# Patient Record
Sex: Male | Born: 1965
Health system: Southern US, Community
[De-identification: ages and names within clinical notes are randomized; demographics above are authoritative.]

## PROBLEM LIST (undated history)

## (undated) DIAGNOSIS — F1921 Other psychoactive substance dependence, in remission: Secondary | ICD-10-CM

## (undated) DIAGNOSIS — N183 Chronic kidney disease, stage 3 unspecified: Secondary | ICD-10-CM

## (undated) DIAGNOSIS — E119 Type 2 diabetes mellitus without complications: Secondary | ICD-10-CM

## (undated) DIAGNOSIS — M1990 Unspecified osteoarthritis, unspecified site: Secondary | ICD-10-CM

## (undated) DIAGNOSIS — F259 Schizoaffective disorder, unspecified: Secondary | ICD-10-CM

## (undated) DIAGNOSIS — Z87442 Personal history of urinary calculi: Secondary | ICD-10-CM

## (undated) DIAGNOSIS — F988 Other specified behavioral and emotional disorders with onset usually occurring in childhood and adolescence: Secondary | ICD-10-CM

## (undated) DIAGNOSIS — G709 Myoneural disorder, unspecified: Secondary | ICD-10-CM

## (undated) DIAGNOSIS — J90 Pleural effusion, not elsewhere classified: Secondary | ICD-10-CM

## (undated) DIAGNOSIS — E291 Testicular hypofunction: Secondary | ICD-10-CM

## (undated) DIAGNOSIS — F4310 Post-traumatic stress disorder, unspecified: Secondary | ICD-10-CM

## (undated) DIAGNOSIS — IMO0002 Reserved for concepts with insufficient information to code with codable children: Secondary | ICD-10-CM

## (undated) DIAGNOSIS — R319 Hematuria, unspecified: Secondary | ICD-10-CM

## (undated) DIAGNOSIS — J189 Pneumonia, unspecified organism: Secondary | ICD-10-CM

## (undated) DIAGNOSIS — Z8489 Family history of other specified conditions: Secondary | ICD-10-CM

## (undated) DIAGNOSIS — R7989 Other specified abnormal findings of blood chemistry: Secondary | ICD-10-CM

## (undated) DIAGNOSIS — M5416 Radiculopathy, lumbar region: Secondary | ICD-10-CM

## (undated) DIAGNOSIS — F0390 Unspecified dementia without behavioral disturbance: Secondary | ICD-10-CM

## (undated) DIAGNOSIS — F319 Bipolar disorder, unspecified: Secondary | ICD-10-CM

## (undated) DIAGNOSIS — F329 Major depressive disorder, single episode, unspecified: Secondary | ICD-10-CM

## (undated) DIAGNOSIS — F32A Depression, unspecified: Secondary | ICD-10-CM

## (undated) DIAGNOSIS — T50912A Poisoning by multiple unspecified drugs, medicaments and biological substances, intentional self-harm, initial encounter: Secondary | ICD-10-CM

## (undated) DIAGNOSIS — R296 Repeated falls: Secondary | ICD-10-CM

## (undated) DIAGNOSIS — I10 Essential (primary) hypertension: Secondary | ICD-10-CM

## (undated) DIAGNOSIS — K760 Fatty (change of) liver, not elsewhere classified: Secondary | ICD-10-CM

## (undated) DIAGNOSIS — T7840XA Allergy, unspecified, initial encounter: Secondary | ICD-10-CM

## (undated) DIAGNOSIS — F1910 Other psychoactive substance abuse, uncomplicated: Secondary | ICD-10-CM

## (undated) DIAGNOSIS — R3129 Other microscopic hematuria: Secondary | ICD-10-CM

## (undated) DIAGNOSIS — M1611 Unilateral primary osteoarthritis, right hip: Secondary | ICD-10-CM

## (undated) DIAGNOSIS — R42 Dizziness and giddiness: Secondary | ICD-10-CM

## (undated) DIAGNOSIS — M1612 Unilateral primary osteoarthritis, left hip: Secondary | ICD-10-CM

## (undated) DIAGNOSIS — F419 Anxiety disorder, unspecified: Secondary | ICD-10-CM

## (undated) HISTORY — DX: Testicular hypofunction: E29.1

## (undated) HISTORY — DX: Other psychoactive substance abuse, uncomplicated: F19.10

## (undated) HISTORY — DX: Depression, unspecified: F32.A

## (undated) HISTORY — DX: Anxiety disorder, unspecified: F41.9

## (undated) HISTORY — DX: Other specified behavioral and emotional disorders with onset usually occurring in childhood and adolescence: F98.8

## (undated) HISTORY — PX: BACK SURGERY: SHX140

## (undated) HISTORY — DX: Allergy, unspecified, initial encounter: T78.40XA

## (undated) HISTORY — DX: Repeated falls: R29.6

## (undated) HISTORY — DX: Essential (primary) hypertension: I10

## (undated) HISTORY — DX: Myoneural disorder, unspecified: G70.9

## (undated) HISTORY — DX: Other psychoactive substance dependence, in remission: F19.21

## (undated) HISTORY — DX: Dizziness and giddiness: R42

## (undated) HISTORY — PX: LUMBAR DISC SURGERY: SHX700

## (undated) HISTORY — DX: Radiculopathy, lumbar region: M54.16

## (undated) HISTORY — DX: Schizoaffective disorder, unspecified: F25.9

## (undated) HISTORY — DX: Other microscopic hematuria: R31.29

## (undated) HISTORY — DX: Major depressive disorder, single episode, unspecified: F32.9

## (undated) HISTORY — PX: TONSILLECTOMY: SUR1361

## (undated) HISTORY — PX: CLOSED REDUCTION METACARPAL WITH PERCUTANEOUS PINNING: SHX5613

---

## 1997-10-21 ENCOUNTER — Emergency Department (HOSPITAL_COMMUNITY): Admission: EM | Admit: 1997-10-21 | Discharge: 1997-10-21 | Payer: Self-pay | Admitting: Emergency Medicine

## 1998-06-24 ENCOUNTER — Encounter: Payer: Self-pay | Admitting: Emergency Medicine

## 1998-06-24 ENCOUNTER — Emergency Department (HOSPITAL_COMMUNITY): Admission: EM | Admit: 1998-06-24 | Discharge: 1998-06-24 | Payer: Self-pay | Admitting: Emergency Medicine

## 1999-11-05 ENCOUNTER — Emergency Department (HOSPITAL_COMMUNITY): Admission: EM | Admit: 1999-11-05 | Discharge: 1999-11-05 | Payer: Self-pay | Admitting: Emergency Medicine

## 2000-02-21 ENCOUNTER — Emergency Department (HOSPITAL_COMMUNITY): Admission: EM | Admit: 2000-02-21 | Discharge: 2000-02-22 | Payer: Self-pay | Admitting: Emergency Medicine

## 2000-02-22 ENCOUNTER — Encounter: Payer: Self-pay | Admitting: Emergency Medicine

## 2000-02-23 ENCOUNTER — Inpatient Hospital Stay (HOSPITAL_COMMUNITY): Admission: EM | Admit: 2000-02-23 | Discharge: 2000-02-24 | Payer: Self-pay | Admitting: Emergency Medicine

## 2000-02-23 ENCOUNTER — Encounter: Payer: Self-pay | Admitting: Neurosurgery

## 2000-07-20 ENCOUNTER — Emergency Department (HOSPITAL_COMMUNITY): Admission: EM | Admit: 2000-07-20 | Discharge: 2000-07-20 | Payer: Self-pay | Admitting: Emergency Medicine

## 2000-07-20 ENCOUNTER — Encounter: Payer: Self-pay | Admitting: Emergency Medicine

## 2000-12-31 ENCOUNTER — Encounter: Admission: RE | Admit: 2000-12-31 | Discharge: 2001-03-31 | Payer: Self-pay | Admitting: Internal Medicine

## 2001-01-14 ENCOUNTER — Encounter: Admission: RE | Admit: 2001-01-14 | Discharge: 2001-01-14 | Payer: Self-pay | Admitting: Internal Medicine

## 2001-01-14 ENCOUNTER — Encounter: Payer: Self-pay | Admitting: Internal Medicine

## 2002-05-25 ENCOUNTER — Encounter: Payer: Self-pay | Admitting: Neurosurgery

## 2002-05-27 ENCOUNTER — Inpatient Hospital Stay (HOSPITAL_COMMUNITY): Admission: RE | Admit: 2002-05-27 | Discharge: 2002-05-30 | Payer: Self-pay | Admitting: Neurosurgery

## 2002-05-27 ENCOUNTER — Encounter: Payer: Self-pay | Admitting: Neurosurgery

## 2004-02-07 ENCOUNTER — Ambulatory Visit: Payer: Self-pay | Admitting: Internal Medicine

## 2004-02-15 ENCOUNTER — Ambulatory Visit (HOSPITAL_COMMUNITY): Admission: RE | Admit: 2004-02-15 | Discharge: 2004-02-15 | Payer: Self-pay | Admitting: Internal Medicine

## 2004-02-29 ENCOUNTER — Ambulatory Visit: Payer: Self-pay | Admitting: Psychology

## 2004-03-07 ENCOUNTER — Ambulatory Visit: Payer: Self-pay | Admitting: Internal Medicine

## 2004-06-05 ENCOUNTER — Ambulatory Visit: Payer: Self-pay | Admitting: Internal Medicine

## 2004-06-11 ENCOUNTER — Ambulatory Visit: Payer: Self-pay | Admitting: Internal Medicine

## 2004-08-14 ENCOUNTER — Ambulatory Visit: Payer: Self-pay | Admitting: Internal Medicine

## 2004-08-15 ENCOUNTER — Ambulatory Visit: Payer: Self-pay | Admitting: Internal Medicine

## 2004-08-21 ENCOUNTER — Ambulatory Visit: Payer: Self-pay | Admitting: Internal Medicine

## 2004-10-02 ENCOUNTER — Ambulatory Visit: Payer: Self-pay | Admitting: Internal Medicine

## 2004-12-04 ENCOUNTER — Ambulatory Visit: Payer: Self-pay | Admitting: Internal Medicine

## 2005-12-03 ENCOUNTER — Ambulatory Visit: Payer: Self-pay | Admitting: Internal Medicine

## 2005-12-09 ENCOUNTER — Ambulatory Visit (HOSPITAL_COMMUNITY): Admission: RE | Admit: 2005-12-09 | Discharge: 2005-12-09 | Payer: Self-pay | Admitting: Internal Medicine

## 2005-12-11 ENCOUNTER — Ambulatory Visit: Payer: Self-pay | Admitting: Internal Medicine

## 2005-12-18 ENCOUNTER — Ambulatory Visit: Payer: Self-pay | Admitting: Internal Medicine

## 2005-12-20 ENCOUNTER — Ambulatory Visit (HOSPITAL_COMMUNITY): Admission: RE | Admit: 2005-12-20 | Discharge: 2005-12-20 | Payer: Self-pay | Admitting: Internal Medicine

## 2005-12-26 ENCOUNTER — Encounter: Payer: Self-pay | Admitting: Internal Medicine

## 2006-09-22 ENCOUNTER — Ambulatory Visit: Payer: Self-pay | Admitting: Internal Medicine

## 2006-10-09 ENCOUNTER — Emergency Department (HOSPITAL_COMMUNITY): Admission: EM | Admit: 2006-10-09 | Discharge: 2006-10-09 | Payer: Self-pay | Admitting: Emergency Medicine

## 2006-12-05 ENCOUNTER — Encounter: Payer: Self-pay | Admitting: Internal Medicine

## 2006-12-05 DIAGNOSIS — I10 Essential (primary) hypertension: Secondary | ICD-10-CM

## 2006-12-05 DIAGNOSIS — F411 Generalized anxiety disorder: Secondary | ICD-10-CM | POA: Insufficient documentation

## 2006-12-11 ENCOUNTER — Ambulatory Visit: Payer: Self-pay | Admitting: *Deleted

## 2006-12-11 ENCOUNTER — Encounter (INDEPENDENT_AMBULATORY_CARE_PROVIDER_SITE_OTHER): Payer: Self-pay | Admitting: Internal Medicine

## 2006-12-11 ENCOUNTER — Ambulatory Visit: Payer: Self-pay | Admitting: Family Medicine

## 2006-12-11 LAB — CONVERTED CEMR LAB
ALT: 13 units/L (ref 0–53)
Albumin: 4.2 g/dL (ref 3.5–5.2)
Alkaline Phosphatase: 80 units/L (ref 39–117)
BUN: 14 mg/dL (ref 6–23)
Basophils Relative: 1 % (ref 0–1)
CO2: 27 meq/L (ref 19–32)
Chloride: 109 meq/L (ref 96–112)
HCT: 49.1 % (ref 39.0–52.0)
HDL: 29 mg/dL — ABNORMAL LOW (ref >39)
Hemoglobin: 17.4 g/dL — ABNORMAL HIGH (ref 13.0–17.0)
Lymphocytes Relative: 26 % (ref 12–46)
MCHC: 35.4 g/dL (ref 30.0–36.0)
MCV: 87.5 fL (ref 78.0–100.0)
Monocytes Relative: 8 % (ref 3–11)
Neutro Abs: 6.4 10*3/uL (ref 1.7–7.7)
Neutrophils Relative %: 65 % (ref 43–77)
Platelets: 185 10*3/uL (ref 150–400)
RBC: 5.61 M/uL (ref 4.22–5.81)
RDW: 13.3 % (ref 11.5–14.0)
Sodium: 143 meq/L (ref 135–145)
Total CHOL/HDL Ratio: 4.8
VLDL: 27 mg/dL (ref 0–40)

## 2007-01-01 ENCOUNTER — Ambulatory Visit: Payer: Self-pay | Admitting: Internal Medicine

## 2007-04-02 ENCOUNTER — Ambulatory Visit: Payer: Self-pay | Admitting: Internal Medicine

## 2007-04-19 ENCOUNTER — Ambulatory Visit: Payer: Self-pay | Admitting: Internal Medicine

## 2007-04-25 ENCOUNTER — Inpatient Hospital Stay (HOSPITAL_COMMUNITY): Admission: EM | Admit: 2007-04-25 | Discharge: 2007-05-04 | Payer: Self-pay | Admitting: Emergency Medicine

## 2007-04-25 ENCOUNTER — Ambulatory Visit: Payer: Self-pay | Admitting: Pulmonary Disease

## 2007-04-25 ENCOUNTER — Ambulatory Visit: Payer: Self-pay | Admitting: Cardiology

## 2007-04-27 ENCOUNTER — Encounter (INDEPENDENT_AMBULATORY_CARE_PROVIDER_SITE_OTHER): Payer: Self-pay | Admitting: Internal Medicine

## 2007-05-04 ENCOUNTER — Inpatient Hospital Stay (HOSPITAL_COMMUNITY): Admission: RE | Admit: 2007-05-04 | Discharge: 2007-05-07 | Payer: Self-pay | Admitting: Psychiatry

## 2007-05-04 ENCOUNTER — Ambulatory Visit: Payer: Self-pay | Admitting: Psychiatry

## 2009-06-06 ENCOUNTER — Emergency Department (HOSPITAL_COMMUNITY): Admission: EM | Admit: 2009-06-06 | Discharge: 2009-06-06 | Payer: Self-pay | Admitting: Emergency Medicine

## 2009-10-22 ENCOUNTER — Emergency Department (HOSPITAL_COMMUNITY): Admission: EM | Admit: 2009-10-22 | Discharge: 2009-10-22 | Payer: Self-pay | Admitting: Family Medicine

## 2010-01-18 ENCOUNTER — Ambulatory Visit: Payer: Self-pay | Admitting: Internal Medicine

## 2010-01-18 DIAGNOSIS — F329 Major depressive disorder, single episode, unspecified: Secondary | ICD-10-CM

## 2010-01-18 DIAGNOSIS — M25511 Pain in right shoulder: Secondary | ICD-10-CM

## 2010-01-18 DIAGNOSIS — R3129 Other microscopic hematuria: Secondary | ICD-10-CM | POA: Insufficient documentation

## 2010-01-18 DIAGNOSIS — F250 Schizoaffective disorder, bipolar type: Secondary | ICD-10-CM

## 2010-01-22 ENCOUNTER — Telehealth: Payer: Self-pay | Admitting: Internal Medicine

## 2010-01-22 LAB — CONVERTED CEMR LAB
AST: 36 units/L (ref 0–37)
Albumin: 4.7 g/dL (ref 3.5–5.2)
Basophils Relative: 0.6 % (ref 0.0–3.0)
Bilirubin Urine: NEGATIVE
Bilirubin, Direct: 0.1 mg/dL (ref 0.0–0.3)
Calcium: 9.5 mg/dL (ref 8.4–10.5)
Chloride: 104 meq/L (ref 96–112)
Eosinophils Relative: 1.8 % (ref 0.0–5.0)
GFR calc non Af Amer: 76.38 mL/min (ref >60)
Glucose, Bld: 130 mg/dL — ABNORMAL HIGH (ref 70–99)
HCT: 44.1 % (ref 39.0–52.0)
Hemoglobin: 15.3 g/dL (ref 13.0–17.0)
Ketones, ur: NEGATIVE mg/dL
Lymphs Abs: 2 10*3/uL (ref 0.7–4.0)
MCV: 92.1 fL (ref 78.0–100.0)
Monocytes Relative: 6.5 % (ref 3.0–12.0)
Nitrite: NEGATIVE
RBC: 4.79 M/uL (ref 4.22–5.81)
RDW: 13.1 % (ref 11.5–14.6)
Sodium: 140 meq/L (ref 135–145)
Specific Gravity, Urine: 1.015 (ref 1.000–1.030)
Total Bilirubin: 0.7 mg/dL (ref 0.3–1.2)
Total Protein, Urine: 30 mg/dL
Total Protein: 7.6 g/dL (ref 6.0–8.3)
pH: 6.5 (ref 5.0–8.0)

## 2010-05-20 ENCOUNTER — Telehealth: Payer: Self-pay | Admitting: Internal Medicine

## 2010-05-20 ENCOUNTER — Other Ambulatory Visit: Payer: Self-pay | Admitting: Internal Medicine

## 2010-05-20 ENCOUNTER — Ambulatory Visit
Admission: RE | Admit: 2010-05-20 | Discharge: 2010-05-20 | Payer: Self-pay | Source: Home / Self Care | Attending: Internal Medicine | Admitting: Internal Medicine

## 2010-05-20 DIAGNOSIS — F17200 Nicotine dependence, unspecified, uncomplicated: Secondary | ICD-10-CM | POA: Insufficient documentation

## 2010-05-20 DIAGNOSIS — E291 Testicular hypofunction: Secondary | ICD-10-CM | POA: Insufficient documentation

## 2010-05-20 DIAGNOSIS — R453 Demoralization and apathy: Secondary | ICD-10-CM | POA: Insufficient documentation

## 2010-05-20 LAB — TESTOSTERONE: Testosterone: 247.07 ng/dL — ABNORMAL LOW (ref 350.00–890.00)

## 2010-05-23 NOTE — Progress Notes (Signed)
Summary: FYI  Phone Note Outgoing Call   Call placed by: Jonathon Resides, [facility]),  January 22, 2010 11:57 AM Summary of Call: called pt to inform him of recent labs and I asked him if it was ok to sched urology appt.  He stated he has seen urologist in past and does not wish to see another urologist at this time.  He states microscopic hematuria is hereditary and normal for him  Follow-up for Phone Call        Noted Thx Follow-up by: [name] MD,  January 22, 2010 1:21 PM

## 2010-05-23 NOTE — Assessment & Plan Note (Signed)
Summary: LAST APPT W/DR AVP:  2008----OV---STC   Vital Signs:  Patient profile:   45 year old male Height:      77 inches Weight:      244 pounds BMI:     29.04 Temp:     98.4 degrees F oral Pulse rate:   92 / minute Pulse rhythm:   regular Resp:     16 per minute BP sitting:   150 / 98  (left arm) Cuff size:   regular  Vitals Entered By: Jonathon Resides, [facility]) (January 18, 2010 3:09 PM) CC: est PCP. c/o rt shoulder pain Is Patient Diabetic? No   CC:  est PCP. c/o rt shoulder pain.  History of Present Illness: New pt - re-est The patient presents for a preventive health examination   Preventive Screening-Counseling & Management  Alcohol-Tobacco     Alcohol drinks/day: 0     Smoking Status: current     Packs/Day: 1.0  Caffeine-Diet-Exercise     Does Patient Exercise: no  Comments: wants to quit  Current Medications (verified): 1)  Wellbutrin Sr 150 Mg Xr12h-Tab (Bupropion Hcl) .... 2 Once Daily 2)  Seroquel 300 Mg Tabs (Quetiapine Fumarate) .... At Bedtime 3)  Klonopin 2 Mg Tabs (Clonazepam) .Marland Kitchen.. 1 Three Times A Day  Allergies (verified): No Known Drug Allergies  Past History:  Past Medical History: Anxiety Hypertension Depression - bipolar [facility] ADD Microsc hematuria hereditory s/p Urol eval  Past Surgical History: Denies surgical history  Family History: M died of melanoma with mets F DM S cervical CA, DM  Social History: Occupation: Multimedia programmer Current Smoker Alcohol use-no Regular exercise-no Single Smoking Status:  current Packs/Day:  1.0 Does Patient Exercise:  no  Review of Systems       The patient complains of depression.  The patient denies anorexia, fever, weight loss, weight gain, vision loss, decreased hearing, hoarseness, chest pain, syncope, dyspnea on exertion, peripheral edema, prolonged cough, headaches, hemoptysis, abdominal pain, melena, hematochezia, severe indigestion/heartburn, hematuria,  incontinence, genital sores, muscle weakness, suspicious skin lesions, transient blindness, difficulty walking, unusual weight change, abnormal bleeding, enlarged lymph nodes, angioedema, and testicular masses.    Physical Exam  General:  Well-developed,well-nourished,in no acute distress; alert,appropriate and cooperative throughout examination Head:  Normocephalic and atraumatic without obvious abnormalities. No apparent alopecia or balding. Eyes:  No corneal or conjunctival inflammation noted. EOMI. Perrla. Ears:  External ear exam shows no significant lesions or deformities.  Otoscopic examination reveals clear canals, tympanic membranes are intact bilaterally without bulging, retraction, inflammation or discharge. Hearing is grossly normal bilaterally. Nose:  External nasal examination shows no deformity or inflammation. Nasal mucosa are pink and moist without lesions or exudates. Mouth:  Oral mucosa and oropharynx without lesions or exudates.  Teeth in good repair. Neck:  No deformities, masses, or tenderness noted. Lungs:  Normal respiratory effort, chest expands symmetrically. Lungs are clear to auscultation, no crackles or wheezes. Heart:  Normal rate and regular rhythm. S1 and S2 normal without gallop, murmur, click, rub or other extra sounds. Abdomen:  Bowel sounds positive,abdomen soft and non-tender without masses, organomegaly or hernias noted. Msk:  No deformity or scoliosis noted of thoracic or lumbar spine.   Pulses:  R and L carotid,radial,femoral,dorsalis pedis and posterior tibial pulses are full and equal bilaterally Extremities:  No clubbing, cyanosis, edema, or deformity noted with normal full range of motion of all joints.   Neurologic:  No cranial nerve deficits noted. Station and gait are normal. Plantar reflexes  are down-going bilaterally. DTRs are symmetrical throughout. Sensory, motor and coordinative functions appear intact. Skin:  Intact without suspicious lesions or  rashes Cervical Nodes:  No lymphadenopathy noted Inguinal Nodes:  No significant adenopathy Psych:  Cognition and judgment appear intact. Alert and cooperative with normal attention span and concentration. No apparent delusions, illusions, hallucinations   Impression & Recommendations:  Problem # 1:  HEALTH MAINTENANCE EXAM (ICD-V70.0) Assessment New Health and age related issues were discussed. Available screening tests and vaccinations were discussed as well. Healthy life style including good diet and exercise was discussed.  Orders: T-Vitamin D (25-Hydroxy) [phone]) TLB-B12, Serum-Total ONLY [id number]) TLB-BMP (Basic Metabolic Panel-BMET) (99991111) TLB-CBC Platelet - w/Differential (85025-CBCD) TLB-Hepatic/Liver Function Pnl (80076-HEPATIC) TLB-Lipid Panel (80061-LIPID) TLB-TSH (Thyroid Stimulating Hormone) (84443-TSH) TLB-Udip ONLY (81003-UDIP)  Problem # 2:  SHOULDER PAIN (ICD-719.41) R bicip tendonitis Assessment: New Pennsaid  Problem # 3:  HYPERTENSION (ICD-401.9) Assessment: Comment Only  Orders: T-Vitamin D (25-Hydroxy) [phone]) TLB-B12, Serum-Total ONLY [id number]) TLB-BMP (Basic Metabolic Panel-BMET) (99991111) TLB-CBC Platelet - w/Differential (85025-CBCD) TLB-Hepatic/Liver Function Pnl (80076-HEPATIC) TLB-Lipid Panel (80061-LIPID) TLB-TSH (Thyroid Stimulating Hormone) (84443-TSH) TLB-Udip ONLY (81003-UDIP)  Problem # 4:  ANXIETY (ICD-300.00) Assessment: Unchanged  His updated medication list for this problem includes:    Wellbutrin Sr 150 Mg Xr12h-tab (Bupropion hcl) .Marland Kitchen... 2 once daily    Klonopin 2 Mg Tabs (Clonazepam) .Marland Kitchen... 1 three times a day    Hydroxyzine Hcl 25 Mg Tabs (Hydroxyzine hcl) .Marland Kitchen... 1-2 at hs as needed insomnia  Problem # 5:  ELEVATED BLOOD PRESSURE (ICD-796.2) Assessment: Comment Only Call if >130/85  Problem # 6:  BIPOLAR DISORDER UNSPECIFIED (ICD-296.80) Assessment: Unchanged On the regimen of medicine(s)  reflected in the chart    Problem # 7:  MICROSCOPIC HEMATURIA (ICD-599.72) s/p Urol w/up Assessment: Comment Only see lab report addendum and tel note  Complete Medication List: 1)  Wellbutrin Sr 150 Mg Xr12h-tab (Bupropion hcl) .... 2 once daily 2)  Seroquel 300 Mg Tabs (Quetiapine fumarate) .... At bedtime 3)  Klonopin 2 Mg Tabs (Clonazepam) .Marland Kitchen.. 1 three times a day 4)  Vitamin D 1000 Unit Tabs (Cholecalciferol) .Marland Kitchen.. 1 by mouth qd 5)  Hydroxyzine Hcl 25 Mg Tabs (Hydroxyzine hcl) .Marland Kitchen.. 1-2 at hs as needed insomnia 6)  Pennsaid 1.5 % Soln (Diclofenac sodium) .... 3-5 gtt on skin three times a day for pain 7)  Vitamin B-12 500 Mcg Tabs (Cyanocobalamin) .Marland Kitchen.. 1 by mouth once daily  Other Orders: Admin 1st Vaccine [id number]) Flu Vaccine 37yrs + [id number])  Patient Instructions: 1)  Please schedule a follow-up appointment in 6 months. 2)  Call if BP is up Prescriptions: PENNSAID 1.5 % SOLN (DICLOFENAC SODIUM) 3-5 gtt on skin three times a day for pain  #1 x 3   Entered and Authorized by:   [name] MD   Signed by:   [name] MD on 01/18/2010   Method used:   Print then Give to Patient   RxID:   [id number] HYDROXYZINE HCL 25 MG TABS (HYDROXYZINE HCL) 1-2 at hs as needed insomnia  #60 x 3   Entered and Authorized by:   [name] MD   Signed by:   [name] MD on 01/18/2010   Method used:   Print then Give to Patient   RxID:   [id number] KLONOPIN 2 MG TABS (CLONAZEPAM) 1 three times a day  #90 x 3   Entered and Authorized by:   [name] MD   Signed by:  [name] MD on 01/18/2010   Method used:   Print then Give to Patient   RxID:   [id number] SEROQUEL 300 MG TABS (QUETIAPINE FUMARATE) at bedtime  #30 x 6   Entered and Authorized by:   [name] MD   Signed by:   [name] MD on 01/18/2010   Method used:   Print then Give to Patient   RxID:   [id number] WELLBUTRIN SR 150 MG XR12H-TAB  (BUPROPION HCL) 2 once daily  #60 x 6   Entered and Authorized by:   [name] MD   Signed by:   [name] MD on 01/18/2010   Method used:   Print then Give to Patient   RxIDIP:[id number]   .lbflu   Flu Vaccine Consent Questions     Do you have a history of severe allergic reactions to this vaccine? no    Any prior history of allergic reactions to egg and/or gelatin? no    Do you have a sensitivity to the preservative Thimersol? no    Do you have a past history of Guillan-Barre Syndrome? no    Do you currently have an acute febrile illness? no    Have you ever had a severe reaction to latex? no    Vaccine information given and explained to patient? yes    Are you currently pregnant? no    Lot Number:[id number]   Exp Date:10/19/2010   Site Given  Right Deltoid IM Jonathon Resides, [facility])  January 18, 2010 3:39 PM

## 2010-05-29 NOTE — Miscellaneous (Signed)
Summary: [facility]   Imported By: Phillis Knack 05/24/2010 08:55:42  _____________________________________________________________________  External Attachment:    Type:   Image     Comment:   External Document

## 2010-05-29 NOTE — Progress Notes (Signed)
Summary: Chantix not covered  Phone Note Call from Patient   Summary of Call: Pt was given coupon for chantix but no rx. Needs to go to [facility]. Initial call taken by: Charlsie Quest, [place],  May 20, 2010 3:26 PM  Follow-up for Phone Call        Is he going to take it? Read info.  Wait x 1 month after you switch to Olanzapine before you try Chantix?  Follow-up by: [name] MD,  May 20, 2010 6:19 PM  Additional Follow-up for Phone Call Additional follow up Details #1::        left mess for pt to call back to advise of above and append on labs Additional Follow-up by: Jonathon Resides, [facility]),  May 21, 2010 9:32 AM    Additional Follow-up for Phone Call Additional follow up Details #2::    left mess for pt to call back  Jonathon Resides, [facility])  May 22, 2010 8:27 AM   Additional Follow-up for Phone Call Additional follow up Details #3:: Details for Additional Follow-up Action Taken: pt informed of above and states he will use nicotine patch for now because Chantix is not covered by ins.  Noted. Do it no sooner than in 2 wks after you switch to Olanzepine. Thank you!   Pt informed.............Marland KitchenCharlsie Quest, CMA  May 22, 2010 5:29 PM  Additional Follow-up by: Jonathon Resides, [facility]),  May 22, 2010 4:59 PM

## 2010-05-29 NOTE — Assessment & Plan Note (Signed)
Summary: f/u appt/cortisone injection/cd   Vital Signs:  Patient profile:   45 year old male Height:      77 inches Weight:      256 pounds BMI:     30.47 Temp:     98.8 degrees F oral Pulse rate:   104 / minute Pulse rhythm:   regular Resp:     16 per minute BP sitting:   112 / 88  (left arm) Cuff size:   regular  Vitals Entered By: [name], CMA(AAMA) (May 20, 2010 2:15 PM)  Procedure Note  Injections: The patient complains of pain. Indication: chronic pain Consent signed: yes  Procedure # 1: trigger point injection    Region: anterior    Location: R deltoid    Technique: 25 g needle    Medication: 40 mg depomedrol    Anesthesia: 3.0 ml 1% lidocaine w/o epinephrine    Comment: Risks including but not limited by incomplete procedure, bleeding, infection, recurrence were discussed with the patient. Consent form was signed. 3 points wer injected with 1/3 of the mix. Tolerated well. Complicatons - none. Good pain relief following the procedure.   Cleaned and prepped with: alcohol and betadine Wound dressing: bandaid  CC: f/u c/o Rt shoulder pain, Rt foot pain Is Patient Diabetic? No Comments pt states Wellbutrin is no longer working and he needs cheaper alt for Seroquel   CC:  f/u c/o Rt shoulder pain and Rt foot pain.  History of Present Illness: The patient presents for a follow up of anxiety, depression. C/o apathy and fatigue. He wants to stop smoking. He is c/o R shoulder pain worse with movements, lifting. C/o Seroquel cost - needs to switch to smthg else...  Current Medications (verified): 1)  Wellbutrin Sr 150 Mg Xr12h-Tab (Bupropion Hcl) .... 2 Once Daily 2)  Seroquel 300 Mg Tabs (Quetiapine Fumarate) .... At Bedtime 3)  Klonopin 2 Mg Tabs (Clonazepam) .Marland Kitchen.. 1 Three Times A Day 4)  Vitamin D 1000 Unit Tabs (Cholecalciferol) .Marland Kitchen.. 1 By Mouth Qd 5)  Hydroxyzine Hcl 25 Mg Tabs (Hydroxyzine Hcl) .Marland Kitchen.. 1-2 At [facility] As Needed Insomnia 6)  Pennsaid 1.5 % Soln  (Diclofenac Sodium) .... 3-5 Gtt On Skin Three Times A Day For Pain 7)  Vitamin B-12 500 Mcg Tabs (Cyanocobalamin) .Marland Kitchen.. 1 By Mouth Once Daily  Allergies (verified): No Known Drug Allergies  Past History:  Past Medical History: Last updated: 01/18/2010 Anxiety Hypertension Depression - bipolar [name] s/p Urol eval  Social History: Last updated: 01/18/2010 Occupation: Multimedia programmer Current Smoker Alcohol use-no Regular exercise-no Single  Review of Systems       The patient complains of depression.  The patient denies fever, weight gain, chest pain, abdominal pain, melena, and muscle weakness.    Physical Exam  General:  Well-developed, in no acute distress; alert, appropriate and cooperative throughout examination overweight-appearing.   Head:  Normocephalic and atraumatic without obvious abnormalities. No apparent alopecia or balding. Nose:  External nasal examination shows no deformity or inflammation. Nasal mucosa are pink and moist without lesions or exudates. Mouth:  Oral mucosa and oropharynx without lesions or exudates.  Teeth in good repair. Lungs:  Normal respiratory effort, chest expands symmetrically. Lungs are clear to auscultation, no crackles or wheezes. Heart:  Normal rate and regular rhythm. S1 and S2 normal without gallop, murmur, click, rub or other extra sounds. Abdomen:  Bowel sounds positive,abdomen soft and non-tender without masses, organomegaly or hernias noted. Msk:  No  deformity or scoliosis noted of thoracic or lumbar spine.  R deltoid is tender in 3 points. Subacr space bicipital tendon are NT Extremities:  No clubbing, cyanosis, edema, or deformity noted with normal full range of motion of all joints.   Neurologic:  alert & oriented X3 and gait normal.   Skin:  Intact without suspicious lesions or rashes Psych:  Cognition and judgment appear intact. Alert and cooperative with normal attention span and  concentration. No apparent delusions, illusions, hallucinationsOriented X3, memory intact for recent and remote, good eye contact, not agitated, not suicidal, not homicidal, and flat affect.     Impression & Recommendations:  Problem # 1:  SHOULDER PAIN (ICD-719.41) R Assessment Deteriorated  Trigger point inj x 3  His updated medication list for this problem includes:    Naproxen 500 Mg Tabs (Naproxen) .Marland Kitchen... 1 by mouth two times a day pc for pain/arthritis    Cyclobenzaprine Hcl 5 Mg Tabs (Cyclobenzaprine hcl) .Marland Kitchen... 1 by mouth two times a day as needed spasm  Problem # 2:  BIPOLAR DISORDER UNSPECIFIED (ICD-296.80) Assessment: Unchanged Will switch to Zyprexa due to cost Risks vs benefits of Olanzepine use were discussed.   Problem # 3:  HYPERTENSION (ICD-401.9) Assessment: Improved  BP today: 112/88 Prior BP: 150/98 (01/18/2010)  Labs Reviewed: K+: 4.3 (01/18/2010) Creat: : 1.1 (01/18/2010)   Chol: 182 (01/18/2010)   HDL: 31.00 (01/18/2010)   LDL: 84 (12/11/2006)   TG: 258.0 (01/18/2010)  Problem # 4:  ANXIETY (ICD-300.00) Assessment: Unchanged  His updated medication list for this problem includes:    Wellbutrin Sr 150 Mg Xr12h-tab (Bupropion hcl) .Marland Kitchen... 2 once daily    Klonopin 2 Mg Tabs (Clonazepam) .Marland Kitchen... 1 three times a day    Hydroxyzine Hcl 25 Mg Tabs (Hydroxyzine hcl) .Marland Kitchen... 1-2 at hs as needed insomnia  Problem # 5:  DEMORALIZATION AND APATHY (ICD-799.25) Assessment: New Check testosterone level  Problem # 6:  HYPOGONADISM (ICD-257.2) Assessment: New Will see him back to discuss Rx - see tel note  Problem # 7:  TOBACCO USER (ICD-305.1) Assessment: Unchanged Risks vs benefits and controversies of Chantix use were discussed. See "Patient Instructions". See tel note. His updated medication list for this problem includes:    Chantix Starting Month Pak 0.5 Mg X 11 & 1 Mg X 42 Tabs (Varenicline tartrate) .Marland Kitchen... As dirrected    Chantix Continuing Month Pak 1 Mg Tabs  (Varenicline tartrate) .Marland Kitchen... As dirrected  Encouraged smoking cessation and discussed different methods for smoking cessation.   Complete Medication List: 1)  Wellbutrin Sr 150 Mg Xr12h-tab (Bupropion hcl) .... 2 once daily 2)  Klonopin 2 Mg Tabs (Clonazepam) .Marland Kitchen.. 1 three times a day 3)  Vitamin D 1000 Unit Tabs (Cholecalciferol) .Marland Kitchen.. 1 by mouth qd 4)  Hydroxyzine Hcl 25 Mg Tabs (Hydroxyzine hcl) .Marland Kitchen.. 1-2 at hs as needed insomnia 5)  Pennsaid 1.5 % Soln (Diclofenac sodium) .... 3-5 gtt on skin three times a day for pain 6)  Vitamin B-12 500 Mcg Tabs (Cyanocobalamin) .Marland Kitchen.. 1 by mouth once daily 7)  Olanzapine 15 Mg Tbdp (Olanzapine) .Marland Kitchen.. 1 by mouth qhs 8)  Naproxen 500 Mg Tabs (Naproxen) .Marland Kitchen.. 1 by mouth two times a day pc for pain/arthritis 9)  Cyclobenzaprine Hcl 5 Mg Tabs (Cyclobenzaprine hcl) .Marland Kitchen.. 1 by mouth two times a day as needed spasm 10)  Chantix Starting Month Pak 0.5 Mg X 11 & 1 Mg X 42 Tabs (Varenicline tartrate) .... As dirrected 11)  Chantix Continuing Month Pak  1 Mg Tabs (Varenicline tartrate) .... As dirrected  Other Orders: TLB-Testosterone, Total (84403-TESTO) Trigger Point Injection (1 or 2 muscles) [id number]) Depo- Medrol 40mg  (J1030)  Patient Instructions: 1)  Please schedule a follow-up appointment in 2 months. Prescriptions: CHANTIX CONTINUING MONTH PAK 1 MG TABS (VARENICLINE TARTRATE) as dirrected  #1 x 5   Entered and Authorized by:   [name] MD   Signed by:   [name] MD on 05/20/2010   Method used:   Print then Give to Patient   RxID:   [id number] [place] PAK 0.5 MG X 11 & 1 MG X 42 TABS (VARENICLINE TARTRATE) as dirrected  #1 x 0   Entered and Authorized by:   [name] MD   Signed by:   [name] MD on 05/20/2010   Method used:   Print then Give to Patient   RxID:   [id number] CYCLOBENZAPRINE HCL 5 MG TABS (CYCLOBENZAPRINE HCL) 1 by mouth two times a day as needed spasm  #60 x 3   Entered  and Authorized by:   [name] MD   Signed by:   [name] MD on 05/20/2010   Method used:   Print then Give to Patient   RxID:   [id number] NAPROXEN 500 MG TABS (NAPROXEN) 1 by mouth two times a day pc for pain/arthritis  #60 x 3   Entered and Authorized by:   [name] MD   Signed by:   [name] MD on 05/20/2010   Method used:   Print then Give to Patient   RxID:   [id number] KLONOPIN 2 MG TABS (CLONAZEPAM) 1 three times a day  #90 x 3   Entered and Authorized by:   [name] MD   Signed by:   [name] MD on 05/20/2010   Method used:   Print then Give to Patient   RxID:   [id number] OLANZAPINE 15 MG TBDP (OLANZAPINE) 1 by mouth qhs  #30 x 6   Entered and Authorized by:   [name] MD   Signed by:   [name] MD on 05/20/2010   Method used:   Print then Give to Patient   RxID:   [id number]    Orders Added: 1)  TLB-Testosterone, Total [84403-TESTO] 2)  Est. Patient Level V [id number] 3)  Trigger Point Injection (1 or 2 muscles) [id number] 4)  Depo- Medrol 40mg  [id number]

## 2010-06-10 ENCOUNTER — Encounter: Payer: Self-pay | Admitting: Internal Medicine

## 2010-06-10 ENCOUNTER — Ambulatory Visit (INDEPENDENT_AMBULATORY_CARE_PROVIDER_SITE_OTHER): Payer: BC Managed Care – PPO | Admitting: Internal Medicine

## 2010-06-10 DIAGNOSIS — F319 Bipolar disorder, unspecified: Secondary | ICD-10-CM

## 2010-06-10 DIAGNOSIS — Z733 Stress, not elsewhere classified: Secondary | ICD-10-CM

## 2010-06-10 DIAGNOSIS — R3129 Other microscopic hematuria: Secondary | ICD-10-CM

## 2010-06-10 DIAGNOSIS — E291 Testicular hypofunction: Secondary | ICD-10-CM

## 2010-06-12 ENCOUNTER — Telehealth (INDEPENDENT_AMBULATORY_CARE_PROVIDER_SITE_OTHER): Payer: Self-pay | Admitting: *Deleted

## 2010-06-13 ENCOUNTER — Encounter: Payer: Self-pay | Admitting: Internal Medicine

## 2010-06-18 NOTE — Medication Information (Signed)
Summary: Prior Authorization Approved for [employer] [place]  Prior Authorization Approved for [employer] [place] [employer] [place]   Imported By: Rise Patience 06/14/2010 15:28:01  _____________________________________________________________________  External Attachment:    Type:   Image     Comment:   External Document

## 2010-06-18 NOTE — Progress Notes (Signed)
Summary: PA-Androgel  Phone Note From Pharmacy   Summary of Call: PA-Androgel form to Dr [name] to complete. [name]  June 12, 2010 4:21 PM Faxed to [facility] @ [phone], waiting approval. [name]  June 13, 2010 8:20 AM PA approved 06/13/10 - 06/13/11, pt aware. Initial call taken by: [name],  June 13, 2010 8:45 AM

## 2010-06-18 NOTE — Letter (Signed)
Summary: Generic Letter  [facility]   [place], [place] [address]   Phone: [phone]  Fax: [phone]    06/10/2010  [id number] [address], [place]  [address]  [place]    Mr. Moulder has to have at least 7-8 hours of sleep between the shifts due to his medical condition and the medicines he has to take.           Sincerely,   Lew Dawes MD

## 2010-06-18 NOTE — Assessment & Plan Note (Signed)
Summary: f/u appt per stacy/#   Vital Signs:  Patient profile:   45 year old male Height:      77 inches Weight:      256 pounds BMI:     30.47 Temp:     98.9 degrees F oral Pulse rate:   88 / minute Pulse rhythm:   regular Resp:     16 per minute BP sitting:   146 / 90  (left arm) Cuff size:   regular  Vitals Entered By: Jonathon Resides, CMA(AAMA) (June 10, 2010 3:55 PM) CC: f/u to discuss low testosterone Is Patient Diabetic? No   CC:  f/u to discuss low testosterone.  History of Present Illness: The patient presents for a follow up of abn labs and with c/o  back pain, anxiety, depression. He has not been getting enough sleep.  Current Medications (verified): 1)  Wellbutrin Sr 150 Mg Xr12h-Tab (Bupropion Hcl) .... 2 Once Daily 2)  Klonopin 2 Mg Tabs (Clonazepam) .Marland Kitchen.. 1 Three Times A Day 3)  Vitamin D 1000 Unit Tabs (Cholecalciferol) .Marland Kitchen.. 1 By Mouth Qd 4)  Hydroxyzine Hcl 25 Mg Tabs (Hydroxyzine Hcl) .Marland Kitchen.. 1-2 At [facility] As Needed Insomnia 5)  Pennsaid 1.5 % Soln (Diclofenac Sodium) .... 3-5 Gtt On Skin Three Times A Day For Pain 6)  Vitamin B-12 500 Mcg Tabs (Cyanocobalamin) .Marland Kitchen.. 1 By Mouth Once Daily 7)  Olanzapine 15 Mg Tbdp (Olanzapine) .Marland Kitchen.. 1 By Mouth Qhs 8)  Naproxen 500 Mg Tabs (Naproxen) .Marland Kitchen.. 1 By Mouth Two Times A Day Pc For Pain/arthritis 9)  Cyclobenzaprine Hcl 5 Mg Tabs (Cyclobenzaprine Hcl) .Marland Kitchen.. 1 By Mouth Two Times A Day As Needed Spasm 10)  Chantix Starting Month Pak 0.5 Mg X 11 & 1 Mg X 42 Tabs (Varenicline Tartrate) .... As Dirrected 11)  Chantix Continuing Month Pak 1 Mg Tabs (Varenicline Tartrate) .... As Dirrected  Allergies (verified): No Known Drug Allergies  Past History:  Past Medical History: Anxiety Hypertension Depression - bipolar [place] ADD Microsc hematuria hereditory s/p Urol eval Hypogonadism  Family History: Reviewed history from 01/18/2010 and no changes required. M died of melanoma with mets F DM S cervical CA,  DM  Social History: Reviewed history from 01/18/2010 and no changes required. Occupation: Multimedia programmer Current Smoker Alcohol use-no Regular exercise-no Single  Review of Systems       The patient complains of depression.  The patient denies anorexia, weight loss, chest pain, dyspnea on exertion, abdominal pain, and severe indigestion/heartburn.    Physical Exam  General:  Well-developed, in no acute distress; alert, appropriate and cooperative throughout examination overweight-appearing.   Eyes:  No corneal or conjunctival inflammation noted. EOMI. Perrla. Mouth:  Oral mucosa and oropharynx without lesions or exudates.  Teeth in good repair. Neck:  No deformities, masses, or tenderness noted. Lungs:  Normal respiratory effort, chest expands symmetrically. Lungs are clear to auscultation, no crackles or wheezes. Heart:  Normal rate and regular rhythm. S1 and S2 normal without gallop, murmur, click, rub or other extra sounds. Abdomen:  Bowel sounds positive,abdomen soft and non-tender without masses, organomegaly or hernias noted. Msk:  No deformity or scoliosis noted of thoracic or lumbar spine.  R deltoid is tender in 3 points. Subacr space bicipital tendon are NT Extremities:  No clubbing, cyanosis, edema, or deformity noted with normal full range of motion of all joints.   Neurologic:  alert & oriented X3 and gait normal.   Skin:  Intact without suspicious lesions or rashes Psych:  Cognition and judgment appear intact. Alert and cooperative with normal attention span and concentration. No apparent delusions, illusions, hallucinationsOriented X3, memory intact for recent and remote, good eye contact, not agitated, not suicidal, not homicidal, and flat affect.     Impression & Recommendations:  Problem # 1:  HYPOGONADISM (ICD-257.2) Assessment New Options to treat were discussed.  The labs were reviewed with the patient.  Risks vs benefits and controversies of a long term  testosterone use were discussed.  See "Patient Instructions". On the regimen of medicine(s) reflected in the chart    Problem # 2:  STRESS DISORDER (ICD-V62.89) Assessment: Unchanged  Problem # 3:  BIPOLAR DISORDER UNSPECIFIED (ICD-296.80) Assessment: Unchanged  Problem # 4:  HYPERTENSION (ICD-401.9) Assessment: Deteriorated  BP today: 146/90 Prior BP: 112/88 (05/20/2010)  Labs Reviewed: K+: 4.3 (01/18/2010) Creat: : 1.1 (01/18/2010)   Chol: 182 (01/18/2010)   HDL: 31.00 (01/18/2010)   LDL: 84 (12/11/2006)   TG: 258.0 (01/18/2010)  Problem # 5:  MICROSCOPIC HEMATURIA (ICD-599.72) chronic Assessment: Comment Only  Complete Medication List: 1)  Wellbutrin Sr 150 Mg Xr12h-tab (Bupropion hcl) .... 2 once daily 2)  Klonopin 2 Mg Tabs (Clonazepam) .Marland Kitchen.. 1 three times a day 3)  Vitamin D 1000 Unit Tabs (Cholecalciferol) .Marland Kitchen.. 1 by mouth qd 4)  Hydroxyzine Hcl 25 Mg Tabs (Hydroxyzine hcl) .Marland Kitchen.. 1-2 at hs as needed insomnia 5)  Pennsaid 1.5 % Soln (Diclofenac sodium) .... 3-5 gtt on skin three times a day for pain 6)  Vitamin B-12 500 Mcg Tabs (Cyanocobalamin) .Marland Kitchen.. 1 by mouth once daily 7)  Olanzapine 15 Mg Tbdp (Olanzapine) .Marland Kitchen.. 1 by mouth qhs 8)  Naproxen 500 Mg Tabs (Naproxen) .Marland Kitchen.. 1 by mouth two times a day pc for pain/arthritis 9)  Cyclobenzaprine Hcl 5 Mg Tabs (Cyclobenzaprine hcl) .Marland Kitchen.. 1 by mouth two times a day as needed spasm 10)  Androgel Pump 1.6 % Gel (testosterone)  .... Use 2 pumps on skin q am  Patient Instructions: 1)  Look up Androgel, Testim and Testosterone injections 2)  Please schedule a follow-up appointment in 3 months. 3)  BMP prior to visit, ICD-9: 4)  Hepatic Panel prior to visit, ICD-9:995.20 5)  Testosterone 257.20  995.20 6)  CBC w/ Diff prior to visit, ICD-9: 7)  PSA prior to visit, ICD-9: 8)  HbgA1C prior to visit, ICD-9:790.29 Prescriptions: ANDROGEL PUMP 1.6 % GEL (TESTOSTERONE) use 2 pumps on skin q am  #1 x 12   Entered and Authorized by:   [name] MD   Signed by:   [name] MD on 06/10/2010   Method used:   Print then Give to Patient   RxID:   [id number]    Orders Added: 1)  Est. Patient Level IV [id number]

## 2010-07-10 LAB — POCT I-STAT, CHEM 8
BUN: 21 mg/dL (ref 6–23)
Calcium, Ion: 1.14 mmol/L (ref 1.12–1.32)
Chloride: 112 mEq/L (ref 96–112)
Creatinine, Ser: 1.2 mg/dL (ref 0.4–1.5)
Glucose, Bld: 132 mg/dL — ABNORMAL HIGH (ref 70–99)
Hemoglobin: 12.9 g/dL — ABNORMAL LOW (ref 13.0–17.0)

## 2010-07-10 LAB — URINALYSIS, ROUTINE W REFLEX MICROSCOPIC
Bilirubin Urine: NEGATIVE
Glucose, UA: 100 mg/dL — AB
Ketones, ur: NEGATIVE mg/dL
Nitrite: NEGATIVE
Protein, ur: NEGATIVE mg/dL

## 2010-07-10 LAB — URINE MICROSCOPIC-ADD ON

## 2010-07-17 ENCOUNTER — Telehealth: Payer: Self-pay | Admitting: *Deleted

## 2010-07-17 NOTE — Telephone Encounter (Signed)
Patient requesting to know if we received info regarding pt assistance. Our office mailed out info but pt states pharm company told him all was not complete and they were sending info for MD to fill out.

## 2010-07-19 ENCOUNTER — Ambulatory Visit: Payer: Self-pay | Admitting: Internal Medicine

## 2010-07-19 NOTE — Telephone Encounter (Signed)
Pt states that Pt Assistance Paperwork from [employer] was faxed over for corrections and he's inquiry as to the status of completion and does he need to do anything.?

## 2010-08-01 ENCOUNTER — Telehealth: Payer: Self-pay | Admitting: Internal Medicine

## 2010-08-01 MED ORDER — TESTOSTERONE 20.25 MG/ACT (1.62%) TD GEL: 2.0000 "application " | TRANSDERMAL | Status: DC

## 2010-08-01 NOTE — Telephone Encounter (Signed)
[facility] Pt Assistance form returned for missing information: 1) Physician signature/page 1 2) Incomplete prescription

## 2010-09-03 ENCOUNTER — Other Ambulatory Visit: Payer: Self-pay | Admitting: Internal Medicine

## 2010-09-03 ENCOUNTER — Other Ambulatory Visit: Payer: BC Managed Care – PPO

## 2010-09-03 DIAGNOSIS — R7309 Other abnormal glucose: Secondary | ICD-10-CM

## 2010-09-03 DIAGNOSIS — E291 Testicular hypofunction: Secondary | ICD-10-CM

## 2010-09-03 DIAGNOSIS — T887XXA Unspecified adverse effect of drug or medicament, initial encounter: Secondary | ICD-10-CM

## 2010-09-03 NOTE — Discharge Summary (Signed)
[name], [name]                 ACCOUNT NO.:  [id number]   MEDICAL RECORD NO.:  [id number]          PATIENT TYPE:  INP   LOCATION:  3038                         FACILITY:  [facility]   PHYSICIAN:  Raylene Miyamoto, MD DATE OF BIRTH:  September 20, 1965   DATE OF ADMISSION:  04/25/2007  DATE OF DISCHARGE:  05/03/2007                               DISCHARGE SUMMARY   DISCHARGE DIAGNOSES:  1. Overdose of unknown agents, suspected to be benzodiazepines and      narcotics.  2. Altered mental status.  3. Acute respiratory failure requiring ventilatory support.  4. Protein calorie malnutrition.  5. Fever.   HISTORY OF PRESENT ILLNESS:  [name] is a 45 year old white male with  a history of bipolar disorder.  He was in the emergency room and was  intubated and required mechanical ventilatory support. Apparently, he  has been fired recently from [employer] where he used to work.  He has a  history of ongoing depression.  His sister checked on him and found him  incoherent, somnolent and progressively less responsive and required  orotracheal intubation in the field.  Pulmonary critical care was asked  to participate in his care and monitor him while he was in the intensive  care unit.   PAST MEDICAL HISTORY:  1. Anxiety/depression.  2. Bipolar disorder.  3. Migraine headaches.  4. Insomnia.  5. He does have a history of ectasia of the left vertebral artery      based on MRI in September of 2007 and is followed by Dr. Estanislado Pandy      for this.  An angiogram was suggested but not performed.   He has used Fioricet in the past for a migraine headache.   PRIMARY CARE PHYSICIAN:  Evie Lacks. Plotnikov, M.D. of [facility].  He does have a psychiatrist.  He noted he has not seen a doctor  in the past few months.   ALLERGIES:  None known.   LABORATORY DATA:  Hemoglobin 13.1, hematocrit 37.4, platelets 155, WBCs  7.7.  Sodium 142, potassium 4.3, chloride 109, CO2 of 26, BUN 16,  creatinine 0.95.  Glucose is 147.  AST is 13, ALT is 11, alkaline  phosphatase 73, total bilirubin 0.7, albumin of 3.2.  CK is 282,  triglycerides 84, chloride 143, calcium 9.1.  Urine culture showed no  growth.  Respiratory culture demonstrated normal oropharyngeal floral.  Alcohol level was less than 5.  A 2-D echocardiogram demonstrated  overall left ventricular function was vigorous.  EF of 65-70%.  Chest x-  ray on April 29, 2007 demonstrates minimal increase in bibasilar  atelectasis with atelectasis versus atelectatic pneumonia.   HOSPITAL COURSE BY DISCHARGE DIAGNOSES:  1. Overdose of unknown agents.  He was admitted to [facility]      and required mechanical ventilatory support for continued      ventilation.  He was initially tried to be extubated on April 27, 2007 and required re-intubation, and then was successfully      liberated from mechanical ventilatory support by April 29, 2007.      He no longer has pulmonary issues.  He has completed a course of      antimicrobial therapy for suspected aspiration initially with      Unasyn, and then patient treated with Augmentin, that being      discontinued on March 02, 2008.  2. Altered mental status which has resolved once his overdose had been      absorbed.  He does have severe depression and is having suicidal      ideation.  He was evaluated by Dr. Rhona Raider, and his medications      have been changed.  He is for admission to the [facility].  3. Acute respiratory failure which has resolved after successful      liberation of mechanical ventilatory support.  Fever with no      positive cultures.  He has completed antimicrobial therapy for      suspected aspiration.   DISCHARGE MEDICATIONS:  1. Depakote 500 mg p.o. b.i.d.  2. Protonix 40 mg daily.  3. Folic acid 1 mg daily.  4. Thiamine 100 mg daily.   DIET:  As tolerated.   DISPOSITION/CONDITION ON DISCHARGE:  He does have suicidal  ideation.  He  is being admitted to the [facility] for further evaluation  and treatment of his psychiatric depression and suicidal ideation.  Medically, he is cleared.      Jonathan Lambert, MSN, ACNP      Raylene Miyamoto, MD  Electronically Signed    SM/MEDQ  D:  05/03/2007  T:  05/03/2007  Job:  [id number]   cc:   Felizardo Hoffmann, M.D.

## 2010-09-03 NOTE — H&P (Signed)
[name], [name]                 ACCOUNT NO.:  [id number]   MEDICAL RECORD NO.:  [id number]          PATIENT TYPE:  INP   LOCATION:  [id number]                         FACILITY:  [facility]   PHYSICIAN:  Jonathan Noel, MD      DATE OF BIRTH:  [birth year]   DATE OF ADMISSION:  04/25/2007  DATE OF DISCHARGE:                              HISTORY & PHYSICAL   HISTORY OF PRESENT ILLNESS:  We were asked to admit [name] for a  suicidal overdose.  Apparently he is currently intubated and sedated and  history is obtained after reviewing his medical record and speaking to  his sister, he is a 45 year old gentleman with bipolar disorder.  No  history of prescription drug use.  His sister received a call around  3:15 p.m. where he stated his suicidal intention.  Apparently he was  fired from [employer] where he used to work.  He does have a history of  ongoing depression.  By the time his sister checked her messages it was  5 p.m.  She called EMS and on their arrival they found him less  responsive.  Apparently on arrival of the sister he was somewhat  incoherent and somnolent, became progressively less responsive and was  intubated in the field.   Chest x-ray after arrival to the ED shows ETT in good position.  EKG  shows sinus tachycardia with a QT corrected of 0.499.  He was  hypothermic on arrival to 95.4 with a heart rate of 90 and a blood  pressure of 128/72.  It was difficult to quantitate how many pills of  Diazepam; Klonopin he may have taken.  Urine toxicology was positive for  opiates,.benzodiazepines, and tetrahydrocannabinol.  Admission labs  showed a potassium of 2.6 and a calcium of 7.4.   PAST MEDICAL HISTORY:  1. Anxiety and depression.  2. Bipolar disorder.  3. Migraine headaches.  4. Insomnia.  5. He does have a history of ectasia of the left vertebral artery,      based on MRI in September 2007 and has seen Dr. Estanislado Pandy for this.      An angiogram was considered but not  performed.   He has used Fioricet in the past for migraine headaches.   PRIMARY CARE PHYSICIAN:  Apparently his primary physician is Dr.  [name] from [facility].  He does have a psychiatrist.  He  has not seen a doctor for the past few months.   PAST SURGICAL HISTORY:  None.   CURRENT MEDICATIONS:  Diazepam, Seroquel, Klonopin, other kinds of  medications for migraine headaches.   ALLERGIES:  None known.   SOCIAL HISTORY:  Does not smoke or drink.  He has used marijuana in the  past.  As per his brother-in-law he did not have the money to use street  drugs. However, his urine toxicology is positive for  tetrahydrocannabinol and opiates.  He used to work at [employer]   FAMILY HISTORY:  Noncontributory.   REVIEW OF SYSTEMS:  Unobtainable at the current time.  He has been more  depressed  in the past and clearly stated his suicidal intention to his  sister on the phone message.   PAST SURGICAL HISTORY:  Degenerative disk disease with diskectomy, L4-5  for herniated disk with autograft.   PHYSICAL EXAMINATION:  He appears his stated age, already intubated,  unresponsive, with graphic tattoos over his right upper arm.  Heart rate 98 per minute, sinus on monitor.  Blood pressure 128/72.  Oxygen saturation 100% on PRVC of 600, and a respiratory rate of 15.  HEENT:  Pupils 2 mm, reactive to light.  NECK:  Supple.  No JVD.  No lymphadenopathy.  CARDIOVASCULAR:  S1/S2 normal.  CHEST:  Clear to auscultation.  ABDOMEN:  Soft, nontender.  EXTREMITIES:  No edema.   LABORATORY DATA:  Sodium 141, potassium 2.6, chloride 109, bicarbonate  26, anion gap of 6, glucose 121, BUN and creatinine 10 /0.91.  Calcium  7.4.  Alcohol level was 52, salicylate level less than 4, urine  toxicology as described.   IMPRESSION:  1. Suicidal overdose of unknown quantity of Seroquel, Klonopin, and      Valium with alcohol and likely marijuana and opiates.  2. Hypokalemia, hypocalcemia.  3.  Prolonged QT interval.  4. Hypothermia.   RECOMMENDATIONS:  1. He is currently intubated for airway protection and blood gas was      7.35/43/118 on current settings.  We will continue the current      settings and repeat the blood gas in the morning.  Chest x-ray does      not show any infiltrates.  2. He obviously does not require any sedation currently.  If he does      wake up we will proceed with extubation.  If he gets agitated we      may use Versed and fentanyl for sedation.  3. QT interval will be monitored.  We will correct potassium.  P.o.      KCl 40 mEq was given via tube.  He will receive three runs of IV      KCl and 40 mg of KCl will be added to maintenance fluids, T5. NS at      75 ml an hour and then calcium gluconate will be administered.      Magnesium and phosphorus will be checked.  IV Protonix will be used      for GI prophylaxis and Lovenox for DVT prophylaxis.  4. I do note the ectasia of the left vertebral artery that has been      noted on MRI in September 2007.  At this point in the absence of      focal signs I do not feel that a head CT is necessary, although a      review will continue to assess his neurological status.  If he      remains unresponsive in 12 hours we may consider imaging.   Prognosis was discussed with his sister.  He will obviously need  psychiatric assessment once extubated.      Jonathan Noel, MD  Electronically Signed    RVA/MEDQ  D:  04/25/2007  T:  04/26/2007  Job:  [id number]

## 2010-09-03 NOTE — Consult Note (Signed)
NAMECAYMON, [name]                 ACCOUNT NO.:  [id number]   MEDICAL RECORD NO.:  [id number]          PATIENT TYPE:  INP   LOCATION:  3038                         FACILITY:  [facility]   PHYSICIAN:  [name], M.D.  DATE OF BIRTH:  November 08, 1965   DATE OF CONSULTATION:  04/30/2007  DATE OF DISCHARGE:                                 CONSULTATION   REQUESTING PHYSICIAN:  Jonathan Deem. Melvyn Novas, MD, FCCP   REASON FOR CONSULTATION:  Overdose with suicide attempts.   HISTORY OF PRESENT ILLNESS:  [name] is a 45 year old male who  was evicted from his place of residence.  He became acutely suicidal and  took an overdose of Klonopin with Seroquel.  He did require intubation.  He also has been using alcohol and marijuana.   PAST PSYCHIATRIC HISTORY:  The patient does have a history of manic  phases, as well as two previous suicide attempts.  He has been treated  with multiple anti-depressents in the past and has achieved some  response with Seroquel and Klonopin combined for his insomnia.   FAMILY PSYCHIATRIC HISTORY:  None known.   SOCIAL HISTORY:  [name] is single.  Religion:  [employer].  He uses  alcohol and marijuana.   PAST MEDICAL HISTORY:  Status post VDRF from overdose, now extubated.   MEDICATIONS:  No current psychotropics.   REVIEW OF SYSTEMS:  Noncontributory.   MENTAL STATUS EXAM:  Alert, oriented to all spheres.  Affect  constricted, mood depressed.  Thought process within normal limits by  content.  He acknowledges suicidal intent.  There are no hallucinations  or delusions.  Judgment is impaired.  Insight is poor.   ASSESSMENT:  Axis I:  Rule out 296.80, bipolar disorder not otherwise specified, depressed.  Rule out polysubstance dependence.  Axis II:  Deferred.  Axis III:  See general medical section.  Axis IV:  Economic primary support group.  Axis V:  30.   [name] has mood instability and is still at suicidal risk.   Recommend that he be admitted to  an inpatient psychiatric ward for  further evaluation and treatment, once medically cleared.      [name], M.D.  Electronically Signed     JW/MEDQ  D:  05/03/2007  T:  05/03/2007  Job:  [id number]

## 2010-09-03 NOTE — Consult Note (Signed)
[name], [name]                 ACCOUNT NO.:  [id number]   MEDICAL RECORD NO.:  [id number]          PATIENT TYPE:  INP   LOCATION:  3038                         FACILITY:  [facility]   PHYSICIAN:  Felizardo Hoffmann, M.D.  DATE OF BIRTH:  10/06/1965   DATE OF CONSULTATION:  05/03/2007  DATE OF DISCHARGE:                                 CONSULTATION   HISTORY OF PRESENT ILLNESS:  [name] now has decreased need for  sleep.  He slept three hours last night.  He has racing thoughts and  increased energy.  He is socially redirectable and cooperative.  He has  no longer any suicidal thoughts.  He is not having any hallucinations or  delusions.   In further review of the past psychiatric history, he does confirm that  he has had several day periods of increased energy, decreased need for  sleep and racing thoughts.  He has not been tried on Depakote.   LABORATORY DATA:  CBC was unremarkable.  LFTs were unremarkable.   MENTAL STATUS EXAM:  As above.  The patient is alert.  His judgment is  intact for the need of treatment.  He is cooperative, oriented to all  spheres.  Memory function is intact.   ASSESSMENT:  (296.80) bipolar disorder, not otherwise specified.   The indications, alternatives and adverse effects of Depakote for mood  stabilization were discussed with the patient including the risks of  potential lethal blood and liver effects.  He understands and would like  to start Depakote.   RECOMMENDATIONS:  1. Start Depakote 500 mg p.o. b.i.d.  Check a follow up CBC and liver      function panel.  2. Admit to a psychiatric hospital when available.  The patient does      contract for safety in the hospital.  Also, he is motivated for      care with intact judgment for the need of care.  Therefore, we will      discontinue the sitter.      Felizardo Hoffmann, M.D.  Electronically Signed     JW/MEDQ  D:  05/03/2007  T:  05/03/2007  Job:  [id number]

## 2010-09-03 NOTE — H&P (Signed)
NAMEZEF, BRUSSO                 ACCOUNT NO.:  [id number]   MEDICAL RECORD NO.:  [id number]          PATIENT TYPE:  IPS   LOCATION:  0508                          FACILITY:  [facility]   PHYSICIAN:  Carlton Adam, M.D.      DATE OF BIRTH:  1966-02-25   DATE OF ADMISSION:  05/04/2007  DATE OF DISCHARGE:                       PSYCHIATRIC ADMISSION ASSESSMENT   PATIENT IDENTIFICATION:  A 45 year old, single white male.  This is a  voluntary admission.   HISTORY OF PRESENT ILLNESS:  This patient was transferred from our  inpatient medical unit after he was treated for a polypharmacy overdose,  probably a significant amount of Klonopin and possibly some opiates and  possibly some Seroquel. He says that he had drunk a six-pack of beer  prior to overdosing and was throwing back handfuls of pills alternating  with alcohol.  He was found unresponsive by his family and suffered  acute respiratory failure and required ventilator support. He reports a  recent stressors with lack of income since being let go from his job at  [employer] While on the medical unit, he was evaluated by Dr. Felizardo Hoffmann, the consulting psychiatrist, who assessed him as having a  bipolar disorder.  He was started on Depakote 500 mg p.o. b.i.d. Today  he is fully alert and coherent, endorsing quite a lot of stress with his  lack of income, legal issues regarding his termination from [employer]  and was feeling very stressed about that.  He endorses some cannabis use  and some use of alcohol.  He denies regular use of alcohol or dependency  on alcohol.  He denies any active suicidal thoughts today.   PAST PSYCHIATRIC HISTORY:  The patient is receiving no current care.  This is his first [facility] admission.  He was previously diagnosed with  social anxiety in the 1990s and at that time was followed by Dr. Modena Slater here in [place]. Most recently followed for anxiety by his  primary care practitioner at [facility]  and was prescribed Seroquel and  Klonopin. He has been treated for anxiety in the past with Paxil which  he says caused him some emotional numbing and Zoloft which had increased  his heart rate.   SOCIAL HISTORY:  A single white male.  No children.  He has been sharing  home with four other people.  Now currently unemployed, stressed from  unemployment, having some financial crises.  Some advanced education and  has worked in [employer].  Also endorses  stress from struggling with lifestyle issues.   FAMILY HISTORY:  One uncle with a history of unspecified mental illness.  Alcohol and drug history.  He endorses a distant history of heavy  cocaine abuse.  Denies that he has used within the past year. He  actively uses cannabis currently several times a week.  Occasional  alcohol use,  pattern unclear.   MEDICAL HISTORY:  Currently followed by [facility]. Medical problems  are status post polypharmacy overdose with ventilator dependent  respiratory failure. Past medical history remarkable for spinal fusion  in  2005.   CURRENT MEDICATIONS STARTED ON MEDICAL UNIT:  1. Depakote ER 500 mg p.o. b.i.d.  2. Protonix 40 mg daily started on medicine.  3. Thiamine 100 mg daily.  4. Folic acid 1 mg daily.   PREVIOUS MEDICATIONS IN THE HOME SETTING:  1. Seroquel 100 mg p.o. q.h.s.  2. Klonopin 2 mg t.i.d.   DRUG ALLERGIES:  None.   POSITIVE PHYSICAL FINDINGS:  Please see the history and physical  performed on the internal medical unit. This is a tall, slim healthy-  appearing white male today who is in no distress, oriented x4 in full  contact with reality. Tall, slim build, 6 feet 3 inches tall, 211  pounds. Temperature 97.1, pulse 65, respirations 18, blood pressure  119/71.   DIAGNOSTIC STUDIES:  Diagnostic studies were done previously on the  medical unit.  CBC,  WBC 8.0, hemoglobin 14.4, hematocrit 41.7 and  platelets 265,000.  Chemistry, sodium 142,  potassium 4.3, chloride 109,  carbon dioxide 26, BUN 16, creatinine 0.95 and random glucose 147. Liver  enzymes were within normal limits and urine drug screen was positive for  benzodiazepines and opiates at the time of admission.  Alcohol level was  52.   MENTAL STATUS EXAM:  Fully alert male in full contact with reality,  oriented x4, pleasant, polite.  Affect is constricted but he is fully  appropriate.  Speech normal in pace, tone and production, articulate.  Mood is neutral, thought process logical, coherent, thinking linear  thoughts.  No dangerous thoughts today and no signs of internal  stimulation or confusion.  No signs of psychosis.  No paranoia.  No  guarding.  No flight of ideas. Cognition is intact. He is expressing his  primary concerns of having a lot of stress related to his legal  complications regarding his termination from his employer. Interested in  pursuing whenever he needed to do to clear that up.  He is fully alert,  no evidence of suicidality or homicidality.   AXIS I:  Depressive disorder NOS.  Cannabis abuse.  AXIS II:  No diagnosis.  AXIS III:  Status post polypharmacy overdose, ventilator dependent  respiratory failure.  AXIS IV:  Severe legal issues and problems with finances and  unemployment.  AXIS V:  Current 38 past year 70.   PLAN:  Voluntarily admit the patient with q. 15-minute checks in place.  We are going to continue his Depakote and other medications that were  started on the medical unit.  Meanwhile will hope to hear his family's  concerns and see what options we have for helping him with counseling  after he leaves here.  Estimated length of stay is 5 days.      Margaret A. Scott, N.P.      Carlton Adam, M.D.  Electronically Signed    MAS/MEDQ  D:  05/05/2007  T:  05/05/2007  Job:  [id number]
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
## 2010-09-06 NOTE — Op Note (Signed)
[facility]  Patient:    [name], [name]                          MRN: [id number] Proc. Date: 02/23/00 Adm. Date:  [id number] Disc. Date: [id number] Attending:  Kary Kos P                           Operative Report  PREOPERATIVE DIAGNOSIS:  Cauda equina syndrome for a large free fragment ruptured disk at L4-5.  PROCEDURE:  Decompressive lumbar laminectomy of L4 and L5 and microscopic diskectomy of the L4-5 inner space and microscopic dissection of the L5 nerve roots bilaterally.  SURGEON:  Julien Girt. Guy Begin., M.D.  FIRST ASSISTANT:  Dr. Granville Lewis.  ANESTHESIA:  General endotracheal.  IV FLUIDS:  1 liter.  URINE OUTPUT:  800 cc.  ESTIMATED BLOOD LOSS:  Less than 100.  HISTORY OF PRESENT ILLNESS:  The patient is very pleasant 45 year old gentleman who has had a month of progressive worsening back pain, all localized to the lower back.  He went and saw his chiropractor on Friday and underwent some therapy, and the patient went to the emergency room Friday night complaining of severe back and leg pain.  He was discharged from the emergency room.  On Sunday he began developing numbness over his entire legs, as well as into his groin.  This progressed to Saturday night and Sunday morning he presented to the emergency room complaining of 1 episode of incontinence and not being able to feel himself void.  The patient was noted to have full strength but a subtle anesthesia but good rectal tone. Preoperative MRI showed a very large free fragment disk ruptured from the L4-5 interspace and migrated caudally behind the L5 vertebral body.  The patient was emergently taken to the OR for a laminectomy.  DESCRIPTION OF PROCEDURE:  The patient was brought to the OR and was induced under general anesthesia and positioned prone on the Wilson frame.  His back was prepped and draped in the usual sterile fashion.  An incision was made and centered over the L5  spinous process and carried up over the L4 spinous process.  Intraoperative x-ray confirmed the L4-5 inner space and the L4 spinous process.  A subperiosteal dissection was carried up bilaterally on the L4 and L5 laminas, then the [place] with the AM8 drill bit was used to begin the laminotomy in the medial inferior aspect of the L4 lamina.  Then using a 2 and 3 mm Kerrison punch, and a 4 mm Kerrison punch the remainder of the laminotomy was continued bilaterally at L4 up to get underneath the ligament and then using gentle dissection, the ligament was divided and attention was then taken to the caudal extension.  Removal of the large free fragment of ligamentum flavum and this was carried down through the L5 lamina bilaterally.  Then attention was taken to the right side lateral gutter where again the Midas Rex drill and AM8 drill bit was used to drill this down and the the 2 mm and 3 mm Kerrison punches continued with the medial fasciectomy and the gutter exposing a very large humped up free fragment disk.  This was displaced in the thecal sac towards the patients left side and the thecal sac was noted to be under severe compression during the laminectomy.  The L5 nerve root on the patients right side was  displaced ventrally and laterally.  A large free fragment disk was teased up from the axillae of this nerve root and then the nerve root was clearly visualized and was reflected medially with a DErrico nerve root retractor and another large free fragment disk was teased out from underneath that nerve root and it was noted to be herniating through a rent in the ligament and annulus on the patients right side.  The annulus was opened more widely with the 11 blade scalpel.  The Omni operating scope was draped, was brought into the field and then with microscopic dissection of the epidural veins continued to coagulate and the remainder of the disk space was removed under the microscope.   Then the left side disk space was explored.  The annulus was noted to be intact.  There was a mild bulge in the medial aspect of the annulus exploring from the patients left side.  The L5 nerve root was identified and the remainder of the L5 foraminotomy on the patients left side was continued.  Then the DErrico nerve root retractor was placed reflecting the patients left L5 nerve root medially and the remainder of the disk space was palpated and noted to be no longer compressive after the completion of the diskectomy from the right.  It was elected not to enter the annulus on the patients left side.  An angled hockey stick was used to explore the L5 nerve root bilaterally, both left and right, and both nerve roots were noted to be completely decompressed.  The thecal sac was no longer stenosed as well.  The operating microscope was removed.  The wound was copiously irrigated and meticulous hemostasis was maintained.  Gelfoam was overlaid over the laminotomy and the wound was closed with 0 running Vicryls in the fascia, 2-0 running Vicryls in the subcutaneous tissue, and running 4-0 subcuticular in the skin.  At the end of the case all needle counts and sponge counts were correct.  The patient went to recovery room in stable condition. DD:  02/23/00 TD:  02/24/00 Job: [id number]

## 2010-09-06 NOTE — Assessment & Plan Note (Signed)
[facility]                             PRIMARY CARE OFFICE NOTE   NAME:[name], [name]                        MRN:          [id number]  DATE:12/18/2005                            DOB:          Aug 14, 1965    SEPARATE EVALUATION AND MANAGEMENT:   The patient presents today to follow up on abnormal results of his MRI.  He  is concerned about base of the skull and anterior headaches that have been  going on for the last 1 or 2 weeks.  The medications he received here prior  (Fioricet) did not help.  He has been nauseated with some migraines, chronic  sinus congestion, no colored drainage.   FAMILY HISTORY:  Negative for aneurysms.   PAST MEDICAL HISTORY:  1. Anxiety.  2. Depression.  3. Likely bipolar disorder.  4. Migraine headaches.  5. Insomnia.   CURRENT MEDICATIONS:  Seroquel, diazepam, Klonopin wafers.  Used to take  Nexium and Diovan, lately discontinued.   ALLERGIES:  No known drug allergies.   SOCIAL HISTORY:  Does not smoke or drink.  Denies using drugs.   REVIEW OF SYSTEMS:  Headache as above.  Nausea with migraines.  No  neurologic complaints.  Depressed and anxious as before, worried about the  MRI test results.  No blood in his stools.  The rest is negative.   PHYSICAL EXAMINATION:  VITAL SIGNS:  Blood pressure 131/90, pulse 93,  temperature 97.8, weight 226 pounds.  HEENT:  Moist mucosa.  NECK:  Supple, no thyromegaly or bruits.  LUNGS:  Clear to auscultation and percussion, no wheeze or rales.  CARDIAC:  S1, S2, no murmur, no gallop.  ABDOMEN:  Soft, nontender, no organomegaly is felt.  EXTREMITIES:  Lower extremities without edema.  Calves nontender.  NEUROLOGIC:  Alert, oriented and appropriate but depressed, not suicidal.  Neurologic exam reveals pupils reactive to light.  Eyes with full range of  motion, no double vision.  No visual field loss.  Cranial nerves II-XII  nonfocal.  Heel-to-toe straight line, slightly  unsteady (chronic).  Romberg  test is negative.  Deep tendon reflexes and muscle strength within normal  limits.   Labs on December 11, 2005, reveal glucose 133.  LDL 168.  TSH normal.  Urine  normal.  CBC normal.  EKG today is normal.  MRI of the brain on December 09, 2005, revealed no evidence of acute ischemia.  Single __________  intense  signal in the left midtemporal region.  Prominent left vertebrobasilar  junction measuring approximately 7.5 mm, causing mass effect on the  cervicomedullary junction with displacement suspicious for aneurysm.  Moderate sinusitis in ethmoid and maxillary sinuses and some in frontal  sinuses.  No blood present.   ASSESSMENT AND PLAN:  1. Abnormal MRI with possible vertebrobasilar aneurysm.  Would like to      obtain interventional radiology consultation with Dr. Estanislado Pandy, [place]      pending on Saturday.  I advised to avoid heat, no lifting over 25      pounds, call with problems.  Go to the ER if  problems.  2. Headache with a history of migraines.  Vicodin 5/500 mg q.i.d. p.r.n.,      Phenergan 25 mg p.r.n.  3. Sinusitis, likely chronic.  Prescribed Ceftin 500 mg p.o. b.i.d. for 3      weeks due to the involvement in the deep sinuses.  4. Elevated glucose.  He lost a lot of weight.  Will monitor with A1c.  5. Anxiety/depression/insomnia.  Seroquel 100 mg p.o. t.i.d. refilled,      Klonopin 2 mg t.i.d. wafers refilled, diazepam 10 mg q.h.s. refilled      (do not take diazepam with Klonopin).                                   [name], MD   AP/MedQ  DD:  12/18/2005  DT:  12/18/2005  Job #:  [id number]   cc:   Sanjeev K. Estanislado Pandy, MD

## 2010-09-06 NOTE — Consult Note (Signed)
[name], [name]                 ACCOUNT NO.:  [id number]   MEDICAL RECORD NO.:  [id number]          PATIENT TYPE:  OUT   LOCATION:  XRAY                         FACILITY:  [facility]   PHYSICIAN:  Sanjeev K. Deveshwar, M.D.DATE OF BIRTH:  10/17/1965   DATE OF CONSULTATION:  DATE OF DISCHARGE:                                   CONSULTATION   DATE OF CONSULT:  December 26, 2005.   CHIEF COMPLAINT:  Abnormal MRI/MRA.   HISTORY OF PRESENT ILLNESS:  This is a 45 year old male with a history of  headaches that have been going on for approximately 1 month.  The patient  describes migraine-like headaches associated with blurred vision and  presyncope.  He had an MRI recently that showed a possible vertebral basilar  artery aneurysm and MRA was performed on December 20, 2005 that showed  marked ectasia of the left vertebral artery and the basilar artery.  There  was no definite aneurysm noted, however, there were some nonspecific  abnormalities.  The patient has been referred to Dr. Estanislado Pandy for further  discussions. He presents today accompanied by his sister.   PAST MEDICAL HISTORY:  Significant for borderline diabetes, dyslipidemia, a  history of anxiety and depression, question bipolar disorder, a history of  migraine headaches, insomnia, sinusitis recently treated with Ceftin, a  history of back problems.   SURGICAL HISTORY:  The patient has had back surgery apparently several  times.   ALLERGIES:  No known drug allergies.   CURRENT MEDICATIONS:  Seroquel, diazepam, Klonopin, Fioricet, Vicodin and  Phenergan.   SOCIAL HISTORY:  The patient is single.  He has one child.  The patient  lives in [place] with a roommate.  He smokes a pack cigarettes per day  and has done so for at least 25 years.  He denies significant alcohol use.  He works as a Banker.   FAMILY HISTORY:  His mother died in her 24s from cancer, his father died in  his [id number] from complications of  diabetes.   IMPRESSION AND PLAN:  As noted, the patient presents today accompanied by  his sister to meet with Dr. Estanislado Pandy to discuss the results of his recent  MRA/MRI. Dr. Estanislado Pandy reviewed the images with the patient and his sister.  He pointed out the areas of concern. Once again, an aneurysm could not be  definitely ruled in or ruled out based on these studies.  Dr. Estanislado Pandy  discussed limitations of the MRI and the MRA. He recommended a cerebral  angiogram if the patient wanted further evaluation.  He briefly discussed  aneurysms and the potential for rupture.  He answered all of the patient's and his sister's questions.  He asked the  patient to go home and think about whether or not he want to proceed with a  cerebral angiogram to further evaluate this area.  The patient will contact  us next week with his decision. Greater than 30 minutes was spent on this  consult.      Mikey Bussing, P.A.    ______________________________  Fritz Pickerel. Estanislado Pandy, M.D.  DR/MEDQ  D:  12/26/2005  T:  12/27/2005  Job:  [id number]   cc:   Evie Lacks. Plotnikov, MD

## 2010-09-06 NOTE — Assessment & Plan Note (Signed)
[facility]                                 ON-CALL NOTE   NAME:Jonathan  Gray, [name]                      MRN:          [id number]  DATE:07/10/2006                            DOB:          04-Feb-1966    TIME RECEIVED:  4:40 p.m.  The patient is [name].  The caller is  the same.  He used to see Dr. [name].  Date of birth 1965/11/20.  Telephone [phone].  The patient has a long history of migraine  headaches.  He says he has had a migraine headache consistently since  Wednesday of this week.  He has been using Phenergan at home to control  the nausea, but nothing over the counter is helping is headache.  Otherwise no fever, no other symptoms.  He says this has acted exactly  like his migraines usually present.  He has used tramadol in the past  successfully and asks that I call in a small supply in for him to use  until he can find a new local doctor.  Apparently he recently moved to  [place], [place].  Initially, my response was to call in tramadol 50  mg to use as needed, #30 with no refills to the [facility] in  [place], [place] at [phone].  However, at 4:58 p.m. that  afternoon I got a page back from [place] who is a Software engineer at this  pharmacy.  She wanted me to be aware that the patient had already picked  up a supply of Vicodin earlier today at that pharmacy that had been  prescribed by another doctor.  When I learned this, I told her to cancel  the tramadol prescription that I had called in and to inform the patient  not to contact us anymore, that he would need to use local doctors from  now on.     Ishmael Holter. Sarajane Jews, MD  Electronically Signed    SAF/MedQ  DD: 07/11/2006  DT: 07/11/2006  Job #: [id number]

## 2010-09-06 NOTE — Discharge Summary (Signed)
   NAME:  [name], [name]                           ACCOUNT NO.:  [id number]   MEDICAL RECORD NO.:  [id number]                   PATIENT TYPE:  INP   LOCATION:  3028                                 FACILITY:  [facility]   PHYSICIAN:  Marchia Meiers. Vertell Limber, M.D.               DATE OF BIRTH:  1965-04-27   DATE OF ADMISSION:  05/27/2002  DATE OF DISCHARGE:  05/30/2002                                 DISCHARGE SUMMARY   REASON FOR ADMISSION:  Lumbar disc herniation and lumbar spondylosis,  hypertension and cauda equina syndrome.   FINAL DIAGNOSES:  1. Lumbar disc herniation and lumbar spondylosis.  2. Hypertension.  3. Cauda equina syndrome.   HISTORY OF ILLNESS AND HOSPITAL COURSE:  The patient is a 45 year old man  who has previously undergone lumbar diskectomy at L4-5 for spondylosis,  degenerative disc disease and radiculopathy, lumbar disc herniation and  cauda equina syndrome.  He now presents with spondylosis, degenerative disc  disease and radiculopathy with recurrent disc herniation.  It was elected to  take him to surgery for redo laminectomy with transverse lumbar interbody  fusion.  This was done on May 27, 2002 with uneventful surgical  procedure.  He was doing well and gradually over that week-end and then on  May 30, 2002 was doing well with ambulation, wearing brace, taking  Percocet and Valium for pain.  He did have a postoperative fever which  resolved.  He was discharged home with follow up instructions to return to  see me in my office in three to four weeks with lumbar x-rays.                                               Marchia Meiers. Vertell Limber, M.D.    JDS/MEDQ  D:  06/24/2002  T:  06/25/2002  Job:  [id number]

## 2010-09-06 NOTE — Op Note (Signed)
NAME:  [name], [name]                           ACCOUNT NO.:  [id number]   MEDICAL RECORD NO.:  [id number]                   PATIENT TYPE:  INP   LOCATION:  2899                                 FACILITY:  [facility]   PHYSICIAN:  Marchia Meiers. Vertell Limber, M.D.               DATE OF BIRTH:  1965-08-06   DATE OF PROCEDURE:  DATE OF DISCHARGE:                                 OPERATIVE REPORT   PREOPERATIVE DIAGNOSES:  1. Recurrent herniated lumbar disk, L4-5 with spondylosis.  2. Degenerative disk disease.  3. Lumbar radiculopathy.   POSTOPERATIVE DIAGNOSES:  1. Recurrent herniated lumbar disk, L4-5 with spondylosis.  2. Degenerative disk disease.  3. Lumbar radiculopathy.   PROCEDURE:  Re-do diskectomy, L4-5 with transverse lumbar interbody fusion  (9 mm Synthes bone graft with morselized autograft) with pedicle screw  fixation of L4 through L5 bilaterally with posterolateral arthrodesis using  local bone autograft and VTAS.   SURGEON:  Dr. Vertell Limber.   ASSISTANT:  Dr. Arnoldo Morale.   ANESTHESIA:  General endotracheal anesthesia.   ESTIMATED BLOOD LOSS:  200 cc.   COMPLICATIONS:  None.   DISPOSITION:  Recovery Room.   INDICATIONS FOR PROCEDURE:  [name] is a 45 year old man who had  previously had a large disk herniation at L4-5 causing a cauda equina  syndrome.  He had a total laminectomy done by another surgeon and had relief  of his cauda equina syndrome; however, recently, he has developed recurrent  right lower extremity pain and was found preoperative imaging to have a  large, recurrent disk herniation at the L4-5 level compressing the right  greater than left L5 nerve roots.  It was elected to take him to surgery for  re-do diskectomy and decompression along the fusion.   DESCRIPTION OF PROCEDURE:  [name] is brought to the operating room.  Following the identification and uncomplicated induction of general  endotracheal anesthesia and placement of intravenous lines, the  patient was  placed in the prone position on the operating table.  His low back was then  shaved, prepped and draped in the usual sterile fashion.  Planned incision  was infiltrated with 0.25% Marcaine, 0.5% Lidocaine, 1:200,000 epinephrine.  Incision was made in the midline, his old scar was revised and incision was  carried through copious tissue to the lumbodorsal fascia which was incised  bilaterally.  Subperiosteal dissection was performed from the L3 to L5  spinous processes and laminae, exposing the L4 and L5 transverse processes.  The self-retraining Versatrax retractor was placed to facilitate exposure.  A total laminectomy of L4 was then performed, this was done using loop  magnification.  The L4 and L5 nerve roots were then decompressed  bilaterally.  Diskectomy was then performed on the right, initially at the  L4-5 level.  Large amount of recurrent disk material was removed.  The end  plates were strictly residual disk material.  Diskectomy  was then performed  on the left where similar large disk herniation was identified and removed,  resulting in significant decompression of the thecal sac and nerve roots.  The Synthes interspace spreader was then placed on the left and more work  was done on the right to clear the disk space of residual disk material  using a variety of curets and box cutting curets.  Subsequently, a 9 mm  trial spacer was placed, position was confirmed under fluoroscopic  visualization and a 9 mm interbody graft was placed.  Morselized bone graft  was placed overlying it and tamped into position.  Subsequently, 6.75 x 40  mm ST90 pedicle screws were placed, two at L4, two a L5, all screws had  excellent purchase.  Positioning was confirmed under AP and lateral  fluoroscopy.  The right superior screw appeared to have lateral cut out on  the AP film, but it appeared to be quite secure and the other screws  appeared to be well positioned so it was elected to  leave this in position.  Morselized bone autograft along with VTAS was placed along the transverse  processes of L4 and L5 bilaterally, and 40 mm rods were then placed over the  screws and locked into position in situ.  The wound was closed with 1 Vicryl  sutures and reapproximated the lumbodorsal fascia and 2-0 and 3-0 Vicryl  stitches reapproximated with skin edges.  The wound was dressed with  Benzoin, Steri-Strips, Telfa gauze and tape.  The patient was extubated in  the operating room, take to the recovery room in stable, satisfactory  condition, having tolerated his operation well.  Counts were correct at the  end of the case.                                               Marchia Meiers. Vertell Limber, M.D.    JDS/MEDQ  D:  05/27/2002  T:  05/28/2002  Job:  [id number]

## 2010-09-10 ENCOUNTER — Ambulatory Visit: Payer: BC Managed Care – PPO | Admitting: Internal Medicine

## 2010-09-19 ENCOUNTER — Other Ambulatory Visit: Payer: Self-pay | Admitting: Internal Medicine

## 2010-09-19 NOTE — Telephone Encounter (Signed)
Also, rec rf req for Clonazepam 2mg  1 po tid. # 90. Last filled 08-23-10. Ok to Rf?

## 2010-09-23 ENCOUNTER — Telehealth: Payer: Self-pay | Admitting: *Deleted

## 2010-09-23 NOTE — Telephone Encounter (Signed)
OK to fill this prescription with additional refills x5 Thank you!  

## 2010-09-23 NOTE — Telephone Encounter (Signed)
rec Rf req for Clonazepam 2mg  1 po tid. #90. Last filled 08-23-10. Ok to Rf?

## 2010-09-24 MED ORDER — CLONAZEPAM 2 MG PO TABS: 2.0000 mg | ORAL_TABLET | Freq: Three times a day (TID) | ORAL | Status: DC | PRN

## 2010-09-25 ENCOUNTER — Telehealth: Payer: Self-pay | Admitting: *Deleted

## 2010-09-25 NOTE — Telephone Encounter (Signed)
Pt requesting refill for: cyclobenzaprine Hcl 5mg  tabs [last refill 05/20/2010 #60x3 Sig: take 1 tab bid prn for spasms] Please advise.

## 2010-09-26 ENCOUNTER — Telehealth: Payer: Self-pay | Admitting: *Deleted

## 2010-09-26 NOTE — Telephone Encounter (Signed)
OK to fill this prescription with additional refills x1 Thank you!  

## 2010-09-26 NOTE — Telephone Encounter (Signed)
Pt requesting refill for: cyclobenzaprine Hcl 5mg  tabs [last refill 05/20/2010 #60x3 Sig: take 1 tab bid prn for spasms

## 2010-09-27 MED ORDER — CYCLOBENZAPRINE HCL 5 MG PO TABS: 5.0000 mg | ORAL_TABLET | Freq: Two times a day (BID) | ORAL | Status: DC | PRN

## 2010-09-27 NOTE — Telephone Encounter (Signed)
Pt advised of refill/pharmacy

## 2010-10-29 ENCOUNTER — Other Ambulatory Visit: Payer: BC Managed Care – PPO

## 2010-10-29 ENCOUNTER — Other Ambulatory Visit: Payer: Self-pay | Admitting: Internal Medicine

## 2010-10-30 ENCOUNTER — Encounter: Payer: Self-pay | Admitting: Internal Medicine

## 2010-10-30 ENCOUNTER — Other Ambulatory Visit (INDEPENDENT_AMBULATORY_CARE_PROVIDER_SITE_OTHER): Payer: BC Managed Care – PPO

## 2010-10-30 DIAGNOSIS — T887XXA Unspecified adverse effect of drug or medicament, initial encounter: Secondary | ICD-10-CM

## 2010-10-30 DIAGNOSIS — E291 Testicular hypofunction: Secondary | ICD-10-CM

## 2010-10-30 DIAGNOSIS — R7309 Other abnormal glucose: Secondary | ICD-10-CM

## 2010-10-30 LAB — CBC WITH DIFFERENTIAL/PLATELET
Basophils Absolute: 0 10*3/uL (ref 0.0–0.1)
Basophils Relative: 0.5 % (ref 0.0–3.0)
Eosinophils Relative: 2.2 % (ref 0.0–5.0)
HCT: 46.6 % (ref 39.0–52.0)
Hemoglobin: 16.3 g/dL (ref 13.0–17.0)
MCHC: 34.9 g/dL (ref 30.0–36.0)
Monocytes Absolute: 0.6 10*3/uL (ref 0.1–1.0)
Neutro Abs: 4.7 10*3/uL (ref 1.4–7.7)
RDW: 13.8 % (ref 11.5–14.6)

## 2010-10-30 LAB — HEPATIC FUNCTION PANEL
ALT: 35 U/L (ref 0–53)
AST: 28 U/L (ref 0–37)
Albumin: 4.8 g/dL (ref 3.5–5.2)
Total Bilirubin: 0.4 mg/dL (ref 0.3–1.2)

## 2010-10-30 LAB — BASIC METABOLIC PANEL
CO2: 29 mEq/L (ref 19–32)
Chloride: 109 mEq/L (ref 96–112)
Creatinine, Ser: 1.1 mg/dL (ref 0.4–1.5)

## 2010-10-30 LAB — HEMOGLOBIN A1C: Hgb A1c MFr Bld: 7.8 % — ABNORMAL HIGH (ref 4.6–6.5)

## 2010-11-06 ENCOUNTER — Ambulatory Visit (INDEPENDENT_AMBULATORY_CARE_PROVIDER_SITE_OTHER): Payer: Self-pay | Admitting: Internal Medicine

## 2010-11-06 ENCOUNTER — Encounter: Payer: Self-pay | Admitting: Internal Medicine

## 2010-11-06 DIAGNOSIS — I10 Essential (primary) hypertension: Secondary | ICD-10-CM

## 2010-11-06 DIAGNOSIS — F319 Bipolar disorder, unspecified: Secondary | ICD-10-CM

## 2010-11-06 DIAGNOSIS — E119 Type 2 diabetes mellitus without complications: Secondary | ICD-10-CM

## 2010-11-06 DIAGNOSIS — E1165 Type 2 diabetes mellitus with hyperglycemia: Secondary | ICD-10-CM | POA: Insufficient documentation

## 2010-11-06 DIAGNOSIS — R453 Demoralization and apathy: Secondary | ICD-10-CM

## 2010-11-06 MED ORDER — METFORMIN HCL 500 MG PO TABS: 500.0000 mg | ORAL_TABLET | Freq: Two times a day (BID) | ORAL | Status: DC

## 2010-11-06 MED ORDER — TESTOSTERONE 20.25 MG/ACT (1.62%) TD GEL: 4.0000 | TRANSDERMAL | Status: DC

## 2010-11-06 MED ORDER — CYCLOBENZAPRINE HCL 10 MG PO TABS: 10.0000 mg | ORAL_TABLET | Freq: Two times a day (BID) | ORAL | Status: DC | PRN

## 2010-11-06 MED ORDER — IBUPROFEN 600 MG PO TABS: ORAL_TABLET | ORAL | Status: DC

## 2010-11-06 NOTE — Progress Notes (Signed)
  Subjective:    Patient ID: [name], male    DOB: 1966-02-16, 45 y.o.   MRN: [id number]  HPI  The patient presents for a follow-up of  chronic hypertension, chronic dyslipidemia, type 2 diabetes controlled with medicines  Wt Readings from Last 3 Encounters:  11/06/10 234 lb (106.142 kg)  06/10/10 256 lb (116.121 kg)  05/20/10 256 lb (116.121 kg)     Review of Systems  Constitutional: Negative for appetite change, fatigue and unexpected weight change.  HENT: Negative for nosebleeds, congestion, sore throat, sneezing, trouble swallowing and neck pain.   Eyes: Negative for itching and visual disturbance.  Respiratory: Negative for cough.   Cardiovascular: Negative for chest pain, palpitations and leg swelling.  Gastrointestinal: Negative for nausea, diarrhea, blood in stool and abdominal distention.  Genitourinary: Negative for frequency and hematuria.  Musculoskeletal: Negative for back pain, joint swelling and gait problem.  Skin: Negative for rash.  Neurological: Negative for dizziness, tremors, speech difficulty and weakness.  Psychiatric/Behavioral: Negative for sleep disturbance, dysphoric mood and agitation. The patient is not nervous/anxious.        Objective:   Physical Exam  Constitutional: He is oriented to person, place, and time. He appears well-developed.  HENT:  Mouth/Throat: Oropharynx is clear and moist.  Eyes: Conjunctivae are normal. Pupils are equal, round, and reactive to light.  Neck: Normal range of motion. No JVD present. No thyromegaly present.  Cardiovascular: Normal rate, regular rhythm, normal heart sounds and intact distal pulses.  Exam reveals no gallop and no friction rub.   No murmur heard. Pulmonary/Chest: Effort normal and breath sounds normal. No respiratory distress. He has no wheezes. He has no rales. He exhibits no tenderness.  Abdominal: Soft. Bowel sounds are normal. He exhibits no distension and no mass. There is no tenderness. There  is no rebound and no guarding.  Musculoskeletal: Normal range of motion. He exhibits no edema and no tenderness.  Lymphadenopathy:    He has no cervical adenopathy.  Neurological: He is alert and oriented to person, place, and time. He has normal reflexes. No cranial nerve deficit. He exhibits normal muscle tone. Coordination normal.  Skin: Skin is warm and dry. No rash noted.  Psychiatric: He has a normal mood and affect. His behavior is normal. Judgment and thought content normal.         Lab Results  Component Value Date   WBC 8.4 10/30/2010   HGB 16.3 10/30/2010   HCT 46.6 10/30/2010   PLT 156.0 10/30/2010   CHOL 182 01/18/2010   TRIG 258.0* 01/18/2010   HDL 31.00* 01/18/2010   LDLDIRECT 117.3 01/18/2010   ALT 35 10/30/2010   AST 28 10/30/2010   NA 142 10/30/2010   K 4.1 10/30/2010   CL 109 10/30/2010   CREATININE 1.1 10/30/2010   BUN 18 10/30/2010   CO2 29 10/30/2010   TSH 1.16 01/18/2010   PSA 1.41 10/30/2010   HGBA1C 7.8* 10/30/2010    Assessment & Plan:

## 2010-11-06 NOTE — Patient Instructions (Signed)
Wt Readings from Last 3 Encounters:  11/06/10 234 lb (106.142 kg)  06/10/10 256 lb (116.121 kg)  05/20/10 256 lb (116.121 kg)

## 2010-11-06 NOTE — Assessment & Plan Note (Signed)
Start Metformin

## 2010-11-06 NOTE — Assessment & Plan Note (Signed)
Resolved with job change

## 2010-11-06 NOTE — Assessment & Plan Note (Signed)
Diet controlled.  

## 2010-11-06 NOTE — Assessment & Plan Note (Signed)
Doing better.   

## 2011-01-08 LAB — POCT I-STAT 3, ART BLOOD GAS (G3+)
Acid-base deficit: 1
Acid-base deficit: 2
Bicarbonate: 24
O2 Saturation: 98
O2 Saturation: 99
Operator id: 244851
Operator id: 244851
Patient temperature: 38.2
Patient temperature: 38.2
Patient temperature: 98.6
TCO2: 22
pCO2 arterial: 28.7 — ABNORMAL LOW
pCO2 arterial: 39.5
pCO2 arterial: 45
pH, Arterial: 7.333 — ABNORMAL LOW
pH, Arterial: 7.341 — ABNORMAL LOW
pO2, Arterial: 80

## 2011-01-08 LAB — RAPID URINE DRUG SCREEN, HOSP PERFORMED
Barbiturates: NOT DETECTED
Cocaine: NOT DETECTED
Opiates: POSITIVE — AB

## 2011-01-08 LAB — BASIC METABOLIC PANEL
BUN: 10
Calcium: 8.1 — ABNORMAL LOW
Calcium: 8.6
Chloride: 109
Creatinine, Ser: 1.1
GFR calc Af Amer: >60
GFR calc non Af Amer: >60
GFR calc non Af Amer: >60
GFR calc non Af Amer: >60
Glucose, Bld: 126 — ABNORMAL HIGH
Glucose, Bld: 138 — ABNORMAL HIGH
Glucose, Bld: 147 — ABNORMAL HIGH
Potassium: 4.3
Sodium: 140
Sodium: 142

## 2011-01-08 LAB — CULTURE, BLOOD (ROUTINE X 2): Culture: NO GROWTH

## 2011-01-08 LAB — COMPREHENSIVE METABOLIC PANEL
AST: 13
Alkaline Phosphatase: 54
BUN: 10
CO2: 28
Calcium: 9.2
Chloride: 109
Creatinine, Ser: 1.21
GFR calc Af Amer: >60
GFR calc non Af Amer: >60
GFR calc non Af Amer: >60
Glucose, Bld: 121 — ABNORMAL HIGH
Potassium: 2.6 — CL
Total Bilirubin: 0.6
Total Protein: 4.5 — ABNORMAL LOW
Total Protein: 6.6

## 2011-01-08 LAB — CBC
HCT: 36.6 — ABNORMAL LOW
HCT: 37.4 — ABNORMAL LOW
Hemoglobin: 12.7 — ABNORMAL LOW
Hemoglobin: 13.1
Hemoglobin: 14.4
Hemoglobin: 14.5
MCHC: 34.7
MCV: 88.1
Platelets: 146 — ABNORMAL LOW
Platelets: 152
RBC: 4.55
RBC: 4.73
RDW: 13.6
RDW: 13.9
RDW: 14.3
WBC: 11.5 — ABNORMAL HIGH
WBC: 7.9

## 2011-01-08 LAB — CULTURE, RESPIRATORY W GRAM STAIN

## 2011-01-08 LAB — ETHANOL: Alcohol, Ethyl (B): <5

## 2011-01-08 LAB — URINE CULTURE
Colony Count: NO GROWTH
Culture: NO GROWTH

## 2011-01-08 LAB — URINALYSIS, ROUTINE W REFLEX MICROSCOPIC
Bilirubin Urine: NEGATIVE
Ketones, ur: NEGATIVE
Leukocytes, UA: NEGATIVE
Nitrite: NEGATIVE
Protein, ur: 30 — AB
pH: 5.5

## 2011-01-08 LAB — CHOLESTEROL, TOTAL: Cholesterol: 143

## 2011-01-08 LAB — PROTIME-INR
INR: 1.1
Prothrombin Time: 14

## 2011-01-08 LAB — HEPATIC FUNCTION PANEL
Alkaline Phosphatase: 68
Bilirubin, Direct: <0.1
Total Bilirubin: 0.3

## 2011-01-08 LAB — URINE MICROSCOPIC-ADD ON

## 2011-01-08 LAB — APTT: aPTT: 32

## 2011-01-08 LAB — SALICYLATE LEVEL: Salicylate Lvl: <4

## 2011-01-08 LAB — CK: Total CK: 282 — ABNORMAL HIGH

## 2011-01-08 LAB — ACETAMINOPHEN LEVEL: Acetaminophen (Tylenol), Serum: <10 — ABNORMAL LOW

## 2011-01-08 LAB — MAGNESIUM: Magnesium: 1.7

## 2011-02-05 LAB — DIFFERENTIAL
Basophils Absolute: 0
Basophils Relative: 0
Eosinophils Absolute: 0.2
Eosinophils Relative: 2
Lymphocytes Relative: 24
Lymphs Abs: 2
Monocytes Absolute: 0.6
Monocytes Relative: 8
Neutro Abs: 5.5
Neutrophils Relative %: 66

## 2011-02-05 LAB — I-STAT 8, (EC8 V) (CONVERTED LAB)
Bicarbonate: 26.4 — ABNORMAL HIGH
Glucose, Bld: 119 — ABNORMAL HIGH
TCO2: 28
pH, Ven: 7.402 — ABNORMAL HIGH

## 2011-02-05 LAB — CBC
HCT: 45.9
Hemoglobin: 15.8
MCHC: 34.5
MCV: 89.1
Platelets: 215
RBC: 5.15
RDW: 12.5
WBC: 8.3

## 2011-02-05 LAB — URINE CULTURE: Colony Count: 2000

## 2011-02-05 LAB — POCT I-STAT CREATININE: Operator id: 288831

## 2011-02-11 ENCOUNTER — Other Ambulatory Visit (INDEPENDENT_AMBULATORY_CARE_PROVIDER_SITE_OTHER): Payer: Self-pay

## 2011-02-11 DIAGNOSIS — F319 Bipolar disorder, unspecified: Secondary | ICD-10-CM

## 2011-02-11 DIAGNOSIS — E119 Type 2 diabetes mellitus without complications: Secondary | ICD-10-CM

## 2011-02-11 DIAGNOSIS — I10 Essential (primary) hypertension: Secondary | ICD-10-CM

## 2011-02-11 DIAGNOSIS — R453 Demoralization and apathy: Secondary | ICD-10-CM

## 2011-02-11 LAB — HEMOGLOBIN A1C: Hgb A1c MFr Bld: 5.7 % (ref 4.6–6.5)

## 2011-02-11 LAB — TESTOSTERONE: Testosterone: 1040.39 ng/dL — ABNORMAL HIGH (ref 350.00–890.00)

## 2011-02-11 LAB — COMPREHENSIVE METABOLIC PANEL
ALT: 33 U/L (ref 0–53)
AST: 30 U/L (ref 0–37)
Alkaline Phosphatase: 88 U/L (ref 39–117)
CO2: 27 mEq/L (ref 19–32)
Sodium: 144 mEq/L (ref 135–145)
Total Bilirubin: 0.7 mg/dL (ref 0.3–1.2)
Total Protein: 6.7 g/dL (ref 6.0–8.3)

## 2011-02-17 ENCOUNTER — Ambulatory Visit (INDEPENDENT_AMBULATORY_CARE_PROVIDER_SITE_OTHER): Payer: Self-pay | Admitting: Internal Medicine

## 2011-02-17 ENCOUNTER — Encounter: Payer: Self-pay | Admitting: Internal Medicine

## 2011-02-17 VITALS — BP 130/92 | HR 85 | Temp 97.0°F | Wt 214.0 lb

## 2011-02-17 DIAGNOSIS — I10 Essential (primary) hypertension: Secondary | ICD-10-CM

## 2011-02-17 DIAGNOSIS — R453 Demoralization and apathy: Secondary | ICD-10-CM

## 2011-02-17 DIAGNOSIS — K5904 Chronic idiopathic constipation: Secondary | ICD-10-CM

## 2011-02-17 DIAGNOSIS — K5909 Other constipation: Secondary | ICD-10-CM

## 2011-02-17 DIAGNOSIS — E119 Type 2 diabetes mellitus without complications: Secondary | ICD-10-CM

## 2011-02-17 DIAGNOSIS — F319 Bipolar disorder, unspecified: Secondary | ICD-10-CM

## 2011-02-17 DIAGNOSIS — Z23 Encounter for immunization: Secondary | ICD-10-CM

## 2011-02-17 DIAGNOSIS — N529 Male erectile dysfunction, unspecified: Secondary | ICD-10-CM

## 2011-02-17 DIAGNOSIS — F17200 Nicotine dependence, unspecified, uncomplicated: Secondary | ICD-10-CM

## 2011-02-17 DIAGNOSIS — E291 Testicular hypofunction: Secondary | ICD-10-CM

## 2011-02-17 MED ORDER — TADALAFIL 5 MG PO TABS: 5.0000 mg | ORAL_TABLET | Freq: Every day | ORAL | Status: DC

## 2011-02-17 MED ORDER — LUBIPROSTONE 24 MCG PO CAPS: 24.0000 ug | ORAL_CAPSULE | Freq: Two times a day (BID) | ORAL | Status: DC

## 2011-02-17 NOTE — Assessment & Plan Note (Signed)
Continue with current prescription therapy as reflected on the Med list.  

## 2011-02-17 NOTE — Progress Notes (Signed)
  Subjective:    Patient ID: [name], male    DOB: Jun 27, 1965, 45 y.o.   MRN: [id number]  HPI  The patient presents for a follow-up of  Hypogonadism, chronic hypertension, chronic dyslipidemia, type 2 diabetes controlled with medicines. C/o ED C/o R groin and R thigh pain  Wt Readings from Last 3 Encounters:  02/17/11 214 lb (97.07 kg)  11/06/10 234 lb (106.142 kg)  06/10/10 256 lb (116.121 kg)     Review of Systems  Constitutional: Negative for appetite change, fatigue and unexpected weight change.  HENT: Negative for nosebleeds, congestion, sore throat, sneezing, trouble swallowing and neck pain.   Eyes: Negative for itching and visual disturbance.  Respiratory: Negative for cough.   Cardiovascular: Negative for chest pain, palpitations and leg swelling.  Gastrointestinal: Negative for nausea, diarrhea, blood in stool and abdominal distention.  Genitourinary: Negative for frequency and hematuria.  Musculoskeletal: Positive for back pain. Negative for joint swelling and gait problem.  Skin: Negative for rash.  Neurological: Negative for dizziness, tremors, speech difficulty and weakness.  Psychiatric/Behavioral: Negative for sleep disturbance, dysphoric mood and agitation. The patient is not nervous/anxious.        Objective:   Physical Exam  Constitutional: He is oriented to person, place, and time. He appears well-developed.  HENT:  Mouth/Throat: Oropharynx is clear and moist.  Eyes: Conjunctivae are normal. Pupils are equal, round, and reactive to light.  Neck: Normal range of motion. No JVD present. No thyromegaly present.  Cardiovascular: Normal rate, regular rhythm, normal heart sounds and intact distal pulses.  Exam reveals no gallop and no friction rub.   No murmur heard. Pulmonary/Chest: Effort normal and breath sounds normal. No respiratory distress. He has no wheezes. He has no rales. He exhibits no tenderness.  Abdominal: Soft. Bowel sounds are normal. He  exhibits no distension and no mass. There is no tenderness. There is no rebound and no guarding.  Musculoskeletal: Normal range of motion. He exhibits no edema and no tenderness.  Lymphadenopathy:    He has no cervical adenopathy.  Neurological: He is alert and oriented to person, place, and time. He has normal reflexes. No cranial nerve deficit. He exhibits normal muscle tone. Coordination normal.  Skin: Skin is warm and dry. No rash noted.  Psychiatric: He has a normal mood and affect. His behavior is normal. Judgment and thought content normal.   Lab Results  Component Value Date   WBC 8.4 10/30/2010   HGB 16.3 10/30/2010   HCT 46.6 10/30/2010   PLT 156.0 10/30/2010   GLUCOSE 95 02/11/2011   CHOL 182 01/18/2010   TRIG 258.0* 01/18/2010   HDL 31.00* 01/18/2010   LDLDIRECT 117.3 01/18/2010   LDLCALC 84 12/11/2006   ALT 33 02/11/2011   AST 30 02/11/2011   NA 144 02/11/2011   K 3.7 02/11/2011   CL 109 02/11/2011   CREATININE 1.2 02/11/2011   BUN 16 02/11/2011   CO2 27 02/11/2011   TSH 1.16 01/18/2010   PSA 1.41 10/30/2010   INR 1.1 04/27/2007   HGBA1C 5.7 02/11/2011          Assessment & Plan:

## 2011-02-17 NOTE — Assessment & Plan Note (Signed)
better 

## 2011-02-17 NOTE — Patient Instructions (Signed)
IT band and iliopsoas stretches -- [email or web address]

## 2011-02-17 NOTE — Assessment & Plan Note (Signed)
2012 See meds

## 2011-02-17 NOTE — Assessment & Plan Note (Signed)
Try Amitiza 

## 2011-02-17 NOTE — Assessment & Plan Note (Addendum)
Continue with current prescription therapy as reflected on the Med list. Can use 3 pumps/d

## 2011-02-17 NOTE — Assessment & Plan Note (Signed)
2012 quit

## 2011-02-17 NOTE — Assessment & Plan Note (Signed)
2012 Resolved on diet

## 2011-04-02 ENCOUNTER — Other Ambulatory Visit: Payer: Self-pay | Admitting: Internal Medicine

## 2011-04-03 ENCOUNTER — Telehealth: Payer: Self-pay | Admitting: *Deleted

## 2011-04-03 MED ORDER — CLONAZEPAM 2 MG PO TABS: 2.0000 mg | ORAL_TABLET | Freq: Three times a day (TID) | ORAL | Status: DC | PRN

## 2011-04-03 NOTE — Telephone Encounter (Signed)
Rf req for clonazepam 2 mg tid prn # 90. Ok to Rf?

## 2011-04-03 NOTE — Telephone Encounter (Signed)
OK to fill this prescription with additional refills x5 Thank you!  

## 2011-05-20 ENCOUNTER — Ambulatory Visit (INDEPENDENT_AMBULATORY_CARE_PROVIDER_SITE_OTHER): Payer: Self-pay | Admitting: Internal Medicine

## 2011-05-20 ENCOUNTER — Encounter: Payer: Self-pay | Admitting: Internal Medicine

## 2011-05-20 DIAGNOSIS — N50811 Right testicular pain: Secondary | ICD-10-CM

## 2011-05-20 DIAGNOSIS — N509 Disorder of male genital organs, unspecified: Secondary | ICD-10-CM

## 2011-05-20 DIAGNOSIS — E291 Testicular hypofunction: Secondary | ICD-10-CM

## 2011-05-20 DIAGNOSIS — I10 Essential (primary) hypertension: Secondary | ICD-10-CM

## 2011-05-20 DIAGNOSIS — F319 Bipolar disorder, unspecified: Secondary | ICD-10-CM

## 2011-05-20 DIAGNOSIS — M545 Low back pain: Secondary | ICD-10-CM | POA: Insufficient documentation

## 2011-05-20 MED ORDER — IBUPROFEN 600 MG PO TABS: 600.0000 mg | ORAL_TABLET | Freq: Three times a day (TID) | ORAL | Status: DC | PRN

## 2011-05-20 MED ORDER — CLONAZEPAM 2 MG PO TABS: 2.0000 mg | ORAL_TABLET | Freq: Three times a day (TID) | ORAL | Status: DC | PRN

## 2011-05-20 MED ORDER — TESTOSTERONE 20.25 MG/ACT (1.62%) TD GEL: 3.0000 | TRANSDERMAL | Status: DC

## 2011-05-20 MED ORDER — DOXYCYCLINE HYCLATE 100 MG PO TABS: 100.0000 mg | ORAL_TABLET | Freq: Two times a day (BID) | ORAL | Status: AC

## 2011-05-20 MED ORDER — TRAMADOL HCL 50 MG PO TABS: 50.0000 mg | ORAL_TABLET | Freq: Two times a day (BID) | ORAL | Status: AC | PRN

## 2011-05-20 NOTE — Assessment & Plan Note (Signed)
Continue with current prescription therapy as reflected on the Med list.  

## 2011-05-20 NOTE — Assessment & Plan Note (Signed)
Doxy x 2 wks

## 2011-05-20 NOTE — Progress Notes (Signed)
  Subjective:    Patient ID: [name], male    DOB: 1966/01/15, 46 y.o.   MRN: [id number]  HPI  C/o LBP and R thigh pain; R testicle hurts C/o pain with defecation F/u hypogonadism  Review of Systems  Constitutional: Positive for fatigue. Negative for fever.  HENT: Negative for congestion.   Respiratory: Negative for cough and shortness of breath.   Cardiovascular: Negative for leg swelling.  Gastrointestinal: Positive for constipation. Negative for abdominal pain, blood in stool and rectal pain.  Genitourinary: Negative for flank pain.  Musculoskeletal: Positive for back pain. Negative for gait problem.  Neurological: Negative for headaches.  Psychiatric/Behavioral: Positive for sleep disturbance. Negative for suicidal ideas. The patient is nervous/anxious.        Objective:   Physical Exam  Constitutional: He appears well-developed.  Pulmonary/Chest: He has no wheezes. He has no rales.  Genitourinary:       R testes is sensitive to palpation  Musculoskeletal: He exhibits no edema and no tenderness.  Lymphadenopathy:    He has no cervical adenopathy.  Skin: No erythema.          Assessment & Plan:

## 2011-05-20 NOTE — Assessment & Plan Note (Signed)
Xray, MRI in 1-2 mo Tramadol prn

## 2011-06-27 ENCOUNTER — Encounter (HOSPITAL_COMMUNITY): Payer: Self-pay | Admitting: Emergency Medicine

## 2011-06-27 ENCOUNTER — Emergency Department (HOSPITAL_COMMUNITY): Payer: Self-pay

## 2011-06-27 ENCOUNTER — Emergency Department (HOSPITAL_COMMUNITY)
Admission: EM | Admit: 2011-06-27 | Discharge: 2011-06-27 | Disposition: A | Discharge status: Home / Self Care | Payer: Self-pay | Attending: Emergency Medicine | Admitting: Emergency Medicine

## 2011-06-27 DIAGNOSIS — F988 Other specified behavioral and emotional disorders with onset usually occurring in childhood and adolescence: Secondary | ICD-10-CM | POA: Insufficient documentation

## 2011-06-27 DIAGNOSIS — I10 Essential (primary) hypertension: Secondary | ICD-10-CM | POA: Insufficient documentation

## 2011-06-27 DIAGNOSIS — Z87442 Personal history of urinary calculi: Secondary | ICD-10-CM | POA: Insufficient documentation

## 2011-06-27 DIAGNOSIS — M25559 Pain in unspecified hip: Secondary | ICD-10-CM | POA: Insufficient documentation

## 2011-06-27 DIAGNOSIS — M1610 Unilateral primary osteoarthritis, unspecified hip: Secondary | ICD-10-CM | POA: Insufficient documentation

## 2011-06-27 DIAGNOSIS — F411 Generalized anxiety disorder: Secondary | ICD-10-CM | POA: Insufficient documentation

## 2011-06-27 DIAGNOSIS — M545 Low back pain, unspecified: Secondary | ICD-10-CM | POA: Insufficient documentation

## 2011-06-27 DIAGNOSIS — M169 Osteoarthritis of hip, unspecified: Secondary | ICD-10-CM | POA: Insufficient documentation

## 2011-06-27 DIAGNOSIS — Z79899 Other long term (current) drug therapy: Secondary | ICD-10-CM | POA: Insufficient documentation

## 2011-06-27 DIAGNOSIS — N509 Disorder of male genital organs, unspecified: Secondary | ICD-10-CM | POA: Insufficient documentation

## 2011-06-27 DIAGNOSIS — F329 Major depressive disorder, single episode, unspecified: Secondary | ICD-10-CM | POA: Insufficient documentation

## 2011-06-27 DIAGNOSIS — N50811 Right testicular pain: Secondary | ICD-10-CM

## 2011-06-27 DIAGNOSIS — M1611 Unilateral primary osteoarthritis, right hip: Secondary | ICD-10-CM

## 2011-06-27 DIAGNOSIS — F17200 Nicotine dependence, unspecified, uncomplicated: Secondary | ICD-10-CM | POA: Insufficient documentation

## 2011-06-27 DIAGNOSIS — F3289 Other specified depressive episodes: Secondary | ICD-10-CM | POA: Insufficient documentation

## 2011-06-27 HISTORY — DX: Bipolar disorder, unspecified: F31.9

## 2011-06-27 MED ORDER — HYDROCODONE-ACETAMINOPHEN 5-325 MG PO TABS: 2.0000 | ORAL_TABLET | Freq: Four times a day (QID) | ORAL | Status: DC | PRN

## 2011-06-27 NOTE — ED Provider Notes (Signed)
Medical screening examination/treatment/procedure(s) were performed by non-physician practitioner and as supervising physician I was immediately available for consultation/collaboration.   Julianne Rice, MD 06/27/11 1539

## 2011-06-27 NOTE — ED Notes (Signed)
Patient transported to Ultrasound 

## 2011-06-27 NOTE — Discharge Instructions (Signed)
Please follow up with Dr. [name] for further evaluation of your abdominal pain.  Your scrotal ultrasound today shows no acute changes.  Your hip xray shows evidence of osteoarthritis.  Continue to take ibuprofen for pain.  You may take Norco for break through pain.  Return if you are developing fever, nausea, vomiting, or if you have any other concerns.   Degenerative Arthritis You have osteoarthritis. This is the wear and tear arthritis that comes with aging. It is also called degenerative arthritis. This is common in people past middle age. It is caused by stress on the joints. The large weight bearing joints of the lower extremities are most often affected. The knees, hips, back, neck, and hands can become painful, swollen, and stiff. This is the most common type of arthritis. It comes on with age, carrying too much weight, or from an injury. Treatment includes resting the sore joint until the pain and swelling improve. Crutches or a walker may be needed for severe flares. Only take over-the-counter or prescription medicines for pain, discomfort, or fever as directed by your caregiver. Local heat therapy may improve motion. Cortisone shots into the joint are sometimes used to reduce pain and swelling during flares. Osteoarthritis is usually not crippling and progresses slowly. There are things you can do to decrease pain:  Avoid high impact activities.   Exercise regularly.   Low impact exercises such as walking, biking and swimming help to keep the muscles strong and keep normal joint function.   Stretching helps to keep your range of motion.   Lose weight if you are overweight. This reduces joint stress.  In severe cases when you have pain at rest or increasing disability, joint surgery may be helpful. See your caregiver for follow-up treatment as recommended.  SEEK IMMEDIATE MEDICAL CARE IF:   You have severe joint pain.   Marked swelling and redness in your joint develops.   You  develop a high fever.  Document Released: 04/07/2005 Document Revised: 03/27/2011 Document Reviewed: 09/07/2006 [facility] Patient Information 2012 [place].

## 2011-06-27 NOTE — ED Provider Notes (Signed)
History     CSN: [id number]  Arrival date & time 06/27/11  1145   First MD Initiated Contact with Patient 06/27/11 1235      No chief complaint on file.   (Consider location/radiation/quality/duration/timing/severity/associated sxs/prior treatment) HPI  46 year old male with history of anxiety, history of microscopic hematuria, and history of kidney stones presents with multiple complaints. Patient states that for the past year he has been having pain to his right hip. States pain is constant worsened with movement.  Sts pain felt as if something is grinding in his joint.  Pain worsen with leg abduction.    Patient also complaining of chronic pain to his right testicle that radiates up to his abdomen and down to his right leg. Pain is ongoing for at least 8 months. He does have some right testicle tenderness. He denies of any dysuria, hematuria, or swelling. He denies fever, chest pain, shortness of breath. He does have history of kidney stones but states that his pain felt different. Patient states his primary care doctor is aware of his chronic pain and he has been receiving pain medication in the past. He is currently taking ibuprofen on a daily basis without adequate relief. He is here requesting for better pain control.  Past Medical History  Diagnosis Date  . Anxiety   . Hypertension   . Depression     bipolar [facility]  . Hypogonadism male   . ADD (attention deficit disorder)   . Microscopic hematuria     hereditary s/p Urology eval    No past surgical history on file.  Family History  Problem Relation Age of Onset  . Diabetes Father   . Cancer Mother     died of melanoma with mets  . Cervical cancer Sister   . Diabetes Sister     History  Substance Use Topics  . Smoking status: Current Everyday Smoker  . Smokeless tobacco: Not on file  . Alcohol Use: No      Review of Systems  All other systems reviewed and are negative.    Allergies  Metformin  and related  Home Medications   Current Outpatient Rx  Name Route Sig Dispense Refill  . BUPROPION HCL ER (SR) 150 MG PO TB12 Oral Take 150 mg by mouth 2 (two) times daily.     Marland Kitchen VITAMIN D 1000 UNITS PO TABS Oral Take 1,000 Units by mouth daily.      Marland Kitchen CLONAZEPAM 2 MG PO TABS Oral Take 1 tablet (2 mg total) by mouth 3 (three) times daily as needed for anxiety. 90 tablet 5  . CYANOCOBALAMIN 500 MCG PO TABS Oral Take 500 mcg by mouth daily.      . CYCLOBENZAPRINE HCL 10 MG PO TABS Oral Take 1 tablet (10 mg total) by mouth 2 (two) times daily as needed for muscle spasms. 180 tablet 1  . COLACE PO Oral Take 1 tablet by mouth 2 (two) times daily.     . OMEGA-3 FATTY ACIDS 1000 MG PO CAPS Oral Take 1 g by mouth daily.    . IBUPROFEN 600 MG PO TABS Oral Take 1 tablet (600 mg total) by mouth every 8 (eight) hours as needed for pain. 60 tablet 3  . ONE-DAILY MULTI VITAMINS PO TABS Oral Take 1 tablet by mouth daily.      . QUETIAPINE FUMARATE 300 MG PO TABS Oral Take 300 mg by mouth at bedtime.      . TESTOSTERONE 20.25 MG/1.25GM (1.62%) TD  GEL Transdermal Place 3 application onto the skin daily.    . TRAMADOL HCL 50 MG PO TABS Oral Take 100 mg by mouth every 8 (eight) hours as needed. For pain.      There were no vitals taken for this visit.  Physical Exam  Nursing note and vitals reviewed. Constitutional: He appears well-developed and well-nourished. No distress.       Awake, alert, nontoxic appearance  HENT:  Head: Atraumatic.  Eyes: Conjunctivae are normal. Right eye exhibits no discharge. Left eye exhibits no discharge.  Neck: Normal range of motion. Neck supple.  Cardiovascular: Normal rate and regular rhythm.   Pulmonary/Chest: Effort normal. No respiratory distress. He exhibits no tenderness.  Abdominal: Soft. There is no tenderness. There is no rebound. Hernia confirmed negative in the right inguinal area and confirmed negative in the left inguinal area.  Genitourinary: Penis normal.  Right testis shows tenderness. Right testis shows no mass and no swelling. Left testis shows no mass, no swelling and no tenderness. Circumcised.  Musculoskeletal: He exhibits no tenderness.       ROM appears intact, no obvious focal weakness.  Right hip. Increased pain with range of motion. Pain is diffusely tender in right inguinal region without overlying skin changes. No obvious crepitus noted. No rash noted. Patellar deep tendon reflex is intact.  No evidence of foot drop.  Lymphadenopathy:       Right: No inguinal adenopathy present.       Left: No inguinal adenopathy present.  Neurological: He is alert.  Skin: Skin is warm and dry. No rash noted.  Psychiatric: He has a normal mood and affect.    ED Course  Procedures (including critical care time)  Labs Reviewed - No data to display No results found.   No diagnosis found.  Dg Hip Complete Right  06/27/2011  *RADIOLOGY REPORT*  Clinical Data: Right hip pain  RIGHT HIP - COMPLETE 2+ VIEW  Comparison: None.  Findings: Frontal pelvis with AP and frog-leg lateral views of the right hip show no evidence for an acute fracture.  The patient is status post lower lumbar fusion.  There is near complete loss of joint space in the upper right hip with associated subchondral sclerosis and hypertrophic spurring.  Similar but less advanced changes are noted in the left hip.  No worrisome lytic or sclerotic osseous abnormality.  IMPRESSION: Right hip osteoarthritis.  Original Report Authenticated By: ERIC A. MANSELL, M.D.   US Scrotum  06/27/2011  *RADIOLOGY REPORT*  Clinical Data:  Right testicular pain.  Rule out torsion.  SCROTAL ULTRASOUND DOPPLER ULTRASOUND OF THE TESTICLES  Technique: Complete ultrasound examination of the testicles, epididymis, and other scrotal structures was performed.  Color and spectral Doppler ultrasound were also utilized to evaluate blood flow to the testicles.  Comparison:  None available appear  Findings:  Right testis:   The right testicle is of normal size and echotexture, measuring 5.0 x 2.6 x 4.0 cm.  Left testis:  The left testicle is of normal size and echotexture measuring 4.3 x 2.2 x 3.5 cm.  Right epididymis:  A simple cyst measures 3 mm.  The right epididymis is otherwise within normal limits.  Left epididymis:  A simple cyst measures 5 mm.  The left epididymis is otherwise within normal limits.  Hydocele:  Absent  Varicocele:  Absent  Pulsed Doppler interrogation of both testes demonstrates low resistance flow bilaterally.  IMPRESSION:  1.  Normal appearance of the testicles bilaterally. 2.  Normal  color Doppler flow, arterial, and venous waveforms bilaterally. 3.  Small bilateral spermatoceles within the epididymal heads.  Original Report Authenticated By: Resa Miner. MATTERN, M.D.   [place] Art/ven Flow Abd Pelv Doppler  06/27/2011  *RADIOLOGY REPORT*  Clinical Data:  Right testicular pain.  Rule out torsion.  SCROTAL ULTRASOUND DOPPLER ULTRASOUND OF THE TESTICLES  Technique: Complete ultrasound examination of the testicles, epididymis, and other scrotal structures was performed.  Color and spectral Doppler ultrasound were also utilized to evaluate blood flow to the testicles.  Comparison:  None available appear  Findings:  Right testis:  The right testicle is of normal size and echotexture, measuring 5.0 x 2.6 x 4.0 cm.  Left testis:  The left testicle is of normal size and echotexture measuring 4.3 x 2.2 x 3.5 cm.  Right epididymis:  A simple cyst measures 3 mm.  The right epididymis is otherwise within normal limits.  Left epididymis:  A simple cyst measures 5 mm.  The left epididymis is otherwise within normal limits.  Hydocele:  Absent  Varicocele:  Absent  Pulsed Doppler interrogation of both testes demonstrates low resistance flow bilaterally.  IMPRESSION:  1.  Normal appearance of the testicles bilaterally. 2.  Normal color Doppler flow, arterial, and venous waveforms bilaterally. 3.  Small bilateral  spermatoceles within the epididymal heads.  Original Report Authenticated By: Resa Miner. MATTERN, M.D.      MDM  Patient presents with chronic pain. X-ray of his right hip shows significant signs of osteoarthritis, which explains his chronic pain. I will prescribe a short course of pain medication for his symptoms as ibuprofen has not adequate. Patient also will receive a referral.  Ultrasounds of scrotum shows no acute finding. No evidence of torsion, epididymitis, or orchitis.  Due to the chronicity of his symptoms, I do not believe it is his kidney stones, or infection. Patient was recommended to follow with his regular doctor for further evaluation.        Domenic Moras, PA-C 06/27/11 1428

## 2011-07-22 ENCOUNTER — Encounter: Payer: Self-pay | Admitting: Internal Medicine

## 2011-07-22 ENCOUNTER — Ambulatory Visit (INDEPENDENT_AMBULATORY_CARE_PROVIDER_SITE_OTHER): Payer: Self-pay | Admitting: Internal Medicine

## 2011-07-22 VITALS — BP 160/100 | HR 88 | Temp 98.6°F | Resp 16 | Wt 225.0 lb

## 2011-07-22 DIAGNOSIS — I10 Essential (primary) hypertension: Secondary | ICD-10-CM

## 2011-07-22 DIAGNOSIS — N529 Male erectile dysfunction, unspecified: Secondary | ICD-10-CM

## 2011-07-22 DIAGNOSIS — E291 Testicular hypofunction: Secondary | ICD-10-CM

## 2011-07-22 DIAGNOSIS — M25559 Pain in unspecified hip: Secondary | ICD-10-CM

## 2011-07-22 DIAGNOSIS — M25551 Pain in right hip: Secondary | ICD-10-CM | POA: Insufficient documentation

## 2011-07-22 MED ORDER — HYDROCODONE-ACETAMINOPHEN 7.5-325 MG PO TABS: 1.0000 | ORAL_TABLET | Freq: Four times a day (QID) | ORAL | Status: DC | PRN

## 2011-07-22 NOTE — Assessment & Plan Note (Signed)
Continue with current prescription therapy as reflected on the Med list.  

## 2011-07-22 NOTE — Progress Notes (Signed)
Patient ID: [name], male   DOB: 03-Nov-1965, 46 y.o.   MRN: [id number]  Subjective:    Patient ID: [name], male    DOB: 07-Jan-1966, 46 y.o.   MRN: [id number]  Hip Pain     The patient presents for a follow-up of  Hypogonadism, chronic hypertension, chronic dyslipidemia, type 2 diabetes controlled with medicines. C/o ED  C/o R groin and R thigh pain - worse - he went to ER  Had xrays -- severe OA C/o being more depressed. Not working. Seeing a therapist... C/o severe pain in R hip worse with standing  Wt Readings from Last 3 Encounters:  07/22/11 225 lb (102.059 kg)  05/20/11 227 lb (102.967 kg)  02/17/11 214 lb (97.07 kg)    BP Readings from Last 3 Encounters:  07/22/11 160/100  06/27/11 128/83  05/20/11 138/80    Review of Systems  Constitutional: Negative for appetite change, fatigue and unexpected weight change.  HENT: Negative for nosebleeds, congestion, sore throat, sneezing, trouble swallowing and neck pain.   Eyes: Negative for itching and visual disturbance.  Respiratory: Negative for cough.   Cardiovascular: Negative for chest pain, palpitations and leg swelling.  Gastrointestinal: Negative for nausea, diarrhea, blood in stool and abdominal distention.  Genitourinary: Negative for frequency and hematuria.  Musculoskeletal: Positive for back pain. Negative for joint swelling and gait problem.  Skin: Negative for rash.  Neurological: Negative for dizziness, tremors, speech difficulty and weakness.  Psychiatric/Behavioral: Positive for behavioral problems, sleep disturbance and dysphoric mood. Negative for suicidal ideas and agitation. The patient is nervous/anxious.        Objective:   Physical Exam  Constitutional: He is oriented to person, place, and time. He appears well-developed. No distress.  HENT:  Mouth/Throat: Oropharynx is clear and moist.  Eyes: Conjunctivae are normal. Pupils are equal, round, and reactive to light.  Neck: Normal range of  motion. No JVD present. No thyromegaly present.  Cardiovascular: Normal rate, regular rhythm, normal heart sounds and intact distal pulses.  Exam reveals no gallop and no friction rub.   No murmur heard. Pulmonary/Chest: Effort normal and breath sounds normal. No respiratory distress. He has no wheezes. He has no rales. He exhibits no tenderness.  Abdominal: Soft. Bowel sounds are normal. He exhibits no distension and no mass. There is no tenderness. There is no rebound and no guarding.  Musculoskeletal: Normal range of motion. He exhibits tenderness (B hips). He exhibits no edema.       B hips are tender w/ROM and stiff w/restricted ROM  Lymphadenopathy:    He has no cervical adenopathy.  Neurological: He is alert and oriented to person, place, and time. He has normal reflexes. No cranial nerve deficit. He exhibits normal muscle tone. Coordination normal.  Skin: Skin is warm and dry. No rash noted.  Psychiatric: His behavior is normal. Judgment and thought content normal.       Upset, depressed   Lab Results  Component Value Date   WBC 8.4 10/30/2010   HGB 16.3 10/30/2010   HCT 46.6 10/30/2010   PLT 156.0 10/30/2010   GLUCOSE 95 02/11/2011   CHOL 182 01/18/2010   TRIG 258.0* 01/18/2010   HDL 31.00* 01/18/2010   LDLDIRECT 117.3 01/18/2010   LDLCALC 84 12/11/2006   ALT 33 02/11/2011   AST 30 02/11/2011   NA 144 02/11/2011   K 3.7 02/11/2011   CL 109 02/11/2011   CREATININE 1.2 02/11/2011   BUN 16 02/11/2011   CO2  27 02/11/2011   TSH 1.16 01/18/2010   PSA 1.41 10/30/2010   INR 1.1 04/27/2007   HGBA1C 5.7 02/11/2011    RADIOLOGY REPORT*  Clinical Data: Right hip pain  RIGHT HIP - COMPLETE 2+ VIEW  Comparison: None.  Findings: Frontal pelvis with AP and frog-leg lateral views of the  right hip show no evidence for an acute fracture. The patient is  status post lower lumbar fusion. There is near complete loss of  joint space in the upper right hip with associated subchondral  sclerosis  and hypertrophic spurring. Similar but less advanced  changes are noted in the left hip. No worrisome lytic or sclerotic  osseous abnormality.  IMPRESSION:  Right hip osteoarthritis.       Assessment & Plan:

## 2011-07-22 NOTE — Assessment & Plan Note (Addendum)
RADIOLOGY REPORT*  Clinical Data: Right hip pain  RIGHT HIP - COMPLETE 2+ VIEW  Comparison: None.  Findings: Frontal pelvis with AP and frog-leg lateral views of the  right hip show no evidence for an acute fracture. The patient is  status post lower lumbar fusion. There is near complete loss of  joint space in the upper right hip with associated subchondral  sclerosis and hypertrophic spurring. Similar but less advanced  changes are noted in the left hip. No worrisome lytic or sclerotic  osseous abnormality.  IMPRESSION:  Right hip osteoarthritis.  Start Vicodin prn

## 2011-07-31 ENCOUNTER — Other Ambulatory Visit: Payer: Self-pay | Admitting: Internal Medicine

## 2011-08-05 ENCOUNTER — Ambulatory Visit: Payer: Self-pay | Admitting: Internal Medicine

## 2011-08-18 ENCOUNTER — Other Ambulatory Visit: Payer: Self-pay | Admitting: *Deleted

## 2011-08-18 NOTE — Telephone Encounter (Signed)
Pt states the pain med he was prescribed last is not working. He states he has to take more to get rid of pain and therefore he runs out early. He has been taking the man that has liver cancer meds. He states he has better results with Morphine Sulfate 15 mg and Hydromorphone. He states he also used a Fentanyl Patch 50 mg but doesn't want a Rx for this.   Also, He is requesting to up the dosage of his clonazepam OR if there is a better anxiety med he can take. AND he is requesting a handicap placard form be completed.

## 2011-08-19 NOTE — Telephone Encounter (Signed)
It is unacceptable to take another person's pain killers. Let me know when out of Hydroc/APAP refills Thx

## 2011-08-19 NOTE — Telephone Encounter (Signed)
It is inappropriate to take someone else's opioids

## 2011-08-20 NOTE — Telephone Encounter (Signed)
Pt informed. He understands and agrees. Please advise on Clonazepam.

## 2011-08-25 ENCOUNTER — Telehealth: Payer: Self-pay | Admitting: *Deleted

## 2011-08-25 NOTE — Telephone Encounter (Signed)
OK to fill this prescription with additional refills x1 Thank you!  

## 2011-08-25 NOTE — Telephone Encounter (Signed)
Pt is requesting Rf clonazepam. Please advise.

## 2011-08-26 MED ORDER — TADALAFIL 5 MG PO TABS: 5.0000 mg | ORAL_TABLET | Freq: Every day | ORAL | Status: DC

## 2011-08-26 MED ORDER — CLONAZEPAM 2 MG PO TABS: 2.0000 mg | ORAL_TABLET | Freq: Three times a day (TID) | ORAL | Status: DC | PRN

## 2011-08-26 NOTE — Telephone Encounter (Signed)
Done- left detailed mess informing pt of this and that his Cialis is upfront from [place] pt assistance.

## 2011-09-19 ENCOUNTER — Other Ambulatory Visit: Payer: Self-pay

## 2011-09-22 ENCOUNTER — Telehealth: Payer: Self-pay | Admitting: *Deleted

## 2011-09-22 MED ORDER — HYDROCODONE-ACETAMINOPHEN 7.5-325 MG PO TABS: 1.0000 | ORAL_TABLET | Freq: Four times a day (QID) | ORAL | Status: DC | PRN

## 2011-09-22 NOTE — Telephone Encounter (Signed)
done

## 2011-09-22 NOTE — Telephone Encounter (Signed)
OK to fill this prescription with additional refills x1 Thank you!  

## 2011-09-22 NOTE — Telephone Encounter (Signed)
Rf req for Hydroco/APAP 7.5/325mg  1 po qid prn pain. #60. Ok to Rf?

## 2011-10-21 ENCOUNTER — Telehealth: Payer: Self-pay

## 2011-10-21 ENCOUNTER — Ambulatory Visit: Payer: Self-pay | Admitting: Internal Medicine

## 2011-10-21 NOTE — Telephone Encounter (Signed)
Please advise if ok to refill clonazepam 2 mg bid Thanks

## 2011-10-22 NOTE — Telephone Encounter (Signed)
OK to fill this prescription with additional refills x2 Needs OV Thank you!

## 2011-10-31 ENCOUNTER — Encounter (HOSPITAL_COMMUNITY): Payer: Self-pay | Admitting: *Deleted

## 2011-10-31 ENCOUNTER — Emergency Department (HOSPITAL_COMMUNITY)
Admission: EM | Admit: 2011-10-31 | Discharge: 2011-11-01 | Disposition: A | Discharge status: Home / Self Care | Payer: Self-pay | Attending: Emergency Medicine | Admitting: Emergency Medicine

## 2011-10-31 DIAGNOSIS — F17200 Nicotine dependence, unspecified, uncomplicated: Secondary | ICD-10-CM | POA: Insufficient documentation

## 2011-10-31 DIAGNOSIS — F319 Bipolar disorder, unspecified: Secondary | ICD-10-CM | POA: Insufficient documentation

## 2011-10-31 DIAGNOSIS — K047 Periapical abscess without sinus: Secondary | ICD-10-CM | POA: Insufficient documentation

## 2011-10-31 DIAGNOSIS — Z8049 Family history of malignant neoplasm of other genital organs: Secondary | ICD-10-CM | POA: Insufficient documentation

## 2011-10-31 DIAGNOSIS — M129 Arthropathy, unspecified: Secondary | ICD-10-CM | POA: Insufficient documentation

## 2011-10-31 DIAGNOSIS — F988 Other specified behavioral and emotional disorders with onset usually occurring in childhood and adolescence: Secondary | ICD-10-CM | POA: Insufficient documentation

## 2011-10-31 DIAGNOSIS — Z833 Family history of diabetes mellitus: Secondary | ICD-10-CM | POA: Insufficient documentation

## 2011-10-31 DIAGNOSIS — I10 Essential (primary) hypertension: Secondary | ICD-10-CM | POA: Insufficient documentation

## 2011-10-31 DIAGNOSIS — F411 Generalized anxiety disorder: Secondary | ICD-10-CM | POA: Insufficient documentation

## 2011-10-31 HISTORY — DX: Unspecified osteoarthritis, unspecified site: M19.90

## 2011-10-31 NOTE — ED Notes (Signed)
Pt states that he chipped his tooth about a few weeks ago right lower. Today started having pain, swelling while at work.

## 2011-10-31 NOTE — ED Notes (Signed)
Patient c/o pain and swelling to bottom right side of face due to a bad tooth (cracked).  Has not taken any meds at home.

## 2011-11-01 ENCOUNTER — Emergency Department (HOSPITAL_COMMUNITY)
Admission: EM | Admit: 2011-11-01 | Discharge: 2011-11-01 | Disposition: A | Discharge status: Home / Self Care | Payer: Self-pay | Attending: Emergency Medicine | Admitting: Emergency Medicine

## 2011-11-01 ENCOUNTER — Emergency Department (HOSPITAL_COMMUNITY): Payer: Self-pay

## 2011-11-01 ENCOUNTER — Encounter (HOSPITAL_COMMUNITY): Payer: Self-pay | Admitting: Emergency Medicine

## 2011-11-01 DIAGNOSIS — Z8739 Personal history of other diseases of the musculoskeletal system and connective tissue: Secondary | ICD-10-CM | POA: Insufficient documentation

## 2011-11-01 DIAGNOSIS — F17200 Nicotine dependence, unspecified, uncomplicated: Secondary | ICD-10-CM | POA: Insufficient documentation

## 2011-11-01 DIAGNOSIS — Z79899 Other long term (current) drug therapy: Secondary | ICD-10-CM | POA: Insufficient documentation

## 2011-11-01 DIAGNOSIS — F319 Bipolar disorder, unspecified: Secondary | ICD-10-CM | POA: Insufficient documentation

## 2011-11-01 DIAGNOSIS — F341 Dysthymic disorder: Secondary | ICD-10-CM | POA: Insufficient documentation

## 2011-11-01 DIAGNOSIS — K047 Periapical abscess without sinus: Secondary | ICD-10-CM | POA: Insufficient documentation

## 2011-11-01 DIAGNOSIS — I10 Essential (primary) hypertension: Secondary | ICD-10-CM | POA: Insufficient documentation

## 2011-11-01 DIAGNOSIS — F988 Other specified behavioral and emotional disorders with onset usually occurring in childhood and adolescence: Secondary | ICD-10-CM | POA: Insufficient documentation

## 2011-11-01 LAB — BASIC METABOLIC PANEL
BUN: 16 mg/dL (ref 6–23)
CO2: 28 mEq/L (ref 19–32)
Calcium: 9.3 mg/dL (ref 8.4–10.5)
Chloride: 105 mEq/L (ref 96–112)
Creatinine, Ser: 1 mg/dL (ref 0.50–1.35)
GFR calc Af Amer: >90 mL/min (ref >90)
GFR calc non Af Amer: >90 mL/min (ref >90)
Glucose, Bld: 153 mg/dL — ABNORMAL HIGH (ref 70–99)
Potassium: 4.9 mEq/L (ref 3.5–5.1)
Sodium: 141 mEq/L (ref 135–145)

## 2011-11-01 LAB — CBC
HCT: 39 % (ref 39.0–52.0)
Hemoglobin: 14.2 g/dL (ref 13.0–17.0)
MCH: 31.5 pg (ref 26.0–34.0)
MCHC: 36.4 g/dL — ABNORMAL HIGH (ref 30.0–36.0)
MCV: 86.5 fL (ref 78.0–100.0)
Platelets: 121 10*3/uL — ABNORMAL LOW (ref 150–400)
RBC: 4.51 MIL/uL (ref 4.22–5.81)
RDW: 12.5 % (ref 11.5–15.5)
WBC: 11.4 10*3/uL — ABNORMAL HIGH (ref 4.0–10.5)

## 2011-11-01 MED ORDER — HYDROMORPHONE HCL PF 1 MG/ML IJ SOLN
1.0000 mg | Freq: Once | INTRAMUSCULAR | Status: AC
  Administered 2011-11-01: 1 mg via INTRAVENOUS
  Filled 2011-11-01: qty 1

## 2011-11-01 MED ORDER — IOHEXOL 300 MG/ML  SOLN
100.0000 mL | Freq: Once | INTRAMUSCULAR | Status: AC | PRN
  Administered 2011-11-01: 100 mL via INTRAVENOUS

## 2011-11-01 MED ORDER — KETOROLAC TROMETHAMINE 15 MG/ML IJ SOLN
15.0000 mg | Freq: Once | INTRAMUSCULAR | Status: AC
  Filled 2011-11-01: qty 1

## 2011-11-01 MED ORDER — CLINDAMYCIN PHOSPHATE 900 MG/50ML IV SOLN
900.0000 mg | Freq: Once | INTRAVENOUS | Status: AC
  Administered 2011-11-01: 900 mg via INTRAVENOUS
  Filled 2011-11-01: qty 50

## 2011-11-01 MED ORDER — HYDROCODONE-ACETAMINOPHEN 5-325 MG PO TABS
2.0000 | ORAL_TABLET | Freq: Once | ORAL | Status: AC
  Administered 2011-11-01: 2 via ORAL
  Filled 2011-11-01: qty 2

## 2011-11-01 MED ORDER — HYDROMORPHONE HCL PF 1 MG/ML IJ SOLN
1.0000 mg | Freq: Once | INTRAMUSCULAR | Status: AC
  Administered 2011-11-01: 1 mg via INTRAMUSCULAR
  Filled 2011-11-01: qty 1

## 2011-11-01 MED ORDER — KETOROLAC TROMETHAMINE 30 MG/ML IJ SOLN
INTRAMUSCULAR | Status: AC
  Administered 2011-11-01: 15 mg
  Filled 2011-11-01: qty 1

## 2011-11-01 MED ORDER — SODIUM CHLORIDE 0.9 % IV BOLUS (SEPSIS)
1000.0000 mL | Freq: Once | INTRAVENOUS | Status: AC
  Administered 2011-11-01: 500 mL via INTRAVENOUS

## 2011-11-01 MED ORDER — HYDROCODONE-ACETAMINOPHEN 5-325 MG PO TABS: 1.0000 | ORAL_TABLET | ORAL | Status: DC | PRN

## 2011-11-01 MED ORDER — PENICILLIN V POTASSIUM 500 MG PO TABS: 500.0000 mg | ORAL_TABLET | Freq: Four times a day (QID) | ORAL | Status: AC

## 2011-11-01 MED ORDER — ONDANSETRON HCL 4 MG/2ML IJ SOLN
4.0000 mg | Freq: Once | INTRAMUSCULAR | Status: AC
  Administered 2011-11-01: 4 mg via INTRAVENOUS
  Filled 2011-11-01: qty 2

## 2011-11-01 NOTE — ED Provider Notes (Signed)
History    45yM with facial pain. R sided. Onset about 2w ago and worsening. Cracked R lower molar. Seen in last night. Pain worsening today and now febrile. Given script for PCN but says has been vomiting it. No difficulty breathing or swallowing but some difficulty in fully opening mouth. No change in voice. Denies hx of diabetes.  CSN: [id number]  Arrival date & time 11/01/11  1612   First MD Initiated Contact with Patient 11/01/11 1633      Chief Complaint  Patient presents with  . Dental Pain    right lower    (Consider location/radiation/quality/duration/timing/severity/associated sxs/prior treatment) HPI  Past Medical History  Diagnosis Date  . Anxiety   . Hypertension   . Depression     bipolar [facility]  . Hypogonadism male   . ADD (attention deficit disorder)   . Microscopic hematuria     hereditary s/p Urology eval  . Bipolar 1 disorder   . Arthritis     right hip    Past Surgical History  Procedure Date  . Lumbar disc surgery     Family History  Problem Relation Age of Onset  . Diabetes Father   . Cancer Mother     died of melanoma with mets  . Cervical cancer Sister   . Diabetes Sister     History  Substance Use Topics  . Smoking status: Current Everyday Smoker    Types: Cigarettes  . Smokeless tobacco: Not on file  . Alcohol Use: No      Review of Systems   Review of symptoms negative unless otherwise noted in HPI.   Allergies  Review of patient's allergies indicates no known allergies.  Home Medications   Current Outpatient Rx  Name Route Sig Dispense Refill  . BUPROPION HCL ER (SR) 150 MG PO TB12 Oral Take 150 mg by mouth 2 (two) times daily.     Marland Kitchen VITAMIN D 1000 UNITS PO TABS Oral Take 1,000 Units by mouth daily.      Marland Kitchen CLONAZEPAM 2 MG PO TABS Oral Take 2 mg by mouth 3 (three) times daily as needed. For anxiety    . CYANOCOBALAMIN 500 MCG PO TABS Oral Take 500 mcg by mouth daily.     Marland Kitchen DIVALPROEX SODIUM ER 250 MG PO TB24  Oral Take 250 mg by mouth at bedtime.    Marland Kitchen COLACE PO Oral Take 3 tablets by mouth at bedtime.     Marland Kitchen GLUCOSAMINE MSM COMPLEX PO Oral Take 1 capsule by mouth 2 (two) times daily.    Marland Kitchen HYDROCODONE-ACETAMINOPHEN 5-325 MG PO TABS Oral Take 1-2 tablets by mouth every 4 (four) hours as needed for pain. 20 tablet 0  . IBUPROFEN 200 MG PO TABS Oral Take 600 mg by mouth 2 (two) times daily.    Marland Kitchen LISDEXAMFETAMINE DIMESYLATE 20 MG PO CAPS Oral Take 20 mg by mouth every morning.    Marland Kitchen ONE-DAILY MULTI VITAMINS PO TABS Oral Take 1 tablet by mouth daily.      Marland Kitchen PENICILLIN V POTASSIUM 500 MG PO TABS Oral Take 1 tablet (500 mg total) by mouth 4 (four) times daily. 40 tablet 0  . QUETIAPINE FUMARATE ER 300 MG PO TB24 Oral Take 300 mg by mouth at bedtime.    Marland Kitchen TADALAFIL 5 MG PO TABS Oral Take 5 mg by mouth daily as needed.    . TESTOSTERONE 20.25 MG/1.25GM (1.62%) TD GEL Transdermal Place 3 application onto the skin daily.    Marland Kitchen  TRAMADOL HCL 50 MG PO TABS Oral Take 50 mg by mouth every 6 (six) hours as needed. For pain    . HYDROCODONE-ACETAMINOPHEN 7.5-325 MG PO TABS Oral Take 1 tablet by mouth 4 (four) times daily as needed. For pain      BP 143/82  Pulse 106  Temp 101.3 F (38.5 C) (Oral)  Resp 22  SpO2 96%  Physical Exam  Nursing note and vitals reviewed. Constitutional: He appears well-developed and well-nourished. No distress.  HENT:  Head: Normocephalic and atraumatic.       Posterior pharynx clear. Uvula midline. Soft tissue swelling and tenderness R cheek. Cracked R lower molar. No obvious drainable collection.  Mild trismus. No tongue elevation. Submental tissues soft. No stridor. No dysphonia. Handling secretions.  Eyes: Conjunctivae are normal. Right eye exhibits no discharge. Left eye exhibits no discharge.  Neck: Neck supple.  Cardiovascular: Regular rhythm and normal heart sounds.  Exam reveals no gallop and no friction rub.   No murmur heard.      Tachycardic with reg rhythm    Pulmonary/Chest: Effort normal and breath sounds normal. No respiratory distress.  Abdominal: Soft. He exhibits no distension. There is no tenderness.  Musculoskeletal: He exhibits no edema and no tenderness.  Lymphadenopathy:    He has no cervical adenopathy.  Neurological: He is alert.  Skin: Skin is warm and dry. He is not diaphoretic.  Psychiatric: He has a normal mood and affect. His behavior is normal. Thought content normal.    ED Course  Procedures (including critical care time)  Labs Reviewed  CBC - Abnormal; Notable for the following:    WBC 11.4 (*)     MCHC 36.4 (*)     Platelets 121 (*)     All other components within normal limits  BASIC METABOLIC PANEL - Abnormal; Notable for the following:    Glucose, Bld 153 (*)     All other components within normal limits   Ct Maxillofacial W/cm  11/01/2011  *RADIOLOGY REPORT*  Clinical Data: Right facial swelling  CT MAXILLOFACIAL WITH CONTRAST  Technique:  Multidetector CT imaging of the maxillofacial structures was performed with intravenous contrast. Multiplanar CT image reconstructions were also generated.  Contrast: 138mL OMNIPAQUE IOHEXOL 300 MG/ML  SOLN  Comparison: None.  Findings: There is extensive right facial cellulitis without focal soft tissue abscess.  There is a subcentimeter periapical abscess associated with a right lower mandibular tooth (tooth 29). This tooth also displays significant dental caries.  Similar less severe changes are seen on tooth 19 in the left mandible.  No significant sinus opacity.  No cortical breakthrough to suggest advanced osteomyelitis.  Airway midline.  Shotty cervical adenopathy.  Major vascular structures appear patent.  IMPRESSION: Periapical abscess and advanced dental caries of tooth 29, right mandible,  likely account for the patient's right facial cellulitis. No focal soft tissue facial abscess.  See comments above.  Original Report Authenticated By: Staci Righter, M.D.     1.  Periapical abscess       MDM  45yM with R facial pain and swelling. CT significant for periapical abscess. Mild trismus but no evidence of ludwig's or airway compromise. Discussed with dentistry prior to DC and can see pt in office on Monday. Pt provided scripts for pain meds and abx yesterday. Return precautions dicussed.      Virgel Manifold, MD 11/01/11 [phone]

## 2011-11-01 NOTE — ED Provider Notes (Signed)
History     CSN: [id number]  Arrival date & time 10/31/11  2218   First MD Initiated Contact with Patient 11/01/11 0013      Chief Complaint  Patient presents with  . Dental Pain    (Consider location/radiation/quality/duration/timing/severity/associated sxs/prior treatment) HPI Comments: [name] chipped his 1st molar on his lower right 2 weeks ago while eating crunchy foods such as peanuts. [name] has been diligent about keeping the chipped tooth clean by flossing frequently. [name] started noticing pain and swelling of the right submandibular region around 7pm this evening after eating pizza. [name] describes the pain as constant and 10 out of 10 on the pain scale. His last dental checkup was 1 year ago. Past dental history is significant for 3 root canals, 2 of which failed and ended up being extracted. His last remaining root canal is the 2nd molar on the the lower right. [name] denies difficulty breathing.    Patient is a 46 y.o. male presenting with tooth pain. The history is provided by the patient.  Dental PainPrimary symptoms do not include fever, shortness of breath or sore throat.  Additional symptoms include: facial swelling.    Past Medical History  Diagnosis Date  . Anxiety   . Hypertension   . Depression     bipolar [facility]  . Hypogonadism male   . ADD (attention deficit disorder)   . Microscopic hematuria     hereditary s/p Urology eval  . Bipolar 1 disorder   . Arthritis     right hip    Past Surgical History  Procedure Date  . Lumbar disc surgery     Family History  Problem Relation Age of Onset  . Diabetes Father   . Cancer Mother     died of melanoma with mets  . Cervical cancer Sister   . Diabetes Sister     History  Substance Use Topics  . Smoking status: Current Everyday Smoker    Types: Cigarettes  . Smokeless tobacco: Not on file  . Alcohol Use: No      Review of Systems  Constitutional: Negative for fever and chills.  HENT: Positive  for facial swelling and dental problem. Negative for sore throat, mouth sores and neck pain.   Respiratory: Negative for shortness of breath, wheezing and stridor.     Allergies  Review of patient's allergies indicates no known allergies.  Home Medications   Current Outpatient Rx  Name Route Sig Dispense Refill  . BUPROPION HCL ER (SR) 150 MG PO TB12 Oral Take 150 mg by mouth 2 (two) times daily.     Marland Kitchen VITAMIN D 1000 UNITS PO TABS Oral Take 1,000 Units by mouth daily.      Marland Kitchen CLONAZEPAM 2 MG PO TABS Oral Take 2 mg by mouth 3 (three) times daily as needed. For anxiety    . CYANOCOBALAMIN 500 MCG PO TABS Oral Take 500 mcg by mouth daily.     Marland Kitchen DIVALPROEX SODIUM ER 250 MG PO TB24 Oral Take 250 mg by mouth at bedtime.    Marland Kitchen COLACE PO Oral Take 3 tablets by mouth at bedtime.     Marland Kitchen GLUCOSAMINE MSM COMPLEX PO Oral Take 1 capsule by mouth 2 (two) times daily.    Marland Kitchen HYDROCODONE-ACETAMINOPHEN 7.5-325 MG PO TABS Oral Take 1 tablet by mouth 4 (four) times daily as needed. For pain    . IBUPROFEN 200 MG PO TABS Oral Take 600 mg by mouth 2 (two) times daily.    Marland Kitchen  LISDEXAMFETAMINE DIMESYLATE 20 MG PO CAPS Oral Take 20 mg by mouth every morning.    Marland Kitchen ONE-DAILY MULTI VITAMINS PO TABS Oral Take 1 tablet by mouth daily.      . QUETIAPINE FUMARATE ER 300 MG PO TB24 Oral Take 300 mg by mouth at bedtime.    Marland Kitchen TADALAFIL 5 MG PO TABS Oral Take 5 mg by mouth daily as needed.    . TESTOSTERONE 20.25 MG/1.25GM (1.62%) TD GEL Transdermal Place 3 application onto the skin daily.    . TRAMADOL HCL 50 MG PO TABS Oral Take 50 mg by mouth every 6 (six) hours as needed. For pain      BP 136/90  Pulse 83  Temp 98.9 F (37.2 C) (Oral)  Resp 16  SpO2 98%  Physical Exam  Nursing note and vitals reviewed. Constitutional: [name] is oriented to person, place, and time. [name] appears well-developed and well-nourished.  HENT:  Head: Normocephalic and atraumatic.  Mouth/Throat: Uvula is midline and oropharynx is clear and moist.  Mucous membranes are not dry. No uvula swelling. No oropharyngeal exudate, posterior oropharyngeal edema, posterior oropharyngeal erythema or tonsillar abscesses.    Neck: Neck supple.  Pulmonary/Chest: Effort normal.  Neurological: [name] is alert and oriented to person, place, and time.    ED Course  Procedures (including critical care time)  Labs Reviewed - No data to display No results found.   1. Dental abscess       MDM  Afebrile, nontoxic patient with dental abscess, no airway concerns no clinical concern for ludwig's angina.  Pain treated in ED.  Pt d/c home with penicillin, vicodin, dental follow up.  Return precautions discussed.  Patient verbalizes understanding and agrees with plan.           [place], [place] 11/01/11 [phone]

## 2011-11-01 NOTE — ED Notes (Addendum)
Pt presents with swelling to right side of face. Reports toothache/abscess onset yesterday. Pt seen here for same and told to return if got worse. Pt reports unable to tolerate PO medications, food or fluids.

## 2011-11-01 NOTE — ED Notes (Signed)
Pt vomited in triage and reports, "I feel like I'm going to pass out."

## 2011-11-01 NOTE — ED Notes (Signed)
Pt and family member given a coke and a cup of ice

## 2011-11-01 NOTE — ED Provider Notes (Signed)
Medical screening examination/treatment/procedure(s) were performed by non-physician practitioner and as supervising physician I was immediately available for consultation/collaboration.    Johnna Acosta, MD 11/01/11 [phone]

## 2011-11-17 ENCOUNTER — Other Ambulatory Visit: Payer: Self-pay | Admitting: Internal Medicine

## 2011-11-17 NOTE — Telephone Encounter (Signed)
Ok to Rf? 

## 2011-11-17 NOTE — Telephone Encounter (Signed)
Also, Rf req for Clonazepam 2 mg 1 po tid prn. # 90. Last filled 10/21/11. Ok to RF?

## 2011-11-19 ENCOUNTER — Telehealth: Payer: Self-pay | Admitting: *Deleted

## 2011-11-19 NOTE — Telephone Encounter (Signed)
Rf req for Clonazepam 2 mg 1 po tid prn anxiety. Ok to Rf?

## 2011-11-20 MED ORDER — CLONAZEPAM 2 MG PO TABS: 2.0000 mg | ORAL_TABLET | Freq: Three times a day (TID) | ORAL | Status: DC | PRN

## 2011-11-20 NOTE — Telephone Encounter (Signed)
Done. Has OV scheduled 11/28/11.

## 2011-11-20 NOTE — Telephone Encounter (Signed)
OK to fill this prescription with additional refills x1 Needs ov Thank you!

## 2011-11-28 ENCOUNTER — Telehealth: Payer: Self-pay | Admitting: *Deleted

## 2011-11-28 ENCOUNTER — Ambulatory Visit (INDEPENDENT_AMBULATORY_CARE_PROVIDER_SITE_OTHER): Payer: Self-pay | Admitting: Internal Medicine

## 2011-11-28 ENCOUNTER — Encounter: Payer: Self-pay | Admitting: Internal Medicine

## 2011-11-28 VITALS — BP 138/80 | HR 80 | Temp 98.1°F | Resp 16 | Wt 211.0 lb

## 2011-11-28 DIAGNOSIS — F319 Bipolar disorder, unspecified: Secondary | ICD-10-CM

## 2011-11-28 DIAGNOSIS — M1611 Unilateral primary osteoarthritis, right hip: Secondary | ICD-10-CM

## 2011-11-28 DIAGNOSIS — F411 Generalized anxiety disorder: Secondary | ICD-10-CM

## 2011-11-28 DIAGNOSIS — I10 Essential (primary) hypertension: Secondary | ICD-10-CM

## 2011-11-28 DIAGNOSIS — E119 Type 2 diabetes mellitus without complications: Secondary | ICD-10-CM

## 2011-11-28 DIAGNOSIS — K5904 Chronic idiopathic constipation: Secondary | ICD-10-CM

## 2011-11-28 DIAGNOSIS — M169 Osteoarthritis of hip, unspecified: Secondary | ICD-10-CM

## 2011-11-28 DIAGNOSIS — K5909 Other constipation: Secondary | ICD-10-CM

## 2011-11-28 HISTORY — DX: Unilateral primary osteoarthritis, right hip: M16.11

## 2011-11-28 MED ORDER — CELECOXIB 200 MG PO CAPS: 200.0000 mg | ORAL_CAPSULE | Freq: Two times a day (BID) | ORAL | Status: DC

## 2011-11-28 MED ORDER — CLONAZEPAM 2 MG PO TABS: 2.0000 mg | ORAL_TABLET | Freq: Four times a day (QID) | ORAL | Status: DC | PRN

## 2011-11-28 MED ORDER — HYDROCODONE-ACETAMINOPHEN 7.5-325 MG PO TABS: 1.0000 | ORAL_TABLET | Freq: Four times a day (QID) | ORAL | Status: DC | PRN

## 2011-11-28 NOTE — Assessment & Plan Note (Signed)
Continue with current prescription therapy as reflected on the Med list.  

## 2011-11-28 NOTE — Assessment & Plan Note (Signed)
He needs surgery - no $$$ now Pain meds

## 2011-11-28 NOTE — Progress Notes (Signed)
Subjective:    Patient ID: [name], male    DOB: [birth year], 46 y.o.   MRN: [id number]  Hip Pain     The patient presents for a follow-up of hypogonadism, chronic hypertension, chronic dyslipidemia, type 2 diabetes controlled with medicines. C/o ED  C/o R groin and R thigh pain - worse -  xrays -- severe OA C/o being more depressed. Not working. Seeing a therapist... C/o severe pain in R hip worse with standing  Wt Readings from Last 3 Encounters:  11/28/11 211 lb (95.709 kg)  07/22/11 225 lb (102.059 kg)  05/20/11 227 lb (102.967 kg)    BP Readings from Last 3 Encounters:  11/28/11 138/80  11/01/11 131/60  10/31/11 136/90    Review of Systems  Constitutional: Negative for appetite change, fatigue and unexpected weight change.  HENT: Negative for nosebleeds, congestion, sore throat, sneezing, trouble swallowing and neck pain.   Eyes: Negative for itching and visual disturbance.  Respiratory: Negative for cough.   Cardiovascular: Negative for chest pain, palpitations and leg swelling.  Gastrointestinal: Negative for nausea, diarrhea, blood in stool and abdominal distention.  Genitourinary: Negative for frequency and hematuria.  Musculoskeletal: Positive for back pain. Negative for joint swelling and gait problem.  Skin: Negative for rash.  Neurological: Negative for dizziness, tremors, speech difficulty and weakness.  Psychiatric/Behavioral: Positive for behavioral problems, disturbed wake/sleep cycle and dysphoric mood. Negative for suicidal ideas and agitation. The patient is nervous/anxious.        Objective:   Physical Exam  Constitutional: He is oriented to person, place, and time. He appears well-developed. No distress.  HENT:  Mouth/Throat: Oropharynx is clear and moist.  Eyes: Conjunctivae are normal. Pupils are equal, round, and reactive to light.  Neck: Normal range of motion. No JVD present. No thyromegaly present.  Cardiovascular: Normal rate, regular  rhythm, normal heart sounds and intact distal pulses.  Exam reveals no gallop and no friction rub.   No murmur heard. Pulmonary/Chest: Effort normal and breath sounds normal. No respiratory distress. He has no wheezes. He has no rales. He exhibits no tenderness.  Abdominal: Soft. Bowel sounds are normal. He exhibits no distension and no mass. There is no tenderness. There is no rebound and no guarding.  Musculoskeletal: Normal range of motion. He exhibits tenderness (B hips). He exhibits no edema.       B hips are tender w/ROM and stiff w/restricted ROM  Lymphadenopathy:    He has no cervical adenopathy.  Neurological: He is alert and oriented to person, place, and time. He has normal reflexes. No cranial nerve deficit. He exhibits normal muscle tone. Coordination normal.  Skin: Skin is warm and dry. No rash noted.  Psychiatric: His behavior is normal. Judgment and thought content normal.       Upset, depressed  Cane  Lab Results  Component Value Date   WBC 11.4* 11/01/2011   HGB 14.2 11/01/2011   HCT 39.0 11/01/2011   PLT 121* 11/01/2011   GLUCOSE 153* 11/01/2011   CHOL 182 01/18/2010   TRIG 258.0* 01/18/2010   HDL 31.00* 01/18/2010   LDLDIRECT 117.3 01/18/2010   LDLCALC 84 12/11/2006   ALT 33 02/11/2011   AST 30 02/11/2011   NA 141 11/01/2011   K 4.9 11/01/2011   CL 105 11/01/2011   CREATININE 1.00 11/01/2011   BUN 16 11/01/2011   CO2 28 11/01/2011   TSH 1.16 01/18/2010   PSA 1.41 10/30/2010   INR 1.1 04/27/2007   HGBA1C 5.7  02/11/2011    RADIOLOGY REPORT*  Clinical Data: Right hip pain  RIGHT HIP - COMPLETE 2+ VIEW  Comparison: None.  Findings: Frontal pelvis with AP and frog-leg lateral views of the  right hip show no evidence for an acute fracture. The patient is  status post lower lumbar fusion. There is near complete loss of  joint space in the upper right hip with associated subchondral  sclerosis and hypertrophic spurring. Similar but less advanced  changes are noted in the left  hip. No worrisome lytic or sclerotic  osseous abnormality.  IMPRESSION:  Right hip osteoarthritis.       Assessment & Plan:

## 2011-11-28 NOTE — Assessment & Plan Note (Signed)
Check CBGs prn

## 2011-11-28 NOTE — Telephone Encounter (Signed)
Left msg on triage stating on the patient assisting program with [employer] for his cilais. Company has fax over paper work to be completed, but they states haven't received back. Requesting the status of forms, or get a rx for 3 month supply and he will send to [employer]... 11/28/11@4 :21pm/LMB

## 2011-11-28 NOTE — Assessment & Plan Note (Signed)
We increased clonazepam to qid

## 2011-11-28 NOTE — Telephone Encounter (Signed)
I haven't seen it Thx

## 2011-12-01 ENCOUNTER — Telehealth: Payer: Self-pay | Admitting: Internal Medicine

## 2011-12-01 MED ORDER — TADALAFIL 20 MG PO TABS: 20.0000 mg | ORAL_TABLET | Freq: Every day | ORAL | Status: DC | PRN

## 2011-12-01 NOTE — Telephone Encounter (Signed)
Pt requesting cialis prescription---he will pick prescription up from this office per pt

## 2011-12-01 NOTE — Telephone Encounter (Signed)
Done. Thx.

## 2011-12-02 NOTE — Telephone Encounter (Signed)
Rx ready and please upfront for pt to pick up

## 2011-12-08 NOTE — Telephone Encounter (Signed)
I don't remember seeing forms either. [name] to check to see if she has forms.

## 2012-01-12 ENCOUNTER — Emergency Department (HOSPITAL_COMMUNITY): Payer: Self-pay

## 2012-01-12 ENCOUNTER — Emergency Department (HOSPITAL_COMMUNITY)
Admission: EM | Admit: 2012-01-12 | Discharge: 2012-01-12 | Disposition: A | Discharge status: Home / Self Care | Payer: Self-pay | Attending: Emergency Medicine | Admitting: Emergency Medicine

## 2012-01-12 ENCOUNTER — Encounter (HOSPITAL_COMMUNITY): Payer: Self-pay | Admitting: *Deleted

## 2012-01-12 DIAGNOSIS — M1610 Unilateral primary osteoarthritis, unspecified hip: Secondary | ICD-10-CM | POA: Insufficient documentation

## 2012-01-12 DIAGNOSIS — I10 Essential (primary) hypertension: Secondary | ICD-10-CM | POA: Insufficient documentation

## 2012-01-12 DIAGNOSIS — F17200 Nicotine dependence, unspecified, uncomplicated: Secondary | ICD-10-CM | POA: Insufficient documentation

## 2012-01-12 DIAGNOSIS — W19XXXA Unspecified fall, initial encounter: Secondary | ICD-10-CM

## 2012-01-12 DIAGNOSIS — F319 Bipolar disorder, unspecified: Secondary | ICD-10-CM | POA: Insufficient documentation

## 2012-01-12 DIAGNOSIS — F411 Generalized anxiety disorder: Secondary | ICD-10-CM | POA: Insufficient documentation

## 2012-01-12 DIAGNOSIS — M549 Dorsalgia, unspecified: Secondary | ICD-10-CM

## 2012-01-12 DIAGNOSIS — M545 Low back pain, unspecified: Secondary | ICD-10-CM | POA: Insufficient documentation

## 2012-01-12 DIAGNOSIS — F988 Other specified behavioral and emotional disorders with onset usually occurring in childhood and adolescence: Secondary | ICD-10-CM | POA: Insufficient documentation

## 2012-01-12 DIAGNOSIS — Z808 Family history of malignant neoplasm of other organs or systems: Secondary | ICD-10-CM | POA: Insufficient documentation

## 2012-01-12 DIAGNOSIS — Z833 Family history of diabetes mellitus: Secondary | ICD-10-CM | POA: Insufficient documentation

## 2012-01-12 NOTE — ED Notes (Signed)
Lab notified of need for workers comp drug screen

## 2012-01-12 NOTE — ED Notes (Signed)
To ED for eval of lower back pain since falling from ladder while at work. Pt states he fell 2 ft. Ambulatory.

## 2012-01-12 NOTE — ED Provider Notes (Signed)
History  This chart was scribed for [name] B. Karle Starch, MD by Roe Coombs. The patient was seen in room TR10C/TR10C. Patient's care was started at 1107.     CSN: [id number]  Arrival date & time 01/12/12  [id number]   First MD Initiated Contact with Patient 01/12/12 1107      Chief Complaint  Patient presents with  . Back Pain    The history is provided by the patient. No language interpreter was used.    Gray Jonathan is a 46 y.o. male who presents to the Emergency Department complaining of moderate, constant, non-radiating mid and lower back pain resulting from a fall that occurred 1-2 hours ago. Patient states that he fell two feet from a ladder at work. Patient is ambulatory. Patient denies any other symptoms or pain at this time. He has a surgical history of lumbar spinal fusion (2003).  Past Medical History  Diagnosis Date  . Anxiety   . Hypertension   . Depression     bipolar [facility]  . Hypogonadism male   . ADD (attention deficit disorder)   . Microscopic hematuria     hereditary s/p Urology eval  . Bipolar 1 disorder   . Arthritis     right hip    Past Surgical History  Procedure Date  . Lumbar disc surgery   . Back surgery     Family History  Problem Relation Age of Onset  . Diabetes Father   . Cancer Mother     died of melanoma with mets  . Cervical cancer Sister   . Diabetes Sister     History  Substance Use Topics  . Smoking status: Current Every Day Smoker    Types: Cigarettes  . Smokeless tobacco: Not on file  . Alcohol Use: No      Review of Systems A complete 10 system review of systems was obtained and all systems are negative except as noted in the HPI and PMH. .  Allergies  Review of patient's allergies indicates no known allergies.  Home Medications   Current Outpatient Rx  Name Route Sig Dispense Refill  . BUPROPION HCL ER (SR) 150 MG PO TB12 Oral Take 150 mg by mouth 2 (two) times daily.     Marland Kitchen VITAMIN D 1000 UNITS PO TABS  Oral Take 1,000 Units by mouth daily.      Marland Kitchen CLONAZEPAM 2 MG PO TABS Oral Take 1 tablet (2 mg total) by mouth 4 (four) times daily as needed. For anxiety 120 tablet 2  . CYANOCOBALAMIN 500 MCG PO TABS Oral Take 500 mcg by mouth daily.     Marland Kitchen DIVALPROEX SODIUM ER 250 MG PO TB24 Oral Take 250 mg by mouth at bedtime.    Marland Kitchen COLACE PO Oral Take 3 tablets by mouth at bedtime.     Marland Kitchen GLUCOSAMINE MSM COMPLEX PO Oral Take 1 capsule by mouth 2 (two) times daily.    . IBUPROFEN 200 MG PO TABS Oral Take 600 mg by mouth 2 (two) times daily.    Marland Kitchen LISDEXAMFETAMINE DIMESYLATE 20 MG PO CAPS Oral Take 20 mg by mouth every morning.    Marland Kitchen ONE-DAILY MULTI VITAMINS PO TABS Oral Take 1 tablet by mouth daily.      . QUETIAPINE FUMARATE ER 300 MG PO TB24 Oral Take 300 mg by mouth at bedtime.    . TESTOSTERONE 20.25 MG/1.25GM (1.62%) TD GEL Transdermal Place 3 application onto the skin daily.    . TRAMADOL HCL  50 MG PO TABS  TAKE 1 TO 2 TABLETS BY MOUTH TWICE A DAY AS NEEDED FOR PAIN 100 tablet 3    BP 151/94  Pulse 85  Temp 97.9 F (36.6 C) (Oral)  Resp 16  SpO2 96%  Physical Exam  Nursing note and vitals reviewed. Constitutional: He is oriented to person, place, and time. He appears well-developed and well-nourished.  HENT:  Head: Normocephalic and atraumatic.  Eyes: EOM are normal. Pupils are equal, round, and reactive to light.  Neck: Normal range of motion. Neck supple.  Cardiovascular: Normal rate, normal heart sounds and intact distal pulses.   Pulmonary/Chest: Effort normal and breath sounds normal.  Abdominal: Bowel sounds are normal. He exhibits no distension. There is no tenderness.  Musculoskeletal: Normal range of motion. He exhibits no edema and no tenderness.       Thoracic back: He exhibits tenderness.       Lumbar back: He exhibits tenderness.       Tenderness to midline thoracic and lumbar spine.  Neurological: He is alert and oriented to person, place, and time. He has normal strength. No  cranial nerve deficit or sensory deficit.  Skin: Skin is warm and dry. No rash noted.  Psychiatric: He has a normal mood and affect.    ED Course  Procedures (including critical care time) DIAGNOSTIC STUDIES: Oxygen Saturation is 96% on room air, adequate by my interpretation.    COORDINATION OF CARE: 11:12am- Patient informed of current plan for treatment and evaluation and agrees with plan at this time.   Labs Reviewed - No data to display  Dg Thoracic Spine 2 View  01/12/2012  *RADIOLOGY REPORT*  Clinical Data: Fall, mid to low back pain.  THORACIC SPINE - 2 VIEW  Comparison: Chest x-ray 04/29/2007.  Findings: Mild anterior degenerative spurring in the mid and lower thoracic spine.  Normal alignment.  No fracture.  IMPRESSION: No acute findings.   Original Report Authenticated By: Raelyn Number, M.D.    Dg Lumbar Spine Complete  01/12/2012  *RADIOLOGY REPORT*  Clinical Data: Fall, mid to lower back pain.  LUMBAR SPINE - COMPLETE 4+ VIEW  Comparison: None  Findings: Prior posterior fusion at L4-5.  Normal alignment.  No fracture.  Degenerative disc disease changes at L5-S1 with disc space narrowing.  Anterior spurring in the lower thoracic and upper lumbar spine.  SI joints are symmetric and unremarkable.  IMPRESSION: No acute bony abnormality.   Original Report Authenticated By: Raelyn Number, M.D.      1. Fall   2. Back pain       MDM  Xrays neg. Pt advised to continue pain medications as needed from PCP. Followup there as needed.      I personally performed the services described in the documentation, which were scribed in my presence. The recorded information has been reviewed and considered.     Deina Lipsey B. Karle Starch, MD 01/12/12 1234

## 2012-02-27 ENCOUNTER — Ambulatory Visit: Payer: Self-pay | Admitting: Internal Medicine

## 2012-03-08 ENCOUNTER — Telehealth: Payer: Self-pay | Admitting: *Deleted

## 2012-03-08 MED ORDER — CLONAZEPAM 2 MG PO TABS: 2.0000 mg | ORAL_TABLET | Freq: Four times a day (QID) | ORAL | Status: DC | PRN

## 2012-03-08 NOTE — Telephone Encounter (Signed)
Done

## 2012-03-08 NOTE — Telephone Encounter (Signed)
OK to fill this prescription with additional refills x3 Thank you!  

## 2012-03-08 NOTE — Telephone Encounter (Signed)
Rf req for clonazepam 2 mg 1 po qid prn. # 120. Last filled 02/05/12. Ok to Rf?

## 2012-03-09 ENCOUNTER — Telehealth: Payer: Self-pay | Admitting: Internal Medicine

## 2012-03-09 NOTE — Telephone Encounter (Signed)
The patient is in need of a written rx for his patient assistance for the androgel.  Thanks!

## 2012-03-10 MED ORDER — TESTOSTERONE 20.25 MG/1.25GM (1.62%) TD GEL: 3.0000 "application " | Freq: Every day | TRANSDERMAL | Status: DC

## 2012-03-10 NOTE — Telephone Encounter (Signed)
Done. Left detailed mess informing pt of below.

## 2012-03-15 ENCOUNTER — Telehealth: Payer: Self-pay | Admitting: *Deleted

## 2012-03-15 MED ORDER — TADALAFIL 5 MG PO TABS: 5.0000 mg | ORAL_TABLET | Freq: Every day | ORAL | Status: DC

## 2012-03-15 NOTE — Telephone Encounter (Signed)
Pt's Cialis 5 mg arrived today from [place] pt assistance. I left detailed mess informing pt he can pick them up at our front desk.

## 2012-04-02 ENCOUNTER — Other Ambulatory Visit: Payer: Self-pay | Admitting: *Deleted

## 2012-04-02 MED ORDER — TESTOSTERONE 20.25 MG/1.25GM (1.62%) TD GEL: 3.0000 "application " | Freq: Every day | TRANSDERMAL | Status: DC

## 2012-04-22 ENCOUNTER — Telehealth: Payer: Self-pay | Admitting: *Deleted

## 2012-04-22 NOTE — Telephone Encounter (Signed)
Rf req for Hydroco/APAP 7.5/325mg  1 po qid prn pain. # 120. Last filled 02/06/12. Ok to Rf?

## 2012-04-22 NOTE — Telephone Encounter (Signed)
OK to fill this prescription with additional refills x0 Sch OV Thank you!

## 2012-04-23 MED ORDER — HYDROCODONE-ACETAMINOPHEN 7.5-325 MG PO TABS: 1.0000 | ORAL_TABLET | Freq: Four times a day (QID) | ORAL | Status: DC | PRN

## 2012-04-23 NOTE — Telephone Encounter (Signed)
Done

## 2012-06-01 ENCOUNTER — Telehealth: Payer: Self-pay | Admitting: *Deleted

## 2012-06-01 NOTE — Telephone Encounter (Signed)
Rf req for Hydroco/APAP 7.5/325 1 po qid. # 120. Last filled 04/23/2012. Ok to Rf?

## 2012-06-01 NOTE — Telephone Encounter (Signed)
OK to fill this prescription with additional refills x0 Needs OV Thank you!

## 2012-06-02 MED ORDER — HYDROCODONE-ACETAMINOPHEN 7.5-325 MG PO TABS: 1.0000 | ORAL_TABLET | Freq: Four times a day (QID) | ORAL | Status: DC | PRN

## 2012-06-02 NOTE — Telephone Encounter (Signed)
Done. Will call pt to sched OV.

## 2012-06-10 ENCOUNTER — Encounter: Payer: Self-pay | Admitting: Internal Medicine

## 2012-06-10 ENCOUNTER — Ambulatory Visit (INDEPENDENT_AMBULATORY_CARE_PROVIDER_SITE_OTHER): Payer: Self-pay | Admitting: Internal Medicine

## 2012-06-10 VITALS — BP 130/90 | HR 84 | Temp 97.7°F | Resp 16 | Wt 214.0 lb

## 2012-06-10 DIAGNOSIS — Z23 Encounter for immunization: Secondary | ICD-10-CM

## 2012-06-10 DIAGNOSIS — E119 Type 2 diabetes mellitus without complications: Secondary | ICD-10-CM

## 2012-06-10 DIAGNOSIS — I10 Essential (primary) hypertension: Secondary | ICD-10-CM

## 2012-06-10 DIAGNOSIS — F411 Generalized anxiety disorder: Secondary | ICD-10-CM

## 2012-06-10 DIAGNOSIS — F329 Major depressive disorder, single episode, unspecified: Secondary | ICD-10-CM

## 2012-06-10 DIAGNOSIS — M25559 Pain in unspecified hip: Secondary | ICD-10-CM

## 2012-06-10 DIAGNOSIS — M25551 Pain in right hip: Secondary | ICD-10-CM

## 2012-06-10 MED ORDER — HYDROCODONE-ACETAMINOPHEN 10-325 MG PO TABS: 1.0000 | ORAL_TABLET | Freq: Three times a day (TID) | ORAL | Status: DC | PRN

## 2012-06-10 NOTE — Assessment & Plan Note (Signed)
Continue with current prescription therapy as reflected on the Med list.  

## 2012-06-10 NOTE — Assessment & Plan Note (Signed)
Bipolar depression, chronic 

## 2012-06-10 NOTE — Assessment & Plan Note (Signed)
Continue with current prescription therapy as reflected on the Med list. BP Readings from Last 3 Encounters:  06/10/12 130/90  01/12/12 151/94  11/28/11 138/80

## 2012-06-10 NOTE — Progress Notes (Signed)
Subjective:    Hip Pain  The pain is present in the right thigh and right leg. The pain is at a severity of 8/10. The pain is severe. The pain has been constant since onset. The treatment provided moderate relief.    The patient presents for a follow-up of hypogonadism, chronic hypertension, chronic dyslipidemia, type 2 diabetes controlled with medicines. C/o ED  C/o R groin and R thigh pain  -  xrays -- severe OA C/o being depressed. Seeing a therapist... C/o severe pain in R hip worse with standing It is very hard for him to  Work due to hip pain  Wt Readings from Last 3 Encounters:  06/10/12 214 lb (97.07 kg)  11/28/11 211 lb (95.709 kg)  07/22/11 225 lb (102.059 kg)    BP Readings from Last 3 Encounters:  06/10/12 130/90  01/12/12 151/94  11/28/11 138/80    Review of Systems  Constitutional: Negative for appetite change, fatigue and unexpected weight change.  HENT: Negative for nosebleeds, congestion, sore throat, sneezing, trouble swallowing and neck pain.   Eyes: Negative for itching and visual disturbance.  Respiratory: Negative for cough.   Cardiovascular: Negative for chest pain, palpitations and leg swelling.  Gastrointestinal: Negative for nausea, diarrhea, blood in stool and abdominal distention.  Genitourinary: Negative for frequency and hematuria.  Musculoskeletal: Positive for back pain. Negative for joint swelling and gait problem.  Skin: Negative for rash.  Neurological: Negative for dizziness, tremors, speech difficulty and weakness.  Psychiatric/Behavioral: Positive for behavioral problems, sleep disturbance and dysphoric mood. Negative for suicidal ideas and agitation. The patient is nervous/anxious.        Objective:   Physical Exam  Constitutional: He is oriented to person, place, and time. He appears well-developed. No distress.  HENT:  Mouth/Throat: Oropharynx is clear and moist.  Eyes: Conjunctivae are normal. Pupils are equal, round, and  reactive to light.  Neck: Normal range of motion. No JVD present. No thyromegaly present.  Cardiovascular: Normal rate, regular rhythm, normal heart sounds and intact distal pulses.  Exam reveals no gallop and no friction rub.   No murmur heard. Pulmonary/Chest: Effort normal and breath sounds normal. No respiratory distress. He has no wheezes. He has no rales. He exhibits no tenderness.  Abdominal: Soft. Bowel sounds are normal. He exhibits no distension and no mass. There is no tenderness. There is no rebound and no guarding.  Musculoskeletal: Normal range of motion. He exhibits tenderness (B hips). He exhibits no edema.  B hips are tender w/ROM and stiff w/restricted ROM  Lymphadenopathy:    He has no cervical adenopathy.  Neurological: He is alert and oriented to person, place, and time. He has normal reflexes. No cranial nerve deficit. He exhibits normal muscle tone. Coordination normal.  Skin: Skin is warm and dry. No rash noted.  Psychiatric: His behavior is normal. Judgment and thought content normal.  Upset, depressed  Cane  Lab Results  Component Value Date   WBC 11.4* 11/01/2011   HGB 14.2 11/01/2011   HCT 39.0 11/01/2011   PLT 121* 11/01/2011   GLUCOSE 153* 11/01/2011   CHOL 182 01/18/2010   TRIG 258.0* 01/18/2010   HDL 31.00* 01/18/2010   LDLDIRECT 117.3 01/18/2010   LDLCALC 84 12/11/2006   ALT 33 02/11/2011   AST 30 02/11/2011   NA 141 11/01/2011   K 4.9 11/01/2011   CL 105 11/01/2011   CREATININE 1.00 11/01/2011   BUN 16 11/01/2011   CO2 28 11/01/2011   TSH  1.16 01/18/2010   PSA 1.41 10/30/2010   INR 1.1 04/27/2007   HGBA1C 5.7 02/11/2011    RADIOLOGY REPORT*  Clinical Data: Right hip pain  RIGHT HIP - COMPLETE 2+ VIEW  Comparison: None.  Findings: Frontal pelvis with AP and frog-leg lateral views of the  right hip show no evidence for an acute fracture. The patient is  status post lower lumbar fusion. There is near complete loss of  joint space in the upper right hip with  associated subchondral  sclerosis and hypertrophic spurring. Similar but less advanced  changes are noted in the left hip. No worrisome lytic or sclerotic  osseous abnormality.  IMPRESSION:  Right hip osteoarthritis.       Assessment & Plan:

## 2012-06-10 NOTE — Assessment & Plan Note (Signed)
Wt Readings from Last 3 Encounters:  06/10/12 214 lb (97.07 kg)  11/28/11 211 lb (95.709 kg)  07/22/11 225 lb (102.059 kg)  CBG 94-110

## 2012-06-10 NOTE — Assessment & Plan Note (Signed)
2/14 worse: will increase Hydrocodone/APAP to 10/325

## 2012-07-05 ENCOUNTER — Telehealth: Payer: Self-pay | Admitting: *Deleted

## 2012-07-05 NOTE — Telephone Encounter (Signed)
Rf req for Clonazepam 2 mg 1 po qid prn. # 120. Last filled 06/04/12.ok to Rf?

## 2012-07-06 MED ORDER — CLONAZEPAM 2 MG PO TABS: 2.0000 mg | ORAL_TABLET | Freq: Four times a day (QID) | ORAL | Status: DC | PRN

## 2012-07-06 NOTE — Telephone Encounter (Signed)
Done

## 2012-07-06 NOTE — Telephone Encounter (Signed)
OK to fill this prescription with additional refills x0 Thank you!  

## 2012-07-30 ENCOUNTER — Encounter (HOSPITAL_COMMUNITY): Payer: Self-pay | Admitting: Nurse Practitioner

## 2012-07-30 ENCOUNTER — Emergency Department (HOSPITAL_COMMUNITY): Payer: Self-pay

## 2012-07-30 ENCOUNTER — Emergency Department (HOSPITAL_COMMUNITY)
Admission: EM | Admit: 2012-07-30 | Discharge: 2012-07-30 | Disposition: A | Discharge status: Home / Self Care | Payer: Self-pay | Attending: Emergency Medicine | Admitting: Emergency Medicine

## 2012-07-30 DIAGNOSIS — Z862 Personal history of diseases of the blood and blood-forming organs and certain disorders involving the immune mechanism: Secondary | ICD-10-CM | POA: Insufficient documentation

## 2012-07-30 DIAGNOSIS — Z79899 Other long term (current) drug therapy: Secondary | ICD-10-CM | POA: Insufficient documentation

## 2012-07-30 DIAGNOSIS — G8929 Other chronic pain: Secondary | ICD-10-CM | POA: Insufficient documentation

## 2012-07-30 DIAGNOSIS — F319 Bipolar disorder, unspecified: Secondary | ICD-10-CM | POA: Insufficient documentation

## 2012-07-30 DIAGNOSIS — I10 Essential (primary) hypertension: Secondary | ICD-10-CM | POA: Insufficient documentation

## 2012-07-30 DIAGNOSIS — Z87442 Personal history of urinary calculi: Secondary | ICD-10-CM | POA: Insufficient documentation

## 2012-07-30 DIAGNOSIS — M545 Low back pain, unspecified: Secondary | ICD-10-CM | POA: Insufficient documentation

## 2012-07-30 DIAGNOSIS — M1610 Unilateral primary osteoarthritis, unspecified hip: Secondary | ICD-10-CM | POA: Insufficient documentation

## 2012-07-30 DIAGNOSIS — R319 Hematuria, unspecified: Secondary | ICD-10-CM | POA: Insufficient documentation

## 2012-07-30 DIAGNOSIS — Z8639 Personal history of other endocrine, nutritional and metabolic disease: Secondary | ICD-10-CM | POA: Insufficient documentation

## 2012-07-30 DIAGNOSIS — F988 Other specified behavioral and emotional disorders with onset usually occurring in childhood and adolescence: Secondary | ICD-10-CM | POA: Insufficient documentation

## 2012-07-30 DIAGNOSIS — F411 Generalized anxiety disorder: Secondary | ICD-10-CM | POA: Insufficient documentation

## 2012-07-30 DIAGNOSIS — R209 Unspecified disturbances of skin sensation: Secondary | ICD-10-CM | POA: Insufficient documentation

## 2012-07-30 DIAGNOSIS — F17200 Nicotine dependence, unspecified, uncomplicated: Secondary | ICD-10-CM | POA: Insufficient documentation

## 2012-07-30 LAB — COMPREHENSIVE METABOLIC PANEL
ALT: 20 U/L (ref 0–53)
Alkaline Phosphatase: 66 U/L (ref 39–117)
BUN: 22 mg/dL (ref 6–23)
CO2: 21 mEq/L (ref 19–32)
Chloride: 106 mEq/L (ref 96–112)
GFR calc Af Amer: >90 mL/min (ref >90)
GFR calc non Af Amer: >90 mL/min (ref >90)
Glucose, Bld: 85 mg/dL (ref 70–99)
Potassium: 5 mEq/L (ref 3.5–5.1)
Sodium: 141 mEq/L (ref 135–145)
Total Bilirubin: 0.3 mg/dL (ref 0.3–1.2)
Total Protein: 6.4 g/dL (ref 6.0–8.3)

## 2012-07-30 LAB — POCT I-STAT, CHEM 8
BUN: 23 mg/dL (ref 6–23)
Calcium, Ion: 1.14 mmol/L (ref 1.12–1.23)
Hemoglobin: 12.2 g/dL — ABNORMAL LOW (ref 13.0–17.0)
TCO2: 25 mmol/L (ref 0–100)

## 2012-07-30 LAB — URINE MICROSCOPIC-ADD ON

## 2012-07-30 LAB — CBC WITH DIFFERENTIAL/PLATELET
Hemoglobin: 12.8 g/dL — ABNORMAL LOW (ref 13.0–17.0)
Lymphocytes Relative: 34 % (ref 12–46)
Lymphs Abs: 1.8 10*3/uL (ref 0.7–4.0)
Monocytes Relative: 10 % (ref 3–12)
Neutro Abs: 2.9 10*3/uL (ref 1.7–7.7)
Neutrophils Relative %: 54 % (ref 43–77)
Platelets: 125 10*3/uL — ABNORMAL LOW (ref 150–400)
RBC: 4.04 MIL/uL — ABNORMAL LOW (ref 4.22–5.81)
WBC: 5.4 10*3/uL (ref 4.0–10.5)

## 2012-07-30 LAB — URINALYSIS, ROUTINE W REFLEX MICROSCOPIC
Bilirubin Urine: NEGATIVE
Nitrite: NEGATIVE
Specific Gravity, Urine: 1.019 (ref 1.005–1.030)
Urobilinogen, UA: 0.2 mg/dL (ref 0.0–1.0)
pH: 7 (ref 5.0–8.0)

## 2012-07-30 MED ORDER — HYDROMORPHONE HCL PF 1 MG/ML IJ SOLN
1.0000 mg | Freq: Once | INTRAMUSCULAR | Status: AC
  Administered 2012-07-30: 1 mg via INTRAVENOUS
  Filled 2012-07-30: qty 1

## 2012-07-30 MED ORDER — OXYCODONE HCL 5 MG PO TABS: 5.0000 mg | ORAL_TABLET | ORAL | Status: DC | PRN

## 2012-07-30 MED ORDER — HYDROMORPHONE HCL PF 2 MG/ML IJ SOLN
2.0000 mg | Freq: Once | INTRAMUSCULAR | Status: AC
  Administered 2012-07-30: 2 mg via INTRAVENOUS
  Filled 2012-07-30: qty 1

## 2012-07-30 NOTE — ED Notes (Signed)
Pt unable to provided a urine sample at this time. Urinal at bedside.

## 2012-07-30 NOTE — ED Provider Notes (Signed)
History     CSN: [id number]  Arrival date & time 07/30/12  1044   First MD Initiated Contact with Patient 07/30/12 1048      No chief complaint on file.   (Consider location/radiation/quality/duration/timing/severity/associated sxs/prior treatment) HPI Comments: Pt presents to the ED for increasing LS pain over the past 2 weeks without recent injury or trauma.  Pain is described as constant, sharp, and radiating down the back of both legs.  Also notes intermittent numbness and paresthesias of pelvis and BLE causing an unsteady gait.  Denies any actual falls.  Notes some post-void dribbling after urination but no complete loss of bowel or bladder function.  Hx of cauda equina syndrome in 2001 secondary to large disc herniation at L4-L5.  Subsequent laminectomy 2001 and revision with discectomy 2004.   Also notes some increased R CVA pain.  Hx of kidney stones, recently passed stone 2 weeks ago.  Has mild hematuria at baseline.  Denies any chest pain, SOB, abdominal pain, nausea, vomiting, diarrhea, dysuria, fever, sweats, or chills.  The history is provided by the patient.    Past Medical History  Diagnosis Date  . Anxiety   . Hypertension   . Depression     bipolar [facility]  . Hypogonadism male   . ADD (attention deficit disorder)   . Microscopic hematuria     hereditary s/p Urology eval  . Bipolar 1 disorder   . Arthritis     right hip    Past Surgical History  Procedure Laterality Date  . Lumbar disc surgery    . Back surgery      Family History  Problem Relation Age of Onset  . Diabetes Father   . Cancer Mother     died of melanoma with mets  . Cervical cancer Sister   . Diabetes Sister     History  Substance Use Topics  . Smoking status: Current Every Day Smoker    Types: Cigarettes  . Smokeless tobacco: Not on file  . Alcohol Use: No      Review of Systems  Musculoskeletal: Positive for back pain.  All other systems reviewed and are  negative.    Allergies  Review of patient's allergies indicates no known allergies.  Home Medications   Current Outpatient Rx  Name  Route  Sig  Dispense  Refill  . buPROPion (WELLBUTRIN SR) 150 MG 12 hr tablet   Oral   Take 150 mg by mouth 2 (two) times daily.          . celecoxib (CELEBREX) 200 MG capsule   Oral   Take 200 mg by mouth 2 (two) times daily.         . cholecalciferol (VITAMIN D) 1000 UNITS tablet   Oral   Take 1,000 Units by mouth daily.           . clonazePAM (KLONOPIN) 2 MG tablet   Oral   Take 1 tablet (2 mg total) by mouth 4 (four) times daily as needed. For anxiety   120 tablet   0   . cyanocobalamin 500 MCG tablet   Oral   Take 500 mcg by mouth daily.          . divalproex (DEPAKOTE ER) 250 MG 24 hr tablet   Oral   Take 250 mg by mouth at bedtime.         Donnie Aho (GLUCOSAMINE MSM COMPLEX PO)   Oral   Take 1 capsule by mouth 2 (two)  times daily.         Marland Kitchen HYDROcodone-acetaminophen (NORCO) 10-325 MG per tablet   Oral   Take 1 tablet by mouth every 8 (eight) hours as needed for pain.   120 tablet   2   . ibuprofen (ADVIL,MOTRIN) 200 MG tablet   Oral   Take 600 mg by mouth 2 (two) times daily.         Marland Kitchen lisdexamfetamine (VYVANSE) 20 MG capsule   Oral   Take 20 mg by mouth every morning.         . Multiple Vitamin (MULTIVITAMIN) tablet   Oral   Take 1 tablet by mouth daily.           . QUEtiapine (SEROQUEL XR) 200 MG 24 hr tablet   Oral   Take 200 mg by mouth at bedtime.         . S-Adenosylmethionine (SAM-E PO)   Oral   Take 1 tablet by mouth daily.         . Testosterone (ANDROGEL) 20.25 MG/1.25GM (1.62%) GEL   Transdermal   Place 3 application onto the skin daily.   215 g   3     There were no vitals taken for this visit.  Physical Exam  Nursing note and vitals reviewed. Constitutional: He is oriented to person, place, and time. He appears well-developed and well-nourished.   HENT:  Head: Normocephalic and atraumatic.  Mouth/Throat: Oropharynx is clear and moist.  Eyes: Conjunctivae and EOM are normal. Pupils are equal, round, and reactive to light.  Neck: Normal range of motion.  Cardiovascular: Normal rate, regular rhythm and normal heart sounds.   Pulmonary/Chest: Effort normal and breath sounds normal.  Abdominal: Soft. Bowel sounds are normal. There is CVA tenderness (right).  Musculoskeletal:       Lumbar back: He exhibits decreased range of motion (due to pain), tenderness, bony tenderness and pain. He exhibits no swelling, no deformity, no spasm and normal pulse.  Full ROM of BLE, sensation intact, strong distal pulses and cap refill  Neurological: He is alert and oriented to person, place, and time. He has normal strength. No cranial nerve deficit or sensory deficit. Coordination and gait normal.  Skin: Skin is warm and dry.  Psychiatric: He has a normal mood and affect.    ED Course  Procedures (including critical care time)  Labs Reviewed  URINALYSIS, ROUTINE W REFLEX MICROSCOPIC - Abnormal; Notable for the following:    Hgb urine dipstick LARGE (*)    All other components within normal limits  CBC WITH DIFFERENTIAL - Abnormal; Notable for the following:    RBC 4.04 (*)    Hemoglobin 12.8 (*)    HCT 35.1 (*)    MCHC 36.5 (*)    Platelets 125 (*)    All other components within normal limits  COMPREHENSIVE METABOLIC PANEL - Abnormal; Notable for the following:    AST 44 (*)    All other components within normal limits  POCT I-STAT, CHEM 8 - Abnormal; Notable for the following:    Hemoglobin 12.2 (*)    HCT 36.0 (*)    All other components within normal limits  URINE MICROSCOPIC-ADD ON   Mr Lumbar Spine Wo Contrast  07/30/2012  *RADIOLOGY REPORT*  Clinical Data: Bilateral leg pain and weakness.  Numbness in the right foot.  Previous fusion  MRI LUMBAR SPINE WITHOUT CONTRAST  Technique:  Multiplanar and multiecho pulse sequences of the  lumbar spine were obtained without intravenous  contrast.  Comparison: Radiography 01/12/2012  Findings: T11-12 and T12-L1:  Normal.  Conus tip at lower T12.  L1-2:  Mild bulging of the disc.  No significant stenosis.  L2-3:  Mild bulging of the disc.  No significant stenosis.  L3-4:  Mild bulging of the disc.  Mild facet and ligamentous hypertrophy.  Mild canal narrowing without apparent neural compression.  L4-5:  Previous discectomy and fusion.  Fusion appears solid. Sufficient patency of the canal and foramina.  L5-S1:  Some transitional features.  Mild facet degeneration.  No canal or foraminal stenosis.  IMPRESSION: Satisfactory appearance of the fusion level of L4-5.  Disc bulges at L2-3 and L3-4.  Mild narrowing of the lateral recesses at those levels without definite neural compression.   Original Report Authenticated By: Nelson Chimes, M.D.      1. Chronic low back pain       MDM   Pt presenting to the ED for increased LS pain x 7 days without recent trauma or injury.  Intermittent numbness and paresthesias of pelvis and BLE causing unsteady gait without actual fall.  Hx of cauda equina syndrome 2001.  Prior laminectomy and discectomy.  MRI LS as above- mild narrowing L2-L3, L3-L4 without definitive neural compression.  VS stable, pain controlled, pt in NAD with full sensation of pelvic and LE.  Pt will be d/c and FU with neurology- Dr. Doy Mince.  Rx oxycodone.  Discussed with Dr. Lita Mains who agrees with plan.  Return precautions advised.     Larene Pickett, PA-C 07/31/12 [place], PA-C 07/31/12 [phone]
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
## 2012-07-31 NOTE — ED Provider Notes (Signed)
Medical screening examination/treatment/procedure(s) were conducted as a shared visit with non-physician practitioner(s) and myself.  I personally evaluated the patient during the encounter   Julianne Rice, MD 07/31/12 1329

## 2012-08-02 ENCOUNTER — Telehealth: Payer: Self-pay | Admitting: *Deleted

## 2012-08-02 NOTE — Telephone Encounter (Signed)
Rf req For Clonazepam 2 mg 1 po qid. #120. Last filled 07/06/12. Ok to Rf?

## 2012-08-03 MED ORDER — CLONAZEPAM 2 MG PO TABS: 2.0000 mg | ORAL_TABLET | Freq: Four times a day (QID) | ORAL | Status: DC

## 2012-08-03 NOTE — Telephone Encounter (Signed)
Done

## 2012-08-03 NOTE — Telephone Encounter (Signed)
OK to fill this prescription with additional refills x0 Thank you!  

## 2012-08-09 ENCOUNTER — Ambulatory Visit (INDEPENDENT_AMBULATORY_CARE_PROVIDER_SITE_OTHER): Payer: Self-pay | Admitting: Internal Medicine

## 2012-08-09 ENCOUNTER — Encounter: Payer: Self-pay | Admitting: Internal Medicine

## 2012-08-09 VITALS — BP 148/90 | HR 76 | Temp 97.5°F | Resp 16 | Wt 224.0 lb

## 2012-08-09 DIAGNOSIS — M25559 Pain in unspecified hip: Secondary | ICD-10-CM

## 2012-08-09 DIAGNOSIS — M545 Low back pain, unspecified: Secondary | ICD-10-CM

## 2012-08-09 DIAGNOSIS — M169 Osteoarthritis of hip, unspecified: Secondary | ICD-10-CM

## 2012-08-09 DIAGNOSIS — M1611 Unilateral primary osteoarthritis, right hip: Secondary | ICD-10-CM

## 2012-08-09 DIAGNOSIS — F411 Generalized anxiety disorder: Secondary | ICD-10-CM

## 2012-08-09 DIAGNOSIS — M25551 Pain in right hip: Secondary | ICD-10-CM

## 2012-08-09 MED ORDER — CLONAZEPAM 2 MG PO TABS: 2.0000 mg | ORAL_TABLET | Freq: Four times a day (QID) | ORAL | Status: DC

## 2012-08-09 MED ORDER — TADALAFIL 5 MG PO TABS: 5.0000 mg | ORAL_TABLET | Freq: Every day | ORAL | Status: DC | PRN

## 2012-08-09 MED ORDER — OXYCODONE HCL 10 MG PO TABS: 10.0000 mg | ORAL_TABLET | Freq: Four times a day (QID) | ORAL | Status: DC | PRN

## 2012-08-09 NOTE — Assessment & Plan Note (Signed)
Continue with current prescription therapy as reflected on the Med list.  

## 2012-08-09 NOTE — Assessment & Plan Note (Addendum)
Chronic - s/p L4-5 fusion Dr Vertell Limber  Potential benefits of a long term opioids use as well as potential risks (i.e. addiction risk, apnea etc) and complications (i.e. Somnolence, constipation and others) were explained to the patient and were aknowledged.  07/30/12 LS MRI: IMPRESSION:  Satisfactory appearance of the fusion level of L4-5.  Disc bulges at L2-3 and L3-4. Mild narrowing of the lateral  recesses at those levels without definite neural compression.  Original Report Authenticated By: Nelson Chimes, M.D.  NS f/u consult

## 2012-08-09 NOTE — Progress Notes (Signed)
Subjective:    Hip Pain  The pain is present in the right thigh and right leg. The pain is at a severity of 9/10. The pain is severe. The pain has been constant since onset. The treatment provided moderate relief.  Back Pain This is a chronic problem. The current episode started more than 1 year ago. The pain is present in the lumbar spine and gluteal. The quality of the pain is described as aching and burning. The pain is at a severity of 9/10. The pain is severe. The symptoms are aggravated by bending, position, standing and twisting. Stiffness is present all day. Pertinent negatives include no chest pain or weakness. He has tried analgesics and muscle relaxant for the symptoms. The treatment provided mild relief.   Norco is not helping He took pain meds, clonazepam last on 4/20  The patient presents for a follow-up of hypogonadism, chronic hypertension, chronic dyslipidemia, type 2 diabetes controlled with medicines. C/o ED  C/o R groin and R thigh pain  -  xrays -- severe OA C/o being depressed. Seeing a therapist... C/o severe pain in R hip worse with standing It is very hard for him to  Work due to hip pain  Wt Readings from Last 3 Encounters:  08/09/12 224 lb (101.606 kg)  07/30/12 214 lb (97.07 kg)  06/10/12 214 lb (97.07 kg)    BP Readings from Last 3 Encounters:  08/09/12 148/90  07/30/12 111/60  06/10/12 130/90    Review of Systems  Constitutional: Negative for appetite change, fatigue and unexpected weight change.  HENT: Negative for nosebleeds, congestion, sore throat, sneezing, trouble swallowing and neck pain.   Eyes: Negative for itching and visual disturbance.  Respiratory: Negative for cough.   Cardiovascular: Negative for chest pain, palpitations and leg swelling.  Gastrointestinal: Negative for nausea, diarrhea, blood in stool and abdominal distention.  Genitourinary: Negative for frequency and hematuria.  Musculoskeletal: Positive for back pain,  arthralgias and gait problem. Negative for joint swelling.  Skin: Negative for rash.  Neurological: Negative for dizziness, tremors, speech difficulty and weakness.  Psychiatric/Behavioral: Positive for behavioral problems, sleep disturbance and dysphoric mood. Negative for suicidal ideas and agitation. The patient is nervous/anxious.        Objective:   Physical Exam  Constitutional: He is oriented to person, place, and time. He appears well-developed. No distress.  HENT:  Mouth/Throat: Oropharynx is clear and moist.  Eyes: Conjunctivae are normal. Pupils are equal, round, and reactive to light.  Neck: Normal range of motion. No JVD present. No thyromegaly present.  Cardiovascular: Normal rate, regular rhythm, normal heart sounds and intact distal pulses.  Exam reveals no gallop and no friction rub.   No murmur heard. Pulmonary/Chest: Effort normal and breath sounds normal. No respiratory distress. He has no wheezes. He has no rales. He exhibits no tenderness.  Abdominal: Soft. Bowel sounds are normal. He exhibits no distension and no mass. There is no tenderness. There is no rebound and no guarding.  Musculoskeletal: Normal range of motion. He exhibits tenderness (B hips). He exhibits no edema.  B hips are tender w/ROM and stiff w/restricted ROM LS is tender  Lymphadenopathy:    He has no cervical adenopathy.  Neurological: He is alert and oriented to person, place, and time. He has normal reflexes. No cranial nerve deficit. He exhibits normal muscle tone. Coordination normal.  Skin: Skin is warm and dry. No rash noted.  Psychiatric: His behavior is normal. Judgment and thought content normal.  Upset, depressed  Cane  Lab Results  Component Value Date   WBC 5.4 07/30/2012   HGB 12.2* 07/30/2012   HCT 36.0* 07/30/2012   PLT 125* 07/30/2012   GLUCOSE 89 07/30/2012   CHOL 182 01/18/2010   TRIG 258.0* 01/18/2010   HDL 31.00* 01/18/2010   LDLDIRECT 117.3 01/18/2010   LDLCALC 84 12/11/2006    ALT 20 07/30/2012   AST 44* 07/30/2012   NA 144 07/30/2012   K 4.7 07/30/2012   CL 108 07/30/2012   CREATININE 1.20 07/30/2012   BUN 23 07/30/2012   CO2 21 07/30/2012   TSH 1.16 01/18/2010   PSA 1.41 10/30/2010   INR 1.1 04/27/2007   HGBA1C 5.7 02/11/2011    RADIOLOGY REPORT*  Clinical Data: Right hip pain  RIGHT HIP - COMPLETE 2+ VIEW  Comparison: None.  Findings: Frontal pelvis with AP and frog-leg lateral views of the  right hip show no evidence for an acute fracture. The patient is  status post lower lumbar fusion. There is near complete loss of  joint space in the upper right hip with associated subchondral  sclerosis and hypertrophic spurring. Similar but less advanced  changes are noted in the left hip. No worrisome lytic or sclerotic  osseous abnormality.  IMPRESSION:  Right hip osteoarthritis.  07/30/12 LS MRI: IMPRESSION:  Satisfactory appearance of the fusion level of L4-5.  Disc bulges at L2-3 and L3-4. Mild narrowing of the lateral  recesses at those levels without definite neural compression.  Original Report Authenticated By: Nelson Chimes, M.D.       Assessment & Plan:

## 2012-08-10 ENCOUNTER — Other Ambulatory Visit: Payer: Self-pay

## 2012-08-10 DIAGNOSIS — M545 Low back pain: Secondary | ICD-10-CM

## 2012-08-11 LAB — DRUG SCREEN, URINE
Amphetamine Screen, Ur: POSITIVE — AB
Benzodiazepines.: POSITIVE — AB
Marijuana Metabolite: POSITIVE — AB
Phencyclidine (PCP): NEGATIVE
Propoxyphene: NEGATIVE

## 2012-09-03 ENCOUNTER — Encounter: Payer: Self-pay | Admitting: Internal Medicine

## 2012-09-10 ENCOUNTER — Encounter: Payer: Self-pay | Admitting: Internal Medicine

## 2012-09-10 ENCOUNTER — Ambulatory Visit (INDEPENDENT_AMBULATORY_CARE_PROVIDER_SITE_OTHER): Payer: Self-pay | Admitting: Internal Medicine

## 2012-09-10 VITALS — BP 130/84 | HR 80 | Temp 98.4°F | Resp 16 | Wt 216.0 lb

## 2012-09-10 DIAGNOSIS — R300 Dysuria: Secondary | ICD-10-CM

## 2012-09-10 MED ORDER — OXYCODONE HCL 10 MG PO TABS: 10.0000 mg | ORAL_TABLET | Freq: Four times a day (QID) | ORAL | Status: DC | PRN

## 2012-09-10 NOTE — Progress Notes (Signed)
Subjective:    HPI  Hip Pain  The pain is present in the right thigh and right leg. The pain is at a severity of 9/10. The pain is severe. The pain has been constant since onset. The treatment provided moderate relief.  Back Pain  This is a chronic problem. The current episode started more than 1 year ago. The pain is present in the lumbar spine and gluteal. The quality of the pain is described as aching and burning. The pain is at a severity of 9/10. The pain is severe. The symptoms are aggravated by bending, position, standing and twisting. Stiffness is present all day. Pertinent negatives include no chest pain or weakness. He has tried analgesics and muscle relaxant for the symptoms. The treatment provided mild relief.  Norco is not helping  He took pain meds, clonazepam last on 4/20  The patient presents for a follow-up of hypogonadism, chronic hypertension, chronic dyslipidemia, type 2 diabetes controlled with medicines. C/o ED  C/o R groin and R thigh pain - xrays -- severe OA  C/o being depressed. Seeing a therapist...  C/o severe pain in R hip worse with standing  It is very hard for him to Work due to hip pain  C/o depression, tearful - his job is ending soon...  Wt Readings from Last 3 Encounters:  09/10/12 216 lb (97.977 kg)  08/09/12 224 lb (101.606 kg)  07/30/12 214 lb (97.07 kg)   BP Readings from Last 3 Encounters:  09/10/12 130/84  08/09/12 148/90  07/30/12 111/60       Review of Systems  Constitutional: Negative for appetite change, fatigue and unexpected weight change.  HENT: Negative for nosebleeds, congestion, sore throat, sneezing, trouble swallowing and neck pain.   Eyes: Negative for itching and visual disturbance.  Respiratory: Negative for cough.   Cardiovascular: Negative for chest pain, palpitations and leg swelling.  Gastrointestinal: Negative for nausea, diarrhea, blood in stool and abdominal distention.  Genitourinary: Negative for frequency and  hematuria.  Musculoskeletal: Positive for back pain, arthralgias and gait problem. Negative for joint swelling.  Skin: Negative for rash.  Neurological: Negative for dizziness, tremors, speech difficulty and weakness.  Psychiatric/Behavioral: Positive for sleep disturbance. Negative for suicidal ideas, dysphoric mood and agitation. The patient is nervous/anxious.        Objective:   Physical Exam  Constitutional: He is oriented to person, place, and time. He appears well-developed. No distress.  HENT:  Mouth/Throat: Oropharynx is clear and moist.  Eyes: Conjunctivae are normal. Pupils are equal, round, and reactive to light.  Neck: Normal range of motion. No JVD present. No thyromegaly present.  Cardiovascular: Normal rate, regular rhythm, normal heart sounds and intact distal pulses.  Exam reveals no gallop and no friction rub.   No murmur heard. Pulmonary/Chest: Effort normal and breath sounds normal. No respiratory distress. He has no wheezes. He has no rales. He exhibits no tenderness.  Abdominal: Soft. Bowel sounds are normal. He exhibits no distension and no mass. There is no tenderness. There is no rebound and no guarding.  Musculoskeletal: Normal range of motion. He exhibits tenderness (R hip, LS pine). He exhibits no edema.  Lymphadenopathy:    He has no cervical adenopathy.  Neurological: He is alert and oriented to person, place, and time. He has normal reflexes. No cranial nerve deficit. He exhibits normal muscle tone. Coordination normal.  Skin: Skin is warm and dry. No rash noted.  Psychiatric: His behavior is normal. Judgment and thought content normal.  tearful    Lab Results  Component Value Date   WBC 5.4 07/30/2012   HGB 12.2* 07/30/2012   HCT 36.0* 07/30/2012   PLT 125* 07/30/2012   GLUCOSE 89 07/30/2012   CHOL 182 01/18/2010   TRIG 258.0* 01/18/2010   HDL 31.00* 01/18/2010   LDLDIRECT 117.3 01/18/2010   LDLCALC 84 12/11/2006   ALT 20 07/30/2012   AST 44* 07/30/2012    NA 144 07/30/2012   K 4.7 07/30/2012   CL 108 07/30/2012   CREATININE 1.20 07/30/2012   BUN 23 07/30/2012   CO2 21 07/30/2012   TSH 1.16 01/18/2010   PSA 1.41 10/30/2010   INR 1.1 04/27/2007   HGBA1C 5.7 02/11/2011         Assessment & Plan:

## 2012-09-10 NOTE — Progress Notes (Deleted)
Patient ID: [name], male   DOB: February 06, 1966, 47 y.o.   MRN: [id number]

## 2012-10-12 ENCOUNTER — Other Ambulatory Visit: Payer: Self-pay | Admitting: Internal Medicine

## 2012-10-12 ENCOUNTER — Encounter: Payer: Self-pay | Admitting: Internal Medicine

## 2012-10-12 MED ORDER — HYDROCODONE-ACETAMINOPHEN 5-325 MG PO TABS: 1.0000 | ORAL_TABLET | ORAL | Status: DC | PRN

## 2012-10-18 ENCOUNTER — Encounter: Payer: Self-pay | Admitting: Internal Medicine

## 2012-10-19 DIAGNOSIS — J189 Pneumonia, unspecified organism: Secondary | ICD-10-CM

## 2012-10-19 HISTORY — DX: Pneumonia, unspecified organism: J18.9

## 2012-10-20 ENCOUNTER — Other Ambulatory Visit: Payer: Self-pay | Admitting: Internal Medicine

## 2012-10-20 MED ORDER — HYDROCODONE-ACETAMINOPHEN 10-325 MG PO TABS: 0.5000 | ORAL_TABLET | Freq: Four times a day (QID) | ORAL | Status: DC | PRN

## 2012-10-24 ENCOUNTER — Encounter: Payer: Self-pay | Admitting: Internal Medicine

## 2012-10-26 ENCOUNTER — Telehealth: Payer: Self-pay | Admitting: *Deleted

## 2012-10-26 NOTE — Telephone Encounter (Signed)
Pt called requesting status of Norco 10-325mg  Rx.  I verified with [facility] that they had not received Rx dated 7.2.14 from Dr [name].  Gave verbal refill.  Notified pt refill at pharmacy.

## 2012-10-27 ENCOUNTER — Encounter: Payer: Self-pay | Admitting: Internal Medicine

## 2012-10-28 ENCOUNTER — Emergency Department (HOSPITAL_COMMUNITY)
Admission: EM | Admit: 2012-10-28 | Discharge: 2012-10-28 | Disposition: A | Discharge status: Home / Self Care | Payer: Self-pay | Attending: Emergency Medicine | Admitting: Emergency Medicine

## 2012-10-28 ENCOUNTER — Telehealth: Payer: Self-pay | Admitting: Internal Medicine

## 2012-10-28 ENCOUNTER — Emergency Department (HOSPITAL_COMMUNITY): Payer: Self-pay

## 2012-10-28 ENCOUNTER — Encounter (HOSPITAL_COMMUNITY): Payer: Self-pay | Admitting: Emergency Medicine

## 2012-10-28 DIAGNOSIS — R42 Dizziness and giddiness: Secondary | ICD-10-CM | POA: Insufficient documentation

## 2012-10-28 DIAGNOSIS — J159 Unspecified bacterial pneumonia: Secondary | ICD-10-CM | POA: Insufficient documentation

## 2012-10-28 DIAGNOSIS — R0989 Other specified symptoms and signs involving the circulatory and respiratory systems: Secondary | ICD-10-CM | POA: Insufficient documentation

## 2012-10-28 DIAGNOSIS — Z8739 Personal history of other diseases of the musculoskeletal system and connective tissue: Secondary | ICD-10-CM | POA: Insufficient documentation

## 2012-10-28 DIAGNOSIS — I10 Essential (primary) hypertension: Secondary | ICD-10-CM | POA: Insufficient documentation

## 2012-10-28 DIAGNOSIS — E291 Testicular hypofunction: Secondary | ICD-10-CM | POA: Insufficient documentation

## 2012-10-28 DIAGNOSIS — F988 Other specified behavioral and emotional disorders with onset usually occurring in childhood and adolescence: Secondary | ICD-10-CM | POA: Insufficient documentation

## 2012-10-28 DIAGNOSIS — R0609 Other forms of dyspnea: Secondary | ICD-10-CM | POA: Insufficient documentation

## 2012-10-28 DIAGNOSIS — F319 Bipolar disorder, unspecified: Secondary | ICD-10-CM | POA: Insufficient documentation

## 2012-10-28 DIAGNOSIS — Z79899 Other long term (current) drug therapy: Secondary | ICD-10-CM | POA: Insufficient documentation

## 2012-10-28 DIAGNOSIS — F411 Generalized anxiety disorder: Secondary | ICD-10-CM | POA: Insufficient documentation

## 2012-10-28 DIAGNOSIS — F329 Major depressive disorder, single episode, unspecified: Secondary | ICD-10-CM | POA: Insufficient documentation

## 2012-10-28 DIAGNOSIS — R112 Nausea with vomiting, unspecified: Secondary | ICD-10-CM | POA: Insufficient documentation

## 2012-10-28 DIAGNOSIS — J189 Pneumonia, unspecified organism: Secondary | ICD-10-CM

## 2012-10-28 DIAGNOSIS — F17200 Nicotine dependence, unspecified, uncomplicated: Secondary | ICD-10-CM | POA: Insufficient documentation

## 2012-10-28 DIAGNOSIS — Z87448 Personal history of other diseases of urinary system: Secondary | ICD-10-CM | POA: Insufficient documentation

## 2012-10-28 DIAGNOSIS — F3289 Other specified depressive episodes: Secondary | ICD-10-CM | POA: Insufficient documentation

## 2012-10-28 LAB — COMPREHENSIVE METABOLIC PANEL
ALT: 15 U/L (ref 0–53)
AST: 13 U/L (ref 0–37)
Alkaline Phosphatase: 87 U/L (ref 39–117)
CO2: 25 mEq/L (ref 19–32)
Calcium: 9.4 mg/dL (ref 8.4–10.5)
Chloride: 104 mEq/L (ref 96–112)
GFR calc Af Amer: >90 mL/min (ref >90)
GFR calc non Af Amer: >90 mL/min (ref >90)
Glucose, Bld: 187 mg/dL — ABNORMAL HIGH (ref 70–99)
Potassium: 3.7 mEq/L (ref 3.5–5.1)
Sodium: 139 mEq/L (ref 135–145)
Total Bilirubin: 0.3 mg/dL (ref 0.3–1.2)

## 2012-10-28 LAB — CBC
Hemoglobin: 11.6 g/dL — ABNORMAL LOW (ref 13.0–17.0)
MCH: 29.8 pg (ref 26.0–34.0)
Platelets: 180 10*3/uL (ref 150–400)
RBC: 3.89 MIL/uL — ABNORMAL LOW (ref 4.22–5.81)
WBC: 9.1 10*3/uL (ref 4.0–10.5)

## 2012-10-28 LAB — POCT I-STAT TROPONIN I: Troponin i, poc: 0 ng/mL (ref 0.00–0.08)

## 2012-10-28 MED ORDER — HYDROCODONE-ACETAMINOPHEN 10-325 MG PO TABS: 0.5000 | ORAL_TABLET | Freq: Three times a day (TID) | ORAL | Status: DC | PRN

## 2012-10-28 MED ORDER — LEVOFLOXACIN 500 MG PO TABS
500.0000 mg | ORAL_TABLET | Freq: Once | ORAL | Status: AC
  Administered 2012-10-28: 500 mg via ORAL
  Filled 2012-10-28: qty 1

## 2012-10-28 MED ORDER — HYDROCODONE-ACETAMINOPHEN 5-325 MG PO TABS
1.0000 | ORAL_TABLET | Freq: Once | ORAL | Status: AC
  Administered 2012-10-28: 1 via ORAL
  Filled 2012-10-28: qty 1

## 2012-10-28 MED ORDER — LEVOFLOXACIN 500 MG PO TABS: 500.0000 mg | ORAL_TABLET | Freq: Every day | ORAL | Status: AC

## 2012-10-28 MED ORDER — ASPIRIN 81 MG PO CHEW
324.0000 mg | CHEWABLE_TABLET | Freq: Once | ORAL | Status: AC
  Administered 2012-10-28: 324 mg via ORAL
  Filled 2012-10-28: qty 3

## 2012-10-28 NOTE — ED Notes (Signed)
Pt states chest pain came on 2 days ago. Pt states pain is intermittent in intensity. Pain started at L anterior chest and radiated to L axilla area. Pt states pain is now between L breast and back. Pt denies nausea. C/o slight shortness of breath. Pt reports pain is worse with deep inspiration and mvmt. Pt reports that he does smoke.

## 2012-10-28 NOTE — Telephone Encounter (Signed)
Caller Name: Bennette  Phone: [phone]  Patient: Jonathan, Gray  Gender: Male  DOB: 01-Aug-1965  Age: 47 Years  PCP: Plotnikov, Alex (Adults only)   Does the office need to follow up with this patient?: No  RN Note:  not relieved with pain meds, history of family member with lung cancer   Reason For Call & Symptoms: emergent call, back pain upper back, unable to take deep breaths  Reviewed Health History In EMR: Yes  Reviewed Medications In EMR: Yes  Reviewed Allergies In EMR: Yes  Reviewed Surgeries / Procedures: Yes  Date of Onset of Symptoms: 10/22/2012  Guideline(s) Used: Chest Pain  Disposition Per Guideline:  Go to ED Now  Reason For Disposition Reached:  Intermittent chest pain and pain has been increasing in severity or frequency  Advice Given:  Call Back If:  Severe chest pain  Difficulty breathing  You become worse.  Patient Will Follow Care Advice:  YES

## 2012-10-28 NOTE — ED Notes (Signed)
Pt c/o of left sided chest pain that radiates into shoulder around towards back x2 days. Associated with lightheadness, dizziness, when pain comes. Pain 3/10 right now.

## 2012-10-28 NOTE — ED Provider Notes (Signed)
History    CSN: [id number] Arrival date & time 10/28/12  1515  First MD Initiated Contact with Patient 10/28/12 1536     Chief Complaint  Patient presents with  . Chest Pain   (Consider location/radiation/quality/duration/timing/severity/associated sxs/prior Treatment) HPI Patient presents with several days of left-sided chest pain.  Initially the pain was left anterior-inferior axilla.  Over the past days the pain has migrated towards the posterior axilla.  The pain is associated with mild lightheadedness, dizziness, but minimal dyspnea, and no exertional pain. There is pain in that area with deep inspiration. The patient does not describe fevers, chills, but does endorse nausea with vomiting. No clear exacerbating factors beyond deep inspiration. No clear alleviating factors. Patient was in his usual state of health prior to the onset of symptoms.   Patient does smoke, and was counseled on the need to stop this  Past Medical History  Diagnosis Date  . Anxiety   . Hypertension   . Depression     bipolar [facility]  . Hypogonadism male   . ADD (attention deficit disorder)   . Microscopic hematuria     hereditary s/p Urology eval  . Bipolar 1 disorder   . Arthritis     right hip   Past Surgical History  Procedure Laterality Date  . Lumbar disc surgery    . Back surgery     Family History  Problem Relation Age of Onset  . Diabetes Father   . Cancer Mother     died of melanoma with mets  . Cervical cancer Sister   . Diabetes Sister    History  Substance Use Topics  . Smoking status: Current Every Day Smoker -- 0.20 packs/day    Types: Cigarettes  . Smokeless tobacco: Never Used  . Alcohol Use: No    Review of Systems  Constitutional:       Per HPI, otherwise negative  HENT:       Per HPI, otherwise negative  Respiratory:       Per HPI, otherwise negative  Cardiovascular:       Per HPI, otherwise negative  Gastrointestinal: Negative for vomiting.   Endocrine:       Negative aside from HPI  Genitourinary:       Neg aside from HPI   Musculoskeletal:       Per HPI, otherwise negative  Skin: Negative.   Neurological: Negative for syncope.    Allergies  Review of patient's allergies indicates no known allergies.  Home Medications   Current Outpatient Rx  Name  Route  Sig  Dispense  Refill  . buPROPion (WELLBUTRIN SR) 150 MG 12 hr tablet   Oral   Take 150 mg by mouth 3 (three) times daily.          . celecoxib (CELEBREX) 200 MG capsule   Oral   Take 200 mg by mouth 2 (two) times daily.         . cholecalciferol (VITAMIN D) 1000 UNITS tablet   Oral   Take 2,000 Units by mouth every morning.          . clonazePAM (KLONOPIN) 2 MG tablet   Oral   Take 1 tablet (2 mg total) by mouth 4 (four) times daily.   120 tablet   2   . cyanocobalamin 500 MCG tablet   Oral   Take 500 mcg by mouth every morning.          . divalproex (DEPAKOTE ER) 250 MG  24 hr tablet   Oral   Take 500 mg by mouth at bedtime.          Donnie Aho (GLUCOSAMINE MSM COMPLEX PO)   Oral   Take 2 capsules by mouth 2 (two) times daily.          Marland Kitchen ibuprofen (ADVIL,MOTRIN) 200 MG tablet   Oral   Take 600 mg by mouth every 8 (eight) hours as needed for pain.          Marland Kitchen lisdexamfetamine (VYVANSE) 20 MG capsule   Oral   Take 20 mg by mouth every morning.         . Multiple Vitamin (MULTIVITAMIN WITH MINERALS) TABS   Oral   Take 1 tablet by mouth every morning.         Marland Kitchen QUEtiapine (SEROQUEL XR) 200 MG 24 hr tablet   Oral   Take 400 mg by mouth at bedtime.          . tadalafil (CIALIS) 5 MG tablet   Oral   Take 1 tablet (5 mg total) by mouth daily as needed.   30 tablet   3   . Testosterone (ANDROGEL) 20.25 MG/1.25GM (1.62%) GEL   Transdermal   Place 3 application onto the skin daily.   215 g   3   . HYDROcodone-acetaminophen (NORCO) 10-325 MG per tablet   Oral   Take 0.5-1 tablets by mouth every  8 (eight) hours as needed for pain.   15 tablet   0   . levofloxacin (LEVAQUIN) 500 MG tablet   Oral   Take 1 tablet (500 mg total) by mouth daily.   7 tablet   0    BP 133/69  Pulse 90  Temp(Src) 100.6 F (38.1 C) (Oral)  Resp 20  SpO2 99% Physical Exam  Nursing note and vitals reviewed. Constitutional: He is oriented to person, place, and time. He appears well-developed. No distress.  HENT:  Head: Normocephalic and atraumatic.  Eyes: Conjunctivae and EOM are normal.  Cardiovascular: Normal rate and regular rhythm.   Pulmonary/Chest: Effort normal. No stridor. No respiratory distress.  Abdominal: He exhibits no distension.  Musculoskeletal: He exhibits no edema.  Neurological: He is alert and oriented to person, place, and time.  Skin: Skin is warm and dry.  Psychiatric: He has a normal mood and affect.    ED Course  Procedures (including critical care time) Labs Reviewed  CBC - Abnormal; Notable for the following:    RBC 3.89 (*)    Hemoglobin 11.6 (*)    HCT 33.2 (*)    All other components within normal limits  COMPREHENSIVE METABOLIC PANEL - Abnormal; Notable for the following:    Glucose, Bld 187 (*)    Albumin 3.2 (*)    All other components within normal limits  PROTIME-INR  POCT I-STAT TROPONIN I   Dg Chest 2 View  10/28/2012   *RADIOLOGY REPORT*  Clinical Data: Chest pain with cough and fever  CHEST - 2 VIEW  Comparison: April 29, 2007  Findings:  There is a focal consolidation in the lateral left base. Elsewhere, lungs clear.  Heart size and pulmonary vascularity are normal.  No adenopathy.  No bone lesions. No pneumothorax.  IMPRESSION: Consolidation lateral left base.  Followup study in 7 to 10 days is advised to assess for clearing. If this area of consolidation persists on 7-day-10 day follow-up examination, chest CT at that time would be advised to exclude the possibility of underlying  mass.  Elsewhere, lungs clear.   Original Report Authenticated By:  Lowella Grip, M.D.   1. CAP (community acquired pneumonia)     Date: 10/28/2012  Rate: 86  Rhythm: normal sinus rhythm  QRS Axis: normal  Intervals: normal  ST/T Wave abnormalities: normal  Conduction Disutrbances: none  Narrative Interpretation: unremarkable  Cardiac: 81sr, normal  O2- 99%ra, normal   MDM  Patient presents with several days of the chest pain, and on exam is awake however, hypoxia, tachypnea, tachycardia.  Patient has risk factor of smoking for ACS, but given his description of pain for several days, and the negative troponin, this is a, a possibility.  Patient's x-ray demonstrates opacification in the left lower lobe, and given this finding, the patient's borderline fever, he was treated for community acquired pneumonia.  He has a primary care physician with whom he will followup next week for repeat evaluation.  Carmin Muskrat, MD 10/28/12 [phone]

## 2012-10-28 NOTE — ED Notes (Signed)
Pt has a ride home.  

## 2012-10-28 NOTE — ED Notes (Signed)
MD at bedside. Dr. Lockwood at bedside.  

## 2012-10-31 ENCOUNTER — Emergency Department (HOSPITAL_COMMUNITY): Payer: Self-pay

## 2012-10-31 ENCOUNTER — Encounter (HOSPITAL_COMMUNITY): Payer: Self-pay | Admitting: Emergency Medicine

## 2012-10-31 ENCOUNTER — Inpatient Hospital Stay (HOSPITAL_COMMUNITY)
Admission: EM | Admit: 2012-10-31 | Discharge: 2012-11-08 | DRG: 193 | Disposition: A | Discharge status: Home / Self Care | Payer: MEDICAID | Attending: Internal Medicine | Admitting: Internal Medicine

## 2012-10-31 DIAGNOSIS — R0902 Hypoxemia: Secondary | ICD-10-CM | POA: Diagnosis not present

## 2012-10-31 DIAGNOSIS — Z79899 Other long term (current) drug therapy: Secondary | ICD-10-CM

## 2012-10-31 DIAGNOSIS — D649 Anemia, unspecified: Secondary | ICD-10-CM

## 2012-10-31 DIAGNOSIS — R453 Demoralization and apathy: Secondary | ICD-10-CM

## 2012-10-31 DIAGNOSIS — J90 Pleural effusion, not elsewhere classified: Secondary | ICD-10-CM | POA: Diagnosis present

## 2012-10-31 DIAGNOSIS — D638 Anemia in other chronic diseases classified elsewhere: Secondary | ICD-10-CM | POA: Diagnosis present

## 2012-10-31 DIAGNOSIS — F411 Generalized anxiety disorder: Secondary | ICD-10-CM

## 2012-10-31 DIAGNOSIS — M1611 Unilateral primary osteoarthritis, right hip: Secondary | ICD-10-CM

## 2012-10-31 DIAGNOSIS — Z791 Long term (current) use of non-steroidal anti-inflammatories (NSAID): Secondary | ICD-10-CM

## 2012-10-31 DIAGNOSIS — K5904 Chronic idiopathic constipation: Secondary | ICD-10-CM

## 2012-10-31 DIAGNOSIS — N529 Male erectile dysfunction, unspecified: Secondary | ICD-10-CM

## 2012-10-31 DIAGNOSIS — D509 Iron deficiency anemia, unspecified: Secondary | ICD-10-CM | POA: Diagnosis present

## 2012-10-31 DIAGNOSIS — F319 Bipolar disorder, unspecified: Secondary | ICD-10-CM

## 2012-10-31 DIAGNOSIS — D696 Thrombocytopenia, unspecified: Secondary | ICD-10-CM | POA: Diagnosis present

## 2012-10-31 DIAGNOSIS — M545 Low back pain: Secondary | ICD-10-CM

## 2012-10-31 DIAGNOSIS — M25551 Pain in right hip: Secondary | ICD-10-CM

## 2012-10-31 DIAGNOSIS — F17200 Nicotine dependence, unspecified, uncomplicated: Secondary | ICD-10-CM

## 2012-10-31 DIAGNOSIS — F329 Major depressive disorder, single episode, unspecified: Secondary | ICD-10-CM

## 2012-10-31 DIAGNOSIS — I498 Other specified cardiac arrhythmias: Secondary | ICD-10-CM | POA: Diagnosis not present

## 2012-10-31 DIAGNOSIS — M25552 Pain in left hip: Secondary | ICD-10-CM

## 2012-10-31 DIAGNOSIS — R3129 Other microscopic hematuria: Secondary | ICD-10-CM

## 2012-10-31 DIAGNOSIS — J189 Pneumonia, unspecified organism: Secondary | ICD-10-CM

## 2012-10-31 DIAGNOSIS — F988 Other specified behavioral and emotional disorders with onset usually occurring in childhood and adolescence: Secondary | ICD-10-CM | POA: Diagnosis present

## 2012-10-31 DIAGNOSIS — M1610 Unilateral primary osteoarthritis, unspecified hip: Secondary | ICD-10-CM | POA: Diagnosis present

## 2012-10-31 DIAGNOSIS — N50811 Right testicular pain: Secondary | ICD-10-CM

## 2012-10-31 DIAGNOSIS — J9600 Acute respiratory failure, unspecified whether with hypoxia or hypercapnia: Secondary | ICD-10-CM | POA: Diagnosis not present

## 2012-10-31 DIAGNOSIS — J154 Pneumonia due to other streptococci: Principal | ICD-10-CM | POA: Diagnosis present

## 2012-10-31 DIAGNOSIS — E291 Testicular hypofunction: Secondary | ICD-10-CM | POA: Diagnosis present

## 2012-10-31 DIAGNOSIS — E1165 Type 2 diabetes mellitus with hyperglycemia: Secondary | ICD-10-CM | POA: Diagnosis present

## 2012-10-31 DIAGNOSIS — F3289 Other specified depressive episodes: Secondary | ICD-10-CM

## 2012-10-31 DIAGNOSIS — I10 Essential (primary) hypertension: Secondary | ICD-10-CM

## 2012-10-31 DIAGNOSIS — E119 Type 2 diabetes mellitus without complications: Secondary | ICD-10-CM

## 2012-10-31 DIAGNOSIS — F250 Schizoaffective disorder, bipolar type: Secondary | ICD-10-CM | POA: Diagnosis present

## 2012-10-31 HISTORY — DX: Pneumonia, unspecified organism: J18.9

## 2012-10-31 LAB — COMPREHENSIVE METABOLIC PANEL
AST: 23 U/L (ref 0–37)
Albumin: 3.4 g/dL — ABNORMAL LOW (ref 3.5–5.2)
Alkaline Phosphatase: 92 U/L (ref 39–117)
BUN: 20 mg/dL (ref 6–23)
Chloride: 103 mEq/L (ref 96–112)
Potassium: 4.3 mEq/L (ref 3.5–5.1)
Total Bilirubin: 0.4 mg/dL (ref 0.3–1.2)

## 2012-10-31 LAB — TROPONIN I: Troponin I: <0.3 ng/mL (ref <0.30)

## 2012-10-31 LAB — CBC WITH DIFFERENTIAL/PLATELET
Basophils Absolute: 0 10*3/uL (ref 0.0–0.1)
Eosinophils Absolute: 0.1 10*3/uL (ref 0.0–0.7)
Eosinophils Relative: 1 % (ref 0–5)
Lymphs Abs: 0.8 10*3/uL (ref 0.7–4.0)
MCH: 29.8 pg (ref 26.0–34.0)
MCV: 84.6 fL (ref 78.0–100.0)
Platelets: 125 10*3/uL — ABNORMAL LOW (ref 150–400)
RDW: 12.7 % (ref 11.5–15.5)

## 2012-10-31 LAB — BASIC METABOLIC PANEL
BUN: 16 mg/dL (ref 6–23)
GFR calc Af Amer: >90 mL/min (ref >90)
GFR calc non Af Amer: >90 mL/min (ref >90)
Potassium: 4.2 mEq/L (ref 3.5–5.1)

## 2012-10-31 LAB — EXPECTORATED SPUTUM ASSESSMENT W GRAM STAIN, RFLX TO RESP C: Special Requests: NORMAL

## 2012-10-31 LAB — POCT I-STAT, CHEM 8
Creatinine, Ser: 1.1 mg/dL (ref 0.50–1.35)
Glucose, Bld: 213 mg/dL — ABNORMAL HIGH (ref 70–99)
Hemoglobin: 9.9 g/dL — ABNORMAL LOW (ref 13.0–17.0)
TCO2: 27 mmol/L (ref 0–100)

## 2012-10-31 LAB — STREP PNEUMONIAE URINARY ANTIGEN: Strep Pneumo Urinary Antigen: NEGATIVE

## 2012-10-31 MED ORDER — LISDEXAMFETAMINE DIMESYLATE 20 MG PO CAPS: 20.0000 mg | ORAL_CAPSULE | ORAL | Status: DC

## 2012-10-31 MED ORDER — ALBUTEROL SULFATE (5 MG/ML) 0.5% IN NEBU: 2.5000 mg | INHALATION_SOLUTION | RESPIRATORY_TRACT | Status: DC | PRN

## 2012-10-31 MED ORDER — PIPERACILLIN-TAZOBACTAM 3.375 G IVPB
3.3750 g | Freq: Three times a day (TID) | INTRAVENOUS | Status: DC
  Administered 2012-10-31 – 2012-11-06 (×18): 3.375 g via INTRAVENOUS
  Filled 2012-10-31 (×19): qty 50

## 2012-10-31 MED ORDER — HYDROMORPHONE HCL PF 1 MG/ML IJ SOLN
0.5000 mg | INTRAMUSCULAR | Status: DC | PRN
  Administered 2012-10-31 – 2012-11-02 (×10): 1 mg via INTRAVENOUS
  Filled 2012-10-31 (×10): qty 1

## 2012-10-31 MED ORDER — CLONAZEPAM 1 MG PO TABS
2.0000 mg | ORAL_TABLET | Freq: Four times a day (QID) | ORAL | Status: DC
  Administered 2012-10-31 – 2012-11-01 (×8): 2 mg via ORAL
  Filled 2012-10-31 (×3): qty 2
  Filled 2012-10-31: qty 1
  Filled 2012-10-31: qty 2
  Filled 2012-10-31 (×2): qty 1
  Filled 2012-10-31 (×2): qty 2
  Filled 2012-10-31: qty 1
  Filled 2012-10-31: qty 2

## 2012-10-31 MED ORDER — IOHEXOL 350 MG/ML SOLN
100.0000 mL | Freq: Once | INTRAVENOUS | Status: AC | PRN
  Administered 2012-10-31: 100 mL via INTRAVENOUS

## 2012-10-31 MED ORDER — ENOXAPARIN SODIUM 40 MG/0.4ML ~~LOC~~ SOLN
40.0000 mg | SUBCUTANEOUS | Status: DC
  Administered 2012-10-31 – 2012-11-08 (×9): 40 mg via SUBCUTANEOUS
  Filled 2012-10-31 (×9): qty 0.4

## 2012-10-31 MED ORDER — OXYCODONE HCL 5 MG PO TABS
5.0000 mg | ORAL_TABLET | ORAL | Status: DC | PRN
  Administered 2012-10-31 – 2012-11-02 (×6): 5 mg via ORAL
  Filled 2012-10-31 (×6): qty 1

## 2012-10-31 MED ORDER — TESTOSTERONE 50 MG/5GM (1%) TD GEL
5.0000 g | Freq: Every day | TRANSDERMAL | Status: DC
  Administered 2012-10-31 – 2012-11-06 (×3): 5 g via TRANSDERMAL
  Filled 2012-10-31 (×5): qty 5

## 2012-10-31 MED ORDER — MORPHINE SULFATE 4 MG/ML IJ SOLN
4.0000 mg | Freq: Once | INTRAMUSCULAR | Status: AC
  Administered 2012-10-31: 4 mg via INTRAVENOUS
  Filled 2012-10-31: qty 1

## 2012-10-31 MED ORDER — ALBUTEROL SULFATE (5 MG/ML) 0.5% IN NEBU
2.5000 mg | INHALATION_SOLUTION | Freq: Four times a day (QID) | RESPIRATORY_TRACT | Status: DC
  Administered 2012-10-31 – 2012-11-01 (×7): 2.5 mg via RESPIRATORY_TRACT
  Filled 2012-10-31 (×7): qty 0.5

## 2012-10-31 MED ORDER — NICOTINE 14 MG/24HR TD PT24
14.0000 mg | MEDICATED_PATCH | Freq: Every day | TRANSDERMAL | Status: DC
  Administered 2012-10-31 – 2012-11-08 (×9): 14 mg via TRANSDERMAL
  Filled 2012-10-31 (×9): qty 1

## 2012-10-31 MED ORDER — ALUM & MAG HYDROXIDE-SIMETH 200-200-20 MG/5ML PO SUSP
30.0000 mL | Freq: Four times a day (QID) | ORAL | Status: DC | PRN
  Administered 2012-11-03 – 2012-11-05 (×2): 30 mL via ORAL
  Filled 2012-10-31 (×2): qty 30

## 2012-10-31 MED ORDER — CYANOCOBALAMIN 500 MCG PO TABS
500.0000 ug | ORAL_TABLET | Freq: Every morning | ORAL | Status: DC
  Administered 2012-10-31 – 2012-11-08 (×9): 500 ug via ORAL
  Filled 2012-10-31 (×9): qty 1

## 2012-10-31 MED ORDER — PNEUMOCOCCAL VAC POLYVALENT 25 MCG/0.5ML IJ INJ
0.5000 mL | INJECTION | Freq: Once | INTRAMUSCULAR | Status: AC
  Administered 2012-10-31: 0.5 mL via INTRAMUSCULAR
  Filled 2012-10-31: qty 0.5

## 2012-10-31 MED ORDER — AZITHROMYCIN 500 MG PO TABS
500.0000 mg | ORAL_TABLET | Freq: Every day | ORAL | Status: DC
  Administered 2012-10-31 – 2012-11-05 (×6): 500 mg via ORAL
  Filled 2012-10-31 (×6): qty 1

## 2012-10-31 MED ORDER — PIPERACILLIN-TAZOBACTAM 3.375 G IVPB 30 MIN
3.3750 g | Freq: Once | INTRAVENOUS | Status: AC
  Administered 2012-10-31: 3.375 g via INTRAVENOUS
  Filled 2012-10-31: qty 50

## 2012-10-31 MED ORDER — SODIUM CHLORIDE 0.9 % IJ SOLN
3.0000 mL | Freq: Two times a day (BID) | INTRAMUSCULAR | Status: DC
  Administered 2012-11-02 – 2012-11-08 (×7): 3 mL via INTRAVENOUS

## 2012-10-31 MED ORDER — QUETIAPINE FUMARATE ER 400 MG PO TB24
400.0000 mg | ORAL_TABLET | Freq: Every day | ORAL | Status: DC
  Administered 2012-10-31 – 2012-11-07 (×8): 400 mg via ORAL
  Filled 2012-10-31 (×9): qty 1

## 2012-10-31 MED ORDER — SODIUM CHLORIDE 0.9 % IV SOLN
INTRAVENOUS | Status: DC
  Administered 2012-10-31 – 2012-11-02 (×4): via INTRAVENOUS
  Administered 2012-11-02: 50 mL via INTRAVENOUS

## 2012-10-31 MED ORDER — LISDEXAMFETAMINE DIMESYLATE 20 MG PO CAPS
20.0000 mg | ORAL_CAPSULE | ORAL | Status: DC
  Administered 2012-10-31 – 2012-11-08 (×9): 20 mg via ORAL
  Filled 2012-10-31 (×9): qty 1

## 2012-10-31 MED ORDER — VANCOMYCIN HCL IN DEXTROSE 1-5 GM/200ML-% IV SOLN
1000.0000 mg | Freq: Three times a day (TID) | INTRAVENOUS | Status: DC
  Administered 2012-10-31 – 2012-11-01 (×4): 1000 mg via INTRAVENOUS
  Filled 2012-10-31 (×6): qty 200

## 2012-10-31 MED ORDER — CELECOXIB 200 MG PO CAPS
200.0000 mg | ORAL_CAPSULE | Freq: Two times a day (BID) | ORAL | Status: DC
  Administered 2012-10-31 – 2012-11-02 (×5): 200 mg via ORAL
  Filled 2012-10-31 (×6): qty 1

## 2012-10-31 MED ORDER — ONDANSETRON HCL 4 MG PO TABS: 4.0000 mg | ORAL_TABLET | Freq: Four times a day (QID) | ORAL | Status: DC | PRN

## 2012-10-31 MED ORDER — ACETAMINOPHEN 650 MG RE SUPP: 650.0000 mg | Freq: Four times a day (QID) | RECTAL | Status: DC | PRN

## 2012-10-31 MED ORDER — VANCOMYCIN HCL IN DEXTROSE 1-5 GM/200ML-% IV SOLN
1000.0000 mg | Freq: Once | INTRAVENOUS | Status: AC
  Administered 2012-10-31: 1000 mg via INTRAVENOUS
  Filled 2012-10-31: qty 200

## 2012-10-31 MED ORDER — ACETAMINOPHEN 325 MG PO TABS
650.0000 mg | ORAL_TABLET | Freq: Four times a day (QID) | ORAL | Status: DC | PRN
  Administered 2012-11-04: 650 mg via ORAL
  Filled 2012-10-31: qty 2

## 2012-10-31 MED ORDER — SODIUM CHLORIDE 0.9 % IV SOLN: INTRAVENOUS | Status: AC

## 2012-10-31 MED ORDER — ADULT MULTIVITAMIN W/MINERALS CH
1.0000 | ORAL_TABLET | Freq: Every morning | ORAL | Status: DC
  Administered 2012-10-31 – 2012-11-08 (×9): 1 via ORAL
  Filled 2012-10-31 (×9): qty 1

## 2012-10-31 MED ORDER — SODIUM CHLORIDE 0.9 % IV BOLUS (SEPSIS)
1000.0000 mL | Freq: Once | INTRAVENOUS | Status: AC
  Administered 2012-10-31: 1000 mL via INTRAVENOUS

## 2012-10-31 MED ORDER — ZOLPIDEM TARTRATE 5 MG PO TABS: 5.0000 mg | ORAL_TABLET | Freq: Every evening | ORAL | Status: DC | PRN

## 2012-10-31 MED ORDER — DIVALPROEX SODIUM ER 500 MG PO TB24
500.0000 mg | ORAL_TABLET | Freq: Every day | ORAL | Status: DC
  Administered 2012-10-31 – 2012-11-07 (×8): 500 mg via ORAL
  Filled 2012-10-31 (×9): qty 1

## 2012-10-31 MED ORDER — VITAMIN D3 25 MCG (1000 UNIT) PO TABS
2000.0000 [IU] | ORAL_TABLET | Freq: Every morning | ORAL | Status: DC
  Administered 2012-10-31 – 2012-11-08 (×9): 2000 [IU] via ORAL
  Filled 2012-10-31 (×9): qty 2

## 2012-10-31 MED ORDER — ONDANSETRON HCL 4 MG/2ML IJ SOLN: 4.0000 mg | Freq: Four times a day (QID) | INTRAMUSCULAR | Status: DC | PRN

## 2012-10-31 MED ORDER — BUPROPION HCL ER (SR) 150 MG PO TB12
150.0000 mg | ORAL_TABLET | Freq: Three times a day (TID) | ORAL | Status: DC
  Administered 2012-10-31 – 2012-11-02 (×7): 150 mg via ORAL
  Filled 2012-10-31 (×9): qty 1

## 2012-10-31 NOTE — ED Notes (Signed)
Pt arrived to the Ed with a chief complaint of pain in the extremities.  Pt was seen last week and diagnosed with pneumonia.  Pt also claims to have osteoarthritis.  Pt prescribed antibiotic and hydrocodone but has been unable to achieve relief.

## 2012-10-31 NOTE — ED Provider Notes (Signed)
History    CSN: [id number] Arrival date & time 10/31/12  0000  First MD Initiated Contact with Patient 10/31/12 0124     Chief Complaint  Patient presents with  . Extremity Pain   (Consider location/radiation/quality/duration/timing/severity/associated sxs/prior Treatment) HPI Poor historian due to pain. States he was seen 2 days ago and diagnosed with left sided pneumonia. Started on levavaquin. States he has been compliant with meds. He has had increased L chest pain, worse with inspiration and movement. + cough productive of green sputum. Chills. Pt also c/o of diffuse myalgias.  Past Medical History  Diagnosis Date  . Anxiety   . Hypertension   . Depression     bipolar [facility]  . Hypogonadism male   . ADD (attention deficit disorder)   . Microscopic hematuria     hereditary s/p Urology eval  . Bipolar 1 disorder   . Arthritis     right hip   Past Surgical History  Procedure Laterality Date  . Lumbar disc surgery    . Back surgery     Family History  Problem Relation Age of Onset  . Diabetes Father   . Cancer Mother     died of melanoma with mets  . Cervical cancer Sister   . Diabetes Sister    History  Substance Use Topics  . Smoking status: Current Every Day Smoker -- 0.20 packs/day    Types: Cigarettes  . Smokeless tobacco: Never Used  . Alcohol Use: No    Review of Systems  Constitutional: Positive for chills and fatigue. Negative for fever.  HENT: Negative for neck pain.   Respiratory: Positive for cough and shortness of breath.   Cardiovascular: Positive for chest pain. Negative for palpitations and leg swelling.  Gastrointestinal: Positive for abdominal pain. Negative for nausea and vomiting.  Genitourinary: Negative for dysuria.  Musculoskeletal: Positive for myalgias and back pain.  Skin: Negative for rash and wound.  Neurological: Positive for light-headedness. Negative for weakness, numbness and headaches.  All other systems reviewed  and are negative.    Allergies  Review of patient's allergies indicates no known allergies.  Home Medications   Current Outpatient Rx  Name  Route  Sig  Dispense  Refill  . buPROPion (WELLBUTRIN SR) 150 MG 12 hr tablet   Oral   Take 150 mg by mouth 3 (three) times daily.          . celecoxib (CELEBREX) 200 MG capsule   Oral   Take 200 mg by mouth 2 (two) times daily.         . cholecalciferol (VITAMIN D) 1000 UNITS tablet   Oral   Take 2,000 Units by mouth every morning.          . clonazePAM (KLONOPIN) 2 MG tablet   Oral   Take 1 tablet (2 mg total) by mouth 4 (four) times daily.   120 tablet   2   . cyanocobalamin 500 MCG tablet   Oral   Take 500 mcg by mouth every morning.          . divalproex (DEPAKOTE ER) 250 MG 24 hr tablet   Oral   Take 500 mg by mouth at bedtime.          Donnie Aho (GLUCOSAMINE MSM COMPLEX PO)   Oral   Take 2 capsules by mouth 2 (two) times daily.          Marland Kitchen HYDROcodone-acetaminophen (NORCO) 10-325 MG per tablet   Oral  Take 0.5-1 tablets by mouth every 8 (eight) hours as needed for pain.   15 tablet   0   . ibuprofen (ADVIL,MOTRIN) 200 MG tablet   Oral   Take 600 mg by mouth every 8 (eight) hours as needed for pain.          Marland Kitchen levofloxacin (LEVAQUIN) 500 MG tablet   Oral   Take 1 tablet (500 mg total) by mouth daily.   7 tablet   0   . lisdexamfetamine (VYVANSE) 20 MG capsule   Oral   Take 20 mg by mouth every morning.         . Multiple Vitamin (MULTIVITAMIN WITH MINERALS) TABS   Oral   Take 1 tablet by mouth every morning.         Marland Kitchen QUEtiapine (SEROQUEL XR) 200 MG 24 hr tablet   Oral   Take 400 mg by mouth at bedtime.          . Testosterone (ANDROGEL) 20.25 MG/1.25GM (1.62%) GEL   Transdermal   Place 3 application onto the skin daily.   215 g   3   . tadalafil (CIALIS) 5 MG tablet   Oral   Take 1 tablet (5 mg total) by mouth daily as needed.   30 tablet   3    BP  113/57  Pulse 99  Temp(Src) 100 F (37.8 C) (Oral)  Resp 20  SpO2 96% Physical Exam  Nursing note and vitals reviewed. Constitutional: He is oriented to person, place, and time. He appears well-developed and well-nourished. He appears distressed.  Not opening eyes during exam  HENT:  Head: Normocephalic and atraumatic.  Mouth/Throat: Oropharynx is clear and moist. No oropharyngeal exudate.  Eyes: EOM are normal. Pupils are equal, round, and reactive to light.  Neck: Normal range of motion. Neck supple.  No meningeal signs   Cardiovascular: Normal rate and regular rhythm.  Exam reveals no gallop and no friction rub.   No murmur heard. Pulmonary/Chest: No respiratory distress. He has no wheezes. He has rales.  Shallow rapid breathing  Abdominal: Soft. Bowel sounds are normal. He exhibits no distension and no mass. There is tenderness (diffuse abdomninal tenderness). There is no rebound and no guarding.  Musculoskeletal: Normal range of motion. He exhibits no edema and no tenderness.  No calf swelling or pain  Neurological: He is alert and oriented to person, place, and time.  Moves all ext, no gross sensory deficits  Skin: Skin is warm and dry. No rash noted. No erythema.  Psychiatric:  Pt displays histrionic behavior.     ED Course  Procedures (including critical care time) Labs Reviewed  COMPREHENSIVE METABOLIC PANEL - Abnormal; Notable for the following:    Glucose, Bld 209 (*)    Albumin 3.4 (*)    GFR calc non Af Amer 88 (*)    All other components within normal limits  CBC WITH DIFFERENTIAL - Abnormal; Notable for the following:    RBC 3.12 (*)    Hemoglobin 9.3 (*)    HCT 26.4 (*)    Platelets 125 (*)    Neutrophils Relative % 82 (*)    Lymphocytes Relative 9 (*)    All other components within normal limits  POCT I-STAT, CHEM 8 - Abnormal; Notable for the following:    Glucose, Bld 213 (*)    Calcium, Ion 1.25 (*)    Hemoglobin 9.9 (*)    HCT 29.0 (*)    All  other components within  normal limits  CULTURE, BLOOD (ROUTINE X 2)  CULTURE, BLOOD (ROUTINE X 2)  TROPONIN I  CBC WITH DIFFERENTIAL  CG4 I-STAT (LACTIC ACID)   Dg Chest 2 View  10/31/2012   *RADIOLOGY REPORT*  Clinical Data: Chest pain and cough.  CHEST - 2 VIEW  Comparison: 10/28/2012.  Findings: Normal sized heart.  Increased patchy and linear opacity at the left lung base.  Moderate-sized left pleural effusion, increased.  Stable mild diffuse peribronchial thickening.  The heart remains grossly normal in size.  Positional scoliosis.  IMPRESSION:  1.  Increased left basilar pneumonia and atelectasis. 2.  Moderate-sized left pleural effusion, increased. 3.  Stable mild bronchitic changes.   Original Report Authenticated By: Claudie Revering, M.D.   Ct Angio Chest Pe W/cm &/or Wo Cm  10/31/2012   *RADIOLOGY REPORT*  Clinical Data: Recent diagnosis of pneumonia.  Pain in the extremities.  History of osteoarthritis.  CT ANGIOGRAPHY CHEST  Technique:  Multidetector CT imaging of the chest using the standard protocol during bolus administration of intravenous contrast. Multiplanar reconstructed images including MIPs were obtained and reviewed to evaluate the vascular anatomy.  Contrast: 168mL OMNIPAQUE IOHEXOL 350 MG/ML SOLN  Comparison: None.  Findings: Technically adequate study with moderately good opacification of the central and proximal segmental pulmonary arteries.  Distal segmental and peripheral vessels are not well opacified and poorly visualized.  No focal filling defect to suggest significant central pulmonary embolus.  Normal heart size.  Normal caliber thoracic aorta.  Scattered mediastinal lymph nodes are not pathologically enlarged.  Esophagus is decompressed.  Visualized portions of the upper abdominal organs are unremarkable.  Thyroid gland is homogeneous.  There is a small to moderate-sized left pleural effusion with consolidation demonstrated in the left lower lung and lingula and focal patchy  areas of consolidation in the right lung base and throughout the remainder the right lung.  Changes likely represent multifocal pneumonia.  Airways appear patent.  No pneumothorax. Emphysematous changes in the lung apices.  Degenerative changes in the thoracic spine.  IMPRESSION: No evidence of central pulmonary embolus although the contrast bolus is not sufficient to exclude emboli in the peripheral vessels.  Left pleural effusion with consolidation in the left lung base.  Patchy areas of infiltration in the right lung. Changes are likely represent multifocal pneumonia.   Original Report Authenticated By: Lucienne Capers, M.D.   1. Community acquired pneumonia   2. Pleural effusion      Date: 10/31/2012  Rate: 103  Rhythm: sinus tachycardia  QRS Axis: normal  Intervals: normal  ST/T Wave abnormalities: normal  Conduction Disutrbances:none  Narrative Interpretation:   Old EKG Reviewed: unchanged   MDM  Pt appears more comfortable. Breathing has normalized. Discussed with Dr Arnoldo Morale who will see and admit pt for failed outpatient treatment of community acquired pneumonia  Julianne Rice, MD 10/31/12 [phone]

## 2012-10-31 NOTE — H&P (Signed)
[facility] History and Physical  [name] [id number] DOB: 1965-11-29 DOA: 10/31/2012  Referring physician:  EDP PCP: [name], MD  Specialists:   Chief Complaint:   Worsening Cough and SOB  HPI: [name] is a 47 y.o. male who had been seen in th ED 2 days ago and was diagnosed with a LLL Pneumonia and placed on Levaquin to continue as an outpatient who returns with complaints of worsening sob and Cough and pleuritic chest pain.    He reports coughing up yellowish sputum, and reports having fever and chills off and on.   His pain is in his left side and is worst with deep breaths or coughing.    He was evalauted in the ED, and was found to have worsening of his PNA on a CTA of the Chest which was negative for pulmonary Emboli.   He was placed on IV antibiotic therapy of  Vancomycin and Zosyn and referred for medical admission.     Review of Systems: The patient denies anorexia, fever, chills, headaches, weight loss, vision loss, diplopia, dizziness, decreased hearing, rhinitis, hoarseness, chest pain, syncope, dyspnea on exertion, peripheral edema, balance deficits, cough, hemoptysis, abdominal pain, nausea, vomiting, diarrhea, constipation, hematemesis, melena, hematochezia, severe indigestion/heartburn, dysuria, hematuria, incontinence, muscle weakness, suspicious skin lesions, transient blindness, difficulty walking, depression, unusual weight change, abnormal bleeding, enlarged lymph nodes, angioedema, and breast masses.    Past Medical History  Diagnosis Date  . Anxiety   . Hypertension   . Depression     bipolar [facility]  . Hypogonadism male   . ADD (attention deficit disorder)   . Microscopic hematuria     hereditary s/p Urology eval  . Bipolar 1 disorder   . Arthritis     right hip    Past Surgical History  Procedure Laterality Date  . Lumbar disc surgery    . Back surgery      Prior to Admission medications   Medication Sig Start Date End  Date Taking? Authorizing Provider  buPROPion (WELLBUTRIN SR) 150 MG 12 hr tablet Take 150 mg by mouth 3 (three) times daily.    Yes Historical Provider, MD  celecoxib (CELEBREX) 200 MG capsule Take 200 mg by mouth 2 (two) times daily.   Yes Historical Provider, MD  cholecalciferol (VITAMIN D) 1000 UNITS tablet Take 2,000 Units by mouth every morning.    Yes Historical Provider, MD  clonazePAM (KLONOPIN) 2 MG tablet Take 1 tablet (2 mg total) by mouth 4 (four) times daily. 08/09/12  Yes Evie Lacks Plotnikov, MD  cyanocobalamin 500 MCG tablet Take 500 mcg by mouth every morning.    Yes Historical Provider, MD  divalproex (DEPAKOTE ER) 250 MG 24 hr tablet Take 500 mg by mouth at bedtime.    Yes Historical Provider, MD  Glucos-MSM-C-Mn-Ginger-Willow (GLUCOSAMINE MSM COMPLEX PO) Take 2 capsules by mouth 2 (two) times daily.    Yes Historical Provider, MD  HYDROcodone-acetaminophen (NORCO) 10-325 MG per tablet Take 0.5-1 tablets by mouth every 8 (eight) hours as needed for pain. 10/28/12  Yes Carmin Muskrat, MD  ibuprofen (ADVIL,MOTRIN) 200 MG tablet Take 600 mg by mouth every 8 (eight) hours as needed for pain.    Yes Historical Provider, MD  levofloxacin (LEVAQUIN) 500 MG tablet Take 1 tablet (500 mg total) by mouth daily. 10/29/12 11/03/12 Yes Carmin Muskrat, MD  lisdexamfetamine (VYVANSE) 20 MG capsule Take 20 mg by mouth every morning.   Yes Historical Provider, MD  Multiple Vitamin (MULTIVITAMIN  WITH MINERALS) TABS Take 1 tablet by mouth every morning.   Yes Historical Provider, MD  QUEtiapine (SEROQUEL XR) 200 MG 24 hr tablet Take 400 mg by mouth at bedtime.    Yes Historical Provider, MD  Testosterone (ANDROGEL) 20.25 MG/1.25GM (1.62%) GEL Place 3 application onto the skin daily. 04/02/12  Yes Evie Lacks Plotnikov, MD  tadalafil (CIALIS) 5 MG tablet Take 1 tablet (5 mg total) by mouth daily as needed. 08/09/12   Evie Lacks Plotnikov, MD    No Known Allergies     Social History:  reports that he has  been smoking Cigarettes.  He has been smoking about 0.20 packs per day. He has never used smokeless tobacco. He reports that he does not drink alcohol or use illicit drugs.     Family History  Problem Relation Age of Onset  . Diabetes Father   . Cancer Mother     died of melanoma with mets  . Cervical cancer Sister   . Diabetes Sister      Physical Exam:  GEN:  Pleasant Well nourished well developed 47 y.o. Caucasian male  examined  and in no acute distress; cooperative with exam Filed Vitals:   10/31/12 0032 10/31/12 0123 10/31/12 0310  BP: 148/67  113/57  Pulse: 102  99  Temp: 99 F (37.2 C)  100 F (37.8 C)  TempSrc: Oral  Oral  Resp: 20  20  SpO2: 96% 100% 96%   Blood pressure 113/57, pulse 99, temperature 100 F (37.8 C), temperature source Oral, resp. rate 20, SpO2 96.00%. PSYCH: He is alert and oriented x4; does not appear anxious does not appear depressed; affect is normal HEENT: Normocephalic and Atraumatic, Mucous membranes pink; PERRLA; EOM intact; Fundi:  Benign;  No scleral icterus, Nares: Patent, Oropharynx: Clear, Fair Dentition, Neck:  FROM, no cervical lymphadenopathy nor thyromegaly or carotid bruit; no JVD; Breasts:: Not examined CHEST WALL: No tenderness CHEST: Decreased Breath sounds, +Rhonchi throughout,  No rales,  No wheezes CV: Regular rate and rhythm; no murmurs rubs or gallops BACK: No kyphosis or scoliosis; no CVA tenderness ABDOMEN: Positive Bowel Sounds, soft non-tender; no masses, no organomegaly.   Rectal Exam: Not done EXTREMITIES: No cyanosis, clubbing or edema; no ulcerations. Genitalia: not examined PULSES: 2+ and symmetric SKIN: Normal hydration no rash or ulceration CNS: Cranial nerves 2-12 grossly intact no focal neurologic deficit    Labs on Admission:  Basic Metabolic Panel:  Recent Labs Lab 10/28/12 1600 10/31/12 0200 10/31/12 0225  NA 139 139 143  K 3.7 4.3 4.4  CL 104 103 105  CO2 25 26  --   GLUCOSE 187* 209* 213*   BUN 19 20 22   CREATININE 0.97 1.02 1.10  CALCIUM 9.4 9.6  --    Liver Function Tests:  Recent Labs Lab 10/28/12 1600 10/31/12 0200  AST 13 23  ALT 15 23  ALKPHOS 87 92  BILITOT 0.3 0.4  PROT 7.2 8.1  ALBUMIN 3.2* 3.4*   No results found for this basename: LIPASE, AMYLASE,  in the last 168 hours No results found for this basename: AMMONIA,  in the last 168 hours CBC:  Recent Labs Lab 10/28/12 1600 10/31/12 0225 10/31/12 0340  WBC 9.1  --  9.2  NEUTROABS  --   --  7.5  HGB 11.6* 9.9* 9.3*  HCT 33.2* 29.0* 26.4*  MCV 85.3  --  84.6  PLT 180  --  125*   Cardiac Enzymes:  Recent Labs Lab 10/31/12 0200  TROPONINI <0.30    BNP (last 3 results) No results found for this basename: PROBNP,  in the last 8760 hours CBG: No results found for this basename: GLUCAP,  in the last 168 hours  Radiological Exams on Admission: Dg Chest 2 View  10/31/2012   *RADIOLOGY REPORT*  Clinical Data: Chest pain and cough.  CHEST - 2 VIEW  Comparison: 10/28/2012.  Findings: Normal sized heart.  Increased patchy and linear opacity at the left lung base.  Moderate-sized left pleural effusion, increased.  Stable mild diffuse peribronchial thickening.  The heart remains grossly normal in size.  Positional scoliosis.  IMPRESSION:  1.  Increased left basilar pneumonia and atelectasis. 2.  Moderate-sized left pleural effusion, increased. 3.  Stable mild bronchitic changes.   Original Report Authenticated By: Claudie Revering, M.D.   Ct Angio Chest Pe W/cm &/or Wo Cm  10/31/2012   *RADIOLOGY REPORT*  Clinical Data: Recent diagnosis of pneumonia.  Pain in the extremities.  History of osteoarthritis.  CT ANGIOGRAPHY CHEST  Technique:  Multidetector CT imaging of the chest using the standard protocol during bolus administration of intravenous contrast. Multiplanar reconstructed images including MIPs were obtained and reviewed to evaluate the vascular anatomy.  Contrast: 163mL OMNIPAQUE IOHEXOL 350 MG/ML SOLN   Comparison: None.  Findings: Technically adequate study with moderately good opacification of the central and proximal segmental pulmonary arteries.  Distal segmental and peripheral vessels are not well opacified and poorly visualized.  No focal filling defect to suggest significant central pulmonary embolus.  Normal heart size.  Normal caliber thoracic aorta.  Scattered mediastinal lymph nodes are not pathologically enlarged.  Esophagus is decompressed.  Visualized portions of the upper abdominal organs are unremarkable.  Thyroid gland is homogeneous.  There is a small to moderate-sized left pleural effusion with consolidation demonstrated in the left lower lung and lingula and focal patchy areas of consolidation in the right lung base and throughout the remainder the right lung.  Changes likely represent multifocal pneumonia.  Airways appear patent.  No pneumothorax. Emphysematous changes in the lung apices.  Degenerative changes in the thoracic spine.  IMPRESSION: No evidence of central pulmonary embolus although the contrast bolus is not sufficient to exclude emboli in the peripheral vessels.  Left pleural effusion with consolidation in the left lung base.  Patchy areas of infiltration in the right lung. Changes are likely represent multifocal pneumonia.   Original Report Authenticated By: Lucienne Capers, M.D.     EKG: Independently reviewed.  Sinus Tachycardia at rate of 103, No acute S-T changes, and Diffuse Artifact Present   Assessment/Plan Principal Problem:   HCAP (healthcare-associated pneumonia) Active Problems:   BIPOLAR DISORDER UNSPECIFIED   DEPRESSION   HYPERTENSION   HYPOGONADISM   TOBACCO USER   Diabetes type 2, controlled   Anemia   Thrombocytopenia  1.   HCAP- IV Vanc,  And Zosyn, Nebulizer Rx, and O2 PRN.     2.   Anemia-   Send Anemia panel.  Hb 9.3 and  previously 11.6 on 07/10.   Monitor.     3.   HTN- Monitor BPs,   4.   DM2- SSI coverage PRN, and check  HbA1C,  5.   Hypogonadism- continue Androgen Gel daily.    6.   Thrombocytopenia-  Differential includes Sepsis, or psychotropic Medications or idiopathic.   Monitor trend.     6.   Depression- continue   7.  Bipolar- Continue   8.  DVT prophylaxis with SCDs.  Code Status: FULL CODE    Family Communication:    Family at bedside Disposition Plan:       Return to Home on Discharge  Time spent: [facility] Pager [phone]  If 7PM-7AM, please contact night-coverage [email or web address] Password [facility] 10/31/2012, 5:06 AM

## 2012-10-31 NOTE — Progress Notes (Signed)
[facility] PROGRESS NOTE  [name] [id number] DOB: [birth year] DOA: 10/31/2012 PCP: Jonathan Kehr, MD  Assessment/Plan: 1. HCAP- Fail outpatient therapy with Levaquin. Continue with IV Vanc, And Zosyn, Nebulizer Rx. Check HIV. Will order sputum culture, strep, legionella antigen. CT angio negative for PE.   2. Anemia-  Anemia panel. Check guaiac stool.  3. HTN- Monitor BPs,  4. DM2- SSI coverage PRN, and check HbA1C,  5. Hypogonadism- continue Androgen Gel daily.  6. Thrombocytopenia- Differential includes Sepsis, or psychotropic Medications or idiopathic. Monitor trend.  6. Depression//Bipolar- continue with currents medications.  7. DVT prophylaxis: Lovenox.    Code Status: Full  Family Communication: Care discussed with patient.  Disposition Plan: Home when stable.    Consultants:  none  Procedures:  none  Antibiotics:  Vancomycin 7-13  Zosyn 7-13  HPI/Subjective: Still with chest pain when he cough and deep breath. Breathing ok,. No others complaints.   Objective: Filed Vitals:   10/31/12 0123 10/31/12 0310 10/31/12 0518 10/31/12 0812  BP:  113/57 135/76   Pulse:  99 99   Temp:  100 F (37.8 C) 98.7 F (37.1 C)   TempSrc:  Oral Oral   Resp:  20 24   Height:   6\' 5"  (1.956 m)   Weight:   103.42 kg (228 lb)   SpO2: 100% 96% 98% 88%    Intake/Output Summary (Last 24 hours) at 10/31/12 1215 Last data filed at 10/31/12 0800  Gross per 24 hour  Intake    610 ml  Output      0 ml  Net    610 ml   Filed Weights   10/31/12 0518  Weight: 103.42 kg (228 lb)    Exam:   General:  No distress.   Cardiovascular: S 1, S 2 RRR  Respiratory: Bilateral crackles ronchus.   Abdomen: BS present, soft, NT  Musculoskeletal: no edema.   Data Reviewed: Basic Metabolic Panel:  Recent Labs Lab 10/28/12 1600 10/31/12 0200 10/31/12 0225 10/31/12 0838  NA 139 139 143 139  K 3.7 4.3 4.4 4.2  CL 104 103 105 105  CO2 25 26  --  26  GLUCOSE  187* 209* 213* 182*  BUN 19 20 22 16   CREATININE 0.97 1.02 1.10 0.96  CALCIUM 9.4 9.6  --  8.7   Liver Function Tests:  Recent Labs Lab 10/28/12 1600 10/31/12 0200  AST 13 23  ALT 15 23  ALKPHOS 87 92  BILITOT 0.3 0.4  PROT 7.2 8.1  ALBUMIN 3.2* 3.4*   No results found for this basename: LIPASE, AMYLASE,  in the last 168 hours No results found for this basename: AMMONIA,  in the last 168 hours CBC:  Recent Labs Lab 10/28/12 1600 10/31/12 0225 10/31/12 0340  WBC 9.1  --  9.2  NEUTROABS  --   --  7.5  HGB 11.6* 9.9* 9.3*  HCT 33.2* 29.0* 26.4*  MCV 85.3  --  84.6  PLT 180  --  125*   Cardiac Enzymes:  Recent Labs Lab 10/31/12 0200  TROPONINI <0.30   BNP (last 3 results) No results found for this basename: PROBNP,  in the last 8760 hours CBG: No results found for this basename: GLUCAP,  in the last 168 hours  Recent Results (from the past 240 hour(s))  CULTURE, EXPECTORATED SPUTUM-ASSESSMENT     Status: None   Collection Time    10/31/12 10:02 AM      Result Value Range Status  Specimen Description SPU   Final   Special Requests Normal   Final   Sputum evaluation     Final   Value: THIS SPECIMEN IS ACCEPTABLE. RESPIRATORY CULTURE REPORT TO FOLLOW.   Report Status 10/31/2012 FINAL   Final     Studies: Dg Chest 2 View  10/31/2012   *RADIOLOGY REPORT*  Clinical Data: Chest pain and cough.  CHEST - 2 VIEW  Comparison: 10/28/2012.  Findings: Normal sized heart.  Increased patchy and linear opacity at the left lung base.  Moderate-sized left pleural effusion, increased.  Stable mild diffuse peribronchial thickening.  The heart remains grossly normal in size.  Positional scoliosis.  IMPRESSION:  1.  Increased left basilar pneumonia and atelectasis. 2.  Moderate-sized left pleural effusion, increased. 3.  Stable mild bronchitic changes.   Original Report Authenticated By: Claudie Revering, M.D.   Ct Angio Chest Pe W/cm &/or Wo Cm  10/31/2012   *RADIOLOGY REPORT*   Clinical Data: Recent diagnosis of pneumonia.  Pain in the extremities.  History of osteoarthritis.  CT ANGIOGRAPHY CHEST  Technique:  Multidetector CT imaging of the chest using the standard protocol during bolus administration of intravenous contrast. Multiplanar reconstructed images including MIPs were obtained and reviewed to evaluate the vascular anatomy.  Contrast: 15mL OMNIPAQUE IOHEXOL 350 MG/ML SOLN  Comparison: None.  Findings: Technically adequate study with moderately good opacification of the central and proximal segmental pulmonary arteries.  Distal segmental and peripheral vessels are not well opacified and poorly visualized.  No focal filling defect to suggest significant central pulmonary embolus.  Normal heart size.  Normal caliber thoracic aorta.  Scattered mediastinal lymph nodes are not pathologically enlarged.  Esophagus is decompressed.  Visualized portions of the upper abdominal organs are unremarkable.  Thyroid gland is homogeneous.  There is a small to moderate-sized left pleural effusion with consolidation demonstrated in the left lower lung and lingula and focal patchy areas of consolidation in the right lung base and throughout the remainder the right lung.  Changes likely represent multifocal pneumonia.  Airways appear patent.  No pneumothorax. Emphysematous changes in the lung apices.  Degenerative changes in the thoracic spine.  IMPRESSION: No evidence of central pulmonary embolus although the contrast bolus is not sufficient to exclude emboli in the peripheral vessels.  Left pleural effusion with consolidation in the left lung base.  Patchy areas of infiltration in the right lung. Changes are likely represent multifocal pneumonia.   Original Report Authenticated By: Lucienne Capers, M.D.    Scheduled Meds: . sodium chloride   Intravenous STAT  . albuterol  2.5 mg Nebulization Q6H  . buPROPion  150 mg Oral TID  . celecoxib  200 mg Oral BID  . cholecalciferol  2,000 Units Oral  q morning - 10a  . clonazePAM  2 mg Oral QID  . cyanocobalamin  500 mcg Oral q morning - 10a  . divalproex  500 mg Oral QHS  . enoxaparin (LOVENOX) injection  40 mg Subcutaneous Q24H  . lisdexamfetamine  20 mg Oral Q24H  . multivitamin with minerals  1 tablet Oral q morning - 10a  . nicotine  14 mg Transdermal Daily  . piperacillin-tazobactam (ZOSYN)  IV  3.375 g Intravenous Q8H  . QUEtiapine  400 mg Oral QHS  . sodium chloride  3 mL Intravenous Q12H  . testosterone  5 g Transdermal Daily  . vancomycin  1,000 mg Intravenous Q8H   Continuous Infusions: . sodium chloride 100 mL/hr at 10/31/12 [id number]    Principal  Problem:   HCAP (healthcare-associated pneumonia) Active Problems:   BIPOLAR DISORDER UNSPECIFIED   DEPRESSION   HYPERTENSION   HYPOGONADISM   TOBACCO USER   Diabetes type 2, controlled   Anemia    Time spent: 35 minutes.     Ferris Fielden  [facility] Pager [phone]. If 7PM-7AM, please contact night-coverage at [email or web address], password [facility] 10/31/2012, 12:15 PM  LOS: 0 days

## 2012-10-31 NOTE — Progress Notes (Signed)
ANTIBIOTIC CONSULT NOTE - INITIAL  Pharmacy Consult for vancomycin Indication: pneumonia  No Known Allergies  Patient Measurements: Height: 6\' 5"  (195.6 cm) Weight: 228 lb (103.42 kg) IBW/kg (Calculated) : 89.1 Adjusted Body Weight:   Vital Signs: Temp: 98.7 F (37.1 C) (07/13 0518) Temp src: Oral (07/13 0518) BP: 135/76 mmHg (07/13 0518) Pulse Rate: 99 (07/13 0518) Intake/Output from previous day:   Intake/Output from this shift:    Labs:  Recent Labs  10/28/12 1600 10/31/12 0200 10/31/12 0225 10/31/12 0340  WBC 9.1  --   --  9.2  HGB 11.6*  --  9.9* 9.3*  PLT 180  --   --  125*  CREATININE 0.97 1.02 1.10  --    Estimated Creatinine Clearance: 108 ml/min (by C-G formula based on Cr of 1.1). No results found for this basename: VANCOTROUGH, VANCOPEAK, VANCORANDOM, GENTTROUGH, GENTPEAK, GENTRANDOM, TOBRATROUGH, TOBRAPEAK, TOBRARND, AMIKACINPEAK, AMIKACINTROU, AMIKACIN,  in the last 72 hours   Microbiology: No results found for this or any previous visit (from the past 720 hour(s)).  Medical History: Past Medical History  Diagnosis Date  . Anxiety   . Hypertension   . Depression     bipolar [facility]  . Hypogonadism male   . ADD (attention deficit disorder)   . Microscopic hematuria     hereditary s/p Urology eval  . Bipolar 1 disorder   . Arthritis     right hip    Medications:  Anti-infectives   Start     Dose/Rate Route Frequency Ordered Stop   10/31/12 1400  piperacillin-tazobactam (ZOSYN) IVPB 3.375 g     3.375 g 12.5 mL/hr over 240 Minutes Intravenous 3 times per day 10/31/12 0446     10/31/12 1400  vancomycin (VANCOCIN) IVPB 1000 mg/200 mL premix     1,000 mg 200 mL/hr over 60 Minutes Intravenous Every 8 hours 10/31/12 0542     10/31/12 0200  piperacillin-tazobactam (ZOSYN) IVPB 3.375 g     3.375 g 100 mL/hr over 30 Minutes Intravenous  Once 10/31/12 0157 10/31/12 0259   10/31/12 0200  vancomycin (VANCOCIN) IVPB 1000 mg/200 mL premix      1,000 mg 200 mL/hr over 60 Minutes Intravenous  Once 10/31/12 0157 10/31/12 0429     Assessment: Patient with PNA. First dose of antibiotics already given in ED.    Goal of Therapy:  Vancomycin trough level 15-20 mcg/ml  Plan:  Measure antibiotic drug levels at steady state Follow up culture results Vancomycin 1gm iv q8hr  Nani Skillern [place] 10/31/2012,5:44 AM

## 2012-11-01 LAB — BASIC METABOLIC PANEL
Chloride: 100 mEq/L (ref 96–112)
Creatinine, Ser: 0.93 mg/dL (ref 0.50–1.35)
GFR calc Af Amer: >90 mL/min (ref >90)
Potassium: 4.2 mEq/L (ref 3.5–5.1)
Sodium: 133 mEq/L — ABNORMAL LOW (ref 135–145)

## 2012-11-01 LAB — CBC
MCV: 85.9 fL (ref 78.0–100.0)
Platelets: 203 10*3/uL (ref 150–400)
RDW: 12.6 % (ref 11.5–15.5)
WBC: 11.4 10*3/uL — ABNORMAL HIGH (ref 4.0–10.5)

## 2012-11-01 LAB — IRON AND TIBC
Iron: 20 ug/dL — ABNORMAL LOW (ref 42–135)
Saturation Ratios: 9 % — ABNORMAL LOW (ref 20–55)
UIBC: 199 ug/dL (ref 125–400)

## 2012-11-01 LAB — FOLATE: Folate: >20 ng/mL

## 2012-11-01 LAB — VANCOMYCIN, TROUGH: Vancomycin Tr: 10.6 ug/mL (ref 10.0–20.0)

## 2012-11-01 MED ORDER — ALBUTEROL SULFATE (5 MG/ML) 0.5% IN NEBU
2.5000 mg | INHALATION_SOLUTION | RESPIRATORY_TRACT | Status: DC | PRN
  Administered 2012-11-02: 2.5 mg via RESPIRATORY_TRACT
  Filled 2012-11-01 (×4): qty 0.5

## 2012-11-01 MED ORDER — ALBUTEROL SULFATE (5 MG/ML) 0.5% IN NEBU
2.5000 mg | INHALATION_SOLUTION | Freq: Three times a day (TID) | RESPIRATORY_TRACT | Status: DC
  Administered 2012-11-02 – 2012-11-08 (×19): 2.5 mg via RESPIRATORY_TRACT
  Filled 2012-11-01 (×17): qty 0.5

## 2012-11-01 MED ORDER — FERROUS SULFATE 325 (65 FE) MG PO TABS
325.0000 mg | ORAL_TABLET | Freq: Two times a day (BID) | ORAL | Status: DC
  Administered 2012-11-01 – 2012-11-08 (×13): 325 mg via ORAL
  Filled 2012-11-01 (×17): qty 1

## 2012-11-01 MED ORDER — VANCOMYCIN HCL 10 G IV SOLR
1250.0000 mg | Freq: Three times a day (TID) | INTRAVENOUS | Status: DC
  Administered 2012-11-01 – 2012-11-03 (×6): 1250 mg via INTRAVENOUS
  Filled 2012-11-01 (×7): qty 1250

## 2012-11-01 NOTE — Progress Notes (Signed)
[facility] PROGRESS NOTE  [name] [id number] DOB: Dec 18, 1965 DOA: 10/31/2012 PCP: [name], MD  Assessment/Plan: 1. HCAP- Fail outpatient therapy with Levaquin. Continue with IV Vanc, And Zosyn, Zithromax day 2. , Nebulizer Rx. HIV non reactive. sputum culture pending, strep negative, legionella antigen. CT angio negative for PE.   2. Anemia-  Iron at 20, ferritin 845. B 12 1283, folate more than 20. Iron deficiency anemia vs anemia of chronic diseases.  Check guaiac stool. He will need screening colonoscopy outpatient. Will give trial of iron supplement.  3. HTN- Monitor BPs,  4. DM2- SSI coverage PRN, and  Hb-A1C pending.   5. Hypogonadism- continue Androgen Gel daily.  6. Thrombocytopenia- Could be secondary to infection. Monitor.  6. Depression//Bipolar- continue with currents medications.  7. DVT prophylaxis: Lovenox.    Code Status: Full  Family Communication: Care discussed with patient.  Disposition Plan: Home when stable.    Consultants:  none  Procedures:  none  Antibiotics:  Vancomycin 7-13  Zosyn 7-13  HPI/Subjective: Feeling tired. No worsening SOB. Chest pain improved.   Objective: Filed Vitals:   10/31/12 1927 10/31/12 2213 11/01/12 0639 11/01/12 0752  BP:  120/67 137/60   Pulse: 87 86 100   Temp:  98.6 F (37 C) 99.2 F (37.3 C)   TempSrc:  Oral Oral   Resp: 18 16 18    Height:      Weight:      SpO2: 96% 96% 93% 93%    Intake/Output Summary (Last 24 hours) at 11/01/12 1112 Last data filed at 10/31/12 2200  Gross per 24 hour  Intake 1793.33 ml  Output      0 ml  Net 1793.33 ml   Filed Weights   10/31/12 0518  Weight: 103.42 kg (228 lb)    Exam:   General:  No distress.   Cardiovascular: S 1, S 2 RRR  Respiratory: Bilateral crackles ronchus.   Abdomen: BS present, soft, NT  Musculoskeletal: no edema.   Data Reviewed: Basic Metabolic Panel:  Recent Labs Lab 10/28/12 1600 10/31/12 0200  10/31/12 0225 10/31/12 0838  NA 139 139 143 139  K 3.7 4.3 4.4 4.2  CL 104 103 105 105  CO2 25 26  --  26  GLUCOSE 187* 209* 213* 182*  BUN 19 20 22 16   CREATININE 0.97 1.02 1.10 0.96  CALCIUM 9.4 9.6  --  8.7   Liver Function Tests:  Recent Labs Lab 10/28/12 1600 10/31/12 0200  AST 13 23  ALT 15 23  ALKPHOS 87 92  BILITOT 0.3 0.4  PROT 7.2 8.1  ALBUMIN 3.2* 3.4*   No results found for this basename: LIPASE, AMYLASE,  in the last 168 hours No results found for this basename: AMMONIA,  in the last 168 hours CBC:  Recent Labs Lab 10/28/12 1600 10/31/12 0225 10/31/12 0340  WBC 9.1  --  9.2  NEUTROABS  --   --  7.5  HGB 11.6* 9.9* 9.3*  HCT 33.2* 29.0* 26.4*  MCV 85.3  --  84.6  PLT 180  --  125*   Cardiac Enzymes:  Recent Labs Lab 10/31/12 0200  TROPONINI <0.30   BNP (last 3 results) No results found for this basename: PROBNP,  in the last 8760 hours CBG: No results found for this basename: GLUCAP,  in the last 168 hours  Recent Results (from the past 240 hour(s))  CULTURE, BLOOD (ROUTINE X 2)     Status: None   Collection Time  10/31/12  2:35 AM      Result Value Range Status   Specimen Description BLOOD LEFT ANTECUBITAL   Final   Special Requests BOTTLES DRAWN AEROBIC AND ANAEROBIC 5CC   Final   Culture  Setup Time 10/31/2012 15:14   Final   Culture     Final   Value:        BLOOD CULTURE RECEIVED NO GROWTH TO DATE CULTURE WILL BE HELD FOR 5 DAYS BEFORE ISSUING A FINAL NEGATIVE REPORT   Report Status PENDING   Incomplete  CULTURE, BLOOD (ROUTINE X 2)     Status: None   Collection Time    10/31/12  2:40 AM      Result Value Range Status   Specimen Description BLOOD RIGHT FOREARM   Final   Special Requests BOTTLES DRAWN AEROBIC AND ANAEROBIC 5CC   Final   Culture  Setup Time 10/31/2012 15:15   Final   Culture     Final   Value:        BLOOD CULTURE RECEIVED NO GROWTH TO DATE CULTURE WILL BE HELD FOR 5 DAYS BEFORE ISSUING A FINAL NEGATIVE REPORT    Report Status PENDING   Incomplete  CULTURE, RESPIRATORY (NON-EXPECTORATED)     Status: None   Collection Time    10/31/12 10:00 AM      Result Value Range Status   Specimen Description SPUTUM   Final   Special Requests NONE   Final   Gram Stain PENDING   Incomplete   Culture Culture reincubated for better growth   Final   Report Status PENDING   Incomplete  CULTURE, EXPECTORATED SPUTUM-ASSESSMENT     Status: None   Collection Time    10/31/12 10:02 AM      Result Value Range Status   Specimen Description SPU   Final   Special Requests Normal   Final   Sputum evaluation     Final   Value: THIS SPECIMEN IS ACCEPTABLE. RESPIRATORY CULTURE REPORT TO FOLLOW.   Report Status 10/31/2012 FINAL   Final     Studies: Dg Chest 2 View  10/31/2012   *RADIOLOGY REPORT*  Clinical Data: Chest pain and cough.  CHEST - 2 VIEW  Comparison: 10/28/2012.  Findings: Normal sized heart.  Increased patchy and linear opacity at the left lung base.  Moderate-sized left pleural effusion, increased.  Stable mild diffuse peribronchial thickening.  The heart remains grossly normal in size.  Positional scoliosis.  IMPRESSION:  1.  Increased left basilar pneumonia and atelectasis. 2.  Moderate-sized left pleural effusion, increased. 3.  Stable mild bronchitic changes.   Original Report Authenticated By: Claudie Revering, M.D.   Ct Angio Chest Pe W/cm &/or Wo Cm  10/31/2012   *RADIOLOGY REPORT*  Clinical Data: Recent diagnosis of pneumonia.  Pain in the extremities.  History of osteoarthritis.  CT ANGIOGRAPHY CHEST  Technique:  Multidetector CT imaging of the chest using the standard protocol during bolus administration of intravenous contrast. Multiplanar reconstructed images including MIPs were obtained and reviewed to evaluate the vascular anatomy.  Contrast: 141mL OMNIPAQUE IOHEXOL 350 MG/ML SOLN  Comparison: None.  Findings: Technically adequate study with moderately good opacification of the central and proximal  segmental pulmonary arteries.  Distal segmental and peripheral vessels are not well opacified and poorly visualized.  No focal filling defect to suggest significant central pulmonary embolus.  Normal heart size.  Normal caliber thoracic aorta.  Scattered mediastinal lymph nodes are not pathologically enlarged.  Esophagus is decompressed.  Visualized portions of the upper abdominal organs are unremarkable.  Thyroid gland is homogeneous.  There is a small to moderate-sized left pleural effusion with consolidation demonstrated in the left lower lung and lingula and focal patchy areas of consolidation in the right lung base and throughout the remainder the right lung.  Changes likely represent multifocal pneumonia.  Airways appear patent.  No pneumothorax. Emphysematous changes in the lung apices.  Degenerative changes in the thoracic spine.  IMPRESSION: No evidence of central pulmonary embolus although the contrast bolus is not sufficient to exclude emboli in the peripheral vessels.  Left pleural effusion with consolidation in the left lung base.  Patchy areas of infiltration in the right lung. Changes are likely represent multifocal pneumonia.   Original Report Authenticated By: Lucienne Capers, M.D.    Scheduled Meds: . albuterol  2.5 mg Nebulization Q6H  . azithromycin  500 mg Oral Daily  . buPROPion  150 mg Oral TID  . celecoxib  200 mg Oral BID  . cholecalciferol  2,000 Units Oral q morning - 10a  . clonazePAM  2 mg Oral QID  . cyanocobalamin  500 mcg Oral q morning - 10a  . divalproex  500 mg Oral QHS  . enoxaparin (LOVENOX) injection  40 mg Subcutaneous Q24H  . lisdexamfetamine  20 mg Oral Q24H  . multivitamin with minerals  1 tablet Oral q morning - 10a  . nicotine  14 mg Transdermal Daily  . piperacillin-tazobactam (ZOSYN)  IV  3.375 g Intravenous Q8H  . QUEtiapine  400 mg Oral QHS  . sodium chloride  3 mL Intravenous Q12H  . testosterone  5 g Transdermal Daily  . vancomycin  1,000 mg  Intravenous Q8H   Continuous Infusions: . sodium chloride 100 mL/hr at 10/31/12 1217    Principal Problem:   HCAP (healthcare-associated pneumonia) Active Problems:   BIPOLAR DISORDER UNSPECIFIED   DEPRESSION   HYPERTENSION   HYPOGONADISM   TOBACCO USER   Diabetes type 2, controlled   Anemia    Time spent: 25 minutes.     Angelo Prindle  [facility] Pager [phone]. If 7PM-7AM, please contact night-coverage at [email or web address], password [facility] 11/01/2012, 11:12 AM  LOS: 1 day

## 2012-11-01 NOTE — Progress Notes (Signed)
Brief Pharmacy Consult Note: Vancomycin  See progress note from Gretta Arab, PharmD from 11:37 today for full details.  Vancomycin trough = 10.6 on 1g IV q8h which is < goal 15-20 mcg/ml  Plan:  Increase Vancomycin to 1250mg  IV q8h  Recheck trough at steady state if continues  Peggyann Juba, PharmD, BCPS Pager: [phone] 11/01/2012 7:48 PM

## 2012-11-01 NOTE — Progress Notes (Signed)
ANTIBIOTIC CONSULT NOTE - FOLLOW UP  Pharmacy Consult for Vancomycin Indication: rule out pneumonia  No Known Allergies  Patient Measurements: Height: 6\' 5"  (195.6 cm) Weight: 228 lb (103.42 kg) IBW/kg (Calculated) : 89.1  Vital Signs: Temp: 99.2 F (37.3 C) (07/14 0639) Temp src: Oral (07/14 0639) BP: 137/60 mmHg (07/14 0639) Pulse Rate: 100 (07/14 0639) Intake/Output from previous day: 07/13 0701 - 07/14 0700 In: 2593.3 [P.O.:1080; I.V.:1263.3; IV Piggyback:250] Out: -   Labs:  Recent Labs  10/31/12 0200 10/31/12 0225 10/31/12 0340 10/31/12 0838  WBC  --   --  9.2  --   HGB  --  9.9* 9.3*  --   PLT  --   --  125*  --   CREATININE 1.02 1.10  --  0.96   Estimated Creatinine Clearance: 123.8 ml/min (by C-G formula based on Cr of 0.96). No results found for this basename: VANCOTROUGH, Corlis Leak, VANCORANDOM, GENTTROUGH, GENTPEAK, GENTRANDOM, TOBRATROUGH, TOBRAPEAK, TOBRARND, AMIKACINPEAK, AMIKACINTROU, AMIKACIN,  in the last 72 hours   Anti-infectives   Start     Dose/Rate Route Frequency Ordered Stop   10/31/12 1400  piperacillin-tazobactam (ZOSYN) IVPB 3.375 g     3.375 g 12.5 mL/hr over 240 Minutes Intravenous 3 times per day 10/31/12 0446     10/31/12 1400  azithromycin (ZITHROMAX) tablet 500 mg     500 mg Oral Daily 10/31/12 1224     10/31/12 1200  vancomycin (VANCOCIN) IVPB 1000 mg/200 mL premix     1,000 mg 200 mL/hr over 60 Minutes Intravenous Every 8 hours 10/31/12 0542     10/31/12 0200  piperacillin-tazobactam (ZOSYN) IVPB 3.375 g     3.375 g 100 mL/hr over 30 Minutes Intravenous  Once 10/31/12 0157 10/31/12 0259   10/31/12 0200  vancomycin (VANCOCIN) IVPB 1000 mg/200 mL premix     1,000 mg 200 mL/hr over 60 Minutes Intravenous  Once 10/31/12 0157 10/31/12 0429      Assessment: 47 yo M admit on 7/13 from home with suspected pneumonia.  He had been started on PO Levaquin as an outpatient, but returned to ED with worsening SOB and cough 2 days later.   Pharmacy was asked to dose IV vancomycin.    Day #2 Vanc, and Zosyn/Azith (per MD)  SCr stable with CrCl > 100 ml/min  Vancomycin trough level before tonight's dose.  Goal of Therapy:  Vancomycin trough level 15-20 mcg/ml  Plan:   Continue Vancomycin 1g IV q8h.  Measure Vanc trough at steady state.  Follow up renal fxn and culture results.   Gretta Arab PharmD, BCPS Pager [phone] 11/01/2012 11:38 AM

## 2012-11-01 NOTE — Care Management Note (Signed)
    Page 1 of 1   11/01/2012     1:39:30 PM   CARE MANAGEMENT NOTE 11/01/2012  Patient:  [name], [name]   Account Number:  [id number]  Date Initiated:  11/01/2012  Documentation initiated by:  Dessa Phi  Subjective/Objective Assessment:   ADMITTED W/PNA.     Action/Plan:   FROM HOME.NOT WORKING.NO PCP.   Anticipated DC Date:  11/05/2012   Anticipated DC Plan:  HOME/SELF CARE  In-house referral  [facility]      Choice offered to / List presented to:             Status of service:  In process, will continue to follow Medicare Important Message given?   (If response is "NO", the following Medicare IM given date fields will be blank) Date Medicare IM given:   Date Additional Medicare IM given:    Discharge Disposition:    Per UR Regulation:  Reviewed for med. necessity/level of care/duration of stay  If discussed at [place] of Stay Meetings, dates discussed:    Comments:  11/01/12 Nur Rabold RN,BSN NCM [place] W/PCP LISTING.APPT SET FOR7/25/14-[facility](SEE IN D/C/F/U SECTION),PROVIDEDW/COMMUNITY RESOURCES/HEALTH INSURANCE INFO/$4[employer] MED LIST(STATES HE CAN AFFORD IF ON [employer] LIST).

## 2012-11-02 ENCOUNTER — Inpatient Hospital Stay (HOSPITAL_COMMUNITY): Payer: MEDICAID

## 2012-11-02 DIAGNOSIS — R0902 Hypoxemia: Secondary | ICD-10-CM

## 2012-11-02 DIAGNOSIS — J90 Pleural effusion, not elsewhere classified: Secondary | ICD-10-CM

## 2012-11-02 DIAGNOSIS — J189 Pneumonia, unspecified organism: Secondary | ICD-10-CM

## 2012-11-02 DIAGNOSIS — J9600 Acute respiratory failure, unspecified whether with hypoxia or hypercapnia: Secondary | ICD-10-CM

## 2012-11-02 HISTORY — DX: Pneumonia, unspecified organism: J18.9

## 2012-11-02 HISTORY — DX: Pleural effusion, not elsewhere classified: J90

## 2012-11-02 LAB — CBC
Hemoglobin: 10.9 g/dL — ABNORMAL LOW (ref 13.0–17.0)
MCHC: 34.3 g/dL (ref 30.0–36.0)

## 2012-11-02 LAB — PROTEIN, TOTAL: Total Protein: 6.3 g/dL (ref 6.0–8.3)

## 2012-11-02 LAB — BLOOD GAS, ARTERIAL
Drawn by: 331471
Patient temperature: 37
pH, Arterial: 7.407 (ref 7.350–7.450)

## 2012-11-02 LAB — BODY FLUID CELL COUNT WITH DIFFERENTIAL
Lymphs, Fluid: 19 %
Neutrophil Count, Fluid: 73 % — ABNORMAL HIGH (ref 0–25)

## 2012-11-02 LAB — BASIC METABOLIC PANEL
GFR calc Af Amer: >90 mL/min (ref >90)
GFR calc non Af Amer: 84 mL/min — ABNORMAL LOW (ref >90)
Glucose, Bld: 170 mg/dL — ABNORMAL HIGH (ref 70–99)
Potassium: 4.2 mEq/L (ref 3.5–5.1)
Sodium: 141 mEq/L (ref 135–145)

## 2012-11-02 LAB — LEGIONELLA ANTIGEN, URINE: Legionella Antigen, Urine: NEGATIVE

## 2012-11-02 LAB — CULTURE, RESPIRATORY W GRAM STAIN

## 2012-11-02 MED ORDER — MORPHINE SULFATE 2 MG/ML IJ SOLN
INTRAMUSCULAR | Status: AC
  Filled 2012-11-02: qty 1

## 2012-11-02 MED ORDER — MORPHINE SULFATE 2 MG/ML IJ SOLN
2.0000 mg | INTRAMUSCULAR | Status: DC | PRN
  Administered 2012-11-02 – 2012-11-06 (×23): 2 mg via INTRAVENOUS
  Filled 2012-11-02 (×22): qty 1

## 2012-11-02 NOTE — Consult Note (Signed)
PULMONARY  / CRITICAL CARE MEDICINE  Name: [name] MRN: [id number] DOB: 02-Nov-1965    ADMISSION DATE:  10/31/2012 CONSULTATION DATE:  11/02/12  REFERRING MD :  Dr. Tyrell Antonio PRIMARY SERVICE: [facility]-->PCCM  CHIEF COMPLAINT:  Dyspnea  BRIEF PATIENT DESCRIPTION: 47 y/o WM admitted with LLL PNA.  Decompensated 7/15 with progressive dyspnea & hypoxemia.   SIGNIFICANT EVENTS / STUDIES:  7/13 - Admit with LLL PNA, dyspnea, cough, pleuritic chest pain, fevers 7/15 - Decompensated on floor, tx to ICU, hypoxemia with sats in 50's on [place], L Thora with 1.4 L removed  LINES / TUBES:   CULTURES: 7/13 Sputum >>> rare streptococcus>>> 7/13 BCx2 >>> 7/15 Pleural Fluid>>>   ANTIBIOTICS: Azithro 7/13 >>> Zosyn 7/13>>> Vanco 7/13>>>  HISTORY OF PRESENT ILLNESS:   47 y/o WM with PMH of Anxiety, Depression, ADD, Bipolar Disorder, Hereditary Microscopic Hematuria, Hypogonadism, Lumbar disc surgery who presented to [facility] ER on 7/13 with progressive cough with yellow sputum production, and pleuritic chest pain.  Had been seen in ER 2 days prior for similar symptoms and treated with Levaquin.  CT of chest performed to evaluate for PE (negative) which demonstrated LLL consolidation, L moderate effusion and patchy areas of infiltrate in R lung.  Initial work up demonstrated WBC of 9.2 with 82% neutrophils, thrombocytopenia, lactic acid of 1.55, negative strep pneumo antigen.  Admitted per [facility] and placed on vanco, zosyn, azithro.  Sputum culture with rare streptococcus (beta hemolytic, not group A), negative HIV.  After receiving dilaudid on floor for chest pain, he had worsening hypoxemia with sats in 50's and was transferred to ICU for observation.     PAST MEDICAL HISTORY :  Past Medical History  Diagnosis Date  . Anxiety   . Hypertension   . Depression     bipolar [facility]  . Hypogonadism male   . ADD (attention deficit disorder)   . Microscopic hematuria     hereditary s/p Urology eval  .  Bipolar 1 disorder   . Arthritis     right hip   Past Surgical History  Procedure Laterality Date  . Lumbar disc surgery    . Back surgery     Prior to Admission medications   Medication Sig Start Date End Date Taking? Authorizing Provider  buPROPion (WELLBUTRIN SR) 150 MG 12 hr tablet Take 150 mg by mouth 3 (three) times daily.    Yes Historical Provider, MD  celecoxib (CELEBREX) 200 MG capsule Take 200 mg by mouth 2 (two) times daily.   Yes Historical Provider, MD  cholecalciferol (VITAMIN D) 1000 UNITS tablet Take 2,000 Units by mouth every morning.    Yes Historical Provider, MD  clonazePAM (KLONOPIN) 2 MG tablet Take 1 tablet (2 mg total) by mouth 4 (four) times daily. 08/09/12  Yes Evie Lacks Plotnikov, MD  cyanocobalamin 500 MCG tablet Take 500 mcg by mouth every morning.    Yes Historical Provider, MD  divalproex (DEPAKOTE ER) 250 MG 24 hr tablet Take 500 mg by mouth at bedtime.    Yes Historical Provider, MD  Glucos-MSM-C-Mn-Ginger-Willow (GLUCOSAMINE MSM COMPLEX PO) Take 2 capsules by mouth 2 (two) times daily.    Yes Historical Provider, MD  HYDROcodone-acetaminophen (NORCO) 10-325 MG per tablet Take 0.5-1 tablets by mouth every 8 (eight) hours as needed for pain. 10/28/12  Yes Carmin Muskrat, MD  ibuprofen (ADVIL,MOTRIN) 200 MG tablet Take 600 mg by mouth every 8 (eight) hours as needed for pain.    Yes Historical Provider, MD  levofloxacin (LEVAQUIN) 500 MG tablet Take 1 tablet (500 mg total) by mouth daily. 10/29/12 11/03/12 Yes Carmin Muskrat, MD  lisdexamfetamine (VYVANSE) 20 MG capsule Take 20 mg by mouth every morning.   Yes Historical Provider, MD  Multiple Vitamin (MULTIVITAMIN WITH MINERALS) TABS Take 1 tablet by mouth every morning.   Yes Historical Provider, MD  QUEtiapine (SEROQUEL XR) 200 MG 24 hr tablet Take 400 mg by mouth at bedtime.    Yes Historical Provider, MD  Testosterone (ANDROGEL) 20.25 MG/1.25GM (1.62%) GEL Place 3 application onto the skin daily. 04/02/12   Yes Evie Lacks Plotnikov, MD  tadalafil (CIALIS) 5 MG tablet Take 1 tablet (5 mg total) by mouth daily as needed. 08/09/12   [name], MD   No Known Allergies  FAMILY HISTORY:  Family History  Problem Relation Age of Onset  . Diabetes Father   . Cancer Mother     died of melanoma with mets  . Cervical cancer Sister   . Diabetes Sister    SOCIAL HISTORY:  reports that he has been smoking Cigarettes.  He has a 12.5 pack-year smoking history. He has never used smokeless tobacco. He reports that he does not drink alcohol or use illicit drugs.  REVIEW OF SYSTEMS:  Unable to complete due to dyspnea.    SUBJECTIVE:  Nods yes to shortness of breath.  Denies pain, n/v/d.   VITAL SIGNS: Temp:  [98.3 F (36.8 C)-99.6 F (37.6 C)] 99.6 F (37.6 C) (07/15 1006) Pulse Rate:  [86-112] 112 (07/15 1100) Resp:  [17-28] 22 (07/15 1100) BP: (123-136)/(56-65) 123/65 mmHg (07/15 1100) SpO2:  [90 %-96 %] 90 % (07/15 1100) FiO2 (%):  [100 %] 100 % (07/15 1006)  HEMODYNAMICS:    VENTILATOR SETTINGS: Vent Mode:  [-]  FiO2 (%):  [100 %] 100 %  INTAKE / OUTPUT: Intake/Output     07/14 0701 - 07/15 0700 07/15 0701 - 07/16 0700   P.O.     I.V. (mL/kg) 2400 (23.2)    IV Piggyback 600    Total Intake(mL/kg) 3000 (29)    Urine (mL/kg/hr) 2025 (0.8)    Total Output 2025     Net +975          Urine Occurrence 1 x      PHYSICAL EXAMINATION: General:  Young adult male with increased WOB Neuro:  AAOx4, speech clear, MAE HEENT:  Mm pink/dry, no jvd Cardiovascular:  s1s2 rrr, no m/r/g Lungs:  resp's shallow, mildly labored, lungs bilaterally diminished, LL posterior crackles Abdomen:  Round/soft, bsx4 active Musculoskeletal:  No acute deformities Skin:  LE's with multiple scars, no edema  LABS:  Recent Labs Lab 10/28/12 1600 10/31/12 0200  10/31/12 0226 10/31/12 0340 10/31/12 0838 11/01/12 1138 11/02/12 0505 11/02/12 1035  HGB 11.6*  --   < >  --  9.3*  --  11.3* 10.9*  --    WBC 9.1  --   --   --  9.2  --  11.4* 10.6*  --   PLT 180  --   --   --  125*  --  203 216  --   NA 139 139  < >  --   --  139 133* 141  --   K 3.7 4.3  < >  --   --  4.2 4.2 4.2  --   CL 104 103  < >  --   --  105 100 104  --   CO2 25 26  --   --   --  26 26 29   --   GLUCOSE 187* 209*  < >  --   --  182* 315* 170*  --   BUN 19 20  < >  --   --  16 12 12   --   CREATININE 0.97 1.02  < >  --   --  0.96 0.93 1.06  --   CALCIUM 9.4 9.6  --   --   --  8.7 8.7 8.7  --   AST 13 23  --   --   --   --   --   --   --   ALT 15 23  --   --   --   --   --   --   --   ALKPHOS 87 92  --   --   --   --   --   --   --   BILITOT 0.3 0.4  --   --   --   --   --   --   --   PROT 7.2 8.1  --   --   --   --   --   --   --   ALBUMIN 3.2* 3.4*  --   --   --   --   --   --   --   INR 0.99  --   --   --   --   --   --   --   --   LATICACIDVEN  --   --   --  1.55  --   --   --   --   --   TROPONINI  --  <0.30  --   --   --   --   --   --   --   PHART  --   --   --   --   --   --   --   --  7.407  PCO2ART  --   --   --   --   --   --   --   --  39.1  PO2ART  --   --   --   --   --   --   --   --  64.5*  < > = values in this interval not displayed. No results found for this basename: GLUCAP,  in the last 168 hours  CXR: 7/15 - moderate L pleural effusion, patchy airspace disease in R  ASSESSMENT / PLAN:  PULMONARY A: L CAP - rare streptococcus in sputum culture L Pleural Effusion - s/p thoracentesis 7/15 with 1.5 L removed Pulmonary Edema   Hypoxemia - Pa/FiO2 ratio 64 upon transfer to ICU  P:   -follow cxr -oxygen to keep sats > 92% -monitor for clinical decompensation, may require intubation -see ID section  -L Thora now -KVO IVF -pulmonary hygiene -NPO x meds / sips -assess pleural fluid   CARDIOVASCULAR A:  Sinus Tachycardia - in setting of PNA P:  -supportive care -KVO IVF with pulmonary edema  RENAL A:   At risk ATN / PreRenal fx - in setting of CAP P:   -monitor renal  function   GASTROINTESTINAL A:   NPO P:   -hold diet with resp status -NPO x meds   HEMATOLOGIC A:   Anemia - normocytic, IDA vs ACD Thrombocytopenia - in setting of acute illness  P:  -follow H/H, no evidence of acute blood loss -will  need further work up as outpatient -Iron once ok for PO's  INFECTIOUS A:   CAP - rx'd for 2 days with levaquin as outpt prior to admit  P:   -would not consider levaquin failure -continue current abx until sputum culture finalized to r/o atypical's -assess lactic acid, PCT now  ENDOCRINE A:   Hyperglycemia Hypogonadism   P:   -continue androgen gel -continue SSI -A1C pending   NEUROLOGIC A:   Pain - secondary to CAP Depression / Bipolar Disorder   P:   -caution with PRN Narc's -continue current PSY regimen  I have personally obtained a history, examined the patient, evaluated laboratory and imaging results, formulated the assessment and plan and placed orders.  CRITICAL CARE: The patient is critically ill with multiple organ systems failure and requires high complexity decision making for assessment and support, frequent evaluation and titration of therapies, application of advanced monitoring technologies and extensive interpretation of multiple databases. Critical Care Time devoted to patient care services described in this note is 45 minutes.   Rush Farmer, M.D. [facility] Pulmonary/Critical Care Medicine. Pager: [phone]. After hours pager: [phone].  11/02/2012, 11:22 AM

## 2012-11-02 NOTE — Procedures (Signed)
Thoracentesis Procedure Note  Pre-operative Diagnosis: Left sided pleural effusion.  Post-operative Diagnosis: same  Indications: Left sided pleural effusion.  Procedure Details  Consent: Informed consent was obtained. Risks of the procedure were discussed including: infection, bleeding, pain, pneumothorax.  Under sterile conditions the patient was positioned. Betadine solution and sterile drapes were utilized.  1% buffered lidocaine was used to anesthetize the 6th rib space. Fluid was obtained without any difficulties and minimal blood loss.  A dressing was applied to the wound and wound care instructions were provided.   Findings 1350 ml of cloudy pleural fluid was obtained. A sample was sent to Pathology for cytogenetics, flow, and cell counts, as well as for infection analysis.  Complications:  None; patient tolerated the procedure well.          Condition: stable  Plan A follow up chest x-ray was ordered. Bed Rest for 1 hours. Tylenol 650 mg. for pain.  Attending Attestation: I performed the procedure.  U/S used in performance of procedures.  Rush Farmer, M.D. [facility] Pulmonary/Critical Care Medicine. Pager: [phone]. After hours pager: [phone].

## 2012-11-02 NOTE — Progress Notes (Signed)
Rapid Response Event Note  Overview:    Rapid response called. When arrived MD, pts RN and charge floor RN at bedside. Put had a NRB 15 L oxygenating 92%. HR in 100's. Per pts RN had episode with O2 sats 53% at the lowest. Pt placed on NRB at that time. PCXR, ABG and breathing Tx given per MD orders. Pt A&OX4. Will consult PCCM and transfer to SDU     Interventions: See above   Event Summary:   at      at          [place], [place]

## 2012-11-02 NOTE — Progress Notes (Signed)
Report given to step down nurse Amy Roselie Awkward RN- Sandie Ano RN

## 2012-11-02 NOTE — Progress Notes (Signed)
Patient is complaining of pain all over body.  Pain especially in his back and left side.  He states that it is hard to reposition in bed and even harder to get up out of bed due to the pain.  Medicating patient with prn pain medications.  Will continue to monitor.

## 2012-11-02 NOTE — Progress Notes (Signed)
[facility] PROGRESS NOTE  [name] [id number] DOB: 1965-08-22 DOA: 10/31/2012 PCP: [name], MD  Assessment/Plan:  Acute hypoxic Respiratory Failure: This could be secondary to worsening PNA, sedatives. Will order stat chest x ray, ABG, transfer to ICU, CCM consulted.   HCAP- Fail outpatient therapy with Levaquin. Continue with IV Vanc, And Zosyn, Zithromax day 3. , Nebulizer Rx. HIV non reactive. sputum culture pending, strep negative, legionella antigen. CT angio negative for PE. Patient became hypoxic in the 50 range. He received one dose of dilaudid. He was started on NB, sat now in the 90 range. Stat ABG and chest x ray ordered. Patient will be transfer to ICU. CCM consulted.   Anemia-  Iron at 20, ferritin 845. B 12 1283, folate more than 20. Iron deficiency anemia vs anemia of chronic diseases.  Check guaiac stool. He will need screening colonoscopy outpatient. Will give trial of iron supplement.  HTN- Monitor BPs,  DM2- SSI coverage PRN, and  Hb-A1C pending.   Hypogonadism- continue Androgen Gel daily.  Thrombocytopenia- Could be secondary to infection. Monitor.  Depression//Bipolar- continue with currents medications.  DVT prophylaxis: Lovenox.    Code Status: Full  Family Communication: Care discussed with patient.  Disposition Plan: Home when stable.    Consultants:  none  Procedures:  none  Antibiotics:  Vancomycin 7-13  Zosyn 7-13  HPI/Subjective: Patient sedated, open eyes to answer questions. Denies worsening SOB, complaining of chest pain and shoulder pain.   Objective: Filed Vitals:   11/01/12 2132 11/02/12 0635 11/02/12 0751 11/02/12 1006  BP: 126/57 136/60  127/56  Pulse: 99 102  111  Temp: 98.3 F (36.8 C) 99.1 F (37.3 C)  99.6 F (37.6 C)  TempSrc: Oral Oral  Oral  Resp: 28 24  21   Height:      Weight:      SpO2: 93% 94% 92% 96%    Intake/Output Summary (Last 24 hours) at 11/02/12 1040 Last data filed at 11/02/12  0700  Gross per 24 hour  Intake   3000 ml  Output   2025 ml  Net    975 ml   Filed Weights   10/31/12 0518  Weight: 103.42 kg (228 lb)    Exam:   General:  No distress.   Cardiovascular: S 1, S 2 RRR  Respiratory: Bilateral crackles ronchus.   Abdomen: BS present, soft, NT  Musculoskeletal: no edema.   Data Reviewed: Basic Metabolic Panel:  Recent Labs Lab 10/28/12 1600 10/31/12 0200 10/31/12 0225 10/31/12 0838 11/01/12 1138 11/02/12 0505  NA 139 139 143 139 133* 141  K 3.7 4.3 4.4 4.2 4.2 4.2  CL 104 103 105 105 100 104  CO2 25 26  --  26 26 29   GLUCOSE 187* 209* 213* 182* 315* 170*  BUN 19 20 22 16 12 12   CREATININE 0.97 1.02 1.10 0.96 0.93 1.06  CALCIUM 9.4 9.6  --  8.7 8.7 8.7   Liver Function Tests:  Recent Labs Lab 10/28/12 1600 10/31/12 0200  AST 13 23  ALT 15 23  ALKPHOS 87 92  BILITOT 0.3 0.4  PROT 7.2 8.1  ALBUMIN 3.2* 3.4*   No results found for this basename: LIPASE, AMYLASE,  in the last 168 hours No results found for this basename: AMMONIA,  in the last 168 hours CBC:  Recent Labs Lab 10/28/12 1600 10/31/12 0225 10/31/12 0340 11/01/12 1138 11/02/12 0505  WBC 9.1  --  9.2 11.4* 10.6*  NEUTROABS  --   --  7.5  --   --   HGB 11.6* 9.9* 9.3* 11.3* 10.9*  HCT 33.2* 29.0* 26.4* 32.8* 31.8*  MCV 85.3  --  84.6 85.9 86.4  PLT 180  --  125* 203 216   Cardiac Enzymes:  Recent Labs Lab 10/31/12 0200  TROPONINI <0.30   BNP (last 3 results) No results found for this basename: PROBNP,  in the last 8760 hours CBG: No results found for this basename: GLUCAP,  in the last 168 hours  Recent Results (from the past 240 hour(s))  CULTURE, BLOOD (ROUTINE X 2)     Status: None   Collection Time    10/31/12  2:35 AM      Result Value Range Status   Specimen Description BLOOD LEFT ANTECUBITAL   Final   Special Requests BOTTLES DRAWN AEROBIC AND ANAEROBIC 5CC   Final   Culture  Setup Time 10/31/2012 15:14   Final   Culture     Final    Value:        BLOOD CULTURE RECEIVED NO GROWTH TO DATE CULTURE WILL BE HELD FOR 5 DAYS BEFORE ISSUING A FINAL NEGATIVE REPORT   Report Status PENDING   Incomplete  CULTURE, BLOOD (ROUTINE X 2)     Status: None   Collection Time    10/31/12  2:40 AM      Result Value Range Status   Specimen Description BLOOD RIGHT FOREARM   Final   Special Requests BOTTLES DRAWN AEROBIC AND ANAEROBIC 5CC   Final   Culture  Setup Time 10/31/2012 15:15   Final   Culture     Final   Value:        BLOOD CULTURE RECEIVED NO GROWTH TO DATE CULTURE WILL BE HELD FOR 5 DAYS BEFORE ISSUING A FINAL NEGATIVE REPORT   Report Status PENDING   Incomplete  CULTURE, RESPIRATORY (NON-EXPECTORATED)     Status: None   Collection Time    10/31/12 10:00 AM      Result Value Range Status   Specimen Description SPUTUM   Final   Special Requests NONE   Final   Gram Stain     Final   Value: RARE WBC PRESENT, PREDOMINANTLY PMN     NO SQUAMOUS EPITHELIAL CELLS SEEN     RARE GRAM NEGATIVE RODS     RARE YEAST   Culture RARE STREPTOCOCCUS,BETA HEMOLYIC NOT GROUP A   Final   Report Status 11/02/2012 FINAL   Final  CULTURE, EXPECTORATED SPUTUM-ASSESSMENT     Status: None   Collection Time    10/31/12 10:02 AM      Result Value Range Status   Specimen Description SPU   Final   Special Requests Normal   Final   Sputum evaluation     Final   Value: THIS SPECIMEN IS ACCEPTABLE. RESPIRATORY CULTURE REPORT TO FOLLOW.   Report Status 10/31/2012 FINAL   Final     Studies: No results found.  Scheduled Meds: . albuterol  2.5 mg Nebulization TID  . azithromycin  500 mg Oral Daily  . buPROPion  150 mg Oral TID  . celecoxib  200 mg Oral BID  . cholecalciferol  2,000 Units Oral q morning - 10a  . clonazePAM  2 mg Oral QID  . cyanocobalamin  500 mcg Oral q morning - 10a  . divalproex  500 mg Oral QHS  . enoxaparin (LOVENOX) injection  40 mg Subcutaneous Q24H  . ferrous sulfate  325 mg Oral BID WC  .  lisdexamfetamine  20 mg Oral  Q24H  . multivitamin with minerals  1 tablet Oral q morning - 10a  . nicotine  14 mg Transdermal Daily  . piperacillin-tazobactam (ZOSYN)  IV  3.375 g Intravenous Q8H  . QUEtiapine  400 mg Oral QHS  . sodium chloride  3 mL Intravenous Q12H  . testosterone  5 g Transdermal Daily  . vancomycin  1,250 mg Intravenous Q8H   Continuous Infusions: . sodium chloride 100 mL/hr at 11/02/12 0445    Principal Problem:   HCAP (healthcare-associated pneumonia) Active Problems:   BIPOLAR DISORDER UNSPECIFIED   DEPRESSION   HYPERTENSION   HYPOGONADISM   TOBACCO USER   Diabetes type 2, controlled   Anemia    Time spent: 35 minutes.     Preslyn Warr  [facility] Pager [phone]. If 7PM-7AM, please contact night-coverage at [email or web address], password [place] 11/02/2012, 10:40 AM  LOS: 2 days

## 2012-11-03 ENCOUNTER — Inpatient Hospital Stay (HOSPITAL_COMMUNITY): Payer: MEDICAID

## 2012-11-03 LAB — CBC
MCHC: 34.4 g/dL (ref 30.0–36.0)
MCV: 86.2 fL (ref 78.0–100.0)
Platelets: 205 10*3/uL (ref 150–400)
RDW: 12.8 % (ref 11.5–15.5)
WBC: 9.6 10*3/uL (ref 4.0–10.5)

## 2012-11-03 LAB — BASIC METABOLIC PANEL
Calcium: 8.8 mg/dL (ref 8.4–10.5)
Creatinine, Ser: 1.02 mg/dL (ref 0.50–1.35)
GFR calc non Af Amer: 88 mL/min — ABNORMAL LOW (ref >90)
Sodium: 140 mEq/L (ref 135–145)

## 2012-11-03 LAB — CHOLESTEROL, BODY FLUID

## 2012-11-03 LAB — MAGNESIUM: Magnesium: 2.1 mg/dL (ref 1.5–2.5)

## 2012-11-03 MED ORDER — FUROSEMIDE 10 MG/ML IJ SOLN
40.0000 mg | Freq: Once | INTRAMUSCULAR | Status: AC
  Administered 2012-11-03: 40 mg via INTRAVENOUS
  Filled 2012-11-03: qty 4

## 2012-11-03 MED ORDER — CLONAZEPAM 0.5 MG PO TABS
0.5000 mg | ORAL_TABLET | Freq: Two times a day (BID) | ORAL | Status: DC
  Administered 2012-11-03 (×2): 0.5 mg via ORAL
  Filled 2012-11-03 (×3): qty 1

## 2012-11-03 MED ORDER — VANCOMYCIN HCL IN DEXTROSE 1-5 GM/200ML-% IV SOLN
1000.0000 mg | Freq: Three times a day (TID) | INTRAVENOUS | Status: DC
  Administered 2012-11-03 – 2012-11-06 (×8): 1000 mg via INTRAVENOUS
  Filled 2012-11-03 (×9): qty 200

## 2012-11-03 MED ORDER — POTASSIUM CHLORIDE CRYS ER 20 MEQ PO TBCR
40.0000 meq | EXTENDED_RELEASE_TABLET | Freq: Once | ORAL | Status: AC
  Administered 2012-11-03: 40 meq via ORAL
  Filled 2012-11-03: qty 2

## 2012-11-03 NOTE — Progress Notes (Signed)
ANTIBIOTIC CONSULT NOTE - FOLLOW UP  Pharmacy Consult for Vancomycin Indication: rule out pneumonia  No Known Allergies  Patient Measurements: Height: 6\' 5"  (195.6 cm) Weight: 221 lb 5.5 oz (100.4 kg) IBW/kg (Calculated) : 89.1  Vital Signs: Temp: 99.6 F (37.6 C) (07/16 1200) Temp src: Axillary (07/16 1200) BP: 134/65 mmHg (07/16 1200) Pulse Rate: 98 (07/16 1200) Intake/Output from previous day: 07/15 0701 - 07/16 0700 In: 2413 [P.O.:480; I.V.:1053; IV Piggyback:880] Out: 3150 [Urine:3150]  Labs:  Recent Labs  11/01/12 1138 11/02/12 0505 11/03/12 0340  WBC 11.4* 10.6* 9.6  HGB 11.3* 10.9* 10.1*  PLT 203 216 205  CREATININE 0.93 1.06 1.02   Estimated Creatinine Clearance: 116.5 ml/min (by C-G formula based on Cr of 1.02).  Recent Labs  11/01/12 1859 11/03/12 0950  VANCOTROUGH 10.6 19.9     Anti-infectives   Start     Dose/Rate Route Frequency Ordered Stop   11/03/12 2200  vancomycin (VANCOCIN) IVPB 1000 mg/200 mL premix     1,000 mg 200 mL/hr over 60 Minutes Intravenous Every 8 hours 11/03/12 1342     11/01/12 2000  vancomycin (VANCOCIN) 1,250 mg in sodium chloride 0.9 % 250 mL IVPB  Status:  Discontinued     1,250 mg 166.7 mL/hr over 90 Minutes Intravenous Every 8 hours 11/01/12 1949 11/03/12 1341   10/31/12 1400  piperacillin-tazobactam (ZOSYN) IVPB 3.375 g     3.375 g 12.5 mL/hr over 240 Minutes Intravenous 3 times per day 10/31/12 0446     10/31/12 1400  azithromycin (ZITHROMAX) tablet 500 mg     500 mg Oral Daily 10/31/12 1224     10/31/12 1200  vancomycin (VANCOCIN) IVPB 1000 mg/200 mL premix  Status:  Discontinued     1,000 mg 200 mL/hr over 60 Minutes Intravenous Every 8 hours 10/31/12 0542 11/01/12 1949   10/31/12 0200  piperacillin-tazobactam (ZOSYN) IVPB 3.375 g     3.375 g 100 mL/hr over 30 Minutes Intravenous  Once 10/31/12 0157 10/31/12 0259   10/31/12 0200  vancomycin (VANCOCIN) IVPB 1000 mg/200 mL premix     1,000 mg 200 mL/hr over 60  Minutes Intravenous  Once 10/31/12 0157 10/31/12 0429      Assessment: 47 yo M admit on 7/13 from home with suspected pneumonia.  He had been started on PO Levaquin as an outpatient, but returned to ED with worsening SOB and cough 2 days later.  Pharmacy was asked to dose IV vancomycin.    Day #4 Vancomycin, and Zosyn/Azith (per MD)  SCr stable with CrCl > 100 ml/min  Vancomycin trough 7/14 was 10.6, dose increased to 1250mg  q8h with trough before 4th dose = 19.9 mcg/ml  Goal of Therapy:  Vancomycin trough level 15-20 mcg/ml  Plan:   Reduce Vancomycin back to 1g IV q8h.  Repeat Vanc trough at steady state.  Follow up renal fxn and culture results.  Minda Ditto PharmD Pager [phone] 11/03/2012, 1:48 PM

## 2012-11-03 NOTE — Progress Notes (Signed)
[facility] PROGRESS NOTE  [name] [id number] DOB: 08-02-1965 DOA: 10/31/2012 PCP: [name], MD  Assessment/Plan:  Acute hypoxic Respiratory Failure: Likely related to HCAP+/- narcotic effect. S/p thoracentesis. Appreciate CCM input. Continue oxygen support.  HCAP- Fail outpatient therapy with Levaquin. Continue with IV Vanc, And Zosyn, Zithromax day 4. , Nebulizer Rx. HIV non reactive. sputum culture pending, strep negative, legionella antigen. CT angio negative for PE.   Anemia-  Looks like AOCD. May benefit from colonoscopy as an OP.  HTN- Well controlled.  DM2- A1C 6.0. Continue SSI.   Hypogonadism- continue Androgen Gel daily.   Thrombocytopenia- Could be secondary to infection. Resolved. 205 on 7/16.  Depression//Bipolar- continue with currents medications.   DVT prophylaxis: Lovenox.    Code Status: Full  Family Communication: Patient inly. Disposition Plan: Home when stable. Keep in ICU today.   Consultants:  CCM, Dr. Nelda Marseille  Procedures:  Left thoracentesis on 7/15.  Antibiotics:  Vancomycin 7-13-->  Zosyn 7-13-->  Azithromycin 7/13-->  HPI/Subjective: Still feels SOB. Requiring 15L O2 via partial NRB. Desats with minimal exertion.  Objective: Filed Vitals:   11/03/12 0600 11/03/12 0640 11/03/12 0800 11/03/12 0833  BP:  116/61 124/65   Pulse:  89 89   Temp:      TempSrc:      Resp:  31 29   Height:      Weight: 100.4 kg (221 lb 5.5 oz)     SpO2:  97% 96% 97%    Intake/Output Summary (Last 24 hours) at 11/03/12 0930 Last data filed at 11/03/12 0800  Gross per 24 hour  Intake 2475.5 ml  Output   3150 ml  Net -674.5 ml   Filed Weights   10/31/12 0518 11/03/12 0600  Weight: 103.42 kg (228 lb) 100.4 kg (221 lb 5.5 oz)    Exam:   General:  No distress.   Cardiovascular: S 1, S 2 RRR  Respiratory: Bilateral crackles ronchus.   Abdomen: BS present, soft, NT  Musculoskeletal: no edema.   Data Reviewed: Basic  Metabolic Panel:  Recent Labs Lab 10/31/12 0200 10/31/12 0225 10/31/12 0838 11/01/12 1138 11/02/12 0505 11/03/12 0340  NA 139 143 139 133* 141 140  K 4.3 4.4 4.2 4.2 4.2 3.9  CL 103 105 105 100 104 103  CO2 26  --  26 26 29 30   GLUCOSE 209* 213* 182* 315* 170* 163*  BUN 20 22 16 12 12 11   CREATININE 1.02 1.10 0.96 0.93 1.06 1.02  CALCIUM 9.6  --  8.7 8.7 8.7 8.8  MG  --   --   --   --   --  2.1   Liver Function Tests:  Recent Labs Lab 10/28/12 1600 10/31/12 0200 11/02/12 1255  AST 13 23  --   ALT 15 23  --   ALKPHOS 87 92  --   BILITOT 0.3 0.4  --   PROT 7.2 8.1 6.3  ALBUMIN 3.2* 3.4*  --    No results found for this basename: LIPASE, AMYLASE,  in the last 168 hours No results found for this basename: AMMONIA,  in the last 168 hours CBC:  Recent Labs Lab 10/28/12 1600 10/31/12 0225 10/31/12 0340 11/01/12 1138 11/02/12 0505 11/03/12 0340  WBC 9.1  --  9.2 11.4* 10.6* 9.6  NEUTROABS  --   --  7.5  --   --   --   HGB 11.6* 9.9* 9.3* 11.3* 10.9* 10.1*  HCT 33.2* 29.0* 26.4* 32.8* 31.8* 29.4*  MCV 85.3  --  84.6 85.9 86.4 86.2  PLT 180  --  125* 203 216 205   Cardiac Enzymes:  Recent Labs Lab 10/31/12 0200  TROPONINI <0.30   BNP (last 3 results) No results found for this basename: PROBNP,  in the last 8760 hours CBG: No results found for this basename: GLUCAP,  in the last 168 hours  Recent Results (from the past 240 hour(s))  CULTURE, BLOOD (ROUTINE X 2)     Status: None   Collection Time    10/31/12  2:35 AM      Result Value Range Status   Specimen Description BLOOD LEFT ANTECUBITAL   Final   Special Requests BOTTLES DRAWN AEROBIC AND ANAEROBIC 5CC   Final   Culture  Setup Time 10/31/2012 15:14   Final   Culture     Final   Value:        BLOOD CULTURE RECEIVED NO GROWTH TO DATE CULTURE WILL BE HELD FOR 5 DAYS BEFORE ISSUING A FINAL NEGATIVE REPORT   Report Status PENDING   Incomplete  CULTURE, BLOOD (ROUTINE X 2)     Status: None   Collection  Time    10/31/12  2:40 AM      Result Value Range Status   Specimen Description BLOOD RIGHT FOREARM   Final   Special Requests BOTTLES DRAWN AEROBIC AND ANAEROBIC 5CC   Final   Culture  Setup Time 10/31/2012 15:15   Final   Culture     Final   Value:        BLOOD CULTURE RECEIVED NO GROWTH TO DATE CULTURE WILL BE HELD FOR 5 DAYS BEFORE ISSUING A FINAL NEGATIVE REPORT   Report Status PENDING   Incomplete  CULTURE, RESPIRATORY (NON-EXPECTORATED)     Status: None   Collection Time    10/31/12 10:00 AM      Result Value Range Status   Specimen Description SPUTUM   Final   Special Requests NONE   Final   Gram Stain     Final   Value: RARE WBC PRESENT, PREDOMINANTLY PMN     NO SQUAMOUS EPITHELIAL CELLS SEEN     RARE GRAM NEGATIVE RODS     RARE YEAST   Culture RARE STREPTOCOCCUS,BETA HEMOLYIC NOT GROUP A   Final   Report Status 11/02/2012 FINAL   Final  CULTURE, EXPECTORATED SPUTUM-ASSESSMENT     Status: None   Collection Time    10/31/12 10:02 AM      Result Value Range Status   Specimen Description SPU   Final   Special Requests Normal   Final   Sputum evaluation     Final   Value: THIS SPECIMEN IS ACCEPTABLE. RESPIRATORY CULTURE REPORT TO FOLLOW.   Report Status 10/31/2012 FINAL   Final  MRSA PCR SCREENING     Status: None   Collection Time    11/02/12 11:02 AM      Result Value Range Status   MRSA by PCR NEGATIVE  NEGATIVE Final   Comment:            The GeneXpert MRSA Assay (FDA     approved for NASAL specimens     only), is one component of a     comprehensive MRSA colonization     surveillance program. It is not     intended to diagnose MRSA     infection nor to guide or     monitor treatment for     MRSA infections.  BODY FLUID CULTURE     Status: None   Collection Time    11/02/12 12:02 PM      Result Value Range Status   Specimen Description PLEURAL   Final   Special Requests NONE   Final   Gram Stain     Final   Value: RARE WBC PRESENT, PREDOMINANTLY PMN      NO ORGANISMS SEEN   Culture PENDING   Incomplete   Report Status PENDING   Incomplete     Studies: Dg Chest 1 View  11/02/2012   *RADIOLOGY REPORT*  Clinical Data: Shortness of breath, hypoxia  CHEST - 1 VIEW  Comparison: 10/31/2012  Findings: There is moderate left pleural effusion slight increase from prior exam with left lower lobe atelectasis or infiltrate. Mild interstitial prominence bilaterally.  Superimposed mild interstitial edema cannot be excluded.  IMPRESSION: There is moderate left pleural effusion slight increase from prior exam with left lower lobe atelectasis or infiltrate.  Mild interstitial prominence bilaterally.  Superimposed mild interstitial edema cannot be excluded.   Original Report Authenticated By: Lahoma Crocker, M.D.   Dg Chest Port 1 View  11/03/2012   *RADIOLOGY REPORT*  Clinical Data: Evaluate endotracheal tube and pleural effusion  PORTABLE CHEST - 1 VIEW  Comparison: Portable chest x-ray of 11/02/2012  Findings: There is little change in diffuse airspace disease and aeration.  Left basilar opacities consistent atelectasis and possible small left effusion.  Heart size is stable.  IMPRESSION: No significant change in diffuse airspace disease.  Left basilar opacity consistent with atelectasis and small effusion.   Original Report Authenticated By: Ivar Drape, M.D.   Dg Chest Port 1 View  11/02/2012   *RADIOLOGY REPORT*  Clinical Data: Thoracentesis.  PORTABLE CHEST - 1 VIEW  Comparison: 11/02/2012 at 12:16 p.m.  Findings: The pneumothorax component of the previously seen left hydropneumothorax is no longer apparent.  This may be due to resolution or may be due to the semi erect positioning.  Low lung volumes noted with cardiomegaly and bilateral interstitial edema.  Patchy airspace opacity at the left lung base adjacent to the moderate size left effusion.  IMPRESSION:  1.  Pneumothorax component of the left hydropneumothorax not readily apparent, possibly from resolution or  obscuration from semi erect positioning. 2.  Stable left pleural effusion with airspace opacity left lung base. 3.  Stable interstitial opacity bilaterally, right greater than left.   Original Report Authenticated By: Van Clines, M.D.   Dg Chest Port 1 View  11/02/2012   **ADDENDUM** CREATED: 11/02/2012 12:45:21  These results were called by telephone on 11/02/2012 at 12:45 p.m. to nurse Amy (for Dr. Nelda Marseille), who verbally acknowledged these results.  **END ADDENDUM** SIGNED BY: Etheleen Mayhew, M.D.  11/02/2012   *RADIOLOGY REPORT*  Clinical Data: Status post thoracentesis.  PORTABLE CHEST - 1 VIEW  Comparison: Chest x-ray 11/02/2012.  Findings: Compared to the prior examination there has been interval reduction in size of the left-sided pleural effusion, now only moderate in size.  There is a new small left-sided pneumothorax. Notably, the residual left pleural fluid does not form a horizontal line (typically seen in the setting of hydropneumothorax), suggesting some degree of complexity and loculation.  Extensive multifocal interstitial and airspace disease throughout the right lung and left base is again noted.  Heart size is within normal limits. The patient is rotated to the right on today's exam, resulting in distortion of the mediastinal contours and reduced diagnostic sensitivity and specificity for mediastinal pathology.  IMPRESSION: 1.  Decreased size of left sided pleural effusion following thoracentesis.  There is now a left hydropneumothorax (only a small pneumothorax component), with apparent loculations of the fluid component of hydropneumothorax. 2.  Multifocal interstitial and airspace disease in the lungs bilaterally (right greater than left), concerning for multilobar pneumonia.   Original Report Authenticated By: Vinnie Langton, M.D.    Scheduled Meds: . albuterol  2.5 mg Nebulization TID  . azithromycin  500 mg Oral Daily  . cholecalciferol  2,000 Units Oral q morning - 10a   . cyanocobalamin  500 mcg Oral q morning - 10a  . divalproex  500 mg Oral QHS  . enoxaparin (LOVENOX) injection  40 mg Subcutaneous Q24H  . ferrous sulfate  325 mg Oral BID WC  . lisdexamfetamine  20 mg Oral Q24H  . multivitamin with minerals  1 tablet Oral q morning - 10a  . nicotine  14 mg Transdermal Daily  . piperacillin-tazobactam (ZOSYN)  IV  3.375 g Intravenous Q8H  . QUEtiapine  400 mg Oral QHS  . sodium chloride  3 mL Intravenous Q12H  . testosterone  5 g Transdermal Daily  . vancomycin  1,250 mg Intravenous Q8H   Continuous Infusions: . sodium chloride 50 mL (11/02/12 2233)    Principal Problem:   HCAP (healthcare-associated pneumonia) Active Problems:   BIPOLAR DISORDER UNSPECIFIED   DEPRESSION   HYPERTENSION   HYPOGONADISM   TOBACCO USER   Diabetes type 2, controlled   Anemia   Acute respiratory failure   Pneumonia, organism unspecified   Pleural effusion   Hypoxemia    Time spent: 35 minutes.     [facility] Pager [phone]. If 7PM-7AM, please contact night-coverage at [email or web address], password [facility] 11/03/2012, 9:30 AM  LOS: 3 days

## 2012-11-03 NOTE — Consult Note (Signed)
PULMONARY  / CRITICAL CARE MEDICINE  Name: [name] MRN: [id number] DOB: 28-Apr-1965    ADMISSION DATE:  10/31/2012 CONSULTATION DATE:  11/02/12  REFERRING MD :  Dr. Tyrell Antonio PRIMARY SERVICE: [facility]-->PCCM  CHIEF COMPLAINT:  Dyspnea  BRIEF PATIENT DESCRIPTION: 47 y/o WM admitted with LLL PNA.  Decompensated 7/15 with progressive dyspnea & hypoxemia.   SIGNIFICANT EVENTS / STUDIES:  7/13 - Admit with LLL PNA, dyspnea, cough, pleuritic chest pain, fevers 7/15 - Decompensated on floor, tx to ICU, hypoxemia with sats in 50's on [place], L Thora with 1.4 L removed  LINES / TUBES:   CULTURES: 7/13 Sputum >>> rare streptococcus>>> 7/13 BCx2 >>> 7/15 Pleural Fluid>>>   ANTIBIOTICS: Azithro 7/13 >>> Zosyn 7/13>>> Vanco 7/13>>>   SUBJECTIVE:  Nods yes to shortness of breath.  Denies pain, n/v/d.   VITAL SIGNS: Temp:  [98.2 F (36.8 C)-99.6 F (37.6 C)] 98.6 F (37 C) (07/16 0400) Pulse Rate:  [84-112] 89 (07/16 0800) Resp:  [20-33] 29 (07/16 0800) BP: (110-128)/(45-75) 124/65 mmHg (07/16 0800) SpO2:  [90 %-99 %] 97 % (07/16 0833) FiO2 (%):  [100 %] 100 % (07/15 1200) Weight:  [221 lb 5.5 oz (100.4 kg)] 221 lb 5.5 oz (100.4 kg) (07/16 0600)  HEMODYNAMICS:    VENTILATOR SETTINGS: Vent Mode:  [-]  FiO2 (%):  [100 %] 100 %  INTAKE / OUTPUT: Intake/Output     07/15 0701 - 07/16 0700 07/16 0701 - 07/17 0700   P.O. 480    I.V. (mL/kg) 1053 (10.5) 50 (0.5)   IV Piggyback 880 12.5   Total Intake(mL/kg) 2413 (24) 62.5 (0.6)   Urine (mL/kg/hr) 3150 (1.3)    Total Output 3150     Net -737 +62.5          PHYSICAL EXAMINATION: General:  Young adult male with increased WOB Neuro:  AAOx4, speech clear, MAE HEENT:  Mm pink/dry, no jvd Cardiovascular:  s1s2 rrr, no m/r/g Lungs:  resp's shallow, mildly labored, lungs bilaterally diminished, LL posterior crackles Abdomen:  Round/soft, bsx4 active Musculoskeletal:  No acute deformities Skin:  LE's with multiple scars, no  edema  LABS:  Recent Labs Lab 10/28/12 1600 10/31/12 0200  10/31/12 0226  11/01/12 1138 11/02/12 0505 11/02/12 1035 11/02/12 1255 11/03/12 0340  HGB 11.6*  --   < >  --   < > 11.3* 10.9*  --   --  10.1*  WBC 9.1  --   --   --   < > 11.4* 10.6*  --   --  9.6  PLT 180  --   --   --   < > 203 216  --   --  205  NA 139 139  < >  --   < > 133* 141  --   --  140  K 3.7 4.3  < >  --   < > 4.2 4.2  --   --  3.9  CL 104 103  < >  --   < > 100 104  --   --  103  CO2 25 26  --   --   < > 26 29  --   --  30  GLUCOSE 187* 209*  < >  --   < > 315* 170*  --   --  163*  BUN 19 20  < >  --   < > 12 12  --   --  11  CREATININE 0.97 1.02  < >  --   < >  0.93 1.06  --   --  1.02  CALCIUM 9.4 9.6  --   --   < > 8.7 8.7  --   --  8.8  MG  --   --   --   --   --   --   --   --   --  2.1  AST 13 23  --   --   --   --   --   --   --   --   ALT 15 23  --   --   --   --   --   --   --   --   ALKPHOS 87 92  --   --   --   --   --   --   --   --   BILITOT 0.3 0.4  --   --   --   --   --   --   --   --   PROT 7.2 8.1  --   --   --   --   --   --  6.3  --   ALBUMIN 3.2* 3.4*  --   --   --   --   --   --   --   --   INR 0.99  --   --   --   --   --   --   --   --   --   LATICACIDVEN  --   --   --  1.55  --   --   --   --  1.6  --   TROPONINI  --  <0.30  --   --   --   --   --   --   --   --   PHART  --   --   --   --   --   --   --  7.407  --   --   PCO2ART  --   --   --   --   --   --   --  39.1  --   --   PO2ART  --   --   --   --   --   --   --  64.5*  --   --   < > = values in this interval not displayed. No results found for this basename: GLUCAP,  in the last 168 hours  CXR: 7/15 - moderate L pleural effusion, patchy airspace disease in R  ASSESSMENT / PLAN:  PULMONARY A: L CAP - rare streptococcus in sputum culture L Pleural Exudative Effusion - s/p thoracentesis 7/15 with 1.5 L removed Pulmonary Edema   Hypoxemia - Pa/FiO2 ratio 64 upon transfer to ICU  P:   -follow cxr -oxygen to keep  sats > 92% -monitor for clinical decompensation, may require intubation -see ID section  -KVO IVF -lasix x1 7/16  -pulmonary hygiene -follow pleural fluid   CARDIOVASCULAR A:  Sinus Tachycardia - in setting of PNA P:  -supportive care -KVO IVF with pulmonary edema  RENAL A:   At risk ATN / PreRenal fx - in setting of CAP P:   -monitor renal function  GASTROINTESTINAL A:   NPO P:   -liquid diet, advance to heart healthy as tolerated  HEMATOLOGIC A:   Anemia - normocytic, IDA vs ACD Thrombocytopenia - in setting of acute illness  P:  -follow H/H, no  evidence of acute blood loss -will need further work up as outpatient -Iron   INFECTIOUS A:   CAP - rx'd for 2 days with levaquin as outpt prior to admit Exudative Pleural Effusion - based on protein & LDH P:   -would not consider levaquin failure -continue current abx until sputum culture finalized to r/o atypical's -assess lactic acid, PCT now  ENDOCRINE A:   Hyperglycemia Hypogonadism   P:   -continue androgen gel -continue SSI -A1C pending   NEUROLOGIC A:   Pain - secondary to CAP Depression / Bipolar Disorder   P:   -caution with PRN Narc's -continue current PSY regimen  Developing ARDS with PNA, thora fluid not impressive for empyema, no need for CT, will consider BiPAP.  I have personally obtained a history, examined the patient, evaluated laboratory and imaging results, formulated the assessment and plan and placed orders.  CRITICAL CARE: The patient is critically ill with multiple organ systems failure and requires high complexity decision making for assessment and support, frequent evaluation and titration of therapies, application of advanced monitoring technologies and extensive interpretation of multiple databases. Critical Care Time devoted to patient care services described in this note is 35 minutes.   11/03/2012, 9:47 AM  Rush Farmer, M.D. [facility] Pulmonary/Critical Care  Medicine. Pager: [phone]. After hours pager: [phone].

## 2012-11-04 ENCOUNTER — Inpatient Hospital Stay (HOSPITAL_COMMUNITY): Payer: MEDICAID

## 2012-11-04 LAB — BASIC METABOLIC PANEL
BUN: 17 mg/dL (ref 6–23)
Calcium: 8.9 mg/dL (ref 8.4–10.5)
GFR calc Af Amer: >90 mL/min (ref >90)
GFR calc non Af Amer: >90 mL/min (ref >90)
Glucose, Bld: 166 mg/dL — ABNORMAL HIGH (ref 70–99)
Potassium: 3.7 mEq/L (ref 3.5–5.1)
Sodium: 139 mEq/L (ref 135–145)

## 2012-11-04 LAB — CBC
Hemoglobin: 10.6 g/dL — ABNORMAL LOW (ref 13.0–17.0)
MCH: 29.4 pg (ref 26.0–34.0)
MCHC: 34.1 g/dL (ref 30.0–36.0)
Platelets: 217 10*3/uL (ref 150–400)

## 2012-11-04 MED ORDER — FUROSEMIDE 10 MG/ML IJ SOLN
20.0000 mg | Freq: Once | INTRAMUSCULAR | Status: AC
  Administered 2012-11-04: 20 mg via INTRAVENOUS

## 2012-11-04 MED ORDER — POTASSIUM CHLORIDE CRYS ER 20 MEQ PO TBCR
40.0000 meq | EXTENDED_RELEASE_TABLET | Freq: Three times a day (TID) | ORAL | Status: AC
  Administered 2012-11-04 (×2): 40 meq via ORAL
  Filled 2012-11-04 (×2): qty 2

## 2012-11-04 MED ORDER — GUAIFENESIN ER 600 MG PO TB12
1200.0000 mg | ORAL_TABLET | Freq: Two times a day (BID) | ORAL | Status: DC
  Administered 2012-11-04 – 2012-11-08 (×9): 1200 mg via ORAL
  Filled 2012-11-04 (×10): qty 2

## 2012-11-04 MED ORDER — BIOTENE DRY MOUTH MT LIQD
15.0000 mL | Freq: Two times a day (BID) | OROMUCOSAL | Status: DC
  Administered 2012-11-04 – 2012-11-08 (×10): 15 mL via OROMUCOSAL

## 2012-11-04 MED ORDER — FUROSEMIDE 10 MG/ML IJ SOLN
20.0000 mg | Freq: Once | INTRAMUSCULAR | Status: DC
  Filled 2012-11-04: qty 2

## 2012-11-04 MED ORDER — CLONAZEPAM 1 MG PO TABS
1.0000 mg | ORAL_TABLET | Freq: Two times a day (BID) | ORAL | Status: DC
  Administered 2012-11-04 – 2012-11-05 (×3): 1 mg via ORAL
  Filled 2012-11-04 (×3): qty 1

## 2012-11-04 MED ORDER — FUROSEMIDE 10 MG/ML IJ SOLN
40.0000 mg | Freq: Once | INTRAMUSCULAR | Status: AC
  Administered 2012-11-04: 40 mg via INTRAVENOUS
  Filled 2012-11-04: qty 4

## 2012-11-04 NOTE — Progress Notes (Signed)
PULMONARY  / CRITICAL CARE MEDICINE  Name: [name] MRN: [id number] DOB: [birth year]    ADMISSION DATE:  10/31/2012 CONSULTATION DATE:  11/02/12  REFERRING MD :  Dr. Tyrell Antonio PRIMARY SERVICE: [facility]-->PCCM  CHIEF COMPLAINT:  Dyspnea  BRIEF PATIENT DESCRIPTION: 47 y/o WM admitted with LLL PNA.  Decompensated 7/15 with progressive dyspnea & hypoxemia.   SIGNIFICANT EVENTS / STUDIES:  7/13 - Admit with LLL PNA, dyspnea, cough, pleuritic chest pain, fevers 7/15 - Decompensated on floor, tx to ICU, hypoxemia with sats in 50's on [place], L Thora with 1.4 L removed  LINES / TUBES:   CULTURES: 7/13 Sputum >>> rare streptococcus>>> 7/13 BCx2 >>> 7/15 Pleural Fluid>>>   ANTIBIOTICS: Azithro 7/13 >>> Zosyn 7/13>>> Vanco 7/13>>>   SUBJECTIVE:  Nods yes to shortness of breath.  Denies pain, n/v/d.   VITAL SIGNS: Temp:  [98.2 F (36.8 C)-100.9 F (38.3 C)] 100.9 F (38.3 C) (07/17 0800) Pulse Rate:  [89-98] 93 (07/17 0600) Resp:  [25-32] 30 (07/17 0600) BP: (105-134)/(57-68) 121/59 mmHg (07/17 0744) SpO2:  [88 %-99 %] 93 % (07/17 0844) FiO2 (%):  [50 %] 50 % (07/16 2000)  HEMODYNAMICS:    VENTILATOR SETTINGS: Vent Mode:  [-]  FiO2 (%):  [50 %] 50 %  INTAKE / OUTPUT: Intake/Output     07/16 0701 - 07/17 0700 07/17 0701 - 07/18 0700   P.O. 360    I.V. (mL/kg) 1050 (10.5) 100 (1)   IV Piggyback 562.5 12.5   Total Intake(mL/kg) 1972.5 (19.6) 112.5 (1.1)   Urine (mL/kg/hr) 3275 (1.4) 1400 (3.8)   Total Output 3275 1400   Net -1302.5 -1287.5        Urine Occurrence 1 x      PHYSICAL EXAMINATION: General:  Young adult male with increased WOB Neuro:  AAOx4, speech clear, MAE HEENT:  Mm pink/dry, no jvd Cardiovascular:  s1s2 rrr, no m/r/g Lungs:  resp's shallow, mildly labored, lungs bilaterally diminished, LL posterior crackles Abdomen:  Round/soft, bsx4 active Musculoskeletal:  No acute deformities Skin:  LE's with multiple scars, no edema  LABS:  Recent Labs Lab  10/28/12 1600 10/31/12 0200  10/31/12 0226  11/02/12 0505 11/02/12 1035 11/02/12 1255 11/03/12 0340 11/04/12 0328  HGB 11.6*  --   < >  --   < > 10.9*  --   --  10.1* 10.6*  WBC 9.1  --   --   --   < > 10.6*  --   --  9.6 8.5  PLT 180  --   --   --   < > 216  --   --  205 217  NA 139 139  < >  --   < > 141  --   --  140 139  K 3.7 4.3  < >  --   < > 4.2  --   --  3.9 3.7  CL 104 103  < >  --   < > 104  --   --  103 100  CO2 25 26  --   --   < > 29  --   --  30 29  GLUCOSE 187* 209*  < >  --   < > 170*  --   --  163* 166*  BUN 19 20  < >  --   < > 12  --   --  11 17  CREATININE 0.97 1.02  < >  --   < > 1.06  --   --  1.02 0.94  CALCIUM 9.4 9.6  --   --   < > 8.7  --   --  8.8 8.9  MG  --   --   --   --   --   --   --   --  2.1  --   AST 13 23  --   --   --   --   --   --   --   --   ALT 15 23  --   --   --   --   --   --   --   --   ALKPHOS 87 92  --   --   --   --   --   --   --   --   BILITOT 0.3 0.4  --   --   --   --   --   --   --   --   PROT 7.2 8.1  --   --   --   --   --  6.3  --   --   ALBUMIN 3.2* 3.4*  --   --   --   --   --   --   --   --   INR 0.99  --   --   --   --   --   --   --   --   --   LATICACIDVEN  --   --   --  1.55  --   --   --  1.6  --   --   TROPONINI  --  <0.30  --   --   --   --   --   --   --   --   PHART  --   --   --   --   --   --  7.407  --   --   --   PCO2ART  --   --   --   --   --   --  39.1  --   --   --   PO2ART  --   --   --   --   --   --  64.5*  --   --   --   < > = values in this interval not displayed. No results found for this basename: GLUCAP,  in the last 168 hours  CXR: 7/15 - moderate L pleural effusion, patchy airspace disease in R  ASSESSMENT / PLAN:  PULMONARY A: L CAP - rare streptococcus in sputum culture L Pleural Exudative Effusion - s/p thoracentesis 7/15 with 1.5 L removed Pulmonary Edema   Hypoxemia - Pa/FiO2 ratio 64 upon transfer to ICU  P:   - Follow CXR. - Oxygen to keep sats > 92%. - Monitor for clinical  decompensation, may require intubation. - See ID section. - KVO IVF. - Lasix additional 20 mg IV x1. - Pulmonary hygiene. - Humibid LA.  CARDIOVASCULAR A:  Sinus Tachycardia - in setting of PNA P:  - Supportive care. - KVO IVF with pulmonary edema. - Lasix as above.  RENAL A:   At risk ATN / PreRenal fx - in setting of CAP P:   - Monitor renal function. - Lasix and K as ordered.  GASTROINTESTINAL A:   NPO P:   - Full diet.  HEMATOLOGIC A:   Anemia - normocytic, IDA vs ACD Thrombocytopenia - in setting of  acute illness  P:  - Follow H/H, no evidence of acute blood loss - Will need further work up as outpatient - Iron supplement.  INFECTIOUS A:   CAP - rx'd for 2 days with levaquin as outpt prior to admit Exudative Pleural Effusion - based on protein & LDH P:   - Would not consider levaquin failure. - Continue current abx until sputum culture finalized to r/o atypical's. - Assess lactic acid, PCT now.  ENDOCRINE A:   Hyperglycemia Hypogonadism   P:   - Continue androgen gel. - Continue SSI. - A1C pending.  NEUROLOGIC A:   Pain - secondary to CAP Depression / Bipolar Disorder   P:   - Caution with PRN Narc's - Continue current PSY regimen  Developing ARDS with PNA, thora fluid not impressive for empyema, no need for CT, will consider BiPAP or intubation when tires out..  I have personally obtained a history, examined the patient, evaluated laboratory and imaging results, formulated the assessment and plan and placed orders.  CRITICAL CARE: The patient is critically ill with multiple organ systems failure and requires high complexity decision making for assessment and support, frequent evaluation and titration of therapies, application of advanced monitoring technologies and extensive interpretation of multiple databases. Critical Care Time devoted to patient care services described in this note is 35 minutes.   11/04/2012, 10:43 AM  Rush Farmer, M.D. [facility] Pulmonary/Critical Care Medicine. Pager: [phone]. After hours pager: [phone].

## 2012-11-04 NOTE — Progress Notes (Signed)
[id number] Rosana Hoes, RN, BSN, CCM:  CHART REVIEWED AND UPDATED.  Patient transferred down to sdu due to increased dyspnea and o2 desat down into the 80's, on nrb mask with o2 at 40%.  Next chart review due on [id number]. NO DISCHARGE NEEDS PRESENT AT THIS TIME. CASE MANAGEMENT [phone]

## 2012-11-04 NOTE — Progress Notes (Signed)
[facility] PROGRESS NOTE  TUSTIN BOARD [id number] DOB: [birth year] DOA: 10/31/2012 PCP: [name], MD  Assessment/Plan:  Acute hypoxic Respiratory Failure: Likely related to HCAP+/- narcotic effect. S/p thoracentesis. Appreciate CCM input. Continue oxygen support. Lasix given yesterday with brisk response. CXR looks unchanged. Will DC IVF and give another dose of lasix 40 x 1. Remains on a PNRB at 15 L.  HCAP- . Continue with IV Vanc, And Zosyn, Zithromax day 5. , Nebulizer Rx. HIV non reactive. sputum culture pending, strep negative, legionella antigen. CT angio negative for PE.   Anemia-  Looks like AOCD. May benefit from colonoscopy as an OP.  HTN- Well controlled.  DM2- A1C 6.0. Continue SSI.   Hypogonadism- continue Androgen Gel daily.   Thrombocytopenia- Could be secondary to infection. Resolved. 205 on 7/16. 217 7/17.  Depression//Bipolar- continue with currents medications.   DVT prophylaxis: Lovenox.    Code Status: Full  Family Communication: Patient only. Disposition Plan: Home when stable. Keep in ICU today.   Consultants:  CCM, Dr. Nelda Marseille  Procedures:  Left thoracentesis on 7/15.  Antibiotics:  Vancomycin 7-13-->  Zosyn 7-13-->  Azithromycin 7/13-->  HPI/Subjective: Still feels SOB. Requiring 15L O2 via partial NRB. Desats with minimal exertion.  Objective: Filed Vitals:   11/04/12 0400 11/04/12 0600 11/04/12 0744 11/04/12 0800  BP: 117/61 105/68 121/59   Pulse: 92 93    Temp: 98.9 F (37.2 C)   100.9 F (38.3 C)  TempSrc: Axillary   Axillary  Resp: 30 30    Height:      Weight:      SpO2: 93% 92%      Intake/Output Summary (Last 24 hours) at 11/04/12 0841 Last data filed at 11/04/12 0700  Gross per 24 hour  Intake   1910 ml  Output   3275 ml  Net  -1365 ml   Filed Weights   10/31/12 0518 11/03/12 0600  Weight: 103.42 kg (228 lb) 100.4 kg (221 lb 5.5 oz)    Exam:   General:  No distress.   Cardiovascular: S  1, S 2 RRR  Respiratory: Bilateral crackles ronchus.   Abdomen: BS present, soft, NT  Musculoskeletal: no edema.   Data Reviewed: Basic Metabolic Panel:  Recent Labs Lab 10/31/12 0838 11/01/12 1138 11/02/12 0505 11/03/12 0340 11/04/12 0328  NA 139 133* 141 140 139  K 4.2 4.2 4.2 3.9 3.7  CL 105 100 104 103 100  CO2 26 26 29 30 29   GLUCOSE 182* 315* 170* 163* 166*  BUN 16 12 12 11 17   CREATININE 0.96 0.93 1.06 1.02 0.94  CALCIUM 8.7 8.7 8.7 8.8 8.9  MG  --   --   --  2.1  --    Liver Function Tests:  Recent Labs Lab 10/28/12 1600 10/31/12 0200 11/02/12 1255  AST 13 23  --   ALT 15 23  --   ALKPHOS 87 92  --   BILITOT 0.3 0.4  --   PROT 7.2 8.1 6.3  ALBUMIN 3.2* 3.4*  --    No results found for this basename: LIPASE, AMYLASE,  in the last 168 hours No results found for this basename: AMMONIA,  in the last 168 hours CBC:  Recent Labs Lab 10/31/12 0340 11/01/12 1138 11/02/12 0505 11/03/12 0340 11/04/12 0328  WBC 9.2 11.4* 10.6* 9.6 8.5  NEUTROABS 7.5  --   --   --   --   HGB 9.3* 11.3* 10.9* 10.1* 10.6*  HCT 26.4* 32.8*  31.8* 29.4* 31.1*  MCV 84.6 85.9 86.4 86.2 86.1  PLT 125* 203 216 205 217   Cardiac Enzymes:  Recent Labs Lab 10/31/12 0200  TROPONINI <0.30   BNP (last 3 results) No results found for this basename: PROBNP,  in the last 8760 hours CBG: No results found for this basename: GLUCAP,  in the last 168 hours  Recent Results (from the past 240 hour(s))  CULTURE, BLOOD (ROUTINE X 2)     Status: None   Collection Time    10/31/12  2:35 AM      Result Value Range Status   Specimen Description BLOOD LEFT ANTECUBITAL   Final   Special Requests BOTTLES DRAWN AEROBIC AND ANAEROBIC 5CC   Final   Culture  Setup Time 10/31/2012 15:14   Final   Culture     Final   Value:        BLOOD CULTURE RECEIVED NO GROWTH TO DATE CULTURE WILL BE HELD FOR 5 DAYS BEFORE ISSUING A FINAL NEGATIVE REPORT   Report Status PENDING   Incomplete  CULTURE, BLOOD  (ROUTINE X 2)     Status: None   Collection Time    10/31/12  2:40 AM      Result Value Range Status   Specimen Description BLOOD RIGHT FOREARM   Final   Special Requests BOTTLES DRAWN AEROBIC AND ANAEROBIC 5CC   Final   Culture  Setup Time 10/31/2012 15:15   Final   Culture     Final   Value:        BLOOD CULTURE RECEIVED NO GROWTH TO DATE CULTURE WILL BE HELD FOR 5 DAYS BEFORE ISSUING A FINAL NEGATIVE REPORT   Report Status PENDING   Incomplete  CULTURE, RESPIRATORY (NON-EXPECTORATED)     Status: None   Collection Time    10/31/12 10:00 AM      Result Value Range Status   Specimen Description SPUTUM   Final   Special Requests NONE   Final   Gram Stain     Final   Value: RARE WBC PRESENT, PREDOMINANTLY PMN     NO SQUAMOUS EPITHELIAL CELLS SEEN     RARE GRAM NEGATIVE RODS     RARE YEAST   Culture RARE STREPTOCOCCUS,BETA HEMOLYIC NOT GROUP A   Final   Report Status 11/02/2012 FINAL   Final  CULTURE, EXPECTORATED SPUTUM-ASSESSMENT     Status: None   Collection Time    10/31/12 10:02 AM      Result Value Range Status   Specimen Description SPU   Final   Special Requests Normal   Final   Sputum evaluation     Final   Value: THIS SPECIMEN IS ACCEPTABLE. RESPIRATORY CULTURE REPORT TO FOLLOW.   Report Status 10/31/2012 FINAL   Final  MRSA PCR SCREENING     Status: None   Collection Time    11/02/12 11:02 AM      Result Value Range Status   MRSA by PCR NEGATIVE  NEGATIVE Final   Comment:            The GeneXpert MRSA Assay (FDA     approved for NASAL specimens     only), is one component of a     comprehensive MRSA colonization     surveillance program. It is not     intended to diagnose MRSA     infection nor to guide or     monitor treatment for     MRSA infections.  AFB  CULTURE WITH SMEAR     Status: None   Collection Time    11/02/12 12:02 PM      Result Value Range Status   Specimen Description LUNG PLEURAL   Final   Special Requests NONE   Final   ACID FAST SMEAR NO  ACID FAST BACILLI SEEN   Final   Culture     Final   Value: CULTURE WILL BE EXAMINED FOR 6 WEEKS BEFORE ISSUING A FINAL REPORT   Report Status PENDING   Incomplete  BODY FLUID CULTURE     Status: None   Collection Time    11/02/12 12:02 PM      Result Value Range Status   Specimen Description PLEURAL   Final   Special Requests NONE   Final   Gram Stain     Final   Value: RARE WBC PRESENT, PREDOMINANTLY PMN     NO ORGANISMS SEEN   Culture NO GROWTH 1 DAY   Final   Report Status PENDING   Incomplete  FUNGUS CULTURE W SMEAR     Status: None   Collection Time    11/02/12 12:02 PM      Result Value Range Status   Specimen Description PLEURAL   Final   Special Requests Normal   Final   Fungal Smear NO YEAST OR FUNGAL ELEMENTS SEEN   Final   Culture CULTURE IN PROGRESS FOR FOUR WEEKS   Final   Report Status PENDING   Incomplete     Studies: Dg Chest 1 View  11/02/2012   *RADIOLOGY REPORT*  Clinical Data: Shortness of breath, hypoxia  CHEST - 1 VIEW  Comparison: 10/31/2012  Findings: There is moderate left pleural effusion slight increase from prior exam with left lower lobe atelectasis or infiltrate. Mild interstitial prominence bilaterally.  Superimposed mild interstitial edema cannot be excluded.  IMPRESSION: There is moderate left pleural effusion slight increase from prior exam with left lower lobe atelectasis or infiltrate.  Mild interstitial prominence bilaterally.  Superimposed mild interstitial edema cannot be excluded.   Original Report Authenticated By: Lahoma Crocker, M.D.   Dg Chest Port 1 View  11/04/2012   *RADIOLOGY REPORT*  Clinical Data: Airspace disease, follow-up  PORTABLE CHEST - 1 VIEW  Comparison: Portable chest x-ray of 11/03/2012  Findings: No endotracheal tube is seen.  There is little change in aeration, with no change in diffuse airspace disease.  There may be a small left effusion present.  Cardiomegaly is stable.  IMPRESSION: No significant change in diffuse airspace  disease and poor aeration.   Original Report Authenticated By: Ivar Drape, M.D.   Dg Chest Port 1 View  11/03/2012   *RADIOLOGY REPORT*  Clinical Data: Evaluate endotracheal tube and pleural effusion  PORTABLE CHEST - 1 VIEW  Comparison: Portable chest x-ray of 11/02/2012  Findings: There is little change in diffuse airspace disease and aeration.  Left basilar opacities consistent atelectasis and possible small left effusion.  Heart size is stable.  IMPRESSION: No significant change in diffuse airspace disease.  Left basilar opacity consistent with atelectasis and small effusion.   Original Report Authenticated By: Ivar Drape, M.D.   Dg Chest Port 1 View  11/02/2012   *RADIOLOGY REPORT*  Clinical Data: Thoracentesis.  PORTABLE CHEST - 1 VIEW  Comparison: 11/02/2012 at 12:16 p.m.  Findings: The pneumothorax component of the previously seen left hydropneumothorax is no longer apparent.  This may be due to resolution or may be due to the semi erect positioning.  Low lung volumes noted with cardiomegaly and bilateral interstitial edema.  Patchy airspace opacity at the left lung base adjacent to the moderate size left effusion.  IMPRESSION:  1.  Pneumothorax component of the left hydropneumothorax not readily apparent, possibly from resolution or obscuration from semi erect positioning. 2.  Stable left pleural effusion with airspace opacity left lung base. 3.  Stable interstitial opacity bilaterally, right greater than left.   Original Report Authenticated By: Van Clines, M.D.   Dg Chest Port 1 View  11/02/2012   **ADDENDUM** CREATED: 11/02/2012 12:45:21  These results were called by telephone on 11/02/2012 at 12:45 p.m. to nurse Amy (for Dr. Nelda Marseille), who verbally acknowledged these results.  **END ADDENDUM** SIGNED BY: Etheleen Mayhew, M.D.  11/02/2012   *RADIOLOGY REPORT*  Clinical Data: Status post thoracentesis.  PORTABLE CHEST - 1 VIEW  Comparison: Chest x-ray 11/02/2012.  Findings: Compared to  the prior examination there has been interval reduction in size of the left-sided pleural effusion, now only moderate in size.  There is a new small left-sided pneumothorax. Notably, the residual left pleural fluid does not form a horizontal line (typically seen in the setting of hydropneumothorax), suggesting some degree of complexity and loculation.  Extensive multifocal interstitial and airspace disease throughout the right lung and left base is again noted.  Heart size is within normal limits. The patient is rotated to the right on today's exam, resulting in distortion of the mediastinal contours and reduced diagnostic sensitivity and specificity for mediastinal pathology.  IMPRESSION: 1.  Decreased size of left sided pleural effusion following thoracentesis.  There is now a left hydropneumothorax (only a small pneumothorax component), with apparent loculations of the fluid component of hydropneumothorax. 2.  Multifocal interstitial and airspace disease in the lungs bilaterally (right greater than left), concerning for multilobar pneumonia.   Original Report Authenticated By: Vinnie Langton, M.D.    Scheduled Meds: . albuterol  2.5 mg Nebulization TID  . antiseptic oral rinse  15 mL Mouth Rinse BID  . azithromycin  500 mg Oral Daily  . cholecalciferol  2,000 Units Oral q morning - 10a  . clonazePAM  0.5 mg Oral BID  . cyanocobalamin  500 mcg Oral q morning - 10a  . divalproex  500 mg Oral QHS  . enoxaparin (LOVENOX) injection  40 mg Subcutaneous Q24H  . ferrous sulfate  325 mg Oral BID WC  . furosemide  40 mg Intravenous Once  . lisdexamfetamine  20 mg Oral Q24H  . multivitamin with minerals  1 tablet Oral q morning - 10a  . nicotine  14 mg Transdermal Daily  . piperacillin-tazobactam (ZOSYN)  IV  3.375 g Intravenous Q8H  . QUEtiapine  400 mg Oral QHS  . sodium chloride  3 mL Intravenous Q12H  . testosterone  5 g Transdermal Daily  . vancomycin  1,000 mg Intravenous Q8H   Continuous  Infusions:    Principal Problem:   HCAP (healthcare-associated pneumonia) Active Problems:   BIPOLAR DISORDER UNSPECIFIED   DEPRESSION   HYPERTENSION   HYPOGONADISM   TOBACCO USER   Diabetes type 2, controlled   Anemia   Acute respiratory failure   Pneumonia, organism unspecified   Pleural effusion   Hypoxemia    Time spent: 35 minutes.     [facility] Pager [phone]. If 7PM-7AM, please contact night-coverage at [email or web address], password [facility] 11/04/2012, 8:41 AM  LOS: 4 days

## 2012-11-05 ENCOUNTER — Inpatient Hospital Stay (HOSPITAL_COMMUNITY): Payer: MEDICAID

## 2012-11-05 LAB — BASIC METABOLIC PANEL
Calcium: 9.2 mg/dL (ref 8.4–10.5)
GFR calc Af Amer: >90 mL/min (ref >90)
GFR calc non Af Amer: >90 mL/min (ref >90)
Potassium: 4.3 mEq/L (ref 3.5–5.1)
Sodium: 135 mEq/L (ref 135–145)

## 2012-11-05 LAB — VANCOMYCIN, TROUGH: Vancomycin Tr: 13.8 ug/mL (ref 10.0–20.0)

## 2012-11-05 LAB — CBC
Hemoglobin: 11.1 g/dL — ABNORMAL LOW (ref 13.0–17.0)
Platelets: 204 10*3/uL (ref 150–400)
RBC: 3.84 MIL/uL — ABNORMAL LOW (ref 4.22–5.81)
WBC: 9.1 10*3/uL (ref 4.0–10.5)

## 2012-11-05 LAB — PHOSPHORUS: Phosphorus: 4.1 mg/dL (ref 2.3–4.6)

## 2012-11-05 LAB — MAGNESIUM: Magnesium: 2.2 mg/dL (ref 1.5–2.5)

## 2012-11-05 MED ORDER — FUROSEMIDE 10 MG/ML IJ SOLN
40.0000 mg | Freq: Once | INTRAMUSCULAR | Status: AC
  Administered 2012-11-05: 40 mg via INTRAVENOUS
  Filled 2012-11-05: qty 4

## 2012-11-05 MED ORDER — POTASSIUM CHLORIDE CRYS ER 20 MEQ PO TBCR
40.0000 meq | EXTENDED_RELEASE_TABLET | Freq: Three times a day (TID) | ORAL | Status: AC
  Administered 2012-11-05 (×2): 40 meq via ORAL
  Filled 2012-11-05 (×2): qty 2

## 2012-11-05 MED ORDER — FUROSEMIDE 10 MG/ML IJ SOLN
20.0000 mg | Freq: Three times a day (TID) | INTRAMUSCULAR | Status: AC
  Administered 2012-11-05 (×2): 20 mg via INTRAVENOUS
  Filled 2012-11-05 (×2): qty 2

## 2012-11-05 MED ORDER — CLONAZEPAM 1 MG PO TABS
1.0000 mg | ORAL_TABLET | Freq: Three times a day (TID) | ORAL | Status: DC
  Administered 2012-11-05 – 2012-11-08 (×9): 1 mg via ORAL
  Filled 2012-11-05 (×9): qty 1

## 2012-11-05 NOTE — Evaluation (Signed)
Physical Therapy Evaluation Patient Details Name: [name] MRN: [id number] DOB: 15-Jul-1965 Today's Date: 11/05/2012 Time: [id number] PT Time Calculation (min): 24 min  PT Assessment / Plan / Recommendation History of Present Illness  47 y.o. male who had been seen in th ED 2 days ago and was diagnosed with a LLL Pneumonia and placed on Levaquin to continue as an outpatient who returns with complaints of worsening sob and Cough and pleuritic chest pain.    He reports coughing up yellowish sputum, and reports having fever and chills off and on.   His pain is in his left side and is worst with deep breaths or coughing.    He was evalauted in the ED, and was found to have worsening of his PNA on a CTA of the Chest which was negative for pulmonary Emboli.    Clinical Impression  On eval, pt required Min assist for mobility-able to ambulate ~40 feet with RW on 6L O2. O2 sats dropped to 89% with activity. Demonstrates general weakness, decreased activity tolerance and impaired dynamic balance. Anticipate pt should progress well during stay. At this time, recommend HHPT at discharge.     PT Assessment  Patient needs continued PT services    Follow Up Recommendations  Home health PT    Does the patient have the potential to tolerate intense rehabilitation      Barriers to Discharge        Equipment Recommendations   (to be determined)    Recommendations for Other Services OT consult   Frequency Min 3X/week    Precautions / Restrictions Precautions Precautions: Fall Precaution Comments: hx of bil hip arthritis and back surgery Restrictions Weight Bearing Restrictions: No   Pertinent Vitals/Pain Pre-session: 93 bpm, 94% 4L O2, 20 RR During session: O2 89% 6L O2 with ambulation Post-sessio: 93 bpm, 95% 4L O2, 16 RR, 141/78 BP  Pain L flank/chest area, bil hips 7/10. RN medicated at start of session     Mobility  Bed Mobility Bed Mobility: Supine to Sit;Sit to Supine Supine to  Sit: 4: Min guard;HOB elevated;With rails Sit to Supine: 4: Min guard;HOB elevated;With rail Transfers Transfers: Sit to Stand Sit to Stand: 4: Min assist;From elevated surface;From bed Stand to Sit: 4: Min guard;To elevated surface;To bed Details for Transfer Assistance: Assist to rise, stabilize.  Ambulation/Gait Ambulation/Gait Assistance: 4: Min assist Ambulation Distance (Feet): 40 Feet Assistive device: Rolling walker Ambulation/Gait Assistance Details: slow gait speed. LOB posteriorly x1 requiring external assist to correct. VCS safety, distance from RW. Ambulated on 6L O2-sats 89% at lowest.  Gait Pattern: Step-through pattern;Step-to pattern;Decreased stride length;Decreased step length - right;Decreased step length - left    Exercises     PT Diagnosis: Difficulty walking;Abnormality of gait;Generalized weakness;Acute pain  PT Problem List: Decreased strength;Decreased activity tolerance;Decreased balance;Decreased mobility;Pain;Decreased knowledge of use of DME;Cardiopulmonary status limiting activity PT Treatment Interventions: DME instruction;Gait training;Stair training;Functional mobility training;Therapeutic exercise;Therapeutic activities;Balance training;Patient/family education     PT Goals(Current goals can be found in the care plan section) Acute Rehab PT Goals Patient Stated Goal: breathe better. regain independence PT Goal Formulation: With patient Time For Goal Achievement: 11/19/12 Potential to Achieve Goals: Good  Visit Information  Last PT Received On: 11/05/12 Assistance Needed: +1 History of Present Illness: 47 y.o. male who had been seen in th ED 2 days ago and was diagnosed with a LLL Pneumonia and placed on Levaquin to continue as an outpatient who returns with complaints of worsening sob and Cough and  pleuritic chest pain.    He reports coughing up yellowish sputum, and reports having fever and chills off and on.   His pain is in his left side and is  worst with deep breaths or coughing.    He was evalauted in the ED, and was found to have worsening of his PNA on a CTA of the Chest which was negative for pulmonary Emboli.         Prior Mark expects to be discharged to:: Private residence Living Arrangements: Spouse/significant other Type of Home: Apartment Home Access: Stairs to enter [employer] of Steps: 15 Entrance Stairs-Rails: Right Home Layout: One level Home Equipment: [place] - single point Prior Function Level of Independence: Independent;Independent with assistive device(s) Comments: cane for long distances    Cognition  Cognition Arousal/Alertness: Awake/alert Behavior During Therapy: WFL for tasks assessed/performed Overall Cognitive Status: Within Functional Limits for tasks assessed    Extremity/Trunk Assessment Upper Extremity Assessment Upper Extremity Assessment: Defer to OT evaluation Lower Extremity Assessment Lower Extremity Assessment: Generalized weakness Cervical / Trunk Assessment Cervical / Trunk Assessment: Normal   Balance    End of Session PT - End of Session Equipment Utilized During Treatment: Gait belt;Oxygen Activity Tolerance: Patient limited by fatigue Patient left: in bed;with call bell/phone within reach  GP     Weston Anna, MPT Pager: [phone]

## 2012-11-05 NOTE — Progress Notes (Signed)
ANTIBIOTIC CONSULT NOTE - FOLLOW UP  Pharmacy Consult for Vancomycin Indication: rule out pneumonia  No Known Allergies   Patient Measurements: Height: 6\' 5"  (195.6 cm) Weight: 221 lb 5.5 oz (100.4 kg) IBW/kg (Calculated) : 89.1  Vital Signs: Temp: 100.7 F (38.2 C) (07/18 0800) Temp src: Axillary (07/18 0800) BP: 115/69 mmHg (07/18 1040) Pulse Rate: 88 (07/18 1040) Intake/Output from previous day: 07/17 0701 - 07/18 0700 In: 1532.5 [P.O.:590; I.V.:100; IV Piggyback:762.5] Out: [id number] [Urine:3425]  Labs:  Recent Labs  11/03/12 0340 11/04/12 0328 11/05/12 0340  WBC 9.6 8.5 9.1  HGB 10.1* 10.6* 11.1*  PLT 205 217 204  CREATININE 1.02 0.94 0.98   Estimated Creatinine Clearance: 121.2 ml/min (by C-G formula based on Cr of 0.98).  Recent Labs  11/03/12 0950 11/05/12 1335  VANCOTROUGH 19.9 13.8     Anti-infectives   Start     Dose/Rate Route Frequency Ordered Stop   11/03/12 2200  vancomycin (VANCOCIN) IVPB 1000 mg/200 mL premix     1,000 mg 200 mL/hr over 60 Minutes Intravenous Every 8 hours 11/03/12 1342     11/01/12 2000  vancomycin (VANCOCIN) 1,250 mg in sodium chloride 0.9 % 250 mL IVPB  Status:  Discontinued     1,250 mg 166.7 mL/hr over 90 Minutes Intravenous Every 8 hours 11/01/12 1949 11/03/12 1341   10/31/12 1400  piperacillin-tazobactam (ZOSYN) IVPB 3.375 g     3.375 g 12.5 mL/hr over 240 Minutes Intravenous 3 times per day 10/31/12 0446     10/31/12 1400  azithromycin (ZITHROMAX) tablet 500 mg  Status:  Discontinued     500 mg Oral Daily 10/31/12 1224 11/05/12 1006   10/31/12 1200  vancomycin (VANCOCIN) IVPB 1000 mg/200 mL premix  Status:  Discontinued     1,000 mg 200 mL/hr over 60 Minutes Intravenous Every 8 hours 10/31/12 0542 11/01/12 1949   10/31/12 0200  piperacillin-tazobactam (ZOSYN) IVPB 3.375 g     3.375 g 100 mL/hr over 30 Minutes Intravenous  Once 10/31/12 0157 10/31/12 0259   10/31/12 0200  vancomycin (VANCOCIN) IVPB 1000 mg/200 mL  premix     1,000 mg 200 mL/hr over 60 Minutes Intravenous  Once 10/31/12 0157 10/31/12 0429      Assessment: 47 yo M admit on 7/13 from home with suspected pneumonia.  He had been started on PO Levaquin as an outpatient, but returned to ED with worsening SOB and cough 2 days later.  Pharmacy was asked to dose IV vancomycin.    Day #6 Vancomycin, and Zosyn  SCr stable with CrCl > 100 ml/min  Vancomycin trough today = 13.8 (goal 15-20).    Tm24h: 100.7  WBC WNL  Goal of Therapy:  Vancomycin trough level 15-20 mcg/ml  Plan:  1.  Continue vancomycin 1gram IV q 8 hours and Zosyn 3.375g IV q8h (infuse over 4 hours) 2.  F/u renal fxn, cultures, clinical course  Ralene Bathe, PharmD 11/05/2012 2:58 PM  Pager: [phone]

## 2012-11-05 NOTE — Progress Notes (Signed)
[facility] PROGRESS NOTE  [name] [id number] DOB: 06/03/1965 DOA: 10/31/2012 PCP: [name], MD  Assessment/Plan:  Acute hypoxic Respiratory Failure: Likely related to HCAP+/- narcotic effect. S/p thoracentesis. Appreciate CCM input. Continue oxygen support. Lasix given yesterday with brisk response. CXR looks unchanged. Will  give another dose of lasix 40 x 1. Remains on a PNRB at 15 L. Discussed with RN, will try to titrate oxygen today.  HCAP- . Continue with IV Vanc, And Zosyn, Zithromax day 6. , Nebulizer Rx. HIV non reactive. sputum culture with rare strep, strep urine negative. CT angio negative for PE.   Anemia-  Looks like AOCD. May benefit from colonoscopy as an OP.  HTN- Well controlled.  DM2- A1C 6.0. Continue SSI.   Hypogonadism- continue Androgen Gel daily.   Thrombocytopenia- Could be secondary to infection. Resolved. 205 on 7/16. 217 7/17.  Depression//Bipolar- continue with currents medications.   DVT prophylaxis: Lovenox.    Code Status: Full  Family Communication: Patient only. Disposition Plan: Home when stable. Keep in ICU today.   Consultants:  CCM, Dr. Nelda Marseille  Procedures:  Left thoracentesis on 7/15.  Antibiotics:  Vancomycin 7-13-->  Zosyn 7-13-->  Azithromycin 7/13-->  HPI/Subjective: Still feels SOB, altho improved from yesterday.  Objective: Filed Vitals:   11/05/12 0500 11/05/12 0600 11/05/12 0800 11/05/12 0806  BP:  111/69    Pulse: 95 89    Temp:   100.7 F (38.2 C)   TempSrc:   Axillary   Resp: 23 20    Height:      Weight:      SpO2: 96% 98%  96%    Intake/Output Summary (Last 24 hours) at 11/05/12 0845 Last data filed at 11/05/12 0522  Gross per 24 hour  Intake   1520 ml  Output   3025 ml  Net  -1505 ml   Filed Weights   10/31/12 0518 11/03/12 0600  Weight: 103.42 kg (228 lb) 100.4 kg (221 lb 5.5 oz)    Exam:   General:  No distress.   Cardiovascular: S 1, S 2 RRR  Respiratory:  Bilateral crackles ronchus.   Abdomen: BS present, soft, NT  Musculoskeletal: no edema.   Data Reviewed: Basic Metabolic Panel:  Recent Labs Lab 11/01/12 1138 11/02/12 0505 11/03/12 0340 11/04/12 0328 11/05/12 0340  NA 133* 141 140 139 135  K 4.2 4.2 3.9 3.7 4.3  CL 100 104 103 100 96  CO2 26 29 30 29 29   GLUCOSE 315* 170* 163* 166* 165*  BUN 12 12 11 17  25*  CREATININE 0.93 1.06 1.02 0.94 0.98  CALCIUM 8.7 8.7 8.8 8.9 9.2  MG  --   --  2.1  --  2.2  PHOS  --   --   --   --  4.1   Liver Function Tests:  Recent Labs Lab 10/31/12 0200 11/02/12 1255  AST 23  --   ALT 23  --   ALKPHOS 92  --   BILITOT 0.4  --   PROT 8.1 6.3  ALBUMIN 3.4*  --    No results found for this basename: LIPASE, AMYLASE,  in the last 168 hours No results found for this basename: AMMONIA,  in the last 168 hours CBC:  Recent Labs Lab 10/31/12 0340 11/01/12 1138 11/02/12 0505 11/03/12 0340 11/04/12 0328 11/05/12 0340  WBC 9.2 11.4* 10.6* 9.6 8.5 9.1  NEUTROABS 7.5  --   --   --   --   --  HGB 9.3* 11.3* 10.9* 10.1* 10.6* 11.1*  HCT 26.4* 32.8* 31.8* 29.4* 31.1* 33.1*  MCV 84.6 85.9 86.4 86.2 86.1 86.2  PLT 125* 203 216 205 217 204   Cardiac Enzymes:  Recent Labs Lab 10/31/12 0200  TROPONINI <0.30   BNP (last 3 results) No results found for this basename: PROBNP,  in the last 8760 hours CBG: No results found for this basename: GLUCAP,  in the last 168 hours  Recent Results (from the past 240 hour(s))  CULTURE, BLOOD (ROUTINE X 2)     Status: None   Collection Time    10/31/12  2:35 AM      Result Value Range Status   Specimen Description BLOOD LEFT ANTECUBITAL   Final   Special Requests BOTTLES DRAWN AEROBIC AND ANAEROBIC 5CC   Final   Culture  Setup Time 10/31/2012 15:14   Final   Culture     Final   Value:        BLOOD CULTURE RECEIVED NO GROWTH TO DATE CULTURE WILL BE HELD FOR 5 DAYS BEFORE ISSUING A FINAL NEGATIVE REPORT   Report Status PENDING   Incomplete   CULTURE, BLOOD (ROUTINE X 2)     Status: None   Collection Time    10/31/12  2:40 AM      Result Value Range Status   Specimen Description BLOOD RIGHT FOREARM   Final   Special Requests BOTTLES DRAWN AEROBIC AND ANAEROBIC 5CC   Final   Culture  Setup Time 10/31/2012 15:15   Final   Culture     Final   Value:        BLOOD CULTURE RECEIVED NO GROWTH TO DATE CULTURE WILL BE HELD FOR 5 DAYS BEFORE ISSUING A FINAL NEGATIVE REPORT   Report Status PENDING   Incomplete  CULTURE, RESPIRATORY (NON-EXPECTORATED)     Status: None   Collection Time    10/31/12 10:00 AM      Result Value Range Status   Specimen Description SPUTUM   Final   Special Requests NONE   Final   Gram Stain     Final   Value: RARE WBC PRESENT, PREDOMINANTLY PMN     NO SQUAMOUS EPITHELIAL CELLS SEEN     RARE GRAM NEGATIVE RODS     RARE YEAST   Culture RARE STREPTOCOCCUS,BETA HEMOLYIC NOT GROUP A   Final   Report Status 11/02/2012 FINAL   Final  CULTURE, EXPECTORATED SPUTUM-ASSESSMENT     Status: None   Collection Time    10/31/12 10:02 AM      Result Value Range Status   Specimen Description SPU   Final   Special Requests Normal   Final   Sputum evaluation     Final   Value: THIS SPECIMEN IS ACCEPTABLE. RESPIRATORY CULTURE REPORT TO FOLLOW.   Report Status 10/31/2012 FINAL   Final  MRSA PCR SCREENING     Status: None   Collection Time    11/02/12 11:02 AM      Result Value Range Status   MRSA by PCR NEGATIVE  NEGATIVE Final   Comment:            The GeneXpert MRSA Assay (FDA     approved for NASAL specimens     only), is one component of a     comprehensive MRSA colonization     surveillance program. It is not     intended to diagnose MRSA     infection nor to guide or  monitor treatment for     MRSA infections.  AFB CULTURE WITH SMEAR     Status: None   Collection Time    11/02/12 12:02 PM      Result Value Range Status   Specimen Description LUNG PLEURAL   Final   Special Requests NONE   Final    ACID FAST SMEAR NO ACID FAST BACILLI SEEN   Final   Culture     Final   Value: CULTURE WILL BE EXAMINED FOR 6 WEEKS BEFORE ISSUING A FINAL REPORT   Report Status PENDING   Incomplete  BODY FLUID CULTURE     Status: None   Collection Time    11/02/12 12:02 PM      Result Value Range Status   Specimen Description PLEURAL   Final   Special Requests NONE   Final   Gram Stain     Final   Value: RARE WBC PRESENT, PREDOMINANTLY PMN     NO ORGANISMS SEEN   Culture NO GROWTH 2 DAYS   Final   Report Status PENDING   Incomplete  FUNGUS CULTURE W SMEAR     Status: None   Collection Time    11/02/12 12:02 PM      Result Value Range Status   Specimen Description PLEURAL   Final   Special Requests Normal   Final   Fungal Smear NO YEAST OR FUNGAL ELEMENTS SEEN   Final   Culture CULTURE IN PROGRESS FOR FOUR WEEKS   Final   Report Status PENDING   Incomplete     Studies: Dg Chest Port 1 View  11/05/2012   *RADIOLOGY REPORT*  Clinical Data: Pneumonia  PORTABLE CHEST - 1 VIEW  Comparison: 11/04/2012; 10/31/2012; chest CT - 10/31/2012  Findings: Grossly unchanged cardiac silhouette and mediastinal contours.  The pulmonary vasculature remains indistinct with cephalization of flow.  Unchanged possibly partially loculated small left-sided pleural effusion.  No definite right-sided pleural effusion.  There is a small amount of fluid layering within the right minor fissure, unchanged.  Grossly unchanged bibasilar heterogeneous air space opacities, left greater than right.  No pneumothorax.  Unchanged bones.  IMPRESSION: Grossly unchanged findings of pulmonary edema with small left-sided effusion and associated left basilar air space disease.  A follow- up chest radiograph in 4 to 6 weeks after treatment is recommended to ensure resolution.   Original Report Authenticated By: Jake Seats, MD   Dg Chest Port 1 View  11/04/2012   *RADIOLOGY REPORT*  Clinical Data: Airspace disease, follow-up  PORTABLE CHEST - 1  VIEW  Comparison: Portable chest x-ray of 11/03/2012  Findings: No endotracheal tube is seen.  There is little change in aeration, with no change in diffuse airspace disease.  There may be a small left effusion present.  Cardiomegaly is stable.  IMPRESSION: No significant change in diffuse airspace disease and poor aeration.   Original Report Authenticated By: Ivar Drape, M.D.    Scheduled Meds: . albuterol  2.5 mg Nebulization TID  . antiseptic oral rinse  15 mL Mouth Rinse BID  . azithromycin  500 mg Oral Daily  . cholecalciferol  2,000 Units Oral q morning - 10a  . clonazePAM  1 mg Oral BID  . cyanocobalamin  500 mcg Oral q morning - 10a  . divalproex  500 mg Oral QHS  . enoxaparin (LOVENOX) injection  40 mg Subcutaneous Q24H  . ferrous sulfate  325 mg Oral BID WC  . guaiFENesin  1,200 mg Oral BID  .  lisdexamfetamine  20 mg Oral Q24H  . multivitamin with minerals  1 tablet Oral q morning - 10a  . nicotine  14 mg Transdermal Daily  . piperacillin-tazobactam (ZOSYN)  IV  3.375 g Intravenous Q8H  . QUEtiapine  400 mg Oral QHS  . sodium chloride  3 mL Intravenous Q12H  . testosterone  5 g Transdermal Daily  . vancomycin  1,000 mg Intravenous Q8H   Continuous Infusions:    Principal Problem:   HCAP (healthcare-associated pneumonia) Active Problems:   BIPOLAR DISORDER UNSPECIFIED   DEPRESSION   HYPERTENSION   HYPOGONADISM   TOBACCO USER   Diabetes type 2, controlled   Anemia   Acute respiratory failure   Pneumonia, organism unspecified   Pleural effusion   Hypoxemia    Time spent: 35 minutes.     [facility] Pager [phone]. If 7PM-7AM, please contact night-coverage at [email or web address], password [facility] 11/05/2012, 8:45 AM  LOS: 5 days

## 2012-11-05 NOTE — Progress Notes (Signed)
PULMONARY  / CRITICAL CARE MEDICINE  Name: [name] MRN: [id number] DOB: [birth year]    ADMISSION DATE:  10/31/2012 CONSULTATION DATE:  11/02/12  REFERRING MD :  Dr. Tyrell Antonio PRIMARY SERVICE: [facility]-->PCCM  CHIEF COMPLAINT:  Dyspnea  BRIEF PATIENT DESCRIPTION: 47 y/o WM admitted with LLL PNA.  Decompensated 7/15 with progressive dyspnea & hypoxemia.   SIGNIFICANT EVENTS / STUDIES:  7/13 - Admit with LLL PNA, dyspnea, cough, pleuritic chest pain, fevers 7/15 - Decompensated on floor, tx to ICU, hypoxemia with sats in 50's on , L Thora with 1.4 L removed  LINES / TUBES:   CULTURES: 7/13 Sputum >>> rare streptococcus>>> 7/13 BCx2 >>> 7/15 Pleural Fluid>>>   ANTIBIOTICS: Azithro 7/13 >>> Zosyn 7/13>>> Vanco 7/13>>>   SUBJECTIVE:  Nods yes to shortness of breath.  Denies pain, n/v/d.   VITAL SIGNS: Temp:  [97.3 F (36.3 C)-100.7 F (38.2 C)] 100.7 F (38.2 C) (07/18 0800) Pulse Rate:  [86-95] 88 (07/18 0800) Resp:  [20-28] 20 (07/18 0800) BP: (107-135)/(54-80) 107/54 mmHg (07/18 0800) SpO2:  [92 %-99 %] 96 % (07/18 0806) FiO2 (%):  [40 %-50 %] 40 % (07/18 0949)  HEMODYNAMICS:    VENTILATOR SETTINGS: Vent Mode:  [-]  FiO2 (%):  [40 %-50 %] 40 %  INTAKE / OUTPUT: Intake/Output     07/17 0701 - 07/18 0700 07/18 0701 - 07/19 0700   P.O. 590    I.V. (mL/kg) 100 (1)    Other 80 40   IV Piggyback 762.5    Total Intake(mL/kg) 1532.5 (15.3) 40 (0.4)   Urine (mL/kg/hr) 3425 (1.4)    Total Output 3425     Net -1892.5 +40        Urine Occurrence 1 x      PHYSICAL EXAMINATION: General:  Young adult male with increased WOB Neuro:  AAOx4, speech clear, MAE HEENT:  Mm pink/dry, no jvd Cardiovascular:  s1s2 rrr, no m/r/g Lungs:  resp's shallow, mildly labored, lungs bilaterally diminished, LL posterior crackles Abdomen:  Round/soft, bsx4 active Musculoskeletal:  No acute deformities Skin:  LE's with multiple scars, no edema  LABS:  Recent Labs Lab  10/31/12 0200  10/31/12 0226  11/02/12 1035 11/02/12 1255 11/03/12 0340 11/04/12 0328 11/05/12 0340  HGB  --   < >  --   < >  --   --  10.1* 10.6* 11.1*  WBC  --   --   --   < >  --   --  9.6 8.5 9.1  PLT  --   --   --   < >  --   --  205 217 204  NA 139  < >  --   < >  --   --  140 139 135  K 4.3  < >  --   < >  --   --  3.9 3.7 4.3  CL 103  < >  --   < >  --   --  103 100 96  CO2 26  --   --   < >  --   --  30 29 29   GLUCOSE 209*  < >  --   < >  --   --  163* 166* 165*  BUN 20  < >  --   < >  --   --  11 17 25*  CREATININE 1.02  < >  --   < >  --   --  1.02 0.94  0.98  CALCIUM 9.6  --   --   < >  --   --  8.8 8.9 9.2  MG  --   --   --   --   --   --  2.1  --  2.2  PHOS  --   --   --   --   --   --   --   --  4.1  AST 23  --   --   --   --   --   --   --   --   ALT 23  --   --   --   --   --   --   --   --   ALKPHOS 92  --   --   --   --   --   --   --   --   BILITOT 0.4  --   --   --   --   --   --   --   --   PROT 8.1  --   --   --   --  6.3  --   --   --   ALBUMIN 3.4*  --   --   --   --   --   --   --   --   LATICACIDVEN  --   --  1.55  --   --  1.6  --   --   --   TROPONINI <0.30  --   --   --   --   --   --   --   --   PHART  --   --   --   --  7.407  --   --   --   --   PCO2ART  --   --   --   --  39.1  --   --   --   --   PO2ART  --   --   --   --  64.5*  --   --   --   --   < > = values in this interval not displayed. No results found for this basename: GLUCAP,  in the last 168 hours  CXR: 7/15 - moderate L pleural effusion, patchy airspace disease in R  ASSESSMENT / PLAN:  PULMONARY A: L CAP - rare streptococcus in sputum culture L Pleural Exudative Effusion - s/p thoracentesis 7/15 with 1.5 L removed Pulmonary Edema   Hypoxemia - Pa/FiO2 ratio 64 upon transfer to ICU  P:   - Follow CXR. - Oxygen to keep sats > 92%, down to 50% FiO2 at this point. - See ID section. - KVO IVF. - Lasix additional 20 mg IV x2. - Pulmonary hygiene. - Continue Humibid LA. -  Aggressive mobilization.  CARDIOVASCULAR A:  Sinus Tachycardia - in setting of PNA P:  - Supportive care. - KVO IVF with pulmonary edema. - Lasix as above.  RENAL A:   At risk ATN / PreRenal fx - in setting of CAP P:   - Monitor renal function. - Lasix and K as ordered.  GASTROINTESTINAL A:   NPO P:   - Full diet.  HEMATOLOGIC A:   Anemia - normocytic, IDA vs ACD Thrombocytopenia - in setting of acute illness  P:  - Follow H/H, no evidence of acute blood loss - Will need further work up as outpatient - Iron supplement.  INFECTIOUS A:  CAP - rx'd for 2 days with levaquin as outpt prior to admit Exudative Pleural Effusion - based on protein & LDH P:   - D/C zithromax. - Continue vanc/zosyn x8 days total.  ENDOCRINE A:   Hyperglycemia Hypogonadism   P:   - Continue androgen gel. - Continue SSI.  NEUROLOGIC A:   Pain - secondary to CAP Depression / Bipolar Disorder   P:   - Caution with PRN Narc's - Continue current PSY regimen  Improvement with diureses and abx, will d/c zithromax and continue vanc/zosyn x8 days total since cultures are all negative andp atient remains marginal.  Will hold in the SDU overnight and if continues to improve will consider transfer to tele in AM.  I have personally obtained a history, examined the patient, evaluated laboratory and imaging results, formulated the assessment and plan and placed orders.  11/05/2012, 9:59 AM  Rush Farmer, M.D. [facility] Pulmonary/Critical Care Medicine. Pager: [phone]. After hours pager: [phone].

## 2012-11-06 LAB — CBC
MCH: 29.2 pg (ref 26.0–34.0)
Platelets: 235 10*3/uL (ref 150–400)
RBC: 4.01 MIL/uL — ABNORMAL LOW (ref 4.22–5.81)
WBC: 9 10*3/uL (ref 4.0–10.5)

## 2012-11-06 LAB — CULTURE, BLOOD (ROUTINE X 2): Culture: NO GROWTH

## 2012-11-06 LAB — BASIC METABOLIC PANEL
Calcium: 9.5 mg/dL (ref 8.4–10.5)
GFR calc Af Amer: 79 mL/min — ABNORMAL LOW (ref >90)
GFR calc non Af Amer: 68 mL/min — ABNORMAL LOW (ref >90)
Glucose, Bld: 164 mg/dL — ABNORMAL HIGH (ref 70–99)
Potassium: 4.4 mEq/L (ref 3.5–5.1)
Sodium: 137 mEq/L (ref 135–145)

## 2012-11-06 LAB — MAGNESIUM: Magnesium: 2.4 mg/dL (ref 1.5–2.5)

## 2012-11-06 LAB — BODY FLUID CULTURE

## 2012-11-06 LAB — PHOSPHORUS: Phosphorus: 4.7 mg/dL — ABNORMAL HIGH (ref 2.3–4.6)

## 2012-11-06 MED ORDER — DEXTROSE 5 % IV SOLN
1.0000 g | INTRAVENOUS | Status: DC
  Administered 2012-11-06 – 2012-11-07 (×2): 1 g via INTRAVENOUS
  Filled 2012-11-06 (×3): qty 10

## 2012-11-06 MED ORDER — HYDROCODONE-ACETAMINOPHEN 5-325 MG PO TABS
1.0000 | ORAL_TABLET | Freq: Four times a day (QID) | ORAL | Status: DC | PRN
  Administered 2012-11-06: 2 via ORAL
  Administered 2012-11-06: 1 via ORAL
  Administered 2012-11-06 – 2012-11-07 (×3): 2 via ORAL
  Filled 2012-11-06: qty 2
  Filled 2012-11-06: qty 1
  Filled 2012-11-06 (×3): qty 2

## 2012-11-06 NOTE — Progress Notes (Signed)
PULMONARY  / CRITICAL CARE MEDICINE  Name: [name] MRN: [id number] DOB: 10-28-1965    ADMISSION DATE:  10/31/2012 CONSULTATION DATE:  11/02/12  REFERRING MD :  Dr. Tyrell Antonio PRIMARY SERVICE: [facility]-->PCCM  CHIEF COMPLAINT:  Dyspnea  BRIEF PATIENT DESCRIPTION: 47 y/o WM admitted with LLL PNA.  Decompensated 7/15 with progressive dyspnea & hypoxemia.   SIGNIFICANT EVENTS / STUDIES:  7/13 - Admit with LLL PNA, dyspnea, cough, pleuritic chest pain, fevers 7/15 - Decompensated on floor, tx to ICU, hypoxemia with sats in 50's on [place], L [place] with 1.4 L removed  LINES / TUBES:   CULTURES: 7/13 Sputum >>> rare streptococcus>>>beta hemolytic non group A strep 7/13 BCx2 >>>NEG 7/15 Pleural Fluid>>>Neg   ANTIBIOTICS: Azithro 7/13 >>> Zosyn 7/13>>> Vanco 7/13>>>7/19   SUBJECTIVE:  Much more alert, coughing up mucus.   VITAL SIGNS: Temp:  [98 F (36.7 C)-99.7 F (37.6 C)] 98.8 F (37.1 C) (07/19 0400) Pulse Rate:  [78-109] 78 (07/19 0800) Resp:  [17-33] 18 (07/19 0800) BP: (91-141)/(48-83) 91/48 mmHg (07/19 0800) SpO2:  [89 %-97 %] 95 % (07/19 0832) FiO2 (%):  [35 %-40 %] 35 % (07/18 1040)  HEMODYNAMICS: CV stable    VENTILATOR SETTINGS: Vent Mode:  [-]  FiO2 (%):  [35 %-40 %] 35 % [place] oxygen 3L  INTAKE / OUTPUT: Intake/Output     07/18 0701 - 07/19 0700 07/19 0701 - 07/20 0700   P.O. 200    I.V. (mL/kg)     Other 300 30   IV Piggyback 712.5    Total Intake(mL/kg) 1212.5 (12.1) 30 (0.3)   Urine (mL/kg/hr) 2800 (1.2)    Total Output 2800     Net -1587.5 +30          PHYSICAL EXAMINATION: General:  Young adult male in no distress Neuro:  AAOx4, speech clear, MAE HEENT:  Mm pink/dry, no jvd Cardiovascular:  s1s2 rrr, no m/r/g Lungs:  Decreased rhonchi Abdomen:  Round/soft, bsx4 active Musculoskeletal:  No acute deformities Skin:  LE's with multiple scars, no edema  LABS:  Recent Labs Lab 10/31/12 0200  10/31/12 0226  11/02/12 1035 11/02/12 1255  11/03/12 0340 11/04/12 0328 11/05/12 0340 11/06/12 0415  HGB  --   < >  --   < >  --   --  10.1* 10.6* 11.1* 11.7*  WBC  --   --   --   < >  --   --  9.6 8.5 9.1 9.0  PLT  --   --   --   < >  --   --  205 217 204 235  NA 139  < >  --   < >  --   --  140 139 135  --   K 4.3  < >  --   < >  --   --  3.9 3.7 4.3  --   CL 103  < >  --   < >  --   --  103 100 96  --   CO2 26  --   --   < >  --   --  30 29 29   --   GLUCOSE 209*  < >  --   < >  --   --  163* 166* 165*  --   BUN 20  < >  --   < >  --   --  11 17 25*  --   CREATININE 1.02  < >  --   < >  --   --  1.02 0.94 0.98  --   CALCIUM 9.6  --   --   < >  --   --  8.8 8.9 9.2  --   MG  --   --   --   --   --   --  2.1  --  2.2  --   PHOS  --   --   --   --   --   --   --   --  4.1  --   AST 23  --   --   --   --   --   --   --   --   --   ALT 23  --   --   --   --   --   --   --   --   --   ALKPHOS 92  --   --   --   --   --   --   --   --   --   BILITOT 0.4  --   --   --   --   --   --   --   --   --   PROT 8.1  --   --   --   --  6.3  --   --   --   --   ALBUMIN 3.4*  --   --   --   --   --   --   --   --   --   LATICACIDVEN  --   --  1.55  --   --  1.6  --   --   --   --   TROPONINI <0.30  --   --   --   --   --   --   --   --   --   PHART  --   --   --   --  7.407  --   --   --   --   --   PCO2ART  --   --   --   --  39.1  --   --   --   --   --   PO2ART  --   --   --   --  64.5*  --   --   --   --   --   < > = values in this interval not displayed. No results found for this basename: GLUCAP,  in the last 168 hours  CXR: no film  ASSESSMENT / PLAN:  PULMONARY A: L CAP - rare streptococcus in sputum culture L Pleural Exudative Effusion - s/p thoracentesis 7/15 with 1.5 L removed Pulmonary Edema   Hypoxemia - Pa/FiO2 ratio 64 upon transfer to ICU  P:   - Follow CXR. - Oxygen to keep sats > 92%, down to 50% FiO2 at this point. - See ID section. - KVO IVF. - Pulmonary hygiene. - Continue Humibid LA. - Aggressive  mobilization.  CARDIOVASCULAR A:  Sinus Tachycardia - in setting of PNA P:  - Supportive care. - KVO IVF with pulmonary edema.  RENAL A:   At risk ATN / PreRenal fx - in setting of CAP P:   - Monitor renal function.   GASTROINTESTINAL A:   NPO P:   - Full diet.  HEMATOLOGIC A:   Anemia - normocytic, IDA vs ACD Thrombocytopenia - in setting of acute illness  P:  -  Follow H/H, no evidence of acute blood loss - Will need further work up as outpatient - Iron supplement.  INFECTIOUS A:   CAP - rx'd for 2 days with levaquin as outpt prior to admit Exudative Pleural Effusion - based on protein & LDH P:   -- ok to narrow to rocephin 1gm daily for beta hemolytic strep.  D/c vanco/zosyn Can change to PO ceftin 500mg  bid in 24-48hrs   ENDOCRINE A:   Hyperglycemia Hypogonadism   P:   - Continue androgen gel. - Continue SSI.  NEUROLOGIC A:   Pain - secondary to CAP Depression / Bipolar Disorder   P:   - Caution with PRN Narc's - Continue current PSY regimen  This pt can tfr to tele bed this AM 7/19.  Change to IV rocephin , consider Ceftin 500mg  BID upon d/c x 7days PCCM will SIGN OFF , call again PRN I have personally obtained a history, examined the patient, evaluated laboratory and imaging results, formulated the assessment and plan and placed orders.  Mariel Sleet Beeper  [phone]  Cell  [phone]  If no response or cell goes to voicemail, call beeper [phone]   11/06/2012, 9:04 AM

## 2012-11-06 NOTE — Progress Notes (Signed)
[facility] PROGRESS NOTE  [name] [id number] DOB: [birth year] DOA: 10/31/2012 PCP: [name], MD  Assessment/Plan:  Acute hypoxic Respiratory Failure: Likely related to HCAP+/- narcotic effect. S/p thoracentesis. Appreciate CCM input. Looks much improved today and was able to be titrated to [place].  HCAP- . Abx have been changed to Rocephin. Will likely transition to PO abx in am. Nebulizer Rx. HIV non reactive. sputum culture with rare strep, strep urine negative. CT angio negative for PE.   Anemia-  Looks like AOCD. May benefit from colonoscopy as an OP.  HTN- Well controlled.  DM2- A1C 6.0. Continue SSI.   Hypogonadism- continue Androgen Gel daily.   Thrombocytopenia- Could be secondary to infection. Resolved. 205 on 7/16. 217 7/17.  Depression//Bipolar- continue with currents medications.   DVT prophylaxis: Lovenox.    Code Status: Full  Family Communication: Patient only. Disposition Plan: Transfer to the floor today. Continue PT. Likely DC in aprox 48 hours.   Consultants:  CCM, Dr. Nelda Marseille  Procedures:  Left thoracentesis on 7/15.  Antibiotics:  Vancomycin 7-13-->719  Zosyn 7-13-->719  Azithromycin 7/13-->719  Rocephin 7/19-->  HPI/Subjective: Still feels SOB, altho improved from yesterday.  Objective: Filed Vitals:   11/06/12 0400 11/06/12 0800 11/06/12 0832 11/06/12 1147  BP: 106/65 91/48  102/67  Pulse: 85 78  79  Temp: 98.8 F (37.1 C) 98.9 F (37.2 C)  98.6 F (37 C)  TempSrc: Oral Oral  Oral  Resp:  18  19  Height:      Weight:      SpO2: 94% 93% 95% 95%    Intake/Output Summary (Last 24 hours) at 11/06/12 1202 Last data filed at 11/06/12 1120  Gross per 24 hour  Intake 1222.5 ml  Output   2525 ml  Net -1302.5 ml   Filed Weights   10/31/12 0518 11/03/12 0600  Weight: 103.42 kg (228 lb) 100.4 kg (221 lb 5.5 oz)    Exam:   General:  No distress.   Cardiovascular: S 1, S 2 RRR  Respiratory: Bilateral  crackles ronchus.   Abdomen: BS present, soft, NT  Musculoskeletal: no edema.   Data Reviewed: Basic Metabolic Panel:  Recent Labs Lab 11/01/12 1138 11/02/12 0505 11/03/12 0340 11/04/12 0328 11/05/12 0340  NA 133* 141 140 139 135  K 4.2 4.2 3.9 3.7 4.3  CL 100 104 103 100 96  CO2 26 29 30 29 29   GLUCOSE 315* 170* 163* 166* 165*  BUN 12 12 11 17  25*  CREATININE 0.93 1.06 1.02 0.94 0.98  CALCIUM 8.7 8.7 8.8 8.9 9.2  MG  --   --  2.1  --  2.2  PHOS  --   --   --   --  4.1   Liver Function Tests:  Recent Labs Lab 10/31/12 0200 11/02/12 1255  AST 23  --   ALT 23  --   ALKPHOS 92  --   BILITOT 0.4  --   PROT 8.1 6.3  ALBUMIN 3.4*  --    No results found for this basename: LIPASE, AMYLASE,  in the last 168 hours No results found for this basename: AMMONIA,  in the last 168 hours CBC:  Recent Labs Lab 10/31/12 0340  11/02/12 0505 11/03/12 0340 11/04/12 0328 11/05/12 0340 11/06/12 0415  WBC 9.2  < > 10.6* 9.6 8.5 9.1 9.0  NEUTROABS 7.5  --   --   --   --   --   --   HGB 9.3*  < >  10.9* 10.1* 10.6* 11.1* 11.7*  HCT 26.4*  < > 31.8* 29.4* 31.1* 33.1* 34.6*  MCV 84.6  < > 86.4 86.2 86.1 86.2 86.3  PLT 125*  < > 216 205 217 204 235  < > = values in this interval not displayed. Cardiac Enzymes:  Recent Labs Lab 10/31/12 0200  TROPONINI <0.30   BNP (last 3 results) No results found for this basename: PROBNP,  in the last 8760 hours CBG: No results found for this basename: GLUCAP,  in the last 168 hours  Recent Results (from the past 240 hour(s))  CULTURE, BLOOD (ROUTINE X 2)     Status: None   Collection Time    10/31/12  2:35 AM      Result Value Range Status   Specimen Description BLOOD LEFT ANTECUBITAL   Final   Special Requests BOTTLES DRAWN AEROBIC AND ANAEROBIC 5CC   Final   Culture  Setup Time 10/31/2012 15:14   Final   Culture NO GROWTH 5 DAYS   Final   Report Status 11/06/2012 FINAL   Final  CULTURE, BLOOD (ROUTINE X 2)     Status: None    Collection Time    10/31/12  2:40 AM      Result Value Range Status   Specimen Description BLOOD RIGHT FOREARM   Final   Special Requests BOTTLES DRAWN AEROBIC AND ANAEROBIC 5CC   Final   Culture  Setup Time 10/31/2012 15:15   Final   Culture NO GROWTH 5 DAYS   Final   Report Status 11/06/2012 FINAL   Final  CULTURE, RESPIRATORY (NON-EXPECTORATED)     Status: None   Collection Time    10/31/12 10:00 AM      Result Value Range Status   Specimen Description SPUTUM   Final   Special Requests NONE   Final   Gram Stain     Final   Value: RARE WBC PRESENT, PREDOMINANTLY PMN     NO SQUAMOUS EPITHELIAL CELLS SEEN     RARE GRAM NEGATIVE RODS     RARE YEAST   Culture RARE STREPTOCOCCUS,BETA HEMOLYIC NOT GROUP A   Final   Report Status 11/02/2012 FINAL   Final  CULTURE, EXPECTORATED SPUTUM-ASSESSMENT     Status: None   Collection Time    10/31/12 10:02 AM      Result Value Range Status   Specimen Description SPU   Final   Special Requests Normal   Final   Sputum evaluation     Final   Value: THIS SPECIMEN IS ACCEPTABLE. RESPIRATORY CULTURE REPORT TO FOLLOW.   Report Status 10/31/2012 FINAL   Final  MRSA PCR SCREENING     Status: None   Collection Time    11/02/12 11:02 AM      Result Value Range Status   MRSA by PCR NEGATIVE  NEGATIVE Final   Comment:            The GeneXpert MRSA Assay (FDA     approved for NASAL specimens     only), is one component of a     comprehensive MRSA colonization     surveillance program. It is not     intended to diagnose MRSA     infection nor to guide or     monitor treatment for     MRSA infections.  AFB CULTURE WITH SMEAR     Status: None   Collection Time    11/02/12 12:02 PM      Result  Value Range Status   Specimen Description LUNG PLEURAL   Final   Special Requests NONE   Final   ACID FAST SMEAR NO ACID FAST BACILLI SEEN   Final   Culture     Final   Value: CULTURE WILL BE EXAMINED FOR 6 WEEKS BEFORE ISSUING A FINAL REPORT   Report  Status PENDING   Incomplete  BODY FLUID CULTURE     Status: None   Collection Time    11/02/12 12:02 PM      Result Value Range Status   Specimen Description PLEURAL   Final   Special Requests NONE   Final   Gram Stain     Final   Value: RARE WBC PRESENT, PREDOMINANTLY PMN     NO ORGANISMS SEEN   Culture NO GROWTH 3 DAYS   Final   Report Status 11/06/2012 FINAL   Final  FUNGUS CULTURE W SMEAR     Status: None   Collection Time    11/02/12 12:02 PM      Result Value Range Status   Specimen Description PLEURAL   Final   Special Requests Normal   Final   Fungal Smear NO YEAST OR FUNGAL ELEMENTS SEEN   Final   Culture CULTURE IN PROGRESS FOR FOUR WEEKS   Final   Report Status PENDING   Incomplete     Studies: Dg Chest Port 1 View  11/05/2012   *RADIOLOGY REPORT*  Clinical Data: Pneumonia  PORTABLE CHEST - 1 VIEW  Comparison: 11/04/2012; 10/31/2012; chest CT - 10/31/2012  Findings: Grossly unchanged cardiac silhouette and mediastinal contours.  The pulmonary vasculature remains indistinct with cephalization of flow.  Unchanged possibly partially loculated small left-sided pleural effusion.  No definite right-sided pleural effusion.  There is a small amount of fluid layering within the right minor fissure, unchanged.  Grossly unchanged bibasilar heterogeneous air space opacities, left greater than right.  No pneumothorax.  Unchanged bones.  IMPRESSION: Grossly unchanged findings of pulmonary edema with small left-sided effusion and associated left basilar air space disease.  A follow- up chest radiograph in 4 to 6 weeks after treatment is recommended to ensure resolution.   Original Report Authenticated By: Jake Seats, MD    Scheduled Meds: . albuterol  2.5 mg Nebulization TID  . antiseptic oral rinse  15 mL Mouth Rinse BID  . cefTRIAXone (ROCEPHIN)  IV  1 g Intravenous Q24H  . cholecalciferol  2,000 Units Oral q morning - 10a  . clonazePAM  1 mg Oral TID  . cyanocobalamin  500 mcg Oral  q morning - 10a  . divalproex  500 mg Oral QHS  . enoxaparin (LOVENOX) injection  40 mg Subcutaneous Q24H  . ferrous sulfate  325 mg Oral BID WC  . guaiFENesin  1,200 mg Oral BID  . lisdexamfetamine  20 mg Oral Q24H  . multivitamin with minerals  1 tablet Oral q morning - 10a  . nicotine  14 mg Transdermal Daily  . QUEtiapine  400 mg Oral QHS  . sodium chloride  3 mL Intravenous Q12H  . testosterone  5 g Transdermal Daily   Continuous Infusions:    Principal Problem:   HCAP (healthcare-associated pneumonia) Active Problems:   BIPOLAR DISORDER UNSPECIFIED   DEPRESSION   HYPERTENSION   HYPOGONADISM   TOBACCO USER   Diabetes type 2, controlled   Anemia   Acute respiratory failure   Pneumonia, organism unspecified   Pleural effusion   Hypoxemia    Time spent:  35 minutes.     [facility] Pager [phone]. If 7PM-7AM, please contact night-coverage at [email or web address], password [email or web address] 11/06/2012, 12:02 PM  LOS: 6 days

## 2012-11-07 LAB — CBC
Hemoglobin: 11.9 g/dL — ABNORMAL LOW (ref 13.0–17.0)
MCHC: 35 g/dL (ref 30.0–36.0)
RDW: 12.2 % (ref 11.5–15.5)
WBC: 8.6 10*3/uL (ref 4.0–10.5)

## 2012-11-07 LAB — BASIC METABOLIC PANEL
BUN: 32 mg/dL — ABNORMAL HIGH (ref 6–23)
GFR calc Af Amer: >90 mL/min (ref >90)
GFR calc non Af Amer: 90 mL/min — ABNORMAL LOW (ref >90)
Potassium: 3.8 mEq/L (ref 3.5–5.1)

## 2012-11-07 MED ORDER — BUPROPION HCL ER (SR) 150 MG PO TB12
150.0000 mg | ORAL_TABLET | Freq: Three times a day (TID) | ORAL | Status: DC
  Administered 2012-11-07 – 2012-11-08 (×2): 150 mg via ORAL
  Filled 2012-11-07 (×4): qty 1

## 2012-11-07 NOTE — Progress Notes (Signed)
[facility] PROGRESS NOTE  [name] [id number] DOB: [birth year] DOA: 10/31/2012 PCP: [name], MD  Assessment/Plan:  Acute hypoxic Respiratory Failure: Likely related to HCAP+/- narcotic effect. S/p thoracentesis. . Looks much improved today. Still has minimal oxygen requirements.  HCAP- . Abx have been changed to Rocephin. Will likely transition to PO abx in am. Nebulizer Rx. HIV non reactive. sputum culture with rare strep, strep urine negative. CT angio negative for PE.   Anemia-  Looks like AOCD. May benefit from colonoscopy as an OP.  HTN- Well controlled.  DM2- A1C 6.0. Continue SSI.   Hypogonadism- continue Androgen Gel daily.   Thrombocytopenia- Could be secondary to infection. Resolved. 205 on 7/16. 217 7/17.  Depression//Bipolar- continue with currents medications.   DVT prophylaxis: Lovenox.    Code Status: Full  Family Communication: Patient only. Disposition Plan: . Continue PT. Likely DC home in aprox 24-48 hours.   Consultants:  CCM, Dr. Nelda Marseille  Procedures:  Left thoracentesis on 7/15.  Antibiotics:  Vancomycin 7-13-->719  Zosyn 7-13-->719  Azithromycin 7/13-->719  Rocephin 7/19-->  HPI/Subjective: Still feels SOB, altho improved from yesterday.  Objective: Filed Vitals:   11/06/12 2120 11/06/12 2148 11/07/12 0600 11/07/12 0903  BP: 125/61  111/69   Pulse: 84  78   Temp: 98.3 F (36.8 C)  97.9 F (36.6 C)   TempSrc: Oral  Oral   Resp: 16  18   Height:      Weight:      SpO2: 97% 91% 95% 92%    Intake/Output Summary (Last 24 hours) at 11/07/12 1348 Last data filed at 11/07/12 1300  Gross per 24 hour  Intake    530 ml  Output    500 ml  Net     30 ml   Filed Weights   10/31/12 0518 11/03/12 0600  Weight: 103.42 kg (228 lb) 100.4 kg (221 lb 5.5 oz)    Exam:   General:  No distress.   Cardiovascular: S 1, S 2 RRR  Respiratory: Bilateral crackles ronchus.   Abdomen: BS present, soft,  NT  Musculoskeletal: no edema.   Data Reviewed: Basic Metabolic Panel:  Recent Labs Lab 11/03/12 0340 11/04/12 0328 11/05/12 0340 11/06/12 0415 11/07/12 0448  NA 140 139 135 137 136  K 3.9 3.7 4.3 4.4 3.8  CL 103 100 96 93* 95*  CO2 30 29 29  33* 33*  GLUCOSE 163* 166* 165* 164* 133*  BUN 11 17 25* 30* 32*  CREATININE 1.02 0.94 0.98 1.26 1.00  CALCIUM 8.8 8.9 9.2 9.5 9.5  MG 2.1  --  2.2 2.4  --   PHOS  --   --  4.1 4.7*  --    Liver Function Tests:  Recent Labs Lab 11/02/12 1255  PROT 6.3   No results found for this basename: LIPASE, AMYLASE,  in the last 168 hours No results found for this basename: AMMONIA,  in the last 168 hours CBC:  Recent Labs Lab 11/03/12 0340 11/04/12 0328 11/05/12 0340 11/06/12 0415 11/07/12 0448  WBC 9.6 8.5 9.1 9.0 8.6  HGB 10.1* 10.6* 11.1* 11.7* 11.9*  HCT 29.4* 31.1* 33.1* 34.6* 34.0*  MCV 86.2 86.1 86.2 86.3 85.2  PLT 205 217 204 235 265   Cardiac Enzymes: No results found for this basename: CKTOTAL, CKMB, CKMBINDEX, TROPONINI,  in the last 168 hours BNP (last 3 results) No results found for this basename: PROBNP,  in the last 8760 hours CBG: No results found for this basename:  GLUCAP,  in the last 168 hours  Recent Results (from the past 240 hour(s))  CULTURE, BLOOD (ROUTINE X 2)     Status: None   Collection Time    10/31/12  2:35 AM      Result Value Range Status   Specimen Description BLOOD LEFT ANTECUBITAL   Final   Special Requests BOTTLES DRAWN AEROBIC AND ANAEROBIC 5CC   Final   Culture  Setup Time 10/31/2012 15:14   Final   Culture NO GROWTH 5 DAYS   Final   Report Status 11/06/2012 FINAL   Final  CULTURE, BLOOD (ROUTINE X 2)     Status: None   Collection Time    10/31/12  2:40 AM      Result Value Range Status   Specimen Description BLOOD RIGHT FOREARM   Final   Special Requests BOTTLES DRAWN AEROBIC AND ANAEROBIC 5CC   Final   Culture  Setup Time 10/31/2012 15:15   Final   Culture NO GROWTH 5 DAYS    Final   Report Status 11/06/2012 FINAL   Final  CULTURE, RESPIRATORY (NON-EXPECTORATED)     Status: None   Collection Time    10/31/12 10:00 AM      Result Value Range Status   Specimen Description SPUTUM   Final   Special Requests NONE   Final   Gram Stain     Final   Value: RARE WBC PRESENT, PREDOMINANTLY PMN     NO SQUAMOUS EPITHELIAL CELLS SEEN     RARE GRAM NEGATIVE RODS     RARE YEAST   Culture RARE STREPTOCOCCUS,BETA HEMOLYIC NOT GROUP A   Final   Report Status 11/02/2012 FINAL   Final  CULTURE, EXPECTORATED SPUTUM-ASSESSMENT     Status: None   Collection Time    10/31/12 10:02 AM      Result Value Range Status   Specimen Description SPU   Final   Special Requests Normal   Final   Sputum evaluation     Final   Value: THIS SPECIMEN IS ACCEPTABLE. RESPIRATORY CULTURE REPORT TO FOLLOW.   Report Status 10/31/2012 FINAL   Final  MRSA PCR SCREENING     Status: None   Collection Time    11/02/12 11:02 AM      Result Value Range Status   MRSA by PCR NEGATIVE  NEGATIVE Final   Comment:            The GeneXpert MRSA Assay (FDA     approved for NASAL specimens     only), is one component of a     comprehensive MRSA colonization     surveillance program. It is not     intended to diagnose MRSA     infection nor to guide or     monitor treatment for     MRSA infections.  AFB CULTURE WITH SMEAR     Status: None   Collection Time    11/02/12 12:02 PM      Result Value Range Status   Specimen Description LUNG PLEURAL   Final   Special Requests NONE   Final   ACID FAST SMEAR NO ACID FAST BACILLI SEEN   Final   Culture     Final   Value: CULTURE WILL BE EXAMINED FOR 6 WEEKS BEFORE ISSUING A FINAL REPORT   Report Status PENDING   Incomplete  BODY FLUID CULTURE     Status: None   Collection Time    11/02/12 12:02 PM  Result Value Range Status   Specimen Description PLEURAL   Final   Special Requests NONE   Final   Gram Stain     Final   Value: RARE WBC PRESENT,  PREDOMINANTLY PMN     NO ORGANISMS SEEN   Culture NO GROWTH 3 DAYS   Final   Report Status 11/06/2012 FINAL   Final  FUNGUS CULTURE W SMEAR     Status: None   Collection Time    11/02/12 12:02 PM      Result Value Range Status   Specimen Description PLEURAL   Final   Special Requests Normal   Final   Fungal Smear NO YEAST OR FUNGAL ELEMENTS SEEN   Final   Culture CULTURE IN PROGRESS FOR FOUR WEEKS   Final   Report Status PENDING   Incomplete     Studies: No results found.  Scheduled Meds: . albuterol  2.5 mg Nebulization TID  . antiseptic oral rinse  15 mL Mouth Rinse BID  . cefTRIAXone (ROCEPHIN)  IV  1 g Intravenous Q24H  . cholecalciferol  2,000 Units Oral q morning - 10a  . clonazePAM  1 mg Oral TID  . cyanocobalamin  500 mcg Oral q morning - 10a  . divalproex  500 mg Oral QHS  . enoxaparin (LOVENOX) injection  40 mg Subcutaneous Q24H  . ferrous sulfate  325 mg Oral BID WC  . guaiFENesin  1,200 mg Oral BID  . lisdexamfetamine  20 mg Oral Q24H  . multivitamin with minerals  1 tablet Oral q morning - 10a  . nicotine  14 mg Transdermal Daily  . QUEtiapine  400 mg Oral QHS  . sodium chloride  3 mL Intravenous Q12H  . testosterone  5 g Transdermal Daily   Continuous Infusions:    Principal Problem:   HCAP (healthcare-associated pneumonia) Active Problems:   BIPOLAR DISORDER UNSPECIFIED   DEPRESSION   HYPERTENSION   HYPOGONADISM   TOBACCO USER   Diabetes type 2, controlled   Anemia   Acute respiratory failure   Pneumonia, organism unspecified   Pleural effusion   Hypoxemia    Time spent: 25 minutes.     [facility] Pager [phone]. If 7PM-7AM, please contact night-coverage at [email or web address], password [facility] 11/07/2012, 1:48 PM  LOS: 7 days

## 2012-11-07 NOTE — Progress Notes (Signed)
Physical Therapy Treatment Patient Details Name: [name] MRN: [id number] DOB: Mar 17, 1966 Today's Date: 11/07/2012 Time: [id number] PT Time Calculation (min): 17 min  PT Assessment / Plan / Recommendation  PT Comments   Pt progressing with mobility, however continues to require use of RW and demonstrates decreased endurance.  Ambulation on RA during session with SaO2 dropping to 89% at lowest, however pt able to brings sats up to 92% with rest and pursed lip breathing.   Follow Up Recommendations  Home health PT     Does the patient have the potential to tolerate intense rehabilitation     Barriers to Discharge        Equipment Recommendations  Rolling walker with 5" wheels    Recommendations for Other Services    Frequency Min 3X/week   Progress towards PT Goals Progress towards PT goals: Progressing toward goals  Plan Current plan remains appropriate    Precautions / Restrictions Precautions Precautions: Fall Precaution Comments: hx of bil hip arthritis and back surgery, monitor O2 Restrictions Weight Bearing Restrictions: No   Pertinent Vitals/Pain Pt c/o pain in hips    Mobility  Bed Mobility Bed Mobility: Supine to Sit;Sit to Supine Supine to Sit: 5: Supervision Sit to Supine: 5: Supervision Details for Bed Mobility Assistance: Supervision for safety with min cues for technique.  Transfers Transfers: Sit to Stand;Stand to Sit Sit to Stand: 4: Min guard;From bed Stand to Sit: 5: Supervision;To bed Details for Transfer Assistance: min/guard for safety with min cues for hand placement Ambulation/Gait Ambulation/Gait Assistance: 4: Min assist Ambulation Distance (Feet): 120 Feet (then another [place] device: Rolling walker;None Ambulation/Gait Assistance Details: Ambulated initially without RW with pt demonstrating forward flexed posture, decreased stride length and intermittent LOB.  Transitioned to amb with RW and note improved gait pattern and improved  balance.  Continues to require cues for upright posture and pursed lip breathing.  Ambulated on RA throughout session with SaO2 at 89% at lowest and 94% at highest.  RN notified.  Gait Pattern: Step-through pattern;Step-to pattern;Decreased stride length;Decreased step length - right;Decreased step length - left Gait velocity: decreased    Exercises     PT Diagnosis:    PT Problem List:   PT Treatment Interventions:     PT Goals (current goals can now be found in the care plan section) Acute Rehab PT Goals Patient Stated Goal: breathe better. regain independence PT Goal Formulation: With patient Time For Goal Achievement: 11/19/12 Potential to Achieve Goals: Good  Visit Information  Last PT Received On: 11/07/12 Assistance Needed: +1    Subjective Data  Subjective: My hip still hurts Patient Stated Goal: breathe better. regain independence   Cognition  Cognition Arousal/Alertness: Awake/alert Behavior During Therapy: WFL for tasks assessed/performed Overall Cognitive Status: Within Functional Limits for tasks assessed    Balance     End of Session PT - End of Session Equipment Utilized During Treatment: Gait belt Activity Tolerance: Patient limited by fatigue Patient left: in bed;with call bell/phone within reach Nurse Communication: Need for lift equipment;Other (comment) (O2 sats)   GP     Denice Bors 11/07/2012, 10:21 AM

## 2012-11-08 MED ORDER — LEVOFLOXACIN 750 MG PO TABS: 750.0000 mg | ORAL_TABLET | Freq: Every day | ORAL | Status: DC

## 2012-11-08 MED ORDER — CLONAZEPAM 1 MG PO TABS: 1.0000 mg | ORAL_TABLET | Freq: Three times a day (TID) | ORAL | Status: DC

## 2012-11-08 MED ORDER — FERROUS SULFATE 325 (65 FE) MG PO TABS: 325.0000 mg | ORAL_TABLET | Freq: Two times a day (BID) | ORAL | Status: DC

## 2012-11-08 NOTE — Progress Notes (Signed)
SATURATION QUALIFICATIONS:  Patient Saturations on Room Air at Rest = 96  Patient Saturations on [employer] while Ambulating = 97  Patient Saturations on Room Air while Ambulating = 97  Please briefly explain why patient needs home oxygen:

## 2012-11-08 NOTE — Discharge Summary (Signed)
Physician Discharge Summary  [name] [id number] DOB: Jun 18, 1965 DOA: 10/31/2012  PCP: [name], MD  Admit date: 10/31/2012 Discharge date: 11/08/2012  Time spent: Greater than 30 minutes  Recommendations for Outpatient Follow-up:  -Has been set up for the [facility] on 7/25. -Would recommend a repeat CXR in 4-6 weeks to ensure complete resolution of his PNA.   Discharge Diagnoses:  Principal Problem:   HCAP (healthcare-associated pneumonia) Active Problems:   BIPOLAR DISORDER UNSPECIFIED   DEPRESSION   HYPERTENSION   HYPOGONADISM   TOBACCO USER   Diabetes type 2, controlled   Anemia   Acute respiratory failure   Pneumonia, organism unspecified   Pleural effusion   Hypoxemia   Discharge Condition: Stable and improved  Filed Weights   10/31/12 0518 11/03/12 0600  Weight: 103.42 kg (228 lb) 100.4 kg (221 lb 5.5 oz)    History of present illness:  Patient is a 47 y.o. man who had been seen in th ED 2 days ago and was diagnosed with a LLL Pneumonia and placed on Levaquin to continue as an outpatient who returns with complaints of worsening sob and Cough and pleuritic chest pain. He reports coughing up yellowish sputum, and reports having fever and chills off and on. His pain is in his left side and is worst with deep breaths or coughing. He was evalauted in the ED, and was found to have worsening of his PNA on a CTA of the Chest which was negative for pulmonary Emboli. He was placed on IV antibiotic therapy of Vancomycin and Zosyn and referred for medical admission.   Hospital Course:   Acute hypoxic Respiratory Failure: Likely related to HCAP+/- narcotic effect. S/p thoracentesis. . Looks much improved today. No longer has any oxygen requirements.  HCAP- .Will be discharged on 5 more days of PO levaquin. Respiratory cx only grew rare strep.  Anemia- Looks like AOCD. May benefit from colonoscopy as an OP.   HTN- Well controlled.   DM2- A1C 6.0.  Continue SSI.   Hypogonadism- continue Androgen Gel daily.   Thrombocytopenia- Could be secondary to infection. Resolved. 205 on 7/16. 217 7/17.   Depression//Bipolar- continue with currents medications.     Consultations:  CCM, Dr. Nelda Marseille  Discharge Instructions  Discharge Orders   Future Appointments Provider Department Dept Phone   11/10/2012 2:30 PM [name], MD [facility] [place] [phone]   11/12/2012 9:15 AM [facility] Covering Provider [place] [phone]   Future Orders Complete By Expires     Discontinue IV  As directed     Increase activity slowly  As directed         Medication List    STOP taking these medications       celecoxib 200 MG capsule  Commonly known as:  CELEBREX     ibuprofen 200 MG tablet  Commonly known as:  ADVIL,MOTRIN      TAKE these medications       buPROPion 150 MG 12 hr tablet  Commonly known as:  WELLBUTRIN SR  Take 150 mg by mouth 3 (three) times daily.     cholecalciferol 1000 UNITS tablet  Commonly known as:  VITAMIN D  Take 2,000 Units by mouth every morning.     clonazePAM 1 MG tablet  Commonly known as:  KLONOPIN  Take 1 tablet (1 mg total) by mouth 3 (three) times daily.     cyanocobalamin 500 MCG tablet  Take 500 mcg  by mouth every morning.     divalproex 250 MG 24 hr tablet  Commonly known as:  DEPAKOTE ER  Take 500 mg by mouth at bedtime.     ferrous sulfate 325 (65 FE) MG tablet  Take 1 tablet (325 mg total) by mouth 2 (two) times daily with a meal.     GLUCOSAMINE MSM COMPLEX PO  Take 2 capsules by mouth 2 (two) times daily.     HYDROcodone-acetaminophen 10-325 MG per tablet  Commonly known as:  NORCO  Take 0.5-1 tablets by mouth every 8 (eight) hours as needed for pain.     levofloxacin 750 MG tablet  Commonly known as:  LEVAQUIN  Take 1 tablet (750 mg total) by mouth daily. For 5 days     lisdexamfetamine 20 MG capsule  Commonly  known as:  VYVANSE  Take 20 mg by mouth every morning.     multivitamin with minerals Tabs  Take 1 tablet by mouth every morning.     QUEtiapine 200 MG 24 hr tablet  Commonly known as:  SEROQUEL XR  Take 400 mg by mouth at bedtime.     tadalafil 5 MG tablet  Commonly known as:  CIALIS  Take 1 tablet (5 mg total) by mouth daily as needed.     Testosterone 20.25 MG/1.25GM (1.62%) Gel  Commonly known as:  ANDROGEL  Place 3 application onto the skin daily.       No Known Allergies     Follow-up Information   Follow up On 11/12/2012. (9:15a/bring photo id,$20 co pay,medicines in a bottle.)    Contact information:   [place] [address] [place] [address] [phone]       The results of significant diagnostics from this hospitalization (including imaging, microbiology, ancillary and laboratory) are listed below for reference.    Significant Diagnostic Studies: Dg Chest 1 View  11/02/2012   *RADIOLOGY REPORT*  Clinical Data: Shortness of breath, hypoxia  CHEST - 1 VIEW  Comparison: 10/31/2012  Findings: There is moderate left pleural effusion slight increase from prior exam with left lower lobe atelectasis or infiltrate. Mild interstitial prominence bilaterally.  Superimposed mild interstitial edema cannot be excluded.  IMPRESSION: There is moderate left pleural effusion slight increase from prior exam with left lower lobe atelectasis or infiltrate.  Mild interstitial prominence bilaterally.  Superimposed mild interstitial edema cannot be excluded.   Original Report Authenticated By: Lahoma Crocker, M.D.   Dg Chest 2 View  10/31/2012   *RADIOLOGY REPORT*  Clinical Data: Chest pain and cough.  CHEST - 2 VIEW  Comparison: 10/28/2012.  Findings: Normal sized heart.  Increased patchy and linear opacity at the left lung base.  Moderate-sized left pleural effusion, increased.  Stable mild diffuse peribronchial thickening.  The heart remains grossly normal in size.   Positional scoliosis.  IMPRESSION:  1.  Increased left basilar pneumonia and atelectasis. 2.  Moderate-sized left pleural effusion, increased. 3.  Stable mild bronchitic changes.   Original Report Authenticated By: Claudie Revering, M.D.   Dg Chest 2 View  10/28/2012   *RADIOLOGY REPORT*  Clinical Data: Chest pain with cough and fever  CHEST - 2 VIEW  Comparison: April 29, 2007  Findings:  There is a focal consolidation in the lateral left base. Elsewhere, lungs clear.  Heart size and pulmonary vascularity are normal.  No adenopathy.  No bone lesions. No pneumothorax.  IMPRESSION: Consolidation lateral left base.  Followup study in 7 to 10 days  is advised to assess for clearing. If this area of consolidation persists on 7-day-10 day follow-up examination, chest CT at that time would be advised to exclude the possibility of underlying mass.  Elsewhere, lungs clear.   Original Report Authenticated By: Lowella Grip, M.D.   Ct Angio Chest Pe W/cm &/or Wo Cm  10/31/2012   *RADIOLOGY REPORT*  Clinical Data: Recent diagnosis of pneumonia.  Pain in the extremities.  History of osteoarthritis.  CT ANGIOGRAPHY CHEST  Technique:  Multidetector CT imaging of the chest using the standard protocol during bolus administration of intravenous contrast. Multiplanar reconstructed images including MIPs were obtained and reviewed to evaluate the vascular anatomy.  Contrast: 148mL OMNIPAQUE IOHEXOL 350 MG/ML SOLN  Comparison: None.  Findings: Technically adequate study with moderately good opacification of the central and proximal segmental pulmonary arteries.  Distal segmental and peripheral vessels are not well opacified and poorly visualized.  No focal filling defect to suggest significant central pulmonary embolus.  Normal heart size.  Normal caliber thoracic aorta.  Scattered mediastinal lymph nodes are not pathologically enlarged.  Esophagus is decompressed.  Visualized portions of the upper abdominal organs are unremarkable.   Thyroid gland is homogeneous.  There is a small to moderate-sized left pleural effusion with consolidation demonstrated in the left lower lung and lingula and focal patchy areas of consolidation in the right lung base and throughout the remainder the right lung.  Changes likely represent multifocal pneumonia.  Airways appear patent.  No pneumothorax. Emphysematous changes in the lung apices.  Degenerative changes in the thoracic spine.  IMPRESSION: No evidence of central pulmonary embolus although the contrast bolus is not sufficient to exclude emboli in the peripheral vessels.  Left pleural effusion with consolidation in the left lung base.  Patchy areas of infiltration in the right lung. Changes are likely represent multifocal pneumonia.   Original Report Authenticated By: Lucienne Capers, M.D.   Dg Chest Port 1 View  11/05/2012   *RADIOLOGY REPORT*  Clinical Data: Pneumonia  PORTABLE CHEST - 1 VIEW  Comparison: 11/04/2012; 10/31/2012; chest CT - 10/31/2012  Findings: Grossly unchanged cardiac silhouette and mediastinal contours.  The pulmonary vasculature remains indistinct with cephalization of flow.  Unchanged possibly partially loculated small left-sided pleural effusion.  No definite right-sided pleural effusion.  There is a small amount of fluid layering within the right minor fissure, unchanged.  Grossly unchanged bibasilar heterogeneous air space opacities, left greater than right.  No pneumothorax.  Unchanged bones.  IMPRESSION: Grossly unchanged findings of pulmonary edema with small left-sided effusion and associated left basilar air space disease.  A follow- up chest radiograph in 4 to 6 weeks after treatment is recommended to ensure resolution.   Original Report Authenticated By: Jake Seats, MD   Dg Chest Port 1 View  11/04/2012   *RADIOLOGY REPORT*  Clinical Data: Airspace disease, follow-up  PORTABLE CHEST - 1 VIEW  Comparison: Portable chest x-ray of 11/03/2012  Findings: No endotracheal  tube is seen.  There is little change in aeration, with no change in diffuse airspace disease.  There may be a small left effusion present.  Cardiomegaly is stable.  IMPRESSION: No significant change in diffuse airspace disease and poor aeration.   Original Report Authenticated By: Ivar Drape, M.D.   Dg Chest Port 1 View  11/03/2012   *RADIOLOGY REPORT*  Clinical Data: Evaluate endotracheal tube and pleural effusion  PORTABLE CHEST - 1 VIEW  Comparison: Portable chest x-ray of 11/02/2012  Findings: There is little change in  diffuse airspace disease and aeration.  Left basilar opacities consistent atelectasis and possible small left effusion.  Heart size is stable.  IMPRESSION: No significant change in diffuse airspace disease.  Left basilar opacity consistent with atelectasis and small effusion.   Original Report Authenticated By: Ivar Drape, M.D.   Dg Chest Port 1 View  11/02/2012   *RADIOLOGY REPORT*  Clinical Data: Thoracentesis.  PORTABLE CHEST - 1 VIEW  Comparison: 11/02/2012 at 12:16 p.m.  Findings: The pneumothorax component of the previously seen left hydropneumothorax is no longer apparent.  This may be due to resolution or may be due to the semi erect positioning.  Low lung volumes noted with cardiomegaly and bilateral interstitial edema.  Patchy airspace opacity at the left lung base adjacent to the moderate size left effusion.  IMPRESSION:  1.  Pneumothorax component of the left hydropneumothorax not readily apparent, possibly from resolution or obscuration from semi erect positioning. 2.  Stable left pleural effusion with airspace opacity left lung base. 3.  Stable interstitial opacity bilaterally, right greater than left.   Original Report Authenticated By: Van Clines, M.D.   Dg Chest Port 1 View  11/02/2012   **ADDENDUM** CREATED: 11/02/2012 12:45:21  These results were called by telephone on 11/02/2012 at 12:45 p.m. to nurse Amy (for Dr. Nelda Marseille), who verbally acknowledged these  results.  **END ADDENDUM** SIGNED BY: Etheleen Mayhew, M.D.  11/02/2012   *RADIOLOGY REPORT*  Clinical Data: Status post thoracentesis.  PORTABLE CHEST - 1 VIEW  Comparison: Chest x-ray 11/02/2012.  Findings: Compared to the prior examination there has been interval reduction in size of the left-sided pleural effusion, now only moderate in size.  There is a new small left-sided pneumothorax. Notably, the residual left pleural fluid does not form a horizontal line (typically seen in the setting of hydropneumothorax), suggesting some degree of complexity and loculation.  Extensive multifocal interstitial and airspace disease throughout the right lung and left base is again noted.  Heart size is within normal limits. The patient is rotated to the right on today's exam, resulting in distortion of the mediastinal contours and reduced diagnostic sensitivity and specificity for mediastinal pathology.  IMPRESSION: 1.  Decreased size of left sided pleural effusion following thoracentesis.  There is now a left hydropneumothorax (only a small pneumothorax component), with apparent loculations of the fluid component of hydropneumothorax. 2.  Multifocal interstitial and airspace disease in the lungs bilaterally (right greater than left), concerning for multilobar pneumonia.   Original Report Authenticated By: Vinnie Langton, M.D.    Microbiology: Recent Results (from the past 240 hour(s))  CULTURE, BLOOD (ROUTINE X 2)     Status: None   Collection Time    10/31/12  2:35 AM      Result Value Range Status   Specimen Description BLOOD LEFT ANTECUBITAL   Final   Special Requests BOTTLES DRAWN AEROBIC AND ANAEROBIC 5CC   Final   Culture  Setup Time 10/31/2012 15:14   Final   Culture NO GROWTH 5 DAYS   Final   Report Status 11/06/2012 FINAL   Final  CULTURE, BLOOD (ROUTINE X 2)     Status: None   Collection Time    10/31/12  2:40 AM      Result Value Range Status   Specimen Description BLOOD RIGHT FOREARM    Final   Special Requests BOTTLES DRAWN AEROBIC AND ANAEROBIC 5CC   Final   Culture  Setup Time 10/31/2012 15:15   Final   Culture NO GROWTH 5  DAYS   Final   Report Status 11/06/2012 FINAL   Final  CULTURE, RESPIRATORY (NON-EXPECTORATED)     Status: None   Collection Time    10/31/12 10:00 AM      Result Value Range Status   Specimen Description SPUTUM   Final   Special Requests NONE   Final   Gram Stain     Final   Value: RARE WBC PRESENT, PREDOMINANTLY PMN     NO SQUAMOUS EPITHELIAL CELLS SEEN     RARE GRAM NEGATIVE RODS     RARE YEAST   Culture RARE STREPTOCOCCUS,BETA HEMOLYIC NOT GROUP A   Final   Report Status 11/02/2012 FINAL   Final  CULTURE, EXPECTORATED SPUTUM-ASSESSMENT     Status: None   Collection Time    10/31/12 10:02 AM      Result Value Range Status   Specimen Description SPU   Final   Special Requests Normal   Final   Sputum evaluation     Final   Value: THIS SPECIMEN IS ACCEPTABLE. RESPIRATORY CULTURE REPORT TO FOLLOW.   Report Status 10/31/2012 FINAL   Final  MRSA PCR SCREENING     Status: None   Collection Time    11/02/12 11:02 AM      Result Value Range Status   MRSA by PCR NEGATIVE  NEGATIVE Final   Comment:            The GeneXpert MRSA Assay (FDA     approved for NASAL specimens     only), is one component of a     comprehensive MRSA colonization     surveillance program. It is not     intended to diagnose MRSA     infection nor to guide or     monitor treatment for     MRSA infections.  AFB CULTURE WITH SMEAR     Status: None   Collection Time    11/02/12 12:02 PM      Result Value Range Status   Specimen Description LUNG PLEURAL   Final   Special Requests NONE   Final   ACID FAST SMEAR NO ACID FAST BACILLI SEEN   Final   Culture     Final   Value: CULTURE WILL BE EXAMINED FOR 6 WEEKS BEFORE ISSUING A FINAL REPORT   Report Status PENDING   Incomplete  BODY FLUID CULTURE     Status: None   Collection Time    11/02/12 12:02 PM      Result  Value Range Status   Specimen Description PLEURAL   Final   Special Requests NONE   Final   Gram Stain     Final   Value: RARE WBC PRESENT, PREDOMINANTLY PMN     NO ORGANISMS SEEN   Culture NO GROWTH 3 DAYS   Final   Report Status 11/06/2012 FINAL   Final  FUNGUS CULTURE W SMEAR     Status: None   Collection Time    11/02/12 12:02 PM      Result Value Range Status   Specimen Description PLEURAL   Final   Special Requests Normal   Final   Fungal Smear NO YEAST OR FUNGAL ELEMENTS SEEN   Final   Culture CULTURE IN PROGRESS FOR FOUR WEEKS   Final   Report Status PENDING   Incomplete     Labs: Basic Metabolic Panel:  Recent Labs Lab 11/03/12 0340 11/04/12 0328 11/05/12 0340 11/06/12 0415 11/07/12 0448  NA 140 139  135 137 136  K 3.9 3.7 4.3 4.4 3.8  CL 103 100 96 93* 95*  CO2 30 29 29  33* 33*  GLUCOSE 163* 166* 165* 164* 133*  BUN 11 17 25* 30* 32*  CREATININE 1.02 0.94 0.98 1.26 1.00  CALCIUM 8.8 8.9 9.2 9.5 9.5  MG 2.1  --  2.2 2.4  --   PHOS  --   --  4.1 4.7*  --    Liver Function Tests:  Recent Labs Lab 11/02/12 1255  PROT 6.3   No results found for this basename: LIPASE, AMYLASE,  in the last 168 hours No results found for this basename: AMMONIA,  in the last 168 hours CBC:  Recent Labs Lab 11/03/12 0340 11/04/12 0328 11/05/12 0340 11/06/12 0415 11/07/12 0448  WBC 9.6 8.5 9.1 9.0 8.6  HGB 10.1* 10.6* 11.1* 11.7* 11.9*  HCT 29.4* 31.1* 33.1* 34.6* 34.0*  MCV 86.2 86.1 86.2 86.3 85.2  PLT 205 217 204 235 265   Cardiac Enzymes: No results found for this basename: CKTOTAL, CKMB, CKMBINDEX, TROPONINI,  in the last 168 hours BNP: BNP (last 3 results) No results found for this basename: PROBNP,  in the last 8760 hours CBG: No results found for this basename: GLUCAP,  in the last 168 hours     Signed:  Lelon Frohlich  [facility] Pager: [phone] 11/08/2012, 2:06 PM

## 2012-11-08 NOTE — Care Management Note (Signed)
Cm spoke with patient at bedside concerning discharge planning. Pt ambulated in hallway with RN. Pt ineligible for home oxygen therapy. Pt eval recommendation for HHPT. Per [facility] pt's admitting diagnosis ineligible for HHPT under Medicare/Medicaid guidelines but can offer [facility] for home safety eval. Pt declines HHRN.Cm spoke with patient concerning PCP follow up. Pt provided with information concerning [place]. Pt states able to afford cost of RX at [employer]. Pt provided with [facility] generic drug list as another resource. Pt states roommate has given him a 30 day notice. CSW to follow up.    Venita Lick Balbina Depace,RN,BSN [phone]

## 2012-11-08 NOTE — Progress Notes (Signed)
Clinical Social Work Department BRIEF PSYCHOSOCIAL ASSESSMENT 11/08/2012  Patient:  [name], [name]     Account Number:  [id number]     [place] date:  10/31/2012  Clinical Social Worker:  Renold Genta  Date/Time:  11/08/2012 12:05 PM  Referred by:  Physician  Date Referred:  11/08/2012 Referred for  Homelessness   Other Referral:   Interview type:  Patient Other interview type:    PSYCHOSOCIAL DATA Living Status:  FRIEND(S) Admitted from facility:   Level of care:   Primary support name:  [name] (sister) ph#: [phone] Primary support relationship to patient:  S[place] Degree of support available:   good    CURRENT CONCERNS Current Concerns  Other - See comment   Other Concerns:   homelessness    SOCIAL WORK ASSESSMENT / PLAN CSW received consult from Dr. Jerilee Hoh that patient reported to her that he has no place to live once being discharged from the hospital.   Assessment/plan status:  Information/Referral to [employer] Other assessment/ plan:   Information/referral to community resources:   CSW provided patient with list of homeless shelters and information on the [employer].    PATIENT'S/FAMILY'S RESPONSE TO PLAN OF CARE: Patient informed CSW that he is living at [facility] apartments [address]) with a roommate, but his roommate is giving him 30 days to move out. Patient was very appreciative of resources for homeless shelters & [facility]. Patient's sister will pickup patient at 1:00.       [name], [facility] Clinical Social Worker cell #: [phone]

## 2012-11-10 ENCOUNTER — Ambulatory Visit: Payer: Self-pay | Admitting: Internal Medicine

## 2012-11-12 ENCOUNTER — Encounter: Payer: Self-pay | Admitting: Family Medicine

## 2012-11-12 ENCOUNTER — Ambulatory Visit: Payer: Self-pay | Attending: Family Medicine | Admitting: Family Medicine

## 2012-11-12 VITALS — BP 123/79 | HR 84 | Temp 97.9°F | Resp 20 | Ht 77.0 in | Wt 206.0 lb

## 2012-11-12 DIAGNOSIS — F329 Major depressive disorder, single episode, unspecified: Secondary | ICD-10-CM

## 2012-11-12 DIAGNOSIS — J189 Pneumonia, unspecified organism: Secondary | ICD-10-CM

## 2012-11-12 DIAGNOSIS — F319 Bipolar disorder, unspecified: Secondary | ICD-10-CM

## 2012-11-12 DIAGNOSIS — I10 Essential (primary) hypertension: Secondary | ICD-10-CM

## 2012-11-12 DIAGNOSIS — F3289 Other specified depressive episodes: Secondary | ICD-10-CM

## 2012-11-12 NOTE — Progress Notes (Signed)
Patient ID: [name], male   DOB: 10/03/1965, 47 y.o.   MRN: [id number]  CC: hospital follow up   HPI: Pt reports that he is feeling much better.  He reports that he stopped smoking.  He was admitted with pneumonia and pleural effusion and was in the ICU for a while.  He reports that he completed the course of levofloxacin.  He is recommended to have a repeat CXR in 4-6 weeks.  Pt is bipolar and is in mental health.  He has some stressors at home.    No Known Allergies Past Medical History  Diagnosis Date  . Anxiety   . Hypertension   . Depression     bipolar [facility]  . Hypogonadism male   . ADD (attention deficit disorder)   . Microscopic hematuria     hereditary s/p Urology eval  . Bipolar 1 disorder   . Arthritis     right hip   Current Outpatient Prescriptions on File Prior to Visit  Medication Sig Dispense Refill  . buPROPion (WELLBUTRIN SR) 150 MG 12 hr tablet Take 150 mg by mouth 3 (three) times daily.       . cholecalciferol (VITAMIN D) 1000 UNITS tablet Take 2,000 Units by mouth every morning.       . clonazePAM (KLONOPIN) 1 MG tablet Take 1 tablet (1 mg total) by mouth 3 (three) times daily.  90 tablet  0  . cyanocobalamin 500 MCG tablet Take 500 mcg by mouth every morning.       . divalproex (DEPAKOTE ER) 250 MG 24 hr tablet Take 500 mg by mouth at bedtime.       . ferrous sulfate 325 (65 FE) MG tablet Take 1 tablet (325 mg total) by mouth 2 (two) times daily with a meal.  60 tablet  3  . Glucos-MSM-C-Mn-Ginger-Willow (GLUCOSAMINE MSM COMPLEX PO) Take 2 capsules by mouth 2 (two) times daily.       Marland Kitchen HYDROcodone-acetaminophen (NORCO) 10-325 MG per tablet Take 0.5-1 tablets by mouth every 8 (eight) hours as needed for pain.  15 tablet  0  . levofloxacin (LEVAQUIN) 750 MG tablet Take 1 tablet (750 mg total) by mouth daily. For 5 days  5 tablet  0  . lisdexamfetamine (VYVANSE) 20 MG capsule Take 20 mg by mouth every morning.      . Multiple Vitamin (MULTIVITAMIN  WITH MINERALS) TABS Take 1 tablet by mouth every morning.      Marland Kitchen QUEtiapine (SEROQUEL XR) 200 MG 24 hr tablet Take 400 mg by mouth at bedtime.       . tadalafil (CIALIS) 5 MG tablet Take 1 tablet (5 mg total) by mouth daily as needed.  30 tablet  3  . Testosterone (ANDROGEL) 20.25 MG/1.25GM (1.62%) GEL Place 3 application onto the skin daily.  215 g  3   No current facility-administered medications on file prior to visit.   Family History  Problem Relation Age of Onset  . Diabetes Father   . Cancer Mother     died of melanoma with mets  . Cervical cancer Sister   . Diabetes Sister    History   Social History  . Marital Status: Single    Spouse Name: N/A    Number of Children: N/A  . Years of Education: N/A   Occupational History  . [employer] manager    Social History Main Topics  . Smoking status: Current Every Day Smoker -- 0.50 packs/day for 25 years  Types: Cigarettes  . Smokeless tobacco: Never Used  . Alcohol Use: No  . Drug Use: No  . Sexually Active: Not Currently   Other Topics Concern  . Not on file   Social History Narrative   regualar exercise-no    Review of Systems  Constitutional: Negative for fever, chills, diaphoresis, activity change, appetite change and fatigue.  HENT: Negative for ear pain, nosebleeds, congestion, facial swelling, rhinorrhea, neck pain, neck stiffness and ear discharge.   Eyes: Negative for pain, discharge, redness, itching and visual disturbance.  Respiratory: Negative for cough, choking, chest tightness, shortness of breath, wheezing and stridor.   Cardiovascular: Negative for chest pain, palpitations and leg swelling.  Gastrointestinal: Negative for abdominal distention.  Genitourinary: Negative for dysuria, urgency, frequency, hematuria, flank pain, decreased urine volume, difficulty urinating and dyspareunia.  Musculoskeletal: Negative for back pain, joint swelling, arthralgias and gait problem.  Neurological: Negative for  dizziness, tremors, seizures, syncope, facial asymmetry, speech difficulty, weakness, light-headedness, numbness and headaches.  Hematological: Negative for adenopathy. Does not bruise/bleed easily.  Psychiatric/Behavioral: Negative for hallucinations, behavioral problems, confusion, dysphoric mood, decreased concentration and agitation.    Objective:   Filed Vitals:   11/12/12 0940  BP: 123/79  Pulse: 84  Temp: 97.9 F (36.6 C)  Resp: 20    Physical Exam  Constitutional: Appears well-developed and well-nourished. No distress.  HENT: Normocephalic. External right and left ear normal. Oropharynx is clear and moist.  Eyes: Conjunctivae and EOM are normal. PERRLA, no scleral icterus.  Neck: Normal ROM. Neck supple. No JVD. No tracheal deviation. No thyromegaly.  CVS: RRR, S1/S2 +, no murmurs, no gallops, no carotid bruit.  Pulmonary: Effort and breath sounds normal, no stridor, rhonchi, wheezes, rales. Rare basilar crackles heard.  Abdominal: Soft. BS +,  no distension, tenderness, rebound or guarding.  Musculoskeletal: Normal range of motion. No edema and no tenderness.  Lymphadenopathy: No lymphadenopathy noted, cervical, inguinal. Neuro: Alert. Normal reflexes, muscle tone coordination. No cranial nerve deficit. Skin: Skin is warm and dry. No rash noted. Not diaphoretic. No erythema. No pallor.  Psychiatric: Normal mood and affect. Behavior, judgment, thought content normal.   Lab Results  Component Value Date   WBC 8.6 11/07/2012   HGB 11.9* 11/07/2012   HCT 34.0* 11/07/2012   MCV 85.2 11/07/2012   PLT 265 11/07/2012   Lab Results  Component Value Date   CREATININE 1.00 11/07/2012   BUN 32* 11/07/2012   NA 136 11/07/2012   K 3.8 11/07/2012   CL 95* 11/07/2012   CO2 33* 11/07/2012    Lab Results  Component Value Date   HGBA1C 6.0* 11/01/2012   Lipid Panel     Component Value Date/Time   CHOL 104 11/02/2012 1255   TRIG 258.0* 01/18/2010 1527   HDL 31.00* 01/18/2010 1527    CHOLHDL 6 01/18/2010 1527   VLDL 51.6* 01/18/2010 1527   LDLCALC 84 12/11/2006 2032       Assessment and plan:   Patient Active Problem List   Diagnosis Date Noted  . Acute respiratory failure 11/02/2012  . Pneumonia, organism unspecified 11/02/2012  . Pleural effusion 11/02/2012  . Hypoxemia 11/02/2012  . HCAP (healthcare-associated pneumonia) 10/31/2012  . Anemia 10/31/2012  . Osteoarthritis of right hip 11/28/2011  . Right hip pain 07/22/2011  . Bilateral hip pain 07/22/2011  . LBP (low back pain) 05/20/2011  . Testicular pain, right 05/20/2011  . Erectile dysfunction 02/17/2011  . Constipation - functional 02/17/2011  . Diabetes type 2, controlled 11/06/2010  .  HYPOGONADISM 05/20/2010  . TOBACCO USER 05/20/2010  . Demoralization and apathy 05/20/2010  . BIPOLAR DISORDER UNSPECIFIED 01/18/2010  . DEPRESSION 01/18/2010  . MICROSCOPIC HEMATURIA 01/18/2010  . SHOULDER PAIN 01/18/2010  . ANXIETY 12/05/2006  . HYPERTENSION 12/05/2006   Pt is doing much better.  Completed his course of antibiotics.    Pt will be asked to return in 6 weeks to have a repeat CXR done to ensure resolution of pneumonia.  Pt is being referred to see the social worker to discuss his home stressors.   RTC in 6 weeks  The patient was given clear instructions to go to ER or return to medical center if symptoms don't improve, worsen or new problems develop.  The patient verbalized understanding.  The patient was told to call to get any lab results if not heard anything in the next week.    Gerlene Fee, MD, CDE, [place], [place]

## 2012-11-12 NOTE — Progress Notes (Signed)
PT HERE TO ESTABLISH CARE S/P L LUNG PNA.HOPSITALIZED X 1 WEEK @ [facility]. C/O INTERMIT PLEURITIC L CP.STATES HE USES INCENT SPIROMETER. SATS 99% R/A.

## 2012-11-12 NOTE — Patient Instructions (Signed)
Please return in 6 weeks to have a repeat chest xray done to ensure resolution of pneumonia  Pneumonia, Adult Pneumonia is an infection of the lungs.  CAUSES Pneumonia may be caused by bacteria or a virus. Usually, these infections are caused by breathing infectious particles into the lungs (respiratory tract). SYMPTOMS   Cough.  Fever.  Chest pain.  Increased rate of breathing.  Wheezing.  Mucus production. DIAGNOSIS  If you have the common symptoms of pneumonia, your caregiver will typically confirm the diagnosis with a chest X-ray. The X-ray will show an abnormality in the lung (pulmonary infiltrate) if you have pneumonia. Other tests of your blood, urine, or sputum may be done to find the specific cause of your pneumonia. Your caregiver may also do tests (blood gases or pulse oximetry) to see how well your lungs are working. TREATMENT  Some forms of pneumonia may be spread to other people when you cough or sneeze. You may be asked to wear a mask before and during your exam. Pneumonia that is caused by bacteria is treated with antibiotic medicine. Pneumonia that is caused by the influenza virus may be treated with an antiviral medicine. Most other viral infections must run their course. These infections will not respond to antibiotics.  PREVENTION A pneumococcal shot (vaccine) is available to prevent a common bacterial cause of pneumonia. This is usually suggested for:  People over 59 years old.  Patients on chemotherapy.  People with chronic lung problems, such as bronchitis or emphysema.  People with immune system problems. If you are over 65 or have a high risk condition, you may receive the pneumococcal vaccine if you have not received it before. In some countries, a routine influenza vaccine is also recommended. This vaccine can help prevent some cases of pneumonia.You may be offered the influenza vaccine as part of your care. If you smoke, it is time to quit. You may  receive instructions on how to stop smoking. Your caregiver can provide medicines and counseling to help you quit. HOME CARE INSTRUCTIONS   Cough suppressants may be used if you are losing too much rest. However, coughing protects you by clearing your lungs. You should avoid using cough suppressants if you can.  Your caregiver may have prescribed medicine if he or she thinks your pneumonia is caused by a bacteria or influenza. Finish your medicine even if you start to feel better.  Your caregiver may also prescribe an expectorant. This loosens the mucus to be coughed up.  Only take over-the-counter or prescription medicines for pain, discomfort, or fever as directed by your caregiver.  Do not smoke. Smoking is a common cause of bronchitis and can contribute to pneumonia. If you are a smoker and continue to smoke, your cough may last several weeks after your pneumonia has cleared.  A cold steam vaporizer or humidifier in your room or home may help loosen mucus.  Coughing is often worse at night. Sleeping in a semi-upright position in a recliner or using a couple pillows under your head will help with this.  Get rest as you feel it is needed. Your body will usually let you know when you need to rest. SEEK IMMEDIATE MEDICAL CARE IF:   Your illness becomes worse. This is especially true if you are elderly or weakened from any other disease.  You cannot control your cough with suppressants and are losing sleep.  You begin coughing up blood.  You develop pain which is getting worse or is uncontrolled with  medicines.  You have a fever.  Any of the symptoms which initially brought you in for treatment are getting worse rather than better.  You develop shortness of breath or chest pain. MAKE SURE YOU:   Understand these instructions.  Will watch your condition.  Will get help right away if you are not doing well or get worse. Document Released: 04/07/2005 Document Revised: 06/30/2011  Document Reviewed: 06/27/2010 [facility] Patient Information 2014 [place], [place].

## 2012-11-19 ENCOUNTER — Ambulatory Visit: Payer: Self-pay

## 2012-11-19 NOTE — Progress Notes (Unsigned)
CSW met with patient due to challenges with housing stability. Patient identified that his current living situation was difficult because the person that he is living with has repeatedly stated that he wants him to leave.  Patient is not working but desires to work.  CSW assessed current housing options as well as offered plans to support the reduction of anxiety as well as resources in the event that he no longer has a place to live.  Patient stated that he would follow up with this CSW in 1 week in order to identify if further support is needed.   Christene Lye MSW, [place]

## 2012-11-30 LAB — FUNGUS CULTURE W SMEAR: Fungal Smear: NONE SEEN

## 2012-12-02 ENCOUNTER — Encounter: Payer: Self-pay | Admitting: Internal Medicine

## 2012-12-03 ENCOUNTER — Telehealth: Payer: Self-pay | Admitting: *Deleted

## 2012-12-03 NOTE — Telephone Encounter (Signed)
OK to fill this prescription with additional refills x2 Thank you!  

## 2012-12-03 NOTE — Telephone Encounter (Signed)
Rf req for Clonazepam 2 mg 1 po qid # 120. Last filled 11/08/12. Ok to Rf?

## 2012-12-06 MED ORDER — CLONAZEPAM 1 MG PO TABS: 1.0000 mg | ORAL_TABLET | Freq: Three times a day (TID) | ORAL | Status: DC

## 2012-12-06 NOTE — Telephone Encounter (Signed)
Done

## 2012-12-15 LAB — AFB CULTURE WITH SMEAR (NOT AT ARMC)

## 2012-12-24 ENCOUNTER — Encounter: Payer: Self-pay | Admitting: Internal Medicine

## 2012-12-24 ENCOUNTER — Telehealth: Payer: Self-pay | Admitting: *Deleted

## 2012-12-24 NOTE — Telephone Encounter (Signed)
Refill request for Hydrocodone 10-325mg  Last OV 5.23.14 Last filled 7.10.14

## 2012-12-26 NOTE — Telephone Encounter (Signed)
OK to fill this prescription (1 po tid prn #90) with additional refills x0. Sch OV Thank you!

## 2012-12-27 MED ORDER — HYDROCODONE-ACETAMINOPHEN 10-325 MG PO TABS: 1.0000 | ORAL_TABLET | Freq: Three times a day (TID) | ORAL | Status: DC | PRN

## 2012-12-27 NOTE — Telephone Encounter (Signed)
Refill done.  

## 2012-12-28 ENCOUNTER — Ambulatory Visit: Payer: Self-pay

## 2013-01-05 ENCOUNTER — Other Ambulatory Visit: Payer: Self-pay | Admitting: Internal Medicine

## 2013-01-05 ENCOUNTER — Encounter: Payer: Self-pay | Admitting: Internal Medicine

## 2013-01-05 MED ORDER — HYDROCODONE-ACETAMINOPHEN 10-325 MG PO TABS: 1.0000 | ORAL_TABLET | Freq: Three times a day (TID) | ORAL | Status: DC | PRN

## 2013-01-07 ENCOUNTER — Other Ambulatory Visit: Payer: Self-pay | Admitting: *Deleted

## 2013-01-07 ENCOUNTER — Telehealth: Payer: Self-pay | Admitting: *Deleted

## 2013-01-07 MED ORDER — TESTOSTERONE 20.25 MG/1.25GM (1.62%) TD GEL: 3.0000 "application " | Freq: Every day | TRANSDERMAL | Status: DC

## 2013-01-07 MED ORDER — TADALAFIL 5 MG PO TABS: 5.0000 mg | ORAL_TABLET | Freq: Every day | ORAL | Status: DC | PRN

## 2013-01-07 NOTE — Telephone Encounter (Signed)
I faxed Cialis and Androgel Rxs to the appropriate patient assistance fax numbers per pt request. Pt informed.

## 2013-01-20 ENCOUNTER — Encounter: Payer: Self-pay | Admitting: Internal Medicine

## 2013-01-21 ENCOUNTER — Telehealth: Payer: Self-pay

## 2013-01-21 NOTE — Telephone Encounter (Addendum)
I called patient to let him know his Cialis samples have come in and he can pick them up at our front desk. He stated his Androgel won't be shipped until our office calls a representative at [phone]. This has already been done by [employer], CMA.

## 2013-01-25 ENCOUNTER — Telehealth: Payer: Self-pay

## 2013-01-25 NOTE — Telephone Encounter (Signed)
Ok Thx 

## 2013-01-25 NOTE — Telephone Encounter (Signed)
Received refill request from [facility] request refills for Celebrex 200mg  . Rx last written 11/28/2011 and pt last seen 09/10/2012 Please advise Thanks

## 2013-01-26 MED ORDER — CELECOXIB 200 MG PO CAPS: 200.0000 mg | ORAL_CAPSULE | Freq: Two times a day (BID) | ORAL | Status: AC

## 2013-02-01 ENCOUNTER — Other Ambulatory Visit: Payer: Self-pay | Admitting: Internal Medicine

## 2013-02-03 ENCOUNTER — Other Ambulatory Visit: Payer: Self-pay | Admitting: *Deleted

## 2013-02-03 MED ORDER — HYDROCODONE-ACETAMINOPHEN 10-325 MG PO TABS: 1.0000 | ORAL_TABLET | Freq: Three times a day (TID) | ORAL | Status: DC | PRN

## 2013-02-11 ENCOUNTER — Encounter: Payer: Self-pay | Admitting: Internal Medicine

## 2013-02-24 ENCOUNTER — Other Ambulatory Visit: Payer: Self-pay

## 2013-03-07 ENCOUNTER — Encounter: Payer: Self-pay | Admitting: Internal Medicine

## 2013-03-07 ENCOUNTER — Telehealth: Payer: Self-pay | Admitting: Internal Medicine

## 2013-03-08 ENCOUNTER — Other Ambulatory Visit: Payer: Self-pay | Admitting: Internal Medicine

## 2013-03-08 NOTE — Telephone Encounter (Signed)
[name], please renew his meds Thx   Dear [name], We will refill your meds. Thank you! AP ===View-only below this line===   ----- Message -----    From: [name]    Sent: 03/07/2013  9:02 AM EST      To: [name], MD Subject: Non-Urgent Medical Question  Dr. [name],    I need a prescription for my pain medication. Marzetta Board suggested that I call her when I needed it, however they would not give me her voicemail . I'm also out of klonopin, I have requested that [facility] contact you for a refill. On my record the information was changed from 2mg  4 times a day to 1 mg 4 times a day. If you could change that for me I would greatly appreciate it so that I may request a refill from this app instead of contacting the pharmacy.    I should be getting full benefits in the next 60 days and will be making an appointment to see you.     If you can let me know when the perception is ready I would greatly appreciate it.    Thank you for your time.

## 2013-03-09 ENCOUNTER — Other Ambulatory Visit: Payer: Self-pay | Admitting: *Deleted

## 2013-03-09 MED ORDER — HYDROCODONE-ACETAMINOPHEN 10-325 MG PO TABS: 1.0000 | ORAL_TABLET | Freq: Three times a day (TID) | ORAL | Status: DC | PRN

## 2013-03-09 MED ORDER — CLONAZEPAM 1 MG PO TABS: 1.0000 mg | ORAL_TABLET | Freq: Three times a day (TID) | ORAL | Status: DC

## 2013-03-16 ENCOUNTER — Other Ambulatory Visit: Payer: Self-pay | Admitting: *Deleted

## 2013-03-16 ENCOUNTER — Telehealth: Payer: Self-pay | Admitting: *Deleted

## 2013-03-16 NOTE — Telephone Encounter (Signed)
Left detailed mess informing pt his Clonazepam and Hydroco/APAP Rxs are here and ready for p/u.

## 2013-03-21 ENCOUNTER — Telehealth: Payer: Self-pay | Admitting: Internal Medicine

## 2013-03-21 NOTE — Telephone Encounter (Signed)
Returned pt call regarding scheduling appt.

## 2013-03-27 ENCOUNTER — Encounter: Payer: Self-pay | Admitting: Internal Medicine

## 2013-03-31 ENCOUNTER — Other Ambulatory Visit: Payer: Self-pay | Admitting: Internal Medicine

## 2013-03-31 MED ORDER — CLONAZEPAM 2 MG PO TABS: 1.0000 mg | ORAL_TABLET | Freq: Three times a day (TID) | ORAL | Status: DC | PRN

## 2013-04-12 ENCOUNTER — Telehealth: Payer: Self-pay | Admitting: Internal Medicine

## 2013-04-18 ENCOUNTER — Encounter: Payer: Self-pay | Admitting: Internal Medicine

## 2013-04-22 MED ORDER — HYDROCODONE-ACETAMINOPHEN 10-325 MG PO TABS: 1.0000 | ORAL_TABLET | Freq: Three times a day (TID) | ORAL | Status: DC | PRN

## 2013-04-22 NOTE — Telephone Encounter (Signed)
Patient states he has been trying for 2 weeks to get med refill.  He states he is completely out.  He is requesting to see if another physician will refill this until Dr. [name] returns.

## 2013-04-22 NOTE — Telephone Encounter (Signed)
Ok to fill in PCP absence per Dr. Linda Hedges. Rx printed/ready for p/u. Left detailed mess informing pt.

## 2013-04-22 NOTE — Telephone Encounter (Signed)
Ok to fill per Dr. Linda Hedges. Left detailed message and MyChart message informing pt rx is ready for p/u.

## 2013-05-17 ENCOUNTER — Other Ambulatory Visit: Payer: Self-pay | Admitting: Internal Medicine

## 2013-05-17 ENCOUNTER — Encounter: Payer: Self-pay | Admitting: Internal Medicine

## 2013-05-19 ENCOUNTER — Other Ambulatory Visit: Payer: Self-pay | Admitting: Internal Medicine

## 2013-05-19 MED ORDER — HYDROCODONE-ACETAMINOPHEN 10-325 MG PO TABS: 1.0000 | ORAL_TABLET | Freq: Three times a day (TID) | ORAL | Status: DC | PRN

## 2013-06-08 ENCOUNTER — Emergency Department (HOSPITAL_COMMUNITY): Payer: Worker's Compensation

## 2013-06-08 ENCOUNTER — Encounter (HOSPITAL_COMMUNITY): Payer: Self-pay | Admitting: Emergency Medicine

## 2013-06-08 ENCOUNTER — Emergency Department (HOSPITAL_COMMUNITY)
Admission: EM | Admit: 2013-06-08 | Discharge: 2013-06-08 | Disposition: A | Discharge status: Home / Self Care | Payer: Worker's Compensation | Attending: Emergency Medicine | Admitting: Emergency Medicine

## 2013-06-08 DIAGNOSIS — W009XXA Unspecified fall due to ice and snow, initial encounter: Secondary | ICD-10-CM

## 2013-06-08 DIAGNOSIS — S8990XA Unspecified injury of unspecified lower leg, initial encounter: Secondary | ICD-10-CM | POA: Insufficient documentation

## 2013-06-08 DIAGNOSIS — F411 Generalized anxiety disorder: Secondary | ICD-10-CM | POA: Insufficient documentation

## 2013-06-08 DIAGNOSIS — S99919A Unspecified injury of unspecified ankle, initial encounter: Secondary | ICD-10-CM

## 2013-06-08 DIAGNOSIS — G8929 Other chronic pain: Secondary | ICD-10-CM | POA: Insufficient documentation

## 2013-06-08 DIAGNOSIS — F988 Other specified behavioral and emotional disorders with onset usually occurring in childhood and adolescence: Secondary | ICD-10-CM | POA: Insufficient documentation

## 2013-06-08 DIAGNOSIS — F17200 Nicotine dependence, unspecified, uncomplicated: Secondary | ICD-10-CM | POA: Insufficient documentation

## 2013-06-08 DIAGNOSIS — Z862 Personal history of diseases of the blood and blood-forming organs and certain disorders involving the immune mechanism: Secondary | ICD-10-CM | POA: Insufficient documentation

## 2013-06-08 DIAGNOSIS — Y929 Unspecified place or not applicable: Secondary | ICD-10-CM | POA: Insufficient documentation

## 2013-06-08 DIAGNOSIS — M1610 Unilateral primary osteoarthritis, unspecified hip: Secondary | ICD-10-CM | POA: Insufficient documentation

## 2013-06-08 DIAGNOSIS — Z8639 Personal history of other endocrine, nutritional and metabolic disease: Secondary | ICD-10-CM | POA: Insufficient documentation

## 2013-06-08 DIAGNOSIS — IMO0002 Reserved for concepts with insufficient information to code with codable children: Secondary | ICD-10-CM | POA: Insufficient documentation

## 2013-06-08 DIAGNOSIS — I10 Essential (primary) hypertension: Secondary | ICD-10-CM | POA: Insufficient documentation

## 2013-06-08 DIAGNOSIS — S79919A Unspecified injury of unspecified hip, initial encounter: Secondary | ICD-10-CM | POA: Insufficient documentation

## 2013-06-08 DIAGNOSIS — S3981XA Other specified injuries of abdomen, initial encounter: Secondary | ICD-10-CM | POA: Insufficient documentation

## 2013-06-08 DIAGNOSIS — Z79899 Other long term (current) drug therapy: Secondary | ICD-10-CM | POA: Insufficient documentation

## 2013-06-08 DIAGNOSIS — S79929A Unspecified injury of unspecified thigh, initial encounter: Principal | ICD-10-CM

## 2013-06-08 DIAGNOSIS — M79606 Pain in leg, unspecified: Secondary | ICD-10-CM

## 2013-06-08 DIAGNOSIS — S99929A Unspecified injury of unspecified foot, initial encounter: Secondary | ICD-10-CM

## 2013-06-08 DIAGNOSIS — W010XXA Fall on same level from slipping, tripping and stumbling without subsequent striking against object, initial encounter: Secondary | ICD-10-CM | POA: Insufficient documentation

## 2013-06-08 DIAGNOSIS — M25551 Pain in right hip: Secondary | ICD-10-CM

## 2013-06-08 DIAGNOSIS — F319 Bipolar disorder, unspecified: Secondary | ICD-10-CM | POA: Insufficient documentation

## 2013-06-08 DIAGNOSIS — Y939 Activity, unspecified: Secondary | ICD-10-CM | POA: Insufficient documentation

## 2013-06-08 MED ORDER — KETOROLAC TROMETHAMINE 60 MG/2ML IM SOLN
60.0000 mg | Freq: Once | INTRAMUSCULAR | Status: AC
  Administered 2013-06-08: 60 mg via INTRAMUSCULAR
  Filled 2013-06-08: qty 2

## 2013-06-08 NOTE — Discharge Instructions (Signed)
You were evaluated today for exacerbation of right leg pain and right hip pain after fall onto ice.  X-rays show no new injuries to right hip. Records indicate you have pain medication at home. Please take as prescribed by your primary care provider. You may alternate ice and heat as needed for comfort. Follow up with primary care for continued right hip pain.

## 2013-06-08 NOTE — ED Provider Notes (Signed)
Medical screening examination/treatment/procedure(s) were performed by non-physician practitioner and as supervising physician I was immediately available for consultation/collaboration.  EKG Interpretation   None        Jasper Riling. Alvino Chapel, MD 06/08/13 2220

## 2013-06-08 NOTE — ED Notes (Signed)
Pt states he fell today, c/o back and left hip pain.

## 2013-06-08 NOTE — ED Notes (Signed)
Pt ambulatory to exam room with steady gait.  

## 2013-06-08 NOTE — ED Provider Notes (Signed)
CSN: [id number]     Arrival date & time 06/08/13  1543 History  This chart was scribed for non-physician practitioner Noland Fordyce, PA-C working with Jasper Riling. Alvino Chapel, MD by Anastasia Pall, ED scribe. This patient was seen in room WTR5/WTR5 and the patient's care was started at 5:29 PM.   Chief Complaint  Patient presents with  . Fall  . Back Pain   (Consider location/radiation/quality/duration/timing/severity/associated sxs/prior Treatment) The history is provided by the patient. No language interpreter was used.   HPI Comments: [name] is a 48 y.o. male who presents to the Emergency Department complaining of constant, right hip, right groin, right knee, and right, lower back pain, with a pain severity of 8/10, onset 3:00 PM today after slipping on ice and falling on his right side. He states his right leg went sideways and his left leg remained straight when he landed. He denies having any pain medication today, but reports taking Seroquel and states he works 15 hours shifts so he is tired. He denies h/o hip surgery, but reports h/o osteoarthritis. He reports having L-4, L-5 fusion, by Dr. Vertell Limber. He has intermittent numbness in both his LE from h/o back pain. Reports hx of limp. He reports last following up with Dr. Vertell Limber 2005-06. He reports h/o taking Vicodin, Celebrex, and Glucosamine which is prescribed by his PCP. He denies LOC, head trauma, wounds, and any other associated symptoms.   PCP - [name], MD  Past Medical History  Diagnosis Date  . Anxiety   . Hypertension   . Depression     bipolar [facility]  . Hypogonadism male   . ADD (attention deficit disorder)   . Microscopic hematuria     hereditary s/p Urology eval  . Bipolar 1 disorder   . Arthritis     right hip   Past Surgical History  Procedure Laterality Date  . Lumbar disc surgery    . Back surgery     Family History  Problem Relation Age of Onset  . Diabetes Father   . Cancer Mother     died  of melanoma with mets  . Cervical cancer Sister   . Diabetes Sister    History  Substance Use Topics  . Smoking status: Current Every Day Smoker -- 0.50 packs/day for 25 years    Types: Cigarettes  . Smokeless tobacco: Never Used  . Alcohol Use: No    Review of Systems  Musculoskeletal: Positive for arthralgias (right knee) and back pain.  Skin: Negative for wound.  Neurological: Positive for numbness. Negative for syncope, weakness and headaches.  All other systems reviewed and are negative.   Allergies  Review of patient's allergies indicates no known allergies.  Home Medications   Current Outpatient Rx  Name  Route  Sig  Dispense  Refill  . buPROPion (WELLBUTRIN SR) 150 MG 12 hr tablet   Oral   Take 150 mg by mouth 3 (three) times daily.          . cholecalciferol (VITAMIN D) 1000 UNITS tablet   Oral   Take 2,000 Units by mouth every morning.          . clonazePAM (KLONOPIN) 2 MG tablet   Oral   Take 0.5-1 tablets (1-2 mg total) by mouth 3 (three) times daily as needed for anxiety.   90 tablet   2   . cyanocobalamin 500 MCG tablet   Oral   Take 500 mcg by mouth every morning.          Marland Kitchen  divalproex (DEPAKOTE ER) 500 MG 24 hr tablet   Oral   Take 1,000 mg by mouth at bedtime.         Donnie Aho (GLUCOSAMINE MSM COMPLEX PO)   Oral   Take 2 capsules by mouth 2 (two) times daily.          Marland Kitchen HYDROcodone-acetaminophen (NORCO) 10-325 MG per tablet   Oral   Take 1 tablet by mouth 3 (three) times daily as needed.   90 tablet   0   . lisdexamfetamine (VYVANSE) 20 MG capsule   Oral   Take 20 mg by mouth every morning.         . Multiple Vitamin (MULTIVITAMIN WITH MINERALS) TABS   Oral   Take 1 tablet by mouth every morning.         Marland Kitchen QUEtiapine (SEROQUEL XR) 200 MG 24 hr tablet   Oral   Take 600 mg by mouth at bedtime.         . tadalafil (CIALIS) 5 MG tablet   Oral   Take 5 mg by mouth daily.         .  Testosterone (ANDROGEL) 20.25 MG/1.25GM (1.62%) GEL   Transdermal   Place 3 application onto the skin daily.   215 g   1     patient number [id number]    BP 143/76  Pulse 92  Temp(Src) 97.5 F (36.4 C) (Oral)  Resp 16  SpO2 95%  Physical Exam  Nursing note and vitals reviewed. Constitutional: He is oriented to person, place, and time. He appears well-developed and well-nourished.  Pt appears drowsy.  HENT:  Head: Normocephalic and atraumatic.  Eyes: EOM are normal.  Neck: Normal range of motion.  Cardiovascular: Normal rate.   Pulmonary/Chest: Effort normal.  Musculoskeletal: Normal range of motion.  Tenderness over right upper buttock. Antalgic gait. (Chronic limp per pt). No midline spinal tenderness, step offs, crepitus. No erythema or ecchymosis. Skin intact.   Neurological: He is alert and oriented to person, place, and time. He displays a negative Romberg sign. Coordination and gait normal.  Skin: Skin is warm and dry.  Psychiatric: He has a normal mood and affect. His behavior is normal.    ED Course  Procedures (including critical care time)  DIAGNOSTIC STUDIES: Oxygen Saturation is 95% on room air, normal by my interpretation.    COORDINATION OF CARE: 5:33 PM-Discussed treatment plan which includes DG right hip and anti-inflammatory with pt at bedside and pt agreed to plan.   Labs Review Labs Reviewed - No data to display Imaging Review Dg Hip Complete Right  06/08/2013   CLINICAL DATA:  Status post fall.  Right hip pain.  EXAM: RIGHT HIP - COMPLETE 2+ VIEW  COMPARISON:  Plain films right hip 06/27/2011.  FINDINGS: No acute bony or joint abnormality is identified. The patient has advanced bilateral hip osteoarthritis which is worse on the right and has progressed since the prior study. Postoperative change of lower lumbar fusion is noted.  IMPRESSION: No acute finding.  Advanced bilateral hip osteoarthritis, worse on the right.   Electronically Signed   By: Inge Rise M.D.   On: 06/08/2013 18:07    EKG Interpretation   None      Medications  ketorolac (TORADOL) injection 60 mg (60 mg Intramuscular Given 06/08/13 1743)    MDM   Final diagnoses:  Fall from slipping on ice  Right hip pain  Chronic leg pain    Pt c/o exacerbation  of right hip and leg pain after slipping and falling on ice earlier today. No red flag symptoms.  Pt does appear drowsy, pt reports due to taking his seroquel earlier today. Denies taking pain medication today.  Pt is tender along right hip,  Antalgic gait, however pt reports chronic limp. Plain films: no acute findings.  Will tx symptomatically. Due to pt being drowsy in ED due to reported Seroquel, advised pt he may not have percocet at this time as this is another medication that can cause fatigue.   According to the [place] Controlled Substance Database, pt had a 30 day supply prescription filled for percocet 10-325 on 05/23/13.  Discussed with pt he may use flexeril and antiinflammatories as needed for pain. Advised to f/u with his PCP for ongoing right hip and leg pain.   I personally performed the services described in this documentation, which was scribed in my presence. The recorded information has been reviewed and is accurate.   Noland Fordyce, PA-C 06/08/13 1939

## 2013-06-10 ENCOUNTER — Encounter: Payer: Self-pay | Admitting: Internal Medicine

## 2013-06-13 ENCOUNTER — Ambulatory Visit (INDEPENDENT_AMBULATORY_CARE_PROVIDER_SITE_OTHER): Payer: BC Managed Care – PPO | Admitting: Internal Medicine

## 2013-06-13 ENCOUNTER — Encounter: Payer: Self-pay | Admitting: Internal Medicine

## 2013-06-13 VITALS — Resp 16

## 2013-06-13 DIAGNOSIS — E291 Testicular hypofunction: Secondary | ICD-10-CM

## 2013-06-13 DIAGNOSIS — M25551 Pain in right hip: Secondary | ICD-10-CM

## 2013-06-13 DIAGNOSIS — I10 Essential (primary) hypertension: Secondary | ICD-10-CM

## 2013-06-13 DIAGNOSIS — F3289 Other specified depressive episodes: Secondary | ICD-10-CM

## 2013-06-13 DIAGNOSIS — M545 Low back pain, unspecified: Secondary | ICD-10-CM

## 2013-06-13 DIAGNOSIS — F329 Major depressive disorder, single episode, unspecified: Secondary | ICD-10-CM

## 2013-06-13 DIAGNOSIS — F411 Generalized anxiety disorder: Secondary | ICD-10-CM

## 2013-06-13 DIAGNOSIS — Z23 Encounter for immunization: Secondary | ICD-10-CM

## 2013-06-13 DIAGNOSIS — M25559 Pain in unspecified hip: Secondary | ICD-10-CM

## 2013-06-13 DIAGNOSIS — E119 Type 2 diabetes mellitus without complications: Secondary | ICD-10-CM

## 2013-06-13 MED ORDER — CLONAZEPAM 2 MG PO TABS: 1.0000 mg | ORAL_TABLET | Freq: Three times a day (TID) | ORAL | Status: DC | PRN

## 2013-06-13 MED ORDER — HYDROMORPHONE HCL 4 MG PO TABS: 4.0000 mg | ORAL_TABLET | Freq: Three times a day (TID) | ORAL | Status: DC | PRN

## 2013-06-13 NOTE — Progress Notes (Signed)
Subjective:    HPI He fell at work on 06/08/13 - LBP - workman's comp  Hydrocodone is not helping  Hip Pain  The pain is present in the right thigh and right leg. The pain is at a severity of 9/10. The pain is severe. The pain has been constant since onset. The treatment provided moderate relief.  Back Pain  This is a chronic problem. The current episode started more than 1 year ago. The pain is present in the lumbar spine and gluteal. The quality of the pain is described as aching and burning. The pain is at a severity of 9/10. The pain is severe. The symptoms are aggravated by bending, position, standing and twisting. Stiffness is present all day. Pertinent negatives include no chest pain or weakness. He has tried analgesics and muscle relaxant for the symptoms. The treatment provided mild relief.  Norco is not helping  He took pain meds, clonazepam last on 4/20  The patient presents for a follow-up of hypogonadism, chronic hypertension, chronic dyslipidemia, type 2 diabetes controlled with medicines. C/o ED  F/u R groin and R thigh pain - xrays -- severe OA  F/u being depressed. Seeing a therapist...  F/u severe pain in R hip worse with standing  It is very hard for him to Work due to hip pain    Wt Readings from Last 3 Encounters:  11/12/12 206 lb (93.441 kg)  11/03/12 221 lb 5.5 oz (100.4 kg)  09/10/12 216 lb (97.977 kg)   BP Readings from Last 3 Encounters:  06/08/13 143/76  11/12/12 123/79  11/07/12 113/69       Review of Systems  Constitutional: Negative for appetite change, fatigue and unexpected weight change.  HENT: Negative for congestion, nosebleeds, sneezing, sore throat and trouble swallowing.   Eyes: Negative for itching and visual disturbance.  Respiratory: Negative for cough.   Cardiovascular: Negative for chest pain, palpitations and leg swelling.  Gastrointestinal: Negative for nausea, diarrhea, blood in stool and abdominal distention.  Genitourinary:  Negative for frequency and hematuria.  Musculoskeletal: Positive for arthralgias, back pain and gait problem. Negative for joint swelling and neck pain.  Skin: Negative for rash.  Neurological: Negative for dizziness, tremors, speech difficulty and weakness.  Psychiatric/Behavioral: Positive for sleep disturbance. Negative for suicidal ideas, dysphoric mood and agitation. The patient is nervous/anxious.        Objective:   Physical Exam  Constitutional: He is oriented to person, place, and time. He appears well-developed. No distress.  HENT:  Mouth/Throat: Oropharynx is clear and moist.  Eyes: Conjunctivae are normal. Pupils are equal, round, and reactive to light.  Neck: Normal range of motion. No JVD present. No thyromegaly present.  Cardiovascular: Normal rate, regular rhythm, normal heart sounds and intact distal pulses.  Exam reveals no gallop and no friction rub.   No murmur heard. Pulmonary/Chest: Effort normal and breath sounds normal. No respiratory distress. He has no wheezes. He has no rales. He exhibits no tenderness.  Abdominal: Soft. Bowel sounds are normal. He exhibits no distension and no mass. There is no tenderness. There is no rebound and no guarding.  Musculoskeletal: Normal range of motion. He exhibits tenderness (R hip, LS pine). He exhibits no edema.  Lymphadenopathy:    He has no cervical adenopathy.  Neurological: He is alert and oriented to person, place, and time. He has normal reflexes. No cranial nerve deficit. He exhibits normal muscle tone. Coordination normal.  Skin: Skin is warm and dry. No rash noted.  Psychiatric: His behavior is normal. Judgment and thought content normal.  tearful    Lab Results  Component Value Date   WBC 8.6 11/07/2012   HGB 11.9* 11/07/2012   HCT 34.0* 11/07/2012   PLT 265 11/07/2012   GLUCOSE 133* 11/07/2012   CHOL 104 11/02/2012   TRIG 258.0* 01/18/2010   HDL 31.00* 01/18/2010   LDLDIRECT 117.3 01/18/2010   LDLCALC 84 12/11/2006    ALT 23 10/31/2012   AST 23 10/31/2012   NA 136 11/07/2012   K 3.8 11/07/2012   CL 95* 11/07/2012   CREATININE 1.00 11/07/2012   BUN 32* 11/07/2012   CO2 33* 11/07/2012   TSH 3.579 11/01/2012   PSA 1.41 10/30/2010   INR 0.99 10/28/2012   HGBA1C 6.0* 11/01/2012         Assessment & Plan:

## 2013-06-14 ENCOUNTER — Encounter (HOSPITAL_COMMUNITY): Payer: Self-pay | Admitting: Emergency Medicine

## 2013-06-14 ENCOUNTER — Emergency Department (HOSPITAL_COMMUNITY): Payer: BC Managed Care – PPO

## 2013-06-14 ENCOUNTER — Emergency Department (HOSPITAL_COMMUNITY)
Admission: EM | Admit: 2013-06-14 | Discharge: 2013-06-14 | Disposition: A | Discharge status: Home / Self Care | Payer: BC Managed Care – PPO | Attending: Emergency Medicine | Admitting: Emergency Medicine

## 2013-06-14 DIAGNOSIS — I10 Essential (primary) hypertension: Secondary | ICD-10-CM | POA: Insufficient documentation

## 2013-06-14 DIAGNOSIS — F411 Generalized anxiety disorder: Secondary | ICD-10-CM | POA: Insufficient documentation

## 2013-06-14 DIAGNOSIS — N509 Disorder of male genital organs, unspecified: Secondary | ICD-10-CM | POA: Insufficient documentation

## 2013-06-14 DIAGNOSIS — N390 Urinary tract infection, site not specified: Secondary | ICD-10-CM | POA: Insufficient documentation

## 2013-06-14 DIAGNOSIS — F3130 Bipolar disorder, current episode depressed, mild or moderate severity, unspecified: Secondary | ICD-10-CM | POA: Insufficient documentation

## 2013-06-14 DIAGNOSIS — Z79899 Other long term (current) drug therapy: Secondary | ICD-10-CM | POA: Insufficient documentation

## 2013-06-14 DIAGNOSIS — N200 Calculus of kidney: Secondary | ICD-10-CM | POA: Insufficient documentation

## 2013-06-14 DIAGNOSIS — R109 Unspecified abdominal pain: Secondary | ICD-10-CM

## 2013-06-14 DIAGNOSIS — E860 Dehydration: Secondary | ICD-10-CM | POA: Insufficient documentation

## 2013-06-14 DIAGNOSIS — N23 Unspecified renal colic: Secondary | ICD-10-CM

## 2013-06-14 DIAGNOSIS — F988 Other specified behavioral and emotional disorders with onset usually occurring in childhood and adolescence: Secondary | ICD-10-CM | POA: Insufficient documentation

## 2013-06-14 DIAGNOSIS — M1610 Unilateral primary osteoarthritis, unspecified hip: Secondary | ICD-10-CM | POA: Insufficient documentation

## 2013-06-14 DIAGNOSIS — F17200 Nicotine dependence, unspecified, uncomplicated: Secondary | ICD-10-CM | POA: Insufficient documentation

## 2013-06-14 DIAGNOSIS — E291 Testicular hypofunction: Secondary | ICD-10-CM | POA: Insufficient documentation

## 2013-06-14 LAB — CBC WITH DIFFERENTIAL/PLATELET
BASOS ABS: 0 10*3/uL (ref 0.0–0.1)
Basophils Relative: 0 % (ref 0–1)
EOS PCT: 3 % (ref 0–5)
Eosinophils Absolute: 0.1 10*3/uL (ref 0.0–0.7)
HCT: 33.3 % — ABNORMAL LOW (ref 39.0–52.0)
Hemoglobin: 11.7 g/dL — ABNORMAL LOW (ref 13.0–17.0)
LYMPHS ABS: 1.5 10*3/uL (ref 0.7–4.0)
LYMPHS PCT: 30 % (ref 12–46)
MCH: 30.5 pg (ref 26.0–34.0)
MCHC: 35.1 g/dL (ref 30.0–36.0)
MCV: 86.7 fL (ref 78.0–100.0)
Monocytes Absolute: 0.6 10*3/uL (ref 0.1–1.0)
Monocytes Relative: 12 % (ref 3–12)
NEUTROS PCT: 55 % (ref 43–77)
Neutro Abs: 2.8 10*3/uL (ref 1.7–7.7)
PLATELETS: 140 10*3/uL — AB (ref 150–400)
RBC: 3.84 MIL/uL — AB (ref 4.22–5.81)
RDW: 13.6 % (ref 11.5–15.5)
WBC: 5.1 10*3/uL (ref 4.0–10.5)

## 2013-06-14 LAB — URINE MICROSCOPIC-ADD ON

## 2013-06-14 LAB — URINALYSIS, ROUTINE W REFLEX MICROSCOPIC
Bilirubin Urine: NEGATIVE
GLUCOSE, UA: 100 mg/dL — AB
KETONES UR: NEGATIVE mg/dL
NITRITE: NEGATIVE
PH: 5.5 (ref 5.0–8.0)
PROTEIN: 30 mg/dL — AB
Specific Gravity, Urine: 1.022 (ref 1.005–1.030)
Urobilinogen, UA: 0.2 mg/dL (ref 0.0–1.0)

## 2013-06-14 LAB — BASIC METABOLIC PANEL
BUN: 31 mg/dL — ABNORMAL HIGH (ref 6–23)
CALCIUM: 9.5 mg/dL (ref 8.4–10.5)
CO2: 24 mEq/L (ref 19–32)
CREATININE: 1.26 mg/dL (ref 0.50–1.35)
Chloride: 101 mEq/L (ref 96–112)
GFR, EST AFRICAN AMERICAN: 78 mL/min — AB (ref >90)
GFR, EST NON AFRICAN AMERICAN: 67 mL/min — AB (ref >90)
Glucose, Bld: 162 mg/dL — ABNORMAL HIGH (ref 70–99)
Potassium: 4.6 mEq/L (ref 3.7–5.3)
SODIUM: 139 meq/L (ref 137–147)

## 2013-06-14 MED ORDER — SODIUM CHLORIDE 0.9 % IV BOLUS (SEPSIS)
1000.0000 mL | Freq: Once | INTRAVENOUS | Status: AC
  Administered 2013-06-14: 1000 mL via INTRAVENOUS

## 2013-06-14 MED ORDER — CIPROFLOXACIN HCL 500 MG PO TABS: 500.0000 mg | ORAL_TABLET | Freq: Two times a day (BID) | ORAL | Status: DC

## 2013-06-14 MED ORDER — KETOROLAC TROMETHAMINE 30 MG/ML IJ SOLN
30.0000 mg | Freq: Once | INTRAMUSCULAR | Status: AC
  Administered 2013-06-14: 30 mg via INTRAVENOUS
  Filled 2013-06-14: qty 1
  Filled 2013-06-14: qty 2

## 2013-06-14 MED ORDER — NAPROXEN 500 MG PO TABS: 500.0000 mg | ORAL_TABLET | Freq: Two times a day (BID) | ORAL | Status: DC

## 2013-06-14 MED ORDER — FENTANYL CITRATE 0.05 MG/ML IJ SOLN
50.0000 ug | Freq: Once | INTRAMUSCULAR | Status: AC
  Administered 2013-06-14: 50 ug via INTRAVENOUS
  Filled 2013-06-14: qty 2

## 2013-06-14 MED ORDER — TAMSULOSIN HCL 0.4 MG PO CAPS: 0.4000 mg | ORAL_CAPSULE | Freq: Two times a day (BID) | ORAL | Status: DC

## 2013-06-14 NOTE — ED Notes (Signed)
Pt states sudden onset of weakness, rt flank pain, rt scrotal pain.  Also had fall recently but does have renal history

## 2013-06-14 NOTE — ED Provider Notes (Signed)
CSN: [id number]     Arrival date & time 06/14/13  1734 History   First MD Initiated Contact with Patient 06/14/13 1747     Chief Complaint  Patient presents with  . Testicle Pain  . Flank Pain     (Consider location/radiation/quality/duration/timing/severity/associated sxs/prior Treatment) HPI 48 yo male presents with Right flank pain that radiates to RIGHT testicle. Pain started last night but has progressively worsened today. Pain described as "stabbing" pain that is constant rated at 10/10. Pain exacerbated by "standing straight up". Patient was recently seen here last week for fall at work that resulted in Right hip/groin/right knee/Lower back pain. Xrays negative at that time for any acute findings. Denies fever/chills, N/V, Chest pain/SOB, abdominal pain, bloody stools. Patient admits to hematuria and dysuria earlier today that has since resolved.  Patient states that he feels like he passed something "like dried blood" in his urine. Patient admits to hx of kidney stones.   PMH significant for Bipolar, Anxiety, Depression, and arthritis of RIght hip. Surgical hx of Lumbar fusion at L4-L5. Patient states he quit smoking but smoke 1 cigarette today.  Denies alcohol use.  Past Medical History  Diagnosis Date  . Anxiety   . Hypertension   . Depression     bipolar [facility]  . Hypogonadism male   . ADD (attention deficit disorder)   . Microscopic hematuria     hereditary s/p Urology eval  . Bipolar 1 disorder   . Arthritis     right hip   Past Surgical History  Procedure Laterality Date  . Lumbar disc surgery    . Back surgery     Family History  Problem Relation Age of Onset  . Diabetes Father   . Cancer Mother     died of melanoma with mets  . Cervical cancer Sister   . Diabetes Sister    History  Substance Use Topics  . Smoking status: Current Every Day Smoker -- 0.50 packs/day for 25 years    Types: Cigarettes  . Smokeless tobacco: Never Used  . Alcohol Use:  No    Review of Systems  Genitourinary: Positive for testicular pain.  All other systems reviewed and are negative.      Allergies  Review of patient's allergies indicates no known allergies.  Home Medications   Current Outpatient Rx  Name  Route  Sig  Dispense  Refill  . buPROPion (WELLBUTRIN SR) 150 MG 12 hr tablet   Oral   Take 150 mg by mouth 3 (three) times daily.          . cholecalciferol (VITAMIN D) 1000 UNITS tablet   Oral   Take 2,000 Units by mouth every morning.          . clonazePAM (KLONOPIN) 2 MG tablet   Oral   Take 0.5-1 tablets (1-2 mg total) by mouth 3 (three) times daily as needed for anxiety.   90 tablet   2   . cyanocobalamin 500 MCG tablet   Oral   Take 500 mcg by mouth every morning.          . divalproex (DEPAKOTE ER) 500 MG 24 hr tablet   Oral   Take 1,000 mg by mouth at bedtime.         Donnie Aho (GLUCOSAMINE MSM COMPLEX PO)   Oral   Take 2 capsules by mouth 2 (two) times daily.          Marland Kitchen HYDROmorphone (DILAUDID) 4 MG tablet  Oral   Take 1 tablet (4 mg total) by mouth 3 (three) times daily as needed for severe pain.   90 tablet   0   . lisdexamfetamine (VYVANSE) 20 MG capsule   Oral   Take 20 mg by mouth every morning.         . Multiple Vitamin (MULTIVITAMIN WITH MINERALS) TABS   Oral   Take 1 tablet by mouth every morning.         Marland Kitchen QUEtiapine (SEROQUEL XR) 200 MG 24 hr tablet   Oral   Take 600 mg by mouth at bedtime.         . tadalafil (CIALIS) 5 MG tablet   Oral   Take 5 mg by mouth daily.         . Testosterone (ANDROGEL) 20.25 MG/1.25GM (1.62%) GEL   Transdermal   Place 3 application onto the skin daily.   215 g   1     patient number [id number]   . ciprofloxacin (CIPRO) 500 MG tablet   Oral   Take 1 tablet (500 mg total) by mouth 2 (two) times daily. One po bid x 7 days   14 tablet   0   . naproxen (NAPROSYN) 500 MG tablet   Oral   Take 1 tablet (500 mg total) by  mouth 2 (two) times daily with a meal.   30 tablet   0   . tamsulosin (FLOMAX) 0.4 MG CAPS capsule   Oral   Take 1 capsule (0.4 mg total) by mouth 2 (two) times daily.   10 capsule   0    BP 110/60  Pulse 96  Temp(Src) 98.5 F (36.9 C) (Oral)  Resp 18  SpO2 97% Physical Exam  Nursing note and vitals reviewed. Constitutional: He is oriented to person, place, and time. He appears well-developed and well-nourished. No distress.  HENT:  Head: Normocephalic and atraumatic.  Eyes: Conjunctivae are normal. No scleral icterus.  Neck: Normal range of motion. Neck supple. No JVD present. No tracheal deviation present.  Cardiovascular: Normal rate and regular rhythm.  Exam reveals no gallop and no friction rub.   No murmur heard. Pulmonary/Chest: Effort normal and breath sounds normal. No respiratory distress. He has no wheezes. He has no rhonchi. He has no rales.  Abdominal: Soft. Normal appearance. He exhibits no distension. Bowel sounds are decreased. There is no hepatosplenomegaly. There is tenderness in the right lower quadrant. There is no rigidity, no rebound, no guarding, no CVA tenderness, no tenderness at McBurney's point and negative Murphy's sign. Hernia confirmed negative in the right inguinal area and confirmed negative in the left inguinal area.  Genitourinary: Testes normal and penis normal. Right testis shows no mass, no swelling and no tenderness. Left testis shows no mass, no swelling and no tenderness. No penile erythema or penile tenderness. No discharge found.  Musculoskeletal: Normal range of motion. He exhibits no edema.  Neurological: He is alert and oriented to person, place, and time.  Skin: Skin is warm and dry. He is not diaphoretic.  Psychiatric: He has a normal mood and affect. His behavior is normal.    ED Course  Procedures (including critical care time) Labs Review Labs Reviewed  BASIC METABOLIC PANEL - Abnormal; Notable for the following:    Glucose,  Bld 162 (*)    BUN 31 (*)    GFR calc non Af Amer 67 (*)    GFR calc Af Amer 78 (*)    All other  components within normal limits  CBC WITH DIFFERENTIAL - Abnormal; Notable for the following:    RBC 3.84 (*)    Hemoglobin 11.7 (*)    HCT 33.3 (*)    Platelets 140 (*)    All other components within normal limits  URINALYSIS, ROUTINE W REFLEX MICROSCOPIC - Abnormal; Notable for the following:    APPearance TURBID (*)    Glucose, UA 100 (*)    Hgb urine dipstick LARGE (*)    Protein, ur 30 (*)    Leukocytes, UA LARGE (*)    All other components within normal limits  URINE MICROSCOPIC-ADD ON - Abnormal; Notable for the following:    Squamous Epithelial / LPF FEW (*)    Bacteria, UA MANY (*)    All other components within normal limits   Imaging Review Ct Abdomen Pelvis Wo Contrast  06/14/2013   CLINICAL DATA:  Right-sided flank pain. History of kidney stones. Testicular pain. Microscopic hematuria.  EXAM: CT ABDOMEN AND PELVIS WITHOUT CONTRAST  TECHNIQUE: Multidetector CT imaging of the abdomen and pelvis was performed following the standard protocol without intravenous contrast.  COMPARISON:  CT ABDOMEN W/O CM dated 10/09/2006  FINDINGS: Lower Chest: Probable scarring at the anterior lateral left lung base. New since 06/20/ 2008  Normal heart size without pericardial or pleural effusion.  Abdomen/Pelvis: Normal uninfused appearance of the liver, spleen, stomach, pancreas, gallbladder, biliary tract, adrenal glands.  3 mm inter/lower pole right renal collecting system calculus which is new. No hydronephrosis. No hydroureter or ureteric calculi. No bladder calculi.  Mild but age advanced aortic atherosclerosis. No retroperitoneal or retrocrural adenopathy. Scattered colonic diverticula. Normal terminal ileum and appendix. Normal small bowel without abdominal ascites.  No pelvic adenopathy. Normal prostate, without significant free pelvic fluid.  Bones/Musculoskeletal: Bilateral hip osteoarthritis,  age advanced. Lumbar spine fixation. The L4 right-sided screw terminates lateral to the cortex. This is unchanged. Degenerative disc disease at the lumbosacral junction.  IMPRESSION: 1. Right nephrolithiasis. No urinary tract obstruction or ureteric stone. 2. Normal appendix, without other explanation for right-sided pain.   Electronically Signed   By: Abigail Miyamoto M.D.   On: 06/14/2013 19:56    EKG Interpretation   None       MDM   Final diagnoses:  Flank pain  Nephrolithiasis  Dehydration  Ureteral colic  UTI (urinary tract infection)   Patient afebrile with normal VS.  No leukocytosis. UA shows many bacteria with large leukocytes with large hgb. Suggestive of UTI.  CT shows  Right nephrolithiasis. No urinary tract obstruction or ureteric stone. Normal appendix.  Suspect ureteral colic with UTI. Plan to treat patient's UTI and ureteral colic. Plan to have patient follow up with Urology.  Doubt testicular torsion, doubt appendicitis, doubt pyelonephritis, doubt gonorrhea.  Discussed labs, and exam findings with patient. Advised follow up with Urology.Strict return precautions given for fever/chills, worsening pain, or difficulty urinating.Patient agrees with plan. Discharged in good condition.    Meds given in ED:  Medications  sodium chloride 0.9 % bolus 1,000 mL (0 mLs Intravenous Stopped 06/14/13 2055)  ketorolac (TORADOL) 30 MG/ML injection 30 mg (30 mg Intravenous Given 06/14/13 1855)  fentaNYL (SUBLIMAZE) injection 50 mcg (50 mcg Intravenous Given 06/14/13 2047)    Discharge Medication List as of 06/14/2013  9:07 PM    START taking these medications   Details  ciprofloxacin (CIPRO) 500 MG tablet Take 1 tablet (500 mg total) by mouth 2 (two) times daily. One po bid x 7 days, Starting  06/14/2013, Until Discontinued, Print    naproxen (NAPROSYN) 500 MG tablet Take 1 tablet (500 mg total) by mouth 2 (two) times daily with a meal., Starting 06/14/2013, Until Discontinued, Print     tamsulosin (FLOMAX) 0.4 MG CAPS capsule Take 1 capsule (0.4 mg total) by mouth 2 (two) times daily., Starting 06/14/2013, Until Discontinued, Print            Sherrie George, [place] 06/15/13 1153

## 2013-06-14 NOTE — Discharge Instructions (Signed)
Follow up with Urology as listed above in "follow up" section. Make appointment ASAP. Take medications as directed. Return to ED should you develop fever/chills, worsening abdominal pain, or become unable to urinate.

## 2013-06-14 NOTE — ED Notes (Signed)
Pt states that he is aware a urine sample is needed but is unable to urinate at this time.  Gave pt urinal, he was informed to ring call bell when finished.RN notified

## 2013-06-17 NOTE — ED Provider Notes (Signed)
Medical screening examination/treatment/procedure(s) were performed by non-physician practitioner and as supervising physician I was immediately available for consultation/collaboration.  EKG Interpretation  None  Rolland Porter, MD, Abram SanderJanice Norrie, MD 06/17/13 [phone]

## 2013-06-20 ENCOUNTER — Encounter: Payer: Self-pay | Admitting: Internal Medicine

## 2013-06-20 NOTE — Assessment & Plan Note (Signed)
Continue with current prescription therapy as reflected on the Med list.  

## 2013-06-30 ENCOUNTER — Encounter (HOSPITAL_COMMUNITY): Payer: Self-pay | Admitting: Emergency Medicine

## 2013-06-30 ENCOUNTER — Emergency Department (HOSPITAL_COMMUNITY)
Admission: EM | Admit: 2013-06-30 | Discharge: 2013-07-01 | Disposition: A | Discharge status: Home / Self Care | Payer: BC Managed Care – PPO | Attending: Emergency Medicine | Admitting: Emergency Medicine

## 2013-06-30 ENCOUNTER — Emergency Department (HOSPITAL_COMMUNITY): Payer: BC Managed Care – PPO

## 2013-06-30 DIAGNOSIS — Z87442 Personal history of urinary calculi: Secondary | ICD-10-CM | POA: Insufficient documentation

## 2013-06-30 DIAGNOSIS — Z862 Personal history of diseases of the blood and blood-forming organs and certain disorders involving the immune mechanism: Secondary | ICD-10-CM | POA: Insufficient documentation

## 2013-06-30 DIAGNOSIS — Z8739 Personal history of other diseases of the musculoskeletal system and connective tissue: Secondary | ICD-10-CM | POA: Insufficient documentation

## 2013-06-30 DIAGNOSIS — F988 Other specified behavioral and emotional disorders with onset usually occurring in childhood and adolescence: Secondary | ICD-10-CM | POA: Insufficient documentation

## 2013-06-30 DIAGNOSIS — N390 Urinary tract infection, site not specified: Secondary | ICD-10-CM | POA: Insufficient documentation

## 2013-06-30 DIAGNOSIS — F411 Generalized anxiety disorder: Secondary | ICD-10-CM | POA: Insufficient documentation

## 2013-06-30 DIAGNOSIS — Z8639 Personal history of other endocrine, nutritional and metabolic disease: Secondary | ICD-10-CM | POA: Insufficient documentation

## 2013-06-30 DIAGNOSIS — G8911 Acute pain due to trauma: Secondary | ICD-10-CM | POA: Insufficient documentation

## 2013-06-30 DIAGNOSIS — I10 Essential (primary) hypertension: Secondary | ICD-10-CM | POA: Insufficient documentation

## 2013-06-30 DIAGNOSIS — R209 Unspecified disturbances of skin sensation: Secondary | ICD-10-CM | POA: Insufficient documentation

## 2013-06-30 DIAGNOSIS — Z9889 Other specified postprocedural states: Secondary | ICD-10-CM | POA: Insufficient documentation

## 2013-06-30 DIAGNOSIS — M549 Dorsalgia, unspecified: Secondary | ICD-10-CM | POA: Insufficient documentation

## 2013-06-30 DIAGNOSIS — R110 Nausea: Secondary | ICD-10-CM | POA: Insufficient documentation

## 2013-06-30 DIAGNOSIS — Z79899 Other long term (current) drug therapy: Secondary | ICD-10-CM | POA: Insufficient documentation

## 2013-06-30 DIAGNOSIS — F17200 Nicotine dependence, unspecified, uncomplicated: Secondary | ICD-10-CM | POA: Insufficient documentation

## 2013-06-30 DIAGNOSIS — F319 Bipolar disorder, unspecified: Secondary | ICD-10-CM | POA: Insufficient documentation

## 2013-06-30 LAB — URINE MICROSCOPIC-ADD ON

## 2013-06-30 LAB — URINALYSIS, ROUTINE W REFLEX MICROSCOPIC
BILIRUBIN URINE: NEGATIVE
Glucose, UA: NEGATIVE mg/dL
KETONES UR: NEGATIVE mg/dL
Nitrite: NEGATIVE
PH: 5.5 (ref 5.0–8.0)
Protein, ur: NEGATIVE mg/dL
SPECIFIC GRAVITY, URINE: 1.021 (ref 1.005–1.030)
Urobilinogen, UA: 0.2 mg/dL (ref 0.0–1.0)

## 2013-06-30 MED ORDER — CYCLOBENZAPRINE HCL 10 MG PO TABS
10.0000 mg | ORAL_TABLET | Freq: Once | ORAL | Status: AC
  Administered 2013-06-30: 10 mg via ORAL
  Filled 2013-06-30: qty 1

## 2013-06-30 NOTE — ED Notes (Signed)
Pt reports hx of spinal fusion and states that he fell x2 weeks ago and has had increased back pain since that time. Pt states that the pain is over his R flank, extends down his R leg and makes his R leg feel numb. Pt states that he was dx with kidney stones, "but I know it isn't related to that." Pt a&o x4, ambulatory to triage

## 2013-07-01 ENCOUNTER — Ambulatory Visit: Payer: Self-pay | Admitting: Internal Medicine

## 2013-07-01 LAB — URINE CULTURE
COLONY COUNT: NO GROWTH
Culture: NO GROWTH

## 2013-07-01 MED ORDER — SULFAMETHOXAZOLE-TRIMETHOPRIM 800-160 MG PO TABS: 1.0000 | ORAL_TABLET | Freq: Two times a day (BID) | ORAL | Status: DC

## 2013-07-01 NOTE — Discharge Instructions (Signed)
Urinary Tract Infection  Urinary tract infections (UTIs) can develop anywhere along your urinary tract. Your urinary tract is your body's drainage system for removing wastes and extra water. Your urinary tract includes two kidneys, two ureters, a bladder, and a urethra. Your kidneys are a pair of bean-shaped organs. Each kidney is about the size of your fist. They are located below your ribs, one on each side of your spine.  CAUSES  Infections are caused by microbes, which are microscopic organisms, including fungi, viruses, and bacteria. These organisms are so small that they can only be seen through a microscope. Bacteria are the microbes that most commonly cause UTIs.  SYMPTOMS   Symptoms of UTIs may vary by age and gender of the patient and by the location of the infection. Symptoms in young women typically include a frequent and intense urge to urinate and a painful, burning feeling in the bladder or urethra during urination. Older women and men are more likely to be tired, shaky, and weak and have muscle aches and abdominal pain. A fever may mean the infection is in your kidneys. Other symptoms of a kidney infection include pain in your back or sides below the ribs, nausea, and vomiting.  DIAGNOSIS  To diagnose a UTI, your caregiver will ask you about your symptoms. Your caregiver also will ask to provide a urine sample. The urine sample will be tested for bacteria and white blood cells. White blood cells are made by your body to help fight infection.  TREATMENT   Typically, UTIs can be treated with medication. Because most UTIs are caused by a bacterial infection, they usually can be treated with the use of antibiotics. The choice of antibiotic and length of treatment depend on your symptoms and the type of bacteria causing your infection.  HOME CARE INSTRUCTIONS   If you were prescribed antibiotics, take them exactly as your caregiver instructs you. Finish the medication even if you feel better after you  have only taken some of the medication.   Drink enough water and fluids to keep your urine clear or pale yellow.   Avoid caffeine, tea, and carbonated beverages. They tend to irritate your bladder.   Empty your bladder often. Avoid holding urine for long periods of time.   Empty your bladder before and after sexual intercourse.   After a bowel movement, women should cleanse from front to back. Use each tissue only once.  SEEK MEDICAL CARE IF:    You have back pain.   You develop a fever.   Your symptoms do not begin to resolve within 3 days.  SEEK IMMEDIATE MEDICAL CARE IF:    You have severe back pain or lower abdominal pain.   You develop chills.   You have nausea or vomiting.   You have continued burning or discomfort with urination.  MAKE SURE YOU:    Understand these instructions.   Will watch your condition.   Will get help right away if you are not doing well or get worse.  Document Released: 01/15/2005 Document Revised: 10/07/2011 Document Reviewed: 05/16/2011  [employer] Patient Information 2014 [employer]

## 2013-07-01 NOTE — ED Provider Notes (Signed)
Medical screening examination/treatment/procedure(s) were performed by non-physician practitioner and as supervising physician I was immediately available for consultation/collaboration.   EKG Interpretation None      Rolland Porter, MD, Abram Sander   Janice Norrie, MD 07/01/13 Shelah Lewandowsky

## 2013-07-01 NOTE — ED Notes (Signed)
Patient is alert and oriented x3.  He was given DC instructions and follow up visit instructions.  Patient gave verbal understanding.  He was DC ambulatory under his own power to home.  V/S stable.  He was not showing any signs of distress on DC 

## 2013-07-01 NOTE — ED Provider Notes (Signed)
CSN: [id number]     Arrival date & time 06/30/13  1947 History   First MD Initiated Contact with Patient 06/30/13 2042     Chief Complaint  Patient presents with  . Back Pain  . Numbness     (Consider location/radiation/quality/duration/timing/severity/associated sxs/prior Treatment) Patient is a 48 y.o. male presenting with flank pain. The history is provided by the patient. No language interpreter was used.  Flank Pain The current episode started 1 to 4 weeks ago. Associated symptoms include nausea. Pertinent negatives include no chills, fever or vomiting. Associated symptoms comments: He returns to the ED with complaint of persistent right flank and back pain since a fall 2 weeks ago. He was seen in the emergency department at that time. He reports a history of kidney stones but does not think this feels like that. No fever. He reports limited nausea without vomiting. He denies urinary symptoms. The pain is worse when he moves and better when he is at rest. He states that hydrocodone makes the pain better. .    Past Medical History  Diagnosis Date  . Anxiety   . Hypertension   . Depression     bipolar [facility]  . Hypogonadism male   . ADD (attention deficit disorder)   . Microscopic hematuria     hereditary s/p Urology eval  . Bipolar 1 disorder   . Arthritis     right hip   Past Surgical History  Procedure Laterality Date  . Lumbar disc surgery    . Back surgery     Family History  Problem Relation Age of Onset  . Diabetes Father   . Cancer Mother     died of melanoma with mets  . Cervical cancer Sister   . Diabetes Sister    History  Substance Use Topics  . Smoking status: Current Every Day Smoker -- 0.50 packs/day for 25 years    Types: Cigarettes  . Smokeless tobacco: Never Used  . Alcohol Use: No    Review of Systems  Constitutional: Negative for fever and chills.  HENT: Negative.   Respiratory: Negative.   Cardiovascular: Negative.    Gastrointestinal: Positive for nausea. Negative for vomiting.  Genitourinary: Positive for flank pain. Negative for dysuria and hematuria.  Musculoskeletal: Positive for back pain.  Skin: Negative.   Neurological: Negative.       Allergies  Review of patient's allergies indicates no known allergies.  Home Medications   Current Outpatient Rx  Name  Route  Sig  Dispense  Refill  . buPROPion (WELLBUTRIN SR) 150 MG 12 hr tablet   Oral   Take 150 mg by mouth 3 (three) times daily.          . cholecalciferol (VITAMIN D) 1000 UNITS tablet   Oral   Take 2,000 Units by mouth every morning.          . clonazePAM (KLONOPIN) 2 MG tablet   Oral   Take 0.5-1 tablets (1-2 mg total) by mouth 3 (three) times daily as needed for anxiety.   90 tablet   2   . cyanocobalamin 500 MCG tablet   Oral   Take 500 mcg by mouth every morning.          . divalproex (DEPAKOTE ER) 500 MG 24 hr tablet   Oral   Take 1,000 mg by mouth at bedtime.         Donnie Aho (GLUCOSAMINE MSM COMPLEX PO)   Oral   Take 2 capsules  by mouth 2 (two) times daily.          Marland Kitchen HYDROmorphone (DILAUDID) 4 MG tablet   Oral   Take 1 tablet (4 mg total) by mouth 3 (three) times daily as needed for severe pain.   90 tablet   0   . lisdexamfetamine (VYVANSE) 20 MG capsule   Oral   Take 20 mg by mouth every morning.         . Multiple Vitamin (MULTIVITAMIN WITH MINERALS) TABS   Oral   Take 1 tablet by mouth every morning.         Marland Kitchen QUEtiapine (SEROQUEL XR) 200 MG 24 hr tablet   Oral   Take 600 mg by mouth at bedtime.         . tadalafil (CIALIS) 5 MG tablet   Oral   Take 5 mg by mouth daily.         . Testosterone (ANDROGEL) 20.25 MG/1.25GM (1.62%) GEL   Transdermal   Place 3 application onto the skin daily.   215 g   1     patient number [id number]   . tamsulosin (FLOMAX) 0.4 MG CAPS capsule   Oral   Take 0.4 mg by mouth 2 (two) times daily.          BP 112/62   Pulse 70  Temp(Src) 97.5 F (36.4 C) (Oral)  Resp 16  SpO2 97% Physical Exam  Constitutional: He is oriented to person, place, and time. He appears well-developed and well-nourished.  Neck: Normal range of motion.  Pulmonary/Chest: Effort normal.  Abdominal: Soft. He exhibits no mass. There is no tenderness.  Musculoskeletal: Normal range of motion.  Right paraspinal tenderness at lower thoracic/upper lumbar region. No swelling. Tenderness extends to flank.   Neurological: He is alert and oriented to person, place, and time. He has normal reflexes. No sensory deficit.  Skin: Skin is warm and dry.  Psychiatric: He has a normal mood and affect.    ED Course  Procedures (including critical care time) Labs Review Labs Reviewed  URINALYSIS, ROUTINE W REFLEX MICROSCOPIC - Abnormal; Notable for the following:    APPearance CLOUDY (*)    Hgb urine dipstick LARGE (*)    Leukocytes, UA LARGE (*)    All other components within normal limits  URINE MICROSCOPIC-ADD ON - Abnormal; Notable for the following:    Bacteria, UA FEW (*)    All other components within normal limits  URINE CULTURE   Imaging Review No results found.   EKG Interpretation None      MDM   Final diagnoses:  None    1. Back pain 2. UTI  [place] Controlled substance database consulted. The patient receives regular dosing of narcotic pain medication, clonazepam from Dr. [name], last receiving #90 4 mg Dilaudid on 06/17/13, #90 2 mg Clonazepam on 06/13/13 and #180 1 mg Clonazepam on 06/17/13. Discussed that we would not prescribe any narcotic pain medication through the emergency department tonight.   UA shows TNTC WBC's and 11-20 RBC's. Last CT scan w/o CM (stone study) done on 06/14/13 sowing nephrolithiasis. Because of this a renal ultrasound was obtained and shows no hydronephrosis. He is afebrile - doubt pyelonephrosis. Will treat UTI with abx and encourage follow up with Dr. [name].     Dewaine Oats,  PA-C 07/01/13 0023

## 2013-07-04 ENCOUNTER — Encounter: Payer: Self-pay | Admitting: Internal Medicine

## 2013-07-08 ENCOUNTER — Ambulatory Visit (INDEPENDENT_AMBULATORY_CARE_PROVIDER_SITE_OTHER): Payer: BC Managed Care – PPO | Admitting: Internal Medicine

## 2013-07-08 ENCOUNTER — Encounter: Payer: Self-pay | Admitting: Internal Medicine

## 2013-07-08 ENCOUNTER — Ambulatory Visit: Payer: Self-pay | Admitting: Internal Medicine

## 2013-07-08 VITALS — BP 138/92 | HR 72 | Temp 98.6°F | Resp 16 | Wt 213.0 lb

## 2013-07-08 DIAGNOSIS — I10 Essential (primary) hypertension: Secondary | ICD-10-CM

## 2013-07-08 DIAGNOSIS — M25552 Pain in left hip: Principal | ICD-10-CM

## 2013-07-08 DIAGNOSIS — M25559 Pain in unspecified hip: Secondary | ICD-10-CM

## 2013-07-08 DIAGNOSIS — F319 Bipolar disorder, unspecified: Secondary | ICD-10-CM

## 2013-07-08 DIAGNOSIS — M25551 Pain in right hip: Secondary | ICD-10-CM

## 2013-07-08 MED ORDER — HYDROCODONE BITARTRATE ER 30 MG PO T24A: 1.0000 | EXTENDED_RELEASE_TABLET | ORAL | Status: DC

## 2013-07-08 MED ORDER — CLONAZEPAM 2 MG PO TBDP: 2.0000 mg | ORAL_TABLET | Freq: Three times a day (TID) | ORAL | Status: DC | PRN

## 2013-07-08 NOTE — Assessment & Plan Note (Addendum)
Will try Hysingla

## 2013-07-08 NOTE — Progress Notes (Signed)
Pre visit review using our clinic review tool, if applicable. No additional management support is needed unless otherwise documented below in the visit note. 

## 2013-07-08 NOTE — Progress Notes (Signed)
Subjective:    HPI   He fell at work on 06/08/13 - LBP - workman's comp  Hydrocodone, hydromorphone - not helping  Hip Pain  The pain is present in the right thigh and right leg. The pain is at a severity of 9/10. The pain is severe. The pain has been constant since onset. The treatment provided moderate relief. He needs surgery, however he would loose job if he had surgery. Back Pain  This is a chronic problem. The current episode started more than 1 year ago. The pain is present in the lumbar spine and gluteal. The quality of the pain is described as aching and burning. The pain is at a severity of 9/10. The pain is severe. The symptoms are aggravated by bending, position, standing and twisting. Stiffness is present all day. Pertinent negatives include no chest pain or weakness. He has tried analgesics and muscle relaxant for the symptoms. The treatment provided mild relief.  Norco is not helping  He took pain meds, clonazepam last on 4/20  The patient presents for a follow-up of hypogonadism, chronic hypertension, chronic dyslipidemia, type 2 diabetes controlled with medicines. C/o ED  F/u R groin and R thigh pain - xrays -- severe OA  F/u being depressed. Seeing a therapist...  F/u severe pain in R hip worse with standing  It is very hard for him to Work due to hip pain    Wt Readings from Last 3 Encounters:  07/08/13 213 lb (96.616 kg)  11/12/12 206 lb (93.441 kg)  11/03/12 221 lb 5.5 oz (100.4 kg)   BP Readings from Last 3 Encounters:  07/08/13 138/92  07/01/13 112/62  06/14/13 110/60       Review of Systems  Constitutional: Negative for appetite change, fatigue and unexpected weight change.  HENT: Negative for congestion, nosebleeds, sneezing, sore throat and trouble swallowing.   Eyes: Negative for itching and visual disturbance.  Respiratory: Negative for cough.   Cardiovascular: Negative for chest pain, palpitations and leg swelling.  Gastrointestinal: Negative  for nausea, diarrhea, blood in stool and abdominal distention.  Genitourinary: Negative for frequency and hematuria.  Musculoskeletal: Positive for arthralgias, back pain and gait problem. Negative for joint swelling and neck pain.  Skin: Negative for rash.  Neurological: Negative for dizziness, tremors, speech difficulty and weakness.  Psychiatric/Behavioral: Positive for sleep disturbance. Negative for suicidal ideas, dysphoric mood and agitation. The patient is nervous/anxious.        Objective:   Physical Exam  Constitutional: He is oriented to person, place, and time. He appears well-developed. No distress.  HENT:  Mouth/Throat: Oropharynx is clear and moist.  Eyes: Conjunctivae are normal. Pupils are equal, round, and reactive to light.  Neck: Normal range of motion. No JVD present. No thyromegaly present.  Cardiovascular: Normal rate, regular rhythm, normal heart sounds and intact distal pulses.  Exam reveals no gallop and no friction rub.   No murmur heard. Pulmonary/Chest: Effort normal and breath sounds normal. No respiratory distress. He has no wheezes. He has no rales. He exhibits no tenderness.  Abdominal: Soft. Bowel sounds are normal. He exhibits no distension and no mass. There is no tenderness. There is no rebound and no guarding.  Musculoskeletal: Normal range of motion. He exhibits tenderness (R hip, LS pine). He exhibits no edema.  Lymphadenopathy:    He has no cervical adenopathy.  Neurological: He is alert and oriented to person, place, and time. He has normal reflexes. No cranial nerve deficit. He exhibits normal muscle  tone. Coordination normal.  Skin: Skin is warm and dry. No rash noted.  Psychiatric: His behavior is normal. Judgment and thought content normal.  tearful    Lab Results  Component Value Date   WBC 5.1 06/14/2013   HGB 11.7* 06/14/2013   HCT 33.3* 06/14/2013   PLT 140* 06/14/2013   GLUCOSE 162* 06/14/2013   CHOL 104 11/02/2012   TRIG 258.0*  01/18/2010   HDL 31.00* 01/18/2010   LDLDIRECT 117.3 01/18/2010   LDLCALC 84 12/11/2006   ALT 23 10/31/2012   AST 23 10/31/2012   NA 139 06/14/2013   K 4.6 06/14/2013   CL 101 06/14/2013   CREATININE 1.26 06/14/2013   BUN 31* 06/14/2013   CO2 24 06/14/2013   TSH 3.579 11/01/2012   PSA 1.41 10/30/2010   INR 0.99 10/28/2012   HGBA1C 6.0* 11/01/2012         Assessment & Plan:

## 2013-07-09 NOTE — Assessment & Plan Note (Signed)
Will try Hysingla

## 2013-07-09 NOTE — Assessment & Plan Note (Signed)
Continue with current prescription therapy as reflected on the Med list.  

## 2013-07-11 ENCOUNTER — Telehealth: Payer: Self-pay | Admitting: Internal Medicine

## 2013-07-11 NOTE — Telephone Encounter (Signed)
Relevant patient education assigned to patient using Emmi. ° °

## 2013-07-14 ENCOUNTER — Emergency Department (HOSPITAL_COMMUNITY)
Admission: EM | Admit: 2013-07-14 | Discharge: 2013-07-14 | Disposition: A | Discharge status: Home / Self Care | Payer: BC Managed Care – PPO | Attending: Emergency Medicine | Admitting: Emergency Medicine

## 2013-07-14 ENCOUNTER — Encounter (HOSPITAL_COMMUNITY): Payer: Self-pay | Admitting: Emergency Medicine

## 2013-07-14 DIAGNOSIS — M545 Low back pain, unspecified: Secondary | ICD-10-CM | POA: Insufficient documentation

## 2013-07-14 DIAGNOSIS — Z9889 Other specified postprocedural states: Secondary | ICD-10-CM | POA: Insufficient documentation

## 2013-07-14 DIAGNOSIS — F17200 Nicotine dependence, unspecified, uncomplicated: Secondary | ICD-10-CM | POA: Insufficient documentation

## 2013-07-14 DIAGNOSIS — M1610 Unilateral primary osteoarthritis, unspecified hip: Secondary | ICD-10-CM | POA: Insufficient documentation

## 2013-07-14 DIAGNOSIS — I10 Essential (primary) hypertension: Secondary | ICD-10-CM | POA: Insufficient documentation

## 2013-07-14 DIAGNOSIS — M549 Dorsalgia, unspecified: Secondary | ICD-10-CM

## 2013-07-14 DIAGNOSIS — F411 Generalized anxiety disorder: Secondary | ICD-10-CM | POA: Insufficient documentation

## 2013-07-14 DIAGNOSIS — F988 Other specified behavioral and emotional disorders with onset usually occurring in childhood and adolescence: Secondary | ICD-10-CM | POA: Insufficient documentation

## 2013-07-14 DIAGNOSIS — F319 Bipolar disorder, unspecified: Secondary | ICD-10-CM | POA: Insufficient documentation

## 2013-07-14 DIAGNOSIS — E291 Testicular hypofunction: Secondary | ICD-10-CM | POA: Insufficient documentation

## 2013-07-14 DIAGNOSIS — Z79899 Other long term (current) drug therapy: Secondary | ICD-10-CM | POA: Insufficient documentation

## 2013-07-14 DIAGNOSIS — G8929 Other chronic pain: Secondary | ICD-10-CM | POA: Insufficient documentation

## 2013-07-14 MED ORDER — KETOROLAC TROMETHAMINE 30 MG/ML IJ SOLN
30.0000 mg | Freq: Once | INTRAMUSCULAR | Status: AC
  Administered 2013-07-14: 30 mg via INTRAMUSCULAR
  Filled 2013-07-14: qty 1

## 2013-07-14 NOTE — Discharge Instructions (Signed)
Please followup with your primary care providers and specialists for continued evaluation and treatment of your back pain.    Chronic Back Pain  When back pain lasts longer than 3 months, it is called chronic back pain.People with chronic back pain often go through certain periods that are more intense (flare-ups).  CAUSES Chronic back pain can be caused by wear and tear (degeneration) on different structures in your back. These structures include:  The bones of your spine (vertebrae) and the joints surrounding your spinal cord and nerve roots (facets).  The strong, fibrous tissues that connect your vertebrae (ligaments). Degeneration of these structures may result in pressure on your nerves. This can lead to constant pain. HOME CARE INSTRUCTIONS  Avoid bending, heavy lifting, prolonged sitting, and activities which make the problem worse.  Take brief periods of rest throughout the day to reduce your pain. Lying down or standing usually is better than sitting while you are resting.  Take over-the-counter or prescription medicines only as directed by your caregiver. SEEK IMMEDIATE MEDICAL CARE IF:   You have weakness or numbness in one of your legs or feet.  You have trouble controlling your bladder or bowels.  You have nausea, vomiting, abdominal pain, shortness of breath, or fainting. Document Released: 05/15/2004 Document Revised: 06/30/2011 Document Reviewed: 03/22/2011 [facility] Patient Information 2014 [place], [place].    Back Exercises Back exercises help treat and prevent back injuries. The goal of back exercises is to increase the strength of your abdominal and back muscles and the flexibility of your back. These exercises should be started when you no longer have back pain. Back exercises include:  Pelvic Tilt. Lie on your back with your knees bent. Tilt your pelvis until the lower part of your back is against the floor. Hold this position 5 to 10 sec and repeat 5 to 10  times.  Knee to Chest. Pull first 1 knee up against your chest and hold for 20 to 30 seconds, repeat this with the other knee, and then both knees. This may be done with the other leg straight or bent, whichever feels better.  Sit-Ups or Curl-Ups. Bend your knees 90 degrees. Start with tilting your pelvis, and do a partial, slow sit-up, lifting your trunk only 30 to 45 degrees off the floor. Take at least 2 to 3 seconds for each sit-up. Do not do sit-ups with your knees out straight. If partial sit-ups are difficult, simply do the above but with only tightening your abdominal muscles and holding it as directed.  Hip-Lift. Lie on your back with your knees flexed 90 degrees. Push down with your feet and shoulders as you raise your hips a couple inches off the floor; hold for 10 seconds, repeat 5 to 10 times.  Back arches. Lie on your stomach, propping yourself up on bent elbows. Slowly press on your hands, causing an arch in your low back. Repeat 3 to 5 times. Any initial stiffness and discomfort should lessen with repetition over time.  Shoulder-Lifts. Lie face down with arms beside your body. Keep hips and torso pressed to floor as you slowly lift your head and shoulders off the floor. Do not overdo your exercises, especially in the beginning. Exercises may cause you some mild back discomfort which lasts for a few minutes; however, if the pain is more severe, or lasts for more than 15 minutes, do not continue exercises until you see your caregiver. Improvement with exercise therapy for back problems is slow.  See your caregivers  for assistance with developing a proper back exercise program. Document Released: 05/15/2004 Document Revised: 06/30/2011 Document Reviewed: 02/06/2011 [facility] Patient Information 2014 [place].

## 2013-07-14 NOTE — ED Notes (Signed)
Pt c/o generalized back pain d/t several orthopedic issues. Pt states he was started on new narcotic yesterday without relief.

## 2013-07-14 NOTE — ED Provider Notes (Signed)
CSN: [id number]     Arrival date & time 07/14/13  0139 History   First MD Initiated Contact with Patient 07/14/13 [phone]     Chief Complaint  Patient presents with  . Back Pain   HPI  History provided by the patient. Patient is a 48 year old male with history of chronic back pain, hypertension and depression who presents with complaints of unrelieved low back pain. Patient states that he has chronic pains in his lower back. He has received some recent cortisone shots but states she continues to use chronic pain medication to help with his symptoms. He was recently started on a new long-lasting hydrocodone medication which generally has been helping throughout the day. The patient took this around the normal time yesterday at 2 PM for states it wore off much sooner than normal in the evening. He complains of pain to the lower back area without any significant radiation. He also states that he feels some of his osteoarthritis in his right lower leg he is contributing to worsened pain in his back because it causes him to limp slightly. Patient is being followed with orthopedic specialist and states they are planning to do an MRI of his back and have talked about possible additional disc fusions. He denies any urinary or fecal incontinence, urinary retention or perineal numbness. No radiating pain into the extremities. Denies any dysuria, hematuria urinary frequency. No fever, chills or sweats. No other aggravating or alleviating factors. No other associated symptoms.    Past Medical History  Diagnosis Date  . Anxiety   . Hypertension   . Depression     bipolar [facility]  . Hypogonadism male   . ADD (attention deficit disorder)   . Microscopic hematuria     hereditary s/p Urology eval  . Bipolar 1 disorder   . Arthritis     right hip   Past Surgical History  Procedure Laterality Date  . Lumbar disc surgery    . Back surgery     Family History  Problem Relation Age of Onset  .  Diabetes Father   . Cancer Mother     died of melanoma with mets  . Cervical cancer Sister   . Diabetes Sister    History  Substance Use Topics  . Smoking status: Current Every Day Smoker -- 0.50 packs/day for 25 years    Types: Cigarettes  . Smokeless tobacco: Never Used  . Alcohol Use: No    Review of Systems  Constitutional: Negative for fever, chills and diaphoresis.  Respiratory: Negative for shortness of breath.   Cardiovascular: Negative for chest pain.  Genitourinary: Negative for dysuria, frequency, hematuria and flank pain.  Musculoskeletal: Positive for back pain.  Neurological: Negative for weakness and numbness.  All other systems reviewed and are negative.      Allergies  Review of patient's allergies indicates no known allergies.  Home Medications   Current Outpatient Rx  Name  Route  Sig  Dispense  Refill  . buPROPion (WELLBUTRIN SR) 150 MG 12 hr tablet   Oral   Take 150 mg by mouth 3 (three) times daily.          . cholecalciferol (VITAMIN D) 1000 UNITS tablet   Oral   Take 2,000 Units by mouth every morning.          . clonazepam (KLONOPIN) 2 MG disintegrating tablet   Oral   Take 1 tablet (2 mg total) by mouth 3 (three) times daily as needed.  90 tablet   2   . cyanocobalamin 500 MCG tablet   Oral   Take 500 mcg by mouth every morning.          . divalproex (DEPAKOTE ER) 500 MG 24 hr tablet   Oral   Take 1,000 mg by mouth at bedtime.         Donnie Aho (GLUCOSAMINE MSM COMPLEX PO)   Oral   Take 2 capsules by mouth 2 (two) times daily.          Marland Kitchen HYDROcodone Bitartrate ER (HYSINGLA ER) 30 MG T24A   Oral   Take 1 tablet by mouth 1 day or 1 dose. Please fill on or after 08/08/13   30 each   0   . lisdexamfetamine (VYVANSE) 20 MG capsule   Oral   Take 20 mg by mouth every morning.         . Multiple Vitamin (MULTIVITAMIN WITH MINERALS) TABS   Oral   Take 1 tablet by mouth every morning.           Marland Kitchen QUEtiapine (SEROQUEL XR) 200 MG 24 hr tablet   Oral   Take 600 mg by mouth at bedtime.         . tadalafil (CIALIS) 5 MG tablet   Oral   Take 5 mg by mouth daily.         . tamsulosin (FLOMAX) 0.4 MG CAPS capsule   Oral   Take 0.4 mg by mouth 2 (two) times daily.         . Testosterone (ANDROGEL) 20.25 MG/1.25GM (1.62%) GEL   Transdermal   Place 3 application onto the skin daily.   215 g   1     patient number [id number]    BP 133/75  Pulse 88  Temp(Src) 98.2 F (36.8 C) (Oral)  Resp 18  Ht 6\' 5"  (1.956 m)  Wt 230 lb (104.327 kg)  BMI 27.27 kg/m2  SpO2 95% Physical Exam  Nursing note and vitals reviewed. Constitutional: He is oriented to person, place, and time. He appears well-developed and well-nourished. No distress.  HENT:  Head: Normocephalic.  Cardiovascular: Normal rate and regular rhythm.   Pulmonary/Chest: Effort normal and breath sounds normal. No respiratory distress. He has no wheezes. He has no rales.  Abdominal: Soft. There is no tenderness.  No CVA tenderness  Musculoskeletal: Normal range of motion.       Lumbar back: He exhibits tenderness. He exhibits no bony tenderness and no deformity.       Back:  Neurological: He is alert and oriented to person, place, and time. He has normal strength. Gait normal.  Skin: Skin is warm.  Psychiatric: He has a normal mood and affect.    ED Course  Procedures   DIAGNOSTIC STUDIES: Oxygen Saturation is 95% on room air.    COORDINATION OF CARE:  Nursing notes reviewed. Vital signs reviewed. Initial pt interview and examination performed.   4:37 AM-patient seen and evaluated. He does not appear in severe pain or discomfort. No acute distress. No significant changes in his chronic pain. Patient is driving home I discussed treatment options in emergency room at this time we'll give IM shot of Toradol. Patient instructed to continue his home pain medication and followup with his doctors. He  agrees.   Treatment plan initiated: Medications  ketorolac (TORADOL) 30 MG/ML injection 30 mg (not administered)      MDM   Final diagnoses:  Chronic back pain  Martie Lee, PA-C 07/14/13 0500

## 2013-07-14 NOTE — ED Provider Notes (Signed)
Medical screening examination/treatment/procedure(s) were performed by non-physician practitioner and as supervising physician I was immediately available for consultation/collaboration.   EKG Interpretation None        Blanchie Dessert, MD 07/14/13 2032

## 2013-07-18 ENCOUNTER — Other Ambulatory Visit: Payer: Self-pay | Admitting: Orthopedic Surgery

## 2013-07-18 DIAGNOSIS — M545 Low back pain, unspecified: Secondary | ICD-10-CM

## 2013-07-25 ENCOUNTER — Ambulatory Visit
Admission: RE | Admit: 2013-07-25 | Discharge: 2013-07-25 | Disposition: A | Discharge status: Home / Self Care | Payer: BC Managed Care – PPO | Source: Ambulatory Visit | Attending: Orthopedic Surgery | Admitting: Orthopedic Surgery

## 2013-07-25 DIAGNOSIS — M545 Low back pain, unspecified: Secondary | ICD-10-CM

## 2013-07-25 MED ORDER — GADOBENATE DIMEGLUMINE 529 MG/ML IV SOLN
19.0000 mL | Freq: Once | INTRAVENOUS | Status: AC | PRN
  Administered 2013-07-25: 19 mL via INTRAVENOUS

## 2013-08-01 ENCOUNTER — Other Ambulatory Visit: Payer: Self-pay | Admitting: Orthopaedic Surgery

## 2013-08-07 ENCOUNTER — Encounter: Payer: Self-pay | Admitting: Internal Medicine

## 2013-08-09 ENCOUNTER — Encounter (HOSPITAL_COMMUNITY): Payer: Self-pay

## 2013-08-09 ENCOUNTER — Other Ambulatory Visit: Payer: Self-pay | Admitting: Internal Medicine

## 2013-08-09 ENCOUNTER — Ambulatory Visit (HOSPITAL_COMMUNITY)
Admission: RE | Admit: 2013-08-09 | Discharge: 2013-08-09 | Disposition: A | Discharge status: Home / Self Care | Payer: BC Managed Care – PPO | Source: Ambulatory Visit | Attending: Orthopaedic Surgery | Admitting: Orthopaedic Surgery

## 2013-08-09 ENCOUNTER — Encounter (HOSPITAL_COMMUNITY)
Admission: RE | Admit: 2013-08-09 | Discharge: 2013-08-09 | Disposition: A | Discharge status: Home / Self Care | Payer: BC Managed Care – PPO | Source: Ambulatory Visit | Attending: Orthopaedic Surgery | Admitting: Orthopaedic Surgery

## 2013-08-09 ENCOUNTER — Encounter (HOSPITAL_COMMUNITY): Payer: Self-pay | Admitting: Pharmacy Technician

## 2013-08-09 DIAGNOSIS — Z01812 Encounter for preprocedural laboratory examination: Secondary | ICD-10-CM | POA: Insufficient documentation

## 2013-08-09 DIAGNOSIS — Z01818 Encounter for other preprocedural examination: Secondary | ICD-10-CM | POA: Insufficient documentation

## 2013-08-09 HISTORY — DX: Pneumonia, unspecified organism: J18.9

## 2013-08-09 HISTORY — DX: Family history of other specified conditions: Z84.89

## 2013-08-09 HISTORY — DX: Hematuria, unspecified: R31.9

## 2013-08-09 LAB — TYPE AND SCREEN
ABO/RH(D): A POS
Antibody Screen: NEGATIVE

## 2013-08-09 LAB — CBC WITH DIFFERENTIAL/PLATELET
BASOS PCT: 0 % (ref 0–1)
Basophils Absolute: 0 10*3/uL (ref 0.0–0.1)
EOS PCT: 2 % (ref 0–5)
Eosinophils Absolute: 0.1 10*3/uL (ref 0.0–0.7)
HEMATOCRIT: 35.6 % — AB (ref 39.0–52.0)
HEMOGLOBIN: 12.7 g/dL — AB (ref 13.0–17.0)
Lymphocytes Relative: 41 % (ref 12–46)
Lymphs Abs: 2 10*3/uL (ref 0.7–4.0)
MCH: 31.4 pg (ref 26.0–34.0)
MCHC: 35.7 g/dL (ref 30.0–36.0)
MCV: 88.1 fL (ref 78.0–100.0)
MONO ABS: 0.4 10*3/uL (ref 0.1–1.0)
MONOS PCT: 7 % (ref 3–12)
Neutro Abs: 2.4 10*3/uL (ref 1.7–7.7)
Neutrophils Relative %: 50 % (ref 43–77)
Platelets: 129 10*3/uL — ABNORMAL LOW (ref 150–400)
RBC: 4.04 MIL/uL — ABNORMAL LOW (ref 4.22–5.81)
RDW: 13.4 % (ref 11.5–15.5)
WBC: 4.9 10*3/uL (ref 4.0–10.5)

## 2013-08-09 LAB — APTT: aPTT: 27 seconds (ref 24–37)

## 2013-08-09 LAB — BASIC METABOLIC PANEL
BUN: 22 mg/dL (ref 6–23)
CHLORIDE: 104 meq/L (ref 96–112)
CO2: 24 meq/L (ref 19–32)
Calcium: 10.3 mg/dL (ref 8.4–10.5)
Creatinine, Ser: 1.2 mg/dL (ref 0.50–1.35)
GFR calc Af Amer: 83 mL/min — ABNORMAL LOW (ref >90)
GFR calc non Af Amer: 72 mL/min — ABNORMAL LOW (ref >90)
Glucose, Bld: 153 mg/dL — ABNORMAL HIGH (ref 70–99)
Potassium: 4.9 mEq/L (ref 3.7–5.3)
Sodium: 139 mEq/L (ref 137–147)

## 2013-08-09 LAB — SURGICAL PCR SCREEN
MRSA, PCR: NEGATIVE
Staphylococcus aureus: NEGATIVE

## 2013-08-09 LAB — ABO/RH: ABO/RH(D): A POS

## 2013-08-09 LAB — PROTIME-INR
INR: 0.95 (ref 0.00–1.49)
Prothrombin Time: 12.5 seconds (ref 11.6–15.2)

## 2013-08-09 MED ORDER — HYDROCODONE BITARTRATE ER 40 MG PO T24A: 40.0000 mg | EXTENDED_RELEASE_TABLET | Freq: Every day | ORAL | Status: DC

## 2013-08-09 NOTE — Progress Notes (Signed)
PCP is Dr Lewis Moccasin Denies seeing a cardiologist. Denies having a stress test, echo, or card cath. Pt states that he just went to the restroom, and will not be able to get a urine speci at this time.

## 2013-08-09 NOTE — Pre-Procedure Instructions (Signed)
JAMARIYON TORTORIELLO  08/09/2013  Your procedure is scheduled on:  April 28th 1020  Report to [place]  2 * 3 at [place] AM.  Call this number if you have problems the morning of surgery: [phone]   Remember:   Do not eat food or drink liquids after midnight.   Take these medicines the morning of surgery with A SIP OF WATER: bupropion (Wellbutrin SR), Tamsulosin (flomax), Hydrocodone Bitartrate ER (Hysingla ER),  lisdexamfetamine (Vyvanse), Clonazepam (Klonopin) if needed   Stop taking Aspirin, Aleve,Ibuprofen, BC's Goody's, Herbal medications, Fish Oil   Do not wear jewelry, make-up or nail polish.  Do not wear lotions, powders, or perfumes. You may wear deodorant.  Do not shave 48 hours prior to surgery. Men may shave face and neck.  Do not bring valuables to the hospital.  [facility] is not responsible for any belongings or valuables.               Contacts, dentures or bridgework may not be worn into surgery.  Leave suitcase in the car. After surgery it may be brought to your room.  For patients admitted to the hospital, discharge time is determined by your treatment team.               Patients discharged the day of surgery will not be allowed to drive home.    Special Instructions:[place] - Preparing for Surgery  Before surgery, you can play an important role.  Because skin is not sterile, your skin needs to be as free of germs as possible.  You can reduce the number of germs on you skin by washing with CHG (chlorahexidine gluconate) soap before surgery.  CHG is an antiseptic cleaner which kills germs and bonds with the skin to continue killing germs even after washing.  Please DO NOT use if you have an allergy to CHG or antibacterial soaps.  If your skin becomes reddened/irritated stop using the CHG and inform your nurse when you arrive at Short Stay.  Do not shave (including legs and underarms) for at least 48 hours prior to the first CHG shower.  You  may shave your face.  Please follow these instructions carefully:   1.  Shower with CHG Soap the night before surgery and the  morning of Surgery.  2.  If you choose to wash your hair, wash your hair first as usual with your  normal shampoo.  3.  After you shampoo, rinse your hair and body thoroughly to remove the  Shampoo.  4.  Use CHG as you would any other liquid soap.  You can apply chg directly to the skin and wash gently with scrungie or a clean washcloth.  5.  Apply the CHG Soap to your body ONLY FROM THE NECK DOWN.   Do not use on open wounds or open sores.  Avoid contact with your eyes,       ears, mouth and genitals (private parts).  Wash genitals (private parts)   with your normal soap.  6.  Wash thoroughly, paying special attention to the area where your surgery will be performed.  7.  Thoroughly rinse your body with warm water from the neck down.  8.  DO NOT shower/wash with your normal soap after using and rinsing off  the CHG Soap.  9.  Pat yourself dry with a clean towel.            10.  Wear clean pajamas.  11.  Place clean sheets on your bed the night of your first shower and do not  sleep with pets.  Day of Surgery  Do not apply any lotions/deoderants the morning of surgery.  Please wear clean clothes to the hospital/surgery center.     Please read over the following fact sheets that you were given: Pain Booklet, Coughing and Deep Breathing, Blood Transfusion Information, MRSA Information and Surgical Site Infection Prevention

## 2013-08-09 NOTE — H&P (Signed)
TOTAL HIP ADMISSION H&P  Patient is admitted for right total hip arthroplasty.  Subjective:  Chief Complaint: right hip pain  HPI: [name], 48 y.o. male, has a history of pain and functional disability in the right hip(s) due to arthritis and patient has failed non-surgical conservative treatments for greater than 12 weeks to include NSAID's and/or analgesics, flexibility and strengthening excercises, supervised PT with diminished ADL's post treatment, use of assistive devices and activity modification.  Onset of symptoms was gradual starting 3 years ago with gradually worsening course since that time.The patient noted no past surgery on the right hip(s).  Patient currently rates pain in the right hip at 10 out of 10 with activity. Patient has night pain, worsening of pain with activity and weight bearing, trendelenberg gait, pain that interfers with activities of daily living and pain with passive range of motion. Patient has evidence of subchondral sclerosis, periarticular osteophytes and joint space narrowing by imaging studies. This condition presents safety issues increasing the risk of falls. This patient has had avascular necrosis of the hip, acetabular fracture, hip dysplasia.  There is no current active infection.  Patient Active Problem List   Diagnosis Date Noted  . Acute respiratory failure 11/02/2012  . Pneumonia, organism unspecified 11/02/2012  . Pleural effusion 11/02/2012  . Hypoxemia 11/02/2012  . HCAP (healthcare-associated pneumonia) 10/31/2012  . Anemia 10/31/2012  . Osteoarthritis of right hip 11/28/2011  . Right hip pain 07/22/2011  . Bilateral hip pain 07/22/2011  . LBP (low back pain) 05/20/2011  . Testicular pain, right 05/20/2011  . Erectile dysfunction 02/17/2011  . Constipation - functional 02/17/2011  . Diabetes type 2, controlled 11/06/2010  . HYPOGONADISM 05/20/2010  . TOBACCO USER 05/20/2010  . Demoralization and apathy 05/20/2010  . BIPOLAR  DISORDER UNSPECIFIED 01/18/2010  . DEPRESSION 01/18/2010  . MICROSCOPIC HEMATURIA 01/18/2010  . SHOULDER PAIN 01/18/2010  . ANXIETY 12/05/2006  . HYPERTENSION 12/05/2006   Past Medical History  Diagnosis Date  . Anxiety   . Depression     bipolar [facility]  . Hypogonadism male   . ADD (attention deficit disorder)   . Microscopic hematuria     hereditary s/p Urology eval  . Bipolar 1 disorder   . Arthritis     right hip  . Family history of anesthesia complication     pt is unsure , but pt father may have been difficult to arouse   . Pneumonia 10-2012  . Kidney stones   . Blood in urine     Past Surgical History  Procedure Laterality Date  . Lumbar disc surgery    . Back surgery    . Closed reduction metacarpal with percutaneous pinning Right   . Tonsillectomy      No prescriptions prior to admission   No Known Allergies  History  Substance Use Topics  . Smoking status: Former Smoker -- 25 years    Types: Cigarettes  . Smokeless tobacco: Never Used  . Alcohol Use: No    Family History  Problem Relation Age of Onset  . Diabetes Father   . Cancer Mother     died of melanoma with mets  . Cervical cancer Sister   . Diabetes Sister      Review of Systems  Constitutional: Negative.   HENT: Negative.   Eyes: Negative.   Respiratory: Negative.   Cardiovascular: Negative.   Gastrointestinal: Negative.   Genitourinary: Negative.   Musculoskeletal: Positive for joint pain.  Skin: Negative.   Neurological:  Negative.   Endo/Heme/Allergies: Negative.   Psychiatric/Behavioral: Negative.     Objective:  Physical Exam  Constitutional: He appears well-developed.  HENT:  Head: Normocephalic.  Eyes: Pupils are equal, round, and reactive to light.  Neck: Normal range of motion.  Cardiovascular: Normal rate.   Respiratory: Effort normal.  GI: Soft.  Musculoskeletal:  Right hip exam: Very painful rotation and rotation very limited.  Leg lengths equal.  Good  neurovascular status otherwise.  Negative Homans.  Neurological: He is alert.  Skin: Skin is warm.  Psychiatric: He has a normal mood and affect.    Vital signs in last 24 hours: Temp:  [97.9 F (36.6 C)] 97.9 F (36.6 C) (04/21 1207) Pulse Rate:  [93] 93 (04/21 1207) Resp:  [20] 20 (04/21 1207) BP: (144)/(84) 144/84 mmHg (04/21 1207) SpO2:  [95 %] 95 % (04/21 1207) Weight:  [96.934 kg (213 lb 11.2 oz)] 96.934 kg (213 lb 11.2 oz) (04/21 1207)  Labs:   Estimated body mass index is 27.27 kg/(m^2) as calculated from the following:   Height as of 07/14/13: 6\' 5"  (1.956 m).   Weight as of 07/14/13: 104.327 kg (230 lb).   Imaging Review Plain radiographs demonstrate severe degenerative joint disease of the right hip(s). The bone quality appears to be good for age and reported activity level.  Assessment/Plan:  End stage arthritis, right hip(s)  The patient history, physical examination, clinical judgement of the provider and imaging studies are consistent with end stage degenerative joint disease of the right hip(s) and total hip arthroplasty is deemed medically necessary. The treatment options including medical management, injection therapy, arthroscopy and arthroplasty were discussed at length. The risks and benefits of total hip arthroplasty were presented and reviewed. The risks due to aseptic loosening, infection, stiffness, dislocation/subluxation,  thromboembolic complications and other imponderables were discussed.  The patient acknowledged the explanation, agreed to proceed with the plan and consent was signed. Patient is being admitted for inpatient treatment for surgery, pain control, PT, OT, prophylactic antibiotics, VTE prophylaxis, progressive ambulation and ADL's and discharge planning.The patient is planning to be discharged home with home health services

## 2013-08-10 LAB — URINALYSIS, ROUTINE W REFLEX MICROSCOPIC

## 2013-08-15 MED ORDER — CEFAZOLIN SODIUM-DEXTROSE 2-3 GM-% IV SOLR
2.0000 g | INTRAVENOUS | Status: AC
  Administered 2013-08-16: 2 g via INTRAVENOUS
  Filled 2013-08-15: qty 50

## 2013-08-16 ENCOUNTER — Encounter (HOSPITAL_COMMUNITY): Payer: BC Managed Care – PPO | Admitting: Anesthesiology

## 2013-08-16 ENCOUNTER — Ambulatory Visit (HOSPITAL_COMMUNITY): Payer: BC Managed Care – PPO

## 2013-08-16 ENCOUNTER — Encounter (HOSPITAL_COMMUNITY)
Admission: RE | Disposition: A | Discharge status: Home Health | Payer: Self-pay | Source: Ambulatory Visit | Attending: Orthopaedic Surgery

## 2013-08-16 ENCOUNTER — Ambulatory Visit (HOSPITAL_COMMUNITY): Payer: BC Managed Care – PPO | Admitting: Anesthesiology

## 2013-08-16 ENCOUNTER — Encounter (HOSPITAL_COMMUNITY): Payer: Self-pay | Admitting: *Deleted

## 2013-08-16 ENCOUNTER — Inpatient Hospital Stay (HOSPITAL_COMMUNITY)
Admission: RE | Admit: 2013-08-16 | Discharge: 2013-08-18 | DRG: 470 | Disposition: A | Discharge status: Home Health | Payer: BC Managed Care – PPO | Source: Ambulatory Visit | Attending: Orthopaedic Surgery | Admitting: Orthopaedic Surgery

## 2013-08-16 DIAGNOSIS — Z833 Family history of diabetes mellitus: Secondary | ICD-10-CM

## 2013-08-16 DIAGNOSIS — E291 Testicular hypofunction: Secondary | ICD-10-CM | POA: Diagnosis present

## 2013-08-16 DIAGNOSIS — F319 Bipolar disorder, unspecified: Secondary | ICD-10-CM | POA: Diagnosis present

## 2013-08-16 DIAGNOSIS — M169 Osteoarthritis of hip, unspecified: Principal | ICD-10-CM | POA: Diagnosis present

## 2013-08-16 DIAGNOSIS — Z87891 Personal history of nicotine dependence: Secondary | ICD-10-CM

## 2013-08-16 DIAGNOSIS — Z8049 Family history of malignant neoplasm of other genital organs: Secondary | ICD-10-CM

## 2013-08-16 DIAGNOSIS — I10 Essential (primary) hypertension: Secondary | ICD-10-CM | POA: Diagnosis present

## 2013-08-16 DIAGNOSIS — F411 Generalized anxiety disorder: Secondary | ICD-10-CM | POA: Diagnosis present

## 2013-08-16 DIAGNOSIS — F988 Other specified behavioral and emotional disorders with onset usually occurring in childhood and adolescence: Secondary | ICD-10-CM | POA: Diagnosis present

## 2013-08-16 DIAGNOSIS — E119 Type 2 diabetes mellitus without complications: Secondary | ICD-10-CM | POA: Diagnosis present

## 2013-08-16 DIAGNOSIS — M1610 Unilateral primary osteoarthritis, unspecified hip: Principal | ICD-10-CM | POA: Diagnosis present

## 2013-08-16 HISTORY — PX: TOTAL HIP ARTHROPLASTY: SHX124

## 2013-08-16 LAB — URINALYSIS, ROUTINE W REFLEX MICROSCOPIC
Bilirubin Urine: NEGATIVE
GLUCOSE, UA: NEGATIVE mg/dL
Ketones, ur: NEGATIVE mg/dL
Leukocytes, UA: NEGATIVE
Nitrite: NEGATIVE
PH: 5 (ref 5.0–8.0)
Protein, ur: NEGATIVE mg/dL
Specific Gravity, Urine: 1.021 (ref 1.005–1.030)
Urobilinogen, UA: 0.2 mg/dL (ref 0.0–1.0)

## 2013-08-16 LAB — URINE MICROSCOPIC-ADD ON

## 2013-08-16 SURGERY — ARTHROPLASTY, HIP, TOTAL, ANTERIOR APPROACH
Anesthesia: General | Site: Hip | Laterality: Right

## 2013-08-16 MED ORDER — NEOSTIGMINE METHYLSULFATE 1 MG/ML IJ SOLN
INTRAMUSCULAR | Status: DC | PRN
  Administered 2013-08-16: 4 mg via INTRAVENOUS

## 2013-08-16 MED ORDER — FENTANYL CITRATE 0.05 MG/ML IJ SOLN
INTRAMUSCULAR | Status: DC | PRN
  Administered 2013-08-16: 50 ug via INTRAVENOUS
  Administered 2013-08-16: 100 ug via INTRAVENOUS

## 2013-08-16 MED ORDER — ROCURONIUM BROMIDE 50 MG/5ML IV SOLN
INTRAVENOUS | Status: AC
  Filled 2013-08-16: qty 1

## 2013-08-16 MED ORDER — CHLORHEXIDINE GLUCONATE 4 % EX LIQD
60.0000 mL | Freq: Once | CUTANEOUS | Status: DC
  Filled 2013-08-16: qty 60

## 2013-08-16 MED ORDER — MIDAZOLAM HCL 5 MG/5ML IJ SOLN
INTRAMUSCULAR | Status: DC | PRN
  Administered 2013-08-16: 2 mg via INTRAVENOUS

## 2013-08-16 MED ORDER — MENTHOL 3 MG MT LOZG: 1.0000 | LOZENGE | OROMUCOSAL | Status: DC | PRN

## 2013-08-16 MED ORDER — CLONAZEPAM 1 MG PO TBDP: 2.0000 mg | ORAL_TABLET | Freq: Three times a day (TID) | ORAL | Status: DC | PRN

## 2013-08-16 MED ORDER — ONDANSETRON HCL 4 MG/2ML IJ SOLN
INTRAMUSCULAR | Status: DC | PRN
  Administered 2013-08-16: 4 mg via INTRAVENOUS

## 2013-08-16 MED ORDER — LACTATED RINGERS IV SOLN: INTRAVENOUS | Status: DC

## 2013-08-16 MED ORDER — MIDAZOLAM HCL 2 MG/2ML IJ SOLN
INTRAMUSCULAR | Status: AC
  Filled 2013-08-16: qty 2

## 2013-08-16 MED ORDER — PHENYLEPHRINE 40 MCG/ML (10ML) SYRINGE FOR IV PUSH (FOR BLOOD PRESSURE SUPPORT)
PREFILLED_SYRINGE | INTRAVENOUS | Status: AC
  Filled 2013-08-16: qty 10

## 2013-08-16 MED ORDER — 0.9 % SODIUM CHLORIDE (POUR BTL) OPTIME
TOPICAL | Status: DC | PRN
  Administered 2013-08-16: 1000 mL

## 2013-08-16 MED ORDER — VITAMIN D3 25 MCG (1000 UNIT) PO TABS
1000.0000 [IU] | ORAL_TABLET | Freq: Every morning | ORAL | Status: DC
  Administered 2013-08-16 – 2013-08-18 (×3): 1000 [IU] via ORAL
  Filled 2013-08-16 (×3): qty 1

## 2013-08-16 MED ORDER — DEXAMETHASONE SODIUM PHOSPHATE 4 MG/ML IJ SOLN
INTRAMUSCULAR | Status: AC
  Filled 2013-08-16: qty 3

## 2013-08-16 MED ORDER — METHOCARBAMOL 500 MG PO TABS
ORAL_TABLET | ORAL | Status: AC
  Administered 2013-08-16: 14:00:00
  Filled 2013-08-16: qty 1

## 2013-08-16 MED ORDER — PHENYLEPHRINE HCL 10 MG/ML IJ SOLN
INTRAMUSCULAR | Status: DC | PRN
  Administered 2013-08-16 (×2): 80 ug via INTRAVENOUS
  Administered 2013-08-16: 120 ug via INTRAVENOUS
  Administered 2013-08-16: 80 ug via INTRAVENOUS

## 2013-08-16 MED ORDER — CYANOCOBALAMIN 500 MCG PO TABS
500.0000 ug | ORAL_TABLET | Freq: Every morning | ORAL | Status: DC
  Administered 2013-08-16 – 2013-08-18 (×3): 500 ug via ORAL
  Filled 2013-08-16 (×3): qty 1

## 2013-08-16 MED ORDER — METOCLOPRAMIDE HCL 5 MG/ML IJ SOLN: 5.0000 mg | Freq: Three times a day (TID) | INTRAMUSCULAR | Status: DC | PRN

## 2013-08-16 MED ORDER — PROPOFOL 10 MG/ML IV BOLUS
INTRAVENOUS | Status: DC | PRN
  Administered 2013-08-16: 200 mg via INTRAVENOUS

## 2013-08-16 MED ORDER — FERROUS SULFATE 325 (65 FE) MG PO TABS
325.0000 mg | ORAL_TABLET | Freq: Two times a day (BID) | ORAL | Status: DC
  Administered 2013-08-16 – 2013-08-18 (×4): 325 mg via ORAL
  Filled 2013-08-16 (×6): qty 1

## 2013-08-16 MED ORDER — METHOCARBAMOL 100 MG/ML IJ SOLN
500.0000 mg | Freq: Four times a day (QID) | INTRAVENOUS | Status: DC | PRN
  Filled 2013-08-16: qty 5

## 2013-08-16 MED ORDER — CEFAZOLIN SODIUM-DEXTROSE 2-3 GM-% IV SOLR
2.0000 g | Freq: Four times a day (QID) | INTRAVENOUS | Status: AC
  Administered 2013-08-16 (×2): 2 g via INTRAVENOUS
  Filled 2013-08-16 (×2): qty 50

## 2013-08-16 MED ORDER — ACETAMINOPHEN 325 MG PO TABS: 650.0000 mg | ORAL_TABLET | Freq: Four times a day (QID) | ORAL | Status: DC | PRN

## 2013-08-16 MED ORDER — HYDROCODONE BITARTRATE ER 40 MG PO T24A: 40.0000 mg | EXTENDED_RELEASE_TABLET | Freq: Every day | ORAL | Status: DC

## 2013-08-16 MED ORDER — ONDANSETRON HCL 4 MG/2ML IJ SOLN
INTRAMUSCULAR | Status: AC
  Filled 2013-08-16: qty 2

## 2013-08-16 MED ORDER — ACETAMINOPHEN 650 MG RE SUPP: 650.0000 mg | Freq: Four times a day (QID) | RECTAL | Status: DC | PRN

## 2013-08-16 MED ORDER — PHENOL 1.4 % MT LIQD
1.0000 | OROMUCOSAL | Status: DC | PRN
Start: 2013-08-16 — End: 2013-08-18

## 2013-08-16 MED ORDER — ASPIRIN EC 325 MG PO TBEC
325.0000 mg | DELAYED_RELEASE_TABLET | Freq: Two times a day (BID) | ORAL | Status: DC
  Administered 2013-08-17 – 2013-08-18 (×3): 325 mg via ORAL
  Filled 2013-08-16 (×5): qty 1

## 2013-08-16 MED ORDER — TRANEXAMIC ACID 100 MG/ML IV SOLN
1000.0000 mg | INTRAVENOUS | Status: AC
  Administered 2013-08-16: 1000 mg via INTRAVENOUS
  Filled 2013-08-16: qty 10

## 2013-08-16 MED ORDER — GLYCOPYRROLATE 0.2 MG/ML IJ SOLN
INTRAMUSCULAR | Status: DC | PRN
  Administered 2013-08-16: 0.6 mg via INTRAVENOUS

## 2013-08-16 MED ORDER — HYDROCODONE-ACETAMINOPHEN 5-325 MG PO TABS
1.0000 | ORAL_TABLET | ORAL | Status: DC | PRN
  Administered 2013-08-16 – 2013-08-17 (×3): 2 via ORAL
  Filled 2013-08-16 (×3): qty 2

## 2013-08-16 MED ORDER — ONDANSETRON HCL 4 MG/2ML IJ SOLN: 4.0000 mg | Freq: Four times a day (QID) | INTRAMUSCULAR | Status: DC | PRN

## 2013-08-16 MED ORDER — HYDROMORPHONE HCL PF 1 MG/ML IJ SOLN
INTRAMUSCULAR | Status: AC
  Administered 2013-08-16: 14:00:00
  Filled 2013-08-16: qty 1

## 2013-08-16 MED ORDER — DIVALPROEX SODIUM ER 500 MG PO TB24
1000.0000 mg | ORAL_TABLET | Freq: Every day | ORAL | Status: DC
  Administered 2013-08-16 – 2013-08-17 (×2): 1000 mg via ORAL
  Filled 2013-08-16 (×3): qty 2

## 2013-08-16 MED ORDER — ADULT MULTIVITAMIN W/MINERALS CH
1.0000 | ORAL_TABLET | Freq: Every morning | ORAL | Status: DC
  Administered 2013-08-16 – 2013-08-18 (×3): 1 via ORAL
  Filled 2013-08-16 (×3): qty 1

## 2013-08-16 MED ORDER — HYDROMORPHONE HCL PF 1 MG/ML IJ SOLN
0.2500 mg | INTRAMUSCULAR | Status: DC | PRN
  Administered 2013-08-16: 0.5 mg via INTRAVENOUS
  Administered 2013-08-16: 0.25 mg via INTRAVENOUS

## 2013-08-16 MED ORDER — FENTANYL CITRATE 0.05 MG/ML IJ SOLN
INTRAMUSCULAR | Status: AC
  Filled 2013-08-16: qty 5

## 2013-08-16 MED ORDER — DEXAMETHASONE SODIUM PHOSPHATE 10 MG/ML IJ SOLN
10.0000 mg | Freq: Once | INTRAMUSCULAR | Status: AC
  Administered 2013-08-16: 10 mg via INTRAVENOUS

## 2013-08-16 MED ORDER — ONDANSETRON HCL 4 MG PO TABS: 4.0000 mg | ORAL_TABLET | Freq: Four times a day (QID) | ORAL | Status: DC | PRN

## 2013-08-16 MED ORDER — DIPHENHYDRAMINE HCL 12.5 MG/5ML PO ELIX: 12.5000 mg | ORAL_SOLUTION | ORAL | Status: DC | PRN

## 2013-08-16 MED ORDER — BISACODYL 5 MG PO TBEC: 5.0000 mg | DELAYED_RELEASE_TABLET | Freq: Every day | ORAL | Status: DC | PRN

## 2013-08-16 MED ORDER — METHOCARBAMOL 500 MG PO TABS
500.0000 mg | ORAL_TABLET | Freq: Four times a day (QID) | ORAL | Status: DC | PRN
  Administered 2013-08-16 – 2013-08-17 (×3): 500 mg via ORAL
  Filled 2013-08-16 (×3): qty 1

## 2013-08-16 MED ORDER — METOCLOPRAMIDE HCL 10 MG PO TABS: 5.0000 mg | ORAL_TABLET | Freq: Three times a day (TID) | ORAL | Status: DC | PRN

## 2013-08-16 MED ORDER — PROPOFOL 10 MG/ML IV BOLUS
INTRAVENOUS | Status: AC
  Filled 2013-08-16: qty 20

## 2013-08-16 MED ORDER — ALUM & MAG HYDROXIDE-SIMETH 200-200-20 MG/5ML PO SUSP: 30.0000 mL | ORAL | Status: DC | PRN

## 2013-08-16 MED ORDER — QUETIAPINE FUMARATE ER 300 MG PO TB24
600.0000 mg | ORAL_TABLET | Freq: Every day | ORAL | Status: DC
  Administered 2013-08-16 – 2013-08-18 (×2): 600 mg via ORAL
  Filled 2013-08-16 (×3): qty 2

## 2013-08-16 MED ORDER — TESTOSTERONE 20.25 MG/1.25GM (1.62%) TD GEL: 3.0000 "application " | Freq: Every day | TRANSDERMAL | Status: DC

## 2013-08-16 MED ORDER — LIDOCAINE HCL (CARDIAC) 20 MG/ML IV SOLN
INTRAVENOUS | Status: AC
  Filled 2013-08-16: qty 5

## 2013-08-16 MED ORDER — BUPROPION HCL ER (SR) 150 MG PO TB12
150.0000 mg | ORAL_TABLET | Freq: Three times a day (TID) | ORAL | Status: DC
  Administered 2013-08-16 – 2013-08-18 (×5): 150 mg via ORAL
  Filled 2013-08-16 (×7): qty 1

## 2013-08-16 MED ORDER — TESTOSTERONE 50 MG/5GM (1%) TD GEL: 5.0000 g | Freq: Every day | TRANSDERMAL | Status: DC

## 2013-08-16 MED ORDER — LIDOCAINE HCL (CARDIAC) 20 MG/ML IV SOLN
INTRAVENOUS | Status: DC | PRN
  Administered 2013-08-16: 80 mg via INTRAVENOUS

## 2013-08-16 MED ORDER — HYDROMORPHONE HCL PF 1 MG/ML IJ SOLN
0.5000 mg | INTRAMUSCULAR | Status: DC | PRN
  Administered 2013-08-17 – 2013-08-18 (×5): 1 mg via INTRAVENOUS
  Filled 2013-08-16 (×6): qty 1

## 2013-08-16 MED ORDER — CLONAZEPAM 1 MG PO TABS
2.0000 mg | ORAL_TABLET | Freq: Three times a day (TID) | ORAL | Status: DC | PRN
  Administered 2013-08-18: 2 mg via ORAL
  Filled 2013-08-16: qty 2

## 2013-08-16 MED ORDER — DOCUSATE SODIUM 100 MG PO CAPS
100.0000 mg | ORAL_CAPSULE | Freq: Two times a day (BID) | ORAL | Status: DC
  Administered 2013-08-16 – 2013-08-18 (×5): 100 mg via ORAL
  Filled 2013-08-16 (×7): qty 1

## 2013-08-16 MED ORDER — ROCURONIUM BROMIDE 100 MG/10ML IV SOLN
INTRAVENOUS | Status: DC | PRN
  Administered 2013-08-16: 50 mg via INTRAVENOUS
  Administered 2013-08-16: 20 mg via INTRAVENOUS

## 2013-08-16 MED ORDER — LACTATED RINGERS IV SOLN
INTRAVENOUS | Status: DC
  Administered 2013-08-16: 11:00:00 via INTRAVENOUS
  Administered 2013-08-16: 50 mL/h via INTRAVENOUS

## 2013-08-16 SURGICAL SUPPLY — 54 items
BLADE 10 SAFETY STRL DISP (BLADE) ×3 IMPLANT
BLADE SAW SGTL 18X1.27X75 (BLADE) ×2 IMPLANT
BLADE SAW SGTL 18X1.27X75MM (BLADE) ×1
BLADE SURG ROTATE 9660 (MISCELLANEOUS) ×2 IMPLANT
CAPT HIP PF COP ×2 IMPLANT
CELLS DAT CNTRL 66122 CELL SVR (MISCELLANEOUS) ×1 IMPLANT
COVER SURGICAL LIGHT HANDLE (MISCELLANEOUS) ×3 IMPLANT
DRAPE C-ARM 42X72 X-RAY (DRAPES) ×3 IMPLANT
DRAPE STERI IOBAN 125X83 (DRAPES) ×3 IMPLANT
DRAPE U-SHAPE 47X51 STRL (DRAPES) ×9 IMPLANT
DRSG AQUACEL AG ADV 3.5X10 (GAUZE/BANDAGES/DRESSINGS) ×3 IMPLANT
DURAPREP 26ML APPLICATOR (WOUND CARE) ×3 IMPLANT
ELECT BLADE 4.0 EZ CLEAN MEGAD (MISCELLANEOUS) ×3
ELECT CAUTERY BLADE 6.4 (BLADE) ×3 IMPLANT
ELECT REM PT RETURN 9FT ADLT (ELECTROSURGICAL) ×3
ELECTRODE BLDE 4.0 EZ CLN MEGD (MISCELLANEOUS) IMPLANT
ELECTRODE REM PT RTRN 9FT ADLT (ELECTROSURGICAL) ×1 IMPLANT
FACESHIELD WRAPAROUND (MASK) ×6 IMPLANT
GLOVE BIO SURGEON STRL SZ7 (GLOVE) ×9 IMPLANT
GLOVE BIO SURGEON STRL SZ8 (GLOVE) ×7 IMPLANT
GLOVE BIO SURGEON STRL SZ8.5 (GLOVE) ×1 IMPLANT
GLOVE BIOGEL PI IND STRL 7.5 (GLOVE) ×3 IMPLANT
GLOVE BIOGEL PI IND STRL 8 (GLOVE) ×1 IMPLANT
GLOVE BIOGEL PI IND STRL 8.5 (GLOVE) IMPLANT
GLOVE BIOGEL PI INDICATOR 7.5 (GLOVE) ×6
GLOVE BIOGEL PI INDICATOR 8 (GLOVE) ×6
GLOVE BIOGEL PI INDICATOR 8.5 (GLOVE)
GLOVE SS BIOGEL STRL SZ 8 (GLOVE) ×1 IMPLANT
GLOVE SUPERSENSE BIOGEL SZ 8 (GLOVE) ×8
GLOVE SURG ORTHO 7.0 STRL STRW (GLOVE) ×3 IMPLANT
GLOVE SURG SS PI 8.0 STRL IVOR (GLOVE) ×4 IMPLANT
GOWN STRL REUS W/ TWL LRG LVL3 (GOWN DISPOSABLE) ×2 IMPLANT
GOWN STRL REUS W/ TWL XL LVL3 (GOWN DISPOSABLE) ×1 IMPLANT
GOWN STRL REUS W/TWL LRG LVL3 (GOWN DISPOSABLE)
GOWN STRL REUS W/TWL XL LVL3 (GOWN DISPOSABLE) ×21
KIT BASIN OR (CUSTOM PROCEDURE TRAY) ×3 IMPLANT
KIT ROOM TURNOVER OR (KITS) ×3 IMPLANT
MANIFOLD NEPTUNE II (INSTRUMENTS) ×3 IMPLANT
NS IRRIG 1000ML POUR BTL (IV SOLUTION) ×3 IMPLANT
PACK TOTAL JOINT (CUSTOM PROCEDURE TRAY) ×3 IMPLANT
PAD ARMBOARD 7.5X6 YLW CONV (MISCELLANEOUS) ×6 IMPLANT
RTRCTR WOUND ALEXIS 18CM MED (MISCELLANEOUS) ×3
STAPLER VISISTAT 35W (STAPLE) ×3 IMPLANT
SUT ETHIBOND NAB CT1 #1 30IN (SUTURE) ×6 IMPLANT
SUT VIC AB 0 CT1 27 (SUTURE) ×3
SUT VIC AB 0 CT1 27XBRD ANBCTR (SUTURE) IMPLANT
SUT VIC AB 1 CT1 27 (SUTURE) ×3
SUT VIC AB 1 CT1 27XBRD ANBCTR (SUTURE) ×1 IMPLANT
SUT VIC AB 2-0 CT1 27 (SUTURE) ×3
SUT VIC AB 2-0 CT1 TAPERPNT 27 (SUTURE) ×1 IMPLANT
SUT VLOC 180 0 24IN GS25 (SUTURE) ×3 IMPLANT
TOWEL OR 17X24 6PK STRL BLUE (TOWEL DISPOSABLE) ×3 IMPLANT
TOWEL OR 17X26 10 PK STRL BLUE (TOWEL DISPOSABLE) ×6 IMPLANT
YANKAUER SUCT BULB TIP NO VENT (SUCTIONS) ×2 IMPLANT

## 2013-08-16 NOTE — Evaluation (Signed)
Physical Therapy Evaluation Patient Details Name: [name] MRN: [id number] DOB: [birth year] Today's Date: 08/16/2013   History of Present Illness  s/p R THA anterior approach  Clinical Impression  Pt is s/p THA resulting in the deficits listed below (see PT Problem List). Pt will benefit from skilled PT to increase their independence and safety with mobility to allow discharge to the venue listed below.      Follow Up Recommendations Home health PT;Supervision - Intermittent    Equipment Recommendations  Rolling walker with 5" wheels;3in1 (PT) (Tall)    Recommendations for Other Services OT consult     Precautions / Restrictions Precautions Precautions: None Restrictions Weight Bearing Restrictions: Yes RLE Weight Bearing: Weight bearing as tolerated      Mobility  Bed Mobility Overal bed mobility: Needs Assistance Bed Mobility: Supine to Sit     Supine to sit: Min guard     General bed mobility comments: Cues for technique  Transfers Overall transfer level: Needs assistance Equipment used: Rolling walker (2 wheeled) Transfers: Sit to/from Stand Sit to Stand: Min guard         General transfer comment: Cues for safety, technique, and handplacemnt  Ambulation/Gait Ambulation/Gait assistance: Min guard Ambulation Distance (Feet): 10 Feet Assistive device: Rolling walker (2 wheeled) Gait Pattern/deviations: Step-through pattern;Decreased stride length     General Gait Details: Cues for gait sequence; Noted pt a bit impuslive with stepping, but still able to safely use RW for steadiness; a bit dizzy, so sat down  Stairs            Wheelchair Mobility    Modified Rankin (Stroke Patients Only)       Balance Overall balance assessment: Needs assistance         Standing balance support: Bilateral upper extremity supported Standing balance-Leahy Scale: Poor Standing balance comment: dependent on UE support                              Pertinent Vitals/Pain 6-7/10 R hip pain patient repositioned for comfort     Home Living Family/patient expects to be discharged to:: Private residence Living Arrangements: Other relatives (sister) Available Help at Discharge: Family;Available 24 hours/day Type of Home: House Home Access: Stairs to enter   [employer] of Steps: 1 (1+1) Home Layout: Two level Home Equipment: Cane - single point      Prior Function Level of Independence: Independent;Independent with assistive device(s)         Comments: cane for long distances     Hand Dominance        Extremity/Trunk Assessment   Upper Extremity Assessment: Overall WFL for tasks assessed           Lower Extremity Assessment: RLE deficits/detail RLE Deficits / Details: painful with moving       Communication   Communication: No difficulties  Cognition Arousal/Alertness: Awake/alert Behavior During Therapy: WFL for tasks assessed/performed Overall Cognitive Status: Within Functional Limits for tasks assessed                      General Comments      Exercises        Assessment/Plan    PT Assessment Patient needs continued PT services  PT Diagnosis Difficulty walking;Acute pain   PT Problem List Decreased strength;Decreased range of motion;Decreased activity tolerance;Decreased mobility;Decreased knowledge of use of DME;Pain  PT Treatment Interventions DME instruction;Gait training;Stair training;Functional mobility training;Therapeutic activities;Therapeutic  exercise;Patient/family education   PT Goals (Current goals can be found in the Care Plan section) Acute Rehab PT Goals Patient Stated Goal: to walk without pain PT Goal Formulation: With patient Time For Goal Achievement: 08/23/13 Potential to Achieve Goals: Good    Frequency 7X/week   Barriers to discharge Decreased caregiver support Not sure if pt's sister can give 24 hour assist    Co-evaluation                End of Session   Activity Tolerance: Patient tolerated treatment well Patient left: in chair;with call bell/phone within reach (eating late lunch) Nurse Communication: Mobility status         Time: 1520-1540 PT Time Calculation (min): 20 min   Charges:   PT Evaluation $Initial PT Evaluation Tier I: 1 Procedure PT Treatments $Gait Training: 8-22 mins   PT G Codes:          [facility] [place] 08/16/2013, 7:45 PM Roney Marion, [place] Pager [phone] Office [phone]

## 2013-08-16 NOTE — Op Note (Signed)
PRE-OP DIAGNOSIS:  RIGHT HIP DEGENERATIVE JOINT DISEASE POST-OP DIAGNOSIS:  same PROCEDURE: RIGHT TOTAL HIP ARTHROPLASTY ANTERIOR APPROACH ANESTHESIA:  General SURGEON:  Melrose Nakayama MD ASSISTANT:  Loni Dolly PA-C   INDICATIONS FOR PROCEDURE:  The patient is a 48 y.o. male with a long history of a painful hip.  This has persisted despite multiple conservative measures.  The patient has persisted with pain and dysfunction making rest and activity difficult.  A total hip replacement is offered as surgical treatment.  Informed operative consent was obtained after discussion of possible complications including reaction to anesthesia, infection, neurovascular injury, dislocation, DVT, PE, and death.  The importance of the postoperative rehab program to optimize result was stressed with the patient.  SUMMARY OF FINDINGS AND PROCEDURE:  Under general anesthesia through a anterior approach an the Hana table a right THR was performed.  The patient had severe degenerative change and excellent bone quality.  We used DePuy components to replace the hip and these were size KA12 Corail femur capped with a +1.5 50mm hip ball.  On the acetabular side we used a size 54 Gription shell with a  plus 4 neutral polyethylene liner.  We did use a hole eliminator.  Loni Dolly PA-C assisted throughout and was invaluable to the completion of the case in that he helped position and retract while I performed the procedure.  He also closed simultaneously to help minimize OR time.  I used fluoroscopy throughout the case to check position of implants and leg lengths and read all of these views myself.  DESCRIPTION OF PROCEDURE:  The patient was taken to the OR suite where general anesthetic was applied.  The patient was then positioned on the Hana table supine.  All bony prominences were appropriately padded.  Prep and drape was then performed in normal sterile fashion.  The patient was given Kefzol preoperative antibiotic and an  appropriate time out was performed.  We then took an anterior approach to the right hip.  Dissection was taken through adipose to the tensor fascia lata fascia.  This structure was incised longitudinally and we dissected in the intermuscular interval just medial to this muscle.  Cobra retractors were placed superior and inferior to the femoral neck superficial to the capsule.  A capsular incision was then made and the retractors were placed along the femoral neck.  Xray was brought in to get a good level for the femoral neck cut which was made with an oscillating saw and osteotome.  The femoral head was removed with a corkscrew.  The acetabulum was exposed and some labral tissues were excised. Reaming was taken to the inside wall of the pelvis and sequentially up to 1 mm smaller than the actual component.  A trial of components was done and then the aforementioned acetabular shell was placed in appropriate tilt and anteversion confirmed by fluoroscopy. The liner was placed along with the hole eliminator and attention was turned to the femur.  The leg was brought down and over into adduction and the elevator bar was used to raise the femur up gently in the wound.  The piriformis was released with care taken to preserve the obturator internus attachment and all of the posterior capsule. The femur was reamed and then broached to the appropriate size.  A trial reduction was done and the aforementioned head and neck assembly gave [place] the best stability in extension with external rotation.  Leg lengths were felt to be about equal by fluoroscopic exam.  The  trial components were removed and the wound irrigated.  We then placed the femoral component in appropriate anteversion.  The head was applied to a dry stem neck and the hip again reduced.  It was again stable in the aforementioned position.  The would was irrigated again followed by re-approximation of anterior capsule with ethibond suture. Tensor fascia was repaired  with V-loc suture  followed by subcutaneous closure with #O and #2 undyed vicryl.  Skin was closed with staples followed by a sterile dressing.  EBL and IOF can be obtained from anesthesia records.  DISPOSITION:  The patient was extubated in the OR and taken to PACU in stable condition to be admitted to the Orthopedic Surgery for appropriate post-op care to include perioperative antibiotics and DVT prophylaxis.

## 2013-08-16 NOTE — Anesthesia Procedure Notes (Signed)
Procedure Name: Intubation Date/Time: 08/16/2013 10:04 AM Performed by: Rush Farmer E Pre-anesthesia Checklist: Patient identified, Emergency Drugs available, Suction available, Patient being monitored and Timeout performed Patient Re-evaluated:Patient Re-evaluated prior to inductionOxygen Delivery Method: Circle system utilized Preoxygenation: Pre-oxygenation with 100% oxygen Intubation Type: IV induction Ventilation: Mask ventilation without difficulty and Oral airway inserted - appropriate to patient size Laryngoscope Size: Mac and 4 Grade View: Grade I Tube type: Oral Tube size: 7.5 mm Number of attempts: 1 Airway Equipment and Method: Stylet Placement Confirmation: ETT inserted through vocal cords under direct vision,  positive ETCO2 and breath sounds checked- equal and bilateral Secured at: 23 cm Tube secured with: Tape Dental Injury: Teeth and Oropharynx as per pre-operative assessment

## 2013-08-16 NOTE — Anesthesia Preprocedure Evaluation (Addendum)
Anesthesia Evaluation  Patient identified by MRN, date of birth, ID band  Reviewed: Allergy & Precautions, H&P , NPO status , Patient's Chart, lab work & pertinent test results  Airway Mallampati: II      Dental  (+) Missing   Pulmonary pneumonia -, former smoker,          Cardiovascular hypertension,     Neuro/Psych    GI/Hepatic negative GI ROS, Neg liver ROS,   Endo/Other  diabetes  Renal/GU Renal disease     Musculoskeletal   Abdominal   Peds  Hematology   Anesthesia Other Findings   Reproductive/Obstetrics                          Anesthesia Physical Anesthesia Plan  ASA: III  Anesthesia Plan: General   Post-op Pain Management:    Induction: Intravenous  Airway Management Planned: Oral ETT  Additional Equipment:   Intra-op Plan:   Post-operative Plan: Extubation in OR  Informed Consent: I have reviewed the patients History and Physical, chart, labs and discussed the procedure including the risks, benefits and alternatives for the proposed anesthesia with the patient or authorized representative who has indicated his/her understanding and acceptance.   Dental advisory given  Plan Discussed with: CRNA and Anesthesiologist  Anesthesia Plan Comments:         Anesthesia Quick Evaluation

## 2013-08-16 NOTE — Interval H&P Note (Signed)
History and Physical Interval Note:  08/16/2013 9:04 AM  [name]  has presented today for surgery, with the diagnosis of RIGHT HIP AVASCULAR NECROSIS  The various methods of treatment have been discussed with the patient and family. After consideration of risks, benefits and other options for treatment, the patient has consented to  Procedure(s): TOTAL HIP ARTHROPLASTY ANTERIOR APPROACH (Right) as a surgical intervention .  The patient's history has been reviewed, patient examined, no change in status, stable for surgery.  I have reviewed the patient's chart and labs.  Questions were answered to the patient's satisfaction.     Hessie Dibble

## 2013-08-16 NOTE — Transfer of Care (Signed)
Immediate Anesthesia Transfer of Care Note  Patient: [name]  Procedure(s) Performed: Procedure(s): TOTAL HIP ARTHROPLASTY ANTERIOR APPROACH (Right)  Patient Location: PACU  Anesthesia Type:General  Level of Consciousness: sedated  Airway & Oxygen Therapy: Patient Spontanous Breathing and Patient connected to face mask oxygen  Post-op Assessment: Report given to PACU RN, Post -op Vital signs reviewed and stable and Patient moving all extremities X 4  Post vital signs: Reviewed and stable  Complications: No apparent anesthesia complications

## 2013-08-16 NOTE — Anesthesia Postprocedure Evaluation (Signed)
  Anesthesia Post-op Note  Patient: [name]  Procedure(s) Performed: Procedure(s): TOTAL HIP ARTHROPLASTY ANTERIOR APPROACH (Right)  Patient Location: PACU  Anesthesia Type:General  Level of Consciousness: awake  Airway and Oxygen Therapy: Patient Spontanous Breathing  Post-op Pain: mild  Post-op Assessment: Post-op Vital signs reviewed  Post-op Vital Signs: Reviewed  Last Vitals:  Filed Vitals:   08/16/13 1245  BP: 123/66  Pulse: 87  Temp:   Resp: 11    Complications: No apparent anesthesia complications

## 2013-08-16 NOTE — Care Management Note (Signed)
CARE MANAGEMENT NOTE 08/16/2013  Patient:  MARKHI, KINDRED   Account Number:  [id number]  Date Initiated:  08/16/2013  Documentation initiated by:  Ricki Miller  Subjective/Objective Assessment:   48 yr old male s/p right total hip, anterior approach.     Action/Plan:   PT/OT Eval. CM will follow.  Patient preoperatively setup with [facility].   Anticipated DC Date:     Anticipated DC Plan:  [place]         Choice offered to / List presented to:             Status of service:  In process, will continue to follow

## 2013-08-16 NOTE — Progress Notes (Signed)
Utilization review completed.  

## 2013-08-17 LAB — BASIC METABOLIC PANEL
BUN: 28 mg/dL — ABNORMAL HIGH (ref 6–23)
CALCIUM: 9 mg/dL (ref 8.4–10.5)
CO2: 25 meq/L (ref 19–32)
CREATININE: 1.37 mg/dL — AB (ref 0.50–1.35)
Chloride: 102 mEq/L (ref 96–112)
GFR calc Af Amer: 71 mL/min — ABNORMAL LOW (ref >90)
GFR calc non Af Amer: 61 mL/min — ABNORMAL LOW (ref >90)
GLUCOSE: 198 mg/dL — AB (ref 70–99)
Potassium: 5.1 mEq/L (ref 3.7–5.3)
Sodium: 140 mEq/L (ref 137–147)

## 2013-08-17 LAB — CBC
HCT: 27.7 % — ABNORMAL LOW (ref 39.0–52.0)
HEMOGLOBIN: 10 g/dL — AB (ref 13.0–17.0)
MCH: 31.2 pg (ref 26.0–34.0)
MCHC: 36.1 g/dL — ABNORMAL HIGH (ref 30.0–36.0)
MCV: 86.3 fL (ref 78.0–100.0)
Platelets: 131 10*3/uL — ABNORMAL LOW (ref 150–400)
RBC: 3.21 MIL/uL — AB (ref 4.22–5.81)
RDW: 13.2 % (ref 11.5–15.5)
WBC: 9.3 10*3/uL (ref 4.0–10.5)

## 2013-08-17 MED ORDER — HYDROCODONE-ACETAMINOPHEN 7.5-325 MG PO TABS
1.0000 | ORAL_TABLET | ORAL | Status: DC | PRN
  Administered 2013-08-17 – 2013-08-18 (×5): 2 via ORAL
  Filled 2013-08-17 (×5): qty 2

## 2013-08-17 MED ORDER — CELECOXIB 200 MG PO CAPS
200.0000 mg | ORAL_CAPSULE | Freq: Two times a day (BID) | ORAL | Status: DC
  Administered 2013-08-17 – 2013-08-18 (×3): 200 mg via ORAL
  Filled 2013-08-17 (×4): qty 1

## 2013-08-17 NOTE — Progress Notes (Signed)
Occupational Therapy Evaluation and Discharge Patient Details Name: [name] MRN: [id number] DOB: 06-Apr-1966 Today's Date: 08/17/2013    History of Present Illness pt is 48 yo Male s/p THA direct anterior approach   Clinical Impression   PTA pt lived at home and was independent with ADLs. He reports that he has been wearing slip-on shoes as his pain has made it difficult to wear shoes with laces, however he would like to return to wearing shoes with laces. Education provided for compensatory techniques for LB ADLs and safe transfers at home. Educated pt on fall prevention techniques. Opportunity provided to pt to practice tub transfer and pt declined and verbalized correct technique for safe transfer. Pt reports he will have 24/7 assistance at home and is looking forward to returning to work. Acute OT to sign off at this time.     Follow Up Recommendations  No OT follow up;Supervision/Assistance - 24 hour    Equipment Recommendations  3 in 1 bedside comode       Precautions / Restrictions Precautions Precautions: None Restrictions Weight Bearing Restrictions: Yes RLE Weight Bearing: Weight bearing as tolerated      Mobility Bed Mobility               General bed mobility comments: not assessed- pt in recliner  Transfers Overall transfer level: Needs assistance Equipment used: Rolling walker (2 wheeled) Transfers: Sit to/from Stand Sit to Stand: Supervision         General transfer comment: supervision for safety. verbal cues for technique.    Balance Overall balance assessment: Needs assistance Sitting-balance support: No upper extremity supported;Feet supported Sitting balance-Leahy Scale: Good     Standing balance support: During functional activity;Bilateral upper extremity supported Standing balance-Leahy Scale: Poor                              ADL Overall ADL's : Needs assistance/impaired Eating/Feeding: Independent;Sitting    Grooming: Set up;Standing;Supervision/safety   Upper Body Bathing: Set up;Sitting   Lower Body Bathing: Minimal assistance;Sit to/from stand   Upper Body Dressing : Set up;Sitting   Lower Body Dressing: Moderate assistance;Sit to/from stand   Toilet Transfer: Supervision/safety (sit<>stand from recliner)   Toileting- Water quality scientist and Hygiene: Supervision/safety;Sit to/from stand       Functional mobility during ADLs: Supervision/safety;Rolling walker General ADL Comments: Pt participated in ADL activities and reports that he wears slip on shoes due to pain in hip. Pt would like to return to shoes with laces and educated pt on reaching shoes to facilitate flexion of hip while sitting in chair. Opportunity provided to practuce tub transfer and pt declined and verbalized correct sequencing of tub transfer. Educated pt on LB ADLs with compensatory techniques.      Vision  Per pt report, no change from baseline.                   Perception Perception Perception Tested?: No   Praxis Praxis Praxis tested?: Not tested    Pertinent Vitals/Pain Pt reports pain as moderate, no pain score provided. RN administered pain medication during OT session. Following session, repositioned pt in recliner and applied ice to R hip surgical site.      Hand Dominance Right   Extremity/Trunk Assessment Upper Extremity Assessment Upper Extremity Assessment: Overall WFL for tasks assessed   Lower Extremity Assessment Lower Extremity Assessment: Defer to PT evaluation   Cervical / Trunk Assessment Cervical /  Trunk Assessment: Normal   Communication Communication Communication: No difficulties   Cognition Arousal/Alertness: Awake/alert Behavior During Therapy: WFL for tasks assessed/performed Overall Cognitive Status: Within Functional Limits for tasks assessed                                Home Living Family/patient expects to be discharged to:: Private  residence Living Arrangements: Other relatives (sister) Available Help at Discharge: Family;Available 24 hours/day Type of Home: House Home Access: Stairs to enter [employer] of Steps: 1 (1+1) Entrance Stairs-Rails: Right Home Layout: Two level (able to live upstairs with bedroom/bathroom) Alternate Level Stairs-Number of Steps: flight Alternate Level Stairs-Rails: Left Bathroom Shower/Tub: Tub/shower unit Shower/tub characteristics: Architectural technologist: Standard     Home Equipment: Cane - single point          Prior Functioning/Environment Level of Independence: Independent with assistive device(s)        Comments: cane for long distances              End of Session Equipment Utilized During Treatment: Gait belt;Rolling walker Nurse Communication: Patient requests pain meds  Activity Tolerance: Patient tolerated treatment well Patient left: in chair;with call bell/phone within reach   Time: 1024-1052 OT Time Calculation (min): 28 min Charges:  OT General Charges $OT Visit: 1 Procedure OT Evaluation $Initial OT Evaluation Tier I: 1 Procedure OT Treatments $Self Care/Home Management : 8-22 mins  Juluis Rainier [id number] 08/17/2013, 3:23 PM

## 2013-08-17 NOTE — Progress Notes (Signed)
Physical Therapy Treatment Patient Details Name: [name] MRN: [id number] DOB: 17-Sep-1965 Today's Date: 08/17/2013    History of Present Illness s/p R THA anterior approach    PT Comments    Pt tolerated treatment well, progressing with gait distance and mechanics. Stair training completed this AM. Pt with loss of balance when attempting to step backwards requiring physical assistance to correct; states he has poor sensation in feet due to a back injury that he never sought treatment for. Pt educated on safe mobility in home by preventing backwards stepping. Pt would benefit greatly from skilled PT intervention at home in order to increase safety with mobility and functional independence.   Follow Up Recommendations  Home health PT;Supervision - Intermittent     Equipment Recommendations  Rolling walker with 5" wheels;3in1 (PT) (Tall)    Recommendations for Other Services OT consult     Precautions / Restrictions Precautions Precautions: Fall Precaution Comments: Pt fall risk with backing up. Restrictions Weight Bearing Restrictions: Yes RLE Weight Bearing: Weight bearing as tolerated    Mobility  Bed Mobility Overal bed mobility: Needs Assistance Bed Mobility: Sit to Supine       Sit to supine: Min assist   General bed mobility comments: Min assist for RLE into bed. Taught to use LLE for assist.  Transfers Overall transfer level: Needs assistance Equipment used: Rolling walker (2 wheeled) Transfers: Sit to/from Stand Sit to Stand: Supervision         General transfer comment: supervision for safety. verbal cues for technique.  Ambulation/Gait Ambulation/Gait assistance: Min guard;Min assist Ambulation Distance (Feet): 200 Feet Assistive device: Rolling walker (2 wheeled) Gait Pattern/deviations: Step-through pattern;Decreased stride length;Scissoring;Antalgic;Narrow base of support     General Gait Details: Pt improved with sequencing. Continues to  need cues for wider BOS. Trial of ambulating backwards and pt has great difficulty due to scissoring requiring min assist to regain balance. Pt does not control RW well while stepping backwards. Educated for turning within [place] when d/c to home and not to attempt backwards stepping without further physical therapy intervention.   Stairs            Wheelchair Mobility    Modified Rankin (Stroke Patients Only)       Balance Overall balance assessment: Needs assistance Sitting-balance support: No upper extremity supported;Feet supported Sitting balance-Leahy Scale: Good     Standing balance support: During functional activity;Bilateral upper extremity supported Standing balance-Leahy Scale: Poor                      Cognition Arousal/Alertness: Awake/alert Behavior During Therapy: WFL for tasks assessed/performed Overall Cognitive Status: Within Functional Limits for tasks assessed                      Exercises Total Joint Exercises Ankle Circles/Pumps: AROM;Both;10 reps;Seated Hip ABduction/ADduction: AAROM;Right;10 reps;Supine    General Comments General comments (skin integrity, edema, etc.): Pt with decreased foot sensation bilaterally, he states is from a back injury that he never sought treatment for. I believe this is contributing to poor balance with backwards stepping. Pt educated on safe mobility without having to step backwards.      Pertinent Vitals/Pain Pt states his pain is "tolerable" no numerical value given Patient repositioned in bed for comfort.     Home Living Family/patient expects to be discharged to:: Private residence Living Arrangements: Other relatives (sister) Available Help at Discharge: Family;Available 24 hours/day Type of Home: House Home Access:  Stairs to enter Entrance Stairs-Rails: Right Home Layout: Two level (able to live upstairs with bedroom/bathroom) Home Equipment: Cane - single point      Prior Function  Level of Independence: Independent with assistive device(s)      Comments: cane for long distances   PT Goals (current goals can now be found in the care plan section) Acute Rehab PT Goals Patient Stated Goal: to walk without pain PT Goal Formulation: With patient Time For Goal Achievement: 08/23/13 Potential to Achieve Goals: Good Progress towards PT goals: Progressing toward goals    Frequency  7X/week    PT Plan Current plan remains appropriate    Co-evaluation             End of Session Equipment Utilized During Treatment: Gait belt Activity Tolerance: Patient tolerated treatment well Patient left: with call bell/phone within reach;in bed     Time: 1530-1556 PT Time Calculation (min): 26 min  Charges:  $Gait Training: 8-22 mins $Self Care/Home Management: 8-22                    G Codes:      Elayne Snare, [place]  Ellouise Newer 08/17/2013, 4:59 PM

## 2013-08-17 NOTE — Progress Notes (Signed)
Physical Therapy Treatment Patient Details Name: [name] MRN: [id number] DOB: 1965/08/05 Today's Date: 08/17/2013    History of Present Illness s/p R THA anterior approach    PT Comments    Pt tolerated treatment well ambulating up to 150 feet with min guard for safety. Mild balance issues, he states this is his baseline due to arthritis of his non-operative hip. Pt completed stair training and demonstrates safe understanding of this task. Pt will benefit from continued skilled PT services in home setting when d/c, focusing on increasing independence with functional mobility.  Follow Up Recommendations  Home health PT;Supervision - Intermittent     Equipment Recommendations  Rolling walker with 5" wheels;3in1 (PT) (Tall)    Recommendations for Other Services OT consult     Precautions / Restrictions Precautions Precautions: None Restrictions Weight Bearing Restrictions: Yes RLE Weight Bearing: Weight bearing as tolerated    Mobility  Bed Mobility                  Transfers Overall transfer level: Needs assistance Equipment used: Rolling walker (2 wheeled) Transfers: Sit to/from Stand Sit to Stand: Supervision         General transfer comment: supervision for safety. verbal cues for technique.  Ambulation/Gait Ambulation/Gait assistance: Min guard Ambulation Distance (Feet): 150 Feet (additional bout of 100 feet) Assistive device: Rolling walker (2 wheeled) Gait Pattern/deviations: Step-through pattern;Decreased stride length;Narrow base of support     General Gait Details: Verbal cues for gait sequencing. Minimal loss of balance to posterior and to left on 3 occasions, however able to correct himself without assist. Pt states this is his baseline due to arthritis of the L hip as well. Pt with narrow BOS, able to correct with intermittent verbal cues. Progressed with gait during therapy to continuous movement of RW in forward direction using UEs  minimally for support   Stairs Stairs: Yes Stairs assistance: Min guard Stair Management: One rail Left;Step to pattern;Sideways Number of Stairs: 13 General stair comments: Pt ascend/descend full flight of steps with min guard. PT demonstrated and pt asked to teach back correct technique. Pt safely performs holding on to one rail safely. He has no further questions and states he feels comfortable completing this task.  Wheelchair Mobility    Modified Rankin (Stroke Patients Only)       Balance                                    Cognition Arousal/Alertness: Awake/alert Behavior During Therapy: WFL for tasks assessed/performed Overall Cognitive Status: Within Functional Limits for tasks assessed                      Exercises Total Joint Exercises Ankle Circles/Pumps: AROM;Both;10 reps;Seated Gluteal Sets: AROM;Both;10 reps;Seated    General Comments General comments (skin integrity, edema, etc.): Reviewed light exercises, and discussed safe mobility with gait training, transfers, and stair navigation. Pt with mild balance issues, stating this is his baseline due to arthritis of his non-operative hip. He was able to safely correct himself but tends to lean too far posteriorly or to left at times, needing to use the RW for support.      Pertinent Vitals/Pain Pt states pain 5/10 and reports nursing has given him pain medication prior to PT session Patient repositioned in chair for comfort.     Home Living  Prior Function            PT Goals (current goals can now be found in the care plan section) Acute Rehab PT Goals Patient Stated Goal: to walk without pain PT Goal Formulation: With patient Time For Goal Achievement: 08/23/13 Potential to Achieve Goals: Good Progress towards PT goals: Progressing toward goals    Frequency  7X/week    PT Plan Current plan remains appropriate    Co-evaluation              End of Session Equipment Utilized During Treatment: Gait belt Activity Tolerance: Patient tolerated treatment well Patient left: in chair;with call bell/phone within reach (eating late lunch)     Time: [id number] PT Time Calculation (min): 33 min  Charges:  $Gait Training: 23-37 mins                    G CodesCamille Bal [place], [place]  Ellouise Newer 08/17/2013, 12:35 PM

## 2013-08-17 NOTE — Progress Notes (Signed)
Subjective: 1 Day Post-Op Procedure(s) (LRB): TOTAL HIP ARTHROPLASTY ANTERIOR APPROACH (Right)  Activity level:  wbat Diet tolerance:  Eating well Voiding:  ok Patient reports pain as 3 on 0-10 scale.    Objective: Vital signs in last 24 hours: Temp:  [97.3 F (36.3 C)-98.4 F (36.9 C)] 97.9 F (36.6 C) (04/29 0534) Pulse Rate:  [69-88] 88 (04/29 0534) Resp:  [6-20] 20 (04/29 0534) BP: (97-133)/(56-94) 129/94 mmHg (04/29 0534) SpO2:  [97 %-100 %] 98 % (04/29 0534) Weight:  [100.8 kg (222 lb 3.6 oz)] 100.8 kg (222 lb 3.6 oz) (04/28 1431)  Labs:  Recent Labs  08/17/13 0355  HGB 10.0*    Recent Labs  08/17/13 0355  WBC 9.3  RBC 3.21*  HCT 27.7*  PLT 131*    Recent Labs  08/17/13 0355  NA 140  K 5.1  CL 102  CO2 25  BUN 28*  CREATININE 1.37*  GLUCOSE 198*  CALCIUM 9.0   No results found for this basename: LABPT, INR,  in the last 72 hours  Physical Exam:  Neurologically intact ABD soft Neurovascular intact Sensation intact distally Dorsiflexion/Plantar flexion intact Incision: dressing C/D/I No cellulitis present Compartment soft  Assessment/Plan:  1 Day Post-Op Procedure(s) (LRB): TOTAL HIP ARTHROPLASTY ANTERIOR APPROACH (Right) Advance diet Up with therapy D/C IV fluids Plan for discharge tomorrow Discharge home with home health Continue asa 325mg  BID x 4 weeks Start celebrex BID as it is a home medicine. Follow up in office in 2 weeks.     Larwance Sachs Sharina Petre 08/17/2013, 8:08 AM

## 2013-08-17 NOTE — Progress Notes (Signed)
CSW (Clinical Education officer, museum) received consult for SNF. At this time, PT recommending HH. Pt has no social work needs at this time.  [place], [place]

## 2013-08-18 ENCOUNTER — Encounter (HOSPITAL_COMMUNITY): Payer: Self-pay | Admitting: Orthopaedic Surgery

## 2013-08-18 LAB — CBC
HCT: 25.6 % — ABNORMAL LOW (ref 39.0–52.0)
Hemoglobin: 8.8 g/dL — ABNORMAL LOW (ref 13.0–17.0)
MCH: 30.7 pg (ref 26.0–34.0)
MCHC: 34.4 g/dL (ref 30.0–36.0)
MCV: 89.2 fL (ref 78.0–100.0)
PLATELETS: DECREASED 10*3/uL (ref 150–400)
RBC: 2.87 MIL/uL — ABNORMAL LOW (ref 4.22–5.81)
RDW: 13.6 % (ref 11.5–15.5)
WBC: 5.8 10*3/uL (ref 4.0–10.5)

## 2013-08-18 MED ORDER — HYDROCODONE-ACETAMINOPHEN 7.5-325 MG PO TABS: 1.0000 | ORAL_TABLET | Freq: Four times a day (QID) | ORAL | Status: DC | PRN

## 2013-08-18 MED ORDER — METHOCARBAMOL 500 MG PO TABS: 500.0000 mg | ORAL_TABLET | Freq: Four times a day (QID) | ORAL | Status: DC | PRN

## 2013-08-18 MED ORDER — ASPIRIN 325 MG PO TBEC: 325.0000 mg | DELAYED_RELEASE_TABLET | Freq: Two times a day (BID) | ORAL | Status: DC

## 2013-08-18 NOTE — Discharge Summary (Signed)
Patient ID: [name] MRN: [id number] DOB/AGE: [birth year] 48 y.o.  Admit date: 08/16/2013 Discharge date: 08/18/2013  Admission Diagnoses:  Principal Problem:   Degenerative joint disease (DJD) of hip   Discharge Diagnoses:  Same  Past Medical History  Diagnosis Date  . Anxiety   . Depression     bipolar [facility]  . Hypogonadism male   . ADD (attention deficit disorder)   . Microscopic hematuria     hereditary s/p Urology eval  . Bipolar 1 disorder   . Arthritis     right hip  . Family history of anesthesia complication     pt is unsure , but pt father may have been difficult to arouse   . Pneumonia 10-2012  . Kidney stones   . Blood in urine     Surgeries: Procedure(s): TOTAL HIP ARTHROPLASTY ANTERIOR APPROACH on 08/16/2013   Consultants:    Discharged Condition: Improved  Hospital Course: [name] is an 48 y.o. male who was admitted 08/16/2013 for operative treatment ofDegenerative joint disease (DJD) of hip. Patient has severe unremitting pain that affects sleep, daily activities, and work/hobbies. After pre-op clearance the patient was taken to the operating room on 08/16/2013 and underwent  Procedure(s): TOTAL HIP ARTHROPLASTY ANTERIOR APPROACH.    Patient was given perioperative antibiotics: Anti-infectives   Start     Dose/Rate Route Frequency Ordered Stop   08/16/13 1530  ceFAZolin (ANCEF) IVPB 2 g/50 mL premix     2 g 100 mL/hr over 30 Minutes Intravenous Every 6 hours 08/16/13 1441 08/16/13 2216   08/16/13 0600  ceFAZolin (ANCEF) IVPB 2 g/50 mL premix     2 g 100 mL/hr over 30 Minutes Intravenous On call to O.R. 08/15/13 1444 08/16/13 1010       Patient was given sequential compression devices, early ambulation, and chemoprophylaxis to prevent DVT.  Patient benefited maximally from hospital stay and there were no complications.    Recent vital signs: Patient Vitals for the past 24 hrs:  BP Temp Temp src Pulse Resp SpO2  08/17/13 2045  101/56 mmHg 98.4 F (36.9 C) - 83 18 97 %  08/17/13 1419 128/78 mmHg 98.1 F (36.7 C) Oral 86 18 98 %     Recent laboratory studies:  Recent Labs  08/17/13 0355 08/18/13 0656  WBC 9.3 5.8  HGB 10.0* 8.8*  HCT 27.7* 25.6*  PLT 131* PENDING  NA 140  --   K 5.1  --   CL 102  --   CO2 25  --   BUN 28*  --   CREATININE 1.37*  --   GLUCOSE 198*  --   CALCIUM 9.0  --      Discharge Medications:     Medication List         aspirin 325 MG EC tablet  Take 1 tablet (325 mg total) by mouth 2 (two) times daily after a meal.     buPROPion 150 MG 12 hr tablet  Commonly known as:  WELLBUTRIN SR  Take 150 mg by mouth 3 (three) times daily.     cholecalciferol 1000 UNITS tablet  Commonly known as:  VITAMIN D  Take 1,000 Units by mouth every morning.     clonazepam 2 MG disintegrating tablet  Commonly known as:  KLONOPIN  Take 1 tablet (2 mg total) by mouth 3 (three) times daily as needed.     cyanocobalamin 500 MCG tablet  Take 500 mcg by mouth every morning.  divalproex 500 MG 24 hr tablet  Commonly known as:  DEPAKOTE ER  Take 1,000 mg by mouth at bedtime.     GLUCOSAMINE MSM COMPLEX PO  Take 2 capsules by mouth 2 (two) times daily.     HYDROcodone Bitartrate ER 40 MG T24a  Commonly known as:  HYSINGLA ER  Take 40 mg by mouth daily.     HYDROcodone-acetaminophen 7.5-325 MG per tablet  Commonly known as:  NORCO  Take 1-2 tablets by mouth every 6 (six) hours as needed for moderate pain.     methocarbamol 500 MG tablet  Commonly known as:  ROBAXIN  Take 1 tablet (500 mg total) by mouth every 6 (six) hours as needed for muscle spasms.     multivitamin with minerals Tabs tablet  Take 1 tablet by mouth every morning.     QUEtiapine 300 MG 24 hr tablet  Commonly known as:  SEROQUEL XR  Take 600 mg by mouth at bedtime.     tadalafil 5 MG tablet  Commonly known as:  CIALIS  Take 5 mg by mouth daily.     Testosterone 20.25 MG/1.25GM (1.62%) Gel  Commonly known  as:  ANDROGEL  Place 3 application onto the skin daily.        Diagnostic Studies: Dg Chest 2 View  08/09/2013   CLINICAL DATA:  Prior history of kidney stones.  EXAM: CHEST  2 VIEW  COMPARISON:  CT ABD/PELV WO CM dated 06/14/2013; DG CHEST 1V PORT dated 11/05/2012; DG CHEST 1V PORT dated 11/02/2012  FINDINGS: Mediastinum and hilar structures are normal. Pleural parenchymal thickening of the left lung base consistent with scarring. No focal acute infiltrate. Cardiovascular structures are unremarkable. Pulmonary vascularity normal. No pneumothorax. No acute osseous abnormality. Degenerative changes thoracolumbar spine.  IMPRESSION: Pleural-parenchymal scarring left chest. No acute cardiopulmonary disease.   Electronically Signed   By: Marcello Moores  Register   On: 08/09/2013 14:47   Dg Hip Operative Right  08/16/2013   CLINICAL DATA:  Post right anterior approach total hip replacement  EXAM: DG OPERATIVE RIGHT HIP  FLUOROSCOPY TIME:  38 seconds  COMPARISON:  DG HIP COMPLETE*R* dated 06/08/2013  FINDINGS: Two spot intraoperative radiographic images of the right hip are provided for review.  Images demonstrate the sequela of right total hip replacement. Alignment appears anatomic on the provided AP projection radiographs. No definite fracture.  There is a minimal amount of subcutaneous emphysema about the operative site. No radiopaque foreign body.  IMPRESSION: Post right total hip replacement without evidence of complication.   Electronically Signed   By: Sandi Mariscal M.D.   On: 08/16/2013 13:44   Ct Lumbar Spine Wo Contrast  07/25/2013   CLINICAL DATA:  Low back pain than right leg pain. Previous lumbar fusion. Question pseudoarthrosis.  EXAM: CT LUMBAR SPINE WITHOUT CONTRAST  TECHNIQUE: Multidetector CT imaging of the lumbar spine was performed without intravenous contrast administration. Multiplanar CT image reconstructions were also generated.  COMPARISON:  CT ABD/PELV WO CM dated 06/14/2013; MR L SPINE W/O dated  07/30/2012  FINDINGS: Solid fusion is present at L4-5. There is no evidence for loosening about the pedicle screws.  The paraspinous soft tissues demonstrate mild atherosclerotic calcifications within the aorta without aneurysm.  T12-L1:  Negative.  L1-2: Mild disc bulging is present. There is no significant stenosis or change.  L2-3: A mild broad-based disc bulge is present. Mild facet hypertrophy is noted bilaterally. This results in mild foraminal stenosis on both sides.  L3-4: A broad-based disc  herniation is present. Moderate facet hypertrophy and ligamentum flavum thickening is evident. Mild lateral recess and foraminal stenosis is present bilaterally.  L4-5: Patient is status post laminectomy. There is some osseous overgrowth from the left facet fusion which partially encroaches on the left foramen. The central canal is patent.  L5-S1: Mild facet hypertrophy is present bilaterally. No significant stenosis. This is a transitional S1 segment with fusion to the right sacral ala.  IMPRESSION: 1. Mild disc bulge and facet hypertrophy at L1-2 and L2-3 without significant stenosis. 2. Mild lateral recess and foraminal narrowing bilaterally at L3-4 due to moderate facet hypertrophy and ligamentum flavum thickening. 3. Postoperative changes at L4-5 with mild overgrowth at the facet fusion on the left resulting in some encroachment on the neural foramen without significant stenosis. 4. Transitional L5 segment without significant stenosis at the L5-S1 level.   Electronically Signed   By: Lawrence Santiago M.D.   On: 07/25/2013 17:00   Mr Lumbar Spine W Wo Contrast  07/25/2013   CLINICAL DATA:  48 year old male with pain radiating to the right buttock and lower extremity, increasing. Prior fusion in 2004. Initial encounter.  EXAM: MRI LUMBAR SPINE WITHOUT AND WITH CONTRAST  TECHNIQUE: Multiplanar and multiecho pulse sequences of the lumbar spine were obtained without and with intravenous contrast.  CONTRAST:  77mL  MULTIHANCE GADOBENATE DIMEGLUMINE 529 MG/ML IV SOLN  COMPARISON:  Lumbar spine CT from today reported separately. Lumbar MRI 07/30/2012.  FINDINGS: Same numbering system as in 2014, also utilized on today CT. Mild susceptibility artifact related to posterior and interbody fusion hardware at L4-L5. Stable vertebral height and alignment. No marrow edema or evidence of acute osseous abnormality.  No abnormal intradural enhancement. Visualized lower thoracic spinal cord is normal with conus medularis at T12.  Stable postoperative changes to the paraspinal soft tissues in the lower lumbar spine. Negative visualized abdominal viscera.  T12-L1:  Negative.  L1-L2: Asymmetric signal in the right L1 neural foramina and lateral epidural space, rim enhancing (series 9, image 3). Associated mild mass effect on the right lateral thecal sac. Obscuration of the exiting right L1 nerve. Severe right lateral recess stenosis (descending right L2 nerve root level) This space-occupying lesion encompasses 8 x 7 x 20 mm (AP by transverse by CC). There is an associated annular fissure of the right paracentral disc best seen on series 5, image 7.  Otherwise chronic circumferential disc bulge and mild facet hypertrophy at this level are stable.  L2-L3:  Stable mild to moderate disc bulge and facet hypertrophy.  L3-L4: Chronic moderate circumferential disc bulge and moderate to severe facet hypertrophy (greater on the right. Stable moderate ligament flavum hypertrophy. Trace facet joint fluid has increased. Stable mild right lateral recess stenosis. Small superimposed central annular fissure and caudal broad-based disc protrusion (series 4, image 22).  L4-L5:  Stable status post decompression and fusion.  L5-S1:  Stable, negative disc.  No stenosis.  IMPRESSION: 1. Right foraminal/lateral recess disc extrusion at L1-L2 with sequestered disc fragment suspected (series 4, images 2 and 3) approximating 8 x 7 x 20 mm. Severe involvement of the  exiting right L1 nerve and descending right L2 nerve roots without significant spinal stenosis. 2. Stable postoperative appearance of L4-L5. 3. Mild progression of chronic adjacent segment disease at L3-L4.   Electronically Signed   By: Lars Pinks M.D.   On: 07/25/2013 18:26    Disposition: 01-Home or Self Care      Discharge Orders   Future Orders Complete By  Expires   Call MD / Call 911  As directed    Constipation Prevention  As directed    Diet - low sodium heart healthy  As directed    Increase activity slowly as tolerated  As directed       Follow-up Information   Follow up with Hessie Dibble, MD In 2 weeks.   Specialty:  Orthopedic Surgery   Contact information:   [place] [address] [phone]        Signed: Larwance Sachs Levy [place] 08/18/2013, 8:56 AM

## 2013-08-18 NOTE — Care Management Note (Signed)
CARE MANAGEMENT NOTE 08/18/2013  Patient:  [name], [name]   Account Number:  [id number]  Date Initiated:  08/16/2013  Documentation initiated by:  Ricki Miller  Subjective/Objective Assessment:   48 yr old male s/p right total hip, anterior approach.     Action/Plan:   PT/OT Eval. CM will follow.  Patient preoperatively setup with [facility].No changes.   Anticipated DC Date:  08/18/2013   Anticipated DC Plan:  [place]  CM consult      [facility] Choice  HOME HEALTH  DURABLE MEDICAL EQUIPMENT   Choice offered to / List presented to:  C-1 Patient   DME arranged  3-N-1  [place]      DME agency  [employer]     HH arranged  HH-2 PT      [place].   Status of service:  Completed, signed off Medicare Important Message given?   (If response is "NO", the following Medicare IM given date fields will be blank) Date Medicare IM given:   Date Additional Medicare IM given:    Discharge Disposition:  [place]

## 2013-08-18 NOTE — Progress Notes (Signed)
Physical Therapy Treatment Patient Details Name: [name] MRN: [id number] DOB: [birth year] Today's Date: 08/18/2013    History of Present Illness s/p R THA anterior approach    PT Comments    Pt tolerated treatment well this afternoon, ambulating up to 350 feet and completing additional stair training at his request. He performed these tasks well and states he feels more confident after additional therapy session this afternoon. All education reviewed and pt is compliant with home exercise program. He will benefit from continued skilled PT in home setting for further improving independence with functional mobility.   Follow Up Recommendations  Home health PT;Supervision - Intermittent     Equipment Recommendations  Rolling walker with 5" wheels;3in1 (PT) (Tall)    Recommendations for Other Services OT consult     Precautions / Restrictions Precautions Precautions: Fall Precaution Comments: Pt fall risk with backing up. Restrictions Weight Bearing Restrictions: Yes RLE Weight Bearing: Weight bearing as tolerated    Mobility  Bed Mobility Overal bed mobility: Modified Independent             General bed mobility comments: Mod I with bed mobility, no physical assist needed  Transfers Overall transfer level: Needs assistance Equipment used: Rolling walker (2 wheeled) Transfers: Sit to/from Stand Sit to Stand: Supervision         General transfer comment: supervision for safety. verbal cues for technique. Demonstrates correct hand placement  Ambulation/Gait Ambulation/Gait assistance: Supervision Ambulation Distance (Feet): 350 Feet Assistive device: Rolling walker (2 wheeled) Gait Pattern/deviations: Step-through pattern;Decreased step length - left;Decreased stance time - right;Antalgic;Narrow base of support     General Gait Details: Verbal cues for wider BOS during ambulation. Pt able to do this but periodically needs reminded as he continues to scissor  gait at times.   Stairs Stairs: Yes Stairs assistance: Min guard Stair Management: One rail Left;Step to pattern;Sideways Number of Stairs: 13 General stair comments: Pt performed additonal bout of stair training this afternoon as he stated this AM he was not entirely confident. He was able to perform this task safely with verbal cues for technique. He needed one verbal cue for sequencing, but was able to re-state correctly at end of therapy session  Wheelchair Mobility    Modified Rankin (Stroke Patients Only)       Balance                                    Cognition Arousal/Alertness: Awake/alert Behavior During Therapy: WFL for tasks assessed/performed Overall Cognitive Status: Within Functional Limits for tasks assessed                      Exercises      General Comments General comments (skin integrity, edema, etc.): Reviewed education for safe mobility, explaining that patient should not attempt backwards walking in home and should be cognizant of this at all times until he receives further therapy.      Pertinent Vitals/Pain No complaints of pain Patient repositioned in bed for comfort.     Home Living                      Prior Function            PT Goals (current goals can now be found in the care plan section) Acute Rehab PT Goals Patient Stated Goal: to walk without pain PT Goal Formulation:  With patient Time For Goal Achievement: 08/23/13 Potential to Achieve Goals: Good Progress towards PT goals: Progressing toward goals    Frequency  7X/week    PT Plan Current plan remains appropriate    Co-evaluation             End of Session Equipment Utilized During Treatment: Gait belt Activity Tolerance: Patient tolerated treatment well Patient left: with call bell/phone within reach;in bed     Time: [id number] PT Time Calculation (min): 20 min  Charges:  $Gait Training: 8-22 mins                    G  Codes:      Camille Bal [place], [place] 08/18/2013, 3:59 PM

## 2013-08-18 NOTE — Progress Notes (Signed)
Physical Therapy Treatment Patient Details Name: [name] MRN: [id number] DOB: Nov 11, 1965 Today's Date: 08/18/2013    History of Present Illness s/p R THA anterior approach    PT Comments    Pt progressing well towards physical therapy goals, ambulating up to 520 feet this AM with min guard for safety. Pt will continue to benefit from skilled PT in home setting focusing on increasing independence with functional mobility. Will continue to follow until d/c.  Follow Up Recommendations  Home health PT;Supervision - Intermittent     Equipment Recommendations  Rolling walker with 5" wheels;3in1 (PT) (Tall)    Recommendations for Other Services OT consult     Precautions / Restrictions Precautions Precautions: Fall Precaution Comments: Pt fall risk with backing up. Restrictions Weight Bearing Restrictions: Yes RLE Weight Bearing: Weight bearing as tolerated    Mobility  Bed Mobility Overal bed mobility: Needs Assistance       Supine to sit: Supervision     General bed mobility comments: Supervision with supine>sit. Requires extra time. Pt uses LLE to assist RLE out of bed. Cues for technique  Transfers Overall transfer level: Needs assistance Equipment used: Rolling walker (2 wheeled) Transfers: Sit to/from Stand Sit to Stand: Supervision         General transfer comment: supervision for safety. verbal cues for technique. Demonstrates correct hand placement  Ambulation/Gait Ambulation/Gait assistance: Min guard Ambulation Distance (Feet): 520 Feet Assistive device: Rolling walker (2 wheeled) Gait Pattern/deviations: Step-to pattern;Step-through pattern;Decreased stride length;Antalgic;Narrow base of support;Scissoring     General Gait Details: Gait training focused on increasing stride length. Progressed to step-through technique quickly with verbal cues this AM. Continues to need cues for increasing base of support as he tends to scissor his  steps.   Stairs            Wheelchair Mobility    Modified Rankin (Stroke Patients Only)       Balance                                    Cognition Arousal/Alertness: Awake/alert Behavior During Therapy: WFL for tasks assessed/performed Overall Cognitive Status: Within Functional Limits for tasks assessed                      Exercises Total Joint Exercises Ankle Circles/Pumps: AROM;Both;10 reps;Seated Heel Slides: AROM;5 reps;Right;Supine Straight Leg Raises: Right;10 reps;Supine;AAROM Long Arc Quad: AROM;Right;10 reps;Supine    General Comments General comments (skin integrity, edema, etc.): Reviewed exercise program. Pt states he is able to complete supine exercises without difficulty. States he is a little sore this AM.      Pertinent Vitals/Pain Pt reports he would like more pain medication. No numerical pain value given Nurse aware Patient repositioned in chair for comfort.     Home Living                      Prior Function            PT Goals (current goals can now be found in the care plan section) Acute Rehab PT Goals Patient Stated Goal: to walk without pain PT Goal Formulation: With patient Time For Goal Achievement: 08/23/13 Potential to Achieve Goals: Good Progress towards PT goals: Progressing toward goals    Frequency  7X/week    PT Plan Current plan remains appropriate    Co-evaluation  End of Session Equipment Utilized During Treatment: Gait belt Activity Tolerance: Patient tolerated treatment well Patient left: with call bell/phone within reach;in chair     Time: [id number] PT Time Calculation (min): 29 min  Charges:  $Gait Training: 8-22 mins $Therapeutic Exercise: 8-22 mins                    G Codes:      [employer], [place]  Ellouise Newer 08/18/2013, 9:16 AM

## 2013-08-19 ENCOUNTER — Ambulatory Visit: Payer: Self-pay | Admitting: Internal Medicine

## 2013-09-07 ENCOUNTER — Telehealth: Payer: Self-pay | Admitting: Internal Medicine

## 2013-09-07 NOTE — Telephone Encounter (Signed)
Patient came into the office to request refills on his Testosterone (ANDROGEL) and HYDROcodone Bitartrate ER (HYSINGLA ER). He is using a patient assistance program for each of these and will need printed rx's. Please advise.

## 2013-09-09 ENCOUNTER — Ambulatory Visit (INDEPENDENT_AMBULATORY_CARE_PROVIDER_SITE_OTHER): Payer: BC Managed Care – PPO | Admitting: Internal Medicine

## 2013-09-09 ENCOUNTER — Other Ambulatory Visit: Payer: Self-pay | Admitting: *Deleted

## 2013-09-09 ENCOUNTER — Encounter: Payer: Self-pay | Admitting: Internal Medicine

## 2013-09-09 VITALS — BP 142/80 | HR 80 | Temp 97.4°F | Resp 16 | Wt 229.0 lb

## 2013-09-09 DIAGNOSIS — M545 Low back pain, unspecified: Secondary | ICD-10-CM

## 2013-09-09 DIAGNOSIS — I10 Essential (primary) hypertension: Secondary | ICD-10-CM

## 2013-09-09 DIAGNOSIS — M169 Osteoarthritis of hip, unspecified: Secondary | ICD-10-CM

## 2013-09-09 DIAGNOSIS — M25552 Pain in left hip: Secondary | ICD-10-CM

## 2013-09-09 DIAGNOSIS — F411 Generalized anxiety disorder: Secondary | ICD-10-CM

## 2013-09-09 DIAGNOSIS — M1610 Unilateral primary osteoarthritis, unspecified hip: Secondary | ICD-10-CM

## 2013-09-09 DIAGNOSIS — M1611 Unilateral primary osteoarthritis, right hip: Secondary | ICD-10-CM

## 2013-09-09 DIAGNOSIS — M25559 Pain in unspecified hip: Secondary | ICD-10-CM

## 2013-09-09 DIAGNOSIS — F17200 Nicotine dependence, unspecified, uncomplicated: Secondary | ICD-10-CM

## 2013-09-09 DIAGNOSIS — M25551 Pain in right hip: Secondary | ICD-10-CM

## 2013-09-09 MED ORDER — TADALAFIL 5 MG PO TABS: 5.0000 mg | ORAL_TABLET | Freq: Every day | ORAL | Status: DC

## 2013-09-09 MED ORDER — HYDROCODONE BITARTRATE ER 60 MG PO T24A: 60.0000 mg | EXTENDED_RELEASE_TABLET | Freq: Every day | ORAL | Status: DC

## 2013-09-09 NOTE — Progress Notes (Signed)
Pre visit review using our clinic review tool, if applicable. No additional management support is needed unless otherwise documented below in the visit note. 

## 2013-09-09 NOTE — Telephone Encounter (Signed)
Ok Thx 

## 2013-09-09 NOTE — Assessment & Plan Note (Signed)
Continue with current prescription therapy as reflected on the Med list.  

## 2013-09-09 NOTE — Assessment & Plan Note (Signed)
S/p THR

## 2013-09-09 NOTE — Progress Notes (Signed)
Subjective:    HPI   He fell at work on 06/08/13 - LBP - workman's comp  Hydrocodone, hydromorphone - not helping  S/p R THR 4/15 - better C/o L hip pain - needs THR 8/10   Back Pain  This is a chronic problem. The current episode started more than 1 year ago. The pain is present in the lumbar spine and gluteal. The quality of the pain is described as aching and burning. The pain is at a severity of 7/10. Hysingla helped. The pain is severe. The symptoms are aggravated by bending, position, standing and twisting. Stiffness is present all day. Pertinent negatives include no chest pain or weakness. He has tried analgesics and muscle relaxant for the symptoms. The treatment provided mild relief.  Norco is not helping  He took pain meds, clonazepam last on 4/20  The patient presents for a follow-up of hypogonadism, chronic hypertension, chronic dyslipidemia, type 2 diabetes controlled with medicines. C/o ED  F/u being depressed. Seeing a therapist...    It is very hard for him to work due to hip pain    Wt Readings from Last 3 Encounters:  09/09/13 229 lb (103.874 kg)  08/16/13 222 lb 3.6 oz (100.8 kg)  08/16/13 222 lb 3.6 oz (100.8 kg)   BP Readings from Last 3 Encounters:  09/09/13 142/80  08/17/13 101/56  08/17/13 101/56       Review of Systems  Constitutional: Negative for appetite change, fatigue and unexpected weight change.  HENT: Negative for congestion, nosebleeds, sneezing, sore throat and trouble swallowing.   Eyes: Negative for itching and visual disturbance.  Respiratory: Negative for cough.   Cardiovascular: Negative for chest pain, palpitations and leg swelling.  Gastrointestinal: Negative for nausea, diarrhea, blood in stool and abdominal distention.  Genitourinary: Negative for frequency and hematuria.  Musculoskeletal: Positive for arthralgias, back pain and gait problem. Negative for joint swelling and neck pain.  Skin: Negative for rash.   Neurological: Negative for dizziness, tremors, speech difficulty and weakness.  Psychiatric/Behavioral: Positive for sleep disturbance. Negative for suicidal ideas, dysphoric mood and agitation. The patient is nervous/anxious.        Objective:   Physical Exam  Constitutional: He is oriented to person, place, and time. He appears well-developed. No distress.  HENT:  Mouth/Throat: Oropharynx is clear and moist.  Eyes: Conjunctivae are normal. Pupils are equal, round, and reactive to light.  Neck: Normal range of motion. No JVD present. No thyromegaly present.  Cardiovascular: Normal rate, regular rhythm, normal heart sounds and intact distal pulses.  Exam reveals no gallop and no friction rub.   No murmur heard. Pulmonary/Chest: Effort normal and breath sounds normal. No respiratory distress. He has no wheezes. He has no rales. He exhibits no tenderness.  Abdominal: Soft. Bowel sounds are normal. He exhibits no distension and no mass. There is no tenderness. There is no rebound and no guarding.  Musculoskeletal: Normal range of motion. He exhibits tenderness (R hip, LS pine). He exhibits no edema.  Lymphadenopathy:    He has no cervical adenopathy.  Neurological: He is alert and oriented to person, place, and time. He has normal reflexes. No cranial nerve deficit. He exhibits normal muscle tone. Coordination normal.  Skin: Skin is warm and dry. No rash noted.  Psychiatric: His behavior is normal. Judgment and thought content normal.  tearful    Lab Results  Component Value Date   WBC 5.8 08/18/2013   HGB 8.8* 08/18/2013   HCT 25.6* 08/18/2013  PLT PLATELET CLUMPS NOTED ON SMEAR, COUNT APPEARS DECREASED 08/18/2013   GLUCOSE 198* 08/17/2013   CHOL 104 11/02/2012   TRIG 258.0* 01/18/2010   HDL 31.00* 01/18/2010   LDLDIRECT 117.3 01/18/2010   LDLCALC 84 12/11/2006   ALT 23 10/31/2012   AST 23 10/31/2012   NA 140 08/17/2013   K 5.1 08/17/2013   CL 102 08/17/2013   CREATININE 1.37* 08/17/2013    BUN 28* 08/17/2013   CO2 25 08/17/2013   TSH 3.579 11/01/2012   PSA 1.41 10/30/2010   INR 0.95 08/09/2013   HGBA1C 6.0* 11/01/2012         Assessment & Plan:

## 2013-09-09 NOTE — Assessment & Plan Note (Signed)
4/15 R THR - better L hip OA LBP  Continue with current prescription therapy as reflected on the Med list.

## 2013-09-09 NOTE — Assessment & Plan Note (Signed)
F/u w/Dr Vertell Limber Continue with current prescription therapy as reflected on the Med list.

## 2013-09-09 NOTE — Assessment & Plan Note (Signed)
Needs to d/c

## 2013-09-09 NOTE — Assessment & Plan Note (Signed)
4/15 R THR - better L hip OA - not better. Needs THR   Continue with current prescription therapy as reflected on the Med list.

## 2013-09-13 NOTE — Telephone Encounter (Signed)
Left mess for patient to call back to see if pt was given all this at his 09/09/13 OV.

## 2013-10-14 ENCOUNTER — Telehealth: Payer: Self-pay | Admitting: Internal Medicine

## 2013-10-14 ENCOUNTER — Encounter: Payer: Self-pay | Admitting: Internal Medicine

## 2013-10-14 NOTE — Telephone Encounter (Signed)
Patient is requesting ambien CR or belsomra samples.  He states he will loose his insurance on 10/18/2013.  He does have an appt next month for 2 month FU.  Patient is requesting a fu call or email in response.  Patients also states he is not getting any sleep at night.  [facility] uses [facility] in [place].

## 2013-10-14 NOTE — Telephone Encounter (Signed)
We do not have samples of either. He needs to contact his psychiatrist re: sleep Thx

## 2013-10-16 ENCOUNTER — Other Ambulatory Visit: Payer: Self-pay | Admitting: Internal Medicine

## 2013-10-16 MED ORDER — ZOLPIDEM TARTRATE ER 12.5 MG PO TBCR: 12.5000 mg | EXTENDED_RELEASE_TABLET | Freq: Every evening | ORAL | Status: DC | PRN

## 2013-10-17 NOTE — Telephone Encounter (Signed)
Left detailed mess informing pt of below.  

## 2013-10-18 ENCOUNTER — Encounter: Payer: Self-pay | Admitting: Internal Medicine

## 2013-10-24 ENCOUNTER — Telehealth: Payer: Self-pay | Admitting: *Deleted

## 2013-10-24 DIAGNOSIS — E119 Type 2 diabetes mellitus without complications: Secondary | ICD-10-CM

## 2013-10-24 NOTE — Telephone Encounter (Signed)
Lipid panel added. Diabetic bundle.

## 2013-11-11 ENCOUNTER — Ambulatory Visit (INDEPENDENT_AMBULATORY_CARE_PROVIDER_SITE_OTHER): Payer: BC Managed Care – PPO | Admitting: Internal Medicine

## 2013-11-11 ENCOUNTER — Encounter: Payer: Self-pay | Admitting: Internal Medicine

## 2013-11-11 VITALS — BP 190/102 | HR 72 | Temp 98.8°F | Resp 16 | Wt 237.0 lb

## 2013-11-11 DIAGNOSIS — I10 Essential (primary) hypertension: Secondary | ICD-10-CM

## 2013-11-11 DIAGNOSIS — M545 Low back pain, unspecified: Secondary | ICD-10-CM

## 2013-11-11 DIAGNOSIS — E291 Testicular hypofunction: Secondary | ICD-10-CM

## 2013-11-11 DIAGNOSIS — G4700 Insomnia, unspecified: Secondary | ICD-10-CM

## 2013-11-11 DIAGNOSIS — M25551 Pain in right hip: Secondary | ICD-10-CM

## 2013-11-11 DIAGNOSIS — F411 Generalized anxiety disorder: Secondary | ICD-10-CM

## 2013-11-11 DIAGNOSIS — M25559 Pain in unspecified hip: Secondary | ICD-10-CM

## 2013-11-11 MED ORDER — CLONAZEPAM 2 MG PO TBDP: ORAL_TABLET | ORAL | Status: DC

## 2013-11-11 MED ORDER — HYDROCODONE BITARTRATE ER 60 MG PO T24A: 60.0000 mg | EXTENDED_RELEASE_TABLET | Freq: Every day | ORAL | Status: DC

## 2013-11-11 MED ORDER — SUVOREXANT 15 MG PO TABS
15.0000 mg | ORAL_TABLET | Freq: Every evening | ORAL | Status: DC | PRN
Start: 2013-11-11 — End: 2013-11-14

## 2013-11-11 NOTE — Assessment & Plan Note (Signed)
Continue with current prescription therapy as reflected on the Med list.  

## 2013-11-11 NOTE — Progress Notes (Signed)
Subjective:    HPI   He fell at work on 06/08/13 - LBP - workman's comp  Hydrocodone, hydromorphone - not helping  S/p R THR 4/15 - better C/o L hip pain - needs THR 8/10   Back Pain  This is a chronic problem. The current episode started more than 1 year ago. The pain is present in the lumbar spine and gluteal. The quality of the pain is described as aching and burning. The pain is at a severity of 7/10. Hysingla helped. The pain is severe. The symptoms are aggravated by bending, position, standing and twisting. Stiffness is present all day. Pertinent negatives include no chest pain or weakness. He has tried analgesics and muscle relaxant for the symptoms. The treatment provided mild relief.  Norco is not helping  He took pain meds, clonazepam last on 4/20  The patient presents for a follow-up of hypogonadism, chronic hypertension, chronic dyslipidemia, type 2 diabetes controlled with medicines. C/o ED  F/u being depressed. Seeing a therapist...    It is very hard for him to work due to hip pain    Wt Readings from Last 3 Encounters:  11/11/13 237 lb (107.502 kg)  09/09/13 229 lb (103.874 kg)  08/16/13 222 lb 3.6 oz (100.8 kg)   BP Readings from Last 3 Encounters:  11/11/13 190/102  09/09/13 142/80  08/17/13 101/56       Review of Systems  Constitutional: Negative for appetite change, fatigue and unexpected weight change.  HENT: Negative for congestion, nosebleeds, sneezing, sore throat and trouble swallowing.   Eyes: Negative for itching and visual disturbance.  Respiratory: Negative for cough.   Cardiovascular: Negative for chest pain, palpitations and leg swelling.  Gastrointestinal: Negative for nausea, diarrhea, blood in stool and abdominal distention.  Genitourinary: Negative for frequency and hematuria.  Musculoskeletal: Positive for arthralgias, back pain and gait problem. Negative for joint swelling and neck pain.  Skin: Negative for rash.  Neurological:  Negative for dizziness, tremors, speech difficulty and weakness.  Psychiatric/Behavioral: Positive for sleep disturbance. Negative for suicidal ideas, dysphoric mood and agitation. The patient is nervous/anxious.        Objective:   Physical Exam  Constitutional: He is oriented to person, place, and time. He appears well-developed. No distress.  HENT:  Mouth/Throat: Oropharynx is clear and moist.  Eyes: Conjunctivae are normal. Pupils are equal, round, and reactive to light.  Neck: Normal range of motion. No JVD present. No thyromegaly present.  Cardiovascular: Normal rate, regular rhythm, normal heart sounds and intact distal pulses.  Exam reveals no gallop and no friction rub.   No murmur heard. Pulmonary/Chest: Effort normal and breath sounds normal. No respiratory distress. He has no wheezes. He has no rales. He exhibits no tenderness.  Abdominal: Soft. Bowel sounds are normal. He exhibits no distension and no mass. There is no tenderness. There is no rebound and no guarding.  Musculoskeletal: Normal range of motion. He exhibits tenderness (R hip, LS pine). He exhibits no edema.  Lymphadenopathy:    He has no cervical adenopathy.  Neurological: He is alert and oriented to person, place, and time. He has normal reflexes. No cranial nerve deficit. He exhibits normal muscle tone. Coordination normal.  Skin: Skin is warm and dry. No rash noted.  Psychiatric: His behavior is normal. Judgment and thought content normal.  tearful    Lab Results  Component Value Date   WBC 5.8 08/18/2013   HGB 8.8* 08/18/2013   HCT 25.6* 08/18/2013  PLT PLATELET CLUMPS NOTED ON SMEAR, COUNT APPEARS DECREASED 08/18/2013   GLUCOSE 198* 08/17/2013   CHOL 104 11/02/2012   TRIG 258.0* 01/18/2010   HDL 31.00* 01/18/2010   LDLDIRECT 117.3 01/18/2010   LDLCALC 84 12/11/2006   ALT 23 10/31/2012   AST 23 10/31/2012   NA 140 08/17/2013   K 5.1 08/17/2013   CL 102 08/17/2013   CREATININE 1.37* 08/17/2013   BUN 28*  08/17/2013   CO2 25 08/17/2013   TSH 3.579 11/01/2012   PSA 1.41 10/30/2010   INR 0.95 08/09/2013   HGBA1C 6.0* 11/01/2012         Assessment & Plan:

## 2013-11-11 NOTE — Progress Notes (Signed)
Pre visit review using our clinic review tool, if applicable. No additional management support is needed unless otherwise documented below in the visit note. 

## 2013-11-11 NOTE — Assessment & Plan Note (Signed)
7/15 refractory We can try Belsomra w/caution

## 2013-11-14 ENCOUNTER — Other Ambulatory Visit: Payer: Self-pay

## 2013-11-14 MED ORDER — SUVOREXANT 15 MG PO TABS: 15.0000 mg | ORAL_TABLET | Freq: Every evening | ORAL | Status: DC | PRN

## 2013-11-17 ENCOUNTER — Ambulatory Visit: Payer: BC Managed Care – PPO | Admitting: *Deleted

## 2013-11-17 DIAGNOSIS — E119 Type 2 diabetes mellitus without complications: Secondary | ICD-10-CM

## 2013-11-17 DIAGNOSIS — M25551 Pain in right hip: Secondary | ICD-10-CM

## 2013-11-17 DIAGNOSIS — F411 Generalized anxiety disorder: Secondary | ICD-10-CM

## 2013-11-17 DIAGNOSIS — M545 Low back pain, unspecified: Secondary | ICD-10-CM

## 2013-11-17 DIAGNOSIS — G4700 Insomnia, unspecified: Secondary | ICD-10-CM

## 2013-11-17 DIAGNOSIS — I10 Essential (primary) hypertension: Secondary | ICD-10-CM

## 2013-11-17 DIAGNOSIS — E291 Testicular hypofunction: Secondary | ICD-10-CM

## 2013-11-17 LAB — BASIC METABOLIC PANEL
BUN: 19 mg/dL (ref 6–23)
CALCIUM: 8.9 mg/dL (ref 8.4–10.5)
CO2: 26 meq/L (ref 19–32)
CREATININE: 1.3 mg/dL (ref 0.4–1.5)
Chloride: 106 mEq/L (ref 96–112)
GFR: 61.54 mL/min (ref >60.00)
Glucose, Bld: 306 mg/dL — ABNORMAL HIGH (ref 70–99)
Potassium: 4 mEq/L (ref 3.5–5.1)
SODIUM: 140 meq/L (ref 135–145)

## 2013-11-17 LAB — URINALYSIS, ROUTINE W REFLEX MICROSCOPIC
Bilirubin Urine: NEGATIVE
KETONES UR: NEGATIVE
Leukocytes, UA: NEGATIVE
Nitrite: NEGATIVE
Specific Gravity, Urine: >=1.03 — AB (ref 1.000–1.030)
UROBILINOGEN UA: 0.2 (ref 0.0–1.0)
Urine Glucose: >=1000 — AB
pH: 6 (ref 5.0–8.0)

## 2013-11-17 LAB — CBC WITH DIFFERENTIAL/PLATELET
BASOS PCT: 0.3 % (ref 0.0–3.0)
Basophils Absolute: 0 10*3/uL (ref 0.0–0.1)
EOS PCT: 1.9 % (ref 0.0–5.0)
Eosinophils Absolute: 0.1 10*3/uL (ref 0.0–0.7)
HCT: 40.2 % (ref 39.0–52.0)
HEMOGLOBIN: 13.7 g/dL (ref 13.0–17.0)
LYMPHS PCT: 40.6 % (ref 12.0–46.0)
Lymphs Abs: 2.9 10*3/uL (ref 0.7–4.0)
MCHC: 34.2 g/dL (ref 30.0–36.0)
MCV: 92.3 fl (ref 78.0–100.0)
MONOS PCT: 6.5 % (ref 3.0–12.0)
Monocytes Absolute: 0.5 10*3/uL (ref 0.1–1.0)
NEUTROS ABS: 3.6 10*3/uL (ref 1.4–7.7)
NEUTROS PCT: 50.7 % (ref 43.0–77.0)
Platelets: 141 10*3/uL — ABNORMAL LOW (ref 150.0–400.0)
RBC: 4.35 Mil/uL (ref 4.22–5.81)
RDW: 14.4 % (ref 11.5–15.5)
WBC: 7.2 10*3/uL (ref 4.0–10.5)

## 2013-11-17 LAB — HEPATIC FUNCTION PANEL
ALBUMIN: 4.1 g/dL (ref 3.5–5.2)
ALT: 20 U/L (ref 0–53)
AST: 21 U/L (ref 0–37)
Alkaline Phosphatase: 69 U/L (ref 39–117)
Bilirubin, Direct: 0 mg/dL (ref 0.0–0.3)
TOTAL PROTEIN: 7 g/dL (ref 6.0–8.3)
Total Bilirubin: 0.2 mg/dL (ref 0.2–1.2)

## 2013-11-17 LAB — LIPID PANEL
CHOL/HDL RATIO: 8
Cholesterol: 212 mg/dL — ABNORMAL HIGH (ref 0–200)
HDL: 27.9 mg/dL — AB (ref >39.00)
NONHDL: 184.1
TRIGLYCERIDES: 841 mg/dL — AB (ref 0.0–149.0)
VLDL: 168.2 mg/dL — ABNORMAL HIGH (ref 0.0–40.0)

## 2013-11-17 LAB — IBC PANEL
Iron: 134 ug/dL (ref 42–165)
SATURATION RATIOS: 37.1 % (ref 20.0–50.0)
TRANSFERRIN: 258.1 mg/dL (ref 212.0–360.0)

## 2013-11-17 LAB — TSH: TSH: 3.24 u[IU]/mL (ref 0.35–4.50)

## 2013-11-17 LAB — LDL CHOLESTEROL, DIRECT: LDL DIRECT: 92.5 mg/dL

## 2013-11-17 LAB — SEDIMENTATION RATE: Sed Rate: 6 mm/hr (ref 0–22)

## 2013-11-21 ENCOUNTER — Telehealth: Payer: Self-pay | Admitting: Internal Medicine

## 2013-11-21 DIAGNOSIS — Z202 Contact with and (suspected) exposure to infections with a predominantly sexual mode of transmission: Secondary | ICD-10-CM

## 2013-11-21 NOTE — Telephone Encounter (Signed)
Ok HIV test Thx

## 2013-11-21 NOTE — Telephone Encounter (Signed)
Patient is coming in in the morning for lab work.  He is requesting an order for HIV testing to be added to that.

## 2013-11-22 ENCOUNTER — Other Ambulatory Visit (INDEPENDENT_AMBULATORY_CARE_PROVIDER_SITE_OTHER): Payer: BC Managed Care – PPO

## 2013-11-22 DIAGNOSIS — Z202 Contact with and (suspected) exposure to infections with a predominantly sexual mode of transmission: Secondary | ICD-10-CM

## 2013-11-22 DIAGNOSIS — E119 Type 2 diabetes mellitus without complications: Secondary | ICD-10-CM

## 2013-11-22 LAB — LIPID PANEL
Cholesterol: 212 mg/dL — ABNORMAL HIGH (ref 0–200)
HDL: 27.7 mg/dL — AB (ref >39.00)
NonHDL: 184.3
Total CHOL/HDL Ratio: 8
Triglycerides: 621 mg/dL — ABNORMAL HIGH (ref 0.0–149.0)
VLDL: 124.2 mg/dL — ABNORMAL HIGH (ref 0.0–40.0)

## 2013-11-22 LAB — BASIC METABOLIC PANEL
BUN: 26 mg/dL — ABNORMAL HIGH (ref 6–23)
CALCIUM: 9.2 mg/dL (ref 8.4–10.5)
CO2: 30 meq/L (ref 19–32)
CREATININE: 1.5 mg/dL (ref 0.4–1.5)
Chloride: 106 mEq/L (ref 96–112)
GFR: 52.75 mL/min — ABNORMAL LOW (ref >60.00)
Glucose, Bld: 98 mg/dL (ref 70–99)
Potassium: 4.1 mEq/L (ref 3.5–5.1)
Sodium: 142 mEq/L (ref 135–145)

## 2013-11-22 LAB — LDL CHOLESTEROL, DIRECT: Direct LDL: 112.6 mg/dL

## 2013-11-22 LAB — HEMOGLOBIN A1C: HEMOGLOBIN A1C: 6.9 % — AB (ref 4.6–6.5)

## 2013-11-23 ENCOUNTER — Other Ambulatory Visit: Payer: Self-pay | Admitting: Internal Medicine

## 2013-11-23 ENCOUNTER — Telehealth: Payer: Self-pay | Admitting: *Deleted

## 2013-11-23 ENCOUNTER — Telehealth: Payer: Self-pay | Admitting: Internal Medicine

## 2013-11-23 LAB — HIV ANTIBODY (ROUTINE TESTING W REFLEX): HIV 1&2 Ab, 4th Generation: NONREACTIVE

## 2013-11-23 MED ORDER — METFORMIN HCL 500 MG PO TABS: 500.0000 mg | ORAL_TABLET | Freq: Every day | ORAL | Status: DC

## 2013-11-23 MED ORDER — TESTOSTERONE 20.25 MG/1.25GM (1.62%) TD GEL: 3.0000 "application " | Freq: Every day | TRANSDERMAL | Status: DC

## 2013-11-23 NOTE — Telephone Encounter (Signed)
Faxed rx to fax # below...[name]

## 2013-11-23 NOTE — Telephone Encounter (Signed)
Left smg on triage stating they have received an application for pt to get aiisitance won his andro gel. Needing rx fax to [phone]. If needing to speak with rep when calling back pls use ref# [id number]...Johny Chess

## 2013-11-23 NOTE — Telephone Encounter (Signed)
Received call from pharmacist insurance will not cover the [place], but it will cover generic Ambien CR. Is it ok to change...[name]

## 2013-11-23 NOTE — Telephone Encounter (Signed)
Patient states that [facility] has been sending PA form for his Suvorexant (BELSOMRA) 15 MG medication but has not yet gotten a reply. He wants to know the status. Please advise.

## 2013-11-24 ENCOUNTER — Other Ambulatory Visit: Payer: Self-pay

## 2013-11-24 NOTE — Telephone Encounter (Signed)
Received fax from [place] stating that pt's insurance plan does not cover belsomra 15 mg tabs 1 tab po at bedtime prn. Plan does cover ambien CR. Please advise

## 2013-11-24 NOTE — Telephone Encounter (Signed)
Ok Thx 

## 2013-11-25 MED ORDER — ZOLPIDEM TARTRATE ER 12.5 MG PO TBCR: 12.5000 mg | EXTENDED_RELEASE_TABLET | Freq: Every evening | ORAL | Status: DC | PRN

## 2013-11-25 NOTE — Telephone Encounter (Signed)
Done

## 2013-11-25 NOTE — Telephone Encounter (Signed)
Called ambien into pharmacy spoke with margaret gave md authorization...Johny Chess

## 2013-11-28 ENCOUNTER — Other Ambulatory Visit: Payer: Self-pay | Admitting: *Deleted

## 2013-11-28 MED ORDER — TESTOSTERONE 20.25 MG/1.25GM (1.62%) TD GEL: 3.0000 "application " | Freq: Every day | TRANSDERMAL | Status: DC

## 2014-01-06 ENCOUNTER — Telehealth: Payer: Self-pay | Admitting: Internal Medicine

## 2014-01-06 NOTE — Telephone Encounter (Signed)
Pt called in requesting call back from Nurse about his pt assistance.  He had a few question about meds and pt assistance issued.    Best number [phone]  Thank you!!

## 2014-01-09 NOTE — Telephone Encounter (Signed)
Left mess for patient to call back.  

## 2014-01-13 ENCOUNTER — Other Ambulatory Visit: Payer: Self-pay | Admitting: *Deleted

## 2014-01-13 MED ORDER — TESTOSTERONE 20.25 MG/1.25GM (1.62%) TD GEL: 3.0000 "application " | Freq: Every day | TRANSDERMAL | Status: DC

## 2014-01-27 ENCOUNTER — Telehealth: Payer: Self-pay | Admitting: Internal Medicine

## 2014-01-27 NOTE — Telephone Encounter (Signed)
Patient states [employer] patient assistance program has not received script for cialis.   [employer] assistance program has not received script for androgel.   [place] assistance program has not received script for belsomra (must be mailed). Patient is requesting a call in regards.

## 2014-02-01 ENCOUNTER — Telehealth: Payer: Self-pay | Admitting: Internal Medicine

## 2014-02-01 NOTE — Telephone Encounter (Signed)
error 

## 2014-02-01 NOTE — Telephone Encounter (Signed)
Patient called back today in regards to this

## 2014-02-06 MED ORDER — TESTOSTERONE 20.25 MG/1.25GM (1.62%) TD GEL: 3.0000 "application " | Freq: Every day | TRANSDERMAL | Status: DC

## 2014-02-06 MED ORDER — TADALAFIL 5 MG PO TABS: 5.0000 mg | ORAL_TABLET | Freq: Every day | ORAL | Status: DC

## 2014-02-06 NOTE — Telephone Encounter (Signed)
Androgel and Cialis Rxs faxed to numbers below.

## 2014-02-06 NOTE — Telephone Encounter (Signed)
  [phone]  Fax for Cialis [phone] Fax for Androgel   Hold of on [place] per pt until 02/13/14 OV  [address] [place], [place] address for [employer]

## 2014-02-08 ENCOUNTER — Other Ambulatory Visit: Payer: Self-pay | Admitting: *Deleted

## 2014-02-08 MED ORDER — HYDROCODONE BITARTRATE ER 60 MG PO T24A: 60.0000 mg | EXTENDED_RELEASE_TABLET | Freq: Every day | ORAL | Status: DC

## 2014-02-13 ENCOUNTER — Ambulatory Visit (INDEPENDENT_AMBULATORY_CARE_PROVIDER_SITE_OTHER): Payer: BC Managed Care – PPO | Admitting: Internal Medicine

## 2014-02-13 ENCOUNTER — Encounter: Payer: Self-pay | Admitting: Internal Medicine

## 2014-02-13 VITALS — BP 140/100 | HR 100 | Temp 98.4°F | Resp 16 | Wt 248.0 lb

## 2014-02-13 DIAGNOSIS — M25552 Pain in left hip: Secondary | ICD-10-CM | POA: Insufficient documentation

## 2014-02-13 DIAGNOSIS — M5440 Lumbago with sciatica, unspecified side: Secondary | ICD-10-CM

## 2014-02-13 DIAGNOSIS — E119 Type 2 diabetes mellitus without complications: Secondary | ICD-10-CM

## 2014-02-13 DIAGNOSIS — I10 Essential (primary) hypertension: Secondary | ICD-10-CM

## 2014-02-13 DIAGNOSIS — M1611 Unilateral primary osteoarthritis, right hip: Secondary | ICD-10-CM

## 2014-02-13 DIAGNOSIS — F3130 Bipolar disorder, current episode depressed, mild or moderate severity, unspecified: Secondary | ICD-10-CM

## 2014-02-13 MED ORDER — CLONAZEPAM 2 MG PO TABS: 2.0000 mg | ORAL_TABLET | Freq: Three times a day (TID) | ORAL | Status: DC | PRN

## 2014-02-13 MED ORDER — LURASIDONE HCL 20 MG PO TABS: ORAL_TABLET | ORAL | Status: DC

## 2014-02-13 MED ORDER — CLONAZEPAM 2 MG PO TBDP: ORAL_TABLET | ORAL | Status: DC

## 2014-02-13 NOTE — Assessment & Plan Note (Signed)
Continue with current prescription therapy as reflected on the Med list.  

## 2014-02-13 NOTE — Progress Notes (Signed)
Subjective:    HPI   He fell at work on 06/08/13 - LBP - workman's comp  Hydrocodone, hydromorphone - not helping  S/p R THR 4/15 - better C/o L hip pain - needs THR in the future   Back Pain   This is a chronic problem. The current episode started more than 1 year ago. The pain is present in the lumbar spine and gluteal. The quality of the pain is described as aching and burning. The pain is at a severity of 7/10. Hysingla helped. The pain is severe. The symptoms are aggravated by bending, position, standing and twisting. Stiffness is present all day. Pertinent negatives include no chest pain or weakness. He has tried analgesics and muscle relaxant for the symptoms. The treatment provided mild relief.  Norco is not helping  He took pain meds, clonazepam last on 4/20  The patient presents for a follow-up of hypogonadism, chronic hypertension, chronic dyslipidemia, type 2 diabetes controlled with medicines. C/o ED  F/u being depressed. Seeing a therapist...    It is very hard for him to work due to hip pain    Wt Readings from Last 3 Encounters:  02/13/14 248 lb (112.492 kg)  11/11/13 237 lb (107.502 kg)  09/09/13 229 lb (103.874 kg)   BP Readings from Last 3 Encounters:  02/13/14 140/100  11/11/13 190/102  09/09/13 142/80       Review of Systems  Constitutional: Negative for appetite change, fatigue and unexpected weight change.  HENT: Negative for congestion, nosebleeds, sneezing, sore throat and trouble swallowing.   Eyes: Negative for itching and visual disturbance.  Respiratory: Negative for cough.   Cardiovascular: Negative for chest pain, palpitations and leg swelling.  Gastrointestinal: Negative for nausea, diarrhea, blood in stool and abdominal distention.  Genitourinary: Negative for frequency and hematuria.  Musculoskeletal: Positive for arthralgias, back pain and gait problem. Negative for joint swelling and neck pain.  Skin: Negative for rash.   Neurological: Negative for dizziness, tremors, speech difficulty and weakness.  Psychiatric/Behavioral: Negative for suicidal ideas, sleep disturbance, dysphoric mood and agitation. The patient is not nervous/anxious.        Objective:   Physical Exam  Constitutional: He is oriented to person, place, and time. He appears well-developed. No distress.  HENT:  Mouth/Throat: Oropharynx is clear and moist.  Eyes: Conjunctivae are normal. Pupils are equal, round, and reactive to light.  Neck: Normal range of motion. No JVD present. No thyromegaly present.  Cardiovascular: Normal rate, regular rhythm, normal heart sounds and intact distal pulses.  Exam reveals no gallop and no friction rub.   No murmur heard. Pulmonary/Chest: Effort normal and breath sounds normal. No respiratory distress. He has no wheezes. He has no rales. He exhibits no tenderness.  Abdominal: Soft. Bowel sounds are normal. He exhibits no distension and no mass. There is no tenderness. There is no rebound and no guarding.  Musculoskeletal: Normal range of motion. He exhibits tenderness (R hip, LS pine). He exhibits no edema.  Limping LS is tender w/ROM L hip is tender w/ROM  Lymphadenopathy:    He has no cervical adenopathy.  Neurological: He is alert and oriented to person, place, and time. He has normal reflexes. No cranial nerve deficit. He exhibits normal muscle tone. Coordination normal.  Skin: Skin is warm and dry. No rash noted.  Psychiatric: His behavior is normal. Judgment and thought content normal.  Not tearful    Lab Results  Component Value Date   WBC 7.2 11/17/2013  HGB 13.7 11/17/2013   HCT 40.2 11/17/2013   PLT 141.0* 11/17/2013   GLUCOSE 98 11/22/2013   CHOL 212* 11/22/2013   TRIG 621.0 Triglyceride is over 400; calculations on Lipids are invalid.* 11/22/2013   HDL 27.70* 11/22/2013   LDLDIRECT 112.6 11/22/2013   LDLCALC 84 12/11/2006   ALT 20 11/17/2013   AST 21 11/17/2013   NA 142 11/22/2013   K 4.1 11/22/2013    CL 106 11/22/2013   CREATININE 1.5 11/22/2013   BUN 26* 11/22/2013   CO2 30 11/22/2013   TSH 3.24 11/17/2013   PSA 1.41 10/30/2010   INR 0.95 08/09/2013   HGBA1C 6.9* 11/22/2013         Assessment & Plan:

## 2014-02-13 NOTE — Progress Notes (Signed)
Pre visit review using our clinic review tool, if applicable. No additional management support is needed unless otherwise documented below in the visit note. 

## 2014-02-13 NOTE — Assessment & Plan Note (Signed)
Chronic - s/p L4-5 fusion Dr Vertell Limber  Potential benefits of a long term opioids use as well as potential risks (i.e. addiction risk, apnea etc) and complications (i.e. Somnolence, constipation and others) were explained to the patient and were aknowledged. Continue with current prescription therapy as reflected on the Med list. Applied for a long term disability

## 2014-02-13 NOTE — Assessment & Plan Note (Signed)
Better after THR

## 2014-02-13 NOTE — Assessment & Plan Note (Signed)
Labs

## 2014-02-13 NOTE — Assessment & Plan Note (Signed)
Dr Novella Olive Chronic pain

## 2014-02-14 ENCOUNTER — Other Ambulatory Visit: Payer: Self-pay | Admitting: *Deleted

## 2014-02-14 MED ORDER — SUVOREXANT 15 MG PO TABS: 15.0000 mg | ORAL_TABLET | Freq: Every evening | ORAL | Status: DC | PRN

## 2014-02-15 ENCOUNTER — Ambulatory Visit: Payer: Self-pay | Admitting: Internal Medicine

## 2014-03-01 ENCOUNTER — Telehealth: Payer: Self-pay | Admitting: Internal Medicine

## 2014-03-01 NOTE — Telephone Encounter (Signed)
Pt called to check up on pt  Assistant for Cialis. Please check

## 2014-03-01 NOTE — Telephone Encounter (Signed)
[name] called from [facility] pt assistant pharmacy request phone call from the assistant about pain medication that [name] is taking or getting. Please give her a call back, very important.

## 2014-03-03 NOTE — Telephone Encounter (Signed)
Cialis 5 mg 4 bottles of #30 received. I left detailed mess informing pt med is upfront for p/u.

## 2014-03-06 NOTE — Telephone Encounter (Signed)
Tried to get through to the pharmacist but was placed on hold for over 10 minutes.

## 2014-03-08 ENCOUNTER — Telehealth: Payer: Self-pay | Admitting: Internal Medicine

## 2014-03-08 NOTE — Telephone Encounter (Signed)
Pls inform pt that I'll not be able to prescribe controlled substances due to the fact that [place] was obtaining Percocet from another provider (it was a breach of Pain contract). Thx

## 2014-03-08 NOTE — Telephone Encounter (Signed)
[facility], Event organiser (pharmacist, called to make MD aware that  Patient is getting meds from multiple providers) 10/15 Percocet from [facility] ([place]), 9/16 Percocet from [facility] ([place]), 8/18 Percocet from [facility], 7/20 Percocet from [facility] ([place]), and getting meds from Dr [name] at the same time.  [phone]

## 2014-03-10 ENCOUNTER — Telehealth: Payer: Self-pay | Admitting: Internal Medicine

## 2014-03-10 ENCOUNTER — Other Ambulatory Visit: Payer: Self-pay

## 2014-03-10 NOTE — Telephone Encounter (Signed)
Called the Pharmacy. Spoke to Medication Software engineer. Wanted to let Dr. [name] know that pt is getting multiple rx of controled substance from different providers. I read one of the last phone notes and [facility] had called earlier and stated the same thing. The Med Assistance Representative stated that the pt will be dismissed from the medication assistance program and needed advise on what to do with the pain rx from 02/08/14. I advised to please destroy it. Representative will send a letter to the pt and a copy of that letter to this office. Rep also wanted to know if the pt was going to be dismissed.

## 2014-03-10 NOTE — Telephone Encounter (Signed)
Patient states Armed forces training and education officer (pharmacist)  is requesting call at [phone].

## 2014-03-10 NOTE — Telephone Encounter (Signed)
See 03/01/14 phone note.

## 2014-03-10 NOTE — Telephone Encounter (Signed)
LVM for pt to call back   RE: PCP has earlier note indicating that pt needed to be contacted and informed that PCP would no longer rx pain medication due to breach of Controlled Substance Contract.

## 2014-03-10 NOTE — Telephone Encounter (Signed)
[employer] calling back following to message left 11/11 regarding pain medications   [phone]

## 2014-03-13 NOTE — Telephone Encounter (Signed)
Pt informed

## 2014-03-25 ENCOUNTER — Encounter (HOSPITAL_COMMUNITY): Payer: Self-pay | Admitting: *Deleted

## 2014-03-25 ENCOUNTER — Emergency Department (HOSPITAL_COMMUNITY)
Admission: EM | Admit: 2014-03-25 | Discharge: 2014-03-25 | Disposition: A | Discharge status: Home / Self Care | Payer: BC Managed Care – PPO | Attending: Emergency Medicine | Admitting: Emergency Medicine

## 2014-03-25 DIAGNOSIS — Z87442 Personal history of urinary calculi: Secondary | ICD-10-CM | POA: Insufficient documentation

## 2014-03-25 DIAGNOSIS — K088 Other specified disorders of teeth and supporting structures: Secondary | ICD-10-CM | POA: Insufficient documentation

## 2014-03-25 DIAGNOSIS — Z8639 Personal history of other endocrine, nutritional and metabolic disease: Secondary | ICD-10-CM | POA: Insufficient documentation

## 2014-03-25 DIAGNOSIS — Z8701 Personal history of pneumonia (recurrent): Secondary | ICD-10-CM | POA: Insufficient documentation

## 2014-03-25 DIAGNOSIS — R319 Hematuria, unspecified: Secondary | ICD-10-CM

## 2014-03-25 DIAGNOSIS — Z79899 Other long term (current) drug therapy: Secondary | ICD-10-CM | POA: Insufficient documentation

## 2014-03-25 DIAGNOSIS — M5431 Sciatica, right side: Secondary | ICD-10-CM

## 2014-03-25 DIAGNOSIS — Z7982 Long term (current) use of aspirin: Secondary | ICD-10-CM | POA: Insufficient documentation

## 2014-03-25 DIAGNOSIS — M5441 Lumbago with sciatica, right side: Secondary | ICD-10-CM | POA: Insufficient documentation

## 2014-03-25 DIAGNOSIS — M1611 Unilateral primary osteoarthritis, right hip: Secondary | ICD-10-CM | POA: Insufficient documentation

## 2014-03-25 DIAGNOSIS — F319 Bipolar disorder, unspecified: Secondary | ICD-10-CM | POA: Insufficient documentation

## 2014-03-25 DIAGNOSIS — K029 Dental caries, unspecified: Secondary | ICD-10-CM

## 2014-03-25 DIAGNOSIS — Z9889 Other specified postprocedural states: Secondary | ICD-10-CM | POA: Insufficient documentation

## 2014-03-25 DIAGNOSIS — F419 Anxiety disorder, unspecified: Secondary | ICD-10-CM | POA: Insufficient documentation

## 2014-03-25 DIAGNOSIS — Z87891 Personal history of nicotine dependence: Secondary | ICD-10-CM | POA: Insufficient documentation

## 2014-03-25 LAB — URINALYSIS, ROUTINE W REFLEX MICROSCOPIC
BILIRUBIN URINE: NEGATIVE
Ketones, ur: 15 mg/dL — AB
LEUKOCYTES UA: NEGATIVE
Nitrite: NEGATIVE
PH: 6 (ref 5.0–8.0)
Protein, ur: 30 mg/dL — AB
Specific Gravity, Urine: 1.04 — ABNORMAL HIGH (ref 1.005–1.030)
Urobilinogen, UA: 0.2 mg/dL (ref 0.0–1.0)

## 2014-03-25 LAB — URINE MICROSCOPIC-ADD ON

## 2014-03-25 MED ORDER — PENICILLIN V POTASSIUM 500 MG PO TABS: 500.0000 mg | ORAL_TABLET | Freq: Three times a day (TID) | ORAL | Status: DC

## 2014-03-25 MED ORDER — METHOCARBAMOL 500 MG PO TABS: 500.0000 mg | ORAL_TABLET | Freq: Two times a day (BID) | ORAL | Status: DC

## 2014-03-25 MED ORDER — PENICILLIN V POTASSIUM 250 MG PO TABS
500.0000 mg | ORAL_TABLET | Freq: Once | ORAL | Status: AC
  Administered 2014-03-25: 500 mg via ORAL

## 2014-03-25 MED ORDER — OXYCODONE-ACETAMINOPHEN 5-325 MG PO TABS
1.0000 | ORAL_TABLET | Freq: Once | ORAL | Status: AC
  Administered 2014-03-25: 1 via ORAL

## 2014-03-25 MED ORDER — OXYCODONE-ACETAMINOPHEN 5-325 MG PO TABS: 2.0000 | ORAL_TABLET | ORAL | Status: DC | PRN

## 2014-03-25 NOTE — ED Provider Notes (Signed)
CSN: [id number]     Arrival date & time 03/25/14  1111 History   First MD Initiated Contact with Patient 03/25/14 1137     Chief Complaint  Patient presents with  . Back Pain  . Dental Pain     (Consider location/radiation/quality/duration/timing/severity/associated sxs/prior Treatment) HPI   48 year old male with history of chronic back pain from herniated disc, history of kidney stones, and anxiety and depression who presents complaining of back pain and dental pain. Patient reports he had back surgery in the past and has been doing well. However for the past week he has had recurrent low back pain. He described pain as a sharp shooting sensation radiates to the back of his right leg down to his foot. Pain especially worse with initiation of movement and usually improves after he moves around. Pain has gotten progressively worse and not improved with taking over-the-counter medication. He also complained that his urine is darker in color and having some trouble initiating stream.  No complaints of fever, chills, headache, abdominal pain, hematuria, bowel problem, new weakness or numbness. Has history of microscopic hematuria with prior urology evaluation that was deemed not related to cancer. No recent injury. Patient walks with a cane.  Patient also complaining of pain to his right lower tooth ongoing for the same duration. Pain is a sharp sensation worsening with chewing and with cold air. He has had similar pain to the same tooth in the past but did not follow-up with a dentist. He is currently unemployed and does not have the funding for dental care.  Past Medical History  Diagnosis Date  . Anxiety   . Depression     bipolar [facility]  . Hypogonadism male   . ADD (attention deficit disorder)   . Microscopic hematuria     hereditary s/p Urology eval  . Bipolar 1 disorder   . Arthritis     right hip  . Family history of anesthesia complication     pt is unsure , but pt father  may have been difficult to arouse   . Pneumonia 10-2012  . Kidney stones   . Blood in urine    Past Surgical History  Procedure Laterality Date  . Lumbar disc surgery    . Back surgery    . Closed reduction metacarpal with percutaneous pinning Right   . Tonsillectomy    . Total hip arthroplasty Right 08/16/2013    Procedure: TOTAL HIP ARTHROPLASTY ANTERIOR APPROACH;  Surgeon: Hessie Dibble, MD;  Location: [facility];  Service: Orthopedics;  Laterality: Right;   Family History  Problem Relation Age of Onset  . Diabetes Father   . Cancer Mother     died of melanoma with mets  . Cervical cancer Sister   . Diabetes Sister    History  Substance Use Topics  . Smoking status: Former Smoker -- 25 years    Types: Cigarettes  . Smokeless tobacco: Never Used  . Alcohol Use: No    Review of Systems  All other systems reviewed and are negative.     Allergies  Review of patient's allergies indicates no known allergies.  Home Medications   Prior to Admission medications   Medication Sig Start Date End Date Taking? Authorizing Provider  aspirin EC 325 MG EC tablet Take 1 tablet (325 mg total) by mouth 2 (two) times daily after a meal. 08/18/13   Larwance Sachs Nida, PA-C  buPROPion [facility] SR) 150 MG 12 hr tablet Take 150 mg by  mouth 3 (three) times daily.     Historical Provider, MD  cholecalciferol (VITAMIN D) 1000 UNITS tablet Take 1,000 Units by mouth every morning.     Historical Provider, MD  clonazePAM (KLONOPIN) 2 MG tablet Take 1 tablet (2 mg total) by mouth 3 (three) times daily as needed for anxiety. 02/13/14   Aleksei Plotnikov V, MD  cyanocobalamin 500 MCG tablet Take 500 mcg by mouth every morning.     Historical Provider, MD  divalproex (DEPAKOTE ER) 500 MG 24 hr tablet Take 1,000 mg by mouth at bedtime.    Historical Provider, MD  Glucos-MSM-C-Mn-Ginger-Willow (GLUCOSAMINE MSM COMPLEX PO) Take 2 capsules by mouth 2 (two) times daily.     Historical Provider, MD   Lurasidone HCl 20 MG TABS 1 po qd 02/13/14   Lew Dawes V, MD  metFORMIN (GLUCOPHAGE) 500 MG tablet Take 1 tablet (500 mg total) by mouth daily with breakfast. 11/23/13   [name], MD  methocarbamol (ROBAXIN) 500 MG tablet Take 1 tablet (500 mg total) by mouth every 6 (six) hours as needed for muscle spasms. 08/18/13   Larwance Sachs Nida, PA-C  mirtazapine (REMERON) 45 MG tablet Take 45 mg by mouth at bedtime.    Historical Provider, MD  Multiple Vitamin (MULTIVITAMIN WITH MINERALS) TABS Take 1 tablet by mouth every morning.    Historical Provider, MD  Suvorexant (BELSOMRA) 15 MG TABS Take 15 mg by mouth at bedtime as needed. 02/14/14   Aleksei Plotnikov V, MD  tadalafil (CIALIS) 5 MG tablet Take 1 tablet (5 mg total) by mouth daily. 02/06/14   Aleksei Plotnikov V, MD  Testosterone (ANDROGEL) 20.25 MG/1.25GM (1.62%) GEL Place 3 application onto the skin daily. 02/06/14   Aleksei Plotnikov V, MD   BP 165/100 mmHg  Pulse 101  Temp(Src) 97.8 F (36.6 C) (Oral)  Resp 20  SpO2 95% Physical Exam  Constitutional: He appears well-developed and well-nourished. No distress.  HENT:  Head: Atraumatic.  Mouth: Dental decay noted to tooth #30, tenderness to palpation but no obvious abscess amenable for drainage, no gingivitis, and no trismus.  Eyes: Conjunctivae are normal.  Neck: Normal range of motion. Neck supple.  Cardiovascular: Normal rate and regular rhythm.   Pulmonary/Chest: Effort normal and breath sounds normal.  Abdominal: Soft. There is no tenderness.  Genitourinary:  Normal rectal tone on digital rectal exam.  Musculoskeletal: He exhibits tenderness (Tenderness to lumbar and right paralumbar spinal muscle on palpation. Positive straight leg raise to the right. Patellar deep tendon reflexes intact bilaterally, no foot drop. Patient admitted with a cane.).  Lymphadenopathy:    He has no cervical adenopathy.  Neurological: He is alert.  Skin: No rash noted.  Psychiatric:  He has a normal mood and affect.    ED Course  Procedures (including critical care time)  11:59 AM Patient here with radicular low back pain suggestive of sciatica. No red flags. Pain medication given. Patient is able to ambulate. Patient also complaining of change in urine color and having trouble initiating urine stream. I will check UA to rule out urinary tract infection.  1:41 PM Urine with moderate hemoglobin on urine dipstick but no evidence to suggest UTI. Patient does have prior history of kidney stones but this pain is different from his usual kidney stone pain. Given the fact that there is a radicular component to it we anticipate that this is likely to be a sciatica. Pt and I agree advance imaging not indicated at this time.  Plan  to treat patient with pain medication, muscle relaxant and outpatient follow-up. Patient will also need to follow with a dentist for further management of his dental pain. Penicillin prescribed for dental pain. Return precautions discussed. Urology follow-up given as well. Patient voiced understanding and agrees with plan.  Labs Review Labs Reviewed  URINALYSIS, ROUTINE W REFLEX MICROSCOPIC - Abnormal; Notable for the following:    Specific Gravity, Urine 1.040 (*)    Glucose, UA >1000 (*)    Hgb urine dipstick MODERATE (*)    Ketones, ur 15 (*)    Protein, ur 30 (*)    All other components within normal limits  URINE MICROSCOPIC-ADD ON    Imaging Review No results found.   EKG Interpretation None      MDM   Final diagnoses:  Sciatica, right  Pain due to dental caries  Hematuria    BP 122/68 mmHg  Pulse 96  Temp(Src) 97.8 F (36.6 C) (Oral)  Resp 16  SpO2 99%      Domenic Moras, PA-C 03/25/14 [place], MD 03/25/14 1818

## 2014-03-25 NOTE — Discharge Instructions (Signed)
Your low back pain is likely related to sciatica.  Follow instructions below. Take pain medication as needed. Follow-up with your doctor for further care. He also had dental pain. Take antibiotic as prescribed for the full duration.  Follow-up with a dentist on Monday for further management. You have blood in your urine , discussed this finding with your doctor as well.  Sciatica with Rehab The sciatic nerve runs from the back down the leg and is responsible for sensation and control of the muscles in the back (posterior) side of the thigh, lower leg, and foot. Sciatica is a condition that is characterized by inflammation of this nerve.  SYMPTOMS   Signs of nerve damage, including numbness and/or weakness along the posterior side of the lower extremity.  Pain in the back of the thigh that may also travel down the leg.  Pain that worsens when sitting for long periods of time.  Occasionally, pain in the back or buttock. CAUSES  Inflammation of the sciatic nerve is the cause of sciatica. The inflammation is due to something irritating the nerve. Common sources of irritation include:  Sitting for long periods of time.  Direct trauma to the nerve.  Arthritis of the spine.  Herniated or ruptured disk.  Slipping of the vertebrae (spondylolisthesis).  Pressure from soft tissues, such as muscles or ligament-like tissue (fascia). RISK INCREASES WITH:  Sports that place pressure or stress on the spine (football or weightlifting).  Poor strength and flexibility.  Failure to warm up properly before activity.  Family history of low back pain or disk disorders.  Previous back injury or surgery.  Poor body mechanics, especially when lifting, or poor posture. PREVENTION   Warm up and stretch properly before activity.  Maintain physical fitness:  Strength, flexibility, and endurance.  Cardiovascular fitness.  Learn and use proper technique, especially with posture and lifting. When  possible, have coach correct improper technique.  Avoid activities that place stress on the spine. PROGNOSIS If treated properly, then sciatica usually resolves within 6 weeks. However, occasionally surgery is necessary.  RELATED COMPLICATIONS   Permanent nerve damage, including pain, numbness, tingle, or weakness.  Chronic back pain.  Risks of surgery: infection, bleeding, nerve damage, or damage to surrounding tissues. TREATMENT Treatment initially involves resting from any activities that aggravate your symptoms. The use of ice and medication may help reduce pain and inflammation. The use of strengthening and stretching exercises may help reduce pain with activity. These exercises may be performed at home or with referral to a therapist. A therapist may recommend further treatments, such as transcutaneous electronic nerve stimulation (TENS) or ultrasound. Your caregiver may recommend corticosteroid injections to help reduce inflammation of the sciatic nerve. If symptoms persist despite non-surgical (conservative) treatment, then surgery may be recommended. MEDICATION  If pain medication is necessary, then nonsteroidal anti-inflammatory medications, such as aspirin and ibuprofen, or other minor pain relievers, such as acetaminophen, are often recommended.  Do not take pain medication for 7 days before surgery.  Prescription pain relievers may be given if deemed necessary by your caregiver. Use only as directed and only as much as you need.  Ointments applied to the skin may be helpful.  Corticosteroid injections may be given by your caregiver. These injections should be reserved for the most serious cases, because they may only be given a certain number of times. HEAT AND COLD  Cold treatment (icing) relieves pain and reduces inflammation. Cold treatment should be applied for 10 to 15 minutes every 2  to 3 hours for inflammation and pain and immediately after any activity that aggravates  your symptoms. Use ice packs or massage the area with a piece of ice (ice massage).  Heat treatment may be used prior to performing the stretching and strengthening activities prescribed by your caregiver, physical therapist, or athletic trainer. Use a heat pack or soak the injury in warm water. SEEK MEDICAL CARE IF:  Treatment seems to offer no benefit, or the condition worsens.  Any medications produce adverse side effects. EXERCISES  RANGE OF MOTION (ROM) AND STRETCHING EXERCISES - Sciatica Most people with sciatic will find that their symptoms worsen with either excessive bending forward (flexion) or arching at the low back (extension). The exercises which will help resolve your symptoms will focus on the opposite motion. Your physician, physical therapist or athletic trainer will help you determine which exercises will be most helpful to resolve your low back pain. Do not complete any exercises without first consulting with your clinician. Discontinue any exercises which worsen your symptoms until you speak to your clinician. If you have pain, numbness or tingling which travels down into your buttocks, leg or foot, the goal of the therapy is for these symptoms to move closer to your back and eventually resolve. Occasionally, these leg symptoms will get better, but your low back pain may worsen; this is typically an indication of progress in your rehabilitation. Be certain to be very alert to any changes in your symptoms and the activities in which you participated in the 24 hours prior to the change. Sharing this information with your clinician will allow him/her to most efficiently treat your condition. These exercises may help you when beginning to rehabilitate your injury. Your symptoms may resolve with or without further involvement from your physician, physical therapist or athletic trainer. While completing these exercises, remember:   Restoring tissue flexibility helps normal motion to  return to the joints. This allows healthier, less painful movement and activity.  An effective stretch should be held for at least 30 seconds.  A stretch should never be painful. You should only feel a gentle lengthening or release in the stretched tissue. FLEXION RANGE OF MOTION AND STRETCHING EXERCISES: STRETCH - Flexion, Single Knee to Chest   Lie on a firm bed or floor with both legs extended in front of you.  Keeping one leg in contact with the floor, bring your opposite knee to your chest. Hold your leg in place by either grabbing behind your thigh or at your knee.  Pull until you feel a gentle stretch in your low back. Hold __________ seconds.  Slowly release your grasp and repeat the exercise with the opposite side. Repeat __________ times. Complete this exercise __________ times per day.  STRETCH - Flexion, Double Knee to Chest  Lie on a firm bed or floor with both legs extended in front of you.  Keeping one leg in contact with the floor, bring your opposite knee to your chest.  Tense your stomach muscles to support your back and then lift your other knee to your chest. Hold your legs in place by either grabbing behind your thighs or at your knees.  Pull both knees toward your chest until you feel a gentle stretch in your low back. Hold __________ seconds.  Tense your stomach muscles and slowly return one leg at a time to the floor. Repeat __________ times. Complete this exercise __________ times per day.  STRETCH - Low Trunk Rotation   Lie on a firm  bed or floor. Keeping your legs in front of you, bend your knees so they are both pointed toward the ceiling and your feet are flat on the floor.  Extend your arms out to the side. This will stabilize your upper body by keeping your shoulders in contact with the floor.  Gently and slowly drop both knees together to one side until you feel a gentle stretch in your low back. Hold for __________ seconds.  Tense your stomach  muscles to support your low back as you bring your knees back to the starting position. Repeat the exercise to the other side. Repeat __________ times. Complete this exercise __________ times per day  EXTENSION RANGE OF MOTION AND FLEXIBILITY EXERCISES: STRETCH - Extension, Prone on Elbows  Lie on your stomach on the floor, a bed will be too soft. Place your palms about shoulder width apart and at the height of your head.  Place your elbows under your shoulders. If this is too painful, stack pillows under your chest.  Allow your body to relax so that your hips drop lower and make contact more completely with the floor.  Hold this position for __________ seconds.  Slowly return to lying flat on the floor. Repeat __________ times. Complete this exercise __________ times per day.  RANGE OF MOTION - Extension, Prone Press Ups  Lie on your stomach on the floor, a bed will be too soft. Place your palms about shoulder width apart and at the height of your head.  Keeping your back as relaxed as possible, slowly straighten your elbows while keeping your hips on the floor. You may adjust the placement of your hands to maximize your comfort. As you gain motion, your hands will come more underneath your shoulders.  Hold this position __________ seconds.  Slowly return to lying flat on the floor. Repeat __________ times. Complete this exercise __________ times per day.  STRENGTHENING EXERCISES - Sciatica  These exercises may help you when beginning to rehabilitate your injury. These exercises should be done near your "sweet spot." This is the neutral, low-back arch, somewhere between fully rounded and fully arched, that is your least painful position. When performed in this safe range of motion, these exercises can be used for people who have either a flexion or extension based injury. These exercises may resolve your symptoms with or without further involvement from your physician, physical therapist  or athletic trainer. While completing these exercises, remember:   Muscles can gain both the endurance and the strength needed for everyday activities through controlled exercises.  Complete these exercises as instructed by your physician, physical therapist or athletic trainer. Progress with the resistance and repetition exercises only as your caregiver advises.  You may experience muscle soreness or fatigue, but the pain or discomfort you are trying to eliminate should never worsen during these exercises. If this pain does worsen, stop and make certain you are following the directions exactly. If the pain is still present after adjustments, discontinue the exercise until you can discuss the trouble with your clinician. STRENGTHENING - Deep Abdominals, Pelvic Tilt   Lie on a firm bed or floor. Keeping your legs in front of you, bend your knees so they are both pointed toward the ceiling and your feet are flat on the floor.  Tense your lower abdominal muscles to press your low back into the floor. This motion will rotate your pelvis so that your tail bone is scooping upwards rather than pointing at your feet or into the  floor.  With a gentle tension and even breathing, hold this position for __________ seconds. Repeat __________ times. Complete this exercise __________ times per day.  STRENGTHENING - Abdominals, Crunches   Lie on a firm bed or floor. Keeping your legs in front of you, bend your knees so they are both pointed toward the ceiling and your feet are flat on the floor. Cross your arms over your chest.  Slightly tip your chin down without bending your neck.  Tense your abdominals and slowly lift your trunk high enough to just clear your shoulder blades. Lifting higher can put excessive stress on the low back and does not further strengthen your abdominal muscles.  Control your return to the starting position. Repeat __________ times. Complete this exercise __________ times per day.    STRENGTHENING - Quadruped, Opposite UE/LE Lift  Assume a hands and knees position on a firm surface. Keep your hands under your shoulders and your knees under your hips. You may place padding under your knees for comfort.  Find your neutral spine and gently tense your abdominal muscles so that you can maintain this position. Your shoulders and hips should form a rectangle that is parallel with the floor and is not twisted.  Keeping your trunk steady, lift your right hand no higher than your shoulder and then your left leg no higher than your hip. Make sure you are not holding your breath. Hold this position __________ seconds.  Continuing to keep your abdominal muscles tense and your back steady, slowly return to your starting position. Repeat with the opposite arm and leg. Repeat __________ times. Complete this exercise __________ times per day.  STRENGTHENING - Abdominals and Quadriceps, Straight Leg Raise   Lie on a firm bed or floor with both legs extended in front of you.  Keeping one leg in contact with the floor, bend the other knee so that your foot can rest flat on the floor.  Find your neutral spine, and tense your abdominal muscles to maintain your spinal position throughout the exercise.  Slowly lift your straight leg off the floor about 6 inches for a count of 15, making sure to not hold your breath.  Still keeping your neutral spine, slowly lower your leg all the way to the floor. Repeat this exercise with each leg __________ times. Complete this exercise __________ times per day. POSTURE AND BODY MECHANICS CONSIDERATIONS - Sciatica Keeping correct posture when sitting, standing or completing your activities will reduce the stress put on different body tissues, allowing injured tissues a chance to heal and limiting painful experiences. The following are general guidelines for improved posture. Your physician or physical therapist will provide you with any instructions specific  to your needs. While reading these guidelines, remember:  The exercises prescribed by your provider will help you have the flexibility and strength to maintain correct postures.  The correct posture provides the optimal environment for your joints to work. All of your joints have less wear and tear when properly supported by a spine with good posture. This means you will experience a healthier, less painful body.  Correct posture must be practiced with all of your activities, especially prolonged sitting and standing. Correct posture is as important when doing repetitive low-stress activities (typing) as it is when doing a single heavy-load activity (lifting). RESTING POSITIONS Consider which positions are most painful for you when choosing a resting position. If you have pain with flexion-based activities (sitting, bending, stooping, squatting), choose a position that allows you  to rest in a less flexed posture. You would want to avoid curling into a fetal position on your side. If your pain worsens with extension-based activities (prolonged standing, working overhead), avoid resting in an extended position such as sleeping on your stomach. Most people will find more comfort when they rest with their spine in a more neutral position, neither too rounded nor too arched. Lying on a non-sagging bed on your side with a pillow between your knees, or on your back with a pillow under your knees will often provide some relief. Keep in mind, being in any one position for a prolonged period of time, no matter how correct your posture, can still lead to stiffness. PROPER SITTING POSTURE In order to minimize stress and discomfort on your spine, you must sit with correct posture Sitting with good posture should be effortless for a healthy body. Returning to good posture is a gradual process. Many people can work toward this most comfortably by using various supports until they have the flexibility and strength to  maintain this posture on their own. When sitting with proper posture, your ears will fall over your shoulders and your shoulders will fall over your hips. You should use the back of the chair to support your upper back. Your low back will be in a neutral position, just slightly arched. You may place a small pillow or folded towel at the base of your low back for support.  When working at a desk, create an environment that supports good, upright posture. Without extra support, muscles fatigue and lead to excessive strain on joints and other tissues. Keep these recommendations in mind: CHAIR:   A chair should be able to slide under your desk when your back makes contact with the back of the chair. This allows you to work closely.  The chair's height should allow your eyes to be level with the upper part of your monitor and your hands to be slightly lower than your elbows. BODY POSITION  Your feet should make contact with the floor. If this is not possible, use a foot rest.  Keep your ears over your shoulders. This will reduce stress on your neck and low back. INCORRECT SITTING POSTURES   If you are feeling tired and unable to assume a healthy sitting posture, do not slouch or slump. This puts excessive strain on your back tissues, causing more damage and pain. Healthier options include:  Using more support, like a lumbar pillow.  Switching tasks to something that requires you to be upright or walking.  Talking a brief walk.  Lying down to rest in a neutral-spine position. PROLONGED STANDING WHILE SLIGHTLY LEANING FORWARD  When completing a task that requires you to lean forward while standing in one place for a long time, place either foot up on a stationary 2-4 inch high object to help maintain the best posture. When both feet are on the ground, the low back tends to lose its slight inward curve. If this curve flattens (or becomes too large), then the back and your other joints will  experience too much stress, fatigue more quickly and can cause pain.  CORRECT STANDING POSTURES Proper standing posture should be assumed with all daily activities, even if they only take a few moments, like when brushing your teeth. As in sitting, your ears should fall over your shoulders and your shoulders should fall over your hips. You should keep a slight tension in your abdominal muscles to brace your spine. Your tailbone  should point down to the ground, not behind your body, resulting in an over-extended swayback posture.  INCORRECT STANDING POSTURES  Common incorrect standing postures include a forward head, locked knees and/or an excessive swayback. WALKING Walk with an upright posture. Your ears, shoulders and hips should all line-up. PROLONGED ACTIVITY IN A FLEXED POSITION When completing a task that requires you to bend forward at your waist or lean over a low surface, try to find a way to stabilize 3 of 4 of your limbs. You can place a hand or elbow on your thigh or rest a knee on the surface you are reaching across. This will provide you more stability so that your muscles do not fatigue as quickly. By keeping your knees relaxed, or slightly bent, you will also reduce stress across your low back. CORRECT LIFTING TECHNIQUES DO :   Assume a wide stance. This will provide you more stability and the opportunity to get as close as possible to the object which you are lifting.  Tense your abdominals to brace your spine; then bend at the knees and hips. Keeping your back locked in a neutral-spine position, lift using your leg muscles. Lift with your legs, keeping your back straight.  Test the weight of unknown objects before attempting to lift them.  Try to keep your elbows locked down at your sides in order get the best strength from your shoulders when carrying an object.  Always ask for help when lifting heavy or awkward objects. INCORRECT LIFTING TECHNIQUES DO NOT:   Lock your  knees when lifting, even if it is a small object.  Bend and twist. Pivot at your feet or move your feet when needing to change directions.  Assume that you cannot safely pick up a paperclip without proper posture. Document Released: 04/07/2005 Document Revised: 08/22/2013 Document Reviewed: 07/20/2008 [facility] Patient Information 2015 [place], [place]. This information is not intended to replace advice given to you by your health care provider. Make sure you discuss any questions you have with your health care provider.

## 2014-03-25 NOTE — ED Notes (Signed)
Pt reports hx of back surgery. Pt thinks he has ruptured another disk in his back. Having lower back pain that radiates down right leg, tingling to bilateral toes. Ambulatory at triage with a cane. Pt also having dental pain and vomiting.

## 2014-03-27 ENCOUNTER — Telehealth: Payer: Self-pay | Admitting: Internal Medicine

## 2014-03-27 NOTE — Telephone Encounter (Signed)
I'll not be able to prescribe controlled substances (ie Belsomra) due to the fact that [name] was obtaining Percocet from another provider (it was a breach of Pain contract). Thx

## 2014-03-27 NOTE — Telephone Encounter (Signed)
[facility] called requesting refill for pt. [phone] fax#, [phone] phone# if any questions ext [phone]. The medication is Suvorexant (BELSOMRA) 15 MG TABS .

## 2014-03-28 NOTE — Telephone Encounter (Signed)
Called pharmacy spoke with rep Minette Brine gave her md response concerning the Belsomra...[name]

## 2014-03-29 ENCOUNTER — Telehealth: Payer: Self-pay | Admitting: Internal Medicine

## 2014-03-29 NOTE — Telephone Encounter (Signed)
See tel note 03/08/14:  "[facility], Event organiser (pharmacist, called to make MD aware that Patient is getting meds from multiple providers) 10/15 Percocet from [facility] ([place]), 9/16 Percocet from [facility] ([place]), 8/18 Percocet from [facility], 7/20 Percocet from [facility] ([place]), and getting meds from Dr [name] at the same time."  It was a "breach of Pain contract". I'm sorry! AP

## 2014-03-29 NOTE — Telephone Encounter (Signed)
Patient called in  regards to percocet.  Patient states he had told Dr. [name] about [facility] prescribing percocet.  He also states that [employer] should have copied Dr. [name] in on all OV with that office.  States Dr. Camila Li should have received a visit note from [facility] around 11/07/13  in regards to OV stating they are giving him percocet but Dr. Camila Li is to continue all med control.

## 2014-05-16 ENCOUNTER — Ambulatory Visit: Payer: Self-pay | Admitting: Internal Medicine

## 2014-05-31 ENCOUNTER — Ambulatory Visit (INDEPENDENT_AMBULATORY_CARE_PROVIDER_SITE_OTHER): Payer: No Typology Code available for payment source | Admitting: Internal Medicine

## 2014-05-31 ENCOUNTER — Encounter: Payer: Self-pay | Admitting: Internal Medicine

## 2014-05-31 VITALS — BP 176/102 | HR 94 | Temp 98.4°F | Wt 233.0 lb

## 2014-05-31 DIAGNOSIS — M5440 Lumbago with sciatica, unspecified side: Secondary | ICD-10-CM

## 2014-05-31 DIAGNOSIS — E785 Hyperlipidemia, unspecified: Secondary | ICD-10-CM

## 2014-05-31 DIAGNOSIS — I10 Essential (primary) hypertension: Secondary | ICD-10-CM

## 2014-05-31 DIAGNOSIS — E291 Testicular hypofunction: Secondary | ICD-10-CM

## 2014-05-31 DIAGNOSIS — E119 Type 2 diabetes mellitus without complications: Secondary | ICD-10-CM

## 2014-05-31 MED ORDER — LOSARTAN POTASSIUM-HCTZ 100-12.5 MG PO TABS: 1.0000 | ORAL_TABLET | Freq: Every day | ORAL | Status: DC

## 2014-05-31 MED ORDER — CLONAZEPAM 2 MG PO TBDP: 2.0000 mg | ORAL_TABLET | Freq: Three times a day (TID) | ORAL | Status: DC | PRN

## 2014-05-31 MED ORDER — SUVOREXANT 15 MG PO TABS: 15.0000 mg | ORAL_TABLET | Freq: Every evening | ORAL | Status: DC | PRN

## 2014-05-31 MED ORDER — TESTOSTERONE 20.25 MG/1.25GM (1.62%) TD GEL: 3.0000 "application " | Freq: Every day | TRANSDERMAL | Status: DC

## 2014-05-31 MED ORDER — CLONAZEPAM 2 MG PO TABS: 2.0000 mg | ORAL_TABLET | Freq: Three times a day (TID) | ORAL | Status: DC | PRN

## 2014-05-31 NOTE — Assessment & Plan Note (Signed)
Start Losartan HCT NAS diet Cont w/wt loss

## 2014-05-31 NOTE — Assessment & Plan Note (Signed)
He is planning to see NS

## 2014-05-31 NOTE — Assessment & Plan Note (Addendum)
Continue with current prescription therapy as reflected on the Med list.  

## 2014-05-31 NOTE — Progress Notes (Signed)
Subjective:    HPI    S/p R THR 4/15 - better C/o L hip pain - needs THR in the future C/o LBP - needs to see a NS - Dr Vertell Limber   Back Pain   This is a chronic problem. The current episode started more than 1 year ago. The pain is present in the lumbar spine and gluteal. The quality of the pain is described as aching and burning. The pain is at a severity of 7/10. Hysingla helped. The pain is severe. The symptoms are aggravated by bending, position, standing and twisting. Stiffness is present all day. Pertinent negatives include no chest pain or weakness. He has tried analgesics and muscle relaxant for the symptoms. The treatment provided mild relief.  Norco is not helping  He took pain meds, clonazepam last on 4/20  The patient presents for a follow-up of hypogonadism, chronic hypertension, chronic dyslipidemia, type 2 diabetes controlled with medicines. C/o ED  F/u being depressed. Seeing a therapist...    [name] is not working now (since 2015), lives w/his sister's family    Wt Readings from Last 3 Encounters:  05/31/14 233 lb (105.688 kg)  02/13/14 248 lb (112.492 kg)  11/11/13 237 lb (107.502 kg)   BP Readings from Last 3 Encounters:  05/31/14 176/102  03/25/14 122/68  02/13/14 140/100       Review of Systems  Constitutional: Negative for appetite change, fatigue and unexpected weight change.  HENT: Negative for congestion, nosebleeds, sneezing, sore throat and trouble swallowing.   Eyes: Negative for itching and visual disturbance.  Respiratory: Negative for cough.   Cardiovascular: Negative for chest pain, palpitations and leg swelling.  Gastrointestinal: Negative for nausea, diarrhea, blood in stool and abdominal distention.  Genitourinary: Negative for frequency and hematuria.  Musculoskeletal: Positive for back pain, arthralgias and gait problem. Negative for joint swelling and neck pain.  Skin: Negative for rash.  Neurological: Negative for dizziness,  tremors, speech difficulty and weakness.  Psychiatric/Behavioral: Negative for suicidal ideas, sleep disturbance, dysphoric mood and agitation. The patient is not nervous/anxious.        Objective:   Physical Exam  Constitutional: He is oriented to person, place, and time. He appears well-developed. No distress.  HENT:  Mouth/Throat: Oropharynx is clear and moist.  Eyes: Conjunctivae are normal. Pupils are equal, round, and reactive to light.  Neck: Normal range of motion. No JVD present. No thyromegaly present.  Cardiovascular: Normal rate, regular rhythm, normal heart sounds and intact distal pulses.  Exam reveals no gallop and no friction rub.   No murmur heard. Pulmonary/Chest: Effort normal and breath sounds normal. No respiratory distress. He has no wheezes. He has no rales. He exhibits no tenderness.  Abdominal: Soft. Bowel sounds are normal. He exhibits no distension and no mass. There is no tenderness. There is no rebound and no guarding.  Musculoskeletal: Normal range of motion. He exhibits tenderness (R hip, LS pine). He exhibits no edema.  Limping LS is tender w/ROM L hip is tender w/ROM  Lymphadenopathy:    He has no cervical adenopathy.  Neurological: He is alert and oriented to person, place, and time. He has normal reflexes. No cranial nerve deficit. He exhibits normal muscle tone. Coordination normal.  Skin: Skin is warm and dry. No rash noted.  Psychiatric: His behavior is normal. Judgment and thought content normal.  Not tearful    Lab Results  Component Value Date   WBC 7.2 11/17/2013   HGB 13.7 11/17/2013  HCT 40.2 11/17/2013   PLT 141.0* 11/17/2013   GLUCOSE 98 11/22/2013   CHOL 212* 11/22/2013   TRIG * 11/22/2013    621.0 Triglyceride is over 400; calculations on Lipids are invalid.   HDL 27.70* 11/22/2013   LDLDIRECT 112.6 11/22/2013   LDLCALC 84 12/11/2006   ALT 20 11/17/2013   AST 21 11/17/2013   NA 142 11/22/2013   K 4.1 11/22/2013   CL 106  11/22/2013   CREATININE 1.5 11/22/2013   BUN 26* 11/22/2013   CO2 30 11/22/2013   TSH 3.24 11/17/2013   PSA 1.41 10/30/2010   INR 0.95 08/09/2013   HGBA1C 6.9* 11/22/2013         Assessment & Plan:

## 2014-05-31 NOTE — Progress Notes (Signed)
Pre visit review using our clinic review tool, if applicable. No additional management support is needed unless otherwise documented below in the visit note. 

## 2014-05-31 NOTE — Assessment & Plan Note (Signed)
Low carb diet 

## 2014-05-31 NOTE — Assessment & Plan Note (Signed)
Continue with current prescription therapy as reflected on the Med list. Labs  

## 2014-06-01 ENCOUNTER — Encounter: Payer: Self-pay | Admitting: Internal Medicine

## 2014-06-01 ENCOUNTER — Other Ambulatory Visit (INDEPENDENT_AMBULATORY_CARE_PROVIDER_SITE_OTHER): Payer: No Typology Code available for payment source

## 2014-06-01 ENCOUNTER — Other Ambulatory Visit: Payer: Self-pay | Admitting: Internal Medicine

## 2014-06-01 DIAGNOSIS — E785 Hyperlipidemia, unspecified: Secondary | ICD-10-CM

## 2014-06-01 DIAGNOSIS — M5440 Lumbago with sciatica, unspecified side: Secondary | ICD-10-CM

## 2014-06-01 DIAGNOSIS — E291 Testicular hypofunction: Secondary | ICD-10-CM

## 2014-06-01 DIAGNOSIS — E119 Type 2 diabetes mellitus without complications: Secondary | ICD-10-CM

## 2014-06-01 LAB — URINALYSIS, ROUTINE W REFLEX MICROSCOPIC
Bilirubin Urine: NEGATIVE
KETONES UR: NEGATIVE
LEUKOCYTES UA: NEGATIVE
Nitrite: NEGATIVE
PH: 6 (ref 5.0–8.0)
Total Protein, Urine: 30 — AB
Urine Glucose: >=1000 — AB
Urobilinogen, UA: 0.2 (ref 0.0–1.0)

## 2014-06-01 LAB — MICROALBUMIN / CREATININE URINE RATIO
CREATININE, U: 163 mg/dL
MICROALB UR: 42.2 mg/dL — AB (ref 0.0–1.9)
Microalb Creat Ratio: 25.9 mg/g (ref 0.0–30.0)

## 2014-06-01 LAB — BASIC METABOLIC PANEL
BUN: 20 mg/dL (ref 6–23)
CHLORIDE: 102 meq/L (ref 96–112)
CO2: 30 meq/L (ref 19–32)
CREATININE: 1.2 mg/dL (ref 0.40–1.50)
Calcium: 9.8 mg/dL (ref 8.4–10.5)
GFR: 69.14 mL/min (ref >60.00)
Glucose, Bld: 250 mg/dL — ABNORMAL HIGH (ref 70–99)
Potassium: 4.5 mEq/L (ref 3.5–5.1)
SODIUM: 140 meq/L (ref 135–145)

## 2014-06-01 LAB — HEPATIC FUNCTION PANEL
ALT: 56 U/L — ABNORMAL HIGH (ref 0–53)
AST: 33 U/L (ref 0–37)
Albumin: 4.4 g/dL (ref 3.5–5.2)
Alkaline Phosphatase: 80 U/L (ref 39–117)
BILIRUBIN DIRECT: 0.1 mg/dL (ref 0.0–0.3)
Total Bilirubin: 0.6 mg/dL (ref 0.2–1.2)
Total Protein: 7.4 g/dL (ref 6.0–8.3)

## 2014-06-01 LAB — LIPID PANEL
Cholesterol: 233 mg/dL — ABNORMAL HIGH (ref 0–200)
HDL: 30.7 mg/dL — AB (ref >39.00)
Total CHOL/HDL Ratio: 8

## 2014-06-01 LAB — PSA: PSA: 1.54 ng/mL (ref 0.10–4.00)

## 2014-06-01 LAB — HEMOGLOBIN A1C: HEMOGLOBIN A1C: 11.1 % — AB (ref 4.6–6.5)

## 2014-06-01 LAB — TESTOSTERONE: TESTOSTERONE: 315.19 ng/dL (ref 300.00–890.00)

## 2014-06-01 LAB — LDL CHOLESTEROL, DIRECT: Direct LDL: 112 mg/dL

## 2014-06-01 MED ORDER — METFORMIN HCL 500 MG PO TABS: 500.0000 mg | ORAL_TABLET | Freq: Three times a day (TID) | ORAL | Status: DC

## 2014-06-02 ENCOUNTER — Other Ambulatory Visit: Payer: Self-pay | Admitting: Internal Medicine

## 2014-06-02 DIAGNOSIS — M545 Low back pain: Secondary | ICD-10-CM

## 2014-06-02 DIAGNOSIS — M79605 Pain in left leg: Secondary | ICD-10-CM

## 2014-06-19 ENCOUNTER — Telehealth: Payer: Self-pay | Admitting: *Deleted

## 2014-06-19 NOTE — Telephone Encounter (Signed)
Pt can not afford Clonazepam ODT 2 mg. Request to change. Please advise.

## 2014-06-20 NOTE — Telephone Encounter (Signed)
OK to change to regular clonazepam tabs Thx

## 2014-06-20 NOTE — Telephone Encounter (Signed)
[facility] informed.

## 2014-06-28 ENCOUNTER — Encounter: Payer: Self-pay | Admitting: Internal Medicine

## 2014-06-28 ENCOUNTER — Telehealth: Payer: Self-pay | Admitting: Internal Medicine

## 2014-06-28 NOTE — Telephone Encounter (Signed)
Pt request sample for glucose meter or write him a rx for this, pt stated Dr. Camila Li gave him one years ago.

## 2014-06-29 NOTE — Telephone Encounter (Signed)
[employer] Verio meter is upfront for p/u.  Pt informed

## 2014-07-04 ENCOUNTER — Encounter: Payer: Self-pay | Admitting: Internal Medicine

## 2014-07-05 ENCOUNTER — Encounter: Payer: Self-pay | Admitting: Internal Medicine

## 2014-07-06 ENCOUNTER — Telehealth: Payer: Self-pay | Admitting: Internal Medicine

## 2014-07-06 ENCOUNTER — Other Ambulatory Visit: Payer: Self-pay | Admitting: Internal Medicine

## 2014-07-06 DIAGNOSIS — M5440 Lumbago with sciatica, unspecified side: Secondary | ICD-10-CM

## 2014-07-06 NOTE — Telephone Encounter (Signed)
Dr. Luther Redo, please read 3/15 and 3/16 emails from pt and advise. Thanks!

## 2014-07-06 NOTE — Telephone Encounter (Signed)
Pt request phone call from the assistant concern about tow massages that he sent from my chart and he is out of this meds. Please call him

## 2014-07-07 MED ORDER — TESTOSTERONE CYPIONATE 200 MG/ML IM SOLN: 200.0000 mg | INTRAMUSCULAR | Status: DC

## 2014-07-07 MED ORDER — ZOLPIDEM TARTRATE ER 12.5 MG PO TBCR: 12.5000 mg | EXTENDED_RELEASE_TABLET | Freq: Every evening | ORAL | Status: DC | PRN

## 2014-07-07 NOTE — Telephone Encounter (Signed)
Scripts fax to [employer] [facility]...Johny Chess

## 2014-07-07 NOTE — Telephone Encounter (Signed)
Ok Ambien CR OK Androgel inj Thx

## 2014-07-10 ENCOUNTER — Encounter: Payer: Self-pay | Admitting: Internal Medicine

## 2014-07-10 ENCOUNTER — Ambulatory Visit (INDEPENDENT_AMBULATORY_CARE_PROVIDER_SITE_OTHER): Payer: No Typology Code available for payment source | Admitting: Internal Medicine

## 2014-07-10 ENCOUNTER — Telehealth: Payer: Self-pay | Admitting: Internal Medicine

## 2014-07-10 VITALS — BP 130/90 | HR 96 | Wt 233.0 lb

## 2014-07-10 DIAGNOSIS — E1165 Type 2 diabetes mellitus with hyperglycemia: Secondary | ICD-10-CM

## 2014-07-10 DIAGNOSIS — I10 Essential (primary) hypertension: Secondary | ICD-10-CM

## 2014-07-10 DIAGNOSIS — M5440 Lumbago with sciatica, unspecified side: Secondary | ICD-10-CM

## 2014-07-10 DIAGNOSIS — IMO0002 Reserved for concepts with insufficient information to code with codable children: Secondary | ICD-10-CM

## 2014-07-10 DIAGNOSIS — N529 Male erectile dysfunction, unspecified: Secondary | ICD-10-CM

## 2014-07-10 MED ORDER — TADALAFIL 20 MG PO TABS: 20.0000 mg | ORAL_TABLET | Freq: Every day | ORAL | Status: DC | PRN

## 2014-07-10 MED ORDER — METFORMIN HCL 1000 MG PO TABS: 1000.0000 mg | ORAL_TABLET | Freq: Two times a day (BID) | ORAL | Status: DC

## 2014-07-10 NOTE — Telephone Encounter (Signed)
Rx 's was fax to [facility] on Friday. Called pharmacy spoke with Marjory Lies he stated they didn't receive. Gave him verbal order for pt testosterone & zolpidem...[name]

## 2014-07-10 NOTE — Progress Notes (Signed)
Subjective:    HPI  C/o elevated sugars CBG 140-230 C/o ED  S/p R THR 4/15 - better C/o L hip pain - needs THR in the future C/o LBP - needs to see a NS - Dr Faye Ramsay is not working now (since 2015), lives w/his sister's family    Wt Readings from Last 3 Encounters:  07/10/14 233 lb (105.688 kg)  05/31/14 233 lb (105.688 kg)  02/13/14 248 lb (112.492 kg)   BP Readings from Last 3 Encounters:  07/10/14 130/90  05/31/14 176/102  03/25/14 122/68       Review of Systems  Constitutional: Negative for appetite change, fatigue and unexpected weight change.  HENT: Negative for congestion, nosebleeds, sneezing, sore throat and trouble swallowing.   Eyes: Negative for itching and visual disturbance.  Respiratory: Negative for cough.   Cardiovascular: Negative for chest pain, palpitations and leg swelling.  Gastrointestinal: Negative for nausea, diarrhea, blood in stool and abdominal distention.  Genitourinary: Negative for frequency and hematuria.  Musculoskeletal: Positive for back pain, arthralgias and gait problem. Negative for joint swelling and neck pain.  Skin: Negative for rash.  Neurological: Negative for dizziness, tremors, speech difficulty and weakness.  Psychiatric/Behavioral: Negative for suicidal ideas, sleep disturbance, dysphoric mood and agitation. The patient is not nervous/anxious.        Objective:   Physical Exam  Constitutional: He is oriented to person, place, and time. He appears well-developed. No distress.  HENT:  Mouth/Throat: Oropharynx is clear and moist.  Eyes: Conjunctivae are normal. Pupils are equal, round, and reactive to light.  Neck: Normal range of motion. No JVD present. No thyromegaly present.  Cardiovascular: Normal rate, regular rhythm, normal heart sounds and intact distal pulses.  Exam reveals no gallop and no friction rub.   No murmur heard. Pulmonary/Chest: Effort normal and breath sounds normal. No respiratory  distress. He has no wheezes. He has no rales. He exhibits no tenderness.  Abdominal: Soft. Bowel sounds are normal. He exhibits no distension and no mass. There is no tenderness. There is no rebound and no guarding.  Musculoskeletal: Normal range of motion. He exhibits tenderness (R hip, LS pine). He exhibits no edema.  Limping LS is tender w/ROM L hip is tender w/ROM  Lymphadenopathy:    He has no cervical adenopathy.  Neurological: He is alert and oriented to person, place, and time. He has normal reflexes. No cranial nerve deficit. He exhibits normal muscle tone. Coordination normal.  Skin: Skin is warm and dry. No rash noted.  Psychiatric: His behavior is normal. Judgment and thought content normal.  Not tearful    Lab Results  Component Value Date   WBC 7.2 11/17/2013   HGB 13.7 11/17/2013   HCT 40.2 11/17/2013   PLT 141.0* 11/17/2013   GLUCOSE 250* 06/01/2014   CHOL 233* 06/01/2014   TRIG * 06/01/2014    846.0 Triglyceride is over 400; calculations on Lipids are invalid.   HDL 30.70* 06/01/2014   LDLDIRECT 112.0 06/01/2014   LDLCALC 84 12/11/2006   ALT 56* 06/01/2014   AST 33 06/01/2014   NA 140 06/01/2014   K 4.5 06/01/2014   CL 102 06/01/2014   CREATININE 1.20 06/01/2014   BUN 20 06/01/2014   CO2 30 06/01/2014   TSH 3.24 11/17/2013   PSA 1.54 06/01/2014   INR 0.95 08/09/2013   HGBA1C 11.1* 06/01/2014   MICROALBUR 42.2* 06/01/2014         Assessment & Plan:

## 2014-07-10 NOTE — Progress Notes (Signed)
Pre visit review using our clinic review tool, if applicable. No additional management support is needed unless otherwise documented below in the visit note. 

## 2014-07-10 NOTE — Telephone Encounter (Signed)
Patient stated that his pharmacy haven't received medication Testosterone, Ambien,please advise

## 2014-07-10 NOTE — Assessment & Plan Note (Signed)
2016 [facility]

## 2014-07-10 NOTE — Assessment & Plan Note (Signed)
Worse 

## 2014-07-10 NOTE — Assessment & Plan Note (Signed)
Losartan HCT 

## 2014-07-10 NOTE — Assessment & Plan Note (Signed)
Cialis 20 mg prn

## 2014-08-29 ENCOUNTER — Ambulatory Visit: Payer: Self-pay | Admitting: Internal Medicine

## 2014-09-04 ENCOUNTER — Encounter: Payer: Self-pay | Admitting: Internal Medicine

## 2014-09-04 ENCOUNTER — Ambulatory Visit (INDEPENDENT_AMBULATORY_CARE_PROVIDER_SITE_OTHER): Payer: No Typology Code available for payment source | Admitting: Internal Medicine

## 2014-09-04 VITALS — BP 150/90 | HR 102 | Wt 221.0 lb

## 2014-09-04 DIAGNOSIS — I10 Essential (primary) hypertension: Secondary | ICD-10-CM

## 2014-09-04 DIAGNOSIS — G4700 Insomnia, unspecified: Secondary | ICD-10-CM

## 2014-09-04 DIAGNOSIS — F411 Generalized anxiety disorder: Secondary | ICD-10-CM

## 2014-09-04 DIAGNOSIS — IMO0002 Reserved for concepts with insufficient information to code with codable children: Secondary | ICD-10-CM

## 2014-09-04 DIAGNOSIS — F3132 Bipolar disorder, current episode depressed, moderate: Secondary | ICD-10-CM | POA: Diagnosis not present

## 2014-09-04 DIAGNOSIS — R634 Abnormal weight loss: Secondary | ICD-10-CM | POA: Insufficient documentation

## 2014-09-04 DIAGNOSIS — E1165 Type 2 diabetes mellitus with hyperglycemia: Secondary | ICD-10-CM | POA: Diagnosis not present

## 2014-09-04 MED ORDER — BUPROPION HCL ER (SR) 150 MG PO TB12: 150.0000 mg | ORAL_TABLET | Freq: Two times a day (BID) | ORAL | Status: DC

## 2014-09-04 MED ORDER — MIRTAZAPINE 45 MG PO TABS: 45.0000 mg | ORAL_TABLET | Freq: Every day | ORAL | Status: DC

## 2014-09-04 NOTE — Progress Notes (Signed)
Pre visit review using our clinic review tool, if applicable. No additional management support is needed unless otherwise documented below in the visit note. 

## 2014-09-04 NOTE — Assessment & Plan Note (Addendum)
Chronic  Off Depakote x 1 mo - can't tol well regular Depakote due to upset stomach - diarrhea 5/16 On Wellbutrin, Remeron Will ref to Psychiatry

## 2014-09-04 NOTE — Assessment & Plan Note (Signed)
Better Metformin

## 2014-09-04 NOTE — Assessment & Plan Note (Signed)
7/15 refractory Zolpidem

## 2014-09-04 NOTE — Assessment & Plan Note (Signed)
Chronic Suboptimal control Losartan-HCT

## 2014-09-04 NOTE — Progress Notes (Signed)
Subjective:    HPI  F/u elevated sugars CBG 140-230 F/u ED  S/p R THR 4/15 - better C/o L hip pain - needs THR in the future C/o LBP - needs to see a NS - Dr Vertell Limber - getting epidurals now    [name] is not working now (since 2015), lives w/his sister's family    Wt Readings from Last 3 Encounters:  09/04/14 221 lb (100.245 kg)  07/10/14 233 lb (105.688 kg)  05/31/14 233 lb (105.688 kg)   BP Readings from Last 3 Encounters:  09/04/14 150/90  07/10/14 130/90  05/31/14 176/102       Review of Systems  Constitutional: Negative for appetite change, fatigue and unexpected weight change.  HENT: Negative for congestion, nosebleeds, sneezing, sore throat and trouble swallowing.   Eyes: Negative for itching and visual disturbance.  Respiratory: Negative for cough.   Cardiovascular: Negative for chest pain, palpitations and leg swelling.  Gastrointestinal: Negative for nausea, diarrhea, blood in stool and abdominal distention.  Genitourinary: Negative for frequency and hematuria.  Musculoskeletal: Positive for back pain, arthralgias and gait problem. Negative for joint swelling and neck pain.  Skin: Negative for rash.  Neurological: Negative for dizziness, tremors, speech difficulty and weakness.  Psychiatric/Behavioral: Negative for suicidal ideas, sleep disturbance, dysphoric mood and agitation. The patient is not nervous/anxious.        Objective:   Physical Exam  Constitutional: He is oriented to person, place, and time. He appears well-developed. No distress.  HENT:  Mouth/Throat: Oropharynx is clear and moist.  Eyes: Conjunctivae are normal. Pupils are equal, round, and reactive to light.  Neck: Normal range of motion. No JVD present. No thyromegaly present.  Cardiovascular: Normal rate, regular rhythm, normal heart sounds and intact distal pulses.  Exam reveals no gallop and no friction rub.   No murmur heard. Pulmonary/Chest: Effort normal and breath sounds  normal. No respiratory distress. He has no wheezes. He has no rales. He exhibits no tenderness.  Abdominal: Soft. Bowel sounds are normal. He exhibits no distension and no mass. There is no tenderness. There is no rebound and no guarding.  Musculoskeletal: Normal range of motion. He exhibits tenderness (R hip, LS pine). He exhibits no edema.  Limping LS is tender w/ROM L hip is tender w/ROM  Lymphadenopathy:    He has no cervical adenopathy.  Neurological: He is alert and oriented to person, place, and time. He has normal reflexes. No cranial nerve deficit. He exhibits normal muscle tone. Coordination normal.  Skin: Skin is warm and dry. No rash noted.  Psychiatric: His behavior is normal. Judgment and thought content normal.  Not tearful    Lab Results  Component Value Date   WBC 7.2 11/17/2013   HGB 13.7 11/17/2013   HCT 40.2 11/17/2013   PLT 141.0* 11/17/2013   GLUCOSE 250* 06/01/2014   CHOL 233* 06/01/2014   TRIG * 06/01/2014    846.0 Triglyceride is over 400; calculations on Lipids are invalid.   HDL 30.70* 06/01/2014   LDLDIRECT 112.0 06/01/2014   LDLCALC 84 12/11/2006   ALT 56* 06/01/2014   AST 33 06/01/2014   NA 140 06/01/2014   K 4.5 06/01/2014   CL 102 06/01/2014   CREATININE 1.20 06/01/2014   BUN 20 06/01/2014   CO2 30 06/01/2014   TSH 3.24 11/17/2013   PSA 1.54 06/01/2014   INR 0.95 08/09/2013   HGBA1C 11.1* 06/01/2014   MICROALBUR 42.2* 06/01/2014         Assessment &  Plan:

## 2014-09-04 NOTE — Assessment & Plan Note (Signed)
Chronic  On Clonazepam, Remeron  Potential benefits of a long term benzodiazepines  use as well as potential risks  and complications were explained to the patient and were aknowledged.

## 2014-09-04 NOTE — Assessment & Plan Note (Addendum)
Off Depakote x 1 mo - can't tol well regular Depakote due to upset stomach - diarrhea Diarrhea has stopped Labs w/HIV test

## 2014-09-15 ENCOUNTER — Other Ambulatory Visit (INDEPENDENT_AMBULATORY_CARE_PROVIDER_SITE_OTHER): Payer: No Typology Code available for payment source

## 2014-09-15 DIAGNOSIS — IMO0002 Reserved for concepts with insufficient information to code with codable children: Secondary | ICD-10-CM

## 2014-09-15 DIAGNOSIS — I10 Essential (primary) hypertension: Secondary | ICD-10-CM

## 2014-09-15 DIAGNOSIS — F3132 Bipolar disorder, current episode depressed, moderate: Secondary | ICD-10-CM | POA: Diagnosis not present

## 2014-09-15 DIAGNOSIS — E1165 Type 2 diabetes mellitus with hyperglycemia: Secondary | ICD-10-CM | POA: Diagnosis not present

## 2014-09-15 DIAGNOSIS — F411 Generalized anxiety disorder: Secondary | ICD-10-CM | POA: Diagnosis not present

## 2014-09-15 DIAGNOSIS — G4700 Insomnia, unspecified: Secondary | ICD-10-CM

## 2014-09-15 LAB — BASIC METABOLIC PANEL
BUN: 20 mg/dL (ref 6–23)
CO2: 24 mEq/L (ref 19–32)
Calcium: 9.9 mg/dL (ref 8.4–10.5)
Chloride: 102 mEq/L (ref 96–112)
Creatinine, Ser: 1.72 mg/dL — ABNORMAL HIGH (ref 0.40–1.50)
GFR: 45.58 mL/min — AB (ref >60.00)
Glucose, Bld: 167 mg/dL — ABNORMAL HIGH (ref 70–99)
POTASSIUM: 4 meq/L (ref 3.5–5.1)
SODIUM: 138 meq/L (ref 135–145)

## 2014-09-15 LAB — LIPID PANEL
Cholesterol: 146 mg/dL (ref 0–200)
HDL: 36.2 mg/dL — ABNORMAL LOW (ref >39.00)
NONHDL: 109.8
Total CHOL/HDL Ratio: 4
Triglycerides: 366 mg/dL — ABNORMAL HIGH (ref 0.0–149.0)
VLDL: 73.2 mg/dL — ABNORMAL HIGH (ref 0.0–40.0)

## 2014-09-15 LAB — LDL CHOLESTEROL, DIRECT: Direct LDL: 70 mg/dL

## 2014-09-15 LAB — TESTOSTERONE: Testosterone: 313.52 ng/dL (ref 300.00–890.00)

## 2014-09-15 LAB — HEMOGLOBIN A1C: Hgb A1c MFr Bld: 6.8 % — ABNORMAL HIGH (ref 4.6–6.5)

## 2014-09-16 ENCOUNTER — Encounter: Payer: Self-pay | Admitting: Internal Medicine

## 2014-09-16 LAB — HIV ANTIBODY (ROUTINE TESTING W REFLEX): HIV 1&2 Ab, 4th Generation: NONREACTIVE

## 2014-09-20 ENCOUNTER — Other Ambulatory Visit: Payer: Self-pay | Admitting: Internal Medicine

## 2014-09-20 MED ORDER — TESTOSTERONE CYPIONATE 200 MG/ML IM SOLN: 300.0000 mg | INTRAMUSCULAR | Status: DC

## 2014-09-21 ENCOUNTER — Other Ambulatory Visit: Payer: Self-pay | Admitting: Internal Medicine

## 2014-09-21 MED ORDER — TESTOSTERONE CYPIONATE 200 MG/ML IM SOLN: 300.0000 mg | INTRAMUSCULAR | Status: DC

## 2014-10-16 ENCOUNTER — Other Ambulatory Visit: Payer: Self-pay

## 2014-11-30 ENCOUNTER — Ambulatory Visit (INDEPENDENT_AMBULATORY_CARE_PROVIDER_SITE_OTHER): Payer: No Typology Code available for payment source | Admitting: Internal Medicine

## 2014-11-30 ENCOUNTER — Other Ambulatory Visit (INDEPENDENT_AMBULATORY_CARE_PROVIDER_SITE_OTHER): Payer: No Typology Code available for payment source

## 2014-11-30 ENCOUNTER — Encounter: Payer: Self-pay | Admitting: Internal Medicine

## 2014-11-30 VITALS — BP 130/90 | HR 108 | Wt 230.0 lb

## 2014-11-30 DIAGNOSIS — I10 Essential (primary) hypertension: Secondary | ICD-10-CM

## 2014-11-30 DIAGNOSIS — E1165 Type 2 diabetes mellitus with hyperglycemia: Secondary | ICD-10-CM

## 2014-11-30 DIAGNOSIS — Z23 Encounter for immunization: Secondary | ICD-10-CM | POA: Diagnosis not present

## 2014-11-30 DIAGNOSIS — E291 Testicular hypofunction: Secondary | ICD-10-CM

## 2014-11-30 DIAGNOSIS — F3175 Bipolar disorder, in partial remission, most recent episode depressed: Secondary | ICD-10-CM

## 2014-11-30 DIAGNOSIS — IMO0002 Reserved for concepts with insufficient information to code with codable children: Secondary | ICD-10-CM

## 2014-11-30 LAB — BASIC METABOLIC PANEL
BUN: 36 mg/dL — AB (ref 6–23)
CALCIUM: 10.4 mg/dL (ref 8.4–10.5)
CHLORIDE: 101 meq/L (ref 96–112)
CO2: 25 mEq/L (ref 19–32)
Creatinine, Ser: 2.12 mg/dL — ABNORMAL HIGH (ref 0.40–1.50)
GFR: 35.77 mL/min — ABNORMAL LOW (ref >60.00)
Glucose, Bld: 212 mg/dL — ABNORMAL HIGH (ref 70–99)
Potassium: 5 mEq/L (ref 3.5–5.1)
Sodium: 138 mEq/L (ref 135–145)

## 2014-11-30 LAB — CBC WITH DIFFERENTIAL/PLATELET
BASOS ABS: 0 10*3/uL (ref 0.0–0.1)
BASOS PCT: 0.5 % (ref 0.0–3.0)
EOS ABS: 0.1 10*3/uL (ref 0.0–0.7)
Eosinophils Relative: 1.2 % (ref 0.0–5.0)
HCT: 48 % (ref 39.0–52.0)
Hemoglobin: 16.7 g/dL (ref 13.0–17.0)
LYMPHS PCT: 31.6 % (ref 12.0–46.0)
Lymphs Abs: 2.6 10*3/uL (ref 0.7–4.0)
MCHC: 34.7 g/dL (ref 30.0–36.0)
MCV: 91.3 fl (ref 78.0–100.0)
Monocytes Absolute: 0.6 10*3/uL (ref 0.1–1.0)
Monocytes Relative: 7.8 % (ref 3.0–12.0)
NEUTROS PCT: 58.9 % (ref 43.0–77.0)
Neutro Abs: 4.9 10*3/uL (ref 1.4–7.7)
Platelets: 182 10*3/uL (ref 150.0–400.0)
RBC: 5.26 Mil/uL (ref 4.22–5.81)
RDW: 13.9 % (ref 11.5–15.5)
WBC: 8.3 10*3/uL (ref 4.0–10.5)

## 2014-11-30 LAB — HEMOGLOBIN A1C: HEMOGLOBIN A1C: 7.8 % — AB (ref 4.6–6.5)

## 2014-11-30 LAB — TESTOSTERONE: Testosterone: 625.99 ng/dL (ref 300.00–890.00)

## 2014-11-30 LAB — HEPATIC FUNCTION PANEL
ALK PHOS: 60 U/L (ref 39–117)
ALT: 41 U/L (ref 0–53)
AST: 29 U/L (ref 0–37)
Albumin: 5 g/dL (ref 3.5–5.2)
BILIRUBIN DIRECT: 0.1 mg/dL (ref 0.0–0.3)
BILIRUBIN TOTAL: 0.8 mg/dL (ref 0.2–1.2)
TOTAL PROTEIN: 8.2 g/dL (ref 6.0–8.3)

## 2014-11-30 LAB — VITAMIN D 25 HYDROXY (VIT D DEFICIENCY, FRACTURES): VITD: 54.66 ng/mL (ref 30.00–100.00)

## 2014-11-30 NOTE — Progress Notes (Signed)
Subjective:    HPI  F/u DM F/u ED, hypogonadism, bipolar depression - Ms Adelene Idler, [place]  Hx: S/p R THR 4/15 - better C/o L hip pain - needs THR in the future C/o LBP - needs to see a NS - Dr Vertell Limber - getting epidurals now    Johm is not working now (since 2015), lives w/his sister's family    Wt Readings from Last 3 Encounters:  11/30/14 230 lb (104.327 kg)  09/04/14 221 lb (100.245 kg)  07/10/14 233 lb (105.688 kg)   BP Readings from Last 3 Encounters:  11/30/14 130/90  09/04/14 150/90  07/10/14 130/90       Review of Systems  Constitutional: Positive for fatigue. Negative for appetite change and unexpected weight change.  HENT: Negative for congestion, nosebleeds, sneezing, sore throat and trouble swallowing.   Eyes: Negative for itching and visual disturbance.  Respiratory: Negative for cough.   Cardiovascular: Negative for chest pain, palpitations and leg swelling.  Gastrointestinal: Negative for nausea, diarrhea, blood in stool and abdominal distention.  Genitourinary: Negative for frequency and hematuria.  Musculoskeletal: Positive for back pain, arthralgias and gait problem. Negative for joint swelling and neck pain.  Skin: Negative for rash.  Neurological: Negative for dizziness, tremors, speech difficulty and weakness.  Psychiatric/Behavioral: Positive for decreased concentration. Negative for suicidal ideas, sleep disturbance, dysphoric mood and agitation. The patient is nervous/anxious.        Objective:   Physical Exam  Constitutional: He is oriented to person, place, and time. He appears well-developed. No distress.  HENT:  Mouth/Throat: Oropharynx is clear and moist.  Eyes: Conjunctivae are normal. Pupils are equal, round, and reactive to light.  Neck: Normal range of motion. No JVD present. No thyromegaly present.  Cardiovascular: Normal rate, regular rhythm, normal heart sounds and intact distal pulses.  Exam reveals no gallop and no friction rub.    No murmur heard. Pulmonary/Chest: Effort normal and breath sounds normal. No respiratory distress. He has no wheezes. He has no rales. He exhibits no tenderness.  Abdominal: Soft. Bowel sounds are normal. He exhibits no distension and no mass. There is no tenderness. There is no rebound and no guarding.  Musculoskeletal: Normal range of motion. He exhibits tenderness (R hip, LS pine). He exhibits no edema.  Limping LS is tender w/ROM L hip is tender w/ROM  Lymphadenopathy:    He has no cervical adenopathy.  Neurological: He is alert and oriented to person, place, and time. He has normal reflexes. No cranial nerve deficit. He exhibits normal muscle tone. Coordination normal.  Skin: Skin is warm and dry. No rash noted. He is not diaphoretic.  Psychiatric: His behavior is normal. Judgment and thought content normal.  Not tearful    Lab Results  Component Value Date   WBC 7.2 11/17/2013   HGB 13.7 11/17/2013   HCT 40.2 11/17/2013   PLT 141.0* 11/17/2013   GLUCOSE 167* 09/15/2014   CHOL 146 09/15/2014   TRIG 366.0* 09/15/2014   HDL 36.20* 09/15/2014   LDLDIRECT 70.0 09/15/2014   LDLCALC 84 12/11/2006   ALT 56* 06/01/2014   AST 33 06/01/2014   NA 138 09/15/2014   K 4.0 09/15/2014   CL 102 09/15/2014   CREATININE 1.72* 09/15/2014   BUN 20 09/15/2014   CO2 24 09/15/2014   TSH 3.24 11/17/2013   PSA 1.54 06/01/2014   INR 0.95 08/09/2013   HGBA1C 6.8* 09/15/2014   MICROALBUR 42.2* 06/01/2014  Assessment & Plan:

## 2014-11-30 NOTE — Progress Notes (Signed)
Pre visit review using our clinic review tool, if applicable. No additional management support is needed unless otherwise documented below in the visit note. 

## 2014-11-30 NOTE — Assessment & Plan Note (Signed)
Labs On Testosterone

## 2014-11-30 NOTE — Addendum Note (Signed)
Addended by: Cresenciano Lick on: 11/30/2014 12:05 PM   Modules accepted: Orders

## 2014-11-30 NOTE — Assessment & Plan Note (Signed)
5/16 On Wellbutrin, Remeron

## 2014-11-30 NOTE — Assessment & Plan Note (Signed)
Chronic Suboptimal control Losartan-HCT

## 2014-11-30 NOTE — Assessment & Plan Note (Signed)
Labs On Metformin 

## 2014-12-04 ENCOUNTER — Telehealth: Payer: Self-pay | Admitting: *Deleted

## 2014-12-04 NOTE — Telephone Encounter (Signed)
Receive call pt is wanting lab results from 11/30/14...[name]

## 2014-12-05 NOTE — Telephone Encounter (Signed)
Pt has reviewed on mychart...[name]

## 2014-12-05 NOTE — Telephone Encounter (Signed)
Sent via [facility] Thx

## 2014-12-22 ENCOUNTER — Encounter: Payer: Self-pay | Admitting: Internal Medicine

## 2014-12-22 ENCOUNTER — Telehealth: Payer: Self-pay | Admitting: Internal Medicine

## 2014-12-22 ENCOUNTER — Ambulatory Visit (INDEPENDENT_AMBULATORY_CARE_PROVIDER_SITE_OTHER): Payer: No Typology Code available for payment source | Admitting: Internal Medicine

## 2014-12-22 VITALS — BP 128/82 | HR 105 | Temp 98.6°F | Resp 16 | Wt 236.0 lb

## 2014-12-22 DIAGNOSIS — M5412 Radiculopathy, cervical region: Secondary | ICD-10-CM | POA: Diagnosis not present

## 2014-12-22 NOTE — Progress Notes (Signed)
Pre visit review using our clinic review tool, if applicable. No additional management support is needed unless otherwise documented below in the visit note. 

## 2014-12-22 NOTE — Patient Instructions (Addendum)
Use an anti-inflammatory cream such as Aspercreme or Zostrix cream twice a day to the neck as needed. In lieu of this warm moist compresses or  hot water bottle can be used. Do not apply ice . Consider restarting glucosamine sulfate 1500 mg daily for joint symptoms. Take this daily  for 3 months and then leave it off for 2 months. This will rehydrate the cartilages. Use a cervical memory foam pillow to prevent hyperextension or hyperflexion of the cervical spine.

## 2014-12-22 NOTE — Progress Notes (Signed)
   Subjective:    Patient ID: [name], male    DOB: [birth year], 49 y.o.   MRN: [id number]  HPI   This morning upon awakening he began to notice intermittent sharp pain at the occiput related to head position change. He's been unable to identify any triggering position. He also noted some intermittent tingling in the fingertips  in either hand or both. When he touches his face or scalp he feels some tingling as well. He feels as if he's had some "fatigue" in the right upper extremity. He noted a sensation of bugs crawling up the vein of his right medial calf.  He has had a right hip replacement for degenerative joint disease. He will have this done on the left as well. He has a asymmetry of the length of the legs and is getting shoe prostheses to address this.  Glucosamine has been of benefit for his joint symptoms in the past.  He denies constitutional symptoms or other neuromuscular symptoms.    Review of Systems Fever, chills, sweats, or unexplained weight loss not present. Mental status change or memory loss denied. Blurred vision , diplopia or vision loss absent. Vertigo, near syncope or imbalance denied. No loss of control of bladder or bowels. Radicular type pain absent. No seizure stigmata.    Objective:   Physical Exam Pertinent or positive findings include: Pattern alopecia is present. He has a Engineering geologist. There is scarring of the tympanic membrane, greater on the left than the right. There is decreased range of motion of the left hip. Surprisingly there are no significant arthritic changes visible in the hands. He has varicose veins of the lower extremities. There is no cranial nerve deficit. Strength to opposition is excellent in the upper extremities. Romberg and finger-nose testing was normal.  General appearance :adequately nourished; in no distress.  Eyes: No conjunctival inflammation or scleral icterus is present.  Oral exam:  Lips and gums are healthy  appearing.There is no oropharyngeal erythema or exudate noted. Dental hygiene is good.  Heart:  Normal rate and regular rhythm. S1 and S2 normal without gallop, murmur, click, rub or other extra sounds    Lungs:Chest clear to auscultation; no wheezes, rhonchi,rales ,or rubs present.No increased work of breathing.   Abdomen: bowel sounds normal, soft and non-tender without masses, organomegaly or hernias noted.  No guarding or rebound.   Vascular : all pulses equal ; no bruits present.  Skin:Warm & dry.  Intact without suspicious lesions or rashes ; no tenting or jaundice   Lymphatic: No lymphadenopathy is noted about the head, neck, axilla.   Neuro: Strength, tone & DTRs normal.    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  When I went into the room to give him the after visit summary; he stated that he felt he knew what was causing his symptoms. Apparently he braces his head and neck against the head board as he works on his computer which sits on his stomach.

## 2014-12-22 NOTE — Telephone Encounter (Signed)
Patient Name: [name] DOB: 1966-02-02 Initial Comment Caller says has had weird Sx recently; numbness on rt hand and his rt arm feels "restless", and has tingly on rt side of his head and feels like blood running thru his scalp. Today he gets a sharp pain at base on his neck, and felt like bugs running up his vein on his leg. Fingertips on his rt hand are numb Nurse Assessment Nurse: Ronnald Ramp, RN, Miranda Date/Time (Eastern Time): 12/22/2014 2:44:58 PM Confirm and document reason for call. If symptomatic, describe symptoms. ---Caller states he has tingling feelings and numbness in her right hand and arm for about 1 week. Also with tingling in the right side of his face and across his scalp. Today has had a sharp pain at the base of his skull/top of neck when turning his head. Has the patient traveled out of the country within the last 30 days? ---Not Applicable Does the patient require triage? ---Yes Related visit to physician within the last 2 weeks? ---No Does the PT have any chronic conditions? (i.e. diabetes, asthma, etc.) ---Yes List chronic conditions. ---HTN, Diabetes, Bipolar, Depression, Anxiety Guidelines Guideline Title Affirmed Question Affirmed Notes Neurologic Deficit Neck pain (and neurologic deficit) Final Disposition User See Physician within 4 Hours (or PCP triage) Ronnald Ramp, RN, Miranda Comments Appt scheduled for 4 pm today with Dr. Linna Darner. Referrals REFERRED TO PCP OFFICE Disagree/Comply: Comply

## 2015-02-12 ENCOUNTER — Ambulatory Visit (INDEPENDENT_AMBULATORY_CARE_PROVIDER_SITE_OTHER): Payer: No Typology Code available for payment source | Admitting: Internal Medicine

## 2015-02-12 ENCOUNTER — Encounter: Payer: Self-pay | Admitting: Internal Medicine

## 2015-02-12 ENCOUNTER — Other Ambulatory Visit: Payer: No Typology Code available for payment source

## 2015-02-12 VITALS — BP 150/90 | HR 106 | Wt 241.0 lb

## 2015-02-12 DIAGNOSIS — M1611 Unilateral primary osteoarthritis, right hip: Secondary | ICD-10-CM

## 2015-02-12 DIAGNOSIS — Z7721 Contact with and (suspected) exposure to potentially hazardous body fluids: Secondary | ICD-10-CM

## 2015-02-12 DIAGNOSIS — E1165 Type 2 diabetes mellitus with hyperglycemia: Secondary | ICD-10-CM

## 2015-02-12 DIAGNOSIS — M25552 Pain in left hip: Secondary | ICD-10-CM

## 2015-02-12 DIAGNOSIS — F3175 Bipolar disorder, in partial remission, most recent episode depressed: Secondary | ICD-10-CM

## 2015-02-12 DIAGNOSIS — I10 Essential (primary) hypertension: Secondary | ICD-10-CM

## 2015-02-12 DIAGNOSIS — IMO0001 Reserved for inherently not codable concepts without codable children: Secondary | ICD-10-CM

## 2015-02-12 MED ORDER — EMTRICITABINE-TENOFOVIR DF 200-300 MG PO TABS: 1.0000 | ORAL_TABLET | Freq: Every day | ORAL | Status: DC

## 2015-02-12 NOTE — Assessment & Plan Note (Signed)
10/16  [name] has an HIV (+) partner (virus undetectable) - he is worried of exposure risk

## 2015-02-12 NOTE — Assessment & Plan Note (Signed)
Back on Depakote  On Wellbutrin, Remeron Latuda is too $$$

## 2015-02-12 NOTE — Assessment & Plan Note (Signed)
Norco prn  Potential benefits of a long term opioids use as well as potential risks (i.e. addiction risk, apnea etc) and complications (i.e. Somnolence, constipation and others) were explained to the patient and were aknowledged. 

## 2015-02-12 NOTE — Progress Notes (Signed)
Pre visit review using our clinic review tool, if applicable. No additional management support is needed unless otherwise documented below in the visit note. 

## 2015-02-12 NOTE — Assessment & Plan Note (Signed)
R THR s/p

## 2015-02-12 NOTE — Assessment & Plan Note (Signed)
On Metformin 

## 2015-02-12 NOTE — Progress Notes (Signed)
Subjective:  Patient ID: [name], male    DOB: [birth year]  Age: 49 y.o. MRN: [id number]  CC: No chief complaint on file.   HPI LENG QUISPE presents for LBP, HTN, DM f/u. Laray has an HIV (+) partner (virus undetectable) - he is worried of exposure risk  Outpatient Prescriptions Prior to Visit  Medication Sig Dispense Refill  . buPROPion (WELLBUTRIN SR) 150 MG 12 hr tablet Take 1 tablet (150 mg total) by mouth 2 (two) times daily. 60 tablet 5  . cholecalciferol (VITAMIN D) 1000 UNITS tablet Take 1,000 Units by mouth every morning.     . clonazePAM (KLONOPIN) 2 MG tablet Take 1 tablet by mouth 3 (three) times daily.    . cyanocobalamin 500 MCG tablet Take 500 mcg by mouth every morning.     Marland Kitchen losartan-hydrochlorothiazide (HYZAAR) 100-12.5 MG per tablet Take 1 tablet by mouth daily. 90 tablet 3  . metFORMIN (GLUCOPHAGE) 1000 MG tablet Take 1 tablet (1,000 mg total) by mouth 2 (two) times daily with a meal. 60 tablet 11  . mirtazapine (REMERON) 45 MG tablet Take 1 tablet (45 mg total) by mouth at bedtime. 30 tablet 5  . Multiple Vitamin (MULTIVITAMIN WITH MINERALS) TABS Take 1 tablet by mouth every morning.    Marland Kitchen QUEtiapine (SEROQUEL) 300 MG tablet Take 2 tablets by mouth at bedtime.    . tadalafil (CIALIS) 20 MG tablet Take 1 tablet (20 mg total) by mouth daily as needed for erectile dysfunction. 30 tablet 5  . testosterone cypionate (DEPOTESTOTERONE CYPIONATE) 200 MG/ML injection Inject 1.5 mLs (300 mg total) into the muscle every 14 (fourteen) days. 10 mL 5  . traMADol (ULTRAM) 50 MG tablet Take 1 tablet by mouth every 6 (six) hours as needed.    . zolpidem (AMBIEN CR) 12.5 MG CR tablet Take 1 tablet (12.5 mg total) by mouth at bedtime as needed for sleep. 30 tablet 3  . aspirin EC 325 MG EC tablet Take 1 tablet (325 mg total) by mouth 2 (two) times daily after a meal. (Patient not taking: Reported on 02/12/2015) 60 tablet 0  . cyclobenzaprine (FLEXERIL) 10 MG tablet Take 1 tablet by  mouth 3 (three) times daily as needed.    . Lurasidone HCl 20 MG TABS 1 po qd (Patient not taking: Reported on 02/12/2015) 30 tablet 5   No facility-administered medications prior to visit.    ROS Review of Systems  Constitutional: Negative for appetite change, fatigue and unexpected weight change.  HENT: Negative for congestion, nosebleeds, sneezing, sore throat and trouble swallowing.   Eyes: Negative for itching and visual disturbance.  Respiratory: Negative for cough.   Cardiovascular: Negative for chest pain, palpitations and leg swelling.  Gastrointestinal: Negative for nausea, diarrhea, blood in stool and abdominal distention.  Genitourinary: Negative for frequency and hematuria.  Musculoskeletal: Negative for back pain, joint swelling, gait problem and neck pain.  Skin: Negative for rash.  Neurological: Negative for dizziness, tremors, speech difficulty and weakness.  Psychiatric/Behavioral: Negative for suicidal ideas, sleep disturbance, dysphoric mood and agitation. The patient is not nervous/anxious.     Objective:  BP 150/90 mmHg  Pulse 106  Wt 241 lb (109.317 kg)  SpO2 97%  BP Readings from Last 3 Encounters:  02/12/15 150/90  12/22/14 128/82  11/30/14 130/90    Wt Readings from Last 3 Encounters:  02/12/15 241 lb (109.317 kg)  12/22/14 236 lb (107.049 kg)  11/30/14 230 lb (104.327 kg)    Physical Exam  Constitutional: He is oriented to person, place, and time. He appears well-developed. No distress.  NAD  HENT:  Mouth/Throat: Oropharynx is clear and moist.  Eyes: Conjunctivae are normal. Pupils are equal, round, and reactive to light.  Neck: Normal range of motion. No JVD present. No thyromegaly present.  Cardiovascular: Normal rate, regular rhythm, normal heart sounds and intact distal pulses.  Exam reveals no gallop and no friction rub.   No murmur heard. Pulmonary/Chest: Effort normal and breath sounds normal. No respiratory distress. He has no  wheezes. He has no rales. He exhibits no tenderness.  Abdominal: Soft. Bowel sounds are normal. He exhibits no distension and no mass. There is no tenderness. There is no rebound and no guarding.  Musculoskeletal: Normal range of motion. He exhibits no edema or tenderness.  Lymphadenopathy:    He has no cervical adenopathy.  Neurological: He is alert and oriented to person, place, and time. He has normal reflexes. No cranial nerve deficit. He exhibits normal muscle tone. He displays a negative Romberg sign. Coordination and gait normal.  Skin: Skin is warm and dry. No rash noted. No erythema.  Psychiatric: His behavior is normal. Judgment and thought content normal.  anxious RLE is shorter  Lab Results  Component Value Date   WBC 8.3 11/30/2014   HGB 16.7 11/30/2014   HCT 48.0 11/30/2014   PLT 182.0 11/30/2014   GLUCOSE 212* 11/30/2014   CHOL 146 09/15/2014   TRIG 366.0* 09/15/2014   HDL 36.20* 09/15/2014   LDLDIRECT 70.0 09/15/2014   LDLCALC 84 12/11/2006   ALT 41 11/30/2014   AST 29 11/30/2014   NA 138 11/30/2014   K 5.0 11/30/2014   CL 101 11/30/2014   CREATININE 2.12* 11/30/2014   BUN 36* 11/30/2014   CO2 25 11/30/2014   TSH 3.24 11/17/2013   PSA 1.54 06/01/2014   INR 0.95 08/09/2013   HGBA1C 7.8* 11/30/2014   MICROALBUR 42.2* 06/01/2014    No results found.  Assessment & Plan:   Diagnoses and all orders for this visit:  Uncontrolled type 2 diabetes mellitus without complication, without long-term current use of insulin (HCC)  Bipolar disorder, in partial remission, most recent episode depressed ([place])  Exposure to potentially hazardous body fluids -     HIV antibody; Future -     Hepatitis C Antibody; Future -     Hepatitis B Surface AntiBODY; Future -     Hepatitis B Surface AntiGEN; Future -     GC/chlamydia probe amp, genital -     RPR  Left hip pain  Primary osteoarthritis of right hip  Other orders -     Discontinue: emtricitabine-tenofovir  (TRUVADA) 200-300 MG tablet; Take 1 tablet by mouth daily. -     emtricitabine-tenofovir (TRUVADA) 200-300 MG tablet; Take 1 tablet by mouth daily.   I have discontinued Mr. Yust's lurasidone. I am also having him maintain his cyanocobalamin, cholecalciferol, multivitamin with minerals, aspirin, losartan-hydrochlorothiazide, zolpidem, metFORMIN, tadalafil, clonazePAM, cyclobenzaprine, buPROPion, mirtazapine, testosterone cypionate, QUEtiapine, traMADol, divalproex, HYDROcodone-acetaminophen, and emtricitabine-tenofovir.  Meds ordered this encounter  Medications  . divalproex (DEPAKOTE ER) 500 MG 24 hr tablet    Sig: Take 3 tablets by mouth at bedtime.  Marland Kitchen HYDROcodone-acetaminophen (NORCO/VICODIN) 5-325 MG tablet    Sig: Take 1 tablet by mouth daily as needed.  Marland Kitchen DISCONTD: emtricitabine-tenofovir (TRUVADA) 200-300 MG tablet    Sig: Take 1 tablet by mouth daily.    Dispense:  30 tablet    Refill:  11  .  emtricitabine-tenofovir (TRUVADA) 200-300 MG tablet    Sig: Take 1 tablet by mouth daily.    Dispense:  90 tablet    Refill:  3     Follow-up: Return in about 3 months (around 05/15/2015) for a follow-up visit.  [name], MD

## 2015-02-13 ENCOUNTER — Encounter: Payer: Self-pay | Admitting: Internal Medicine

## 2015-02-13 LAB — HEPATITIS B SURFACE ANTIGEN: HEP B S AG: NEGATIVE

## 2015-02-13 LAB — HIV ANTIBODY (ROUTINE TESTING W REFLEX): HIV 1&2 Ab, 4th Generation: NONREACTIVE

## 2015-02-13 LAB — HEPATITIS C ANTIBODY: HCV Ab: NEGATIVE

## 2015-02-13 LAB — HEPATITIS B SURFACE ANTIBODY,QUALITATIVE: Hep B S Ab: NEGATIVE

## 2015-02-13 NOTE — Assessment & Plan Note (Signed)
Losartan HCT 

## 2015-02-15 ENCOUNTER — Encounter: Payer: Self-pay | Admitting: Internal Medicine

## 2015-02-16 ENCOUNTER — Telehealth: Payer: Self-pay

## 2015-02-16 NOTE — Telephone Encounter (Signed)
PA started via cover my meds.   KEY: [id number]

## 2015-02-21 ENCOUNTER — Encounter: Payer: Self-pay | Admitting: *Deleted

## 2015-02-22 ENCOUNTER — Encounter: Payer: Self-pay | Admitting: *Deleted

## 2015-02-22 NOTE — Telephone Encounter (Signed)
Rec fax stating PA is approved from 02/22/15 to 03/24/15. It also states any additional medication may require a separate authorization. Pt informed via MyChart message.

## 2015-02-27 ENCOUNTER — Ambulatory Visit: Payer: Self-pay | Admitting: Internal Medicine

## 2015-03-09 ENCOUNTER — Telehealth: Payer: Self-pay | Admitting: Internal Medicine

## 2015-03-09 NOTE — Telephone Encounter (Signed)
Pt called in and needs to talk to nurse about getting 90 day supply of his meds because he is losing all his ins.  Wants to get enough refills on meds so he can figure out what to do about the ins.

## 2015-03-12 MED ORDER — EMTRICITABINE-TENOFOVIR DF 200-300 MG PO TABS: 1.0000 | ORAL_TABLET | Freq: Every day | ORAL | Status: DC

## 2015-03-12 NOTE — Telephone Encounter (Signed)
I called pt- Truvada refill sent to [facility]. See meds.

## 2015-04-02 ENCOUNTER — Ambulatory Visit: Payer: Self-pay | Admitting: Internal Medicine

## 2015-04-18 ENCOUNTER — Ambulatory Visit (INDEPENDENT_AMBULATORY_CARE_PROVIDER_SITE_OTHER): Payer: No Typology Code available for payment source | Admitting: Internal Medicine

## 2015-04-18 ENCOUNTER — Other Ambulatory Visit (INDEPENDENT_AMBULATORY_CARE_PROVIDER_SITE_OTHER): Payer: No Typology Code available for payment source

## 2015-04-18 ENCOUNTER — Encounter: Payer: Self-pay | Admitting: Internal Medicine

## 2015-04-18 VITALS — BP 120/90 | HR 114 | Wt 228.0 lb

## 2015-04-18 DIAGNOSIS — IMO0001 Reserved for inherently not codable concepts without codable children: Secondary | ICD-10-CM

## 2015-04-18 DIAGNOSIS — M160 Bilateral primary osteoarthritis of hip: Secondary | ICD-10-CM

## 2015-04-18 DIAGNOSIS — E1165 Type 2 diabetes mellitus with hyperglycemia: Secondary | ICD-10-CM

## 2015-04-18 DIAGNOSIS — F3175 Bipolar disorder, in partial remission, most recent episode depressed: Secondary | ICD-10-CM

## 2015-04-18 DIAGNOSIS — Z23 Encounter for immunization: Secondary | ICD-10-CM

## 2015-04-18 DIAGNOSIS — I10 Essential (primary) hypertension: Secondary | ICD-10-CM | POA: Diagnosis not present

## 2015-04-18 LAB — URINALYSIS, ROUTINE W REFLEX MICROSCOPIC
BILIRUBIN URINE: NEGATIVE
LEUKOCYTES UA: NEGATIVE
NITRITE: NEGATIVE
PH: 6 (ref 5.0–8.0)
RBC / HPF: NONE SEEN (ref 0–?)
Specific Gravity, Urine: >=1.03 — AB (ref 1.000–1.030)
TOTAL PROTEIN, URINE-UPE24: 30 — AB
UROBILINOGEN UA: 0.2 (ref 0.0–1.0)
Urine Glucose: >=1000 — AB

## 2015-04-18 LAB — BASIC METABOLIC PANEL
BUN: 27 mg/dL — ABNORMAL HIGH (ref 6–23)
CHLORIDE: 98 meq/L (ref 96–112)
CO2: 25 mEq/L (ref 19–32)
Calcium: 9.9 mg/dL (ref 8.4–10.5)
Creatinine, Ser: 2.44 mg/dL — ABNORMAL HIGH (ref 0.40–1.50)
GFR: 30.37 mL/min — AB (ref >60.00)
GLUCOSE: 172 mg/dL — AB (ref 70–99)
POTASSIUM: 4.4 meq/L (ref 3.5–5.1)
SODIUM: 136 meq/L (ref 135–145)

## 2015-04-18 LAB — HEPATIC FUNCTION PANEL
ALK PHOS: 47 U/L (ref 39–117)
ALT: 46 U/L (ref 0–53)
AST: 43 U/L — ABNORMAL HIGH (ref 0–37)
Albumin: 4.8 g/dL (ref 3.5–5.2)
BILIRUBIN DIRECT: 0.1 mg/dL (ref 0.0–0.3)
TOTAL PROTEIN: 8.2 g/dL (ref 6.0–8.3)
Total Bilirubin: 0.6 mg/dL (ref 0.2–1.2)

## 2015-04-18 LAB — TSH: TSH: 1.55 u[IU]/mL (ref 0.35–4.50)

## 2015-04-18 LAB — CBC WITH DIFFERENTIAL/PLATELET
BASOS ABS: 0 10*3/uL (ref 0.0–0.1)
Basophils Relative: 0.6 % (ref 0.0–3.0)
Eosinophils Absolute: 0 10*3/uL (ref 0.0–0.7)
Eosinophils Relative: 0.6 % (ref 0.0–5.0)
HEMATOCRIT: 51 % (ref 39.0–52.0)
Hemoglobin: 17.3 g/dL — ABNORMAL HIGH (ref 13.0–17.0)
LYMPHS PCT: 31.4 % (ref 12.0–46.0)
Lymphs Abs: 2.2 10*3/uL (ref 0.7–4.0)
MCHC: 33.8 g/dL (ref 30.0–36.0)
MCV: 93.8 fl (ref 78.0–100.0)
MONOS PCT: 9.9 % (ref 3.0–12.0)
Monocytes Absolute: 0.7 10*3/uL (ref 0.1–1.0)
Neutro Abs: 4 10*3/uL (ref 1.4–7.7)
Neutrophils Relative %: 57.5 % (ref 43.0–77.0)
Platelets: 161 10*3/uL (ref 150.0–400.0)
RBC: 5.44 Mil/uL (ref 4.22–5.81)
RDW: 15.9 % — ABNORMAL HIGH (ref 11.5–15.5)
WBC: 6.9 10*3/uL (ref 4.0–10.5)

## 2015-04-18 LAB — MICROALBUMIN / CREATININE URINE RATIO
CREATININE, U: 250.2 mg/dL
MICROALB/CREAT RATIO: 6.3 mg/g (ref 0.0–30.0)
Microalb, Ur: 15.8 mg/dL — ABNORMAL HIGH (ref 0.0–1.9)

## 2015-04-18 LAB — LIPID PANEL
CHOL/HDL RATIO: 7
Cholesterol: 224 mg/dL — ABNORMAL HIGH (ref 0–200)
HDL: 30.1 mg/dL — AB (ref >39.00)
Triglycerides: 729 mg/dL — ABNORMAL HIGH (ref 0.0–149.0)

## 2015-04-18 LAB — VITAMIN D 25 HYDROXY (VIT D DEFICIENCY, FRACTURES): VITD: 82.56 ng/mL (ref 30.00–100.00)

## 2015-04-18 LAB — PSA: PSA: 2.46 ng/mL (ref 0.10–4.00)

## 2015-04-18 LAB — HEMOGLOBIN A1C: Hgb A1c MFr Bld: 6.8 % — ABNORMAL HIGH (ref 4.6–6.5)

## 2015-04-18 LAB — LDL CHOLESTEROL, DIRECT: Direct LDL: 102 mg/dL

## 2015-04-18 LAB — TESTOSTERONE: TESTOSTERONE: 649.06 ng/dL (ref 300.00–890.00)

## 2015-04-18 MED ORDER — EMTRICITABINE-TENOFOVIR DF 200-300 MG PO TABS
1.0000 | ORAL_TABLET | Freq: Every day | ORAL | Status: DC
Start: 2015-04-18 — End: 2015-10-12

## 2015-04-18 NOTE — Assessment & Plan Note (Signed)
Depakote On Wellbutrin, Remeron

## 2015-04-18 NOTE — Progress Notes (Signed)
Subjective:  Patient ID: [name], male    DOB: November 18, 1965  Age: 49 y.o. MRN: [id number]  CC: No chief complaint on file.   HPI [name] presents for OA, LBP, DM f/u. Pt lost wt on diet  Outpatient Prescriptions Prior to Visit  Medication Sig Dispense Refill  . cholecalciferol (VITAMIN D) 1000 UNITS tablet Take 1,000 Units by mouth every morning.     . clonazePAM (KLONOPIN) 2 MG tablet Take 1 tablet by mouth 3 (three) times daily.    . cyanocobalamin 500 MCG tablet Take 500 mcg by mouth every morning.     . divalproex (DEPAKOTE ER) 500 MG 24 hr tablet Take 3 tablets by mouth at bedtime.    Marland Kitchen HYDROcodone-acetaminophen (NORCO/VICODIN) 5-325 MG tablet Take 1 tablet by mouth daily as needed.    Marland Kitchen losartan-hydrochlorothiazide (HYZAAR) 100-12.5 MG per tablet Take 1 tablet by mouth daily. 90 tablet 3  . metFORMIN (GLUCOPHAGE) 1000 MG tablet Take 1 tablet (1,000 mg total) by mouth 2 (two) times daily with a meal. 60 tablet 11  . mirtazapine (REMERON) 45 MG tablet Take 1 tablet (45 mg total) by mouth at bedtime. 30 tablet 5  . Multiple Vitamin (MULTIVITAMIN WITH MINERALS) TABS Take 1 tablet by mouth every morning.    . tadalafil (CIALIS) 20 MG tablet Take 1 tablet (20 mg total) by mouth daily as needed for erectile dysfunction. 30 tablet 5  . testosterone cypionate (DEPOTESTOTERONE CYPIONATE) 200 MG/ML injection Inject 1.5 mLs (300 mg total) into the muscle every 14 (fourteen) days. 10 mL 5  . buPROPion (WELLBUTRIN SR) 150 MG 12 hr tablet Take 1 tablet (150 mg total) by mouth 2 (two) times daily. 60 tablet 5  . emtricitabine-tenofovir (TRUVADA) 200-300 MG tablet Take 1 tablet by mouth daily. 90 tablet 0  . QUEtiapine (SEROQUEL) 300 MG tablet Take 2 tablets by mouth at bedtime.    Marland Kitchen aspirin EC 325 MG EC tablet Take 1 tablet (325 mg total) by mouth 2 (two) times daily after a meal. (Patient not taking: Reported on 04/18/2015) 60 tablet 0  . cyclobenzaprine (FLEXERIL) 10 MG tablet Take 1  tablet by mouth 3 (three) times daily as needed. Reported on 04/18/2015    . traMADol (ULTRAM) 50 MG tablet Take 1 tablet by mouth every 6 (six) hours as needed. Reported on 04/18/2015    . zolpidem (AMBIEN CR) 12.5 MG CR tablet Take 1 tablet (12.5 mg total) by mouth at bedtime as needed for sleep. (Patient not taking: Reported on 04/18/2015) 30 tablet 3   No facility-administered medications prior to visit.    ROS Review of Systems  Constitutional: Negative for appetite change, fatigue and unexpected weight change.  HENT: Negative for congestion, nosebleeds, sneezing, sore throat and trouble swallowing.   Eyes: Negative for itching and visual disturbance.  Respiratory: Negative for cough.   Cardiovascular: Negative for chest pain, palpitations and leg swelling.  Gastrointestinal: Negative for nausea, diarrhea, blood in stool and abdominal distention.  Genitourinary: Negative for frequency and hematuria.  Musculoskeletal: Positive for back pain, arthralgias and gait problem. Negative for joint swelling and neck pain.  Skin: Negative for rash.  Neurological: Negative for dizziness, tremors, speech difficulty and weakness.  Psychiatric/Behavioral: Negative for suicidal ideas, sleep disturbance, dysphoric mood and agitation. The patient is not nervous/anxious.     Objective:  BP 120/90 mmHg  Pulse 114  Wt 228 lb (103.42 kg)  SpO2 97%  BP Readings from Last 3 Encounters:  04/18/15  120/90  02/12/15 150/90  12/22/14 128/82    Wt Readings from Last 3 Encounters:  04/18/15 228 lb (103.42 kg)  02/12/15 241 lb (109.317 kg)  12/22/14 236 lb (107.049 kg)    Physical Exam  Constitutional: He is oriented to person, place, and time. He appears well-developed. No distress.  NAD  HENT:  Mouth/Throat: Oropharynx is clear and moist.  Eyes: Conjunctivae are normal. Pupils are equal, round, and reactive to light.  Neck: Normal range of motion. No JVD present. No thyromegaly present.    Cardiovascular: Normal rate, regular rhythm, normal heart sounds and intact distal pulses.  Exam reveals no gallop and no friction rub.   No murmur heard. Pulmonary/Chest: Effort normal and breath sounds normal. No respiratory distress. He has no wheezes. He has no rales. He exhibits no tenderness.  Abdominal: Soft. Bowel sounds are normal. He exhibits no distension and no mass. There is no tenderness. There is no rebound and no guarding.  Musculoskeletal: Normal range of motion. He exhibits tenderness. He exhibits no edema.  Lymphadenopathy:    He has no cervical adenopathy.  Neurological: He is alert and oriented to person, place, and time. He has normal reflexes. No cranial nerve deficit. He exhibits normal muscle tone. He displays a negative Romberg sign. Coordination and gait normal.  Skin: Skin is warm and dry. No rash noted.  Psychiatric: He has a normal mood and affect. His behavior is normal. Judgment and thought content normal.    Lab Results  Component Value Date   WBC 6.9 04/18/2015   HGB 17.3* 04/18/2015   HCT 51.0 04/18/2015   PLT 161.0 04/18/2015   GLUCOSE 172* 04/18/2015   CHOL 224* 04/18/2015   TRIG * 04/18/2015    729.0 Triglyceride is over 400; calculations on Lipids are invalid.   HDL 30.10* 04/18/2015   LDLDIRECT 102.0 04/18/2015   LDLCALC 84 12/11/2006   ALT 46 04/18/2015   AST 43* 04/18/2015   NA 136 04/18/2015   K 4.4 04/18/2015   CL 98 04/18/2015   CREATININE 2.44* 04/18/2015   BUN 27* 04/18/2015   CO2 25 04/18/2015   TSH 1.55 04/18/2015   PSA 2.46 04/18/2015   INR 0.95 08/09/2013   HGBA1C 6.8* 04/18/2015   MICROALBUR 15.8* 04/18/2015    No results found.  Assessment & Plan:   Diagnoses and all orders for this visit:  Essential hypertension -     CBC with Differential/Platelet; Future -     Basic metabolic panel; Future -     Hemoglobin A1c; Future -     Hepatic function panel; Future -     Lipid panel; Future -     PSA; Future -      Testosterone; Future -     TSH; Future -     Urinalysis; Future -     VITAMIN D 25 Hydroxy (Vit-D Deficiency, Fractures); Future  Uncontrolled type 2 diabetes mellitus without complication, without long-term current use of insulin (HCC) -     CBC with Differential/Platelet; Future -     Basic metabolic panel; Future -     Hemoglobin A1c; Future -     Hepatic function panel; Future -     Lipid panel; Future -     PSA; Future -     Testosterone; Future -     TSH; Future -     Urinalysis; Future -     VITAMIN D 25 Hydroxy (Vit-D Deficiency, Fractures); Future -  Microalbumin / creatinine urine ratio; Future  Primary osteoarthritis of both hips -     CBC with Differential/Platelet; Future -     Basic metabolic panel; Future -     Hemoglobin A1c; Future -     Hepatic function panel; Future -     Lipid panel; Future -     PSA; Future -     Testosterone; Future -     TSH; Future -     Urinalysis; Future -     VITAMIN D 25 Hydroxy (Vit-D Deficiency, Fractures); Future  Bipolar disorder, in partial remission, most recent episode depressed ([place]) -     CBC with Differential/Platelet; Future -     Basic metabolic panel; Future -     Hemoglobin A1c; Future -     Hepatic function panel; Future -     Lipid panel; Future -     PSA; Future -     Testosterone; Future -     TSH; Future -     Urinalysis; Future -     VITAMIN D 25 Hydroxy (Vit-D Deficiency, Fractures); Future  Need for prophylactic vaccination against Streptococcus pneumoniae (pneumococcus) -     Pneumococcal conjugate vaccine 13-valent IM  Other orders -     emtricitabine-tenofovir (TRUVADA) 200-300 MG tablet; Take 1 tablet by mouth daily.   I am having Mr. Mertz maintain his cyanocobalamin, cholecalciferol, multivitamin with minerals, aspirin, losartan-hydrochlorothiazide, zolpidem, metFORMIN, tadalafil, clonazePAM, cyclobenzaprine, mirtazapine, testosterone cypionate, traMADol, divalproex,  HYDROcodone-acetaminophen, buPROPion, QUEtiapine, and emtricitabine-tenofovir.  Meds ordered this encounter  Medications  . buPROPion (WELLBUTRIN XL) 300 MG 24 hr tablet    Sig: Take 1 tablet by mouth daily.  . QUEtiapine (SEROQUEL) 400 MG tablet    Sig: Take 1 tablet by mouth 2 (two) times daily.  Marland Kitchen emtricitabine-tenofovir (TRUVADA) 200-300 MG tablet    Sig: Take 1 tablet by mouth daily.    Dispense:  90 tablet    Refill:  3    This is # 90 for a 90 day supply.     Follow-up: Return in about 3 months (around 07/17/2015) for a follow-up visit.  [name], MD

## 2015-04-18 NOTE — Assessment & Plan Note (Signed)
L hip OA LBP Dr Rhona Raider

## 2015-04-18 NOTE — Progress Notes (Signed)
Pre visit review using our clinic review tool, if applicable. No additional management support is needed unless otherwise documented below in the visit note. 

## 2015-04-18 NOTE — Assessment & Plan Note (Signed)
Losartan-HCT Labs

## 2015-04-18 NOTE — Assessment & Plan Note (Signed)
Metformin 

## 2015-04-20 MED ORDER — ICOSAPENT ETHYL 1 G PO CAPS: 2.0000 | ORAL_CAPSULE | Freq: Two times a day (BID) | ORAL | Status: DC

## 2015-04-26 ENCOUNTER — Telehealth: Payer: Self-pay

## 2015-04-26 NOTE — Telephone Encounter (Signed)
PA received from [facility]. Insurance ID listed is different from what we have on file.  Faxed request back to pharmacy requesting verification of ID and also request for BIN, PCN, and RxGrp

## 2015-05-02 ENCOUNTER — Other Ambulatory Visit: Payer: Self-pay | Admitting: Orthopaedic Surgery

## 2015-05-02 ENCOUNTER — Telehealth: Payer: Self-pay

## 2015-05-02 NOTE — Telephone Encounter (Signed)
PA initiated vis covermymeds. Key for PA is [id number]

## 2015-05-07 NOTE — Telephone Encounter (Signed)
Patient does not have rx coverage through [facility]. Not able to process PA.  PA has been deleted.   LVM for pt to call back as soon as possible.  RE: I need to clarify the rx coverage that pt has. PA is not able to be processed until we have the correct rx coverage information.

## 2015-05-11 ENCOUNTER — Encounter (HOSPITAL_COMMUNITY)
Admission: RE | Admit: 2015-05-11 | Discharge: 2015-05-11 | Disposition: A | Discharge status: Home / Self Care | Payer: BLUE CROSS/BLUE SHIELD | Source: Ambulatory Visit | Attending: Orthopaedic Surgery | Admitting: Orthopaedic Surgery

## 2015-05-11 ENCOUNTER — Other Ambulatory Visit: Payer: Self-pay | Admitting: *Deleted

## 2015-05-11 ENCOUNTER — Ambulatory Visit (HOSPITAL_COMMUNITY)
Admission: RE | Admit: 2015-05-11 | Discharge: 2015-05-11 | Disposition: A | Discharge status: Home / Self Care | Payer: BLUE CROSS/BLUE SHIELD | Source: Ambulatory Visit | Attending: Orthopaedic Surgery | Admitting: Orthopaedic Surgery

## 2015-05-11 ENCOUNTER — Encounter (HOSPITAL_COMMUNITY): Payer: Self-pay

## 2015-05-11 DIAGNOSIS — Z01818 Encounter for other preprocedural examination: Secondary | ICD-10-CM | POA: Insufficient documentation

## 2015-05-11 DIAGNOSIS — R000 Tachycardia, unspecified: Secondary | ICD-10-CM | POA: Diagnosis not present

## 2015-05-11 DIAGNOSIS — J984 Other disorders of lung: Secondary | ICD-10-CM | POA: Insufficient documentation

## 2015-05-11 DIAGNOSIS — R9431 Abnormal electrocardiogram [ECG] [EKG]: Secondary | ICD-10-CM | POA: Insufficient documentation

## 2015-05-11 DIAGNOSIS — Z01812 Encounter for preprocedural laboratory examination: Secondary | ICD-10-CM | POA: Diagnosis not present

## 2015-05-11 DIAGNOSIS — Z01810 Encounter for preprocedural cardiovascular examination: Secondary | ICD-10-CM | POA: Insufficient documentation

## 2015-05-11 HISTORY — DX: Post-traumatic stress disorder, unspecified: F43.10

## 2015-05-11 HISTORY — DX: Other specified abnormal findings of blood chemistry: R79.89

## 2015-05-11 HISTORY — DX: Personal history of urinary calculi: Z87.442

## 2015-05-11 HISTORY — DX: Reserved for concepts with insufficient information to code with codable children: IMO0002

## 2015-05-11 HISTORY — DX: Fatty (change of) liver, not elsewhere classified: K76.0

## 2015-05-11 LAB — URINE MICROSCOPIC-ADD ON

## 2015-05-11 LAB — SURGICAL PCR SCREEN
MRSA, PCR: NEGATIVE
STAPHYLOCOCCUS AUREUS: POSITIVE — AB

## 2015-05-11 LAB — BASIC METABOLIC PANEL
ANION GAP: 12 (ref 5–15)
BUN: 24 mg/dL — ABNORMAL HIGH (ref 6–20)
CHLORIDE: 104 mmol/L (ref 101–111)
CO2: 25 mmol/L (ref 22–32)
CREATININE: 2.34 mg/dL — AB (ref 0.61–1.24)
Calcium: 10 mg/dL (ref 8.9–10.3)
GFR calc non Af Amer: 31 mL/min — ABNORMAL LOW (ref >60)
GFR, EST AFRICAN AMERICAN: 36 mL/min — AB (ref >60)
Glucose, Bld: 169 mg/dL — ABNORMAL HIGH (ref 65–99)
POTASSIUM: 4.6 mmol/L (ref 3.5–5.1)
SODIUM: 141 mmol/L (ref 135–145)

## 2015-05-11 LAB — TYPE AND SCREEN
ABO/RH(D): A POS
ANTIBODY SCREEN: NEGATIVE

## 2015-05-11 LAB — CBC WITH DIFFERENTIAL/PLATELET
Basophils Absolute: 0 10*3/uL (ref 0.0–0.1)
Basophils Relative: 0 %
EOS ABS: 0.1 10*3/uL (ref 0.0–0.7)
Eosinophils Relative: 1 %
HEMATOCRIT: 45.2 % (ref 39.0–52.0)
HEMOGLOBIN: 15.9 g/dL (ref 13.0–17.0)
LYMPHS ABS: 2.5 10*3/uL (ref 0.7–4.0)
LYMPHS PCT: 28 %
MCH: 32.8 pg (ref 26.0–34.0)
MCHC: 35.2 g/dL (ref 30.0–36.0)
MCV: 93.2 fL (ref 78.0–100.0)
MONOS PCT: 10 %
Monocytes Absolute: 0.9 10*3/uL (ref 0.1–1.0)
NEUTROS ABS: 5.4 10*3/uL (ref 1.7–7.7)
NEUTROS PCT: 61 %
Platelets: 133 10*3/uL — ABNORMAL LOW (ref 150–400)
RBC: 4.85 MIL/uL (ref 4.22–5.81)
RDW: 15 % (ref 11.5–15.5)
WBC: 8.9 10*3/uL (ref 4.0–10.5)

## 2015-05-11 LAB — URINALYSIS, ROUTINE W REFLEX MICROSCOPIC
BILIRUBIN URINE: NEGATIVE
KETONES UR: 15 mg/dL — AB
Leukocytes, UA: NEGATIVE
NITRITE: NEGATIVE
PH: 5.5 (ref 5.0–8.0)
PROTEIN: 30 mg/dL — AB
Specific Gravity, Urine: 1.034 — ABNORMAL HIGH (ref 1.005–1.030)

## 2015-05-11 LAB — PROTIME-INR
INR: 1.13 (ref 0.00–1.49)
PROTHROMBIN TIME: 14.7 s (ref 11.6–15.2)

## 2015-05-11 LAB — APTT: aPTT: 26 seconds (ref 24–37)

## 2015-05-11 LAB — GLUCOSE, CAPILLARY: GLUCOSE-CAPILLARY: 199 mg/dL — AB (ref 65–99)

## 2015-05-11 MED ORDER — LOSARTAN POTASSIUM-HCTZ 100-12.5 MG PO TABS: 1.0000 | ORAL_TABLET | Freq: Every day | ORAL | Status: DC

## 2015-05-11 NOTE — Pre-Procedure Instructions (Signed)
DARDEN VANNESS  05/11/2015      [facility] - [place], [place] - [address] [place] [address] Phone: [phone] Fax: [phone]  [place], [place] [address] Phone: [phone] Fax: [phone]    Your procedure is scheduled on   Tuesday  05/22/15  Report to [facility] Admitting at 800 A.M.  Call this number if you have problems the morning of surgery:  [phone]   Remember:  Do not eat food or drink liquids after midnight.  Take these medicines the morning of surgery with A SIP OF WATER   BUPROPION(WELLBUTRIN), CLONAZEPAM IF NEEDED, HYDROCODONE IF NEEDED, SEROQUEL (QUETIAPINE)     (STOP ASPIRIN, MULTIVITAMIN/ VITAMINS, HERBAL MEDICINES) How to Manage Your Diabetes Before Surgery   Why is it important to control my blood sugar before and after surgery?   Improving blood sugar levels before and after surgery helps healing and can limit problems.  A way of improving blood sugar control is eating a healthy diet by:  - Eating less sugar and carbohydrates  - Increasing activity/exercise  - Talk with your doctor about reaching your blood sugar goals  High blood sugars (greater than 180 mg/dL) can raise your risk of infections and slow down your recovery so you will need to focus on controlling your diabetes during the weeks before surgery.  Make sure that the doctor who takes care of your diabetes knows about your planned surgery including the date and location.  How do I manage my blood sugars before surgery?   Check your blood sugar at least 4 times a day, 2 days before surgery to make sure that they are not too high or low.   Check your blood sugar the morning of your surgery when you wake up and every 2               hours until you get to the Short-Stay unit.  If your blood sugar is less  than 70 mg/dL, you will need to treat for low blood sugar by:  Treat a low blood sugar (less than 70 mg/dL) with 1/2 cup of clear juice (cranberry or apple), 4 glucose tablets, OR glucose gel.  Recheck blood sugar in 15 minutes after treatment (to make sure it is greater than 70 mg/dL).  If blood sugar is not greater than 70 mg/dL on re-check, call [phone] for further instructions.   Report your blood sugar to the Short-Stay nurse when you get to Short-Stay.  References:  [facility], 2007 "How to Manage your Diabetes Before and After Surgery".  What do I do about my diabetes medications?   Do not take oral diabetes medicines (pills) the morning of surger   For patients with "Insulin Pumps":  Contact your diabetes doctor for specific instructions before surgery.   Decrease basal insulin rates by 20% at midnight the night before surgery.  Note that if your surgery is planned to be longer than 2 hours, your insulin pump will be removed and intravenous (IV) insulin will be started and managed by the nurses and anesthesiologist.  You will be able to restart your insulin pump once you are awake and able to manage it.  Make sure to bring insulin pump supplies to the hospital with you in case your  site needs to be changed.        Do not wear jewelry, make-up or nail polish.  Do not wear lotions, powders, or perfumes.  You may wear deodorant.  Do not shave 48 hours prior to surgery.  Men may shave face and neck.  Do not bring valuables to the hospital.  [facility] is not responsible for any belongings or valuables.  Contacts, dentures or bridgework may not be worn into surgery.  Leave your suitcase in the car.  After surgery it may be brought to your room.  For patients admitted to the hospital, discharge time will be determined by your treatment team.  Patients discharged the day of surgery will not be allowed to drive home.   Name and phone number  of your driver:    Special instructions:  SEE PREPARING FOR SURGERY  Please read over the following fact sheets that you were given. Pain Booklet, Coughing and Deep Breathing, Blood Transfusion Information, MRSA Information and Surgical Site Infection Prevention

## 2015-05-11 NOTE — Progress Notes (Signed)
Mupirocin Ointment Rx called into [facility] in [place] for positive PCR of Staph. Left message on pt's voicemail informing him of results and need to pick up Rx.

## 2015-05-21 MED ORDER — CHLORHEXIDINE GLUCONATE 4 % EX LIQD: 60.0000 mL | Freq: Once | CUTANEOUS | Status: DC

## 2015-05-21 MED ORDER — DEXTROSE 5 % IV SOLN
3.0000 g | INTRAVENOUS | Status: AC
  Administered 2015-05-22: 3 g via INTRAVENOUS
  Filled 2015-05-21: qty 3000

## 2015-05-21 MED ORDER — LACTATED RINGERS IV SOLN: INTRAVENOUS | Status: DC

## 2015-05-21 NOTE — H&P (Signed)
TOTAL HIP ADMISSION H&P  Patient is admitted for left total hip arthroplasty.  Subjective:  Chief Complaint: left hip pain  HPI: [name], 50 y.o. male, has a history of pain and functional disability in the left hip(s) due to arthritis and patient has failed non-surgical conservative treatments for greater than 12 weeks to include NSAID's and/or analgesics, flexibility and strengthening excercises, supervised PT with diminished ADL's post treatment, use of assistive devices, weight reduction as appropriate and activity modification.  Onset of symptoms was gradual starting 5 years ago with gradually worsening course since that time.The patient noted no past surgery on the left hip(s).  Patient currently rates pain in the left hip at 10 out of 10 with activity. Patient has night pain, worsening of pain with activity and weight bearing, trendelenberg gait, pain that interfers with activities of daily living and crepitus. Patient has evidence of subchondral cysts, subchondral sclerosis, periarticular osteophytes and joint space narrowing by imaging studies. This condition presents safety issues increasing the risk of falls. There is no current active infection.  Patient Active Problem List   Diagnosis Date Noted  . Exposure to potentially hazardous body fluids 02/12/2015  . Loss of weight 09/04/2014  . Dyslipidemia 05/31/2014  . Left hip pain 02/13/2014  . Insomnia 11/11/2013  . Degenerative joint disease (DJD) of hip 08/16/2013  . Acute respiratory failure ([place]) 11/02/2012  . Pneumonia, organism unspecified 11/02/2012  . Pleural effusion 11/02/2012  . Hypoxemia 11/02/2012  . HCAP (healthcare-associated pneumonia) 10/31/2012  . Anemia 10/31/2012  . Osteoarthritis of right hip 11/28/2011  . Right hip pain 07/22/2011  . Bilateral hip pain 07/22/2011  . LBP (low back pain) 05/20/2011  . Testicular pain, right 05/20/2011  . Erectile dysfunction 02/17/2011  . Constipation - functional  02/17/2011  . Diabetes type 2, uncontrolled ([place]) 11/06/2010  . Hypogonadism male 05/20/2010  . TOBACCO USER 05/20/2010  . Demoralization and apathy 05/20/2010  . Bipolar disorder ([place]) 01/18/2010  . DEPRESSION 01/18/2010  . MICROSCOPIC HEMATURIA 01/18/2010  . SHOULDER PAIN 01/18/2010  . Anxiety state 12/05/2006  . Essential hypertension 12/05/2006   Past Medical History  Diagnosis Date  . Anxiety   . Depression     bipolar [facility]  . Hypogonadism male   . ADD (attention deficit disorder)   . Microscopic hematuria     hereditary s/p Urology eval  . Bipolar 1 disorder ([place])   . Arthritis     right hip  . Family history of anesthesia complication     pt is unsure , but pt father may have been difficult to arouse   . Pneumonia 10-2012  . Blood in urine   . Hypertension   . PTSD (post-traumatic stress disorder)     SOCIAL ANXIETY DISORDER   . History of kidney stones   . Creatinine elevation   . Liver fatty degeneration     Past Surgical History  Procedure Laterality Date  . Lumbar disc surgery    . Back surgery    . Closed reduction metacarpal with percutaneous pinning Right   . Tonsillectomy    . Total hip arthroplasty Right 08/16/2013    Procedure: TOTAL HIP ARTHROPLASTY ANTERIOR APPROACH;  Surgeon: Hessie Dibble, MD;  Location: [facility];  Service: Orthopedics;  Laterality: Right;    No prescriptions prior to admission   No Known Allergies  Social History  Substance Use Topics  . Smoking status: Former Smoker -- 25 years    Types: Cigarettes  . Smokeless  tobacco: Never Used  . Alcohol Use: No    Family History  Problem Relation Age of Onset  . Diabetes Father   . Cancer Mother     died of melanoma with mets  . Cervical cancer Sister   . Diabetes Sister      Review of Systems  Musculoskeletal: Positive for joint pain.       Left hip  All other systems reviewed and are negative.   Objective:  Physical Exam  Constitutional: He is oriented to  person, place, and time. He appears well-developed and well-nourished.  HENT:  Head: Normocephalic and atraumatic.  Eyes: Pupils are equal, round, and reactive to light.  Neck: Normal range of motion.  Cardiovascular: Normal rate and regular rhythm.   Respiratory: Effort normal.  GI: Soft.  Musculoskeletal:  He walks with an unsteady gait using a cane. Motion of the left hip is extremely painful especially in internal rotation. His leg lengths grossly look equal. Sensation and motor function are intact in his feet with palpable pulses on both sides. There is no palpable lymphadenopathy about his groin.   Neurological: He is alert and oriented to person, place, and time.  Skin: Skin is warm and dry.  Psychiatric: He has a normal mood and affect. His behavior is normal. Judgment and thought content normal.    Vital signs in last 24 hours:    Labs:   Estimated body mass index is 27.76 kg/(m^2) as calculated from the following:   Height as of 08/16/13: 6\' 4"  (1.93 m).   Weight as of 04/18/15: 103.42 kg (228 lb).   Imaging Review Plain radiographs demonstrate severe degenerative joint disease of the left hip(s). The bone quality appears to be good for age and reported activity level.  Assessment/Plan:  End stage primary arthritis, left hip(s)  The patient history, physical examination, clinical judgement of the provider and imaging studies are consistent with end stage degenerative joint disease of the left hip(s) and total hip arthroplasty is deemed medically necessary. The treatment options including medical management, injection therapy, arthroscopy and arthroplasty were discussed at length. The risks and benefits of total hip arthroplasty were presented and reviewed. The risks due to aseptic loosening, infection, stiffness, dislocation/subluxation,  thromboembolic complications and other imponderables were discussed.  The patient acknowledged the explanation, agreed to proceed with the  plan and consent was signed. Patient is being admitted for inpatient treatment for surgery, pain control, PT, OT, prophylactic antibiotics, VTE prophylaxis, progressive ambulation and ADL's and discharge planning.The patient is planning to be discharged home with home health services

## 2015-05-22 ENCOUNTER — Inpatient Hospital Stay (HOSPITAL_COMMUNITY): Payer: BLUE CROSS/BLUE SHIELD | Admitting: Certified Registered Nurse Anesthetist

## 2015-05-22 ENCOUNTER — Encounter (HOSPITAL_COMMUNITY): Payer: Self-pay | Admitting: Certified Registered Nurse Anesthetist

## 2015-05-22 ENCOUNTER — Inpatient Hospital Stay (HOSPITAL_COMMUNITY)
Admission: RE | Admit: 2015-05-22 | Discharge: 2015-05-24 | DRG: 470 | Disposition: A | Discharge status: Home / Self Care | Payer: BLUE CROSS/BLUE SHIELD | Source: Ambulatory Visit | Attending: Orthopaedic Surgery | Admitting: Orthopaedic Surgery

## 2015-05-22 ENCOUNTER — Encounter (HOSPITAL_COMMUNITY)
Admission: RE | Disposition: A | Discharge status: Home / Self Care | Payer: Self-pay | Source: Ambulatory Visit | Attending: Orthopaedic Surgery

## 2015-05-22 ENCOUNTER — Inpatient Hospital Stay (HOSPITAL_COMMUNITY): Payer: BLUE CROSS/BLUE SHIELD

## 2015-05-22 DIAGNOSIS — M1612 Unilateral primary osteoarthritis, left hip: Secondary | ICD-10-CM | POA: Diagnosis present

## 2015-05-22 DIAGNOSIS — K5900 Constipation, unspecified: Secondary | ICD-10-CM | POA: Diagnosis present

## 2015-05-22 DIAGNOSIS — F319 Bipolar disorder, unspecified: Secondary | ICD-10-CM | POA: Diagnosis present

## 2015-05-22 DIAGNOSIS — Z7982 Long term (current) use of aspirin: Secondary | ICD-10-CM

## 2015-05-22 DIAGNOSIS — E785 Hyperlipidemia, unspecified: Secondary | ICD-10-CM | POA: Diagnosis present

## 2015-05-22 DIAGNOSIS — Z419 Encounter for procedure for purposes other than remedying health state, unspecified: Secondary | ICD-10-CM

## 2015-05-22 DIAGNOSIS — F4310 Post-traumatic stress disorder, unspecified: Secondary | ICD-10-CM | POA: Diagnosis present

## 2015-05-22 DIAGNOSIS — F411 Generalized anxiety disorder: Secondary | ICD-10-CM | POA: Diagnosis present

## 2015-05-22 DIAGNOSIS — E119 Type 2 diabetes mellitus without complications: Secondary | ICD-10-CM | POA: Diagnosis present

## 2015-05-22 DIAGNOSIS — Z833 Family history of diabetes mellitus: Secondary | ICD-10-CM | POA: Diagnosis not present

## 2015-05-22 DIAGNOSIS — Z8049 Family history of malignant neoplasm of other genital organs: Secondary | ICD-10-CM | POA: Diagnosis not present

## 2015-05-22 DIAGNOSIS — F4010 Social phobia, unspecified: Secondary | ICD-10-CM | POA: Diagnosis present

## 2015-05-22 DIAGNOSIS — Z7984 Long term (current) use of oral hypoglycemic drugs: Secondary | ICD-10-CM | POA: Diagnosis not present

## 2015-05-22 DIAGNOSIS — Z96641 Presence of right artificial hip joint: Secondary | ICD-10-CM | POA: Diagnosis present

## 2015-05-22 DIAGNOSIS — Z808 Family history of malignant neoplasm of other organs or systems: Secondary | ICD-10-CM | POA: Diagnosis not present

## 2015-05-22 DIAGNOSIS — Z87891 Personal history of nicotine dependence: Secondary | ICD-10-CM | POA: Diagnosis not present

## 2015-05-22 DIAGNOSIS — I10 Essential (primary) hypertension: Secondary | ICD-10-CM | POA: Diagnosis present

## 2015-05-22 HISTORY — PX: TOTAL HIP ARTHROPLASTY: SHX124

## 2015-05-22 HISTORY — DX: Type 2 diabetes mellitus without complications: E11.9

## 2015-05-22 HISTORY — DX: Unilateral primary osteoarthritis, left hip: M16.12

## 2015-05-22 LAB — GLUCOSE, CAPILLARY
GLUCOSE-CAPILLARY: 112 mg/dL — AB (ref 65–99)
Glucose-Capillary: 128 mg/dL — ABNORMAL HIGH (ref 65–99)

## 2015-05-22 SURGERY — ARTHROPLASTY, HIP, TOTAL, ANTERIOR APPROACH
Anesthesia: Spinal | Site: Hip | Laterality: Left

## 2015-05-22 MED ORDER — BUPIVACAINE LIPOSOME 1.3 % IJ SUSP
20.0000 mL | INTRAMUSCULAR | Status: DC
  Filled 2015-05-22: qty 20

## 2015-05-22 MED ORDER — ONDANSETRON HCL 4 MG/2ML IJ SOLN
INTRAMUSCULAR | Status: DC | PRN
Start: 2015-05-22 — End: 2015-05-22
  Administered 2015-05-22: 4 mg via INTRAVENOUS

## 2015-05-22 MED ORDER — ACETAMINOPHEN 650 MG RE SUPP: 650.0000 mg | Freq: Four times a day (QID) | RECTAL | Status: DC | PRN

## 2015-05-22 MED ORDER — MIDAZOLAM HCL 2 MG/2ML IJ SOLN
INTRAMUSCULAR | Status: AC
  Filled 2015-05-22: qty 2

## 2015-05-22 MED ORDER — LOSARTAN POTASSIUM-HCTZ 100-12.5 MG PO TABS: 1.0000 | ORAL_TABLET | Freq: Every day | ORAL | Status: DC

## 2015-05-22 MED ORDER — LACTATED RINGERS IV SOLN
INTRAVENOUS | Status: DC
  Administered 2015-05-22: 18:00:00 via INTRAVENOUS

## 2015-05-22 MED ORDER — ACETAMINOPHEN 325 MG PO TABS: 650.0000 mg | ORAL_TABLET | Freq: Four times a day (QID) | ORAL | Status: DC | PRN

## 2015-05-22 MED ORDER — BUPIVACAINE LIPOSOME 1.3 % IJ SUSP
INTRAMUSCULAR | Status: DC | PRN
  Administered 2015-05-22: 20 mL

## 2015-05-22 MED ORDER — SODIUM CHLORIDE 0.9 % IV SOLN
INTRAVENOUS | Status: DC
  Administered 2015-05-22 (×2): via INTRAVENOUS

## 2015-05-22 MED ORDER — METOPROLOL TARTRATE 1 MG/ML IV SOLN
INTRAVENOUS | Status: DC | PRN
  Administered 2015-05-22 (×2): 1 mg via INTRAVENOUS

## 2015-05-22 MED ORDER — METFORMIN HCL 500 MG PO TABS
1000.0000 mg | ORAL_TABLET | Freq: Two times a day (BID) | ORAL | Status: DC
  Administered 2015-05-22 – 2015-05-24 (×4): 1000 mg via ORAL
  Filled 2015-05-22 (×4): qty 2

## 2015-05-22 MED ORDER — MIDAZOLAM HCL 2 MG/2ML IJ SOLN
INTRAMUSCULAR | Status: DC | PRN
  Administered 2015-05-22 (×2): 1 mg via INTRAVENOUS

## 2015-05-22 MED ORDER — MENTHOL 3 MG MT LOZG: 1.0000 | LOZENGE | OROMUCOSAL | Status: DC | PRN

## 2015-05-22 MED ORDER — HYDROMORPHONE HCL 1 MG/ML IJ SOLN: 0.2500 mg | INTRAMUSCULAR | Status: DC | PRN

## 2015-05-22 MED ORDER — BUPIVACAINE-EPINEPHRINE (PF) 0.25% -1:200000 IJ SOLN
INTRAMUSCULAR | Status: DC | PRN
  Administered 2015-05-22: 30 mL

## 2015-05-22 MED ORDER — BISACODYL 5 MG PO TBEC: 5.0000 mg | DELAYED_RELEASE_TABLET | Freq: Every day | ORAL | Status: DC | PRN

## 2015-05-22 MED ORDER — DIVALPROEX SODIUM ER 500 MG PO TB24
1500.0000 mg | ORAL_TABLET | Freq: Every day | ORAL | Status: DC
  Administered 2015-05-22 – 2015-05-23 (×2): 1500 mg via ORAL
  Filled 2015-05-22 (×3): qty 3

## 2015-05-22 MED ORDER — OXYCODONE HCL 5 MG/5ML PO SOLN: 5.0000 mg | Freq: Once | ORAL | Status: DC | PRN

## 2015-05-22 MED ORDER — HYDROMORPHONE HCL 1 MG/ML IJ SOLN
0.5000 mg | INTRAMUSCULAR | Status: DC | PRN
  Administered 2015-05-22 – 2015-05-23 (×4): 1 mg via INTRAVENOUS
  Filled 2015-05-22 (×4): qty 1

## 2015-05-22 MED ORDER — ONDANSETRON HCL 4 MG/2ML IJ SOLN: 4.0000 mg | Freq: Four times a day (QID) | INTRAMUSCULAR | Status: DC | PRN

## 2015-05-22 MED ORDER — ACETAMINOPHEN 325 MG PO TABS: 325.0000 mg | ORAL_TABLET | ORAL | Status: DC | PRN

## 2015-05-22 MED ORDER — QUETIAPINE FUMARATE 400 MG PO TABS
400.0000 mg | ORAL_TABLET | Freq: Two times a day (BID) | ORAL | Status: DC
  Administered 2015-05-22 – 2015-05-24 (×3): 400 mg via ORAL
  Filled 2015-05-22 (×4): qty 1

## 2015-05-22 MED ORDER — PROPOFOL 10 MG/ML IV BOLUS
INTRAVENOUS | Status: DC | PRN
  Administered 2015-05-22: 30 mg via INTRAVENOUS
  Administered 2015-05-22: 40 mg via INTRAVENOUS
  Administered 2015-05-22 (×2): 30 mg via INTRAVENOUS
  Administered 2015-05-22: 40 mg via INTRAVENOUS
  Administered 2015-05-22 (×2): 30 mg via INTRAVENOUS

## 2015-05-22 MED ORDER — PROPOFOL 500 MG/50ML IV EMUL
INTRAVENOUS | Status: DC | PRN
  Administered 2015-05-22: 50 ug/kg/min via INTRAVENOUS

## 2015-05-22 MED ORDER — OXYCODONE HCL 5 MG PO TABS: 5.0000 mg | ORAL_TABLET | Freq: Once | ORAL | Status: DC | PRN

## 2015-05-22 MED ORDER — FENTANYL CITRATE (PF) 250 MCG/5ML IJ SOLN
INTRAMUSCULAR | Status: AC
  Filled 2015-05-22: qty 5

## 2015-05-22 MED ORDER — GLYCOPYRROLATE 0.2 MG/ML IJ SOLN
INTRAMUSCULAR | Status: DC | PRN
  Administered 2015-05-22: 0.1 mg via INTRAVENOUS

## 2015-05-22 MED ORDER — GLYCOPYRROLATE 0.2 MG/ML IJ SOLN
INTRAMUSCULAR | Status: AC
  Filled 2015-05-22: qty 1

## 2015-05-22 MED ORDER — ACETAMINOPHEN 160 MG/5ML PO SOLN
325.0000 mg | ORAL | Status: DC | PRN
  Filled 2015-05-22: qty 20.3

## 2015-05-22 MED ORDER — ONDANSETRON HCL 4 MG PO TABS: 4.0000 mg | ORAL_TABLET | Freq: Four times a day (QID) | ORAL | Status: DC | PRN

## 2015-05-22 MED ORDER — 0.9 % SODIUM CHLORIDE (POUR BTL) OPTIME
TOPICAL | Status: DC | PRN
  Administered 2015-05-22: 1000 mL

## 2015-05-22 MED ORDER — ALUM & MAG HYDROXIDE-SIMETH 200-200-20 MG/5ML PO SUSP: 30.0000 mL | ORAL | Status: DC | PRN

## 2015-05-22 MED ORDER — METOCLOPRAMIDE HCL 5 MG/ML IJ SOLN: 5.0000 mg | Freq: Three times a day (TID) | INTRAMUSCULAR | Status: DC | PRN

## 2015-05-22 MED ORDER — METOCLOPRAMIDE HCL 5 MG PO TABS: 5.0000 mg | ORAL_TABLET | Freq: Three times a day (TID) | ORAL | Status: DC | PRN

## 2015-05-22 MED ORDER — LIDOCAINE HCL (CARDIAC) 20 MG/ML IV SOLN
INTRAVENOUS | Status: DC | PRN
  Administered 2015-05-22: 60 mg via INTRATRACHEAL

## 2015-05-22 MED ORDER — BUPROPION HCL ER (XL) 300 MG PO TB24
300.0000 mg | ORAL_TABLET | Freq: Every day | ORAL | Status: DC
  Administered 2015-05-23 – 2015-05-24 (×2): 300 mg via ORAL
  Filled 2015-05-22 (×2): qty 1

## 2015-05-22 MED ORDER — ONDANSETRON HCL 4 MG/2ML IJ SOLN
INTRAMUSCULAR | Status: AC
  Filled 2015-05-22: qty 2

## 2015-05-22 MED ORDER — LOSARTAN POTASSIUM 50 MG PO TABS
100.0000 mg | ORAL_TABLET | Freq: Every day | ORAL | Status: DC
  Administered 2015-05-22 – 2015-05-24 (×3): 100 mg via ORAL
  Filled 2015-05-22 (×3): qty 2

## 2015-05-22 MED ORDER — ASPIRIN EC 325 MG PO TBEC
325.0000 mg | DELAYED_RELEASE_TABLET | Freq: Two times a day (BID) | ORAL | Status: DC
  Administered 2015-05-23 – 2015-05-24 (×3): 325 mg via ORAL
  Filled 2015-05-22 (×3): qty 1

## 2015-05-22 MED ORDER — DIPHENHYDRAMINE HCL 12.5 MG/5ML PO ELIX
12.5000 mg | ORAL_SOLUTION | ORAL | Status: DC | PRN
  Administered 2015-05-23 (×2): 25 mg via ORAL
  Filled 2015-05-22 (×2): qty 10

## 2015-05-22 MED ORDER — BUPIVACAINE-EPINEPHRINE (PF) 0.5% -1:200000 IJ SOLN
INTRAMUSCULAR | Status: AC
  Filled 2015-05-22: qty 30

## 2015-05-22 MED ORDER — METOPROLOL TARTRATE 1 MG/ML IV SOLN
INTRAVENOUS | Status: AC
  Filled 2015-05-22: qty 5

## 2015-05-22 MED ORDER — MIRTAZAPINE 45 MG PO TABS
45.0000 mg | ORAL_TABLET | Freq: Every day | ORAL | Status: DC
  Administered 2015-05-22 – 2015-05-23 (×2): 45 mg via ORAL
  Filled 2015-05-22: qty 3
  Filled 2015-05-22: qty 1
  Filled 2015-05-22: qty 3
  Filled 2015-05-22 (×2): qty 1

## 2015-05-22 MED ORDER — BUPIVACAINE-EPINEPHRINE (PF) 0.25% -1:200000 IJ SOLN
INTRAMUSCULAR | Status: AC
  Filled 2015-05-22: qty 30

## 2015-05-22 MED ORDER — HYDROCODONE-ACETAMINOPHEN 5-325 MG PO TABS
1.0000 | ORAL_TABLET | ORAL | Status: DC | PRN
  Administered 2015-05-22 – 2015-05-23 (×3): 2 via ORAL
  Filled 2015-05-22 (×3): qty 2

## 2015-05-22 MED ORDER — FENTANYL CITRATE (PF) 250 MCG/5ML IJ SOLN
INTRAMUSCULAR | Status: DC | PRN
  Administered 2015-05-22 (×5): 50 ug via INTRAVENOUS

## 2015-05-22 MED ORDER — TRANEXAMIC ACID 1000 MG/10ML IV SOLN
1000.0000 mg | INTRAVENOUS | Status: AC
  Administered 2015-05-22: 1000 mg via INTRAVENOUS
  Filled 2015-05-22 (×2): qty 10

## 2015-05-22 MED ORDER — DOCUSATE SODIUM 100 MG PO CAPS
100.0000 mg | ORAL_CAPSULE | Freq: Two times a day (BID) | ORAL | Status: DC
  Administered 2015-05-22 – 2015-05-24 (×4): 100 mg via ORAL
  Filled 2015-05-22 (×4): qty 1

## 2015-05-22 MED ORDER — CEFAZOLIN SODIUM-DEXTROSE 2-3 GM-% IV SOLR
2.0000 g | Freq: Three times a day (TID) | INTRAVENOUS | Status: AC
  Administered 2015-05-22 – 2015-05-23 (×2): 2 g via INTRAVENOUS
  Filled 2015-05-22 (×2): qty 50

## 2015-05-22 MED ORDER — PHENOL 1.4 % MT LIQD
1.0000 | OROMUCOSAL | Status: DC | PRN
Start: 2015-05-22 — End: 2015-05-24

## 2015-05-22 MED ORDER — CLONAZEPAM 2 MG PO TABS: 2.0000 mg | ORAL_TABLET | Freq: Two times a day (BID) | ORAL | Status: DC | PRN

## 2015-05-22 MED ORDER — BUPIVACAINE HCL (PF) 0.5 % IJ SOLN
INTRAMUSCULAR | Status: DC | PRN
  Administered 2015-05-22: 3 mL via INTRATHECAL

## 2015-05-22 MED ORDER — CLONAZEPAM 1 MG PO TABS
2.0000 mg | ORAL_TABLET | Freq: Two times a day (BID) | ORAL | Status: DC | PRN
  Administered 2015-05-22 – 2015-05-24 (×2): 2 mg via ORAL
  Filled 2015-05-22 (×2): qty 2

## 2015-05-22 MED ORDER — METHOCARBAMOL 500 MG PO TABS
500.0000 mg | ORAL_TABLET | Freq: Four times a day (QID) | ORAL | Status: DC | PRN
  Administered 2015-05-22 – 2015-05-24 (×7): 500 mg via ORAL
  Filled 2015-05-22 (×7): qty 1

## 2015-05-22 MED ORDER — METHOCARBAMOL 1000 MG/10ML IJ SOLN
500.0000 mg | Freq: Four times a day (QID) | INTRAVENOUS | Status: DC | PRN
  Filled 2015-05-22: qty 5

## 2015-05-22 MED ORDER — HYDROCHLOROTHIAZIDE 12.5 MG PO CAPS
12.5000 mg | ORAL_CAPSULE | Freq: Every day | ORAL | Status: DC
  Administered 2015-05-22 – 2015-05-24 (×3): 12.5 mg via ORAL
  Filled 2015-05-22 (×3): qty 1

## 2015-05-22 MED ORDER — LIDOCAINE HCL (CARDIAC) 20 MG/ML IV SOLN
INTRAVENOUS | Status: AC
  Filled 2015-05-22: qty 5

## 2015-05-22 SURGICAL SUPPLY — 55 items
APL SKNCLS STERI-STRIP NONHPOA (GAUZE/BANDAGES/DRESSINGS) ×1
BENZOIN TINCTURE PRP APPL 2/3 (GAUZE/BANDAGES/DRESSINGS) ×2 IMPLANT
BLADE SAW SGTL 18X1.27X75 (BLADE) ×2 IMPLANT
BLADE SAW SGTL 18X1.27X75MM (BLADE) ×1
BLADE SURG ROTATE 9660 (MISCELLANEOUS) IMPLANT
CAPT HIP TOTAL 2 ×3 IMPLANT
CELLS DAT CNTRL 66122 CELL SVR (MISCELLANEOUS) ×1 IMPLANT
CLOSURE STERI-STRIP 1/2X4 (GAUZE/BANDAGES/DRESSINGS) ×1
CLSR STERI-STRIP ANTIMIC 1/2X4 (GAUZE/BANDAGES/DRESSINGS) ×1 IMPLANT
COVER PERINEAL POST (MISCELLANEOUS) ×3 IMPLANT
COVER SURGICAL LIGHT HANDLE (MISCELLANEOUS) ×3 IMPLANT
DRAPE C-ARM 42X72 X-RAY (DRAPES) ×3 IMPLANT
DRAPE IMP U-DRAPE 54X76 (DRAPES) ×3 IMPLANT
DRAPE STERI IOBAN 125X83 (DRAPES) ×3 IMPLANT
DRAPE U-SHAPE 47X51 STRL (DRAPES) ×9 IMPLANT
DRSG AQUACEL AG ADV 3.5X10 (GAUZE/BANDAGES/DRESSINGS) ×3 IMPLANT
DURAPREP 26ML APPLICATOR (WOUND CARE) ×3 IMPLANT
ELECT BLADE 4.0 EZ CLEAN MEGAD (MISCELLANEOUS)
ELECT CAUTERY BLADE 6.4 (BLADE) ×3 IMPLANT
ELECT REM PT RETURN 9FT ADLT (ELECTROSURGICAL) ×3
ELECTRODE BLDE 4.0 EZ CLN MEGD (MISCELLANEOUS) IMPLANT
ELECTRODE REM PT RTRN 9FT ADLT (ELECTROSURGICAL) ×1 IMPLANT
FACESHIELD WRAPAROUND (MASK) ×6 IMPLANT
GLOVE BIO SURGEON STRL SZ8 (GLOVE) ×6 IMPLANT
GLOVE BIOGEL PI IND STRL 8 (GLOVE) ×2 IMPLANT
GLOVE BIOGEL PI INDICATOR 8 (GLOVE) ×4
GOWN STRL REUS W/ TWL LRG LVL3 (GOWN DISPOSABLE) ×1 IMPLANT
GOWN STRL REUS W/ TWL XL LVL3 (GOWN DISPOSABLE) ×2 IMPLANT
GOWN STRL REUS W/TWL LRG LVL3 (GOWN DISPOSABLE) ×3
GOWN STRL REUS W/TWL XL LVL3 (GOWN DISPOSABLE) ×6
KIT BASIN OR (CUSTOM PROCEDURE TRAY) ×3 IMPLANT
KIT ROOM TURNOVER OR (KITS) ×3 IMPLANT
LINER BOOT UNIVERSAL DISP (MISCELLANEOUS) ×3 IMPLANT
MANIFOLD NEPTUNE II (INSTRUMENTS) ×3 IMPLANT
NEEDLE 22X1 1/2 (OR ONLY) (NEEDLE) ×2 IMPLANT
NS IRRIG 1000ML POUR BTL (IV SOLUTION) ×3 IMPLANT
PACK TOTAL JOINT (CUSTOM PROCEDURE TRAY) ×3 IMPLANT
PACK UNIVERSAL I (CUSTOM PROCEDURE TRAY) ×3 IMPLANT
PAD ARMBOARD 7.5X6 YLW CONV (MISCELLANEOUS) ×6 IMPLANT
RETRACTOR WND ALEXIS 18 MED (MISCELLANEOUS) ×1 IMPLANT
RTRCTR WOUND ALEXIS 18CM MED (MISCELLANEOUS) ×3
STAPLER VISISTAT 35W (STAPLE) ×3 IMPLANT
SUT ETHIBOND NAB CT1 #1 30IN (SUTURE) ×9 IMPLANT
SUT VIC AB 0 CT1 27 (SUTURE)
SUT VIC AB 0 CT1 27XBRD ANBCTR (SUTURE) IMPLANT
SUT VIC AB 1 CT1 27 (SUTURE) ×3
SUT VIC AB 1 CT1 27XBRD ANBCTR (SUTURE) ×1 IMPLANT
SUT VIC AB 2-0 CT1 27 (SUTURE) ×3
SUT VIC AB 2-0 CT1 TAPERPNT 27 (SUTURE) ×1 IMPLANT
SUT VLOC 180 0 24IN GS25 (SUTURE) ×3 IMPLANT
SYR 50ML LL SCALE MARK (SYRINGE) ×3 IMPLANT
TOWEL OR 17X24 6PK STRL BLUE (TOWEL DISPOSABLE) ×3 IMPLANT
TOWEL OR 17X26 10 PK STRL BLUE (TOWEL DISPOSABLE) ×6 IMPLANT
TRAY FOLEY CATH 14FR (SET/KITS/TRAYS/PACK) IMPLANT
WATER STERILE IRR 1000ML POUR (IV SOLUTION) ×6 IMPLANT

## 2015-05-22 NOTE — Transfer of Care (Signed)
Immediate Anesthesia Transfer of Care Note  Patient: [name]  Procedure(s) Performed: Procedure(s): TOTAL HIP ARTHROPLASTY ANTERIOR APPROACH (Left)  Patient Location: PACU  Anesthesia Type:Spinal  Level of Consciousness: awake, alert  and oriented  Airway & Oxygen Therapy: Patient Spontanous Breathing and Patient connected to face mask oxygen  Post-op Assessment: Report given to RN, Post -op Vital signs reviewed and stable and Patient able to stick tongue midline  Post vital signs: Reviewed and stable  Last Vitals:  Filed Vitals:   05/22/15 0738 05/22/15 1209  BP: 170/94 103/71  Pulse: 87   Temp: 36.3 C 36.4 C  Resp: 20     Complications: No apparent anesthesia complications

## 2015-05-22 NOTE — Progress Notes (Signed)
Pt was DTV by 6 pm. He stated that after every surgery he has difficulties voiding and has to be cathed. Bladder scan around 6 pm showed 836 cc, I&O cath was attempted by NT and RN. When attempting cath, resistance was immediately met after inserting catheter so it could not be advanced at all. Gaspar Skeeters, PA on call was notified, ordered a coude I&O cath to be done and to let him know if there were any issues. Coude cath was ordered from materials and RN from [employer] will place when it is delivered to unit. Will pass information on to oncoming RN.  [facility] [place], Jerry Caras

## 2015-05-22 NOTE — Anesthesia Preprocedure Evaluation (Signed)
Anesthesia Evaluation  Patient identified by MRN, date of birth, ID band Patient awake    Reviewed: Allergy & Precautions, NPO status , Patient's Chart, lab work & pertinent test results  History of Anesthesia Complications Negative for: history of anesthetic complications  Airway Mallampati: III  TM Distance: >3 FB Neck ROM: Full    Dental  (+) Teeth Intact   Pulmonary neg shortness of breath, neg sleep apnea, neg COPD, neg recent URI, former smoker,    breath sounds clear to auscultation- rhonchi       Cardiovascular hypertension, Pt. on medications (-) angina(-) Past MI and (-) CHF  Rhythm:Regular     Neuro/Psych PSYCHIATRIC DISORDERS Anxiety Depression Bipolar Disorder negative neurological ROS     GI/Hepatic negative GI ROS, Neg liver ROS,   Endo/Other  diabetes, Type 2  Renal/GU CRFRenal disease     Musculoskeletal   Abdominal   Peds  Hematology   Anesthesia Other Findings   Reproductive/Obstetrics                             Anesthesia Physical Anesthesia Plan  ASA: III  Anesthesia Plan: Spinal   Post-op Pain Management:    Induction: Intravenous  Airway Management Planned: Nasal Cannula, Natural Airway and Simple Face Mask  Additional Equipment:   Intra-op Plan:   Post-operative Plan:   Informed Consent: I have reviewed the patients History and Physical, chart, labs and discussed the procedure including the risks, benefits and alternatives for the proposed anesthesia with the patient or authorized representative who has indicated his/her understanding and acceptance.   Dental advisory given  Plan Discussed with: CRNA and Surgeon  Anesthesia Plan Comments:         Anesthesia Quick Evaluation

## 2015-05-22 NOTE — Op Note (Signed)
PRE-OP DIAGNOSIS:  LEFT HIP DEGENERATIVE JOINT DISEASE POST-OP DIAGNOSIS: same PROCEDURE:  LEFT TOTAL HIP ARTHROPLASTY ANTERIOR APPROACH ANESTHESIA:  Spinal and MAC SURGEON:  Melrose Nakayama MD ASSISTANT:  Loni Dolly PA-C   INDICATIONS FOR PROCEDURE:  The patient is a 50 y.o. male with a long history of a painful hip.  This has persisted despite multiple conservative measures.  The patient has persisted with pain and dysfunction making rest and activity difficult.  A total hip replacement is offered as surgical treatment.  Informed operative consent was obtained after discussion of possible complications including reaction to anesthesia, infection, neurovascular injury, dislocation, DVT, PE, and death.  The importance of the postoperative rehab program to optimize result was stressed with the patient.  SUMMARY OF FINDINGS AND PROCEDURE:  Under general anesthesia through a anterior approach an the Hana table a left THR was performed.  The patient had severe degenerative change and excellent bone quality.  We used DePuy components to replace the hip and these were size KA 13 Corail femur capped with a +1.5 48mm ceramic hip ball.  On the acetabular side we used a size 54 Gription shell with a plus 4 neutral polyethylene liner.  We did use a hole eliminator.  Loni Dolly PA-C assisted throughout and was invaluable to the completion of the case in that he helped position and retract while I performed the procedure.  He also closed simultaneously to help minimize OR time.  I used fluoroscopy throughout the case to check position of components and leg lengths and read all these views myself.  DESCRIPTION OF PROCEDURE:  The patient was taken to the OR suite where general anesthetic was applied.  The patient was then positioned on the Hana table supine.  All bony prominences were appropriately padded.  Prep and drape was then performed in normal sterile fashion.  The patient was given kefzol preoperative  antibiotic and an appropriate time out was performed.  We then took an anterior approach to the left hip.  Dissection was taken through adipose to the tensor fascia lata fascia.  This structure was incised longitudinally and we dissected in the intermuscular interval just medial to this muscle.  Cobra retractors were placed superior and inferior to the femoral neck superficial to the capsule.  A capsular incision was then made and the retractors were placed along the femoral neck.  Xray was brought in to get a good level for the femoral neck cut which was made with an oscillating saw and osteotome.  The femoral head was removed with a corkscrew.  The acetabulum was exposed and some labral tissues were excised. Reaming was taken to the inside wall of the pelvis and sequentially up to 1 mm smaller than the actual component.  A trial of components was done and then the aforementioned acetabular shell was placed in appropriate tilt and anteversion confirmed by fluoroscopy. The liner was placed along with the hole eliminator and attention was turned to the femur.  The leg was brought down and over into adduction and the elevator bar was used to raise the femur up gently in the wound.  The piriformis was released with care taken to preserve the obturator internus attachment and all of the posterior capsule. The femur was reamed and then broached to the appropriate size.  A trial reduction was done and the aforementioned head and neck assembly gave [place] the best stability in extension with external rotation.  Leg lengths were felt to be about equal by fluoroscopic exam.  The trial components were removed and the wound irrigated.  We then placed the femoral component in appropriate anteversion.  The head was applied to a dry stem neck and the hip again reduced.  It was again stable in the aforementioned position.  The would was irrigated again followed by re-approximation of anterior capsule with ethibond suture. Tensor  fascia was repaired with V-loc suture  followed by subcutaneous closure with #O and #2 undyed vicryl.  Skin was closed with subQ stitch and steristrips followed by a sterile dressing.  EBL and IOF can be obtained from anesthesia records.  DISPOSITION:  The patient was extubated in the OR and taken to PACU in stable condition to be admitted to the Orthopedic Surgery for appropriate post-op care to include perioperative antibiotics and DVT prophylaxis.

## 2015-05-22 NOTE — Progress Notes (Signed)
Pt. Reported yesterday he was reaching for something and twisted his leg and fell. The pain is the same as prior to the fall.

## 2015-05-22 NOTE — Interval H&P Note (Signed)
OK for surgery PD 

## 2015-05-22 NOTE — Progress Notes (Signed)
The patient was unable to void since his hip surgery earlier today. Nursing made 2 attempts to catheterize him with an in and out cath using a coud catheter. They were running into resistance near the meatus. I came to the floor and under sterile conditions used viscous lidocaine gel and used a 12 French red rubber catheter in and out cath and was able to get past the urethral stricture and drained 1600 mL of normal-appearing urine from his bladder. Hopefully he'll build avoid from here on out without difficulty. The patient did have spinal anesthesia which may be part of the issue as well as a small urethra/stricture. I did contact Dr. Louis Meckel urologist who was on call for advice prior to doing this in and out cath.

## 2015-05-22 NOTE — Anesthesia Procedure Notes (Signed)
Spinal Patient location during procedure: OR Staffing Anesthesiologist: Rosali Augello Preanesthetic Checklist Completed: patient identified, surgical consent, pre-op evaluation, timeout performed, IV checked, risks and benefits discussed and monitors and equipment checked Spinal Block Patient position: sitting Prep: site prepped and draped and DuraPrep Patient monitoring: heart rate, cardiac monitor, continuous pulse ox and blood pressure Approach: midline Location: L3-4 Injection technique: single-shot Needle Needle type: Pencan  Needle gauge: 24 G Needle length: 10 cm Assessment Sensory level: T8

## 2015-05-22 NOTE — Anesthesia Postprocedure Evaluation (Signed)
Anesthesia Post Note  Patient: [name]  Procedure(s) Performed: Procedure(s) (LRB): TOTAL HIP ARTHROPLASTY ANTERIOR APPROACH (Left)  Patient location during evaluation: PACU Anesthesia Type: Spinal and MAC Level of consciousness: awake and alert Pain management: pain level controlled Vital Signs Assessment: post-procedure vital signs reviewed and stable Respiratory status: spontaneous breathing and respiratory function stable Cardiovascular status: blood pressure returned to baseline and stable Postop Assessment: spinal receding Anesthetic complications: no    Last Vitals:  Filed Vitals:   05/22/15 1215 05/22/15 1230  BP: 101/74 101/70  Pulse: 77 71  Temp:    Resp: 10 13    Last Pain:  Filed Vitals:   05/22/15 1241  PainSc: 4                  Neko Boyajian DANIEL

## 2015-05-23 ENCOUNTER — Encounter: Payer: Self-pay | Admitting: Internal Medicine

## 2015-05-23 ENCOUNTER — Other Ambulatory Visit: Payer: Self-pay | Admitting: Internal Medicine

## 2015-05-23 ENCOUNTER — Encounter (HOSPITAL_COMMUNITY): Payer: Self-pay | Admitting: Orthopaedic Surgery

## 2015-05-23 LAB — GLUCOSE, CAPILLARY: GLUCOSE-CAPILLARY: 156 mg/dL — AB (ref 65–99)

## 2015-05-23 MED ORDER — LIDOCAINE HCL 2 % EX GEL
1.0000 "application " | Freq: Once | CUTANEOUS | Status: DC
  Filled 2015-05-23: qty 5

## 2015-05-23 MED ORDER — OXYCODONE-ACETAMINOPHEN 5-325 MG PO TABS
1.0000 | ORAL_TABLET | ORAL | Status: DC | PRN
  Administered 2015-05-23 – 2015-05-24 (×8): 2 via ORAL
  Filled 2015-05-23 (×8): qty 2

## 2015-05-23 NOTE — Progress Notes (Signed)
Subjective: 1 Day Post-Op Procedure(s) (LRB): TOTAL HIP ARTHROPLASTY ANTERIOR APPROACH (Left)  Patient resting comfortably in bed. He is still having difficulty urinating. He had an in and out with a 12 french cath last night. I spoke with urology this mornign and they stated to contact [name] to get a nurce to come with the urology cart to try a cath and to leave it in. If they are unsuccessful then they will contact urology again.  Activity level:  wbat Diet tolerance:  ok Voiding:  Urinary retention Patient reports pain as mild and moderate.    Objective: Vital signs in last 24 hours: Temp:  [97.6 F (36.4 C)-98.9 F (37.2 C)] 98.9 F (37.2 C) (02/01 0437) Pulse Rate:  [66-98] 94 (02/01 0437) Resp:  [9-23] 16 (02/01 0437) BP: (94-121)/(55-92) 110/55 mmHg (02/01 0437) SpO2:  [93 %-100 %] 94 % (02/01 0437)  Labs: No results for input(s): HGB in the last 72 hours. No results for input(s): WBC, RBC, HCT, PLT in the last 72 hours. No results for input(s): NA, K, CL, CO2, BUN, CREATININE, GLUCOSE, CALCIUM in the last 72 hours. No results for input(s): LABPT, INR in the last 72 hours.  Physical Exam:  Neurologically intact ABD soft Neurovascular intact Sensation intact distally Intact pulses distally Dorsiflexion/Plantar flexion intact Incision: dressing C/D/I and no drainage No cellulitis present Compartment soft  Assessment/Plan:  1 Day Post-Op Procedure(s) (LRB): TOTAL HIP ARTHROPLASTY ANTERIOR APPROACH (Left) Advance diet Up with therapy Plan for discharge tomorrow Discharge home with home health if doing well and able to urinate. They will attempt to place a urinary cath. If unsuccessful then they will call Dr. Diona Fanti with urology. He will continue on ASA 325mg  BID x 4 weeks. Follow up in office 2 weeks post op.  [name], [name] 05/23/2015, 8:00 AM

## 2015-05-23 NOTE — Progress Notes (Signed)
Notified provider on call of bladder scan of > 999.  Patient attempted to void with assistance, unsuccessful.  Provider on call advised to wait for oncoming day shift provider.  Will continue to monitor and evaluate.

## 2015-05-23 NOTE — Progress Notes (Signed)
Utilization review completed. Almer Littleton, RN, BSN. 

## 2015-05-23 NOTE — Evaluation (Signed)
Occupational Therapy Evaluation Patient Details Name: [name] MRN: [id number] DOB: Sep 16, 1965 Today's Date: 05/23/2015    History of Present Illness 50 y.o. male now s/p Lt direct anterior THA. PMH: Rt THA, anxiety, depression, ADD, bipolar, PTSD, diabetes.    Clinical Impression   Patient presenting with decreased ADL and functional mobility independence secondary to above. Patient independent PTA. Patient currently functioning at an overall min to max assist level. Patient will benefit from acute OT to increase overall independence in the areas of ADLs, functional mobility, and overall safety in order to safely discharge home with assistance from sister intermittently.   Limited eval secondary to nausea.     Follow Up Recommendations  No OT follow up;Supervision/Assistance - 24 hour    Equipment Recommendations  Other (comment) (AE - reacher, sock aid, LH sponge, LH shoe horn)    Recommendations for Other Services  None at this time   Precautions / Restrictions Precautions Precautions: Fall Precaution Comments: anterior approach Restrictions Weight Bearing Restrictions: Yes LLE Weight Bearing: Weight bearing as tolerated    Mobility Bed Mobility - Per PT eval Overal bed mobility: Needs Assistance Bed Mobility: Supine to Sit     Supine to sit: Supervision     General bed mobility comments: using rail to assist  Transfers Overall transfer level: Needs assistance Equipment used: Rolling walker (2 wheeled) Transfers: Sit to/from Stand Sit to Stand: Min guard   General transfer comment: reminder for hand placement, cues for LLE management and placement    Balance Overall balance assessment: Needs assistance Sitting-balance support: No upper extremity supported;Feet supported Sitting balance-Leahy Scale: Fair     Standing balance support: Bilateral upper extremity supported;During functional activity Standing balance-Leahy Scale: Fair Standing balance  comment: using rw    ADL Overall ADL's : Needs assistance/impaired Eating/Feeding: Set up;Sitting   Grooming: Set up;Sitting   Upper Body Bathing: Set up;Sitting   Lower Body Bathing: Moderate assistance;Sit to/from stand   Upper Body Dressing : Set up;Sitting   Lower Body Dressing: Maximal assistance;Sit to/from stand   Toilet Transfer: Minimal assistance;RW;Grab bars;Comfort height toilet;Ambulation Functional mobility during ADLs: Cueing for sequencing;Rolling walker;Min guard General ADL Comments: Pt limited this session by nausea. Would like to go over use of AE for LB ADLs and tub/shower transfer using 3-n-1 next OT session    Pertinent Vitals/Pain Pain Assessment: Faces Pain Score: 8  Faces Pain Scale: Hurts even more Pain Location: left hip Pain Descriptors / Indicators: Aching Pain Intervention(s): Limited activity within patient's tolerance;Monitored during session     Hand Dominance Right   Extremity/Trunk Assessment Upper Extremity Assessment Upper Extremity Assessment: Overall WFL for tasks assessed   Lower Extremity Assessment Lower Extremity Assessment: Defer to PT evaluation LLE Deficits / Details: able to move without assistance with bed mobility. Difficulty lifting due to pain.    Cervical / Trunk Assessment Cervical / Trunk Assessment: Normal   Communication Communication Communication: No difficulties   Cognition Arousal/Alertness: Awake/alert Behavior During Therapy: WFL for tasks assessed/performed Overall Cognitive Status: Within Functional Limits for tasks assessed              Home Living Family/patient expects to be discharged to:: Private residence Living Arrangements: Other relatives Available Help at Discharge: Family Type of Home: House Home Access: Stairs to enter Technical brewer of Steps: 1-2 Entrance Stairs-Rails: None Home Layout: Two level Alternate Level Stairs-Number of Steps: 1 flight (split) Alternate Level  Stairs-Rails: Left Bathroom Shower/Tub: Tub/shower unit;Curtain   Bathroom Toilet: Standard Additional  Comments: Will stay with his sister and her family upon D/C.       Prior Functioning/Environment Level of Independence: Independent     OT Diagnosis: Generalized weakness;Acute pain   OT Problem List: Decreased strength;Decreased activity tolerance;Impaired balance (sitting and/or standing);Decreased safety awareness;Decreased knowledge of use of DME or AE;Pain   OT Treatment/Interventions: Self-care/ADL training;Therapeutic exercise;Energy conservation;DME and/or AE instruction;Therapeutic activities;Patient/family education;Balance training    OT Goals(Current goals can be found in the care plan section) Acute Rehab OT Goals Patient Stated Goal: none stated OT Goal Formulation: With patient Time For Goal Achievement: 06/06/15 Potential to Achieve Goals: Good ADL Goals Pt Will Perform Lower Body Bathing: with modified independence;sit to/from stand;with adaptive equipment Pt Will Perform Lower Body Dressing: with modified independence;sit to/from stand;with adaptive equipment Pt Will Transfer to Toilet: with modified independence;ambulating;bedside commode Pt Will Perform Tub/Shower Transfer: Tub transfer;ambulating;3 in 1;rolling walker;with modified independence  OT Frequency: Min 2X/week   Barriers to D/C: Decreased caregiver support   End of Session Equipment Utilized During Treatment: Rolling walker Nurse Communication: Other (comment) (recommendation for a 3-n-1 in patient's room)  Activity Tolerance: Other (comment) (limited by nausea) Patient left: in chair;with call bell/phone within reach   Time: 1232-1247 OT Time Calculation (min): 15 min Charges:  OT General Charges $OT Visit: 1 Procedure OT Evaluation $OT Eval Low Complexity: 1 Procedure  Chrys Racer , MS, OTR/L, CLT Pager: [phone]  05/23/2015, 1:32 PM

## 2015-05-23 NOTE — Progress Notes (Signed)
Physical Therapy Treatment Patient Details Name: [name] MRN: [id number] DOB: 1966-03-25 Today's Date: 05/23/2015    History of Present Illness 50 y.o. male now s/p Lt direct anterior THA. PMH: Rt THA, anxiety, depression, ADD, bipolar, PTSD, diabetes.     PT Comments    [name] made good progress, ambulating 200 ft and transitioning to step through gait pattern.  Began stair training today (see notes below) and pt will need to attempt stairs again tomorrow.    Follow Up Recommendations  Home health PT;Supervision for mobility/OOB     Equipment Recommendations  Rolling walker with 5" wheels    Recommendations for Other Services       Precautions / Restrictions Precautions Precautions: Fall Precaution Comments: anterior approach Restrictions Weight Bearing Restrictions: Yes LLE Weight Bearing: Weight bearing as tolerated    Mobility  Bed Mobility Overal bed mobility: Needs Assistance Bed Mobility: Supine to Sit;Sit to Supine     Supine to sit: Supervision Sit to supine: Supervision   General bed mobility comments: Increased time and cues for technique.    Transfers Overall transfer level: Needs assistance Equipment used: Rolling walker (2 wheeled) Transfers: Sit to/from Stand Sit to Stand: Supervision         General transfer comment: Cues for technique after pt w/ uncontrolled descent and trunk rotated to Rt when sitting.  Ambulation/Gait Ambulation/Gait assistance: Supervision Ambulation Distance (Feet): 200 Feet Assistive device: Rolling walker (2 wheeled) Gait Pattern/deviations: Step-through pattern;Decreased stride length;Step-to pattern;Decreased weight shift to left;Antalgic Gait velocity: decreased   General Gait Details: starting with step-to pattern and progressing to step through pattern w/ cues to keep RW moving.  Pt WB heavily through Bil UE on RW.    Stairs Stairs: Yes Stairs assistance: Mod assist Stair Management: One rail Left;No  rails;Step to pattern;Forwards;Sideways Number of Stairs: 8 General stair comments: Lt rail sideways x6 steps w/ supervision and cues for technique.  Attempted no rails (as at home to get inside home) w/ HHA and pt required mod assist and was unsteady.  Discussed w/ pt technique placing chair on second step, sitting on the chair and then receiving assist to turn it around into home, pt verbalized understanding.  Wheelchair Mobility    Modified Rankin (Stroke Patients Only)       Balance Overall balance assessment: Needs assistance Sitting-balance support: Bilateral upper extremity supported;Feet supported Sitting balance-Leahy Scale: Fair     Standing balance support: Bilateral upper extremity supported;During functional activity Standing balance-Leahy Scale: Poor Standing balance comment: Relies heavily on RW for support                    Cognition Arousal/Alertness: Awake/alert Behavior During Therapy: WFL for tasks assessed/performed Overall Cognitive Status: Within Functional Limits for tasks assessed                      Exercises General Exercises - Lower Extremity Ankle Circles/Pumps: AROM;Both;10 reps;Supine Gluteal Sets: Strengthening;Both;10 reps;Supine    General Comments General comments (skin integrity, edema, etc.): Patient having difficulty with initial sitting at EOB due to pain with weight shift to LT side. Able to improve with time.       Pertinent Vitals/Pain Pain Assessment: 0-10 Pain Score: 8  Faces Pain Scale: Hurts even more Pain Location: Lt hip Pain Descriptors / Indicators: Aching;Constant;Sore Pain Intervention(s): Limited activity within patient's tolerance;Monitored during session    [place] expects to be discharged to:: Private residence Living Arrangements: Other relatives Available  Help at Discharge: Family Type of Home: House Home Access: Stairs to enter Entrance Stairs-Rails: None Home Layout: Two  level   Additional Comments: Will stay with his sister and her family upon D/C.     Prior Function Level of Independence: Independent          PT Goals (current goals can now be found in the care plan section) Acute Rehab PT Goals Patient Stated Goal: to get stronger PT Goal Formulation: With patient Time For Goal Achievement: 06/06/15 Potential to Achieve Goals: Good Progress towards PT goals: Progressing toward goals    Frequency  7X/week    PT Plan Current plan remains appropriate    Co-evaluation             End of Session Equipment Utilized During Treatment: Gait belt Activity Tolerance: Patient tolerated treatment well;Patient limited by pain Patient left: with call bell/phone within reach;in bed;with bed alarm set     Time: [id number] PT Time Calculation (min) (ACUTE ONLY): 23 min  Charges:  $Gait Training: 23-37 mins                    G Codes:      [name] PT, [name] [id number] Pager: [phone] 05/23/2015, 3:44 PM

## 2015-05-23 NOTE — Progress Notes (Signed)
Before I&O cath was able to be done by 4W nurse, pt stated he had a strong urge to void. He was assisted in standing at the bedside and voided 250 ccs clear, yellow urine. I&O cath was postponed at this time. At 11 am, pt voided another 500 ccs and stated that his bladder felt empty now. Will continue to monitor output.   [place], [name]

## 2015-05-23 NOTE — Progress Notes (Signed)
CSW consult for SNF placement. Per RNCM, plan is for home with [facility] needs. No other CSW needs identified or reported. CSW signing off.   Wandra Feinstein, MSW, LCSW

## 2015-05-23 NOTE — Evaluation (Addendum)
Physical Therapy Evaluation Patient Details Name: [name] MRN: [id number] DOB: 11-29-1965 Today's Date: 05/23/2015   History of Present Illness  50 y.o. male now s/p Lt direct anterior THA. PMH: Rt THA, anxiety, depression, ADD, bipolar, PTSD, diabetes.   Clinical Impression  Pt is s/p Lt direct anterior THA resulting in the deficits listed below (see PT Problem List). Pt will benefit from skilled PT to increase their independence and safety with mobility to allow discharge to the venue listed below. Patient able to ambulate 100 feet with min guard assistance and rw during initial session. Anticipating patient will D/C to his sister's home and family assistance. PT to follow and progress mobility as tolerated.      Follow Up Recommendations Home health PT;Supervision for mobility/OOB    Equipment Recommendations  Rolling walker with 5" wheels    Recommendations for Other Services       Precautions / Restrictions Precautions Precautions: Fall Restrictions Weight Bearing Restrictions: Yes LLE Weight Bearing: Weight bearing as tolerated      Mobility  Bed Mobility Overal bed mobility: Needs Assistance Bed Mobility: Supine to Sit     Supine to sit: Supervision     General bed mobility comments: using rail to assist  Transfers Overall transfer level: Needs assistance Equipment used: Rolling walker (2 wheeled) Transfers: Sit to/from Stand Sit to Stand: Min guard         General transfer comment: reminder for hand placement  Ambulation/Gait Ambulation/Gait assistance: Min guard Ambulation Distance (Feet): 100 Feet   Gait Pattern/deviations: Step-to pattern;Step-through pattern;Decreased step length - left Gait velocity: decreased   General Gait Details: starting with step-to pattern and progressing to limited step through pattern.   Stairs            Wheelchair Mobility    Modified Rankin (Stroke Patients Only)       Balance Overall balance  assessment: Needs assistance Sitting-balance support: No upper extremity supported Sitting balance-Leahy Scale: Fair     Standing balance support: Bilateral upper extremity supported Standing balance-Leahy Scale: Poor Standing balance comment: using rw                             Pertinent Vitals/Pain Pain Assessment: 0-10 Pain Score: 8  Pain Location: Lt hip Pain Descriptors / Indicators: Aching Pain Intervention(s): Limited activity within patient's tolerance;Monitored during session    Home Living Family/patient expects to be discharged to:: Private residence Living Arrangements: Other relatives Available Help at Discharge: Family Type of Home: House Home Access: Stairs to enter Entrance Stairs-Rails: None Entrance Stairs-Number of Steps: 1-2 Home Layout: Two level   Additional Comments: Will stay with his sister and her family upon D/C.     Prior Function Level of Independence: Independent               Hand Dominance        Extremity/Trunk Assessment   Upper Extremity Assessment: Defer to OT evaluation           Lower Extremity Assessment: LLE deficits/detail   LLE Deficits / Details: able to move without assistance with bed mobility. Difficulty lifting due to pain.      Communication   Communication: No difficulties  Cognition Arousal/Alertness: Awake/alert Behavior During Therapy: WFL for tasks assessed/performed Overall Cognitive Status: Within Functional Limits for tasks assessed  General Comments General comments (skin integrity, edema, etc.): Patient having difficulty with initial sitting at EOB due to pain with weight shift to LT side. Able to improve with time.     Exercises        Assessment/Plan    PT Assessment Patient needs continued PT services  PT Diagnosis Difficulty walking   PT Problem List Decreased strength;Decreased range of motion;Decreased activity tolerance;Decreased  balance;Decreased mobility;Decreased knowledge of use of DME;Pain  PT Treatment Interventions DME instruction;Gait training;Functional mobility training;Stair training;Therapeutic activities;Therapeutic exercise;Patient/family education   PT Goals (Current goals can be found in the Care Plan section) Acute Rehab PT Goals Patient Stated Goal: be able to exercise again PT Goal Formulation: With patient Time For Goal Achievement: 06/06/15 Potential to Achieve Goals: Good    Frequency 7X/week   Barriers to discharge        Co-evaluation               End of Session Equipment Utilized During Treatment: Gait belt Activity Tolerance: Patient tolerated treatment well Patient left: in chair;with call bell/phone within reach;with nursing/sitter in room Nurse Communication: Mobility status;Weight bearing status         Time: [id number] PT Time Calculation (min) (ACUTE ONLY): 30 min   Charges:   PT Evaluation $PT Eval Moderate Complexity: 1 Procedure PT Treatments $Gait Training: 8-22 mins   PT G Codes:        Cassell Clement, PT, CSCS Pager [phone] Office [phone]  05/23/2015, 1:13 PM

## 2015-05-24 MED ORDER — OXYCODONE-ACETAMINOPHEN 5-325 MG PO TABS: 1.0000 | ORAL_TABLET | ORAL | Status: DC | PRN

## 2015-05-24 MED ORDER — METHOCARBAMOL 500 MG PO TABS: 500.0000 mg | ORAL_TABLET | Freq: Four times a day (QID) | ORAL | Status: DC | PRN

## 2015-05-24 MED ORDER — ASPIRIN 325 MG PO TBEC: 325.0000 mg | DELAYED_RELEASE_TABLET | Freq: Two times a day (BID) | ORAL | Status: DC

## 2015-05-24 NOTE — Progress Notes (Signed)
Pt ready for discharge. Education/instructions reviewed with pt and all questions/concerns addressed. IV removed and belongings gathered. Pt will be transported out via wheelchair to sister's car. Will continue to monitor.

## 2015-05-24 NOTE — Progress Notes (Signed)
Occupational Therapy Treatment Patient Details Name: BARTLETT MUHLENKAMP MRN: [id number] DOB: 01-16-1966 Today's Date: 05/24/2015    History of present illness 50 y.o. male now s/p Lt direct anterior THA. PMH: Rt THA, anxiety, depression, ADD, bipolar, PTSD, diabetes.    OT comments  Patient progressing towards OT goals, continue plan of care for now. Pt continues to be limited by increased pain and decreased overall activity tolerance/endurance.   Follow Up Recommendations  No OT follow up;Supervision/Assistance - 24 hour    Equipment Recommendations  Other (comment) (AE - reacher, sock aid, LH sponge. LH shoe horn)    Recommendations for Other Services  None at this time   Precautions / Restrictions Precautions Precautions: Fall Precaution Comments: anterior approach Restrictions Weight Bearing Restrictions: Yes LLE Weight Bearing: Weight bearing as tolerated    Mobility Bed Mobility Overal bed mobility: Needs Assistance Bed Mobility: Supine to Sit     Supine to sit: Supervision     General bed mobility comments: Increased time and cues for technique.    Transfers Overall transfer level: Needs assistance Equipment used: Rolling walker (2 wheeled) Transfers: Sit to/from Stand Sit to Stand: Min guard         General transfer comment: Min guard for safety, cues for technique and LLE management    Balance Overall balance assessment: Needs assistance Sitting-balance support: Bilateral upper extremity supported;Feet supported Sitting balance-Leahy Scale: Fair     Standing balance support: Bilateral upper extremity supported;During functional activity Standing balance-Leahy Scale: Poor Standing balance comment: relies heavily on RW for support   ADL Overall ADL's : Needs assistance/impaired Eating/Feeding: Set up;Sitting   Grooming: Set up;Sitting   Upper Body Bathing: Set up;Sitting Upper Body Bathing Details (indicate cue type and reason): decreased sitting  balance secondary to pain in left hip Lower Body Bathing: Supervison/ safety;Set up;With adaptive equipment   Upper Body Dressing : Set up;Sitting Upper Body Dressing Details (indicate cue type and reason): decreased sitting balance secondary to pain in left hip Lower Body Dressing: Set up;Supervision/safety;With adaptive equipment   Toilet Transfer: Minimal assistance;Ambulation;RW;BSC;Grab bars;Cueing for safety       Tub/ Shower Transfer: Minimal assistance;Ambulation;3 in 1;Rolling walker;Tub transfer   Functional mobility during ADLs: Cueing for sequencing;Rolling walker;Min guard General ADL Comments: Went over use of AE to increase independence with LB ADLs and also went over tub/shower transfer using 3-n-1. Pt limited by increased pain and overall decreased activity tolerance/endurance.      Cognition   Behavior During Therapy: WFL for tasks assessed/performed Overall Cognitive Status: Within Functional Limits for tasks assessed                 Pertinent Vitals/ Pain       Pain Assessment: 0-10 Pain Score: 10-Worst pain ever Pain Location: left hip Pain Descriptors / Indicators: Aching;Sore;Constant Pain Intervention(s): Limited activity within patient's tolerance;Monitored during session;Repositioned   Frequency Min 2X/week     Progress Toward Goals  OT Goals(current goals can now befound in the care plan section)  Progress towards OT goals: Progressing toward goals  Acute Rehab OT Goals Patient Stated Goal: to get stronger and feel better OT Goal Formulation: With patient Time For Goal Achievement: 06/06/15 Potential to Achieve Goals: Good  Plan Discharge plan remains appropriate    End of Session Equipment Utilized During Treatment: Rolling walker;Gait belt   Activity Tolerance Patient limited by pain   Patient Left in chair;with call bell/phone within reach    Time: [id number] OT Time Calculation (min): 27  min  Charges: OT General Charges $OT  Visit: 1 Procedure OT Treatments $Self Care/Home Management : 23-37 mins  Chrys Racer , MS, OTR/L, CLT Pager: [phone]  05/24/2015, 10:58 AM

## 2015-05-24 NOTE — Discharge Summary (Signed)
Patient ID: [name] MRN: [id number] DOB/AGE: 01-17-1966 50 y.o.  Admit date: 05/22/2015 Discharge date: 05/24/2015  Admission Diagnoses:  Principal Problem:   Primary osteoarthritis of left hip   Discharge Diagnoses:  Same  Past Medical History  Diagnosis Date  . Anxiety   . Depression     bipolar [facility]  . Hypogonadism male   . ADD (attention deficit disorder)   . Microscopic hematuria     hereditary s/p Urology eval  . Bipolar 1 disorder ([place])   . Arthritis     right hip  . Family history of anesthesia complication     pt is unsure , but pt father may have been difficult to arouse   . Pneumonia 10-2012  . Blood in urine   . Hypertension   . PTSD (post-traumatic stress disorder)     SOCIAL ANXIETY DISORDER   . History of kidney stones   . Creatinine elevation   . Liver fatty degeneration   . Diabetes mellitus without complication ([place])     Surgeries: Procedure(s): TOTAL HIP ARTHROPLASTY ANTERIOR APPROACH on 05/22/2015   Consultants:    Discharged Condition: Improved  Hospital Course: [name] is an 50 y.o. male who was admitted 05/22/2015 for operative treatment ofPrimary osteoarthritis of left hip. Patient has severe unremitting pain that affects sleep, daily activities, and work/hobbies. After pre-op clearance the patient was taken to the operating room on 05/22/2015 and underwent  Procedure(s): TOTAL HIP ARTHROPLASTY ANTERIOR APPROACH.    Patient was given perioperative antibiotics: Anti-infectives    Start     Dose/Rate Route Frequency Ordered Stop   05/22/15 1800  ceFAZolin (ANCEF) IVPB 2 g/50 mL premix     2 g 100 mL/hr over 30 Minutes Intravenous Every 8 hours 05/22/15 1651 05/23/15 0234   05/22/15 0845  ceFAZolin (ANCEF) 3 g in dextrose 5 % 50 mL IVPB     3 g 130 mL/hr over 30 Minutes Intravenous To [facility] 05/21/15 0930 05/22/15 0947       Patient was given sequential compression devices, early ambulation, and  chemoprophylaxis to prevent DVT.  Patient benefited maximally from hospital stay and there were no complications.    Recent vital signs: Patient Vitals for the past 24 hrs:  BP Temp Temp src Pulse Resp SpO2  05/24/15 0531 125/63 mmHg 99.3 F (37.4 C) Oral (!) 101 16 95 %  05/23/15 2151 124/67 mmHg 99.8 F (37.7 C) Oral (!) 106 16 96 %     Recent laboratory studies: No results for input(s): WBC, HGB, HCT, PLT, NA, K, CL, CO2, BUN, CREATININE, GLUCOSE, INR, CALCIUM in the last 72 hours.  Invalid input(s): PT, 2   Discharge Medications:     Medication List    STOP taking these medications        HYDROcodone-acetaminophen 5-325 MG tablet  Commonly known as:  NORCO/VICODIN      TAKE these medications        aspirin 325 MG EC tablet  Take 1 tablet (325 mg total) by mouth 2 (two) times daily after a meal.     b complex vitamins tablet  Take 1 tablet by mouth daily.     buPROPion 300 MG 24 hr tablet  Commonly known as:  WELLBUTRIN XL  Take 1 tablet by mouth daily.     cholecalciferol 1000 units tablet  Commonly known as:  VITAMIN D  Take 5,000 Units by mouth daily.     clonazePAM 2 MG tablet  Commonly known as:  KLONOPIN  Take 2 mg by mouth 2 (two) times daily as needed for anxiety.     divalproex 500 MG 24 hr tablet  Commonly known as:  DEPAKOTE ER  Take 1,500 mg by mouth at bedtime.     emtricitabine-tenofovir 200-300 MG tablet  Commonly known as:  TRUVADA  Take 1 tablet by mouth daily.     eszopiclone 3 MG Tabs  Generic drug:  Eszopiclone  Take 3 mg by mouth at bedtime. Take immediately before bedtime     Icosapent Ethyl 1 g Caps  Commonly known as:  VASCEPA  Take 2 capsules by mouth 2 (two) times daily.     losartan-hydrochlorothiazide 100-12.5 MG tablet  Commonly known as:  HYZAAR  Take 1 tablet by mouth daily.     metFORMIN 1000 MG tablet  Commonly known as:  GLUCOPHAGE  Take 1 tablet (1,000 mg total) by mouth 2 (two) times daily with a meal.      methocarbamol 500 MG tablet  Commonly known as:  ROBAXIN  Take 1 tablet (500 mg total) by mouth every 6 (six) hours as needed for muscle spasms.     mirtazapine 45 MG tablet  Commonly known as:  REMERON  Take 1 tablet (45 mg total) by mouth at bedtime.     multivitamin with minerals Tabs tablet  Take 1 tablet by mouth every morning.     oxyCODONE-acetaminophen 5-325 MG tablet  Commonly known as:  PERCOCET/ROXICET  Take 1-2 tablets by mouth every 4 (four) hours as needed for moderate pain or severe pain.     QUEtiapine 400 MG tablet  Commonly known as:  SEROQUEL  Take 1 tablet by mouth 2 (two) times daily.     tadalafil 20 MG tablet  Commonly known as:  CIALIS  Take 1 tablet (20 mg total) by mouth daily as needed for erectile dysfunction.     testosterone cypionate 200 MG/ML injection  Commonly known as:  DEPOTESTOSTERONE CYPIONATE  Inject 1.5 mLs (300 mg total) into the muscle every 14 (fourteen) days.        Diagnostic Studies: Dg Chest 2 View  05/11/2015  CLINICAL DATA:  50 year old male under preoperative evaluation for hip replacement on 05/22/2015. EXAM: CHEST  2 VIEW COMPARISON:  Chest x-ray 08/09/2013. FINDINGS: Chronic blunting of the left costophrenic sulcus, presumably related to a post infectious or inflammatory scarring. Linear opacity in the lingula increased compared to the prior examination, likely part of this scarring. Right lung is clear. No pleural effusions. No evidence of pulmonary edema. No pneumothorax. No suspicious appearing pulmonary nodules or masses. Heart size is normal. Upper mediastinal contours are within normal limits. IMPRESSION: 1. Increasing pleuroparenchymal scarring in the lingula and left costophrenic sulcus, as above. 2. No radiographic evidence of acute cardiopulmonary disease. Electronically Signed   By: Vinnie Langton M.D.   On: 05/11/2015 19:43   Dg Hip Operative Unilat With Pelvis Left  05/22/2015  CLINICAL DATA:  Left hip replacement  EXAM: OPERATIVE left HIP (WITH PELVIS IF PERFORMED) 2 VIEWS TECHNIQUE: Fluoroscopic spot image(s) were submitted for interpretation post-operatively. COMPARISON:  None. FINDINGS: Left hip replacement is noted.  No acute bony abnormality is seen. IMPRESSION: Left hip replacement without acute abnormality. Electronically Signed   By: Inez Catalina M.D.   On: 05/22/2015 11:56    Disposition: 01-Home or Self Care      Discharge Instructions    Call MD / Call 911    Complete by:  As directed   If you experience chest pain or shortness of breath, CALL 911 and be transported to the hospital emergency room.  If you develope a fever above 101 F, pus (white drainage) or increased drainage or redness at the wound, or calf pain, call your surgeon's office.     Constipation Prevention    Complete by:  As directed   Drink plenty of fluids.  Prune juice may be helpful.  You may use a stool softener, such as Colace (over the counter) 100 mg twice a day.  Use MiraLax (over the counter) for constipation as needed.     Diet - low sodium heart healthy    Complete by:  As directed      Discharge instructions    Complete by:  As directed   INSTRUCTIONS AFTER JOINT REPLACEMENT   Remove items at home which could result in a fall. This includes throw rugs or furniture in walking pathways ICE to the affected joint every three hours while awake for 30 minutes at a time, for at least the first 3-5 days, and then as needed for pain and swelling.  Continue to use ice for pain and swelling. You may notice swelling that will progress down to the foot and ankle.  This is normal after surgery.  Elevate your leg when you are not up walking on it.   Continue to use the breathing machine you got in the hospital (incentive spirometer) which will help keep your temperature down.  It is common for your temperature to cycle up and down following surgery, especially at night when you are not up moving around and exerting yourself.  The  breathing machine keeps your lungs expanded and your temperature down.   DIET:  As you were doing prior to hospitalization, we recommend a well-balanced diet.  DRESSING / WOUND CARE / SHOWERING  You may shower 3 days after surgery, but keep the wounds dry during showering.  You may use an occlusive plastic wrap (Press'n Seal for example), NO SOAKING/SUBMERGING IN THE BATHTUB.  If the bandage gets wet, change with a clean dry gauze.  If the incision gets wet, pat the wound dry with a clean towel.  ACTIVITY  Increase activity slowly as tolerated, but follow the weight bearing instructions below.   No driving for 6 weeks or until further direction given by your physician.  You cannot drive while taking narcotics.  No lifting or carrying greater than 10 lbs. until further directed by your surgeon. Avoid periods of inactivity such as sitting longer than an hour when not asleep. This helps prevent blood clots.  You may return to work once you are authorized by your doctor.     WEIGHT BEARING   Weight bearing as tolerated with assist device (walker, cane, etc) as directed, use it as long as suggested by your surgeon or therapist, typically at least 4-6 weeks.   EXERCISES  Results after joint replacement surgery are often greatly improved when you follow the exercise, range of motion and muscle strengthening exercises prescribed by your doctor. Safety measures are also important to protect the joint from further injury. Any time any of these exercises cause you to have increased pain or swelling, decrease what you are doing until you are comfortable again and then slowly increase them. If you have problems or questions, call your caregiver or physical therapist for advice.   Rehabilitation is important following a joint replacement. After just a few days of immobilization, the muscles  of the leg can become weakened and shrink (atrophy).  These exercises are designed to build up the tone and  strength of the thigh and leg muscles and to improve motion. Often times heat used for twenty to thirty minutes before working out will loosen up your tissues and help with improving the range of motion but do not use heat for the first two weeks following surgery (sometimes heat can increase post-operative swelling).   These exercises can be done on a training (exercise) mat, on the floor, on a table or on a bed. Use whatever works the best and is most comfortable for you.    Use music or television while you are exercising so that the exercises are a pleasant break in your day. This will make your life better with the exercises acting as a break in your routine that you can look forward to.   Perform all exercises about fifteen times, three times per day or as directed.  You should exercise both the operative leg and the other leg as well.   Exercises include:   Quad Sets - Tighten up the muscle on the front of the thigh (Quad) and hold for 5-10 seconds.   Straight Leg Raises - With your knee straight (if you were given a brace, keep it on), lift the leg to 60 degrees, hold for 3 seconds, and slowly lower the leg.  Perform this exercise against resistance later as your leg gets stronger.  Leg Slides: Lying on your back, slowly slide your foot toward your buttocks, bending your knee up off the floor (only go as far as is comfortable). Then slowly slide your foot back down until your leg is flat on the floor again.  Angel Wings: Lying on your back spread your legs to the side as far apart as you can without causing discomfort.  Hamstring Strength:  Lying on your back, push your heel against the floor with your leg straight by tightening up the muscles of your buttocks.  Repeat, but this time bend your knee to a comfortable angle, and push your heel against the floor.  You may put a pillow under the heel to make it more comfortable if necessary.   A rehabilitation program following joint replacement  surgery can speed recovery and prevent re-injury in the future due to weakened muscles. Contact your doctor or a physical therapist for more information on knee rehabilitation.    CONSTIPATION  Constipation is defined medically as fewer than three stools per week and severe constipation as less than one stool per week.  Even if you have a regular bowel pattern at home, your normal regimen is likely to be disrupted due to multiple reasons following surgery.  Combination of anesthesia, postoperative narcotics, change in appetite and fluid intake all can affect your bowels.   YOU MUST use at least one of the following options; they are listed in order of increasing strength to get the job done.  They are all available over the counter, and you may need to use some, POSSIBLY even all of these options:    Drink plenty of fluids (prune juice may be helpful) and high fiber foods Colace 100 mg by mouth twice a day  Senokot for constipation as directed and as needed Dulcolax (bisacodyl), take with full glass of water  Miralax (polyethylene glycol) once or twice a day as needed.  If you have tried all these things and are unable to have a bowel movement in the first  3-4 days after surgery call either your surgeon or your primary doctor.    If you experience loose stools or diarrhea, hold the medications until you stool forms back up.  If your symptoms do not get better within 1 week or if they get worse, check with your doctor.  If you experience "the worst abdominal pain ever" or develop nausea or vomiting, please contact the office immediately for further recommendations for treatment.   ITCHING:  If you experience itching with your medications, try taking only a single pain pill, or even half a pain pill at a time.  You can also use Benadryl over the counter for itching or also to help with sleep.   TED HOSE STOCKINGS:  Use stockings on both legs until for at least 2 weeks or as directed by physician  office. They may be removed at night for sleeping.  MEDICATIONS:  See your medication summary on the "After Visit Summary" that nursing will review with you.  You may have some home medications which will be placed on hold until you complete the course of blood thinner medication.  It is important for you to complete the blood thinner medication as prescribed.  PRECAUTIONS:  If you experience chest pain or shortness of breath - call 911 immediately for transfer to the hospital emergency department.   If you develop a fever greater that 101 F, purulent drainage from wound, increased redness or drainage from wound, foul odor from the wound/dressing, or calf pain - CONTACT YOUR SURGEON.                                                   FOLLOW-UP APPOINTMENTS:  If you do not already have a post-op appointment, please call the office for an appointment to be seen by your surgeon.  Guidelines for how soon to be seen are listed in your "After Visit Summary", but are typically between 1-4 weeks after surgery.  OTHER INSTRUCTIONS:   Knee Replacement:  Do not place pillow under knee, focus on keeping the knee straight while resting. CPM instructions: 0-90 degrees, 2 hours in the morning, 2 hours in the afternoon, and 2 hours in the evening. Place foam block, curve side up under heel at all times except when in CPM or when walking.  DO NOT modify, tear, cut, or change the foam block in any way.  MAKE SURE YOU:  Understand these instructions.  Get help right away if you are not doing well or get worse.    Thank you for letting us be a part of your medical care team.  It is a privilege we respect greatly.  We hope these instructions will help you stay on track for a fast and full recovery!     Increase activity slowly as tolerated    Complete by:  As directed            Follow-up Information    Follow up with Hessie Dibble, MD. Schedule an appointment as soon as possible for a visit in 2 weeks.    Specialty:  Orthopedic Surgery   Contact information:   [address] [address] [phone]        Signed: Rich Fuchs 05/24/2015, 1:47 PM

## 2015-05-24 NOTE — Progress Notes (Signed)
Physical Therapy Treatment Patient Details Name: [name] MRN: [id number] DOB: 02/23/1966 Today's Date: 06-12-15    History of Present Illness 50 y.o. male now s/p Lt direct anterior THA. PMH: Rt THA, anxiety, depression, ADD, bipolar, PTSD, diabetes.     PT Comments    Provided review of negotiating stairs, and education on car transfer.  Patient reports being discharged.  Patient ready from PT perspective.  Follow Up Recommendations  Home health PT;Supervision for mobility/OOB     Equipment Recommendations  Rolling walker with 5" wheels    Recommendations for Other Services       Precautions / Restrictions Precautions Precautions: Fall Precaution Comments: direct anterior approach - no precautions Restrictions Weight Bearing Restrictions: Yes LLE Weight Bearing: Weight bearing as tolerated    Mobility  Bed Mobility                  Transfers                    Ambulation/Gait                 Stairs            Wheelchair Mobility    Modified Rankin (Stroke Patients Only)       Balance                                    Cognition Arousal/Alertness: Awake/alert Behavior During Therapy: WFL for tasks assessed/performed Overall Cognitive Status: Within Functional Limits for tasks assessed                      Exercises      General Comments General comments (skin integrity, edema, etc.): Reviewed negotiating stairs with patient.  Educated and demonstrated car transfers.        Pertinent Vitals/Pain Pain Assessment: 0-10 Pain Score: 7  Pain Location: Lt hip Pain Descriptors / Indicators: Aching Pain Intervention(s): Repositioned    Home Living                      Prior Function            PT Goals (current goals can now be found in the care plan section) Progress towards PT goals: Progressing toward goals    Frequency  7X/week    PT Plan Current plan remains appropriate     Co-evaluation             End of Session   Activity Tolerance: Patient tolerated treatment well Patient left: in bed;with call bell/phone within reach     Time: [id number] PT Time Calculation (min) (ACUTE ONLY): 11 min  Charges:  $Self Care/Home Management: Dec 30, 2022                    G Codes:      Despina Pole 12-Jun-2015, 5:40 PM  Carita Pian. Sanjuana Kava, [place] Pager [phone]

## 2015-05-24 NOTE — Progress Notes (Signed)
Physical Therapy Treatment Patient Details Name: [name] MRN: [id number] DOB: [birth year] Today's Date: 05/24/2015    History of Present Illness 50 y.o. male now s/p Lt direct anterior THA. PMH: Rt THA, anxiety, depression, ADD, bipolar, PTSD, diabetes.     PT Comments    Patient limited by pain this am.  Will return this pm for second PT session.  Follow Up Recommendations  Home health PT;Supervision for mobility/OOB     Equipment Recommendations  Rolling walker with 5" wheels    Recommendations for Other Services       Precautions / Restrictions Precautions Precautions: Fall Precaution Comments: direct anterior approach - no precautions Restrictions Weight Bearing Restrictions: Yes LLE Weight Bearing: Weight bearing as tolerated    Mobility  Bed Mobility Overal bed mobility: Needs Assistance Bed Mobility: Supine to Sit;Sit to Supine     Supine to sit: Supervision Sit to supine: Supervision   General bed mobility comments: Increased time for all mobility.  Transfers Overall transfer level: Needs assistance Equipment used: Rolling walker (2 wheeled) Transfers: Sit to/from Stand Sit to Stand: Min guard         General transfer comment: Verbal cues for hand placement.  Assist for safety.  Ambulation/Gait Ambulation/Gait assistance: Supervision Ambulation Distance (Feet): 200 Feet Assistive device: Rolling walker (2 wheeled) Gait Pattern/deviations: Step-through pattern;Decreased stance time - left;Decreased stride length;Decreased weight shift to left;Antalgic Gait velocity: decreased Gait velocity interpretation: Below normal speed for age/gender General Gait Details: Patient with heavy use of UE's on RW today due to increased pain.   Stairs            Wheelchair Mobility    Modified Rankin (Stroke Patients Only)       Balance Overall balance assessment: Needs assistance Sitting-balance support: Bilateral upper extremity supported;Feet  supported Sitting balance-Leahy Scale: Fair     Standing balance support: Bilateral upper extremity supported;During functional activity Standing balance-Leahy Scale: Poor Standing balance comment: relies heavily on RW for support                    Cognition Arousal/Alertness: Awake/alert Behavior During Therapy: WFL for tasks assessed/performed Overall Cognitive Status: Within Functional Limits for tasks assessed                      Exercises Total Joint Exercises Ankle Circles/Pumps: AROM;Both;10 reps;Supine Quad Sets: AROM;Left;10 reps;Supine Short Arc Quad: AROM;Left;10 reps;Supine Heel Slides: AROM;Left;10 reps;Supine Hip ABduction/ADduction: AROM;Left;10 reps;Supine    General Comments        Pertinent Vitals/Pain Pain Assessment: 0-10 Pain Score: 10-Worst pain ever Pain Location: Lt hip Pain Descriptors / Indicators: Aching;Sore Pain Intervention(s): Limited activity within patient's tolerance;Monitored during session;Premedicated before session    Home Living                      Prior Function            PT Goals (current goals can now be found in the care plan section) Acute Rehab PT Goals Patient Stated Goal: to get stronger and feel better Progress towards PT goals: Progressing toward goals    Frequency  7X/week    PT Plan Current plan remains appropriate    Co-evaluation             End of Session Equipment Utilized During Treatment: Gait belt Activity Tolerance: Patient tolerated treatment well;Patient limited by pain Patient left: in bed;with call bell/phone within reach  Time: [id number] PT Time Calculation (min) (ACUTE ONLY): 24 min  Charges:  $Gait Training: 8-22 mins $Therapeutic Exercise: 8-22 mins                    G Codes:      Despina Pole 05-31-2015, 1:52 PM Carita Pian. Sanjuana Kava, [place] Pager [phone]

## 2015-05-29 ENCOUNTER — Other Ambulatory Visit: Payer: Self-pay | Admitting: Internal Medicine

## 2015-05-29 MED ORDER — ATORVASTATIN CALCIUM 10 MG PO TABS: 10.0000 mg | ORAL_TABLET | Freq: Every day | ORAL | Status: DC

## 2015-05-31 ENCOUNTER — Other Ambulatory Visit: Payer: Self-pay | Admitting: Internal Medicine

## 2015-05-31 NOTE — Telephone Encounter (Signed)
pls advise if rf rq is appropriate

## 2015-06-04 ENCOUNTER — Telehealth: Payer: Self-pay

## 2015-06-04 NOTE — Telephone Encounter (Signed)
Per [employer], [place] is NOT required. Pharmacy advised of same

## 2015-06-04 NOTE — Telephone Encounter (Signed)
PA initiated via [employer] [phone]

## 2015-06-06 ENCOUNTER — Other Ambulatory Visit: Payer: Self-pay | Admitting: Internal Medicine

## 2015-06-10 ENCOUNTER — Encounter: Payer: Self-pay | Admitting: Internal Medicine

## 2015-06-11 ENCOUNTER — Telehealth: Payer: Self-pay

## 2015-06-11 DIAGNOSIS — E291 Testicular hypofunction: Secondary | ICD-10-CM

## 2015-06-11 NOTE — Telephone Encounter (Signed)
Done

## 2015-06-11 NOTE — Telephone Encounter (Signed)
PA initiated via [facility]

## 2015-06-13 NOTE — Telephone Encounter (Signed)
Testosterone PA denied, please advise alternative treatment. Thanks!

## 2015-06-13 NOTE — Telephone Encounter (Signed)
There is no alternative rx. I can refer to Endo or Urology if needed Thx

## 2015-06-14 NOTE — Telephone Encounter (Signed)
Pt advised and states that whichever specialist AVP thought would be better is fine with him

## 2015-06-14 NOTE — Telephone Encounter (Signed)
OK. Thx

## 2015-06-16 ENCOUNTER — Other Ambulatory Visit: Payer: Self-pay | Admitting: Internal Medicine

## 2015-07-08 NOTE — Progress Notes (Signed)
Subjective:    Patient ID: [name], male    DOB: 09-01-1965, 50 y.o.   MRN: [id number]  HPI Pt reports he had puberty at the normal age.  He has no biological children.  He says he has never taken illicit androgens.  He took androgel from 2012-2014.  He then changed to depo-testosterone, due to lower cost.  He last took this approx 5 weeks ago (he stopped due to non-payment by insurance).  He wants to resume rx, and says ins will now pay.  He does not want to try clomid, as he prefers the injections.  take antiandrogens or opioids.  He denies any h/o infertility, XRT, or genital infection.  He has never had surgery, or a serious injury to the head or genital area.  He does not consume alcohol excessively.  Since he has been off depo-testosterone, he reports slightly decreased muscle strength throughout the body, and assoc emotional lability.  He is not sure about the dosage, but he thinks it is 150 mg every week (or 300 mg q 2 weeks).   Past Medical History  Diagnosis Date  . Anxiety   . Depression     bipolar [facility]  . Hypogonadism male   . ADD (attention deficit disorder)   . Microscopic hematuria     hereditary s/p Urology eval  . Bipolar 1 disorder ([place])   . Arthritis     right hip  . Family history of anesthesia complication     pt is unsure , but pt father may have been difficult to arouse   . Pneumonia 10-2012  . Blood in urine   . Hypertension   . PTSD (post-traumatic stress disorder)     SOCIAL ANXIETY DISORDER   . History of kidney stones   . Creatinine elevation   . Liver fatty degeneration   . Diabetes mellitus without complication [facility])     Past Surgical History  Procedure Laterality Date  . Lumbar disc surgery    . Back surgery    . Closed reduction metacarpal with percutaneous pinning Right   . Tonsillectomy    . Total hip arthroplasty Right 08/16/2013    Procedure: TOTAL HIP ARTHROPLASTY ANTERIOR APPROACH;  Surgeon: Hessie Dibble, MD;  Location:  [place];  Service: Orthopedics;  Laterality: Right;  . Total hip arthroplasty Left 05/22/2015    Procedure: TOTAL HIP ARTHROPLASTY ANTERIOR APPROACH;  Surgeon: Melrose Nakayama, MD;  Location: [place];  Service: Orthopedics;  Laterality: Left;    Social History   Social History  . Marital Status: Single    Spouse Name: N/A  . Number of Children: N/A  . Years of Education: N/A   Occupational History  . [employer] manager    Social History Main Topics  . Smoking status: Former Smoker -- 25 years    Types: Cigarettes    Quit date: 02/19/2014  . Smokeless tobacco: Never Used  . Alcohol Use: No  . Drug Use: No  . Sexual Activity: Not Currently   Other Topics Concern  . Not on file   Social History Narrative   regualar exercise-no    Current Outpatient Prescriptions on File Prior to Visit  Medication Sig Dispense Refill  . atorvastatin (LIPITOR) 10 MG tablet Take 1 tablet (10 mg total) by mouth daily. 90 tablet 3  . buPROPion (WELLBUTRIN XL) 300 MG 24 hr tablet Take 1 tablet by mouth daily. 300 mg in the morning and 150 mg in the evening.    Marland Kitchen  cholecalciferol (VITAMIN D) 1000 UNITS tablet Take 5,000 Units by mouth daily.     . clonazePAM (KLONOPIN) 2 MG tablet Take 2 mg by mouth 2 (two) times daily as needed for anxiety.     . divalproex (DEPAKOTE ER) 500 MG 24 hr tablet Take 1,500 mg by mouth at bedtime.     Marland Kitchen emtricitabine-tenofovir (TRUVADA) 200-300 MG tablet Take 1 tablet by mouth daily. 90 tablet 3  . Eszopiclone (ESZOPICLONE) 3 MG TABS Take 3 mg by mouth at bedtime. Take immediately before bedtime    . losartan-hydrochlorothiazide (HYZAAR) 100-12.5 MG tablet Take 1 tablet by mouth daily. 90 tablet 3  . metFORMIN (GLUCOPHAGE) 1000 MG tablet Take 1 tablet (1,000 mg total) by mouth 2 (two) times daily with a meal. 180 tablet 1  . mirtazapine (REMERON) 45 MG tablet Take 1 tablet (45 mg total) by mouth at bedtime. 30 tablet 5  . Multiple Vitamin (MULTIVITAMIN WITH MINERALS) TABS Take 1  tablet by mouth every morning.    Marland Kitchen QUEtiapine (SEROQUEL) 400 MG tablet Take 1 tablet by mouth 2 (two) times daily.    . TRUVADA 200-300 MG tablet TAKE 1 TABLET DAILY 90 tablet 3  . aspirin 325 MG EC tablet Take 1 tablet (325 mg total) by mouth 2 (two) times daily after a meal. (Patient not taking: Reported on 07/09/2015) 60 tablet 0  . b complex vitamins tablet Take 1 tablet by mouth daily. Reported on 07/09/2015    . Icosapent Ethyl (VASCEPA) 1 g CAPS Take 2 capsules by mouth 2 (two) times daily. (Patient not taking: Reported on 05/10/2015) 360 capsule 3  . methocarbamol (ROBAXIN) 500 MG tablet Take 1 tablet (500 mg total) by mouth every 6 (six) hours as needed for muscle spasms. (Patient not taking: Reported on 07/09/2015) 50 tablet 0  . oxyCODONE-acetaminophen (PERCOCET/ROXICET) 5-325 MG tablet Take 1-2 tablets by mouth every 4 (four) hours as needed for moderate pain or severe pain. (Patient not taking: Reported on 07/09/2015) 50 tablet 0   No current facility-administered medications on file prior to visit.    No Known Allergies  Family History  Problem Relation Age of Onset  . Diabetes Father   . Cancer Mother     died of melanoma with mets  . Cervical cancer Sister   . Diabetes Sister   . Other Neg Hx     hypogonadism    BP 122/80 mmHg  Pulse 88  Temp(Src) 97.5 F (36.4 C) (Oral)  Ht 6\' 4"  (1.93 m)  Wt 213 lb (96.616 kg)  BMI 25.94 kg/m2  SpO2 95%  Review of Systems denies depression, weight change, decreased urinary stream, gynecomastia, myalgias, fever, headache, easy bruising, sob, rash, blurry vision, rhinorrhea, chest pain.  He has intermittent ED sxs.  He has intermittent numbness of the feet.  He has lost weight.  Anxiety is well-controlled.      Objective:   Physical Exam VS: see vs page GEN: no distress HEAD: head: no deformity eyes: no periorbital swelling, no proptosis.  external nose and ears are normal mouth: no lesion seen NECK: supple, thyroid is not  enlarged.   CHEST WALL: no deformity LUNGS: clear to auscultation BREASTS:  No gynecomastia CV: reg rate and rhythm, no murmur ABD: abdomen is soft, nontender.  no hepatosplenomegaly.  not distended.  no hernia GENITALIA:  Normal male penis.  Testicles are very small and soft MUSCULOSKELETAL: muscle bulk and strength are grossly normal.  no obvious joint swelling.  gait is normal  and steady.  Old healed surgical scars at both anterior hips.  EXTEMITIES: no deformity.  no edema PULSES:  no carotid bruit NEURO:  cn 2-12 grossly intact.   readily moves all 4's.  sensation is intact to touch on all 4's SKIN:  Normal texture and temperature.  No rash or suspicious lesion is visible.   NODES:  None palpable at the neck.   PSYCH: alert, well-oriented.  Does not appear anxious nor depressed.   Testosterone (2012, prior to rx)= 247  Lab Results  Component Value Date   TESTOSTERONE 649.06 04/18/2015   Lab Results  Component Value Date   TSH 1.55 04/18/2015   I have reviewed outside records, and summarized: Pt was noted to have low testosterone, and referred here.  i personally reviewed electrocardiogram tracing (05/11/15): Indication: preop.  Impression: ST    Assessment & Plan:  Low testosterone, new to me, uncertain etiology.   Polycythemia, due to testosterone injections.    Patient is advised the following: Patient Instructions  Testosterone treatment has risks, including increased or decreased fertility (depending on the type of treatment), hair loss, prostate cancer, benign prostate enlargement, blood clots, liver problems, lower hdl ("good cholesterol"), polycythemia (opposite of anemia), sleep apnea, and behavior changes. Here is a prescription for 100 mg every week, which is the maximin safe dosage. Please repeat the blood tests in 2 months. i have sent a prescription to [facility], for the ED symptoms.  Please return in 1 year.

## 2015-07-09 ENCOUNTER — Encounter: Payer: Self-pay | Admitting: Endocrinology

## 2015-07-09 ENCOUNTER — Ambulatory Visit (INDEPENDENT_AMBULATORY_CARE_PROVIDER_SITE_OTHER): Payer: No Typology Code available for payment source | Admitting: Endocrinology

## 2015-07-09 VITALS — BP 122/80 | HR 88 | Temp 97.5°F | Ht 76.0 in | Wt 213.0 lb

## 2015-07-09 DIAGNOSIS — E291 Testicular hypofunction: Secondary | ICD-10-CM | POA: Diagnosis not present

## 2015-07-09 MED ORDER — TESTOSTERONE CYPIONATE 200 MG/ML IM SOLN: 100.0000 mg | INTRAMUSCULAR | Status: DC

## 2015-07-09 MED ORDER — SILDENAFIL CITRATE 20 MG PO TABS: ORAL_TABLET | ORAL | Status: DC

## 2015-07-09 NOTE — Patient Instructions (Addendum)
Testosterone treatment has risks, including increased or decreased fertility (depending on the type of treatment), hair loss, prostate cancer, benign prostate enlargement, blood clots, liver problems, lower hdl ("good cholesterol"), polycythemia (opposite of anemia), sleep apnea, and behavior changes. Here is a prescription for 100 mg every week, which is the maximin safe dosage. Please repeat the blood tests in 2 months. i have sent a prescription to [facility], for the ED symptoms.  Please return in 1 year.

## 2015-07-10 ENCOUNTER — Telehealth: Payer: Self-pay | Admitting: Endocrinology

## 2015-07-10 DIAGNOSIS — E291 Testicular hypofunction: Secondary | ICD-10-CM

## 2015-07-10 NOTE — Telephone Encounter (Signed)
Pt pharmacy called and said that they need more specification as to how often Pt will take these pills [phone]

## 2015-07-10 NOTE — Telephone Encounter (Signed)
See note below and please advise, Thanks! 

## 2015-07-10 NOTE — Telephone Encounter (Signed)
i am fine with the pills, but this would need to come in for labs first--ok with you?

## 2015-07-10 NOTE — Telephone Encounter (Signed)
I contacted the pt and advised of note below. Pt agreed to coming in for lab work.

## 2015-07-10 NOTE — Telephone Encounter (Signed)
Pt said that the testosterone injection was not covered under insurance and that Dr. Loanne Drilling mentioned some pills that he could also take.  Pt would like those to be called into [facility] at [facility]

## 2015-07-10 NOTE — Telephone Encounter (Signed)
Ok, i ordered 

## 2015-07-11 ENCOUNTER — Telehealth: Payer: Self-pay | Admitting: Endocrinology

## 2015-07-11 ENCOUNTER — Other Ambulatory Visit (INDEPENDENT_AMBULATORY_CARE_PROVIDER_SITE_OTHER): Payer: No Typology Code available for payment source

## 2015-07-11 DIAGNOSIS — E291 Testicular hypofunction: Secondary | ICD-10-CM

## 2015-07-11 LAB — LUTEINIZING HORMONE: LH: 1.29 m[IU]/mL — AB (ref 1.50–9.30)

## 2015-07-11 LAB — FOLLICLE STIMULATING HORMONE: FSH: 2.2 m[IU]/mL (ref 1.4–18.1)

## 2015-07-11 NOTE — Telephone Encounter (Signed)
I contacted the pt and left a vm advising we are waiting on the lab work to come back. Pt was advised once we get the results we will notify him and send the blood tests in.

## 2015-07-11 NOTE — Telephone Encounter (Signed)
Patient would like to have testosterone pills sent to his pharmacy    Pharmacy: [facility] in [place]  Can we contact patient when complete so he can go to pharmacy    Thank you

## 2015-07-12 LAB — PROLACTIN: Prolactin: 4.6 ng/mL (ref 2.0–18.0)

## 2015-07-13 ENCOUNTER — Other Ambulatory Visit: Payer: Self-pay

## 2015-07-13 ENCOUNTER — Telehealth: Payer: Self-pay | Admitting: Endocrinology

## 2015-07-13 ENCOUNTER — Other Ambulatory Visit: Payer: Self-pay | Admitting: Endocrinology

## 2015-07-13 LAB — TESTOSTERONE,FREE AND TOTAL
TESTOSTERONE: 92 ng/dL — AB (ref 348–1197)
Testosterone, Free: 4.4 pg/mL — ABNORMAL LOW (ref 6.8–21.5)

## 2015-07-13 MED ORDER — CLOMIPHENE CITRATE 50 MG PO TABS: ORAL_TABLET | ORAL | Status: DC

## 2015-07-13 NOTE — Telephone Encounter (Signed)
Please send rx to [facility] to get the clomid updated

## 2015-07-13 NOTE — Telephone Encounter (Signed)
Rx submitted

## 2015-07-16 ENCOUNTER — Ambulatory Visit (INDEPENDENT_AMBULATORY_CARE_PROVIDER_SITE_OTHER): Payer: BLUE CROSS/BLUE SHIELD | Admitting: Internal Medicine

## 2015-07-16 ENCOUNTER — Other Ambulatory Visit (INDEPENDENT_AMBULATORY_CARE_PROVIDER_SITE_OTHER): Payer: BLUE CROSS/BLUE SHIELD

## 2015-07-16 ENCOUNTER — Encounter: Payer: Self-pay | Admitting: Internal Medicine

## 2015-07-16 VITALS — BP 100/70 | HR 102 | Wt 214.0 lb

## 2015-07-16 DIAGNOSIS — E291 Testicular hypofunction: Secondary | ICD-10-CM

## 2015-07-16 DIAGNOSIS — IMO0001 Reserved for inherently not codable concepts without codable children: Secondary | ICD-10-CM

## 2015-07-16 DIAGNOSIS — I10 Essential (primary) hypertension: Secondary | ICD-10-CM | POA: Diagnosis not present

## 2015-07-16 DIAGNOSIS — E1165 Type 2 diabetes mellitus with hyperglycemia: Secondary | ICD-10-CM

## 2015-07-16 DIAGNOSIS — M160 Bilateral primary osteoarthritis of hip: Secondary | ICD-10-CM

## 2015-07-16 LAB — CBC WITH DIFFERENTIAL/PLATELET
BASOS ABS: 0 10*3/uL (ref 0.0–0.1)
Basophils Relative: 0.5 % (ref 0.0–3.0)
Eosinophils Absolute: 0.1 10*3/uL (ref 0.0–0.7)
Eosinophils Relative: 0.8 % (ref 0.0–5.0)
HCT: 44.9 % (ref 39.0–52.0)
Hemoglobin: 15.5 g/dL (ref 13.0–17.0)
LYMPHS ABS: 2.6 10*3/uL (ref 0.7–4.0)
Lymphocytes Relative: 37.6 % (ref 12.0–46.0)
MCHC: 34.5 g/dL (ref 30.0–36.0)
MCV: 93.6 fl (ref 78.0–100.0)
MONO ABS: 0.5 10*3/uL (ref 0.1–1.0)
MONOS PCT: 7.9 % (ref 3.0–12.0)
NEUTROS ABS: 3.6 10*3/uL (ref 1.4–7.7)
NEUTROS PCT: 53.2 % (ref 43.0–77.0)
PLATELETS: 166 10*3/uL (ref 150.0–400.0)
RBC: 4.8 Mil/uL (ref 4.22–5.81)
RDW: 13.6 % (ref 11.5–15.5)
WBC: 6.8 10*3/uL (ref 4.0–10.5)

## 2015-07-16 LAB — BASIC METABOLIC PANEL
BUN: 49 mg/dL — AB (ref 6–23)
CALCIUM: 9.8 mg/dL (ref 8.4–10.5)
CO2: 23 meq/L (ref 19–32)
CREATININE: 2.81 mg/dL — AB (ref 0.40–1.50)
Chloride: 101 mEq/L (ref 96–112)
GFR: 25.77 mL/min — AB (ref >60.00)
GLUCOSE: 121 mg/dL — AB (ref 70–99)
Potassium: 4.2 mEq/L (ref 3.5–5.1)
Sodium: 136 mEq/L (ref 135–145)

## 2015-07-16 LAB — HEMOGLOBIN A1C: HEMOGLOBIN A1C: 5.5 % (ref 4.6–6.5)

## 2015-07-16 LAB — TESTOSTERONE: Testosterone: 124.26 ng/dL — ABNORMAL LOW (ref 300.00–890.00)

## 2015-07-16 NOTE — Assessment & Plan Note (Signed)
Suboptimal control Losartan-HCT

## 2015-07-16 NOTE — Assessment & Plan Note (Signed)
On Testosterone - not covered. On Clomiphene now

## 2015-07-16 NOTE — Assessment & Plan Note (Signed)
Metformin 

## 2015-07-16 NOTE — Assessment & Plan Note (Signed)
Pt had a L THR 05/22/15 Dr Rhona Raider

## 2015-07-16 NOTE — Progress Notes (Signed)
Subjective:  Patient ID: [name], male    DOB: [birth year]  Age: 50 y.o. MRN: [id number]  CC: No chief complaint on file.   HPI MASUD WILKINS presents for OA, LBP, hip pain. Pt had a L THR 05/22/15 Dr Rhona Raider  Outpatient Prescriptions Prior to Visit  Medication Sig Dispense Refill  . aspirin 325 MG EC tablet Take 1 tablet (325 mg total) by mouth 2 (two) times daily after a meal. 60 tablet 0  . atorvastatin (LIPITOR) 10 MG tablet Take 1 tablet (10 mg total) by mouth daily. 90 tablet 3  . b complex vitamins tablet Take 1 tablet by mouth daily. Reported on 07/09/2015    . buPROPion (WELLBUTRIN XL) 300 MG 24 hr tablet Take 1 tablet by mouth daily. 300 mg in the morning and 150 mg in the evening.    . cholecalciferol (VITAMIN D) 1000 UNITS tablet Take 5,000 Units by mouth daily.     . clomiPHENE (CLOMID) 50 MG tablet 1/4 tab daily 15 tablet 2  . clonazePAM (KLONOPIN) 2 MG tablet Take 2 mg by mouth 2 (two) times daily as needed for anxiety.     . divalproex (DEPAKOTE ER) 500 MG 24 hr tablet Take 1,500 mg by mouth at bedtime.     Marland Kitchen emtricitabine-tenofovir (TRUVADA) 200-300 MG tablet Take 1 tablet by mouth daily. 90 tablet 3  . Eszopiclone (ESZOPICLONE) 3 MG TABS Take 3 mg by mouth at bedtime. Take immediately before bedtime    . Icosapent Ethyl (VASCEPA) 1 g CAPS Take 2 capsules by mouth 2 (two) times daily. 360 capsule 3  . losartan-hydrochlorothiazide (HYZAAR) 100-12.5 MG tablet Take 1 tablet by mouth daily. 90 tablet 3  . metFORMIN (GLUCOPHAGE) 1000 MG tablet Take 1 tablet (1,000 mg total) by mouth 2 (two) times daily with a meal. 180 tablet 1  . methocarbamol (ROBAXIN) 500 MG tablet Take 1 tablet (500 mg total) by mouth every 6 (six) hours as needed for muscle spasms. 50 tablet 0  . mirtazapine (REMERON) 45 MG tablet Take 1 tablet (45 mg total) by mouth at bedtime. 30 tablet 5  . Multiple Vitamin (MULTIVITAMIN WITH MINERALS) TABS Take 1 tablet by mouth every morning.    Marland Kitchen  oxyCODONE-acetaminophen (PERCOCET/ROXICET) 5-325 MG tablet Take 1-2 tablets by mouth every 4 (four) hours as needed for moderate pain or severe pain. 50 tablet 0  . QUEtiapine (SEROQUEL) 400 MG tablet Take 1 tablet by mouth 2 (two) times daily.    . sildenafil (REVATIO) 20 MG tablet 1-5 pills, as needed for ED symptoms 60 tablet 5  . testosterone cypionate (DEPOTESTOSTERONE CYPIONATE) 200 MG/ML injection Inject 0.5 mLs (100 mg total) into the muscle once a week. 10 mL 0  . TRUVADA 200-300 MG tablet TAKE 1 TABLET DAILY 90 tablet 3   No facility-administered medications prior to visit.    ROS Review of Systems  Constitutional: Positive for fatigue. Negative for appetite change and unexpected weight change.  HENT: Negative for congestion, nosebleeds, sneezing, sore throat and trouble swallowing.   Eyes: Negative for itching and visual disturbance.  Respiratory: Negative for cough.   Cardiovascular: Negative for chest pain, palpitations and leg swelling.  Gastrointestinal: Negative for nausea, diarrhea, blood in stool and abdominal distention.  Genitourinary: Negative for frequency and hematuria.  Musculoskeletal: Positive for back pain, arthralgias and gait problem. Negative for joint swelling and neck pain.  Skin: Negative for rash.  Neurological: Negative for dizziness, tremors, speech difficulty and weakness.  Psychiatric/Behavioral:  Negative for sleep disturbance, dysphoric mood and agitation. The patient is not nervous/anxious.     Objective:  BP 100/70 mmHg  Pulse 102  Wt 214 lb (97.07 kg)  SpO2 97%  BP Readings from Last 3 Encounters:  07/16/15 100/70  07/09/15 122/80  05/24/15 125/63    Wt Readings from Last 3 Encounters:  07/16/15 214 lb (97.07 kg)  07/09/15 213 lb (96.616 kg)  05/22/15 231 lb (104.781 kg)    Physical Exam  Constitutional: He is oriented to person, place, and time. He appears well-developed. No distress.  NAD  HENT:  Mouth/Throat: Oropharynx is  clear and moist.  Eyes: Conjunctivae are normal. Pupils are equal, round, and reactive to light.  Neck: Normal range of motion. No JVD present. No thyromegaly present.  Cardiovascular: Normal rate, regular rhythm, normal heart sounds and intact distal pulses.  Exam reveals no gallop and no friction rub.   No murmur heard. Pulmonary/Chest: Effort normal and breath sounds normal. No respiratory distress. He has no wheezes. He has no rales. He exhibits no tenderness.  Abdominal: Soft. Bowel sounds are normal. He exhibits no distension and no mass. There is no tenderness. There is no rebound and no guarding.  Musculoskeletal: Normal range of motion. He exhibits tenderness. He exhibits no edema.  Lymphadenopathy:    He has no cervical adenopathy.  Neurological: He is alert and oriented to person, place, and time. He has normal reflexes. No cranial nerve deficit. He exhibits normal muscle tone. He displays a negative Romberg sign. Coordination and gait normal.  Skin: Skin is warm and dry. No rash noted.  Psychiatric: He has a normal mood and affect. His behavior is normal. Judgment and thought content normal.    Lab Results  Component Value Date   WBC 8.9 05/11/2015   HGB 15.9 05/11/2015   HCT 45.2 05/11/2015   PLT 133* 05/11/2015   GLUCOSE 169* 05/11/2015   CHOL 224* 04/18/2015   TRIG * 04/18/2015    729.0 Triglyceride is over 400; calculations on Lipids are invalid.   HDL 30.10* 04/18/2015   LDLDIRECT 102.0 04/18/2015   LDLCALC 84 12/11/2006   ALT 46 04/18/2015   AST 43* 04/18/2015   NA 141 05/11/2015   K 4.6 05/11/2015   CL 104 05/11/2015   CREATININE 2.34* 05/11/2015   BUN 24* 05/11/2015   CO2 25 05/11/2015   TSH 1.55 04/18/2015   PSA 2.46 04/18/2015   INR 1.13 05/11/2015   HGBA1C 6.8* 04/18/2015   MICROALBUR 15.8* 04/18/2015    Dg Chest 2 View  05/11/2015  CLINICAL DATA:  50 year old male under preoperative evaluation for hip replacement on 05/22/2015. EXAM: CHEST  2 VIEW  COMPARISON:  Chest x-ray 08/09/2013. FINDINGS: Chronic blunting of the left costophrenic sulcus, presumably related to a post infectious or inflammatory scarring. Linear opacity in the lingula increased compared to the prior examination, likely part of this scarring. Right lung is clear. No pleural effusions. No evidence of pulmonary edema. No pneumothorax. No suspicious appearing pulmonary nodules or masses. Heart size is normal. Upper mediastinal contours are within normal limits. IMPRESSION: 1. Increasing pleuroparenchymal scarring in the lingula and left costophrenic sulcus, as above. 2. No radiographic evidence of acute cardiopulmonary disease. Electronically Signed   By: Vinnie Langton M.D.   On: 05/11/2015 19:43    Assessment & Plan:   There are no diagnoses linked to this encounter. I am having Mr. Legore maintain his cholecalciferol, multivitamin with minerals, clonazePAM, mirtazapine, divalproex, buPROPion, QUEtiapine, emtricitabine-tenofovir, Icosapent  Ethyl, b complex vitamins, Eszopiclone, losartan-hydrochlorothiazide, aspirin, methocarbamol, oxyCODONE-acetaminophen, atorvastatin, TRUVADA, metFORMIN, testosterone cypionate, sildenafil, clomiPHENE, buPROPion, and traMADol.  Meds ordered this encounter  Medications  . buPROPion (WELLBUTRIN XL) 150 MG 24 hr tablet    Sig: Take 1 tablet by mouth at bedtime.    Refill:  1  . traMADol (ULTRAM) 50 MG tablet    Sig: as needed.    Refill:  0     Follow-up: No Follow-up on file.  [name], MD

## 2015-07-16 NOTE — Progress Notes (Signed)
Pre visit review using our clinic review tool, if applicable. No additional management support is needed unless otherwise documented below in the visit note. 

## 2015-07-17 ENCOUNTER — Encounter: Payer: Self-pay | Admitting: Internal Medicine

## 2015-08-16 ENCOUNTER — Encounter: Payer: Self-pay | Admitting: Endocrinology

## 2015-08-16 ENCOUNTER — Ambulatory Visit (INDEPENDENT_AMBULATORY_CARE_PROVIDER_SITE_OTHER): Payer: BLUE CROSS/BLUE SHIELD | Admitting: Endocrinology

## 2015-08-16 VITALS — BP 142/86 | HR 97 | Temp 97.7°F | Ht 76.0 in | Wt 208.0 lb

## 2015-08-16 DIAGNOSIS — E291 Testicular hypofunction: Secondary | ICD-10-CM

## 2015-08-16 LAB — CBC WITH DIFFERENTIAL/PLATELET
Basophils Absolute: 0 10*3/uL (ref 0.0–0.1)
Basophils Relative: 0.1 % (ref 0.0–3.0)
EOS PCT: 1.2 % (ref 0.0–5.0)
Eosinophils Absolute: 0.1 10*3/uL (ref 0.0–0.7)
HCT: 40.2 % (ref 39.0–52.0)
Hemoglobin: 13.6 g/dL (ref 13.0–17.0)
LYMPHS ABS: 1.8 10*3/uL (ref 0.7–4.0)
Lymphocytes Relative: 41.1 % (ref 12.0–46.0)
MCHC: 33.9 g/dL (ref 30.0–36.0)
MCV: 94.4 fl (ref 78.0–100.0)
MONO ABS: 0.5 10*3/uL (ref 0.1–1.0)
MONOS PCT: 11.3 % (ref 3.0–12.0)
NEUTROS ABS: 2 10*3/uL (ref 1.4–7.7)
NEUTROS PCT: 46.3 % (ref 43.0–77.0)
PLATELETS: 112 10*3/uL — AB (ref 150.0–400.0)
RBC: 4.26 Mil/uL (ref 4.22–5.81)
RDW: 14.5 % (ref 11.5–15.5)
WBC: 4.3 10*3/uL (ref 4.0–10.5)

## 2015-08-16 NOTE — Patient Instructions (Addendum)
Testosterone treatment has risks, including increased or decreased fertility (depending on the type of treatment), hair loss, prostate cancer, benign prostate enlargement, blood clots, liver problems, lower hdl ("good cholesterol"), polycythemia (opposite of anemia), sleep apnea, and behavior changes. blood tests are requested for you today.  We'll let you know about the results.  Based on the results, you may need to increase to 1/2 pill per day.  Try taking 5 pills (100 mg) of generic viagra, to see if this helps ED symptoms.    Please return in 1 year.

## 2015-08-16 NOTE — Progress Notes (Signed)
Subjective:    Patient ID: [name], male    DOB: June 24, 1965, 50 y.o.   MRN: [id number]  HPI Pt returns for f/u of idiopathic central hypogonadism (he has no biological children; he has never taken illicit androgens; testosterone was not low enough to need MRI, but pituitary was normal on 2007 MRI, done for unrelated reason; he took androgel from 2012-2014; he then changed to depo-testosterone, due to lower cost; he stopped this in early 2017, due to ins probs; then he started clomid).  he has been on clomid since March of 2017. Since on it, he continues to have ED sxs (even with 60 mg of viagra), and decreased libido.    Past Medical History  Diagnosis Date  . Anxiety   . Depression     bipolar [facility]  . Hypogonadism male   . ADD (attention deficit disorder)   . Microscopic hematuria     hereditary s/p Urology eval  . Bipolar 1 disorder ([place])   . Arthritis     right hip  . Family history of anesthesia complication     pt is unsure , but pt father may have been difficult to arouse   . Pneumonia 10-2012  . Blood in urine   . Hypertension   . PTSD (post-traumatic stress disorder)     SOCIAL ANXIETY DISORDER   . History of kidney stones   . Creatinine elevation   . Liver fatty degeneration   . Diabetes mellitus without complication [facility])     Past Surgical History  Procedure Laterality Date  . Lumbar disc surgery    . Back surgery    . Closed reduction metacarpal with percutaneous pinning Right   . Tonsillectomy    . Total hip arthroplasty Right 08/16/2013    Procedure: TOTAL HIP ARTHROPLASTY ANTERIOR APPROACH;  Surgeon: Hessie Dibble, MD;  Location: [place];  Service: Orthopedics;  Laterality: Right;  . Total hip arthroplasty Left 05/22/2015    Procedure: TOTAL HIP ARTHROPLASTY ANTERIOR APPROACH;  Surgeon: Melrose Nakayama, MD;  Location: [place];  Service: Orthopedics;  Laterality: Left;    Social History   Social History  . Marital Status: Single    Spouse  Name: N/A  . Number of Children: N/A  . Years of Education: N/A   Occupational History  . [employer] manager    Social History Main Topics  . Smoking status: Former Smoker -- 25 years    Types: Cigarettes    Quit date: 02/19/2014  . Smokeless tobacco: Never Used  . Alcohol Use: No  . Drug Use: No  . Sexual Activity: Not Currently   Other Topics Concern  . Not on file   Social History Narrative   regualar exercise-no    Current Outpatient Prescriptions on File Prior to Visit  Medication Sig Dispense Refill  . aspirin 325 MG EC tablet Take 1 tablet (325 mg total) by mouth 2 (two) times daily after a meal. 60 tablet 0  . atorvastatin (LIPITOR) 10 MG tablet Take 1 tablet (10 mg total) by mouth daily. 90 tablet 3  . b complex vitamins tablet Take 1 tablet by mouth daily. Reported on 07/09/2015    . buPROPion (WELLBUTRIN XL) 150 MG 24 hr tablet Take 1 tablet by mouth at bedtime.  1  . buPROPion (WELLBUTRIN XL) 300 MG 24 hr tablet Take 1 tablet by mouth daily. 300 mg in the morning and 150 mg in the evening.    . cholecalciferol (VITAMIN D)  1000 UNITS tablet Take 5,000 Units by mouth daily.     . clomiPHENE (CLOMID) 50 MG tablet 1/4 tab daily 15 tablet 2  . clonazePAM (KLONOPIN) 2 MG tablet Take 2 mg by mouth 2 (two) times daily as needed for anxiety.     . divalproex (DEPAKOTE ER) 500 MG 24 hr tablet Take 1,500 mg by mouth at bedtime.     Marland Kitchen emtricitabine-tenofovir (TRUVADA) 200-300 MG tablet Take 1 tablet by mouth daily. 90 tablet 3  . Eszopiclone (ESZOPICLONE) 3 MG TABS Take 3 mg by mouth at bedtime. Take immediately before bedtime    . Icosapent Ethyl (VASCEPA) 1 g CAPS Take 2 capsules by mouth 2 (two) times daily. 360 capsule 3  . losartan-hydrochlorothiazide (HYZAAR) 100-12.5 MG tablet Take 1 tablet by mouth daily. 90 tablet 3  . metFORMIN (GLUCOPHAGE) 1000 MG tablet Take 1 tablet (1,000 mg total) by mouth 2 (two) times daily with a meal. 180 tablet 1  . methocarbamol (ROBAXIN) 500  MG tablet Take 1 tablet (500 mg total) by mouth every 6 (six) hours as needed for muscle spasms. 50 tablet 0  . mirtazapine (REMERON) 45 MG tablet Take 1 tablet (45 mg total) by mouth at bedtime. 30 tablet 5  . Multiple Vitamin (MULTIVITAMIN WITH MINERALS) TABS Take 1 tablet by mouth every morning.    Marland Kitchen oxyCODONE-acetaminophen (PERCOCET/ROXICET) 5-325 MG tablet Take 1-2 tablets by mouth every 4 (four) hours as needed for moderate pain or severe pain. 50 tablet 0  . QUEtiapine (SEROQUEL) 400 MG tablet Take 1 tablet by mouth 2 (two) times daily.    . sildenafil (REVATIO) 20 MG tablet 1-5 pills, as needed for ED symptoms 60 tablet 5  . traMADol (ULTRAM) 50 MG tablet as needed.  0  . TRUVADA 200-300 MG tablet TAKE 1 TABLET DAILY 90 tablet 3   No current facility-administered medications on file prior to visit.    No Known Allergies  Family History  Problem Relation Age of Onset  . Diabetes Father   . Cancer Mother     died of melanoma with mets  . Cervical cancer Sister   . Diabetes Sister   . Other Neg Hx     hypogonadism    BP 142/86 mmHg  Pulse 97  Temp(Src) 97.7 F (36.5 C) (Oral)  Ht 6\' 4"  (1.93 m)  Wt 208 lb (94.348 kg)  BMI 25.33 kg/m2  SpO2 96%   Review of Systems Denies gynecomastia.      Objective:   Physical Exam VITAL SIGNS:  See vs page GENERAL: no distress. Ext: no edema.     (I checked [place] contr subst database.  rx for testosterone has not been filled).    Assessment & Plan:  Hypogonadism, due for recheck  Patient is advised the following: Patient Instructions  Testosterone treatment has risks, including increased or decreased fertility (depending on the type of treatment), hair loss, prostate cancer, benign prostate enlargement, blood clots, liver problems, lower hdl ("good cholesterol"), polycythemia (opposite of anemia), sleep apnea, and behavior changes. blood tests are requested for you today.  We'll let you know about the results.  Based on the  results, you may need to increase to 1/2 pill per day.  Try taking 5 pills (100 mg) of generic viagra, to see if this helps ED symptoms.    Please return in 1 year.

## 2015-08-17 ENCOUNTER — Encounter: Payer: Self-pay | Admitting: Endocrinology

## 2015-08-17 LAB — PROLACTIN: PROLACTIN: 5.7 ng/mL (ref 2.0–18.0)

## 2015-08-18 LAB — TESTOSTERONE,FREE AND TOTAL
TESTOSTERONE FREE: 11.2 pg/mL (ref 6.8–21.5)
Testosterone: 293 ng/dL — ABNORMAL LOW (ref 348–1197)

## 2015-09-12 ENCOUNTER — Other Ambulatory Visit: Payer: Self-pay

## 2015-09-26 ENCOUNTER — Other Ambulatory Visit (INDEPENDENT_AMBULATORY_CARE_PROVIDER_SITE_OTHER): Payer: BLUE CROSS/BLUE SHIELD

## 2015-09-26 ENCOUNTER — Encounter: Payer: Self-pay | Admitting: Internal Medicine

## 2015-09-26 ENCOUNTER — Ambulatory Visit (INDEPENDENT_AMBULATORY_CARE_PROVIDER_SITE_OTHER): Payer: BLUE CROSS/BLUE SHIELD | Admitting: Internal Medicine

## 2015-09-26 VITALS — BP 160/98 | HR 96 | Wt 207.0 lb

## 2015-09-26 DIAGNOSIS — I10 Essential (primary) hypertension: Secondary | ICD-10-CM

## 2015-09-26 DIAGNOSIS — R453 Demoralization and apathy: Secondary | ICD-10-CM

## 2015-09-26 DIAGNOSIS — E1165 Type 2 diabetes mellitus with hyperglycemia: Secondary | ICD-10-CM

## 2015-09-26 DIAGNOSIS — F3175 Bipolar disorder, in partial remission, most recent episode depressed: Secondary | ICD-10-CM

## 2015-09-26 DIAGNOSIS — E119 Type 2 diabetes mellitus without complications: Secondary | ICD-10-CM

## 2015-09-26 DIAGNOSIS — N183 Chronic kidney disease, stage 3 unspecified: Secondary | ICD-10-CM

## 2015-09-26 DIAGNOSIS — F319 Bipolar disorder, unspecified: Secondary | ICD-10-CM

## 2015-09-26 DIAGNOSIS — Z7721 Contact with and (suspected) exposure to potentially hazardous body fluids: Secondary | ICD-10-CM

## 2015-09-26 DIAGNOSIS — E291 Testicular hypofunction: Secondary | ICD-10-CM

## 2015-09-26 DIAGNOSIS — E1122 Type 2 diabetes mellitus with diabetic chronic kidney disease: Secondary | ICD-10-CM

## 2015-09-26 DIAGNOSIS — M5440 Lumbago with sciatica, unspecified side: Secondary | ICD-10-CM

## 2015-09-26 DIAGNOSIS — IMO0002 Reserved for concepts with insufficient information to code with codable children: Secondary | ICD-10-CM

## 2015-09-26 DIAGNOSIS — R634 Abnormal weight loss: Secondary | ICD-10-CM

## 2015-09-26 LAB — BASIC METABOLIC PANEL
BUN: 27 mg/dL — AB (ref 6–23)
CHLORIDE: 108 meq/L (ref 96–112)
CO2: 28 meq/L (ref 19–32)
Calcium: 9.1 mg/dL (ref 8.4–10.5)
Creatinine, Ser: 2.87 mg/dL — ABNORMAL HIGH (ref 0.40–1.50)
GFR: 25.13 mL/min — ABNORMAL LOW (ref >60.00)
GLUCOSE: 128 mg/dL — AB (ref 70–99)
Potassium: 3.5 mEq/L (ref 3.5–5.1)
Sodium: 144 mEq/L (ref 135–145)

## 2015-09-26 MED ORDER — METFORMIN HCL 1000 MG PO TABS: 500.0000 mg | ORAL_TABLET | Freq: Two times a day (BID) | ORAL | Status: DC

## 2015-09-26 NOTE — Assessment & Plan Note (Signed)
Chronic - Dr Adelene Idler Back on Depakote Wellbutrin, Remeron

## 2015-09-26 NOTE — Progress Notes (Signed)
Subjective:  Patient ID: [name], male    DOB: 1965/12/05  Age: 50 y.o. MRN: [id number]  CC: No chief complaint on file.   HPI [name] presents for DM, Truvada Rx, CRI, OA/LBP f/u  Outpatient Prescriptions Prior to Visit  Medication Sig Dispense Refill  . aspirin 325 MG EC tablet Take 1 tablet (325 mg total) by mouth 2 (two) times daily after a meal. 60 tablet 0  . atorvastatin (LIPITOR) 10 MG tablet Take 1 tablet (10 mg total) by mouth daily. 90 tablet 3  . b complex vitamins tablet Take 1 tablet by mouth daily. Reported on 07/09/2015    . buPROPion (WELLBUTRIN XL) 150 MG 24 hr tablet Take 1 tablet by mouth at bedtime.  1  . buPROPion (WELLBUTRIN XL) 300 MG 24 hr tablet Take 1 tablet by mouth daily. 300 mg in the morning and 150 mg in the evening.    . cholecalciferol (VITAMIN D) 1000 UNITS tablet Take 5,000 Units by mouth daily.     . clonazePAM (KLONOPIN) 2 MG tablet Take 2 mg by mouth 2 (two) times daily as needed for anxiety.     . divalproex (DEPAKOTE ER) 500 MG 24 hr tablet Take 1,500 mg by mouth at bedtime.     Marland Kitchen emtricitabine-tenofovir (TRUVADA) 200-300 MG tablet Take 1 tablet by mouth daily. 90 tablet 3  . Eszopiclone (ESZOPICLONE) 3 MG TABS Take 3 mg by mouth at bedtime. Take immediately before bedtime    . Icosapent Ethyl (VASCEPA) 1 g CAPS Take 2 capsules by mouth 2 (two) times daily. 360 capsule 3  . losartan-hydrochlorothiazide (HYZAAR) 100-12.5 MG tablet Take 1 tablet by mouth daily. 90 tablet 3  . metFORMIN (GLUCOPHAGE) 1000 MG tablet Take 1 tablet (1,000 mg total) by mouth 2 (two) times daily with a meal. 180 tablet 1  . methocarbamol (ROBAXIN) 500 MG tablet Take 1 tablet (500 mg total) by mouth every 6 (six) hours as needed for muscle spasms. 50 tablet 0  . mirtazapine (REMERON) 45 MG tablet Take 1 tablet (45 mg total) by mouth at bedtime. 30 tablet 5  . Multiple Vitamin (MULTIVITAMIN WITH MINERALS) TABS Take 1 tablet by mouth every morning.    Marland Kitchen  oxyCODONE-acetaminophen (PERCOCET/ROXICET) 5-325 MG tablet Take 1-2 tablets by mouth every 4 (four) hours as needed for moderate pain or severe pain. 50 tablet 0  . QUEtiapine (SEROQUEL) 400 MG tablet Take 1 tablet by mouth 2 (two) times daily.    . sildenafil (REVATIO) 20 MG tablet 1-5 pills, as needed for ED symptoms 60 tablet 5  . traMADol (ULTRAM) 50 MG tablet as needed.  0  . TRUVADA 200-300 MG tablet TAKE 1 TABLET DAILY 90 tablet 3  . clomiPHENE (CLOMID) 50 MG tablet 1/4 tab daily (Patient not taking: Reported on 09/26/2015) 15 tablet 2   No facility-administered medications prior to visit.    ROS Review of Systems  Constitutional: Positive for fatigue. Negative for appetite change and unexpected weight change.  HENT: Negative for congestion, nosebleeds, sneezing, sore throat and trouble swallowing.   Eyes: Negative for itching and visual disturbance.  Respiratory: Negative for cough.   Cardiovascular: Negative for chest pain, palpitations and leg swelling.  Gastrointestinal: Negative for nausea, diarrhea, blood in stool and abdominal distention.  Genitourinary: Negative for frequency and hematuria.  Musculoskeletal: Positive for back pain, arthralgias and gait problem. Negative for joint swelling and neck pain.  Skin: Negative for rash.  Neurological: Negative for dizziness, tremors,  speech difficulty and weakness.  Psychiatric/Behavioral: Positive for sleep disturbance, dysphoric mood and decreased concentration. Negative for suicidal ideas and agitation. The patient is nervous/anxious.     Objective:  BP 160/98 mmHg  Pulse 96  Wt 207 lb (93.895 kg)  SpO2 97%  BP Readings from Last 3 Encounters:  09/26/15 160/98  08/16/15 142/86  07/16/15 100/70    Wt Readings from Last 3 Encounters:  09/26/15 207 lb (93.895 kg)  08/16/15 208 lb (94.348 kg)  07/16/15 214 lb (97.07 kg)    Physical Exam  Constitutional: He is oriented to person, place, and time. He appears  well-developed. No distress.  NAD  HENT:  Mouth/Throat: Oropharynx is clear and moist.  Eyes: Conjunctivae are normal. Pupils are equal, round, and reactive to light.  Neck: Normal range of motion. No JVD present. No thyromegaly present.  Cardiovascular: Normal rate, regular rhythm, normal heart sounds and intact distal pulses.  Exam reveals no gallop and no friction rub.   No murmur heard. Pulmonary/Chest: Effort normal and breath sounds normal. No respiratory distress. He has no wheezes. He has no rales. He exhibits no tenderness.  Abdominal: Soft. Bowel sounds are normal. He exhibits no distension and no mass. There is no tenderness. There is no rebound and no guarding.  Musculoskeletal: Normal range of motion. He exhibits tenderness. He exhibits no edema.  Lymphadenopathy:    He has no cervical adenopathy.  Neurological: He is alert and oriented to person, place, and time. He has normal reflexes. No cranial nerve deficit. He exhibits normal muscle tone. He displays a negative Romberg sign. Coordination abnormal. Gait normal.  Skin: Skin is warm and dry. No rash noted.  Psychiatric: He has a normal mood and affect. His behavior is normal. Judgment and thought content normal.   A complex case  Lab Results  Component Value Date   WBC 4.3 08/16/2015   HGB 13.6 08/16/2015   HCT 40.2 08/16/2015   PLT 112.0* 08/16/2015   GLUCOSE 121* 07/16/2015   CHOL 224* 04/18/2015   TRIG * 04/18/2015    729.0 Triglyceride is over 400; calculations on Lipids are invalid.   HDL 30.10* 04/18/2015   LDLDIRECT 102.0 04/18/2015   LDLCALC 84 12/11/2006   ALT 46 04/18/2015   AST 43* 04/18/2015   NA 136 07/16/2015   K 4.2 07/16/2015   CL 101 07/16/2015   CREATININE 2.81* 07/16/2015   BUN 49* 07/16/2015   CO2 23 07/16/2015   TSH 1.55 04/18/2015   PSA 2.46 04/18/2015   INR 1.13 05/11/2015   HGBA1C 5.5 07/16/2015   MICROALBUR 15.8* 04/18/2015    Dg Chest 2 View  05/11/2015  CLINICAL DATA:   50 year old male under preoperative evaluation for hip replacement on 05/22/2015. EXAM: CHEST  2 VIEW COMPARISON:  Chest x-ray 08/09/2013. FINDINGS: Chronic blunting of the left costophrenic sulcus, presumably related to a post infectious or inflammatory scarring. Linear opacity in the lingula increased compared to the prior examination, likely part of this scarring. Right lung is clear. No pleural effusions. No evidence of pulmonary edema. No pneumothorax. No suspicious appearing pulmonary nodules or masses. Heart size is normal. Upper mediastinal contours are within normal limits. IMPRESSION: 1. Increasing pleuroparenchymal scarring in the lingula and left costophrenic sulcus, as above. 2. No radiographic evidence of acute cardiopulmonary disease. Electronically Signed   By: Vinnie Langton M.D.   On: 05/11/2015 19:43    Assessment & Plan:   There are no diagnoses linked to this encounter. I am having  [name] maintain his cholecalciferol, multivitamin with minerals, clonazePAM, mirtazapine, divalproex, buPROPion, QUEtiapine, emtricitabine-tenofovir, Icosapent Ethyl, b complex vitamins, Eszopiclone, losartan-hydrochlorothiazide, aspirin, methocarbamol, oxyCODONE-acetaminophen, atorvastatin, TRUVADA, metFORMIN, sildenafil, clomiPHENE, buPROPion, and traMADol.  No orders of the defined types were placed in this encounter.     Follow-up: No Follow-up on file.  [name], MD

## 2015-09-26 NOTE — Assessment & Plan Note (Signed)
Suboptimal control Losartan-HCT

## 2015-09-26 NOTE — Assessment & Plan Note (Signed)
6/17 we will have to d/c Truvada due to GFR<30

## 2015-09-26 NOTE — Assessment & Plan Note (Signed)
Chronic. 

## 2015-09-26 NOTE — Progress Notes (Signed)
Pre visit review using our clinic review tool, if applicable. No additional management support is needed unless otherwise documented below in the visit note. 

## 2015-09-26 NOTE — Assessment & Plan Note (Signed)
The pt stopped Clomiphene 6/17

## 2015-09-26 NOTE — Assessment & Plan Note (Signed)
Wt Readings from Last 3 Encounters:  09/26/15 207 lb (93.895 kg)  08/16/15 208 lb (94.348 kg)  07/16/15 214 lb (97.07 kg)

## 2015-09-26 NOTE — Assessment & Plan Note (Addendum)
2016 - worse Metformin - will reduce the dose

## 2015-09-27 LAB — HIV ANTIBODY (ROUTINE TESTING W REFLEX): HIV: NONREACTIVE

## 2015-10-02 ENCOUNTER — Emergency Department (HOSPITAL_COMMUNITY): Payer: BLUE CROSS/BLUE SHIELD

## 2015-10-02 ENCOUNTER — Observation Stay (HOSPITAL_COMMUNITY)
Admission: EM | Admit: 2015-10-02 | Discharge: 2015-10-05 | Disposition: A | Discharge status: Home / Self Care | Payer: BLUE CROSS/BLUE SHIELD | Attending: Internal Medicine | Admitting: Internal Medicine

## 2015-10-02 ENCOUNTER — Encounter (HOSPITAL_COMMUNITY): Payer: Self-pay | Admitting: Emergency Medicine

## 2015-10-02 DIAGNOSIS — I129 Hypertensive chronic kidney disease with stage 1 through stage 4 chronic kidney disease, or unspecified chronic kidney disease: Secondary | ICD-10-CM | POA: Insufficient documentation

## 2015-10-02 DIAGNOSIS — E291 Testicular hypofunction: Secondary | ICD-10-CM | POA: Insufficient documentation

## 2015-10-02 DIAGNOSIS — N189 Chronic kidney disease, unspecified: Secondary | ICD-10-CM | POA: Diagnosis present

## 2015-10-02 DIAGNOSIS — N529 Male erectile dysfunction, unspecified: Secondary | ICD-10-CM | POA: Diagnosis not present

## 2015-10-02 DIAGNOSIS — E1122 Type 2 diabetes mellitus with diabetic chronic kidney disease: Secondary | ICD-10-CM | POA: Diagnosis not present

## 2015-10-02 DIAGNOSIS — F4310 Post-traumatic stress disorder, unspecified: Secondary | ICD-10-CM | POA: Insufficient documentation

## 2015-10-02 DIAGNOSIS — Z79899 Other long term (current) drug therapy: Secondary | ICD-10-CM | POA: Diagnosis not present

## 2015-10-02 DIAGNOSIS — R627 Adult failure to thrive: Secondary | ICD-10-CM | POA: Diagnosis not present

## 2015-10-02 DIAGNOSIS — F250 Schizoaffective disorder, bipolar type: Secondary | ICD-10-CM | POA: Diagnosis present

## 2015-10-02 DIAGNOSIS — E876 Hypokalemia: Secondary | ICD-10-CM | POA: Diagnosis not present

## 2015-10-02 DIAGNOSIS — F411 Generalized anxiety disorder: Secondary | ICD-10-CM | POA: Diagnosis not present

## 2015-10-02 DIAGNOSIS — N179 Acute kidney failure, unspecified: Secondary | ICD-10-CM | POA: Diagnosis present

## 2015-10-02 DIAGNOSIS — R531 Weakness: Secondary | ICD-10-CM

## 2015-10-02 DIAGNOSIS — E785 Hyperlipidemia, unspecified: Secondary | ICD-10-CM | POA: Insufficient documentation

## 2015-10-02 DIAGNOSIS — E1165 Type 2 diabetes mellitus with hyperglycemia: Secondary | ICD-10-CM | POA: Insufficient documentation

## 2015-10-02 DIAGNOSIS — R29898 Other symptoms and signs involving the musculoskeletal system: Secondary | ICD-10-CM

## 2015-10-02 DIAGNOSIS — M4806 Spinal stenosis, lumbar region: Secondary | ICD-10-CM | POA: Diagnosis not present

## 2015-10-02 DIAGNOSIS — Z87442 Personal history of urinary calculi: Secondary | ICD-10-CM | POA: Diagnosis not present

## 2015-10-02 DIAGNOSIS — N183 Chronic kidney disease, stage 3 unspecified: Secondary | ICD-10-CM

## 2015-10-02 DIAGNOSIS — Z96643 Presence of artificial hip joint, bilateral: Secondary | ICD-10-CM | POA: Insufficient documentation

## 2015-10-02 DIAGNOSIS — Z79890 Hormone replacement therapy (postmenopausal): Secondary | ICD-10-CM | POA: Diagnosis not present

## 2015-10-02 DIAGNOSIS — I10 Essential (primary) hypertension: Secondary | ICD-10-CM

## 2015-10-02 DIAGNOSIS — M6289 Other specified disorders of muscle: Secondary | ICD-10-CM | POA: Diagnosis present

## 2015-10-02 DIAGNOSIS — R4781 Slurred speech: Principal | ICD-10-CM | POA: Insufficient documentation

## 2015-10-02 DIAGNOSIS — F329 Major depressive disorder, single episode, unspecified: Secondary | ICD-10-CM | POA: Insufficient documentation

## 2015-10-02 DIAGNOSIS — Z9181 History of falling: Secondary | ICD-10-CM | POA: Diagnosis not present

## 2015-10-02 DIAGNOSIS — F319 Bipolar disorder, unspecified: Secondary | ICD-10-CM | POA: Insufficient documentation

## 2015-10-02 DIAGNOSIS — F32A Depression, unspecified: Secondary | ICD-10-CM

## 2015-10-02 HISTORY — DX: Chronic kidney disease, stage 3 (moderate): N18.3

## 2015-10-02 HISTORY — DX: Chronic kidney disease, stage 3 unspecified: N18.30

## 2015-10-02 LAB — COMPREHENSIVE METABOLIC PANEL
ALBUMIN: 3.6 g/dL (ref 3.5–5.0)
ALK PHOS: 52 U/L (ref 38–126)
ALT: 35 U/L (ref 17–63)
AST: 45 U/L — AB (ref 15–41)
Anion gap: 9 (ref 5–15)
BUN: 23 mg/dL — ABNORMAL HIGH (ref 6–20)
CHLORIDE: 104 mmol/L (ref 101–111)
CO2: 27 mmol/L (ref 22–32)
Calcium: 9.1 mg/dL (ref 8.9–10.3)
Creatinine, Ser: 3.21 mg/dL — ABNORMAL HIGH (ref 0.61–1.24)
GFR calc non Af Amer: 21 mL/min — ABNORMAL LOW (ref >60)
GFR, EST AFRICAN AMERICAN: 25 mL/min — AB (ref >60)
Glucose, Bld: 131 mg/dL — ABNORMAL HIGH (ref 65–99)
POTASSIUM: 3.3 mmol/L — AB (ref 3.5–5.1)
SODIUM: 140 mmol/L (ref 135–145)
TOTAL PROTEIN: 6.5 g/dL (ref 6.5–8.1)
Total Bilirubin: 1 mg/dL (ref 0.3–1.2)

## 2015-10-02 LAB — DIFFERENTIAL
BASOS ABS: 0 10*3/uL (ref 0.0–0.1)
BASOS PCT: 0 %
Eosinophils Absolute: 0 10*3/uL (ref 0.0–0.7)
Eosinophils Relative: 0 %
LYMPHS PCT: 18 %
Lymphs Abs: 1.2 10*3/uL (ref 0.7–4.0)
Monocytes Absolute: 0.8 10*3/uL (ref 0.1–1.0)
Monocytes Relative: 12 %
NEUTROS ABS: 4.4 10*3/uL (ref 1.7–7.7)
NEUTROS PCT: 69 %

## 2015-10-02 LAB — CBC
HCT: 35.6 % — ABNORMAL LOW (ref 39.0–52.0)
Hemoglobin: 11.9 g/dL — ABNORMAL LOW (ref 13.0–17.0)
MCH: 32.2 pg (ref 26.0–34.0)
MCHC: 33.4 g/dL (ref 30.0–36.0)
MCV: 96.5 fL (ref 78.0–100.0)
PLATELETS: 98 10*3/uL — AB (ref 150–400)
RBC: 3.69 MIL/uL — AB (ref 4.22–5.81)
RDW: 16.8 % — AB (ref 11.5–15.5)
WBC: 6.4 10*3/uL (ref 4.0–10.5)

## 2015-10-02 LAB — I-STAT TROPONIN, ED: TROPONIN I, POC: 0 ng/mL (ref 0.00–0.08)

## 2015-10-02 LAB — APTT: APTT: 27 s (ref 24–37)

## 2015-10-02 LAB — PROTIME-INR
INR: 1.27 (ref 0.00–1.49)
PROTHROMBIN TIME: 16 s — AB (ref 11.6–15.2)

## 2015-10-02 NOTE — ED Notes (Signed)
LKW yesterday per sister. S/S noted at 1600 today

## 2015-10-02 NOTE — ED Provider Notes (Signed)
CSN: [id number]     Arrival date & time 10/02/15  1901 History  By signing my name below, I, Dyke Brackett, attest that this documentation has been prepared under the direction and in the presence of Orpah Greek, MD . Electronically Signed: Dyke Brackett, Scribe. 10/02/2015. 11:29 PM.     Chief Complaint  Patient presents with  . Aphasia  . Facial Droop  . Extremity Weakness   The history is provided by the patient and a relative. No language interpreter was used.  HPI Comments:  [name] is a 50 y.o. male who has a PMHx of blood clot on the brain several years ago presents to the Emergency Department sudden onset, gradual improving, left sided weakness onset yesterday. He notes associated left sided facial droop and moderate slurred speech. Pt notes he has been having difficulty ambulating due to bilateral leg weakness. He notes falling this afternoon and states he began to "jerk". He believes that he may have had a seizure and notes he has a hx of one seizure as a child. Pt denies bladder incontinence and tongue injury.    Past Medical History  Diagnosis Date  . Anxiety   . Depression     bipolar [facility]  . Hypogonadism male   . ADD (attention deficit disorder)   . Microscopic hematuria     hereditary s/p Urology eval  . Bipolar 1 disorder ([place])   . Arthritis     right hip  . Family history of anesthesia complication     pt is unsure , but pt father may have been difficult to arouse   . Pneumonia 10-2012  . Blood in urine   . Hypertension   . PTSD (post-traumatic stress disorder)     SOCIAL ANXIETY DISORDER   . History of kidney stones   . Creatinine elevation   . Liver fatty degeneration   . Diabetes mellitus without complication [facility])    Past Surgical History  Procedure Laterality Date  . Lumbar disc surgery    . Back surgery    . Closed reduction metacarpal with percutaneous pinning Right   . Tonsillectomy    . Total hip arthroplasty Right  08/16/2013    Procedure: TOTAL HIP ARTHROPLASTY ANTERIOR APPROACH;  Surgeon: Hessie Dibble, MD;  Location: [facility];  Service: Orthopedics;  Laterality: Right;  . Total hip arthroplasty Left 05/22/2015    Procedure: TOTAL HIP ARTHROPLASTY ANTERIOR APPROACH;  Surgeon: Melrose Nakayama, MD;  Location: [facility];  Service: Orthopedics;  Laterality: Left;   Family History  Problem Relation Age of Onset  . Diabetes Father   . Cancer Mother     died of melanoma with mets  . Cervical cancer Sister   . Diabetes Sister   . Other Neg Hx     hypogonadism   Social History  Substance Use Topics  . Smoking status: Former Smoker -- 0 years    Types: Cigarettes    Quit date: 02/19/2014  . Smokeless tobacco: Never Used  . Alcohol Use: No    Review of Systems  Genitourinary:       -Urinary incontinence  Skin: Negative for wound.  Neurological: Positive for facial asymmetry and weakness.  All other systems reviewed and are negative.     Allergies  Review of patient's allergies indicates no known allergies.  Home Medications   Prior to Admission medications   Medication Sig Start Date End Date Taking? Authorizing Provider  atorvastatin (LIPITOR) 10 MG tablet Take  1 tablet (10 mg total) by mouth daily. 05/29/15   Evie Lacks Plotnikov, MD  b complex vitamins tablet Take 1 tablet by mouth daily. Reported on 07/09/2015    Historical Provider, MD  buPROPion (WELLBUTRIN XL) 150 MG 24 hr tablet Take 1 tablet by mouth at bedtime. 07/04/15   Historical Provider, MD  buPROPion (WELLBUTRIN XL) 300 MG 24 hr tablet Take 1 tablet by mouth daily. 300 mg in the morning and 150 mg in the evening. 04/03/15   Historical Provider, MD  cholecalciferol (VITAMIN D) 1000 UNITS tablet Take 5,000 Units by mouth daily.     Historical Provider, MD  clomiPHENE (CLOMID) 50 MG tablet 1/4 tab daily Patient not taking: Reported on 09/26/2015 07/13/15   Renato Shin, MD  clonazePAM (KLONOPIN) 2 MG tablet Take 2 mg by mouth 2 (two) times  daily as needed for anxiety.  08/16/14   Historical Provider, MD  divalproex (DEPAKOTE ER) 500 MG 24 hr tablet Take 1,500 mg by mouth at bedtime.  01/25/15   Historical Provider, MD  emtricitabine-tenofovir (TRUVADA) 200-300 MG tablet Take 1 tablet by mouth daily. 04/18/15   Evie Lacks Plotnikov, MD  Eszopiclone (ESZOPICLONE) 3 MG TABS Take 3 mg by mouth at bedtime. Take immediately before bedtime    Historical Provider, MD  Icosapent Ethyl (VASCEPA) 1 g CAPS Take 2 capsules by mouth 2 (two) times daily. 04/20/15   Evie Lacks Plotnikov, MD  losartan-hydrochlorothiazide (HYZAAR) 100-12.5 MG tablet Take 1 tablet by mouth daily. 05/11/15   [name], MD  metFORMIN (GLUCOPHAGE) 1000 MG tablet Take 0.5 tablets (500 mg total) by mouth 2 (two) times daily with a meal. 09/26/15   [name], MD  methocarbamol (ROBAXIN) 500 MG tablet Take 1 tablet (500 mg total) by mouth every 6 (six) hours as needed for muscle spasms. 05/24/15   Loni Dolly, PA-C  mirtazapine (REMERON) 45 MG tablet Take 1 tablet (45 mg total) by mouth at bedtime. 09/04/14   [name], MD  Multiple Vitamin (MULTIVITAMIN WITH MINERALS) TABS Take 1 tablet by mouth every morning.    Historical Provider, MD  QUEtiapine (SEROQUEL) 400 MG tablet Take 1 tablet by mouth 2 (two) times daily. 04/04/15   Historical Provider, MD  sildenafil (REVATIO) 20 MG tablet 1-5 pills, as needed for ED symptoms 07/09/15   Renato Shin, MD  traMADol (ULTRAM) 50 MG tablet as needed. 07/10/15   Historical Provider, MD   BP 152/100 mmHg  Pulse 83  Temp(Src) 97.7 F (36.5 C) (Oral)  Resp 18  SpO2 98% Physical Exam  Constitutional: He is oriented to person, place, and time. He appears well-developed and well-nourished. No distress.  HENT:  Head: Normocephalic and atraumatic.  Right Ear: Hearing normal.  Left Ear: Hearing normal.  Nose: Nose normal.  Mouth/Throat: Oropharynx is clear and moist and mucous membranes are normal.  Eyes:  Conjunctivae and EOM are normal. Pupils are equal, round, and reactive to light.  Neck: Normal range of motion. Neck supple.  Cardiovascular: Regular rhythm, S1 normal and S2 normal.  Exam reveals no gallop and no friction rub.   No murmur heard. Pulmonary/Chest: Effort normal and breath sounds normal. No respiratory distress. He exhibits no tenderness.  Abdominal: Soft. Normal appearance and bowel sounds are normal. There is no hepatosplenomegaly. There is no tenderness. There is no rebound, no guarding, no tenderness at McBurney's point and negative Murphy's sign. No hernia.  Musculoskeletal: Normal range of motion.  Neurological: He is alert and oriented  to person, place, and time. He has normal strength. No cranial nerve deficit or sensory deficit. Coordination normal. GCS eye subscore is 4. GCS verbal subscore is 5. GCS motor subscore is 6.  Skin: Skin is warm, dry and intact. No rash noted. No cyanosis.  Psychiatric: He has a normal mood and affect. His speech is normal and behavior is normal. Thought content normal.  Nursing note and vitals reviewed.   ED Course  Procedures  DIAGNOSTIC STUDIES:  Oxygen Saturation is 98% on RA, normal by my interpretation.    COORDINATION OF CARE:  11:20 PM Discussed treatment plan with pt at bedside and pt agreed to plan.  Labs Review Labs Reviewed  PROTIME-INR - Abnormal; Notable for the following:    Prothrombin Time 16.0 (*)    All other components within normal limits  CBC - Abnormal; Notable for the following:    RBC 3.69 (*)    Hemoglobin 11.9 (*)    HCT 35.6 (*)    RDW 16.8 (*)    Platelets 98 (*)    All other components within normal limits  COMPREHENSIVE METABOLIC PANEL - Abnormal; Notable for the following:    Potassium 3.3 (*)    Glucose, Bld 131 (*)    BUN 23 (*)    Creatinine, Ser 3.21 (*)    AST 45 (*)    GFR calc non Af Amer 21 (*)    GFR calc Af Amer 25 (*)    All other components within normal limits  APTT   DIFFERENTIAL  I-STAT TROPOININ, ED  I-STAT CHEM 8, ED    Imaging Review Ct Head Wo Contrast  10/02/2015  CLINICAL DATA:  Slurred speech, right facial droop, difficulty with balance. Hypertension and diabetes. EXAM: CT HEAD WITHOUT CONTRAST TECHNIQUE: Contiguous axial images were obtained from the base of the skull through the vertex without intravenous contrast. COMPARISON:  None. FINDINGS: Brain: Generalized brain atrophy, moderate in degree for age, with commensurate dilatation of the ventricles and sulci. All areas of the brain demonstrate normal gray-white matter differentiation. There is no mass, hemorrhage, edema or other evidence of acute parenchymal abnormality. No extra-axial hemorrhage. Vascular: No hyperdense vessel or unexpected calcification. Skull: Negative for fracture or focal lesion. Sinuses/Orbits: No acute findings. Visualized upper paranasal sinuses are clear. Other: None. IMPRESSION: 1. No acute intracranial abnormality. No intracranial mass, hemorrhage or edema. 2. Atrophy. Electronically Signed   By: Franki Cabot M.D.   On: 10/02/2015 20:49   I have personally reviewed and evaluated these images and lab results as part of my medical decision-making.   EKG Interpretation None      MDM   Final diagnoses:  None  TIA  Patient presented with complaints of slurred speech, right facial droop. Bilateral lower extremity weakness. Symptoms were more pronounced earlier today, have improved slowly over the course of the evening. Time of onset is unknown, patient was alone all day and then at 4 PM sister came home and noticed the change. Patient reports shaking spell, thinks he might have had a seizure. Has a distant history of seizures in the past, no current treatment. Initial stroke workup is negative. Seen by neurology, will admit for MRI, EEG, monitoring.  I personally performed the services described in this documentation, which was scribed in my presence. The recorded  information has been reviewed and is accurate.    Orpah Greek, MD 10/03/15 0110

## 2015-10-02 NOTE — ED Notes (Signed)
Pt.'s sister noticed slurred speech , mild right facial droop and bilateral lower leg weakness onset yesterday , alert and oriented at arrival , speech clear / no facial asymmetry , equal weak grips with no arm drift. Respirations unlabored.

## 2015-10-03 ENCOUNTER — Encounter (HOSPITAL_COMMUNITY): Payer: Self-pay | Admitting: Internal Medicine

## 2015-10-03 ENCOUNTER — Observation Stay (HOSPITAL_BASED_OUTPATIENT_CLINIC_OR_DEPARTMENT_OTHER)
Admit: 2015-10-03 | Discharge: 2015-10-03 | Disposition: A | Discharge status: Home / Self Care | Payer: BLUE CROSS/BLUE SHIELD | Attending: Neurology | Admitting: Neurology

## 2015-10-03 ENCOUNTER — Emergency Department (HOSPITAL_COMMUNITY): Payer: BLUE CROSS/BLUE SHIELD

## 2015-10-03 ENCOUNTER — Observation Stay (HOSPITAL_COMMUNITY): Payer: BLUE CROSS/BLUE SHIELD

## 2015-10-03 DIAGNOSIS — E876 Hypokalemia: Secondary | ICD-10-CM | POA: Diagnosis present

## 2015-10-03 DIAGNOSIS — R29898 Other symptoms and signs involving the musculoskeletal system: Secondary | ICD-10-CM | POA: Diagnosis present

## 2015-10-03 DIAGNOSIS — R569 Unspecified convulsions: Secondary | ICD-10-CM | POA: Diagnosis not present

## 2015-10-03 DIAGNOSIS — E785 Hyperlipidemia, unspecified: Secondary | ICD-10-CM

## 2015-10-03 DIAGNOSIS — N179 Acute kidney failure, unspecified: Secondary | ICD-10-CM

## 2015-10-03 DIAGNOSIS — R4781 Slurred speech: Secondary | ICD-10-CM

## 2015-10-03 DIAGNOSIS — I10 Essential (primary) hypertension: Secondary | ICD-10-CM

## 2015-10-03 DIAGNOSIS — N189 Chronic kidney disease, unspecified: Secondary | ICD-10-CM | POA: Diagnosis present

## 2015-10-03 DIAGNOSIS — G8190 Hemiplegia, unspecified affecting unspecified side: Secondary | ICD-10-CM

## 2015-10-03 DIAGNOSIS — R29810 Facial weakness: Secondary | ICD-10-CM

## 2015-10-03 DIAGNOSIS — F411 Generalized anxiety disorder: Secondary | ICD-10-CM

## 2015-10-03 DIAGNOSIS — N183 Chronic kidney disease, stage 3 (moderate): Secondary | ICD-10-CM

## 2015-10-03 LAB — LIPID PANEL
CHOLESTEROL: 90 mg/dL (ref 0–200)
HDL: 21 mg/dL — ABNORMAL LOW (ref >40)
LDL Cholesterol: 28 mg/dL (ref 0–99)
Total CHOL/HDL Ratio: 4.3 RATIO
Triglycerides: 205 mg/dL — ABNORMAL HIGH (ref <150)
VLDL: 41 mg/dL — ABNORMAL HIGH (ref 0–40)

## 2015-10-03 LAB — URINALYSIS, ROUTINE W REFLEX MICROSCOPIC
BILIRUBIN URINE: NEGATIVE
Glucose, UA: 500 mg/dL — AB
KETONES UR: NEGATIVE mg/dL
LEUKOCYTES UA: NEGATIVE
NITRITE: NEGATIVE
PH: 7.5 (ref 5.0–8.0)
PROTEIN: 30 mg/dL — AB
Specific Gravity, Urine: 1.012 (ref 1.005–1.030)

## 2015-10-03 LAB — GLUCOSE, CAPILLARY
GLUCOSE-CAPILLARY: 83 mg/dL (ref 65–99)
GLUCOSE-CAPILLARY: 216 mg/dL — AB (ref 65–99)
Glucose-Capillary: 123 mg/dL — ABNORMAL HIGH (ref 65–99)
Glucose-Capillary: 174 mg/dL — ABNORMAL HIGH (ref 65–99)

## 2015-10-03 LAB — MAGNESIUM: MAGNESIUM: 1.9 mg/dL (ref 1.7–2.4)

## 2015-10-03 LAB — URINE MICROSCOPIC-ADD ON
BACTERIA UA: NONE SEEN
WBC, UA: NONE SEEN WBC/hpf (ref 0–5)

## 2015-10-03 LAB — VALPROIC ACID LEVEL: VALPROIC ACID LVL: 49 ug/mL — AB (ref 50.0–100.0)

## 2015-10-03 LAB — CREATININE, URINE, RANDOM: CREATININE, URINE: 35.87 mg/dL

## 2015-10-03 MED ORDER — SODIUM CHLORIDE 0.9 % IV SOLN
INTRAVENOUS | Status: DC
  Administered 2015-10-03: 04:00:00 via INTRAVENOUS

## 2015-10-03 MED ORDER — TRAMADOL HCL 50 MG PO TABS
50.0000 mg | ORAL_TABLET | Freq: Four times a day (QID) | ORAL | Status: DC | PRN
  Administered 2015-10-03: 50 mg via ORAL
  Filled 2015-10-03: qty 1

## 2015-10-03 MED ORDER — MIRTAZAPINE 15 MG PO TABS
45.0000 mg | ORAL_TABLET | Freq: Every day | ORAL | Status: DC
  Administered 2015-10-03 – 2015-10-04 (×2): 45 mg via ORAL
  Filled 2015-10-03 (×2): qty 3

## 2015-10-03 MED ORDER — STROKE: EARLY STAGES OF RECOVERY BOOK
Freq: Once | Status: AC
  Administered 2015-10-03: 04:00:00
  Filled 2015-10-03: qty 1

## 2015-10-03 MED ORDER — B COMPLEX-C PO TABS
1.0000 | ORAL_TABLET | Freq: Every day | ORAL | Status: DC
  Administered 2015-10-03 – 2015-10-05 (×3): 1 via ORAL
  Filled 2015-10-03 (×3): qty 1

## 2015-10-03 MED ORDER — GLUCERNA SHAKE PO LIQD
237.0000 mL | ORAL | Status: DC
  Administered 2015-10-03: 237 mL via ORAL
  Filled 2015-10-03 (×3): qty 237

## 2015-10-03 MED ORDER — OMEGA-3-ACID ETHYL ESTERS 1 G PO CAPS
2.0000 g | ORAL_CAPSULE | Freq: Two times a day (BID) | ORAL | Status: DC
  Administered 2015-10-03 – 2015-10-05 (×5): 2 g via ORAL
  Filled 2015-10-03 (×5): qty 2

## 2015-10-03 MED ORDER — CLONAZEPAM 0.5 MG PO TABS
2.0000 mg | ORAL_TABLET | Freq: Two times a day (BID) | ORAL | Status: DC | PRN
  Administered 2015-10-05: 2 mg via ORAL
  Filled 2015-10-03: qty 4

## 2015-10-03 MED ORDER — BUPROPION HCL ER (XL) 150 MG PO TB24: 150.0000 mg | ORAL_TABLET | Freq: Every day | ORAL | Status: DC

## 2015-10-03 MED ORDER — SENNOSIDES-DOCUSATE SODIUM 8.6-50 MG PO TABS: 1.0000 | ORAL_TABLET | Freq: Every evening | ORAL | Status: DC | PRN

## 2015-10-03 MED ORDER — POTASSIUM CHLORIDE 20 MEQ/15ML (10%) PO SOLN
20.0000 meq | Freq: Once | ORAL | Status: AC
  Administered 2015-10-03: 20 meq via ORAL
  Filled 2015-10-03: qty 15

## 2015-10-03 MED ORDER — VITAMIN D 1000 UNITS PO TABS
5000.0000 [IU] | ORAL_TABLET | Freq: Every day | ORAL | Status: DC
  Administered 2015-10-03 – 2015-10-05 (×3): 5000 [IU] via ORAL
  Filled 2015-10-03 (×3): qty 5

## 2015-10-03 MED ORDER — INSULIN ASPART 100 UNIT/ML ~~LOC~~ SOLN
0.0000 [IU] | Freq: Three times a day (TID) | SUBCUTANEOUS | Status: DC
  Administered 2015-10-03: 2 [IU] via SUBCUTANEOUS
  Administered 2015-10-04: 1 [IU] via SUBCUTANEOUS
  Administered 2015-10-04: 2 [IU] via SUBCUTANEOUS
  Administered 2015-10-05: 1 [IU] via SUBCUTANEOUS

## 2015-10-03 MED ORDER — ASPIRIN 325 MG PO TABS: 325.0000 mg | ORAL_TABLET | Freq: Every day | ORAL | Status: DC

## 2015-10-03 MED ORDER — POLYETHYLENE GLYCOL 3350 17 G PO PACK
17.0000 g | PACK | Freq: Every day | ORAL | Status: DC
  Administered 2015-10-03 – 2015-10-05 (×3): 17 g via ORAL
  Filled 2015-10-03 (×3): qty 1

## 2015-10-03 MED ORDER — B COMPLEX PO TABS: 1.0000 | ORAL_TABLET | Freq: Every day | ORAL | Status: DC

## 2015-10-03 MED ORDER — AMLODIPINE BESYLATE 5 MG PO TABS
5.0000 mg | ORAL_TABLET | Freq: Every day | ORAL | Status: DC
  Administered 2015-10-03 – 2015-10-05 (×3): 5 mg via ORAL
  Filled 2015-10-03 (×3): qty 1

## 2015-10-03 MED ORDER — BUPROPION HCL ER (XL) 150 MG PO TB24
300.0000 mg | ORAL_TABLET | Freq: Every day | ORAL | Status: DC
  Administered 2015-10-03 – 2015-10-05 (×3): 300 mg via ORAL
  Filled 2015-10-03 (×3): qty 2

## 2015-10-03 MED ORDER — DIVALPROEX SODIUM ER 500 MG PO TB24
1500.0000 mg | ORAL_TABLET | Freq: Every day | ORAL | Status: DC
  Administered 2015-10-03 – 2015-10-04 (×2): 1500 mg via ORAL
  Filled 2015-10-03 (×2): qty 3

## 2015-10-03 MED ORDER — METHOCARBAMOL 500 MG PO TABS: 500.0000 mg | ORAL_TABLET | Freq: Four times a day (QID) | ORAL | Status: DC | PRN

## 2015-10-03 MED ORDER — QUETIAPINE FUMARATE 200 MG PO TABS
400.0000 mg | ORAL_TABLET | Freq: Two times a day (BID) | ORAL | Status: DC
  Administered 2015-10-03 – 2015-10-05 (×5): 400 mg via ORAL
  Filled 2015-10-03 (×5): qty 2

## 2015-10-03 MED ORDER — ATORVASTATIN CALCIUM 10 MG PO TABS
10.0000 mg | ORAL_TABLET | Freq: Every day | ORAL | Status: DC
  Administered 2015-10-03 – 2015-10-05 (×3): 10 mg via ORAL
  Filled 2015-10-03 (×3): qty 1

## 2015-10-03 MED ORDER — GLUCERNA SHAKE PO LIQD
237.0000 mL | Freq: Three times a day (TID) | ORAL | Status: DC | PRN
  Filled 2015-10-03: qty 237

## 2015-10-03 MED ORDER — ICOSAPENT ETHYL 1 G PO CAPS: 2.0000 | ORAL_CAPSULE | Freq: Two times a day (BID) | ORAL | Status: DC

## 2015-10-03 MED ORDER — EMTRICITABINE-TENOFOVIR AF 200-25 MG PO TABS
1.0000 | ORAL_TABLET | Freq: Every day | ORAL | Status: DC
  Administered 2015-10-03 – 2015-10-05 (×3): 1 via ORAL
  Filled 2015-10-03 (×3): qty 1

## 2015-10-03 MED ORDER — INSULIN ASPART 100 UNIT/ML ~~LOC~~ SOLN
0.0000 [IU] | Freq: Every day | SUBCUTANEOUS | Status: DC
  Administered 2015-10-03 – 2015-10-04 (×2): 2 [IU] via SUBCUTANEOUS

## 2015-10-03 MED ORDER — HYDRALAZINE HCL 20 MG/ML IJ SOLN: 5.0000 mg | INTRAMUSCULAR | Status: DC | PRN

## 2015-10-03 MED ORDER — SENNOSIDES-DOCUSATE SODIUM 8.6-50 MG PO TABS
2.0000 | ORAL_TABLET | Freq: Two times a day (BID) | ORAL | Status: DC
  Administered 2015-10-03 – 2015-10-05 (×5): 2 via ORAL
  Filled 2015-10-03 (×5): qty 2

## 2015-10-03 MED ORDER — ASPIRIN 300 MG RE SUPP: 300.0000 mg | Freq: Every day | RECTAL | Status: DC

## 2015-10-03 MED ORDER — ZOLPIDEM TARTRATE 5 MG PO TABS
5.0000 mg | ORAL_TABLET | Freq: Every evening | ORAL | Status: DC | PRN
  Administered 2015-10-03 – 2015-10-04 (×2): 5 mg via ORAL
  Filled 2015-10-03 (×2): qty 1

## 2015-10-03 MED ORDER — ADULT MULTIVITAMIN W/MINERALS CH
1.0000 | ORAL_TABLET | Freq: Every morning | ORAL | Status: DC
  Administered 2015-10-03 – 2015-10-05 (×3): 1 via ORAL
  Filled 2015-10-03 (×3): qty 1

## 2015-10-03 NOTE — Progress Notes (Signed)
Patient arrived to 5M06 AAOx4 and pleasant. Vitals taken and tele placed. Call bell at his side. Will continue to monitor. Daleisa Halperin, Rande Brunt, RN

## 2015-10-03 NOTE — Consult Note (Addendum)
Neurology Consultation Reason for Consult: Right-sided weakness  Referring Physician: Dr. Betsey Holiday   HPI: [name] is a 50 y.o. male with history of central hypogonadism, diabetes mellitus, bipolar disorder presented with left-sided weakness which started yesterday. Which is gradually improved History was provided by the patient and ER physician. Patient reports that the he's been having intermittent left-sided weakness and slurring of speech since last 3 months usually associated with extreme anxiety. He also reports that he has a history of seizures and he was having shaking spells during this event Family reported that he had an episode yesterday where he was slurring her speech and was walking like he was drunk on his baseline he uses a cane because of hip surgery and low back pain. CT scan of the head shows generalized cerebral atrophy without any acute findings MRI brain was also reviewed which was done in 2007 which showed an area of hyperintensity in the left temporal region of unknown etiology On my examination he has right-sided giveaway weakness not left Currently denies any chest pain palpitations and headache or change in vision, denies fever or chills no change in bowel or urinary habits.  Past Medical History  Diagnosis Date  . Anxiety   . Depression     bipolar [facility]  . Hypogonadism male   . ADD (attention deficit disorder)   . Microscopic hematuria     hereditary s/p Urology eval  . Bipolar 1 disorder ([place])   . Arthritis     right hip  . Family history of anesthesia complication     pt is unsure , but pt father may have been difficult to arouse   . Pneumonia 10-2012  . Blood in urine   . Hypertension   . PTSD (post-traumatic stress disorder)     SOCIAL ANXIETY DISORDER   . History of kidney stones   . Creatinine elevation   . Liver fatty degeneration   . Diabetes mellitus without complication [facility])    Past Surgical History  Procedure Laterality  Date  . Lumbar disc surgery    . Back surgery    . Closed reduction metacarpal with percutaneous pinning Right   . Tonsillectomy    . Total hip arthroplasty Right 08/16/2013    Procedure: TOTAL HIP ARTHROPLASTY ANTERIOR APPROACH; Surgeon: Hessie Dibble, MD; Location: [place]; Service: Orthopedics; Laterality: Right;  . Total hip arthroplasty Left 05/22/2015    Procedure: TOTAL HIP ARTHROPLASTY ANTERIOR APPROACH; Surgeon: Melrose Nakayama, MD; Location: [place]; Service: Orthopedics; Laterality: Left;   Outpatient Prescriptions Prior to Visit  Medication Sig Dispense Refill  .      Marland Kitchen atorvastatin (LIPITOR) 10 MG tablet Take 1 tablet (10 mg total) by mouth daily. 90 tablet 3  . b complex vitamins tablet Take 1 tablet by mouth daily. Reported on 07/09/2015    . buPROPion (WELLBUTRIN XL) 150 MG 24 hr tablet Take 1 tablet by mouth at bedtime.  1  . buPROPion (WELLBUTRIN XL) 300 MG 24 hr tablet Take 1 tablet by mouth daily. 300 mg in the morning and 150 mg in the evening.    . cholecalciferol (VITAMIN D) 1000 UNITS tablet Take 5,000 Units by mouth daily.     . clonazePAM (KLONOPIN) 2 MG tablet Take 2 mg by mouth 2 (two) times daily as needed for anxiety.     . divalproex (DEPAKOTE ER) 500 MG 24 hr tablet Take 1,500 mg by mouth at bedtime.     Marland Kitchen emtricitabine-tenofovir (TRUVADA) 200-300  MG tablet Take 1 tablet by mouth daily. 90 tablet 3  . Eszopiclone (ESZOPICLONE) 3 MG TABS Take 3 mg by mouth at bedtime. Take immediately before bedtime    . Icosapent Ethyl (VASCEPA) 1 g CAPS Take 2 capsules by mouth 2 (two) times daily. 360 capsule 3  . losartan-hydrochlorothiazide (HYZAAR) 100-12.5 MG tablet Take 1 tablet by mouth daily. 90 tablet 3  . metFORMIN (GLUCOPHAGE) 1000 MG tablet Take 1 tablet (1,000 mg total) by mouth 2 (two) times daily with a meal. 180 tablet 1  . methocarbamol (ROBAXIN) 500 MG  tablet Take 1 tablet (500 mg total) by mouth every 6 (six) hours as needed for muscle spasms. 50 tablet 0  . mirtazapine (REMERON) 45 MG tablet Take 1 tablet (45 mg total) by mouth at bedtime. 30 tablet 5  . Multiple Vitamin (MULTIVITAMIN WITH MINERALS) TABS Take 1 tablet by mouth every morning.    Marland Kitchen oxyCODONE-acetaminophen (PERCOCET/ROXICET) 5-325 MG tablet Take 1-2 tablets by mouth every 4 (four) hours as needed for moderate pain or severe pain. 50 tablet 0  . QUEtiapine (SEROQUEL) 400 MG tablet Take 1 tablet by mouth 2 (two) times daily.    . sildenafil (REVATIO) 20 MG tablet 1-5 pills, as needed for ED symptoms 60 tablet 5  . traMADol (ULTRAM) 50 MG tablet as needed.  0  . TRUVADA 200-300 MG tablet TAKE 1 TABLET DAILY 90 tablet 3  . clomiPHENE (CLOMID) 50 MG tablet 1/4 tab daily (Patient not taking: Reported on 09/26/2015)            Family History  Problem Relation Age of Onset  . Diabetes Father   . Cancer Mother     died of melanoma with mets  . Cervical cancer Sister   . Diabetes Sister   . Other Neg Hx     hypogonadism   Social History  Substance Use Topics  . Smoking status: Former Smoker -- 0 years    Types: Cigarettes    Quit date: 02/19/2014  . Smokeless tobacco: Never Used  . Alcohol Use: No         Exam: Current vital signs: BP 161/99 mmHg  Pulse 71  Temp(Src) 97.7 F (36.5 C) (Oral)  Resp 15  SpO2 100% Vital signs in last 24 hours: Temp:  [97.7 F (36.5 C)-98.8 F (37.1 C)] 97.7 F (36.5 C) (06/13 2234) Pulse Rate:  [71-90] 71 (06/13 2345) Resp:  [14-18] 15 (06/13 2345) BP: (137-161)/(99-101) 161/99 mmHg (06/13 2300) SpO2:  [98 %-100 %] 100 % (06/13 2345)  Physical Exam  Constitutional: Appears well-developed and well-nourished.  Psych: Affect appropriate to situation Eyes: No scleral injection HENT: No OP obstrucion Head: Normocephalic.  Cardiovascular:  Normal rate and regular rhythm.  Respiratory: Effort normal and breath sounds normal to anterior ascultation GI: Soft.  No distension. There is no tenderness.  Skin: WDI  Neuro: Mental Status: Patient is awake, alert, oriented to person, place, month, year, and situation. Patient is able to give a clear and coherent history. No signs of aphasia or neglect Cranial Nerves: II: Visual Fields are full. Pupils are equal, round, and reactive to light.   III,IV, VI: EOMI without ptosis or diploplia.  V: Facial sensation is symmetric to temperature VII: Facial movement is symmetric.  VIII: hearing is intact to voice X: Uvula elevates symmetrically XI: Shoulder shrug is symmetric. XII: tongue is midline without atrophy or fasciculations.  Motor: Tone is normal. Bulk is normal. Left upper extremity 5/5, right upper extremity  giveaway weakness Bilateral lower extremity 4/5, deep tendon reflexes are +2 in upper extremities and +1 in lower extremities Sensory: Mild decrease to temperature sensation in the feet  Plantars: Toes are downgoing bilaterally.  Cerebellar: FNF  intact bilaterally  I have reviewed the images obtained: CT scan of the head was reviewed and shows a mild generalized cerebral atrophy without any acute findings  Assessment and recommendation  [name] is a 50 y.o. male with history of central hypogonadism, diabetes mellitus, bipolar disorder presented with left-sided weakness which started yesterday. Which is gradually improved. Patient also reports history of seizures although he is not on seizure medication. He also reports that he had some shaking spells yesterday and today with intact consciousness. On my examination he has right-sided giveaway weakness not left Exact etiology of patient's symptoms is unknown diagnostic consideration psychogenic or associated with anxiety or bipolar disorder. Nevertheless patient does have underlying vascular risk factors. We will  obtain brain MRI and EEG for further management   Lurena Joiner, MD 12:34 AM

## 2015-10-03 NOTE — Progress Notes (Signed)
Patient off floor in EEG will pick up q2hrs vitals when patient returns to floor

## 2015-10-03 NOTE — H&P (Addendum)
History and Physical    [name] [id number] DOB: 1966-02-26 DOA: 10/02/2015  Referring MD/NP/PA:   PCP: [name], MD   Patient coming from:  The patient is coming from home.  At baseline, pt is independent for most of ADL.      Chief Complaint:   HPI: [name] is a 50 y.o. male with medical history significant of hypogonadism, diabetes mellitus, bipolar disorder, hypertension, hyperlipidemia, diabetes mellitus, depression, anxiety, ADD, remote seizure (44 or 50 yo), CKD-III, who presents with shaking, slurred speech, bilateral leg weakness.  Pt states that he has hx of bilateral hip replacement, causing bilateral leg weakness.  Per report, he has slurring of speech in the past 3 months, which is usually associated with extreme anxiety. He also reports that he has a remote history of seizures and he was having shaking spells during this event. Per family, pt had slurred speech and poor balance when he was walking yesterday. He does not have vision change or hearing loss. Patient denies chest pain, shortness breath, abdominal pain, diarrhea, symptoms of UTI. No nausea, vomiting or diarrhea.  ED Course: pt was found to have INR 1.27, PTT 27, negative troponin, WBC 6.4, temperature normal, no tachycardia, no tachypnea, potassium is 3.3, worsening renal function, negative CT head and negative MRI of the brain for acute intracranial abnormalities. Patient is placed on telemetry bed for observation. Neurology was consulted.  Review of Systems:   General: no fevers, chills, no changes in body weight, has fatigue HEENT: no blurry vision, hearing changes or sore throat Pulm: no dyspnea, coughing, wheezing CV: no chest pain, no palpitations Abd: no nausea, vomiting, abdominal pain, diarrhea, constipation GU: no dysuria, burning on urination, increased urinary frequency, hematuria  Ext: no leg edema Neuro: has slurred speech, bilateral leg weakness and shaking, no vision change or  hearing loss Skin: no rash MSK: No muscle spasm, no deformity, no limitation of range of movement in spin Heme: No easy bruising.  Travel history: No recent long distant travel.  Allergy:  Allergies  Allergen Reactions  . Vicodin [Hydrocodone-Acetaminophen] Itching    Past Medical History  Diagnosis Date  . Anxiety   . Depression     bipolar [facility]  . Hypogonadism male   . ADD (attention deficit disorder)   . Microscopic hematuria     hereditary s/p Urology eval  . Bipolar 1 disorder ([place])   . Arthritis     right hip  . Family history of anesthesia complication     pt is unsure , but pt father may have been difficult to arouse   . Pneumonia 10-2012  . Blood in urine   . Hypertension   . PTSD (post-traumatic stress disorder)     SOCIAL ANXIETY DISORDER   . History of kidney stones   . Creatinine elevation   . Liver fatty degeneration   . Diabetes mellitus without complication ([place])   . CKD (chronic kidney disease), stage III     Past Surgical History  Procedure Laterality Date  . Lumbar disc surgery    . Back surgery    . Closed reduction metacarpal with percutaneous pinning Right   . Tonsillectomy    . Total hip arthroplasty Right 08/16/2013    Procedure: TOTAL HIP ARTHROPLASTY ANTERIOR APPROACH;  Surgeon: Hessie Dibble, MD;  Location: [place];  Service: Orthopedics;  Laterality: Right;  . Total hip arthroplasty Left 05/22/2015    Procedure: TOTAL HIP ARTHROPLASTY ANTERIOR APPROACH;  Surgeon: Collier Salina  Rhona Raider, MD;  Location: [place];  Service: Orthopedics;  Laterality: Left;    Social History:  reports that he quit smoking about 19 months ago. His smoking use included Cigarettes. He quit after 0 years of use. He has never used smokeless tobacco. He reports that he does not drink alcohol or use illicit drugs.  Family History:  Family History  Problem Relation Age of Onset  . Diabetes Father   . Cancer Mother     died of melanoma with mets  . Cervical cancer  Sister   . Diabetes Sister   . Other Neg Hx     hypogonadism     Prior to Admission medications   Medication Sig Start Date End Date Taking? Authorizing Provider  atorvastatin (LIPITOR) 10 MG tablet Take 1 tablet (10 mg total) by mouth daily. 05/29/15   Evie Lacks Plotnikov, MD  b complex vitamins tablet Take 1 tablet by mouth daily. Reported on 07/09/2015    Historical Provider, MD  buPROPion (WELLBUTRIN XL) 150 MG 24 hr tablet Take 1 tablet by mouth at bedtime. 07/04/15   Historical Provider, MD  buPROPion (WELLBUTRIN XL) 300 MG 24 hr tablet Take 1 tablet by mouth daily. 300 mg in the morning and 150 mg in the evening. 04/03/15   Historical Provider, MD  cholecalciferol (VITAMIN D) 1000 UNITS tablet Take 5,000 Units by mouth daily.     Historical Provider, MD  clomiPHENE (CLOMID) 50 MG tablet 1/4 tab daily Patient not taking: Reported on 09/26/2015 07/13/15   Renato Shin, MD  clonazePAM (KLONOPIN) 2 MG tablet Take 2 mg by mouth 2 (two) times daily as needed for anxiety.  08/16/14   Historical Provider, MD  divalproex (DEPAKOTE ER) 500 MG 24 hr tablet Take 1,500 mg by mouth at bedtime.  01/25/15   Historical Provider, MD  emtricitabine-tenofovir (TRUVADA) 200-300 MG tablet Take 1 tablet by mouth daily. 04/18/15   Evie Lacks Plotnikov, MD  Eszopiclone (ESZOPICLONE) 3 MG TABS Take 3 mg by mouth at bedtime. Take immediately before bedtime    Historical Provider, MD  Icosapent Ethyl (VASCEPA) 1 g CAPS Take 2 capsules by mouth 2 (two) times daily. 04/20/15   Evie Lacks Plotnikov, MD  losartan-hydrochlorothiazide (HYZAAR) 100-12.5 MG tablet Take 1 tablet by mouth daily. 05/11/15   [name], MD  metFORMIN (GLUCOPHAGE) 1000 MG tablet Take 0.5 tablets (500 mg total) by mouth 2 (two) times daily with a meal. 09/26/15   [name], MD  methocarbamol (ROBAXIN) 500 MG tablet Take 1 tablet (500 mg total) by mouth every 6 (six) hours as needed for muscle spasms. 05/24/15   Loni Dolly, PA-C  mirtazapine  (REMERON) 45 MG tablet Take 1 tablet (45 mg total) by mouth at bedtime. 09/04/14   [name], MD  Multiple Vitamin (MULTIVITAMIN WITH MINERALS) TABS Take 1 tablet by mouth every morning.    Historical Provider, MD  QUEtiapine (SEROQUEL) 400 MG tablet Take 1 tablet by mouth 2 (two) times daily. 04/04/15   Historical Provider, MD  sildenafil (REVATIO) 20 MG tablet 1-5 pills, as needed for ED symptoms 07/09/15   Renato Shin, MD  traMADol (ULTRAM) 50 MG tablet as needed. 07/10/15   Historical Provider, MD    Physical Exam: Filed Vitals:   10/03/15 0000 10/03/15 0100 10/03/15 0303 10/03/15 0500  BP: 153/93  150/107 134/96  Pulse: 70 71 78 71  Temp:   98.2 F (36.8 C) 98.1 F (36.7 C)  TempSrc:   Oral Oral  Resp: 11 15 16 16   Height:   6\' 5"  (1.956 m)   Weight:   97.9 kg (215 lb 13.3 oz)   SpO2: 99% 100% 100% 98%   General: Not in acute distress HEENT:       Eyes: PERRL, EOMI, no scleral icterus.       ENT: No discharge from the ears and nose, no pharynx injection, no tonsillar enlargement.        Neck: No JVD, no bruit, no mass felt. Heme: No neck lymph node enlargement. Cardiac: S1/S2, RRR, No murmurs, No gallops or rubs. Pulm: No rales, wheezing, rhonchi or rubs. Abd: Soft, nondistended, nontender, no rebound pain, no organomegaly, BS present. GU: No hematuria Ext: No pitting leg edema bilaterally. 2+DP/PT pulse bilaterally. Musculoskeletal: No joint deformities, No joint redness or warmth, no limitation of ROM in spin. Skin: No rashes.  Neuro: Alert, oriented X3, cranial nerves II-XII grossly intact, moves all extremities normally. Muscle strength 5/5 in both legs and 5/5 in upper extremities, sensation to light touch intact. Brachial reflex 2+ bilaterally. Knee reflex 1+ bilaterally. Negative Babinski's sign.  Psych: Patient is not psychotic, no suicidal or hemocidal ideation.  Labs on Admission: I have personally reviewed following labs and imaging  studies  CBC:  Recent Labs Lab 10/02/15 2000  WBC 6.4  NEUTROABS 4.4  HGB 11.9*  HCT 35.6*  MCV 96.5  PLT 98*   Basic Metabolic Panel:  Recent Labs Lab 09/26/15 1356 10/02/15 2000  NA 144 140  K 3.5 3.3*  CL 108 104  CO2 28 27  GLUCOSE 128* 131*  BUN 27* 23*  CREATININE 2.87* 3.21*  CALCIUM 9.1 9.1   GFR: Estimated Creatinine Clearance: 35.9 mL/min (by C-G formula based on Cr of 3.21). Liver Function Tests:  Recent Labs Lab 10/02/15 2000  AST 45*  ALT 35  ALKPHOS 52  BILITOT 1.0  PROT 6.5  ALBUMIN 3.6   No results for input(s): LIPASE, AMYLASE in the last 168 hours. No results for input(s): AMMONIA in the last 168 hours. Coagulation Profile:  Recent Labs Lab 10/02/15 2000  INR 1.27   Cardiac Enzymes: No results for input(s): CKTOTAL, CKMB, CKMBINDEX, TROPONINI in the last 168 hours. BNP (last 3 results) No results for input(s): PROBNP in the last 8760 hours. HbA1C: No results for input(s): HGBA1C in the last 72 hours. CBG: No results for input(s): GLUCAP in the last 168 hours. Lipid Profile: No results for input(s): CHOL, HDL, LDLCALC, TRIG, CHOLHDL, LDLDIRECT in the last 72 hours. Thyroid Function Tests: No results for input(s): TSH, T4TOTAL, FREET4, T3FREE, THYROIDAB in the last 72 hours. Anemia Panel: No results for input(s): VITAMINB12, FOLATE, FERRITIN, TIBC, IRON, RETICCTPCT in the last 72 hours. Urine analysis:    Component Value Date/Time   COLORURINE YELLOW 05/11/2015 1612   APPEARANCEUR CLEAR 05/11/2015 1612   LABSPEC 1.034* 05/11/2015 1612   PHURINE 5.5 05/11/2015 1612   GLUCOSEU >1000* 05/11/2015 1612   GLUCOSEU >=1000* 04/18/2015 1516   HGBUR SMALL* 05/11/2015 1612   BILIRUBINUR NEGATIVE 05/11/2015 1612   KETONESUR 15* 05/11/2015 1612   PROTEINUR 30* 05/11/2015 1612   UROBILINOGEN 0.2 04/18/2015 1516   NITRITE NEGATIVE 05/11/2015 1612   LEUKOCYTESUR NEGATIVE 05/11/2015 1612   Sepsis  Labs: @LABRCNTIP (procalcitonin:4,lacticidven:4) )No results found for this or any previous visit (from the past 240 hour(s)).   Radiological Exams on Admission: Ct Head Wo Contrast  10/02/2015  CLINICAL DATA:  Slurred speech, right facial droop, difficulty with balance. Hypertension and diabetes.  EXAM: CT HEAD WITHOUT CONTRAST TECHNIQUE: Contiguous axial images were obtained from the base of the skull through the vertex without intravenous contrast. COMPARISON:  None. FINDINGS: Brain: Generalized brain atrophy, moderate in degree for age, with commensurate dilatation of the ventricles and sulci. All areas of the brain demonstrate normal gray-white matter differentiation. There is no mass, hemorrhage, edema or other evidence of acute parenchymal abnormality. No extra-axial hemorrhage. Vascular: No hyperdense vessel or unexpected calcification. Skull: Negative for fracture or focal lesion. Sinuses/Orbits: No acute findings. Visualized upper paranasal sinuses are clear. Other: None. IMPRESSION: 1. No acute intracranial abnormality. No intracranial mass, hemorrhage or edema. 2. Atrophy. Electronically Signed   By: Franki Cabot M.D.   On: 10/02/2015 20:49   Mr Brain Wo Contrast  10/03/2015  CLINICAL DATA:  LEFT-sided weakness beginning yesterday. Intermittent LEFT-sided weakness and slurred speech for 3 months, associated with anxiety. History of seizures. Evaluate RIGHT body weakness. History of diabetes. EXAM: MRI HEAD WITHOUT CONTRAST TECHNIQUE: Multiplanar, multiecho pulse sequences of the brain and surrounding structures were obtained without intravenous contrast. COMPARISON:  CT HEAD October 02, 2015 an MRI head December 20, 2005 FINDINGS: INTRACRANIAL CONTENTS: No reduced diffusion to suggest acute ischemia. No susceptibility artifact to suggest hemorrhage. Mild to moderate ventriculomegaly on the basis of global parenchymal brain volume loss. No suspicious parenchymal signal, masses or mass effect. A few  scattered subcentimeter supratentorial white matter FLAIR T2 hyperintensities are nonspecific. No abnormal extra-axial fluid collections. No extra-axial masses though, contrast enhanced sequences would be more sensitive. Normal major intracranial vascular flow voids present at skull base. Dolicoectatic tortuous LEFT vertebral artery as previously seen. ORBITS: The included ocular globes and orbital contents are non-suspicious. SINUSES: Minimal RIGHT ethmoid mucosal thickening without paranasal sinus air-fluid levels. Mastoid air cells are well aerated. SKULL/SOFT TISSUES: No abnormal sellar expansion. No suspicious calvarial bone marrow signal. Craniocervical junction maintained. IMPRESSION: No acute intracranial process. Mild to moderate global brain parenchymal volume loss for age. Similarly dolicoectatic LEFT vertebral artery. Electronically Signed   By: Elon Alas M.D.   On: 10/03/2015 02:18   US Renal  10/03/2015  CLINICAL DATA:  Acute kidney injury. History of renal stones and hypertension. EXAM: RENAL / URINARY TRACT ULTRASOUND COMPLETE COMPARISON:  CT abdomen and pelvis 06/14/2013 FINDINGS: Right Kidney: Length: 11.1 cm. Echogenic structure, likely a stone demonstrated in the midpole of the right kidney. No hydronephrosis. No solid mass lesions. Left Kidney: Length: 12 cm. Echogenicity within normal limits. No mass or hydronephrosis visualized. Bladder: No wall thickening or filling defects. Bilateral urine flow jets demonstrated on color flow Doppler imaging. IMPRESSION: Nonobstructing stone in the right kidney.  No hydronephrosis. Electronically Signed   By: Lucienne Capers M.D.   On: 10/03/2015 02:39     EKG: Not done in ED, will get one.   Assessment/Plan Principal Problem:   Slurred speech Active Problems:   Bipolar disorder (HCC)   Anxiety state   Depression   Essential hypertension   Hypogonadism male   Diabetes type 2, uncontrolled ([place])   Erectile dysfunction    Dyslipidemia   Leg weakness   Acute renal failure superimposed on stage 3 chronic kidney disease (HCC)  Slurred speech, shaking spell, bilateral leg weakness: not typical for TIA/stroke, possible seizure? Ct-head and MRI-brain are negative for acute intracranial abnormalities. Neurology, Dr. Gifford Shave was consulted, recommended MRI and EEG.  -will place on telemetry bed of observation -Appreciated Dr. Collins [place] recommendation -Get EEG -Swallowing screen -PT/OT -Seizure precaution  Bipolar, ADD, PTSD: Patient  is on Depakote -Check Depakote level -Continue Depakote  Depression and anxiety: Stable, no suicidal or homicidal ideations. -Continue home medications: Wellbutrin, Remeron, Seroquel, prn Klonopin,  HTN: -Hold Hyzarr due to worsening renal function -Start amlodipine 5 mg daily -IV hydralazine when necessary  Hypogonadism male: Used to be on clomiphen, currently on testosterone weekly, last dose was 2 days ago -Continue testosterone weekly  DM-II: Last A1c 5.5 on 07/16/15, well controled. Patient is taking metformin at home -SSI  HLD: Last LDL was 84 on 12/11/06 -Continue home medications: Lipitor  AoCKD-III: Baseline Cre is 1.2-2.4, pt's Cre 3.21 and BUN 23 is on admission. Likely due to ARB and diruetics - IVF: NS 75 cc/h - Check FeUrea - US-renal - Follow up renal function by BMP - Hold hyzarr  Hypokalemia: K= 3.3 on admission. - Repleted - Check Mg level   DVT ppx: SCD Code Status: Full code Family Communication: None at bed side.  Disposition Plan:  Anticipate discharge back to previous home environment Consults called:  Neuro, dr. Gifford Shave Admission status: Obs / tele  Date of Service 10/03/2015    Ivor Costa [facility] Pager [phone]  If 7PM-7AM, please contact night-coverage [email or web address] Password [facility] 10/03/2015, 5:46 AM

## 2015-10-03 NOTE — Progress Notes (Signed)
Initial Nutrition Assessment   INTERVENTION:  Provide Glucerna Shake po once daily and PRN, each supplement provides 220 kcal and 10 grams of protein Multivitamin with minerals daily   NUTRITION DIAGNOSIS:   Unintentional weight loss related to social / environmental circumstances as evidenced by per patient/family report.   GOAL:   Patient will meet greater than or equal to 90% of their needs   MONITOR:   PO intake, Supplement acceptance, Labs, Weight trends, Skin  REASON FOR ASSESSMENT:   Malnutrition Screening Tool    ASSESSMENT:   50 y.o. male with medical history significant of hypogonadism, diabetes mellitus, bipolar disorder, hypertension, hyperlipidemia, diabetes mellitus, depression, anxiety, ADD, remote seizure (89 or 50 yo), CKD-III, who presents with shaking, slurred speech, bilateral leg weakness.  Pt reports losing from 249 lbs to 215 lbs in the past 3 months. He relates the weight loss to stress and states that he often lacks the motivation to prepare a meal he likes. He reports sometimes not eating at all 1-2 days per week. He reports having a history of an eating disorder; if someone comments on him being fat he gets down on himself. He states that he ate 2 salads for lunch and received a second tray at time of visit (~1500hr) with sandwich, cola, and brownie.  Per weight history, pt has gained 8 lbs in the past week and weight is stable from 3 months ago. Looks like he lost from 231 lbs to 213 lbs between January 2017 and March 2017 (8% weight loss in 2 months). Pt has some moderate muscle wasting per nutrition-focused physical exam.   RD emphasized the importance of consistent, adequate, healthful PO intake. Encouraged pt to eat 3 meals daily and snacking if he misses a meal. Discussed nutritional supplement options as sometime pt can drink for nutrition if he does not feel like preparing a meal or eating. RD provided "General Healthful Nutrition Therapy", "Smart  Snacking for Adults and Teens", and "5-day Sample Menus". RD to order Glucerna Shakes.  Labs: high triglycerides (down from 5 months ago), low HDL, low hemoglobin, low potassium  Diet Order:  Diet regular Room service appropriate?: Yes; Fluid consistency:: Thin  Skin:  Reviewed, no issues  Last BM:  PTA  Height:   Ht Readings from Last 1 Encounters:  10/03/15 6\' 5"  (1.956 m)    Weight:   Wt Readings from Last 1 Encounters:  10/03/15 215 lb 13.3 oz (97.9 kg)    Ideal Body Weight:  94.5 kg  BMI:  Body mass index is 25.59 kg/(m^2).  Estimated Nutritional Needs:   Kcal:  [id number]  Protein:  80-95 grams  Fluid:  2.6-2.9 L/day  EDUCATION NEEDS:   No education needs identified at this time  [place], LDN Inpatient Clinical Dietitian Pager: [phone] After Hours Pager: [phone]

## 2015-10-03 NOTE — Progress Notes (Signed)
Patient reported to nurse and nurse tech that he is now too weak in lower extremities to move self to edge of bed in order to void. MD was notified.

## 2015-10-03 NOTE — Progress Notes (Signed)
EEG completed; results pending.    

## 2015-10-03 NOTE — Procedures (Signed)
ELECTROENCEPHALOGRAM REPORT  Date of Study: 10/03/2015  Patient's Name: [name] MRN: [id number] Date of Birth: 1966/04/05  Referring Provider: Wynelle Bourgeois, MD  Indication: 50 y.o. male with medical history significant of hypogonadism, diabetes mellitus, bipolar disorder, hypertension, hyperlipidemia, diabetes mellitus, depression, anxiety, ADD, remote seizure (53 or 50 yo), CKD-III, who presents with shaking, slurred speech, bilateral leg weakness.  Medications: amLODipine (NORVASC) tablet 5 mg atorvastatin (LIPITOR) tablet 10 mg B-complex with vitamin C tablet 1 tablet buPROPion (WELLBUTRIN XL) 24 hr tablet 300 mg cholecalciferol (VITAMIN D) tablet 5,000 Units clonazePAM (KLONOPIN) tablet 2 mg divalproex (DEPAKOTE ER) 24 hr tablet 1,500 mg emtricitabine-tenofovir AF (DESCOVY) 200-25 MG per tablet 1 tablet hydrALAZINE (APRESOLINE) injection 5 mg insulin aspart (novoLOG) injection 0-5 Units insulin aspart (novoLOG) injection 0-9 Units methocarbamol (ROBAXIN) tablet 500 mg mirtazapine (REMERON) tablet 45 mg multivitamin with minerals tablet 1 tablet omega-3 acid ethyl esters (LOVAZA) capsule 2 g QUEtiapine (SEROQUEL) tablet 400 mg senna-docusate (Senokot-S) tablet 1 tablet traMADol (ULTRAM) tablet 50 mg zolpidem (AMBIEN) tablet 5 mg  Technical Summary: This is a multichannel digital EEG recording, using the international 10-20 placement system with electrodes applied with paste and impedances below 5000 ohms.    Description: The EEG background is symmetric, with a well-developed posterior dominant rhythm of 8-9 Hz, which is reactive to eye opening and closing.  Diffuse beta activity is seen, with a bilateral frontal preponderance.  No focal or generalized abnormalities are seen.  No focal or generalized epileptiform discharges are seen.  Stage II sleep is not seen.  Hyperventilation and photic stimulation were performed, and produced no abnormalities.  ECG revealed  normal cardiac rate and rhythm.  Impression: This is a normal routine EEG of the awake and drowsy states, with activating procedures.  A normal study does not rule out the possibility of a seizure disorder in this patient.  Adam R. Tomi Likens, DO

## 2015-10-03 NOTE — ED Notes (Signed)
Pt taken to MRI  

## 2015-10-03 NOTE — Progress Notes (Addendum)
Patient seen and examined, reported recent frequent falls, and low back pain, report chronic constipation, denies urinary problems, he wants regular diet, diet order changed, mri lumbar spine ordered, stool softener ordered, mri brain negative, eeg result pending, Pt pending, likely d/c home with home health on 6/15 if all work up negative, and continue outpatient follow up with psych and spine surgeon dr Vertell Limber.   Neurology consulted, appreciate input.

## 2015-10-04 DIAGNOSIS — F329 Major depressive disorder, single episode, unspecified: Secondary | ICD-10-CM | POA: Diagnosis not present

## 2015-10-04 DIAGNOSIS — E876 Hypokalemia: Secondary | ICD-10-CM

## 2015-10-04 DIAGNOSIS — E291 Testicular hypofunction: Secondary | ICD-10-CM

## 2015-10-04 DIAGNOSIS — F411 Generalized anxiety disorder: Secondary | ICD-10-CM | POA: Diagnosis not present

## 2015-10-04 DIAGNOSIS — R4781 Slurred speech: Secondary | ICD-10-CM | POA: Diagnosis not present

## 2015-10-04 DIAGNOSIS — I10 Essential (primary) hypertension: Secondary | ICD-10-CM | POA: Diagnosis not present

## 2015-10-04 DIAGNOSIS — N179 Acute kidney failure, unspecified: Secondary | ICD-10-CM | POA: Diagnosis not present

## 2015-10-04 LAB — BASIC METABOLIC PANEL
Anion gap: 10 (ref 5–15)
BUN: 20 mg/dL (ref 6–20)
CHLORIDE: 107 mmol/L (ref 101–111)
CO2: 26 mmol/L (ref 22–32)
Calcium: 9.1 mg/dL (ref 8.9–10.3)
Creatinine, Ser: 2.53 mg/dL — ABNORMAL HIGH (ref 0.61–1.24)
GFR calc Af Amer: 33 mL/min — ABNORMAL LOW (ref >60)
GFR, EST NON AFRICAN AMERICAN: 29 mL/min — AB (ref >60)
GLUCOSE: 128 mg/dL — AB (ref 65–99)
POTASSIUM: 2.9 mmol/L — AB (ref 3.5–5.1)
Sodium: 143 mmol/L (ref 135–145)

## 2015-10-04 LAB — GLUCOSE, CAPILLARY
GLUCOSE-CAPILLARY: 142 mg/dL — AB (ref 65–99)
GLUCOSE-CAPILLARY: 198 mg/dL — AB (ref 65–99)
Glucose-Capillary: 117 mg/dL — ABNORMAL HIGH (ref 65–99)
Glucose-Capillary: 170 mg/dL — ABNORMAL HIGH (ref 65–99)

## 2015-10-04 LAB — CBC
HCT: 35.5 % — ABNORMAL LOW (ref 39.0–52.0)
Hemoglobin: 12 g/dL — ABNORMAL LOW (ref 13.0–17.0)
MCH: 32.7 pg (ref 26.0–34.0)
MCHC: 33.8 g/dL (ref 30.0–36.0)
MCV: 96.7 fL (ref 78.0–100.0)
PLATELETS: 82 10*3/uL — AB (ref 150–400)
RBC: 3.67 MIL/uL — AB (ref 4.22–5.81)
RDW: 16.6 % — ABNORMAL HIGH (ref 11.5–15.5)
WBC: 4.7 10*3/uL (ref 4.0–10.5)

## 2015-10-04 LAB — UREA NITROGEN, URINE: Urea Nitrogen, Ur: 181 mg/dL

## 2015-10-04 LAB — HEMOGLOBIN A1C
HEMOGLOBIN A1C: 4.9 % (ref 4.8–5.6)
MEAN PLASMA GLUCOSE: 94 mg/dL

## 2015-10-04 MED ORDER — POTASSIUM CHLORIDE CRYS ER 20 MEQ PO TBCR
40.0000 meq | EXTENDED_RELEASE_TABLET | ORAL | Status: AC
  Administered 2015-10-04 (×2): 40 meq via ORAL
  Filled 2015-10-04 (×2): qty 2

## 2015-10-04 NOTE — Progress Notes (Signed)
EEG revealed no epileptiform abnormalities.   No acute abnormality seen on MRI brain.   MRI L-spine revealed improvement of previously seen small extruded disc fragment on the right at L1-2 extending Cranially, with expected impingement of the right L1 nerve root. Also noted was mild spinal stenosis and mild foraminal stenosis bilaterally L2-3, mild spinal stenosis L3-4 with mild subarticular foraminal stenosis bilaterally, and solid fusion at L4-5 without stenosis.  Please call Neurology consults team if there are further questions.   Electronically signed: Dr. Kerney Elbe

## 2015-10-04 NOTE — NC FL2 (Signed)
[place] MEDICAID FL2 LEVEL OF CARE SCREENING TOOL     IDENTIFICATION  Patient Name: [name] Birthdate: 09/21/1965 Sex: male Admission Date (Current Location): 10/02/2015  [facility] and [place] Number:  Herbalist and Address:  [facility] [place], [place] [address] [place], [place] [address]      Provider Number: [id number]  Attending Physician Name and Address:  Florencia Reasons, MD  Relative Name and Phone Number:  Roselyn Meier [phone]    Current Level of Care: Hospital Recommended Level of Care: [place] Prior Approval Number:    Date Approved/Denied:   PASRR Number: Pending   Discharge Plan: SNF    Current Diagnoses: Patient Active Problem List   Diagnosis Date Noted  . Slurred speech 10/03/2015  . Leg weakness 10/03/2015  . Acute renal failure superimposed on stage 3 chronic kidney disease ([place]) 10/03/2015  . Hypokalemia 10/03/2015  . Hemiparesis ([place])   . Seizures ([place])   . Primary osteoarthritis of left hip 05/22/2015  . Exposure to potentially hazardous body fluids 02/12/2015  . Loss of weight 09/04/2014  . Dyslipidemia 05/31/2014  . Left hip pain 02/13/2014  . Insomnia 11/11/2013  . Degenerative joint disease (DJD) of hip 08/16/2013  . Acute respiratory failure ([place]) 11/02/2012  . Pneumonia, organism unspecified 11/02/2012  . Pleural effusion 11/02/2012  . Hypoxemia 11/02/2012  . HCAP (healthcare-associated pneumonia) 10/31/2012  . Anemia 10/31/2012  . Osteoarthritis of right hip 11/28/2011  . Right hip pain 07/22/2011  . Bilateral hip pain 07/22/2011  . LBP (low back pain) 05/20/2011  . Testicular pain, right 05/20/2011  . Erectile dysfunction 02/17/2011  . Constipation - functional 02/17/2011  . Diabetes type 2, uncontrolled ([place]) 11/06/2010  . Hypogonadism male 05/20/2010  . TOBACCO USER 05/20/2010  . Demoralization and apathy 05/20/2010  . Bipolar disorder ([place]) 01/18/2010  . Depression  01/18/2010  . MICROSCOPIC HEMATURIA 01/18/2010  . SHOULDER PAIN 01/18/2010  . Anxiety state 12/05/2006  . Essential hypertension 12/05/2006    Orientation RESPIRATION BLADDER Height & Weight     Self, Time, Situation, Place  Normal Continent Weight: 215 lb 13.3 oz (97.9 kg) Height:  6\' 5"  (195.6 cm)  BEHAVIORAL SYMPTOMS/MOOD NEUROLOGICAL BOWEL NUTRITION STATUS      Continent Diet ([place] Modified)  AMBULATORY STATUS COMMUNICATION OF NEEDS Skin   Limited Assist Verbally Normal                       Personal Care Assistance Level of Assistance  Bathing, Dressing Bathing Assistance: Limited assistance   Dressing Assistance: Limited assistance     Functional Limitations Info             SPECIAL CARE FACTORS FREQUENCY  PT (By licensed PT)     PT Frequency: 5x a week              Contractures      Additional Factors Info  Allergies, Psychotropic, Insulin Sliding Scale   Allergies Info: Vicodin Hydrocodone-acetaminophen Psychotropic Info: buPROPion (WELLBUTRIN XL) 24 hr tablet 300 mg, QUEtiapine (SEROQUEL) tablet 400 mg, divalproex (DEPAKOTE ER) 24 hr tablet 1,500 mg, mirtazapine (REMERON) tablet 45 mg Insulin Sliding Scale Info: 3x a day with meals       Current Medications (10/04/2015):  This is the current hospital active medication list Current Facility-Administered Medications  Medication Dose Route Frequency Provider Last Rate Last Dose  . 0.9 %  sodium chloride infusion   Intravenous Continuous Ivor Costa,  MD 75 mL/hr at 10/03/15 0342    . amLODipine (NORVASC) tablet 5 mg  5 mg Oral Daily Ivor Costa, MD   5 mg at 10/04/15 1011  . atorvastatin (LIPITOR) tablet 10 mg  10 mg Oral q1800 Ivor Costa, MD   10 mg at 10/03/15 1820  . B-complex with vitamin C tablet 1 tablet  1 tablet Oral Daily Ivor Costa, MD   1 tablet at 10/04/15 1006  . buPROPion (WELLBUTRIN XL) 24 hr tablet 300 mg  300 mg Oral Daily Ivor Costa, MD   300 mg at 10/04/15 1006  . cholecalciferol  (VITAMIN D) tablet 5,000 Units  5,000 Units Oral Daily Ivor Costa, MD   5,000 Units at 10/04/15 1005  . clonazePAM (KLONOPIN) tablet 2 mg  2 mg Oral BID PRN Ivor Costa, MD      . divalproex (DEPAKOTE ER) 24 hr tablet 1,500 mg  1,500 mg Oral QHS Ivor Costa, MD   1,500 mg at 10/03/15 2140  . emtricitabine-tenofovir AF (DESCOVY) 200-25 MG per tablet 1 tablet  1 tablet Oral Daily Ivor Costa, MD   1 tablet at 10/04/15 1005  . feeding supplement (GLUCERNA SHAKE) (GLUCERNA SHAKE) liquid 237 mL  237 mL Oral Q24H Florencia Reasons, MD   237 mL at 10/03/15 2000  . feeding supplement (GLUCERNA SHAKE) (GLUCERNA SHAKE) liquid 237 mL  237 mL Oral TID PRN Florencia Reasons, MD      . hydrALAZINE (APRESOLINE) injection 5 mg  5 mg Intravenous Q2H PRN Ivor Costa, MD      . insulin aspart (novoLOG) injection 0-5 Units  0-5 Units Subcutaneous QHS Ivor Costa, MD   2 Units at 10/03/15 2154  . insulin aspart (novoLOG) injection 0-9 Units  0-9 Units Subcutaneous TID WC Ivor Costa, MD   1 Units at 10/04/15 1215  . methocarbamol (ROBAXIN) tablet 500 mg  500 mg Oral Q6H PRN Ivor Costa, MD      . mirtazapine (REMERON) tablet 45 mg  45 mg Oral QHS Ivor Costa, MD   45 mg at 10/03/15 2139  . multivitamin with minerals tablet 1 tablet  1 tablet Oral q morning - 10a Ivor Costa, MD   1 tablet at 10/04/15 1006  . omega-3 acid ethyl esters (LOVAZA) capsule 2 g  2 g Oral BID Ivor Costa, MD   2 g at 10/04/15 1005  . polyethylene glycol (MIRALAX / GLYCOLAX) packet 17 g  17 g Oral Daily Florencia Reasons, MD   17 g at 10/04/15 1006  . QUEtiapine (SEROQUEL) tablet 400 mg  400 mg Oral BID Ivor Costa, MD   400 mg at 10/04/15 1005  . senna-docusate (Senokot-S) tablet 1 tablet  1 tablet Oral QHS PRN Ivor Costa, MD      . senna-docusate (Senokot-S) tablet 2 tablet  2 tablet Oral BID Florencia Reasons, MD   2 tablet at 10/04/15 1006  . traMADol (ULTRAM) tablet 50 mg  50 mg Oral Q6H PRN Ivor Costa, MD   50 mg at 10/03/15 1547  . zolpidem (AMBIEN) tablet 5 mg  5 mg Oral QHS PRN Ivor Costa, MD   5 mg  at 10/03/15 2155     Discharge Medications: Please see discharge summary for a list of discharge medications.  Relevant Imaging Results:  Relevant Lab Results:   Additional Information SSN [id number]  Ross Ludwig, [place]

## 2015-10-04 NOTE — Clinical Social Work Note (Addendum)
Patient has been faxed out to SNFs awaiting bed responses.  Patient is a Level 2 [place] manual screen 30 day SNF needed note on patient's chart awaiting signature by physician.  [name]. [place], MSW, [place] 10/04/2015 5:03 PM

## 2015-10-04 NOTE — Clinical Social Work Note (Signed)
Clinical Social Work Assessment  Patient Details  Name: [name] MRN: [id number] Date of Birth: April 29, 1965  Date of referral:  10/04/15               Reason for consult:  Facility Placement                Permission sought to share information with:  Facility Sport and exercise psychologist, Family Supports Permission granted to share information::  Yes, Verbal Permission Granted  Name::     Roselyn Meier [phone]  Agency::  SNF admissions  Relationship::     Contact Information:     Housing/Transportation Living arrangements for the past 2 months:  [place] of Information:  Patient Patient Interpreter Needed:  None Criminal Activity/Legal Involvement Pertinent to Current Situation/Hospitalization:  No - Comment as needed Significant Relationships:  Siblings Lives with:  Siblings Do you feel safe going back to the place where you live?  No (Patient feels he needs some short term rehab before he can return back to his sister's house.) Need for family participation in patient care:  No (Coment)  Care giving concerns: Patient feels he needs some short term rehab before he can return back home.  Social Worker assessment / plan: Patient is a talkative and pleasant 50 year old male who lives with his sister, but is not married.  Patient expressed that he was living alone, but now lives with his sister.  Patient was working but then had to go on short term disability and long term disability.  Patient states he plans to get back to work once he is well enough, if he can find a job.  Patient states he has never been to rehab before at SNF, CSW explained to patient what to expect and what the role of CSW is.  Patient was explained the insurance approval process and what to expect at SNF for rehab discharge.  Patient was also informed that if he is ready for discharge and his insurance has not approved yet, he may have to go to a different facility that may not be his first  choice.  Patient expressed understanding of this and is anxious to get discharged from the hospital so he can begin therapy some where.  CSW also informed patient that if insurance does not approve patient for SNF, he may have to return to his sister's house with home health.  Patient expressed understanding and did not have any more questions.  CSW was given permission by patient to fax him out to different facilities.  Employment status:  Disabled (Comment on whether or not currently receiving Disability) Insurance information:  Managed Care PT Recommendations:  [place] / Referral to community resources:  [place]  Patient/Family's Response to care:  Patient in agreement to going to SNF for short term rehab.  Patient/Family's Understanding of and Emotional Response to Diagnosis, Current Treatment, and Prognosis:  Patient aware of current treatment plan and prognosis.  Emotional Assessment Appearance:  Appears stated age Attitude/Demeanor/Rapport:    Affect (typically observed):  Appropriate, Calm, Pleasant Orientation:  Oriented to  Time, Oriented to Situation, Oriented to Place, Oriented to Self Alcohol / Substance use:  Not Applicable Psych involvement (Current and /or in the community):  No (Comment)  Discharge Needs  Concerns to be addressed:  Lack of Support Readmission within the last 30 days:  No Current discharge risk:  Lack of support system Barriers to Discharge:  Ship broker, Continued Medical Work  up   Anell Barr 10/04/2015, 4:54 PM

## 2015-10-04 NOTE — Care Management Note (Signed)
Case Management Note  Patient Details  Name: [name] MRN: [id number] Date of Birth: [birth year]  Subjective/Objective:      Pt admitted with slurred speech. He is from home with family.               Action/Plan: PT rec is for SNF. CM following for discharge needs.   Expected Discharge Date:                  Expected Discharge Plan:  [place]  In-House Referral:     Discharge planning Services     Post Acute Care Choice:    Choice offered to:     DME Arranged:    DME Agency:     HH Arranged:    [place] Agency:     Status of Service:  In process, will continue to follow  Medicare Important Message Given:    Date Medicare IM Given:    Medicare IM give by:    Date Additional Medicare IM Given:    Additional Medicare Important Message give by:     If discussed at [place] of Stay Meetings, dates discussed:    Additional Comments:  Pollie Friar, RN 10/04/2015, 3:32 PM

## 2015-10-04 NOTE — Progress Notes (Signed)
PT Cancellation Note  Patient Details Name: [name] MRN: [id number] DOB: July 17, 1965   Cancelled Treatment:    Reason Eval/Treat Not Completed: Patient not medically ready Pt on bedrest. Will await increase in activity orders prior to initiation of PT evaluation.   Marguarite Arbour A Railee Bonillas 10/04/2015, 7:40 AM Wray Kearns, PT, DPT [phone]

## 2015-10-04 NOTE — Evaluation (Signed)
Physical Therapy Evaluation Patient Details Name: [name] MRN: [id number] DOB: 1965/06/06 Today's Date: 10/04/2015   History of Present Illness  Patient is a 50 y/o male with hx of left THA, anxiety, depression, ADD< bipolar, PTSD, DM presents with shaking, slurred speech, bilateral leg weakness. MRI L spine-small extruded disc fragment on the right at L1-2, all other tests unremarkable.  Clinical Impression  Patient presents with generalized weakness, impaired balance, poor safety awareness, tangential speech and memory deficits impacting mobility. Pt high fall risk. Pt a difficult historian. Tolerated gait training with SPC with 2 LOB requiring Min A to correct. Pt declines using RW despite being a fall risk. Discussed safety at home- using shower chair for bathing and cleaning up clutter however pt dismissing all issues, "I don't need that, I am good," and then reporting frequent falls due to clutter and pulling up on shower curtain to get out of the tub. Pt requires 24/7 supervision/assist for mobility. Would benefit from ST SNF to maximize independence and mobility and to decrease fall risk prior to return home.    Follow Up Recommendations SNF    Equipment Recommendations  None recommended by PT    Recommendations for Other Services OT consult     Precautions / Restrictions Precautions Precautions: Fall Restrictions Weight Bearing Restrictions: No      Mobility  Bed Mobility Overal bed mobility: Needs Assistance Bed Mobility: Supine to Sit;Sit to Supine     Supine to sit: Modified independent (Device/Increase time) Sit to supine: Modified independent (Device/Increase time)   General bed mobility comments: Use of rail. Increased time.  Transfers Overall transfer level: Needs assistance Equipment used: Straight cane Transfers: Sit to/from Stand Sit to Stand: Min assist;Min guard         General transfer comment: Stood from EOB x1, from tub bench x1 with cues  for technique. Difficulty standing from low surfaces.  Ambulation/Gait Ambulation/Gait assistance: Min assist Ambulation Distance (Feet): 125 Feet Assistive device: Straight cane Gait Pattern/deviations: Step-to pattern;Step-through pattern;Scissoring;Staggering left;Staggering right;Drifts right/left;Decreased dorsiflexion - right;Decreased dorsiflexion - left Gait velocity: decreased   General Gait Details: Decreased foot clearance bilaterally with scissoring gait pattern and stumbling esp with head turns. 2 LOB. SOB.  Stairs Stairs: Yes Stairs assistance: Min assist Stair Management: Step to pattern;One rail Right;With cane Number of Stairs: 2 General stair comments: Cues for technique and safety. Min A for descending steps.  Wheelchair Mobility    Modified Rankin (Stroke Patients Only)       Balance Overall balance assessment: Needs assistance Sitting-balance support: Feet supported;No upper extremity supported Sitting balance-Leahy Scale: Good     Standing balance support: During functional activity;Single extremity supported Standing balance-Leahy Scale: Fair Standing balance comment: Requires UE support for balance and external support for dynamic standing.                             Pertinent Vitals/Pain Pain Assessment: Faces Faces Pain Scale: Hurts little more Pain Location: chronic back pain Pain Descriptors / Indicators: Sore Pain Intervention(s): Monitored during session    Home Living Family/patient expects to be discharged to:: Private residence Living Arrangements: Other relatives Available Help at Discharge: Family Type of Home: House Home Access: Stairs to enter Entrance Stairs-Rails: None Entrance Stairs-Number of Steps: 2 Home Layout: Two level Home Equipment: Cane - single point;Walker - 2 wheels;Bedside commode Additional Comments: Lives with sister and her family.    Prior Function Level of Independence: Independent  with  assistive device(s)         Comments: USes SPC for ambulation. Reports multiple falls.     Hand Dominance   Dominant Hand: Right    Extremity/Trunk Assessment   Upper Extremity Assessment: Defer to OT evaluation           Lower Extremity Assessment: Generalized weakness         Communication      Cognition Arousal/Alertness: Awake/alert Behavior During Therapy: WFL for tasks assessed/performed Overall Cognitive Status: Impaired/Different from baseline Area of Impairment: Safety/judgement;Problem solving;Memory;Attention;Following commands   Current Attention Level: Sustained (pre sustained) Memory: Decreased short-term memory Following Commands: Follows one step commands with increased time Safety/Judgement: Decreased awareness of safety;Decreased awareness of deficits   Problem Solving: Slow processing;Requires verbal cues General Comments: tangential speech and difficulty completing a thought or story. Easily distracted.     General Comments      Exercises        Assessment/Plan    PT Assessment Patient needs continued PT services  PT Diagnosis Generalized weakness;Difficulty walking   PT Problem List Decreased strength;Pain;Cardiopulmonary status limiting activity;Decreased activity tolerance;Decreased balance;Decreased mobility;Decreased cognition;Decreased safety awareness  PT Treatment Interventions Balance training;Gait training;Therapeutic activities;Therapeutic exercise;Patient/family education;Functional mobility training;Stair training;DME instruction   PT Goals (Current goals can be found in the Care Plan section) Acute Rehab PT Goals Patient Stated Goal: to go somewhere to be pushed PT Goal Formulation: With patient Time For Goal Achievement: 10/18/15 Potential to Achieve Goals: Good    Frequency Min 3X/week   Barriers to discharge Decreased caregiver support home alone most of the day    Co-evaluation               End of  Session Equipment Utilized During Treatment: Gait belt Activity Tolerance: Patient tolerated treatment well Patient left: in bed;with call bell/phone within reach;with bed alarm set Nurse Communication: Mobility status    Functional Assessment Tool Used: clinical judgment Functional Limitation: Mobility: Walking and moving around Mobility: Walking and Moving Around Current Status [id number]): At least 20 percent but less than 40 percent impaired, limited or restricted Mobility: Walking and Moving Around Goal Status [phone]): At least 1 percent but less than 20 percent impaired, limited or restricted    Time: 1330-1403 PT Time Calculation (min) (ACUTE ONLY): 33 min   Charges:   PT Evaluation $PT Eval Moderate Complexity: 1 Procedure PT Treatments $Gait Training: 8-22 mins   PT G Codes:   PT G-Codes **NOT FOR INPATIENT CLASS** Functional Assessment Tool Used: clinical judgment Functional Limitation: Mobility: Walking and moving around Mobility: Walking and Moving Around Current Status [id number]): At least 20 percent but less than 40 percent impaired, limited or restricted Mobility: Walking and Moving Around Goal Status [phone]): At least 1 percent but less than 20 percent impaired, limited or restricted    [place] 10/04/2015, 2:14 PM Wray Kearns, [place], DPT [phone]

## 2015-10-04 NOTE — Clinical Social Work Placement (Signed)
   CLINICAL SOCIAL WORK PLACEMENT  NOTE  Date:  10/04/2015  Patient Details  Name: [name] MRN: [id number] Date of Birth: 1965-10-28  Clinical Social Work is seeking post-discharge placement for this patient at the [place] level of care (*CSW will initial, date and re-position this form in  chart as items are completed):  Yes   Patient/family provided with [place] Work Department's list of facilities offering this level of care within the geographic area requested by the patient (or if unable, by the patient's family).  Yes   Patient/family informed of their freedom to choose among providers that offer the needed level of care, that participate in Medicare, Medicaid or managed care program needed by the patient, have an available bed and are willing to accept the patient.  Yes   Patient/family informed of [place]'s ownership interest in [facility] and [facility], as well as of the fact that they are under no obligation to receive care at these facilities.  PASRR submitted to EDS on 10/04/15     PASRR number received on       Existing PASRR number confirmed on       FL2 transmitted to all facilities in geographic area requested by pt/family on 10/04/15     FL2 transmitted to all facilities within larger geographic area on       Patient informed that his/her managed care company has contracts with or will negotiate with certain facilities, including the following:            Patient/family informed of bed offers received.  Patient chooses bed at       Physician recommends and patient chooses bed at      Patient to be transferred to   on  .  Patient to be transferred to facility by       Patient family notified on   of transfer.  Name of family member notified:        PHYSICIAN Please sign FL2     Additional Comment:    _______________________________________________ Ross Ludwig, LCSWA 10/04/2015, 5:01  PM

## 2015-10-04 NOTE — Evaluation (Signed)
Speech Language Pathology Evaluation Patient Details Name: [name] MRN: [id number] DOB: 06-13-1965 Today's Date: 10/04/2015 Time: [id number] SLP Time Calculation (min) (ACUTE ONLY): 20 min  Problem List:  Patient Active Problem List   Diagnosis Date Noted  . Slurred speech 10/03/2015  . Leg weakness 10/03/2015  . Acute renal failure superimposed on stage 3 chronic kidney disease ([place]) 10/03/2015  . Hypokalemia 10/03/2015  . Hemiparesis ([place])   . Seizures ([place])   . Primary osteoarthritis of left hip 05/22/2015  . Exposure to potentially hazardous body fluids 02/12/2015  . Loss of weight 09/04/2014  . Dyslipidemia 05/31/2014  . Left hip pain 02/13/2014  . Insomnia 11/11/2013  . Degenerative joint disease (DJD) of hip 08/16/2013  . Acute respiratory failure ([place]) 11/02/2012  . Pneumonia, organism unspecified 11/02/2012  . Pleural effusion 11/02/2012  . Hypoxemia 11/02/2012  . HCAP (healthcare-associated pneumonia) 10/31/2012  . Anemia 10/31/2012  . Osteoarthritis of right hip 11/28/2011  . Right hip pain 07/22/2011  . Bilateral hip pain 07/22/2011  . LBP (low back pain) 05/20/2011  . Testicular pain, right 05/20/2011  . Erectile dysfunction 02/17/2011  . Constipation - functional 02/17/2011  . Diabetes type 2, uncontrolled ([place]) 11/06/2010  . Hypogonadism male 05/20/2010  . TOBACCO USER 05/20/2010  . Demoralization and apathy 05/20/2010  . Bipolar disorder ([place]) 01/18/2010  . Depression 01/18/2010  . MICROSCOPIC HEMATURIA 01/18/2010  . SHOULDER PAIN 01/18/2010  . Anxiety state 12/05/2006  . Essential hypertension 12/05/2006   Past Medical History:  Past Medical History  Diagnosis Date  . Anxiety   . Depression     bipolar [facility]  . Hypogonadism male   . ADD (attention deficit disorder)   . Microscopic hematuria     hereditary s/p Urology eval  . Bipolar 1 disorder ([place])   . Arthritis     right hip  . Family history of anesthesia complication      pt is unsure , but pt father may have been difficult to arouse   . Pneumonia 10-2012  . Blood in urine   . Hypertension   . PTSD (post-traumatic stress disorder)     SOCIAL ANXIETY DISORDER   . History of kidney stones   . Creatinine elevation   . Liver fatty degeneration   . Diabetes mellitus without complication ([place])   . CKD (chronic kidney disease), stage III    Past Surgical History:  Past Surgical History  Procedure Laterality Date  . Lumbar disc surgery    . Back surgery    . Closed reduction metacarpal with percutaneous pinning Right   . Tonsillectomy    . Total hip arthroplasty Right 08/16/2013    Procedure: TOTAL HIP ARTHROPLASTY ANTERIOR APPROACH;  Surgeon: Hessie Dibble, MD;  Location: [facility];  Service: Orthopedics;  Laterality: Right;  . Total hip arthroplasty Left 05/22/2015    Procedure: TOTAL HIP ARTHROPLASTY ANTERIOR APPROACH;  Surgeon: Melrose Nakayama, MD;  Location: [facility];  Service: Orthopedics;  Laterality: Left;   HPI:  50 y.o. male with medical history significant of hypogonadism, diabetes mellitus, pna 10/2012, bipolar disorder, hypertension, hyperlipidemia, diabetes mellitus, depression, anxiety, ADD, remote seizure (50 or 50 yo), CKD-III, who presents with shaking, slurred speech, bilateral leg weakness. ion, negative CT and MRI were negative for acute process. EEG normal.   Assessment / Plan / Recommendation Clinical Impression  Pt reports memory difficulties in recent months and history of significant anxiety and OCD. He scored within normal limits on the Cognistat. Pt compensates  using his calendar on his phone, makes lists with phone and had a notebook where he was taking notes. SLP educated him to continue to use primarily in situations of higher stress which he comprehended. Speech 100% intelligible. No further follow up needed.      SLP Assessment  Patient does not need any further Speech Lanaguage Pathology Services    Follow Up Recommendations  None     Frequency and Duration           SLP Evaluation Prior Functioning  Cognitive/Linguistic Baseline: Baseline deficits Baseline deficit details:  (attention and memory impairments) Type of Home: House  Lives With:  (sister) Available Help at Discharge: Family Vocation: On disability   Cognition  Overall Cognitive Status: History of cognitive impairments - at baseline Arousal/Alertness: Awake/alert Orientation Level: Oriented X4 Attention: Sustained Sustained Attention: Impaired Sustained Attention Impairment: Verbal basic Memory: Appears intact (scored WFL on Cognistat) Memory Impairment: Retrieval deficit;Decreased recall of new information Awareness: Appears intact Problem Solving: Appears intact Safety/Judgment: Appears intact    Comprehension  Auditory Comprehension Overall Auditory Comprehension: Appears within functional limits for tasks assessed Visual Recognition/Discrimination Discrimination: Not tested Reading Comprehension Reading Status: Not tested    Expression Expression Primary Mode of Expression: Verbal Verbal Expression Overall Verbal Expression: Appears within functional limits for tasks assessed Written Expression Dominant Hand: Right Written Expression: Not tested   Oral / Motor  Oral Motor/Sensory Function Overall Oral Motor/Sensory Function: Within functional limits Motor Speech Overall Motor Speech: Appears within functional limits for tasks assessed Intelligibility: Intelligible Motor Planning: Witnin functional limits   GO                    [name] 10/04/2015, 1:52 PM  Orbie Pyo Jery Hollern M.Ed [employer] [phone]

## 2015-10-04 NOTE — Progress Notes (Signed)
PROGRESS NOTE  [name] [id number] DOB: November 05, 1965 DOA: 10/02/2015 PCP: [name], MD  HPI/Recap of past 24 hours:  Feeling better,   Assessment/Plan: Principal Problem:   Slurred speech Active Problems:   Bipolar disorder ([place])   Anxiety state   Depression   Essential hypertension   Hypogonadism male   Diabetes type 2, uncontrolled ([place])   Erectile dysfunction   Dyslipidemia   Leg weakness   Acute renal failure superimposed on stage 3 chronic kidney disease (HCC)   Hypokalemia   Slurred speech, shaking spell, bilateral leg weakness: not typical for TIA/stroke,Ct-head and MRI-brain are negative for acute intracranial abnormalities. EEG unremarkable, lumbar spine mri showed improvement compare to prior imaging in 2014 -PT/OT/speech/SNF -Seizure precaution -neurology input appreciated  Bipolar, ADD, PTSD: Patient is on Depakote -Check Depakote level -Continue Depakote  Depression and anxiety: Stable, no suicidal or homicidal ideations. -Continue home medications: Wellbutrin, Remeron, Seroquel, prn Klonopin,  HTN: -Hold Hyzarr due to worsening renal function -Start amlodipine 5 mg daily -IV hydralazine when necessary  Hypogonadism male: Used to be on clomiphen, currently on testosterone weekly, last dose was 2 days ago -Continue testosterone weekly  noninsulin dependent DM-II: Last A1c 5.5 on 07/16/15, well controled. Home med metformin held since admission -SSI  HLD: Last LDL was 84 on 12/11/06 -Continue home medications: Lipitor  AKI: AoCKD-III:  Baseline Cre is 1.2-2.4, pt's Cr 3.21 and BUN 23 is on admission. Likely due to ARB and diruetics,  -ua on infection -- US-renal, non obstructing stone on the right side -  Hold hyzarr,  Continue IVF: NS 75 cc/h cr improving  Hypokalemia:  - continue to Replete - Check Mg level  FTT: h/o hip and back surgery, followed by ortho and neurosurgery, continue pt/ot/snf placement    DVT ppx: SCD Code  Status: Full code Family Communication: None at bed side.  Disposition Plan: pending pt eval Consults called: Neurology   Procedures:  EEG  Antibiotics:  none   Objective: BP 142/77 mmHg  Pulse 76  Temp(Src) 97.6 F (36.4 C) (Oral)  Resp 16  Ht 6\' 5"  (1.956 m)  Wt 97.9 kg (215 lb 13.3 oz)  BMI 25.59 kg/m2  SpO2 96%  Intake/Output Summary (Last 24 hours) at 10/04/15 1119 Last data filed at 10/04/15 0914  Gross per 24 hour  Intake    480 ml  Output   3600 ml  Net  -3120 ml   Filed Weights   10/03/15 0303  Weight: 97.9 kg (215 lb 13.3 oz)    Exam:   General:  NAD  Cardiovascular: RRR  Respiratory: CTABL  Abdomen: Soft/ND/NT, positive BS  Musculoskeletal: No Edema  Neuro: aaox3  Data Reviewed: Basic Metabolic Panel:  Recent Labs Lab 10/02/15 2000 10/03/15 0434 10/04/15 0611  NA 140  --  143  K 3.3*  --  2.9*  CL 104  --  107  CO2 27  --  26  GLUCOSE 131*  --  128*  BUN 23*  --  20  CREATININE 3.21*  --  2.53*  CALCIUM 9.1  --  9.1  MG  --  1.9  --    Liver Function Tests:  Recent Labs Lab 10/02/15 2000  AST 45*  ALT 35  ALKPHOS 52  BILITOT 1.0  PROT 6.5  ALBUMIN 3.6   No results for input(s): LIPASE, AMYLASE in the last 168 hours. No results for input(s): AMMONIA in the last 168 hours. CBC:  Recent Labs Lab 10/02/15 2000  10/04/15 0611  WBC 6.4 4.7  NEUTROABS 4.4  --   HGB 11.9* 12.0*  HCT 35.6* 35.5*  MCV 96.5 96.7  PLT 98* 82*   Cardiac Enzymes:   No results for input(s): CKTOTAL, CKMB, CKMBINDEX, TROPONINI in the last 168 hours. BNP (last 3 results) No results for input(s): BNP in the last 8760 hours.  ProBNP (last 3 results) No results for input(s): PROBNP in the last 8760 hours.  CBG:  Recent Labs Lab 10/03/15 0554 10/03/15 1202 10/03/15 1714 10/03/15 2145 10/04/15 0635  GLUCAP 83 123* 174* 216* 117*    No results found for this or any previous visit (from the past 240 hour(s)).   Studies: Mr  Lumbar Spine Wo Contrast  10/03/2015  CLINICAL DATA:  Bilateral leg weakness. EXAM: MRI LUMBAR SPINE WITHOUT CONTRAST TECHNIQUE: Multiplanar, multisequence MR imaging of the lumbar spine was performed. No intravenous contrast was administered. COMPARISON:  MRI 07/04/2014, 07/25/2013 FINDINGS: Segmentation:  Normal segmentation.  Lowest disc space L5-S1 Alignment:  Normal Vertebrae: Normal appearing bone marrow. Negative for fracture or mass lesion. Conus medullaris: Extends to the T12-L1 level and appears normal. Paraspinal and other soft tissues: Paraspinous muscles are normal and symmetric. No retroperitoneal mass or adenopathy. Disc levels: T12-L1:  Negative L1-2: Small extruded disc fragment on the right extending cranially has improved from prior studies. There is possible impingement of the right L1 nerve root as noted previously. No significant spinal stenosis. L2-3: Disc bulging and endplate osteophyte formation. Mild facet degeneration and mild spinal stenosis. Mild foraminal narrowing bilaterally L3-4: Diffuse disc bulging and endplate spurring. Bilateral facet hypertrophy. Mild spinal stenosis. No change from the prior study. Mild subarticular and foraminal stenosis bilaterally. L4-5: Pedicle screw and interbody fusion. Fusion appears solid. Posterior decompression. Negative for spinal or foraminal stenosis L5-S1:  Negative IMPRESSION: Small extruded disc fragment on the right at L1-2 extending cranially has improved from the prior study. There is expected impingement of the right L1 nerve root. Mild spinal stenosis and mild foraminal stenosis bilaterally L2-3 Mild spinal stenosis L3-4 with mild subarticular foraminal stenosis bilaterally Solid fusion L4-5 without stenosis. Electronically Signed   By: Franchot Gallo M.D.   On: 10/03/2015 17:11    Scheduled Meds: . amLODipine  5 mg Oral Daily  . atorvastatin  10 mg Oral q1800  . B-complex with vitamin C  1 tablet Oral Daily  . buPROPion  300 mg  Oral Daily  . cholecalciferol  5,000 Units Oral Daily  . divalproex  1,500 mg Oral QHS  . emtricitabine-tenofovir AF  1 tablet Oral Daily  . feeding supplement (GLUCERNA SHAKE)  237 mL Oral Q24H  . insulin aspart  0-5 Units Subcutaneous QHS  . insulin aspart  0-9 Units Subcutaneous TID WC  . mirtazapine  45 mg Oral QHS  . multivitamin with minerals  1 tablet Oral q morning - 10a  . omega-3 acid ethyl esters  2 g Oral BID  . polyethylene glycol  17 g Oral Daily  . potassium chloride  40 mEq Oral Q4H  . QUEtiapine  400 mg Oral BID  . senna-docusate  2 tablet Oral BID    Continuous Infusions: . sodium chloride 75 mL/hr at 10/03/15 0342     Time spent: 90mins  Nilah Belcourt MD, PhD  [facility] Pager [phone]. If 7PM-7AM, please contact night-coverage at [email or web address], password [facility] 10/04/2015, 11:19 AM

## 2015-10-05 DIAGNOSIS — N179 Acute kidney failure, unspecified: Secondary | ICD-10-CM | POA: Diagnosis not present

## 2015-10-05 DIAGNOSIS — F329 Major depressive disorder, single episode, unspecified: Secondary | ICD-10-CM

## 2015-10-05 DIAGNOSIS — R4781 Slurred speech: Secondary | ICD-10-CM | POA: Diagnosis not present

## 2015-10-05 DIAGNOSIS — F411 Generalized anxiety disorder: Secondary | ICD-10-CM | POA: Diagnosis not present

## 2015-10-05 DIAGNOSIS — R531 Weakness: Secondary | ICD-10-CM

## 2015-10-05 DIAGNOSIS — W19XXXA Unspecified fall, initial encounter: Secondary | ICD-10-CM

## 2015-10-05 LAB — BASIC METABOLIC PANEL
Anion gap: 7 (ref 5–15)
BUN: 23 mg/dL — ABNORMAL HIGH (ref 6–20)
CALCIUM: 9 mg/dL (ref 8.9–10.3)
CHLORIDE: 113 mmol/L — AB (ref 101–111)
CO2: 25 mmol/L (ref 22–32)
CREATININE: 2.39 mg/dL — AB (ref 0.61–1.24)
GFR calc non Af Amer: 31 mL/min — ABNORMAL LOW (ref >60)
GFR, EST AFRICAN AMERICAN: 36 mL/min — AB (ref >60)
Glucose, Bld: 130 mg/dL — ABNORMAL HIGH (ref 65–99)
Potassium: 3.6 mmol/L (ref 3.5–5.1)
SODIUM: 145 mmol/L (ref 135–145)

## 2015-10-05 LAB — CBC
HCT: 39 % (ref 39.0–52.0)
HEMOGLOBIN: 12.8 g/dL — AB (ref 13.0–17.0)
MCH: 32.3 pg (ref 26.0–34.0)
MCHC: 32.8 g/dL (ref 30.0–36.0)
MCV: 98.5 fL (ref 78.0–100.0)
Platelets: 91 10*3/uL — ABNORMAL LOW (ref 150–400)
RBC: 3.96 MIL/uL — ABNORMAL LOW (ref 4.22–5.81)
RDW: 16.6 % — ABNORMAL HIGH (ref 11.5–15.5)
WBC: 5.8 10*3/uL (ref 4.0–10.5)

## 2015-10-05 LAB — GLUCOSE, CAPILLARY
GLUCOSE-CAPILLARY: 125 mg/dL — AB (ref 65–99)
GLUCOSE-CAPILLARY: 108 mg/dL — AB (ref 65–99)
Glucose-Capillary: 116 mg/dL — ABNORMAL HIGH (ref 65–99)

## 2015-10-05 LAB — MAGNESIUM: MAGNESIUM: 1.7 mg/dL (ref 1.7–2.4)

## 2015-10-05 MED ORDER — AMLODIPINE BESYLATE 5 MG PO TABS: 5.0000 mg | ORAL_TABLET | Freq: Every day | ORAL | Status: DC

## 2015-10-05 MED ORDER — TRAMADOL HCL 50 MG PO TABS: 50.0000 mg | ORAL_TABLET | Freq: Two times a day (BID) | ORAL | Status: DC | PRN

## 2015-10-05 MED ORDER — POLYETHYLENE GLYCOL 3350 17 G PO PACK: 17.0000 g | PACK | Freq: Every day | ORAL | Status: DC

## 2015-10-05 MED ORDER — INSULIN ASPART 100 UNIT/ML ~~LOC~~ SOLN: SUBCUTANEOUS | Status: DC

## 2015-10-05 MED ORDER — GLUCERNA SHAKE PO LIQD: 237.0000 mL | ORAL | Status: DC

## 2015-10-05 MED ORDER — MINERAL OIL RE ENEM
1.0000 | ENEMA | Freq: Once | RECTAL | Status: AC
  Administered 2015-10-05: 1 via RECTAL
  Filled 2015-10-05: qty 1

## 2015-10-05 MED ORDER — POTASSIUM CHLORIDE CRYS ER 20 MEQ PO TBCR
40.0000 meq | EXTENDED_RELEASE_TABLET | ORAL | Status: AC
  Administered 2015-10-05 (×2): 40 meq via ORAL
  Filled 2015-10-05 (×2): qty 2

## 2015-10-05 MED ORDER — SENNOSIDES-DOCUSATE SODIUM 8.6-50 MG PO TABS: 2.0000 | ORAL_TABLET | Freq: Two times a day (BID) | ORAL | Status: DC

## 2015-10-05 MED ORDER — POTASSIUM CHLORIDE ER 10 MEQ PO TBCR: 20.0000 meq | EXTENDED_RELEASE_TABLET | Freq: Every day | ORAL | Status: DC

## 2015-10-05 NOTE — Clinical Social Work Note (Signed)
30 day note signed and all requested information faxed to [facility] for PASARR review.   CSW remains available as needed.   Glendon Axe, MSW, LCSWA [phone] 10/05/2015 12:34 PM

## 2015-10-05 NOTE — Progress Notes (Signed)
Physical Therapy Treatment Patient Details Name: [name] MRN: [id number] DOB: Apr 19, 1966 Today's Date: 10/05/2015    History of Present Illness Patient is a 50 y/o male with hx of left THA, anxiety, depression, ADD< bipolar, PTSD, DM presents with shaking, slurred speech, bilateral leg weakness. MRI L spine-small extruded disc fragment on the right at L1-2, all other tests unremarkable.    PT Comments    Pt performed increased mobility, remains unsteady and would continue to benefit from ST SNF rehab to improve functional mobility and balance to decrease risk of falls.    Follow Up Recommendations  SNF     Equipment Recommendations  None recommended by PT    Recommendations for Other Services       Precautions / Restrictions Precautions Precautions: Fall Restrictions Weight Bearing Restrictions: No    Mobility  Bed Mobility   Bed Mobility: Supine to Sit;Sit to Supine     Supine to sit: Modified independent (Device/Increase time) Sit to supine: Modified independent (Device/Increase time)   General bed mobility comments: HOB flat, no use of bed rail but requires increased time.  Transfers Overall transfer level: Needs assistance Equipment used: Rolling walker (2 wheeled) Transfers: Sit to/from Stand Sit to Stand: Min guard         General transfer comment: Pt demonstrates LOB but corrects on bed with support from LEs.    Ambulation/Gait Ambulation/Gait assistance: Min assist Ambulation Distance (Feet): 140 Feet Assistive device: Rolling walker (2 wheeled) Gait Pattern/deviations: Step-through pattern;Decreased dorsiflexion - right;Decreased dorsiflexion - left;Narrow base of support;Scissoring;Shuffle Gait velocity: decreased   General Gait Details: Decreased foot clearance bilaterally with scissoring gait pattern and stumbling esp with head turns. 2 LOB. SOB.  Buckling noted in stance phase but no true LOB.   Stairs Stairs: Yes Stairs assistance: Min  assist Stair Management: Step to pattern;One rail Left;Forwards;Sideways Number of Stairs: 10 General stair comments: forward ascending and sideways descending.  Pt demonstrates LOB x1 required assist to correct.    Wheelchair Mobility    Modified Rankin (Stroke Patients Only)       Balance Overall balance assessment: Needs assistance   Sitting balance-Leahy Scale: Good       Standing balance-Leahy Scale: Poor                      Cognition Arousal/Alertness: Awake/alert Behavior During Therapy: Flat affect Overall Cognitive Status: Impaired/Different from baseline Area of Impairment: Safety/judgement;Problem solving;Memory;Attention;Following commands   Current Attention Level: Sustained Memory: Decreased short-term memory Following Commands: Follows one step commands with increased time Safety/Judgement: Decreased awareness of safety;Decreased awareness of deficits   Problem Solving: Slow processing;Requires verbal cues General Comments: tangential speech and difficulty completing a thought or story. Easily distracted.     Exercises      General Comments        Pertinent Vitals/Pain Pain Assessment: 0-10 Pain Score: 4  Pain Location: low back Pain Descriptors / Indicators: Aching;Discomfort Pain Intervention(s): Monitored during session;Repositioned    Home Living                      Prior Function            PT Goals (current goals can now be found in the care plan section) Acute Rehab PT Goals Patient Stated Goal: to find out what is going on and get better Potential to Achieve Goals: Good Progress towards PT goals: Progressing toward goals    Frequency  Min 3X/week  PT Plan Current plan remains appropriate    Co-evaluation             End of Session Equipment Utilized During Treatment: Gait belt Activity Tolerance: Patient tolerated treatment well Patient left: in bed;with call bell/phone within reach;with bed  alarm set     Time: [id number] PT Time Calculation (min) (ACUTE ONLY): 15 min  Charges:  $Gait Training: 8-22 mins                    G Codes:      Cristela Blue October 08, 2015, 4:38 PM  Governor Rooks, PTA pager [phone]

## 2015-10-05 NOTE — Discharge Summary (Addendum)
Discharge Summary  [name] [id number] DOB: 1965/11/17  PCP: [name], MD  Admit date: 10/02/2015 Discharge date: 10/05/2015  Time spent:> 45mins  Recommendations for Outpatient Follow-up:  1. F/u with PMD within a week  for hospital discharge follow up, repeat cbc/bmp at follow up, continue to monitor cr  2. F/u with psychiatry regularly, patient reported he goes to [facility] , Donnal Moat is her psychiatry provider.  3. F/u with neurosurgery Dr Vertell Limber prn, s/p back surgery. 4. F/u with orthopedics surgery prn, s/p hip surgery in 04/2015 per patient.   Discharge Diagnoses:  Active Hospital Problems   Diagnosis Date Noted  . Slurred speech 10/03/2015  . Leg weakness 10/03/2015  . Acute renal failure superimposed on stage 3 chronic kidney disease ([place]) 10/03/2015  . Hypokalemia 10/03/2015  . Dyslipidemia 05/31/2014  . Erectile dysfunction 02/17/2011  . Diabetes type 2, uncontrolled ([place]) 11/06/2010  . Hypogonadism male 05/20/2010  . Bipolar disorder ([place]) 01/18/2010  . Depression 01/18/2010  . Anxiety state 12/05/2006  . Essential hypertension 12/05/2006    Resolved Hospital Problems   Diagnosis Date Noted Date Resolved  No resolved problems to display.    Discharge Condition: stable  Diet recommendation: heart healthy/carb modified  Filed Weights   10/03/15 0303  Weight: 97.9 kg (215 lb 13.3 oz)    History of present illness:  PCP: [name], MD   Patient coming from: The patient is coming from home. At baseline, pt is independent for most of ADL.   Chief Complaint:   HPI: [name] is a 50 y.o. male with medical history significant of hypogonadism, diabetes mellitus, bipolar disorder, hypertension, hyperlipidemia, diabetes mellitus, depression, anxiety, ADD, remote seizure (7 or 50 yo), CKD-III, who presents with shaking, slurred speech, bilateral leg weakness.  Pt states that he has hx of bilateral hip replacement,  causing bilateral leg weakness. Per report, he has slurring of speech in the past 3 months, which is usually associated with extreme anxiety. He also reports that he has a remote history of seizures and he was having shaking spells during this event. Per family, pt had slurred speech and poor balance when he was walking yesterday. He does not have vision change or hearing loss. Patient denies chest pain, shortness breath, abdominal pain, diarrhea, symptoms of UTI. No nausea, vomiting or diarrhea.  ED Course: pt was found to have INR 1.27, PTT 27, negative troponin, WBC 6.4, temperature normal, no tachycardia, no tachypnea, potassium is 3.3, worsening renal function, negative CT head and negative MRI of the brain for acute intracranial abnormalities. Patient is placed on telemetry bed for observation. Neurology was consulted.  Hospital Course:  Principal Problem:   Slurred speech Active Problems:   Bipolar disorder ([place])   Anxiety state   Depression   Essential hypertension   Hypogonadism male   Diabetes type 2, uncontrolled ([place])   Erectile dysfunction   Dyslipidemia   Leg weakness   Acute renal failure superimposed on stage 3 chronic kidney disease (H[facility])   Hypokalemia   Slurred speech, shaking spell, bilateral leg weakness: not typical for TIA/stroke,Ct-head and MRI-brain are negative for acute intracranial abnormalities. EEG unremarkable, lumbar spine mri showed improvement compare to prior imaging in 2014 -PT/OT/speech/SNF -Seizure precaution -neurology input appreciated  Bipolar, ADD, PTSD: Patient is on Depakote - Depakote level 49 -Continue Depakote -regularly followed by psychiatry   Depression and anxiety: Stable, no suicidal or homicidal ideations. -Continue home medications: Wellbutrin, Remeron, Seroquel, prn Klonopin,  HTN: -discontinue Hyzarr  due to worsening renal function -Start amlodipine 5 mg daily   Hypogonadism male: Used to be on clomiphen, currently on  testosterone weekly, last dose was 2 days ago -Continue testosterone weekly  noninsulin dependent DM-II: Last A1c 5.5 on 07/16/15, well controled. Home med metformin held since admission, discontinue metfromin due to renal impairment. -SSI  HLD: Last LDL was 84 on 12/11/06 -Continue home medications: Lipitor  AKI: AoCKD-III:  Baseline Cre is 1.2-2.4, pt's Cr 3.21 and BUN 23 is on admission. Likely due to ARB / diruetics/metaformin  -ua on infection -- US-renal, non obstructing stone on the right side -  s/p IVF: NS 75 cc/h, discontinue hyzaar and metformin cr improving  Hypokalemia:  - continue to Replete - Mg level wnl  FTT: h/o hip and back surgery, followed by ortho and neurosurgery, continue pt/ot, insurance declined snf placement, family agrees to patient being discharged home with home health   DVT ppx: SCD Code Status: Full code Family Communication: None at bed side.  Disposition Plan: home with home health Consults called: Neurology   Procedures:  EEG  Antibiotics:  none   Discharge Exam: BP 136/89 mmHg  Pulse 89  Temp(Src) 98 F (36.7 C) (Oral)  Resp 15  Ht 6\' 5"  (1.956 m)  Wt 97.9 kg (215 lb 13.3 oz)  BMI 25.59 kg/m2  SpO2 98%    General: NAD  Cardiovascular: RRR  Respiratory: CTABL  Abdomen: Soft/ND/NT, positive BS  Musculoskeletal: No Edema  Neuro: aaox3  Discharge Instructions You were cared for by a hospitalist during your hospital stay. If you have any questions about your discharge medications or the care you received while you were in the hospital after you are discharged, you can call the unit and asked to speak with the hospitalist on call if the hospitalist that took care of you is not available. Once you are discharged, your primary care physician will handle any further medical issues. Please note that NO REFILLS for any discharge medications will be authorized once you are discharged, as it is imperative that you return to  your primary care physician (or establish a relationship with a primary care physician if you do not have one) for your aftercare needs so that they can reassess your need for medications and monitor your lab values.      Discharge Instructions    Diet - low sodium heart healthy    Complete by:  As directed   Carb modified     Face-to-face encounter (required for Medicare/Medicaid patients)    Complete by:  As directed   [name] certify that this patient is under my care and that Jonathan, or a nurse practitioner or physician's assistant working with me, had a face-to-face encounter that meets the physician face-to-face encounter requirements with this patient on 10/05/2015. The encounter with the patient was in whole, or in part for the following medical condition(s) which is the primary reason for home health care (List medical condition): FTT  The encounter with the patient was in whole, or in part, for the following medical condition, which is the primary reason for home health care:  FTT  Jonathan certify that, based on my findings, the following services are medically necessary home health services:   Nursing Physical therapy    Reason for Medically Necessary Home Health Services:  Skilled Nursing- Change/Decline in Patient Status  My clinical findings support the need for the above services:  Pain interferes with ambulation/mobility  Further, Jonathan certify that my clinical  findings support that this patient is homebound due to:  Unsafe ambulation due to balance issues     Home Health    Complete by:  As directed   To provide the following care/treatments:   PT OT [place] work SLP       Increase activity slowly    Complete by:  As directed             Medication List    STOP taking these medications        losartan-hydrochlorothiazide 100-12.5 MG tablet  Commonly known as:  HYZAAR     metFORMIN 1000 MG tablet  Commonly known as:  GLUCOPHAGE      TAKE these  medications        amLODipine 5 MG tablet  Commonly known as:  NORVASC  Take 1 tablet (5 mg total) by mouth daily.     atorvastatin 10 MG tablet  Commonly known as:  LIPITOR  Take 1 tablet (10 mg total) by mouth daily.     b complex vitamins tablet  Take 1 tablet by mouth daily. Reported on 07/09/2015     buPROPion 150 MG 24 hr tablet  Commonly known as:  WELLBUTRIN XL  Take 150 mg by mouth at bedtime.     buPROPion 300 MG 24 hr tablet  Commonly known as:  WELLBUTRIN XL  Take 1 tablet by mouth daily. 300 mg in the morning and 150 mg in the evening.     cholecalciferol 1000 units tablet  Commonly known as:  VITAMIN D  Take 5,000 Units by mouth daily.     clonazePAM 2 MG tablet  Commonly known as:  KLONOPIN  Take 2 mg by mouth 2 (two) times daily as needed for anxiety.     divalproex 500 MG 24 hr tablet  Commonly known as:  DEPAKOTE ER  Take 1,500 mg by mouth at bedtime.     emtricitabine-tenofovir 200-300 MG tablet  Commonly known as:  TRUVADA  Take 1 tablet by mouth daily.     eszopiclone 3 MG Tabs  Generic drug:  Eszopiclone  Take 3 mg by mouth at bedtime. Take immediately before bedtime     feeding supplement (GLUCERNA SHAKE) Liqd  Take 237 mLs by mouth daily.     insulin aspart 100 UNIT/ML injection  Commonly known as:  novoLOG  Before each meal 3 times a day, 140-199 - 2 units, 200-250 - 4 units, 251-299 - 6 units,  300-349 - 8 units,  350 or above 10 units. Insulin PEN if approved, provide syringes and needles if needed.     mirtazapine 45 MG tablet  Commonly known as:  REMERON  Take 1 tablet (45 mg total) by mouth at bedtime.     multivitamin with minerals Tabs tablet  Take 1 tablet by mouth every morning.     polyethylene glycol packet  Commonly known as:  MIRALAX / GLYCOLAX  Take 17 g by mouth daily.     potassium chloride 10 MEQ tablet  Commonly known as:  K-DUR  Take 2 tablets (20 mEq total) by mouth daily.     QUEtiapine 400 MG tablet    Commonly known as:  SEROQUEL  Take 1 tablet by mouth 2 (two) times daily.     senna-docusate 8.6-50 MG tablet  Commonly known as:  Senokot-S  Take 2 tablets by mouth 2 (two) times daily.     sildenafil 20 MG tablet  Commonly known as:  REVATIO  1-5 pills, as needed for ED symptoms     testosterone cypionate 200 MG/ML injection  Commonly known as:  DEPOTESTOSTERONE CYPIONATE  Inject 75 mg into the muscle once a week.     traMADol 50 MG tablet  Commonly known as:  ULTRAM  Take 1 tablet (50 mg total) by mouth every 12 (twelve) hours as needed for moderate pain.       Allergies  Allergen Reactions  . Vicodin [Hydrocodone-Acetaminophen] Itching   Follow-up Information    Follow up with HURST, TERESA, PA-C.   Specialty:  General Practice   Why:  [facility]      Follow up with [name], MD In 1 week.   Specialty:  Internal Medicine   Why:  hospital discharge follow up, repeat cbc/bmp   Contact information:   [place] [address] [phone]       Follow up with [facility].   Specialty:  [place] information:   [id number] [phone]       The results of significant diagnostics from this hospitalization (including imaging, microbiology, ancillary and laboratory) are listed below for reference.    Significant Diagnostic Studies: Ct Head Wo Contrast  10/02/2015  CLINICAL DATA:  Slurred speech, right facial droop, difficulty with balance. Hypertension and diabetes. EXAM: CT HEAD WITHOUT CONTRAST TECHNIQUE: Contiguous axial images were obtained from the base of the skull through the vertex without intravenous contrast. COMPARISON:  None. FINDINGS: Brain: Generalized brain atrophy, moderate in degree for age, with commensurate dilatation of the ventricles and sulci. All areas of the brain demonstrate normal gray-white matter differentiation.  There is no mass, hemorrhage, edema or other evidence of acute parenchymal abnormality. No extra-axial hemorrhage. Vascular: No hyperdense vessel or unexpected calcification. Skull: Negative for fracture or focal lesion. Sinuses/Orbits: No acute findings. Visualized upper paranasal sinuses are clear. Other: None. IMPRESSION: 1. No acute intracranial abnormality. No intracranial mass, hemorrhage or edema. 2. Atrophy. Electronically Signed   By: Franki Cabot M.D.   On: 10/02/2015 20:49   Mr Brain Wo Contrast  10/03/2015  CLINICAL DATA:  LEFT-sided weakness beginning yesterday. Intermittent LEFT-sided weakness and slurred speech for 3 months, associated with anxiety. History of seizures. Evaluate RIGHT body weakness. History of diabetes. EXAM: MRI HEAD WITHOUT CONTRAST TECHNIQUE: Multiplanar, multiecho pulse sequences of the brain and surrounding structures were obtained without intravenous contrast. COMPARISON:  CT HEAD October 02, 2015 an MRI head December 20, 2005 FINDINGS: INTRACRANIAL CONTENTS: No reduced diffusion to suggest acute ischemia. No susceptibility artifact to suggest hemorrhage. Mild to moderate ventriculomegaly on the basis of global parenchymal brain volume loss. No suspicious parenchymal signal, masses or mass effect. A few scattered subcentimeter supratentorial white matter FLAIR T2 hyperintensities are nonspecific. No abnormal extra-axial fluid collections. No extra-axial masses though, contrast enhanced sequences would be more sensitive. Normal major intracranial vascular flow voids present at skull base. Dolicoectatic tortuous LEFT vertebral artery as previously seen. ORBITS: The included ocular globes and orbital contents are non-suspicious. SINUSES: Minimal RIGHT ethmoid mucosal thickening without paranasal sinus air-fluid levels. Mastoid air cells are well aerated. SKULL/SOFT TISSUES: No abnormal sellar expansion. No suspicious calvarial bone marrow signal. Craniocervical junction  maintained. IMPRESSION: No acute intracranial process. Mild to moderate global brain parenchymal volume loss for age. Similarly dolicoectatic LEFT vertebral artery. Electronically Signed   By: Elon Alas M.D.   On: 10/03/2015 02:18  Mr Lumbar Spine Wo Contrast  10/03/2015  CLINICAL DATA:  Bilateral leg weakness. EXAM: MRI LUMBAR SPINE WITHOUT CONTRAST TECHNIQUE: Multiplanar, multisequence MR imaging of the lumbar spine was performed. No intravenous contrast was administered. COMPARISON:  MRI 07/04/2014, 07/25/2013 FINDINGS: Segmentation:  Normal segmentation.  Lowest disc space L5-S1 Alignment:  Normal Vertebrae: Normal appearing bone marrow. Negative for fracture or mass lesion. Conus medullaris: Extends to the T12-L1 level and appears normal. Paraspinal and other soft tissues: Paraspinous muscles are normal and symmetric. No retroperitoneal mass or adenopathy. Disc levels: T12-L1:  Negative L1-2: Small extruded disc fragment on the right extending cranially has improved from prior studies. There is possible impingement of the right L1 nerve root as noted previously. No significant spinal stenosis. L2-3: Disc bulging and endplate osteophyte formation. Mild facet degeneration and mild spinal stenosis. Mild foraminal narrowing bilaterally L3-4: Diffuse disc bulging and endplate spurring. Bilateral facet hypertrophy. Mild spinal stenosis. No change from the prior study. Mild subarticular and foraminal stenosis bilaterally. L4-5: Pedicle screw and interbody fusion. Fusion appears solid. Posterior decompression. Negative for spinal or foraminal stenosis L5-S1:  Negative IMPRESSION: Small extruded disc fragment on the right at L1-2 extending cranially has improved from the prior study. There is expected impingement of the right L1 nerve root. Mild spinal stenosis and mild foraminal stenosis bilaterally L2-3 Mild spinal stenosis L3-4 with mild subarticular foraminal stenosis bilaterally Solid fusion L4-5  without stenosis. Electronically Signed   By: Franchot Gallo M.D.   On: 10/03/2015 17:11   US Renal  10/03/2015  CLINICAL DATA:  Acute kidney injury. History of renal stones and hypertension. EXAM: RENAL / URINARY TRACT ULTRASOUND COMPLETE COMPARISON:  CT abdomen and pelvis 06/14/2013 FINDINGS: Right Kidney: Length: 11.1 cm. Echogenic structure, likely a stone demonstrated in the midpole of the right kidney. No hydronephrosis. No solid mass lesions. Left Kidney: Length: 12 cm. Echogenicity within normal limits. No mass or hydronephrosis visualized. Bladder: No wall thickening or filling defects. Bilateral urine flow jets demonstrated on color flow Doppler imaging. IMPRESSION: Nonobstructing stone in the right kidney.  No hydronephrosis. Electronically Signed   By: Lucienne Capers M.D.   On: 10/03/2015 02:39    Microbiology: No results found for this or any previous visit (from the past 240 hour(s)).   Labs: Basic Metabolic Panel:  Recent Labs Lab 10/02/15 2000 10/03/15 0434 10/04/15 0611 10/05/15 0751  NA 140  --  143 145  K 3.3*  --  2.9* 3.6  CL 104  --  107 113*  CO2 27  --  26 25  GLUCOSE 131*  --  128* 130*  BUN 23*  --  20 23*  CREATININE 3.21*  --  2.53* 2.39*  CALCIUM 9.1  --  9.1 9.0  MG  --  1.9  --  1.7   Liver Function Tests:  Recent Labs Lab 10/02/15 2000  AST 45*  ALT 35  ALKPHOS 52  BILITOT 1.0  PROT 6.5  ALBUMIN 3.6   No results for input(s): LIPASE, AMYLASE in the last 168 hours. No results for input(s): AMMONIA in the last 168 hours. CBC:  Recent Labs Lab 10/02/15 2000 10/04/15 0611 10/05/15 0751  WBC 6.4 4.7 5.8  NEUTROABS 4.4  --   --   HGB 11.9* 12.0* 12.8*  HCT 35.6* 35.5* 39.0  MCV 96.5 96.7 98.5  PLT 98* 82* 91*   Cardiac Enzymes: No results for input(s): CKTOTAL, CKMB, CKMBINDEX, TROPONINI in the last 168 hours. BNP: BNP (last 3 results)  No results for input(s): BNP in the last 8760 hours.  ProBNP (last 3 results) No results for  input(s): PROBNP in the last 8760 hours.  CBG:  Recent Labs Lab 10/04/15 1625 10/04/15 2119 10/05/15 0619 10/05/15 1144 10/05/15 1647  GLUCAP 198* 170* 125* 116* 108*       Signed:  Paeton Latouche MD, PhD  [facility] 10/05/2015, 5:34 PM

## 2015-10-05 NOTE — Progress Notes (Signed)
Pt discharge education and instructions completed with pt and sister at bedside; both voices understanding and denies any questions. Pt IV and telemetry removed; pt handed his prescriptions for tramadol and insulin. Pt to pick up other electronically sent prescriptions from preferred pharmacy on file. Pt his discharge home with sister to transport him home. Pt also handed list of private duty agencies provided by case management. Pt transported off unit via wheelchair with belongings and sister to the side. P[employer] RN

## 2015-10-05 NOTE — Care Management Note (Signed)
Case Management Note  Patient Details  Name: [name] MRN: [id number] Date of Birth: 1965/11/01  Subjective/Objective:                    Action/Plan: Pt discharging to home. Pt denied for SNF rehab stay. CM met with the patient and provided him a list of [facility] agencies in the [place] area. He selected [facility]. Information faxed to [facility]. CM also spoke with the patients sister and informed her of discharge to home. Sister interested in private duty sitter list. Will leave a copy with the patient. Will update the bedside RN.  Expected Discharge Date:                  Expected Discharge Plan:  Skilled Nursing Facility  In-House Referral:  Clinical Social Work  Discharge planning Services  CM Consult  Post Acute Care Choice:  Home Health Choice offered to:  Patient, Sibling  DME Arranged:    DME Agency:     HH Arranged:  RN, PT, OT, Nurse's Aide, Speech Therapy, Social Work [employer] Agency:  [facility]  Status of Service:  Completed, signed off  Medicare Important Message Given:    Date Medicare IM Given:    Medicare IM give by:    Date Additional Medicare IM Given:    Additional Medicare Important Message give by:     If discussed at [place] of Stay Meetings, dates discussed:    Additional Comments:  Pollie Friar, RN 10/05/2015, 5:55 PM

## 2015-10-05 NOTE — Evaluation (Signed)
Occupational Therapy Evaluation Patient Details Name: [name] MRN: [id number] DOB: [birth year] Today's Date: 10/05/2015    History of Present Illness Patient is a 50 y/o male with hx of left THA, anxiety, depression, ADD< bipolar, PTSD, DM presents with shaking, slurred speech, bilateral leg weakness. MRI L spine-small extruded disc fragment on the right at L1-2, all other tests unremarkable.   Clinical Impression   Pt reports he was managing ADLs independently PTA but had hx of falls at home. Pt currently requires min assist overall for functional mobility and ADLs in standing secondary to balance deficits. Discussed home safety with pt; he identifies that safety is not an issue at home. Pt with SOB and reporting dizziness following short distance functional mobility in room; SpO2=95% on RA; SOB and dizziness quickly resolved. Recommending SNF and 24/7 supervision for follow up in order to maximize independence and safety with ADLs and functional mobility prior to return home. Pt would benefit from continued skilled OT to address established goals.     Follow Up Recommendations  SNF;Supervision/Assistance - 24 hour    Equipment Recommendations  Tub/shower seat    Recommendations for Other Services       Precautions / Restrictions Precautions Precautions: Fall Restrictions Weight Bearing Restrictions: No      Mobility Bed Mobility Overal bed mobility: Modified Independent             General bed mobility comments: HOB flat, no use of bed rail but requires increased time.  Transfers Overall transfer level: Needs assistance Equipment used: Straight cane Transfers: Sit to/from Stand Sit to Stand: Min assist         General transfer comment: Min assist for balance deficits. Sit to stand from EOB x 1, couch x 1.    Balance Overall balance assessment: Needs assistance Sitting-balance support: Feet supported;No upper extremity supported Sitting balance-Leahy  Scale: Good     Standing balance support: No upper extremity supported;During functional activity Standing balance-Leahy Scale: Poor                              ADL Overall ADL's : Needs assistance/impaired Eating/Feeding: Set up;Sitting   Grooming: Minimal assistance;Standing;Oral care;Wash/dry hands   Upper Body Bathing: Min guard;Sitting   Lower Body Bathing: Minimal assistance;Sit to/from stand   Upper Body Dressing : Min guard;Sitting   Lower Body Dressing: Minimal assistance;Sit to/from stand   Toilet Transfer: Minimal assistance;Ambulation;Comfort height toilet [facility] [place]) Toilet Transfer Details (indicate cue type and reason): Simulated by sit to stand from [place] and Hygiene: Minimal assistance;Sit to/from stand       Functional mobility during ADLs: Minimal assistance;Cane General ADL Comments: Pt easily distracted and jumps from one story to another; reports due to ADHD. Reports his sister was present when he came to the ED and provided all of his symptoms. He reports she didnt tell anyone his "real" symptoms; reports he quickly gets SOB, feels dizzy intermittently, feels overall weaker. Pt noted to have increased SOB with short distance functional mobility in room; SpO2= 95 on RA. Pt very unsteady on feet; staggering from side to side and getting tripped up on his cane.     Vision Additional Comments: Appears WFL but requires further testing.   Perception     Praxis      Pertinent Vitals/Pain Pain Assessment: Faces Faces Pain Scale: Hurts little more Pain Location: back, L hip, +nausea Pain Descriptors / Indicators: Aching Pain  Intervention(s): Monitored during session     Hand Dominance Right   Extremity/Trunk Assessment Upper Extremity Assessment Upper Extremity Assessment: Generalized weakness   Lower Extremity Assessment Lower Extremity Assessment: Defer to PT evaluation   Cervical / Trunk Assessment Cervical  / Trunk Assessment: Normal   Communication Communication Communication: No difficulties   Cognition Arousal/Alertness: Awake/alert Behavior During Therapy: Flat affect Overall Cognitive Status: Impaired/Different from baseline Area of Impairment: Safety/judgement;Problem solving;Memory;Attention;Following commands   Current Attention Level: Sustained Memory: Decreased short-term memory Following Commands: Follows one step commands with increased time Safety/Judgement: Decreased awareness of safety;Decreased awareness of deficits   Problem Solving: Slow processing;Requires verbal cues General Comments: tangential speech and difficulty completing a thought or story. Easily distracted.    General Comments       Exercises       Shoulder Instructions      Home Living Family/patient expects to be discharged to:: Private residence Living Arrangements: Other relatives (sister and her husband) Available Help at Discharge: Family;Available PRN/intermittently Type of Home: House Home Access: Stairs to enter [employer] of Steps: 2 Entrance Stairs-Rails: None Home Layout: Two level Alternate Level Stairs-Number of Steps: 1 flight (split) Alternate Level Stairs-Rails: Left Bathroom Shower/Tub: Tub/shower unit;Curtain Shower/tub characteristics: Architectural technologist: Standard     Home Equipment: Cane - single point;Walker - 2 wheels;Bedside commode   Additional Comments: Lives with sister and her family.      Prior Functioning/Environment Level of Independence: Independent with assistive device(s)        Comments: Uses SPC for ambulation. Reports multiple falls.    OT Diagnosis: Generalized weakness;Acute pain;Cognitive deficits   OT Problem List: Decreased strength;Decreased activity tolerance;Impaired balance (sitting and/or standing);Decreased cognition;Decreased coordination;Decreased safety awareness;Decreased knowledge of use of DME or AE;Pain   OT  Treatment/Interventions: Self-care/ADL training;Therapeutic exercise;Energy conservation;DME and/or AE instruction;Therapeutic activities;Patient/family education;Balance training;Cognitive remediation/compensation    OT Goals(Current goals can be found in the care plan section) Acute Rehab OT Goals Patient Stated Goal: to find out what is going on and get better OT Goal Formulation: With patient Time For Goal Achievement: 10/19/15 Potential to Achieve Goals: Fair  OT Frequency: Min 2X/week   Barriers to D/C:            Co-evaluation              End of Session Equipment Utilized During Treatment: Gait belt;Other (comment) (SCP)  Activity Tolerance: Patient tolerated treatment well Patient left: in bed;with call bell/phone within reach;with bed alarm set;with nursing/sitter in room   Time: 1125-1146 OT Time Calculation (min): 21 min Charges:  OT General Charges $OT Visit: 1 Procedure OT Evaluation $OT Eval Moderate Complexity: 1 Procedure G-Codes: OT G-codes **NOT FOR INPATIENT CLASS** Functional Assessment Tool Used: Clinical judgement Functional Limitation: Self care Self Care Current Status [id number]): At least 1 percent but less than 20 percent impaired, limited or restricted Self Care Goal Status [id number]): At least 1 percent but less than 20 percent impaired, limited or restricted   Binnie Kand M.S., OTR/L Pager: [phone]  10/05/2015, 12:10 PM

## 2015-10-05 NOTE — Clinical Social Work Note (Signed)
CSW received phone from [facility] indicating that patient does not have a skilled need for SNF placement. [employer] RNCM sending request to [facility].   CSW will await decision.  Glendon Axe, MSW, LCSWA [phone] 10/05/2015 3:11 PM

## 2015-10-05 NOTE — Clinical Social Work Note (Signed)
MD informed that it is likely that facility will not obtained [employer] authorization today as well as the possibility of patient not being approved at all.   Case discussed with CSW AD. Letter of Guarantee will not be extended for SNF placement/ pending authorization. If insurance does not approve SNF stay- patient will need to d/c home with home health. RNCM aware.    Glendon Axe, MSW, LCSWA [phone] 10/05/2015 3:00 PM

## 2015-10-05 NOTE — Clinical Social Work Note (Signed)
[place] MUST PASARR obtained: [id number] Audry Pili, MSW, LCSWA [phone] 10/05/2015 2:37 PM

## 2015-10-05 NOTE — Clinical Social Work Note (Addendum)
Patient with level II PASARR. 30 day note placed on chart for MD signature.  Clinical Social Worker met with patient at bedside to present bed offers. Patient preferred [facility] however facility declined extending bed offer. Patient aware that he is not positive for any acute findings (stroke) and does not believe [employer]BS will approve a rehab stay however "would like to try and see what happens".   Patient chooses bed at [facility]. Facility notified and starting  [employer] authorization.  CSW remains available as needed.   Glendon Axe, MSW, LCSWA [phone] 10/05/2015 10:34 AM

## 2015-10-08 NOTE — Progress Notes (Signed)
10/08/2015 0900: Spoke with Tiffany with [facility]. They received referral on Mr Sciarretta and have accepted the referral.

## 2015-10-12 ENCOUNTER — Encounter: Payer: Self-pay | Admitting: Internal Medicine

## 2015-10-12 ENCOUNTER — Ambulatory Visit (INDEPENDENT_AMBULATORY_CARE_PROVIDER_SITE_OTHER): Payer: BLUE CROSS/BLUE SHIELD | Admitting: Internal Medicine

## 2015-10-12 ENCOUNTER — Other Ambulatory Visit (INDEPENDENT_AMBULATORY_CARE_PROVIDER_SITE_OTHER): Payer: BLUE CROSS/BLUE SHIELD

## 2015-10-12 VITALS — BP 120/80 | HR 106 | Wt 205.0 lb

## 2015-10-12 DIAGNOSIS — R4781 Slurred speech: Secondary | ICD-10-CM | POA: Diagnosis not present

## 2015-10-12 DIAGNOSIS — N183 Chronic kidney disease, stage 3 unspecified: Secondary | ICD-10-CM

## 2015-10-12 DIAGNOSIS — R29898 Other symptoms and signs involving the musculoskeletal system: Secondary | ICD-10-CM | POA: Diagnosis not present

## 2015-10-12 DIAGNOSIS — R270 Ataxia, unspecified: Secondary | ICD-10-CM

## 2015-10-12 LAB — HEPATIC FUNCTION PANEL
ALT: 33 U/L (ref 0–53)
AST: 40 U/L — ABNORMAL HIGH (ref 0–37)
Albumin: 4.4 g/dL (ref 3.5–5.2)
Alkaline Phosphatase: 60 U/L (ref 39–117)
BILIRUBIN TOTAL: 0.5 mg/dL (ref 0.2–1.2)
Bilirubin, Direct: 0.1 mg/dL (ref 0.0–0.3)
TOTAL PROTEIN: 7.2 g/dL (ref 6.0–8.3)

## 2015-10-12 LAB — CK: CK TOTAL: 107 U/L (ref 7–232)

## 2015-10-12 LAB — SEDIMENTATION RATE: Sed Rate: 1 mm/hr (ref 0–15)

## 2015-10-12 MED ORDER — QUETIAPINE FUMARATE 400 MG PO TABS: 400.0000 mg | ORAL_TABLET | Freq: Every day | ORAL | Status: DC

## 2015-10-12 MED ORDER — AMLODIPINE BESYLATE 5 MG PO TABS: 5.0000 mg | ORAL_TABLET | Freq: Every day | ORAL | Status: DC

## 2015-10-12 NOTE — Progress Notes (Signed)
Subjective:  Patient ID: [name], male    DOB: Jul 11, 1965  Age: 50 y.o. MRN: [id number]  CC: No chief complaint on file.   HPI RUSBEL IWEN presents for falls, unsteady gait, coccyx pain. He was hospitalized 6/13-6/16 for falls, shaking, slurred speech. Brain MRI was ok. He improved, however he is still weak on his legs and somewhat unsteady.  Outpatient Prescriptions Prior to Visit  Medication Sig Dispense Refill  . amLODipine (NORVASC) 5 MG tablet Take 1 tablet (5 mg total) by mouth daily. 30 tablet 0  . atorvastatin (LIPITOR) 10 MG tablet Take 1 tablet (10 mg total) by mouth daily. 90 tablet 3  . b complex vitamins tablet Take 1 tablet by mouth daily. Reported on 07/09/2015    . buPROPion (WELLBUTRIN XL) 150 MG 24 hr tablet Take 150 mg by mouth at bedtime.    Marland Kitchen buPROPion (WELLBUTRIN XL) 300 MG 24 hr tablet Take 1 tablet by mouth daily. 300 mg in the morning and 150 mg in the evening.    . cholecalciferol (VITAMIN D) 1000 UNITS tablet Take 5,000 Units by mouth daily.     . clonazePAM (KLONOPIN) 2 MG tablet Take 2 mg by mouth 2 (two) times daily as needed for anxiety.     . divalproex (DEPAKOTE ER) 500 MG 24 hr tablet Take 1,500 mg by mouth at bedtime.     . Eszopiclone (ESZOPICLONE) 3 MG TABS Take 3 mg by mouth at bedtime. Take immediately before bedtime    . feeding supplement, GLUCERNA SHAKE, (GLUCERNA SHAKE) LIQD Take 237 mLs by mouth daily. 30 Can 0  . insulin aspart (NOVOLOG) 100 UNIT/ML injection Before each meal 3 times a day, 140-199 - 2 units, 200-250 - 4 units, 251-299 - 6 units,  300-349 - 8 units,  350 or above 10 units. Insulin PEN if approved, provide syringes and needles if needed. 10 mL 0  . mirtazapine (REMERON) 45 MG tablet Take 1 tablet (45 mg total) by mouth at bedtime. 30 tablet 5  . Multiple Vitamin (MULTIVITAMIN WITH MINERALS) TABS Take 1 tablet by mouth every morning.    . polyethylene glycol (MIRALAX / GLYCOLAX) packet Take 17 g by mouth daily. 14 each 0    . potassium chloride (K-DUR) 10 MEQ tablet Take 2 tablets (20 mEq total) by mouth daily. 20 tablet 0  . QUEtiapine (SEROQUEL) 400 MG tablet Take 1 tablet by mouth 2 (two) times daily.    Marland Kitchen senna-docusate (SENOKOT-S) 8.6-50 MG tablet Take 2 tablets by mouth 2 (two) times daily. 30 tablet 0  . sildenafil (REVATIO) 20 MG tablet 1-5 pills, as needed for ED symptoms 60 tablet 5  . testosterone cypionate (DEPOTESTOSTERONE CYPIONATE) 200 MG/ML injection Inject 75 mg into the muscle once a week.    . traMADol (ULTRAM) 50 MG tablet Take 1 tablet (50 mg total) by mouth every 12 (twelve) hours as needed for moderate pain. 10 tablet 0  . emtricitabine-tenofovir (TRUVADA) 200-300 MG tablet Take 1 tablet by mouth daily. (Patient not taking: Reported on 10/12/2015) 90 tablet 3   No facility-administered medications prior to visit.    ROS Review of Systems  Constitutional: Positive for fatigue. Negative for appetite change and unexpected weight change.  HENT: Negative for congestion, nosebleeds, sneezing, sore throat and trouble swallowing.   Eyes: Negative for itching and visual disturbance.  Respiratory: Negative for cough.   Cardiovascular: Negative for chest pain, palpitations and leg swelling.  Gastrointestinal: Negative for nausea, diarrhea, blood  in stool and abdominal distention.  Genitourinary: Negative for frequency and hematuria.  Musculoskeletal: Positive for back pain, arthralgias and gait problem. Negative for joint swelling and neck pain.  Skin: Negative for rash.  Neurological: Positive for light-headedness. Negative for dizziness, tremors, speech difficulty and weakness.  Psychiatric/Behavioral: Positive for sleep disturbance and decreased concentration. Negative for suicidal ideas, behavioral problems, dysphoric mood and agitation. The patient is nervous/anxious.     Objective:  BP 120/80 mmHg  Pulse 106  Wt 205 lb (92.987 kg)  SpO2 96%  BP Readings from Last 3 Encounters:   10/12/15 120/80  10/05/15 147/93  09/26/15 160/98    Wt Readings from Last 3 Encounters:  10/12/15 205 lb (92.987 kg)  10/03/15 215 lb 13.3 oz (97.9 kg)  09/26/15 207 lb (93.895 kg)    Physical Exam  Constitutional: He is oriented to person, place, and time. He appears well-developed. No distress.  NAD  HENT:  Mouth/Throat: Oropharynx is clear and moist.  Eyes: Conjunctivae are normal. Pupils are equal, round, and reactive to light.  Neck: Normal range of motion. No JVD present. No thyromegaly present.  Cardiovascular: Normal rate, regular rhythm, normal heart sounds and intact distal pulses.  Exam reveals no gallop and no friction rub.   No murmur heard. Pulmonary/Chest: Effort normal and breath sounds normal. No respiratory distress. He has no wheezes. He has no rales. He exhibits no tenderness.  Abdominal: Soft. Bowel sounds are normal. He exhibits no distension and no mass. There is no tenderness. There is no rebound and no guarding.  Musculoskeletal: Normal range of motion. He exhibits tenderness. He exhibits no edema.  Lymphadenopathy:    He has no cervical adenopathy.  Neurological: He is alert and oriented to person, place, and time. He has normal reflexes. No cranial nerve deficit. He exhibits normal muscle tone. He displays a negative Romberg sign. Coordination abnormal. Gait normal.  Skin: Skin is warm and dry. No rash noted.  Psychiatric: He has a normal mood and affect. His behavior is normal. Judgment and thought content normal.  cane  Tremor in hands Ataxic gait MS is 4-/5 in LEs  Lab Results  Component Value Date   WBC 5.8 10/05/2015   HGB 12.8* 10/05/2015   HCT 39.0 10/05/2015   PLT 91* 10/05/2015   GLUCOSE 130* 10/05/2015   CHOL 90 10/03/2015   TRIG 205* 10/03/2015   HDL 21* 10/03/2015   LDLDIRECT 102.0 04/18/2015   LDLCALC 28 10/03/2015   ALT 35 10/02/2015   AST 45* 10/02/2015   NA 145 10/05/2015   K 3.6 10/05/2015   CL 113* 10/05/2015    CREATININE 2.39* 10/05/2015   BUN 23* 10/05/2015   CO2 25 10/05/2015   TSH 1.55 04/18/2015   PSA 2.46 04/18/2015   INR 1.27 10/02/2015   HGBA1C 4.9 10/03/2015   MICROALBUR 15.8* 04/18/2015    Mr Brain Wo Contrast  10/03/2015  CLINICAL DATA:  LEFT-sided weakness beginning yesterday. Intermittent LEFT-sided weakness and slurred speech for 3 months, associated with anxiety. History of seizures. Evaluate RIGHT body weakness. History of diabetes. EXAM: MRI HEAD WITHOUT CONTRAST TECHNIQUE: Multiplanar, multiecho pulse sequences of the brain and surrounding structures were obtained without intravenous contrast. COMPARISON:  CT HEAD October 02, 2015 an MRI head December 20, 2005 FINDINGS: INTRACRANIAL CONTENTS: No reduced diffusion to suggest acute ischemia. No susceptibility artifact to suggest hemorrhage. Mild to moderate ventriculomegaly on the basis of global parenchymal brain volume loss. No suspicious parenchymal signal, masses or mass effect. A  few scattered subcentimeter supratentorial white matter FLAIR T2 hyperintensities are nonspecific. No abnormal extra-axial fluid collections. No extra-axial masses though, contrast enhanced sequences would be more sensitive. Normal major intracranial vascular flow voids present at skull base. Dolicoectatic tortuous LEFT vertebral artery as previously seen. ORBITS: The included ocular globes and orbital contents are non-suspicious. SINUSES: Minimal RIGHT ethmoid mucosal thickening without paranasal sinus air-fluid levels. Mastoid air cells are well aerated. SKULL/SOFT TISSUES: No abnormal sellar expansion. No suspicious calvarial bone marrow signal. Craniocervical junction maintained. IMPRESSION: No acute intracranial process. Mild to moderate global brain parenchymal volume loss for age. Similarly dolicoectatic LEFT vertebral artery. Electronically Signed   By: Elon Alas M.D.   On: 10/03/2015 02:18   Mr Lumbar Spine Wo Contrast  10/03/2015  CLINICAL DATA:   Bilateral leg weakness. EXAM: MRI LUMBAR SPINE WITHOUT CONTRAST TECHNIQUE: Multiplanar, multisequence MR imaging of the lumbar spine was performed. No intravenous contrast was administered. COMPARISON:  MRI 07/04/2014, 07/25/2013 FINDINGS: Segmentation:  Normal segmentation.  Lowest disc space L5-S1 Alignment:  Normal Vertebrae: Normal appearing bone marrow. Negative for fracture or mass lesion. Conus medullaris: Extends to the T12-L1 level and appears normal. Paraspinal and other soft tissues: Paraspinous muscles are normal and symmetric. No retroperitoneal mass or adenopathy. Disc levels: T12-L1:  Negative L1-2: Small extruded disc fragment on the right extending cranially has improved from prior studies. There is possible impingement of the right L1 nerve root as noted previously. No significant spinal stenosis. L2-3: Disc bulging and endplate osteophyte formation. Mild facet degeneration and mild spinal stenosis. Mild foraminal narrowing bilaterally L3-4: Diffuse disc bulging and endplate spurring. Bilateral facet hypertrophy. Mild spinal stenosis. No change from the prior study. Mild subarticular and foraminal stenosis bilaterally. L4-5: Pedicle screw and interbody fusion. Fusion appears solid. Posterior decompression. Negative for spinal or foraminal stenosis L5-S1:  Negative IMPRESSION: Small extruded disc fragment on the right at L1-2 extending cranially has improved from the prior study. There is expected impingement of the right L1 nerve root. Mild spinal stenosis and mild foraminal stenosis bilaterally L2-3 Mild spinal stenosis L3-4 with mild subarticular foraminal stenosis bilaterally Solid fusion L4-5 without stenosis. Electronically Signed   By: Franchot Gallo M.D.   On: 10/03/2015 17:11   US Renal  10/03/2015  CLINICAL DATA:  Acute kidney injury. History of renal stones and hypertension. EXAM: RENAL / URINARY TRACT ULTRASOUND COMPLETE COMPARISON:  CT abdomen and pelvis 06/14/2013 FINDINGS: Right  Kidney: Length: 11.1 cm. Echogenic structure, likely a stone demonstrated in the midpole of the right kidney. No hydronephrosis. No solid mass lesions. Left Kidney: Length: 12 cm. Echogenicity within normal limits. No mass or hydronephrosis visualized. Bladder: No wall thickening or filling defects. Bilateral urine flow jets demonstrated on color flow Doppler imaging. IMPRESSION: Nonobstructing stone in the right kidney.  No hydronephrosis. Electronically Signed   By: Lucienne Capers M.D.   On: 10/03/2015 02:39    Assessment & Plan:   There are no diagnoses linked to this encounter. I am having Mr. Hum maintain his cholecalciferol, multivitamin with minerals, clonazePAM, mirtazapine, divalproex, buPROPion, QUEtiapine, emtricitabine-tenofovir, b complex vitamins, Eszopiclone, atorvastatin, sildenafil, testosterone cypionate, buPROPion, feeding supplement (GLUCERNA SHAKE), polyethylene glycol, senna-docusate, amLODipine, potassium chloride, insulin aspart, and traMADol.  No orders of the defined types were placed in this encounter.     Follow-up: No Follow-up on file.  [name], MD

## 2015-10-12 NOTE — Patient Instructions (Addendum)
Stop Lipitor (Atorvastatin) Take Seroquel XL 400 mg at night

## 2015-10-12 NOTE — Progress Notes (Signed)
Pre visit review using our clinic review tool, if applicable. No additional management support is needed unless otherwise documented below in the visit note. 

## 2015-10-13 LAB — VALPROIC ACID LEVEL: VALPROIC ACID LVL: 88.6 ug/mL (ref 50.0–100.0)

## 2015-10-14 DIAGNOSIS — R270 Ataxia, unspecified: Secondary | ICD-10-CM | POA: Insufficient documentation

## 2015-10-14 DIAGNOSIS — R4781 Slurred speech: Secondary | ICD-10-CM | POA: Insufficient documentation

## 2015-10-14 NOTE — Assessment & Plan Note (Signed)
2017 Stage 3 Labs Will adjust meds

## 2015-10-14 NOTE — Assessment & Plan Note (Signed)
6/17 ?etiology - hold Lipitor Labs

## 2015-10-14 NOTE — Assessment & Plan Note (Signed)
2017 multifactorial: LE weakness,LBP,  OA, meds side effects Hold Lipitor Reduce other meds

## 2015-10-14 NOTE — Assessment & Plan Note (Signed)
?  meds effect Depakote level Reduce meds

## 2015-11-14 ENCOUNTER — Ambulatory Visit: Payer: Self-pay | Admitting: Internal Medicine

## 2015-11-26 ENCOUNTER — Encounter: Payer: Self-pay | Admitting: Internal Medicine

## 2015-11-26 ENCOUNTER — Ambulatory Visit (INDEPENDENT_AMBULATORY_CARE_PROVIDER_SITE_OTHER): Payer: BLUE CROSS/BLUE SHIELD | Admitting: Internal Medicine

## 2015-11-26 DIAGNOSIS — E1165 Type 2 diabetes mellitus with hyperglycemia: Secondary | ICD-10-CM

## 2015-11-26 DIAGNOSIS — F329 Major depressive disorder, single episode, unspecified: Secondary | ICD-10-CM

## 2015-11-26 DIAGNOSIS — M5440 Lumbago with sciatica, unspecified side: Secondary | ICD-10-CM

## 2015-11-26 DIAGNOSIS — E1122 Type 2 diabetes mellitus with diabetic chronic kidney disease: Secondary | ICD-10-CM | POA: Diagnosis not present

## 2015-11-26 DIAGNOSIS — N183 Chronic kidney disease, stage 3 unspecified: Secondary | ICD-10-CM

## 2015-11-26 DIAGNOSIS — I10 Essential (primary) hypertension: Secondary | ICD-10-CM

## 2015-11-26 DIAGNOSIS — R634 Abnormal weight loss: Secondary | ICD-10-CM

## 2015-11-26 DIAGNOSIS — IMO0002 Reserved for concepts with insufficient information to code with codable children: Secondary | ICD-10-CM

## 2015-11-26 DIAGNOSIS — F3175 Bipolar disorder, in partial remission, most recent episode depressed: Secondary | ICD-10-CM | POA: Diagnosis not present

## 2015-11-26 DIAGNOSIS — F32A Depression, unspecified: Secondary | ICD-10-CM

## 2015-11-26 MED ORDER — BUPROPION HCL ER (XL) 150 MG PO TB24: 150.0000 mg | ORAL_TABLET | Freq: Every day | ORAL | 3 refills | Status: DC

## 2015-11-26 MED ORDER — DIVALPROEX SODIUM ER 500 MG PO TB24: 1500.0000 mg | ORAL_TABLET | Freq: Every day | ORAL | 1 refills | Status: DC

## 2015-11-26 MED ORDER — BUPROPION HCL ER (XL) 300 MG PO TB24: 300.0000 mg | ORAL_TABLET | Freq: Every day | ORAL | 3 refills | Status: DC

## 2015-11-26 MED ORDER — TESTOSTERONE CYPIONATE 200 MG/ML IM SOLN: 75.0000 mg | INTRAMUSCULAR | 5 refills | Status: DC

## 2015-11-26 MED ORDER — QUETIAPINE FUMARATE 400 MG PO TABS: 400.0000 mg | ORAL_TABLET | Freq: Every day | ORAL | 1 refills | Status: DC

## 2015-11-26 MED ORDER — CLONAZEPAM 2 MG PO TABS: 2.0000 mg | ORAL_TABLET | Freq: Two times a day (BID) | ORAL | 0 refills | Status: DC | PRN

## 2015-11-26 NOTE — Assessment & Plan Note (Signed)
On Wellbutrin, Remeron

## 2015-11-26 NOTE — Progress Notes (Signed)
Subjective:  Patient ID: MATRIX SCHWIND, male    DOB: [birth year]  Age: 50 y.o. MRN: [id number]  CC: No chief complaint on file.   HPI RAMESH STHILAIRE presents for HTN, CRF, depression, DM f/u  Outpatient Medications Prior to Visit  Medication Sig Dispense Refill  . amLODipine (NORVASC) 5 MG tablet Take 1 tablet (5 mg total) by mouth daily. 30 tablet 11  . atorvastatin (LIPITOR) 10 MG tablet Take 1 tablet (10 mg total) by mouth daily. 90 tablet 3  . b complex vitamins tablet Take 1 tablet by mouth daily. Reported on 07/09/2015    . buPROPion (WELLBUTRIN XL) 150 MG 24 hr tablet Take 150 mg by mouth at bedtime.    Marland Kitchen buPROPion (WELLBUTRIN XL) 300 MG 24 hr tablet Take 1 tablet by mouth daily. 300 mg in the morning and 150 mg in the evening.    . cholecalciferol (VITAMIN D) 1000 UNITS tablet Take 5,000 Units by mouth daily.     . clonazePAM (KLONOPIN) 2 MG tablet Take 2 mg by mouth 2 (two) times daily as needed for anxiety.     . divalproex (DEPAKOTE ER) 500 MG 24 hr tablet Take 1,500 mg by mouth at bedtime.     . Eszopiclone (ESZOPICLONE) 3 MG TABS Take 3 mg by mouth at bedtime. Take immediately before bedtime    . feeding supplement, GLUCERNA SHAKE, (GLUCERNA SHAKE) LIQD Take 237 mLs by mouth daily. 30 Can 0  . insulin aspart (NOVOLOG) 100 UNIT/ML injection Before each meal 3 times a day, 140-199 - 2 units, 200-250 - 4 units, 251-299 - 6 units,  300-349 - 8 units,  350 or above 10 units. Insulin PEN if approved, provide syringes and needles if needed. 10 mL 0  . mirtazapine (REMERON) 45 MG tablet Take 1 tablet (45 mg total) by mouth at bedtime. 30 tablet 5  . Multiple Vitamin (MULTIVITAMIN WITH MINERALS) TABS Take 1 tablet by mouth every morning.    . polyethylene glycol (MIRALAX / GLYCOLAX) packet Take 17 g by mouth daily. 14 each 0  . potassium chloride (K-DUR) 10 MEQ tablet Take 2 tablets (20 mEq total) by mouth daily. 20 tablet 0  . QUEtiapine (SEROQUEL) 400 MG tablet Take 1 tablet (400 mg  total) by mouth at bedtime. 30 tablet 5  . senna-docusate (SENOKOT-S) 8.6-50 MG tablet Take 2 tablets by mouth 2 (two) times daily. 30 tablet 0  . sildenafil (REVATIO) 20 MG tablet 1-5 pills, as needed for ED symptoms 60 tablet 5  . testosterone cypionate (DEPOTESTOSTERONE CYPIONATE) 200 MG/ML injection Inject 75 mg into the muscle once a week.     No facility-administered medications prior to visit.     ROS Review of Systems  Constitutional: Positive for fatigue. Negative for appetite change and unexpected weight change.  HENT: Negative for congestion, nosebleeds, sneezing, sore throat and trouble swallowing.   Eyes: Negative for itching and visual disturbance.  Respiratory: Negative for cough.   Cardiovascular: Negative for chest pain, palpitations and leg swelling.  Gastrointestinal: Negative for abdominal distention, blood in stool, diarrhea and nausea.  Genitourinary: Negative for frequency and hematuria.  Musculoskeletal: Positive for back pain and gait problem. Negative for joint swelling and neck pain.  Skin: Negative for rash.  Neurological: Negative for dizziness, tremors, speech difficulty and weakness.  Psychiatric/Behavioral: Positive for dysphoric mood. Negative for agitation, behavioral problems, sleep disturbance and suicidal ideas. The patient is nervous/anxious.     Objective:  BP (!) 160/100  Pulse 98   Wt 211 lb (95.7 kg)   SpO2 98%   BMI 25.02 kg/m   BP Readings from Last 3 Encounters:  11/26/15 (!) 160/100  10/12/15 120/80  10/05/15 (!) 147/93    Wt Readings from Last 3 Encounters:  11/26/15 211 lb (95.7 kg)  10/12/15 205 lb (93 kg)  10/03/15 215 lb 13.3 oz (97.9 kg)    Physical Exam  Constitutional: He is oriented to person, place, and time. He appears well-developed. No distress.  NAD  HENT:  Mouth/Throat: Oropharynx is clear and moist.  Eyes: Conjunctivae are normal. Pupils are equal, round, and reactive to light.  Neck: Normal range of  motion. No JVD present. No thyromegaly present.  Cardiovascular: Normal rate, regular rhythm, normal heart sounds and intact distal pulses.  Exam reveals no gallop and no friction rub.   No murmur heard. Pulmonary/Chest: Effort normal and breath sounds normal. No respiratory distress. He has no wheezes. He has no rales. He exhibits no tenderness.  Abdominal: Soft. Bowel sounds are normal. He exhibits no distension and no mass. There is no tenderness. There is no rebound and no guarding.  Musculoskeletal: Normal range of motion. He exhibits tenderness. He exhibits no edema.  Lymphadenopathy:    He has no cervical adenopathy.  Neurological: He is alert and oriented to person, place, and time. He has normal reflexes. No cranial nerve deficit. He exhibits normal muscle tone. He displays a negative Romberg sign. Coordination and gait normal.  Skin: Skin is warm and dry. No rash noted.  Psychiatric: His behavior is normal. Judgment and thought content normal.  anxious  Lab Results  Component Value Date   WBC 5.8 10/05/2015   HGB 12.8 (L) 10/05/2015   HCT 39.0 10/05/2015   PLT 91 (L) 10/05/2015   GLUCOSE 130 (H) 10/05/2015   CHOL 90 10/03/2015   TRIG 205 (H) 10/03/2015   HDL 21 (L) 10/03/2015   LDLDIRECT 102.0 04/18/2015   LDLCALC 28 10/03/2015   ALT 33 10/12/2015   AST 40 (H) 10/12/2015   NA 145 10/05/2015   K 3.6 10/05/2015   CL 113 (H) 10/05/2015   CREATININE 2.39 (H) 10/05/2015   BUN 23 (H) 10/05/2015   CO2 25 10/05/2015   TSH 1.55 04/18/2015   PSA 2.46 04/18/2015   INR 1.27 10/02/2015   HGBA1C 4.9 10/03/2015   MICROALBUR 15.8 (H) 04/18/2015    Mr Brain Wo Contrast  Result Date: 10/03/2015 CLINICAL DATA:  LEFT-sided weakness beginning yesterday. Intermittent LEFT-sided weakness and slurred speech for 3 months, associated with anxiety. History of seizures. Evaluate RIGHT body weakness. History of diabetes. EXAM: MRI HEAD WITHOUT CONTRAST TECHNIQUE: Multiplanar, multiecho pulse  sequences of the brain and surrounding structures were obtained without intravenous contrast. COMPARISON:  CT HEAD October 02, 2015 an MRI head December 20, 2005 FINDINGS: INTRACRANIAL CONTENTS: No reduced diffusion to suggest acute ischemia. No susceptibility artifact to suggest hemorrhage. Mild to moderate ventriculomegaly on the basis of global parenchymal brain volume loss. No suspicious parenchymal signal, masses or mass effect. A few scattered subcentimeter supratentorial white matter FLAIR T2 hyperintensities are nonspecific. No abnormal extra-axial fluid collections. No extra-axial masses though, contrast enhanced sequences would be more sensitive. Normal major intracranial vascular flow voids present at skull base. Dolicoectatic tortuous LEFT vertebral artery as previously seen. ORBITS: The included ocular globes and orbital contents are non-suspicious. SINUSES: Minimal RIGHT ethmoid mucosal thickening without paranasal sinus air-fluid levels. Mastoid air cells are well aerated. SKULL/SOFT TISSUES: No abnormal sellar  expansion. No suspicious calvarial bone marrow signal. Craniocervical junction maintained. IMPRESSION: No acute intracranial process. Mild to moderate global brain parenchymal volume loss for age. Similarly dolicoectatic LEFT vertebral artery. Electronically Signed   By: Elon Alas M.D.   On: 10/03/2015 02:18   Mr Lumbar Spine Wo Contrast  Result Date: 10/03/2015 CLINICAL DATA:  Bilateral leg weakness. EXAM: MRI LUMBAR SPINE WITHOUT CONTRAST TECHNIQUE: Multiplanar, multisequence MR imaging of the lumbar spine was performed. No intravenous contrast was administered. COMPARISON:  MRI 07/04/2014, 07/25/2013 FINDINGS: Segmentation:  Normal segmentation.  Lowest disc space L5-S1 Alignment:  Normal Vertebrae: Normal appearing bone marrow. Negative for fracture or mass lesion. Conus medullaris: Extends to the T12-L1 level and appears normal. Paraspinal and other soft tissues: Paraspinous  muscles are normal and symmetric. No retroperitoneal mass or adenopathy. Disc levels: T12-L1:  Negative L1-2: Small extruded disc fragment on the right extending cranially has improved from prior studies. There is possible impingement of the right L1 nerve root as noted previously. No significant spinal stenosis. L2-3: Disc bulging and endplate osteophyte formation. Mild facet degeneration and mild spinal stenosis. Mild foraminal narrowing bilaterally L3-4: Diffuse disc bulging and endplate spurring. Bilateral facet hypertrophy. Mild spinal stenosis. No change from the prior study. Mild subarticular and foraminal stenosis bilaterally. L4-5: Pedicle screw and interbody fusion. Fusion appears solid. Posterior decompression. Negative for spinal or foraminal stenosis L5-S1:  Negative IMPRESSION: Small extruded disc fragment on the right at L1-2 extending cranially has improved from the prior study. There is expected impingement of the right L1 nerve root. Mild spinal stenosis and mild foraminal stenosis bilaterally L2-3 Mild spinal stenosis L3-4 with mild subarticular foraminal stenosis bilaterally Solid fusion L4-5 without stenosis. Electronically Signed   By: Franchot Gallo M.D.   On: 10/03/2015 17:11   US Renal  Result Date: 10/03/2015 CLINICAL DATA:  Acute kidney injury. History of renal stones and hypertension. EXAM: RENAL / URINARY TRACT ULTRASOUND COMPLETE COMPARISON:  CT abdomen and pelvis 06/14/2013 FINDINGS: Right Kidney: Length: 11.1 cm. Echogenic structure, likely a stone demonstrated in the midpole of the right kidney. No hydronephrosis. No solid mass lesions. Left Kidney: Length: 12 cm. Echogenicity within normal limits. No mass or hydronephrosis visualized. Bladder: No wall thickening or filling defects. Bilateral urine flow jets demonstrated on color flow Doppler imaging. IMPRESSION: Nonobstructing stone in the right kidney.  No hydronephrosis. Electronically Signed   By: Lucienne Capers M.D.   On:  10/03/2015 02:39    Assessment & Plan:   There are no diagnoses linked to this encounter. I am having Mr. Iser maintain his cholecalciferol, multivitamin with minerals, clonazePAM, mirtazapine, divalproex, buPROPion, b complex vitamins, Eszopiclone, atorvastatin, sildenafil, testosterone cypionate, buPROPion, feeding supplement (GLUCERNA SHAKE), polyethylene glycol, senna-docusate, potassium chloride, insulin aspart, amLODipine, QUEtiapine, and TRUVADA.  Meds ordered this encounter  Medications  . TRUVADA 200-300 MG tablet    Sig: Take 1 tablet by mouth daily.    Refill:  9     Follow-up: No Follow-up on file.  [name], MD

## 2015-11-26 NOTE — Progress Notes (Signed)
Pre visit review using our clinic review tool, if applicable. No additional management support is needed unless otherwise documented below in the visit note. 

## 2015-11-26 NOTE — Assessment & Plan Note (Signed)
[name] needs to see a nephrologist

## 2015-11-26 NOTE — Assessment & Plan Note (Signed)
Losartan HCT 

## 2015-11-26 NOTE — Assessment & Plan Note (Signed)
Bipolar depression, chronic

## 2015-11-26 NOTE — Assessment & Plan Note (Signed)
Wt Readings from Last 3 Encounters:  11/26/15 211 lb (95.7 kg)  10/12/15 205 lb (93 kg)  10/03/15 215 lb 13.3 oz (97.9 kg)

## 2015-11-26 NOTE — Assessment & Plan Note (Addendum)
Checking CBG at home On Novolog

## 2015-11-28 ENCOUNTER — Telehealth: Payer: Self-pay

## 2015-11-28 NOTE — Telephone Encounter (Signed)
PA initiated via CoverMyMeds key [id number]

## 2015-11-28 NOTE — Telephone Encounter (Signed)
PA was originally DENIED 06/13/2015, pt was advised of same at that time

## 2015-12-06 ENCOUNTER — Encounter: Payer: Self-pay | Admitting: Internal Medicine

## 2015-12-06 ENCOUNTER — Other Ambulatory Visit: Payer: Self-pay | Admitting: Internal Medicine

## 2015-12-06 MED ORDER — MIRTAZAPINE 45 MG PO TABS: 45.0000 mg | ORAL_TABLET | Freq: Every day | ORAL | 11 refills | Status: DC

## 2015-12-11 ENCOUNTER — Encounter: Payer: Self-pay | Admitting: Internal Medicine

## 2015-12-11 NOTE — Telephone Encounter (Signed)
Rec fax stating PA is approved 06/11/2015 until 04/20/2038.

## 2015-12-13 ENCOUNTER — Encounter: Payer: Self-pay | Admitting: Internal Medicine

## 2015-12-13 ENCOUNTER — Other Ambulatory Visit: Payer: Self-pay | Admitting: Internal Medicine

## 2015-12-20 ENCOUNTER — Encounter: Payer: Self-pay | Admitting: *Deleted

## 2015-12-21 ENCOUNTER — Encounter: Payer: Self-pay | Admitting: Internal Medicine

## 2015-12-25 ENCOUNTER — Other Ambulatory Visit: Payer: Self-pay | Admitting: Internal Medicine

## 2015-12-25 ENCOUNTER — Encounter (HOSPITAL_COMMUNITY): Payer: Self-pay | Admitting: Emergency Medicine

## 2015-12-25 ENCOUNTER — Inpatient Hospital Stay (HOSPITAL_COMMUNITY)
Admission: EM | Admit: 2015-12-25 | Discharge: 2016-01-01 | DRG: 918 | Disposition: A | Discharge status: Other Acute Inpatient Hospital | Payer: BLUE CROSS/BLUE SHIELD | Attending: Family Medicine | Admitting: Family Medicine

## 2015-12-25 ENCOUNTER — Emergency Department (HOSPITAL_COMMUNITY): Payer: BLUE CROSS/BLUE SHIELD

## 2015-12-25 DIAGNOSIS — Z808 Family history of malignant neoplasm of other organs or systems: Secondary | ICD-10-CM

## 2015-12-25 DIAGNOSIS — T50911A Poisoning by multiple unspecified drugs, medicaments and biological substances, accidental (unintentional), initial encounter: Secondary | ICD-10-CM | POA: Diagnosis present

## 2015-12-25 DIAGNOSIS — T424X2A Poisoning by benzodiazepines, intentional self-harm, initial encounter: Principal | ICD-10-CM

## 2015-12-25 DIAGNOSIS — D649 Anemia, unspecified: Secondary | ICD-10-CM | POA: Diagnosis present

## 2015-12-25 DIAGNOSIS — I10 Essential (primary) hypertension: Secondary | ICD-10-CM | POA: Diagnosis present

## 2015-12-25 DIAGNOSIS — T50902A Poisoning by unspecified drugs, medicaments and biological substances, intentional self-harm, initial encounter: Secondary | ICD-10-CM | POA: Diagnosis present

## 2015-12-25 DIAGNOSIS — T391X2A Poisoning by 4-Aminophenol derivatives, intentional self-harm, initial encounter: Secondary | ICD-10-CM | POA: Diagnosis present

## 2015-12-25 DIAGNOSIS — F4310 Post-traumatic stress disorder, unspecified: Secondary | ICD-10-CM | POA: Diagnosis present

## 2015-12-25 DIAGNOSIS — Z794 Long term (current) use of insulin: Secondary | ICD-10-CM

## 2015-12-25 DIAGNOSIS — I129 Hypertensive chronic kidney disease with stage 1 through stage 4 chronic kidney disease, or unspecified chronic kidney disease: Secondary | ICD-10-CM | POA: Diagnosis present

## 2015-12-25 DIAGNOSIS — D696 Thrombocytopenia, unspecified: Secondary | ICD-10-CM | POA: Diagnosis present

## 2015-12-25 DIAGNOSIS — T50912A Poisoning by multiple unspecified drugs, medicaments and biological substances, intentional self-harm, initial encounter: Secondary | ICD-10-CM | POA: Diagnosis present

## 2015-12-25 DIAGNOSIS — N189 Chronic kidney disease, unspecified: Secondary | ICD-10-CM | POA: Diagnosis present

## 2015-12-25 DIAGNOSIS — T404X2A Poisoning by other synthetic narcotics, intentional self-harm, initial encounter: Secondary | ICD-10-CM | POA: Diagnosis present

## 2015-12-25 DIAGNOSIS — R4182 Altered mental status, unspecified: Secondary | ICD-10-CM | POA: Diagnosis not present

## 2015-12-25 DIAGNOSIS — N183 Chronic kidney disease, stage 3 (moderate): Secondary | ICD-10-CM | POA: Diagnosis present

## 2015-12-25 DIAGNOSIS — E1122 Type 2 diabetes mellitus with diabetic chronic kidney disease: Secondary | ICD-10-CM | POA: Diagnosis present

## 2015-12-25 DIAGNOSIS — Z8049 Family history of malignant neoplasm of other genital organs: Secondary | ICD-10-CM

## 2015-12-25 DIAGNOSIS — T1491XA Suicide attempt, initial encounter: Secondary | ICD-10-CM

## 2015-12-25 DIAGNOSIS — Z87442 Personal history of urinary calculi: Secondary | ICD-10-CM

## 2015-12-25 DIAGNOSIS — Z833 Family history of diabetes mellitus: Secondary | ICD-10-CM

## 2015-12-25 DIAGNOSIS — F319 Bipolar disorder, unspecified: Secondary | ICD-10-CM | POA: Diagnosis present

## 2015-12-25 DIAGNOSIS — Z87891 Personal history of nicotine dependence: Secondary | ICD-10-CM

## 2015-12-25 DIAGNOSIS — E1165 Type 2 diabetes mellitus with hyperglycemia: Secondary | ICD-10-CM | POA: Diagnosis present

## 2015-12-25 DIAGNOSIS — R339 Retention of urine, unspecified: Secondary | ICD-10-CM | POA: Diagnosis present

## 2015-12-25 DIAGNOSIS — Z96643 Presence of artificial hip joint, bilateral: Secondary | ICD-10-CM | POA: Diagnosis present

## 2015-12-25 LAB — COMPREHENSIVE METABOLIC PANEL
ALT: 47 U/L (ref 17–63)
AST: 50 U/L — ABNORMAL HIGH (ref 15–41)
Albumin: 3.6 g/dL (ref 3.5–5.0)
Alkaline Phosphatase: 75 U/L (ref 38–126)
Anion gap: 9 (ref 5–15)
BILIRUBIN TOTAL: 0.8 mg/dL (ref 0.3–1.2)
BUN: 26 mg/dL — AB (ref 6–20)
CALCIUM: 8.8 mg/dL — AB (ref 8.9–10.3)
CHLORIDE: 102 mmol/L (ref 101–111)
CO2: 25 mmol/L (ref 22–32)
CREATININE: 3.02 mg/dL — AB (ref 0.61–1.24)
GFR, EST AFRICAN AMERICAN: 27 mL/min — AB (ref >60)
GFR, EST NON AFRICAN AMERICAN: 23 mL/min — AB (ref >60)
Glucose, Bld: 131 mg/dL — ABNORMAL HIGH (ref 65–99)
Potassium: 3.2 mmol/L — ABNORMAL LOW (ref 3.5–5.1)
Sodium: 136 mmol/L (ref 135–145)
TOTAL PROTEIN: 6.9 g/dL (ref 6.5–8.1)

## 2015-12-25 LAB — CBC WITH DIFFERENTIAL/PLATELET
BASOS ABS: 0 10*3/uL (ref 0.0–0.1)
BASOS PCT: 0 %
EOS PCT: 0 %
Eosinophils Absolute: 0 10*3/uL (ref 0.0–0.7)
HEMATOCRIT: 36.8 % — AB (ref 39.0–52.0)
Hemoglobin: 12.7 g/dL — ABNORMAL LOW (ref 13.0–17.0)
LYMPHS PCT: 12 %
Lymphs Abs: 0.9 10*3/uL (ref 0.7–4.0)
MCH: 34 pg (ref 26.0–34.0)
MCHC: 34.5 g/dL (ref 30.0–36.0)
MCV: 98.7 fL (ref 78.0–100.0)
MONO ABS: 1 10*3/uL (ref 0.1–1.0)
MONOS PCT: 12 %
Neutro Abs: 6.2 10*3/uL (ref 1.7–7.7)
Neutrophils Relative %: 76 %
PLATELETS: 76 10*3/uL — AB (ref 150–400)
RBC: 3.73 MIL/uL — ABNORMAL LOW (ref 4.22–5.81)
RDW: 13.8 % (ref 11.5–15.5)
SMEAR REVIEW: DECREASED
WBC: 8.2 10*3/uL (ref 4.0–10.5)

## 2015-12-25 LAB — BLOOD GAS, ARTERIAL
ALLENS TEST (PASS/FAIL): POSITIVE — AB
Acid-base deficit: 1.6 mmol/L (ref 0.0–2.0)
BICARBONATE: 22.7 mmol/L (ref 20.0–28.0)
Drawn by: 382351
FIO2: 28
O2 Saturation: 96.8 %
PCO2 ART: 45.1 mmHg (ref 32.0–48.0)
PH ART: 7.335 — AB (ref 7.350–7.450)
PO2 ART: 96.3 mmHg (ref 83.0–108.0)
Patient temperature: 37

## 2015-12-25 LAB — ETHANOL

## 2015-12-25 LAB — ACETAMINOPHEN LEVEL

## 2015-12-25 LAB — SALICYLATE LEVEL: Salicylate Lvl: <4 mg/dL (ref 2.8–30.0)

## 2015-12-25 MED ORDER — NALOXONE HCL 0.4 MG/ML IJ SOLN
0.4000 mg | Freq: Once | INTRAMUSCULAR | Status: AC
  Administered 2015-12-25: 0.4 mg via INTRAVENOUS
  Filled 2015-12-25: qty 1

## 2015-12-25 MED ORDER — SODIUM CHLORIDE 0.9 % IV BOLUS (SEPSIS)
1000.0000 mL | Freq: Once | INTRAVENOUS | Status: AC
  Administered 2015-12-25: 1000 mL via INTRAVENOUS

## 2015-12-25 NOTE — ED Notes (Signed)
Per Malachy Mood with poison control repeat ekg in 4-6 hours, give pt iv fluids and tylenol level drawn 4 hours from ingestion. Pt is to be kept overnight for observation.

## 2015-12-25 NOTE — ED Provider Notes (Signed)
[facility] DEPT Provider Note   CSN: [id number] Arrival date & time: 12/25/15  2231  By signing my name below, I, Gwenlyn Fudge, attest that this documentation has been prepared under the direction and in the presence of Rolland Porter, MD. Electronically Signed: Gwenlyn Fudge, ED Scribe. 12/25/15. 12:33 AM.  Time Seen 23:10 PM   History   Chief Complaint Chief Complaint  Patient presents with  . Drug Overdose   LEVEL 5 CAVEAT DUE TO ALTERED MENTAL STATUS  The history is provided by the patient. No language interpreter was used.    HPI Comments: [name] is a 50 y.o. male with PMHx of CKD, DM, Depression, HTN, and PTSD who presents to the Emergency Department presenting with a drug overdose of Clonazepam at 7 PM tonight. Pt states he took extra pills in attempt to hurt himself. Pt states he has attempted to harm himself before. Per triage note, pt was found in his vehicle and took 180 Clonazepam as well as Seroquel. Patient is very sleepy, when he tries to talk his speech is unintelligible. I tried to just ask him yes or no questions.  PCP Dr [name]   Past Medical History:  Diagnosis Date  . ADD (attention deficit disorder)   . Anxiety   . Arthritis    right hip  . Bipolar 1 disorder ([place])   . Blood in urine   . CKD (chronic kidney disease), stage III   . Creatinine elevation   . Depression    bipolar [facility]  . Diabetes mellitus without complication ([place])   . Family history of anesthesia complication    pt is unsure , but pt father may have been difficult to arouse   . History of kidney stones   . Hypertension   . Hypogonadism male   . Liver fatty degeneration   . Microscopic hematuria    hereditary s/p Urology eval  . Pneumonia 10-2012  . PTSD (post-traumatic stress disorder)    SOCIAL ANXIETY DISORDER     Patient Active Problem List   Diagnosis Date Noted  . Suicidal overdose ([place]) 12/26/2015  . Ataxia 10/14/2015  . Slurring of speech 10/14/2015    . Leg weakness 10/03/2015  . CRF (chronic renal failure) 10/03/2015  . Hypokalemia 10/03/2015  . Hemiparesis ([place])   . Seizures ([place])   . Primary osteoarthritis of left hip 05/22/2015  . Exposure to potentially hazardous body fluids 02/12/2015  . Loss of weight 09/04/2014  . Dyslipidemia 05/31/2014  . Left hip pain 02/13/2014  . Insomnia 11/11/2013  . Degenerative joint disease (DJD) of hip 08/16/2013  . Acute respiratory failure ([place]) 11/02/2012  . Pneumonia, organism unspecified 11/02/2012  . Pleural effusion 11/02/2012  . Hypoxemia 11/02/2012  . HCAP (healthcare-associated pneumonia) 10/31/2012  . Anemia 10/31/2012  . Osteoarthritis of right hip 11/28/2011  . Right hip pain 07/22/2011  . Bilateral hip pain 07/22/2011  . LBP (low back pain) 05/20/2011  . Testicular pain, right 05/20/2011  . Erectile dysfunction 02/17/2011  . Constipation - functional 02/17/2011  . Diabetes type 2, uncontrolled ([place]) 11/06/2010  . Hypogonadism male 05/20/2010  . TOBACCO USER 05/20/2010  . Demoralization and apathy 05/20/2010  . Bipolar disorder ([place]) 01/18/2010  . Depression 01/18/2010  . MICROSCOPIC HEMATURIA 01/18/2010  . SHOULDER PAIN 01/18/2010  . Anxiety state 12/05/2006  . Essential hypertension 12/05/2006    Past Surgical History:  Procedure Laterality Date  . BACK SURGERY    . CLOSED REDUCTION METACARPAL WITH PERCUTANEOUS PINNING Right   .  LUMBAR DISC SURGERY    . TONSILLECTOMY    . TOTAL HIP ARTHROPLASTY Right 08/16/2013   Procedure: TOTAL HIP ARTHROPLASTY ANTERIOR APPROACH;  Surgeon: Hessie Dibble, MD;  Location: [facility];  Service: Orthopedics;  Laterality: Right;  . TOTAL HIP ARTHROPLASTY Left 05/22/2015   Procedure: TOTAL HIP ARTHROPLASTY ANTERIOR APPROACH;  Surgeon: Melrose Nakayama, MD;  Location: [facility];  Service: Orthopedics;  Laterality: Left;      Home Medications    Prior to Admission medications   Medication Sig Start Date End Date Taking? Authorizing Provider   amLODipine (NORVASC) 5 MG tablet Take 1 tablet (5 mg total) by mouth daily. 10/12/15   Evie Lacks Plotnikov, MD  atorvastatin (LIPITOR) 10 MG tablet Take 1 tablet (10 mg total) by mouth daily. 05/29/15   Evie Lacks Plotnikov, MD  b complex vitamins tablet Take 1 tablet by mouth daily. Reported on 07/09/2015    Historical Provider, MD  buPROPion (WELLBUTRIN XL) 150 MG 24 hr tablet Take 1 tablet (150 mg total) by mouth at bedtime. 11/26/15   Evie Lacks Plotnikov, MD  buPROPion (WELLBUTRIN XL) 300 MG 24 hr tablet Take 1 tablet (300 mg total) by mouth daily. 300 mg in the morning and 150 mg in the evening. 11/26/15   [name], MD  cholecalciferol (VITAMIN D) 1000 UNITS tablet Take 5,000 Units by mouth daily.     Historical Provider, MD  clonazePAM (KLONOPIN) 2 MG tablet Take 1 tablet (2 mg total) by mouth 2 (two) times daily as needed for anxiety. 11/26/15   Evie Lacks Plotnikov, MD  divalproex (DEPAKOTE ER) 500 MG 24 hr tablet Take 3 tablets (1,500 mg total) by mouth at bedtime. 11/26/15   Evie Lacks Plotnikov, MD  Eszopiclone (ESZOPICLONE) 3 MG TABS Take 3 mg by mouth at bedtime. Take immediately before bedtime    Historical Provider, MD  feeding supplement, GLUCERNA SHAKE, (GLUCERNA SHAKE) LIQD Take 237 mLs by mouth daily. 10/05/15   Florencia Reasons, MD  insulin aspart (NOVOLOG) 100 UNIT/ML injection Before each meal 3 times a day, 140-199 - 2 units, 200-250 - 4 units, 251-299 - 6 units,  300-349 - 8 units,  350 or above 10 units. Insulin PEN if approved, provide syringes and needles if needed. 10/05/15   Florencia Reasons, MD  mirtazapine (REMERON) 45 MG tablet Take 1 tablet (45 mg total) by mouth at bedtime. 12/06/15   [name], MD  Multiple Vitamin (MULTIVITAMIN WITH MINERALS) TABS Take 1 tablet by mouth every morning.    Historical Provider, MD  polyethylene glycol (MIRALAX / GLYCOLAX) packet Take 17 g by mouth daily. 10/05/15   Florencia Reasons, MD  QUEtiapine (SEROQUEL) 400 MG tablet Take 1 tablet (400 mg total) by  mouth at bedtime. 11/26/15   Evie Lacks Plotnikov, MD  senna-docusate (SENOKOT-S) 8.6-50 MG tablet Take 2 tablets by mouth 2 (two) times daily. 10/05/15   Florencia Reasons, MD  sildenafil (REVATIO) 20 MG tablet 1-5 pills, as needed for ED symptoms 07/09/15   Renato Shin, MD  testosterone cypionate (DEPOTESTOSTERONE CYPIONATE) 200 MG/ML injection Inject 0.38 mLs (76 mg total) into the muscle once a week. 11/26/15   [name], MD    Family History Family History  Problem Relation Age of Onset  . Diabetes Father   . Cancer Mother     died of melanoma with mets  . Cervical cancer Sister   . Diabetes Sister   . Other Neg Hx     hypogonadism  Social History Social History  Substance Use Topics  . Smoking status: Former Smoker    Years: 0.00    Types: Cigarettes    Quit date: 02/19/2014  . Smokeless tobacco: Never Used  . Alcohol use No     Allergies   Vicodin [hydrocodone-acetaminophen]   Review of Systems Review of Systems  Unable to perform ROS: Mental status change  Constitutional: Negative for fever.  Psychiatric/Behavioral: Positive for self-injury and suicidal ideas.   Physical Exam Updated Vital Signs BP 122/88   Pulse 80   Resp 10   Ht 6\' 3"  (1.905 m)   Wt 211 lb (95.7 kg)   SpO2 93%   BMI 26.37 kg/m   Vital signs normal    Physical Exam  Constitutional: He appears well-developed and well-nourished.  Non-toxic appearance. He does not appear ill. No distress.  pt is lethargic and sleeping Easily awakened, but falls back asleep  HENT:  Head: Normocephalic and atraumatic.  Right Ear: External ear normal.  Left Ear: External ear normal.  Nose: Nose normal. No mucosal edema or rhinorrhea.  Mouth/Throat: Oropharynx is clear and moist and mucous membranes are normal. No dental abscesses or uvula swelling.  Eyes: Conjunctivae and EOM are normal. Pupils are equal, round, and reactive to light.  Neck: Normal range of motion and full passive range of motion without  pain. Neck supple.  Cardiovascular: Normal rate, regular rhythm and normal heart sounds.  Exam reveals no gallop and no friction rub.   No murmur heard. Pulmonary/Chest: Effort normal and breath sounds normal. No respiratory distress. He has no wheezes. He has no rhonchi. He has no rales. He exhibits no tenderness and no crepitus.  Abdominal: Soft. Normal appearance and bowel sounds are normal. He exhibits no distension. There is no tenderness. There is no rebound and no guarding.  Musculoskeletal: Normal range of motion. He exhibits no edema or tenderness.  Moves all extremities well.   Neurological: He has normal strength. No cranial nerve deficit.  Skin: Skin is warm, dry and intact. No rash noted. No erythema. No pallor.  Psychiatric: His speech is delayed and slurred. He is slowed.  Nursing note and vitals reviewed.      ED Treatments / Results  DIAGNOSTIC STUDIES: Oxygen Saturation is 93% on RA, low by my interpretation.    Labs (all labs ordered are listed, but only abnormal results are displayed) Results for orders placed or performed during the hospital encounter of 12/25/15  CBC with Differential  Result Value Ref Range   WBC 8.2 4.0 - 10.5 K/uL   RBC 3.73 (L) 4.22 - 5.81 MIL/uL   Hemoglobin 12.7 (L) 13.0 - 17.0 g/dL   HCT 36.8 (L) 39.0 - 52.0 %   MCV 98.7 78.0 - 100.0 fL   MCH 34.0 26.0 - 34.0 pg   MCHC 34.5 30.0 - 36.0 g/dL   RDW 13.8 11.5 - 15.5 %   Platelets 76 (L) 150 - 400 K/uL   Neutrophils Relative % 76 %   Neutro Abs 6.2 1.7 - 7.7 K/uL   Lymphocytes Relative 12 %   Lymphs Abs 0.9 0.7 - 4.0 K/uL   Monocytes Relative 12 %   Monocytes Absolute 1.0 0.1 - 1.0 K/uL   Eosinophils Relative 0 %   Eosinophils Absolute 0.0 0.0 - 0.7 K/uL   Basophils Relative 0 %   Basophils Absolute 0.0 0.0 - 0.1 K/uL   WBC Morphology ATYPICAL LYMPHOCYTES    RBC Morphology POLYCHROMASIA PRESENT  Smear Review PLATELETS APPEAR DECREASED   Comprehensive metabolic panel  Result  Value Ref Range   Sodium 136 135 - 145 mmol/L   Potassium 3.2 (L) 3.5 - 5.1 mmol/L   Chloride 102 101 - 111 mmol/L   CO2 25 22 - 32 mmol/L   Glucose, Bld 131 (H) 65 - 99 mg/dL   BUN 26 (H) 6 - 20 mg/dL   Creatinine, Ser 3.02 (H) 0.61 - 1.24 mg/dL   Calcium 8.8 (L) 8.9 - 10.3 mg/dL   Total Protein 6.9 6.5 - 8.1 g/dL   Albumin 3.6 3.5 - 5.0 g/dL   AST 50 (H) 15 - 41 U/L   ALT 47 17 - 63 U/L   Alkaline Phosphatase 75 38 - 126 U/L   Total Bilirubin 0.8 0.3 - 1.2 mg/dL   GFR calc non Af Amer 23 (L) >60 mL/min   GFR calc Af Amer 27 (L) >60 mL/min   Anion gap 9 5 - 15  Ethanol  Result Value Ref Range   Alcohol, Ethyl (B) <5 <5 mg/dL  Urine rapid drug screen (hosp performed)  Result Value Ref Range   Opiates NONE DETECTED NONE DETECTED   Cocaine NONE DETECTED NONE DETECTED   Benzodiazepines POSITIVE (A) NONE DETECTED   Amphetamines NONE DETECTED NONE DETECTED   Tetrahydrocannabinol NONE DETECTED NONE DETECTED   Barbiturates NONE DETECTED NONE DETECTED  Acetaminophen level  Result Value Ref Range   Acetaminophen (Tylenol), Serum <10 (L) 10 - 30 ug/mL  Salicylate level  Result Value Ref Range   Salicylate Lvl 123456 2.8 - 30.0 mg/dL  Blood gas, arterial  Result Value Ref Range   FIO2 28.00    Delivery systems NASAL CANNULA    pH, Arterial 7.335 (L) 7.350 - 7.450   pCO2 arterial 45.1 32.0 - 48.0 mmHg   pO2, Arterial 96.3 83.0 - 108.0 mmHg   Bicarbonate 22.7 20.0 - 28.0 mmol/L   Acid-base deficit 1.6 0.0 - 2.0 mmol/L   O2 Saturation 96.8 %   Patient temperature 37.0    Collection site RIGHT RADIAL    Drawn by [id number]    Sample type ARTERIAL DRAW    Allens test (pass/fail) POSITIVE (A) PASS   Laboratory interpretation all normal except +UDS, hypokalemia, mild anemia, stable renal insufficiency    EKG  EKG Interpretation  Date/Time:  Tuesday December 25 2015 22:38:03 EDT Ventricular Rate:  82 PR Interval:    QRS Duration: 102 QT Interval:  335 QTC Calculation: 392 R  Axis:   56 Text Interpretation:  Sinus rhythm Probable LVH with secondary repol abnrm Since last tracing rate slower 11 May 2015 Confirmed by Kasee Hantz  MD-I, Brielle Moro ([id number]) on 12/25/2015 11:19:42 PM        EKG Interpretation  Date/Time:  Wednesday December 26 2015 04:44:02 EDT Ventricular Rate:  66 PR Interval:    QRS Duration: 103 QT Interval:  437 QTC Calculation: 458 R Axis:   60 Text Interpretation:  Sinus rhythm Minimal ST depression, diffuse leads No significant change since last tracing HOURS EARLIER Confirmed by Kamaiya Antilla  MD-I, Davonne Jarnigan ([id number]) on 12/26/2015 5:05:13 AM        Radiology Dg Chest Portable 1 View  Result Date: 12/25/2015 CLINICAL DATA:  Overdose, Klonopin and Seroquel. Altered mental status. EXAM: PORTABLE CHEST 1 VIEW COMPARISON:  05/11/2015 FINDINGS: Low lung volumes are present, causing crowding of the pulmonary vasculature. The patient is rotated to the left on today's radiograph, reducing diagnostic sensitivity and specificity. Accounting for the very  low lung volumes, the vague perihilar densities are ascribed to vasculature. There may be a small component of basilar and perihilar atelectasis. No definite signs of aspiration pneumonitis although surveillance may be warranted. Thoracic spondylosis. Heart size within normal limits for projection. IMPRESSION: 1. Very low lung volumes. No definite airspace opacity aside from some mild perihilar and basilar atelectasis. Electronically Signed   By: Van Clines M.D.   On: 12/25/2015 23:38    Procedures Procedures (including critical care time)  Medications Ordered in ED Medications  sodium chloride 0.9 % bolus 1,000 mL (0 mLs Intravenous Stopped 12/26/15 0324)  naloxone (NARCAN) injection 0.4 mg (0.4 mg Intravenous Given 12/25/15 2253)     Initial Impression / Assessment and Plan / ED Course  I have reviewed the triage vital signs and the nursing notes.  Pertinent labs & imaging results that were available during my care  of the patient were reviewed by me and considered in my medical decision making (see chart for details).  Clinical Course   11:19 PM Pt seen in room.   Patient has remained lethargic during his ED visit. Poison control was recontacted about 4:45 AM. They recommended a repeat EKG and medical admission since he has been observed for at least 6 hours and he is not getting more alert.  Review of his chart shows he was seen by Dr. [name]  in August.  05:11 AM Dr Shanon Brow, will see patient for admission  Final Clinical Impressions(s) / ED Diagnoses   Final diagnoses:  Overdose of benzodiazepine, intentional self-harm, initial encounter ([place])  Altered mental status, unspecified altered mental status type  Suicide attempt [facility])    Plan medical admission   Rolland Porter, MD, FACEP  I personally performed the services described in this documentation, which was scribed in my presence. The recorded information has been reviewed and considered.  Rolland Porter, MD, Barbette Or, MD 12/26/15 [phone]

## 2015-12-25 NOTE — ED Triage Notes (Signed)
Pt brought in by EMS, found in his vehicle with the windows down. When pt is aroused he states he took 180 clonazepam. Pt speech slurred. Pt took meds at 1900. Pt states he also took Seroquel.

## 2015-12-25 NOTE — ED Notes (Signed)
Nurse tech attempted in and out cath with no success. I attempted coude catheter with no success. I then used 8 french feeding tube that was successful for in and out cath.

## 2015-12-26 DIAGNOSIS — T50902S Poisoning by unspecified drugs, medicaments and biological substances, intentional self-harm, sequela: Secondary | ICD-10-CM

## 2015-12-26 DIAGNOSIS — I10 Essential (primary) hypertension: Secondary | ICD-10-CM

## 2015-12-26 DIAGNOSIS — T50912A Poisoning by multiple unspecified drugs, medicaments and biological substances, intentional self-harm, initial encounter: Secondary | ICD-10-CM

## 2015-12-26 DIAGNOSIS — R4182 Altered mental status, unspecified: Secondary | ICD-10-CM | POA: Diagnosis not present

## 2015-12-26 DIAGNOSIS — T50902A Poisoning by unspecified drugs, medicaments and biological substances, intentional self-harm, initial encounter: Secondary | ICD-10-CM | POA: Diagnosis present

## 2015-12-26 HISTORY — DX: Poisoning by multiple unspecified drugs, medicaments and biological substances, intentional self-harm, initial encounter: T50.912A

## 2015-12-26 LAB — CBC
HCT: 36.1 % — ABNORMAL LOW (ref 39.0–52.0)
Hemoglobin: 12.3 g/dL — ABNORMAL LOW (ref 13.0–17.0)
MCH: 34.1 pg — AB (ref 26.0–34.0)
MCHC: 34.1 g/dL (ref 30.0–36.0)
MCV: 100 fL (ref 78.0–100.0)
PLATELETS: 72 10*3/uL — AB (ref 150–400)
RBC: 3.61 MIL/uL — ABNORMAL LOW (ref 4.22–5.81)
RDW: 14 % (ref 11.5–15.5)
WBC: 6.7 10*3/uL (ref 4.0–10.5)

## 2015-12-26 LAB — RAPID URINE DRUG SCREEN, HOSP PERFORMED
Amphetamines: NOT DETECTED
BARBITURATES: NOT DETECTED
Benzodiazepines: POSITIVE — AB
Cocaine: NOT DETECTED
Opiates: NOT DETECTED
TETRAHYDROCANNABINOL: NOT DETECTED

## 2015-12-26 LAB — CREATININE, SERUM
Creatinine, Ser: 2.76 mg/dL — ABNORMAL HIGH (ref 0.61–1.24)
GFR calc non Af Amer: 26 mL/min — ABNORMAL LOW (ref >60)
GFR, EST AFRICAN AMERICAN: 30 mL/min — AB (ref >60)

## 2015-12-26 LAB — MRSA PCR SCREENING: MRSA by PCR: NEGATIVE

## 2015-12-26 MED ORDER — NALOXONE HCL 0.4 MG/ML IJ SOLN
INTRAMUSCULAR | Status: AC
  Administered 2015-12-26: 0.4 mg via INTRAVENOUS
  Filled 2015-12-26: qty 1

## 2015-12-26 MED ORDER — POTASSIUM CHLORIDE IN NACL 20-0.9 MEQ/L-% IV SOLN
INTRAVENOUS | Status: DC
  Administered 2015-12-26 – 2015-12-28 (×5): via INTRAVENOUS
  Administered 2015-12-28: 1000 mL via INTRAVENOUS
  Administered 2015-12-29: 16:00:00 via INTRAVENOUS

## 2015-12-26 MED ORDER — ENOXAPARIN SODIUM 40 MG/0.4ML ~~LOC~~ SOLN
40.0000 mg | SUBCUTANEOUS | Status: DC
  Administered 2015-12-27 – 2016-01-01 (×6): 40 mg via SUBCUTANEOUS
  Filled 2015-12-26 (×6): qty 0.4

## 2015-12-26 MED ORDER — NALOXONE HCL 0.4 MG/ML IJ SOLN
0.4000 mg | Freq: Once | INTRAMUSCULAR | Status: AC
  Administered 2015-12-26: 0.4 mg via INTRAVENOUS
  Filled 2015-12-26: qty 1

## 2015-12-26 MED ORDER — NALOXONE HCL 2 MG/2ML IJ SOSY
PREFILLED_SYRINGE | INTRAMUSCULAR | Status: AC
Start: 2015-12-26 — End: 2015-12-26
  Filled 2015-12-26: qty 2

## 2015-12-26 MED ORDER — NALOXONE HCL 0.4 MG/ML IJ SOLN
0.4000 mg | Freq: Once | INTRAMUSCULAR | Status: AC
  Administered 2015-12-26: 0.4 mg via INTRAVENOUS

## 2015-12-26 MED ORDER — ENOXAPARIN SODIUM 30 MG/0.3ML ~~LOC~~ SOLN
30.0000 mg | SUBCUTANEOUS | Status: DC
  Administered 2015-12-26: 30 mg via SUBCUTANEOUS
  Filled 2015-12-26: qty 0.3

## 2015-12-26 NOTE — Care Management Note (Signed)
Case Management Note  Patient Details  Name: [name] MRN: [id number] Date of Birth: Jun 26, 1965  Subjective/Objective:                  Pt admitted with intentional OD. Anticipate pt will need inpt psych. Will need to be cleared medically before assessment can be completed.   Action/Plan: Will cont to follow.   Expected Discharge Date:     12/30/2015             [facility]  In-House Referral:  Clinical Social Work  Discharge planning Services  CM Consult  Post Acute Care Choice:  NA Choice offered to:  NA  DME Arranged:    DME Agency:     HH Arranged:    HH Agency:     Status of Service:  In process, will continue to follow  If discussed at Long Length of Stay Meetings, dates discussed:    Additional Comments:  Sherald Barge, RN 12/26/2015, 3:09 PM

## 2015-12-26 NOTE — Progress Notes (Signed)
Family into see patient.  Just found out that he was admitted to hospital.  Explained that I am not able to give out information and that we have to follow SI precaution.  Proper SI precaution initiated family allowed to visit for short duration.

## 2015-12-26 NOTE — ED Notes (Signed)
Patient remains unresponsive. Patient does have facial grimace with painful stimulation. Patient repositioned and warm blanket given at this time.

## 2015-12-26 NOTE — ED Notes (Signed)
Patient responsive to verbal stimulation and can cough on command

## 2015-12-26 NOTE — H&P (Signed)
History and Physical    [name] [id number] DOB: 1965-11-12 DOA: 12/25/2015  PCP: [name], MD  Patient coming from:  His car  Chief Complaint:   Found after drug overdose  HPI: [name] is a 50 y.o. male with medical history significant of bipolar, DM, PTSD, CKD was found sitting in his car right after ingesting over a 100 clonazepam pills (2mg  each), tramadol about 10 pills, and a couple of vicodin.  His story has changed and been inconsistent so dont really know accurately what he took.  He was given narcan on arrival with minimal response.  Over the last 5 hours in the ED he has become more and more drowsy.  He responds to my loud voice, is able to produce a strong cough although with much coaching and is answering questions however unsure the accuracy of the answers.  Pt says he was trying to kill himself.  Pt referred for admission for multiple drug overdose.  Review of Systems: unobtainable due to ams  Past Medical History:  Diagnosis Date  . ADD (attention deficit disorder)   . Anxiety   . Arthritis    right hip  . Bipolar 1 disorder ([place])   . Blood in urine   . CKD (chronic kidney disease), stage III   . Creatinine elevation   . Depression    bipolar [facility]  . Diabetes mellitus without complication ([place])   . Family history of anesthesia complication    pt is unsure , but pt father may have been difficult to arouse   . History of kidney stones   . Hypertension   . Hypogonadism male   . Liver fatty degeneration   . Microscopic hematuria    hereditary s/p Urology eval  . Pneumonia 10-2012  . PTSD (post-traumatic stress disorder)    SOCIAL ANXIETY DISORDER     Past Surgical History:  Procedure Laterality Date  . BACK SURGERY    . CLOSED REDUCTION METACARPAL WITH PERCUTANEOUS PINNING Right   . LUMBAR DISC SURGERY    . TONSILLECTOMY    . TOTAL HIP ARTHROPLASTY Right 08/16/2013   Procedure: TOTAL HIP ARTHROPLASTY ANTERIOR APPROACH;  Surgeon:  Hessie Dibble, MD;  Location: [facility];  Service: Orthopedics;  Laterality: Right;  . TOTAL HIP ARTHROPLASTY Left 05/22/2015   Procedure: TOTAL HIP ARTHROPLASTY ANTERIOR APPROACH;  Surgeon: Melrose Nakayama, MD;  Location: [facility];  Service: Orthopedics;  Laterality: Left;     reports that he quit smoking about 22 months ago. His smoking use included Cigarettes. He quit after 0.00 years of use. He has never used smokeless tobacco. He reports that he does not drink alcohol or use drugs.  Allergies  Allergen Reactions  . Vicodin [Hydrocodone-Acetaminophen] Itching    Family History  Problem Relation Age of Onset  . Diabetes Father   . Cancer Mother     died of melanoma with mets  . Cervical cancer Sister   . Diabetes Sister   . Other Neg Hx     hypogonadism    Prior to Admission medications   Medication Sig Start Date End Date Taking? Authorizing Provider  amLODipine (NORVASC) 5 MG tablet Take 1 tablet (5 mg total) by mouth daily. 10/12/15   Evie Lacks Plotnikov, MD  atorvastatin (LIPITOR) 10 MG tablet Take 1 tablet (10 mg total) by mouth daily. 05/29/15   Evie Lacks Plotnikov, MD  b complex vitamins tablet Take 1 tablet by mouth daily. Reported on 07/09/2015  Historical Provider, MD  buPROPion (WELLBUTRIN XL) 150 MG 24 hr tablet Take 1 tablet (150 mg total) by mouth at bedtime. 11/26/15   Evie Lacks Plotnikov, MD  buPROPion (WELLBUTRIN XL) 300 MG 24 hr tablet Take 1 tablet (300 mg total) by mouth daily. 300 mg in the morning and 150 mg in the evening. 11/26/15   [name], MD  cholecalciferol (VITAMIN D) 1000 UNITS tablet Take 5,000 Units by mouth daily.     Historical Provider, MD  clonazePAM (KLONOPIN) 2 MG tablet Take 1 tablet (2 mg total) by mouth 2 (two) times daily as needed for anxiety. 11/26/15   Evie Lacks Plotnikov, MD  divalproex (DEPAKOTE ER) 500 MG 24 hr tablet Take 3 tablets (1,500 mg total) by mouth at bedtime. 11/26/15   Evie Lacks Plotnikov, MD  Eszopiclone (ESZOPICLONE) 3 MG  TABS Take 3 mg by mouth at bedtime. Take immediately before bedtime    Historical Provider, MD  feeding supplement, GLUCERNA SHAKE, (GLUCERNA SHAKE) LIQD Take 237 mLs by mouth daily. 10/05/15   Florencia Reasons, MD  insulin aspart (NOVOLOG) 100 UNIT/ML injection Before each meal 3 times a day, 140-199 - 2 units, 200-250 - 4 units, 251-299 - 6 units,  300-349 - 8 units,  350 or above 10 units. Insulin PEN if approved, provide syringes and needles if needed. 10/05/15   Florencia Reasons, MD  mirtazapine (REMERON) 45 MG tablet Take 1 tablet (45 mg total) by mouth at bedtime. 12/06/15   [name], MD  Multiple Vitamin (MULTIVITAMIN WITH MINERALS) TABS Take 1 tablet by mouth every morning.    Historical Provider, MD  polyethylene glycol (MIRALAX / GLYCOLAX) packet Take 17 g by mouth daily. 10/05/15   Florencia Reasons, MD  QUEtiapine (SEROQUEL) 400 MG tablet Take 1 tablet (400 mg total) by mouth at bedtime. 11/26/15   Evie Lacks Plotnikov, MD  senna-docusate (SENOKOT-S) 8.6-50 MG tablet Take 2 tablets by mouth 2 (two) times daily. 10/05/15   Florencia Reasons, MD  sildenafil (REVATIO) 20 MG tablet 1-5 pills, as needed for ED symptoms 07/09/15   Renato Shin, MD  testosterone cypionate (DEPOTESTOSTERONE CYPIONATE) 200 MG/ML injection Inject 0.38 mLs (76 mg total) into the muscle once a week. 11/26/15   [name], MD    Physical Exam: Vitals:   12/26/15 0400 12/26/15 0500 12/26/15 0500 12/26/15 0530  BP: 101/79 115/83 115/83 127/86  Pulse: 66 71 71 71  Resp: (!) 7 (!) 9 12 13   Temp:   (!) 95.8 F (35.4 C)   TempSrc:   Rectal   SpO2: 98% 96% 96% 96%  Weight:      Height:        Constitutional: NAD, calm, comfortable Vitals:   12/26/15 0400 12/26/15 0500 12/26/15 0500 12/26/15 0530  BP: 101/79 115/83 115/83 127/86  Pulse: 66 71 71 71  Resp: (!) 7 (!) 9 12 13   Temp:   (!) 95.8 F (35.4 C)   TempSrc:   Rectal   SpO2: 98% 96% 96% 96%  Weight:      Height:       Eyes: PERRL, lids and conjunctivae normal, arouses to  voice, moderately sedated but able to clear his own secretions at this time ENMT: Mucous membranes are moist. Posterior pharynx clear of any exudate or lesions.Normal dentition.  Neck: normal, supple, no masses, no thyromegaly Respiratory: clear to auscultation bilaterally, no wheezing, no crackles. Normal respiratory effort. No accessory muscle use.  Cardiovascular: Regular rate and rhythm, no  murmurs / rubs / gallops. No extremity edema. 2+ pedal pulses. No carotid bruits.  Abdomen: no tenderness, no masses palpated. No hepatosplenomegaly. Bowel sounds positive.  Musculoskeletal: no clubbing / cyanosis. No joint deformity upper and lower extremities. Good ROM, no contractures. Normal muscle tone.  Skin: no rashes, lesions, ulcers. No induration Neurologic: CN 2-12 grossly intact. Sensation intact, DTR normal. Strength 5/5 in all 4.  Psychiatric:. intoxicated   Labs on Admission: I have personally reviewed following labs and imaging studies  CBC:  Recent Labs Lab 12/25/15 2254  WBC 8.2  NEUTROABS 6.2  HGB 12.7*  HCT 36.8*  MCV 98.7  PLT 76*   Basic Metabolic Panel:  Recent Labs Lab 12/25/15 2254  NA 136  K 3.2*  CL 102  CO2 25  GLUCOSE 131*  BUN 26*  CREATININE 3.02*  CALCIUM 8.8*   GFR: Estimated Creatinine Clearance: 35.8 mL/min (by C-G formula based on SCr of 3.02 mg/dL). Liver Function Tests:  Recent Labs Lab 12/25/15 2254  AST 50*  ALT 47  ALKPHOS 75  BILITOT 0.8  PROT 6.9  ALBUMIN 3.6   Urine analysis:    Component Value Date/Time   COLORURINE YELLOW 10/03/2015 1427   APPEARANCEUR CLEAR 10/03/2015 1427   LABSPEC 1.012 10/03/2015 1427   PHURINE 7.5 10/03/2015 1427   GLUCOSEU 500 (A) 10/03/2015 1427   GLUCOSEU >=1000 (A) 04/18/2015 1516   HGBUR TRACE (A) 10/03/2015 1427   BILIRUBINUR NEGATIVE 10/03/2015 1427   KETONESUR NEGATIVE 10/03/2015 1427   PROTEINUR 30 (A) 10/03/2015 1427   UROBILINOGEN 0.2 04/18/2015 1516   NITRITE NEGATIVE 10/03/2015  1427   LEUKOCYTESUR NEGATIVE 10/03/2015 1427   Radiological Exams on Admission: Dg Chest Portable 1 View  Result Date: 12/25/2015 CLINICAL DATA:  Overdose, Klonopin and Seroquel. Altered mental status. EXAM: PORTABLE CHEST 1 VIEW COMPARISON:  05/11/2015 FINDINGS: Low lung volumes are present, causing crowding of the pulmonary vasculature. The patient is rotated to the left on today's radiograph, reducing diagnostic sensitivity and specificity. Accounting for the very low lung volumes, the vague perihilar densities are ascribed to vasculature. There may be a small component of basilar and perihilar atelectasis. No definite signs of aspiration pneumonitis although surveillance may be warranted. Thoracic spondylosis. Heart size within normal limits for projection. IMPRESSION: 1. Very low lung volumes. No definite airspace opacity aside from some mild perihilar and basilar atelectasis. Electronically Signed   By: Van Clines M.D.   On: 12/25/2015 23:38    Assessment/Plan 50 yo male with intentional polysubstance overdose  Principal Problem:   Suicidal overdose ([place])- gave another dose of narcan and had some response.  Think we can hold off on intubating at this time, but will need to be monitored closely for worsening respiratory status through the day.  Place in stepdown unit.  Prn narcan ordered, however I think most of the effects right now are from benzos.  Will need psych eval and inpatient psych care once medically cleared.  Active Problems:   Essential hypertension   Diabetes type 2, uncontrolled ([place])-  q 4 hour glucose checks ordered   CRF (chronic renal failure)   Suicide attempt by multiple drug overdose ([place])     DVT prophylaxis:  Scds, lovenox  Code Status:   full Family Communication:  none Disposition Plan:   Per day team Consults called:  none Admission status:  obs   Jase Himmelberger A MD [facility]  If 7PM-7AM, please contact  night-coverage [email or web address] Password [email or web address]  12/26/2015, 5:45 AM

## 2015-12-26 NOTE — ED Notes (Signed)
Spoke with Shanon Brow with poison control, states patient should be admitted for observation

## 2015-12-26 NOTE — Progress Notes (Signed)
Patient unable to void bladder scanned patient showed over 262ml.  Informed MD order for in and out cath.  Unable to cath with 9f used 48f with no issues emptied 1400 out of bladder.  Patient states he feels much better. Will continue to monitor patient.

## 2015-12-27 DIAGNOSIS — T50911A Poisoning by multiple unspecified drugs, medicaments and biological substances, accidental (unintentional), initial encounter: Secondary | ICD-10-CM | POA: Diagnosis present

## 2015-12-27 DIAGNOSIS — Z833 Family history of diabetes mellitus: Secondary | ICD-10-CM | POA: Diagnosis not present

## 2015-12-27 DIAGNOSIS — T50902S Poisoning by unspecified drugs, medicaments and biological substances, intentional self-harm, sequela: Secondary | ICD-10-CM | POA: Diagnosis not present

## 2015-12-27 DIAGNOSIS — R4182 Altered mental status, unspecified: Secondary | ICD-10-CM | POA: Diagnosis present

## 2015-12-27 DIAGNOSIS — N183 Chronic kidney disease, stage 3 (moderate): Secondary | ICD-10-CM

## 2015-12-27 DIAGNOSIS — T50902A Poisoning by unspecified drugs, medicaments and biological substances, intentional self-harm, initial encounter: Secondary | ICD-10-CM

## 2015-12-27 DIAGNOSIS — D696 Thrombocytopenia, unspecified: Secondary | ICD-10-CM | POA: Diagnosis present

## 2015-12-27 DIAGNOSIS — R404 Transient alteration of awareness: Secondary | ICD-10-CM | POA: Diagnosis not present

## 2015-12-27 DIAGNOSIS — E1122 Type 2 diabetes mellitus with diabetic chronic kidney disease: Secondary | ICD-10-CM | POA: Diagnosis present

## 2015-12-27 DIAGNOSIS — T404X2A Poisoning by other synthetic narcotics, intentional self-harm, initial encounter: Secondary | ICD-10-CM | POA: Diagnosis present

## 2015-12-27 DIAGNOSIS — Z87442 Personal history of urinary calculi: Secondary | ICD-10-CM | POA: Diagnosis not present

## 2015-12-27 DIAGNOSIS — I10 Essential (primary) hypertension: Secondary | ICD-10-CM | POA: Diagnosis not present

## 2015-12-27 DIAGNOSIS — I129 Hypertensive chronic kidney disease with stage 1 through stage 4 chronic kidney disease, or unspecified chronic kidney disease: Secondary | ICD-10-CM | POA: Diagnosis present

## 2015-12-27 DIAGNOSIS — T391X2A Poisoning by 4-Aminophenol derivatives, intentional self-harm, initial encounter: Secondary | ICD-10-CM | POA: Diagnosis present

## 2015-12-27 DIAGNOSIS — Z794 Long term (current) use of insulin: Secondary | ICD-10-CM | POA: Diagnosis not present

## 2015-12-27 DIAGNOSIS — R339 Retention of urine, unspecified: Secondary | ICD-10-CM | POA: Diagnosis present

## 2015-12-27 DIAGNOSIS — Z8049 Family history of malignant neoplasm of other genital organs: Secondary | ICD-10-CM | POA: Diagnosis not present

## 2015-12-27 DIAGNOSIS — Z87891 Personal history of nicotine dependence: Secondary | ICD-10-CM | POA: Diagnosis not present

## 2015-12-27 DIAGNOSIS — F4310 Post-traumatic stress disorder, unspecified: Secondary | ICD-10-CM | POA: Diagnosis present

## 2015-12-27 DIAGNOSIS — D649 Anemia, unspecified: Secondary | ICD-10-CM | POA: Diagnosis present

## 2015-12-27 DIAGNOSIS — E1165 Type 2 diabetes mellitus with hyperglycemia: Secondary | ICD-10-CM

## 2015-12-27 DIAGNOSIS — Z96643 Presence of artificial hip joint, bilateral: Secondary | ICD-10-CM | POA: Diagnosis present

## 2015-12-27 DIAGNOSIS — T424X2A Poisoning by benzodiazepines, intentional self-harm, initial encounter: Secondary | ICD-10-CM | POA: Diagnosis present

## 2015-12-27 DIAGNOSIS — Z808 Family history of malignant neoplasm of other organs or systems: Secondary | ICD-10-CM | POA: Diagnosis not present

## 2015-12-27 DIAGNOSIS — F319 Bipolar disorder, unspecified: Secondary | ICD-10-CM | POA: Diagnosis present

## 2015-12-27 LAB — CBC
HEMATOCRIT: 33.8 % — AB (ref 39.0–52.0)
HEMOGLOBIN: 11.2 g/dL — AB (ref 13.0–17.0)
MCH: 34.7 pg — AB (ref 26.0–34.0)
MCHC: 33.1 g/dL (ref 30.0–36.0)
MCV: 104.6 fL — AB (ref 78.0–100.0)
Platelets: 69 10*3/uL — ABNORMAL LOW (ref 150–400)
RBC: 3.23 MIL/uL — ABNORMAL LOW (ref 4.22–5.81)
RDW: 13.7 % (ref 11.5–15.5)
WBC: 6.6 10*3/uL (ref 4.0–10.5)

## 2015-12-27 LAB — COMPREHENSIVE METABOLIC PANEL
ALK PHOS: 59 U/L (ref 38–126)
ALT: 35 U/L (ref 17–63)
ANION GAP: 6 (ref 5–15)
AST: 36 U/L (ref 15–41)
Albumin: 3 g/dL — ABNORMAL LOW (ref 3.5–5.0)
BILIRUBIN TOTAL: 0.9 mg/dL (ref 0.3–1.2)
BUN: 32 mg/dL — ABNORMAL HIGH (ref 6–20)
CALCIUM: 8.6 mg/dL — AB (ref 8.9–10.3)
CO2: 23 mmol/L (ref 22–32)
Chloride: 111 mmol/L (ref 101–111)
Creatinine, Ser: 2.73 mg/dL — ABNORMAL HIGH (ref 0.61–1.24)
GFR, EST AFRICAN AMERICAN: 30 mL/min — AB (ref >60)
GFR, EST NON AFRICAN AMERICAN: 26 mL/min — AB (ref >60)
Glucose, Bld: 107 mg/dL — ABNORMAL HIGH (ref 65–99)
POTASSIUM: 4.2 mmol/L (ref 3.5–5.1)
Sodium: 140 mmol/L (ref 135–145)
TOTAL PROTEIN: 6.2 g/dL — AB (ref 6.5–8.1)

## 2015-12-27 MED ORDER — IPRATROPIUM-ALBUTEROL 0.5-2.5 (3) MG/3ML IN SOLN
3.0000 mL | Freq: Three times a day (TID) | RESPIRATORY_TRACT | Status: DC
  Administered 2015-12-28: 3 mL via RESPIRATORY_TRACT
  Filled 2015-12-27: qty 3

## 2015-12-27 MED ORDER — IPRATROPIUM-ALBUTEROL 0.5-2.5 (3) MG/3ML IN SOLN
3.0000 mL | Freq: Four times a day (QID) | RESPIRATORY_TRACT | Status: DC
  Administered 2015-12-27 (×4): 3 mL via RESPIRATORY_TRACT
  Filled 2015-12-27 (×3): qty 3

## 2015-12-27 NOTE — Progress Notes (Signed)
PROGRESS NOTE    [name]  XVQ:[id number] DOB: Jan 25, 1966 DOA: 12/25/2015 PCP: [name], MD    Brief Narrative:  [name] is a 50 y.o. male with medical history significant of bipolar, DM, PTSD, CKD was found sitting in his car right after ingesting over a 100 clonazepam pills (2mg  each), tramadol about 10 pills, and a couple of vicodin.  His story has changed and been inconsistent so dont really know accurately what he took.  He was given narcan on arrival with minimal response.  Over the last 5 hours in the ED he has become more and more drowsy.  He responds to my loud voice, is able to produce a strong cough although with much coaching and is answering questions however unsure the accuracy of the answers.  Pt says he was trying to kill himself.  Pt referred for admission for multiple drug overdose.   Assessment & Plan:   Principal Problem:   Suicidal overdose ([place]) Active Problems:   Essential hypertension   Diabetes type 2, uncontrolled ([facility])   CRF (chronic renal failure)   Suicide attempt by multiple drug overdose ([place])   Suicidal overdose ([place]) - Received 2 doses of narcan and had some response - Maintaining airway at this time (no need for intubation) - Stepdown unit - Psychiatry consultation placed - Mentation still not full cleared and still requiring supplemental oxygen - continue to monitor closely - will need to pass swallow eval prior to changing diet from NPO - keep NPO at this time  Chronic Kidney Disease (CKD) - Cr Improved with hydration - Baseline creatinine of ~2.7 - Continue IVF at this time  Diabetes Type 2, uncontrolled - CBG - no need for ISS at this time but may need to add slide when patient IVF or diet is changed    DVT prophylaxis: SCD's, lovenox Code Status: Full Family Communication: No family bedside, patient did not give permission to call family Disposition Plan: Patient mentation not cleared and he is still requiring  supplemental oxygen, will require at least 1 additional day to ensure patient is medically stable for discharge.  Will consult psychiatry today to help get patient to inpatient facility   Consultants:   Psychiatry consulted today  Procedures:   none  Antimicrobials:   none    Subjective: Patient awake upon examination today but seems sleepy.  Voices that he knows why is here ("Depression").  He is oriented to person, somewhat to place (states he is in the hospital but believes this is [facility]), but not oriented to place (believes this is 1997 and could not name September, or what day of the week it was).  He mentions that his life has been getting worse recently and that it did not seem to be getting any better.  No chest pain, shortness of breath, increased work of breathing, no nausea, no diarrhea.    Objective: Vitals:   12/27/15 0400 12/27/15 0500 12/27/15 0756 12/27/15 0823  BP:      Pulse:      Resp:      Temp: 97.7 F (36.5 C)  100.3 F (37.9 C)   TempSrc: Oral  Oral   SpO2:    96%  Weight:  95.6 kg (210 lb 12.2 oz)    Height:        Intake/Output Summary (Last 24 hours) at 12/27/15 0900 Last data filed at 12/27/15 0932  Gross per 24 hour  Intake  2438.33 ml  Output             3050 ml  Net          -611.67 ml   Filed Weights   12/25/15 2242 12/26/15 0643 12/27/15 0500  Weight: 95.7 kg (211 lb) 95.9 kg (211 lb 6.7 oz) 95.6 kg (210 lb 12.2 oz)    Examination:  General exam: Appears calm and comfortable  Respiratory system: Clear to auscultation. Respiratory effort normal. Cardiovascular system: S1 & S2 heard, RRR. No JVD, murmurs, rubs, gallops or clicks. No pedal edema. Gastrointestinal system: Abdomen is nondistended, soft and nontender. No organomegaly or masses felt. Normal bowel sounds heard. Central nervous system: Alert and oriented x 2 (person and somewhat to place- not specific place). No focal neurological deficits. Extremities:  Symmetric 5 x 5 power. Neuro: CN II-XII grossly intact, sensation intact, patient able to move upper and lower extremities bilaterally without concern, no asterixus, but tremor on finger to nose Skin: No rashes, lesions or ulcers Psychiatry: seems sad but also is sleepy during examination     Data Reviewed: I have personally reviewed following labs and imaging studies  CBC:  Recent Labs Lab 12/25/15 2254 12/26/15 0812 12/27/15 0356  WBC 8.2 6.7 6.6  NEUTROABS 6.2  --   --   HGB 12.7* 12.3* 11.2*  HCT 36.8* 36.1* 33.8*  MCV 98.7 100.0 104.6*  PLT 76* 72* 69*   Basic Metabolic Panel:  Recent Labs Lab 12/25/15 2254 12/26/15 0812 12/27/15 0356  NA 136  --  140  K 3.2*  --  4.2  CL 102  --  111  CO2 25  --  23  GLUCOSE 131*  --  107*  BUN 26*  --  32*  CREATININE 3.02* 2.76* 2.73*  CALCIUM 8.8*  --  8.6*   GFR: Estimated Creatinine Clearance: 39.6 mL/min (by C-G formula based on SCr of 2.73 mg/dL). Liver Function Tests:  Recent Labs Lab 12/25/15 2254 12/27/15 0356  AST 50* 36  ALT 47 35  ALKPHOS 75 59  BILITOT 0.8 0.9  PROT 6.9 6.2*  ALBUMIN 3.6 3.0*   No results for input(s): LIPASE, AMYLASE in the last 168 hours. No results for input(s): AMMONIA in the last 168 hours. Coagulation Profile: No results for input(s): INR, PROTIME in the last 168 hours. Cardiac Enzymes: No results for input(s): CKTOTAL, CKMB, CKMBINDEX, TROPONINI in the last 168 hours. BNP (last 3 results) No results for input(s): PROBNP in the last 8760 hours. HbA1C: No results for input(s): HGBA1C in the last 72 hours. CBG: No results for input(s): GLUCAP in the last 168 hours. Lipid Profile: No results for input(s): CHOL, HDL, LDLCALC, TRIG, CHOLHDL, LDLDIRECT in the last 72 hours. Thyroid Function Tests: No results for input(s): TSH, T4TOTAL, FREET4, T3FREE, THYROIDAB in the last 72 hours. Anemia Panel: No results for input(s): VITAMINB12, FOLATE, FERRITIN, TIBC, IRON, RETICCTPCT in  the last 72 hours. Sepsis Labs: No results for input(s): PROCALCITON, LATICACIDVEN in the last 168 hours.  Recent Results (from the past 240 hour(s))  MRSA PCR Screening     Status: None   Collection Time: 12/26/15  6:43 AM  Result Value Ref Range Status   MRSA by PCR NEGATIVE NEGATIVE Final    Comment:        The GeneXpert MRSA Assay (FDA approved for NASAL specimens only), is one component of a comprehensive MRSA colonization surveillance program. It is not intended to diagnose MRSA infection nor to guide or  monitor treatment for MRSA infections.          Radiology Studies: Dg Chest Portable 1 View  Result Date: 12/25/2015 CLINICAL DATA:  Overdose, Klonopin and Seroquel. Altered mental status. EXAM: PORTABLE CHEST 1 VIEW COMPARISON:  05/11/2015 FINDINGS: Low lung volumes are present, causing crowding of the pulmonary vasculature. The patient is rotated to the left on today's radiograph, reducing diagnostic sensitivity and specificity. Accounting for the very low lung volumes, the vague perihilar densities are ascribed to vasculature. There may be a small component of basilar and perihilar atelectasis. No definite signs of aspiration pneumonitis although surveillance may be warranted. Thoracic spondylosis. Heart size within normal limits for projection. IMPRESSION: 1. Very low lung volumes. No definite airspace opacity aside from some mild perihilar and basilar atelectasis. Electronically Signed   By: Van Clines M.D.   On: 12/25/2015 23:38        Scheduled Meds: . enoxaparin (LOVENOX) injection  40 mg Subcutaneous Q24H  . ipratropium-albuterol  3 mL Nebulization Q6H   Continuous Infusions: . 0.9 % NaCl with KCl 20 mEq / L 100 mL/hr at 12/27/15 0823     LOS: 0 days    Time spent: 35 minutes    Newman Pies, MD [facility] Pager [phone]  If 7PM-7AM, please contact night-coverage [email or web address] Password [email or web address] 12/27/2015, 9:00 AM

## 2015-12-28 ENCOUNTER — Ambulatory Visit: Payer: Self-pay | Admitting: Internal Medicine

## 2015-12-28 DIAGNOSIS — R4182 Altered mental status, unspecified: Secondary | ICD-10-CM

## 2015-12-28 LAB — CBC WITH DIFFERENTIAL/PLATELET
BASOS PCT: 0 %
Basophils Absolute: 0 10*3/uL (ref 0.0–0.1)
EOS ABS: 0 10*3/uL (ref 0.0–0.7)
EOS PCT: 0 %
HCT: 32.7 % — ABNORMAL LOW (ref 39.0–52.0)
Hemoglobin: 10.9 g/dL — ABNORMAL LOW (ref 13.0–17.0)
LYMPHS ABS: 0.8 10*3/uL (ref 0.7–4.0)
Lymphocytes Relative: 16 %
MCH: 34 pg (ref 26.0–34.0)
MCHC: 33.3 g/dL (ref 30.0–36.0)
MCV: 101.9 fL — ABNORMAL HIGH (ref 78.0–100.0)
MONOS PCT: 16 %
Monocytes Absolute: 0.8 10*3/uL (ref 0.1–1.0)
NEUTROS PCT: 68 %
Neutro Abs: 3.6 10*3/uL (ref 1.7–7.7)
PLATELETS: 79 10*3/uL — AB (ref 150–400)
RBC: 3.21 MIL/uL — ABNORMAL LOW (ref 4.22–5.81)
RDW: 13.8 % (ref 11.5–15.5)
WBC: 5.3 10*3/uL (ref 4.0–10.5)

## 2015-12-28 MED ORDER — IPRATROPIUM-ALBUTEROL 0.5-2.5 (3) MG/3ML IN SOLN: 3.0000 mL | Freq: Four times a day (QID) | RESPIRATORY_TRACT | Status: DC | PRN

## 2015-12-28 NOTE — Evaluation (Signed)
Clinical/Bedside Swallow Evaluation Patient Details  Name: [name] MRN: [id number] Date of Birth: Apr 07, 1966  Today's Date: 12/28/2015 Time: SLP Start Time (ACUTE ONLY): 0762 SLP Stop Time (ACUTE ONLY): 2633 SLP Time Calculation (min) (ACUTE ONLY): 30 min  Past Medical History:  Past Medical History:  Diagnosis Date  . ADD (attention deficit disorder)   . Anxiety   . Arthritis    right hip  . Bipolar 1 disorder ([place])   . Blood in urine   . CKD (chronic kidney disease), stage III   . Creatinine elevation   . Depression    bipolar [facility]  . Diabetes mellitus without complication ([place])   . Family history of anesthesia complication    pt is unsure , but pt father may have been difficult to arouse   . History of kidney stones   . Hypertension   . Hypogonadism male   . Liver fatty degeneration   . Microscopic hematuria    hereditary s/p Urology eval  . Pneumonia 10-2012  . PTSD (post-traumatic stress disorder)    SOCIAL ANXIETY DISORDER    Past Surgical History:  Past Surgical History:  Procedure Laterality Date  . BACK SURGERY    . CLOSED REDUCTION METACARPAL WITH PERCUTANEOUS PINNING Right   . LUMBAR DISC SURGERY    . TONSILLECTOMY    . TOTAL HIP ARTHROPLASTY Right 08/16/2013   Procedure: TOTAL HIP ARTHROPLASTY ANTERIOR APPROACH;  Surgeon: Hessie Dibble, MD;  Location: [facility];  Service: Orthopedics;  Laterality: Right;  . TOTAL HIP ARTHROPLASTY Left 05/22/2015   Procedure: TOTAL HIP ARTHROPLASTY ANTERIOR APPROACH;  Surgeon: Melrose Nakayama, MD;  Location: [facility];  Service: Orthopedics;  Laterality: Left;   HPI:  [name] a 50 y.o.malewith medical history significant of bipolar, DM, PTSD, CKD was found sitting in his car right after ingesting over a 100 clonazepam pills (2mg  each), tramadol about 10 pills, and a couple of vicodin. His story has changed and been inconsistent so dont really know accurately what he took. He was given narcan on arrival with  minimal response. Over the last 5 hours in the ED he has become more and more drowsy   Assessment / Plan / Recommendation Clinical Impression  Pt presents with moderate oropharyngeal dysphagia which is exacerbated by decreased mentation. Pt lethargic yet stimulabe for PO trials following oral care. Pt with overt signs and symptoms of reduced ariway protection this date following all PO trials (ice chips, thin, nectar thick, and puree) including immediate and delayed coughing, throat clearing, wet vocal quality, and changes in respiration. Reflexive cough noted to be weak and congested. Concern of signficant aspiration risk. Given symptomatic BSE and reduced cognitive status recommend continue NPO at this time with medicines via alternative means until mentation improves. ST to closely monitor for PO readiness.    Aspiration Risk  Severe aspiration risk    Diet Recommendation   NPO  Medication Administration: Via alternative means    Other  Recommendations Oral Care Recommendations: Oral care QID   Follow up Recommendations  24 hour supervision/assistance;Skilled Nursing facility    Frequency and Duration min 2x/week  1 week       Prognosis Prognosis for Safe Diet Advancement: Good Barriers to Reach Goals: Behavior;Time post onset;Motivation      Swallow Study   General Date of Onset: 12/25/15 HPI: DIMETRI Gray a 50 y.o.malewith medical history significant of bipolar, DM, PTSD, CKD was found sitting in his car right after ingesting over  a 100 clonazepam pills (2mg  each), tramadol about 10 pills, and a couple of vicodin. His story has changed and been inconsistent so dont really know accurately what he took. He was given narcan on arrival with minimal response. Over the last 5 hours in the ED he has become more and more drowsy Type of Study: Bedside Swallow Evaluation Previous Swallow Assessment: none on file Diet Prior to this Study: NPO Temperature Spikes Noted:  No Respiratory Status: Room air History of Recent Intubation: No Behavior/Cognition: Confused;Lethargic/Drowsy Oral Cavity Assessment: Dry Oral Care Completed by SLP: Yes Oral Cavity - Dentition: Adequate natural dentition Self-Feeding Abilities: Total assist Patient Positioning: Upright in bed Baseline Vocal Quality: Wet;Low vocal intensity Volitional Cough: Congested;Weak Volitional Swallow: Able to elicit    Oral/Motor/Sensory Function Overall Oral Motor/Sensory Function: Generalized oral weakness   Ice Chips Ice chips: Impaired Presentation: Spoon Oral Phase Impairments: Reduced labial seal;Reduced lingual movement/coordination Oral Phase Functional Implications: Prolonged oral transit Pharyngeal Phase Impairments: Suspected delayed Swallow;Multiple swallows;Throat Clearing - Delayed;Throat Clearing - Immediate;Cough - Immediate;Cough - Delayed;Wet Vocal Quality   Thin Liquid Thin Liquid: Impaired Presentation: Cup Oral Phase Impairments: Reduced labial seal;Reduced lingual movement/coordination Oral Phase Functional Implications: Prolonged oral transit;Right anterior spillage;Left anterior spillage Pharyngeal  Phase Impairments: Suspected delayed Swallow;Multiple swallows;Wet Vocal Quality;Cough - Delayed;Change in Vital Signs    Nectar Thick Nectar Thick Liquid: Impaired Presentation: Spoon;Cup Oral Phase Impairments: Reduced lingual movement/coordination Oral phase functional implications: Prolonged oral transit Pharyngeal Phase Impairments: Suspected delayed Swallow;Multiple swallows;Cough - Immediate;Cough - Delayed;Wet Vocal Quality;Throat Clearing - Immediate;Throat Clearing - Delayed   Honey Thick Honey Thick Liquid: Not tested   Puree Puree: Impaired Presentation: Spoon Oral Phase Impairments: Reduced lingual movement/coordination Oral Phase Functional Implications: Prolonged oral transit Pharyngeal Phase Impairments: Suspected delayed Swallow;Multiple swallows;Wet  Vocal Quality   Solid   GO   Solid: Not tested       Arvil Chaco MA, CCC-SLP Acute Care Speech Language Pathologist    Arvil Chaco E 12/28/2015,4:51 PM

## 2015-12-28 NOTE — Clinical Social Work Note (Signed)
Discussed pt with [name], disposition CSW who requests when pt is alert enough for assessment, that a TTS consult be placed. MD notified. Will continue to follow.   [name], [place]

## 2015-12-28 NOTE — BH Assessment (Signed)
[place] Assessment Progress Note   This clinician called to assess patient. Spoke with Wendee Copp in ICU who stated patient was drowsy and not answering many questions. This clinician will call back this afternoon or ICU will call TTS when patient is more alert and able to be assessed.

## 2015-12-28 NOTE — Progress Notes (Signed)
PROGRESS NOTE    [name]  TKP:[id number] DOB: November 30, 1965 DOA: 12/25/2015 PCP: [name], MD    Brief Narrative:  [name] is a 50 y.o. male with medical history significant of bipolar, DM, PTSD, CKD was found sitting in his car right after ingesting over a 100 clonazepam pills (2mg  each), tramadol about 10 pills, and a couple of vicodin.  His story has changed and been inconsistent so dont really know accurately what he took.  He was given narcan on arrival with minimal response.  Over the last 5 hours in the ED he has become more and more drowsy.  He responds to my loud voice, is able to produce a strong cough although with much coaching and is answering questions however unsure the accuracy of the answers.  Pt says he was trying to kill himself.  Pt referred for admission for multiple drug overdose.   Assessment & Plan:   Principal Problem:   Suicidal overdose ([place]) Active Problems:   Essential hypertension   Diabetes type 2, uncontrolled ([place])   CRF (chronic renal failure)   Suicide attempt by multiple drug overdose ([place])   Multiple drug overdose   Suicidal overdose ([place]) - Received 2 doses of narcan and had some response - Maintaining airway at this time (no need for intubation) - Stepdown unit - Psychiatry consultation placed- TTS consulted today - Mentation still not full cleared and still requiring supplemental oxygen - continue to monitor closely - SLP consulted and voices that patient is to stay NPO at this time   Chronic Kidney Disease (CKD) - Cr Improved with hydration - Baseline creatinine of ~2.7 - Continue IVF at this time - will redraw BMP in am  Diabetes Type 2, uncontrolled - CBG - no need for ISS at this time but may need to add slide when patient IVF or diet is changed    DVT prophylaxis: SCD's, lovenox Code Status: Full Family Communication: No family bedside, patient did not give permission to call family Disposition Plan: Patient  mentation not cleared and he is still requiring supplemental oxygen, will require at least 1-2 additional days to ensure patient is medically stable for discharge.  Will await psychiatry recommendatiosn  Consultants:   Psychiatry  SLP   Procedures:   none  Antimicrobials:   none    Subjective: Patient was awake at time of exam. Voices that he wants the mittens off his hands.  He was asleep most of the night per his nurse.  Patient is still confused but is able to answer a few questions (oriented to person, situation, place- can say he is in the hospital).  Patient still disoriented to time (again stating it was 74).  No family is bedside.   Objective: Vitals:   12/28/15 0400 12/28/15 0438 12/28/15 0500 12/28/15 0711  BP:   123/74   Pulse:   79   Resp:   15   Temp: 97 F (36.1 C)     TempSrc: Axillary     SpO2:   100% 100%  Weight:  94.4 kg (208 lb 1.8 oz)    Height:        Intake/Output Summary (Last 24 hours) at 12/28/15 0737 Last data filed at 12/28/15 0438  Gross per 24 hour  Intake             1900 ml  Output             2950 ml  Net            -  1050 ml   Filed Weights   12/26/15 0643 12/27/15 0500 12/28/15 0438  Weight: 95.9 kg (211 lb 6.7 oz) 95.6 kg (210 lb 12.2 oz) 94.4 kg (208 lb 1.8 oz)    Examination:  General exam: Appears calm and comfortable, drowsy  Respiratory system: Clear to auscultation. Respiratory effort normal. Cardiovascular system: S1 & S2 heard, RRR. No JVD, murmurs, rubs, gallops or clicks. No pedal edema. Gastrointestinal system: Abdomen is nondistended, soft and nontender. No organomegaly or masses felt. Normal bowel sounds heard. Central nervous system: Alert and oriented x 3 (person, situation and somewhat to place- not specific place) No focal neurological deficits. Extremities: Symmetric 5 x 5 power. Neuro: CN II-XII grossly intact, sensation intact, patient able to move upper and lower extremities bilaterally without concern,  no asterixus, but tremor on finger to nose, able to follow instructions with reinforcement. Skin: No rashes, lesions or ulcers Psychiatry: seems sleepy during examination     Data Reviewed: I have personally reviewed following labs and imaging studies  CBC:  Recent Labs Lab 12/25/15 2254 12/26/15 0812 12/27/15 0356 12/28/15 0432  WBC 8.2 6.7 6.6 5.3  NEUTROABS 6.2  --   --  3.6  HGB 12.7* 12.3* 11.2* 10.9*  HCT 36.8* 36.1* 33.8* 32.7*  MCV 98.7 100.0 104.6* 101.9*  PLT 76* 72* 69* 79*   Basic Metabolic Panel:  Recent Labs Lab 12/25/15 2254 12/26/15 0812 12/27/15 0356  NA 136  --  140  K 3.2*  --  4.2  CL 102  --  111  CO2 25  --  23  GLUCOSE 131*  --  107*  BUN 26*  --  32*  CREATININE 3.02* 2.76* 2.73*  CALCIUM 8.8*  --  8.6*   GFR: Estimated Creatinine Clearance: 39.6 mL/min (by C-G formula based on SCr of 2.73 mg/dL). Liver Function Tests:  Recent Labs Lab 12/25/15 2254 12/27/15 0356  AST 50* 36  ALT 47 35  ALKPHOS 75 59  BILITOT 0.8 0.9  PROT 6.9 6.2*  ALBUMIN 3.6 3.0*   No results for input(s): LIPASE, AMYLASE in the last 168 hours. No results for input(s): AMMONIA in the last 168 hours. Coagulation Profile: No results for input(s): INR, PROTIME in the last 168 hours. Cardiac Enzymes: No results for input(s): CKTOTAL, CKMB, CKMBINDEX, TROPONINI in the last 168 hours. BNP (last 3 results) No results for input(s): PROBNP in the last 8760 hours. HbA1C: No results for input(s): HGBA1C in the last 72 hours. CBG: No results for input(s): GLUCAP in the last 168 hours. Lipid Profile: No results for input(s): CHOL, HDL, LDLCALC, TRIG, CHOLHDL, LDLDIRECT in the last 72 hours. Thyroid Function Tests: No results for input(s): TSH, T4TOTAL, FREET4, T3FREE, THYROIDAB in the last 72 hours. Anemia Panel: No results for input(s): VITAMINB12, FOLATE, FERRITIN, TIBC, IRON, RETICCTPCT in the last 72 hours. Sepsis Labs: No results for input(s): PROCALCITON,  LATICACIDVEN in the last 168 hours.  Recent Results (from the past 240 hour(s))  MRSA PCR Screening     Status: None   Collection Time: 12/26/15  6:43 AM  Result Value Ref Range Status   MRSA by PCR NEGATIVE NEGATIVE Final    Comment:        The GeneXpert MRSA Assay (FDA approved for NASAL specimens only), is one component of a comprehensive MRSA colonization surveillance program. It is not intended to diagnose MRSA infection nor to guide or monitor treatment for MRSA infections.          Radiology  Studies: No results found.      Scheduled Meds: . enoxaparin (LOVENOX) injection  40 mg Subcutaneous Q24H  . ipratropium-albuterol  3 mL Nebulization TID   Continuous Infusions: . 0.9 % NaCl with KCl 20 mEq / L 100 mL/hr at 12/27/15 2354     LOS: 1 day    Time spent: 30 minutes    Newman Pies, MD [facility] Pager [phone]  If 7PM-7AM, please contact night-coverage [email or web address] Password [facility] 12/28/2015, 7:37 AM

## 2015-12-29 DIAGNOSIS — R404 Transient alteration of awareness: Secondary | ICD-10-CM

## 2015-12-29 LAB — BASIC METABOLIC PANEL
ANION GAP: 6 (ref 5–15)
BUN: 30 mg/dL — ABNORMAL HIGH (ref 6–20)
CALCIUM: 8.7 mg/dL — AB (ref 8.9–10.3)
CHLORIDE: 111 mmol/L (ref 101–111)
CO2: 22 mmol/L (ref 22–32)
Creatinine, Ser: 2.15 mg/dL — ABNORMAL HIGH (ref 0.61–1.24)
GFR calc non Af Amer: 35 mL/min — ABNORMAL LOW (ref >60)
GFR, EST AFRICAN AMERICAN: 40 mL/min — AB (ref >60)
Glucose, Bld: 87 mg/dL (ref 65–99)
Potassium: 3.9 mmol/L (ref 3.5–5.1)
Sodium: 139 mmol/L (ref 135–145)

## 2015-12-29 LAB — GLUCOSE, CAPILLARY: GLUCOSE-CAPILLARY: 80 mg/dL (ref 65–99)

## 2015-12-29 NOTE — Progress Notes (Signed)
Permission to due in and out cath, 1000 residual

## 2015-12-29 NOTE — Progress Notes (Signed)
Patient passed nurse swallow screen. Was able to swallow water, ice chips, and apple sauce

## 2015-12-29 NOTE — Progress Notes (Signed)
PROGRESS NOTE    [name]  TFT:[id number] DOB: 08-01-1965 DOA: 12/25/2015 PCP: [name], MD    Brief Narrative:  [name] is a 50 y.o. male with medical history significant of bipolar, DM, PTSD, CKD was found sitting in his car right after ingesting over a 100 clonazepam pills (2mg  each), tramadol about 10 pills, and a couple of vicodin.  His story has changed and been inconsistent so dont really know accurately what he took.  He was given narcan on arrival with minimal response.  Over the last 5 hours in the ED he has become more and more drowsy.  He responds to my loud voice, is able to produce a strong cough although with much coaching and is answering questions however unsure the accuracy of the answers.  Pt says he was trying to kill himself.  Pt referred for admission for multiple drug overdose.   Assessment & Plan:   Principal Problem:   Suicidal overdose ([place]) Active Problems:   Essential hypertension   Diabetes type 2, uncontrolled ([place])   CRF (chronic renal failure)   Suicide attempt by multiple drug overdose ([place])   Multiple drug overdose   Altered mental status   Suicidal overdose ([place]) - Received 2 doses of narcan and had some response - Maintaining airway at this time (no need for intubation) - Stepdown unit - Psychiatry consultation placed- patient too drowsy to be assessed yesterday - Mentation significantly improved this morning- A and O x4 - no supplemental oxygen at this time - continue to monitor closely - SLP consulted and voices that patient is to stay NPO at this time- will need reassessing as patient mentation much clearer today - can d/c foley if patient mentation remains clear today  Chronic Kidney Disease (CKD) - Cr Improved with hydration - Baseline creatinine of ~2.7 - Continue IVF at this time - pending SLP eval and safety of swallowing can d/c IVF when patient tolerating PO  Diabetes Type 2, uncontrolled - CBG - no need for ISS  at this time but may need to add slide when patient IVF or diet is changed  Thrombocytopenia - Improved - Continue to monitor  Anemia - likely dilutional - slowly trending down - will redraw H/H tomorrow am    DVT prophylaxis: SCD's, lovenox Code Status: Full Family Communication: Patient awake- states he called his sister- asked if I would also call his sister.  Called patient's sister [name] at [phone] Disposition Plan: Pending patient's status throughout the day can consider transferring out of step down to med- surg today.  Disposition at discharge pending TTS recommendations  Consultants:   Psychiatry  SLP   Procedures:   none  Antimicrobials:   none    Subjective: Patient awake, alert and oriented this morning.  He is watching television.  States his night had ups and downs and voices that he feels as though he is on the up and up. Oriented x 4 this morning.  Voices that he knows why he is here.  Wants help with battling his depression.  Is hungry- would like pizza.  Denies chest pain, chest pressure, shortness of breath, increased work of breathing, abdominal pain.  Does voice he feels like he needs to urinate but has a foley catheter.   Objective: Vitals:   12/29/15 0000 12/29/15 0350 12/29/15 0500 12/29/15 0600  BP: 135/81  110/64 119/74  Pulse:      Resp: 20  17 17   Temp: 98.7 F (37.1  C)     TempSrc: Oral     SpO2: 100%     Weight:  93.1 kg (205 lb 4 oz)    Height:        Intake/Output Summary (Last 24 hours) at 12/29/15 0728 Last data filed at 12/29/15 0349  Gross per 24 hour  Intake             3200 ml  Output             3700 ml  Net             -500 ml   Filed Weights   12/27/15 0500 12/28/15 0438 12/29/15 0350  Weight: 95.6 kg (210 lb 12.2 oz) 94.4 kg (208 lb 1.8 oz) 93.1 kg (205 lb 4 oz)    Examination:  General exam: Appears calm and comfortable, drowsy  Respiratory system: Clear to auscultation. Respiratory effort  normal. Cardiovascular system: S1 & S2 heard, RRR. No JVD, murmurs, rubs, gallops or clicks. No pedal edema. Gastrointestinal system: Abdomen is nondistended, soft and nontender. No organomegaly or masses felt. Normal bowel sounds heard. Central nervous system: Alert and oriented x 4 No focal neurological deficits. Extremities: Symmetric 5 x 5 power and FROM of upper and lower extremities bilaterally Neuro: CN II-XII grossly intact, sensation intact, no focal deficits Skin: No rashes, lesions or ulcers Psychiatry: alert, oriented, seems sad when discussing reason for admission    Data Reviewed: I have personally reviewed following labs and imaging studies  CBC:  Recent Labs Lab 12/25/15 2254 12/26/15 0812 12/27/15 0356 12/28/15 0432  WBC 8.2 6.7 6.6 5.3  NEUTROABS 6.2  --   --  3.6  HGB 12.7* 12.3* 11.2* 10.9*  HCT 36.8* 36.1* 33.8* 32.7*  MCV 98.7 100.0 104.6* 101.9*  PLT 76* 72* 69* 79*   Basic Metabolic Panel:  Recent Labs Lab 12/25/15 2254 12/26/15 0812 12/27/15 0356  NA 136  --  140  K 3.2*  --  4.2  CL 102  --  111  CO2 25  --  23  GLUCOSE 131*  --  107*  BUN 26*  --  32*  CREATININE 3.02* 2.76* 2.73*  CALCIUM 8.8*  --  8.6*   GFR: Estimated Creatinine Clearance: 39.6 mL/min (by C-G formula based on SCr of 2.73 mg/dL). Liver Function Tests:  Recent Labs Lab 12/25/15 2254 12/27/15 0356  AST 50* 36  ALT 47 35  ALKPHOS 75 59  BILITOT 0.8 0.9  PROT 6.9 6.2*  ALBUMIN 3.6 3.0*   No results for input(s): LIPASE, AMYLASE in the last 168 hours. No results for input(s): AMMONIA in the last 168 hours. Coagulation Profile: No results for input(s): INR, PROTIME in the last 168 hours. Cardiac Enzymes: No results for input(s): CKTOTAL, CKMB, CKMBINDEX, TROPONINI in the last 168 hours. BNP (last 3 results) No results for input(s): PROBNP in the last 8760 hours. HbA1C: No results for input(s): HGBA1C in the last 72 hours. CBG: No results for input(s): GLUCAP  in the last 168 hours. Lipid Profile: No results for input(s): CHOL, HDL, LDLCALC, TRIG, CHOLHDL, LDLDIRECT in the last 72 hours. Thyroid Function Tests: No results for input(s): TSH, T4TOTAL, FREET4, T3FREE, THYROIDAB in the last 72 hours. Anemia Panel: No results for input(s): VITAMINB12, FOLATE, FERRITIN, TIBC, IRON, RETICCTPCT in the last 72 hours. Sepsis Labs: No results for input(s): PROCALCITON, LATICACIDVEN in the last 168 hours.  Recent Results (from the past 240 hour(s))  MRSA PCR Screening  Status: None   Collection Time: 12/26/15  6:43 AM  Result Value Ref Range Status   MRSA by PCR NEGATIVE NEGATIVE Final    Comment:        The GeneXpert MRSA Assay (FDA approved for NASAL specimens only), is one component of a comprehensive MRSA colonization surveillance program. It is not intended to diagnose MRSA infection nor to guide or monitor treatment for MRSA infections.          Radiology Studies: No results found.      Scheduled Meds: . enoxaparin (LOVENOX) injection  40 mg Subcutaneous Q24H   Continuous Infusions: . 0.9 % NaCl with KCl 20 mEq / L 1,000 mL (12/28/15 2030)     LOS: 2 days    Time spent: 30 minutes    Newman Pies, MD [facility] Pager [phone]  If 7PM-7AM, please contact night-coverage [email or web address] Password [email or web address] 12/29/2015, 7:28 AM

## 2015-12-29 NOTE — BH Assessment (Signed)
Assessment Note  [name] is an 50 y.o. male, who attempted suicide by OD on 100 Klonopin 2 mg tablets, 10 Tramadol, and 2 or 3 Vicodin.  The PT is currently in ICU.  PT reports "I didn't want to live anymore."   PT presented orientated x3, mood "anxious and depressed", affect blunted and congruent with mood.  PT denied current SI, HI, AVH.  He reports experiecing AH of persecutory voices years ago when Father was alive.  PT reports experiencing current negative thoughts about being worthless and no good on a daily basis.    PT reports attempting suicide 3 previous times and being hospitalization after each.  He acknowledged 2009 hospitalization at [facility], but could not recall years or locations of other hospitalizations.  PT reports appetite is fine, sleep 8 to 10 hrs nightly, which is baseline, and experiencing daily social anxiety to the point of not be able to leave the home.  PT reports currently seeing Donnal Moat for med management and also receiving talk therapy, which he reports is not beneficial.  He also reports not being medication adherent on a daily basis to Depakote regimen..  PT denied any substance use. PT meets inpatient criteria.    Diagnosis:  Bipolar Disorder, PTSD  Past Medical History:  Past Medical History:  Diagnosis Date  . ADD (attention deficit disorder)   . Anxiety   . Arthritis    right hip  . Bipolar 1 disorder ([place])   . Blood in urine   . CKD (chronic kidney disease), stage III   . Creatinine elevation   . Depression    bipolar [facility]  . Diabetes mellitus without complication ([place])   . Family history of anesthesia complication    pt is unsure , but pt father may have been difficult to arouse   . History of kidney stones   . Hypertension   . Hypogonadism male   . Liver fatty degeneration   . Microscopic hematuria    hereditary s/p Urology eval  . Pneumonia 10-2012  . PTSD (post-traumatic stress disorder)    SOCIAL ANXIETY DISORDER     Past  Surgical History:  Procedure Laterality Date  . BACK SURGERY    . CLOSED REDUCTION METACARPAL WITH PERCUTANEOUS PINNING Right   . LUMBAR DISC SURGERY    . TONSILLECTOMY    . TOTAL HIP ARTHROPLASTY Right 08/16/2013   Procedure: TOTAL HIP ARTHROPLASTY ANTERIOR APPROACH;  Surgeon: Hessie Dibble, MD;  Location: [place];  Service: Orthopedics;  Laterality: Right;  . TOTAL HIP ARTHROPLASTY Left 05/22/2015   Procedure: TOTAL HIP ARTHROPLASTY ANTERIOR APPROACH;  Surgeon: Melrose Nakayama, MD;  Location: [place];  Service: Orthopedics;  Laterality: Left;    Family History:  Family History  Problem Relation Age of Onset  . Diabetes Father   . Cancer Mother     died of melanoma with mets  . Cervical cancer Sister   . Diabetes Sister   . Other Neg Hx     hypogonadism    Social History:  reports that he quit smoking about 22 months ago. His smoking use included Cigarettes. He quit after 0.00 years of use. He has never used smokeless tobacco. He reports that he does not drink alcohol or use drugs.  Additional Social History:     CIWA: CIWA-Ar BP: (!) 147/83 Pulse Rate: 74 COWS:    Allergies:  Allergies  Allergen Reactions  . Vicodin [Hydrocodone-Acetaminophen] Itching    Home Medications:  Medications Prior  to Admission  Medication Sig Dispense Refill  . amLODipine (NORVASC) 5 MG tablet Take 1 tablet (5 mg total) by mouth daily. 30 tablet 11  . atorvastatin (LIPITOR) 10 MG tablet Take 1 tablet (10 mg total) by mouth daily. 90 tablet 3  . b complex vitamins tablet Take 1 tablet by mouth daily. Reported on 07/09/2015    . buPROPion (WELLBUTRIN XL) 150 MG 24 hr tablet Take 1 tablet (150 mg total) by mouth at bedtime. 90 tablet 3  . buPROPion (WELLBUTRIN XL) 300 MG 24 hr tablet Take 1 tablet (300 mg total) by mouth daily. 300 mg in the morning and 150 mg in the evening. 90 tablet 3  . cholecalciferol (VITAMIN D) 1000 UNITS tablet Take 5,000 Units by mouth daily.     . clonazePAM (KLONOPIN) 2  MG tablet Take 1 tablet (2 mg total) by mouth 2 (two) times daily as needed for anxiety. 180 tablet 0  . divalproex (DEPAKOTE ER) 500 MG 24 hr tablet Take 3 tablets (1,500 mg total) by mouth at bedtime. 90 tablet 1  . Eszopiclone (ESZOPICLONE) 3 MG TABS Take 3 mg by mouth at bedtime. Take immediately before bedtime    . feeding supplement, GLUCERNA SHAKE, (GLUCERNA SHAKE) LIQD Take 237 mLs by mouth daily. 30 Can 0  . insulin aspart (NOVOLOG) 100 UNIT/ML injection Before each meal 3 times a day, 140-199 - 2 units, 200-250 - 4 units, 251-299 - 6 units,  300-349 - 8 units,  350 or above 10 units. Insulin PEN if approved, provide syringes and needles if needed. 10 mL 0  . mirtazapine (REMERON) 45 MG tablet Take 1 tablet (45 mg total) by mouth at bedtime. 30 tablet 11  . Multiple Vitamin (MULTIVITAMIN WITH MINERALS) TABS Take 1 tablet by mouth every morning.    . polyethylene glycol (MIRALAX / GLYCOLAX) packet Take 17 g by mouth daily. 14 each 0  . QUEtiapine (SEROQUEL) 400 MG tablet Take 1 tablet (400 mg total) by mouth at bedtime. 90 tablet 1  . senna-docusate (SENOKOT-S) 8.6-50 MG tablet Take 2 tablets by mouth 2 (two) times daily. 30 tablet 0  . sildenafil (REVATIO) 20 MG tablet 1-5 pills, as needed for ED symptoms 60 tablet 5  . testosterone cypionate (DEPOTESTOSTERONE CYPIONATE) 200 MG/ML injection Inject 0.38 mLs (76 mg total) into the muscle once a week. 10 mL 5    OB/GYN Status:  No LMP for male patient.  General Assessment Data Location of Assessment: [facility] (ICU) TTS Assessment: In system Is this a Tele or Face-to-Face Assessment?: Tele Assessment Is this an Initial Assessment or a Re-assessment for this encounter?: Initial Assessment Marital status: Single Maiden name: N/A Is patient pregnant?: No Pregnancy Status: No Living Arrangements: Other relatives (lives with Sister) Can pt return to current living arrangement?: Yes Admission Status: Voluntary Is patient capable of signing  voluntary admission?: Yes Referral Source: Self/Family/Friend Insurance type: Audiological scientist [employer] Screening Exam (Advance) Medical Exam completed: Yes  Crisis Care Plan Living Arrangements: Other relatives (lives with Sister) Legal Guardian: Other: (Self) Name of Psychiatrist: Donnal Moat Name of Therapist: Unsure of name  Education Status Is patient currently in school?: No Current Grade: N/A Highest grade of school patient has completed: GED Name of school: N/A Contact person: N/A  Risk to self with the past 6 months Suicidal Ideation: No-Not Currently/Within Last 6 Months Has patient been a risk to self within the past 6 months prior to admission? :  Yes Suicidal Intent: No-Not Currently/Within Last 6 Months Has patient had any suicidal intent within the past 6 months prior to admission? : Yes Is patient at risk for suicide?: Yes Suicidal Plan?: No-Not Currently/Within Last 6 Months Has patient had any suicidal plan within the past 6 months prior to admission? : Yes Access to Means: Yes Specify Access to Suicidal Means: PT OD on prescribed meds What has been your use of drugs/alcohol within the last 12 months?: No illicit substance use Previous Attempts/Gestures: Yes How many times?: 3 Other Self Harm Risks: None Triggers for Past Attempts: Other (Comment) (PT reports n ot wanting to live) Intentional Self Injurious Behavior: None Family Suicide History: Unknown Recent stressful life event(s): Other (Comment) (social anxiety) Persecutory voices/beliefs?: Yes Depression: Yes Depression Symptoms: Feeling worthless/self pity, Loss of interest in usual pleasures, Despondent Substance abuse history and/or treatment for substance abuse?: No Suicide prevention information given to non-admitted patients: Not applicable  Risk to Others within the past 6 months Homicidal Ideation: No Does patient have any lifetime risk of violence toward others beyond the  six months prior to admission? : No Thoughts of Harm to Others: No Current Homicidal Intent: No Current Homicidal Plan: No Access to Homicidal Means: No Identified Victim: N/A History of harm to others?: No Assessment of Violence: None Noted Violent Behavior Description: N/A Does patient have access to weapons?: No (Sister has gun in the home in locked box, PT unaware of key ) Criminal Charges Pending?: No Does patient have a court date: No Is patient on probation?: No  Psychosis Hallucinations: Auditory (Years ago when Father was alive) Delusions: Persecutory  Mental Status Report Appearance/Hygiene: In scrubs Eye Contact: Fair Motor Activity: Unremarkable Speech: Logical/coherent, Slow Level of Consciousness: Alert Mood: Depressed, Anxious Affect: Blunted, Anxious, Depressed Anxiety Level: Moderate Thought Processes: Coherent, Relevant Judgement: Impaired Orientation: Person, Place, Time Obsessive Compulsive Thoughts/Behaviors: None  Cognitive Functioning Concentration: Decreased Memory: Recent Intact, Remote Intact IQ: Average Insight: Poor Impulse Control: Poor Appetite: Fair Weight Loss: 0 Weight Gain: 0 Sleep: No Change Total Hours of Sleep: 10 Vegetative Symptoms: None  ADLScreening [facility] Assessment Services) Patient's cognitive ability adequate to safely complete daily activities?: Yes Patient able to express need for assistance with ADLs?: Yes Independently performs ADLs?: Yes (appropriate for developmental age)  Prior Inpatient Therapy Prior Inpatient Therapy: Yes Prior Therapy Dates: 2009 Prior Therapy Facilty/Provider(s): [facility] Reason for Treatment: Bipolar  Prior Outpatient Therapy Prior Outpatient Therapy: Yes Prior Therapy Dates: Current Prior Therapy Facilty/Provider(s): Donnal Moat ([facility]) Reason for Treatment: Bipolar and PTSD, Social Anxiety Does patient have an ACCT team?: No Does patient have Intensive In-House  Services?  : No Does patient have [facility] services? : No Does patient have P4CC services?: No  ADL Screening (condition at time of admission) Patient's cognitive ability adequate to safely complete daily activities?: Yes Is the patient deaf or have difficulty hearing?: Yes Does the patient have difficulty seeing, even when wearing glasses/contacts?: No Does the patient have difficulty concentrating, remembering, or making decisions?: No Patient able to express need for assistance with ADLs?: Yes Does the patient have difficulty dressing or bathing?: No Independently performs ADLs?: Yes (appropriate for developmental age) Does the patient have difficulty walking or climbing stairs?: No Weakness of Legs: None Weakness of Arms/Hands: None  Home Assistive Devices/Equipment Home Assistive Devices/Equipment: None    Abuse/Neglect Assessment (Assessment to be complete while patient is alone) Physical Abuse: Denies Verbal Abuse: Denies Sexual Abuse: Denies Exploitation of patient/patient's resources: Denies Self-Neglect:  Denies     Advance Directives (For Healthcare) Does patient have an advance directive?: No    Additional Information 1:1 In Past 12 Months?: Yes CIRT Risk: No Elopement Risk: No Does patient have medical clearance?: Yes     Disposition:  Disposition Initial Assessment Completed for this Encounter: Yes Disposition of Patient: Inpatient treatment program Type of inpatient treatment program: Adult  On Site Evaluation by:   Reviewed with Physician:    Dey-Johnson,Ulyssa Walthour 12/29/2015 12:51 PM

## 2015-12-30 LAB — CBC WITH DIFFERENTIAL/PLATELET
BASOS PCT: 0 %
Basophils Absolute: 0 10*3/uL (ref 0.0–0.1)
Eosinophils Absolute: 0 10*3/uL (ref 0.0–0.7)
Eosinophils Relative: 1 %
HEMATOCRIT: 35.8 % — AB (ref 39.0–52.0)
HEMOGLOBIN: 12 g/dL — AB (ref 13.0–17.0)
LYMPHS ABS: 1.1 10*3/uL (ref 0.7–4.0)
LYMPHS PCT: 20 %
MCH: 33.2 pg (ref 26.0–34.0)
MCHC: 33.5 g/dL (ref 30.0–36.0)
MCV: 99.2 fL (ref 78.0–100.0)
MONOS PCT: 14 %
Monocytes Absolute: 0.8 10*3/uL (ref 0.1–1.0)
NEUTROS ABS: 3.6 10*3/uL (ref 1.7–7.7)
NEUTROS PCT: 65 %
Platelets: 144 10*3/uL — ABNORMAL LOW (ref 150–400)
RBC: 3.61 MIL/uL — ABNORMAL LOW (ref 4.22–5.81)
RDW: 13.4 % (ref 11.5–15.5)
WBC: 5.5 10*3/uL (ref 4.0–10.5)

## 2015-12-30 LAB — GLUCOSE, CAPILLARY: Glucose-Capillary: 86 mg/dL (ref 65–99)

## 2015-12-30 NOTE — Progress Notes (Signed)
PROGRESS NOTE    [name]  VOH:[id number] DOB: Aug 14, 1965 DOA: 12/25/2015 PCP: [name], MD    Brief Narrative:  [name] is a 50 y.o. male with medical history significant of bipolar, DM, PTSD, CKD was found sitting in his car right after ingesting over a 100 clonazepam pills (2mg  each), tramadol about 10 pills, and a couple of vicodin.  His story has changed and been inconsistent so dont really know accurately what he took.  He was given narcan on arrival with minimal response.  Over the last 5 hours in the ED he has become more and more drowsy.  He responds to my loud voice, is able to produce a strong cough although with much coaching and is answering questions however unsure the accuracy of the answers.  Pt says he was trying to kill himself.  Pt referred for admission for multiple drug overdose.   Assessment & Plan:   Principal Problem:   Suicidal overdose ([place]) Active Problems:   Essential hypertension   Diabetes type 2, uncontrolled ([place])   CRF (chronic renal failure)   Suicide attempt by multiple drug overdose ([place])   Multiple drug overdose   Altered mental status   Suicidal overdose ([facility]) - Received 2 doses of narcan and had some response - Mentation significantly improved this morning- A and O x4 - no supplemental oxygen at this time - bedside swallow shows no signs of aspiration: carb modified diet order - can transfer to med- surg floor today - TSS states patient is appropriate for inpatient care   Chronic Kidney Disease (CKD) - Cr Improved with hydration - Baseline creatinine of ~2.7 - can d/c IVF today  Diabetes Type 2, uncontrolled - CBG - blood sugars have been well controlled - will continue to monitor and add ISS if needed  Thrombocytopenia - Improved - Continue to monitor - CBCD from this morning pending  Anemia - likely dilutional - slowly trending down - CBCD pending  Urinary Retention - will attempt to discontinue foley again  today - will follow up on bladder scan to ensure not retaining - patient reports issues with urinating before this admission mostly linked to psych component     DVT prophylaxis: SCD's, lovenox Code Status: Full Family Communication: Patient awake- mentions he has informed his sister what has been happening Disposition Plan: Transfer to med- surg today- if remains stable can likely discharge in 24-48 hours  Consultants:   Psychiatry  SLP   Procedures:   none  Antimicrobials:   none    Subjective: Patient doing well today.  Voices that he feels better than he has in a long time.  Discussed TTS recommendations of going to inpatient facility at time of discharge and patient is agreeable to that.  Did discuss with patient the urinary retention patient experienced yesterday and he explains he has a hard time urinating in public/ in front of other people.  Is willing to try to discontinue foley again today.  Eating well- no signs of aspiration.  Slept well.  Asking for pizza or [employer].  No signs of bleeding, no chest pain, chest pressure, shortness of breath, increased work of breathing, abdominal pain.     Objective: Vitals:   12/30/15 0200 12/30/15 0300 12/30/15 0400 12/30/15 0500  BP: 134/80 127/81  106/74  Pulse: 65 71 72 71  Resp: 18 15 20 20   Temp:      TempSrc:      SpO2: 100% 99% 98% 98%  Weight:      Height:        Intake/Output Summary (Last 24 hours) at 12/30/15 0746 Last data filed at 12/30/15 0500  Gross per 24 hour  Intake             2100 ml  Output             3800 ml  Net            -1700 ml   Filed Weights   12/27/15 0500 12/28/15 0438 12/29/15 0350  Weight: 95.6 kg (210 lb 12.2 oz) 94.4 kg (208 lb 1.8 oz) 93.1 kg (205 lb 4 oz)    Examination:  General exam: Appears calm and comfortable, awake and alert Respiratory system: Clear to auscultation. Respiratory effort normal. Cardiovascular system: S1 & S2 heard, RRR. No JVD, murmurs, rubs, gallops  or clicks. No pedal edema. Gastrointestinal system: Abdomen is nondistended, soft and nontender. No organomegaly or masses felt. Normal bowel sounds heard. Central nervous system: Alert and oriented x 4 No focal neurological deficits. Extremities: Symmetric 5 x 5 power and FROM of upper and lower extremities bilaterally Neuro: CN II-XII grossly intact, sensation intact, no focal deficits Skin: No rashes, lesions or ulcers Psychiatry: alert, oriented, more optimistic today    Data Reviewed: I have personally reviewed following labs and imaging studies  CBC:  Recent Labs Lab 12/25/15 2254 12/26/15 0812 12/27/15 0356 12/28/15 0432  WBC 8.2 6.7 6.6 5.3  NEUTROABS 6.2  --   --  3.6  HGB 12.7* 12.3* 11.2* 10.9*  HCT 36.8* 36.1* 33.8* 32.7*  MCV 98.7 100.0 104.6* 101.9*  PLT 76* 72* 69* 79*   Basic Metabolic Panel:  Recent Labs Lab 12/25/15 2254 12/26/15 0812 12/27/15 0356 12/29/15 0858  NA 136  --  140 139  K 3.2*  --  4.2 3.9  CL 102  --  111 111  CO2 25  --  23 22  GLUCOSE 131*  --  107* 87  BUN 26*  --  32* 30*  CREATININE 3.02* 2.76* 2.73* 2.15*  CALCIUM 8.8*  --  8.6* 8.7*   GFR: Estimated Creatinine Clearance: 50.2 mL/min (by C-G formula based on SCr of 2.15 mg/dL). Liver Function Tests:  Recent Labs Lab 12/25/15 2254 12/27/15 0356  AST 50* 36  ALT 47 35  ALKPHOS 75 59  BILITOT 0.8 0.9  PROT 6.9 6.2*  ALBUMIN 3.6 3.0*   No results for input(s): LIPASE, AMYLASE in the last 168 hours. No results for input(s): AMMONIA in the last 168 hours. Coagulation Profile: No results for input(s): INR, PROTIME in the last 168 hours. Cardiac Enzymes: No results for input(s): CKTOTAL, CKMB, CKMBINDEX, TROPONINI in the last 168 hours. BNP (last 3 results) No results for input(s): PROBNP in the last 8760 hours. HbA1C: No results for input(s): HGBA1C in the last 72 hours. CBG:  Recent Labs Lab 12/29/15 1615  GLUCAP 80   Lipid Profile: No results for input(s):  CHOL, HDL, LDLCALC, TRIG, CHOLHDL, LDLDIRECT in the last 72 hours. Thyroid Function Tests: No results for input(s): TSH, T4TOTAL, FREET4, T3FREE, THYROIDAB in the last 72 hours. Anemia Panel: No results for input(s): VITAMINB12, FOLATE, FERRITIN, TIBC, IRON, RETICCTPCT in the last 72 hours. Sepsis Labs: No results for input(s): PROCALCITON, LATICACIDVEN in the last 168 hours.  Recent Results (from the past 240 hour(s))  MRSA PCR Screening     Status: None   Collection Time: 12/26/15  6:43 AM  Result Value Ref Range  Status   MRSA by PCR NEGATIVE NEGATIVE Final    Comment:        The GeneXpert MRSA Assay (FDA approved for NASAL specimens only), is one component of a comprehensive MRSA colonization surveillance program. It is not intended to diagnose MRSA infection nor to guide or monitor treatment for MRSA infections.          Radiology Studies: No results found.      Scheduled Meds: . enoxaparin (LOVENOX) injection  40 mg Subcutaneous Q24H   Continuous Infusions: . 0.9 % NaCl with KCl 20 mEq / L 100 mL/hr at 12/29/15 1604     LOS: 3 days    Time spent: 30 minutes    Newman Pies, MD [facility] Pager [phone]  If 7PM-7AM, please contact night-coverage [email or web address] Password [facility] 12/30/2015, 7:46 AM

## 2015-12-30 NOTE — Progress Notes (Signed)
Speech Language Pathology Treatment: Dysphagia  Patient Details Name: [name] MRN: [id number] DOB: 06-05-1965 Today's Date: 12/30/2015 Time: 5465-6812 SLP Time Calculation (min) (ACUTE ONLY): 27 min  Assessment / Plan / Recommendation Clinical Impression  [name] was seen at bedside this AM to assess safety with po intake. He was extremely lethargic on Friday and was made NPO. RN swallow screen was administered yesterday and pt was started on a regular diet with thin liquids. Pt received semi reclined in bed finishing his breakfast tray. He was agreeable to SLP assessment. Pt reports occasional difficulties swallowing sticky/dry foods "like peanut butter" at baseline. He is agreeable to answer SLP questions, but seems uncertain about history provided (pneumonia a year ago, then stated it was in June... "epi pen" for diabetes recently prescribed, possible stroke in June).   Oral motor examination reveals mild lingual deviation to the left, otherwise WNL for strength, ROM, coordination across domains. No overt signs or symptoms of aspiration indicated with regular textures, puree, or thin liquids. Pt does present with delayed throat clearing after trials/meal. Aspiration precautions were reviewed, primarily being that he needs to elevate HOB upright for all eating/drinking and remain upright for at least 30 minutes after. Continue regular diet with thin liquids with standard aspiration and reflux precautions and monitor lungs/temps given chest x-ray findings (see below). Unsure if pt vomited after polypharmacy ingestion (could not find in EPIC notes). SLP will follow x1 for diet tolerance.  Chest x-ray after admission: FINDINGS: Low lung volumes are present, causing crowding of the pulmonary vasculature. The patient is rotated to the left on today's radiograph, reducing diagnostic sensitivity and specificity. Accounting for the very low lung volumes, the vague perihilar densities are  ascribed to vasculature. There may be a small component of basilar and perihilar atelectasis. No definite signs of aspiration pneumonitis although surveillance may be warranted.  Thoracic spondylosis. Heart size within normal limits for projection.  IMPRESSION: 1. Very low lung volumes. No definite airspace opacity aside from some mild perihilar and basilar atelectasis.   HPI HPI: [name] a 50 y.o.malewith medical history significant of bipolar, DM, PTSD, CKD was found sitting in his car right after ingesting over a 100 clonazepam pills (2mg  each), tramadol about 10 pills, and a couple of vicodin. His story has changed and been inconsistent so dont really know accurately what he took. He was given narcan on arrival with minimal response. Over the last 5 hours in the ED he has become more and more drowsy      SLP Plan  Continue with current plan of care     Recommendations  Diet recommendations: Regular;Thin liquid Liquids provided via: Cup;Straw Medication Administration: Whole meds with liquid Supervision: Patient able to self feed Postural Changes and/or Swallow Maneuvers: Seated upright 90 degrees;Upright 30-60 min after meal             Oral Care Recommendations: Oral care BID;Patient independent with oral care Follow up Recommendations: None Plan: Continue with current plan of care                  Thank you,  Genene Churn, [place]    [place] 12/30/2015, 8:41 AM

## 2015-12-30 NOTE — Progress Notes (Signed)
Patient unable to void since remove of foley at 1130.  Tried to stand patient up on bedside and use urine and BSC without success. Had running water going at the time as well.  MD aware stated to place foley.

## 2015-12-31 NOTE — Progress Notes (Signed)
Speech Language Pathology Treatment: Dysphagia  Patient Details Name: [name] MRN: [id number] DOB: Jun 07, 1965 Today's Date: 12/31/2015 Time: 9996-7227 SLP Time Calculation (min) (ACUTE ONLY): 8 min  Assessment / Plan / Recommendation Clinical Impression  Pt tolerating regular textures and thin liquids without incident. He mostly complains that he does not find the food appealing, but "swallows fine". Aspiration and reflux precautions reviewed and SLP will sign off. No further SLP needs at this time.    HPI HPI: [name] a 50 y.o.malewith medical history significant of bipolar, DM, PTSD, CKD was found sitting in his car right after ingesting over a 100 clonazepam pills (27m each), tramadol about 10 pills, and a couple of vicodin. His story has changed and been inconsistent so dont really know accurately what he took. He was given narcan on arrival with minimal response. Over the last 5 hours in the ED he has become more and more drowsy      SLP Plan  All goals met;Discharge SLP treatment due to (comment)     Recommendations  Diet recommendations: Regular;Thin liquid Liquids provided via: Cup;Straw Medication Administration: Whole meds with liquid Supervision: Patient able to self feed Postural Changes and/or Swallow Maneuvers: Seated upright 90 degrees;Upright 30-60 min after meal             Oral Care Recommendations: Oral care BID;Patient independent with oral care Follow up Recommendations: None Plan: All goals met;Discharge SLP treatment due to (comment)     GO               Thank you,  DGenene Churn [place]9/02/2016, 3:57 PM

## 2015-12-31 NOTE — Progress Notes (Signed)
PROGRESS NOTE    [name]  CHE:[id number] DOB: 1965-09-25 DOA: 12/25/2015 PCP: [name], MD    Brief Narrative:  [name] is a 49 y.o. male with medical history significant of bipolar, DM, PTSD, CKD was found sitting in his car right after ingesting over a 100 clonazepam pills (2mg  each), tramadol about 10 pills, and a couple of vicodin.  His story has changed and been inconsistent so dont really know accurately what he took.  He was given narcan on arrival with minimal response.  Over the last 5 hours in the ED he has become more and more drowsy.  He responds to my loud voice, is able to produce a strong cough although with much coaching and is answering questions however unsure the accuracy of the answers.  Pt says he was trying to kill himself.  Pt referred for admission for multiple drug overdose.  Mentation slowly cleared and patient was awake, alert and oriented when assessed by TTS.  Patient found to be appropriate for inpatient treatment and he will discharge there at time of discharge.  Patient did have urinary retention during admission for which he required foley catheter.   Assessment & Plan:   Principal Problem:   Suicidal overdose ([place]) Active Problems:   Essential hypertension   Diabetes type 2, uncontrolled ([place])   CRF (chronic renal failure)   Suicide attempt by multiple drug overdose ([place])   Multiple drug overdose   Altered mental status   Suicidal overdose ([place]) - Received 2 doses of narcan and had some response at time of admission - Mentation significantly improved this morning- A and O x4 - no supplemental oxygen at this time - bedside swallow shows no signs of aspiration: carb modified diet order - order in to transfer patient to med- surg - TSS states patient is appropriate for inpatient care - patient asking questions about disability and whether he would qualify or not - will need to follow up with SW to determine disability as well as when  patient can be transferred to inpatient psych facility - will order PT eval today   Chronic Kidney Disease (CKD) - Cr Improved with hydration  Diabetes Type 2, uncontrolled - CBG - blood sugars have been well controlled - will continue to monitor and add ISS if needed  Thrombocytopenia - Improving  Anemia - improved today  Urinary Retention - foley had to be replaced last evening secondary to retention all day - will likely need follow up with urology after discharge  - have tried twice to remove foley catheter but patient has been unable to initiate urination without catheter.    DVT prophylaxis: SCD's, lovenox Code Status: Full Family Communication: Patient awake, would like me to call his sister [name].  Called [name] at [phone] to discuss patient. Disposition Plan: Transfer to med- surg. Discuss with CM when inpt facilities will have a bed   Consultants:   Psychiatry  SLP   Procedures:   none  Antimicrobials:   none    Subjective: Spoke at length with patient's sister (per patient's request) about patient's mental health.  She voices concern about patient's mental stability when patient is home and when he goes to work in terms of falling apart mentally.  Patient has a history of at least 3 suicide attempts.  She voices concern about his current psych doctor as well.  Patient's sister voices that patient wants more independence as he is living at home with her and her  husband. Patient has reiterated this to me as well stating he feels like "a loser" because he is living with his sister. Patient today voices he is feeling better. Asking if there is better food available.  Disappointed that foley catheter had to be replaced last night. Wondering when he will be able to discharge.   Objective: Vitals:   12/31/15 0000 12/31/15 0200 12/31/15 0400 12/31/15 0500  BP: 112/76 113/72    Pulse: 70 68    Resp: 19 16    Temp: 97.4 F (36.3 C)  97.5 F (36.4  C)   TempSrc: Oral  Oral   SpO2: 93% 93%    Weight:    93.1 kg (205 lb 4 oz)  Height:        Intake/Output Summary (Last 24 hours) at 12/31/15 0809 Last data filed at 12/31/15 0500  Gross per 24 hour  Intake              960 ml  Output             3450 ml  Net            -2490 ml   Filed Weights   12/28/15 0438 12/29/15 0350 12/31/15 0500  Weight: 94.4 kg (208 lb 1.8 oz) 93.1 kg (205 lb 4 oz) 93.1 kg (205 lb 4 oz)    Examination:  General exam: Appears calm and comfortable, awake and alert Respiratory system: Clear to auscultation. Respiratory effort normal. Cardiovascular system: S1 & S2 heard, RRR. No JVD, murmurs, rubs, gallops or clicks. No pedal edema. Gastrointestinal system: Abdomen is nondistended, soft and nontender. No organomegaly or masses felt. Normal bowel sounds heard. Central nervous system: Alert and oriented x 4 No focal neurological deficits. Extremities: Symmetric 5 x 5 power and FROM of upper and lower extremities bilaterally Neuro: CN II-XII grossly intact, sensation intact, no focal deficits Skin: No rashes, lesions or ulcers Psychiatry: alert, oriented, more optimistic today    Data Reviewed: I have personally reviewed following labs and imaging studies  CBC:  Recent Labs Lab 12/25/15 2254 12/26/15 0812 12/27/15 0356 12/28/15 0432 12/30/15 0854  WBC 8.2 6.7 6.6 5.3 5.5  NEUTROABS 6.2  --   --  3.6 3.6  HGB 12.7* 12.3* 11.2* 10.9* 12.0*  HCT 36.8* 36.1* 33.8* 32.7* 35.8*  MCV 98.7 100.0 104.6* 101.9* 99.2  PLT 76* 72* 69* 79* 637*   Basic Metabolic Panel:  Recent Labs Lab 12/25/15 2254 12/26/15 0812 12/27/15 0356 12/29/15 0858  NA 136  --  140 139  K 3.2*  --  4.2 3.9  CL 102  --  111 111  CO2 25  --  23 22  GLUCOSE 131*  --  107* 87  BUN 26*  --  32* 30*  CREATININE 3.02* 2.76* 2.73* 2.15*  CALCIUM 8.8*  --  8.6* 8.7*   GFR: Estimated Creatinine Clearance: 50.2 mL/min (by C-G formula based on SCr of 2.15 mg/dL). Liver  Function Tests:  Recent Labs Lab 12/25/15 2254 12/27/15 0356  AST 50* 36  ALT 47 35  ALKPHOS 75 59  BILITOT 0.8 0.9  PROT 6.9 6.2*  ALBUMIN 3.6 3.0*   No results for input(s): LIPASE, AMYLASE in the last 168 hours. No results for input(s): AMMONIA in the last 168 hours. Coagulation Profile: No results for input(s): INR, PROTIME in the last 168 hours. Cardiac Enzymes: No results for input(s): CKTOTAL, CKMB, CKMBINDEX, TROPONINI in the last 168 hours. BNP (last 3 results) No results  for input(s): PROBNP in the last 8760 hours. HbA1C: No results for input(s): HGBA1C in the last 72 hours. CBG:  Recent Labs Lab 12/29/15 1615 12/30/15 0819  GLUCAP 80 86   Lipid Profile: No results for input(s): CHOL, HDL, LDLCALC, TRIG, CHOLHDL, LDLDIRECT in the last 72 hours. Thyroid Function Tests: No results for input(s): TSH, T4TOTAL, FREET4, T3FREE, THYROIDAB in the last 72 hours. Anemia Panel: No results for input(s): VITAMINB12, FOLATE, FERRITIN, TIBC, IRON, RETICCTPCT in the last 72 hours. Sepsis Labs: No results for input(s): PROCALCITON, LATICACIDVEN in the last 168 hours.  Recent Results (from the past 240 hour(s))  MRSA PCR Screening     Status: None   Collection Time: 12/26/15  6:43 AM  Result Value Ref Range Status   MRSA by PCR NEGATIVE NEGATIVE Final    Comment:        The GeneXpert MRSA Assay (FDA approved for NASAL specimens only), is one component of a comprehensive MRSA colonization surveillance program. It is not intended to diagnose MRSA infection nor to guide or monitor treatment for MRSA infections.          Radiology Studies: No results found.      Scheduled Meds: . enoxaparin (LOVENOX) injection  40 mg Subcutaneous Q24H   Continuous Infusions:     LOS: 4 days    Time spent: 30 minutes    Newman Pies, MD [facility] Pager [phone]  If 7PM-7AM, please contact night-coverage [email or web address] Password [email or web address] 12/31/2015, 8:09  AM

## 2015-12-31 NOTE — Progress Notes (Signed)
Referred pt to [facility] for inpatient psychiatric treatment.  Was informed that [place], [place], [place], [place]  unable to accommodate foley catheter on behavioral health units. [facility] and [facility] advised foley catheter is not exclusionary, however no bed availability today. Left voicemail for [facility]. Otherwise no bed availability at facilities contacted today [facility], [place], [facility]). Will continue following.   Sharren Bridge, MSW, LCSW Clinical Social Work, Disposition  12/31/2015 [phone]

## 2015-12-31 NOTE — Clinical Social Work Note (Signed)
CSW spoke with Meghan, disposition CSW regarding pt. Aware that pt has foley and is otherwise medically stable for transfer to inpatient psych. MD notified that most facilities are unable to accept pt with foley. MD order to discontinue catheter- if able to void, will likely be able to transfer today. Will continue to follow and update disposition CSW as needed.    Benay Pike, [place]

## 2016-01-01 ENCOUNTER — Telehealth: Payer: Self-pay | Admitting: Internal Medicine

## 2016-01-01 NOTE — Care Management Note (Signed)
Case Management Note  Patient Details  Name: BRAELYNN LUPTON MRN: [id number] Date of Birth: 10-07-1965  Expected Discharge Date:     01/01/2016             Expected Discharge Plan:  [facility]  In-House Referral:  Clinical Social Work  Discharge planning Services  CM Consult  Post Acute Care Choice:  NA Choice offered to:  NA  DME Arranged:    DME Agency:     HH Arranged:    [place] Agency:     Status of Service:  Completed, signed off  If discussed at [employer] of Stay Meetings, dates discussed:  01/01/2016  Additional Comments: Pt discharging to [facility]. No CM needs at this time.  Sherald Barge, RN 01/01/2016, 3:45 PM

## 2016-01-01 NOTE — Progress Notes (Signed)
Followed up on referral efforts: Amaryllis Dyke advised unsure of today's bed availability as of yet but willing to review referral in case of openings. Faxed to [phone]. [facility]- have not reviewed referrals as of yet, referral in queue [place] [facility]- left voicemail for intake [facility]- left voicemail for intake.  Sharren Bridge, MSW, LCSW Clinical Social Work, Disposition  01/01/2016 [phone]

## 2016-01-01 NOTE — Clinical Social Work Note (Signed)
Notified disposition CSW this morning that pt was able to void. Aware that he required rolling walker with PT. Awaiting psych placement.  Benay Pike, [place]

## 2016-01-01 NOTE — Progress Notes (Signed)
Patient voiding in urinal this morning without difficulty,will continue to monitor output.

## 2016-01-01 NOTE — Progress Notes (Addendum)
Pt accepted to [facility] unit bed 2560-1 by Dr. Kathleen Lime. Pt can arrive anytime (via [facility] as pt is currently voluntary) and RN report can be called at [phone]. (intake contact is Marcello Moores at [facility]- [phone])- states [facility] will need to bring pt to ED for admission to the behavioral health unit.   Sharren Bridge, MSW, LCSW Clinical Social Work, Disposition  01/01/2016 [phone]

## 2016-01-01 NOTE — Discharge Summary (Signed)
Physician Discharge Summary  [name] CHE:[id number] DOB: Sep 19, 1965 DOA: 12/25/2015  PCP: [name], MD  Admit date: 12/25/2015 Discharge date: 01/01/2016  Admitted From: Home Disposition: Inpatient Psychiatric facility    Recommendations for Outpatient Follow-up:  1. Follow up with PCP in 1-2 weeks   Home Health:No Equipment/Devices:None  Discharge Condition: Stable CODE STATUS:Full Code Diet recommendation: Regular  Brief/Interim Summary: [name] a 50 y.o.malewith medical history significant of bipolar, DM, PTSD, CKD was found sitting in his car right after ingesting over a 100 clonazepam pills (2mg  each), tramadol about 10 pills, and a couple of vicodin. His story has changed and been inconsistent so dont really know accurately what he took. He was given narcan on arrival with minimal response. Over the last 5 hours in the ED he has become more and more drowsy. He responds to my loud voice, is able to produce a strong cough although with much coaching and is answering questions however unsure the accuracy of the answers. Pt says he was trying to kill himself. Pt referred for admission for multiple drug overdose.  Mentation slowly cleared and patient was awake, alert and oriented when assessed by TTS.  Patient found to be appropriate for inpatient treatment and he will discharge there at time of discharge.  Patient did have urinary retention during admission for which he required foley catheter.  Discharge Diagnoses:  Principal Problem:   Suicidal overdose ([place]) Active Problems:   Essential hypertension   Diabetes type 2, uncontrolled ([facility])   CRF (chronic renal failure)   Suicide attempt by multiple drug overdose ([place])   Multiple drug overdose   Altered mental status  Suicidal overdose ([facility]) - Received 2 doses of narcan and had some response at time of admission - Mentation significantly improved this morning- A and O x4 - no supplemental oxygen at  this time - bedside swallow shows no signs of aspiration: carb modified diet order - order in to transfer patient to med- surg - TSS states patient is appropriate for inpatient care - discharge today   Chronic Kidney Disease (CKD) - Cr Improved with hydration  Diabetes Type 2, uncontrolled - CBG - blood sugars have been well controlled - will continue to monitor and add ISS if needed  Thrombocytopenia - Improving  Anemia - improved today  Urinary Retention - resolved  Discharge Instructions  Discharge Instructions    Call MD for:  difficulty breathing, headache or visual disturbances    Complete by:  As directed   Call MD for:  extreme fatigue    Complete by:  As directed   Call MD for:  hives    Complete by:  As directed   Call MD for:  persistant nausea and vomiting    Complete by:  As directed   Call MD for:  redness, tenderness, or signs of infection (pain, swelling, redness, odor or green/yellow discharge around incision site)    Complete by:  As directed   Call MD for:  severe uncontrolled pain    Complete by:  As directed   Call MD for:  temperature >100.4    Complete by:  As directed   Diet general    Complete by:  As directed   Increase activity slowly    Complete by:  As directed       Medication List    STOP taking these medications   amLODipine 5 MG tablet Commonly known as:  NORVASC   buPROPion 150 MG 24 hr  tablet Commonly known as:  WELLBUTRIN XL   buPROPion 300 MG 24 hr tablet Commonly known as:  WELLBUTRIN XL   clonazePAM 2 MG tablet Commonly known as:  KLONOPIN   divalproex 500 MG 24 hr tablet Commonly known as:  DEPAKOTE ER   eszopiclone 3 MG Tabs Generic drug:  Eszopiclone   mirtazapine 45 MG tablet Commonly known as:  REMERON   NOVOLOG FLEXPEN 100 UNIT/ML FlexPen Generic drug:  insulin aspart   polyethylene glycol packet Commonly known as:  MIRALAX / GLYCOLAX   QUEtiapine 400 MG tablet Commonly known as:  SEROQUEL    sildenafil 20 MG tablet Commonly known as:  REVATIO   testosterone cypionate 200 MG/ML injection Commonly known as:  DEPOTESTOSTERONE CYPIONATE   TRUVADA 200-300 MG tablet Generic drug:  emtricitabine-tenofovir     TAKE these medications   DULCOLAX 10 MG suppository Generic drug:  bisacodyl Place 10 mg rectally as needed for moderate constipation.   feeding supplement (GLUCERNA SHAKE) Liqd Take 237 mLs by mouth daily.   losartan-hydrochlorothiazide 100-12.5 MG tablet Commonly known as:  HYZAAR Take 1 tablet by mouth daily.   multivitamin with minerals Tabs tablet Take 1 tablet by mouth every morning.       Allergies  Allergen Reactions  . Vicodin [Hydrocodone-Acetaminophen] Itching    Consultations:  TTS   Procedures/Studies: Dg Chest Portable 1 View  Result Date: 12/25/2015 CLINICAL DATA:  Overdose, Klonopin and Seroquel. Altered mental status. EXAM: PORTABLE CHEST 1 VIEW COMPARISON:  05/11/2015 FINDINGS: Low lung volumes are present, causing crowding of the pulmonary vasculature. The patient is rotated to the left on today's radiograph, reducing diagnostic sensitivity and specificity. Accounting for the very low lung volumes, the vague perihilar densities are ascribed to vasculature. There may be a small component of basilar and perihilar atelectasis. No definite signs of aspiration pneumonitis although surveillance may be warranted. Thoracic spondylosis. Heart size within normal limits for projection. IMPRESSION: 1. Very low lung volumes. No definite airspace opacity aside from some mild perihilar and basilar atelectasis. Electronically Signed   By: Van Clines M.D.   On: 12/25/2015 23:38       Subjective: Patient doing well.  Voices many questions about testosterone treatment that he previously received from his primary doctor.  Wondering about whether or not he will restart that medication when he is discharged.  Also voices that he would like to stay with  his previous counselor even at time of admission to new facility.  Discharge Exam: Vitals:   12/31/15 1946 01/01/16 0556  BP:  111/60  Pulse:  71  Resp:  16  Temp: 98.1 F (36.7 C) 97.9 F (36.6 C)   Vitals:   12/31/15 0800 12/31/15 1600 12/31/15 1946 01/01/16 0556  BP:    111/60  Pulse: 73   71  Resp: 15   16  Temp:  (!) 96.9 F (36.1 C) 98.1 F (36.7 C) 97.9 F (36.6 C)  TempSrc:  Oral Oral Oral  SpO2: 94%   95%  Weight:    90.2 kg (198 lb 13.7 oz)  Height:        General: Pt is alert, awake, not in acute distress Cardiovascular: RRR, S1/S2 +, no rubs, no gallops Respiratory: CTA bilaterally, no wheezing, no rhonchi Abdominal: Soft, NT, ND, bowel sounds + Extremities: no edema, no cyanosis    The results of significant diagnostics from this hospitalization (including imaging, microbiology, ancillary and laboratory) are listed below for reference.     Microbiology: Recent  Results (from the past 240 hour(s))  MRSA PCR Screening     Status: None   Collection Time: 12/26/15  6:43 AM  Result Value Ref Range Status   MRSA by PCR NEGATIVE NEGATIVE Final    Comment:        The GeneXpert MRSA Assay (FDA approved for NASAL specimens only), is one component of a comprehensive MRSA colonization surveillance program. It is not intended to diagnose MRSA infection nor to guide or monitor treatment for MRSA infections.      Labs: BNP (last 3 results) No results for input(s): BNP in the last 8760 hours. Basic Metabolic Panel:  Recent Labs Lab 12/25/15 2254 12/26/15 0812 12/27/15 0356 12/29/15 0858  NA 136  --  140 139  K 3.2*  --  4.2 3.9  CL 102  --  111 111  CO2 25  --  23 22  GLUCOSE 131*  --  107* 87  BUN 26*  --  32* 30*  CREATININE 3.02* 2.76* 2.73* 2.15*  CALCIUM 8.8*  --  8.6* 8.7*   Liver Function Tests:  Recent Labs Lab 12/25/15 2254 12/27/15 0356  AST 50* 36  ALT 47 35  ALKPHOS 75 59  BILITOT 0.8 0.9  PROT 6.9 6.2*  ALBUMIN 3.6 3.0*    No results for input(s): LIPASE, AMYLASE in the last 168 hours. No results for input(s): AMMONIA in the last 168 hours. CBC:  Recent Labs Lab 12/25/15 2254 12/26/15 0812 12/27/15 0356 12/28/15 0432 12/30/15 0854  WBC 8.2 6.7 6.6 5.3 5.5  NEUTROABS 6.2  --   --  3.6 3.6  HGB 12.7* 12.3* 11.2* 10.9* 12.0*  HCT 36.8* 36.1* 33.8* 32.7* 35.8*  MCV 98.7 100.0 104.6* 101.9* 99.2  PLT 76* 72* 69* 79* 144*   Cardiac Enzymes: No results for input(s): CKTOTAL, CKMB, CKMBINDEX, TROPONINI in the last 168 hours. BNP: Invalid input(s): POCBNP CBG:  Recent Labs Lab 12/29/15 1615 12/30/15 0819  GLUCAP 80 86   D-Dimer No results for input(s): DDIMER in the last 72 hours. Hgb A1c No results for input(s): HGBA1C in the last 72 hours. Lipid Profile No results for input(s): CHOL, HDL, LDLCALC, TRIG, CHOLHDL, LDLDIRECT in the last 72 hours. Thyroid function studies No results for input(s): TSH, T4TOTAL, T3FREE, THYROIDAB in the last 72 hours.  Invalid input(s): FREET3 Anemia work up No results for input(s): VITAMINB12, FOLATE, FERRITIN, TIBC, IRON, RETICCTPCT in the last 72 hours. Urinalysis    Component Value Date/Time   COLORURINE YELLOW 10/03/2015 1427   APPEARANCEUR CLEAR 10/03/2015 1427   LABSPEC 1.012 10/03/2015 1427   PHURINE 7.5 10/03/2015 1427   GLUCOSEU 500 (A) 10/03/2015 1427   GLUCOSEU >=1000 (A) 04/18/2015 1516   HGBUR TRACE (A) 10/03/2015 1427   BILIRUBINUR NEGATIVE 10/03/2015 1427   KETONESUR NEGATIVE 10/03/2015 1427   PROTEINUR 30 (A) 10/03/2015 1427   UROBILINOGEN 0.2 04/18/2015 1516   NITRITE NEGATIVE 10/03/2015 1427   LEUKOCYTESUR NEGATIVE 10/03/2015 1427   Sepsis Labs Invalid input(s): PROCALCITONIN,  WBC,  LACTICIDVEN Microbiology Recent Results (from the past 240 hour(s))  MRSA PCR Screening     Status: None   Collection Time: 12/26/15  6:43 AM  Result Value Ref Range Status   MRSA by PCR NEGATIVE NEGATIVE Final    Comment:        The GeneXpert  MRSA Assay (FDA approved for NASAL specimens only), is one component of a comprehensive MRSA colonization surveillance program. It is not intended to diagnose MRSA infection nor  to guide or monitor treatment for MRSA infections.      Time coordinating discharge: Over 30 minutes  SIGNED:   Newman Pies, MD  [facility] 01/01/2016, 11:35 AM Pager [phone] If 7PM-7AM, please contact night-coverage [email or web address] Password [email or web address]

## 2016-01-01 NOTE — Evaluation (Signed)
Physical Therapy Evaluation Patient Details Name: [name] MRN: [id number] DOB: 05-18-1965 Today's Date: 01/01/2016   History of Present Illness  50 y.o. male with medical history significant of bipolar, DM, PTSD, CKD was found sitting in his car right after ingesting over a 100 clonazepam pills (2mg  each), tramadol about 10 pills, and a couple of vicodin.  His story has changed and been inconsistent so dont really know accurately what he took.  He was given narcan on arrival with minimal response.  Over the last 5 hours in the ED he has become more and more drowsy.  He responds to my loud voice, is able to produce a strong cough although with much coaching and is answering questions however unsure the accuracy of the answers.  Pt says he was trying to kill himself.  Pt referred for admission for multiple drug overdose.  PMH: ADD, Anxiety, arthritis - hip, bipolar disorder, CKD stage 3, elevated creatinine, depression, DM, HTN, hypogonadism, liver fatty degeneration, PTSD, social anxiety disorder, back surgery, R closed reduction Metacarpal with percutaneous pinning, lumbar surgery, R THA in 2015, and L THA in 2017.    Clinical Impression  Pt received in bed, sitter present, and pt was agreeable to PT evaluation.  Pt expressed that he normally will use a cane occasionally, and his sister assists him frequently with IADL's such as laundry, and groceries.  He is able to dress himself most of the time, and his sister assists him with bathing.  Pt expressed feeling like "a loser" because he was having difficulty catching on to his new job at a [employer].  Pt states that they told him that he had 2 weeks to catch on, and if he couldn't, then he would be let go.  Pt also expressed feeling like a burden to his sister and brother in law.    At this point, pt required RW for ambulation due to unsteadiness, and frequency of falls at home.  Pt ambulated 255ft with 2 minor LOB which were corrected with the RW.   Pt continues to be a high risk for falls due to decreased gait speed, as well as poor step through gait pattern.  He would benefit from continued skilled PT with HHPT vs OPPT pending availability of transportation.  Encouraged pt to use his RW until he is able to regain his strength.     Follow Up Recommendations Home health PT;Outpatient PT (depending on pt's ability to find transportation. )    Equipment Recommendations  Other (comment) (Pt educated on need to use a RW when ambulating at this point to reduce his risk of falling. )    Recommendations for Other Services       Precautions / Restrictions Precautions Precautions: Fall Precaution Comments: Pt expresses a vertigo issue since his L THA (Jan 2017).  Pt states that he falls at least 2x's per day - on the front steps due to no rails.  He states that the falls happen because he expresses a visual disturbance and states that the steps look 3D.  Restrictions Weight Bearing Restrictions: No      Mobility  Bed Mobility Overal bed mobility: Modified Independent             General bed mobility comments: HOB raised.    Transfers Overall transfer level: Needs assistance Equipment used: Rolling walker (2 wheeled) Transfers: Sit to/from Stand Sit to Stand: Min guard            Ambulation/Gait Ambulation/Gait  assistance: Min guard Ambulation Distance (Feet): 250 Feet Assistive device: Rolling walker (2 wheeled) Gait Pattern/deviations: Step-to pattern;Trunk flexed;Narrow base of support;Decreased stride length   Gait velocity interpretation: <1.8 ft/sec, indicative of risk for recurrent falls General Gait Details: Pt demonstrated 2 minor LOB and was able to use the RW to assist with correcting his balance.    Stairs            Wheelchair Mobility    Modified Rankin (Stroke Patients Only)       Balance Overall balance assessment: Needs assistance Sitting-balance support: Bilateral upper extremity  supported Sitting balance-Leahy Scale: Good     Standing balance support: Bilateral upper extremity supported Standing balance-Leahy Scale: Fair                               Pertinent Vitals/Pain Pain Assessment: 0-10 Pain Score: 6  Pain Location: hip - has had both replaced.   Pain Descriptors / Indicators: Sharp Pain Intervention(s): Limited activity within patient's tolerance;Monitored during session    Home Living   Living Arrangements: Other relatives (sister and her husband.  ) Available Help at Discharge: Family;Available PRN/intermittently Type of Home: House Home Access: Stairs to enter   [employer] of Steps: 2 front, and side 1, back 2 Home Layout: Two level Home Equipment: Cane - single point;Walker - 2 wheels;Bedside commode (1/2 in shoe lift)      Prior Function     Gait / Transfers Assistance Needed: When walking long walks he uses the walking stick if it will be over 1/4 mile.    ADL's / Homemaking Assistance Needed: Sister washes his clothes, and sometimes helps pick out outfits to match.  Occasionally she will assist with getting UE's through the arm holes.  Sister helps with bathing.          Hand Dominance   Dominant Hand: Right    Extremity/Trunk Assessment   Upper Extremity Assessment: Overall WFL for tasks assessed           Lower Extremity Assessment: Generalized weakness (Pt expressed that he has diminished sensation in B LE's since his hip replacements. )         Communication   Communication: No difficulties  Cognition Arousal/Alertness: Awake/alert Behavior During Therapy: WFL for tasks assessed/performed Overall Cognitive Status: Within Functional Limits for tasks assessed                      General Comments      Exercises        Assessment/Plan    PT Assessment Patient needs continued PT services  PT Diagnosis Abnormality of gait;Generalized weakness   PT Problem List Decreased  activity tolerance;Decreased balance;Decreased strength;Decreased safety awareness  PT Treatment Interventions DME instruction;Gait training;Functional mobility training;Stair training;Therapeutic activities;Therapeutic exercise;Balance training;Patient/family education   PT Goals (Current goals can be found in the Care Plan section) Acute Rehab PT Goals Patient Stated Goal: Pt wants to be independent and not rely on his sister and brother in law for assistance.   PT Goal Formulation: With patient Time For Goal Achievement: 01/08/16 Potential to Achieve Goals: Fair    Frequency Min 2X/week   Barriers to discharge Decreased caregiver support;Inaccessible home environment steps to get in/out of the house, and lives with sister and brother in law, who are not present all the time.     Co-evaluation  End of Session Equipment Utilized During Treatment: Gait belt;Other (comment) (RW) Activity Tolerance: Patient tolerated treatment well Patient left: in chair;with nursing/sitter in room           Time: 0952-1025 PT Time Calculation (min) (ACUTE ONLY): 33 min   Charges:   PT Evaluation $PT Eval Low Complexity: 1 Procedure PT Treatments $Gait Training: 8-22 mins   PT G Codes:        Beth Tajay Muzzy, PT, DPT X: [phone]

## 2016-01-01 NOTE — Telephone Encounter (Signed)
Pharmacy called to check up on this, she said he was taking 1.5 ml up until we send in this new one. Please advise.

## 2016-01-01 NOTE — Telephone Encounter (Signed)
Patient is confused on which testosterone script he needs to be taking. . . Dr. Reola Calkins script or Dr. Rosario Adie script.  Please call back at [facility]. Room 303.

## 2016-01-01 NOTE — Clinical Social Work Note (Signed)
Pt accepted at [facility]. CSW notified pt, RN, and MD. RN given number for report and aware to call [facility] when ready for transport. Pt plans to notify family. Pt is voluntary.   Benay Pike, [place]

## 2016-01-01 NOTE — Telephone Encounter (Signed)
I'm not sure. Pls hold off changes until ROV THx

## 2016-01-01 NOTE — Progress Notes (Signed)
Patient being discharged to [facility] unit,report called,and given to [facility] LPN. Vital signs stable. Safety sitter at bedside..Awaiting for transportation.Marland Kitchen

## 2016-01-02 NOTE — Telephone Encounter (Signed)
Faxed form back to [facility] w/MD response...Johny Chess

## 2016-01-02 NOTE — Progress Notes (Signed)
Transportation service here to take pt.  Unable to find pt's belongings.  Room checked thoroughly.  ICU called, but no answer. AC called.  AC said she would check ICU for pt's belongings.  AC reported that ICU said pt's sister retrieved his belongings.  Sister called to verify and said she was never given any belongings. Pt concerned over necklace with sentimental value.  Pt left at 2110.     At 2300 asked ER if they had pt's belongings.  Was told yes they did.  Asked ER to call Ac.  Attempted to contact sister to let her know to come pick up pt's belongings.  No answer.  Will call again in the AM

## 2016-01-05 DIAGNOSIS — F5001 Anorexia nervosa, restricting type: Secondary | ICD-10-CM | POA: Insufficient documentation

## 2016-01-11 ENCOUNTER — Ambulatory Visit: Payer: Self-pay | Admitting: Psychology

## 2016-01-16 ENCOUNTER — Encounter: Payer: Self-pay | Admitting: Internal Medicine

## 2016-01-18 ENCOUNTER — Other Ambulatory Visit: Payer: Self-pay | Admitting: *Deleted

## 2016-01-18 MED ORDER — TESTOSTERONE CYPIONATE 200 MG/ML IM SOLN: 75.0000 mg | INTRAMUSCULAR | 5 refills | Status: DC

## 2016-01-23 ENCOUNTER — Ambulatory Visit: Payer: Self-pay | Admitting: Psychology

## 2016-01-29 ENCOUNTER — Encounter: Payer: Self-pay | Admitting: Internal Medicine

## 2016-01-29 ENCOUNTER — Ambulatory Visit (INDEPENDENT_AMBULATORY_CARE_PROVIDER_SITE_OTHER): Payer: BLUE CROSS/BLUE SHIELD | Admitting: Internal Medicine

## 2016-01-29 DIAGNOSIS — R634 Abnormal weight loss: Secondary | ICD-10-CM

## 2016-01-29 DIAGNOSIS — E291 Testicular hypofunction: Secondary | ICD-10-CM | POA: Diagnosis not present

## 2016-01-29 DIAGNOSIS — R453 Demoralization and apathy: Secondary | ICD-10-CM | POA: Diagnosis not present

## 2016-01-29 DIAGNOSIS — F3175 Bipolar disorder, in partial remission, most recent episode depressed: Secondary | ICD-10-CM

## 2016-01-29 DIAGNOSIS — T50902S Poisoning by unspecified drugs, medicaments and biological substances, intentional self-harm, sequela: Secondary | ICD-10-CM

## 2016-01-29 DIAGNOSIS — F411 Generalized anxiety disorder: Secondary | ICD-10-CM

## 2016-01-29 MED ORDER — TESTOSTERONE CYPIONATE 200 MG/ML IM SOLN: 100.0000 mg | INTRAMUSCULAR | 5 refills | Status: DC

## 2016-01-29 MED ORDER — TESTOSTERONE CYPIONATE 200 MG/ML IM SOLN: 75.0000 mg | INTRAMUSCULAR | 5 refills | Status: DC

## 2016-01-29 NOTE — Progress Notes (Signed)
Subjective:  Patient ID: [name], male    DOB: [birth year]  Age: 50 y.o. MRN: [id number]  CC: No chief complaint on file.   HPI ZAKKARY THIBAULT presents for a post-hospital f/u - he had a suicidal attempt/overdose in Sept 2017 (9/5-9/12 adm) w/subsequet am to [facility] - d/c on 9/22. He is seeing a psychiatrist now Donnal Moat, NP. Sister has cancer  Outpatient Medications Prior to Visit  Medication Sig Dispense Refill  . bisacodyl (DULCOLAX) 10 MG suppository Place 10 mg rectally as needed for moderate constipation.    . feeding supplement, GLUCERNA SHAKE, (GLUCERNA SHAKE) LIQD Take 237 mLs by mouth daily. 30 Can 0  . losartan-hydrochlorothiazide (HYZAAR) 100-12.5 MG tablet Take 1 tablet by mouth daily.    . Multiple Vitamin (MULTIVITAMIN WITH MINERALS) TABS Take 1 tablet by mouth every morning.    . testosterone cypionate (DEPOTESTOSTERONE CYPIONATE) 200 MG/ML injection Inject 0.38 mLs (76 mg total) into the muscle once a week. 10 mL 5   No facility-administered medications prior to visit.     ROS Review of Systems  Constitutional: Negative for appetite change, fatigue and unexpected weight change.  HENT: Negative for congestion, nosebleeds, sneezing, sore throat and trouble swallowing.   Eyes: Negative for itching and visual disturbance.  Respiratory: Negative for cough.   Cardiovascular: Negative for chest pain, palpitations and leg swelling.  Gastrointestinal: Negative for abdominal distention, blood in stool, diarrhea and nausea.  Genitourinary: Negative for frequency and hematuria.  Musculoskeletal: Negative for back pain, gait problem, joint swelling and neck pain.  Skin: Negative for rash.  Neurological: Positive for tremors. Negative for dizziness, speech difficulty and weakness.  Psychiatric/Behavioral: Positive for decreased concentration, dysphoric mood and sleep disturbance. Negative for agitation and suicidal ideas. The patient is nervous/anxious.       Objective:  BP (!) 150/90   Pulse (!) 107   Wt 202 lb (91.6 kg)   SpO2 97%   BMI 25.25 kg/m   BP Readings from Last 3 Encounters:  01/29/16 (!) 150/90  01/01/16 111/60  11/26/15 139/85    Wt Readings from Last 3 Encounters:  01/29/16 202 lb (91.6 kg)  01/01/16 198 lb 13.7 oz (90.2 kg)  11/26/15 211 lb (95.7 kg)    Physical Exam  Constitutional: He is oriented to person, place, and time. He appears well-developed. No distress.  NAD  HENT:  Mouth/Throat: Oropharynx is clear and moist.  Eyes: Conjunctivae are normal. Pupils are equal, round, and reactive to light.  Neck: Normal range of motion. No JVD present. No thyromegaly present.  Cardiovascular: Normal rate, regular rhythm, normal heart sounds and intact distal pulses.  Exam reveals no gallop and no friction rub.   No murmur heard. Pulmonary/Chest: Effort normal and breath sounds normal. No respiratory distress. He has no wheezes. He has no rales. He exhibits no tenderness.  Abdominal: Soft. Bowel sounds are normal. He exhibits no distension and no mass. There is no tenderness. There is no rebound and no guarding.  Musculoskeletal: Normal range of motion. He exhibits no edema.  Lymphadenopathy:    He has no cervical adenopathy.  Neurological: He is alert and oriented to person, place, and time. He has normal reflexes. No cranial nerve deficit. He exhibits normal muscle tone. He displays a negative Romberg sign. Coordination abnormal. Gait normal.  Skin: Skin is warm and dry. No rash noted.  Psychiatric: He has a normal mood and affect. His behavior is normal. Judgment and thought content normal.  Lab Results  Component Value Date   WBC 5.5 12/30/2015   HGB 12.0 (L) 12/30/2015   HCT 35.8 (L) 12/30/2015   PLT 144 (L) 12/30/2015   GLUCOSE 87 12/29/2015   CHOL 90 10/03/2015   TRIG 205 (H) 10/03/2015   HDL 21 (L) 10/03/2015   LDLDIRECT 102.0 04/18/2015   LDLCALC 28 10/03/2015   ALT 35 12/27/2015   AST 36  12/27/2015   NA 139 12/29/2015   K 3.9 12/29/2015   CL 111 12/29/2015   CREATININE 2.15 (H) 12/29/2015   BUN 30 (H) 12/29/2015   CO2 22 12/29/2015   TSH 1.55 04/18/2015   PSA 2.46 04/18/2015   INR 1.27 10/02/2015   HGBA1C 4.9 10/03/2015   MICROALBUR 15.8 (H) 04/18/2015    Dg Chest Portable 1 View  Result Date: 12/25/2015 CLINICAL DATA:  Overdose, Klonopin and Seroquel. Altered mental status. EXAM: PORTABLE CHEST 1 VIEW COMPARISON:  05/11/2015 FINDINGS: Low lung volumes are present, causing crowding of the pulmonary vasculature. The patient is rotated to the left on today's radiograph, reducing diagnostic sensitivity and specificity. Accounting for the very low lung volumes, the vague perihilar densities are ascribed to vasculature. There may be a small component of basilar and perihilar atelectasis. No definite signs of aspiration pneumonitis although surveillance may be warranted. Thoracic spondylosis. Heart size within normal limits for projection. IMPRESSION: 1. Very low lung volumes. No definite airspace opacity aside from some mild perihilar and basilar atelectasis. Electronically Signed   By: Van Clines M.D.   On: 12/25/2015 23:38    Assessment & Plan:   There are no diagnoses linked to this encounter. I have discontinued Mr. Hannibal's BuPROPion HCl ER (XL). I am also having him maintain his multivitamin with minerals, feeding supplement (GLUCERNA SHAKE), losartan-hydrochlorothiazide, bisacodyl, testosterone cypionate, divalproex, QUEtiapine, propranolol, amLODipine, insulin lispro, lidocaine, mirtazapine, QUEtiapine, clonazePAM, and buPROPion.  Meds ordered this encounter  Medications  . divalproex (DEPAKOTE) 500 MG DR tablet    Sig: Take 3 tablets by mouth daily.  . QUEtiapine (SEROQUEL) 25 MG tablet    Sig: Take by mouth at bedtime.  . propranolol (INDERAL) 20 MG tablet    Sig: Take by mouth 2 (two) times daily.  Marland Kitchen amLODipine (NORVASC) 5 MG tablet    Sig: Take 1 tablet  by mouth daily.  Marland Kitchen DISCONTD: BuPROPion HCl ER, XL, 450 MG TB24    Sig: Take 1 tablet by mouth daily.  . insulin lispro (HUMALOG) 100 UNIT/ML injection    Sig: Inject into the skin.  Marland Kitchen lidocaine (LIDODERM) 5 %    Sig: Place onto the skin daily.  . mirtazapine (REMERON) 45 MG tablet    Sig: Take 1 tablet by mouth daily.    Refill:  10  . QUEtiapine (SEROQUEL) 400 MG tablet    Sig: Take 1 tablet by mouth at bedtime.    Refill:  0  . clonazePAM (KLONOPIN) 2 MG tablet    Refill:  0  . buPROPion (WELLBUTRIN XL) 150 MG 24 hr tablet    Refill:  2     Follow-up: No Follow-up on file.  [name], MD

## 2016-01-29 NOTE — Assessment & Plan Note (Signed)
Wt Readings from Last 3 Encounters:  01/29/16 202 lb (91.6 kg)  01/01/16 198 lb 13.7 oz (90.2 kg)  11/26/15 211 lb (95.7 kg)

## 2016-01-29 NOTE — Assessment & Plan Note (Signed)
Chronic - severe  On Depakote, Seroquel

## 2016-01-29 NOTE — Assessment & Plan Note (Signed)
On Testosterone IM 75 mg weekly

## 2016-01-29 NOTE — Assessment & Plan Note (Signed)
[name] had a suicidal attempt/overdose in Sept 2017 (9/5-9/12 adm) w/subsequet am to [facility] - d/c on 9/22. He is seeing a psychiatrist now Donnal Moat, NP.

## 2016-01-29 NOTE — Progress Notes (Signed)
Pre visit review using our clinic review tool, if applicable. No additional management support is needed unless otherwise documented below in the visit note. 

## 2016-01-29 NOTE — Assessment & Plan Note (Addendum)
Two attempts over years 01/06/16 [name] had a suicidal attempt/overdose in Sept 2017 (9/5-9/12 adm) w/subsequet am to [facility] - d/c on 9/22. He is seeing a psychiatrist now Donnal Moat, NP.

## 2016-01-29 NOTE — Assessment & Plan Note (Signed)
Callan had a suicidal attempt/overdose in Sept 2017 (9/5-9/12 adm) w/subsequet am to [facility] - d/c on 9/22. He is seeing a psychiatrist now Donnal Moat, NP.

## 2016-01-29 NOTE — Assessment & Plan Note (Signed)
Off Metformin  On Novolog

## 2016-02-12 ENCOUNTER — Telehealth: Payer: Self-pay | Admitting: Emergency Medicine

## 2016-02-12 NOTE — Telephone Encounter (Signed)
[facility] called and has questions about the pts testosterone cypionate (DEPOTESTOSTERONE CYPIONATE) 200 MG/ML injection. The dosage states 0.5 mls once a week but pt is stating its suppose to be 1 ml a week. Please call them back to confirm correct dosage. Thanks.

## 2016-02-12 NOTE — Telephone Encounter (Signed)
[name], I think we have cleared it already. The pt was given a corrected Rx last OV Thx

## 2016-02-18 ENCOUNTER — Other Ambulatory Visit: Payer: Self-pay | Admitting: *Deleted

## 2016-02-18 MED ORDER — TRUVADA 200-300 MG PO TABS: 1.0000 | ORAL_TABLET | Freq: Every day | ORAL | 5 refills | Status: DC

## 2016-03-19 ENCOUNTER — Ambulatory Visit (INDEPENDENT_AMBULATORY_CARE_PROVIDER_SITE_OTHER): Payer: BLUE CROSS/BLUE SHIELD | Admitting: Internal Medicine

## 2016-03-19 ENCOUNTER — Other Ambulatory Visit (INDEPENDENT_AMBULATORY_CARE_PROVIDER_SITE_OTHER): Payer: BLUE CROSS/BLUE SHIELD

## 2016-03-19 ENCOUNTER — Encounter: Payer: Self-pay | Admitting: Internal Medicine

## 2016-03-19 DIAGNOSIS — I10 Essential (primary) hypertension: Secondary | ICD-10-CM | POA: Diagnosis not present

## 2016-03-19 DIAGNOSIS — R634 Abnormal weight loss: Secondary | ICD-10-CM | POA: Diagnosis not present

## 2016-03-19 DIAGNOSIS — F3175 Bipolar disorder, in partial remission, most recent episode depressed: Secondary | ICD-10-CM

## 2016-03-19 DIAGNOSIS — E1165 Type 2 diabetes mellitus with hyperglycemia: Secondary | ICD-10-CM

## 2016-03-19 DIAGNOSIS — E1122 Type 2 diabetes mellitus with diabetic chronic kidney disease: Secondary | ICD-10-CM

## 2016-03-19 DIAGNOSIS — E291 Testicular hypofunction: Secondary | ICD-10-CM | POA: Diagnosis not present

## 2016-03-19 DIAGNOSIS — N183 Chronic kidney disease, stage 3 (moderate): Secondary | ICD-10-CM

## 2016-03-19 DIAGNOSIS — M5440 Lumbago with sciatica, unspecified side: Secondary | ICD-10-CM | POA: Diagnosis not present

## 2016-03-19 DIAGNOSIS — F3341 Major depressive disorder, recurrent, in partial remission: Secondary | ICD-10-CM

## 2016-03-19 DIAGNOSIS — IMO0002 Reserved for concepts with insufficient information to code with codable children: Secondary | ICD-10-CM

## 2016-03-19 DIAGNOSIS — T50912S Poisoning by multiple unspecified drugs, medicaments and biological substances, intentional self-harm, sequela: Secondary | ICD-10-CM

## 2016-03-19 DIAGNOSIS — T50902S Poisoning by unspecified drugs, medicaments and biological substances, intentional self-harm, sequela: Secondary | ICD-10-CM

## 2016-03-19 DIAGNOSIS — E785 Hyperlipidemia, unspecified: Secondary | ICD-10-CM

## 2016-03-19 DIAGNOSIS — G8929 Other chronic pain: Secondary | ICD-10-CM

## 2016-03-19 DIAGNOSIS — R29898 Other symptoms and signs involving the musculoskeletal system: Secondary | ICD-10-CM

## 2016-03-19 LAB — TESTOSTERONE: Testosterone: 496.86 ng/dL (ref 300.00–890.00)

## 2016-03-19 LAB — BASIC METABOLIC PANEL
BUN: 31 mg/dL — ABNORMAL HIGH (ref 6–23)
CALCIUM: 9.6 mg/dL (ref 8.4–10.5)
CO2: 28 meq/L (ref 19–32)
CREATININE: 3.12 mg/dL — AB (ref 0.40–1.50)
Chloride: 104 mEq/L (ref 96–112)
GFR: 22.78 mL/min — AB (ref >60.00)
Glucose, Bld: 153 mg/dL — ABNORMAL HIGH (ref 70–99)
Potassium: 4.6 mEq/L (ref 3.5–5.1)
SODIUM: 139 meq/L (ref 135–145)

## 2016-03-19 LAB — CBC WITH DIFFERENTIAL/PLATELET
Basophils Absolute: 0 K/uL (ref 0.0–0.1)
Basophils Relative: 0.4 % (ref 0.0–3.0)
Eosinophils Absolute: 0.1 K/uL (ref 0.0–0.7)
Eosinophils Relative: 0.8 % (ref 0.0–5.0)
HCT: 42.6 % (ref 39.0–52.0)
Hemoglobin: 14.4 g/dL (ref 13.0–17.0)
Lymphocytes Relative: 38.8 % (ref 12.0–46.0)
Lymphs Abs: 2.6 K/uL (ref 0.7–4.0)
MCHC: 33.9 g/dL (ref 30.0–36.0)
MCV: 95.2 fl (ref 78.0–100.0)
Monocytes Absolute: 0.9 K/uL (ref 0.1–1.0)
Monocytes Relative: 13.1 % — ABNORMAL HIGH (ref 3.0–12.0)
Neutro Abs: 3.2 K/uL (ref 1.4–7.7)
Neutrophils Relative %: 46.9 % (ref 43.0–77.0)
Platelets: 157 K/uL (ref 150.0–400.0)
RBC: 4.48 Mil/uL (ref 4.22–5.81)
RDW: 14.3 % (ref 11.5–15.5)
WBC: 6.8 K/uL (ref 4.0–10.5)

## 2016-03-19 LAB — HEPATIC FUNCTION PANEL
ALT: 97 U/L — ABNORMAL HIGH (ref 0–53)
AST: 80 U/L — ABNORMAL HIGH (ref 0–37)
Albumin: 4.6 g/dL (ref 3.5–5.2)
Alkaline Phosphatase: 87 U/L (ref 39–117)
Bilirubin, Direct: 0.1 mg/dL (ref 0.0–0.3)
Total Bilirubin: 0.7 mg/dL (ref 0.2–1.2)
Total Protein: 7.3 g/dL (ref 6.0–8.3)

## 2016-03-19 LAB — PSA: PSA: 1.52 ng/mL (ref 0.10–4.00)

## 2016-03-19 LAB — HEMOGLOBIN A1C: Hgb A1c MFr Bld: 7.2 % — ABNORMAL HIGH (ref 4.6–6.5)

## 2016-03-19 MED ORDER — TESTOSTERONE CYPIONATE 200 MG/ML IM SOLN: 100.0000 mg | INTRAMUSCULAR | 5 refills | Status: DC

## 2016-03-19 MED ORDER — TESTOSTERONE CYPIONATE 200 MG/ML IM SOLN: 150.0000 mg | INTRAMUSCULAR | 5 refills | Status: DC

## 2016-03-19 NOTE — Assessment & Plan Note (Signed)
Bipolar depression On therapy

## 2016-03-19 NOTE — Progress Notes (Signed)
Pre visit review using our clinic review tool, if applicable. No additional management support is needed unless otherwise documented below in the visit note. 

## 2016-03-19 NOTE — Assessment & Plan Note (Signed)
On Testosterone 150 mg/wk - best results Labs

## 2016-03-19 NOTE — Assessment & Plan Note (Signed)
Wt Readings from Last 3 Encounters:  03/19/16 218 lb (98.9 kg)  01/29/16 202 lb (91.6 kg)  01/01/16 198 lb 13.7 oz (90.2 kg)

## 2016-03-19 NOTE — Assessment & Plan Note (Signed)
The pt applied for disability Discussed

## 2016-03-19 NOTE — Assessment & Plan Note (Signed)
Metformin, Novolog

## 2016-03-19 NOTE — Assessment & Plan Note (Signed)
On Depakote, Seroquel, Wellbutrin, Remeron

## 2016-03-19 NOTE — Progress Notes (Signed)
Subjective:  Patient ID: [name], male    DOB: 1966-02-20  Age: 50 y.o. MRN: [id number]  CC: No chief complaint on file.   HPI [name] presents for DM, HTN, depression, OA f/u  Outpatient Medications Prior to Visit  Medication Sig Dispense Refill  . amLODipine (NORVASC) 5 MG tablet Take 1 tablet by mouth daily.    . bisacodyl (DULCOLAX) 10 MG suppository Place 10 mg rectally as needed for moderate constipation.    Marland Kitchen buPROPion (WELLBUTRIN XL) 150 MG 24 hr tablet   2  . clonazePAM (KLONOPIN) 2 MG tablet   0  . divalproex (DEPAKOTE) 500 MG DR tablet Take 3 tablets by mouth daily.    . feeding supplement, GLUCERNA SHAKE, (GLUCERNA SHAKE) LIQD Take 237 mLs by mouth daily. 30 Can 0  . insulin lispro (HUMALOG) 100 UNIT/ML injection Inject into the skin.    Marland Kitchen losartan-hydrochlorothiazide (HYZAAR) 100-12.5 MG tablet Take 1 tablet by mouth daily.    . mirtazapine (REMERON) 45 MG tablet Take 1 tablet by mouth daily.  10  . Multiple Vitamin (MULTIVITAMIN WITH MINERALS) TABS Take 1 tablet by mouth every morning.    . propranolol (INDERAL) 20 MG tablet Take by mouth 2 (two) times daily.    . QUEtiapine (SEROQUEL) 400 MG tablet Take 1 tablet by mouth at bedtime.  0  . testosterone cypionate (DEPOTESTOSTERONE CYPIONATE) 200 MG/ML injection Inject 0.5 mLs (100 mg total) into the muscle once a week. 10 mL 5  . TRUVADA 200-300 MG tablet Take 1 tablet by mouth daily. Take 1 tablet by mouth daily 30 tablet 5   No facility-administered medications prior to visit.     ROS Review of Systems  Constitutional: Negative for appetite change, fatigue and unexpected weight change.  HENT: Negative for congestion, nosebleeds, sneezing, sore throat and trouble swallowing.   Eyes: Negative for itching and visual disturbance.  Respiratory: Negative for cough and shortness of breath.   Cardiovascular: Negative for chest pain, palpitations and leg swelling.  Gastrointestinal: Negative for abdominal  distention, blood in stool, diarrhea and nausea.  Genitourinary: Negative for frequency and hematuria.  Musculoskeletal: Negative for back pain, gait problem, joint swelling and neck pain.  Skin: Negative for rash.  Neurological: Negative for dizziness, tremors, speech difficulty and weakness.  Psychiatric/Behavioral: Positive for decreased concentration, dysphoric mood, self-injury and suicidal ideas. Negative for agitation and sleep disturbance. The patient is hyperactive. The patient is not nervous/anxious.   no suicidal plans  Objective:  BP (!) 148/90   Pulse 73   Wt 218 lb (98.9 kg)   SpO2 100%   BMI 27.25 kg/m   BP Readings from Last 3 Encounters:  03/19/16 (!) 148/90  01/29/16 (!) 150/90  01/01/16 111/60    Wt Readings from Last 3 Encounters:  03/19/16 218 lb (98.9 kg)  01/29/16 202 lb (91.6 kg)  01/01/16 198 lb 13.7 oz (90.2 kg)    Physical Exam  Constitutional: He is oriented to person, place, and time. He appears well-developed. No distress.  NAD  HENT:  Mouth/Throat: Oropharynx is clear and moist.  Eyes: Conjunctivae are normal. Pupils are equal, round, and reactive to light.  Neck: Normal range of motion. No JVD present. No thyromegaly present.  Cardiovascular: Normal rate, regular rhythm, normal heart sounds and intact distal pulses.  Exam reveals no gallop and no friction rub.   No murmur heard. Pulmonary/Chest: Effort normal and breath sounds normal. No respiratory distress. He has no wheezes. He has  no rales. He exhibits no tenderness.  Abdominal: Soft. Bowel sounds are normal. He exhibits no distension and no mass. There is no tenderness. There is no rebound and no guarding.  Musculoskeletal: Normal range of motion. He exhibits tenderness. He exhibits no edema.  Lymphadenopathy:    He has no cervical adenopathy.  Neurological: He is alert and oriented to person, place, and time. He has normal reflexes. No cranial nerve deficit. He exhibits normal muscle  tone. He displays a negative Romberg sign. Coordination abnormal. Gait normal.  Skin: Skin is warm and dry. No rash noted.  Psychiatric: His behavior is normal. Judgment and thought content normal.  Cane Sad  Lab Results  Component Value Date   WBC 5.5 12/30/2015   HGB 12.0 (L) 12/30/2015   HCT 35.8 (L) 12/30/2015   PLT 144 (L) 12/30/2015   GLUCOSE 87 12/29/2015   CHOL 90 10/03/2015   TRIG 205 (H) 10/03/2015   HDL 21 (L) 10/03/2015   LDLDIRECT 102.0 04/18/2015   LDLCALC 28 10/03/2015   ALT 35 12/27/2015   AST 36 12/27/2015   NA 139 12/29/2015   K 3.9 12/29/2015   CL 111 12/29/2015   CREATININE 2.15 (H) 12/29/2015   BUN 30 (H) 12/29/2015   CO2 22 12/29/2015   TSH 1.55 04/18/2015   PSA 2.46 04/18/2015   INR 1.27 10/02/2015   HGBA1C 4.9 10/03/2015   MICROALBUR 15.8 (H) 04/18/2015    Dg Chest Portable 1 View  Result Date: 12/25/2015 CLINICAL DATA:  Overdose, Klonopin and Seroquel. Altered mental status. EXAM: PORTABLE CHEST 1 VIEW COMPARISON:  05/11/2015 FINDINGS: Low lung volumes are present, causing crowding of the pulmonary vasculature. The patient is rotated to the left on today's radiograph, reducing diagnostic sensitivity and specificity. Accounting for the very low lung volumes, the vague perihilar densities are ascribed to vasculature. There may be a small component of basilar and perihilar atelectasis. No definite signs of aspiration pneumonitis although surveillance may be warranted. Thoracic spondylosis. Heart size within normal limits for projection. IMPRESSION: 1. Very low lung volumes. No definite airspace opacity aside from some mild perihilar and basilar atelectasis. Electronically Signed   By: Van Clines M.D.   On: 12/25/2015 23:38    Assessment & Plan:   There are no diagnoses linked to this encounter. I am having Mr. Hesch maintain his multivitamin with minerals, feeding supplement (GLUCERNA SHAKE), losartan-hydrochlorothiazide, bisacodyl, divalproex,  propranolol, amLODipine, insulin lispro, mirtazapine, QUEtiapine, clonazePAM, buPROPion, testosterone cypionate, TRUVADA, traZODone, and gabapentin.  Meds ordered this encounter  Medications  . traZODone (DESYREL) 100 MG tablet    Sig: Take 1 tablet by mouth at bedtime.    Refill:  1  . gabapentin (NEURONTIN) 600 MG tablet    Sig: Take 2 tablets by mouth 3 (three) times daily.    Refill:  0     Follow-up: No Follow-up on file.  [name], MD

## 2016-03-19 NOTE — Assessment & Plan Note (Signed)
Discussed.

## 2016-03-19 NOTE — Assessment & Plan Note (Signed)
Losartan HCT 

## 2016-03-19 NOTE — Assessment & Plan Note (Signed)
No relapse Grieving his cat's death 08-Aug-2015

## 2016-03-19 NOTE — Assessment & Plan Note (Signed)
Chronic.  Using a cane °

## 2016-03-20 ENCOUNTER — Other Ambulatory Visit (INDEPENDENT_AMBULATORY_CARE_PROVIDER_SITE_OTHER): Payer: BLUE CROSS/BLUE SHIELD

## 2016-03-20 DIAGNOSIS — I10 Essential (primary) hypertension: Secondary | ICD-10-CM

## 2016-03-20 DIAGNOSIS — G8929 Other chronic pain: Secondary | ICD-10-CM

## 2016-03-20 DIAGNOSIS — IMO0002 Reserved for concepts with insufficient information to code with codable children: Secondary | ICD-10-CM

## 2016-03-20 DIAGNOSIS — E1122 Type 2 diabetes mellitus with diabetic chronic kidney disease: Secondary | ICD-10-CM

## 2016-03-20 DIAGNOSIS — N183 Chronic kidney disease, stage 3 (moderate): Secondary | ICD-10-CM

## 2016-03-20 DIAGNOSIS — E1165 Type 2 diabetes mellitus with hyperglycemia: Secondary | ICD-10-CM

## 2016-03-20 DIAGNOSIS — E291 Testicular hypofunction: Secondary | ICD-10-CM

## 2016-03-20 DIAGNOSIS — M5440 Lumbago with sciatica, unspecified side: Secondary | ICD-10-CM

## 2016-03-20 LAB — URINALYSIS, ROUTINE W REFLEX MICROSCOPIC
Bilirubin Urine: NEGATIVE
Hgb urine dipstick: NEGATIVE
KETONES UR: NEGATIVE
Leukocytes, UA: NEGATIVE
Nitrite: NEGATIVE
PH: 6 (ref 5.0–8.0)
RBC / HPF: NONE SEEN (ref 0–?)
SPECIFIC GRAVITY, URINE: 1.025 (ref 1.000–1.030)
TOTAL PROTEIN, URINE-UPE24: 30 — AB
UROBILINOGEN UA: 0.2 (ref 0.0–1.0)
WBC, UA: NONE SEEN (ref 0–?)

## 2016-03-31 ENCOUNTER — Encounter (HOSPITAL_COMMUNITY): Payer: Self-pay | Admitting: *Deleted

## 2016-03-31 ENCOUNTER — Emergency Department (HOSPITAL_COMMUNITY)
Admission: EM | Admit: 2016-03-31 | Discharge: 2016-03-31 | Disposition: A | Discharge status: Home / Self Care | Payer: BLUE CROSS/BLUE SHIELD | Attending: Emergency Medicine | Admitting: Emergency Medicine

## 2016-03-31 DIAGNOSIS — Z5321 Procedure and treatment not carried out due to patient leaving prior to being seen by health care provider: Secondary | ICD-10-CM | POA: Insufficient documentation

## 2016-03-31 DIAGNOSIS — L509 Urticaria, unspecified: Secondary | ICD-10-CM | POA: Insufficient documentation

## 2016-03-31 LAB — CBC
HEMATOCRIT: 38.8 % — AB (ref 39.0–52.0)
HEMOGLOBIN: 13.7 g/dL (ref 13.0–17.0)
MCH: 32.5 pg (ref 26.0–34.0)
MCHC: 35.3 g/dL (ref 30.0–36.0)
MCV: 92.2 fL (ref 78.0–100.0)
Platelets: 114 10*3/uL — ABNORMAL LOW (ref 150–400)
RBC: 4.21 MIL/uL — AB (ref 4.22–5.81)
RDW: 13.4 % (ref 11.5–15.5)
WBC: 7.2 10*3/uL (ref 4.0–10.5)

## 2016-03-31 LAB — ACETAMINOPHEN LEVEL

## 2016-03-31 LAB — COMPREHENSIVE METABOLIC PANEL
ALBUMIN: 4.2 g/dL (ref 3.5–5.0)
ALT: 31 U/L (ref 17–63)
ANION GAP: 9 (ref 5–15)
AST: 34 U/L (ref 15–41)
Alkaline Phosphatase: 98 U/L (ref 38–126)
BUN: 16 mg/dL (ref 6–20)
CO2: 27 mmol/L (ref 22–32)
Calcium: 9.1 mg/dL (ref 8.9–10.3)
Chloride: 100 mmol/L — ABNORMAL LOW (ref 101–111)
Creatinine, Ser: 2.75 mg/dL — ABNORMAL HIGH (ref 0.61–1.24)
GFR calc non Af Amer: 26 mL/min — ABNORMAL LOW (ref >60)
GFR, EST AFRICAN AMERICAN: 30 mL/min — AB (ref >60)
Glucose, Bld: 228 mg/dL — ABNORMAL HIGH (ref 65–99)
POTASSIUM: 3.4 mmol/L — AB (ref 3.5–5.1)
SODIUM: 136 mmol/L (ref 135–145)
Total Bilirubin: 0.9 mg/dL (ref 0.3–1.2)
Total Protein: 7.2 g/dL (ref 6.5–8.1)

## 2016-03-31 LAB — SALICYLATE LEVEL: Salicylate Lvl: <7 mg/dL (ref 2.8–30.0)

## 2016-03-31 NOTE — ED Notes (Signed)
Pt last seen walking out of the ED, saying he is leaving and going to [facility] to be seen. Pt walking towards the main hospital. ED Director talking with patient, pt unable to be convinced to stay.

## 2016-03-31 NOTE — ED Triage Notes (Signed)
Yesterday am pt took "some extended form of vicodin that I didn't realize that was vicodin" for a tooth ache.  Now he has hives all over body.  Airway patent.  No respiratory distress.  Took benadryl for hives with no relief.

## 2016-04-15 ENCOUNTER — Encounter (HOSPITAL_COMMUNITY): Payer: Self-pay

## 2016-04-15 ENCOUNTER — Emergency Department (HOSPITAL_COMMUNITY): Payer: BLUE CROSS/BLUE SHIELD

## 2016-04-15 ENCOUNTER — Emergency Department (HOSPITAL_COMMUNITY)
Admission: EM | Admit: 2016-04-15 | Discharge: 2016-04-15 | Disposition: A | Discharge status: Home / Self Care | Payer: BLUE CROSS/BLUE SHIELD | Attending: Emergency Medicine | Admitting: Emergency Medicine

## 2016-04-15 DIAGNOSIS — I129 Hypertensive chronic kidney disease with stage 1 through stage 4 chronic kidney disease, or unspecified chronic kidney disease: Secondary | ICD-10-CM | POA: Insufficient documentation

## 2016-04-15 DIAGNOSIS — N183 Chronic kidney disease, stage 3 (moderate): Secondary | ICD-10-CM | POA: Insufficient documentation

## 2016-04-15 DIAGNOSIS — Z96643 Presence of artificial hip joint, bilateral: Secondary | ICD-10-CM | POA: Insufficient documentation

## 2016-04-15 DIAGNOSIS — W1809XA Striking against other object with subsequent fall, initial encounter: Secondary | ICD-10-CM | POA: Diagnosis not present

## 2016-04-15 DIAGNOSIS — Z794 Long term (current) use of insulin: Secondary | ICD-10-CM | POA: Insufficient documentation

## 2016-04-15 DIAGNOSIS — Y939 Activity, unspecified: Secondary | ICD-10-CM | POA: Insufficient documentation

## 2016-04-15 DIAGNOSIS — Z87891 Personal history of nicotine dependence: Secondary | ICD-10-CM | POA: Diagnosis not present

## 2016-04-15 DIAGNOSIS — Y929 Unspecified place or not applicable: Secondary | ICD-10-CM | POA: Insufficient documentation

## 2016-04-15 DIAGNOSIS — S39012A Strain of muscle, fascia and tendon of lower back, initial encounter: Secondary | ICD-10-CM | POA: Diagnosis not present

## 2016-04-15 DIAGNOSIS — Y999 Unspecified external cause status: Secondary | ICD-10-CM | POA: Insufficient documentation

## 2016-04-15 DIAGNOSIS — S3992XA Unspecified injury of lower back, initial encounter: Secondary | ICD-10-CM | POA: Diagnosis present

## 2016-04-15 DIAGNOSIS — E1122 Type 2 diabetes mellitus with diabetic chronic kidney disease: Secondary | ICD-10-CM | POA: Insufficient documentation

## 2016-04-15 DIAGNOSIS — W19XXXA Unspecified fall, initial encounter: Secondary | ICD-10-CM

## 2016-04-15 MED ORDER — OXYCODONE-ACETAMINOPHEN 5-325 MG PO TABS
1.0000 | ORAL_TABLET | Freq: Once | ORAL | Status: AC
  Administered 2016-04-15: 1 via ORAL
  Filled 2016-04-15: qty 1

## 2016-04-15 MED ORDER — METHOCARBAMOL 500 MG PO TABS: 500.0000 mg | ORAL_TABLET | Freq: Two times a day (BID) | ORAL | 0 refills | Status: DC

## 2016-04-15 NOTE — ED Notes (Signed)
Patient taken to xray.

## 2016-04-15 NOTE — Discharge Instructions (Signed)
Your x-ray was negative for any fractures today. This is likely a lumbar sprain. However U should follow up with her back surgeon tomorrow for further workup and more imaging as necessary. I will give you a prescription for Robaxin which is a muscle relaxer. Please take as prescribed. Return to the ED if your symptoms worsen. Or if you or unable to urinate, Lose control of your bowels, developed numbness in your groin, fevers or for any other reason.

## 2016-04-15 NOTE — ED Provider Notes (Signed)
[facility] DEPT Provider Note   CSN: [id number] Arrival date & time: 04/15/16  1456     History   Chief Complaint Chief Complaint  Patient presents with  . Back Pain    HPI [name] is a 50 y.o. male.  50 yo Caucasian male with pmh sig for osteoarthritis, chronic back pain, anxiety, depression that presents to the ED today with complaint of low back pain. Patient had a mechanical fall approximately 5 days ago. He fell and hit a stool on his lower back. The patient does have a history of low back surgery due to herniated disc. Last surgery was in 2006. He does wear a back brace at baseline. Patient has been ambulatory since the event. Patient states that moving makes the pain worse. The patient had left over Vicodin at home for the pain that helped slightly. However patient states that he is allergic to Vicodin and had a reaction after taking it over the weekend. He has not tried any Tylenol or ibuprofen because patient states it messes up his stomach and liver. Patient reports neuropathy of lower extremities at baseline. Denies any worsening of his neuropathy after the fall. Denies any urinary retention, loss of bowel, saddle paresthesias. Patient states he has been seen multiple times for back pain. Patient is ambulatory. The patient's back surgeon is Dr. Vertell Limber. Try to call for an appointment today but the office was closed. Denies hitting his head, LOC. Patient denies any headache, vision changes, fever, chills, neck pain, history of IV drug use, history of cancer or any other associated symptoms.      Past Medical History:  Diagnosis Date  . ADD (attention deficit disorder)   . Anxiety   . Arthritis    right hip  . Bipolar 1 disorder ([place])   . Blood in urine   . CKD (chronic kidney disease), stage III   . Creatinine elevation   . Depression    bipolar [facility]  . Diabetes mellitus without complication ([place])   . Family history of anesthesia complication    pt  is unsure , but pt father may have been difficult to arouse   . History of kidney stones   . Hypertension   . Hypogonadism male   . Liver fatty degeneration   . Microscopic hematuria    hereditary s/p Urology eval  . Pneumonia 10-2012  . Polysubstance dependence, non-opioid, in remission ([place])    remote  . PTSD (post-traumatic stress disorder)    SOCIAL ANXIETY DISORDER     Patient Active Problem List   Diagnosis Date Noted  . Altered mental status   . Multiple drug overdose 12/27/2015  . Suicidal overdose ([place]) 12/26/2015  . Suicide attempt by multiple drug overdose ([place]) 12/26/2015  . Ataxia 10/14/2015  . Slurring of speech 10/14/2015  . Leg weakness 10/03/2015  . CRF (chronic renal failure) 10/03/2015  . Hypokalemia 10/03/2015  . Hemiparesis ([place])   . Seizures ([place])   . Primary osteoarthritis of left hip 05/22/2015  . Exposure to potentially hazardous body fluids 02/12/2015  . Loss of weight 09/04/2014  . Dyslipidemia 05/31/2014  . Left hip pain 02/13/2014  . Insomnia 11/11/2013  . Degenerative joint disease (DJD) of hip 08/16/2013  . Acute respiratory failure ([place]) 11/02/2012  . Pneumonia, organism unspecified([place]) 11/02/2012  . Pleural effusion 11/02/2012  . Hypoxemia 11/02/2012  . HCAP (healthcare-associated pneumonia) 10/31/2012  . Anemia 10/31/2012  . Osteoarthritis of right hip 11/28/2011  . Right hip pain 07/22/2011  .  Bilateral hip pain 07/22/2011  . Low back pain 05/20/2011  . Testicular pain, right 05/20/2011  . Erectile dysfunction 02/17/2011  . Constipation - functional 02/17/2011  . Diabetes type 2, uncontrolled ([place]) 11/06/2010  . Hypogonadism male 05/20/2010  . TOBACCO USER 05/20/2010  . Demoralization and apathy 05/20/2010  . Bipolar disorder ([place]) 01/18/2010  . Depression 01/18/2010  . MICROSCOPIC HEMATURIA 01/18/2010  . SHOULDER PAIN 01/18/2010  . Anxiety state 12/05/2006  . Essential hypertension 12/05/2006    Past Surgical History:    Procedure Laterality Date  . BACK SURGERY    . CLOSED REDUCTION METACARPAL WITH PERCUTANEOUS PINNING Right   . LUMBAR DISC SURGERY    . TONSILLECTOMY    . TOTAL HIP ARTHROPLASTY Right 08/16/2013   Procedure: TOTAL HIP ARTHROPLASTY ANTERIOR APPROACH;  Surgeon: Hessie Dibble, MD;  Location: [facility];  Service: Orthopedics;  Laterality: Right;  . TOTAL HIP ARTHROPLASTY Left 05/22/2015   Procedure: TOTAL HIP ARTHROPLASTY ANTERIOR APPROACH;  Surgeon: Melrose Nakayama, MD;  Location: [facility];  Service: Orthopedics;  Laterality: Left;       Home Medications    Prior to Admission medications   Medication Sig Start Date End Date Taking? Authorizing Provider  amLODipine (NORVASC) 5 MG tablet Take 1 tablet by mouth daily. 01/11/16 01/10/17  Historical Provider, MD  bisacodyl (DULCOLAX) 10 MG suppository Place 10 mg rectally as needed for moderate constipation.    Historical Provider, MD  buPROPion (WELLBUTRIN XL) 150 MG 24 hr tablet  11/26/15   Historical Provider, MD  clonazePAM Bobbye Charleston) 2 MG tablet  11/26/15   Historical Provider, MD  divalproex (DEPAKOTE) 500 MG DR tablet Take 3 tablets by mouth daily. 01/11/16 01/10/17  Historical Provider, MD  feeding supplement, GLUCERNA SHAKE, (GLUCERNA SHAKE) LIQD Take 237 mLs by mouth daily. 10/05/15   Florencia Reasons, MD  gabapentin (NEURONTIN) 600 MG tablet Take 2 tablets by mouth 3 (three) times daily. 03/17/16   Historical Provider, MD  insulin lispro (HUMALOG) 100 UNIT/ML injection Inject into the skin. 01/11/16 01/10/17  Historical Provider, MD  losartan-hydrochlorothiazide (HYZAAR) 100-12.5 MG tablet Take 1 tablet by mouth daily.    Historical Provider, MD  mirtazapine (REMERON) 45 MG tablet Take 1 tablet by mouth daily. 01/15/16   Historical Provider, MD  Multiple Vitamin (MULTIVITAMIN WITH MINERALS) TABS Take 1 tablet by mouth every morning.    Historical Provider, MD  propranolol (INDERAL) 20 MG tablet Take by mouth 2 (two) times daily. 01/11/16 01/10/17  Historical  Provider, MD  QUEtiapine (SEROQUEL) 400 MG tablet Take 1 tablet by mouth at bedtime. 11/26/15   Historical Provider, MD  testosterone cypionate (DEPOTESTOSTERONE CYPIONATE) 200 MG/ML injection Inject 0.75 mLs (150 mg total) into the muscle once a week. 03/19/16   Evie Lacks Plotnikov, MD  traZODone (DESYREL) 100 MG tablet Take 1 tablet by mouth at bedtime. 03/17/16   Historical Provider, MD  TRUVADA 200-300 MG tablet Take 1 tablet by mouth daily. Take 1 tablet by mouth daily 02/18/16   [name], MD    Family History Family History  Problem Relation Age of Onset  . Diabetes Father   . Cancer Mother     died of melanoma with mets  . Cervical cancer Sister   . Diabetes Sister   . Other Neg Hx     hypogonadism    Social History Social History  Substance Use Topics  . Smoking status: Former Smoker    Years: 0.00    Types: Cigarettes  Quit date: 02/19/2014  . Smokeless tobacco: Never Used  . Alcohol use No     Allergies   Vicodin [hydrocodone-acetaminophen]   Review of Systems Review of Systems  Constitutional: Negative for chills and fever.  HENT: Negative for congestion, ear pain, rhinorrhea and sore throat.   Eyes: Negative for pain and discharge.  Respiratory: Negative for cough and shortness of breath.   Cardiovascular: Negative for chest pain and palpitations.  Gastrointestinal: Negative for abdominal pain, diarrhea, nausea and vomiting.  Genitourinary: Negative for flank pain, frequency, hematuria and urgency.  Musculoskeletal: Positive for back pain. Negative for gait problem, myalgias and neck pain.  Skin: Negative.   Neurological: Negative for dizziness, syncope, weakness, light-headedness, numbness and headaches.  All other systems reviewed and are negative.    Physical Exam Updated Vital Signs BP 148/97 (BP Location: Left Arm)   Pulse 79   Temp 98.3 F (36.8 C) (Oral)   Resp 18   SpO2 95%   Physical Exam  Constitutional: He is oriented to  person, place, and time. He appears well-developed and well-nourished. No distress.  HENT:  Head: Normocephalic and atraumatic.  Eyes: Conjunctivae and EOM are normal. Pupils are equal, round, and reactive to light. Right eye exhibits no discharge. Left eye exhibits no discharge. No scleral icterus.  Neck: Normal range of motion. Neck supple.  Patient with no midline C-spine tenderness. Full range of motion. No deformities or step-offs noted.  Cardiovascular: Intact distal pulses.   Pulmonary/Chest: No respiratory distress.  Musculoskeletal: Normal range of motion.  Patient with no midline T-spine tenderness. No deformities or step-offs noted. Patient does have midline L-spine tenderness. No deformities or step-offs noted. Patient also has bilateral paraspinal tenderness. Strength is 5 out of 5 in lower extremities. Sensation intact. Cap refill normal. DP are 2+ bilaterally. Patient with full range of motion of his lower extremities. Limited range of motion of lumbar spine due to pain. Patient is ambulatory with normal gait.  Neurological: He is alert and oriented to person, place, and time.   Sensation intact. Strength 5/5 in all extremities. Reflexes 2+ and symmetric at biceps, triceps, knees, and ankles. Rapid alternating movement. Romberg is absent. Posture and gait normal.   Skin: Skin is warm and dry. Capillary refill takes less than 2 seconds. No pallor.  Nursing note and vitals reviewed.    ED Treatments / Results  Labs (all labs ordered are listed, but only abnormal results are displayed) Labs Reviewed - No data to display  EKG  EKG Interpretation None       Radiology Dg Lumbar Spine Complete  Result Date: 04/15/2016 CLINICAL DATA:  Status post multiple falls, with lower back pain. Initial encounter. EXAM: LUMBAR SPINE - COMPLETE 4+ VIEW COMPARISON:  MRI of the lumbar spine performed 10/03/2015, and lumbar spine radiographs performed 12/06/2014 FINDINGS: There is no  evidence of fracture or subluxation. The patient is status post lumbar spinal fusion at L4-L5. Vertebral bodies demonstrate normal height and alignment. Intervertebral disc spaces are preserved. The visualized neural foramina are grossly unremarkable in appearance. The visualized bowel gas pattern is unremarkable in appearance; air and stool are noted within the colon. The sacroiliac joints are within normal limits. IMPRESSION: No evidence of fracture or subluxation along the lumbar spine. Electronically Signed   By: Garald Balding M.D.   On: 04/15/2016 17:33    Procedures Procedures (including critical care time)  Medications Ordered in ED Medications - No data to display   Initial Impression / Assessment  and Plan / ED Course  I have reviewed the triage vital signs and the nursing notes.  Pertinent labs & imaging results that were available during my care of the patient were reviewed by me and considered in my medical decision making (see chart for details).  Clinical Course   Patient with back pain after a mechanical fall. Chronic back pain at baseline including sciatica.  No neurological deficits and normal neuro exam.  Patient can walk with steady gait but states is painful.  No loss of bowels or urinary rentention. No concern for cauda equina.  No fever, night sweats, weight loss, h/o cancer, IVDU. Xray negative for acute fracture.  Patient states that muscle relaxer work better than pain medicine. States when he had his hip surgery they gave him robaxin which helped. RICE protocol and pain medicine indicated and discussed with patient. Patient given referral to neurosurgeon. States he will call his neurosurgeon office tomorrow for an appointment. Pt is hemodynamically stable, in NAD, & able to ambulate in the ED. Pain has been managed & has no complaints prior to dc. Pt is comfortable with above plan and is stable for discharge at this time. All questions were answered prior to disposition.  Strict return precautions for f/u to the ED were discussed.     Final Clinical Impressions(s) / ED Diagnoses   Final diagnoses:  Strain of lumbar region, initial encounter  Fall, initial encounter    New Prescriptions Discharge Medication List as of 04/15/2016  5:48 PM    START taking these medications   Details  methocarbamol (ROBAXIN) 500 MG tablet Take 1 tablet (500 mg total) by mouth 2 (two) times daily., Starting Tue 04/15/2016, Print         Doristine Devoid, PA-C 04/15/16 1838    Lajean Saver, MD 04/15/16 [id number]

## 2016-04-15 NOTE — ED Triage Notes (Signed)
Pt presents for evaluation of lumbar back pain since Friday when he had a mechanical fall over stool. Pt. Reports hx of slipped disc in past and states feet are numb from previous injury. Pt states back pain worse with exertion. Pt. Ambulatory in triage.

## 2016-04-16 ENCOUNTER — Telehealth: Payer: Self-pay | Admitting: *Deleted

## 2016-04-16 MED ORDER — CLONAZEPAM 2 MG PO TABS: 2.0000 mg | ORAL_TABLET | Freq: Two times a day (BID) | ORAL | 0 refills | Status: DC | PRN

## 2016-04-16 NOTE — Telephone Encounter (Signed)
Klonopin Done hardcopy to [employer] for change humalog to novolog flex pen but would need to clarify dosing as the current humalog listed on EMR does not include instructions

## 2016-04-16 NOTE — Telephone Encounter (Signed)
Rec'd fax pt requesting refill on Clonazepam 2 mg last filled 11/26/15, and refill on novolog flexpen. Per chart pt is suppose to be taking Humalog faxed bsck denied pls advise on control since MD is out of office...Johny Chess

## 2016-04-17 NOTE — Telephone Encounter (Signed)
Faxed script for clonazepam back to [facility]...Johny Chess

## 2016-04-19 MED ORDER — INSULIN PEN NEEDLE 31G X 8 MM MISC: 3 refills | Status: DC

## 2016-04-19 NOTE — Addendum Note (Signed)
Addended by: Aviva Signs M on: 04/19/2016 02:03 PM   Modules accepted: Orders

## 2016-04-30 ENCOUNTER — Ambulatory Visit: Payer: Self-pay | Admitting: Internal Medicine

## 2016-05-21 ENCOUNTER — Other Ambulatory Visit: Payer: Self-pay | Admitting: Internal Medicine

## 2016-05-22 ENCOUNTER — Other Ambulatory Visit: Payer: Self-pay | Admitting: Internal Medicine

## 2016-05-27 ENCOUNTER — Other Ambulatory Visit: Payer: Self-pay | Admitting: Internal Medicine

## 2016-05-28 NOTE — Telephone Encounter (Signed)
Routing to dr plotnikov, please advise, thanks 

## 2016-06-02 ENCOUNTER — Encounter: Payer: Self-pay | Admitting: Internal Medicine

## 2016-06-02 ENCOUNTER — Other Ambulatory Visit (INDEPENDENT_AMBULATORY_CARE_PROVIDER_SITE_OTHER): Payer: BLUE CROSS/BLUE SHIELD

## 2016-06-02 ENCOUNTER — Ambulatory Visit (INDEPENDENT_AMBULATORY_CARE_PROVIDER_SITE_OTHER): Payer: BLUE CROSS/BLUE SHIELD | Admitting: Internal Medicine

## 2016-06-02 DIAGNOSIS — E1165 Type 2 diabetes mellitus with hyperglycemia: Secondary | ICD-10-CM

## 2016-06-02 DIAGNOSIS — M160 Bilateral primary osteoarthritis of hip: Secondary | ICD-10-CM

## 2016-06-02 DIAGNOSIS — R453 Demoralization and apathy: Secondary | ICD-10-CM | POA: Diagnosis not present

## 2016-06-02 DIAGNOSIS — I10 Essential (primary) hypertension: Secondary | ICD-10-CM

## 2016-06-02 DIAGNOSIS — E1122 Type 2 diabetes mellitus with diabetic chronic kidney disease: Secondary | ICD-10-CM

## 2016-06-02 DIAGNOSIS — N183 Chronic kidney disease, stage 3 (moderate): Secondary | ICD-10-CM

## 2016-06-02 DIAGNOSIS — F3175 Bipolar disorder, in partial remission, most recent episode depressed: Secondary | ICD-10-CM

## 2016-06-02 DIAGNOSIS — IMO0002 Reserved for concepts with insufficient information to code with codable children: Secondary | ICD-10-CM

## 2016-06-02 LAB — URINALYSIS
Bilirubin Urine: NEGATIVE
Ketones, ur: NEGATIVE
Leukocytes, UA: NEGATIVE
NITRITE: NEGATIVE
PH: 6 (ref 5.0–8.0)
SPECIFIC GRAVITY, URINE: 1.015 (ref 1.000–1.030)
Urine Glucose: >=1000 — AB
Urobilinogen, UA: 0.2 (ref 0.0–1.0)

## 2016-06-02 LAB — CBC WITH DIFFERENTIAL/PLATELET
BASOS PCT: 0.6 % (ref 0.0–3.0)
Basophils Absolute: 0 10*3/uL (ref 0.0–0.1)
EOS PCT: 1.7 % (ref 0.0–5.0)
Eosinophils Absolute: 0.1 10*3/uL (ref 0.0–0.7)
HCT: 44.6 % (ref 39.0–52.0)
HEMOGLOBIN: 15.7 g/dL (ref 13.0–17.0)
LYMPHS ABS: 2.1 10*3/uL (ref 0.7–4.0)
Lymphocytes Relative: 32.2 % (ref 12.0–46.0)
MCHC: 35.2 g/dL (ref 30.0–36.0)
MCV: 92.6 fl (ref 78.0–100.0)
Monocytes Absolute: 0.6 10*3/uL (ref 0.1–1.0)
Monocytes Relative: 8.7 % (ref 3.0–12.0)
NEUTROS PCT: 56.8 % (ref 43.0–77.0)
Neutro Abs: 3.7 10*3/uL (ref 1.4–7.7)
Platelets: 155 10*3/uL (ref 150.0–400.0)
RBC: 4.82 Mil/uL (ref 4.22–5.81)
RDW: 13.8 % (ref 11.5–15.5)
WBC: 6.5 10*3/uL (ref 4.0–10.5)

## 2016-06-02 LAB — BASIC METABOLIC PANEL
BUN: 12 mg/dL (ref 6–23)
CALCIUM: 9.3 mg/dL (ref 8.4–10.5)
CO2: 28 mEq/L (ref 19–32)
CREATININE: 2.24 mg/dL — AB (ref 0.40–1.50)
Chloride: 97 mEq/L (ref 96–112)
GFR: 33.36 mL/min — AB (ref >60.00)
GLUCOSE: 409 mg/dL — AB (ref 70–99)
Potassium: 4.3 mEq/L (ref 3.5–5.1)
Sodium: 131 mEq/L — ABNORMAL LOW (ref 135–145)

## 2016-06-02 LAB — LDL CHOLESTEROL, DIRECT: Direct LDL: 72 mg/dL

## 2016-06-02 LAB — HEMOGLOBIN A1C: Hgb A1c MFr Bld: 9.4 % — ABNORMAL HIGH (ref 4.6–6.5)

## 2016-06-02 LAB — HEPATIC FUNCTION PANEL
ALBUMIN: 4.3 g/dL (ref 3.5–5.2)
ALT: 17 U/L (ref 0–53)
AST: 16 U/L (ref 0–37)
Alkaline Phosphatase: 99 U/L (ref 39–117)
BILIRUBIN DIRECT: 0.1 mg/dL (ref 0.0–0.3)
TOTAL PROTEIN: 7.3 g/dL (ref 6.0–8.3)
Total Bilirubin: 0.7 mg/dL (ref 0.2–1.2)

## 2016-06-02 LAB — LIPID PANEL
CHOLESTEROL: 224 mg/dL — AB (ref 0–200)
HDL: 32.3 mg/dL — AB (ref >39.00)
Total CHOL/HDL Ratio: 7

## 2016-06-02 LAB — TSH: TSH: 2.84 u[IU]/mL (ref 0.35–4.50)

## 2016-06-02 LAB — VITAMIN B12: Vitamin B-12: 293 pg/mL (ref 211–911)

## 2016-06-02 LAB — TESTOSTERONE: TESTOSTERONE: 643.45 ng/dL (ref 300.00–890.00)

## 2016-06-02 MED ORDER — CLONAZEPAM 2 MG PO TABS
2.0000 mg | ORAL_TABLET | Freq: Two times a day (BID) | ORAL | 5 refills | Status: DC | PRN
Start: 2016-06-02 — End: 2016-06-19

## 2016-06-02 MED ORDER — INSULIN LISPRO 100 UNIT/ML ~~LOC~~ SOLN: 10.0000 [IU] | Freq: Three times a day (TID) | SUBCUTANEOUS | 11 refills | Status: DC

## 2016-06-02 MED ORDER — PROPRANOLOL HCL 20 MG PO TABS: 20.0000 mg | ORAL_TABLET | Freq: Two times a day (BID) | ORAL | 11 refills | Status: DC

## 2016-06-02 MED ORDER — TRAZODONE HCL 150 MG PO TABS: 150.0000 mg | ORAL_TABLET | Freq: Every day | ORAL | 2 refills | Status: DC

## 2016-06-02 NOTE — Assessment & Plan Note (Addendum)
Novolog inj to re-start Chronic noncompliance w/meds due to financial issues

## 2016-06-02 NOTE — Progress Notes (Signed)
Subjective:  Patient ID: [name], male    DOB: 1965-09-05  Age: 51 y.o. MRN: [id number]  CC: No chief complaint on file.   HPI [name] presents for depression, DM, bipolar disorder, insomnia f/u. He applied for disability  Outpatient Medications Prior to Visit  Medication Sig Dispense Refill  . amLODipine (NORVASC) 5 MG tablet Take 1 tablet by mouth daily.    . bisacodyl (DULCOLAX) 10 MG suppository Place 10 mg rectally as needed for moderate constipation.    Marland Kitchen buPROPion (WELLBUTRIN XL) 150 MG 24 hr tablet   2  . clonazePAM (KLONOPIN) 2 MG tablet TAKE 1 TABLET BY MOUTH TWICE DAILY AS NEEDED FOR ANXIETY 60 tablet 1  . divalproex (DEPAKOTE) 500 MG DR tablet Take 3 tablets by mouth daily.    . feeding supplement, GLUCERNA SHAKE, (GLUCERNA SHAKE) LIQD Take 237 mLs by mouth daily. 30 Can 0  . gabapentin (NEURONTIN) 600 MG tablet Take 2 tablets by mouth 3 (three) times daily.  0  . insulin lispro (HUMALOG) 100 UNIT/ML injection Inject into the skin.    . Insulin Pen Needle (B-D ULTRAFINE III SHORT PEN) 31G X 8 MM MISC Use ad directed with insulin pens. 100 each 3  . losartan-hydrochlorothiazide (HYZAAR) 100-12.5 MG tablet Take 1 tablet by mouth daily.    . methocarbamol (ROBAXIN) 500 MG tablet Take 1 tablet (500 mg total) by mouth 2 (two) times daily. 10 tablet 0  . mirtazapine (REMERON) 45 MG tablet Take 1 tablet by mouth daily.  10  . Multiple Vitamin (MULTIVITAMIN WITH MINERALS) TABS Take 1 tablet by mouth every morning.    . propranolol (INDERAL) 20 MG tablet Take by mouth 2 (two) times daily.    . QUEtiapine (SEROQUEL) 400 MG tablet Take 1 tablet by mouth at bedtime.  0  . testosterone cypionate (DEPOTESTOSTERONE CYPIONATE) 200 MG/ML injection Inject 0.75 mLs (150 mg total) into the muscle once a week. 10 mL 5  . traZODone (DESYREL) 100 MG tablet Take 1 tablet by mouth at bedtime.  1  . TRUVADA 200-300 MG tablet Take 1 tablet by mouth daily. Take 1 tablet by mouth daily 30  tablet 5   No facility-administered medications prior to visit.     ROS Review of Systems  Constitutional: Negative for appetite change, fatigue and unexpected weight change.  HENT: Negative for congestion, nosebleeds, sneezing, sore throat and trouble swallowing.   Eyes: Negative for itching and visual disturbance.  Respiratory: Negative for cough.   Cardiovascular: Negative for chest pain, palpitations and leg swelling.  Gastrointestinal: Negative for abdominal distention, blood in stool, diarrhea and nausea.  Genitourinary: Negative for frequency and hematuria.  Musculoskeletal: Positive for arthralgias, back pain and gait problem. Negative for joint swelling and neck pain.  Skin: Negative for rash.  Neurological: Negative for dizziness, tremors, speech difficulty and weakness.  Psychiatric/Behavioral: Positive for decreased concentration and dysphoric mood. Negative for agitation, sleep disturbance and suicidal ideas. The patient is nervous/anxious.     Objective:  There were no vitals taken for this visit.  BP Readings from Last 3 Encounters:  04/15/16 148/97  03/31/16 115/81  03/19/16 (!) 148/90    Wt Readings from Last 3 Encounters:  03/31/16 210 lb (95.3 kg)  03/19/16 218 lb (98.9 kg)  01/29/16 202 lb (91.6 kg)    Physical Exam  Constitutional: He is oriented to person, place, and time. He appears well-developed. No distress.  NAD  HENT:  Mouth/Throat: Oropharynx is clear and  moist.  Eyes: Conjunctivae are normal. Pupils are equal, round, and reactive to light.  Neck: Normal range of motion. No JVD present. No thyromegaly present.  Cardiovascular: Normal rate, regular rhythm, normal heart sounds and intact distal pulses.  Exam reveals no gallop and no friction rub.   No murmur heard. Pulmonary/Chest: Effort normal and breath sounds normal. No respiratory distress. He has no wheezes. He has no rales. He exhibits no tenderness.  Abdominal: Soft. Bowel sounds are  normal. He exhibits no distension and no mass. There is no tenderness. There is no rebound and no guarding.  Musculoskeletal: Normal range of motion. He exhibits tenderness. He exhibits no edema.  Lymphadenopathy:    He has no cervical adenopathy.  Neurological: He is alert and oriented to person, place, and time. He has normal reflexes. No cranial nerve deficit. He exhibits normal muscle tone. He displays a negative Romberg sign. Coordination and gait normal.  Skin: Skin is warm and dry. No rash noted.  Psychiatric: He has a normal mood and affect. His behavior is normal. Judgment and thought content normal.    Lab Results  Component Value Date   WBC 7.2 03/31/2016   HGB 13.7 03/31/2016   HCT 38.8 (L) 03/31/2016   PLT 114 (L) 03/31/2016   GLUCOSE 228 (H) 03/31/2016   CHOL 90 10/03/2015   TRIG 205 (H) 10/03/2015   HDL 21 (L) 10/03/2015   LDLDIRECT 102.0 04/18/2015   LDLCALC 28 10/03/2015   ALT 31 03/31/2016   AST 34 03/31/2016   NA 136 03/31/2016   K 3.4 (L) 03/31/2016   CL 100 (L) 03/31/2016   CREATININE 2.75 (H) 03/31/2016   BUN 16 03/31/2016   CO2 27 03/31/2016   TSH 1.55 04/18/2015   PSA 1.52 03/19/2016   INR 1.27 10/02/2015   HGBA1C 7.2 (H) 03/19/2016   MICROALBUR 15.8 (H) 04/18/2015    Dg Lumbar Spine Complete  Result Date: 04/15/2016 CLINICAL DATA:  Status post multiple falls, with lower back pain. Initial encounter. EXAM: LUMBAR SPINE - COMPLETE 4+ VIEW COMPARISON:  MRI of the lumbar spine performed 10/03/2015, and lumbar spine radiographs performed 12/06/2014 FINDINGS: There is no evidence of fracture or subluxation. The patient is status post lumbar spinal fusion at L4-L5. Vertebral bodies demonstrate normal height and alignment. Intervertebral disc spaces are preserved. The visualized neural foramina are grossly unremarkable in appearance. The visualized bowel gas pattern is unremarkable in appearance; air and stool are noted within the colon. The sacroiliac joints  are within normal limits. IMPRESSION: No evidence of fracture or subluxation along the lumbar spine. Electronically Signed   By: Garald Balding M.D.   On: 04/15/2016 17:33    Assessment & Plan:   There are no diagnoses linked to this encounter. I am having Mr. Ow maintain his multivitamin with minerals, feeding supplement (GLUCERNA SHAKE), losartan-hydrochlorothiazide, bisacodyl, divalproex, propranolol, amLODipine, insulin lispro, mirtazapine, QUEtiapine, buPROPion, TRUVADA, traZODone, gabapentin, testosterone cypionate, methocarbamol, Insulin Pen Needle, and clonazePAM.  No orders of the defined types were placed in this encounter.    Follow-up: No Follow-up on file.  [name], MD

## 2016-06-02 NOTE — Assessment & Plan Note (Signed)
Increase Trazodone

## 2016-06-02 NOTE — Assessment & Plan Note (Signed)
Chronic noncompliance w/meds due to financial issues

## 2016-06-02 NOTE — Assessment & Plan Note (Signed)
Losartan HCT 

## 2016-06-02 NOTE — Assessment & Plan Note (Signed)
S/p B THR

## 2016-06-02 NOTE — Progress Notes (Signed)
Pre visit review using our clinic review tool, if applicable. No additional management support is needed unless otherwise documented below in the visit note. 

## 2016-06-03 NOTE — Telephone Encounter (Signed)
Do we have samples.  Patient states blood sugar was 400 and something yesterday.

## 2016-06-04 NOTE — Telephone Encounter (Signed)
Patient calling back in regard.  Blood sugar 480.

## 2016-06-06 ENCOUNTER — Telehealth: Payer: Self-pay | Admitting: Internal Medicine

## 2016-06-06 NOTE — Telephone Encounter (Signed)
Pt aware to pick up sample 

## 2016-06-06 NOTE — Telephone Encounter (Signed)
Samples placed up front--routing back to pete, please call patient and let him know he can pick up at front office, thanks

## 2016-06-06 NOTE — Telephone Encounter (Signed)
Pt called wondering if we can give him sample or starter kit for glucose meter (his last one broke and he is not working right now). Please call him back

## 2016-06-12 ENCOUNTER — Encounter: Payer: Self-pay | Admitting: Internal Medicine

## 2016-06-12 ENCOUNTER — Telehealth: Payer: Self-pay | Admitting: Internal Medicine

## 2016-06-12 NOTE — Telephone Encounter (Signed)
I have paperwork. I will call pt when I have had a chance to review with PCP.

## 2016-06-12 NOTE — Telephone Encounter (Signed)
Pt called to check on patient assistant paper work that drop off 06/09/2016. Please call pt

## 2016-06-15 ENCOUNTER — Encounter: Payer: Self-pay | Admitting: Internal Medicine

## 2016-06-16 ENCOUNTER — Telehealth: Payer: Self-pay | Admitting: Internal Medicine

## 2016-06-16 NOTE — Telephone Encounter (Signed)
I sent patient a my chart message responding to this same request.

## 2016-06-16 NOTE — Telephone Encounter (Signed)
Pt called stating patient assistant has to be fax from our office only in order for them to process it. Please give him a call once paper work has been fax.  He mention about handicap placard that need to be fill out and mail to him , please check.

## 2016-06-16 NOTE — Telephone Encounter (Signed)
Patient states not to worry about disability forms for right now until they send another request in.  States he is going through [employer].

## 2016-06-16 NOTE — Telephone Encounter (Signed)
Patient states disability did not receive forms from our office.  States he has to start the process over again.  Wants our office to be on the lookout for these forms.   Patient is also requesting call back in regard.

## 2016-06-17 ENCOUNTER — Telehealth: Payer: Self-pay | Admitting: Internal Medicine

## 2016-06-17 NOTE — Telephone Encounter (Signed)
Patient called in today stating that androgel and wellbutrin assistance forms were not completed correctly.  States company is missing hard scripts and signatures on forms.  States company is to fax what they need over.    Patient also stated that he had fallen several times today. I asked him if he thought this was his blood sugar but he believed it was not. States he had scratched up his knees and gotten blood all over the basement.  Also, stated that he had thought about suicide this morning.  Patient seemed to have slurred words and was jumping back and forth to different subjects.    Do we need to make appt for patient to come in?    Please follow up with patient.

## 2016-06-17 NOTE — Telephone Encounter (Signed)
Routing to dr plotnikov---please advise, thanks

## 2016-06-18 NOTE — Telephone Encounter (Signed)
Pls correct Thx

## 2016-06-19 ENCOUNTER — Telehealth: Payer: Self-pay | Admitting: Internal Medicine

## 2016-06-19 MED ORDER — BUPROPION HCL ER (XL) 150 MG PO TB24: 300.0000 mg | ORAL_TABLET | ORAL | 11 refills | Status: DC

## 2016-06-19 MED ORDER — TRAZODONE HCL 150 MG PO TABS: 150.0000 mg | ORAL_TABLET | Freq: Every day | ORAL | 11 refills | Status: DC

## 2016-06-19 MED ORDER — QUETIAPINE FUMARATE 400 MG PO TABS: 400.0000 mg | ORAL_TABLET | Freq: Every day | ORAL | 11 refills | Status: DC

## 2016-06-19 MED ORDER — DIVALPROEX SODIUM 500 MG PO DR TAB: 1500.0000 mg | DELAYED_RELEASE_TABLET | Freq: Every day | ORAL | 11 refills | Status: DC

## 2016-06-19 MED ORDER — INSULIN LISPRO 100 UNIT/ML (KWIKPEN): PEN_INJECTOR | SUBCUTANEOUS | 11 refills | Status: DC

## 2016-06-19 MED ORDER — TESTOSTERONE 20.25 MG/1.25GM (1.62%) TD GEL: TRANSDERMAL | 5 refills | Status: DC

## 2016-06-19 MED ORDER — CLONAZEPAM 2 MG PO TABS: 2.0000 mg | ORAL_TABLET | Freq: Two times a day (BID) | ORAL | 5 refills | Status: DC | PRN

## 2016-06-19 NOTE — Telephone Encounter (Signed)
States new forms sent was not correct.  Needs a new copy of ID sent in (with ID information written out beside copy) for Androgel.   Depakote needs doctor to sign off on and prescription strength.  Patient was not too sure on what was needed for this med.

## 2016-06-19 NOTE — Telephone Encounter (Signed)
Okay to give sample if we have them?   ----- Message -----  From: [name]  Sent: 06/04/2016  9:58 AM  To: Ander Slade, RN  Subject: Visit Follow-Up Question               ----- Message from [place] sent at 06/04/2016 9:58 AM EST -----      ----- Message from [name] to [name], MD sent at 06/02/2016 12:30 PM -----   Dr. [name],    I was checking to see how much The insulin bottle and the flex pen. They are both over $400.00. I was wondering if you had samples of anything that could help me out. Please let me know, I need to get my sugar under control until it's too late. Thank you!    Sincerely,   [name]    OK  Thx

## 2016-06-20 ENCOUNTER — Ambulatory Visit: Payer: Self-pay | Admitting: Internal Medicine

## 2016-06-20 ENCOUNTER — Telehealth: Payer: Self-pay | Admitting: Internal Medicine

## 2016-06-20 NOTE — Telephone Encounter (Signed)
Pt would like to know if there is a community organization that helps pts receive blood pressure cuffs.

## 2016-06-20 NOTE — Telephone Encounter (Signed)
Gave forms and signed rx's to [facility]

## 2016-06-20 NOTE — Telephone Encounter (Signed)
Also, are there any sources for glucometer test strips? Acucheck Glide Glucometer.

## 2016-06-20 NOTE — Telephone Encounter (Signed)
Pt was given samples of Humalog Kwikpen. Pt will be out of sample in a few days. Can we check to see if a rep could bring samples for pt on Monday?

## 2016-06-23 ENCOUNTER — Telehealth: Payer: Self-pay | Admitting: Internal Medicine

## 2016-06-23 NOTE — Telephone Encounter (Signed)
Pt called checking on samples. He is out of insulin and also has some other questions regarding the patient assistance forms. He asked if you could call him back today. Thanks [employer]

## 2016-06-25 ENCOUNTER — Other Ambulatory Visit: Payer: Self-pay | Admitting: *Deleted

## 2016-06-25 MED ORDER — ACCU-CHEK GUIDE W/DEVICE KIT: 1.0000 | PACK | Freq: Every day | 0 refills | Status: DC

## 2016-06-25 MED ORDER — GLUCOSE BLOOD VI STRP: 1.0000 | ORAL_STRIP | Freq: Two times a day (BID) | 3 refills | Status: DC

## 2016-06-25 MED ORDER — ACCU-CHEK SOFTCLIX LANCETS MISC: 0 refills | Status: DC

## 2016-06-25 NOTE — Telephone Encounter (Signed)
Rec'd fax [facility] Accu-chek guide monitor w/supplies. Verified chart pt is up-to=-date. Sent rx's to [facility]...Johny Chess

## 2016-06-25 NOTE — Telephone Encounter (Signed)
Called patient to discuss resource options for medications and medical supplies of glucose strips and blood pressure cuff monitor. Patient was given website and phone number resource for [place]. Discussed varius websites that would have savings for glucose strips and that [facility] has economical deals regarding blood pressure monitors. Also, discussed [facility] as an option to received medications, orange card and medical treatment. Patient was informed that Adelina Mings continues to work with Dr. [name] in regards with obtaining patient's medications. Patient verbalized understanding and was appreciative for the additional resource options.

## 2016-06-26 NOTE — Telephone Encounter (Signed)
Called patient to discuss medication assistance issues. Patient did not answer the phone. Nurse left a voice messaged requesting that patient call her back.

## 2016-06-26 NOTE — Telephone Encounter (Signed)
Called patient to clarify and obtain information to complete medication prescription applications. Discussed that completions and updates will be made and the applications will be faxed back to the drug representatives for approval.

## 2016-06-26 NOTE — Telephone Encounter (Signed)
Filled out the section for the Depakote. Tammy is refaxing to [facility] for the Depakote.  Tried to call the [facility] yesterday but they were closed for inclement weather.   The [employer] form is needing information from the patient. We received the fax stating that they needed income documentation from the patient. We do not have that information and patient will need to submit that information.

## 2016-06-27 MED ORDER — TESTOSTERONE 20.25 MG/1.25GM (1.62%) TD GEL: 20.2500 mg | Freq: Three times a day (TID) | TRANSDERMAL | 5 refills | Status: DC

## 2016-06-27 NOTE — Telephone Encounter (Signed)
Nurse spoke to PCP regarding specific androgel order needed for [facility]. New prescription written and approved by physician androgel 20.25 mg topically x 3 daily.

## 2016-06-27 NOTE — Telephone Encounter (Signed)
Letter has been written, printed and given to PCP to sign.

## 2016-07-01 ENCOUNTER — Telehealth: Payer: Self-pay | Admitting: Internal Medicine

## 2016-07-01 NOTE — Telephone Encounter (Signed)
Called patient to discuss responses from patient assistance programs regarding his medications. Patient did not answer the phone. Nurse LVM requesting patient to call her back and left contact number for patient.. Nurse will attempt to call patient back to discuss issues at a later time.

## 2016-07-01 NOTE — Telephone Encounter (Signed)
Rec'd from [facility] forward 8 pages to Dr.Plotnkov

## 2016-07-01 NOTE — Telephone Encounter (Signed)
Mr. Melander called back to discuss the best way for him to get his prescribed medications as he at this time is without insurance and cannot afford them. Discussed with the patient that to the best of my knowledge, the medication assistance program application process is completed for the medications of androgel, insulin, and depakote and informed the patient that his other medications are not offered through the medication assistance programs. Talked to the patient in detail that given his current circumstance,  the [facility] would be the best place for him to seek medical care. Mr. Wildeman was informed that this facility has SW, a pharmacy, applications to receive orange cards for discounted medications, and physicians, all of which could help the patient medically. The patient was given the number for the [facility]. He was also given information regarding the MedAssist program of [place]. The patient was appreciative of the resource information and he was advised to call this nurse back if she could help him any further.

## 2016-07-02 ENCOUNTER — Other Ambulatory Visit: Payer: Self-pay | Admitting: Orthopedic Surgery

## 2016-07-02 DIAGNOSIS — M5416 Radiculopathy, lumbar region: Secondary | ICD-10-CM

## 2016-07-07 ENCOUNTER — Emergency Department (HOSPITAL_COMMUNITY)
Admission: EM | Admit: 2016-07-07 | Discharge: 2016-07-08 | Disposition: A | Discharge status: Home / Self Care | Payer: Medicaid Other | Attending: Emergency Medicine | Admitting: Emergency Medicine

## 2016-07-07 ENCOUNTER — Encounter (HOSPITAL_COMMUNITY): Payer: Self-pay | Admitting: Emergency Medicine

## 2016-07-07 ENCOUNTER — Emergency Department (HOSPITAL_COMMUNITY): Payer: Medicaid Other

## 2016-07-07 DIAGNOSIS — E1122 Type 2 diabetes mellitus with diabetic chronic kidney disease: Secondary | ICD-10-CM | POA: Diagnosis not present

## 2016-07-07 DIAGNOSIS — Y92009 Unspecified place in unspecified non-institutional (private) residence as the place of occurrence of the external cause: Secondary | ICD-10-CM | POA: Insufficient documentation

## 2016-07-07 DIAGNOSIS — I129 Hypertensive chronic kidney disease with stage 1 through stage 4 chronic kidney disease, or unspecified chronic kidney disease: Secondary | ICD-10-CM | POA: Diagnosis not present

## 2016-07-07 DIAGNOSIS — S50311A Abrasion of right elbow, initial encounter: Secondary | ICD-10-CM | POA: Insufficient documentation

## 2016-07-07 DIAGNOSIS — Z87891 Personal history of nicotine dependence: Secondary | ICD-10-CM | POA: Insufficient documentation

## 2016-07-07 DIAGNOSIS — Z794 Long term (current) use of insulin: Secondary | ICD-10-CM | POA: Insufficient documentation

## 2016-07-07 DIAGNOSIS — G8929 Other chronic pain: Secondary | ICD-10-CM

## 2016-07-07 DIAGNOSIS — M546 Pain in thoracic spine: Secondary | ICD-10-CM | POA: Insufficient documentation

## 2016-07-07 DIAGNOSIS — Y9389 Activity, other specified: Secondary | ICD-10-CM | POA: Diagnosis not present

## 2016-07-07 DIAGNOSIS — Z79899 Other long term (current) drug therapy: Secondary | ICD-10-CM | POA: Diagnosis not present

## 2016-07-07 DIAGNOSIS — F909 Attention-deficit hyperactivity disorder, unspecified type: Secondary | ICD-10-CM | POA: Insufficient documentation

## 2016-07-07 DIAGNOSIS — S59901A Unspecified injury of right elbow, initial encounter: Secondary | ICD-10-CM | POA: Diagnosis present

## 2016-07-07 DIAGNOSIS — N183 Chronic kidney disease, stage 3 (moderate): Secondary | ICD-10-CM | POA: Diagnosis not present

## 2016-07-07 DIAGNOSIS — Y999 Unspecified external cause status: Secondary | ICD-10-CM | POA: Insufficient documentation

## 2016-07-07 DIAGNOSIS — M545 Low back pain: Secondary | ICD-10-CM | POA: Diagnosis not present

## 2016-07-07 DIAGNOSIS — Z96643 Presence of artificial hip joint, bilateral: Secondary | ICD-10-CM | POA: Insufficient documentation

## 2016-07-07 DIAGNOSIS — W1789XA Other fall from one level to another, initial encounter: Secondary | ICD-10-CM | POA: Insufficient documentation

## 2016-07-07 DIAGNOSIS — W19XXXA Unspecified fall, initial encounter: Secondary | ICD-10-CM

## 2016-07-07 NOTE — ED Provider Notes (Signed)
[facility] DEPT Provider Note   CSN: [id number] Arrival date & time: 07/07/16  1638     History   Chief Complaint Chief Complaint  Patient presents with  . Back Pain    HPI [name] is a 52 y.o. male.  Patient with a history of DM, CKD, depression, HTN, ADD, chronic back pain s/p lumbar fusion ([place], 2006) presents after fall this morning around 9:00 am. He states he fell off his porch and landed on his right side, causing severe thoracic and right upper back pain, as well as increased pain in the lower back. He reports he remembers the fall and events post-fall. No LOC, nausea. He does not complain of abdominal or anterior chest pain or LE symptoms. No urinary or bowel incontinence.    The history is provided by the patient. No language interpreter was used.    Past Medical History:  Diagnosis Date  . ADD (attention deficit disorder)   . Anxiety   . Arthritis    right hip  . Bipolar 1 disorder ([place])   . Blood in urine   . CKD (chronic kidney disease), stage III   . Creatinine elevation   . Depression    bipolar [facility]  . Diabetes mellitus without complication ([place])   . Family history of anesthesia complication    pt is unsure , but pt father may have been difficult to arouse   . History of kidney stones   . Hypertension   . Hypogonadism male   . Liver fatty degeneration   . Microscopic hematuria    hereditary s/p Urology eval  . Pneumonia 10-2012  . Polysubstance dependence, non-opioid, in remission ([place])    remote  . PTSD (post-traumatic stress disorder)    SOCIAL ANXIETY DISORDER     Patient Active Problem List   Diagnosis Date Noted  . Altered mental status   . Multiple drug overdose 12/27/2015  . Suicidal overdose ([place]) 12/26/2015  . Suicide attempt by multiple drug overdose ([place]) 12/26/2015  . Ataxia 10/14/2015  . Slurring of speech 10/14/2015  . Leg weakness 10/03/2015  . CRF (chronic renal failure) 10/03/2015  . Hypokalemia  10/03/2015  . Hemiparesis ([place])   . Seizures ([place])   . Primary osteoarthritis of left hip 05/22/2015  . Exposure to potentially hazardous body fluids 02/12/2015  . Loss of weight 09/04/2014  . Dyslipidemia 05/31/2014  . Left hip pain 02/13/2014  . Insomnia 11/11/2013  . Degenerative joint disease (DJD) of hip 08/16/2013  . Acute respiratory failure ([place]) 11/02/2012  . Pneumonia, organism unspecified(486) 11/02/2012  . Pleural effusion 11/02/2012  . Hypoxemia 11/02/2012  . HCAP (healthcare-associated pneumonia) 10/31/2012  . Anemia 10/31/2012  . Osteoarthritis of right hip 11/28/2011  . Right hip pain 07/22/2011  . Bilateral hip pain 07/22/2011  . Low back pain 05/20/2011  . Testicular pain, right 05/20/2011  . Erectile dysfunction 02/17/2011  . Constipation - functional 02/17/2011  . Diabetes type 2, uncontrolled ([place]) 11/06/2010  . Hypogonadism male 05/20/2010  . TOBACCO USER 05/20/2010  . Demoralization and apathy 05/20/2010  . Bipolar disorder ([place]) 01/18/2010  . Depression 01/18/2010  . MICROSCOPIC HEMATURIA 01/18/2010  . SHOULDER PAIN 01/18/2010  . Anxiety state 12/05/2006  . Essential hypertension 12/05/2006    Past Surgical History:  Procedure Laterality Date  . BACK SURGERY    . CLOSED REDUCTION METACARPAL WITH PERCUTANEOUS PINNING Right   . LUMBAR DISC SURGERY    . TONSILLECTOMY    . TOTAL HIP ARTHROPLASTY  Right 08/16/2013   Procedure: TOTAL HIP ARTHROPLASTY ANTERIOR APPROACH;  Surgeon: Hessie Dibble, MD;  Location: [facility];  Service: Orthopedics;  Laterality: Right;  . TOTAL HIP ARTHROPLASTY Left 05/22/2015   Procedure: TOTAL HIP ARTHROPLASTY ANTERIOR APPROACH;  Surgeon: Melrose Nakayama, MD;  Location: [facility];  Service: Orthopedics;  Laterality: Left;       Home Medications    Prior to Admission medications   Medication Sig Start Date End Date Taking? Authorizing Provider  ACCU-CHEK SOFTCLIX LANCETS lancets Use to check blood sugars twice a day 06/25/16    Aleksei Plotnikov V, MD  amLODipine (NORVASC) 5 MG tablet Take 1 tablet by mouth daily. 01/11/16 01/10/17  Historical Provider, MD  bisacodyl (DULCOLAX) 10 MG suppository Place 10 mg rectally as needed for moderate constipation.    Historical Provider, MD  Blood Glucose Monitoring Suppl (ACCU-CHEK GUIDE) w/Device KIT 1 Device by Does not apply route daily. Use to check BS twice a day 06/25/16   Aleksei Plotnikov V, MD  buPROPion (WELLBUTRIN XL) 150 MG 24 hr tablet Take 2 tablets (300 mg total) by mouth every morning. 06/19/16   Aleksei Plotnikov V, MD  clonazePAM (KLONOPIN) 2 MG tablet Take 1 tablet (2 mg total) by mouth 2 (two) times daily as needed. for anxiety 06/19/16   Aleksei Plotnikov V, MD  divalproex (DEPAKOTE) 500 MG DR tablet Take 3 tablets (1,500 mg total) by mouth daily. 06/19/16 06/19/17  Aleksei Plotnikov V, MD  feeding supplement, GLUCERNA SHAKE, (GLUCERNA SHAKE) LIQD Take 237 mLs by mouth daily. 10/05/15   Florencia Reasons, MD  gabapentin (NEURONTIN) 600 MG tablet Take 2 tablets by mouth 3 (three) times daily. 03/17/16   Historical Provider, MD  glucose blood (ACCU-CHEK GUIDE) test strip 1 each by Other route 2 (two) times daily. Use to check blood sugars twice a day 06/25/16   Aleksei Plotnikov V, MD  insulin lispro (HUMALOG KWIKPEN) 100 UNIT/ML KiwkPen Inject 10 units before each meal (total of 3 times per day). 06/19/16   Aleksei Plotnikov V, MD  insulin lispro (HUMALOG) 100 UNIT/ML injection Inject 0.1 mLs (10 Units total) into the skin 3 (three) times daily with meals. 06/02/16 06/02/17  Aleksei Plotnikov V, MD  Insulin Pen Needle (B-D ULTRAFINE III SHORT PEN) 31G X 8 MM MISC Use ad directed with insulin pens. 04/19/16   Aleksei Plotnikov V, MD  losartan-hydrochlorothiazide (HYZAAR) 100-12.5 MG tablet Take 1 tablet by mouth daily.    Historical Provider, MD  methocarbamol (ROBAXIN) 500 MG tablet Take 1 tablet (500 mg total) by mouth 2 (two) times daily. 04/15/16   Doristine Devoid, PA-C  mirtazapine  (REMERON) 45 MG tablet Take 1 tablet by mouth daily. 01/15/16   Historical Provider, MD  Multiple Vitamin (MULTIVITAMIN WITH MINERALS) TABS Take 1 tablet by mouth every morning.    Historical Provider, MD  propranolol (INDERAL) 20 MG tablet Take 1 tablet (20 mg total) by mouth 2 (two) times daily. 06/02/16 06/02/17  Aleksei Plotnikov V, MD  QUEtiapine (SEROQUEL) 400 MG tablet Take 1 tablet (400 mg total) by mouth at bedtime. 06/19/16   Aleksei Plotnikov V, MD  Testosterone (ANDROGEL) 20.25 MG/1.25GM (1.62%) GEL Apply 20.25 mg topically 3 (three) times daily. Place the topically solution under (1) arm pit alternating with each application. Total daily amount of medication equals (60.75 mg). 06/27/16   Aleksei Plotnikov V, MD  testosterone cypionate (DEPOTESTOSTERONE CYPIONATE) 200 MG/ML injection Inject 0.75 mLs (150 mg total) into the muscle once a week. 03/19/16  Aleksei Plotnikov V, MD  traZODone (DESYREL) 150 MG tablet Take 1 tablet (150 mg total) by mouth at bedtime. 06/19/16   Aleksei Plotnikov V, MD  TRUVADA 200-300 MG tablet Take 1 tablet by mouth daily. Take 1 tablet by mouth daily 02/18/16   [name], MD    Family History Family History  Problem Relation Age of Onset  . Diabetes Father   . Cancer Mother     died of melanoma with mets  . Cervical cancer Sister   . Diabetes Sister   . Other Neg Hx     hypogonadism    Social History Social History  Substance Use Topics  . Smoking status: Former Smoker    Years: 0.00    Types: Cigarettes    Quit date: 02/19/2014  . Smokeless tobacco: Never Used  . Alcohol use No     Allergies   Vicodin [hydrocodone-acetaminophen]   Review of Systems Review of Systems  Constitutional: Negative for chills and fever.  Respiratory: Negative.  Negative for shortness of breath.   Cardiovascular: Negative.  Negative for chest pain.  Gastrointestinal: Negative.  Negative for nausea and vomiting.  Musculoskeletal: Positive for back pain.        See HPI.  Skin: Negative.  Negative for wound.  Neurological: Negative.  Negative for syncope.     Physical Exam Updated Vital Signs BP 139/84   Pulse (!) 107   Temp 98.2 F (36.8 C) (Oral)   Resp 20   SpO2 95%   Physical Exam  Constitutional: He is oriented to person, place, and time. He appears well-developed and well-nourished.  HENT:  Head: Normocephalic.  Neck: Normal range of motion. Neck supple.  Cardiovascular: Normal rate and regular rhythm.   Pulmonary/Chest: Effort normal and breath sounds normal. He has no wheezes. He has no rales.  Abdominal: Soft. Bowel sounds are normal. There is tenderness (Diffuse, very mild abdominal tenderness. ). There is no rebound and no guarding.  Musculoskeletal: Normal range of motion.  FROM all extremities. No bony deformities. There is midline and right thoracic back tenderness without bruising, swelling or deformities. Midline lumbar tenderness.   Neurological: He is alert and oriented to person, place, and time.  Skin: Skin is warm and dry.  Right arm abrasion extending from posterior elbow to ulnar wrist. No laceration, swelling.   Psychiatric: He has a normal mood and affect.     ED Treatments / Results  Labs (all labs ordered are listed, but only abnormal results are displayed) Labs Reviewed - No data to display  EKG  EKG Interpretation None       Radiology No results found.  Procedures Procedures (including critical care time)  Medications Ordered in ED Medications - No data to display   Initial Impression / Assessment and Plan / ED Course  I have reviewed the triage vital signs and the nursing notes.  Pertinent labs & imaging results that were available during my care of the patient were reviewed by me and considered in my medical decision making (see chart for details).     Patient with thoracic pain s/p fall this morning. No syncope. Will obtain imaging of thoracic spine, right ribs. He reports he  does not like to take pain medication and appears comfortable, able to watch videos on his phone in NAD. Will treat based on imaging results.   Patient c/o bilateral shoulder pain while in x-ray. On exam he was able to roll onto the left shoulder without c/o  pain. Will add right shoulder film to evaluation fully, but do not feel there is left shoulder injury.  Imaging is negative for fracture. Patient's pain is treated in the ED. He is ambulatory without unsteadiness and is felt appropriate for discharge home.     Final Clinical Impressions(s) / ED Diagnoses   Final diagnoses:  None   1. Fall 2. Acute on chronic back pain  New Prescriptions New Prescriptions   No medications on file     Charlann Lange, PA-C 07/08/16 [place], DO 07/08/16 1135

## 2016-07-07 NOTE — ED Notes (Signed)
Patient transported to X-ray 

## 2016-07-07 NOTE — ED Notes (Signed)
Pt's name called for a room no answer. Pts in waiting room said he went to bathroom all 3 are currently empty. Will check for him again soon

## 2016-07-07 NOTE — ED Triage Notes (Addendum)
Pt here for upper and lower back pain that is chronic in nature; pt sts fell down two steps today

## 2016-07-08 ENCOUNTER — Emergency Department (HOSPITAL_COMMUNITY): Payer: Medicaid Other

## 2016-07-08 ENCOUNTER — Ambulatory Visit: Payer: BLUE CROSS/BLUE SHIELD | Admitting: Endocrinology

## 2016-07-08 MED ORDER — OXYCODONE-ACETAMINOPHEN 5-325 MG PO TABS: 1.0000 | ORAL_TABLET | ORAL | 0 refills | Status: DC | PRN

## 2016-07-08 MED ORDER — OXYCODONE-ACETAMINOPHEN 5-325 MG PO TABS
1.0000 | ORAL_TABLET | Freq: Once | ORAL | Status: AC
  Administered 2016-07-08: 1 via ORAL
  Filled 2016-07-08: qty 1

## 2016-07-08 MED ORDER — METHOCARBAMOL 500 MG PO TABS: 500.0000 mg | ORAL_TABLET | Freq: Two times a day (BID) | ORAL | 0 refills | Status: DC

## 2016-07-08 NOTE — ED Notes (Signed)
Pt ambulated down hall. Steady gait, however pt complained of pain while breathing and demonstrated difficulty sitting up on the bed and getting back into the bed. RN aware.

## 2016-07-08 NOTE — ED Notes (Signed)
Per pt he is able to get percocet only allergic to Vicodin, PA aware.

## 2016-07-09 ENCOUNTER — Ambulatory Visit: Payer: Self-pay | Admitting: Internal Medicine

## 2016-07-11 NOTE — Telephone Encounter (Signed)
Called patient in response to him attempting to contact nurse today and leaving voice messages. Patient did not answer his phone. Nurse left a VM stating she will call him again on Monday 07/14/16

## 2016-07-11 NOTE — Telephone Encounter (Signed)
Sharee Pimple can you please call [name] back? He has some questions for you.

## 2016-07-11 NOTE — Telephone Encounter (Signed)
Attempted to call patient back in response to him calling and requesting a call-back from this nurse. Patient did not answer, nurse left a detailed voice mail requesting that he call her back.

## 2016-07-14 NOTE — Telephone Encounter (Signed)
Called the patient who wanted to discuss where he is in the process of obtaining medications from medication assistance programs. Nurse encouraged the patient to call [employer] to inquire about the insulin he has applied for and to also call the other places he turn in applications for medicines and follow-up, the patient verbalized understanding. Discussed that the patient can also contact [facility] to establish medical care. Encouragement was given. The patient was appreciative and stated that he understood his options at this time. Nurse ask that the patient call her back if she could be of any further help in the future.

## 2016-07-14 NOTE — Telephone Encounter (Signed)
Called patient in response to him calling 07/11/16 and asking to speak to nurse health coach. Patient did not answer his phone. Left a voice message stating I will attempt to call back later today and left nurse's direct phone number for patient to call.

## 2016-07-15 ENCOUNTER — Telehealth: Payer: Self-pay | Admitting: *Deleted

## 2016-07-15 NOTE — Telephone Encounter (Signed)
Called in response to patient leaving a VM requesting a callback from nurse. Patient wanted to discuss the application process status of the medications that he has applied for. Patient states he has received androgel but does not know the status of the insulin. It was discussed that the patient would contact [employer] to inquire what if anything else needs to be completed to finish the application process. The patient stated that he does have the contact information to reach out to [facility] and the other companies where he has applied for medications. He stated that he would contact nurse back if she could help in any way. Nurse encouraged patient to contact the [facility], explaining that he could establish medical care and the facility would assist him with getting the medicines he needed. Patient verbalized understanding and was appreciative for the advice.

## 2016-07-23 ENCOUNTER — Telehealth: Payer: Self-pay | Admitting: Internal Medicine

## 2016-07-23 NOTE — Telephone Encounter (Signed)
Pt called to check if we have received DEPAKOTE  from the patient assistance program for him. I told him that I was not sure but that I would see if I could find out if anyone has seen it. He said that he was going to call the company to follow up on the medication. Do you know if we have received it? Please advise. Thanks [employer]

## 2016-07-23 NOTE — Telephone Encounter (Signed)
Called in response to patient's questions about depakote. Patient discussed that depakote medication will be shipped to [place] offices. Nurse instructed patient she would informed the front office to watch out for the shipment and he will be called when it is received.

## 2016-07-24 ENCOUNTER — Other Ambulatory Visit: Payer: Self-pay | Admitting: Internal Medicine

## 2016-07-29 ENCOUNTER — Other Ambulatory Visit: Payer: Self-pay | Admitting: Internal Medicine

## 2016-07-29 ENCOUNTER — Telehealth: Payer: Self-pay

## 2016-07-29 NOTE — Telephone Encounter (Signed)
Advised patient, depakote meds (thru patient assist program) is ready for pick up at front office

## 2016-07-29 NOTE — Telephone Encounter (Signed)
Called refill into [facility] had to leave on pharmacy vm...[name]

## 2016-07-31 ENCOUNTER — Other Ambulatory Visit (HOSPITAL_COMMUNITY): Payer: Self-pay | Admitting: Orthopedic Surgery

## 2016-07-31 DIAGNOSIS — M5416 Radiculopathy, lumbar region: Secondary | ICD-10-CM

## 2016-07-31 DIAGNOSIS — M545 Low back pain: Secondary | ICD-10-CM

## 2016-08-02 ENCOUNTER — Encounter: Payer: Self-pay | Admitting: Internal Medicine

## 2016-08-05 ENCOUNTER — Ambulatory Visit (HOSPITAL_COMMUNITY)
Admission: RE | Admit: 2016-08-05 | Discharge: 2016-08-05 | Disposition: A | Discharge status: Home / Self Care | Payer: Medicaid Other | Source: Ambulatory Visit | Attending: Orthopedic Surgery | Admitting: Orthopedic Surgery

## 2016-08-05 DIAGNOSIS — M5416 Radiculopathy, lumbar region: Secondary | ICD-10-CM | POA: Diagnosis present

## 2016-08-05 DIAGNOSIS — M5126 Other intervertebral disc displacement, lumbar region: Secondary | ICD-10-CM | POA: Insufficient documentation

## 2016-08-05 DIAGNOSIS — M545 Low back pain: Secondary | ICD-10-CM

## 2016-08-05 DIAGNOSIS — M48061 Spinal stenosis, lumbar region without neurogenic claudication: Secondary | ICD-10-CM | POA: Diagnosis not present

## 2016-08-08 ENCOUNTER — Telehealth: Payer: Self-pay

## 2016-08-08 NOTE — Telephone Encounter (Signed)
I have placed patient assistance insulin for this patient in [facility] refrigerator---can you please let patient know it's ready for pick up, will need to keep refrigerated/cold until he gets home

## 2016-08-08 NOTE — Telephone Encounter (Signed)
Pt.notified

## 2016-08-15 ENCOUNTER — Ambulatory Visit: Payer: Self-pay | Admitting: Endocrinology

## 2016-08-19 NOTE — Telephone Encounter (Signed)
No phone call was made, no note.

## 2016-09-01 ENCOUNTER — Ambulatory Visit: Payer: Self-pay | Admitting: Internal Medicine

## 2016-09-07 ENCOUNTER — Emergency Department (HOSPITAL_COMMUNITY): Payer: Medicaid Other

## 2016-09-07 ENCOUNTER — Inpatient Hospital Stay (HOSPITAL_COMMUNITY)
Admission: EM | Admit: 2016-09-07 | Discharge: 2016-10-13 | DRG: 071 | Disposition: A | Discharge status: Home / Self Care | Payer: Medicaid Other | Attending: Internal Medicine | Admitting: Internal Medicine

## 2016-09-07 ENCOUNTER — Encounter (HOSPITAL_COMMUNITY): Payer: Self-pay

## 2016-09-07 DIAGNOSIS — R440 Auditory hallucinations: Secondary | ICD-10-CM | POA: Diagnosis not present

## 2016-09-07 DIAGNOSIS — F319 Bipolar disorder, unspecified: Secondary | ICD-10-CM | POA: Diagnosis present

## 2016-09-07 DIAGNOSIS — M545 Low back pain: Secondary | ICD-10-CM | POA: Diagnosis present

## 2016-09-07 DIAGNOSIS — E860 Dehydration: Secondary | ICD-10-CM | POA: Diagnosis present

## 2016-09-07 DIAGNOSIS — D696 Thrombocytopenia, unspecified: Secondary | ICD-10-CM | POA: Diagnosis present

## 2016-09-07 DIAGNOSIS — Z915 Personal history of self-harm: Secondary | ICD-10-CM

## 2016-09-07 DIAGNOSIS — N179 Acute kidney failure, unspecified: Secondary | ICD-10-CM | POA: Diagnosis present

## 2016-09-07 DIAGNOSIS — N183 Chronic kidney disease, stage 3 unspecified: Secondary | ICD-10-CM

## 2016-09-07 DIAGNOSIS — G40909 Epilepsy, unspecified, not intractable, without status epilepticus: Secondary | ICD-10-CM | POA: Diagnosis present

## 2016-09-07 DIAGNOSIS — G9341 Metabolic encephalopathy: Secondary | ICD-10-CM | POA: Diagnosis not present

## 2016-09-07 DIAGNOSIS — Y846 Urinary catheterization as the cause of abnormal reaction of the patient, or of later complication, without mention of misadventure at the time of the procedure: Secondary | ICD-10-CM | POA: Diagnosis present

## 2016-09-07 DIAGNOSIS — L723 Sebaceous cyst: Secondary | ICD-10-CM | POA: Diagnosis present

## 2016-09-07 DIAGNOSIS — K649 Unspecified hemorrhoids: Secondary | ICD-10-CM | POA: Diagnosis present

## 2016-09-07 DIAGNOSIS — R404 Transient alteration of awareness: Secondary | ICD-10-CM | POA: Diagnosis not present

## 2016-09-07 DIAGNOSIS — F411 Generalized anxiety disorder: Secondary | ICD-10-CM | POA: Diagnosis present

## 2016-09-07 DIAGNOSIS — N189 Chronic kidney disease, unspecified: Secondary | ICD-10-CM | POA: Diagnosis present

## 2016-09-07 DIAGNOSIS — K219 Gastro-esophageal reflux disease without esophagitis: Secondary | ICD-10-CM | POA: Insufficient documentation

## 2016-09-07 DIAGNOSIS — E876 Hypokalemia: Secondary | ICD-10-CM | POA: Diagnosis present

## 2016-09-07 DIAGNOSIS — F3175 Bipolar disorder, in partial remission, most recent episode depressed: Secondary | ICD-10-CM | POA: Diagnosis not present

## 2016-09-07 DIAGNOSIS — K5900 Constipation, unspecified: Secondary | ICD-10-CM | POA: Diagnosis not present

## 2016-09-07 DIAGNOSIS — T83518A Infection and inflammatory reaction due to other urinary catheter, initial encounter: Secondary | ICD-10-CM | POA: Diagnosis present

## 2016-09-07 DIAGNOSIS — F32A Depression, unspecified: Secondary | ICD-10-CM | POA: Diagnosis present

## 2016-09-07 DIAGNOSIS — F329 Major depressive disorder, single episode, unspecified: Secondary | ICD-10-CM | POA: Diagnosis present

## 2016-09-07 DIAGNOSIS — Z794 Long term (current) use of insulin: Secondary | ICD-10-CM

## 2016-09-07 DIAGNOSIS — E1122 Type 2 diabetes mellitus with diabetic chronic kidney disease: Secondary | ICD-10-CM | POA: Diagnosis not present

## 2016-09-07 DIAGNOSIS — R4701 Aphasia: Secondary | ICD-10-CM | POA: Diagnosis not present

## 2016-09-07 DIAGNOSIS — G319 Degenerative disease of nervous system, unspecified: Secondary | ICD-10-CM | POA: Diagnosis present

## 2016-09-07 DIAGNOSIS — Z9119 Patient's noncompliance with other medical treatment and regimen: Secondary | ICD-10-CM | POA: Diagnosis not present

## 2016-09-07 DIAGNOSIS — Z96643 Presence of artificial hip joint, bilateral: Secondary | ICD-10-CM | POA: Diagnosis present

## 2016-09-07 DIAGNOSIS — F4010 Social phobia, unspecified: Secondary | ICD-10-CM | POA: Diagnosis present

## 2016-09-07 DIAGNOSIS — R339 Retention of urine, unspecified: Secondary | ICD-10-CM | POA: Diagnosis present

## 2016-09-07 DIAGNOSIS — G629 Polyneuropathy, unspecified: Secondary | ICD-10-CM | POA: Diagnosis present

## 2016-09-07 DIAGNOSIS — W19XXXA Unspecified fall, initial encounter: Secondary | ICD-10-CM | POA: Diagnosis not present

## 2016-09-07 DIAGNOSIS — E781 Pure hyperglyceridemia: Secondary | ICD-10-CM

## 2016-09-07 DIAGNOSIS — R45850 Homicidal ideations: Secondary | ICD-10-CM | POA: Diagnosis not present

## 2016-09-07 DIAGNOSIS — R441 Visual hallucinations: Secondary | ICD-10-CM | POA: Diagnosis present

## 2016-09-07 DIAGNOSIS — F4310 Post-traumatic stress disorder, unspecified: Secondary | ICD-10-CM | POA: Diagnosis present

## 2016-09-07 DIAGNOSIS — R200 Anesthesia of skin: Secondary | ICD-10-CM

## 2016-09-07 DIAGNOSIS — R569 Unspecified convulsions: Secondary | ICD-10-CM

## 2016-09-07 DIAGNOSIS — R338 Other retention of urine: Secondary | ICD-10-CM | POA: Diagnosis not present

## 2016-09-07 DIAGNOSIS — F0391 Unspecified dementia with behavioral disturbance: Secondary | ICD-10-CM | POA: Diagnosis not present

## 2016-09-07 DIAGNOSIS — B9620 Unspecified Escherichia coli [E. coli] as the cause of diseases classified elsewhere: Secondary | ICD-10-CM | POA: Diagnosis present

## 2016-09-07 DIAGNOSIS — Z9114 Patient's other noncompliance with medication regimen: Secondary | ICD-10-CM

## 2016-09-07 DIAGNOSIS — N390 Urinary tract infection, site not specified: Secondary | ICD-10-CM | POA: Diagnosis not present

## 2016-09-07 DIAGNOSIS — G9340 Encephalopathy, unspecified: Secondary | ICD-10-CM | POA: Diagnosis present

## 2016-09-07 DIAGNOSIS — R262 Difficulty in walking, not elsewhere classified: Secondary | ICD-10-CM | POA: Diagnosis present

## 2016-09-07 DIAGNOSIS — F250 Schizoaffective disorder, bipolar type: Secondary | ICD-10-CM | POA: Diagnosis present

## 2016-09-07 DIAGNOSIS — I129 Hypertensive chronic kidney disease with stage 1 through stage 4 chronic kidney disease, or unspecified chronic kidney disease: Secondary | ICD-10-CM | POA: Diagnosis present

## 2016-09-07 DIAGNOSIS — Z781 Physical restraint status: Secondary | ICD-10-CM

## 2016-09-07 DIAGNOSIS — E785 Hyperlipidemia, unspecified: Secondary | ICD-10-CM | POA: Diagnosis present

## 2016-09-07 DIAGNOSIS — Z87891 Personal history of nicotine dependence: Secondary | ICD-10-CM | POA: Diagnosis not present

## 2016-09-07 DIAGNOSIS — E1165 Type 2 diabetes mellitus with hyperglycemia: Secondary | ICD-10-CM | POA: Diagnosis not present

## 2016-09-07 DIAGNOSIS — Z79899 Other long term (current) drug therapy: Secondary | ICD-10-CM

## 2016-09-07 DIAGNOSIS — R45851 Suicidal ideations: Secondary | ICD-10-CM | POA: Diagnosis not present

## 2016-09-07 DIAGNOSIS — F0390 Unspecified dementia without behavioral disturbance: Secondary | ICD-10-CM

## 2016-09-07 DIAGNOSIS — Z87898 Personal history of other specified conditions: Secondary | ICD-10-CM

## 2016-09-07 DIAGNOSIS — R509 Fever, unspecified: Secondary | ICD-10-CM

## 2016-09-07 DIAGNOSIS — Z885 Allergy status to narcotic agent status: Secondary | ICD-10-CM

## 2016-09-07 DIAGNOSIS — I10 Essential (primary) hypertension: Secondary | ICD-10-CM | POA: Diagnosis present

## 2016-09-07 DIAGNOSIS — Z818 Family history of other mental and behavioral disorders: Secondary | ICD-10-CM | POA: Diagnosis not present

## 2016-09-07 DIAGNOSIS — G8929 Other chronic pain: Secondary | ICD-10-CM | POA: Diagnosis present

## 2016-09-07 DIAGNOSIS — F988 Other specified behavioral and emotional disorders with onset usually occurring in childhood and adolescence: Secondary | ICD-10-CM | POA: Diagnosis present

## 2016-09-07 HISTORY — DX: Unilateral primary osteoarthritis, left hip: M16.12

## 2016-09-07 HISTORY — DX: Unilateral primary osteoarthritis, right hip: M16.11

## 2016-09-07 HISTORY — DX: Pleural effusion, not elsewhere classified: J90

## 2016-09-07 HISTORY — DX: Pneumonia, unspecified organism: J18.9

## 2016-09-07 HISTORY — DX: Poisoning by multiple unspecified drugs, medicaments and biological substances, intentional self-harm, initial encounter: T50.912A

## 2016-09-07 LAB — TROPONIN I: Troponin I: <0.03 ng/mL (ref <0.03)

## 2016-09-07 LAB — CBC WITH DIFFERENTIAL/PLATELET
Basophils Absolute: 0 10*3/uL (ref 0.0–0.1)
Basophils Relative: 0 %
Eosinophils Absolute: 0 10*3/uL (ref 0.0–0.7)
Eosinophils Relative: 0 %
HEMATOCRIT: 39.5 % (ref 39.0–52.0)
Hemoglobin: 14.1 g/dL (ref 13.0–17.0)
Lymphocytes Relative: 20 %
Lymphs Abs: 1.2 10*3/uL (ref 0.7–4.0)
MCH: 30.9 pg (ref 26.0–34.0)
MCHC: 35.7 g/dL (ref 30.0–36.0)
MCV: 86.6 fL (ref 78.0–100.0)
MONO ABS: 0.5 10*3/uL (ref 0.1–1.0)
MONOS PCT: 8 %
NEUTROS PCT: 71 %
Neutro Abs: 4.3 10*3/uL (ref 1.7–7.7)
Platelets: 103 10*3/uL — ABNORMAL LOW (ref 150–400)
RBC: 4.56 MIL/uL (ref 4.22–5.81)
RDW: 12.9 % (ref 11.5–15.5)
WBC: 6.1 10*3/uL (ref 4.0–10.5)

## 2016-09-07 LAB — COMPREHENSIVE METABOLIC PANEL
ALBUMIN: 4 g/dL (ref 3.5–5.0)
ALK PHOS: 106 U/L (ref 38–126)
ALT: 30 U/L (ref 17–63)
ANION GAP: 9 (ref 5–15)
AST: 46 U/L — ABNORMAL HIGH (ref 15–41)
BILIRUBIN TOTAL: 1.1 mg/dL (ref 0.3–1.2)
BUN: 22 mg/dL — ABNORMAL HIGH (ref 6–20)
CALCIUM: 9.2 mg/dL (ref 8.9–10.3)
CO2: 23 mmol/L (ref 22–32)
CREATININE: 2.19 mg/dL — AB (ref 0.61–1.24)
Chloride: 103 mmol/L (ref 101–111)
GFR calc Af Amer: 39 mL/min — ABNORMAL LOW (ref >60)
GFR calc non Af Amer: 34 mL/min — ABNORMAL LOW (ref >60)
GLUCOSE: 144 mg/dL — AB (ref 65–99)
Potassium: 3.4 mmol/L — ABNORMAL LOW (ref 3.5–5.1)
Sodium: 135 mmol/L (ref 135–145)
TOTAL PROTEIN: 6.8 g/dL (ref 6.5–8.1)

## 2016-09-07 LAB — PROTIME-INR
INR: 1.09
Prothrombin Time: 14.2 seconds (ref 11.4–15.2)

## 2016-09-07 LAB — URINALYSIS, ROUTINE W REFLEX MICROSCOPIC
Bacteria, UA: NONE SEEN
Bilirubin Urine: NEGATIVE
Ketones, ur: 20 mg/dL — AB
Leukocytes, UA: NEGATIVE
NITRITE: NEGATIVE
PH: 6 (ref 5.0–8.0)
PROTEIN: 30 mg/dL — AB
SPECIFIC GRAVITY, URINE: 1.017 (ref 1.005–1.030)

## 2016-09-07 LAB — RAPID URINE DRUG SCREEN, HOSP PERFORMED
Amphetamines: NOT DETECTED
BENZODIAZEPINES: NOT DETECTED
Barbiturates: NOT DETECTED
Cocaine: NOT DETECTED
Opiates: NOT DETECTED
Tetrahydrocannabinol: NOT DETECTED

## 2016-09-07 LAB — ETHANOL: Alcohol, Ethyl (B): <5 mg/dL (ref <5)

## 2016-09-07 LAB — SALICYLATE LEVEL: Salicylate Lvl: <7 mg/dL (ref 2.8–30.0)

## 2016-09-07 LAB — AMMONIA: Ammonia: 43 umol/L — ABNORMAL HIGH (ref 9–35)

## 2016-09-07 LAB — ACETAMINOPHEN LEVEL: Acetaminophen (Tylenol), Serum: <10 ug/mL — ABNORMAL LOW (ref 10–30)

## 2016-09-07 LAB — VALPROIC ACID LEVEL: Valproic Acid Lvl: 78 ug/mL (ref 50.0–100.0)

## 2016-09-07 MED ORDER — PROPRANOLOL HCL 20 MG PO TABS
20.0000 mg | ORAL_TABLET | Freq: Two times a day (BID) | ORAL | Status: DC
  Administered 2016-09-07 – 2016-09-26 (×33): 20 mg via ORAL
  Filled 2016-09-07 (×39): qty 1

## 2016-09-07 MED ORDER — HYDROCHLOROTHIAZIDE 12.5 MG PO CAPS
12.5000 mg | ORAL_CAPSULE | Freq: Every day | ORAL | Status: DC
Start: 2016-09-07 — End: 2016-09-08
  Administered 2016-09-07 – 2016-09-08 (×2): 12.5 mg via ORAL
  Filled 2016-09-07 (×2): qty 1

## 2016-09-07 MED ORDER — OXYCODONE-ACETAMINOPHEN 5-325 MG PO TABS
1.0000 | ORAL_TABLET | ORAL | Status: DC | PRN
  Administered 2016-09-21 – 2016-09-28 (×4): 1 via ORAL
  Administered 2016-09-30: 2 via ORAL
  Administered 2016-09-30 – 2016-10-02 (×2): 1 via ORAL
  Administered 2016-10-05 – 2016-10-06 (×3): 2 via ORAL
  Filled 2016-09-07 (×2): qty 1
  Filled 2016-09-07: qty 2
  Filled 2016-09-07 (×2): qty 1
  Filled 2016-09-07: qty 2
  Filled 2016-09-07 (×2): qty 1
  Filled 2016-09-07 (×2): qty 2

## 2016-09-07 MED ORDER — LORAZEPAM 2 MG/ML IJ SOLN
2.0000 mg | Freq: Once | INTRAMUSCULAR | Status: AC
  Administered 2016-09-07: 2 mg via INTRAVENOUS
  Filled 2016-09-07: qty 1

## 2016-09-07 MED ORDER — SODIUM CHLORIDE 0.9 % IV BOLUS (SEPSIS)
1000.0000 mL | Freq: Once | INTRAVENOUS | Status: AC
  Administered 2016-09-07: 1000 mL via INTRAVENOUS

## 2016-09-07 MED ORDER — SODIUM CHLORIDE 0.9 % IV SOLN
INTRAVENOUS | Status: DC
  Administered 2016-09-07: 21:00:00 via INTRAVENOUS

## 2016-09-07 MED ORDER — LOSARTAN POTASSIUM 50 MG PO TABS
100.0000 mg | ORAL_TABLET | Freq: Every day | ORAL | Status: DC
  Administered 2016-09-07 – 2016-09-08 (×2): 100 mg via ORAL
  Filled 2016-09-07 (×3): qty 2

## 2016-09-07 MED ORDER — STERILE WATER FOR INJECTION IJ SOLN
INTRAMUSCULAR | Status: AC
Start: 2016-09-07 — End: 2016-09-07
  Administered 2016-09-07: 1.2 mL
  Filled 2016-09-07: qty 10

## 2016-09-07 MED ORDER — AMLODIPINE BESYLATE 5 MG PO TABS
5.0000 mg | ORAL_TABLET | Freq: Every day | ORAL | Status: DC
  Administered 2016-09-07 – 2016-09-08 (×2): 5 mg via ORAL
  Filled 2016-09-07 (×2): qty 1

## 2016-09-07 MED ORDER — INSULIN ASPART 100 UNIT/ML ~~LOC~~ SOLN: 0.0000 [IU] | Freq: Every day | SUBCUTANEOUS | Status: DC

## 2016-09-07 MED ORDER — HEPARIN SODIUM (PORCINE) 5000 UNIT/ML IJ SOLN
5000.0000 [IU] | Freq: Three times a day (TID) | INTRAMUSCULAR | Status: DC
  Administered 2016-09-07 – 2016-09-11 (×10): 5000 [IU] via SUBCUTANEOUS
  Filled 2016-09-07 (×10): qty 1

## 2016-09-07 MED ORDER — QUETIAPINE FUMARATE 100 MG PO TABS
400.0000 mg | ORAL_TABLET | Freq: Every day | ORAL | Status: DC
  Administered 2016-09-07 – 2016-09-08 (×2): 400 mg via ORAL
  Filled 2016-09-07: qty 4
  Filled 2016-09-07: qty 1

## 2016-09-07 MED ORDER — DIVALPROEX SODIUM 500 MG PO DR TAB
1500.0000 mg | DELAYED_RELEASE_TABLET | Freq: Every day | ORAL | Status: DC
  Administered 2016-09-07 – 2016-10-12 (×36): 1500 mg via ORAL
  Filled 2016-09-07 (×37): qty 3

## 2016-09-07 MED ORDER — ONDANSETRON HCL 4 MG PO TABS: 4.0000 mg | ORAL_TABLET | Freq: Four times a day (QID) | ORAL | Status: DC | PRN

## 2016-09-07 MED ORDER — INSULIN ASPART 100 UNIT/ML ~~LOC~~ SOLN
0.0000 [IU] | Freq: Three times a day (TID) | SUBCUTANEOUS | Status: DC
  Administered 2016-09-08: 2 [IU] via SUBCUTANEOUS

## 2016-09-07 MED ORDER — LOSARTAN POTASSIUM-HCTZ 100-12.5 MG PO TABS: 1.0000 | ORAL_TABLET | Freq: Every day | ORAL | Status: DC

## 2016-09-07 MED ORDER — ONDANSETRON HCL 4 MG/2ML IJ SOLN: 4.0000 mg | Freq: Four times a day (QID) | INTRAMUSCULAR | Status: DC | PRN

## 2016-09-07 MED ORDER — HYDRALAZINE HCL 20 MG/ML IJ SOLN: 5.0000 mg | INTRAMUSCULAR | Status: DC | PRN

## 2016-09-07 MED ORDER — ZIPRASIDONE MESYLATE 20 MG IM SOLR
10.0000 mg | Freq: Once | INTRAMUSCULAR | Status: AC
  Administered 2016-09-07: 10 mg via INTRAMUSCULAR
  Filled 2016-09-07: qty 20

## 2016-09-07 MED ORDER — BUPROPION HCL ER (XL) 150 MG PO TB24
300.0000 mg | ORAL_TABLET | Freq: Every day | ORAL | Status: DC
  Administered 2016-09-08 – 2016-09-09 (×2): 300 mg via ORAL
  Filled 2016-09-07 (×2): qty 2

## 2016-09-07 MED ORDER — SODIUM CHLORIDE 0.9% FLUSH
3.0000 mL | Freq: Two times a day (BID) | INTRAVENOUS | Status: DC
  Administered 2016-09-09 – 2016-10-05 (×42): 3 mL via INTRAVENOUS

## 2016-09-07 MED ORDER — POTASSIUM CHLORIDE 20 MEQ/15ML (10%) PO SOLN
20.0000 meq | Freq: Once | ORAL | Status: AC
  Administered 2016-09-07: 20 meq via ORAL
  Filled 2016-09-07: qty 15

## 2016-09-07 MED ORDER — ZOLPIDEM TARTRATE 5 MG PO TABS: 5.0000 mg | ORAL_TABLET | Freq: Every evening | ORAL | Status: DC | PRN

## 2016-09-07 MED ORDER — TRAZODONE HCL 150 MG PO TABS
150.0000 mg | ORAL_TABLET | Freq: Every day | ORAL | Status: DC
  Administered 2016-09-07 – 2016-09-08 (×2): 150 mg via ORAL
  Filled 2016-09-07 (×2): qty 1

## 2016-09-07 MED ORDER — BENZTROPINE MESYLATE 0.5 MG PO TABS
0.5000 mg | ORAL_TABLET | Freq: Two times a day (BID) | ORAL | Status: DC
  Administered 2016-09-07 – 2016-09-09 (×4): 0.5 mg via ORAL
  Filled 2016-09-07 (×5): qty 1

## 2016-09-07 MED ORDER — CLONAZEPAM 1 MG PO TABS
2.0000 mg | ORAL_TABLET | Freq: Two times a day (BID) | ORAL | Status: DC | PRN
  Administered 2016-09-08: 2 mg via ORAL
  Filled 2016-09-07: qty 2

## 2016-09-07 NOTE — ED Notes (Signed)
No change in patient restlessness, MD made aware orders obtained.

## 2016-09-07 NOTE — ED Provider Notes (Signed)
[facility] DEPT Provider Note   CSN: [id number] Arrival date & time: 09/07/16  1555     History   Chief Complaint Chief Complaint  Patient presents with  . Altered Mental Status    HPI [name] is a 51 y.o. male.  Pt presents to the ED today with altered mental status.  The pt's sister called EMS because pt has not been able to walk since yesterday around 0900.  He has not been acting right and saying nonsense words.  The pt is altered and unable to give any history.  He does deny any pain.  Pt's sister is here and said that pt lives with her.  Pt fell yesterday morning.  This is when she discovered the altered mental status.  She has no idea if he's been taking his meds correctly.  He also has an HIV medication from 1 year ago.  She was not aware that he has HIV.  There is no diagnosis of this on her chart.      Past Medical History:  Diagnosis Date  . ADD (attention deficit disorder)   . Anxiety   . Arthritis    right hip  . Bipolar 1 disorder ([place])   . Blood in urine   . CKD (chronic kidney disease), stage III   . Creatinine elevation   . Depression    bipolar [facility]  . Diabetes mellitus without complication ([place])   . Family history of anesthesia complication    pt is unsure , but pt father may have been difficult to arouse   . History of kidney stones   . Hypertension   . Hypogonadism male   . Liver fatty degeneration   . Microscopic hematuria    hereditary s/p Urology eval  . Pneumonia 10-2012  . Polysubstance dependence, non-opioid, in remission ([place])    remote  . PTSD (post-traumatic stress disorder)    SOCIAL ANXIETY DISORDER     Patient Active Problem List   Diagnosis Date Noted  . Acute metabolic encephalopathy 40/34/7425  . HLD (hyperlipidemia) 09/07/2016  . GERD (gastroesophageal reflux disease) 09/07/2016  . PTSD (post-traumatic stress disorder) 09/07/2016  . Altered mental status   . Multiple drug overdose 12/27/2015  .  Suicidal overdose ([place]) 12/26/2015  . Suicide attempt by multiple drug overdose ([place]) 12/26/2015  . Ataxia 10/14/2015  . Slurring of speech 10/14/2015  . Leg weakness 10/03/2015  . CRF (chronic renal failure) 10/03/2015  . Hypokalemia 10/03/2015  . Hemiparesis ([place])   . Seizures ([place])   . Primary osteoarthritis of left hip 05/22/2015  . Exposure to potentially hazardous body fluids 02/12/2015  . Loss of weight 09/04/2014  . Dyslipidemia 05/31/2014  . Left hip pain 02/13/2014  . Insomnia 11/11/2013  . Degenerative joint disease (DJD) of hip 08/16/2013  . Acute respiratory failure ([place]) 11/02/2012  . Pneumonia, organism unspecified(486) 11/02/2012  . Pleural effusion 11/02/2012  . Hypoxemia 11/02/2012  . HCAP (healthcare-associated pneumonia) 10/31/2012  . Anemia 10/31/2012  . Osteoarthritis of right hip 11/28/2011  . Right hip pain 07/22/2011  . Bilateral hip pain 07/22/2011  . Low back pain 05/20/2011  . Testicular pain, right 05/20/2011  . Erectile dysfunction 02/17/2011  . Constipation - functional 02/17/2011  . Diabetes type 2, uncontrolled ([place]) 11/06/2010  . Hypogonadism male 05/20/2010  . TOBACCO USER 05/20/2010  . Demoralization and apathy 05/20/2010  . Bipolar disorder ([place]) 01/18/2010  . Depression 01/18/2010  . MICROSCOPIC HEMATURIA 01/18/2010  . SHOULDER PAIN 01/18/2010  .  Anxiety state 12/05/2006  . Essential hypertension 12/05/2006    Past Surgical History:  Procedure Laterality Date  . BACK SURGERY    . CLOSED REDUCTION METACARPAL WITH PERCUTANEOUS PINNING Right   . LUMBAR DISC SURGERY    . TONSILLECTOMY    . TOTAL HIP ARTHROPLASTY Right 08/16/2013   Procedure: TOTAL HIP ARTHROPLASTY ANTERIOR APPROACH;  Surgeon: Hessie Dibble, MD;  Location: [facility];  Service: Orthopedics;  Laterality: Right;  . TOTAL HIP ARTHROPLASTY Left 05/22/2015   Procedure: TOTAL HIP ARTHROPLASTY ANTERIOR APPROACH;  Surgeon: Melrose Nakayama, MD;  Location: [facility];  Service:  Orthopedics;  Laterality: Left;       Home Medications    Prior to Admission medications   Medication Sig Start Date End Date Taking? Authorizing Provider  amLODipine (NORVASC) 5 MG tablet Take 5 mg by mouth daily.  01/11/16 01/10/17 Yes [provider]  atorvastatin (LIPITOR) 10 MG tablet Take 10 mg by mouth daily.   Yes [provider]  benztropine (COGENTIN) 0.5 MG tablet Take 0.5 mg by mouth 2 (two) times daily.   Yes [provider]  buPROPion (WELLBUTRIN XL) 300 MG 24 hr tablet Take 300 mg by mouth daily.   Yes [provider]  clonazePAM (KLONOPIN) 2 MG tablet TAKE 1 TABLET BY MOUTH TWICE DAILY AS NEEDED Patient taking differently: TAKE 1 TABLET BY MOUTH TWICE DAILY AS NEEDED FOR ANXIETY 07/28/16  Yes Plotnikov, Evie Lacks, MD  divalproex (DEPAKOTE) 500 MG DR tablet Take 3 tablets (1,500 mg total) by mouth daily. Patient taking differently: Take 1,500 mg by mouth at bedtime.  06/19/16 06/19/17 Yes Plotnikov, Evie Lacks, MD  insulin lispro (HUMALOG KWIKPEN) 100 UNIT/ML KiwkPen Inject 10 units before each meal (total of 3 times per day). Patient taking differently: Inject 10 Units into the skin 3 (three) times daily. Inject 10 units before each meal (total of 3 times per day). 06/19/16  Yes Plotnikov, Evie Lacks, MD  losartan-hydrochlorothiazide (HYZAAR) 100-12.5 MG tablet Take 1 tablet by mouth daily.   Yes [provider]  methocarbamol (ROBAXIN) 500 MG tablet Take 1 tablet (500 mg total) by mouth 2 (two) times daily. 07/08/16  Yes Charlann Lange, PA-C  oxyCODONE-acetaminophen (PERCOCET/ROXICET) 5-325 MG tablet Take 1-2 tablets by mouth every 4 (four) hours as needed for severe pain. 07/08/16  Yes Upstill, Nehemiah Settle, PA-C  prazosin (MINIPRESS) 1 MG capsule Take 1 mg by mouth at bedtime. For nightmares   Yes [provider]  propranolol (INDERAL) 20 MG tablet Take 1 tablet (20 mg total) by mouth 2 (two) times daily. 06/02/16 06/02/17 Yes Plotnikov,  Evie Lacks, MD  Testosterone (ANDROGEL) 20.25 MG/1.25GM (1.62%) GEL Apply 20.25 mg topically 3 (three) times daily. Place the topically solution under (1) arm pit alternating with each application. Total daily amount of medication equals (60.75 mg). 06/27/16  Yes Plotnikov, Evie Lacks, MD  traMADol (ULTRAM) 50 MG tablet Take 50 mg by mouth every 8 (eight) hours as needed (pain).   Yes [provider]  traZODone (DESYREL) 100 MG tablet Take 150 mg by mouth at bedtime as needed for sleep.   Yes [provider]  traZODone (DESYREL) 150 MG tablet Take 1 tablet (150 mg total) by mouth at bedtime. 06/19/16  Yes Plotnikov, Evie Lacks, MD  TRUVADA 200-300 MG tablet Take 1 tablet by mouth daily. Take 1 tablet by mouth daily Patient taking differently: Take 1 tablet by mouth daily.  02/18/16  Yes Plotnikov, Evie Lacks, MD  [place]  lancets Use to check blood sugars twice a day 06/25/16   Plotnikov, Evie Lacks, MD  Blood Glucose Monitoring Suppl (ACCU-CHEK GUIDE) w/Device KIT 1 Device by Does not apply route daily. Use to check BS twice a day 06/25/16   Plotnikov, Evie Lacks, MD  buPROPion (WELLBUTRIN XL) 150 MG 24 hr tablet Take 2 tablets (300 mg total) by mouth every morning. Patient not taking: Reported on 09/07/2016 06/19/16   Plotnikov, Evie Lacks, MD  Eszopiclone 3 MG TABS Take 3 mg by mouth at bedtime as needed (sleep). Take immediately before bedtime    [provider]  feeding supplement, GLUCERNA SHAKE, (GLUCERNA SHAKE) LIQD Take 237 mLs by mouth daily. Patient not taking: Reported on 09/07/2016 10/05/15   Florencia Reasons, MD  glucose blood (ACCU-CHEK GUIDE) test strip 1 each by Other route 2 (two) times daily. Use to check blood sugars twice a day 06/25/16   Plotnikov, Evie Lacks, MD  insulin lispro (HUMALOG) 100 UNIT/ML injection Inject 0.1 mLs (10 Units total) into the skin 3 (three) times daily with meals. Patient not taking: Reported on 09/07/2016 06/02/16 06/02/17  Plotnikov, Evie Lacks, MD  Insulin Pen Needle (B-D ULTRAFINE III SHORT PEN) 31G X 8 MM MISC Use ad directed with insulin pens. 04/19/16   Plotnikov, Evie Lacks, MD  QUEtiapine (SEROQUEL) 400 MG tablet Take 1 tablet (400 mg total) by mouth at bedtime. 06/19/16   Plotnikov, Evie Lacks, MD  testosterone cypionate (DEPOTESTOSTERONE CYPIONATE) 200 MG/ML injection Inject 0.75 mLs (150 mg total) into the muscle once a week. Patient not taking: Reported on 09/07/2016 03/19/16   Plotnikov, Evie Lacks, MD    Family History Family History  Problem Relation Age of Onset  . Diabetes Father   . Cancer Mother        died of melanoma with mets  . Cervical cancer Sister   . Diabetes Sister   . Other Neg Hx        hypogonadism    Social History Social History  Substance Use Topics  . Smoking status: Former Smoker    Years: 0.00    Types: Cigarettes    Quit date: 02/19/2014  . Smokeless tobacco: Never Used  . Alcohol use No     Allergies   Vicodin [hydrocodone-acetaminophen]   Review of Systems Review of Systems  Unable to perform ROS: Mental status change     Physical Exam Updated Vital Signs BP (!) 139/93   Pulse 87   Temp 99.5 F (37.5 C) (Rectal)   Resp (!) 23   SpO2 98%   Physical Exam  Constitutional: He appears well-developed and well-nourished.  HENT:  Head: Normocephalic and atraumatic.  Right Ear: External ear normal.  Left Ear: External ear normal.  Nose: Nose normal.  Mouth/Throat: Mucous membranes are dry.  Eyes: Conjunctivae and EOM are normal. Pupils are equal, round, and reactive to light.  Neck: Normal range of motion. Neck supple.  Cardiovascular: Normal rate, regular rhythm, normal heart sounds and intact distal pulses.   Pulmonary/Chest: Effort normal and breath sounds normal.  Abdominal: Soft. Bowel sounds are normal.  Musculoskeletal: Normal range of motion.  Neurological: He is alert.  Pt knows his name.  He knows he's in a hospital.  He thinks he drove here (instead of  EMS).  He is moving all 4 extremities.  Skin: Skin is warm.  Bruises all over both arms  Nursing note and vitals reviewed.    ED Treatments / Results  Labs (all labs  ordered are listed, but only abnormal results are displayed) Labs Reviewed  CBC WITH DIFFERENTIAL/PLATELET - Abnormal; Notable for the following:       Result Value   Platelets 103 (*)    All other components within normal limits  AMMONIA - Abnormal; Notable for the following:    Ammonia 43 (*)    All other components within normal limits  COMPREHENSIVE METABOLIC PANEL - Abnormal; Notable for the following:    Potassium 3.4 (*)    Glucose, Bld 144 (*)    BUN 22 (*)    Creatinine, Ser 2.19 (*)    AST 46 (*)    GFR calc non Af Amer 34 (*)    GFR calc Af Amer 39 (*)    All other components within normal limits  URINALYSIS, ROUTINE W REFLEX MICROSCOPIC - Abnormal; Notable for the following:    Glucose, UA >=500 (*)    Hgb urine dipstick SMALL (*)    Ketones, ur 20 (*)    Protein, ur 30 (*)    Squamous Epithelial / LPF 0-5 (*)    All other components within normal limits  RAPID URINE DRUG SCREEN, HOSP PERFORMED  ETHANOL  PROTIME-INR  VALPROIC ACID LEVEL  TROPONIN I    EKG  EKG Interpretation  Date/Time:  Sunday Sep 07 2016 16:17:57 EDT Ventricular Rate:  89 PR Interval:    QRS Duration: 106 QT Interval:  372 QTC Calculation: 453 R Axis:   50 Text Interpretation:  Sinus rhythm Borderline ST depression, diffuse leads mild st depression similar in inferior leads.  more pronounced in lateral leads compared to prior ekg Confirmed by Isla Pence [phone]) on 09/07/2016 4:39:32 PM       Radiology Dg Chest 2 View  Result Date: 09/07/2016 CLINICAL DATA:  Acute mental status change EXAM: CHEST  2 VIEW COMPARISON:  July 07, 2016 FINDINGS: The heart size and mediastinal contours are within normal limits. Both lungs are clear. The visualized skeletal structures are unremarkable. IMPRESSION: No active  cardiopulmonary disease. Electronically Signed   By: Dorise Bullion III M.D   On: 09/07/2016 17:17   Ct Head Wo Contrast  Result Date: 09/07/2016 CLINICAL DATA:  Cognitive decline. Recent falls. Hallucinations. Restlessness and confusion. Altered mental status. EXAM: CT HEAD WITHOUT CONTRAST TECHNIQUE: Contiguous axial images were obtained from the base of the skull through the vertex without intravenous contrast. COMPARISON:  10/03/2015 FINDINGS: Brain: The brainstem, cerebellum, cerebral peduncles, thalami, basal ganglia, basilar cisterns, and ventricular system appear within normal limits. Minimal findings of chronic ischemic microvascular white matter disease. Mildly advanced cerebral atrophy for age. No intracranial hemorrhage, mass lesion, or acute CVA. Vascular: Stable dolichoectatic left vertebral artery. Skull: Unremarkable Sinuses/Orbits: Unremarkable Other: No supplemental non-categorized findings. IMPRESSION: 1. Mildly advanced cerebral atrophy for age. Minimal chronic ischemic microvascular white matter disease. 2. No specific findings to explain acute mental status changes. 3. Stable dolichoectatic left vertebral artery Electronically Signed   By: Van Clines M.D.   On: 09/07/2016 16:52    Procedures Procedures (including critical care time)  Medications Ordered in ED Medications  sodium chloride 0.9 % bolus 1,000 mL (0 mLs Intravenous Stopped 09/07/16 2012)    And  0.9 %  sodium chloride infusion ( Intravenous New Bag/Given 09/07/16 2112)  LORazepam (ATIVAN) injection 2 mg (2 mg Intravenous Given 09/07/16 2005)  sodium chloride 0.9 % bolus 1,000 mL (0 mLs Intravenous Stopped 09/07/16 2111)  ziprasidone (GEODON) injection 10 mg (10 mg Intramuscular Given 09/07/16 2036)  sterile water (preservative free) injection (1.2 mLs  Given 09/07/16 2037)  LORazepam (ATIVAN) injection 2 mg (2 mg Intravenous Given 09/07/16 2112)     Initial Impression / Assessment and Plan / ED Course  I  have reviewed the triage vital signs and the nursing notes.  Pertinent labs & imaging results that were available during my care of the patient were reviewed by me and considered in my medical decision making (see chart for details).  CRITICAL CARE Performed by: Isla Pence   Total critical care time: 30 minutes  Critical care time was exclusive of separately billable procedures and treating other patients.  Critical care was necessary to treat or prevent imminent or life-threatening deterioration.  Critical care was time spent personally by me on the following activities: development of treatment plan with patient and/or surrogate as well as nursing, discussions with consultants, evaluation of patient's response to treatment, examination of patient, obtaining history from patient or surrogate, ordering and performing treatments and interventions, ordering and review of laboratory studies, ordering and review of radiographic studies, pulse oximetry and re-evaluation of patient's condition.     Despite ativan and geodon, pt is still very agitated.  He keeps pulling off his clothes and the sheets.  He is trying to pull off his monitor leads.  More ativan given.  I am not sure of the etiology of his ms change.  It may be psych or a medical problem of which we have not discovered.  Pt is not clear for a psych eval.  I spoke with Dr. Blaine Hamper ([facility]) who will admit him.  Final Clinical Impressions(s) / ED Diagnoses   Final diagnoses:  Transient alteration of awareness  CRI (chronic renal insufficiency), stage 3 (moderate)    New Prescriptions New Prescriptions   No medications on file     Isla Pence, MD 09/07/16 2113

## 2016-09-07 NOTE — Plan of Care (Signed)
Problem: Safety: Goal: Ability to remain free from injury will improve Outcome: Not Progressing Pt is hallucinating  and pulling at lines , unable to keep pt in be md called orders received

## 2016-09-07 NOTE — H&P (Signed)
History and Physical    [name] JIR:[id number] DOB: 07-May-1965 DOA: 09/07/2016  Referring MD/NP/PA:   PCP: Beverly Sessions   Patient coming from:  The patient is coming from home.  At baseline, pt is partially dependent for most of ADL  Chief Complaint: AMS and fall  HPI: [name] is a 51 y.o. male with medical history significant of PTSD, bipolar disorder, ADD, depression, anxiety, GERD, chronic kidney disease-stage III, seizure, hypertension, hyperlipidemia, diabetes mellitus, who presents with altered mental status.  Patient has AMS and is unable to provide medical history, therefore, most of the history is obtained by discussing the case with ED physician, per EMS report, and with the nursing staff. Per nurse report, patient's sister reported that patient has been cognitively declining for the past year. Patient had multiple falls at home. He has not been compliant to his medications. He only takes when he thinks he needs them. Since yesterday morning, patient become more confused. Pt is restless. Pt moves all extremities, but cannot walk (usually pt is unsteady holding onto objects as he is walking). Pt has auditory and visual hallucinations. When I saw pt in ED, pt is confused and restless. He is not oriented x 3 and could not provide any history. He moves all extremities. No facial droop or slurred speech. He has bruises in both arms. No active cough, nausea, vomiting or diarrhea noted. He does not seem to have chest pain or abdominal pain.  ED Course: pt was found to have WBC 6.1, INR 1.09, negative troponin, negative UDS, negative urinalysis, valproic acid therapeutic 78,  Alcohol<5, potassium 3.4, stable renal function, temperature 99.5, heart rate in 90s, oxygen sat 98% on room air, negative chest x-ray, negative CT head for acute intracranial abnormalities. Patient is admitted to stepdown as inpatient.  Review of Systems: could not be reviewed with due to altered mental  status.  Allergy:  Allergies  Allergen Reactions  . Vicodin [Hydrocodone-Acetaminophen] Itching    Past Medical History:  Diagnosis Date  . ADD (attention deficit disorder)   . Anxiety   . Arthritis    right hip  . Bipolar 1 disorder ([place])   . Blood in urine   . CKD (chronic kidney disease), stage III   . Creatinine elevation   . Depression    bipolar [facility]  . Diabetes mellitus without complication ([place])   . Family history of anesthesia complication    pt is unsure , but pt father may have been difficult to arouse   . History of kidney stones   . Hypertension   . Hypogonadism male   . Liver fatty degeneration   . Microscopic hematuria    hereditary s/p Urology eval  . Pneumonia 10-2012  . Polysubstance dependence, non-opioid, in remission ([place])    remote  . PTSD (post-traumatic stress disorder)    SOCIAL ANXIETY DISORDER     Past Surgical History:  Procedure Laterality Date  . BACK SURGERY    . CLOSED REDUCTION METACARPAL WITH PERCUTANEOUS PINNING Right   . LUMBAR DISC SURGERY    . TONSILLECTOMY    . TOTAL HIP ARTHROPLASTY Right 08/16/2013   Procedure: TOTAL HIP ARTHROPLASTY ANTERIOR APPROACH;  Surgeon: Hessie Dibble, MD;  Location: [facility];  Service: Orthopedics;  Laterality: Right;  . TOTAL HIP ARTHROPLASTY Left 05/22/2015   Procedure: TOTAL HIP ARTHROPLASTY ANTERIOR APPROACH;  Surgeon: Melrose Nakayama, MD;  Location: [facility];  Service: Orthopedics;  Laterality: Left;    Social History:  reports  that he quit smoking about 2 years ago. His smoking use included Cigarettes. He quit after 0.00 years of use. He has never used smokeless tobacco. He reports that he does not drink alcohol or use drugs.  Family History:  Family History  Problem Relation Age of Onset  . Diabetes Father   . Cancer Mother        died of melanoma with mets  . Cervical cancer Sister   . Diabetes Sister   . Other Neg Hx        hypogonadism     Prior to Admission medications    Medication Sig Start Date End Date Taking? Authorizing Provider  amLODipine (NORVASC) 5 MG tablet Take 5 mg by mouth daily.  01/11/16 01/10/17 Yes [provider]  atorvastatin (LIPITOR) 10 MG tablet Take 10 mg by mouth daily.   Yes [provider]  benztropine (COGENTIN) 0.5 MG tablet Take 0.5 mg by mouth 2 (two) times daily.   Yes [provider]  buPROPion (WELLBUTRIN XL) 300 MG 24 hr tablet Take 300 mg by mouth daily.   Yes [provider]  clonazePAM (KLONOPIN) 2 MG tablet TAKE 1 TABLET BY MOUTH TWICE DAILY AS NEEDED Patient taking differently: TAKE 1 TABLET BY MOUTH TWICE DAILY AS NEEDED FOR ANXIETY 07/28/16  Yes Plotnikov, Evie Lacks, MD  divalproex (DEPAKOTE) 500 MG DR tablet Take 3 tablets (1,500 mg total) by mouth daily. Patient taking differently: Take 1,500 mg by mouth at bedtime.  06/19/16 06/19/17 Yes Plotnikov, Evie Lacks, MD  insulin lispro (HUMALOG KWIKPEN) 100 UNIT/ML KiwkPen Inject 10 units before each meal (total of 3 times per day). Patient taking differently: Inject 10 Units into the skin 3 (three) times daily. Inject 10 units before each meal (total of 3 times per day). 06/19/16  Yes Plotnikov, Evie Lacks, MD  losartan-hydrochlorothiazide (HYZAAR) 100-12.5 MG tablet Take 1 tablet by mouth daily.   Yes [provider]  methocarbamol (ROBAXIN) 500 MG tablet Take 1 tablet (500 mg total) by mouth 2 (two) times daily. 07/08/16  Yes Charlann Lange, PA-C  oxyCODONE-acetaminophen (PERCOCET/ROXICET) 5-325 MG tablet Take 1-2 tablets by mouth every 4 (four) hours as needed for severe pain. 07/08/16  Yes Upstill, Nehemiah Settle, PA-C  prazosin (MINIPRESS) 1 MG capsule Take 1 mg by mouth at bedtime. For nightmares   Yes [provider]  propranolol (INDERAL) 20 MG tablet Take 1 tablet (20 mg total) by mouth 2 (two) times daily. 06/02/16 06/02/17 Yes Plotnikov, Evie Lacks, MD  Testosterone (ANDROGEL) 20.25 MG/1.25GM (1.62%) GEL Apply 20.25 mg topically 3 (three)  times daily. Place the topically solution under (1) arm pit alternating with each application. Total daily amount of medication equals (60.75 mg). 06/27/16  Yes Plotnikov, Evie Lacks, MD  traMADol (ULTRAM) 50 MG tablet Take 50 mg by mouth every 8 (eight) hours as needed (pain).   Yes [provider]  traZODone (DESYREL) 100 MG tablet Take 150 mg by mouth at bedtime as needed for sleep.   Yes [provider]  traZODone (DESYREL) 150 MG tablet Take 1 tablet (150 mg total) by mouth at bedtime. 06/19/16  Yes Plotnikov, Evie Lacks, MD  TRUVADA 200-300 MG tablet Take 1 tablet by mouth daily. Take 1 tablet by mouth daily Patient taking differently: Take 1 tablet by mouth daily.  02/18/16  Yes Plotnikov, Evie Lacks, MD  ACCU-CHEK SOFTCLIX LANCETS lancets Use to check blood sugars twice a day 06/25/16   Plotnikov, Evie Lacks, MD  Blood Glucose Monitoring  Suppl (ACCU-CHEK GUIDE) w/Device KIT 1 Device by Does not apply route daily. Use to check BS twice a day 06/25/16   Plotnikov, Evie Lacks, MD  buPROPion (WELLBUTRIN XL) 150 MG 24 hr tablet Take 2 tablets (300 mg total) by mouth every morning. Patient not taking: Reported on 09/07/2016 06/19/16   Plotnikov, Evie Lacks, MD  Eszopiclone 3 MG TABS Take 3 mg by mouth at bedtime as needed (sleep). Take immediately before bedtime    [provider]  feeding supplement, GLUCERNA SHAKE, (GLUCERNA SHAKE) LIQD Take 237 mLs by mouth daily. Patient not taking: Reported on 09/07/2016 10/05/15   Florencia Reasons, MD  glucose blood (ACCU-CHEK GUIDE) test strip 1 each by Other route 2 (two) times daily. Use to check blood sugars twice a day 06/25/16   Plotnikov, Evie Lacks, MD  insulin lispro (HUMALOG) 100 UNIT/ML injection Inject 0.1 mLs (10 Units total) into the skin 3 (three) times daily with meals. Patient not taking: Reported on 09/07/2016 06/02/16 06/02/17  Plotnikov, Evie Lacks, MD  Insulin Pen Needle (B-D ULTRAFINE III SHORT PEN) 31G X 8 MM MISC Use ad directed with insulin  pens. 04/19/16   Plotnikov, Evie Lacks, MD  QUEtiapine (SEROQUEL) 400 MG tablet Take 1 tablet (400 mg total) by mouth at bedtime. 06/19/16   Plotnikov, Evie Lacks, MD  testosterone cypionate (DEPOTESTOSTERONE CYPIONATE) 200 MG/ML injection Inject 0.75 mLs (150 mg total) into the muscle once a week. Patient not taking: Reported on 09/07/2016 03/19/16   PlotnikovEvie Lacks, MD    Physical Exam: Vitals:   09/07/16 1615 09/07/16 1900 09/07/16 2000 09/07/16 2047  BP:  (!) 139/92 (!) 144/88 (!) 139/93  Pulse:    87  Resp:  (!) 29 (!) 25 (!) 23  Temp: 99.5 F (37.5 C)     TempSrc: Rectal     SpO2:    98%   General: Not in acute distress. Restless in bed. HEENT:       Eyes: PERRL, EOMI, no scleral icterus.       ENT: No discharge from the ears and nose, no pharynx injection, no tonsillar enlargement.        Neck: No JVD, no bruit, no mass felt. Heme: No neck lymph node enlargement. Cardiac: S1/S2, RRR, No murmurs, No gallops or rubs. Respiratory: No rales, wheezing, rhonchi or rubs. GI: Soft, nondistended, nontender, no organomegaly, BS present. GU: No hematuria Ext: No pitting leg edema bilaterally. 2+DP/PT pulse bilaterally. Musculoskeletal: No joint deformities, No joint redness or warmth, no limitation of ROM in spin. Skin: No rashes. Has bruises in both arms. Neuro: confused, not oriented X3, cranial nerves II-XII grossly intact, moves all extremities normally. Muscle strength 5/5 in all extremities. Brachial reflex 2+ bilaterally. Negative Babinski's sign.  Psych: Restless.  Labs on Admission: I have personally reviewed following labs and imaging studies  CBC:  Recent Labs Lab 09/07/16 1734  WBC 6.1  NEUTROABS 4.3  HGB 14.1  HCT 39.5  MCV 86.6  PLT 709*   Basic Metabolic Panel:  Recent Labs Lab 09/07/16 1734  NA 135  K 3.4*  CL 103  CO2 23  GLUCOSE 144*  BUN 22*  CREATININE 2.19*  CALCIUM 9.2   GFR: CrCl cannot be calculated (Unknown ideal weight.). Liver  Function Tests:  Recent Labs Lab 09/07/16 1734  AST 46*  ALT 30  ALKPHOS 106  BILITOT 1.1  PROT 6.8  ALBUMIN 4.0   No results for input(s): LIPASE, AMYLASE in the last 168 hours.  Recent Labs Lab 09/07/16 1734  AMMONIA 43*   Coagulation Profile:  Recent Labs Lab 09/07/16 1734  INR 1.09   Cardiac Enzymes:  Recent Labs Lab 09/07/16 1733  TROPONINI <0.03   BNP (last 3 results) No results for input(s): PROBNP in the last 8760 hours. HbA1C: No results for input(s): HGBA1C in the last 72 hours. CBG: No results for input(s): GLUCAP in the last 168 hours. Lipid Profile: No results for input(s): CHOL, HDL, LDLCALC, TRIG, CHOLHDL, LDLDIRECT in the last 72 hours. Thyroid Function Tests: No results for input(s): TSH, T4TOTAL, FREET4, T3FREE, THYROIDAB in the last 72 hours. Anemia Panel: No results for input(s): VITAMINB12, FOLATE, FERRITIN, TIBC, IRON, RETICCTPCT in the last 72 hours. Urine analysis:    Component Value Date/Time   COLORURINE YELLOW 09/07/2016 1939   APPEARANCEUR CLEAR 09/07/2016 1939   LABSPEC 1.017 09/07/2016 1939   PHURINE 6.0 09/07/2016 1939   GLUCOSEU >=500 (A) 09/07/2016 1939   GLUCOSEU >=1000 (A) 06/02/2016 0844   HGBUR SMALL (A) 09/07/2016 1939   BILIRUBINUR NEGATIVE 09/07/2016 1939   KETONESUR 20 (A) 09/07/2016 1939   PROTEINUR 30 (A) 09/07/2016 1939   UROBILINOGEN 0.2 06/02/2016 0844   NITRITE NEGATIVE 09/07/2016 1939   LEUKOCYTESUR NEGATIVE 09/07/2016 1939   Sepsis Labs: _0 (procalcitonin:4,lacticidven:4) )No results found for this or any previous visit (from the past 240 hour(s)).   Radiological Exams on Admission: Dg Chest 2 View  Result Date: 09/07/2016 CLINICAL DATA:  Acute mental status change EXAM: CHEST  2 VIEW COMPARISON:  July 07, 2016 FINDINGS: The heart size and mediastinal contours are within normal limits. Both lungs are clear. The visualized skeletal structures are unremarkable. IMPRESSION: No active  cardiopulmonary disease. Electronically Signed   By: Dorise Bullion III M.D   On: 09/07/2016 17:17   Ct Head Wo Contrast  Result Date: 09/07/2016 CLINICAL DATA:  Cognitive decline. Recent falls. Hallucinations. Restlessness and confusion. Altered mental status. EXAM: CT HEAD WITHOUT CONTRAST TECHNIQUE: Contiguous axial images were obtained from the base of the skull through the vertex without intravenous contrast. COMPARISON:  10/03/2015 FINDINGS: Brain: The brainstem, cerebellum, cerebral peduncles, thalami, basal ganglia, basilar cisterns, and ventricular system appear within normal limits. Minimal findings of chronic ischemic microvascular white matter disease. Mildly advanced cerebral atrophy for age. No intracranial hemorrhage, mass lesion, or acute CVA. Vascular: Stable dolichoectatic left vertebral artery. Skull: Unremarkable Sinuses/Orbits: Unremarkable Other: No supplemental non-categorized findings. IMPRESSION: 1. Mildly advanced cerebral atrophy for age. Minimal chronic ischemic microvascular white matter disease. 2. No specific findings to explain acute mental status changes. 3. Stable dolichoectatic left vertebral artery Electronically Signed   By: Van Clines M.D.   On: 09/07/2016 16:52     EKG:  Not done in ED, will get one.   Assessment/Plan Principal Problem:   Acute metabolic encephalopathy Active Problems:   Bipolar disorder (HCC)   Anxiety state   Depression   Essential hypertension   Diabetes type 2, uncontrolled (HCC)   Hypokalemia   Seizures (HCC)   HLD (hyperlipidemia)   PTSD (post-traumatic stress disorder)   Acute encephalopathy   CRI (chronic renal insufficiency), stage 3 (moderate)   Fall   Acute metabolic encephalopathy: Etiology is not clear. No signs of infection. Urinalysis negative. Chest x-ray negative. UDS negative. CT head is negative for intracranial abnormalities. Per his sister report, patient has been declining cognitively in the past year.  He has not been compliant to his medications, indicating possible worsening psych issue (PTSD, bipolar, ADD, depression, anxiety) and withdraw  from benzo (he has Klonopin on medication list, but UDS is negative). Patient moves all extremities, no focal neurologic findings, less likely to have stroke. Pt was given 10 mg of Geodon and 2 mg of ativan x 2 in ED, without significant improvement.  -will admit to SDU as inpt -resume home meds -Frequent neurologic checks -IV fluids: 2 L normal saline, then followed by 1 25 mL per hour  Psych issue: Included bipolar disorder, depression, anxiety, PTSD, ADD: Per family that, patient is very likely not taking his psych medications. -Resume home medications: Wellbutrin, Cogentin, Klonopin, Depakote, Seroquel  DM-II: Last A1c 9.4 on 06/02/16, poorly controled. Patient is taking Humalog  at home -SSI  HLD: Last LDL was 28 on 60/14/17 -hold lipitor due to ADT 46   Essential hypertension: Blood pressure 159/112 -continue amlodipine, propranolol, prazosin -IV hydralazine when necessary -Hold Hyzaar since pt may not take his home Bp meds and will not start all at the same time  Seizure: Valproic acid level 78, which is therapeutic -seizure precaution -continue Depakot  Fall: likely due to AMS and cognitive decline. Ct-head negative  -PT/OT when mental status improves (not ordered yet)  CKD-III: stable. Baseline creatinine 2.2-2.7. His creatinine is at 2.19, BUN 22.  -f/u by BMP  Hypokalemia: Potassium is 3.4. -repeleted.   DVT ppx: SQ Heparin    Code Status: Full code Family Communication: None at bed side.   Disposition Plan:  Anticipate discharge back to previous home environment Consults called:  none Admission status: SDU/inpation       Date of Service 09/07/2016    Ivor Costa [facility] Pager [phone]  If 7PM-7AM, please contact night-coverage [email or web address] Password [email or web address] 09/07/2016, 10:13 PM

## 2016-09-07 NOTE — ED Notes (Signed)
Report attempted 

## 2016-09-07 NOTE — ED Notes (Signed)
Sister and her husband at bedside.  Pt lives with them, sister reports for the past year pt has declined cognitively.  Sister has to leave notes all over house for pt to remind him of certain things.  Pt has fell down steps inside home several times and does not take medications or only takes if he thinks he needs them.

## 2016-09-07 NOTE — ED Triage Notes (Signed)
To room via EMS.  Pts sister reported to EMS onset yesterday 9am pt has been confused, restless, word salad.  Pt has bruising and scrapes to both arms.  Per sister since yesterday pt has been unable to walk (usually pt is unsteady holding onto objects as he is walking).  Sister reports pt is non compliant with meds.  Pt having auditory and visual hallucinations.  Alert to person, place (knows he is in hospital but thought he was at [facility]).

## 2016-09-07 NOTE — ED Notes (Signed)
Called main lab to add troponin

## 2016-09-07 NOTE — Progress Notes (Signed)
Pt from ed restless pulling at Iv lines trying to get oob, pt is talking to people who are not there. Attempted to reorient pt with no success.  Pt has already received Geodon and 4 mg of ativan in the last 2 hours. Paged Dr. Blaine Hamper who didn't wonet to give more sedative medications and ordered medical restraints will continue to monitor

## 2016-09-08 LAB — BASIC METABOLIC PANEL
Anion gap: 8 (ref 5–15)
BUN: 16 mg/dL (ref 6–20)
CALCIUM: 8.6 mg/dL — AB (ref 8.9–10.3)
CO2: 23 mmol/L (ref 22–32)
CREATININE: 1.82 mg/dL — AB (ref 0.61–1.24)
Chloride: 107 mmol/L (ref 101–111)
GFR calc Af Amer: 49 mL/min — ABNORMAL LOW (ref >60)
GFR, EST NON AFRICAN AMERICAN: 42 mL/min — AB (ref >60)
GLUCOSE: 96 mg/dL (ref 65–99)
POTASSIUM: 3.3 mmol/L — AB (ref 3.5–5.1)
SODIUM: 138 mmol/L (ref 135–145)

## 2016-09-08 LAB — CBC
HEMATOCRIT: 37.3 % — AB (ref 39.0–52.0)
Hemoglobin: 12.9 g/dL — ABNORMAL LOW (ref 13.0–17.0)
MCH: 30.1 pg (ref 26.0–34.0)
MCHC: 34.6 g/dL (ref 30.0–36.0)
MCV: 87.1 fL (ref 78.0–100.0)
PLATELETS: 88 10*3/uL — AB (ref 150–400)
RBC: 4.28 MIL/uL (ref 4.22–5.81)
RDW: 13.1 % (ref 11.5–15.5)
WBC: 5.5 10*3/uL (ref 4.0–10.5)

## 2016-09-08 LAB — GLUCOSE, CAPILLARY
GLUCOSE-CAPILLARY: 119 mg/dL — AB (ref 65–99)
GLUCOSE-CAPILLARY: 77 mg/dL (ref 65–99)
Glucose-Capillary: 112 mg/dL — ABNORMAL HIGH (ref 65–99)
Glucose-Capillary: 165 mg/dL — ABNORMAL HIGH (ref 65–99)
Glucose-Capillary: 130 mg/dL — ABNORMAL HIGH (ref 65–99)

## 2016-09-08 LAB — MRSA PCR SCREENING: MRSA by PCR: NEGATIVE

## 2016-09-08 MED ORDER — KCL IN DEXTROSE-NACL 40-5-0.9 MEQ/L-%-% IV SOLN
INTRAVENOUS | Status: DC
  Administered 2016-09-08 – 2016-09-12 (×6): via INTRAVENOUS
  Filled 2016-09-08 (×7): qty 1000

## 2016-09-08 MED ORDER — LORAZEPAM 2 MG/ML IJ SOLN
1.0000 mg | INTRAMUSCULAR | Status: DC | PRN
  Administered 2016-09-09: 2 mg via INTRAVENOUS
  Filled 2016-09-08: qty 1

## 2016-09-08 MED ORDER — HALOPERIDOL LACTATE 5 MG/ML IJ SOLN
5.0000 mg | Freq: Four times a day (QID) | INTRAMUSCULAR | Status: DC | PRN
  Administered 2016-09-08: 5 mg via INTRAVENOUS
  Filled 2016-09-08: qty 1

## 2016-09-08 MED ORDER — AMLODIPINE BESYLATE 10 MG PO TABS
10.0000 mg | ORAL_TABLET | Freq: Every day | ORAL | Status: DC
  Administered 2016-09-09 – 2016-09-14 (×6): 10 mg via ORAL
  Filled 2016-09-08 (×9): qty 1

## 2016-09-08 MED ORDER — INSULIN ASPART 100 UNIT/ML ~~LOC~~ SOLN
0.0000 [IU] | SUBCUTANEOUS | Status: DC
  Administered 2016-09-09 (×5): 1 [IU] via SUBCUTANEOUS

## 2016-09-08 NOTE — Progress Notes (Signed)
[facility] TEAM 1 - Stepdown/ICU TEAM  [name]  QQV:[id number] DOB: 14-Sep-1965 DOA: 09/07/2016 PCP: Beverly Sessions    Brief Narrative:  51 y.o. male with history significant of PTSD, bipolar disorder, ADD, depression, anxiety, GERD, CKD stage III, seizure d/o, HTN, HLD, and DM who presented with altered mental status.  The patient's sister reported he had been cognitively declining for the past year w/ multiple falls at home. He has not been compliant w/ his medications. He had recently become more confused and restless, reporting auditory and visual hallucinations. CT head in the ED was w/o acute findings.    Subjective: The patient is sedate at the time of my exam.  He does not awaken to my voice or my exam.  He cannot provide a reliable history.  His nurse however informs me he has had multiple episodes this morning when he awakens and becomes quite agitated.  He is pulled out his own IV.  He is made attempts to get out of bed.  Assessment & Plan:  Acute metabolic encephalopathy Ammonia modestly elevated at 43 - ethanol level undetectable - UDS negative - check P32, folic acid, RPR, HIV, and TSH - suspect this may be Psychiatric in origin - once metabolic source ruled out will consider Psych consultation   Complex psychiatric history - PTSD, bipolar disorder, anxiety disorder, ADD Continue usual home psychiatric medications  Seizure disorder Valproic acid level therapeutic at admission - no evidence of active seizure activity  CK-MB stage III Baseline creatinine 2.2-2.7 - renal function stable  Recent Labs Lab 09/07/16 1734 09/08/16 0220  CREATININE 2.19* 1.82*    DM 2 CBG presently reasonably controlled  HTN Blood pressure variable but in general recently controlled - no change in treatment plan today  HLD  Hypokalemia Likely due to poor intake - supplement and follow  DVT prophylaxis: Subcutaneous heparin Code Status: FULL CODE Family Communication: no family present  at time of exam  Disposition Plan: SDU  Consultants:  None  Procedures: None  Antimicrobials:  None  Objective: Blood pressure 133/77, pulse 65, temperature 98.3 F (36.8 C), temperature source Axillary, resp. rate 13, SpO2 96 %.  Intake/Output Summary (Last 24 hours) at 09/08/16 1314 Last data filed at 09/08/16 0825  Gross per 24 hour  Intake             3590 ml  Output             1075 ml  Net             2515 ml    Examination: General: No acute respiratory distress Lungs: Clear to auscultation bilaterally without wheezes or crackles Cardiovascular: Regular rate and rhythm without murmur gallop or rub normal S1 and S2 Abdomen: Nontender, nondistended, soft, bowel sounds positive, no rebound, no ascites, no appreciable mass Extremities: No significant cyanosis, clubbing, or edema bilateral lower extremities  CBC:  Recent Labs Lab 09/07/16 1734 09/08/16 0220  WBC 6.1 5.5  NEUTROABS 4.3  --   HGB 14.1 12.9*  HCT 39.5 37.3*  MCV 86.6 87.1  PLT 103* 88*   Basic Metabolic Panel:  Recent Labs Lab 09/07/16 1734 09/08/16 0220  NA 135 138  K 3.4* 3.3*  CL 103 107  CO2 23 23  GLUCOSE 144* 96  BUN 22* 16  CREATININE 2.19* 1.82*  CALCIUM 9.2 8.6*   GFR: CrCl cannot be calculated (Unknown ideal weight.).  Liver Function Tests:  Recent Labs Lab 09/07/16 1734  AST 46*  ALT  30  ALKPHOS 106  BILITOT 1.1  PROT 6.8  ALBUMIN 4.0    Recent Labs Lab 09/07/16 1734  AMMONIA 43*    Coagulation Profile:  Recent Labs Lab 09/07/16 1734  INR 1.09    Cardiac Enzymes:  Recent Labs Lab 09/07/16 1733  TROPONINI <0.03    HbA1C: Hgb A1c MFr Bld  Date/Time Value Ref Range Status  06/02/2016 08:44 AM 9.4 (H) 4.6 - 6.5 % Final    Comment:    Glycemic Control Guidelines for People with Diabetes:Non Diabetic:  <6%Goal of Therapy: <7%Additional Action Suggested:  >8%   03/19/2016 03:40 PM 7.2 (H) 4.6 - 6.5 % Final    Comment:    Glycemic Control  Guidelines for People with Diabetes:Non Diabetic:  <6%Goal of Therapy: <7%Additional Action Suggested:  >8%     CBG:  Recent Labs Lab 09/07/16 2330 09/08/16 0829 09/08/16 1156  GLUCAP 77 112* 165*    Recent Results (from the past 240 hour(s))  MRSA PCR Screening     Status: None   Collection Time: 09/08/16  3:22 AM  Result Value Ref Range Status   MRSA by PCR NEGATIVE NEGATIVE Final    Comment:        The GeneXpert MRSA Assay (FDA approved for NASAL specimens only), is one component of a comprehensive MRSA colonization surveillance program. It is not intended to diagnose MRSA infection nor to guide or monitor treatment for MRSA infections.      Scheduled Meds: . amLODipine  5 mg Oral Daily  . benztropine  0.5 mg Oral BID  . buPROPion  300 mg Oral Daily  . divalproex  1,500 mg Oral QHS  . heparin  5,000 Units Subcutaneous Q8H  . losartan  100 mg Oral Daily   And  . hydrochlorothiazide  12.5 mg Oral Daily  . insulin aspart  0-5 Units Subcutaneous QHS  . insulin aspart  0-9 Units Subcutaneous TID WC  . propranolol  20 mg Oral BID  . QUEtiapine  400 mg Oral QHS  . sodium chloride flush  3 mL Intravenous Q12H  . traZODone  150 mg Oral QHS     LOS: 1 day   Cherene Altes, MD [facility] Office  [phone] Pager - Text Page per Amion as per below:  On-Call/Text Page:      [email or web address]      password [email or web address]  If 7PM-7AM, please contact night-coverage [email or web address] Password [email or web address] 09/08/2016, 1:14 PM

## 2016-09-08 NOTE — Plan of Care (Signed)
Problem: Safety: Goal: Ability to remain free from injury will improve Outcome: Not Progressing Pt continue to attempt to get oob and will not leave telemetry or IV line in place

## 2016-09-09 DIAGNOSIS — F4310 Post-traumatic stress disorder, unspecified: Secondary | ICD-10-CM

## 2016-09-09 DIAGNOSIS — E785 Hyperlipidemia, unspecified: Secondary | ICD-10-CM

## 2016-09-09 DIAGNOSIS — E876 Hypokalemia: Secondary | ICD-10-CM

## 2016-09-09 DIAGNOSIS — K219 Gastro-esophageal reflux disease without esophagitis: Secondary | ICD-10-CM

## 2016-09-09 DIAGNOSIS — F411 Generalized anxiety disorder: Secondary | ICD-10-CM

## 2016-09-09 DIAGNOSIS — R45851 Suicidal ideations: Secondary | ICD-10-CM

## 2016-09-09 DIAGNOSIS — Z87891 Personal history of nicotine dependence: Secondary | ICD-10-CM

## 2016-09-09 DIAGNOSIS — R569 Unspecified convulsions: Secondary | ICD-10-CM

## 2016-09-09 LAB — CBC
HEMATOCRIT: 43.4 % (ref 39.0–52.0)
Hemoglobin: 15.2 g/dL (ref 13.0–17.0)
MCH: 30.8 pg (ref 26.0–34.0)
MCHC: 35 g/dL (ref 30.0–36.0)
MCV: 88 fL (ref 78.0–100.0)
PLATELETS: 105 10*3/uL — AB (ref 150–400)
RBC: 4.93 MIL/uL (ref 4.22–5.81)
RDW: 13.1 % (ref 11.5–15.5)
WBC: 9.2 10*3/uL (ref 4.0–10.5)

## 2016-09-09 LAB — BASIC METABOLIC PANEL
Anion gap: 10 (ref 5–15)
BUN: 15 mg/dL (ref 6–20)
CHLORIDE: 104 mmol/L (ref 101–111)
CO2: 25 mmol/L (ref 22–32)
CREATININE: 1.96 mg/dL — AB (ref 0.61–1.24)
Calcium: 9.6 mg/dL (ref 8.9–10.3)
GFR calc Af Amer: 45 mL/min — ABNORMAL LOW (ref >60)
GFR calc non Af Amer: 39 mL/min — ABNORMAL LOW (ref >60)
Glucose, Bld: 101 mg/dL — ABNORMAL HIGH (ref 65–99)
POTASSIUM: 3.2 mmol/L — AB (ref 3.5–5.1)
Sodium: 139 mmol/L (ref 135–145)

## 2016-09-09 LAB — TSH: TSH: 1.913 u[IU]/mL (ref 0.350–4.500)

## 2016-09-09 LAB — GLUCOSE, CAPILLARY
GLUCOSE-CAPILLARY: 122 mg/dL — AB (ref 65–99)
GLUCOSE-CAPILLARY: 135 mg/dL — AB (ref 65–99)
GLUCOSE-CAPILLARY: 146 mg/dL — AB (ref 65–99)
Glucose-Capillary: 119 mg/dL — ABNORMAL HIGH (ref 65–99)
Glucose-Capillary: 140 mg/dL — ABNORMAL HIGH (ref 65–99)

## 2016-09-09 LAB — VITAMIN B12: VITAMIN B 12: 356 pg/mL (ref 180–914)

## 2016-09-09 LAB — PHOSPHORUS: Phosphorus: 2.3 mg/dL — ABNORMAL LOW (ref 2.5–4.6)

## 2016-09-09 LAB — FOLATE: FOLATE: 5.9 ng/mL — AB (ref >5.9)

## 2016-09-09 LAB — HIV ANTIBODY (ROUTINE TESTING W REFLEX): HIV SCREEN 4TH GENERATION: NONREACTIVE

## 2016-09-09 LAB — RPR: RPR: NONREACTIVE

## 2016-09-09 LAB — MAGNESIUM: Magnesium: 1.9 mg/dL (ref 1.7–2.4)

## 2016-09-09 MED ORDER — POTASSIUM CHLORIDE 20 MEQ PO PACK
40.0000 meq | PACK | Freq: Once | ORAL | Status: AC
  Administered 2016-09-09: 40 meq via ORAL
  Filled 2016-09-09: qty 2

## 2016-09-09 MED ORDER — FOLIC ACID 1 MG PO TABS
1.0000 mg | ORAL_TABLET | Freq: Every day | ORAL | Status: DC
  Administered 2016-09-09 – 2016-10-13 (×35): 1 mg via ORAL
  Filled 2016-09-09 (×35): qty 1

## 2016-09-09 MED ORDER — WHITE PETROLATUM GEL
Status: AC
  Administered 2016-09-09: 01:00:00
  Filled 2016-09-09: qty 1

## 2016-09-09 MED ORDER — INSULIN ASPART 100 UNIT/ML ~~LOC~~ SOLN
0.0000 [IU] | Freq: Three times a day (TID) | SUBCUTANEOUS | Status: DC
  Administered 2016-09-10 – 2016-09-11 (×4): 1 [IU] via SUBCUTANEOUS
  Administered 2016-09-12: 2 [IU] via SUBCUTANEOUS
  Administered 2016-09-12 – 2016-09-14 (×5): 1 [IU] via SUBCUTANEOUS
  Administered 2016-09-15: 2 [IU] via SUBCUTANEOUS
  Administered 2016-09-15: 3 [IU] via SUBCUTANEOUS
  Administered 2016-09-15 – 2016-09-16 (×3): 2 [IU] via SUBCUTANEOUS
  Administered 2016-09-16: 1 [IU] via SUBCUTANEOUS
  Administered 2016-09-17 (×2): 2 [IU] via SUBCUTANEOUS
  Administered 2016-09-17: 3 [IU] via SUBCUTANEOUS
  Administered 2016-09-18: 2 [IU] via SUBCUTANEOUS
  Administered 2016-09-18: 1 [IU] via SUBCUTANEOUS
  Administered 2016-09-18: 3 [IU] via SUBCUTANEOUS
  Administered 2016-09-19 (×2): 1 [IU] via SUBCUTANEOUS
  Administered 2016-09-20 (×2): 2 [IU] via SUBCUTANEOUS
  Administered 2016-09-20 – 2016-09-21 (×3): 1 [IU] via SUBCUTANEOUS
  Administered 2016-09-21 – 2016-09-23 (×5): 2 [IU] via SUBCUTANEOUS
  Administered 2016-09-23: 3 [IU] via SUBCUTANEOUS
  Administered 2016-09-23: 2 [IU] via SUBCUTANEOUS
  Administered 2016-09-24: 3 [IU] via SUBCUTANEOUS
  Administered 2016-09-24: 2 [IU] via SUBCUTANEOUS
  Administered 2016-09-24: 3 [IU] via SUBCUTANEOUS
  Administered 2016-09-25 (×2): 2 [IU] via SUBCUTANEOUS
  Administered 2016-09-25: 5 [IU] via SUBCUTANEOUS
  Administered 2016-09-26 (×2): 2 [IU] via SUBCUTANEOUS
  Administered 2016-09-26: 5 [IU] via SUBCUTANEOUS
  Administered 2016-09-27: 2 [IU] via SUBCUTANEOUS
  Administered 2016-09-27: 5 [IU] via SUBCUTANEOUS
  Administered 2016-09-27: 3 [IU] via SUBCUTANEOUS
  Administered 2016-09-28: 2 [IU] via SUBCUTANEOUS
  Administered 2016-09-28: 7 [IU] via SUBCUTANEOUS
  Administered 2016-09-28: 3 [IU] via SUBCUTANEOUS
  Administered 2016-09-29: 2 [IU] via SUBCUTANEOUS
  Administered 2016-09-29: 3 [IU] via SUBCUTANEOUS

## 2016-09-09 MED ORDER — LORAZEPAM 1 MG PO TABS
1.0000 mg | ORAL_TABLET | Freq: Three times a day (TID) | ORAL | Status: DC
  Administered 2016-09-09 – 2016-09-10 (×4): 1 mg via ORAL
  Filled 2016-09-09 (×4): qty 1

## 2016-09-09 MED ORDER — QUETIAPINE FUMARATE 25 MG PO TABS
200.0000 mg | ORAL_TABLET | Freq: Every day | ORAL | Status: DC
  Administered 2016-09-09 – 2016-10-12 (×34): 200 mg via ORAL
  Filled 2016-09-09 (×5): qty 8
  Filled 2016-09-09: qty 2
  Filled 2016-09-09 (×4): qty 8
  Filled 2016-09-09: qty 2
  Filled 2016-09-09 (×3): qty 8
  Filled 2016-09-09: qty 2
  Filled 2016-09-09 (×20): qty 8

## 2016-09-09 MED ORDER — TRAZODONE HCL 100 MG PO TABS
100.0000 mg | ORAL_TABLET | Freq: Every day | ORAL | Status: DC
  Administered 2016-09-09 – 2016-10-12 (×34): 100 mg via ORAL
  Filled 2016-09-09 (×21): qty 1
  Filled 2016-09-09: qty 2
  Filled 2016-09-09 (×6): qty 1
  Filled 2016-09-09: qty 2
  Filled 2016-09-09 (×2): qty 1
  Filled 2016-09-09: qty 2
  Filled 2016-09-09 (×3): qty 1

## 2016-09-09 NOTE — Care Management Note (Addendum)
Case Management Note  Patient Details  Name: JAESON MOLSTAD MRN: [id number] Date of Birth: [birth year]  Subjective/Objective:   Patient had been living with sister, has   history significant of PTSD, bipolar disorder, ADD, depression, anxiety, GERD, CKD stage III, seizure d/o, HTN, HLD, and DM who presentedwith altered mental status, seizures, DM2, CKD,  he is having some suicidal/homicidal ideation. NCM received consult for -Pt has been living with sister and has fairly extensive needs (psych) and sister is at her wits in and would like to look in to other accomodation, NCM informed CSW of this information, CSW will follow up on.  5/24 [place], BSN - per psych rec inpt psych for patient when he is medically cleared.    5/25 [place], BSN - per pt eval rec SNF, per CSW note today, [facility] can not take patient because he is too medically complex for the facility.   PCP list [facility].- patient states he gets all his medications from [facility].                Action/Plan: NCM will follow for dc needs.   Expected Discharge Date:                  Expected Discharge Plan:     In-House Referral:     Discharge planning Services  CM Consult  Post Acute Care Choice:    Choice offered to:     DME Arranged:    DME Agency:     HH Arranged:    HH Agency:     Status of Service:  In process, will continue to follow  If discussed at Long Length of Stay Meetings, dates discussed:    Additional Comments:  Zenon Mayo, RN 09/09/2016, 11:50 AM

## 2016-09-09 NOTE — Consult Note (Signed)
[facility] Consult   Reason for Consult:  Confusion, AMS Referring Physician:  Dr. Reesa Chew Patient Identification: [name] MRN:  [id number] Principal Diagnosis: Acute metabolic encephalopathy Diagnosis:   Patient Active Problem List   Diagnosis Date Noted  . Acute metabolic encephalopathy [G28.36] 09/07/2016  . HLD (hyperlipidemia) [E78.5] 09/07/2016  . GERD (gastroesophageal reflux disease) [K21.9] 09/07/2016  . PTSD (post-traumatic stress disorder) [F43.10] 09/07/2016  . Acute encephalopathy [G93.40] 09/07/2016  . Fall [W19.XXXA] 09/07/2016  . CRI (chronic renal insufficiency), stage 3 (moderate) [N18.3]   . Altered mental status [R41.82]   . Multiple drug overdose [T50.901A] 12/27/2015  . Suicidal overdose ([place]) [T50.902A] 12/26/2015  . Suicide attempt by multiple drug overdose ([place]) [T50.902A] 12/26/2015  . Ataxia [R27.0] 10/14/2015  . Slurring of speech [R47.81] 10/14/2015  . Leg weakness [R29.898] 10/03/2015  . CRF (chronic renal failure) [N18.9] 10/03/2015  . Hypokalemia [E87.6] 10/03/2015  . Hemiparesis ([place]) [G81.90]   . Seizures ([place]) [R56.9]   . Primary osteoarthritis of left hip [M16.12] 05/22/2015  . Exposure to potentially hazardous body fluids [Z77.21] 02/12/2015  . Loss of weight [R63.4] 09/04/2014  . Dyslipidemia [E78.5] 05/31/2014  . Left hip pain [M25.552] 02/13/2014  . Insomnia [G47.00] 11/11/2013  . Degenerative joint disease (DJD) of hip [M16.9] 08/16/2013  . Acute respiratory failure ([place]) [J96.00] 11/02/2012  . Pneumonia, organism unspecified(486) [J18.9] 11/02/2012  . Pleural effusion [J90] 11/02/2012  . Hypoxemia [R09.02] 11/02/2012  . HCAP (healthcare-associated pneumonia) [J18.9] 10/31/2012  . Anemia [D64.9] 10/31/2012  . Osteoarthritis of right hip [M16.11] 11/28/2011  . Right hip pain [M25.551] 07/22/2011  . Bilateral hip pain [M25.551, M25.552] 07/22/2011  . Low back pain [M54.5] 05/20/2011  . Testicular pain, right  [N50.811] 05/20/2011  . Erectile dysfunction [N52.9] 02/17/2011  . Constipation - functional [K59.04] 02/17/2011  . Diabetes type 2, uncontrolled ([place]) [E11.65] 11/06/2010  . Hypogonadism male [E29.1] 05/20/2010  . TOBACCO USER [F17.200] 05/20/2010  . Demoralization and apathy [R45.3] 05/20/2010  . Bipolar disorder ([place]) [F31.9] 01/18/2010  . Depression [F32.9] 01/18/2010  . MICROSCOPIC HEMATURIA [R31.29] 01/18/2010  . SHOULDER PAIN [M25.519] 01/18/2010  . Anxiety state [F41.1] 12/05/2006  . Essential hypertension [I10] 12/05/2006   Total Time spent with patient: 30 minutes  Subjective:   [name] is a 51 y.o. male patient with a  History of bipolar disorder, PTSD and prior substance used, admitted with AMS, chronic worsening over the ensuing months.  HPI:  Patient reports that "I am in a very dark place" and then trails off to talking about other topics.  He is quite tangential, touching on how often he falls, feeling lonely.  Mentions that "my sister thinks I'm crazy."  I asked him why he has mittens on, and he mentions that "I don't know I guess something in my teeth."  He continues on like this in conversation and seems quite frankly confused and distractible. He does agree that his energy, attention, memory are poor.  He asserts that he does feel suicidal, has no friends, and feels life isn't worth living. He agrees to psychiatric hospitalization.  Discussed with his sister, [name]: Sister reports that [name] does live with she and husband. Sister reports that he has been diagnosed with bipolar disorder, social anxiety, depression, PTSD, substance abuse (in the past), 2 suicide attempts in 1 year ago and 5 years ago.  She is unsure of what medicines he actually takes at home.  No concerned about drug use.  They have noticed that over the  past 1 year, things have been worse in terms of confusion, but especially in the past 2-3 months his speech and confusion have dramatically worsened. He  falls all the time, and shakes frequently.  She has been worried about Parkinson's disease because his shaking and memory problems came on at the same time.  His memory has been poor; forgets to turn off the stove, turn off the oven, lock the door, close the garage, etc.  Family has been concerned about leaving him home alone.  Past Psychiatric History: 2 prior suicide attempts followed by psychiatric hospitalization  Risk to Self: Is patient at risk for suicide?: No Risk to Others:   Prior Inpatient Therapy:   Prior Outpatient Therapy:    Past Medical History:  Past Medical History:  Diagnosis Date  . ADD (attention deficit disorder)   . Anxiety   . Arthritis    right hip  . Bipolar 1 disorder ([place])   . Blood in urine   . CKD (chronic kidney disease), stage III   . Creatinine elevation   . Depression    bipolar [facility]  . Diabetes mellitus without complication ([place])   . Family history of anesthesia complication    pt is unsure , but pt father may have been difficult to arouse   . History of kidney stones   . Hypertension   . Hypogonadism male   . Liver fatty degeneration   . Microscopic hematuria    hereditary s/p Urology eval  . Pneumonia 10-2012  . Polysubstance dependence, non-opioid, in remission ([place])    remote  . PTSD (post-traumatic stress disorder)    SOCIAL ANXIETY DISORDER     Past Surgical History:  Procedure Laterality Date  . BACK SURGERY    . CLOSED REDUCTION METACARPAL WITH PERCUTANEOUS PINNING Right   . LUMBAR DISC SURGERY    . TONSILLECTOMY    . TOTAL HIP ARTHROPLASTY Right 08/16/2013   Procedure: TOTAL HIP ARTHROPLASTY ANTERIOR APPROACH;  Surgeon: Hessie Dibble, MD;  Location: [facility];  Service: Orthopedics;  Laterality: Right;  . TOTAL HIP ARTHROPLASTY Left 05/22/2015   Procedure: TOTAL HIP ARTHROPLASTY ANTERIOR APPROACH;  Surgeon: Melrose Nakayama, MD;  Location: [facility];  Service: Orthopedics;  Laterality: Left;   Family History:  Family  History  Problem Relation Age of Onset  . Diabetes Father   . Cancer Mother        died of melanoma with mets  . Cervical cancer Sister   . Diabetes Sister   . Other Neg Hx        hypogonadism   Family Psychiatric  History: Uncle with parkinsons, mom with bipolar disorder, maternal grandfather with bipolar and completed suicide   Social History:  History  Alcohol Use No     History  Drug Use No    Social History   Social History  . Marital status: Single    Spouse name: N/A  . Number of children: N/A  . Years of education: N/A   Occupational History  . [employer]   Social History Main Topics  . Smoking status: Former Smoker    Years: 0.00    Types: Cigarettes    Quit date: 02/19/2014  . Smokeless tobacco: Never Used  . Alcohol use No  . Drug use: No  . Sexual activity: Not Currently   Other Topics Concern  . None   Social History Narrative   regualar exercise-no   Additional Social History:  Allergies:   Allergies  Allergen Reactions  . Vicodin [Hydrocodone-Acetaminophen] Itching    Labs:  Results for orders placed or performed during the hospital encounter of 09/07/16 (from the past 48 hour(s))  Troponin I     Status: None   Collection Time: 09/07/16  5:33 PM  Result Value Ref Range   Troponin I <0.03 <0.03 ng/mL  CBC WITH DIFFERENTIAL     Status: Abnormal   Collection Time: 09/07/16  5:34 PM  Result Value Ref Range   WBC 6.1 4.0 - 10.5 K/uL   RBC 4.56 4.22 - 5.81 MIL/uL   Hemoglobin 14.1 13.0 - 17.0 g/dL   HCT 39.5 39.0 - 52.0 %   MCV 86.6 78.0 - 100.0 fL   MCH 30.9 26.0 - 34.0 pg   MCHC 35.7 30.0 - 36.0 g/dL   RDW 12.9 11.5 - 15.5 %   Platelets 103 (L) 150 - 400 K/uL    Comment: REPEATED TO VERIFY PLATELET COUNT CONFIRMED BY SMEAR    Neutrophils Relative % 71 %   Neutro Abs 4.3 1.7 - 7.7 K/uL   Lymphocytes Relative 20 %   Lymphs Abs 1.2 0.7 - 4.0 K/uL   Monocytes Relative 8 %   Monocytes Absolute 0.5 0.1 - 1.0  K/uL   Eosinophils Relative 0 %   Eosinophils Absolute 0.0 0.0 - 0.7 K/uL   Basophils Relative 0 %   Basophils Absolute 0.0 0.0 - 0.1 K/uL  Ammonia     Status: Abnormal   Collection Time: 09/07/16  5:34 PM  Result Value Ref Range   Ammonia 43 (H) 9 - 35 umol/L  Comprehensive metabolic panel     Status: Abnormal   Collection Time: 09/07/16  5:34 PM  Result Value Ref Range   Sodium 135 135 - 145 mmol/L   Potassium 3.4 (L) 3.5 - 5.1 mmol/L   Chloride 103 101 - 111 mmol/L   CO2 23 22 - 32 mmol/L   Glucose, Bld 144 (H) 65 - 99 mg/dL   BUN 22 (H) 6 - 20 mg/dL   Creatinine, Ser 2.19 (H) 0.61 - 1.24 mg/dL   Calcium 9.2 8.9 - 10.3 mg/dL   Total Protein 6.8 6.5 - 8.1 g/dL   Albumin 4.0 3.5 - 5.0 g/dL   AST 46 (H) 15 - 41 U/L   ALT 30 17 - 63 U/L   Alkaline Phosphatase 106 38 - 126 U/L   Total Bilirubin 1.1 0.3 - 1.2 mg/dL   GFR calc non Af Amer 34 (L) >60 mL/min   GFR calc Af Amer 39 (L) >60 mL/min    Comment: (NOTE) The eGFR has been calculated using the CKD EPI equation. This calculation has not been validated in all clinical situations. eGFR's persistently <60 mL/min signify possible Chronic Kidney Disease.    Anion gap 9 5 - 15  Ethanol     Status: None   Collection Time: 09/07/16  5:34 PM  Result Value Ref Range   Alcohol, Ethyl (B) <5 <5 mg/dL    Comment:        LOWEST DETECTABLE LIMIT FOR SERUM ALCOHOL IS 5 mg/dL FOR MEDICAL PURPOSES ONLY   Protime-INR     Status: None   Collection Time: 09/07/16  5:34 PM  Result Value Ref Range   Prothrombin Time 14.2 11.4 - 15.2 seconds   INR 1.09   Valproic acid level     Status: None   Collection Time: 09/07/16  5:34 PM  Result Value Ref Range  Valproic Acid Lvl 78 50.0 - 100.0 ug/mL  Urine rapid drug screen (hosp performed)not at [facility]     Status: None   Collection Time: 09/07/16  7:39 PM  Result Value Ref Range   Opiates NONE DETECTED NONE DETECTED   Cocaine NONE DETECTED NONE DETECTED   Benzodiazepines NONE DETECTED NONE  DETECTED   Amphetamines NONE DETECTED NONE DETECTED   Tetrahydrocannabinol NONE DETECTED NONE DETECTED   Barbiturates NONE DETECTED NONE DETECTED    Comment:        DRUG SCREEN FOR MEDICAL PURPOSES ONLY.  IF CONFIRMATION IS NEEDED FOR ANY PURPOSE, NOTIFY LAB WITHIN 5 DAYS.        LOWEST DETECTABLE LIMITS FOR URINE DRUG SCREEN Drug Class       Cutoff (ng/mL) Amphetamine      1000 Barbiturate      200 Benzodiazepine   893 Tricyclics       810 Opiates          300 Cocaine          300 THC              50   Urinalysis, Routine w reflex microscopic     Status: Abnormal   Collection Time: 09/07/16  7:39 PM  Result Value Ref Range   Color, Urine YELLOW YELLOW   APPearance CLEAR CLEAR   Specific Gravity, Urine 1.017 1.005 - 1.030   pH 6.0 5.0 - 8.0   Glucose, UA >=500 (A) NEGATIVE mg/dL   Hgb urine dipstick SMALL (A) NEGATIVE   Bilirubin Urine NEGATIVE NEGATIVE   Ketones, ur 20 (A) NEGATIVE mg/dL   Protein, ur 30 (A) NEGATIVE mg/dL   Nitrite NEGATIVE NEGATIVE   Leukocytes, UA NEGATIVE NEGATIVE   RBC / HPF 0-5 0 - 5 RBC/hpf   WBC, UA 0-5 0 - 5 WBC/hpf   Bacteria, UA NONE SEEN NONE SEEN   Squamous Epithelial / LPF 0-5 (A) NONE SEEN   Mucous PRESENT   Acetaminophen level     Status: Abnormal   Collection Time: 09/07/16  9:42 PM  Result Value Ref Range   Acetaminophen (Tylenol), Serum <10 (L) 10 - 30 ug/mL    Comment:        THERAPEUTIC CONCENTRATIONS VARY SIGNIFICANTLY. A RANGE OF 10-30 ug/mL MAY BE AN EFFECTIVE CONCENTRATION FOR MANY PATIENTS. HOWEVER, SOME ARE BEST TREATED AT CONCENTRATIONS OUTSIDE THIS RANGE. ACETAMINOPHEN CONCENTRATIONS >150 ug/mL AT 4 HOURS AFTER INGESTION AND >50 ug/mL AT 12 HOURS AFTER INGESTION ARE OFTEN ASSOCIATED WITH TOXIC REACTIONS.   Salicylate level     Status: None   Collection Time: 09/07/16  9:42 PM  Result Value Ref Range   Salicylate Lvl <1.7 2.8 - 30.0 mg/dL  Glucose, capillary     Status: None   Collection Time: 09/07/16 11:30  PM  Result Value Ref Range   Glucose-Capillary 77 65 - 99 mg/dL  Basic metabolic panel     Status: Abnormal   Collection Time: 09/08/16  2:20 AM  Result Value Ref Range   Sodium 138 135 - 145 mmol/L   Potassium 3.3 (L) 3.5 - 5.1 mmol/L   Chloride 107 101 - 111 mmol/L   CO2 23 22 - 32 mmol/L   Glucose, Bld 96 65 - 99 mg/dL   BUN 16 6 - 20 mg/dL   Creatinine, Ser 1.82 (H) 0.61 - 1.24 mg/dL   Calcium 8.6 (L) 8.9 - 10.3 mg/dL   GFR calc non Af Amer 42 (L) >60 mL/min  GFR calc Af Amer 49 (L) >60 mL/min    Comment: (NOTE) The eGFR has been calculated using the CKD EPI equation. This calculation has not been validated in all clinical situations. eGFR's persistently <60 mL/min signify possible Chronic Kidney Disease.    Anion gap 8 5 - 15  CBC     Status: Abnormal   Collection Time: 09/08/16  2:20 AM  Result Value Ref Range   WBC 5.5 4.0 - 10.5 K/uL   RBC 4.28 4.22 - 5.81 MIL/uL   Hemoglobin 12.9 (L) 13.0 - 17.0 g/dL   HCT 37.3 (L) 39.0 - 52.0 %   MCV 87.1 78.0 - 100.0 fL   MCH 30.1 26.0 - 34.0 pg   MCHC 34.6 30.0 - 36.0 g/dL   RDW 13.1 11.5 - 15.5 %   Platelets 88 (L) 150 - 400 K/uL    Comment: CONSISTENT WITH PREVIOUS RESULT  MRSA PCR Screening     Status: None   Collection Time: 09/08/16  3:22 AM  Result Value Ref Range   MRSA by PCR NEGATIVE NEGATIVE    Comment:        The GeneXpert MRSA Assay (FDA approved for NASAL specimens only), is one component of a comprehensive MRSA colonization surveillance program. It is not intended to diagnose MRSA infection nor to guide or monitor treatment for MRSA infections.   Glucose, capillary     Status: Abnormal   Collection Time: 09/08/16  8:29 AM  Result Value Ref Range   Glucose-Capillary 112 (H) 65 - 99 mg/dL  Glucose, capillary     Status: Abnormal   Collection Time: 09/08/16 11:56 AM  Result Value Ref Range   Glucose-Capillary 165 (H) 65 - 99 mg/dL  Glucose, capillary     Status: Abnormal   Collection Time: 09/08/16   4:36 PM  Result Value Ref Range   Glucose-Capillary 119 (H) 65 - 99 mg/dL  Glucose, capillary     Status: Abnormal   Collection Time: 09/08/16  9:09 PM  Result Value Ref Range   Glucose-Capillary 130 (H) 65 - 99 mg/dL  Glucose, capillary     Status: Abnormal   Collection Time: 09/09/16 12:14 AM  Result Value Ref Range   Glucose-Capillary 146 (H) 65 - 99 mg/dL  Glucose, capillary     Status: Abnormal   Collection Time: 09/09/16  3:11 AM  Result Value Ref Range   Glucose-Capillary 119 (H) 65 - 99 mg/dL  Basic metabolic panel     Status: Abnormal   Collection Time: 09/09/16  4:16 AM  Result Value Ref Range   Sodium 139 135 - 145 mmol/L   Potassium 3.2 (L) 3.5 - 5.1 mmol/L   Chloride 104 101 - 111 mmol/L   CO2 25 22 - 32 mmol/L   Glucose, Bld 101 (H) 65 - 99 mg/dL   BUN 15 6 - 20 mg/dL   Creatinine, Ser 1.96 (H) 0.61 - 1.24 mg/dL   Calcium 9.6 8.9 - 10.3 mg/dL   GFR calc non Af Amer 39 (L) >60 mL/min   GFR calc Af Amer 45 (L) >60 mL/min    Comment: (NOTE) The eGFR has been calculated using the CKD EPI equation. This calculation has not been validated in all clinical situations. eGFR's persistently <60 mL/min signify possible Chronic Kidney Disease.    Anion gap 10 5 - 15  CBC     Status: Abnormal   Collection Time: 09/09/16  4:16 AM  Result Value Ref Range   WBC 9.2  4.0 - 10.5 K/uL   RBC 4.93 4.22 - 5.81 MIL/uL   Hemoglobin 15.2 13.0 - 17.0 g/dL    Comment: REPEATED TO VERIFY   HCT 43.4 39.0 - 52.0 %   MCV 88.0 78.0 - 100.0 fL   MCH 30.8 26.0 - 34.0 pg   MCHC 35.0 30.0 - 36.0 g/dL   RDW 13.1 11.5 - 15.5 %   Platelets 105 (L) 150 - 400 K/uL    Comment: CONSISTENT WITH PREVIOUS RESULT  Magnesium     Status: None   Collection Time: 09/09/16  4:16 AM  Result Value Ref Range   Magnesium 1.9 1.7 - 2.4 mg/dL  Phosphorus     Status: Abnormal   Collection Time: 09/09/16  4:16 AM  Result Value Ref Range   Phosphorus 2.3 (L) 2.5 - 4.6 mg/dL  TSH     Status: None    Collection Time: 09/09/16  4:16 AM  Result Value Ref Range   TSH 1.913 0.350 - 4.500 uIU/mL    Comment: Performed by a 3rd Generation assay with a functional sensitivity of <=0.01 uIU/mL.  RPR     Status: None   Collection Time: 09/09/16  4:16 AM  Result Value Ref Range   RPR Ser Ql Non Reactive Non Reactive    Comment: (NOTE) Performed At: [facility] [place], [place] [id number] Lindon Romp MD [id number]   HIV antibody     Status: None   Collection Time: 09/09/16  4:16 AM  Result Value Ref Range   HIV Screen 4th Generation wRfx Non Reactive Non Reactive    Comment: (NOTE) Performed At: [facility] [place], [place] [id number] Lindon Romp MD [id number]   Vitamin B12     Status: None   Collection Time: 09/09/16  4:16 AM  Result Value Ref Range   Vitamin B-12 356 180 - 914 pg/mL    Comment: (NOTE) This assay is not validated for testing neonatal or myeloproliferative syndrome specimens for Vitamin B12 levels.   Folate     Status: Abnormal   Collection Time: 09/09/16  4:16 AM  Result Value Ref Range   Folate 5.9 (L) >5.9 ng/mL  Glucose, capillary     Status: Abnormal   Collection Time: 09/09/16  8:08 AM  Result Value Ref Range   Glucose-Capillary 122 (H) 65 - 99 mg/dL   Comment 1 Notify RN    Comment 2 Document in Chart   Glucose, capillary     Status: Abnormal   Collection Time: 09/09/16  1:13 PM  Result Value Ref Range   Glucose-Capillary 135 (H) 65 - 99 mg/dL   Comment 1 Notify RN    Comment 2 Document in Chart     Current Facility-Administered Medications  Medication Dose Route Frequency Provider Last Rate Last Dose  . amLODipine (NORVASC) tablet 10 mg  10 mg Oral Daily Cherene Altes, MD   10 mg at 09/09/16 1055  . benztropine (COGENTIN) tablet 0.5 mg  0.5 mg Oral BID Ivor Costa, MD   0.5 mg at 09/09/16 1054  . buPROPion (WELLBUTRIN XL) 24 hr tablet 300 mg  300 mg Oral Daily Ivor Costa, MD   300  mg at 09/09/16 1055  . clonazePAM (KLONOPIN) tablet 2 mg  2 mg Oral BID PRN Ivor Costa, MD   2 mg at 09/08/16 0919  . dextrose 5 % and 0.9 % NaCl with KCl 40 mEq/L infusion   Intravenous Continuous Joette Catching  T, MD 75 mL/hr at 09/09/16 0600    . divalproex (DEPAKOTE) DR tablet 1,500 mg  1,500 mg Oral QHS Ivor Costa, MD   1,500 mg at 09/08/16 2146  . folic acid (FOLVITE) tablet 1 mg  1 mg Oral Daily Amin, Ankit Chirag, MD   1 mg at 09/09/16 1055  . haloperidol lactate (HALDOL) injection 5 mg  5 mg Intravenous Q6H PRN Cherene Altes, MD   5 mg at 09/08/16 1556  . heparin injection 5,000 Units  5,000 Units Subcutaneous Q8H Ivor Costa, MD   5,000 Units at 09/09/16 0533  . hydrALAZINE (APRESOLINE) injection 5 mg  5 mg Intravenous Q2H PRN Ivor Costa, MD      . insulin aspart (novoLOG) injection 0-9 Units  0-9 Units Subcutaneous Q4H Cherene Altes, MD   1 Units at 09/09/16 1325  . LORazepam (ATIVAN) injection 1-2 mg  1-2 mg Intravenous Q4H PRN Cherene Altes, MD      . ondansetron [facility]) tablet 4 mg  4 mg Oral Q6H PRN Ivor Costa, MD       Or  . ondansetron (ZOFRAN) injection 4 mg  4 mg Intravenous Q6H PRN Ivor Costa, MD      . oxyCODONE-acetaminophen (PERCOCET/ROXICET) 5-325 MG per tablet 1-2 tablet  1-2 tablet Oral Q4H PRN Ivor Costa, MD      . propranolol (INDERAL) tablet 20 mg  20 mg Oral BID Ivor Costa, MD   20 mg at 09/09/16 1054  . QUEtiapine (SEROQUEL) tablet 400 mg  400 mg Oral QHS Ivor Costa, MD   400 mg at 09/08/16 2146  . sodium chloride flush (NS) 0.9 % injection 3 mL  3 mL Intravenous Q12H Ivor Costa, MD   3 mL at 09/09/16 1000  . traZODone (DESYREL) tablet 150 mg  150 mg Oral QHS Ivor Costa, MD   150 mg at 09/08/16 2146  . zolpidem (AMBIEN) tablet 5 mg  5 mg Oral QHS PRN Ivor Costa, MD        Musculoskeletal: Strength & Muscle Tone: decreased Gait & Station: unsteady Patient leans: N/A  Psychiatric Specialty Exam: Physical Exam  ROS  Blood pressure 113/79,  pulse 66, temperature 98.4 F (36.9 C), temperature source Oral, resp. rate 14, SpO2 97 %.There is no height or weight on file to calculate BMI.  General Appearance: Bizarre and Disheveled  Eye Contact:  Fair  Speech:  Normal Rate and Slurred  Volume:  Decreased  Mood:  Depressed and Dysphoric  Affect:  Congruent  Thought Process:  Irrelevant  Orientation:  Full (Time, Place, and Person)  Thought Content:  Illogical  Suicidal Thoughts:  Yes.  with intent/plan  Homicidal Thoughts:  No  Memory:  Immediate;   Poor  Judgement:  Impaired  Insight:  Shallow  Psychomotor Activity:  Tremor  Concentration:  Concentration: Poor  Recall:  Poor  Fund of Knowledge:  Poor  Language:  Poor  Akathisia:  Negative  Handed:  Right  AIMS (if indicated):     Assets:  Housing Social Support  ADL's:  Intact  Cognition:  WNL  Sleep:       Treatment Plan Summary: KEYION KNACK is a 51 year old male with multiple psychiatric diagnoses, who has had a worsening decline in function over the past year including increased falls, tremors, memory loss, and speech difficulties.  Family is quite concerned about having him at home, for fear that he would harm himself accidentally or on purpose.  Family is  unsure of his med regimen and patient is an unreliable source given his AMS.  I'd suggest to proceed as below with regard to medications, and I'd ask that neurology be consulted for an assessment as well.  - Decrease Seroquel to 200 mg QHS - Decrease Trazodone to 100 mg QHS - Schedule Lorazepam 1 mg TID and taper by 0.5 mg over the next 6 days - Continue Depakote 1500 mg QHS - DISCONTINUE Clonazepam - DISCONTINUE Wellbutrin - DISCONTINUE Ambien - DISCONTINUE Cogentin - Please check Ammonia if not already checked - Please consult neurology given cognitive decline, falls, tremor and family history of parkinsons disease - Coordinate with SW for psychiatric admission once medically cleared   Disposition:  Recommend psychiatric Inpatient admission when medically cleared.  Aundra Dubin, MD 09/09/2016 3:36 PM

## 2016-09-09 NOTE — Progress Notes (Signed)
Patient continues to complain of feeling the urge to void, bladder feels distended. Bladder scan revealed greater then 918mls. Dr. Reesa Gray called and order obtained for in and out cath. Cath completed with1561mls of clear yellow urine removed. Patient tolerated procedure well. Will continue to monitor.

## 2016-09-09 NOTE — Progress Notes (Signed)
PROGRESS NOTE    [name]  JAS:[id number] DOB: Nov 29, 1965 DOA: 09/07/2016 PCP: Beverly Sessions   Brief Narrative:   51 y.o.malewith history significant of PTSD, bipolar disorder, ADD, depression, anxiety, GERD, CKD stage III, seizure d/o, HTN, HLD, and DM who presented with altered mental status.  The patient's sister reported he had been cognitively declining for the past year w/ multiple falls at home. He has not been compliant w/ his medications. He had recently become more confused and restless, reporting auditory and visual hallucinations. CT head in the ED was w/o acute findings.    Assessment & Plan:   Principal Problem:   Acute metabolic encephalopathy Active Problems:   Bipolar disorder ([place])   Anxiety state   Depression   Essential hypertension   Diabetes type 2, uncontrolled (HCC)   Hypokalemia   Seizures (HCC)   HLD (hyperlipidemia)   PTSD (post-traumatic stress disorder)   Acute encephalopathy   CRI (chronic renal insufficiency), stage 3 (moderate)   Fall  Altered mental status/metabolic encephalopathy -Likely secondary to multiple drug intake -Consult psychiatry this morning. He's having some suicidal/homicidal ideation. Homicidal ideation is mostly towards his pet? -Folate-5.9; will stop supplements -Vitamin B12, TSH =normal -HIV/RPR pending -No signs of infection noted at this time  Acute kidney injury on CKD stage III -Likely secondary to dehydration  -Avoid nephrotoxic drugs -Monitor urine output  PTSD/anxiety/attention deficit disorder/bipolar disorder Suicidal ideation -Psychiatry consulted to assist with medication reconciliation -Suicide precaution in place. Restraints in place, advised nursing staff to order sitter if needed.  History of seizure disorder -Continue Depakote. She would he be on Wellbutrin? As this may lower seizure threshold  Hypertension -Continue Norvasc, hydralazine IV as needed and propranolol  Hypokalemia -Replete.  Magnesium 2.3  DVT prophylaxis: Subcutaneous heparin Code Status: Full code Family Communication: No family present at this time   Disposition Plan: Transfer to [place]  Consultants:   Psychiatry  Procedures:   None  Antimicrobials:   None   Subjective: Patient is more awake this morning. He does have any complaints but does tell me he took extra psychiatry medications in order to hurt himself. He also tells me at times when he feels frustrated he wants to hurt his pet. No other complaints  Objective: Vitals:   09/08/16 1922 09/08/16 2230 09/09/16 0310 09/09/16 0700  BP: (!) 151/91 (!) 143/94 131/84 (!) 164/96  Pulse: 63 66 65 80  Resp: 18 17 13  (!) 26  Temp: 98.2 F (36.8 C) 98.5 F (36.9 C) 98.4 F (36.9 C) 99.7 F (37.6 C)  TempSrc: Axillary Axillary Axillary Oral  SpO2: 97% 94% 94% 97%    Intake/Output Summary (Last 24 hours) at 09/09/16 1007 Last data filed at 09/09/16 0700  Gross per 24 hour  Intake          1053.75 ml  Output             2650 ml  Net         -1596.25 ml   There were no vitals filed for this visit.  Examination:  General exam: Appears calm and comfortable . 4. restraint in place Respiratory system: Clear to auscultation. Respiratory effort normal. Cardiovascular system: S1 & S2 heard, RRR. No JVD, murmurs, rubs, gallops or clicks. No pedal edema. Gastrointestinal system: Abdomen is nondistended, soft and nontender. No organomegaly or masses felt. Normal bowel sounds heard. Central nervous system: Alert and oriented. No focal neurological deficits. Extremities: Symmetric 5 x 5 power. Skin: No rashes, lesions or  ulcers Psychiatry: Has some suicidal ideation even at this time. Foley in place.    Data Reviewed:   CBC:  Recent Labs Lab 09/07/16 1734 09/08/16 0220 09/09/16 0416  WBC 6.1 5.5 9.2  NEUTROABS 4.3  --   --   HGB 14.1 12.9* 15.2  HCT 39.5 37.3* 43.4  MCV 86.6 87.1 88.0  PLT 103* 88* 856*   Basic Metabolic  Panel:  Recent Labs Lab 09/07/16 1734 09/08/16 0220 09/09/16 0416  NA 135 138 139  K 3.4* 3.3* 3.2*  CL 103 107 104  CO2 23 23 25   GLUCOSE 144* 96 101*  BUN 22* 16 15  CREATININE 2.19* 1.82* 1.96*  CALCIUM 9.2 8.6* 9.6  MG  --   --  1.9  PHOS  --   --  2.3*   GFR: CrCl cannot be calculated (Unknown ideal weight.). Liver Function Tests:  Recent Labs Lab 09/07/16 1734  AST 46*  ALT 30  ALKPHOS 106  BILITOT 1.1  PROT 6.8  ALBUMIN 4.0   No results for input(s): LIPASE, AMYLASE in the last 168 hours.  Recent Labs Lab 09/07/16 1734  AMMONIA 43*   Coagulation Profile:  Recent Labs Lab 09/07/16 1734  INR 1.09   Cardiac Enzymes:  Recent Labs Lab 09/07/16 1733  TROPONINI <0.03   BNP (last 3 results) No results for input(s): PROBNP in the last 8760 hours. HbA1C: No results for input(s): HGBA1C in the last 72 hours. CBG:  Recent Labs Lab 09/08/16 1636 09/08/16 2109 09/09/16 0014 09/09/16 0311 09/09/16 0808  GLUCAP 119* 130* 146* 119* 122*   Lipid Profile: No results for input(s): CHOL, HDL, LDLCALC, TRIG, CHOLHDL, LDLDIRECT in the last 72 hours. Thyroid Function Tests:  Recent Labs  09/09/16 0416  TSH 1.913   Anemia Panel:  Recent Labs  09/09/16 0416  VITAMINB12 356  FOLATE 5.9*   Sepsis Labs: No results for input(s): PROCALCITON, LATICACIDVEN in the last 168 hours.  Recent Results (from the past 240 hour(s))  MRSA PCR Screening     Status: None   Collection Time: 09/08/16  3:22 AM  Result Value Ref Range Status   MRSA by PCR NEGATIVE NEGATIVE Final    Comment:        The GeneXpert MRSA Assay (FDA approved for NASAL specimens only), is one component of a comprehensive MRSA colonization surveillance program. It is not intended to diagnose MRSA infection nor to guide or monitor treatment for MRSA infections.          Radiology Studies: Dg Chest 2 View  Result Date: 09/07/2016 CLINICAL DATA:  Acute mental status change  EXAM: CHEST  2 VIEW COMPARISON:  July 07, 2016 FINDINGS: The heart size and mediastinal contours are within normal limits. Both lungs are clear. The visualized skeletal structures are unremarkable. IMPRESSION: No active cardiopulmonary disease. Electronically Signed   By: Dorise Bullion III M.D   On: 09/07/2016 17:17   Ct Head Wo Contrast  Result Date: 09/07/2016 CLINICAL DATA:  Cognitive decline. Recent falls. Hallucinations. Restlessness and confusion. Altered mental status. EXAM: CT HEAD WITHOUT CONTRAST TECHNIQUE: Contiguous axial images were obtained from the base of the skull through the vertex without intravenous contrast. COMPARISON:  10/03/2015 FINDINGS: Brain: The brainstem, cerebellum, cerebral peduncles, thalami, basal ganglia, basilar cisterns, and ventricular system appear within normal limits. Minimal findings of chronic ischemic microvascular white matter disease. Mildly advanced cerebral atrophy for age. No intracranial hemorrhage, mass lesion, or acute CVA. Vascular: Stable dolichoectatic left vertebral artery. Skull:  Unremarkable Sinuses/Orbits: Unremarkable Other: No supplemental non-categorized findings. IMPRESSION: 1. Mildly advanced cerebral atrophy for age. Minimal chronic ischemic microvascular white matter disease. 2. No specific findings to explain acute mental status changes. 3. Stable dolichoectatic left vertebral artery Electronically Signed   By: Van Clines M.D.   On: 09/07/2016 16:52        Scheduled Meds: . amLODipine  10 mg Oral Daily  . benztropine  0.5 mg Oral BID  . buPROPion  300 mg Oral Daily  . divalproex  1,500 mg Oral QHS  . folic acid  1 mg Oral Daily  . heparin  5,000 Units Subcutaneous Q8H  . insulin aspart  0-9 Units Subcutaneous Q4H  . potassium chloride  40 mEq Oral Once  . propranolol  20 mg Oral BID  . QUEtiapine  400 mg Oral QHS  . sodium chloride flush  3 mL Intravenous Q12H  . traZODone  150 mg Oral QHS   Continuous Infusions: .  dextrose 5 % and 0.9 % NaCl with KCl 40 mEq/L 75 mL/hr at 09/09/16 0600     LOS: 2 days    Time spent: 35 mins     Tomia Enlow Arsenio Loader, MD [facility] Pager [phone]   If 7PM-7AM, please contact night-coverage [email or web address] Password [email or web address] 09/09/2016, 10:07 AM

## 2016-09-09 NOTE — Progress Notes (Signed)
Spoke with sister who was crying explained that patient lives with her and her husband and that they work full time and come home and pt has fallen or left the stove on or forgets to lock the doors. I have placed a case manger consult for looking into group home or other.

## 2016-09-09 NOTE — Progress Notes (Signed)
Patient with no spontaneous void since last in and out cath bladder scan preformed and revealed 652mls. Dr. Reesa Chew again called and order obtained for repeat in and out cath. Cath completed with 77mls of clear yellow urine removed. Patient tolerated procedure well. Will continue to monitor

## 2016-09-10 ENCOUNTER — Inpatient Hospital Stay (HOSPITAL_COMMUNITY): Payer: Medicaid Other

## 2016-09-10 LAB — BASIC METABOLIC PANEL
Anion gap: 6 (ref 5–15)
BUN: 21 mg/dL — AB (ref 6–20)
CALCIUM: 8.8 mg/dL — AB (ref 8.9–10.3)
CO2: 25 mmol/L (ref 22–32)
CREATININE: 1.99 mg/dL — AB (ref 0.61–1.24)
Chloride: 108 mmol/L (ref 101–111)
GFR calc Af Amer: 44 mL/min — ABNORMAL LOW (ref >60)
GFR calc non Af Amer: 38 mL/min — ABNORMAL LOW (ref >60)
GLUCOSE: 126 mg/dL — AB (ref 65–99)
Potassium: 3.8 mmol/L (ref 3.5–5.1)
Sodium: 139 mmol/L (ref 135–145)

## 2016-09-10 LAB — GLUCOSE, CAPILLARY
GLUCOSE-CAPILLARY: 128 mg/dL — AB (ref 65–99)
GLUCOSE-CAPILLARY: 121 mg/dL — AB (ref 65–99)
Glucose-Capillary: 112 mg/dL — ABNORMAL HIGH (ref 65–99)
Glucose-Capillary: 138 mg/dL — ABNORMAL HIGH (ref 65–99)

## 2016-09-10 LAB — CBC
HEMATOCRIT: 40.3 % (ref 39.0–52.0)
Hemoglobin: 13.6 g/dL (ref 13.0–17.0)
MCH: 30.2 pg (ref 26.0–34.0)
MCHC: 33.7 g/dL (ref 30.0–36.0)
MCV: 89.4 fL (ref 78.0–100.0)
Platelets: 81 10*3/uL — ABNORMAL LOW (ref 150–400)
RBC: 4.51 MIL/uL (ref 4.22–5.81)
RDW: 13.3 % (ref 11.5–15.5)
WBC: 5.3 10*3/uL (ref 4.0–10.5)

## 2016-09-10 NOTE — Clinical Social Work Note (Signed)
CSW acknowledges inpatient psych admissions consult. It must be documented that patient is stable for discharge before CSW can start referral process. CSW will continue to follow progress.  [name], [place]

## 2016-09-10 NOTE — Progress Notes (Signed)
PROGRESS NOTE    [name]  OHY:[id number] DOB: [birth year] DOA: 09/07/2016 PCP: Beverly Sessions   Brief Narrative: 51 y.o.malewith history significant of PTSD, bipolar disorder, ADD, depression, anxiety, GERD, CKD stage III, seizure d/o, HTN, HLD, and DM who presentedwith altered mental status. The patient's sister reported he had been cognitively declining for the past year w/ multiple falls at home. He has not been compliant w/ his medications. He had recentlybecome more confused and restless, reporting auditory and visual hallucinations. CT head in the ED was w/o acute findings.   Assessment & Plan:   # Altered mental status likely acute metabolic encephalopathy versus medication related versus chronic cognitive decline: CT scan of head showed mildly advanced cerebral atrophy with minimal chronic ischemic changes. No acute finding. -TSH, vitamin R48, folic acid level acceptable. HIV and RPR nonreactive. -Already evaluated by psychiatrist and adjusted medications. They recommended neurology consult. Discussed with Dr. Leonel Ramsay for the consult. -No signs of infection noted at this time -Continue supportive care.  # Acute kidney injury on CKD stage III -Serum creatinine level is stable. Avoid nephrotoxins. Monitor urine output.  # PTSD/anxiety/attention deficit disorder/bipolar disorde, Suicidal ideation -Medications adjusted by psychiatrist. Continue suicide precautions. Continue restraint. Psychiatrist recommended inpatient psych admission when medically ready. I'll consult Education officer, [employer]. Continue supportive care.  #History of seizure disorder -Continue Depakote. Ativan as needed. No seizure-like activity. Neurology consult today.  # Hypertension -Continue Norvasc and propranolol. Monitor blood pressure and heart rate.  # Hypokalemia: Improved. Magnesium level acceptable.  #Chronic thrombocytopenia: No sign of bleeding. Monitor platelet counts.   Principal Problem:  Acute metabolic encephalopathy Active Problems:   Bipolar disorder ([place])   Anxiety state   Depression   Essential hypertension   Diabetes type 2, uncontrolled (HCC)   Hypokalemia   Seizures (HCC)   HLD (hyperlipidemia)   PTSD (post-traumatic stress disorder)   Acute encephalopathy   CRI (chronic renal insufficiency), stage 3 (moderate)   Fall  DVT prophylaxis: Lovenox subcutaneous Code Status: Full code Family Communication: No family at bedside Disposition Plan: Likely discharge to inpatient psych in 1-2 days    Consultants:   Psychiatrist  Procedures: None Antimicrobials: None  Subjective: Seen and examined at bedside. Sleeping but alert awake while calling with the name. Denies any needs. Denied headache, dizziness, chest pain or shortness of breath.  Objective: Vitals:   09/10/16 0500 09/10/16 0739 09/10/16 1016 09/10/16 1029  BP: 116/78 128/72 121/80   Pulse: (!) 56 (!) 59    Resp: 15 11    Temp: 98.3 F (36.8 C) 97.7 F (36.5 C)    TempSrc: Oral Oral    SpO2: 97% 96%    Weight:    94.4 kg (208 lb 1.8 oz)  Height:    6\' 4"  (1.93 m)    Intake/Output Summary (Last 24 hours) at 09/10/16 1216 Last data filed at 09/10/16 5462  Gross per 24 hour  Intake              630 ml  Output             2500 ml  Net            -1870 ml   Filed Weights   09/10/16 1029  Weight: 94.4 kg (208 lb 1.8 oz)    Examination:  General exam: Mostly somnolent this morning but awakening with the name.  Respiratory system: Clear to auscultation. Respiratory effort normal. No wheezing or crackle Cardiovascular system: S1 & S2 heard, RRR.  No pedal edema. Gastrointestinal system: Abdomen is nondistended, soft and nontender. Normal bowel sounds heard. Central nervous system: somnolent but responding with the name and following commands. Extremities: Symmetric 5 x 5 power. Skin: No rashes, lesions or ulcers Psychiatry: Unable to assess this morning.  Data Reviewed: I have  personally reviewed following labs and imaging studies  CBC:  Recent Labs Lab 09/07/16 1734 09/08/16 0220 09/09/16 0416 09/10/16 0752  WBC 6.1 5.5 9.2 5.3  NEUTROABS 4.3  --   --   --   HGB 14.1 12.9* 15.2 13.6  HCT 39.5 37.3* 43.4 40.3  MCV 86.6 87.1 88.0 89.4  PLT 103* 88* 105* 81*   Basic Metabolic Panel:  Recent Labs Lab 09/07/16 1734 09/08/16 0220 09/09/16 0416 09/10/16 0752  NA 135 138 139 139  K 3.4* 3.3* 3.2* 3.8  CL 103 107 104 108  CO2 23 23 25 25   GLUCOSE 144* 96 101* 126*  BUN 22* 16 15 21*  CREATININE 2.19* 1.82* 1.96* 1.99*  CALCIUM 9.2 8.6* 9.6 8.8*  MG  --   --  1.9  --   PHOS  --   --  2.3*  --    GFR: Estimated Creatinine Clearance: 55.7 mL/min (A) (by C-G formula based on SCr of 1.99 mg/dL (H)). Liver Function Tests:  Recent Labs Lab 09/07/16 1734  AST 46*  ALT 30  ALKPHOS 106  BILITOT 1.1  PROT 6.8  ALBUMIN 4.0   No results for input(s): LIPASE, AMYLASE in the last 168 hours.  Recent Labs Lab 09/07/16 1734  AMMONIA 43*   Coagulation Profile:  Recent Labs Lab 09/07/16 1734  INR 1.09   Cardiac Enzymes:  Recent Labs Lab 09/07/16 1733  TROPONINI <0.03   BNP (last 3 results) No results for input(s): PROBNP in the last 8760 hours. HbA1C: No results for input(s): HGBA1C in the last 72 hours. CBG:  Recent Labs Lab 09/09/16 0311 09/09/16 0808 09/09/16 1313 09/09/16 1917 09/10/16 0733  GLUCAP 119* 122* 135* 140* 121*   Lipid Profile: No results for input(s): CHOL, HDL, LDLCALC, TRIG, CHOLHDL, LDLDIRECT in the last 72 hours. Thyroid Function Tests:  Recent Labs  09/09/16 0416  TSH 1.913   Anemia Panel:  Recent Labs  09/09/16 0416  VITAMINB12 356  FOLATE 5.9*   Sepsis Labs: No results for input(s): PROCALCITON, LATICACIDVEN in the last 168 hours.  Recent Results (from the past 240 hour(s))  MRSA PCR Screening     Status: None   Collection Time: 09/08/16  3:22 AM  Result Value Ref Range Status   MRSA  by PCR NEGATIVE NEGATIVE Final    Comment:        The GeneXpert MRSA Assay (FDA approved for NASAL specimens only), is one component of a comprehensive MRSA colonization surveillance program. It is not intended to diagnose MRSA infection nor to guide or monitor treatment for MRSA infections.          Radiology Studies: No results found.      Scheduled Meds: . amLODipine  10 mg Oral Daily  . divalproex  1,500 mg Oral QHS  . folic acid  1 mg Oral Daily  . heparin  5,000 Units Subcutaneous Q8H  . insulin aspart  0-9 Units Subcutaneous TID WC  . LORazepam  1 mg Oral TID  . propranolol  20 mg Oral BID  . QUEtiapine  200 mg Oral QHS  . sodium chloride flush  3 mL Intravenous Q12H  . traZODone  100 mg  Oral QHS   Continuous Infusions: . dextrose 5 % and 0.9 % NaCl with KCl 40 mEq/L 75 mL/hr at 09/10/16 1017     LOS: 3 days    Arlesia Kiel Tanna Furry, MD [facility] Pager [phone]  If 7PM-7AM, please contact night-coverage [email or web address] Password [email or web address] 09/10/2016, 12:16 PM

## 2016-09-10 NOTE — Consult Note (Signed)
NEURO HOSPITALIST CONSULT NOTE   Requesting physician: Dr. Carolin Sicks  Reason for Consult: ?Acute metabolic encephalopathy  History obtained from:  Chart and family  HPI:                                                                                                                                          [name] is an 51 y.o. male who presented to [facility] Emergency Department on Sunday, 20May2018 with altered mental status, auditory and visual hallucinations, aphasia and inability to walk. Per the chart, this behavior began on [id number].   On my visit, his conversation is tangential and states that "craziness" brought him to the ED. He openly reports active auditory and visual hallucinations, believing I was interrupting him when he was speaking to me. Additionally, he endorses homicidal ideation and believes he is "crazy", but wants help.   I spoke with [name] sister on the phone [name], [phone]). She stated that [name] currently lives with her and is independent with his ADLs - however, he does not pay the bills and she helps organize his medications. She noted that he experienced an "event" in his teens or early 70s that caused him to have ongoing depression, anxiety, including PTSD and bipolar disorder. He intermittently saw psychiatric help and psychological counseling for these conditions. He had a "drug problem" when he was younger, but he stopped doing drugs when he was in his 24s. He developed a seizure disorder in his early 75s, but is unsure how it was managed. 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 At this time, he became unable to maintain a job - mundane tasks would make him have a "nervous breakdown". She stated that the visual and auditory hallucinations began approximately one year ago, and they have gotten progressively worse  until the event this past weekend which brought him here. Over the past year, she has had to leave notes around the house for him to (i.e) remember to lock the door and turn off the stove.  In addition to the above history, PMH is significant for diabetes, GERD, CKD stage III, HIV prophylaxis, hypogonadism.  [name] has had two suicide attempts. Family history is significant for mental health disorders, including a suicide completion by the grandfather and nervous breakdowns by the mother.  [name] sister is happy to come in to speak on behalf of her brother. She requests to talk to a case worker in order to make herself his medical/power of attorney.  Past Medical History:  Diagnosis Date  . ADD (attention deficit disorder)   . Anxiety   . Arthritis    right hip  . Bipolar  1 disorder ([place])   . Blood in urine   . CKD (chronic kidney disease), stage III   . Creatinine elevation   . Depression    bipolar [facility]  . Diabetes mellitus without complication ([place])   . Family history of anesthesia complication    pt is unsure , but pt father may have been difficult to arouse   . History of kidney stones   . Hypertension   . Hypogonadism male   . Liver fatty degeneration   . Microscopic hematuria    hereditary s/p Urology eval  . Pneumonia 10-2012  . Polysubstance dependence, non-opioid, in remission ([place])    remote  . PTSD (post-traumatic stress disorder)    SOCIAL ANXIETY DISORDER     Past Surgical History:  Procedure Laterality Date  . BACK SURGERY    . CLOSED REDUCTION METACARPAL WITH PERCUTANEOUS PINNING Right   . LUMBAR DISC SURGERY    . TONSILLECTOMY    . TOTAL HIP ARTHROPLASTY Right 08/16/2013   Procedure: TOTAL HIP ARTHROPLASTY ANTERIOR APPROACH;  Surgeon: Hessie Dibble, MD;  Location: [place];  Service: Orthopedics;  Laterality: Right;  . TOTAL HIP ARTHROPLASTY Left 05/22/2015   Procedure: TOTAL HIP ARTHROPLASTY ANTERIOR APPROACH;  Surgeon: Melrose Nakayama,  MD;  Location: [place];  Service: Orthopedics;  Laterality: Left;    Family History  Problem Relation Age of Onset  . Diabetes Father   . Cancer Mother        died of melanoma with mets  . Cervical cancer Sister   . Diabetes Sister   . Other Neg Hx        hypogonadism   Social History:  reports that he quit smoking about 2 years ago. His smoking use included Cigarettes. He quit after 0.00 years of use. He has never used smokeless tobacco. He reports that he does not drink alcohol or use drugs.  Allergies  Allergen Reactions  . Vicodin [Hydrocodone-Acetaminophen] Itching    MEDICATIONS:                                                                                                                   Current Meds  Medication Sig  . amLODipine (NORVASC) 5 MG tablet Take 5 mg by mouth daily.   Marland Kitchen atorvastatin (LIPITOR) 10 MG tablet Take 10 mg by mouth daily.  . benztropine (COGENTIN) 0.5 MG tablet Take 0.5 mg by mouth 2 (two) times daily.  Marland Kitchen buPROPion (WELLBUTRIN XL) 300 MG 24 hr tablet Take 300 mg by mouth daily.  . clonazePAM (KLONOPIN) 2 MG tablet TAKE 1 TABLET BY MOUTH TWICE DAILY AS NEEDED (Patient taking differently: TAKE 1 TABLET BY MOUTH TWICE DAILY AS NEEDED FOR ANXIETY)  . divalproex (DEPAKOTE) 500 MG DR tablet Take 3 tablets (1,500 mg total) by mouth daily. (Patient taking differently: Take 1,500 mg by mouth at bedtime. )  . insulin lispro (HUMALOG KWIKPEN) 100 UNIT/ML KiwkPen Inject 10 units before each meal (total of 3 times per day). (Patient taking differently:  Inject 10 Units into the skin 3 (three) times daily. Inject 10 units before each meal (total of 3 times per day).)  . losartan-hydrochlorothiazide (HYZAAR) 100-12.5 MG tablet Take 1 tablet by mouth daily.  . methocarbamol (ROBAXIN) 500 MG tablet Take 1 tablet (500 mg total) by mouth 2 (two) times daily.  Marland Kitchen oxyCODONE-acetaminophen (PERCOCET/ROXICET) 5-325 MG tablet Take 1-2 tablets by mouth every 4 (four) hours as needed  for severe pain.  . prazosin (MINIPRESS) 1 MG capsule Take 1 mg by mouth at bedtime. For nightmares  . propranolol (INDERAL) 20 MG tablet Take 1 tablet (20 mg total) by mouth 2 (two) times daily.  . Testosterone (ANDROGEL) 20.25 MG/1.25GM (1.62%) GEL Apply 20.25 mg topically 3 (three) times daily. Place the topically solution under (1) arm pit alternating with each application. Total daily amount of medication equals (60.75 mg).  . traMADol (ULTRAM) 50 MG tablet Take 50 mg by mouth every 8 (eight) hours as needed (pain).  . traZODone (DESYREL) 100 MG tablet Take 150 mg by mouth at bedtime as needed for sleep.  . traZODone (DESYREL) 150 MG tablet Take 1 tablet (150 mg total) by mouth at bedtime.  . TRUVADA 200-300 MG tablet Take 1 tablet by mouth daily. Take 1 tablet by mouth daily (Patient taking differently: Take 1 tablet by mouth daily. )    Review Of Systems:                                                                                                           History obtained from the patient  General: Feels "crazy". Tremor reported but not current Psychological: As in the HPI Ophthalmic: Negative for blurry or double vision, loss of vision in any field or eye pain ENT: Negative for epistaxis, nasal discharge, abrupt loss of hearing or vertigo Hematological: Negative for known or experienced bleeding problems Respiratory: Negative for cough, shortness of breath or wheezing Cardiovascular: Negative for chest pain, edema or irregular heartbeat Gastrointestinal: Negative for nausea/vomiting Genito-Urinary: Positive for occasional urinary retention Musculoskeletal: Negative for joint swelling. Complains of chronic hip weakness and walking difficulties since bilateral hip replacements Neurological: As noted in HPI Dermatological: Negative for numbness or tingling  Blood pressure 132/89, pulse (!) 58, temperature 97.5 F (36.4 C), temperature source Oral, resp. rate 13, height 6\' 4"  (1.93  m), weight 94.4 kg (208 lb 1.8 oz), SpO2 98 %.   Physical Examination:                                                                                                      General: WDWN pleasant male. Appears calm and comfortable in  bed. On 4-point restraints with weighted gloves. HEENT: Normocephalic, no lesions, without obvious abnormality.  Normal external eye and conjunctiva. Normal external nose, mucus membranes and septum.  Normal pharynx. Cardiovascular: S1, S2 normal, pulses palpable throughout   Pulmonary: chest clear, no wheezing, rales, normal symmetric air entry Abdomen: soft, non-tender; bowel sounds normal; no masses,  no organomegaly Extremities: no joint deformities, effusion, or inflammation Musculoskeletal: no joint tenderness, deformity or swelling. Normal tone throughout; no atrophy noted Skin: warm and dry, no hyperpigmentation, vitiligo, or suspicious lesions  Neurological Examination:                                                                                               Mental Status: [name] is alert, oriented x 4. He converses appropriately, but jokes at times. He actively endorses both auditory and visual hallucinations.  Speech is fluent without evidence of aphasia. Able to follow 3-step commands without difficulty. He is able to give the number of quarters in $2.75, but unable to spell world backwards Cranial Nerves: II: Visual fields grossly normal, pupils are equal, round, reactive to light and accommodation. III,IV, VI: Ptosis not present, extra-ocular muscle movements intact bilaterally V,VII: Smile and eyebrow raise is symmetric. Facial light touch and pinprick sensation intact bilaterally VIII: Hearing grossly intact IX,X: Uvula and palate rise symmetrically XI: SCM and bilateral shoulder shrug strength 5/5 XII: Midline tongue extension Motor:  He has good strength throughout Sensory: He has a decrease sensation in a stocking sensation bilateral  below the knee, also decreased sensation on the left leg compared to the right. Deep Tendon Reflexes: 2+ and symmetric in the arms, and legs he has depressed reflexes. Plantars: Right: downgoing   Left: downgoing Cerebellar: Mild tremor on finger nose finger bilaterally, intentional Proprioception: Vibratory sense intact Gait: Normal gait and station   Lab Results: Basic Metabolic Panel:  Recent Labs Lab 09/07/16 1734 09/08/16 0220 09/09/16 0416 09/10/16 0752  NA 135 138 139 139  K 3.4* 3.3* 3.2* 3.8  CL 103 107 104 108  CO2 23 23 25 25   GLUCOSE 144* 96 101* 126*  BUN 22* 16 15 21*  CREATININE 2.19* 1.82* 1.96* 1.99*  CALCIUM 9.2 8.6* 9.6 8.8*  MG  --   --  1.9  --   PHOS  --   --  2.3*  --     Liver Function Tests:  Recent Labs Lab 09/07/16 1734  AST 46*  ALT 30  ALKPHOS 106  BILITOT 1.1  PROT 6.8  ALBUMIN 4.0   No results for input(s): LIPASE, AMYLASE in the last 168 hours.  Recent Labs Lab 09/07/16 1734  AMMONIA 43*    CBC:  Recent Labs Lab 09/07/16 1734 09/08/16 0220 09/09/16 0416 09/10/16 0752  WBC 6.1 5.5 9.2 5.3  NEUTROABS 4.3  --   --   --   HGB 14.1 12.9* 15.2 13.6  HCT 39.5 37.3* 43.4 40.3  MCV 86.6 87.1 88.0 89.4  PLT 103* 88* 105* 81*    Cardiac Enzymes:  Recent Labs Lab 09/07/16 1733  TROPONINI <0.03    Lipid Panel:  No results for input(s): CHOL, TRIG, HDL, CHOLHDL, VLDL, LDLCALC in the last 168 hours.  CBG:  Recent Labs Lab 09/09/16 0808 09/09/16 1313 09/09/16 1917 09/10/16 0733 09/10/16 1243  GLUCAP 122* 135* 140* 121* 112*    Microbiology: Results for orders placed or performed during the hospital encounter of 09/07/16  MRSA PCR Screening     Status: None   Collection Time: 09/08/16  3:22 AM  Result Value Ref Range Status   MRSA by PCR NEGATIVE NEGATIVE Final    Comment:        The GeneXpert MRSA Assay (FDA approved for NASAL specimens only), is one component of a comprehensive MRSA  colonization surveillance program. It is not intended to diagnose MRSA infection nor to guide or monitor treatment for MRSA infections.     Coagulation Studies:  Recent Labs  09/07/16 1734  LABPROT 14.2  INR 1.09    Imaging: No results found.  Thank you for consulting the [facility] team. Assessment and plan per attending neurologist.   Solon Augusta PA-C [facility]  09/10/2016, 1:53 PM  Izora Gala the patient reviewed the above note. Of note, he states that the tremor and sensory problems long predated Depakote  Assessment and Plan: 51 year old male with likely multifactorial gait disturbance related to neuropathy, previous hip problems, polypharmacy. It is difficult to say how much of his cognitive difficulty is polypharmacy versus underlying pathology. He has been on multiple medications for a long period of time since his 37s, and his cerebral atrophy could be related to this, or to alcohol if he was a heavy drinker in the past.  His presentation is unusual, and though predominantly psychiatric (e.g. depression related psychosis) is very much in consideration, I do think further imaging would be prudent. He had a lumbar spine MRI done recently, but I would favor imaging higher in the spine to assess for possible causes of myelopathy that could be masked by neuropathy. Also would favor getting an MRI of the brain.  It is unclear to me exactly why he was on Truvada, but this is known to cause neuropathy. I would not favor restarting this without compelling indication.   1) agree with down titration of medications as per psychiatry 2) MRI brain, T-spine, C-spine 3) serum protein electrophoresis/ifx (an evaluation of neuropathy)  further recommendations following above imaging 4) neurology will continue to follow

## 2016-09-11 DIAGNOSIS — F0390 Unspecified dementia without behavioral disturbance: Secondary | ICD-10-CM

## 2016-09-11 DIAGNOSIS — W19XXXA Unspecified fall, initial encounter: Secondary | ICD-10-CM

## 2016-09-11 DIAGNOSIS — F3341 Major depressive disorder, recurrent, in partial remission: Secondary | ICD-10-CM

## 2016-09-11 LAB — PROTEIN ELECTROPHORESIS, SERUM
A/G RATIO SPE: 1.2 (ref 0.7–1.7)
ALBUMIN ELP: 3.5 g/dL (ref 2.9–4.4)
Alpha-1-Globulin: 0.2 g/dL (ref 0.0–0.4)
Alpha-2-Globulin: 0.8 g/dL (ref 0.4–1.0)
BETA GLOBULIN: 0.9 g/dL (ref 0.7–1.3)
Gamma Globulin: 1.1 g/dL (ref 0.4–1.8)
Globulin, Total: 3 g/dL (ref 2.2–3.9)
TOTAL PROTEIN ELP: 6.5 g/dL (ref 6.0–8.5)

## 2016-09-11 LAB — GLUCOSE, CAPILLARY
Glucose-Capillary: 127 mg/dL — ABNORMAL HIGH (ref 65–99)
Glucose-Capillary: 123 mg/dL — ABNORMAL HIGH (ref 65–99)
Glucose-Capillary: 112 mg/dL — ABNORMAL HIGH (ref 65–99)
Glucose-Capillary: 136 mg/dL — ABNORMAL HIGH (ref 65–99)
Glucose-Capillary: 149 mg/dL — ABNORMAL HIGH (ref 65–99)

## 2016-09-11 MED ORDER — ENOXAPARIN SODIUM 40 MG/0.4ML ~~LOC~~ SOLN
40.0000 mg | SUBCUTANEOUS | Status: DC
  Administered 2016-09-11 – 2016-10-08 (×28): 40 mg via SUBCUTANEOUS
  Filled 2016-09-11 (×28): qty 0.4

## 2016-09-11 MED ORDER — LORAZEPAM 0.5 MG PO TABS
0.5000 mg | ORAL_TABLET | Freq: Three times a day (TID) | ORAL | Status: DC
  Administered 2016-09-11 – 2016-09-15 (×13): 0.5 mg via ORAL
  Filled 2016-09-11 (×13): qty 1

## 2016-09-11 NOTE — Progress Notes (Signed)
Subjective: No significant changes.   Exam: Vitals:   09/11/16 0331 09/11/16 0700  BP: 121/73 (!) 142/87  Pulse: (!) 53 (!) 54  Resp: 16   Temp: 97.3 F (36.3 C) 98.2 F (36.8 C)   Gen: In bed, NAD Resp: non-labored breathing, no acute distress Abd: soft, nt  Neuro: MS: awake, alert, interactive and appropriate CN: VFF, face symmetric Motor: 5/5 throughout Sensory:Decreased distally to LT below the knee.   Pertinent Labs: SPEP -pending.   Impression: 51 yo M with longstanding psychiatric disease and more recent cognitive decline and difficulty walking.  I do wonder if truvada could be related to his neuropathy. I discussed this with him and that unless there was a reason to take it, I would advise against restarting it. (Patient states that he is celibate and not at risk for contracting HIV). I asked why he is on it and he gives a vague answer about bad experiences with his friends, which if he is not at high risk of HIV, I would not think would be a compelling reason to take this.   He does have some moderate cervical stenosis and could consider discussing with neurosurgery as outpatient, but without any myelopathic signs, I think unlikely that this is contributing to his symptoms. I think his atrophy could be related to his long term medication use, I don't think that any further testing is likely to be helpful at this time, but would favor outpatient   Recommendations: 1) Would follow up with neurology as outpatient for EMG/neuropsych testing.  2) Would favor limiting truvada given I can figure out no indication in him for taking it, unless he is not being forthcoming.  3) Will need SPEP follow up, referral to hematology if positive.  4) No further neurological testing at this time, please call with further questions or concerns.   Roland Rack, MD [facility] [phone]  If 7pm- 7am, please page neurology on call as listed in [place].

## 2016-09-11 NOTE — BH Assessment (Signed)
Pt chart under review for possible [facility] psychiatric unit admission.

## 2016-09-11 NOTE — Clinical Social Work Note (Signed)
Clinical Social Work Assessment  Patient Details  Name: [name] MRN: [id number] Date of Birth: 08-Apr-1966  Date of referral:  09/11/16               Reason for consult:  Facility Placement, Discharge Planning                Permission sought to share information with:  Facility Art therapist granted to share information::  Yes, Verbal Permission Granted  Name::        Agency::  Inpatient psych facilities  Relationship::     Contact Information:     Housing/Transportation Living arrangements for the past 2 months:  Single Family Home Source of Information:  Patient, Medical Team Patient Interpreter Needed:  None Criminal Activity/Legal Involvement Pertinent to Current Situation/Hospitalization:  No - Comment as needed Significant Relationships:  Siblings Lives with:  Siblings Do you feel safe going back to the place where you live?  No Need for family participation in patient care:  Yes (Comment)  Care giving concerns:  Psychiatrist recommending inpatient psych admission.   Social Worker assessment / plan:  CSW met with patient. Sitter at bedside. CSW introduced role and explained that discharge planning would be discussed. Patient confirmed that he is agreeable to inpatient psychiatric admission. Patient asked how long it would take. CSW explained referral process. Per MD, patient is stable for discharge today. CSW made referral to [facility] and [facility]. No further concerns. CSW encouraged patient to contact CSW as needed. CSW will continue to follow patient for support and facilitate discharge to an inpatient psych facility once a bed is available.  Employment status:  Unemployed Forensic scientist:  Self Pay (Medicaid Pending) PT Recommendations:  Not assessed at this time Information / Referral to community resources:  Inpatient Psychiatric Care (Comment Required)  Patient/Family's Response to care:  Patient agreeable to inpatient psych admission. Patient's  sister supportive and involved in patient's care. Patient appreciated social work intervention.  Patient/Family's Understanding of and Emotional Response to Diagnosis, Current Treatment, and Prognosis:  Patient has a good understanding of the reason for admission and his need for an inpatient psych admission at this time. Patient appears pleased with hospital care.  Emotional Assessment Appearance:  Appears stated age Attitude/Demeanor/Rapport:  Other (Pleasant) Affect (typically observed):  Accepting, Appropriate, Calm, Pleasant Orientation:  Oriented to Self, Oriented to Place, Oriented to  Time, Oriented to Situation Alcohol / Substance use:  Tobacco Use Psych involvement (Current and /or in the community):  No (Comment)  Discharge Needs  Concerns to be addressed:  Care Coordination, Mental Health Concerns Readmission within the last 30 days:  No Current discharge risk:  Psychiatric Illness Barriers to Discharge:  Inadequate or no insurance, Other (Bed availability at inpatient psych facility)   Candie Chroman, LCSW 09/11/2016, 11:30 AM

## 2016-09-11 NOTE — Progress Notes (Addendum)
PROGRESS NOTE    [name]  KYH:[id number] DOB: 08/16/1965 DOA: 09/07/2016 PCP: Beverly Sessions   Brief Narrative: 51 y.o.malewith history significant of PTSD, bipolar disorder, ADD, depression, anxiety, GERD, CKD stage III, seizure d/o, HTN, HLD, and DM who presentedwith altered mental status. The patient's sister reported he had been cognitively declining for the past year w/ multiple falls at home. He has not been compliant w/ his medications. He had recentlybecome more confused and restless, reporting auditory and visual hallucinations. CT head in the ED was w/o acute findings.   Assessment & Plan:   # Altered mental status likely acute metabolic encephalopathy versus medication related versus chronic cognitive decline: CT scan of head showed mildly advanced cerebral atrophy with minimal chronic ischemic changes. No acute finding. -TSH, vitamin T51, folic acid level acceptable. HIV and RPR nonreactive. -Already evaluated by psychiatrist and adjusted medications. They recommended neurology consult. Evaluated by neurologist and obtained MRI of the brain and spine with no acute finding. Discussed with Dr. Leonel Ramsay today, he discontinued Truvada as there is no clear indication for this medication. Mental status seems gradually improving. -No signs of infection noted at this time -Continue supportive care. Discontinue Foley catheter and do bladder scan.  # Acute kidney injury on CKD stage III -Serum creatinine level is stable. Avoid nephrotoxins. Monitor urine output.  # PTSD/anxiety/attention deficit disorder/bipolar disorde, Suicidal ideation -Medications adjusted by psychiatrist. Continue suicide precautions.  Psychiatrist recommended inpatient psych admission. Patient is medically stable to transfer his care to inpatient psych. He still has low mood. Social worker following for inpatient rehabilitation discharge. Continue current management. -Taper Ativan 0.5 three times a  day.  #History of seizure disorder -Continue Depakote. Ativan as needed. No seizure-like activity. Neurology consult appreciated.  # Hypertension -Continue Norvasc and propranolol. Monitor blood pressure and heart rate.  # Hypokalemia: Improved. Magnesium level acceptable.  #Chronic thrombocytopenia: No sign of bleeding. Monitor platelet counts.   Principal Problem:   Acute metabolic encephalopathy Active Problems:   Bipolar disorder ([place])   Anxiety state   Depression   Essential hypertension   Diabetes type 2, uncontrolled ([facility])   Hypokalemia   Seizures ([facility])   HLD (hyperlipidemia)   PTSD (post-traumatic stress disorder)   Acute encephalopathy   CRI (chronic renal insufficiency), stage 3 (moderate)   Fall  DVT prophylaxis: Lovenox subcutaneous Code Status: Full code Family Communication: No family at bedside Disposition Plan: Likely discharge to inpatient psych in 1-2 days, discussed with the social worker    Consultants:   Psychiatrist  Procedures: None Antimicrobials: None  Subjective: Seen and examined at bedside. Looks more alert awake and talking. He still has low mood. Denied headache, dizziness, nausea, vomiting. Has generalized weakness.  Objective: Vitals:   09/10/16 2300 09/11/16 0331 09/11/16 0700 09/11/16 1020  BP: 137/84 121/73 (!) 142/87 138/83  Pulse: 64 (!) 53 (!) 54   Resp: 12 16    Temp: 98.3 F (36.8 C) 97.3 F (36.3 C) 98.2 F (36.8 C)   TempSrc: Oral Oral Oral   SpO2: 95% 96% 96%   Weight:      Height:        Intake/Output Summary (Last 24 hours) at 09/11/16 1054 Last data filed at 09/11/16 1004  Gross per 24 hour  Intake             1500 ml  Output             3450 ml  Net            -  1950 ml   Filed Weights   09/10/16 1029  Weight: 94.4 kg (208 lb 1.8 oz)    Examination:  General exam: Alert awake lying in bed, not in distress  Respiratory system: Clear to clear bilateral. Respiratory effort normal. No wheezing or  crackle Cardiovascular system: S1 & S2 heard, RRR.  No pedal edema. Gastrointestinal system: Abdomen is nondistended, soft and nontender. Normal bowel sounds heard. Central nervous system: Following commands, nonfocal Extremities: Symmetric 5 x 5 power. Skin: No rashes, lesions or ulcers Psychiatry: Depressed mood.  Data Reviewed: I have personally reviewed following labs and imaging studies  CBC:  Recent Labs Lab 09/07/16 1734 09/08/16 0220 09/09/16 0416 09/10/16 0752  WBC 6.1 5.5 9.2 5.3  NEUTROABS 4.3  --   --   --   HGB 14.1 12.9* 15.2 13.6  HCT 39.5 37.3* 43.4 40.3  MCV 86.6 87.1 88.0 89.4  PLT 103* 88* 105* 81*   Basic Metabolic Panel:  Recent Labs Lab 09/07/16 1734 09/08/16 0220 09/09/16 0416 09/10/16 0752  NA 135 138 139 139  K 3.4* 3.3* 3.2* 3.8  CL 103 107 104 108  CO2 23 23 25 25   GLUCOSE 144* 96 101* 126*  BUN 22* 16 15 21*  CREATININE 2.19* 1.82* 1.96* 1.99*  CALCIUM 9.2 8.6* 9.6 8.8*  MG  --   --  1.9  --   PHOS  --   --  2.3*  --    GFR: Estimated Creatinine Clearance: 55.7 mL/min (A) (by C-G formula based on SCr of 1.99 mg/dL (H)). Liver Function Tests:  Recent Labs Lab 09/07/16 1734  AST 46*  ALT 30  ALKPHOS 106  BILITOT 1.1  PROT 6.8  ALBUMIN 4.0   No results for input(s): LIPASE, AMYLASE in the last 168 hours.  Recent Labs Lab 09/07/16 1734  AMMONIA 43*   Coagulation Profile:  Recent Labs Lab 09/07/16 1734  INR 1.09   Cardiac Enzymes:  Recent Labs Lab 09/07/16 1733  TROPONINI <0.03   BNP (last 3 results) No results for input(s): PROBNP in the last 8760 hours. HbA1C: No results for input(s): HGBA1C in the last 72 hours. CBG:  Recent Labs Lab 09/10/16 0733 09/10/16 1243 09/10/16 1622 09/10/16 2249 09/11/16 0717  GLUCAP 121* 112* 138* 127* 123*   Lipid Profile: No results for input(s): CHOL, HDL, LDLCALC, TRIG, CHOLHDL, LDLDIRECT in the last 72 hours. Thyroid Function Tests:  Recent Labs  09/09/16 0416   TSH 1.913   Anemia Panel:  Recent Labs  09/09/16 0416  VITAMINB12 356  FOLATE 5.9*   Sepsis Labs: No results for input(s): PROCALCITON, LATICACIDVEN in the last 168 hours.  Recent Results (from the past 240 hour(s))  MRSA PCR Screening     Status: None   Collection Time: 09/08/16  3:22 AM  Result Value Ref Range Status   MRSA by PCR NEGATIVE NEGATIVE Final    Comment:        The GeneXpert MRSA Assay (FDA approved for NASAL specimens only), is one component of a comprehensive MRSA colonization surveillance program. It is not intended to diagnose MRSA infection nor to guide or monitor treatment for MRSA infections.          Radiology Studies: Mr Brain Wo Contrast  Result Date: 09/10/2016 CLINICAL DATA:  Initial evaluation for probable multifactorial gait disturbance. EXAM: MRI HEAD WITHOUT CONTRAST MRI CERVICAL SPINE WITHOUT CONTRAST MRI THORACIC SPINE WITHOUT CONTRAST TECHNIQUE: Multiplanar, multiecho pulse sequences of the brain and surrounding structures, and cervical  spine, to include the craniocervical junction and cervicothoracic junction, and thoracic spine were obtained without intravenous contrast. COMPARISON:  None available. FINDINGS: MRI HEAD FINDINGS Brain: Study moderately degraded by motion artifact. Diffuse prominence of the CSF containing spaces compatible with generalized cerebral atrophy, advanced for age. No focal parenchymal signal abnormality identified. No significant cerebral white matter disease. No evidence for acute infarct. Gray-white matter differentiation maintained. No encephalomalacia to suggest chronic infarction. No evidence for acute or chronic intracranial hemorrhage. No mass lesion, midline shift or mass effect. Mild ventricular prominence related to global parenchymal volume loss of hydrocephalus. No extra-axial fluid collection. Major dural sinuses are grossly patent. Pituitary gland suprasellar region within normal limits. Midline  structures intact and normal. Vascular: Major intracranial vascular flow voids maintained. Left vertebral artery dominant and tortuous, invaginating upon the left aspect of the medulla. Skull and upper cervical spine: Craniocervical junction within normal limits. Bone marrow signal intensity normal. No scalp soft tissue abnormality. Sinuses/Orbits: Globes and orbital soft tissues within normal limits. Paranasal sinuses are clear. No mastoid effusion. Inner ear structures normal. Other: None. MRI CERVICAL SPINE FINDINGS Alignment: Study moderately degraded by motion artifact. Vertebral bodies normally aligned with preservation of the normal cervical lordosis. No listhesis. Vertebrae: Vertebral body heights maintained. No evidence for acute or chronic fracture. Signal intensity within the vertebral body bone marrow somewhat heterogeneous. T1/T2 hyperintensity within the inferior aspect of the C7 vertebral body likely likely reflects a benign hemangioma. No worrisome osseous lesions. No abnormal marrow edema. Cord: Signal intensity within the cervical spinal cord is normal. Posterior Fossa, vertebral arteries, paraspinal tissues: Paraspinous and prevertebral soft tissues within normal limits. Normal intravascular flow voids present within the dominant left vertebral artery. Right vertebral artery not well visualized. Disc levels: C2-C3: Unremarkable. C3-C4: Mild degenerative disc bulge with uncovertebral hypertrophy. No significant canal stenosis. Foramina are grossly patent. C4-C5: Diffuse degenerative disc bulge with bilateral uncovertebral spurring. Broad posterior disc osteophyte mildly flattens the ventral thecal sac without significant canal stenosis. Moderate bilateral C5 foraminal stenosis. C5-C6: Chronic diffuse degenerative disc osteophyte with intervertebral disc space narrowing. Superimposed right paracentral disc protrusion indents the right ventral thecal sac (series 14, image 21). Flattening of the right  hemi cord without cord signal changes. Moderate canal stenosis. Cephalad and caudad migration of disc material posterior to the C5 and C6 vertebral bodies (series 18, image 21 on axial, series 16, image 8 on sagittal). Moderate to severe bilateral C6 foraminal stenosis. C6-C7: Broad posterior disc protrusion flattens and partially faces the ventral thecal sac. Probable ligamentum flavum thickening. Resultant mild to moderate canal stenosis. No cord flattening or cord deformity identified on this motion degraded exam. No obvious foraminal encroachment. C7-T1:  Unremarkable. MRI THORACIC SPINE FINDINGS Alignment:  Study moderately degraded by motion artifact. Vertebral bodies normally aligned with preservation of the normal thoracic kyphosis. No listhesis. Vertebrae: Vertebral body heights maintained. No evidence for acute or chronic fracture. Signal intensity within the vertebral body bone marrow mildly heterogeneous. No worrisome osseous lesions. No abnormal marrow edema. Cord: Signal intensity within the thoracic spinal cord is normal. Paraspinous soft tissues: Paraspinous soft tissues within normal limits. Visualized visceral structures grossly unremarkable. Probable small sebaceous cyst noted within the subcutaneous fat of the mid back. Disc levels: Minimal disc bulging noted at T4-5, T5-6, and T8-9. No significant stenosis. Posterior element hypertrophy at T10-11 with resultant mild to moderate left foraminal stenosis (series 22, image 15). No other significant foraminal encroachment. IMPRESSION: MRI HEAD IMPRESSION: 1. No acute intracranial process  identified. 2. Moderately advanced cerebral atrophy for patient age. Otherwise unremarkable brain MRI. MRI CERVICAL SPINE IMPRESSION: 1. Large right paracentral disc protrusion at C5-6 with resultant moderate canal stenosis. Flattening of the right hemi cord without cord signal changes. 2. Broad posterior disc protrusion at C6-7 with mild to moderate canal stenosis.  3. Multifactorial degenerative changes with moderate to advanced bilateral C5 and C6 foraminal stenosis. MRI THORACIC SPINE IMPRESSION: 1. Normal MRI appearance of the thoracic spinal cord. No significant canal stenosis identified. 2. Mild degenerative disc bulging at T4-5, T5-6, and T8-9 without stenosis. 3. Left-sided posterior element hypertrophy at T10-11 with resultant mild to moderate left foraminal stenosis. Electronically Signed   By: Jeannine Boga M.D.   On: 09/10/2016 23:15   Mr Cervical Spine Wo Contrast  Result Date: 09/10/2016 CLINICAL DATA:  Initial evaluation for probable multifactorial gait disturbance. EXAM: MRI HEAD WITHOUT CONTRAST MRI CERVICAL SPINE WITHOUT CONTRAST MRI THORACIC SPINE WITHOUT CONTRAST TECHNIQUE: Multiplanar, multiecho pulse sequences of the brain and surrounding structures, and cervical spine, to include the craniocervical junction and cervicothoracic junction, and thoracic spine were obtained without intravenous contrast. COMPARISON:  None available. FINDINGS: MRI HEAD FINDINGS Brain: Study moderately degraded by motion artifact. Diffuse prominence of the CSF containing spaces compatible with generalized cerebral atrophy, advanced for age. No focal parenchymal signal abnormality identified. No significant cerebral white matter disease. No evidence for acute infarct. Gray-white matter differentiation maintained. No encephalomalacia to suggest chronic infarction. No evidence for acute or chronic intracranial hemorrhage. No mass lesion, midline shift or mass effect. Mild ventricular prominence related to global parenchymal volume loss of hydrocephalus. No extra-axial fluid collection. Major dural sinuses are grossly patent. Pituitary gland suprasellar region within normal limits. Midline structures intact and normal. Vascular: Major intracranial vascular flow voids maintained. Left vertebral artery dominant and tortuous, invaginating upon the left aspect of the medulla.  Skull and upper cervical spine: Craniocervical junction within normal limits. Bone marrow signal intensity normal. No scalp soft tissue abnormality. Sinuses/Orbits: Globes and orbital soft tissues within normal limits. Paranasal sinuses are clear. No mastoid effusion. Inner ear structures normal. Other: None. MRI CERVICAL SPINE FINDINGS Alignment: Study moderately degraded by motion artifact. Vertebral bodies normally aligned with preservation of the normal cervical lordosis. No listhesis. Vertebrae: Vertebral body heights maintained. No evidence for acute or chronic fracture. Signal intensity within the vertebral body bone marrow somewhat heterogeneous. T1/T2 hyperintensity within the inferior aspect of the C7 vertebral body likely likely reflects a benign hemangioma. No worrisome osseous lesions. No abnormal marrow edema. Cord: Signal intensity within the cervical spinal cord is normal. Posterior Fossa, vertebral arteries, paraspinal tissues: Paraspinous and prevertebral soft tissues within normal limits. Normal intravascular flow voids present within the dominant left vertebral artery. Right vertebral artery not well visualized. Disc levels: C2-C3: Unremarkable. C3-C4: Mild degenerative disc bulge with uncovertebral hypertrophy. No significant canal stenosis. Foramina are grossly patent. C4-C5: Diffuse degenerative disc bulge with bilateral uncovertebral spurring. Broad posterior disc osteophyte mildly flattens the ventral thecal sac without significant canal stenosis. Moderate bilateral C5 foraminal stenosis. C5-C6: Chronic diffuse degenerative disc osteophyte with intervertebral disc space narrowing. Superimposed right paracentral disc protrusion indents the right ventral thecal sac (series 14, image 21). Flattening of the right hemi cord without cord signal changes. Moderate canal stenosis. Cephalad and caudad migration of disc material posterior to the C5 and C6 vertebral bodies (series 18, image 21 on axial,  series 16, image 8 on sagittal). Moderate to severe bilateral C6 foraminal stenosis. C6-C7: Broad posterior  disc protrusion flattens and partially faces the ventral thecal sac. Probable ligamentum flavum thickening. Resultant mild to moderate canal stenosis. No cord flattening or cord deformity identified on this motion degraded exam. No obvious foraminal encroachment. C7-T1:  Unremarkable. MRI THORACIC SPINE FINDINGS Alignment:  Study moderately degraded by motion artifact. Vertebral bodies normally aligned with preservation of the normal thoracic kyphosis. No listhesis. Vertebrae: Vertebral body heights maintained. No evidence for acute or chronic fracture. Signal intensity within the vertebral body bone marrow mildly heterogeneous. No worrisome osseous lesions. No abnormal marrow edema. Cord: Signal intensity within the thoracic spinal cord is normal. Paraspinous soft tissues: Paraspinous soft tissues within normal limits. Visualized visceral structures grossly unremarkable. Probable small sebaceous cyst noted within the subcutaneous fat of the mid back. Disc levels: Minimal disc bulging noted at T4-5, T5-6, and T8-9. No significant stenosis. Posterior element hypertrophy at T10-11 with resultant mild to moderate left foraminal stenosis (series 22, image 15). No other significant foraminal encroachment. IMPRESSION: MRI HEAD IMPRESSION: 1. No acute intracranial process identified. 2. Moderately advanced cerebral atrophy for patient age. Otherwise unremarkable brain MRI. MRI CERVICAL SPINE IMPRESSION: 1. Large right paracentral disc protrusion at C5-6 with resultant moderate canal stenosis. Flattening of the right hemi cord without cord signal changes. 2. Broad posterior disc protrusion at C6-7 with mild to moderate canal stenosis. 3. Multifactorial degenerative changes with moderate to advanced bilateral C5 and C6 foraminal stenosis. MRI THORACIC SPINE IMPRESSION: 1. Normal MRI appearance of the thoracic spinal  cord. No significant canal stenosis identified. 2. Mild degenerative disc bulging at T4-5, T5-6, and T8-9 without stenosis. 3. Left-sided posterior element hypertrophy at T10-11 with resultant mild to moderate left foraminal stenosis. Electronically Signed   By: Jeannine Boga M.D.   On: 09/10/2016 23:15   Mr Thoracic Spine Wo Contrast  Result Date: 09/10/2016 CLINICAL DATA:  Initial evaluation for probable multifactorial gait disturbance. EXAM: MRI HEAD WITHOUT CONTRAST MRI CERVICAL SPINE WITHOUT CONTRAST MRI THORACIC SPINE WITHOUT CONTRAST TECHNIQUE: Multiplanar, multiecho pulse sequences of the brain and surrounding structures, and cervical spine, to include the craniocervical junction and cervicothoracic junction, and thoracic spine were obtained without intravenous contrast. COMPARISON:  None available. FINDINGS: MRI HEAD FINDINGS Brain: Study moderately degraded by motion artifact. Diffuse prominence of the CSF containing spaces compatible with generalized cerebral atrophy, advanced for age. No focal parenchymal signal abnormality identified. No significant cerebral white matter disease. No evidence for acute infarct. Gray-white matter differentiation maintained. No encephalomalacia to suggest chronic infarction. No evidence for acute or chronic intracranial hemorrhage. No mass lesion, midline shift or mass effect. Mild ventricular prominence related to global parenchymal volume loss of hydrocephalus. No extra-axial fluid collection. Major dural sinuses are grossly patent. Pituitary gland suprasellar region within normal limits. Midline structures intact and normal. Vascular: Major intracranial vascular flow voids maintained. Left vertebral artery dominant and tortuous, invaginating upon the left aspect of the medulla. Skull and upper cervical spine: Craniocervical junction within normal limits. Bone marrow signal intensity normal. No scalp soft tissue abnormality. Sinuses/Orbits: Globes and orbital  soft tissues within normal limits. Paranasal sinuses are clear. No mastoid effusion. Inner ear structures normal. Other: None. MRI CERVICAL SPINE FINDINGS Alignment: Study moderately degraded by motion artifact. Vertebral bodies normally aligned with preservation of the normal cervical lordosis. No listhesis. Vertebrae: Vertebral body heights maintained. No evidence for acute or chronic fracture. Signal intensity within the vertebral body bone marrow somewhat heterogeneous. T1/T2 hyperintensity within the inferior aspect of the C7 vertebral body likely likely reflects a  benign hemangioma. No worrisome osseous lesions. No abnormal marrow edema. Cord: Signal intensity within the cervical spinal cord is normal. Posterior Fossa, vertebral arteries, paraspinal tissues: Paraspinous and prevertebral soft tissues within normal limits. Normal intravascular flow voids present within the dominant left vertebral artery. Right vertebral artery not well visualized. Disc levels: C2-C3: Unremarkable. C3-C4: Mild degenerative disc bulge with uncovertebral hypertrophy. No significant canal stenosis. Foramina are grossly patent. C4-C5: Diffuse degenerative disc bulge with bilateral uncovertebral spurring. Broad posterior disc osteophyte mildly flattens the ventral thecal sac without significant canal stenosis. Moderate bilateral C5 foraminal stenosis. C5-C6: Chronic diffuse degenerative disc osteophyte with intervertebral disc space narrowing. Superimposed right paracentral disc protrusion indents the right ventral thecal sac (series 14, image 21). Flattening of the right hemi cord without cord signal changes. Moderate canal stenosis. Cephalad and caudad migration of disc material posterior to the C5 and C6 vertebral bodies (series 18, image 21 on axial, series 16, image 8 on sagittal). Moderate to severe bilateral C6 foraminal stenosis. C6-C7: Broad posterior disc protrusion flattens and partially faces the ventral thecal sac.  Probable ligamentum flavum thickening. Resultant mild to moderate canal stenosis. No cord flattening or cord deformity identified on this motion degraded exam. No obvious foraminal encroachment. C7-T1:  Unremarkable. MRI THORACIC SPINE FINDINGS Alignment:  Study moderately degraded by motion artifact. Vertebral bodies normally aligned with preservation of the normal thoracic kyphosis. No listhesis. Vertebrae: Vertebral body heights maintained. No evidence for acute or chronic fracture. Signal intensity within the vertebral body bone marrow mildly heterogeneous. No worrisome osseous lesions. No abnormal marrow edema. Cord: Signal intensity within the thoracic spinal cord is normal. Paraspinous soft tissues: Paraspinous soft tissues within normal limits. Visualized visceral structures grossly unremarkable. Probable small sebaceous cyst noted within the subcutaneous fat of the mid back. Disc levels: Minimal disc bulging noted at T4-5, T5-6, and T8-9. No significant stenosis. Posterior element hypertrophy at T10-11 with resultant mild to moderate left foraminal stenosis (series 22, image 15). No other significant foraminal encroachment. IMPRESSION: MRI HEAD IMPRESSION: 1. No acute intracranial process identified. 2. Moderately advanced cerebral atrophy for patient age. Otherwise unremarkable brain MRI. MRI CERVICAL SPINE IMPRESSION: 1. Large right paracentral disc protrusion at C5-6 with resultant moderate canal stenosis. Flattening of the right hemi cord without cord signal changes. 2. Broad posterior disc protrusion at C6-7 with mild to moderate canal stenosis. 3. Multifactorial degenerative changes with moderate to advanced bilateral C5 and C6 foraminal stenosis. MRI THORACIC SPINE IMPRESSION: 1. Normal MRI appearance of the thoracic spinal cord. No significant canal stenosis identified. 2. Mild degenerative disc bulging at T4-5, T5-6, and T8-9 without stenosis. 3. Left-sided posterior element hypertrophy at T10-11  with resultant mild to moderate left foraminal stenosis. Electronically Signed   By: Jeannine Boga M.D.   On: 09/10/2016 23:15        Scheduled Meds: . amLODipine  10 mg Oral Daily  . divalproex  1,500 mg Oral QHS  . folic acid  1 mg Oral Daily  . heparin  5,000 Units Subcutaneous Q8H  . insulin aspart  0-9 Units Subcutaneous TID WC  . LORazepam  0.5 mg Oral TID  . propranolol  20 mg Oral BID  . QUEtiapine  200 mg Oral QHS  . sodium chloride flush  3 mL Intravenous Q12H  . traZODone  100 mg Oral QHS   Continuous Infusions: . dextrose 5 % and 0.9 % NaCl with KCl 40 mEq/L 75 mL/hr at 09/11/16 0154     LOS: 4 days  Leonila Speranza Tanna Furry, MD [facility] Pager [phone]  If 7PM-7AM, please contact night-coverage [email or web address] Password [email or web address] 09/11/2016, 10:54 AM

## 2016-09-11 NOTE — Consult Note (Signed)
[facility] Consult   Reason for Consult:  Confusion, AMS Referring Physician:  Dr. Reesa Chew Patient Identification: GLEB MCGUIRE MRN:  [id number] Principal Diagnosis: Acute metabolic encephalopathy Diagnosis:   Patient Active Problem List   Diagnosis Date Noted  . Acute metabolic encephalopathy [I96.78] 09/07/2016  . HLD (hyperlipidemia) [E78.5] 09/07/2016  . GERD (gastroesophageal reflux disease) [K21.9] 09/07/2016  . PTSD (post-traumatic stress disorder) [F43.10] 09/07/2016  . Acute encephalopathy [G93.40] 09/07/2016  . Fall [W19.XXXA] 09/07/2016  . CRI (chronic renal insufficiency), stage 3 (moderate) [N18.3]   . Altered mental status [R41.82]   . Multiple drug overdose [T50.901A] 12/27/2015  . Suicidal overdose ([place]) [T50.902A] 12/26/2015  . Suicide attempt by multiple drug overdose ([place]) [T50.902A] 12/26/2015  . Ataxia [R27.0] 10/14/2015  . Slurring of speech [R47.81] 10/14/2015  . Leg weakness [R29.898] 10/03/2015  . CRF (chronic renal failure) [N18.9] 10/03/2015  . Hypokalemia [E87.6] 10/03/2015  . Hemiparesis ([place]) [G81.90]   . Seizures ([place]) [R56.9]   . Primary osteoarthritis of left hip [M16.12] 05/22/2015  . Exposure to potentially hazardous body fluids [Z77.21] 02/12/2015  . Loss of weight [R63.4] 09/04/2014  . Dyslipidemia [E78.5] 05/31/2014  . Left hip pain [M25.552] 02/13/2014  . Insomnia [G47.00] 11/11/2013  . Degenerative joint disease (DJD) of hip [M16.9] 08/16/2013  . Acute respiratory failure ([place]) [J96.00] 11/02/2012  . Pneumonia, organism unspecified(486) [J18.9] 11/02/2012  . Pleural effusion [J90] 11/02/2012  . Hypoxemia [R09.02] 11/02/2012  . HCAP (healthcare-associated pneumonia) [J18.9] 10/31/2012  . Anemia [D64.9] 10/31/2012  . Osteoarthritis of right hip [M16.11] 11/28/2011  . Right hip pain [M25.551] 07/22/2011  . Bilateral hip pain [M25.551, M25.552] 07/22/2011  . Low back pain [M54.5] 05/20/2011  . Testicular pain, right  [N50.811] 05/20/2011  . Erectile dysfunction [N52.9] 02/17/2011  . Constipation - functional [K59.04] 02/17/2011  . Diabetes type 2, uncontrolled ([place]) [E11.65] 11/06/2010  . Hypogonadism male [E29.1] 05/20/2010  . TOBACCO USER [F17.200] 05/20/2010  . Demoralization and apathy [R45.3] 05/20/2010  . Bipolar disorder ([place]) [F31.9] 01/18/2010  . Depression [F32.9] 01/18/2010  . MICROSCOPIC HEMATURIA [R31.29] 01/18/2010  . SHOULDER PAIN [M25.519] 01/18/2010  . Anxiety state [F41.1] 12/05/2006  . Essential hypertension [I10] 12/05/2006   Total Time spent with patient: 30 minutes  Subjective:   [name] is a 51 y.o. male patient with a  History of bipolar disorder, PTSD and prior substance used, admitted with AMS, chronic worsening over the ensuing months.  HPI:  Patient reports that "I am in a very dark place" and then trails off to talking about other topics.  He is quite tangential, touching on how often he falls, feeling lonely.  Mentions that "my sister thinks I'm crazy."  I asked him why he has mittens on, and he mentions that "I don't know I guess something in my teeth."  He continues on like this in conversation and seems quite frankly confused and distractible. He does agree that his energy, attention, memory are poor.  He asserts that he does feel suicidal, has no friends, and feels life isn't worth living. He agrees to psychiatric hospitalization.  Discussed with his sister, [name]: Sister reports that Rigley does live with she and husband. Sister reports that he has been diagnosed with bipolar disorder, social anxiety, depression, PTSD, substance abuse (in the past), 2 suicide attempts in 1 year ago and 5 years ago.  She is unsure of what medicines he actually takes at home.  No concerned about drug use.  They have noticed that over the  past 1 year, things have been worse in terms of confusion, but especially in the past 2-3 months his speech and confusion have dramatically worsened. He  falls all the time, and shakes frequently.  She has been worried about Parkinson's disease because his shaking and memory problems came on at the same time.  His memory has been poor; forgets to turn off the stove, turn off the oven, lock the door, close the garage, etc.  Family has been concerned about leaving him home alone.  Past Psychiatric History: 2 prior suicide attempts followed by psychiatric hospitalization  09/11/2016 Interval history: Patient seen, chart reviewed for this face-to-face psychiatric consultation follow-up. Patient is a poor historian and continued to be tangential and somewhat circumstantial. Patient has a disorganized thoughts and poor memory and attention spans. Patient has a history of posttraumatic stress disorder, bipolar disorder and history of substance abuse and admitted to the hospital with altered mental status. Patient cannot contract for safety during this hospitalization.  Risk to Self: Is patient at risk for suicide?: No Risk to Others:   Prior Inpatient Therapy:   Prior Outpatient Therapy:    Past Medical History:  Past Medical History:  Diagnosis Date  . ADD (attention deficit disorder)   . Anxiety   . Arthritis    right hip  . Bipolar 1 disorder ([place])   . Blood in urine   . CKD (chronic kidney disease), stage III   . Creatinine elevation   . Depression    bipolar [facility]  . Diabetes mellitus without complication ([place])   . Family history of anesthesia complication    pt is unsure , but pt father may have been difficult to arouse   . History of kidney stones   . Hypertension   . Hypogonadism male   . Liver fatty degeneration   . Microscopic hematuria    hereditary s/p Urology eval  . Pneumonia 10-2012  . Polysubstance dependence, non-opioid, in remission ([place])    remote  . PTSD (post-traumatic stress disorder)    SOCIAL ANXIETY DISORDER     Past Surgical History:  Procedure Laterality Date  . BACK SURGERY    . CLOSED REDUCTION  METACARPAL WITH PERCUTANEOUS PINNING Right   . LUMBAR DISC SURGERY    . TONSILLECTOMY    . TOTAL HIP ARTHROPLASTY Right 08/16/2013   Procedure: TOTAL HIP ARTHROPLASTY ANTERIOR APPROACH;  Surgeon: Hessie Dibble, MD;  Location: [facility];  Service: Orthopedics;  Laterality: Right;  . TOTAL HIP ARTHROPLASTY Left 05/22/2015   Procedure: TOTAL HIP ARTHROPLASTY ANTERIOR APPROACH;  Surgeon: Melrose Nakayama, MD;  Location: [facility];  Service: Orthopedics;  Laterality: Left;   Family History:  Family History  Problem Relation Age of Onset  . Diabetes Father   . Cancer Mother        died of melanoma with mets  . Cervical cancer Sister   . Diabetes Sister   . Other Neg Hx        hypogonadism   Family Psychiatric  History: Uncle with parkinsons, mom with bipolar disorder, maternal grandfather with bipolar and completed suicide   Social History:  History  Alcohol Use No     History  Drug Use No    Social History   Social History  . Marital status: Single    Spouse name: N/A  . Number of children: N/A  . Years of education: N/A   Occupational History  . [employer]   Social History Main  Topics  . Smoking status: Former Smoker    Years: 0.00    Types: Cigarettes    Quit date: 02/19/2014  . Smokeless tobacco: Never Used  . Alcohol use No  . Drug use: No  . Sexual activity: Not Currently   Other Topics Concern  . None   Social History Narrative   regualar exercise-no   Additional Social History:    Allergies:   Allergies  Allergen Reactions  . Vicodin [Hydrocodone-Acetaminophen] Itching    Labs:  Results for orders placed or performed during the hospital encounter of 09/07/16 (from the past 48 hour(s))  Glucose, capillary     Status: Abnormal   Collection Time: 09/09/16  1:13 PM  Result Value Ref Range   Glucose-Capillary 135 (H) 65 - 99 mg/dL   Comment 1 Notify RN    Comment 2 Document in Chart   Glucose, capillary     Status: Abnormal    Collection Time: 09/09/16  6:30 PM  Result Value Ref Range   Glucose-Capillary 128 (H) 65 - 99 mg/dL  Glucose, capillary     Status: Abnormal   Collection Time: 09/09/16  7:17 PM  Result Value Ref Range   Glucose-Capillary 140 (H) 65 - 99 mg/dL  Glucose, capillary     Status: Abnormal   Collection Time: 09/10/16  7:33 AM  Result Value Ref Range   Glucose-Capillary 121 (H) 65 - 99 mg/dL   Comment 1 Notify RN    Comment 2 Document in Chart   CBC     Status: Abnormal   Collection Time: 09/10/16  7:52 AM  Result Value Ref Range   WBC 5.3 4.0 - 10.5 K/uL   RBC 4.51 4.22 - 5.81 MIL/uL   Hemoglobin 13.6 13.0 - 17.0 g/dL   HCT 40.3 39.0 - 52.0 %   MCV 89.4 78.0 - 100.0 fL   MCH 30.2 26.0 - 34.0 pg   MCHC 33.7 30.0 - 36.0 g/dL   RDW 13.3 11.5 - 15.5 %   Platelets 81 (L) 150 - 400 K/uL    Comment: CONSISTENT WITH PREVIOUS RESULT  Basic metabolic panel     Status: Abnormal   Collection Time: 09/10/16  7:52 AM  Result Value Ref Range   Sodium 139 135 - 145 mmol/L   Potassium 3.8 3.5 - 5.1 mmol/L   Chloride 108 101 - 111 mmol/L   CO2 25 22 - 32 mmol/L   Glucose, Bld 126 (H) 65 - 99 mg/dL   BUN 21 (H) 6 - 20 mg/dL   Creatinine, Ser 1.99 (H) 0.61 - 1.24 mg/dL   Calcium 8.8 (L) 8.9 - 10.3 mg/dL   GFR calc non Af Amer 38 (L) >60 mL/min   GFR calc Af Amer 44 (L) >60 mL/min    Comment: (NOTE) The eGFR has been calculated using the CKD EPI equation. This calculation has not been validated in all clinical situations. eGFR's persistently <60 mL/min signify possible Chronic Kidney Disease.    Anion gap 6 5 - 15  Glucose, capillary     Status: Abnormal   Collection Time: 09/10/16 12:43 PM  Result Value Ref Range   Glucose-Capillary 112 (H) 65 - 99 mg/dL  Glucose, capillary     Status: Abnormal   Collection Time: 09/10/16  4:22 PM  Result Value Ref Range   Glucose-Capillary 138 (H) 65 - 99 mg/dL  Glucose, capillary     Status: Abnormal   Collection Time: 09/10/16 10:49 PM  Result Value  Ref  Range   Glucose-Capillary 127 (H) 65 - 99 mg/dL  Glucose, capillary     Status: Abnormal   Collection Time: 09/11/16  7:17 AM  Result Value Ref Range   Glucose-Capillary 123 (H) 65 - 99 mg/dL  Glucose, capillary     Status: Abnormal   Collection Time: 09/11/16 11:22 AM  Result Value Ref Range   Glucose-Capillary 112 (H) 65 - 99 mg/dL    Current Facility-Administered Medications  Medication Dose Route Frequency Provider Last Rate Last Dose  . amLODipine (NORVASC) tablet 10 mg  10 mg Oral Daily Cherene Altes, MD   10 mg at 09/11/16 1020  . dextrose 5 % and 0.9 % NaCl with KCl 40 mEq/L infusion   Intravenous Continuous Cherene Altes, MD 75 mL/hr at 09/11/16 0154    . divalproex (DEPAKOTE) DR tablet 1,500 mg  1,500 mg Oral QHS Ivor Costa, MD   1,500 mg at 09/10/16 2302  . enoxaparin (LOVENOX) injection 40 mg  40 mg Subcutaneous Q24H Rosita Fire, MD   40 mg at 09/11/16 1154  . folic acid (FOLVITE) tablet 1 mg  1 mg Oral Daily Amin, Ankit Chirag, MD   1 mg at 09/11/16 1020  . haloperidol lactate (HALDOL) injection 5 mg  5 mg Intravenous Q6H PRN Cherene Altes, MD   5 mg at 09/08/16 1556  . hydrALAZINE (APRESOLINE) injection 5 mg  5 mg Intravenous Q2H PRN Ivor Costa, MD      . insulin aspart (novoLOG) injection 0-9 Units  0-9 Units Subcutaneous TID WC Schorr, Rhetta Mura, NP   1 Units at 09/11/16 [phone]  . LORazepam (ATIVAN) injection 1-2 mg  1-2 mg Intravenous Q4H PRN Cherene Altes, MD   2 mg at 09/09/16 2136  . LORazepam (ATIVAN) tablet 0.5 mg  0.5 mg Oral TID Rosita Fire, MD   0.5 mg at 09/11/16 1020  . ondansetron (ZOFRAN) tablet 4 mg  4 mg Oral Q6H PRN Ivor Costa, MD       Or  . ondansetron [facility]) injection 4 mg  4 mg Intravenous Q6H PRN Ivor Costa, MD      . oxyCODONE-acetaminophen (PERCOCET/ROXICET) 5-325 MG per tablet 1-2 tablet  1-2 tablet Oral Q4H PRN Ivor Costa, MD      . propranolol (INDERAL) tablet 20 mg  20 mg Oral BID Ivor Costa, MD   20  mg at 09/10/16 2302  . QUEtiapine (SEROQUEL) tablet 200 mg  200 mg Oral QHS Amin, Ankit Chirag, MD   200 mg at 09/10/16 2302  . sodium chloride flush (NS) 0.9 % injection 3 mL  3 mL Intravenous Q12H Ivor Costa, MD   3 mL at 09/09/16 1000  . traZODone (DESYREL) tablet 100 mg  100 mg Oral QHS Amin, Ankit Chirag, MD   100 mg at 09/10/16 2301    Musculoskeletal: Strength & Muscle Tone: decreased Gait & Station: unsteady Patient leans: N/A  Psychiatric Specialty Exam: Physical Exam  ROS  Blood pressure 135/86, pulse (!) 56, temperature 97.2 F (36.2 C), temperature source Oral, resp. rate 11, height 6' 4"  (1.93 m), weight 94.4 kg (208 lb 1.8 oz), SpO2 96 %.Body mass index is 25.33 kg/m.  General Appearance: Bizarre and Disheveled  Eye Contact:  Fair  Speech:  Normal Rate and Slurred  Volume:  Decreased  Mood:  Depressed and Dysphoric  Affect:  Congruent  Thought Process:  Irrelevant  Orientation:  Full (Time, Place, and Person)  Thought Content:  Illogical  Suicidal Thoughts:  Yes.  with intent/plan  Homicidal Thoughts:  No  Memory:  Immediate;   Poor  Judgement:  Impaired  Insight:  Shallow  Psychomotor Activity:  Tremor  Concentration:  Concentration: Poor  Recall:  Poor  Fund of Knowledge:  Poor  Language:  Poor  Akathisia:  Negative  Handed:  Right  AIMS (if indicated):     Assets:  Housing Social Support  ADL's:  Intact  Cognition:  WNL  Sleep:       Treatment Plan Summary: DAVIEON STOCKHAM is a 51 year old male with multiple psychiatric diagnoses, who has had a worsening decline in function over the past year including increased falls, tremors, memory loss, and speech difficulties.  Family is quite concerned about having him at home, for fear that he would harm himself accidentally or on purpose.  Family is unsure of his med regimen and patient is an unreliable source given his AMS.   Recommend physical therapy evaluation to evaluate for adult daily living functions and  mobility and stability before seeking inpatient psychiatric hospitalization.  Continue Seroquel to 200 mg QHS Continue Trazodone to 100 mg QHS Continue Lorazepam 1 mg TID and taper by 0.5 mg over the next 6 days Continue Depakote 1500 mg QHS, monitor for the valproic acid level Agrees with DISCONTINUE Clonazepam, Wellbutrin, Ambien and Cogentin Please check Ammonia if not already checked Appreciate neurology consult and further neuromuscular workup.  Coordinate with SW for psychiatric admission once medically cleared   Disposition: Recommend psychiatric Inpatient admission when medically cleared.  Ambrose Finland, MD 09/11/2016 12:08 PM

## 2016-09-11 NOTE — Progress Notes (Signed)
Patient and patient's sister expressed concerns about patient's inpatient pysch placement at [facility]. Patient's sister stated that in his last admission to [facility] it was not a positive environment for the patient and she was worried that it did more harm than good. RN told patient and patient's sister that she would relay the concerns to CSW. Patient's sister expressed that any medical staff call her with any questions or information regarding her brother's care.   Basil Dess, RN

## 2016-09-12 LAB — GLUCOSE, CAPILLARY
GLUCOSE-CAPILLARY: 136 mg/dL — AB (ref 65–99)
GLUCOSE-CAPILLARY: 155 mg/dL — AB (ref 65–99)
GLUCOSE-CAPILLARY: 122 mg/dL — AB (ref 65–99)
Glucose-Capillary: 166 mg/dL — ABNORMAL HIGH (ref 65–99)

## 2016-09-12 LAB — IMMUNOFIXATION ELECTROPHORESIS
IGA: 237 mg/dL (ref 90–386)
IGG (IMMUNOGLOBIN G), SERUM: 981 mg/dL (ref 700–1600)
IGM, SERUM: 92 mg/dL (ref 20–172)
Total Protein ELP: 6.4 g/dL (ref 6.0–8.5)

## 2016-09-12 MED ORDER — TAMSULOSIN HCL 0.4 MG PO CAPS
0.4000 mg | ORAL_CAPSULE | Freq: Every day | ORAL | Status: DC
  Administered 2016-09-12 – 2016-10-12 (×31): 0.4 mg via ORAL
  Filled 2016-09-12 (×31): qty 1

## 2016-09-12 NOTE — Evaluation (Signed)
Occupational Therapy Evaluation Patient Details Name: [name] MRN: [id number] DOB: 08/15/1965 Today's Date: 09/12/2016    History of Present Illness 51 y.o.malewith history significant of PTSD, bipolar disorder, ADD, depression, anxiety, GERD, CKD stage III, seizure d/o, HTN, HLD, and DM who presentedwith altered mental status. The patient's sister reported he had been cognitively declining for the past year w/ multiple falls at home. Non - compliant w/ his medications per family, more confused and restless, reporting auditory and visual hallucinations. CT head in the ED was w/o acute findings.    Clinical Impression   Pt currently presents with increased confusion, decreased memory, decreased balance as it relates to selfcare performance.  Overall he needs mod assist for LB selfcare sit to stand as well as for simulated toileting and toilet transfers.  Recommend acute care OT for further rehab to help progress ADL independence.  Feel he will need 24 hour supervision/assist at discharge.  Recommend SNF for post acute rehab as well.      Follow Up Recommendations  SNF;Supervision/Assistance - 24 hour    Equipment Recommendations  Other (comment) (TBD next venue of care)       Precautions / Restrictions Precautions Precautions: Fall Restrictions Weight Bearing Restrictions: No      Mobility Bed Mobility Overal bed mobility: Needs Assistance Bed Mobility: Supine to Sit     Supine to sit: Max assist     General bed mobility comments: assistance for legs and trunk, pushes posteriorly when  attempting to come forward with trunk.   Transfers Overall transfer level: Needs assistance Equipment used: 1 person hand held assist Transfers: Sit to/from Stand Sit to Stand: Mod assist         General transfer comment: patient pushes legs against bed to stand, constantly pushing backward and to the right.     Balance Overall balance assessment: History of Falls;Needs  assistance Sitting-balance support: No upper extremity supported;Feet unsupported Sitting balance-Leahy Scale: Poor Sitting balance - Comments: Posterior and left LOB when attempting to donn socks in sitting. Postural control: Posterior lean;Left lateral lean Standing balance support: No upper extremity supported Standing balance-Leahy Scale: Poor Standing balance comment: tends to lean to left  more so and posteriorlt                           ADL either performed or assessed with clinical judgement   ADL Overall ADL's : Needs assistance/impaired Eating/Feeding: Independent   Grooming: Wash/dry hands;Minimal assistance;Standing   Upper Body Bathing: Supervision/ safety;Sitting   Lower Body Bathing: Sit to/from stand;Minimal assistance   Upper Body Dressing : Supervision/safety;Sitting   Lower Body Dressing: Moderate assistance;Sit to/from stand   Toilet Transfer: Moderate assistance;Ambulation;RW;Stand-pivot   Toileting- Water quality scientist and Hygiene: Moderate assistance;Sit to/from stand       Functional mobility during ADLs: Moderate assistance General ADL Comments: Pt with frequent LOB posteriorly and to the left when sitting in bedside chair.  Increased lean and LOB to the left as well in standing and during mobility.  Pt with short step length and slow stride.  He reports that he is usually faster than this when using his cane.       Vision Baseline Vision/History: No visual deficits Patient Visual Report: No change from baseline Vision Assessment?: No apparent visual deficits            Pertinent Vitals/Pain Pain Assessment: 0-10 Pain Score: 8  Pain Location: right shoulder and trunk  Pain Descriptors /  Indicators: Discomfort;Aching Pain Intervention(s): Monitored during session     Hand Dominance Right   Extremity/Trunk Assessment Upper Extremity Assessment Upper Extremity Assessment: Overall WFL for tasks assessed   Lower Extremity  Assessment Lower Extremity Assessment: Defer to PT evaluation RLE Deficits / Details: noted shaking of the legs with efforts to stand. able to bear weight in standing, difficulty flexing the hip and knee while supine.  LLE Deficits / Details: similar to  right   Cervical / Trunk Assessment Cervical / Trunk Assessment: Normal Cervical / Trunk Exceptions: constantly posterior lean    Communication Communication Communication: Other (comment) (at times some words were not comprehensible or not accurate to context.)   Cognition Arousal/Alertness: Awake/alert Behavior During Therapy: Anxious Overall Cognitive Status: Impaired/Different from baseline Area of Impairment: Orientation;Following commands;Safety/judgement;Awareness;Problem solving                 Orientation Level: Time;Situation     Following Commands: Follows one step commands consistently Safety/Judgement: Decreased awareness of safety;Decreased awareness of deficits Awareness: Intellectual Problem Solving: Slow processing;Requires verbal cues General Comments: Pt with difficulty answering questions directly and would deviate with answer from original question.                Home Living Family/patient expects to be discharged to:: Private residence Living Arrangements: Other relatives (lives with sister) Available Help at Discharge: Available PRN/intermittently Type of Home: House Home Access: Stairs to enter [employer] of Steps: 2 steps in the garage and 2 in the front Entrance Stairs-Rails: None Home Layout: Bed/bath upstairs;1/2 bath on main level     Bathroom Shower/Tub: Tub/shower unit;Curtain   Bathroom Toilet: Standard     Home Equipment: Cane - single point;Walker - 2 wheels;Bedside commode   Additional Comments: Lives with sister and her family.      Prior Functioning/Environment Level of Independence: Independent        Comments: used cane at times for mobility as well as  the walker        OT Problem List: Decreased strength;Impaired balance (sitting and/or standing);Decreased cognition;Decreased safety awareness      OT Treatment/Interventions: Self-care/ADL training;DME and/or AE instruction;Therapeutic activities;Balance training;Patient/family education    OT Goals(Current goals can be found in the care plan section) Acute Rehab OT Goals Patient Stated Goal: Pt wants to get better with his balance.  OT Goal Formulation: With patient Time For Goal Achievement: 09/12/16 Potential to Achieve Goals: Fair  OT Frequency: Min 2X/week   Barriers to D/C: Decreased caregiver support  his sister works and is not home 24 hour                     AM-PAC PT "6 Clicks" Daily Activity     Outcome Measure Help from another person eating meals?: None Help from another person taking care of personal grooming?: A Little Help from another person toileting, which includes using toliet, bedpan, or urinal?: A Lot Help from another person bathing (including washing, rinsing, drying)?: A Lot Help from another person to put on and taking off regular upper body clothing?: A Little Help from another person to put on and taking off regular lower body clothing?: A Lot 6 Click Score: 16   End of Session Nurse Communication: Mobility status  Activity Tolerance: Patient tolerated treatment well Patient left: in chair;with call bell/phone within reach;with nursing/sitter in room  OT Visit Diagnosis: Unsteadiness on feet (R26.81);Other abnormalities of gait and mobility (R26.89);Repeated falls (R29.6);Muscle weakness (generalized) (M62.81);History of falling (  Z91.81);Other symptoms and signs involving cognitive function                Time: 9753-0051 OT Time Calculation (min): 42 min Charges:  OT General Charges $OT Visit: 1 Procedure OT Evaluation $OT Eval Moderate Complexity: 1 Procedure OT Treatments $Self Care/Home Management : 23-37 mins   Aliza Moret  OTR/L 09/12/2016, 4:12 PM

## 2016-09-12 NOTE — Evaluation (Signed)
Physical Therapy Evaluation Patient Details Name: MAVERYK Gray MRN: [id number] DOB: Dec 23, 1965 Today's Date: 09/12/2016   History of Present Illness  51 y.o.malewith history significant of PTSD, bipolar disorder, ADD, depression, anxiety, GERD, CKD stage III, seizure d/o, HTN, HLD, and DM who presentedwith altered mental status. The patient's sister reported he had been cognitively declining for the past year w/ multiple falls at home. Non - compliant w/ his medications per family, more confused and restless, reporting auditory and visual hallucinations. CT head in the ED was w/o acute findings.   Clinical Impression  The patient  Presents with decreased cognition, ataxic gait, leans posteriorly and to the right in sitting and standing. Currently requires assistance of 2 to ambulate a short distance. Pt admitted with above diagnosis. Pt currently with functional limitations due to the deficits listed below (see PT Problem List).  Pt will benefit from skilled PT to increase their independence and safety with mobility to allow discharge to the venue listed below.       Follow Up Recommendations SNF;Supervision/Assistance - 24 hour    Equipment Recommendations  Other (comment) (TBD)    Recommendations for Other Services       Precautions / Restrictions Precautions Precautions: Fall Restrictions Weight Bearing Restrictions: No      Mobility  Bed Mobility Overal bed mobility: Needs Assistance Bed Mobility: Supine to Sit     Supine to sit: Max assist     General bed mobility comments: assistance for legs and trunk, pushes posteriorly when  attempting to come forward with trunk.   Transfers Overall transfer level: Needs assistance Equipment used: Rolling walker (2 wheeled) Transfers: Sit to/from Stand Sit to Stand: Mod assist;+2 safety/equipment;From elevated surface         General transfer comment: patient pushes legs against bed to stand, constantly pushing backward  and to the right.   Ambulation/Gait Ambulation/Gait assistance: Mod assist;+2 safety/equipment;+2 physical assistance Ambulation Distance (Feet): 10 Feet Assistive device: Rolling walker (2 wheeled) Gait Pattern/deviations: Step-to pattern;Staggering left;Staggering right;Decreased weight shift to right;Scissoring;Ataxic;Festinating     General Gait Details: the patient had much difficulty maintianing upright posture, leaning posterior, RW coming off of floor. Noted decreased coordination of  stepping, crossing over and required constant support to prevent falling backwards.  Multimodal cues for use of RW and for turning and sitting down to recliner- decreased control of descent.  Stairs            Wheelchair Mobility    Modified Rankin (Stroke Patients Only)       Balance Overall balance assessment: History of Falls;Needs assistance Sitting-balance support: Bilateral upper extremity supported;Feet supported Sitting balance-Leahy Scale: Poor Sitting balance - Comments: decreased ability to sit in midline, posterior leaning Postural control: Posterior lean;Left lateral lean Standing balance support: During functional activity;Bilateral upper extremity supported Standing balance-Leahy Scale: Zero Standing balance comment: tends to lean to left  more so and posteriorlt                             Pertinent Vitals/Pain Pain Assessment: 0-10 Pain Score: 8  Pain Location: right shoulder and trunk  Pain Descriptors / Indicators: Discomfort;Aching Pain Intervention(s): Monitored during session    Home Living Family/patient expects to be discharged to:: Private residence Living Arrangements: Other relatives (lives with sister) Available Help at Discharge: Available PRN/intermittently Type of Home: House Home Access: Stairs to enter Entrance Stairs-Rails: None Entrance Stairs-Number of Steps: 2 steps in the garage  and 2 in the front Home Layout: Bed/bath  upstairs;1/2 bath on main level Home Equipment: Cane - single point;Walker - 2 wheels;Bedside commode Additional Comments: Lives with sister and her family.    Prior Function Level of Independence: Independent         Comments: used cane at times for mobility as well as the walker     Hand Dominance   Dominant Hand: Right    Extremity/Trunk Assessment   Upper Extremity Assessment Upper Extremity Assessment: Overall WFL for tasks assessed    Lower Extremity Assessment Lower Extremity Assessment: Defer to PT evaluation RLE Deficits / Details: noted shaking of the legs with efforts to stand. able to bear weight in standing, difficulty flexing the hip and knee while supine.  LLE Deficits / Details: similar to  right    Cervical / Trunk Assessment Cervical / Trunk Assessment: Normal Cervical / Trunk Exceptions: constantly posterior lean   Communication   Communication: Other (comment) (at times some words were not comprehensible or not accurate to context.)  Cognition Arousal/Alertness: Awake/alert Behavior During Therapy: Anxious Overall Cognitive Status: Impaired/Different from baseline Area of Impairment: Orientation;Following commands;Safety/judgement;Awareness;Problem solving                 Orientation Level: Time;Situation     Following Commands: Follows one step commands consistently Safety/Judgement: Decreased awareness of safety;Decreased awareness of deficits Awareness: Intellectual Problem Solving: Slow processing;Requires verbal cues General Comments: Pt with difficulty answering questions directly and would deviate with answer from original question.        General Comments      Exercises     Assessment/Plan    PT Assessment Patient needs continued PT services  PT Problem List Decreased range of motion;Decreased strength;Decreased activity tolerance;Decreased balance;Decreased mobility;Decreased coordination;Decreased cognition;Decreased  knowledge of use of DME;Decreased safety awareness;Decreased knowledge of precautions;Pain       PT Treatment Interventions DME instruction;Gait training;Functional mobility training;Therapeutic activities;Therapeutic exercise;Balance training;Patient/family education    PT Goals (Current goals can be found in the Care Plan section)  Acute Rehab PT Goals Patient Stated Goal: wants to walk without falling PT Goal Formulation: Patient unable to participate in goal setting Time For Goal Achievement: 09/26/16 Potential to Achieve Goals: Fair    Frequency Min 3X/week   Barriers to discharge Decreased caregiver support;Inaccessible home environment      Co-evaluation               AM-PAC PT "6 Clicks" Daily Activity  Outcome Measure Difficulty turning over in bed (including adjusting bedclothes, sheets and blankets)?: A Lot Difficulty moving from lying on back to sitting on the side of the bed? : A Lot Difficulty sitting down on and standing up from a chair with arms (e.g., wheelchair, bedside commode, etc,.)?: Total Help needed moving to and from a bed to chair (including a wheelchair)?: Total Help needed walking in hospital room?: Total Help needed climbing 3-5 steps with a railing? : Total 6 Click Score: 8    End of Session Equipment Utilized During Treatment: Gait belt Activity Tolerance: Patient tolerated treatment well Patient left: in chair;with call bell/phone within reach;with nursing/sitter in room Nurse Communication: Mobility status PT Visit Diagnosis: Repeated falls (R29.6);Ataxic gait (R26.0);Difficulty in walking, not elsewhere classified (R26.2)    Time: 1337-1410 PT Time Calculation (min) (ACUTE ONLY): 33 min   Charges:   PT Evaluation $PT Eval Moderate Complexity: 1 Procedure PT Treatments $Gait Training: 8-22 mins   PT G Codes:  [place] PT [id number]  Claretha Cooper 09/12/2016, 4:01 PM

## 2016-09-12 NOTE — Progress Notes (Signed)
Clinical Social Worker spoke with Mendel Ryder from [employer] in regards to patients referral to the facility. Mendel Ryder stated that at this time [facility]  will not be able to take patient because "he is to medically complex for the facility".   Rhea Pink, MSW,  [place]

## 2016-09-12 NOTE — Progress Notes (Signed)
PROGRESS NOTE    [name]  TDV:[id number] DOB: [birth year] DOA: 09/07/2016 PCP: Beverly Sessions   Brief Narrative: 51 y.o.malewith history significant of PTSD, bipolar disorder, ADD, depression, anxiety, GERD, CKD stage III, seizure d/o, HTN, HLD, and DM who presentedwith altered mental status. The patient's sister reported he had been cognitively declining for the past year w/ multiple falls at home. He has not been compliant w/ his medications. He had recentlybecome more confused and restless, reporting auditory and visual hallucinations. CT head in the ED was w/o acute findings.   Assessment & Plan:   # Altered mental status likely acute metabolic encephalopathy versus medication related versus chronic cognitive decline: CT scan of head showed mildly advanced cerebral atrophy with minimal chronic ischemic changes. No acute finding. -TSH, vitamin G62, folic acid level acceptable. HIV and RPR nonreactive. -Already evaluated by psychiatrist and adjusted medications. They recommended neurology consult. Evaluated by neurologist and obtained MRI of the brain and spine with no acute finding. Neurologist discontinued Truvada as there is no clear indication for this medication. Mental status seems gradually improving. -No signs of infection noted at this time -Continue supportive care. Discontinue Foley catheter and do bladder scan.  # Acute kidney injury on CKD stage III -Serum creatinine level is stable. Avoid nephrotoxins. Monitor urine output.  # Acute urinary retention: Bladder scan with 800 mL of urine. The Foley was discontinued few days ago. Ordered Flomax. We'll place and Foley catheter until patient's activity increases. Discussed with the nurse.  # PTSD/anxiety/attention deficit disorder/bipolar disorde, Suicidal ideation -Medications adjusted by psychiatrist. Continue suicide precautions.  Psychiatrist recommended inpatient psych admission. Patient is medically stable to transfer his  care to inpatient psych. He still has low mood. Social worker following for inpatient rehabilitation discharge. Continue current management. -Taper Ativan 0.5 three times a day. -Transfer to regular medical floor today onto the waiting for outpatient psych bed.  #History of seizure disorder -Continue Depakote. Ativan as needed. No seizure-like activity. Neurology consult appreciated.  # Hypertension -Continue Norvasc and propranolol. Monitor blood pressure and heart rate.  # Hypokalemia: Improved. Magnesium level acceptable.  #Chronic thrombocytopenia: No sign of bleeding. Monitor platelet counts.   Principal Problem:   Acute metabolic encephalopathy Active Problems:   Bipolar disorder ([place])   Anxiety state   Depression   Essential hypertension   Diabetes type 2, uncontrolled ([facility])   Hypokalemia   Seizures (HCC)   HLD (hyperlipidemia)   PTSD (post-traumatic stress disorder)   Acute encephalopathy   CRI (chronic renal insufficiency), stage 3 (moderate)   Fall  DVT prophylaxis: Lovenox subcutaneous Code Status: Full code Family Communication: No family at bedside Disposition Plan: Likely discharge to inpatient psych in 1-2 days when bed is available. Transferred to regular floor today.    Consultants:   Psychiatrist  Procedures: None Antimicrobials: None  Subjective: Seen and examined at bedside. Looks more alert awake and following commands. Still has low mood and part of suicidal ideation. Denied chest pain or shortness of breath.  Objective: Vitals:   09/11/16 2050 09/12/16 0345 09/12/16 0725 09/12/16 0729  BP: 126/66 126/81 (!) 128/92 126/74  Pulse: 64 (!) 58 (!) 52   Resp: (!) 21 (!) 22 11   Temp: 98.1 F (36.7 C) 97.8 F (36.6 C) 97.8 F (36.6 C)   TempSrc: Oral Axillary Oral   SpO2: 94% 93% 92%   Weight:      Height:        Intake/Output Summary (Last 24 hours) at 09/12/16 1110  Last data filed at 09/12/16 0900  Gross per 24 hour  Intake            1387.5 ml  Output             1200 ml  Net            187.5 ml   Filed Weights   09/10/16 1029  Weight: 94.4 kg (208 lb 1.8 oz)    Examination:  General exam: Alert awake not in distress  Respiratory system: Clear to clear bilateral. Respiratory effort normal. No wheezing or crackle Cardiovascular system: S1 & S2 heard, RRR.  No pedal edema. Gastrointestinal system: Abdomen is nondistended, soft and nontender. Normal bowel sounds heard. Central nervous system: Following commands, oriented, nonfocal Extremities: Symmetric 5 x 5 power. Skin: No rashes, lesions or ulcers Psychiatry: Depressed mood. Intermittent suicidal ideation  Data Reviewed: I have personally reviewed following labs and imaging studies  CBC:  Recent Labs Lab 09/07/16 1734 09/08/16 0220 09/09/16 0416 09/10/16 0752  WBC 6.1 5.5 9.2 5.3  NEUTROABS 4.3  --   --   --   HGB 14.1 12.9* 15.2 13.6  HCT 39.5 37.3* 43.4 40.3  MCV 86.6 87.1 88.0 89.4  PLT 103* 88* 105* 81*   Basic Metabolic Panel:  Recent Labs Lab 09/07/16 1734 09/08/16 0220 09/09/16 0416 09/10/16 0752  NA 135 138 139 139  K 3.4* 3.3* 3.2* 3.8  CL 103 107 104 108  CO2 23 23 25 25   GLUCOSE 144* 96 101* 126*  BUN 22* 16 15 21*  CREATININE 2.19* 1.82* 1.96* 1.99*  CALCIUM 9.2 8.6* 9.6 8.8*  MG  --   --  1.9  --   PHOS  --   --  2.3*  --    GFR: Estimated Creatinine Clearance: 55.7 mL/min (A) (by C-G formula based on SCr of 1.99 mg/dL (H)). Liver Function Tests:  Recent Labs Lab 09/07/16 1734  AST 46*  ALT 30  ALKPHOS 106  BILITOT 1.1  PROT 6.8  ALBUMIN 4.0   No results for input(s): LIPASE, AMYLASE in the last 168 hours.  Recent Labs Lab 09/07/16 1734  AMMONIA 43*   Coagulation Profile:  Recent Labs Lab 09/07/16 1734  INR 1.09   Cardiac Enzymes:  Recent Labs Lab 09/07/16 1733  TROPONINI <0.03   BNP (last 3 results) No results for input(s): PROBNP in the last 8760 hours. HbA1C: No results for input(s):  HGBA1C in the last 72 hours. CBG:  Recent Labs Lab 09/11/16 0717 09/11/16 1122 09/11/16 1730 09/11/16 2224 09/12/16 0756  GLUCAP 123* 112* 136* 149* 136*   Lipid Profile: No results for input(s): CHOL, HDL, LDLCALC, TRIG, CHOLHDL, LDLDIRECT in the last 72 hours. Thyroid Function Tests: No results for input(s): TSH, T4TOTAL, FREET4, T3FREE, THYROIDAB in the last 72 hours. Anemia Panel: No results for input(s): VITAMINB12, FOLATE, FERRITIN, TIBC, IRON, RETICCTPCT in the last 72 hours. Sepsis Labs: No results for input(s): PROCALCITON, LATICACIDVEN in the last 168 hours.  Recent Results (from the past 240 hour(s))  MRSA PCR Screening     Status: None   Collection Time: 09/08/16  3:22 AM  Result Value Ref Range Status   MRSA by PCR NEGATIVE NEGATIVE Final    Comment:        The GeneXpert MRSA Assay (FDA approved for NASAL specimens only), is one component of a comprehensive MRSA colonization surveillance program. It is not intended to diagnose MRSA infection nor to guide or monitor treatment for  MRSA infections.          Radiology Studies: Mr Brain Wo Contrast  Result Date: 09/10/2016 CLINICAL DATA:  Initial evaluation for probable multifactorial gait disturbance. EXAM: MRI HEAD WITHOUT CONTRAST MRI CERVICAL SPINE WITHOUT CONTRAST MRI THORACIC SPINE WITHOUT CONTRAST TECHNIQUE: Multiplanar, multiecho pulse sequences of the brain and surrounding structures, and cervical spine, to include the craniocervical junction and cervicothoracic junction, and thoracic spine were obtained without intravenous contrast. COMPARISON:  None available. FINDINGS: MRI HEAD FINDINGS Brain: Study moderately degraded by motion artifact. Diffuse prominence of the CSF containing spaces compatible with generalized cerebral atrophy, advanced for age. No focal parenchymal signal abnormality identified. No significant cerebral white matter disease. No evidence for acute infarct. Gray-white matter  differentiation maintained. No encephalomalacia to suggest chronic infarction. No evidence for acute or chronic intracranial hemorrhage. No mass lesion, midline shift or mass effect. Mild ventricular prominence related to global parenchymal volume loss of hydrocephalus. No extra-axial fluid collection. Major dural sinuses are grossly patent. Pituitary gland suprasellar region within normal limits. Midline structures intact and normal. Vascular: Major intracranial vascular flow voids maintained. Left vertebral artery dominant and tortuous, invaginating upon the left aspect of the medulla. Skull and upper cervical spine: Craniocervical junction within normal limits. Bone marrow signal intensity normal. No scalp soft tissue abnormality. Sinuses/Orbits: Globes and orbital soft tissues within normal limits. Paranasal sinuses are clear. No mastoid effusion. Inner ear structures normal. Other: None. MRI CERVICAL SPINE FINDINGS Alignment: Study moderately degraded by motion artifact. Vertebral bodies normally aligned with preservation of the normal cervical lordosis. No listhesis. Vertebrae: Vertebral body heights maintained. No evidence for acute or chronic fracture. Signal intensity within the vertebral body bone marrow somewhat heterogeneous. T1/T2 hyperintensity within the inferior aspect of the C7 vertebral body likely likely reflects a benign hemangioma. No worrisome osseous lesions. No abnormal marrow edema. Cord: Signal intensity within the cervical spinal cord is normal. Posterior Fossa, vertebral arteries, paraspinal tissues: Paraspinous and prevertebral soft tissues within normal limits. Normal intravascular flow voids present within the dominant left vertebral artery. Right vertebral artery not well visualized. Disc levels: C2-C3: Unremarkable. C3-C4: Mild degenerative disc bulge with uncovertebral hypertrophy. No significant canal stenosis. Foramina are grossly patent. C4-C5: Diffuse degenerative disc bulge  with bilateral uncovertebral spurring. Broad posterior disc osteophyte mildly flattens the ventral thecal sac without significant canal stenosis. Moderate bilateral C5 foraminal stenosis. C5-C6: Chronic diffuse degenerative disc osteophyte with intervertebral disc space narrowing. Superimposed right paracentral disc protrusion indents the right ventral thecal sac (series 14, image 21). Flattening of the right hemi cord without cord signal changes. Moderate canal stenosis. Cephalad and caudad migration of disc material posterior to the C5 and C6 vertebral bodies (series 18, image 21 on axial, series 16, image 8 on sagittal). Moderate to severe bilateral C6 foraminal stenosis. C6-C7: Broad posterior disc protrusion flattens and partially faces the ventral thecal sac. Probable ligamentum flavum thickening. Resultant mild to moderate canal stenosis. No cord flattening or cord deformity identified on this motion degraded exam. No obvious foraminal encroachment. C7-T1:  Unremarkable. MRI THORACIC SPINE FINDINGS Alignment:  Study moderately degraded by motion artifact. Vertebral bodies normally aligned with preservation of the normal thoracic kyphosis. No listhesis. Vertebrae: Vertebral body heights maintained. No evidence for acute or chronic fracture. Signal intensity within the vertebral body bone marrow mildly heterogeneous. No worrisome osseous lesions. No abnormal marrow edema. Cord: Signal intensity within the thoracic spinal cord is normal. Paraspinous soft tissues: Paraspinous soft tissues within normal limits. Visualized visceral structures grossly  unremarkable. Probable small sebaceous cyst noted within the subcutaneous fat of the mid back. Disc levels: Minimal disc bulging noted at T4-5, T5-6, and T8-9. No significant stenosis. Posterior element hypertrophy at T10-11 with resultant mild to moderate left foraminal stenosis (series 22, image 15). No other significant foraminal encroachment. IMPRESSION: MRI HEAD  IMPRESSION: 1. No acute intracranial process identified. 2. Moderately advanced cerebral atrophy for patient age. Otherwise unremarkable brain MRI. MRI CERVICAL SPINE IMPRESSION: 1. Large right paracentral disc protrusion at C5-6 with resultant moderate canal stenosis. Flattening of the right hemi cord without cord signal changes. 2. Broad posterior disc protrusion at C6-7 with mild to moderate canal stenosis. 3. Multifactorial degenerative changes with moderate to advanced bilateral C5 and C6 foraminal stenosis. MRI THORACIC SPINE IMPRESSION: 1. Normal MRI appearance of the thoracic spinal cord. No significant canal stenosis identified. 2. Mild degenerative disc bulging at T4-5, T5-6, and T8-9 without stenosis. 3. Left-sided posterior element hypertrophy at T10-11 with resultant mild to moderate left foraminal stenosis. Electronically Signed   By: Jeannine Boga M.D.   On: 09/10/2016 23:15   Mr Cervical Spine Wo Contrast  Result Date: 09/10/2016 CLINICAL DATA:  Initial evaluation for probable multifactorial gait disturbance. EXAM: MRI HEAD WITHOUT CONTRAST MRI CERVICAL SPINE WITHOUT CONTRAST MRI THORACIC SPINE WITHOUT CONTRAST TECHNIQUE: Multiplanar, multiecho pulse sequences of the brain and surrounding structures, and cervical spine, to include the craniocervical junction and cervicothoracic junction, and thoracic spine were obtained without intravenous contrast. COMPARISON:  None available. FINDINGS: MRI HEAD FINDINGS Brain: Study moderately degraded by motion artifact. Diffuse prominence of the CSF containing spaces compatible with generalized cerebral atrophy, advanced for age. No focal parenchymal signal abnormality identified. No significant cerebral white matter disease. No evidence for acute infarct. Gray-white matter differentiation maintained. No encephalomalacia to suggest chronic infarction. No evidence for acute or chronic intracranial hemorrhage. No mass lesion, midline shift or mass  effect. Mild ventricular prominence related to global parenchymal volume loss of hydrocephalus. No extra-axial fluid collection. Major dural sinuses are grossly patent. Pituitary gland suprasellar region within normal limits. Midline structures intact and normal. Vascular: Major intracranial vascular flow voids maintained. Left vertebral artery dominant and tortuous, invaginating upon the left aspect of the medulla. Skull and upper cervical spine: Craniocervical junction within normal limits. Bone marrow signal intensity normal. No scalp soft tissue abnormality. Sinuses/Orbits: Globes and orbital soft tissues within normal limits. Paranasal sinuses are clear. No mastoid effusion. Inner ear structures normal. Other: None. MRI CERVICAL SPINE FINDINGS Alignment: Study moderately degraded by motion artifact. Vertebral bodies normally aligned with preservation of the normal cervical lordosis. No listhesis. Vertebrae: Vertebral body heights maintained. No evidence for acute or chronic fracture. Signal intensity within the vertebral body bone marrow somewhat heterogeneous. T1/T2 hyperintensity within the inferior aspect of the C7 vertebral body likely likely reflects a benign hemangioma. No worrisome osseous lesions. No abnormal marrow edema. Cord: Signal intensity within the cervical spinal cord is normal. Posterior Fossa, vertebral arteries, paraspinal tissues: Paraspinous and prevertebral soft tissues within normal limits. Normal intravascular flow voids present within the dominant left vertebral artery. Right vertebral artery not well visualized. Disc levels: C2-C3: Unremarkable. C3-C4: Mild degenerative disc bulge with uncovertebral hypertrophy. No significant canal stenosis. Foramina are grossly patent. C4-C5: Diffuse degenerative disc bulge with bilateral uncovertebral spurring. Broad posterior disc osteophyte mildly flattens the ventral thecal sac without significant canal stenosis. Moderate bilateral C5 foraminal  stenosis. C5-C6: Chronic diffuse degenerative disc osteophyte with intervertebral disc space narrowing. Superimposed right paracentral disc protrusion indents  the right ventral thecal sac (series 14, image 21). Flattening of the right hemi cord without cord signal changes. Moderate canal stenosis. Cephalad and caudad migration of disc material posterior to the C5 and C6 vertebral bodies (series 18, image 21 on axial, series 16, image 8 on sagittal). Moderate to severe bilateral C6 foraminal stenosis. C6-C7: Broad posterior disc protrusion flattens and partially faces the ventral thecal sac. Probable ligamentum flavum thickening. Resultant mild to moderate canal stenosis. No cord flattening or cord deformity identified on this motion degraded exam. No obvious foraminal encroachment. C7-T1:  Unremarkable. MRI THORACIC SPINE FINDINGS Alignment:  Study moderately degraded by motion artifact. Vertebral bodies normally aligned with preservation of the normal thoracic kyphosis. No listhesis. Vertebrae: Vertebral body heights maintained. No evidence for acute or chronic fracture. Signal intensity within the vertebral body bone marrow mildly heterogeneous. No worrisome osseous lesions. No abnormal marrow edema. Cord: Signal intensity within the thoracic spinal cord is normal. Paraspinous soft tissues: Paraspinous soft tissues within normal limits. Visualized visceral structures grossly unremarkable. Probable small sebaceous cyst noted within the subcutaneous fat of the mid back. Disc levels: Minimal disc bulging noted at T4-5, T5-6, and T8-9. No significant stenosis. Posterior element hypertrophy at T10-11 with resultant mild to moderate left foraminal stenosis (series 22, image 15). No other significant foraminal encroachment. IMPRESSION: MRI HEAD IMPRESSION: 1. No acute intracranial process identified. 2. Moderately advanced cerebral atrophy for patient age. Otherwise unremarkable brain MRI. MRI CERVICAL SPINE IMPRESSION:  1. Large right paracentral disc protrusion at C5-6 with resultant moderate canal stenosis. Flattening of the right hemi cord without cord signal changes. 2. Broad posterior disc protrusion at C6-7 with mild to moderate canal stenosis. 3. Multifactorial degenerative changes with moderate to advanced bilateral C5 and C6 foraminal stenosis. MRI THORACIC SPINE IMPRESSION: 1. Normal MRI appearance of the thoracic spinal cord. No significant canal stenosis identified. 2. Mild degenerative disc bulging at T4-5, T5-6, and T8-9 without stenosis. 3. Left-sided posterior element hypertrophy at T10-11 with resultant mild to moderate left foraminal stenosis. Electronically Signed   By: Jeannine Boga M.D.   On: 09/10/2016 23:15   Mr Thoracic Spine Wo Contrast  Result Date: 09/10/2016 CLINICAL DATA:  Initial evaluation for probable multifactorial gait disturbance. EXAM: MRI HEAD WITHOUT CONTRAST MRI CERVICAL SPINE WITHOUT CONTRAST MRI THORACIC SPINE WITHOUT CONTRAST TECHNIQUE: Multiplanar, multiecho pulse sequences of the brain and surrounding structures, and cervical spine, to include the craniocervical junction and cervicothoracic junction, and thoracic spine were obtained without intravenous contrast. COMPARISON:  None available. FINDINGS: MRI HEAD FINDINGS Brain: Study moderately degraded by motion artifact. Diffuse prominence of the CSF containing spaces compatible with generalized cerebral atrophy, advanced for age. No focal parenchymal signal abnormality identified. No significant cerebral white matter disease. No evidence for acute infarct. Gray-white matter differentiation maintained. No encephalomalacia to suggest chronic infarction. No evidence for acute or chronic intracranial hemorrhage. No mass lesion, midline shift or mass effect. Mild ventricular prominence related to global parenchymal volume loss of hydrocephalus. No extra-axial fluid collection. Major dural sinuses are grossly patent. Pituitary gland  suprasellar region within normal limits. Midline structures intact and normal. Vascular: Major intracranial vascular flow voids maintained. Left vertebral artery dominant and tortuous, invaginating upon the left aspect of the medulla. Skull and upper cervical spine: Craniocervical junction within normal limits. Bone marrow signal intensity normal. No scalp soft tissue abnormality. Sinuses/Orbits: Globes and orbital soft tissues within normal limits. Paranasal sinuses are clear. No mastoid effusion. Inner ear structures normal. Other: None. MRI CERVICAL  SPINE FINDINGS Alignment: Study moderately degraded by motion artifact. Vertebral bodies normally aligned with preservation of the normal cervical lordosis. No listhesis. Vertebrae: Vertebral body heights maintained. No evidence for acute or chronic fracture. Signal intensity within the vertebral body bone marrow somewhat heterogeneous. T1/T2 hyperintensity within the inferior aspect of the C7 vertebral body likely likely reflects a benign hemangioma. No worrisome osseous lesions. No abnormal marrow edema. Cord: Signal intensity within the cervical spinal cord is normal. Posterior Fossa, vertebral arteries, paraspinal tissues: Paraspinous and prevertebral soft tissues within normal limits. Normal intravascular flow voids present within the dominant left vertebral artery. Right vertebral artery not well visualized. Disc levels: C2-C3: Unremarkable. C3-C4: Mild degenerative disc bulge with uncovertebral hypertrophy. No significant canal stenosis. Foramina are grossly patent. C4-C5: Diffuse degenerative disc bulge with bilateral uncovertebral spurring. Broad posterior disc osteophyte mildly flattens the ventral thecal sac without significant canal stenosis. Moderate bilateral C5 foraminal stenosis. C5-C6: Chronic diffuse degenerative disc osteophyte with intervertebral disc space narrowing. Superimposed right paracentral disc protrusion indents the right ventral thecal  sac (series 14, image 21). Flattening of the right hemi cord without cord signal changes. Moderate canal stenosis. Cephalad and caudad migration of disc material posterior to the C5 and C6 vertebral bodies (series 18, image 21 on axial, series 16, image 8 on sagittal). Moderate to severe bilateral C6 foraminal stenosis. C6-C7: Broad posterior disc protrusion flattens and partially faces the ventral thecal sac. Probable ligamentum flavum thickening. Resultant mild to moderate canal stenosis. No cord flattening or cord deformity identified on this motion degraded exam. No obvious foraminal encroachment. C7-T1:  Unremarkable. MRI THORACIC SPINE FINDINGS Alignment:  Study moderately degraded by motion artifact. Vertebral bodies normally aligned with preservation of the normal thoracic kyphosis. No listhesis. Vertebrae: Vertebral body heights maintained. No evidence for acute or chronic fracture. Signal intensity within the vertebral body bone marrow mildly heterogeneous. No worrisome osseous lesions. No abnormal marrow edema. Cord: Signal intensity within the thoracic spinal cord is normal. Paraspinous soft tissues: Paraspinous soft tissues within normal limits. Visualized visceral structures grossly unremarkable. Probable small sebaceous cyst noted within the subcutaneous fat of the mid back. Disc levels: Minimal disc bulging noted at T4-5, T5-6, and T8-9. No significant stenosis. Posterior element hypertrophy at T10-11 with resultant mild to moderate left foraminal stenosis (series 22, image 15). No other significant foraminal encroachment. IMPRESSION: MRI HEAD IMPRESSION: 1. No acute intracranial process identified. 2. Moderately advanced cerebral atrophy for patient age. Otherwise unremarkable brain MRI. MRI CERVICAL SPINE IMPRESSION: 1. Large right paracentral disc protrusion at C5-6 with resultant moderate canal stenosis. Flattening of the right hemi cord without cord signal changes. 2. Broad posterior disc  protrusion at C6-7 with mild to moderate canal stenosis. 3. Multifactorial degenerative changes with moderate to advanced bilateral C5 and C6 foraminal stenosis. MRI THORACIC SPINE IMPRESSION: 1. Normal MRI appearance of the thoracic spinal cord. No significant canal stenosis identified. 2. Mild degenerative disc bulging at T4-5, T5-6, and T8-9 without stenosis. 3. Left-sided posterior element hypertrophy at T10-11 with resultant mild to moderate left foraminal stenosis. Electronically Signed   By: Jeannine Boga M.D.   On: 09/10/2016 23:15        Scheduled Meds: . amLODipine  10 mg Oral Daily  . divalproex  1,500 mg Oral QHS  . enoxaparin (LOVENOX) injection  40 mg Subcutaneous Q24H  . folic acid  1 mg Oral Daily  . insulin aspart  0-9 Units Subcutaneous TID WC  . LORazepam  0.5 mg Oral TID  .  propranolol  20 mg Oral BID  . QUEtiapine  200 mg Oral QHS  . sodium chloride flush  3 mL Intravenous Q12H  . tamsulosin  0.4 mg Oral Daily  . traZODone  100 mg Oral QHS   Continuous Infusions:    LOS: 5 days    Jaciel Diem Tanna Furry, MD [facility] Pager [phone]  If 7PM-7AM, please contact night-coverage [email or web address] Password [email or web address] 09/12/2016, 11:10 AM

## 2016-09-12 NOTE — Progress Notes (Signed)
Reassessed patient and asked about SI/HI and hallucinations. Patient reported "If I were suicidal why would I tell you?" Patient was tangential in thoughts and continued talking about other things including being part of a cult and performing white magic while his roommate performed black magic. Patient stated that he has HI and when asked toward whom he commented "there are too many to tell." Patient did share that he will be safe while he is with [place].

## 2016-09-12 NOTE — Progress Notes (Signed)
Patient has been unable to void this morning. Bladder scan shows 800cc. Patient's PIV infiltrated and was removed. Notified MD. Order to place foley given along with order to start Flomax for urinary retention and MD reports we don't need IV access.

## 2016-09-13 LAB — GLUCOSE, CAPILLARY
GLUCOSE-CAPILLARY: 148 mg/dL — AB (ref 65–99)
Glucose-Capillary: 106 mg/dL — ABNORMAL HIGH (ref 65–99)
Glucose-Capillary: 133 mg/dL — ABNORMAL HIGH (ref 65–99)
Glucose-Capillary: 159 mg/dL — ABNORMAL HIGH (ref 65–99)

## 2016-09-13 MED ORDER — HALOPERIDOL LACTATE 5 MG/ML IJ SOLN
5.0000 mg | Freq: Four times a day (QID) | INTRAMUSCULAR | Status: DC | PRN
Start: 2016-09-13 — End: 2016-10-13

## 2016-09-13 NOTE — Progress Notes (Signed)
PROGRESS NOTE    [name]  IHK:[id number] DOB: January 06, 1966 DOA: 09/07/2016 PCP: Beverly Sessions   Brief Narrative: 51 y.o.malewith history significant of PTSD, bipolar disorder, ADD, depression, anxiety, GERD, CKD stage III, seizure d/o, HTN, HLD, and DM who presentedwith altered mental status. The patient's sister reported he had been cognitively declining for the past year w/ multiple falls at home. He has not been compliant w/ his medications. He had recentlybecome more confused and restless, reporting auditory and visual hallucinations. CT head in the ED was w/o acute findings.   Assessment & Plan:   # Altered mental status likely acute metabolic encephalopathy versus medication related versus chronic cognitive decline: CT scan of head showed mildly advanced cerebral atrophy with minimal chronic ischemic changes. No acute finding. -TSH, vitamin V56, folic acid level acceptable. HIV and RPR nonreactive. -Already evaluated by psychiatrist and adjusted medications. They recommended neurology consult. Evaluated by neurologist and obtained MRI of the brain and spine with no acute finding. Neurologist discontinued Truvada as there is no clear indication for this medication.  -Mental status is improving. Out of bed to chair today. -No signs of infection noted at this time -Continue supportive care.   # Acute kidney injury on CKD stage III -Serum creatinine level is stable. Avoid nephrotoxins. Monitor urine output.  # Acute urinary retention: Required Foley catheter insertion. Out of bed to chair today. Increase activity including ambulation with support. On Flomax. We will try to discontinue catheter in 1-2 days.   # PTSD/anxiety/attention deficit disorder/bipolar disorde, Suicidal ideation -Medications adjusted by psychiatrist. Continue suicide precautions.  Psychiatrist recommended inpatient psych admission. Patient is medically stable to transfer his care to inpatient psych. He still has  low mood. Social worker following for inpatient rehabilitation discharge. Continue current management. -Taper Ativan 0.5 three times a day. -Waiting for inpatient psychiatric floor bed. Social worker eval is ongoing.  #History of seizure disorder -Continue Depakote. Ativan as needed. No seizure-like activity. Neurology consult appreciated.  # Hypertension -Continue Norvasc and propranolol. Monitor blood pressure and heart rate.  # Hypokalemia: Improved. Magnesium level acceptable.  #Chronic thrombocytopenia: No sign of bleeding. Monitor platelet counts.   Principal Problem:   Acute metabolic encephalopathy Active Problems:   Bipolar disorder ([place])   Anxiety state   Depression   Essential hypertension   Diabetes type 2, uncontrolled (HCC)   Hypokalemia   Seizures (HCC)   HLD (hyperlipidemia)   PTSD (post-traumatic stress disorder)   Acute encephalopathy   CRI (chronic renal insufficiency), stage 3 (moderate)   Fall  DVT prophylaxis: Lovenox subcutaneous Code Status: Full code Family Communication: No family at bedside Disposition Plan: Likely discharge to inpatient psych in 1-2 days when bed is available.     Consultants:   Psychiatrist  Procedures: None Antimicrobials: None  Subjective: Seen and examined at bedside. Patient is alert awake and pleasant. Denied headache, dizziness, chest pain or shortness of breath.  Objective: Vitals:   09/12/16 1556 09/12/16 1558 09/12/16 1911 09/13/16 0526  BP:  127/72 122/80 115/76  Pulse: 65 (!) 58 (!) 58 (!) 55  Resp:  15 15   Temp:  98 F (36.7 C) 97.4 F (36.3 C) 97.4 F (36.3 C)  TempSrc:  Oral Oral Oral  SpO2: 99% 96% 97% 97%  Weight:      Height:        Intake/Output Summary (Last 24 hours) at 09/13/16 1116 Last data filed at 09/13/16 0919  Gross per 24 hour  Intake  1020 ml  Output             2000 ml  Net             -980 ml   Filed Weights   09/10/16 1029  Weight: 94.4 kg (208 lb 1.8  oz)    Examination:  General exam: Not in distress Respiratory system: Clear bilateral. Respiratory effort normal. No wheezing or crackle Cardiovascular system: Regular rate rhythm, S1-S2 normal.  No pedal edema. Gastrointestinal system: Abdomen is nondistended, soft and nontender. Normal bowel sounds heard. Central nervous system: Following commands, oriented, nonfocal Extremities: Symmetric 5 x 5 power. Skin: No rashes, lesions or ulcers Psychiatry: Depressed mood. Intermittent suicidal ideation, unchanged today.  Data Reviewed: I have personally reviewed following labs and imaging studies  CBC:  Recent Labs Lab 09/07/16 1734 09/08/16 0220 09/09/16 0416 09/10/16 0752  WBC 6.1 5.5 9.2 5.3  NEUTROABS 4.3  --   --   --   HGB 14.1 12.9* 15.2 13.6  HCT 39.5 37.3* 43.4 40.3  MCV 86.6 87.1 88.0 89.4  PLT 103* 88* 105* 81*   Basic Metabolic Panel:  Recent Labs Lab 09/07/16 1734 09/08/16 0220 09/09/16 0416 09/10/16 0752  NA 135 138 139 139  K 3.4* 3.3* 3.2* 3.8  CL 103 107 104 108  CO2 23 23 25 25   GLUCOSE 144* 96 101* 126*  BUN 22* 16 15 21*  CREATININE 2.19* 1.82* 1.96* 1.99*  CALCIUM 9.2 8.6* 9.6 8.8*  MG  --   --  1.9  --   PHOS  --   --  2.3*  --    GFR: Estimated Creatinine Clearance: 55.7 mL/min (A) (by C-G formula based on SCr of 1.99 mg/dL (H)). Liver Function Tests:  Recent Labs Lab 09/07/16 1734  AST 46*  ALT 30  ALKPHOS 106  BILITOT 1.1  PROT 6.8  ALBUMIN 4.0   No results for input(s): LIPASE, AMYLASE in the last 168 hours.  Recent Labs Lab 09/07/16 1734  AMMONIA 43*   Coagulation Profile:  Recent Labs Lab 09/07/16 1734  INR 1.09   Cardiac Enzymes:  Recent Labs Lab 09/07/16 1733  TROPONINI <0.03   BNP (last 3 results) No results for input(s): PROBNP in the last 8760 hours. HbA1C: No results for input(s): HGBA1C in the last 72 hours. CBG:  Recent Labs Lab 09/12/16 0756 09/12/16 1207 09/12/16 1719 09/12/16 2144  09/13/16 0740  GLUCAP 136* 155* 122* 166* 106*   Lipid Profile: No results for input(s): CHOL, HDL, LDLCALC, TRIG, CHOLHDL, LDLDIRECT in the last 72 hours. Thyroid Function Tests: No results for input(s): TSH, T4TOTAL, FREET4, T3FREE, THYROIDAB in the last 72 hours. Anemia Panel: No results for input(s): VITAMINB12, FOLATE, FERRITIN, TIBC, IRON, RETICCTPCT in the last 72 hours. Sepsis Labs: No results for input(s): PROCALCITON, LATICACIDVEN in the last 168 hours.  Recent Results (from the past 240 hour(s))  MRSA PCR Screening     Status: None   Collection Time: 09/08/16  3:22 AM  Result Value Ref Range Status   MRSA by PCR NEGATIVE NEGATIVE Final    Comment:        The GeneXpert MRSA Assay (FDA approved for NASAL specimens only), is one component of a comprehensive MRSA colonization surveillance program. It is not intended to diagnose MRSA infection nor to guide or monitor treatment for MRSA infections.          Radiology Studies: No results found.      Scheduled Meds: . amLODipine  10 mg Oral Daily  . divalproex  1,500 mg Oral QHS  . enoxaparin (LOVENOX) injection  40 mg Subcutaneous Q24H  . folic acid  1 mg Oral Daily  . insulin aspart  0-9 Units Subcutaneous TID WC  . LORazepam  0.5 mg Oral TID  . propranolol  20 mg Oral BID  . QUEtiapine  200 mg Oral QHS  . sodium chloride flush  3 mL Intravenous Q12H  . tamsulosin  0.4 mg Oral Daily  . traZODone  100 mg Oral QHS   Continuous Infusions:    LOS: 6 days    Iyah Laguna Tanna Furry, MD [facility] Pager [phone]  If 7PM-7AM, please contact night-coverage [email or web address] Password [email or web address] 09/13/2016, 11:16 AM

## 2016-09-14 DIAGNOSIS — R339 Retention of urine, unspecified: Secondary | ICD-10-CM | POA: Diagnosis present

## 2016-09-14 LAB — GLUCOSE, CAPILLARY
Glucose-Capillary: 138 mg/dL — ABNORMAL HIGH (ref 65–99)
Glucose-Capillary: 111 mg/dL — ABNORMAL HIGH (ref 65–99)
Glucose-Capillary: 143 mg/dL — ABNORMAL HIGH (ref 65–99)
Glucose-Capillary: 145 mg/dL — ABNORMAL HIGH (ref 65–99)

## 2016-09-14 NOTE — Progress Notes (Signed)
PROGRESS NOTE    [name]  XAJ:[id number] DOB: 02-18-1966 DOA: 09/07/2016 PCP: Beverly Sessions   Brief Narrative: 51 y.o.malewith history significant of PTSD, bipolar disorder, ADD, depression, anxiety, GERD, CKD stage III, seizure d/o, HTN, HLD, and DM who presentedwith altered mental status. The patient's sister reported he had been cognitively declining for the past year w/ multiple falls at home. He has not been compliant w/ his medications. He had recentlybecome more confused and restless, reporting auditory and visual hallucinations. CT head in the ED was w/o acute findings.   Assessment & Plan:   # Altered mental status likely acute metabolic encephalopathy versus medication related versus chronic cognitive decline: CT scan of head showed mildly advanced cerebral atrophy with minimal chronic ischemic changes. No acute finding. -TSH, vitamin C94, folic acid level acceptable. HIV and RPR nonreactive. -Already evaluated by psychiatrist and adjusted medications. They recommended neurology consult. Evaluated by neurologist and obtained MRI of the brain and spine with no acute finding. Neurologist discontinued Truvada as there is no clear indication for this medication.  -Mental status is gradually improving. He was sitting on chair today. Plan to remove Foley catheter today. Continue activity. Still awaiting from social worker regarding safe discharge plan. -No signs of infection noted at this time   # Acute kidney injury on CKD stage III -Serum creatinine level  stable. Avoid nephrotoxins. Monitor urine output.  # Acute urinary retention: Patient with increased ambulation. Plan to discontinue Foley catheter, bladder scan. Continue Flomax. Continue supportive care.  # PTSD/anxiety/attention deficit disorder/bipolar disorde, Suicidal ideation -Medications adjusted by psychiatrist. Continue suicide precautions.  Psychiatrist recommended inpatient psych admission. Patient is medically  stable to transfer his care to inpatient psych. He still has low mood. Social worker following for inpatient rehabilitation discharge. Continue current management. -Taper Ativan 0.5 three times a day. -Waiting for inpatient psychiatric floor bed. Social worker eval is ongoing.  #History of seizure disorder -Continue Depakote. Ativan as needed. No seizure-like activity. Neurology consult appreciated.  # Hypertension -Continue Norvasc and propranolol. Monitor blood pressure and heart rate.  # Hypokalemia: Improved. Magnesium level acceptable.  #Chronic thrombocytopenia: No sign of bleeding. Monitor platelet counts.   Principal Problem:   Acute metabolic encephalopathy Active Problems:   Bipolar disorder ([place])   Anxiety state   Depression   Essential hypertension   Diabetes type 2, uncontrolled (HCC)   Hypokalemia   Seizures (HCC)   HLD (hyperlipidemia)   PTSD (post-traumatic stress disorder)   Acute encephalopathy   CRI (chronic renal insufficiency), stage 3 (moderate)   Fall  DVT prophylaxis: Lovenox subcutaneous Code Status: Full code Family Communication: No family at bedside Disposition Plan: Likely discharge to inpatient psych in 1-2 days when bed is available.     Consultants:   Psychiatrist  Procedures: None Antimicrobials: None  Subjective: Seen and examined at bedside. Sitting on chair comfortably. Denied headache, dizziness, chest pain or shortness of breath. No new event. Objective: Vitals:   09/13/16 1300 09/13/16 2142 09/14/16 0548 09/14/16 0925  BP: 127/66 126/74 (!) 108/59 106/62  Pulse: (!) 57 61 (!) 52 66  Resp: 16 18 18 17   Temp: 98 F (36.7 C) 98.3 F (36.8 C) 98.5 F (36.9 C) 98 F (36.7 C)  TempSrc: Oral   Oral  SpO2: 98% 96% 97% 98%  Weight:      Height:        Intake/Output Summary (Last 24 hours) at 09/14/16 1216 Last data filed at 09/14/16 0900  Gross per 24 hour  Intake             1200 ml  Output             2010 ml  Net              -810 ml   Filed Weights   09/10/16 1029  Weight: 94.4 kg (208 lb 1.8 oz)    Examination:  General exam: Not in distress  Respiratory system: Clear bilateral. Respiratory effort normal. No wheezing or crackle Cardiovascular system: Regular rate and rhythm, S1-S2 normal.  No pedal edema. Gastrointestinal system: Abdomen is nondistended, soft and nontender. Normal bowel sounds heard. Central nervous system: Following commands, oriented, nonfocal Extremities: Symmetric 5 x 5 power. Skin: No rashes, lesions or ulcers Psychiatry: Depressed mood. Intermittent suicidal ideation, unchanged today.  Data Reviewed: I have personally reviewed following labs and imaging studies  CBC:  Recent Labs Lab 09/07/16 1734 09/08/16 0220 09/09/16 0416 09/10/16 0752  WBC 6.1 5.5 9.2 5.3  NEUTROABS 4.3  --   --   --   HGB 14.1 12.9* 15.2 13.6  HCT 39.5 37.3* 43.4 40.3  MCV 86.6 87.1 88.0 89.4  PLT 103* 88* 105* 81*   Basic Metabolic Panel:  Recent Labs Lab 09/07/16 1734 09/08/16 0220 09/09/16 0416 09/10/16 0752  NA 135 138 139 139  K 3.4* 3.3* 3.2* 3.8  CL 103 107 104 108  CO2 23 23 25 25   GLUCOSE 144* 96 101* 126*  BUN 22* 16 15 21*  CREATININE 2.19* 1.82* 1.96* 1.99*  CALCIUM 9.2 8.6* 9.6 8.8*  MG  --   --  1.9  --   PHOS  --   --  2.3*  --    GFR: Estimated Creatinine Clearance: 55.7 mL/min (A) (by C-G formula based on SCr of 1.99 mg/dL (H)). Liver Function Tests:  Recent Labs Lab 09/07/16 1734  AST 46*  ALT 30  ALKPHOS 106  BILITOT 1.1  PROT 6.8  ALBUMIN 4.0   No results for input(s): LIPASE, AMYLASE in the last 168 hours.  Recent Labs Lab 09/07/16 1734  AMMONIA 43*   Coagulation Profile:  Recent Labs Lab 09/07/16 1734  INR 1.09   Cardiac Enzymes:  Recent Labs Lab 09/07/16 1733  TROPONINI <0.03   BNP (last 3 results) No results for input(s): PROBNP in the last 8760 hours. HbA1C: No results for input(s): HGBA1C in the last 72  hours. CBG:  Recent Labs Lab 09/13/16 0740 09/13/16 1146 09/13/16 1752 09/13/16 2140 09/14/16 0819  GLUCAP 106* 148* 133* 159* 138*   Lipid Profile: No results for input(s): CHOL, HDL, LDLCALC, TRIG, CHOLHDL, LDLDIRECT in the last 72 hours. Thyroid Function Tests: No results for input(s): TSH, T4TOTAL, FREET4, T3FREE, THYROIDAB in the last 72 hours. Anemia Panel: No results for input(s): VITAMINB12, FOLATE, FERRITIN, TIBC, IRON, RETICCTPCT in the last 72 hours. Sepsis Labs: No results for input(s): PROCALCITON, LATICACIDVEN in the last 168 hours.  Recent Results (from the past 240 hour(s))  MRSA PCR Screening     Status: None   Collection Time: 09/08/16  3:22 AM  Result Value Ref Range Status   MRSA by PCR NEGATIVE NEGATIVE Final    Comment:        The GeneXpert MRSA Assay (FDA approved for NASAL specimens only), is one component of a comprehensive MRSA colonization surveillance program. It is not intended to diagnose MRSA infection nor to guide or monitor treatment for MRSA infections.  Radiology Studies: No results found.      Scheduled Meds: . amLODipine  10 mg Oral Daily  . divalproex  1,500 mg Oral QHS  . enoxaparin (LOVENOX) injection  40 mg Subcutaneous Q24H  . folic acid  1 mg Oral Daily  . insulin aspart  0-9 Units Subcutaneous TID WC  . LORazepam  0.5 mg Oral TID  . propranolol  20 mg Oral BID  . QUEtiapine  200 mg Oral QHS  . sodium chloride flush  3 mL Intravenous Q12H  . tamsulosin  0.4 mg Oral Daily  . traZODone  100 mg Oral QHS   Continuous Infusions:    LOS: 7 days    Blanch Stang Tanna Furry, MD [facility] Pager [phone]  If 7PM-7AM, please contact night-coverage [email or web address] Password [email or web address] 09/14/2016, 12:16 PM

## 2016-09-15 ENCOUNTER — Inpatient Hospital Stay (HOSPITAL_COMMUNITY): Payer: Medicaid Other

## 2016-09-15 LAB — CBC WITH DIFFERENTIAL/PLATELET
BASOS PCT: 0 %
Basophils Absolute: 0 10*3/uL (ref 0.0–0.1)
EOS ABS: 0 10*3/uL (ref 0.0–0.7)
EOS PCT: 0 %
HCT: 43.1 % (ref 39.0–52.0)
Hemoglobin: 14.8 g/dL (ref 13.0–17.0)
Lymphocytes Relative: 11 %
Lymphs Abs: 1.4 10*3/uL (ref 0.7–4.0)
MCH: 30.6 pg (ref 26.0–34.0)
MCHC: 34.3 g/dL (ref 30.0–36.0)
MCV: 89.2 fL (ref 78.0–100.0)
MONO ABS: 2.5 10*3/uL — AB (ref 0.1–1.0)
MONOS PCT: 19 %
Neutro Abs: 9.1 10*3/uL — ABNORMAL HIGH (ref 1.7–7.7)
Neutrophils Relative %: 70 %
Platelets: 109 10*3/uL — ABNORMAL LOW (ref 150–400)
RBC: 4.83 MIL/uL (ref 4.22–5.81)
RDW: 12.7 % (ref 11.5–15.5)
WBC: 13 10*3/uL — ABNORMAL HIGH (ref 4.0–10.5)

## 2016-09-15 LAB — URINALYSIS, ROUTINE W REFLEX MICROSCOPIC
BILIRUBIN URINE: NEGATIVE
KETONES UR: 5 mg/dL — AB
NITRITE: NEGATIVE
PH: 6 (ref 5.0–8.0)
Protein, ur: 30 mg/dL — AB
Specific Gravity, Urine: 1.021 (ref 1.005–1.030)

## 2016-09-15 LAB — GLUCOSE, CAPILLARY
GLUCOSE-CAPILLARY: 217 mg/dL — AB (ref 65–99)
GLUCOSE-CAPILLARY: 253 mg/dL — AB (ref 65–99)
Glucose-Capillary: 188 mg/dL — ABNORMAL HIGH (ref 65–99)
Glucose-Capillary: 196 mg/dL — ABNORMAL HIGH (ref 65–99)

## 2016-09-15 LAB — LACTIC ACID, PLASMA: LACTIC ACID, VENOUS: 1.2 mmol/L (ref 0.5–1.9)

## 2016-09-15 MED ORDER — ACETAMINOPHEN 325 MG PO TABS
650.0000 mg | ORAL_TABLET | Freq: Four times a day (QID) | ORAL | Status: DC | PRN
  Administered 2016-09-15 – 2016-10-07 (×7): 650 mg via ORAL
  Filled 2016-09-15 (×7): qty 2

## 2016-09-15 MED ORDER — LORAZEPAM 0.5 MG PO TABS
0.5000 mg | ORAL_TABLET | Freq: Two times a day (BID) | ORAL | Status: DC
  Administered 2016-09-15 – 2016-09-23 (×16): 0.5 mg via ORAL
  Filled 2016-09-15 (×16): qty 1

## 2016-09-15 MED ORDER — TESTOSTERONE 50 MG/5GM (1%) TD GEL
5.0000 g | Freq: Every day | TRANSDERMAL | Status: DC
  Administered 2016-09-15 – 2016-10-12 (×27): 5 g via TRANSDERMAL
  Filled 2016-09-15 (×30): qty 5

## 2016-09-15 NOTE — Progress Notes (Addendum)
Pt. BP is 106/55, HR  70 this am, he is scheduled to get 10 mg of norvasc & 20 mg of propranolol.  Text paged MD to see if want to still  give both.  Will continue to monitor and await for return call or new orders.  Alphonzo Lemmings, RN

## 2016-09-15 NOTE — Progress Notes (Signed)
Return call from Dr. Carolin Sicks ordering to hold norvasc this am with low BP, ok to give propranolol.  Will continue to monitor.  Alphonzo Lemmings, RN

## 2016-09-15 NOTE — Progress Notes (Signed)
Physical Therapy Treatment Patient Details Name: [name] MRN: [id number] DOB: 1966-04-21 Today's Date: 09/15/2016    History of Present Illness 51 y.o.malewith history significant of PTSD, bipolar disorder, ADD, depression, anxiety, GERD, CKD stage III, seizure d/o, HTN, HLD, and DM who presentedwith altered mental status. The patient's sister reported he had been cognitively declining for the past year w/ multiple falls at home. Non - compliant w/ his medications per family, more confused and restless, reporting auditory and visual hallucinations. CT head in the ED was w/o acute findings.     PT Comments    Patient progressing slowly toward PT goals. Continues to have confusion, not answering questions appropriately. Tangential at times. Pt with poor sitting balance with LOB posteriorly x3 during dynamic sitting tasks. Gait mechanics seem improved from prior session but still requires Min A for balance/safety. Will continue to follow.    Follow Up Recommendations  SNF;Supervision/Assistance - 24 hour     Equipment Recommendations  Other (comment) (TBA)    Recommendations for Other Services       Precautions / Restrictions Precautions Precautions: Fall Restrictions Weight Bearing Restrictions: No    Mobility  Bed Mobility Overal bed mobility: Needs Assistance Bed Mobility: Supine to Sit;Sit to Supine     Supine to sit: Min assist;HOB elevated Sit to supine: Min guard   General bed mobility comments: Multiple attempts to raise trunk to get to EOB with use of momentum, requiring Min A to come forward with trunk. Performed x3.  Transfers Overall transfer level: Needs assistance Equipment used: Rolling walker (2 wheeled) Transfers: Sit to/from Stand Sit to Stand: Min assist         General transfer comment: Assist to power to standing. Stood from [employer].   Ambulation/Gait Ambulation/Gait assistance: Min assist Ambulation Distance (Feet): 16 Feet (x2  bouts) Assistive device: Rolling walker (2 wheeled) Gait Pattern/deviations: Step-through pattern;Staggering right;Staggering left;Narrow base of support;Scissoring;Trunk flexed Gait velocity: decreased Gait velocity interpretation: Below normal speed for age/gender General Gait Details: Slow, unsteady gait with difficulty navigating RW. Decreased coordination of stepping and scissoring gait noted x2. Left RW in bathroom and attempted to walk without it- needing cues to use it.   Stairs            Wheelchair Mobility    Modified Rankin (Stroke Patients Only)       Balance Overall balance assessment: History of Falls;Needs assistance Sitting-balance support: No upper extremity supported;Feet unsupported Sitting balance-Leahy Scale: Poor Sitting balance - Comments: pt changed gown top sitting EOB, 3 LOB posteriory during task. Unable to donn sock. Postural control: Posterior lean Standing balance support: During functional activity Standing balance-Leahy Scale: Poor Standing balance comment: Reliant on UEs for support in standing.                             Cognition Arousal/Alertness: Awake/alert Behavior During Therapy: WFL for tasks assessed/performed Overall Cognitive Status: Impaired/Different from baseline Area of Impairment: Attention                 Orientation Level: Disoriented to;Situation;Time ("hell" ) Current Attention Level: Selective;Sustained   Following Commands: Follows one step commands consistently (with increased time/repetition) Safety/Judgement: Decreased awareness of safety;Decreased awareness of deficits Awareness: Intellectual Problem Solving: Slow processing;Requires verbal cues General Comments: Pt with difficulty answering questions directly and would deviate with answer from original question. Requires constant repetition to perform tasks.       Exercises  General Comments General comments (skin integrity, edema,  etc.): Sitter present during session.      Pertinent Vitals/Pain Pain Assessment: No/denies pain    Home Living                      Prior Function            PT Goals (current goals can now be found in the care plan section) Progress towards PT goals: Progressing toward goals (slowly)    Frequency    Min 2X/week      PT Plan Frequency needs to be updated    Co-evaluation              AM-PAC PT "6 Clicks" Daily Activity  Outcome Measure  Difficulty turning over in bed (including adjusting bedclothes, sheets and blankets)?: None Difficulty moving from lying on back to sitting on the side of the bed? : Total Difficulty sitting down on and standing up from a chair with arms (e.g., wheelchair, bedside commode, etc,.)?: Total Help needed moving to and from a bed to chair (including a wheelchair)?: A Little Help needed walking in hospital room?: A Little Help needed climbing 3-5 steps with a railing? : A Lot 6 Click Score: 14    End of Session Equipment Utilized During Treatment: Gait belt Activity Tolerance: Patient limited by fatigue Patient left: in bed;with call bell/phone within reach;with nursing/sitter in room Nurse Communication: Mobility status PT Visit Diagnosis: Repeated falls (R29.6);Difficulty in walking, not elsewhere classified (R26.2)     Time: 1432-1450 PT Time Calculation (min) (ACUTE ONLY): 18 min  Charges:  $Therapeutic Activity: 8-22 mins                    G Codes:       Wray Kearns, PT, DPT [phone]     Marguarite Arbour A Melvine Julin 09/15/2016, 2:57 PM

## 2016-09-15 NOTE — Progress Notes (Signed)
PROGRESS NOTE    [name]  BOF:[id number] DOB: [birth year] DOA: 09/07/2016 PCP: Beverly Sessions   Brief Narrative: 51 y.o.malewith history significant of PTSD, bipolar disorder, ADD, depression, anxiety, GERD, CKD stage III, seizure d/o, HTN, HLD, and DM who presentedwith altered mental status. The patient's sister reported he had been cognitively declining for the past year w/ multiple falls at home. He has not been compliant w/ his medications. He had recentlybecome more confused and restless, reporting auditory and visual hallucinations. CT head in the ED was w/o acute findings.   Assessment & Plan:   # Altered mental status likely acute metabolic encephalopathy versus medication related versus chronic cognitive decline: CT scan of head showed mildly advanced cerebral atrophy with minimal chronic ischemic changes. No acute finding. -TSH, vitamin D78, folic acid level acceptable. HIV and RPR nonreactive. -Already evaluated by psychiatrist and adjusted medications. They recommended neurology consult. Evaluated by neurologist and obtained MRI of the brain and spine with no acute finding. Neurologist discontinued Truvada as there is no clear indication for this medication.  -Mental status is gradually improving. Taper Ativan to twice a day. Indwelling Foley catheter was removed  and able to urinate without any difficulties. Noticed low-grade temperature this morning. Continue supportive care. No sign of infection. Social worker eval is ongoing for safe discharge planning either to inpatient behavioral versus a skilled facility.   # Acute kidney injury on CKD stage III -Serum creatinine level  stable. Avoid nephrotoxins. Monitor urine output.  # Acute urinary retention: Discontinued indwelling Foley catheter. Continue Flomax.  # PTSD/anxiety/attention deficit disorder/bipolar disorde, Suicidal ideation -Medications adjusted by psychiatrist. Continue suicide precautions.  Psychiatrist  recommended inpatient psych admission. Patient is medically stable to transfer his care to inpatient psych. He still has low mood. Social worker following for inpatient rehabilitation discharge. Continue current management. -Taper Ativan 0.5 twice a day. -Waiting for inpatient psychiatric floor bed. Social worker eval is ongoing.  #History of seizure disorder -Continue Depakote. Ativan as needed. No seizure-like activity. Neurology consult appreciated.  # Hypertension -Borderline low blood pressure today therefore holding Norvasc and continue propranolol. I'll order antihypertensive with some parameters. Monitor blood pressure and heart rate.   # Hypokalemia: Improved. Magnesium level acceptable.  #Chronic thrombocytopenia: No sign of bleeding. Monitor platelet counts.   Principal Problem:   Acute metabolic encephalopathy Active Problems:   Bipolar disorder ([place])   Anxiety state   Depression   Essential hypertension   Diabetes type 2, uncontrolled (HCC)   Hypokalemia   Seizures (HCC)   HLD (hyperlipidemia)   PTSD (post-traumatic stress disorder)   Acute encephalopathy   CRI (chronic renal insufficiency), stage 3 (moderate)   Fall   Acute urinary retention  DVT prophylaxis: Lovenox subcutaneous Code Status: Full code Family Communication: No family at bedside Disposition Plan: Likely discharge to inpatient psych vs SNF in 1-2 days when bed is available.     Consultants:   Psychiatrist  Procedures: None Antimicrobials: None  Subjective: Seen and examined at bedside. Lying on bed comfortable, not in distress. Fully removed. Reports low mood.  Objective: Vitals:   09/14/16 1644 09/14/16 2210 09/15/16 0517 09/15/16 0918  BP: 109/67 136/73 107/63 (!) 106/55  Pulse: 60 72 76 70  Resp: 18 18 18    Temp: 98 F (36.7 C) 98.9 F (37.2 C) 100.1 F (37.8 C)   TempSrc: Oral  Oral   SpO2: 100% 98% 97%   Weight:   97.1 kg (214 lb 1.1 oz)   Height:  Intake/Output Summary (Last 24 hours) at 09/15/16 1453 Last data filed at 09/14/16 2101  Gross per 24 hour  Intake              240 ml  Output              550 ml  Net             -310 ml   Filed Weights   09/10/16 1029 09/15/16 0517  Weight: 94.4 kg (208 lb 1.8 oz) 97.1 kg (214 lb 1.1 oz)    Examination:  General exam: Not in distress Respiratory system: Clear. Bilateral. Respiratory effort normal. No wheezing or crackle Cardiovascular system: Regular rate and rhythm, S1-S2 normal.  No pedal edema. Gastrointestinal system: Abdomen is nondistended, soft and nontender. Normal bowel sounds heard. Central nervous system: Following commands, oriented, nonfocal Extremities: Symmetric 5 x 5 power. Skin: No rashes, lesions or ulcers Psychiatry: Depressed mood with intermittent suicidal ideation. Unchanged  Data Reviewed: I have personally reviewed following labs and imaging studies  CBC:  Recent Labs Lab 09/09/16 0416 09/10/16 0752  WBC 9.2 5.3  HGB 15.2 13.6  HCT 43.4 40.3  MCV 88.0 89.4  PLT 105* 81*   Basic Metabolic Panel:  Recent Labs Lab 09/09/16 0416 09/10/16 0752  NA 139 139  K 3.2* 3.8  CL 104 108  CO2 25 25  GLUCOSE 101* 126*  BUN 15 21*  CREATININE 1.96* 1.99*  CALCIUM 9.6 8.8*  MG 1.9  --   PHOS 2.3*  --    GFR: Estimated Creatinine Clearance: 55.7 mL/min (A) (by C-G formula based on SCr of 1.99 mg/dL (H)). Liver Function Tests: No results for input(s): AST, ALT, ALKPHOS, BILITOT, PROT, ALBUMIN in the last 168 hours. No results for input(s): LIPASE, AMYLASE in the last 168 hours. No results for input(s): AMMONIA in the last 168 hours. Coagulation Profile: No results for input(s): INR, PROTIME in the last 168 hours. Cardiac Enzymes: No results for input(s): CKTOTAL, CKMB, CKMBINDEX, TROPONINI in the last 168 hours. BNP (last 3 results) No results for input(s): PROBNP in the last 8760 hours. HbA1C: No results for input(s): HGBA1C in the last  72 hours. CBG:  Recent Labs Lab 09/14/16 1236 09/14/16 1707 09/14/16 2214 09/15/16 0826 09/15/16 1217  GLUCAP 111* 143* 145* 188* 196*   Lipid Profile: No results for input(s): CHOL, HDL, LDLCALC, TRIG, CHOLHDL, LDLDIRECT in the last 72 hours. Thyroid Function Tests: No results for input(s): TSH, T4TOTAL, FREET4, T3FREE, THYROIDAB in the last 72 hours. Anemia Panel: No results for input(s): VITAMINB12, FOLATE, FERRITIN, TIBC, IRON, RETICCTPCT in the last 72 hours. Sepsis Labs: No results for input(s): PROCALCITON, LATICACIDVEN in the last 168 hours.  Recent Results (from the past 240 hour(s))  MRSA PCR Screening     Status: None   Collection Time: 09/08/16  3:22 AM  Result Value Ref Range Status   MRSA by PCR NEGATIVE NEGATIVE Final    Comment:        The GeneXpert MRSA Assay (FDA approved for NASAL specimens only), is one component of a comprehensive MRSA colonization surveillance program. It is not intended to diagnose MRSA infection nor to guide or monitor treatment for MRSA infections.          Radiology Studies: No results found.      Scheduled Meds: . amLODipine  10 mg Oral Daily  . divalproex  1,500 mg Oral QHS  . enoxaparin (LOVENOX) injection  40 mg Subcutaneous Q24H  .  folic acid  1 mg Oral Daily  . insulin aspart  0-9 Units Subcutaneous TID WC  . LORazepam  0.5 mg Oral BID  . propranolol  20 mg Oral BID  . QUEtiapine  200 mg Oral QHS  . sodium chloride flush  3 mL Intravenous Q12H  . tamsulosin  0.4 mg Oral Daily  . traZODone  100 mg Oral QHS   Continuous Infusions:    LOS: 8 days    Scarlett Portlock Tanna Furry, MD [facility] Pager [phone]  If 7PM-7AM, please contact night-coverage [email or web address] Password [facility] 09/15/2016, 2:53 PM

## 2016-09-16 DIAGNOSIS — N390 Urinary tract infection, site not specified: Secondary | ICD-10-CM | POA: Diagnosis present

## 2016-09-16 DIAGNOSIS — Z818 Family history of other mental and behavioral disorders: Secondary | ICD-10-CM

## 2016-09-16 LAB — CBC
HEMATOCRIT: 40.7 % (ref 39.0–52.0)
Hemoglobin: 14 g/dL (ref 13.0–17.0)
MCH: 30.6 pg (ref 26.0–34.0)
MCHC: 34.4 g/dL (ref 30.0–36.0)
MCV: 89.1 fL (ref 78.0–100.0)
Platelets: 92 10*3/uL — ABNORMAL LOW (ref 150–400)
RBC: 4.57 MIL/uL (ref 4.22–5.81)
RDW: 12.7 % (ref 11.5–15.5)
WBC: 13.4 10*3/uL — AB (ref 4.0–10.5)

## 2016-09-16 LAB — BASIC METABOLIC PANEL
ANION GAP: 9 (ref 5–15)
BUN: 30 mg/dL — AB (ref 6–20)
CALCIUM: 9.1 mg/dL (ref 8.9–10.3)
CO2: 19 mmol/L — AB (ref 22–32)
Chloride: 106 mmol/L (ref 101–111)
Creatinine, Ser: 2.01 mg/dL — ABNORMAL HIGH (ref 0.61–1.24)
GFR calc Af Amer: 43 mL/min — ABNORMAL LOW (ref >60)
GFR calc non Af Amer: 38 mL/min — ABNORMAL LOW (ref >60)
GLUCOSE: 212 mg/dL — AB (ref 65–99)
Potassium: 4.5 mmol/L (ref 3.5–5.1)
Sodium: 134 mmol/L — ABNORMAL LOW (ref 135–145)

## 2016-09-16 LAB — GLUCOSE, CAPILLARY
GLUCOSE-CAPILLARY: 152 mg/dL — AB (ref 65–99)
GLUCOSE-CAPILLARY: 233 mg/dL — AB (ref 65–99)
Glucose-Capillary: 189 mg/dL — ABNORMAL HIGH (ref 65–99)
Glucose-Capillary: 183 mg/dL — ABNORMAL HIGH (ref 65–99)
Glucose-Capillary: 146 mg/dL — ABNORMAL HIGH (ref 65–99)

## 2016-09-16 MED ORDER — DEXTROSE 5 % IV SOLN
1.0000 g | INTRAVENOUS | Status: DC
  Administered 2016-09-16 – 2016-09-17 (×2): 1 g via INTRAVENOUS
  Filled 2016-09-16 (×3): qty 10

## 2016-09-16 NOTE — Care Management Important Message (Signed)
Important Message  Patient Details  Name: [name] MRN: [id number] Date of Birth: 07/17/1965   Medicare Important Message Given:  Yes    Fawnda Vitullo Abena 09/16/2016, 1:20 PM

## 2016-09-16 NOTE — Consult Note (Signed)
[place] Psychiatry Consult   Reason for Consult:  Agitation Referring Physician:  Hospitalist Patient Identification: [name] MRN:  [id number] Principal Diagnosis: Acute metabolic encephalopathy Diagnosis:   Patient Active Problem List   Diagnosis Date Noted  . Acute lower UTI [N39.0]   . Acute urinary retention [R33.8]   . Acute metabolic encephalopathy [N27.78] 09/07/2016  . HLD (hyperlipidemia) [E78.5] 09/07/2016  . GERD (gastroesophageal reflux disease) [K21.9] 09/07/2016  . PTSD (post-traumatic stress disorder) [F43.10] 09/07/2016  . Acute encephalopathy [G93.40] 09/07/2016  . Fall [W19.XXXA] 09/07/2016  . CRI (chronic renal insufficiency), stage 3 (moderate) [N18.3]   . Altered mental status [R41.82]   . Multiple drug overdose [T50.901A] 12/27/2015  . Suicidal overdose ([place]) [T50.902A] 12/26/2015  . Suicide attempt by multiple drug overdose ([place]) [T50.902A] 12/26/2015  . Ataxia [R27.0] 10/14/2015  . Slurring of speech [R47.81] 10/14/2015  . Leg weakness [R29.898] 10/03/2015  . CRF (chronic renal failure) [N18.9] 10/03/2015  . Hypokalemia [E87.6] 10/03/2015  . Hemiparesis ([place]) [G81.90]   . Seizures ([place]) [R56.9]   . Primary osteoarthritis of left hip [M16.12] 05/22/2015  . Exposure to potentially hazardous body fluids [Z77.21] 02/12/2015  . Loss of weight [R63.4] 09/04/2014  . Dyslipidemia [E78.5] 05/31/2014  . Left hip pain [M25.552] 02/13/2014  . Insomnia [G47.00] 11/11/2013  . Degenerative joint disease (DJD) of hip [M16.9] 08/16/2013  . Acute respiratory failure ([place]) [J96.00] 11/02/2012  . Pneumonia, organism unspecified(486) [J18.9] 11/02/2012  . Pleural effusion [J90] 11/02/2012  . Hypoxemia [R09.02] 11/02/2012  . HCAP (healthcare-associated pneumonia) [J18.9] 10/31/2012  . Anemia [D64.9] 10/31/2012  . Osteoarthritis of right hip [M16.11] 11/28/2011  . Right hip pain [M25.551] 07/22/2011  . Bilateral hip pain [M25.551, M25.552] 07/22/2011  .  Low back pain [M54.5] 05/20/2011  . Testicular pain, right [N50.811] 05/20/2011  . Erectile dysfunction [N52.9] 02/17/2011  . Constipation - functional [K59.04] 02/17/2011  . Diabetes type 2, uncontrolled ([place]) [E11.65] 11/06/2010  . Hypogonadism male [E29.1] 05/20/2010  . TOBACCO USER [F17.200] 05/20/2010  . Demoralization and apathy [R45.3] 05/20/2010  . Bipolar disorder ([place]) [F31.9] 01/18/2010  . Depression [F32.9] 01/18/2010  . MICROSCOPIC HEMATURIA [R31.29] 01/18/2010  . SHOULDER PAIN [M25.519] 01/18/2010  . Anxiety state [F41.1] 12/05/2006  . Essential hypertension [I10] 12/05/2006    Total Time spent with patient: 20 minutes  Subjective:   [name] is a 51 y.o. male patient admitted with acute metabolic encephalopathy. Patient has a history of bipolar disorder, PTSD and substance use disorder in the past  HPI: Patient has been followed up by psychiatry over the last few days, continues to be tangential, answers questions occasionally, will follow sleep at times, is distracted and confused. Patient states he lives with her sister, reports he is okay but is unable to answer questions or elaborate. He is oriented to self but cannot tell you where he is, what year it is.  Past Psychiatric History: History of bipolar disorder, PTSD, substance use disorder in the past. Patient also has history of 2 suicide attempts 1 a year ago in another 5 years ago  Risk to Self: Is patient at risk for suicide?: Yes Risk to Others:   Prior Inpatient Therapy:  history of 2 previous psychiatric hospitalizations Prior Outpatient Therapy:    Past Medical History:  Past Medical History:  Diagnosis Date  . ADD (attention deficit disorder)   . Anxiety   . Arthritis    right hip  . Bipolar 1 disorder ([place])   . Blood in urine   .  CKD (chronic kidney disease), stage III   . Creatinine elevation   . Depression    bipolar [facility]  . Diabetes mellitus without complication ([place])   .  Family history of anesthesia complication    pt is unsure , but pt father may have been difficult to arouse   . History of kidney stones   . Hypertension   . Hypogonadism male   . Liver fatty degeneration   . Microscopic hematuria    hereditary s/p Urology eval  . Pneumonia 10-2012  . Polysubstance dependence, non-opioid, in remission ([place])    remote  . PTSD (post-traumatic stress disorder)    SOCIAL ANXIETY DISORDER     Past Surgical History:  Procedure Laterality Date  . BACK SURGERY    . CLOSED REDUCTION METACARPAL WITH PERCUTANEOUS PINNING Right   . LUMBAR DISC SURGERY    . TONSILLECTOMY    . TOTAL HIP ARTHROPLASTY Right 08/16/2013   Procedure: TOTAL HIP ARTHROPLASTY ANTERIOR APPROACH;  Surgeon: Hessie Dibble, MD;  Location: [facility];  Service: Orthopedics;  Laterality: Right;  . TOTAL HIP ARTHROPLASTY Left 05/22/2015   Procedure: TOTAL HIP ARTHROPLASTY ANTERIOR APPROACH;  Surgeon: Melrose Nakayama, MD;  Location: [facility];  Service: Orthopedics;  Laterality: Left;   Family History:  Family History  Problem Relation Age of Onset  . Diabetes Father   . Cancer Mother        died of melanoma with mets  . Cervical cancer Sister   . Diabetes Sister   . Other Neg Hx        hypogonadism   Family Psychiatric  History: Maternal grandfather was diagnosed with bipolar disorder and completed suicide. Mom is diagnosed with bipolar disorder and an uncle has Parkinson's disease Social History:  History  Alcohol Use No     History  Drug Use No    Social History   Social History  . Marital status: Single    Spouse name: N/A  . Number of children: N/A  . Years of education: N/A   Occupational History  . [employer]   Social History Main Topics  . Smoking status: Former Smoker    Years: 0.00    Types: Cigarettes    Quit date: 02/19/2014  . Smokeless tobacco: Never Used  . Alcohol use No  . Drug use: No  . Sexual activity: Not Currently   Other Topics  Concern  . None   Social History Narrative   regualar exercise-no   Additional Social History:    Allergies:   Allergies  Allergen Reactions  . Vicodin [Hydrocodone-Acetaminophen] Itching    Labs:  Results for orders placed or performed during the hospital encounter of 09/07/16 (from the past 48 hour(s))  Glucose, capillary     Status: Abnormal   Collection Time: 09/14/16  5:07 PM  Result Value Ref Range   Glucose-Capillary 143 (H) 65 - 99 mg/dL  Glucose, capillary     Status: Abnormal   Collection Time: 09/14/16 10:14 PM  Result Value Ref Range   Glucose-Capillary 145 (H) 65 - 99 mg/dL  Glucose, capillary     Status: Abnormal   Collection Time: 09/15/16  8:26 AM  Result Value Ref Range   Glucose-Capillary 188 (H) 65 - 99 mg/dL  Glucose, capillary     Status: Abnormal   Collection Time: 09/15/16 12:17 PM  Result Value Ref Range   Glucose-Capillary 196 (H) 65 - 99 mg/dL  Glucose, capillary  Status: Abnormal   Collection Time: 09/15/16  5:10 PM  Result Value Ref Range   Glucose-Capillary 217 (H) 65 - 99 mg/dL  Glucose, capillary     Status: Abnormal   Collection Time: 09/15/16 10:28 PM  Result Value Ref Range   Glucose-Capillary 253 (H) 65 - 99 mg/dL  CBC with Differential/Platelet     Status: Abnormal   Collection Time: 09/15/16 10:40 PM  Result Value Ref Range   WBC 13.0 (H) 4.0 - 10.5 K/uL   RBC 4.83 4.22 - 5.81 MIL/uL   Hemoglobin 14.8 13.0 - 17.0 g/dL   HCT 43.1 39.0 - 52.0 %   MCV 89.2 78.0 - 100.0 fL   MCH 30.6 26.0 - 34.0 pg   MCHC 34.3 30.0 - 36.0 g/dL   RDW 12.7 11.5 - 15.5 %   Platelets 109 (L) 150 - 400 K/uL    Comment: CONSISTENT WITH PREVIOUS RESULT   Neutrophils Relative % 70 %   Neutro Abs 9.1 (H) 1.7 - 7.7 K/uL   Lymphocytes Relative 11 %   Lymphs Abs 1.4 0.7 - 4.0 K/uL   Monocytes Relative 19 %   Monocytes Absolute 2.5 (H) 0.1 - 1.0 K/uL   Eosinophils Relative 0 %   Eosinophils Absolute 0.0 0.0 - 0.7 K/uL   Basophils Relative 0 %    Basophils Absolute 0.0 0.0 - 0.1 K/uL  Lactic acid, plasma     Status: None   Collection Time: 09/15/16 10:40 PM  Result Value Ref Range   Lactic Acid, Venous 1.2 0.5 - 1.9 mmol/L  Urinalysis, Routine w reflex microscopic     Status: Abnormal   Collection Time: 09/15/16 11:22 PM  Result Value Ref Range   Color, Urine YELLOW YELLOW   APPearance CLOUDY (A) CLEAR   Specific Gravity, Urine 1.021 1.005 - 1.030   pH 6.0 5.0 - 8.0   Glucose, UA >=500 (A) NEGATIVE mg/dL   Hgb urine dipstick SMALL (A) NEGATIVE   Bilirubin Urine NEGATIVE NEGATIVE   Ketones, ur 5 (A) NEGATIVE mg/dL   Protein, ur 30 (A) NEGATIVE mg/dL   Nitrite NEGATIVE NEGATIVE   Leukocytes, UA LARGE (A) NEGATIVE   RBC / HPF 0-5 0 - 5 RBC/hpf   WBC, UA TOO NUMEROUS TO COUNT 0 - 5 WBC/hpf   Bacteria, UA RARE (A) NONE SEEN   Squamous Epithelial / LPF 0-5 (A) NONE SEEN  Glucose, capillary     Status: Abnormal   Collection Time: 09/16/16  7:56 AM  Result Value Ref Range   Glucose-Capillary 189 (H) 65 - 99 mg/dL  CBC     Status: Abnormal   Collection Time: 09/16/16  8:00 AM  Result Value Ref Range   WBC 13.4 (H) 4.0 - 10.5 K/uL   RBC 4.57 4.22 - 5.81 MIL/uL   Hemoglobin 14.0 13.0 - 17.0 g/dL   HCT 40.7 39.0 - 52.0 %   MCV 89.1 78.0 - 100.0 fL   MCH 30.6 26.0 - 34.0 pg   MCHC 34.4 30.0 - 36.0 g/dL   RDW 12.7 11.5 - 15.5 %   Platelets 92 (L) 150 - 400 K/uL    Comment: CONSISTENT WITH PREVIOUS RESULT  Basic metabolic panel     Status: Abnormal   Collection Time: 09/16/16  8:00 AM  Result Value Ref Range   Sodium 134 (L) 135 - 145 mmol/L   Potassium 4.5 3.5 - 5.1 mmol/L   Chloride 106 101 - 111 mmol/L   CO2 19 (L) 22 -  32 mmol/L   Glucose, Bld 212 (H) 65 - 99 mg/dL   BUN 30 (H) 6 - 20 mg/dL   Creatinine, Ser 2.01 (H) 0.61 - 1.24 mg/dL   Calcium 9.1 8.9 - 10.3 mg/dL   GFR calc non Af Amer 38 (L) >60 mL/min   GFR calc Af Amer 43 (L) >60 mL/min    Comment: (NOTE) The eGFR has been calculated using the CKD EPI  equation. This calculation has not been validated in all clinical situations. eGFR's persistently <60 mL/min signify possible Chronic Kidney Disease.    Anion gap 9 5 - 15  Glucose, capillary     Status: Abnormal   Collection Time: 09/16/16 12:00 PM  Result Value Ref Range   Glucose-Capillary 152 (H) 65 - 99 mg/dL  Glucose, capillary     Status: Abnormal   Collection Time: 09/16/16 12:42 PM  Result Value Ref Range   Glucose-Capillary 183 (H) 65 - 99 mg/dL   Comment 1 Notify RN     Current Facility-Administered Medications  Medication Dose Route Frequency Provider Last Rate Last Dose  . acetaminophen (TYLENOL) tablet 650 mg  650 mg Oral Q6H PRN Rise Patience, MD   650 mg at 09/15/16 2252  . cefTRIAXone (ROCEPHIN) 1 g in dextrose 5 % 50 mL IVPB  1 g Intravenous Q24H Rosita Fire, MD   Stopped at 09/16/16 1202  . divalproex (DEPAKOTE) DR tablet 1,500 mg  1,500 mg Oral QHS Ivor Costa, MD   1,500 mg at 09/15/16 2223  . enoxaparin (LOVENOX) injection 40 mg  40 mg Subcutaneous Q24H Rosita Fire, MD   40 mg at 09/16/16 1250  . folic acid (FOLVITE) tablet 1 mg  1 mg Oral Daily Amin, Ankit Chirag, MD   1 mg at 09/16/16 0901  . haloperidol lactate (HALDOL) injection 5 mg  5 mg Intramuscular Q6H PRN Rosita Fire, MD      . hydrALAZINE (APRESOLINE) injection 5 mg  5 mg Intravenous Q2H PRN Ivor Costa, MD      . insulin aspart (novoLOG) injection 0-9 Units  0-9 Units Subcutaneous TID WC Schorr, Rhetta Mura, NP   2 Units at 09/16/16 1250  . LORazepam (ATIVAN) injection 1-2 mg  1-2 mg Intravenous Q4H PRN Cherene Altes, MD   2 mg at 09/09/16 2136  . LORazepam (ATIVAN) tablet 0.5 mg  0.5 mg Oral BID Rosita Fire, MD   0.5 mg at 09/16/16 0901  . ondansetron (ZOFRAN) tablet 4 mg  4 mg Oral Q6H PRN Ivor Costa, MD       Or  . ondansetron [facility]) injection 4 mg  4 mg Intravenous Q6H PRN Ivor Costa, MD      . oxyCODONE-acetaminophen (PERCOCET/ROXICET) 5-325 MG  per tablet 1-2 tablet  1-2 tablet Oral Q4H PRN Ivor Costa, MD      . propranolol (INDERAL) tablet 20 mg  20 mg Oral BID Ivor Costa, MD   20 mg at 09/15/16 2224  . QUEtiapine (SEROQUEL) tablet 200 mg  200 mg Oral QHS Amin, Ankit Chirag, MD   200 mg at 09/15/16 2224  . sodium chloride flush (NS) 0.9 % injection 3 mL  3 mL Intravenous Q12H Ivor Costa, MD   3 mL at 09/16/16 1141  . tamsulosin (FLOMAX) capsule 0.4 mg  0.4 mg Oral Daily Rosita Fire, MD   0.4 mg at 09/16/16 0901  . testosterone (ANDROGEL) 50 MG/5GM (1%) gel 5 g  5 g Transdermal Daily Carolin Sicks,  Osie Bond, MD   5 g at 09/16/16 0901  . traZODone (DESYREL) tablet 100 mg  100 mg Oral QHS Amin, Ankit Chirag, MD   100 mg at 09/15/16 2224    Musculoskeletal: Strength & Muscle Tone: within normal limits Gait & Station: unable to stand Patient leans: N/A  Psychiatric Specialty Exam: Physical Exam  ROS  Blood pressure 134/67, pulse 79, temperature 97.8 F (36.6 C), temperature source Oral, resp. rate 18, height 6' 4"  (1.93 m), weight 97.1 kg (214 lb 1.1 oz), SpO2 98 %.Body mass index is 26.06 kg/m.  General Appearance: Disheveled  Eye Contact:  Minimal  Speech:  Blocked  Volume:  Decreased  Mood:  Dysphoric  Affect:  Congruent and Restricted  Thought Process:  Disorganized and Descriptions of Associations: Tangential  Orientation:  Other:  self  Thought Content:  Illogical and Tangential  Suicidal Thoughts:  No  Homicidal Thoughts:  No  Memory:  Immediate;   Poor Recent;   Poor Remote;   Poor  Judgement:  Impaired  Insight:  Lacking  Psychomotor Activity:  Mannerisms  Concentration:  Concentration: Poor and Attention Span: Poor  Recall:  Poor  Fund of Knowledge:  NA  Language:  Poor  Akathisia:  No  Handed:  Right  AIMS (if indicated):     Assets:  Housing Social Support  ADL's:  Impaired  Cognition:  Impaired,  Moderate  Sleep:        Treatment Plan Summary: Plan Psychiatry consult service to follow  patient Continue Seroquel to 200 mg QHS Continue Trazodone to 100 mg QHS Continue Ativan 0.5 mg twice a day Discontinue when necessary Ativan as it can worsen patient's confusion. Continue Haldol 5 mg every 6 when necessary agitation Continue Depakote 1500 mg QHS, monitor for the valproic acid level Patient would benefit from an ammonia level being done Disposition: Recommend psychiatric Inpatient admission when medically cleared.  Hampton Abbot, MD 09/16/2016 4:38 PM

## 2016-09-16 NOTE — Progress Notes (Signed)
PROGRESS NOTE    [name]  OQH:[id number] DOB: Feb 17, 1966 DOA: 09/07/2016 PCP: Beverly Sessions   Brief Narrative: 51 y.o.malewith history significant of PTSD, bipolar disorder, ADD, depression, anxiety, GERD, CKD stage III, seizure d/o, HTN, HLD, and DM who presentedwith altered mental status. The patient's sister reported he had been cognitively declining for the past year w/ multiple falls at home. He has not been compliant w/ his medications. He had recentlybecome more confused and restless, reporting auditory and visual hallucinations. CT head in the ED was w/o acute findings.   Assessment & Plan:   # Altered mental status likely acute metabolic encephalopathy versus medication related versus chronic cognitive decline: CT scan of head showed mildly advanced cerebral atrophy with minimal chronic ischemic changes. No acute finding. -TSH, vitamin T46, folic acid level acceptable. HIV and RPR nonreactive. -Already evaluated by psychiatrist and adjusted medications. They recommended neurology consult. Evaluated by neurologist and obtained MRI of the brain and spine with no acute finding. Neurologist discontinued Truvada as there is no clear indication for this medication.  -Mental status is gradually improving. Taper Ativan to twice a day. Indwelling Foley catheter was removed  and able to urinate without any difficulties.   # Fever, leukocytosis and urine consistent with urinary tract infection likely due to recent Foley catheter insertion: Start empiric IV ceftriaxone. Ordered urine culture. Follow-up blood culture result. Monitor labs.  # Acute kidney injury on CKD stage III -Serum creatinine level is stable. Continue to monitor. Avoid neurotoxin.  # Acute urinary retention: Discontinued indwelling Foley catheter. Continue Flomax.  # PTSD/anxiety/attention deficit disorder/bipolar disorde, Suicidal ideation -Medications adjusted by psychiatrist. Continue suicide precautions.   Psychiatrist recommended inpatient psych admission. Patient is medically stable to transfer his care to inpatient psych. He still has low mood. Social worker following for inpatient rehabilitation discharge. Continue current management. -Taper Ativan 0.5 twice a day. -Waiting for inpatient psychiatric floor bed. Social worker eval is ongoing.  #History of seizure disorder -Continue Depakote. Ativan as needed. No seizure-like activity. Neurology consult appreciated.  # Hypertension -Blood pressure low therefore discontinued Norvasc. Continue propranolol if can tolerate. Monitor heart rate and blood pressure. Discussed with the nurse.  # Hypokalemia: Improved. Magnesium level acceptable.  #Chronic thrombocytopenia: No sign of bleeding. Monitor platelet counts.   Principal Problem:   Acute metabolic encephalopathy Active Problems:   Bipolar disorder ([place])   Anxiety state   Depression   Essential hypertension   Diabetes type 2, uncontrolled (HCC)   Hypokalemia   Seizures (HCC)   HLD (hyperlipidemia)   PTSD (post-traumatic stress disorder)   Acute encephalopathy   CRI (chronic renal insufficiency), stage 3 (moderate)   Fall   Acute urinary retention  DVT prophylaxis: Lovenox subcutaneous Code Status: Full code Family Communication: No family at bedside Disposition Plan: Likely discharge to inpatient psych vs SNF in 1-2 days when bed is available.     Consultants:   Psychiatrist  Procedures: None Antimicrobials: None  Subjective: Seen and examined at bedside. Reported depressed mood. Also has dysuria and urgency. The headache, dizziness, chest pressure shortness of breath.  Objective: Vitals:   09/15/16 2224 09/16/16 0034 09/16/16 0603 09/16/16 0901  BP: 111/85  130/80 (!) 106/57  Pulse: 82  78 75  Resp:   19   Temp: (!) 102.9 F (39.4 C) 99.9 F (37.7 C) 99 F (37.2 C) 98.7 F (37.1 C)  TempSrc: Oral Oral Oral Oral  SpO2: 98%  98% 100%  Weight:  Height:        Intake/Output Summary (Last 24 hours) at 09/16/16 1218 Last data filed at 09/16/16 1212  Gross per 24 hour  Intake              603 ml  Output              850 ml  Net             -247 ml   Filed Weights   09/10/16 1029 09/15/16 0517  Weight: 94.4 kg (208 lb 1.8 oz) 97.1 kg (214 lb 1.1 oz)    Examination:  General exam: Lying on bed comfortable Respiratory system: Clear bilaterally, respiratory effort normal, no wheezing or crackle  Cardiovascular system: Regular rate rhythm, S1 is normal. No pedal edema. Gastrointestinal system: Abdomen is nondistended, soft and nontender. Normal bowel sounds heard. Central nervous system: Following commands, oriented, nonfocal Extremities: Symmetric 5 x 5 power. Skin: No rashes, lesions or ulcers Psychiatry: Depressed mood with intermittent suicidal ideation.  Data Reviewed: I have personally reviewed following labs and imaging studies  CBC:  Recent Labs Lab 09/10/16 0752 09/15/16 2240 09/16/16 0800  WBC 5.3 13.0* 13.4*  NEUTROABS  --  9.1*  --   HGB 13.6 14.8 14.0  HCT 40.3 43.1 40.7  MCV 89.4 89.2 89.1  PLT 81* 109* 92*   Basic Metabolic Panel:  Recent Labs Lab 09/10/16 0752 09/16/16 0800  NA 139 134*  K 3.8 4.5  CL 108 106  CO2 25 19*  GLUCOSE 126* 212*  BUN 21* 30*  CREATININE 1.99* 2.01*  CALCIUM 8.8* 9.1   GFR: Estimated Creatinine Clearance: 55.2 mL/min (A) (by C-G formula based on SCr of 2.01 mg/dL (H)). Liver Function Tests: No results for input(s): AST, ALT, ALKPHOS, BILITOT, PROT, ALBUMIN in the last 168 hours. No results for input(s): LIPASE, AMYLASE in the last 168 hours. No results for input(s): AMMONIA in the last 168 hours. Coagulation Profile: No results for input(s): INR, PROTIME in the last 168 hours. Cardiac Enzymes: No results for input(s): CKTOTAL, CKMB, CKMBINDEX, TROPONINI in the last 168 hours. BNP (last 3 results) No results for input(s): PROBNP in the last 8760  hours. HbA1C: No results for input(s): HGBA1C in the last 72 hours. CBG:  Recent Labs Lab 09/15/16 0826 09/15/16 1217 09/15/16 1710 09/15/16 2228 09/16/16 0756  GLUCAP 188* 196* 217* 253* 189*   Lipid Profile: No results for input(s): CHOL, HDL, LDLCALC, TRIG, CHOLHDL, LDLDIRECT in the last 72 hours. Thyroid Function Tests: No results for input(s): TSH, T4TOTAL, FREET4, T3FREE, THYROIDAB in the last 72 hours. Anemia Panel: No results for input(s): VITAMINB12, FOLATE, FERRITIN, TIBC, IRON, RETICCTPCT in the last 72 hours. Sepsis Labs:  Recent Labs Lab 09/15/16 2240  LATICACIDVEN 1.2    Recent Results (from the past 240 hour(s))  MRSA PCR Screening     Status: None   Collection Time: 09/08/16  3:22 AM  Result Value Ref Range Status   MRSA by PCR NEGATIVE NEGATIVE Final    Comment:        The GeneXpert MRSA Assay (FDA approved for NASAL specimens only), is one component of a comprehensive MRSA colonization surveillance program. It is not intended to diagnose MRSA infection nor to guide or monitor treatment for MRSA infections.          Radiology Studies: Dg Chest Port 1 View  Result Date: 09/15/2016 CLINICAL DATA:  51 year old male with fever and chest pain. EXAM: PORTABLE CHEST 1 VIEW COMPARISON:  Chest radiograph dated 09/07/2016 FINDINGS: The heart size and mediastinal contours are within normal limits. Both lungs are clear. The visualized skeletal structures are unremarkable. IMPRESSION: No active disease. Electronically Signed   By: Anner Crete M.D.   On: 09/15/2016 22:54        Scheduled Meds: . divalproex  1,500 mg Oral QHS  . enoxaparin (LOVENOX) injection  40 mg Subcutaneous Q24H  . folic acid  1 mg Oral Daily  . insulin aspart  0-9 Units Subcutaneous TID WC  . LORazepam  0.5 mg Oral BID  . propranolol  20 mg Oral BID  . QUEtiapine  200 mg Oral QHS  . sodium chloride flush  3 mL Intravenous Q12H  . tamsulosin  0.4 mg Oral Daily  .  testosterone  5 g Transdermal Daily  . traZODone  100 mg Oral QHS   Continuous Infusions: . cefTRIAXone (ROCEPHIN)  IV Stopped (09/16/16 1202)     LOS: 9 days    Bing Duffey Tanna Furry, MD [facility] Pager [phone]  If 7PM-7AM, please contact night-coverage [email or web address] Password [email or web address] 09/16/2016, 12:18 PM

## 2016-09-16 NOTE — Progress Notes (Signed)
Inpatient Diabetes Program Recommendations  AACE/ADA: New Consensus Statement on Inpatient Glycemic Control (2015)  Target Ranges:  Prepandial:   less than 140 mg/dL      Peak postprandial:   less than 180 mg/dL (1-2 hours)      Critically ill patients:  140 - 180 mg/dL   Results for MIRANDA, [name] (MRN [id number]) as of 09/16/2016 10:32  Ref. Range 09/15/2016 08:26 09/15/2016 12:17 09/15/2016 17:10 09/15/2016 22:28 09/16/2016 07:56  Glucose-Capillary Latest Ref Range: 65 - 99 mg/dL 188 (H) 196 (H) 217 (H) 253 (H) 189 (H)   Review of Glycemic Control  Diabetes history: DM 2 Outpatient Diabetes medications: Humalog 10 units tid meal coverage Current orders for Inpatient glycemic control: Novolog Sensitive 0-9 units tid  Inpatient Diabetes Program Recommendations:    Patient takes meal coverage at home. Glucose trends increases at meal times. Consider Novolog 4 units tid meal coverage if patient consumes at least 50% of meals.  Consider changing diet to carb modified.   Thanks,  Tama Headings RN, MSN, [facility] Inpatient Diabetes Coordinator Team Pager [phone] (8a-5p)

## 2016-09-16 NOTE — Progress Notes (Signed)
Occupational Therapy Treatment Patient Details Name: [name] MRN: [id number] DOB: November 29, 1965 Today's Date: 09/16/2016    History of present illness 51 y.o.malewith history significant of PTSD, bipolar disorder, ADD, depression, anxiety, GERD, CKD stage III, seizure d/o, HTN, HLD, and DM who presentedwith altered mental status. The patient's sister reported he had been cognitively declining for the past year w/ multiple falls at home. Non - compliant w/ his medications per family, more confused and restless, reporting auditory and visual hallucinations. CT head in the ED was w/o acute findings.    OT comments  Pt continues to demonstrate impaired cognition. Requires verbal and tactile cues for bed mobility. Demonstrates poor safety awareness. Pt assisted to EOB in preparation for ADL, but impulsively returned to supine, stating he was too fatigued to continues. Will continue to follow.  Follow Up Recommendations  SNF;Supervision/Assistance - 24 hour    Equipment Recommendations       Recommendations for Other Services      Precautions / Restrictions Precautions Precautions: Fall Restrictions Weight Bearing Restrictions: No       Mobility Bed Mobility Overal bed mobility: Needs Assistance Bed Mobility: Supine to Sit;Sit to Supine     Supine to sit: HOB elevated;Min guard Sit to supine: Min guard   General bed mobility comments: Pt cued for sequencing, min guard assist for safety.  Transfers                      Balance     Sitting balance-Leahy Scale: Fair                                     ADL either performed or assessed with clinical judgement   ADL                                         General ADL Comments: Pt transitioned to EOB for grooming. No LOB in sitting, but returned to supine impulsively, reporting he was fatigued.     Vision       Perception     Praxis      Cognition Arousal/Alertness:  Awake/alert Behavior During Therapy: Impulsive Overall Cognitive Status: Impaired/Different from baseline Area of Impairment: Attention;Following commands;Problem solving;Orientation                 Orientation Level: Disoriented to;Situation;Time Current Attention Level: Sustained   Following Commands: Follows one step commands with increased time (verbal and tactile cues) Safety/Judgement: Decreased awareness of safety;Decreased awareness of deficits   Problem Solving: Slow processing;Requires verbal cues General Comments: pt without ability to complete a thought, tangential        Exercises     Shoulder Instructions       General Comments      Pertinent Vitals/ Pain       Pain Assessment: Faces Faces Pain Scale: Hurts a little bit Pain Location: L flank Pain Descriptors / Indicators: Discomfort Pain Intervention(s): Monitored during session;Repositioned  Home Living                                          Prior Functioning/Environment              Frequency  Min 2X/week        Progress Toward Goals  OT Goals(current goals can now be found in the care plan section)  Progress towards OT goals: Not progressing toward goals - comment (fatigue)  Acute Rehab OT Goals Patient Stated Goal: to sleep OT Goal Formulation: With patient Time For Goal Achievement: 09/30/16 Potential to Achieve Goals: [place] Discharge plan remains appropriate    Co-evaluation                 AM-PAC PT "6 Clicks" Daily Activity     Outcome Measure   Help from another person eating meals?: None Help from another person taking care of personal grooming?: A Little Help from another person toileting, which includes using toliet, bedpan, or urinal?: A Lot Help from another person bathing (including washing, rinsing, drying)?: A Lot Help from another person to put on and taking off regular upper body clothing?: A Little Help from another person  to put on and taking off regular lower body clothing?: A Lot 6 Click Score: 16    End of Session    OT Visit Diagnosis: Unsteadiness on feet (R26.81);Other abnormalities of gait and mobility (R26.89);Repeated falls (R29.6);Muscle weakness (generalized) (M62.81);History of falling (Z91.81);Other symptoms and signs involving cognitive function   Activity Tolerance     Patient Left in bed;with call bell/phone within reach;with nursing/sitter in room   Nurse Communication          Time: 1062-6948 OT Time Calculation (min): 19 min  Charges: OT General Charges $OT Visit: 1 Procedure OT Treatments $Self Care/Home Management : 8-22 mins   Malka So 09/16/2016, 10:53 AM  [phone]

## 2016-09-17 LAB — CBC
HEMATOCRIT: 40.9 % (ref 39.0–52.0)
HEMOGLOBIN: 14.2 g/dL (ref 13.0–17.0)
MCH: 31.3 pg (ref 26.0–34.0)
MCHC: 34.7 g/dL (ref 30.0–36.0)
MCV: 90.3 fL (ref 78.0–100.0)
Platelets: 97 10*3/uL — ABNORMAL LOW (ref 150–400)
RBC: 4.53 MIL/uL (ref 4.22–5.81)
RDW: 12.8 % (ref 11.5–15.5)
WBC: 11.5 10*3/uL — AB (ref 4.0–10.5)

## 2016-09-17 LAB — GLUCOSE, CAPILLARY
GLUCOSE-CAPILLARY: 237 mg/dL — AB (ref 65–99)
GLUCOSE-CAPILLARY: 181 mg/dL — AB (ref 65–99)
GLUCOSE-CAPILLARY: 166 mg/dL — AB (ref 65–99)
Glucose-Capillary: 160 mg/dL — ABNORMAL HIGH (ref 65–99)

## 2016-09-17 LAB — AMMONIA: Ammonia: 30 umol/L (ref 9–35)

## 2016-09-17 NOTE — Progress Notes (Signed)
PROGRESS NOTE    [name]  XHB:[id number] DOB: Feb 11, 1966 DOA: 09/07/2016 PCP: Beverly Sessions   Brief Narrative: 51 y.o.malewith history significant of PTSD, bipolar disorder, ADD, depression, anxiety, GERD, CKD stage III, seizure d/o, HTN, HLD, and DM who presentedwith altered mental status. The patient's sister reported he had been cognitively declining for the past year w/ multiple falls at home. He has not been compliant w/ his medications. He had recentlybecome more confused and restless, reporting auditory and visual hallucinations. CT head in the ED was w/o acute findings.   Assessment & Plan:   # Altered mental status likely acute metabolic encephalopathy versus medication related versus chronic cognitive decline: CT scan of head showed mildly advanced cerebral atrophy with minimal chronic ischemic changes. No acute finding. -TSH, vitamin Y10, folic acid level acceptable. HIV and RPR nonreactive. -Already evaluated by psychiatrist and adjusted medications. They recommended neurology consult. Evaluated by neurologist and obtained MRI of the brain and spine with no acute finding. Neurologist discontinued Truvada as there is no clear indication for this medication.  -Monitor mental status.   # Fever, leukocytosis and urine consistent with urinary tract infection likely due to recent Foley catheter insertion: Continue empiric IV ceftriaxone. Follow up blood and urine culture result. On today's supportive care.  # Acute kidney injury on CKD stage III -Serum creatinine level is stable. Continue to monitor. Avoid neurotoxin.  # Acute urinary retention: Discontinued indwelling Foley catheter. Continue Flomax. Currently patient has a condom catheter  # PTSD/anxiety/attention deficit disorder/bipolar disorde, Suicidal ideation -Medications adjusted by psychiatrist. Continue suicide precautions.  Psychiatrist recommended inpatient psych admission. -Patient was half naked and with mild  agitation this morning. Discussed with social worker regarding safe discharge to behavioral [place]. Follow-up psychiatrist's condition. Continue bed sitter. -Continue Ativan 0.5 twice a day. -Waiting for inpatient psychiatric floor bed. Social worker eval is ongoing.  #History of seizure disorder -Continue Depakote. Ativan as needed. No seizure-like activity. Neurology consult appreciated.  # Hypertension -Off Norvasc. Continue atenolol if tolerated. Monitor blood pressure and heart rate.  # Hypokalemia: Improved. Magnesium level acceptable.  #Chronic thrombocytopenia: No sign of bleeding. Monitor platelet counts.   Principal Problem:   Acute metabolic encephalopathy Active Problems:   Bipolar disorder ([place])   Anxiety state   Depression   Essential hypertension   Diabetes type 2, uncontrolled (HCC)   Hypokalemia   Seizures (HCC)   HLD (hyperlipidemia)   PTSD (post-traumatic stress disorder)   Acute encephalopathy   CRI (chronic renal insufficiency), stage 3 (moderate)   Fall   Acute urinary retention   Acute lower UTI  DVT prophylaxis: Lovenox subcutaneous Code Status: Full code Family Communication: No family at bedside Disposition Plan: Likely discharge to inpatient psych vs SNF in 1-2 days when bed is available.     Consultants:   Psychiatrist  Procedures: None Antimicrobials: None  Subjective: Seen and examined at bedside. He was mildly agitated this morning but later calm  down. Denied headache, dizziness, nausea, vomiting, chest pressure shortness of breath. He said he was angry because of the situation but unable to elaborate. Objective: Vitals:   09/16/16 0901 09/16/16 1359 09/16/16 2139 09/17/16 0651  BP: (!) 106/57 134/67 127/63 (!) 102/53  Pulse: 75 79 89 73  Resp:  18 16 20   Temp: 98.7 F (37.1 C) 97.8 F (36.6 C) 99.9 F (37.7 C) 98.4 F (36.9 C)  TempSrc: Oral Oral Oral Oral  SpO2: 100% 98% 97% 95%  Weight:  Height:         Intake/Output Summary (Last 24 hours) at 09/17/16 1043 Last data filed at 09/17/16 0933  Gross per 24 hour  Intake              290 ml  Output              701 ml  Net             -411 ml   Filed Weights   09/10/16 1029 09/15/16 0517  Weight: 94.4 kg (208 lb 1.8 oz) 97.1 kg (214 lb 1.1 oz)    Examination:  General exam:Lying on bed, half naked. Sitter at bedside trying to help Respiratory system: Clear bilateral, respiratory effort normal, no wheezing or crackle  Cardiovascular system: Regular rate rhythm, S1 is normal. No pedal edema Gastrointestinal system: Abdomen is nondistended, soft and nontender. Normal bowel sounds heard. Central nervous system: Following commands, oriented, nonfocal Extremities: Symmetric 5 x 5 power. Skin: No rashes, lesions or ulcers Psychiatry: Depressed mood with intermittent suicidal ideation and mild agitation this morning  Data Reviewed: I have personally reviewed following labs and imaging studies  CBC:  Recent Labs Lab 09/15/16 2240 09/16/16 0800 09/17/16 0528  WBC 13.0* 13.4* 11.5*  NEUTROABS 9.1*  --   --   HGB 14.8 14.0 14.2  HCT 43.1 40.7 40.9  MCV 89.2 89.1 90.3  PLT 109* 92* 97*   Basic Metabolic Panel:  Recent Labs Lab 09/16/16 0800  NA 134*  K 4.5  CL 106  CO2 19*  GLUCOSE 212*  BUN 30*  CREATININE 2.01*  CALCIUM 9.1   GFR: Estimated Creatinine Clearance: 55.2 mL/min (A) (by C-G formula based on SCr of 2.01 mg/dL (H)). Liver Function Tests: No results for input(s): AST, ALT, ALKPHOS, BILITOT, PROT, ALBUMIN in the last 168 hours. No results for input(s): LIPASE, AMYLASE in the last 168 hours.  Recent Labs Lab 09/17/16 0726  AMMONIA 30   Coagulation Profile: No results for input(s): INR, PROTIME in the last 168 hours. Cardiac Enzymes: No results for input(s): CKTOTAL, CKMB, CKMBINDEX, TROPONINI in the last 168 hours. BNP (last 3 results) No results for input(s): PROBNP in the last 8760  hours. HbA1C: No results for input(s): HGBA1C in the last 72 hours. CBG:  Recent Labs Lab 09/16/16 1200 09/16/16 1242 09/16/16 1723 09/16/16 2231 09/17/16 0833  GLUCAP 152* 183* 146* 233* 237*   Lipid Profile: No results for input(s): CHOL, HDL, LDLCALC, TRIG, CHOLHDL, LDLDIRECT in the last 72 hours. Thyroid Function Tests: No results for input(s): TSH, T4TOTAL, FREET4, T3FREE, THYROIDAB in the last 72 hours. Anemia Panel: No results for input(s): VITAMINB12, FOLATE, FERRITIN, TIBC, IRON, RETICCTPCT in the last 72 hours. Sepsis Labs:  Recent Labs Lab 09/15/16 2240  LATICACIDVEN 1.2    Recent Results (from the past 240 hour(s))  MRSA PCR Screening     Status: None   Collection Time: 09/08/16  3:22 AM  Result Value Ref Range Status   MRSA by PCR NEGATIVE NEGATIVE Final    Comment:        The GeneXpert MRSA Assay (FDA approved for NASAL specimens only), is one component of a comprehensive MRSA colonization surveillance program. It is not intended to diagnose MRSA infection nor to guide or monitor treatment for MRSA infections.          Radiology Studies: Dg Chest Port 1 View  Result Date: 09/15/2016 CLINICAL DATA:  51 year old male with fever and chest pain. EXAM: PORTABLE CHEST  1 VIEW COMPARISON:  Chest radiograph dated 09/07/2016 FINDINGS: The heart size and mediastinal contours are within normal limits. Both lungs are clear. The visualized skeletal structures are unremarkable. IMPRESSION: No active disease. Electronically Signed   By: Anner Crete M.D.   On: 09/15/2016 22:54        Scheduled Meds: . divalproex  1,500 mg Oral QHS  . enoxaparin (LOVENOX) injection  40 mg Subcutaneous Q24H  . folic acid  1 mg Oral Daily  . insulin aspart  0-9 Units Subcutaneous TID WC  . LORazepam  0.5 mg Oral BID  . propranolol  20 mg Oral BID  . QUEtiapine  200 mg Oral QHS  . sodium chloride flush  3 mL Intravenous Q12H  . tamsulosin  0.4 mg Oral Daily  .  testosterone  5 g Transdermal Daily  . traZODone  100 mg Oral QHS   Continuous Infusions: . cefTRIAXone (ROCEPHIN)  IV 1 g (09/17/16 0943)     LOS: 10 days    Osinachi Navarrette Tanna Furry, MD [facility] Pager [phone]  If 7PM-7AM, please contact night-coverage [email or web address] Password [email or web address] 09/17/2016, 10:43 AM

## 2016-09-17 NOTE — Consult Note (Signed)
[facility] Consult   Reason for Consult:  Agitation Referring Physician:  Hospitalist Patient Identification: [name] MRN:  [id number] Principal Diagnosis: Acute metabolic encephalopathy Diagnosis:   Patient Active Problem List   Diagnosis Date Noted  . Acute lower UTI [N39.0]   . Acute urinary retention [R33.8]   . Acute metabolic encephalopathy [J69.67] 09/07/2016  . HLD (hyperlipidemia) [E78.5] 09/07/2016  . GERD (gastroesophageal reflux disease) [K21.9] 09/07/2016  . PTSD (post-traumatic stress disorder) [F43.10] 09/07/2016  . Acute encephalopathy [G93.40] 09/07/2016  . Fall [W19.XXXA] 09/07/2016  . CRI (chronic renal insufficiency), stage 3 (moderate) [N18.3]   . Altered mental status [R41.82]   . Multiple drug overdose [T50.901A] 12/27/2015  . Suicidal overdose ([name]) [T50.902A] 12/26/2015  . Suicide attempt by multiple drug overdose ([name]) [T50.902A] 12/26/2015  . Ataxia [R27.0] 10/14/2015  . Slurring of speech [R47.81] 10/14/2015  . Leg weakness [R29.898] 10/03/2015  . CRF (chronic renal failure) [N18.9] 10/03/2015  . Hypokalemia [E87.6] 10/03/2015  . Hemiparesis ([name]) [G81.90]   . Seizures ([name]) [R56.9]   . Primary osteoarthritis of left hip [M16.12] 05/22/2015  . Exposure to potentially hazardous body fluids [Z77.21] 02/12/2015  . Loss of weight [R63.4] 09/04/2014  . Dyslipidemia [E78.5] 05/31/2014  . Left hip pain [M25.552] 02/13/2014  . Insomnia [G47.00] 11/11/2013  . Degenerative joint disease (DJD) of hip [M16.9] 08/16/2013  . Acute respiratory failure ([name]) [J96.00] 11/02/2012  . Pneumonia, organism unspecified(486) [J18.9] 11/02/2012  . Pleural effusion [J90] 11/02/2012  . Hypoxemia [R09.02] 11/02/2012  . HCAP (healthcare-associated pneumonia) [J18.9] 10/31/2012  . Anemia [D64.9] 10/31/2012  . Osteoarthritis of right hip [M16.11] 11/28/2011  . Right hip pain [M25.551] 07/22/2011  . Bilateral hip pain [M25.551, M25.552] 07/22/2011  .  Low back pain [M54.5] 05/20/2011  . Testicular pain, right [N50.811] 05/20/2011  . Erectile dysfunction [N52.9] 02/17/2011  . Constipation - functional [K59.04] 02/17/2011  . Diabetes type 2, uncontrolled ([name]) [E11.65] 11/06/2010  . Hypogonadism male [E29.1] 05/20/2010  . TOBACCO USER [F17.200] 05/20/2010  . Demoralization and apathy [R45.3] 05/20/2010  . Bipolar disorder ([name]) [F31.9] 01/18/2010  . Depression [F32.9] 01/18/2010  . MICROSCOPIC HEMATURIA [R31.29] 01/18/2010  . SHOULDER PAIN [M25.519] 01/18/2010  . Anxiety state [F41.1] 12/05/2006  . Essential hypertension [I10] 12/05/2006    Total Time spent with patient: 20 minutes  Subjective:   [name] is a 51 y.o. male patient admitted with acute metabolic encephalopathy. Patient has a history of bipolar disorder, PTSD and substance use disorder in the past. Pt seen and chart reviewed. Pt is delirious and unable to answer questions appropriately. He will attempt to answer some questions, but is somnolent and vague in his responses. He is also spontaneous and will attempt to stand up suddenly to use the restroom without regard to his safety. We had to redirect him many times during the assessment. When pt was redirected he did attempt to focus on the assessment and did appear to be making a genuine effort to do so. His presentation is more consistent with delirium rather than psychosis. He did clearly deny suicidal/homicidal ideation and psychosis, but he is clearly disoriented aside from self.   HPI: Patient has been followed up by psychiatry over the last few days, continues to be tangential, answers questions occasionally, will follow sleep at times, is distracted and confused. Patient states he lives with her sister, reports he is okay but is unable to answer questions or elaborate. He is oriented to self but cannot tell you where he is,  what year it is.  Interval History 09/17/16: Pt has been cooperative with staff with  intermittent spontaneous standing and walking with need for redirection. Staff report that he has not been combative, but has been irritable at times when presenting as confused.   Past Psychiatric History: History of bipolar disorder, PTSD, substance use disorder in the past. Patient also has history of 2 suicide attempts 1 a year ago in another 5 years ago  Risk to Self: Is patient at risk for suicide?: Yes Risk to Others:   Prior Inpatient Therapy:  history of 2 previous psychiatric hospitalizations Prior Outpatient Therapy:    Past Medical History:  Past Medical History:  Diagnosis Date  . ADD (attention deficit disorder)   . Anxiety   . Arthritis    right hip  . Bipolar 1 disorder ([name])   . Blood in urine   . CKD (chronic kidney disease), stage III   . Creatinine elevation   . Depression    bipolar [facility]  . Diabetes mellitus without complication ([place])   . Family history of anesthesia complication    pt is unsure , but pt father may have been difficult to arouse   . History of kidney stones   . Hypertension   . Hypogonadism male   . Liver fatty degeneration   . Microscopic hematuria    hereditary s/p Urology eval  . Pneumonia 10-2012  . Polysubstance dependence, non-opioid, in remission ([name])    remote  . PTSD (post-traumatic stress disorder)    SOCIAL ANXIETY DISORDER     Past Surgical History:  Procedure Laterality Date  . BACK SURGERY    . CLOSED REDUCTION METACARPAL WITH PERCUTANEOUS PINNING Right   . LUMBAR DISC SURGERY    . TONSILLECTOMY    . TOTAL HIP ARTHROPLASTY Right 08/16/2013   Procedure: TOTAL HIP ARTHROPLASTY ANTERIOR APPROACH;  Surgeon: Hessie Dibble, MD;  Location: [place];  Service: Orthopedics;  Laterality: Right;  . TOTAL HIP ARTHROPLASTY Left 05/22/2015   Procedure: TOTAL HIP ARTHROPLASTY ANTERIOR APPROACH;  Surgeon: Melrose Nakayama, MD;  Location: [place];  Service: Orthopedics;  Laterality: Left;   Family History:  Family History   Problem Relation Age of Onset  . Diabetes Father   . Cancer Mother        died of melanoma with mets  . Cervical cancer Sister   . Diabetes Sister   . Other Neg Hx        hypogonadism   Family Psychiatric  History: Maternal grandfather was diagnosed with bipolar disorder and completed suicide. Mom is diagnosed with bipolar disorder and an uncle has Parkinson's disease Social History:  History  Alcohol Use No     History  Drug Use No    Social History   Social History  . Marital status: Single    Spouse name: N/A  . Number of children: N/A  . Years of education: N/A   Occupational History  . [employer] [place]   Social History Main Topics  . Smoking status: Former Smoker    Years: 0.00    Types: Cigarettes    Quit date: 02/19/2014  . Smokeless tobacco: Never Used  . Alcohol use No  . Drug use: No  . Sexual activity: Not Currently   Other Topics Concern  . None   Social History Narrative   regualar exercise-no   Additional Social History:    Allergies:   Allergies  Allergen Reactions  . Vicodin [Hydrocodone-Acetaminophen] Itching  Labs:  Results for orders placed or performed during the hospital encounter of 09/07/16 (from the past 48 hour(s))  Glucose, capillary     Status: Abnormal   Collection Time: 09/15/16  5:10 PM  Result Value Ref Range   Glucose-Capillary 217 (H) 65 - 99 mg/dL  Glucose, capillary     Status: Abnormal   Collection Time: 09/15/16 10:28 PM  Result Value Ref Range   Glucose-Capillary 253 (H) 65 - 99 mg/dL  CBC with Differential/Platelet     Status: Abnormal   Collection Time: 09/15/16 10:40 PM  Result Value Ref Range   WBC 13.0 (H) 4.0 - 10.5 K/uL   RBC 4.83 4.22 - 5.81 MIL/uL   Hemoglobin 14.8 13.0 - 17.0 g/dL   HCT 43.1 39.0 - 52.0 %   MCV 89.2 78.0 - 100.0 fL   MCH 30.6 26.0 - 34.0 pg   MCHC 34.3 30.0 - 36.0 g/dL   RDW 12.7 11.5 - 15.5 %   Platelets 109 (L) 150 - 400 K/uL    Comment: CONSISTENT WITH  PREVIOUS RESULT   Neutrophils Relative % 70 %   Neutro Abs 9.1 (H) 1.7 - 7.7 K/uL   Lymphocytes Relative 11 %   Lymphs Abs 1.4 0.7 - 4.0 K/uL   Monocytes Relative 19 %   Monocytes Absolute 2.5 (H) 0.1 - 1.0 K/uL   Eosinophils Relative 0 %   Eosinophils Absolute 0.0 0.0 - 0.7 K/uL   Basophils Relative 0 %   Basophils Absolute 0.0 0.0 - 0.1 K/uL  Lactic acid, plasma     Status: None   Collection Time: 09/15/16 10:40 PM  Result Value Ref Range   Lactic Acid, Venous 1.2 0.5 - 1.9 mmol/L  Culture, blood (routine x 2)     Status: None (Preliminary result)   Collection Time: 09/15/16 10:45 PM  Result Value Ref Range   Specimen Description BLOOD RIGHT ARM    Special Requests IN PEDIATRIC BOTTLE Blood Culture adequate volume    Culture NO GROWTH 1 DAY    Report Status PENDING   Culture, blood (routine x 2)     Status: None (Preliminary result)   Collection Time: 09/15/16 10:59 PM  Result Value Ref Range   Specimen Description BLOOD LEFT ARM    Special Requests      BOTTLES DRAWN AEROBIC AND ANAEROBIC Blood Culture adequate volume   Culture NO GROWTH 1 DAY    Report Status PENDING   Urinalysis, Routine w reflex microscopic     Status: Abnormal   Collection Time: 09/15/16 11:22 PM  Result Value Ref Range   Color, Urine YELLOW YELLOW   APPearance CLOUDY (A) CLEAR   Specific Gravity, Urine 1.021 1.005 - 1.030   pH 6.0 5.0 - 8.0   Glucose, UA >=500 (A) NEGATIVE mg/dL   Hgb urine dipstick SMALL (A) NEGATIVE   Bilirubin Urine NEGATIVE NEGATIVE   Ketones, ur 5 (A) NEGATIVE mg/dL   Protein, ur 30 (A) NEGATIVE mg/dL   Nitrite NEGATIVE NEGATIVE   Leukocytes, UA LARGE (A) NEGATIVE   RBC / HPF 0-5 0 - 5 RBC/hpf   WBC, UA TOO NUMEROUS TO COUNT 0 - 5 WBC/hpf   Bacteria, UA RARE (A) NONE SEEN   Squamous Epithelial / LPF 0-5 (A) NONE SEEN  Glucose, capillary     Status: Abnormal   Collection Time: 09/16/16  7:56 AM  Result Value Ref Range   Glucose-Capillary 189 (H) 65 - 99 mg/dL  CBC  Status: Abnormal   Collection Time: 09/16/16  8:00 AM  Result Value Ref Range   WBC 13.4 (H) 4.0 - 10.5 K/uL   RBC 4.57 4.22 - 5.81 MIL/uL   Hemoglobin 14.0 13.0 - 17.0 g/dL   HCT 40.7 39.0 - 52.0 %   MCV 89.1 78.0 - 100.0 fL   MCH 30.6 26.0 - 34.0 pg   MCHC 34.4 30.0 - 36.0 g/dL   RDW 12.7 11.5 - 15.5 %   Platelets 92 (L) 150 - 400 K/uL    Comment: CONSISTENT WITH PREVIOUS RESULT  Basic metabolic panel     Status: Abnormal   Collection Time: 09/16/16  8:00 AM  Result Value Ref Range   Sodium 134 (L) 135 - 145 mmol/L   Potassium 4.5 3.5 - 5.1 mmol/L   Chloride 106 101 - 111 mmol/L   CO2 19 (L) 22 - 32 mmol/L   Glucose, Bld 212 (H) 65 - 99 mg/dL   BUN 30 (H) 6 - 20 mg/dL   Creatinine, Ser 2.01 (H) 0.61 - 1.24 mg/dL   Calcium 9.1 8.9 - 10.3 mg/dL   GFR calc non Af Amer 38 (L) >60 mL/min   GFR calc Af Amer 43 (L) >60 mL/min    Comment: (NOTE) The eGFR has been calculated using the CKD EPI equation. This calculation has not been validated in all clinical situations. eGFR's persistently <60 mL/min signify possible Chronic Kidney Disease.    Anion gap 9 5 - 15  Glucose, capillary     Status: Abnormal   Collection Time: 09/16/16 12:00 PM  Result Value Ref Range   Glucose-Capillary 152 (H) 65 - 99 mg/dL  Glucose, capillary     Status: Abnormal   Collection Time: 09/16/16 12:42 PM  Result Value Ref Range   Glucose-Capillary 183 (H) 65 - 99 mg/dL   Comment 1 Notify RN   Glucose, capillary     Status: Abnormal   Collection Time: 09/16/16  5:23 PM  Result Value Ref Range   Glucose-Capillary 146 (H) 65 - 99 mg/dL  Glucose, capillary     Status: Abnormal   Collection Time: 09/16/16 10:31 PM  Result Value Ref Range   Glucose-Capillary 233 (H) 65 - 99 mg/dL  CBC     Status: Abnormal   Collection Time: 09/17/16  5:28 AM  Result Value Ref Range   WBC 11.5 (H) 4.0 - 10.5 K/uL   RBC 4.53 4.22 - 5.81 MIL/uL   Hemoglobin 14.2 13.0 - 17.0 g/dL   HCT 40.9 39.0 - 52.0 %   MCV 90.3  78.0 - 100.0 fL   MCH 31.3 26.0 - 34.0 pg   MCHC 34.7 30.0 - 36.0 g/dL   RDW 12.8 11.5 - 15.5 %   Platelets 97 (L) 150 - 400 K/uL    Comment: CONSISTENT WITH PREVIOUS RESULT  Ammonia     Status: None   Collection Time: 09/17/16  7:26 AM  Result Value Ref Range   Ammonia 30 9 - 35 umol/L  Glucose, capillary     Status: Abnormal   Collection Time: 09/17/16  8:33 AM  Result Value Ref Range   Glucose-Capillary 237 (H) 65 - 99 mg/dL  Glucose, capillary     Status: Abnormal   Collection Time: 09/17/16 12:09 PM  Result Value Ref Range   Glucose-Capillary 181 (H) 65 - 99 mg/dL    Current Facility-Administered Medications  Medication Dose Route Frequency Provider Last Rate Last Dose  . acetaminophen (TYLENOL) tablet 650 mg  650 mg Oral Q6H PRN Rise Patience, MD   650 mg at 09/15/16 2252  . cefTRIAXone (ROCEPHIN) 1 g in dextrose 5 % 50 mL IVPB  1 g Intravenous Q24H Rosita Fire, MD   Stopped at 09/17/16 1148  . divalproex (DEPAKOTE) DR tablet 1,500 mg  1,500 mg Oral QHS Ivor Costa, MD   1,500 mg at 09/16/16 2148  . enoxaparin (LOVENOX) injection 40 mg  40 mg Subcutaneous Q24H Rosita Fire, MD   40 mg at 09/17/16 0943  . folic acid (FOLVITE) tablet 1 mg  1 mg Oral Daily Amin, Ankit Chirag, MD   1 mg at 09/17/16 0944  . haloperidol lactate (HALDOL) injection 5 mg  5 mg Intramuscular Q6H PRN Rosita Fire, MD      . hydrALAZINE (APRESOLINE) injection 5 mg  5 mg Intravenous Q2H PRN Ivor Costa, MD      . insulin aspart (novoLOG) injection 0-9 Units  0-9 Units Subcutaneous TID WC Schorr, Rhetta Mura, NP   2 Units at 09/17/16 1259  . LORazepam (ATIVAN) injection 1-2 mg  1-2 mg Intravenous Q4H PRN Cherene Altes, MD   2 mg at 09/09/16 2136  . LORazepam (ATIVAN) tablet 0.5 mg  0.5 mg Oral BID Rosita Fire, MD   0.5 mg at 09/17/16 0944  . ondansetron (ZOFRAN) tablet 4 mg  4 mg Oral Q6H PRN Ivor Costa, MD       Or  . ondansetron (ZOFRAN) injection 4 mg  4 mg  Intravenous Q6H PRN Ivor Costa, MD      . oxyCODONE-acetaminophen (PERCOCET/ROXICET) 5-325 MG per tablet 1-2 tablet  1-2 tablet Oral Q4H PRN Ivor Costa, MD      . propranolol (INDERAL) tablet 20 mg  20 mg Oral BID Ivor Costa, MD   20 mg at 09/17/16 0944  . QUEtiapine (SEROQUEL) tablet 200 mg  200 mg Oral QHS Amin, Ankit Chirag, MD   200 mg at 09/16/16 2139  . sodium chloride flush (NS) 0.9 % injection 3 mL  3 mL Intravenous Q12H Ivor Costa, MD   3 mL at 09/17/16 0944  . tamsulosin (FLOMAX) capsule 0.4 mg  0.4 mg Oral Daily Rosita Fire, MD   0.4 mg at 09/17/16 0944  . testosterone (ANDROGEL) 50 MG/5GM (1%) gel 5 g  5 g Transdermal Daily Rosita Fire, MD   5 g at 09/17/16 0943  . traZODone (DESYREL) tablet 100 mg  100 mg Oral QHS Amin, Ankit Chirag, MD   100 mg at 09/16/16 2148    Musculoskeletal: Strength & Muscle Tone: within normal limits Gait & Station: unable to stand Patient leans: N/A  Psychiatric Specialty Exam: Physical Exam  Review of Systems  Psychiatric/Behavioral: Positive for depression and substance abuse. Negative for hallucinations and suicidal ideas. The patient is nervous/anxious and has insomnia.   All other systems reviewed and are negative.   Blood pressure 128/67, pulse 72, temperature 99.4 F (37.4 C), temperature source Oral, resp. rate 18, height 6' 4"  (1.93 m), weight 97.1 kg (214 lb 1.1 oz), SpO2 97 %.Body mass index is 26.06 kg/m.  General Appearance: Disheveled  Eye Contact:  Minimal  Speech:  Clear and Coherent and Slow  Volume:  Decreased  Mood:  Dysphoric  Affect:  Congruent and Restricted  Thought Process:  Disorganized and Descriptions of Associations: Tangential  Orientation:  Other:  self  Thought Content:  Illogical and Tangential  Suicidal Thoughts:  No  Homicidal Thoughts:  No  Memory:  Immediate;   Poor Recent;   Poor Remote;   Poor  Judgement:  Impaired  Insight:  Lacking  Psychomotor Activity:  Mannerisms   Concentration:  Concentration: Poor and Attention Span: Poor  Recall:  Poor  Fund of Knowledge:  NA  Language:  Poor  Akathisia:  No  Handed:  Right  AIMS (if indicated):     Assets:  Housing Social Support  ADL's:  Impaired  Cognition:  Impaired,  Moderate  Sleep:      Treatment Plan Summary: Acute metabolic encephalopathy with probable delirium, unstable, with recommendation to continue meds as reviewed below on 09/17/16 and cessation of the PRN high-dose ativan was we suggested 2 days ago as this will worsen his delirium severely and increase his fall risk.   Plan Psychiatry consult service to follow patient Continue Seroquel to 200 mg QHS Continue Trazodone to 100 mg QHS Continue Ativan 0.5 mg twice a day Discontinue PRN Ativan as it can worsen patient's confusion. Continue Haldol 5 mg every 6 when necessary agitation Continue Depakote 1500 mg QHS, monitor for the valproic acid level Patient would benefit from an ammonia level being done  Disposition: Will continue to follow daily and work with medical team to rule in/out delirium vs. Other etiologies as confusion improves.   Benjamine Mola, FNP 09/17/2016 2:08 PM

## 2016-09-18 LAB — URINE CULTURE: Culture: >=100000 — AB

## 2016-09-18 LAB — GLUCOSE, CAPILLARY
GLUCOSE-CAPILLARY: 236 mg/dL — AB (ref 65–99)
GLUCOSE-CAPILLARY: 174 mg/dL — AB (ref 65–99)
Glucose-Capillary: 143 mg/dL — ABNORMAL HIGH (ref 65–99)
Glucose-Capillary: 161 mg/dL — ABNORMAL HIGH (ref 65–99)

## 2016-09-18 MED ORDER — CEPHALEXIN 500 MG PO CAPS
500.0000 mg | ORAL_CAPSULE | Freq: Two times a day (BID) | ORAL | Status: AC
  Administered 2016-09-18 – 2016-09-24 (×14): 500 mg via ORAL
  Filled 2016-09-18 (×14): qty 1

## 2016-09-18 NOTE — Progress Notes (Signed)
Occupational Therapy Treatment Patient Details Name: [name] MRN: [id number] DOB: 1966-03-18 Today's Date: 09/18/2016    History of present illness 51 y.o.malewith history significant of PTSD, bipolar disorder, ADD, depression, anxiety, GERD, CKD stage III, seizure d/o, HTN, HLD, and DM who presentedwith altered mental status. The patient's sister reported he had been cognitively declining for the past year w/ multiple falls at home. Non - compliant w/ his medications per family, more confused and restless, reporting auditory and visual hallucinations. CT head in the ED was w/o acute findings.    OT comments  Pt continues to progress, but remains a high fall risk and demonstrates poor awareness of safety and deficits. Pt demonstrated ability to perform 1 grooming task seated at sink and dress with set up to moderate assistance. Attention remains poor, requiring multiple cues to continue the activity. Pt needs to be independent for d/c to [facility], recommending SNF.  Follow Up Recommendations  SNF;Supervision/Assistance - 24 hour    Equipment Recommendations       Recommendations for Other Services      Precautions / Restrictions Precautions Precautions: Fall       Mobility Bed Mobility Overal bed mobility: Modified Independent             General bed mobility comments: no assist, HOB flat  Transfers Overall transfer level: Needs assistance Equipment used: Rolling walker (2 wheeled) Transfers: Sit to/from Stand Sit to Stand: Min assist         General transfer comment: steadying assist from chair and bed    Balance Overall balance assessment: History of Falls;Needs assistance Sitting-balance support: No upper extremity supported;Feet unsupported Sitting balance-Leahy Scale: Poor Sitting balance - Comments: LOB posterior with attempt to manage socks, requires sitting in chair with a back     Standing balance-Leahy Scale: Poor Standing balance  comment: Reliant on at least on hand for support in standing.                            ADL either performed or assessed with clinical judgement   ADL Overall ADL's : Needs assistance/impaired     Grooming: Oral care;Sitting;Set up Grooming Details (indicate cue type and reason): pt does not use toothbrushes, states he has calcium problems, used toothette and mouthwash         Upper Body Dressing : Set up;Sitting   Lower Body Dressing: Minimal assistance;Sitting/lateral leans Lower Body Dressing Details (indicate cue type and reason): socks, pants simulated with pillowcase, in supported sitting Toilet Transfer: Ambulation;RW;Moderate assistance           Functional mobility during ADLs: Rolling walker;Cueing for safety;Moderate assistance (pt very unsafe with walker) General ADL Comments: Pt immediately agreeable to working with OT. Sitter to pass information on to next shift to have pt complete self care with as little help as possible.     Vision       Perception     Praxis      Cognition Arousal/Alertness: Awake/alert Behavior During Therapy: Impulsive Overall Cognitive Status: Impaired/Different from baseline Area of Impairment: Attention;Following commands;Problem solving;Orientation                   Current Attention Level: Sustained   Following Commands: Follows one step commands with increased time Safety/Judgement: Decreased awareness of safety;Decreased awareness of deficits   Problem Solving: Slow processing;Requires verbal cues General Comments: improved ability to complete a thought, but continues to be easily  distracted        Exercises     Shoulder Instructions       General Comments      Pertinent Vitals/ Pain       Pain Assessment: No/denies pain  Home Living                                          Prior Functioning/Environment              Frequency  Min 2X/week        Progress  Toward Goals  OT Goals(current goals can now be found in the care plan section)  Progress towards OT goals: Progressing toward goals  Acute Rehab OT Goals Patient Stated Goal: to go to behavioral health OT Goal Formulation: With patient Time For Goal Achievement: 09/30/16 Potential to Achieve Goals: [place] Discharge plan remains appropriate    Co-evaluation                 AM-PAC PT "6 Clicks" Daily Activity     Outcome Measure   Help from another person eating meals?: None Help from another person taking care of personal grooming?: A Little Help from another person toileting, which includes using toliet, bedpan, or urinal?: A Lot Help from another person bathing (including washing, rinsing, drying)?: A Lot Help from another person to put on and taking off regular upper body clothing?: None Help from another person to put on and taking off regular lower body clothing?: A Little 6 Click Score: 18    End of Session Equipment Utilized During Treatment: Gait belt;Rolling walker  OT Visit Diagnosis: Unsteadiness on feet (R26.81);Other abnormalities of gait and mobility (R26.89);Repeated falls (R29.6);Muscle weakness (generalized) (M62.81);History of falling (Z91.81);Other symptoms and signs involving cognitive function   Activity Tolerance Patient tolerated treatment well   Patient Left in bed;with call bell/phone within reach;with nursing/sitter in room   Nurse Communication          Time: 7948-0165 OT Time Calculation (min): 31 min  Charges: OT General Charges $OT Visit: 1 Procedure OT Treatments $Self Care/Home Management : 23-37 mins   Malka So 09/18/2016, 3:18 PM  [phone]

## 2016-09-18 NOTE — [place] FL2 (Signed)
[place] MEDICAID FL2 LEVEL OF CARE SCREENING TOOL     IDENTIFICATION  Patient Name: [name] Birthdate: 21-Sep-1965 Sex: male Admission Date (Current Location): 09/07/2016  [facility] and [place] Number:  Herbalist and Address:  [facility] [place], [place] [address], [place], [place] [address]      Provider Number: [id number]  Attending Physician Name and Address:  Rosita Fire, MD  Relative Name and Phone Number:  [name] sister, [phone]    Current Level of Care: Hospital Recommended Level of Care: [place] Prior Approval Number:    Date Approved/Denied:   PASRR Number:    Discharge Plan: SNF    Current Diagnoses: Patient Active Problem List   Diagnosis Date Noted  . Acute lower UTI   . Acute urinary retention   . Acute metabolic encephalopathy 95/63/8756  . HLD (hyperlipidemia) 09/07/2016  . GERD (gastroesophageal reflux disease) 09/07/2016  . PTSD (post-traumatic stress disorder) 09/07/2016  . Acute encephalopathy 09/07/2016  . Fall 09/07/2016  . CRI (chronic renal insufficiency), stage 3 (moderate)   . Altered mental status   . Multiple drug overdose 12/27/2015  . Suicidal overdose ([place]) 12/26/2015  . Suicide attempt by multiple drug overdose ([place]) 12/26/2015  . Ataxia 10/14/2015  . Slurring of speech 10/14/2015  . Leg weakness 10/03/2015  . CRF (chronic renal failure) 10/03/2015  . Hypokalemia 10/03/2015  . Hemiparesis ([place])   . Seizures ([place])   . Primary osteoarthritis of left hip 05/22/2015  . Exposure to potentially hazardous body fluids 02/12/2015  . Loss of weight 09/04/2014  . Dyslipidemia 05/31/2014  . Left hip pain 02/13/2014  . Insomnia 11/11/2013  . Degenerative joint disease (DJD) of hip 08/16/2013  . Acute respiratory failure ([place]) 11/02/2012  . Pneumonia, organism unspecified(486) 11/02/2012  . Pleural effusion 11/02/2012  . Hypoxemia 11/02/2012  . HCAP (healthcare-associated  pneumonia) 10/31/2012  . Anemia 10/31/2012  . Osteoarthritis of right hip 11/28/2011  . Right hip pain 07/22/2011  . Bilateral hip pain 07/22/2011  . Low back pain 05/20/2011  . Testicular pain, right 05/20/2011  . Erectile dysfunction 02/17/2011  . Constipation - functional 02/17/2011  . Diabetes type 2, uncontrolled ([place]) 11/06/2010  . Hypogonadism male 05/20/2010  . TOBACCO USER 05/20/2010  . Demoralization and apathy 05/20/2010  . Bipolar disorder ([place]) 01/18/2010  . Depression 01/18/2010  . MICROSCOPIC HEMATURIA 01/18/2010  . SHOULDER PAIN 01/18/2010  . Anxiety state 12/05/2006  . Essential hypertension 12/05/2006    Orientation RESPIRATION BLADDER Height & Weight     Self, Place  Normal Continent Weight: 97.1 kg (214 lb 1.1 oz) Height:  6\' 4"  (193 cm)  BEHAVIORAL SYMPTOMS/MOOD NEUROLOGICAL BOWEL NUTRITION STATUS      Continent Diet (Please see DC Summary)  AMBULATORY STATUS COMMUNICATION OF NEEDS Skin   Extensive Assist Verbally Normal                       Personal Care Assistance Level of Assistance  Bathing, Feeding, Dressing Bathing Assistance: Maximum assistance Feeding assistance: Limited assistance Dressing Assistance: Limited assistance     Functional Limitations Info             SPECIAL CARE FACTORS FREQUENCY  PT (By licensed PT), OT (By licensed OT)     PT Frequency: 5x/week OT Frequency: 3x/week            Contractures      Additional Factors Info  Code Status, Allergies, Psychotropic, Insulin  Sliding Scale Code Status Info: Full Allergies Info: Vicodin Hydrocodone-acetaminophen Psychotropic Info: Depakote; Seroquel; Trazadone; Ativan Insulin Sliding Scale Info: 3x daily with meals       Current Medications (09/18/2016):  This is the current hospital active medication list Current Facility-Administered Medications  Medication Dose Route Frequency Provider Last Rate Last Dose  . acetaminophen (TYLENOL) tablet 650 mg  650 mg  Oral Q6H PRN Rise Patience, MD   650 mg at 09/15/16 2252  . cephALEXin (KEFLEX) capsule 500 mg  500 mg Oral Q12H Rosita Fire, MD   500 mg at 09/18/16 1001  . divalproex (DEPAKOTE) DR tablet 1,500 mg  1,500 mg Oral QHS Ivor Costa, MD   1,500 mg at 09/17/16 2249  . enoxaparin (LOVENOX) injection 40 mg  40 mg Subcutaneous Q24H Rosita Fire, MD   40 mg at 09/18/16 1000  . folic acid (FOLVITE) tablet 1 mg  1 mg Oral Daily Amin, Ankit Chirag, MD   1 mg at 09/18/16 1001  . haloperidol lactate (HALDOL) injection 5 mg  5 mg Intramuscular Q6H PRN Rosita Fire, MD      . hydrALAZINE (APRESOLINE) injection 5 mg  5 mg Intravenous Q2H PRN Ivor Costa, MD      . insulin aspart (novoLOG) injection 0-9 Units  0-9 Units Subcutaneous TID WC Schorr, Rhetta Mura, NP   1 Units at 09/18/16 1001  . LORazepam (ATIVAN) tablet 0.5 mg  0.5 mg Oral BID Rosita Fire, MD   0.5 mg at 09/18/16 1001  . ondansetron (ZOFRAN) tablet 4 mg  4 mg Oral Q6H PRN Ivor Costa, MD       Or  . ondansetron [facility]) injection 4 mg  4 mg Intravenous Q6H PRN Ivor Costa, MD      . oxyCODONE-acetaminophen (PERCOCET/ROXICET) 5-325 MG per tablet 1-2 tablet  1-2 tablet Oral Q4H PRN Ivor Costa, MD      . propranolol (INDERAL) tablet 20 mg  20 mg Oral BID Ivor Costa, MD   20 mg at 09/18/16 1000  . QUEtiapine (SEROQUEL) tablet 200 mg  200 mg Oral QHS Amin, Ankit Chirag, MD   200 mg at 09/17/16 2250  . sodium chloride flush (NS) 0.9 % injection 3 mL  3 mL Intravenous Q12H Ivor Costa, MD   3 mL at 09/18/16 1002  . tamsulosin (FLOMAX) capsule 0.4 mg  0.4 mg Oral Daily Rosita Fire, MD   0.4 mg at 09/18/16 1001  . testosterone (ANDROGEL) 50 MG/5GM (1%) gel 5 g  5 g Transdermal Daily Rosita Fire, MD   5 g at 09/18/16 1000  . traZODone (DESYREL) tablet 100 mg  100 mg Oral QHS Amin, Ankit Chirag, MD   100 mg at 09/17/16 2250     Discharge Medications: Please see discharge summary for a list of  discharge medications.  Relevant Imaging Results:  Relevant Lab Results:   Additional Information SSN [id number]  Benard Halsted, LCSWA

## 2016-09-18 NOTE — Progress Notes (Signed)
Physical Therapy Treatment Patient Details Name: [name] MRN: [id number] DOB: [birth year] Today's Date: 09/18/2016    History of Present Illness 51 y.o.malewith history significant of PTSD, bipolar disorder, ADD, depression, anxiety, GERD, CKD stage III, seizure d/o, HTN, HLD, and DM who presentedwith altered mental status. The patient's sister reported he had been cognitively declining for the past year w/ multiple falls at home. Non - compliant w/ his medications per family, more confused and restless, reporting auditory and visual hallucinations. CT head in the ED was w/o acute findings.     PT Comments    Patient progressing with distance with ambulation with encouragement.  Seems self limited as well as globally weak.  Continues to be appropriate for skilled PT in the acute setting.  Will need SNF rehab at d/c.  Follow Up Recommendations  SNF;Supervision/Assistance - 24 hour     Equipment Recommendations  Other (comment) (TBA)    Recommendations for Other Services       Precautions / Restrictions Precautions Precautions: Fall    Mobility  Bed Mobility Overal bed mobility: Modified Independent       Supine to sit: HOB elevated     General bed mobility comments: no assist, HOB flat  Transfers Overall transfer level: Needs assistance Equipment used: None Transfers: Sit to/from Stand Sit to Stand: Min assist         General transfer comment: assist for balance  Ambulation/Gait Ambulation/Gait assistance: Min assist Ambulation Distance (Feet): 140 Feet (&12') Assistive device: 1 person hand held assist Gait Pattern/deviations: Step-to pattern;Decreased stride length;Shuffle;Decreased dorsiflexion - right;Drifts right/left;Trunk flexed     General Gait Details: limited by reports of fatigue, flexed posture with head and neck flexion and excuses why he cannot elevate head when cued.  Patient fatigued with activity   Stairs            Wheelchair  Mobility    Modified Rankin (Stroke Patients Only)       Balance Overall balance assessment: Needs assistance Sitting-balance support: No upper extremity supported;Feet unsupported Sitting balance-Leahy Scale: Fair Sitting balance - Comments: toileted on his own in bathroom from sitting   Standing balance support: No upper extremity supported;During functional activity Standing balance-Leahy Scale: Poor Standing balance comment: standing to perform hygiene in bathroom after toileting and attempts to lift pants, but leaning back, flexed posture and knees                            Cognition Arousal/Alertness: Awake/alert Behavior During Therapy: Impulsive Overall Cognitive Status: Impaired/Different from baseline Area of Impairment: Attention;Following commands;Problem solving;Safety/judgement                   Current Attention Level: Sustained   Following Commands: Follows one step commands with increased time Safety/Judgement: Decreased awareness of safety;Decreased awareness of deficits   Problem Solving: Slow processing;Requires verbal cues General Comments: improved ability to complete a thought, but continues to be easily distracted      Exercises      General Comments General comments (skin integrity, edema, etc.): sitter present; side stepping in hallway, but limited by pt fatigue      Pertinent Vitals/Pain Pain Assessment: No/denies pain Faces Pain Scale: Hurts even more Pain Location: back Pain Descriptors / Indicators: Discomfort Pain Intervention(s): Monitored during session;Repositioned    Home Living  Prior Function            PT Goals (current goals can now be found in the care plan section) Acute Rehab PT Goals Patient Stated Goal: to go to behavioral health Progress towards PT goals: Progressing toward goals    Frequency    Min 2X/week      PT Plan Current plan remains appropriate     Co-evaluation              AM-PAC PT "6 Clicks" Daily Activity  Outcome Measure  Difficulty turning over in bed (including adjusting bedclothes, sheets and blankets)?: None Difficulty moving from lying on back to sitting on the side of the bed? : None Difficulty sitting down on and standing up from a chair with arms (e.g., wheelchair, bedside commode, etc,.)?: Total Help needed moving to and from a bed to chair (including a wheelchair)?: A Little Help needed walking in hospital room?: A Little Help needed climbing 3-5 steps with a railing? : A Lot 6 Click Score: 17    End of Session Equipment Utilized During Treatment: Gait belt Activity Tolerance: Patient limited by fatigue Patient left: in bed;with call bell/phone within reach;with nursing/sitter in room   PT Visit Diagnosis: Repeated falls (R29.6);Difficulty in walking, not elsewhere classified (R26.2)     Time: 7741-4239 PT Time Calculation (min) (ACUTE ONLY): 21 min  Charges:  $Gait Training: 8-22 mins                    G CodesMagda Kiel, [place] [phone] 09/18/2016    Reginia Naas 09/18/2016, 4:22 PM

## 2016-09-18 NOTE — Progress Notes (Signed)
CSW assisting with discharge planning. Patient is not able to be independent with his ADLs and therefore is not a candidate for psych placement. CSW completing an LOG SNF search.  Percell Locus Conlin Brahm LCSWA [phone]

## 2016-09-18 NOTE — Progress Notes (Signed)
PROGRESS NOTE    [name]  FGH:[id number] DOB: 1966/01/27 DOA: 09/07/2016 PCP: Beverly Sessions   Brief Narrative: 51 y.o.malewith history significant of PTSD, bipolar disorder, ADD, depression, anxiety, GERD, CKD stage III, seizure d/o, HTN, HLD, and DM who presentedwith altered mental status. The patient's sister reported he had been cognitively declining for the past year w/ multiple falls at home. He has not been compliant w/ his medications. He had recentlybecome more confused and restless, reporting auditory and visual hallucinations. CT head in the ED was w/o acute findings.   Assessment & Plan:   # Altered mental status likely acute metabolic encephalopathy versus medication related versus chronic cognitive decline: CT scan of head showed mildly advanced cerebral atrophy with minimal chronic ischemic changes. No acute finding. -TSH, vitamin C78, folic acid level acceptable. HIV and RPR nonreactive. -Evaluation ongoing by psychiatrist and adjusted medications. They recommended neurology consult. Evaluated by neurologist and obtained MRI of the brain and spine with no acute finding. Neurologist discontinued Truvada as there is no clear indication for this medication. Neurologist recommended outpatient follow-up for further evaluation including EMG/neuropsych testing. -Seems like patient's mental status is improving and is stable. He was pleasant and following commands today. He does have cognitive and memory issues especially with advanced cerebral atrophy. I discussed with the patient and his sister over the phone at length. Also discussed with the social worker regarding discharge planning. He will either go to inpatient behavioral health versus skilled nursing facility. Safe discharge planning on going at this time.  # Fever, leukocytosis and urine consistent with urinary tract infection likely due to recent Foley catheter insertion:  -Urine culture growing Escherichia coli which is  pansensitive. I'll discontinue IV ceftriaxone and he started oral Keflex. Blood cultures are pending.  # Acute kidney injury on CKD stage III -Serum creatinine level is stable. Continue to monitor. Avoid neurotoxin.  # Acute urinary retention: Discontinued indwelling Foley catheter. Continue Flomax. Continue to monitor.  # PTSD/anxiety/attention deficit disorder/bipolar disorde, Suicidal ideation -Medications adjusted by psychiatrist. Continue suicide precautions.  Psychiatrist recommended inpatient psych admission. -Continue Ativan 0.5 twice a day. -Waiting for inpatient psychiatric floor bed. Social worker eval is ongoing. -Psych is following  #History of seizure disorder -Continue Depakote. Ativan as needed. No seizure-like activity. Neurology consult appreciated.  # Hypertension -Off Norvasc. Continue propanolol if tolerated. Monitor blood pressure and heart rate.  # Hypokalemia: Improved. Magnesium level acceptable.  #Chronic thrombocytopenia: No sign of bleeding. Monitor platelet counts.   Principal Problem:   Acute metabolic encephalopathy Active Problems:   Bipolar disorder ([place])   Anxiety state   Depression   Essential hypertension   Diabetes type 2, uncontrolled ([facility])   Hypokalemia   Seizures ([facility])   HLD (hyperlipidemia)   PTSD (post-traumatic stress disorder)   Acute encephalopathy   CRI (chronic renal insufficiency), stage 3 (moderate)   Fall   Acute urinary retention   Acute lower UTI  DVT prophylaxis: Lovenox subcutaneous Code Status: Full code Family Communication: Discussed with the patient's sister over the phone at length. Disposition Plan: Likely discharge to inpatient psych vs SNF in 1-2 days when bed is available. Discussed with the Education officer, museum.    Consultants:   Psychiatrist  Neurologist  Procedures: None Antimicrobials: Keflex   Subjective: Seen and examined at bedside. Denied headache, dizziness, nausea, vomiting. Denied  suicidal ideation. Objective: Vitals:   09/17/16 0651 09/17/16 1319 09/17/16 2135 09/18/16 0618  BP: (!) 102/53 128/67 (!) 114/57 108/63  Pulse: 73 72 71  61  Resp: 20 18 16 16   Temp: 98.4 F (36.9 C) 99.4 F (37.4 C) 99 F (37.2 C) 97.4 F (36.3 C)  TempSrc: Oral Oral  Oral  SpO2: 95% 97% 98% 95%  Weight:      Height:        Intake/Output Summary (Last 24 hours) at 09/18/16 1225 Last data filed at 09/18/16 1000  Gross per 24 hour  Intake              480 ml  Output              250 ml  Net              230 ml   Filed Weights   09/10/16 1029 09/15/16 0517  Weight: 94.4 kg (208 lb 1.8 oz) 97.1 kg (214 lb 1.1 oz)    Examination:  General exam:Not in distress  Respiratory system: Clear bilateral, respiratory effort normal. Cardiovascular system: Regular rate rhythm, S1 is normal. No pedal edema Gastrointestinal system: Abdomen is nondistended, soft and nontender. Normal bowel sounds heard. Central nervous system: Following commands, alert, awake. Nonfocal. Extremities: Symmetric 5 x 5 power. Skin: No rashes, lesions or ulcers Psychiatry: Depressed mood  Data Reviewed: I have personally reviewed following labs and imaging studies  CBC:  Recent Labs Lab 09/15/16 2240 09/16/16 0800 09/17/16 0528  WBC 13.0* 13.4* 11.5*  NEUTROABS 9.1*  --   --   HGB 14.8 14.0 14.2  HCT 43.1 40.7 40.9  MCV 89.2 89.1 90.3  PLT 109* 92* 97*   Basic Metabolic Panel:  Recent Labs Lab 09/16/16 0800  NA 134*  K 4.5  CL 106  CO2 19*  GLUCOSE 212*  BUN 30*  CREATININE 2.01*  CALCIUM 9.1   GFR: Estimated Creatinine Clearance: 55.2 mL/min (A) (by C-G formula based on SCr of 2.01 mg/dL (H)). Liver Function Tests: No results for input(s): AST, ALT, ALKPHOS, BILITOT, PROT, ALBUMIN in the last 168 hours. No results for input(s): LIPASE, AMYLASE in the last 168 hours.  Recent Labs Lab 09/17/16 0726  AMMONIA 30   Coagulation Profile: No results for input(s): INR, PROTIME in  the last 168 hours. Cardiac Enzymes: No results for input(s): CKTOTAL, CKMB, CKMBINDEX, TROPONINI in the last 168 hours. BNP (last 3 results) No results for input(s): PROBNP in the last 8760 hours. HbA1C: No results for input(s): HGBA1C in the last 72 hours. CBG:  Recent Labs Lab 09/17/16 1209 09/17/16 1634 09/17/16 2133 09/18/16 0748 09/18/16 1210  GLUCAP 181* 160* 166* 143* 236*   Lipid Profile: No results for input(s): CHOL, HDL, LDLCALC, TRIG, CHOLHDL, LDLDIRECT in the last 72 hours. Thyroid Function Tests: No results for input(s): TSH, T4TOTAL, FREET4, T3FREE, THYROIDAB in the last 72 hours. Anemia Panel: No results for input(s): VITAMINB12, FOLATE, FERRITIN, TIBC, IRON, RETICCTPCT in the last 72 hours. Sepsis Labs:  Recent Labs Lab 09/15/16 2240  LATICACIDVEN 1.2    Recent Results (from the past 240 hour(s))  Culture, blood (routine x 2)     Status: None (Preliminary result)   Collection Time: 09/15/16 10:45 PM  Result Value Ref Range Status   Specimen Description BLOOD RIGHT ARM  Final   Special Requests IN PEDIATRIC BOTTLE Blood Culture adequate volume  Final   Culture NO GROWTH 1 DAY  Final   Report Status PENDING  Incomplete  Culture, blood (routine x 2)     Status: None (Preliminary result)   Collection Time: 09/15/16 10:59 PM  Result Value  Ref Range Status   Specimen Description BLOOD LEFT ARM  Final   Special Requests   Final    BOTTLES DRAWN AEROBIC AND ANAEROBIC Blood Culture adequate volume   Culture NO GROWTH 1 DAY  Final   Report Status PENDING  Incomplete  Culture, Urine     Status: Abnormal   Collection Time: 09/15/16 11:22 PM  Result Value Ref Range Status   Specimen Description URINE, CLEAN CATCH  Final   Special Requests NONE  Final   Culture >=100,000 COLONIES/mL ESCHERICHIA COLI (A)  Final   Report Status 09/18/2016 FINAL  Final   Organism ID, Bacteria ESCHERICHIA COLI (A)  Final      Susceptibility   Escherichia coli - MIC*     AMPICILLIN <=2 SENSITIVE Sensitive     CEFAZOLIN <=4 SENSITIVE Sensitive     CEFTRIAXONE <=1 SENSITIVE Sensitive     CIPROFLOXACIN <=0.25 SENSITIVE Sensitive     GENTAMICIN <=1 SENSITIVE Sensitive     IMIPENEM <=0.25 SENSITIVE Sensitive     NITROFURANTOIN <=16 SENSITIVE Sensitive     TRIMETH/SULFA <=20 SENSITIVE Sensitive     AMPICILLIN/SULBACTAM <=2 SENSITIVE Sensitive     PIP/TAZO <=4 SENSITIVE Sensitive     Extended ESBL NEGATIVE Sensitive     * >=100,000 COLONIES/mL ESCHERICHIA COLI         Radiology Studies: No results found.      Scheduled Meds: . cephALEXin  500 mg Oral Q12H  . divalproex  1,500 mg Oral QHS  . enoxaparin (LOVENOX) injection  40 mg Subcutaneous Q24H  . folic acid  1 mg Oral Daily  . insulin aspart  0-9 Units Subcutaneous TID WC  . LORazepam  0.5 mg Oral BID  . propranolol  20 mg Oral BID  . QUEtiapine  200 mg Oral QHS  . sodium chloride flush  3 mL Intravenous Q12H  . tamsulosin  0.4 mg Oral Daily  . testosterone  5 g Transdermal Daily  . traZODone  100 mg Oral QHS   Continuous Infusions:    LOS: 11 days    Geraline Halberstadt Tanna Furry, MD [facility] Pager [phone]  If 7PM-7AM, please contact night-coverage [email or web address] Password [facility] 09/18/2016, 12:25 PM

## 2016-09-19 DIAGNOSIS — W19XXXD Unspecified fall, subsequent encounter: Secondary | ICD-10-CM

## 2016-09-19 LAB — GLUCOSE, CAPILLARY
GLUCOSE-CAPILLARY: 134 mg/dL — AB (ref 65–99)
GLUCOSE-CAPILLARY: 104 mg/dL — AB (ref 65–99)
GLUCOSE-CAPILLARY: 145 mg/dL — AB (ref 65–99)

## 2016-09-19 MED ORDER — INSULIN ASPART 100 UNIT/ML ~~LOC~~ SOLN
4.0000 [IU] | Freq: Three times a day (TID) | SUBCUTANEOUS | Status: DC
  Administered 2016-09-19 – 2016-09-22 (×10): 4 [IU] via SUBCUTANEOUS

## 2016-09-19 NOTE — Progress Notes (Signed)
Pt removed his IV. Will wait a little bit more for another IV to be inserted in.

## 2016-09-19 NOTE — Progress Notes (Signed)
Progress Note    [name]  CVE:[id number] DOB: [birth year]  DOA: 09/07/2016 PCP: Beverly Sessions    Brief Narrative:   Chief complaint: Follow-up AMS  Medical records reviewed and are as summarized below:  [name] is an 51 y.o. male with a PMH of PTSD, bipolar disorder, ADD, depression, anxiety, stage III CKD, history of seizures, and diabetes who was admitted 09/07/16 for evaluation of AMS/fall. He was noted to be noncompliant with his medications and to have been declining over the past year per his sister. Initial laboratory workup unremarkable other than elevated ammonia level and CT scan of the head showed no evidence of acute intracranial abnormalities. He was noted to be restless and possibly psychotic by nursing staff on admission.  Assessment/Plan:   Principal Problem:   Acute metabolic encephalopathy Initial workup revealed an elevated ammonia level, but folate, B-12, TSH levels were all WNL. CT of the head was unremarkable for acute findings. MRI of the brain also negative for acute findings.  Active Problems:   Psychiatry illnesses including Bipolar disorder ([place]), depression, anxiety and PTSD Continue scheduled Ativan, Seroquel. Continue Haldol as needed. Evaluated by psychiatry and neurology.    Essential hypertension Continue propranolol. Norvasc discontinued due to borderline low blood pressure at times.    Diabetes type 2, uncontrolled ([place]) Evaluated by diabetes coordinator 09/16/16. Recommended continuing insulin sensitive SSI and adding 4 units of meal coverage. CBGs 143-246. Will add meal coverage as recommended.    Hypokalemia Repleted.    Seizure disorder Valproic acid level was therapeutic on admission. No documentation of recurrent seizures.    CRI (chronic renal insufficiency), stage 3 (moderate) Baseline creatinine appears to be 2.2-2.7. Creatinine stable at 2.0.    Fall Appears to be somewhat chronic with frequent falls noted. Evaluated by  neurology with subsequent recommendations to proceed with MRI of brain and spine. Findings unremarkable. Truvada discontinued in case it was contributing to his neuropathy. Neurologist ultimately recommended outpatient follow-up for EMG/neuropsychiatric testing as well as SPEP testing. PT evaluated the patient 09/12/16 and recommended SNF placement/24 hour supervision.    Acute urinary retention Noted to have 1 L of retention on 09/09/16. I&O cathed PRN. Flomax started 09/12/16 with resolution of urinary retention.    Acute lower UTI Developed fever 09/16/16, thought to be from UTI given recent need for Foley catheterization for treatment of urinary retention. Empirically placed on ceftriaxone. Urine cultures positive for Escherichia coli, Rocephin sensitive.    HIV screening The patient falls between the ages of 13-64 and should be screened for HIV, therefore HIV testing ordered: Nonreactive.    Chronic thrombocytopenia Platelet count low noted to be low dating back to 2017.   Family Communication/Anticipated D/C date and plan/Code Status   DVT prophylaxis: Lovenox ordered. Code Status: Full Code.  Family Communication: No family present at the bedside. Disposition Plan: Had been living with his sister. Plan is for SNF placement or [facility] if his mobility improves.   Medical Consultants:    Psychiatry  Neurology   Procedures:    None  Anti-Infectives:    Rocephin 09/16/16---> 09/18/16   Subjective:   Patient is largely without complaints. He appears to be confused and does not understand his plan of care. He tells me he lives with his sister. No reports of shortness of breath, says his appetite is fair.  Objective:    Vitals:   09/18/16 2132 09/18/16 2229 09/19/16 0124 09/19/16 0512  BP: 103/75 101/61 115/70 113/68  Pulse: 64 60 68 (!) 58  Resp: 18 18  16   Temp:  98.8 F (37.1 C)  97.5 F (36.4 C)  TempSrc:  Oral  Oral  SpO2: 98% 99%  98%  Weight:      Height:          Intake/Output Summary (Last 24 hours) at 09/19/16 0744 Last data filed at 09/18/16 2219  Gross per 24 hour  Intake              720 ml  Output              150 ml  Net              570 ml   Filed Weights   09/10/16 1029 09/15/16 0517  Weight: 94.4 kg (208 lb 1.8 oz) 97.1 kg (214 lb 1.1 oz)    Exam: General exam: Appears calm and comfortable. Respiratory system: Clear to auscultation. Respiratory effort normal. Cardiovascular system: S1 & S2 heard, RRR. No JVD,  rubs, gallops or clicks. No murmurs. Gastrointestinal system: Abdomen is nondistended, soft and nontender. No organomegaly or masses felt. Normal bowel sounds heard. Central nervous system: Alert and oriented to self and year, but not month. No focal neurological deficits. Extremities: No clubbing,  or cyanosis. No edema. Skin: No rashes, lesions or ulcers. Psychiatry: Judgement and insight appear impaired. Mood & affect depressed/flap.   Data Reviewed:   I have personally reviewed following labs and imaging studies:  Labs: Basic Metabolic Panel:  Recent Labs Lab 09/16/16 0800  NA 134*  K 4.5  CL 106  CO2 19*  GLUCOSE 212*  BUN 30*  CREATININE 2.01*  CALCIUM 9.1   GFR Estimated Creatinine Clearance: 55.2 mL/min (A) (by C-G formula based on SCr of 2.01 mg/dL (H)). Liver Function Tests: No results for input(s): AST, ALT, ALKPHOS, BILITOT, PROT, ALBUMIN in the last 168 hours. No results for input(s): LIPASE, AMYLASE in the last 168 hours.  Recent Labs Lab 09/17/16 0726  AMMONIA 30   Coagulation profile No results for input(s): INR, PROTIME in the last 168 hours.  CBC:  Recent Labs Lab 09/15/16 2240 09/16/16 0800 09/17/16 0528  WBC 13.0* 13.4* 11.5*  NEUTROABS 9.1*  --   --   HGB 14.8 14.0 14.2  HCT 43.1 40.7 40.9  MCV 89.2 89.1 90.3  PLT 109* 92* 97*   Cardiac Enzymes: No results for input(s): CKTOTAL, CKMB, CKMBINDEX, TROPONINI in the last 168 hours. BNP (last 3 results) No results  for input(s): PROBNP in the last 8760 hours. CBG:  Recent Labs Lab 09/17/16 2133 09/18/16 0748 09/18/16 1210 09/18/16 1642 09/18/16 2227  GLUCAP 166* 143* 236* 174* 161*   D-Dimer: No results for input(s): DDIMER in the last 72 hours. Hgb A1c: No results for input(s): HGBA1C in the last 72 hours. Lipid Profile: No results for input(s): CHOL, HDL, LDLCALC, TRIG, CHOLHDL, LDLDIRECT in the last 72 hours. Thyroid function studies: No results for input(s): TSH, T4TOTAL, T3FREE, THYROIDAB in the last 72 hours.  Invalid input(s): FREET3 Anemia work up: No results for input(s): VITAMINB12, FOLATE, FERRITIN, TIBC, IRON, RETICCTPCT in the last 72 hours. Sepsis Labs:  Recent Labs Lab 09/15/16 2240 09/16/16 0800 09/17/16 0528  WBC 13.0* 13.4* 11.5*  LATICACIDVEN 1.2  --   --     Microbiology Recent Results (from the past 240 hour(s))  Culture, blood (routine x 2)     Status: None (Preliminary result)   Collection Time: 09/15/16 10:45 PM  Result Value Ref Range Status   Specimen Description BLOOD RIGHT ARM  Final   Special Requests IN PEDIATRIC BOTTLE Blood Culture adequate volume  Final   Culture NO GROWTH 2 DAYS  Final   Report Status PENDING  Incomplete  Culture, blood (routine x 2)     Status: None (Preliminary result)   Collection Time: 09/15/16 10:59 PM  Result Value Ref Range Status   Specimen Description BLOOD LEFT ARM  Final   Special Requests   Final    BOTTLES DRAWN AEROBIC AND ANAEROBIC Blood Culture adequate volume   Culture NO GROWTH 2 DAYS  Final   Report Status PENDING  Incomplete  Culture, Urine     Status: Abnormal   Collection Time: 09/15/16 11:22 PM  Result Value Ref Range Status   Specimen Description URINE, CLEAN CATCH  Final   Special Requests NONE  Final   Culture >=100,000 COLONIES/mL ESCHERICHIA COLI (A)  Final   Report Status 09/18/2016 FINAL  Final   Organism ID, Bacteria ESCHERICHIA COLI (A)  Final      Susceptibility   Escherichia coli -  MIC*    AMPICILLIN <=2 SENSITIVE Sensitive     CEFAZOLIN <=4 SENSITIVE Sensitive     CEFTRIAXONE <=1 SENSITIVE Sensitive     CIPROFLOXACIN <=0.25 SENSITIVE Sensitive     GENTAMICIN <=1 SENSITIVE Sensitive     IMIPENEM <=0.25 SENSITIVE Sensitive     NITROFURANTOIN <=16 SENSITIVE Sensitive     TRIMETH/SULFA <=20 SENSITIVE Sensitive     AMPICILLIN/SULBACTAM <=2 SENSITIVE Sensitive     PIP/TAZO <=4 SENSITIVE Sensitive     Extended ESBL NEGATIVE Sensitive     * >=100,000 COLONIES/mL ESCHERICHIA COLI    Radiology: No results found.  Medications:   . cephALEXin  500 mg Oral Q12H  . divalproex  1,500 mg Oral QHS  . enoxaparin (LOVENOX) injection  40 mg Subcutaneous Q24H  . folic acid  1 mg Oral Daily  . insulin aspart  0-9 Units Subcutaneous TID WC  . LORazepam  0.5 mg Oral BID  . propranolol  20 mg Oral BID  . QUEtiapine  200 mg Oral QHS  . sodium chloride flush  3 mL Intravenous Q12H  . tamsulosin  0.4 mg Oral Daily  . testosterone  5 g Transdermal Daily  . traZODone  100 mg Oral QHS   Continuous Infusions:  Medical decision making is moderately complex therefore this is a level 2 visit. (> 3 problem points, 1 data point, moderate risk: Need 2 out of 3)    LOS: 12 days   Djuana Littleton  [facility] Pager [phone]. If unable to reach me by pager, please call my cell phone at [phone].  *Please refer to [email or web address], password [email or web address] to get updated schedule on who will round on this patient, as hospitalists switch teams weekly. If 7PM-7AM, please contact night-coverage at [email or web address], password [email or web address] for any overnight needs.  09/19/2016, 7:44 AM

## 2016-09-20 ENCOUNTER — Encounter (HOSPITAL_COMMUNITY): Payer: Self-pay | Admitting: Internal Medicine

## 2016-09-20 DIAGNOSIS — Z87898 Personal history of other specified conditions: Secondary | ICD-10-CM

## 2016-09-20 DIAGNOSIS — F0390 Unspecified dementia without behavioral disturbance: Secondary | ICD-10-CM

## 2016-09-20 LAB — GLUCOSE, CAPILLARY
GLUCOSE-CAPILLARY: 145 mg/dL — AB (ref 65–99)
GLUCOSE-CAPILLARY: 156 mg/dL — AB (ref 65–99)
GLUCOSE-CAPILLARY: 128 mg/dL — AB (ref 65–99)
GLUCOSE-CAPILLARY: 168 mg/dL — AB (ref 65–99)
Glucose-Capillary: 165 mg/dL — ABNORMAL HIGH (ref 65–99)

## 2016-09-20 LAB — COMPREHENSIVE METABOLIC PANEL
ALT: 19 U/L (ref 17–63)
AST: 23 U/L (ref 15–41)
Albumin: 3.2 g/dL — ABNORMAL LOW (ref 3.5–5.0)
Alkaline Phosphatase: 74 U/L (ref 38–126)
Anion gap: 6 (ref 5–15)
BUN: 34 mg/dL — ABNORMAL HIGH (ref 6–20)
CHLORIDE: 102 mmol/L (ref 101–111)
CO2: 27 mmol/L (ref 22–32)
Calcium: 8.9 mg/dL (ref 8.9–10.3)
Creatinine, Ser: 2.02 mg/dL — ABNORMAL HIGH (ref 0.61–1.24)
GFR, EST AFRICAN AMERICAN: 43 mL/min — AB (ref >60)
GFR, EST NON AFRICAN AMERICAN: 37 mL/min — AB (ref >60)
Glucose, Bld: 142 mg/dL — ABNORMAL HIGH (ref 65–99)
POTASSIUM: 3.5 mmol/L (ref 3.5–5.1)
SODIUM: 135 mmol/L (ref 135–145)
Total Bilirubin: 0.5 mg/dL (ref 0.3–1.2)
Total Protein: 6.6 g/dL (ref 6.5–8.1)

## 2016-09-20 LAB — CBC
HCT: 39.3 % (ref 39.0–52.0)
Hemoglobin: 13.8 g/dL (ref 13.0–17.0)
MCH: 30.7 pg (ref 26.0–34.0)
MCHC: 35.1 g/dL (ref 30.0–36.0)
MCV: 87.3 fL (ref 78.0–100.0)
PLATELETS: 123 10*3/uL — AB (ref 150–400)
RBC: 4.5 MIL/uL (ref 4.22–5.81)
RDW: 12.3 % (ref 11.5–15.5)
WBC: 3.9 10*3/uL — AB (ref 4.0–10.5)

## 2016-09-20 LAB — AMMONIA: Ammonia: 22 umol/L (ref 9–35)

## 2016-09-20 NOTE — Progress Notes (Addendum)
Pt has episode of agitation and confusion during the night that resolved, and pt went back to sleep.

## 2016-09-20 NOTE — Progress Notes (Addendum)
CSW spoke with patient's sister to explain SNF search process. Patient's sister would like to be involved and is able to sign patient's admission paperwork. CS[facility] has sent [facility] to [facility] for review. [facility] has denied patient.  Percell Locus Yuka Lallier LCSWA [phone]

## 2016-09-20 NOTE — Progress Notes (Addendum)
Progress Note    [name]  CBJ:[id number] DOB: [birth year]  DOA: 09/07/2016 PCP: Beverly Sessions    Brief Narrative:   Chief complaint: Follow-up AMS  Medical records reviewed and are as summarized below:  [name] is an 51 y.o. male with a PMH of PTSD, bipolar disorder, ADD, depression, anxiety, stage III CKD, history of seizures, and diabetes who was admitted 09/07/16 for evaluation of AMS/fall. He was noted to be noncompliant with his medications and to have been declining over the past year per his sister. Initial laboratory workup unremarkable other than elevated ammonia level and CT scan of the head showed no evidence of acute intracranial abnormalities. He was noted to be restless and possibly psychotic by nursing staff on admission.  Assessment/Plan:   Principal Problem:   Early onset dementia with advanced brain atrophy for age/Acute metabolic encephalopathy Initial workup revealed an elevated ammonia level, but folate, B-12, TSH levels were all WNL. CT of the head was unremarkable for acute findings. MRI of the brain also negative for acute findings, but did show advanced brain atrophy.Continues to have intermittent agitation/confusion. Repeat ammonia level. At this point, I suspect he has early onset dementia given significant brain atrophy for age.  It is unlikely that he will be able to live in an unsupervised environment, and his sister is unable to provide him with 24-hour supervision.  Active Problems:   Psychiatry illnesses including Bipolar disorder ([place]), depression, anxiety and PTSD Continue scheduled Ativan, Seroquel. Continue Haldol as needed. Evaluated by psychiatry and neurology.    Essential hypertension Continue propranolol. Norvasc discontinued due to borderline low blood pressure at times. Blood pressure currently well-controlled.    Diabetes type 2, uncontrolled ([place]) Evaluated by diabetes coordinator 09/16/16. Continue insulin sensitive SSI and 4 units  of meal coverage. CBGs 104-156.     Hypokalemia Repleted. Recheck today.    Seizure disorder Valproic acid level was therapeutic on admission. No documentation of recurrent seizures.    CRI (chronic renal insufficiency), stage 3 (moderate) Baseline creatinine appears to be 2.2-2.7. Creatinine stable at 2.0. Recheck today.    Fall Appears to be somewhat chronic with frequent falls noted. Evaluated by neurology with subsequent recommendations to proceed with MRI of brain and spine. Findings unremarkable. Truvada discontinued in case it was contributing to his neuropathy. Neurologist ultimately recommended outpatient follow-up for EMG/neuropsychiatric testing as well as SPEP testing. PT evaluated the patient 09/12/16 and recommended SNF placement/24 hour supervision.    Acute urinary retention Noted to have 1 L of retention on 09/09/16. I&O cathed PRN. Flomax started 09/12/16 with resolution of urinary retention.    Acute lower UTI Developed fever 09/16/16, thought to be from UTI given recent need for Foley catheterization for treatment of urinary retention. Empirically placed on ceftriaxone. Urine cultures positive for Escherichia coli, Rocephin sensitive.    HIV screening The patient falls between the ages of 13-64 and should be screened for HIV, therefore HIV testing ordered: Nonreactive.    Chronic thrombocytopenia Platelet count low noted to be low dating back to 2017.   Family Communication/Anticipated D/C date and plan/Code Status   DVT prophylaxis: Lovenox ordered. Code Status: Full Code.  Family Communication: No family present at the bedside. Sister updated by telephone. Disposition Plan: Had been living with his sister. Plan is for SNF placement or [facility] if his mobility improves.   Medical Consultants:    Psychiatry  Neurology   Procedures:    None  Anti-Infectives:  Rocephin 09/16/16---> 09/18/16   Subjective:   Noted by nursing staff to have an episode of  agitation and confusion during the night that resolved. Asleep on my visit, did not want to awaken to speak with me.  Objective:    Vitals:   09/19/16 0512 09/19/16 1608 09/19/16 2130 09/20/16 0554  BP: 113/68 136/73 110/66 130/71  Pulse: (!) 58 (!) 54 61 (!) 53  Resp: 16 18 16 20   Temp: 97.5 F (36.4 C) 97.4 F (36.3 C) 98.6 F (37 C) 97.8 F (36.6 C)  TempSrc: Oral Oral Oral Oral  SpO2: 98% 100% 95% 99%  Weight:      Height:        Intake/Output Summary (Last 24 hours) at 09/20/16 1325 Last data filed at 09/20/16 1308  Gross per 24 hour  Intake              360 ml  Output              100 ml  Net              260 ml   Filed Weights   09/10/16 1029 09/15/16 0517  Weight: 94.4 kg (208 lb 1.8 oz) 97.1 kg (214 lb 1.1 oz)    Exam: General exam: Asleep, no distress. Respiratory system: Clear to auscultation. Respiratory effort normal. Cardiovascular system: S1 & S2 heard, RRR. No JVD,  rubs, gallops or clicks. No murmurs. Gastrointestinal system: Abdomen is nondistended, soft and nontender. No organomegaly or masses felt. Normal bowel sounds heard. Central nervous system: Asleep, unable to assess. Extremities: No clubbing,  or cyanosis. No edema. Skin: No rashes, lesions or ulcers. Psychiatry: Asleep. Sitter reports no issues today.   Data Reviewed:   I have personally reviewed following labs and imaging studies:  Labs: Basic Metabolic Panel:  Recent Labs Lab 09/16/16 0800 09/20/16 0952  NA 134* 135  K 4.5 3.5  CL 106 102  CO2 19* 27  GLUCOSE 212* 142*  BUN 30* 34*  CREATININE 2.01* 2.02*  CALCIUM 9.1 8.9   GFR Estimated Creatinine Clearance: 54.9 mL/min (A) (by C-G formula based on SCr of 2.02 mg/dL (H)). Liver Function Tests:  Recent Labs Lab 09/20/16 0952  AST 23  ALT 19  ALKPHOS 74  BILITOT 0.5  PROT 6.6  ALBUMIN 3.2*   No results for input(s): LIPASE, AMYLASE in the last 168 hours.  Recent Labs Lab 09/17/16 0726 09/20/16 0952    AMMONIA 30 22   CBC:  Recent Labs Lab 09/15/16 2240 09/16/16 0800 09/17/16 0528 09/20/16 0952  WBC 13.0* 13.4* 11.5* 3.9*  NEUTROABS 9.1*  --   --   --   HGB 14.8 14.0 14.2 13.8  HCT 43.1 40.7 40.9 39.3  MCV 89.2 89.1 90.3 87.3  PLT 109* 92* 97* 123*   CBG:  Recent Labs Lab 09/19/16 1223 09/19/16 1636 09/19/16 2133 09/20/16 0755 09/20/16 1301  GLUCAP 104* 145* 145* 156* 165*   Sepsis Labs:  Recent Labs Lab 09/15/16 2240 09/16/16 0800 09/17/16 0528 09/20/16 0952  WBC 13.0* 13.4* 11.5* 3.9*  LATICACIDVEN 1.2  --   --   --     Microbiology Recent Results (from the past 240 hour(s))  Culture, blood (routine x 2)     Status: None (Preliminary result)   Collection Time: 09/15/16 10:45 PM  Result Value Ref Range Status   Specimen Description BLOOD RIGHT ARM  Final   Special Requests IN PEDIATRIC BOTTLE Blood Culture adequate volume  Final   Culture NO GROWTH 3 DAYS  Final   Report Status PENDING  Incomplete  Culture, blood (routine x 2)     Status: None (Preliminary result)   Collection Time: 09/15/16 10:59 PM  Result Value Ref Range Status   Specimen Description BLOOD LEFT ARM  Final   Special Requests   Final    BOTTLES DRAWN AEROBIC AND ANAEROBIC Blood Culture adequate volume   Culture NO GROWTH 3 DAYS  Final   Report Status PENDING  Incomplete  Culture, Urine     Status: Abnormal   Collection Time: 09/15/16 11:22 PM  Result Value Ref Range Status   Specimen Description URINE, CLEAN CATCH  Final   Special Requests NONE  Final   Culture >=100,000 COLONIES/mL ESCHERICHIA COLI (A)  Final   Report Status 09/18/2016 FINAL  Final   Organism ID, Bacteria ESCHERICHIA COLI (A)  Final      Susceptibility   Escherichia coli - MIC*    AMPICILLIN <=2 SENSITIVE Sensitive     CEFAZOLIN <=4 SENSITIVE Sensitive     CEFTRIAXONE <=1 SENSITIVE Sensitive     CIPROFLOXACIN <=0.25 SENSITIVE Sensitive     GENTAMICIN <=1 SENSITIVE Sensitive     IMIPENEM <=0.25 SENSITIVE  Sensitive     NITROFURANTOIN <=16 SENSITIVE Sensitive     TRIMETH/SULFA <=20 SENSITIVE Sensitive     AMPICILLIN/SULBACTAM <=2 SENSITIVE Sensitive     PIP/TAZO <=4 SENSITIVE Sensitive     Extended ESBL NEGATIVE Sensitive     * >=100,000 COLONIES/mL ESCHERICHIA COLI    Radiology: No results found.  Medications:   . cephALEXin  500 mg Oral Q12H  . divalproex  1,500 mg Oral QHS  . enoxaparin (LOVENOX) injection  40 mg Subcutaneous Q24H  . folic acid  1 mg Oral Daily  . insulin aspart  0-9 Units Subcutaneous TID WC  . insulin aspart  4 Units Subcutaneous TID WC  . LORazepam  0.5 mg Oral BID  . propranolol  20 mg Oral BID  . QUEtiapine  200 mg Oral QHS  . sodium chloride flush  3 mL Intravenous Q12H  . tamsulosin  0.4 mg Oral Daily  . testosterone  5 g Transdermal Daily  . traZODone  100 mg Oral QHS   Continuous Infusions:  Medical decision making is moderately complex therefore this is a level 2 visit. (> 3 problem points, 1 data point, moderate risk: Need 2 out of 3)   Problems/DDx Points   Self limited or minor (max 2)       1   Established problem, stable (Bipolar, DM, HTN, CKD)       1  4  Established problem, worsening       2   New problem, no additional W/U planned (max 1)       3  3  New problem, additional W/U planned        4    Data Reviewed Points   Review/order clinical lab tests       1  1  Review/order x-rays       1   Review/order tests (Echo, EKG, PFTs, etc)       1   Discussion of test results w/ performing MD       1   Independent review of image, tracing or specimen       2  2  Decision to obtain old records       1   Review and summation of old records  2    Level of risk Presenting prob Diagnostics Management   Minimal 1 self limited/minor Labs CXR EKG/EEG U/A U/S Rest Gargles Bandages Dressings   Low 2 or more self limited/minor 1 stable chronic Acute uncomplicated illness Tests (PFTS) Non-CV imaging Arterial labs Biopsies of skin  OTC drugs Minor surgery-no risk PT OT IVF without additives    Moderate 1 or more chronic illnesses w/ mild exac, progression or S/E from tx 2 or more stable chronic illnesses Undiagnosed new problem w/ uncertain prognosis Acute complicated injury  Stress tests Endoscopies with no risk factors Deep needle or incisional bx CV imaging without risk LP Thoracentesis Paracentesis Minor surgery w/ risks Elective major surgery w/ no risk (open, percutaneous or endoscopic) Prescription drugs Therapeutic nucl med IVF with additives Closed tx of fracture/dislocation  X  High Severe exac of chronic illness Acute or chronic illness/injury may pose a threat to life or bodily function (ARF) Change in neuro status    CV imaging w/ contrast and risk Cardio electophysiologic tests Endoscopies w/ risk Discography Elective major surgery Emergency major surgery Parenteral controlled substances Drug therapy req monitoring for toxicity DNR/de-escalation of care    MDM Prob points Data points Risk   Straightforward    <1    <1    Min   Low complexity    2    2    Low   Moderate    3    3    Mod   High Complexity    4 or more    4 or more    High        LOS: 13 days   Akshara Blumenthal  [facility] Pager [phone]. If unable to reach me by pager, please call my cell phone at [phone].  *Please refer to [email or web address], password [email or web address] to get updated schedule on who will round on this patient, as hospitalists switch teams weekly. If 7PM-7AM, please contact night-coverage at [email or web address], password [email or web address] for any overnight needs.  09/20/2016, 1:25 PM

## 2016-09-21 LAB — CULTURE, BLOOD (ROUTINE X 2)
CULTURE: NO GROWTH
CULTURE: NO GROWTH
Special Requests: ADEQUATE
Special Requests: ADEQUATE

## 2016-09-21 LAB — GLUCOSE, CAPILLARY
GLUCOSE-CAPILLARY: 130 mg/dL — AB (ref 65–99)
Glucose-Capillary: 143 mg/dL — ABNORMAL HIGH (ref 65–99)
Glucose-Capillary: 153 mg/dL — ABNORMAL HIGH (ref 65–99)
Glucose-Capillary: 181 mg/dL — ABNORMAL HIGH (ref 65–99)

## 2016-09-21 MED ORDER — POTASSIUM CHLORIDE CRYS ER 20 MEQ PO TBCR
20.0000 meq | EXTENDED_RELEASE_TABLET | Freq: Every day | ORAL | Status: DC
  Administered 2016-09-21 – 2016-10-13 (×23): 20 meq via ORAL
  Filled 2016-09-21 (×23): qty 1

## 2016-09-21 NOTE — Progress Notes (Signed)
Progress Note    [name]  PJA:[id number] DOB: Feb 04, 1966  DOA: 09/07/2016 PCP: [name]    Brief Narrative:   Chief complaint: Follow-up AMS  Medical records reviewed and are as summarized below:  [name] is an 51 y.o. male with a PMH of PTSD, bipolar disorder, ADD, depression, anxiety, stage III CKD, history of seizures, and diabetes who was admitted 09/07/16 for evaluation of AMS/fall. He was noted to be noncompliant with his medications and to have been declining over the past year per his sister. Initial laboratory workup unremarkable other than elevated ammonia level and CT scan of the head showed no evidence of acute intracranial abnormalities. He was noted to be restless and possibly psychotic by nursing staff on admission.  Assessment/Plan:   Principal Problem:   Early onset dementia with advanced brain atrophy for age/Acute metabolic encephalopathy Initial workup revealed an elevated ammonia level, but folate, B-12, TSH levels were all WNL. CT of the head was unremarkable for acute findings. MRI of the brain also negative for acute findings, but did show advanced brain atrophy.Continues to have intermittent agitation/confusion. Repeat ammonia level was within normal limits at 22. At this point, I suspect he has early onset dementia given significant brain atrophy for age.  It is unlikely that he will be able to live in an unsupervised environment, and his sister is unable to provide him with 24-hour supervision. Will discuss case with social work as the patient would likely benefit from psychiatric stabilization at this point. He is ambulatory in his room.  Active Problems:   Psychiatric illnesses including Bipolar disorder ([name]), depression, anxiety and PTSD Continue scheduled Ativan, Seroquel. Continue Haldol as needed. Evaluated by psychiatry and neurology. Seems a bit more agitated and paranoid today. Feels the staff are partying and not attentive to him.   Essential hypertension Continue propranolol. Norvasc discontinued due to borderline low blood pressure at times. Blood pressure currently well-controlled: 99-130/57-71.    Diabetes type 2, uncontrolled ([name]) Evaluated by diabetes coordinator 09/16/16. Continue insulin sensitive SSI and 4 units of meal coverage. CBGs 128-168.     Hypokalemia Repleted. Potassium rechecked 09/20/16 and was 3.5. We'll place on routine supplementation.    Seizure disorder Valproic acid level was therapeutic on admission. No documentation of recurrent seizures.    CRI (chronic renal insufficiency), stage 3 (moderate) Baseline creatinine appears to be 2.2-2.7. Creatinine remains stable at 2.02.    Fall Appears to be somewhat chronic with frequent falls noted. Evaluated by neurology with subsequent recommendations to proceed with MRI of brain and spine. Findings unremarkable. Truvada discontinued in case it was contributing to his neuropathy. Neurologist ultimately recommended outpatient follow-up for EMG/neuropsychiatric testing as well as SPEP testing. PT evaluated the patient 09/12/16 and recommended SNF placement/24 hour supervision. Social work continues to evaluate placement options.    Acute urinary retention Noted to have 1 L of retention on 09/09/16. I&O cathed PRN. Flomax started 09/12/16 with resolution of urinary retention.    Acute lower UTI Developed fever 09/16/16, thought to be from UTI given recent need for Foley catheterization for treatment of urinary retention. Empirically placed on ceftriaxone. Urine cultures positive for Escherichia coli, Rocephin sensitive. Completed therapy on 09/18/16.    HIV screening The patient falls between the ages of 13-64 and should be screened for HIV, therefore HIV testing ordered: Nonreactive.    Chronic thrombocytopenia Platelet count low noted to be low dating back to 2017.   Family Communication/Anticipated D/C date and  plan/Code Status   DVT prophylaxis:  Lovenox ordered. Code Status: Full Code.  Family Communication: No family present at the bedside. Sister updated by telephone 09/20/16. Disposition Plan: Had been living with his sister. Plan is for SNF placement or [facility] if his mobility improves.   Medical Consultants:    Psychiatry  Neurology   Procedures:    None  Anti-Infectives:    Rocephin 09/16/16---> 09/18/16   Subjective:   Patient is irritable today. He is mildly paranoid, says that the staff are partying outside of his room and are inattentive to him. Wants to go home. No physical complaints.  Objective:    Vitals:   09/20/16 0554 09/20/16 1417 09/20/16 2112 09/21/16 0605  BP: 130/71 102/60 122/63 (!) 99/57  Pulse: (!) 53 (!) 58 64 (!) 52  Resp: 20 20 18 17   Temp: 97.8 F (36.6 C) 97.7 F (36.5 C) 98 F (36.7 C) 98.4 F (36.9 C)  TempSrc: Oral Oral Oral Oral  SpO2: 99% 100% 97% 100%  Weight:      Height:        Intake/Output Summary (Last 24 hours) at 09/21/16 0800 Last data filed at 09/21/16 0715  Gross per 24 hour  Intake              960 ml  Output              280 ml  Net              680 ml   Filed Weights   09/10/16 1029 09/15/16 0517  Weight: 94.4 kg (208 lb 1.8 oz) 97.1 kg (214 lb 1.1 oz)    Exam: General exam: Mildly agitated. Respiratory system: Clear to auscultation. Respiratory effort normal. Cardiovascular system: S1 & S2 heard, RRR. No JVD,  rubs, gallops or clicks. No murmurs. Gastrointestinal system: Abdomen is nondistended, soft and nontender. No organomegaly or masses felt. Normal bowel sounds heard. Central nervous system: Alert and oriented to self. Nonfocal. Ambulating in room with mild unsteadiness of gait. Extremities: No clubbing,  or cyanosis. No edema. Skin: No rashes, lesions or ulcers. Psychiatry: Insight and judgment are impaired. Mood is depressed and affect is irritable.   Data Reviewed:   I have personally reviewed following labs and imaging  studies:  Labs: Basic Metabolic Panel:  Recent Labs Lab 09/16/16 0800 09/20/16 0952  NA 134* 135  K 4.5 3.5  CL 106 102  CO2 19* 27  GLUCOSE 212* 142*  BUN 30* 34*  CREATININE 2.01* 2.02*  CALCIUM 9.1 8.9   GFR Estimated Creatinine Clearance: 54.9 mL/min (A) (by C-G formula based on SCr of 2.02 mg/dL (H)). Liver Function Tests:  Recent Labs Lab 09/20/16 0952  AST 23  ALT 19  ALKPHOS 74  BILITOT 0.5  PROT 6.6  ALBUMIN 3.2*    Recent Labs Lab 09/17/16 0726 09/20/16 0952  AMMONIA 30 22   CBC:  Recent Labs Lab 09/15/16 2240 09/16/16 0800 09/17/16 0528 09/20/16 0952  WBC 13.0* 13.4* 11.5* 3.9*  NEUTROABS 9.1*  --   --   --   HGB 14.8 14.0 14.2 13.8  HCT 43.1 40.7 40.9 39.3  MCV 89.2 89.1 90.3 87.3  PLT 109* 92* 97* 123*   CBG:  Recent Labs Lab 09/20/16 0755 09/20/16 1301 09/20/16 1744 09/20/16 2109 09/21/16 0747  GLUCAP 156* 165* 128* 168* 130*   Sepsis Labs:  Recent Labs Lab 09/15/16 2240 09/16/16 0800 09/17/16 0528 09/20/16 0952  WBC 13.0* 13.4* 11.5* 3.9*  LATICACIDVEN 1.2  --   --   --     Microbiology Recent Results (from the past 240 hour(s))  Culture, blood (routine x 2)     Status: None (Preliminary result)   Collection Time: 09/15/16 10:45 PM  Result Value Ref Range Status   Specimen Description BLOOD RIGHT ARM  Final   Special Requests IN PEDIATRIC BOTTLE Blood Culture adequate volume  Final   Culture NO GROWTH 4 DAYS  Final   Report Status PENDING  Incomplete  Culture, blood (routine x 2)     Status: None (Preliminary result)   Collection Time: 09/15/16 10:59 PM  Result Value Ref Range Status   Specimen Description BLOOD LEFT ARM  Final   Special Requests   Final    BOTTLES DRAWN AEROBIC AND ANAEROBIC Blood Culture adequate volume   Culture NO GROWTH 4 DAYS  Final   Report Status PENDING  Incomplete  Culture, Urine     Status: Abnormal   Collection Time: 09/15/16 11:22 PM  Result Value Ref Range Status   Specimen  Description URINE, CLEAN CATCH  Final   Special Requests NONE  Final   Culture >=100,000 COLONIES/mL ESCHERICHIA COLI (A)  Final   Report Status 09/18/2016 FINAL  Final   Organism ID, Bacteria ESCHERICHIA COLI (A)  Final      Susceptibility   Escherichia coli - MIC*    AMPICILLIN <=2 SENSITIVE Sensitive     CEFAZOLIN <=4 SENSITIVE Sensitive     CEFTRIAXONE <=1 SENSITIVE Sensitive     CIPROFLOXACIN <=0.25 SENSITIVE Sensitive     GENTAMICIN <=1 SENSITIVE Sensitive     IMIPENEM <=0.25 SENSITIVE Sensitive     NITROFURANTOIN <=16 SENSITIVE Sensitive     TRIMETH/SULFA <=20 SENSITIVE Sensitive     AMPICILLIN/SULBACTAM <=2 SENSITIVE Sensitive     PIP/TAZO <=4 SENSITIVE Sensitive     Extended ESBL NEGATIVE Sensitive     * >=100,000 COLONIES/mL ESCHERICHIA COLI    Radiology: No results found.  Medications:   . cephALEXin  500 mg Oral Q12H  . divalproex  1,500 mg Oral QHS  . enoxaparin (LOVENOX) injection  40 mg Subcutaneous Q24H  . folic acid  1 mg Oral Daily  . insulin aspart  0-9 Units Subcutaneous TID WC  . insulin aspart  4 Units Subcutaneous TID WC  . LORazepam  0.5 mg Oral BID  . propranolol  20 mg Oral BID  . QUEtiapine  200 mg Oral QHS  . sodium chloride flush  3 mL Intravenous Q12H  . tamsulosin  0.4 mg Oral Daily  . testosterone  5 g Transdermal Daily  . traZODone  100 mg Oral QHS   Continuous Infusions:  Medical decision making is moderately complex therefore this is a level 2 visit. (> 3 problem points, 1 data point, moderate risk: Need 2 out of 3)   Problems/DDx Points   Self limited or minor (max 2)       1   Established problem, stable (Bipolar, DM, HTN, CKD)       1  4  Established problem, worsening       2   New problem, no additional W/U planned (max 1)       3  3  New problem, additional W/U planned        4    Data Reviewed Points   Review/order clinical lab tests       1  1  Review/order x-rays       1  Review/order tests (Echo, EKG, PFTs, etc)        1   Discussion of test results w/ performing MD       1   Independent review of image, tracing or specimen       2    Decision to obtain old records       1   Review and summation of old records       2    Level of risk Presenting prob Diagnostics Management   Minimal 1 self limited/minor Labs CXR EKG/EEG U/A U/S Rest Gargles Bandages Dressings   Low 2 or more self limited/minor 1 stable chronic Acute uncomplicated illness Tests (PFTS) Non-CV imaging Arterial labs Biopsies of skin OTC drugs Minor surgery-no risk PT OT IVF without additives    Moderate 1 or more chronic illnesses w/ mild exac, progression or S/E from tx 2 or more stable chronic illnesses Undiagnosed new problem w/ uncertain prognosis Acute complicated injury  Stress tests Endoscopies with no risk factors Deep needle or incisional bx CV imaging without risk LP Thoracentesis Paracentesis Minor surgery w/ risks Elective major surgery w/ no risk (open, percutaneous or endoscopic) Prescription drugs Therapeutic nucl med IVF with additives Closed tx of fracture/dislocation  X  High Severe exac of chronic illness Acute or chronic illness/injury may pose a threat to life or bodily function (ARF) Change in neuro status    CV imaging w/ contrast and risk Cardio electophysiologic tests Endoscopies w/ risk Discography Elective major surgery Emergency major surgery Parenteral controlled substances Drug therapy req monitoring for toxicity DNR/de-escalation of care    MDM Prob points Data points Risk   Straightforward    <1    <1    Min   Low complexity    2    2    Low   Moderate    3    3    Mod   High Complexity    4 or more    4 or more    High        LOS: 14 days   RAMA,CHRISTINA  [facility] Pager [phone]. If unable to reach me by pager, please call my cell phone at [phone].  *Please refer to [email or web address], password [email or web address] to get updated schedule on who will round on this patient, as  hospitalists switch teams weekly. If 7PM-7AM, please contact night-coverage at [email or web address], password [email or web address] for any overnight needs.  09/21/2016, 8:00 AM

## 2016-09-22 DIAGNOSIS — Z87898 Personal history of other specified conditions: Secondary | ICD-10-CM

## 2016-09-22 LAB — GLUCOSE, CAPILLARY
GLUCOSE-CAPILLARY: 194 mg/dL — AB (ref 65–99)
Glucose-Capillary: 224 mg/dL — ABNORMAL HIGH (ref 65–99)
Glucose-Capillary: 152 mg/dL — ABNORMAL HIGH (ref 65–99)
Glucose-Capillary: 162 mg/dL — ABNORMAL HIGH (ref 65–99)
Glucose-Capillary: 176 mg/dL — ABNORMAL HIGH (ref 65–99)

## 2016-09-22 NOTE — Consult Note (Signed)
[facility] Face-to-Face Psychiatry Consult   Reason for Consult:  AMS and history of Bipolar and PTSD Referring Physician:  Dr. Rockne Menghini Patient Identification: [name] MRN:  [id number] Principal Diagnosis: Dementia Diagnosis:   Patient Active Problem List   Diagnosis Date Noted  . History of seizures [Z87.898] 09/20/2016  . Dementia, early onset with advanced brain atrophy for age [F80.90] 09/20/2016  . Acute lower UTI [N39.0]   . Acute urinary retention [R33.8]   . Acute metabolic encephalopathy [T59.74] 09/07/2016  . HLD (hyperlipidemia) [E78.5] 09/07/2016  . GERD (gastroesophageal reflux disease) [K21.9] 09/07/2016  . PTSD (post-traumatic stress disorder) [F43.10] 09/07/2016  . Acute encephalopathy [G93.40] 09/07/2016  . Fall [W19.XXXA] 09/07/2016  . CRI (chronic renal insufficiency), stage 3 (moderate) [N18.3]   . Ataxia [R27.0] 10/14/2015  . Hypokalemia [E87.6] 10/03/2015  . Hemiparesis ([place]) [G81.90]   . Dyslipidemia [E78.5] 05/31/2014  . Insomnia [G47.00] 11/11/2013  . Degenerative joint disease (DJD) of hip [M16.9] 08/16/2013  . Erectile dysfunction [N52.9] 02/17/2011  . Constipation - functional [K59.04] 02/17/2011  . Diabetes type 2, uncontrolled ([place]) [E11.65] 11/06/2010  . Hypogonadism male [E29.1] 05/20/2010  . TOBACCO USER [F17.200] 05/20/2010  . Demoralization and apathy [R45.3] 05/20/2010  . Bipolar disorder ([place]) [F31.9] 01/18/2010  . Depression [F32.9] 01/18/2010  . Anxiety state [F41.1] 12/05/2006  . Essential hypertension [I10] 12/05/2006    Total Time spent with patient: 30 minutes  Subjective:   [name] is a 51 y.o. male admitted to [facility] with acute metabolic encephalopathy and history of frequent falls and unable to participate in his ADL's. He lives with his sister and her husband. He has a history of bipolar disorder, PTSD and substance use disorder and intermittent psychiatric treatment in the past. Psychiatric consultation requested for  possible disposition plans. Patient appeared sitting on his bed and continue to say he has confused and forgetful. He reports seeing dogs and other animals. He denied current suicide or homicide ideation, intention or plans. He has orientation to his name and being in hospital. He is aware that he has been falling while at home due to shaking his extremities and loosing balance. His speech and thought process has been somewhat improved since admitted to hospital. He continues to be poor historian and has questionable parkinson disease and dementia. He has no irritability, agitation or aggression but continue to endorses being confused.    Past Psychiatric History: History of bipolar disorder, PTSD, substance use disorder in the past. Patient also has history of 2 suicide attempts 1 a year ago in another 5 years ago  Risk to Self: Is patient at risk for suicide?: Yes Risk to Others:   Prior Inpatient Therapy:  history of 2 previous psychiatric hospitalizations. Prior Outpatient Therapy:    Past Medical History:  Past Medical History:  Diagnosis Date  . ADD (attention deficit disorder)   . Anxiety   . Arthritis    right hip  . Bipolar 1 disorder ([place])   . Blood in urine   . CKD (chronic kidney disease), stage III   . Creatinine elevation   . Depression    bipolar [facility]  . Diabetes mellitus without complication ([place])   . Family history of anesthesia complication    pt is unsure , but pt father may have been difficult to arouse   . HCAP (healthcare-associated pneumonia) 10/31/2012  . History of kidney stones   . Hypertension   . Hypogonadism male   . Liver fatty degeneration   .  Microscopic hematuria    hereditary s/p Urology eval  . Osteoarthritis of right hip 11/28/2011   2012 2015 s/p THR Severe  Dr Novella Olive    . Pleural effusion 11/02/2012  . Pneumonia 10-2012  . Pneumonia, organism unspecified([place]) 11/02/2012  . Polysubstance dependence, non-opioid, in remission ([place])     remote  . Primary osteoarthritis of left hip 05/22/2015  . PTSD (post-traumatic stress disorder)    SOCIAL ANXIETY DISORDER   . Suicide attempt by multiple drug overdose ([place]) 2016/01/09   Grieving his cat's death 27-Jul-2015    Past Surgical History:  Procedure Laterality Date  . BACK SURGERY    . CLOSED REDUCTION METACARPAL WITH PERCUTANEOUS PINNING Right   . LUMBAR DISC SURGERY    . TONSILLECTOMY    . TOTAL HIP ARTHROPLASTY Right 08/16/2013   Procedure: TOTAL HIP ARTHROPLASTY ANTERIOR APPROACH;  Surgeon: Hessie Dibble, MD;  Location: [facility];  Service: Orthopedics;  Laterality: Right;  . TOTAL HIP ARTHROPLASTY Left 05/22/2015   Procedure: TOTAL HIP ARTHROPLASTY ANTERIOR APPROACH;  Surgeon: Melrose Nakayama, MD;  Location: [facility];  Service: Orthopedics;  Laterality: Left;   Family History:  Family History  Problem Relation Age of Onset  . Diabetes Father   . Cancer Mother        died of melanoma with mets  . Cervical cancer Sister   . Diabetes Sister   . Other Neg Hx        hypogonadism   Family Psychiatric  History: Maternal grandfather was diagnosed with bipolar disorder and completed suicide. Mom is diagnosed with bipolar disorder and an uncle has Parkinson's disease Social History:  History  Alcohol Use No     History  Drug Use No    Social History   Social History  . Marital status: Single    Spouse name: N/A  . Number of children: N/A  . Years of education: N/A   Occupational History  . [employer] [place]   Social History Main Topics  . Smoking status: Former Smoker    Years: 0.00    Types: Cigarettes    Quit date: 02/19/2014  . Smokeless tobacco: Never Used  . Alcohol use No  . Drug use: No  . Sexual activity: Not Currently   Other Topics Concern  . None   Social History Narrative   regualar exercise-no   Additional Social History:    Allergies:   Allergies  Allergen Reactions  . Vicodin [Hydrocodone-Acetaminophen] Itching    Labs:   Results for orders placed or performed during the hospital encounter of 09/07/16 (from the past 48 hour(s))  Glucose, capillary     Status: Abnormal   Collection Time: 09/20/16  1:01 PM  Result Value Ref Range   Glucose-Capillary 165 (H) 65 - 99 mg/dL  Glucose, capillary     Status: Abnormal   Collection Time: 09/20/16  5:44 PM  Result Value Ref Range   Glucose-Capillary 128 (H) 65 - 99 mg/dL  Glucose, capillary     Status: Abnormal   Collection Time: 09/20/16  9:09 PM  Result Value Ref Range   Glucose-Capillary 168 (H) 65 - 99 mg/dL  Glucose, capillary     Status: Abnormal   Collection Time: 09/21/16  7:47 AM  Result Value Ref Range   Glucose-Capillary 130 (H) 65 - 99 mg/dL  Glucose, capillary     Status: Abnormal   Collection Time: 09/21/16 11:39 AM  Result Value Ref Range   Glucose-Capillary 143 (H) 65 -  99 mg/dL  Glucose, capillary     Status: Abnormal   Collection Time: 09/21/16  4:46 PM  Result Value Ref Range   Glucose-Capillary 153 (H) 65 - 99 mg/dL  Glucose, capillary     Status: Abnormal   Collection Time: 09/21/16  9:01 PM  Result Value Ref Range   Glucose-Capillary 181 (H) 65 - 99 mg/dL  Glucose, capillary     Status: Abnormal   Collection Time: 09/22/16  7:37 AM  Result Value Ref Range   Glucose-Capillary 152 (H) 65 - 99 mg/dL  Glucose, capillary     Status: Abnormal   Collection Time: 09/22/16 11:46 AM  Result Value Ref Range   Glucose-Capillary 162 (H) 65 - 99 mg/dL    Current Facility-Administered Medications  Medication Dose Route Frequency Provider Last Rate Last Dose  . acetaminophen (TYLENOL) tablet 650 mg  650 mg Oral Q6H PRN Rise Patience, MD   650 mg at 09/15/16 2252  . cephALEXin (KEFLEX) capsule 500 mg  500 mg Oral Q12H Rosita Fire, MD   500 mg at 09/22/16 7893  . divalproex (DEPAKOTE) DR tablet 1,500 mg  1,500 mg Oral QHS Ivor Costa, MD   1,500 mg at 09/21/16 2201  . enoxaparin (LOVENOX) injection 40 mg  40 mg Subcutaneous Q24H  Rosita Fire, MD   40 mg at 09/22/16 1036  . folic acid (FOLVITE) tablet 1 mg  1 mg Oral Daily Amin, Ankit Chirag, MD   1 mg at 09/22/16 8101  . haloperidol lactate (HALDOL) injection 5 mg  5 mg Intramuscular Q6H PRN Rosita Fire, MD      . hydrALAZINE (APRESOLINE) injection 5 mg  5 mg Intravenous Q2H PRN Ivor Costa, MD      . insulin aspart (novoLOG) injection 0-9 Units  0-9 Units Subcutaneous TID WC Schorr, Rhetta Mura, NP   2 Units at 09/22/16 1209  . insulin aspart (novoLOG) injection 4 Units  4 Units Subcutaneous TID WC Rama, Venetia Maxon, MD   4 Units at 09/22/16 1209  . LORazepam (ATIVAN) tablet 0.5 mg  0.5 mg Oral BID Rosita Fire, MD   0.5 mg at 09/22/16 7510  . ondansetron (ZOFRAN) tablet 4 mg  4 mg Oral Q6H PRN Ivor Costa, MD       Or  . ondansetron [facility]) injection 4 mg  4 mg Intravenous Q6H PRN Ivor Costa, MD      . oxyCODONE-acetaminophen (PERCOCET/ROXICET) 5-325 MG per tablet 1-2 tablet  1-2 tablet Oral Q4H PRN Ivor Costa, MD   1 tablet at 09/21/16 2101  . potassium chloride SA (K-DUR,KLOR-CON) CR tablet 20 mEq  20 mEq Oral Daily Rama, Venetia Maxon, MD   20 mEq at 09/22/16 0823  . propranolol (INDERAL) tablet 20 mg  20 mg Oral BID Ivor Costa, MD   20 mg at 09/21/16 2201  . QUEtiapine (SEROQUEL) tablet 200 mg  200 mg Oral QHS Amin, Ankit Chirag, MD   200 mg at 09/21/16 2159  . sodium chloride flush (NS) 0.9 % injection 3 mL  3 mL Intravenous Q12H Ivor Costa, MD   3 mL at 09/22/16 0824  . tamsulosin (FLOMAX) capsule 0.4 mg  0.4 mg Oral Daily Rosita Fire, MD   0.4 mg at 09/22/16 2585  . testosterone (ANDROGEL) 50 MG/5GM (1%) gel 5 g  5 g Transdermal Daily Rosita Fire, MD   5 g at 09/22/16 2778  . traZODone (DESYREL) tablet 100 mg  100 mg Oral  QHS Damita Lack, MD   100 mg at 09/21/16 2158    Musculoskeletal: Strength & Muscle Tone: within normal limits Gait & Station: unable to stand Patient leans: N/A  Psychiatric Specialty  Exam: Physical Exam   Review of Systems  Psychiatric/Behavioral: Positive for depression and substance abuse. Negative for hallucinations and suicidal ideas. The patient is nervous/anxious and has insomnia.   All other systems reviewed and are negative.   Blood pressure 105/62, pulse (!) 52, temperature 97.5 F (36.4 C), resp. rate 18, height 6\' 4"  (1.93 m), weight 97.1 kg (214 lb 1.1 oz), SpO2 100 %.Body mass index is 26.06 kg/m.  General Appearance: Disheveled  Eye Contact:  Fair  Speech:  Clear and Coherent and Slow  Volume:  Normal  Mood:  Anxious and Depressed  Affect:  Congruent and Restricted  Thought Process:  Disorganized and Descriptions of Associations: Tangential  Orientation:  Other:  self  Thought Content:  Illogical and Tangential  Suicidal Thoughts:  No  Homicidal Thoughts:  No  Memory:  Immediate;   Poor Recent;   Poor Remote;   Poor  Judgement:  Impaired  Insight:  Lacking  Psychomotor Activity:  Mannerisms  Concentration:  Concentration: Fair and Attention Span: Fair  Recall:  [employer] of Knowledge:  Fair  Language:  Good  Akathisia:  No  Handed:  Right  AIMS (if indicated):     Assets:  Housing Social Support  ADL's:  Impaired  Cognition:  Impaired,  Moderate  Sleep:      Treatment Plan Summary: patient has been stable with his current medication treatment and has no acute symptoms of depression, anxiety, mania or psychosis. He adamantly denies suicide or homicide ideation, intention or plans. He continues to be some what confused, forgetful which seems to be chronic in nature and required out patient treatment for dementia and possible parkinson symptoms.   Dementia Delirium NOS - resolved  Plan: Continue Seroquel to 200 mg QHS for mood swings, Monitor for QT/QTC prolongation Continue Trazodone to 100 mg QHS for insomnia Continue Ativan 0.5 mg twice a day/ for agitation and anxiety - needs to use minimum dosage due to higher dosage may increased  confusion and fall risk associated with drowsiness Continue Haldol 5 mg every 6 when necessary agitation and combative behaviors only Continue Depakote 1500 mg QHS for mood swings/bipolar disorder Monitor for the valproic acid level to prevent toxicity and hepatic functions more often  Appreciate psychiatric consultation and we sign off as of today Please contact [phone] or [phone] if needs further assistance   Disposition:  Patient does not meet criteria for acute psych admission as his presentation seems to be chronic with acute exacerbation, which was resolved with current treatment during his hospital stay Patient will be discharged to his sister home when medically stable Patient will be referred to out patient psychiatric medication management Patient benefit from physical and occupational assessment He may benefit from out of home placement when family won't able to care for him or home health care.    Ambrose Finland, MD 09/22/2016 12:42 PM

## 2016-09-22 NOTE — Progress Notes (Signed)
Physical Therapy Treatment Patient Details Name: [name] MRN: [id number] DOB: 01-20-1966 Today's Date: 09/22/2016    History of Present Illness 51 y.o.malewith history significant of PTSD, bipolar disorder, ADD, depression, anxiety, GERD, CKD stage III, seizure d/o, HTN, HLD, and DM who presentedwith altered mental status. The patient's sister reported he had been cognitively declining for the past year w/ multiple falls at home. Non - compliant w/ his medications per family, more confused and restless, reporting auditory and visual hallucinations. CT head in the ED was w/o acute findings.     PT Comments    Patient progressing with balance activities and endurance.  Remains at high risk for falls, however, with some evidence for decreased vestibular use as recollects falling a lot with eyes closed or stepping in/out of shower on slippery surface.  Also slight dizziness after balance activities this session.  Continue to feel pt appropriate for post acute inpatient rehab at SNF level of care.  Patient will benefit from continued skilled PT during acute stay.   Follow Up Recommendations  SNF;Supervision/Assistance - 24 hour     Equipment Recommendations  Other (comment)    Recommendations for Other Services       Precautions / Restrictions Precautions Precautions: Fall    Mobility  Bed Mobility Overal bed mobility: Modified Independent             General bed mobility comments: increased time to upright with effort  Transfers Overall transfer level: Needs assistance Equipment used: None Transfers: Sit to/from Stand Sit to Stand: Min guard         General transfer comment: assist for balance  Ambulation/Gait Ambulation/Gait assistance: Min guard;Min assist Ambulation Distance (Feet): 160 Feet Assistive device: None;1 person hand held assist Gait Pattern/deviations: Step-through pattern;Decreased stride length;Trunk flexed;Drifts right/left;Shuffle;Decreased  dorsiflexion - right     General Gait Details: flexed posture, without walker, pt seems unstable reaching for items to hold in hallway and on return provided HHA.     Stairs            Wheelchair Mobility    Modified Rankin (Stroke Patients Only)       Balance Overall balance assessment: Needs assistance Sitting-balance support: No upper extremity supported;Feet supported Sitting balance-Leahy Scale: Good     Standing balance support: No upper extremity supported Standing balance-Leahy Scale: Fair Standing balance comment: static balance without UE support, was unsteady with ambulation without device               High Level Balance Comments: at wall railing in hallway performed sidestepping, forward march, backwards walking, heel and toe walking and working on limits of stability with wall bumps and ant/post rocking with UE support            Cognition Arousal/Alertness: Awake/alert Behavior During Therapy: WFL for tasks assessed/performed Overall Cognitive Status: Within Functional Limits for tasks assessed                                        Exercises      General Comments        Pertinent Vitals/Pain Faces Pain Scale: Hurts a little bit Pain Location: back with some balance activities Pain Descriptors / Indicators: Discomfort Pain Intervention(s): Limited activity within patient's tolerance;Monitored during session    Home Living  Prior Function            PT Goals (current goals can now be found in the care plan section) Progress towards PT goals: Progressing toward goals    Frequency    Min 3X/week      PT Plan Current plan remains appropriate    Co-evaluation              AM-PAC PT "6 Clicks" Daily Activity  Outcome Measure  Difficulty turning over in bed (including adjusting bedclothes, sheets and blankets)?: None Difficulty moving from lying on back to sitting on the  side of the bed? : None Difficulty sitting down on and standing up from a chair with arms (e.g., wheelchair, bedside commode, etc,.)?: Total Help needed moving to and from a bed to chair (including a wheelchair)?: A Little Help needed walking in hospital room?: A Little Help needed climbing 3-5 steps with a railing? : A Little 6 Click Score: 18    End of Session Equipment Utilized During Treatment: Gait belt Activity Tolerance: Patient tolerated treatment well Patient left: in bed;with nursing/sitter in room (sitting EOB)   PT Visit Diagnosis: Repeated falls (R29.6);Other abnormalities of gait and mobility (R26.89)     Time: 1445-1510 PT Time Calculation (min) (ACUTE ONLY): 25 min  Charges:  $Gait Training: 8-22 mins $Neuromuscular Re-education: 8-22 mins                    G CodesMagda Kiel, [place] [phone] 09/22/2016    Reginia Naas 09/22/2016, 3:56 PM

## 2016-09-22 NOTE — Progress Notes (Signed)
Progress Note    [name]  TMH:[id number] DOB: 11-23-1965  DOA: 09/07/2016 PCP: Beverly Sessions    Brief Narrative:   Chief complaint: Follow-up AMS  Medical records reviewed and are as summarized below:  [name] is an 51 y.o. male with a PMH of PTSD, bipolar disorder, ADD, depression, anxiety, stage III CKD, history of seizures, and diabetes who was admitted 09/07/16 for evaluation of AMS/fall. He was noted to be noncompliant with his medications and to have been declining over the past year per his sister. Initial laboratory workup unremarkable other than elevated ammonia level and CT scan of the head showed no evidence of acute intracranial abnormalities. He was noted to be restless and possibly psychotic by nursing staff on admission.  Assessment/Plan:   Principal Problem:   Early onset dementia with advanced brain atrophy for age/Acute metabolic encephalopathy Initial workup revealed an elevated ammonia level, but folate, B-12, TSH levels were all WNL. CT of the head was unremarkable for acute findings. MRI of the brain also negative for acute findings, but did show advanced brain atrophy.Continues to have intermittent agitation/confusion. Repeat ammonia level was within normal limits at 22. At this point, I suspect he has early onset dementia given significant brain atrophy for age.  It is unlikely that he will be able to live in an unsupervised environment, and his sister is unable to provide him with 24-hour supervision. Discussed with case Freight forwarder. Discussed with psychiatrist, who will reevaluate the patient to assist with disposition.  He is ambulatory in his room.  Active Problems:   Psychiatric illnesses including Bipolar disorder ([place]), depression, anxiety and PTSD Continue scheduled Ativan, Seroquel. Continue Haldol as needed. Evaluated by psychiatry and neurology. Was noted to be agitated and paranoid 09/21/16. Need psychiatry reevaluation.    Essential  hypertension Continue propranolol. Norvasc discontinued due to borderline low blood pressure at times. Blood pressure currently well-controlled.    Diabetes type 2, uncontrolled ([place]) Evaluated by diabetes coordinator 09/16/16. Continue insulin sensitive SSI and 4 units of meal coverage. CBGs 130-181.     Hypokalemia Repleted, now on routine supplementation.     Seizure disorder Valproic acid level was therapeutic on admission. No documentation of recurrent seizures.    CRI (chronic renal insufficiency), stage 3 (moderate)  Baseline creatinine appears to be 2.2-2.7. Creatinine remains stable at 2.02.    Fall Appears to be somewhat chronic with frequent falls noted. Evaluated by neurology with subsequent recommendations to proceed with MRI of brain and spine. Findings unremarkable. Truvada discontinued in case it was contributing to his neuropathy. Neurologist ultimately recommended outpatient follow-up for EMG/neuropsychiatric testing as well as SPEP testing. PT evaluated the patient 09/12/16 and recommended SNF placement/24 hour supervision. Social work continues to evaluate placement options.    Acute urinary retention Noted to have 1 L of retention on 09/09/16. I&O cathed PRN. Flomax started 09/12/16 with resolution of urinary retention.    Acute lower UTI Developed fever 09/16/16, thought to be from UTI given recent need for Foley catheterization for treatment of urinary retention. Empirically placed on ceftriaxone. Urine cultures positive for Escherichia coli, Rocephin sensitive. Completed therapy on 09/18/16.    HIV screening The patient falls between the ages of 13-64 and should be screened for HIV, therefore HIV testing ordered: Nonreactive.    Chronic thrombocytopenia Platelet count low noted to be low dating back to 2017.   Family Communication/Anticipated D/C date and plan/Code Status   DVT prophylaxis: Lovenox ordered. Code Status: Full Code.  Family Communication: No family  present at the bedside. Sister updated by telephone 09/20/16. Disposition Plan: Had been living with his sister. Plan is for SNF placement or [facility] if his mobility improves.   Medical Consultants:    Psychiatry  Neurology   Procedures:    None  Anti-Infectives:    Rocephin 09/16/16---> 09/18/16   Subjective:   Patient is disengaged day. Minimally verbal. No physical complaints.  Objective:    Vitals:   09/21/16 0605 09/21/16 1551 09/21/16 2102 09/22/16 0501  BP: (!) 99/57 123/76 127/82 105/62  Pulse: (!) 52 64 70 (!) 52  Resp: 17 18 18 18   Temp: 98.4 F (36.9 C) 98 F (36.7 C) 98.5 F (36.9 C) 97.5 F (36.4 C)  TempSrc: Oral Oral Oral   SpO2: 100% 100% 98% 100%  Weight:      Height:       No intake or output data in the 24 hours ending 09/22/16 0748 Filed Weights   09/10/16 1029 09/15/16 0517  Weight: 94.4 kg (208 lb 1.8 oz) 97.1 kg (214 lb 1.1 oz)    Exam: General exam: Disengaged. Minimally verbal. Respiratory system: Clear to auscultation bilaterally. Normal respiratory effort.  Cardiovascular system: Regular rate, and rhythm. No murmurs, rubs, or gallops. Gastrointestinal system: Soft, nontender, nondistended with normal active bowel sounds. Central nervous system: Sleepy but awakens easily. Remains slightly disoriented, otherwise nonfocal. Extremities: No clubbing, edema, or cyanosis. Skin: No rashes, lesions, or ulcers. Psychiatry: Impaired insight and judgment. Mood remains depressed with flat affect.   Data Reviewed:   I have personally reviewed following labs and imaging studies:  Labs: Basic Metabolic Panel:  Recent Labs Lab 09/16/16 0800 09/20/16 0952  NA 134* 135  K 4.5 3.5  CL 106 102  CO2 19* 27  GLUCOSE 212* 142*  BUN 30* 34*  CREATININE 2.01* 2.02*  CALCIUM 9.1 8.9   GFR Estimated Creatinine Clearance: 54.9 mL/min (A) (by C-G formula based on SCr of 2.02 mg/dL (H)). Liver Function Tests:  Recent Labs Lab 09/20/16 0952   AST 23  ALT 19  ALKPHOS 74  BILITOT 0.5  PROT 6.6  ALBUMIN 3.2*    Recent Labs Lab 09/17/16 0726 09/20/16 0952  AMMONIA 30 22   CBC:  Recent Labs Lab 09/15/16 2240 09/16/16 0800 09/17/16 0528 09/20/16 0952  WBC 13.0* 13.4* 11.5* 3.9*  NEUTROABS 9.1*  --   --   --   HGB 14.8 14.0 14.2 13.8  HCT 43.1 40.7 40.9 39.3  MCV 89.2 89.1 90.3 87.3  PLT 109* 92* 97* 123*   CBG:  Recent Labs Lab 09/21/16 0747 09/21/16 1139 09/21/16 1646 09/21/16 2101 09/22/16 0737  GLUCAP 130* 143* 153* 181* 152*   Sepsis Labs:  Recent Labs Lab 09/15/16 2240 09/16/16 0800 09/17/16 0528 09/20/16 0952  WBC 13.0* 13.4* 11.5* 3.9*  LATICACIDVEN 1.2  --   --   --     Microbiology Recent Results (from the past 240 hour(s))  Culture, blood (routine x 2)     Status: None   Collection Time: 09/15/16 10:45 PM  Result Value Ref Range Status   Specimen Description BLOOD RIGHT ARM  Final   Special Requests IN PEDIATRIC BOTTLE Blood Culture adequate volume  Final   Culture NO GROWTH 5 DAYS  Final   Report Status 09/21/2016 FINAL  Final  Culture, blood (routine x 2)     Status: None   Collection Time: 09/15/16 10:59 PM  Result Value Ref Range Status  Specimen Description BLOOD LEFT ARM  Final   Special Requests   Final    BOTTLES DRAWN AEROBIC AND ANAEROBIC Blood Culture adequate volume   Culture NO GROWTH 5 DAYS  Final   Report Status 09/21/2016 FINAL  Final  Culture, Urine     Status: Abnormal   Collection Time: 09/15/16 11:22 PM  Result Value Ref Range Status   Specimen Description URINE, CLEAN CATCH  Final   Special Requests NONE  Final   Culture >=100,000 COLONIES/mL ESCHERICHIA COLI (A)  Final   Report Status 09/18/2016 FINAL  Final   Organism ID, Bacteria ESCHERICHIA COLI (A)  Final      Susceptibility   Escherichia coli - MIC*    AMPICILLIN <=2 SENSITIVE Sensitive     CEFAZOLIN <=4 SENSITIVE Sensitive     CEFTRIAXONE <=1 SENSITIVE Sensitive     CIPROFLOXACIN <=0.25  SENSITIVE Sensitive     GENTAMICIN <=1 SENSITIVE Sensitive     IMIPENEM <=0.25 SENSITIVE Sensitive     NITROFURANTOIN <=16 SENSITIVE Sensitive     TRIMETH/SULFA <=20 SENSITIVE Sensitive     AMPICILLIN/SULBACTAM <=2 SENSITIVE Sensitive     PIP/TAZO <=4 SENSITIVE Sensitive     Extended ESBL NEGATIVE Sensitive     * >=100,000 COLONIES/mL ESCHERICHIA COLI    Radiology: No results found.  Medications:   . cephALEXin  500 mg Oral Q12H  . divalproex  1,500 mg Oral QHS  . enoxaparin (LOVENOX) injection  40 mg Subcutaneous Q24H  . folic acid  1 mg Oral Daily  . insulin aspart  0-9 Units Subcutaneous TID WC  . insulin aspart  4 Units Subcutaneous TID WC  . LORazepam  0.5 mg Oral BID  . potassium chloride  20 mEq Oral Daily  . propranolol  20 mg Oral BID  . QUEtiapine  200 mg Oral QHS  . sodium chloride flush  3 mL Intravenous Q12H  . tamsulosin  0.4 mg Oral Daily  . testosterone  5 g Transdermal Daily  . traZODone  100 mg Oral QHS   Continuous Infusions:  Medical decision making is moderately complex therefore this is a level 2 visit. (> 3 problem points, 0 data points, moderate risk: Need 2 out of 3)   Problems/DDx Points   Self limited or minor (max 2)       1   Established problem, stable (Bipolar, DM, HTN, CKD)       1  4  Established problem, worsening       2   New problem, no additional W/U planned (max 1)       3  3  New problem, additional W/U planned        4    Data Reviewed Points   Review/order clinical lab tests       1    Review/order x-rays       1   Review/order tests (Echo, EKG, PFTs, etc)       1   Discussion of test results w/ performing MD       1   Independent review of image, tracing or specimen       2    Decision to obtain old records       1   Review and summation of old records       2    Level of risk Presenting prob Diagnostics Management   Minimal 1 self limited/minor Labs CXR EKG/EEG U/A U/S Rest Gargles Bandages Dressings   Low 2  or  more self limited/minor 1 stable chronic Acute uncomplicated illness Tests (PFTS) Non-CV imaging Arterial labs Biopsies of skin OTC drugs Minor surgery-no risk PT OT IVF without additives    Moderate 1 or more chronic illnesses w/ mild exac, progression or S/E from tx 2 or more stable chronic illnesses Undiagnosed new problem w/ uncertain prognosis Acute complicated injury  Stress tests Endoscopies with no risk factors Deep needle or incisional bx CV imaging without risk LP Thoracentesis Paracentesis Minor surgery w/ risks Elective major surgery w/ no risk (open, percutaneous or endoscopic) Prescription drugs Therapeutic nucl med IVF with additives Closed tx of fracture/dislocation  X  High Severe exac of chronic illness Acute or chronic illness/injury may pose a threat to life or bodily function (ARF) Change in neuro status    CV imaging w/ contrast and risk Cardio electophysiologic tests Endoscopies w/ risk Discography Elective major surgery Emergency major surgery Parenteral controlled substances Drug therapy req monitoring for toxicity DNR/de-escalation of care    MDM Prob points Data points Risk   Straightforward    <1    <1    Min   Low complexity    2    2    Low   Moderate    3    3    Mod  x  High Complexity    4 or more    4 or more    High        LOS: 15 days   Kavontae Pritchard  [facility] Pager [phone]. If unable to reach me by pager, please call my cell phone at [phone].  *Please refer to [email or web address], password [email or web address] to get updated schedule on who will round on this patient, as hospitalists switch teams weekly. If 7PM-7AM, please contact night-coverage at [email or web address], password [email or web address] for any overnight needs.  09/22/2016, 7:48 AM

## 2016-09-23 LAB — GLUCOSE, CAPILLARY
GLUCOSE-CAPILLARY: 167 mg/dL — AB (ref 65–99)
GLUCOSE-CAPILLARY: 186 mg/dL — AB (ref 65–99)
Glucose-Capillary: 183 mg/dL — ABNORMAL HIGH (ref 65–99)
Glucose-Capillary: 226 mg/dL — ABNORMAL HIGH (ref 65–99)

## 2016-09-23 MED ORDER — INSULIN ASPART 100 UNIT/ML ~~LOC~~ SOLN
5.0000 [IU] | Freq: Three times a day (TID) | SUBCUTANEOUS | Status: DC
  Administered 2016-09-23 – 2016-09-29 (×19): 5 [IU] via SUBCUTANEOUS

## 2016-09-23 NOTE — Progress Notes (Signed)
Occupational Therapy Treatment Patient Details Name: [name] MRN: [id number] DOB: 07-15-1965 Today's Date: 09/23/2016    History of present illness 51 y.o.malewith history significant of PTSD, bipolar disorder, ADD, depression, anxiety, GERD, CKD stage III, seizure d/o, HTN, HLD, and DM who presentedwith altered mental status. The patient's sister reported he had been cognitively declining for the past year w/ multiple falls at home. Non - compliant w/ his medications per family, more confused and restless, reporting auditory and visual hallucinations. CT head in the ED was w/o acute findings.    OT comments  This 51 yo male admitted with above presents to acute OT with making progress towards goals-he is at an overall min guard A level when he is up on his feet. He will continue to benefit from acute OT with follow up OT at SNF.   Follow Up Recommendations  SNF;Supervision/Assistance - 24 hour    Equipment Recommendations  Other (comment) (TBD at next venue)       Precautions / Restrictions Precautions Precautions: Fall Restrictions Weight Bearing Restrictions: No       Mobility Bed Mobility Overal bed mobility: Independent                Transfers Overall transfer level: Needs assistance Equipment used: None Transfers: Sit to/from Stand Sit to Stand: Min guard                  ADL either performed or assessed with clinical judgement   ADL Overall ADL's : Needs assistance/impaired     Grooming: Wash/dry face;Oral care;Min guard;Standing                   Toilet Transfer: Min guard;Ambulation;RW Toilet Transfer Details (indicate cue type and reason): bed>out door and down hall (125 feet)>stand at sink to groom>sit EOB           General ADL Comments: Pt reports that it is really difficult to get LBD due to past surgical history--he would benefit from the use of a reacher/sock aid, but we cannot give them to him at present due to suicide  precautions.     Vision Patient Visual Report: No change from baseline            Cognition Arousal/Alertness: Awake/alert Behavior During Therapy: WFL for tasks assessed/performed Overall Cognitive Status: Within Functional Limits for tasks assessed                                                     Pertinent Vitals/ Pain       Pain Assessment: Faces Faces Pain Scale: Hurts a little bit Pain Location: bil heels (reports he had injury many years ago where he landed hard on his heels) Pain Descriptors / Indicators: Sore;Guarding Pain Intervention(s): Limited activity within patient's tolerance;Repositioned         Frequency  Min 2X/week        Progress Toward Goals  OT Goals(current goals can now be found in the care plan section)  Progress towards OT goals: Progressing toward goals     Plan Discharge plan remains appropriate       AM-PAC PT "6 Clicks" Daily Activity     Outcome Measure   Help from another person eating meals?: None Help from another person taking care of personal grooming?: A Little Help from another  person toileting, which includes using toliet, bedpan, or urinal?: A Little Help from another person bathing (including washing, rinsing, drying)?: A Little Help from another person to put on and taking off regular upper body clothing?: A Little Help from another person to put on and taking off regular lower body clothing?: A Lot 6 Click Score: 18    End of Session Equipment Utilized During Treatment: Gait belt;Rolling walker  OT Visit Diagnosis: Unsteadiness on feet (R26.81);Repeated falls (R29.6);Muscle weakness (generalized) (M62.81);History of falling (Z91.81)   Activity Tolerance Patient tolerated treatment well   Patient Left  (sitting EOB with sitter present)   Nurse Communication          Time: 6759-1638 OT Time Calculation (min): 20 min  Charges: OT General Charges $OT Visit: 1 Procedure OT  Evaluation $OT Eval Moderate Complexity: 1 Procedure  [place] [address], OTR/L [phone] 09/23/2016

## 2016-09-23 NOTE — Progress Notes (Signed)
Progress Note    [name]  QQV:[id number] DOB: Feb 17, 1966  DOA: 09/07/2016 PCP: Beverly Sessions    Brief Narrative:   Chief complaint: Follow-up AMS  Medical records reviewed and are as summarized below:  STONEWALL DOSS is an 51 y.o. male with a PMH of PTSD, bipolar disorder, ADD, depression, anxiety, stage III CKD, history of seizures, and diabetes who was admitted 09/07/16 for evaluation of AMS/fall. He was noted to be noncompliant with his medications and to have been declining over the past year per his sister. Initial laboratory workup unremarkable other than elevated ammonia level and CT scan of the head showed no evidence of acute intracranial abnormalities. He was noted to be restless and possibly psychotic by nursing staff on admission.  Assessment/Plan:   Principal Problem:   Early onset dementia with advanced brain atrophy for age/Acute metabolic encephalopathy Initial workup revealed an elevated ammonia level, but folate, B-12, TSH levels were all WNL. CT of the head was unremarkable for acute findings. MRI of the brain also negative for acute findings, but did show advanced brain atrophy.Continues to have intermittent agitation/confusion. Repeat ammonia level was within normal limits at 22. At this point, I suspect he has early onset dementia given significant brain atrophy for age.  It is unlikely that he will be able to live in an unsupervised environment, and his sister is unable to provide him with 24-hour supervision. Discussed with case Freight forwarder. Discussed with psychiatrist, who will reevaluate the patient to assist with disposition.  He is ambulatory in his room. Spoke with Dr. Louretta Shorten regarding his consultation 09/22/16. He tells me that the patient is not a candidate for inpatient psychiatric care.   Active Problems:   Psychiatric illnesses including Bipolar disorder ([place]), depression, anxiety and PTSD Continue scheduled Seroquel. Continue Haldol as needed. Evaluated by  psychiatry and neurology. Was noted to be agitated and paranoid 09/21/16. Ativan discontinued per psychiatry recommendations.    Essential hypertension Stable on propranolol. Norvasc discontinued due to borderline low blood pressure at times.     Diabetes type 2, uncontrolled ([place]) Evaluated by diabetes coordinator 09/16/16. Continue insulin sensitive SSI and 4 units of meal coverage. CBGs 152-194. Not optimally controlled. Will increase meal coverage to 5 units.     Hypokalemia Stable on routine supplementation.     Seizure disorder Valproic acid level was therapeutic on admission. No documentation of recurrent seizures. Stable.    CRI (chronic renal insufficiency), stage 3 (moderate)  Baseline creatinine appears to be 2.2-2.7. Creatinine remains stable at 2.02 from chemistries done on 09/20/16.    Fall Appears to be somewhat chronic with frequent falls noted. Evaluated by neurology with subsequent recommendations to proceed with MRI of brain and spine. Findings unremarkable. Truvada discontinued in case it was contributing to his neuropathy. Neurologist ultimately recommended outpatient follow-up for EMG/neuropsychiatric testing as well as SPEP testing. PT evaluated the patient 09/12/16 and recommended SNF placement/24 hour supervision. Social work continues to evaluate placement options, as patient ambulatory in his room.    Acute urinary retention Noted to have 1 L of retention on 09/09/16. I&O cathed PRN. Flomax started 09/12/16 with resolution of urinary retention.    Acute lower UTI Developed fever 09/16/16, thought to be from UTI given recent need for Foley catheterization for treatment of urinary retention. Empirically placed on ceftriaxone. Urine cultures positive for Escherichia coli, Rocephin sensitive. Completed therapy on 09/18/16.    HIV screening The patient falls between the ages of 13-64 and should be  screened for HIV, therefore HIV testing ordered: Nonreactive.    Chronic  thrombocytopenia Chronic. Platelet count low noted to be low dating back to 2017.    Family Communication/Anticipated D/C date and plan/Code Status   DVT prophylaxis: Lovenox ordered. Code Status: Full Code.  Family Communication: No family present at the bedside. Sister updated by telephone 09/20/16. Disposition Plan: Had been living with his sister. Plan is for SNF placement, Not a candidate for psychiatric placement.   Medical Consultants:    Psychiatry  Neurology   Procedures:    None  Anti-Infectives:    Rocephin 09/16/16---> 09/18/16   Subjective:   Awake, alert and more interactive today. No physical complaints. Bowels moving. Appetite is good.  Objective:    Vitals:   09/22/16 1430 09/22/16 2159 09/22/16 2232 09/23/16 0414  BP: 126/72 (!) 146/79  (!) 99/58  Pulse: 62 (!) 51 64 (!) 56  Resp: 20 18  17   Temp: 97.9 F (36.6 C) 97.3 F (36.3 C)  98 F (36.7 C)  TempSrc: Oral Oral  Oral  SpO2: 98% 100%  96%  Weight:      Height:        Intake/Output Summary (Last 24 hours) at 09/23/16 0755 Last data filed at 09/22/16 2045  Gross per 24 hour  Intake             2400 ml  Output                5 ml  Net             2395 ml   Filed Weights   09/10/16 1029 09/15/16 0517  Weight: 94.4 kg (208 lb 1.8 oz) 97.1 kg (214 lb 1.1 oz)    Exam: General exam: More interactive today, sitting up in bed. Respiratory system: Unchanged from 09/22/16: Clear to auscultation bilaterally. Normal respiratory effort.  Cardiovascular system: Unchanged from 09/22/16: Regular rate, and rhythm. No murmurs, rubs, or gallops. Gastrointestinal system: Unchanged from 09/22/16: Soft, nontender, nondistended with normal active bowel sounds. Central nervous system: Alert and oriented to self. Nonfocal. Extremities: Unchanged from 09/22/16: No clubbing, edema, or cyanosis. Skin: Unchanged from 09/22/16: No rashes, lesions, or ulcers. Psychiatry: Impaired insight/judgment. Affect a bit brighter  today.   Data Reviewed:   I have personally reviewed following labs and imaging studies:  Labs: Basic Metabolic Panel:  Recent Labs Lab 09/16/16 0800 09/20/16 0952  NA 134* 135  K 4.5 3.5  CL 106 102  CO2 19* 27  GLUCOSE 212* 142*  BUN 30* 34*  CREATININE 2.01* 2.02*  CALCIUM 9.1 8.9   GFR Estimated Creatinine Clearance: 54.9 mL/min (A) (by C-G formula based on SCr of 2.02 mg/dL (H)). Liver Function Tests:  Recent Labs Lab 09/20/16 0952  AST 23  ALT 19  ALKPHOS 74  BILITOT 0.5  PROT 6.6  ALBUMIN 3.2*    Recent Labs Lab 09/17/16 0726 09/20/16 0952  AMMONIA 30 22   CBC:  Recent Labs Lab 09/16/16 0800 09/17/16 0528 09/20/16 0952  WBC 13.4* 11.5* 3.9*  HGB 14.0 14.2 13.8  HCT 40.7 40.9 39.3  MCV 89.1 90.3 87.3  PLT 92* 97* 123*   CBG:  Recent Labs Lab 09/21/16 2101 09/22/16 0737 09/22/16 1146 09/22/16 1639 09/22/16 2206  GLUCAP 181* 152* 162* 176* 194*   Sepsis Labs:  Recent Labs Lab 09/16/16 0800 09/17/16 0528 09/20/16 0952  WBC 13.4* 11.5* 3.9*    Microbiology Recent Results (from the past 240 hour(s))  Culture,  blood (routine x 2)     Status: None   Collection Time: 09/15/16 10:45 PM  Result Value Ref Range Status   Specimen Description BLOOD RIGHT ARM  Final   Special Requests IN PEDIATRIC BOTTLE Blood Culture adequate volume  Final   Culture NO GROWTH 5 DAYS  Final   Report Status 09/21/2016 FINAL  Final  Culture, blood (routine x 2)     Status: None   Collection Time: 09/15/16 10:59 PM  Result Value Ref Range Status   Specimen Description BLOOD LEFT ARM  Final   Special Requests   Final    BOTTLES DRAWN AEROBIC AND ANAEROBIC Blood Culture adequate volume   Culture NO GROWTH 5 DAYS  Final   Report Status 09/21/2016 FINAL  Final  Culture, Urine     Status: Abnormal   Collection Time: 09/15/16 11:22 PM  Result Value Ref Range Status   Specimen Description URINE, CLEAN CATCH  Final   Special Requests NONE  Final    Culture >=100,000 COLONIES/mL ESCHERICHIA COLI (A)  Final   Report Status 09/18/2016 FINAL  Final   Organism ID, Bacteria ESCHERICHIA COLI (A)  Final      Susceptibility   Escherichia coli - MIC*    AMPICILLIN <=2 SENSITIVE Sensitive     CEFAZOLIN <=4 SENSITIVE Sensitive     CEFTRIAXONE <=1 SENSITIVE Sensitive     CIPROFLOXACIN <=0.25 SENSITIVE Sensitive     GENTAMICIN <=1 SENSITIVE Sensitive     IMIPENEM <=0.25 SENSITIVE Sensitive     NITROFURANTOIN <=16 SENSITIVE Sensitive     TRIMETH/SULFA <=20 SENSITIVE Sensitive     AMPICILLIN/SULBACTAM <=2 SENSITIVE Sensitive     PIP/TAZO <=4 SENSITIVE Sensitive     Extended ESBL NEGATIVE Sensitive     * >=100,000 COLONIES/mL ESCHERICHIA COLI    Radiology: No results found.  Medications:   . cephALEXin  500 mg Oral Q12H  . divalproex  1,500 mg Oral QHS  . enoxaparin (LOVENOX) injection  40 mg Subcutaneous Q24H  . folic acid  1 mg Oral Daily  . insulin aspart  0-9 Units Subcutaneous TID WC  . insulin aspart  4 Units Subcutaneous TID WC  . LORazepam  0.5 mg Oral BID  . potassium chloride  20 mEq Oral Daily  . propranolol  20 mg Oral BID  . QUEtiapine  200 mg Oral QHS  . sodium chloride flush  3 mL Intravenous Q12H  . tamsulosin  0.4 mg Oral Daily  . testosterone  5 g Transdermal Daily  . traZODone  100 mg Oral QHS   Continuous Infusions:  Medical decision making is moderately complex therefore this is a level 2 visit. (> 3 problem points, 0 data points, moderate risk: Need 2 out of 3)   Problems/DDx Points   Self limited or minor (max 2)       1   Established problem, stable (Bipolar, DM, HTN, CKD)       1  4  Established problem, worsening       2   New problem, no additional W/U planned (max 1)       3    New problem, additional W/U planned        4    Data Reviewed Points   Review/order clinical lab tests       1    Review/order x-rays       1   Review/order tests (Echo, EKG, PFTs, etc)       1  Discussion of test  results w/ performing MD       1   Independent review of image, tracing or specimen       2    Decision to obtain old records       1   Review and summation of old records       2    Level of risk Presenting prob Diagnostics Management   Minimal 1 self limited/minor Labs CXR EKG/EEG U/A U/S Rest Gargles Bandages Dressings   Low 2 or more self limited/minor 1 stable chronic Acute uncomplicated illness Tests (PFTS) Non-CV imaging Arterial labs Biopsies of skin OTC drugs Minor surgery-no risk PT OT IVF without additives    Moderate 1 or more chronic illnesses w/ mild exac, progression or S/E from tx 2 or more stable chronic illnesses Undiagnosed new problem w/ uncertain prognosis Acute complicated injury  Stress tests Endoscopies with no risk factors Deep needle or incisional bx CV imaging without risk LP Thoracentesis Paracentesis Minor surgery w/ risks Elective major surgery w/ no risk (open, percutaneous or endoscopic) Prescription drugs Therapeutic nucl med IVF with additives Closed tx of fracture/dislocation  X  High Severe exac of chronic illness Acute or chronic illness/injury may pose a threat to life or bodily function (ARF) Change in neuro status    CV imaging w/ contrast and risk Cardio electophysiologic tests Endoscopies w/ risk Discography Elective major surgery Emergency major surgery Parenteral controlled substances Drug therapy req monitoring for toxicity DNR/de-escalation of care    MDM Prob points Data points Risk   Straightforward    <1    <1    Min   Low complexity    2    2    Low   Moderate    3    3    Mod  x  High Complexity    4 or more    4 or more    High        LOS: 16 days   RAMA,CHRISTINA  [facility] Pager [phone]. If unable to reach me by pager, please call my cell phone at [phone].  *Please refer to [email or web address], password [email or web address] to get updated schedule on who will round on this patient, as hospitalists switch  teams weekly. If 7PM-7AM, please contact night-coverage at [email or web address], password [email or web address] for any overnight needs.  09/23/2016, 7:55 AM

## 2016-09-23 NOTE — Progress Notes (Addendum)
10am: Staffed case with Medical Director who states that patient is still more appropriate for SNF since psych would not keep the patient very long. CSW sending updates to SNF facilities.    9am: CSW advised by MD that patient is more physically mobile but still hallucinating. CSW will send referral to [facility] and [facility] again for review.   [name] LCSWA [phone]

## 2016-09-24 LAB — GLUCOSE, CAPILLARY
GLUCOSE-CAPILLARY: 209 mg/dL — AB (ref 65–99)
Glucose-Capillary: 176 mg/dL — ABNORMAL HIGH (ref 65–99)
Glucose-Capillary: 241 mg/dL — ABNORMAL HIGH (ref 65–99)
Glucose-Capillary: 215 mg/dL — ABNORMAL HIGH (ref 65–99)

## 2016-09-24 NOTE — Progress Notes (Signed)
Progress Note    [name]  DPO:[id number] DOB: Apr 12, 1966  DOA: 09/07/2016 PCP: Beverly Sessions    Brief Narrative:   6  PMH of PTSD,  bipolar disorder,  ADD,  stage III CKD,  history of seizures, diabetes who was admitted 09/07/16 for evaluation of AMS/fall.   He was noted to be noncompliant with his medications and to have been declining over the past year per his sister.   Initial laboratory workup unremarkable other than elevated ammonia level and CT scan of the head showed no evidence of acute intracranial abnormalities. He was noted to be restless and possibly psychotic by nursing staff on admission.  Assessment/Plan:   Principal Problem:   Early onset dementia with advanced brain atrophy for age/Acute metabolic encephalopathy Initial workup revealed an elevated ammonia level, but folate, B-12, TSH levels were all WNL.  CT of the head was unremarkable  MRI of the brain also negative for acute findings, but did show advanced brain atrophy. Continues to have intermittent agitation/confusion.  Repeat ammonia level was within normal limits at 22.   suspect he has early onset dementia given significant brain atrophy for age.   It is unlikely that he will be able to live in an unsupervised environment, and his sister is unable to provide him with 24-hour supervision.  Discussed with case manager + psychiatrist, who will reevaluate the patient to assist with disposition.   He is ambulatory in his room.  Dr Rockne Menghini spoke with Dr. Louretta Shorten regarding his consultation 09/22/16. He tells me that the patient is not a candidate for inpatient psychiatric care.  I discussed care with the patient and Dr. Lamar Benes 6/6 and discontinued psych sitter  Active Problems:   Psychiatric illnesses including Bipolar disorder ([place]), depression, anxiety and PTSD Continue scheduled Seroquel. Continue Haldol as needed. Evaluated by psychiatry and neurology.  Was noted to be agitated and paranoid  09/21/16.  Ativan discontinued per psychiatry recommendations.  Essential hypertension Stable on propranolol. Norvasc discontinued due to borderline low blood pressure at times.   Diabetes type 2, uncontrolled ([place]) Evaluated by diabetes coordinator 09/16/16. Continue insulin sensitive SSI and 4 units of meal coverage. CBGs 176-241. Not optimally controlled. Will increase meal coverage to 5 units.   Hypokalemia Stable on routine supplementation.   Seizure disorder Valproic acid level was therapeutic on admission. No documentation of recurrent seizures. Stable.  CRI (chronic renal insufficiency), stage 3 (moderate)  Baseline creatinine appears to be 2.2-2.7. Creatinine remains stable at 2.02 from chemistries done on 09/20/16. \labs am  Fall Appears to be somewhat chronic with frequent falls noted. Evaluated by neurology with subsequent recommendations to proceed with MRI of brain and spine. Findings unremarkable. Truvada discontinued in case it was contributing to his neuropathy. Neurologist ultimately recommended outpatient follow-up for EMG/neuropsychiatric testing as well as SPEP testing. PT evaluated the patient 09/12/16 and recommended SNF placement/24 hour supervision. Social work continues to evaluate placement options, as patient ambulatory in his room.  Acute urinary retention Noted to have 1 L of retention on 09/09/16. I&O cathed PRN. Flomax started 09/12/16 with resolution of urinary retention.  Acute lower UTI Developed fever 09/16/16, thought to be from UTI given recent need for Foley catheterization for treatment of urinary retention. Empirically placed on ceftriaxone. Urine cultures positive for Escherichia coli, Rocephin sensitive. Completed therapy on 09/18/16.  HIV screening The patient falls between the ages of 13-64 and should be screened for HIV, therefore HIV testing ordered: Nonreactive.    Chronic thrombocytopenia  Chronic. Platelet count low noted to be low dating back to  2017.    Family Communication/Anticipated D/C date and plan/Code Status   DVT prophylaxis: Lovenox ordered. Code Status: Full Code.  Family Communication: No family present at the bedside.  Disposition Plan: Had been living with his sister. Plan is for SNF placement,    Medical Consultants:    Psychiatry  Neurology   Procedures:    None  Anti-Infectives:    Rocephin 09/16/16---> 09/18/16   Subjective:   Awake alert unstaeady on feet No cp no sob Hearing dogs barking, 1 small 1 large eaitng ok No further people whipsering in his ears  Objective:    Vitals:   09/23/16 0825 09/23/16 1431 09/23/16 2105 09/24/16 0500  BP:  116/72 (!) 144/80 (!) 90/46  Pulse: (!) 50 66 61 (!) 57  Resp:  18 18   Temp:  97.8 F (36.6 C) 98.5 F (36.9 C) 97.7 F (36.5 C)  TempSrc:  Oral Oral Oral  SpO2:  100% 100% 100%  Weight:      Height:        Intake/Output Summary (Last 24 hours) at 09/24/16 1413 Last data filed at 09/24/16 0900  Gross per 24 hour  Intake              240 ml  Output                0 ml  Net              240 ml   Filed Weights   09/10/16 1029 09/15/16 0517  Weight: 94.4 kg (208 lb 1.8 oz) 97.1 kg (214 lb 1.1 oz)    Exam:  Tall thin male nad Slight unsteady walking s1 s 2no m/r/g abd soft nt nd no rebound no guard No le ecema Neuro is intact   Data Reviewed:   I have personally reviewed following labs and imaging studies:  Labs: Basic Metabolic Panel:  Recent Labs Lab 09/20/16 0952  NA 135  K 3.5  CL 102  CO2 27  GLUCOSE 142*  BUN 34*  CREATININE 2.02*  CALCIUM 8.9   GFR Estimated Creatinine Clearance: 54.9 mL/min (A) (by C-G formula based on SCr of 2.02 mg/dL (H)). Liver Function Tests:  Recent Labs Lab 09/20/16 0952  AST 23  ALT 19  ALKPHOS 74  BILITOT 0.5  PROT 6.6  ALBUMIN 3.2*    Recent Labs Lab 09/20/16 0952  AMMONIA 22   CBC:  Recent Labs Lab 09/20/16 0952  WBC 3.9*  HGB 13.8  HCT 39.3  MCV  87.3  PLT 123*   CBG:  Recent Labs Lab 09/23/16 1218 09/23/16 1643 09/23/16 2104 09/24/16 0859 09/24/16 1201  GLUCAP 183* 226* 186* 176* 241*   Sepsis Labs:  Recent Labs Lab 09/20/16 0952  WBC 3.9*    Microbiology Recent Results (from the past 240 hour(s))  Culture, blood (routine x 2)     Status: None   Collection Time: 09/15/16 10:45 PM  Result Value Ref Range Status   Specimen Description BLOOD RIGHT ARM  Final   Special Requests IN PEDIATRIC BOTTLE Blood Culture adequate volume  Final   Culture NO GROWTH 5 DAYS  Final   Report Status 09/21/2016 FINAL  Final  Culture, blood (routine x 2)     Status: None   Collection Time: 09/15/16 10:59 PM  Result Value Ref Range Status   Specimen Description BLOOD LEFT ARM  Final   Special Requests  Final    BOTTLES DRAWN AEROBIC AND ANAEROBIC Blood Culture adequate volume   Culture NO GROWTH 5 DAYS  Final   Report Status 09/21/2016 FINAL  Final  Culture, Urine     Status: Abnormal   Collection Time: 09/15/16 11:22 PM  Result Value Ref Range Status   Specimen Description URINE, CLEAN CATCH  Final   Special Requests NONE  Final   Culture >=100,000 COLONIES/mL ESCHERICHIA COLI (A)  Final   Report Status 09/18/2016 FINAL  Final   Organism ID, Bacteria ESCHERICHIA COLI (A)  Final      Susceptibility   Escherichia coli - MIC*    AMPICILLIN <=2 SENSITIVE Sensitive     CEFAZOLIN <=4 SENSITIVE Sensitive     CEFTRIAXONE <=1 SENSITIVE Sensitive     CIPROFLOXACIN <=0.25 SENSITIVE Sensitive     GENTAMICIN <=1 SENSITIVE Sensitive     IMIPENEM <=0.25 SENSITIVE Sensitive     NITROFURANTOIN <=16 SENSITIVE Sensitive     TRIMETH/SULFA <=20 SENSITIVE Sensitive     AMPICILLIN/SULBACTAM <=2 SENSITIVE Sensitive     PIP/TAZO <=4 SENSITIVE Sensitive     Extended ESBL NEGATIVE Sensitive     * >=100,000 COLONIES/mL ESCHERICHIA COLI    Radiology: No results found.  Medications:   . cephALEXin  500 mg Oral Q12H  . divalproex  1,500 mg  Oral QHS  . enoxaparin (LOVENOX) injection  40 mg Subcutaneous Q24H  . folic acid  1 mg Oral Daily  . insulin aspart  0-9 Units Subcutaneous TID WC  . insulin aspart  5 Units Subcutaneous TID WC  . potassium chloride  20 mEq Oral Daily  . propranolol  20 mg Oral BID  . QUEtiapine  200 mg Oral QHS  . sodium chloride flush  3 mL Intravenous Q12H  . tamsulosin  0.4 mg Oral Daily  . testosterone  5 g Transdermal Daily  . traZODone  100 mg Oral QHS   Continuous Infusions:  Verneita Griffes, MD [facility] ([phone]    LOS: 17 days    09/24/2016, 2:13 PM

## 2016-09-24 NOTE — Progress Notes (Signed)
Patient has been denied by [facility]. CSW continuing search for SNF facility.  Percell Locus Tora Prunty LCSWA [phone]

## 2016-09-24 NOTE — Progress Notes (Signed)
Physical Therapy Treatment Patient Details Name: [name] MRN: [id number] DOB: 1965/07/28 Today's Date: 09/24/2016    History of Present Illness 51 y.o.malewith history significant of PTSD, bipolar disorder, ADD, depression, anxiety, GERD, CKD stage III, seizure d/o, HTN, HLD, and DM who presentedwith altered mental status. The patient's sister reported he had been cognitively declining for the past year w/ multiple falls at home. Non - compliant w/ his medications per family, more confused and restless, reporting auditory and visual hallucinations. CT head in the ED was w/o acute findings.     PT Comments    Pt seen for mobility progression. He continues to present with instability with ambulation. Pt remains at a high risk for falls. PT continuing to recommend pt d/c to SNF. Pt would continue to benefit from skilled physical therapy services at this time while admitted and after d/c to address the below listed limitations in order to improve overall safety and independence with functional mobility.   Follow Up Recommendations  SNF;Supervision/Assistance - 24 hour     Equipment Recommendations  Other (comment) (defer to next venue)    Recommendations for Other Services       Precautions / Restrictions Precautions Precautions: Fall Restrictions Weight Bearing Restrictions: No    Mobility  Bed Mobility Overal bed mobility: Independent                Transfers Overall transfer level: Modified independent Equipment used: None                Ambulation/Gait Ambulation/Gait assistance: Supervision;Min guard Ambulation Distance (Feet): 40 Feet (40' x4 with standing rest breaks) Assistive device: None (reaching for handrails in hallway frequently) Gait Pattern/deviations: Step-through pattern;Decreased step length - right;Decreased step length - left;Decreased stride length;Drifts right/left Gait velocity: decreased Gait velocity interpretation: Below normal  speed for age/gender General Gait Details: pt with modest instability with ambulation and frequently reaching for handrails in hallway.    Stairs            Wheelchair Mobility    Modified Rankin (Stroke Patients Only)       Balance Overall balance assessment: Needs assistance Sitting-balance support: No upper extremity supported;Feet supported Sitting balance-Leahy Scale: Good     Standing balance support: No upper extremity supported Standing balance-Leahy Scale: Fair Standing balance comment: static standing balance without UE support was fair with moderate sway, dynamic was poor                            Cognition Arousal/Alertness: Awake/alert Behavior During Therapy: WFL for tasks assessed/performed Overall Cognitive Status: Within Functional Limits for tasks assessed                                        Exercises      General Comments        Pertinent Vitals/Pain Pain Assessment: No/denies pain    Home Living                      Prior Function            PT Goals (current goals can now be found in the care plan section) Acute Rehab PT Goals PT Goal Formulation: Patient unable to participate in goal setting Time For Goal Achievement: 09/26/16 Potential to Achieve Goals: Fair Progress towards PT goals: Progressing toward goals  Frequency    Min 3X/week      PT Plan Current plan remains appropriate    Co-evaluation              AM-PAC PT "6 Clicks" Daily Activity  Outcome Measure  Difficulty turning over in bed (including adjusting bedclothes, sheets and blankets)?: None Difficulty moving from lying on back to sitting on the side of the bed? : None Difficulty sitting down on and standing up from a chair with arms (e.g., wheelchair, bedside commode, etc,.)?: A Little Help needed moving to and from a bed to chair (including a wheelchair)?: A Little Help needed walking in hospital room?: A  Little Help needed climbing 3-5 steps with a railing? : A Lot 6 Click Score: 19    End of Session   Activity Tolerance: Patient tolerated treatment well Patient left: in bed;with nursing/sitter in room (sitting EOB) Nurse Communication: Mobility status PT Visit Diagnosis: Repeated falls (R29.6);Other abnormalities of gait and mobility (R26.89)     Time: 1342-1401 PT Time Calculation (min) (ACUTE ONLY): 19 min  Charges:  $Gait Training: 8-22 mins                    G Codes:       [place], [place], [place] 09/24/2016, 2:06 PM

## 2016-09-24 NOTE — Care Management Note (Signed)
Case Management Note  Patient Details  Name: [name] MRN: [id number] Date of Birth: [birth year]  Subjective/Objective:      Admitted 09/07/16 for evaluation of AMS/fall ,  Hx of PTSD, bipolar disorder, ADD, depression, anxiety, stage III CKD, history of seizures, and diabetes.   [name] (Sister)     [phone]       Action/Plan: Plan is to d/c to SNF today. CSW managing disposition to facilty.  Expected Discharge Date:    09/24/2016            Expected Discharge Plan:  [place]  In-House Referral:  Clinical Social Work  Discharge planning Services  CM Consult  Post Acute Care Choice:    Choice offered to:        Status of Service:  Completed, signed off  If discussed at Long Length of Stay Meetings, dates discussed:    Additional Comments:  Sharin Mons, RN 09/24/2016, 10:58 AM

## 2016-09-25 LAB — COMPREHENSIVE METABOLIC PANEL
ALBUMIN: 3 g/dL — AB (ref 3.5–5.0)
ALT: 10 U/L — ABNORMAL LOW (ref 17–63)
ANION GAP: 9 (ref 5–15)
AST: 14 U/L — ABNORMAL LOW (ref 15–41)
Alkaline Phosphatase: 71 U/L (ref 38–126)
BILIRUBIN TOTAL: 0.6 mg/dL (ref 0.3–1.2)
BUN: 18 mg/dL (ref 6–20)
CO2: 25 mmol/L (ref 22–32)
Calcium: 8.7 mg/dL — ABNORMAL LOW (ref 8.9–10.3)
Chloride: 103 mmol/L (ref 101–111)
Creatinine, Ser: 1.58 mg/dL — ABNORMAL HIGH (ref 0.61–1.24)
GFR calc Af Amer: 58 mL/min — ABNORMAL LOW (ref >60)
GFR calc non Af Amer: 50 mL/min — ABNORMAL LOW (ref >60)
GLUCOSE: 158 mg/dL — AB (ref 65–99)
POTASSIUM: 4 mmol/L (ref 3.5–5.1)
SODIUM: 137 mmol/L (ref 135–145)
TOTAL PROTEIN: 6.4 g/dL — AB (ref 6.5–8.1)

## 2016-09-25 LAB — CBC WITH DIFFERENTIAL/PLATELET
BASOS ABS: 0 10*3/uL (ref 0.0–0.1)
Basophils Relative: 0 %
Eosinophils Absolute: 0.1 10*3/uL (ref 0.0–0.7)
Eosinophils Relative: 1 %
HEMATOCRIT: 38.1 % — AB (ref 39.0–52.0)
HEMOGLOBIN: 13 g/dL (ref 13.0–17.0)
LYMPHS PCT: 44 %
Lymphs Abs: 2.4 10*3/uL (ref 0.7–4.0)
MCH: 29.9 pg (ref 26.0–34.0)
MCHC: 34.1 g/dL (ref 30.0–36.0)
MCV: 87.6 fL (ref 78.0–100.0)
MONO ABS: 0.6 10*3/uL (ref 0.1–1.0)
Monocytes Relative: 11 %
NEUTROS ABS: 2.4 10*3/uL (ref 1.7–7.7)
NEUTROS PCT: 44 %
Platelets: 177 10*3/uL (ref 150–400)
RBC: 4.35 MIL/uL (ref 4.22–5.81)
RDW: 12.6 % (ref 11.5–15.5)
WBC: 5.5 10*3/uL (ref 4.0–10.5)

## 2016-09-25 LAB — GLUCOSE, CAPILLARY
GLUCOSE-CAPILLARY: 280 mg/dL — AB (ref 65–99)
GLUCOSE-CAPILLARY: 153 mg/dL — AB (ref 65–99)
Glucose-Capillary: 178 mg/dL — ABNORMAL HIGH (ref 65–99)
Glucose-Capillary: 263 mg/dL — ABNORMAL HIGH (ref 65–99)

## 2016-09-25 NOTE — Progress Notes (Addendum)
[facility] has no beds. [facility] is unable to accept patient.   CSW staffed case with Market researcher. He suggested [facility] or [facility] in [place] for either SNF or ALF level. CSW sent referrals.   Percell Locus Michaelene Dutan LCSWA [phone]

## 2016-09-25 NOTE — Progress Notes (Signed)
Physical Therapy Treatment Patient Details Name: [name] MRN: [id number] DOB: May 08, 1965 Today's Date: 09/25/2016    History of Present Illness 51 y.o.malewith history significant of PTSD, bipolar disorder, ADD, depression, anxiety, GERD, CKD stage III, seizure d/o, HTN, HLD, and DM who presentedwith altered mental status. The patient's sister reported he had been cognitively declining for the past year w/ multiple falls at home. Non - compliant w/ his medications per family, more confused and restless, reporting auditory and visual hallucinations. CT head in the ED was w/o acute findings.     PT Comments    Pt seen for mobility progression. He continues to demonstrate instability with ambulation without an AD, frequently reaching for external supports. Pt remains at a high risk for falls. Pt would continue to benefit from skilled physical therapy services at this time while admitted and after d/c to address the below listed limitations in order to improve overall safety and independence with functional mobility.   Follow Up Recommendations  SNF;Supervision/Assistance - 24 hour     Equipment Recommendations  None recommended by PT    Recommendations for Other Services       Precautions / Restrictions Precautions Precautions: Fall Restrictions Weight Bearing Restrictions: No    Mobility  Bed Mobility Overal bed mobility: Independent                Transfers Overall transfer level: Independent                  Ambulation/Gait Ambulation/Gait assistance: Supervision Ambulation Distance (Feet): 100 Feet (100' x2 with standing rest break) Assistive device: None (frequently reaching for hand rails in hallway) Gait Pattern/deviations: Step-through pattern;Decreased step length - right;Decreased step length - left;Decreased stride length;Drifts right/left Gait velocity: decreased Gait velocity interpretation: Below normal speed for age/gender General Gait  Details: pt with modest instability with ambulation and frequently reaching for handrails in hallway.    Stairs            Wheelchair Mobility    Modified Rankin (Stroke Patients Only)       Balance Overall balance assessment: Needs assistance Sitting-balance support: No upper extremity supported;Feet supported Sitting balance-Leahy Scale: Good     Standing balance support: No upper extremity supported Standing balance-Leahy Scale: Fair Standing balance comment: static standing balance without UE support was fair with moderate sway, dynamic was poor                            Cognition Arousal/Alertness: Awake/alert Behavior During Therapy: WFL for tasks assessed/performed Overall Cognitive Status: Within Functional Limits for tasks assessed                                        Exercises      General Comments        Pertinent Vitals/Pain Pain Assessment: No/denies pain    Home Living                      Prior Function            PT Goals (current goals can now be found in the care plan section) Acute Rehab PT Goals PT Goal Formulation: Patient unable to participate in goal setting Time For Goal Achievement: 09/26/16 Potential to Achieve Goals: Fair Progress towards PT goals: Progressing toward goals    Frequency  Min 3X/week      PT Plan Current plan remains appropriate    Co-evaluation              AM-PAC PT "6 Clicks" Daily Activity  Outcome Measure  Difficulty turning over in bed (including adjusting bedclothes, sheets and blankets)?: None Difficulty moving from lying on back to sitting on the side of the bed? : None Difficulty sitting down on and standing up from a chair with arms (e.g., wheelchair, bedside commode, etc,.)?: None Help needed moving to and from a bed to chair (including a wheelchair)?: None Help needed walking in hospital room?: A Little Help needed climbing 3-5 steps with  a railing? : A Little 6 Click Score: 22    End of Session   Activity Tolerance: Patient tolerated treatment well Patient left: in bed Nurse Communication: Mobility status PT Visit Diagnosis: Repeated falls (R29.6);Other abnormalities of gait and mobility (R26.89)     Time: 6440-3474 PT Time Calculation (min) (ACUTE ONLY): 26 min  Charges:  $Gait Training: 8-22 mins $Therapeutic Activity: 8-22 mins                    G Codes:       [place], [place], [place] 09/25/2016, 11:27 AM

## 2016-09-25 NOTE — Progress Notes (Signed)
Results for ZAKARI, BATHE (MRN [id number]) as of 09/25/2016 12:43  Ref. Range 09/24/2016 12:01 09/24/2016 17:35 09/24/2016 21:01 09/25/2016 08:01 09/25/2016 11:49  Glucose-Capillary Latest Ref Range: 65 - 99 mg/dL 241 (H) 215 (H) 209 (H) 178 (H) 280 (H)  Noted that postprandial blood sugars have been greater than 180 mg/dl. Recommend increasing Novolog to MODERATE correction scale TID. Continue Novolog 5 units TID with meals if eating at least 50% of meals. Will continue to monitor blood sugars while in the hospital.  Harvel Ricks RN BSN CDE Diabetes Coordinator Pager: [phone]  8am-5pm

## 2016-09-25 NOTE — Progress Notes (Signed)
Progress Note    [name]  FGH:[id number] DOB: [birth year]  DOA: 09/07/2016 PCP: Beverly Sessions    Brief Narrative:   54  PMH of PTSD,  bipolar disorder,  ADD,  stage III CKD,  history of seizures, Currently applying for disability secondary to chronic low back pain diabetes who was admitted 09/07/16 for evaluation of AMS/fall.   He was noted to be noncompliant with his medications and to have been declining over the past year per his sister.   Initial laboratory workup unremarkable other than elevated ammonia level and CT scan of the head showed no evidence of acute intracranial abnormalities. He was noted to be restless and possibly psychotic by nursing staff on admission.  Assessment/Plan:   Principal Problem:   Early onset dementia with advanced brain atrophy for age/Acute metabolic encephalopathy Initial workup revealed an elevated ammonia level, but folate, B-12, TSH levels were all WNL.  CT of the head was unremarkable  MRI of the brain also negative for acute findings, but did show advanced brain atrophy. Intermittent agitation is resolved Repeat ammonia level was within normal limits at 22.  suspect he has early onset dementia given significant brain atrophy for age.   sister is unable to provide him with 24-hour supervision.  Discussed with case manager + psychiatrist Dr Rockne Menghini spoke with Dr. Louretta Shorten regarding his consultation 09/22/16-not a candidate for inpatient psychiatric care.  I discussed care with the patient and Dr. Lamar Benes 6/6 and discontinued psych sitter    Psychiatric illnesses including Bipolar disorder ([place]), depression, anxiety and PTSD Continue scheduled Seroquel. Continue Haldol as needed. Evaluated by psychiatry and neurology.  Was noted to be agitated and paranoid 09/21/16.  Ativan discontinued per psychiatry recommendations.  Essential hypertension Stable on propranolol. Norvasc discontinued due to borderline low blood pressure at times.     Diabetes type 2, uncontrolled ([place]) Evaluated by diabetes coordinator 09/16/16. Continue insulin sensitive SSI and 4 units of meal coverage. CBGs 176-241. Not optimally controlled. Will increase meal coverage to 5 units.  Monitor trends and in a.m. Will change to moderate resistance scale if patient continues to have sugars above 250  Hypokalemia Stable on routine supplementation.   Seizure disorder Valproic acid level was therapeutic on admission. No documentation of recurrent seizures. Stable.  CRI (chronic renal insufficiency), stage 3 (moderate)  Baseline creatinine appears to be 2.2-2.7. Creatinine remains stable at 2.02 from chemistries done on 09/20/16. Repeat labs 6/7 showed creatinine 1.5 LFTs normal  Fall Appears to be somewhat chronic with frequent falls noted. Evaluated by neurology with subsequent recommendations to proceed with MRI of brain and spine. Findings unremarkable. Truvada discontinued in case it was contributing to his neuropathy. Neurologist ultimately recommended outpatient follow-up for EMG/neuropsychiatric testing as well as SPEP testing. PT evaluated the patient 09/12/16 and recommended SNF placement/24 hour supervision. Social work continues to evaluate placement options, as patient ambulatory in his room.  Acute urinary retention Noted to have 1 L of retention on 09/09/16. I&O cathed PRN. Flomax started 09/12/16 with resolution of urinary retention.  Acute lower UTI Developed fever 09/16/16, thought to be from UTI given recent need for Foley catheterization for treatment of urinary retention. Empirically placed on ceftriaxone. Urine cultures positive for Escherichia coli, Rocephin sensitive. Completed therapy on 09/18/16.  HIV screening The patient falls between the ages of 13-64 and should be screened for HIV, therefore HIV testing ordered: Nonreactive.    Chronic thrombocytopenia Chronic. Platelet count low noted to be low dating back to  2017.    Family  Communication/Anticipated D/C date and plan/Code Status   DVT prophylaxis: Lovenox ordered. Code Status: Full Code.  Family Communication: No family present at the bedside.  Disposition Plan: Had been living with his sister. Plan is for SNF placement,    Medical Consultants:    Psychiatry  Neurology   Procedures:    None  Anti-Infectives:    Rocephin 09/16/16---> 09/18/16   Subjective:   Pleasant alert Increased verbal output Seems intermittently distracted and cannot focus  Objective:    Vitals:   09/24/16 2103 09/25/16 0549 09/25/16 1055 09/25/16 1359  BP: 138/76 108/61 (!) 112/50 120/62  Pulse: 64 61 60 (!) 56  Resp: 18 18  18   Temp: 98.7 F (37.1 C) 98.4 F (36.9 C)  98.2 F (36.8 C)  TempSrc: Oral Oral  Oral  SpO2: 97% 99%  99%  Weight:      Height:        Intake/Output Summary (Last 24 hours) at 09/25/16 1736 Last data filed at 09/25/16 1402  Gross per 24 hour  Intake             1200 ml  Output                0 ml  Net             1200 ml   Filed Weights   09/10/16 1029 09/15/16 0517  Weight: 94.4 kg (208 lb 1.8 oz) 97.1 kg (214 lb 1.1 oz)    Exam:  Tall pleasant garrulous S1-S2 no murmur rub or gallop CTA B Abdomen soft nontender Neuro intact   Data Reviewed:   I have personally reviewed following labs and imaging studies:  Labs: Basic Metabolic Panel:  Recent Labs Lab 09/20/16 0952 09/25/16 0520  NA 135 137  K 3.5 4.0  CL 102 103  CO2 27 25  GLUCOSE 142* 158*  BUN 34* 18  CREATININE 2.02* 1.58*  CALCIUM 8.9 8.7*   GFR Estimated Creatinine Clearance: 70.2 mL/min (A) (by C-G formula based on SCr of 1.58 mg/dL (H)). Liver Function Tests:  Recent Labs Lab 09/20/16 0952 09/25/16 0520  AST 23 14*  ALT 19 10*  ALKPHOS 74 71  BILITOT 0.5 0.6  PROT 6.6 6.4*  ALBUMIN 3.2* 3.0*    Recent Labs Lab 09/20/16 0952  AMMONIA 22   CBC:  Recent Labs Lab 09/20/16 0952 09/25/16 0520  WBC 3.9* 5.5  NEUTROABS  --   2.4  HGB 13.8 13.0  HCT 39.3 38.1*  MCV 87.3 87.6  PLT 123* 177   CBG:  Recent Labs Lab 09/24/16 1735 09/24/16 2101 09/25/16 0801 09/25/16 1149 09/25/16 1707  GLUCAP 215* 209* 178* 280* 153*   Sepsis Labs:  Recent Labs Lab 09/20/16 0952 09/25/16 0520  WBC 3.9* 5.5    Microbiology Recent Results (from the past 240 hour(s))  Culture, blood (routine x 2)     Status: None   Collection Time: 09/15/16 10:45 PM  Result Value Ref Range Status   Specimen Description BLOOD RIGHT ARM  Final   Special Requests IN PEDIATRIC BOTTLE Blood Culture adequate volume  Final   Culture NO GROWTH 5 DAYS  Final   Report Status 09/21/2016 FINAL  Final  Culture, blood (routine x 2)     Status: None   Collection Time: 09/15/16 10:59 PM  Result Value Ref Range Status   Specimen Description BLOOD LEFT ARM  Final   Special Requests   Final  BOTTLES DRAWN AEROBIC AND ANAEROBIC Blood Culture adequate volume   Culture NO GROWTH 5 DAYS  Final   Report Status 09/21/2016 FINAL  Final  Culture, Urine     Status: Abnormal   Collection Time: 09/15/16 11:22 PM  Result Value Ref Range Status   Specimen Description URINE, CLEAN CATCH  Final   Special Requests NONE  Final   Culture >=100,000 COLONIES/mL ESCHERICHIA COLI (A)  Final   Report Status 09/18/2016 FINAL  Final   Organism ID, Bacteria ESCHERICHIA COLI (A)  Final      Susceptibility   Escherichia coli - MIC*    AMPICILLIN <=2 SENSITIVE Sensitive     CEFAZOLIN <=4 SENSITIVE Sensitive     CEFTRIAXONE <=1 SENSITIVE Sensitive     CIPROFLOXACIN <=0.25 SENSITIVE Sensitive     GENTAMICIN <=1 SENSITIVE Sensitive     IMIPENEM <=0.25 SENSITIVE Sensitive     NITROFURANTOIN <=16 SENSITIVE Sensitive     TRIMETH/SULFA <=20 SENSITIVE Sensitive     AMPICILLIN/SULBACTAM <=2 SENSITIVE Sensitive     PIP/TAZO <=4 SENSITIVE Sensitive     Extended ESBL NEGATIVE Sensitive     * >=100,000 COLONIES/mL ESCHERICHIA COLI    Radiology: No results  found.  Medications:   . divalproex  1,500 mg Oral QHS  . enoxaparin (LOVENOX) injection  40 mg Subcutaneous Q24H  . folic acid  1 mg Oral Daily  . insulin aspart  0-9 Units Subcutaneous TID WC  . insulin aspart  5 Units Subcutaneous TID WC  . potassium chloride  20 mEq Oral Daily  . propranolol  20 mg Oral BID  . QUEtiapine  200 mg Oral QHS  . sodium chloride flush  3 mL Intravenous Q12H  . tamsulosin  0.4 mg Oral Daily  . testosterone  5 g Transdermal Daily  . traZODone  100 mg Oral QHS   Continuous Infusions:  Verneita Griffes, MD [facility] (P) [phone]  15 minutes   LOS: 18 days    09/25/2016, 5:36 PM

## 2016-09-26 LAB — GLUCOSE, CAPILLARY
GLUCOSE-CAPILLARY: 173 mg/dL — AB (ref 65–99)
GLUCOSE-CAPILLARY: 228 mg/dL — AB (ref 65–99)
Glucose-Capillary: 175 mg/dL — ABNORMAL HIGH (ref 65–99)
Glucose-Capillary: 244 mg/dL — ABNORMAL HIGH (ref 65–99)

## 2016-09-26 NOTE — Progress Notes (Signed)
Ambulating in hall States unsteady advised patient use walker Will see in am Await SNF

## 2016-09-26 NOTE — Progress Notes (Signed)
[place] has denied patient.   [name] LCSWA [phone]

## 2016-09-27 LAB — GLUCOSE, CAPILLARY
GLUCOSE-CAPILLARY: 172 mg/dL — AB (ref 65–99)
GLUCOSE-CAPILLARY: 254 mg/dL — AB (ref 65–99)
Glucose-Capillary: 242 mg/dL — ABNORMAL HIGH (ref 65–99)
Glucose-Capillary: 232 mg/dL — ABNORMAL HIGH (ref 65–99)

## 2016-09-27 MED ORDER — GABAPENTIN 600 MG PO TABS
300.0000 mg | ORAL_TABLET | Freq: Every day | ORAL | Status: DC
  Administered 2016-09-27 – 2016-10-12 (×16): 300 mg via ORAL
  Filled 2016-09-27 (×16): qty 1

## 2016-09-27 NOTE — Clinical Social Work Note (Signed)
CSW met with pt to discuss placement, at this time there are no accepting facilities. CSW will reach out to pt's sister today to discuss sister bringing pt home.  CSW will continue to follow.  Daeshaun Specht B. Joline Maxcy Clinical Social Work Dept Weekend Social Worker [phone] 12:26 PM

## 2016-09-27 NOTE — Progress Notes (Signed)
Progress Note    [name]  ERD:[id number] DOB: December 19, 1965  DOA: 09/07/2016 PCP: Beverly Sessions    Brief Narrative:   82  PMH of PTSD,  bipolar disorder,  ADD,  stage III CKD,  history of seizures, Currently applying for disability secondary to chronic low back pain-?Periph neuropathy diabetes who was admitted 09/07/16 for evaluation of AMS/fall.   He was noted to be noncompliant with his medications and to have been declining over the past year per his sister.   Initial laboratory workup unremarkable other than elevated ammonia level and CT scan of the head showed no evidence of acute intracranial abnormalities. He was noted to be restless and possibly psychotic by nursing staff on admission.  Assessment/Plan:   Principal Problem:   Early onset dementia with advanced brain atrophy for age/Acute metabolic encephalopathy Initial workup revealed an elevated ammonia level, but folate, B-12, TSH levels were all WNL.  CT of the head was unremarkable  MRI of the brain also negative for acute findings, but did show advanced brain atrophy. Intermittent agitation is resolved Repeat ammonia level was within normal limits at 22.  ?early onset dementia given significant brain atrophy for age.   sister is unable to provide him with 24-hour supervision.  Discussed with case manager + psychiatrist Dr Rockne Menghini spoke with Dr. Louretta Shorten regarding his consultation 09/22/16-not a candidate for inpatient psychiatric care.  I discussed care with the patient and Dr. Lamar Benes 6/6 and discontinued psych sitter    Psychiatric illnesses including Bipolar disorder ([place]), depression, anxiety and PTSD Continue scheduled Seroquel. Continue Haldol as needed. Evaluated by psychiatry and neurology.  Was noted to be agitated and paranoid 09/21/16.  Ativan discontinued per psychiatry recommendations. Patient has intermittent somatization of his numerous c/o  Essential hypertension low blood pressure at times.   D/c'd 6/9 propranolol. Norvasc discontinued   Diabetes type 2, uncontrolled ([place]) Evaluated by diabetes coordinator 09/16/16. Continue insulin sensitive SSI and 4 units of meal coverage. CBGs 176-241. Not optimally controlled. Will increase meal coverage to 5 units.  Monitor trends and in a.m.  moderate resistance scale if patient continues to have sugars above 250  Hypokalemia Stable on routine supplementation.   Seizure disorder Valproic acid level was therapeutic on admission. No documentation of recurrent seizures. Stable.  CRI (chronic renal insufficiency), stage 3 (moderate)  Baseline creatinine appears to be 2.2-2.7. Creatinine remains stable at 2.02 from chemistries done on 09/20/16. Repeat labs 6/7 showed creatinine 1.5 LFTs normal  Fall Appears to be somewhat chronic with frequent falls noted. Evaluated by neurology with subsequent recommendations to proceed with MRI of brain and spine. Findings unremarkable. Truvada discontinued in case it was contributing to his neuropathy. Neurologist ultimately recommended outpatient follow-up for EMG/neuropsychiatric testing as well as SPEP testing. PT evaluated the patient 09/12/16 and recommended SNF placement/24 hour supervision. Social work continues to evaluate placement options, as patient ambulatory in his room.  Acute urinary retention Noted to have 1 L of retention on 09/09/16. I&O cathed PRN. Flomax started 09/12/16 with resolution of urinary retention. resolved  Acute lower UTI Developed fever 09/16/16, thought to be from UTI given recent need for Foley catheterization for treatment of urinary retention.  Empirically placed on ceftriaxone.  Urine cultures positive for Escherichia coli, Rocephin sensitive.  Completed therapy on 09/18/16.  HIV screening The patient falls between the ages of 13-64 and should be screened for HIV, therefore HIV testing ordered: Nonreactive.    Chronic thrombocytopenia Chronic. Platelet count low  noted  to be low dating back to 2017.    Family Communication/Anticipated D/C date and plan/Code Status   DVT prophylaxis: Lovenox ordered. Code Status: Full Code.  Family Communication: No family present at the bedside.  Disposition Plan: Had been living with his sister. Plan is for SNF placement,    Medical Consultants:    Psychiatry  Neurology   Procedures:    None  Anti-Infectives:    Rocephin 09/16/16---> 09/18/16   Subjective:   Garrulous No new issue Multiple unrelated topics and tangential pleasant  Objective:    Vitals:   09/26/16 2103 09/27/16 0630 09/27/16 0846 09/27/16 1412  BP: 118/70 103/60 97/66 115/65  Pulse: (!) 57 (!) 56 66 64  Resp: 18   17  Temp: 98.4 F (36.9 C) 97.4 F (36.3 C)  98.4 F (36.9 C)  TempSrc:  Oral  Oral  SpO2: 94% 97%  99%  Weight:      Height:       No intake or output data in the 24 hours ending 09/27/16 1514 Filed Weights   09/10/16 1029 09/15/16 0517  Weight: 94.4 kg (208 lb 1.8 oz) 97.1 kg (214 lb 1.1 oz)    Exam:  Tall pleasant garrulous S1-S2 no murmur rub or gallop CTA B Abdomen soft nontender Neuro intact No discomfort in LE's   Data Reviewed:   I have personally reviewed following labs and imaging studies:  Labs: Basic Metabolic Panel:  Recent Labs Lab 09/25/16 0520  NA 137  K 4.0  CL 103  CO2 25  GLUCOSE 158*  BUN 18  CREATININE 1.58*  CALCIUM 8.7*   GFR Estimated Creatinine Clearance: 70.2 mL/min (A) (by C-G formula based on SCr of 1.58 mg/dL (H)). Liver Function Tests:  Recent Labs Lab 09/25/16 0520  AST 14*  ALT 10*  ALKPHOS 71  BILITOT 0.6  PROT 6.4*  ALBUMIN 3.0*   No results for input(s): AMMONIA in the last 168 hours. CBC:  Recent Labs Lab 09/25/16 0520  WBC 5.5  NEUTROABS 2.4  HGB 13.0  HCT 38.1*  MCV 87.6  PLT 177   CBG:  Recent Labs Lab 09/26/16 1211 09/26/16 1727 09/26/16 2101 09/27/16 0814 09/27/16 1209  GLUCAP 228* 175* 244* 172* 242*    Sepsis Labs:  Recent Labs Lab 09/25/16 0520  WBC 5.5    Microbiology No results found for this or any previous visit (from the past 240 hour(s)).  Radiology: No results found.  Medications:   . divalproex  1,500 mg Oral QHS  . enoxaparin (LOVENOX) injection  40 mg Subcutaneous Q24H  . folic acid  1 mg Oral Daily  . insulin aspart  0-9 Units Subcutaneous TID WC  . insulin aspart  5 Units Subcutaneous TID WC  . potassium chloride  20 mEq Oral Daily  . propranolol  20 mg Oral BID  . QUEtiapine  200 mg Oral QHS  . sodium chloride flush  3 mL Intravenous Q12H  . tamsulosin  0.4 mg Oral Daily  . testosterone  5 g Transdermal Daily  . traZODone  100 mg Oral QHS   Continuous Infusions:  Verneita Griffes, MD [facility] (P) [phone]  15 minutes   LOS: 20 days    09/27/2016, 3:14 PM

## 2016-09-28 LAB — GLUCOSE, CAPILLARY
GLUCOSE-CAPILLARY: 343 mg/dL — AB (ref 65–99)
GLUCOSE-CAPILLARY: 254 mg/dL — AB (ref 65–99)
Glucose-Capillary: 164 mg/dL — ABNORMAL HIGH (ref 65–99)
Glucose-Capillary: 244 mg/dL — ABNORMAL HIGH (ref 65–99)

## 2016-09-28 MED ORDER — INSULIN GLARGINE 100 UNIT/ML ~~LOC~~ SOLN
10.0000 [IU] | Freq: Every day | SUBCUTANEOUS | Status: DC
  Administered 2016-09-28 – 2016-10-08 (×11): 10 [IU] via SUBCUTANEOUS
  Filled 2016-09-28 (×11): qty 0.1

## 2016-09-28 NOTE — Progress Notes (Signed)
PROGRESS NOTE   [name]  IWP:[id number]    DOB: 01/24/1966    DOA: 09/07/2016  PCP: Beverly Sessions   I have briefly reviewed patients previous medical records in [facility].  Brief Narrative:  51 year old male with PMH of PTSD, bipolar disorder, ADD, depression, anxiety, stage III chronic kidney disease, seizures and DM 2 who was admitted 09/07/16 for evaluation of altered mental status and fall. He was noted to be noncompliant with his medications and to have been declining over the past year per report from his sister. Initial lab work unremarkable other than elevated ammonia level and CT scan of the head showed no evidence of acute intracranial abnormalities. He was noted to be restless and possibly psychotic on admission.   Assessment & Plan:   Principal Problem:   Dementia, early onset with advanced brain atrophy for age Active Problems:   Bipolar disorder ([place])   Anxiety state   Depression   Essential hypertension   Diabetes type 2, uncontrolled ([place])   Hypokalemia   Acute metabolic encephalopathy   HLD (hyperlipidemia)   PTSD (post-traumatic stress disorder)   Acute encephalopathy   CRI (chronic renal insufficiency), stage 3 (moderate)   Fall   Acute urinary retention   Acute lower UTI   History of seizures   1. Early onset dementia with advanced brain atrophy for age/acute metabolic encephalopathy: Initial workup revealed an elevated ammonia level but folate, B12 and TSH levels were normal. CT head was unremarkable for acute findings. MRI brain also negative for acute findings but did show advanced brain atrophy. Repeat ammonia level normal at 22. He continued to have intermittent agitation/confusion. It is suspected that he has early onset dementia given significant brain atrophy for age and felt unlikely that he will be able to live in an unsupervised environment and his sister is unable to provide him with 24-hour supervision. Psychiatry has seen him multiple times  during this admission. As per their last follow-up on 09/22/16, patient stable on current medication regimen without acute symptoms of depression, anxiety, mania or psychosis, denied suicidal or homicidal ideations, intent or plans but continues to be somewhat confused, forgetful which seems chronic in nature. Psychiatry recommended continuing Seroquel 200 MG HS for mood swings, trazodone 100 MG HS for insomnia, Depakote 1500 MG HS for mood swings/bipolar disorder, Haldol 5 MG every 6 hours when necessary for agitation and combative behaviors, Ativan was discontinued, periodically monitor EKG for QTC prolongation and valproic acid levels. Per psychiatry, he did not meet criteria for acute psychiatric admission. 2. Psychiatric illness including bipolar disorder, depression, anxiety & PTSD: Continue management as indicated in problem #1 as per psychiatry recommendations. Currently stable without agitation. 3. Essential hypertension: Controlled on no medications currently. It appears that amlodipine and propranolol were discontinued due to borderline low blood pressures. Monitor closely. 4. DM type II, uncontrolled: Poorly controlled with CBGs ranging between 164-343. Add Lantus 10 units at bedtime and monitor closely. 5. Hypokalemia: Resolved. 6. Seizure disorder: Valproic acid level was therapeutic on admission. No reports of recurrent seizures. 7. Stage III chronic kidney disease: Baseline creatinine appears to be in the 2.2-2.7 range. Creatinine has improved to 1.5. Periodically follow BMP. 8. Falls: History of frequent falls. Evaluated by neurology who recommended MRI brain and spine and findings of that were unremarkable for cause. Truvada discontinued in case it was contributing to his neuropathy. Neurology recommended outpatient follow-up for EMG/neuropsychiatric testing as well as SPEP testing. PT evaluated 09/12/16 and recommended SNF/24 hour supervision.  Difficult placement and clinical social worker  following. 9. Acute urinary retention: Noted on 09/09/16. Flomax started 5/25 and urinary retention resolved. 10. Escherichia coli Acute lower UTI: Felt to be CAUTI related to recent Foley catheterization for UTI. Completed course of ceftriaxone. Treated. 11. HIV screening: Nonreactive 12. Chronic thrombocytopenia: Low platelet count noted dating back to 2017. Seems to have currently resolved.   DVT prophylaxis: Lovenox Code Status: Full Family Communication: None at bedside Disposition: Awaiting placement to SNF.   Consultants:  Psychiatry  Neurology  Procedures:  Foley catheter-discontinued  Antimicrobials:  Completed course of ceftriaxone 3 days    Subjective: Seen this morning. States that his neuropathy is controlled but reports some discomfort on the ball of his toes. Has been using his flip-flops to walk. No homicidal or suicidal ideations reported. No audiovisual hallucinations.  ROS: Denies dizziness, lightheadedness or pain elsewhere.  Objective:  Vitals:   09/27/16 1412 09/27/16 2112 09/28/16 0441 09/28/16 1424  BP: 115/65 123/68 124/60 103/62  Pulse: 64 68 (!) 52 75  Resp: 17 18 18 20   Temp: 98.4 F (36.9 C) 98.5 F (36.9 C) 97.9 F (36.6 C) 98.1 F (36.7 C)  TempSrc: Oral   Oral  SpO2: 99% 98% 99% 99%  Weight:      Height:        Examination:  General exam: Pleasant young male lying comfortably supine in bed. Respiratory system: Clear to auscultation. Respiratory effort normal. Cardiovascular system: S1 & S2 heard, RRR. No JVD, murmurs, rubs, gallops or clicks. No pedal edema. Not on telemetry. Gastrointestinal system: Abdomen is nondistended, soft and nontender. No organomegaly or masses felt. Normal bowel sounds heard. Central nervous system: Alert and oriented. No focal neurological deficits. Extremities: Symmetric 5 x 5 power. Skin: No rashes, lesions or ulcers Psychiatry: Judgement and insight appear normal. Mood & affect appropriate.      Data Reviewed: I have personally reviewed following labs and imaging studies  CBC:  Recent Labs Lab 09/25/16 0520  WBC 5.5  NEUTROABS 2.4  HGB 13.0  HCT 38.1*  MCV 87.6  PLT 497   Basic Metabolic Panel:  Recent Labs Lab 09/25/16 0520  NA 137  K 4.0  CL 103  CO2 25  GLUCOSE 158*  BUN 18  CREATININE 1.58*  CALCIUM 8.7*   Liver Function Tests:  Recent Labs Lab 09/25/16 0520  AST 14*  ALT 10*  ALKPHOS 71  BILITOT 0.6  PROT 6.4*  ALBUMIN 3.0*   CBG:  Recent Labs Lab 09/27/16 1706 09/27/16 2107 09/28/16 0740 09/28/16 1210 09/28/16 1632  GLUCAP 254* 232* 164* 343* 244*         Radiology Studies: No results found.      Scheduled Meds: . divalproex  1,500 mg Oral QHS  . enoxaparin (LOVENOX) injection  40 mg Subcutaneous Q24H  . folic acid  1 mg Oral Daily  . gabapentin  300 mg Oral QHS  . insulin aspart  0-9 Units Subcutaneous TID WC  . insulin aspart  5 Units Subcutaneous TID WC  . potassium chloride  20 mEq Oral Daily  . QUEtiapine  200 mg Oral QHS  . sodium chloride flush  3 mL Intravenous Q12H  . tamsulosin  0.4 mg Oral Daily  . testosterone  5 g Transdermal Daily  . traZODone  100 mg Oral QHS   Continuous Infusions:   LOS: 21 days     Apoorva Bugay, MD, FACP, FHM. [facility] Pager [phone]  If 7PM-7AM, please contact night-coverage  [email or web address] Password [email or web address] 09/28/2016, 5:01 PM

## 2016-09-29 LAB — GLUCOSE, CAPILLARY
GLUCOSE-CAPILLARY: 177 mg/dL — AB (ref 65–99)
Glucose-Capillary: 232 mg/dL — ABNORMAL HIGH (ref 65–99)
Glucose-Capillary: 140 mg/dL — ABNORMAL HIGH (ref 65–99)
Glucose-Capillary: 239 mg/dL — ABNORMAL HIGH (ref 65–99)

## 2016-09-29 LAB — COMPREHENSIVE METABOLIC PANEL
ALK PHOS: 65 U/L (ref 38–126)
ALT: 8 U/L — AB (ref 17–63)
AST: 12 U/L — ABNORMAL LOW (ref 15–41)
Albumin: 2.9 g/dL — ABNORMAL LOW (ref 3.5–5.0)
Anion gap: 6 (ref 5–15)
BILIRUBIN TOTAL: 0.4 mg/dL (ref 0.3–1.2)
BUN: 17 mg/dL (ref 6–20)
CO2: 26 mmol/L (ref 22–32)
CREATININE: 1.84 mg/dL — AB (ref 0.61–1.24)
Calcium: 8.7 mg/dL — ABNORMAL LOW (ref 8.9–10.3)
Chloride: 105 mmol/L (ref 101–111)
GFR, EST AFRICAN AMERICAN: 48 mL/min — AB (ref >60)
GFR, EST NON AFRICAN AMERICAN: 42 mL/min — AB (ref >60)
Glucose, Bld: 190 mg/dL — ABNORMAL HIGH (ref 65–99)
Potassium: 4.2 mmol/L (ref 3.5–5.1)
Sodium: 137 mmol/L (ref 135–145)
Total Protein: 6 g/dL — ABNORMAL LOW (ref 6.5–8.1)

## 2016-09-29 LAB — VALPROIC ACID LEVEL: VALPROIC ACID LVL: 33 ug/mL — AB (ref 50.0–100.0)

## 2016-09-29 MED ORDER — INSULIN ASPART 100 UNIT/ML ~~LOC~~ SOLN
8.0000 [IU] | Freq: Three times a day (TID) | SUBCUTANEOUS | Status: DC
  Administered 2016-09-29 – 2016-10-09 (×29): 8 [IU] via SUBCUTANEOUS

## 2016-09-29 MED ORDER — INSULIN ASPART 100 UNIT/ML ~~LOC~~ SOLN
0.0000 [IU] | Freq: Three times a day (TID) | SUBCUTANEOUS | Status: DC
  Administered 2016-09-29: 2 [IU] via SUBCUTANEOUS
  Administered 2016-09-30: 5 [IU] via SUBCUTANEOUS
  Administered 2016-09-30 (×2): 2 [IU] via SUBCUTANEOUS
  Administered 2016-10-01: 8 [IU] via SUBCUTANEOUS
  Administered 2016-10-01 – 2016-10-02 (×4): 3 [IU] via SUBCUTANEOUS
  Administered 2016-10-03: 5 [IU] via SUBCUTANEOUS
  Administered 2016-10-03: 3 [IU] via SUBCUTANEOUS
  Administered 2016-10-03: 2 [IU] via SUBCUTANEOUS
  Administered 2016-10-04 (×3): 3 [IU] via SUBCUTANEOUS
  Administered 2016-10-05 (×2): 2 [IU] via SUBCUTANEOUS
  Administered 2016-10-05: 5 [IU] via SUBCUTANEOUS
  Administered 2016-10-06 – 2016-10-07 (×3): 3 [IU] via SUBCUTANEOUS
  Administered 2016-10-08 (×3): 2 [IU] via SUBCUTANEOUS
  Administered 2016-10-09: 5 [IU] via SUBCUTANEOUS
  Administered 2016-10-09: 3 [IU] via SUBCUTANEOUS
  Administered 2016-10-09: 2 [IU] via SUBCUTANEOUS
  Administered 2016-10-10: 3 [IU] via SUBCUTANEOUS
  Administered 2016-10-10: 2 [IU] via SUBCUTANEOUS
  Administered 2016-10-10 – 2016-10-11 (×3): 3 [IU] via SUBCUTANEOUS
  Administered 2016-10-11: 2 [IU] via SUBCUTANEOUS
  Administered 2016-10-12 (×3): 3 [IU] via SUBCUTANEOUS
  Administered 2016-10-13: 8 [IU] via SUBCUTANEOUS
  Administered 2016-10-13: 3 [IU] via SUBCUTANEOUS

## 2016-09-29 MED ORDER — INSULIN ASPART 100 UNIT/ML ~~LOC~~ SOLN
0.0000 [IU] | Freq: Every day | SUBCUTANEOUS | Status: DC
  Administered 2016-09-29 – 2016-10-10 (×5): 2 [IU] via SUBCUTANEOUS
  Administered 2016-10-11: 4 [IU] via SUBCUTANEOUS
  Administered 2016-10-12: 2 [IU] via SUBCUTANEOUS

## 2016-09-29 NOTE — Progress Notes (Signed)
Results for GOODWIN, KAMPHAUS (MRN [id number]) as of 09/29/2016 12:52  Ref. Range 09/28/2016 12:10 09/28/2016 16:32 09/28/2016 21:33 09/29/2016 08:00 09/29/2016 12:18  Glucose-Capillary Latest Ref Range: 65 - 99 mg/dL 343 (H) 244 (H) 254 (H) 177 (H) 232 (H)  Noted that blood sugars continue to be greater than 180 mg/dl. Recommend increasing Novolog correction scale to MODERATE TID & HS and increasing Novolog meal coverage to 8 units TID if blood sugars continue to be elevated.  Patient takes Novolog 10 units TID as meal coverage at home. Will continue to monitor blood sugars while in the hospital.  Harvel Ricks RN BSN CDE Diabetes Coordinator Pager: [phone]  8am-5pm

## 2016-09-29 NOTE — Progress Notes (Signed)
Occupational Therapy Treatment Patient Details Name: [name] MRN: [id number] DOB: February 04, 1966 Today's Date: 09/29/2016    History of present illness 51 y.o.malewith history significant of PTSD, bipolar disorder, ADD, depression, anxiety, GERD, CKD stage III, seizure d/o, HTN, HLD, and DM who presentedwith altered mental status. The patient's sister reported he had been cognitively declining for the past year w/ multiple falls at home. Non - compliant w/ his medications per family, more confused and restless, reporting auditory and visual hallucinations. CT head in the ED was w/o acute findings.    OT comments  Pt walking about his room and in hall with his walker without assist. Pt reports showering daily without physical assist. Pt educated in benefits of using 3 in 1 as shower seat for energy conservation. Pt with cognitive impairment and will need ongoing assist for medications and IADL. Will see later this week and plan to d/c if he continues to maintain level of independence.   Follow Up Recommendations  SNF;Supervision/Assistance - 24 hour    Equipment Recommendations       Recommendations for Other Services      Precautions / Restrictions Restrictions Weight Bearing Restrictions: No       Mobility Bed Mobility Overal bed mobility: Independent                Transfers Overall transfer level: Independent Equipment used: Rolling walker (2 wheeled)                  Balance     Sitting balance-Leahy Scale: Good       Standing balance-Leahy Scale: Fair                             ADL either performed or assessed with clinical judgement   ADL                       Lower Body Dressing: Modified independent;Sit to/from stand   Toilet Transfer: Modified Independent;RW;Ambulation   Toileting- Water quality scientist and Hygiene: Modified independent   Tub/ Banker: Modified independent;Ambulation;Rolling walker    Functional mobility during ADLs: Modified independent;Rolling walker General ADL Comments: Pt able to lean over to reach feet without difficulty. Reports performing showering without physical assist on a regular basis. Pt has been walking in room/bathroom with walker independently.     Vision       Perception     Praxis      Cognition Arousal/Alertness: Awake/alert Behavior During Therapy: WFL for tasks assessed/performed Overall Cognitive Status: Impaired/Different from baseline Area of Impairment: Memory                     Memory: Decreased short-term memory         General Comments: Less difficulty communicate complex thoughts. Reports getting mad over medication changes and not being informed.         Exercises     Shoulder Instructions       General Comments      Pertinent Vitals/ Pain       Pain Assessment: Faces Faces Pain Scale: Hurts little more Pain Location: feet Pain Descriptors / Indicators: Guarding;Grimacing Pain Intervention(s): Monitored during session;Repositioned  Home Living  Prior Functioning/Environment              Frequency  Min 2X/week        Progress Toward Goals  OT Goals(current goals can now be found in the care plan section)  Progress towards OT goals: Progressing toward goals  Acute Rehab OT Goals Patient Stated Goal: to get disability so he can receive medicines Time For Goal Achievement: 09/30/16 Potential to Achieve Goals: Good  Plan Discharge plan remains appropriate    Co-evaluation                 AM-PAC PT "6 Clicks" Daily Activity     Outcome Measure   Help from another person eating meals?: None Help from another person taking care of personal grooming?: None Help from another person toileting, which includes using toliet, bedpan, or urinal?: None Help from another person bathing (including washing, rinsing, drying)?: A  Little Help from another person to put on and taking off regular upper body clothing?: None Help from another person to put on and taking off regular lower body clothing?: None 6 Click Score: 23    End of Session Equipment Utilized During Treatment: Rolling walker  OT Visit Diagnosis: Unsteadiness on feet (R26.81);Other symptoms and signs involving cognitive function   Activity Tolerance Patient tolerated treatment well   Patient Left in bed;with call bell/phone within reach   Nurse Communication          Time: 1660-6301 OT Time Calculation (min): 29 min  Charges: OT General Charges $OT Visit: 1 Procedure OT Treatments $Self Care/Home Management : 23-37 mins    Malka So 09/29/2016, 12:28 PM  [phone]

## 2016-09-29 NOTE — Progress Notes (Signed)
PT Cancellation Note  Patient Details Name: [name] MRN: [id number] DOB: 11/09/1965   Cancelled Treatment:    Reason Eval/Treat Not Completed: (P) Other (comment) (Pt in shower on arrival will continue efforts.  )   Brayden Brodhead Eli Hose 09/29/2016, 4:26 PM  Governor [place]ooks, PTA pager [phone]

## 2016-09-29 NOTE — Progress Notes (Signed)
Pt is a high fall risk unsteady gait, hx of seizure has yellow bracelet on, use walker to walk, the need for him to be on bed alarm has been explained to pt but still refused bed alarm, charge nurse Lexine Baton f has been informed about pt refusal of bed larm

## 2016-09-29 NOTE — Progress Notes (Signed)
PT Cancellation Note  Patient Details Name: [name] MRN: [id number] DOB: 1966/03/31   Cancelled Treatment:    Reason Eval/Treat Not Completed: Other (comment) (Pt refused PT due to his diet and meds were changed.)Will check back tomorrow.  Thanks.   [name] 09/29/2016, 9:27 AM  [facility] [phone] (pager)

## 2016-09-29 NOTE — Progress Notes (Signed)
PROGRESS NOTE   [name]  GYF:[id number]    DOB: 1965-11-14    DOA: 09/07/2016  PCP: Beverly Sessions   I have briefly reviewed patients previous medical records in [facility].  Brief Narrative:  51 year old male with PMH of PTSD, bipolar disorder, ADD, depression, anxiety, stage III chronic kidney disease, seizures and DM 2 who was admitted 09/07/16 for evaluation of altered mental status and fall. He was noted to be noncompliant with his medications and to have been declining over the past year per report from his sister. Initial lab work unremarkable other than elevated ammonia level and CT scan of the head showed no evidence of acute intracranial abnormalities. He was noted to be restless and possibly psychotic on admission.   Assessment & Plan:   Principal Problem:   Dementia, early onset with advanced brain atrophy for age Active Problems:   Bipolar disorder ([place])   Anxiety state   Depression   Essential hypertension   Diabetes type 2, uncontrolled ([place])   Hypokalemia   Acute metabolic encephalopathy   HLD (hyperlipidemia)   PTSD (post-traumatic stress disorder)   Acute encephalopathy   CRI (chronic renal insufficiency), stage 3 (moderate)   Fall   Acute urinary retention   Acute lower UTI   History of seizures   1. Early onset dementia with advanced brain atrophy for age/acute metabolic encephalopathy: Initial workup revealed an elevated ammonia level but folate, B12 and TSH levels were normal. CT head was unremarkable for acute findings. MRI brain also negative for acute findings but did show advanced brain atrophy. Repeat ammonia level normal at 22. He continued to have intermittent agitation/confusion. It is suspected that he has early onset dementia given significant brain atrophy for age and felt unlikely that he will be able to live in an unsupervised environment and his sister is unable to provide him with 24-hour supervision. Psychiatry has seen him multiple times  during this admission. As per their last follow-up on 09/22/16, patient stable on current medication regimen without acute symptoms of depression, anxiety, mania or psychosis, denied suicidal or homicidal ideations, intent or plans but continues to be somewhat confused, forgetful which seems chronic in nature. Psychiatry recommended continuing Seroquel 200 MG HS for mood swings, trazodone 100 MG HS for insomnia, Depakote 1500 MG HS for mood swings/bipolar disorder, Haldol 5 MG every 6 hours when necessary for agitation and combative behaviors, Ativan was discontinued, periodically monitor EKG for QTC prolongation (QTC 432 ms by EKG on 6/11) and valproic acid levels (33 on 09/29/16). Per psychiatry, he did not meet criteria for acute psychiatric admission. Stable. 2. Psychiatric illness including bipolar disorder, depression, anxiety & PTSD: Continue management as indicated in problem #1 as per psychiatry recommendations. Currently stable without agitation. 3. Essential hypertension: Controlled on no medications currently. Amlodipine and propranolol were discontinued due to borderline low blood pressures. Stable. 4. DM type II, uncontrolled: Poorly controlled. A1c 9.4 on 06/02/16. Noncompliant with most aspects of diabetes care at home. Added Lantus 10 units at bedtime on 6/10. Patient refuses diabetic diet and insists on regular diet-changed on 6/11. Change sliding scale to moderate sensitivity and increase mealtime NovoLog to 8 units. 5. Hypokalemia: Resolved. 6. Seizure disorder: Valproic acid level was therapeutic on admission. No reports of recurrent seizures. 7. Stage III chronic kidney disease: Baseline creatinine appears to be in the 2.2-2.7 range. Creatinine had improved to 1.5 but has increased to 1.84 on 6/11. Euvolemic clinically. Follow BMP periodically. 8. Falls: History of frequent falls.  Evaluated by neurology who recommended MRI brain and spine and findings of that were unremarkable for cause.  Truvada discontinued in case it was contributing to his neuropathy. Neurology recommended outpatient follow-up for EMG/neuropsychiatric testing as well as SPEP testing. PT evaluated 09/12/16 and recommended SNF/24 hour supervision. Difficult placement and clinical social worker following. 9. Acute urinary retention: Noted on 09/09/16. Flomax started 5/25 and urinary retention resolved. 10. Escherichia coli Acute lower UTI: Felt to be CAUTI related to recent Foley catheterization for UTI. Completed course of ceftriaxone. Treated. 11. HIV screening: Nonreactive 12. Chronic thrombocytopenia: Low platelet count noted dating back to 2017. Seems to have currently resolved.   DVT prophylaxis: Lovenox Code Status: Full Family Communication: None at bedside Disposition: Awaiting placement to SNF. Discussed with clinical social worker and case management and no bed offers at this time.   Consultants:  Psychiatry  Neurology  Procedures:  Foley catheter-discontinued  Antimicrobials:  Completed course of ceftriaxone 3 days    Subjective: Seen this morning. Patient is upset that he was placed on heart healthy/diabetic diet which he does not like and insists on changing this to regular diet. He states that he is trying to control his diabetes by changing from regular soft drinks to diet drinks but does not want to change diet otherwise. No pain reported. As per RN, no acute issues.  ROS: Denies dizziness, lightheadedness or pain elsewhere.  Objective:  Vitals:   09/28/16 1424 09/28/16 2135 09/29/16 0431 09/29/16 1446  BP: 103/62 116/77 107/66 100/63  Pulse: 75 74 (!) 54 66  Resp: 20 18 18 17   Temp: 98.1 F (36.7 C) 98.3 F (36.8 C) 97.6 F (36.4 C) 97.6 F (36.4 C)  TempSrc: Oral Oral Oral Oral  SpO2: 99% 97% 99% 100%  Weight:      Height:        Examination:  General exam: Pleasant young male lying comfortably supine in bed. Respiratory system: Clear to auscultation. Respiratory  effort normal.Stable without change Cardiovascular system: S1 & S2 heard, RRR. No JVD, murmurs, rubs, gallops or clicks. No pedal edema. Not on telemetry. Stable Gastrointestinal system: Abdomen is nondistended, soft and nontender. No organomegaly or masses felt. Normal bowel sounds heard. Stable Central nervous system: Alert and oriented. No focal neurological deficits. Stable Extremities: Symmetric 5 x 5 power. Skin: No rashes, lesions or ulcers. No acute findings noted on feet exam on 6/10. Psychiatry: Judgement and insight appear normal. Mood & affect flat.     Data Reviewed: I have personally reviewed following labs and imaging studies  CBC:  Recent Labs Lab 09/25/16 0520  WBC 5.5  NEUTROABS 2.4  HGB 13.0  HCT 38.1*  MCV 87.6  PLT 371   Basic Metabolic Panel:  Recent Labs Lab 09/25/16 0520 09/29/16 0356  NA 137 137  K 4.0 4.2  CL 103 105  CO2 25 26  GLUCOSE 158* 190*  BUN 18 17  CREATININE 1.58* 1.84*  CALCIUM 8.7* 8.7*   Liver Function Tests:  Recent Labs Lab 09/25/16 0520 09/29/16 0356  AST 14* 12*  ALT 10* 8*  ALKPHOS 71 65  BILITOT 0.6 0.4  PROT 6.4* 6.0*  ALBUMIN 3.0* 2.9*   CBG:  Recent Labs Lab 09/28/16 1210 09/28/16 1632 09/28/16 2133 09/29/16 0800 09/29/16 1218  GLUCAP 343* 244* 254* 177* 232*         Radiology Studies: No results found.      Scheduled Meds: . divalproex  1,500 mg Oral QHS  . enoxaparin (LOVENOX)  injection  40 mg Subcutaneous Q24H  . folic acid  1 mg Oral Daily  . gabapentin  300 mg Oral QHS  . insulin aspart  0-9 Units Subcutaneous TID WC  . insulin aspart  5 Units Subcutaneous TID WC  . insulin glargine  10 Units Subcutaneous QHS  . potassium chloride  20 mEq Oral Daily  . QUEtiapine  200 mg Oral QHS  . sodium chloride flush  3 mL Intravenous Q12H  . tamsulosin  0.4 mg Oral Daily  . testosterone  5 g Transdermal Daily  . traZODone  100 mg Oral QHS   Continuous Infusions:   LOS: 22 days      Jonathan Capell, MD, FACP, FHM. [facility] Pager [phone]  If 7PM-7AM, please contact night-coverage [email or web address] Password [facility] 09/29/2016, 2:48 PM

## 2016-09-30 LAB — GLUCOSE, CAPILLARY
GLUCOSE-CAPILLARY: 134 mg/dL — AB (ref 65–99)
Glucose-Capillary: 141 mg/dL — ABNORMAL HIGH (ref 65–99)
Glucose-Capillary: 232 mg/dL — ABNORMAL HIGH (ref 65–99)
Glucose-Capillary: 136 mg/dL — ABNORMAL HIGH (ref 65–99)

## 2016-09-30 LAB — BASIC METABOLIC PANEL
ANION GAP: 8 (ref 5–15)
BUN: 15 mg/dL (ref 6–20)
CALCIUM: 8.6 mg/dL — AB (ref 8.9–10.3)
CO2: 28 mmol/L (ref 22–32)
CREATININE: 1.71 mg/dL — AB (ref 0.61–1.24)
Chloride: 102 mmol/L (ref 101–111)
GFR calc Af Amer: 53 mL/min — ABNORMAL LOW (ref >60)
GFR calc non Af Amer: 46 mL/min — ABNORMAL LOW (ref >60)
GLUCOSE: 143 mg/dL — AB (ref 65–99)
Potassium: 4.1 mmol/L (ref 3.5–5.1)
Sodium: 138 mmol/L (ref 135–145)

## 2016-09-30 LAB — HEMOGLOBIN A1C
HEMOGLOBIN A1C: 6.8 % — AB (ref 4.8–5.6)
MEAN PLASMA GLUCOSE: 148 mg/dL

## 2016-09-30 NOTE — Progress Notes (Addendum)
Patient bed alarm sounded, I responded and patient was upset that the alarm was on.  He stated he has been independent and refuse for me to reset the alarm.  Explain to patient our goal is to keep him safe and to assist him as needed to prevent falling or injury. He continued to refuse the alarm. Notified the Nurse and director was present during this conversation.

## 2016-09-30 NOTE — Progress Notes (Signed)
[place] denied patient. CSW spoke to patient's sister about bringing him home, but she will be out of town all next week. CSW to speak with Medical Director for other options.  [name] LCSWA [phone]

## 2016-09-30 NOTE — Progress Notes (Addendum)
Education regarding falls and bed alarms reinforced, patient unwilling to allow bed alarms to be used.  Second patient education folder given to patient with additional print outs regarding falls.

## 2016-09-30 NOTE — Progress Notes (Signed)
PROGRESS NOTE   [name]  QBV:[id number]    DOB: Nov 21, 1965    DOA: 09/07/2016  PCP: Beverly Sessions   I have briefly reviewed patients previous medical records in [facility].  Brief Narrative:  51 year old male with PMH of PTSD, bipolar disorder, ADD, depression, anxiety, stage III chronic kidney disease, seizures and DM 2 who was admitted 09/07/16 for evaluation of altered mental status and fall. He was noted to be noncompliant with his medications and to have been declining over the past year per report from his sister. Initial lab work unremarkable other than elevated ammonia level and CT scan of the head showed no evidence of acute intracranial abnormalities. He was noted to be restless and possibly psychotic on admission.   Assessment & Plan:   Principal Problem:   Dementia, early onset with advanced brain atrophy for age Active Problems:   Bipolar disorder ([place])   Anxiety state   Depression   Essential hypertension   Diabetes type 2, uncontrolled ([place])   Hypokalemia   Acute metabolic encephalopathy   HLD (hyperlipidemia)   PTSD (post-traumatic stress disorder)   Acute encephalopathy   CRI (chronic renal insufficiency), stage 3 (moderate)   Fall   Acute urinary retention   Acute lower UTI   History of seizures   1. Early onset dementia with advanced brain atrophy for age/acute metabolic encephalopathy: Initial workup revealed an elevated ammonia level but folate, B12 and TSH levels were normal. CT head was unremarkable for acute findings. MRI brain also negative for acute findings but did show advanced brain atrophy. Repeat ammonia level normal at 22. He continued to have intermittent agitation/confusion. It is suspected that he has early onset dementia given significant brain atrophy for age and felt unlikely that he will be able to live in an unsupervised environment and his sister is unable to provide him with 24-hour supervision. Psychiatry has seen him multiple times  during this admission. As per their last follow-up on 09/22/16, patient stable on current medication regimen without acute symptoms of depression, anxiety, mania or psychosis, denied suicidal or homicidal ideations, intent or plans but continues to be somewhat confused, forgetful which seems chronic in nature. Psychiatry recommended continuing Seroquel 200 MG HS for mood swings, trazodone 100 MG HS for insomnia, Depakote 1500 MG HS for mood swings/bipolar disorder, Haldol 5 MG every 6 hours when necessary for agitation and combative behaviors, Ativan was discontinued, periodically monitor EKG for QTC prolongation (QTC 432 ms by EKG on 6/11) and valproic acid levels (33 on 09/29/16). Per psychiatry, he did not meet criteria for acute psychiatric admission. Stable. 2. Psychiatric illness including bipolar disorder, depression, anxiety & PTSD: Continue management as indicated in problem #1 as per psychiatry recommendations. Currently stable without agitation. As per nursing documentation, high fall risk from unsteady gait but refuses to follow appropriate precautions in the hospital. 3. Essential hypertension: Controlled on no medications currently. Amlodipine and propranolol were discontinued due to borderline low blood pressures. Stable. 4. DM type II, uncontrolled: Poorly controlled. A1c 9.4 on 06/02/16. Noncompliant with most aspects of diabetes care at home. Added Lantus 10 units at bedtime on 6/10. Patient refuses diabetic diet and insists on regular diet-changed on 6/11. Change sliding scale to moderate sensitivity and increase mealtime NovoLog to 8 units. CBG is better but still fluctuating. 5. Hypokalemia: Resolved. 6. Seizure disorder: Valproic acid level was therapeutic on admission. No reports of recurrent seizures. 7. Stage III chronic kidney disease: Baseline creatinine appears to be in the  2.2-2.7 range. Creatinine had improved to 1.5 but has increased to 1.84 on 6/11. Euvolemic clinically. Creatinine  stable. 8. Falls: History of frequent falls. Evaluated by neurology who recommended MRI brain and spine and findings of that were unremarkable for cause. Truvada discontinued in case it was contributing to his neuropathy. Neurology recommended outpatient follow-up for EMG/neuropsychiatric testing as well as SPEP testing. PT evaluated 09/12/16 and recommended SNF/24 hour supervision. Difficult placement and clinical social worker following. Remains a fall risk. Discussed with Education officer, museum. 9. Acute urinary retention: Noted on 09/09/16. Flomax started 5/25 and urinary retention resolved. 10. Escherichia coli Acute lower UTI: Felt to be CAUTI related to recent Foley catheterization for UTI. Completed course of ceftriaxone. Treated. 11. HIV screening: Nonreactive 12. Chronic thrombocytopenia: Low platelet count noted dating back to 2017. Seems to have currently resolved.   DVT prophylaxis: Lovenox Code Status: Full Family Communication: None at bedside Disposition: Awaiting placement to SNF. Discussed with clinical social worker and case management and apparently there is a SNF that is looking at patient's case.   Consultants:  Psychiatry  Neurology  Procedures:  Foley catheter-discontinued  Antimicrobials:  Completed course of ceftriaxone 3 days    Subjective: Patient denies complaints. He is maintaining his CBG checks and states that he's been very careful in what he eats and is also doing a carbohydrate count. Satisfied that his diet was changed yesterday to regular diet and states that he will do his best to control diabetes.  ROS: Denies dizziness, lightheadedness or pain elsewhere.  Objective:  Vitals:   09/29/16 1446 09/29/16 2209 09/30/16 0537 09/30/16 1400  BP: 100/63 125/70 96/62 107/67  Pulse: 66 60 (!) 55 71  Resp: 17  18 13   Temp: 97.6 F (36.4 C) 98.8 F (37.1 C) 97.5 F (36.4 C)   TempSrc: Oral Oral Oral   SpO2: 100% 100% 100% 99%  Weight:      Height:         Examination:  General exam: Pleasant young male lying comfortably supine in bed. Respiratory system: Clear to auscultation. Respiratory effort normal.Stable without change. Cardiovascular system: S1 & S2 heard, RRR. No JVD, murmurs, rubs, gallops or clicks. No pedal edema. Stable without change. Gastrointestinal system: Abdomen is nondistended, soft and nontender. No organomegaly or masses felt. Normal bowel sounds heard. No change. Central nervous system: Alert and oriented. No focal neurological deficits. No change. Extremities: Symmetric 5 x 5 power. Skin: No rashes, lesions or ulcers. No acute findings noted on feet exam on 6/10. Psychiatry: Judgement and insight appear normal. Mood & affect flat.     Data Reviewed: I have personally reviewed following labs and imaging studies  CBC:  Recent Labs Lab 09/25/16 0520  WBC 5.5  NEUTROABS 2.4  HGB 13.0  HCT 38.1*  MCV 87.6  PLT 737   Basic Metabolic Panel:  Recent Labs Lab 09/25/16 0520 09/29/16 0356 09/30/16 0352  NA 137 137 138  K 4.0 4.2 4.1  CL 103 105 102  CO2 25 26 28   GLUCOSE 158* 190* 143*  BUN 18 17 15   CREATININE 1.58* 1.84* 1.71*  CALCIUM 8.7* 8.7* 8.6*   Liver Function Tests:  Recent Labs Lab 09/25/16 0520 09/29/16 0356  AST 14* 12*  ALT 10* 8*  ALKPHOS 71 65  BILITOT 0.6 0.4  PROT 6.4* 6.0*  ALBUMIN 3.0* 2.9*   CBG:  Recent Labs Lab 09/29/16 1218 09/29/16 1709 09/29/16 2206 09/30/16 0746 09/30/16 1227  GLUCAP 232* 140* 239* 141* 232*  Radiology Studies: No results found.      Scheduled Meds: . divalproex  1,500 mg Oral QHS  . enoxaparin (LOVENOX) injection  40 mg Subcutaneous Q24H  . folic acid  1 mg Oral Daily  . gabapentin  300 mg Oral QHS  . insulin aspart  0-15 Units Subcutaneous TID WC  . insulin aspart  0-5 Units Subcutaneous QHS  . insulin aspart  8 Units Subcutaneous TID WC  . insulin glargine  10 Units Subcutaneous QHS  . potassium chloride  20  mEq Oral Daily  . QUEtiapine  200 mg Oral QHS  . sodium chloride flush  3 mL Intravenous Q12H  . tamsulosin  0.4 mg Oral Daily  . testosterone  5 g Transdermal Daily  . traZODone  100 mg Oral QHS   Continuous Infusions:   LOS: 23 days     Mariangela Heldt, MD, FACP, FHM. [facility] Pager [phone]  If 7PM-7AM, please contact night-coverage [email or web address] Password [facility] 09/30/2016, 3:44 PM

## 2016-09-30 NOTE — Progress Notes (Signed)
Physical Therapy Treatment Patient Details Name: [name] MRN: [id number] DOB: 02-19-1966 Today's Date: 09/30/2016    History of Present Illness 51 y.o.malewith history significant of PTSD, bipolar disorder, ADD, depression, anxiety, GERD, CKD stage III, seizure d/o, HTN, HLD, and DM who presentedwith altered mental status. The patient's sister reported he had been cognitively declining for the past year w/ multiple falls at home. Non - compliant w/ his medications per family, more confused and restless, reporting auditory and visual hallucinations. CT head in the ED was w/o acute findings.     PT Comments    Pt performed gait and stair training he remains to fatigue quickly with instability noted with use of cane.  Pt will require assistance to negotiate stairs in his home.  He remains a high risk for repeated falls.  Pt given HEP for continued use to improve strength.  Pt awaiting placement at this time.   Follow Up Recommendations  SNF;Supervision/Assistance - 24 hour     Equipment Recommendations  None recommended by PT    Recommendations for Other Services       Precautions / Restrictions Precautions Precautions: Fall Restrictions Weight Bearing Restrictions: No    Mobility  Bed Mobility Overal bed mobility: Needs Assistance Bed Mobility: Supine to Sit;Sit to Supine       Sit to supine: Modified independent (Device/Increase time)   General bed mobility comments: No assistance needed.    Transfers Overall transfer level: Modified independent Equipment used: Straight cane Transfers: Sit to/from Stand Sit to Stand: Modified independent (Device/Increase time)         General transfer comment: Slow and guarded to ascend but no assistance or cueing needed.    Ambulation/Gait Ambulation/Gait assistance: Min guard Ambulation Distance (Feet): 150 Feet (with SPC) Assistive device: Straight cane Gait Pattern/deviations: Step-through pattern;Trunk  flexed;Decreased stride length;Narrow base of support Gait velocity: decreased Gait velocity interpretation: Below normal speed for age/gender General Gait Details: Cues for upper trunk control, sequencing with cane and increasing stride length.  Pt with mild instability with use of cane.  pt will require RW at d/c until balance improves.     Stairs Stairs: Yes   Stair Management: One rail Left;Forwards;With cane Number of Stairs: 18 General stair comments: Cues for sequencing and hand placement on rail, cues for sequencing with cane.  Pt fatigues and requires cues for foot clearance.  Min guard for safety.    Wheelchair Mobility    Modified Rankin (Stroke Patients Only)       Balance Overall balance assessment: Needs assistance Sitting-balance support: No upper extremity supported;Feet supported         Standing balance-Leahy Scale: Fair                              Cognition Arousal/Alertness: Awake/alert Behavior During Therapy: WFL for tasks assessed/performed Overall Cognitive Status: Within Functional Limits for tasks assessed                                        Exercises General Exercises - Lower Extremity Hip ABduction/ADduction: AROM;Both;10 reps;Standing Hip Flexion/Marching: AROM;Both;10 reps;Standing Heel Raises: AROM;Both;10 reps;Standing Mini-Sqauts: AROM;Both;10 reps    General Comments        Pertinent Vitals/Pain Pain Assessment: 0-10 Pain Score: 5  Pain Location: R hand/wrist Pain Descriptors / Indicators: Guarding;Grimacing Pain Intervention(s): Monitored during session;Repositioned  Home Living                      Prior Function            PT Goals (current goals can now be found in the care plan section) Acute Rehab PT Goals Patient Stated Goal: To get up and move more. Potential to Achieve Goals: Fair Progress towards PT goals: Progressing toward goals    Frequency    Min  3X/week      PT Plan Current plan remains appropriate    Co-evaluation              AM-PAC PT "6 Clicks" Daily Activity  Outcome Measure  Difficulty turning over in bed (including adjusting bedclothes, sheets and blankets)?: None Difficulty moving from lying on back to sitting on the side of the bed? : None Difficulty sitting down on and standing up from a chair with arms (e.g., wheelchair, bedside commode, etc,.)?: None Help needed moving to and from a bed to chair (including a wheelchair)?: None Help needed walking in hospital room?: A Little Help needed climbing 3-5 steps with a railing? : A Little 6 Click Score: 22    End of Session Equipment Utilized During Treatment: Gait belt Activity Tolerance: Patient tolerated treatment well Patient left: in bed Nurse Communication: Mobility status PT Visit Diagnosis: Repeated falls (R29.6);Other abnormalities of gait and mobility (R26.89)     Time: 9242-6834 PT Time Calculation (min) (ACUTE ONLY): 26 min  Charges:  $Gait Training: 8-22 mins $Therapeutic Exercise: 8-22 mins                    G Codes:       Governor Rooks, PTA pager [phone]    Cristela Blue 09/30/2016, 4:02 PM

## 2016-10-01 LAB — GLUCOSE, CAPILLARY
GLUCOSE-CAPILLARY: 119 mg/dL — AB (ref 65–99)
GLUCOSE-CAPILLARY: 210 mg/dL — AB (ref 65–99)
Glucose-Capillary: 254 mg/dL — ABNORMAL HIGH (ref 65–99)
Glucose-Capillary: 172 mg/dL — ABNORMAL HIGH (ref 65–99)

## 2016-10-01 NOTE — Progress Notes (Signed)
PROGRESS NOTE   [name]  QHU:[id number]    DOB: 1965-12-22    DOA: 09/07/2016  PCP: Beverly Sessions   I have briefly reviewed patients previous medical records in [facility].  Brief Narrative:  51 year old male with PMH of PTSD, bipolar disorder, ADD, depression, anxiety, stage III chronic kidney disease, seizures and DM 2 who was admitted 09/07/16 for evaluation of altered mental status and fall. He was noted to be noncompliant with his medications and to have been declining over the past year per report from his sister. Initial lab work unremarkable other than elevated ammonia level and CT scan of the head showed no evidence of acute intracranial abnormalities. He was noted to be restless and possibly psychotic on admission.   Assessment & Plan:   Principal Problem:   Dementia, early onset with advanced brain atrophy for age Active Problems:   Bipolar disorder ([place])   Anxiety state   Depression   Essential hypertension   Diabetes type 2, uncontrolled ([place])   Hypokalemia   Acute metabolic encephalopathy   HLD (hyperlipidemia)   PTSD (post-traumatic stress disorder)   Acute encephalopathy   CRI (chronic renal insufficiency), stage 3 (moderate)   Fall   Acute urinary retention   Acute lower UTI   History of seizures   1. Early onset dementia with advanced brain atrophy for age/acute metabolic encephalopathy: Initial workup revealed an elevated ammonia level but folate, B12 and TSH levels were normal. CT head was unremarkable for acute findings. MRI brain also negative for acute findings but did show advanced brain atrophy. Repeat ammonia level normal at 22. He continued to have intermittent agitation/confusion. It is suspected that he has early onset dementia given significant brain atrophy for age and felt unlikely that he will be able to live in an unsupervised environment and his sister is unable to provide him with 24-hour supervision. Psychiatry has seen him multiple times  during this admission. As per their last follow-up on 09/22/16, patient stable on current medication regimen without acute symptoms of depression, anxiety, mania or psychosis, denied suicidal or homicidal ideations, intent or plans but continues to be somewhat confused, forgetful which seems chronic in nature. Psychiatry recommended continuing Seroquel 200 MG HS for mood swings, trazodone 100 MG HS for insomnia, Depakote 1500 MG HS for mood swings/bipolar disorder, Haldol 5 MG every 6 hours when necessary for agitation and combative behaviors, Ativan was discontinued, periodically monitor EKG for QTC prolongation (QTC 432 ms by EKG on 6/11) and valproic acid levels (33 on 09/29/16). Per psychiatry, he did not meet criteria for acute psychiatric admission. Stable without change for the last couple of days. 2. Psychiatric illness including bipolar disorder, depression, anxiety & PTSD: Continue management as indicated in problem #1 as per psychiatry recommendations. Currently stable without agitation. As per nursing documentation, high fall risk from unsteady gait but refuses to follow appropriate precautions in the hospital. As per PT evaluation 6/12, remains high risk for repeated falls. 3. Essential hypertension: Controlled on no medications currently. Amlodipine and propranolol were discontinued due to borderline low blood pressures. Stable. 4. DM type II, uncontrolled: Poorly controlled. A1c 9.4 on 06/02/16. Noncompliant with most aspects of diabetes care at home. Added Lantus 10 units at bedtime on 6/10. Patient refuses diabetic diet and insists on regular diet-changed on 6/11. Change sliding scale to moderate sensitivity and increase mealtime NovoLog to 8 units. CBG is much better controlled except during lunchtime and patient attributes this to testosterone. 5. Hypokalemia: Resolved. 6.  Seizure disorder: Valproic acid level was therapeutic on admission. No reports of recurrent seizures. 7. Stage III chronic  kidney disease: Baseline creatinine appears to be in the 2.2-2.7 range. Creatinine had improved to 1.5 but has increased to 1.84 on 6/11. Euvolemic clinically. Creatinine stable. 8. Falls: History of frequent falls. Evaluated by neurology who recommended MRI brain and spine and findings of that were unremarkable for cause. Truvada discontinued in case it was contributing to his neuropathy. Neurology recommended outpatient follow-up for EMG/neuropsychiatric testing as well as SPEP testing. PT evaluation 6/12, remains high fall risk and continue to recommend SNF. Difficult placement and clinical social worker following >an SNF potential option fell through. 9. Acute urinary retention: Noted on 09/09/16. Flomax started 5/25 and urinary retention resolved. 10. Escherichia coli Acute lower UTI: Felt to be CAUTI related to recent Foley catheterization for UTI. Completed course of ceftriaxone. Treated. 11. HIV screening: Nonreactive 12. Chronic thrombocytopenia: Low platelet count noted dating back to 2017. Seems to have currently resolved.   DVT prophylaxis: Lovenox Code Status: Full Family Communication: None at bedside Disposition: Awaiting placement to SNF. Discussed with clinical social worker and case management.   Consultants:  Psychiatry  Neurology  Procedures:  Foley catheter-discontinued  Antimicrobials:  Completed course of ceftriaxone 3 days    Subjective: Seems engaged about his diabetes care. States that he only eats regular diet for breakfast and rest of the time he is mindful about his carbohydrate intake. Congratulated on improved diabetes care. No pain reported.  ROS: Denies dizziness, lightheadedness or pain elsewhere.  Objective:  Vitals:   09/30/16 0537 09/30/16 1400 09/30/16 2147 10/01/16 0532  BP: 96/62 107/67 115/70 (!) 106/57  Pulse: (!) 55 71 (!) 59 (!) 52  Resp: 18 13 18 18   Temp: 97.5 F (36.4 C)  97.6 F (36.4 C) 97.5 F (36.4 C)  TempSrc: Oral       SpO2: 100% 99% 99% 100%  Weight:      Height:        Examination:  General exam: Pleasant young male lying comfortably supine in bed. Respiratory system: Clear to auscultation. Respiratory effort normal.Stable Cardiovascular system: S1 & S2 heard, RRR. No JVD, murmurs, rubs, gallops or clicks. No pedal edema. Stable Gastrointestinal system: Abdomen is nondistended, soft and nontender. No organomegaly or masses felt. Normal bowel sounds heard. Stable Central nervous system: Alert and oriented. No focal neurological deficits. No change. Extremities: Symmetric 5 x 5 power. Skin: No rashes, lesions or ulcers. No acute findings noted on feet exam on 6/10. Psychiatry: Judgement and insight appear normal. Mood & affect pleasant and interactive.     Data Reviewed: I have personally reviewed following labs and imaging studies  CBC:  Recent Labs Lab 09/25/16 0520  WBC 5.5  NEUTROABS 2.4  HGB 13.0  HCT 38.1*  MCV 87.6  PLT 616   Basic Metabolic Panel:  Recent Labs Lab 09/25/16 0520 09/29/16 0356 09/30/16 0352  NA 137 137 138  K 4.0 4.2 4.1  CL 103 105 102  CO2 25 26 28   GLUCOSE 158* 190* 143*  BUN 18 17 15   CREATININE 1.58* 1.84* 1.71*  CALCIUM 8.7* 8.7* 8.6*   Liver Function Tests:  Recent Labs Lab 09/25/16 0520 09/29/16 0356  AST 14* 12*  ALT 10* 8*  ALKPHOS 71 65  BILITOT 0.6 0.4  PROT 6.4* 6.0*  ALBUMIN 3.0* 2.9*   CBG:  Recent Labs Lab 09/30/16 1227 09/30/16 1723 09/30/16 2144 10/01/16 0812 10/01/16 1216  GLUCAP 232*  134* 136* 119* 254*         Radiology Studies: No results found.      Scheduled Meds: . divalproex  1,500 mg Oral QHS  . enoxaparin (LOVENOX) injection  40 mg Subcutaneous Q24H  . folic acid  1 mg Oral Daily  . gabapentin  300 mg Oral QHS  . insulin aspart  0-15 Units Subcutaneous TID WC  . insulin aspart  0-5 Units Subcutaneous QHS  . insulin aspart  8 Units Subcutaneous TID WC  . insulin glargine  10 Units  Subcutaneous QHS  . potassium chloride  20 mEq Oral Daily  . QUEtiapine  200 mg Oral QHS  . sodium chloride flush  3 mL Intravenous Q12H  . tamsulosin  0.4 mg Oral Daily  . testosterone  5 g Transdermal Daily  . traZODone  100 mg Oral QHS   Continuous Infusions:   LOS: 24 days     Diasia Henken, MD, FACP, FHM. [facility] Pager [phone]  If 7PM-7AM, please contact night-coverage [email or web address] Password [facility] 10/01/2016, 4:43 PM

## 2016-10-02 LAB — GLUCOSE, CAPILLARY
GLUCOSE-CAPILLARY: 159 mg/dL — AB (ref 65–99)
GLUCOSE-CAPILLARY: 170 mg/dL — AB (ref 65–99)
GLUCOSE-CAPILLARY: 186 mg/dL — AB (ref 65–99)
Glucose-Capillary: 178 mg/dL — ABNORMAL HIGH (ref 65–99)

## 2016-10-02 NOTE — Progress Notes (Addendum)
6/15: Sent referral to [place] with [place], [place] and [place]. She stated patient must need help with ADLs.  6/14: [facility] does not accept LOG beds.   Percell Locus Zoie Sarin LCSWA [phone]

## 2016-10-02 NOTE — Progress Notes (Signed)
Occupational Therapy Discharge Patient Details Name: [name] MRN: [id number] DOB: 1965/07/01 Today's Date: 10/02/2016 Time: 1314-3888 OT Time Calculation (min): 22 min  Patient discharged from OT services secondary to goals met, pt modified independent.  Please see latest therapy progress note for current level of functioning and progress toward goals.    Progress and discharge plan discussed with patient and/or caregiver:        Malka So 10/02/2016, 2:38 PM  [phone]

## 2016-10-02 NOTE — Progress Notes (Signed)
Occupational Therapy Treatment and Discharge Patient Details Name: [name] MRN: [id number] DOB: 1965/12/17 Today's Date: 10/02/2016    History of present illness 51 y.o.malewith history significant of PTSD, bipolar disorder, ADD, depression, anxiety, GERD, CKD stage III, seizure d/o, HTN, HLD, and DM who presentedwith altered mental status. The patient's sister reported he had been cognitively declining for the past year w/ multiple falls at home. Non - compliant w/ his medications per family, more confused and restless, reporting auditory and visual hallucinations. CT head in the ED was w/o acute findings.    OT comments  Pt is consistently performing ADL at a modified independent level. Issued a Secondary school teacher at [employer] request and educated in use as pt reports having difficulty donning shorts. Pt reports no difficulty with socks or shoes. Goals are met. No further OT needs.  Follow Up Recommendations  SNF;Supervision/Assistance - 24 hour    Equipment Recommendations       Recommendations for Other Services      Precautions / Restrictions Precautions Precautions: Fall Restrictions Weight Bearing Restrictions: No       Mobility Bed Mobility Overal bed mobility: Modified Independent Bed Mobility: Supine to Sit     Supine to sit: Supervision     General bed mobility comments: No assistance needed.    Transfers Overall transfer level: Modified independent Equipment used: Rolling walker (2 wheeled)   Sit to Stand: Modified independent (Device/Increase time)         General transfer comment: no difficulty    Balance Overall balance assessment: Needs assistance Sitting-balance support: No upper extremity supported;Feet supported Sitting balance-Leahy Scale: Good     Standing balance support: No upper extremity supported Standing balance-Leahy Scale: Fair Standing balance comment: Fair static standing balance during ADL                           ADL  either performed or assessed with clinical judgement   ADL Overall ADL's : Modified independent                                     Functional mobility during ADLs: Modified independent;Rolling walker General ADL Comments: Although pt is able to perform LB dressing without assist, he states it is difficult for him to don his shorts. Issued pt an extra long reacher and instructed in use for LB dressing and for picking items off the floor.     Vision       Perception     Praxis      Cognition Arousal/Alertness: Awake/alert Behavior During Therapy: WFL for tasks assessed/performed Overall Cognitive Status: Impaired/Different from baseline Area of Impairment: Memory                 Orientation Level: Disoriented to;Time                      Exercises     Shoulder Instructions       General Comments      Pertinent Vitals/ Pain       Pain Assessment: No/denies pain  Home Living                                          Prior Functioning/Environment  Frequency           Progress Toward Goals  OT Goals(current goals can now be found in the care plan section)  Progress towards OT goals: Goals met/education completed, patient discharged from OT  Acute Rehab OT Goals Patient Stated Goal: be able to put his pants on with greater ease Potential to Achieve Goals: Good  Plan Discharge plan remains appropriate    Co-evaluation                 AM-PAC PT "6 Clicks" Daily Activity     Outcome Measure   Help from another person eating meals?: None Help from another person taking care of personal grooming?: None Help from another person toileting, which includes using toliet, bedpan, or urinal?: None Help from another person bathing (including washing, rinsing, drying)?: None Help from another person to put on and taking off regular upper body clothing?: None Help from another person to put on and  taking off regular lower body clothing?: None 6 Click Score: 24    End of Session Equipment Utilized During Treatment: Rolling walker  OT Visit Diagnosis: Unsteadiness on feet (R26.81);Other symptoms and signs involving cognitive function   Activity Tolerance Patient tolerated treatment well   Patient Left in bed;with call bell/phone within reach   Nurse Communication          Time: 4961-1643 OT Time Calculation (min): 22 min  Charges: OT General Charges $OT Visit: 1 Procedure OT Treatments $Self Care/Home Management : 8-22 mins   Malka So 10/02/2016, 2:36 PM  [phone]

## 2016-10-02 NOTE — Progress Notes (Signed)
Physical Therapy Treatment Patient Details Name: [name] MRN: [id number] DOB: 1965/05/20 Today's Date: 10/02/2016    History of Present Illness 51 y.o.malewith history significant of PTSD, bipolar disorder, ADD, depression, anxiety, GERD, CKD stage III, seizure d/o, HTN, HLD, and DM who presentedwith altered mental status. The patient's sister reported he had been cognitively declining for the past year w/ multiple falls at home. Non - compliant w/ his medications per family, more confused and restless, reporting auditory and visual hallucinations. CT head in the ED was w/o acute findings.     PT Comments    Pt demonstrates improved tolerance for gait and transfers this session. Due to cognitive deficits, pt continues to be a high risk for falls and requires 24hr supervision. Pt will benefit from continued acute therapy in order to advance toward recommended venue below.     Follow Up Recommendations  SNF;Supervision/Assistance - 24 hour     Equipment Recommendations  None recommended by PT    Recommendations for Other Services       Precautions / Restrictions Precautions Precautions: Fall Restrictions Weight Bearing Restrictions: No    Mobility  Bed Mobility Overal bed mobility: Needs Assistance Bed Mobility: Supine to Sit     Supine to sit: Supervision     General bed mobility comments: No assistance needed.    Transfers Overall transfer level: Modified independent Equipment used: Straight cane   Sit to Stand: Modified independent (Device/Increase time)         General transfer comment: Slow and guarded to ascend but no assistance or cueing needed.    Ambulation/Gait Ambulation/Gait assistance: Min guard Ambulation Distance (Feet): 50 Feet Assistive device: Straight cane Gait Pattern/deviations: Step-through pattern;Trunk flexed;Decreased stride length;Narrow base of support Gait velocity: decreased Gait velocity interpretation: Below normal speed  for age/gender General Gait Details: Cues for upright posture and sequencing with [place]. Improved stability this session.    Stairs Stairs: Yes   Stair Management: One rail Left;Forwards;With cane Number of Stairs: 15 General stair comments: Min guard for safety  Wheelchair Mobility    Modified Rankin (Stroke Patients Only)       Balance Overall balance assessment: Needs assistance Sitting-balance support: No upper extremity supported;Feet supported Sitting balance-Leahy Scale: Good     Standing balance support: No upper extremity supported Standing balance-Leahy Scale: Fair Standing balance comment: Fair static standing balance                             Cognition Arousal/Alertness: Awake/alert Behavior During Therapy: WFL for tasks assessed/performed Overall Cognitive Status: Impaired/Different from baseline Area of Impairment: Memory                 Orientation Level: Disoriented to;Time                    Exercises      General Comments        Pertinent Vitals/Pain Pain Assessment: No/denies pain    Home Living                      Prior Function            PT Goals (current goals can now be found in the care plan section) Acute Rehab PT Goals Patient Stated Goal: To get up and move more. Progress towards PT goals: Progressing toward goals    Frequency    Min 3X/week  PT Plan Current plan remains appropriate    Co-evaluation              AM-PAC PT "6 Clicks" Daily Activity  Outcome Measure  Difficulty turning over in bed (including adjusting bedclothes, sheets and blankets)?: None Difficulty moving from lying on back to sitting on the side of the bed? : None Difficulty sitting down on and standing up from a chair with arms (e.g., wheelchair, bedside commode, etc,.)?: None Help needed moving to and from a bed to chair (including a wheelchair)?: None Help needed walking in hospital room?: A  Little Help needed climbing 3-5 steps with a railing? : A Little 6 Click Score: 22    End of Session Equipment Utilized During Treatment: Gait belt Activity Tolerance: Patient tolerated treatment well Patient left: in bed;with call bell/phone within reach Nurse Communication: Mobility status PT Visit Diagnosis: Repeated falls (R29.6);Other abnormalities of gait and mobility (R26.89)     Time: 8127-5170 PT Time Calculation (min) (ACUTE ONLY): 26 min  Charges:  $Gait Training: 8-22 mins $Therapeutic Activity: 8-22 mins                    G Codes:       Scheryl Marten PT, DPT  [phone]    Jacqulyn Liner Sloan Leiter 10/02/2016, 1:12 PM

## 2016-10-02 NOTE — Progress Notes (Signed)
PROGRESS NOTE   [name]  [id number]:[id number]    DOB: 1965/07/05    DOA: 09/07/2016  PCP: Beverly Sessions   I have briefly reviewed patients previous medical records in [facility].  Brief Narrative:  51 year old male with PMH of PTSD, bipolar disorder, ADD, depression, anxiety, stage III chronic kidney disease, seizures and DM 2 who was admitted 09/07/16 for evaluation of altered mental status and fall. He was noted to be noncompliant with his medications and to have been declining over the past year per report from his sister. Initial lab work unremarkable other than elevated ammonia level and CT scan of the head showed no evidence of acute intracranial abnormalities. He was noted to be restless and possibly psychotic on admission.   Assessment & Plan:   Principal Problem:   Dementia, early onset with advanced brain atrophy for age Active Problems:   Bipolar disorder ([place])   Anxiety state   Depression   Essential hypertension   Diabetes type 2, uncontrolled ([place])   Hypokalemia   Acute metabolic encephalopathy   HLD (hyperlipidemia)   PTSD (post-traumatic stress disorder)   Acute encephalopathy   CRI (chronic renal insufficiency), stage 3 (moderate)   Fall   Acute urinary retention   Acute lower UTI   History of seizures   1. Early onset dementia with advanced brain atrophy for age/acute metabolic encephalopathy: Initial workup revealed an elevated ammonia level but folate, B12 and TSH levels were normal. CT head was unremarkable for acute findings. MRI brain also negative for acute findings but did show advanced brain atrophy. Repeat ammonia level normal at 22. He continued to have intermittent agitation/confusion. It is suspected that he has early onset dementia given significant brain atrophy for age and felt unlikely that he will be able to live in an unsupervised environment and his sister is unable to provide him with 24-hour supervision. Psychiatry has seen him multiple times  during this admission. As per their last follow-up on 09/22/16, patient stable on current medication regimen without acute symptoms of depression, anxiety, mania or psychosis, denied suicidal or homicidal ideations, intent or plans but continues to be somewhat confused, forgetful which seems chronic in nature. Psychiatry recommended continuing Seroquel 200 MG HS for mood swings, trazodone 100 MG HS for insomnia, Depakote 1500 MG HS for mood swings/bipolar disorder, Haldol 5 MG every 6 hours when necessary for agitation and combative behaviors, Ativan was discontinued, periodically monitor EKG for QTC prolongation (QTC 432 ms by EKG on 6/11) and valproic acid levels (33 on 09/29/16). Per psychiatry, he did not meet criteria for acute psychiatric admission. Stable without change for the last couple of days. 2. Psychiatric illness including bipolar disorder, depression, anxiety & PTSD: Continue management as indicated in problem #1 as per psychiatry recommendations. Currently stable without agitation. As per nursing documentation, high fall risk from unsteady gait but refuses to follow appropriate precautions in the hospital. As per PT evaluation 6/14, although improvement in gait tolerance and transfers, due to cognitive deficit, patient continues to be high risk for falls and requires 24-hour supervision. 3. Essential hypertension: Controlled on no medications currently. Amlodipine and propranolol were discontinued due to borderline low blood pressures. Stable. 4. DM type II, uncontrolled: Poorly controlled. A1c 9.4 on 06/02/16. Noncompliant with most aspects of diabetes care at home. Added Lantus 10 units at bedtime on 6/10. Patient refuses diabetic diet and insists on regular diet-changed on 6/11. Change sliding scale to moderate sensitivity and increase mealtime NovoLog to 8 units. CBG  is much better controlled except during lunchtime and patient attributes this to testosterone. 5. Hypokalemia:  Resolved. 6. Seizure disorder: Valproic acid level was therapeutic on admission. No reports of recurrent seizures. 7. Stage III chronic kidney disease: Baseline creatinine appears to be in the 2.2-2.7 range. Creatinine had improved to 1.5 but has increased to 1.84 on 6/11. Euvolemic clinically. Creatinine stable. 8. Falls: History of frequent falls. Evaluated by neurology who recommended MRI brain and spine and findings of that were unremarkable for cause. Truvada discontinued in case it was contributing to his neuropathy. Neurology recommended outpatient follow-up for EMG/neuropsychiatric testing as well as SPEP testing. PT evaluation 6/12, remains high fall risk and continue to recommend SNF. Difficult placement and clinical social worker following >an SNF potential option fell through. 9. Acute urinary retention: Noted on 09/09/16. Flomax started 5/25 and urinary retention resolved. 10. Escherichia coli Acute lower UTI: Felt to be CAUTI related to recent Foley catheterization for UTI. Completed course of ceftriaxone. Treated. 11. HIV screening: Nonreactive 12. Chronic thrombocytopenia: Low platelet count noted dating back to 2017. Seems to have currently resolved.   DVT prophylaxis: Lovenox Code Status: Full Family Communication: None at bedside Disposition: Awaiting placement to SNF. Patient's case discussed daily with social worker during morning rounds.   Consultants:  Psychiatry  Neurology  Procedures:  Foley catheter-discontinued  Antimicrobials:  Completed course of ceftriaxone 3 days    Subjective: Ongoing intermittent pain across balls of both toes> reports numbness in both feet since back surgery several years ago. Denies any other complaints.  ROS: Denies dizziness, lightheadedness or pain elsewhere.  Objective:  Vitals:   10/01/16 0532 10/01/16 2135 10/02/16 0521 10/02/16 1344  BP: (!) 106/57 (!) 141/78 (!) 152/92 109/66  Pulse: (!) 52 67 87 91  Resp: 18 20 18 18    Temp: 97.5 F (36.4 C) 98.2 F (36.8 C) 97.7 F (36.5 C) 98.8 F (37.1 C)  TempSrc:  Oral Oral Oral  SpO2: 100% 99% 93% 95%  Weight:      Height:        Examination:  General exam: Pleasant young male lying comfortably supine in bed. Respiratory system: Clear to auscultation. Respiratory effort normal. Stable Cardiovascular system: S1 & S2 heard, RRR. No JVD, murmurs, rubs, gallops or clicks. No pedal edema. Stable Gastrointestinal system: Abdomen is nondistended, soft and nontender. No organomegaly or masses felt. Normal bowel sounds heard. Stable Central nervous system: Alert and oriented. No focal neurological deficits. No change/Stable. Extremities: Symmetric 5 x 5 power. Skin: No rashes, lesions or ulcers. No acute findings noted on feet exam today. Psychiatry: Judgement and insight appear normal. Mood & affect pleasant and interactive.     Data Reviewed: I have personally reviewed following labs and imaging studies  CBC: No results for input(s): WBC, NEUTROABS, HGB, HCT, MCV, PLT in the last 168 hours. Basic Metabolic Panel:  Recent Labs Lab 09/29/16 0356 09/30/16 0352  NA 137 138  K 4.2 4.1  CL 105 102  CO2 26 28  GLUCOSE 190* 143*  BUN 17 15  CREATININE 1.84* 1.71*  CALCIUM 8.7* 8.6*   Liver Function Tests:  Recent Labs Lab 09/29/16 0356  AST 12*  ALT 8*  ALKPHOS 65  BILITOT 0.4  PROT 6.0*  ALBUMIN 2.9*   CBG:  Recent Labs Lab 10/01/16 1216 10/01/16 1712 10/01/16 2132 10/02/16 0815 10/02/16 1247  GLUCAP 254* 172* 210* 159* 178*         Radiology Studies: No results found.  Scheduled Meds: . divalproex  1,500 mg Oral QHS  . enoxaparin (LOVENOX) injection  40 mg Subcutaneous Q24H  . folic acid  1 mg Oral Daily  . gabapentin  300 mg Oral QHS  . insulin aspart  0-15 Units Subcutaneous TID WC  . insulin aspart  0-5 Units Subcutaneous QHS  . insulin aspart  8 Units Subcutaneous TID WC  . insulin glargine  10 Units  Subcutaneous QHS  . potassium chloride  20 mEq Oral Daily  . QUEtiapine  200 mg Oral QHS  . sodium chloride flush  3 mL Intravenous Q12H  . tamsulosin  0.4 mg Oral Daily  . testosterone  5 g Transdermal Daily  . traZODone  100 mg Oral QHS   Continuous Infusions:   LOS: 25 days     Baron Parmelee, MD, FACP, FHM. [facility] Pager [phone]  If 7PM-7AM, please contact night-coverage [email or web address] Password [facility] 10/02/2016, 2:27 PM

## 2016-10-03 DIAGNOSIS — K5900 Constipation, unspecified: Secondary | ICD-10-CM

## 2016-10-03 LAB — GLUCOSE, CAPILLARY
Glucose-Capillary: 132 mg/dL — ABNORMAL HIGH (ref 65–99)
Glucose-Capillary: 196 mg/dL — ABNORMAL HIGH (ref 65–99)
Glucose-Capillary: 227 mg/dL — ABNORMAL HIGH (ref 65–99)
Glucose-Capillary: 236 mg/dL — ABNORMAL HIGH (ref 65–99)

## 2016-10-03 MED ORDER — HYDROCORTISONE ACETATE 25 MG RE SUPP
25.0000 mg | Freq: Every day | RECTAL | Status: DC | PRN
  Administered 2016-10-04 (×2): 25 mg via RECTAL
  Filled 2016-10-03 (×2): qty 1

## 2016-10-03 MED ORDER — DOCUSATE SODIUM 100 MG PO CAPS
100.0000 mg | ORAL_CAPSULE | Freq: Two times a day (BID) | ORAL | Status: DC
  Administered 2016-10-03 – 2016-10-13 (×20): 100 mg via ORAL
  Filled 2016-10-03 (×20): qty 1

## 2016-10-03 MED ORDER — POLYETHYLENE GLYCOL 3350 17 G PO PACK
17.0000 g | PACK | Freq: Two times a day (BID) | ORAL | Status: DC
  Administered 2016-10-03 – 2016-10-13 (×15): 17 g via ORAL
  Filled 2016-10-03 (×17): qty 1

## 2016-10-03 NOTE — Progress Notes (Signed)
PROGRESS NOTE   [name]  [id number]    DOB: [birth year]    DOA: 09/07/2016  PCP: Beverly Sessions   I have briefly reviewed patients previous medical records in [facility].  Brief Narrative:  51 year old male with PMH of PTSD, bipolar disorder, ADD, depression, anxiety, stage III chronic kidney disease, seizures and DM 2 who was admitted 09/07/16 for evaluation of altered mental status and fall. He was noted to be noncompliant with his medications and to have been declining over the past year per report from his sister. Initial lab work unremarkable other than elevated ammonia level and CT scan of the head showed no evidence of acute intracranial abnormalities. He was noted to be restless and possibly psychotic on admission.   Assessment & Plan:   Principal Problem:   Dementia, early onset with advanced brain atrophy for age Active Problems:   Bipolar disorder ([place])   Anxiety state   Depression   Essential hypertension   Diabetes type 2, uncontrolled ([place])   Hypokalemia   Acute metabolic encephalopathy   HLD (hyperlipidemia)   PTSD (post-traumatic stress disorder)   Acute encephalopathy   CRI (chronic renal insufficiency), stage 3 (moderate)   Fall   Acute urinary retention   Acute lower UTI   History of seizures   1. Early onset dementia with advanced brain atrophy for age/acute metabolic encephalopathy: Initial workup revealed an elevated ammonia level but folate, B12 and TSH levels were normal. CT head was unremarkable for acute findings. MRI brain also negative for acute findings but did show advanced brain atrophy. Repeat ammonia level normal at 22. He continued to have intermittent agitation/confusion. It is suspected that he has early onset dementia given significant brain atrophy for age and felt unlikely that he will be able to live in an unsupervised environment and his sister is unable to provide him with 24-hour supervision. Psychiatry has seen him multiple times  during this admission. As per their last follow-up on 09/22/16, patient stable on current medication regimen without acute symptoms of depression, anxiety, mania or psychosis, denied suicidal or homicidal ideations, intent or plans but continues to be somewhat confused, forgetful which seems chronic in nature. Psychiatry recommended continuing Seroquel 200 MG HS for mood swings, trazodone 100 MG HS for insomnia, Depakote 1500 MG HS for mood swings/bipolar disorder, Haldol 5 MG every 6 hours when necessary for agitation and combative behaviors, Ativan was discontinued, periodically monitor EKG for QTC prolongation (QTC 432 ms by EKG on 6/11) and valproic acid levels (33 on 09/29/16). Per psychiatry, he did not meet criteria for acute psychiatric admission. Stable without change. 2. Psychiatric illness including bipolar disorder, depression, anxiety & PTSD: Continue management as indicated in problem #1 as per psychiatry recommendations. Currently stable without agitation. As per nursing documentation, high fall risk from unsteady gait but refuses to follow appropriate precautions in the hospital. As per PT evaluation 6/14, although improvement in gait tolerance and transfers, due to cognitive deficit, patient continues to be high risk for falls and requires 24-hour supervision. 3. Essential hypertension: Controlled on no medications currently. Amlodipine and propranolol were discontinued due to borderline low blood pressures. Transient soft blood pressures noted this morning,? Error. Subsequent BP OK. 4. DM type II, uncontrolled: Poorly controlled. A1c 9.4 on 06/02/16. Noncompliant with most aspects of diabetes care at home. Added Lantus 10 units at bedtime on 6/10. Patient refuses diabetic diet and insists on regular diet-changed on 6/11. Change sliding scale to moderate sensitivity and increase mealtime NovoLog  to 8 units. CBG is mildly uncontrolled and fluctuating. Continue without change. 5. Hypokalemia:  Resolved. 6. Seizure disorder: Valproic acid level was therapeutic on admission. No reports of recurrent seizures. 7. Stage III chronic kidney disease: Baseline creatinine appears to be in the 2.2-2.7 range. Creatinine had improved to 1.5 but has increased to 1.84 on 6/11. Euvolemic clinically. Creatinine stable. 8. Falls: History of frequent falls. Evaluated by neurology who recommended MRI brain and spine and findings of that were unremarkable for cause. Truvada discontinued in case it was contributing to his neuropathy. Neurology recommended outpatient follow-up for EMG/neuropsychiatric testing as well as SPEP testing. PT evaluation 6/12, remains high fall risk and continue to recommend SNF. Difficult placement and clinical social worker following >an SNF potential option fell through. 9. Acute urinary retention: Noted on 09/09/16. Flomax started 5/25 and urinary retention resolved. 10. Escherichia coli Acute lower UTI: Felt to be CAUTI related to recent Foley catheterization for UTI. Completed course of ceftriaxone. Treated. 11. HIV screening: Nonreactive 12. Chronic thrombocytopenia: Low platelet count noted dating back to 2017. Seems to have currently resolved. 13. Constipation/hemorrhoids: reports constipation, rectal pain without bleeding. Bowel regimen started.   DVT prophylaxis: Lovenox Code Status: Full Family Communication: None at bedside Disposition: Awaiting placement to SNF. Patient's case discussed daily with social worker during morning rounds.   Consultants:  Psychiatry  Neurology  Procedures:  Foley catheter-discontinued  Antimicrobials:  Completed course of ceftriaxone 3 days    Subjective: Better spirits today. Reported constipation, last BM today but hard stools and rectal pain without bleeding.  ROS: Denies dizziness, lightheadedness or pain elsewhere.  Objective:  Vitals:   10/02/16 1344 10/02/16 2216 10/03/16 0656 10/03/16 1459  BP: 109/66 120/65 (!)  85/53 117/72  Pulse: 91 64 (!) 58 95  Resp: 18 20 18 19   Temp: 98.8 F (37.1 C) 98.2 F (36.8 C) 98.6 F (37 C) 98.3 F (36.8 C)  TempSrc: Oral     SpO2: 95% 99% 98% 98%  Weight:      Height:        Examination:  General exam: Seen ambulating comfortably to his room door. Respiratory system: Clear to auscultation. Respiratory effort normal. Stable Cardiovascular system: S1 & S2 heard, RRR. No JVD, murmurs, rubs, gallops or clicks. No pedal edema. Stable without change Gastrointestinal system: Abdomen is nondistended, soft and nontender. No organomegaly or masses felt. Normal bowel sounds heard. Stable Central nervous system: Alert and oriented. No focal neurological deficits. Stable Extremities: Symmetric 5 x 5 power. Skin: No rashes, lesions or ulcers. No acute findings noted on feet exam today. Psychiatry: Judgement and insight appear normal. Mood & affect pleasant and interactive.     Data Reviewed: I have personally reviewed following labs and imaging studies  CBC: No results for input(s): WBC, NEUTROABS, HGB, HCT, MCV, PLT in the last 168 hours. Basic Metabolic Panel:  Recent Labs Lab 09/29/16 0356 09/30/16 0352  NA 137 138  K 4.2 4.1  CL 105 102  CO2 26 28  GLUCOSE 190* 143*  BUN 17 15  CREATININE 1.84* 1.71*  CALCIUM 8.7* 8.6*   Liver Function Tests:  Recent Labs Lab 09/29/16 0356  AST 12*  ALT 8*  ALKPHOS 65  BILITOT 0.4  PROT 6.0*  ALBUMIN 2.9*   CBG:  Recent Labs Lab 10/02/16 1628 10/02/16 2219 10/03/16 0817 10/03/16 1148 10/03/16 1724  GLUCAP 170* 186* 132* 196* 227*         Radiology Studies: No results found.  Scheduled Meds: . divalproex  1,500 mg Oral QHS  . docusate sodium  100 mg Oral BID  . enoxaparin (LOVENOX) injection  40 mg Subcutaneous Q24H  . folic acid  1 mg Oral Daily  . gabapentin  300 mg Oral QHS  . insulin aspart  0-15 Units Subcutaneous TID WC  . insulin aspart  0-5 Units Subcutaneous QHS  .  insulin aspart  8 Units Subcutaneous TID WC  . insulin glargine  10 Units Subcutaneous QHS  . polyethylene glycol  17 g Oral BID  . potassium chloride  20 mEq Oral Daily  . QUEtiapine  200 mg Oral QHS  . sodium chloride flush  3 mL Intravenous Q12H  . tamsulosin  0.4 mg Oral Daily  . testosterone  5 g Transdermal Daily  . traZODone  100 mg Oral QHS   Continuous Infusions:   LOS: 26 days     Edlin Ford, MD, FACP, FHM. [facility] Pager [phone]  If 7PM-7AM, please contact night-coverage [email or web address] Password [facility] 10/03/2016, 5:32 PM

## 2016-10-03 NOTE — Progress Notes (Signed)
Physical Therapy Treatment Patient Details Name: [name] MRN: [id number] DOB: 1966-02-19 Today's Date: 10/03/2016    History of Present Illness 51 y.o.malewith history significant of PTSD, bipolar disorder, ADD, depression, anxiety, GERD, CKD stage III, seizure d/o, HTN, HLD, and DM who presentedwith altered mental status. The patient's sister reported he had been cognitively declining for the past year w/ multiple falls at home. Non - compliant w/ his medications per family, more confused and restless, reporting auditory and visual hallucinations. CT head in the ED was w/o acute findings.     PT Comments    Pt performed gait and functional mobility during session.  Pt is now independent with bed mobility and transfers.  He continues to present with gait deviations that worsen as he fatigues.  Pt also remains to present with Short Term memory deficits and is at risk for managing medications inappropriately and causing repeated falls.  Pt presents with strength deficits at this time.     Follow Up Recommendations  Home health PT;Supervision - Intermittent @ALF  to manage medications and assist with ambulation as needed.       Equipment Recommendations  None recommended by PT    Recommendations for Other Services       Precautions / Restrictions Precautions Precautions: Fall Restrictions Weight Bearing Restrictions: No    Mobility  Bed Mobility Overal bed mobility: Modified Independent Bed Mobility: Supine to Sit     Supine to sit: Modified independent (Device/Increase time) Sit to supine: Modified independent (Device/Increase time)   General bed mobility comments: No assistance needed.    Transfers Overall transfer level: Modified independent Equipment used: Rolling walker (2 wheeled) Transfers: Sit to/from Stand Sit to Stand: Modified independent (Device/Increase time)         General transfer comment: no difficulty  Ambulation/Gait Ambulation/Gait  assistance: Min guard Ambulation Distance (Feet):  (150 x2 as patient fatigues he require assistance for safety.  ) Assistive device: Rolling walker (2 wheeled) Gait Pattern/deviations: Step-through pattern;Narrow base of support;Scissoring Gait velocity: decreased Gait velocity interpretation: Below normal speed for age/gender General Gait Details: Pt required cues for increasing BOS, B foot clearance, and upper trunk control.     Stairs Stairs: Yes   Stair Management: Forwards;Two rails Number of Stairs: 30 General stair comments: Min guard for safety, patient fatigued after 15 stairs and required rest break in a seated position.  Wheelchair Mobility    Modified Rankin (Stroke Patients Only)       Balance Overall balance assessment: Needs assistance   Sitting balance-Leahy Scale: Good       Standing balance-Leahy Scale: Fair Standing balance comment: UE reliance on device                            Cognition Arousal/Alertness: Awake/alert Behavior During Therapy: WFL for tasks assessed/performed Overall Cognitive Status: Impaired/Different from baseline Area of Impairment: Memory                 Orientation Level: Disoriented to;Time                    Exercises      General Comments        Pertinent Vitals/Pain Pain Assessment: No/denies pain    Home Living                      Prior Function  PT Goals (current goals can now be found in the care plan section) Acute Rehab PT Goals Patient Stated Goal: To be independent with his mobility.   Potential to Achieve Goals: Fair Progress towards PT goals: Progressing toward goals    Frequency    Min 3X/week      PT Plan Discharge plan needs to be updated    Co-evaluation              AM-PAC PT "6 Clicks" Daily Activity  Outcome Measure  Difficulty turning over in bed (including adjusting bedclothes, sheets and blankets)?: None Difficulty  moving from lying on back to sitting on the side of the bed? : None Difficulty sitting down on and standing up from a chair with arms (e.g., wheelchair, bedside commode, etc,.)?: None Help needed moving to and from a bed to chair (including a wheelchair)?: None Help needed walking in hospital room?: A Little Help needed climbing 3-5 steps with a railing? : A Little 6 Click Score: 22    End of Session Equipment Utilized During Treatment: Gait belt Activity Tolerance: Patient limited by fatigue Patient left: in bed;with call bell/phone within reach Nurse Communication: Mobility status PT Visit Diagnosis: Repeated falls (R29.6);Other abnormalities of gait and mobility (R26.89)     Time: 3200-3794 PT Time Calculation (min) (ACUTE ONLY): 23 min  Charges:  $Gait Training: 23-37 mins                    G Codes:       Governor Rooks, PTA pager [place] 10/03/2016, 3:18 PM

## 2016-10-04 LAB — GLUCOSE, CAPILLARY
GLUCOSE-CAPILLARY: 182 mg/dL — AB (ref 65–99)
GLUCOSE-CAPILLARY: 182 mg/dL — AB (ref 65–99)
GLUCOSE-CAPILLARY: 193 mg/dL — AB (ref 65–99)
Glucose-Capillary: 194 mg/dL — ABNORMAL HIGH (ref 65–99)

## 2016-10-04 NOTE — Progress Notes (Signed)
PROGRESS NOTE   [name]  ZWC:[id number]    DOB: 1966/01/11    DOA: 09/07/2016  PCP: Beverly Sessions   I have briefly reviewed patients previous medical records in [facility].  Brief Narrative:  51 year old male with PMH of PTSD, bipolar disorder, ADD, depression, anxiety, stage III chronic kidney disease, seizures and DM 2 who was admitted 09/07/16 for evaluation of altered mental status and fall. He was noted to be noncompliant with his medications and to have been declining over the past year per report from his sister. Initial lab work unremarkable other than elevated ammonia level and CT scan of the head showed no evidence of acute intracranial abnormalities. He was noted to be restless and possibly psychotic on admission.   Assessment & Plan:   Principal Problem:   Dementia, early onset with advanced brain atrophy for age Active Problems:   Bipolar disorder ([place])   Anxiety state   Depression   Essential hypertension   Diabetes type 2, uncontrolled ([place])   Hypokalemia   Acute metabolic encephalopathy   HLD (hyperlipidemia)   PTSD (post-traumatic stress disorder)   Acute encephalopathy   CRI (chronic renal insufficiency), stage 3 (moderate)   Fall   Acute urinary retention   Acute lower UTI   History of seizures   Constipation   1. Early onset dementia with advanced brain atrophy for age/acute metabolic encephalopathy: Initial workup revealed an elevated ammonia level but folate, B12 and TSH levels were normal. CT head was unremarkable for acute findings. MRI brain also negative for acute findings but did show advanced brain atrophy. Repeat ammonia level normal at 22. He continued to have intermittent agitation/confusion. It is suspected that he has early onset dementia given significant brain atrophy for age and felt unlikely that he will be able to live in an unsupervised environment and his sister is unable to provide him with 24-hour supervision. Psychiatry has seen him  multiple times during this admission. As per their last follow-up on 09/22/16, patient stable on current medication regimen without acute symptoms of depression, anxiety, mania or psychosis, denied suicidal or homicidal ideations, intent or plans but continues to be somewhat confused, forgetful which seems chronic in nature. Psychiatry recommended continuing Seroquel 200 MG HS for mood swings, trazodone 100 MG HS for insomnia, Depakote 1500 MG HS for mood swings/bipolar disorder, Haldol 5 MG every 6 hours when necessary for agitation and combative behaviors, Ativan was discontinued, periodically monitor EKG for QTC prolongation (QTC 432 ms by EKG on 6/11) and valproic acid levels (33 on 09/29/16). Per psychiatry, he did not meet criteria for acute psychiatric admission. Stable without change. 2. Psychiatric illness including bipolar disorder, depression, anxiety & PTSD: Continue management as indicated in problem #1 as per psychiatry recommendations. Currently stable without agitation.  3. Essential hypertension: Controlled on no medications currently. Amlodipine and propranolol were discontinued due to borderline low blood pressures.  4. DM type II, uncontrolled: Poorly controlled. A1c 9.4 on 06/02/16. Noncompliant with most aspects of diabetes care at home. Added Lantus 10 units at bedtime on 6/10. Patient refuses diabetic diet and insists on regular diet-changed on 6/11. Change sliding scale to moderate sensitivity and increase mealtime NovoLog to 8 units. CBG is mildly uncontrolled and fluctuating. Continue without change. 5. Hypokalemia: Resolved. 6. Seizure disorder: Valproic acid level was therapeutic on admission. No reports of recurrent seizures. 7. Stage III chronic kidney disease: Baseline creatinine appears to be in the 2.2-2.7 range. Creatinine had improved to 1.5 but has increased  to 1.84 on 6/11. Euvolemic clinically. Creatinine stable. 8. Falls: History of frequent falls. Evaluated by neurology  who recommended MRI brain and spine and findings of that were unremarkable for cause. Truvada discontinued in case it was contributing to his neuropathy. Neurology recommended outpatient follow-up for EMG/neuropsychiatric testing as well as SPEP testing. PT evaluation 6/15 >recommends ALF. Follow next week with Education officer, [facility]. 9. Acute urinary retention: Noted on 09/09/16. Flomax started 5/25 and urinary retention resolved. 10. Escherichia coli Acute lower UTI: Felt to be CAUTI related to recent Foley catheterization for UTI. Completed course of ceftriaxone. Treated. 11. HIV screening: Nonreactive 12. Chronic thrombocytopenia: Low platelet count noted dating back to 2017. Seems to have currently resolved. 13. Constipation/hemorrhoids: reports constipation, rectal pain without bleeding. Bowel regimen started.    DVT prophylaxis: Lovenox Code Status: Full Family Communication: None at bedside Disposition: Awaiting placement, likely to ALF now.   Consultants:  Psychiatry  Neurology  Procedures:  Foley catheter-discontinued  Antimicrobials:  Completed course of ceftriaxone 3 days    Subjective: Denies BM since yesterday. No rectal pain. No other complaints reported.  ROS: Denies dizziness, lightheadedness or pain elsewhere.  Objective:  Vitals:   10/03/16 1459 10/03/16 2224 10/04/16 0643 10/04/16 1503  BP: 117/72 140/80 122/87 113/78  Pulse: 95 72 73 92  Resp: 19 18 17 17   Temp: 98.3 F (36.8 C) 98.2 F (36.8 C) 97.5 F (36.4 C) 97.9 F (36.6 C)  TempSrc:  Oral Oral   SpO2: 98% 97% 97% 96%  Weight:      Height:        Examination: No changes today.  General exam: Seen ambulating comfortably to his room door. Respiratory system: Clear to auscultation. Respiratory effort normal. Stable Cardiovascular system: S1 & S2 heard, RRR. No JVD, murmurs, rubs, gallops or clicks. No pedal edema. Stable without change Gastrointestinal system: Abdomen is nondistended, soft and  nontender. No organomegaly or masses felt. Normal bowel sounds heard. Stable Central nervous system: Alert and oriented. No focal neurological deficits. Stable Extremities: Symmetric 5 x 5 power. Skin: No rashes, lesions or ulcers. No acute findings noted on feet exam today. Psychiatry: Judgement and insight appear normal. Mood & affect pleasant and interactive.     Data Reviewed: I have personally reviewed following labs and imaging studies  CBC: No results for input(s): WBC, NEUTROABS, HGB, HCT, MCV, PLT in the last 168 hours. Basic Metabolic Panel:  Recent Labs Lab 09/29/16 0356 09/30/16 0352  NA 137 138  K 4.2 4.1  CL 105 102  CO2 26 28  GLUCOSE 190* 143*  BUN 17 15  CREATININE 1.84* 1.71*  CALCIUM 8.7* 8.6*   Liver Function Tests:  Recent Labs Lab 09/29/16 0356  AST 12*  ALT 8*  ALKPHOS 65  BILITOT 0.4  PROT 6.0*  ALBUMIN 2.9*   CBG:  Recent Labs Lab 10/03/16 1148 10/03/16 1724 10/03/16 2228 10/04/16 0744 10/04/16 1221  GLUCAP 196* 227* 236* 182* 194*         Radiology Studies: No results found.      Scheduled Meds: . divalproex  1,500 mg Oral QHS  . docusate sodium  100 mg Oral BID  . enoxaparin (LOVENOX) injection  40 mg Subcutaneous Q24H  . folic acid  1 mg Oral Daily  . gabapentin  300 mg Oral QHS  . insulin aspart  0-15 Units Subcutaneous TID WC  . insulin aspart  0-5 Units Subcutaneous QHS  . insulin aspart  8 Units Subcutaneous TID WC  .  insulin glargine  10 Units Subcutaneous QHS  . polyethylene glycol  17 g Oral BID  . potassium chloride  20 mEq Oral Daily  . QUEtiapine  200 mg Oral QHS  . sodium chloride flush  3 mL Intravenous Q12H  . tamsulosin  0.4 mg Oral Daily  . testosterone  5 g Transdermal Daily  . traZODone  100 mg Oral QHS   Continuous Infusions:   LOS: 79 days     Britanie Harshman, MD, FACP, FHM. [facility] Pager [phone]  If 7PM-7AM, please contact night-coverage [email or web address] Password  [email or web address] 10/04/2016, 3:10 PM

## 2016-10-05 DIAGNOSIS — R338 Other retention of urine: Secondary | ICD-10-CM

## 2016-10-05 DIAGNOSIS — G9341 Metabolic encephalopathy: Principal | ICD-10-CM

## 2016-10-05 LAB — COMPREHENSIVE METABOLIC PANEL
ALT: 9 U/L — ABNORMAL LOW (ref 17–63)
AST: 13 U/L — ABNORMAL LOW (ref 15–41)
Albumin: 3.1 g/dL — ABNORMAL LOW (ref 3.5–5.0)
Alkaline Phosphatase: 66 U/L (ref 38–126)
Anion gap: 7 (ref 5–15)
BILIRUBIN TOTAL: 0.4 mg/dL (ref 0.3–1.2)
BUN: 16 mg/dL (ref 6–20)
CO2: 27 mmol/L (ref 22–32)
Calcium: 8.9 mg/dL (ref 8.9–10.3)
Chloride: 104 mmol/L (ref 101–111)
Creatinine, Ser: 1.98 mg/dL — ABNORMAL HIGH (ref 0.61–1.24)
GFR calc Af Amer: 44 mL/min — ABNORMAL LOW (ref >60)
GFR calc non Af Amer: 38 mL/min — ABNORMAL LOW (ref >60)
Glucose, Bld: 137 mg/dL — ABNORMAL HIGH (ref 65–99)
POTASSIUM: 4.5 mmol/L (ref 3.5–5.1)
Sodium: 138 mmol/L (ref 135–145)
TOTAL PROTEIN: 6.2 g/dL — AB (ref 6.5–8.1)

## 2016-10-05 LAB — CBC
HEMATOCRIT: 37.5 % — AB (ref 39.0–52.0)
HEMOGLOBIN: 12.7 g/dL — AB (ref 13.0–17.0)
MCH: 30.6 pg (ref 26.0–34.0)
MCHC: 33.9 g/dL (ref 30.0–36.0)
MCV: 90.4 fL (ref 78.0–100.0)
Platelets: 79 10*3/uL — ABNORMAL LOW (ref 150–400)
RBC: 4.15 MIL/uL — ABNORMAL LOW (ref 4.22–5.81)
RDW: 13.3 % (ref 11.5–15.5)
WBC: 4.3 10*3/uL (ref 4.0–10.5)

## 2016-10-05 LAB — GLUCOSE, CAPILLARY
GLUCOSE-CAPILLARY: 130 mg/dL — AB (ref 65–99)
GLUCOSE-CAPILLARY: 123 mg/dL — AB (ref 65–99)
Glucose-Capillary: 227 mg/dL — ABNORMAL HIGH (ref 65–99)
Glucose-Capillary: 131 mg/dL — ABNORMAL HIGH (ref 65–99)

## 2016-10-05 LAB — VALPROIC ACID LEVEL: Valproic Acid Lvl: 34 ug/mL — ABNORMAL LOW (ref 50.0–100.0)

## 2016-10-05 NOTE — Progress Notes (Signed)
PROGRESS NOTE    [name]  [id number] DOB: 1966-04-04 DOA: 09/07/2016 PCP: Beverly Sessions    Brief Narrative:  51 year old male presented with altered mental status and fall. Patient known to have PTSD, bipolar disorder, depression, anxiety, chronic kidney disease stage III, seizure disorder, hypertension, diabetes mellitus type 2. Progressive decline in cognition over last 12 months, with worsening confusion 24 hours prior to admission, multiple falls, has been noncompliant with his medications. On initial physical examination, he was confused and disoriented. Blood pressure 139/92, heart rate 87, respiratory 25, saturation 98%. Restless, moist mucous membranes, lungs clear to auscultation, heart S1-S2 present, abdomen soft, no lower extremity edema. Patient admitted to hospital working diagnosis acute metabolic encephalopathy. Patient was found to be psychotic, psychiatry has made modifications to his medical regimen, currently waiting for placement.    Assessment & Plan:   Principal Problem:   Dementia, early onset with advanced brain atrophy for age Active Problems:   Bipolar disorder ([place])   Anxiety state   Depression   Essential hypertension   Diabetes type 2, uncontrolled ([place])   Hypokalemia   Acute metabolic encephalopathy   HLD (hyperlipidemia)   PTSD (post-traumatic stress disorder)   Acute encephalopathy   CRI (chronic renal insufficiency), stage 3 (moderate)   Fall   Acute urinary retention   Acute lower UTI   History of seizures   Constipation   1. Metabolic encephalopathy with psycosis. Patient more calm and interactive, reports occasional hallucinations, will continue gabapentin, seroquel and trazodone, no agitation. As needed haldol. Patient waiting placement.   2. HTN. Will continue blood pressure control with as needed hydralzine. Systolic blood pressure 626.   3. T2DM. Will continue glucose cover and monitoring, tolerating po diet well, on glargine 10 units  and pre -meal aspart 8 units. Capillary glucose 182, 193, 130, 227.   4. CKD stage 3. Renal function with cr at 1,9 from 1,7. K at 4,5 and serum bicarbonate 27. Will continue to follow on renal panel and electrolytes, avoid hypotension or nephrotoxic medications.   5. Seizures. Stable with no decompensation, will continue divaloprex.     DVT prophylaxis: enoxaparin  Code Status: full   Family Communication: no family at bedside  Disposition Plan: Home   Consultants:     Procedures:   Antimicrobials:    Subjective: Patient feeling very weak and deconditioned, still having hallucinations, and difficulty ambulating, no nausea or vomiting, no chest pain or dyspnea.   Objective: Vitals:   10/04/16 0643 10/04/16 1503 10/04/16 2133 10/05/16 0503  BP: 122/87 113/78 110/74 (!) 94/48  Pulse: 73 92 82 62  Resp: 17 17 18 20   Temp: 97.5 F (36.4 C) 97.9 F (36.6 C) 98.1 F (36.7 C) 98.6 F (37 C)  TempSrc: Oral  Oral   SpO2: 97% 96% 98% 96%  Weight:      Height:        Intake/Output Summary (Last 24 hours) at 10/05/16 1220 Last data filed at 10/04/16 1504  Gross per 24 hour  Intake              240 ml  Output                0 ml  Net              240 ml   Filed Weights   09/10/16 1029 09/15/16 0517  Weight: 94.4 kg (208 lb 1.8 oz) 97.1 kg (214 lb 1.1 oz)    Examination:  General exam:  Not in pain or dyspnea E ENT: no pallor or icterus.  Respiratory system: Clear to auscultation. Respiratory effort normal. No wheezing or rales.  Cardiovascular system: S1 & S2 heard, RRR. No JVD, murmurs, rubs, gallops or clicks. No pedal edema. Gastrointestinal system: Abdomen is nondistended, soft and nontender. No organomegaly or masses felt. Normal bowel sounds heard. Central nervous system: Alert and oriented. No focal neurological deficits. Extremities: Symmetric 5 x 5 power. Skin: No rashes, lesions or ulcers     Data Reviewed: I have personally reviewed following labs and  imaging studies  CBC:  Recent Labs Lab 10/05/16 0616  WBC 4.3  HGB 12.7*  HCT 37.5*  MCV 90.4  PLT 79*   Basic Metabolic Panel:  Recent Labs Lab 09/29/16 0356 09/30/16 0352 10/05/16 0616  NA 137 138 138  K 4.2 4.1 4.5  CL 105 102 104  CO2 26 28 27   GLUCOSE 190* 143* 137*  BUN 17 15 16   CREATININE 1.84* 1.71* 1.98*  CALCIUM 8.7* 8.6* 8.9   GFR: Estimated Creatinine Clearance: 56 mL/min (A) (by C-G formula based on SCr of 1.98 mg/dL (H)). Liver Function Tests:  Recent Labs Lab 09/29/16 0356 10/05/16 0616  AST 12* 13*  ALT 8* 9*  ALKPHOS 65 66  BILITOT 0.4 0.4  PROT 6.0* 6.2*  ALBUMIN 2.9* 3.1*   No results for input(s): LIPASE, AMYLASE in the last 168 hours. No results for input(s): AMMONIA in the last 168 hours. Coagulation Profile: No results for input(s): INR, PROTIME in the last 168 hours. Cardiac Enzymes: No results for input(s): CKTOTAL, CKMB, CKMBINDEX, TROPONINI in the last 168 hours. BNP (last 3 results) No results for input(s): PROBNP in the last 8760 hours. HbA1C: No results for input(s): HGBA1C in the last 72 hours. CBG:  Recent Labs Lab 10/04/16 1221 10/04/16 1728 10/04/16 2130 10/05/16 0804 10/05/16 1214  GLUCAP 194* 182* 193* 130* 227*   Lipid Profile: No results for input(s): CHOL, HDL, LDLCALC, TRIG, CHOLHDL, LDLDIRECT in the last 72 hours. Thyroid Function Tests: No results for input(s): TSH, T4TOTAL, FREET4, T3FREE, THYROIDAB in the last 72 hours. Anemia Panel: No results for input(s): VITAMINB12, FOLATE, FERRITIN, TIBC, IRON, RETICCTPCT in the last 72 hours. Sepsis Labs: No results for input(s): PROCALCITON, LATICACIDVEN in the last 168 hours.  No results found for this or any previous visit (from the past 240 hour(s)).       Radiology Studies: No results found.      Scheduled Meds: . divalproex  1,500 mg Oral QHS  . docusate sodium  100 mg Oral BID  . enoxaparin (LOVENOX) injection  40 mg Subcutaneous Q24H    . folic acid  1 mg Oral Daily  . gabapentin  300 mg Oral QHS  . insulin aspart  0-15 Units Subcutaneous TID WC  . insulin aspart  0-5 Units Subcutaneous QHS  . insulin aspart  8 Units Subcutaneous TID WC  . insulin glargine  10 Units Subcutaneous QHS  . polyethylene glycol  17 g Oral BID  . potassium chloride  20 mEq Oral Daily  . QUEtiapine  200 mg Oral QHS  . sodium chloride flush  3 mL Intravenous Q12H  . tamsulosin  0.4 mg Oral Daily  . testosterone  5 g Transdermal Daily  . traZODone  100 mg Oral QHS   Continuous Infusions:   LOS: 28 days        Izac Faulkenberry Gerome Apley, MD [facility] Pager [phone]  If 7PM-7AM, please contact night-coverage [email or web address]  Password [email or web address] 10/05/2016, 12:20 PM

## 2016-10-06 LAB — CBC
HCT: 37.1 % — ABNORMAL LOW (ref 39.0–52.0)
Hemoglobin: 12.3 g/dL — ABNORMAL LOW (ref 13.0–17.0)
MCH: 29.6 pg (ref 26.0–34.0)
MCHC: 33.2 g/dL (ref 30.0–36.0)
MCV: 89.4 fL (ref 78.0–100.0)
PLATELETS: 76 10*3/uL — AB (ref 150–400)
RBC: 4.15 MIL/uL — ABNORMAL LOW (ref 4.22–5.81)
RDW: 13.3 % (ref 11.5–15.5)
WBC: 4.2 10*3/uL (ref 4.0–10.5)

## 2016-10-06 LAB — GLUCOSE, CAPILLARY
GLUCOSE-CAPILLARY: 156 mg/dL — AB (ref 65–99)
Glucose-Capillary: 111 mg/dL — ABNORMAL HIGH (ref 65–99)
Glucose-Capillary: 87 mg/dL (ref 65–99)
Glucose-Capillary: 214 mg/dL — ABNORMAL HIGH (ref 65–99)

## 2016-10-06 LAB — BASIC METABOLIC PANEL
Anion gap: 9 (ref 5–15)
BUN: 20 mg/dL (ref 6–20)
CO2: 25 mmol/L (ref 22–32)
Calcium: 8.7 mg/dL — ABNORMAL LOW (ref 8.9–10.3)
Chloride: 103 mmol/L (ref 101–111)
Creatinine, Ser: 1.99 mg/dL — ABNORMAL HIGH (ref 0.61–1.24)
GFR calc Af Amer: 44 mL/min — ABNORMAL LOW (ref >60)
GFR, EST NON AFRICAN AMERICAN: 38 mL/min — AB (ref >60)
GLUCOSE: 116 mg/dL — AB (ref 65–99)
POTASSIUM: 4.5 mmol/L (ref 3.5–5.1)
Sodium: 137 mmol/L (ref 135–145)

## 2016-10-06 NOTE — Progress Notes (Signed)
PROGRESS NOTE   [name]  ZOX:[id number]    DOB: 02-01-1966    DOA: 09/07/2016  PCP: Beverly Sessions   I have briefly reviewed patients previous medical records in [facility].  Brief Narrative:  51 year old male with PMH of PTSD, bipolar disorder, ADD, depression, anxiety, stage III chronic kidney disease, seizures and DM 2 who was admitted 09/07/16 for evaluation of altered mental status and fall. He was noted to be noncompliant with his medications and to have been declining over the past year per report from his sister. Initial lab work unremarkable other than elevated ammonia level and CT scan of the head showed no evidence of acute intracranial abnormalities. He was noted to be restless and possibly psychotic on admission.He has stabilized. Therapies evaluation ongoing and recommend ALF. Difficult placement. Social worker following.   Assessment & Plan:   Principal Problem:   Dementia, early onset with advanced brain atrophy for age Active Problems:   Bipolar disorder ([place])   Anxiety state   Depression   Essential hypertension   Diabetes type 2, uncontrolled ([place])   Hypokalemia   Acute metabolic encephalopathy   HLD (hyperlipidemia)   PTSD (post-traumatic stress disorder)   Acute encephalopathy   CRI (chronic renal insufficiency), stage 3 (moderate)   Fall   Acute urinary retention   Acute lower UTI   History of seizures   Constipation   1. Early onset dementia with advanced brain atrophy for age/acute metabolic encephalopathy: Initial workup revealed an elevated ammonia level but folate, B12 and TSH levels were normal. CT head was unremarkable for acute findings. MRI brain also negative for acute findings but did show advanced brain atrophy. Repeat ammonia level normal at 22. He continued to have intermittent agitation/confusion. It is suspected that he has early onset dementia given significant brain atrophy for age and felt unlikely that he will be able to live in an  unsupervised environment and his sister is unable to provide him with 24-hour supervision. Psychiatry has seen him multiple times during this admission. As per their last follow-up on 09/22/16, patient stable on current medication regimen without acute symptoms of depression, anxiety, mania or psychosis, denied suicidal or homicidal ideations, intent or plans but continues to be somewhat confused, forgetful which seems chronic in nature. Psychiatry recommended continuing Seroquel 200 MG HS for mood swings, trazodone 100 MG HS for insomnia, Depakote 1500 MG HS for mood swings/bipolar disorder, Haldol 5 MG every 6 hours when necessary for agitation and combative behaviors, Ativan was discontinued, periodically monitor EKG for QTC prolongation (QTC 432 ms by EKG on 6/11) and valproic acid levels (34 on 10/05/16). Per psychiatry, he did not meet criteria for acute psychiatric admission. Follow EKG on 6/19. Stable. 2. Psychiatric illness including bipolar disorder, depression, anxiety & PTSD: Continue management as indicated in problem #1 as per psychiatry recommendations. Currently stable without agitation.  3. Essential hypertension: Controlled on no medications currently. Amlodipine and propranolol were discontinued due to borderline low blood pressures.  4. DM type II, uncontrolled: Poorly controlled. A1c 9.4 on 06/02/16. Noncompliant with most aspects of diabetes care at home. Added Lantus 10 units at bedtime on 6/10. Patient refuses diabetic diet and insists on regular diet-changed on 6/11. Change sliding scale to moderate sensitivity and increase mealtime NovoLog to 8 units. Reasonably controlled. 5. Hypokalemia: Resolved. 6. Seizure disorder: Valproic acid level 34. No reports of recurrent seizures.? Need to increase valproic dose. 7. Stage III chronic kidney disease: Baseline creatinine appears to be in the  2.2-2.7 range. Euvolemic clinically. Creatinine stable in the 1.9 drainage over the last 2  days. 8. Falls: History of frequent falls. Evaluated by neurology who recommended MRI brain and spine and findings of that were unremarkable for cause. Truvada discontinued in case it was contributing to his neuropathy. Neurology recommended outpatient follow-up for EMG/neuropsychiatric testing as well as SPEP testing. PT evaluation 6/15 >recommends ALF. Social worker exploring options. 9. Acute urinary retention: Noted on 09/09/16. Flomax started 5/25 and urinary retention resolved. 10. Escherichia coli Acute lower UTI: Felt to be CAUTI related to recent Foley catheterization for UTI. Completed course of ceftriaxone. Treated. 11. HIV screening: Nonreactive 12. Chronic thrombocytopenia: Low platelet count noted dating back to 2017. Platelet count had normalized but again dropped to the 70s but stable over the last 48 hours. Follow CBCs. 13. Constipation/hemorrhoids: reports constipation, rectal pain without bleeding. Bowel regimen started. Improved.   DVT prophylaxis: Lovenox Code Status: Full Family Communication: None at bedside Disposition: Awaiting placement, likely to ALF now.   Consultants:  Psychiatry  Neurology  Procedures:  Foley catheter-discontinued  Antimicrobials:  Completed course of ceftriaxone 3 days    Subjective: Showed a chronic small bump on his left lower occipital scalp which she has had for years without acute findings (? Sebaceous cyst approximately 0.25 cm diameter without acute findings). Having BM without pain or bleeding.  ROS: Denies dizziness, lightheadedness or pain elsewhere.  Objective:  Vitals:   10/05/16 2157 10/06/16 0504 10/06/16 0648 10/06/16 1634  BP: 97/61 (!) 89/54 (!) 108/57 (!) 118/54  Pulse: 73 (!) 59 (!) 56 81  Resp: 18 18  17   Temp: 99.2 F (37.3 C) 97.2 F (36.2 C)  97.6 F (36.4 C)  TempSrc:    Oral  SpO2: 99% 98%    Weight:      Height:        Examination:   General exam: Seen ambulating comfortably to his room  door. Respiratory system: Clear to auscultation. Respiratory effort normal. Stable Cardiovascular system: S1 & S2 heard, RRR. No JVD, murmurs, rubs, gallops or clicks. No pedal edema. Stable without change. Gastrointestinal system: Abdomen is nondistended, soft and nontender. No organomegaly or masses felt. Normal bowel sounds heard. Stable Central nervous system: Alert and oriented. No focal neurological deficits. Stable without change Extremities: Symmetric 5 x 5 power. Skin: No rashes, lesions or ulcers. No acute findings noted on feet exam today. Psychiatry: Judgement and insight appear normal. Mood & affect pleasant and interactive.     Data Reviewed: I have personally reviewed following labs and imaging studies  CBC:  Recent Labs Lab 10/05/16 0616 10/06/16 0731  WBC 4.3 4.2  HGB 12.7* 12.3*  HCT 37.5* 37.1*  MCV 90.4 89.4  PLT 79* 76*   Basic Metabolic Panel:  Recent Labs Lab 09/30/16 0352 10/05/16 0616 10/06/16 0633  NA 138 138 137  K 4.1 4.5 4.5  CL 102 104 103  CO2 28 27 25   GLUCOSE 143* 137* 116*  BUN 15 16 20   CREATININE 1.71* 1.98* 1.99*  CALCIUM 8.6* 8.9 8.7*   Liver Function Tests:  Recent Labs Lab 10/05/16 0616  AST 13*  ALT 9*  ALKPHOS 66  BILITOT 0.4  PROT 6.2*  ALBUMIN 3.1*   CBG:  Recent Labs Lab 10/05/16 1740 10/05/16 2158 10/06/16 0749 10/06/16 1235 10/06/16 1708  GLUCAP 131* 123* 111* 156* 87         Radiology Studies: No results found.      Scheduled Meds: . divalproex  1,500 mg Oral QHS  . docusate sodium  100 mg Oral BID  . enoxaparin (LOVENOX) injection  40 mg Subcutaneous Q24H  . folic acid  1 mg Oral Daily  . gabapentin  300 mg Oral QHS  . insulin aspart  0-15 Units Subcutaneous TID WC  . insulin aspart  0-5 Units Subcutaneous QHS  . insulin aspart  8 Units Subcutaneous TID WC  . insulin glargine  10 Units Subcutaneous QHS  . polyethylene glycol  17 g Oral BID  . potassium chloride  20 mEq Oral Daily   . QUEtiapine  200 mg Oral QHS  . sodium chloride flush  3 mL Intravenous Q12H  . tamsulosin  0.4 mg Oral Daily  . testosterone  5 g Transdermal Daily  . traZODone  100 mg Oral QHS   Continuous Infusions:   LOS: 67 days     Ashlin Kreps, MD, FACP, FHM. [facility] Pager [phone]  If 7PM-7AM, please contact night-coverage [email or web address] Password [facility] 10/06/2016, 8:04 PM

## 2016-10-06 NOTE — Progress Notes (Signed)
Physical Therapy Treatment Patient Details Name: [name] MRN: [id number] DOB: December 02, 1965 Today's Date: 10/06/2016    History of Present Illness 51 y.o.malewith history significant of PTSD, bipolar disorder, ADD, depression, anxiety, GERD, CKD stage III, seizure d/o, HTN, HLD, and DM who presentedwith altered mental status. The patient's sister reported he had been cognitively declining for the past year w/ multiple falls at home. Non - compliant w/ his medications per family, more confused and restless, reporting auditory and visual hallucinations. CT head in the ED was w/o acute findings.     PT Comments    Pt able to do 2 trials of stairs without sitting rest break today.  Pt needing review of supine therex, as he was saying he could not get on the floor to do them.  Educated on performing in bed.     Follow Up Recommendations  Home health PT;Supervision - Intermittent     Equipment Recommendations  None recommended by PT    Recommendations for Other Services       Precautions / Restrictions Precautions Precautions: Fall Restrictions Weight Bearing Restrictions: No    Mobility  Bed Mobility Overal bed mobility: Modified Independent Bed Mobility: Supine to Sit     Supine to sit: Modified independent (Device/Increase time)        Transfers Overall transfer level: Modified independent Equipment used: Rolling walker (2 wheeled) Transfers: Sit to/from Stand Sit to Stand: Modified independent (Device/Increase time)            Ambulation/Gait Ambulation/Gait assistance: Min guard;Supervision Ambulation Distance (Feet): 200 Feet Assistive device: Rolling walker (2 wheeled) Gait Pattern/deviations: Step-through pattern;Narrow base of support Gait velocity: decreased   General Gait Details: less scissoring noted today   Stairs Stairs: Yes   Stair Management: One rail Right;One rail Left;Two rails;Alternating pattern;Step to pattern;Forwards Number of  Stairs: 15 (x2) General stair comments: Pt did 1st set with step to pattern with1 step and 2nd set with step through with 2 rails  Wheelchair Mobility    Modified Rankin (Stroke Patients Only)       Balance                                            Cognition Arousal/Alertness: Awake/alert Behavior During Therapy: WFL for tasks assessed/performed Overall Cognitive Status: Impaired/Different from baseline Area of Impairment: Memory                 Orientation Level: Disoriented to;Time Current Attention Level: Sustained Memory: Decreased short-term memory Following Commands: Follows one step commands with increased time     Problem Solving: Slow processing;Requires verbal cues General Comments: several episodes of decreased memory during session      Exercises Total Joint Exercises Bridges: Strengthening;10 reps;Supine General Exercises - Lower Extremity Heel Slides: AROM;Both;10 reps;Supine Straight Leg Raises: Strengthening;Both;10 reps;Supine    General Comments        Pertinent Vitals/Pain Faces Pain Scale: Hurts even more Pain Location: B hips Pain Descriptors / Indicators: Grimacing Pain Intervention(s): Limited activity within patient's tolerance;Monitored during session;Repositioned    Home Living                      Prior Function            PT Goals (current goals can now be found in the care plan section) Acute Rehab PT Goals Patient Stated Goal:  To be independent with his mobility.   PT Goal Formulation: Patient unable to participate in goal setting Time For Goal Achievement: 10/13/16 Potential to Achieve Goals: Good Progress towards PT goals: Progressing toward goals    Frequency    Min 3X/week      PT Plan Current plan remains appropriate    Co-evaluation              AM-PAC PT "6 Clicks" Daily Activity  Outcome Measure  Difficulty turning over in bed (including adjusting bedclothes,  sheets and blankets)?: None Difficulty moving from lying on back to sitting on the side of the bed? : None Difficulty sitting down on and standing up from a chair with arms (e.g., wheelchair, bedside commode, etc,.)?: None Help needed moving to and from a bed to chair (including a wheelchair)?: None Help needed walking in hospital room?: A Little Help needed climbing 3-5 steps with a railing? : A Little 6 Click Score: 22    End of Session Equipment Utilized During Treatment: Gait belt Activity Tolerance: Patient tolerated treatment well Patient left: in chair;with call bell/phone within reach Nurse Communication: Mobility status PT Visit Diagnosis: Repeated falls (R29.6);Other abnormalities of gait and mobility (R26.89)     Time: 3557-3220 PT Time Calculation (min) (ACUTE ONLY): 24 min  Charges:  $Gait Training: 8-22 mins $Therapeutic Exercise: 8-22 mins                    G Codes:       [name], [place] Pager [phone] 10/06/2016    [name] 10/06/2016, 12:38 PM

## 2016-10-07 DIAGNOSIS — F0391 Unspecified dementia with behavioral disturbance: Secondary | ICD-10-CM

## 2016-10-07 LAB — BASIC METABOLIC PANEL
ANION GAP: 7 (ref 5–15)
BUN: 18 mg/dL (ref 6–20)
CALCIUM: 8.9 mg/dL (ref 8.9–10.3)
CHLORIDE: 101 mmol/L (ref 101–111)
CO2: 29 mmol/L (ref 22–32)
Creatinine, Ser: 1.92 mg/dL — ABNORMAL HIGH (ref 0.61–1.24)
GFR calc non Af Amer: 40 mL/min — ABNORMAL LOW (ref >60)
GFR, EST AFRICAN AMERICAN: 46 mL/min — AB (ref >60)
Glucose, Bld: 184 mg/dL — ABNORMAL HIGH (ref 65–99)
Potassium: 4.3 mmol/L (ref 3.5–5.1)
Sodium: 137 mmol/L (ref 135–145)

## 2016-10-07 LAB — GLUCOSE, CAPILLARY
GLUCOSE-CAPILLARY: 97 mg/dL (ref 65–99)
GLUCOSE-CAPILLARY: 105 mg/dL — AB (ref 65–99)
GLUCOSE-CAPILLARY: 164 mg/dL — AB (ref 65–99)
Glucose-Capillary: 158 mg/dL — ABNORMAL HIGH (ref 65–99)
Glucose-Capillary: 194 mg/dL — ABNORMAL HIGH (ref 65–99)

## 2016-10-07 LAB — CBC
HCT: 36.4 % — ABNORMAL LOW (ref 39.0–52.0)
HEMOGLOBIN: 12.2 g/dL — AB (ref 13.0–17.0)
MCH: 29.9 pg (ref 26.0–34.0)
MCHC: 33.5 g/dL (ref 30.0–36.0)
MCV: 89.2 fL (ref 78.0–100.0)
Platelets: 92 10*3/uL — ABNORMAL LOW (ref 150–400)
RBC: 4.08 MIL/uL — AB (ref 4.22–5.81)
RDW: 13.2 % (ref 11.5–15.5)
WBC: 3.6 10*3/uL — ABNORMAL LOW (ref 4.0–10.5)

## 2016-10-07 NOTE — Progress Notes (Addendum)
Physical Therapy Treatment Patient Details Name: [name] MRN: [id number] DOB: [birth year] Today's Date: 10/07/2016    History of Present Illness 51 y.o.malewith history significant of PTSD, bipolar disorder, ADD, depression, anxiety, GERD, CKD stage III, seizure d/o, HTN, HLD, and DM who presentedwith altered mental status. The patient's sister reported he had been cognitively declining for the past year w/ multiple falls at home. Non - compliant w/ his medications per family, more confused and restless, reporting auditory and visual hallucinations. CT head in the ED was w/o acute findings.     PT Comments    Pt limited by pain this session. Experiencing pain in bilat feet and hips which limited ambulation distance; pt requiring min guard with use of RW. Pt reports RN applied bed alarm. Required extensive education about purpose of bed alarm and importance of calling staff before getting up, however, pt stating he would just get up anyways. Bed alarm reapplied at end of session. Current plan remains appropriate. Will continue to follow acutely.    Follow Up Recommendations  Home health PT;Supervision - Intermittent @ ALF to assist with medication management and ambulation as needed     Equipment Recommendations  None recommended by PT    Recommendations for Other Services       Precautions / Restrictions Precautions Precautions: Fall Restrictions Weight Bearing Restrictions: No    Mobility  Bed Mobility Overal bed mobility: Modified Independent             General bed mobility comments: Requiring increased time secondary to pain, however, no assist requred  Transfers Overall transfer level: Modified independent Equipment used: Rolling walker (2 wheeled)             General transfer comment: increased time secondary to pain, but no assist required.   Ambulation/Gait Ambulation/Gait assistance: Min guard Ambulation Distance (Feet): 150 Feet Assistive  device: Rolling walker (2 wheeled) Gait Pattern/deviations: Step-through pattern;Shuffle;Narrow base of support Gait velocity: decreased Gait velocity interpretation: Below normal speed for age/gender General Gait Details: Pt with BLE shuffling secondary to reports of increased pain, and required safety cues for foot clearance. Min guard required secondary to mild unsteadiness because of pain.    Stairs            Wheelchair Mobility    Modified Rankin (Stroke Patients Only)       Balance Overall balance assessment: Needs assistance Sitting-balance support: No upper extremity supported;Feet supported Sitting balance-Leahy Scale: Good     Standing balance support: Bilateral upper extremity supported;During functional activity Standing balance-Leahy Scale: Poor Standing balance comment: Reliant on RW for support                             Cognition Arousal/Alertness: Awake/alert Behavior During Therapy: WFL for tasks assessed/performed Overall Cognitive Status: Impaired/Different from baseline Area of Impairment: Memory;Safety/judgement                     Memory: Decreased short-term memory;Decreased recall of precautions Following Commands: Follows one step commands with increased time Safety/Judgement: Decreased awareness of safety;Decreased awareness of deficits     General Comments: Pt reporting issues with remembering things. Pt reporting they reapplied the bed alarm, but that he was going to get up anyways. Educated about importance of calling for help before getting up to prevent falls.       Exercises General Exercises - Lower Extremity Hip Flexion/Marching: AROM;Both;10 reps;Seated (limited by pain )  Toe Raises: AROM;Both;10 reps;Seated Heel Raises: AROM;Both;10 reps;Seated    General Comments General comments (skin integrity, edema, etc.): Required continuous education about purpose of bed alarm and importance of calling staff when  getting up.       Pertinent Vitals/Pain Pain Assessment: 0-10 Pain Score: 7  Pain Location: B LE, especially feet and hips  Pain Descriptors / Indicators: Grimacing;Aching Pain Intervention(s): Limited activity within patient's tolerance;Monitored during session;Repositioned    Home Living                      Prior Function            PT Goals (current goals can now be found in the care plan section) Acute Rehab PT Goals Patient Stated Goal: To be independent with his mobility.   PT Goal Formulation: Patient unable to participate in goal setting Time For Goal Achievement: 10/13/16 Potential to Achieve Goals: Good Progress towards PT goals: Progressing toward goals    Frequency    Min 3X/week      PT Plan Current plan remains appropriate    Co-evaluation              AM-PAC PT "6 Clicks" Daily Activity  Outcome Measure  Difficulty turning over in bed (including adjusting bedclothes, sheets and blankets)?: None Difficulty moving from lying on back to sitting on the side of the bed? : None Difficulty sitting down on and standing up from a chair with arms (e.g., wheelchair, bedside commode, etc,.)?: None Help needed moving to and from a bed to chair (including a wheelchair)?: A Little Help needed walking in hospital room?: A Little Help needed climbing 3-5 steps with a railing? : A Little 6 Click Score: 21    End of Session Equipment Utilized During Treatment: Gait belt Activity Tolerance: Patient limited by pain Patient left: in bed;with call bell/phone within reach;with bed alarm set Nurse Communication: Mobility status PT Visit Diagnosis: Repeated falls (R29.6);Other abnormalities of gait and mobility (R26.89)     Time: 1155-2080 PT Time Calculation (min) (ACUTE ONLY): 20 min  Charges:  $Gait Training: 8-22 mins                    G Codes:       Leighton Ruff, PT, DPT  Acute Rehabilitation Services  Pager: [phone]    Rudean Hitt 10/07/2016, 6:48 PM

## 2016-10-07 NOTE — Progress Notes (Signed)
PROGRESS NOTE   [name]  PQD:[id number]    DOB: [birth year]    DOA: 09/07/2016  PCP: Beverly Sessions   I have briefly reviewed patients previous medical records in [facility].  Brief Narrative:  51 year old male with PMH of PTSD, bipolar disorder, ADD, depression, anxiety, stage III chronic kidney disease, seizures and DM 2 who was admitted 09/07/16 for evaluation of altered mental status and fall. He was noted to be noncompliant with his medications and to have been declining over the past year per report from his sister. Initial lab work unremarkable other than elevated ammonia level and CT scan of the head showed no evidence of acute intracranial abnormalities. He was noted to be restless and possibly psychotic on admission.He has stabilized. Therapies evaluation ongoing and recommend ALF. Difficult placement. Social worker following.   Assessment & Plan:   Principal Problem:   Dementia, early onset with advanced brain atrophy for age Active Problems:   Bipolar disorder ([place])   Anxiety state   Depression   Essential hypertension   Diabetes type 2, uncontrolled ([place])   Hypokalemia   Acute metabolic encephalopathy   HLD (hyperlipidemia)   PTSD (post-traumatic stress disorder)   Acute encephalopathy   CRI (chronic renal insufficiency), stage 3 (moderate)   Fall   Acute urinary retention   Acute lower UTI   History of seizures   Constipation   1. Early onset dementia with advanced brain atrophy for age/acute metabolic encephalopathy: Initial workup revealed an elevated ammonia level but folate, B12 and TSH levels were normal. CT head was unremarkable for acute findings. MRI brain also negative for acute findings but did show advanced brain atrophy. Repeat ammonia level normal at 22. He continued to have intermittent agitation/confusion. It is suspected that he has early onset dementia given significant brain atrophy for age and felt unlikely that he will be able to live in an  unsupervised environment and his sister is unable to provide him with 24-hour supervision. Psychiatry has seen him multiple times during this admission. As per their last follow-up on 09/22/16, patient stable on current medication regimen without acute symptoms of depression, anxiety, mania or psychosis, denied suicidal or homicidal ideations, intent or plans but continues to be somewhat confused, forgetful which seems chronic in nature. Psychiatry recommended continuing Seroquel 200 MG HS for mood swings, trazodone 100 MG HS for insomnia, Depakote 1500 MG HS for mood swings/bipolar disorder, Haldol 5 MG every 6 hours when necessary for agitation and combative behaviors, Ativan was discontinued, periodically monitor EKG for QTC prolongation (QTC 432 ms by EKG on 6/11) and valproic acid levels (34 on 10/05/16). Per psychiatry, he did not meet criteria for acute psychiatric admission.  2. Psychiatric illness including bipolar disorder, depression, anxiety & PTSD: Continue management as indicated in problem #1 as per psychiatry recommendations. Currently stable without agitation.  3. Essential hypertension: Controlled on no medications currently. Amlodipine and propranolol were discontinued due to borderline low blood pressures.  4. DM type II, uncontrolled: Poorly controlled. A1c 9.4 on 06/02/16. Noncompliant with most aspects of diabetes care at home. Added Lantus 10 units at bedtime on 6/10. Patient refuses diabetic diet and insists on regular diet-changed on 6/11. Change sliding scale to moderate sensitivity and increase mealtime NovoLog to 8 units. Reasonably controlled. 5. Hypokalemia: Resolved. 6. Seizure disorder: Valproic acid level 34. No reports of recurrent seizures. 7. Stage III chronic kidney disease: Baseline creatinine appears to be in the 2.2-2.7 range. Euvolemic clinically. Creatinine stable 1.9 today 8.  Falls: History of frequent falls. Evaluated by neurology who recommended MRI brain and spine  and findings of that were unremarkable for cause. Truvada discontinued in case it was contributing to his neuropathy. Neurology recommended outpatient follow-up for EMG/neuropsychiatric testing as well as SPEP testing. PT evaluation 6/15 >recommends ALF. Social worker exploring options. 9. Acute urinary retention: Noted on 09/09/16. Flomax started 5/25 and urinary retention resolved. 10. Escherichia coli Acute lower UTI: Felt to be CAUTI related to recent Foley catheterization for UTI. Completed course of ceftriaxone. Treated. 11. HIV screening: Nonreactive 12. Chronic thrombocytopenia: Low platelet count noted dating back to 2017. Platelet count had normalized but again dropped to the 70s but stable over the last 48 hours, rechecked is 92 today 13. Constipation/hemorrhoids: reports constipation, rectal pain without bleeding. Bowel regimen started. Improved.   DVT prophylaxis: Lovenox Code Status: Full Family Communication: None at bedside Disposition: Awaiting placement, likely to ALF now.   Consultants:  Psychiatry  Neurology  Procedures:  Foley catheter-discontinued  Antimicrobials:  Completed course of ceftriaxone 3 days    Subjective: Patient denies chest pain, dizziness, lightheadedness, no dyspnea, reports he is able to ambulate with a walker .   ROS: Denies dizziness, lightheadedness or pain elsewhere.  Objective:  Vitals:   10/06/16 0648 10/06/16 1634 10/06/16 2154 10/07/16 0614  BP: (!) 108/57 (!) 118/54 (!) 110/57 109/69  Pulse: (!) 56 81 68 63  Resp:  17 20 16   Temp:  97.6 F (36.4 C) 97.8 F (36.6 C) 97.8 F (36.6 C)  TempSrc:  Oral    SpO2:   98% 99%  Weight:      Height:        Examination:   General exam: Seen ambulating comfortably to his room door. Respiratory system: Clear to auscultation. Respiratory effort normal. Stable Cardiovascular system: S1 & S2 heard, RRR. No JVD, murmurs, rubs, gallops or clicks. No pedal edema. Stable without change    Gastrointestinal system: Abdomen is nondistended, soft and nontender. No organomegaly or masses felt. Normal bowel sounds heard. Stable Central nervous system: Alert and oriented. No focal neurological deficits. Stable Extremities: Symmetric 5 x 5 power. Skin: No rashes, lesions or ulcers. No acute findings noted on feet exam today. Psychiatry: Judgement and insight appear normal. Mood & affect pleasant and interactive.     Data Reviewed: I have personally reviewed following labs and imaging studies  CBC:  Recent Labs Lab 10/05/16 0616 10/06/16 0731 10/07/16 0602  WBC 4.3 4.2 3.6*  HGB 12.7* 12.3* 12.2*  HCT 37.5* 37.1* 36.4*  MCV 90.4 89.4 89.2  PLT 79* 76* 92*   Basic Metabolic Panel:  Recent Labs Lab 10/05/16 0616 10/06/16 0633 10/07/16 0602  NA 138 137 137  K 4.5 4.5 4.3  CL 104 103 101  CO2 27 25 29   GLUCOSE 137* 116* 184*  BUN 16 20 18   CREATININE 1.98* 1.99* 1.92*  CALCIUM 8.9 8.7* 8.9   Liver Function Tests:  Recent Labs Lab 10/05/16 0616  AST 13*  ALT 9*  ALKPHOS 66  BILITOT 0.4  PROT 6.2*  ALBUMIN 3.1*   CBG:  Recent Labs Lab 10/06/16 1235 10/06/16 1708 10/06/16 2152 10/07/16 0801 10/07/16 1208  GLUCAP 156* 87 214* 158* 194*         Radiology Studies: No results found.      Scheduled Meds: . divalproex  1,500 mg Oral QHS  . docusate sodium  100 mg Oral BID  . enoxaparin (LOVENOX) injection  40 mg Subcutaneous Q24H  .  folic acid  1 mg Oral Daily  . gabapentin  300 mg Oral QHS  . insulin aspart  0-15 Units Subcutaneous TID WC  . insulin aspart  0-5 Units Subcutaneous QHS  . insulin aspart  8 Units Subcutaneous TID WC  . insulin glargine  10 Units Subcutaneous QHS  . polyethylene glycol  17 g Oral BID  . potassium chloride  20 mEq Oral Daily  . QUEtiapine  200 mg Oral QHS  . sodium chloride flush  3 mL Intravenous Q12H  . tamsulosin  0.4 mg Oral Daily  . testosterone  5 g Transdermal Daily  . traZODone  100 mg Oral  QHS   Continuous Infusions:   LOS: 30 days     Shandricka Monroy, MD,  [facility] Pager [phone]  If 7PM-7AM, please contact night-coverage [email or web address] Password [email or web address] 10/07/2016, 1:31 PM

## 2016-10-08 DIAGNOSIS — G9340 Encephalopathy, unspecified: Secondary | ICD-10-CM

## 2016-10-08 LAB — GLUCOSE, CAPILLARY
GLUCOSE-CAPILLARY: 177 mg/dL — AB (ref 65–99)
Glucose-Capillary: 133 mg/dL — ABNORMAL HIGH (ref 65–99)
Glucose-Capillary: 139 mg/dL — ABNORMAL HIGH (ref 65–99)
Glucose-Capillary: 145 mg/dL — ABNORMAL HIGH (ref 65–99)

## 2016-10-08 MED ORDER — CYANOCOBALAMIN 1000 MCG/ML IJ SOLN
1000.0000 ug | INTRAMUSCULAR | Status: DC
  Administered 2016-10-11: 1000 ug via INTRAMUSCULAR
  Filled 2016-10-08: qty 1

## 2016-10-08 MED ORDER — CYCLOBENZAPRINE HCL 5 MG PO TABS
5.0000 mg | ORAL_TABLET | Freq: Three times a day (TID) | ORAL | Status: DC | PRN
  Administered 2016-10-08 – 2016-10-13 (×13): 5 mg via ORAL
  Filled 2016-10-08 (×14): qty 1

## 2016-10-08 MED ORDER — TRAMADOL HCL 50 MG PO TABS
50.0000 mg | ORAL_TABLET | Freq: Three times a day (TID) | ORAL | Status: DC
  Administered 2016-10-08 – 2016-10-09 (×4): 50 mg via ORAL
  Filled 2016-10-08 (×4): qty 1

## 2016-10-08 MED ORDER — CYANOCOBALAMIN 1000 MCG/ML IJ SOLN
1000.0000 ug | Freq: Every day | INTRAMUSCULAR | Status: AC
  Administered 2016-10-09: 1000 ug via INTRAMUSCULAR
  Filled 2016-10-08 (×3): qty 1

## 2016-10-08 NOTE — Progress Notes (Signed)
PROGRESS NOTE   [name]  CLE:[id number]    DOB: [birth year]    DOA: 09/07/2016  PCP: Beverly Sessions   I have briefly reviewed patients previous medical records in [facility].  Brief Narrative:  51 year old male with PMH of PTSD, bipolar disorder, ADD, depression, anxiety, stage III chronic kidney disease, seizures and DM 2 who was admitted 09/07/16 for evaluation of altered mental status and fall. He was noted to be noncompliant with his medications and to have been declining over the past year per report from his sister. Initial lab work unremarkable other than elevated ammonia level and CT scan of the head showed no evidence of acute intracranial abnormalities. He was noted to be restless and possibly psychotic on admission.He has stabilized. Therapies evaluation ongoing and recommend ALF. Difficult placement. Social worker following.   Assessment & Plan:   Principal Problem:   Dementia, early onset with advanced brain atrophy for age Active Problems:   Bipolar disorder ([place])   Anxiety state   Depression   Essential hypertension   Diabetes type 2, uncontrolled ([place])   Hypokalemia   Acute metabolic encephalopathy   HLD (hyperlipidemia)   PTSD (post-traumatic stress disorder)   Acute encephalopathy   CRI (chronic renal insufficiency), stage 3 (moderate)   Fall   Acute urinary retention   Acute lower UTI   History of seizures   Constipation  Early onset dementia with advanced brain atrophy for age/acute metabolic encephalopathy:  - Initial workup revealed an elevated ammonia level but folate, B12 and TSH levels were normal. CT head was unremarkable for acute findings. MRI brain also negative for acute findings but did show advanced brain atrophy. Repeat ammonia level normal at 22. He continued to have intermittent agitation/confusion. It is suspected that he has early onset dementia given significant brain atrophy for age and felt unlikely that he will be able to live in an  unsupervised environment and his sister is unable to provide him with 24-hour supervision. Psychiatry has seen him multiple times during this admission. As per their last follow-up on 09/22/16, patient stable on current medication regimen without acute symptoms of depression, anxiety, mania or psychosis, denied suicidal or homicidal ideations, intent or plans but continues to be somewhat confused, forgetful which seems chronic in nature. Psychiatry recommended continuing Seroquel 200 MG HS for mood swings, trazodone 100 MG HS for insomnia, Depakote 1500 MG HS for mood swings/bipolar disorder, Haldol 5 MG every 6 hours when necessary for agitation and combative behaviors, Ativan was discontinued, periodically monitor EKG for QTC prolongation (QTC 432 ms by EKG on 6/11) and valproic acid levels (34 on 10/05/16). Per psychiatry, he did not meet criteria for acute psychiatric admission.   Psychiatric illness including bipolar disorder, depression, anxiety & PTSD:  - Continue management as indicated in problem #1 as per psychiatry recommendations. Currently stable without agitation.   Essential hypertension:  - Controlled on no medications currently. Amlodipine and propranolol were discontinued due to borderline low blood pressures.   DM type II, uncontrolled:  - Poorly controlled. A1c 9.4 on 06/02/16. Noncompliant with most aspects of diabetes care at home. Added Lantus 10 units at bedtime on 6/10. Patient refuses diabetic diet and insists on regular diet-changed on 6/11. Change sliding scale to moderate sensitivity and increase mealtime NovoLog to 8 units. Reasonably controlled.  Hypokalemia:  - Resolved.  Seizure disorder:  - Valproic acid level 34. No reports of recurrent seizures.  Stage III chronic kidney disease: -  Baseline creatinine appears to  be in the 2.2-2.7 range. Euvolemic clinically. Creatinine stable 1.9 today  Falls:  - History of frequent falls. Evaluated by neurology who recommended  MRI brain and spine and findings of that were unremarkable for cause. Truvada discontinued in case it was contributing to his neuropathy. Neurology recommended outpatient follow-up for EMG/neuropsychiatric/neurocognitive testing . PT evaluation 6/15 >recommends ALF. Social worker exploring options.  Acute urinary retention:  - Noted on 09/09/16. Flomax started 5/25 and urinary retention resolved.  Escherichia coli Acute lower UTI:  - Felt to be CAUTI related to recent Foley catheterization for UTI. Completed course of ceriaxone. Treated.  HIV screening:  - Nonreactive  Chronic thrombocytopenia:  - Low platelet count noted dating back to 2017. Platelet count had normalized but again dropped to the 70s but stable over the last 48 hours, rechecked is 92 today  Constipation/hemorrhoids:  - reports constipation, rectal pain without bleeding. Bowel regimen started. Improved.   DVT prophylaxis: Lovenox Code Status: Full Family Communication: None at bedside Disposition: Awaiting placement, likely to ALF now.   Consultants:  Psychiatry  Neurology  Procedures:  Foley catheter-discontinued  Antimicrobials:  Completed course of ceftriaxone 3 days    Subjective: Patient denies any chest pain, dizziness, lightheadedness, but he does report he is having lower back pain .  Objective:  Vitals:   10/07/16 1431 10/07/16 2250 10/08/16 0514 10/08/16 0636  BP: 116/68 121/70 (!) 99/46 106/68  Pulse: 82 71 61   Resp: 16 16 16    Temp: 97.8 F (36.6 C) 98 F (36.7 C) 98.5 F (36.9 C)   TempSrc: Oral     SpO2: 98% 97% 97%   Weight:      Height:        Examination:   General exam: Sitting in bed, no apparent distress, reports some lower back pain today Respiratory system: Clear to auscultation. Respiratory effort normal. Stable Cardiovascular system: S1 & S2 heard, RRR. No JVD, murmurs, rubs, gallops or clicks. No pedal edema. Stable without change  Gastrointestinal system: Abdomen  is nondistended, soft and nontender. No organomegaly or masses felt. Normal bowel sounds heard. Stable Central nervous system: Alert and oriented. No focal neurological deficits. Stable Extremities: Symmetric 5 x 5 power. Skin: No rashes, lesions or ulcers. No acute findings noted on feet exam today. Psychiatry: Judgement and insight appear normal. Mood & affect pleasant and interactive.     Data Reviewed: I have personally reviewed following labs and imaging studies  CBC:  Recent Labs Lab 10/05/16 0616 10/06/16 0731 10/07/16 0602  WBC 4.3 4.2 3.6*  HGB 12.7* 12.3* 12.2*  HCT 37.5* 37.1* 36.4*  MCV 90.4 89.4 89.2  PLT 79* 76* 92*   Basic Metabolic Panel:  Recent Labs Lab 10/05/16 0616 10/06/16 0633 10/07/16 0602  NA 138 137 137  K 4.5 4.5 4.3  CL 104 103 101  CO2 27 25 29   GLUCOSE 137* 116* 184*  BUN 16 20 18   CREATININE 1.98* 1.99* 1.92*  CALCIUM 8.9 8.7* 8.9   Liver Function Tests:  Recent Labs Lab 10/05/16 0616  AST 13*  ALT 9*  ALKPHOS 66  BILITOT 0.4  PROT 6.2*  ALBUMIN 3.1*   CBG:  Recent Labs Lab 10/07/16 1650 10/07/16 1820 10/07/16 2248 10/08/16 0819 10/08/16 1230  GLUCAP 97 105* 164* 133* 139*         Radiology Studies: No results found.      Scheduled Meds: . cyanocobalamin  1,000 mcg Intramuscular Daily   Followed by  . [START ON  10/11/2016] cyanocobalamin  1,000 mcg Intramuscular Weekly  . divalproex  1,500 mg Oral QHS  . docusate sodium  100 mg Oral BID  . enoxaparin (LOVENOX) injection  40 mg Subcutaneous Q24H  . folic acid  1 mg Oral Daily  . gabapentin  300 mg Oral QHS  . insulin aspart  0-15 Units Subcutaneous TID WC  . insulin aspart  0-5 Units Subcutaneous QHS  . insulin aspart  8 Units Subcutaneous TID WC  . insulin glargine  10 Units Subcutaneous QHS  . polyethylene glycol  17 g Oral BID  . potassium chloride  20 mEq Oral Daily  . QUEtiapine  200 mg Oral QHS  . tamsulosin  0.4 mg Oral Daily  . testosterone   5 g Transdermal Daily  . traMADol  50 mg Oral Q8H  . traZODone  100 mg Oral QHS   Continuous Infusions:   LOS: 31 days     ELGERGAWY, DAWOOD, MD,  [facility] Pager [phone]  If 7PM-7AM, please contact night-coverage [email or web address] Password [facility] 10/08/2016, 12:58 PM

## 2016-10-08 NOTE — Progress Notes (Signed)
[facility] is working on a Mult[facility] programmer and now may be able to accept patient on their ALF side. Patient's SNF pasrr has been resubmitted as an ALF pasrr.   [name] Teala Daffron LCSWA [phone]

## 2016-10-08 NOTE — Progress Notes (Signed)
Nutrition Brief Note  RD pulled to chart due to LOS (31 days).   Wt Readings from Last 15 Encounters:  09/15/16 214 lb 1.1 oz (97.1 kg)  06/02/16 217 lb (98.4 kg)  03/31/16 210 lb (95.3 kg)  03/19/16 218 lb (98.9 kg)  01/29/16 202 lb (91.6 kg)  01/01/16 198 lb 13.7 oz (90.2 kg)  11/26/15 211 lb (95.7 kg)  10/12/15 205 lb (93 kg)  10/03/15 215 lb 13.3 oz (97.9 kg)  09/26/15 207 lb (93.9 kg)  08/16/15 208 lb (94.3 kg)  07/16/15 214 lb (97.1 kg)  07/09/15 213 lb (96.6 kg)  05/22/15 231 lb (104.8 kg)  05/11/15 231 lb 8 oz (3 kg)   51 year old male with PMH of PTSD, bipolar disorder, ADD, depression, anxiety, stage III chronic kidney disease, seizures and DM 2 who was admitted 09/07/16 for evaluation of altered mental status and fall. He was noted to be noncompliant with his medications and to have been declining over the past year per report from his sister. Initial lab work unremarkable other than elevated ammonia level and CT scan of the head showed no evidence of acute intracranial abnormalities. He was noted to be restless and possibly psychotic on admission.He has stabilized. Therapies evaluation ongoing and recommend ALF. Difficult placement.   Body mass index is 26.06 kg/m. Patient meets criteria for overweight based on current BMI.   Current diet order is regular, patient is consuming approximately 60-100% of meals at this time. Labs and medications reviewed.   No nutrition interventions warranted at this time. If nutrition issues arise, please consult RD.   Daisia Slomski A. Jimmye Norman, RD, LDN, CDE Pager: [phone] After hours Pager: [phone]

## 2016-10-09 DIAGNOSIS — E1165 Type 2 diabetes mellitus with hyperglycemia: Secondary | ICD-10-CM

## 2016-10-09 DIAGNOSIS — N183 Chronic kidney disease, stage 3 (moderate): Secondary | ICD-10-CM

## 2016-10-09 DIAGNOSIS — E1122 Type 2 diabetes mellitus with diabetic chronic kidney disease: Secondary | ICD-10-CM

## 2016-10-09 LAB — GLUCOSE, CAPILLARY
GLUCOSE-CAPILLARY: 158 mg/dL — AB (ref 65–99)
Glucose-Capillary: 134 mg/dL — ABNORMAL HIGH (ref 65–99)
Glucose-Capillary: 212 mg/dL — ABNORMAL HIGH (ref 65–99)
Glucose-Capillary: 172 mg/dL — ABNORMAL HIGH (ref 65–99)

## 2016-10-09 MED ORDER — INSULIN GLARGINE 100 UNIT/ML ~~LOC~~ SOLN
22.0000 [IU] | Freq: Every day | SUBCUTANEOUS | Status: DC
  Administered 2016-10-09 – 2016-10-12 (×4): 22 [IU] via SUBCUTANEOUS
  Filled 2016-10-09 (×5): qty 0.22

## 2016-10-09 MED ORDER — INSULIN GLARGINE 100 UNIT/ML ~~LOC~~ SOLN: 18.0000 [IU] | Freq: Every day | SUBCUTANEOUS | Status: DC

## 2016-10-09 MED ORDER — TRAMADOL HCL 50 MG PO TABS
50.0000 mg | ORAL_TABLET | Freq: Three times a day (TID) | ORAL | Status: DC | PRN
  Administered 2016-10-09 – 2016-10-13 (×10): 50 mg via ORAL
  Filled 2016-10-09 (×11): qty 1

## 2016-10-09 NOTE — Progress Notes (Signed)
Physical Therapy Treatment Patient Details Name: [name] MRN: [id number] DOB: 1965-07-21 Today's Date: 10/09/2016    History of Present Illness 51 y.o.malewith history significant of PTSD, bipolar disorder, ADD, depression, anxiety, GERD, CKD stage III, seizure d/o, HTN, HLD, and DM who presentedwith altered mental status. The patient's sister reported he had been cognitively declining for the past year w/ multiple falls at home. Non - compliant w/ his medications per family, more confused and restless, reporting auditory and visual hallucinations. CT head in the ED was w/o acute findings.     PT Comments    Patient tolerated increased gait and stair negotiation this session however with c/o pain in bilat LE and low back. Pt overall supervision/min guard assist for safety. Continue to progress as tolerated.   Follow Up Recommendations  Home health PT;Supervision - Intermittent @ ALF     Equipment Recommendations  None recommended by PT    Recommendations for Other Services       Precautions / Restrictions Precautions Precautions: Fall Restrictions Weight Bearing Restrictions: No    Mobility  Bed Mobility               General bed mobility comments: pt sitting EOB upon arrival  Transfers Overall transfer level: Modified independent Equipment used: Rolling walker (2 wheeled)             General transfer comment: increased time/effort  Ambulation/Gait Ambulation/Gait assistance: Supervision Ambulation Distance (Feet): 350 Feet Assistive device: Rolling walker (2 wheeled) Gait Pattern/deviations: Step-through pattern;Narrow base of support;Decreased stride length;Trunk flexed Gait velocity: decreased   General Gait Details: cues for posture and cadence; relies heavily on RW but able to improve back extension and decrease UE weight bearing with cues for short periods of time   Stairs Stairs: Yes   Stair Management: Two rails;Step to  pattern;Forwards Number of Stairs:  (flight) General stair comments: cues for sequencing as pt reported L knee pain > R and cues for step to pattern   Wheelchair Mobility    Modified Rankin (Stroke Patients Only)       Balance Overall balance assessment: Needs assistance Sitting-balance support: No upper extremity supported;Feet supported Sitting balance-Leahy Scale: Good     Standing balance support: Bilateral upper extremity supported;During functional activity Standing balance-Leahy Scale: Poor Standing balance comment: Reliant on RW for support                             Cognition Arousal/Alertness: Awake/alert Behavior During Therapy: WFL for tasks assessed/performed Overall Cognitive Status: Within Functional Limits for tasks assessed (no caregiver/family to determine baseline cognitive function)                                        Exercises      General Comments        Pertinent Vitals/Pain Pain Assessment: Faces Faces Pain Scale: Hurts little more Pain Location: Low back, hips, knees, and feet Pain Descriptors / Indicators: Grimacing;Aching Pain Intervention(s): Limited activity within patient's tolerance;Monitored during session;Premedicated before session;Repositioned    Home Living                      Prior Function            PT Goals (current goals can now be found in the care plan section) Acute Rehab PT Goals  PT Goal Formulation: Patient unable to participate in goal setting Time For Goal Achievement: 10/13/16 Potential to Achieve Goals: Good Progress towards PT goals: Progressing toward goals    Frequency    Min 3X/week      PT Plan Current plan remains appropriate    Co-evaluation              AM-PAC PT "6 Clicks" Daily Activity  Outcome Measure  Difficulty turning over in bed (including adjusting bedclothes, sheets and blankets)?: None Difficulty moving from lying on back to  sitting on the side of the bed? : None Difficulty sitting down on and standing up from a chair with arms (e.g., wheelchair, bedside commode, etc,.)?: None Help needed moving to and from a bed to chair (including a wheelchair)?: None Help needed walking in hospital room?: None Help needed climbing 3-5 steps with a railing? : A Little 6 Click Score: 23    End of Session Equipment Utilized During Treatment: Gait belt Activity Tolerance: Patient tolerated treatment well Patient left: in bed;with call bell/phone within reach;Other (comment) (pt sitting EOB) Nurse Communication: Mobility status PT Visit Diagnosis: Repeated falls (R29.6);Other abnormalities of gait and mobility (R26.89)     Time: 8032-1224 PT Time Calculation (min) (ACUTE ONLY): 28 min  Charges:  $Gait Training: 8-22 mins $Therapeutic Activity: 8-22 mins                    G Codes:       Earney Navy, PTA Pager: [phone]     Darliss Cheney 10/09/2016, 11:13 AM

## 2016-10-09 NOTE — Progress Notes (Signed)
PROGRESS NOTE   [name]  ZYS:[id number]    DOB: 1965-05-29    DOA: 09/07/2016  PCP: Beverly Sessions   I have briefly reviewed patients previous medical records in [facility].  Brief Narrative:  51 year old male with PMH of PTSD, bipolar disorder, ADD, depression, anxiety, stage III chronic kidney disease, seizures and DM 2 who was admitted 09/07/16 for evaluation of altered mental status and fall. He was noted to be noncompliant with his medications and to have been declining over the past year per report from his sister. Initial lab work unremarkable other than elevated ammonia level and CT scan of the head showed no evidence of acute intracranial abnormalities. He was noted to be restless and possibly psychotic on admission.He has stabilized. Therapies evaluation ongoing and recommend ALF. Difficult placement. Social worker following.   Assessment & Plan:   Principal Problem:   Dementia, early onset with advanced brain atrophy for age Active Problems:   Bipolar disorder ([place])   Anxiety state   Depression   Essential hypertension   Diabetes type 2, uncontrolled ([place])   Hypokalemia   Acute metabolic encephalopathy   HLD (hyperlipidemia)   PTSD (post-traumatic stress disorder)   Acute encephalopathy   CRI (chronic renal insufficiency), stage 3 (moderate)   Fall   Acute urinary retention   Acute lower UTI   History of seizures   Constipation  Early onset dementia with advanced brain atrophy for age/acute metabolic encephalopathy:  - Initial workup revealed an elevated ammonia level but folate, B12 and TSH levels were normal. CT head was unremarkable for acute findings. MRI brain also negative for acute findings but did show advanced brain atrophy. Repeat ammonia level normal at 22. He continued to have intermittent agitation/confusion. It is suspected that he has early onset dementia given significant brain atrophy for age and felt unlikely that he will be able to live in an  unsupervised environment and his sister is unable to provide him with 24-hour supervision. Psychiatry has seen him multiple times during this admission. As per their last follow-up on 09/22/16, patient stable on current medication regimen without acute symptoms of depression, anxiety, mania or psychosis, denied suicidal or homicidal ideations, intent or plans but continues to be somewhat confused, forgetful which seems chronic in nature. Psychiatry recommended continuing Seroquel 200 MG HS for mood swings, trazodone 100 MG HS for insomnia, Depakote 1500 MG HS for mood swings/bipolar disorder, Haldol 5 MG every 6 hours when necessary for agitation and combative behaviors, Ativan was discontinued, periodically monitor EKG for QTC prolongation (QTC 432 ms by EKG on 6/11) and valproic acid levels (34 on 10/05/16). Per psychiatry, he did not meet criteria for acute psychiatric admission.   Psychiatric illness including bipolar disorder, depression, anxiety & PTSD:  - Continue management as indicated in problem #1 as per psychiatry recommendations. Currently stable without agitation.   Essential hypertension:  - Controlled on no medications currently. Amlodipine and propranolol were discontinued due to borderline low blood pressures.   DM type II, uncontrolled:  - Poorly controlled. A1c 9.4 on 06/02/16. Noncompliant with most aspects of diabetes care at home. CBG acceptable on current regimen of 10 units Lantus at bedtime, 8 units NovoLog before meals, and insulin sliding scale, will increase his Lantus to 18 units at bedtime and stop NovoLog 8 units before meals, as upon discharge ALF will not be  able to do pre-meal insulin.  Hypokalemia:  - Resolved.  Seizure disorder:  - Valproic acid level 34. No reports  of recurrent seizures.  Stage III chronic kidney disease: -  Baseline creatinine appears to be in the 2.2-2.7 range. Euvolemic clinically. Creatinine stable 1.9 today  Falls:  - History of frequent  falls. Evaluated by neurology who recommended MRI brain and spine and findings of that were unremarkable for cause. Truvada discontinued in case it was contributing to his neuropathy. Neurology recommended outpatient follow-up for EMG/neuropsychiatric/neurocognitive testing . PT evaluation 6/15 >recommends ALF. Social worker exploring options.  Acute urinary retention:  - Noted on 09/09/16. Flomax started 5/25 and urinary retention resolved.  Escherichia coli Acute lower UTI:  - Felt to be CAUTI related to recent Foley catheterization for UTI. Completed course of ceriaxone. Treated.  HIV screening:  - Nonreactive  Chronic thrombocytopenia:  - Low platelet count noted dating back to 2017. Platelet count had normalized but again dropped to the 70s but stable over the last 48 hours, rechecked is 92 today  Constipation/hemorrhoids:  - reports constipation, rectal pain without bleeding. Bowel regimen started. Improved.   DVT prophylaxis: Lovenox Code Status: Full Family Communication: None at bedside Disposition: Awaiting placement, likely to ALF now. Awaiting PASR from state.   Consultants:  Psychiatry  Neurology  Procedures:  Foley catheter-discontinued  Antimicrobials:  Completed course of ceftriaxone 3 days    Subjective: Patient reports is feeling better today, lower back pain significantly improved   Objective:  Vitals:   10/08/16 0636 10/08/16 1434 10/08/16 2221 10/09/16 0453  BP: 106/68 123/75 128/72 108/71  Pulse:  82 72 66  Resp:  20 18 18   Temp:  98.2 F (36.8 C) 98.2 F (36.8 C) 97.5 F (36.4 C)  TempSrc:  Oral Oral Oral  SpO2:  98% 98% 100%  Weight:      Height:        Examination:   General exam: Sitting in bed, no apparent distress,  Respiratory system: Clear to auscultation. Respiratory effort normal. Stable Cardiovascular system: S1 & S2 heard, RRR. No JVD, murmurs, rubs, gallops or clicks. No pedal edema. Stable Gastrointestinal system: Abdomen  is nondistended, soft and nontender. No organomegaly or masses felt. Normal bowel sounds heard. Stable Central nervous system: Alert and oriented. No focal neurological deficits. Stable Extremities: Symmetric 5 x 5 power. Skin: No rashes, lesions or ulcers. No acute findings noted on feet exam today. Psychiatry: Judgement and insight appear normal. Mood & affect pleasant and interactive.     Data Reviewed: I have personally reviewed following labs and imaging studies  CBC:  Recent Labs Lab 10/05/16 0616 10/06/16 0731 10/07/16 0602  WBC 4.3 4.2 3.6*  HGB 12.7* 12.3* 12.2*  HCT 37.5* 37.1* 36.4*  MCV 90.4 89.4 89.2  PLT 79* 76* 92*   Basic Metabolic Panel:  Recent Labs Lab 10/05/16 0616 10/06/16 0633 10/07/16 0602  NA 138 137 137  K 4.5 4.5 4.3  CL 104 103 101  CO2 27 25 29   GLUCOSE 137* 116* 184*  BUN 16 20 18   CREATININE 1.98* 1.99* 1.92*  CALCIUM 8.9 8.7* 8.9   Liver Function Tests:  Recent Labs Lab 10/05/16 0616  AST 13*  ALT 9*  ALKPHOS 66  BILITOT 0.4  PROT 6.2*  ALBUMIN 3.1*   CBG:  Recent Labs Lab 10/08/16 1230 10/08/16 1632 10/08/16 2218 10/09/16 0752 10/09/16 1156  GLUCAP 139* 145* 177* 134* 212*         Radiology Studies: No results found.      Scheduled Meds: . cyanocobalamin  1,000 mcg Intramuscular Daily   Followed by  . [  START ON 10/11/2016] cyanocobalamin  1,000 mcg Intramuscular Weekly  . divalproex  1,500 mg Oral QHS  . docusate sodium  100 mg Oral BID  . folic acid  1 mg Oral Daily  . gabapentin  300 mg Oral QHS  . insulin aspart  0-15 Units Subcutaneous TID WC  . insulin aspart  0-5 Units Subcutaneous QHS  . insulin aspart  8 Units Subcutaneous TID WC  . insulin glargine  10 Units Subcutaneous QHS  . polyethylene glycol  17 g Oral BID  . potassium chloride  20 mEq Oral Daily  . QUEtiapine  200 mg Oral QHS  . tamsulosin  0.4 mg Oral Daily  . testosterone  5 g Transdermal Daily  . traZODone  100 mg Oral QHS    Continuous Infusions:   LOS: 32 days     Lyne Khurana, MD,  [facility] Pager [phone]  If 7PM-7AM, please contact night-coverage [email or web address] Password [facility] 10/09/2016, 2:07 PM

## 2016-10-10 DIAGNOSIS — R404 Transient alteration of awareness: Secondary | ICD-10-CM

## 2016-10-10 LAB — GLUCOSE, CAPILLARY
GLUCOSE-CAPILLARY: 137 mg/dL — AB (ref 65–99)
GLUCOSE-CAPILLARY: 189 mg/dL — AB (ref 65–99)
Glucose-Capillary: 193 mg/dL — ABNORMAL HIGH (ref 65–99)
Glucose-Capillary: 219 mg/dL — ABNORMAL HIGH (ref 65–99)

## 2016-10-10 NOTE — Progress Notes (Signed)
PROGRESS NOTE   LITTLE WINTON  [id number]    DOB: 1965-11-12    DOA: 09/07/2016  PCP: Beverly Sessions   I have briefly reviewed patients previous medical records in [facility].  Brief Narrative:  51 year old male with PMH of PTSD, bipolar disorder, ADD, depression, anxiety, stage III chronic kidney disease, seizures and DM 2 who was admitted 09/07/16 for evaluation of altered mental status and fall. He was noted to be noncompliant with his medications and to have been declining over the past year per report from his sister. Initial lab work unremarkable other than elevated ammonia level and CT scan of the head showed no evidence of acute intracranial abnormalities. He was noted to be restless and possibly psychotic on admission.He has stabilized. Therapies evaluation ongoing and recommend ALF. Difficult placement. Social worker following.   Assessment & Plan:   Principal Problem:   Dementia, early onset with advanced brain atrophy for age Active Problems:   Bipolar disorder ([place])   Anxiety state   Depression   Essential hypertension   Diabetes type 2, uncontrolled ([place])   Hypokalemia   Acute metabolic encephalopathy   HLD (hyperlipidemia)   PTSD (post-traumatic stress disorder)   Acute encephalopathy   CRI (chronic renal insufficiency), stage 3 (moderate)   Fall   Acute urinary retention   Acute lower UTI   History of seizures   Constipation  Early onset dementia with advanced brain atrophy for age/acute metabolic encephalopathy:  - Initial workup revealed an elevated ammonia level but folate, B12 and TSH levels were normal. CT head was unremarkable for acute findings. MRI brain also negative for acute findings but did show advanced brain atrophy. Repeat ammonia level normal at 22. He continued to have intermittent agitation/confusion. It is suspected that he has early onset dementia given significant brain atrophy for age and felt unlikely that he will be able to live in an  unsupervised environment and his sister is unable to provide him with 24-hour supervision. Psychiatry has seen him multiple times during this admission. As per their last follow-up on 09/22/16, patient stable on current medication regimen without acute symptoms of depression, anxiety, mania or psychosis, denied suicidal or homicidal ideations, intent or plans but continues to be somewhat confused, forgetful which seems chronic in nature. Psychiatry recommended continuing Seroquel 200 MG HS for mood swings, trazodone 100 MG HS for insomnia, Depakote 1500 MG HS for mood swings/bipolar disorder, Haldol 5 MG every 6 hours when necessary for agitation and combative behaviors, Ativan was discontinued, periodically monitor EKG for QTC prolongation (QTC 432 ms by EKG on 6/11) and valproic acid levels (34 on 10/05/16). Per psychiatry, he did not meet criteria for acute psychiatric admission.   Psychiatric illness including bipolar disorder, depression, anxiety & PTSD:  - Continue management as indicated in problem #1 as per psychiatry recommendations. Currently stable without agitation.   Essential hypertension:  - Controlled on no medications currently. Amlodipine and propranolol were discontinued due to borderline low blood pressures.   DM type II, uncontrolled:  - Poorly controlled. A1c 9.4 on 06/02/16. Noncompliant with most aspects of diabetes care at home. CBG acceptable on current regimen of 10 units Lantus at bedtime, 8 units NovoLog before meals, and insulin sliding scale, will increase his Lantus to 18 units at bedtime and stop NovoLog 8 units before meals, as upon discharge ALF will not be  able to do pre-meal insulin.  Hypokalemia:  - Resolved.  Seizure disorder:  - Valproic acid level 34. No reports  of recurrent seizures.  Stage III chronic kidney disease: -  Baseline creatinine appears to be in the 2.2-2.7 range. Euvolemic clinically. Creatinine stable 1.9 today  Falls:  - History of frequent  falls. Evaluated by neurology who recommended MRI brain and spine and findings of that were unremarkable for cause. Truvada discontinued in case it was contributing to his neuropathy. Neurology recommended outpatient follow-up for EMG/neuropsychiatric/neurocognitive testing . PT evaluation 6/15 >recommends ALF. Social worker exploring options.  Acute urinary retention:  - Noted on 09/09/16. Flomax started 5/25 and urinary retention resolved.  Escherichia coli Acute lower UTI:  - Felt to be CAUTI related to recent Foley catheterization for UTI. Completed course of ceriaxone. Treated.  HIV screening:  - Nonreactive  Chronic thrombocytopenia:  - Low platelet count noted dating back to 2017. Platelet count had normalized but again dropped to the 70s but stable over the last 48 hours, rechecked is 92 today  Constipation/hemorrhoids:  - reports constipation, rectal pain without bleeding. Bowel regimen started. Improved.   DVT prophylaxis: Lovenox Code Status: Full Family Communication: None at bedside Disposition: Awaiting placement, likely to ALF now. Awaiting PASR from state.   Consultants:  Psychiatry  Neurology  Procedures:  Foley catheter-discontinued  Antimicrobials:  Completed course of ceftriaxone 3 days    Subjective: Patient reports is feeling better today, lower back pain significantly improved   Objective:  Vitals:   10/09/16 0453 10/09/16 1413 10/09/16 2126 10/10/16 0458  BP: 108/71 113/79 131/81 110/75  Pulse: 66 70 77 68  Resp: 18 16 18 18   Temp: 97.5 F (36.4 C) 98.2 F (36.8 C) 98.8 F (37.1 C) 98 F (36.7 C)  TempSrc: Oral   Oral  SpO2: 100% 98% 98% 96%  Weight:      Height:        Examination:   General exam: Sitting in bed, no apparent distress,  Respiratory system: Clear to auscultation. Respiratory effort normal. Stable Cardiovascular system: S1 & S2 heard, RRR. No JVD, murmurs, rubs, gallops or clicks. No pedal edema.  Stable Gastrointestinal system: Abdomen is nondistended, soft and nontender. No organomegaly or masses felt. Normal bowel sounds heard. Stable Central nervous system: Alert and oriented. No focal neurological deficits. Stable Extremities: Symmetric 5 x 5 power. Stable Skin: No rashes, lesions or ulcers. No acute findings noted on feet exam today. Psychiatry: Judgement and insight appear normal. Mood & affect pleasant and interactive.     Data Reviewed: I have personally reviewed following labs and imaging studies  CBC:  Recent Labs Lab 10/05/16 0616 10/06/16 0731 10/07/16 0602  WBC 4.3 4.2 3.6*  HGB 12.7* 12.3* 12.2*  HCT 37.5* 37.1* 36.4*  MCV 90.4 89.4 89.2  PLT 79* 76* 92*   Basic Metabolic Panel:  Recent Labs Lab 10/05/16 0616 10/06/16 0633 10/07/16 0602  NA 138 137 137  K 4.5 4.5 4.3  CL 104 103 101  CO2 27 25 29   GLUCOSE 137* 116* 184*  BUN 16 20 18   CREATININE 1.98* 1.99* 1.92*  CALCIUM 8.9 8.7* 8.9   Liver Function Tests:  Recent Labs Lab 10/05/16 0616  AST 13*  ALT 9*  ALKPHOS 66  BILITOT 0.4  PROT 6.2*  ALBUMIN 3.1*   CBG:  Recent Labs Lab 10/09/16 0752 10/09/16 1156 10/09/16 1659 10/09/16 2122 10/10/16 0832  GLUCAP 134* 212* 172* 158* 137*         Radiology Studies: No results found.      Scheduled Meds: . cyanocobalamin  1,000 mcg Intramuscular Daily  Followed by  . [START ON 10/11/2016] cyanocobalamin  1,000 mcg Intramuscular Weekly  . divalproex  1,500 mg Oral QHS  . docusate sodium  100 mg Oral BID  . folic acid  1 mg Oral Daily  . gabapentin  300 mg Oral QHS  . insulin aspart  0-15 Units Subcutaneous TID WC  . insulin aspart  0-5 Units Subcutaneous QHS  . insulin glargine  22 Units Subcutaneous QHS  . polyethylene glycol  17 g Oral BID  . potassium chloride  20 mEq Oral Daily  . QUEtiapine  200 mg Oral QHS  . tamsulosin  0.4 mg Oral Daily  . testosterone  5 g Transdermal Daily  . traZODone  100 mg Oral QHS    Continuous Infusions:   LOS: 33 days     Shyra Emile, MD,  [facility] Pager [phone]  If 7PM-7AM, please contact night-coverage [email or web address] Password [facility] 10/10/2016, 12:22 PM

## 2016-10-10 NOTE — Progress Notes (Signed)
CSW received call from [place], Jacqualin Combes [phone]). She is coming to assess patient on Sunday and will generate the ALF pasrr number at that time.   Percell Locus Pasty Manninen LCSWA [phone]

## 2016-10-11 LAB — GLUCOSE, CAPILLARY
GLUCOSE-CAPILLARY: 178 mg/dL — AB (ref 65–99)
GLUCOSE-CAPILLARY: 307 mg/dL — AB (ref 65–99)
Glucose-Capillary: 164 mg/dL — ABNORMAL HIGH (ref 65–99)
Glucose-Capillary: 127 mg/dL — ABNORMAL HIGH (ref 65–99)

## 2016-10-11 LAB — CBC
HCT: 37.7 % — ABNORMAL LOW (ref 39.0–52.0)
HEMOGLOBIN: 12.6 g/dL — AB (ref 13.0–17.0)
MCH: 30.1 pg (ref 26.0–34.0)
MCHC: 33.4 g/dL (ref 30.0–36.0)
MCV: 90 fL (ref 78.0–100.0)
Platelets: 93 10*3/uL — ABNORMAL LOW (ref 150–400)
RBC: 4.19 MIL/uL — AB (ref 4.22–5.81)
RDW: 13.7 % (ref 11.5–15.5)
WBC: 4.6 10*3/uL (ref 4.0–10.5)

## 2016-10-11 LAB — BASIC METABOLIC PANEL
Anion gap: 7 (ref 5–15)
BUN: 19 mg/dL (ref 6–20)
CHLORIDE: 101 mmol/L (ref 101–111)
CO2: 30 mmol/L (ref 22–32)
CREATININE: 2.15 mg/dL — AB (ref 0.61–1.24)
Calcium: 8.9 mg/dL (ref 8.9–10.3)
GFR calc Af Amer: 40 mL/min — ABNORMAL LOW (ref >60)
GFR calc non Af Amer: 35 mL/min — ABNORMAL LOW (ref >60)
Glucose, Bld: 153 mg/dL — ABNORMAL HIGH (ref 65–99)
POTASSIUM: 4.5 mmol/L (ref 3.5–5.1)
SODIUM: 138 mmol/L (ref 135–145)

## 2016-10-11 MED ORDER — ENOXAPARIN SODIUM 40 MG/0.4ML ~~LOC~~ SOLN
40.0000 mg | SUBCUTANEOUS | Status: DC
  Administered 2016-10-11 – 2016-10-13 (×3): 40 mg via SUBCUTANEOUS
  Filled 2016-10-11 (×3): qty 0.4

## 2016-10-11 MED ORDER — CLONAZEPAM 1 MG PO TABS
1.0000 mg | ORAL_TABLET | Freq: Two times a day (BID) | ORAL | Status: DC | PRN
  Administered 2016-10-11 – 2016-10-13 (×4): 1 mg via ORAL
  Filled 2016-10-11 (×4): qty 1

## 2016-10-11 NOTE — Progress Notes (Signed)
PROGRESS NOTE   [name]  OZD:[id number]    DOB: 1965/08/02    DOA: 09/07/2016  PCP: Beverly Sessions   I have briefly reviewed patients previous medical records in [facility].  Brief Narrative:  51 year old male with PMH of PTSD, bipolar disorder, ADD, depression, anxiety, stage III chronic kidney disease, seizures and DM 2 who was admitted 09/07/16 for evaluation of altered mental status and fall. He was noted to be noncompliant with his medications and to have been declining over the past year per report from his sister. Initial lab work unremarkable other than elevated ammonia level and CT scan of the head showed no evidence of acute intracranial abnormalities. He was noted to be restless and possibly psychotic on admission.He has stabilized. Therapies evaluation ongoing and recommend ALF. Difficult placement. Social worker following.   Assessment & Plan:   Principal Problem:   Dementia, early onset with advanced brain atrophy for age Active Problems:   Bipolar disorder ([place])   Anxiety state   Depression   Essential hypertension   Diabetes type 2, uncontrolled ([place])   Hypokalemia   Acute metabolic encephalopathy   HLD (hyperlipidemia)   PTSD (post-traumatic stress disorder)   Acute encephalopathy   CRI (chronic renal insufficiency), stage 3 (moderate)   Fall   Acute urinary retention   Acute lower UTI   History of seizures   Constipation  Early onset dementia with advanced brain atrophy for age/acute metabolic encephalopathy:  - Initial workup revealed an elevated ammonia level but folate, B12 and TSH levels were normal. CT head was unremarkable for acute findings. MRI brain also negative for acute findings but did show advanced brain atrophy. Repeat ammonia level normal at 22. He continued to have intermittent agitation/confusion. It is suspected that he has early onset dementia given significant brain atrophy for age and felt unlikely that he will be able to live in an  unsupervised environment and his sister is unable to provide him with 24-hour supervision. Psychiatry has seen him multiple times during this admission. As per their last follow-up on 09/22/16, patient stable on current medication regimen without acute symptoms of depression, anxiety, mania or psychosis, denied suicidal or homicidal ideations, intent or plans but continues to be somewhat confused, forgetful which seems chronic in nature. Psychiatry recommended continuing Seroquel 200 MG HS for mood swings, trazodone 100 MG HS for insomnia, Depakote 1500 MG HS for mood swings/bipolar disorder, Haldol 5 MG every 6 hours when necessary for agitation and combative behaviors, Ativan was discontinued, periodically monitor EKG for QTC prolongation (QTC 432 ms by EKG on 6/11) and valproic acid levels (34 on 10/05/16). Per psychiatry, he did not meet criteria for acute psychiatric admission.   Psychiatric illness including bipolar disorder, depression, anxiety & PTSD:  - Continue management as indicated in problem #1 as per psychiatry recommendations. Currently stable without agitation.   Essential hypertension:  - Controlled on no medications currently. Amlodipine and propranolol were discontinued due to borderline low blood pressures.   DM type II, uncontrolled:  - Poorly controlled. A1c 9.4 on 06/02/16. Noncompliant with most aspects of diabetes care at home. CBG acceptable on current regimen of 10 units Lantus at bedtime, 8 units NovoLog before meals, and insulin sliding scale, will increase his Lantus to 18 units at bedtime and stop NovoLog 8 units before meals, as upon discharge ALF will not be  able to do pre-meal insulin.  Hypokalemia:  - Resolved.  Seizure disorder:  - Valproic acid level 34. No reports  of recurrent seizures.  Stage III chronic kidney disease: -  Baseline creatinine appears to be in the 2.2-2.7 range. Euvolemic clinically. Creatinine stable 1.9 today  Falls:  - History of frequent  falls. Evaluated by neurology who recommended MRI brain and spine and findings of that were unremarkable for cause. Truvada discontinued in case it was contributing to his neuropathy. Neurology recommended outpatient follow-up for EMG/neuropsychiatric/neurocognitive testing . PT evaluation 6/15 >recommends ALF. Social worker exploring options.  Acute urinary retention:  - Noted on 09/09/16. Flomax started 5/25 and urinary retention resolved.  Escherichia coli Acute lower UTI:  - Felt to be CAUTI related to recent Foley catheterization for UTI. Completed course of ceriaxone. Treated.  HIV screening:  - Nonreactive  Chronic thrombocytopenia:  - Low platelet count noted dating back to 2017. Platelet count had normalized but again dropped to the 70s but stable over the last 48 hours, rechecked is 92 today  Constipation/hemorrhoids:  - reports constipation, rectal pain without bleeding. Bowel regimen started. Improved.   DVT prophylaxis: Lovenox Code Status: Full Family Communication: None at bedside Disposition: Awaiting placement, likely to ALF now. Awaiting PASR from state.   Consultants:  Psychiatry  Neurology  Procedures:  Foley catheter-discontinued  Antimicrobials:  Completed course of ceftriaxone 3 days    Subjective: Patient denies any complaints, lower back almost resolved, no chest pain, no shortness of breath  Objective:  Vitals:   10/10/16 0458 10/10/16 1518 10/10/16 2223 10/11/16 0454  BP: 110/75 (!) 140/102 123/68 (!) 97/47  Pulse: 68 81 88 68  Resp: 18 14 16 16   Temp: 98 F (36.7 C) 98.2 F (36.8 C) 98.6 F (37 C) 98.7 F (37.1 C)  TempSrc: Oral     SpO2: 96% 98% 97% 96%  Weight:      Height:        Examination:   General exam: Sitting in bed, no apparent distress,  Respiratory system: Clear to auscultation, good air entry bilaterally  Cardiovascular system: Regular rate and rhythm, no rubs, gallops S1 & S2 heard, RRR. No JVD, murmurs, rubs,  gallops or clicks. No pedal edema. stable Gastrointestinal system: Abdomen soft, nontender, nondistended, bowel sounds present Abdomen is nondistended, soft and nontender. No organomegaly or masses felt. Normal bowel sounds heard. Stable Central nervous system: Alert and oriented. No focal neurological deficits. Stable Extremities: Symmetric 5 x 5 power. Stable Psychiatry: Judgement and insight appear normal. Mood & affect pleasant and interactive.     Data Reviewed: I have personally reviewed following labs and imaging studies  CBC:  Recent Labs Lab 10/05/16 0616 10/06/16 0731 10/07/16 0602 10/11/16 0422  WBC 4.3 4.2 3.6* 4.6  HGB 12.7* 12.3* 12.2* 12.6*  HCT 37.5* 37.1* 36.4* 37.7*  MCV 90.4 89.4 89.2 90.0  PLT 79* 76* 92* 93*   Basic Metabolic Panel:  Recent Labs Lab 10/05/16 0616 10/06/16 0633 10/07/16 0602 10/11/16 0422  NA 138 137 137 138  K 4.5 4.5 4.3 4.5  CL 104 103 101 101  CO2 27 25 29 30   GLUCOSE 137* 116* 184* 153*  BUN 16 20 18 19   CREATININE 1.98* 1.99* 1.92* 2.15*  CALCIUM 8.9 8.7* 8.9 8.9   Liver Function Tests:  Recent Labs Lab 10/05/16 0616  AST 13*  ALT 9*  ALKPHOS 66  BILITOT 0.4  PROT 6.2*  ALBUMIN 3.1*   CBG:  Recent Labs Lab 10/10/16 1250 10/10/16 1713 10/10/16 2221 10/11/16 0826 10/11/16 1158  GLUCAP 189* 193* 219* 178* 164*  Radiology Studies: No results found.      Scheduled Meds: . cyanocobalamin  1,000 mcg Intramuscular Weekly  . divalproex  1,500 mg Oral QHS  . docusate sodium  100 mg Oral BID  . enoxaparin (LOVENOX) injection  40 mg Subcutaneous Q24H  . folic acid  1 mg Oral Daily  . gabapentin  300 mg Oral QHS  . insulin aspart  0-15 Units Subcutaneous TID WC  . insulin aspart  0-5 Units Subcutaneous QHS  . insulin glargine  22 Units Subcutaneous QHS  . polyethylene glycol  17 g Oral BID  . potassium chloride  20 mEq Oral Daily  . QUEtiapine  200 mg Oral QHS  . tamsulosin  0.4 mg Oral  Daily  . testosterone  5 g Transdermal Daily  . traZODone  100 mg Oral QHS   Continuous Infusions:   LOS: 34 days     Carl Butner, MD,  [facility] Pager [phone]  If 7PM-7AM, please contact night-coverage [email or web address] Password [email or web address] 10/11/2016, 1:14 PM

## 2016-10-12 DIAGNOSIS — F3175 Bipolar disorder, in partial remission, most recent episode depressed: Secondary | ICD-10-CM

## 2016-10-12 LAB — GLUCOSE, CAPILLARY
GLUCOSE-CAPILLARY: 182 mg/dL — AB (ref 65–99)
GLUCOSE-CAPILLARY: 185 mg/dL — AB (ref 65–99)
GLUCOSE-CAPILLARY: 238 mg/dL — AB (ref 65–99)
Glucose-Capillary: 152 mg/dL — ABNORMAL HIGH (ref 65–99)

## 2016-10-12 MED ORDER — BUPROPION HCL ER (XL) 150 MG PO TB24
300.0000 mg | ORAL_TABLET | Freq: Every day | ORAL | Status: DC
Start: 2016-10-12 — End: 2016-10-13
  Administered 2016-10-12 – 2016-10-13 (×2): 300 mg via ORAL
  Filled 2016-10-12 (×2): qty 2

## 2016-10-12 NOTE — Progress Notes (Signed)
PROGRESS NOTE   Jonathan LOFGREN  VOJ:[id number]    DOB: [birth year]    DOA: 09/07/2016  PCP: Beverly Sessions   I have briefly reviewed patients previous medical records in [facility].  Brief Narrative:  51 year old male with PMH of PTSD, bipolar disorder, ADD, depression, anxiety, stage III chronic kidney disease, seizures and DM 2 who was admitted 09/07/16 for evaluation of altered mental status and fall. He was noted to be noncompliant with his medications and to have been declining over the past year per report from his sister. Initial lab work unremarkable other than elevated ammonia level and CT scan of the head showed no evidence of acute intracranial abnormalities. He was noted to be restless and possibly psychotic on admission.He has stabilized. Therapies evaluation ongoing and recommend ALF. Difficult placement. Social worker following.   Assessment & Plan:   Principal Problem:   Dementia, early onset with advanced brain atrophy for age Active Problems:   Bipolar disorder ([place])   Anxiety state   Depression   Essential hypertension   Diabetes type 2, uncontrolled ([place])   Hypokalemia   Acute metabolic encephalopathy   HLD (hyperlipidemia)   PTSD (post-traumatic stress disorder)   Acute encephalopathy   CRI (chronic renal insufficiency), stage 3 (moderate)   Fall   Acute urinary retention   Acute lower UTI   History of seizures   Constipation  Early onset dementia with advanced brain atrophy for age/acute metabolic encephalopathy:  - Initial workup revealed an elevated ammonia level but folate, B12 and TSH levels were normal. CT head was unremarkable for acute findings. MRI brain also negative for acute findings but did show advanced brain atrophy. Repeat ammonia level normal at 22. He continued to have intermittent agitation/confusion. It is suspected that he has early onset dementia given significant brain atrophy for age and felt unlikely that he will be able to live in an  unsupervised environment and his sister is unable to provide him with 24-hour supervision. Psychiatry has seen him multiple times during this admission. As per their last follow-up on 09/22/16, patient stable on current medication regimen without acute symptoms of depression, anxiety, mania or psychosis, denied suicidal or homicidal ideations, intent or plans but continues to be somewhat confused, forgetful which seems chronic in nature. Psychiatry recommended continuing Seroquel 200 MG HS for mood swings, trazodone 100 MG HS for insomnia, Depakote 1500 MG HS for mood swings/bipolar disorder, Haldol 5 MG every 6 hours when necessary for agitation and combative behaviors, Ativan was discontinued, periodically monitor EKG for QTC prolongation (QTC 432 ms by EKG on 6/11) and valproic acid levels (34 on 10/05/16). Per psychiatry, he did not meet criteria for acute psychiatric admission.   Psychiatric illness including bipolar disorder, depression, anxiety & PTSD:  - Continue management as indicated in problem #1 as per psychiatry recommendations. Currently stable without agitation.  - Resumed on Wellbutrin  Essential hypertension:  - Controlled on no medications currently. Amlodipine and propranolol were discontinued due to borderline low blood pressures.   DM type II, uncontrolled:  - Poorly controlled. A1c 9.4 on 06/02/16. Noncompliant with most aspects of diabetes care at home. CBG acceptable on current regimen of 18 units Lantus at bedtime, and insulin sliding scale   Hypokalemia:  - Resolved.  Seizure disorder:  - Valproic acid level 34. No reports of recurrent seizures.  Stage III chronic kidney disease: -  Baseline creatinine appears to be in the 2.2-2.7 range. Euvolemic clinically.   Falls:  - History of  frequent falls. Evaluated by neurology who recommended MRI brain and spine and findings of that were unremarkable for cause. Truvada discontinued in case it was contributing to his neuropathy.  Neurology recommended outpatient follow-up for EMG/neuropsychiatric/neurocognitive testing . PT evaluation 6/15 >recommends ALF. Social worker exploring options.  Acute urinary retention:  - Noted on 09/09/16. Flomax started 5/25 and urinary retention resolved.  Escherichia coli Acute lower UTI:  - Felt to be CAUTI related to recent Foley catheterization for UTI. Completed course of ceriaxone. Treated.  HIV screening:  - Nonreactive  Chronic thrombocytopenia:  - Low platelet count noted dating back to 2017. Platelet count had normalized but again dropped to the 70s but stable over the last 48 hours, rechecked is 92 today  Constipation/hemorrhoids:  - reports constipation, rectal pain without bleeding. Bowel regimen started. Improved.   DVT prophylaxis: Lovenox Code Status: Full Family Communication: None at bedside Disposition: Awaiting placement, likely to ALF now. Awaiting PASR from state.   Consultants:  Psychiatry  Neurology  Procedures:  Foley catheter-discontinued  Antimicrobials:  Completed course of ceftriaxone 3 days    Subjective: Patient denies any complaints, no chest pain, no shortness of breath, no nausea or vomiting .  Objective:  Vitals:   10/11/16 0454 10/11/16 1519 10/11/16 2124 10/12/16 0647  BP: (!) 97/47 137/73 128/75 113/69  Pulse: 68 74 72 65  Resp: 16 20 18 20   Temp: 98.7 F (37.1 C)  98.2 F (36.8 C) 97.9 F (36.6 C)  TempSrc:   Oral Oral  SpO2: 96% 97% 96% 98%  Weight:      Height:        Examination:   General exam: Laying in bed in no apparent distress  Respiratory system: Clear to auscultation, good air entry bilaterally  Cardiovascular system: Regular rate and rhythm, no rubs, gallops S1 & S2 heard, RRR. No JVD, murmurs, rubs, gallops or clicks. No pedal edema.  Gastrointestinal system: Abdomen soft, nontender, nondistended, bowel sounds present  Central nervous system: Alert and oriented. No focal neurological  deficits. Psychiatry: Judgement and insight appear normal. Mood & affect pleasant and interactive.     Data Reviewed: I have personally reviewed following labs and imaging studies  CBC:  Recent Labs Lab 10/06/16 0731 10/07/16 0602 10/11/16 0422  WBC 4.2 3.6* 4.6  HGB 12.3* 12.2* 12.6*  HCT 37.1* 36.4* 37.7*  MCV 89.4 89.2 90.0  PLT 76* 92* 93*   Basic Metabolic Panel:  Recent Labs Lab 10/06/16 0633 10/07/16 0602 10/11/16 0422  NA 137 137 138  K 4.5 4.3 4.5  CL 103 101 101  CO2 25 29 30   GLUCOSE 116* 184* 153*  BUN 20 18 19   CREATININE 1.99* 1.92* 2.15*  CALCIUM 8.7* 8.9 8.9   Liver Function Tests: No results for input(s): AST, ALT, ALKPHOS, BILITOT, PROT, ALBUMIN in the last 168 hours. CBG:  Recent Labs Lab 10/11/16 0826 10/11/16 1158 10/11/16 1729 10/11/16 2122 10/12/16 0810  GLUCAP 178* 164* 127* 307* 152*         Radiology Studies: No results found.      Scheduled Meds: . buPROPion  300 mg Oral Daily  . cyanocobalamin  1,000 mcg Intramuscular Weekly  . divalproex  1,500 mg Oral QHS  . docusate sodium  100 mg Oral BID  . enoxaparin (LOVENOX) injection  40 mg Subcutaneous Q24H  . folic acid  1 mg Oral Daily  . gabapentin  300 mg Oral QHS  . insulin aspart  0-15 Units Subcutaneous TID WC  .  insulin aspart  0-5 Units Subcutaneous QHS  . insulin glargine  22 Units Subcutaneous QHS  . polyethylene glycol  17 g Oral BID  . potassium chloride  20 mEq Oral Daily  . QUEtiapine  200 mg Oral QHS  . tamsulosin  0.4 mg Oral Daily  . testosterone  5 g Transdermal Daily  . traZODone  100 mg Oral QHS   Continuous Infusions:   LOS: 35 days     Analiah Drum, MD,  [facility] Pager [phone]  If 7PM-7AM, please contact night-coverage [email or web address] Password [email or web address] 10/12/2016, 11:40 AM

## 2016-10-13 ENCOUNTER — Encounter: Payer: Self-pay | Admitting: Internal Medicine

## 2016-10-13 DIAGNOSIS — N390 Urinary tract infection, site not specified: Secondary | ICD-10-CM

## 2016-10-13 LAB — GLUCOSE, CAPILLARY
Glucose-Capillary: 170 mg/dL — ABNORMAL HIGH (ref 65–99)
Glucose-Capillary: 254 mg/dL — ABNORMAL HIGH (ref 65–99)

## 2016-10-13 MED ORDER — POLYETHYLENE GLYCOL 3350 17 G PO PACK: 17.0000 g | PACK | Freq: Every day | ORAL | 0 refills | Status: DC

## 2016-10-13 MED ORDER — TAMSULOSIN HCL 0.4 MG PO CAPS: 0.8000 mg | ORAL_CAPSULE | Freq: Every day | ORAL | Status: DC

## 2016-10-13 MED ORDER — TAMSULOSIN HCL 0.4 MG PO CAPS: 0.8000 mg | ORAL_CAPSULE | Freq: Every day | ORAL | 0 refills | Status: DC

## 2016-10-13 MED ORDER — CLONAZEPAM 1 MG PO TABS: 1.0000 mg | ORAL_TABLET | Freq: Two times a day (BID) | ORAL | 0 refills | Status: DC | PRN

## 2016-10-13 MED ORDER — POTASSIUM CHLORIDE CRYS ER 20 MEQ PO TBCR: 20.0000 meq | EXTENDED_RELEASE_TABLET | Freq: Every day | ORAL | 0 refills | Status: DC

## 2016-10-13 MED ORDER — DOCUSATE SODIUM 100 MG PO CAPS: 100.0000 mg | ORAL_CAPSULE | Freq: Two times a day (BID) | ORAL | 0 refills | Status: DC

## 2016-10-13 MED ORDER — DIVALPROEX SODIUM 500 MG PO DR TAB: 1500.0000 mg | DELAYED_RELEASE_TABLET | Freq: Every day | ORAL | 0 refills | Status: DC

## 2016-10-13 MED ORDER — INSULIN GLARGINE 100 UNIT/ML ~~LOC~~ SOLN: 22.0000 [IU] | Freq: Every day | SUBCUTANEOUS | 1 refills | Status: DC

## 2016-10-13 MED ORDER — FOLIC ACID 1 MG PO TABS: 1.0000 mg | ORAL_TABLET | Freq: Every day | ORAL | 0 refills | Status: DC

## 2016-10-13 MED ORDER — METHOCARBAMOL 500 MG PO TABS: 500.0000 mg | ORAL_TABLET | Freq: Two times a day (BID) | ORAL | 0 refills | Status: DC

## 2016-10-13 MED ORDER — TRAMADOL HCL 50 MG PO TABS: 50.0000 mg | ORAL_TABLET | Freq: Three times a day (TID) | ORAL | 0 refills | Status: DC | PRN

## 2016-10-13 MED ORDER — ATORVASTATIN CALCIUM 10 MG PO TABS: 10.0000 mg | ORAL_TABLET | Freq: Every day | ORAL | 0 refills | Status: DC

## 2016-10-13 MED ORDER — QUETIAPINE FUMARATE 400 MG PO TABS: 400.0000 mg | ORAL_TABLET | Freq: Every day | ORAL | 11 refills | Status: DC

## 2016-10-13 MED ORDER — CYANOCOBALAMIN 500 MCG PO TABS: 500.0000 ug | ORAL_TABLET | Freq: Every day | ORAL | 0 refills | Status: DC

## 2016-10-13 MED ORDER — TRAZODONE HCL 150 MG PO TABS: 150.0000 mg | ORAL_TABLET | Freq: Every day | ORAL | 1 refills | Status: DC

## 2016-10-13 MED ORDER — ACETAMINOPHEN 325 MG PO TABS: 650.0000 mg | ORAL_TABLET | Freq: Four times a day (QID) | ORAL | Status: DC | PRN

## 2016-10-13 MED ORDER — BUPROPION HCL ER (XL) 300 MG PO TB24: 300.0000 mg | ORAL_TABLET | Freq: Every day | ORAL | 0 refills | Status: DC

## 2016-10-13 NOTE — Progress Notes (Signed)
Patient will DC to: [facility] Anticipated DC date: 10/13/16 Family notified: Sister Transport by: Marella Chimes Wheelchair [employer] 3:30pm-4pm   Per MD patient ready for DC to [facility]. RN, patient, patient's family, and facility notified of DC. Discharge Summary, FL2, LOG sent to facility. RN given number for report [phone]). DC packet on chart. Transport requested for patient and will call RN when patient needs to be wheeled downstairs.   CSW signing off.  Cedric Fishman, [place] Social Worker [phone]

## 2016-10-13 NOTE — Clinical Social Work Placement (Signed)
   CLINICAL SOCIAL WORK PLACEMENT  NOTE  Date:  10/13/2016  Patient Details  Name: [name] MRN: [id number] Date of Birth: 1966-03-02  Clinical Social Work is seeking post-discharge placement for this patient at the [place] level of care (*CSW will initial, date and re-position this form in  chart as items are completed):  Yes   Patient/family provided with [facility] Work Department's list of facilities offering this level of care within the geographic area requested by the patient (or if unable, by the patient's family).  Yes   Patient/family informed of their freedom to choose among providers that offer the needed level of care, that participate in Medicare, Medicaid or managed care program needed by the patient, have an available bed and are willing to accept the patient.  Yes   Patient/family informed of [facility]'s ownership interest in [facility] and [facility], as well as of the fact that they are under no obligation to receive care at these facilities.  PASRR submitted to EDS on 09/18/16     PASRR number received on       Existing PASRR number confirmed on       FL2 transmitted to all facilities in geographic area requested by pt/family on 09/18/16     FL2 transmitted to all facilities within larger geographic area on       Patient informed that his/her managed care company has contracts with or will negotiate with certain facilities, including the following:        Yes   Patient/family informed of bed offers received.  Patient chooses bed at [facility]     Physician recommends and patient chooses bed at      Patient to be transferred to [facility] on 10/13/16.  Patient to be transferred to facility by [facility]     Patient family notified on 10/13/16 of transfer.  Name of family member notified:  Sister [name]     PHYSICIAN Please sign FL2     Additional  Comment:    _______________________________________________ Benard Halsted, [place] 10/13/2016, 1:05 PM

## 2016-10-13 NOTE — Discharge Summary (Signed)
[name], is a 51 y.o. male  DOB 1965/08/02  MRN [id number].  Admission date:  09/07/2016  Admitting Physician  Ivor Costa, MD  Discharge Date:  10/13/2016   Primary MD  Beverly Sessions  Recommendations for primary care physician for things to follow:  - Please check CBC, BMP during next visit - Please monitor CBGs, and adjust Lantus dose as needed - Neurology recommended outpatient follow-up for EMG/neuropsychiatric/neurocognitive testing  Admission Diagnosis  Transient alteration of awareness [R40.4] CRI (chronic renal insufficiency), stage 3 (moderate) [N18.3]   Discharge Diagnosis  Transient alteration of awareness [R40.4] CRI (chronic renal insufficiency), stage 3 (moderate) [N18.3]    Principal Problem:   Dementia, early onset with advanced brain atrophy for age Active Problems:   Bipolar disorder ([place])   Anxiety state   Depression   Essential hypertension   Diabetes type 2, uncontrolled ([place])   Hypokalemia   Acute metabolic encephalopathy   HLD (hyperlipidemia)   PTSD (post-traumatic stress disorder)   Acute encephalopathy   CRI (chronic renal insufficiency), stage 3 (moderate)   Fall   Acute urinary retention   Acute lower UTI   History of seizures   Constipation      Past Medical History:  Diagnosis Date  . ADD (attention deficit disorder)   . Anxiety   . Arthritis    right hip  . Bipolar 1 disorder ([place])   . Blood in urine   . CKD (chronic kidney disease), stage III   . Creatinine elevation   . Depression    bipolar [facility]  . Diabetes mellitus without complication ([place])   . Family history of anesthesia complication    pt is unsure , but pt father may have been difficult to arouse   . HCAP (healthcare-associated pneumonia) 10/31/2012  . History of kidney stones   . Hypertension   . Hypogonadism male   . Liver fatty degeneration   . Microscopic hematuria    hereditary s/p Urology eval  . Osteoarthritis of right hip 11/28/2011   2012 2015 s/p THR Severe  Dr Novella Olive    . Pleural effusion 11/02/2012  . Pneumonia 10-2012  . Pneumonia, organism unspecified(486) 11/02/2012  . Polysubstance dependence, non-opioid, in remission ([place])    remote  . Primary osteoarthritis of left hip 05/22/2015  . PTSD (post-traumatic stress disorder)    SOCIAL ANXIETY DISORDER   . Suicide attempt by multiple drug overdose ([place]) Jan 25, 2016   Grieving his cat's death 07/15/2015    Past Surgical History:  Procedure Laterality Date  . BACK SURGERY    . CLOSED REDUCTION METACARPAL WITH PERCUTANEOUS PINNING Right   . LUMBAR DISC SURGERY    . TONSILLECTOMY    . TOTAL HIP ARTHROPLASTY Right 08/16/2013   Procedure: TOTAL HIP ARTHROPLASTY ANTERIOR APPROACH;  Surgeon: Hessie Dibble, MD;  Location: [place];  Service: Orthopedics;  Laterality: Right;  . TOTAL HIP ARTHROPLASTY Left 05/22/2015   Procedure: TOTAL HIP ARTHROPLASTY ANTERIOR APPROACH;  Surgeon: Melrose Nakayama, MD;  Location: [facility];  Service:  Orthopedics;  Laterality: Left;       History of present illness and  Hospital Course:     Kindly see H&P for history of present illness and admission details, please review complete Labs, Consult reports and Test reports for all details in brief  HPI  from the history and physical done on the day of admission 09/07/2016  HPI: [name] is a 51 y.o. male with medical history significant of PTSD, bipolar disorder, ADD, depression, anxiety, GERD, chronic kidney disease-stage III, seizure, hypertension, hyperlipidemia, diabetes mellitus, who presents with altered mental status.  Patient has AMS and is unable to provide medical history, therefore, most of the history is obtained by discussing the case with ED physician, per EMS report, and with the nursing staff. Per nurse report, patient's sister reported that patient has been cognitively declining for the past year. Patient had  multiple falls at home. He has not been compliant to his medications. He only takes when he thinks he needs them. Since yesterday morning, patient become more confused. Pt is restless. Pt moves all extremities, but cannot walk (usually pt is unsteady holding onto objects as he is walking). Pt has auditory and visual hallucinations. When I saw pt in ED, pt is confused and restless. He is not oriented x 3 and could not provide any history. He moves all extremities. No facial droop or slurred speech. He has bruises in both arms. No active cough, nausea, vomiting or diarrhea noted. He does not seem to have chest pain or abdominal pain.  ED Course: pt was found to have WBC 6.1, INR 1.09, negative troponin, negative UDS, negative urinalysis, valproic acid therapeutic 78,  Alcohol<5, potassium 3.4, stable renal function, temperature 99.5, heart rate in 90s, oxygen sat 98% on room air, negative chest x-ray, negative CT head for acute intracranial abnormalities. Patient is admitted to stepdown as inpatient.  Hospital Course  51 year old male with PMH of PTSD, bipolar disorder, ADD, depression, anxiety, stage III chronic kidney disease, seizures and DM 2 who was admitted 09/07/16 for evaluation of altered mental status and fall. He was noted to be noncompliant with his medications and to have been declining over the past year per report from his sister. Initial lab work unremarkable other than elevated ammonia level and CT scan of the head showed no evidence of acute intracranial abnormalities. He was noted to be restless and possibly psychotic on admission.He has stabilized. Therapies evaluation ongoing and recommend ALF.   Encephalopathy  - Secondary to underlying psychiatric disorder, and evidence of mild brain atrophy, and acute metabolic encephalopathy - Initial workup revealed an elevated ammonia level but folate, B12 and TSH levels were normal. CT head was unremarkable for acute findings. MRI brain also  negative for acute findings but did show advanced brain atrophy. Repeat ammonia level normal at 22. He continued to have intermittent agitation/confusion. It is suspected that he has early onset dementia given significant brain atrophy for age and felt unlikely that he will be able to live in an unsupervised environment and his sister is unable to provide him with 24-hour supervision. Psychiatry has seen him multiple times during this admission. As per their last follow-up on 09/22/16, patient stable on current medication regimen without acute symptoms of depression, anxiety, mania or psychosis, denied suicidal or homicidal ideations, intent or plans but continues to be somewhat confused, forgetful which seems chronic in nature. Psychiatry recommended continuing Seroquel 200 MG HS for mood swings, trazodone 100 MG HS for insomnia, Depakote 1500  MG HS for mood swings/bipolar disorder, Haldol 5 MG every 6 hours when necessary for agitation and combative behaviors, Ativan was discontinued, periodically monitor EKG for QTC prolongation (QTC 432 ms by EKG on 6/11) and valproic acid levels (34 on 10/05/16). Per psychiatry, he did not meet criteria for acute psychiatric admission.   Psychiatric illness including bipolar disorder, depression, anxiety & PTSD:  - Continue management as indicated in problem #1 as per psychiatry recommendations. Currently stable without agitation.  - Resumed on Wellbutrin  Essential hypertension:  - Controlled on no medications currently. Amlodipine and propranolol were discontinued due to borderline low blood pressures.   DM type II, uncontrolled:  - Poorly controlled. A1c 9.4 on 06/02/16. Noncompliant with most aspects of diabetes care at home. CBG acceptable on current regimen of 18 units Lantus at bedtime, monitor CBGs and adjust Lantus as needed  Hypokalemia:  - Resolved.  Seizure disorder:  - Valproic acid level 34. No reports of recurrent seizures.  Stage III chronic  kidney disease: -  Baseline creatinine appears to be in the 2.2-2.7 range. Euvolemic clinically.   Falls:  - History of frequent falls. Evaluated by neurology who recommended MRI brain and spine and findings of that were unremarkable for cause. Truvada discontinued in case it was contributing to his neuropathy. Neurology recommended outpatient follow-up for EMG/neuropsychiatric/neurocognitive testing . PT evaluation 6/15 >recommends ALF. Social worker exploring options.  Acute urinary retention:  - Noted on 09/09/16. Flomax started 5/25 and urinary retention resolved.  Escherichia coli Acute lower UTI:  - Felt to be CAUTI related to recent Foley catheterization for UTI. Completed course of ceriaxone. Treated.  Peripheral neuropathy - chronic bilateral feet numbness, continue with Neurontin - Continue with W-09 and folic acid supplements  HIV screening:  - Nonreactive  Chronic thrombocytopenia:  - Low platelet count noted dating back to 2017. Platelet count had normalized   Constipation/hemorrhoids:  - reports constipation, rectal pain without bleeding. Bowel regimen started. Improved.  BPH - He is on Flomax, report some weak stream, increased his dose to 0.8 mg at bedtime   Discharge Condition:  Stable   Follow UP  Follow-up Information    [facility] Follow up.   Contact information: [place] [address] [phone]             Discharge Instructions  and  Discharge Medications    Discharge Instructions    Discharge instructions    Complete by:  As directed    Follow with Primary MD [facility] in 7 days   Get CBC, CMP,  checked  by Primary MD next visit.    Activity: As tolerated with Full fall precautions use walker/cane & assistance as needed   Disposition ALF   Diet: Carb modified , with feeding assistance and aspiration precautions.   On your next visit with your primary care physician please Get Medicines reviewed and  adjusted.   Please request your Prim.MD to go over all Hospital Tests and Procedure/Radiological results at the follow up, please get all Hospital records sent to your Prim MD by signing hospital release before you go home.   If you experience worsening of your admission symptoms, develop shortness of breath, life threatening emergency, suicidal or homicidal thoughts you must seek medical attention immediately by calling 911 or calling your MD immediately  if symptoms less severe.  You Must read complete instructions/literature along with all the possible adverse reactions/side effects for all the Medicines you take and that have been prescribed  to you. Take any new Medicines after you have completely understood and accpet all the possible adverse reactions/side effects.   Do not drive, operating heavy machinery, perform activities at heights, swimming or participation in water activities or provide baby sitting services if your were admitted for syncope or siezures until you have seen by Primary MD or a Neurologist and advised to do so again.  Do not drive when taking Pain medications.    Do not take more than prescribed Pain, Sleep and Anxiety Medications  Special Instructions: If you have smoked or chewed Tobacco  in the last 2 yrs please stop smoking, stop any regular Alcohol  and or any Recreational drug use.  Wear Seat belts while driving.   Please note  You were cared for by a hospitalist during your hospital stay. If you have any questions about your discharge medications or the care you received while you were in the hospital after you are discharged, you can call the unit and asked to speak with the hospitalist on call if the hospitalist that took care of you is not available. Once you are discharged, your primary care physician will handle any further medical issues. Please note that NO REFILLS for any discharge medications will be authorized once you are discharged, as it is  imperative that you return to your primary care physician (or establish a relationship with a primary care physician if you do not have one) for your aftercare needs so that they can reassess your need for medications and monitor your lab values.   Increase activity slowly    Complete by:  As directed      Allergies as of 10/13/2016      Reactions   Vicodin [hydrocodone-acetaminophen] Itching      Medication List    STOP taking these medications   amLODipine 5 MG tablet Commonly known as:  NORVASC   benztropine 0.5 MG tablet Commonly known as:  COGENTIN   insulin lispro 100 UNIT/ML injection Commonly known as:  HUMALOG   insulin lispro 100 UNIT/ML KiwkPen Commonly known as:  HUMALOG KWIKPEN   Insulin Pen Needle 31G X 8 MM Misc Commonly known as:  B-D ULTRAFINE III SHORT PEN   losartan-hydrochlorothiazide 100-12.5 MG tablet Commonly known as:  HYZAAR   oxyCODONE-acetaminophen 5-325 MG tablet Commonly known as:  PERCOCET/ROXICET   prazosin 1 MG capsule Commonly known as:  MINIPRESS   propranolol 20 MG tablet Commonly known as:  INDERAL   testosterone cypionate 200 MG/ML injection Commonly known as:  DEPOTESTOSTERONE CYPIONATE   TRUVADA 200-300 MG tablet Generic drug:  emtricitabine-tenofovir     TAKE these medications   ACCU-CHEK GUIDE w/Device Kit 1 Device by Does not apply route daily. Use to check BS twice a day   ACCU-CHEK SOFTCLIX LANCETS lancets Use to check blood sugars twice a day   acetaminophen 325 MG tablet Commonly known as:  TYLENOL Take 2 tablets (650 mg total) by mouth every 6 (six) hours as needed for mild pain or fever.   atorvastatin 10 MG tablet Commonly known as:  LIPITOR Take 1 tablet (10 mg total) by mouth daily.   buPROPion 300 MG 24 hr tablet Commonly known as:  WELLBUTRIN XL Take 1 tablet (300 mg total) by mouth daily. What changed:  Another medication with the same name was removed. Continue taking this medication, and follow the  directions you see here.   clonazePAM 1 MG tablet Commonly known as:  KLONOPIN Take 1 tablet (1 mg total)  by mouth 2 (two) times daily as needed (anxiety). What changed:  medication strength  See the new instructions.   cyanocobalamin 500 MCG tablet Take 1 tablet (500 mcg total) by mouth daily.   divalproex 500 MG DR tablet Commonly known as:  DEPAKOTE Take 3 tablets (1,500 mg total) by mouth at bedtime.   docusate sodium 100 MG capsule Commonly known as:  COLACE Take 1 capsule (100 mg total) by mouth 2 (two) times daily.   Eszopiclone 3 MG Tabs Take 3 mg by mouth at bedtime as needed (sleep). Take immediately before bedtime   feeding supplement (GLUCERNA SHAKE) Liqd Take 237 mLs by mouth daily.   folic acid 1 MG tablet Commonly known as:  FOLVITE Take 1 tablet (1 mg total) by mouth daily. Start taking on:  10/14/2016   glucose blood test strip Commonly known as:  ACCU-CHEK GUIDE 1 each by Other route 2 (two) times daily. Use to check blood sugars twice a day   insulin glargine 100 UNIT/ML injection Commonly known as:  LANTUS Inject 0.22 mLs (22 Units total) into the skin at bedtime.   methocarbamol 500 MG tablet Commonly known as:  ROBAXIN Take 1 tablet (500 mg total) by mouth 2 (two) times daily.   polyethylene glycol packet Commonly known as:  MIRALAX / GLYCOLAX Take 17 g by mouth daily.   potassium chloride SA 20 MEQ tablet Commonly known as:  K-DUR,KLOR-CON Take 1 tablet (20 mEq total) by mouth daily. Start taking on:  10/14/2016   QUEtiapine 400 MG tablet Commonly known as:  SEROQUEL Take 1 tablet (400 mg total) by mouth at bedtime.   tamsulosin 0.4 MG Caps capsule Commonly known as:  FLOMAX Take 2 capsules (0.8 mg total) by mouth at bedtime.   Testosterone 20.25 MG/1.25GM (1.62%) Gel Commonly known as:  ANDROGEL Apply 20.25 mg topically 3 (three) times daily. Place the topically solution under (1) arm pit alternating with each application. Total  daily amount of medication equals (60.75 mg).   traMADol 50 MG tablet Commonly known as:  ULTRAM Take 1 tablet (50 mg total) by mouth every 8 (eight) hours as needed (pain).   traZODone 150 MG tablet Commonly known as:  DESYREL Take 1 tablet (150 mg total) by mouth at bedtime. What changed:  Another medication with the same name was removed. Continue taking this medication, and follow the directions you see here.            Durable Medical Equipment        Start     Ordered   10/13/16 1018  For home use only DME Walker rolling  Once    Question:  Patient needs a walker to treat with the following condition  Answer:  Dementia   10/13/16 1018        Diet and Activity recommendation: See Discharge Instructions above   Consults obtained -  Psychiatry  Neurology   Major procedures and Radiology Reports - PLEASE review detailed and final reports for all details, in brief -     Dg Chest Port 1 View  Result Date: 09/15/2016 CLINICAL DATA:  51 year old male with fever and chest pain. EXAM: PORTABLE CHEST 1 VIEW COMPARISON:  Chest radiograph dated 09/07/2016 FINDINGS: The heart size and mediastinal contours are within normal limits. Both lungs are clear. The visualized skeletal structures are unremarkable. IMPRESSION: No active disease. Electronically Signed   By: Anner Crete M.D.   On: 09/15/2016 22:54    Micro Results  No results found for this or any previous visit (from the past 240 hour(s)).     Today   Subjective:   [name] today has no headache,no chest or abdominal pain,no new weakness tingling or numbness  Objective:   Blood pressure 121/83, pulse 74, temperature 97.3 F (36.3 C), temperature source Oral, resp. rate 17, height 6' 4"  (1.93 m), weight 97.1 kg (214 lb 1.1 oz), SpO2 98 %.  No intake or output data in the 24 hours ending 10/13/16 1153  Exam Awake Alert, Oriented x 3, Intact as well as insight,  flat affect Symmetrical Chest wall  movement, Good air movement bilaterally, CTAB RRR,No Gallops,Rubs or new Murmurs, No Parasternal Heave +ve B.Sounds, Abd Soft, Non tender, No rebound -guarding or rigidity. No Cyanosis, Clubbing or edema, No new Rash or bruise  Data Review   CBC w Diff:  Lab Results  Component Value Date   WBC 4.6 10/11/2016   HGB 12.6 (L) 10/11/2016   HCT 37.7 (L) 10/11/2016   PLT 93 (L) 10/11/2016   LYMPHOPCT 44 09/25/2016   MONOPCT 11 09/25/2016   EOSPCT 1 09/25/2016   BASOPCT 0 09/25/2016    CMP:  Lab Results  Component Value Date   NA 138 10/11/2016   K 4.5 10/11/2016   CL 101 10/11/2016   CO2 30 10/11/2016   BUN 19 10/11/2016   CREATININE 2.15 (H) 10/11/2016   PROT 6.2 (L) 10/05/2016   ALBUMIN 3.1 (L) 10/05/2016   BILITOT 0.4 10/05/2016   ALKPHOS 66 10/05/2016   AST 13 (L) 10/05/2016   ALT 9 (L) 10/05/2016  .   Total Time in preparing paper work, data evaluation and todays exam - 35 minutes  Deah Ottaway M.D on 10/13/2016 at 11:53 AM  [facility]   Office  [phone]

## 2016-10-13 NOTE — Discharge Instructions (Signed)
Follow with Primary MD [facility] in 7 days   Get CBC, CMP,  checked  by Primary MD next visit.    Activity: As tolerated with Full fall precautions use walker/cane & assistance as needed   Disposition ALF   Diet: Carb modified , with feeding assistance and aspiration precautions.   On your next visit with your primary care physician please Get Medicines reviewed and adjusted.   Please request your Prim.MD to go over all Hospital Tests and Procedure/Radiological results at the follow up, please get all Hospital records sent to your Prim MD by signing hospital release before you go home.   If you experience worsening of your admission symptoms, develop shortness of breath, life threatening emergency, suicidal or homicidal thoughts you must seek medical attention immediately by calling 911 or calling your MD immediately  if symptoms less severe.  You Must read complete instructions/literature along with all the possible adverse reactions/side effects for all the Medicines you take and that have been prescribed to you. Take any new Medicines after you have completely understood and accpet all the possible adverse reactions/side effects.   Do not drive, operating heavy machinery, perform activities at heights, swimming or participation in water activities or provide baby sitting services if your were admitted for syncope or siezures until you have seen by Primary MD or a Neurologist and advised to do so again.  Do not drive when taking Pain medications.    Do not take more than prescribed Pain, Sleep and Anxiety Medications  Special Instructions: If you have smoked or chewed Tobacco  in the last 2 yrs please stop smoking, stop any regular Alcohol  and or any Recreational drug use.  Wear Seat belts while driving.   Please note  You were cared for by a hospitalist during your hospital stay. If you have any questions about your discharge medications or the care you received while you were  in the hospital after you are discharged, you can call the unit and asked to speak with the hospitalist on call if the hospitalist that took care of you is not available. Once you are discharged, your primary care physician will handle any further medical issues. Please note that NO REFILLS for any discharge medications will be authorized once you are discharged, as it is imperative that you return to your primary care physician (or establish a relationship with a primary care physician if you do not have one) for your aftercare needs so that they can reassess your need for medications and monitor your lab values.

## 2016-10-13 NOTE — [place] FL2 (Signed)
[place] MEDICAID FL2 LEVEL OF CARE SCREENING TOOL     IDENTIFICATION  Patient Name: [name] Birthdate: June 10, 1965 Sex: male Admission Date (Current Location): 09/07/2016  [facility] and [place] Number:  Herbalist and Address:  The [place], [place] [address] [place], [place] [address]      Provider Number: [id number]  Attending Physician Name and Address:  Albertine Patricia, MD  Relative Name and Phone Number:  [name] sister, [phone]    Current Level of Care: Hospital Recommended Level of Care: [place] Prior Approval Number:    Date Approved/Denied:   PASRR Number: [id number]  Discharge Plan: Other (Comment) (ALF)    Current Diagnoses: Patient Active Problem List   Diagnosis Date Noted  . Constipation   . History of seizures 09/20/2016  . Dementia, early onset with advanced brain atrophy for age 51/05/2016  . Acute lower UTI   . Acute urinary retention   . Acute metabolic encephalopathy 86/57/8469  . HLD (hyperlipidemia) 09/07/2016  . GERD (gastroesophageal reflux disease) 09/07/2016  . PTSD (post-traumatic stress disorder) 09/07/2016  . Acute encephalopathy 09/07/2016  . Fall 09/07/2016  . CRI (chronic renal insufficiency), stage 3 (moderate)   . Ataxia 10/14/2015  . Hypokalemia 10/03/2015  . Hemiparesis ([place])   . Dyslipidemia 05/31/2014  . Insomnia 11/11/2013  . Degenerative joint disease (DJD) of hip 08/16/2013  . Erectile dysfunction 02/17/2011  . Constipation - functional 02/17/2011  . Diabetes type 2, uncontrolled ([place]) 11/06/2010  . Hypogonadism male 05/20/2010  . TOBACCO USER 05/20/2010  . Demoralization and apathy 05/20/2010  . Bipolar disorder ([place]) 01/18/2010  . Depression 01/18/2010  . Anxiety state 12/05/2006  . Essential hypertension 12/05/2006    Orientation RESPIRATION BLADDER Height & Weight     Self, Time, Situation, Place  Normal Continent Weight: 97.1 kg (214 lb 1.1  oz) Height:  6\' 4"  (193 cm)  BEHAVIORAL SYMPTOMS/MOOD NEUROLOGICAL BOWEL NUTRITION STATUS      Continent Diet (Please see DC Summary)  AMBULATORY STATUS COMMUNICATION OF NEEDS Skin   Supervision Verbally Normal                       Personal Care Assistance Level of Assistance  Bathing, Feeding, Dressing Bathing Assistance: Limited assistance Feeding assistance: Independent Dressing Assistance: Independent     Functional Limitations Info             SPECIAL CARE FACTORS FREQUENCY  PT (By licensed PT)     PT Frequency: 3x/week OT Frequency: 3x/week            Contractures      Additional Factors Info  Code Status, Allergies, Psychotropic, Insulin Sliding Scale Code Status Info: Full Allergies Info: Vicodin Hydrocodone-acetaminophen Psychotropic Info: Depakote; Seroquel; Trazadone; Ativan Insulin Sliding Scale Info: 3x daily with meals       Current Medications (10/13/2016):  This is the current hospital active medication list Current Facility-Administered Medications  Medication Dose Route Frequency Provider Last Rate Last Dose  . acetaminophen (TYLENOL) tablet 650 mg  650 mg Oral Q6H PRN Rise Patience, MD   650 mg at 10/07/16 1703  . buPROPion (WELLBUTRIN XL) 24 hr tablet 300 mg  300 mg Oral Daily Elgergawy, Silver Huguenin, MD   300 mg at 10/12/16 1303  . clonazePAM (KLONOPIN) tablet 1 mg  1 mg Oral BID PRN Elgergawy, Silver Huguenin, MD   1 mg at 10/13/16 6295  . cyanocobalamin ((VITAMIN B-12)) injection  1,000 mcg  1,000 mcg Intramuscular Weekly Elgergawy, Silver Huguenin, MD   1,000 mcg at 10/11/16 0901  . cyclobenzaprine (FLEXERIL) tablet 5 mg  5 mg Oral TID PRN Elgergawy, Silver Huguenin, MD   5 mg at 10/13/16 0810  . divalproex (DEPAKOTE) DR tablet 1,500 mg  1,500 mg Oral QHS Ivor Costa, MD   1,500 mg at 10/12/16 2152  . docusate sodium (COLACE) capsule 100 mg  100 mg Oral BID Modena Jansky, MD   100 mg at 10/12/16 2155  . enoxaparin (LOVENOX) injection 40 mg  40 mg  Subcutaneous Q24H Elgergawy, Silver Huguenin, MD   40 mg at 10/12/16 0923  . folic acid (FOLVITE) tablet 1 mg  1 mg Oral Daily Amin, Ankit Chirag, MD   1 mg at 10/12/16 4098  . gabapentin (NEURONTIN) tablet 300 mg  300 mg Oral QHS Nita Sells, MD   300 mg at 10/12/16 2154  . haloperidol lactate (HALDOL) injection 5 mg  5 mg Intramuscular Q6H PRN Rosita Fire, MD      . hydrALAZINE (APRESOLINE) injection 5 mg  5 mg Intravenous Q2H PRN Ivor Costa, MD      . hydrocortisone (ANUSOL-HC) suppository 25 mg  25 mg Rectal Daily PRN Modena Jansky, MD   25 mg at 10/04/16 2009  . insulin aspart (novoLOG) injection 0-15 Units  0-15 Units Subcutaneous TID WC Modena Jansky, MD   3 Units at 10/13/16 [phone]  . insulin aspart (novoLOG) injection 0-5 Units  0-5 Units Subcutaneous QHS Modena Jansky, MD   2 Units at 10/12/16 2153  . insulin glargine (LANTUS) injection 22 Units  22 Units Subcutaneous QHS Elgergawy, Silver Huguenin, MD   22 Units at 10/12/16 2152  . ondansetron (ZOFRAN) tablet 4 mg  4 mg Oral Q6H PRN Ivor Costa, MD       Or  . ondansetron (ZOFRAN) injection 4 mg  4 mg Intravenous Q6H PRN Ivor Costa, MD      . polyethylene glycol (MIRALAX / GLYCOLAX) packet 17 g  17 g Oral BID Modena Jansky, MD   17 g at 10/12/16 2152  . potassium chloride SA (K-DUR,KLOR-CON) CR tablet 20 mEq  20 mEq Oral Daily Rama, Venetia Maxon, MD   20 mEq at 10/12/16 4782  . QUEtiapine (SEROQUEL) tablet 200 mg  200 mg Oral QHS Amin, Ankit Chirag, MD   200 mg at 10/12/16 2155  . tamsulosin (FLOMAX) capsule 0.8 mg  0.8 mg Oral QHS Elgergawy, Silver Huguenin, MD      . testosterone (ANDROGEL) 50 MG/5GM (1%) gel 5 g  5 g Transdermal Daily Rosita Fire, MD   5 g at 10/12/16 1127  . traMADol (ULTRAM) tablet 50 mg  50 mg Oral Q8H PRN Elgergawy, Silver Huguenin, MD   50 mg at 10/13/16 0811  . traZODone (DESYREL) tablet 100 mg  100 mg Oral QHS Amin, Ankit Chirag, MD   100 mg at 10/12/16 2152     Discharge Medications: Please  see discharge summary for a list of discharge medications.  Relevant Imaging Results:  Relevant Lab Results:   Additional Information SSN [id number]  Benard Halsted, LCSWA

## 2016-10-15 NOTE — Telephone Encounter (Signed)
Patient would like to know if this can be faxed over to him at [phone].   Patient can be reached on cell.

## 2016-10-15 NOTE — Telephone Encounter (Signed)
Patient has called back in.  He is in [facility] right now in [place],.  This is where he would like forms faxed to him at along with script. Patient states he could be standing up and pee will run down his leg.  States that when he has a BM he can not control his urine either.

## 2016-10-16 NOTE — Telephone Encounter (Signed)
Pt called in and said that he would like to know if DR plot could call in his [place] for his urinary issue.  He stated that he is is urinating on himself and in the bed because he doesn't have this med.  He wants to know what can be done?  Can we send there to help    Fax number [phone] Pt number  [facility] Address: [address] [place], [place] [address]  Phone: [phone]

## 2016-10-17 NOTE — Telephone Encounter (Signed)
Pt called again about this, he said that he willl be sent to the pharmacy and the place will despence any meds to pt

## 2017-02-02 ENCOUNTER — Emergency Department (HOSPITAL_COMMUNITY)
Admission: EM | Admit: 2017-02-02 | Discharge: 2017-02-02 | Disposition: A | Discharge status: Home / Self Care | Payer: Medicaid Other | Attending: Emergency Medicine | Admitting: Emergency Medicine

## 2017-02-02 ENCOUNTER — Encounter (HOSPITAL_COMMUNITY): Payer: Self-pay | Admitting: Emergency Medicine

## 2017-02-02 ENCOUNTER — Emergency Department (HOSPITAL_COMMUNITY): Payer: Medicaid Other

## 2017-02-02 DIAGNOSIS — F0390 Unspecified dementia without behavioral disturbance: Secondary | ICD-10-CM | POA: Insufficient documentation

## 2017-02-02 DIAGNOSIS — Z794 Long term (current) use of insulin: Secondary | ICD-10-CM | POA: Diagnosis not present

## 2017-02-02 DIAGNOSIS — Z87891 Personal history of nicotine dependence: Secondary | ICD-10-CM | POA: Insufficient documentation

## 2017-02-02 DIAGNOSIS — Z79899 Other long term (current) drug therapy: Secondary | ICD-10-CM | POA: Diagnosis not present

## 2017-02-02 DIAGNOSIS — N183 Chronic kidney disease, stage 3 (moderate): Secondary | ICD-10-CM | POA: Insufficient documentation

## 2017-02-02 DIAGNOSIS — G8929 Other chronic pain: Secondary | ICD-10-CM | POA: Diagnosis not present

## 2017-02-02 DIAGNOSIS — E1122 Type 2 diabetes mellitus with diabetic chronic kidney disease: Secondary | ICD-10-CM | POA: Diagnosis not present

## 2017-02-02 DIAGNOSIS — M25511 Pain in right shoulder: Secondary | ICD-10-CM | POA: Diagnosis not present

## 2017-02-02 DIAGNOSIS — I129 Hypertensive chronic kidney disease with stage 1 through stage 4 chronic kidney disease, or unspecified chronic kidney disease: Secondary | ICD-10-CM | POA: Diagnosis not present

## 2017-02-02 LAB — CBG MONITORING, ED: Glucose-Capillary: 187 mg/dL — ABNORMAL HIGH (ref 65–99)

## 2017-02-02 MED ORDER — ACCU-CHEK SOFTCLIX LANCETS MISC: 0 refills | Status: DC

## 2017-02-02 MED ORDER — CYCLOBENZAPRINE HCL 5 MG PO TABS: 5.0000 mg | ORAL_TABLET | Freq: Two times a day (BID) | ORAL | 0 refills | Status: DC | PRN

## 2017-02-02 MED ORDER — INSULIN GLARGINE 100 UNIT/ML ~~LOC~~ SOLN: 18.0000 [IU] | Freq: Every day | SUBCUTANEOUS | 1 refills | Status: DC

## 2017-02-02 NOTE — ED Notes (Signed)
ED Provider at bedside. 

## 2017-02-02 NOTE — ED Provider Notes (Signed)
[facility] EMERGENCY DEPARTMENT Provider Note   CSN: [id number] Arrival date & time: 02/02/17  1445     History   Chief Complaint Chief Complaint  Patient presents with  . Fall  . Shoulder Pain    HPI [name] is a 51 y.o. male with a history of early onset dementia who presents to the emergency department with a chief complaint of right shoulder plain that began after a mechanical fall 1 month ago. He denies new injury or trauma. No previous surgery to the right shoulder. He reports worsening pain with ROM of the shoulder. No numbness or weakness. He reports a history of chronic low back pain  He reports that he currently does not have medical insurance and has been out of his Lantus for the last week. He reports that he has not been checking his blood sugar regularly. He reports that he is trying to get established with [facility] and wellness, but states that he continues to call and is unable to get set up an appointment. He reports that he is established with [facility] who provides some of his medications per behavioral health.  Ambulates with a cane at baseline. Lives at home with his sister.  Level V caveat secondary to the patient's history of dementia. The patient is a poor historian.   The history is provided by the patient. No language interpreter was used.    Past Medical History:  Diagnosis Date  . ADD (attention deficit disorder)   . Anxiety   . Arthritis    right hip  . Bipolar 1 disorder ([place])   . Blood in urine   . CKD (chronic kidney disease), stage III ([place])   . Creatinine elevation   . Depression    bipolar [facility]  . Diabetes mellitus without complication ([place])   . Family history of anesthesia complication    pt is unsure , but pt father may have been difficult to arouse   . HCAP (healthcare-associated pneumonia) 10/31/2012  . History of kidney stones   . Hypertension   . Hypogonadism male   . Liver fatty degeneration    . Microscopic hematuria    hereditary s/p Urology eval  . Osteoarthritis of right hip 11/28/2011   2012 2015 s/p THR Severe  Dr Novella Olive    . Pleural effusion 11/02/2012  . Pneumonia 10-2012  . Pneumonia, organism unspecified(486) 11/02/2012  . Polysubstance dependence, non-opioid, in remission ([place])    remote  . Primary osteoarthritis of left hip 05/22/2015  . PTSD (post-traumatic stress disorder)    SOCIAL ANXIETY DISORDER   . Suicide attempt by multiple drug overdose ([place]) January 14, 2016   Grieving his cat's death 07-04-15    Patient Active Problem List   Diagnosis Date Noted  . Constipation   . History of seizures 09/20/2016  . Dementia, early onset with advanced brain atrophy for age 39/05/2016  . Acute lower UTI   . Acute urinary retention   . Acute metabolic encephalopathy 29/79/8921  . HLD (hyperlipidemia) 09/07/2016  . GERD (gastroesophageal reflux disease) 09/07/2016  . PTSD (post-traumatic stress disorder) 09/07/2016  . Acute encephalopathy 09/07/2016  . Fall 09/07/2016  . CRI (chronic renal insufficiency), stage 3 (moderate) (H[facility])   . Ataxia 10/14/2015  . Hypokalemia 10/03/2015  . Hemiparesis ([place])   . Dyslipidemia 05/31/2014  . Insomnia 11/11/2013  . Degenerative joint disease (DJD) of hip 08/16/2013  . Erectile dysfunction 02/17/2011  . Constipation - functional 02/17/2011  . Diabetes type  2, uncontrolled ([place]) 11/06/2010  . Hypogonadism male 05/20/2010  . TOBACCO USER 05/20/2010  . Demoralization and apathy 05/20/2010  . Bipolar disorder ([place]) 01/18/2010  . Depression 01/18/2010  . Anxiety state 12/05/2006  . Essential hypertension 12/05/2006    Past Surgical History:  Procedure Laterality Date  . BACK SURGERY    . CLOSED REDUCTION METACARPAL WITH PERCUTANEOUS PINNING Right   . LUMBAR DISC SURGERY    . TONSILLECTOMY    . TOTAL HIP ARTHROPLASTY Right 08/16/2013   Procedure: TOTAL HIP ARTHROPLASTY ANTERIOR APPROACH;  Surgeon: Hessie Dibble, MD;  Location: [facility];  Service: Orthopedics;  Laterality: Right;  . TOTAL HIP ARTHROPLASTY Left 05/22/2015   Procedure: TOTAL HIP ARTHROPLASTY ANTERIOR APPROACH;  Surgeon: Melrose Nakayama, MD;  Location: [facility];  Service: Orthopedics;  Laterality: Left;       Home Medications    Prior to Admission medications   Medication Sig Start Date End Date Taking? Authorizing Provider  ACCU-CHEK SOFTCLIX LANCETS lancets Use to check blood sugars twice a day 02/02/17   Halana Deisher A, PA-C  acetaminophen (TYLENOL) 325 MG tablet Take 2 tablets (650 mg total) by mouth every 6 (six) hours as needed for mild pain or fever. 10/13/16   Elgergawy, Silver Huguenin, MD  atorvastatin (LIPITOR) 10 MG tablet Take 1 tablet (10 mg total) by mouth daily. 10/13/16   Elgergawy, Silver Huguenin, MD  Blood Glucose Monitoring Suppl (ACCU-CHEK GUIDE) w/Device KIT 1 Device by Does not apply route daily. Use to check BS twice a day 06/25/16   Plotnikov, Evie Lacks, MD  buPROPion (WELLBUTRIN XL) 300 MG 24 hr tablet Take 1 tablet (300 mg total) by mouth daily. 10/13/16   Elgergawy, Silver Huguenin, MD  clonazePAM (KLONOPIN) 1 MG tablet Take 1 tablet (1 mg total) by mouth 2 (two) times daily as needed (anxiety). 10/13/16   Elgergawy, Silver Huguenin, MD  cyanocobalamin 500 MCG tablet Take 1 tablet (500 mcg total) by mouth daily. 10/13/16   Elgergawy, Silver Huguenin, MD  cyclobenzaprine (FLEXERIL) 5 MG tablet Take 1 tablet (5 mg total) by mouth 2 (two) times daily as needed for muscle spasms. 02/02/17   Terika Pillard A, PA-C  divalproex (DEPAKOTE) 500 MG DR tablet Take 3 tablets (1,500 mg total) by mouth at bedtime. 10/13/16 10/13/17  Elgergawy, Silver Huguenin, MD  docusate sodium (COLACE) 100 MG capsule Take 1 capsule (100 mg total) by mouth 2 (two) times daily. 10/13/16   Elgergawy, Silver Huguenin, MD  Eszopiclone 3 MG TABS Take 3 mg by mouth at bedtime as needed (sleep). Take immediately before bedtime    [provider]  feeding supplement, GLUCERNA SHAKE, (GLUCERNA SHAKE) LIQD Take 237 mLs by  mouth daily. Patient not taking: Reported on 09/07/2016 10/05/15   Florencia Reasons, MD  folic acid (FOLVITE) 1 MG tablet Take 1 tablet (1 mg total) by mouth daily. 10/14/16   Elgergawy, Silver Huguenin, MD  glucose blood (ACCU-CHEK GUIDE) test strip 1 each by Other route 2 (two) times daily. Use to check blood sugars twice a day 06/25/16   Plotnikov, Evie Lacks, MD  insulin glargine (LANTUS) 100 UNIT/ML injection Inject 0.18 mLs (18 Units total) into the skin at bedtime. 02/02/17   Gari Hartsell A, PA-C  methocarbamol (ROBAXIN) 500 MG tablet Take 1 tablet (500 mg total) by mouth 2 (two) times daily. 10/13/16   Elgergawy, Silver Huguenin, MD  polyethylene glycol (MIRALAX / GLYCOLAX) packet Take 17 g by mouth daily. 10/13/16   Elgergawy, Silver Huguenin,  MD  potassium chloride SA (K-DUR,KLOR-CON) 20 MEQ tablet Take 1 tablet (20 mEq total) by mouth daily. 10/14/16   Elgergawy, Silver Huguenin, MD  QUEtiapine (SEROQUEL) 400 MG tablet Take 1 tablet (400 mg total) by mouth at bedtime. 10/13/16   Elgergawy, Silver Huguenin, MD  tamsulosin (FLOMAX) 0.4 MG CAPS capsule Take 2 capsules (0.8 mg total) by mouth at bedtime. 10/13/16   Elgergawy, Silver Huguenin, MD  Testosterone (ANDROGEL) 20.25 MG/1.25GM (1.62%) GEL Apply 20.25 mg topically 3 (three) times daily. Place the topically solution under (1) arm pit alternating with each application. Total daily amount of medication equals (60.75 mg). 06/27/16   Plotnikov, Evie Lacks, MD  traMADol (ULTRAM) 50 MG tablet Take 1 tablet (50 mg total) by mouth every 8 (eight) hours as needed (pain). 10/13/16   Elgergawy, Silver Huguenin, MD  traZODone (DESYREL) 150 MG tablet Take 1 tablet (150 mg total) by mouth at bedtime. 10/13/16   Elgergawy, Silver Huguenin, MD    Family History Family History  Problem Relation Age of Onset  . Diabetes Father   . Cancer Mother        died of melanoma with mets  . Cervical cancer Sister   . Diabetes Sister   . Other Neg Hx        hypogonadism    Social History Social History  Substance Use Topics  .  Smoking status: Former Smoker    Years: 0.00    Types: Cigarettes    Quit date: 02/19/2014  . Smokeless tobacco: Never Used  . Alcohol use No     Allergies   Vicodin [hydrocodone-acetaminophen]   Review of Systems Review of Systems  Constitutional: Negative for activity change.  Respiratory: Negative for shortness of breath.   Cardiovascular: Negative for chest pain.  Gastrointestinal: Negative for abdominal pain.  Musculoskeletal: Positive for arthralgias, back pain (chronic), gait problem (chronic) and myalgias.  Skin: Negative for rash.   Physical Exam Updated Vital Signs BP 122/82 (BP Location: Right Arm)   Pulse 84   Temp 98.3 F (36.8 C) (Oral)   Resp 18   SpO2 98%   Physical Exam  Constitutional: He appears well-developed.  HENT:  Head: Normocephalic.  Eyes: Conjunctivae are normal.  Neck: Neck supple.  Cardiovascular: Normal rate, regular rhythm and normal heart sounds.  Exam reveals no gallop and no friction rub.   No murmur heard. Pulmonary/Chest: Effort normal and breath sounds normal. No respiratory distress. He has no wheezes. He has no rales.  Abdominal: Soft. He exhibits no distension.  Musculoskeletal: He exhibits no edema or deformity.  Decreased active range of motion of the right shoulder. Full range of motion of the right elbow and wrist. Normal left shoulder exam. Radial pulses are 2+ bilaterally. Sensation is intact throughout the bilateral upper extremities. 5 out of 5 grip strength and strength against resistance of the bilateral upper extremities.  Diffuse tenderness to palpation throughout the cervical, thoracic, or lumbar spinous processes and surrounding paraspinal muscles.  The patient is ambulatory and able to bear weight on the bilateral lower extremities. Unsteady, antalgic gait.  Neurological: He is alert.  Skin: Skin is warm and dry.  Psychiatric: His behavior is normal.  Nursing note and vitals reviewed.    ED Treatments /  Results  Labs (all labs ordered are listed, but only abnormal results are displayed) Labs Reviewed  CBG MONITORING, ED - Abnormal; Notable for the following:       Result Value   Glucose-Capillary 187 (*)  All other components within normal limits    EKG  EKG Interpretation None       Radiology Dg Shoulder Right  Result Date: 02/02/2017 CLINICAL DATA:  [place] twice at a rehab center landing on RIGHT side, RIGHT shoulder pain, low back pain, history dementia EXAM: RIGHT SHOULDER - 2+ VIEW COMPARISON:  07/08/2016 FINDINGS: Low normal osseous mineralization. Mild degenerative changes at RIGHT [facility] joint. No acute fracture, dislocation, or bone destruction. Visualized RIGHT ribs intact. IMPRESSION: Mild RIGHT AC joint degenerative changes. No acute bony abnormalities. Electronically Signed   By: Lavonia Dana M.D.   On: 02/02/2017 16:04    Procedures Procedures (including critical care time)  Medications Ordered in ED Medications - No data to display   Initial Impression / Assessment and Plan / ED Course  I have reviewed the triage vital signs and the nursing notes.  Pertinent labs & imaging results that were available during my care of the patient were reviewed by me and considered in my medical decision making (see chart for details).     51 year old male with multiple medical problems presenting with right shoulder pain that began after a fall 4 weeks ago. X-ray demonstrating mild right before meals joint degenerative changes. Decreased range of motion of the right shoulder secondary to pain. Neurovascularly intact. The patient has a history of chronic cervical and lumbar pain. MRIs were performed during the patient's most recent inpatient stay in June 2018. The patient does not have medical insurance and has not followed up with neurosurgery or orthopaedics due to not having medical insurance. He reports that he has been trying to get established with [facility] and wellness but  has been unable to secure an appointment. Consult to social work and spoke with Mariann Laster who states that the patient is indeed established with [facility] and wellness and may get his medications refilled at the pharmacy. She has sent an email to the office to have them call the patient and get him set up with follow-up.   The patient reports he has been out of his Lantus for 1 week. POC CBG 187. Will refill the patient's Lantus for 1 month. Discussed the plan with the patient regarding outpatient follow-up and refilling his Lantus. He is agreeable at this time. Strict return precautions given. No acute distress. Vital signs stable. The patient is safe for discharge at this time.  Final Clinical Impressions(s) / ED Diagnoses   Final diagnoses:  Chronic right shoulder pain    New Prescriptions Discharge Medication List as of 02/02/2017  8:11 PM    START taking these medications   Details  cyclobenzaprine (FLEXERIL) 5 MG tablet Take 1 tablet (5 mg total) by mouth 2 (two) times daily as needed for muscle spasms., Starting Mon 02/02/2017, Print         Jaxx Huish A, PA-C 02/03/17 0151    Virgel Manifold, MD 02/03/17 1147

## 2017-02-02 NOTE — ED Notes (Signed)
Care management at bedside.

## 2017-02-02 NOTE — ED Triage Notes (Signed)
Pt arrives with a friend from home, pt has a history of early onset of dementia and able to tell me this history pt states last month he was at a rehab facility and when walking in his room tripped in the dark and landed onto right shoulder. Pt states he has never been seen by an MD after this fall and is still having right shoulder pain and pain in his back. Pt is now living with his sister. Pt is alert and ox4.

## 2017-02-02 NOTE — Discharge Instructions (Signed)
Please schedule to the pharmacy at [facility] and wellness to get your insulin filled.  Please call and schedule a follow-up with orthopedics regarding your shoulder. Flexeril may be taken up to 2 times per day to help with muscle pain and spasms. Please do not work or drive while using this medication because it can make you sleepy.

## 2017-02-02 NOTE — Care Management (Signed)
ED CM met with patient at bedside. Patient was placed on a 30 day LOG from [facility] to a SNF and was there over 60 days without payment. Patient was discharged from the facility. Patient was active with [facility], CM will arrange for patient to get a follow up a[ppointment. CM will notify clinic CM to follow up with patient.  Updated Mia PA-C with transitional care plan she is agreeable. No further ED CM needs.

## 2017-02-03 ENCOUNTER — Telehealth: Payer: Self-pay

## 2017-02-03 NOTE — Telephone Encounter (Signed)
Call placed to patient [phone] after message was received from CM at the hospital that patient needed a hospital follow up appointment here [facility]. Spoke with patient and he agreed on being seen tomorrow at 3:45pm. Patient was concerned that he doesn't have money and cannot afford to pay office visit. Patient has knowledge of the [employer] and I informed him that an appointment can be scheduled with our financial counselor after he has seen a PCP at our office. Explained to patient that once he has seen financial counselor, submits application and documents and get approved, the hospital then can waive the office visit fees depending on how much he gets approved for. Patient understood and had no further questions.

## 2017-02-04 ENCOUNTER — Ambulatory Visit: Payer: Medicaid Other | Attending: Family Medicine | Admitting: Family Medicine

## 2017-02-04 ENCOUNTER — Encounter: Payer: Self-pay | Admitting: Family Medicine

## 2017-02-04 VITALS — BP 124/68 | HR 94 | Temp 97.8°F | Ht 76.0 in | Wt 194.4 lb

## 2017-02-04 DIAGNOSIS — Z791 Long term (current) use of non-steroidal anti-inflammatories (NSAID): Secondary | ICD-10-CM | POA: Diagnosis not present

## 2017-02-04 DIAGNOSIS — Z87442 Personal history of urinary calculi: Secondary | ICD-10-CM | POA: Insufficient documentation

## 2017-02-04 DIAGNOSIS — E11649 Type 2 diabetes mellitus with hypoglycemia without coma: Secondary | ICD-10-CM

## 2017-02-04 DIAGNOSIS — F4310 Post-traumatic stress disorder, unspecified: Secondary | ICD-10-CM | POA: Insufficient documentation

## 2017-02-04 DIAGNOSIS — G8929 Other chronic pain: Secondary | ICD-10-CM | POA: Insufficient documentation

## 2017-02-04 DIAGNOSIS — F319 Bipolar disorder, unspecified: Secondary | ICD-10-CM | POA: Insufficient documentation

## 2017-02-04 DIAGNOSIS — I129 Hypertensive chronic kidney disease with stage 1 through stage 4 chronic kidney disease, or unspecified chronic kidney disease: Secondary | ICD-10-CM | POA: Insufficient documentation

## 2017-02-04 DIAGNOSIS — M549 Dorsalgia, unspecified: Secondary | ICD-10-CM | POA: Diagnosis not present

## 2017-02-04 DIAGNOSIS — F0391 Unspecified dementia with behavioral disturbance: Secondary | ICD-10-CM | POA: Insufficient documentation

## 2017-02-04 DIAGNOSIS — Z794 Long term (current) use of insulin: Secondary | ICD-10-CM | POA: Diagnosis not present

## 2017-02-04 DIAGNOSIS — E1122 Type 2 diabetes mellitus with diabetic chronic kidney disease: Secondary | ICD-10-CM | POA: Diagnosis not present

## 2017-02-04 DIAGNOSIS — M5136 Other intervertebral disc degeneration, lumbar region: Secondary | ICD-10-CM | POA: Insufficient documentation

## 2017-02-04 DIAGNOSIS — M48061 Spinal stenosis, lumbar region without neurogenic claudication: Secondary | ICD-10-CM | POA: Insufficient documentation

## 2017-02-04 DIAGNOSIS — G4709 Other insomnia: Secondary | ICD-10-CM

## 2017-02-04 DIAGNOSIS — M5126 Other intervertebral disc displacement, lumbar region: Secondary | ICD-10-CM | POA: Insufficient documentation

## 2017-02-04 DIAGNOSIS — G4700 Insomnia, unspecified: Secondary | ICD-10-CM | POA: Insufficient documentation

## 2017-02-04 DIAGNOSIS — F988 Other specified behavioral and emotional disorders with onset usually occurring in childhood and adolescence: Secondary | ICD-10-CM | POA: Insufficient documentation

## 2017-02-04 DIAGNOSIS — F3175 Bipolar disorder, in partial remission, most recent episode depressed: Secondary | ICD-10-CM | POA: Diagnosis not present

## 2017-02-04 DIAGNOSIS — M25511 Pain in right shoulder: Secondary | ICD-10-CM | POA: Diagnosis not present

## 2017-02-04 DIAGNOSIS — N183 Chronic kidney disease, stage 3 (moderate): Secondary | ICD-10-CM | POA: Diagnosis not present

## 2017-02-04 DIAGNOSIS — R270 Ataxia, unspecified: Secondary | ICD-10-CM | POA: Diagnosis not present

## 2017-02-04 DIAGNOSIS — E1169 Type 2 diabetes mellitus with other specified complication: Secondary | ICD-10-CM | POA: Insufficient documentation

## 2017-02-04 DIAGNOSIS — F418 Other specified anxiety disorders: Secondary | ICD-10-CM | POA: Diagnosis not present

## 2017-02-04 DIAGNOSIS — M51369 Other intervertebral disc degeneration, lumbar region without mention of lumbar back pain or lower extremity pain: Secondary | ICD-10-CM | POA: Insufficient documentation

## 2017-02-04 LAB — POCT GLYCOSYLATED HEMOGLOBIN (HGB A1C): Hemoglobin A1C: 6.8

## 2017-02-04 LAB — GLUCOSE, POCT (MANUAL RESULT ENTRY): POC GLUCOSE: 133 mg/dL — AB (ref 70–99)

## 2017-02-04 MED ORDER — TAMSULOSIN HCL 0.4 MG PO CAPS: 0.4000 mg | ORAL_CAPSULE | Freq: Every day | ORAL | 3 refills | Status: DC

## 2017-02-04 MED ORDER — ATORVASTATIN CALCIUM 10 MG PO TABS: 10.0000 mg | ORAL_TABLET | Freq: Every day | ORAL | 5 refills | Status: DC

## 2017-02-04 MED ORDER — DICLOFENAC SODIUM 1 % TD GEL: 4.0000 g | Freq: Four times a day (QID) | TRANSDERMAL | 1 refills | Status: DC

## 2017-02-04 MED ORDER — TADALAFIL 5 MG PO TABS: 5.0000 mg | ORAL_TABLET | Freq: Every day | ORAL | 0 refills | Status: DC | PRN

## 2017-02-04 MED ORDER — METHOCARBAMOL 500 MG PO TABS: 500.0000 mg | ORAL_TABLET | Freq: Two times a day (BID) | ORAL | 2 refills | Status: DC

## 2017-02-04 MED FILL — TAMSULOSIN HCL 0.4 MG CAP: 0.4 | 30 days supply | Qty: 30 | Fill #0

## 2017-02-04 MED FILL — DICLOFENAC SODIUM 1% GEL: 1 | 6 days supply | Qty: 100 | Fill #0

## 2017-02-04 MED FILL — !LANTUS 100 UNITS/ML VIAL: 100 | 30 days supply | Qty: 10 | Fill #0

## 2017-02-04 MED FILL — ?ATORVASTATIN 10 MG TABLET: 10 | 30 days supply | Qty: 30 | Fill #0

## 2017-02-04 MED FILL — METHOCARBAMOL 500 MG TABS: 500 | 30 days supply | Qty: 60 | Fill #0

## 2017-02-04 NOTE — Progress Notes (Signed)
Subjective:  Patient ID: [name], male    DOB: 1965-05-12  Age: 51 y.o. MRN: [id number]  CC: Hypertension; Diabetes; Back Pain; and Shoulder Pain   HPI [name] is a 51 year old male with a history of type 2 diabetes mellitus (A1c 6.8), bipolar disorder, degenerative disease of the lumbar spine, insomnia, early onset dementia who presents today to get established with me. He was previously followed by [facility] but he states his family wanted him to change his primary care physician.  He presents today complaining of right shoulder pain and worsening back pain ever since he took a fall 4 weeks ago at [facility] in [place] and was seen for this at the ED 2 days ago. Pain is 10/10 in both his R shoulder and back. He has restricted motion in his right shoulder and he previously had chronic back pain which has worsened. He has had previous lumbar spine surgery. He denies numbness in his upper extremity but does have chronic numbness in both feet. He endorses unstable gait and is unable to provide the duration of his symptoms due to his dementia but informs me that he is gait is wobbly and his equilibrium is off. He also has tremors.    Right shoulder x-ray from 01/2017 IMPRESSION: Mild RIGHT AC joint degenerative changes.  No acute bony abnormalities.   MRI spine from 07/2016: IMPRESSION: 1. Posterior and interbody fusion at L4-5 with laminectomy. No acute osseous abnormality identified. 2. Stable lumbar degenerative changes with multilevel mild foraminal narrowing and mild canal stenosis at the L2-3 and L3-4 levels. 3. Stable right subarticular disc extrusion with superior migration at L1-2 effacing the right lateral recess and impinging the exiting right L1 nerve root.  He receives his mental health care at [facility]. Denies suicidal ideations or intents.  Past Medical History:  Diagnosis Date  . ADD (attention  deficit disorder)   . Anxiety   . Arthritis    right hip  . Bipolar 1 disorder ([place])   . Blood in urine   . CKD (chronic kidney disease), stage III ([place])   . Creatinine elevation   . Depression    bipolar [facility]  . Diabetes mellitus without complication ([place])   . Family history of anesthesia complication    pt is unsure , but pt father may have been difficult to arouse   . HCAP (healthcare-associated pneumonia) 10/31/2012  . History of kidney stones   . Hypertension   . Hypogonadism male   . Liver fatty degeneration   . Microscopic hematuria    hereditary s/p Urology eval  . Osteoarthritis of right hip 11/28/2011   2012 2015 s/p THR Severe  Dr Novella Olive    . Pleural effusion 11/02/2012  . Pneumonia 10-2012  . Pneumonia, organism unspecified(486) 11/02/2012  . Polysubstance dependence, non-opioid, in remission ([place])    remote  . Primary osteoarthritis of left hip 05/22/2015  . PTSD (post-traumatic stress disorder)    SOCIAL ANXIETY DISORDER   . Suicide attempt by multiple drug overdose ([place]) January 10, 2016   Grieving his cat's death 06-30-2015    Past Surgical History:  Procedure Laterality Date  . BACK SURGERY    . CLOSED REDUCTION METACARPAL WITH PERCUTANEOUS PINNING Right   . LUMBAR DISC SURGERY    . TONSILLECTOMY    . TOTAL HIP ARTHROPLASTY Right 08/16/2013   Procedure: TOTAL HIP ARTHROPLASTY ANTERIOR APPROACH;  Surgeon: Hessie Dibble, MD;  Location: [facility];  Service: Orthopedics;  Laterality: Right;  . TOTAL HIP ARTHROPLASTY Left 05/22/2015   Procedure: TOTAL HIP ARTHROPLASTY ANTERIOR APPROACH;  Surgeon: Melrose Nakayama, MD;  Location: [facility];  Service: Orthopedics;  Laterality: Left;    Allergies  Allergen Reactions  . Vicodin [Hydrocodone-Acetaminophen] Itching     Outpatient Medications Prior to Visit  Medication Sig Dispense Refill  . acetaminophen (TYLENOL) 325 MG tablet Take 2 tablets (650 mg total) by mouth every 6 (six) hours as needed for mild pain or fever.      Marland Kitchen buPROPion (WELLBUTRIN XL) 300 MG 24 hr tablet Take 1 tablet (300 mg total) by mouth daily. 30 tablet 0  . cyclobenzaprine (FLEXERIL) 5 MG tablet Take 1 tablet (5 mg total) by mouth 2 (two) times daily as needed for muscle spasms. 14 tablet 0  . divalproex (DEPAKOTE) 500 MG DR tablet Take 3 tablets (1,500 mg total) by mouth at bedtime. 90 tablet 0  . docusate sodium (COLACE) 100 MG capsule Take 1 capsule (100 mg total) by mouth 2 (two) times daily. 10 capsule 0  . Eszopiclone 3 MG TABS Take 3 mg by mouth at bedtime as needed (sleep). Take immediately before bedtime    . glucose blood (ACCU-CHEK GUIDE) test strip 1 each by Other route 2 (two) times daily. Use to check blood sugars twice a day 150 each 3  . insulin glargine (LANTUS) 100 UNIT/ML injection Inject 0.18 mLs (18 Units total) into the skin at bedtime. 10 mL 1  . QUEtiapine (SEROQUEL) 400 MG tablet Take 1 tablet (400 mg total) by mouth at bedtime. 30 tablet 11  . Testosterone (ANDROGEL) 20.25 MG/1.25GM (1.62%) GEL Apply 20.25 mg topically 3 (three) times daily. Place the topically solution under (1) arm pit alternating with each application. Total daily amount of medication equals (60.75 mg). 150 g 5  . traZODone (DESYREL) 150 MG tablet Take 1 tablet (150 mg total) by mouth at bedtime. 30 tablet 1  . ACCU-CHEK SOFTCLIX LANCETS lancets Use to check blood sugars twice a day (Patient not taking: Reported on 02/04/2017) 200 each 0  . Blood Glucose Monitoring Suppl (ACCU-CHEK GUIDE) w/Device KIT 1 Device by Does not apply route daily. Use to check BS twice a day (Patient not taking: Reported on 02/04/2017) 1 kit 0  . cyanocobalamin 500 MCG tablet Take 1 tablet (500 mcg total) by mouth daily. (Patient not taking: Reported on 02/04/2017) 30 tablet 0  . feeding supplement, GLUCERNA SHAKE, (GLUCERNA SHAKE) LIQD Take 237 mLs by mouth daily. (Patient not taking: Reported on 08/29/2583) 30 Can 0  . folic acid (FOLVITE) 1 MG tablet Take 1 tablet (1 mg  total) by mouth daily. (Patient not taking: Reported on 02/04/2017) 30 tablet 0  . polyethylene glycol (MIRALAX / GLYCOLAX) packet Take 17 g by mouth daily. (Patient not taking: Reported on 02/04/2017) 14 each 0  . potassium chloride SA (K-DUR,KLOR-CON) 20 MEQ tablet Take 1 tablet (20 mEq total) by mouth daily. (Patient not taking: Reported on 02/04/2017) 30 tablet 0  . traMADol (ULTRAM) 50 MG tablet Take 1 tablet (50 mg total) by mouth every 8 (eight) hours as needed (pain). (Patient not taking: Reported on 02/04/2017) 20 tablet 0  . atorvastatin (LIPITOR) 10 MG tablet Take 1 tablet (10 mg total) by mouth daily. (Patient not taking: Reported on 02/04/2017) 30 tablet 0  . clonazePAM (KLONOPIN) 1 MG tablet Take 1 tablet (1 mg total) by mouth 2 (two) times daily as needed (anxiety). (Patient not taking: Reported on  02/04/2017) 20 tablet 0  . methocarbamol (ROBAXIN) 500 MG tablet Take 1 tablet (500 mg total) by mouth 2 (two) times daily. (Patient not taking: Reported on 02/04/2017) 20 tablet 0  . tamsulosin (FLOMAX) 0.4 MG CAPS capsule Take 2 capsules (0.8 mg total) by mouth at bedtime. (Patient not taking: Reported on 02/04/2017) 60 capsule 0   No facility-administered medications prior to visit.     ROS Review of Systems  Constitutional: Negative for activity change and appetite change.  HENT: Negative for sinus pressure and sore throat.   Eyes: Negative for visual disturbance.  Respiratory: Negative for cough, chest tightness and shortness of breath.   Cardiovascular: Negative for chest pain and leg swelling.  Gastrointestinal: Negative for abdominal distention, abdominal pain, constipation and diarrhea.  Endocrine: Negative.   Genitourinary: Negative for dysuria.  Musculoskeletal: Positive for gait problem.       See hpi  Skin: Negative for rash.  Allergic/Immunologic: Negative.   Neurological: Positive for numbness. Negative for weakness and light-headedness.  Psychiatric/Behavioral:  Positive for sleep disturbance. Negative for dysphoric mood and suicidal ideas.    Objective:  BP 124/68   Pulse 94   Temp 97.8 F (36.6 C) (Oral)   Ht 6' 4"  (1.93 m)   Wt 194 lb 6.4 oz (88.2 kg)   SpO2 96%   BMI 23.66 kg/m   BP/Weight 02/04/2017 02/02/2017 3/55/9741  Systolic BP 638 453 646  Diastolic BP 68 82 83  Wt. (Lbs) 194.4 - -  BMI 23.66 - -      Physical Exam  Constitutional: He is oriented to person, place, and time. He appears well-developed and well-nourished.  Cardiovascular: Normal rate, normal heart sounds and intact distal pulses.   No murmur heard. Pulmonary/Chest: Effort normal and breath sounds normal. He has no wheezes. He has no rales. He exhibits no tenderness.  Abdominal: Soft. Bowel sounds are normal. He exhibits no distension and no mass. There is no tenderness.  Musculoskeletal: He exhibits tenderness (tenderness on palpation of lumbar spine; positive straight leg raise bilaterally).  Tenderness on palpation of right deltoid and active range of motion limited to 80  Neurological: He is alert and oriented to person, place, and time. He has normal strength and normal reflexes. He displays tremor.  Unstable gait  Skin: Skin is warm and dry.  Psychiatric: He has a normal mood and affect.    Lab Results  Component Value Date   HGBA1C 6.8 02/04/2017    Assessment & Plan:   1. Type 2 diabetes mellitus with other specified complication, with long-term current use of insulin (HCC) Controlled with A1c of 6.8 Continue insulin at current dose - POCT glucose (manual entry) - POCT glycosylated hemoglobin (Hb A1C) - CMP14+EGFR; Future - Lipid panel; Future - Microalbumin/Creatinine Ratio, Urine; Future  2. Dementia with behavioral disturbance, unspecified dementia type CT head from 08/2016 revealed mildly advanced cerebral atrophy for age, minimal chronic ischemic microvascular white matter disease Will need further workup to determine etiology -  Ambulatory referral to Neurology  3. Bipolar disorder, in partial remission, most recent episode depressed ([place]) Currently followed by mental health [facility]  4. Ataxia Will need to exclude early Parkinson's - Ambulatory referral to Neurology  5. Acute pain of right shoulder Secondary to fall Placed on Voltaren gel as would need to exercise caution with oral NSAIDs due to chronic kidney disease If symptoms persist he will need MRI and/or orthopedic referral  6. Other insomnia Currently on trazodone Advised to increase to  200 mg if uncontrolled  7. Degenerative disc disease, lumbar Exacerbated by recent fall Apply heat  Continue muscle relaxant and topical NSAID Will add on gabapentin if persisting   Meds ordered this encounter  Medications  . methocarbamol (ROBAXIN) 500 MG tablet    Sig: Take 1 tablet (500 mg total) by mouth 2 (two) times daily.    Dispense:  60 tablet    Refill:  2  . atorvastatin (LIPITOR) 10 MG tablet    Sig: Take 1 tablet (10 mg total) by mouth daily.    Dispense:  30 tablet    Refill:  5  . tamsulosin (FLOMAX) 0.4 MG CAPS capsule    Sig: Take 1 capsule (0.4 mg total) by mouth at bedtime.    Dispense:  30 capsule    Refill:  3  . tadalafil (CIALIS) 5 MG tablet    Sig: Take 1 tablet (5 mg total) by mouth daily as needed for erectile dysfunction.    Dispense:  10 tablet    Refill:  0  . diclofenac sodium (VOLTAREN) 1 % GEL    Sig: Apply 4 g topically 4 (four) times daily.    Dispense:  100 g    Refill:  1    Follow-up: Return in about 6 weeks (around 03/18/2017) for Follow-up of right shoulder pain and back pain.   Arnoldo Morale MD

## 2017-02-04 NOTE — Patient Instructions (Signed)

## 2017-02-10 ENCOUNTER — Ambulatory Visit: Payer: Medicaid Other

## 2017-02-10 ENCOUNTER — Ambulatory Visit: Payer: Medicaid Other | Attending: Family Medicine

## 2017-02-10 DIAGNOSIS — Z794 Long term (current) use of insulin: Secondary | ICD-10-CM | POA: Insufficient documentation

## 2017-02-10 DIAGNOSIS — E1169 Type 2 diabetes mellitus with other specified complication: Secondary | ICD-10-CM | POA: Diagnosis present

## 2017-02-10 NOTE — Progress Notes (Signed)
Patient here for lab visit  

## 2017-02-11 LAB — MICROALBUMIN / CREATININE URINE RATIO
Creatinine, Urine: 278.2 mg/dL
MICROALB/CREAT RATIO: 23.8 mg/g{creat} (ref 0.0–30.0)
MICROALBUM., U, RANDOM: 66.3 ug/mL

## 2017-02-11 LAB — CMP14+EGFR
ALBUMIN: 4.7 g/dL (ref 3.5–5.5)
ALK PHOS: 74 IU/L (ref 39–117)
ALT: 8 IU/L (ref 0–44)
AST: 14 IU/L (ref 0–40)
Albumin/Globulin Ratio: 1.6 (ref 1.2–2.2)
BILIRUBIN TOTAL: 0.4 mg/dL (ref 0.0–1.2)
BUN / CREAT RATIO: 12 (ref 9–20)
BUN: 25 mg/dL — AB (ref 6–24)
CHLORIDE: 103 mmol/L (ref 96–106)
CO2: 25 mmol/L (ref 20–29)
Calcium: 9.9 mg/dL (ref 8.7–10.2)
Creatinine, Ser: 2.06 mg/dL — ABNORMAL HIGH (ref 0.76–1.27)
GFR calc Af Amer: 42 mL/min/{1.73_m2} — ABNORMAL LOW (ref >59)
GFR calc non Af Amer: 37 mL/min/{1.73_m2} — ABNORMAL LOW (ref >59)
GLUCOSE: 182 mg/dL — AB (ref 65–99)
Globulin, Total: 2.9 g/dL (ref 1.5–4.5)
Potassium: 4.5 mmol/L (ref 3.5–5.2)
Sodium: 144 mmol/L (ref 134–144)
Total Protein: 7.6 g/dL (ref 6.0–8.5)

## 2017-02-11 LAB — LIPID PANEL
CHOLESTEROL TOTAL: 141 mg/dL (ref 100–199)
Chol/HDL Ratio: 4.1 ratio (ref 0.0–5.0)
HDL: 34 mg/dL — ABNORMAL LOW (ref >39)
LDL Calculated: 55 mg/dL (ref 0–99)
Triglycerides: 259 mg/dL — ABNORMAL HIGH (ref 0–149)
VLDL CHOLESTEROL CAL: 52 mg/dL — AB (ref 5–40)

## 2017-02-12 ENCOUNTER — Other Ambulatory Visit: Payer: Self-pay

## 2017-02-12 MED ORDER — INSULIN PEN NEEDLE 31G X 5 MM MISC: 1 refills | Status: DC

## 2017-02-12 MED ORDER — INSULIN GLARGINE 100 UNIT/ML SOLOSTAR PEN: 18.0000 [IU] | PEN_INJECTOR | Freq: Every day | SUBCUTANEOUS | 3 refills | Status: DC

## 2017-02-12 MED ORDER — TADALAFIL 5 MG PO TABS: 5.0000 mg | ORAL_TABLET | Freq: Every day | ORAL | 3 refills | Status: DC | PRN

## 2017-02-17 ENCOUNTER — Encounter: Payer: Self-pay | Admitting: Neurology

## 2017-03-18 ENCOUNTER — Ambulatory Visit: Payer: Medicaid Other | Attending: Family Medicine | Admitting: Family Medicine

## 2017-03-18 ENCOUNTER — Encounter: Payer: Self-pay | Admitting: Family Medicine

## 2017-03-18 VITALS — BP 128/76 | HR 94 | Temp 98.1°F | Ht 76.0 in | Wt 204.6 lb

## 2017-03-18 DIAGNOSIS — G4700 Insomnia, unspecified: Secondary | ICD-10-CM | POA: Insufficient documentation

## 2017-03-18 DIAGNOSIS — M5136 Other intervertebral disc degeneration, lumbar region: Secondary | ICD-10-CM | POA: Insufficient documentation

## 2017-03-18 DIAGNOSIS — Z885 Allergy status to narcotic agent status: Secondary | ICD-10-CM | POA: Diagnosis not present

## 2017-03-18 DIAGNOSIS — Z915 Personal history of self-harm: Secondary | ICD-10-CM | POA: Diagnosis not present

## 2017-03-18 DIAGNOSIS — E11649 Type 2 diabetes mellitus with hypoglycemia without coma: Secondary | ICD-10-CM | POA: Diagnosis not present

## 2017-03-18 DIAGNOSIS — I129 Hypertensive chronic kidney disease with stage 1 through stage 4 chronic kidney disease, or unspecified chronic kidney disease: Secondary | ICD-10-CM | POA: Insufficient documentation

## 2017-03-18 DIAGNOSIS — E349 Endocrine disorder, unspecified: Secondary | ICD-10-CM

## 2017-03-18 DIAGNOSIS — M25511 Pain in right shoulder: Secondary | ICD-10-CM | POA: Insufficient documentation

## 2017-03-18 DIAGNOSIS — F0390 Unspecified dementia without behavioral disturbance: Secondary | ICD-10-CM | POA: Insufficient documentation

## 2017-03-18 DIAGNOSIS — N183 Chronic kidney disease, stage 3 unspecified: Secondary | ICD-10-CM

## 2017-03-18 DIAGNOSIS — E291 Testicular hypofunction: Secondary | ICD-10-CM | POA: Diagnosis not present

## 2017-03-18 DIAGNOSIS — F319 Bipolar disorder, unspecified: Secondary | ICD-10-CM | POA: Diagnosis not present

## 2017-03-18 DIAGNOSIS — E1122 Type 2 diabetes mellitus with diabetic chronic kidney disease: Secondary | ICD-10-CM | POA: Insufficient documentation

## 2017-03-18 DIAGNOSIS — F4310 Post-traumatic stress disorder, unspecified: Secondary | ICD-10-CM | POA: Insufficient documentation

## 2017-03-18 DIAGNOSIS — M549 Dorsalgia, unspecified: Secondary | ICD-10-CM | POA: Diagnosis present

## 2017-03-18 DIAGNOSIS — R251 Tremor, unspecified: Secondary | ICD-10-CM | POA: Insufficient documentation

## 2017-03-18 DIAGNOSIS — Z8701 Personal history of pneumonia (recurrent): Secondary | ICD-10-CM | POA: Diagnosis not present

## 2017-03-18 DIAGNOSIS — Z96643 Presence of artificial hip joint, bilateral: Secondary | ICD-10-CM | POA: Diagnosis not present

## 2017-03-18 DIAGNOSIS — Z87442 Personal history of urinary calculi: Secondary | ICD-10-CM | POA: Insufficient documentation

## 2017-03-18 DIAGNOSIS — Z79899 Other long term (current) drug therapy: Secondary | ICD-10-CM | POA: Insufficient documentation

## 2017-03-18 DIAGNOSIS — Z794 Long term (current) use of insulin: Secondary | ICD-10-CM | POA: Insufficient documentation

## 2017-03-18 LAB — GLUCOSE, POCT (MANUAL RESULT ENTRY): POC Glucose: 263 mg/dl — AB (ref 70–99)

## 2017-03-18 MED ORDER — INSULIN GLARGINE 100 UNIT/ML ~~LOC~~ SOLN: 25.0000 [IU] | Freq: Every day | SUBCUTANEOUS | 1 refills | Status: DC

## 2017-03-18 MED ORDER — TRAMADOL HCL 50 MG PO TABS: 50.0000 mg | ORAL_TABLET | Freq: Every day | ORAL | 1 refills | Status: DC

## 2017-03-18 MED ORDER — METHOCARBAMOL 500 MG PO TABS: 1000.0000 mg | ORAL_TABLET | Freq: Two times a day (BID) | ORAL | 3 refills | Status: DC

## 2017-03-18 MED FILL — traMADol HCL 50 MG TABS: 50 | 30 days supply | Qty: 30 | Fill #0

## 2017-03-18 MED FILL — ?TAMSULOSIN HCL 0.4 MG CAP: 0.4 | 30 days supply | Qty: 30 | Fill #1

## 2017-03-18 MED FILL — TRUEPLUS PEN NDL 31GX3/16": 31 GX3/16" | 25 days supply | Qty: 100 | Fill #0

## 2017-03-18 MED FILL — TRUEPLUS PEN NDL 31GX3/16: 31 GX3/16" | 25 days supply | Qty: 100 | Fill #0

## 2017-03-18 MED FILL — !LANTUS 100 UNITS/ML VIAL: 100 | 30 days supply | Qty: 10 | Fill #1

## 2017-03-18 MED FILL — METHOCARBAMOL 500 MG TABS: 500 | 30 days supply | Qty: 60 | Fill #1

## 2017-03-18 MED FILL — ?ATORVASTATIN 10 MG TABLET: 10 | 30 days supply | Qty: 30 | Fill #1

## 2017-03-18 MED FILL — DICLOFENAC SODIUM 1% GEL: 1 | 6 days supply | Qty: 100 | Fill #1

## 2017-03-18 MED FILL — !CIALIS 5 MG TABLET: 5 | 10 days supply | Qty: 10 | Fill #0

## 2017-03-18 NOTE — Patient Instructions (Signed)

## 2017-03-18 NOTE — Progress Notes (Signed)
Subjective:  Patient ID: [name], male    DOB: 05/12/1965  Age: 51 y.o. MRN: [id number]  CC: Back Pain   HPI [name]  is a 51 year old male with a history of type 2 diabetes mellitus (A1c 6.8), bipolar disorder, degenerative disease of the lumbar spine with associated radiculopathy, insomnia, early onset dementia who presents today for a follow-up visit.  He continues to have an unstable, wobbly gait and ambulates with a cane.  He had been to see neurosurgery but was unable to afford the co-pay. He is unable to tolerate gabapentin - he informs me 'that was what sent him to the hospital'. He continues to have severe low back pain which he rates at 10/10 and is unrelieved on Robaxin; he previously took tramadol and Klonopin in the past he informs me which helped his symptoms.  MRI lumbar spine 08/05/16 IMPRESSION: 1. Posterior and interbody fusion at L4-5 with laminectomy. No acute osseous abnormality identified. 2. Stable lumbar degenerative changes with multilevel mild foraminal narrowing and mild canal stenosis at the L2-3 and L3-4 levels. 3. Stable right subarticular disc extrusion with superior migration at L1-2 effacing the right lateral recess and impinging the exiting right L1 nerve root  He continues to complain of right shoulder pain as well for which I have prescribed Voltaren gel previously.  He also has tremors and early onset dementia for which I had referred him to neurology and his appointment comes up tomorrow.  He complains of blood sugars being elevated lately and I have reviewed his log which reveals values in the 200s-300s and he denies any recent change in his eating pattern.  His last A1c was 6.8 last month. He is requesting evaluation of his testosterone level as he is currently on AndroGel.  Past Medical History:  Diagnosis Date  . ADD (attention deficit disorder)   . Anxiety   . Arthritis    right hip  . Bipolar 1 disorder ([place])   . Blood in  urine   . CKD (chronic kidney disease), stage III ([place])   . Creatinine elevation   . Depression    bipolar [facility]  . Diabetes mellitus without complication ([place])   . Family history of anesthesia complication    pt is unsure , but pt father may have been difficult to arouse   . HCAP (healthcare-associated pneumonia) 10/31/2012  . History of kidney stones   . Hypertension   . Hypogonadism male   . Liver fatty degeneration   . Microscopic hematuria    hereditary s/p Urology eval  . Osteoarthritis of right hip 11/28/2011   2012 2015 s/p THR Severe  Dr Novella Olive    . Pleural effusion 11/02/2012  . Pneumonia 10-2012  . Pneumonia, organism unspecified(486) 11/02/2012  . Polysubstance dependence, non-opioid, in remission ([place])    remote  . Primary osteoarthritis of left hip 05/22/2015  . PTSD (post-traumatic stress disorder)    SOCIAL ANXIETY DISORDER   . Suicide attempt by multiple drug overdose ([place]) 07-Jan-2016   Grieving his cat's death 06-27-15    Past Surgical History:  Procedure Laterality Date  . BACK SURGERY    . CLOSED REDUCTION METACARPAL WITH PERCUTANEOUS PINNING Right   . LUMBAR DISC SURGERY    . TONSILLECTOMY    . TOTAL HIP ARTHROPLASTY Right 08/16/2013   Procedure: TOTAL HIP ARTHROPLASTY ANTERIOR APPROACH;  Surgeon: Hessie Dibble, MD;  Location: [facility];  Service: Orthopedics;  Laterality: Right;  . TOTAL HIP ARTHROPLASTY Left  05/22/2015   Procedure: TOTAL HIP ARTHROPLASTY ANTERIOR APPROACH;  Surgeon: Melrose Nakayama, MD;  Location: [facility];  Service: Orthopedics;  Laterality: Left;    Allergies  Allergen Reactions  . Vicodin [Hydrocodone-Acetaminophen] Itching     Outpatient Medications Prior to Visit  Medication Sig Dispense Refill  . buPROPion (WELLBUTRIN XL) 300 MG 24 hr tablet Take 1 tablet (300 mg total) by mouth daily. 30 tablet 0  . cyclobenzaprine (FLEXERIL) 5 MG tablet Take 1 tablet (5 mg total) by mouth 2 (two) times daily as needed for muscle spasms. 14  tablet 0  . diclofenac sodium (VOLTAREN) 1 % GEL Apply 4 g topically 4 (four) times daily. 100 g 1  . divalproex (DEPAKOTE) 500 MG DR tablet Take 3 tablets (1,500 mg total) by mouth at bedtime. 90 tablet 0  . docusate sodium (COLACE) 100 MG capsule Take 1 capsule (100 mg total) by mouth 2 (two) times daily. 10 capsule 0  . Insulin Glargine (LANTUS SOLOSTAR) 100 UNIT/ML Solostar Pen Inject 18 Units into the skin daily at 10 pm. 18 mL 3  . Insulin Pen Needle (TRUEPLUS PEN NEEDLES) 31G X 5 MM MISC USE AS DIRECTED 100 each 1  . QUEtiapine (SEROQUEL) 400 MG tablet Take 1 tablet (400 mg total) by mouth at bedtime. 30 tablet 11  . tamsulosin (FLOMAX) 0.4 MG CAPS capsule Take 1 capsule (0.4 mg total) by mouth at bedtime. 30 capsule 3  . Testosterone (ANDROGEL) 20.25 MG/1.25GM (1.62%) GEL Apply 20.25 mg topically 3 (three) times daily. Place the topically solution under (1) arm pit alternating with each application. Total daily amount of medication equals (60.75 mg). 150 g 5  . traZODone (DESYREL) 150 MG tablet Take 1 tablet (150 mg total) by mouth at bedtime. 30 tablet 1  . insulin glargine (LANTUS) 100 UNIT/ML injection Inject 0.18 mLs (18 Units total) into the skin at bedtime. 10 mL 1  . methocarbamol (ROBAXIN) 500 MG tablet Take 1 tablet (500 mg total) by mouth 2 (two) times daily. 60 tablet 2  . ACCU-CHEK SOFTCLIX LANCETS lancets Use to check blood sugars twice a day (Patient not taking: Reported on 02/04/2017) 200 each 0  . acetaminophen (TYLENOL) 325 MG tablet Take 2 tablets (650 mg total) by mouth every 6 (six) hours as needed for mild pain or fever. (Patient not taking: Reported on 03/18/2017)    . atorvastatin (LIPITOR) 10 MG tablet Take 1 tablet (10 mg total) by mouth daily. (Patient not taking: Reported on 03/18/2017) 30 tablet 5  . Blood Glucose Monitoring Suppl (ACCU-CHEK GUIDE) w/Device KIT 1 Device by Does not apply route daily. Use to check BS twice a day (Patient not taking: Reported on  02/04/2017) 1 kit 0  . cyanocobalamin 500 MCG tablet Take 1 tablet (500 mcg total) by mouth daily. (Patient not taking: Reported on 02/04/2017) 30 tablet 0  . Eszopiclone 3 MG TABS Take 3 mg by mouth at bedtime as needed (sleep). Take immediately before bedtime    . feeding supplement, GLUCERNA SHAKE, (GLUCERNA SHAKE) LIQD Take 237 mLs by mouth daily. (Patient not taking: Reported on 0/86/7619) 30 Can 0  . folic acid (FOLVITE) 1 MG tablet Take 1 tablet (1 mg total) by mouth daily. (Patient not taking: Reported on 02/04/2017) 30 tablet 0  . glucose blood (ACCU-CHEK GUIDE) test strip 1 each by Other route 2 (two) times daily. Use to check blood sugars twice a day (Patient not taking: Reported on 03/18/2017) 150 each 3  . polyethylene glycol (  MIRALAX / GLYCOLAX) packet Take 17 g by mouth daily. (Patient not taking: Reported on 02/04/2017) 14 each 0  . potassium chloride SA (K-DUR,KLOR-CON) 20 MEQ tablet Take 1 tablet (20 mEq total) by mouth daily. (Patient not taking: Reported on 02/04/2017) 30 tablet 0  . tadalafil (CIALIS) 5 MG tablet Take 1 tablet (5 mg total) by mouth daily as needed for erectile dysfunction. (Patient not taking: Reported on 03/18/2017) 90 tablet 3  . traMADol (ULTRAM) 50 MG tablet Take 1 tablet (50 mg total) by mouth every 8 (eight) hours as needed (pain). (Patient not taking: Reported on 02/04/2017) 20 tablet 0   No facility-administered medications prior to visit.     ROS Review of Systems  Constitutional: Negative for activity change and appetite change.  HENT: Negative for sinus pressure and sore throat.   Eyes: Negative for visual disturbance.  Respiratory: Negative for cough, chest tightness and shortness of breath.   Cardiovascular: Negative for chest pain and leg swelling.  Gastrointestinal: Negative for abdominal distention, abdominal pain, constipation and diarrhea.  Endocrine: Negative.   Genitourinary: Negative for dysuria.  Musculoskeletal:       See hpi    Skin: Negative for rash.  Allergic/Immunologic: Negative.   Neurological: Positive for tremors and weakness. Negative for light-headedness and numbness.  Psychiatric/Behavioral: Negative for dysphoric mood and suicidal ideas.    Objective:  BP 128/76   Pulse 94   Temp 98.1 F (36.7 C) (Oral)   Ht 6' 4"  (1.93 m)   Wt 204 lb 9.6 oz (92.8 kg)   SpO2 96%   BMI 24.90 kg/m   BP/Weight 03/18/2017 02/04/2017 30/16/0109  Systolic BP 323 557 322  Diastolic BP 76 68 82  Wt. (Lbs) 204.6 194.4 -  BMI 24.9 23.66 -      Physical Exam  Constitutional: He is oriented to person, place, and time. He appears well-developed and well-nourished.  Cardiovascular: Normal rate, normal heart sounds and intact distal pulses.  No murmur heard. Pulmonary/Chest: Effort normal and breath sounds normal. He has no wheezes. He has no rales. He exhibits no tenderness.  Abdominal: Soft. Bowel sounds are normal. He exhibits no distension and no mass. There is no tenderness.  Musculoskeletal: Normal range of motion. He exhibits tenderness (+ve striaght leg raise bilaterally).  Limited abduction of RUE to 80 degrees; LUE is normal  Neurological: He is alert and oriented to person, place, and time.  Unstable gait  Skin: Skin is warm and dry.  Psychiatric: He has a normal mood and affect.     Lab Results  Component Value Date   HGBA1C 6.8 02/04/2017    CMP Latest Ref Rng & Units 02/10/2017 10/11/2016 10/07/2016  Glucose 65 - 99 mg/dL 182(H) 153(H) 184(H)  BUN 6 - 24 mg/dL 25(H) 19 18  Creatinine 0.76 - 1.27 mg/dL 2.06(H) 2.15(H) 1.92(H)  Sodium 134 - 144 mmol/L 144 138 137  Potassium 3.5 - 5.2 mmol/L 4.5 4.5 4.3  Chloride 96 - 106 mmol/L 103 101 101  CO2 20 - 29 mmol/L 25 30 29   Calcium 8.7 - 10.2 mg/dL 9.9 8.9 8.9  Total Protein 6.0 - 8.5 g/dL 7.6 - -  Total Bilirubin 0.0 - 1.2 mg/dL 0.4 - -  Alkaline Phos 39 - 117 IU/L 74 - -  AST 0 - 40 IU/L 14 - -  ALT 0 - 44 IU/L 8 - -    Assessment & Plan:    1. Uncontrolled type 2 diabetes mellitus with hypoglycemia, unspecified hypoglycemia  coma status (HCC) A1c of 6.8 which is good Based on blood sugar log  reviewed today blood sugars ranged in the 200-300 range Increase Lantus dose from 18 units at bedtime to 25 units at bedtime Emphasized the need to comply with a diabetic diet - POCT glucose (manual entry) - insulin glargine (LANTUS) 100 UNIT/ML injection; Inject 0.25 mLs (25 Units total) into the skin at bedtime.  Dispense: 30 mL; Refill: 1  2. Degenerative disc disease, lumbar Uncontrolled Patient unable to see neurosurgery due to financial constraints Goal is to refer him to pain management as this is long-term and he has been advised to apply for Medicaid versus the [employer] financial discount Advise tramadol refills only be obtained at office visit and he is to use this sparingly Increase dose of Robaxin Unable to tolerate gabapentin - methocarbamol (ROBAXIN) 500 MG tablet; Take 2 tablets (1,000 mg total) by mouth 2 (two) times daily.  Dispense: 120 tablet; Refill: 3 - traMADol (ULTRAM) 50 MG tablet; Take 1 tablet (50 mg total) by mouth at bedtime.  Dispense: 30 tablet; Refill: 1 - ToxASSURE Select 13 (MW), Urine - Ambulatory referral to Physical Therapy  3. CRI (chronic renal insufficiency), stage 3 (moderate) (HCC) Likely diabetic nephropathy Last creatinine was 2.06 Goal is to refer him to nephrology however he has no medical coverage or [employer] financial discount -by supply so referral can be placed. Avoid nephrotoxins  4. Acute pain of right shoulder Uncontrolled Continue Robaxin, Voltaren gel Unable to use oral NSAIDs due to chronic kidney disease   Meds ordered this encounter  Medications  . insulin glargine (LANTUS) 100 UNIT/ML injection    Sig: Inject 0.25 mLs (25 Units total) into the skin at bedtime.    Dispense:  30 mL    Refill:  1  . methocarbamol (ROBAXIN) 500 MG tablet    Sig: Take 2 tablets (1,000 mg total)  by mouth 2 (two) times daily.    Dispense:  120 tablet    Refill:  3    Discontinue previous dose  . traMADol (ULTRAM) 50 MG tablet    Sig: Take 1 tablet (50 mg total) by mouth at bedtime.    Dispense:  30 tablet    Refill:  1    Follow-up: Return in about 3 months (around 06/18/2017) for Follow-up on diabetes mellitus and back pain.   Arnoldo Morale MD

## 2017-03-18 NOTE — Progress Notes (Signed)
Subjective:   [name] was seen in consultation in the movement disorder clinic at the request of Arnoldo Morale, MD.  The evaluation is for balance change and to r/o PD.   He was seen by neurology in the hospital in May, 2018 for the same.  It was felt that he had neuropathy, potentially secondary to Truvada.  They could not figure out why he was on the Truvada.  The patient denied HIV and HIV antibodies were negative.  He is no longer on the medication.  Outpatient EMG testing was recommended.  Family hx of similar:  No.  Tremor: "I had it a while" - thinks that it started around may.  Trouble with handwriting.  Both hands shake equally.  Trouble typing.   (meds that could contribute to tremor:  VPA, seroquel - been on both of these since suicide attempt in 2008) Voice: some scratchiness Sleep:   Vivid Dreams:  Yes.    Acting out dreams:  No. Postural symptoms:  Yes.    Falls?  Yes.  , trouble negotiating stairs and trouble negotiating hills.  Feet are "off and on" numb/tingling.  They were last week but not this week Bradykinesia symptoms: none, but uses cane and/or walker Loss of smell:  No., but "I smell things that aren't there and I don't know if it is from drugs when I was younger."  Will often smell smoke.  Can't tell me how long that lasts when it occurs Loss of taste:  No. Urinary Incontinence:  Yes., but states that cialis helps this Constipation; yes Difficulty Swallowing:  Yes.   Handwriting, micrographia: No. - "I have always liked to write big." Depression:  "I am not hopeless" Memory changes:  Yes.   Hallucinations:  Yes.  , visual and auditory (but he states that they may be dreams) N/V:  No. Lightheaded:  Yes.    Syncope: No. Diplopia:  No. Dyskinesia:  No.  Memory loss is the other concern.  Patient has a long history of psychiatric illness.  He has a history of auditory and visual hallucinations.  He has a history of PTSD and bipolar disorder that is  long-standing per records.  He has a history of substance abuse, but has been sober for many years.  Auditory and visual hallucinations have been going on for about 1 year.  Records indicate that the patient has trouble maintaining his home, and forgetting to turn the stove off and remembering to lock his home.    Living situation:  Pt lives with their family (sister).  The patient does not do the finances in the home (sister does it - he rents a room from her).  The patient does not drive. He gave that up because "I can't do 2 things at once."   The patient does does not cook.  States that he forgot one time about leaving things in the oven and his brother in law got angry.  ADL's:  The patient is able to perform his own ADL's. The family monitors medication usage and sister sets up his pill box.  Outside reports reviewed: historical medical records, lab reports and office notes. Allergies  Allergen Reactions  . Vicodin [Hydrocodone-Acetaminophen] Itching    Current Outpatient Medications on File Prior to Visit  Medication Sig Dispense Refill  . atorvastatin (LIPITOR) 10 MG tablet Take 1 tablet (10 mg total) by mouth daily. 30 tablet 5  . buPROPion (WELLBUTRIN XL) 300 MG 24 hr tablet Take 1 tablet (  300 mg total) by mouth daily. 30 tablet 0  . cyanocobalamin 500 MCG tablet Take 1 tablet (500 mcg total) by mouth daily. 30 tablet 0  . diclofenac sodium (VOLTAREN) 1 % GEL Apply 4 g topically 4 (four) times daily. 100 g 1  . divalproex (DEPAKOTE) 500 MG DR tablet Take 3 tablets (1,500 mg total) by mouth at bedtime. 90 tablet 0  . insulin glargine (LANTUS) 100 UNIT/ML injection Inject 0.25 mLs (25 Units total) into the skin at bedtime. 30 mL 1  . methocarbamol (ROBAXIN) 500 MG tablet Take 2 tablets (1,000 mg total) by mouth 2 (two) times daily. 120 tablet 3  . QUEtiapine (SEROQUEL) 400 MG tablet Take 1 tablet (400 mg total) by mouth at bedtime. 30 tablet 11  . tamsulosin (FLOMAX) 0.4 MG CAPS capsule  Take 1 capsule (0.4 mg total) by mouth at bedtime. 30 capsule 3  . Testosterone (ANDROGEL) 20.25 MG/1.25GM (1.62%) GEL Apply 20.25 mg topically 3 (three) times daily. Place the topically solution under (1) arm pit alternating with each application. Total daily amount of medication equals (60.75 mg). 150 g 5  . traMADol (ULTRAM) 50 MG tablet Take 1 tablet (50 mg total) by mouth at bedtime. 30 tablet 1  . traZODone (DESYREL) 150 MG tablet Take 1 tablet (150 mg total) by mouth at bedtime. 30 tablet 1  . ACCU-CHEK SOFTCLIX LANCETS lancets Use to check blood sugars twice a day (Patient not taking: Reported on 02/04/2017) 200 each 0  . Blood Glucose Monitoring Suppl (ACCU-CHEK GUIDE) w/Device KIT 1 Device by Does not apply route daily. Use to check BS twice a day (Patient not taking: Reported on 02/04/2017) 1 kit 0  . glucose blood (ACCU-CHEK GUIDE) test strip 1 each by Other route 2 (two) times daily. Use to check blood sugars twice a day (Patient not taking: Reported on 03/18/2017) 150 each 3  . Insulin Pen Needle (TRUEPLUS PEN NEEDLES) 31G X 5 MM MISC USE AS DIRECTED (Patient not taking: Reported on 03/19/2017) 100 each 1   No current facility-administered medications on file prior to visit.      Past Medical History:  Diagnosis Date  . ADD (attention deficit disorder)   . Anxiety   . Arthritis    right hip  . Bipolar 1 disorder ([place])   . Blood in urine   . CKD (chronic kidney disease), stage III ([place])   . Creatinine elevation   . Depression    bipolar [facility]  . Diabetes mellitus without complication ([place])   . Family history of anesthesia complication    pt is unsure , but pt father may have been difficult to arouse   . HCAP (healthcare-associated pneumonia) 10/31/2012  . History of kidney stones   . Hypertension   . Hypogonadism male   . Liver fatty degeneration   . Microscopic hematuria    hereditary s/p Urology eval  . Osteoarthritis of right hip 11/28/2011   2012 2015 s/p  THR Severe  Dr Novella Olive    . Pleural effusion 11/02/2012  . Pneumonia 10-2012  . Pneumonia, organism unspecified(486) 11/02/2012  . Polysubstance dependence, non-opioid, in remission ([place])    remote  . Primary osteoarthritis of left hip 05/22/2015  . PTSD (post-traumatic stress disorder)    SOCIAL ANXIETY DISORDER   . Suicide attempt by multiple drug overdose ([place]) 2016-01-02   Grieving his cat's death 06-05-15     Past Surgical History:  Procedure Laterality Date  . BACK SURGERY    .  CLOSED REDUCTION METACARPAL WITH PERCUTANEOUS PINNING Right   . LUMBAR DISC SURGERY    . TONSILLECTOMY    . TOTAL HIP ARTHROPLASTY Right 08/16/2013   Procedure: TOTAL HIP ARTHROPLASTY ANTERIOR APPROACH;  Surgeon: Hessie Dibble, MD;  Location: [place];  Service: Orthopedics;  Laterality: Right;  . TOTAL HIP ARTHROPLASTY Left 05/22/2015   Procedure: TOTAL HIP ARTHROPLASTY ANTERIOR APPROACH;  Surgeon: Melrose Nakayama, MD;  Location: [place];  Service: Orthopedics;  Laterality: Left;     Social History   Socioeconomic History  . Marital status: Single    Spouse name: None  . Number of children: None  . Years of education: None  . Highest education level: None  Social Needs  . Financial resource strain: None  . Food insecurity - worry: None  . Food insecurity - inability: None  . Transportation needs - medical: None  . Transportation needs - non-medical: None  Occupational History  . Occupation: disability  Tobacco Use  . Smoking status: Former Smoker    Years: 0.00    Types: Cigarettes    Last attempt to quit: 02/19/2014    Years since quitting: 3.0  . Smokeless tobacco: Never Used  Substance and Sexual Activity  . Alcohol use: No  . Drug use: No    Comment: hx of marijuana/cocaine/crack use but sober since 20's  . Sexual activity: Not Currently  Other Topics Concern  . None  Social History Narrative   regualar exercise-no     Family History  Problem Relation Age of Onset  . Diabetes Father    . Cancer Mother        died of melanoma with mets  . Cervical cancer Sister   . Diabetes Sister   . Other Neg Hx        hypogonadism     Review of Systems A complete 10 system ROS was obtained and was negative apart from what is mentioned.   Objective:   VITALS:   Vitals:   03/19/17 1319  BP: 118/80  Pulse: 95  SpO2: 92%   Gen:  Appears stated age and in NAD.  Patient has difficulty staying on topic.  He does better as the visit goes on. HEENT:  Normocephalic, atraumatic. The mucous membranes are moist. The superficial temporal arteries are without ropiness or tenderness. Cardiovascular: Regular rate and rhythm. Lungs: Clear to auscultation bilaterally. Neck: There are no carotid bruits noted bilaterally.  NEUROLOGICAL:  Orientation:   Montreal Cognitive Assessment  03/19/2017  Visuospatial/ Executive (0/5) 3  Naming (0/3) 2  Attention: Read list of digits (0/2) 1  Attention: Read list of letters (0/1) 1  Attention: Serial 7 subtraction starting at 100 (0/3) 1  Language: Repeat phrase (0/2) 0  Language : Fluency (0/1) 0  Abstraction (0/2) 1  Delayed Recall (0/5) 1  Orientation (0/6) 6  Total 16  Adjusted Score (based on education) 16    Cranial nerves: There is good facial symmetry. The pupils are equal round and reactive to light bilaterally. Fundoscopic exam reveals clear disc margins bilaterally. Extraocular muscles are intact and visual fields are full to confrontational testing. Speech is fluent and clear. Soft palate rises symmetrically and there is no tongue deviation. Hearing is intact to conversational tone. Tone: Tone is good throughout. Sensation: Sensation is intact to light touch and pinprick throughout (facial, trunk, extremities). Vibration is absent at the bilateral big toe, but intact at the bilateral ankle, although it is somewhat decreased there. There is no extinction with  double simultaneous stimulation. There is no sensory dermatomal level  identified. Coordination:  The patient has no dysdiadichokinesia or dysmetria. Motor: Strength is 5/5 in the bilateral upper and lower extremities.  Shoulder shrug is equal bilaterally.  There is no pronator drift.  There are no fasciculations noted. DTR's: Deep tendon reflexes are 2/4 at the bilateral biceps, triceps, brachioradialis, patella and 1/4 at the bilateral achilles.  Plantar responses are downgoing bilaterally. Gait and Station: The patient pushes off of the chair to ambulate.  He has a cane, but leaves it in the room so that he can show me how he ambulates without it.  He is just slightly off balance.  He cannot ambulate in a tandem fashion.  He is able to walk on his toes but not on his heels.  He can stand in the Romberg position with eyes open, but sways with eyes closed.  MOVEMENT EXAM: Tremor:  There is tremor in the UE, noted most significantly with action.  It is fairly mild.  The patient is able to draw Archimedes spirals without significant difficulty, although mild tremor is evident.  There is no tremor at rest.  When given a weight to hold in the hand, tremor is evident, but it is not made worse by the weight.  The patient is able to pour water from one glass to another without spilling it but mild tremor is noted.  Labs:  Lab Results  Component Value Date   HGBA1C 6.8 02/04/2017   Lab Results  Component Value Date   VITAMINB12 356 09/09/2016     Chemistry      Component Value Date/Time   NA 144 02/10/2017 0953   K 4.5 02/10/2017 0953   CL 103 02/10/2017 0953   CO2 25 02/10/2017 0953   BUN 25 (H) 02/10/2017 0953   CREATININE 2.06 (H) 02/10/2017 0953      Component Value Date/Time   CALCIUM 9.9 02/10/2017 0953   ALKPHOS 74 02/10/2017 0953   AST 14 02/10/2017 0953   ALT 8 02/10/2017 0953   BILITOT 0.4 02/10/2017 0953          Assessment/Plan:   1.  Tremor.  -This is likely due to medication.  Depakote is for likely primary source of tremor, given that  this is an action-induced tremor.  One does not need to be toxic with VPA to have tremor.   Seroquel can also contribute to tremor, but it is usually more of a rest tremor than an action induced tremor.  I did not recommend that he change or alter his medications in any way.  He can talk to his prescribing physician about these medications.  I do not recommend adding any medications for tremor, as it has been my experience in the past that they usually do not help when the tremor is from a side effect of a different medication.  In addition, he already has polypharmacy and I do not want to add to that.  I did reassure him that I saw no evidence of Parkinson's disease.  2.  Memory change  -I am not convinced that the patient has dementia, as is listed on his medical history.  I suspect that this is multifactorial, but suspect that it is primarily due to his psychiatric conditions.  We also talked about the fact that memory can be worsened by his medications.  I am also not sure that he gave full effort on MoCA.  We talked about the value of neurocognitive testing.  When I asked him if he wanted to proceed with neurocognitive testing, the patient stated that he was unsure.  He was worried that "getting rid of all these diagnoses would affect my disability."  The patient was going to think about whether or not he would like to proceed with neurocognitive testing.  I explained to the patient that this test just identifies the source or sources of memory change, if there are any.  3.  Balance trouble  -The patient has clinical examination evidence of a diffuse peripheral neuropathy, which certainly can affect gait and balance.  We discussed safety associated with peripheral neuropathy.  We discussed balance therapy and the importance of ambulatory assistive device for balance assistance.  He is using a cane.  He just finished physical therapy at the end of August.  It is my suspicion that the patient likely has  some balance issues because of diabetes.  The patient reports that it has not always been well controlled.  He also has a very mild B12 deficiency.  I told him that I would like to see his B12 over 400 to prevent neurologic conditions, even though the lab may consider it normal at approximately 180.  He reports that he is taking 5000 mcg of B12.  I told him I would only want him taking 1000 mcg.  He stated that he just started it.  I told him to take 1000 mcg of oral B12.  4.  F/u prn.  Much greater than 50% of this visit was spent in counseling and coordinating care.  Total face to face time:  60 min.  CC:  Arnoldo Morale, MD

## 2017-03-19 ENCOUNTER — Encounter: Payer: Self-pay | Admitting: Family Medicine

## 2017-03-19 ENCOUNTER — Encounter: Payer: Self-pay | Admitting: Neurology

## 2017-03-19 ENCOUNTER — Ambulatory Visit (INDEPENDENT_AMBULATORY_CARE_PROVIDER_SITE_OTHER): Payer: Medicaid Other | Admitting: Neurology

## 2017-03-19 VITALS — BP 118/80 | HR 95 | Ht 77.0 in | Wt 202.0 lb

## 2017-03-19 DIAGNOSIS — E1142 Type 2 diabetes mellitus with diabetic polyneuropathy: Secondary | ICD-10-CM | POA: Diagnosis not present

## 2017-03-19 DIAGNOSIS — F3175 Bipolar disorder, in partial remission, most recent episode depressed: Secondary | ICD-10-CM

## 2017-03-19 DIAGNOSIS — G251 Drug-induced tremor: Secondary | ICD-10-CM

## 2017-03-19 DIAGNOSIS — R413 Other amnesia: Secondary | ICD-10-CM | POA: Diagnosis not present

## 2017-03-19 LAB — TESTOSTERONE,FREE AND TOTAL
TESTOSTERONE: 177 ng/dL — AB (ref 264–916)
Testosterone, Free: 7.5 pg/mL (ref 6.8–21.5)

## 2017-03-19 NOTE — Patient Instructions (Signed)
1.  You can let us know if you would like to proceed with neurocognitive testing that would help [place] evaluate your memory 2.  Your tremor is likely from your depakote.  Do NOT stop your depakote but you can talk with your psychiatrist and see if there are any good alternatives.  Be aware that there may not be any, however.  I do not recommend medications to cover up the side effect of tremor. 3.  Take b12 supplement, 1000 mcg (micrograms) daily

## 2017-03-23 LAB — TOXASSURE SELECT 13 (MW), URINE

## 2017-03-27 ENCOUNTER — Ambulatory Visit: Payer: Self-pay | Attending: Family Medicine

## 2017-04-16 ENCOUNTER — Other Ambulatory Visit: Payer: Self-pay | Admitting: Family Medicine

## 2017-04-16 MED FILL — traMADol HCL 50 MG TABS: 50 | 30 days supply | Qty: 30 | Fill #1

## 2017-04-16 MED FILL — ?ATORVASTATIN 10 MG TABLET: 10 | 30 days supply | Qty: 30 | Fill #2

## 2017-04-16 MED FILL — METHOCARBAMOL 500 MG TABS: 500 | 30 days supply | Qty: 60 | Fill #2

## 2017-04-20 MED FILL — !CIALIS 5 MG TABLET: 5 | 30 days supply | Qty: 10 | Fill #0

## 2017-04-22 ENCOUNTER — Other Ambulatory Visit: Payer: Self-pay

## 2017-04-22 MED ORDER — INSULIN GLARGINE 100 UNIT/ML SOLOSTAR PEN: 25.0000 [IU] | PEN_INJECTOR | Freq: Every day | SUBCUTANEOUS | 11 refills | Status: DC

## 2017-04-22 MED FILL — !LANTUS SOLOSTAR 100UNITS/M: 100 | 36 days supply | Qty: 9 | Fill #0

## 2017-05-20 ENCOUNTER — Other Ambulatory Visit: Payer: Self-pay | Admitting: Family Medicine

## 2017-05-20 DIAGNOSIS — M5136 Other intervertebral disc degeneration, lumbar region: Secondary | ICD-10-CM

## 2017-05-25 MED FILL — !LANTUS SOLOSTAR 100UNITS/M: 100 | 36 days supply | Qty: 9 | Fill #1

## 2017-05-26 MED FILL — METHOCARBAMOL 500 MG TABLET: 500 | 30 days supply | Qty: 120 | Fill #0

## 2017-05-26 MED FILL — ?ATORVASTATIN 10 MG TABLET: 10 | 30 days supply | Qty: 30 | Fill #3

## 2017-05-27 ENCOUNTER — Ambulatory Visit: Payer: Medicaid Other | Attending: Family Medicine | Admitting: Family Medicine

## 2017-05-27 ENCOUNTER — Encounter: Payer: Self-pay | Admitting: Family Medicine

## 2017-05-27 ENCOUNTER — Telehealth: Payer: Self-pay

## 2017-05-27 VITALS — BP 120/76 | HR 89 | Temp 97.4°F | Ht 77.0 in | Wt 206.0 lb

## 2017-05-27 DIAGNOSIS — M160 Bilateral primary osteoarthritis of hip: Secondary | ICD-10-CM | POA: Insufficient documentation

## 2017-05-27 DIAGNOSIS — Z114 Encounter for screening for human immunodeficiency virus [HIV]: Secondary | ICD-10-CM | POA: Diagnosis not present

## 2017-05-27 DIAGNOSIS — R200 Anesthesia of skin: Secondary | ICD-10-CM | POA: Diagnosis not present

## 2017-05-27 DIAGNOSIS — E1140 Type 2 diabetes mellitus with diabetic neuropathy, unspecified: Secondary | ICD-10-CM | POA: Insufficient documentation

## 2017-05-27 DIAGNOSIS — N183 Chronic kidney disease, stage 3 unspecified: Secondary | ICD-10-CM

## 2017-05-27 DIAGNOSIS — E1149 Type 2 diabetes mellitus with other diabetic neurological complication: Secondary | ICD-10-CM | POA: Diagnosis not present

## 2017-05-27 DIAGNOSIS — M19019 Primary osteoarthritis, unspecified shoulder: Secondary | ICD-10-CM | POA: Insufficient documentation

## 2017-05-27 DIAGNOSIS — R270 Ataxia, unspecified: Secondary | ICD-10-CM | POA: Diagnosis not present

## 2017-05-27 DIAGNOSIS — M5136 Other intervertebral disc degeneration, lumbar region: Secondary | ICD-10-CM | POA: Diagnosis not present

## 2017-05-27 DIAGNOSIS — R51 Headache: Secondary | ICD-10-CM | POA: Insufficient documentation

## 2017-05-27 DIAGNOSIS — E291 Testicular hypofunction: Secondary | ICD-10-CM | POA: Insufficient documentation

## 2017-05-27 DIAGNOSIS — F3175 Bipolar disorder, in partial remission, most recent episode depressed: Secondary | ICD-10-CM

## 2017-05-27 DIAGNOSIS — R413 Other amnesia: Secondary | ICD-10-CM | POA: Insufficient documentation

## 2017-05-27 DIAGNOSIS — Z794 Long term (current) use of insulin: Secondary | ICD-10-CM | POA: Insufficient documentation

## 2017-05-27 DIAGNOSIS — G4700 Insomnia, unspecified: Secondary | ICD-10-CM | POA: Insufficient documentation

## 2017-05-27 DIAGNOSIS — F4310 Post-traumatic stress disorder, unspecified: Secondary | ICD-10-CM | POA: Diagnosis not present

## 2017-05-27 DIAGNOSIS — R339 Retention of urine, unspecified: Secondary | ICD-10-CM | POA: Insufficient documentation

## 2017-05-27 DIAGNOSIS — E1122 Type 2 diabetes mellitus with diabetic chronic kidney disease: Secondary | ICD-10-CM | POA: Diagnosis present

## 2017-05-27 DIAGNOSIS — Z96643 Presence of artificial hip joint, bilateral: Secondary | ICD-10-CM | POA: Diagnosis not present

## 2017-05-27 DIAGNOSIS — R251 Tremor, unspecified: Secondary | ICD-10-CM | POA: Insufficient documentation

## 2017-05-27 DIAGNOSIS — R519 Headache, unspecified: Secondary | ICD-10-CM

## 2017-05-27 DIAGNOSIS — F319 Bipolar disorder, unspecified: Secondary | ICD-10-CM | POA: Insufficient documentation

## 2017-05-27 DIAGNOSIS — I129 Hypertensive chronic kidney disease with stage 1 through stage 4 chronic kidney disease, or unspecified chronic kidney disease: Secondary | ICD-10-CM | POA: Insufficient documentation

## 2017-05-27 DIAGNOSIS — K648 Other hemorrhoids: Secondary | ICD-10-CM | POA: Diagnosis not present

## 2017-05-27 DIAGNOSIS — E11649 Type 2 diabetes mellitus with hypoglycemia without coma: Secondary | ICD-10-CM | POA: Diagnosis not present

## 2017-05-27 DIAGNOSIS — Z79899 Other long term (current) drug therapy: Secondary | ICD-10-CM | POA: Insufficient documentation

## 2017-05-27 DIAGNOSIS — K649 Unspecified hemorrhoids: Secondary | ICD-10-CM | POA: Insufficient documentation

## 2017-05-27 LAB — GLUCOSE, POCT (MANUAL RESULT ENTRY): POC Glucose: 277 mg/dl — AB (ref 70–99)

## 2017-05-27 LAB — POCT GLYCOSYLATED HEMOGLOBIN (HGB A1C): HEMOGLOBIN A1C: 8.2

## 2017-05-27 MED ORDER — ACCU-CHEK AVIVA DEVI
0 refills | Status: DC
Start: 2017-05-27 — End: 2017-08-11

## 2017-05-27 MED ORDER — TADALAFIL 5 MG PO TABS: ORAL_TABLET | ORAL | 3 refills | Status: DC

## 2017-05-27 MED ORDER — GLUCOSE BLOOD VI STRP: ORAL_STRIP | 12 refills | Status: DC

## 2017-05-27 MED ORDER — METHOCARBAMOL 500 MG PO TABS: 1000.0000 mg | ORAL_TABLET | Freq: Two times a day (BID) | ORAL | 3 refills | Status: DC

## 2017-05-27 MED ORDER — INSULIN GLARGINE 100 UNIT/ML SOLOSTAR PEN: 32.0000 [IU] | PEN_INJECTOR | Freq: Every day | SUBCUTANEOUS | 3 refills | Status: DC

## 2017-05-27 MED ORDER — PREGABALIN 75 MG PO CAPS: 75.0000 mg | ORAL_CAPSULE | Freq: Two times a day (BID) | ORAL | 3 refills | Status: DC

## 2017-05-27 MED ORDER — ACCU-CHEK SOFTCLIX LANCET DEV KIT: 1.0000 | PACK | Freq: Three times a day (TID) | 6 refills | Status: DC

## 2017-05-27 MED ORDER — TESTOSTERONE 20.25 MG/1.25GM (1.62%) TD GEL: 20.2500 mg | Freq: Every morning | TRANSDERMAL | 5 refills | Status: DC

## 2017-05-27 MED FILL — !CIALIS 5 MG TABLET: 5 | 30 days supply | Qty: 10 | Fill #0

## 2017-05-27 MED FILL — ACCU-CHEK AVIVA PLUS W/DEVI: W/DEVICE | 30 days supply | Qty: 1 | Fill #0

## 2017-05-27 MED FILL — ACCU-CHEK AVIVA PLUS TEST S: 30 days supply | Qty: 100 | Fill #0

## 2017-05-27 MED FILL — ACCU-CHEK SOFTCLIX LANCETS: 30 days supply | Qty: 100 | Fill #0

## 2017-05-27 MED FILL — LANTUS SOLOSTAR 100 UNITS/M: 100 | 28 days supply | Qty: 9 | Fill #0

## 2017-05-27 NOTE — Telephone Encounter (Signed)
Prescription for a RW faxed to [facility].

## 2017-05-27 NOTE — Progress Notes (Signed)
Subjective:  Patient ID: [name], male    DOB: 08-Apr-1966  Age: 52 y.o. 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  He continues to have an unstable, wobbly gait and ambulates with a cane; he had previously been unable to see neurosurgery due to lack of medical coverage but now has Medicaid and would like a referral. Seen by [facility] 2 months ago and I have reviewed their notes-thought to have drug-induced tremors possibly from Depakote or Seroquel but no evidence of Parkinson's. He was also thought not to have dementia as this was suspected to be multifactorial and also possibly from psychiatric conditions but he declined a neurocognitive testing "getting rid of all this diagnosis could affect my disability" as per notes. He would also like to see a specialist for his right shoulder which hurts and has a reduced range of motion.   Right shoulder x-ray from 01/2017 revealed mild right AC joint degenerative changes.  With regards to his diabetes mellitus his fasting sugars have been in the 119-241 range despite compliance with his medications.  He denies visual concerns, hypoglycemia but endorses numbness in his feet . He is unable to tolerate gabapentin - he informs me 'that was what sent him to the hospital'.  He would like a referral to general surgery for excision of hemorrhoids which he states are not painful but bother him when he wipes his buttocks. His bipolar disorder is managed by psychiatry at [facility] and he informs me his psychiatrist was not willing to change his medications after discussion that his tremors could be from his psychotropic medications.  He does have urinary retention and informs me Flomax caused yellow black urine but  he does better on Cialis. He took a fall 2-3 weeks ago and hit his forehead and then fell backward and ever since has noticed an indentation on his occiput with associated neck pain. He is requesting a prescription for a rolling walker with brakes and test strips.  Past Medical History:  Diagnosis Date  . ADD (attention deficit disorder)   . Anxiety   . Arthritis    right hip  . Bipolar 1 disorder ([place])   . Blood in urine   . CKD (chronic kidney disease), stage III ([place])   . Creatinine elevation   . Depression    bipolar [facility]  . Diabetes mellitus without complication ([place])   . Family history of anesthesia complication    pt is unsure , but pt father may have been difficult to arouse   . HCAP (healthcare-associated pneumonia) 10/31/2012  . History of kidney stones   . Hypertension   . Hypogonadism male   . Liver fatty degeneration   . Microscopic hematuria    hereditary s/p Urology eval  . Osteoarthritis of right hip 11/28/2011   2012 2015 s/p THR Severe  Dr Novella Olive    . Pleural effusion 11/02/2012  . Pneumonia 10-2012  . Pneumonia, organism unspecified(486) 11/02/2012  . Polysubstance dependence, non-opioid, in remission ([place])    remote  . Primary osteoarthritis of left hip 05/22/2015  . PTSD (post-traumatic stress disorder)    SOCIAL ANXIETY DISORDER   . Suicide attempt by multiple drug overdose ([place]) 01/11/2016   Grieving his cat's death 2015-07-01  Past Surgical History:  Procedure Laterality Date  . BACK SURGERY    . CLOSED REDUCTION METACARPAL WITH PERCUTANEOUS PINNING Right   . LUMBAR DISC SURGERY    . TONSILLECTOMY    . TOTAL HIP ARTHROPLASTY Right 08/16/2013   Procedure: TOTAL HIP ARTHROPLASTY ANTERIOR APPROACH;  Surgeon: Hessie Dibble, MD;  Location: [facility];  Service: Orthopedics;  Laterality: Right;  . TOTAL HIP ARTHROPLASTY Left 05/22/2015   Procedure: TOTAL HIP ARTHROPLASTY ANTERIOR APPROACH;  Surgeon: Melrose Nakayama, MD;  Location: [facility];  Service:  Orthopedics;  Laterality: Left;     Outpatient Medications Prior to Visit  Medication Sig Dispense Refill  . atorvastatin (LIPITOR) 10 MG tablet Take 1 tablet (10 mg total) by mouth daily. 30 tablet 5  . buPROPion (WELLBUTRIN XL) 300 MG 24 hr tablet Take 1 tablet (300 mg total) by mouth daily. 30 tablet 0  . cyanocobalamin 500 MCG tablet Take 1 tablet (500 mcg total) by mouth daily. 30 tablet 0  . divalproex (DEPAKOTE) 500 MG DR tablet Take 3 tablets (1,500 mg total) by mouth at bedtime. 90 tablet 0  . QUEtiapine (SEROQUEL) 400 MG tablet Take 1 tablet (400 mg total) by mouth at bedtime. 30 tablet 11  . traMADol (ULTRAM) 50 MG tablet TAKE 1 TABLET BY MOUTH AT BEDTIME. 30 tablet 1  . traZODone (DESYREL) 150 MG tablet Take 1 tablet (150 mg total) by mouth at bedtime. 30 tablet 1  . CIALIS 5 MG tablet TAKE 1 TABLET (5 MG TOTAL) BY MOUTH DAILY AS NEEDED FOR ERECTILE DYSFUNCTION. 10 tablet 0  . Insulin Glargine (LANTUS) 100 UNIT/ML Solostar Pen Inject 25 Units into the skin daily at 10 pm. 15 mL 11  . methocarbamol (ROBAXIN) 500 MG tablet Take 2 tablets (1,000 mg total) by mouth 2 (two) times daily. 120 tablet 3  . Testosterone (ANDROGEL) 20.25 MG/1.25GM (1.62%) GEL Apply 20.25 mg topically 3 (three) times daily. Place the topically solution under (1) arm pit alternating with each application. Total daily amount of medication equals (60.75 mg). 150 g 5  . Blood Glucose Monitoring Suppl (ACCU-CHEK GUIDE) w/Device KIT 1 Device by Does not apply route daily. Use to check BS twice a day (Patient not taking: Reported on 02/04/2017) 1 kit 0  . diclofenac sodium (VOLTAREN) 1 % GEL Apply 4 g topically 4 (four) times daily. (Patient not taking: Reported on 05/27/2017) 100 g 1  . glucose blood (ACCU-CHEK GUIDE) test strip 1 each by Other route 2 (two) times daily. Use to check blood sugars twice a day (Patient not taking: Reported on 03/18/2017) 150 each 3  . Insulin Pen Needle (TRUEPLUS PEN NEEDLES) 31G X 5 MM  MISC USE AS DIRECTED (Patient not taking: Reported on 03/19/2017) 100 each 1  . ACCU-CHEK SOFTCLIX LANCETS lancets Use to check blood sugars twice a day (Patient not taking: Reported on 02/04/2017) 200 each 0  . insulin glargine (LANTUS) 100 UNIT/ML injection Inject 0.25 mLs (25 Units total) into the skin at bedtime. (Patient not taking: Reported on 05/27/2017) 30 mL 1  . tamsulosin (FLOMAX) 0.4 MG CAPS capsule Take 1 capsule (0.4 mg total) by mouth at bedtime. (Patient not taking: Reported on 05/27/2017) 30 capsule 3   No facility-administered medications prior to visit.     ROS Review of Systems  Constitutional: Negative for activity change and appetite change.  HENT: Negative for sinus pressure and sore throat.   Eyes: Negative for visual disturbance.  Respiratory: Negative for cough, chest tightness and  shortness of breath.   Cardiovascular: Negative for chest pain and leg swelling.  Gastrointestinal: Negative for abdominal distention, abdominal pain, constipation and diarrhea.  Endocrine: Negative.   Genitourinary: Negative for dysuria.  Musculoskeletal:       See hpi  Skin: Negative for rash.  Allergic/Immunologic: Negative.   Neurological: Positive for tremors and weakness. Negative for light-headedness and numbness.  Psychiatric/Behavioral: Negative for dysphoric mood and suicidal ideas.    Objective:  BP 120/76   Pulse 89   Temp (!) 97.4 F (36.3 C) (Oral)   Ht 6' 5"  (1.956 m)   Wt 206 lb (93.4 kg)   SpO2 98%   BMI 24.43 kg/m   BP/Weight 05/27/2017 03/19/2017 83/25/4982  Systolic BP 641 583 094  Diastolic BP 76 80 76  Wt. (Lbs) 206 202 204.6  BMI 24.43 23.95 24.9      Physical Exam  Constitutional: He is oriented to person, place, and time. He appears well-developed and well-nourished.  Cardiovascular: Normal rate, normal heart sounds and intact distal pulses.  No murmur heard. Pulmonary/Chest: Effort normal and breath sounds normal. He has no wheezes. He has no  rales. He exhibits no tenderness.  Abdominal: Soft. Bowel sounds are normal. He exhibits no distension and no mass. There is no tenderness.  Musculoskeletal: He exhibits tenderness (lumbar spine TTP; positive straight leg raise bilaterally).  Neurological: He is alert and oriented to person, place, and time.  Ataxia, intention tremors  Psychiatric: He has a normal mood and affect.    CMP Latest Ref Rng & Units 02/10/2017 10/11/2016 10/07/2016  Glucose 65 - 99 mg/dL 182(H) 153(H) 184(H)  BUN 6 - 24 mg/dL 25(H) 19 18  Creatinine 0.76 - 1.27 mg/dL 2.06(H) 2.15(H) 1.92(H)  Sodium 134 - 144 mmol/L 144 138 137  Potassium 3.5 - 5.2 mmol/L 4.5 4.5 4.3  Chloride 96 - 106 mmol/L 103 101 101  CO2 20 - 29 mmol/L 25 30 29   Calcium 8.7 - 10.2 mg/dL 9.9 8.9 8.9  Total Protein 6.0 - 8.5 g/dL 7.6 - -  Total Bilirubin 0.0 - 1.2 mg/dL 0.4 - -  Alkaline Phos 39 - 117 IU/L 74 - -  AST 0 - 40 IU/L 14 - -  ALT 0 - 44 IU/L 8 - -    Lipid Panel     Component Value Date/Time   CHOL 141 02/10/2017 0953   TRIG 259 (H) 02/10/2017 0953   HDL 34 (L) 02/10/2017 0953   CHOLHDL 4.1 02/10/2017 0953   CHOLHDL 7 06/02/2016 0844   VLDL 41 (H) 10/03/2015 0434   LDLCALC 55 02/10/2017 0953   LDLDIRECT 72.0 06/02/2016 0844     Lab Results  Component Value Date   HGBA1C 8.2 05/27/2017    Assessment & Plan:   1. Uncontrolled type 2 diabetes mellitus with hypoglycemia, unspecified hypoglycemia coma status ([place]) Controlled with A1c of 8.2 which has trended up from 6.8 previously Increase Lantus from 25 units to 32 units at bedtime Counseled on Diabetic diet, my plate method, 076 minutes of moderate intensity exercise/week Keep blood sugar logs with fasting goals of 80-120 mg/dl, random of less than 180 and in the event of sugars less than 60 mg/dl or greater than 400 mg/dl please notify the clinic ASAP. It is recommended that you undergo annual eye exams and annual foot exams. Pneumonia vaccine is recommended. -  POCT glucose (manual entry) - POCT glycosylated hemoglobin (Hb A1C) - Insulin Glargine (LANTUS) 100 UNIT/ML Solostar Pen; Inject 32 Units into  the skin daily at 10 pm.  Dispense: 15 mL; Refill: 3 - Blood Glucose Monitoring Suppl (ACCU-CHEK AVIVA) device; Use as instructed daily.  Dispense: 1 each; Refill: 0 - glucose blood (ACCU-CHEK AVIVA) test strip; Use as instructed 3 x daily  Dispense: 100 each; Refill: 12 - Lancets Misc. (ACCU-CHEK SOFTCLIX LANCET DEV) KIT; 1 each by Does not apply route 3 (three) times daily with meals.  Dispense: 1 kit; Refill: 6  2. Degenerative disc disease, lumbar Controlled, high risk for falls I have written a prescription for a walker for him - methocarbamol (ROBAXIN) 500 MG tablet; Take 2 tablets (1,000 mg total) by mouth 2 (two) times daily.  Dispense: 120 tablet; Refill: 3 - Ambulatory referral to Spine Surgery  3. Stage 3 chronic kidney disease (HCC) Likely from Diabetic nephropathy - CMP14+EGFR - Ambulatory referral to Nephrology  4. Other hemorrhoids - Ambulatory referral to General Surgery  5. Scalp pain Secondary to fall - DG Skull Complete; Future  6. Bipolar disorder, in partial remission, most recent episode depressed ([place]) Currently on Depakote, trazodone, Seroquel Man[facility] by [facility] at [facility]  7. Ataxia Possibly drug-induced, Parkinson's disease as per neurology   8. Shoulder arthritis Previously prescribed Voltaren gel which he has been advised to resume Unable to use oral NSAIDs due to chronic kidney disease - AMB referral to orthopedics  9. Urinary retention Did not do well on Flomax in the past - tadalafil (CIALIS) 5 MG tablet; TAKE 1 TABLET (5 MG TOTAL) BY MOUTH DAILY  Dispense: 30 tablet; Refill: 3  10. Other diabetic neurological complication associated with type 2 diabetes mellitus ([place]) Patient had a bad reaction to gabapentin as he states he sent him to the hospital in the past We will try Lyrica - pregabalin  (LYRICA) 75 MG capsule; Take 1 capsule (75 mg total) by mouth 2 (two) times daily.  Dispense: 60 capsule; Refill: 3  11. Hypogonadism male - Testosterone (ANDROGEL) 20.25 MG/1.25GM (1.62%) GEL; Apply 20.25 mg topically every morning. Apply topically under arm pit alternating with each application.  Dispense: 1.25 g; Refill: 5 - CBC with Differential/Platelet - PSA, total and free  12. Screening for HIV (human immunodeficiency virus) Will order HIV test as per patient request. - HIV antibody (with reflex)   Meds ordered this encounter  Medications  . methocarbamol (ROBAXIN) 500 MG tablet    Sig: Take 2 tablets (1,000 mg total) by mouth 2 (two) times daily.    Dispense:  120 tablet    Refill:  3    Discontinue previous dose  . tadalafil (CIALIS) 5 MG tablet    Sig: TAKE 1 TABLET (5 MG TOTAL) BY MOUTH DAILY    Dispense:  30 tablet    Refill:  3  . Testosterone (ANDROGEL) 20.25 MG/1.25GM (1.62%) GEL    Sig: Apply 20.25 mg topically every morning. Apply topically under arm pit alternating with each application.    Dispense:  1.25 g    Refill:  5    Dispense Pump  . Insulin Glargine (LANTUS) 100 UNIT/ML Solostar Pen    Sig: Inject 32 Units into the skin daily at 10 pm.    Dispense:  15 mL    Refill:  3    Discontinue previous dose  . pregabalin (LYRICA) 75 MG capsule    Sig: Take 1 capsule (75 mg total) by mouth 2 (two) times daily.    Dispense:  60 capsule    Refill:  3  . Blood Glucose Monitoring  Suppl (ACCU-CHEK AVIVA) device    Sig: Use as instructed daily.    Dispense:  1 each    Refill:  0  . glucose blood (ACCU-CHEK AVIVA) test strip    Sig: Use as instructed 3 x daily    Dispense:  100 each    Refill:  12  . Lancets Misc. (ACCU-CHEK SOFTCLIX LANCET DEV) KIT    Sig: 1 each by Does not apply route 3 (three) times daily with meals.    Dispense:  1 kit    Refill:  6      45 minutes of total face to face time spent and greater than 50% of time spent on counseling on  diagnosis, discussing imaging and test results with the patient and indications for different medications including medication reconciliation with the patient, counseling on lifestyle modifications and coordination of care.   Follow-up: Return in about 3 months (around 08/24/2017) for follow up of chronic medical conditions.     [name] MD

## 2017-05-28 ENCOUNTER — Encounter: Payer: Self-pay | Admitting: Pharmacist

## 2017-05-28 ENCOUNTER — Telehealth: Payer: Self-pay

## 2017-05-28 ENCOUNTER — Telehealth: Payer: Self-pay | Admitting: Family Medicine

## 2017-05-28 ENCOUNTER — Other Ambulatory Visit: Payer: Self-pay | Admitting: Family Medicine

## 2017-05-28 LAB — CMP14+EGFR
A/G RATIO: 1.6 (ref 1.2–2.2)
ALBUMIN: 4.4 g/dL (ref 3.5–5.5)
ALT: 25 IU/L (ref 0–44)
AST: 42 IU/L — AB (ref 0–40)
Alkaline Phosphatase: 88 IU/L (ref 39–117)
BILIRUBIN TOTAL: 0.4 mg/dL (ref 0.0–1.2)
BUN / CREAT RATIO: 14 (ref 9–20)
BUN: 25 mg/dL — ABNORMAL HIGH (ref 6–24)
CHLORIDE: 101 mmol/L (ref 96–106)
CO2: 24 mmol/L (ref 20–29)
CREATININE: 1.85 mg/dL — AB (ref 0.76–1.27)
Calcium: 9.7 mg/dL (ref 8.7–10.2)
GFR calc Af Amer: 48 mL/min/{1.73_m2} — ABNORMAL LOW (ref >59)
GFR calc non Af Amer: 42 mL/min/{1.73_m2} — ABNORMAL LOW (ref >59)
GLOBULIN, TOTAL: 2.7 g/dL (ref 1.5–4.5)
Glucose: 236 mg/dL — ABNORMAL HIGH (ref 65–99)
POTASSIUM: 4.8 mmol/L (ref 3.5–5.2)
SODIUM: 141 mmol/L (ref 134–144)
Total Protein: 7.1 g/dL (ref 6.0–8.5)

## 2017-05-28 LAB — CBC WITH DIFFERENTIAL/PLATELET
Basophils Absolute: 0 10*3/uL (ref 0.0–0.2)
Basos: 1 %
EOS (ABSOLUTE): 0.1 10*3/uL (ref 0.0–0.4)
Eos: 1 %
Hematocrit: 43.5 % (ref 37.5–51.0)
Hemoglobin: 14.8 g/dL (ref 13.0–17.7)
Immature Grans (Abs): 0 10*3/uL (ref 0.0–0.1)
Immature Granulocytes: 1 %
LYMPHS ABS: 1.7 10*3/uL (ref 0.7–3.1)
Lymphs: 44 %
MCH: 31.1 pg (ref 26.6–33.0)
MCHC: 34 g/dL (ref 31.5–35.7)
MCV: 91 fL (ref 79–97)
MONOCYTES: 9 %
MONOS ABS: 0.3 10*3/uL (ref 0.1–0.9)
NEUTROS PCT: 44 %
Neutrophils Absolute: 1.8 10*3/uL (ref 1.4–7.0)
Platelets: 96 10*3/uL — CL (ref 150–379)
RBC: 4.76 x10E6/uL (ref 4.14–5.80)
RDW: 13.3 % (ref 12.3–15.4)
WBC: 3.9 10*3/uL (ref 3.4–10.8)

## 2017-05-28 LAB — PSA, TOTAL AND FREE
PSA FREE PCT: 11.9 %
PSA FREE: 0.89 ng/mL
Prostate Specific Ag, Serum: 7.5 ng/mL — ABNORMAL HIGH (ref 0.0–4.0)

## 2017-05-28 LAB — HIV ANTIBODY (ROUTINE TESTING W REFLEX): HIV SCREEN 4TH GENERATION: NONREACTIVE

## 2017-05-28 NOTE — Progress Notes (Signed)
PA submitted for Cialis for BPH. Pending review by [place] [employer]. Confirmation #[id number] W

## 2017-05-28 NOTE — Telephone Encounter (Signed)
CMA called patient regarding lab results.   Patient understood.   Patient is concern with having no more testosterone and would PCP advice.   Patient stated he wants to know his his blood sugar machine and walker status.

## 2017-05-28 NOTE — Telephone Encounter (Signed)
CMA called patient to inform on PCP advising.   Patient was inform his Blood sugar machine are ready for him to pick up at [facility].  Patient understood and will check Mychart.

## 2017-05-28 NOTE — Telephone Encounter (Signed)
I just saw him yesterday; the DME company will be working on the prescription for his walker which I already wrote.  He will need to check with his pharmacy regarding his testing supplies which I sent electronically. Elevated PSA raises a concern for prostate cancer  hence my recommendation to hold off on his testosterone replacement.

## 2017-05-28 NOTE — Telephone Encounter (Signed)
Call placed to [facility] [phone] regarding patient's referral for rolling walker. Spoke with Daphne and she informed me that referral was received but that unfortunately insurance will not pay for it. Medicaid paid for a rolling walker back in June of 2018. Patient will have to pay out of pocket if new walker is needed. [facility] contacted patient and left him a message.

## 2017-05-28 NOTE — Telephone Encounter (Signed)
-----   Message from [name], MD sent at 05/28/2017  2:09 PM EST ----- Please inform him to discontinue his AndroGel as his PSA level (from his prostate) is elevated and that could be the culprit.  I have sent him a message on 'my chart' with regards to his other labs.

## 2017-06-01 ENCOUNTER — Telehealth: Payer: Self-pay

## 2017-06-01 NOTE — Telephone Encounter (Signed)
Call placed to [facility] to inquire if the patient's insurance will cover a rollator as he was given a RW in 09/2016. Spoke to [place] who stated that his insurance was billed for the RW and the insurance will not pay for another " walking device" at this time even though this is another type of walker. She stated that referral support would follow up and contact the patient. He would need to pay out of pocket for the walker.

## 2017-06-02 NOTE — Telephone Encounter (Signed)
Please could you inform the patient accordingly? Thank you.

## 2017-06-03 NOTE — Telephone Encounter (Signed)
Call placed to the patient to inform him that his insurance will not cover the cost of the RW with the seat because his insurance just paid for a walker less than a year ago. He stated that he did not receive a call from [facility] about this issue.  He noted that it is too expensive for him to purchase the walker through [facility], so he will check [employer].

## 2017-06-10 ENCOUNTER — Ambulatory Visit (INDEPENDENT_AMBULATORY_CARE_PROVIDER_SITE_OTHER): Payer: Medicaid Other | Admitting: Orthopedic Surgery

## 2017-06-10 ENCOUNTER — Ambulatory Visit (INDEPENDENT_AMBULATORY_CARE_PROVIDER_SITE_OTHER): Payer: Medicaid Other

## 2017-06-10 ENCOUNTER — Encounter (INDEPENDENT_AMBULATORY_CARE_PROVIDER_SITE_OTHER): Payer: Self-pay | Admitting: Orthopedic Surgery

## 2017-06-10 DIAGNOSIS — M25511 Pain in right shoulder: Secondary | ICD-10-CM

## 2017-06-10 NOTE — Progress Notes (Signed)
Office Visit Note   Patient: [name]           Date of Birth: 11-Sep-1965           MRN: [id number] Visit Date: 06/10/2017 Requested by: [name], MD [place], [place] [address] PCP: [name], MD  Subjective: Chief Complaint  Patient presents with  . Shoulder Pain    HPI: Dangelo is a patient with right shoulder pain.  He had radiographs done in October which are reviewed but he has had several falls since that time.  Last fall was a month ago.  He is on disability because of back surgeries.  He also is diabetic.  He currently is unemployed.              ROS: All systems reviewed are negative as they relate to the chief complaint within the history of present illness.  Patient denies  fevers or chills.   Assessment & Plan: Visit Diagnoses:  1. Right shoulder pain, unspecified chronicity     Plan: Impression is right shoulder pain with possibly early frozen shoulder with some restriction of range of motion versus rotator cuff pathology.  He does have some limitation of passive range of motion but also a little bit of his graphic change consistent with rotator cuff pathology.  Only slightly weak on exam today.  We should try physical therapy 1 time a week for 6 weeks plus a home exercise program of stretching.  See him back in 6 weeks for decision for or against further imaging such as an MRI arthrogram to evaluate the rotator cuff.  Working diagnosis now is early frozen shoulder secondary diagnosis possible rotator cuff tear.  Follow-Up Instructions: Return in about 6 weeks (around 07/22/2017).   Orders:  Orders Placed This Encounter  Procedures  . XR Shoulder Right  . Ambulatory referral to Physical Therapy   No orders of the defined types were placed in this encounter.     Procedures: No procedures performed   Clinical Data: No additional findings.  Objective: Vital Signs: There were no vitals taken for this visit.  Physical Exam:    Constitutional: Patient appears well-developed HEENT:  Head: Normocephalic Eyes:EOM are normal Neck: Normal range of motion Cardiovascular: Normal rate Pulmonary/chest: Effort normal Neurologic: Patient is alert Skin: Skin is warm Psychiatric: Patient has normal mood and affect    Ortho Exam: Orthopedic exam demonstrates good cervical spine range of motion.  5 out of 5 grip EPL FPL interosseous wrist flexion wrist extension biceps triceps and deltoid strength.  Radial pulses intact bilaterally.  Patient does have some restricted forward flexion of the right shoulder.  About 20 degrees less on the right compared to the left versus the left side.  Rotator cuff strength seems to be intact to infraspinatus supraspinatus and subscap muscle testing.  Not much in the way of coarse grinding or crepitus with active or passive range of motion right versus left shoulder but there is negative O'Brien's testing on the right shoulder and negative impingement signs.  No masses lymphadenopathy or skin changes noted in the right shoulder girdle region  Specialty Comments:  No specialty comments available.  Imaging: Xr Shoulder Right  Result Date: 06/10/2017 AP axillary outlet right shoulder reviewed.  There is a small decrease in the acromiohumeral distance.  No arthritis in the glenohumeral joint or AC joint.  No other fracture or dislocations noted.  Visualized lung fields clear.    PMFS History: Patient Active  Problem List   Diagnosis Date Noted  . Hemorrhoid 05/27/2017  . Shoulder arthritis 05/27/2017  . Diabetic neuropathy ([place]) 05/27/2017  . Degenerative disc disease, lumbar 02/04/2017  . Constipation   . History of seizures 09/20/2016  . Dementia, early onset with advanced brain atrophy for age 57/05/2016  . Acute lower UTI   . Urinary retention   . Acute metabolic encephalopathy 69/67/8938  . HLD (hyperlipidemia) 09/07/2016  . GERD (gastroesophageal reflux disease) 09/07/2016  .  PTSD (post-traumatic stress disorder) 09/07/2016  . Acute encephalopathy 09/07/2016  . Fall 09/07/2016  . Chronic kidney disease   . Ataxia 10/14/2015  . Hypokalemia 10/03/2015  . Hemiparesis ([place])   . Dyslipidemia 05/31/2014  . Insomnia 11/11/2013  . Degenerative joint disease (DJD) of hip 08/16/2013  . Erectile dysfunction 02/17/2011  . Constipation - functional 02/17/2011  . Diabetes type 2, uncontrolled ([place]) 11/06/2010  . Hypogonadism male 05/20/2010  . TOBACCO USER 05/20/2010  . Demoralization and apathy 05/20/2010  . Bipolar disorder ([place]) 01/18/2010  . Depression 01/18/2010  . Right shoulder pain 01/18/2010  . Anxiety state 12/05/2006  . Essential hypertension 12/05/2006   Past Medical History:  Diagnosis Date  . ADD (attention deficit disorder)   . Anxiety   . Arthritis    right hip  . Bipolar 1 disorder ([place])   . Blood in urine   . CKD (chronic kidney disease), stage III ([place])   . Creatinine elevation   . Depression    bipolar [facility]  . Diabetes mellitus without complication ([place])   . Family history of anesthesia complication    pt is unsure , but pt father may have been difficult to arouse   . HCAP (healthcare-associated pneumonia) 10/31/2012  . History of kidney stones   . Hypertension   . Hypogonadism male   . Liver fatty degeneration   . Microscopic hematuria    hereditary s/p Urology eval  . Osteoarthritis of right hip 11/28/2011   2012 2015 s/p THR Severe  Dr Novella Olive    . Pleural effusion 11/02/2012  . Pneumonia 10-2012  . Pneumonia, organism unspecified(486) 11/02/2012  . Polysubstance dependence, non-opioid, in remission ([place])    remote  . Primary osteoarthritis of left hip 05/22/2015  . PTSD (post-traumatic stress disorder)    SOCIAL ANXIETY DISORDER   . Suicide attempt by multiple drug overdose ([place]) 01/09/16   Grieving his cat's death 07-27-2015    Family History  Problem Relation Age of Onset  . Diabetes Father   . Cancer Mother         died of melanoma with mets  . Cervical cancer Sister   . Diabetes Sister   . Other Neg Hx        hypogonadism    Past Surgical History:  Procedure Laterality Date  . BACK SURGERY    . CLOSED REDUCTION METACARPAL WITH PERCUTANEOUS PINNING Right   . LUMBAR DISC SURGERY    . TONSILLECTOMY    . TOTAL HIP ARTHROPLASTY Right 08/16/2013   Procedure: TOTAL HIP ARTHROPLASTY ANTERIOR APPROACH;  Surgeon: Hessie Dibble, MD;  Location: [facility];  Service: Orthopedics;  Laterality: Right;  . TOTAL HIP ARTHROPLASTY Left 05/22/2015   Procedure: TOTAL HIP ARTHROPLASTY ANTERIOR APPROACH;  Surgeon: Melrose Nakayama, MD;  Location: [facility];  Service: Orthopedics;  Laterality: Left;   Social History   Occupational History  . Occupation: disability  Tobacco Use  . Smoking status: Former Smoker    Years: 0.00    Types: Cigarettes  Last attempt to quit: 02/19/2014    Years since quitting: 3.3  . Smokeless tobacco: Never Used  Substance and Sexual Activity  . Alcohol use: No  . Drug use: No    Comment: hx of marijuana/cocaine/crack use but sober since 20's  . Sexual activity: Not Currently

## 2017-06-23 ENCOUNTER — Encounter: Payer: Self-pay | Admitting: Physical Therapy

## 2017-06-23 ENCOUNTER — Other Ambulatory Visit: Payer: Self-pay | Admitting: Family Medicine

## 2017-06-23 ENCOUNTER — Ambulatory Visit: Payer: Medicaid Other | Attending: Family Medicine | Admitting: Physical Therapy

## 2017-06-23 ENCOUNTER — Other Ambulatory Visit: Payer: Self-pay

## 2017-06-23 DIAGNOSIS — M25511 Pain in right shoulder: Secondary | ICD-10-CM | POA: Diagnosis not present

## 2017-06-23 DIAGNOSIS — G8929 Other chronic pain: Secondary | ICD-10-CM | POA: Diagnosis present

## 2017-06-23 DIAGNOSIS — M5136 Other intervertebral disc degeneration, lumbar region: Secondary | ICD-10-CM

## 2017-06-23 DIAGNOSIS — M25611 Stiffness of right shoulder, not elsewhere classified: Secondary | ICD-10-CM | POA: Diagnosis present

## 2017-06-23 MED FILL — METHOCARBAMOL 500 MG TABLET: 500 | 30 days supply | Qty: 120 | Fill #1

## 2017-06-23 MED FILL — LANTUS SOLOSTAR 100 UNITS/M: 100 | 28 days supply | Qty: 9 | Fill #1

## 2017-06-23 MED FILL — ATORVASTATIN 10 MG TABLET: 10 | 30 days supply | Qty: 30 | Fill #4

## 2017-06-23 NOTE — Therapy (Signed)
[place], [place], [address] Phone: [phone]   Fax:  [phone]  Physical Therapy Evaluation  Patient Details  Name: [name] MRN: [id number] Date of Birth: January 12, 1966 Referring Provider: Meredith Pel, MD   Encounter Date: 06/23/2017  PT End of Session - 06/23/17 1200    Visit Number  1    Number of Visits  7    Date for PT Re-Evaluation  07/24/17    Authorization Type  MCD- auth submitted on 3/5    PT Start Time  1145-07-26    PT Stop Time  07-26-1232    PT Time Calculation (min)  47 min    Activity Tolerance  Patient tolerated treatment well    Behavior During Therapy  [facility] for tasks assessed/performed       Past Medical History:  Diagnosis Date  . ADD (attention deficit disorder)   . Anxiety   . Arthritis    right hip  . Bipolar 1 disorder ([place])   . Blood in urine   . CKD (chronic kidney disease), stage III ([place])   . Creatinine elevation   . Depression    bipolar [facility]  . Diabetes mellitus without complication ([place])   . Family history of anesthesia complication    pt is unsure , but pt father may have been difficult to arouse   . HCAP (healthcare-associated pneumonia) 10/31/2012  . History of kidney stones   . Hypertension   . Hypogonadism male   . Liver fatty degeneration   . Microscopic hematuria    hereditary s/p Urology eval  . Osteoarthritis of right hip 11/28/2011   2012 2015 s/p THR Severe  Dr Novella Olive    . Pleural effusion 11/02/2012  . Pneumonia 10-2012  . Pneumonia, organism unspecified(486) 11/02/2012  . Polysubstance dependence, non-opioid, in remission ([place])    remote  . Primary osteoarthritis of left hip 05/22/2015  . PTSD (post-traumatic stress disorder)    SOCIAL ANXIETY DISORDER   . Suicide attempt by multiple drug overdose ([place]) 01/09/16   Grieving his cat's death 07/27/2015    Past Surgical History:  Procedure Laterality Date  . BACK SURGERY    . CLOSED  REDUCTION METACARPAL WITH PERCUTANEOUS PINNING Right   . LUMBAR DISC SURGERY    . TONSILLECTOMY    . TOTAL HIP ARTHROPLASTY Right 08/16/2013   Procedure: TOTAL HIP ARTHROPLASTY ANTERIOR APPROACH;  Surgeon: Hessie Dibble, MD;  Location: [place];  Service: Orthopedics;  Laterality: Right;  . TOTAL HIP ARTHROPLASTY Left 05/22/2015   Procedure: TOTAL HIP ARTHROPLASTY ANTERIOR APPROACH;  Surgeon: Melrose Nakayama, MD;  Location: [place];  Service: Orthopedics;  Laterality: Left;    There were no vitals filed for this visit.   Subjective Assessment - 06/23/17 1201    Subjective  Last year began getting really light headed and fell frequently. Landed on right side from 66ft porch multiple times. Feels that balance is very poor and is unable to balance to bend over and lift objects. About 1.5 mo ago fell on Rt shoulder again and has worsened since then. Was in assisted living for 2.5 mo and is now with sister. Pain with reaching and lifting arm below 90 deg. Uses SPC in Right hand full time.     Patient Stated Goals  reach wallet, roll over in bed    Currently in Pain?  Yes    Pain Score  9  when moving arm    Pain  Location  Shoulder    Pain Orientation  Right    Pain Descriptors / Indicators  -- hurts    Aggravating Factors   shoulder movement.     Pain Relieving Factors  nothing noted, has not tried modalities         [facility] PT Assessment - 06/23/17 0001      Assessment   Medical Diagnosis  Rt shoulder pain    Referring Provider  Marlou Sa Tonna Corner, MD    Onset Date/Surgical Date  -- worst fall end of Jan, 2018.     Hand Dominance  Right    Next MD Visit  6 weeks    Prior Therapy  in assisted living      Precautions   Precautions  Fall      Restrictions   Weight Bearing Restrictions  No      Balance Screen   Has the patient fallen in the past 6 months  Yes    How many times?  multiple    Has the patient had a decrease in activity level because of a fear of falling?   Yes    Is the  patient reluctant to leave their home because of a fear of falling?   Yes      [place] residence    Additional Comments  two story home      Prior Function   Level of Independence  Independent      Cognition   Overall Cognitive Status  Within Functional Limits for tasks assessed      Sensation   Additional Comments  N/T in feet      Posture/Postural Control   Posture Comments  rounded shoulders, forward head, flatting of lumbar spine.       ROM / Strength   AROM / PROM / Strength  AROM      AROM   AROM Assessment Site  Shoulder    Right/Left Shoulder  Right    Right Shoulder Flexion  92 Degrees    Right Shoulder ABduction  54 Degrees    Right Shoulder Internal Rotation  -- unable to reach to pants pocket    Right Shoulder External Rotation  -- occiput      Palpation   Palpation comment  soreness noted in biceps tendon & muscle belly             Objective measurements completed on examination: See above findings.      [facility] Adult PT Treatment/Exercise - 06/23/17 0001      Exercises   Exercises  Other Exercises    Other Exercises   scapular & cervical retraction             PT Education - 06/23/17 1253    Education provided  Yes    Education Details  anatomy of condition, POC, HEP, exercise form/rationale, nero v ortho and treatments available for frequent falls, importance of posture    Person(s) Educated  Patient    Methods  Explanation;Demonstration;Tactile cues;Verbal cues;Handout    Comprehension  Verbalized understanding;Need further instruction;Returned demonstration;Verbal cues required;Tactile cues required       PT Short Term Goals - 06/23/17 1419      PT SHORT TERM GOAL #1   Title  Pt will verbalize ability to utilize scapular retractions to correct posture throughout the day    Baseline  began educating at eval    Time  4 time to accomodate for MCD authorization time  Period  Weeks    Status   New    Target Date  07/24/17      PT SHORT TERM GOAL #2   Title  Pt will be independent with HEP as it has been established and ready for progressions    Baseline  began establishing at eval    Time  4    Period  Weeks    Status  New    Target Date  07/24/17        PT Long Term Goals - 06/23/17 1727      PT LONG TERM GOAL #1   Title  Pt will be able to reach and grab wallet from back pocket pain <=3/10    Baseline  unable at eval    Time  8    Period  Weeks    Status  New    Target Date  08/21/17      PT LONG TERM GOAL #2   Title  Pt will be able to perform bed mobility without interruption by shoulder pain    Baseline  has to make significant accomodations due to pain    Time  8    Period  Weeks    Status  New    Target Date  08/21/17      PT LONG TERM GOAL #3   Title  flexion to 120 deg for functional reach    Baseline  92 deg at eval    Time  8    Period  Weeks    Status  New    Target Date  08/21/17      PT LONG TERM GOAL #4   Title  gross UE strength to 5/5 below 90 deg for necessary support to biomechanical chain    Baseline  3-/5 gross at eval    Time  8    Period  Weeks    Status  New    Target Date  08/21/17             Plan - 06/23/17 1254    Clinical Impression Statement  pt presents to PT with complaints of right shoulder pain that has been present for a little over a year but worsened the most in the last couple of months. Pain also into cervical region. Frequent falls where he lands on his right shoulder. Pt with poor AROM and end range pain. Discussed possibility of partial RC tear vs adhesive capsulitis. Large amount of evaluation was spent educating patient today. Will continue to benefit from skilled PT to improve GHJ mobility and function with decreased pain. Pt would also benefit from future transfer to neuro PT for balance training.     History and Personal Factors relevant to plan of care:  depression, bipolar I, anxiety, frequent falls     Clinical Presentation  Evolving    Clinical Presentation due to:  worsening pain with decreasing ROM, continues to fall frequently    Clinical Decision Making  Moderate    Rehab Potential  Good    PT Frequency  1x / week 1/week 3 weeks in first auth, 1/week for 3 weeks in second auth    PT Duration  3 weeks    PT Treatment/Interventions  ADLs/Self Care Home Management;Cryotherapy;Electrical Stimulation;Ultrasound;Traction;Moist Heat;Iontophoresis 4mg /ml Dexamethasone;Therapeutic activities;Therapeutic exercise;Balance training;Patient/family education;Manual techniques;Passive range of motion;Taping;Dry needling    PT Next Visit Plan  GHJ PROM, periscapular strengthening    PT Home Exercise Plan  scapular & cervical retraction, be aware of posture  Consulted and Agree with Plan of Care  Patient       Patient will benefit from skilled therapeutic intervention in order to improve the following deficits and impairments:  Improper body mechanics, Pain, Postural dysfunction, Increased muscle spasms, Decreased activity tolerance, Decreased range of motion, Decreased strength, Impaired UE functional use  Visit Diagnosis: Chronic right shoulder pain - Plan: PT plan of care cert/re-cert  Stiffness of right shoulder, not elsewhere classified - Plan: PT plan of care cert/re-cert     Problem List Patient Active Problem List   Diagnosis Date Noted  . Hemorrhoid 05/27/2017  . Shoulder arthritis 05/27/2017  . Diabetic neuropathy ([place]) 05/27/2017  . Degenerative disc disease, lumbar 02/04/2017  . Constipation   . History of seizures 09/20/2016  . Dementia, early onset with advanced brain atrophy for age 42/05/2016  . Acute lower UTI   . Urinary retention   . Acute metabolic encephalopathy 36/46/8032  . HLD (hyperlipidemia) 09/07/2016  . GERD (gastroesophageal reflux disease) 09/07/2016  . PTSD (post-traumatic stress disorder) 09/07/2016  . Acute encephalopathy 09/07/2016  . Fall  09/07/2016  . Chronic kidney disease   . Ataxia 10/14/2015  . Hypokalemia 10/03/2015  . Hemiparesis ([place])   . Dyslipidemia 05/31/2014  . Insomnia 11/11/2013  . Degenerative joint disease (DJD) of hip 08/16/2013  . Erectile dysfunction 02/17/2011  . Constipation - functional 02/17/2011  . Diabetes type 2, uncontrolled ([place]) 11/06/2010  . Hypogonadism male 05/20/2010  . TOBACCO USER 05/20/2010  . Demoralization and apathy 05/20/2010  . Bipolar disorder ([place]) 01/18/2010  . Depression 01/18/2010  . Right shoulder pain 01/18/2010  . Anxiety state 12/05/2006  . Essential hypertension 12/05/2006    Mackson Botz C. Dellamae Rosamilia PT, DPT 06/23/17 5:32 PM   [place] [facility] [address] [place], [place], [address] Phone: [phone]   Fax:  [phone]  Name: [name] MRN: [id number] Date of Birth: [birth year]

## 2017-06-24 ENCOUNTER — Telehealth: Payer: Self-pay | Admitting: Family Medicine

## 2017-06-24 MED FILL — !CIALIS 5 MG TABLET: 5 | 30 days supply | Qty: 30 | Fill #0

## 2017-06-24 NOTE — Telephone Encounter (Signed)
Call pt to remind her of her printed traMADol. Please follow up

## 2017-06-26 ENCOUNTER — Encounter: Payer: Self-pay | Admitting: Pharmacist

## 2017-06-26 NOTE — Progress Notes (Signed)
PA submitted and approved for tramadol. Approval [phone]

## 2017-06-29 ENCOUNTER — Telehealth: Payer: Self-pay | Admitting: Family Medicine

## 2017-06-29 NOTE — Telephone Encounter (Signed)
Pt called to let PCP know a PA is supposed to come over for his -traMADol (ULTRAM) 50 MG tablet  To get more than a 7 day supply

## 2017-06-30 ENCOUNTER — Encounter: Payer: Self-pay | Admitting: Physical Therapy

## 2017-07-06 ENCOUNTER — Encounter: Payer: Self-pay | Admitting: Physical Therapy

## 2017-07-06 ENCOUNTER — Ambulatory Visit: Payer: Medicaid Other | Admitting: Physical Therapy

## 2017-07-06 DIAGNOSIS — G8929 Other chronic pain: Secondary | ICD-10-CM

## 2017-07-06 DIAGNOSIS — M25611 Stiffness of right shoulder, not elsewhere classified: Secondary | ICD-10-CM

## 2017-07-06 DIAGNOSIS — M25511 Pain in right shoulder: Secondary | ICD-10-CM | POA: Diagnosis not present

## 2017-07-06 NOTE — Therapy (Signed)
[facility] [address], [place], [place], [address] Phone: [phone]   Fax:  [phone]  Physical Therapy Treatment  Patient Details  Name: [name] MRN: [id number] Date of Birth: 31-Jan-1966 Referring Provider: Meredith Pel, MD   Encounter Date: 07/06/2017  PT End of Session - 07/06/17 1229    Visit Number  2    Number of Visits  4    Authorization - Visit Number  1    Authorization - Number of Visits  3    PT Start Time  3419    PT Stop Time  1226/07/30    PT Time Calculation (min)  41 min    Activity Tolerance  No increased pain;Patient tolerated treatment well    Behavior During Therapy  [facility] for tasks assessed/performed       Past Medical History:  Diagnosis Date  . ADD (attention deficit disorder)   . Anxiety   . Arthritis    right hip  . Bipolar 1 disorder ([place])   . Blood in urine   . CKD (chronic kidney disease), stage III ([place])   . Creatinine elevation   . Depression    bipolar [facility]  . Diabetes mellitus without complication ([place])   . Family history of anesthesia complication    pt is unsure , but pt father may have been difficult to arouse   . HCAP (healthcare-associated pneumonia) 10/31/2012  . History of kidney stones   . Hypertension   . Hypogonadism male   . Liver fatty degeneration   . Microscopic hematuria    hereditary s/p Urology eval  . Osteoarthritis of right hip 11/28/2011   2012 2015 s/p THR Severe  Dr Novella Olive    . Pleural effusion 11/02/2012  . Pneumonia 10-2012  . Pneumonia, organism unspecified([place]) 11/02/2012  . Polysubstance dependence, non-opioid, in remission ([place])    remote  . Primary osteoarthritis of left hip 05/22/2015  . PTSD (post-traumatic stress disorder)    SOCIAL ANXIETY DISORDER   . Suicide attempt by multiple drug overdose ([place]) January 12, 2016   Grieving his cat's death 07-30-2015    Past Surgical History:  Procedure Laterality Date  . BACK SURGERY     . CLOSED REDUCTION METACARPAL WITH PERCUTANEOUS PINNING Right   . LUMBAR DISC SURGERY    . TONSILLECTOMY    . TOTAL HIP ARTHROPLASTY Right 08/16/2013   Procedure: TOTAL HIP ARTHROPLASTY ANTERIOR APPROACH;  Surgeon: Hessie Dibble, MD;  Location: [place];  Service: Orthopedics;  Laterality: Right;  . TOTAL HIP ARTHROPLASTY Left 05/22/2015   Procedure: TOTAL HIP ARTHROPLASTY ANTERIOR APPROACH;  Surgeon: Melrose Nakayama, MD;  Location: [facility];  Service: Orthopedics;  Laterality: Left;    There were no vitals filed for this visit.  Subjective Assessment - 07/06/17 1150    Subjective  Pt reports that he has had multiple falls which lead to his Rt shoulder pain and stiffness. He is transfering to this clinic because he is closer to this clinic. He says the pain in the morning is usually worse but it will get better as the day goes.     Patient Stated Goals  reach wallet, roll over in bed    Currently in Pain?  Yes    Pain Score  8     Pain Location  Shoulder    Pain Orientation  Right    Pain Descriptors / Indicators  Aching;Throbbing    Pain Type  Chronic pain  Pain Radiating Towards  none     Pain Onset  More than a month ago    Pain Frequency  Constant    Aggravating Factors   shoulder movement     Pain Relieving Factors  not sure, has felt good with heat in the past     Effect of Pain on Daily Activities  unable to use RUE much                      [facility] Adult PT Treatment/Exercise - 07/06/17 0001      Self-Care   Self-Care  Posture;Heat/Ice Application    Posture  propping pillow underneath arm while sleeping or sitting for long periods of time to decrease pain    Heat/Ice Application  use of modalities throughout the day for pain relief; ice parameters       Exercises   Exercises  Shoulder      Shoulder Exercises: Supine   Flexion  AAROM;Both;15 reps    Flexion Limitations  with SPC    ABduction  Right;AAROM;15 reps    ABduction Limitations  heavy cuing from  therapist to improve mechanics      Shoulder Exercises: Seated   Other Seated Exercises  shoulder rolls x20 reps forward/backwards      Shoulder Exercises: Standing   External Rotation  15 reps;AAROM;Right    External Rotation Limitations  therapist cuing to improve mechanics       Shoulder Exercises: Pulleys   Flexion  3 minutes    Scaption  3 minutes             PT Education - 07/06/17 1229    Education provided  Yes    Education Details  importance of completing HEP several times a day; technique with therex; addition to HEP    Person(s) Educated  Patient    Methods  Explanation;Verbal cues;Handout;Tactile cues    Comprehension  Verbalized understanding;Returned demonstration       PT Short Term Goals - 06/23/17 1419      PT SHORT TERM GOAL #1   Title  Pt will verbalize ability to utilize scapular retractions to correct posture throughout the day    Baseline  began educating at eval    Time  4 time to accomodate for MCD authorization time    Period  Weeks    Status  New    Target Date  07/24/17      PT SHORT TERM GOAL #2   Title  Pt will be independent with HEP as it has been established and ready for progressions    Baseline  began establishing at eval    Time  4    Period  Weeks    Status  New    Target Date  07/24/17        PT Long Term Goals - 06/23/17 1727      PT LONG TERM GOAL #1   Title  Pt will be able to reach and grab wallet from back pocket pain <=3/10    Baseline  unable at eval    Time  8    Period  Weeks    Status  New    Target Date  08/21/17      PT LONG TERM GOAL #2   Title  Pt will be able to perform bed mobility without interruption by shoulder pain    Baseline  has to make significant accomodations due to pain    Time  8    Period  Weeks    Status  New    Target Date  08/21/17      PT LONG TERM GOAL #3   Title  flexion to 120 deg for functional reach    Baseline  92 deg at eval    Time  8    Period  Weeks    Status  New     Target Date  08/21/17      PT LONG TERM GOAL #4   Title  gross UE strength to 5/5 below 90 deg for necessary support to biomechanical chain    Baseline  3-/5 gross at eval    Time  8    Period  Weeks    Status  New    Target Date  08/21/17            Plan - 07/06/17 1230    Clinical Impression Statement  Pt arrived with continued Rt shoulder pain. Completed therex to improve Rt shoulder ROM, pt requiring heavy verbal and tactile cuing to improve mechanics with today's exercises. Pt ended session with good understanding of HEP additions and was encouraged to complete HEP consistently at home. Also addressed pain management techniques that pt can utilize at home. Will follow up on adherence with this next session.     Rehab Potential  Good    PT Frequency  1x / week 1/week 3 weeks in first auth, 1/week for 3 weeks in second auth    PT Duration  3 weeks    PT Treatment/Interventions  ADLs/Self Care Home Management;Cryotherapy;Electrical Stimulation;Ultrasound;Traction;Moist Heat;Iontophoresis 4mg /ml Dexamethasone;Therapeutic activities;Therapeutic exercise;Balance training;Patient/family education;Manual techniques;Passive range of motion;Taping;Dry needling    PT Next Visit Plan  Rt shoulder PROM, periscapular strengthening, active assisted ROM    PT Home Exercise Plan  scapular & cervical retraction, be aware of posture, added supine AAROM flexion and abduction    Consulted and Agree with Plan of Care  Patient       Patient will benefit from skilled therapeutic intervention in order to improve the following deficits and impairments:  Improper body mechanics, Pain, Postural dysfunction, Increased muscle spasms, Decreased activity tolerance, Decreased range of motion, Decreased strength, Impaired UE functional use  Visit Diagnosis: Chronic right shoulder pain  Stiffness of right shoulder, not elsewhere classified     Problem List Patient Active Problem List   Diagnosis Date  Noted  . Hemorrhoid 05/27/2017  . Shoulder arthritis 05/27/2017  . Diabetic neuropathy ([place]) 05/27/2017  . Degenerative disc disease, lumbar 02/04/2017  . Constipation   . History of seizures 09/20/2016  . Dementia, early onset with advanced brain atrophy for age 22/05/2016  . Acute lower UTI   . Urinary retention   . Acute metabolic encephalopathy 12/45/8099  . HLD (hyperlipidemia) 09/07/2016  . GERD (gastroesophageal reflux disease) 09/07/2016  . PTSD (post-traumatic stress disorder) 09/07/2016  . Acute encephalopathy 09/07/2016  . Fall 09/07/2016  . Chronic kidney disease   . Ataxia 10/14/2015  . Hypokalemia 10/03/2015  . Hemiparesis ([place])   . Dyslipidemia 05/31/2014  . Insomnia 11/11/2013  . Degenerative joint disease (DJD) of hip 08/16/2013  . Erectile dysfunction 02/17/2011  . Constipation - functional 02/17/2011  . Diabetes type 2, uncontrolled ([place]) 11/06/2010  . Hypogonadism male 05/20/2010  . TOBACCO USER 05/20/2010  . Demoralization and apathy 05/20/2010  . Bipolar disorder ([place]) 01/18/2010  . Depression 01/18/2010  . Right shoulder pain 01/18/2010  . Anxiety state 12/05/2006  . Essential hypertension 12/05/2006  1:07 PM,07/06/17 [place] [place] [facility] [place]  [facility] [address], [place], [place], [address] Phone: [phone]   Fax:  [phone]  Name: [name] MRN: [id number] Date of Birth: [birth year]

## 2017-07-06 NOTE — Patient Instructions (Signed)
   Cane Active Shoulder Flexion in supine  Grasp the cane about shoulder width. Bring it up over your head, trying to stretch the affected shoulder, holding for a 2 count and then bring the cane back to the resting position.   x15 reps      Cane Supine Shoulder Abduction Stretch  Lie on your back and grasp the cane on the shaft with the arm to be stretched. Grasp the bottom with the other hand and push your arm (the one to be stretched) up and away. Hold the stretch for 2 seconds before returning to the resting position.     x10 reps   [facility] [address] [place], [place] [address] Phone # [phone] Fax [phone]

## 2017-07-13 ENCOUNTER — Ambulatory Visit: Payer: Medicaid Other | Admitting: Physical Therapy

## 2017-07-13 ENCOUNTER — Encounter: Payer: Self-pay | Admitting: Physical Therapy

## 2017-07-13 DIAGNOSIS — M25511 Pain in right shoulder: Principal | ICD-10-CM

## 2017-07-13 DIAGNOSIS — M25611 Stiffness of right shoulder, not elsewhere classified: Secondary | ICD-10-CM

## 2017-07-13 DIAGNOSIS — G8929 Other chronic pain: Secondary | ICD-10-CM

## 2017-07-13 NOTE — Therapy (Signed)
[facility] [address], [place], [place], [address] Phone: [phone]   Fax:  [phone]  Physical Therapy Treatment  Patient Details  Name: [name] MRN: [id number] Date of Birth: 12/29/1965 Referring Provider: Meredith Pel, MD   Encounter Date: 07/13/2017  PT End of Session - 07/13/17 1500    Visit Number  3    Number of Visits  4    Date for PT Re-Evaluation  07/24/17    Authorization Type  MCD- 3/11-4/7; 3 visits approved    Authorization - Visit Number  2    Authorization - Number of Visits  3    PT Start Time  1601    PT Stop Time  1510    PT Time Calculation (min)  53 min    Activity Tolerance  No increased pain;Patient tolerated treatment well    Behavior During Therapy  [facility] for tasks assessed/performed       Past Medical History:  Diagnosis Date  . ADD (attention deficit disorder)   . Anxiety   . Arthritis    right hip  . Bipolar 1 disorder ([place])   . Blood in urine   . CKD (chronic kidney disease), stage III ([place])   . Creatinine elevation   . Depression    bipolar [facility]  . Diabetes mellitus without complication ([place])   . Family history of anesthesia complication    pt is unsure , but pt father may have been difficult to arouse   . HCAP (healthcare-associated pneumonia) 10/31/2012  . History of kidney stones   . Hypertension   . Hypogonadism male   . Liver fatty degeneration   . Microscopic hematuria    hereditary s/p Urology eval  . Osteoarthritis of right hip 11/28/2011   2012 2015 s/p THR Severe  Dr Novella Olive    . Pleural effusion 11/02/2012  . Pneumonia 10-2012  . Pneumonia, organism unspecified(486) 11/02/2012  . Polysubstance dependence, non-opioid, in remission ([place])    remote  . Primary osteoarthritis of left hip 05/22/2015  . PTSD (post-traumatic stress disorder)    SOCIAL ANXIETY DISORDER   . Suicide attempt by multiple drug overdose ([place]) 01/10/16   Grieving  his cat's death 2015-07-28    Past Surgical History:  Procedure Laterality Date  . BACK SURGERY    . CLOSED REDUCTION METACARPAL WITH PERCUTANEOUS PINNING Right   . LUMBAR DISC SURGERY    . TONSILLECTOMY    . TOTAL HIP ARTHROPLASTY Right 08/16/2013   Procedure: TOTAL HIP ARTHROPLASTY ANTERIOR APPROACH;  Surgeon: Hessie Dibble, MD;  Location: [place];  Service: Orthopedics;  Laterality: Right;  . TOTAL HIP ARTHROPLASTY Left 05/22/2015   Procedure: TOTAL HIP ARTHROPLASTY ANTERIOR APPROACH;  Surgeon: Melrose Nakayama, MD;  Location: [place];  Service: Orthopedics;  Laterality: Left;    There were no vitals filed for this visit.  Subjective Assessment - 07/13/17 1418    Subjective  fell yesterday on Rt shoulder (1st fall in 6-7 months).  reports he slipped on stairs and fell backwards, and that he wasn't looking at the stairs so missed the step.  denies any injury but feels pretty sore and uncomfortable.    Patient Stated Goals  reach wallet, roll over in bed    Currently in Pain?  Yes    Pain Score  0-No pain none at rest; up to 8/10 with movement    Pain Location  Shoulder    Pain Orientation  Right  Pain Descriptors / Indicators  Aching;Throbbing    Pain Type  Chronic pain    Pain Onset  More than a month ago    Pain Frequency  Constant    Aggravating Factors   shoulder movement    Pain Relieving Factors  nothing, medication                No data recorded       [facility] Adult PT Treatment/Exercise - 07/13/17 1426      Self-Care   Self-Care  Other Self-Care Comments    Other Self-Care Comments   discussed recent fall and cause of fall (pt not looking at steps when descending) and how to be safer at home.  pt verbalized understanding.  also discussed shoulder following fall and pt denies any further injury or pain and even reports it feels "a little better"      Shoulder Exercises: Supine   Protraction  Right;15 reps;Weights    Protraction Weight (lbs)  3    Flexion   AAROM;Both;15 reps    Flexion Limitations  with SPC      Shoulder Exercises: Seated   Other Seated Exercises  shoulder rolls x20 reps forward/backwards      Shoulder Exercises: Standing   Retraction  Both;10 reps;Theraband mod cues for technique    Theraband Level (Shoulder Retraction)  Level 3 (Green)      Shoulder Exercises: Pulleys   Flexion  3 minutes    Scaption  3 minutes      Shoulder Exercises: Therapy Ball   Flexion  5 reps 10 sec    Scaption  Right;Left;5 reps 10 sec      Modalities   Modalities  Electrical Stimulation;Moist Heat      Moist Heat Therapy   Number Minutes Moist Heat  15 Minutes    Moist Heat Location  Shoulder      Electrical Stimulation   Electrical Stimulation Location  Rt shoulder    Electrical Stimulation Action  IFC to tolerance x 15 min    Electrical Stimulation Goals  Pain               PT Short Term Goals - 06/23/17 1419      PT SHORT TERM GOAL #1   Title  Pt will verbalize ability to utilize scapular retractions to correct posture throughout the day    Baseline  began educating at eval    Time  4 time to accomodate for MCD authorization time    Period  Weeks    Status  New    Target Date  07/24/17      PT SHORT TERM GOAL #2   Title  Pt will be independent with HEP as it has been established and ready for progressions    Baseline  began establishing at eval    Time  4    Period  Weeks    Status  New    Target Date  07/24/17        PT Long Term Goals - 06/23/17 1727      PT LONG TERM GOAL #1   Title  Pt will be able to reach and grab wallet from back pocket pain <=3/10    Baseline  unable at eval    Time  8    Period  Weeks    Status  New    Target Date  08/21/17      PT LONG TERM GOAL #2   Title  Pt will  be able to perform bed mobility without interruption by shoulder pain    Baseline  has to make significant accomodations due to pain    Time  8    Period  Weeks    Status  New    Target Date  08/21/17       PT LONG TERM GOAL #3   Title  flexion to 120 deg for functional reach    Baseline  92 deg at eval    Time  8    Period  Weeks    Status  New    Target Date  08/21/17      PT LONG TERM GOAL #4   Title  gross UE strength to 5/5 below 90 deg for necessary support to biomechanical chain    Baseline  3-/5 gross at eval    Time  8    Period  Weeks    Status  New    Target Date  08/21/17            Plan - 07/13/17 1501    Clinical Impression Statement  Pt tolerated session well today but needs continued cues for proper form and technique, using substitution patterns for most exercises.  Pt also with fall yesterday initially stating he was in a lot of pain but then reporting pain is unchanged and that Rt shoulder even feels a little better.  Will continue to benefit from PT to maximize function.    Rehab Potential  Good    PT Frequency  1x / week 1/week 3 weeks in first auth, 1/week for 3 weeks in second auth    PT Duration  3 weeks    PT Treatment/Interventions  ADLs/Self Care Home Management;Cryotherapy;Electrical Stimulation;Ultrasound;Traction;Moist Heat;Iontophoresis 4mg /ml Dexamethasone;Therapeutic activities;Therapeutic exercise;Balance training;Patient/family education;Manual techniques;Passive range of motion;Taping;Dry needling    PT Next Visit Plan  Rt shoulder PROM, periscapular strengthening, active assisted ROM; check STGs and plan to submit for recert    PT Home Exercise Plan  scapular & cervical retraction, be aware of posture, added supine AAROM flexion and abduction    Consulted and Agree with Plan of Care  Patient       Patient will benefit from skilled therapeutic intervention in order to improve the following deficits and impairments:  Improper body mechanics, Pain, Postural dysfunction, Increased muscle spasms, Decreased activity tolerance, Decreased range of motion, Decreased strength, Impaired UE functional use  Visit Diagnosis: Chronic right shoulder  pain  Stiffness of right shoulder, not elsewhere classified     Problem List Patient Active Problem List   Diagnosis Date Noted  . Hemorrhoid 05/27/2017  . Shoulder arthritis 05/27/2017  . Diabetic neuropathy ([place]) 05/27/2017  . Degenerative disc disease, lumbar 02/04/2017  . Constipation   . History of seizures 09/20/2016  . Dementia, early onset with advanced brain atrophy for age 24/05/2016  . Acute lower UTI   . Urinary retention   . Acute metabolic encephalopathy 66/09/3014  . HLD (hyperlipidemia) 09/07/2016  . GERD (gastroesophageal reflux disease) 09/07/2016  . PTSD (post-traumatic stress disorder) 09/07/2016  . Acute encephalopathy 09/07/2016  . Fall 09/07/2016  . Chronic kidney disease   . Ataxia 10/14/2015  . Hypokalemia 10/03/2015  . Hemiparesis ([place])   . Dyslipidemia 05/31/2014  . Insomnia 11/11/2013  . Degenerative joint disease (DJD) of hip 08/16/2013  . Erectile dysfunction 02/17/2011  . Constipation - functional 02/17/2011  . Diabetes type 2, uncontrolled ([place]) 11/06/2010  . Hypogonadism male 05/20/2010  . TOBACCO USER 05/20/2010  . Demoralization  and apathy 05/20/2010  . Bipolar disorder ([place]) 01/18/2010  . Depression 01/18/2010  . Right shoulder pain 01/18/2010  . Anxiety state 12/05/2006  . Essential hypertension 12/05/2006      Laureen Abrahams, PT, DPT 07/13/17 3:03 PM     [facility] [address], [place], [place], [address] Phone: [phone]   Fax:  [phone]  Name: EMRICK HENSCH MRN: [id number] Date of Birth: Mar 24, 1966

## 2017-07-20 ENCOUNTER — Encounter: Payer: Self-pay | Admitting: Physical Therapy

## 2017-07-20 ENCOUNTER — Ambulatory Visit: Payer: Medicaid Other | Attending: Family Medicine | Admitting: Physical Therapy

## 2017-07-20 DIAGNOSIS — M25611 Stiffness of right shoulder, not elsewhere classified: Secondary | ICD-10-CM | POA: Insufficient documentation

## 2017-07-20 DIAGNOSIS — G8929 Other chronic pain: Secondary | ICD-10-CM | POA: Insufficient documentation

## 2017-07-20 DIAGNOSIS — M25511 Pain in right shoulder: Secondary | ICD-10-CM | POA: Diagnosis not present

## 2017-07-20 MED FILL — ATORVASTATIN 10 MG TABLET: 10 | 30 days supply | Qty: 30 | Fill #5

## 2017-07-20 MED FILL — !CIALIS 5 MG TABLET: 5 | 30 days supply | Qty: 30 | Fill #1

## 2017-07-20 MED FILL — METHOCARBAMOL 500 MG TABLET: 500 | 30 days supply | Qty: 120 | Fill #2

## 2017-07-20 MED FILL — LANTUS SOLOSTAR 100 UNITS/M: 100 | 28 days supply | Qty: 9 | Fill #2

## 2017-07-20 NOTE — Therapy (Signed)
[facility] [address] [place], [place], [address] Phone: [phone]   Fax:  [phone]  Physical Therapy Treatment  Patient Details  Name: [name] MRN: [id number] Date of Birth: [birth year] Referring Provider: Meredith Pel, MD   Encounter Date: 07/20/2017  PT End of Session - 07/20/17 1518    Visit Number  4    Number of Visits  7    Date for PT Re-Evaluation  08/17/17    Authorization Type  MCD- 3/11-4/7; 3 visits approved    Authorization - Visit Number  3    Authorization - Number of Visits  3    PT Start Time  7106    PT Stop Time  1528    PT Time Calculation (min)  57 min    Activity Tolerance  No increased pain;Patient tolerated treatment well    Behavior During Therapy  [facility] for tasks assessed/performed       Past Medical History:  Diagnosis Date  . ADD (attention deficit disorder)   . Anxiety   . Arthritis    right hip  . Bipolar 1 disorder ([place])   . Blood in urine   . CKD (chronic kidney disease), stage III ([place])   . Creatinine elevation   . Depression    bipolar [facility]  . Diabetes mellitus without complication ([place])   . Family history of anesthesia complication    pt is unsure , but pt father may have been difficult to arouse   . HCAP (healthcare-associated pneumonia) 10/31/2012  . History of kidney stones   . Hypertension   . Hypogonadism male   . Liver fatty degeneration   . Microscopic hematuria    hereditary s/p Urology eval  . Osteoarthritis of right hip 11/28/2011   2012 2015 s/p THR Severe  Dr Novella Olive    . Pleural effusion 11/02/2012  . Pneumonia 10-2012  . Pneumonia, organism unspecified(486) 11/02/2012  . Polysubstance dependence, non-opioid, in remission ([place])    remote  . Primary osteoarthritis of left hip 05/22/2015  . PTSD (post-traumatic stress disorder)    SOCIAL ANXIETY DISORDER   . Suicide attempt by multiple drug overdose ([place]) 01-06-2016   Grieving  his cat's death 06/09/15    Past Surgical History:  Procedure Laterality Date  . BACK SURGERY    . CLOSED REDUCTION METACARPAL WITH PERCUTANEOUS PINNING Right   . LUMBAR DISC SURGERY    . TONSILLECTOMY    . TOTAL HIP ARTHROPLASTY Right 08/16/2013   Procedure: TOTAL HIP ARTHROPLASTY ANTERIOR APPROACH;  Surgeon: Hessie Dibble, MD;  Location: [place];  Service: Orthopedics;  Laterality: Right;  . TOTAL HIP ARTHROPLASTY Left 05/22/2015   Procedure: TOTAL HIP ARTHROPLASTY ANTERIOR APPROACH;  Surgeon: Melrose Nakayama, MD;  Location: [place];  Service: Orthopedics;  Laterality: Left;    There were no vitals filed for this visit.  Subjective Assessment - 07/20/17 1434    Subjective  feels Rt shoulder is improving; still having some pain but seems to be getting better.    Patient Stated Goals  reach wallet, roll over in bed    Currently in Pain?  Yes    Pain Score  4     Pain Location  Shoulder    Pain Orientation  Right    Pain Descriptors / Indicators  Aching;Throbbing    Pain Type  Chronic pain    Pain Onset  More than a month ago    Pain Frequency  Constant  Aggravating Factors   shoulder movement    Pain Relieving Factors  medication, exercises         [facility] PT Assessment - 07/20/17 1458      AROM   Right Shoulder Flexion  138 Degrees    Right Shoulder Internal Rotation  -- L4/5                   [facility] Adult PT Treatment/Exercise - 07/20/17 1435      Shoulder Exercises: Supine   Flexion  AAROM;Both;15 reps    Flexion Limitations  with SPC    ABduction  Right;AAROM;15 reps    ABduction Limitations  heavy cuing from therapist to improve mechanics    Other Supine Exercises  scapular retraction x 15 reps; cervical retraction x 15 reps      Shoulder Exercises: Seated   Retraction  Both;10 reps with cervical retraction x 10 reps; mod cues for technique      Shoulder Exercises: Pulleys   Flexion  3 minutes    Scaption  3 minutes      Shoulder Exercises:  ROM/Strengthening   UBE (Upper Arm Bike)  L1 x 6 min (3' fwd/3' bwd)      Moist Heat Therapy   Number Minutes Moist Heat  15 Minutes    Moist Heat Location  Shoulder      Electrical Stimulation   Electrical Stimulation Location  Rt shoulder    Electrical Stimulation Action  IFC to tolerance x 15 min    Electrical Stimulation Goals  Pain               PT Short Term Goals - 07/20/17 1514      PT SHORT TERM GOAL #1   Title  Pt will verbalize ability to utilize scapular retractions to correct posture throughout the day    Status  Achieved      PT SHORT TERM GOAL #2   Title  Pt will be independent with HEP as it has been established and ready for progressions    Baseline  4/1: needed cues for abduction with cane    Status  Partially Met        PT Long Term Goals - 07/20/17 1515      PT LONG TERM GOAL #1   Title  Pt will be able to reach and grab wallet from back pocket pain <=3/10    Baseline  unable at eval    Time  8    Period  Weeks    Status  Achieved      PT LONG TERM GOAL #2   Title  Pt will be able to perform bed mobility without interruption by shoulder pain    Baseline  has to make significant accomodations due to pain    Time  8    Period  Weeks    Status  On-going    Target Date  08/21/17      PT LONG TERM GOAL #3   Title  flexion to 120 deg for functional reach    Baseline  92 deg at eval    Time  8    Period  Weeks    Status  Achieved      PT LONG TERM GOAL #4   Title  gross UE strength to 5/5 below 90 deg for necessary support to biomechanical chain    Baseline  3-/5 gross at eval    Time  8    Period  Weeks  Status  On-going    Target Date  08/21/17      PT LONG TERM GOAL #5   Title  improve flexion to 145 degrees with pain < 3/10 for improved function    Status  New    Target Date  08/21/17      PT LONG TERM GOAL #6   Title   Pt will be able to reach and grab T12/L1 for improved function    Target Date  08/21/17             Plan - 07/20/17 1516    Clinical Impression Statement  Pt has met STGs and met 2 LTGs today, which were revised today.  Recommend continued PT 1x/wk x 3 wks to address remaining LTGs.      Rehab Potential  Good    PT Frequency  1x / week 1/week 3 weeks in first auth, 1/week for 3 weeks in second auth    PT Duration  3 weeks    PT Treatment/Interventions  ADLs/Self Care Home Management;Cryotherapy;Electrical Stimulation;Ultrasound;Traction;Moist Heat;Iontophoresis 26m/ml Dexamethasone;Therapeutic activities;Therapeutic exercise;Balance training;Patient/family education;Manual techniques;Passive range of motion;Taping;Dry needling    PT Next Visit Plan  Rt shoulder PROM, periscapular strengthening, active assisted ROM; check STGs and plan to submit for recert    PT Home Exercise Plan  scapular & cervical retraction, be aware of posture, added supine AAROM flexion and abduction    Consulted and Agree with Plan of Care  Patient       Patient will benefit from skilled therapeutic intervention in order to improve the following deficits and impairments:  Improper body mechanics, Pain, Postural dysfunction, Increased muscle spasms, Decreased activity tolerance, Decreased range of motion, Decreased strength, Impaired UE functional use  Visit Diagnosis: Chronic right shoulder pain  Stiffness of right shoulder, not elsewhere classified     Problem List Patient Active Problem List   Diagnosis Date Noted  . Hemorrhoid 05/27/2017  . Shoulder arthritis 05/27/2017  . Diabetic neuropathy ([place] 05/27/2017  . Degenerative disc disease, lumbar 02/04/2017  . Constipation   . History of seizures 09/20/2016  . Dementia, early onset with advanced brain atrophy for age 34/05/2016  . Acute lower UTI   . Urinary retention   . Acute metabolic encephalopathy 016/38/4665 . HLD (hyperlipidemia) 09/07/2016  . GERD (gastroesophageal reflux disease) 09/07/2016  . PTSD (post-traumatic stress  disorder) 09/07/2016  . Acute encephalopathy 09/07/2016  . Fall 09/07/2016  . Chronic kidney disease   . Ataxia 10/14/2015  . Hypokalemia 10/03/2015  . Hemiparesis ([place]   . Dyslipidemia 05/31/2014  . Insomnia 11/11/2013  . Degenerative joint disease (DJD) of hip 08/16/2013  . Erectile dysfunction 02/17/2011  . Constipation - functional 02/17/2011  . Diabetes type 2, uncontrolled ([place] 11/06/2010  . Hypogonadism male 05/20/2010  . TOBACCO USER 05/20/2010  . Demoralization and apathy 05/20/2010  . Bipolar disorder ([place] 01/18/2010  . Depression 01/18/2010  . Right shoulder pain 01/18/2010  . Anxiety state 12/05/2006  . Essential hypertension 12/05/2006      SLaureen Abrahams PT, DPT 07/20/17 3:19 PM      [facility] [address] [place] [address]: [phone]  Fax:  [phone] Name: KMANDELL PANGBORNMRN: [id number] of Birth: 812-18-1967

## 2017-07-24 ENCOUNTER — Encounter: Payer: Self-pay | Admitting: Family Medicine

## 2017-07-24 DIAGNOSIS — N400 Enlarged prostate without lower urinary tract symptoms: Secondary | ICD-10-CM | POA: Insufficient documentation

## 2017-08-02 ENCOUNTER — Emergency Department (HOSPITAL_COMMUNITY): Payer: Medicaid Other

## 2017-08-02 ENCOUNTER — Encounter (HOSPITAL_COMMUNITY): Payer: Self-pay | Admitting: *Deleted

## 2017-08-02 ENCOUNTER — Inpatient Hospital Stay (HOSPITAL_COMMUNITY)
Admission: EM | Admit: 2017-08-02 | Discharge: 2017-08-11 | DRG: 563 | Disposition: A | Discharge status: Skilled Nursing Facility | Payer: Medicaid Other | Attending: Internal Medicine | Admitting: Internal Medicine

## 2017-08-02 DIAGNOSIS — E875 Hyperkalemia: Secondary | ICD-10-CM | POA: Diagnosis not present

## 2017-08-02 DIAGNOSIS — R296 Repeated falls: Secondary | ICD-10-CM | POA: Diagnosis present

## 2017-08-02 DIAGNOSIS — Z885 Allergy status to narcotic agent status: Secondary | ICD-10-CM

## 2017-08-02 DIAGNOSIS — Z96643 Presence of artificial hip joint, bilateral: Secondary | ICD-10-CM | POA: Diagnosis present

## 2017-08-02 DIAGNOSIS — Z79891 Long term (current) use of opiate analgesic: Secondary | ICD-10-CM

## 2017-08-02 DIAGNOSIS — Z8659 Personal history of other mental and behavioral disorders: Secondary | ICD-10-CM

## 2017-08-02 DIAGNOSIS — N183 Chronic kidney disease, stage 3 (moderate): Secondary | ICD-10-CM | POA: Diagnosis present

## 2017-08-02 DIAGNOSIS — F1921 Other psychoactive substance dependence, in remission: Secondary | ICD-10-CM | POA: Diagnosis present

## 2017-08-02 DIAGNOSIS — S81812A Laceration without foreign body, left lower leg, initial encounter: Secondary | ICD-10-CM | POA: Diagnosis present

## 2017-08-02 DIAGNOSIS — S93105A Unspecified dislocation of left toe(s), initial encounter: Secondary | ICD-10-CM

## 2017-08-02 DIAGNOSIS — Z915 Personal history of self-harm: Secondary | ICD-10-CM

## 2017-08-02 DIAGNOSIS — I10 Essential (primary) hypertension: Secondary | ICD-10-CM | POA: Diagnosis present

## 2017-08-02 DIAGNOSIS — E1140 Type 2 diabetes mellitus with diabetic neuropathy, unspecified: Secondary | ICD-10-CM | POA: Diagnosis present

## 2017-08-02 DIAGNOSIS — W19XXXA Unspecified fall, initial encounter: Secondary | ICD-10-CM | POA: Diagnosis present

## 2017-08-02 DIAGNOSIS — M169 Osteoarthritis of hip, unspecified: Secondary | ICD-10-CM | POA: Diagnosis present

## 2017-08-02 DIAGNOSIS — M160 Bilateral primary osteoarthritis of hip: Secondary | ICD-10-CM | POA: Diagnosis present

## 2017-08-02 DIAGNOSIS — R2689 Other abnormalities of gait and mobility: Secondary | ICD-10-CM | POA: Diagnosis present

## 2017-08-02 DIAGNOSIS — F319 Bipolar disorder, unspecified: Secondary | ICD-10-CM | POA: Diagnosis present

## 2017-08-02 DIAGNOSIS — Z79899 Other long term (current) drug therapy: Secondary | ICD-10-CM

## 2017-08-02 DIAGNOSIS — S8262XA Displaced fracture of lateral malleolus of left fibula, initial encounter for closed fracture: Secondary | ICD-10-CM | POA: Diagnosis present

## 2017-08-02 DIAGNOSIS — S92402A Displaced unspecified fracture of left great toe, initial encounter for closed fracture: Secondary | ICD-10-CM | POA: Diagnosis present

## 2017-08-02 DIAGNOSIS — L03116 Cellulitis of left lower limb: Secondary | ICD-10-CM | POA: Diagnosis not present

## 2017-08-02 DIAGNOSIS — S82832A Other fracture of upper and lower end of left fibula, initial encounter for closed fracture: Secondary | ICD-10-CM | POA: Diagnosis not present

## 2017-08-02 DIAGNOSIS — G20 Parkinson's disease: Secondary | ICD-10-CM | POA: Diagnosis present

## 2017-08-02 DIAGNOSIS — E1165 Type 2 diabetes mellitus with hyperglycemia: Secondary | ICD-10-CM | POA: Diagnosis present

## 2017-08-02 DIAGNOSIS — G40909 Epilepsy, unspecified, not intractable, without status epilepticus: Secondary | ICD-10-CM | POA: Diagnosis present

## 2017-08-02 DIAGNOSIS — K760 Fatty (change of) liver, not elsewhere classified: Secondary | ICD-10-CM | POA: Diagnosis present

## 2017-08-02 DIAGNOSIS — F4310 Post-traumatic stress disorder, unspecified: Secondary | ICD-10-CM | POA: Diagnosis present

## 2017-08-02 DIAGNOSIS — F250 Schizoaffective disorder, bipolar type: Secondary | ICD-10-CM | POA: Diagnosis present

## 2017-08-02 DIAGNOSIS — G319 Degenerative disease of nervous system, unspecified: Secondary | ICD-10-CM | POA: Diagnosis present

## 2017-08-02 DIAGNOSIS — E785 Hyperlipidemia, unspecified: Secondary | ICD-10-CM | POA: Diagnosis present

## 2017-08-02 DIAGNOSIS — F0390 Unspecified dementia without behavioral disturbance: Secondary | ICD-10-CM | POA: Diagnosis present

## 2017-08-02 DIAGNOSIS — Z794 Long term (current) use of insulin: Secondary | ICD-10-CM

## 2017-08-02 DIAGNOSIS — I129 Hypertensive chronic kidney disease with stage 1 through stage 4 chronic kidney disease, or unspecified chronic kidney disease: Secondary | ICD-10-CM | POA: Diagnosis present

## 2017-08-02 DIAGNOSIS — N179 Acute kidney failure, unspecified: Secondary | ICD-10-CM | POA: Diagnosis present

## 2017-08-02 DIAGNOSIS — F419 Anxiety disorder, unspecified: Secondary | ICD-10-CM | POA: Diagnosis present

## 2017-08-02 DIAGNOSIS — N189 Chronic kidney disease, unspecified: Secondary | ICD-10-CM | POA: Diagnosis present

## 2017-08-02 DIAGNOSIS — E1122 Type 2 diabetes mellitus with diabetic chronic kidney disease: Secondary | ICD-10-CM | POA: Diagnosis present

## 2017-08-02 DIAGNOSIS — S91114A Laceration without foreign body of right lesser toe(s) without damage to nail, initial encounter: Secondary | ICD-10-CM | POA: Diagnosis present

## 2017-08-02 DIAGNOSIS — D696 Thrombocytopenia, unspecified: Secondary | ICD-10-CM | POA: Diagnosis present

## 2017-08-02 HISTORY — DX: Unspecified dementia, unspecified severity, without behavioral disturbance, psychotic disturbance, mood disturbance, and anxiety: F03.90

## 2017-08-02 LAB — URINALYSIS, ROUTINE W REFLEX MICROSCOPIC
BILIRUBIN URINE: NEGATIVE
Glucose, UA: >=500 mg/dL — AB
KETONES UR: 20 mg/dL — AB
Leukocytes, UA: NEGATIVE
Nitrite: NEGATIVE
Protein, ur: 30 mg/dL — AB
Specific Gravity, Urine: 1.024 (ref 1.005–1.030)
pH: 6 (ref 5.0–8.0)

## 2017-08-02 LAB — COMPREHENSIVE METABOLIC PANEL
ALT: 17 U/L (ref 17–63)
ANION GAP: 8 (ref 5–15)
AST: 20 U/L (ref 15–41)
Albumin: 3.6 g/dL (ref 3.5–5.0)
Alkaline Phosphatase: 58 U/L (ref 38–126)
BUN: 22 mg/dL — AB (ref 6–20)
CHLORIDE: 103 mmol/L (ref 101–111)
CO2: 25 mmol/L (ref 22–32)
Calcium: 9.5 mg/dL (ref 8.9–10.3)
Creatinine, Ser: 2.23 mg/dL — ABNORMAL HIGH (ref 0.61–1.24)
GFR calc Af Amer: 38 mL/min — ABNORMAL LOW (ref >60)
GFR, EST NON AFRICAN AMERICAN: 33 mL/min — AB (ref >60)
Glucose, Bld: 253 mg/dL — ABNORMAL HIGH (ref 65–99)
POTASSIUM: 6.1 mmol/L — AB (ref 3.5–5.1)
Sodium: 136 mmol/L (ref 135–145)
TOTAL PROTEIN: 6.8 g/dL (ref 6.5–8.1)
Total Bilirubin: 0.9 mg/dL (ref 0.3–1.2)

## 2017-08-02 LAB — CBC WITH DIFFERENTIAL/PLATELET
BASOS ABS: 0 10*3/uL (ref 0.0–0.1)
Basophils Relative: 0 %
Eosinophils Absolute: 0 10*3/uL (ref 0.0–0.7)
Eosinophils Relative: 0 %
HEMATOCRIT: 37.6 % — AB (ref 39.0–52.0)
Hemoglobin: 13.5 g/dL (ref 13.0–17.0)
LYMPHS ABS: 1.1 10*3/uL (ref 0.7–4.0)
Lymphocytes Relative: 15 %
MCH: 31 pg (ref 26.0–34.0)
MCHC: 35.9 g/dL (ref 30.0–36.0)
MCV: 86.4 fL (ref 78.0–100.0)
MONO ABS: 1.1 10*3/uL — AB (ref 0.1–1.0)
Monocytes Relative: 15 %
NEUTROS ABS: 5.4 10*3/uL (ref 1.7–7.7)
Neutrophils Relative %: 70 %
PLATELETS: 99 10*3/uL — AB (ref 150–400)
RBC: 4.35 MIL/uL (ref 4.22–5.81)
RDW: 12.6 % (ref 11.5–15.5)
WBC: 7.6 10*3/uL (ref 4.0–10.5)

## 2017-08-02 LAB — CBC
HEMATOCRIT: 37.4 % — AB (ref 39.0–52.0)
Hemoglobin: 12.9 g/dL — ABNORMAL LOW (ref 13.0–17.0)
MCH: 29.9 pg (ref 26.0–34.0)
MCHC: 34.5 g/dL (ref 30.0–36.0)
MCV: 86.6 fL (ref 78.0–100.0)
Platelets: 87 10*3/uL — ABNORMAL LOW (ref 150–400)
RBC: 4.32 MIL/uL (ref 4.22–5.81)
RDW: 12.7 % (ref 11.5–15.5)
WBC: 7.6 10*3/uL (ref 4.0–10.5)

## 2017-08-02 LAB — BASIC METABOLIC PANEL
Anion gap: 10 (ref 5–15)
BUN: 20 mg/dL (ref 6–20)
CALCIUM: 9.3 mg/dL (ref 8.9–10.3)
CHLORIDE: 101 mmol/L (ref 101–111)
CO2: 25 mmol/L (ref 22–32)
CREATININE: 2.09 mg/dL — AB (ref 0.61–1.24)
GFR, EST AFRICAN AMERICAN: 41 mL/min — AB (ref >60)
GFR, EST NON AFRICAN AMERICAN: 36 mL/min — AB (ref >60)
Glucose, Bld: 226 mg/dL — ABNORMAL HIGH (ref 65–99)
Potassium: 5 mmol/L (ref 3.5–5.1)
SODIUM: 136 mmol/L (ref 135–145)

## 2017-08-02 LAB — ACETAMINOPHEN LEVEL: Acetaminophen (Tylenol), Serum: <10 ug/mL — ABNORMAL LOW (ref 10–30)

## 2017-08-02 LAB — RAPID URINE DRUG SCREEN, HOSP PERFORMED
AMPHETAMINES: NOT DETECTED
BENZODIAZEPINES: NOT DETECTED
Barbiturates: NOT DETECTED
COCAINE: NOT DETECTED
OPIATES: NOT DETECTED
TETRAHYDROCANNABINOL: NOT DETECTED

## 2017-08-02 LAB — SALICYLATE LEVEL: Salicylate Lvl: <7 mg/dL (ref 2.8–30.0)

## 2017-08-02 LAB — CBG MONITORING, ED: Glucose-Capillary: 224 mg/dL — ABNORMAL HIGH (ref 65–99)

## 2017-08-02 LAB — ETHANOL: Alcohol, Ethyl (B): <10 mg/dL (ref <10)

## 2017-08-02 MED ORDER — ATORVASTATIN CALCIUM 10 MG PO TABS
10.0000 mg | ORAL_TABLET | Freq: Every day | ORAL | Status: DC
  Administered 2017-08-02 – 2017-08-11 (×10): 10 mg via ORAL
  Filled 2017-08-02 (×10): qty 1

## 2017-08-02 MED ORDER — AMOXICILLIN-POT CLAVULANATE 500-125 MG PO TABS
1.0000 | ORAL_TABLET | Freq: Three times a day (TID) | ORAL | Status: DC
  Administered 2017-08-02 – 2017-08-11 (×28): 500 mg via ORAL
  Filled 2017-08-02 (×28): qty 1

## 2017-08-02 MED ORDER — TRAZODONE HCL 50 MG PO TABS
150.0000 mg | ORAL_TABLET | Freq: Every day | ORAL | Status: DC
  Administered 2017-08-02 – 2017-08-10 (×9): 150 mg via ORAL
  Filled 2017-08-02 (×9): qty 1

## 2017-08-02 MED ORDER — SODIUM POLYSTYRENE SULFONATE 15 GM/60ML PO SUSP
15.0000 g | Freq: Once | ORAL | Status: AC
  Administered 2017-08-02: 15 g via ORAL
  Filled 2017-08-02: qty 60

## 2017-08-02 MED ORDER — ENOXAPARIN SODIUM 40 MG/0.4ML ~~LOC~~ SOLN
40.0000 mg | SUBCUTANEOUS | Status: DC
  Administered 2017-08-02 – 2017-08-11 (×10): 40 mg via SUBCUTANEOUS
  Filled 2017-08-02 (×10): qty 0.4

## 2017-08-02 MED ORDER — ACETAMINOPHEN 650 MG RE SUPP: 650.0000 mg | Freq: Four times a day (QID) | RECTAL | Status: DC | PRN

## 2017-08-02 MED ORDER — ONDANSETRON HCL 4 MG PO TABS: 4.0000 mg | ORAL_TABLET | Freq: Four times a day (QID) | ORAL | Status: DC | PRN

## 2017-08-02 MED ORDER — INSULIN ASPART 100 UNIT/ML ~~LOC~~ SOLN: 10.0000 [IU] | Freq: Once | SUBCUTANEOUS | Status: DC

## 2017-08-02 MED ORDER — PIPERACILLIN-TAZOBACTAM 3.375 G IVPB 30 MIN
3.3750 g | Freq: Once | INTRAVENOUS | Status: AC
  Administered 2017-08-02: 3.375 g via INTRAVENOUS
  Filled 2017-08-02: qty 50

## 2017-08-02 MED ORDER — QUETIAPINE FUMARATE 400 MG PO TABS
400.0000 mg | ORAL_TABLET | Freq: Every day | ORAL | Status: DC
Start: 2017-08-02 — End: 2017-08-12
  Administered 2017-08-02 – 2017-08-10 (×9): 400 mg via ORAL
  Filled 2017-08-02 (×9): qty 1

## 2017-08-02 MED ORDER — VANCOMYCIN HCL IN DEXTROSE 1-5 GM/200ML-% IV SOLN
1000.0000 mg | Freq: Once | INTRAVENOUS | Status: AC
  Administered 2017-08-02: 1000 mg via INTRAVENOUS
  Filled 2017-08-02: qty 200

## 2017-08-02 MED ORDER — ONDANSETRON HCL 4 MG/2ML IJ SOLN: 4.0000 mg | Freq: Four times a day (QID) | INTRAMUSCULAR | Status: DC | PRN

## 2017-08-02 MED ORDER — DEXTROSE 50 % IV SOLN
25.0000 g | Freq: Once | INTRAVENOUS | Status: DC
  Filled 2017-08-02 (×2): qty 50

## 2017-08-02 MED ORDER — VANCOMYCIN HCL IN DEXTROSE 750-5 MG/150ML-% IV SOLN
750.0000 mg | Freq: Two times a day (BID) | INTRAVENOUS | Status: DC
  Administered 2017-08-02 – 2017-08-05 (×7): 750 mg via INTRAVENOUS
  Filled 2017-08-02 (×8): qty 150

## 2017-08-02 MED ORDER — FUROSEMIDE 10 MG/ML IJ SOLN: 40.0000 mg | Freq: Once | INTRAMUSCULAR | Status: DC

## 2017-08-02 MED ORDER — ACETAMINOPHEN 325 MG PO TABS
650.0000 mg | ORAL_TABLET | Freq: Four times a day (QID) | ORAL | Status: DC | PRN
  Administered 2017-08-02 – 2017-08-04 (×3): 650 mg via ORAL
  Filled 2017-08-02 (×4): qty 2

## 2017-08-02 MED ORDER — TETANUS-DIPHTH-ACELL PERTUSSIS 5-2.5-18.5 LF-MCG/0.5 IM SUSP
0.5000 mL | Freq: Once | INTRAMUSCULAR | Status: DC
  Filled 2017-08-02: qty 0.5

## 2017-08-02 MED ORDER — SODIUM CHLORIDE 0.9% FLUSH
3.0000 mL | Freq: Two times a day (BID) | INTRAVENOUS | Status: DC
  Administered 2017-08-02 – 2017-08-11 (×14): 3 mL via INTRAVENOUS

## 2017-08-02 MED ORDER — INSULIN GLARGINE 100 UNIT/ML ~~LOC~~ SOLN
25.0000 [IU] | Freq: Every day | SUBCUTANEOUS | Status: DC
Start: 2017-08-02 — End: 2017-08-11
  Administered 2017-08-02 – 2017-08-10 (×9): 25 [IU] via SUBCUTANEOUS
  Filled 2017-08-02 (×9): qty 0.25

## 2017-08-02 MED ORDER — SODIUM CHLORIDE 0.9 % IV BOLUS: 500.0000 mL | Freq: Once | INTRAVENOUS | Status: DC

## 2017-08-02 MED ORDER — IBUPROFEN 100 MG/5ML PO SUSP
400.0000 mg | Freq: Once | ORAL | Status: DC
  Filled 2017-08-02: qty 20

## 2017-08-02 MED ORDER — SODIUM CHLORIDE 0.9 % IV SOLN
2.0000 g | Freq: Once | INTRAVENOUS | Status: DC
  Filled 2017-08-02: qty 20

## 2017-08-02 MED ORDER — BUPROPION HCL ER (XL) 150 MG PO TB24
300.0000 mg | ORAL_TABLET | Freq: Every day | ORAL | Status: DC
  Administered 2017-08-02 – 2017-08-11 (×10): 300 mg via ORAL
  Filled 2017-08-02 (×10): qty 2

## 2017-08-02 MED ORDER — DIVALPROEX SODIUM 500 MG PO DR TAB
1500.0000 mg | DELAYED_RELEASE_TABLET | Freq: Every day | ORAL | Status: DC
  Administered 2017-08-02 – 2017-08-10 (×9): 1500 mg via ORAL
  Filled 2017-08-02 (×9): qty 3

## 2017-08-02 MED ORDER — SODIUM CHLORIDE 0.9 % IV BOLUS
1000.0000 mL | Freq: Once | INTRAVENOUS | Status: AC
  Administered 2017-08-02: 1000 mL via INTRAVENOUS

## 2017-08-02 NOTE — Progress Notes (Signed)
@IPLOG @        PROGRESS NOTE                                                                                                                                                                                                             Patient Demographics:    [name], is a 52 y.o. male, DOB - 1966-01-06, XVQ:[id number]  Admit date - 08/02/2017   Admitting Physician Elwin Mocha, MD  Outpatient Primary MD for the patient is [name], MD  LOS - 0  Chief Complaint  Patient presents with  . Foot Injury  . Medical Clearance  . Fatigue       Brief Narrative   [name] is a 52 y.o. male past medical history significant for CKD, suicide attempt, hypertension who presents to the emergency room with lethargy.  No reason for lethargy given.  Patient denies.  Sister says patient's memory is faulty.  Both agree that patient likely does fail as he usually does.  Patient has high history of falls due to peripheral neuropathy and deconditioning.  Commonly falls or hurts himself.  Recently fell hit his head and got a black eye.  Denies any SI or HI.  He was found to have left distal fibula fracture involving left ankle joint, bilateral toe fractures along with some lacerations in his left foot with surrounding cellulitis and he was admitted.    Subjective:    Tonita Cong today has, No headache, No chest pain, No abdominal pain - No Nausea, No new weakness tingling or numbness, No Cough - SOB.    Assessment  & Plan :     1.  Multiple falls at home, chronic generalized weakness and poor balance, under the care of Dr. Carles Collet neurologist with a diagnosis of peripheral neuropathy, memory loss due to his underlying psychiatric illness with ongoing neurological workup.  At this time patient will have PT evaluation and most likely will require SNF, continue supportive care and outpatient neurology follow-up.  He has no confirmed diagnosis of dementia or Parkinson's yet.  2.  Mechanical fall  related left distal fibula fracture with involvement of ankle joint, bilateral toe fractures, left lower extremity laceration and cellulitis.  Consulted orthopedics Dr. Sharol Given who will evaluate the patient, continue empiric antibiotic treatment for cellulitis with vancomycin and Augmentin.  Patient says last tetanus booster shot less than 2 years ago.  3.  Bipolar disorder.  Continue bupropion, trazodone, Seroquel currently not suicidal homicidal.  Supportive care.  4.  Dyslipidemia.  On statin.  5.  History of seizures.  Stable no acute issues continue Depakote.  6.  DM type II.  On Lantus and sliding scale.  Monitor CBGs.  CBG (last 3)  Recent Labs    08/02/17 0415  GLUCAP 224*      Diet : Diet heart healthy/carb modified Room service appropriate? Yes; Fluid consistency: Thin    Family Communication  :  None present  Code Status :  Full  Disposition Plan  :  SNF  Consults  :  Dr Sharol Given  Procedures  :      DVT Prophylaxis  :  Lovenox    Lab Results  Component Value Date   PLT 87 (L) 08/02/2017    Inpatient Medications  Scheduled Meds: . amoxicillin-clavulanate  1 tablet Oral TID  . atorvastatin  10 mg Oral Daily  . buPROPion  300 mg Oral Daily  . dextrose  25 g Intravenous Once  . divalproex  1,500 mg Oral QHS  . enoxaparin (LOVENOX) injection  40 mg Subcutaneous Q24H  . insulin aspart  10 Units Intravenous Once  . insulin glargine  25 Units Subcutaneous Q2200  . QUEtiapine  400 mg Oral QHS  . sodium chloride flush  3 mL Intravenous Q12H  . Tdap  0.5 mL Intramuscular Once  . traZODone  150 mg Oral QHS   Continuous Infusions: . calcium gluconate    . sodium chloride    . vancomycin     PRN Meds:.acetaminophen **OR** [DISCONTINUED] acetaminophen, [DISCONTINUED] ondansetron **OR** ondansetron (ZOFRAN) IV  Antibiotics  :    Anti-infectives (From admission, onward)   Start     Dose/Rate Route Frequency Ordered Stop   08/02/17 1800  vancomycin (VANCOCIN) IVPB  750 mg/150 ml premix     750 mg 150 mL/hr over 60 Minutes Intravenous Every 12 hours 08/02/17 0601     08/02/17 1045  amoxicillin-clavulanate (AUGMENTIN) 500-125 MG per tablet 500 mg    Note to Pharmacy:  Pharmacy can adjust for renal function, being used for cellulitis   1 tablet Oral 3 times daily 08/02/17 1039     08/02/17 0445  vancomycin (VANCOCIN) IVPB 1000 mg/200 mL premix     1,000 mg 200 mL/hr over 60 Minutes Intravenous  Once 08/02/17 0433 08/02/17 0550   08/02/17 0430  piperacillin-tazobactam (ZOSYN) IVPB 3.375 g     3.375 g 100 mL/hr over 30 Minutes Intravenous  Once 08/02/17 0420 08/02/17 0520         Objective:   Vitals:   08/02/17 0628 08/02/17 0952 08/02/17 0956 08/02/17 1001  BP: (!) 125/93 (!) 127/101 101/69   Pulse: 94 92 (!) 112 (!) 117  Resp:      Temp: 98 F (36.7 C) 98.3 F (36.8 C)    TempSrc: Oral Oral    SpO2: 99% 99% (!) 88% 98%    Wt Readings from Last 3 Encounters:  05/27/17 93.4 kg (206 lb)  03/19/17 91.6 kg (202 lb)  03/18/17 92.8 kg (204 lb 9.6 oz)     Intake/Output Summary (Last 24 hours) at 08/02/2017 1050 Last data filed at 08/02/2017 0550 Gross per 24 hour  Intake 1250 ml  Output -  Net 1250 ml     Physical Exam  Awake Alert, Oriented X 3, No new F.N deficits, Normal affect [place].AT,PERRAL Supple Neck,No JVD, No cervical lymphadenopathy appriciated.  Symmetrical Chest wall movement, Good air movement bilaterally, CTAB RRR,No Gallops,Rubs or new Murmurs, No Parasternal Heave +ve B.Sounds, Abd Soft, No  tenderness, No organomegaly appriciated, No rebound - guarding or rigidity. No Cyanosis, Clubbing or edema, No new Rash or bruise,  L foot in brace with multiple lacerations and some cellulitis    Data Review:    CBC Recent Labs  Lab 08/02/17 0346 08/02/17 0735  WBC 7.6 7.6  HGB 13.5 12.9*  HCT 37.6* 37.4*  PLT 99* 87*  MCV 86.4 86.6  MCH 31.0 29.9  MCHC 35.9 34.5  RDW 12.6 12.7  LYMPHSABS 1.1  --   MONOABS 1.1*  --    EOSABS 0.0  --   BASOSABS 0.0  --     Chemistries  Recent Labs  Lab 08/02/17 0346 08/02/17 0735  NA 136 136  K 6.1* 5.0  CL 103 101  CO2 25 25  GLUCOSE 253* 226*  BUN 22* 20  CREATININE 2.23* 2.09*  CALCIUM 9.5 9.3  AST 20  --   ALT 17  --   ALKPHOS 58  --   BILITOT 0.9  --    ------------------------------------------------------------------------------------------------------------------ No results for input(s): CHOL, HDL, LDLCALC, TRIG, CHOLHDL, LDLDIRECT in the last 72 hours.  Lab Results  Component Value Date   HGBA1C 8.2 05/27/2017   ------------------------------------------------------------------------------------------------------------------ No results for input(s): TSH, T4TOTAL, T3FREE, THYROIDAB in the last 72 hours.  Invalid input(s): FREET3 ------------------------------------------------------------------------------------------------------------------ No results for input(s): VITAMINB12, FOLATE, FERRITIN, TIBC, IRON, RETICCTPCT in the last 72 hours.  Coagulation profile No results for input(s): INR, PROTIME in the last 168 hours.  No results for input(s): DDIMER in the last 72 hours.  Cardiac Enzymes No results for input(s): CKMB, TROPONINI, MYOGLOBIN in the last 168 hours.  Invalid input(s): CK ------------------------------------------------------------------------------------------------------------------ No results found for: BNP  Micro Results No results found for this or any previous visit (from the past 240 hour(s)).  Radiology Reports Dg Tibia/fibula Left  Result Date: 08/02/2017 CLINICAL DATA:  Left leg pain after fall. EXAM: LEFT TIBIA AND FIBULA - 2 VIEW COMPARISON:  None. FINDINGS: Nondisplaced acute closed distal fibular fracture extending into the ankle joint without widening of the ankle mortise is noted. Lateral soft tissue swelling of the distal leg and ankle. Small calcaneal enthesophytes are present. No joint dislocation.  Mild degenerative medial femorotibial joint space narrowing of the knee. IMPRESSION: Acute nondisplaced distal fibular diaphyseal fracture extending into the ankle joint. Overlying soft tissue swelling is noted. Electronically Signed   By: Ashley Royalty M.D.   On: 08/02/2017 03:59   Dg Ankle Complete Left  Result Date: 08/02/2017 CLINICAL DATA:  Left ankle swelling after fall. EXAM: LEFT ANKLE COMPLETE - 3+ VIEW COMPARISON:  None. FINDINGS: There is an acute, closed, oblique intra-articular fracture of the distal fibula extending into the ankle joint. No widening of the ankle mortise. There is soft tissue swelling over the lateral aspect of the included leg and over the lateral malleolus. Small calcaneal enthesophytes are present. The subtalar and midfoot articulations are intact. IMPRESSION: Acute, nondisplaced oblique fracture of the distal fibula extending into the ankle joint. Lateral soft tissue swelling of the included distal leg and ankle. Electronically Signed   By: Ashley Royalty M.D.   On: 08/02/2017 03:49   Ct Head Wo Contrast  Result Date: 08/02/2017 CLINICAL DATA:  Lethargy. Frequent falls tonight. Neck pain. Initial exam. EXAM: CT HEAD WITHOUT CONTRAST CT CERVICAL SPINE WITHOUT CONTRAST TECHNIQUE: Multidetector CT imaging of the head and cervical spine was performed following the standard protocol without intravenous contrast. Multiplanar CT image reconstructions of the cervical spine were also generated. COMPARISON:  Head CT and brain MRI May 2018 FINDINGS: CT HEAD FINDINGS Brain: Stable by age advanced atrophy. No intracranial hemorrhage, mass effect, or midline shift. No hydrocephalus. The basilar cisterns are patent. No evidence of territorial infarct or acute ischemia. No extra-axial or intracranial fluid collection. Vascular: No hyperdense vessel or unexpected calcification. Skull: No fracture or focal lesion. Sinuses/Orbits: Paranasal sinuses and mastoid air cells are clear. The visualized  orbits are unremarkable. Other: None. CT CERVICAL SPINE FINDINGS Alignment: Normal. Skull base and vertebrae: No acute fracture. Vertebral body heights are maintained. The dens and skull base are intact. Soft tissues and spinal canal: No prevertebral fluid or swelling. No visible canal hematoma. Disc levels: Disc space narrowing and endplate spurring at I7-O6 with large peripherally calcified disc osteophyte complex. This causes narrowing of the spinal canal and bilateral foramina at this level. Lesser endplate spurring at V6-H2 and C4-C5. Upper chest: 2 right thyroid nodules both less than 15 mm, no further evaluation recommended based on size. No acute findings. Other: None. IMPRESSION: 1.  No acute intracranial abnormality. 2. Age advanced cerebral atrophy, stable from May 2018. 3. No acute fracture or subluxation of the cervical spine. 4. Degenerative change in the cervical spine with spinal canal and neural foraminal stenosis at C5-C6. Electronically Signed   By: Jeb Levering M.D.   On: 08/02/2017 03:36   Ct Cervical Spine Wo Contrast  Result Date: 08/02/2017 CLINICAL DATA:  Lethargy. Frequent falls tonight. Neck pain. Initial exam. EXAM: CT HEAD WITHOUT CONTRAST CT CERVICAL SPINE WITHOUT CONTRAST TECHNIQUE: Multidetector CT imaging of the head and cervical spine was performed following the standard protocol without intravenous contrast. Multiplanar CT image reconstructions of the cervical spine were also generated. COMPARISON:  Head CT and brain MRI May 2018 FINDINGS: CT HEAD FINDINGS Brain: Stable by age advanced atrophy. No intracranial hemorrhage, mass effect, or midline shift. No hydrocephalus. The basilar cisterns are patent. No evidence of territorial infarct or acute ischemia. No extra-axial or intracranial fluid collection. Vascular: No hyperdense vessel or unexpected calcification. Skull: No fracture or focal lesion. Sinuses/Orbits: Paranasal sinuses and mastoid air cells are clear. The  visualized orbits are unremarkable. Other: None. CT CERVICAL SPINE FINDINGS Alignment: Normal. Skull base and vertebrae: No acute fracture. Vertebral body heights are maintained. The dens and skull base are intact. Soft tissues and spinal canal: No prevertebral fluid or swelling. No visible canal hematoma. Disc levels: Disc space narrowing and endplate spurring at C9-O7 with large peripherally calcified disc osteophyte complex. This causes narrowing of the spinal canal and bilateral foramina at this level. Lesser endplate spurring at S9-G2 and C4-C5. Upper chest: 2 right thyroid nodules both less than 15 mm, no further evaluation recommended based on size. No acute findings. Other: None. IMPRESSION: 1.  No acute intracranial abnormality. 2. Age advanced cerebral atrophy, stable from May 2018. 3. No acute fracture or subluxation of the cervical spine. 4. Degenerative change in the cervical spine with spinal canal and neural foraminal stenosis at C5-C6. Electronically Signed   By: Jeb Levering M.D.   On: 08/02/2017 03:36   Dg Foot Complete Left  Result Date: 08/02/2017 CLINICAL DATA:  Post reduction left foot. EXAM: LEFT FOOT - COMPLETE 3+ VIEW COMPARISON:  Pre reduction radiographs earlier this day. FINDINGS: Great toe dislocation has been reduced and is now anatomic. Small osseous fragments about the dorsal medial metatarsal phalangeal joint may reflect fractured sesamoid. Distal fibular fracture again seen. Dorsal soft tissue edema. IMPRESSION: Normal alignment of the first ray post  great toe reduction. Small fracture fragments about the plantar medial first metatarsal phalangeal joint may represent fractured tibial sesamoid. Electronically Signed   By: Jeb Levering M.D.   On: 08/02/2017 04:59   Dg Foot Complete Left  Result Date: 08/02/2017 CLINICAL DATA:  Abrasion of the right little toe soft tissue swelling of the left ankle and foot. EXAM: LEFT FOOT - COMPLETE 3+ VIEW COMPARISON:  None.  FINDINGS: Dorsal and lateral dislocation of the great toe at the first MTP joint. No acute fracture of the great toe. Acute closed oblique fracture of the distal fibula extending into the ankle joint better visualized on the ankle radiographs. There soft tissue swelling over the lateral malleolus. Calcaneal enthesophytes. Lisfranc articulation appears congruent. IMPRESSION: 1. Dorsal and lateral displacement of the first proximal phalanx relative to the head of the first metatarsal at the first MTP articulation. No acute fracture identified. Soft tissue swelling of the great toe and forefoot. 2. Acute closed oblique fracture of the distal fibula with extension into the ankle joint. 3. Calcaneal enthesopathy. Electronically Signed   By: Ashley Royalty M.D.   On: 08/02/2017 03:52   Dg Foot Complete Right  Result Date: 08/02/2017 CLINICAL DATA:  Abrasion of the right little toe EXAM: RIGHT FOOT COMPLETE - 3+ VIEW COMPARISON:  None. FINDINGS: Acute oblique fracture of the fifth proximal phalangeal neck without intra-articular extension. Soft tissue swelling is noted of the little toe. Calcaneal enthesopathy is seen along the plantar aspect. Probable bone island of the body of the calcaneus. Haglund deformity with prominent dorsal bony protuberance of the calcaneus. IMPRESSION: Acute oblique fracture of the right fifth proximal phalangeal neck without intra-articular involvement. Electronically Signed   By: Ashley Royalty M.D.   On: 08/02/2017 03:57    Time Spent in minutes  30   Lala Lund M.D on 08/02/2017 at 10:50 AM  Between 7am to 7pm - Pager - [phone] ( page via [email or web address], text pages only, please mention full 10 digit call back number). After 7pm go to [email or web address] - password [facility]

## 2017-08-02 NOTE — Progress Notes (Signed)
Orthopedic Tech Progress Note Patient Details:  [name] 05/27/1965 [id number]  Ortho Devices Type of Ortho Device: CAM walker Ortho Device/Splint Location: lle Ortho Device/Splint Interventions: Application   Post Interventions Patient Tolerated: Well Instructions Provided: Care of device   [name] 08/02/2017, 2:13 PM

## 2017-08-02 NOTE — Progress Notes (Signed)
Pharmacy Antibiotic Note  [name] is a 52 y.o. male admitted on 08/02/2017 with cellulitis.  Pharmacy has been consulted for Vancomycin dosing. WBC WNL. Noted renal dysfunction.   Plan: Vancomycin 750 mg IV q12h Trend WBC, temp, renal function  F/U infectious work-up Drug levels ASAP with renal dysfunction   Temp (24hrs), Avg:98.7 F (37.1 C), Min:98.7 F (37.1 C), Max:98.7 F (37.1 C)  Recent Labs  Lab 08/02/17 0346  WBC 7.6  CREATININE 2.23*    CrCl cannot be calculated (Unknown ideal weight.).    Allergies  Allergen Reactions  . Vicodin [Hydrocodone-Acetaminophen] Itching   Narda Bonds 08/02/2017 5:57 AM

## 2017-08-02 NOTE — H&P (Signed)
[facility] History and Physical  Jonathan PLEMMONS FFM:[id number] DOB: 1966/01/15 DOA: 08/02/2017  Referring physician:  PCP: [name], MD   Chief Complaint: "I fell."  HPI: [name] is a 52 y.o. male past medical history significant for CKD, suicide attempt, hypertension who presents to the emergency room with lethargy.  No reason for lethargy given.  Patient denies.  Sister says patient's memory is faulty.  Both agree that patient likely does fail as he usually does.  Patient has high history of falls due to his Parkinson's.  Commonly falls or hurts himself.  Recently fell hit his head and got a black eye.  Denies any SI or HI.  Course: Imaging shows multiple fractures to the lower extremities.  Head and neck cleared by EDP.  Hospitalist consulted for admission.   Review of Systems:  As per HPI otherwise 10 point review of systems negative.    Past Medical History:  Diagnosis Date  . ADD (attention deficit disorder)   . Anxiety   . Arthritis    right hip  . Bipolar 1 disorder ([name])   . Blood in urine   . CKD (chronic kidney disease), stage III ([name])   . Creatinine elevation   . Depression    bipolar [facility]  . Diabetes mellitus without complication ([place])   . Family history of anesthesia complication    pt is unsure , but pt father may have been difficult to arouse   . HCAP (healthcare-associated pneumonia) 10/31/2012  . History of kidney stones   . Hypertension   . Hypogonadism male   . Liver fatty degeneration   . Microscopic hematuria    hereditary s/p Urology eval  . Osteoarthritis of right hip 11/28/2011   2012 2015 s/p THR Severe  Dr Novella Olive    . Pleural effusion 11/02/2012  . Pneumonia 10-2012  . Pneumonia, organism unspecified(486) 11/02/2012  . Polysubstance dependence, non-opioid, in remission ([name])    remote  . Primary osteoarthritis of left hip 05/22/2015  . PTSD (post-traumatic stress disorder)    SOCIAL ANXIETY DISORDER   . Suicide  attempt by multiple drug overdose ([name]) Jan 06, 2016   Grieving his cat's death 26-Jun-2015   Past Surgical History:  Procedure Laterality Date  . BACK SURGERY    . CLOSED REDUCTION METACARPAL WITH PERCUTANEOUS PINNING Right   . LUMBAR DISC SURGERY    . TONSILLECTOMY    . TOTAL HIP ARTHROPLASTY Right 08/16/2013   Procedure: TOTAL HIP ARTHROPLASTY ANTERIOR APPROACH;  Surgeon: Hessie Dibble, MD;  Location: [place];  Service: Orthopedics;  Laterality: Right;  . TOTAL HIP ARTHROPLASTY Left 05/22/2015   Procedure: TOTAL HIP ARTHROPLASTY ANTERIOR APPROACH;  Surgeon: Melrose Nakayama, MD;  Location: [place];  Service: Orthopedics;  Laterality: Left;   Social History:  reports that he quit smoking about 3 years ago. His smoking use included cigarettes. He quit after 0.00 years of use. He has never used smokeless tobacco. He reports that he does not drink alcohol or use drugs.  Allergies  Allergen Reactions  . Vicodin [Hydrocodone-Acetaminophen] Itching    Family History  Problem Relation Age of Onset  . Diabetes Father   . Cancer Mother        died of melanoma with mets  . Cervical cancer Sister   . Diabetes Sister   . Other Neg Hx        hypogonadism     Prior to Admission medications   Medication Sig Start Date End Date Taking?  Authorizing Provider  atorvastatin (LIPITOR) 10 MG tablet Take 1 tablet (10 mg total) by mouth daily. 02/04/17  Yes [name], MD  Blood Glucose Monitoring Suppl (ACCU-CHEK AVIVA) device Use as instructed daily. 05/27/17  Yes [name], MD  Blood Glucose Monitoring Suppl (ACCU-CHEK GUIDE) w/Device KIT 1 Device by Does not apply route daily. Use to check BS twice a day 06/25/16  Yes Plotnikov, Evie Lacks, MD  buPROPion (WELLBUTRIN XL) 300 MG 24 hr tablet Take 1 tablet (300 mg total) by mouth daily. 10/13/16  Yes Elgergawy, Silver Huguenin, MD  cyanocobalamin 500 MCG tablet Take 1 tablet (500 mcg total) by mouth daily. 10/13/16  Yes Elgergawy, Silver Huguenin, MD  diclofenac sodium  (VOLTAREN) 1 % GEL Apply 4 g topically 4 (four) times daily. Patient taking differently: Apply 4 g topically 4 (four) times daily as needed (pain).  02/04/17  Yes [name], MD  divalproex (DEPAKOTE) 500 MG DR tablet Take 3 tablets (1,500 mg total) by mouth at bedtime. 10/13/16 10/13/17 Yes Elgergawy, Silver Huguenin, MD  glucose blood (ACCU-CHEK AVIVA) test strip Use as instructed 3 x daily 05/27/17  Yes Newlin, Enobong, MD  glucose blood (ACCU-CHEK GUIDE) test strip 1 each by Other route 2 (two) times daily. Use to check blood sugars twice a day 06/25/16  Yes Plotnikov, Evie Lacks, MD  Insulin Glargine (LANTUS) 100 UNIT/ML Solostar Pen Inject 32 Units into the skin daily at 10 pm. Patient taking differently: Inject 25 Units into the skin daily at 10 pm.  05/27/17  Yes Newlin, Enobong, MD  Insulin Pen Needle (TRUEPLUS PEN NEEDLES) 31G X 5 MM MISC USE AS DIRECTED 02/12/17  Yes Newlin, Enobong, MD  Lancets Misc. (ACCU-CHEK SOFTCLIX LANCET DEV) KIT 1 each by Does not apply route 3 (three) times daily with meals. 05/27/17  Yes [name], MD  methocarbamol (ROBAXIN) 500 MG tablet Take 2 tablets (1,000 mg total) by mouth 2 (two) times daily. 05/27/17  Yes [name], MD  QUEtiapine (SEROQUEL) 400 MG tablet Take 1 tablet (400 mg total) by mouth at bedtime. 10/13/16  Yes Elgergawy, Silver Huguenin, MD  tadalafil (CIALIS) 5 MG tablet TAKE 1 TABLET (5 MG TOTAL) BY MOUTH DAILY Patient taking differently: Take 5 mg by mouth daily as needed for erectile dysfunction.  05/27/17  Yes Newlin, Charlane Ferretti, MD  traMADol (ULTRAM) 50 MG tablet TAKE 1 TABLET BY MOUTH AT BEDTIME. 05/21/17  Yes Newlin, Enobong, MD  traZODone (DESYREL) 150 MG tablet Take 1 tablet (150 mg total) by mouth at bedtime. 10/13/16  Yes Elgergawy, Silver Huguenin, MD  pregabalin (LYRICA) 75 MG capsule Take 1 capsule (75 mg total) by mouth 2 (two) times daily. Patient not taking: Reported on 06/23/2017 05/27/17   [name], MD  Testosterone (ANDROGEL) 20.25 MG/1.25GM  (1.62%) GEL Apply 20.25 mg topically every morning. Apply topically under arm pit alternating with each application. Patient not taking: Reported on 06/23/2017 05/27/17   [name], MD   Physical Exam: Vitals:   08/02/17 0400 08/02/17 0415 08/02/17 0445 08/02/17 0500  BP: 108/80 (!) 104/91 (!) 111/92   Pulse: 91 87  91  Resp:    12  Temp:      TempSrc:      SpO2: 97% 99%  98%    Wt Readings from Last 3 Encounters:  05/27/17 93.4 kg (206 lb)  03/19/17 91.6 kg (202 lb)  03/18/17 92.8 kg (204 lb 9.6 oz)    General:  Appears calm and comfortable; A&Ox3 Eyes:  PERRL,  EOMI, normal lids, iris ENT:  grossly normal hearing, lips & tongue Neck:  no LAD, masses or thyromegaly Cardiovascular:  RRR, no m/r/g. No LE edema.  Respiratory:  CTA bilaterally, no w/r/r. Normal respiratory effort. Abdomen:  soft, ntnd Skin:  no rash or induration seen on limited exam, laceration to base of fifth toe; left foot swelling on the dorsum; both feet show erythema on the dorsum Musculoskeletal:  grossly normal tone BUE/BLE Psychiatric:  grossly normal mood and affect, speech fluent and appropriate Neurologic:  CN 2-12 grossly intact, moves all extremities in coordinated fashion.          Labs on Admission:  Basic Metabolic Panel: Recent Labs  Lab 08/02/17 0346  NA 136  K 6.1*  CL 103  CO2 25  GLUCOSE 253*  BUN 22*  CREATININE 2.23*  CALCIUM 9.5   Liver Function Tests: Recent Labs  Lab 08/02/17 0346  AST 20  ALT 17  ALKPHOS 58  BILITOT 0.9  PROT 6.8  ALBUMIN 3.6   No results for input(s): LIPASE, AMYLASE in the last 168 hours. No results for input(s): AMMONIA in the last 168 hours. CBC: Recent Labs  Lab 08/02/17 0346  WBC 7.6  NEUTROABS 5.4  HGB 13.5  HCT 37.6*  MCV 86.4  PLT 99*   Cardiac Enzymes: No results for input(s): CKTOTAL, CKMB, CKMBINDEX, TROPONINI in the last 168 hours.  BNP (last 3 results) No results for input(s): BNP in the last 8760 hours.  ProBNP  (last 3 results) No results for input(s): PROBNP in the last 8760 hours.   Creatinine clearance cannot be calculated (Unknown ideal weight.)  CBG: Recent Labs  Lab 08/02/17 0415  GLUCAP 224*    Radiological Exams on Admission: Dg Tibia/fibula Left  Result Date: 08/02/2017 CLINICAL DATA:  Left leg pain after fall. EXAM: LEFT TIBIA AND FIBULA - 2 VIEW COMPARISON:  None. FINDINGS: Nondisplaced acute closed distal fibular fracture extending into the ankle joint without widening of the ankle mortise is noted. Lateral soft tissue swelling of the distal leg and ankle. Small calcaneal enthesophytes are present. No joint dislocation. Mild degenerative medial femorotibial joint space narrowing of the knee. IMPRESSION: Acute nondisplaced distal fibular diaphyseal fracture extending into the ankle joint. Overlying soft tissue swelling is noted. Electronically Signed   By: Ashley Royalty M.D.   On: 08/02/2017 03:59   Dg Ankle Complete Left  Result Date: 08/02/2017 CLINICAL DATA:  Left ankle swelling after fall. EXAM: LEFT ANKLE COMPLETE - 3+ VIEW COMPARISON:  None. FINDINGS: There is an acute, closed, oblique intra-articular fracture of the distal fibula extending into the ankle joint. No widening of the ankle mortise. There is soft tissue swelling over the lateral aspect of the included leg and over the lateral malleolus. Small calcaneal enthesophytes are present. The subtalar and midfoot articulations are intact. IMPRESSION: Acute, nondisplaced oblique fracture of the distal fibula extending into the ankle joint. Lateral soft tissue swelling of the included distal leg and ankle. Electronically Signed   By: Ashley Royalty M.D.   On: 08/02/2017 03:49   Ct Head Wo Contrast  Result Date: 08/02/2017 CLINICAL DATA:  Lethargy. Frequent falls tonight. Neck pain. Initial exam. EXAM: CT HEAD WITHOUT CONTRAST CT CERVICAL SPINE WITHOUT CONTRAST TECHNIQUE: Multidetector CT imaging of the head and cervical spine was  performed following the standard protocol without intravenous contrast. Multiplanar CT image reconstructions of the cervical spine were also generated. COMPARISON:  Head CT and brain MRI May 2018 FINDINGS: CT HEAD FINDINGS  Brain: Stable by age advanced atrophy. No intracranial hemorrhage, mass effect, or midline shift. No hydrocephalus. The basilar cisterns are patent. No evidence of territorial infarct or acute ischemia. No extra-axial or intracranial fluid collection. Vascular: No hyperdense vessel or unexpected calcification. Skull: No fracture or focal lesion. Sinuses/Orbits: Paranasal sinuses and mastoid air cells are clear. The visualized orbits are unremarkable. Other: None. CT CERVICAL SPINE FINDINGS Alignment: Normal. Skull base and vertebrae: No acute fracture. Vertebral body heights are maintained. The dens and skull base are intact. Soft tissues and spinal canal: No prevertebral fluid or swelling. No visible canal hematoma. Disc levels: Disc space narrowing and endplate spurring at O9-B3 with large peripherally calcified disc osteophyte complex. This causes narrowing of the spinal canal and bilateral foramina at this level. Lesser endplate spurring at Z3-G9 and C4-C5. Upper chest: 2 right thyroid nodules both less than 15 mm, no further evaluation recommended based on size. No acute findings. Other: None. IMPRESSION: 1.  No acute intracranial abnormality. 2. Age advanced cerebral atrophy, stable from May 2018. 3. No acute fracture or subluxation of the cervical spine. 4. Degenerative change in the cervical spine with spinal canal and neural foraminal stenosis at C5-C6. Electronically Signed   By: Jeb Levering M.D.   On: 08/02/2017 03:36   Ct Cervical Spine Wo Contrast  Result Date: 08/02/2017 CLINICAL DATA:  Lethargy. Frequent falls tonight. Neck pain. Initial exam. EXAM: CT HEAD WITHOUT CONTRAST CT CERVICAL SPINE WITHOUT CONTRAST TECHNIQUE: Multidetector CT imaging of the head and cervical spine  was performed following the standard protocol without intravenous contrast. Multiplanar CT image reconstructions of the cervical spine were also generated. COMPARISON:  Head CT and brain MRI May 2018 FINDINGS: CT HEAD FINDINGS Brain: Stable by age advanced atrophy. No intracranial hemorrhage, mass effect, or midline shift. No hydrocephalus. The basilar cisterns are patent. No evidence of territorial infarct or acute ischemia. No extra-axial or intracranial fluid collection. Vascular: No hyperdense vessel or unexpected calcification. Skull: No fracture or focal lesion. Sinuses/Orbits: Paranasal sinuses and mastoid air cells are clear. The visualized orbits are unremarkable. Other: None. CT CERVICAL SPINE FINDINGS Alignment: Normal. Skull base and vertebrae: No acute fracture. Vertebral body heights are maintained. The dens and skull base are intact. Soft tissues and spinal canal: No prevertebral fluid or swelling. No visible canal hematoma. Disc levels: Disc space narrowing and endplate spurring at J2-E2 with large peripherally calcified disc osteophyte complex. This causes narrowing of the spinal canal and bilateral foramina at this level. Lesser endplate spurring at A8-T4 and C4-C5. Upper chest: 2 right thyroid nodules both less than 15 mm, no further evaluation recommended based on size. No acute findings. Other: None. IMPRESSION: 1.  No acute intracranial abnormality. 2. Age advanced cerebral atrophy, stable from May 2018. 3. No acute fracture or subluxation of the cervical spine. 4. Degenerative change in the cervical spine with spinal canal and neural foraminal stenosis at C5-C6. Electronically Signed   By: Jeb Levering M.D.   On: 08/02/2017 03:36   Dg Foot Complete Left  Result Date: 08/02/2017 CLINICAL DATA:  Post reduction left foot. EXAM: LEFT FOOT - COMPLETE 3+ VIEW COMPARISON:  Pre reduction radiographs earlier this day. FINDINGS: Great toe dislocation has been reduced and is now anatomic. Small  osseous fragments about the dorsal medial metatarsal phalangeal joint may reflect fractured sesamoid. Distal fibular fracture again seen. Dorsal soft tissue edema. IMPRESSION: Normal alignment of the first ray post great toe reduction. Small fracture fragments about the plantar medial first  metatarsal phalangeal joint may represent fractured tibial sesamoid. Electronically Signed   By: Jeb Levering M.D.   On: 08/02/2017 04:59   Dg Foot Complete Left  Result Date: 08/02/2017 CLINICAL DATA:  Abrasion of the right little toe soft tissue swelling of the left ankle and foot. EXAM: LEFT FOOT - COMPLETE 3+ VIEW COMPARISON:  None. FINDINGS: Dorsal and lateral dislocation of the great toe at the first MTP joint. No acute fracture of the great toe. Acute closed oblique fracture of the distal fibula extending into the ankle joint better visualized on the ankle radiographs. There soft tissue swelling over the lateral malleolus. Calcaneal enthesophytes. Lisfranc articulation appears congruent. IMPRESSION: 1. Dorsal and lateral displacement of the first proximal phalanx relative to the head of the first metatarsal at the first MTP articulation. No acute fracture identified. Soft tissue swelling of the great toe and forefoot. 2. Acute closed oblique fracture of the distal fibula with extension into the ankle joint. 3. Calcaneal enthesopathy. Electronically Signed   By: Ashley Royalty M.D.   On: 08/02/2017 03:52   Dg Foot Complete Right  Result Date: 08/02/2017 CLINICAL DATA:  Abrasion of the right little toe EXAM: RIGHT FOOT COMPLETE - 3+ VIEW COMPARISON:  None. FINDINGS: Acute oblique fracture of the fifth proximal phalangeal neck without intra-articular extension. Soft tissue swelling is noted of the little toe. Calcaneal enthesopathy is seen along the plantar aspect. Probable bone island of the body of the calcaneus. Haglund deformity with prominent dorsal bony protuberance of the calcaneus. IMPRESSION: Acute oblique  fracture of the right fifth proximal phalangeal neck without intra-articular involvement. Electronically Signed   By: Ashley Royalty M.D.   On: 08/02/2017 03:57    EKG: Independently reviewed. NSR, no stemi.  Assessment/Plan Active Problems:   Fall   Fall Chronic issue related to patient's early onset Parkinson's Patient uses a walker and cane ambulating cannot use crutches Due to his multiple injuries to his feet he may need snfplacement PT/OT eval  Hyperkalemia Bolus IVF, calcium, insulin, dextrose EDP ordered Kayexalate Will recheck potassium @ 8  Broken LLEs Aircast applied in the ED by [employer] L Reduction of toe fracture by EDP -great toe L Right fifth proximal pharyngeal neck fracture R Ortho consult placed, please call  Fifth toe laceration on the right EDP to irrigate and plan is to let it heal by secondary intention EDP to assess if pt needs Tdap Wound care consult  Cellulitis L foot May just be bruising from trauma. Vanc and Zosyn  Diabetes Continue glargine Insulin before meals  CKD Monitor Cr daily Cr at baseline,  2.0-2.2  HLD Continue statin  Depression Continue Wellbutrin Continue Depakote Cotn trazadone And Seroquel   Code Status: FC  DVT Prophylaxis: heparin Family Communication: sister POA at bedside Disposition Plan: Pending Improvement  Status: obs, medsurg  Elwin Mocha, MD Family Medicine [facility] [email or web address] Password [email or web address]

## 2017-08-02 NOTE — Consult Note (Signed)
ORTHOPAEDIC CONSULTATION  REQUESTING PHYSICIAN: Elwin Mocha, MD  Chief Complaint: Left ankle and foot pain.  HPI: [name] is a 52 y.o. male who presents with left ankle nondisplaced lateral malleolar fracture as well as injury to the medial tibial sesamoid left great toe.  Patient states that he fell twisting his ankle.  Past Medical History:  Diagnosis Date  . ADD (attention deficit disorder)   . Anxiety   . Arthritis    right hip  . Bipolar 1 disorder ([place])   . Blood in urine   . CKD (chronic kidney disease), stage III ([place])   . Creatinine elevation   . Depression    bipolar [facility]  . Diabetes mellitus without complication ([place])   . Family history of anesthesia complication    pt is unsure , but pt father may have been difficult to arouse   . HCAP (healthcare-associated pneumonia) 10/31/2012  . History of kidney stones   . Hypertension   . Hypogonadism male   . Liver fatty degeneration   . Microscopic hematuria    hereditary s/p Urology eval  . Osteoarthritis of right hip 11/28/2011   2012 2015 s/p THR Severe  Dr Novella Olive    . Pleural effusion 11/02/2012  . Pneumonia 10-2012  . Pneumonia, organism unspecified(486) 11/02/2012  . Polysubstance dependence, non-opioid, in remission ([place])    remote  . Primary osteoarthritis of left hip 05/22/2015  . PTSD (post-traumatic stress disorder)    SOCIAL ANXIETY DISORDER   . Suicide attempt by multiple drug overdose ([place]) 01/19/16   Grieving his cat's death Jul 09, 2015   Past Surgical History:  Procedure Laterality Date  . BACK SURGERY    . CLOSED REDUCTION METACARPAL WITH PERCUTANEOUS PINNING Right   . LUMBAR DISC SURGERY    . TONSILLECTOMY    . TOTAL HIP ARTHROPLASTY Right 08/16/2013   Procedure: TOTAL HIP ARTHROPLASTY ANTERIOR APPROACH;  Surgeon: Hessie Dibble, MD;  Location: [place];  Service: Orthopedics;  Laterality: Right;  . TOTAL HIP ARTHROPLASTY Left 05/22/2015   Procedure: TOTAL HIP ARTHROPLASTY  ANTERIOR APPROACH;  Surgeon: Melrose Nakayama, MD;  Location: [place];  Service: Orthopedics;  Laterality: Left;   Social History   Socioeconomic History  . Marital status: Single    Spouse name: Not on file  . Number of children: Not on file  . Years of education: Not on file  . Highest education level: Not on file  Occupational History  . Occupation: disability  Social Needs  . Financial resource strain: Not on file  . Food insecurity:    Worry: Not on file    Inability: Not on file  . Transportation needs:    Medical: Not on file    Non-medical: Not on file  Tobacco Use  . Smoking status: Former Smoker    Years: 0.00    Types: Cigarettes    Last attempt to quit: 02/19/2014    Years since quitting: 3.4  . Smokeless tobacco: Never Used  Substance and Sexual Activity  . Alcohol use: No  . Drug use: No    Comment: hx of marijuana/cocaine/crack use but sober since 2022/07/08  . Sexual activity: Not Currently  Lifestyle  . Physical activity:    Days per week: Not on file    Minutes per session: Not on file  . Stress: Not on file  Relationships  . Social connections:    Talks on phone: Not on file    Gets together: Not on  file    Attends religious service: Not on file    Active member of club or organization: Not on file    Attends meetings of clubs or organizations: Not on file    Relationship status: Not on file  Other Topics Concern  . Not on file  Social History Narrative   regualar exercise-no   Family History  Problem Relation Age of Onset  . Diabetes Father   . Cancer Mother        died of melanoma with mets  . Cervical cancer Sister   . Diabetes Sister   . Other Neg Hx        hypogonadism   - negative except otherwise stated in the family history section Allergies  Allergen Reactions  . Vicodin [Hydrocodone-Acetaminophen] Itching   Prior to Admission medications   Medication Sig Start Date End Date Taking? Authorizing Provider  atorvastatin (LIPITOR) 10 MG  tablet Take 1 tablet (10 mg total) by mouth daily. 02/04/17  Yes [name], MD  Blood Glucose Monitoring Suppl (ACCU-CHEK AVIVA) device Use as instructed daily. 05/27/17  Yes [name], MD  Blood Glucose Monitoring Suppl (ACCU-CHEK GUIDE) w/Device KIT 1 Device by Does not apply route daily. Use to check BS twice a day 06/25/16  Yes Plotnikov, Evie Lacks, MD  buPROPion (WELLBUTRIN XL) 300 MG 24 hr tablet Take 1 tablet (300 mg total) by mouth daily. 10/13/16  Yes Elgergawy, Silver Huguenin, MD  cyanocobalamin 500 MCG tablet Take 1 tablet (500 mcg total) by mouth daily. 10/13/16  Yes Elgergawy, Silver Huguenin, MD  diclofenac sodium (VOLTAREN) 1 % GEL Apply 4 g topically 4 (four) times daily. Patient taking differently: Apply 4 g topically 4 (four) times daily as needed (pain).  02/04/17  Yes [name], MD  divalproex (DEPAKOTE) 500 MG DR tablet Take 3 tablets (1,500 mg total) by mouth at bedtime. 10/13/16 10/13/17 Yes Elgergawy, Silver Huguenin, MD  glucose blood (ACCU-CHEK AVIVA) test strip Use as instructed 3 x daily 05/27/17  Yes Newlin, Enobong, MD  glucose blood (ACCU-CHEK GUIDE) test strip 1 each by Other route 2 (two) times daily. Use to check blood sugars twice a day 06/25/16  Yes Plotnikov, Evie Lacks, MD  Insulin Glargine (LANTUS) 100 UNIT/ML Solostar Pen Inject 32 Units into the skin daily at 10 pm. Patient taking differently: Inject 25 Units into the skin daily at 10 pm.  05/27/17  Yes Newlin, Enobong, MD  Insulin Pen Needle (TRUEPLUS PEN NEEDLES) 31G X 5 MM MISC USE AS DIRECTED 02/12/17  Yes Newlin, Enobong, MD  Lancets Misc. (ACCU-CHEK SOFTCLIX LANCET DEV) KIT 1 each by Does not apply route 3 (three) times daily with meals. 05/27/17  Yes [name], MD  methocarbamol (ROBAXIN) 500 MG tablet Take 2 tablets (1,000 mg total) by mouth 2 (two) times daily. 05/27/17  Yes [name], MD  QUEtiapine (SEROQUEL) 400 MG tablet Take 1 tablet (400 mg total) by mouth at bedtime. 10/13/16  Yes Elgergawy, Silver Huguenin, MD    tadalafil (CIALIS) 5 MG tablet TAKE 1 TABLET (5 MG TOTAL) BY MOUTH DAILY Patient taking differently: Take 5 mg by mouth daily as needed for erectile dysfunction.  05/27/17  Yes Newlin, Charlane Ferretti, MD  traMADol (ULTRAM) 50 MG tablet TAKE 1 TABLET BY MOUTH AT BEDTIME. 05/21/17  Yes Newlin, Enobong, MD  traZODone (DESYREL) 150 MG tablet Take 1 tablet (150 mg total) by mouth at bedtime. 10/13/16  Yes Elgergawy, Silver Huguenin, MD  pregabalin (LYRICA) 75 MG capsule Take 1  capsule (75 mg total) by mouth 2 (two) times daily. Patient not taking: Reported on 06/23/2017 05/27/17   [name], MD  Testosterone (ANDROGEL) 20.25 MG/1.25GM (1.62%) GEL Apply 20.25 mg topically every morning. Apply topically under arm pit alternating with each application. Patient not taking: Reported on 06/23/2017 05/27/17   [name], MD   Dg Tibia/fibula Left  Result Date: 08/02/2017 CLINICAL DATA:  Left leg pain after fall. EXAM: LEFT TIBIA AND FIBULA - 2 VIEW COMPARISON:  None. FINDINGS: Nondisplaced acute closed distal fibular fracture extending into the ankle joint without widening of the ankle mortise is noted. Lateral soft tissue swelling of the distal leg and ankle. Small calcaneal enthesophytes are present. No joint dislocation. Mild degenerative medial femorotibial joint space narrowing of the knee. IMPRESSION: Acute nondisplaced distal fibular diaphyseal fracture extending into the ankle joint. Overlying soft tissue swelling is noted. Electronically Signed   By: Ashley Royalty M.D.   On: 08/02/2017 03:59   Dg Ankle Complete Left  Result Date: 08/02/2017 CLINICAL DATA:  Left ankle swelling after fall. EXAM: LEFT ANKLE COMPLETE - 3+ VIEW COMPARISON:  None. FINDINGS: There is an acute, closed, oblique intra-articular fracture of the distal fibula extending into the ankle joint. No widening of the ankle mortise. There is soft tissue swelling over the lateral aspect of the included leg and over the lateral malleolus. Small calcaneal  enthesophytes are present. The subtalar and midfoot articulations are intact. IMPRESSION: Acute, nondisplaced oblique fracture of the distal fibula extending into the ankle joint. Lateral soft tissue swelling of the included distal leg and ankle. Electronically Signed   By: Ashley Royalty M.D.   On: 08/02/2017 03:49   Ct Head Wo Contrast  Result Date: 08/02/2017 CLINICAL DATA:  Lethargy. Frequent falls tonight. Neck pain. Initial exam. EXAM: CT HEAD WITHOUT CONTRAST CT CERVICAL SPINE WITHOUT CONTRAST TECHNIQUE: Multidetector CT imaging of the head and cervical spine was performed following the standard protocol without intravenous contrast. Multiplanar CT image reconstructions of the cervical spine were also generated. COMPARISON:  Head CT and brain MRI May 2018 FINDINGS: CT HEAD FINDINGS Brain: Stable by age advanced atrophy. No intracranial hemorrhage, mass effect, or midline shift. No hydrocephalus. The basilar cisterns are patent. No evidence of territorial infarct or acute ischemia. No extra-axial or intracranial fluid collection. Vascular: No hyperdense vessel or unexpected calcification. Skull: No fracture or focal lesion. Sinuses/Orbits: Paranasal sinuses and mastoid air cells are clear. The visualized orbits are unremarkable. Other: None. CT CERVICAL SPINE FINDINGS Alignment: Normal. Skull base and vertebrae: No acute fracture. Vertebral body heights are maintained. The dens and skull base are intact. Soft tissues and spinal canal: No prevertebral fluid or swelling. No visible canal hematoma. Disc levels: Disc space narrowing and endplate spurring at V9-D6 with large peripherally calcified disc osteophyte complex. This causes narrowing of the spinal canal and bilateral foramina at this level. Lesser endplate spurring at L8-V5 and C4-C5. Upper chest: 2 right thyroid nodules both less than 15 mm, no further evaluation recommended based on size. No acute findings. Other: None. IMPRESSION: 1.  No acute  intracranial abnormality. 2. Age advanced cerebral atrophy, stable from May 2018. 3. No acute fracture or subluxation of the cervical spine. 4. Degenerative change in the cervical spine with spinal canal and neural foraminal stenosis at C5-C6. Electronically Signed   By: Jeb Levering M.D.   On: 08/02/2017 03:36   Ct Cervical Spine Wo Contrast  Result Date: 08/02/2017 CLINICAL DATA:  Lethargy. Frequent falls tonight. Neck pain.  Initial exam. EXAM: CT HEAD WITHOUT CONTRAST CT CERVICAL SPINE WITHOUT CONTRAST TECHNIQUE: Multidetector CT imaging of the head and cervical spine was performed following the standard protocol without intravenous contrast. Multiplanar CT image reconstructions of the cervical spine were also generated. COMPARISON:  Head CT and brain MRI May 2018 FINDINGS: CT HEAD FINDINGS Brain: Stable by age advanced atrophy. No intracranial hemorrhage, mass effect, or midline shift. No hydrocephalus. The basilar cisterns are patent. No evidence of territorial infarct or acute ischemia. No extra-axial or intracranial fluid collection. Vascular: No hyperdense vessel or unexpected calcification. Skull: No fracture or focal lesion. Sinuses/Orbits: Paranasal sinuses and mastoid air cells are clear. The visualized orbits are unremarkable. Other: None. CT CERVICAL SPINE FINDINGS Alignment: Normal. Skull base and vertebrae: No acute fracture. Vertebral body heights are maintained. The dens and skull base are intact. Soft tissues and spinal canal: No prevertebral fluid or swelling. No visible canal hematoma. Disc levels: Disc space narrowing and endplate spurring at P1-W2 with large peripherally calcified disc osteophyte complex. This causes narrowing of the spinal canal and bilateral foramina at this level. Lesser endplate spurring at H8-N2 and C4-C5. Upper chest: 2 right thyroid nodules both less than 15 mm, no further evaluation recommended based on size. No acute findings. Other: None. IMPRESSION: 1.  No  acute intracranial abnormality. 2. Age advanced cerebral atrophy, stable from May 2018. 3. No acute fracture or subluxation of the cervical spine. 4. Degenerative change in the cervical spine with spinal canal and neural foraminal stenosis at C5-C6. Electronically Signed   By: Jeb Levering M.D.   On: 08/02/2017 03:36   Dg Foot Complete Left  Result Date: 08/02/2017 CLINICAL DATA:  Post reduction left foot. EXAM: LEFT FOOT - COMPLETE 3+ VIEW COMPARISON:  Pre reduction radiographs earlier this day. FINDINGS: Great toe dislocation has been reduced and is now anatomic. Small osseous fragments about the dorsal medial metatarsal phalangeal joint may reflect fractured sesamoid. Distal fibular fracture again seen. Dorsal soft tissue edema. IMPRESSION: Normal alignment of the first ray post great toe reduction. Small fracture fragments about the plantar medial first metatarsal phalangeal joint may represent fractured tibial sesamoid. Electronically Signed   By: Jeb Levering M.D.   On: 08/02/2017 04:59   Dg Foot Complete Left  Result Date: 08/02/2017 CLINICAL DATA:  Abrasion of the right little toe soft tissue swelling of the left ankle and foot. EXAM: LEFT FOOT - COMPLETE 3+ VIEW COMPARISON:  None. FINDINGS: Dorsal and lateral dislocation of the great toe at the first MTP joint. No acute fracture of the great toe. Acute closed oblique fracture of the distal fibula extending into the ankle joint better visualized on the ankle radiographs. There soft tissue swelling over the lateral malleolus. Calcaneal enthesophytes. Lisfranc articulation appears congruent. IMPRESSION: 1. Dorsal and lateral displacement of the first proximal phalanx relative to the head of the first metatarsal at the first MTP articulation. No acute fracture identified. Soft tissue swelling of the great toe and forefoot. 2. Acute closed oblique fracture of the distal fibula with extension into the ankle joint. 3. Calcaneal enthesopathy.  Electronically Signed   By: Ashley Royalty M.D.   On: 08/02/2017 03:52   Dg Foot Complete Right  Result Date: 08/02/2017 CLINICAL DATA:  Abrasion of the right little toe EXAM: RIGHT FOOT COMPLETE - 3+ VIEW COMPARISON:  None. FINDINGS: Acute oblique fracture of the fifth proximal phalangeal neck without intra-articular extension. Soft tissue swelling is noted of the little toe. Calcaneal enthesopathy is seen along  the plantar aspect. Probable bone island of the body of the calcaneus. Haglund deformity with prominent dorsal bony protuberance of the calcaneus. IMPRESSION: Acute oblique fracture of the right fifth proximal phalangeal neck without intra-articular involvement. Electronically Signed   By: Ashley Royalty M.D.   On: 08/02/2017 03:57   - pertinent xrays, CT, MRI studies were reviewed and independently interpreted  Positive ROS: All other systems have been reviewed and were otherwise negative with the exception of those mentioned in the HPI and as above.  Physical Exam: General: Alert, no acute distress Psychiatric: Patient is competent for consent with normal mood and affect Lymphatic: No axillary or cervical lymphadenopathy Cardiovascular: No pedal edema Respiratory: No cyanosis, no use of accessory musculature GI: No organomegaly, abdomen is soft and non-tender  Skin: Examination patient does have an abrasion over the great toe MTP joint medially there is approximately 7 mm in diameter.  Images:  @ENCIMAGES @   Neurologic: Patient does not have protective sensation bilateral lower extremities.   MUSCULOSKELETAL:  Patient has a good dorsalis pedis pulse.  He has bruising laterally over the ankle he is in an Aircast.  Patient has pain with range of motion of the MTP joint of the great toe and also has some tenderness to palpation over the medial tibial sesamoid.  Review of the radiographs shows a congruent mortise with a nondisplaced fracture Weber B of the left fibula.  There is some  comminution of the medial tibial sesamoid which is most likely chronic with an acute injury.  Assessment: Assessment: Weber B left fibular fracture nondisplaced with injury to a bipartite tibial sesamoid left great toe with abrasion.  Plan: Plan: We will place patient in a fracture boot he is to be nonweightbearing on the left lower extremity I will follow-up in the office in 2 weeks.  Anticipate weightbearing in about 4 weeks.  Thank you for the consult and the opportunity to see Mr. Kyla Balzarine, [place] [phone] 1:39 PM

## 2017-08-02 NOTE — Progress Notes (Signed)
PT Cancellation Note  Patient Details Name: [name] MRN: [id number] DOB: 09-Sep-1965   Cancelled Treatment:    Reason Eval/Treat Not Completed: Other (comment)   Noted Ortho consult still pending; Will await Ortho recs;   Will follow up later today as time allows;  Otherwise, will follow up for PT tomorrow;   Thank you,  Roney Marion, PT  Acute Rehabilitation Services Pager [phone] Office [place] 08/02/2017, 7:56 AM

## 2017-08-02 NOTE — ED Provider Notes (Signed)
Confirmed with Dr. Tyrone Nine, no primary closure of toe laceration.   Montine Circle, PA-C 08/02/17 Ladd, Merrill, DO 08/02/17 [phone]

## 2017-08-02 NOTE — ED Triage Notes (Signed)
Pt arrived by EMS from home where he lives with his sister c/o lethargy and frequent falls tonight. Pt has significant swelling to L ankle and foot and abrasion to R little toe. bruising noted to L eye. Per family and EMS, pt has been more lethargic for the past two days with multiple falls tonight. Sister reports that the patient had a similar episode of lethargy last year shortly before having a psychotic break. Pt reports decreased PO intake, has not taken his insulin because he's not been eating, and has had increased anxiety. EMS gave 150 mcg Fentanyl enroute.

## 2017-08-02 NOTE — Evaluation (Signed)
Physical Therapy Evaluation Patient Details Name: [name] MRN: [id number] DOB: Sep 10, 1965 Today's Date: 08/02/2017   History of Present Illness  [name] is a 52 y.o. male past medical history significant for CKD, suicide attempt, hypertension, peripheral neuropathy, ?early onset dementia vs Parkinson's, bil THR, back surgeries who presents to the emergency room with lethargy.  No reason for lethargy given.  Patient has high history of falls due to peripheral neuropathy and deconditioning.  He was found to have left distal fibula fracture involving left ankle joint, bilateral toe fractures (lt 1st toe sesamoid fx; rt 5th proximal phalanx) along with some lacerations in his left foot with surrounding cellulitis and he was admitted.  Clinical Impression  Pt admitted with above diagnosis. Patient with poor memory and twice during session "forgot" that he was NWB LLE and put full weight on his foot (once standing and once scooting in the bed). Patient does not have 24 hour assist and already has had multiple falls. Pt currently with functional limitations due to the deficits listed below (see PT Problem List).  Pt will benefit from skilled PT to increase their independence and safety with mobility to allow discharge to the venue listed below.       Follow Up Recommendations SNF;Supervision/Assistance - 24 hour    Equipment Recommendations  None recommended by PT(if goes home (not SNF) will need w/c)    Recommendations for Other Services OT consult     Precautions / Restrictions Precautions Precautions: Fall Required Braces or Orthoses: Other Brace/Splint Other Brace/Splint: left camwalker Restrictions Weight Bearing Restrictions: Yes LLE Weight Bearing: Non weight bearing Other Position/Activity Restrictions: no weight-bearing orders for Rt foot; RN told he is WBAT      Mobility  Bed Mobility Overal bed mobility: Needs Assistance Bed Mobility: Sidelying to Sit;Sit to  Sidelying   Sidelying to sit: Min guard(with rail)     Sit to sidelying: Min assist General bed mobility comments: vc for technique as pt c/o back pain; assist to raise LLE onto bed  Transfers Overall transfer level: Needs assistance Equipment used: Rolling walker (2 wheeled) Transfers: Sit to/from Stand Sit to Stand: Min assist         General transfer comment: stood with barely touching lt foot/heel, however once standing put full weight on LLE to adjust rt foot! pt maintained NWB to light TDWB on PT's foot remainder of 1 minute standing  Ambulation/Gait Ambulation/Gait assistance: Min assist Ambulation Distance (Feet): (1/2 foot (one hop fwd and then backward on RLE)) Assistive device: Rolling walker (2 wheeled) Gait Pattern/deviations: Step-to pattern     General Gait Details: maintained NWB LLE wiht good use of bil UEs to take small step fwd/bckwd with RLE  Stairs            Wheelchair Mobility    Modified Rankin (Stroke Patients Only)       Balance Overall balance assessment: Needs assistance;History of Falls Sitting-balance support: No upper extremity supported;Feet unsupported(bed elevated to prevent weight on LLE) Sitting balance-Leahy Scale: Good     Standing balance support: Bilateral upper extremity supported Standing balance-Leahy Scale: Poor                               Pertinent Vitals/Pain Pain Assessment: 0-10 Pain Score: 9  Pain Location: lt ankle  Pain Descriptors / Indicators: Throbbing Pain Intervention(s): Limited activity within patient's tolerance;Repositioned    Home Living Family/patient expects to be  discharged to:: Private residence Living Arrangements: Other relatives(sister and husband) Available Help at Discharge: Family;Available PRN/intermittently(both work (alone from 7 am-4:30 pm)) Type of Home: House Home Access: Stairs to enter Entrance Stairs-Rails: None(shelving he holds onto) [employer]  of Steps: 2 steps in the garage and 2 in the front Home Layout: 1/2 bath on main level;Bed/bath upstairs Home Equipment: Cane - single point;Walker - 2 wheels;Bedside commode(BSC over toilet)      Prior Function Level of Independence: Independent with assistive device(s)               Hand Dominance   Dominant Hand: Right    Extremity/Trunk Assessment   Upper Extremity Assessment Upper Extremity Assessment: Defer to OT evaluation(*pt reports currently sees OPPT for rt shoulder)    Lower Extremity Assessment Lower Extremity Assessment: RLE deficits/detail;LLE deficits/detail RLE Deficits / Details: mild swelling rt foot, ROM WNL, strength grossly intact RLE Sensation: history of peripheral neuropathy LLE Deficits / Details: pt in aircast with cam walker in room; donned cam walker with total assist; hip strength 2+/5 likely limited by pain LLE: Unable to fully assess due to pain LLE Sensation: history of peripheral neuropathy       Communication   Communication: No difficulties  Cognition Arousal/Alertness: Awake/alert Behavior During Therapy: Flat affect Overall Cognitive Status: No family/caregiver present to determine baseline cognitive functioning                                 General Comments: admits to poor memory and cannot relay his medical history      General Comments General comments (skin integrity, edema, etc.): Patient reports he does not think his sister's house is w/c accessible and he has no one to stay with him during the day    Exercises     Assessment/Plan    PT Assessment Patient needs continued PT services  PT Problem List Decreased strength;Decreased activity tolerance;Decreased balance;Decreased mobility;Decreased cognition;Decreased knowledge of use of DME;Decreased safety awareness;Decreased knowledge of precautions;Impaired sensation;Pain       PT Treatment Interventions DME instruction;Gait training;Functional mobility  training;Therapeutic activities;Therapeutic exercise;Balance training;Cognitive remediation;Patient/family education;Wheelchair mobility training    PT Goals (Current goals can be found in the Care Plan section)  Acute Rehab PT Goals Patient Stated Goal: decrease pain PT Goal Formulation: With patient Time For Goal Achievement: 08/16/17 Potential to Achieve Goals: Good    Frequency Min 3X/week   Barriers to discharge Decreased caregiver support;Inaccessible home environment      Co-evaluation               AM-PAC PT "6 Clicks" Daily Activity  Outcome Measure Difficulty turning over in bed (including adjusting bedclothes, sheets and blankets)?: None Difficulty moving from lying on back to sitting on the side of the bed? : A Little Difficulty sitting down on and standing up from a chair with arms (e.g., wheelchair, bedside commode, etc,.)?: A Little Help needed moving to and from a bed to chair (including a wheelchair)?: A Lot Help needed walking in hospital room?: Total Help needed climbing 3-5 steps with a railing? : Total 6 Click Score: 14    End of Session Equipment Utilized During Treatment: Gait belt Activity Tolerance: Patient limited by pain Patient left: in bed;with call bell/phone within reach;with bed alarm set(bil LEs elevated) Nurse Communication: Mobility status;Weight bearing status PT Visit Diagnosis: History of falling (Z91.81);Difficulty in walking, not elsewhere classified (R26.2);Pain    Time:  8676-7209 PT Time Calculation (min) (ACUTE ONLY): 41 min   Charges:   PT Evaluation $PT Eval Moderate Complexity: 1 Mod     PT G Codes:          [employer], PT 08/02/2017, 3:46 PM

## 2017-08-02 NOTE — ED Provider Notes (Signed)
[facility] EMERGENCY DEPARTMENT Provider Note   CSN: [id number] Arrival date & time: 08/02/17  0247     History   Chief Complaint Chief Complaint  Patient presents with  . Foot Injury  . Medical Clearance  . Fatigue    HPI [name] is a 52 y.o. male.  Patient with PMH of DM, HTN, bipolar, presents to the ED with a chief complaint of fatigue and recurrent falls for the past 2 days.  Had dental extraction 3 days ago.  Has been taking pain medications.  Denies fever, chills, nausea, vomiting, or diarrhea.  Complains of left foot and ankle pain and right foot pain.  Denies any other injuries.  The history is provided by the patient. No language interpreter was used.    Past Medical History:  Diagnosis Date  . ADD (attention deficit disorder)   . Anxiety   . Arthritis    right hip  . Bipolar 1 disorder ([place])   . Blood in urine   . CKD (chronic kidney disease), stage III ([place])   . Creatinine elevation   . Depression    bipolar [facility]  . Diabetes mellitus without complication ([place])   . Family history of anesthesia complication    pt is unsure , but pt father may have been difficult to arouse   . HCAP (healthcare-associated pneumonia) 10/31/2012  . History of kidney stones   . Hypertension   . Hypogonadism male   . Liver fatty degeneration   . Microscopic hematuria    hereditary s/p Urology eval  . Osteoarthritis of right hip 11/28/2011   2012 2015 s/p THR Severe  Dr Novella Olive    . Pleural effusion 11/02/2012  . Pneumonia 10-2012  . Pneumonia, organism unspecified(486) 11/02/2012  . Polysubstance dependence, non-opioid, in remission ([place])    remote  . Primary osteoarthritis of left hip 05/22/2015  . PTSD (post-traumatic stress disorder)    SOCIAL ANXIETY DISORDER   . Suicide attempt by multiple drug overdose ([place]) 01/25/2016   Grieving his cat's death 06/28/2015    Patient Active Problem List   Diagnosis Date Noted  . BPH (benign prostatic  hyperplasia) 07/24/2017  . Hemorrhoid 05/27/2017  . Shoulder arthritis 05/27/2017  . Diabetic neuropathy ([place]) 05/27/2017  . Degenerative disc disease, lumbar 02/04/2017  . Constipation   . History of seizures 09/20/2016  . Dementia, early onset with advanced brain atrophy for age 39/05/2016  . Acute lower UTI   . Urinary retention   . Acute metabolic encephalopathy 51/70/0174  . HLD (hyperlipidemia) 09/07/2016  . GERD (gastroesophageal reflux disease) 09/07/2016  . PTSD (post-traumatic stress disorder) 09/07/2016  . Acute encephalopathy 09/07/2016  . Fall 09/07/2016  . Chronic kidney disease   . Ataxia 10/14/2015  . Hypokalemia 10/03/2015  . Hemiparesis ([place])   . Dyslipidemia 05/31/2014  . Insomnia 11/11/2013  . Degenerative joint disease (DJD) of hip 08/16/2013  . Erectile dysfunction 02/17/2011  . Constipation - functional 02/17/2011  . Diabetes type 2, uncontrolled ([place]) 11/06/2010  . Hypogonadism male 05/20/2010  . TOBACCO USER 05/20/2010  . Demoralization and apathy 05/20/2010  . Bipolar disorder ([place]) 01/18/2010  . Depression 01/18/2010  . Right shoulder pain 01/18/2010  . Anxiety state 12/05/2006  . Essential hypertension 12/05/2006    Past Surgical History:  Procedure Laterality Date  . BACK SURGERY    . CLOSED REDUCTION METACARPAL WITH PERCUTANEOUS PINNING Right   . LUMBAR DISC SURGERY    . TONSILLECTOMY    .  TOTAL HIP ARTHROPLASTY Right 08/16/2013   Procedure: TOTAL HIP ARTHROPLASTY ANTERIOR APPROACH;  Surgeon: Hessie Dibble, MD;  Location: [facility];  Service: Orthopedics;  Laterality: Right;  . TOTAL HIP ARTHROPLASTY Left 05/22/2015   Procedure: TOTAL HIP ARTHROPLASTY ANTERIOR APPROACH;  Surgeon: Melrose Nakayama, MD;  Location: [facility];  Service: Orthopedics;  Laterality: Left;        Home Medications    Prior to Admission medications   Medication Sig Start Date End Date Taking? Authorizing Provider  atorvastatin (LIPITOR) 10 MG tablet Take 1 tablet (10  mg total) by mouth daily. 02/04/17   [name], MD  Blood Glucose Monitoring Suppl (ACCU-CHEK AVIVA) device Use as instructed daily. 05/27/17   [name], MD  Blood Glucose Monitoring Suppl (ACCU-CHEK GUIDE) w/Device KIT 1 Device by Does not apply route daily. Use to check BS twice a day 06/25/16   Plotnikov, Evie Lacks, MD  buPROPion (WELLBUTRIN XL) 300 MG 24 hr tablet Take 1 tablet (300 mg total) by mouth daily. 10/13/16   Elgergawy, Silver Huguenin, MD  cyanocobalamin 500 MCG tablet Take 1 tablet (500 mcg total) by mouth daily. 10/13/16   Elgergawy, Silver Huguenin, MD  diclofenac sodium (VOLTAREN) 1 % GEL Apply 4 g topically 4 (four) times daily. 02/04/17   [name], MD  divalproex (DEPAKOTE) 500 MG DR tablet Take 3 tablets (1,500 mg total) by mouth at bedtime. 10/13/16 10/13/17  Elgergawy, Silver Huguenin, MD  glucose blood (ACCU-CHEK AVIVA) test strip Use as instructed 3 x daily 05/27/17   [name], MD  glucose blood (ACCU-CHEK GUIDE) test strip 1 each by Other route 2 (two) times daily. Use to check blood sugars twice a day 06/25/16   Plotnikov, Evie Lacks, MD  Insulin Glargine (LANTUS) 100 UNIT/ML Solostar Pen Inject 32 Units into the skin daily at 10 pm. 05/27/17   [name], MD  Insulin Pen Needle (TRUEPLUS PEN NEEDLES) 31G X 5 MM MISC USE AS DIRECTED 02/12/17   [name], MD  Lancets Misc. (ACCU-CHEK SOFTCLIX LANCET DEV) KIT 1 each by Does not apply route 3 (three) times daily with meals. 05/27/17   [name], MD  methocarbamol (ROBAXIN) 500 MG tablet Take 2 tablets (1,000 mg total) by mouth 2 (two) times daily. 05/27/17   [name], MD  pregabalin (LYRICA) 75 MG capsule Take 1 capsule (75 mg total) by mouth 2 (two) times daily. Patient not taking: Reported on 06/23/2017 05/27/17   [name], MD  QUEtiapine (SEROQUEL) 400 MG tablet Take 1 tablet (400 mg total) by mouth at bedtime. 10/13/16   Elgergawy, Silver Huguenin, MD  tadalafil (CIALIS) 5 MG tablet TAKE 1 TABLET (5 MG TOTAL) BY  MOUTH DAILY Patient not taking: Reported on 06/23/2017 05/27/17   [name], MD  Testosterone (ANDROGEL) 20.25 MG/1.25GM (1.62%) GEL Apply 20.25 mg topically every morning. Apply topically under arm pit alternating with each application. Patient not taking: Reported on 06/23/2017 05/27/17   [name], MD  traMADol (ULTRAM) 50 MG tablet TAKE 1 TABLET BY MOUTH AT BEDTIME. Patient not taking: Reported on 06/23/2017 05/21/17   [name], MD  traZODone (DESYREL) 150 MG tablet Take 1 tablet (150 mg total) by mouth at bedtime. 10/13/16   Elgergawy, Silver Huguenin, MD    Family History Family History  Problem Relation Age of Onset  . Diabetes Father   . Cancer Mother        died of melanoma with mets  . Cervical cancer Sister   . Diabetes  Sister   . Other Neg Hx        hypogonadism    Social History Social History   Tobacco Use  . Smoking status: Former Smoker    Years: 0.00    Types: Cigarettes    Last attempt to quit: 02/19/2014    Years since quitting: 3.4  . Smokeless tobacco: Never Used  Substance Use Topics  . Alcohol use: No  . Drug use: No    Comment: hx of marijuana/cocaine/crack use but sober since 20's     Allergies   Vicodin [hydrocodone-acetaminophen]   Review of Systems Review of Systems  All other systems reviewed and are negative.    Physical Exam Updated Vital Signs BP 123/79 (BP Location: Right Arm)   Pulse 99   Temp 98.7 F (37.1 C) (Oral)   Resp 16   SpO2 100%   Physical Exam  Constitutional: He is oriented to person, place, and time. He appears well-developed and well-nourished.  HENT:  Head: Normocephalic and atraumatic.  Eyes: Pupils are equal, round, and reactive to light. Conjunctivae and EOM are normal. Right eye exhibits no discharge. Left eye exhibits no discharge. No scleral icterus.  Neck: Normal range of motion. Neck supple. No JVD present.  Cardiovascular: Normal rate, regular rhythm and normal heart sounds. Exam reveals no gallop  and no friction rub.  No murmur heard. Pulmonary/Chest: Effort normal and breath sounds normal. No respiratory distress. He has no wheezes. He has no rales. He exhibits no tenderness.  Abdominal: Soft. He exhibits no distension and no mass. There is no tenderness. There is no rebound and no guarding.  Musculoskeletal: Normal range of motion. He exhibits no edema or tenderness.  Neurological: He is alert and oriented to person, place, and time.  Skin: Skin is warm and dry.  1 cm laceration at the base of the right 5th toe on the palmar aspect, no FB  Cellulitis of left forefoot as pictured, small abrasion as pictured  Psychiatric: He has a normal mood and affect. His behavior is normal. Judgment and thought content normal.  Nursing note and vitals reviewed.        ED Treatments / Results  Labs (all labs ordered are listed, but only abnormal results are displayed) Labs Reviewed  URINALYSIS, ROUTINE W REFLEX MICROSCOPIC  CBC WITH DIFFERENTIAL/PLATELET  COMPREHENSIVE METABOLIC PANEL  RAPID URINE DRUG SCREEN, HOSP PERFORMED  ETHANOL  SALICYLATE LEVEL  ACETAMINOPHEN LEVEL  CBG MONITORING, ED    EKG None  Radiology Dg Tibia/fibula Left  Result Date: 08/02/2017 CLINICAL DATA:  Left leg pain after fall. EXAM: LEFT TIBIA AND FIBULA - 2 VIEW COMPARISON:  None. FINDINGS: Nondisplaced acute closed distal fibular fracture extending into the ankle joint without widening of the ankle mortise is noted. Lateral soft tissue swelling of the distal leg and ankle. Small calcaneal enthesophytes are present. No joint dislocation. Mild degenerative medial femorotibial joint space narrowing of the knee. IMPRESSION: Acute nondisplaced distal fibular diaphyseal fracture extending into the ankle joint. Overlying soft tissue swelling is noted. Electronically Signed   By: Ashley Royalty M.D.   On: 08/02/2017 03:59   Dg Ankle Complete Left  Result Date: 08/02/2017 CLINICAL DATA:  Left ankle swelling after  fall. EXAM: LEFT ANKLE COMPLETE - 3+ VIEW COMPARISON:  None. FINDINGS: There is an acute, closed, oblique intra-articular fracture of the distal fibula extending into the ankle joint. No widening of the ankle mortise. There is soft tissue swelling over the lateral aspect of the included  leg and over the lateral malleolus. Small calcaneal enthesophytes are present. The subtalar and midfoot articulations are intact. IMPRESSION: Acute, nondisplaced oblique fracture of the distal fibula extending into the ankle joint. Lateral soft tissue swelling of the included distal leg and ankle. Electronically Signed   By: Ashley Royalty M.D.   On: 08/02/2017 03:49   Ct Head Wo Contrast  Result Date: 08/02/2017 CLINICAL DATA:  Lethargy. Frequent falls tonight. Neck pain. Initial exam. EXAM: CT HEAD WITHOUT CONTRAST CT CERVICAL SPINE WITHOUT CONTRAST TECHNIQUE: Multidetector CT imaging of the head and cervical spine was performed following the standard protocol without intravenous contrast. Multiplanar CT image reconstructions of the cervical spine were also generated. COMPARISON:  Head CT and brain MRI May 2018 FINDINGS: CT HEAD FINDINGS Brain: Stable by age advanced atrophy. No intracranial hemorrhage, mass effect, or midline shift. No hydrocephalus. The basilar cisterns are patent. No evidence of territorial infarct or acute ischemia. No extra-axial or intracranial fluid collection. Vascular: No hyperdense vessel or unexpected calcification. Skull: No fracture or focal lesion. Sinuses/Orbits: Paranasal sinuses and mastoid air cells are clear. The visualized orbits are unremarkable. Other: None. CT CERVICAL SPINE FINDINGS Alignment: Normal. Skull base and vertebrae: No acute fracture. Vertebral body heights are maintained. The dens and skull base are intact. Soft tissues and spinal canal: No prevertebral fluid or swelling. No visible canal hematoma. Disc levels: Disc space narrowing and endplate spurring at G6-K5 with large  peripherally calcified disc osteophyte complex. This causes narrowing of the spinal canal and bilateral foramina at this level. Lesser endplate spurring at L9-J5 and C4-C5. Upper chest: 2 right thyroid nodules both less than 15 mm, no further evaluation recommended based on size. No acute findings. Other: None. IMPRESSION: 1.  No acute intracranial abnormality. 2. Age advanced cerebral atrophy, stable from May 2018. 3. No acute fracture or subluxation of the cervical spine. 4. Degenerative change in the cervical spine with spinal canal and neural foraminal stenosis at C5-C6. Electronically Signed   By: Jeb Levering M.D.   On: 08/02/2017 03:36   Ct Cervical Spine Wo Contrast  Result Date: 08/02/2017 CLINICAL DATA:  Lethargy. Frequent falls tonight. Neck pain. Initial exam. EXAM: CT HEAD WITHOUT CONTRAST CT CERVICAL SPINE WITHOUT CONTRAST TECHNIQUE: Multidetector CT imaging of the head and cervical spine was performed following the standard protocol without intravenous contrast. Multiplanar CT image reconstructions of the cervical spine were also generated. COMPARISON:  Head CT and brain MRI May 2018 FINDINGS: CT HEAD FINDINGS Brain: Stable by age advanced atrophy. No intracranial hemorrhage, mass effect, or midline shift. No hydrocephalus. The basilar cisterns are patent. No evidence of territorial infarct or acute ischemia. No extra-axial or intracranial fluid collection. Vascular: No hyperdense vessel or unexpected calcification. Skull: No fracture or focal lesion. Sinuses/Orbits: Paranasal sinuses and mastoid air cells are clear. The visualized orbits are unremarkable. Other: None. CT CERVICAL SPINE FINDINGS Alignment: Normal. Skull base and vertebrae: No acute fracture. Vertebral body heights are maintained. The dens and skull base are intact. Soft tissues and spinal canal: No prevertebral fluid or swelling. No visible canal hematoma. Disc levels: Disc space narrowing and endplate spurring at T0-V7 with  large peripherally calcified disc osteophyte complex. This causes narrowing of the spinal canal and bilateral foramina at this level. Lesser endplate spurring at B9-T9 and C4-C5. Upper chest: 2 right thyroid nodules both less than 15 mm, no further evaluation recommended based on size. No acute findings. Other: None. IMPRESSION: 1.  No acute intracranial abnormality. 2. Age advanced  cerebral atrophy, stable from May 2018. 3. No acute fracture or subluxation of the cervical spine. 4. Degenerative change in the cervical spine with spinal canal and neural foraminal stenosis at C5-C6. Electronically Signed   By: Jeb Levering M.D.   On: 08/02/2017 03:36   Dg Foot Complete Left  Result Date: 08/02/2017 CLINICAL DATA:  Post reduction left foot. EXAM: LEFT FOOT - COMPLETE 3+ VIEW COMPARISON:  Pre reduction radiographs earlier this day. FINDINGS: Great toe dislocation has been reduced and is now anatomic. Small osseous fragments about the dorsal medial metatarsal phalangeal joint may reflect fractured sesamoid. Distal fibular fracture again seen. Dorsal soft tissue edema. IMPRESSION: Normal alignment of the first ray post great toe reduction. Small fracture fragments about the plantar medial first metatarsal phalangeal joint may represent fractured tibial sesamoid. Electronically Signed   By: Jeb Levering M.D.   On: 08/02/2017 04:59   Dg Foot Complete Left  Result Date: 08/02/2017 CLINICAL DATA:  Abrasion of the right little toe soft tissue swelling of the left ankle and foot. EXAM: LEFT FOOT - COMPLETE 3+ VIEW COMPARISON:  None. FINDINGS: Dorsal and lateral dislocation of the great toe at the first MTP joint. No acute fracture of the great toe. Acute closed oblique fracture of the distal fibula extending into the ankle joint better visualized on the ankle radiographs. There soft tissue swelling over the lateral malleolus. Calcaneal enthesophytes. Lisfranc articulation appears congruent. IMPRESSION: 1. Dorsal  and lateral displacement of the first proximal phalanx relative to the head of the first metatarsal at the first MTP articulation. No acute fracture identified. Soft tissue swelling of the great toe and forefoot. 2. Acute closed oblique fracture of the distal fibula with extension into the ankle joint. 3. Calcaneal enthesopathy. Electronically Signed   By: Ashley Royalty M.D.   On: 08/02/2017 03:52   Dg Foot Complete Right  Result Date: 08/02/2017 CLINICAL DATA:  Abrasion of the right little toe EXAM: RIGHT FOOT COMPLETE - 3+ VIEW COMPARISON:  None. FINDINGS: Acute oblique fracture of the fifth proximal phalangeal neck without intra-articular extension. Soft tissue swelling is noted of the little toe. Calcaneal enthesopathy is seen along the plantar aspect. Probable bone island of the body of the calcaneus. Haglund deformity with prominent dorsal bony protuberance of the calcaneus. IMPRESSION: Acute oblique fracture of the right fifth proximal phalangeal neck without intra-articular involvement. Electronically Signed   By: Ashley Royalty M.D.   On: 08/02/2017 03:57    Procedures Reduction of dislocation Date/Time: 08/02/2017 4:59 AM Performed by: Montine Circle, PA-C Authorized by: Montine Circle, PA-C  Consent: Verbal consent obtained. Risks and benefits: risks, benefits and alternatives were discussed Consent given by: patient Patient understanding: patient states understanding of the procedure being performed Patient consent: the patient's understanding of the procedure matches consent given Procedure consent: procedure consent matches procedure scheduled Relevant documents: relevant documents present and verified Test results: test results available and properly labeled Site marked: the operative site was marked Imaging studies: imaging studies available Required items: required blood products, implants, devices, and special equipment available Patient identity confirmed: verbally with  patient Time out: Immediately prior to procedure a "time out" was called to verify the correct patient, procedure, equipment, support staff and site/side marked as required. Local anesthesia used: no  Anesthesia: Local anesthesia used: no  Sedation: Patient sedated: no  Patient tolerance: Patient tolerated the procedure well with no immediate complications Comments: Reduction of left great toe without complication.  Buddy taped.    (including  critical care time)  Medications Ordered in ED Medications - No data to display   Initial Impression / Assessment and Plan / ED Course  I have reviewed the triage vital signs and the nursing notes.  Pertinent labs & imaging results that were available during my care of the patient were reviewed by me and considered in my medical decision making (see chart for details).    Patient comes in with several falls over the past couple of days.  He is accompanied by his sister, who states that he has been very weak and fatigued.  He had dental extractions on Thursday last week and was given pain medicine.  He has been taking this.  This could be contributing to his falls.  He also has degenerative disc disease and lumbar radiculopathy which could cause him to be unstable as well.  Prior records reveal some concern for Parkinson's, which could also be causing his ataxia.  On exam today he has a swollen left ankle and foot.  Plain films reveal fractures of the distal fibula into the ankle joint.  He also has dislocation of his left great toe, which was reduced by me.  There is concern for cellulitis about the left foot extending towards the ankle without discharge or purulence.  Given the patient is a diabetic, will start antibiotic therapy for diabetic foot.  Patient has multiple etiologies that could be causing him to fall.  Laboratory workup is notable for potassium of 6.1, but patient has no EKG changes.  Will give some kayexalate.  I will apply an Aircast  to the left ankle as to not obscure the cellulitis of the foot.  This is not ideal, but may provide for sufficient immobilization without obscuring the cellulitis.  Patient has a laceration of his right little toe on the palmar aspect near the base.  There is an underlying fracture.  It is unclear how long this fracture has been open, but has at least been open since yesterday afternoon.  Risk of infection with primary closure in this diabetic patient is great, therefore will allow for healing by secondary intent.  This will need to be monitored closely.  Patient seen by and discussed with Dr. Tyrone Nine, who agrees with the plan.  Appreciate Dr. Aggie Moats from [facility], who will admit the patient.   Final Clinical Impressions(s) / ED Diagnoses   Final diagnoses:  Fall, initial encounter  Cellulitis of left lower extremity  Closed fracture of distal end of left fibula, unspecified fracture morphology, initial encounter  Closed dislocation of great toe of left foot, initial encounter  Laceration of lesser toe of right foot without foreign body present or damage to nail, initial encounter  Hyperkalemia    ED Discharge Orders    None       Montine Circle, PA-C 08/02/17 [place], [place], DO 08/02/17 [phone]

## 2017-08-02 NOTE — Progress Notes (Signed)
OT Cancellation    08/02/17 0800  OT Visit Information  Last OT Received On 08/02/17  Reason Eval/Treat Not Completed Other (comment). Noted Ortho consult still pending and will await Ortho recs. Will return as schedule allows. Thank you.   [place], OTR/L Acute Rehab Pager: [phone] Office: [phone]

## 2017-08-03 ENCOUNTER — Ambulatory Visit: Payer: Medicaid Other | Admitting: Physical Therapy

## 2017-08-03 DIAGNOSIS — W19XXXD Unspecified fall, subsequent encounter: Secondary | ICD-10-CM | POA: Diagnosis not present

## 2017-08-03 DIAGNOSIS — F339 Major depressive disorder, recurrent, unspecified: Secondary | ICD-10-CM | POA: Diagnosis not present

## 2017-08-03 DIAGNOSIS — E11649 Type 2 diabetes mellitus with hypoglycemia without coma: Secondary | ICD-10-CM

## 2017-08-03 DIAGNOSIS — N179 Acute kidney failure, unspecified: Secondary | ICD-10-CM | POA: Diagnosis present

## 2017-08-03 DIAGNOSIS — F419 Anxiety disorder, unspecified: Secondary | ICD-10-CM | POA: Diagnosis present

## 2017-08-03 DIAGNOSIS — S81812A Laceration without foreign body, left lower leg, initial encounter: Secondary | ICD-10-CM | POA: Diagnosis present

## 2017-08-03 DIAGNOSIS — S8262XD Displaced fracture of lateral malleolus of left fibula, subsequent encounter for closed fracture with routine healing: Secondary | ICD-10-CM | POA: Diagnosis not present

## 2017-08-03 DIAGNOSIS — F319 Bipolar disorder, unspecified: Secondary | ICD-10-CM | POA: Diagnosis present

## 2017-08-03 DIAGNOSIS — S91114A Laceration without foreign body of right lesser toe(s) without damage to nail, initial encounter: Secondary | ICD-10-CM | POA: Diagnosis not present

## 2017-08-03 DIAGNOSIS — D696 Thrombocytopenia, unspecified: Secondary | ICD-10-CM | POA: Diagnosis present

## 2017-08-03 DIAGNOSIS — G20 Parkinson's disease: Secondary | ICD-10-CM | POA: Diagnosis present

## 2017-08-03 DIAGNOSIS — E1165 Type 2 diabetes mellitus with hyperglycemia: Secondary | ICD-10-CM | POA: Diagnosis present

## 2017-08-03 DIAGNOSIS — F0390 Unspecified dementia without behavioral disturbance: Secondary | ICD-10-CM | POA: Diagnosis present

## 2017-08-03 DIAGNOSIS — G40909 Epilepsy, unspecified, not intractable, without status epilepticus: Secondary | ICD-10-CM | POA: Diagnosis present

## 2017-08-03 DIAGNOSIS — S82832A Other fracture of upper and lower end of left fibula, initial encounter for closed fracture: Secondary | ICD-10-CM | POA: Diagnosis not present

## 2017-08-03 DIAGNOSIS — E1122 Type 2 diabetes mellitus with diabetic chronic kidney disease: Secondary | ICD-10-CM | POA: Diagnosis present

## 2017-08-03 DIAGNOSIS — E1140 Type 2 diabetes mellitus with diabetic neuropathy, unspecified: Secondary | ICD-10-CM | POA: Diagnosis present

## 2017-08-03 DIAGNOSIS — N183 Chronic kidney disease, stage 3 (moderate): Secondary | ICD-10-CM | POA: Diagnosis not present

## 2017-08-03 DIAGNOSIS — S92402A Displaced unspecified fracture of left great toe, initial encounter for closed fracture: Secondary | ICD-10-CM | POA: Diagnosis present

## 2017-08-03 DIAGNOSIS — E785 Hyperlipidemia, unspecified: Secondary | ICD-10-CM | POA: Diagnosis present

## 2017-08-03 DIAGNOSIS — L03116 Cellulitis of left lower limb: Secondary | ICD-10-CM | POA: Diagnosis not present

## 2017-08-03 DIAGNOSIS — S8262XA Displaced fracture of lateral malleolus of left fibula, initial encounter for closed fracture: Secondary | ICD-10-CM | POA: Diagnosis present

## 2017-08-03 DIAGNOSIS — G319 Degenerative disease of nervous system, unspecified: Secondary | ICD-10-CM | POA: Diagnosis present

## 2017-08-03 DIAGNOSIS — F250 Schizoaffective disorder, bipolar type: Secondary | ICD-10-CM | POA: Diagnosis present

## 2017-08-03 DIAGNOSIS — R296 Repeated falls: Secondary | ICD-10-CM | POA: Diagnosis present

## 2017-08-03 DIAGNOSIS — F0391 Unspecified dementia with behavioral disturbance: Secondary | ICD-10-CM | POA: Diagnosis not present

## 2017-08-03 DIAGNOSIS — K760 Fatty (change of) liver, not elsewhere classified: Secondary | ICD-10-CM | POA: Diagnosis present

## 2017-08-03 DIAGNOSIS — F3175 Bipolar disorder, in partial remission, most recent episode depressed: Secondary | ICD-10-CM | POA: Diagnosis not present

## 2017-08-03 DIAGNOSIS — W19XXXA Unspecified fall, initial encounter: Secondary | ICD-10-CM | POA: Diagnosis present

## 2017-08-03 DIAGNOSIS — M160 Bilateral primary osteoarthritis of hip: Secondary | ICD-10-CM | POA: Diagnosis present

## 2017-08-03 DIAGNOSIS — Z87891 Personal history of nicotine dependence: Secondary | ICD-10-CM | POA: Diagnosis not present

## 2017-08-03 DIAGNOSIS — E875 Hyperkalemia: Secondary | ICD-10-CM | POA: Diagnosis present

## 2017-08-03 LAB — BASIC METABOLIC PANEL
ANION GAP: 10 (ref 5–15)
BUN: 20 mg/dL (ref 6–20)
CHLORIDE: 104 mmol/L (ref 101–111)
CO2: 25 mmol/L (ref 22–32)
Calcium: 8.8 mg/dL — ABNORMAL LOW (ref 8.9–10.3)
Creatinine, Ser: 1.77 mg/dL — ABNORMAL HIGH (ref 0.61–1.24)
GFR calc Af Amer: 50 mL/min — ABNORMAL LOW (ref >60)
GFR, EST NON AFRICAN AMERICAN: 43 mL/min — AB (ref >60)
Glucose, Bld: 126 mg/dL — ABNORMAL HIGH (ref 65–99)
POTASSIUM: 3.9 mmol/L (ref 3.5–5.1)
Sodium: 139 mmol/L (ref 135–145)

## 2017-08-03 LAB — CBC
HEMATOCRIT: 33.1 % — AB (ref 39.0–52.0)
HEMOGLOBIN: 11.7 g/dL — AB (ref 13.0–17.0)
MCH: 31.1 pg (ref 26.0–34.0)
MCHC: 35.3 g/dL (ref 30.0–36.0)
MCV: 88 fL (ref 78.0–100.0)
Platelets: 75 10*3/uL — ABNORMAL LOW (ref 150–400)
RBC: 3.76 MIL/uL — ABNORMAL LOW (ref 4.22–5.81)
RDW: 12.8 % (ref 11.5–15.5)
WBC: 5.7 10*3/uL (ref 4.0–10.5)

## 2017-08-03 MED ORDER — SODIUM CHLORIDE 0.9 % IV SOLN
INTRAVENOUS | Status: DC
  Administered 2017-08-03 – 2017-08-05 (×3): via INTRAVENOUS

## 2017-08-03 NOTE — Progress Notes (Signed)
Physical Therapy Treatment Patient Details Name: [name] MRN: [id number] DOB: October 03, 1965 Today's Date: 08/03/2017    History of Present Illness [name] is a 52 y.o. male past medical history significant for CKD, suicide attempt, hypertension, peripheral neuropathy, ?early onset dementia vs Parkinson's, bil THR, back surgeries who presents to the emergency room with lethargy.  No reason for lethargy given.  Patient has high history of falls due to peripheral neuropathy and deconditioning.  He was found to have left distal fibula fracture involving left ankle joint, bilateral toe fractures (lt 1st toe sesamoid fx; rt 5th proximal phalanx) along with some lacerations in his left foot with surrounding cellulitis and he was admitted.    PT Comments    Pt progressing slowly towards physical therapy goals.  Pt continues to require +2 assist for safe mobility and OOB to chair with RW. Pt reports he will be home alone upon d/c, and strongly feel he is unsafe to be alone at this time. Recommend continued skilled therapy services at the SNF level to maximize functional independence and safety in a home environment. Will continue to follow.   Follow Up Recommendations  SNF;Supervision/Assistance - 24 hour     Equipment Recommendations  None recommended by PT(TBD by next venue of care. If going home, will need W/C)    Recommendations for Other Services       Precautions / Restrictions Precautions Precautions: Fall Required Braces or Orthoses: Other Brace/Splint Other Brace/Splint: left camwalker Restrictions Weight Bearing Restrictions: Yes LLE Weight Bearing: Non weight bearing Other Position/Activity Restrictions: no weight-bearing orders for Rt foot; RN told he is WBAT    Mobility  Bed Mobility Overal bed mobility: Needs Assistance Bed Mobility: Supine to Sit   Sidelying to sit: Mod assist;+2 for safety/equipment Supine to sit: Mod assist;HOB elevated     General bed  mobility comments: Pt was able to advance LE's towards EOB. Assist provided to elevate trunk into full sitting position.   Transfers Overall transfer level: Needs assistance Equipment used: Rolling walker (2 wheeled) Transfers: Sit to/from [facility] Sit to Stand: Mod assist;+2 physical assistance;+2 safety/equipment;From elevated surface Stand pivot transfers: Mod assist;+2 physical assistance;+2 safety/equipment       General transfer comment: ModA+2 from elevated bed; VCs for safe hand/L LE placement and max verbal cues for maintaining NWB in LLE; pt unable to fully maintain NWB during transfer completion despite cues; significant back pain during mobility  Ambulation/Gait                 Stairs             Wheelchair Mobility    Modified Rankin (Stroke Patients Only)       Balance Overall balance assessment: Needs assistance;History of Falls Sitting-balance support: No upper extremity supported;Feet unsupported Sitting balance-Leahy Scale: Good     Standing balance support: Bilateral upper extremity supported Standing balance-Leahy Scale: Poor Standing balance comment: reliant on UE support/external support from therapist                             Cognition Arousal/Alertness: Awake/alert Behavior During Therapy: Flat affect Overall Cognitive Status: No family/caregiver present to determine baseline cognitive functioning                                 General Comments: intermittent confusion noted during session; questioning whether it  is 9 "AM or PM" end of session.       Exercises      General Comments        Pertinent Vitals/Pain Pain Assessment: Faces Faces Pain Scale: Hurts whole lot Pain Location: back>L ankle Pain Descriptors / Indicators: Grimacing;Guarding;Discomfort Pain Intervention(s): Monitored during session;Limited activity within patient's tolerance    Home Living Family/patient  expects to be discharged to:: Private residence Living Arrangements: Other relatives(sister and brother-in-law) Available Help at Discharge: Family;Available PRN/intermittently(both sister and brother-in-law work ) Type of Home: House Home Access: Stairs to enter Entrance Stairs-Rails: None Home Layout: 1/2 bath on main level;Bed/bath upstairs Home Equipment: Kasandra Knudsen - single point;Walker - 2 wheels;Bedside commode      Prior Function Level of Independence: Independent with assistive device(s)          PT Goals (current goals can now be found in the care plan section) Acute Rehab PT Goals Patient Stated Goal: decrease pain PT Goal Formulation: With patient Time For Goal Achievement: 08/16/17 Potential to Achieve Goals: Good Progress towards PT goals: Progressing toward goals    Frequency    Min 3X/week      PT Plan Current plan remains appropriate    Co-evaluation PT/OT/SLP Co-Evaluation/Treatment: Yes Reason for Co-Treatment: Complexity of the patient's impairments (multi-system involvement);Necessary to address cognition/behavior during functional activity;To address functional/ADL transfers PT goals addressed during session: Mobility/safety with mobility;Proper use of DME;Balance OT goals addressed during session: ADL's and self-care;Proper use of Adaptive equipment and DME      AM-PAC PT "6 Clicks" Daily Activity  Outcome Measure  Difficulty turning over in bed (including adjusting bedclothes, sheets and blankets)?: None Difficulty moving from lying on back to sitting on the side of the bed? : A Little Difficulty sitting down on and standing up from a chair with arms (e.g., wheelchair, bedside commode, etc,.)?: A Lot Help needed moving to and from a bed to chair (including a wheelchair)?: A Lot Help needed walking in hospital room?: Total Help needed climbing 3-5 steps with a railing? : Total 6 Click Score: 13    End of Session Equipment Utilized During Treatment:  Gait belt Activity Tolerance: Patient limited by pain Patient left: in chair;with call bell/phone within reach(bil LEs elevated) Nurse Communication: Mobility status;Weight bearing status PT Visit Diagnosis: History of falling (Z91.81);Difficulty in walking, not elsewhere classified (R26.2);Pain Pain - part of body: (back)     Time: 0821-0900 PT Time Calculation (min) (ACUTE ONLY): 39 min  Charges:  $Gait Training: 23-37 mins                    G Codes:       Rolinda Roan, PT, DPT Acute Rehabilitation Services Pager: [phone]    Thelma Comp 08/03/2017, 12:22 PM

## 2017-08-03 NOTE — Evaluation (Signed)
Occupational Therapy Evaluation Patient Details Name: EDUARD PENKALA MRN: [id number] DOB: [birth year] Today's Date: 08/03/2017    History of Present Illness RAKESH DUTKO is a 52 y.o. male past medical history significant for CKD, suicide attempt, hypertension, peripheral neuropathy, ?early onset dementia vs Parkinson's, bil THR, back surgeries who presents to the emergency room with lethargy.  No reason for lethargy given.  Patient has high history of falls due to peripheral neuropathy and deconditioning.  He was found to have left distal fibula fracture involving left ankle joint, bilateral toe fractures (lt 1st toe sesamoid fx; rt 5th proximal phalanx) along with some lacerations in his left foot with surrounding cellulitis and he was admitted.   Clinical Impression   This 52 y/o M presents with the above. At baseline pt is independent with ADLs and mod independent with functional mobility. Pt presenting with increased pain in back and LLE, decreased activity tolerance and dynamic balance. Pt currently requiring ModA+2 for sit<>stand and stand pivot transfers using RW, requiring Max cues for maintaining NWB and pt with significant difficulty maintaining during mobility; Currently requires maxA for LB ADLs. Pt reports he would be home alone during the day as his family works. Due to pt's current assist level and decreased caregiver support recommend SNF level OT services prior to discharge home to maximize his safety and independence with ADLs and mobility. Will follow acutely to progress pt towards established OT goals.     Follow Up Recommendations  SNF;Supervision/Assistance - 24 hour    Equipment Recommendations  Other (comment)(TBD in next venue )           Precautions / Restrictions Precautions Precautions: Fall Required Braces or Orthoses: Other Brace/Splint Other Brace/Splint: left camwalker Restrictions Weight Bearing Restrictions: Yes LLE Weight Bearing: Non weight bearing       Mobility Bed Mobility   Bed Mobility: Sidelying to Sit   Sidelying to sit: Mod assist;+2 for safety/equipment       General bed mobility comments: assist to elevate trunk into sitting  Transfers Overall transfer level: Needs assistance Equipment used: Rolling walker (2 wheeled) Transfers: Sit to/from [facility] Sit to Stand: Mod assist;+2 physical assistance;+2 safety/equipment;From elevated surface Stand pivot transfers: Mod assist;+2 physical assistance;+2 safety/equipment       General transfer comment: ModA+2 from elevated bed; VCs for safe hand/L LE placement and max verbal cues for maintaining NWB in LLE; pt unable to fully maintain NWB during transfer completion despite cues; significant back pain during mobility    Balance Overall balance assessment: Needs assistance;History of Falls Sitting-balance support: No upper extremity supported;Feet unsupported Sitting balance-Leahy Scale: Good     Standing balance support: Bilateral upper extremity supported Standing balance-Leahy Scale: Poor Standing balance comment: reliant on UE support/external support from therapist                            ADL either performed or assessed with clinical judgement   ADL Overall ADL's : Needs assistance/impaired Eating/Feeding: Sitting;Modified independent   Grooming: Set up;Sitting   Upper Body Bathing: Min guard;Sitting   Lower Body Bathing: Moderate assistance;+2 for physical assistance;+2 for safety/equipment;Sitting/lateral leans   Upper Body Dressing : Minimal assistance;Sitting Upper Body Dressing Details (indicate cue type and reason): donning new gown  Lower Body Dressing: Maximal assistance;+2 for physical assistance;+2 for safety/equipment;Sitting/lateral leans Lower Body Dressing Details (indicate cue type and reason): began education regarding completing LB ADLs via lateral leans vs sit<>stand  Toilet Transfer: Moderate assistance;+2  for physical assistance;+2 for safety/equipment;Stand-pivot;BSC;RW Toilet Transfer Details (indicate cue type and reason): simulated in transfer EOB>recliner  Toileting- Clothing Manipulation and Hygiene: Maximal assistance;+2 for physical assistance;+2 for safety/equipment;Sitting/lateral lean       Functional mobility during ADLs: Moderate assistance;+2 for physical assistance;+2 for safety/equipment;Rolling walker General ADL Comments: Pt currently with significant difficulty maintaining NWB during transfers; completed stand pivot to recliner and partial sit<>stand from recliner for repositioning and placement of pillows                         Pertinent Vitals/Pain Pain Assessment: Faces Faces Pain Scale: Hurts whole lot Pain Location: back>L ankle Pain Descriptors / Indicators: Grimacing;Guarding;Discomfort Pain Intervention(s): Limited activity within patient's tolerance;Monitored during session;Patient requesting pain meds-RN notified     Hand Dominance Right   Extremity/Trunk Assessment Upper Extremity Assessment Upper Extremity Assessment: Generalized weakness;RUE deficits/detail RUE Deficits / Details: pt currently seeing OPPT for R shoulder, AROM WFL    Lower Extremity Assessment Lower Extremity Assessment: Defer to PT evaluation       Communication Communication Communication: No difficulties   Cognition Arousal/Alertness: Awake/alert Behavior During Therapy: Flat affect Overall Cognitive Status: No family/caregiver present to determine baseline cognitive functioning                                 General Comments: intermittent confusion noted during session; questioning whether it is 9 "AM or PM" end of session.                     Home Living Family/patient expects to be discharged to:: Private residence Living Arrangements: Other relatives(sister and brother-in-law) Available Help at Discharge: Family;Available  PRN/intermittently(both sister and brother-in-law work ) Type of Home: House Home Access: Stairs to enter [employer] of Steps: 2 steps in the garage and 2 in the front Entrance Stairs-Rails: None Home Layout: 1/2 bath on main level;Bed/bath upstairs     Bathroom Shower/Tub: Tub/shower unit;Curtain   Bathroom Toilet: Standard Bathroom Accessibility: Yes   Home Equipment: Cane - single point;Walker - 2 wheels;Bedside commode          Prior Functioning/Environment Level of Independence: Independent with assistive device(s)                 OT Problem List: Decreased strength;Impaired balance (sitting and/or standing);Decreased knowledge of precautions;Pain;Decreased activity tolerance;Decreased knowledge of use of DME or AE      OT Treatment/Interventions: Self-care/ADL training;DME and/or AE instruction;Therapeutic activities;Balance training;Patient/family education    OT Goals(Current goals can be found in the care plan section) Acute Rehab OT Goals Patient Stated Goal: decrease pain OT Goal Formulation: With patient Time For Goal Achievement: 08/17/17 Potential to Achieve Goals: Good  OT Frequency: Min 2X/week               Co-evaluation PT/OT/SLP Co-Evaluation/Treatment: Yes Reason for Co-Treatment: Complexity of the patient's impairments (multi-system involvement);For patient/therapist safety;To address functional/ADL transfers   OT goals addressed during session: ADL's and self-care;Proper use of Adaptive equipment and DME      AM-PAC PT "6 Clicks" Daily Activity     Outcome Measure Help from another person eating meals?: None Help from another person taking care of personal grooming?: A Little Help from another person toileting, which includes using toliet, bedpan, or urinal?: A Lot Help from another person bathing (including washing, rinsing, drying)?: A Lot Help  from another person to put on and taking off regular upper body clothing?: A  Little Help from another person to put on and taking off regular lower body clothing?: A Lot 6 Click Score: 16   End of Session Equipment Utilized During Treatment: Gait belt;Rolling walker;Other (comment)(L CAM boot ) Nurse Communication: Mobility status;Patient requests pain meds  Activity Tolerance: Patient limited by pain Patient left: in chair;with call bell/phone within reach  OT Visit Diagnosis: Other abnormalities of gait and mobility (R26.89);History of falling (Z91.81);Pain Pain - Right/Left: Left Pain - part of body: Leg;Ankle and joints of foot(and back )                Time: 0821-0900 OT Time Calculation (min): 39 min Charges:  OT General Charges $OT Visit: 1 Visit OT Evaluation $OT Eval Moderate Complexity: 1 Mod G-Codes:     Lou Cal, OT Pager [phone] 08/03/2017   Raymondo Band 08/03/2017, 9:35 AM

## 2017-08-03 NOTE — Plan of Care (Signed)

## 2017-08-03 NOTE — Progress Notes (Addendum)
PROGRESS NOTE  [name] UEA:[id number] DOB: 27-Feb-1966 DOA: 08/02/2017 PCP: [name], MD  HPI/Recap of past 24 hours: [name] is a 52 y.o. male past medical history significant for CKD, suicide attempt, hypertension who presents to the emergency room with lethargy.  No reason for lethargy given.  Patient denies.  Sister says patient's memory is faulty.  Both agree that patient likely does fail as he usually does.  Patient has high history of falls due to his Parkinson's.  Commonly falls or hurts himself.  Recently fell hit his head and got a black eye.  Denies any SI or HI. Imaging shows multiple fractures to the lower extremities.  Head and neck cleared by EDP.  Hospitalist consulted for admission.  08/03/2017: Patient seen and examined at his bedside.  He reports 5-6 out of 10 pain in his left ankle prior to receiving his pain medication.  Was seen by orthopedic surgery on presentation with recommendation to follow-up in 2 weeks, nonweightbearing on the left lower extremity and anticipate weightbearing in about 4 weeks.  Patient is in a fracture boot.     Assessment/Plan: Active Problems:   Bipolar disorder ([place])   Essential hypertension   Diabetes type 2, uncontrolled (H[facility])   Degenerative joint disease (DJD) of hip   Dyslipidemia   Chronic kidney disease   Fall   Dementia, early onset with advanced brain atrophy for age   Diabetic neuropathy ([place])   Closed displaced fracture of lateral malleolus of left fibula   Generalized weakness/fatigue/physical debility, unclear etiology-Not improving Possibly 2/2 to acute dehydration as he appears dry vs depression vs others Start IV fluid NS at 75cc/hr and encourage oral intake Will consult psych in the morning if no improvement Get tsh, B12, vitamin D  Left ankle fracture secondary to mechanical fall Orthopedic surgery consulted recommended to continue wearing fracture boot follow-up in 2 weeks and nonweightbearing for 4  weeks. Pain management in place Bowel regimen in place PT recommends SNF Fall precautions  AKI on CKD 3 Baseline creatinine 1.7 Creatinine on admission 2.09 Back to baseline 1.77 Avoidnephrotoxic agents/hypotension/dehydration Repeat chemistry panel in the morning  Left lower extremity laceration and cellulitis Continue Augmentin and IV vancomycin No systemic sign of infection, afebrile and no leukocytosis Monitor fever curve Repeat CBC in the morning  Ambulatory dysfunction with frequent falls PT following and recommends SNF Fall precautions in place  Chronic thrombocytopenia Platelets 75 from 87 No sign of overt bleeding Repeat CBC in the morning  Type 2 diabetes, uncontrolled due to hyperglycemia Continue insulin sliding scale Last hemoglobin A1c 8.2 from 05/27/2017  Bipolar disorder/depression/anxiety Denies suicidal ideation or homicidal ideation Continue home medications  History of seizures Continue home medications   Risks: Moderate to severe due to multiple falls resulting in bone fractures, persistent fatigue, physical debility, and acute cellulitis involving left lower extremity.   Code Status: Full code  Family Communication: None at bedside code  Disposition Plan: Home versus SNF when clinically stable   Consultants:  Orthopedic surgery  Procedures: None  Antimicrobials:  None  DVT prophylaxis: SCD, subcu Lovenox daily   Objective: Vitals:   08/02/17 1310 08/02/17 2058 08/03/17 0621 08/03/17 1000  BP: 111/78 117/70 109/83   Pulse: 82 80 89   Resp: 14     Temp: 98.1 F (36.7 C) 98.3 F (36.8 C) 98.2 F (36.8 C)   TempSrc: Oral Oral Oral   SpO2: 98% 93% 92%   Weight:    91 kg (  200 lb 9.9 oz)  Height:    6\' 4"  (1.93 m)    Intake/Output Summary (Last 24 hours) at 08/03/2017 1316 Last data filed at 08/03/2017 1100 Gross per 24 hour  Intake 480 ml  Output 1000 ml  Net -520 ml   Filed Weights   08/03/17 1000  Weight: 91 kg  (200 lb 9.9 oz)    Exam:   General: 53 year old Caucasian male well-developed well-nourished no acute distress.  Alert and oriented x3.  Cardiovascular: Regular rate and rhythm with no rubs or gallops.  No JVD or thyromegaly present.  Respiratory: Clear to auscultation with no wheezes or rales.  Abdomen: Soft nontender nondistended with normal bowel sounds x4 quadrant.  Musculoskeletal:.  Left lower extremity is in a fracture boot  Skin: No ulcerative lesions.  Psychiatry: Mood is appropriate for condition and setting.   Data Reviewed: CBC: Recent Labs  Lab 08/02/17 0346 08/02/17 0735 08/03/17 0611  WBC 7.6 7.6 5.7  NEUTROABS 5.4  --   --   HGB 13.5 12.9* 11.7*  HCT 37.6* 37.4* 33.1*  MCV 86.4 86.6 88.0  PLT 99* 87* 75*   Basic Metabolic Panel: Recent Labs  Lab 08/02/17 0346 08/02/17 0735 08/03/17 0611  NA 136 136 139  K 6.1* 5.0 3.9  CL 103 101 104  CO2 25 25 25   GLUCOSE 253* 226* 126*  BUN 22* 20 20  CREATININE 2.23* 2.09* 1.77*  CALCIUM 9.5 9.3 8.8*   GFR: Estimated Creatinine Clearance: 62 mL/min (A) (by C-G formula based on SCr of 1.77 mg/dL (H)). Liver Function Tests: Recent Labs  Lab 08/02/17 0346  AST 20  ALT 17  ALKPHOS 58  BILITOT 0.9  PROT 6.8  ALBUMIN 3.6   No results for input(s): LIPASE, AMYLASE in the last 168 hours. No results for input(s): AMMONIA in the last 168 hours. Coagulation Profile: No results for input(s): INR, PROTIME in the last 168 hours. Cardiac Enzymes: No results for input(s): CKTOTAL, CKMB, CKMBINDEX, TROPONINI in the last 168 hours. BNP (last 3 results) No results for input(s): PROBNP in the last 8760 hours. HbA1C: No results for input(s): HGBA1C in the last 72 hours. CBG: Recent Labs  Lab 08/02/17 0415  GLUCAP 224*   Lipid Profile: No results for input(s): CHOL, HDL, LDLCALC, TRIG, CHOLHDL, LDLDIRECT in the last 72 hours. Thyroid Function Tests: No results for input(s): TSH, T4TOTAL, FREET4, T3FREE,  THYROIDAB in the last 72 hours. Anemia Panel: No results for input(s): VITAMINB12, FOLATE, FERRITIN, TIBC, IRON, RETICCTPCT in the last 72 hours. Urine analysis:    Component Value Date/Time   COLORURINE YELLOW 08/02/2017 0509   APPEARANCEUR CLEAR 08/02/2017 0509   LABSPEC 1.024 08/02/2017 0509   PHURINE 6.0 08/02/2017 0509   GLUCOSEU >=500 (A) 08/02/2017 0509   GLUCOSEU >=1000 (A) 06/02/2016 0844   HGBUR SMALL (A) 08/02/2017 0509   BILIRUBINUR NEGATIVE 08/02/2017 0509   KETONESUR 20 (A) 08/02/2017 0509   PROTEINUR 30 (A) 08/02/2017 0509   UROBILINOGEN 0.2 06/02/2016 0844   NITRITE NEGATIVE 08/02/2017 0509   LEUKOCYTESUR NEGATIVE 08/02/2017 0509   Sepsis Labs: @LABRCNTIP (procalcitonin:4,lacticidven:4)  )No results found for this or any previous visit (from the past 240 hour(s)).    Studies: No results found.  Scheduled Meds: . amoxicillin-clavulanate  1 tablet Oral TID  . atorvastatin  10 mg Oral Daily  . buPROPion  300 mg Oral Daily  . dextrose  25 g Intravenous Once  . divalproex  1,500 mg Oral QHS  . enoxaparin (LOVENOX)  injection  40 mg Subcutaneous Q24H  . ibuprofen  400 mg Oral Once  . insulin aspart  10 Units Intravenous Once  . insulin glargine  25 Units Subcutaneous Q2200  . QUEtiapine  400 mg Oral QHS  . sodium chloride flush  3 mL Intravenous Q12H  . Tdap  0.5 mL Intramuscular Once  . traZODone  150 mg Oral QHS    Continuous Infusions: . calcium gluconate    . vancomycin Stopped (08/03/17 0748)     LOS: 0 days     Kayleen Memos, MD [facility] Pager [phone]  If 7PM-7AM, please contact night-coverage [email or web address] Password [email or web address] 08/03/2017, 1:16 PM

## 2017-08-03 NOTE — [place] FL2 (Signed)
[place] MEDICAID FL2 LEVEL OF CARE SCREENING TOOL     IDENTIFICATION  Patient Name: [name] Birthdate: 1965-05-15 Sex: male Admission Date (Current Location): 08/02/2017  [facility] and [place] Number:  Herbalist and Address:  [facility], [place] [address] [place], [place] [address]      Provider Number: [id number]  Attending Physician Name and Address:  Kayleen Memos, DO  Relative Name and Phone Number:  [name], sister, [phone]    Current Level of Care: Hospital Recommended Level of Care: [place] Prior Approval Number:    Date Approved/Denied:   PASRR Number: pending  Discharge Plan: Home    Current Diagnoses: Patient Active Problem List   Diagnosis Date Noted  . Closed displaced fracture of lateral malleolus of left fibula 08/02/2017  . BPH (benign prostatic hyperplasia) 07/24/2017  . Hemorrhoid 05/27/2017  . Shoulder arthritis 05/27/2017  . Diabetic neuropathy ([place]) 05/27/2017  . Degenerative disc disease, lumbar 02/04/2017  . Constipation   . History of seizures 09/20/2016  . Dementia, early onset with advanced brain atrophy for age 74/05/2016  . Acute lower UTI   . Urinary retention   . Acute metabolic encephalopathy 86/76/1950  . HLD (hyperlipidemia) 09/07/2016  . GERD (gastroesophageal reflux disease) 09/07/2016  . PTSD (post-traumatic stress disorder) 09/07/2016  . Acute encephalopathy 09/07/2016  . Fall 09/07/2016  . Chronic kidney disease   . Ataxia 10/14/2015  . Hypokalemia 10/03/2015  . Hemiparesis ([place])   . Dyslipidemia 05/31/2014  . Insomnia 11/11/2013  . Degenerative joint disease (DJD) of hip 08/16/2013  . Erectile dysfunction 02/17/2011  . Constipation - functional 02/17/2011  . Diabetes type 2, uncontrolled ([place]) 11/06/2010  . Hypogonadism male 05/20/2010  . TOBACCO USER 05/20/2010  . Demoralization and apathy 05/20/2010  . Bipolar disorder ([place]) 01/18/2010  .  Depression 01/18/2010  . Right shoulder pain 01/18/2010  . Anxiety state 12/05/2006  . Essential hypertension 12/05/2006    Orientation RESPIRATION BLADDER Height & Weight     Self, Time, Situation, Place  Normal Continent Weight: 200 lb 9.9 oz (91 kg) Height:  6\' 4"  (193 cm)  BEHAVIORAL SYMPTOMS/MOOD NEUROLOGICAL BOWEL NUTRITION STATUS      Continent Diet(See DC Summary)  AMBULATORY STATUS COMMUNICATION OF NEEDS Skin   Extensive Assist Verbally Surgical wounds                       Personal Care Assistance Level of Assistance  Dressing, Bathing, Feeding Bathing Assistance: Maximum assistance Feeding assistance: Limited assistance Dressing Assistance: Maximum assistance     Functional Limitations Info  Sight, Hearing, Speech Sight Info: Adequate Hearing Info: Adequate Speech Info: Adequate    SPECIAL CARE FACTORS FREQUENCY  PT (By licensed PT), OT (By licensed OT)     PT Frequency: 5x week OT Frequency: 5x week            Contractures      Additional Factors Info  Code Status, Allergies, Psychotropic, Insulin Sliding Scale Code Status Info: Full Allergies Info: VICODIN HYDROCODONE-ACETAMINOPHEN  Psychotropic Info: Seroquel and Wellbutrin, Depakote Insulin Sliding Scale Info: Lantus daily injection       Current Medications (08/03/2017):  This is the current hospital active medication list Current Facility-Administered Medications  Medication Dose Route Frequency Provider Last Rate Last Dose  . 0.9 %  sodium chloride infusion   Intravenous Continuous Kayleen Memos, DO 75 mL/hr at 08/03/17 1641    . acetaminophen (TYLENOL)  tablet 650 mg  650 mg Oral Q6H PRN Elwin Mocha, MD   650 mg at 08/03/17 0949  . amoxicillin-clavulanate (AUGMENTIN) 500-125 MG per tablet 500 mg  1 tablet Oral TID Thurnell Lose, MD   500 mg at 08/03/17 1559  . atorvastatin (LIPITOR) tablet 10 mg  10 mg Oral Daily Elwin Mocha, MD   10 mg at 08/03/17 0949  . buPROPion  (WELLBUTRIN XL) 24 hr tablet 300 mg  300 mg Oral Daily Elwin Mocha, MD   300 mg at 08/03/17 0949  . calcium gluconate 2 g in sodium chloride 0.9 % 100 mL IVPB  2 g Intravenous Once Elwin Mocha, MD      . dextrose 50 % solution 25 g  25 g Intravenous Once Elwin Mocha, MD      . divalproex (DEPAKOTE) DR tablet 1,500 mg  1,500 mg Oral QHS Elwin Mocha, MD   1,500 mg at 08/02/17 2149  . enoxaparin (LOVENOX) injection 40 mg  40 mg Subcutaneous Q24H Elwin Mocha, MD   40 mg at 08/03/17 0949  . ibuprofen (ADVIL,MOTRIN) 100 MG/5ML suspension 400 mg  400 mg Oral Once Blount, Scarlette Shorts T, NP      . insulin glargine (LANTUS) injection 25 Units  25 Units Subcutaneous Q2200 Elwin Mocha, MD   25 Units at 08/02/17 2150  . ondansetron (ZOFRAN) injection 4 mg  4 mg Intravenous Q6H PRN Elwin Mocha, MD      . QUEtiapine (SEROQUEL) tablet 400 mg  400 mg Oral QHS Elwin Mocha, MD   400 mg at 08/02/17 2150  . sodium chloride flush (NS) 0.9 % injection 3 mL  3 mL Intravenous Q12H Elwin Mocha, MD   3 mL at 08/03/17 0930  . Tdap (BOOSTRIX) injection 0.5 mL  0.5 mL Intramuscular Once Montine Circle, PA-C      . traZODone (DESYREL) tablet 150 mg  150 mg Oral QHS Elwin Mocha, MD   150 mg at 08/02/17 2150  . vancomycin (VANCOCIN) IVPB 750 mg/150 ml premix  750 mg Intravenous Q12H Erenest Blank, RPH 150 mL/hr at 08/03/17 1700 750 mg at 08/03/17 1700     Discharge Medications: Please see discharge summary for a list of discharge medications.  Relevant Imaging Results:  Relevant Lab Results:   Additional Information SS#: [id number]  Normajean Baxter, LCSW

## 2017-08-04 ENCOUNTER — Other Ambulatory Visit: Payer: Self-pay

## 2017-08-04 ENCOUNTER — Encounter (HOSPITAL_COMMUNITY): Payer: Self-pay | Admitting: General Practice

## 2017-08-04 DIAGNOSIS — M160 Bilateral primary osteoarthritis of hip: Secondary | ICD-10-CM

## 2017-08-04 DIAGNOSIS — F3175 Bipolar disorder, in partial remission, most recent episode depressed: Secondary | ICD-10-CM

## 2017-08-04 DIAGNOSIS — S8262XA Displaced fracture of lateral malleolus of left fibula, initial encounter for closed fracture: Secondary | ICD-10-CM

## 2017-08-04 LAB — VITAMIN B12: VITAMIN B 12: 530 pg/mL (ref 180–914)

## 2017-08-04 LAB — GLUCOSE, CAPILLARY: Glucose-Capillary: 142 mg/dL — ABNORMAL HIGH (ref 65–99)

## 2017-08-04 LAB — IRON AND TIBC
IRON: 65 ug/dL (ref 45–182)
SATURATION RATIOS: 25 % (ref 17.9–39.5)
TIBC: 263 ug/dL (ref 250–450)
UIBC: 198 ug/dL

## 2017-08-04 LAB — BASIC METABOLIC PANEL
ANION GAP: 8 (ref 5–15)
BUN: 24 mg/dL — ABNORMAL HIGH (ref 6–20)
CALCIUM: 9.1 mg/dL (ref 8.9–10.3)
CHLORIDE: 105 mmol/L (ref 101–111)
CO2: 26 mmol/L (ref 22–32)
Creatinine, Ser: 1.69 mg/dL — ABNORMAL HIGH (ref 0.61–1.24)
GFR calc non Af Amer: 46 mL/min — ABNORMAL LOW (ref >60)
GFR, EST AFRICAN AMERICAN: 53 mL/min — AB (ref >60)
Glucose, Bld: 139 mg/dL — ABNORMAL HIGH (ref 65–99)
Potassium: 3.8 mmol/L (ref 3.5–5.1)
Sodium: 139 mmol/L (ref 135–145)

## 2017-08-04 LAB — TSH: TSH: 2.108 u[IU]/mL (ref 0.350–4.500)

## 2017-08-04 LAB — CBC
HEMATOCRIT: 31.4 % — AB (ref 39.0–52.0)
HEMOGLOBIN: 10.8 g/dL — AB (ref 13.0–17.0)
MCH: 29.5 pg (ref 26.0–34.0)
MCHC: 34.4 g/dL (ref 30.0–36.0)
MCV: 85.8 fL (ref 78.0–100.0)
Platelets: 86 10*3/uL — ABNORMAL LOW (ref 150–400)
RBC: 3.66 MIL/uL — ABNORMAL LOW (ref 4.22–5.81)
RDW: 12.4 % (ref 11.5–15.5)
WBC: 4.7 10*3/uL (ref 4.0–10.5)

## 2017-08-04 LAB — FERRITIN: Ferritin: 468 ng/mL — ABNORMAL HIGH (ref 24–336)

## 2017-08-04 MED ORDER — ENSURE ENLIVE PO LIQD
237.0000 mL | Freq: Two times a day (BID) | ORAL | Status: DC
  Administered 2017-08-05 – 2017-08-11 (×14): 237 mL via ORAL

## 2017-08-04 MED ORDER — TRAMADOL HCL 50 MG PO TABS
50.0000 mg | ORAL_TABLET | Freq: Four times a day (QID) | ORAL | Status: DC | PRN
  Administered 2017-08-04 – 2017-08-11 (×24): 50 mg via ORAL
  Filled 2017-08-04 (×24): qty 1

## 2017-08-04 NOTE — Clinical Social Work Note (Signed)
Clinical Social Work Assessment  Patient Details  Name: [name] MRN: [id number] Date of Birth: 05-Mar-1966  Date of referral:  08/04/17               Reason for consult:  Facility Placement                Permission sought to share information with:  Facility Art therapist granted to share information::  Yes, Release of Information Signed  Name::     [name]  Agency::  SNF  Relationship::  sister  Contact Information:     Housing/Transportation Living arrangements for the past 2 months:  Single Family Home Source of Information:  Patient Patient Interpreter Needed:  None Criminal Activity/Legal Involvement Pertinent to Current Situation/Hospitalization:  No - Comment as needed Significant Relationships:  Siblings, Other Family Members Lives with:  Siblings Do you feel safe going back to the place where you live?  No Need for family participation in patient care:  No (Coment)  Care giving concerns:  Pt has new impairment and will need rehabilitative therapies at discharge.  Social Worker assessment / plan:  CSW met with patient at bedside to discuss SNF recommendation. Pt indicated that he resides with sister and brother in law prior to hospitalization. He indicated that he had the same issue about a year ago and is concerned that he has the same issue again. CSW validated. Pt indicated that [facility] . CSW explained her role and the Insurance barriers with medicaid. CSW then wanted to explore his last psych admission/mental health history as the SNF's requested more information before offering a SNF bed. Pt indicated that he did not remember and gave CSW permission to call his sister [name] to discuss. Pt confirmed that he has a long psych history and sees psychiatrist and therapist regularly. Pt endorses compliance with psychotropic meds. Pt agreeable to SNF at discharge.  CSW will  assist with disposition   Employment status:  Disabled (Comment on whether or not currently receiving Disability) Insurance information:  Medicaid [place] PT Recommendations:  [place] / Referral to community resources:  [place]  Patient/Family's Response to care:  Patient agreeable to SNF at discharge. No issues or concerns. Pt thanked CSW for meeting and following up.  Patient/Family's Understanding of and Emotional Response to Diagnosis, Current Treatment, and Prognosis:  Patient understands impairment as he has been to SNF about a year ago for the same issue and is agreeable to SNF again at discharge. CSW explained SNF process as well as barriers as he has medicaid and may not get many offers. Pt agreeable and hopes to go to rehab and return home with family soon. CSW will f/u for disposition.  Emotional Assessment Appearance:  Appears stated age Attitude/Demeanor/Rapport:  (Cooperative) Affect (typically observed):  Accepting, Appropriate Orientation:  Oriented to Self, Oriented to Place, Oriented to  Time, Oriented to Situation Alcohol / Substance use:  Not Applicable Psych involvement (Current and /or in the community):  No (Comment)  Discharge Needs  Concerns to be addressed:  Discharge Planning Concerns Readmission within the last 30 days:  No Current discharge risk:  Physical Impairment, Dependent with Mobility Barriers to Discharge:  No Barriers Identified   Normajean Baxter, LCSW 08/04/2017, 1:32 PM

## 2017-08-04 NOTE — Progress Notes (Signed)
PROGRESS NOTE  [name] BHA:[id number] DOB: 11/26/1965 DOA: 08/02/2017 PCP: [name], MD  HPI/Recap of past 24 hours: [name] is a 52 y.o. male past medical history significant for CKD, suicide attempt, hypertension who presents to the emergency room with lethargy.  No reason for lethargy given.  Patient denies.  Sister says patient's memory is faulty.  Both agree that patient likely does fail as he usually does.  Patient has high history of falls due to his Parkinson's.  Commonly falls or hurts himself.  Recently fell hit his head and got a black eye.  Denies any SI or HI. Imaging shows multiple fractures to the lower extremities.  Head and neck cleared by EDP.  Hospitalist for admission.  08/03/2017: Patient seen and examined at his bedside.  He reports 5-6 out of 10 pain in his left ankle prior to receiving his pain medication.  Was seen by orthopedic surgery on presentation with recommendation to follow-up in 2 weeks, nonweightbearing on the left lower extremity and anticipate weightbearing in about 4 weeks.  Patient is in a fracture boot.   08/04/2017 patient seen and examined at his bedside.  He reports feeling chronically depressed.  Denies suicidal ideation or homicidal ideation.    Assessment/Plan: Active Problems:   Bipolar disorder ([place])   Essential hypertension   Diabetes type 2, uncontrolled ([facility])   Degenerative joint disease (DJD) of hip   Dyslipidemia   Chronic kidney disease   Fall   Dementia, early onset with advanced brain atrophy for age   Diabetic neuropathy ([place])   Closed displaced fracture of lateral malleolus of left fibula   Generalized weakness/fatigue/physical debility, unclear etiology-Not improving Possibly 2/2 to acute dehydration as he appears dry vs depression vs others Continue IV fluid NS at 75cc/hr and encourage oral intake TSH and vitamin B12 normal Psych consulted  Right plantar region fifth toe laceration secondary to  fall Orthopedic surgery consulted, Dr. Marlou Sa.  Highly appreciated. Continue IV antibiotics  Left ankle fracture secondary to mechanical fall Orthopedic surgery consulted recommended to continue wearing fracture boot follow-up in 2 weeks and nonweightbearing for 4 weeks. Pain management in place Bowel regimen in place PT recommends SNF Fall precautions  AKI on CKD 3, improving Back to baseline at 1.6 Creatinine on admission 2.09 Avoidnephrotoxic agents/hypotension/dehydration  Left dorsal foot cellulitis Continue Augmentin and IV vancomycin No systemic sign of infection, afebrile and no leukocytosis Monitor fever curve No leukocytosis  Ambulatory dysfunction with frequent falls, unclear etiology PT following and recommends SNF Fall precautions in place  Chronic thrombocytopenia, stable Platelet 86 from 75 No sign of overt bleeding  Type 2 diabetes, uncontrolled due to hyperglycemia Continue insulin sliding scale Last hemoglobin A1c 8.2 from 05/27/2017  Bipolar disorder/depression/anxiety Denies suicidal ideation or homicidal ideation Continue home medications  History of seizures Continue home medications   Risks: Moderate to severe due to multiple falls resulting in bone fractures, persistent fatigue, physical debility, and acute cellulitis involving left lower extremity.   Code Status: Full code  Family Communication: None at bedside code  Disposition Plan: Home versus SNF when clinically stable   Consultants:  Orthopedic surgery, Dr. Marlou Sa on 08/04/2017.  Called the answering service.  Left patient's room number and name and provider cell phone number to call about consult.  Procedures: None  Antimicrobials:  IV vancomycin and p.o. Augmentin  DVT prophylaxis: SCD, subcu Lovenox daily   Objective: Vitals:   08/03/17 2057 08/04/17 0607 08/04/17 0954 08/04/17 1406  BP:  114/81 109/83 114/75 118/65  Pulse: 81 75 72 76  Resp: 13 16 14 16   Temp: 98.6 F  (37 C) 98.1 F (36.7 C) 98.4 F (36.9 C) 98.8 F (37.1 C)  TempSrc: Oral Oral Oral Oral  SpO2: 98% 96% 96% 98%  Weight:      Height:        Intake/Output Summary (Last 24 hours) at 08/04/2017 1534 Last data filed at 08/04/2017 1422 Gross per 24 hour  Intake 315 ml  Output 1050 ml  Net -735 ml   Filed Weights   08/03/17 1000  Weight: 91 kg (200 lb 9.9 oz)    Exam: 08/04/2017   General: 52 year old Caucasian male well-developed well-nourished in no acute distress.  Alert oriented x3.  Cardiovascular: Regular rate and rhythm with no rubs or gallops.  No JVD or thyromegaly present.  Respiratory: Clear to auscultation with no wheezes or rales.  Abdomen: Soft nontender nondistended with normal bowel sounds x4 quadrant.  Musculoskeletal:.  Left lower extremity is in a fracture boot  Skin: No ulcerative lesions.  Psychiatry: Mood is appropriate for condition and setting.   Data Reviewed: CBC: Recent Labs  Lab 08/02/17 0346 08/02/17 0735 08/03/17 0611 08/04/17 0611  WBC 7.6 7.6 5.7 4.7  NEUTROABS 5.4  --   --   --   HGB 13.5 12.9* 11.7* 10.8*  HCT 37.6* 37.4* 33.1* 31.4*  MCV 86.4 86.6 88.0 85.8  PLT 99* 87* 75* 86*   Basic Metabolic Panel: Recent Labs  Lab 08/02/17 0346 08/02/17 0735 08/03/17 0611 08/04/17 0611  NA 136 136 139 139  K 6.1* 5.0 3.9 3.8  CL 103 101 104 105  CO2 25 25 25 26   GLUCOSE 253* 226* 126* 139*  BUN 22* 20 20 24*  CREATININE 2.23* 2.09* 1.77* 1.69*  CALCIUM 9.5 9.3 8.8* 9.1   GFR: Estimated Creatinine Clearance: 64.9 mL/min (A) (by C-G formula based on SCr of 1.69 mg/dL (H)). Liver Function Tests: Recent Labs  Lab 08/02/17 0346  AST 20  ALT 17  ALKPHOS 58  BILITOT 0.9  PROT 6.8  ALBUMIN 3.6   No results for input(s): LIPASE, AMYLASE in the last 168 hours. No results for input(s): AMMONIA in the last 168 hours. Coagulation Profile: No results for input(s): INR, PROTIME in the last 168 hours. Cardiac Enzymes: No results  for input(s): CKTOTAL, CKMB, CKMBINDEX, TROPONINI in the last 168 hours. BNP (last 3 results) No results for input(s): PROBNP in the last 8760 hours. HbA1C: No results for input(s): HGBA1C in the last 72 hours. CBG: Recent Labs  Lab 08/02/17 0415  GLUCAP 224*   Lipid Profile: No results for input(s): CHOL, HDL, LDLCALC, TRIG, CHOLHDL, LDLDIRECT in the last 72 hours. Thyroid Function Tests: Recent Labs    08/04/17 0611  TSH 2.108   Anemia Panel: Recent Labs    08/04/17 0611  VITAMINB12 530  FERRITIN 468*  TIBC 263  IRON 65   Urine analysis:    Component Value Date/Time   COLORURINE YELLOW 08/02/2017 0509   APPEARANCEUR CLEAR 08/02/2017 0509   LABSPEC 1.024 08/02/2017 0509   PHURINE 6.0 08/02/2017 0509   GLUCOSEU >=500 (A) 08/02/2017 0509   GLUCOSEU >=1000 (A) 06/02/2016 0844   HGBUR SMALL (A) 08/02/2017 0509   BILIRUBINUR NEGATIVE 08/02/2017 0509   KETONESUR 20 (A) 08/02/2017 0509   PROTEINUR 30 (A) 08/02/2017 0509   UROBILINOGEN 0.2 06/02/2016 0844   NITRITE NEGATIVE 08/02/2017 0509   LEUKOCYTESUR NEGATIVE 08/02/2017 0509   Sepsis  Labs: @LABRCNTIP (procalcitonin:4,lacticidven:4)  )No results found for this or any previous visit (from the past 240 hour(s)).    Studies: No results found.  Scheduled Meds: . amoxicillin-clavulanate  1 tablet Oral TID  . atorvastatin  10 mg Oral Daily  . buPROPion  300 mg Oral Daily  . dextrose  25 g Intravenous Once  . divalproex  1,500 mg Oral QHS  . enoxaparin (LOVENOX) injection  40 mg Subcutaneous Q24H  . ibuprofen  400 mg Oral Once  . insulin glargine  25 Units Subcutaneous Q2200  . QUEtiapine  400 mg Oral QHS  . sodium chloride flush  3 mL Intravenous Q12H  . Tdap  0.5 mL Intramuscular Once  . traZODone  150 mg Oral QHS    Continuous Infusions: . sodium chloride 75 mL/hr at 08/04/17 1409  . calcium gluconate    . vancomycin Stopped (08/04/17 0705)     LOS: 1 day     Kayleen Memos, MD [facility] Pager [phone]  If 7PM-7AM, please contact night-coverage [email or web address] Password [facility] 08/04/2017, 3:34 PM

## 2017-08-05 DIAGNOSIS — W19XXXA Unspecified fall, initial encounter: Secondary | ICD-10-CM

## 2017-08-05 DIAGNOSIS — F339 Major depressive disorder, recurrent, unspecified: Secondary | ICD-10-CM

## 2017-08-05 DIAGNOSIS — F250 Schizoaffective disorder, bipolar type: Secondary | ICD-10-CM

## 2017-08-05 DIAGNOSIS — Z87891 Personal history of nicotine dependence: Secondary | ICD-10-CM

## 2017-08-05 LAB — GLUCOSE, CAPILLARY
GLUCOSE-CAPILLARY: 101 mg/dL — AB (ref 65–99)
Glucose-Capillary: 115 mg/dL — ABNORMAL HIGH (ref 65–99)

## 2017-08-05 LAB — VITAMIN D 25 HYDROXY (VIT D DEFICIENCY, FRACTURES): VIT D 25 HYDROXY: 48.1 ng/mL (ref 30.0–100.0)

## 2017-08-05 MED ORDER — DOXYCYCLINE HYCLATE 100 MG PO TABS
100.0000 mg | ORAL_TABLET | Freq: Two times a day (BID) | ORAL | Status: DC
  Administered 2017-08-05 – 2017-08-11 (×12): 100 mg via ORAL
  Filled 2017-08-05 (×12): qty 1

## 2017-08-05 NOTE — Consult Note (Signed)
[facility] Face-to-Face Psychiatry Consult   Reason for Consult:  Increase in depression Referring Physician:  Primary MD Patient Identification: [name] MRN:  [id number] Principal Diagnosis: schizoaffective disorder, bipolar type Diagnosis:   Patient Active Problem List   Diagnosis Date Noted  . Schizoaffective disorder, bipolar type ([place]) [F25.0] 01/18/2010    Priority: High  . Closed displaced fracture of lateral malleolus of left fibula [S82.62XA] 08/02/2017  . BPH (benign prostatic hyperplasia) [N40.0] 07/24/2017  . Hemorrhoid [K64.9] 05/27/2017  . Shoulder arthritis [M19.019] 05/27/2017  . Diabetic neuropathy ([place]) [E11.40] 05/27/2017  . Degenerative disc disease, lumbar [M51.36] 02/04/2017  . Constipation [K59.00]   . History of seizures [Z87.898] 09/20/2016  . Dementia, early onset with advanced brain atrophy for age [F24.90] 09/20/2016  . Acute lower UTI [N39.0]   . Urinary retention [R33.9]   . Acute metabolic encephalopathy [G26.94] 09/07/2016  . HLD (hyperlipidemia) [E78.5] 09/07/2016  . GERD (gastroesophageal reflux disease) [K21.9] 09/07/2016  . PTSD (post-traumatic stress disorder) [F43.10] 09/07/2016  . Acute encephalopathy [G93.40] 09/07/2016  . Fall [W19.XXXA] 09/07/2016  . Chronic kidney disease [N18.9]   . Ataxia [R27.0] 10/14/2015  . Hypokalemia [E87.6] 10/03/2015  . Hemiparesis ([place]) [G81.90]   . Dyslipidemia [E78.5] 05/31/2014  . Insomnia [G47.00] 11/11/2013  . Degenerative joint disease (DJD) of hip [M16.9] 08/16/2013  . Erectile dysfunction [N52.9] 02/17/2011  . Constipation - functional [K59.04] 02/17/2011  . Diabetes type 2, uncontrolled ([place]) [E11.65] 11/06/2010  . Hypogonadism male [E29.1] 05/20/2010  . TOBACCO USER [F17.200] 05/20/2010  . Demoralization and apathy [R45.3] 05/20/2010  . Right shoulder pain [M25.511] 01/18/2010  . Anxiety state [F41.1] 12/05/2006  . Essential hypertension [I10] 12/05/2006    Total Time spent with patient: 45  minutes  Subjective:   [name] is a 52 y.o. male patient reports an increase in depression recently related to his discontinuation of his testerone due to his labs.  This will be re-evaluated at his May 7 appointment.  Denies suicidal/homicidal ideations, hallucinations, and anxiety.  Patient at [facility], supportive family.  Consider adding citalopram 10 mg daily for depression, checking a Depakote level, and continue his other psychiatric medications.  Dr. Dwyane Dee reviewed this patient and concurs with the plan.  HPI:  52 yo male who engages easily in conversation, depressed mood lately due to the removal of his testerone.  He is not concerned about this as he has an appointment on May 7 to be reconsidered for it.  Discussed adjusting his medications to assist during this time.  Supportive family.    Past Psychiatric History: schizoaffective disorder, bipolar type (patient report)  Risk to Self: Is patient at risk for suicide?: No Risk to Others:  no Prior Inpatient Therapy:  none Prior Outpatient Therapy:  [facility]  Past Medical History:  Past Medical History:  Diagnosis Date  . ADD (attention deficit disorder)   . Anxiety   . Arthritis    right hip  . Bipolar 1 disorder ([place])   . Blood in urine   . CKD (chronic kidney disease), stage III ([place])   . Creatinine elevation   . Dementia    "early onset" (08/04/2017)  . Depression    bipolar [facility]  . Diabetes mellitus without complication ([place])   . Family history of anesthesia complication    pt is unsure , but pt father may have been difficult to arouse   . HCAP (healthcare-associated pneumonia) 10/31/2012  . History of kidney stones   . Hypertension   . Hypogonadism  male   . Liver fatty degeneration   . Microscopic hematuria    hereditary s/p Urology eval  . Osteoarthritis of right hip 11/28/2011   2012 2015 s/p THR Severe  Dr Novella Olive    . Pleural effusion 11/02/2012  . Pneumonia 10-2012  . Pneumonia, organism  unspecified(486) 11/02/2012  . Polysubstance dependence, non-opioid, in remission ([place])    remote  . Primary osteoarthritis of left hip 05/22/2015  . PTSD (post-traumatic stress disorder)    SOCIAL ANXIETY DISORDER   . Suicide attempt by multiple drug overdose ([place]) 01-02-16   Grieving his cat's death 2015/06/22    Past Surgical History:  Procedure Laterality Date  . BACK SURGERY    . CLOSED REDUCTION METACARPAL WITH PERCUTANEOUS PINNING Right   . LUMBAR DISC SURGERY    . TONSILLECTOMY    . TOTAL HIP ARTHROPLASTY Right 08/16/2013   Procedure: TOTAL HIP ARTHROPLASTY ANTERIOR APPROACH;  Surgeon: Hessie Dibble, MD;  Location: [place];  Service: Orthopedics;  Laterality: Right;  . TOTAL HIP ARTHROPLASTY Left 05/22/2015   Procedure: TOTAL HIP ARTHROPLASTY ANTERIOR APPROACH;  Surgeon: Melrose Nakayama, MD;  Location: [place];  Service: Orthopedics;  Laterality: Left;   Family History:  Family History  Problem Relation Age of Onset  . Diabetes Father   . Cancer Mother        died of melanoma with mets  . Cervical cancer Sister   . Diabetes Sister   . Other Neg Hx        hypogonadism   Family Psychiatric  History: none Social History:  Social History   Substance and Sexual Activity  Alcohol Use No     Social History   Substance and Sexual Activity  Drug Use No   Comment: hx of marijuana/cocaine/crack use but sober since Jun 21, 2022    Social History   Socioeconomic History  . Marital status: Single    Spouse name: Not on file  . Number of children: Not on file  . Years of education: Not on file  . Highest education level: Not on file  Occupational History  . Occupation: disability  Social Needs  . Financial resource strain: Not on file  . Food insecurity:    Worry: Not on file    Inability: Not on file  . Transportation needs:    Medical: Not on file    Non-medical: Not on file  Tobacco Use  . Smoking status: Former Smoker    Years: 0.00    Types: Cigarettes    Last attempt to  quit: 02/19/2014    Years since quitting: 3.4  . Smokeless tobacco: Never Used  Substance and Sexual Activity  . Alcohol use: No  . Drug use: No    Comment: hx of marijuana/cocaine/crack use but sober since 2022/06/21  . Sexual activity: Not Currently  Lifestyle  . Physical activity:    Days per week: Not on file    Minutes per session: Not on file  . Stress: Not on file  Relationships  . Social connections:    Talks on phone: Not on file    Gets together: Not on file    Attends religious service: Not on file    Active member of club or organization: Not on file    Attends meetings of clubs or organizations: Not on file    Relationship status: Not on file  Other Topics Concern  . Not on file  Social History Narrative   regualar exercise-no   Additional Social History:  Allergies:   Allergies  Allergen Reactions  . Vicodin [Hydrocodone-Acetaminophen] Itching    Labs:  Results for orders placed or performed during the hospital encounter of 08/02/17 (from the past 48 hour(s))  CBC     Status: Abnormal   Collection Time: 08/04/17  6:11 AM  Result Value Ref Range   WBC 4.7 4.0 - 10.5 K/uL   RBC 3.66 (L) 4.22 - 5.81 MIL/uL   Hemoglobin 10.8 (L) 13.0 - 17.0 g/dL   HCT 31.4 (L) 39.0 - 52.0 %   MCV 85.8 78.0 - 100.0 fL   MCH 29.5 26.0 - 34.0 pg   MCHC 34.4 30.0 - 36.0 g/dL   RDW 12.4 11.5 - 15.5 %   Platelets 86 (L) 150 - 400 K/uL    Comment: CONSISTENT WITH PREVIOUS RESULT Performed at [facility], [place] [address], [place], [place] [address]   Basic metabolic panel     Status: Abnormal   Collection Time: 08/04/17  6:11 AM  Result Value Ref Range   Sodium 139 135 - 145 mmol/L   Potassium 3.8 3.5 - 5.1 mmol/L   Chloride 105 101 - 111 mmol/L   CO2 26 22 - 32 mmol/L   Glucose, Bld 139 (H) 65 - 99 mg/dL   BUN 24 (H) 6 - 20 mg/dL   Creatinine, Ser 1.69 (H) 0.61 - 1.24 mg/dL   Calcium 9.1 8.9 - 10.3 mg/dL   GFR calc non Af Amer 46 (L) >60 mL/min   GFR calc Af Amer 53  (L) >60 mL/min    Comment: (NOTE) The eGFR has been calculated using the CKD EPI equation. This calculation has not been validated in all clinical situations. eGFR's persistently <60 mL/min signify possible Chronic Kidney Disease.    Anion gap 8 5 - 15    Comment: Performed at [facility] [address], [place], [place] [address]  TSH     Status: None   Collection Time: 08/04/17  6:11 AM  Result Value Ref Range   TSH 2.108 0.350 - 4.500 uIU/mL    Comment: Performed by a 3rd Generation assay with a functional sensitivity of <=0.01 uIU/mL. Performed at [facility], [place] [address], [place], [place] [address]   VITAMIN D 25 Hydroxy (Vit-D Deficiency, Fractures)     Status: None   Collection Time: 08/04/17  6:11 AM  Result Value Ref Range   Vit D, 25-Hydroxy 48.1 30.0 - 100.0 ng/mL    Comment: (NOTE) Vitamin D deficiency has been defined by the [place] practice guideline as a level of serum 25-OH vitamin D less than 20 ng/mL (1,2). The Endocrine Society went on to further define vitamin D insufficiency as a level between 21 and 29 ng/mL (2). 1. IOM (Institute of Medicine). 2010. Dietary reference   intakes for calcium and D. [place]: The   [employer]. 2. Holick MF, Binkley [place], Bischoff-Ferrari HA, et al.   Evaluation, treatment, and prevention of vitamin D   deficiency: an Endocrine Society clinical practice   guideline. JCEM. 2011 Jul; 96(7):1911-30. Performed At: [facility] [place], [place] [id number] Rush Farmer MD NP:[id number] Performed at [facility], [place] [address] [place], [place] [address]   Vitamin B12     Status: None   Collection Time: 08/04/17  6:11 AM  Result Value Ref Range   Vitamin B-12 530 180 - 914 pg/mL    Comment: (NOTE)  This assay is not validated for testing neonatal or myeloproliferative syndrome specimens for Vitamin B12 levels. Performed at  [facility], [place] [address] [place], [place] [address]   Iron and TIBC     Status: None   Collection Time: 08/04/17  6:11 AM  Result Value Ref Range   Iron 65 45 - 182 ug/dL   TIBC 263 250 - 450 ug/dL   Saturation Ratios 25 17.9 - 39.5 %   UIBC 198 ug/dL    Comment: Performed at [facility], [place] [address], [place], [place] [address]  Ferritin     Status: Abnormal   Collection Time: 08/04/17  6:11 AM  Result Value Ref Range   Ferritin 468 (H) 24 - 336 ng/mL    Comment: Performed at [facility], [place] [address], [place], [place] [address]  Glucose, capillary     Status: Abnormal   Collection Time: 08/04/17  9:41 PM  Result Value Ref Range   Glucose-Capillary 142 (H) 65 - 99 mg/dL  Glucose, capillary     Status: Abnormal   Collection Time: 08/05/17  6:10 AM  Result Value Ref Range   Glucose-Capillary 101 (H) 65 - 99 mg/dL    Current Facility-Administered Medications  Medication Dose Route Frequency Provider Last Rate Last Dose  . 0.9 %  sodium chloride infusion   Intravenous Continuous Kayleen Memos, DO 75 mL/hr at 08/05/17 0549    . acetaminophen (TYLENOL) tablet 650 mg  650 mg Oral Q6H PRN Elwin Mocha, MD   650 mg at 08/04/17 2224  . amoxicillin-clavulanate (AUGMENTIN) 500-125 MG per tablet 500 mg  1 tablet Oral TID Thurnell Lose, MD   500 mg at 08/05/17 1019  . atorvastatin (LIPITOR) tablet 10 mg  10 mg Oral Daily Elwin Mocha, MD   10 mg at 08/05/17 1019  . buPROPion (WELLBUTRIN XL) 24 hr tablet 300 mg  300 mg Oral Daily Elwin Mocha, MD   300 mg at 08/05/17 1019  . calcium gluconate 2 g in sodium chloride 0.9 % 100 mL IVPB  2 g Intravenous Once Elwin Mocha, MD      . dextrose 50 % solution 25 g  25 g Intravenous Once Elwin Mocha, MD      . divalproex (DEPAKOTE) DR tablet 1,500 mg  1,500 mg Oral QHS Elwin Mocha, MD   1,500 mg at 08/04/17 2224  . enoxaparin (LOVENOX) injection 40 mg  40 mg Subcutaneous Q24H Elwin Mocha,  MD   40 mg at 08/05/17 1019  . feeding supplement (ENSURE ENLIVE) (ENSURE ENLIVE) liquid 237 mL  237 mL Oral BID BM Hall, Carole N, DO   237 mL at 08/05/17 1019  . ibuprofen (ADVIL,MOTRIN) 100 MG/5ML suspension 400 mg  400 mg Oral Once Blount, Scarlette Shorts T, NP      . insulin glargine (LANTUS) injection 25 Units  25 Units Subcutaneous Q2200 Elwin Mocha, MD   25 Units at 08/04/17 2224  . ondansetron (ZOFRAN) injection 4 mg  4 mg Intravenous Q6H PRN Elwin Mocha, MD      . QUEtiapine (SEROQUEL) tablet 400 mg  400 mg Oral QHS Elwin Mocha, MD   400 mg at 08/04/17 2223  . sodium chloride flush (NS) 0.9 % injection 3 mL  3 mL Intravenous Q12H Elwin Mocha, MD   3 mL at 08/05/17 1020  . Tdap (BOOSTRIX) injection 0.5 mL  0.5 mL Intramuscular  Once Montine Circle, PA-C      . traMADol Veatrice Bourbon) tablet 50 mg  50 mg Oral Q6H PRN Irene Pap N, DO   50 mg at 08/05/17 1150  . traZODone (DESYREL) tablet 150 mg  150 mg Oral QHS Elwin Mocha, MD   150 mg at 08/04/17 2224  . vancomycin (VANCOCIN) IVPB 750 mg/150 ml premix  750 mg Intravenous Q12H Erenest Blank, [facility]   Stopped at 08/05/17 7847    Musculoskeletal: Strength & Muscle Tone: within normal limits Gait & Station: unsteady Patient leans: N/A  Psychiatric Specialty Exam: Physical Exam  Constitutional: He is oriented to person, place, and time. He appears well-developed and well-nourished.  HENT:  Head: Normocephalic.  Neck: Normal range of motion.  Respiratory: Effort normal.  Musculoskeletal: Normal range of motion.  Neurological: He is alert and oriented to person, place, and time.  Psychiatric: His speech is normal and behavior is normal. Judgment and thought content normal. Cognition and memory are normal. He exhibits a depressed mood.    Review of Systems  Psychiatric/Behavioral: Positive for depression.  All other systems reviewed and are negative.   Blood pressure 106/68, pulse 61, temperature 98.3 F (36.8 C),  temperature source Oral, resp. rate 16, height 6' 4"  (1.93 m), weight 91 kg (200 lb 9.9 oz), SpO2 96 %.Body mass index is 24.42 kg/m.  General Appearance: Casual  Eye Contact:  Good  Speech:  Clear and Coherent  Volume:  Normal  Mood:  Depressed  Affect:  Congruent  Thought Process:  Coherent and Descriptions of Associations: Intact  Orientation:  Full (Time, Place, and Person)  Thought Content:  WDL and Logical  Suicidal Thoughts:  No  Homicidal Thoughts:  No  Memory:  Immediate;   Good Recent;   Good Remote;   Good  Judgement:  Good  Insight:  Good  Psychomotor Activity:  Normal  Concentration:  Concentration: Good and Attention Span: Good  Recall:  Good  Fund of Knowledge:  Good  Language:  Good  Akathisia:  No  Handed:  Right  AIMS (if indicated):     Assets:  Housing Leisure Time Resilience Social Support  ADL's:  Intact  Cognition:  WNL  Sleep:        Treatment Plan Summary: Daily contact with patient to assess and evaluate symptoms and progress in treatment, Medication management and Plan schizoaffective disorder, bipolar type:  -Continue Depakote 1500 mg daily for mood stabilization, order a Depakote level -Continue Trazodone 150 mg at bedtime for sleep -Continue Seroquel 400 mg daily for sleep and mood stabilization -Start Celexa 10 mg daily for depression -order a Depakote level  Disposition: No evidence of imminent risk to self or others at present.    Waylan Boga, NP 08/05/2017 1:51 PM

## 2017-08-05 NOTE — Progress Notes (Signed)
Physical Therapy Treatment Patient Details Name: [name] MRN: [id number] DOB: 03/02/1966 Today's Date: 08/05/2017    History of Present Illness DEMERE DOTZLER is a 52 y.o. male past medical history significant for CKD, suicide attempt, hypertension, peripheral neuropathy, ?early onset dementia vs Parkinson's, bil THR, back surgeries who presents to the emergency room with lethargy.  No reason for lethargy given.  Patient has high history of falls due to peripheral neuropathy and deconditioning.  He was found to have left distal fibula fracture involving left ankle joint, bilateral toe fractures (lt 1st toe sesamoid fx; rt 5th proximal phalanx) along with some lacerations in his left foot with surrounding cellulitis and he was admitted.    PT Comments    Pt performed transfer activities but ultimately remains to weak to achieve standing and maintain weight bearing restriction on L.  Pt did perform better with slide board lateral scoot transfer.  Pt fatigues quickly and complains of pain in his back.  Plan remains appropriate for short term rehab ar SNF to improve strength and function before returning home.    Follow Up Recommendations  SNF;Supervision/Assistance - 24 hour     Equipment Recommendations       Recommendations for Other Services OT consult     Precautions / Restrictions Precautions Precautions: Fall Required Braces or Orthoses: Other Brace/Splint Other Brace/Splint: left camwalker, R toe fx should be buddy taped.   Restrictions Weight Bearing Restrictions: Yes LLE Weight Bearing: Non weight bearing Other Position/Activity Restrictions: No weight bearing orders in chart for R LE.      Mobility  Bed Mobility Overal bed mobility: Needs Assistance Bed Mobility: Supine to Sit;Sit to Supine   Sidelying to sit: Min guard       General bed mobility comments: Pt was able to advance LE's towards EOB. Pt required increased time and effort to transfer trunk into full  sitting position.   Transfers Overall transfer level: Needs assistance Equipment used: Rolling walker (2 wheeled);None Transfers: Sit to/from Stand;Lateral/Scoot Transfers Sit to Stand: Max assist(Pt unable to achieve full standing and maintain L NWB.  )        Lateral/Scoot Transfers: Mod assist General transfer comment: Pt attempted sit to stand with RW and assistance to boost into standing.  Pt made it 3/4 of the way to standing then began to place weight through LLE so opted for lateral scoot from bed to chair with a slide board.  Pt required cues for hand placement to keep board from sliding but able to maintain  LLE NWB during lateral scoot.    Ambulation/Gait Ambulation/Gait assistance: (NT)               Stairs             Wheelchair Mobility    Modified Rankin (Stroke Patients Only)       Balance                                            Cognition Arousal/Alertness: Awake/alert Behavior During Therapy: Flat affect Overall Cognitive Status: No family/caregiver present to determine baseline cognitive functioning                                 General Comments: intermittent confusion noted during session, Pt unable to answer question appropriately.  When asked if he needed his urinal patient replies, "I did not order lunch."      Exercises      General Comments        Pertinent Vitals/Pain Pain Assessment: No/denies pain Pain Score: 6  Pain Location: back>L ankle Pain Descriptors / Indicators: Grimacing;Guarding;Discomfort Pain Intervention(s): Monitored during session;Premedicated before session;Repositioned    Home Living                      Prior Function            PT Goals (current goals can now be found in the care plan section) Acute Rehab PT Goals Patient Stated Goal: decrease pain Potential to Achieve Goals: Good Progress towards PT goals: Progressing toward goals     Frequency    Min 3X/week      PT Plan Current plan remains appropriate    Co-evaluation              AM-PAC PT "6 Clicks" Daily Activity  Outcome Measure  Difficulty turning over in bed (including adjusting bedclothes, sheets and blankets)?: None Difficulty moving from lying on back to sitting on the side of the bed? : A Little Difficulty sitting down on and standing up from a chair with arms (e.g., wheelchair, bedside commode, etc,.)?: A Lot Help needed moving to and from a bed to chair (including a wheelchair)?: A Lot Help needed walking in hospital room?: Total Help needed climbing 3-5 steps with a railing? : Total 6 Click Score: 13    End of Session Equipment Utilized During Treatment: Gait belt Activity Tolerance: Patient limited by pain Patient left: in chair;with call bell/phone within reach(B LEs elevated.  ) Nurse Communication: Mobility status;Weight bearing status PT Visit Diagnosis: History of falling (Z91.81);Difficulty in walking, not elsewhere classified (R26.2);Pain Pain - part of body: (back)     Time: 5974-7185 PT Time Calculation (min) (ACUTE ONLY): 18 min  Charges:  $Therapeutic Activity: 8-22 mins                    G Codes:       Governor Rooks, PTA pager [phone]    Cristela Blue 08/05/2017, 4:14 PM

## 2017-08-05 NOTE — Progress Notes (Signed)
PROGRESS NOTE  [name] SPQ:[id number] DOB: 07/07/1965 DOA: 08/02/2017 PCP: [name], MD  HPI/Recap of past 24 hours: [name] is a 52 y.o. male past medical history significant for CKD, suicide attempt, hypertension who presents to the emergency room with lethargy.  No reason for lethargy given.  Patient denies.  Sister says patient's memory is faulty.  Both agree that patient likely does fail as he usually does.  Patient has high history of falls due to his Parkinson's.  Commonly falls or hurts himself.  Recently fell hit his head and got a black eye.  Denies any SI or HI. Imaging shows multiple fractures to the lower extremities.  Head and neck cleared by EDP.  Hospitalist for admission.  08/03/2017: Patient seen and examined at his bedside.  He reports 5-6 out of 10 pain in his left ankle prior to receiving his pain medication.  Was seen by orthopedic surgery on presentation with recommendation to follow-up in 2 weeks, nonweightbearing on the left lower extremity and anticipate weightbearing in about 4 weeks.  Patient is in a fracture boot.   08/04/2017 patient seen and examined at his bedside.  He reports feeling chronically depressed.  Denies suicidal ideation or homicidal ideation.  08/05/2017: Patient seen.  No new complaints.  Patient is awaiting disposition.  No fever or chills, no chest pain, no shortness of breath.  Assessment/Plan: Active Problems:   Bipolar disorder ([place])   Essential hypertension   Diabetes type 2, uncontrolled (HCC)   Degenerative joint disease (DJD) of hip   Dyslipidemia   Chronic kidney disease   Fall   Dementia, early onset with advanced brain atrophy for age   Diabetic neuropathy ([place])   Closed displaced fracture of lateral malleolus of left fibula   Generalized weakness/fatigue/physical debility:  Slowly improving TSH and vitamin B12 normal Psych consulted  Right plantar region fifth toe laceration secondary to fall: Orthopedic  surgery consulted, Dr. Marlou Sa.  Highly appreciated. Continue IV antibiotics  Left ankle fracture secondary to mechanical fall: Orthopedic surgery consulted recommended to continue wearing fracture boot follow-up in 2 weeks and nonweightbearing for 4 weeks. Pain management in place Bowel regimen in place PT recommends SNF Fall precautions  AKI on CKD 3, improving Back to baseline at 1.6 Creatinine on admission 2.09 Avoidnephrotoxic agents/hypotension/dehydration  Left dorsal foot cellulitis Continue Augmentin and IV vancomycin No systemic sign of infection, afebrile and no leukocytosis Monitor fever curve No leukocytosis  Ambulatory dysfunction with frequent falls, unclear etiology PT following and recommends SNF Fall precautions in place  Chronic thrombocytopenia, stable Platelet 86 from 75 No sign of overt bleeding  Type 2 diabetes, uncontrolled due to hyperglycemia Continue insulin sliding scale Last hemoglobin A1c 8.2 from 05/27/2017  Bipolar disorder/depression/anxiety Denies suicidal ideation or homicidal ideation Continue home medications  History of seizures Continue home medications  Code Status: Full code  Family Communication:   Disposition Plan: Home versus SNF when clinically stable   Consultants: Orthopedic surgery.  Procedures: None  Antimicrobials:  IV vancomycin and p.o. Augmentin  DVT prophylaxis: SCD, subcu Lovenox daily   Objective: Vitals:   08/04/17 0954 08/04/17 1406 08/04/17 2029 08/05/17 0540  BP: 114/75 118/65 132/81 106/68  Pulse: 72 76 74 61  Resp: 14 16  16   Temp: 98.4 F (36.9 C) 98.8 F (37.1 C) 98.3 F (36.8 C)   TempSrc: Oral Oral Oral   SpO2: 96% 98% 98% 96%  Weight:      Height:  Intake/Output Summary (Last 24 hours) at 08/05/2017 1200 Last data filed at 08/05/2017 0900 Gross per 24 hour  Intake 3951.25 ml  Output 650 ml  Net 3301.25 ml   Filed Weights   08/03/17 1000  Weight: 91 kg (200 lb 9.9 oz)      Exam: 08/04/2017   General: 52 year old Caucasian male well-developed well-nourished in no acute distress.  Alert oriented x3.  Cardiovascular: Regular rate and rhythm with no rubs or gallops.  No JVD or thyromegaly present.  Respiratory: Clear to auscultation with no wheezes or rales.  Abdomen: Soft nontender nondistended with normal bowel sounds x4 quadrant.  Musculoskeletal:.  Left lower extremity is in a fracture boot  Skin: No ulcerative lesions.  Psychiatry: Mood is appropriate for condition and setting.   Data Reviewed: CBC: Recent Labs  Lab 08/02/17 0346 08/02/17 0735 08/03/17 0611 08/04/17 0611  WBC 7.6 7.6 5.7 4.7  NEUTROABS 5.4  --   --   --   HGB 13.5 12.9* 11.7* 10.8*  HCT 37.6* 37.4* 33.1* 31.4*  MCV 86.4 86.6 88.0 85.8  PLT 99* 87* 75* 86*   Basic Metabolic Panel: Recent Labs  Lab 08/02/17 0346 08/02/17 0735 08/03/17 0611 08/04/17 0611  NA 136 136 139 139  K 6.1* 5.0 3.9 3.8  CL 103 101 104 105  CO2 25 25 25 26   GLUCOSE 253* 226* 126* 139*  BUN 22* 20 20 24*  CREATININE 2.23* 2.09* 1.77* 1.69*  CALCIUM 9.5 9.3 8.8* 9.1   GFR: Estimated Creatinine Clearance: 64.9 mL/min (A) (by C-G formula based on SCr of 1.69 mg/dL (H)). Liver Function Tests: Recent Labs  Lab 08/02/17 0346  AST 20  ALT 17  ALKPHOS 58  BILITOT 0.9  PROT 6.8  ALBUMIN 3.6   No results for input(s): LIPASE, AMYLASE in the last 168 hours. No results for input(s): AMMONIA in the last 168 hours. Coagulation Profile: No results for input(s): INR, PROTIME in the last 168 hours. Cardiac Enzymes: No results for input(s): CKTOTAL, CKMB, CKMBINDEX, TROPONINI in the last 168 hours. BNP (last 3 results) No results for input(s): PROBNP in the last 8760 hours. HbA1C: No results for input(s): HGBA1C in the last 72 hours. CBG: Recent Labs  Lab 08/02/17 0415 08/04/17 2141 08/05/17 0610  GLUCAP 224* 142* 101*   Lipid Profile: No results for input(s): CHOL, HDL, LDLCALC,  TRIG, CHOLHDL, LDLDIRECT in the last 72 hours. Thyroid Function Tests: Recent Labs    08/04/17 0611  TSH 2.108   Anemia Panel: Recent Labs    08/04/17 0611  VITAMINB12 530  FERRITIN 468*  TIBC 263  IRON 65   Urine analysis:    Component Value Date/Time   COLORURINE YELLOW 08/02/2017 0509   APPEARANCEUR CLEAR 08/02/2017 0509   LABSPEC 1.024 08/02/2017 0509   PHURINE 6.0 08/02/2017 0509   GLUCOSEU >=500 (A) 08/02/2017 0509   GLUCOSEU >=1000 (A) 06/02/2016 0844   HGBUR SMALL (A) 08/02/2017 0509   BILIRUBINUR NEGATIVE 08/02/2017 0509   KETONESUR 20 (A) 08/02/2017 0509   PROTEINUR 30 (A) 08/02/2017 0509   UROBILINOGEN 0.2 06/02/2016 0844   NITRITE NEGATIVE 08/02/2017 0509   LEUKOCYTESUR NEGATIVE 08/02/2017 0509   Sepsis Labs: @LABRCNTIP (procalcitonin:4,lacticidven:4)  )No results found for this or any previous visit (from the past 240 hour(s)).    Studies: No results found.  Scheduled Meds: . amoxicillin-clavulanate  1 tablet Oral TID  . atorvastatin  10 mg Oral Daily  . buPROPion  300 mg Oral Daily  . dextrose  25 g Intravenous Once  . divalproex  1,500 mg Oral QHS  . enoxaparin (LOVENOX) injection  40 mg Subcutaneous Q24H  . feeding supplement (ENSURE ENLIVE)  237 mL Oral BID BM  . ibuprofen  400 mg Oral Once  . insulin glargine  25 Units Subcutaneous Q2200  . QUEtiapine  400 mg Oral QHS  . sodium chloride flush  3 mL Intravenous Q12H  . Tdap  0.5 mL Intramuscular Once  . traZODone  150 mg Oral QHS    Continuous Infusions: . sodium chloride 75 mL/hr at 08/05/17 0549  . calcium gluconate    . vancomycin Stopped (08/05/17 5284)     LOS: 2 days     Bonnell Public, MD [facility] Pager [phone]  If 7PM-7AM, please contact night-coverage [email or web address] Password [email or web address] 08/05/2017, 12:00 PM

## 2017-08-05 NOTE — Social Work (Signed)
CSW discussed pt's mental health hx with sister. Sister agreeable to SNF at discharge. CSW discussed barriers for placement as patient has medicaid and many SNF's do not take medicaid. CSW working to find patient placement.  CSW contacted SN[facility] and [facility] to see if they can offer a SNF bed.  They will review and get back to CSW. CSw f/u with [facility], and [facility] Pt received many denials for placement due to insurance.  CSW will continue to follow up.  Elissa Hefty, LCSW Clinical Social Worker [phone]

## 2017-08-06 DIAGNOSIS — W19XXXD Unspecified fall, subsequent encounter: Secondary | ICD-10-CM

## 2017-08-06 DIAGNOSIS — N183 Chronic kidney disease, stage 3 (moderate): Secondary | ICD-10-CM

## 2017-08-06 LAB — GLUCOSE, CAPILLARY
Glucose-Capillary: 99 mg/dL (ref 65–99)
Glucose-Capillary: 215 mg/dL — ABNORMAL HIGH (ref 65–99)

## 2017-08-06 LAB — VANCOMYCIN, TROUGH: Vancomycin Tr: 14 ug/mL — ABNORMAL LOW (ref 15–20)

## 2017-08-06 NOTE — Progress Notes (Signed)
Nutrition Brief Note  Patient identified on the Malnutrition Screening Tool (MST) Report.  Wt Readings from Last 15 Encounters:  08/03/17 200 lb 9.9 oz (91 kg)  05/27/17 206 lb (93.4 kg)  03/19/17 202 lb (91.6 kg)  03/18/17 204 lb 9.6 oz (92.8 kg)  02/04/17 194 lb 6.4 oz (88.2 kg)  09/15/16 214 lb 1.1 oz (97.1 kg)  06/02/16 217 lb (98.4 kg)  03/31/16 210 lb (95.3 kg)  03/19/16 218 lb (98.9 kg)  01/29/16 202 lb (91.6 kg)  01/01/16 198 lb 13.7 oz (90.2 kg)  11/26/15 211 lb (95.7 kg)  10/12/15 205 lb (93 kg)  10/03/15 215 lb 13.3 oz (97.9 kg)  09/26/15 207 lb (93.9 kg)   Body mass index is 24.42 kg/m. Patient meets criteria for Normal based on current BMI.   Current diet order is Regular, patient is consuming approximately 100% of meals at this time.   Labs and medications reviewed. Receiving Ensure Enlive nutrition supplements BID.  No nutrition interventions warranted at this time. If nutrition issues arise, please consult RD.   Arthur Holms, RD, LDN Pager #: [phone] After-Hours Pager #: [phone]

## 2017-08-06 NOTE — Social Work (Addendum)
CSW f/u with SNF's,still no bed offer for placement. CSW spoke to admission at [facility] and [facility]. [facility] has reviewed again and declined to offer a SNF bed.  CSW will continue to follow up.  CSW met with patient and discussed barriers again to placement. Pt indicated if he could go to [facility] and/or [facility] and none of those SNF's has offered a bed.  CSW working to find placement.   PASSR# [id number] E  thru 09/04/17 for SNF placement.  Elissa Hefty, LCSW Clinical Social Worker [phone]

## 2017-08-06 NOTE — Progress Notes (Signed)
PROGRESS NOTE  JW COVIN PNT:[id number] DOB: 1966/01/21 DOA: 08/02/2017 PCP: [name], MD  HPI/Recap of past 24 hours: [name] is a 52 y.o. male past medical history significant for CKD, suicide attempt, hypertension who presents to the emergency room with lethargy.  No reason for lethargy given.  Patient denies.  Sister says patient's memory is faulty.  Both agree that patient likely does fail as he usually does.  Patient has high history of falls due to his Parkinson's.  Commonly falls or hurts himself.  Recently fell hit his head and got a black eye.  Denies any SI or HI. Imaging shows multiple fractures to the lower extremities.  Head and neck cleared by EDP.  Hospitalist for admission.  08/03/2017: Patient seen and examined at his bedside.  He reports 5-6 out of 10 pain in his left ankle prior to receiving his pain medication.  Was seen by orthopedic surgery on presentation with recommendation to follow-up in 2 weeks, nonweightbearing on the left lower extremity and anticipate weightbearing in about 4 weeks.  Patient is in a fracture boot.   08/04/2017 patient seen and examined at his bedside.  He reports feeling chronically depressed.  Denies suicidal ideation or homicidal ideation.  08/05/2017: Patient seen.  No new complaints.  Patient is awaiting disposition.  No fever or chills, no chest pain, no shortness of breath.  08/06/2016: Patient seen.  No new complaints.  Patient is awaiting disposition.  No fever chills, no chest pain, no shortness of breath.  Assessment/Plan: Active Problems:   Schizoaffective disorder, bipolar type ([place])   Essential hypertension   Diabetes type 2, uncontrolled (HCC)   Degenerative joint disease (DJD) of hip   Dyslipidemia   Chronic kidney disease   Fall   Dementia, early onset with advanced brain atrophy for age   Diabetic neuropathy ([place])   Closed displaced fracture of lateral malleolus of left fibula   Generalized  weakness/fatigue/physical debility:  Slowly improving TSH and vitamin B12 normal Psych consulted Pursue disposition.  Right plantar region fifth toe laceration secondary to fall: Orthopedic surgery consulted, Dr. Marlou Sa.  Highly appreciated. Complete course of oral antibiotics.    Left ankle fracture secondary to mechanical fall: Orthopedic surgery consulted recommended to continue wearing fracture boot follow-up in 2 weeks and nonweightbearing for 4 weeks. Pain management in place Bowel regimen in place PT recommends SNF Fall precautions Pursue disposition  AKI on CKD 3, improving Back to baseline at 1.6 Creatinine on admission 2.09 Avoidnephrotoxic agents/hypotension/dehydration  Left dorsal foot cellulitis Continue Augmentin and doxycycline  No systemic sign of infection, afebrile and no leukocytosis Monitor fever curve No leukocytosis  Ambulatory dysfunction with frequent falls, unclear etiology PT following and recommends SNF Fall precautions in place  Chronic thrombocytopenia, stable Platelet 86 from 75 No sign of overt bleeding  Type 2 diabetes, uncontrolled due to hyperglycemia Continue insulin sliding scale Last hemoglobin A1c 8.2 from 05/27/2017  Bipolar disorder/depression/anxiety Denies suicidal ideation or homicidal ideation Continue home medications  History of seizures Continue home medications  Code Status: Full code  Family Communication:   Disposition Plan: Home versus SNF when clinically stable   Consultants: Orthopedic surgery.  Procedures: None  Antimicrobials:  Oral doxycycline and p.o. Augmentin  DVT prophylaxis: SCD, subcu Lovenox daily   Objective: Vitals:   08/05/17 1441 08/05/17 2027 08/06/17 0517 08/06/17 1339  BP: 120/76 112/75 105/72 126/77  Pulse: 69 73 69 77  Resp: 17 16 14 17   Temp: 98.4 F (36.9  C) 99.6 F (37.6 C) 99.1 F (37.3 C) 98 F (36.7 C)  TempSrc: Oral Oral Oral Oral  SpO2: 98% 99% 100% 100%    Weight:      Height:        Intake/Output Summary (Last 24 hours) at 08/06/2017 1554 Last data filed at 08/06/2017 1300 Gross per 24 hour  Intake 370 ml  Output 250 ml  Net 120 ml   Filed Weights   08/03/17 1000  Weight: 91 kg (200 lb 9.9 oz)    Exam: 08/04/2017   General: 52 year old Caucasian male well-developed well-nourished in no acute distress.  Alert oriented x3.  Cardiovascular: Regular rate and rhythm with no rubs or gallops.  No JVD or thyromegaly present.  Respiratory: Clear to auscultation with no wheezes or rales.  Abdomen: Soft nontender nondistended with normal bowel sounds x4 quadrant.  Musculoskeletal:.  Left lower extremity is in a fracture boot  Skin: No ulcerative lesions.  Psychiatry: Mood is appropriate for condition and setting.   Data Reviewed: CBC: Recent Labs  Lab 08/02/17 0346 08/02/17 0735 08/03/17 0611 08/04/17 0611  WBC 7.6 7.6 5.7 4.7  NEUTROABS 5.4  --   --   --   HGB 13.5 12.9* 11.7* 10.8*  HCT 37.6* 37.4* 33.1* 31.4*  MCV 86.4 86.6 88.0 85.8  PLT 99* 87* 75* 86*   Basic Metabolic Panel: Recent Labs  Lab 08/02/17 0346 08/02/17 0735 08/03/17 0611 08/04/17 0611  NA 136 136 139 139  K 6.1* 5.0 3.9 3.8  CL 103 101 104 105  CO2 25 25 25 26   GLUCOSE 253* 226* 126* 139*  BUN 22* 20 20 24*  CREATININE 2.23* 2.09* 1.77* 1.69*  CALCIUM 9.5 9.3 8.8* 9.1   GFR: Estimated Creatinine Clearance: 64.9 mL/min (A) (by C-G formula based on SCr of 1.69 mg/dL (H)). Liver Function Tests: Recent Labs  Lab 08/02/17 0346  AST 20  ALT 17  ALKPHOS 58  BILITOT 0.9  PROT 6.8  ALBUMIN 3.6   No results for input(s): LIPASE, AMYLASE in the last 168 hours. No results for input(s): AMMONIA in the last 168 hours. Coagulation Profile: No results for input(s): INR, PROTIME in the last 168 hours. Cardiac Enzymes: No results for input(s): CKTOTAL, CKMB, CKMBINDEX, TROPONINI in the last 168 hours. BNP (last 3 results) No results for input(s):  PROBNP in the last 8760 hours. HbA1C: No results for input(s): HGBA1C in the last 72 hours. CBG: Recent Labs  Lab 08/02/17 0415 08/04/17 2141 08/05/17 0610 08/05/17 2207 08/06/17 0642  GLUCAP 224* 142* 101* 115* 99   Lipid Profile: No results for input(s): CHOL, HDL, LDLCALC, TRIG, CHOLHDL, LDLDIRECT in the last 72 hours. Thyroid Function Tests: Recent Labs    08/04/17 0611  TSH 2.108   Anemia Panel: Recent Labs    08/04/17 0611  VITAMINB12 530  FERRITIN 468*  TIBC 263  IRON 65   Urine analysis:    Component Value Date/Time   COLORURINE YELLOW 08/02/2017 0509   APPEARANCEUR CLEAR 08/02/2017 0509   LABSPEC 1.024 08/02/2017 0509   PHURINE 6.0 08/02/2017 0509   GLUCOSEU >=500 (A) 08/02/2017 0509   GLUCOSEU >=1000 (A) 06/02/2016 0844   HGBUR SMALL (A) 08/02/2017 0509   BILIRUBINUR NEGATIVE 08/02/2017 0509   KETONESUR 20 (A) 08/02/2017 0509   PROTEINUR 30 (A) 08/02/2017 0509   UROBILINOGEN 0.2 06/02/2016 0844   NITRITE NEGATIVE 08/02/2017 0509   LEUKOCYTESUR NEGATIVE 08/02/2017 0509   Sepsis Labs: @LABRCNTIP (procalcitonin:4,lacticidven:4)  )No results found for this  or any previous visit (from the past 240 hour(s)).    Studies: No results found.  Scheduled Meds: . amoxicillin-clavulanate  1 tablet Oral TID  . atorvastatin  10 mg Oral Daily  . buPROPion  300 mg Oral Daily  . dextrose  25 g Intravenous Once  . divalproex  1,500 mg Oral QHS  . doxycycline  100 mg Oral Q12H  . enoxaparin (LOVENOX) injection  40 mg Subcutaneous Q24H  . feeding supplement (ENSURE ENLIVE)  237 mL Oral BID BM  . ibuprofen  400 mg Oral Once  . insulin glargine  25 Units Subcutaneous Q2200  . QUEtiapine  400 mg Oral QHS  . sodium chloride flush  3 mL Intravenous Q12H  . Tdap  0.5 mL Intramuscular Once  . traZODone  150 mg Oral QHS    Continuous Infusions: . sodium chloride 75 mL/hr at 08/05/17 0549  . calcium gluconate       LOS: 3 days     Bonnell Public,  MD [facility] Pager [phone]  If 7PM-7AM, please contact night-coverage [email or web address] Password [facility] 08/06/2017, 3:54 PM

## 2017-08-06 NOTE — Progress Notes (Signed)
Physical Therapy Treatment Patient Details Name: TASHAUN OBEY MRN: [id number] DOB: 1965/06/03 Today's Date: 08/06/2017    History of Present Illness LLEWELYN SHEAFFER is a 52 y.o. male past medical history significant for CKD, suicide attempt, hypertension, peripheral neuropathy, ?early onset dementia vs Parkinson's, bil THR, back surgeries who presents to the emergency room with lethargy.  No reason for lethargy given.  Patient has high history of falls due to peripheral neuropathy and deconditioning.  He was found to have left distal fibula fracture involving left ankle joint, bilateral toe fractures (lt 1st toe sesamoid fx; rt 5th proximal phalanx) along with some lacerations in his left foot with surrounding cellulitis and he was admitted.    PT Comments    Pt received in bed and agreeable to participation in therapy. Pt transitioned to EOB min guard assist. Mod assist required for sit to stand and mod assist +2 safety for SPT with RW bed to recliner. Verbal cues required throughout mobility for sequencing and focus on task. Pt positioned in recliner with feet elevated at end of session. He reported 7/10 pain and requested pain meds. RN notified.   Follow Up Recommendations  SNF;Supervision/Assistance - 24 hour     Equipment Recommendations  None recommended by PT(TBD at next venue, if going home will need a w/c)    Recommendations for Other Services       Precautions / Restrictions Precautions Precautions: Fall Required Braces or Orthoses: Other Brace/Splint Other Brace/Splint: left camwalker, R toe fx should be buddy taped.   Restrictions LLE Weight Bearing: Non weight bearing Other Position/Activity Restrictions: No weight bearing orders in chart for R LE.      Mobility  Bed Mobility   Bed Mobility: Rolling Rolling: Modified independent (Device/Increase time) Sidelying to sit: Min guard;HOB elevated       General bed mobility comments: +rail, increased time and  effort  Transfers Overall transfer level: Needs assistance Equipment used: Rolling walker (2 wheeled)   Sit to Stand: Mod assist;From elevated surface Stand pivot transfers: Mod assist;+2 safety/equipment       General transfer comment: Increased time and effort. Verbal cues for hand placement and sequencing. Pt able to maintain NWB LLE during transfer.  Ambulation/Gait             General Gait Details: unable due to pain and weakness   Stairs             Wheelchair Mobility    Modified Rankin (Stroke Patients Only)       Balance   Sitting-balance support: No upper extremity supported;Feet unsupported Sitting balance-Leahy Scale: Good     Standing balance support: Bilateral upper extremity supported;During functional activity Standing balance-Leahy Scale: Poor Standing balance comment: reliant on UE support/external support from therapist                             Cognition Arousal/Alertness: Awake/alert Behavior During Therapy: Flat affect Overall Cognitive Status: No family/caregiver present to determine baseline cognitive functioning                                 General Comments: Intermittent confusion. Slow to respond. Difficulty staying on task.      Exercises      General Comments        Pertinent Vitals/Pain Pain Assessment: 0-10 Pain Score: 7  Pain Location: back > L ankle  Pain Descriptors / Indicators: Grimacing;Guarding;Sore Pain Intervention(s): Limited activity within patient's tolerance;Patient requesting pain meds-RN notified;Repositioned    Home Living                      Prior Function            PT Goals (current goals can now be found in the care plan section) Acute Rehab PT Goals Patient Stated Goal: decrease pain PT Goal Formulation: With patient Time For Goal Achievement: 08/16/17 Potential to Achieve Goals: Good Progress towards PT goals: Progressing toward goals     Frequency    Min 3X/week      PT Plan Current plan remains appropriate    Co-evaluation              AM-PAC PT "6 Clicks" Daily Activity  Outcome Measure  Difficulty turning over in bed (including adjusting bedclothes, sheets and blankets)?: None Difficulty moving from lying on back to sitting on the side of the bed? : A Little Difficulty sitting down on and standing up from a chair with arms (e.g., wheelchair, bedside commode, etc,.)?: A Lot Help needed moving to and from a bed to chair (including a wheelchair)?: A Lot Help needed walking in hospital room?: Total Help needed climbing 3-5 steps with a railing? : Total 6 Click Score: 13    End of Session Equipment Utilized During Treatment: Gait belt Activity Tolerance: Patient limited by pain Patient left: in chair;with call bell/phone within reach;Other (comment)(BLE elevated) Nurse Communication: Mobility status PT Visit Diagnosis: History of falling (Z91.81);Difficulty in walking, not elsewhere classified (R26.2);Pain Pain - Right/Left: Left Pain - part of body: Ankle and joints of foot     Time: 1155-2080 PT Time Calculation (min) (ACUTE ONLY): 24 min  Charges:  $Therapeutic Activity: 23-37 mins                    G Codes:       Lorrin Goodell, PT  Office # [phone] Pager [phone]    Lorriane Shire 08/06/2017, 10:41 AM

## 2017-08-07 DIAGNOSIS — L03116 Cellulitis of left lower limb: Secondary | ICD-10-CM

## 2017-08-07 DIAGNOSIS — S91114A Laceration without foreign body of right lesser toe(s) without damage to nail, initial encounter: Secondary | ICD-10-CM

## 2017-08-07 LAB — GLUCOSE, CAPILLARY
Glucose-Capillary: 127 mg/dL — ABNORMAL HIGH (ref 65–99)
Glucose-Capillary: 204 mg/dL — ABNORMAL HIGH (ref 65–99)

## 2017-08-07 NOTE — Plan of Care (Signed)

## 2017-08-07 NOTE — Progress Notes (Signed)
Occupational Therapy Treatment Patient Details Name: [name] MRN: [id number] DOB: 1965/12/06 Today's Date: 08/07/2017    History of present illness [name] is a 52 y.o. male past medical history significant for CKD, suicide attempt, hypertension, peripheral neuropathy, ?early onset dementia vs Parkinson's, bil THR, back surgeries who presents to the emergency room with lethargy.  No reason for lethargy given.  Patient has high history of falls due to peripheral neuropathy and deconditioning.  He was found to have left distal fibula fracture involving left ankle joint, bilateral toe fractures (lt 1st toe sesamoid fx; rt 5th proximal phalanx) along with some lacerations in his left foot with surrounding cellulitis and he was admitted.   OT comments  Pt presents supine in bed, sleepy but agreeable to OT treatment session. Pt completing bed mobility with MinGuard assist, sit<>stand and stand pivot using RW with overall ModA this session, demonstrates ability to maintain NWB in LLE with min verbal cues. Pt continues to require max-total assist for LB ADLs. Feel SNF recommendation remains appropriate at this time. Will continue to follow acutely to progress pt towards established OT goals.   Follow Up Recommendations  SNF;Supervision/Assistance - 24 hour    Equipment Recommendations  Other (comment)(TBD in next venue )          Precautions / Restrictions Precautions Precautions: Fall Required Braces or Orthoses: Other Brace/Splint Other Brace/Splint: left camwalker, R toe fx should be buddy taped.   Restrictions Weight Bearing Restrictions: Yes LLE Weight Bearing: Non weight bearing Other Position/Activity Restrictions: No weight bearing orders in chart for R LE.         Mobility Bed Mobility Overal bed mobility: Needs Assistance       Supine to sit: Min guard     General bed mobility comments: +rail, increased time and effort  Transfers Overall transfer level: Needs  assistance Equipment used: Rolling walker (2 wheeled) Transfers: Sit to/from [facility] Sit to Stand: Mod assist;From elevated surface Stand pivot transfers: Mod assist       General transfer comment: Increased time and effort, assist to rise and steady from elevated bed surface. Verbal cues for hand placement and sequencing. Pt able to maintain NWB LLE during transfer.    Balance Overall balance assessment: Needs assistance;History of Falls Sitting-balance support: No upper extremity supported;Feet unsupported Sitting balance-Leahy Scale: Good     Standing balance support: Bilateral upper extremity supported;During functional activity Standing balance-Leahy Scale: Poor Standing balance comment: reliant on UE support/external support from therapist                            ADL either performed or assessed with clinical judgement   ADL Overall ADL's : Needs assistance/impaired                       Lower Body Dressing Details (indicate cue type and reason): pt requiring total assist for donning R sock this session              Functional mobility during ADLs: Moderate assistance;Rolling walker(stand pivot only ) General ADL Comments: pt declined completing ADLs this session but agreeable to OOB to chair; completing stand pivot using RW with overall ModA; pt's R foot not buddy taped at start of session, applied taping prior to mobility                       Cognition Arousal/Alertness:  Awake/alert Behavior During Therapy: Flat affect Overall Cognitive Status: No family/caregiver present to determine baseline cognitive functioning                                 General Comments: Intermittent confusion. Slow to respond. Difficulty staying on task.                          Pertinent Vitals/ Pain       Pain Assessment: Faces Faces Pain Scale: Hurts even more Pain Location: back > L ankle Pain  Descriptors / Indicators: Grimacing;Guarding;Sore Pain Intervention(s): Monitored during session;Repositioned                                                          Frequency  Min 2X/week        Progress Toward Goals  OT Goals(current goals can now be found in the care plan section)  Progress towards OT goals: Progressing toward goals  Acute Rehab OT Goals Patient Stated Goal: decrease pain OT Goal Formulation: With patient Time For Goal Achievement: 08/17/17 Potential to Achieve Goals: Good  Plan Discharge plan remains appropriate                    AM-PAC PT "6 Clicks" Daily Activity     Outcome Measure   Help from another person eating meals?: None Help from another person taking care of personal grooming?: A Little Help from another person toileting, which includes using toliet, bedpan, or urinal?: A Lot Help from another person bathing (including washing, rinsing, drying)?: A Lot Help from another person to put on and taking off regular upper body clothing?: A Little Help from another person to put on and taking off regular lower body clothing?: A Lot 6 Click Score: 16    End of Session Equipment Utilized During Treatment: Gait belt;Rolling walker;Other (comment)(L CAM boot )  OT Visit Diagnosis: Other abnormalities of gait and mobility (R26.89);History of falling (Z91.81);Pain Pain - Right/Left: Left Pain - part of body: Leg;Ankle and joints of foot(back )   Activity Tolerance Patient tolerated treatment well   Patient Left in chair;with call bell/phone within reach;with chair alarm set   Nurse Communication Mobility status;Patient requests pain meds        Time: 1050-1119 OT Time Calculation (min): 29 min  Charges: OT General Charges $OT Visit: 1 Visit OT Treatments $Therapeutic Activity: 23-37 mins  Lou Cal, [place] Pager [phone] 08/07/2017    Raymondo Band 08/07/2017, 1:18 PM

## 2017-08-07 NOTE — Progress Notes (Signed)
PROGRESS NOTE  [name] ZWC:[id number] DOB: [birth year] DOA: 08/02/2017 PCP: [name], MD  HPI/Recap of past 24 hours: [name] is a 52 y.o. male past medical history significant for CKD, suicide attempt, hypertension who presents to the emergency room with lethargy.  No reason for lethargy given.  Patient denies.  Sister says patient's memory is faulty.  Both agree that patient likely does fail as he usually does.  Patient has high history of falls due to his Parkinson's.  Commonly falls or hurts himself.  Recently fell hit his head and got a black eye.  Denies any SI or HI. Imaging shows multiple fractures to the lower extremities.  Head and neck cleared by EDP.  Hospitalist for admission.  08/03/2017: Patient seen and examined at his bedside.  He reports 5-6 out of 10 pain in his left ankle prior to receiving his pain medication.  Was seen by orthopedic surgery on presentation with recommendation to follow-up in 2 weeks, nonweightbearing on the left lower extremity and anticipate weightbearing in about 4 weeks.  Patient is in a fracture boot.   08/04/2017 patient seen and examined at his bedside.  He reports feeling chronically depressed.  Denies suicidal ideation or homicidal ideation.  08/05/2017: Patient seen.  No new complaints.  Patient is awaiting disposition.  No fever or chills, no chest pain, no shortness of breath.  08/06/2017: Patient seen.  No new complaints.  Patient is awaiting disposition.  No fever chills, no chest pain, no shortness of breath.  08/07/2017: Patient seen.  No new complaints.  Kindly pursue disposition.    Assessment/Plan: Active Problems:   Schizoaffective disorder, bipolar type ([place])   Essential hypertension   Diabetes type 2, uncontrolled (HCC)   Degenerative joint disease (DJD) of hip   Dyslipidemia   Chronic kidney disease   Fall   Dementia, early onset with advanced brain atrophy for age   Diabetic neuropathy ([place])   Closed displaced  fracture of lateral malleolus of left fibula   Generalized weakness/fatigue/physical debility:  Slowly improving TSH and vitamin B12 normal Psych consulted Pursue disposition.  Right plantar region fifth toe laceration secondary to fall: Orthopedic surgery consulted, Dr. Marlou Sa.  Highly appreciated. Complete course of oral antibiotics.    Left ankle fracture secondary to mechanical fall: Orthopedic surgery consulted recommended to continue wearing fracture boot follow-up in 2 weeks and nonweightbearing for 4 weeks. Pain management in place Bowel regimen in place PT recommends SNF Fall precautions Pursue disposition  AKI on CKD 3, improving Back to baseline at 1.6 Creatinine on admission 2.09 Avoidnephrotoxic agents/hypotension/dehydration  Left dorsal foot cellulitis Continue Augmentin and doxycycline  No systemic sign of infection, afebrile and no leukocytosis Monitor fever curve No leukocytosis  Ambulatory dysfunction with frequent falls, unclear etiology PT following and recommends SNF Fall precautions in place  Chronic thrombocytopenia, stable Platelet 86 from 75 No sign of overt bleeding  Type 2 diabetes, uncontrolled due to hyperglycemia Continue insulin sliding scale Last hemoglobin A1c 8.2 from 05/27/2017  Bipolar disorder/depression/anxiety Denies suicidal ideation or homicidal ideation Continue home medications  History of seizures Continue home medications  Code Status: Full code  Family Communication:   Disposition Plan: Home versus SNF when clinically stable   Consultants: Orthopedic surgery.  Procedures: None  Antimicrobials:  Oral doxycycline and p.o. Augmentin  DVT prophylaxis: SCD, subcu Lovenox daily   Objective: Vitals:   08/06/17 0517 08/06/17 1339 08/06/17 1936 08/07/17 0504  BP: 105/72 126/77 114/72 103/70  Pulse: 69  77 73 73  Resp: 14 17 16 12   Temp: 99.1 F (37.3 C) 98 F (36.7 C) 98.6 F (37 C) 98 F (36.7 C)    TempSrc: Oral Oral Oral Oral  SpO2: 100% 100% 97% 95%  Weight:      Height:        Intake/Output Summary (Last 24 hours) at 08/07/2017 1255 Last data filed at 08/07/2017 0900 Gross per 24 hour  Intake 2025 ml  Output 1350 ml  Net 675 ml   Filed Weights   08/03/17 1000  Weight: 91 kg (200 lb 9.9 oz)    Exam: 08/04/2017   General: 52 year old Caucasian male well-developed well-nourished in no acute distress.  Alert oriented x3.  Cardiovascular: Regular rate and rhythm with no rubs or gallops.  No JVD or thyromegaly present.  Respiratory: Clear to auscultation with no wheezes or rales.  Abdomen: Soft nontender nondistended with normal bowel sounds x4 quadrant.  Musculoskeletal:.  Left lower extremity is in a fracture boot  Skin: No ulcerative lesions.  Psychiatry: Mood is appropriate for condition and setting.   Data Reviewed: CBC: Recent Labs  Lab 08/02/17 0346 08/02/17 0735 08/03/17 0611 08/04/17 0611  WBC 7.6 7.6 5.7 4.7  NEUTROABS 5.4  --   --   --   HGB 13.5 12.9* 11.7* 10.8*  HCT 37.6* 37.4* 33.1* 31.4*  MCV 86.4 86.6 88.0 85.8  PLT 99* 87* 75* 86*   Basic Metabolic Panel: Recent Labs  Lab 08/02/17 0346 08/02/17 0735 08/03/17 0611 08/04/17 0611  NA 136 136 139 139  K 6.1* 5.0 3.9 3.8  CL 103 101 104 105  CO2 25 25 25 26   GLUCOSE 253* 226* 126* 139*  BUN 22* 20 20 24*  CREATININE 2.23* 2.09* 1.77* 1.69*  CALCIUM 9.5 9.3 8.8* 9.1   GFR: Estimated Creatinine Clearance: 64.9 mL/min (A) (by C-G formula based on SCr of 1.69 mg/dL (H)). Liver Function Tests: Recent Labs  Lab 08/02/17 0346  AST 20  ALT 17  ALKPHOS 58  BILITOT 0.9  PROT 6.8  ALBUMIN 3.6   No results for input(s): LIPASE, AMYLASE in the last 168 hours. No results for input(s): AMMONIA in the last 168 hours. Coagulation Profile: No results for input(s): INR, PROTIME in the last 168 hours. Cardiac Enzymes: No results for input(s): CKTOTAL, CKMB, CKMBINDEX, TROPONINI in the last  168 hours. BNP (last 3 results) No results for input(s): PROBNP in the last 8760 hours. HbA1C: No results for input(s): HGBA1C in the last 72 hours. CBG: Recent Labs  Lab 08/05/17 0610 08/05/17 2207 08/06/17 0642 08/06/17 2118 08/07/17 0634  GLUCAP 101* 115* 99 215* 127*   Lipid Profile: No results for input(s): CHOL, HDL, LDLCALC, TRIG, CHOLHDL, LDLDIRECT in the last 72 hours. Thyroid Function Tests: No results for input(s): TSH, T4TOTAL, FREET4, T3FREE, THYROIDAB in the last 72 hours. Anemia Panel: No results for input(s): VITAMINB12, FOLATE, FERRITIN, TIBC, IRON, RETICCTPCT in the last 72 hours. Urine analysis:    Component Value Date/Time   COLORURINE YELLOW 08/02/2017 0509   APPEARANCEUR CLEAR 08/02/2017 0509   LABSPEC 1.024 08/02/2017 0509   PHURINE 6.0 08/02/2017 0509   GLUCOSEU >=500 (A) 08/02/2017 0509   GLUCOSEU >=1000 (A) 06/02/2016 0844   HGBUR SMALL (A) 08/02/2017 0509   BILIRUBINUR NEGATIVE 08/02/2017 0509   KETONESUR 20 (A) 08/02/2017 0509   PROTEINUR 30 (A) 08/02/2017 0509   UROBILINOGEN 0.2 06/02/2016 0844   NITRITE NEGATIVE 08/02/2017 0509   LEUKOCYTESUR NEGATIVE 08/02/2017 0509  Sepsis Labs: @LABRCNTIP (procalcitonin:4,lacticidven:4)  )No results found for this or any previous visit (from the past 240 hour(s)).    Studies: No results found.  Scheduled Meds: . amoxicillin-clavulanate  1 tablet Oral TID  . atorvastatin  10 mg Oral Daily  . buPROPion  300 mg Oral Daily  . dextrose  25 g Intravenous Once  . divalproex  1,500 mg Oral QHS  . doxycycline  100 mg Oral Q12H  . enoxaparin (LOVENOX) injection  40 mg Subcutaneous Q24H  . feeding supplement (ENSURE ENLIVE)  237 mL Oral BID BM  . ibuprofen  400 mg Oral Once  . insulin glargine  25 Units Subcutaneous Q2200  . QUEtiapine  400 mg Oral QHS  . sodium chloride flush  3 mL Intravenous Q12H  . Tdap  0.5 mL Intramuscular Once  . traZODone  150 mg Oral QHS    Continuous Infusions: . sodium  chloride 75 mL/hr at 08/05/17 0549  . calcium gluconate       LOS: 4 days     Bonnell Public, MD [facility] Pager [phone]  If 7PM-7AM, please contact night-coverage [email or web address] Password [facility] 08/07/2017, 12:55 PM

## 2017-08-08 DIAGNOSIS — S8262XD Displaced fracture of lateral malleolus of left fibula, subsequent encounter for closed fracture with routine healing: Secondary | ICD-10-CM

## 2017-08-08 LAB — GLUCOSE, CAPILLARY
GLUCOSE-CAPILLARY: 121 mg/dL — AB (ref 65–99)
GLUCOSE-CAPILLARY: 209 mg/dL — AB (ref 65–99)
Glucose-Capillary: 206 mg/dL — ABNORMAL HIGH (ref 65–99)
Glucose-Capillary: 178 mg/dL — ABNORMAL HIGH (ref 65–99)

## 2017-08-08 MED ORDER — TADALAFIL 5 MG PO TABS
5.0000 mg | ORAL_TABLET | Freq: Every morning | ORAL | Status: DC
  Administered 2017-08-08 – 2017-08-11 (×4): 5 mg via ORAL
  Filled 2017-08-08 (×4): qty 1

## 2017-08-08 NOTE — Progress Notes (Signed)
PROGRESS NOTE  MONTREZ MARIETTA JJH:[id number] DOB: June 08, 1965 DOA: 08/02/2017 PCP: [name], MD  HPI/Recap of past 24 hours: WAI LITT is a 52 y.o. male past medical history significant for CKD, suicide attempt, hypertension who presents to the emergency room with lethargy.  No reason for lethargy given.  Patient denies.  Sister says patient's memory is faulty.  Both agree that patient likely does fail as he usually does.  Patient has high history of falls due to his Parkinson's.  Commonly falls or hurts himself.  Recently fell hit his head and got a black eye.  Denies any SI or HI. Imaging shows multiple fractures to the lower extremities.  Head and neck cleared by EDP.  Hospitalist for admission.  08/03/2017: Patient seen and examined at his bedside.  He reports 5-6 out of 10 pain in his left ankle prior to receiving his pain medication.  Was seen by orthopedic surgery on presentation with recommendation to follow-up in 2 weeks, nonweightbearing on the left lower extremity and anticipate weightbearing in about 4 weeks.  Patient is in a fracture boot.   08/04/2017 patient seen and examined at his bedside.  He reports feeling chronically depressed.  Denies suicidal ideation or homicidal ideation.  08/05/2017: Patient seen.  No new complaints.  Patient is awaiting disposition.  No fever or chills, no chest pain, no shortness of breath.  08/06/2017: Patient seen.  No new complaints.  Patient is awaiting disposition.  No fever chills, no chest pain, no shortness of breath.  08/07/2017: Patient seen.  No new complaints.  Kindly pursue disposition.    Subjective: Patient denies any fever, chills, abdominal pain, nausea or vomiting  Assessment/Plan: Active Problems:   Schizoaffective disorder, bipolar type (HCC)   Essential hypertension   Diabetes type 2, uncontrolled (HCC)   Degenerative joint disease (DJD) of hip   Dyslipidemia   Chronic kidney disease   Fall   Dementia, early onset  with advanced brain atrophy for age   Diabetic neuropathy ([place])   Closed displaced fracture of lateral malleolus of left fibula   Generalized weakness/fatigue/physical debility:  Slowly improving TSH and vitamin B12 normal Psych consulted Pursue disposition.  Right plantar region fifth toe laceration secondary to fall: Orthopedic surgery consulted, Dr. Marlou Sa.  Highly appreciated. Complete course of oral antibiotics.    Left ankle fracture secondary to mechanical fall: Orthopedic surgery consulted recommended to continue wearing fracture boot follow-up in 2 weeks and nonweightbearing for 4 weeks. Pain management in place Bowel regimen in place PT recommends SNF Fall precautions Pursue disposition  AKI on CKD 3, improving Back to baseline at 1.6 Creatinine on admission 2.09 Avoidnephrotoxic agents/hypotension/dehydration  Left dorsal foot cellulitis Continue Augmentin and doxycycline  No systemic sign of infection, afebrile and no leukocytosis Monitor fever curve No leukocytosis  Ambulatory dysfunction with frequent falls, unclear etiology PT following and recommends SNF Fall precautions in place  Chronic thrombocytopenia, stable Platelet 86 from 75 No sign of overt bleeding  Type 2 diabetes, uncontrolled due to hyperglycemia Continue insulin sliding scale Last hemoglobin A1c 8.2 from 05/27/2017  Bipolar disorder/depression/anxiety Denies suicidal ideation or homicidal ideation Continue home medications  History of seizures Continue home medications  DVT prophylaxis: subcutaneous Lovenox  Code Status: Full code  Family Communication: none at bedside  Disposition Plan:cleared for discharge, but awaiting bed availability   Consultants: Orthopedic surgery.  Procedures: None  Antimicrobials:  Oral doxycycline and p.o. Augmentin     Objective: Vitals:   08/07/17 0504 08/07/17  1342 08/07/17 1959 08/08/17 0605  BP: 103/70 118/78 112/70 125/75  Pulse:  73 78 71 72  Resp: 12 16    Temp: 98 F (36.7 C) 98.7 F (37.1 C) 98.3 F (36.8 C) 98.3 F (36.8 C)  TempSrc: Oral Oral Oral Oral  SpO2: 95% 96% 96% 97%  Weight:      Height:        Intake/Output Summary (Last 24 hours) at 08/08/2017 1303 Last data filed at 08/07/2017 1828 Gross per 24 hour  Intake 240 ml  Output 180 ml  Net 60 ml   Filed Weights   08/03/17 1000  Weight: 91 kg (200 lb 9.9 oz)    Exam: 08/04/2017  Awake alert oriented 3, laying in bed to drop and stress Good air entry bilateraly, no wheezing rales or rhonchi Regular rate and rhythm, no rubs murmurs or gallops abdomen soft, nontender, nondistended, bowel sounds present Right lower extremity with good pulses, no cyanosis, left lower extremity in fracture boot    Data Reviewed: CBC: Recent Labs  Lab 08/02/17 0346 08/02/17 0735 08/03/17 0611 08/04/17 0611  WBC 7.6 7.6 5.7 4.7  NEUTROABS 5.4  --   --   --   HGB 13.5 12.9* 11.7* 10.8*  HCT 37.6* 37.4* 33.1* 31.4*  MCV 86.4 86.6 88.0 85.8  PLT 99* 87* 75* 86*   Basic Metabolic Panel: Recent Labs  Lab 08/02/17 0346 08/02/17 0735 08/03/17 0611 08/04/17 0611  NA 136 136 139 139  K 6.1* 5.0 3.9 3.8  CL 103 101 104 105  CO2 25 25 25 26   GLUCOSE 253* 226* 126* 139*  BUN 22* 20 20 24*  CREATININE 2.23* 2.09* 1.77* 1.69*  CALCIUM 9.5 9.3 8.8* 9.1   GFR: Estimated Creatinine Clearance: 64.9 mL/min (A) (by C-G formula based on SCr of 1.69 mg/dL (H)). Liver Function Tests: Recent Labs  Lab 08/02/17 0346  AST 20  ALT 17  ALKPHOS 58  BILITOT 0.9  PROT 6.8  ALBUMIN 3.6   No results for input(s): LIPASE, AMYLASE in the last 168 hours. No results for input(s): AMMONIA in the last 168 hours. Coagulation Profile: No results for input(s): INR, PROTIME in the last 168 hours. Cardiac Enzymes: No results for input(s): CKTOTAL, CKMB, CKMBINDEX, TROPONINI in the last 168 hours. BNP (last 3 results) No results for input(s): PROBNP in the last 8760  hours. HbA1C: No results for input(s): HGBA1C in the last 72 hours. CBG: Recent Labs  Lab 08/06/17 2118 08/07/17 0634 08/07/17 2153 08/08/17 0607 08/08/17 1221  GLUCAP 215* 127* 204* 121* 206*   Lipid Profile: No results for input(s): CHOL, HDL, LDLCALC, TRIG, CHOLHDL, LDLDIRECT in the last 72 hours. Thyroid Function Tests: No results for input(s): TSH, T4TOTAL, FREET4, T3FREE, THYROIDAB in the last 72 hours. Anemia Panel: No results for input(s): VITAMINB12, FOLATE, FERRITIN, TIBC, IRON, RETICCTPCT in the last 72 hours. Urine analysis:    Component Value Date/Time   COLORURINE YELLOW 08/02/2017 0509   APPEARANCEUR CLEAR 08/02/2017 0509   LABSPEC 1.024 08/02/2017 0509   PHURINE 6.0 08/02/2017 0509   GLUCOSEU >=500 (A) 08/02/2017 0509   GLUCOSEU >=1000 (A) 06/02/2016 0844   HGBUR SMALL (A) 08/02/2017 0509   BILIRUBINUR NEGATIVE 08/02/2017 0509   KETONESUR 20 (A) 08/02/2017 0509   PROTEINUR 30 (A) 08/02/2017 0509   UROBILINOGEN 0.2 06/02/2016 0844   NITRITE NEGATIVE 08/02/2017 0509   LEUKOCYTESUR NEGATIVE 08/02/2017 0509   Sepsis Labs: @LABRCNTIP (procalcitonin:4,lacticidven:4)  )No results found for this or any previous visit (  from the past 240 hour(s)).    Studies: No results found.  Scheduled Meds: . amoxicillin-clavulanate  1 tablet Oral TID  . atorvastatin  10 mg Oral Daily  . buPROPion  300 mg Oral Daily  . dextrose  25 g Intravenous Once  . divalproex  1,500 mg Oral QHS  . doxycycline  100 mg Oral Q12H  . enoxaparin (LOVENOX) injection  40 mg Subcutaneous Q24H  . feeding supplement (ENSURE ENLIVE)  237 mL Oral BID BM  . ibuprofen  400 mg Oral Once  . insulin glargine  25 Units Subcutaneous Q2200  . QUEtiapine  400 mg Oral QHS  . sodium chloride flush  3 mL Intravenous Q12H  . tadalafil  5 mg Oral q morning - 10a  . Tdap  0.5 mL Intramuscular Once  . traZODone  150 mg Oral QHS    Continuous Infusions: . sodium chloride 75 mL/hr at 08/05/17 0549  .  calcium gluconate       LOS: 5 days     Phillips Climes, MD [facility] Pager [phone]  If 7PM-7AM, please contact night-coverage [email or web address] Password [email or web address] 08/08/2017, 1:03 PM

## 2017-08-08 NOTE — Progress Notes (Signed)
During assessment skin tear on left posterior foot. Skin foam applied. No drainage. Pt states he had been rubbing his foot. against his boot.

## 2017-08-09 LAB — CREATININE, SERUM
CREATININE: 1.57 mg/dL — AB (ref 0.61–1.24)
GFR calc Af Amer: 58 mL/min — ABNORMAL LOW (ref >60)
GFR, EST NON AFRICAN AMERICAN: 50 mL/min — AB (ref >60)

## 2017-08-09 LAB — GLUCOSE, CAPILLARY
GLUCOSE-CAPILLARY: 219 mg/dL — AB (ref 65–99)
Glucose-Capillary: 167 mg/dL — ABNORMAL HIGH (ref 65–99)
Glucose-Capillary: 261 mg/dL — ABNORMAL HIGH (ref 65–99)
Glucose-Capillary: 280 mg/dL — ABNORMAL HIGH (ref 65–99)

## 2017-08-09 MED ORDER — INSULIN ASPART 100 UNIT/ML ~~LOC~~ SOLN
0.0000 [IU] | Freq: Every day | SUBCUTANEOUS | Status: DC
  Administered 2017-08-09: 3 [IU] via SUBCUTANEOUS

## 2017-08-09 MED ORDER — INSULIN ASPART 100 UNIT/ML ~~LOC~~ SOLN
0.0000 [IU] | Freq: Three times a day (TID) | SUBCUTANEOUS | Status: DC
  Administered 2017-08-10: 8 [IU] via SUBCUTANEOUS
  Administered 2017-08-10: 2 [IU] via SUBCUTANEOUS
  Administered 2017-08-10 – 2017-08-11 (×3): 5 [IU] via SUBCUTANEOUS
  Administered 2017-08-11: 2 [IU] via SUBCUTANEOUS

## 2017-08-09 NOTE — Progress Notes (Signed)
PROGRESS NOTE  [name] WCB:[id number] DOB: [birth year] DOA: 08/02/2017 PCP: [name], MD  HPI/Recap of past 24 hours: [name] is a 52 y.o. male past medical history significant for CKD, suicide attempt, hypertension who presents to the emergency room with lethargy.  No reason for lethargy given.  Patient denies.  Sister says patient's memory is faulty.  Both agree that patient likely does fail as he usually does.  Patient has high history of falls due to his Parkinson's.  Commonly falls or hurts himself.  Recently fell hit his head and got a black eye.  Denies any SI or HI. Imaging shows multiple fractures to the lower extremities.  Head and neck cleared by EDP.  Hospitalist for admission.  08/03/2017: Patient seen and examined at his bedside.  He reports 5-6 out of 10 pain in his left ankle prior to receiving his pain medication.  Was seen by orthopedic surgery on presentation with recommendation to follow-up in 2 weeks, nonweightbearing on the left lower extremity and anticipate weightbearing in about 4 weeks.  Patient is in a fracture boot.   08/04/2017 patient seen and examined at his bedside.  He reports feeling chronically depressed.  Denies suicidal ideation or homicidal ideation.  08/05/2017: Patient seen.  No new complaints.  Patient is awaiting disposition.  No fever or chills, no chest pain, no shortness of breath.  08/06/2017: Patient seen.  No new complaints.  Patient is awaiting disposition.  No fever chills, no chest pain, no shortness of breath.  08/07/2017: Patient seen.  No new complaints.  Kindly pursue disposition.    Subjective: No complaints today, no fever, no chills, no nausea or vomiting  Assessment/Plan: Active Problems:   Schizoaffective disorder, bipolar type (HCC)   Essential hypertension   Diabetes type 2, uncontrolled (HCC)   Degenerative joint disease (DJD) of hip   Dyslipidemia   Chronic kidney disease   Fall   Dementia, early onset with  advanced brain atrophy for age   Diabetic neuropathy ([place])   Closed displaced fracture of lateral malleolus of left fibula   Generalized weakness/fatigue/physical debility:  Slowly improving TSH and vitamin B12 normal Psych consulted Pursue disposition.  Right plantar region fifth toe laceration secondary to fall: Orthopedic surgery consulted, Dr. Marlou Sa.  Highly appreciated. Complete course of oral antibiotics.    Left ankle fracture secondary to mechanical fall: Orthopedic surgery consulted recommended to continue wearing fracture boot follow-up in 2 weeks and nonweightbearing for 4 weeks. Pain management in place Bowel regimen in place PT recommends SNF Fall precautions Pursue disposition  AKI on CKD 3, improving Back to baseline at 1.6 Creatinine on admission 2.09 Avoidnephrotoxic agents/hypotension/dehydration  Left dorsal foot cellulitis Continue Augmentin and doxycycline  No systemic sign of infection, afebrile and no leukocytosis Monitor fever curve No leukocytosis  Ambulatory dysfunction with frequent falls, unclear etiology PT following and recommends SNF Fall precautions in place  Chronic thrombocytopenia, stable Platelet 86 from 75 No sign of overt bleeding  Type 2 diabetes, uncontrolled due to hyperglycemia Continue insulin sliding scale Last hemoglobin A1c 8.2 from 05/27/2017  Bipolar disorder/depression/anxiety Denies suicidal ideation or homicidal ideation Continue home medications  History of seizures Continue home medications  DVT prophylaxis: subcutaneous Lovenox  Code Status: Full code  Family Communication: none at bedside  Disposition Plan:cleared for discharge, but awaiting bed availability   Consultants: Orthopedic surgery.  Procedures: None  Antimicrobials:  Oral doxycycline and p.o. Augmentin     Objective: Vitals:   08/07/17 1959  08/08/17 0605 08/08/17 1953 08/09/17 0453  BP: 112/70 125/75 117/75 107/73  Pulse: 71 72  77 81  Resp:      Temp: 98.3 F (36.8 C) 98.3 F (36.8 C) 98.5 F (36.9 C) 98.1 F (36.7 C)  TempSrc: Oral Oral Oral Oral  SpO2: 96% 97% 98% 96%  Weight:    91.4 kg (201 lb 8 oz)  Height:        Intake/Output Summary (Last 24 hours) at 08/09/2017 1314 Last data filed at 08/09/2017 0600 Gross per 24 hour  Intake 5235 ml  Output 750 ml  Net 4485 ml   Filed Weights   08/03/17 1000 08/09/17 0453  Weight: 91 kg (200 lb 9.9 oz) 91.4 kg (201 lb 8 oz)    Exam: 08/04/2017  Awake alert oriented 3, laying in bed to drop and stress Good air entry bilateraly,rectal auscultation, no wheezing Regular rate and rhythm, normal gallops abdomen soft, nontender, nondistended, bowel sounds present Right lower extremity with good pulses, no cyanosis, left lower extremity in fracture boot    Data Reviewed: CBC: Recent Labs  Lab 08/03/17 0611 08/04/17 0611  WBC 5.7 4.7  HGB 11.7* 10.8*  HCT 33.1* 31.4*  MCV 88.0 85.8  PLT 75* 86*   Basic Metabolic Panel: Recent Labs  Lab 08/03/17 0611 08/04/17 0611 08/09/17 0608  NA 139 139  --   K 3.9 3.8  --   CL 104 105  --   CO2 25 26  --   GLUCOSE 126* 139*  --   BUN 20 24*  --   CREATININE 1.77* 1.69* 1.57*  CALCIUM 8.8* 9.1  --    GFR: Estimated Creatinine Clearance: 69.9 mL/min (A) (by C-G formula based on SCr of 1.57 mg/dL (H)). Liver Function Tests: No results for input(s): AST, ALT, ALKPHOS, BILITOT, PROT, ALBUMIN in the last 168 hours. No results for input(s): LIPASE, AMYLASE in the last 168 hours. No results for input(s): AMMONIA in the last 168 hours. Coagulation Profile: No results for input(s): INR, PROTIME in the last 168 hours. Cardiac Enzymes: No results for input(s): CKTOTAL, CKMB, CKMBINDEX, TROPONINI in the last 168 hours. BNP (last 3 results) No results for input(s): PROBNP in the last 8760 hours. HbA1C: No results for input(s): HGBA1C in the last 72 hours. CBG: Recent Labs  Lab 08/08/17 1221 08/08/17 1646  08/08/17 1957 08/09/17 0607 08/09/17 1212  GLUCAP 206* 178* 209* 167* 219*   Lipid Profile: No results for input(s): CHOL, HDL, LDLCALC, TRIG, CHOLHDL, LDLDIRECT in the last 72 hours. Thyroid Function Tests: No results for input(s): TSH, T4TOTAL, FREET4, T3FREE, THYROIDAB in the last 72 hours. Anemia Panel: No results for input(s): VITAMINB12, FOLATE, FERRITIN, TIBC, IRON, RETICCTPCT in the last 72 hours. Urine analysis:    Component Value Date/Time   COLORURINE YELLOW 08/02/2017 0509   APPEARANCEUR CLEAR 08/02/2017 0509   LABSPEC 1.024 08/02/2017 0509   PHURINE 6.0 08/02/2017 0509   GLUCOSEU >=500 (A) 08/02/2017 0509   GLUCOSEU >=1000 (A) 06/02/2016 0844   HGBUR SMALL (A) 08/02/2017 0509   BILIRUBINUR NEGATIVE 08/02/2017 0509   KETONESUR 20 (A) 08/02/2017 0509   PROTEINUR 30 (A) 08/02/2017 0509   UROBILINOGEN 0.2 06/02/2016 0844   NITRITE NEGATIVE 08/02/2017 0509   LEUKOCYTESUR NEGATIVE 08/02/2017 0509   Sepsis Labs: @LABRCNTIP (procalcitonin:4,lacticidven:4)  )No results found for this or any previous visit (from the past 240 hour(s)).    Studies: No results found.  Scheduled Meds: . amoxicillin-clavulanate  1 tablet Oral TID  .  atorvastatin  10 mg Oral Daily  . buPROPion  300 mg Oral Daily  . dextrose  25 g Intravenous Once  . divalproex  1,500 mg Oral QHS  . doxycycline  100 mg Oral Q12H  . enoxaparin (LOVENOX) injection  40 mg Subcutaneous Q24H  . feeding supplement (ENSURE ENLIVE)  237 mL Oral BID BM  . ibuprofen  400 mg Oral Once  . insulin glargine  25 Units Subcutaneous Q2200  . QUEtiapine  400 mg Oral QHS  . sodium chloride flush  3 mL Intravenous Q12H  . tadalafil  5 mg Oral q morning - 10a  . Tdap  0.5 mL Intramuscular Once  . traZODone  150 mg Oral QHS    Continuous Infusions: . sodium chloride 75 mL/hr at 08/05/17 0549  . calcium gluconate       LOS: 6 days     Phillips Climes, MD [facility] Pager [phone]  If 7PM-7AM,  please contact night-coverage [email or web address] Password [email or web address] 08/09/2017, 1:14 PM

## 2017-08-10 ENCOUNTER — Ambulatory Visit: Payer: Medicaid Other | Admitting: Physical Therapy

## 2017-08-10 LAB — GLUCOSE, CAPILLARY
GLUCOSE-CAPILLARY: 274 mg/dL — AB (ref 65–99)
GLUCOSE-CAPILLARY: 242 mg/dL — AB (ref 65–99)
Glucose-Capillary: 145 mg/dL — ABNORMAL HIGH (ref 65–99)
Glucose-Capillary: 189 mg/dL — ABNORMAL HIGH (ref 65–99)

## 2017-08-10 NOTE — Social Work (Addendum)
CSW f/u for SNF bed as patient has medicaid and no SNF has offered a SNF bed at this time.  CSW f/u.  2:09pm CSW met with patient and discussed barriers. CS[place] of [place] as patient was not agreeable to same previously.   CSW spoke with [facility] admission staff and they have not offered a SNF bed yet.  CSW f/u for placement.  4:15pm: [facility] are reviewing and will let CSW know if they can make a bed offer.  CSW then received call back from Tammy at [facility] and she confirmed that they have not made decision. CSW discussed barriers and they indicated that they will let CSW know tomorrow morning.   CSW will continue to follow up.  Elissa Hefty, LCSW Clinical Social Worker [phone]

## 2017-08-10 NOTE — Progress Notes (Signed)
Physical Therapy Treatment Patient Details Name: [name] MRN: [id number] DOB: 12/11/1965 Today's Date: 08/10/2017    History of Present Illness [name] is a 52 y.o. male past medical history significant for CKD, suicide attempt, hypertension, peripheral neuropathy, ?early onset dementia vs Parkinson's, bil THR, back surgeries who presents to the emergency room with lethargy.  No reason for lethargy given.  Patient has high history of falls due to peripheral neuropathy and deconditioning.  He was found to have left distal fibula fracture involving left ankle joint, bilateral toe fractures (lt 1st toe sesamoid fx; rt 5th proximal phalanx) along with some lacerations in his left foot with surrounding cellulitis and he was admitted.    PT Comments    Pt able to hop today and keep LLE NWB for 6' before he began having trouble keeping NWB. R shoe donned for protection and cushion to R foot. Would benefit from a post op shoe to decrease motion of 5th toe during ambulation.  Pt with redness top of L foot, was painful when he was standing. BLE's elevated end of session.  PT will continue to follow.   Follow Up Recommendations  SNF;Supervision/Assistance - 24 hour     Equipment Recommendations  Rolling walker with 5" wheels;Wheelchair (measurements PT)(pt is 6'5")    Recommendations for Other Services OT consult     Precautions / Restrictions Precautions Precautions: Fall Required Braces or Orthoses: Other Brace/Splint Other Brace/Splint: left camwalker, R toe fx should be buddy taped, would benefit from post op shoe R foot to decrease motion of toe Restrictions Weight Bearing Restrictions: Yes LLE Weight Bearing: Non weight bearing    Mobility  Bed Mobility Overal bed mobility: Needs Assistance Bed Mobility: Supine to Sit     Supine to sit: Supervision     General bed mobility comments: pt able to sit straight up and lift LLE to slide it off bed.   Transfers Overall  transfer level: Needs assistance Equipment used: Rolling walker (2 wheeled) Transfers: Sit to/from Stand Sit to Stand: Min assist;+2 safety/equipment;From elevated surface         General transfer comment: vc's for keeping LLE off floor  Ambulation/Gait Ambulation/Gait assistance: Min assist;+2 safety/equipment Ambulation Distance (Feet): 6 Feet Assistive device: Rolling walker (2 wheeled) Gait Pattern/deviations: Step-to pattern Gait velocity: decreased Gait velocity interpretation: <1.31 ft/sec, indicative of household ambulator General Gait Details: R shoe donned for cushioning of R foot. Pt able to hop on R foot for 6' before beginning to fatigue and starting to place L foot on floor. +2 for chair behind. Pt with pain top of L foot while up   Stairs             Wheelchair Mobility    Modified Rankin (Stroke Patients Only)       Balance Overall balance assessment: Needs assistance;History of Falls Sitting-balance support: No upper extremity supported;Feet unsupported Sitting balance-Leahy Scale: Good     Standing balance support: Bilateral upper extremity supported;During functional activity Standing balance-Leahy Scale: Poor Standing balance comment: reliant on UE support                            Cognition Arousal/Alertness: Awake/alert Behavior During Therapy: Flat affect Overall Cognitive Status: No family/caregiver present to determine baseline cognitive functioning  General Comments: slow to respond but no confusion noted today      Exercises      General Comments General comments (skin integrity, edema, etc.): pt hoping for discharge soon, tired of hospital      Pertinent Vitals/Pain Pain Assessment: Faces Faces Pain Scale: Hurts even more Pain Location: top of L foot Pain Descriptors / Indicators: Grimacing;Guarding;Sore Pain Intervention(s): Limited activity within patient's  tolerance;Monitored during session    Home Living                      Prior Function            PT Goals (current goals can now be found in the care plan section) Acute Rehab PT Goals Patient Stated Goal: decrease pain PT Goal Formulation: With patient Time For Goal Achievement: 08/16/17 Potential to Achieve Goals: Good Progress towards PT goals: Progressing toward goals    Frequency    Min 3X/week      PT Plan Current plan remains appropriate    Co-evaluation              AM-PAC PT "6 Clicks" Daily Activity  Outcome Measure  Difficulty turning over in bed (including adjusting bedclothes, sheets and blankets)?: None Difficulty moving from lying on back to sitting on the side of the bed? : A Little Difficulty sitting down on and standing up from a chair with arms (e.g., wheelchair, bedside commode, etc,.)?: A Little Help needed moving to and from a bed to chair (including a wheelchair)?: A Little Help needed walking in hospital room?: A Little Help needed climbing 3-5 steps with a railing? : Total 6 Click Score: 17    End of Session Equipment Utilized During Treatment: Gait belt Activity Tolerance: Patient tolerated treatment well Patient left: in chair;with call bell/phone within reach;with chair alarm set;Other (comment)(BLE elevated) Nurse Communication: Mobility status PT Visit Diagnosis: History of falling (Z91.81);Difficulty in walking, not elsewhere classified (R26.2);Pain Pain - Right/Left: Left Pain - part of body: Ankle and joints of foot     Time: 0902-0922 PT Time Calculation (min) (ACUTE ONLY): 20 min  Charges:  $Gait Training: 8-22 mins                    G Codes:       Baylis  Oliver 08/10/2017, 9:45 AM

## 2017-08-10 NOTE — Progress Notes (Signed)
On call hospitalist notified that patient was not on sliding scale insulin as noted in the MD's notes. CBG this evening was 280. Pt started on sliding scale coverage and was also given his scheduled Lantus insulin as well.   Pt has a small wound on the posterior aspect of his left big toe (.5 cm diameter). He states this occurred from the ortho boot yesterday. The area was cleaned and a pad was applied. The boot strap was loosened to decrease the friction to his foot.

## 2017-08-10 NOTE — Progress Notes (Signed)
Inpatient Diabetes Program Recommendations  AACE/ADA: New Consensus Statement on Inpatient Glycemic Control (2015)  Target Ranges:  Prepandial:   less than 140 mg/dL      Peak postprandial:   less than 180 mg/dL (1-2 hours)      Critically ill patients:  140 - 180 mg/dL   Results for PAULO, KEIMIG (MRN [id number]) as of 08/10/2017 12:46  Ref. Range 08/09/2017 06:07 08/09/2017 12:12 08/09/2017 16:29 08/09/2017 21:01  Glucose-Capillary Latest Ref Range: 65 - 99 mg/dL 167 (H) 219 (H) 261 (H) 280 (H)   Results for URIJAH, ARKO (MRN [id number]) as of 08/10/2017 12:46  Ref. Range 08/10/2017 07:00 08/10/2017 12:22  Glucose-Capillary Latest Ref Range: 65 - 99 mg/dL 145 (H) 274 (H)    Home DM Meds: Lantus 25 units QHS  Current Insulin Orders: Lantus 25 units QHS      Novolog Moderate Correction Scale/ SSI (0-15 units) TID AC + HS    MD- Please consider the following in-hospital insulin adjustments:  1. Change diet to Carbohydrate Modified diet (currently ordered as Regular)  2. Start Novolog Meal Coverage: Novolog 4 units TID with meals (hold if pt eats <50% of meal)      --Will follow patient during hospitalization--  Wyn Quaker RN, MSN, CDE Diabetes Coordinator Inpatient Glycemic Control Team Team Pager: [phone] (8a-5p)

## 2017-08-10 NOTE — Progress Notes (Signed)
PROGRESS NOTE  [name] DVV:[id number] DOB: 1965/05/19 DOA: 08/02/2017 PCP: [name], MD  HPI/Recap of past 24 hours: [name] is a 52 y.o. male past medical history significant for CKD, suicide attempt, hypertension who presents to the emergency room with lethargy.  No reason for lethargy given.  Patient denies.  Sister says patient's memory is faulty.  Both agree that patient likely does fail as he usually does.  Patient has high history of falls due to his Parkinson's.  Commonly falls or hurts himself.  Recently fell hit his head and got a black eye.  Denies any SI or HI. Imaging shows multiple fractures to the lower extremities.  Head and neck cleared by EDP.  Hospitalist for admission.  08/03/2017: Patient seen and examined at his bedside.  He reports 5-6 out of 10 pain in his left ankle prior to receiving his pain medication.  Was seen by orthopedic surgery on presentation with recommendation to follow-up in 2 weeks, nonweightbearing on the left lower extremity and anticipate weightbearing in about 4 weeks.  Patient is in a fracture boot.   08/04/2017 patient seen and examined at his bedside.  He reports feeling chronically depressed.  Denies suicidal ideation or homicidal ideation.  08/05/2017: Patient seen.  No new complaints.  Patient is awaiting disposition.  No fever or chills, no chest pain, no shortness of breath.  08/06/2017: Patient seen.  No new complaints.  Patient is awaiting disposition.  No fever chills, no chest pain, no shortness of breath.  08/07/2017: Patient seen.  No new complaints.  Kindly pursue disposition.    Subjective: No complaints today, no fever, no chills, no nausea or vomiting  Assessment/Plan: Active Problems:   Schizoaffective disorder, bipolar type (HCC)   Essential hypertension   Diabetes type 2, uncontrolled (HCC)   Degenerative joint disease (DJD) of hip   Dyslipidemia   Chronic kidney disease   Fall   Dementia, early onset with  advanced brain atrophy for age   Diabetic neuropathy ([place])   Closed displaced fracture of lateral malleolus of left fibula   Generalized weakness/fatigue/physical debility:  - Slowly improving, TSH and vitamin B12 normal, been seen and followed by PT, recommendation for SNF placement  Right plantar region fifth toe laceration secondary to fall: - Orthopedic surgery consulted, Dr. Marlou Sa.  Highly appreciated. - Complete course of oral antibiotics.    Left ankle fracture secondary to mechanical fall: - Orthopedic surgery consulted recommended to continue wearing fracture boot follow-up in 2 weeks and nonweightbearing for 4 weeks. - PT recommends SNF - Fall precautions  AKI on CKD 3, improving - Back to baseline at 1.6 - Creatinine on admission 2.09 - Avoidnephrotoxic agents/hypotension/dehydration  Left dorsal foot cellulitis - Continue Augmentin and doxycycline , likely DC antibiotic in the next 24 hours - No systemic sign of infection, afebrile and no leukocytosis  Ambulatory dysfunction with frequent falls, unclear etiology - PT following and recommends SNF Fall precautions in place  Chronic thrombocytopenia, stable - Platelet 86 from 75 - No sign of overt bleeding  Type 2 diabetes, uncontrolled due to hyperglycemia Continue insulin sliding scale, continue with Lantus Last hemoglobin A1c 8.2 from 05/27/2017  Bipolar disorder/depression/anxiety Denies suicidal ideation or homicidal ideation Continue home medications  History of seizures Continue home medications  DVT prophylaxis: subcutaneous Lovenox  Code Status: Full code  Family Communication: none at bedside  Disposition Plan: cleared for discharge, but awaiting SNF bed availability   Consultants: Orthopedic surgery.  Procedures: None  Antimicrobials:  Oral doxycycline and p.o. Augmentin     Objective: Vitals:   08/09/17 1839 08/09/17 2017 08/10/17 0300 08/10/17 0534  BP: 107/71 113/61  116/80    Pulse: 82 82  74  Resp:  18 18 18   Temp: 98.4 F (36.9 C) 98.2 F (36.8 C)  (!) 97.4 F (36.3 C)  TempSrc: Oral Oral  Oral  SpO2: 97% 97%  97%  Weight:    92 kg (202 lb 13.2 oz)  Height:        Intake/Output Summary (Last 24 hours) at 08/10/2017 0954 Last data filed at 08/10/2017 0734 Gross per 24 hour  Intake 600 ml  Output 1175 ml  Net -575 ml   Filed Weights   08/03/17 1000 08/09/17 0453 08/10/17 0534  Weight: 91 kg (200 lb 9.9 oz) 91.4 kg (201 lb 8 oz) 92 kg (202 lb 13.2 oz)    Exam:   Awake alert oriented 3,Inc. In bed in no apparent distress Good air entry  B/L, CTA , no wheezing RRR,no rubs, murmur,gallops abdomen soft, nontender, nondistended, bowel sounds present Right lower extremity with good pulses, no cyanosis, left lower extremity in fracture boot    Data Reviewed: CBC: Recent Labs  Lab 08/04/17 0611  WBC 4.7  HGB 10.8*  HCT 31.4*  MCV 85.8  PLT 86*   Basic Metabolic Panel: Recent Labs  Lab 08/04/17 0611 08/09/17 0608  NA 139  --   K 3.8  --   CL 105  --   CO2 26  --   GLUCOSE 139*  --   BUN 24*  --   CREATININE 1.69* 1.57*  CALCIUM 9.1  --    GFR: Estimated Creatinine Clearance: 69.9 mL/min (A) (by C-G formula based on SCr of 1.57 mg/dL (H)). Liver Function Tests: No results for input(s): AST, ALT, ALKPHOS, BILITOT, PROT, ALBUMIN in the last 168 hours. No results for input(s): LIPASE, AMYLASE in the last 168 hours. No results for input(s): AMMONIA in the last 168 hours. Coagulation Profile: No results for input(s): INR, PROTIME in the last 168 hours. Cardiac Enzymes: No results for input(s): CKTOTAL, CKMB, CKMBINDEX, TROPONINI in the last 168 hours. BNP (last 3 results) No results for input(s): PROBNP in the last 8760 hours. HbA1C: No results for input(s): HGBA1C in the last 72 hours. CBG: Recent Labs  Lab 08/09/17 0607 08/09/17 1212 08/09/17 1629 08/09/17 2101 08/10/17 0700  GLUCAP 167* 219* 261* 280* 145*   Lipid  Profile: No results for input(s): CHOL, HDL, LDLCALC, TRIG, CHOLHDL, LDLDIRECT in the last 72 hours. Thyroid Function Tests: No results for input(s): TSH, T4TOTAL, FREET4, T3FREE, THYROIDAB in the last 72 hours. Anemia Panel: No results for input(s): VITAMINB12, FOLATE, FERRITIN, TIBC, IRON, RETICCTPCT in the last 72 hours. Urine analysis:    Component Value Date/Time   COLORURINE YELLOW 08/02/2017 0509   APPEARANCEUR CLEAR 08/02/2017 0509   LABSPEC 1.024 08/02/2017 0509   PHURINE 6.0 08/02/2017 0509   GLUCOSEU >=500 (A) 08/02/2017 0509   GLUCOSEU >=1000 (A) 06/02/2016 0844   HGBUR SMALL (A) 08/02/2017 0509   BILIRUBINUR NEGATIVE 08/02/2017 0509   KETONESUR 20 (A) 08/02/2017 0509   PROTEINUR 30 (A) 08/02/2017 0509   UROBILINOGEN 0.2 06/02/2016 0844   NITRITE NEGATIVE 08/02/2017 0509   LEUKOCYTESUR NEGATIVE 08/02/2017 0509   Sepsis Labs: @LABRCNTIP (procalcitonin:4,lacticidven:4)  )No results found for this or any previous visit (from the past 240 hour(s)).    Studies: No results found.  Scheduled Meds: . amoxicillin-clavulanate  1  tablet Oral TID  . atorvastatin  10 mg Oral Daily  . buPROPion  300 mg Oral Daily  . dextrose  25 g Intravenous Once  . divalproex  1,500 mg Oral QHS  . doxycycline  100 mg Oral Q12H  . enoxaparin (LOVENOX) injection  40 mg Subcutaneous Q24H  . feeding supplement (ENSURE ENLIVE)  237 mL Oral BID BM  . ibuprofen  400 mg Oral Once  . insulin aspart  0-15 Units Subcutaneous TID WC  . insulin aspart  0-5 Units Subcutaneous QHS  . insulin glargine  25 Units Subcutaneous Q2200  . QUEtiapine  400 mg Oral QHS  . sodium chloride flush  3 mL Intravenous Q12H  . tadalafil  5 mg Oral q morning - 10a  . Tdap  0.5 mL Intramuscular Once  . traZODone  150 mg Oral QHS    Continuous Infusions: . sodium chloride 75 mL/hr at 08/05/17 0549  . calcium gluconate       LOS: 7 days     Phillips Climes, MD [facility] Pager [phone]  If  7PM-7AM, please contact night-coverage [email or web address] Password [facility] 08/10/2017, 9:54 AM

## 2017-08-11 DIAGNOSIS — R296 Repeated falls: Secondary | ICD-10-CM | POA: Insufficient documentation

## 2017-08-11 DIAGNOSIS — L03116 Cellulitis of left lower limb: Secondary | ICD-10-CM | POA: Insufficient documentation

## 2017-08-11 DIAGNOSIS — G20A1 Parkinson's disease without dyskinesia, without mention of fluctuations: Secondary | ICD-10-CM | POA: Insufficient documentation

## 2017-08-11 DIAGNOSIS — S93106A Unspecified dislocation of unspecified toe(s), initial encounter: Secondary | ICD-10-CM | POA: Insufficient documentation

## 2017-08-11 DIAGNOSIS — L219 Seborrheic dermatitis, unspecified: Secondary | ICD-10-CM | POA: Insufficient documentation

## 2017-08-11 LAB — BASIC METABOLIC PANEL
ANION GAP: 11 (ref 5–15)
BUN: 32 mg/dL — AB (ref 6–20)
CO2: 25 mmol/L (ref 22–32)
Calcium: 9.4 mg/dL (ref 8.9–10.3)
Chloride: 101 mmol/L (ref 101–111)
Creatinine, Ser: 1.53 mg/dL — ABNORMAL HIGH (ref 0.61–1.24)
GFR calc Af Amer: 60 mL/min — ABNORMAL LOW (ref >60)
GFR calc non Af Amer: 52 mL/min — ABNORMAL LOW (ref >60)
Glucose, Bld: 130 mg/dL — ABNORMAL HIGH (ref 65–99)
POTASSIUM: 4.4 mmol/L (ref 3.5–5.1)
Sodium: 137 mmol/L (ref 135–145)

## 2017-08-11 LAB — CBC
HEMATOCRIT: 37.2 % — AB (ref 39.0–52.0)
Hemoglobin: 12.7 g/dL — ABNORMAL LOW (ref 13.0–17.0)
MCH: 30.1 pg (ref 26.0–34.0)
MCHC: 34.1 g/dL (ref 30.0–36.0)
MCV: 88.2 fL (ref 78.0–100.0)
Platelets: 172 10*3/uL (ref 150–400)
RBC: 4.22 MIL/uL (ref 4.22–5.81)
RDW: 13 % (ref 11.5–15.5)
WBC: 7.3 10*3/uL (ref 4.0–10.5)

## 2017-08-11 LAB — GLUCOSE, CAPILLARY
GLUCOSE-CAPILLARY: 211 mg/dL — AB (ref 65–99)
Glucose-Capillary: 129 mg/dL — ABNORMAL HIGH (ref 65–99)
Glucose-Capillary: 237 mg/dL — ABNORMAL HIGH (ref 65–99)

## 2017-08-11 MED ORDER — INSULIN GLARGINE 100 UNIT/ML ~~LOC~~ SOLN: 28.0000 [IU] | Freq: Every day | SUBCUTANEOUS | 11 refills | Status: DC

## 2017-08-11 MED ORDER — INSULIN ASPART 100 UNIT/ML ~~LOC~~ SOLN: 0.0000 [IU] | Freq: Three times a day (TID) | SUBCUTANEOUS | 11 refills | Status: DC

## 2017-08-11 MED ORDER — BACITRACIN ZINC 500 UNIT/GM EX OINT: 1.0000 "application " | TOPICAL_OINTMENT | Freq: Three times a day (TID) | CUTANEOUS | 0 refills | Status: DC

## 2017-08-11 MED ORDER — ACETAMINOPHEN 325 MG PO TABS: 650.0000 mg | ORAL_TABLET | Freq: Four times a day (QID) | ORAL | Status: DC | PRN

## 2017-08-11 MED ORDER — TRAMADOL HCL 50 MG PO TABS: 50.0000 mg | ORAL_TABLET | Freq: Two times a day (BID) | ORAL | 0 refills | Status: DC | PRN

## 2017-08-11 MED ORDER — INSULIN GLARGINE 100 UNIT/ML ~~LOC~~ SOLN
28.0000 [IU] | Freq: Every day | SUBCUTANEOUS | Status: DC
  Filled 2017-08-11: qty 0.28

## 2017-08-11 MED ORDER — ENSURE ENLIVE PO LIQD: 237.0000 mL | Freq: Two times a day (BID) | ORAL | 12 refills | Status: DC

## 2017-08-11 MED ORDER — BACITRACIN ZINC 500 UNIT/GM EX OINT
1.0000 "application " | TOPICAL_OINTMENT | Freq: Three times a day (TID) | CUTANEOUS | Status: DC
  Administered 2017-08-11 (×2): 1 via TOPICAL
  Filled 2017-08-11: qty 28.35

## 2017-08-11 NOTE — Social Work (Signed)
Clinical Social Worker facilitated patient discharge including contacting patient family and facility to confirm patient discharge plans.  Clinical information faxed to facility and family agreeable with plan.    CSW arranged ambulance transport via [facility] to [facility] and [place] .    RN to call [phone] to give report prior to discharge.  Clinical Social Worker will sign off for now as social work intervention is no longer needed. Please consult [place] again if new need arises.  Elissa Hefty, LCSW Clinical Social Worker [phone]

## 2017-08-11 NOTE — Social Work (Addendum)
CSW was advised by [facility] and [facility]) that they are not going to offer a SNF bed.  CSW f/u with SNF-[facility].  [place] declined to offer a SNF bed.  8:30: Clin Asst Dir Zack indicated that he will make patient a difficult to place and assist with SNF placement at [facility].  CSW will f/uif or bed availability.  10:00am CSW spoke with hospital liaison for [facility] who met with patient at bedside. She is going to follow up with her administration for SNF placement.   CSW will f/u and await placement for SNF's either at [facility] and/or [facility].  Elissa Hefty, LCSW Clinical Social Worker [phone]

## 2017-08-11 NOTE — Progress Notes (Addendum)
PROGRESS NOTE  [name] IZT:[id number] DOB: [birth year] DOA: 08/02/2017 PCP: [name], MD  HPI/Recap of past 24 hours: [name] is a 52 y.o. male past medical history significant for CKD, suicide attempt, hypertension who presents to the emergency room with lethargy.  No reason for lethargy given.  Patient denies.  Sister says patient's memory is faulty.  Both agree that patient likely does fail as he usually does.  Patient has high history of falls due to his Parkinson's.  Commonly falls or hurts himself.  Recently fell hit his head and got a black eye.  Denies any SI or HI. Imaging shows multiple fractures to the lower extremities.  Head and neck cleared by EDP.  Hospitalist for admission.  Subjective: No complaints today, no fever, no chills, no nausea or vomiting  Assessment/Plan: Active Problems:   Schizoaffective disorder, bipolar type (HCC)   Essential hypertension   Diabetes type 2, uncontrolled (HCC)   Degenerative joint disease (DJD) of hip   Dyslipidemia   Chronic kidney disease   Fall   Dementia, early onset with advanced brain atrophy for age   Diabetic neuropathy ([place])   Closed displaced fracture of lateral malleolus of left fibula   Generalized weakness/fatigue/physical debility:  - Slowly improving, TSH and vitamin B12 normal, been seen and followed by PT, recommendation for SNF placement, awaiting bed availability  Right plantar region fifth toe laceration secondary to fall: - Orthopedic surgery consulted, Dr. Marlou Sa.  Highly appreciated. - completed oral course of antibiotics, continue local wound care, with topical bacitracin  Left ankle fracture secondary to mechanical fall: - Orthopedic surgery consulted recommended to continue wearing fracture boot follow-up in 2 weeks and nonweightbearing for 4 weeks. - PT recommends SNF - Fall precautions  AKI on CKD 3, improving - Back to baseline at 1.6 - Creatinine on admission 2.09 - Avoidnephrotoxic  agents/hypotension/dehydration  Left dorsal foot cellulitis - treated with Augmentin and doxycycline, no further cellulitis, - No systemic sign of infection, afebrile and no leukocytosis  Ambulatory dysfunction with frequent falls, unclear etiology - PT following and recommends SNF Fall precautions in place  Chronic thrombocytopenia, stable - Improving, it is 172 today, no evidence of bleed  Type 2 diabetes, uncontrolled due to hyperglycemia - Continue insulin sliding scale,slightly uncontrolled, would increase Lantus from 25-28 units Last hemoglobin A1c 8.2 from 05/27/2017  Bipolar disorder/depression/anxiety Denies suicidal ideation or homicidal ideation Continue home medications  History of seizures Continue home medications  DVT prophylaxis: subcutaneous Lovenox  Code Status: Full code  Family Communication: none at bedside  Disposition Plan: cleared for discharge, but awaiting SNF bed availability   Consultants: Orthopedic surgery.  Procedures: None  Antimicrobials:  Oral doxycycline and p.o. Augmentin  stop 08/11/2017     Objective: Vitals:   08/10/17 0534 08/10/17 1225 08/10/17 2042 08/11/17 0459  BP: 116/80 105/80 110/76 106/72  Pulse: 74 80 89 76  Resp: 18 14 16 14   Temp: (!) 97.4 F (36.3 C) 98.2 F (36.8 C) 98 F (36.7 C) 97.9 F (36.6 C)  TempSrc: Oral Oral Oral Oral  SpO2: 97% 97% 100% 97%  Weight: 92 kg (202 lb 13.2 oz)     Height:        Intake/Output Summary (Last 24 hours) at 08/11/2017 1033 Last data filed at 08/11/2017 0900 Gross per 24 hour  Intake 720 ml  Output 1000 ml  Net -280 ml   Filed Weights   08/03/17 1000 08/09/17 0453 08/10/17 0534  Weight: 91  kg (200 lb 9.9 oz) 91.4 kg (201 lb 8 oz) 92 kg (202 lb 13.2 oz)    Exam:   Awake alert oriented 3,Inc. In bed in no apparent distress Good air entry  B/L, CTA , no wheezing RRR,no rubs, murmur,gallops abdomen soft, nontender, nondistended, bowel sounds present Right  lower extremity with good pulses, no cyanosis,right fifth digit wound healed, left lower extremity in fracture boot, some bruising, but no cellulitis    Data Reviewed: CBC: Recent Labs  Lab 08/11/17 0615  WBC 7.3  HGB 12.7*  HCT 37.2*  MCV 88.2  PLT 151   Basic Metabolic Panel: Recent Labs  Lab 08/09/17 0608 08/11/17 0615  NA  --  137  K  --  4.4  CL  --  101  CO2  --  25  GLUCOSE  --  130*  BUN  --  32*  CREATININE 1.57* 1.53*  CALCIUM  --  9.4   GFR: Estimated Creatinine Clearance: 71.7 mL/min (A) (by C-G formula based on SCr of 1.53 mg/dL (H)). Liver Function Tests: No results for input(s): AST, ALT, ALKPHOS, BILITOT, PROT, ALBUMIN in the last 168 hours. No results for input(s): LIPASE, AMYLASE in the last 168 hours. No results for input(s): AMMONIA in the last 168 hours. Coagulation Profile: No results for input(s): INR, PROTIME in the last 168 hours. Cardiac Enzymes: No results for input(s): CKTOTAL, CKMB, CKMBINDEX, TROPONINI in the last 168 hours. BNP (last 3 results) No results for input(s): PROBNP in the last 8760 hours. HbA1C: No results for input(s): HGBA1C in the last 72 hours. CBG: Recent Labs  Lab 08/10/17 0700 08/10/17 1222 08/10/17 1647 08/10/17 2125 08/11/17 0615  GLUCAP 145* 274* 242* 189* 129*   Lipid Profile: No results for input(s): CHOL, HDL, LDLCALC, TRIG, CHOLHDL, LDLDIRECT in the last 72 hours. Thyroid Function Tests: No results for input(s): TSH, T4TOTAL, FREET4, T3FREE, THYROIDAB in the last 72 hours. Anemia Panel: No results for input(s): VITAMINB12, FOLATE, FERRITIN, TIBC, IRON, RETICCTPCT in the last 72 hours. Urine analysis:    Component Value Date/Time   COLORURINE YELLOW 08/02/2017 0509   APPEARANCEUR CLEAR 08/02/2017 0509   LABSPEC 1.024 08/02/2017 0509   PHURINE 6.0 08/02/2017 0509   GLUCOSEU >=500 (A) 08/02/2017 0509   GLUCOSEU >=1000 (A) 06/02/2016 0844   HGBUR SMALL (A) 08/02/2017 0509   BILIRUBINUR NEGATIVE  08/02/2017 0509   KETONESUR 20 (A) 08/02/2017 0509   PROTEINUR 30 (A) 08/02/2017 0509   UROBILINOGEN 0.2 06/02/2016 0844   NITRITE NEGATIVE 08/02/2017 0509   LEUKOCYTESUR NEGATIVE 08/02/2017 0509   Sepsis Labs: @LABRCNTIP (procalcitonin:4,lacticidven:4)  )No results found for this or any previous visit (from the past 240 hour(s)).    Studies: No results found.  Scheduled Meds: . atorvastatin  10 mg Oral Daily  . bacitracin  1 application Topical TID  . buPROPion  300 mg Oral Daily  . dextrose  25 g Intravenous Once  . divalproex  1,500 mg Oral QHS  . enoxaparin (LOVENOX) injection  40 mg Subcutaneous Q24H  . feeding supplement (ENSURE ENLIVE)  237 mL Oral BID BM  . ibuprofen  400 mg Oral Once  . insulin aspart  0-15 Units Subcutaneous TID WC  . insulin aspart  0-5 Units Subcutaneous QHS  . insulin glargine  25 Units Subcutaneous Q2200  . QUEtiapine  400 mg Oral QHS  . sodium chloride flush  3 mL Intravenous Q12H  . tadalafil  5 mg Oral q morning - 10a  . Tdap  0.5 mL Intramuscular Once  . traZODone  150 mg Oral QHS    Continuous Infusions: . sodium chloride 75 mL/hr at 08/05/17 0549  . calcium gluconate       LOS: 8 days     Phillips Climes, MD [facility] Pager [phone]  If 7PM-7AM, please contact night-coverage [email or web address] Password [facility] 08/11/2017, 10:33 AM

## 2017-08-11 NOTE — Progress Notes (Signed)
All questions and concerns addressed. Called facility, gave report to nurse Delayne, Pt to discharge to facility via [facility].

## 2017-08-11 NOTE — Plan of Care (Signed)
  Problem: Safety: Goal: Ability to remain free from injury will improve Outcome: Progressing   Problem: Skin Integrity: Goal: Risk for impaired skin integrity will decrease Outcome: Progressing   Problem: Clinical Measurements: Goal: Ability to avoid or minimize complications of infection will improve Outcome: Progressing   Problem: Skin Integrity: Goal: Skin integrity will improve Outcome: Progressing

## 2017-08-11 NOTE — Discharge Instructions (Signed)
Follow with Primary MD [name], MD or SNF physician and 3 days  Get CBC, CMP,checked  by Primary MD next visit.    Disposition SNF   Diet: Regular diet , with feeding assistance and aspiration precautions.  For Heart failure patients - Check your Weight same time everyday, if you gain over 2 pounds, or you develop in leg swelling, experience more shortness of breath or chest pain, call your Primary MD immediately. Follow Cardiac Low Salt Diet and 1.5 lit/day fluid restriction.   On your next visit with your primary care physician please Get Medicines reviewed and adjusted.   Please request your Prim.MD to go over all Hospital Tests and Procedure/Radiological results at the follow up, please get all Hospital records sent to your Prim MD by signing hospital release before you go home.   If you experience worsening of your admission symptoms, develop shortness of breath, life threatening emergency, suicidal or homicidal thoughts you must seek medical attention immediately by calling 911 or calling your MD immediately  if symptoms less severe.  You Must read complete instructions/literature along with all the possible adverse reactions/side effects for all the Medicines you take and that have been prescribed to you. Take any new Medicines after you have completely understood and accpet all the possible adverse reactions/side effects.   Do not drive, operating heavy machinery, perform activities at heights, swimming or participation in water activities or provide baby sitting services if your were admitted for syncope or siezures until you have seen by Primary MD or a Neurologist and advised to do so again.  Do not drive when taking Pain medications.    Do not take more than prescribed Pain, Sleep and Anxiety Medications  Special Instructions: If you have smoked or chewed Tobacco  in the last 2 yrs please stop smoking, stop any regular Alcohol  and or any Recreational drug  use.  Wear Seat belts while driving.   Please note  You were cared for by a hospitalist during your hospital stay. If you have any questions about your discharge medications or the care you received while you were in the hospital after you are discharged, you can call the unit and asked to speak with the hospitalist on call if the hospitalist that took care of you is not available. Once you are discharged, your primary care physician will handle any further medical issues. Please note that NO REFILLS for any discharge medications will be authorized once you are discharged, as it is imperative that you return to your primary care physician (or establish a relationship with a primary care physician if you do not have one) for your aftercare needs so that they can reassess your need for medications and monitor your lab values.

## 2017-08-11 NOTE — Social Work (Signed)
CSW was advised by SNF-[facility] and they will accept patient for rehabilitative therapies.   CSW met with patient at bedside and advised of acceptance by SNF. Pt in agreement and willing to [facility].  CSW f/u for disposition.   , LCSW Clinical Social Worker [phone]   

## 2017-08-11 NOTE — Clinical Social Work Placement (Signed)
   CLINICAL SOCIAL WORK PLACEMENT  NOTE  Date:  08/11/2017  Patient Details  Name: [name] MRN: [id number] Date of Birth: 10-25-1965  Clinical Social Work is seeking post-discharge placement for this patient at the [place] level of care (*CSW will initial, date and re-position this form in  chart as items are completed):  Yes   Patient/family provided with [place] Work Department's list of facilities offering this level of care within the geographic area requested by the patient (or if unable, by the patient's family).  Yes   Patient/family informed of their freedom to choose among providers that offer the needed level of care, that participate in Medicare, Medicaid or managed care program needed by the patient, have an available bed and are willing to accept the patient.  Yes   Patient/family informed of [facility]'s ownership interest in [facility] and [facility], as well as of the fact that they are under no obligation to receive care at these facilities.  PASRR submitted to EDS on       PASRR number received on 08/05/17     Existing PASRR number confirmed on       FL2 transmitted to all facilities in geographic area requested by pt/family on 08/04/17     FL2 transmitted to all facilities within larger geographic area on       Patient informed that his/her managed care company has contracts with or will negotiate with certain facilities, including the following:        Yes   Patient/family informed of bed offers received.  Patient chooses bed at [facility] and [place] recommends and patient chooses bed at      Patient to be transferred to   on 08/11/17.  Patient to be transferred to facility by [facility]     Patient family notified on 08/11/17 of transfer.  Name of family member notified:  sister [name] contacted     PHYSICIAN       Additional Comment:     _______________________________________________ Normajean Baxter, LCSW 08/11/2017, 3:56 PM

## 2017-08-11 NOTE — Discharge Summary (Signed)
[name], is a 52 y.o. male  DOB August 06, 1965  MRN [id number].  Admission date:  08/02/2017  Admitting Physician  Elwin Mocha, MD  Discharge Date:  08/11/2017   Primary MD  [name], MD  Recommendations for primary care physician for things to follow:  -Check CBC, BMP in  1 week   Admission Diagnosis  Hyperkalemia [E87.5] Cellulitis of left lower extremity [R44.315] Fall, initial encounter [W19.XXXA] Closed dislocation of great toe of left foot, initial encounter [S93.105A] Closed fracture of distal end of left fibula, unspecified fracture morphology, initial encounter [S82.832A] Laceration of lesser toe of right foot without foreign body present or damage to nail, initial encounter [S91.114A]   Discharge Diagnosis  Hyperkalemia [E87.5] Cellulitis of left lower extremity [Q00.867] Fall, initial encounter [W19.XXXA] Closed dislocation of great toe of left foot, initial encounter [S93.105A] Closed fracture of distal end of left fibula, unspecified fracture morphology, initial encounter [S82.832A] Laceration of lesser toe of right foot without foreign body present or damage to nail, initial encounter [S91.114A]   Active Problems:   Schizoaffective disorder, bipolar type ([place])   Essential hypertension   Diabetes type 2, uncontrolled (HCC)   Degenerative joint disease (DJD) of hip   Dyslipidemia   Chronic kidney disease   Fall   Dementia, early onset with advanced brain atrophy for age   Diabetic neuropathy ([place])   Closed displaced fracture of lateral malleolus of left fibula      Past Medical History:  Diagnosis Date  . ADD (attention deficit disorder)   . Anxiety   . Arthritis    right hip  . Bipolar 1 disorder ([place])   . Blood in urine   . CKD (chronic kidney disease), stage III ([place])   . Creatinine elevation   . Dementia    "early onset" (08/04/2017)  . Depression    bipolar [facility]  . Diabetes mellitus without complication ([place])   . Family history of anesthesia complication    pt is unsure , but pt father may have been difficult to arouse   . HCAP (healthcare-associated pneumonia) 10/31/2012  . History of kidney stones   . Hypertension   . Hypogonadism male   . Liver fatty degeneration   . Microscopic hematuria    hereditary s/p Urology eval  . Osteoarthritis of right hip 11/28/2011   2012 2015 s/p THR Severe  Dr Novella Olive    . Pleural effusion 11/02/2012  . Pneumonia 10-2012  . Pneumonia, organism unspecified([place]) 11/02/2012  . Polysubstance dependence, non-opioid, in remission ([place])    remote  . Primary osteoarthritis of left hip 05/22/2015  . PTSD (post-traumatic stress disorder)    SOCIAL ANXIETY DISORDER   . Suicide attempt by multiple drug overdose ([place]) January 20, 2016   Grieving his cat's death July 10, 2015    Past Surgical History:  Procedure Laterality Date  . BACK SURGERY    . CLOSED REDUCTION METACARPAL WITH PERCUTANEOUS PINNING Right   . LUMBAR DISC SURGERY    . TONSILLECTOMY    . TOTAL  HIP ARTHROPLASTY Right 08/16/2013   Procedure: TOTAL HIP ARTHROPLASTY ANTERIOR APPROACH;  Surgeon: Hessie Dibble, MD;  Location: [facility];  Service: Orthopedics;  Laterality: Right;  . TOTAL HIP ARTHROPLASTY Left 05/22/2015   Procedure: TOTAL HIP ARTHROPLASTY ANTERIOR APPROACH;  Surgeon: Melrose Nakayama, MD;  Location: [facility];  Service: Orthopedics;  Laterality: Left;       History of present illness and  Hospital Course:     Kindly see H&P for history of present illness and admission details, please review complete Labs, Consult reports and Test reports for all details in brief  HPI  from the history and physical done on the day of admission HPI: [name] is a 52 y.o. male past medical history significant for CKD, suicide attempt, hypertension who presents to the emergency room with lethargy.  No reason for lethargy given.  Patient denies.  Sister  says patient's memory is faulty.  Both agree that patient likely does fail as he usually does.  Patient has high history of falls due to his Parkinson's.  Commonly falls or hurts himself.  Recently fell hit his head and got a black eye.  Denies any SI or HI.  Course: Imaging shows multiple fractures to the lower extremities.  Head and neck cleared by EDP.  Hospitalist consulted for admission.       Hospital Course   [name] a 52 y.o.malepast medical history significant for CKD, suicide attempt, hypertension who presents to the emergency room with lethargy.No reason for lethargy given. Patient denies. Sister says patient's memory is faulty. Both agree that patient likely does fail as he usually does. Patient has high history of falls due to his Parkinson's. Commonly falls or hurts himself. Recently fell hit his head and got a black eye. Denies any SI or HI. Imaging shows multiple fractures to the lower extremities. Head and neck cleared by EDP. Hospitalist for admission.  Generalized weakness/fatigue/physical debility:  - Slowly improving, TSH and vitamin B12 normal, been seen and followed by PT, recommendation for SNF placement,   Right plantar region fifth toe laceration secondary to fall: - Orthopedic surgery consulted, Dr. Marlou Sa.  Highly appreciated. - completed oral course of antibiotics, continue local wound care, with topical bacitracin  Left ankle fracture secondary to mechanical fall: - Orthopedic surgery consulted recommended to continue wearing fracture boot follow-up in 2 weeks and nonweightbearing for 4 weeks. - PT recommends SNF - Fall precautions  AKI on CKD 3, improving - Back to baseline at 1.6 - Creatinine on admission 2.09 - Avoidnephrotoxic agents/hypotension/dehydration  Left dorsal foot cellulitis - treated with Augmentin and doxycycline, no further cellulitis, - No systemic sign of infection, afebrile and no leukocytosis  Ambulatory  dysfunction with frequent falls, unclear etiology - PT following and recommends SNF Fall precautions in place  Chronic thrombocytopenia, stable - Improving, it is 172 today, no evidence of bleed  Type 2 diabetes, uncontrolled due to hyperglycemia - Continue insulin sliding scale,slightly uncontrolled, would increase Lantus from 25-28 units Last hemoglobin A1c 8.2 from 05/27/2017  Bipolar disorder/depression/anxiety Denies suicidal ideation or homicidal ideation Continue home medications  History of seizures Continue home medications     Discharge Condition:  Stable     Discharge Instructions  and  Discharge Medications     Discharge Instructions    Discharge instructions   Complete by:  As directed    Follow with Primary MD [name], MD or SNF physician and 3 days  Get CBC, CMP,checked  by Primary  MD next visit.    Disposition SNF   Diet: Regular diet , with feeding assistance and aspiration precautions.  For Heart failure patients - Check your Weight same time everyday, if you gain over 2 pounds, or you develop in leg swelling, experience more shortness of breath or chest pain, call your Primary MD immediately. Follow Cardiac Low Salt Diet and 1.5 lit/day fluid restriction.   On your next visit with your primary care physician please Get Medicines reviewed and adjusted.   Please request your Prim.MD to go over all Hospital Tests and Procedure/Radiological results at the follow up, please get all Hospital records sent to your Prim MD by signing hospital release before you go home.   If you experience worsening of your admission symptoms, develop shortness of breath, life threatening emergency, suicidal or homicidal thoughts you must seek medical attention immediately by calling 911 or calling your MD immediately  if symptoms less severe.  You Must read complete instructions/literature along with all the possible adverse reactions/side effects for all  the Medicines you take and that have been prescribed to you. Take any new Medicines after you have completely understood and accpet all the possible adverse reactions/side effects.   Do not drive, operating heavy machinery, perform activities at heights, swimming or participation in water activities or provide baby sitting services if your were admitted for syncope or siezures until you have seen by Primary MD or a Neurologist and advised to do so again.  Do not drive when taking Pain medications.    Do not take more than prescribed Pain, Sleep and Anxiety Medications  Special Instructions: If you have smoked or chewed Tobacco  in the last 2 yrs please stop smoking, stop any regular Alcohol  and or any Recreational drug use.  Wear Seat belts while driving.   Please note  You were cared for by a hospitalist during your hospital stay. If you have any questions about your discharge medications or the care you received while you were in the hospital after you are discharged, you can call the unit and asked to speak with the hospitalist on call if the hospitalist that took care of you is not available. Once you are discharged, your primary care physician will handle any further medical issues. Please note that NO REFILLS for any discharge medications will be authorized once you are discharged, as it is imperative that you return to your primary care physician (or establish a relationship with a primary care physician if you do not have one) for your aftercare needs so that they can reassess your need for medications and monitor your lab values.   Increase activity slowly   Complete by:  As directed      Allergies as of 08/11/2017      Reactions   Vicodin [hydrocodone-acetaminophen] Itching      Medication List    STOP taking these medications   ACCU-CHEK AVIVA device   ACCU-CHEK GUIDE w/Device Kit   ACCU-CHEK SOFTCLIX LANCET DEV Kit   glucose blood test strip Commonly known as:   ACCU-CHEK AVIVA   Insulin Glargine 100 UNIT/ML Solostar Pen Commonly known as:  LANTUS Replaced by:  insulin glargine 100 UNIT/ML injection   Insulin Pen Needle 31G X 5 MM Misc Commonly known as:  TRUEPLUS PEN NEEDLES   methocarbamol 500 MG tablet Commonly known as:  ROBAXIN     TAKE these medications   acetaminophen 325 MG tablet Commonly known as:  TYLENOL Take 2 tablets (650 mg  total) by mouth every 6 (six) hours as needed for mild pain (or Fever >/= 101).   atorvastatin 10 MG tablet Commonly known as:  LIPITOR Take 1 tablet (10 mg total) by mouth daily.   bacitracin ointment Apply 1 application topically 3 (three) times daily. Apply to right fifth toe   buPROPion 300 MG 24 hr tablet Commonly known as:  WELLBUTRIN XL Take 1 tablet (300 mg total) by mouth daily.   diclofenac sodium 1 % Gel Commonly known as:  VOLTAREN Apply 4 g topically 4 (four) times daily. What changed:    when to take this  reasons to take this   divalproex 500 MG DR tablet Commonly known as:  DEPAKOTE Take 3 tablets (1,500 mg total) by mouth at bedtime.   feeding supplement (ENSURE ENLIVE) Liqd Take 237 mLs by mouth 2 (two) times daily between meals.   insulin aspart 100 UNIT/ML injection Commonly known as:  novoLOG Inject 0-15 Units into the skin 3 (three) times daily with meals.   insulin glargine 100 UNIT/ML injection Commonly known as:  LANTUS Inject 0.28 mLs (28 Units total) into the skin daily at 10 pm. Replaces:  Insulin Glargine 100 UNIT/ML Solostar Pen   pregabalin 75 MG capsule Commonly known as:  LYRICA Take 1 capsule (75 mg total) by mouth 2 (two) times daily.   QUEtiapine 400 MG tablet Commonly known as:  SEROQUEL Take 1 tablet (400 mg total) by mouth at bedtime.   tadalafil 5 MG tablet Commonly known as:  CIALIS TAKE 1 TABLET (5 MG TOTAL) BY MOUTH DAILY What changed:    how much to take  how to take this  when to take this  reasons to take  this  additional instructions   Testosterone 20.25 MG/1.25GM (1.62%) Gel Commonly known as:  ANDROGEL Apply 20.25 mg topically every morning. Apply topically under arm pit alternating with each application.   traMADol 50 MG tablet Commonly known as:  ULTRAM TAKE 1 TABLET BY MOUTH AT BEDTIME. What changed:  Another medication with the same name was added. Make sure you understand how and when to take each.   traMADol 50 MG tablet Commonly known as:  ULTRAM Take 1 tablet (50 mg total) by mouth every 12 (twelve) hours as needed for moderate pain or severe pain. What changed:  You were already taking a medication with the same name, and this prescription was added. Make sure you understand how and when to take each.   traZODone 150 MG tablet Commonly known as:  DESYREL Take 1 tablet (150 mg total) by mouth at bedtime.   vitamin B-12 500 MCG tablet Commonly known as:  CYANOCOBALAMIN Take 1 tablet (500 mcg total) by mouth daily.         Diet and Activity recommendation: See Discharge Instructions above   Consultants: Orthopedic surgery.  Procedures: None  Antimicrobials:  Oral doxycycline and p.o. Augmentin  stop 08/11/2017    Major procedures and Radiology Reports - PLEASE review detailed and final reports for all details, in brief -      Dg Tibia/fibula Left  Result Date: 08/02/2017 CLINICAL DATA:  Left leg pain after fall. EXAM: LEFT TIBIA AND FIBULA - 2 VIEW COMPARISON:  None. FINDINGS: Nondisplaced acute closed distal fibular fracture extending into the ankle joint without widening of the ankle mortise is noted. Lateral soft tissue swelling of the distal leg and ankle. Small calcaneal enthesophytes are present. No joint dislocation. Mild degenerative medial femorotibial joint space narrowing of the  knee. IMPRESSION: Acute nondisplaced distal fibular diaphyseal fracture extending into the ankle joint. Overlying soft tissue swelling is noted. Electronically Signed    By: Ashley Royalty M.D.   On: 08/02/2017 03:59   Dg Ankle Complete Left  Result Date: 08/02/2017 CLINICAL DATA:  Left ankle swelling after fall. EXAM: LEFT ANKLE COMPLETE - 3+ VIEW COMPARISON:  None. FINDINGS: There is an acute, closed, oblique intra-articular fracture of the distal fibula extending into the ankle joint. No widening of the ankle mortise. There is soft tissue swelling over the lateral aspect of the included leg and over the lateral malleolus. Small calcaneal enthesophytes are present. The subtalar and midfoot articulations are intact. IMPRESSION: Acute, nondisplaced oblique fracture of the distal fibula extending into the ankle joint. Lateral soft tissue swelling of the included distal leg and ankle. Electronically Signed   By: Ashley Royalty M.D.   On: 08/02/2017 03:49   Ct Head Wo Contrast  Result Date: 08/02/2017 CLINICAL DATA:  Lethargy. Frequent falls tonight. Neck pain. Initial exam. EXAM: CT HEAD WITHOUT CONTRAST CT CERVICAL SPINE WITHOUT CONTRAST TECHNIQUE: Multidetector CT imaging of the head and cervical spine was performed following the standard protocol without intravenous contrast. Multiplanar CT image reconstructions of the cervical spine were also generated. COMPARISON:  Head CT and brain MRI May 2018 FINDINGS: CT HEAD FINDINGS Brain: Stable by age advanced atrophy. No intracranial hemorrhage, mass effect, or midline shift. No hydrocephalus. The basilar cisterns are patent. No evidence of territorial infarct or acute ischemia. No extra-axial or intracranial fluid collection. Vascular: No hyperdense vessel or unexpected calcification. Skull: No fracture or focal lesion. Sinuses/Orbits: Paranasal sinuses and mastoid air cells are clear. The visualized orbits are unremarkable. Other: None. CT CERVICAL SPINE FINDINGS Alignment: Normal. Skull base and vertebrae: No acute fracture. Vertebral body heights are maintained. The dens and skull base are intact. Soft tissues and spinal canal: No  prevertebral fluid or swelling. No visible canal hematoma. Disc levels: Disc space narrowing and endplate spurring at Q9-V6 with large peripherally calcified disc osteophyte complex. This causes narrowing of the spinal canal and bilateral foramina at this level. Lesser endplate spurring at X4-H0 and C4-C5. Upper chest: 2 right thyroid nodules both less than 15 mm, no further evaluation recommended based on size. No acute findings. Other: None. IMPRESSION: 1.  No acute intracranial abnormality. 2. Age advanced cerebral atrophy, stable from May 2018. 3. No acute fracture or subluxation of the cervical spine. 4. Degenerative change in the cervical spine with spinal canal and neural foraminal stenosis at C5-C6. Electronically Signed   By: Jeb Levering M.D.   On: 08/02/2017 03:36   Ct Cervical Spine Wo Contrast  Result Date: 08/02/2017 CLINICAL DATA:  Lethargy. Frequent falls tonight. Neck pain. Initial exam. EXAM: CT HEAD WITHOUT CONTRAST CT CERVICAL SPINE WITHOUT CONTRAST TECHNIQUE: Multidetector CT imaging of the head and cervical spine was performed following the standard protocol without intravenous contrast. Multiplanar CT image reconstructions of the cervical spine were also generated. COMPARISON:  Head CT and brain MRI May 2018 FINDINGS: CT HEAD FINDINGS Brain: Stable by age advanced atrophy. No intracranial hemorrhage, mass effect, or midline shift. No hydrocephalus. The basilar cisterns are patent. No evidence of territorial infarct or acute ischemia. No extra-axial or intracranial fluid collection. Vascular: No hyperdense vessel or unexpected calcification. Skull: No fracture or focal lesion. Sinuses/Orbits: Paranasal sinuses and mastoid air cells are clear. The visualized orbits are unremarkable. Other: None. CT CERVICAL SPINE FINDINGS Alignment: Normal. Skull base and vertebrae: No acute  fracture. Vertebral body heights are maintained. The dens and skull base are intact. Soft tissues and spinal  canal: No prevertebral fluid or swelling. No visible canal hematoma. Disc levels: Disc space narrowing and endplate spurring at Q7-Y1 with large peripherally calcified disc osteophyte complex. This causes narrowing of the spinal canal and bilateral foramina at this level. Lesser endplate spurring at P5-K9 and C4-C5. Upper chest: 2 right thyroid nodules both less than 15 mm, no further evaluation recommended based on size. No acute findings. Other: None. IMPRESSION: 1.  No acute intracranial abnormality. 2. Age advanced cerebral atrophy, stable from May 2018. 3. No acute fracture or subluxation of the cervical spine. 4. Degenerative change in the cervical spine with spinal canal and neural foraminal stenosis at C5-C6. Electronically Signed   By: Jeb Levering M.D.   On: 08/02/2017 03:36   Dg Foot Complete Left  Result Date: 08/02/2017 CLINICAL DATA:  Post reduction left foot. EXAM: LEFT FOOT - COMPLETE 3+ VIEW COMPARISON:  Pre reduction radiographs earlier this day. FINDINGS: Great toe dislocation has been reduced and is now anatomic. Small osseous fragments about the dorsal medial metatarsal phalangeal joint may reflect fractured sesamoid. Distal fibular fracture again seen. Dorsal soft tissue edema. IMPRESSION: Normal alignment of the first ray post great toe reduction. Small fracture fragments about the plantar medial first metatarsal phalangeal joint may represent fractured tibial sesamoid. Electronically Signed   By: Jeb Levering M.D.   On: 08/02/2017 04:59   Dg Foot Complete Left  Result Date: 08/02/2017 CLINICAL DATA:  Abrasion of the right little toe soft tissue swelling of the left ankle and foot. EXAM: LEFT FOOT - COMPLETE 3+ VIEW COMPARISON:  None. FINDINGS: Dorsal and lateral dislocation of the great toe at the first MTP joint. No acute fracture of the great toe. Acute closed oblique fracture of the distal fibula extending into the ankle joint better visualized on the ankle radiographs.  There soft tissue swelling over the lateral malleolus. Calcaneal enthesophytes. Lisfranc articulation appears congruent. IMPRESSION: 1. Dorsal and lateral displacement of the first proximal phalanx relative to the head of the first metatarsal at the first MTP articulation. No acute fracture identified. Soft tissue swelling of the great toe and forefoot. 2. Acute closed oblique fracture of the distal fibula with extension into the ankle joint. 3. Calcaneal enthesopathy. Electronically Signed   By: Ashley Royalty M.D.   On: 08/02/2017 03:52   Dg Foot Complete Right  Result Date: 08/02/2017 CLINICAL DATA:  Abrasion of the right little toe EXAM: RIGHT FOOT COMPLETE - 3+ VIEW COMPARISON:  None. FINDINGS: Acute oblique fracture of the fifth proximal phalangeal neck without intra-articular extension. Soft tissue swelling is noted of the little toe. Calcaneal enthesopathy is seen along the plantar aspect. Probable bone island of the body of the calcaneus. Haglund deformity with prominent dorsal bony protuberance of the calcaneus. IMPRESSION: Acute oblique fracture of the right fifth proximal phalangeal neck without intra-articular involvement. Electronically Signed   By: Ashley Royalty M.D.   On: 08/02/2017 03:57    Micro Results    No results found for this or any previous visit (from the past 240 hour(s)).     Today   Subjective:   [name] No complaints today, no fever, no chills, no nausea or vomiting   Objective:   Blood pressure 108/69, pulse 79, temperature 98.7 F (37.1 C), temperature source Oral, resp. rate 14, height 6' 4"  (1.93 m), weight 92 kg (202 lb 13.2 oz), SpO2 97 %.  Intake/Output Summary (Last 24 hours) at 08/11/2017 1525 Last data filed at 08/11/2017 1500 Gross per 24 hour  Intake 840 ml  Output 900 ml  Net -60 ml    Exam  Awake alert oriented 3,Inc. In bed in no apparent distress Good air entry  B/L, CTA , no wheezing RRR,no rubs, murmur,gallops abdomen soft,  nontender, nondistended, bowel sounds present Right lower extremity with good pulses, no cyanosis,right fifth digit wound healed, left lower extremity in fracture boot, some bruising, but no cellulitis    Data Review   CBC w Diff:  Lab Results  Component Value Date   WBC 7.3 08/11/2017   HGB 12.7 (L) 08/11/2017   HGB 14.8 05/27/2017   HCT 37.2 (L) 08/11/2017   HCT 43.5 05/27/2017   PLT 172 08/11/2017   PLT 96 (LL) 05/27/2017   LYMPHOPCT 15 08/02/2017   MONOPCT 15 08/02/2017   EOSPCT 0 08/02/2017   BASOPCT 0 08/02/2017    CMP:  Lab Results  Component Value Date   NA 137 08/11/2017   NA 141 05/27/2017   K 4.4 08/11/2017   CL 101 08/11/2017   CO2 25 08/11/2017   BUN 32 (H) 08/11/2017   BUN 25 (H) 05/27/2017   CREATININE 1.53 (H) 08/11/2017   PROT 6.8 08/02/2017   PROT 7.1 05/27/2017   ALBUMIN 3.6 08/02/2017   ALBUMIN 4.4 05/27/2017   BILITOT 0.9 08/02/2017   BILITOT 0.4 05/27/2017   ALKPHOS 58 08/02/2017   AST 20 08/02/2017   ALT 17 08/02/2017  .   Total Time in preparing paper work, data evaluation and todays exam - 44 minutes  Phillips Climes M.D on 08/11/2017 at 3:25 PM  [facility]   Office  [phone]

## 2017-08-12 DIAGNOSIS — R262 Difficulty in walking, not elsewhere classified: Secondary | ICD-10-CM | POA: Insufficient documentation

## 2017-08-13 ENCOUNTER — Encounter: Payer: Self-pay | Admitting: Physical Therapy

## 2017-08-17 ENCOUNTER — Encounter: Payer: Self-pay | Admitting: Family Medicine

## 2017-08-25 ENCOUNTER — Ambulatory Visit: Payer: Self-pay | Admitting: Family Medicine

## 2017-08-25 MED FILL — !CIALIS 5 MG TABLET: 5 | 30 days supply | Qty: 30 | Fill #2

## 2017-09-01 ENCOUNTER — Encounter: Payer: Self-pay | Admitting: Physical Therapy

## 2017-09-01 NOTE — Therapy (Signed)
[place], [place], [address] Phone: [phone]   Fax:  [phone]  Patient Details  Name: [name] MRN: [id number] Date of Birth: [birth year] Referring Provider:  No ref. provider found  Encounter Date: 09/01/2017   PHYSICAL THERAPY DISCHARGE SUMMARY  Visits from Start of Care: 4  Current functional level related to goals / functional outcomes:  Patient has not returned to skilled OPPT services since last visit. DC.    Remaining deficits: Unable to assess    Education / Equipment: N/A  Plan: Patient agrees to discharge.  Patient goals were not met. Patient is being discharged due to not returning since the last visit.  ?????         Deniece Ree PT, DPT, CBIS  Supplemental Physical Therapist [place]   Pager [facility] [address] [place], [place], [address] Phone: [phone]   Fax:  [phone]

## 2017-09-02 ENCOUNTER — Encounter (INDEPENDENT_AMBULATORY_CARE_PROVIDER_SITE_OTHER): Payer: Self-pay | Admitting: Orthopedic Surgery

## 2017-09-02 ENCOUNTER — Ambulatory Visit (INDEPENDENT_AMBULATORY_CARE_PROVIDER_SITE_OTHER): Payer: Medicaid Other

## 2017-09-02 ENCOUNTER — Ambulatory Visit (INDEPENDENT_AMBULATORY_CARE_PROVIDER_SITE_OTHER): Payer: Medicaid Other | Admitting: Orthopedic Surgery

## 2017-09-02 DIAGNOSIS — M25572 Pain in left ankle and joints of left foot: Secondary | ICD-10-CM

## 2017-09-02 DIAGNOSIS — S8262XA Displaced fracture of lateral malleolus of left fibula, initial encounter for closed fracture: Secondary | ICD-10-CM

## 2017-09-02 NOTE — Progress Notes (Signed)
Office Visit Note   Patient: [name]           Date of Birth: 14-Feb-1966           MRN: [id number] Visit Date: 09/02/2017 Requested by: [name], MD [place], [place] [address] PCP: [name], MD  Subjective: Chief Complaint  Patient presents with  . Left Ankle - Follow-up, Injury    HPI: [place] is a patient who injured his left ankle 08/02/2017.  Went to the emergency room and was advised he had a lateral malleolus fracture.  He was sent to a nursing facility due to not having help at home and other health problems.  He is been nonweightbearing in a fracture boot.              ROS: All systems reviewed are negative as they relate to the chief complaint within the history of present illness.  Patient denies  fevers or chills.   Assessment & Plan: Visit Diagnoses:  1. Pain in left ankle and joints of left foot   2. Closed displaced fracture of lateral malleolus of left fibula, initial encounter     Plan: Impression is closed minimally displaced lateral malleolus fracture on the left-hand side.  There is no medial sided tenderness.  Radiographs show some callus formation and symmetric mortise.  Plan is Ace wrap to the ankle with weightbearing as tolerated in the fracture boot.  Come back in 4 weeks for clinical recheck repeat radiographs and likely release at that time.  Follow-Up Instructions: No follow-ups on file.   Orders:  Orders Placed This Encounter  Procedures  . XR Ankle Complete Left   No orders of the defined types were placed in this encounter.     Procedures: No procedures performed   Clinical Data: No additional findings.  Objective: Vital Signs: There were no vitals taken for this visit.  Physical Exam:   Constitutional: Patient appears well-developed HEENT:  Head: Normocephalic Eyes:EOM are normal Neck: Normal range of motion Cardiovascular: Normal rate Pulmonary/chest: Effort normal Neurologic: Patient is  alert Skin: Skin is warm Psychiatric: Patient has normal mood and affect    Ortho Exam: Orthopedic exam demonstrates slight tenderness to palpation of the lateral malleolus.  Pedal pulses palpable.  No calf tenderness is present with negative Homans bilaterally.  Palpable intact nontender anterior to posterior to peroneal and Achilles tendons.  No real tenderness in the midfoot region with pronation supination of the foot  Specialty Comments:  No specialty comments available.  Imaging: Xr Ankle Complete Left  Result Date: 09/02/2017 AP lateral mortise left ankle reviewed.  Lateral malleolar fracture is noted.  1 to 2 mm of shortening is present.  Robust callus formation is not observed but there is some callus formation.  Remainder of midfoot normal.  No medial malleolar fracture or talar dome lesion present    PMFS History: Patient Active Problem List   Diagnosis Date Noted  . Closed displaced fracture of lateral malleolus of left fibula 08/02/2017  . BPH (benign prostatic hyperplasia) 07/24/2017  . Hemorrhoid 05/27/2017  . Shoulder arthritis 05/27/2017  . Diabetic neuropathy ([place]) 05/27/2017  . Degenerative disc disease, lumbar 02/04/2017  . Constipation   . History of seizures 09/20/2016  . Dementia, early onset with advanced brain atrophy for age 02/20/2017  . Acute lower UTI   . Urinary retention   . Acute metabolic encephalopathy 19/41/7408  . HLD (hyperlipidemia) 09/07/2016  . GERD (gastroesophageal reflux disease) 09/07/2016  .  PTSD (post-traumatic stress disorder) 09/07/2016  . Acute encephalopathy 09/07/2016  . Fall 09/07/2016  . Chronic kidney disease   . Ataxia 10/14/2015  . Hypokalemia 10/03/2015  . Hemiparesis ([place])   . Dyslipidemia 05/31/2014  . Insomnia 11/11/2013  . Degenerative joint disease (DJD) of hip 08/16/2013  . Erectile dysfunction 02/17/2011  . Constipation - functional 02/17/2011  . Diabetes type 2, uncontrolled ([place]) 11/06/2010  .  Hypogonadism male 05/20/2010  . TOBACCO USER 05/20/2010  . Demoralization and apathy 05/20/2010  . Schizoaffective disorder, bipolar type ([place]) 01/18/2010  . Right shoulder pain 01/18/2010  . Anxiety state 12/05/2006  . Essential hypertension 12/05/2006   Past Medical History:  Diagnosis Date  . ADD (attention deficit disorder)   . Anxiety   . Arthritis    right hip  . Bipolar 1 disorder ([place])   . Blood in urine   . CKD (chronic kidney disease), stage III ([place])   . Creatinine elevation   . Dementia    "early onset" (08/04/2017)  . Depression    bipolar [facility]  . Diabetes mellitus without complication ([place])   . Family history of anesthesia complication    pt is unsure , but pt father may have been difficult to arouse   . HCAP (healthcare-associated pneumonia) 10/31/2012  . History of kidney stones   . Hypertension   . Hypogonadism male   . Liver fatty degeneration   . Microscopic hematuria    hereditary s/p Urology eval  . Osteoarthritis of right hip 11/28/2011   2012 2015 s/p THR Severe  Dr Novella Olive    . Pleural effusion 11/02/2012  . Pneumonia 10-2012  . Pneumonia, organism unspecified(486) 11/02/2012  . Polysubstance dependence, non-opioid, in remission ([place])    remote  . Primary osteoarthritis of left hip 05/22/2015  . PTSD (post-traumatic stress disorder)    SOCIAL ANXIETY DISORDER   . Suicide attempt by multiple drug overdose ([place]) 01/16/16   Grieving his cat's death 03-Aug-2015    Family History  Problem Relation Age of Onset  . Diabetes Father   . Cancer Mother        died of melanoma with mets  . Cervical cancer Sister   . Diabetes Sister   . Other Neg Hx        hypogonadism    Past Surgical History:  Procedure Laterality Date  . BACK SURGERY    . CLOSED REDUCTION METACARPAL WITH PERCUTANEOUS PINNING Right   . LUMBAR DISC SURGERY    . TONSILLECTOMY    . TOTAL HIP ARTHROPLASTY Right 08/16/2013   Procedure: TOTAL HIP ARTHROPLASTY ANTERIOR APPROACH;   Surgeon: Hessie Dibble, MD;  Location: [facility];  Service: Orthopedics;  Laterality: Right;  . TOTAL HIP ARTHROPLASTY Left 05/22/2015   Procedure: TOTAL HIP ARTHROPLASTY ANTERIOR APPROACH;  Surgeon: Melrose Nakayama, MD;  Location: [facility];  Service: Orthopedics;  Laterality: Left;   Social History   Occupational History  . Occupation: disability  Tobacco Use  . Smoking status: Former Smoker    Years: 0.00    Types: Cigarettes    Last attempt to quit: 02/19/2014    Years since quitting: 3.5  . Smokeless tobacco: Never Used  Substance and Sexual Activity  . Alcohol use: No  . Drug use: No    Comment: hx of marijuana/cocaine/crack use but sober since 08-03-2022  . Sexual activity: Not Currently

## 2017-09-08 ENCOUNTER — Other Ambulatory Visit: Payer: Self-pay | Admitting: Family Medicine

## 2017-10-12 ENCOUNTER — Ambulatory Visit (INDEPENDENT_AMBULATORY_CARE_PROVIDER_SITE_OTHER): Payer: Medicaid Other | Admitting: Orthopedic Surgery

## 2017-10-12 MED FILL — !CIALIS 5 MG TABLET: 5 | 30 days supply | Qty: 30 | Fill #3

## 2017-10-28 ENCOUNTER — Encounter (INDEPENDENT_AMBULATORY_CARE_PROVIDER_SITE_OTHER): Payer: Self-pay | Admitting: Orthopedic Surgery

## 2017-10-28 ENCOUNTER — Ambulatory Visit (INDEPENDENT_AMBULATORY_CARE_PROVIDER_SITE_OTHER): Payer: Medicaid Other | Admitting: Orthopedic Surgery

## 2017-10-28 ENCOUNTER — Ambulatory Visit (INDEPENDENT_AMBULATORY_CARE_PROVIDER_SITE_OTHER): Payer: Medicaid Other

## 2017-10-28 DIAGNOSIS — S8262XA Displaced fracture of lateral malleolus of left fibula, initial encounter for closed fracture: Secondary | ICD-10-CM

## 2017-10-28 NOTE — Progress Notes (Signed)
Post-Op Visit Note   Patient: [name]           Date of Birth: [birth year]           MRN: [id number] Visit Date: 10/28/2017 PCP: [name], MD   Assessment & Plan:  Chief Complaint:  Chief Complaint  Patient presents with  . Left Ankle - Follow-up   Visit Diagnoses:  1. Closed displaced fracture of lateral malleolus of left fibula, initial encounter     Plan: Patient is now several months out left ankle lateral malleolus fracture.  Patient has been weightbearing as tolerated in a fracture boot.  On examination he has no tenderness on that lateral malleolar side.  Mild swelling is present but no calf tenderness and negative Homans present.  Radiographs look good.  Plan B weightbearing as tolerated in regular shoes.  Follow-up with me as needed  Follow-Up Instructions: Return if symptoms worsen or fail to improve.   Orders:  Orders Placed This Encounter  Procedures  . XR Ankle Complete Left   No orders of the defined types were placed in this encounter.   Imaging: Xr Ankle Complete Left  Result Date: 10/28/2017 AP lateral mortise left ankle reviewed.  Lateral malleolar fracture again visualized.  Callus formation is present on the lateral view.  Ankle mortise is symmetric.  No excessive shortening of the lateral malleolus noted.   PMFS History: Patient Active Problem List   Diagnosis Date Noted  . Closed displaced fracture of lateral malleolus of left fibula 08/02/2017  . BPH (benign prostatic hyperplasia) 07/24/2017  . Hemorrhoid 05/27/2017  . Shoulder arthritis 05/27/2017  . Diabetic neuropathy ([place]) 05/27/2017  . Degenerative disc disease, lumbar 02/04/2017  . Constipation   . History of seizures 09/20/2016  . Dementia, early onset with advanced brain atrophy for age 57/05/2016  . Acute lower UTI   . Urinary retention   . Acute metabolic encephalopathy 56/43/3295  . HLD (hyperlipidemia) 09/07/2016  . GERD (gastroesophageal reflux disease) 09/07/2016   . PTSD (post-traumatic stress disorder) 09/07/2016  . Acute encephalopathy 09/07/2016  . Fall 09/07/2016  . Chronic kidney disease   . Ataxia 10/14/2015  . Hypokalemia 10/03/2015  . Hemiparesis ([place])   . Dyslipidemia 05/31/2014  . Insomnia 11/11/2013  . Degenerative joint disease (DJD) of hip 08/16/2013  . Erectile dysfunction 02/17/2011  . Constipation - functional 02/17/2011  . Diabetes type 2, uncontrolled ([place]) 11/06/2010  . Hypogonadism male 05/20/2010  . TOBACCO USER 05/20/2010  . Demoralization and apathy 05/20/2010  . Schizoaffective disorder, bipolar type ([place]) 01/18/2010  . Right shoulder pain 01/18/2010  . Anxiety state 12/05/2006  . Essential hypertension 12/05/2006   Past Medical History:  Diagnosis Date  . ADD (attention deficit disorder)   . Anxiety   . Arthritis    right hip  . Bipolar 1 disorder ([place])   . Blood in urine   . CKD (chronic kidney disease), stage III ([place])   . Creatinine elevation   . Dementia    "early onset" (08/04/2017)  . Depression    bipolar [facility]  . Diabetes mellitus without complication ([place])   . Family history of anesthesia complication    pt is unsure , but pt father may have been difficult to arouse   . HCAP (healthcare-associated pneumonia) 10/31/2012  . History of kidney stones   . Hypertension   . Hypogonadism male   . Liver fatty degeneration   . Microscopic hematuria    hereditary s/p Urology eval  .  Osteoarthritis of right hip 11/28/2011   2012 2015 s/p THR Severe  Dr Novella Olive    . Pleural effusion 11/02/2012  . Pneumonia 10-2012  . Pneumonia, organism unspecified(486) 11/02/2012  . Polysubstance dependence, non-opioid, in remission ([place])    remote  . Primary osteoarthritis of left hip 05/22/2015  . PTSD (post-traumatic stress disorder)    SOCIAL ANXIETY DISORDER   . Suicide attempt by multiple drug overdose ([place]) 01-13-16   Grieving his cat's death 07/29/2015    Family History  Problem Relation Age of Onset  .  Diabetes Father   . Cancer Mother        died of melanoma with mets  . Cervical cancer Sister   . Diabetes Sister   . Other Neg Hx        hypogonadism    Past Surgical History:  Procedure Laterality Date  . BACK SURGERY    . CLOSED REDUCTION METACARPAL WITH PERCUTANEOUS PINNING Right   . LUMBAR DISC SURGERY    . TONSILLECTOMY    . TOTAL HIP ARTHROPLASTY Right 08/16/2013   Procedure: TOTAL HIP ARTHROPLASTY ANTERIOR APPROACH;  Surgeon: Hessie Dibble, MD;  Location: [facility];  Service: Orthopedics;  Laterality: Right;  . TOTAL HIP ARTHROPLASTY Left 05/22/2015   Procedure: TOTAL HIP ARTHROPLASTY ANTERIOR APPROACH;  Surgeon: Melrose Nakayama, MD;  Location: [facility];  Service: Orthopedics;  Laterality: Left;   Social History   Occupational History  . Occupation: disability  Tobacco Use  . Smoking status: Former Smoker    Years: 0.00    Types: Cigarettes    Last attempt to quit: 02/19/2014    Years since quitting: 3.6  . Smokeless tobacco: Never Used  Substance and Sexual Activity  . Alcohol use: No  . Drug use: No    Comment: hx of marijuana/cocaine/crack use but sober since 29-Jul-2022  . Sexual activity: Not Currently

## 2018-05-12 ENCOUNTER — Ambulatory Visit: Payer: Medicaid Other | Admitting: Licensed Clinical Social Worker

## 2018-05-12 ENCOUNTER — Ambulatory Visit: Payer: Medicaid Other | Attending: Family Medicine | Admitting: Family Medicine

## 2018-05-12 VITALS — BP 124/84 | HR 103 | Temp 98.0°F | Resp 16 | Wt 219.4 lb

## 2018-05-12 DIAGNOSIS — E1165 Type 2 diabetes mellitus with hyperglycemia: Secondary | ICD-10-CM | POA: Diagnosis not present

## 2018-05-12 DIAGNOSIS — M160 Bilateral primary osteoarthritis of hip: Secondary | ICD-10-CM | POA: Insufficient documentation

## 2018-05-12 DIAGNOSIS — M5136 Other intervertebral disc degeneration, lumbar region: Secondary | ICD-10-CM | POA: Diagnosis not present

## 2018-05-12 DIAGNOSIS — Z87442 Personal history of urinary calculi: Secondary | ICD-10-CM | POA: Diagnosis not present

## 2018-05-12 DIAGNOSIS — E291 Testicular hypofunction: Secondary | ICD-10-CM | POA: Insufficient documentation

## 2018-05-12 DIAGNOSIS — F419 Anxiety disorder, unspecified: Secondary | ICD-10-CM

## 2018-05-12 DIAGNOSIS — M545 Low back pain: Secondary | ICD-10-CM | POA: Diagnosis present

## 2018-05-12 DIAGNOSIS — F339 Major depressive disorder, recurrent, unspecified: Secondary | ICD-10-CM

## 2018-05-12 DIAGNOSIS — G4700 Insomnia, unspecified: Secondary | ICD-10-CM | POA: Diagnosis not present

## 2018-05-12 DIAGNOSIS — N183 Chronic kidney disease, stage 3 (moderate): Secondary | ICD-10-CM | POA: Diagnosis not present

## 2018-05-12 DIAGNOSIS — R338 Other retention of urine: Secondary | ICD-10-CM | POA: Diagnosis not present

## 2018-05-12 DIAGNOSIS — F250 Schizoaffective disorder, bipolar type: Secondary | ICD-10-CM | POA: Diagnosis not present

## 2018-05-12 DIAGNOSIS — I129 Hypertensive chronic kidney disease with stage 1 through stage 4 chronic kidney disease, or unspecified chronic kidney disease: Secondary | ICD-10-CM | POA: Insufficient documentation

## 2018-05-12 DIAGNOSIS — R270 Ataxia, unspecified: Secondary | ICD-10-CM | POA: Diagnosis not present

## 2018-05-12 DIAGNOSIS — M25519 Pain in unspecified shoulder: Secondary | ICD-10-CM | POA: Diagnosis not present

## 2018-05-12 DIAGNOSIS — F4310 Post-traumatic stress disorder, unspecified: Secondary | ICD-10-CM | POA: Diagnosis not present

## 2018-05-12 DIAGNOSIS — Z794 Long term (current) use of insulin: Secondary | ICD-10-CM | POA: Insufficient documentation

## 2018-05-12 DIAGNOSIS — E11649 Type 2 diabetes mellitus with hypoglycemia without coma: Secondary | ICD-10-CM | POA: Insufficient documentation

## 2018-05-12 DIAGNOSIS — Z79899 Other long term (current) drug therapy: Secondary | ICD-10-CM | POA: Diagnosis not present

## 2018-05-12 DIAGNOSIS — E1122 Type 2 diabetes mellitus with diabetic chronic kidney disease: Secondary | ICD-10-CM | POA: Diagnosis not present

## 2018-05-12 DIAGNOSIS — G8929 Other chronic pain: Secondary | ICD-10-CM | POA: Diagnosis not present

## 2018-05-12 DIAGNOSIS — F0280 Dementia in other diseases classified elsewhere without behavioral disturbance: Secondary | ICD-10-CM | POA: Insufficient documentation

## 2018-05-12 DIAGNOSIS — M542 Cervicalgia: Secondary | ICD-10-CM | POA: Diagnosis not present

## 2018-05-12 DIAGNOSIS — N401 Enlarged prostate with lower urinary tract symptoms: Secondary | ICD-10-CM | POA: Insufficient documentation

## 2018-05-12 DIAGNOSIS — Z885 Allergy status to narcotic agent status: Secondary | ICD-10-CM | POA: Insufficient documentation

## 2018-05-12 MED ORDER — TADALAFIL 5 MG PO TABS: ORAL_TABLET | ORAL | 3 refills | Status: DC

## 2018-05-12 MED ORDER — GABAPENTIN 300 MG PO CAPS: 300.0000 mg | ORAL_CAPSULE | Freq: Three times a day (TID) | ORAL | 3 refills | Status: DC

## 2018-05-12 MED ORDER — INSULIN ASPART 100 UNIT/ML ~~LOC~~ SOLN: 0.0000 [IU] | Freq: Three times a day (TID) | SUBCUTANEOUS | 3 refills | Status: DC

## 2018-05-12 MED ORDER — TRAMADOL HCL 50 MG PO TABS: 50.0000 mg | ORAL_TABLET | Freq: Every day | ORAL | 1 refills | Status: DC

## 2018-05-12 MED ORDER — LIDOCAINE 5 % EX PTCH: 1.0000 | MEDICATED_PATCH | CUTANEOUS | 0 refills | Status: DC

## 2018-05-12 MED ORDER — LINAGLIPTIN 5 MG PO TABS: 5.0000 mg | ORAL_TABLET | Freq: Every day | ORAL | 3 refills | Status: DC

## 2018-05-12 MED ORDER — TESTOSTERONE 20.25 MG/1.25GM (1.62%) TD GEL: 20.2500 mg | Freq: Every morning | TRANSDERMAL | 3 refills | Status: DC

## 2018-05-12 MED ORDER — INSULIN DETEMIR 100 UNIT/ML FLEXPEN: 30.0000 [IU] | PEN_INJECTOR | Freq: Every day | SUBCUTANEOUS | 3 refills | Status: DC

## 2018-05-12 NOTE — BH Specialist Note (Addendum)
Integrated Behavioral Health Initial Visit  MRN: [id number] Name: [name]  Number of [facility] Clinician visits:: 1/6 Session Start time: 2:30 PM  Session End time: 3:30 PM Total time: 1 hour  Type of Service: [place] Interpretor:No.    Warm Hand Off Completed.       SUBJECTIVE: [name] is a 53 y.o. male accompanied by self Patient was referred by PCP [facility] for depression, anxiety, and housing resources. Patient reports the following symptoms/concerns: Pt reports that he has been dealing with depression and anxiety for multiple years. Reports that he has been diagnosed with PTSD and schizophrenia disorder. States that he has dementia.  Duration of problem: ongoing; Severity of problem: moderate  OBJECTIVE: Mood: Anxious and Affect: Appropriate Risk of harm to self or others: Suicidal ideation No plan to harm self or others Pt indicated on screening for SI, once inquired during session pt denied. Stated he had suicide attempt in 2012.   LIFE CONTEXT: Family and Social: Reports that his sister is supportive when needed. Limited support from other family. School/Work: Pt just received notification of disability asistance and medicaid. Self-Care: Pt did not report substance use. Pt reports little pleasurable activities. Life Changes: Pt was recently in assisted living facility due to ankle replacement. Pt was receiving behavioral health assistance prior to assisted living facility and desires to reestablish with [facility].   GOALS ADDRESSED: Patient will: 1. Reduce symptoms of: anxiety, depression and insomnia 2. Increase knowledge and/or ability of: coping skills  3. Demonstrate ability to: Increase healthy adjustment to current life circumstances and Increase adequate support systems for patient/family  INTERVENTIONS: Interventions utilized: Motivational Interviewing, Supportive Counseling, Sleep Hygiene and  Link to [employer]  Standardized Assessments completed: C-SSRS Short, GAD-7 and PHQ 2&9  ASSESSMENT: Patient currently experiencing depressive and anxiety symptoms. Pt has had a past suicide attempt over two years ago. Pt indicated SI on screening, once asked about SI pt denied. No plan/intent. Pt reports difficulty falling/staying asleep. He reports that he has dementia. States that he has left the stove on multiple times due to forgetfulness.Pt reports that he has been diagnosed with PTSD and schizophrenia. Per chart review, no indication of schizophrenia or dementia. Pt has support system but feels he is a burden to sister. Indicates he has little pleasurable activities. MSW intern assisted pt with locating pleasurable activities.   Pt reports that he recently received letter confirming disability assistance.Pt expressed housing concerns. Pt reportedly resided in assisted living facility and was working with Education officer, museum at facility to assist with housing. Reports that assisted living facility is in [place] and is not as accessible. MSW intern provided pt with housing resources to coincide with social worker assistance.    Patient may benefit from behavioral health assistance. Pt reports that he wants to reestablish with [facility]. MSW intern provided pt with further behavioral health resources and encouraged pt to increase coping skills.   PLAN: 1. Follow up with behavioral health clinician on : MSW intern encouraged pt to schedule appt if needed. 2. Behavioral recommendations: MSW encouraged pt to locate pleasurable activities and attend therapy 3. Referral(s): [facility] (In Clinic), [facility] (LME/Outside Clinic) and [employer] Resources:  Housing 4. "From scale of 1-10, how likely are you to follow plan?":   Ruffin Pyo, MSW Intern 05/13/2018, 3:26 PM

## 2018-05-12 NOTE — Progress Notes (Signed)
Subjective:  Patient ID: [name], male    DOB: 1965/09/07  Age: 53 y.o. 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 Marlou Sa of orthopedics and he was subsequently discharged to rehab.  Discharged from [place] and rehab 2 weeks ago and was given a month supply of his medications.  I have reviewed his medication list extensively and this reveals polypharmacy with a lot of psychotropic medications (Abilify, trazodone, Wellbutrin, BuSpar, Seroquel) He complains of chronic low back pain, chronic neck pain and chronic shoulder pain for which he is on Lidoderm patch and tramadol He also has BPH for which he states just Cialis works as he has tried [employer] and finasteride in the past with no improvement in symptoms.  He informs me he was diagnosed with Parkinson's, Bipolar disorder, schizophrenia but I do not see record of Parkinson's in his chart and evaluation by neurology in the past did not reveal this. He was previously followed by psychiatry at [facility] and plans to return for follow-up. He was recently approved for Medicaid and is excited about this.  Past Medical History:  Diagnosis Date  . ADD (attention deficit disorder)   . Anxiety   . Arthritis    right hip  . Bipolar 1 disorder ([place])   . Blood in urine   . CKD (chronic kidney disease), stage III ([place])   . Creatinine elevation   . Dementia ([place])    "early onset" (08/04/2017)  . Depression    bipolar [facility]  . Diabetes mellitus without complication ([place])   . Family history of anesthesia complication    pt is unsure , but pt father may  have been difficult to arouse   . HCAP (healthcare-associated pneumonia) 10/31/2012  . History of kidney stones   . Hypertension   . Hypogonadism male   . Liver fatty degeneration   . Microscopic hematuria    hereditary s/p Urology eval  . Osteoarthritis of right hip 11/28/2011   2012 2015 s/p THR Severe  Dr Novella Olive    . Pleural effusion 11/02/2012  . Pneumonia 10-2012  . Pneumonia, organism unspecified(486) 11/02/2012  . Polysubstance dependence, non-opioid, in remission ([place])    remote  . Primary osteoarthritis of left hip 05/22/2015  . PTSD (post-traumatic stress disorder)    SOCIAL ANXIETY DISORDER   . Suicide attempt by multiple drug overdose 2015-12-30   Grieving his cat's death 06/06/15    Past Surgical History:  Procedure Laterality Date  . BACK SURGERY    . CLOSED REDUCTION METACARPAL WITH PERCUTANEOUS PINNING Right   . LUMBAR DISC SURGERY    . TONSILLECTOMY    . TOTAL HIP ARTHROPLASTY Right 08/16/2013   Procedure: TOTAL HIP ARTHROPLASTY ANTERIOR APPROACH;  Surgeon: Hessie Dibble, MD;  Location: [place];  Service: Orthopedics;  Laterality: Right;  . TOTAL HIP ARTHROPLASTY Left 05/22/2015   Procedure: TOTAL HIP ARTHROPLASTY ANTERIOR APPROACH;  Surgeon: Melrose Nakayama, MD;  Location: [place];  Service: Orthopedics;  Laterality: Left;      Allergies  Allergen Reactions  . Vicodin [Hydrocodone-Acetaminophen] Itching     Outpatient Medications  Prior to Visit  Medication Sig Dispense Refill  . acetaminophen (TYLENOL) 325 MG tablet Take 2 tablets (650 mg total) by mouth every 6 (six) hours as needed for mild pain (or Fever >/= 101).    Marland Kitchen atorvastatin (LIPITOR) 10 MG tablet Take 1 tablet (10 mg total) by mouth daily. 30 tablet 5  . bacitracin ointment Apply 1 application topically 3 (three) times daily. Apply to right fifth toe 120 g 0  . buPROPion (WELLBUTRIN XL) 300 MG 24 hr tablet Take 1 tablet (300 mg total) by mouth daily. 30 tablet 0  . cyanocobalamin 500 MCG tablet Take 1  tablet (500 mcg total) by mouth daily. 30 tablet 0  . diclofenac sodium (VOLTAREN) 1 % GEL Apply 4 g topically 4 (four) times daily. (Patient taking differently: Apply 4 g topically 4 (four) times daily as needed (pain). ) 100 g 1  . divalproex (DEPAKOTE) 500 MG DR tablet Take 3 tablets (1,500 mg total) by mouth at bedtime. 90 tablet 0  . feeding supplement, ENSURE ENLIVE, (ENSURE ENLIVE) LIQD Take 237 mLs by mouth 2 (two) times daily between meals. 237 mL 12  . QUEtiapine (SEROQUEL) 400 MG tablet Take 1 tablet (400 mg total) by mouth at bedtime. 30 tablet 11  . traZODone (DESYREL) 150 MG tablet Take 1 tablet (150 mg total) by mouth at bedtime. 30 tablet 1  . insulin aspart (NOVOLOG) 100 UNIT/ML injection Inject 0-15 Units into the skin 3 (three) times daily with meals. 10 mL 11  . insulin glargine (LANTUS) 100 UNIT/ML injection Inject 0.28 mLs (28 Units total) into the skin daily at 10 pm. 10 mL 11  . pregabalin (LYRICA) 75 MG capsule Take 1 capsule (75 mg total) by mouth 2 (two) times daily. (Patient not taking: Reported on 06/23/2017) 60 capsule 3  . tadalafil (CIALIS) 5 MG tablet TAKE 1 TABLET (5 MG TOTAL) BY MOUTH DAILY (Patient taking differently: Take 5 mg by mouth daily as needed for erectile dysfunction. ) 30 tablet 3  . Testosterone (ANDROGEL) 20.25 MG/1.25GM (1.62%) GEL Apply 20.25 mg topically every morning. Apply topically under arm pit alternating with each application. (Patient not taking: Reported on 06/23/2017) 1.25 g 5  . traMADol (ULTRAM) 50 MG tablet TAKE 1 TABLET BY MOUTH AT BEDTIME. 30 tablet 1  . traMADol (ULTRAM) 50 MG tablet Take 1 tablet (50 mg total) by mouth every 12 (twelve) hours as needed for moderate pain or severe pain. 10 tablet 0   No facility-administered medications prior to visit.     ROS Review of Systems  Constitutional: Negative for activity change and appetite change.  HENT: Negative for sinus pressure and sore throat.   Eyes: Negative for visual  disturbance.  Respiratory: Negative for cough, chest tightness and shortness of breath.   Cardiovascular: Negative for chest pain and leg swelling.  Gastrointestinal: Negative for abdominal distention, abdominal pain, constipation and diarrhea.  Endocrine: Negative.   Genitourinary: Negative for dysuria.  Musculoskeletal:       See hpi  Skin: Negative for rash.  Allergic/Immunologic: Negative.   Neurological: Negative for weakness, light-headedness and numbness.  Psychiatric/Behavioral: Negative for dysphoric mood and suicidal ideas.    Objective:  BP 124/84   Pulse (!) 103   Temp 98 F (36.7 C) (Oral)   Resp 16   Wt 219 lb 6.4 oz (99.5 kg)   SpO2 99%   BMI 26.71 kg/m   BP/Weight 05/12/2018 08/11/2017 8/54/6270  Systolic BP 350 093 -  Diastolic BP  84 73 -  Wt. (Lbs) 219.4 - 202.82  BMI 26.71 - 24.69      Physical Exam Constitutional:      Appearance: He is well-developed.  Cardiovascular:     Rate and Rhythm: Normal rate.     Heart sounds: Normal heart sounds. No murmur.  Pulmonary:     Effort: Pulmonary effort is normal.     Breath sounds: Normal breath sounds. No wheezing or rales.  Chest:     Chest wall: No tenderness.  Abdominal:     General: Bowel sounds are normal. There is no distension.     Palpations: Abdomen is soft. There is no mass.     Tenderness: There is no abdominal tenderness.  Musculoskeletal: Normal range of motion.  Neurological:     Mental Status: He is alert and oriented to person, place, and time.     Gait: Gait abnormal.     Comments: Motor strength-4/5 in bilateral lower extremity NoTTP  of neck, lumbar spine   Psychiatric:        Mood and Affect: Mood normal.      Assessment & Plan:   1. Uncontrolled type 2 diabetes mellitus with hypoglycemia, unspecified hypoglycemia coma status ([place]) Uncontrolled with A1c of 8.2 We will send of A1c today and adjust regimen accordingly Diabetic diet, lifestyle modifications - Glucose (CBG) -  Hemoglobin A1c - Insulin Detemir (LEVEMIR FLEXTOUCH) 100 UNIT/ML Pen; Inject 30 Units into the skin daily.  Dispense: 30 mL; Refill: 3 - insulin aspart (NOVOLOG) 100 UNIT/ML injection; Inject 0-15 Units into the skin 3 (three) times daily with meals.  Dispense: 10 mL; Refill: 3 - linagliptin (TRADJENTA) 5 MG TABS tablet; Take 1 tablet (5 mg total) by mouth daily.  Dispense: 30 tablet; Refill: 3 - Microalbumin/Creatinine Ratio, Urine - CMP14+EGFR  2. Hypogonadism male - Testosterone (ANDROGEL) 20.25 MG/1.25GM (1.62%) GEL; Apply 20.25 mg topically every morning. Apply topically under arm pit alternating with each application.  Dispense: 1.25 g; Refill: 3 - CBC with Differential/Platelet - Testosterone, Free, Total, SHBG  3. Benign prostatic hyperplasia with urinary retention Failed treatment with Flomax and finasteride prescribed by his previous physicians - tadalafil (CIALIS) 5 MG tablet; TAKE 1 TABLET (5 MG TOTAL) BY MOUTH DAILY for BPH  Dispense: 30 tablet; Refill: 3  4. Degenerative disc disease, lumbar He was discharged on tramadol 3 times daily however I have informed him I do not see justification for such a huge amount of tramadol evident from my physical exam We will cut back to once a day - lidocaine (LIDODERM) 5 %; Place 1 patch onto the skin daily. Remove & Discard patch within 12 hours or as directed by MD  Dispense: 30 patch; Refill: 0 - gabapentin (NEURONTIN) 300 MG capsule; Take 1 capsule (300 mg total) by mouth 3 (three) times daily.  Dispense: 60 capsule; Refill: 3 - traMADol (ULTRAM) 50 MG tablet; Take 1 tablet (50 mg total) by mouth at bedtime.  Dispense: 30 tablet; Refill: 1 - Drug Screen 12+Alcohol+CRT, Ur  5. Schizoaffective disorder, bipolar type ([place]) He is on a bunch of psychotropic medications and will need to be reassessed by psych Advised to make an urgent appointment with [facility] where he was previously followed  6. Ataxia Unknown etiology Notes from  neurology in the past attributed this to multiple psychotropic medications   Meds ordered this encounter  Medications  . lidocaine (LIDODERM) 5 %    Sig: Place 1 patch onto the skin daily. Remove & Discard  patch within 12 hours or as directed by MD    Dispense:  30 patch    Refill:  0  . gabapentin (NEURONTIN) 300 MG capsule    Sig: Take 1 capsule (300 mg total) by mouth 3 (three) times daily.    Dispense:  60 capsule    Refill:  3  . Insulin Detemir (LEVEMIR FLEXTOUCH) 100 UNIT/ML Pen    Sig: Inject 30 Units into the skin daily.    Dispense:  30 mL    Refill:  3  . insulin aspart (NOVOLOG) 100 UNIT/ML injection    Sig: Inject 0-15 Units into the skin 3 (three) times daily with meals.    Dispense:  10 mL    Refill:  3  . linagliptin (TRADJENTA) 5 MG TABS tablet    Sig: Take 1 tablet (5 mg total) by mouth daily.    Dispense:  30 tablet    Refill:  3  . Testosterone (ANDROGEL) 20.25 MG/1.25GM (1.62%) GEL    Sig: Apply 20.25 mg topically every morning. Apply topically under arm pit alternating with each application.    Dispense:  1.25 g    Refill:  3    Dispense Pump  . tadalafil (CIALIS) 5 MG tablet    Sig: TAKE 1 TABLET (5 MG TOTAL) BY MOUTH DAILY for BPH    Dispense:  30 tablet    Refill:  3    Failed Flomax and Finasteride  . traMADol (ULTRAM) 50 MG tablet    Sig: Take 1 tablet (50 mg total) by mouth at bedtime.    Dispense:  30 tablet    Refill:  1    Follow-up: Return in about 1 month (around 06/12/2018) for coordination of care.   [name] MD

## 2018-05-13 ENCOUNTER — Encounter: Payer: Self-pay | Admitting: Family Medicine

## 2018-05-13 LAB — CMP14+EGFR
A/G RATIO: 1.8 (ref 1.2–2.2)
ALT: 15 IU/L (ref 0–44)
AST: 24 IU/L (ref 0–40)
Albumin: 4.9 g/dL (ref 4.0–5.0)
Alkaline Phosphatase: 102 IU/L (ref 39–117)
BILIRUBIN TOTAL: 0.3 mg/dL (ref 0.0–1.2)
BUN / CREAT RATIO: 9 (ref 9–20)
BUN: 19 mg/dL (ref 6–24)
CALCIUM: 9.7 mg/dL (ref 8.7–10.2)
CHLORIDE: 100 mmol/L (ref 96–106)
CO2: 23 mmol/L (ref 20–29)
Creatinine, Ser: 2.17 mg/dL — ABNORMAL HIGH (ref 0.76–1.27)
GFR, EST AFRICAN AMERICAN: 40 mL/min/{1.73_m2} — AB (ref >59)
GFR, EST NON AFRICAN AMERICAN: 34 mL/min/{1.73_m2} — AB (ref >59)
GLOBULIN, TOTAL: 2.7 g/dL (ref 1.5–4.5)
Glucose: 231 mg/dL — ABNORMAL HIGH (ref 65–99)
POTASSIUM: 4.6 mmol/L (ref 3.5–5.2)
SODIUM: 137 mmol/L (ref 134–144)
TOTAL PROTEIN: 7.6 g/dL (ref 6.0–8.5)

## 2018-05-13 LAB — CBC WITH DIFFERENTIAL/PLATELET
BASOS: 1 %
Basophils Absolute: 0 10*3/uL (ref 0.0–0.2)
EOS (ABSOLUTE): 0 10*3/uL (ref 0.0–0.4)
Eos: 0 %
HEMATOCRIT: 47.9 % (ref 37.5–51.0)
Hemoglobin: 16.5 g/dL (ref 13.0–17.7)
IMMATURE GRANS (ABS): 0.1 10*3/uL (ref 0.0–0.1)
IMMATURE GRANULOCYTES: 1 %
Lymphocytes Absolute: 2.3 10*3/uL (ref 0.7–3.1)
Lymphs: 34 %
MCH: 32.5 pg (ref 26.6–33.0)
MCHC: 34.4 g/dL (ref 31.5–35.7)
MCV: 95 fL (ref 79–97)
MONOS ABS: 0.5 10*3/uL (ref 0.1–0.9)
Monocytes: 8 %
NEUTROS ABS: 3.9 10*3/uL (ref 1.4–7.0)
NEUTROS PCT: 56 %
PLATELETS: 123 10*3/uL — AB (ref 150–450)
RBC: 5.07 x10E6/uL (ref 4.14–5.80)
RDW: 15.4 % (ref 11.6–15.4)
WBC: 6.8 10*3/uL (ref 3.4–10.8)

## 2018-05-13 LAB — TESTOSTERONE, FREE, TOTAL, SHBG
Sex Hormone Binding: 20.5 nmol/L (ref 19.3–76.4)
Testosterone, Free: 12.2 pg/mL (ref 7.2–24.0)
Testosterone: 288 ng/dL (ref 264–916)

## 2018-05-13 LAB — MICROALBUMIN / CREATININE URINE RATIO
Creatinine, Urine: 79.5 mg/dL
MICROALB/CREAT RATIO: 209 mg/g{creat} — AB (ref 0–29)
Microalbumin, Urine: 165.8 ug/mL

## 2018-05-13 LAB — GLUCOSE, POCT (MANUAL RESULT ENTRY): POC GLUCOSE: 272 mg/dL — AB (ref 70–99)

## 2018-05-13 LAB — HEMOGLOBIN A1C
Est. average glucose Bld gHb Est-mCnc: 169 mg/dL
HEMOGLOBIN A1C: 7.5 % — AB (ref 4.8–5.6)

## 2018-05-16 LAB — DRUG SCREEN 12+ALCOHOL+CRT, UR
Amphetamines, Urine: NEGATIVE ng/mL
BARBITURATE: NEGATIVE ng/mL
BENZODIAZ UR QL: NEGATIVE ng/mL
CANNABINOIDS: NEGATIVE ng/mL
Cocaine (Metabolite): NEGATIVE ng/mL
Creatinine, Urine: 84.6 mg/dL (ref 20.0–300.0)
ETHANOL, URINE: NEGATIVE %
Meperidine: NEGATIVE ng/mL
Methadone: NEGATIVE ng/mL
OPIATE SCREEN URINE: NEGATIVE ng/mL
OXYCODONE+OXYMORPHONE UR QL SCN: NEGATIVE ng/mL
Phencyclidine: NEGATIVE ng/mL
Propoxyphene: NEGATIVE ng/mL
Tramadol: POSITIVE — AB

## 2018-05-21 ENCOUNTER — Telehealth: Payer: Self-pay | Admitting: Licensed Clinical Social Worker

## 2018-05-21 NOTE — Telephone Encounter (Signed)
Returned pt call. Requested information on behavioral health resources. Pt will follow-up once he solidifies medicare plan and information, as he reports he was recently given medicare on 05/17/2018. Pt will return call with concerns regarding transportation.

## 2018-06-10 ENCOUNTER — Telehealth: Payer: Self-pay | Admitting: Family Medicine

## 2018-06-10 NOTE — Telephone Encounter (Signed)
1) Medication(s) Requested (by name): Tramadol silolosin -prostate medication atorvastotin  2) Pharmacy of Choice: [place], [place] - [address]  3) Special Requests:   Approved medications will be sent to the pharmacy, we will reach out if there is an issue.  Requests made after 3pm may not be addressed until the following business day!  If a patient is unsure of the name of the medication(s) please note and ask patient to call back when they are able to provide all info, do not send to responsible party until all information is available!

## 2018-06-14 MED ORDER — ATORVASTATIN CALCIUM 10 MG PO TABS: 10.0000 mg | ORAL_TABLET | Freq: Every day | ORAL | 2 refills | Status: DC

## 2018-06-14 NOTE — Telephone Encounter (Signed)
Pt has not used Dr. Smitty Pluck tramadol RX that was sent 05/12/18, there is no need for a new RX yet.  RX was sent for Cialis for pt to use daily for BPH.  RX for atorvastatin sent to pharmacy.

## 2018-06-15 ENCOUNTER — Telehealth: Payer: Self-pay | Admitting: Family Medicine

## 2018-06-15 DIAGNOSIS — R338 Other retention of urine: Principal | ICD-10-CM

## 2018-06-15 DIAGNOSIS — N401 Enlarged prostate with lower urinary tract symptoms: Secondary | ICD-10-CM

## 2018-06-15 MED ORDER — TADALAFIL 5 MG PO TABS: ORAL_TABLET | ORAL | 3 refills | Status: DC

## 2018-06-15 NOTE — Telephone Encounter (Signed)
Rx resent to patient's preferred pharmacy.

## 2018-06-15 NOTE — Telephone Encounter (Signed)
1) Medication(s) Requested (by name): Cialis  2) Pharmacy of Choice: [email or web address]  3) Special Requests: Pt called state he does not get this medication filled at the drug store and that he needs it sent to the mail order and that they are unable to request it from the store due to legal guidelines. Please f/u   Approved medications will be sent to the pharmacy, we will reach out if there is an issue.  Requests made after 3pm may not be addressed until the following business day!  If a patient is unsure of the name of the medication(s) please note and ask patient to call back when they are able to provide all info, do not send to responsible party until all information is available!

## 2018-06-16 ENCOUNTER — Ambulatory Visit: Payer: Self-pay | Admitting: Family Medicine

## 2018-06-22 ENCOUNTER — Other Ambulatory Visit: Payer: Self-pay | Admitting: Family Medicine

## 2018-06-24 DIAGNOSIS — F431 Post-traumatic stress disorder (PTSD): Secondary | ICD-10-CM | POA: Diagnosis not present

## 2018-07-20 ENCOUNTER — Ambulatory Visit (HOSPITAL_BASED_OUTPATIENT_CLINIC_OR_DEPARTMENT_OTHER): Payer: Medicare HMO | Admitting: Family Medicine

## 2018-07-20 ENCOUNTER — Encounter (HOSPITAL_COMMUNITY): Payer: Self-pay | Admitting: Emergency Medicine

## 2018-07-20 ENCOUNTER — Encounter: Payer: Self-pay | Admitting: Family Medicine

## 2018-07-20 ENCOUNTER — Other Ambulatory Visit: Payer: Self-pay

## 2018-07-20 ENCOUNTER — Emergency Department (HOSPITAL_COMMUNITY)
Admission: EM | Admit: 2018-07-20 | Discharge: 2018-07-20 | Disposition: A | Discharge status: Home / Self Care | Payer: Medicare HMO | Attending: Emergency Medicine | Admitting: Emergency Medicine

## 2018-07-20 VITALS — BP 164/99 | HR 92 | Temp 97.7°F | Wt 214.0 lb

## 2018-07-20 DIAGNOSIS — E1165 Type 2 diabetes mellitus with hyperglycemia: Secondary | ICD-10-CM | POA: Diagnosis not present

## 2018-07-20 DIAGNOSIS — Z87891 Personal history of nicotine dependence: Secondary | ICD-10-CM | POA: Diagnosis not present

## 2018-07-20 DIAGNOSIS — Z794 Long term (current) use of insulin: Secondary | ICD-10-CM | POA: Insufficient documentation

## 2018-07-20 DIAGNOSIS — I129 Hypertensive chronic kidney disease with stage 1 through stage 4 chronic kidney disease, or unspecified chronic kidney disease: Secondary | ICD-10-CM | POA: Insufficient documentation

## 2018-07-20 DIAGNOSIS — R531 Weakness: Secondary | ICD-10-CM | POA: Diagnosis not present

## 2018-07-20 DIAGNOSIS — Z96642 Presence of left artificial hip joint: Secondary | ICD-10-CM | POA: Diagnosis not present

## 2018-07-20 DIAGNOSIS — N183 Chronic kidney disease, stage 3 (moderate): Secondary | ICD-10-CM | POA: Diagnosis not present

## 2018-07-20 DIAGNOSIS — E1122 Type 2 diabetes mellitus with diabetic chronic kidney disease: Secondary | ICD-10-CM | POA: Insufficient documentation

## 2018-07-20 DIAGNOSIS — R358 Other polyuria: Secondary | ICD-10-CM | POA: Diagnosis present

## 2018-07-20 DIAGNOSIS — Z79899 Other long term (current) drug therapy: Secondary | ICD-10-CM | POA: Diagnosis not present

## 2018-07-20 DIAGNOSIS — K219 Gastro-esophageal reflux disease without esophagitis: Secondary | ICD-10-CM | POA: Diagnosis not present

## 2018-07-20 DIAGNOSIS — Z96641 Presence of right artificial hip joint: Secondary | ICD-10-CM | POA: Insufficient documentation

## 2018-07-20 DIAGNOSIS — F250 Schizoaffective disorder, bipolar type: Secondary | ICD-10-CM

## 2018-07-20 DIAGNOSIS — F0390 Unspecified dementia without behavioral disturbance: Secondary | ICD-10-CM | POA: Diagnosis not present

## 2018-07-20 DIAGNOSIS — J302 Other seasonal allergic rhinitis: Secondary | ICD-10-CM

## 2018-07-20 DIAGNOSIS — R739 Hyperglycemia, unspecified: Secondary | ICD-10-CM

## 2018-07-20 DIAGNOSIS — I10 Essential (primary) hypertension: Secondary | ICD-10-CM | POA: Diagnosis not present

## 2018-07-20 DIAGNOSIS — R0902 Hypoxemia: Secondary | ICD-10-CM | POA: Diagnosis not present

## 2018-07-20 LAB — CBC WITH DIFFERENTIAL/PLATELET
ABS IMMATURE GRANULOCYTES: 0.06 10*3/uL (ref 0.00–0.07)
BASOS ABS: 0 10*3/uL (ref 0.0–0.1)
Basophils Relative: 1 %
EOS ABS: 0 10*3/uL (ref 0.0–0.5)
Eosinophils Relative: 1 %
HEMATOCRIT: 40.4 % (ref 39.0–52.0)
Hemoglobin: 14.8 g/dL (ref 13.0–17.0)
IMMATURE GRANULOCYTES: 1 %
LYMPHS ABS: 1.7 10*3/uL (ref 0.7–4.0)
Lymphocytes Relative: 35 %
MCH: 33 pg (ref 26.0–34.0)
MCHC: 36.6 g/dL — ABNORMAL HIGH (ref 30.0–36.0)
MCV: 90 fL (ref 80.0–100.0)
MONOS PCT: 10 %
Monocytes Absolute: 0.5 10*3/uL (ref 0.1–1.0)
NEUTROS PCT: 52 %
Neutro Abs: 2.5 10*3/uL (ref 1.7–7.7)
Platelets: 120 10*3/uL — ABNORMAL LOW (ref 150–400)
RBC: 4.49 MIL/uL (ref 4.22–5.81)
RDW: 11.5 % (ref 11.5–15.5)
WBC: 4.7 10*3/uL (ref 4.0–10.5)
nRBC: 0 % (ref 0.0–0.2)

## 2018-07-20 LAB — POCT I-STAT EG7
Acid-base deficit: 2 mmol/L (ref 0.0–2.0)
BICARBONATE: 22.6 mmol/L (ref 20.0–28.0)
Calcium, Ion: 1.16 mmol/L (ref 1.15–1.40)
HCT: 42 % (ref 39.0–52.0)
Hemoglobin: 14.3 g/dL (ref 13.0–17.0)
O2 Saturation: 97 %
Potassium: 4.8 mmol/L (ref 3.5–5.1)
Sodium: 125 mmol/L — ABNORMAL LOW (ref 135–145)
TCO2: 24 mmol/L (ref 22–32)
pCO2, Ven: 36.7 mmHg — ABNORMAL LOW (ref 44.0–60.0)
pH, Ven: 7.397 (ref 7.250–7.430)
pO2, Ven: 88 mmHg — ABNORMAL HIGH (ref 32.0–45.0)

## 2018-07-20 LAB — BASIC METABOLIC PANEL
ANION GAP: 11 (ref 5–15)
BUN: 29 mg/dL — ABNORMAL HIGH (ref 6–20)
CALCIUM: 8.9 mg/dL (ref 8.9–10.3)
CHLORIDE: 93 mmol/L — AB (ref 98–111)
CO2: 20 mmol/L — AB (ref 22–32)
Creatinine, Ser: 1.78 mg/dL — ABNORMAL HIGH (ref 0.61–1.24)
GFR calc Af Amer: 50 mL/min — ABNORMAL LOW (ref >60)
GFR calc non Af Amer: 44 mL/min — ABNORMAL LOW (ref >60)
Glucose, Bld: 588 mg/dL (ref 70–99)
Potassium: 4.6 mmol/L (ref 3.5–5.1)
Sodium: 124 mmol/L — ABNORMAL LOW (ref 135–145)

## 2018-07-20 LAB — URINALYSIS, ROUTINE W REFLEX MICROSCOPIC
BACTERIA UA: NONE SEEN
Bilirubin Urine: NEGATIVE
Glucose, UA: >=500 mg/dL — AB
KETONES UR: NEGATIVE mg/dL
LEUKOCYTE UA: NEGATIVE
NITRITE: NEGATIVE
PROTEIN: NEGATIVE mg/dL
Specific Gravity, Urine: 1.026 (ref 1.005–1.030)
pH: 7 (ref 5.0–8.0)

## 2018-07-20 LAB — CBG MONITORING, ED
Glucose-Capillary: >600 mg/dL (ref 70–99)
Glucose-Capillary: 297 mg/dL — ABNORMAL HIGH (ref 70–99)

## 2018-07-20 LAB — GLUCOSE, POCT (MANUAL RESULT ENTRY): POC Glucose: >600 mg/dl (ref 70–99)

## 2018-07-20 MED ORDER — INSULIN ASPART 100 UNIT/ML ~~LOC~~ SOLN
10.0000 [IU] | Freq: Once | SUBCUTANEOUS | Status: AC
  Administered 2018-07-20: 10 [IU] via INTRAVENOUS

## 2018-07-20 MED ORDER — SODIUM CHLORIDE 0.9 % IV BOLUS
2000.0000 mL | Freq: Once | INTRAVENOUS | Status: AC
  Administered 2018-07-20: 2000 mL via INTRAVENOUS

## 2018-07-20 MED ORDER — FLUTICASONE PROPIONATE 50 MCG/ACT NA SUSP: 2.0000 | Freq: Every day | NASAL | 3 refills | Status: DC

## 2018-07-20 MED ORDER — OMEPRAZOLE 40 MG PO CPDR: 40.0000 mg | DELAYED_RELEASE_CAPSULE | Freq: Every day | ORAL | 3 refills | Status: DC

## 2018-07-20 NOTE — ED Notes (Signed)
Pt given sandwich bag and iced water

## 2018-07-20 NOTE — Progress Notes (Signed)
Subjective:  Patient ID: [name], male    DOB: January 12, 1966  Age: 53 y.o. 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 His blood sugar in the clinic is greater than 600 and he endorses being out of Levemir for the last 1 month but was unaware that he had refills at his pharmacy.  Endorses drinking several cups of sweet tea this morning and lately has been drinking lots of sodas, nutritional shakes and gallons of sweet tea. Endorses excessive thirst, urinary frequency but no abdominal pain, headache, blurry vision. His blood pressure is also elevated.  He complains of heartburn but denies abdominal pain.  Denies recumbency right after meals, has no nausea, vomiting. He also complains of allergies and is requesting something for this. He has a form with him from [facility] the [place] which he will need completed So he can see a psychiatrist.  Past Medical History:  Diagnosis Date  . ADD (attention deficit disorder)   . Anxiety   . Arthritis    right hip  . Bipolar 1 disorder ([place])   . Blood in urine   . CKD (chronic kidney disease), stage III ([place])   . Creatinine elevation   . Dementia ([place])    "early onset" (08/04/2017)  . Depression    bipolar [facility]  . Diabetes mellitus without complication ([place])   . Family history of anesthesia complication    pt is unsure , but pt father may have been difficult to arouse   . HCAP (healthcare-associated pneumonia) 10/31/2012  . History of kidney stones   . Hypertension   . Hypogonadism male   . Liver fatty degeneration   . Microscopic hematuria    hereditary s/p Urology eval  . Osteoarthritis of right hip 11/28/2011   2012 2015 s/p THR Severe  Dr Novella Olive    . Pleural effusion 11/02/2012   . Pneumonia 10-2012  . Pneumonia, organism unspecified(486) 11/02/2012  . Polysubstance dependence, non-opioid, in remission ([place])    remote  . Primary osteoarthritis of left hip 05/22/2015  . PTSD (post-traumatic stress disorder)    SOCIAL ANXIETY DISORDER   . Suicide attempt by multiple drug overdose 12-29-15   Grieving his cat's death 07-14-15    Past Surgical History:  Procedure Laterality Date  . BACK SURGERY    . CLOSED REDUCTION METACARPAL WITH PERCUTANEOUS PINNING Right   . LUMBAR DISC SURGERY    . TONSILLECTOMY    . TOTAL HIP ARTHROPLASTY Right 08/16/2013   Procedure: TOTAL HIP ARTHROPLASTY ANTERIOR APPROACH;  Surgeon: Hessie Dibble, MD;  Location: [place];  Service: Orthopedics;  Laterality: Right;  . TOTAL HIP ARTHROPLASTY Left 05/22/2015   Procedure: TOTAL HIP ARTHROPLASTY ANTERIOR APPROACH;  Surgeon: Melrose Nakayama, MD;  Location: [place];  Service: Orthopedics;  Laterality: Left;    Family History  Problem Relation Age of Onset  . Diabetes Father   . Cancer Mother        died of melanoma with mets  . Cervical cancer Sister   . Diabetes Sister   . Other Neg Hx        hypogonadism    Allergies  Allergen Reactions  . Vicodin [Hydrocodone-Acetaminophen] Itching    Outpatient Medications Prior to Visit  Medication Sig Dispense Refill  .  ACCU-CHEK GUIDE test strip CHECK SUGAR FOUR TIMES DAILY 100 each 0  . acetaminophen (TYLENOL) 325 MG tablet Take 2 tablets (650 mg total) by mouth every 6 (six) hours as needed for mild pain (or Fever >/= 101).    . ARIPiprazole (ABILIFY) 5 MG tablet TAKE 1 TABLET BY MOUTH DAILY FOR DEPRESSION 30 tablet 0  . atorvastatin (LIPITOR) 10 MG tablet Take 1 tablet (10 mg total) by mouth daily. 30 tablet 2  . buPROPion (WELLBUTRIN XL) 300 MG 24 hr tablet Take 1 tablet (300 mg total) by mouth daily. 30 tablet 0  . feeding supplement, ENSURE ENLIVE, (ENSURE ENLIVE) LIQD Take 237 mLs by mouth 2 (two) times daily between meals. 237 mL 12  .  gabapentin (NEURONTIN) 300 MG capsule Take 1 capsule (300 mg total) by mouth 3 (three) times daily. 60 capsule 3  . insulin aspart (NOVOLOG) 100 UNIT/ML injection Inject 0-15 Units into the skin 3 (three) times daily with meals. 10 mL 3  . Insulin Detemir (LEVEMIR FLEXTOUCH) 100 UNIT/ML Pen Inject 30 Units into the skin daily. 30 mL 3  . lidocaine (LIDODERM) 5 % Place 1 patch onto the skin daily. Remove & Discard patch within 12 hours or as directed by MD 30 patch 0  . linagliptin (TRADJENTA) 5 MG TABS tablet Take 1 tablet (5 mg total) by mouth daily. 30 tablet 3  . QUEtiapine (SEROQUEL) 400 MG tablet Take 1 tablet (400 mg total) by mouth at bedtime. 30 tablet 11  . tadalafil (CIALIS) 5 MG tablet TAKE 1 TABLET (5 MG TOTAL) BY MOUTH DAILY for BPH 30 tablet 3  . Testosterone (ANDROGEL) 20.25 MG/1.25GM (1.62%) GEL Apply 20.25 mg topically every morning. Apply topically under arm pit alternating with each application. 1.25 g 3  . traMADol (ULTRAM) 50 MG tablet Take 1 tablet (50 mg total) by mouth at bedtime. 30 tablet 1  . traZODone (DESYREL) 150 MG tablet Take 1 tablet (150 mg total) by mouth at bedtime. 30 tablet 1  . bacitracin ointment Apply 1 application topically 3 (three) times daily. Apply to right fifth toe (Patient not taking: Reported on 07/20/2018) 120 g 0  . cyanocobalamin 500 MCG tablet Take 1 tablet (500 mcg total) by mouth daily. (Patient not taking: Reported on 07/20/2018) 30 tablet 0  . diclofenac sodium (VOLTAREN) 1 % GEL Apply 4 g topically 4 (four) times daily. (Patient not taking: Reported on 07/20/2018) 100 g 1  . divalproex (DEPAKOTE) 500 MG DR tablet Take 3 tablets (1,500 mg total) by mouth at bedtime. 90 tablet 0   No facility-administered medications prior to visit.      ROS Review of Systems  Constitutional: Negative for activity change and appetite change.  HENT: Negative for sinus pressure and sore throat.   Eyes: Negative for visual disturbance.  Respiratory: Negative  for cough, chest tightness and shortness of breath.   Cardiovascular: Negative for chest pain and leg swelling.  Gastrointestinal: Negative for abdominal distention, abdominal pain, constipation and diarrhea.  Endocrine: Negative.   Genitourinary: Negative for dysuria.  Musculoskeletal: Positive for back pain. Negative for joint swelling and myalgias.  Skin: Negative for rash.  Allergic/Immunologic: Negative.   Neurological: Negative for weakness, light-headedness and numbness.  Psychiatric/Behavioral: Negative for dysphoric mood and suicidal ideas.    Objective:  BP (!) 164/99   Pulse 92   Temp 97.7 F (36.5 C) (Oral)   Wt 214 lb (97.1 kg)   SpO2 96%   BMI 26.05 kg/m  BP/Weight 07/20/2018 05/12/2018 8/67/6195  Systolic BP 093 267 124  Diastolic BP 99 84 73  Wt. (Lbs) 214 219.4 -  BMI 26.05 26.71 -      Physical Exam Constitutional:      Appearance: He is well-developed.  Cardiovascular:     Rate and Rhythm: Normal rate.     Heart sounds: Normal heart sounds. No murmur.  Pulmonary:     Effort: Pulmonary effort is normal.     Breath sounds: Normal breath sounds. No wheezing or rales.  Chest:     Chest wall: No tenderness.  Abdominal:     General: Bowel sounds are normal. There is no distension.     Palpations: Abdomen is soft. There is no mass.     Tenderness: There is no abdominal tenderness.  Musculoskeletal: Normal range of motion.  Neurological:     Mental Status: He is alert and oriented to person, place, and time.     CMP Latest Ref Rng & Units 05/12/2018 08/11/2017 08/09/2017  Glucose 65 - 99 mg/dL 231(H) 130(H) -  BUN 6 - 24 mg/dL 19 32(H) -  Creatinine 0.76 - 1.27 mg/dL 2.17(H) 1.53(H) 1.57(H)  Sodium 134 - 144 mmol/L 137 137 -  Potassium 3.5 - 5.2 mmol/L 4.6 4.4 -  Chloride 96 - 106 mmol/L 100 101 -  CO2 20 - 29 mmol/L 23 25 -  Calcium 8.7 - 10.2 mg/dL 9.7 9.4 -  Total Protein 6.0 - 8.5 g/dL 7.6 - -  Total Bilirubin 0.0 - 1.2 mg/dL 0.3 - -  Alkaline  Phos 39 - 117 IU/L 102 - -  AST 0 - 40 IU/L 24 - -  ALT 0 - 44 IU/L 15 - -    Lipid Panel     Component Value Date/Time   CHOL 141 02/10/2017 0953   TRIG 259 (H) 02/10/2017 0953   HDL 34 (L) 02/10/2017 0953   CHOLHDL 4.1 02/10/2017 0953   CHOLHDL 7 06/02/2016 0844   VLDL 41 (H) 10/03/2015 0434   LDLCALC 55 02/10/2017 0953   LDLDIRECT 72.0 06/02/2016 0844    CBC    Component Value Date/Time   WBC 6.8 05/12/2018 1647   WBC 7.3 08/11/2017 0615   RBC 5.07 05/12/2018 1647   RBC 4.22 08/11/2017 0615   HGB 16.5 05/12/2018 1647   HCT 47.9 05/12/2018 1647   PLT 123 (L) 05/12/2018 1647   MCV 95 05/12/2018 1647   MCH 32.5 05/12/2018 1647   MCH 30.1 08/11/2017 0615   MCHC 34.4 05/12/2018 1647   MCHC 34.1 08/11/2017 0615   RDW 15.4 05/12/2018 1647   LYMPHSABS 2.3 05/12/2018 1647   MONOABS 1.1 (H) 08/02/2017 0346   EOSABS 0.0 05/12/2018 1647   BASOSABS 0.0 05/12/2018 1647    Lab Results  Component Value Date   HGBA1C 7.5 (H) 05/12/2018    Assessment & Plan:   1. Type 2 diabetes mellitus with hyperglycemia, with long-term current use of insulin (HCC) A1c of 7.5 from 04/2018 Blood glucose greater than 600 due to noncompliance-referred to the ED stat Advised once he gets discharged from the ED he has refills at the pharmacy which he needs to pick up - POCT glucose (manual entry) - POCT URINALYSIS DIP (CLINITEK)  2. Gastroesophageal reflux disease without esophagitis Avoid late meals and recumbency shortly after eating; allow for up to 2 hours after meals prior to lying down - omeprazole (PRILOSEC) 40 MG capsule; Take 1 capsule (40 mg total) by mouth daily.  Dispense:  30 capsule; Refill: 3  3. Seasonal allergies - fluticasone (FLONASE) 50 MCG/ACT nasal spray; Place 2 sprays into both nostrils daily.  Dispense: 16 g; Refill: 3  4. Schizoaffective disorder, bipolar type ([place]) Under the care of [facility] services [facility] the [place] - Ambulatory referral to Psychiatry   Meds  ordered this encounter  Medications  . omeprazole (PRILOSEC) 40 MG capsule    Sig: Take 1 capsule (40 mg total) by mouth daily.    Dispense:  30 capsule    Refill:  3  . fluticasone (FLONASE) 50 MCG/ACT nasal spray    Sig: Place 2 sprays into both nostrils daily.    Dispense:  16 g    Refill:  3    Follow-up: Return in about 1 month (around 08/19/2018) for Diabetes mellitus - virtual visit.       [name], MD, FAAFP. [facility] and [place], [place]   07/20/2018, 3:39 PM

## 2018-07-20 NOTE — ED Provider Notes (Signed)
[facility] EMERGENCY DEPARTMENT Provider Note   CSN: [id number] Arrival date & time: 07/20/18  1619    History   Chief Complaint Chief Complaint  Patient presents with  . Hyperglycemia    HPI [name] is a 53 y.o. male.     HPI 53 yo m with extensive PMHx as below here with hyperglycemia. Pt admits he has been "bad" about his diabetes lately and has not regularly been taking his meds, and has been out of his short-acting insulin. This has been for "a month or two." over the past mo, he's had worsening polyuria, fatigue, polydipsia. Went ot see his PCP today and was sent here after BG>600. No fever, chills, or infectious symptoms. No CP, SOB. He denies any other complaints. He state she has felt thirsty "all the time" and is peeing "non stop." No dysuria. No h/o DKA per his report.  Past Medical History:  Diagnosis Date  . ADD (attention deficit disorder)   . Anxiety   . Arthritis    right hip  . Bipolar 1 disorder ([place])   . Blood in urine   . CKD (chronic kidney disease), stage III ([place])   . Creatinine elevation   . Dementia ([place])    "early onset" (08/04/2017)  . Depression    bipolar [facility]  . Diabetes mellitus without complication ([place])   . Family history of anesthesia complication    pt is unsure , but pt father may have been difficult to arouse   . HCAP (healthcare-associated pneumonia) 10/31/2012  . History of kidney stones   . Hypertension   . Hypogonadism male   . Liver fatty degeneration   . Microscopic hematuria    hereditary s/p Urology eval  . Osteoarthritis of right hip 11/28/2011   2012 2015 s/p THR Severe  Dr Novella Olive    . Pleural effusion 11/02/2012  . Pneumonia 10-2012  . Pneumonia, organism unspecified(486) 11/02/2012  . Polysubstance dependence, non-opioid, in remission ([place])    remote  . Primary osteoarthritis of left hip 05/22/2015  . PTSD (post-traumatic stress disorder)    SOCIAL ANXIETY DISORDER   . Suicide  attempt by multiple drug overdose 2016-01-14   Grieving his cat's death July 30, 2015    Patient Active Problem List   Diagnosis Date Noted  . Closed displaced fracture of lateral malleolus of left fibula 08/02/2017  . BPH (benign prostatic hyperplasia) 07/24/2017  . Hemorrhoid 05/27/2017  . Shoulder arthritis 05/27/2017  . Diabetic neuropathy ([place]) 05/27/2017  . Degenerative disc disease, lumbar 02/04/2017  . Constipation   . History of seizures 09/20/2016  . Dementia, early onset with advanced brain atrophy for age 75/05/2016  . Acute lower UTI   . Urinary retention   . Acute metabolic encephalopathy 29/93/7169  . HLD (hyperlipidemia) 09/07/2016  . GERD (gastroesophageal reflux disease) 09/07/2016  . PTSD (post-traumatic stress disorder) 09/07/2016  . Acute encephalopathy 09/07/2016  . Fall 09/07/2016  . Chronic kidney disease   . Ataxia 10/14/2015  . Hypokalemia 10/03/2015  . Hemiparesis ([place])   . Dyslipidemia 05/31/2014  . Insomnia 11/11/2013  . Degenerative joint disease (DJD) of hip 08/16/2013  . Erectile dysfunction 02/17/2011  . Constipation - functional 02/17/2011  . Diabetes type 2, uncontrolled ([place]) 11/06/2010  . Hypogonadism male 05/20/2010  . TOBACCO USER 05/20/2010  . Demoralization and apathy 05/20/2010  . Schizoaffective disorder, bipolar type ([place]) 01/18/2010  . Right shoulder pain 01/18/2010  . Anxiety state 12/05/2006  . Essential hypertension 12/05/2006  Past Surgical History:  Procedure Laterality Date  . BACK SURGERY    . CLOSED REDUCTION METACARPAL WITH PERCUTANEOUS PINNING Right   . LUMBAR DISC SURGERY    . TONSILLECTOMY    . TOTAL HIP ARTHROPLASTY Right 08/16/2013   Procedure: TOTAL HIP ARTHROPLASTY ANTERIOR APPROACH;  Surgeon: Hessie Dibble, MD;  Location: [facility];  Service: Orthopedics;  Laterality: Right;  . TOTAL HIP ARTHROPLASTY Left 05/22/2015   Procedure: TOTAL HIP ARTHROPLASTY ANTERIOR APPROACH;  Surgeon: Melrose Nakayama, MD;  Location: [facility];   Service: Orthopedics;  Laterality: Left;        Home Medications    Prior to Admission medications   Medication Sig Start Date End Date Taking? Authorizing Provider  ARIPiprazole (ABILIFY) 5 MG tablet TAKE 1 TABLET BY MOUTH DAILY FOR DEPRESSION Patient taking differently: Take 5 mg by mouth daily.  06/23/18  Yes [name], MD  atorvastatin (LIPITOR) 10 MG tablet Take 1 tablet (10 mg total) by mouth daily. 06/14/18  Yes Newlin, Charlane Ferretti, MD  buPROPion (WELLBUTRIN XL) 300 MG 24 hr tablet Take 1 tablet (300 mg total) by mouth daily. 10/13/16  Yes Elgergawy, Silver Huguenin, MD  divalproex (DEPAKOTE) 500 MG DR tablet Take 3 tablets (1,500 mg total) by mouth at bedtime. 10/13/16 07/20/18 Yes Elgergawy, Silver Huguenin, MD  gabapentin (NEURONTIN) 300 MG capsule Take 1 capsule (300 mg total) by mouth 3 (three) times daily. 05/12/18  Yes Newlin, Charlane Ferretti, MD  insulin aspart (NOVOLOG) 100 UNIT/ML injection Inject 0-15 Units into the skin 3 (three) times daily with meals. 05/12/18  Yes [name], MD  Insulin Detemir (LEVEMIR FLEXTOUCH) 100 UNIT/ML Pen Inject 30 Units into the skin daily. Patient taking differently: Inject 30 Units into the skin 2 (two) times daily.  05/12/18  Yes [name], MD  linagliptin (TRADJENTA) 5 MG TABS tablet Take 1 tablet (5 mg total) by mouth daily. 05/12/18  Yes [name], MD  QUEtiapine (SEROQUEL) 400 MG tablet Take 1 tablet (400 mg total) by mouth at bedtime. 10/13/16  Yes Elgergawy, Silver Huguenin, MD  traMADol (ULTRAM) 50 MG tablet Take 1 tablet (50 mg total) by mouth at bedtime. 05/12/18  Yes [name], MD  traZODone (DESYREL) 150 MG tablet Take 1 tablet (150 mg total) by mouth at bedtime. 10/13/16  Yes Elgergawy, Silver Huguenin, MD  ACCU-CHEK GUIDE test strip CHECK SUGAR FOUR TIMES DAILY 06/23/18   [name], MD  acetaminophen (TYLENOL) 325 MG tablet Take 2 tablets (650 mg total) by mouth every 6 (six) hours as needed for mild pain (or Fever >/= 101). Patient not taking:  Reported on 07/20/2018 08/11/17   Elgergawy, Silver Huguenin, MD  bacitracin ointment Apply 1 application topically 3 (three) times daily. Apply to right fifth toe Patient not taking: Reported on 07/20/2018 08/11/17   Elgergawy, Silver Huguenin, MD  cyanocobalamin 500 MCG tablet Take 1 tablet (500 mcg total) by mouth daily. Patient not taking: Reported on 07/20/2018 10/13/16   Elgergawy, Silver Huguenin, MD  diclofenac sodium (VOLTAREN) 1 % GEL Apply 4 g topically 4 (four) times daily. Patient not taking: Reported on 07/20/2018 02/04/17   [name], MD  feeding supplement, ENSURE ENLIVE, (ENSURE ENLIVE) LIQD Take 237 mLs by mouth 2 (two) times daily between meals. 08/11/17   Elgergawy, Silver Huguenin, MD  fluticasone (FLONASE) 50 MCG/ACT nasal spray Place 2 sprays into both nostrils daily. 07/20/18   [name], MD  lidocaine (LIDODERM) 5 % Place 1 patch onto the skin daily. Remove & Discard patch  within 12 hours or as directed by MD Patient not taking: Reported on 07/20/2018 05/12/18   [name], MD  omeprazole (PRILOSEC) 40 MG capsule Take 1 capsule (40 mg total) by mouth daily. 07/20/18   [name], MD  tadalafil (CIALIS) 5 MG tablet TAKE 1 TABLET (5 MG TOTAL) BY MOUTH DAILY for BPH Patient not taking: Reported on 07/20/2018 06/15/18   [name], MD  Testosterone (ANDROGEL) 20.25 MG/1.25GM (1.62%) GEL Apply 20.25 mg topically every morning. Apply topically under arm pit alternating with each application. Patient not taking: Reported on 07/20/2018 05/12/18   [name], MD    Family History Family History  Problem Relation Age of Onset  . Diabetes Father   . Cancer Mother        died of melanoma with mets  . Cervical cancer Sister   . Diabetes Sister   . Other Neg Hx        hypogonadism    Social History Social History   Tobacco Use  . Smoking status: Former Smoker    Years: 0.00    Types: Cigarettes    Last attempt to quit: 02/19/2014    Years since quitting: 4.4  . Smokeless tobacco:  Never Used  Substance Use Topics  . Alcohol use: No  . Drug use: No    Comment: hx of marijuana/cocaine/crack use but sober since 20's     Allergies   Vicodin [hydrocodone-acetaminophen]   Review of Systems Review of Systems  Constitutional: Positive for fatigue. Negative for chills and fever.  HENT: Negative for congestion and rhinorrhea.   Eyes: Negative for visual disturbance.  Respiratory: Negative for cough, shortness of breath and wheezing.   Cardiovascular: Negative for chest pain and leg swelling.  Gastrointestinal: Negative for abdominal pain, diarrhea, nausea and vomiting.  Endocrine: Positive for polydipsia and polyuria.  Genitourinary: Negative for dysuria and flank pain.  Musculoskeletal: Negative for neck pain and neck stiffness.  Skin: Negative for rash and wound.  Allergic/Immunologic: Negative for immunocompromised state.  Neurological: Positive for weakness. Negative for syncope and headaches.  All other systems reviewed and are negative.    Physical Exam Updated Vital Signs BP (!) 145/86   Pulse 76   Temp 98.3 F (36.8 C) (Oral)   Resp 14   Ht 6\' 4"  (1.93 m)   Wt 97.1 kg   SpO2 100%   BMI 26.05 kg/m   Physical Exam Vitals signs and nursing note reviewed.  Constitutional:      General: He is not in acute distress.    Appearance: He is well-developed.  HENT:     Head: Normocephalic and atraumatic.     Mouth/Throat:     Mouth: Mucous membranes are dry.  Eyes:     Conjunctiva/sclera: Conjunctivae normal.  Neck:     Musculoskeletal: Neck supple.  Cardiovascular:     Rate and Rhythm: Normal rate and regular rhythm.     Heart sounds: Normal heart sounds. No murmur. No friction rub.  Pulmonary:     Effort: Pulmonary effort is normal. No respiratory distress.     Breath sounds: Normal breath sounds. No wheezing or rales.  Abdominal:     General: There is no distension.     Palpations: Abdomen is soft.     Tenderness: There is no abdominal  tenderness.  Skin:    General: Skin is warm.     Capillary Refill: Capillary refill takes less than 2 seconds.  Neurological:     Mental Status: He  is alert and oriented to person, place, and time.     Motor: No abnormal muscle tone.      ED Treatments / Results  Labs (all labs ordered are listed, but only abnormal results are displayed) Labs Reviewed  BASIC METABOLIC PANEL - Abnormal; Notable for the following components:      Result Value   Sodium 124 (*)    Chloride 93 (*)    CO2 20 (*)    Glucose, Bld 588 (*)    BUN 29 (*)    Creatinine, Ser 1.78 (*)    GFR calc non Af Amer 44 (*)    GFR calc Af Amer 50 (*)    All other components within normal limits  CBC WITH DIFFERENTIAL/PLATELET - Abnormal; Notable for the following components:   MCHC 36.6 (*)    Platelets 120 (*)    All other components within normal limits  URINALYSIS, ROUTINE W REFLEX MICROSCOPIC - Abnormal; Notable for the following components:   Color, Urine STRAW (*)    Glucose, UA >=500 (*)    Hgb urine dipstick SMALL (*)    All other components within normal limits  CBG MONITORING, ED - Abnormal; Notable for the following components:   Glucose-Capillary >600 (*)    All other components within normal limits  POCT I-STAT EG7 - Abnormal; Notable for the following components:   pCO2, Ven 36.7 (*)    pO2, Ven 88.0 (*)    Sodium 125 (*)    All other components within normal limits  CBG MONITORING, ED - Abnormal; Notable for the following components:   Glucose-Capillary 297 (*)    All other components within normal limits    EKG EKG Interpretation  Date/Time:  Tuesday July 20 2018 16:25:12 EDT Ventricular Rate:  88 PR Interval:    QRS Duration: 84 QT Interval:  349 QTC Calculation: 423 R Axis:   72 Text Interpretation:  Sinus rhythm No significant change since last tracing Confirmed by Duffy Bruce [id number]) on 07/20/2018 4:41:41 PM   Radiology No results found.  Procedures Procedures  (including critical care time)  Medications Ordered in ED Medications  sodium chloride 0.9 % bolus 2,000 mL (0 mLs Intravenous Stopped 07/20/18 1700)  insulin aspart (novoLOG) injection 10 Units (10 Units Intravenous Given 07/20/18 1801)     Initial Impression / Assessment and Plan / ED Course  I have reviewed the triage vital signs and the nursing notes.  Pertinent labs & imaging results that were available during my care of the patient were reviewed by me and considered in my medical decision making (see chart for details).        53 yo M here with polyuria, polydipsia, polyphagia, fatigue. Labs c/w hyperglycemia without AG or signs of DKA on blood gas. CO2 20, normal AG. UA without ketones but with glucosuria. Pt feels improve with IVF, insulin here. His insulin has been called in to his PCP. This is likely 2/2 non adherence and he has no apparent infectious, ischemic, or other medical triggers. Encouraged strict follow-up and adherence with his insulin regimen. D/c home.  Final Clinical Impressions(s) / ED Diagnoses   Final diagnoses:  Hyperglycemia    ED Discharge Orders    None       Duffy Bruce, MD 07/20/18 [phone]

## 2018-07-20 NOTE — Discharge Instructions (Addendum)
It is critically important that you fill your medications and take them as prescribed  Try to avoid high carb or sugary foods  Drink plenty of fluids

## 2018-07-20 NOTE — ED Triage Notes (Signed)
Per [facility] pt went to PCP for a routine visit when CBG read 600, called EMS. EMS reports CBG of 575 given 100CC NS. Patient states out of diabetes medication x 2 weeks. C/o increased thirst and urine. Patient alert and orientated x4. Patient reports increased falls.

## 2018-07-21 ENCOUNTER — Encounter: Payer: Self-pay | Admitting: Family Medicine

## 2018-07-21 ENCOUNTER — Other Ambulatory Visit: Payer: Self-pay | Admitting: Family Medicine

## 2018-07-21 DIAGNOSIS — E11649 Type 2 diabetes mellitus with hypoglycemia without coma: Secondary | ICD-10-CM

## 2018-07-21 MED ORDER — INSULIN ASPART 100 UNIT/ML ~~LOC~~ SOLN: 0.0000 [IU] | Freq: Three times a day (TID) | SUBCUTANEOUS | 3 refills | Status: DC

## 2018-07-22 ENCOUNTER — Telehealth: Payer: Self-pay | Admitting: Family Medicine

## 2018-07-22 DIAGNOSIS — E11649 Type 2 diabetes mellitus with hypoglycemia without coma: Secondary | ICD-10-CM

## 2018-07-22 DIAGNOSIS — F4310 Post-traumatic stress disorder, unspecified: Secondary | ICD-10-CM | POA: Diagnosis not present

## 2018-07-22 MED ORDER — INSULIN DETEMIR 100 UNIT/ML FLEXPEN: 30.0000 [IU] | PEN_INJECTOR | Freq: Every day | SUBCUTANEOUS | 2 refills | Status: DC

## 2018-07-22 MED ORDER — GLUCOSE BLOOD VI STRP: ORAL_STRIP | 12 refills | Status: AC

## 2018-07-22 NOTE — Telephone Encounter (Signed)
1) Medication(s) Requested (by name): Test strips for  Insulin Detemir (LEVEMIR FLEXTOUCH) Pen   2) Pharmacy of Choice: [facility], [place] - [address] 3) Special Requests:   Approved medications will be sent to the pharmacy, we will reach out if there is an issue.  Requests made after 3pm may not be addressed until the following business day!  If a patient is unsure of the name of the medication(s) please note and ask patient to call back when they are able to provide all info, do not send to responsible party until all information is available!

## 2018-07-23 ENCOUNTER — Encounter: Payer: Self-pay | Admitting: Family Medicine

## 2018-07-23 ENCOUNTER — Other Ambulatory Visit: Payer: Self-pay | Admitting: Family Medicine

## 2018-07-23 ENCOUNTER — Telehealth: Payer: Self-pay | Admitting: Licensed Clinical Social Worker

## 2018-07-23 MED ORDER — INSULIN ASPART 100 UNIT/ML FLEXPEN: PEN_INJECTOR | SUBCUTANEOUS | 11 refills | Status: DC

## 2018-07-23 MED ORDER — INSULIN PEN NEEDLE 31G X 5 MM MISC: 1.0000 | Freq: Four times a day (QID) | 5 refills | Status: DC

## 2018-07-23 NOTE — Telephone Encounter (Signed)
Thank Stormy Fabian  I sent a fax yesterday  .

## 2018-07-23 NOTE — Telephone Encounter (Signed)
Call placed to [facility]. LCSWA spoke with Emay [phone] who confirmed receipt of referral for psychiatry. Pt will be contacted for services.

## 2018-08-06 ENCOUNTER — Other Ambulatory Visit: Payer: Self-pay | Admitting: Family Medicine

## 2018-08-06 DIAGNOSIS — E11649 Type 2 diabetes mellitus with hypoglycemia without coma: Secondary | ICD-10-CM

## 2018-08-10 ENCOUNTER — Telehealth: Payer: Self-pay | Admitting: Family Medicine

## 2018-08-10 ENCOUNTER — Other Ambulatory Visit: Payer: Self-pay

## 2018-08-10 ENCOUNTER — Ambulatory Visit: Payer: Medicare HMO | Attending: Family Medicine | Admitting: Family Medicine

## 2018-08-10 DIAGNOSIS — F250 Schizoaffective disorder, bipolar type: Secondary | ICD-10-CM | POA: Diagnosis not present

## 2018-08-10 DIAGNOSIS — M5136 Other intervertebral disc degeneration, lumbar region: Secondary | ICD-10-CM

## 2018-08-10 DIAGNOSIS — N401 Enlarged prostate with lower urinary tract symptoms: Secondary | ICD-10-CM | POA: Diagnosis not present

## 2018-08-10 DIAGNOSIS — F17200 Nicotine dependence, unspecified, uncomplicated: Secondary | ICD-10-CM

## 2018-08-10 DIAGNOSIS — E11649 Type 2 diabetes mellitus with hypoglycemia without coma: Secondary | ICD-10-CM | POA: Diagnosis not present

## 2018-08-10 DIAGNOSIS — F17210 Nicotine dependence, cigarettes, uncomplicated: Secondary | ICD-10-CM

## 2018-08-10 DIAGNOSIS — F411 Generalized anxiety disorder: Secondary | ICD-10-CM

## 2018-08-10 DIAGNOSIS — R338 Other retention of urine: Secondary | ICD-10-CM | POA: Diagnosis not present

## 2018-08-10 MED ORDER — VARENICLINE TARTRATE 1 MG PO TABS: 1.0000 mg | ORAL_TABLET | Freq: Two times a day (BID) | ORAL | 2 refills | Status: DC

## 2018-08-10 MED ORDER — TRAMADOL HCL 50 MG PO TABS: 50.0000 mg | ORAL_TABLET | Freq: Every day | ORAL | 2 refills | Status: DC

## 2018-08-10 MED ORDER — VARENICLINE TARTRATE 0.5 MG X 11 & 1 MG X 42 PO MISC: ORAL | 0 refills | Status: DC

## 2018-08-10 MED ORDER — ATORVASTATIN CALCIUM 10 MG PO TABS: 10.0000 mg | ORAL_TABLET | Freq: Every day | ORAL | 2 refills | Status: DC

## 2018-08-10 MED ORDER — TADALAFIL 5 MG PO TABS: ORAL_TABLET | ORAL | 3 refills | Status: DC

## 2018-08-10 MED ORDER — INSULIN DETEMIR 100 UNIT/ML FLEXPEN: PEN_INJECTOR | SUBCUTANEOUS | 2 refills | Status: DC

## 2018-08-10 MED ORDER — METHOCARBAMOL 500 MG PO TABS: 1000.0000 mg | ORAL_TABLET | Freq: Four times a day (QID) | ORAL | 3 refills | Status: DC

## 2018-08-10 MED ORDER — GABAPENTIN 300 MG PO CAPS: 300.0000 mg | ORAL_CAPSULE | Freq: Three times a day (TID) | ORAL | 3 refills | Status: DC

## 2018-08-10 MED ORDER — LINAGLIPTIN 5 MG PO TABS: 5.0000 mg | ORAL_TABLET | Freq: Every day | ORAL | 3 refills | Status: DC

## 2018-08-10 NOTE — Telephone Encounter (Signed)
Hi [name], can you please reach out to his therapist at [facility] [place] as his anxiety and depression are worsening due to the [place] pandemic.  Are you aware of any assistance or community resources for patients like him with panic attacks when they go grocery shopping or persons who can assist with grocery shopping?  Thank you

## 2018-08-10 NOTE — Progress Notes (Signed)
Virtual Visit via Telephone Note  I connected with [name], on 08/10/2018 at 3:09 PM by telephone and verified that I am speaking with the correct person using two identifiers.   Consent: I discussed the limitations, risks, security and privacy concerns of performing an evaluation and management service by telephone and the availability of in person appointments. I also discussed with the patient that there may be a patient responsible charge related to this service. The patient expressed understanding and agreed to proceed.   Location of Patient: Patient's home  Location of Provider: Clinic   Persons participating in Telemedicine visit: Dilan Novosad Farrington-CMA Dr. 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 His blood sugars have been running between 250 and 350 and this morning his fasting blood sugar was 150.  He has been on 30 units of Levemir twice a day in addition to his sliding scale and Tradjenta and informs me he is working on cutting out juices and now drinks 90% water which is an improvement from his last visit when he informed me he drank lots of sweet tea and sweet drinks.  He complains of severe back pain as a result of running out of his gabapentin and his tramadol. On further questioning he states " I have had better days".  He is experiencing panic attacks and worsening of his anxiety and depression and with his schizophrenia he is paranoid especially with the COVID-19 pandemic.  Whenever he gets into the store he is looking around afraid someone might cough in his face or touch him and this causes him to be unstable in his feet and almost tripping over.  Going to the grocery store is a major scare for him and he  relies on his family to transport him to the grocery store but they will not do his groceries for him.  He is currently followed by [facility] where he sees a psychiatrist and psychotherapist and has informed them that his symptoms are not fully optimized but has been maintained on his current medications.  He has an upcoming appointment next week. He is requesting Klonopin for his anxiety as he received that while he was in rehab.    Past Medical History:  Diagnosis Date  . ADD (attention deficit disorder)   . Anxiety   . Arthritis    right hip  . Bipolar 1 disorder ([place])   . Blood in urine   . CKD (chronic kidney disease), stage III ([place])   . Creatinine elevation   . Dementia ([place])    "early onset" (08/04/2017)  . Depression    bipolar [facility]  . Diabetes mellitus without complication ([place])   . Family history of anesthesia complication    pt is unsure , but pt father may have been difficult to arouse   . HCAP (healthcare-associated pneumonia) 10/31/2012  . History of kidney stones   . Hypertension   . Hypogonadism male   . Liver fatty degeneration   . Microscopic hematuria    hereditary s/p Urology eval  . Osteoarthritis of right hip 11/28/2011   2012 2015 s/p THR Severe  Dr Novella Olive    . Pleural effusion 11/02/2012  . Pneumonia 10-2012  . Pneumonia, organism unspecified(486) 11/02/2012  .  Polysubstance dependence, non-opioid, in remission ([place])    remote  . Primary osteoarthritis of left hip 05/22/2015  . PTSD (post-traumatic stress disorder)    SOCIAL ANXIETY DISORDER   . Suicide attempt by multiple drug overdose Jan 16, 2016   Grieving his cat's death 2015/08/01   Allergies  Allergen Reactions  . Vicodin [Hydrocodone-Acetaminophen] Itching    Current Outpatient Medications on File Prior to Visit  Medication Sig Dispense Refill  . ARIPiprazole (ABILIFY) 5 MG tablet TAKE 1 TABLET BY MOUTH DAILY FOR DEPRESSION (Patient taking differently: Take 5 mg by  mouth daily. ) 30 tablet 0  . atorvastatin (LIPITOR) 10 MG tablet Take 1 tablet (10 mg total) by mouth daily. 30 tablet 2  . buPROPion (WELLBUTRIN XL) 300 MG 24 hr tablet Take 1 tablet (300 mg total) by mouth daily. 30 tablet 0  . feeding supplement, ENSURE ENLIVE, (ENSURE ENLIVE) LIQD Take 237 mLs by mouth 2 (two) times daily between meals. 237 mL 12  . fluticasone (FLONASE) 50 MCG/ACT nasal spray Place 2 sprays into both nostrils daily. 16 g 3  . gabapentin (NEURONTIN) 300 MG capsule Take 1 capsule (300 mg total) by mouth 3 (three) times daily. 60 capsule 3  . glucose blood (ACCU-CHEK GUIDE) test strip CHECK SUGAR FOUR TIMES DAILY 100 each 12  . insulin aspart (NOVOLOG FLEXPEN) 100 UNIT/ML FlexPen 0 to 12 units subcutaneously 3 times daily as per sliding scale 15 mL 11  . Insulin Pen Needle 31G X 5 MM MISC 1 each by Does not apply route 4 (four) times daily. 120 each 5  . linagliptin (TRADJENTA) 5 MG TABS tablet Take 1 tablet (5 mg total) by mouth daily. 30 tablet 3  . QUEtiapine (SEROQUEL) 400 MG tablet Take 1 tablet (400 mg total) by mouth at bedtime. 30 tablet 11  . tadalafil (CIALIS) 5 MG tablet TAKE 1 TABLET (5 MG TOTAL) BY MOUTH DAILY for BPH 30 tablet 3  . traZODone (DESYREL) 150 MG tablet Take 1 tablet (150 mg total) by mouth at bedtime. 30 tablet 1  . acetaminophen (TYLENOL) 325 MG tablet Take 2 tablets (650 mg total) by mouth every 6 (six) hours as needed for mild pain (or Fever >/= 101). (Patient not taking: Reported on 07/20/2018)    . bacitracin ointment Apply 1 application topically 3 (three) times daily. Apply to right fifth toe (Patient not taking: Reported on 07/20/2018) 120 g 0  . cyanocobalamin 500 MCG tablet Take 1 tablet (500 mcg total) by mouth daily. (Patient not taking: Reported on 07/20/2018) 30 tablet 0  . diclofenac sodium (VOLTAREN) 1 % GEL Apply 4 g topically 4 (four) times daily. (Patient not taking: Reported on 07/20/2018) 100 g 1  . divalproex (DEPAKOTE) 500 MG DR  tablet Take 3 tablets (1,500 mg total) by mouth at bedtime. 90 tablet 0  . lidocaine (LIDODERM) 5 % Place 1 patch onto the skin daily. Remove & Discard patch within 12 hours or as directed by MD (Patient not taking: Reported on 07/20/2018) 30 patch 0  . omeprazole (PRILOSEC) 40 MG capsule Take 1 capsule (40 mg total) by mouth daily. (Patient not taking: Reported on 08/10/2018) 30 capsule 3  . Testosterone (ANDROGEL) 20.25 MG/1.25GM (1.62%) GEL Apply 20.25 mg topically every morning. Apply topically under arm pit alternating with each application. (Patient not taking: Reported on 07/20/2018) 1.25 g 3   No current facility-administered medications on file prior to visit.     Observations/Objective: Awake, alert, oriented x3 Not in acute distress  Dysphoric mood  CMP Latest Ref Rng & Units 07/20/2018 07/20/2018 05/12/2018  Glucose 70 - 99 mg/dL - 588(HH) 231(H)  BUN 6 - 20 mg/dL - 29(H) 19  Creatinine 0.61 - 1.24 mg/dL - 1.78(H) 2.17(H)  Sodium 135 - 145 mmol/L 125(L) 124(L) 137  Potassium 3.5 - 5.1 mmol/L 4.8 4.6 4.6  Chloride 98 - 111 mmol/L - 93(L) 100  CO2 22 - 32 mmol/L - 20(L) 23  Calcium 8.9 - 10.3 mg/dL - 8.9 9.7  Total Protein 6.0 - 8.5 g/dL - - 7.6  Total Bilirubin 0.0 - 1.2 mg/dL - - 0.3  Alkaline Phos 39 - 117 IU/L - - 102  AST 0 - 40 IU/L - - 24  ALT 0 - 44 IU/L - - 15    Lipid Panel     Component Value Date/Time   CHOL 141 02/10/2017 0953   TRIG 259 (H) 02/10/2017 0953   HDL 34 (L) 02/10/2017 0953   CHOLHDL 4.1 02/10/2017 0953   CHOLHDL 7 06/02/2016 0844   VLDL 41 (H) 10/03/2015 0434   LDLCALC 55 02/10/2017 0953   LDLDIRECT 72.0 06/02/2016 0844    Lab Results  Component Value Date   HGBA1C 7.5 (H) 05/12/2018     Assessment and Plan: 1. Degenerative disc disease, lumbar Uncontrolled as he has been out of tramadol Refill tramadol and Robaxin Continue gabapentin - traMADol (ULTRAM) 50 MG tablet; Take 1 tablet (50 mg total) by mouth at bedtime.  Dispense: 30  tablet; Refill: 2 - methocarbamol (ROBAXIN) 500 MG tablet; Take 2 tablets (1,000 mg total) by mouth 4 (four) times daily.  Dispense: 120 tablet; Refill: 3  2. Uncontrolled type 2 diabetes mellitus with hypoglycemia, unspecified hypoglycemia coma status ([place]) Uncontrolled with A1c of 7.5 This is not a true reflection of his glycemic control as he has had hyperglycemia since his A1c in 04/2018. He is due for A1c at this time however I will hold off on bringing him into the office due to the ongoing pandemic. Increased Levemir to 35 units in the morning and 30 units in the evening and he has been educated on titrating his dose by 2 units before the day until blood sugars are at goal Noncompliance with a diabetic diet has been a major problem and he is working on this Continue sliding-scale insulin - Insulin Detemir (LEVEMIR FLEXTOUCH) 100 UNIT/ML Pen; Inject twice daily subcutaneously  Dispense: 15 mL; Refill: 2  3. Schizoaffective disorder, bipolar type ([place]) Uncontrolled This is worsened by the ongoing pandemic Currently on Seroquel, trazodone, Depakote, Wellbutrin Has an upcoming appointment with [facility]-his psychiatrist and therapist next week  4. TOBACCO USER Spent 3 minutes counseling on smoking cessation and he is willing to work on quitting Prescribed Chantix  5.  Anxiety Uncontrolled with increasing panic attacks due to COVID-19 pandemic He is requesting Klonopin and I have informed him unfortunately I will be unable to prescribe that; his psychiatrist has also declined He will benefit from a medication like BuSpar or hydroxyzine and I will defer to his psychiatrist to manage that for him. He has a history of suicidal attempt in 2017 and is a high risk patient I will reach out to our social worker in the event that she may have resources with regards to grocery shopping for him since he is afraid of going out. We will also have our LCSW reach out to his  clinicians at the [facility] [place] as he will need  close monitoring given he is high risk.  Follow Up Instructions: 3 months   I discussed the assessment and treatment plan with the patient. The patient was provided an opportunity to ask questions and all were answered. The patient agreed with the plan and demonstrated an understanding of the instructions.   The patient was advised to call back or seek an in-person evaluation if the symptoms worsen or if the condition fails to improve as anticipated.     I provided 35 minutes total of non-face-to-face time during this encounter including median intraservice time, reviewing previous notes, labs, imaging, medications and explaining diagnosis and management.     [name], MD, FAAFP. [facility] [place], [place]   08/10/2018, 3:09 PM

## 2018-08-10 NOTE — Progress Notes (Signed)
Patient has been called and DOB has been verified. Patient has been screened and transferred to PCP to start phone visit.  C/C: diabetes

## 2018-08-16 ENCOUNTER — Telehealth: Payer: Self-pay | Admitting: Licensed Clinical Social Worker

## 2018-08-16 NOTE — Telephone Encounter (Signed)
LCSWA attempted to contact pt to follow up on behavioral health referral from PCP. LCSWA left message for a return call.  

## 2018-08-16 NOTE — Telephone Encounter (Signed)
Call placed to Ms. Garfield Cornea [phone] with [facility] to provide update on pt's mental health and increase in symptoms. LCSWA left a detailed message requesting a call back.

## 2018-08-17 DIAGNOSIS — F431 Post-traumatic stress disorder (PTSD): Secondary | ICD-10-CM | POA: Diagnosis not present

## 2018-08-24 ENCOUNTER — Telehealth: Payer: Self-pay | Admitting: Licensed Clinical Social Worker

## 2018-08-24 NOTE — Telephone Encounter (Signed)
Return call received from Garfield Cornea, Psychotherapist at [facility]. LCSWA introduced self and informed [name] of pt's increase in anxiety due to COVID19 pandemic and increase in paranoid behavior.   [name] was appreciative for the updated information. Currently, pt has declined participation in virtual group therapy. [name] completed a session with patient on 08/17/18 and has an upcoming appointment with him on 08/26/18. They have been discussing coping skills to assist in the management of his mental health conditions. No additional concerns noted.

## 2018-08-24 NOTE — Telephone Encounter (Signed)
Thanks for the update

## 2018-08-26 DIAGNOSIS — F431 Post-traumatic stress disorder (PTSD): Secondary | ICD-10-CM | POA: Diagnosis not present

## 2018-09-03 DIAGNOSIS — F431 Post-traumatic stress disorder (PTSD): Secondary | ICD-10-CM | POA: Diagnosis not present

## 2018-09-10 DIAGNOSIS — F431 Post-traumatic stress disorder (PTSD): Secondary | ICD-10-CM | POA: Diagnosis not present

## 2018-09-18 ENCOUNTER — Encounter (HOSPITAL_COMMUNITY): Payer: Self-pay | Admitting: Emergency Medicine

## 2018-09-18 ENCOUNTER — Other Ambulatory Visit: Payer: Self-pay

## 2018-09-18 ENCOUNTER — Emergency Department (HOSPITAL_COMMUNITY): Payer: Medicare HMO

## 2018-09-18 ENCOUNTER — Emergency Department (HOSPITAL_COMMUNITY)
Admission: EM | Admit: 2018-09-18 | Discharge: 2018-09-18 | Disposition: A | Discharge status: Home / Self Care | Payer: Medicare HMO | Attending: Emergency Medicine | Admitting: Emergency Medicine

## 2018-09-18 DIAGNOSIS — M25551 Pain in right hip: Secondary | ICD-10-CM | POA: Diagnosis not present

## 2018-09-18 DIAGNOSIS — W19XXXA Unspecified fall, initial encounter: Secondary | ICD-10-CM

## 2018-09-18 DIAGNOSIS — M5489 Other dorsalgia: Secondary | ICD-10-CM | POA: Diagnosis not present

## 2018-09-18 DIAGNOSIS — F0390 Unspecified dementia without behavioral disturbance: Secondary | ICD-10-CM | POA: Diagnosis not present

## 2018-09-18 DIAGNOSIS — I129 Hypertensive chronic kidney disease with stage 1 through stage 4 chronic kidney disease, or unspecified chronic kidney disease: Secondary | ICD-10-CM | POA: Insufficient documentation

## 2018-09-18 DIAGNOSIS — S3993XA Unspecified injury of pelvis, initial encounter: Secondary | ICD-10-CM | POA: Diagnosis not present

## 2018-09-18 DIAGNOSIS — M2550 Pain in unspecified joint: Secondary | ICD-10-CM | POA: Diagnosis not present

## 2018-09-18 DIAGNOSIS — M545 Low back pain: Secondary | ICD-10-CM | POA: Diagnosis not present

## 2018-09-18 DIAGNOSIS — S3992XA Unspecified injury of lower back, initial encounter: Secondary | ICD-10-CM | POA: Diagnosis not present

## 2018-09-18 DIAGNOSIS — Z794 Long term (current) use of insulin: Secondary | ICD-10-CM | POA: Diagnosis not present

## 2018-09-18 DIAGNOSIS — S199XXA Unspecified injury of neck, initial encounter: Secondary | ICD-10-CM | POA: Diagnosis not present

## 2018-09-18 DIAGNOSIS — R0902 Hypoxemia: Secondary | ICD-10-CM | POA: Diagnosis not present

## 2018-09-18 DIAGNOSIS — Z79899 Other long term (current) drug therapy: Secondary | ICD-10-CM | POA: Insufficient documentation

## 2018-09-18 DIAGNOSIS — N183 Chronic kidney disease, stage 3 (moderate): Secondary | ICD-10-CM | POA: Diagnosis not present

## 2018-09-18 DIAGNOSIS — Z87891 Personal history of nicotine dependence: Secondary | ICD-10-CM | POA: Diagnosis not present

## 2018-09-18 DIAGNOSIS — M542 Cervicalgia: Secondary | ICD-10-CM | POA: Diagnosis not present

## 2018-09-18 DIAGNOSIS — E1122 Type 2 diabetes mellitus with diabetic chronic kidney disease: Secondary | ICD-10-CM | POA: Insufficient documentation

## 2018-09-18 DIAGNOSIS — S0990XA Unspecified injury of head, initial encounter: Secondary | ICD-10-CM | POA: Diagnosis not present

## 2018-09-18 DIAGNOSIS — M549 Dorsalgia, unspecified: Secondary | ICD-10-CM | POA: Diagnosis not present

## 2018-09-18 DIAGNOSIS — R52 Pain, unspecified: Secondary | ICD-10-CM | POA: Diagnosis not present

## 2018-09-18 LAB — CBC WITH DIFFERENTIAL/PLATELET
Abs Immature Granulocytes: 0.07 10*3/uL (ref 0.00–0.07)
Basophils Absolute: 0 10*3/uL (ref 0.0–0.1)
Basophils Relative: 1 %
Eosinophils Absolute: 0 10*3/uL (ref 0.0–0.5)
Eosinophils Relative: 0 %
HCT: 40.9 % (ref 39.0–52.0)
Hemoglobin: 14.4 g/dL (ref 13.0–17.0)
Immature Granulocytes: 1 %
Lymphocytes Relative: 30 %
Lymphs Abs: 1.9 10*3/uL (ref 0.7–4.0)
MCH: 31.8 pg (ref 26.0–34.0)
MCHC: 35.2 g/dL (ref 30.0–36.0)
MCV: 90.3 fL (ref 80.0–100.0)
Monocytes Absolute: 0.7 10*3/uL (ref 0.1–1.0)
Monocytes Relative: 11 %
Neutro Abs: 3.7 10*3/uL (ref 1.7–7.7)
Neutrophils Relative %: 57 %
Platelets: 106 10*3/uL — ABNORMAL LOW (ref 150–400)
RBC: 4.53 MIL/uL (ref 4.22–5.81)
RDW: 12 % (ref 11.5–15.5)
WBC: 6.5 10*3/uL (ref 4.0–10.5)
nRBC: 0 % (ref 0.0–0.2)

## 2018-09-18 LAB — BASIC METABOLIC PANEL
Anion gap: 9 (ref 5–15)
BUN: 27 mg/dL — ABNORMAL HIGH (ref 6–20)
CO2: 23 mmol/L (ref 22–32)
Calcium: 9.1 mg/dL (ref 8.9–10.3)
Chloride: 105 mmol/L (ref 98–111)
Creatinine, Ser: 1.7 mg/dL — ABNORMAL HIGH (ref 0.61–1.24)
GFR calc Af Amer: 53 mL/min — ABNORMAL LOW (ref >60)
GFR calc non Af Amer: 46 mL/min — ABNORMAL LOW (ref >60)
Glucose, Bld: 161 mg/dL — ABNORMAL HIGH (ref 70–99)
Potassium: 4.5 mmol/L (ref 3.5–5.1)
Sodium: 137 mmol/L (ref 135–145)

## 2018-09-18 LAB — HEMOGLOBIN A1C
Hgb A1c MFr Bld: 8 % — ABNORMAL HIGH (ref 4.8–5.6)
Mean Plasma Glucose: 182.9 mg/dL

## 2018-09-18 LAB — CK: Total CK: 236 U/L (ref 49–397)

## 2018-09-18 MED ORDER — MORPHINE SULFATE (PF) 4 MG/ML IV SOLN
4.0000 mg | Freq: Once | INTRAVENOUS | Status: AC
  Administered 2018-09-18: 4 mg via INTRAVENOUS
  Filled 2018-09-18: qty 1

## 2018-09-18 MED ORDER — SODIUM CHLORIDE 0.9 % IV BOLUS
1000.0000 mL | Freq: Once | INTRAVENOUS | Status: AC
  Administered 2018-09-18: 1000 mL via INTRAVENOUS

## 2018-09-18 MED ORDER — ONDANSETRON HCL 4 MG/2ML IJ SOLN
4.0000 mg | Freq: Once | INTRAMUSCULAR | Status: AC
  Administered 2018-09-18: 4 mg via INTRAVENOUS
  Filled 2018-09-18: qty 2

## 2018-09-18 NOTE — ED Triage Notes (Signed)
Also c/o neck pain.

## 2018-09-18 NOTE — Discharge Instructions (Signed)
You have been seen today after a fall . Please read and follow all provided instructions. Return to the emergency room for worsening condition or new concerning symptoms.    1. Medications:  Continue usual home medications.  This includes muscle relaxers that you have at home.  He can take Tylenol and ibuprofen for pain as needed. Take medications as prescribed. Please review all of the medicines and only take them if you do not have an allergy to them.   2. Treatment: rest, drink plenty of fluids. You can apply heat or ice to your back as needed if that helps with the pain.  3. Follow Up: Please follow up with your primary doctor in 2-5 days for discussion of your diagnoses and further evaluation after today's visit; Call today to arrange your follow up.    It is also a possibility that you have an allergic reaction to any of the medicines that you have been prescribed - Everybody reacts differently to medications and while MOST people have no trouble with most medicines, you may have a reaction such as nausea, vomiting, rash, swelling, shortness of breath. If this is the case, please stop taking the medicine immediately and contact your physician.  ?

## 2018-09-18 NOTE — ED Notes (Signed)
Patient transported to X-ray 

## 2018-09-18 NOTE — ED Triage Notes (Signed)
Fell in the bathroom around 0200 this morning. Was able to crawl back to bedroom but unable to get out of floor. No LOC. C/O pain in lower back that radiates around to right groin.

## 2018-09-18 NOTE — ED Notes (Signed)
Patient back from CT.

## 2018-09-18 NOTE — ED Provider Notes (Signed)
[facility] EMERGENCY DEPARTMENT Provider Note   CSN: [id number] Arrival date & time: 09/18/18  08/09/10    History   Chief Complaint Chief Complaint  Patient presents with   Fall    HPI [name] is a 53 y.o. male with medical history significant for CKD stage III, bipolar disorder presenting to emergency department today with chief complaint of fall.  Patient states he woke up during the night to use the bathroom and when walking back to bed the floor was slippery because it had been mopped earlier in the day causing him to slip and land on his right side.  He hit his head and neck on the door.   He was unable to get up on his own and laid on the floor for 6 hours.  Patient has pain in his right and left side of his neck, as well as right lower hip pain and right lower back.  His pain has been constant, he describes as aching. He did not take anything for pain prior to arrival.  He rates the pain as 8 out of 10 in severity.  Patient denies loss of consciousness, headache, fever, chills, visual changes.  History provided by patient with additional history obtained from chart review.     Past Medical History:  Diagnosis Date   ADD (attention deficit disorder)    Anxiety    Arthritis    right hip   Bipolar 1 disorder (HCC)    Blood in urine    CKD (chronic kidney disease), stage III ([place])    Creatinine elevation    Dementia ([place])    "early onset" (08/04/2017)   Depression    bipolar [facility]   Diabetes mellitus without complication ([place])    Family history of anesthesia complication    pt is unsure , but pt father may have been difficult to arouse    HCAP (healthcare-associated pneumonia) 10/31/2012   History of kidney stones    Hypertension    Hypogonadism male    Liver fatty degeneration    Microscopic hematuria    hereditary s/p Urology eval   Osteoarthritis of right hip 11/28/2011   2012 2015 s/p THR Severe  Dr Novella Olive      Pleural effusion 11/02/2012   Pneumonia 10-2012   Pneumonia, organism unspecified([place]) 11/02/2012   Polysubstance dependence, non-opioid, in remission ([place])    remote   Primary osteoarthritis of left hip 05/22/2015   PTSD (post-traumatic stress disorder)    SOCIAL ANXIETY DISORDER    Suicide attempt by multiple drug overdose 01/24/2016   Grieving his cat's death Aug 09, 2015    Patient Active Problem List   Diagnosis Date Noted   Closed displaced fracture of lateral malleolus of left fibula 08/02/2017   BPH (benign prostatic hyperplasia) 07/24/2017   Hemorrhoid 05/27/2017   Shoulder arthritis 05/27/2017   Diabetic neuropathy ([place]) 05/27/2017   Degenerative disc disease, lumbar 02/04/2017   Constipation    History of seizures 09/20/2016   Dementia, early onset with advanced brain atrophy for age 53/05/2016   Acute lower UTI    Urinary retention    Acute metabolic encephalopathy 23/30/0762   HLD (hyperlipidemia) 09/07/2016   GERD (gastroesophageal reflux disease) 09/07/2016   PTSD (post-traumatic stress disorder) 09/07/2016   Acute encephalopathy 09/07/2016   Fall 09/07/2016   Chronic kidney disease    Ataxia 10/14/2015   Hypokalemia 10/03/2015   Hemiparesis ([place])    Dyslipidemia 05/31/2014   Insomnia 11/11/2013   Degenerative joint  disease (DJD) of hip 08/16/2013   Erectile dysfunction 02/17/2011   Constipation - functional 02/17/2011   Diabetes type 2, uncontrolled ([place]) 11/06/2010   Hypogonadism male 05/20/2010   TOBACCO USER 05/20/2010   Demoralization and apathy 05/20/2010   Schizoaffective disorder, bipolar type ([place]) 01/18/2010   Right shoulder pain 01/18/2010   Anxiety state 12/05/2006   Essential hypertension 12/05/2006    Past Surgical History:  Procedure Laterality Date   BACK SURGERY     CLOSED REDUCTION METACARPAL WITH PERCUTANEOUS PINNING Right    LUMBAR [place] SURGERY     TONSILLECTOMY     TOTAL HIP ARTHROPLASTY Right  08/16/2013   Procedure: TOTAL HIP ARTHROPLASTY ANTERIOR APPROACH;  Surgeon: Hessie Dibble, MD;  Location: [place];  Service: Orthopedics;  Laterality: Right;   TOTAL HIP ARTHROPLASTY Left 05/22/2015   Procedure: TOTAL HIP ARTHROPLASTY ANTERIOR APPROACH;  Surgeon: Melrose Nakayama, MD;  Location: [place];  Service: Orthopedics;  Laterality: Left;        Home Medications    Prior to Admission medications   Medication Sig Start Date End Date Taking? Authorizing Provider  acetaminophen (TYLENOL) 325 MG tablet Take 2 tablets (650 mg total) by mouth every 6 (six) hours as needed for mild pain (or Fever >/= 101). Patient not taking: Reported on 07/20/2018 08/11/17   Elgergawy, Silver Huguenin, MD  ARIPiprazole (ABILIFY) 5 MG tablet TAKE 1 TABLET BY MOUTH DAILY FOR DEPRESSION Patient taking differently: Take 5 mg by mouth daily.  06/23/18   [name], MD  atorvastatin (LIPITOR) 10 MG tablet Take 1 tablet (10 mg total) by mouth daily. 08/10/18   [name], MD  bacitracin ointment Apply 1 application topically 3 (three) times daily. Apply to right fifth toe Patient not taking: Reported on 07/20/2018 08/11/17   Elgergawy, Silver Huguenin, MD  buPROPion (WELLBUTRIN XL) 300 MG 24 hr tablet Take 1 tablet (300 mg total) by mouth daily. 10/13/16   Elgergawy, Silver Huguenin, MD  cyanocobalamin 500 MCG tablet Take 1 tablet (500 mcg total) by mouth daily. Patient not taking: Reported on 07/20/2018 10/13/16   Elgergawy, Silver Huguenin, MD  diclofenac sodium (VOLTAREN) 1 % GEL Apply 4 g topically 4 (four) times daily. Patient not taking: Reported on 07/20/2018 02/04/17   [name], MD  divalproex (DEPAKOTE) 500 MG DR tablet Take 3 tablets (1,500 mg total) by mouth at bedtime. 10/13/16 07/20/18  Elgergawy, Silver Huguenin, MD  feeding supplement, ENSURE ENLIVE, (ENSURE ENLIVE) LIQD Take 237 mLs by mouth 2 (two) times daily between meals. 08/11/17   Elgergawy, Silver Huguenin, MD  fluticasone (FLONASE) 50 MCG/ACT nasal spray Place 2 sprays into both  nostrils daily. 07/20/18   [name], MD  gabapentin (NEURONTIN) 300 MG capsule Take 1 capsule (300 mg total) by mouth 3 (three) times daily. 08/10/18   [name], MD  glucose blood (ACCU-CHEK GUIDE) test strip CHECK SUGAR FOUR TIMES DAILY 07/22/18   [name], MD  insulin aspart (NOVOLOG FLEXPEN) 100 UNIT/ML FlexPen 0 to 12 units subcutaneously 3 times daily as per sliding scale 07/23/18   [name], MD  Insulin Detemir (LEVEMIR FLEXTOUCH) 100 UNIT/ML Pen Inject twice daily subcutaneously 35 units in the morning and 30 units in the evening 08/10/18   [name], MD  Insulin Pen Needle 31G X 5 MM MISC 1 each by Does not apply route 4 (four) times daily. 07/23/18   [name], MD  lidocaine (LIDODERM) 5 % Place 1 patch onto the skin daily. Remove & Discard patch  within 12 hours or as directed by MD Patient not taking: Reported on 07/20/2018 05/12/18   [name], MD  linagliptin (TRADJENTA) 5 MG TABS tablet Take 1 tablet (5 mg total) by mouth daily. 08/10/18   [name], MD  methocarbamol (ROBAXIN) 500 MG tablet Take 2 tablets (1,000 mg total) by mouth 4 (four) times daily. 08/10/18   [name], MD  omeprazole (PRILOSEC) 40 MG capsule Take 1 capsule (40 mg total) by mouth daily. Patient not taking: Reported on 08/10/2018 07/20/18   [name], MD  QUEtiapine (SEROQUEL) 400 MG tablet Take 1 tablet (400 mg total) by mouth at bedtime. 10/13/16   Elgergawy, Silver Huguenin, MD  tadalafil (CIALIS) 5 MG tablet TAKE 1 TABLET (5 MG TOTAL) BY MOUTH DAILY for BPH 08/10/18   [name], MD  Testosterone (ANDROGEL) 20.25 MG/1.25GM (1.62%) GEL Apply 20.25 mg topically every morning. Apply topically under arm pit alternating with each application. Patient not taking: Reported on 07/20/2018 05/12/18   [name], MD  traMADol (ULTRAM) 50 MG tablet Take 1 tablet (50 mg total) by mouth at bedtime. 08/10/18   [name], MD  traZODone (DESYREL) 150 MG tablet Take 1 tablet  (150 mg total) by mouth at bedtime. 10/13/16   Elgergawy, Silver Huguenin, MD  varenicline (CHANTIX CONTINUING MONTH PAK) 1 MG tablet Take 1 tablet (1 mg total) by mouth 2 (two) times daily. 08/10/18   [name], MD  varenicline (CHANTIX STARTING MONTH PAK) 0.5 MG X 11 & 1 MG X 42 tablet Take one 0.5 mg tablet by mouth once daily for 3 days, then increase to one 0.5 mg tablet twice daily for 4 days, then increase to one 1 mg tablet twice daily. 08/10/18   [name], MD    Family History Family History  Problem Relation Age of Onset   Diabetes Father    Cancer Mother        died of melanoma with mets   Cervical cancer Sister    Diabetes Sister    Other Neg Hx        hypogonadism    Social History Social History   Tobacco Use   Smoking status: Former Smoker    Years: 0.00    Types: Cigarettes    Last attempt to quit: 02/19/2014    Years since quitting: 4.5   Smokeless tobacco: Never Used  Substance Use Topics   Alcohol use: No   Drug use: No    Comment: hx of marijuana/cocaine/crack use but sober since 20's     Allergies   Vicodin [hydrocodone-acetaminophen]   Review of Systems Review of Systems  Constitutional: Negative for chills and fever.  HENT: Negative for congestion, rhinorrhea, sinus pressure and sore throat.   Eyes: Negative for pain and redness.  Respiratory: Negative for cough, shortness of breath and wheezing.   Cardiovascular: Negative for chest pain and palpitations.  Gastrointestinal: Negative for abdominal pain, constipation, diarrhea, nausea and vomiting.  Genitourinary: Negative for dysuria.  Musculoskeletal: Positive for arthralgias, back pain and neck pain. Negative for myalgias.  Skin: Negative for rash and wound.  Neurological: Negative for dizziness, syncope, weakness, numbness and headaches.  Psychiatric/Behavioral: Negative for confusion.     Physical Exam Updated Vital Signs BP 134/87    Pulse 74    Temp 97.7 F (36.5 C)  (Oral)    Resp 13    Ht 6' (1.829 m)    Wt 97.5 kg    SpO2 98%    BMI 29.16  kg/m   Physical Exam Vitals signs and nursing note reviewed.  Constitutional:      General: He is not in acute distress.    Appearance: He is not ill-appearing.     Interventions: Cervical collar in place.  HENT:     Head: Normocephalic.     Jaw: There is normal jaw occlusion. No tenderness or pain on movement.     Comments: No tenderness to palpation of skull. No deformities or crepitus noted. No open wounds, abrasions or lacerations.    Right Ear: Tympanic membrane and external ear normal. No hemotympanum.     Left Ear: Tympanic membrane and external ear normal. No hemotympanum.     Nose: Nose normal. No nasal tenderness.     Mouth/Throat:     Mouth: Mucous membranes are moist.     Pharynx: Oropharynx is clear.  Eyes:     General: No scleral icterus.       Right eye: No discharge.        Left eye: No discharge.     Extraocular Movements: Extraocular movements intact.     Conjunctiva/sclera: Conjunctivae normal.     Pupils: Pupils are equal, round, and reactive to light.  Neck:     Vascular: No JVD.     Comments: Unable to assess ROM as pt is in c collar. Midline tenderness. No bony stepoffs or deformities, Bilateral paraspinous muscle TTP. No bruising, erythema, or swelling.  Cardiovascular:     Rate and Rhythm: Normal rate and regular rhythm.     Pulses: Normal pulses.          Radial pulses are 2+ on the right side and 2+ on the left side.       Dorsalis pedis pulses are 2+ on the right side and 2+ on the left side.     Heart sounds: Normal heart sounds.  Pulmonary:     Comments: Lungs clear to auscultation in all fields. Symmetric chest rise. No wheezing, rales, or rhonchi. Chest:     Comments: No anterior chest wall tenderness.  No deformity or crepitus noted.   Abdominal:     Comments: Abdomen is soft, non-distended. Diffuse tenderness to palpation that radiates to his back. No rigidity, no  guarding. No peritoneal signs.  Musculoskeletal:     Right shoulder: Normal.     Left shoulder: Normal.     Right elbow: Normal.    Left elbow: Normal.     Right wrist: Normal.     Left wrist: Normal.     Right hip: He exhibits decreased range of motion and tenderness. He exhibits no swelling and no deformity.     Left hip: Normal.     Right knee: Normal.     Left knee: Normal.     Right ankle: Normal.     Left ankle: Normal.       Back:       Legs:     Comments: Tenderness to palpation of right paraspinal muscles as pictured. Midline tenderness of lumbar spine.  Skin:    General: Skin is warm and dry.     Capillary Refill: Capillary refill takes less than 2 seconds.  Neurological:     Mental Status: He is oriented to person, place, and time.     GCS: GCS eye subscore is 4. GCS verbal subscore is 5. GCS motor subscore is 6.     Comments: Fluent speech, no facial droop.  Psychiatric:  Behavior: Behavior normal.      ED Treatments / Results  Labs (all labs ordered are listed, but only abnormal results are displayed) Labs Reviewed  BASIC METABOLIC PANEL - Abnormal; Notable for the following components:      Result Value   Glucose, Bld 161 (*)    BUN 27 (*)    Creatinine, Ser 1.70 (*)    GFR calc non Af Amer 46 (*)    GFR calc Af Amer 53 (*)    All other components within normal limits  CBC WITH DIFFERENTIAL/PLATELET - Abnormal; Notable for the following components:   Platelets 106 (*)    All other components within normal limits  HEMOGLOBIN A1C - Abnormal; Notable for the following components:   Hgb A1c MFr Bld 8.0 (*)    All other components within normal limits  CK    EKG None  Radiology Dg Lumbar Spine Complete  Result Date: 09/18/2018 CLINICAL DATA:  Fall, back pain, previous lower lumbar fusion EXAM: LUMBAR SPINE - COMPLETE 4+ VIEW COMPARISON:  08/05/2016, 04/15/2016 FINDINGS: Previous posterior fusion at L4-5. Lower thoracic and lumbar multilevel  endplate osteophytes noted. Stable alignment. Preserved vertebral body heights. No acute compression fracture, wedge-shaped deformity or focal kyphosis. No gross malalignment. Curvature of the spine on the frontal view may be positional. Nonobstructive bowel gas pattern. Aortoiliac atherosclerosis present. IMPRESSION: Previous lower lumbar fusion and degenerative changes as before. No acute finding by plain radiography Aortoiliac atherosclerosis Electronically Signed   By: Jerilynn Mages.  Shick M.D.   On: 09/18/2018 12:40   Dg Pelvis 1-2 Views  Result Date: 09/18/2018 CLINICAL DATA:  Fall, back pain EXAM: PELVIS - 1-2 VIEW COMPARISON:  01/12/2012, 09/18/2018 FINDINGS: Remote L4-5 posterior fusion and bilateral hip arthroplasties. No gross malalignment in the frontal plane. Bony pelvis intact. Normal appearing SI joints. No diastasis. Nonobstructive bowel gas pattern. IMPRESSION: Postop changes as above.  No acute finding by plain radiography. Electronically Signed   By: Jerilynn Mages.  Shick M.D.   On: 09/18/2018 12:38   Ct Head Wo Contrast  Result Date: 09/18/2018 CLINICAL DATA:  [place] in bathroom. EXAM: CT HEAD WITHOUT CONTRAST CT CERVICAL SPINE WITHOUT CONTRAST TECHNIQUE: Multidetector CT imaging of the head and cervical spine was performed following the standard protocol without intravenous contrast. Multiplanar CT image reconstructions of the cervical spine were also generated. COMPARISON:  None. FINDINGS: CT HEAD FINDINGS Brain: No acute intracranial hemorrhage. No focal mass lesion. No CT evidence of acute infarction. No midline shift or mass effect. No hydrocephalus. Basilar cisterns are patent. Vascular: No hyperdense vessel or unexpected calcification. Eccentric calcification of the basilar artery. Skull: Normal. Negative for fracture or focal lesion. Sinuses/Orbits: Paranasal sinuses and mastoid air cells are clear. Orbits are clear. Other: None. CT CERVICAL SPINE FINDINGS Alignment: Normal alignment of the cervical  vertebral bodies. Skull base and vertebrae: Normal craniocervical junction. No loss of vertebral body height or disc height. Normal facet articulation. No evidence of fracture. Soft tissues and spinal canal: No prevertebral soft tissue swelling. No perispinal or epidural hematoma. Disc levels: Bulky osteophytes anteriorly and posteriorly from C4-C7. Widening of the disc space at C6-C7 is unchanged from prior. Upper chest: Clear Other: A stable thyroid nodules. IMPRESSION: 1. No intracranial trauma. 2. No cervical spine fracture. 3. Multilevel disc osteophytic disease with anterior and posterior bulky osteophytes from C4-C6. 4. Disc space widening at C6-C7 is chronic and unchanged from 08/02/2017. Electronically Signed   By: Suzy Bouchard M.D.   On: 09/18/2018 13:02  Ct Cervical Spine Wo Contrast  Result Date: 09/18/2018 CLINICAL DATA:  [place] in bathroom. EXAM: CT HEAD WITHOUT CONTRAST CT CERVICAL SPINE WITHOUT CONTRAST TECHNIQUE: Multidetector CT imaging of the head and cervical spine was performed following the standard protocol without intravenous contrast. Multiplanar CT image reconstructions of the cervical spine were also generated. COMPARISON:  None. FINDINGS: CT HEAD FINDINGS Brain: No acute intracranial hemorrhage. No focal mass lesion. No CT evidence of acute infarction. No midline shift or mass effect. No hydrocephalus. Basilar cisterns are patent. Vascular: No hyperdense vessel or unexpected calcification. Eccentric calcification of the basilar artery. Skull: Normal. Negative for fracture or focal lesion. Sinuses/Orbits: Paranasal sinuses and mastoid air cells are clear. Orbits are clear. Other: None. CT CERVICAL SPINE FINDINGS Alignment: Normal alignment of the cervical vertebral bodies. Skull base and vertebrae: Normal craniocervical junction. No loss of vertebral body height or disc height. Normal facet articulation. No evidence of fracture. Soft tissues and spinal canal: No prevertebral soft  tissue swelling. No perispinal or epidural hematoma. Disc levels: Bulky osteophytes anteriorly and posteriorly from C4-C7. Widening of the disc space at C6-C7 is unchanged from prior. Upper chest: Clear Other: A stable thyroid nodules. IMPRESSION: 1. No intracranial trauma. 2. No cervical spine fracture. 3. Multilevel disc osteophytic disease with anterior and posterior bulky osteophytes from C4-C6. 4. Disc space widening at C6-C7 is chronic and unchanged from 08/02/2017. Electronically Signed   By: Suzy Bouchard M.D.   On: 09/18/2018 13:02    Procedures Procedures (including critical care time)  Medications Ordered in ED Medications  sodium chloride 0.9 % bolus 1,000 mL (1,000 mLs Intravenous New Bag/Given 09/18/18 1151)  morphine 4 MG/ML injection 4 mg (4 mg Intravenous Given 09/18/18 1151)  ondansetron (ZOFRAN) injection 4 mg (4 mg Intravenous Given 09/18/18 1152)  morphine 4 MG/ML injection 4 mg (4 mg Intravenous Given 09/18/18 1339)     Initial Impression / Assessment and Plan / ED Course  I have reviewed the triage vital signs and the nursing notes.  Pertinent labs & imaging results that were available during my care of the patient were reviewed by me and considered in my medical decision making (see chart for details).   53 yo male presents after mechanical fall. He appears uncomfortable on arrival, however is in no acute distress. He was on the ground for 6 hours unable to get up, CK within normal range, doubt rhabdomyolysis.   Drew A1C today, per pcp note they are wanting to collect however have been unable to see him in office given current pandemic. A1C is 8.0. CBC and BMP overall unremarkable. Ct head and neck viewed by with without signs of bleeding or fracture. Xrays of pelvis and lumbar spine also viewed by me shows no acute injury, hardware from bilateral hip arthoplasties are stable. Pt reports pain improved after IV morphine. Myself and RN assisted the pt to ambulate and he  did so without difficulty. He uses a walker at home. Pt is already taking Robaxin at home for back spasms, will recommend tylenol and ibuprofen for pain as needed at home. Patient is hemodynamically stable, in NAD. Evaluation does not show pathology that would require ongoing emergent intervention or inpatient treatment. I explained the diagnosis to the patient. Patient is comfortable with above plan and is stable for discharge at this time. All questions were answered prior to disposition. Strict return precautions for returning to the ED were discussed. Encouraged follow up with PCP.   Final Clinical Impressions(s) / ED Diagnoses  Final diagnoses:  Fall, initial encounter    ED Discharge Orders    None       Flint Melter 09/18/18 2140    Davonna Belling, MD 09/21/18 [phone]

## 2018-09-27 ENCOUNTER — Telehealth: Payer: Self-pay | Admitting: Family Medicine

## 2018-09-27 NOTE — Telephone Encounter (Signed)
Can we reopen his referral that was put in back in April

## 2018-09-27 NOTE — Telephone Encounter (Signed)
New Message   Pt states he needs an appt for family planning and for it to be back dated from February. Please f/u

## 2018-09-28 ENCOUNTER — Other Ambulatory Visit: Payer: Self-pay | Admitting: Family Medicine

## 2018-09-28 NOTE — Telephone Encounter (Signed)
Good Morning  Don't matter if is closed the referral is external  . I will faxed again  To [facility]

## 2018-10-05 DIAGNOSIS — F431 Post-traumatic stress disorder (PTSD): Secondary | ICD-10-CM | POA: Diagnosis not present

## 2018-10-11 ENCOUNTER — Other Ambulatory Visit: Payer: Self-pay | Admitting: Pharmacist

## 2018-10-11 DIAGNOSIS — E11649 Type 2 diabetes mellitus with hypoglycemia without coma: Secondary | ICD-10-CM

## 2018-10-11 MED ORDER — LEVEMIR FLEXTOUCH 100 UNIT/ML ~~LOC~~ SOPN: PEN_INJECTOR | SUBCUTANEOUS | 2 refills | Status: DC

## 2018-10-14 DIAGNOSIS — F431 Post-traumatic stress disorder (PTSD): Secondary | ICD-10-CM | POA: Diagnosis not present

## 2018-10-20 DIAGNOSIS — F4310 Post-traumatic stress disorder, unspecified: Secondary | ICD-10-CM | POA: Diagnosis not present

## 2018-10-25 ENCOUNTER — Other Ambulatory Visit: Payer: Self-pay | Admitting: Family Medicine

## 2018-10-25 DIAGNOSIS — J302 Other seasonal allergic rhinitis: Secondary | ICD-10-CM

## 2018-10-28 DIAGNOSIS — F4310 Post-traumatic stress disorder, unspecified: Secondary | ICD-10-CM | POA: Diagnosis not present

## 2018-11-04 DIAGNOSIS — F4310 Post-traumatic stress disorder, unspecified: Secondary | ICD-10-CM | POA: Diagnosis not present

## 2018-11-17 ENCOUNTER — Telehealth: Payer: Self-pay | Admitting: Family Medicine

## 2018-11-17 ENCOUNTER — Ambulatory Visit: Payer: Medicare HMO | Attending: Family Medicine | Admitting: Family Medicine

## 2018-11-17 ENCOUNTER — Other Ambulatory Visit: Payer: Self-pay

## 2018-11-17 DIAGNOSIS — E11649 Type 2 diabetes mellitus with hypoglycemia without coma: Secondary | ICD-10-CM

## 2018-11-17 DIAGNOSIS — N401 Enlarged prostate with lower urinary tract symptoms: Secondary | ICD-10-CM

## 2018-11-17 DIAGNOSIS — D229 Melanocytic nevi, unspecified: Secondary | ICD-10-CM | POA: Diagnosis not present

## 2018-11-17 DIAGNOSIS — M5136 Other intervertebral disc degeneration, lumbar region: Secondary | ICD-10-CM | POA: Diagnosis not present

## 2018-11-17 DIAGNOSIS — R338 Other retention of urine: Secondary | ICD-10-CM | POA: Diagnosis not present

## 2018-11-17 MED ORDER — GABAPENTIN 300 MG PO CAPS: 300.0000 mg | ORAL_CAPSULE | Freq: Three times a day (TID) | ORAL | 6 refills | Status: DC

## 2018-11-17 MED ORDER — LINAGLIPTIN 5 MG PO TABS: 5.0000 mg | ORAL_TABLET | Freq: Every day | ORAL | 6 refills | Status: DC

## 2018-11-17 MED ORDER — METHOCARBAMOL 500 MG PO TABS: 1000.0000 mg | ORAL_TABLET | Freq: Four times a day (QID) | ORAL | 6 refills | Status: DC

## 2018-11-17 MED ORDER — TRAMADOL HCL 50 MG PO TABS: 50.0000 mg | ORAL_TABLET | Freq: Every day | ORAL | 2 refills | Status: DC

## 2018-11-17 MED ORDER — ATORVASTATIN CALCIUM 10 MG PO TABS: ORAL_TABLET | ORAL | 6 refills | Status: DC

## 2018-11-17 MED ORDER — LEVEMIR FLEXTOUCH 100 UNIT/ML ~~LOC~~ SOPN: PEN_INJECTOR | SUBCUTANEOUS | 6 refills | Status: DC

## 2018-11-17 NOTE — Telephone Encounter (Signed)
Pt called to get his refill on -tadalafil (CIALIS) 5 MG tablet  To  [email or web address] -[id number]

## 2018-11-17 NOTE — Progress Notes (Signed)
Patient has been called and DOB has been verified. Patient has been screened and transferred to PCP to start phone visit.  Patient is having pain in lower back.

## 2018-11-17 NOTE — Progress Notes (Signed)
Virtual Visit via Telephone Note  I connected with [name], on 11/17/2018 at 9:11 AM by telephone due to the COVID-19 pandemic and verified that I am speaking with the correct person using two identifiers.   Consent: I discussed the limitations, risks, security and privacy concerns of performing an evaluation and management service by telephone and the availability of in person appointments. I also discussed with the patient that there may be a patient responsible charge related to this service. The patient expressed understanding and agreed to proceed.   Location of Patient: Home   Location of Provider: Clinic   Persons participating in Telemedicine visit: Zimir Kittleson Farrington-CMA Dr. 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 He is requesting a referral to dermatology as he has multiple lesions on his skin which he would like evaluated. His fasting sugars have been around 150 and he denies hypoglycemia or visual concerns. He does have chronic low back pain and fell 3 weeks ago which exacerbated his pain.  Pain is worse in the morning but improves after he takes tramadol. He is managed by psychiatry where he receives his psychotropic medications. For BPH symptoms he is on Cialis and is wondering if the dose can be increased.  He would also like to use the mail order pharmacy but will be getting back with [place] with regards to where this needs to be sent.   Past Medical History:  Diagnosis Date  . ADD (attention deficit disorder)   . Anxiety   . Arthritis    right hip  . Bipolar 1 disorder ([place])   . Blood in urine   . CKD (chronic kidney disease), stage III ([place])   . Creatinine elevation   . Dementia ([place])    "early  onset" (08/04/2017)  . Depression    bipolar [facility]  . Diabetes mellitus without complication ([place])   . Family history of anesthesia complication    pt is unsure , but pt father may have been difficult to arouse   . HCAP (healthcare-associated pneumonia) 10/31/2012  . History of kidney stones   . Hypertension   . Hypogonadism male   . Liver fatty degeneration   . Microscopic hematuria    hereditary s/p Urology eval  . Osteoarthritis of right hip 11/28/2011   2012 2015 s/p THR Severe  Dr Novella Olive    . Pleural effusion 11/02/2012  . Pneumonia 10-2012  . Pneumonia, organism unspecified([place]) 11/02/2012  . Polysubstance dependence, non-opioid, in remission ([place])    remote  . Primary osteoarthritis of left hip 05/22/2015  . PTSD (post-traumatic stress disorder)    SOCIAL ANXIETY DISORDER   . Suicide attempt by multiple drug overdose 01-06-2016   Grieving his cat's death 2015-07-22   Allergies  Allergen Reactions  . Vicodin [Hydrocodone-Acetaminophen] Itching    Current Outpatient Medications on File Prior to Visit  Medication Sig Dispense Refill  . ARIPiprazole (ABILIFY) 5 MG tablet TAKE 1 TABLET BY MOUTH DAILY FOR DEPRESSION (Patient taking differently: Take 5 mg by mouth daily. ) 30 tablet 0  . atorvastatin (LIPITOR) 10 MG tablet TAKE 1 TABLET(10 MG) BY MOUTH DAILY 30 tablet 2  . buPROPion (WELLBUTRIN XL) 300 MG 24 hr tablet Take 1 tablet (300 mg total) by mouth daily.  30 tablet 0  . feeding supplement, ENSURE ENLIVE, (ENSURE ENLIVE) LIQD Take 237 mLs by mouth 2 (two) times daily between meals. 237 mL 12  . fluticasone (FLONASE) 50 MCG/ACT nasal spray SHAKE LIQUID AND USE 2 SPRAYS IN EACH NOSTRIL DAILY 16 g 2  . gabapentin (NEURONTIN) 300 MG capsule Take 1 capsule (300 mg total) by mouth 3 (three) times daily. 60 capsule 3  . glucose blood (ACCU-CHEK GUIDE) test strip CHECK SUGAR FOUR TIMES DAILY 100 each 12  . insulin aspart (NOVOLOG FLEXPEN) 100 UNIT/ML FlexPen 0 to 12 units  subcutaneously 3 times daily as per sliding scale 15 mL 11  . Insulin Detemir (LEVEMIR FLEXTOUCH) 100 UNIT/ML Pen Inject twice daily subcutaneously 35 units in the morning and 35 units in the evening 15 mL 2  . Insulin Pen Needle 31G X 5 MM MISC 1 each by Does not apply route 4 (four) times daily. 120 each 5  . linagliptin (TRADJENTA) 5 MG TABS tablet Take 1 tablet (5 mg total) by mouth daily. 30 tablet 3  . methocarbamol (ROBAXIN) 500 MG tablet Take 2 tablets (1,000 mg total) by mouth 4 (four) times daily. 120 tablet 3  . QUEtiapine (SEROQUEL) 400 MG tablet Take 1 tablet (400 mg total) by mouth at bedtime. 30 tablet 11  . tadalafil (CIALIS) 5 MG tablet TAKE 1 TABLET (5 MG TOTAL) BY MOUTH DAILY for BPH 30 tablet 3  . traMADol (ULTRAM) 50 MG tablet Take 1 tablet (50 mg total) by mouth at bedtime. 30 tablet 2  . traZODone (DESYREL) 150 MG tablet Take 1 tablet (150 mg total) by mouth at bedtime. 30 tablet 1  . acetaminophen (TYLENOL) 325 MG tablet Take 2 tablets (650 mg total) by mouth every 6 (six) hours as needed for mild pain (or Fever >/= 101). (Patient not taking: Reported on 07/20/2018)    . bacitracin ointment Apply 1 application topically 3 (three) times daily. Apply to right fifth toe (Patient not taking: Reported on 07/20/2018) 120 g 0  . cyanocobalamin 500 MCG tablet Take 1 tablet (500 mcg total) by mouth daily. (Patient not taking: Reported on 07/20/2018) 30 tablet 0  . diclofenac sodium (VOLTAREN) 1 % GEL Apply 4 g topically 4 (four) times daily. (Patient not taking: Reported on 07/20/2018) 100 g 1  . divalproex (DEPAKOTE) 500 MG DR tablet Take 3 tablets (1,500 mg total) by mouth at bedtime. 90 tablet 0  . lidocaine (LIDODERM) 5 % Place 1 patch onto the skin daily. Remove & Discard patch within 12 hours or as directed by MD (Patient not taking: Reported on 07/20/2018) 30 patch 0  . omeprazole (PRILOSEC) 40 MG capsule Take 1 capsule (40 mg total) by mouth daily. (Patient not taking: Reported on  08/10/2018) 30 capsule 3  . Testosterone (ANDROGEL) 20.25 MG/1.25GM (1.62%) GEL Apply 20.25 mg topically every morning. Apply topically under arm pit alternating with each application. (Patient not taking: Reported on 07/20/2018) 1.25 g 3  . varenicline (CHANTIX CONTINUING MONTH PAK) 1 MG tablet Take 1 tablet (1 mg total) by mouth 2 (two) times daily. (Patient not taking: Reported on 11/17/2018) 60 tablet 2  . varenicline (CHANTIX STARTING MONTH PAK) 0.5 MG X 11 & 1 MG X 42 tablet Take one 0.5 mg tablet by mouth once daily for 3 days, then increase to one 0.5 mg tablet twice daily for 4 days, then increase to one 1 mg tablet twice daily. (Patient not taking: Reported on 11/17/2018) 53 tablet 0  No current facility-administered medications on file prior to visit.     Observations/Objective: Awake, alert, oriented x3 Not in acute distress   CMP Latest Ref Rng & Units 09/18/2018 07/20/2018 07/20/2018  Glucose 70 - 99 mg/dL 161(H) - 588(HH)  BUN 6 - 20 mg/dL 27(H) - 29(H)  Creatinine 0.61 - 1.24 mg/dL 1.70(H) - 1.78(H)  Sodium 135 - 145 mmol/L 137 125(L) 124(L)  Potassium 3.5 - 5.1 mmol/L 4.5 4.8 4.6  Chloride 98 - 111 mmol/L 105 - 93(L)  CO2 22 - 32 mmol/L 23 - 20(L)  Calcium 8.9 - 10.3 mg/dL 9.1 - 8.9  Total Protein 6.0 - 8.5 g/dL - - -  Total Bilirubin 0.0 - 1.2 mg/dL - - -  Alkaline Phos 39 - 117 IU/L - - -  AST 0 - 40 IU/L - - -  ALT 0 - 44 IU/L - - -    Lab Results  Component Value Date   HGBA1C 8.0 (H) 09/18/2018     Assessment and Plan: 1. Uncontrolled type 2 diabetes mellitus with hypoglycemia, unspecified hypoglycemia coma status ([place]) Uncontrolled with A1c of 8.0 We will have him come in for labs Counseled on Diabetic diet, my plate method, 536 minutes of moderate intensity exercise/week Keep blood sugar logs with fasting goals of 80-120 mg/dl, random of less than 180 and in the event of sugars less than 60 mg/dl or greater than 400 mg/dl please notify the clinic ASAP. It is  recommended that you undergo annual eye exams and annual foot exams. Pneumonia vaccine is recommended.  - Insulin Detemir (LEVEMIR FLEXTOUCH) 100 UNIT/ML Pen; Inject twice daily subcutaneously 38units in the morning and 38 units in the evening  Dispense: 15 mL; Refill: 6 - atorvastatin (LIPITOR) 10 MG tablet; TAKE 1 TABLET(10 MG) BY MOUTH DAILY  Dispense: 30 tablet; Refill: 6 - linagliptin (TRADJENTA) 5 MG TABS tablet; Take 1 tablet (5 mg total) by mouth daily.  Dispense: 30 tablet; Refill: 6  2. Benign prostatic hyperplasia with urinary retention He will be getting in touch with the clinic with regards to where his Cialis prescription needs to be sent As per patient he tried Flomax in the past with no success - Ambulatory referral to Urology  3. Degenerative disc disease, lumbar Controlled on tramadol - methocarbamol (ROBAXIN) 500 MG tablet; Take 2 tablets (1,000 mg total) by mouth 4 (four) times daily.  Dispense: 120 tablet; Refill: 6 - gabapentin (NEURONTIN) 300 MG capsule; Take 1 capsule (300 mg total) by mouth 3 (three) times daily.  Dispense: 60 capsule; Refill: 6 - traMADol (ULTRAM) 50 MG tablet; Take 1 tablet (50 mg total) by mouth at bedtime.  Dispense: 30 tablet; Refill: 2  4. Numerous moles - Ambulatory referral to Dermatology   Follow Up Instructions: 3 months   I discussed the assessment and treatment plan with the patient. The patient was provided an opportunity to ask questions and all were answered. The patient agreed with the plan and demonstrated an understanding of the instructions.   The patient was advised to call back or seek an in-person evaluation if the symptoms worsen or if the condition fails to improve as anticipated.     I provided 16 minutes total of non-face-to-face time during this encounter including median intraservice time, reviewing previous notes, labs, imaging, medications, management and patient verbalized understanding.     [name],  MD, FAAFP. [facility] [place], [place]   11/17/2018, 9:11 AM

## 2018-11-19 NOTE — Telephone Encounter (Signed)
[name], can you please send of this prescription to [email or web address]', as per patient request?  Thank you

## 2018-11-22 ENCOUNTER — Other Ambulatory Visit: Payer: Self-pay | Admitting: Family Medicine

## 2018-11-22 DIAGNOSIS — N401 Enlarged prostate with lower urinary tract symptoms: Secondary | ICD-10-CM

## 2018-11-24 ENCOUNTER — Other Ambulatory Visit: Payer: Self-pay

## 2018-11-24 DIAGNOSIS — N401 Enlarged prostate with lower urinary tract symptoms: Secondary | ICD-10-CM

## 2018-11-24 MED ORDER — TADALAFIL 5 MG PO TABS: ORAL_TABLET | ORAL | 2 refills | Status: DC

## 2018-11-24 NOTE — Telephone Encounter (Signed)
Medication has been sent.  

## 2018-11-26 ENCOUNTER — Other Ambulatory Visit: Payer: Self-pay | Admitting: Family Medicine

## 2018-11-26 DIAGNOSIS — K219 Gastro-esophageal reflux disease without esophagitis: Secondary | ICD-10-CM

## 2018-11-30 DIAGNOSIS — F431 Post-traumatic stress disorder (PTSD): Secondary | ICD-10-CM | POA: Diagnosis not present

## 2018-12-09 DIAGNOSIS — F4310 Post-traumatic stress disorder, unspecified: Secondary | ICD-10-CM | POA: Diagnosis not present

## 2018-12-13 DIAGNOSIS — F4310 Post-traumatic stress disorder, unspecified: Secondary | ICD-10-CM | POA: Diagnosis not present

## 2018-12-28 DIAGNOSIS — F4310 Post-traumatic stress disorder, unspecified: Secondary | ICD-10-CM | POA: Diagnosis not present

## 2018-12-31 DIAGNOSIS — D489 Neoplasm of uncertain behavior, unspecified: Secondary | ICD-10-CM | POA: Diagnosis not present

## 2018-12-31 DIAGNOSIS — L821 Other seborrheic keratosis: Secondary | ICD-10-CM | POA: Diagnosis not present

## 2018-12-31 DIAGNOSIS — L219 Seborrheic dermatitis, unspecified: Secondary | ICD-10-CM | POA: Diagnosis not present

## 2018-12-31 DIAGNOSIS — L814 Other melanin hyperpigmentation: Secondary | ICD-10-CM | POA: Diagnosis not present

## 2019-01-11 DIAGNOSIS — F4310 Post-traumatic stress disorder, unspecified: Secondary | ICD-10-CM | POA: Diagnosis not present

## 2019-01-18 DIAGNOSIS — F4310 Post-traumatic stress disorder, unspecified: Secondary | ICD-10-CM | POA: Diagnosis not present

## 2019-01-24 ENCOUNTER — Other Ambulatory Visit: Payer: Self-pay | Admitting: Family Medicine

## 2019-01-24 DIAGNOSIS — J302 Other seasonal allergic rhinitis: Secondary | ICD-10-CM

## 2019-01-25 DIAGNOSIS — F4310 Post-traumatic stress disorder, unspecified: Secondary | ICD-10-CM | POA: Diagnosis not present

## 2019-01-27 DIAGNOSIS — Z7189 Other specified counseling: Secondary | ICD-10-CM | POA: Diagnosis not present

## 2019-01-27 DIAGNOSIS — D489 Neoplasm of uncertain behavior, unspecified: Secondary | ICD-10-CM | POA: Diagnosis not present

## 2019-01-27 DIAGNOSIS — D485 Neoplasm of uncertain behavior of skin: Secondary | ICD-10-CM | POA: Diagnosis not present

## 2019-02-01 DIAGNOSIS — D229 Melanocytic nevi, unspecified: Secondary | ICD-10-CM | POA: Diagnosis not present

## 2019-02-09 DIAGNOSIS — F4310 Post-traumatic stress disorder, unspecified: Secondary | ICD-10-CM | POA: Diagnosis not present

## 2019-02-17 DIAGNOSIS — F4310 Post-traumatic stress disorder, unspecified: Secondary | ICD-10-CM | POA: Diagnosis not present

## 2019-02-21 ENCOUNTER — Ambulatory Visit: Payer: Medicare HMO | Attending: Family Medicine | Admitting: Family Medicine

## 2019-02-21 ENCOUNTER — Encounter: Payer: Self-pay | Admitting: Family Medicine

## 2019-02-21 ENCOUNTER — Other Ambulatory Visit: Payer: Self-pay

## 2019-02-21 VITALS — BP 147/100 | HR 70 | Temp 98.0°F | Ht 77.0 in | Wt 227.0 lb

## 2019-02-21 DIAGNOSIS — R338 Other retention of urine: Secondary | ICD-10-CM

## 2019-02-21 DIAGNOSIS — F250 Schizoaffective disorder, bipolar type: Secondary | ICD-10-CM | POA: Diagnosis not present

## 2019-02-21 DIAGNOSIS — E1121 Type 2 diabetes mellitus with diabetic nephropathy: Secondary | ICD-10-CM

## 2019-02-21 DIAGNOSIS — E1122 Type 2 diabetes mellitus with diabetic chronic kidney disease: Secondary | ICD-10-CM | POA: Diagnosis not present

## 2019-02-21 DIAGNOSIS — N401 Enlarged prostate with lower urinary tract symptoms: Secondary | ICD-10-CM

## 2019-02-21 DIAGNOSIS — M5136 Other intervertebral disc degeneration, lumbar region: Secondary | ICD-10-CM

## 2019-02-21 DIAGNOSIS — I10 Essential (primary) hypertension: Secondary | ICD-10-CM | POA: Diagnosis not present

## 2019-02-21 DIAGNOSIS — Z794 Long term (current) use of insulin: Secondary | ICD-10-CM

## 2019-02-21 DIAGNOSIS — E291 Testicular hypofunction: Secondary | ICD-10-CM | POA: Diagnosis not present

## 2019-02-21 DIAGNOSIS — N1831 Chronic kidney disease, stage 3a: Secondary | ICD-10-CM

## 2019-02-21 LAB — GLUCOSE, POCT (MANUAL RESULT ENTRY): POC Glucose: 183 mg/dl — AB (ref 70–99)

## 2019-02-21 LAB — POCT GLYCOSYLATED HEMOGLOBIN (HGB A1C): HbA1c, POC (controlled diabetic range): 7 % (ref 0.0–7.0)

## 2019-02-21 MED ORDER — TESTOSTERONE 20.25 MG/1.25GM (1.62%) TD GEL: 20.2500 mg | Freq: Every morning | TRANSDERMAL | 3 refills | Status: DC

## 2019-02-21 MED ORDER — ATORVASTATIN CALCIUM 10 MG PO TABS: ORAL_TABLET | ORAL | 6 refills | Status: DC

## 2019-02-21 MED ORDER — TADALAFIL 5 MG PO TABS: ORAL_TABLET | ORAL | 2 refills | Status: DC

## 2019-02-21 MED ORDER — METHOCARBAMOL 500 MG PO TABS: 1000.0000 mg | ORAL_TABLET | Freq: Four times a day (QID) | ORAL | 6 refills | Status: DC

## 2019-02-21 MED ORDER — LISINOPRIL-HYDROCHLOROTHIAZIDE 10-12.5 MG PO TABS: 1.0000 | ORAL_TABLET | Freq: Every day | ORAL | 6 refills | Status: DC

## 2019-02-21 MED ORDER — LEVEMIR FLEXTOUCH 100 UNIT/ML ~~LOC~~ SOPN: PEN_INJECTOR | SUBCUTANEOUS | 6 refills | Status: DC

## 2019-02-21 MED ORDER — LINAGLIPTIN 5 MG PO TABS: 5.0000 mg | ORAL_TABLET | Freq: Every day | ORAL | 6 refills | Status: DC

## 2019-02-21 MED ORDER — GABAPENTIN 300 MG PO CAPS: 300.0000 mg | ORAL_CAPSULE | Freq: Three times a day (TID) | ORAL | 6 refills | Status: DC

## 2019-02-21 MED ORDER — TRAMADOL HCL 50 MG PO TABS: 50.0000 mg | ORAL_TABLET | Freq: Every day | ORAL | 2 refills | Status: DC

## 2019-02-21 NOTE — Patient Instructions (Addendum)
[facility] ph. # [phone].Address [place] [address].

## 2019-02-21 NOTE — Progress Notes (Signed)
Patient states that his bones hurt.

## 2019-02-21 NOTE — Progress Notes (Signed)
Established Patient Office Visit  Subjective:  Patient ID: [name], male    DOB: 1965/12/11  Age: 53 y.o. MRN: [id number]  CC:  Chief Complaint  Patient presents with  . 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  The patient checks his blood sugars regularly at home and states that they are normally 200-300 and he endorses compliance with his medications.  He does admit that he drinks several sugary drinks daily and avoids water intake.  He is non compliant with a diabetic diet.    His blood pressure is elevated today which is consistent with previous visits.  He does not check it at home.  He is not currently on a antihypertensive.  Patient denies heartburn, nausea, vomiting.  He is complaining today of urinary frequency which leads to voiding accidents as well as some difficulty initiating his urinary stream.  He is currently on Cialis and states that Flomax did not work for him.  He has not seen urology. The patient is being followed by [facility] services of the [place] for his mental health.  He expresses some difficulty obtaining medication refills but stated that he will call to make an appointment with them to get his refills.  Today the patient is having pain in his lumbar spine as well as his shoulder joints that he rates 7/10.  The pain is described as an aching and is worse when he wakes in the morning and improves with movement and tramadol.  Does not radiate.  He has used CBD oil which also helps his pain so he can sleep.    Past Medical History:  Diagnosis Date  . ADD (attention deficit disorder)   . Anxiety   . Arthritis    right hip  . Bipolar 1 disorder ([place])   . Blood in urine   . CKD (chronic kidney disease), stage III   . Creatinine elevation   . Dementia  ([place])    "early onset" (08/04/2017)  . Depression    bipolar [facility]  . Diabetes mellitus without complication ([place])   . Family history of anesthesia complication    pt is unsure , but pt father may have been difficult to arouse   . HCAP (healthcare-associated pneumonia) 10/31/2012  . History of kidney stones   . Hypertension   . Hypogonadism male   . Liver fatty degeneration   . Microscopic hematuria    hereditary s/p Urology eval  . Osteoarthritis of right hip 11/28/2011   2012 2015 s/p THR Severe  Dr Novella Olive    . Pleural effusion 11/02/2012  . Pneumonia 10-2012  . Pneumonia, organism unspecified(486) 11/02/2012  . Polysubstance dependence, non-opioid, in remission ([place])    remote  . Primary osteoarthritis of left hip 05/22/2015  . PTSD (post-traumatic stress disorder)    SOCIAL ANXIETY DISORDER   . Suicide attempt by multiple drug overdose ([place]) December 29, 2015   Grieving his cat's death 18-Jun-2015    Past Surgical History:  Procedure Laterality Date  . BACK SURGERY    . CLOSED REDUCTION METACARPAL WITH PERCUTANEOUS PINNING Right   . LUMBAR DISC SURGERY    . TONSILLECTOMY    . TOTAL HIP ARTHROPLASTY Right 08/16/2013   Procedure: TOTAL HIP ARTHROPLASTY ANTERIOR APPROACH;  Surgeon: Hessie Dibble, MD;  Location: [facility];  Service: Orthopedics;  Laterality: Right;  . TOTAL HIP ARTHROPLASTY Left 05/22/2015   Procedure: TOTAL HIP ARTHROPLASTY ANTERIOR APPROACH;  Surgeon: Melrose Nakayama, MD;  Location: [facility];  Service: Orthopedics;  Laterality: Left;    Family History  Problem Relation Age of Onset  . Diabetes Father   . Cancer Mother        died of melanoma with mets  . Cervical cancer Sister   . Diabetes Sister   . Other Neg Hx        hypogonadism    Social History   Socioeconomic History  . Marital status: Single    Spouse name: Not on file  . Number of children: Not on file  . Years of education: Not on file  . Highest education level: Not on file  Occupational History   . Occupation: disability  Social Needs  . Financial resource strain: Not on file  . Food insecurity    Worry: Not on file    Inability: Not on file  . Transportation needs    Medical: Not on file    Non-medical: Not on file  Tobacco Use  . Smoking status: Former Smoker    Years: 0.00    Types: Cigarettes    Quit date: 02/19/2014    Years since quitting: 5.0  . Smokeless tobacco: Never Used  Substance and Sexual Activity  . Alcohol use: No  . Drug use: No    Comment: hx of marijuana/cocaine/crack use but sober since 20's  . Sexual activity: Not Currently  Lifestyle  . Physical activity    Days per week: Not on file    Minutes per session: Not on file  . Stress: Not on file  Relationships  . Social Herbalist on phone: Not on file    Gets together: Not on file    Attends religious service: Not on file    Active member of club or organization: Not on file    Attends meetings of clubs or organizations: Not on file    Relationship status: Not on file  . Intimate partner violence    Fear of current or ex partner: Not on file    Emotionally abused: Not on file    Physically abused: Not on file    Forced sexual activity: Not on file  Other Topics Concern  . Not on file  Social History Narrative   regualar exercise-no    Outpatient Medications Prior to Visit  Medication Sig Dispense Refill  . acetaminophen (TYLENOL) 325 MG tablet Take 2 tablets (650 mg total) by mouth every 6 (six) hours as needed for mild pain (or Fever >/= 101).    . ARIPiprazole (ABILIFY) 5 MG tablet TAKE 1 TABLET BY MOUTH DAILY FOR DEPRESSION (Patient taking differently: Take 5 mg by mouth daily. ) 30 tablet 0  . buPROPion (WELLBUTRIN XL) 300 MG 24 hr tablet Take 1 tablet (300 mg total) by mouth daily. 30 tablet 0  . feeding supplement, ENSURE ENLIVE, (ENSURE ENLIVE) LIQD Take 237 mLs by mouth 2 (two) times daily between meals. 237 mL 12  . fluticasone (FLONASE) 50 MCG/ACT nasal spray SHAKE  LIQUID AND USE 2 SPRAYS IN EACH NOSTRIL DAILY 16 g 2  . glucose blood (ACCU-CHEK GUIDE) test strip CHECK SUGAR FOUR TIMES DAILY 100 each 12  . insulin aspart (NOVOLOG FLEXPEN) 100 UNIT/ML FlexPen 0 to 12 units subcutaneously 3 times daily as per sliding scale 15 mL 11  . Insulin Pen Needle 31G  X 5 MM MISC 1 each by Does not apply route 4 (four) times daily. 120 each 5  . omeprazole (PRILOSEC) 40 MG capsule TAKE 1 CAPSULE(40 MG) BY MOUTH DAILY 30 capsule 2  . QUEtiapine (SEROQUEL) 400 MG tablet Take 1 tablet (400 mg total) by mouth at bedtime. 30 tablet 11  . traZODone (DESYREL) 150 MG tablet Take 1 tablet (150 mg total) by mouth at bedtime. 30 tablet 1  . atorvastatin (LIPITOR) 10 MG tablet TAKE 1 TABLET(10 MG) BY MOUTH DAILY 30 tablet 6  . gabapentin (NEURONTIN) 300 MG capsule Take 1 capsule (300 mg total) by mouth 3 (three) times daily. 60 capsule 6  . Insulin Detemir (LEVEMIR FLEXTOUCH) 100 UNIT/ML Pen Inject twice daily subcutaneously 38units in the morning and 38 units in the evening 15 mL 6  . linagliptin (TRADJENTA) 5 MG TABS tablet Take 1 tablet (5 mg total) by mouth daily. 30 tablet 6  . methocarbamol (ROBAXIN) 500 MG tablet Take 2 tablets (1,000 mg total) by mouth 4 (four) times daily. 120 tablet 6  . tadalafil (CIALIS) 5 MG tablet TAKE ONE TABLET BY MOUTH EVERY DAY FOR BPH 30 tablet 2  . traMADol (ULTRAM) 50 MG tablet Take 1 tablet (50 mg total) by mouth at bedtime. 30 tablet 2  . bacitracin ointment Apply 1 application topically 3 (three) times daily. Apply to right fifth toe (Patient not taking: Reported on 07/20/2018) 120 g 0  . cyanocobalamin 500 MCG tablet Take 1 tablet (500 mcg total) by mouth daily. (Patient not taking: Reported on 07/20/2018) 30 tablet 0  . diclofenac sodium (VOLTAREN) 1 % GEL Apply 4 g topically 4 (four) times daily. (Patient not taking: Reported on 07/20/2018) 100 g 1  . divalproex (DEPAKOTE) 500 MG DR tablet Take 3 tablets (1,500 mg total) by mouth at bedtime. 90  tablet 0  . lidocaine (LIDODERM) 5 % Place 1 patch onto the skin daily. Remove & Discard patch within 12 hours or as directed by MD (Patient not taking: Reported on 07/20/2018) 30 patch 0  . varenicline (CHANTIX CONTINUING MONTH PAK) 1 MG tablet Take 1 tablet (1 mg total) by mouth 2 (two) times daily. (Patient not taking: Reported on 11/17/2018) 60 tablet 2  . varenicline (CHANTIX STARTING MONTH PAK) 0.5 MG X 11 & 1 MG X 42 tablet Take one 0.5 mg tablet by mouth once daily for 3 days, then increase to one 0.5 mg tablet twice daily for 4 days, then increase to one 1 mg tablet twice daily. (Patient not taking: Reported on 11/17/2018) 53 tablet 0  . Testosterone (ANDROGEL) 20.25 MG/1.25GM (1.62%) GEL Apply 20.25 mg topically every morning. Apply topically under arm pit alternating with each application. (Patient not taking: Reported on 07/20/2018) 1.25 g 3   No facility-administered medications prior to visit.     Allergies  Allergen Reactions  . Vicodin [Hydrocodone-Acetaminophen] Itching    ROS Review of Systems  Constitutional: Negative for fatigue, fever and unexpected weight change.  HENT: Negative for congestion, rhinorrhea, sinus pressure and sinus pain.   Eyes: Negative for visual disturbance.  Respiratory: Negative for cough, chest tightness and shortness of breath.   Cardiovascular: Negative for chest pain, palpitations and leg swelling.  Gastrointestinal: Negative for abdominal distention, abdominal pain, constipation, diarrhea, nausea and vomiting.  Endocrine: Negative for polydipsia and polyuria.  Genitourinary: Positive for difficulty urinating and urgency. Negative for decreased urine volume and dysuria.  Musculoskeletal: Positive for arthralgias and back pain. Negative for myalgias.  Lumbar spine Bilateral shoulders  Skin: Negative for color change and rash.  Neurological: Negative for dizziness, tremors, weakness and numbness.  Hematological: Does not bruise/bleed easily.   Psychiatric/Behavioral: Negative for agitation and behavioral problems.      Objective:    Physical Exam  Constitutional: He is oriented to person, place, and time. He appears well-developed and well-nourished. No distress.  HENT:  Head: Normocephalic and atraumatic.  Eyes: Pupils are equal, round, and reactive to light. Conjunctivae and EOM are normal.  Neck: Normal range of motion. Neck supple.  Cardiovascular: Normal rate, regular rhythm, normal heart sounds and intact distal pulses.  No murmur heard. Pulmonary/Chest: Effort normal and breath sounds normal. No respiratory distress.  Abdominal: Soft. Bowel sounds are normal. He exhibits no distension. There is no abdominal tenderness.  Musculoskeletal:        General: No edema.     Right shoulder: He exhibits decreased range of motion and pain. He exhibits no crepitus.     Left shoulder: He exhibits decreased range of motion and pain. He exhibits no crepitus.     Lumbar back: He exhibits tenderness and pain.     Comments: Decreased external rotation - bilateral shoulders Positive straight leg raise  Neurological: He is alert and oriented to person, place, and time.  Skin: Skin is warm and dry. No rash noted.  Psychiatric: He has a normal mood and affect. His behavior is normal.    BP (!) 147/100   Pulse 70   Temp 98 F (36.7 C) (Oral)   Ht 6' 5"  (1.956 m)   Wt 227 lb (103 kg)   SpO2 100%   BMI 26.92 kg/m  Wt Readings from Last 3 Encounters:  02/21/19 227 lb (103 kg)  09/18/18 215 lb (97.5 kg)  07/20/18 214 lb (97.1 kg)     Health Maintenance Due  Topic Date Due  . OPHTHALMOLOGY EXAM  12/14/1977  . COLONOSCOPY  12/14/2017  . FOOT EXAM  05/27/2018    There are no preventive care reminders to display for this patient.  Lab Results  Component Value Date   TSH 2.108 08/04/2017   Lab Results  Component Value Date   WBC 6.5 09/18/2018   HGB 14.4 09/18/2018   HCT 40.9 09/18/2018   MCV 90.3 09/18/2018   PLT  106 (L) 09/18/2018   Lab Results  Component Value Date   NA 137 09/18/2018   K 4.5 09/18/2018   CO2 23 09/18/2018   GLUCOSE 161 (H) 09/18/2018   BUN 27 (H) 09/18/2018   CREATININE 1.70 (H) 09/18/2018   BILITOT 0.3 05/12/2018   ALKPHOS 102 05/12/2018   AST 24 05/12/2018   ALT 15 05/12/2018   PROT 7.6 05/12/2018   ALBUMIN 4.9 05/12/2018   CALCIUM 9.1 09/18/2018   ANIONGAP 9 09/18/2018   GFR 33.36 (L) 06/02/2016   Lab Results  Component Value Date   CHOL 141 02/10/2017   Lab Results  Component Value Date   HDL 34 (L) 02/10/2017   Lab Results  Component Value Date   LDLCALC 55 02/10/2017   Lab Results  Component Value Date   TRIG 259 (H) 02/10/2017   Lab Results  Component Value Date   CHOLHDL 4.1 02/10/2017   Lab Results  Component Value Date   HGBA1C 7.0 02/21/2019      Assessment & Plan:   1. Type 2 diabetes mellitus with stage 3a chronic kidney disease, with long-term current use of insulin (HCC) Controlled with A1c of  7.0 improved from 8.0 in May/2020 Congratulated patient on improvement in A1c.  Educated on the importance of compliance with diabetic diet. Counseled on Diabetic diet, my plate method, 829 minutes of moderate intensity exercise/week Keep blood sugar logs with fasting goals of 80-120 mg/dl, random of less than 180 and in the event of sugars less than 60 mg/dl or greater than 400 mg/dl please notify the clinic ASAP. It is recommended that you undergo annual eye exams and annual foot exams. Last eye exam 09/2017 Foot exam completed today - POCT glucose (manual entry) - POCT glycosylated hemoglobin (Hb A1C) - Lipid panel; Future - Basic Metabolic Panel; Future - atorvastatin (LIPITOR) 10 MG tablet; TAKE 1 TABLET(10 MG) BY MOUTH DAILY  Dispense: 30 tablet; Refill: 6 - Insulin Detemir (LEVEMIR FLEXTOUCH) 100 UNIT/ML Pen; Inject twice daily subcutaneously 38units in the morning and 38 units in the evening  Dispense: 15 mL; Refill: 6 - linagliptin  (TRADJENTA) 5 MG TABS tablet; Take 1 tablet (5 mg total) by mouth daily.  Dispense: 30 tablet; Refill: 6 - Microalbumin/Creatinine Ratio, Urine  2. Essential hypertension New diagnosis Counseled on blood pressure goal of less than 130/80, low-sodium, DASH diet, medication compliance, 150 minutes of moderate intensity exercise per week. Discussed medication compliance, adverse effects. Begin lisinopril-hydrochlorothiazide Recheck potassium level in 2 weeks. - CMP14+EGFR; Future - lisinopril-hydrochlorothiazide (ZESTORETIC) 10-12.5 MG tablet; Take 1 tablet by mouth daily.  Dispense: 30 tablet; Refill: 6  3. Degenerative disc disease, lumbar Controlled on tramadol - gabapentin (NEURONTIN) 300 MG capsule; Take 1 capsule (300 mg total) by mouth 3 (three) times daily.  Dispense: 60 capsule; Refill: 6 - methocarbamol (ROBAXIN) 500 MG tablet; Take 2 tablets (1,000 mg total) by mouth 4 (four) times daily.  Dispense: 120 tablet; Refill: 6 - traMADol (ULTRAM) 50 MG tablet; Take 1 tablet (50 mg total) by mouth at bedtime.  Dispense: 30 tablet; Refill: 2  4. Benign prostatic hyperplasia with urinary retention Ambulatory referral to Urology - tadalafil (CIALIS) 5 MG tablet; TAKE ONE TABLET BY MOUTH EVERY DAY FOR BPH  Dispense: 30 tablet; Refill: 2  5. Hypogonadism male Resume testosterone Monitor PSA and CBC - Testosterone (ANDROGEL) 20.25 MG/1.25GM (1.62%) GEL; Apply 20.25 mg topically every morning. Apply topically under arm pit alternating with each application.  Dispense: 1.25 g; Refill: 3  6. Schizoaffective disorder, bipolar type ([place]) Under the care of family services of the [facility] ordered this encounter  Medications  . atorvastatin (LIPITOR) 10 MG tablet    Sig: TAKE 1 TABLET(10 MG) BY MOUTH DAILY    Dispense:  30 tablet    Refill:  6  . gabapentin (NEURONTIN) 300 MG capsule    Sig: Take 1 capsule (300 mg total) by mouth 3 (three) times daily.    Dispense:  60 capsule     Refill:  6  . Insulin Detemir (LEVEMIR FLEXTOUCH) 100 UNIT/ML Pen    Sig: Inject twice daily subcutaneously 38units in the morning and 38 units in the evening    Dispense:  15 mL    Refill:  6    Dose change  . linagliptin (TRADJENTA) 5 MG TABS tablet    Sig: Take 1 tablet (5 mg total) by mouth daily.    Dispense:  30 tablet    Refill:  6  . methocarbamol (ROBAXIN) 500 MG tablet    Sig: Take 2 tablets (1,000 mg total) by mouth 4 (four) times daily.    Dispense:  120 tablet  Refill:  6  . tadalafil (CIALIS) 5 MG tablet    Sig: TAKE ONE TABLET BY MOUTH EVERY DAY FOR BPH    Dispense:  30 tablet    Refill:  2  . Testosterone (ANDROGEL) 20.25 MG/1.25GM (1.62%) GEL    Sig: Apply 20.25 mg topically every morning. Apply topically under arm pit alternating with each application.    Dispense:  1.25 g    Refill:  3    Dispense Pump  . traMADol (ULTRAM) 50 MG tablet    Sig: Take 1 tablet (50 mg total) by mouth at bedtime.    Dispense:  30 tablet    Refill:  2    Dx; Degenerative Disc Disease  . lisinopril-hydrochlorothiazide (ZESTORETIC) 10-12.5 MG tablet    Sig: Take 1 tablet by mouth daily.    Dispense:  30 tablet    Refill:  6    Follow-up: Return in about 3 months (around 05/24/2019) for medical conditions.    Tomasita Morrow, RN

## 2019-02-23 DIAGNOSIS — F4310 Post-traumatic stress disorder, unspecified: Secondary | ICD-10-CM | POA: Diagnosis not present

## 2019-02-27 ENCOUNTER — Other Ambulatory Visit: Payer: Self-pay | Admitting: Family Medicine

## 2019-02-28 ENCOUNTER — Other Ambulatory Visit: Payer: Medicare HMO

## 2019-03-03 ENCOUNTER — Other Ambulatory Visit: Payer: Self-pay | Admitting: Family Medicine

## 2019-03-03 DIAGNOSIS — K219 Gastro-esophageal reflux disease without esophagitis: Secondary | ICD-10-CM

## 2019-03-09 DIAGNOSIS — F4310 Post-traumatic stress disorder, unspecified: Secondary | ICD-10-CM | POA: Diagnosis not present

## 2019-03-10 DIAGNOSIS — F4310 Post-traumatic stress disorder, unspecified: Secondary | ICD-10-CM | POA: Diagnosis not present

## 2019-03-21 DIAGNOSIS — F4310 Post-traumatic stress disorder, unspecified: Secondary | ICD-10-CM | POA: Diagnosis not present

## 2019-03-23 ENCOUNTER — Emergency Department (HOSPITAL_COMMUNITY)
Admission: EM | Admit: 2019-03-23 | Discharge: 2019-03-23 | Disposition: A | Discharge status: Home / Self Care | Payer: Medicare HMO | Attending: Emergency Medicine | Admitting: Emergency Medicine

## 2019-03-23 ENCOUNTER — Encounter (HOSPITAL_COMMUNITY): Payer: Self-pay | Admitting: Emergency Medicine

## 2019-03-23 ENCOUNTER — Emergency Department (HOSPITAL_COMMUNITY): Payer: Medicare HMO

## 2019-03-23 ENCOUNTER — Other Ambulatory Visit: Payer: Self-pay

## 2019-03-23 DIAGNOSIS — M549 Dorsalgia, unspecified: Secondary | ICD-10-CM

## 2019-03-23 DIAGNOSIS — F250 Schizoaffective disorder, bipolar type: Secondary | ICD-10-CM | POA: Insufficient documentation

## 2019-03-23 DIAGNOSIS — N183 Chronic kidney disease, stage 3 unspecified: Secondary | ICD-10-CM | POA: Diagnosis not present

## 2019-03-23 DIAGNOSIS — N200 Calculus of kidney: Secondary | ICD-10-CM | POA: Diagnosis not present

## 2019-03-23 DIAGNOSIS — Z794 Long term (current) use of insulin: Secondary | ICD-10-CM | POA: Diagnosis not present

## 2019-03-23 DIAGNOSIS — R1030 Lower abdominal pain, unspecified: Secondary | ICD-10-CM | POA: Insufficient documentation

## 2019-03-23 DIAGNOSIS — M546 Pain in thoracic spine: Secondary | ICD-10-CM

## 2019-03-23 DIAGNOSIS — Z79899 Other long term (current) drug therapy: Secondary | ICD-10-CM | POA: Diagnosis not present

## 2019-03-23 DIAGNOSIS — E1122 Type 2 diabetes mellitus with diabetic chronic kidney disease: Secondary | ICD-10-CM | POA: Insufficient documentation

## 2019-03-23 DIAGNOSIS — Z87891 Personal history of nicotine dependence: Secondary | ICD-10-CM | POA: Insufficient documentation

## 2019-03-23 DIAGNOSIS — R300 Dysuria: Secondary | ICD-10-CM | POA: Insufficient documentation

## 2019-03-23 DIAGNOSIS — I129 Hypertensive chronic kidney disease with stage 1 through stage 4 chronic kidney disease, or unspecified chronic kidney disease: Secondary | ICD-10-CM | POA: Insufficient documentation

## 2019-03-23 DIAGNOSIS — M2578 Osteophyte, vertebrae: Secondary | ICD-10-CM | POA: Diagnosis not present

## 2019-03-23 DIAGNOSIS — M545 Low back pain: Secondary | ICD-10-CM | POA: Diagnosis not present

## 2019-03-23 LAB — COMPREHENSIVE METABOLIC PANEL
ALT: 14 U/L (ref 0–44)
AST: 18 U/L (ref 15–41)
Albumin: 3.2 g/dL — ABNORMAL LOW (ref 3.5–5.0)
Alkaline Phosphatase: 64 U/L (ref 38–126)
Anion gap: 9 (ref 5–15)
BUN: 14 mg/dL (ref 6–20)
CO2: 27 mmol/L (ref 22–32)
Calcium: 8.7 mg/dL — ABNORMAL LOW (ref 8.9–10.3)
Chloride: 104 mmol/L (ref 98–111)
Creatinine, Ser: 1.9 mg/dL — ABNORMAL HIGH (ref 0.61–1.24)
GFR calc Af Amer: 46 mL/min — ABNORMAL LOW (ref >60)
GFR calc non Af Amer: 40 mL/min — ABNORMAL LOW (ref >60)
Glucose, Bld: 246 mg/dL — ABNORMAL HIGH (ref 70–99)
Potassium: 3.6 mmol/L (ref 3.5–5.1)
Sodium: 140 mmol/L (ref 135–145)
Total Bilirubin: 0.4 mg/dL (ref 0.3–1.2)
Total Protein: 5.9 g/dL — ABNORMAL LOW (ref 6.5–8.1)

## 2019-03-23 LAB — CBC WITH DIFFERENTIAL/PLATELET
Abs Immature Granulocytes: 0.03 10*3/uL (ref 0.00–0.07)
Basophils Absolute: 0 10*3/uL (ref 0.0–0.1)
Basophils Relative: 1 %
Eosinophils Absolute: 0 10*3/uL (ref 0.0–0.5)
Eosinophils Relative: 1 %
HCT: 37.9 % — ABNORMAL LOW (ref 39.0–52.0)
Hemoglobin: 13.5 g/dL (ref 13.0–17.0)
Immature Granulocytes: 1 %
Lymphocytes Relative: 43 %
Lymphs Abs: 1.5 10*3/uL (ref 0.7–4.0)
MCH: 31.1 pg (ref 26.0–34.0)
MCHC: 35.6 g/dL (ref 30.0–36.0)
MCV: 87.3 fL (ref 80.0–100.0)
Monocytes Absolute: 0.4 10*3/uL (ref 0.1–1.0)
Monocytes Relative: 11 %
Neutro Abs: 1.6 10*3/uL — ABNORMAL LOW (ref 1.7–7.7)
Neutrophils Relative %: 43 %
Platelets: 88 10*3/uL — ABNORMAL LOW (ref 150–400)
RBC: 4.34 MIL/uL (ref 4.22–5.81)
RDW: 12.4 % (ref 11.5–15.5)
WBC: 3.6 10*3/uL — ABNORMAL LOW (ref 4.0–10.5)
nRBC: 0 % (ref 0.0–0.2)

## 2019-03-23 LAB — URINALYSIS, ROUTINE W REFLEX MICROSCOPIC
Bacteria, UA: NONE SEEN
Bilirubin Urine: NEGATIVE
Glucose, UA: >=500 mg/dL — AB
Hgb urine dipstick: NEGATIVE
Ketones, ur: NEGATIVE mg/dL
Leukocytes,Ua: NEGATIVE
Nitrite: NEGATIVE
Protein, ur: 30 mg/dL — AB
Specific Gravity, Urine: 1.031 — ABNORMAL HIGH (ref 1.005–1.030)
pH: 5 (ref 5.0–8.0)

## 2019-03-23 MED ORDER — MORPHINE SULFATE (PF) 4 MG/ML IV SOLN
4.0000 mg | Freq: Once | INTRAVENOUS | Status: AC
  Administered 2019-03-23: 4 mg via INTRAVENOUS
  Filled 2019-03-23: qty 1

## 2019-03-23 MED ORDER — TIZANIDINE HCL 4 MG PO TABS: 4.0000 mg | ORAL_TABLET | Freq: Three times a day (TID) | ORAL | 0 refills | Status: DC | PRN

## 2019-03-23 MED ORDER — PREDNISONE 50 MG PO TABS: 50.0000 mg | ORAL_TABLET | Freq: Every day | ORAL | 0 refills | Status: DC

## 2019-03-23 MED ORDER — HYDROMORPHONE HCL 1 MG/ML IJ SOLN
0.5000 mg | Freq: Once | INTRAMUSCULAR | Status: AC
  Administered 2019-03-23: 0.5 mg via INTRAVENOUS
  Filled 2019-03-23: qty 1

## 2019-03-23 MED ORDER — DIPHENHYDRAMINE HCL 50 MG/ML IJ SOLN
25.0000 mg | Freq: Once | INTRAMUSCULAR | Status: AC
  Administered 2019-03-23: 23:00:00 25 mg via INTRAVENOUS

## 2019-03-23 NOTE — ED Notes (Signed)
Patient transported to CT 

## 2019-03-23 NOTE — Discharge Instructions (Addendum)
Take prednisone as prescribed until completed.  Take tizanidine as prescribed as needed for muscle pain or spasms.  Do not take this with Robaxin, take 1 or the other.  Please follow-up with your doctor and the orthopedic doctor for further evaluation and treatment of your symptoms.  Please return the emergency department if you develop any new or worsening symptoms.

## 2019-03-23 NOTE — ED Provider Notes (Signed)
[facility] EMERGENCY DEPARTMENT Provider Note   CSN: [id number] Arrival date & time: 03/23/19  7741     History   Chief Complaint Chief Complaint  Patient presents with   Back Pain    HPI [name] is a 53 y.o. male with history of hypertension, diabetes, he stage III, bipolar 1 disorder, previous back surgeries right sided back pain with associated abdominal pain.  Patient reports some associated testicle pain intermittently as well.  He has had some dysuria intermittently for an indeterminate amount of time.  Patient denies any injury.  Patient reports it feels like his abdominal pain radiates to his back.  It is worse with movement.  He reports he always has blood in his urine, so this is unchanged.  Reports he has history of neuropathy so he always has numbness in his legs.  He denies any saddle anesthesia or bowel or bladder incontinence, history of IVDU or cancer.  Patient was not an injection to his back for 2 to 3 years.  Last back surgery was Jul 30, 2006.  Patient denies any fevers, chest pain, shortness of breath.  Patient has been taking Tylenol at home without relief.     HPI  Past Medical History:  Diagnosis Date   ADD (attention deficit disorder)    Anxiety    Arthritis    right hip   Bipolar 1 disorder (HCC)    Blood in urine    CKD (chronic kidney disease), stage III    Creatinine elevation    Dementia ([place])    "early onset" (08/04/2017)   Depression    bipolar [facility]   Diabetes mellitus without complication ([place])    Family history of anesthesia complication    pt is unsure , but pt father may have been difficult to arouse    HCAP (healthcare-associated pneumonia) 10/31/2012   History of kidney stones    Hypertension    Hypogonadism male    Liver fatty degeneration    Microscopic hematuria    hereditary s/p Urology eval   Osteoarthritis of right hip 11/28/2011   2012 2015 s/p THR Severe  Dr Novella Olive     Pleural  effusion 11/02/2012   Pneumonia 10-2012   Pneumonia, organism unspecified([place]) 11/02/2012   Polysubstance dependence, non-opioid, in remission ([place])    remote   Primary osteoarthritis of left hip 05/22/2015   PTSD (post-traumatic stress disorder)    SOCIAL ANXIETY DISORDER    Suicide attempt by multiple drug overdose ([place]) 2016/01/13   Grieving his cat's death 07/30/15    Patient Active Problem List   Diagnosis Date Noted   Closed displaced fracture of lateral malleolus of left fibula 08/02/2017   BPH (benign prostatic hyperplasia) 07/24/2017   Hemorrhoid 05/27/2017   Shoulder arthritis 05/27/2017   Diabetic neuropathy ([place]) 05/27/2017   Degenerative disc disease, lumbar 02/04/2017   Constipation    History of seizures 09/20/2016   Dementia, early onset with advanced brain atrophy for age 72/05/2016   Acute lower UTI    Urinary retention    Acute metabolic encephalopathy 28/78/6767   HLD (hyperlipidemia) 09/07/2016   GERD (gastroesophageal reflux disease) 09/07/2016   PTSD (post-traumatic stress disorder) 09/07/2016   Acute encephalopathy 09/07/2016   Fall 09/07/2016   Chronic kidney disease    Ataxia 10/14/2015   Hypokalemia 10/03/2015   Hemiparesis ([place])    Dyslipidemia 05/31/2014   Insomnia 11/11/2013   Degenerative joint disease (DJD) of hip 08/16/2013   Erectile dysfunction 02/17/2011  Constipation - functional 02/17/2011   Diabetes type 2, uncontrolled ([place]) 11/06/2010   Hypogonadism male 05/20/2010   TOBACCO USER 05/20/2010   Demoralization and apathy 05/20/2010   Schizoaffective disorder, bipolar type ([place]) 01/18/2010   Right shoulder pain 01/18/2010   Anxiety state 12/05/2006   Essential hypertension 12/05/2006    Past Surgical History:  Procedure Laterality Date   BACK SURGERY     CLOSED REDUCTION METACARPAL WITH PERCUTANEOUS PINNING Right    LUMBAR [place]     TOTAL HIP ARTHROPLASTY Right  08/16/2013   Procedure: TOTAL HIP ARTHROPLASTY ANTERIOR APPROACH;  Surgeon: Hessie Dibble, MD;  Location: [facility];  Service: Orthopedics;  Laterality: Right;   TOTAL HIP ARTHROPLASTY Left 05/22/2015   Procedure: TOTAL HIP ARTHROPLASTY ANTERIOR APPROACH;  Surgeon: Melrose Nakayama, MD;  Location: [facility];  Service: Orthopedics;  Laterality: Left;        Home Medications    Prior to Admission medications   Medication Sig Start Date End Date Taking? Authorizing Provider  acetaminophen (TYLENOL) 325 MG tablet Take 2 tablets (650 mg total) by mouth every 6 (six) hours as needed for mild pain (or Fever >/= 101). 08/11/17   Elgergawy, Silver Huguenin, MD  ARIPiprazole (ABILIFY) 5 MG tablet TAKE 1 TABLET BY MOUTH DAILY FOR DEPRESSION Patient taking differently: Take 5 mg by mouth daily.  06/23/18   [name], MD  atorvastatin (LIPITOR) 10 MG tablet TAKE 1 TABLET(10 MG) BY MOUTH DAILY 02/21/19   [name], MD  bacitracin ointment Apply 1 application topically 3 (three) times daily. Apply to right fifth toe Patient not taking: Reported on 07/20/2018 08/11/17   Elgergawy, Silver Huguenin, MD  buPROPion (WELLBUTRIN XL) 300 MG 24 hr tablet Take 1 tablet (300 mg total) by mouth daily. 10/13/16   Elgergawy, Silver Huguenin, MD  CHANTIX 1 MG tablet TAKE 1 TABLET(1 MG) BY MOUTH TWICE DAILY 02/28/19   [name], MD  cyanocobalamin 500 MCG tablet Take 1 tablet (500 mcg total) by mouth daily. Patient not taking: Reported on 07/20/2018 10/13/16   Elgergawy, Silver Huguenin, MD  diclofenac sodium (VOLTAREN) 1 % GEL Apply 4 g topically 4 (four) times daily. Patient not taking: Reported on 07/20/2018 02/04/17   [name], MD  divalproex (DEPAKOTE) 500 MG DR tablet Take 3 tablets (1,500 mg total) by mouth at bedtime. 10/13/16 07/20/18  Elgergawy, Silver Huguenin, MD  feeding supplement, ENSURE ENLIVE, (ENSURE ENLIVE) LIQD Take 237 mLs by mouth 2 (two) times daily between meals. 08/11/17   Elgergawy, Silver Huguenin, MD  fluticasone (FLONASE) 50  MCG/ACT nasal spray SHAKE LIQUID AND USE 2 SPRAYS IN EACH NOSTRIL DAILY 10/25/18   [name], MD  gabapentin (NEURONTIN) 300 MG capsule Take 1 capsule (300 mg total) by mouth 3 (three) times daily. 02/21/19   [name], MD  glucose blood (ACCU-CHEK GUIDE) test strip CHECK SUGAR FOUR TIMES DAILY 07/22/18   [name], MD  insulin aspart (NOVOLOG FLEXPEN) 100 UNIT/ML FlexPen 0 to 12 units subcutaneously 3 times daily as per sliding scale 07/23/18   [name], MD  Insulin Detemir (LEVEMIR FLEXTOUCH) 100 UNIT/ML Pen Inject twice daily subcutaneously 38units in the morning and 38 units in the evening 02/21/19   [name], MD  Insulin Pen Needle 31G X 5 MM MISC 1 each by Does not apply route 4 (four) times daily. 07/23/18   [name], MD  lidocaine (LIDODERM) 5 % Place 1 patch onto the skin daily. Remove & Discard  patch within 12 hours or as directed by MD Patient not taking: Reported on 07/20/2018 05/12/18   [name], MD  linagliptin (TRADJENTA) 5 MG TABS tablet Take 1 tablet (5 mg total) by mouth daily. 02/21/19   [name], MD  lisinopril-hydrochlorothiazide (ZESTORETIC) 10-12.5 MG tablet Take 1 tablet by mouth daily. 02/21/19   [name], MD  methocarbamol (ROBAXIN) 500 MG tablet Take 2 tablets (1,000 mg total) by mouth 4 (four) times daily. 02/21/19   [name], MD  omeprazole (PRILOSEC) 40 MG capsule TAKE 1 CAPSULE(40 MG) BY MOUTH DAILY 03/03/19   [name], MD  predniSONE (DELTASONE) 50 MG tablet Take 1 tablet (50 mg total) by mouth daily with breakfast. 03/23/19   Tremane Spurgeon, Bea Graff, PA-C  QUEtiapine (SEROQUEL) 400 MG tablet Take 1 tablet (400 mg total) by mouth at bedtime. 10/13/16   Elgergawy, Silver Huguenin, MD  tadalafil (CIALIS) 5 MG tablet TAKE ONE TABLET BY MOUTH EVERY DAY FOR BPH 02/21/19   [name], MD  Testosterone (ANDROGEL) 20.25 MG/1.25GM (1.62%) GEL Apply 20.25 mg topically every morning. Apply topically under arm pit alternating with each  application. 02/21/19   [name], MD  tiZANidine (ZANAFLEX) 4 MG tablet Take 1 tablet (4 mg total) by mouth every 8 (eight) hours as needed for muscle spasms. 03/23/19   Calene Paradiso, Bea Graff, PA-C  traMADol (ULTRAM) 50 MG tablet Take 1 tablet (50 mg total) by mouth at bedtime. 02/21/19   [name], MD  traZODone (DESYREL) 150 MG tablet Take 1 tablet (150 mg total) by mouth at bedtime. 10/13/16   Elgergawy, Silver Huguenin, MD    Family History Family History  Problem Relation Age of Onset   Diabetes Father    Cancer Mother        died of melanoma with mets   Cervical cancer Sister    Diabetes Sister    Other Neg Hx        hypogonadism    Social History Social History   Tobacco Use   Smoking status: Former Smoker    Years: 0.00    Types: Cigarettes    Quit date: 02/19/2014    Years since quitting: 5.0   Smokeless tobacco: Never Used  Substance Use Topics   Alcohol use: No   Drug use: No    Comment: hx of marijuana/cocaine/crack use but sober since 20's     Allergies   Vicodin [hydrocodone-acetaminophen]   Review of Systems Review of Systems  Constitutional: Negative for chills and fever.  HENT: Negative for facial swelling and sore throat.   Respiratory: Negative for shortness of breath.   Cardiovascular: Negative for chest pain.  Gastrointestinal: Positive for abdominal pain. Negative for nausea and vomiting.  Genitourinary: Positive for dysuria.  Musculoskeletal: Positive for back pain.  Skin: Negative for rash and wound.  Neurological: Negative for headaches.  Psychiatric/Behavioral: The patient is not nervous/anxious.      Physical Exam Updated Vital Signs BP (!) 151/91 (BP Location: Left Arm)    Pulse 91    Temp 97.6 F (36.4 C) (Oral)    Resp 18    SpO2 98%   Physical Exam Vitals signs and nursing note reviewed.  Constitutional:      General: He is not in acute distress.    Appearance: He is well-developed. He is not diaphoretic.  HENT:      Head: Normocephalic and atraumatic.     Mouth/Throat:     Pharynx: No oropharyngeal exudate.  Eyes:  General: No scleral icterus.       Right eye: No discharge.        Left eye: No discharge.     Conjunctiva/sclera: Conjunctivae normal.     Pupils: Pupils are equal, round, and reactive to light.  Neck:     Musculoskeletal: Normal range of motion and neck supple.     Thyroid: No thyromegaly.  Cardiovascular:     Rate and Rhythm: Normal rate and regular rhythm.     Heart sounds: Normal heart sounds. No murmur. No friction rub. No gallop.   Pulmonary:     Effort: Pulmonary effort is normal. No respiratory distress.     Breath sounds: Normal breath sounds. No stridor. No wheezing or rales.  Abdominal:     General: Bowel sounds are normal. There is no distension.     Palpations: Abdomen is soft.     Tenderness: There is no abdominal tenderness. There is right CVA tenderness. There is no left CVA tenderness, guarding or rebound.  Musculoskeletal:       Back:     Comments: No midline cervical, thoracic, or lumbar tenderness  Lymphadenopathy:     Cervical: No cervical adenopathy.  Skin:    General: Skin is warm and dry.     Coloration: Skin is not pale.     Findings: No rash.  Neurological:     Mental Status: He is alert.     Coordination: Coordination normal.     Comments: 5/5 strength to all 4 extremities and decreased sensation bilaterally (baseline); equal bilateral grip strength      ED Treatments / Results  Labs (all labs ordered are listed, but only abnormal results are displayed) Labs Reviewed  COMPREHENSIVE METABOLIC PANEL - Abnormal; Notable for the following components:      Result Value   Glucose, Bld 246 (*)    Creatinine, Ser 1.90 (*)    Calcium 8.7 (*)    Total Protein 5.9 (*)    Albumin 3.2 (*)    GFR calc non Af Amer 40 (*)    GFR calc Af Amer 46 (*)    All other components within normal limits  CBC WITH DIFFERENTIAL/PLATELET - Abnormal; Notable for  the following components:   WBC 3.6 (*)    HCT 37.9 (*)    Platelets 88 (*)    Neutro Abs 1.6 (*)    All other components within normal limits  URINALYSIS, ROUTINE W REFLEX MICROSCOPIC - Abnormal; Notable for the following components:   Specific Gravity, Urine 1.031 (*)    Glucose, UA >=500 (*)    Protein, ur 30 (*)    All other components within normal limits    EKG None  Radiology Dg Thoracic Spine W/swimmers  Result Date: 03/23/2019 CLINICAL DATA:  Upper back pain EXAM: THORACIC SPINE - 3 VIEWS COMPARISON:  07/07/2016 FINDINGS: Sagittal alignment is within normal limits. Vertebral body heights are maintained. Large anterior osteophytes of the lower thoracic spine. IMPRESSION: No acute osseous abnormality. Prominent anterior osteophytosis of the lower thoracic spine. Electronically Signed   By: Donavan Foil M.D.   On: 03/23/2019 20:52   Ct Thoracic Spine Wo Contrast  Result Date: 03/23/2019 CLINICAL DATA:  53 year old male with mid back pain. EXAM: CT THORACIC AND LUMBAR SPINE WITHOUT CONTRAST TECHNIQUE: Multidetector CT imaging of the thoracic and lumbar spine was performed without contrast. Multiplanar CT image reconstructions were also generated. COMPARISON:  Thoracic spine radiograph dated 03/23/2019. FINDINGS: CT THORACIC SPINE FINDINGS Alignment: Normal. Vertebrae:  No acute fracture or focal pathologic process. Paraspinal and other soft tissues: Negative. Disc levels: No significant disc disease. Multilevel anterior bridging osteophytes noted which may represent diffuse idiopathic skeletal hyperostosis. Probable mild narrowing of the neural foramina at T5-T6, T6-T7, and T8-T9 on the secondary to mild osteophyte. CT LUMBAR SPINE FINDINGS Segmentation: 5 lumbar type vertebrae. Alignment: Normal. Vertebrae: No acute fracture or focal pathologic process. Bilateral L4-L5 posterior pedicular screws. There is L5 laminectomy. Paraspinal and other soft tissues: The paraspinal soft tissues are  unremarkable. There is a 6 mm nonobstructing right renal interpolar calculus. A punctate nonobstructing left renal interpolar stone is noted. No hydronephrosis on either side. There is atherosclerotic calcification of the abdominal aorta. Disc levels: There is L4-L5 fusion. Mild posterior protrusion of disc osteophyte complex at L4-5. Probable mild narrowing of the left L4-L5 neural foramina due to osteophyte. Evaluation however is limited due to streak artifact. The remainder of the neural foramina appear patent. IMPRESSION: 1. No acute/traumatic thoracic or lumbar spine pathology. 2. Multilevel anterior bridging osteophytes which may represent diffuse idiopathic skeletal hyperostosis. Probable mild narrowing of the neural foramina at T5-T6, T6-T7, and T8-T9 on the secondary to mild osteophyte. 3. L5 laminectomy and L4-L5 fusion. 4. Nonobstructing bilateral renal calculi. No hydronephrosis. 5. Aortic Atherosclerosis (ICD10-I70.0). Electronically Signed   By: Anner Crete M.D.   On: 03/23/2019 22:10   Ct L-spine No Charge  Result Date: 03/23/2019 CLINICAL DATA:  53 year old male with mid back pain. EXAM: CT THORACIC AND LUMBAR SPINE WITHOUT CONTRAST TECHNIQUE: Multidetector CT imaging of the thoracic and lumbar spine was performed without contrast. Multiplanar CT image reconstructions were also generated. COMPARISON:  Thoracic spine radiograph dated 03/23/2019. FINDINGS: CT THORACIC SPINE FINDINGS Alignment: Normal. Vertebrae: No acute fracture or focal pathologic process. Paraspinal and other soft tissues: Negative. Disc levels: No significant disc disease. Multilevel anterior bridging osteophytes noted which may represent diffuse idiopathic skeletal hyperostosis. Probable mild narrowing of the neural foramina at T5-T6, T6-T7, and T8-T9 on the secondary to mild osteophyte. CT LUMBAR SPINE FINDINGS Segmentation: 5 lumbar type vertebrae. Alignment: Normal. Vertebrae: No acute fracture or focal pathologic  process. Bilateral L4-L5 posterior pedicular screws. There is L5 laminectomy. Paraspinal and other soft tissues: The paraspinal soft tissues are unremarkable. There is a 6 mm nonobstructing right renal interpolar calculus. A punctate nonobstructing left renal interpolar stone is noted. No hydronephrosis on either side. There is atherosclerotic calcification of the abdominal aorta. Disc levels: There is L4-L5 fusion. Mild posterior protrusion of disc osteophyte complex at L4-5. Probable mild narrowing of the left L4-L5 neural foramina due to osteophyte. Evaluation however is limited due to streak artifact. The remainder of the neural foramina appear patent. IMPRESSION: 1. No acute/traumatic thoracic or lumbar spine pathology. 2. Multilevel anterior bridging osteophytes which may represent diffuse idiopathic skeletal hyperostosis. Probable mild narrowing of the neural foramina at T5-T6, T6-T7, and T8-T9 on the secondary to mild osteophyte. 3. L5 laminectomy and L4-L5 fusion. 4. Nonobstructing bilateral renal calculi. No hydronephrosis. 5. Aortic Atherosclerosis (ICD10-I70.0). Electronically Signed   By: Anner Crete M.D.   On: 03/23/2019 22:10   Ct Renal Stone Study  Result Date: 03/23/2019 CLINICAL DATA:  Flank pain EXAM: CT ABDOMEN AND PELVIS WITHOUT CONTRAST TECHNIQUE: Multidetector CT imaging of the abdomen and pelvis was performed following the standard protocol without IV contrast. COMPARISON:  06/14/2013 FINDINGS: Lower chest: Scarring at the left base.  No acute abnormality. Hepatobiliary: No focal hepatic abnormality. Gallbladder unremarkable. Pancreas: No focal abnormality or ductal dilatation.  Spleen: No focal abnormality.  Normal size. Adrenals/Urinary Tract: 6 mm stone in the midpole of the right kidney. No renal stone on the left. No ureteral stone or hydronephrosis bilaterally. Adrenal glands and urinary bladder unremarkable. Stomach/Bowel: Normal appendix. Moderate stool in the colon. Stomach,  large and small bowel grossly unremarkable. Vascular/Lymphatic: Aortic atherosclerosis. No enlarged abdominal or pelvic lymph nodes. Reproductive: Prostate prominence. Other: No free fluid or free air. Musculoskeletal: No acute bony abnormality. Bilateral hip replacements. Postoperative changes in the lumbar spine. IMPRESSION: Right nephrolithiasis.  No ureteral stones or hydronephrosis. Moderate stool burden. Aortic atherosclerosis. Electronically Signed   By: Rolm Baptise M.D.   On: 03/23/2019 21:55    Procedures Procedures (including critical care time)  Medications Ordered in ED Medications  diphenhydrAMINE (BENADRYL) injection 25 mg (has no administration in time range)  morphine 4 MG/ML injection 4 mg (4 mg Intravenous Given 03/23/19 2046)  HYDROmorphone (DILAUDID) injection 0.5 mg (0.5 mg Intravenous Given 03/23/19 2119)     Initial Impression / Assessment and Plan / ED Course  I have reviewed the triage vital signs and the nursing notes.  Pertinent labs & imaging results that were available during my care of the patient were reviewed by me and considered in my medical decision making (see chart for details).        Patient presenting with acute on chronic back pain.  He was having some lower abdominal pain as well radiating to the back.  Labs stable for the patient.  UA shows glucose, otherwise negative. Patient neurovascularly intact.  No signs of cauda equina.  Return precautions discussed.  Patient understands and agrees with plan.  Patient vitals stable through Suspect patient's pain related to his chronic osteophytes and possible diffuse idiopathic skeletal hyperostosis.  He has thoracic neuroforaminal narrowing.  Offered prednisone and patient agrees to try understanding that this may increase his glucose temporarily.  He requests a different muscle relaxer than the Robaxin he has, which does not help.  We will try tizanidine.  Follow-up to PCP and orthopedic as planned.  out ED  course and discharged in satisfactory condition.  Patient also guided by my attending, Dr. Darl Householder, who guided the patient's management and agrees with plan.  Final Clinical Impressions(s) / ED Diagnoses   Final diagnoses:  Thoracic back pain  Back pain    ED Discharge Orders         Ordered    predniSONE (DELTASONE) 50 MG tablet  Daily with breakfast     03/23/19 2244    tiZANidine (ZANAFLEX) 4 MG tablet  Every 8 hours PRN     03/23/19 2244           Frederica Kuster, PA-C 03/23/19 2249    Drenda Freeze, MD 03/26/19 1501

## 2019-03-23 NOTE — ED Triage Notes (Signed)
C/o lower back pain x 2 weeks.  History of back surgery.

## 2019-03-23 NOTE — ED Notes (Signed)
Sent a urine culture with the urine specimen 

## 2019-03-28 ENCOUNTER — Other Ambulatory Visit: Payer: Self-pay | Admitting: Family Medicine

## 2019-03-28 DIAGNOSIS — Z794 Long term (current) use of insulin: Secondary | ICD-10-CM

## 2019-03-28 DIAGNOSIS — N1831 Chronic kidney disease, stage 3a: Secondary | ICD-10-CM

## 2019-04-03 ENCOUNTER — Other Ambulatory Visit: Payer: Self-pay | Admitting: Family Medicine

## 2019-04-04 ENCOUNTER — Other Ambulatory Visit: Payer: Self-pay | Admitting: Family Medicine

## 2019-04-04 DIAGNOSIS — E1122 Type 2 diabetes mellitus with diabetic chronic kidney disease: Secondary | ICD-10-CM

## 2019-04-05 DIAGNOSIS — F4310 Post-traumatic stress disorder, unspecified: Secondary | ICD-10-CM | POA: Diagnosis not present

## 2019-04-18 DIAGNOSIS — F4310 Post-traumatic stress disorder, unspecified: Secondary | ICD-10-CM | POA: Diagnosis not present

## 2019-04-21 ENCOUNTER — Encounter (HOSPITAL_COMMUNITY): Payer: Self-pay | Admitting: Emergency Medicine

## 2019-04-21 ENCOUNTER — Other Ambulatory Visit: Payer: Self-pay

## 2019-04-21 ENCOUNTER — Emergency Department (HOSPITAL_COMMUNITY)
Admission: EM | Admit: 2019-04-21 | Discharge: 2019-04-21 | Discharge status: Against Medical Advice | Payer: Medicare HMO | Attending: Emergency Medicine | Admitting: Emergency Medicine

## 2019-04-21 DIAGNOSIS — Z5321 Procedure and treatment not carried out due to patient leaving prior to being seen by health care provider: Secondary | ICD-10-CM | POA: Insufficient documentation

## 2019-04-21 DIAGNOSIS — R55 Syncope and collapse: Secondary | ICD-10-CM | POA: Diagnosis present

## 2019-04-21 LAB — CBC
HCT: 42.5 % (ref 39.0–52.0)
Hemoglobin: 15 g/dL (ref 13.0–17.0)
MCH: 30.8 pg (ref 26.0–34.0)
MCHC: 35.3 g/dL (ref 30.0–36.0)
MCV: 87.3 fL (ref 80.0–100.0)
Platelets: UNDETERMINED 10*3/uL (ref 150–400)
RBC: 4.87 MIL/uL (ref 4.22–5.81)
RDW: 12.1 % (ref 11.5–15.5)
WBC: 5.6 10*3/uL (ref 4.0–10.5)
nRBC: 0 % (ref 0.0–0.2)

## 2019-04-21 LAB — BASIC METABOLIC PANEL
Anion gap: 13 (ref 5–15)
BUN: 19 mg/dL (ref 6–20)
CO2: 22 mmol/L (ref 22–32)
Calcium: 9.4 mg/dL (ref 8.9–10.3)
Chloride: 91 mmol/L — ABNORMAL LOW (ref 98–111)
Creatinine, Ser: 2.1 mg/dL — ABNORMAL HIGH (ref 0.61–1.24)
GFR calc Af Amer: 41 mL/min — ABNORMAL LOW (ref >60)
GFR calc non Af Amer: 35 mL/min — ABNORMAL LOW (ref >60)
Glucose, Bld: 659 mg/dL (ref 70–99)
Potassium: 4.7 mmol/L (ref 3.5–5.1)
Sodium: 126 mmol/L — ABNORMAL LOW (ref 135–145)

## 2019-04-21 LAB — CBG MONITORING, ED: Glucose-Capillary: >600 mg/dL (ref 70–99)

## 2019-04-21 MED ORDER — SODIUM CHLORIDE 0.9% FLUSH: 3.0000 mL | Freq: Once | INTRAVENOUS | Status: DC

## 2019-04-21 NOTE — ED Notes (Signed)
Karen notified

## 2019-04-21 NOTE — ED Notes (Signed)
No answer x2 for vitals recheck

## 2019-04-21 NOTE — ED Triage Notes (Signed)
Glucose 659

## 2019-04-21 NOTE — ED Triage Notes (Signed)
Pt states he felt lightheaded and fell this morning.  Unknown LOC.  States hole in wall where he hit his head.  Reports neck pain.  C/o L ankle and L knee pain.  Pt ambulatory with cane.

## 2019-04-22 ENCOUNTER — Emergency Department (HOSPITAL_COMMUNITY): Payer: Medicare HMO

## 2019-04-22 ENCOUNTER — Emergency Department (HOSPITAL_COMMUNITY)
Admission: EM | Admit: 2019-04-22 | Discharge: 2019-04-22 | Disposition: A | Discharge status: Home / Self Care | Payer: Medicare HMO | Attending: Emergency Medicine | Admitting: Emergency Medicine

## 2019-04-22 ENCOUNTER — Encounter (HOSPITAL_COMMUNITY): Payer: Self-pay

## 2019-04-22 ENCOUNTER — Other Ambulatory Visit: Payer: Self-pay

## 2019-04-22 DIAGNOSIS — E1165 Type 2 diabetes mellitus with hyperglycemia: Secondary | ICD-10-CM | POA: Diagnosis not present

## 2019-04-22 DIAGNOSIS — Z87891 Personal history of nicotine dependence: Secondary | ICD-10-CM | POA: Insufficient documentation

## 2019-04-22 DIAGNOSIS — E1122 Type 2 diabetes mellitus with diabetic chronic kidney disease: Secondary | ICD-10-CM | POA: Diagnosis not present

## 2019-04-22 DIAGNOSIS — F0390 Unspecified dementia without behavioral disturbance: Secondary | ICD-10-CM | POA: Insufficient documentation

## 2019-04-22 DIAGNOSIS — Z794 Long term (current) use of insulin: Secondary | ICD-10-CM | POA: Diagnosis not present

## 2019-04-22 DIAGNOSIS — Z96643 Presence of artificial hip joint, bilateral: Secondary | ICD-10-CM | POA: Diagnosis not present

## 2019-04-22 DIAGNOSIS — R55 Syncope and collapse: Secondary | ICD-10-CM | POA: Insufficient documentation

## 2019-04-22 DIAGNOSIS — M542 Cervicalgia: Secondary | ICD-10-CM | POA: Insufficient documentation

## 2019-04-22 DIAGNOSIS — I129 Hypertensive chronic kidney disease with stage 1 through stage 4 chronic kidney disease, or unspecified chronic kidney disease: Secondary | ICD-10-CM | POA: Insufficient documentation

## 2019-04-22 DIAGNOSIS — R109 Unspecified abdominal pain: Secondary | ICD-10-CM | POA: Insufficient documentation

## 2019-04-22 DIAGNOSIS — R42 Dizziness and giddiness: Secondary | ICD-10-CM | POA: Diagnosis not present

## 2019-04-22 DIAGNOSIS — W0110XA Fall on same level from slipping, tripping and stumbling with subsequent striking against unspecified object, initial encounter: Secondary | ICD-10-CM | POA: Diagnosis not present

## 2019-04-22 DIAGNOSIS — S0990XA Unspecified injury of head, initial encounter: Secondary | ICD-10-CM | POA: Diagnosis not present

## 2019-04-22 DIAGNOSIS — R519 Headache, unspecified: Secondary | ICD-10-CM | POA: Diagnosis not present

## 2019-04-22 DIAGNOSIS — Z79899 Other long term (current) drug therapy: Secondary | ICD-10-CM | POA: Insufficient documentation

## 2019-04-22 DIAGNOSIS — R531 Weakness: Secondary | ICD-10-CM | POA: Diagnosis not present

## 2019-04-22 DIAGNOSIS — S199XXA Unspecified injury of neck, initial encounter: Secondary | ICD-10-CM | POA: Diagnosis not present

## 2019-04-22 DIAGNOSIS — R739 Hyperglycemia, unspecified: Secondary | ICD-10-CM

## 2019-04-22 DIAGNOSIS — W19XXXA Unspecified fall, initial encounter: Secondary | ICD-10-CM | POA: Diagnosis not present

## 2019-04-22 DIAGNOSIS — N183 Chronic kidney disease, stage 3 unspecified: Secondary | ICD-10-CM | POA: Insufficient documentation

## 2019-04-22 LAB — COMPREHENSIVE METABOLIC PANEL
ALT: 14 U/L (ref 0–44)
AST: 18 U/L (ref 15–41)
Albumin: 3.6 g/dL (ref 3.5–5.0)
Alkaline Phosphatase: 82 U/L (ref 38–126)
Anion gap: 13 (ref 5–15)
BUN: 18 mg/dL (ref 6–20)
CO2: 25 mmol/L (ref 22–32)
Calcium: 9.3 mg/dL (ref 8.9–10.3)
Chloride: 92 mmol/L — ABNORMAL LOW (ref 98–111)
Creatinine, Ser: 1.96 mg/dL — ABNORMAL HIGH (ref 0.61–1.24)
GFR calc Af Amer: 45 mL/min — ABNORMAL LOW (ref >60)
GFR calc non Af Amer: 38 mL/min — ABNORMAL LOW (ref >60)
Glucose, Bld: 401 mg/dL — ABNORMAL HIGH (ref 70–99)
Potassium: 4.3 mmol/L (ref 3.5–5.1)
Sodium: 130 mmol/L — ABNORMAL LOW (ref 135–145)
Total Bilirubin: 1.1 mg/dL (ref 0.3–1.2)
Total Protein: 6.9 g/dL (ref 6.5–8.1)

## 2019-04-22 LAB — URINALYSIS, ROUTINE W REFLEX MICROSCOPIC
Bacteria, UA: NONE SEEN
Bilirubin Urine: NEGATIVE
Glucose, UA: >=500 mg/dL — AB
Ketones, ur: 5 mg/dL — AB
Leukocytes,Ua: NEGATIVE
Nitrite: NEGATIVE
Protein, ur: NEGATIVE mg/dL
Specific Gravity, Urine: 1.025 (ref 1.005–1.030)
pH: 6 (ref 5.0–8.0)

## 2019-04-22 LAB — CBC WITH DIFFERENTIAL/PLATELET
Abs Immature Granulocytes: 0.08 10*3/uL — ABNORMAL HIGH (ref 0.00–0.07)
Basophils Absolute: 0 10*3/uL (ref 0.0–0.1)
Basophils Relative: 0 %
Eosinophils Absolute: 0 10*3/uL (ref 0.0–0.5)
Eosinophils Relative: 0 %
HCT: 42.1 % (ref 39.0–52.0)
Hemoglobin: 14.7 g/dL (ref 13.0–17.0)
Immature Granulocytes: 1 %
Lymphocytes Relative: 20 %
Lymphs Abs: 1.9 10*3/uL (ref 0.7–4.0)
MCH: 30.4 pg (ref 26.0–34.0)
MCHC: 34.9 g/dL (ref 30.0–36.0)
MCV: 87 fL (ref 80.0–100.0)
Monocytes Absolute: 1.2 10*3/uL — ABNORMAL HIGH (ref 0.1–1.0)
Monocytes Relative: 13 %
Neutro Abs: 6.3 10*3/uL (ref 1.7–7.7)
Neutrophils Relative %: 66 %
Platelets: 112 10*3/uL — ABNORMAL LOW (ref 150–400)
RBC: 4.84 MIL/uL (ref 4.22–5.81)
RDW: 12.1 % (ref 11.5–15.5)
WBC: 9.5 10*3/uL (ref 4.0–10.5)
nRBC: 0 % (ref 0.0–0.2)

## 2019-04-22 LAB — RAPID URINE DRUG SCREEN, HOSP PERFORMED
Amphetamines: NOT DETECTED
Barbiturates: NOT DETECTED
Benzodiazepines: NOT DETECTED
Cocaine: NOT DETECTED
Opiates: NOT DETECTED
Tetrahydrocannabinol: NOT DETECTED

## 2019-04-22 LAB — CBG MONITORING, ED: Glucose-Capillary: 351 mg/dL — ABNORMAL HIGH (ref 70–99)

## 2019-04-22 LAB — LIPASE, BLOOD: Lipase: 93 U/L — ABNORMAL HIGH (ref 11–51)

## 2019-04-22 LAB — BETA-HYDROXYBUTYRIC ACID: Beta-Hydroxybutyric Acid: 0.16 mmol/L (ref 0.05–0.27)

## 2019-04-22 LAB — TROPONIN I (HIGH SENSITIVITY): Troponin I (High Sensitivity): 5 ng/L (ref <18)

## 2019-04-22 MED ORDER — INSULIN ASPART 100 UNIT/ML ~~LOC~~ SOLN
10.0000 [IU] | Freq: Once | SUBCUTANEOUS | Status: AC
  Administered 2019-04-22: 06:00:00 10 [IU] via SUBCUTANEOUS

## 2019-04-22 MED ORDER — SODIUM CHLORIDE 0.9 % IV BOLUS
1000.0000 mL | Freq: Once | INTRAVENOUS | Status: AC
  Administered 2019-04-22: 03:00:00 1000 mL via INTRAVENOUS

## 2019-04-22 NOTE — ED Notes (Signed)
Pt transported to CT in wheelchair. Pt would not ride to CT on stretcher.

## 2019-04-22 NOTE — ED Triage Notes (Signed)
Pt BIB [facility] from home C/O syncopal episodes. Pt was seen here for the first syncopal episode that happened yesterday 12/31. Pt was discharged, went home felt lightheaded again and had another syncopal episode. EMS stated pt was orthostatic positive when they arrived. Pt is on blood thinners. Pt is complaining of dizziness but states he never lost consciousness during the episodes.

## 2019-04-22 NOTE — ED Notes (Signed)
Patient verbalizes understanding of discharge instructions. Opportunity for questioning and answers were provided. Armband removed by staff, pt discharged from ED. Wheeled out to lobby  

## 2019-04-22 NOTE — ED Notes (Signed)
Pt again, unhooked from monitoring cords, sitting at end of bed and anxious. Pt states he wants to go home if all checks out with his visit, will notify provider.

## 2019-04-22 NOTE — ED Notes (Signed)
Pt took leads and C-Collar off. This nurse advised pt to keep C-Collar on until he was evaluated and was assure there was nothing wrong with his neck from his fall. Pt refused to listened and continued taking leads and C-Collar off. Pt then stood up and peed in the trash can.

## 2019-04-22 NOTE — ED Provider Notes (Addendum)
[facility] DEPART[facility] Provider Note   CSN: [id number] Arrival date & time: 04/22/19  0138     History Chief Complaint  Patient presents with  . Loss of Consciousness    [name] is a 54 y.o. male.  Patient presents to the emergency department for syncope.  Patient reports that he has passed out twice today.  He passed out around 930 this morning.  He came to the ER to be evaluated at some point today but waited in the waiting room only 3 hours and then left before being seen.  He had a second episode tonight.  He reports both episodes occurred after he stood up he became acutely dizzy.  He did fall and hit his head.  He initially reported that he is on blood thinners but we do not have records of him being on blood thinners.  He is a poor historian and does not know all of his medications and likely has been noncompliant with his regimen.  He was markedly hyperglycemic earlier before he left AMA.         Past Medical History:  Diagnosis Date  . ADD (attention deficit disorder)   . Anxiety   . Arthritis    right hip  . Bipolar 1 disorder ([place])   . Blood in urine   . CKD (chronic kidney disease), stage III   . Creatinine elevation   . Dementia ([place])    "early onset" (08/04/2017)  . Depression    bipolar [facility]  . Diabetes mellitus without complication ([place])   . Family history of anesthesia complication    pt is unsure , but pt father may have been difficult to arouse   . HCAP (healthcare-associated pneumonia) 10/31/2012  . History of kidney stones   . Hypertension   . Hypogonadism male   . Liver fatty degeneration   . Microscopic hematuria    hereditary s/p Urology eval  . Osteoarthritis of right hip 11/28/2011   2012 2015 s/p THR Severe  Dr Novella Olive    . Pleural effusion 11/02/2012  . Pneumonia 10-2012  . Pneumonia, organism unspecified(486) 11/02/2012  . Polysubstance dependence, non-opioid, in remission ([place])    remote  .  Primary osteoarthritis of left hip 05/22/2015  . PTSD (post-traumatic stress disorder)    SOCIAL ANXIETY DISORDER   . Suicide attempt by multiple drug overdose ([place]) 01-17-2016   Grieving his cat's death 08-03-15    Patient Active Problem List   Diagnosis Date Noted  . Closed displaced fracture of lateral malleolus of left fibula 08/02/2017  . BPH (benign prostatic hyperplasia) 07/24/2017  . Hemorrhoid 05/27/2017  . Shoulder arthritis 05/27/2017  . Diabetic neuropathy ([place]) 05/27/2017  . Degenerative disc disease, lumbar 02/04/2017  . Constipation   . History of seizures 09/20/2016  . Dementia, early onset with advanced brain atrophy for age 100/05/2016  . Acute lower UTI   . Urinary retention   . Acute metabolic encephalopathy 67/61/9509  . HLD (hyperlipidemia) 09/07/2016  . GERD (gastroesophageal reflux disease) 09/07/2016  . PTSD (post-traumatic stress disorder) 09/07/2016  . Acute encephalopathy 09/07/2016  . Fall 09/07/2016  . Chronic kidney disease   . Ataxia 10/14/2015  . Hypokalemia 10/03/2015  . Hemiparesis ([place])   . Dyslipidemia 05/31/2014  . Insomnia 11/11/2013  . Degenerative joint disease (DJD) of hip 08/16/2013  . Erectile dysfunction 02/17/2011  . Constipation - functional 02/17/2011  . Diabetes type 2, uncontrolled ([place]) 11/06/2010  . Hypogonadism male 05/20/2010  .  TOBACCO USER 05/20/2010  . Demoralization and apathy 05/20/2010  . Schizoaffective disorder, bipolar type ([place]) 01/18/2010  . Right shoulder pain 01/18/2010  . Anxiety state 12/05/2006  . Essential hypertension 12/05/2006    Past Surgical History:  Procedure Laterality Date  . BACK SURGERY    . CLOSED REDUCTION METACARPAL WITH PERCUTANEOUS PINNING Right   . LUMBAR DISC SURGERY    . TONSILLECTOMY    . TOTAL HIP ARTHROPLASTY Right 08/16/2013   Procedure: TOTAL HIP ARTHROPLASTY ANTERIOR APPROACH;  Surgeon: Hessie Dibble, MD;  Location: [facility];  Service: Orthopedics;  Laterality: Right;  . TOTAL  HIP ARTHROPLASTY Left 05/22/2015   Procedure: TOTAL HIP ARTHROPLASTY ANTERIOR APPROACH;  Surgeon: Melrose Nakayama, MD;  Location: [facility];  Service: Orthopedics;  Laterality: Left;       Family History  Problem Relation Age of Onset  . Diabetes Father   . Cancer Mother        died of melanoma with mets  . Cervical cancer Sister   . Diabetes Sister   . Other Neg Hx        hypogonadism    Social History   Tobacco Use  . Smoking status: Former Smoker    Years: 0.00    Types: Cigarettes    Quit date: 02/19/2014    Years since quitting: 5.1  . Smokeless tobacco: Never Used  Substance Use Topics  . Alcohol use: No  . Drug use: No    Comment: hx of marijuana/cocaine/crack use but sober since 20's    Home Medications Prior to Admission medications   Medication Sig Start Date End Date Taking? Authorizing Provider  acetaminophen (TYLENOL) 325 MG tablet Take 2 tablets (650 mg total) by mouth every 6 (six) hours as needed for mild pain (or Fever >/= 101). 08/11/17   Elgergawy, Silver Huguenin, MD  ARIPiprazole (ABILIFY) 5 MG tablet TAKE 1 TABLET BY MOUTH DAILY FOR DEPRESSION Patient taking differently: Take 5 mg by mouth daily.  06/23/18   [name], MD  atorvastatin (LIPITOR) 10 MG tablet TAKE 1 TABLET(10 MG) BY MOUTH DAILY 04/04/19   [name], MD  B-D UF III MINI PEN NEEDLES 31G X 5 MM MISC USE FOUR TIMES DAILY 04/05/19   [name], MD  bacitracin ointment Apply 1 application topically 3 (three) times daily. Apply to right fifth toe Patient not taking: Reported on 07/20/2018 08/11/17   Elgergawy, Silver Huguenin, MD  buPROPion (WELLBUTRIN XL) 300 MG 24 hr tablet Take 1 tablet (300 mg total) by mouth daily. 10/13/16   Elgergawy, Silver Huguenin, MD  CHANTIX 1 MG tablet TAKE 1 TABLET(1 MG) BY MOUTH TWICE DAILY 02/28/19   [name], MD  cyanocobalamin 500 MCG tablet Take 1 tablet (500 mcg total) by mouth daily. Patient not taking: Reported on 07/20/2018 10/13/16   Elgergawy, Silver Huguenin, MD    diclofenac sodium (VOLTAREN) 1 % GEL Apply 4 g topically 4 (four) times daily. Patient not taking: Reported on 07/20/2018 02/04/17   [name], MD  divalproex (DEPAKOTE) 500 MG DR tablet Take 3 tablets (1,500 mg total) by mouth at bedtime. 10/13/16 07/20/18  Elgergawy, Silver Huguenin, MD  feeding supplement, ENSURE ENLIVE, (ENSURE ENLIVE) LIQD Take 237 mLs by mouth 2 (two) times daily between meals. 08/11/17   Elgergawy, Silver Huguenin, MD  fluticasone (FLONASE) 50 MCG/ACT nasal spray SHAKE LIQUID AND USE 2 SPRAYS IN EACH NOSTRIL DAILY 10/25/18   [name], MD  gabapentin (NEURONTIN) 300 MG capsule Take 1 capsule (300 mg  total) by mouth 3 (three) times daily. 02/21/19   [name], MD  glucose blood (ACCU-CHEK GUIDE) test strip CHECK SUGAR FOUR TIMES DAILY 07/22/18   [name], MD  insulin aspart (NOVOLOG FLEXPEN) 100 UNIT/ML FlexPen 0 to 12 units subcutaneously 3 times daily as per sliding scale 07/23/18   [name], MD  Insulin Detemir (LEVEMIR FLEXTOUCH) 100 UNIT/ML Pen Inject twice daily subcutaneously 38units in the morning and 38 units in the evening 02/21/19   [name], MD  lidocaine (LIDODERM) 5 % Place 1 patch onto the skin daily. Remove & Discard patch within 12 hours or as directed by MD Patient not taking: Reported on 07/20/2018 05/12/18   [name], MD  linagliptin (TRADJENTA) 5 MG TABS tablet Take 1 tablet (5 mg total) by mouth daily. 02/21/19   [name], MD  lisinopril-hydrochlorothiazide (ZESTORETIC) 10-12.5 MG tablet Take 1 tablet by mouth daily. 02/21/19   [name], MD  methocarbamol (ROBAXIN) 500 MG tablet Take 2 tablets (1,000 mg total) by mouth 4 (four) times daily. 02/21/19   [name], MD  omeprazole (PRILOSEC) 40 MG capsule TAKE 1 CAPSULE(40 MG) BY MOUTH DAILY 03/03/19   [name], MD  predniSONE (DELTASONE) 50 MG tablet Take 1 tablet (50 mg total) by mouth daily with breakfast. 03/23/19   Charlann Lange, PA-C  QUEtiapine (SEROQUEL) 400  MG tablet Take 1 tablet (400 mg total) by mouth at bedtime. 10/13/16   Elgergawy, Silver Huguenin, MD  tadalafil (CIALIS) 5 MG tablet TAKE ONE TABLET BY MOUTH EVERY DAY FOR BPH 02/21/19   [name], MD  Testosterone (ANDROGEL) 20.25 MG/1.25GM (1.62%) GEL Apply 20.25 mg topically every morning. Apply topically under arm pit alternating with each application. 02/21/19   [name], MD  tiZANidine (ZANAFLEX) 4 MG tablet Take 1 tablet (4 mg total) by mouth every 8 (eight) hours as needed for muscle spasms. 03/23/19   Charlann Lange, PA-C  traMADol (ULTRAM) 50 MG tablet Take 1 tablet (50 mg total) by mouth at bedtime. 02/21/19   [name], MD  traZODone (DESYREL) 150 MG tablet Take 1 tablet (150 mg total) by mouth at bedtime. 10/13/16   Elgergawy, Silver Huguenin, MD    Allergies    Vicodin [hydrocodone-acetaminophen]  Review of Systems   Review of Systems  Neurological: Positive for dizziness and syncope.  All other systems reviewed and are negative.   Physical Exam Updated Vital Signs BP 119/71   Pulse 76   Temp 98 F (36.7 C) (Oral)   Resp 14   Ht 6\' 5"  (1.956 m)   Wt 104.3 kg   SpO2 96%   BMI 27.27 kg/m   Physical Exam Vitals and nursing note reviewed.  Constitutional:      General: He is not in acute distress.    Appearance: Normal appearance. He is well-developed.  HENT:     Head: Normocephalic.     Right Ear: Hearing normal.     Left Ear: Hearing normal.     Nose: Nose normal.  Eyes:     Conjunctiva/sclera: Conjunctivae normal.     Pupils: Pupils are equal, round, and reactive to light.  Cardiovascular:     Rate and Rhythm: Regular rhythm.     Heart sounds: S1 normal and S2 normal. No murmur. No friction rub. No gallop.   Pulmonary:     Effort: Pulmonary effort is normal. No respiratory distress.     Breath sounds: Normal breath sounds.  Chest:     Chest wall: No  tenderness.  Abdominal:     General: Bowel sounds are normal.     Palpations: Abdomen is soft.      Tenderness: There is no abdominal tenderness. There is no guarding or rebound. Negative signs include Murphy's sign and McBurney's sign.     Hernia: No hernia is present.  Musculoskeletal:        General: Normal range of motion.     Cervical back: Normal range of motion and neck supple.  Skin:    General: Skin is warm and dry.     Findings: No rash.  Neurological:     Mental Status: He is alert and oriented to person, place, and time.     GCS: GCS eye subscore is 4. GCS verbal subscore is 5. GCS motor subscore is 6.     Cranial Nerves: No cranial nerve deficit.     Sensory: No sensory deficit.     Coordination: Coordination normal.  Psychiatric:        Speech: Speech normal.        Behavior: Behavior normal.        Thought Content: Thought content normal.     ED Results / Procedures / Treatments   Labs (all labs ordered are listed, but only abnormal results are displayed) Labs Reviewed  CBC WITH DIFFERENTIAL/PLATELET - Abnormal; Notable for the following components:      Result Value   Platelets 112 (*)    Monocytes Absolute 1.2 (*)    Abs Immature Granulocytes 0.08 (*)    All other components within normal limits  COMPREHENSIVE METABOLIC PANEL - Abnormal; Notable for the following components:   Sodium 130 (*)    Chloride 92 (*)    Glucose, Bld 401 (*)    Creatinine, Ser 1.96 (*)    GFR calc non Af Amer 38 (*)    GFR calc Af Amer 45 (*)    All other components within normal limits  LIPASE, BLOOD - Abnormal; Notable for the following components:   Lipase 93 (*)    All other components within normal limits  URINALYSIS, ROUTINE W REFLEX MICROSCOPIC - Abnormal; Notable for the following components:   Color, Urine STRAW (*)    Glucose, UA >=500 (*)    Hgb urine dipstick SMALL (*)    Ketones, ur 5 (*)    All other components within normal limits  CBG MONITORING, ED - Abnormal; Notable for the following components:   Glucose-Capillary 351 (*)    All other components within  normal limits  BETA-HYDROXYBUTYRIC ACID  RAPID URINE DRUG SCREEN, HOSP PERFORMED  CBG MONITORING, ED  TROPONIN I (HIGH SENSITIVITY)  TROPONIN I (HIGH SENSITIVITY)    EKG EKG Interpretation  Date/Time:  Friday April 22 2019 01:40:26 EST Ventricular Rate:  81 PR Interval:    QRS Duration: 96 QT Interval:  363 QTC Calculation: 422 R Axis:   61 Text Interpretation: Sinus rhythm Normal ECG Confirmed by Orpah Greek [phone]) on 04/22/2019 2:02:17 AM   Radiology CT Head Wo Contrast  Result Date: 04/22/2019 CLINICAL DATA:  Head trauma, headache. Syncope. EXAM: CT HEAD WITHOUT CONTRAST TECHNIQUE: Contiguous axial images were obtained from the base of the skull through the vertex without intravenous contrast. COMPARISON:  Most recent comparison head CT 09/18/2018 FINDINGS: Brain: No evidence of acute infarction, hemorrhage, hydrocephalus, extra-axial collection or mass lesion/mass effect. Stable atrophy. Vascular: No hyperdense vessel. Tortuous left vertebral artery again seen. Skull: No fracture or focal lesion. Sinuses/Orbits: No acute findings. Mild mucosal  thickening of left maxillary sinus. Sinuses are otherwise clear. Included orbits are unremarkable. Other: None. IMPRESSION: No acute intracranial abnormality.  Stable generalized atrophy. Electronically Signed   By: Keith Rake M.D.   On: 04/22/2019 03:13   CT Cervical Spine Wo Contrast  Result Date: 04/22/2019 CLINICAL DATA:  Syncope. Neck trauma, blunt EXAM: CT CERVICAL SPINE WITHOUT CONTRAST TECHNIQUE: Multidetector CT imaging of the cervical spine was performed without intravenous contrast. Multiplanar CT image reconstructions were also generated. COMPARISON:  Most recent cervical spine CT 09/18/2018 FINDINGS: Alignment: Stable. Mild widening of C6-C7 disc space is unchanged. No traumatic subluxation. Skull base and vertebrae: No acute fracture. Vertebral body heights are maintained. The dens and skull base are intact. Bulky  posterior osteophytes at C5-C6, unchanged. Additional anterior spurring at C4-C5, C3-C4 and C6-C7. Soft tissues and spinal canal: No prevertebral fluid or swelling. No visible canal hematoma. Disc levels: Posterior osteophytes at C5-C6 causes mild narrowing of the spinal canal. Additional endplate spurring with anterior osteophyte C3-C4 through C6-C7. Upper chest: No acute findings. Mild emphysema. Other: None. IMPRESSION: 1. No acute fracture or subluxation of the cervical spine. 2. Multilevel degenerative change with anterior and posterior osteophytes. Posterior osteophytes at C5-C6 cause narrowing of the spinal canal, unchanged from previous. Electronically Signed   By: Keith Rake M.D.   On: 04/22/2019 03:17    Procedures Procedures (including critical care time)  Medications Ordered in ED Medications  sodium chloride 0.9 % bolus 1,000 mL (0 mLs Intravenous Stopped 04/22/19 0400)  insulin aspart (novoLOG) injection 10 Units (10 Units Subcutaneous Given 04/22/19 0540)    ED Course  I have reviewed the triage vital signs and the nursing notes.  Pertinent labs & imaging results that were available during my care of the patient were reviewed by me and considered in my medical decision making (see chart for details).    MDM Rules/Calculators/A&P                      Patient presents to the emergency department for evaluation of syncope.  Patient reports 2 episodes today.  He came to the ER earlier this afternoon to be evaluated but did not wait long enough to be seen.  He has had a second episode now and returns to the ER by ambulance.  Patient does report hitting his head.  He reports headache, neck pain, abdominal pain at arrival.  CT head and cervical spine performed, no acute abnormality.  Patient was noted to have a glucose over 600 with his initial triage.  He was found to have a glucose of 401 during this visit.  No acidosis, no ketosis.  Patient administered IV fluids, as it is felt that  his falls today are secondary to dehydration and orthostatic hypotension.  Patient feeling better after fluids.  Blood sugar slowly coming down.  Repeat sugar after fluids 351.  Patient administered subcu insulin.  No evidence of cardiac illness.  EKG unremarkable, troponin normal.  Patient did have a slightly elevated lipase.  He denies alcohol abuse.  I do not see any history of alcoholism in his records but he does have a history of polysubstance abuse.  Drug screen negative today.  I did reexamine the patient.  He is sitting up comfortably in the bed and he is not complaining of any abdominal pain.  Abdominal exam at this time is benign, nontender.  I therefore do not feel he requires imaging.  As his hyperglycemia is improving and he  does not have diabetic ketoacidosis he is appropriate for discharge. Final Clinical Impression(s) / ED Diagnoses Final diagnoses:  Syncope, unspecified syncope type  Hyperglycemia    Rx / DC Orders ED Discharge Orders    None       Rayya Yagi, Gwenyth Allegra, MD 04/22/19 1594    Orpah Greek, MD 05/13/19 [phone]

## 2019-04-22 NOTE — ED Notes (Signed)
Arrived to room and pt had removed all monitoring wires and put his clothes back on. Placed back on BP cuff and heart monitor.

## 2019-04-23 ENCOUNTER — Other Ambulatory Visit: Payer: Self-pay

## 2019-04-23 ENCOUNTER — Emergency Department (HOSPITAL_COMMUNITY)
Admission: EM | Admit: 2019-04-23 | Discharge: 2019-04-23 | Disposition: A | Discharge status: Home / Self Care | Payer: Medicare HMO | Attending: Emergency Medicine | Admitting: Emergency Medicine

## 2019-04-23 ENCOUNTER — Encounter (HOSPITAL_COMMUNITY): Payer: Self-pay | Admitting: Emergency Medicine

## 2019-04-23 ENCOUNTER — Emergency Department (HOSPITAL_COMMUNITY): Payer: Medicare HMO

## 2019-04-23 DIAGNOSIS — Y999 Unspecified external cause status: Secondary | ICD-10-CM | POA: Diagnosis not present

## 2019-04-23 DIAGNOSIS — Y92003 Bedroom of unspecified non-institutional (private) residence as the place of occurrence of the external cause: Secondary | ICD-10-CM | POA: Diagnosis not present

## 2019-04-23 DIAGNOSIS — I1310 Hypertensive heart and chronic kidney disease without heart failure, with stage 1 through stage 4 chronic kidney disease, or unspecified chronic kidney disease: Secondary | ICD-10-CM | POA: Insufficient documentation

## 2019-04-23 DIAGNOSIS — S92591A Other fracture of right lesser toe(s), initial encounter for closed fracture: Secondary | ICD-10-CM | POA: Diagnosis not present

## 2019-04-23 DIAGNOSIS — R58 Hemorrhage, not elsewhere classified: Secondary | ICD-10-CM | POA: Diagnosis not present

## 2019-04-23 DIAGNOSIS — S92504A Nondisplaced unspecified fracture of right lesser toe(s), initial encounter for closed fracture: Secondary | ICD-10-CM

## 2019-04-23 DIAGNOSIS — R0902 Hypoxemia: Secondary | ICD-10-CM | POA: Diagnosis not present

## 2019-04-23 DIAGNOSIS — E1165 Type 2 diabetes mellitus with hyperglycemia: Secondary | ICD-10-CM | POA: Diagnosis not present

## 2019-04-23 DIAGNOSIS — M545 Low back pain: Secondary | ICD-10-CM | POA: Diagnosis not present

## 2019-04-23 DIAGNOSIS — E1122 Type 2 diabetes mellitus with diabetic chronic kidney disease: Secondary | ICD-10-CM | POA: Insufficient documentation

## 2019-04-23 DIAGNOSIS — N189 Chronic kidney disease, unspecified: Secondary | ICD-10-CM | POA: Insufficient documentation

## 2019-04-23 DIAGNOSIS — Z87891 Personal history of nicotine dependence: Secondary | ICD-10-CM | POA: Insufficient documentation

## 2019-04-23 DIAGNOSIS — Z794 Long term (current) use of insulin: Secondary | ICD-10-CM | POA: Diagnosis not present

## 2019-04-23 DIAGNOSIS — E1140 Type 2 diabetes mellitus with diabetic neuropathy, unspecified: Secondary | ICD-10-CM | POA: Insufficient documentation

## 2019-04-23 DIAGNOSIS — Z79899 Other long term (current) drug therapy: Secondary | ICD-10-CM | POA: Diagnosis not present

## 2019-04-23 DIAGNOSIS — S99921A Unspecified injury of right foot, initial encounter: Secondary | ICD-10-CM | POA: Diagnosis present

## 2019-04-23 DIAGNOSIS — S91311A Laceration without foreign body, right foot, initial encounter: Secondary | ICD-10-CM | POA: Diagnosis not present

## 2019-04-23 DIAGNOSIS — M25552 Pain in left hip: Secondary | ICD-10-CM | POA: Diagnosis not present

## 2019-04-23 DIAGNOSIS — R29898 Other symptoms and signs involving the musculoskeletal system: Secondary | ICD-10-CM | POA: Diagnosis not present

## 2019-04-23 DIAGNOSIS — Y9389 Activity, other specified: Secondary | ICD-10-CM | POA: Insufficient documentation

## 2019-04-23 DIAGNOSIS — R000 Tachycardia, unspecified: Secondary | ICD-10-CM | POA: Diagnosis not present

## 2019-04-23 DIAGNOSIS — W2209XA Striking against other stationary object, initial encounter: Secondary | ICD-10-CM | POA: Diagnosis not present

## 2019-04-23 DIAGNOSIS — S92514A Nondisplaced fracture of proximal phalanx of right lesser toe(s), initial encounter for closed fracture: Secondary | ICD-10-CM | POA: Diagnosis not present

## 2019-04-23 DIAGNOSIS — S79912A Unspecified injury of left hip, initial encounter: Secondary | ICD-10-CM | POA: Diagnosis not present

## 2019-04-23 LAB — CBC
HCT: 41.6 % (ref 39.0–52.0)
Hemoglobin: 14.7 g/dL (ref 13.0–17.0)
MCH: 30.4 pg (ref 26.0–34.0)
MCHC: 35.3 g/dL (ref 30.0–36.0)
MCV: 86 fL (ref 80.0–100.0)
Platelets: 110 10*3/uL — ABNORMAL LOW (ref 150–400)
RBC: 4.84 MIL/uL (ref 4.22–5.81)
RDW: 12.1 % (ref 11.5–15.5)
WBC: 8 10*3/uL (ref 4.0–10.5)
nRBC: 0 % (ref 0.0–0.2)

## 2019-04-23 LAB — URINALYSIS, ROUTINE W REFLEX MICROSCOPIC
Bacteria, UA: NONE SEEN
Bilirubin Urine: NEGATIVE
Glucose, UA: >=500 mg/dL — AB
Ketones, ur: 20 mg/dL — AB
Leukocytes,Ua: NEGATIVE
Nitrite: NEGATIVE
Protein, ur: 30 mg/dL — AB
Specific Gravity, Urine: 1.031 — ABNORMAL HIGH (ref 1.005–1.030)
pH: 5 (ref 5.0–8.0)

## 2019-04-23 LAB — BASIC METABOLIC PANEL
Anion gap: 12 (ref 5–15)
BUN: 25 mg/dL — ABNORMAL HIGH (ref 6–20)
CO2: 20 mmol/L — ABNORMAL LOW (ref 22–32)
Calcium: 9.6 mg/dL (ref 8.9–10.3)
Chloride: 96 mmol/L — ABNORMAL LOW (ref 98–111)
Creatinine, Ser: 1.93 mg/dL — ABNORMAL HIGH (ref 0.61–1.24)
GFR calc Af Amer: 45 mL/min — ABNORMAL LOW (ref >60)
GFR calc non Af Amer: 39 mL/min — ABNORMAL LOW (ref >60)
Glucose, Bld: 400 mg/dL — ABNORMAL HIGH (ref 70–99)
Potassium: 4.8 mmol/L (ref 3.5–5.1)
Sodium: 128 mmol/L — ABNORMAL LOW (ref 135–145)

## 2019-04-23 MED ORDER — INSULIN ASPART 100 UNIT/ML ~~LOC~~ SOLN
10.0000 [IU] | Freq: Once | SUBCUTANEOUS | Status: AC
  Administered 2019-04-23: 10 [IU] via INTRAVENOUS

## 2019-04-23 MED ORDER — SODIUM CHLORIDE 0.9% FLUSH
3.0000 mL | Freq: Once | INTRAVENOUS | Status: AC
  Administered 2019-04-23: 3 mL via INTRAVENOUS

## 2019-04-23 MED ORDER — SODIUM CHLORIDE 0.9 % IV BOLUS
1000.0000 mL | Freq: Once | INTRAVENOUS | Status: AC
  Administered 2019-04-23: 14:00:00 1000 mL via INTRAVENOUS

## 2019-04-23 MED ORDER — TRAMADOL HCL 50 MG PO TABS
50.0000 mg | ORAL_TABLET | Freq: Once | ORAL | Status: AC
  Administered 2019-04-23: 50 mg via ORAL
  Filled 2019-04-23: qty 1

## 2019-04-23 MED ORDER — CEPHALEXIN 500 MG PO CAPS: 500.0000 mg | ORAL_CAPSULE | Freq: Two times a day (BID) | ORAL | 0 refills | Status: AC

## 2019-04-23 NOTE — ED Notes (Signed)
Patient verbalizes understanding of discharge instructions. Opportunity for questioning and answers were provided. Armband removed by staff, pt discharged from ED.  

## 2019-04-23 NOTE — ED Provider Notes (Addendum)
[facility] EMERGENCY DEPARTMENT Provider Note   CSN: [id number] Arrival date & time: 04/23/19  08/11/54     History Chief Complaint  Patient presents with  . Hyperglycemia  . Fall    [name] is a 54 y.o. male.  Patient hit his right foot and cut the bottom of his right foot on his bed.  Has history of high blood sugar.  Has high blood sugar today.  Has had frequent falls as he does have chronic tremors.  Sometimes he walks with his walker and sometimes he does not.  Did not hit his head or lose consciousness.  Was seen here last night after fall.  The history is provided by the patient.  Foot Pain This is a new problem. The current episode started 3 to 5 hours ago. The problem occurs constantly. The problem has not changed since onset.Pertinent negatives include no chest pain, no abdominal pain, no headaches and no shortness of breath. Nothing aggravates the symptoms. Nothing relieves the symptoms. He has tried nothing for the symptoms. The treatment provided no relief.       Past Medical History:  Diagnosis Date  . ADD (attention deficit disorder)   . Anxiety   . Arthritis    right hip  . Bipolar 1 disorder ([place])   . Blood in urine   . CKD (chronic kidney disease), stage III   . Creatinine elevation   . Dementia ([place])    "early onset" (08/04/2017)  . Depression    bipolar [facility]  . Diabetes mellitus without complication ([place])   . Family history of anesthesia complication    pt is unsure , but pt father may have been difficult to arouse   . HCAP (healthcare-associated pneumonia) 10/31/2012  . History of kidney stones   . Hypertension   . Hypogonadism male   . Liver fatty degeneration   . Microscopic hematuria    hereditary s/p Urology eval  . Osteoarthritis of right hip 11/28/2011   2012 2015 s/p THR Severe  Dr Novella Olive    . Pleural effusion 11/02/2012  . Pneumonia 10-2012  . Pneumonia, organism unspecified(486) 11/02/2012  . Polysubstance  dependence, non-opioid, in remission ([place])    remote  . Primary osteoarthritis of left hip 05/22/2015  . PTSD (post-traumatic stress disorder)    SOCIAL ANXIETY DISORDER   . Suicide attempt by multiple drug overdose ([place]) Jan 25, 2016   Grieving his cat's death 08/11/15    Patient Active Problem List   Diagnosis Date Noted  . Closed displaced fracture of lateral malleolus of left fibula 08/02/2017  . BPH (benign prostatic hyperplasia) 07/24/2017  . Hemorrhoid 05/27/2017  . Shoulder arthritis 05/27/2017  . Diabetic neuropathy ([place]) 05/27/2017  . Degenerative disc disease, lumbar 02/04/2017  . Constipation   . History of seizures 09/20/2016  . Dementia, early onset with advanced brain atrophy for age 37/05/2016  . Acute lower UTI   . Urinary retention   . Acute metabolic encephalopathy 19/14/7829  . HLD (hyperlipidemia) 09/07/2016  . GERD (gastroesophageal reflux disease) 09/07/2016  . PTSD (post-traumatic stress disorder) 09/07/2016  . Acute encephalopathy 09/07/2016  . Fall 09/07/2016  . Chronic kidney disease   . Ataxia 10/14/2015  . Hypokalemia 10/03/2015  . Hemiparesis ([place])   . Dyslipidemia 05/31/2014  . Insomnia 11/11/2013  . Degenerative joint disease (DJD) of hip 08/16/2013  . Erectile dysfunction 02/17/2011  . Constipation - functional 02/17/2011  . Diabetes type 2, uncontrolled ([place]) 11/06/2010  . Hypogonadism male  05/20/2010  . TOBACCO USER 05/20/2010  . Demoralization and apathy 05/20/2010  . Schizoaffective disorder, bipolar type ([place]) 01/18/2010  . Right shoulder pain 01/18/2010  . Anxiety state 12/05/2006  . Essential hypertension 12/05/2006    Past Surgical History:  Procedure Laterality Date  . BACK SURGERY    . CLOSED REDUCTION METACARPAL WITH PERCUTANEOUS PINNING Right   . LUMBAR DISC SURGERY    . TONSILLECTOMY    . TOTAL HIP ARTHROPLASTY Right 08/16/2013   Procedure: TOTAL HIP ARTHROPLASTY ANTERIOR APPROACH;  Surgeon: Hessie Dibble, MD;  Location: [facility];  Service: Orthopedics;  Laterality: Right;  . TOTAL HIP ARTHROPLASTY Left 05/22/2015   Procedure: TOTAL HIP ARTHROPLASTY ANTERIOR APPROACH;  Surgeon: Melrose Nakayama, MD;  Location: [facility];  Service: Orthopedics;  Laterality: Left;       Family History  Problem Relation Age of Onset  . Diabetes Father   . Cancer Mother        died of melanoma with mets  . Cervical cancer Sister   . Diabetes Sister   . Other Neg Hx        hypogonadism    Social History   Tobacco Use  . Smoking status: Former Smoker    Years: 0.00    Types: Cigarettes    Quit date: 02/19/2014    Years since quitting: 5.1  . Smokeless tobacco: Never Used  Substance Use Topics  . Alcohol use: No  . Drug use: No    Comment: hx of marijuana/cocaine/crack use but sober since 20's    Home Medications Prior to Admission medications   Medication Sig Start Date End Date Taking? Authorizing Provider  acetaminophen (TYLENOL) 325 MG tablet Take 2 tablets (650 mg total) by mouth every 6 (six) hours as needed for mild pain (or Fever >/= 101). 08/11/17   Elgergawy, Silver Huguenin, MD  ARIPiprazole (ABILIFY) 5 MG tablet TAKE 1 TABLET BY MOUTH DAILY FOR DEPRESSION Patient taking differently: Take 5 mg by mouth daily.  06/23/18   [name], MD  atorvastatin (LIPITOR) 10 MG tablet TAKE 1 TABLET(10 MG) BY MOUTH DAILY 04/04/19   [name], MD  B-D UF III MINI PEN NEEDLES 31G X 5 MM MISC USE FOUR TIMES DAILY 04/05/19   [name], MD  bacitracin ointment Apply 1 application topically 3 (three) times daily. Apply to right fifth toe Patient not taking: Reported on 07/20/2018 08/11/17   Elgergawy, Silver Huguenin, MD  buPROPion (WELLBUTRIN XL) 300 MG 24 hr tablet Take 1 tablet (300 mg total) by mouth daily. 10/13/16   Elgergawy, Silver Huguenin, MD  cephALEXin (KEFLEX) 500 MG capsule Take 1 capsule (500 mg total) by mouth 2 (two) times daily for 7 days. 04/23/19 04/30/19  Brylan Seubert, DO  CHANTIX 1 MG tablet TAKE 1 TABLET(1 MG) BY MOUTH  TWICE DAILY 02/28/19   [name], MD  cyanocobalamin 500 MCG tablet Take 1 tablet (500 mcg total) by mouth daily. Patient not taking: Reported on 07/20/2018 10/13/16   Elgergawy, Silver Huguenin, MD  diclofenac sodium (VOLTAREN) 1 % GEL Apply 4 g topically 4 (four) times daily. Patient not taking: Reported on 07/20/2018 02/04/17   [name], MD  divalproex (DEPAKOTE) 500 MG DR tablet Take 3 tablets (1,500 mg total) by mouth at bedtime. 10/13/16 07/20/18  Elgergawy, Silver Huguenin, MD  feeding supplement, ENSURE ENLIVE, (ENSURE ENLIVE) LIQD Take 237 mLs by mouth 2 (two) times daily between meals. 08/11/17   Elgergawy, Silver Huguenin, MD  fluticasone (FLONASE) 50 MCG/ACT  nasal spray SHAKE LIQUID AND USE 2 SPRAYS IN EACH NOSTRIL DAILY 10/25/18   [name], MD  gabapentin (NEURONTIN) 300 MG capsule Take 1 capsule (300 mg total) by mouth 3 (three) times daily. 02/21/19   [name], MD  glucose blood (ACCU-CHEK GUIDE) test strip CHECK SUGAR FOUR TIMES DAILY 07/22/18   [name], MD  insulin aspart (NOVOLOG FLEXPEN) 100 UNIT/ML FlexPen 0 to 12 units subcutaneously 3 times daily as per sliding scale 07/23/18   [name], MD  Insulin Detemir (LEVEMIR FLEXTOUCH) 100 UNIT/ML Pen Inject twice daily subcutaneously 38units in the morning and 38 units in the evening 02/21/19   [name], MD  lidocaine (LIDODERM) 5 % Place 1 patch onto the skin daily. Remove & Discard patch within 12 hours or as directed by MD Patient not taking: Reported on 07/20/2018 05/12/18   [name], MD  linagliptin (TRADJENTA) 5 MG TABS tablet Take 1 tablet (5 mg total) by mouth daily. 02/21/19   [name], MD  lisinopril-hydrochlorothiazide (ZESTORETIC) 10-12.5 MG tablet Take 1 tablet by mouth daily. 02/21/19   [name], MD  methocarbamol (ROBAXIN) 500 MG tablet Take 2 tablets (1,000 mg total) by mouth 4 (four) times daily. 02/21/19   [name], MD  omeprazole (PRILOSEC) 40 MG capsule TAKE 1 CAPSULE(40 MG) BY  MOUTH DAILY 03/03/19   [name], MD  predniSONE (DELTASONE) 50 MG tablet Take 1 tablet (50 mg total) by mouth daily with breakfast. 03/23/19   Charlann Lange, PA-C  QUEtiapine (SEROQUEL) 400 MG tablet Take 1 tablet (400 mg total) by mouth at bedtime. 10/13/16   Elgergawy, Silver Huguenin, MD  tadalafil (CIALIS) 5 MG tablet TAKE ONE TABLET BY MOUTH EVERY DAY FOR BPH 02/21/19   [name], MD  Testosterone (ANDROGEL) 20.25 MG/1.25GM (1.62%) GEL Apply 20.25 mg topically every morning. Apply topically under arm pit alternating with each application. 02/21/19   [name], MD  tiZANidine (ZANAFLEX) 4 MG tablet Take 1 tablet (4 mg total) by mouth every 8 (eight) hours as needed for muscle spasms. 03/23/19   Charlann Lange, PA-C  traMADol (ULTRAM) 50 MG tablet Take 1 tablet (50 mg total) by mouth at bedtime. 02/21/19   [name], MD  traZODone (DESYREL) 150 MG tablet Take 1 tablet (150 mg total) by mouth at bedtime. 10/13/16   Elgergawy, Silver Huguenin, MD    Allergies    Vicodin [hydrocodone-acetaminophen]  Review of Systems   Review of Systems  Constitutional: Negative for chills and fever.  HENT: Negative for ear pain and sore throat.   Eyes: Negative for pain and visual disturbance.  Respiratory: Negative for cough and shortness of breath.   Cardiovascular: Negative for chest pain and palpitations.  Gastrointestinal: Negative for abdominal pain and vomiting.  Genitourinary: Negative for dysuria and hematuria.  Musculoskeletal: Positive for arthralgias (chronic) and back pain (chronic).  Skin: Positive for wound. Negative for color change and rash.  Neurological: Positive for tremors (chronic). Negative for seizures, syncope, facial asymmetry, speech difficulty, light-headedness, numbness and headaches.  All other systems reviewed and are negative.   Physical Exam Updated Vital Signs  ED Triage Vitals  Enc Vitals Group     BP 04/23/19 1100 133/82     Pulse Rate 04/23/19 1100 (!) 112       Resp 04/23/19 1100 14     Temp 04/23/19 1100 (!) 97.4 F (36.3 C)     Temp Source 04/23/19 1100 Oral     SpO2 04/23/19 1100 95 %  Weight --      Height --      Head Circumference --      Peak Flow --      Pain Score 04/23/19 1058 10     Pain Loc --      Pain Edu? --      Excl. in [place]? --     Physical Exam Vitals and nursing note reviewed.  Constitutional:      General: He is not in acute distress.    Appearance: He is well-developed. He is not ill-appearing.  HENT:     Head: Normocephalic and atraumatic.     Nose: Nose normal.     Mouth/Throat:     Mouth: Mucous membranes are moist.  Eyes:     Extraocular Movements: Extraocular movements intact.     Conjunctiva/sclera: Conjunctivae normal.     Pupils: Pupils are equal, round, and reactive to light.  Cardiovascular:     Rate and Rhythm: Normal rate and regular rhythm.     Pulses: Normal pulses.     Heart sounds: Normal heart sounds. No murmur.  Pulmonary:     Effort: Pulmonary effort is normal. No respiratory distress.     Breath sounds: Normal breath sounds.  Abdominal:     General: Abdomen is flat.     Palpations: Abdomen is soft.     Tenderness: There is no abdominal tenderness.  Musculoskeletal:        General: No swelling or tenderness. Normal range of motion.     Cervical back: Normal range of motion and neck supple. No tenderness.     Comments: No specific midline tenderness, some tenderness around the left hip, TTP to the tip of right pinky toe  Skin:    General: Skin is warm and dry.     Capillary Refill: Capillary refill takes less than 2 seconds.     Comments: Superficial laceration/cut to the base of right pinky toe, hemostatic   Neurological:     General: No focal deficit present.     Mental Status: He is alert and oriented to person, place, and time.     Cranial Nerves: No cranial nerve deficit.     Sensory: No sensory deficit.     Motor: No weakness.     Comments: Mild tremors in upper  extremities   Psychiatric:        Mood and Affect: Mood normal.     ED Results / Procedures / Treatments   Labs (all labs ordered are listed, but only abnormal results are displayed) Labs Reviewed  CBC - Abnormal; Notable for the following components:      Result Value   Platelets 110 (*)    All other components within normal limits  URINALYSIS, ROUTINE W REFLEX MICROSCOPIC - Abnormal; Notable for the following components:   Specific Gravity, Urine 1.031 (*)    Glucose, UA >=500 (*)    Hgb urine dipstick MODERATE (*)    Ketones, ur 20 (*)    Protein, ur 30 (*)    All other components within normal limits  BASIC METABOLIC PANEL - Abnormal; Notable for the following components:   Sodium 128 (*)    Chloride 96 (*)    CO2 20 (*)    Glucose, Bld 400 (*)    BUN 25 (*)    Creatinine, Ser 1.93 (*)    GFR calc non Af Amer 39 (*)    GFR calc Af Amer 45 (*)    All other  components within normal limits    EKG EKG Interpretation  Date/Time:  Saturday April 23 2019 11:00:51 EST Ventricular Rate:  114 PR Interval:  144 QRS Duration: 76 QT Interval:  296 QTC Calculation: 407 R Axis:   83 Text Interpretation: Sinus tachycardia Right atrial enlargement Nonspecific ST abnormality Abnormal ECG Confirmed by Lennice Sites [phone]) on 04/23/2019 12:44:49 PM   Radiology DG Lumbar Spine 2-3 Views  Result Date: 04/23/2019 CLINICAL DATA:  Recent fall with low back pain, initial encounter EXAM: LUMBAR SPINE - 3 VIEW COMPARISON:  03/23/2019 FINDINGS: Postsurgical changes are noted at L4-5. vertebral body height is well maintained. Mild osteophytic changes are noted. No anterolisthesis is seen. Nonobstructing right renal stone is noted stable from prior CT. IMPRESSION: Postsurgical and degenerative changes stable from the prior exam. Nonobstructing right renal stone. Electronically Signed   By: Inez Catalina M.D.   On: 04/23/2019 13:36   CT Head Wo Contrast  Result Date: 04/22/2019 CLINICAL DATA:   Head trauma, headache. Syncope. EXAM: CT HEAD WITHOUT CONTRAST TECHNIQUE: Contiguous axial images were obtained from the base of the skull through the vertex without intravenous contrast. COMPARISON:  Most recent comparison head CT 09/18/2018 FINDINGS: Brain: No evidence of acute infarction, hemorrhage, hydrocephalus, extra-axial collection or mass lesion/mass effect. Stable atrophy. Vascular: No hyperdense vessel. Tortuous left vertebral artery again seen. Skull: No fracture or focal lesion. Sinuses/Orbits: No acute findings. Mild mucosal thickening of left maxillary sinus. Sinuses are otherwise clear. Included orbits are unremarkable. Other: None. IMPRESSION: No acute intracranial abnormality.  Stable generalized atrophy. Electronically Signed   By: Keith Rake M.D.   On: 04/22/2019 03:13   CT Cervical Spine Wo Contrast  Result Date: 04/22/2019 CLINICAL DATA:  Syncope. Neck trauma, blunt EXAM: CT CERVICAL SPINE WITHOUT CONTRAST TECHNIQUE: Multidetector CT imaging of the cervical spine was performed without intravenous contrast. Multiplanar CT image reconstructions were also generated. COMPARISON:  Most recent cervical spine CT 09/18/2018 FINDINGS: Alignment: Stable. Mild widening of C6-C7 disc space is unchanged. No traumatic subluxation. Skull base and vertebrae: No acute fracture. Vertebral body heights are maintained. The dens and skull base are intact. Bulky posterior osteophytes at C5-C6, unchanged. Additional anterior spurring at C4-C5, C3-C4 and C6-C7. Soft tissues and spinal canal: No prevertebral fluid or swelling. No visible canal hematoma. Disc levels: Posterior osteophytes at C5-C6 causes mild narrowing of the spinal canal. Additional endplate spurring with anterior osteophyte C3-C4 through C6-C7. Upper chest: No acute findings. Mild emphysema. Other: None. IMPRESSION: 1. No acute fracture or subluxation of the cervical spine. 2. Multilevel degenerative change with anterior and posterior  osteophytes. Posterior osteophytes at C5-C6 cause narrowing of the spinal canal, unchanged from previous. Electronically Signed   By: Keith Rake M.D.   On: 04/22/2019 03:17   DG Foot Complete Right  Result Date: 04/23/2019 CLINICAL DATA:  Right fifth digit pain, laceration. History of fifth proximal phalanx fracture EXAM: RIGHT FOOT COMPLETE - 3+ VIEW COMPARISON:  08/02/2017 FINDINGS: Osseous irregularity at the fifth metatarsal head and neck suspicious for a nondisplaced fracture. No dislocation. Remote healed fracture of the fifth proximal phalanx. Mild degenerative changes of the first MTP joint. Soft tissue swelling at the lateral aspect of the foot at the level of the fifth MTP joint. IMPRESSION: 1. Findings suspicious for a nondisplaced fracture of the fifth metatarsal head and neck. Correlation with point tenderness at this level is recommended. 2. Remote healed fifth proximal phalanx fracture. 3. Soft tissue swelling at the level of the fifth MTP  joint. Electronically Signed   By: Davina Poke D.O.   On: 04/23/2019 13:33   DG Hip Unilat With Pelvis 2-3 Views Left  Result Date: 04/23/2019 CLINICAL DATA:  Recent fall with hip pain, initial encounter EXAM: DG HIP (WITH OR WITHOUT PELVIS) 3V LEFT COMPARISON:  03/23/2019 FINDINGS: Pelvic ring is intact. Postsurgical changes are noted in the lower lumbar spine as well as bilateral hip replacements. No acute fracture or dislocation is noted. No soft tissue abnormality is seen. IMPRESSION: No acute abnormality noted. Electronically Signed   By: Inez Catalina M.D.   On: 04/23/2019 13:34    Procedures Procedures (including critical care time)  Medications Ordered in ED Medications  sodium chloride flush (NS) 0.9 % injection 3 mL (3 mLs Intravenous Given 04/23/19 1407)  traMADol (ULTRAM) tablet 50 mg (50 mg Oral Given 04/23/19 1400)  sodium chloride 0.9 % bolus 1,000 mL (1,000 mLs Intravenous New Bag/Given 04/23/19 1407)  insulin aspart (novoLOG)  injection 10 Units (10 Units Intravenous Given 04/23/19 1430)    ED Course  I have reviewed the triage vital signs and the nursing notes.  Pertinent labs & imaging results that were available during my care of the patient were reviewed by me and considered in my medical decision making (see chart for details).    MDM Rules/Calculators/A&P  [name] is a 54 year old male with history of depression, bipolar, diabetes, hypertension, polysubstance abuse, ADD who presents the ED with foot pain after a fall.  Patient with overall unremarkable vitals.  Has history of falls due to poor ambulation at baseline due to chronic tremors.  Patient is supposed to be using a walker at all times.  Patient was treated last night for high blood sugar and near syncope event.  Had unremarkable imaging and lab work.  Arrives here with a laceration to the bottom of the right pinky toe.  Overall superficial.  No need for repair as injuries at the base of the toe underneath the skin fold.  Wound was washed out thoroughly.  Patient's tetanus shot is up-to-date.  X-rays of the right foot, low back, left hip are overall unremarkable except for possible right pinky toe fracture.  Given history and physical assume that there is likely a nondisplaced fracture.  Placed in a postop shoe.  Given wound care instructions and recommend activity as tolerated.  Given information to follow-up with orthopedics if he continues to have prolonged pain. He does have chronic low back pain and chronic hip pain for which he takes tramadol.  He was given a dose of tramadol.  He was given IV fluids.  Blood sugar still elevated at 400 however patient is not in DKA.  Patient was given further treatment with insulin.  Given his chronic tremors and gait issues we will give him follow-up with neurology as he states that he is not been fully evaluated by them.  Believe he could benefit from further medication to help with his tremors and with his gait.   Recommend that he continue to use his walker at all times.  Discharged from ED in good condition.  Given return precautions. Given antibiotics for wound ppx given his DM history, educated about wound care and given strict return precautions.   This chart was dictated using voice recognition software.  Despite best efforts to proofread,  errors can occur which can change the documentation meaning.     Final Clinical Impression(s) / ED Diagnoses Final diagnoses:  Closed nondisplaced fracture of phalanx of  lesser toe of right foot, unspecified phalanx, initial encounter    Rx / DC Orders ED Discharge Orders         Ordered    cephALEXin (KEFLEX) 500 MG capsule  2 times daily     04/23/19 1446           Lennice Sites, DO 04/23/19 1448    Lennice Sites, DO 04/23/19 1450

## 2019-04-23 NOTE — Discharge Instructions (Signed)
Use walking boot with activity as tolerated.  Please keep your foot clean and dry as discussed.  You may use the Neosporin or bacitracin to the bottom of the right foot.  Please return to the ED if you develop any signs of infection in this area.

## 2019-04-23 NOTE — Progress Notes (Signed)
Orthopedic Tech Progress Note Patient Details:  [name] 06-06-1965 [id number]  Ortho Devices Type of Ortho Device: Prafo boot/shoe Ortho Device/Splint Location: right Ortho Device/Splint Interventions: Application   Post Interventions Patient Tolerated: Well Instructions Provided: Care of device   [place] Pink 04/23/2019, 2:14 PM

## 2019-04-23 NOTE — ED Triage Notes (Signed)
Pt to triage via [facility] from home.  Reports frequent falls and hyperglycemia.  CBG 443.  Seen in ED yesterday for syncopal episodes.  [place] again sometime during the night.  Lac between 4th and 5th toes with dried blood.

## 2019-04-23 NOTE — ED Notes (Signed)
Ortho tech called for post op shoe.

## 2019-04-28 ENCOUNTER — Telehealth: Payer: Self-pay | Admitting: Family Medicine

## 2019-04-28 NOTE — Telephone Encounter (Signed)
Patients sister would like a call back in regards to his frequent falls, she states he has broken a toe and has back problems. She would like a call back before his appointment for 05/05/2018 to see what she can do to prevent any further injuries while she is away at work. Please advise

## 2019-04-29 NOTE — Telephone Encounter (Signed)
He will need to use a walker in the mean time until his visit.

## 2019-04-29 NOTE — Telephone Encounter (Signed)
Will route to PCP for review.  Patient has OV with NP Minette Brine on 05/06/2019

## 2019-05-02 DIAGNOSIS — N5201 Erectile dysfunction due to arterial insufficiency: Secondary | ICD-10-CM | POA: Diagnosis not present

## 2019-05-02 DIAGNOSIS — N200 Calculus of kidney: Secondary | ICD-10-CM | POA: Diagnosis not present

## 2019-05-02 DIAGNOSIS — R350 Frequency of micturition: Secondary | ICD-10-CM | POA: Diagnosis not present

## 2019-05-02 DIAGNOSIS — N401 Enlarged prostate with lower urinary tract symptoms: Secondary | ICD-10-CM | POA: Diagnosis not present

## 2019-05-06 ENCOUNTER — Encounter: Payer: Self-pay | Admitting: Family

## 2019-05-06 ENCOUNTER — Ambulatory Visit: Payer: Medicare HMO | Attending: Family | Admitting: Family

## 2019-05-06 ENCOUNTER — Encounter: Payer: Self-pay | Admitting: Licensed Clinical Social Worker

## 2019-05-06 ENCOUNTER — Other Ambulatory Visit: Payer: Self-pay

## 2019-05-06 VITALS — BP 108/80 | HR 100 | Temp 97.9°F | Resp 18 | Wt 196.0 lb

## 2019-05-06 DIAGNOSIS — Z794 Long term (current) use of insulin: Secondary | ICD-10-CM | POA: Diagnosis not present

## 2019-05-06 DIAGNOSIS — F418 Other specified anxiety disorders: Secondary | ICD-10-CM

## 2019-05-06 DIAGNOSIS — M546 Pain in thoracic spine: Secondary | ICD-10-CM

## 2019-05-06 DIAGNOSIS — G8929 Other chronic pain: Secondary | ICD-10-CM

## 2019-05-06 DIAGNOSIS — E119 Type 2 diabetes mellitus without complications: Secondary | ICD-10-CM

## 2019-05-06 DIAGNOSIS — R410 Disorientation, unspecified: Secondary | ICD-10-CM

## 2019-05-06 LAB — GLUCOSE, POCT (MANUAL RESULT ENTRY): POC Glucose: 121 mg/dl — AB (ref 70–99)

## 2019-05-06 MED ORDER — TIZANIDINE HCL 4 MG PO CAPS: 4.0000 mg | ORAL_CAPSULE | Freq: Three times a day (TID) | ORAL | 0 refills | Status: DC

## 2019-05-06 NOTE — Patient Instructions (Signed)
Referral to Neuro. Referral to Ortho. Continue current medications. Preventing Falls and Fractures  Falls can be very serious, especially for older adults or people with osteoporosis  Falls can be caused by:  Tripping or slipping  Slow reflexes  Balance problems  Reduced muscle strength  Poor vision or a recent change in prescription  Illness and some medications (especially blood pressure pills, diuretics, heart medicines, muscle relaxants and sleep medications)  Drinking alcohol  To prevent falls outdoors:  Use a can or walker if needed  Wear rubber-soled shoes so you don't slip  DO NOT buy "shape up" shoes with rocker bottom soles if you have balance problems.  The thick soles and shape make it more difficult to keep your balance.  Put kitty litter or salt on icy sidewalks  Walk on the grass if the sidewalks are slick  Avoid walking on uneven ground whenever possible  T prevent falls indoors:  Keep rooms clutter-free, especially hallways, stairs and paths to light switches  Remove throw rugs  Install night lights, especially to and in the bathroom  Turn on lights before going downstairs  Keep a flashlight next to your bed  Buy a cordless phone to keep with you instead of jumping up to answer the phone  Install grab bars in the bathroom near the shower and toilet  Install rails on both sides of the stairs.  Make sure the stairs are well lit  Wear slippers with non-skid soles.  Do not walk around in stockings or socks  Balance problems and dizziness are not a normal part of growing older.  If you begin having balance problems or dizziness see your doctor.  Physical Therapy can help you with many balance problems, strengthening hip and leg muscles and with gait training.  To keep your bones healthy make sure you are getting enough calcium and Vitamin D each day.  Ask your doctor or pharmacist about supplements.  Regular weight-bearing exercise like walking,  lifting weights or dancing can help strengthen bones and prevent osteoporosis.

## 2019-05-06 NOTE — Progress Notes (Signed)
Sister at bedside to assist  ED f /u  Patient lives home alone. Needs assistance with medication management. Has had multiple falls.

## 2019-05-06 NOTE — Progress Notes (Signed)
Patient ID: JRUE JARRIEL, male    DOB: [birth year]  MRN: [id number]  CC: Hospitalization Follow-up   Subjective: [name] is a 54 y.o. male with a primary history of essential hypertension, GERD, diabetes type 2, diabetic neuropathy, acute encephalopathy, dementia early onset, degenerative disc disease, chronic kidney disease, schizoaffective disorder, insomnia, and history of seizures who presents for back pain. Patient's sister [name] is present during this office visit as an advocate for the patient.  1. Back Pain: Has history of falls for over 2 years. The most recent fall happened the week of New Year's Day in 2021. Reports falling backwards and landing on his back while walking in the hallway at home. States he does not remember what caused the fall. Denies tripping over anything in the hallway. When he fell he hit his head on the wall and left an indentation as stated by his sister who reports that is what she found when she visited his home after the fall. Patient was able to get up and call 911 for assistance after laying on the floor for 4 hours unable to get up. Reports that his left arm is also hurting as a result of the fall but was told by the Emergency Department that his arm is ok.   Back pain aggravated by walking. Pain rating 10/10 when walking or moving. Pain 5/10 when not walking or moving. Back pain relieved by Zanaflex. Robaxin does not help. Lidocaine patch does not help. Previous back surgery over 3 years ago.  Lives in an apartment alone since November 2020. Prior to living alone patient lived with sister for 9 years but wanted independence and moved out for that reason. Reports imbalance which causes falls. Ambulates daily with walker at home. Takes small steps and glides feet when walking. Stays in bed for the majority of the day. Does not eat meals per his sister who goes to visit him in the evenings when she leaves work and finds that he has food sitting out  that he hasn't eaten. Does not take prescribed medication as directed. Sister has placed all pills in a labeled medication container with the days of the week as well as setting alarms on his phone as a reminder. Sister reports that the patient may take 1 or 2 pills that are due each day and other days he doesn't take any pills. Patient states that he doesn't remember to take medications. Sister reports that patient can't take blood sugar because of his tremors which prevents him from holding still to prick his finger. Sister also states that patient doesn't take insulin shots as he is unable to remain still because of tremors in order to give himself an injection. Sister reports that a doctor whom she cant recall informed them that patient has a shrinking brain which was the explanation for patient's uncontrolled tremors. Was told he may have dementia or Parkinson's. This report was given more than 1.5 years ago when the patient was a resident at [facility]. Tremors that come and go. Sometimes tremors last for days in a row, other times tremors only last for a few minutes. Patient confirms the report related to missing medication doses and not taking pills as prescribed stating that he can't remember to take the medications. Has bowel and bladder incontinence and wears a Depends for management. Currently seeing a urologist and scheduled to return next week. Denies chest pain. Denies shortness of breath unless excited. Denies fever. Denies chills. Denies  radiation of back pain. Denies groin numbness. Has previous bilateral hip pain.   2. Anxiety & Depression:  Anxiety and depression related to feelings of "I am at the bottom most of the time." "My anxiety is constant." Can not specify triggers of anxiety and depression. Denies being suicidal at the moment. Denies wanting to harm himself at the moment. Denies access to weapons to harm himself. Sister is main support system. Admits smoking cigarettes 1  to 2 packs per month. Denies illicit drug use. Denies alcohol consumption. Denies taking prescribed medications for anxiety and depression stating that he forgets to take the medication. Admits unintentional weight loss and a decreased appetite. During the night awakens from sleep every 30 minutes and stays in bed until he can fall asleep again. Tramadol helps him stay asleep longer if he remembers to take it.   GAD 7 : Generalized Anxiety Score 05/06/2019 02/21/2019 11/17/2018 05/12/2018  Nervous, Anxious, on Edge 3 3 1 3   Control/stop worrying 3 3 1 3   Worry too much - different things 3 3 1 3   Trouble relaxing 3 3 1 3   Restless 3 3 1 3   Easily annoyed or irritable 3 3 1 3   Afraid - awful might happen 3 3 1 3   Total GAD 7 Score 21 21 7 21     Depression screen [facility] 2/9 05/06/2019 02/21/2019 11/17/2018  Decreased Interest 3 3 1   Down, Depressed, Hopeless 2 3 1   PHQ - 2 Score 5 6 2   Altered sleeping 3 3 1   Tired, decreased energy 3 3 1   Change in appetite 3 3 1   Feeling bad or failure about yourself  3 3 1   Trouble concentrating 3 3 1   Moving slowly or fidgety/restless 3 3 1   Suicidal thoughts 0 0 0  PHQ-9 Score 23 24 8   Some recent data might be hidden   3. Confusion: -Alert to person, place, and situation. Disoriented to time states that it is 2001.  Patient Active Problem List   Diagnosis Date Noted  . Closed displaced fracture of lateral malleolus of left fibula 08/02/2017  . BPH (benign prostatic hyperplasia) 07/24/2017  . Hemorrhoid 05/27/2017  . Shoulder arthritis 05/27/2017  . Diabetic neuropathy ([place]) 05/27/2017  . Degenerative disc disease, lumbar 02/04/2017  . Constipation   . History of seizures 09/20/2016  . Dementia, early onset with advanced brain atrophy for age 54/05/2016  . Acute lower UTI   . Urinary retention   . Acute metabolic encephalopathy 42/39/5320  . HLD (hyperlipidemia) 09/07/2016  . GERD (gastroesophageal reflux disease) 09/07/2016  . PTSD  (post-traumatic stress disorder) 09/07/2016  . Acute encephalopathy 09/07/2016  . Fall 09/07/2016  . Chronic kidney disease   . Ataxia 10/14/2015  . Hypokalemia 10/03/2015  . Hemiparesis ([place])   . Dyslipidemia 05/31/2014  . Insomnia 11/11/2013  . Degenerative joint disease (DJD) of hip 08/16/2013  . Erectile dysfunction 02/17/2011  . Constipation - functional 02/17/2011  . Diabetes type 2, uncontrolled ([place]) 11/06/2010  . Hypogonadism male 05/20/2010  . TOBACCO USER 05/20/2010  . Demoralization and apathy 05/20/2010  . Schizoaffective disorder, bipolar type ([place]) 01/18/2010  . Right shoulder pain 01/18/2010  . Anxiety state 12/05/2006  . Essential hypertension 12/05/2006     Current Outpatient Medications on File Prior to Visit  Medication Sig Dispense Refill  . acetaminophen (TYLENOL) 325 MG tablet Take 2 tablets (650 mg total) by mouth every 6 (six) hours as needed for mild pain (or Fever >/= 101).    Marland Kitchen  ARIPiprazole (ABILIFY) 5 MG tablet TAKE 1 TABLET BY MOUTH DAILY FOR DEPRESSION (Patient taking differently: Take 5 mg by mouth daily. ) 30 tablet 0  . atorvastatin (LIPITOR) 10 MG tablet TAKE 1 TABLET(10 MG) BY MOUTH DAILY 90 tablet 0  . B-D UF III MINI PEN NEEDLES 31G X 5 MM MISC USE FOUR TIMES DAILY 100 each 12  . buPROPion (WELLBUTRIN XL) 300 MG 24 hr tablet Take 1 tablet (300 mg total) by mouth daily. 30 tablet 0  . busPIRone (BUSPAR) 15 MG tablet Take 15 mg by mouth 3 (three) times daily.    . cyanocobalamin 500 MCG tablet Take 1 tablet (500 mcg total) by mouth daily. 30 tablet 0  . diclofenac sodium (VOLTAREN) 1 % GEL Apply 4 g topically 4 (four) times daily. 100 g 1  . divalproex (DEPAKOTE) 500 MG DR tablet Take 3 tablets (1,500 mg total) by mouth at bedtime. 90 tablet 0  . feeding supplement, ENSURE ENLIVE, (ENSURE ENLIVE) LIQD Take 237 mLs by mouth 2 (two) times daily between meals. 237 mL 12  . fluticasone (FLONASE) 50 MCG/ACT nasal spray SHAKE LIQUID AND USE 2 SPRAYS IN  EACH NOSTRIL DAILY 16 g 2  . insulin aspart (NOVOLOG FLEXPEN) 100 UNIT/ML FlexPen 0 to 12 units subcutaneously 3 times daily as per sliding scale 15 mL 11  . Insulin Detemir (LEVEMIR FLEXTOUCH) 100 UNIT/ML Pen Inject twice daily subcutaneously 38units in the morning and 38 units in the evening 15 mL 6  . lidocaine (LIDODERM) 5 % Place 1 patch onto the skin daily. Remove & Discard patch within 12 hours or as directed by MD 30 patch 0  . linagliptin (TRADJENTA) 5 MG TABS tablet Take 1 tablet (5 mg total) by mouth daily. 30 tablet 6  . lisinopril-hydrochlorothiazide (ZESTORETIC) 10-12.5 MG tablet Take 1 tablet by mouth daily. 30 tablet 6  . methocarbamol (ROBAXIN) 500 MG tablet Take 2 tablets (1,000 mg total) by mouth 4 (four) times daily. 120 tablet 6  . omeprazole (PRILOSEC) 40 MG capsule TAKE 1 CAPSULE(40 MG) BY MOUTH DAILY 30 capsule 2  . QUEtiapine (SEROQUEL) 400 MG tablet Take 1 tablet (400 mg total) by mouth at bedtime. 30 tablet 11  . Testosterone (ANDROGEL) 20.25 MG/1.25GM (1.62%) GEL Apply 20.25 mg topically every morning. Apply topically under arm pit alternating with each application. 1.25 g 3  . traMADol (ULTRAM) 50 MG tablet Take 1 tablet (50 mg total) by mouth at bedtime. 30 tablet 2  . traZODone (DESYREL) 150 MG tablet Take 1 tablet (150 mg total) by mouth at bedtime. 30 tablet 1  . bacitracin ointment Apply 1 application topically 3 (three) times daily. Apply to right fifth toe (Patient not taking: Reported on 05/06/2019) 120 g 0  . CHANTIX 1 MG tablet TAKE 1 TABLET(1 MG) BY MOUTH TWICE DAILY (Patient not taking: Reported on 05/06/2019) 60 tablet 2  . gabapentin (NEURONTIN) 300 MG capsule Take 1 capsule (300 mg total) by mouth 3 (three) times daily. (Patient not taking: Reported on 05/06/2019) 60 capsule 6  . glucose blood (ACCU-CHEK GUIDE) test strip CHECK SUGAR FOUR TIMES DAILY 100 each 12  . predniSONE (DELTASONE) 50 MG tablet Take 1 tablet (50 mg total) by mouth daily with breakfast.  (Patient not taking: Reported on 05/06/2019) 5 tablet 0  . tadalafil (CIALIS) 5 MG tablet TAKE ONE TABLET BY MOUTH EVERY DAY FOR BPH (Patient not taking: Reported on 05/06/2019) 30 tablet 2  . tiZANidine (ZANAFLEX) 4 MG tablet Take 1  tablet (4 mg total) by mouth every 8 (eight) hours as needed for muscle spasms. (Patient not taking: Reported on 05/06/2019) 20 tablet 0   No current facility-administered medications on file prior to visit.    Allergies  Allergen Reactions  . Vicodin [Hydrocodone-Acetaminophen] Itching    Social History   Socioeconomic History  . Marital status: Single    Spouse name: Not on file  . Number of children: Not on file  . Years of education: Not on file  . Highest education level: Not on file  Occupational History  . Occupation: disability  Tobacco Use  . Smoking status: Light Tobacco Smoker    Years: 0.25    Types: Cigarettes    Last attempt to quit: 02/19/2014    Years since quitting: 5.2  . Smokeless tobacco: Never Used  . Tobacco comment: 1 cig/ week  Substance and Sexual Activity  . Alcohol use: No  . Drug use: No    Comment: hx of marijuana/cocaine/crack use but sober since 20's  . Sexual activity: Not Currently  Other Topics Concern  . 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 Difficulty of Paying Living Expenses: Not on file  Food Insecurity:   . Worried About Charity fundraiser in the Last Year: Not on file  . Ran Out of Food in the Last Year: Not on file  Transportation Needs:   . Lack of Transportation (Medical): Not on file  . Lack of Transportation (Non-Medical): Not on file  Physical Activity:   . Days of Exercise per Week: Not on file  . Minutes of Exercise per Session: Not on file  Stress:   . Feeling of Stress : Not on file  Social Connections:   . Frequency of Communication with Friends and Family: Not on file  . Frequency of Social  Gatherings with Friends and Family: Not on file  . Attends Religious Services: Not on file  . Active Member of Clubs or Organizations: Not on file  . Attends Archivist Meetings: Not on file  . Marital Status: Not on file  Intimate Partner Violence:   . Fear of Current or Ex-Partner: Not on file  . Emotionally Abused: Not on file  . Physically Abused: Not on file  . Sexually Abused: Not on file    Family History  Problem Relation Age of Onset  . Diabetes Father   . Cancer Mother        died of melanoma with mets  . Cervical cancer Sister   . Diabetes Sister   . Other Neg Hx        hypogonadism    Past Surgical History:  Procedure Laterality Date  . BACK SURGERY    . CLOSED REDUCTION METACARPAL WITH PERCUTANEOUS PINNING Right   . LUMBAR DISC SURGERY    . TONSILLECTOMY    . TOTAL HIP ARTHROPLASTY Right 08/16/2013   Procedure: TOTAL HIP ARTHROPLASTY ANTERIOR APPROACH;  Surgeon: Hessie Dibble, MD;  Location: [facility];  Service: Orthopedics;  Laterality: Right;  . TOTAL HIP ARTHROPLASTY Left 05/22/2015   Procedure: TOTAL HIP ARTHROPLASTY ANTERIOR APPROACH;  Surgeon: Melrose Nakayama, MD;  Location: [facility];  Service: Orthopedics;  Laterality: Left;    ROS: Negative except as stated above  PHYSICAL EXAM: BP 108/80 (BP Location: Right Arm)   Pulse 100   Temp 97.9 F (36.6 C) (Oral)   Resp  18   Wt 196 lb (88.9 kg)   SpO2 99%   BMI 23.24 kg/m   Physical Exam General appearance - cooperative Mental status - alert, oriented to person and place; disoriented to time Neck - supple, no significant adenopathy, bilateral symmetric anterior adenopathy Chest - clear to auscultation, no wheezes, rales or rhonchi, symmetric air entry, no tachypnea, retractions or cyanosis Heart - normal rate, regular rhythm, normal S1, S2, no murmurs, rubs, clicks or gallops, normal rate and regular rhythm, S1 and S2 normal, no murmurs noted, no gallops noted Back exam - limited range of motion,  pain with motion noted during exam, tenderness noted left upper back, sensory exam intact bilateral upper and lower extremities, abnormal neurological exam: bilateral upper and lower extremities muscle strength 4/5.  Neurological - neck supple without rigidity, cranial nerves II through XII intact, abnormal findings: bilateral upper and lower extremities muscle strength 4/5. Mild tremors bilateral upper extremities. Musculoskeletal - abnormal active range of motion of back, abnormal muscle strength and tone of bilateral upper and lower extremities. Extremities - peripheral pulses normal, no pedal edema, no clubbing or cyanosis  CMP Latest Ref Rng & Units 04/23/2019 04/22/2019 04/21/2019  Glucose 70 - 99 mg/dL 400(H) 401(H) 659(HH)  BUN 6 - 20 mg/dL 25(H) 18 19  Creatinine 0.61 - 1.24 mg/dL 1.93(H) 1.96(H) 2.10(H)  Sodium 135 - 145 mmol/L 128(L) 130(L) 126(L)  Potassium 3.5 - 5.1 mmol/L 4.8 4.3 4.7  Chloride 98 - 111 mmol/L 96(L) 92(L) 91(L)  CO2 22 - 32 mmol/L 20(L) 25 22  Calcium 8.9 - 10.3 mg/dL 9.6 9.3 9.4  Total Protein 6.5 - 8.1 g/dL - 6.9 -  Total Bilirubin 0.3 - 1.2 mg/dL - 1.1 -  Alkaline Phos 38 - 126 U/L - 82 -  AST 15 - 41 U/L - 18 -  ALT 0 - 44 U/L - 14 -   Lipid Panel     Component Value Date/Time   CHOL 141 02/10/2017 0953   TRIG 259 (H) 02/10/2017 0953   HDL 34 (L) 02/10/2017 0953   CHOLHDL 4.1 02/10/2017 0953   CHOLHDL 7 06/02/2016 0844   VLDL 41 (H) 10/03/2015 0434   LDLCALC 55 02/10/2017 0953   LDLDIRECT 72.0 06/02/2016 0844    CBC    Component Value Date/Time   WBC 8.0 04/23/2019 1104   RBC 4.84 04/23/2019 1104   HGB 14.7 04/23/2019 1104   HGB 16.5 05/12/2018 1647   HCT 41.6 04/23/2019 1104   HCT 47.9 05/12/2018 1647   PLT 110 (L) 04/23/2019 1104   PLT 123 (L) 05/12/2018 1647   MCV 86.0 04/23/2019 1104   MCV 95 05/12/2018 1647   MCH 30.4 04/23/2019 1104   MCHC 35.3 04/23/2019 1104   RDW 12.1 04/23/2019 1104   RDW 15.4 05/12/2018 1647   LYMPHSABS 1.9  04/22/2019 0303   LYMPHSABS 2.3 05/12/2018 1647   MONOABS 1.2 (H) 04/22/2019 0303   EOSABS 0.0 04/22/2019 0303   EOSABS 0.0 05/12/2018 1647   BASOSABS 0.0 04/22/2019 0303   BASOSABS 0.0 05/12/2018 1647    ASSESSMENT AND PLAN: 1. CHRONIC BILATERAL THORACIC BACK PAIN:  -Referral to orthopedics for further evaluation and assessment -Continue Zanaflex for management of back pain -Tizanidine (ZANAFLEX); Take 1 tablet (4 mg total) by mouth every 8 (eight) hours as needed for muscle spasms. Dispense: 20 Refills: 0 -Continue Tramadol (ULTRAM); Take 1 tablet (50 mg total) by mouth at bedtime  2. DEPRESSION WITH ANXIETY: -Referral to social work for resources  available to assist with management  -Continue Bupropion [facility]); Take 1 tablet (300 mg) by mouth daily -Continue Buspirone (BUSPAR); Take 15 mg by mouth 3 (three) times daily -Continue Quetiapine (SEROQUEL); Take 1 tablet (400 mg) by mouth at bedtime  3. DISORIENTATION:  -Referral to neurology for further evaluation and assessment -Referral for home health assistance -Referral [facility] which provides assistance in the home offered in addition to home health assistance. Application required for Personal Care Services. -Referral Paramedicine assistance offered to patient. Paramedic comes to the home to assist with management and reminders to take medication. Application required for services. -Patient has [employer] and Medicaid insurance therefore eligible for transportation to and from medical appointments for free. -SCAT services provides transportation to and from any additional places outside of appointments for $1.50 per ride.  4. DIABETES:. -Continue Insulin Aspart (NOVOLOG FLEXPEN); 0 to 12 units subcutaneously 3 (three) times daily as per sliding scale -Continue Insulin Determir (LEVEMIR FLEXTOUCH); Inject twice daily subcutaneously 38 units in the morning and 38 units in the evening -Continue Linagliptin  (TRADJENTA); Take 1 tablet (5 mg total) by mouth daily DIABETES TYPE 2 Last A1C:   Results for orders placed or performed in visit on 05/06/19  Glucose (CBG)  Result Value Ref Range   POC Glucose 121 (A) 70 - 99 mg/dl     Patient was given the opportunity to ask questions.  Patient verbalized understanding of the plan and was able to repeat key elements of the plan.      Camillia Herter, NP

## 2019-05-06 NOTE — Progress Notes (Addendum)
MSW Intern met briefly with patient and patient's sister, [name] during a warm handoff encounter per Durene Fruits, NP. Patient reports he receives mental health counseling twice a month as well as psychiatric services at [facility] Patient's therapist is Rodena Piety. Patient reports he currently receives medication management for mental health conditions via psychiatrist at [facility]. Patient denies suicidal ideations and homicidal ideations.   Patient reports his recent symptoms of anxiety and depression are triggered by his physical health conditions. Patient and Patient's sister expressed need for home care services and additional transportation services. Patient's sister is only able to provide patient transportation to medical appointments 2x per month due to her work schedule. MSW Intern consulted with Eden Lathe, RN who recommended discussing the options of para-medicine and [place] in the home to provide additional support for patient. Patient verbally consented to initiating referrals to both programs. MSW Intern also encouraged patient to utilize Medicaid transportation for doctor's appointments and will assist patient in submitting a SCAT application next week.  Idamae Lusher MSW Intern 05/06/2019 2:10PM

## 2019-05-09 ENCOUNTER — Telehealth: Payer: Self-pay

## 2019-05-09 NOTE — Telephone Encounter (Signed)
Referral for community paramedicine sent  to attention of [facility] via [employer].

## 2019-05-10 ENCOUNTER — Telehealth: Payer: Self-pay

## 2019-05-10 NOTE — Telephone Encounter (Signed)
Completed PCS application faxed to [facility] 

## 2019-05-11 ENCOUNTER — Telehealth: Payer: Self-pay | Admitting: Licensed Clinical Social Worker

## 2019-05-11 NOTE — Telephone Encounter (Addendum)
MSW intern placed call to patient to complete SCAT application at [name] today. Patient reports he has fallen the past 2 mornings when trying to get out of bed. Pt reports he has not gotten up to eat or use the bathroom and is urinating and defecating in diapers. Pt reports he last ate yesterday evening when his brother-in-law came to check on him. Pt reports he is afraid to get out of bed because he continues to fall. Pt reports no suicidal ideation. MSW Intern was concerned for pt's health and contacted pt's sister, [name].  Upon contacting Pt's sister, [name], she reports pt's health has declined substantially over the past three days. Pt's sister reports she organizes pt's medication in pill organizer and prepares insulin syringes for pt. When sister visited pt on 05/09/19, she reports pt had taken a double dose of his medications on 05/08/19 and reports pt recently has been unable to keep track of morning and evening medications, frequently taking medications out of order. Pt's sister reports that Pt had defecated on himself, his bed, and the floor the past two days she has checked on him (05/08/19 and 05/09/19). Pt's sister also reports pt has a gash on his head from falling out of bed and an injury on his side.   MSW Inquired if pt's sister is able to allow pt to live with her until additional aid is brought into his home. Pt's sister reports she is unable to provide additional care/housing because she works from 6:30am - 5:30pm. Pt's sister has been checking on pt on her lunch break and in the evenings and is very concerned about pt's wellbeing.   Pt's sister states pt is in need of services in the home. Pt's sister is aware of PCS and Para-medicine referral.   MSW Intern made a APS report in attempt to expedite supportive services in the pt's home. Pt's sister is aware and supportive of APS report.

## 2019-05-12 ENCOUNTER — Telehealth: Payer: Self-pay

## 2019-05-12 NOTE — Telephone Encounter (Signed)
Order received for home health services. As per Leeanne Deed intern, the patient has no preference for home health agencies.  Referral faxed to [facility] for review

## 2019-05-14 ENCOUNTER — Other Ambulatory Visit: Payer: Self-pay

## 2019-05-14 ENCOUNTER — Inpatient Hospital Stay (HOSPITAL_COMMUNITY)
Admission: EM | Admit: 2019-05-14 | Discharge: 2019-05-20 | DRG: 683 | Disposition: A | Discharge status: Skilled Nursing Facility | Payer: Medicare HMO | Attending: Student in an Organized Health Care Education/Training Program | Admitting: Student in an Organized Health Care Education/Training Program

## 2019-05-14 ENCOUNTER — Emergency Department (HOSPITAL_COMMUNITY): Payer: Medicare HMO

## 2019-05-14 DIAGNOSIS — N179 Acute kidney failure, unspecified: Secondary | ICD-10-CM | POA: Diagnosis not present

## 2019-05-14 DIAGNOSIS — K219 Gastro-esophageal reflux disease without esophagitis: Secondary | ICD-10-CM | POA: Diagnosis not present

## 2019-05-14 DIAGNOSIS — R42 Dizziness and giddiness: Secondary | ICD-10-CM | POA: Diagnosis not present

## 2019-05-14 DIAGNOSIS — F419 Anxiety disorder, unspecified: Secondary | ICD-10-CM | POA: Diagnosis not present

## 2019-05-14 DIAGNOSIS — Z794 Long term (current) use of insulin: Secondary | ICD-10-CM | POA: Diagnosis not present

## 2019-05-14 DIAGNOSIS — Z87442 Personal history of urinary calculi: Secondary | ICD-10-CM

## 2019-05-14 DIAGNOSIS — S6992XA Unspecified injury of left wrist, hand and finger(s), initial encounter: Secondary | ICD-10-CM | POA: Diagnosis not present

## 2019-05-14 DIAGNOSIS — S299XXA Unspecified injury of thorax, initial encounter: Secondary | ICD-10-CM | POA: Diagnosis not present

## 2019-05-14 DIAGNOSIS — D61818 Other pancytopenia: Secondary | ICD-10-CM | POA: Diagnosis not present

## 2019-05-14 DIAGNOSIS — N1831 Chronic kidney disease, stage 3a: Secondary | ICD-10-CM | POA: Diagnosis not present

## 2019-05-14 DIAGNOSIS — G8929 Other chronic pain: Secondary | ICD-10-CM | POA: Diagnosis not present

## 2019-05-14 DIAGNOSIS — Z96643 Presence of artificial hip joint, bilateral: Secondary | ICD-10-CM | POA: Diagnosis present

## 2019-05-14 DIAGNOSIS — M6281 Muscle weakness (generalized): Secondary | ICD-10-CM | POA: Diagnosis not present

## 2019-05-14 DIAGNOSIS — F29 Unspecified psychosis not due to a substance or known physiological condition: Secondary | ICD-10-CM | POA: Diagnosis not present

## 2019-05-14 DIAGNOSIS — E860 Dehydration: Secondary | ICD-10-CM | POA: Diagnosis not present

## 2019-05-14 DIAGNOSIS — Z79899 Other long term (current) drug therapy: Secondary | ICD-10-CM | POA: Diagnosis not present

## 2019-05-14 DIAGNOSIS — M25532 Pain in left wrist: Secondary | ICD-10-CM | POA: Diagnosis present

## 2019-05-14 DIAGNOSIS — E1140 Type 2 diabetes mellitus with diabetic neuropathy, unspecified: Secondary | ICD-10-CM | POA: Diagnosis present

## 2019-05-14 DIAGNOSIS — M545 Low back pain, unspecified: Secondary | ICD-10-CM

## 2019-05-14 DIAGNOSIS — R296 Repeated falls: Secondary | ICD-10-CM

## 2019-05-14 DIAGNOSIS — R0902 Hypoxemia: Secondary | ICD-10-CM | POA: Diagnosis not present

## 2019-05-14 DIAGNOSIS — R519 Headache, unspecified: Secondary | ICD-10-CM | POA: Diagnosis present

## 2019-05-14 DIAGNOSIS — N401 Enlarged prostate with lower urinary tract symptoms: Secondary | ICD-10-CM | POA: Diagnosis not present

## 2019-05-14 DIAGNOSIS — E1122 Type 2 diabetes mellitus with diabetic chronic kidney disease: Secondary | ICD-10-CM | POA: Diagnosis present

## 2019-05-14 DIAGNOSIS — M5136 Other intervertebral disc degeneration, lumbar region: Secondary | ICD-10-CM

## 2019-05-14 DIAGNOSIS — M542 Cervicalgia: Secondary | ICD-10-CM | POA: Diagnosis not present

## 2019-05-14 DIAGNOSIS — W1830XA Fall on same level, unspecified, initial encounter: Secondary | ICD-10-CM | POA: Diagnosis present

## 2019-05-14 DIAGNOSIS — G9349 Other encephalopathy: Secondary | ICD-10-CM | POA: Diagnosis not present

## 2019-05-14 DIAGNOSIS — Z833 Family history of diabetes mellitus: Secondary | ICD-10-CM

## 2019-05-14 DIAGNOSIS — F988 Other specified behavioral and emotional disorders with onset usually occurring in childhood and adolescence: Secondary | ICD-10-CM | POA: Diagnosis not present

## 2019-05-14 DIAGNOSIS — F0280 Dementia in other diseases classified elsewhere without behavioral disturbance: Secondary | ICD-10-CM | POA: Diagnosis not present

## 2019-05-14 DIAGNOSIS — M25552 Pain in left hip: Secondary | ICD-10-CM | POA: Diagnosis not present

## 2019-05-14 DIAGNOSIS — I10 Essential (primary) hypertension: Secondary | ICD-10-CM | POA: Diagnosis not present

## 2019-05-14 DIAGNOSIS — Z20822 Contact with and (suspected) exposure to covid-19: Secondary | ICD-10-CM | POA: Diagnosis present

## 2019-05-14 DIAGNOSIS — E119 Type 2 diabetes mellitus without complications: Secondary | ICD-10-CM | POA: Diagnosis not present

## 2019-05-14 DIAGNOSIS — R450 Nervousness: Secondary | ICD-10-CM | POA: Diagnosis not present

## 2019-05-14 DIAGNOSIS — R32 Unspecified urinary incontinence: Secondary | ICD-10-CM | POA: Diagnosis present

## 2019-05-14 DIAGNOSIS — Z915 Personal history of self-harm: Secondary | ICD-10-CM

## 2019-05-14 DIAGNOSIS — E785 Hyperlipidemia, unspecified: Secondary | ICD-10-CM | POA: Diagnosis present

## 2019-05-14 DIAGNOSIS — W19XXXA Unspecified fall, initial encounter: Secondary | ICD-10-CM | POA: Diagnosis not present

## 2019-05-14 DIAGNOSIS — Z885 Allergy status to narcotic agent status: Secondary | ICD-10-CM | POA: Diagnosis not present

## 2019-05-14 DIAGNOSIS — I129 Hypertensive chronic kidney disease with stage 1 through stage 4 chronic kidney disease, or unspecified chronic kidney disease: Secondary | ICD-10-CM | POA: Diagnosis present

## 2019-05-14 DIAGNOSIS — F4310 Post-traumatic stress disorder, unspecified: Secondary | ICD-10-CM | POA: Diagnosis not present

## 2019-05-14 DIAGNOSIS — M2550 Pain in unspecified joint: Secondary | ICD-10-CM | POA: Diagnosis not present

## 2019-05-14 DIAGNOSIS — F339 Major depressive disorder, recurrent, unspecified: Secondary | ICD-10-CM | POA: Diagnosis not present

## 2019-05-14 DIAGNOSIS — D649 Anemia, unspecified: Secondary | ICD-10-CM | POA: Diagnosis present

## 2019-05-14 DIAGNOSIS — F319 Bipolar disorder, unspecified: Secondary | ICD-10-CM | POA: Diagnosis not present

## 2019-05-14 DIAGNOSIS — Z5329 Procedure and treatment not carried out because of patient's decision for other reasons: Secondary | ICD-10-CM | POA: Diagnosis present

## 2019-05-14 DIAGNOSIS — S0990XA Unspecified injury of head, initial encounter: Secondary | ICD-10-CM | POA: Diagnosis not present

## 2019-05-14 DIAGNOSIS — Z79891 Long term (current) use of opiate analgesic: Secondary | ICD-10-CM

## 2019-05-14 DIAGNOSIS — S199XXA Unspecified injury of neck, initial encounter: Secondary | ICD-10-CM | POA: Diagnosis not present

## 2019-05-14 DIAGNOSIS — M549 Dorsalgia, unspecified: Secondary | ICD-10-CM | POA: Diagnosis not present

## 2019-05-14 DIAGNOSIS — Z9181 History of falling: Secondary | ICD-10-CM | POA: Diagnosis not present

## 2019-05-14 DIAGNOSIS — Z7401 Bed confinement status: Secondary | ICD-10-CM | POA: Diagnosis not present

## 2019-05-14 DIAGNOSIS — R531 Weakness: Secondary | ICD-10-CM | POA: Diagnosis not present

## 2019-05-14 DIAGNOSIS — N400 Enlarged prostate without lower urinary tract symptoms: Secondary | ICD-10-CM | POA: Diagnosis present

## 2019-05-14 DIAGNOSIS — N39498 Other specified urinary incontinence: Secondary | ICD-10-CM | POA: Diagnosis not present

## 2019-05-14 LAB — CBC WITH DIFFERENTIAL/PLATELET
Abs Immature Granulocytes: 0.03 10*3/uL (ref 0.00–0.07)
Basophils Absolute: 0 10*3/uL (ref 0.0–0.1)
Basophils Relative: 0 %
Eosinophils Absolute: 0 10*3/uL (ref 0.0–0.5)
Eosinophils Relative: 0 %
HCT: 40.4 % (ref 39.0–52.0)
Hemoglobin: 13.8 g/dL (ref 13.0–17.0)
Immature Granulocytes: 1 %
Lymphocytes Relative: 34 %
Lymphs Abs: 2 10*3/uL (ref 0.7–4.0)
MCH: 30.1 pg (ref 26.0–34.0)
MCHC: 34.2 g/dL (ref 30.0–36.0)
MCV: 88 fL (ref 80.0–100.0)
Monocytes Absolute: 0.9 10*3/uL (ref 0.1–1.0)
Monocytes Relative: 16 %
Neutro Abs: 2.9 10*3/uL (ref 1.7–7.7)
Neutrophils Relative %: 49 %
Platelets: DECREASED 10*3/uL (ref 150–400)
RBC: 4.59 MIL/uL (ref 4.22–5.81)
RDW: 12.1 % (ref 11.5–15.5)
WBC: 5.8 10*3/uL (ref 4.0–10.5)
nRBC: 0 % (ref 0.0–0.2)

## 2019-05-14 LAB — CBG MONITORING, ED: Glucose-Capillary: 101 mg/dL — ABNORMAL HIGH (ref 70–99)

## 2019-05-14 LAB — SARS CORONAVIRUS 2 (TAT 6-24 HRS): SARS Coronavirus 2: NEGATIVE

## 2019-05-14 LAB — BASIC METABOLIC PANEL
Anion gap: 11 (ref 5–15)
BUN: 58 mg/dL — ABNORMAL HIGH (ref 6–20)
CO2: 25 mmol/L (ref 22–32)
Calcium: 9.7 mg/dL (ref 8.9–10.3)
Chloride: 98 mmol/L (ref 98–111)
Creatinine, Ser: 2.53 mg/dL — ABNORMAL HIGH (ref 0.61–1.24)
GFR calc Af Amer: 33 mL/min — ABNORMAL LOW (ref >60)
GFR calc non Af Amer: 28 mL/min — ABNORMAL LOW (ref >60)
Glucose, Bld: 95 mg/dL (ref 70–99)
Potassium: 5 mmol/L (ref 3.5–5.1)
Sodium: 134 mmol/L — ABNORMAL LOW (ref 135–145)

## 2019-05-14 LAB — GLUCOSE, CAPILLARY: Glucose-Capillary: 184 mg/dL — ABNORMAL HIGH (ref 70–99)

## 2019-05-14 LAB — HIV ANTIBODY (ROUTINE TESTING W REFLEX): HIV Screen 4th Generation wRfx: NONREACTIVE

## 2019-05-14 LAB — TROPONIN I (HIGH SENSITIVITY)
Troponin I (High Sensitivity): 5 ng/L (ref <18)
Troponin I (High Sensitivity): 6 ng/L (ref <18)

## 2019-05-14 LAB — HEMOGLOBIN A1C
Hgb A1c MFr Bld: 8.3 % — ABNORMAL HIGH (ref 4.8–5.6)
Mean Plasma Glucose: 191.51 mg/dL

## 2019-05-14 MED ORDER — INSULIN ASPART 100 UNIT/ML ~~LOC~~ SOLN
0.0000 [IU] | Freq: Three times a day (TID) | SUBCUTANEOUS | Status: DC
  Administered 2019-05-15: 2 [IU] via SUBCUTANEOUS
  Administered 2019-05-16 – 2019-05-17 (×3): 3 [IU] via SUBCUTANEOUS
  Administered 2019-05-17 (×2): 2 [IU] via SUBCUTANEOUS
  Administered 2019-05-18 – 2019-05-19 (×3): 3 [IU] via SUBCUTANEOUS
  Administered 2019-05-19: 5 [IU] via SUBCUTANEOUS
  Administered 2019-05-20: 2 [IU] via SUBCUTANEOUS

## 2019-05-14 MED ORDER — BUPROPION HCL ER (XL) 150 MG PO TB24
300.0000 mg | ORAL_TABLET | Freq: Every day | ORAL | Status: DC
  Administered 2019-05-15 – 2019-05-20 (×6): 300 mg via ORAL
  Filled 2019-05-14 (×6): qty 2

## 2019-05-14 MED ORDER — LORAZEPAM 2 MG/ML IJ SOLN
1.0000 mg | Freq: Once | INTRAMUSCULAR | Status: AC
  Administered 2019-05-14: 10:00:00 1 mg via INTRAMUSCULAR
  Filled 2019-05-14: qty 1

## 2019-05-14 MED ORDER — QUETIAPINE FUMARATE 400 MG PO TABS
400.0000 mg | ORAL_TABLET | Freq: Every day | ORAL | Status: DC
  Administered 2019-05-14 – 2019-05-19 (×6): 400 mg via ORAL
  Filled 2019-05-14: qty 4
  Filled 2019-05-14: qty 1
  Filled 2019-05-14: qty 4
  Filled 2019-05-14 (×2): qty 1
  Filled 2019-05-14: qty 4
  Filled 2019-05-14 (×2): qty 1
  Filled 2019-05-14: qty 4
  Filled 2019-05-14: qty 1
  Filled 2019-05-14: qty 4
  Filled 2019-05-14: qty 1

## 2019-05-14 MED ORDER — BUSPIRONE HCL 10 MG PO TABS
15.0000 mg | ORAL_TABLET | Freq: Three times a day (TID) | ORAL | Status: DC
  Administered 2019-05-14 – 2019-05-20 (×17): 15 mg via ORAL
  Filled 2019-05-14 (×5): qty 1.5
  Filled 2019-05-14 (×2): qty 3
  Filled 2019-05-14 (×2): qty 1.5
  Filled 2019-05-14 (×3): qty 3
  Filled 2019-05-14 (×3): qty 1.5
  Filled 2019-05-14 (×2): qty 3
  Filled 2019-05-14 (×2): qty 1.5
  Filled 2019-05-14: qty 3
  Filled 2019-05-14: qty 1.5
  Filled 2019-05-14 (×3): qty 3
  Filled 2019-05-14 (×3): qty 1.5
  Filled 2019-05-14 (×2): qty 3
  Filled 2019-05-14: qty 1.5
  Filled 2019-05-14: qty 3
  Filled 2019-05-14: qty 1.5
  Filled 2019-05-14: qty 3
  Filled 2019-05-14 (×2): qty 1.5

## 2019-05-14 MED ORDER — INSULIN DETEMIR 100 UNIT/ML ~~LOC~~ SOLN
30.0000 [IU] | Freq: Two times a day (BID) | SUBCUTANEOUS | Status: DC
  Administered 2019-05-14 – 2019-05-16 (×4): 30 [IU] via SUBCUTANEOUS
  Filled 2019-05-14 (×5): qty 0.3

## 2019-05-14 MED ORDER — LACTATED RINGERS IV BOLUS
500.0000 mL | Freq: Once | INTRAVENOUS | Status: AC
  Administered 2019-05-14: 500 mL via INTRAVENOUS

## 2019-05-14 MED ORDER — LACTATED RINGERS IV SOLN: INTRAVENOUS | Status: DC

## 2019-05-14 MED ORDER — LORAZEPAM 2 MG/ML IJ SOLN: 1.0000 mg | Freq: Once | INTRAMUSCULAR | Status: DC

## 2019-05-14 MED ORDER — INSULIN ASPART 100 UNIT/ML ~~LOC~~ SOLN: 0.0000 [IU] | Freq: Every day | SUBCUTANEOUS | Status: DC

## 2019-05-14 MED ORDER — METHOCARBAMOL 500 MG PO TABS: 1000.0000 mg | ORAL_TABLET | Freq: Four times a day (QID) | ORAL | Status: DC

## 2019-05-14 MED ORDER — ENOXAPARIN SODIUM 30 MG/0.3ML ~~LOC~~ SOLN
30.0000 mg | SUBCUTANEOUS | Status: DC
  Administered 2019-05-14 – 2019-05-17 (×4): 30 mg via SUBCUTANEOUS
  Filled 2019-05-14 (×4): qty 0.3

## 2019-05-14 MED ORDER — ATORVASTATIN CALCIUM 10 MG PO TABS
10.0000 mg | ORAL_TABLET | Freq: Every day | ORAL | Status: DC
  Administered 2019-05-14 – 2019-05-19 (×6): 10 mg via ORAL
  Filled 2019-05-14 (×6): qty 1

## 2019-05-14 MED ORDER — PANTOPRAZOLE SODIUM 40 MG PO TBEC
40.0000 mg | DELAYED_RELEASE_TABLET | Freq: Every day | ORAL | Status: DC
  Administered 2019-05-15 – 2019-05-20 (×6): 40 mg via ORAL
  Filled 2019-05-14 (×6): qty 1

## 2019-05-14 MED ORDER — TRAZODONE HCL 50 MG PO TABS
150.0000 mg | ORAL_TABLET | Freq: Every day | ORAL | Status: DC
  Administered 2019-05-14 – 2019-05-19 (×6): 150 mg via ORAL
  Filled 2019-05-14 (×6): qty 1

## 2019-05-14 MED ORDER — LORAZEPAM 2 MG/ML IJ SOLN: 2.0000 mg | Freq: Once | INTRAMUSCULAR | Status: DC

## 2019-05-14 MED ORDER — DIVALPROEX SODIUM 500 MG PO DR TAB
1500.0000 mg | DELAYED_RELEASE_TABLET | Freq: Every day | ORAL | Status: DC
  Administered 2019-05-14 – 2019-05-19 (×6): 1500 mg via ORAL
  Filled 2019-05-14 (×6): qty 3

## 2019-05-14 MED ORDER — DIVALPROEX SODIUM 250 MG PO DR TAB
1500.0000 mg | DELAYED_RELEASE_TABLET | Freq: Every day | ORAL | Status: DC
  Filled 2019-05-14: qty 6

## 2019-05-14 MED ORDER — ACETAMINOPHEN 325 MG PO TABS
650.0000 mg | ORAL_TABLET | Freq: Four times a day (QID) | ORAL | Status: DC | PRN
  Administered 2019-05-16: 650 mg via ORAL
  Filled 2019-05-14: qty 2

## 2019-05-14 MED ORDER — TRAMADOL HCL 50 MG PO TABS
50.0000 mg | ORAL_TABLET | Freq: Every day | ORAL | Status: DC
  Administered 2019-05-14 – 2019-05-19 (×6): 50 mg via ORAL
  Filled 2019-05-14 (×6): qty 1

## 2019-05-14 MED ORDER — TIZANIDINE HCL 4 MG PO TABS
4.0000 mg | ORAL_TABLET | Freq: Three times a day (TID) | ORAL | Status: DC
  Administered 2019-05-14 – 2019-05-20 (×17): 4 mg via ORAL
  Filled 2019-05-14 (×7): qty 1
  Filled 2019-05-14 (×2): qty 2
  Filled 2019-05-14 (×2): qty 1
  Filled 2019-05-14: qty 2
  Filled 2019-05-14 (×2): qty 1
  Filled 2019-05-14 (×2): qty 2
  Filled 2019-05-14 (×4): qty 1
  Filled 2019-05-14: qty 2
  Filled 2019-05-14 (×3): qty 1
  Filled 2019-05-14 (×6): qty 2
  Filled 2019-05-14 (×2): qty 1

## 2019-05-14 MED ORDER — TRAZODONE HCL 50 MG PO TABS: 150.0000 mg | ORAL_TABLET | Freq: Every day | ORAL | Status: DC

## 2019-05-14 MED ORDER — ARIPIPRAZOLE 5 MG PO TABS
5.0000 mg | ORAL_TABLET | Freq: Every day | ORAL | Status: DC
  Administered 2019-05-15 – 2019-05-20 (×6): 5 mg via ORAL
  Filled 2019-05-14 (×6): qty 1

## 2019-05-14 MED ORDER — GABAPENTIN 300 MG PO CAPS
300.0000 mg | ORAL_CAPSULE | Freq: Two times a day (BID) | ORAL | Status: DC
  Administered 2019-05-14 – 2019-05-20 (×12): 300 mg via ORAL
  Filled 2019-05-14 (×12): qty 1

## 2019-05-14 NOTE — Progress Notes (Signed)
Pt brought in for triple study. Pt started knocking on bore once scan first began, said he was in pain and I offered to get his nurse for meds. Nurse administered 20ml Ativan and waited  For meds to take effect. Once patient was ready we tried again. Pt stopped exam 3 times and unable to complete any imaging. Asked to be taken out of scanner. Pt sent back to room, nurse called and aware of situation.

## 2019-05-14 NOTE — ED Triage Notes (Signed)
Pt arrives from [facility] via [facility] where he had a fall at 0300- Pt states he became dizzy and fell which happens very often per patient. Pt report low back pain. Pt has difficultly walking due to a neurologic diease that causes weakness. Pt uses walker at home.

## 2019-05-14 NOTE — ED Provider Notes (Signed)
Emergency Department Provider Note   I have reviewed the triage vital signs and the nursing notes.   HISTORY  Chief Complaint Fall   HPI [name] is a 54 y.o. male with complicated past medical history reviewed below presents to the emergency department with increased falls at home with worsening lower back pain and increased numbness in the bilateral legs.  Patient presents this morning after a fall overnight.  He does not recall the specifics of the fall.  He lives alone with his sister checking on him daily.  He tells me that typically he ambulates with a walker but is not sure if he was using that today.  He is having worsening pain in his lower back.  He has baseline numbness in both legs but feels like it is getting worse over the past 1 to 2 weeks.  He is experiencing some groin numbness but denies any urinary retention type symptoms or incontinence.  No fevers.  Denies neck pain.  No worsening weakness/numbness in the upper extremities.  He does have pain in the left wrist/forearm area after his most recent fall.  He does tell me that last night he had another fall which resulted in head trauma and he believes an episode of loss of consciousness.  He has been referred to a neurologist and has an appointment in the next 45 days. No radiation of symptoms or modifying factors.    Past Medical History:  Diagnosis Date   ADD (attention deficit disorder)    Anxiety    Arthritis    right hip   Bipolar 1 disorder (HCC)    Blood in urine    CKD (chronic kidney disease), stage III    Creatinine elevation    Dementia ([place])    "early onset" (08/04/2017)   Depression    bipolar [facility]   Diabetes mellitus without complication ([place])    Family history of anesthesia complication    pt is unsure , but pt father may have been difficult to arouse    HCAP (healthcare-associated pneumonia) 10/31/2012   History of kidney stones    Hypertension    Hypogonadism male     Liver fatty degeneration    Microscopic hematuria    hereditary s/p Urology eval   Osteoarthritis of right hip 11/28/2011   2012 2015 s/p THR Severe  Dr Novella Olive     Pleural effusion 11/02/2012   Pneumonia 10-2012   Pneumonia, organism unspecified(486) 11/02/2012   Polysubstance dependence, non-opioid, in remission ([place])    remote   Primary osteoarthritis of left hip 05/22/2015   PTSD (post-traumatic stress disorder)    SOCIAL ANXIETY DISORDER    Suicide attempt by multiple drug overdose ([place]) 22-Jan-2016   Grieving his cat's death 08-08-2015    Patient Active Problem List   Diagnosis Date Noted   AKI (acute kidney injury) ([place]) 05/14/2019   Closed displaced fracture of lateral malleolus of left fibula 08/02/2017   BPH (benign prostatic hyperplasia) 07/24/2017   Hemorrhoid 05/27/2017   Shoulder arthritis 05/27/2017   Diabetic neuropathy ([place]) 05/27/2017   Degenerative disc disease, lumbar 02/04/2017   Constipation    History of seizures 09/20/2016   Dementia, early onset with advanced brain atrophy for age 32/05/2016   Acute lower UTI    Urinary retention    Acute metabolic encephalopathy 09/32/3557   HLD (hyperlipidemia) 09/07/2016   GERD (gastroesophageal reflux disease) 09/07/2016   PTSD (post-traumatic stress disorder) 09/07/2016   Acute encephalopathy 09/07/2016  Fall 09/07/2016   Chronic kidney disease    Ataxia 10/14/2015   Hypokalemia 10/03/2015   Hemiparesis ([place])    Dyslipidemia 05/31/2014   Insomnia 11/11/2013   Degenerative joint disease (DJD) of hip 08/16/2013   Erectile dysfunction 02/17/2011   Constipation - functional 02/17/2011   Diabetes type 2, uncontrolled ([place]) 11/06/2010   Hypogonadism male 05/20/2010   TOBACCO USER 05/20/2010   Demoralization and apathy 05/20/2010   Schizoaffective disorder, bipolar type ([place]) 01/18/2010   Right shoulder pain 01/18/2010   Anxiety state 12/05/2006   Essential hypertension  12/05/2006    Past Surgical History:  Procedure Laterality Date   BACK SURGERY     CLOSED REDUCTION METACARPAL WITH PERCUTANEOUS PINNING Right    LUMBAR [place] SURGERY     TONSILLECTOMY     TOTAL HIP ARTHROPLASTY Right 08/16/2013   Procedure: TOTAL HIP ARTHROPLASTY ANTERIOR APPROACH;  Surgeon: Hessie Dibble, MD;  Location: [facility];  Service: Orthopedics;  Laterality: Right;   TOTAL HIP ARTHROPLASTY Left 05/22/2015   Procedure: TOTAL HIP ARTHROPLASTY ANTERIOR APPROACH;  Surgeon: Melrose Nakayama, MD;  Location: [facility];  Service: Orthopedics;  Laterality: Left;    Allergies Vicodin [hydrocodone-acetaminophen]  Family History  Problem Relation Age of Onset   Diabetes Father    Cancer Mother        died of melanoma with mets   Cervical cancer Sister    Diabetes Sister    Other Neg Hx        hypogonadism    Social History Social History   Tobacco Use   Smoking status: Light Tobacco Smoker    Years: 0.25    Types: Cigarettes    Last attempt to quit: 02/19/2014    Years since quitting: 5.2   Smokeless tobacco: Never Used   Tobacco comment: 1 cig/ week  Substance Use Topics   Alcohol use: No   Drug use: No    Comment: hx of marijuana/cocaine/crack use but sober since 20's    Review of Systems  Constitutional: No fever/chills Eyes: No visual changes. ENT: No sore throat. Cardiovascular: Denies chest pain. Respiratory: Denies shortness of breath. Gastrointestinal: No abdominal pain.  No nausea, no vomiting.  No diarrhea.  No constipation. Genitourinary: Negative for dysuria. Musculoskeletal: Positive back pain and left wrist pain.  Skin: Negative for rash. Neurological: Negative for headaches. Positive bilateral LE weakness/numbness (worsening) with groin numbness.   10-point ROS otherwise negative.  ____________________________________________   PHYSICAL EXAM:  VITAL SIGNS: ED Triage Vitals  Enc Vitals Group     BP 05/14/19 0802 114/81     Pulse  Rate 05/14/19 0815 77     Resp 05/14/19 0802 14     Temp 05/14/19 0804 98.5 F (36.9 C)     Temp Source 05/14/19 0804 Oral     SpO2 05/14/19 0815 99 %   Constitutional: Alert and oriented. Well appearing and in no acute distress. Eyes: Conjunctivae are normal. PERRL.  Head: Atraumatic. Nose: No congestion/rhinnorhea. Mouth/Throat: Mucous membranes are moist Neck: No stridor.  No cervical spine tenderness to palpation. Cardiovascular: Normal rate, regular rhythm. Good peripheral circulation. Grossly normal heart sounds.   Respiratory: Normal respiratory effort.  No retractions. Lungs CTAB. Gastrointestinal: Soft and nontender. No distention.  Musculoskeletal: No gross deformities of extremities. Neurologic:  Normal speech and language.  Decreased sensation to the bilateral lower extremities to light touch.  Patient with normal strength in both upper and lower extremities.  No cranial nerve deficits appreciated. 2+ patellar reflexes.  Skin:  Skin is warm, dry and intact. No rash noted.  ____________________________________________   LABS (all labs ordered are listed, but only abnormal results are displayed)  Labs Reviewed  BASIC METABOLIC PANEL - Abnormal; Notable for the following components:      Result Value   Sodium 134 (*)    BUN 58 (*)    Creatinine, Ser 2.53 (*)    GFR calc non Af Amer 28 (*)    GFR calc Af Amer 33 (*)    All other components within normal limits  HEMOGLOBIN A1C - Abnormal; Notable for the following components:   Hgb A1c MFr Bld 8.3 (*)    All other components within normal limits  CBG MONITORING, ED - Abnormal; Notable for the following components:   Glucose-Capillary 101 (*)    All other components within normal limits  SARS CORONAVIRUS 2 (TAT 6-24 HRS)  CBC WITH DIFFERENTIAL/PLATELET  HIV ANTIBODY (ROUTINE TESTING W REFLEX)  CBC WITH DIFFERENTIAL/PLATELET  URINALYSIS, ROUTINE W REFLEX MICROSCOPIC  BASIC METABOLIC PANEL  CBC  TROPONIN I (HIGH  SENSITIVITY)  TROPONIN I (HIGH SENSITIVITY)   ____________________________________________  EKG   EKG Interpretation  Date/Time:  Saturday May 14 2019 08:02:44 EST Ventricular Rate:  81 PR Interval:    QRS Duration: 92 QT Interval:  322 QTC Calculation: 374 R Axis:   62 Text Interpretation: Sinus rhythm Low voltage, extremity leads No STEMI Confirmed by Nanda Quinton [phone]) on 05/14/2019 8:23:47 AM Also confirmed by Nanda Quinton [phone]), editor Hattie Perch [id number])  on 05/14/2019 9:57:54 AM       ____________________________________________  RADIOLOGY  DG Wrist Complete Left  Result Date: 05/14/2019 CLINICAL DATA:  Fall this morning. EXAM: LEFT WRIST - COMPLETE 3+ VIEW COMPARISON:  None. FINDINGS: There is no evidence of fracture or dislocation. There is no evidence of arthropathy or other focal bone abnormality. Soft tissues are unremarkable. IMPRESSION: Negative. Electronically Signed   By: Franki Cabot M.D.   On: 05/14/2019 10:04   CT Head Wo Contrast  Result Date: 05/14/2019 CLINICAL DATA:  Fall this morning, dizziness weakness. EXAM: CT HEAD WITHOUT CONTRAST CT CERVICAL SPINE WITHOUT CONTRAST TECHNIQUE: Multidetector CT imaging of the head and cervical spine was performed following the standard protocol without intravenous contrast. Multiplanar CT image reconstructions of the cervical spine were also generated. COMPARISON:  Cervical spine CT dated 04/22/2019 FINDINGS: CT HEAD FINDINGS Brain: Generalized parenchymal volume loss with commensurate dilatation of the ventricles and sulci. No mass, hemorrhage, edema or other evidence of acute parenchymal abnormality. No extra-axial hemorrhage. Vascular: No hyperdense vessel or unexpected calcification. Skull: Normal. Negative for fracture or focal lesion. Sinuses/Orbits: No acute finding. Other: None. CT CERVICAL SPINE FINDINGS Alignment: Stable mild levoscoliosis. No evidence of acute vertebral body subluxation. Skull base  and vertebrae: No fracture line or displaced fracture fragment identified. Facet joints are normally aligned. No acute or suspicious osseous lesion. Soft tissues and spinal canal: No prevertebral fluid or swelling. No visible canal hematoma. Disc levels: Prominent disc-osteophytic bulge at C5-6 with superimposed ossification of the posterior longitudinal ligament, causing moderate central canal stenosis with possible mass effect on the anterior portion of the cervical cord. Additional mild degenerative hypertrophy of the facets and uncovertebral joints causing mild to moderate bilateral neural foramen stenoses at C3-4 through C5-6 with possible mass effect on the exiting nerve roots. Upper chest: Emphysematous blebs at the lung apices. No acute findings. Other: None. IMPRESSION: 1. No acute intracranial abnormality. No intracranial mass, hemorrhage or edema. No  skull fracture. 2. No fracture or acute subluxation within the cervical spine. 3. Degenerative change within the cervical spine, most prominent at C5-6 where there is a prominent disc-osteophytic bulge and superimposed ossification of the posterior longitudinal ligament, causing moderate central canal stenosis with possible mass effect on the anterior portion of the cervical cord. Emphysema (ICD10-J43.9). Electronically Signed   By: Franki Cabot M.D.   On: 05/14/2019 09:39   CT Cervical Spine Wo Contrast  Result Date: 05/14/2019 CLINICAL DATA:  Fall this morning, dizziness weakness. EXAM: CT HEAD WITHOUT CONTRAST CT CERVICAL SPINE WITHOUT CONTRAST TECHNIQUE: Multidetector CT imaging of the head and cervical spine was performed following the standard protocol without intravenous contrast. Multiplanar CT image reconstructions of the cervical spine were also generated. COMPARISON:  Cervical spine CT dated 04/22/2019 FINDINGS: CT HEAD FINDINGS Brain: Generalized parenchymal volume loss with commensurate dilatation of the ventricles and sulci. No mass,  hemorrhage, edema or other evidence of acute parenchymal abnormality. No extra-axial hemorrhage. Vascular: No hyperdense vessel or unexpected calcification. Skull: Normal. Negative for fracture or focal lesion. Sinuses/Orbits: No acute finding. Other: None. CT CERVICAL SPINE FINDINGS Alignment: Stable mild levoscoliosis. No evidence of acute vertebral body subluxation. Skull base and vertebrae: No fracture line or displaced fracture fragment identified. Facet joints are normally aligned. No acute or suspicious osseous lesion. Soft tissues and spinal canal: No prevertebral fluid or swelling. No visible canal hematoma. Disc levels: Prominent disc-osteophytic bulge at C5-6 with superimposed ossification of the posterior longitudinal ligament, causing moderate central canal stenosis with possible mass effect on the anterior portion of the cervical cord. Additional mild degenerative hypertrophy of the facets and uncovertebral joints causing mild to moderate bilateral neural foramen stenoses at C3-4 through C5-6 with possible mass effect on the exiting nerve roots. Upper chest: Emphysematous blebs at the lung apices. No acute findings. Other: None. IMPRESSION: 1. No acute intracranial abnormality. No intracranial mass, hemorrhage or edema. No skull fracture. 2. No fracture or acute subluxation within the cervical spine. 3. Degenerative change within the cervical spine, most prominent at C5-6 where there is a prominent disc-osteophytic bulge and superimposed ossification of the posterior longitudinal ligament, causing moderate central canal stenosis with possible mass effect on the anterior portion of the cervical cord. Emphysema (ICD10-J43.9). Electronically Signed   By: Franki Cabot M.D.   On: 05/14/2019 09:39   DG Chest Portable 1 View  Result Date: 05/14/2019 CLINICAL DATA:  Fall, dizziness, low back pain. EXAM: PORTABLE CHEST 1 VIEW COMPARISON:  Chest x-ray dated 09/15/2016. FINDINGS: Heart size and mediastinal  contours are within normal limits. Lungs are clear. No pleural effusion or pneumothorax is seen. Osseous structures about the chest are unremarkable. IMPRESSION: No acute findings. No evidence of pneumonia or pulmonary edema. Electronically Signed   By: Franki Cabot M.D.   On: 05/14/2019 10:03    ____________________________________________   PROCEDURES  Procedure(s) performed:   Procedures  None ____________________________________________   INITIAL IMPRESSION / ASSESSMENT AND PLAN / ED COURSE  Pertinent labs & imaging results that were available during my care of the patient were reviewed by me and considered in my medical decision making (see chart for details).   Patient presents to the emergency department for evaluation after multiple falls at home and worsening subjective numbness in both lower extremities.  He does report now having some groin numbness.  He has known cervical disc disease but no weakness/numbness in the upper extremities.  He has had multiple falls including this morning.  Plan for  MRI imaging of the thoracic and lumbar spines to evaluate for focal cord compression/cauda equina.  No infection symptoms.  EKG is reassuring and nonischemic.  Doubt cardiogenic syncope.  CT imaging of the head and cervical spine reviewed with read of osteophytic compression and possible cord compression.  Have added MRI C-spine with this finding.   Patient with AKI on labs. IVF given. Patient attempted MRI but became very anxious. Ativan given with no improvement. May require sedation as inpatient.   Discussed patient's case with IM teaching service to request admission. Patient and family (if present) updated with plan. Care transferred to Medicine service.  I reviewed all nursing notes, vitals, pertinent old records, EKGs, labs, imaging (as available).  ____________________________________________  FINAL CLINICAL IMPRESSION(S) / ED DIAGNOSES  Final diagnoses:  AKI (acute kidney  injury) ([place])  Acute midline low back pain without sciatica     MEDICATIONS GIVEN DURING THIS VISIT:  Medications  lactated ringers infusion ( Intravenous New Bag/Given 05/14/19 1835)  traMADol (ULTRAM) tablet 50 mg (has no administration in time range)  acetaminophen (TYLENOL) tablet 650 mg (has no administration in time range)  atorvastatin (LIPITOR) tablet 10 mg (10 mg Oral Given 05/14/19 1810)  ARIPiprazole (ABILIFY) tablet 5 mg (has no administration in time range)  buPROPion (WELLBUTRIN XL) 24 hr tablet 300 mg (has no administration in time range)  busPIRone (BUSPAR) tablet 15 mg (has no administration in time range)  QUEtiapine (SEROQUEL) tablet 400 mg (has no administration in time range)  pantoprazole (PROTONIX) EC tablet 40 mg (has no administration in time range)  gabapentin (NEURONTIN) capsule 300 mg (has no administration in time range)  tiZANidine (ZANAFLEX) tablet 4 mg (has no administration in time range)  insulin detemir (LEVEMIR) injection 30 Units (has no administration in time range)  insulin aspart (novoLOG) injection 0-15 Units (0 Units Subcutaneous Not Given 05/14/19 1726)  insulin aspart (novoLOG) injection 0-5 Units (has no administration in time range)  enoxaparin (LOVENOX) injection 30 mg (30 mg Subcutaneous Given 05/14/19 1810)  LORazepam (ATIVAN) injection 2 mg (2 mg Intravenous Not Given 05/14/19 1851)  divalproex (DEPAKOTE) DR tablet 1,500 mg (has no administration in time range)  traZODone (DESYREL) tablet 150 mg (has no administration in time range)  LORazepam (ATIVAN) injection 1 mg (1 mg Intramuscular Given 05/14/19 1028)  lactated ringers bolus 500 mL (0 mLs Intravenous Stopped 05/14/19 1226)     Note:  This document was prepared using Dragon voice recognition software and may include unintentional dictation errors.  Nanda Quinton, MD, [facility] Emergency Medicine    Darleth Eustache, [facility], MD 05/14/19 Drema Halon

## 2019-05-14 NOTE — ED Notes (Signed)
Lab called to inform cbc clotted, needs to be recollected

## 2019-05-14 NOTE — H&P (Signed)
Date: 05/14/2019               Patient Name:  [name] MRN: [id number]  DOB: 03/18/1966 Age / Sex: 54 y.o., male   PCP: [name], MD         Medical Service: Internal Medicine Teaching Service         Attending Physician: Dr. Sid Falcon, MD    First Contact: Dr. Charleen Kirks Pager: [phone]  Second Contact: Dr. Koleen Distance Pager: [phone]       After Hours (After 5p/  First Contact Pager: [phone]  weekends / holidays): Second Contact Pager: [phone]   Chief Complaint: Fall  History of Present Illness:   Mr. Alden Bensinger is a 54 y/o male with a PMHx of chronic back pain 2/2 degenerative disc disease, early onset dementia, T2DM with neuropathy, CKD, HTN, GERD who presents to the ED after a fall at home. History limited by poor historian. Family not at bedside.   Mr. Kendrix reports that he's been falling more often lately, and they have been getting worse. He is unable to describe how it is getting worse though. His most recent fall was this morning at 1:00 AM when he bumped into something. He was able to catch himself with his right arm. He denies any LOC or head trauma. After the fall, his chronic back pain acutely worsened, in addition to developing headache. He denies any part of his back hurting worse than the rest. He denies dizziness. He was able to stand back up and call the ambulance to take him to the ED.   Mr Desantis notes he has not been drinking water or eating as regularly at home, but that is his baseline.   ED Course:  CT head negative for acute intracranial abnormalities. CT cervical positive for prominent disc-osteophyte bulge at C5-6 with possible mass effect. Patient was unable to tolerate MRI of spine. BMP notable for mild AKI with BUN of 58 and creatinine of 2.53 (baseline 2.0).  Meds:  No current facility-administered medications on file prior to encounter.   Current Outpatient Medications on File Prior to Encounter  Medication Sig Dispense Refill    . acetaminophen (TYLENOL) 325 MG tablet Take 2 tablets (650 mg total) by mouth every 6 (six) hours as needed for mild pain (or Fever >/= 101).    . ARIPiprazole (ABILIFY) 5 MG tablet TAKE 1 TABLET BY MOUTH DAILY FOR DEPRESSION (Patient taking differently: Take 5 mg by mouth daily. ) 30 tablet 0  . atorvastatin (LIPITOR) 10 MG tablet TAKE 1 TABLET(10 MG) BY MOUTH DAILY 90 tablet 0  . B-D UF III MINI PEN NEEDLES 31G X 5 MM MISC USE FOUR TIMES DAILY 100 each 12  . buPROPion (WELLBUTRIN XL) 300 MG 24 hr tablet Take 1 tablet (300 mg total) by mouth daily. 30 tablet 0  . busPIRone (BUSPAR) 15 MG tablet Take 15 mg by mouth 3 (three) times daily.    . CHANTIX 1 MG tablet TAKE 1 TABLET(1 MG) BY MOUTH TWICE DAILY 60 tablet 2  . cyanocobalamin 500 MCG tablet Take 1 tablet (500 mcg total) by mouth daily. 30 tablet 0  . diclofenac sodium (VOLTAREN) 1 % GEL Apply 4 g topically 4 (four) times daily. 100 g 1  . divalproex (DEPAKOTE) 500 MG DR tablet Take 3 tablets (1,500 mg total) by mouth at bedtime. 90 tablet 0  . feeding supplement, ENSURE ENLIVE, (ENSURE ENLIVE) LIQD Take 237 mLs by mouth 2 (  two) times daily between meals. 237 mL 12  . fluticasone (FLONASE) 50 MCG/ACT nasal spray SHAKE LIQUID AND USE 2 SPRAYS IN EACH NOSTRIL DAILY 16 g 2  . gabapentin (NEURONTIN) 300 MG capsule Take 1 capsule (300 mg total) by mouth 3 (three) times daily. 60 capsule 6  . glucose blood (ACCU-CHEK GUIDE) test strip CHECK SUGAR FOUR TIMES DAILY 100 each 12  . insulin aspart (NOVOLOG FLEXPEN) 100 UNIT/ML FlexPen 0 to 12 units subcutaneously 3 times daily as per sliding scale 15 mL 11  . Insulin Detemir (LEVEMIR FLEXTOUCH) 100 UNIT/ML Pen Inject twice daily subcutaneously 38units in the morning and 38 units in the evening 15 mL 6  . lidocaine (LIDODERM) 5 % Place 1 patch onto the skin daily. Remove & Discard patch within 12 hours or as directed by MD 30 patch 0  . linagliptin (TRADJENTA) 5 MG TABS tablet Take 1 tablet (5 mg total)  by mouth daily. 30 tablet 6  . lisinopril-hydrochlorothiazide (ZESTORETIC) 10-12.5 MG tablet Take 1 tablet by mouth daily. 30 tablet 6  . methocarbamol (ROBAXIN) 500 MG tablet Take 2 tablets (1,000 mg total) by mouth 4 (four) times daily. 120 tablet 6  . omeprazole (PRILOSEC) 40 MG capsule TAKE 1 CAPSULE(40 MG) BY MOUTH DAILY 30 capsule 2  . predniSONE (DELTASONE) 50 MG tablet Take 1 tablet (50 mg total) by mouth daily with breakfast. (Patient not taking: Reported on 05/06/2019) 5 tablet 0  . QUEtiapine (SEROQUEL) 400 MG tablet Take 1 tablet (400 mg total) by mouth at bedtime. 30 tablet 11  . tadalafil (CIALIS) 5 MG tablet TAKE ONE TABLET BY MOUTH EVERY DAY FOR BPH (Patient not taking: Reported on 05/06/2019) 30 tablet 2  . Testosterone (ANDROGEL) 20.25 MG/1.25GM (1.62%) GEL Apply 20.25 mg topically every morning. Apply topically under arm pit alternating with each application. 1.25 g 3  . tiZANidine (ZANAFLEX) 4 MG capsule Take 1 capsule (4 mg total) by mouth 3 (three) times daily. 20 capsule 0  . traMADol (ULTRAM) 50 MG tablet Take 1 tablet (50 mg total) by mouth at bedtime. 30 tablet 2  . traZODone (DESYREL) 150 MG tablet Take 1 tablet (150 mg total) by mouth at bedtime. 30 tablet 1   Allergies: Allergies as of 05/14/2019 - Review Complete 05/14/2019  Allergen Reaction Noted  . Vicodin [hydrocodone-acetaminophen] Itching 10/03/2015   Past Medical History:  Diagnosis Date  . ADD (attention deficit disorder)   . Anxiety   . Arthritis    right hip  . Bipolar 1 disorder ([place])   . Blood in urine   . CKD (chronic kidney disease), stage III   . Creatinine elevation   . Dementia ([place])    "early onset" (08/04/2017)  . Depression    bipolar [facility]  . Diabetes mellitus without complication ([place])   . Family history of anesthesia complication    pt is unsure , but pt father may have been difficult to arouse   . HCAP (healthcare-associated pneumonia) 10/31/2012  . History of kidney  stones   . Hypertension   . Hypogonadism male   . Liver fatty degeneration   . Microscopic hematuria    hereditary s/p Urology eval  . Osteoarthritis of right hip 11/28/2011   2012 2015 s/p THR Severe  Dr Novella Olive    . Pleural effusion 11/02/2012  . Pneumonia 10-2012  . Pneumonia, organism unspecified(486) 11/02/2012  . Polysubstance dependence, non-opioid, in remission ([place])    remote  . Primary osteoarthritis of left hip  05/22/2015  . PTSD (post-traumatic stress disorder)    SOCIAL ANXIETY DISORDER   . Suicide attempt by multiple drug overdose ([place]) 2016/01/14   Grieving his cat's death 07-31-15   Family History:  Family History  Problem Relation Age of Onset  . Diabetes Father   . Cancer Mother        died of melanoma with mets  . Cervical cancer Sister   . Diabetes Sister   . Other Neg Hx        hypogonadism   Social History:  Denies alcohol, drug or tobacco use Lives at home alone  Per chart review, his sister helps him often  Review of Systems: A complete ROS was negative except as per HPI.   Physical Exam: Blood pressure 112/70, pulse 75, temperature 98.5 F (36.9 C), temperature source Oral, resp. rate 12, height 6\' 5"  (1.956 m), weight 88.9 kg, SpO2 100 %.  Physical Exam Vitals and nursing note reviewed.  Constitutional:      General: He is not in acute distress.    Appearance: He is normal weight. He is not ill-appearing or toxic-appearing.  HENT:     Head: Normocephalic and atraumatic.  Eyes:     Extraocular Movements: Extraocular movements intact.     Conjunctiva/sclera: Conjunctivae normal.  Pulmonary:     Effort: Pulmonary effort is normal. No respiratory distress.  Neurological:     General: No focal deficit present.     Mental Status: He is alert and oriented to person, place, and time.     Cranial Nerves: No dysarthria or facial asymmetry.     Sensory: Sensation is intact. No sensory deficit.     Motor: Tremor (chronic and at baseline) present. No  weakness.     Comments: Able to stand  Psychiatric:        Attention and Perception: He is inattentive.        Mood and Affect: Mood normal.        Speech: Speech is delayed.        Behavior: Behavior is cooperative.        Cognition and Memory: Cognition is impaired. Memory is impaired.     Comments: Pt has difficulty answering some questions. He has a history of chronic disorientation.     EKG: personally reviewed my interpretation is: Low voltage EKG. Sinus rhythm with rate of 80. No axis deviation. No ST elevation  CXR: personally reviewed my interpretation is: No pleural effusions or opacities noted.   Assessment & Plan by Problem: Active Problems:   AKI (acute kidney injury) ([place])  # Acute Kidney Injury:  Likely due to dehydration, as patient endorses decreased PO intake recently.   - UA - BMP in the AM - IVF: LR @ 125 cc/hr for 24 hours   # Ground Level Fall # Acute on Chronic Back Pain  Patient has a complicated spinal history with increasing frequency of falls recently, last ED visit for fall being 3 weeks ago. CT cervical today was concerning for prominent disc-osteophytic bulge at C5-6 with superimposed ossification of the posterior longitudinal ligament, causing moderate central canal stenosis with possible mass effect on the anterior portion of the cervical cord. CT cervical from 1/02 showed similar findings, but did not note on mass effect or ligament involvement. MRI was attempted to be obtained but patient unable to tolerate. Will try again with higher dose of sedation. No evidence of cauda equina on examination, as patient has intact sensation and strength, equal bilaterally.  Likely will need outpatient follow up with orthopedics, for which he has already been referred by his PCP.   - MRI cervical, thoracic, lumbar wo contrast  - Ativan 2 mg prior  - Tramadol 50 mg at bedtime  - Tizanidine 4 mg TID  # Type 2 Diabetes Mellitus  A1c of 8.3%. At home, he is on  Tradjenta, Levemir 38 units BID and sliding scale with Novolog. Based on his a1c today, he is stable on current regimen.   - Levemir 35 untis - SSI  # ? Of Dementia  Patient appears at baseline today but continues to have some memory and cognition deficiencies. Per chart review, he did follow up with Neurology who felt memory/cognition was affected by patient's psychiatric history, not early onset dementia. He has not followed up since. Will recommend doing so to re-evaluate.   # Tremors:  Tremors noted on examination, which patient states are at his baseline. Per chart review, Neurology in 2018 felt they were likely from Depakote. He was reassured at that time that it is not from Parkinson's. Patient continues to take Depakote daily.    Code: FULL Diet: Carb modified DVT ppx: Lovenox  Dispo: Admit patient to Observation with expected length of stay less than 2 midnights.  Signed: Dr. Jose Persia Internal Medicine PGY-1  Pager: [phone] 05/14/2019, 1:35 PM

## 2019-05-14 NOTE — ED Notes (Signed)
This nurse received care of pt, pt had urinated all over the floor, pulled out IV, taken himself off all equipment. Encouragement and support provided. Will continue to monitor.

## 2019-05-15 DIAGNOSIS — M25532 Pain in left wrist: Secondary | ICD-10-CM | POA: Diagnosis present

## 2019-05-15 DIAGNOSIS — M545 Low back pain: Secondary | ICD-10-CM | POA: Diagnosis present

## 2019-05-15 DIAGNOSIS — E119 Type 2 diabetes mellitus without complications: Secondary | ICD-10-CM | POA: Diagnosis not present

## 2019-05-15 DIAGNOSIS — N1831 Chronic kidney disease, stage 3a: Secondary | ICD-10-CM | POA: Diagnosis present

## 2019-05-15 DIAGNOSIS — Z20822 Contact with and (suspected) exposure to covid-19: Secondary | ICD-10-CM | POA: Diagnosis present

## 2019-05-15 DIAGNOSIS — Z79899 Other long term (current) drug therapy: Secondary | ICD-10-CM

## 2019-05-15 DIAGNOSIS — E785 Hyperlipidemia, unspecified: Secondary | ICD-10-CM | POA: Diagnosis present

## 2019-05-15 DIAGNOSIS — G8929 Other chronic pain: Secondary | ICD-10-CM

## 2019-05-15 DIAGNOSIS — W1830XA Fall on same level, unspecified, initial encounter: Secondary | ICD-10-CM | POA: Diagnosis present

## 2019-05-15 DIAGNOSIS — D61818 Other pancytopenia: Secondary | ICD-10-CM | POA: Diagnosis not present

## 2019-05-15 DIAGNOSIS — I129 Hypertensive chronic kidney disease with stage 1 through stage 4 chronic kidney disease, or unspecified chronic kidney disease: Secondary | ICD-10-CM | POA: Diagnosis present

## 2019-05-15 DIAGNOSIS — E1122 Type 2 diabetes mellitus with diabetic chronic kidney disease: Secondary | ICD-10-CM | POA: Diagnosis present

## 2019-05-15 DIAGNOSIS — N179 Acute kidney failure, unspecified: Secondary | ICD-10-CM | POA: Diagnosis present

## 2019-05-15 DIAGNOSIS — N400 Enlarged prostate without lower urinary tract symptoms: Secondary | ICD-10-CM | POA: Diagnosis present

## 2019-05-15 DIAGNOSIS — E1140 Type 2 diabetes mellitus with diabetic neuropathy, unspecified: Secondary | ICD-10-CM | POA: Diagnosis present

## 2019-05-15 DIAGNOSIS — E860 Dehydration: Secondary | ICD-10-CM | POA: Diagnosis present

## 2019-05-15 DIAGNOSIS — F319 Bipolar disorder, unspecified: Secondary | ICD-10-CM | POA: Diagnosis present

## 2019-05-15 DIAGNOSIS — Z87442 Personal history of urinary calculi: Secondary | ICD-10-CM | POA: Diagnosis not present

## 2019-05-15 DIAGNOSIS — R519 Headache, unspecified: Secondary | ICD-10-CM | POA: Diagnosis present

## 2019-05-15 DIAGNOSIS — R296 Repeated falls: Secondary | ICD-10-CM | POA: Diagnosis present

## 2019-05-15 DIAGNOSIS — F0280 Dementia in other diseases classified elsewhere without behavioral disturbance: Secondary | ICD-10-CM | POA: Diagnosis present

## 2019-05-15 DIAGNOSIS — Z794 Long term (current) use of insulin: Secondary | ICD-10-CM

## 2019-05-15 DIAGNOSIS — D649 Anemia, unspecified: Secondary | ICD-10-CM | POA: Diagnosis present

## 2019-05-15 DIAGNOSIS — M549 Dorsalgia, unspecified: Secondary | ICD-10-CM

## 2019-05-15 DIAGNOSIS — F988 Other specified behavioral and emotional disorders with onset usually occurring in childhood and adolescence: Secondary | ICD-10-CM | POA: Diagnosis present

## 2019-05-15 DIAGNOSIS — K219 Gastro-esophageal reflux disease without esophagitis: Secondary | ICD-10-CM | POA: Diagnosis present

## 2019-05-15 DIAGNOSIS — Z915 Personal history of self-harm: Secondary | ICD-10-CM | POA: Diagnosis not present

## 2019-05-15 DIAGNOSIS — Z79891 Long term (current) use of opiate analgesic: Secondary | ICD-10-CM

## 2019-05-15 DIAGNOSIS — F4310 Post-traumatic stress disorder, unspecified: Secondary | ICD-10-CM | POA: Diagnosis present

## 2019-05-15 DIAGNOSIS — M25552 Pain in left hip: Secondary | ICD-10-CM

## 2019-05-15 DIAGNOSIS — R32 Unspecified urinary incontinence: Secondary | ICD-10-CM | POA: Diagnosis present

## 2019-05-15 LAB — BASIC METABOLIC PANEL
Anion gap: 7 (ref 5–15)
BUN: 40 mg/dL — ABNORMAL HIGH (ref 6–20)
CO2: 28 mmol/L (ref 22–32)
Calcium: 9.4 mg/dL (ref 8.9–10.3)
Chloride: 100 mmol/L (ref 98–111)
Creatinine, Ser: 1.73 mg/dL — ABNORMAL HIGH (ref 0.61–1.24)
GFR calc Af Amer: 52 mL/min — ABNORMAL LOW (ref >60)
GFR calc non Af Amer: 45 mL/min — ABNORMAL LOW (ref >60)
Glucose, Bld: 123 mg/dL — ABNORMAL HIGH (ref 70–99)
Potassium: 4.5 mmol/L (ref 3.5–5.1)
Sodium: 135 mmol/L (ref 135–145)

## 2019-05-15 LAB — CBC
HCT: 35.6 % — ABNORMAL LOW (ref 39.0–52.0)
Hemoglobin: 12.1 g/dL — ABNORMAL LOW (ref 13.0–17.0)
MCH: 30.2 pg (ref 26.0–34.0)
MCHC: 34 g/dL (ref 30.0–36.0)
MCV: 88.8 fL (ref 80.0–100.0)
Platelets: 55 10*3/uL — ABNORMAL LOW (ref 150–400)
RBC: 4.01 MIL/uL — ABNORMAL LOW (ref 4.22–5.81)
RDW: 11.9 % (ref 11.5–15.5)
WBC: 3.9 10*3/uL — ABNORMAL LOW (ref 4.0–10.5)
nRBC: 0 % (ref 0.0–0.2)

## 2019-05-15 LAB — GLUCOSE, CAPILLARY
Glucose-Capillary: 116 mg/dL — ABNORMAL HIGH (ref 70–99)
Glucose-Capillary: 132 mg/dL — ABNORMAL HIGH (ref 70–99)
Glucose-Capillary: 60 mg/dL — ABNORMAL LOW (ref 70–99)
Glucose-Capillary: 145 mg/dL — ABNORMAL HIGH (ref 70–99)
Glucose-Capillary: 163 mg/dL — ABNORMAL HIGH (ref 70–99)

## 2019-05-15 NOTE — Evaluation (Signed)
Occupational Therapy Evaluation Patient Details Name: [name] MRN: [id number] DOB: [birth year] Today's Date: 05/15/2019    History of Present Illness Patient is a 54 y/o male presenting to the ED after a fall at home. CT head negative for acute intracranial abnormalities. CT cervical positive for prominent disc-osteophyte bulge at C5-6 with possible mass effect. PMHx of chronic back pain 2/2 degenerative disc disease, early onset dementia, T2DM with neuropathy, CKD, HTN, GERD.    Clinical Impression   Pt. Needs assist to perform ADL and mobility. Pt. Is not safe to return home alone. Per pt. His sister is not able to provide additional care. Pt. Is an agreement with SNF or ALF. Acute OT to follow.     Follow Up Recommendations  SNF(alf)    Equipment Recommendations       Recommendations for Other Services       Precautions / Restrictions Precautions Precautions: Fall Restrictions Weight Bearing Restrictions: No      Mobility Bed Mobility Overal bed mobility: Needs Assistance Bed Mobility: Supine to Sit;Sit to Supine     Supine to sit: Min guard Sit to supine: Min guard   General bed mobility comments: for safety - cueing for sequencing  Transfers Overall transfer level: Needs assistance Equipment used: Rolling walker (2 wheeled);Ambulation equipment used Transfers: Squat Pivot Transfers     Squat pivot transfers: Min guard     General transfer comment: decreased balance    Balance                                           ADL either performed or assessed with clinical judgement   ADL Overall ADL's : Needs assistance/impaired Eating/Feeding: Independent   Grooming: Wash/dry hands;Wash/dry face;Standing;Supervision/safety   Upper Body Bathing: Set up;Supervision/ safety;Sitting   Lower Body Bathing: Minimal assistance;Sit to/from stand   Upper Body Dressing : Minimal assistance;Sitting   Lower Body Dressing: Moderate  assistance;Sit to/from stand   Toilet Transfer: Designer, fashion/clothing and Hygiene: Min guard       Functional mobility during ADLs: Min guard General ADL Comments: Pt. is needing assist  and requries further ed and training     Vision Baseline Vision/History: Wears glasses;Glaucoma Wears Glasses: At all times Patient Visual Report: (patient states he has not been to eye doctor in 3 years )       Perception     Praxis      Pertinent Vitals/Pain       Hand Dominance Right   Extremity/Trunk Assessment Upper Extremity Assessment Upper Extremity Assessment: Generalized weakness           Communication Communication Communication: No difficulties   Cognition Arousal/Alertness: Awake/alert Behavior During Therapy: WFL for tasks assessed/performed Overall Cognitive Status: Within Functional Limits for tasks assessed                                     General Comments       Exercises     Shoulder Instructions      Home Living Family/patient expects to be discharged to:: Private residence Living Arrangements: Alone Available Help at Discharge: Family;Available PRN/intermittently Type of Home: Apartment Home Access: Level entry     Home Layout: One level     Bathroom Shower/Tub: Hospital doctor  Toilet: Standard     Home Equipment: Walker - 2 wheels;Cane - single point;Shower seat;Grab bars - toilet;Grab bars - tub/shower   Additional Comments: Lives alone      Prior Functioning/Environment Level of Independence: Independent with assistive device(s);Needs assistance  Gait / Transfers Assistance Needed: use of SPC vs RW ADL's / Homemaking Assistance Needed: reports his sister comes over 1x/day to assist with meals            OT Problem List:        OT Treatment/Interventions: Self-care/ADL training;DME and/or AE instruction;Therapeutic activities    OT Goals(Current goals can be found in the  care plan section) Acute Rehab OT Goals Patient Stated Goal: get stronger OT Goal Formulation: With patient Time For Goal Achievement: 05/29/19 Potential to Achieve Goals: Good  OT Frequency: Min 2X/week   Barriers to D/C: Decreased caregiver support          Co-evaluation   Reason for Co-Treatment: Necessary to address cognition/behavior during functional activity PT goals addressed during session: Mobility/safety with mobility        AM-PAC OT "6 Clicks" Daily Activity     Outcome Measure Help from another person eating meals?: None Help from another person taking care of personal grooming?: A Little Help from another person toileting, which includes using toliet, bedpan, or urinal?: A Little Help from another person bathing (including washing, rinsing, drying)?: A Little Help from another person to put on and taking off regular upper body clothing?: A Little Help from another person to put on and taking off regular lower body clothing?: A Lot 6 Click Score: 18   End of Session Equipment Utilized During Treatment: Rolling walker  Activity Tolerance: Patient limited by fatigue Patient left: in bed;with call bell/phone within reach;with bed alarm set  OT Visit Diagnosis: Unsteadiness on feet (R26.81);Repeated falls (R29.6)                Time: 0962-8366 OT Time Calculation (min): 23 min Charges:  OT General Charges $OT Visit: 1 Visit OT Evaluation $OT Eval Moderate Complexity: 1 Mod  6 clicks  Lova Urbieta 05/15/2019, 12:14 PM

## 2019-05-15 NOTE — Evaluation (Signed)
Physical Therapy Evaluation Patient Details Name: [name] MRN: [id number] DOB: [birth year] Today's Date: 05/15/2019   History of Present Illness  Patient is a 54 y/o male presenting to the ED after a fall at home. CT head negative for acute intracranial abnormalities. CT cervical positive for prominent disc-osteophyte bulge at C5-6 with possible mass effect. PMHx of chronic back pain 2/2 degenerative disc disease, early onset dementia, T2DM with neuropathy, CKD, HTN, GERD.     Clinical Impression  Patient admitted with the above listed diagnosis. Patient agreeable to PT/OT treatment. Requires cueing throughout session for safety and sequencing. Requires use of RW for limited ambulation distance due to general fatigue. Min guard/Min A +2 throughout for safety. Reduced balance with gait with narrow. Feel as if patient would benefit from short term rehab to improve safe and independent functional mobility due to his history of falls and current fall risk. PT to continue to follow acutely.     Follow Up Recommendations SNF;Supervision/Assistance - 24 hour;Supervision for mobility/OOB    Equipment Recommendations  Other (comment)(defer)    Recommendations for Other Services       Precautions / Restrictions Precautions Precautions: Fall Restrictions Weight Bearing Restrictions: No      Mobility  Bed Mobility Overal bed mobility: Needs Assistance Bed Mobility: Supine to Sit;Sit to Supine     Supine to sit: Min guard Sit to supine: Min guard   General bed mobility comments: for safety - cueing for sequencing  Transfers Overall transfer level: Needs assistance Equipment used: Rolling walker (2 wheeled);Ambulation equipment used Transfers: Squat Pivot Transfers Sit to Stand: Min guard Stand pivot transfers: Min guard Squat pivot transfers: Min guard     General transfer comment: decreased balance  Ambulation/Gait Ambulation/Gait assistance: Min guard;Min assist;+2  physical assistance Gait Distance (Feet): 80 Feet Assistive device: Rolling walker (2 wheeled) Gait Pattern/deviations: Step-to pattern;Step-through pattern;Decreased stride length;Narrow base of support Gait velocity: decreased   General Gait Details: forward head with downward gaze - cueing to increase BOS  Stairs            Wheelchair Mobility    Modified Rankin (Stroke Patients Only)       Balance Overall balance assessment: Needs assistance Sitting-balance support: Feet supported Sitting balance-Leahy Scale: Good     Standing balance support: Bilateral upper extremity supported;During functional activity Standing balance-Leahy Scale: Poor                               Pertinent Vitals/Pain      Home Living Family/patient expects to be discharged to:: Private residence Living Arrangements: Alone Available Help at Discharge: Family;Available PRN/intermittently Type of Home: Apartment Home Access: Level entry     Home Layout: One level Home Equipment: Walker - 2 wheels;Cane - single point;Shower seat;Grab bars - toilet;Grab bars - tub/shower(bed rail)      Prior Function Level of Independence: Independent with assistive device(s);Needs assistance   Gait / Transfers Assistance Needed: use of SPC vs RW  ADL's / Homemaking Assistance Needed: reports his sister comes over 1x/day to assist with meals        Hand Dominance        Extremity/Trunk Assessment   Upper Extremity Assessment Upper Extremity Assessment: Generalized weakness    Lower Extremity Assessment Lower Extremity Assessment: Generalized weakness    Cervical / Trunk Assessment Cervical / Trunk Assessment: Normal;Kyphotic  Communication   Communication: No difficulties  Cognition Arousal/Alertness: Awake/alert Behavior  During Therapy: WFL for tasks assessed/performed Overall Cognitive Status: Within Functional Limits for tasks assessed                                         General Comments      Exercises     Assessment/Plan    PT Assessment Patient needs continued PT services  PT Problem List Decreased strength;Decreased activity tolerance;Decreased balance;Decreased mobility;Decreased knowledge of use of DME;Decreased safety awareness       PT Treatment Interventions DME instruction;Gait training;Functional mobility training;Therapeutic activities;Therapeutic exercise;Balance training;Neuromuscular re-education;Patient/family education    PT Goals (Current goals can be found in the Care Plan section)  Acute Rehab PT Goals Patient Stated Goal: improve walking PT Goal Formulation: With patient Time For Goal Achievement: 05/29/19 Potential to Achieve Goals: Good    Frequency Min 3X/week   Barriers to discharge Decreased caregiver support      Co-evaluation PT/OT/SLP Co-Evaluation/Treatment: Yes Reason for Co-Treatment: Necessary to address cognition/behavior during functional activity;For patient/therapist safety;To address functional/ADL transfers PT goals addressed during session: Mobility/safety with mobility;Balance;Proper use of DME         AM-PAC PT "6 Clicks" Mobility  Outcome Measure Help needed turning from your back to your side while in a flat bed without using bedrails?: None Help needed moving from lying on your back to sitting on the side of a flat bed without using bedrails?: None Help needed moving to and from a bed to a chair (including a wheelchair)?: A Little Help needed standing up from a chair using your arms (e.g., wheelchair or bedside chair)?: A Little Help needed to walk in hospital room?: A Little Help needed climbing 3-5 steps with a railing? : A Lot 6 Click Score: 19    End of Session Equipment Utilized During Treatment: Gait belt Activity Tolerance: Patient tolerated treatment well;Patient limited by fatigue Patient left: in bed;with call bell/phone within reach;with bed alarm  set Nurse Communication: Mobility status PT Visit Diagnosis: Unsteadiness on feet (R26.81);Other abnormalities of gait and mobility (R26.89);Repeated falls (R29.6);Muscle weakness (generalized) (M62.81);History of falling (Z91.81)    Time: 7622-6333 PT Time Calculation (min) (ACUTE ONLY): 24 min   Charges:   PT Evaluation $PT Eval Moderate Complexity: 1 Mod          Lanney Gins, PT, DPT Supplemental Physical Therapist 05/15/19 10:42 AM Pager: [phone] Office: [phone]

## 2019-05-15 NOTE — Plan of Care (Signed)
  Problem: Education: Goal: Knowledge of disease and its progression will improve Outcome: Progressing   Problem: Fluid Volume: Goal: Fluid volume balance will be maintained or improved Outcome: Progressing

## 2019-05-15 NOTE — Plan of Care (Signed)
Pt. Needs assist to perform ADL and mobility. Pt. Is not safe to return home alone. Per pt. His sister is not able to provide additional care. Pt. Is an agreement with SNF or ALF. Acute OT to follow.

## 2019-05-15 NOTE — Progress Notes (Signed)
   Subjective:   [name] is still feeling weak today, but was able to get up and walk some. His back feels improved compared to when [name] came in. Endorses numbness in hips and feet. [name] is interested in trying to get home health services out to his house.   Discussed plan to follow-up PT recommendations and work towards getting him back home. Patient called sister while we were in the room, and she is hopeful that [name] will be able to go to a rehab facility.   Objective:  Vital signs in last 24 hours: Vitals:   05/14/19 1730 05/14/19 1756 05/14/19 2130 05/15/19 0458  BP: 116/77 123/85 106/73 114/85  Pulse: 78 81 78 67  Resp: 18  18 16   Temp:   98.2 F (36.8 C) (!) 97.5 F (36.4 C)  TempSrc:      SpO2: 99% 99% 99% 100%  Weight:   88.9 kg   Height:        Physical Exam Vitals and nursing note reviewed.  Constitutional:      Appearance: [name] is normal weight.  Musculoskeletal:     Right lower leg: No edema.     Left lower leg: No edema.  Skin:    General: Skin is warm and dry.     Findings: No bruising or lesion.  Neurological:     Mental Status: [name] is alert and oriented to person, place, and time. Mental status is at baseline.     Sensory: No sensory deficit.     Motor: No weakness.    Assessment/Plan:  Active Problems:   AKI (acute kidney injury) ([place])  # Acute Kidney Injury:  Resolved this AM with creatinine improved to baseline.   # Ground Level Fall # Acute on Chronic Back Pain  Patient has a complicated spinal history with increasing frequency of falls recently, last ED visit for fall being 3 weeks ago. Negative for acute changes in neurological functioning, so will defer MRI at this time. Referral for outpatient orthopedics already arranged by PCP. Awaiting PT/OT recommendations, as likely patient will need some form of rehab for deconditioning.   - Tramadol 50 mg at bedtime  - Tizanidine 4 mg TID  # Type 2 Diabetes Mellitus  - Levemir 35 untis -  SSI  Dispo: Anticipated discharge in approximately 0-1 day(s) pending PT/OT evaluation.   Dr. Jose Persia Internal Medicine PGY-1  Pager: [phone] 05/15/2019, 6:39 AM

## 2019-05-15 NOTE — Progress Notes (Addendum)
  Date: 05/15/2019  Patient name: [name]  Medical record number: [id number]  Date of birth: [birth year]   I have seen and evaluated [name] and discussed their care with the Residency Team. Briefly,  [name] was admitted for frequent falls which were worsening at home, chronic back and hip pain and also ? Medication adherence per sister.  He was noted to also have some acute kidney injury due to decrease PO of liquids and food at home.  He is concerned because he ordered take out and he doesn't have his credit card this morning.  The team updated his sister as well.    Vitals:   05/15/19 0458 05/15/19 0924  BP: 114/85 112/78  Pulse: 67 71  Resp: 16 18  Temp: (!) 97.5 F (36.4 C) 98 F (36.7 C)  SpO2: 100% 100%   General: Awake, alert, no distress Eyes: Anicteric sclerae CV: RR, NR, no murmur Pulm: Breathing comfortably without wheezing MSK: Muscle tone is normal for age, no contractures or change in joints Neuro: He is grossly moving all extremities, he has equal strength in lower extremities but limited motion of the left leg due to pain.  He has normal sensation in the hands and 5/5 strength in the upper extremities.  He has good dexterity and is able to operate his phone, feed himself.  He has normal speech.  Psych: He exhibits a few odd behaviors and fixation on specific ideas (urgent need to call sister and get an ink pen) but is very pleasant and oriented to situation.   Assessment and Plan: I have seen and evaluated the patient as outlined above. I agree with the formulated Assessment and Plan as detailed in the residents' note, with the following changes:   1. AKI on CKD stage 3a - Resolved today.  Continue to monitor and ensure adequate PO intake.  He ate all of his breakfast and apparently ordered out food for lunch  2. Recurrent falls, chronic back pain - PT is recommending SNF, we will await OT notes and consult TOC for assistance once that occurs - Continue  home medications  Other issues per Dr. Noni Saupe note.   Sid Falcon, MD 1/24/20219:55 AM

## 2019-05-16 ENCOUNTER — Telehealth: Payer: Self-pay

## 2019-05-16 DIAGNOSIS — D61818 Other pancytopenia: Secondary | ICD-10-CM

## 2019-05-16 DIAGNOSIS — Z9181 History of falling: Secondary | ICD-10-CM

## 2019-05-16 DIAGNOSIS — E119 Type 2 diabetes mellitus without complications: Secondary | ICD-10-CM

## 2019-05-16 LAB — URINALYSIS, ROUTINE W REFLEX MICROSCOPIC
Bacteria, UA: NONE SEEN
Bilirubin Urine: NEGATIVE
Glucose, UA: >=500 mg/dL — AB
Hgb urine dipstick: NEGATIVE
Ketones, ur: 5 mg/dL — AB
Leukocytes,Ua: NEGATIVE
Nitrite: NEGATIVE
Protein, ur: NEGATIVE mg/dL
Specific Gravity, Urine: 1.016 (ref 1.005–1.030)
pH: 6 (ref 5.0–8.0)

## 2019-05-16 LAB — CBC
HCT: 34.8 % — ABNORMAL LOW (ref 39.0–52.0)
Hemoglobin: 11.8 g/dL — ABNORMAL LOW (ref 13.0–17.0)
MCH: 30.2 pg (ref 26.0–34.0)
MCHC: 33.9 g/dL (ref 30.0–36.0)
MCV: 89 fL (ref 80.0–100.0)
Platelets: 58 10*3/uL — ABNORMAL LOW (ref 150–400)
RBC: 3.91 MIL/uL — ABNORMAL LOW (ref 4.22–5.81)
RDW: 12 % (ref 11.5–15.5)
WBC: 3.8 10*3/uL — ABNORMAL LOW (ref 4.0–10.5)
nRBC: 0 % (ref 0.0–0.2)

## 2019-05-16 LAB — BASIC METABOLIC PANEL
Anion gap: 10 (ref 5–15)
BUN: 32 mg/dL — ABNORMAL HIGH (ref 6–20)
CO2: 28 mmol/L (ref 22–32)
Calcium: 9.1 mg/dL (ref 8.9–10.3)
Chloride: 100 mmol/L (ref 98–111)
Creatinine, Ser: 2.3 mg/dL — ABNORMAL HIGH (ref 0.61–1.24)
GFR calc Af Amer: 37 mL/min — ABNORMAL LOW (ref >60)
GFR calc non Af Amer: 32 mL/min — ABNORMAL LOW (ref >60)
Glucose, Bld: 118 mg/dL — ABNORMAL HIGH (ref 70–99)
Potassium: 4.5 mmol/L (ref 3.5–5.1)
Sodium: 138 mmol/L (ref 135–145)

## 2019-05-16 LAB — GLUCOSE, CAPILLARY
Glucose-Capillary: 116 mg/dL — ABNORMAL HIGH (ref 70–99)
Glucose-Capillary: 170 mg/dL — ABNORMAL HIGH (ref 70–99)
Glucose-Capillary: 168 mg/dL — ABNORMAL HIGH (ref 70–99)
Glucose-Capillary: 128 mg/dL — ABNORMAL HIGH (ref 70–99)

## 2019-05-16 MED ORDER — INSULIN DETEMIR 100 UNIT/ML ~~LOC~~ SOLN
28.0000 [IU] | Freq: Two times a day (BID) | SUBCUTANEOUS | Status: DC
  Administered 2019-05-16 – 2019-05-20 (×8): 28 [IU] via SUBCUTANEOUS
  Filled 2019-05-16 (×9): qty 0.28

## 2019-05-16 NOTE — Clinical Social Work Note (Signed)
Two bed offers received for patient: [facility] and [facility]. Contacted sister's preferences [facility] and [facility]. Contacted Levada Dy, admissions director at [facility] and she will review patient information and check insurance to ensure they accept and will let CSW know, Call made to Freda Munro, admissions Mudlogger at [facility] and message left.  Requested clinicals faxed to [place] MUST for PASRR determination. CSW will continue working on placement for patient.  Milessa Hogan Givens, MSW, LCSW Licensed Clinical Social Worker [place] [phone]

## 2019-05-16 NOTE — TOC Initial Note (Signed)
Transition of Care [facility] - Initial/Assessment Note    Patient Details  Name: [name] MRN: [id number] Date of Birth: 1965/12/29  [facility]) CM/SW Contact:    Sable Feil, LCSW Phone Number: 05/16/2019, 2:32 PM  Clinical Narrative:  With patient's permission, sister contacted and assessment completed. [name] expressed agreement with ST rehab and provided CSW with her facility preferences: [facility], [facility] (where patient has been before); [place]. [name] also talked with CSW regarding possible placement later in an ALF and this was discussed, along with advising sister about going to Medicare.gov to look into individual Assisted Living facilities. Sister advised that she will be informed regarding facility responses.  During the conversation [name] expressed concern regarding her brother's failing to take his medication as prescribed, even though she puts it in pill organizer, and his occasional confusion.               Expected Discharge Plan: Skilled Nursing Facility Barriers to Discharge: Ship broker, SNF Pending bed offer, Awaiting State Approval (PASRR)(lSubmitted for PASRR and requested documentation has to be sent to [facility] MUST; sister needs bed offers once availabile.)   Patient Goals and CMS Choice Patient states their goals for this hospitalization and ongoing recovery are:: Patient and sister, [name] agreeable to [place]  rehab CMS Medicare.gov Compare Post Acute Care list provided to:: Patient Represenative (must comment)(Sister informed about [email or web address]. She provided CSW with facility preferences) Choice offered to / list presented to : Sibling  Expected Discharge Plan and Services Expected Discharge Plan: [place] In-house Referral: Clinical Social Work Discharge Planning Services: Other - See comment(SNF consult) Post Acute Care Choice: [place] Living  arrangements for the past 2 months: Apartment                 DME Arranged: N/A DME Agency: NA       HH Arranged: NA [place] Agency: NA        Prior Living Arrangements/Services Living arrangements for the past 2 months: Apartment Lives with:: Self Patient language and need for interpreter reviewed:: No Do you feel safe going back to the place where you live?: No(Patient and sister agreeable to [place] rehab)      Need for Family Participation in Patient Care: Yes (Comment) Care giver support system in place?: No (comment) Current home services: DME Criminal Activity/Legal Involvement Pertinent to Current Situation/Hospitalization: No - Comment as needed  Activities of Daily Living      Permission Sought/Granted Permission sought to share information with : Family Supports(Patient gave Nurse case manager Wendi permission to call his sister regarding SNF placement) Permission granted to share information with : Yes, Verbal Permission Granted  Share Information with NAME: [name]     Permission granted to share info w Relationship: Sister/HCPOA  Permission granted to share info w Contact Information: [phone]  Emotional Assessment Appearance:: Appears stated age(Per visit with patient by [facility] Manya Silvas) Attitude/Demeanor/Rapport: Engaged(Per visit with patient by [facility] Manya Silvas) Affect (typically observed): Accepting(Per visit with patient by [facility] Manya Silvas) Orientation: : Oriented to Self, Oriented to Place, Oriented to  Time, Oriented to Situation Alcohol / Substance Use: Tobacco Use, Alcohol Use, Illicit Drugs(Per sister her brother has smoked in the past, quit and now smokes maybe 1 or 2 cigarettes per week, does not drink or use illicit drugs per sister) Psych Involvement: No (comment)  Admission diagnosis:  AKI (acute kidney injury) ([place]) [N17.9] Acute midline  low back pain without sciatica [M54.5] Recurrent falls [R29.6] Patient Active Problem List    Diagnosis Date Noted  . Recurrent falls 05/15/2019  . AKI (acute kidney injury) ([place]) 05/14/2019  . Closed displaced fracture of lateral malleolus of left fibula 08/02/2017  . BPH (benign prostatic hyperplasia) 07/24/2017  . Hemorrhoid 05/27/2017  . Shoulder arthritis 05/27/2017  . Diabetic neuropathy ([place]) 05/27/2017  . Degenerative disc disease, lumbar 02/04/2017  . Constipation   . History of seizures 09/20/2016  . Dementia, early onset with advanced brain atrophy for age 54/05/2016  . Acute lower UTI   . Urinary retention   . Acute metabolic encephalopathy 03/52/4818  . HLD (hyperlipidemia) 09/07/2016  . GERD (gastroesophageal reflux disease) 09/07/2016  . PTSD (post-traumatic stress disorder) 09/07/2016  . Acute encephalopathy 09/07/2016  . Fall 09/07/2016  . Chronic kidney disease   . Ataxia 10/14/2015  . Hypokalemia 10/03/2015  . Hemiparesis ([place])   . Dyslipidemia 05/31/2014  . Insomnia 11/11/2013  . Degenerative joint disease (DJD) of hip 08/16/2013  . Acute midline low back pain without sciatica 05/20/2011  . Erectile dysfunction 02/17/2011  . Constipation - functional 02/17/2011  . Diabetes type 2, uncontrolled ([place]) 11/06/2010  . Hypogonadism male 05/20/2010  . TOBACCO USER 05/20/2010  . Demoralization and apathy 05/20/2010  . Schizoaffective disorder, bipolar type ([place]) 01/18/2010  . Right shoulder pain 01/18/2010  . Anxiety state 12/05/2006  . Essential hypertension 12/05/2006   PCP:  [name], MD Pharmacy:   [facility] [phone] - [place], [place] - [address] [place] [address] [place]  [address] Phone: [phone] Fax: [phone]     Social Determinants of Health (SDOH) Interventions  No SDOH interventions needed at this time   Readmission Risk Interventions No flowsheet data found.

## 2019-05-16 NOTE — TOC Initial Note (Addendum)
Transition of Care [name]) - Initial/Assessment Note    Patient Details  Name: [name] MRN: [id number] Date of Birth: Feb 01, 1966  Transition of Care [name]) CM/SW Contact:    Jonathan Crews, RN Phone Number: [phone] 05/16/2019, 12:27 PM  Clinical Narrative:                 Spoke with patient at the bedside. PTA home alone. States that his sister is his HCPOA and gives permission to speak with her about transition plans. States that he does have a walker at home. Discussed recommendations for SNF - patient is agreeable stating that he has previously been in a SNF in [place]. Noted in chart review that patient has been to [facility] which is now [place]. Will fax out FL2 and therapy notes. TOC team following.  Expected Discharge Plan: Skilled Nursing Facility Barriers to Discharge: Continued Medical Work up   Patient Goals and CMS Choice Patient states their goals for this hospitalization and ongoing recovery are:: agreeable to rehab CMS Medicare.gov Compare Post Acute Care list provided to:: Patient Represenative (must comment)(sister who is HCPOA)    Expected Discharge Plan and Services Expected Discharge Plan: [place] In-house Referral: Clinical Social Work Discharge Planning Services: CM Consult Post Acute Care Choice: [place] arrangements for the past 2 months: Apartment                 DME Arranged: N/A DME Agency: NA       HH Arranged: NA [place] Agency: NA        Prior Living Arrangements/Services Living arrangements for the past 2 months: Apartment Lives with:: Self Patient language and need for interpreter reviewed:: Yes            Current home services: DME Criminal Activity/Legal Involvement Pertinent to Current Situation/Hospitalization: No - Comment as needed  Activities of Daily Living      Permission Sought/Granted Permission sought to share information with : Family  Supports Permission granted to share information with : Yes, Verbal Permission Granted  Share Information with NAME: [name]     Permission granted to share info w Relationship: sister/HCPOA  Permission granted to share info w Contact Information: [phone]  Emotional Assessment Appearance:: Appears stated age Attitude/Demeanor/Rapport: Engaged Affect (typically observed): Accepting Orientation: : Oriented to Self, Oriented to  Time, Oriented to Place, Oriented to Situation Alcohol / Substance Use: Not Applicable Psych Involvement: No (comment)  Admission diagnosis:  AKI (acute kidney injury) ([place]) [N17.9] Acute midline low back pain without sciatica [M54.5] Recurrent falls [R29.6] Patient Active Problem List   Diagnosis Date Noted  . Recurrent falls 05/15/2019  . AKI (acute kidney injury) ([place]) 05/14/2019  . Closed displaced fracture of lateral malleolus of left fibula 08/02/2017  . BPH (benign prostatic hyperplasia) 07/24/2017  . Hemorrhoid 05/27/2017  . Shoulder arthritis 05/27/2017  . Diabetic neuropathy ([place]) 05/27/2017  . Degenerative disc disease, lumbar 02/04/2017  . Constipation   . History of seizures 09/20/2016  . Dementia, early onset with advanced brain atrophy for age 43/05/2016  . Acute lower UTI   . Urinary retention   . Acute metabolic encephalopathy 16/04/930  . HLD (hyperlipidemia) 09/07/2016  . GERD (gastroesophageal reflux disease) 09/07/2016  . PTSD (post-traumatic stress disorder) 09/07/2016  . Acute encephalopathy 09/07/2016  . Fall 09/07/2016  . Chronic kidney disease   . Ataxia 10/14/2015  . Hypokalemia 10/03/2015  . Hemiparesis ([place])   . Dyslipidemia 05/31/2014  .  Insomnia 11/11/2013  . Degenerative joint disease (DJD) of hip 08/16/2013  . Acute midline low back pain without sciatica 05/20/2011  . Erectile dysfunction 02/17/2011  . Constipation - functional 02/17/2011  . Diabetes type 2, uncontrolled ([place]) 11/06/2010  .  Hypogonadism male 05/20/2010  . TOBACCO USER 05/20/2010  . Demoralization and apathy 05/20/2010  . Schizoaffective disorder, bipolar type ([place]) 01/18/2010  . Right shoulder pain 01/18/2010  . Anxiety state 12/05/2006  . Essential hypertension 12/05/2006   PCP:  Jonathan Rakes, MD Pharmacy:   [facility] [id number] - [place] - [address] [place] [address] [place] [address] Phone: [phone] Fax: [phone]     Social Determinants of Health (SDOH) Interventions    Readmission Risk Interventions No flowsheet data found.

## 2019-05-16 NOTE — Telephone Encounter (Signed)
Call received from [facility], [place] noting that they tried to see that patient this weekend but determined that he was hospitalized

## 2019-05-16 NOTE — Progress Notes (Signed)
Inpatient Diabetes Program Recommendations  AACE/ADA: New Consensus Statement on Inpatient Glycemic Control (2015)  Target Ranges:  Prepandial:   less than 140 mg/dL      Peak postprandial:   less than 180 mg/dL (1-2 hours)      Critically ill patients:  140 - 180 mg/dL   Lab Results  Component Value Date   GLUCAP 116 (H) 05/16/2019   HGBA1C 8.3 (H) 05/14/2019    Review of Glycemic Control Results for [name], [name] (MRN [id number]) as of 05/16/2019 10:28  Ref. Range 05/15/2019 16:59 05/15/2019 18:34 05/15/2019 20:27 05/16/2019 06:50  Glucose-Capillary Latest Ref Range: 70 - 99 mg/dL 60 (L) 145 (H) 163 (H) 116 (H)   Diabetes history: Type 2 DM Outpatient Diabetes medications: Novolog 0-12 units TID, Levemir 39 units BID, Tradjenta 5 mg QD Current orders for Inpatient glycemic control: Levemir 30 units BID, Novolog 0-15 units TID, Novolog 0-5 units QHS.    Inpatient Diabetes Program Recommendations:    Noted mild hypoglycemia yesterday of 60 mg/dL. With current trends and renal function this AM, Consider decreasing Levemir to 24 units BID.   Thanks, Bronson Curb, MSN, RNC-OB Diabetes Coordinator [phone] (8a-5p)

## 2019-05-16 NOTE — Progress Notes (Signed)
   Subjective:   [name] is doing well this AM. He is noting some bilateral knee cracking this AM. This does occur at home but seems to be more often since admission. No other complaints at this time.   Discussed plan to discharge to SNF with long term plan being ALF. He is in agreement.   Objective:  Vital signs in last 24 hours: Vitals:   05/15/19 0924 05/15/19 1703 05/15/19 2027 05/16/19 0456  BP: 112/78 134/86 106/67 123/81  Pulse: 71 79 70 70  Resp: 18 18 18 18   Temp: 98 F (36.7 C) 97.7 F (36.5 C) 98.3 F (36.8 C) 97.8 F (36.6 C)  TempSrc: Oral Oral  Oral  SpO2: 100% 100% 100% 99%  Weight:   88.9 kg   Height:        Physical Exam Vitals and nursing note reviewed.  Constitutional:      General: He is not in acute distress.    Appearance: He is normal weight.  Pulmonary:     Effort: Pulmonary effort is normal. No respiratory distress.  Musculoskeletal:     Right knee: Normal. No swelling, bony tenderness or crepitus.     Left knee: Normal. No swelling, bony tenderness or crepitus.  Skin:    General: Skin is warm and dry.  Neurological:     General: No focal deficit present.     Mental Status: He is alert and oriented to person, place, and time. Mental status is at baseline.  Psychiatric:        Mood and Affect: Mood normal.        Behavior: Behavior normal.    Assessment/Plan:  Active Problems:   Acute midline low back pain without sciatica   AKI (acute kidney injury) ([place])   Recurrent falls  # Ground Level Fall # Acute on Chronic Back Pain Today, will continue with current pain management regimen. No new neurological complaints.   - Tramadol 50 mg at bedtime  - Tizanidine 4 mg TID  # Type 2 Diabetes Mellitus - Levemir 28 untis - SSI  # Pancytopenia  History of thrombocytopenia in the past dating back to 2017. Today, platelets are 58, which is the lowest he's been per chart review. Anemia appears to fluctuate but intermittent anemia  dating back at least to 2014. Leukopenia appears new but only present for 48 hours now. Overall, likely a chronic issue and will need outpatient follow up for this. Will check depakote levels and CMP.   - CBC, depakote level, CMP tomorrow AM   Dispo: Anticipated discharge pending SNF placement.   Dr. Jose Persia Internal Medicine PGY-1  Pager: [phone] 05/16/2019, 6:44 AM

## 2019-05-16 NOTE — NC FL2 (Signed)
[place] LEVEL OF CARE SCREENING TOOL     IDENTIFICATION  Patient Name: [name] Birthdate: December 11, 1965 Sex: male Admission Date (Current Location): 05/14/2019  [place] and [place] Number:  Kathleen Argue [id number] [place] and Address:  [facility], [place] [address] [place], [place] [address]      Provider Number: [id number]  Attending Physician Name and Address:  Axel Filler, *  Relative Name and Phone Number:  Vonna Kotyk - sister; [phone]    Current Level of Care: Hospital Recommended Level of Care: [place] Prior Approval Number:    Date Approved/Denied:   PASRR Number: (PASRR number pending)  Discharge Plan: SNF    Current Diagnoses: Patient Active Problem List   Diagnosis Date Noted  . Recurrent falls 05/15/2019  . AKI (acute kidney injury) ([place]) 05/14/2019  . Closed displaced fracture of lateral malleolus of left fibula 08/02/2017  . BPH (benign prostatic hyperplasia) 07/24/2017  . Hemorrhoid 05/27/2017  . Shoulder arthritis 05/27/2017  . Diabetic neuropathy ([place]) 05/27/2017  . Degenerative disc disease, lumbar 02/04/2017  . Constipation   . History of seizures 09/20/2016  . Dementia, early onset with advanced brain atrophy for age 50/05/2016  . Acute lower UTI   . Urinary retention   . Acute metabolic encephalopathy 61/60/7371  . HLD (hyperlipidemia) 09/07/2016  . GERD (gastroesophageal reflux disease) 09/07/2016  . PTSD (post-traumatic stress disorder) 09/07/2016  . Acute encephalopathy 09/07/2016  . Fall 09/07/2016  . Chronic kidney disease   . Ataxia 10/14/2015  . Hypokalemia 10/03/2015  . Hemiparesis ([place])   . Dyslipidemia 05/31/2014  . Insomnia 11/11/2013  . Degenerative joint disease (DJD) of hip 08/16/2013  . Acute midline low back pain without sciatica 05/20/2011  . Erectile dysfunction 02/17/2011  . Constipation - functional 02/17/2011  . Diabetes type 2,  uncontrolled ([place]) 11/06/2010  . Hypogonadism male 05/20/2010  . TOBACCO USER 05/20/2010  . Demoralization and apathy 05/20/2010  . Schizoaffective disorder, bipolar type ([place]) 01/18/2010  . Right shoulder pain 01/18/2010  . Anxiety state 12/05/2006  . Essential hypertension 12/05/2006    Orientation RESPIRATION BLADDER Height & Weight     Self, Time, Situation, Place  Normal Continent Weight: 195 lb 15.8 oz (88.9 kg) Height:  6\' 5"  (195.6 cm)  BEHAVIORAL SYMPTOMS/MOOD NEUROLOGICAL BOWEL NUTRITION STATUS      Continent Diet(Carb modifed, thin liquids)  AMBULATORY STATUS COMMUNICATION OF NEEDS Skin   Limited Assist(Min guard/min assist) Verbally Skin abrasions(Right head)                       Personal Care Assistance Level of Assistance  Bathing, Feeding, Dressing Bathing Assistance: Limited assistance(Upper body Set-up/min. assist; Lower body sup. for safety) Feeding assistance: Independent Dressing Assistance: Maximum assistance(Upper body min assist/Lower body mod assist)     Functional Limitations Info  Sight, Hearing, Speech Sight Info: Impaired(Wears glasses for certain things per sister) Hearing Info: Adequate Speech Info: Adequate    SPECIAL CARE FACTORS FREQUENCY  PT (By licensed PT), OT (By licensed OT)     PT Frequency: Evaluated 1/24 in hospital; PT at SNF Eval and Treat, a minimum of 5 days per week OT Frequency: Evaluated 1/4 in hospital. OT at SNF Eval and Treat, a minimum of 5 days per week            Contractures Contractures Info: Not present    Additional Factors Info  Code Status, Allergies, Insulin Sliding Scale Code Status Info:  Full Allergies Info: Vicodin   Insulin Sliding Scale Info: 0-5 Units daily at bedtime; 0-15 Units 3 times per day with meals       Current Medications (05/16/2019):  This is the current hospital active medication list Current Facility-Administered Medications  Medication Dose Route Frequency Provider Last  Rate Last Admin  . acetaminophen (TYLENOL) tablet 650 mg  650 mg Oral Q6H PRN Bloomfield, Carley D, DO      . ARIPiprazole (ABILIFY) tablet 5 mg  5 mg Oral Daily Bloomfield, Carley D, DO   5 mg at 05/16/19 0902  . atorvastatin (LIPITOR) tablet 10 mg  10 mg Oral q1800 Bloomfield, Carley D, DO   10 mg at 05/15/19 1729  . buPROPion (WELLBUTRIN XL) 24 hr tablet 300 mg  300 mg Oral Daily Bloomfield, Carley D, DO   300 mg at 05/16/19 0902  . busPIRone (BUSPAR) tablet 15 mg  15 mg Oral TID Bloomfield, Carley D, DO   15 mg at 05/16/19 0902  . divalproex (DEPAKOTE) DR tablet 1,500 mg  1,500 mg Oral QHS Bloomfield, Carley D, DO   1,500 mg at 05/15/19 2115  . enoxaparin (LOVENOX) injection 30 mg  30 mg Subcutaneous Q24H Bloomfield, Carley D, DO   30 mg at 05/15/19 1732  . gabapentin (NEURONTIN) capsule 300 mg  300 mg Oral BID Bloomfield, Carley D, DO   300 mg at 05/16/19 0902  . insulin aspart (novoLOG) injection 0-15 Units  0-15 Units Subcutaneous TID WC Bloomfield, Carley D, DO   3 Units at 05/16/19 1208  . insulin aspart (novoLOG) injection 0-5 Units  0-5 Units Subcutaneous QHS Bloomfield, Carley D, DO      . insulin detemir (LEVEMIR) injection 28 Units  28 Units Subcutaneous BID Jose Persia, MD      . pantoprazole (PROTONIX) EC tablet 40 mg  40 mg Oral Daily Bloomfield, Carley D, DO   40 mg at 05/16/19 0902  . QUEtiapine (SEROQUEL) tablet 400 mg  400 mg Oral QHS Bloomfield, Carley D, DO   400 mg at 05/15/19 2115  . tiZANidine (ZANAFLEX) tablet 4 mg  4 mg Oral TID Bloomfield, Carley D, DO   4 mg at 05/16/19 0902  . traMADol (ULTRAM) tablet 50 mg  50 mg Oral QHS Bloomfield, Carley D, DO   50 mg at 05/15/19 2115  . traZODone (DESYREL) tablet 150 mg  150 mg Oral QHS Bloomfield, Carley D, DO   150 mg at 05/15/19 2114     Discharge Medications: Please see discharge summary for a list of discharge medications.  Relevant Imaging Results:  Relevant Lab Results:   Additional  Information [id number]  Sable Feil, LCSW

## 2019-05-16 NOTE — Plan of Care (Signed)
  Problem: Clinical Measurements: Goal: Dialysis access will remain free of complications Outcome: Not Applicable

## 2019-05-17 ENCOUNTER — Telehealth: Payer: Self-pay

## 2019-05-17 ENCOUNTER — Encounter (HOSPITAL_COMMUNITY): Payer: Self-pay | Admitting: Internal Medicine

## 2019-05-17 DIAGNOSIS — M25532 Pain in left wrist: Secondary | ICD-10-CM

## 2019-05-17 LAB — CBC WITH DIFFERENTIAL/PLATELET
Abs Immature Granulocytes: 0.02 10*3/uL (ref 0.00–0.07)
Basophils Absolute: 0 10*3/uL (ref 0.0–0.1)
Basophils Relative: 1 %
Eosinophils Absolute: 0 10*3/uL (ref 0.0–0.5)
Eosinophils Relative: 1 %
HCT: 33.1 % — ABNORMAL LOW (ref 39.0–52.0)
Hemoglobin: 11.4 g/dL — ABNORMAL LOW (ref 13.0–17.0)
Immature Granulocytes: 1 %
Lymphocytes Relative: 52 %
Lymphs Abs: 1.9 10*3/uL (ref 0.7–4.0)
MCH: 30.6 pg (ref 26.0–34.0)
MCHC: 34.4 g/dL (ref 30.0–36.0)
MCV: 88.7 fL (ref 80.0–100.0)
Monocytes Absolute: 0.4 10*3/uL (ref 0.1–1.0)
Monocytes Relative: 11 %
Neutro Abs: 1.2 10*3/uL — ABNORMAL LOW (ref 1.7–7.7)
Neutrophils Relative %: 34 %
Platelets: 57 10*3/uL — ABNORMAL LOW (ref 150–400)
RBC: 3.73 MIL/uL — ABNORMAL LOW (ref 4.22–5.81)
RDW: 12 % (ref 11.5–15.5)
WBC: 3.5 10*3/uL — ABNORMAL LOW (ref 4.0–10.5)
nRBC: 0 % (ref 0.0–0.2)

## 2019-05-17 LAB — COMPREHENSIVE METABOLIC PANEL
ALT: 16 U/L (ref 0–44)
AST: 24 U/L (ref 15–41)
Albumin: 2.9 g/dL — ABNORMAL LOW (ref 3.5–5.0)
Alkaline Phosphatase: 85 U/L (ref 38–126)
Anion gap: 6 (ref 5–15)
BUN: 32 mg/dL — ABNORMAL HIGH (ref 6–20)
CO2: 28 mmol/L (ref 22–32)
Calcium: 8.9 mg/dL (ref 8.9–10.3)
Chloride: 105 mmol/L (ref 98–111)
Creatinine, Ser: 2.08 mg/dL — ABNORMAL HIGH (ref 0.61–1.24)
GFR calc Af Amer: 41 mL/min — ABNORMAL LOW (ref >60)
GFR calc non Af Amer: 36 mL/min — ABNORMAL LOW (ref >60)
Glucose, Bld: 146 mg/dL — ABNORMAL HIGH (ref 70–99)
Potassium: 4.8 mmol/L (ref 3.5–5.1)
Sodium: 139 mmol/L (ref 135–145)
Total Bilirubin: 0.5 mg/dL (ref 0.3–1.2)
Total Protein: 5.5 g/dL — ABNORMAL LOW (ref 6.5–8.1)

## 2019-05-17 LAB — VALPROIC ACID LEVEL: Valproic Acid Lvl: 89 ug/mL (ref 50.0–100.0)

## 2019-05-17 LAB — GLUCOSE, CAPILLARY
Glucose-Capillary: 135 mg/dL — ABNORMAL HIGH (ref 70–99)
Glucose-Capillary: 160 mg/dL — ABNORMAL HIGH (ref 70–99)
Glucose-Capillary: 135 mg/dL — ABNORMAL HIGH (ref 70–99)
Glucose-Capillary: 188 mg/dL — ABNORMAL HIGH (ref 70–99)

## 2019-05-17 MED ORDER — METHOCARBAMOL 500 MG PO TABS
1000.0000 mg | ORAL_TABLET | Freq: Four times a day (QID) | ORAL | Status: DC
  Administered 2019-05-17 – 2019-05-20 (×11): 1000 mg via ORAL
  Filled 2019-05-17 (×11): qty 2

## 2019-05-17 NOTE — Telephone Encounter (Signed)
This CM spoke to [place] / [employer] who confirmed receipt of the [facility] referral. The assessment had not yet been scheduled .

## 2019-05-17 NOTE — Telephone Encounter (Signed)
Call placed to patient's sister, [name], and informed her that the [place] document that she dropped off at [facility] needs to be notarized.  It will be left at the front desk for her to pick up

## 2019-05-17 NOTE — Progress Notes (Signed)
   Subjective:   Pt seen at the bedside this morning. States he developed L wrist pain overnight. Pt is interested in [place] facility. Back pain stable to worsening, has not worked with PT recently. Discussed need for neurosurgery follow-up outpatient and the importance of continued PT/mobility while in the hospital.    Objective:  Vital signs in last 24 hours: Vitals:   05/16/19 1805 05/16/19 2117 05/16/19 2200 05/17/19 0552  BP: 116/77 118/70  140/82  Pulse: 77 67  66  Resp: 18 20  18   Temp: (!) 97.5 F (36.4 C) 98.6 F (37 C)  97.8 F (36.6 C)  TempSrc: Oral Oral  Oral  SpO2: 97% 97%  98%  Weight:   89.4 kg   Height:        Physical Exam Constitutional:      General: He is not in acute distress.    Appearance: Normal appearance.  Musculoskeletal:     Left wrist: No swelling, deformity or effusion. Normal range of motion.  Neurological:     Mental Status: He is alert.      Assessment/Plan:  Active Problems:   Acute midline low back pain without sciatica   AKI (acute kidney injury) ([place])   Recurrent falls  # Ground Level Fall # Acute on Chronic Back Pain Today, will continue with current pain management regimen. No new neurological complaints. Working towards finding SNF placement. Appreciate assistance from [facility] team.    - Tramadol 50 mg at bedtime  - Tizanidine 4 mg TID - Robaxin 1000 mg QID - continue PT/OT efforts while inpatient   # Type 2 Diabetes Mellitus - Levemir 28 untis  - SSI  # Pancytopenia  This is a chronic, stable issue. CMP reveals normal liver function. Depakote would be the most likely medication on his list to cause this, but levels were WNL. Will need outpatient follow up for this.   Dispo: Anticipated discharge pending SNF placement.    Dr. Jose Persia Internal Medicine PGY-1  Pager: [phone] 05/17/2019, 7:55 AM

## 2019-05-17 NOTE — TOC Progression Note (Addendum)
Transition of Care [facility]) - Progression Note    Patient Details  Name: RAYHAN GROLEAU MRN: [id number] Date of Birth: 30-May-1965  Transition of Care [facility]) CM/SW Contact  Sharlet Salina Mila Homer, LCSW Phone Number: 05/17/2019, 4:46 PM  Clinical Narrative: CSW submitted for PASRR number and patient level II PASRR and additional clinicals requested and faxed to [place] MUST. CSW received a request from [facility] MUST for a dementia primary note. MD contacted and note left on patient's hard chart. When checked later today, there was a note attached to the dementia primary note that read: "Psychiatric diagnosis superceded the dementia I believe per chart review. Signed by Dr. Charleen Kirks. This information was communicated to [place] MUST. CSW will continue to follow and await additional instructions from [place] MUST.Marland Kitchen      Expected Discharge Plan: Skilled Nursing Facility Barriers to Discharge: Insurance Authorization, SNF Pending bed offer, Awaiting State Approval (PASRR)(lSubmitted for PASRR and requested documentation has to be sent to [facility] MUST; sister needs bed offers once availabile.)  Expected Discharge Plan and Services Expected Discharge Plan: [place] In-house Referral: Clinical Social Work Discharge Planning Services: Other - See comment(SNF consult) Post Acute Care Choice: [place] Living arrangements for the past 2 months: Apartment                 DME Arranged: N/A DME Agency: NA       HH Arranged: NA HH Agency: NA         Social Determinants of Health (SDOH) Interventions    Readmission Risk Interventions No flowsheet data found.

## 2019-05-17 NOTE — Progress Notes (Signed)
Physical Therapy Treatment Patient Details Name: [name] MRN: [id number] DOB: 1965/12/09 Today's Date: 05/17/2019    History of Present Illness Patient is a 54 y/o male presenting to the ED after a fall at home. CT head negative for acute intracranial abnormalities. CT cervical positive for prominent disc-osteophyte bulge at C5-6 with possible mass effect. PMHx of chronic back pain 2/2 degenerative disc disease, early onset dementia, T2DM with neuropathy, CKD, HTN, GERD.     PT Comments    Pt tolerated increased ambulation distance of 130' with RW today, he had no loss of balance. Pt ambulates with decreased step length and decreased velocity.   Follow Up Recommendations  SNF;Supervision/Assistance - 24 hour;Supervision for mobility/OOB     Equipment Recommendations  Other (comment)(defer)    Recommendations for Other Services       Precautions / Restrictions Precautions Precautions: Fall Precaution Comments: pt reports h/o falls Restrictions Weight Bearing Restrictions: No    Mobility  Bed Mobility Overal bed mobility: Needs Assistance Bed Mobility: Supine to Sit     Supine to sit: Modified independent (Device/Increase time);HOB elevated     General bed mobility comments: used bedrail to pull up, increased time/effort  Transfers Overall transfer level: Needs assistance Equipment used: Rolling walker (2 wheeled) Transfers: Sit to/from Stand Sit to Stand: Min guard         General transfer comment: min/guard for safety, h/o falls  Ambulation/Gait Ambulation/Gait assistance: Min guard Gait Distance (Feet): 130 Feet Assistive device: Rolling walker (2 wheeled) Gait Pattern/deviations: Step-to pattern;Step-through pattern;Decreased stride length;Narrow base of support;Decreased step length - right;Decreased step length - left Gait velocity: decreased   General Gait Details: VCs to increase step length   Stairs             Wheelchair Mobility     Modified Rankin (Stroke Patients Only)       Balance Overall balance assessment: Needs assistance Sitting-balance support: Feet supported Sitting balance-Leahy Scale: Good     Standing balance support: Bilateral upper extremity supported;During functional activity Standing balance-Leahy Scale: Poor                              Cognition Arousal/Alertness: Awake/alert Behavior During Therapy: WFL for tasks assessed/performed Overall Cognitive Status: Within Functional Limits for tasks assessed                                        Exercises      General Comments        Pertinent Vitals/Pain Pain Assessment: Faces Faces Pain Scale: Hurts even more Pain Location: back (pt did not rate pain when asked) Pain Descriptors / Indicators: Aching Pain Intervention(s): Limited activity within patient's tolerance;Monitored during session;Premedicated before session    Home Living                      Prior Function            PT Goals (current goals can now be found in the care plan section) Acute Rehab PT Goals Patient Stated Goal: improve walking, decrease pain PT Goal Formulation: With patient Time For Goal Achievement: 05/29/19 Potential to Achieve Goals: Good Progress towards PT goals: Progressing toward goals    Frequency    Min 3X/week      PT Plan Current plan remains appropriate  Co-evaluation              AM-PAC PT "6 Clicks" Mobility   Outcome Measure  Help needed turning from your back to your side while in a flat bed without using bedrails?: None Help needed moving from lying on your back to sitting on the side of a flat bed without using bedrails?: A Little Help needed moving to and from a bed to a chair (including a wheelchair)?: A Little Help needed standing up from a chair using your arms (e.g., wheelchair or bedside chair)?: A Little Help needed to walk in hospital room?: A Little Help needed  climbing 3-5 steps with a railing? : A Lot 6 Click Score: 18    End of Session Equipment Utilized During Treatment: Gait belt Activity Tolerance: Patient tolerated treatment well;Patient limited by fatigue Patient left: with call bell/phone within reach;in chair;with chair alarm set Nurse Communication: Mobility status PT Visit Diagnosis: Unsteadiness on feet (R26.81);Other abnormalities of gait and mobility (R26.89);Repeated falls (R29.6);Muscle weakness (generalized) (M62.81);History of falling (Z91.81)     Time: 0223-3612 PT Time Calculation (min) (ACUTE ONLY): 22 min  Charges:  $Gait Training: 8-22 mins                     Blondell Reveal Kistler PT 05/17/2019  Acute Rehabilitation Services Pager [phone] Office [phone]

## 2019-05-18 ENCOUNTER — Ambulatory Visit: Payer: Medicare HMO | Admitting: Orthopaedic Surgery

## 2019-05-18 ENCOUNTER — Telehealth: Payer: Self-pay | Admitting: Licensed Clinical Social Worker

## 2019-05-18 LAB — GLUCOSE, CAPILLARY
Glucose-Capillary: 96 mg/dL (ref 70–99)
Glucose-Capillary: 169 mg/dL — ABNORMAL HIGH (ref 70–99)
Glucose-Capillary: 180 mg/dL — ABNORMAL HIGH (ref 70–99)
Glucose-Capillary: 173 mg/dL — ABNORMAL HIGH (ref 70–99)

## 2019-05-18 LAB — BASIC METABOLIC PANEL
Anion gap: 7 (ref 5–15)
BUN: 29 mg/dL — ABNORMAL HIGH (ref 6–20)
CO2: 27 mmol/L (ref 22–32)
Calcium: 9.3 mg/dL (ref 8.9–10.3)
Chloride: 105 mmol/L (ref 98–111)
Creatinine, Ser: 1.91 mg/dL — ABNORMAL HIGH (ref 0.61–1.24)
GFR calc Af Amer: 46 mL/min — ABNORMAL LOW (ref >60)
GFR calc non Af Amer: 40 mL/min — ABNORMAL LOW (ref >60)
Glucose, Bld: 109 mg/dL — ABNORMAL HIGH (ref 70–99)
Potassium: 4.2 mmol/L (ref 3.5–5.1)
Sodium: 139 mmol/L (ref 135–145)

## 2019-05-18 MED ORDER — ENOXAPARIN SODIUM 40 MG/0.4ML ~~LOC~~ SOLN
40.0000 mg | SUBCUTANEOUS | Status: DC
  Administered 2019-05-18 – 2019-05-19 (×2): 40 mg via SUBCUTANEOUS
  Filled 2019-05-18 (×2): qty 0.4

## 2019-05-18 NOTE — Progress Notes (Signed)
Orthopedic Tech Progress Note Patient Details:  [name] 04/21/1966 [id number]  Ortho Devices Type of Ortho Device: Velcro wrist forearm splint Ortho Device/Splint Location: LUE Ortho Device/Splint Interventions: Application, Ordered   Post Interventions Patient Tolerated: Well Instructions Provided: Care of device, Adjustment of device   Janit Pagan 05/18/2019, 2:31 PM

## 2019-05-18 NOTE — Progress Notes (Signed)
Bed alarm turned on last night during shift report,pt refused bed alarm did not want it on educate pt the importance of having bed alarm on pt stated" I will call you when I need to get up I dont want to hear the alarm go off every time I move" call bell in reach pt verbalized understanding will continue to monitor

## 2019-05-18 NOTE — Progress Notes (Signed)
   Subjective:   Mr. Masi states he continues to have left wrist pain but is able to move it without difficulty. He is also not a fan of the food but did eat his breakfast. No other acute complaints at this time.   Objective:  Vital signs in last 24 hours: Vitals:   05/17/19 0936 05/17/19 1644 05/17/19 2045 05/18/19 0615  BP: 107/69 112/78 106/73 117/81  Pulse: 70 72 63 (!) 59  Resp: 18 18 18 18   Temp: 98.5 F (36.9 C) 98.2 F (36.8 C) 98.9 F (37.2 C) 97.6 F (36.4 C)  TempSrc: Oral Oral Oral Oral  SpO2: 97% 99% 98% 98%  Weight:      Height:        Physical Exam Vitals and nursing note reviewed.  Constitutional:      General: He is not in acute distress.    Appearance: He is normal weight.  Pulmonary:     Effort: Pulmonary effort is normal. No respiratory distress.  Skin:    General: Skin is warm and dry.  Neurological:     General: No focal deficit present.     Mental Status: He is alert and oriented to person, place, and time. Mental status is at baseline.    Assessment/Plan:  Active Problems:   Acute midline low back pain without sciatica   AKI (acute kidney injury) ([place])   Recurrent falls  # Ground Level Fall # Acute on Chronic Back Pain Continues to be stable today and working on SNF placement.   - Tramadol 50 mg at bedtime  - Tizanidine 4 mg TID - Robaxin 1000 mg QID - continue PT/OT efforts while inpatient   # Type 2 Diabetes Mellitus - Levemir28untis  - SSI   Dispo: Anticipated dischargepending SNF placement.  Dr. Jose Persia Internal Medicine PGY-1  Pager: [phone] 05/18/2019, 6:58 AM

## 2019-05-18 NOTE — Telephone Encounter (Signed)
This MSW intern placed call to patient's sister, [name] per voicemail requesting return call. Talked with sister about pt's next steps in care. [name] stated pt's hospital discharge plan is to be placed in a skilled nursing facility followed by a transition to assisted living. This MSW provided [name] with [employer] contact information [phone]) to assist her navigating the long term care system.

## 2019-05-18 NOTE — TOC Progression Note (Signed)
Transition of Care [facility]) - Progression Note    Patient Details  Name: CORDARIUS BENNING MRN: [id number] Date of Birth: 05/24/1965  Transition of Care [facility]) CM/SW Contact  Sharlet Salina Mila Homer, LCSW Phone Number: 05/18/2019, 3:22 PM  Clinical Narrative:   Roselyn Meier [phone]) contacted (3:14 pm) and provided with facility responses: [facility] and [facility]. Prior to calling sister calls made to both facilities and they still have bed availability for patient. Ms. Harriet Pho informed CSW that she was driving and asked for time to check out the facilities and she will contact CSW. CSW sent a text to Ms. Harriet Pho with the facility information: names, addresses and phone numbers.      Expected Discharge Plan: Skilled Nursing Facility Barriers to Discharge: Insurance Authorization, SNF Pending bed offer, Awaiting State Approval (PASRR)(lSubmitted for PASRR and requested documentation has to be sent to [facility] MUST; sister needs bed offers once availabile.)  Expected Discharge Plan and Services Expected Discharge Plan: [place] In-house Referral: Clinical Social Work Discharge Planning Services: Other - See comment(SNF consult) Post Acute Care Choice: [place] Living arrangements for the past 2 months: Apartment                 DME Arranged: N/A DME Agency: NA       HH Arranged: NA HH Agency: NA         Social Determinants of Health (SDOH) Interventions  No SDOH interventions needed at this time.  Readmission Risk Interventions No flowsheet data found.

## 2019-05-18 NOTE — Progress Notes (Signed)
Physical Therapy Treatment Patient Details Name: [name] MRN: [id number] DOB: 09-16-1965 Today's Date: 05/18/2019    History of Present Illness Patient is a 54 y/o male presenting to the ED after a fall at home. CT head negative for acute intracranial abnormalities. CT cervical positive for prominent disc-osteophyte bulge at C5-6 with possible mass effect. PMHx of chronic back pain 2/2 degenerative disc disease, early onset dementia, T2DM with neuropathy, CKD, HTN, GERD.     PT Comments    Pt was seen for application of L wrist splint as PT arrived, then to walk on hallway and work on STS transitions with cues for safety and posture.  He is not aware of his fatigue and giving out of LE's, which may be contributing to falls at home.  Pt was assisted to the chair in his room, and worked on resting and assisting standing with cues for hand placement being delivered 100% of the trials.  Follow acutely for these needs, and will focus on safety and strengthening of LE's with balance correction for gait and standing with appropriate AD.  May be better served with platform attachment on walker.   Follow Up Recommendations  SNF;Supervision/Assistance - 24 hour;Supervision for mobility/OOB     Equipment Recommendations  None recommended by PT(defer to next venue)    Recommendations for Other Services       Precautions / Restrictions Precautions Precautions: Fall Precaution Comments: pt reports h/o falls Restrictions Weight Bearing Restrictions: No Other Position/Activity Restrictions: has a new splint on L wrist to make neutral support    Mobility  Bed Mobility Overal bed mobility: Needs Assistance             General bed mobility comments: pt was on side of bed and standing when PT arrived  Transfers Overall transfer level: Needs assistance Equipment used: Rolling walker (2 wheeled);1 person hand held assist Transfers: Sit to/from Stand Sit to Stand: Mod assist Stand pivot  transfers: Min assist       General transfer comment: mod to power up possibly due to new splint  Ambulation/Gait Ambulation/Gait assistance: Min guard;Min assist Gait Distance (Feet): 140 Feet Assistive device: Rolling walker (2 wheeled);1 person hand held assist Gait Pattern/deviations: Step-to pattern;Shuffle;Decreased stride length;Trunk flexed Gait velocity: decreased Gait velocity interpretation: <1.31 ft/sec, indicative of household ambulator General Gait Details: pt was seen for mobility on RW with poor control of knees once tired, began to buckle at times and pt did not react   Chief Strategy Officer    Modified Rankin (Stroke Patients Only)       Balance Overall balance assessment: Needs assistance Sitting-balance support: Feet supported Sitting balance-Leahy Scale: Good     Standing balance support: Bilateral upper extremity supported;During functional activity Standing balance-Leahy Scale: Poor Standing balance comment: pt was standing alone to use urinal when PT arrived, bed alarming and he did not react                            Cognition Arousal/Alertness: Awake/alert Behavior During Therapy: Impulsive Overall Cognitive Status: No family/caregiver present to determine baseline cognitive functioning                                 General Comments: pt was up with bed alarming when PT arrived      Exercises  General Comments        Pertinent Vitals/Pain Pain Assessment: Faces Faces Pain Scale: Hurts little more Pain Location: midback Pain Descriptors / Indicators: Aching Pain Intervention(s): Limited activity within patient's tolerance;Monitored during session;Repositioned    Home Living                      Prior Function            PT Goals (current goals can now be found in the care plan section) Acute Rehab PT Goals Patient Stated Goal: reduced back pain Potential to  Achieve Goals: Good Progress towards PT goals: Progressing toward goals    Frequency    Min 3X/week      PT Plan Current plan remains appropriate    Co-evaluation              AM-PAC PT "6 Clicks" Mobility   Outcome Measure  Help needed turning from your back to your side while in a flat bed without using bedrails?: None Help needed moving from lying on your back to sitting on the side of a flat bed without using bedrails?: A Little Help needed moving to and from a bed to a chair (including a wheelchair)?: A Little Help needed standing up from a chair using your arms (e.g., wheelchair or bedside chair)?: A Lot Help needed to walk in hospital room?: A Little Help needed climbing 3-5 steps with a railing? : Total 6 Click Score: 16    End of Session Equipment Utilized During Treatment: Gait belt Activity Tolerance: Treatment limited secondary to medical complications (Comment) Patient left: in chair;with call bell/phone within reach;with chair alarm set Nurse Communication: Mobility status PT Visit Diagnosis: Unsteadiness on feet (R26.81);Other abnormalities of gait and mobility (R26.89);Repeated falls (R29.6);Muscle weakness (generalized) (M62.81);History of falling (Z91.81)     Time: 8638-1771 PT Time Calculation (min) (ACUTE ONLY): 33 min  Charges:  $Gait Training: 8-22 mins $Therapeutic Activity: 8-22 mins                    Ramond Dial 05/18/2019, 3:08 PM   Mee Hives, PT MS Acute Rehab Dept. Number: [place] [facility] [place]

## 2019-05-19 DIAGNOSIS — N39498 Other specified urinary incontinence: Secondary | ICD-10-CM

## 2019-05-19 DIAGNOSIS — Z885 Allergy status to narcotic agent status: Secondary | ICD-10-CM

## 2019-05-19 DIAGNOSIS — N401 Enlarged prostate with lower urinary tract symptoms: Secondary | ICD-10-CM

## 2019-05-19 LAB — GLUCOSE, CAPILLARY
Glucose-Capillary: 99 mg/dL (ref 70–99)
Glucose-Capillary: 176 mg/dL — ABNORMAL HIGH (ref 70–99)
Glucose-Capillary: 212 mg/dL — ABNORMAL HIGH (ref 70–99)
Glucose-Capillary: 200 mg/dL — ABNORMAL HIGH (ref 70–99)

## 2019-05-19 LAB — RESPIRATORY PANEL BY RT PCR (FLU A&B, COVID)
Influenza A by PCR: NEGATIVE
Influenza B by PCR: NEGATIVE
SARS Coronavirus 2 by RT PCR: NEGATIVE

## 2019-05-19 MED ORDER — TADALAFIL 5 MG PO TABS
5.0000 mg | ORAL_TABLET | Freq: Every day | ORAL | Status: DC
  Administered 2019-05-20: 5 mg via ORAL
  Filled 2019-05-19 (×3): qty 1

## 2019-05-19 MED ORDER — GABAPENTIN 300 MG PO CAPS: 300.0000 mg | ORAL_CAPSULE | Freq: Two times a day (BID) | ORAL | 0 refills | Status: DC

## 2019-05-19 MED ORDER — TAMSULOSIN HCL 0.4 MG PO CAPS
0.4000 mg | ORAL_CAPSULE | Freq: Every day | ORAL | Status: DC
  Administered 2019-05-19: 0.4 mg via ORAL
  Filled 2019-05-19: qty 1

## 2019-05-19 NOTE — Plan of Care (Signed)
  Problem: Education: Goal: Knowledge of disease and its progression will improve Outcome: Progressing   Problem: Activity: Goal: Activity intolerance will improve Outcome: Progressing

## 2019-05-19 NOTE — Discharge Summary (Addendum)
Name: [name] MRN: [id number] DOB: 22-Mar-1966 54 y.o. PCP: Jonathan Rakes, MD  Date of Admission: 05/14/2019  8:00 AM Date of Discharge: 05/19/2019 Attending Physician: Axel Filler, *  Discharge Diagnosis: 1. Acute on Chronic Back Pain 2/2 Ground level Fall 2. Acute Kidney Injury 3. Pancytopenia  4. History of Dementia  5. BPH  Discharge Medications: Allergies as of 05/19/2019      Reactions   Vicodin [hydrocodone-acetaminophen] Itching      Medication List    STOP taking these medications   Chantix 1 MG tablet Generic drug: varenicline     TAKE these medications   acetaminophen 325 MG tablet Commonly known as: TYLENOL Take 2 tablets (650 mg total) by mouth every 6 (six) hours as needed for mild pain (or Fever >/= 101).   ARIPiprazole 5 MG tablet Commonly known as: ABILIFY TAKE 1 TABLET BY MOUTH DAILY FOR DEPRESSION What changed: See the new instructions.   atorvastatin 10 MG tablet Commonly known as: LIPITOR TAKE 1 TABLET(10 MG) BY MOUTH DAILY What changed: See the new instructions.   B-D UF III MINI PEN NEEDLES 31G X 5 MM Misc Generic drug: Insulin Pen Needle USE FOUR TIMES DAILY   buPROPion 300 MG 24 hr tablet Commonly known as: WELLBUTRIN XL Take 1 tablet (300 mg total) by mouth daily.   busPIRone 15 MG tablet Commonly known as: BUSPAR Take 15 mg by mouth 2 (two) times daily.   diclofenac sodium 1 % Gel Commonly known as: Voltaren Apply 4 g topically 4 (four) times daily.   divalproex 500 MG DR tablet Commonly known as: DEPAKOTE Take 3 tablets (1,500 mg total) by mouth at bedtime. What changed: Another medication with the same name was removed. Continue taking this medication, and follow the directions you see here.   feeding supplement (ENSURE ENLIVE) Liqd Take 237 mLs by mouth 2 (two) times daily between meals.   fluticasone 50 MCG/ACT nasal spray Commonly known as: FLONASE SHAKE LIQUID AND USE 2 SPRAYS IN EACH NOSTRIL  DAILY What changed: See the new instructions.   gabapentin 300 MG capsule Commonly known as: NEURONTIN Take 1 capsule (300 mg total) by mouth 2 (two) times daily.   glucose blood test strip Commonly known as: Accu-Chek Guide CHECK SUGAR FOUR TIMES DAILY   insulin aspart 100 UNIT/ML FlexPen Commonly known as: NovoLOG FlexPen 0 to 12 units subcutaneously 3 times daily as per sliding scale What changed:   how much to take  how to take this  when to take this  additional instructions   Levemir FlexTouch 100 UNIT/ML Pen Generic drug: Insulin Detemir Inject twice daily subcutaneously 38units in the morning and 38 units in the evening What changed:   how much to take  how to take this  when to take this  additional instructions   lidocaine 5 % Commonly known as: Lidoderm Place 1 patch onto the skin daily. Remove & Discard patch within 12 hours or as directed by MD   linagliptin 5 MG Tabs tablet Commonly known as: Tradjenta Take 1 tablet (5 mg total) by mouth daily.   lisinopril-hydrochlorothiazide 10-12.5 MG tablet Commonly known as: ZESTORETIC Take 1 tablet by mouth daily.   methocarbamol 500 MG tablet Commonly known as: Robaxin Take 2 tablets (1,000 mg total) by mouth 4 (four) times daily. What changed: when to take this   omeprazole 40 MG capsule Commonly known as: PRILOSEC TAKE 1 CAPSULE(40 MG) BY MOUTH DAILY What changed: See the new instructions.  QUEtiapine 400 MG tablet Commonly known as: SEROQUEL Take 1 tablet (400 mg total) by mouth at bedtime.   tadalafil 5 MG tablet Commonly known as: CIALIS TAKE ONE TABLET BY MOUTH EVERY DAY FOR BPH   tamsulosin 0.4 MG Caps capsule Commonly known as: FLOMAX Take 0.4 mg by mouth at bedtime.   Testosterone 20.25 MG/1.25GM (1.62%) Gel Commonly known as: AndroGel Apply 20.25 mg topically every morning. Apply topically under arm pit alternating with each application.   tiZANidine 4 MG capsule Commonly  known as: Zanaflex Take 1 capsule (4 mg total) by mouth 3 (three) times daily. What changed:   when to take this  reasons to take this   traMADol 50 MG tablet Commonly known as: ULTRAM Take 1 tablet (50 mg total) by mouth at bedtime. What changed:   when to take this  reasons to take this   traZODone 150 MG tablet Commonly known as: DESYREL Take 1 tablet (150 mg total) by mouth at bedtime. What changed: Another medication with the same name was removed. Continue taking this medication, and follow the directions you see here.   vitamin B-12 500 MCG tablet Commonly known as: CYANOCOBALAMIN Take 1 tablet (500 mcg total) by mouth daily.       Disposition and follow-up:   JonathanStepehn T Gray was discharged from [facility] in Good condition.  At the hospital follow up visit please address:  1.   Will need follow up with Neurology for dementia work up  Will need follow up with Neurosurgery for spinal abnormalities seen on imaging  Will need follow up with Hematology regarding pancytopenia   2.  Labs / imaging needed at time of follow-up: None   3.  Pending labs/ test needing follow-up: None  Follow-up Appointments: Follow-up Information    Jonathan Rakes, MD. Go on 05/24/2019.   Specialty: Family Medicine Why: Arrive by 2:55 PM Contact information: [place] [address] [address] Hospital Course by problem list: 1. Acute on Chronic Back Pain 2/2 Ground level Fall Patient has a complicated spinal history with increasing frequency of falls recently, last ED visit for fall being 3 weeks ago. CT cervical on admission was concerning for prominent disc-osteophytic bulge at C5-6 with superimposed ossification of the posterior longitudinal ligament, causing moderate central canal stenosis with possible mass effect on the anterior portion of the cervical cord. CT cervical from 04/23/2019 showed similar findings, but did not note  on mass effect or ligament involvement. MRI was attempted to be obtained but patient unable to tolerate. Due to lack of acute changes in neurological functioning, repeated attempt at MRI was deferred. Pain was well controlled with home medications. PT and OT evaluated and recommend SNF placement for patient.   2. Acute Kidney Injury Likely secondary to hypovolemia with decreased PO intake. Resolved with IVF.   3. Pancytopenia  History of thrombocytopenia in the past dating back to 2017. Platelets have been in the 50s while admitted, which is the lowest he's been per chart review. Anemia appears to fluctuate but intermittent anemia dating back at least to 2014. Leukopenia appears new.   4. History of Dementia  Previously diagnosed with dementia, per patient. However, per chart review, in 2018 Neurology was uncertain regarding diagnosis and recommended further work up. At the time, he declined further work up. Recommend he follow up with Neurology again.   5. BPH Per chart review, patient's Cialis was  marked as "not taking" at last appointment with PCP, however he reports he is still taking it. Given his episode of incontinence, will restart it at former dose. Tamsulosin was restarted on discharge.   Discharge Vitals:   BP 119/76 (BP Location: Left Arm)   Pulse 68   Temp 97.7 F (36.5 C) (Oral)   Resp 18   Ht 6\' 5"  (1.956 m)   Wt 89.4 kg   SpO2 97%   BMI 23.37 kg/m   Pertinent Labs, Studies, and Procedures:   CBC Latest Ref Rng & Units 05/17/2019 05/16/2019 05/15/2019  WBC 4.0 - 10.5 K/uL 3.5(L) 3.8(L) 3.9(L)  Hemoglobin 13.0 - 17.0 g/dL 11.4(L) 11.8(L) 12.1(L)  Hematocrit 39.0 - 52.0 % 33.1(L) 34.8(L) 35.6(L)  Platelets 150 - 400 K/uL 57(L) 58(L) 55(L)   BMP Latest Ref Rng & Units 05/18/2019 05/17/2019 05/16/2019  Glucose 70 - 99 mg/dL 109(H) 146(H) 118(H)  BUN 6 - 20 mg/dL 29(H) 32(H) 32(H)  Creatinine 0.61 - 1.24 mg/dL 1.91(H) 2.08(H) 2.30(H)  BUN/Creat Ratio 9 - 20 - - -  Sodium  135 - 145 mmol/L 139 139 138  Potassium 3.5 - 5.1 mmol/L 4.2 4.8 4.5  Chloride 98 - 111 mmol/L 105 105 100  CO2 22 - 32 mmol/L 27 28 28   Calcium 8.9 - 10.3 mg/dL 9.3 8.9 9.1   05/14/2019 CT Head WO Contrast:  CT Cervical Spine WO Contrast:  IMPRESSION: 1. No acute intracranial abnormality. No intracranial mass, hemorrhage or edema. No skull fracture. 2. No fracture or acute subluxation within the cervical spine. 3. Degenerative change within the cervical spine, most prominent at C5-6 where there is a prominent disc-osteophytic bulge and superimposed ossification of the posterior longitudinal ligament, causing moderate central canal stenosis with possible mass effect on the anterior portion of the cervical cord.  CXR:  IMPRESSION: No acute findings. No evidence of pneumonia or pulmonary edema.  Wrist Xray IMPRESSION: Negative.  Discharge Instructions: Discharge Instructions    Call MD for:  difficulty breathing, headache or visual disturbances   Complete by: As directed    Call MD for:  extreme fatigue   Complete by: As directed    Call MD for:  persistant dizziness or light-headedness   Complete by: As directed    Call MD for:  persistant nausea and vomiting   Complete by: As directed    Call MD for:  redness, tenderness, or signs of infection (pain, swelling, redness, odor or green/yellow discharge around incision site)   Complete by: As directed    Call MD for:  severe uncontrolled pain   Complete by: As directed    Call MD for:  temperature >100.4   Complete by: As directed    Diet - low sodium heart healthy   Complete by: As directed    Discharge instructions   Complete by: As directed    Mr. Eckenrode,   You were admitted to a hospital after a fall at home. To build strength and stability and avoid future falls, physical therapy recommended a rehabilitation center as the next step.   We recommend you follow up with your primary care doctor and neurologist.   It  was a pleasure to meet you!   Increase activity slowly   Complete by: As directed       Signed: Dr. Jose Persia Internal Medicine PGY-1  Pager: [phone] 05/19/2019, 3:07 PM

## 2019-05-19 NOTE — TOC Progression Note (Signed)
Transition of Care [facility]) - Progression Note    Patient Details  Name: [name] MRN: [id number] Date of Birth: 1966/03/21  Transition of [facility] CM/SW Contact  Sharlet Salina Mila Homer, LCSW Phone Number: 05/19/2019, 5:27 PM  Clinical Narrative:  COVID resulted 4:44 pm. Kathryne Hitch, admissions director with [facility] and she was unable to talk with CSW at that time. Texted Juliann Pulse regarding negative COVID results and that patient can discharge tomorrow. CSW will f/.u with Juliann Pulse Friday morning regarding patient's discharge.    Expected Discharge Plan: Skilled Nursing Facility Barriers to Discharge: Insurance Authorization, SNF Pending bed offer, Awaiting State Approval (PASRR)(lSubmitted for PASRR and requested documentation has to be sent to [facility] MUST; sister needs bed offers once availabile.)  Expected Discharge Plan and Services Expected Discharge Plan: [place] In-house Referral: Clinical Social Work Discharge Planning Services: Other - See comment(SNF consult) Post Acute Care Choice: [place] arrangements for the past 2 months: Apartment Expected Discharge Date: 05/19/19               DME Arranged: N/A DME Agency: NA       HH Arranged: NA HH Agency: NA         Social Determinants of Health (SDOH) Interventions    Readmission Risk Interventions No flowsheet data found.

## 2019-05-19 NOTE — Progress Notes (Signed)
Occupational Therapy Treatment Patient Details Name: [name] MRN: [id number] DOB: Nov 06, 1965 Today's Date: 05/19/2019    History of present illness Patient is a 54 y/o male presenting to the ED after a fall at home. CT head negative for acute intracranial abnormalities. CT cervical positive for prominent disc-osteophyte bulge at C5-6 with possible mass effect. PMHx of chronic back pain 2/2 degenerative disc disease, early onset dementia, T2DM with neuropathy, CKD, HTN, GERD.    OT comments  Pt in bed upon arrival. Session focused on LB ADLs, grooming at sink, toilet transfers and toileting safety. Pt used RW for functional mobility min guard A. Pt pleasant and cooperative, impulsive. OT will continue to follow acutely  Follow Up Recommendations  SNF    Equipment Recommendations   TBD at next venue of care   Recommendations for Other Services      Precautions / Restrictions Precautions Precautions: Fall Precaution Comments: pt reports h/o falls Restrictions Weight Bearing Restrictions: No Other Position/Activity Restrictions: has a new splint on L wrist to make neutral support       Mobility Bed Mobility Overal bed mobility: Needs Assistance Bed Mobility: Supine to Sit;Sit to Supine     Supine to sit: Modified independent (Device/Increase time);HOB elevated Sit to supine: Modified independent (Device/Increase time)      Transfers Overall transfer level: Needs assistance Equipment used: Rolling walker (2 wheeled);1 person hand held assist Transfers: Sit to/from Stand Sit to Stand: Mod assist Stand pivot transfers: Min assist       General transfer comment: mod to power up possibly due to new splint    Balance Overall balance assessment: Needs assistance Sitting-balance support: Feet supported Sitting balance-Leahy Scale: Good     Standing balance support: Bilateral upper extremity supported;During functional activity Standing balance-Leahy Scale: Poor                             ADL either performed or assessed with clinical judgement   ADL Overall ADL's : Needs assistance/impaired     Grooming: Wash/dry hands;Wash/dry face;Standing;Supervision/safety       Lower Body Bathing: Minimal assistance;Sit to/from stand   Upper Body Dressing : Min guard;Sitting   Lower Body Dressing: Moderate assistance;Sit to/from stand   Toilet Transfer: Min guard;Ambulation;RW;Cueing for safety   Toileting- Clothing Manipulation and Hygiene: Min guard       Functional mobility during ADLs: Min guard;Cueing for safety;Rolling walker       Vision Baseline Vision/History: Wears glasses;Glaucoma Wears Glasses: At all times     Perception     Praxis      Cognition Arousal/Alertness: Awake/alert Behavior During Therapy: Impulsive Overall Cognitive Status: No family/caregiver present to determine baseline cognitive functioning                                          Exercises     Shoulder Instructions       General Comments      Pertinent Vitals/ Pain       Pain Assessment: Faces Faces Pain Scale: Hurts little more Pain Location: midback, wrist Pain Descriptors / Indicators: Aching;Sore Pain Intervention(s): Monitored during session;Repositioned  Home Living  Prior Functioning/Environment              Frequency  Min 2X/week        Progress Toward Goals  OT Goals(current goals can now be found in the care plan section)  Progress towards OT goals: Progressing toward goals  ADL Goals Pt Will Perform Lower Body Bathing: with min guard assist;with supervision Pt Will Perform Lower Body Dressing: with min assist Pt Will Transfer to Toilet: with min guard assist;with supervision;ambulating Pt Will Perform Toileting - Clothing Manipulation and hygiene: with supervision  Plan Discharge plan remains appropriate    Co-evaluation                  AM-PAC OT "6 Clicks" Daily Activity     Outcome Measure   Help from another person eating meals?: None Help from another person taking care of personal grooming?: A Little Help from another person toileting, which includes using toliet, bedpan, or urinal?: A Little Help from another person bathing (including washing, rinsing, drying)?: A Little Help from another person to put on and taking off regular upper body clothing?: A Little Help from another person to put on and taking off regular lower body clothing?: A Lot 6 Click Score: 18    End of Session Equipment Utilized During Treatment: Rolling walker;Gait belt  OT Visit Diagnosis: Unsteadiness on feet (R26.81);Repeated falls (R29.6)   Activity Tolerance Patient tolerated treatment well   Patient Left in bed;with call bell/phone within reach;with bed alarm set   Nurse Communication          Time: 5638-7564 OT Time Calculation (min): 25 min  Charges: OT General Charges $OT Visit: 1 Visit OT Treatments $Self Care/Home Management : 8-22 mins $Therapeutic Activity: 8-22 mins    Britt Bottom 05/19/2019, 1:48 PM

## 2019-05-19 NOTE — Progress Notes (Signed)
   Subjective:   [name] is doing well this AM. He is requesting we restart his home Cialis as he takes it for BPH. Due to not having it, he had an episode of urinary incontinence overnight. He notes that his left wrist is feeling better since the brace was placed.   Objective:  Vital signs in last 24 hours: Vitals:   05/18/19 0912 05/18/19 1635 05/18/19 2117 05/19/19 0538  BP: 124/75 109/71 (!) 145/90 138/78  Pulse: 70 79 63 68  Resp: 18 18 18 18   Temp: 98 F (36.7 C) 97.7 F (36.5 C) 98.2 F (36.8 C) 98.4 F (36.9 C)  TempSrc: Oral Oral Oral Oral  SpO2: 98% 99% 97% 98%  Weight:      Height:        Physical Exam Vitals and nursing note reviewed.  Constitutional:      General: He is not in acute distress.    Appearance: He is normal weight.  Pulmonary:     Effort: Pulmonary effort is normal. No respiratory distress.  Musculoskeletal:     Comments: Left wrist brace in place  Skin:    General: Skin is warm and dry.  Neurological:     General: No focal deficit present.     Mental Status: He is alert and oriented to person, place, and time. Mental status is at baseline.  Psychiatric:        Mood and Affect: Mood normal.        Behavior: Behavior normal.    Assessment/Plan:  Active Problems:   Acute midline low back pain without sciatica   AKI (acute kidney injury) ([place])   Recurrent falls  # Ground Level Fall # Acute on Chronic Back Pain Stable at this time.   - Tramadol 50 mg at bedtime  - Tizanidine 4 mg TID - Robaxin 1000 mg QID - continue PT/OT efforts while inpatient  # Type 2 Diabetes Mellitus - Levemir28untis  - SSI - Resume home medication on discharge  # BPH  Per chart review, patient's Cialis was marked as "not taking" at last appointment. Given his episode of incontinence though, will restart it at former dose.   - Cialis 5 mg QD   Dispo: Anticipated dischargetoday as SNF bed assigned and only awaiting COVID test results.   Dr.  Jose Persia Internal Medicine PGY-1  Pager: [phone] 05/19/2019, 7:53 AM

## 2019-05-20 DIAGNOSIS — J302 Other seasonal allergic rhinitis: Secondary | ICD-10-CM | POA: Diagnosis not present

## 2019-05-20 DIAGNOSIS — R42 Dizziness and giddiness: Secondary | ICD-10-CM | POA: Diagnosis not present

## 2019-05-20 DIAGNOSIS — F250 Schizoaffective disorder, bipolar type: Secondary | ICD-10-CM | POA: Diagnosis not present

## 2019-05-20 DIAGNOSIS — M2550 Pain in unspecified joint: Secondary | ICD-10-CM | POA: Diagnosis not present

## 2019-05-20 DIAGNOSIS — F339 Major depressive disorder, recurrent, unspecified: Secondary | ICD-10-CM | POA: Diagnosis not present

## 2019-05-20 DIAGNOSIS — M5136 Other intervertebral disc degeneration, lumbar region: Secondary | ICD-10-CM | POA: Diagnosis not present

## 2019-05-20 DIAGNOSIS — M170 Bilateral primary osteoarthritis of knee: Secondary | ICD-10-CM | POA: Diagnosis not present

## 2019-05-20 DIAGNOSIS — L02219 Cutaneous abscess of trunk, unspecified: Secondary | ICD-10-CM | POA: Diagnosis not present

## 2019-05-20 DIAGNOSIS — R5381 Other malaise: Secondary | ICD-10-CM | POA: Diagnosis not present

## 2019-05-20 DIAGNOSIS — M549 Dorsalgia, unspecified: Secondary | ICD-10-CM | POA: Diagnosis not present

## 2019-05-20 DIAGNOSIS — R531 Weakness: Secondary | ICD-10-CM | POA: Diagnosis not present

## 2019-05-20 DIAGNOSIS — L720 Epidermal cyst: Secondary | ICD-10-CM | POA: Diagnosis not present

## 2019-05-20 DIAGNOSIS — L98429 Non-pressure chronic ulcer of back with unspecified severity: Secondary | ICD-10-CM | POA: Diagnosis not present

## 2019-05-20 DIAGNOSIS — F411 Generalized anxiety disorder: Secondary | ICD-10-CM | POA: Diagnosis not present

## 2019-05-20 DIAGNOSIS — R450 Nervousness: Secondary | ICD-10-CM | POA: Diagnosis not present

## 2019-05-20 DIAGNOSIS — F05 Delirium due to known physiological condition: Secondary | ICD-10-CM | POA: Diagnosis not present

## 2019-05-20 DIAGNOSIS — M25561 Pain in right knee: Secondary | ICD-10-CM | POA: Diagnosis not present

## 2019-05-20 DIAGNOSIS — R262 Difficulty in walking, not elsewhere classified: Secondary | ICD-10-CM | POA: Diagnosis not present

## 2019-05-20 DIAGNOSIS — F332 Major depressive disorder, recurrent severe without psychotic features: Secondary | ICD-10-CM | POA: Diagnosis not present

## 2019-05-20 DIAGNOSIS — M25562 Pain in left knee: Secondary | ICD-10-CM | POA: Diagnosis not present

## 2019-05-20 DIAGNOSIS — G9349 Other encephalopathy: Secondary | ICD-10-CM | POA: Diagnosis not present

## 2019-05-20 DIAGNOSIS — W19XXXA Unspecified fall, initial encounter: Secondary | ICD-10-CM | POA: Diagnosis not present

## 2019-05-20 DIAGNOSIS — I9589 Other hypotension: Secondary | ICD-10-CM | POA: Diagnosis not present

## 2019-05-20 DIAGNOSIS — G8929 Other chronic pain: Secondary | ICD-10-CM | POA: Diagnosis not present

## 2019-05-20 DIAGNOSIS — I10 Essential (primary) hypertension: Secondary | ICD-10-CM | POA: Diagnosis not present

## 2019-05-20 DIAGNOSIS — F29 Unspecified psychosis not due to a substance or known physiological condition: Secondary | ICD-10-CM | POA: Diagnosis not present

## 2019-05-20 DIAGNOSIS — F419 Anxiety disorder, unspecified: Secondary | ICD-10-CM | POA: Diagnosis not present

## 2019-05-20 DIAGNOSIS — R52 Pain, unspecified: Secondary | ICD-10-CM | POA: Diagnosis not present

## 2019-05-20 DIAGNOSIS — F4310 Post-traumatic stress disorder, unspecified: Secondary | ICD-10-CM | POA: Diagnosis not present

## 2019-05-20 DIAGNOSIS — Z7401 Bed confinement status: Secondary | ICD-10-CM | POA: Diagnosis not present

## 2019-05-20 DIAGNOSIS — E119 Type 2 diabetes mellitus without complications: Secondary | ICD-10-CM | POA: Diagnosis not present

## 2019-05-20 DIAGNOSIS — M6281 Muscle weakness (generalized): Secondary | ICD-10-CM | POA: Diagnosis not present

## 2019-05-20 DIAGNOSIS — K219 Gastro-esophageal reflux disease without esophagitis: Secondary | ICD-10-CM | POA: Diagnosis not present

## 2019-05-20 DIAGNOSIS — N400 Enlarged prostate without lower urinary tract symptoms: Secondary | ICD-10-CM | POA: Diagnosis not present

## 2019-05-20 DIAGNOSIS — L02212 Cutaneous abscess of back [any part, except buttock]: Secondary | ICD-10-CM | POA: Diagnosis not present

## 2019-05-20 DIAGNOSIS — G4700 Insomnia, unspecified: Secondary | ICD-10-CM | POA: Diagnosis not present

## 2019-05-20 DIAGNOSIS — M545 Low back pain: Secondary | ICD-10-CM | POA: Diagnosis not present

## 2019-05-20 DIAGNOSIS — R031 Nonspecific low blood-pressure reading: Secondary | ICD-10-CM | POA: Diagnosis not present

## 2019-05-20 DIAGNOSIS — N3942 Incontinence without sensory awareness: Secondary | ICD-10-CM | POA: Diagnosis not present

## 2019-05-20 DIAGNOSIS — R251 Tremor, unspecified: Secondary | ICD-10-CM | POA: Diagnosis not present

## 2019-05-20 DIAGNOSIS — F319 Bipolar disorder, unspecified: Secondary | ICD-10-CM | POA: Diagnosis not present

## 2019-05-20 DIAGNOSIS — F0390 Unspecified dementia without behavioral disturbance: Secondary | ICD-10-CM | POA: Diagnosis not present

## 2019-05-20 LAB — GLUCOSE, CAPILLARY
Glucose-Capillary: 93 mg/dL (ref 70–99)
Glucose-Capillary: 145 mg/dL — ABNORMAL HIGH (ref 70–99)

## 2019-05-20 NOTE — TOC Transition Note (Signed)
Transition of Care [facility]) - CM/SW Discharge Note *05/20/19 - Discharged to [facility] via ambulance   Patient Details  Name: [name] MRN: [id number] Date of Birth: [birth year]  Transition of Care [facility]) CM/SW Contact:  Sable Feil, LCSW Phone Number: 05/20/2019, 5:57 PM   Clinical Narrative:  Patient discharged to [facility] today, transported by non-emergency ambulance. Discharge clinicals transmitted to facility and sister informed of today's discharge. Patient accepted prior to insurance auth due to [employer] currently in place.    Final next level of care: [place] Barriers to Discharge: Barriers Resolved(Insurance auth waiver in place and PASRR number received)   Patient Goals and CMS Choice Patient states their goals for this hospitalization and ongoing recovery are:: Patient and sister, [name] agreeable to [facility] rehab CMS Medicare.gov Compare Post Acute Care list provided to:: Patient Represenative (must comment)(Sister assisted with facility decision) Choice offered to / list presented to : Sibling  Discharge Placement PASRR number recieved: 05/18/19            Patient chooses bed at: [facility]. 1/27 - 07/17/19) Patient to be transferred to facility by: Non-emergency ambulance Name of family member notified: Sister [name] - [phone] Patient and family notified of of transfer: 05/20/19  Discharge Plan and Services In-house Referral: Clinical Social Work Discharge Planning Services: Other - See comment(SNF consult) Post Acute Care Choice: [place]          DME Arranged: N/A DME Agency: NA       HH Arranged: NA HH Agency: NA        Social Determinants of Health (SDOH) Interventions  No SDOH interventions needed prior to discharge.   Readmission Risk Interventions No flowsheet data found.

## 2019-05-20 NOTE — Plan of Care (Deleted)
  Problem: Respiratory: Goal: Respiratory symptoms related to disease process will be avoided 05/20/2019 1108 by Dolores Hoose, RN Outcome: Adequate for Discharge 05/20/2019 1107 by Dolores Hoose, RN Outcome: Adequate for Discharge   Problem: Urinary Elimination: Goal: Progression of disease will be identified and treated 05/20/2019 1108 by Dolores Hoose, RN Outcome: Adequate for Discharge 05/20/2019 1107 by Dolores Hoose, RN Outcome: Adequate for Discharge   Problem: Health Behavior/Discharge Planning: Goal: Ability to manage health-related needs will improve 05/20/2019 1108 by Dolores Hoose, RN Outcome: Adequate for Discharge 05/20/2019 1107 by Dolores Hoose, RN Outcome: Adequate for Discharge 05/20/2019 0731 by Dolores Hoose, RN Outcome: Progressing

## 2019-05-20 NOTE — Plan of Care (Signed)
  Problem: Health Behavior/Discharge Planning: Goal: Ability to manage health-related needs will improve Outcome: Progressing   

## 2019-05-20 NOTE — Plan of Care (Signed)
  Problem: Education: Goal: Knowledge of disease and its progression will improve Outcome: Adequate for Discharge   Problem: Health Behavior/Discharge Planning: Goal: Ability to manage health-related needs will improve Outcome: Adequate for Discharge   Problem: Clinical Measurements: Goal: Complications related to the disease process or treatment will be avoided or minimized Outcome: Adequate for Discharge   Problem: Activity: Goal: Activity intolerance will improve 05/20/2019 1107 by Dolores Hoose, RN Outcome: Adequate for Discharge 05/20/2019 0731 by Dolores Hoose, RN Outcome: Progressing   Problem: Fluid Volume: Goal: Fluid volume balance will be maintained or improved Outcome: Adequate for Discharge   Problem: Respiratory: Goal: Respiratory symptoms related to disease process will be avoided Outcome: Adequate for Discharge   Problem: Self-Concept: Goal: Body image disturbance will be avoided or minimized Outcome: Adequate for Discharge   Problem: Urinary Elimination: Goal: Progression of disease will be identified and treated Outcome: Adequate for Discharge   Problem: Education: Goal: Knowledge of General Education information will improve Description: Including pain rating scale, medication(s)/side effects and non-pharmacologic comfort measures Outcome: Adequate for Discharge   Problem: Health Behavior/Discharge Planning: Goal: Ability to manage health-related needs will improve 05/20/2019 1107 by Dolores Hoose, RN Outcome: Adequate for Discharge 05/20/2019 0731 by Dolores Hoose, RN Outcome: Progressing

## 2019-05-20 NOTE — Progress Notes (Signed)
Attempted to give report to facility 3x and I was able to leave message to call back but nobody did call back. I did call the facility again while transport was here to pick up patient but phone was ringing with no answer. Patient left to facility with EMS transport. Instructed transporter to relay information for the staff to call back here for report.

## 2019-05-20 NOTE — Progress Notes (Signed)
DISCHARGE NOTE SNF [name] to be discharged Skilled nursing facility per MD order. Patient verbalized understanding.  Skin clean, dry and intact without evidence of skin break down, no evidence of skin tears noted. IV catheter discontinued intact. Site without signs and symptoms of complications. Dressing and pressure applied. Pt denies pain at the site currently. No complaints noted.  Patient free of lines, drains, and wounds.   Discharge packet assembled. An After Visit Summary (AVS) was printed and given to the EMS personnel. Patient escorted via stretcher and discharged to [facility] via ambulance. Report called to accepting facility; all questions and concerns addressed.   Dolores Hoose, RN

## 2019-05-23 ENCOUNTER — Other Ambulatory Visit: Payer: Self-pay | Admitting: Family Medicine

## 2019-05-23 DIAGNOSIS — K219 Gastro-esophageal reflux disease without esophagitis: Secondary | ICD-10-CM

## 2019-05-24 ENCOUNTER — Other Ambulatory Visit: Payer: Self-pay

## 2019-05-24 ENCOUNTER — Ambulatory Visit: Payer: Medicare HMO | Attending: Family Medicine | Admitting: Family Medicine

## 2019-05-24 ENCOUNTER — Encounter: Payer: Self-pay | Admitting: Family Medicine

## 2019-05-24 DIAGNOSIS — I9589 Other hypotension: Secondary | ICD-10-CM | POA: Diagnosis not present

## 2019-05-24 DIAGNOSIS — F250 Schizoaffective disorder, bipolar type: Secondary | ICD-10-CM

## 2019-05-24 DIAGNOSIS — J302 Other seasonal allergic rhinitis: Secondary | ICD-10-CM | POA: Diagnosis not present

## 2019-05-24 DIAGNOSIS — M5136 Other intervertebral disc degeneration, lumbar region: Secondary | ICD-10-CM | POA: Diagnosis not present

## 2019-05-24 MED ORDER — FLUTICASONE PROPIONATE 50 MCG/ACT NA SUSP: 1.0000 | Freq: Every day | NASAL | 2 refills | Status: DC

## 2019-05-24 NOTE — Progress Notes (Signed)
Discuss: BP  Arthritis Pain: 8/10  Generalized  Constant  Has had several falls. High Fall Risk lives in SNF for rehab  Depression and anxiety concerns

## 2019-05-24 NOTE — Progress Notes (Signed)
Virtual Visit via Telephone Note  I connected with [name], on 05/24/2019 at 3:36 PM by telephone due to the COVID-19 pandemic and verified that I am speaking with the correct person using two identifiers.   Consent: I discussed the limitations, risks, security and privacy concerns of performing an evaluation and management service by telephone and the availability of in person appointments. I also discussed with the patient that there may be a patient responsible charge related to this service. The patient expressed understanding and agreed to proceed.   Location of Patient: SNF  Location of Provider: Home   Persons participating in Telemedicine visit: Lemuel Boodram [place]-CMA Dr. 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 He was hospitalized at [facility] from 05/14/2019 through 05/20/2019 for acute on chronic back pain secondary to fall, acute kidney injury (with a maximum creatinine of 2.3 up from a baseline of 1.7; discharge creatinine was 1.9) treated with IV fluids during his hospital course.  CT head without contrast revealed: IMPRESSION: 1. No acute intracranial abnormality. No intracranial mass, hemorrhage or edema. No skull fracture. 2. No fracture or acute subluxation within the cervical spine. 3. Degenerative change within the cervical spine, most prominent at C5-6 where there is a prominent disc-osteophytic bulge and superimposed ossification of the posterior longitudinal ligament, causing moderate central canal stenosis with possible mass effect on the anterior portion of the cervical cord.  He was discharged to skilled nursing facility and to follow-up with neurosurgery. He is undergoing PT at the SNF but  complains that his BP has been low -about 70/61. He has been dizzy on standing up he states.  Of note he is on lisinopril/HCTZ for hypertension. He is hurting all over he states including his back. Neurosurgery appointment comes up tomorrow  With regards to his bipolar disorder, he has had a therapy session with his therapist. Requests a refill of Flonase for his seasonal allergies  Past Medical History:  Diagnosis Date  . ADD (attention deficit disorder)   . Anxiety   . Arthritis    right hip  . Bipolar 1 disorder ([place])   . Blood in urine   . CKD (chronic kidney disease), stage III   . Creatinine elevation   . Dementia ([place])    "early onset" (08/04/2017)  . Depression    bipolar [facility]  . Diabetes mellitus without complication ([place])   . Family history of anesthesia complication    pt is unsure , but pt father may have been difficult to arouse   . HCAP (healthcare-associated pneumonia) 10/31/2012  . History of kidney stones   . Hypertension   . Hypogonadism male   . Liver fatty degeneration   . Microscopic hematuria    hereditary s/p Urology eval  . Osteoarthritis of right hip 11/28/2011   2012 2015 s/p THR Severe  Dr Novella Olive    . Pleural effusion 11/02/2012  . Pneumonia 10-2012  . Pneumonia, organism unspecified(486) 11/02/2012  . Polysubstance dependence, non-opioid, in remission ([place])    remote  . Primary osteoarthritis of left hip 05/22/2015  . PTSD (post-traumatic stress disorder)    SOCIAL ANXIETY DISORDER   . Suicide attempt by multiple drug overdose ([place]) 01/24/2016   Grieving his cat's death 08-10-2015  Allergies  Allergen Reactions  . Vicodin [Hydrocodone-Acetaminophen] Itching    Current Outpatient Medications on File Prior to Visit  Medication Sig Dispense Refill  . acetaminophen (TYLENOL) 325 MG tablet Take 2 tablets (650 mg total) by mouth every 6 (six) hours as needed for mild pain (or Fever >/= 101).    . ARIPiprazole (ABILIFY) 5 MG tablet TAKE 1 TABLET  BY MOUTH DAILY FOR DEPRESSION (Patient taking differently: Take 5 mg by mouth daily. ) 30 tablet 0  . atorvastatin (LIPITOR) 10 MG tablet TAKE 1 TABLET(10 MG) BY MOUTH DAILY (Patient taking differently: Take 10 mg by mouth at bedtime. ) 90 tablet 0  . buPROPion (WELLBUTRIN XL) 300 MG 24 hr tablet Take 1 tablet (300 mg total) by mouth daily. 30 tablet 0  . busPIRone (BUSPAR) 15 MG tablet Take 15 mg by mouth 2 (two) times daily.     . diclofenac sodium (VOLTAREN) 1 % GEL Apply 4 g topically 4 (four) times daily. 100 g 1  . divalproex (DEPAKOTE) 500 MG DR tablet Take 3 tablets (1,500 mg total) by mouth at bedtime. 90 tablet 0  . feeding supplement, ENSURE ENLIVE, (ENSURE ENLIVE) LIQD Take 237 mLs by mouth 2 (two) times daily between meals. 237 mL 12  . fluticasone (FLONASE) 50 MCG/ACT nasal spray SHAKE LIQUID AND USE 2 SPRAYS IN EACH NOSTRIL DAILY (Patient taking differently: Place 2 sprays into both nostrils daily. ) 16 g 2  . gabapentin (NEURONTIN) 300 MG capsule Take 1 capsule (300 mg total) by mouth 2 (two) times daily. 60 capsule 0  . insulin aspart (NOVOLOG FLEXPEN) 100 UNIT/ML FlexPen 0 to 12 units subcutaneously 3 times daily as per sliding scale (Patient taking differently: Inject 0-12 Units into the skin 2 (two) times daily. per sliding scale) 15 mL 11  . Insulin Detemir (LEVEMIR FLEXTOUCH) 100 UNIT/ML Pen Inject twice daily subcutaneously 38units in the morning and 38 units in the evening (Patient taking differently: Inject 39 Units into the skin 2 (two) times daily. ) 15 mL 6  . linagliptin (TRADJENTA) 5 MG TABS tablet Take 1 tablet (5 mg total) by mouth daily. 30 tablet 6  . lisinopril-hydrochlorothiazide (ZESTORETIC) 10-12.5 MG tablet Take 1 tablet by mouth daily. 30 tablet 6  . methocarbamol (ROBAXIN) 500 MG tablet Take 2 tablets (1,000 mg total) by mouth 4 (four) times daily. (Patient taking differently: Take 1,000 mg by mouth 2 (two) times daily. ) 120 tablet 6  . omeprazole (PRILOSEC) 40  MG capsule TAKE 1 CAPSULE(40 MG) BY MOUTH DAILY (Patient taking differently: Take 40 mg by mouth daily. ) 30 capsule 2  . QUEtiapine (SEROQUEL) 400 MG tablet Take 1 tablet (400 mg total) by mouth at bedtime. 30 tablet 11  . Testosterone (ANDROGEL) 20.25 MG/1.25GM (1.62%) GEL Apply 20.25 mg topically every morning. Apply topically under arm pit alternating with each application. 1.25 g 3  . tiZANidine (ZANAFLEX) 4 MG capsule Take 1 capsule (4 mg total) by mouth 3 (three) times daily. (Patient taking differently: Take 4 mg by mouth at bedtime as needed for muscle spasms. ) 20 capsule 0  . traMADol (ULTRAM) 50 MG tablet Take 1 tablet (50 mg total) by mouth at bedtime. (Patient taking differently: Take 50 mg by mouth at bedtime as needed (pain). ) 30 tablet 2  . traZODone (DESYREL) 150 MG tablet Take 1 tablet (150 mg total) by mouth at bedtime. 30 tablet 1  . B-D UF III MINI PEN NEEDLES 31G X 5 MM MISC  USE FOUR TIMES DAILY 100 each 12  . glucose blood (ACCU-CHEK GUIDE) test strip CHECK SUGAR FOUR TIMES DAILY 100 each 12  . lidocaine (LIDODERM) 5 % Place 1 patch onto the skin daily. Remove & Discard patch within 12 hours or as directed by MD 30 patch 0  . tadalafil (CIALIS) 5 MG tablet TAKE ONE TABLET BY MOUTH EVERY DAY FOR BPH (Patient not taking: Reported on 05/06/2019) 30 tablet 2  . tamsulosin (FLOMAX) 0.4 MG CAPS capsule Take 0.4 mg by mouth at bedtime.     No current facility-administered medications on file prior to visit.    Observations/Objective: Alert, awake, oriented x3 Not in acute distress  Lab Results  Component Value Date   HGBA1C 8.3 (H) 05/14/2019    Assessment and Plan: 1. Degenerative disc disease, lumbar Uncontrolled He is currently receiving PT at the skilled nursing facility Advised I will follow-up with him once he gets discharged meanwhile advised to keep neurosurgery appointment  2. Seasonal allergies Uncontrolled due to running out of Flonase which have refilled -  fluticasone (FLONASE) 50 MCG/ACT nasal spray; Place 1 spray into both nostrils daily. SHAKE LIQUID AND USE 2 SPRAYS IN EACH NOSTRIL DAILY  Dispense: 16 g; Refill: 2  3. Schizoaffective disorder, bipolar type ([place]) Stable Management as per psychiatry Continue with psychotherapy  4. Other specified hypotension He does take lisinopril/HCTZ for hypertension I have advised him to discuss this with his management team at the skilled nursing facility as this may need to be held in the setting of ongoing hypotension I will see him once he gets discharged   Follow Up Instructions: Patient to call for appointment after discharge   I discussed the assessment and treatment plan with the patient. The patient was provided an opportunity to ask questions and all were answered. The patient agreed with the plan and demonstrated an understanding of the instructions.   The patient was advised to call back or seek an in-person evaluation if the symptoms worsen or if the condition fails to improve as anticipated.     I provided 21 minutes total of non-face-to-face time during this encounter including median intraservice time, reviewing previous notes, investigations, ordering medications, medical decision making, coordinating care and patient verbalized understanding at the end of the visit.     [name], MD, FAAFP. [facility] and [place], Person   05/24/2019, 3:36 PM

## 2019-05-25 ENCOUNTER — Encounter: Payer: Self-pay | Admitting: Family Medicine

## 2019-05-25 DIAGNOSIS — F0390 Unspecified dementia without behavioral disturbance: Secondary | ICD-10-CM | POA: Diagnosis not present

## 2019-05-25 DIAGNOSIS — R031 Nonspecific low blood-pressure reading: Secondary | ICD-10-CM | POA: Diagnosis not present

## 2019-05-25 DIAGNOSIS — I10 Essential (primary) hypertension: Secondary | ICD-10-CM | POA: Diagnosis not present

## 2019-05-25 DIAGNOSIS — M545 Low back pain: Secondary | ICD-10-CM | POA: Diagnosis not present

## 2019-05-25 DIAGNOSIS — G8929 Other chronic pain: Secondary | ICD-10-CM | POA: Diagnosis not present

## 2019-05-25 DIAGNOSIS — R42 Dizziness and giddiness: Secondary | ICD-10-CM | POA: Diagnosis not present

## 2019-05-25 DIAGNOSIS — F411 Generalized anxiety disorder: Secondary | ICD-10-CM | POA: Diagnosis not present

## 2019-05-25 DIAGNOSIS — E119 Type 2 diabetes mellitus without complications: Secondary | ICD-10-CM | POA: Diagnosis not present

## 2019-05-26 DIAGNOSIS — N400 Enlarged prostate without lower urinary tract symptoms: Secondary | ICD-10-CM | POA: Diagnosis not present

## 2019-05-26 DIAGNOSIS — I10 Essential (primary) hypertension: Secondary | ICD-10-CM | POA: Diagnosis not present

## 2019-05-26 DIAGNOSIS — M549 Dorsalgia, unspecified: Secondary | ICD-10-CM | POA: Diagnosis not present

## 2019-05-26 DIAGNOSIS — F319 Bipolar disorder, unspecified: Secondary | ICD-10-CM | POA: Diagnosis not present

## 2019-05-27 DIAGNOSIS — I10 Essential (primary) hypertension: Secondary | ICD-10-CM | POA: Diagnosis not present

## 2019-05-27 DIAGNOSIS — R42 Dizziness and giddiness: Secondary | ICD-10-CM | POA: Diagnosis not present

## 2019-05-27 DIAGNOSIS — R031 Nonspecific low blood-pressure reading: Secondary | ICD-10-CM | POA: Diagnosis not present

## 2019-06-06 DIAGNOSIS — F411 Generalized anxiety disorder: Secondary | ICD-10-CM | POA: Diagnosis not present

## 2019-06-06 DIAGNOSIS — F250 Schizoaffective disorder, bipolar type: Secondary | ICD-10-CM | POA: Diagnosis not present

## 2019-06-06 DIAGNOSIS — F332 Major depressive disorder, recurrent severe without psychotic features: Secondary | ICD-10-CM | POA: Diagnosis not present

## 2019-06-06 DIAGNOSIS — F0390 Unspecified dementia without behavioral disturbance: Secondary | ICD-10-CM | POA: Diagnosis not present

## 2019-06-06 DIAGNOSIS — G4700 Insomnia, unspecified: Secondary | ICD-10-CM | POA: Diagnosis not present

## 2019-06-13 DIAGNOSIS — M25561 Pain in right knee: Secondary | ICD-10-CM | POA: Diagnosis not present

## 2019-06-13 DIAGNOSIS — F250 Schizoaffective disorder, bipolar type: Secondary | ICD-10-CM | POA: Diagnosis not present

## 2019-06-13 DIAGNOSIS — F0390 Unspecified dementia without behavioral disturbance: Secondary | ICD-10-CM | POA: Diagnosis not present

## 2019-06-13 DIAGNOSIS — F332 Major depressive disorder, recurrent severe without psychotic features: Secondary | ICD-10-CM | POA: Diagnosis not present

## 2019-06-13 DIAGNOSIS — R031 Nonspecific low blood-pressure reading: Secondary | ICD-10-CM | POA: Diagnosis not present

## 2019-06-13 DIAGNOSIS — R531 Weakness: Secondary | ICD-10-CM | POA: Diagnosis not present

## 2019-06-13 DIAGNOSIS — M25562 Pain in left knee: Secondary | ICD-10-CM | POA: Diagnosis not present

## 2019-06-13 DIAGNOSIS — F411 Generalized anxiety disorder: Secondary | ICD-10-CM | POA: Diagnosis not present

## 2019-06-14 DIAGNOSIS — R42 Dizziness and giddiness: Secondary | ICD-10-CM | POA: Diagnosis not present

## 2019-06-14 DIAGNOSIS — F411 Generalized anxiety disorder: Secondary | ICD-10-CM | POA: Diagnosis not present

## 2019-06-14 DIAGNOSIS — G8929 Other chronic pain: Secondary | ICD-10-CM | POA: Diagnosis not present

## 2019-06-14 DIAGNOSIS — R5381 Other malaise: Secondary | ICD-10-CM | POA: Diagnosis not present

## 2019-06-14 DIAGNOSIS — R52 Pain, unspecified: Secondary | ICD-10-CM | POA: Diagnosis not present

## 2019-06-14 DIAGNOSIS — R531 Weakness: Secondary | ICD-10-CM | POA: Diagnosis not present

## 2019-06-14 DIAGNOSIS — L02212 Cutaneous abscess of back [any part, except buttock]: Secondary | ICD-10-CM | POA: Diagnosis not present

## 2019-06-14 DIAGNOSIS — R262 Difficulty in walking, not elsewhere classified: Secondary | ICD-10-CM | POA: Diagnosis not present

## 2019-06-14 DIAGNOSIS — M6281 Muscle weakness (generalized): Secondary | ICD-10-CM | POA: Diagnosis not present

## 2019-06-15 ENCOUNTER — Ambulatory Visit: Payer: Medicare HMO | Admitting: Neurology

## 2019-06-15 DIAGNOSIS — F05 Delirium due to known physiological condition: Secondary | ICD-10-CM | POA: Diagnosis not present

## 2019-06-15 DIAGNOSIS — M6281 Muscle weakness (generalized): Secondary | ICD-10-CM | POA: Diagnosis not present

## 2019-06-15 DIAGNOSIS — W19XXXA Unspecified fall, initial encounter: Secondary | ICD-10-CM | POA: Diagnosis not present

## 2019-06-15 DIAGNOSIS — L02212 Cutaneous abscess of back [any part, except buttock]: Secondary | ICD-10-CM | POA: Diagnosis not present

## 2019-06-15 DIAGNOSIS — G8929 Other chronic pain: Secondary | ICD-10-CM | POA: Diagnosis not present

## 2019-06-16 DIAGNOSIS — F05 Delirium due to known physiological condition: Secondary | ICD-10-CM | POA: Diagnosis not present

## 2019-06-16 DIAGNOSIS — W19XXXA Unspecified fall, initial encounter: Secondary | ICD-10-CM | POA: Diagnosis not present

## 2019-06-16 DIAGNOSIS — M6281 Muscle weakness (generalized): Secondary | ICD-10-CM | POA: Diagnosis not present

## 2019-06-16 DIAGNOSIS — R262 Difficulty in walking, not elsewhere classified: Secondary | ICD-10-CM | POA: Diagnosis not present

## 2019-06-16 DIAGNOSIS — L02212 Cutaneous abscess of back [any part, except buttock]: Secondary | ICD-10-CM | POA: Diagnosis not present

## 2019-06-16 DIAGNOSIS — G8929 Other chronic pain: Secondary | ICD-10-CM | POA: Diagnosis not present

## 2019-06-16 DIAGNOSIS — R5381 Other malaise: Secondary | ICD-10-CM | POA: Diagnosis not present

## 2019-06-17 DIAGNOSIS — R42 Dizziness and giddiness: Secondary | ICD-10-CM | POA: Diagnosis not present

## 2019-06-17 DIAGNOSIS — L02212 Cutaneous abscess of back [any part, except buttock]: Secondary | ICD-10-CM | POA: Diagnosis not present

## 2019-06-17 DIAGNOSIS — G8929 Other chronic pain: Secondary | ICD-10-CM | POA: Diagnosis not present

## 2019-06-17 DIAGNOSIS — R531 Weakness: Secondary | ICD-10-CM | POA: Diagnosis not present

## 2019-06-20 DIAGNOSIS — L02219 Cutaneous abscess of trunk, unspecified: Secondary | ICD-10-CM | POA: Diagnosis not present

## 2019-06-20 DIAGNOSIS — N3942 Incontinence without sensory awareness: Secondary | ICD-10-CM | POA: Diagnosis not present

## 2019-06-20 DIAGNOSIS — M545 Low back pain: Secondary | ICD-10-CM | POA: Diagnosis not present

## 2019-06-20 DIAGNOSIS — N400 Enlarged prostate without lower urinary tract symptoms: Secondary | ICD-10-CM | POA: Diagnosis not present

## 2019-06-20 DIAGNOSIS — F411 Generalized anxiety disorder: Secondary | ICD-10-CM | POA: Diagnosis not present

## 2019-06-20 DIAGNOSIS — M6281 Muscle weakness (generalized): Secondary | ICD-10-CM | POA: Diagnosis not present

## 2019-06-20 DIAGNOSIS — F250 Schizoaffective disorder, bipolar type: Secondary | ICD-10-CM | POA: Diagnosis not present

## 2019-06-20 DIAGNOSIS — G8929 Other chronic pain: Secondary | ICD-10-CM | POA: Diagnosis not present

## 2019-06-21 DIAGNOSIS — R5381 Other malaise: Secondary | ICD-10-CM | POA: Diagnosis not present

## 2019-06-21 DIAGNOSIS — R531 Weakness: Secondary | ICD-10-CM | POA: Diagnosis not present

## 2019-06-21 DIAGNOSIS — L02212 Cutaneous abscess of back [any part, except buttock]: Secondary | ICD-10-CM | POA: Diagnosis not present

## 2019-06-21 DIAGNOSIS — M6281 Muscle weakness (generalized): Secondary | ICD-10-CM | POA: Diagnosis not present

## 2019-06-21 DIAGNOSIS — R262 Difficulty in walking, not elsewhere classified: Secondary | ICD-10-CM | POA: Diagnosis not present

## 2019-06-21 DIAGNOSIS — G8929 Other chronic pain: Secondary | ICD-10-CM | POA: Diagnosis not present

## 2019-06-22 DIAGNOSIS — R42 Dizziness and giddiness: Secondary | ICD-10-CM | POA: Diagnosis not present

## 2019-06-22 DIAGNOSIS — G8929 Other chronic pain: Secondary | ICD-10-CM | POA: Diagnosis not present

## 2019-06-22 DIAGNOSIS — R531 Weakness: Secondary | ICD-10-CM | POA: Diagnosis not present

## 2019-06-22 DIAGNOSIS — L02212 Cutaneous abscess of back [any part, except buttock]: Secondary | ICD-10-CM | POA: Diagnosis not present

## 2019-06-23 DIAGNOSIS — L98429 Non-pressure chronic ulcer of back with unspecified severity: Secondary | ICD-10-CM | POA: Diagnosis not present

## 2019-06-23 DIAGNOSIS — R5381 Other malaise: Secondary | ICD-10-CM | POA: Diagnosis not present

## 2019-06-23 DIAGNOSIS — G8929 Other chronic pain: Secondary | ICD-10-CM | POA: Diagnosis not present

## 2019-06-23 DIAGNOSIS — R531 Weakness: Secondary | ICD-10-CM | POA: Diagnosis not present

## 2019-06-23 DIAGNOSIS — R42 Dizziness and giddiness: Secondary | ICD-10-CM | POA: Diagnosis not present

## 2019-06-23 DIAGNOSIS — L02212 Cutaneous abscess of back [any part, except buttock]: Secondary | ICD-10-CM | POA: Diagnosis not present

## 2019-06-23 DIAGNOSIS — M170 Bilateral primary osteoarthritis of knee: Secondary | ICD-10-CM | POA: Diagnosis not present

## 2019-06-23 DIAGNOSIS — M6281 Muscle weakness (generalized): Secondary | ICD-10-CM | POA: Diagnosis not present

## 2019-06-23 DIAGNOSIS — R262 Difficulty in walking, not elsewhere classified: Secondary | ICD-10-CM | POA: Diagnosis not present

## 2019-06-29 DIAGNOSIS — G8929 Other chronic pain: Secondary | ICD-10-CM | POA: Diagnosis not present

## 2019-06-29 DIAGNOSIS — L720 Epidermal cyst: Secondary | ICD-10-CM | POA: Diagnosis not present

## 2019-06-29 DIAGNOSIS — R251 Tremor, unspecified: Secondary | ICD-10-CM | POA: Diagnosis not present

## 2019-06-29 DIAGNOSIS — M6281 Muscle weakness (generalized): Secondary | ICD-10-CM | POA: Diagnosis not present

## 2019-06-29 DIAGNOSIS — F0390 Unspecified dementia without behavioral disturbance: Secondary | ICD-10-CM | POA: Diagnosis not present

## 2019-06-29 DIAGNOSIS — F250 Schizoaffective disorder, bipolar type: Secondary | ICD-10-CM | POA: Diagnosis not present

## 2019-06-29 DIAGNOSIS — F332 Major depressive disorder, recurrent severe without psychotic features: Secondary | ICD-10-CM | POA: Diagnosis not present

## 2019-06-29 DIAGNOSIS — F411 Generalized anxiety disorder: Secondary | ICD-10-CM | POA: Diagnosis not present

## 2019-07-01 DIAGNOSIS — L98429 Non-pressure chronic ulcer of back with unspecified severity: Secondary | ICD-10-CM | POA: Diagnosis not present

## 2019-07-05 DIAGNOSIS — R262 Difficulty in walking, not elsewhere classified: Secondary | ICD-10-CM | POA: Diagnosis not present

## 2019-07-05 DIAGNOSIS — R5381 Other malaise: Secondary | ICD-10-CM | POA: Diagnosis not present

## 2019-07-05 DIAGNOSIS — M6281 Muscle weakness (generalized): Secondary | ICD-10-CM | POA: Diagnosis not present

## 2019-07-05 DIAGNOSIS — G8929 Other chronic pain: Secondary | ICD-10-CM | POA: Diagnosis not present

## 2019-07-07 DIAGNOSIS — G8929 Other chronic pain: Secondary | ICD-10-CM | POA: Diagnosis not present

## 2019-07-07 DIAGNOSIS — M6281 Muscle weakness (generalized): Secondary | ICD-10-CM | POA: Diagnosis not present

## 2019-07-07 DIAGNOSIS — L720 Epidermal cyst: Secondary | ICD-10-CM | POA: Diagnosis not present

## 2019-07-07 DIAGNOSIS — F332 Major depressive disorder, recurrent severe without psychotic features: Secondary | ICD-10-CM | POA: Diagnosis not present

## 2019-07-07 DIAGNOSIS — L02212 Cutaneous abscess of back [any part, except buttock]: Secondary | ICD-10-CM | POA: Diagnosis not present

## 2019-07-07 DIAGNOSIS — F411 Generalized anxiety disorder: Secondary | ICD-10-CM | POA: Diagnosis not present

## 2019-07-07 DIAGNOSIS — R251 Tremor, unspecified: Secondary | ICD-10-CM | POA: Diagnosis not present

## 2019-07-07 DIAGNOSIS — L98429 Non-pressure chronic ulcer of back with unspecified severity: Secondary | ICD-10-CM | POA: Diagnosis not present

## 2019-07-12 DIAGNOSIS — R5381 Other malaise: Secondary | ICD-10-CM | POA: Diagnosis not present

## 2019-07-12 DIAGNOSIS — R262 Difficulty in walking, not elsewhere classified: Secondary | ICD-10-CM | POA: Diagnosis not present

## 2019-07-12 DIAGNOSIS — G8929 Other chronic pain: Secondary | ICD-10-CM | POA: Diagnosis not present

## 2019-07-12 DIAGNOSIS — M6281 Muscle weakness (generalized): Secondary | ICD-10-CM | POA: Diagnosis not present

## 2019-07-14 DIAGNOSIS — L98429 Non-pressure chronic ulcer of back with unspecified severity: Secondary | ICD-10-CM | POA: Diagnosis not present

## 2019-07-15 DIAGNOSIS — F4310 Post-traumatic stress disorder, unspecified: Secondary | ICD-10-CM | POA: Diagnosis not present

## 2019-07-21 DIAGNOSIS — L98429 Non-pressure chronic ulcer of back with unspecified severity: Secondary | ICD-10-CM | POA: Diagnosis not present

## 2019-07-21 DIAGNOSIS — I10 Essential (primary) hypertension: Secondary | ICD-10-CM | POA: Diagnosis not present

## 2019-07-21 DIAGNOSIS — F0390 Unspecified dementia without behavioral disturbance: Secondary | ICD-10-CM | POA: Diagnosis not present

## 2019-07-21 DIAGNOSIS — F250 Schizoaffective disorder, bipolar type: Secondary | ICD-10-CM | POA: Diagnosis not present

## 2019-07-21 DIAGNOSIS — K219 Gastro-esophageal reflux disease without esophagitis: Secondary | ICD-10-CM | POA: Diagnosis not present

## 2019-07-21 DIAGNOSIS — F339 Major depressive disorder, recurrent, unspecified: Secondary | ICD-10-CM | POA: Diagnosis not present

## 2019-07-21 DIAGNOSIS — M6281 Muscle weakness (generalized): Secondary | ICD-10-CM | POA: Diagnosis not present

## 2019-07-21 DIAGNOSIS — F411 Generalized anxiety disorder: Secondary | ICD-10-CM | POA: Diagnosis not present

## 2019-07-21 DIAGNOSIS — G8929 Other chronic pain: Secondary | ICD-10-CM | POA: Diagnosis not present

## 2019-07-25 ENCOUNTER — Other Ambulatory Visit: Payer: Self-pay

## 2019-07-25 DIAGNOSIS — F0390 Unspecified dementia without behavioral disturbance: Secondary | ICD-10-CM | POA: Diagnosis not present

## 2019-07-25 DIAGNOSIS — M6281 Muscle weakness (generalized): Secondary | ICD-10-CM | POA: Diagnosis not present

## 2019-07-25 DIAGNOSIS — G9349 Other encephalopathy: Secondary | ICD-10-CM | POA: Diagnosis not present

## 2019-07-25 DIAGNOSIS — G8929 Other chronic pain: Secondary | ICD-10-CM | POA: Diagnosis not present

## 2019-07-25 DIAGNOSIS — F4310 Post-traumatic stress disorder, unspecified: Secondary | ICD-10-CM | POA: Diagnosis not present

## 2019-07-25 NOTE — Patient Outreach (Signed)
[facility]) Care Management  07/25/2019  [name] 08/30/1965 [id number]     Transition of Care Referral  Referral Date: 07/25/2019 Referral Source: [facility] Discharge Report Date of Discharge: 07/22/2019 Facility: [place]: [facility]    Referral received. Transition of care calls being completed via EMMI-automated calls. RN CM will outreach patient for any red flags received.     Plan: RN CM will close case at this time.    Enzo Montgomery, RN,BSN,CCM [place] Management Telephonic Care Management Coordinator Direct Phone: [phone] Toll Free: [phone] Fax: [phone]

## 2019-07-26 DIAGNOSIS — I129 Hypertensive chronic kidney disease with stage 1 through stage 4 chronic kidney disease, or unspecified chronic kidney disease: Secondary | ICD-10-CM | POA: Diagnosis not present

## 2019-07-26 DIAGNOSIS — N183 Chronic kidney disease, stage 3 unspecified: Secondary | ICD-10-CM | POA: Diagnosis not present

## 2019-07-26 DIAGNOSIS — Z48817 Encounter for surgical aftercare following surgery on the skin and subcutaneous tissue: Secondary | ICD-10-CM | POA: Diagnosis not present

## 2019-07-26 DIAGNOSIS — E1140 Type 2 diabetes mellitus with diabetic neuropathy, unspecified: Secondary | ICD-10-CM | POA: Diagnosis not present

## 2019-07-26 DIAGNOSIS — G8929 Other chronic pain: Secondary | ICD-10-CM | POA: Diagnosis not present

## 2019-07-26 DIAGNOSIS — F0390 Unspecified dementia without behavioral disturbance: Secondary | ICD-10-CM | POA: Diagnosis not present

## 2019-07-26 DIAGNOSIS — L723 Sebaceous cyst: Secondary | ICD-10-CM | POA: Diagnosis not present

## 2019-07-26 DIAGNOSIS — E1122 Type 2 diabetes mellitus with diabetic chronic kidney disease: Secondary | ICD-10-CM | POA: Diagnosis not present

## 2019-07-26 DIAGNOSIS — Z4801 Encounter for change or removal of surgical wound dressing: Secondary | ICD-10-CM | POA: Diagnosis not present

## 2019-07-27 DIAGNOSIS — R972 Elevated prostate specific antigen [PSA]: Secondary | ICD-10-CM | POA: Diagnosis not present

## 2019-07-27 DIAGNOSIS — R350 Frequency of micturition: Secondary | ICD-10-CM | POA: Diagnosis not present

## 2019-07-27 DIAGNOSIS — N401 Enlarged prostate with lower urinary tract symptoms: Secondary | ICD-10-CM | POA: Diagnosis not present

## 2019-07-27 DIAGNOSIS — N5201 Erectile dysfunction due to arterial insufficiency: Secondary | ICD-10-CM | POA: Diagnosis not present

## 2019-07-30 DIAGNOSIS — F0390 Unspecified dementia without behavioral disturbance: Secondary | ICD-10-CM | POA: Diagnosis not present

## 2019-07-30 DIAGNOSIS — E1140 Type 2 diabetes mellitus with diabetic neuropathy, unspecified: Secondary | ICD-10-CM | POA: Diagnosis not present

## 2019-07-30 DIAGNOSIS — N183 Chronic kidney disease, stage 3 unspecified: Secondary | ICD-10-CM | POA: Diagnosis not present

## 2019-07-30 DIAGNOSIS — I129 Hypertensive chronic kidney disease with stage 1 through stage 4 chronic kidney disease, or unspecified chronic kidney disease: Secondary | ICD-10-CM | POA: Diagnosis not present

## 2019-07-30 DIAGNOSIS — E1122 Type 2 diabetes mellitus with diabetic chronic kidney disease: Secondary | ICD-10-CM | POA: Diagnosis not present

## 2019-07-30 DIAGNOSIS — G8929 Other chronic pain: Secondary | ICD-10-CM | POA: Diagnosis not present

## 2019-07-30 DIAGNOSIS — Z48817 Encounter for surgical aftercare following surgery on the skin and subcutaneous tissue: Secondary | ICD-10-CM | POA: Diagnosis not present

## 2019-07-30 DIAGNOSIS — L723 Sebaceous cyst: Secondary | ICD-10-CM | POA: Diagnosis not present

## 2019-07-30 DIAGNOSIS — Z4801 Encounter for change or removal of surgical wound dressing: Secondary | ICD-10-CM | POA: Diagnosis not present

## 2019-08-01 DIAGNOSIS — F0390 Unspecified dementia without behavioral disturbance: Secondary | ICD-10-CM | POA: Diagnosis not present

## 2019-08-01 DIAGNOSIS — E1140 Type 2 diabetes mellitus with diabetic neuropathy, unspecified: Secondary | ICD-10-CM | POA: Diagnosis not present

## 2019-08-01 DIAGNOSIS — F4310 Post-traumatic stress disorder, unspecified: Secondary | ICD-10-CM | POA: Diagnosis not present

## 2019-08-01 DIAGNOSIS — L723 Sebaceous cyst: Secondary | ICD-10-CM | POA: Diagnosis not present

## 2019-08-01 DIAGNOSIS — G8929 Other chronic pain: Secondary | ICD-10-CM | POA: Diagnosis not present

## 2019-08-01 DIAGNOSIS — N183 Chronic kidney disease, stage 3 unspecified: Secondary | ICD-10-CM | POA: Diagnosis not present

## 2019-08-01 DIAGNOSIS — I129 Hypertensive chronic kidney disease with stage 1 through stage 4 chronic kidney disease, or unspecified chronic kidney disease: Secondary | ICD-10-CM | POA: Diagnosis not present

## 2019-08-01 DIAGNOSIS — Z48817 Encounter for surgical aftercare following surgery on the skin and subcutaneous tissue: Secondary | ICD-10-CM | POA: Diagnosis not present

## 2019-08-01 DIAGNOSIS — E1122 Type 2 diabetes mellitus with diabetic chronic kidney disease: Secondary | ICD-10-CM | POA: Diagnosis not present

## 2019-08-01 DIAGNOSIS — Z4801 Encounter for change or removal of surgical wound dressing: Secondary | ICD-10-CM | POA: Diagnosis not present

## 2019-08-02 DIAGNOSIS — N183 Chronic kidney disease, stage 3 unspecified: Secondary | ICD-10-CM | POA: Diagnosis not present

## 2019-08-02 DIAGNOSIS — I129 Hypertensive chronic kidney disease with stage 1 through stage 4 chronic kidney disease, or unspecified chronic kidney disease: Secondary | ICD-10-CM | POA: Diagnosis not present

## 2019-08-02 DIAGNOSIS — G8929 Other chronic pain: Secondary | ICD-10-CM | POA: Diagnosis not present

## 2019-08-02 DIAGNOSIS — Z4801 Encounter for change or removal of surgical wound dressing: Secondary | ICD-10-CM | POA: Diagnosis not present

## 2019-08-02 DIAGNOSIS — Z48817 Encounter for surgical aftercare following surgery on the skin and subcutaneous tissue: Secondary | ICD-10-CM | POA: Diagnosis not present

## 2019-08-02 DIAGNOSIS — F0390 Unspecified dementia without behavioral disturbance: Secondary | ICD-10-CM | POA: Diagnosis not present

## 2019-08-02 DIAGNOSIS — L723 Sebaceous cyst: Secondary | ICD-10-CM | POA: Diagnosis not present

## 2019-08-02 DIAGNOSIS — E1140 Type 2 diabetes mellitus with diabetic neuropathy, unspecified: Secondary | ICD-10-CM | POA: Diagnosis not present

## 2019-08-02 DIAGNOSIS — E1122 Type 2 diabetes mellitus with diabetic chronic kidney disease: Secondary | ICD-10-CM | POA: Diagnosis not present

## 2019-08-03 DIAGNOSIS — F0390 Unspecified dementia without behavioral disturbance: Secondary | ICD-10-CM | POA: Diagnosis not present

## 2019-08-03 DIAGNOSIS — Z48817 Encounter for surgical aftercare following surgery on the skin and subcutaneous tissue: Secondary | ICD-10-CM | POA: Diagnosis not present

## 2019-08-03 DIAGNOSIS — E1140 Type 2 diabetes mellitus with diabetic neuropathy, unspecified: Secondary | ICD-10-CM | POA: Diagnosis not present

## 2019-08-03 DIAGNOSIS — E1122 Type 2 diabetes mellitus with diabetic chronic kidney disease: Secondary | ICD-10-CM | POA: Diagnosis not present

## 2019-08-03 DIAGNOSIS — G8929 Other chronic pain: Secondary | ICD-10-CM | POA: Diagnosis not present

## 2019-08-03 DIAGNOSIS — N183 Chronic kidney disease, stage 3 unspecified: Secondary | ICD-10-CM | POA: Diagnosis not present

## 2019-08-03 DIAGNOSIS — Z4801 Encounter for change or removal of surgical wound dressing: Secondary | ICD-10-CM | POA: Diagnosis not present

## 2019-08-03 DIAGNOSIS — L723 Sebaceous cyst: Secondary | ICD-10-CM | POA: Diagnosis not present

## 2019-08-03 DIAGNOSIS — I129 Hypertensive chronic kidney disease with stage 1 through stage 4 chronic kidney disease, or unspecified chronic kidney disease: Secondary | ICD-10-CM | POA: Diagnosis not present

## 2019-08-04 ENCOUNTER — Telehealth: Payer: Self-pay | Admitting: Family Medicine

## 2019-08-04 NOTE — Telephone Encounter (Signed)
Gracie form [facility] called requesting verbal orders for PT 2 times a week for 4 weeks and 1 time a week for 4 weeks.

## 2019-08-05 DIAGNOSIS — F0390 Unspecified dementia without behavioral disturbance: Secondary | ICD-10-CM | POA: Diagnosis not present

## 2019-08-05 DIAGNOSIS — Z4801 Encounter for change or removal of surgical wound dressing: Secondary | ICD-10-CM | POA: Diagnosis not present

## 2019-08-05 DIAGNOSIS — Z48817 Encounter for surgical aftercare following surgery on the skin and subcutaneous tissue: Secondary | ICD-10-CM | POA: Diagnosis not present

## 2019-08-05 DIAGNOSIS — I129 Hypertensive chronic kidney disease with stage 1 through stage 4 chronic kidney disease, or unspecified chronic kidney disease: Secondary | ICD-10-CM | POA: Diagnosis not present

## 2019-08-05 DIAGNOSIS — G8929 Other chronic pain: Secondary | ICD-10-CM | POA: Diagnosis not present

## 2019-08-05 DIAGNOSIS — L723 Sebaceous cyst: Secondary | ICD-10-CM | POA: Diagnosis not present

## 2019-08-05 DIAGNOSIS — E1122 Type 2 diabetes mellitus with diabetic chronic kidney disease: Secondary | ICD-10-CM | POA: Diagnosis not present

## 2019-08-05 DIAGNOSIS — N183 Chronic kidney disease, stage 3 unspecified: Secondary | ICD-10-CM | POA: Diagnosis not present

## 2019-08-05 DIAGNOSIS — E1140 Type 2 diabetes mellitus with diabetic neuropathy, unspecified: Secondary | ICD-10-CM | POA: Diagnosis not present

## 2019-08-05 NOTE — Telephone Encounter (Signed)
Terri Piedra was called and given verbal orders for the patient as listed.

## 2019-08-08 DIAGNOSIS — F0390 Unspecified dementia without behavioral disturbance: Secondary | ICD-10-CM | POA: Diagnosis not present

## 2019-08-08 DIAGNOSIS — Z48817 Encounter for surgical aftercare following surgery on the skin and subcutaneous tissue: Secondary | ICD-10-CM | POA: Diagnosis not present

## 2019-08-08 DIAGNOSIS — Z4801 Encounter for change or removal of surgical wound dressing: Secondary | ICD-10-CM | POA: Diagnosis not present

## 2019-08-08 DIAGNOSIS — I129 Hypertensive chronic kidney disease with stage 1 through stage 4 chronic kidney disease, or unspecified chronic kidney disease: Secondary | ICD-10-CM | POA: Diagnosis not present

## 2019-08-08 DIAGNOSIS — N183 Chronic kidney disease, stage 3 unspecified: Secondary | ICD-10-CM | POA: Diagnosis not present

## 2019-08-08 DIAGNOSIS — L723 Sebaceous cyst: Secondary | ICD-10-CM | POA: Diagnosis not present

## 2019-08-08 DIAGNOSIS — G8929 Other chronic pain: Secondary | ICD-10-CM | POA: Diagnosis not present

## 2019-08-08 DIAGNOSIS — E1122 Type 2 diabetes mellitus with diabetic chronic kidney disease: Secondary | ICD-10-CM | POA: Diagnosis not present

## 2019-08-08 DIAGNOSIS — E1140 Type 2 diabetes mellitus with diabetic neuropathy, unspecified: Secondary | ICD-10-CM | POA: Diagnosis not present

## 2019-08-09 DIAGNOSIS — G8929 Other chronic pain: Secondary | ICD-10-CM | POA: Diagnosis not present

## 2019-08-09 DIAGNOSIS — L723 Sebaceous cyst: Secondary | ICD-10-CM | POA: Diagnosis not present

## 2019-08-09 DIAGNOSIS — Z4801 Encounter for change or removal of surgical wound dressing: Secondary | ICD-10-CM | POA: Diagnosis not present

## 2019-08-09 DIAGNOSIS — Z48817 Encounter for surgical aftercare following surgery on the skin and subcutaneous tissue: Secondary | ICD-10-CM | POA: Diagnosis not present

## 2019-08-09 DIAGNOSIS — I129 Hypertensive chronic kidney disease with stage 1 through stage 4 chronic kidney disease, or unspecified chronic kidney disease: Secondary | ICD-10-CM | POA: Diagnosis not present

## 2019-08-09 DIAGNOSIS — N183 Chronic kidney disease, stage 3 unspecified: Secondary | ICD-10-CM | POA: Diagnosis not present

## 2019-08-09 DIAGNOSIS — E1122 Type 2 diabetes mellitus with diabetic chronic kidney disease: Secondary | ICD-10-CM | POA: Diagnosis not present

## 2019-08-09 DIAGNOSIS — F0390 Unspecified dementia without behavioral disturbance: Secondary | ICD-10-CM | POA: Diagnosis not present

## 2019-08-09 DIAGNOSIS — E1140 Type 2 diabetes mellitus with diabetic neuropathy, unspecified: Secondary | ICD-10-CM | POA: Diagnosis not present

## 2019-08-10 DIAGNOSIS — L723 Sebaceous cyst: Secondary | ICD-10-CM | POA: Diagnosis not present

## 2019-08-10 DIAGNOSIS — E1122 Type 2 diabetes mellitus with diabetic chronic kidney disease: Secondary | ICD-10-CM | POA: Diagnosis not present

## 2019-08-10 DIAGNOSIS — I129 Hypertensive chronic kidney disease with stage 1 through stage 4 chronic kidney disease, or unspecified chronic kidney disease: Secondary | ICD-10-CM | POA: Diagnosis not present

## 2019-08-10 DIAGNOSIS — N183 Chronic kidney disease, stage 3 unspecified: Secondary | ICD-10-CM | POA: Diagnosis not present

## 2019-08-10 DIAGNOSIS — E1140 Type 2 diabetes mellitus with diabetic neuropathy, unspecified: Secondary | ICD-10-CM | POA: Diagnosis not present

## 2019-08-10 DIAGNOSIS — Z4801 Encounter for change or removal of surgical wound dressing: Secondary | ICD-10-CM | POA: Diagnosis not present

## 2019-08-10 DIAGNOSIS — Z48817 Encounter for surgical aftercare following surgery on the skin and subcutaneous tissue: Secondary | ICD-10-CM | POA: Diagnosis not present

## 2019-08-10 DIAGNOSIS — F0390 Unspecified dementia without behavioral disturbance: Secondary | ICD-10-CM | POA: Diagnosis not present

## 2019-08-10 DIAGNOSIS — G8929 Other chronic pain: Secondary | ICD-10-CM | POA: Diagnosis not present

## 2019-08-11 ENCOUNTER — Other Ambulatory Visit: Payer: Self-pay

## 2019-08-11 ENCOUNTER — Ambulatory Visit: Payer: Medicare HMO | Attending: Family Medicine | Admitting: Family Medicine

## 2019-08-11 DIAGNOSIS — E1121 Type 2 diabetes mellitus with diabetic nephropathy: Secondary | ICD-10-CM | POA: Diagnosis not present

## 2019-08-11 DIAGNOSIS — N1831 Chronic kidney disease, stage 3a: Secondary | ICD-10-CM

## 2019-08-11 DIAGNOSIS — N401 Enlarged prostate with lower urinary tract symptoms: Secondary | ICD-10-CM

## 2019-08-11 DIAGNOSIS — Z794 Long term (current) use of insulin: Secondary | ICD-10-CM | POA: Diagnosis not present

## 2019-08-11 DIAGNOSIS — Z48817 Encounter for surgical aftercare following surgery on the skin and subcutaneous tissue: Secondary | ICD-10-CM | POA: Diagnosis not present

## 2019-08-11 DIAGNOSIS — R338 Other retention of urine: Secondary | ICD-10-CM

## 2019-08-11 DIAGNOSIS — N183 Chronic kidney disease, stage 3 unspecified: Secondary | ICD-10-CM | POA: Diagnosis not present

## 2019-08-11 DIAGNOSIS — E1122 Type 2 diabetes mellitus with diabetic chronic kidney disease: Secondary | ICD-10-CM | POA: Diagnosis not present

## 2019-08-11 DIAGNOSIS — L723 Sebaceous cyst: Secondary | ICD-10-CM | POA: Diagnosis not present

## 2019-08-11 DIAGNOSIS — F418 Other specified anxiety disorders: Secondary | ICD-10-CM

## 2019-08-11 DIAGNOSIS — E1140 Type 2 diabetes mellitus with diabetic neuropathy, unspecified: Secondary | ICD-10-CM | POA: Diagnosis not present

## 2019-08-11 DIAGNOSIS — I129 Hypertensive chronic kidney disease with stage 1 through stage 4 chronic kidney disease, or unspecified chronic kidney disease: Secondary | ICD-10-CM | POA: Diagnosis not present

## 2019-08-11 DIAGNOSIS — G8929 Other chronic pain: Secondary | ICD-10-CM | POA: Diagnosis not present

## 2019-08-11 DIAGNOSIS — M5136 Other intervertebral disc degeneration, lumbar region: Secondary | ICD-10-CM | POA: Diagnosis not present

## 2019-08-11 DIAGNOSIS — Z4801 Encounter for change or removal of surgical wound dressing: Secondary | ICD-10-CM | POA: Diagnosis not present

## 2019-08-11 DIAGNOSIS — F0390 Unspecified dementia without behavioral disturbance: Secondary | ICD-10-CM | POA: Diagnosis not present

## 2019-08-11 NOTE — Progress Notes (Signed)
Virtual Visit via Telephone Note  I connected with [name], on 08/11/2019 at 9:34 AM by telephone due to the COVID-19 pandemic and verified that I am speaking with the correct person using two identifiers.   Consent: I discussed the limitations, risks, security and privacy concerns of performing an evaluation and management service by telephone and the availability of in person appointments. I also discussed with the patient that there may be a patient responsible charge related to this service. The patient expressed understanding and agreed to proceed.   Location of Patient: Home  Location of Provider: Clinic   Persons participating in Telemedicine visit: Ramello Cordial [place]-CMA Dr. 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 He was hospitalized at [facility] from 05/14/2019 through 05/20/2019 for acute on chronic back pain secondary to fall, acute kidney injury (with a maximum creatinine of 2.3 up from a baseline of 1.7; discharge creatinine was 1.9) treated with IV fluids during his hospital course.  He got discharged from SNF the first week of this month. States he is a bit shaky and needs a rollator to get around; he just fell today while he was trying to sweep the floor. He has a nurse who come 3x/week for wound dressing, 2 days a week he has PT. He does not have an aide. He uses [facility]. He has neck and back pain which is chronic rated as 7/10 and is sharp. His knee caps are sore and his legs are starting to buckle  His BPH is under control - he had a visit to the Urologist and underwent a procedure which provided some relief in lower urinary symptoms.  Blood sugars have been 180-284 at night, 120-140 in the  morning,  Past Medical History:  Diagnosis Date  . ADD (attention deficit disorder)   . Anxiety   . Arthritis    right hip  . Bipolar 1 disorder ([place])   . Blood in urine   . CKD (chronic kidney disease), stage III   . Creatinine elevation   . Dementia ([place])    "early onset" (08/04/2017)  . Depression    bipolar [facility]  . Diabetes mellitus without complication ([place])   . Family history of anesthesia complication    pt is unsure , but pt father may have been difficult to arouse   . HCAP (healthcare-associated pneumonia) 10/31/2012  . History of kidney stones   . Hypertension   . Hypogonadism male   . Liver fatty degeneration   . Microscopic hematuria    hereditary s/p Urology eval  . Osteoarthritis of right hip 11/28/2011   2012 2015 s/p THR Severe  Dr Novella Olive    . Pleural effusion 11/02/2012  . Pneumonia 10-2012  . Pneumonia, organism unspecified(486) 11/02/2012  . Polysubstance dependence, non-opioid, in remission ([place])    remote  . Primary osteoarthritis of left hip 05/22/2015  . PTSD (post-traumatic stress disorder)    SOCIAL ANXIETY DISORDER   . Suicide attempt by multiple drug overdose ([place]) 2016/01/20   Grieving his cat's death 08/06/2015   Allergies  Allergen Reactions  . Vicodin [Hydrocodone-Acetaminophen] Itching    Current Outpatient Medications on File Prior to Visit  Medication Sig Dispense Refill  . acetaminophen (TYLENOL) 325 MG  tablet Take 2 tablets (650 mg total) by mouth every 6 (six) hours as needed for mild pain (or Fever >/= 101).    . ARIPiprazole (ABILIFY) 5 MG tablet TAKE 1 TABLET BY MOUTH DAILY FOR DEPRESSION (Patient taking differently: Take 5 mg by mouth daily. ) 30 tablet 0  . atorvastatin (LIPITOR) 10 MG tablet TAKE 1 TABLET(10 MG) BY MOUTH DAILY (Patient taking differently: Take 10 mg by mouth at bedtime. ) 90 tablet 0  . B-D UF III MINI PEN NEEDLES 31G X 5 MM MISC USE FOUR TIMES DAILY 100 each 12  . buPROPion (WELLBUTRIN XL) 300 MG 24 hr tablet  Take 1 tablet (300 mg total) by mouth daily. 30 tablet 0  . busPIRone (BUSPAR) 15 MG tablet Take 15 mg by mouth 2 (two) times daily.     . clonazePAM (KLONOPIN) 1 MG tablet Take 1 mg by mouth daily.    . diclofenac sodium (VOLTAREN) 1 % GEL Apply 4 g topically 4 (four) times daily. 100 g 1  . feeding supplement, ENSURE ENLIVE, (ENSURE ENLIVE) LIQD Take 237 mLs by mouth 2 (two) times daily between meals. 237 mL 12  . fluticasone (FLONASE) 50 MCG/ACT nasal spray Place 1 spray into both nostrils daily. SHAKE LIQUID AND USE 2 SPRAYS IN EACH NOSTRIL DAILY 16 g 2  . gabapentin (NEURONTIN) 300 MG capsule Take 1 capsule (300 mg total) by mouth 2 (two) times daily. 60 capsule 0  . glucose blood (ACCU-CHEK GUIDE) test strip CHECK SUGAR FOUR TIMES DAILY 100 each 12  . insulin aspart (NOVOLOG FLEXPEN) 100 UNIT/ML FlexPen 0 to 12 units subcutaneously 3 times daily as per sliding scale (Patient taking differently: Inject 0-12 Units into the skin 2 (two) times daily. per sliding scale) 15 mL 11  . Insulin Detemir (LEVEMIR FLEXTOUCH) 100 UNIT/ML Pen Inject twice daily subcutaneously 38units in the morning and 38 units in the evening (Patient taking differently: Inject 39 Units into the skin 2 (two) times daily. ) 15 mL 6  . lidocaine (LIDODERM) 5 % Place 1 patch onto the skin daily. Remove & Discard patch within 12 hours or as directed by MD 30 patch 0  . linagliptin (TRADJENTA) 5 MG TABS tablet Take 1 tablet (5 mg total) by mouth daily. 30 tablet 6  . lisinopril-hydrochlorothiazide (ZESTORETIC) 10-12.5 MG tablet Take 1 tablet by mouth daily. 30 tablet 6  . methocarbamol (ROBAXIN) 500 MG tablet Take 2 tablets (1,000 mg total) by mouth 4 (four) times daily. (Patient taking differently: Take 1,000 mg by mouth 2 (two) times daily. ) 120 tablet 6  . omeprazole (PRILOSEC) 40 MG capsule TAKE 1 CAPSULE(40 MG) BY MOUTH DAILY 30 capsule 2  . QUEtiapine (SEROQUEL) 400 MG tablet Take 1 tablet (400 mg total) by mouth at bedtime.  30 tablet 11  . tamsulosin (FLOMAX) 0.4 MG CAPS capsule Take 0.4 mg by mouth at bedtime.    . Testosterone (ANDROGEL) 20.25 MG/1.25GM (1.62%) GEL Apply 20.25 mg topically every morning. Apply topically under arm pit alternating with each application. 1.25 g 3  . tiZANidine (ZANAFLEX) 4 MG capsule Take 1 capsule (4 mg total) by mouth 3 (three) times daily. (Patient taking differently: Take 4 mg by mouth at bedtime as needed for muscle spasms. ) 20 capsule 0  . traMADol (ULTRAM) 50 MG tablet Take 1 tablet (50 mg total) by mouth at bedtime. (Patient taking differently: Take 50 mg by mouth at bedtime as needed (pain). ) 30 tablet 2  . traZODone (  DESYREL) 150 MG tablet Take 1 tablet (150 mg total) by mouth at bedtime. 30 tablet 1  . divalproex (DEPAKOTE) 500 MG DR tablet Take 3 tablets (1,500 mg total) by mouth at bedtime. 90 tablet 0  . tadalafil (CIALIS) 5 MG tablet TAKE ONE TABLET BY MOUTH EVERY DAY FOR BPH (Patient not taking: Reported on 05/06/2019) 30 tablet 2   No current facility-administered medications on file prior to visit.    Observations/Objective: Awake, alert, oriented x3 Not in acute distress   CMP Latest Ref Rng & Units 05/18/2019 05/17/2019 05/16/2019  Glucose 70 - 99 mg/dL 109(H) 146(H) 118(H)  BUN 6 - 20 mg/dL 29(H) 32(H) 32(H)  Creatinine 0.61 - 1.24 mg/dL 1.91(H) 2.08(H) 2.30(H)  Sodium 135 - 145 mmol/L 139 139 138  Potassium 3.5 - 5.1 mmol/L 4.2 4.8 4.5  Chloride 98 - 111 mmol/L 105 105 100  CO2 22 - 32 mmol/L 27 28 28   Calcium 8.9 - 10.3 mg/dL 9.3 8.9 9.1  Total Protein 6.5 - 8.1 g/dL - 5.5(L) -  Total Bilirubin 0.3 - 1.2 mg/dL - 0.5 -  Alkaline Phos 38 - 126 U/L - 85 -  AST 15 - 41 U/L - 24 -  ALT 0 - 44 U/L - 16 -    Lab Results  Component Value Date   HGBA1C 8.3 (H) 05/14/2019    Assessment and Plan: 1. Depression with anxiety Stable Continue Buspar, Abilify He was placed on Klonopin at rehab and I have advised him I will be unable to keep him on this and  that he might have to reach out to his Psychiatrist. He states his Psychiatrist would not be prescribing it. Provided instructions on tapering off Klonopin due to risk of dependence and other side effects He will commence 0.5mg  daily for the next week then every other day for the week after  2. Type 2 diabetes mellitus with stage 3a chronic kidney disease, with long-term current use of insulin ([place]) Uncontrolled with A1c of 8.3 Continue current regimen A1c due at next in person visit  3. Benign prostatic hyperplasia with urinary retention Improved Continue Flomax, Cialis  4. Degenerative disc disease, lumbar Uncontrolled Ongoing falls Continue PT He was previously on Tramadol    Follow Up Instructions: Return in about 2 weeks (around 08/25/2019) for Diabetes - in person.    I discussed the assessment and treatment plan with the patient. The patient was provided an opportunity to ask questions and all were answered. The patient agreed with the plan and demonstrated an understanding of the instructions.   The patient was advised to call back or seek an in-person evaluation if the symptoms worsen or if the condition fails to improve as anticipated.     I provided 22 minutes total of non-face-to-face time during this encounter including median intraservice time, reviewing previous notes, investigations, ordering medications, medical decision making, coordinating care and patient verbalized understanding at the end of the visit.     [name], MD, FAAFP. [facility] and [facility], [place]   08/11/2019, 9:34 AM

## 2019-08-11 NOTE — Progress Notes (Signed)
Recently discharged from rehab.  Would like to discuss freestyle meter  Patient having lower back and neck pain

## 2019-08-12 ENCOUNTER — Telehealth: Payer: Self-pay

## 2019-08-12 ENCOUNTER — Encounter: Payer: Self-pay | Admitting: Family Medicine

## 2019-08-12 DIAGNOSIS — N183 Chronic kidney disease, stage 3 unspecified: Secondary | ICD-10-CM | POA: Diagnosis not present

## 2019-08-12 DIAGNOSIS — Z4801 Encounter for change or removal of surgical wound dressing: Secondary | ICD-10-CM | POA: Diagnosis not present

## 2019-08-12 DIAGNOSIS — Z48817 Encounter for surgical aftercare following surgery on the skin and subcutaneous tissue: Secondary | ICD-10-CM | POA: Diagnosis not present

## 2019-08-12 DIAGNOSIS — F0390 Unspecified dementia without behavioral disturbance: Secondary | ICD-10-CM | POA: Diagnosis not present

## 2019-08-12 DIAGNOSIS — I129 Hypertensive chronic kidney disease with stage 1 through stage 4 chronic kidney disease, or unspecified chronic kidney disease: Secondary | ICD-10-CM | POA: Diagnosis not present

## 2019-08-12 DIAGNOSIS — G8929 Other chronic pain: Secondary | ICD-10-CM | POA: Diagnosis not present

## 2019-08-12 DIAGNOSIS — L723 Sebaceous cyst: Secondary | ICD-10-CM | POA: Diagnosis not present

## 2019-08-12 DIAGNOSIS — E1140 Type 2 diabetes mellitus with diabetic neuropathy, unspecified: Secondary | ICD-10-CM | POA: Diagnosis not present

## 2019-08-12 DIAGNOSIS — E1122 Type 2 diabetes mellitus with diabetic chronic kidney disease: Secondary | ICD-10-CM | POA: Diagnosis not present

## 2019-08-12 NOTE — Telephone Encounter (Signed)
Patient is requesting a freestyle Libra meter to test his blood sugars.  [facility] on [address]

## 2019-08-15 DIAGNOSIS — Z4801 Encounter for change or removal of surgical wound dressing: Secondary | ICD-10-CM | POA: Diagnosis not present

## 2019-08-15 DIAGNOSIS — N183 Chronic kidney disease, stage 3 unspecified: Secondary | ICD-10-CM | POA: Diagnosis not present

## 2019-08-15 DIAGNOSIS — L723 Sebaceous cyst: Secondary | ICD-10-CM | POA: Diagnosis not present

## 2019-08-15 DIAGNOSIS — Z48817 Encounter for surgical aftercare following surgery on the skin and subcutaneous tissue: Secondary | ICD-10-CM | POA: Diagnosis not present

## 2019-08-15 DIAGNOSIS — E1140 Type 2 diabetes mellitus with diabetic neuropathy, unspecified: Secondary | ICD-10-CM | POA: Diagnosis not present

## 2019-08-15 DIAGNOSIS — G8929 Other chronic pain: Secondary | ICD-10-CM | POA: Diagnosis not present

## 2019-08-15 DIAGNOSIS — I129 Hypertensive chronic kidney disease with stage 1 through stage 4 chronic kidney disease, or unspecified chronic kidney disease: Secondary | ICD-10-CM | POA: Diagnosis not present

## 2019-08-15 DIAGNOSIS — E1122 Type 2 diabetes mellitus with diabetic chronic kidney disease: Secondary | ICD-10-CM | POA: Diagnosis not present

## 2019-08-15 DIAGNOSIS — F0390 Unspecified dementia without behavioral disturbance: Secondary | ICD-10-CM | POA: Diagnosis not present

## 2019-08-15 MED ORDER — FREESTYLE LIBRE 14 DAY SENSOR MISC: 4 refills | Status: DC

## 2019-08-15 MED ORDER — FREESTYLE LIBRE 14 DAY READER DEVI: 4 refills | Status: DC

## 2019-08-15 NOTE — Telephone Encounter (Signed)
Done

## 2019-08-16 DIAGNOSIS — F4310 Post-traumatic stress disorder, unspecified: Secondary | ICD-10-CM | POA: Diagnosis not present

## 2019-08-17 DIAGNOSIS — L723 Sebaceous cyst: Secondary | ICD-10-CM | POA: Diagnosis not present

## 2019-08-17 DIAGNOSIS — Z48817 Encounter for surgical aftercare following surgery on the skin and subcutaneous tissue: Secondary | ICD-10-CM | POA: Diagnosis not present

## 2019-08-17 DIAGNOSIS — Z4801 Encounter for change or removal of surgical wound dressing: Secondary | ICD-10-CM | POA: Diagnosis not present

## 2019-08-17 DIAGNOSIS — I129 Hypertensive chronic kidney disease with stage 1 through stage 4 chronic kidney disease, or unspecified chronic kidney disease: Secondary | ICD-10-CM | POA: Diagnosis not present

## 2019-08-17 DIAGNOSIS — E1122 Type 2 diabetes mellitus with diabetic chronic kidney disease: Secondary | ICD-10-CM | POA: Diagnosis not present

## 2019-08-17 DIAGNOSIS — N183 Chronic kidney disease, stage 3 unspecified: Secondary | ICD-10-CM | POA: Diagnosis not present

## 2019-08-17 DIAGNOSIS — E1140 Type 2 diabetes mellitus with diabetic neuropathy, unspecified: Secondary | ICD-10-CM | POA: Diagnosis not present

## 2019-08-17 DIAGNOSIS — G8929 Other chronic pain: Secondary | ICD-10-CM | POA: Diagnosis not present

## 2019-08-17 DIAGNOSIS — F0390 Unspecified dementia without behavioral disturbance: Secondary | ICD-10-CM | POA: Diagnosis not present

## 2019-08-19 DIAGNOSIS — F0390 Unspecified dementia without behavioral disturbance: Secondary | ICD-10-CM | POA: Diagnosis not present

## 2019-08-19 DIAGNOSIS — E1140 Type 2 diabetes mellitus with diabetic neuropathy, unspecified: Secondary | ICD-10-CM | POA: Diagnosis not present

## 2019-08-19 DIAGNOSIS — E1122 Type 2 diabetes mellitus with diabetic chronic kidney disease: Secondary | ICD-10-CM | POA: Diagnosis not present

## 2019-08-19 DIAGNOSIS — L723 Sebaceous cyst: Secondary | ICD-10-CM | POA: Diagnosis not present

## 2019-08-19 DIAGNOSIS — Z48817 Encounter for surgical aftercare following surgery on the skin and subcutaneous tissue: Secondary | ICD-10-CM | POA: Diagnosis not present

## 2019-08-19 DIAGNOSIS — I129 Hypertensive chronic kidney disease with stage 1 through stage 4 chronic kidney disease, or unspecified chronic kidney disease: Secondary | ICD-10-CM | POA: Diagnosis not present

## 2019-08-19 DIAGNOSIS — G8929 Other chronic pain: Secondary | ICD-10-CM | POA: Diagnosis not present

## 2019-08-19 DIAGNOSIS — N183 Chronic kidney disease, stage 3 unspecified: Secondary | ICD-10-CM | POA: Diagnosis not present

## 2019-08-19 DIAGNOSIS — Z4801 Encounter for change or removal of surgical wound dressing: Secondary | ICD-10-CM | POA: Diagnosis not present

## 2019-08-22 DIAGNOSIS — F0390 Unspecified dementia without behavioral disturbance: Secondary | ICD-10-CM | POA: Diagnosis not present

## 2019-08-22 DIAGNOSIS — Z4801 Encounter for change or removal of surgical wound dressing: Secondary | ICD-10-CM | POA: Diagnosis not present

## 2019-08-22 DIAGNOSIS — Z48817 Encounter for surgical aftercare following surgery on the skin and subcutaneous tissue: Secondary | ICD-10-CM | POA: Diagnosis not present

## 2019-08-22 DIAGNOSIS — E1122 Type 2 diabetes mellitus with diabetic chronic kidney disease: Secondary | ICD-10-CM | POA: Diagnosis not present

## 2019-08-22 DIAGNOSIS — L723 Sebaceous cyst: Secondary | ICD-10-CM | POA: Diagnosis not present

## 2019-08-22 DIAGNOSIS — G8929 Other chronic pain: Secondary | ICD-10-CM | POA: Diagnosis not present

## 2019-08-22 DIAGNOSIS — N183 Chronic kidney disease, stage 3 unspecified: Secondary | ICD-10-CM | POA: Diagnosis not present

## 2019-08-22 DIAGNOSIS — I129 Hypertensive chronic kidney disease with stage 1 through stage 4 chronic kidney disease, or unspecified chronic kidney disease: Secondary | ICD-10-CM | POA: Diagnosis not present

## 2019-08-22 DIAGNOSIS — E1140 Type 2 diabetes mellitus with diabetic neuropathy, unspecified: Secondary | ICD-10-CM | POA: Diagnosis not present

## 2019-08-24 DIAGNOSIS — I129 Hypertensive chronic kidney disease with stage 1 through stage 4 chronic kidney disease, or unspecified chronic kidney disease: Secondary | ICD-10-CM | POA: Diagnosis not present

## 2019-08-24 DIAGNOSIS — Z4801 Encounter for change or removal of surgical wound dressing: Secondary | ICD-10-CM | POA: Diagnosis not present

## 2019-08-24 DIAGNOSIS — E1122 Type 2 diabetes mellitus with diabetic chronic kidney disease: Secondary | ICD-10-CM | POA: Diagnosis not present

## 2019-08-24 DIAGNOSIS — G9349 Other encephalopathy: Secondary | ICD-10-CM | POA: Diagnosis not present

## 2019-08-24 DIAGNOSIS — N183 Chronic kidney disease, stage 3 unspecified: Secondary | ICD-10-CM | POA: Diagnosis not present

## 2019-08-24 DIAGNOSIS — E1140 Type 2 diabetes mellitus with diabetic neuropathy, unspecified: Secondary | ICD-10-CM | POA: Diagnosis not present

## 2019-08-24 DIAGNOSIS — L723 Sebaceous cyst: Secondary | ICD-10-CM | POA: Diagnosis not present

## 2019-08-24 DIAGNOSIS — Z48817 Encounter for surgical aftercare following surgery on the skin and subcutaneous tissue: Secondary | ICD-10-CM | POA: Diagnosis not present

## 2019-08-24 DIAGNOSIS — M6281 Muscle weakness (generalized): Secondary | ICD-10-CM | POA: Diagnosis not present

## 2019-08-24 DIAGNOSIS — F0390 Unspecified dementia without behavioral disturbance: Secondary | ICD-10-CM | POA: Diagnosis not present

## 2019-08-24 DIAGNOSIS — G8929 Other chronic pain: Secondary | ICD-10-CM | POA: Diagnosis not present

## 2019-08-25 ENCOUNTER — Encounter: Payer: Self-pay | Admitting: Family Medicine

## 2019-08-25 ENCOUNTER — Ambulatory Visit: Payer: Medicare HMO | Attending: Family Medicine | Admitting: Family Medicine

## 2019-08-25 ENCOUNTER — Other Ambulatory Visit: Payer: Self-pay

## 2019-08-25 VITALS — BP 136/78 | HR 80 | Ht 77.0 in | Wt 207.0 lb

## 2019-08-25 DIAGNOSIS — N1831 Chronic kidney disease, stage 3a: Secondary | ICD-10-CM

## 2019-08-25 DIAGNOSIS — I10 Essential (primary) hypertension: Secondary | ICD-10-CM | POA: Diagnosis not present

## 2019-08-25 DIAGNOSIS — E1121 Type 2 diabetes mellitus with diabetic nephropathy: Secondary | ICD-10-CM | POA: Diagnosis not present

## 2019-08-25 DIAGNOSIS — Z48817 Encounter for surgical aftercare following surgery on the skin and subcutaneous tissue: Secondary | ICD-10-CM | POA: Diagnosis not present

## 2019-08-25 DIAGNOSIS — L723 Sebaceous cyst: Secondary | ICD-10-CM | POA: Diagnosis not present

## 2019-08-25 DIAGNOSIS — R251 Tremor, unspecified: Secondary | ICD-10-CM

## 2019-08-25 DIAGNOSIS — E1122 Type 2 diabetes mellitus with diabetic chronic kidney disease: Secondary | ICD-10-CM | POA: Diagnosis not present

## 2019-08-25 DIAGNOSIS — Z1211 Encounter for screening for malignant neoplasm of colon: Secondary | ICD-10-CM | POA: Diagnosis not present

## 2019-08-25 DIAGNOSIS — K219 Gastro-esophageal reflux disease without esophagitis: Secondary | ICD-10-CM

## 2019-08-25 DIAGNOSIS — Z794 Long term (current) use of insulin: Secondary | ICD-10-CM | POA: Diagnosis not present

## 2019-08-25 DIAGNOSIS — M5136 Other intervertebral disc degeneration, lumbar region: Secondary | ICD-10-CM | POA: Diagnosis not present

## 2019-08-25 DIAGNOSIS — I129 Hypertensive chronic kidney disease with stage 1 through stage 4 chronic kidney disease, or unspecified chronic kidney disease: Secondary | ICD-10-CM | POA: Diagnosis not present

## 2019-08-25 DIAGNOSIS — Z4801 Encounter for change or removal of surgical wound dressing: Secondary | ICD-10-CM | POA: Diagnosis not present

## 2019-08-25 DIAGNOSIS — E1140 Type 2 diabetes mellitus with diabetic neuropathy, unspecified: Secondary | ICD-10-CM | POA: Diagnosis not present

## 2019-08-25 DIAGNOSIS — F0390 Unspecified dementia without behavioral disturbance: Secondary | ICD-10-CM | POA: Diagnosis not present

## 2019-08-25 DIAGNOSIS — N183 Chronic kidney disease, stage 3 unspecified: Secondary | ICD-10-CM | POA: Diagnosis not present

## 2019-08-25 DIAGNOSIS — G8929 Other chronic pain: Secondary | ICD-10-CM | POA: Diagnosis not present

## 2019-08-25 LAB — POCT GLYCOSYLATED HEMOGLOBIN (HGB A1C): HbA1c, POC (controlled diabetic range): 6.8 % (ref 0.0–7.0)

## 2019-08-25 LAB — GLUCOSE, POCT (MANUAL RESULT ENTRY): POC Glucose: 161 mg/dl — AB (ref 70–99)

## 2019-08-25 MED ORDER — LISINOPRIL-HYDROCHLOROTHIAZIDE 10-12.5 MG PO TABS: 1.0000 | ORAL_TABLET | Freq: Every day | ORAL | 1 refills | Status: DC

## 2019-08-25 MED ORDER — ATORVASTATIN CALCIUM 10 MG PO TABS: 10.0000 mg | ORAL_TABLET | Freq: Every day | ORAL | 1 refills | Status: DC

## 2019-08-25 MED ORDER — TRAMADOL HCL 50 MG PO TABS: 50.0000 mg | ORAL_TABLET | Freq: Every evening | ORAL | 1 refills | Status: DC | PRN

## 2019-08-25 MED ORDER — METHOCARBAMOL 500 MG PO TABS: 1000.0000 mg | ORAL_TABLET | Freq: Two times a day (BID) | ORAL | 3 refills | Status: DC

## 2019-08-25 MED ORDER — GABAPENTIN 300 MG PO CAPS: 300.0000 mg | ORAL_CAPSULE | Freq: Two times a day (BID) | ORAL | 1 refills | Status: DC

## 2019-08-25 MED ORDER — OMEPRAZOLE 40 MG PO CPDR: DELAYED_RELEASE_CAPSULE | ORAL | 1 refills | Status: DC

## 2019-08-25 MED ORDER — TRAZODONE HCL 150 MG PO TABS: 150.0000 mg | ORAL_TABLET | Freq: Every day | ORAL | 1 refills | Status: DC

## 2019-08-25 MED ORDER — LINAGLIPTIN 5 MG PO TABS: 5.0000 mg | ORAL_TABLET | Freq: Every day | ORAL | 1 refills | Status: DC

## 2019-08-25 NOTE — Patient Instructions (Signed)
Diabetes Mellitus and Foot Care Foot care is an important part of your health, especially when you have diabetes. Diabetes may cause you to have problems because of poor blood flow (circulation) to your feet and legs, which can cause your skin to:  Become thinner and drier.  Break more easily.  Heal more slowly.  Peel and crack. You may also have nerve damage (neuropathy) in your legs and feet, causing decreased feeling in them. This means that you may not notice minor injuries to your feet that could lead to more serious problems. Noticing and addressing any potential problems early is the best way to prevent future foot problems. How to care for your feet Foot hygiene  Wash your feet daily with warm water and mild soap. Do not use hot water. Then, pat your feet and the areas between your toes until they are completely dry. Do not soak your feet as this can dry your skin.  Trim your toenails straight across. Do not dig under them or around the cuticle. File the edges of your nails with an emery board or nail file.  Apply a moisturizing lotion or petroleum jelly to the skin on your feet and to dry, brittle toenails. Use lotion that does not contain alcohol and is unscented. Do not apply lotion between your toes. Shoes and socks  Wear clean socks or stockings every day. Make sure they are not too tight. Do not wear knee-high stockings since they may decrease blood flow to your legs.  Wear shoes that fit properly and have enough cushioning. Always look in your shoes before you put them on to be sure there are no objects inside.  To break in new shoes, wear them for just a few hours a day. This prevents injuries on your feet. Wounds, scrapes, corns, and calluses  Check your feet daily for blisters, cuts, bruises, sores, and redness. If you cannot see the bottom of your feet, use a mirror or ask someone for help.  Do not cut corns or calluses or try to remove them with medicine.  If you  find a minor scrape, cut, or break in the skin on your feet, keep it and the skin around it clean and dry. You may clean these areas with mild soap and water. Do not clean the area with peroxide, alcohol, or iodine.  If you have a wound, scrape, corn, or callus on your foot, look at it several times a day to make sure it is healing and not infected. Check for: ? Redness, swelling, or pain. ? Fluid or blood. ? Warmth. ? Pus or a bad smell. General instructions  Do not cross your legs. This may decrease blood flow to your feet.  Do not use heating pads or hot water bottles on your feet. They may burn your skin. If you have lost feeling in your feet or legs, you may not know this is happening until it is too late.  Protect your feet from hot and cold by wearing shoes, such as at the beach or on hot pavement.  Schedule a complete foot exam at least once a year (annually) or more often if you have foot problems. If you have foot problems, report any cuts, sores, or bruises to your health care provider immediately. Contact a health care provider if:  You have a medical condition that increases your risk of infection and you have any cuts, sores, or bruises on your feet.  You have an injury that is not   healing.  You have redness on your legs or feet.  You feel burning or tingling in your legs or feet.  You have pain or cramps in your legs and feet.  Your legs or feet are numb.  Your feet always feel cold.  You have pain around a toenail. Get help right away if:  You have a wound, scrape, corn, or callus on your foot and: ? You have pain, swelling, or redness that gets worse. ? You have fluid or blood coming from the wound, scrape, corn, or callus. ? Your wound, scrape, corn, or callus feels warm to the touch. ? You have pus or a bad smell coming from the wound, scrape, corn, or callus. ? You have a fever. ? You have a red line going up your leg. Summary  Check your feet every day  for cuts, sores, red spots, swelling, and blisters.  Moisturize feet and legs daily.  Wear shoes that fit properly and have enough cushioning.  If you have foot problems, report any cuts, sores, or bruises to your health care provider immediately.  Schedule a complete foot exam at least once a year (annually) or more often if you have foot problems. This information is not intended to replace advice given to you by your health care provider. Make sure you discuss any questions you have with your health care provider. Document Revised: 12/29/2018 Document Reviewed: 05/09/2016 [employer] Patient Education  2020 [employer]  

## 2019-08-25 NOTE — Progress Notes (Signed)
Subjective:  Patient ID: [name], male    DOB: Nov 20, 1965  Age: 54 y.o. 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 He had an office visit 2 weeks ago where he had complained about falling.  He is currently undergoing home PT and he is more stable.  A1c is 6.8 which has improved from 8.2 previously.  I had written him a prescription for continuous glucose monitoring which was denied by his insurance company.  He is compliant with his medications and on questioning about hypoglycemia he informed me that he had an episode overnight. He also has difficulty poking his fingers due to his tremor. Bipolar disorder by psych. We had discussed tapering instructions for Klonopin which he was prescribed when he was at rehab but he informs me today he states his Sister did not include it in his pilled box but he denies symptoms of withdrawal. He has no additional concerns today.  Past Medical History:  Diagnosis Date  . ADD (attention deficit disorder)   . Anxiety   . Arthritis    right hip  . Bipolar 1 disorder ([place])   . Blood in urine   . CKD (chronic kidney disease), stage III   . Creatinine elevation   . Dementia ([place])    "early onset" (08/04/2017)  . Depression    bipolar [facility]  . Diabetes mellitus without complication ([place])   . Family history of anesthesia complication    pt is unsure , but pt father may have been difficult to arouse   . HCAP (healthcare-associated pneumonia) 10/31/2012  . History of kidney stones   . Hypertension   . Hypogonadism male   . Liver fatty degeneration   . Microscopic hematuria    hereditary s/p Urology eval  . Osteoarthritis of right hip 11/28/2011   2012 2015 s/p THR Severe  Dr Novella Olive    . Pleural  effusion 11/02/2012  . Pneumonia 10-2012  . Pneumonia, organism unspecified(486) 11/02/2012  . Polysubstance dependence, non-opioid, in remission ([place])    remote  . Primary osteoarthritis of left hip 05/22/2015  . PTSD (post-traumatic stress disorder)    SOCIAL ANXIETY DISORDER   . Suicide attempt by multiple drug overdose ([place]) 12-31-15   Grieving his cat's death 2015-06-20    Past Surgical History:  Procedure Laterality Date  . BACK SURGERY    . CLOSED REDUCTION METACARPAL WITH PERCUTANEOUS PINNING Right   . LUMBAR DISC SURGERY    . TONSILLECTOMY    . TOTAL HIP ARTHROPLASTY Right 08/16/2013   Procedure: TOTAL HIP ARTHROPLASTY ANTERIOR APPROACH;  Surgeon: Hessie Dibble, MD;  Location: [facility];  Service: Orthopedics;  Laterality: Right;  . TOTAL HIP ARTHROPLASTY Left 05/22/2015   Procedure: TOTAL HIP ARTHROPLASTY ANTERIOR APPROACH;  Surgeon: Melrose Nakayama, MD;  Location: [facility];  Service: Orthopedics;  Laterality: Left;    Family History  Problem Relation Age of Onset  . Diabetes Father   . Cancer Mother        died of melanoma with mets  . Cervical cancer Sister   . Diabetes Sister   . Other Neg Hx        hypogonadism    Allergies  Allergen Reactions  . Vicodin [Hydrocodone-Acetaminophen] Itching    Outpatient Medications Prior to  Visit  Medication Sig Dispense Refill  . acetaminophen (TYLENOL) 325 MG tablet Take 2 tablets (650 mg total) by mouth every 6 (six) hours as needed for mild pain (or Fever >/= 101).    . ARIPiprazole (ABILIFY) 5 MG tablet TAKE 1 TABLET BY MOUTH DAILY FOR DEPRESSION (Patient taking differently: Take 5 mg by mouth daily. ) 30 tablet 0  . atorvastatin (LIPITOR) 10 MG tablet TAKE 1 TABLET(10 MG) BY MOUTH DAILY (Patient taking differently: Take 10 mg by mouth at bedtime. ) 90 tablet 0  . B-D UF III MINI PEN NEEDLES 31G X 5 MM MISC USE FOUR TIMES DAILY 100 each 12  . buPROPion (WELLBUTRIN XL) 300 MG 24 hr tablet Take 1 tablet (300 mg total) by mouth daily. 30  tablet 0  . busPIRone (BUSPAR) 15 MG tablet Take 15 mg by mouth 2 (two) times daily.     . clonazePAM (KLONOPIN) 1 MG tablet Take 1 mg by mouth daily.    . Continuous Blood Gluc Receiver (FREESTYLE LIBRE 14 DAY READER) DEVI Use as directed 1 each 4  . Continuous Blood Gluc Sensor (FREESTYLE LIBRE 14 DAY SENSOR) MISC Use as directed 1 each 4  . diclofenac sodium (VOLTAREN) 1 % GEL Apply 4 g topically 4 (four) times daily. 100 g 1  . feeding supplement, ENSURE ENLIVE, (ENSURE ENLIVE) LIQD Take 237 mLs by mouth 2 (two) times daily between meals. 237 mL 12  . fluticasone (FLONASE) 50 MCG/ACT nasal spray Place 1 spray into both nostrils daily. SHAKE LIQUID AND USE 2 SPRAYS IN EACH NOSTRIL DAILY 16 g 2  . gabapentin (NEURONTIN) 300 MG capsule Take 1 capsule (300 mg total) by mouth 2 (two) times daily. 60 capsule 0  . glucose blood (ACCU-CHEK GUIDE) test strip CHECK SUGAR FOUR TIMES DAILY 100 each 12  . insulin aspart (NOVOLOG FLEXPEN) 100 UNIT/ML FlexPen 0 to 12 units subcutaneously 3 times daily as per sliding scale (Patient taking differently: Inject 0-12 Units into the skin 2 (two) times daily. per sliding scale) 15 mL 11  . Insulin Detemir (LEVEMIR FLEXTOUCH) 100 UNIT/ML Pen Inject twice daily subcutaneously 38units in the morning and 38 units in the evening (Patient taking differently: Inject 39 Units into the skin 2 (two) times daily. ) 15 mL 6  . lidocaine (LIDODERM) 5 % Place 1 patch onto the skin daily. Remove & Discard patch within 12 hours or as directed by MD 30 patch 0  . linagliptin (TRADJENTA) 5 MG TABS tablet Take 1 tablet (5 mg total) by mouth daily. 30 tablet 6  . lisinopril-hydrochlorothiazide (ZESTORETIC) 10-12.5 MG tablet Take 1 tablet by mouth daily. 30 tablet 6  . methocarbamol (ROBAXIN) 500 MG tablet Take 2 tablets (1,000 mg total) by mouth 4 (four) times daily. (Patient taking differently: Take 1,000 mg by mouth 2 (two) times daily. ) 120 tablet 6  . omeprazole (PRILOSEC) 40 MG  capsule TAKE 1 CAPSULE(40 MG) BY MOUTH DAILY 30 capsule 2  . QUEtiapine (SEROQUEL) 400 MG tablet Take 1 tablet (400 mg total) by mouth at bedtime. 30 tablet 11  . tamsulosin (FLOMAX) 0.4 MG CAPS capsule Take 0.4 mg by mouth at bedtime.    . Testosterone (ANDROGEL) 20.25 MG/1.25GM (1.62%) GEL Apply 20.25 mg topically every morning. Apply topically under arm pit alternating with each application. 1.25 g 3  . tiZANidine (ZANAFLEX) 4 MG capsule Take 1 capsule (4 mg total) by mouth 3 (three) times daily. (Patient taking differently: Take 4 mg by mouth  at bedtime as needed for muscle spasms. ) 20 capsule 0  . traMADol (ULTRAM) 50 MG tablet Take 1 tablet (50 mg total) by mouth at bedtime. (Patient taking differently: Take 50 mg by mouth at bedtime as needed (pain). ) 30 tablet 2  . traZODone (DESYREL) 150 MG tablet Take 1 tablet (150 mg total) by mouth at bedtime. 30 tablet 1  . divalproex (DEPAKOTE) 500 MG DR tablet Take 3 tablets (1,500 mg total) by mouth at bedtime. 90 tablet 0  . tadalafil (CIALIS) 5 MG tablet TAKE ONE TABLET BY MOUTH EVERY DAY FOR BPH (Patient not taking: Reported on 05/06/2019) 30 tablet 2   No facility-administered medications prior to visit.     ROS Review of Systems  Constitutional: Negative for activity change and appetite change.  HENT: Negative for sinus pressure and sore throat.   Eyes: Negative for visual disturbance.  Respiratory: Negative for cough, chest tightness and shortness of breath.   Cardiovascular: Negative for chest pain and leg swelling.  Gastrointestinal: Negative for abdominal distention, abdominal pain, constipation and diarrhea.  Endocrine: Negative.   Genitourinary: Negative for dysuria.  Musculoskeletal: Positive for back pain. Negative for joint swelling and myalgias.  Skin: Negative for rash.  Allergic/Immunologic: Negative.   Neurological: Negative for weakness, light-headedness and numbness.  Psychiatric/Behavioral: Negative for dysphoric mood  and suicidal ideas.    Objective:  BP 136/78   Pulse 80   Ht 6' 5"  (1.956 m)   Wt 207 lb (93.9 kg)   SpO2 97%   BMI 24.55 kg/m   BP/Weight 08/25/2019 05/20/2019 7/62/2633  Systolic BP 354 562 -  Diastolic BP 78 71 -  Wt. (Lbs) 207 - 197.09  BMI 24.55 - 23.37      Physical Exam Constitutional:      Appearance: He is well-developed.  Neck:     Vascular: No JVD.  Cardiovascular:     Rate and Rhythm: Normal rate.     Heart sounds: Normal heart sounds. No murmur.  Pulmonary:     Effort: Pulmonary effort is normal.     Breath sounds: Normal breath sounds. No wheezing or rales.  Chest:     Chest wall: No tenderness.  Abdominal:     General: Bowel sounds are normal. There is no distension.     Palpations: Abdomen is soft. There is no mass.     Tenderness: There is no abdominal tenderness.  Musculoskeletal:        General: Tenderness (lumbar spine and paraspinal lumbar muscles) present. Normal range of motion.     Right lower leg: No edema.     Left lower leg: No edema.  Neurological:     Mental Status: He is alert and oriented to person, place, and time.  Psychiatric:        Mood and Affect: Mood normal.     CMP Latest Ref Rng & Units 05/18/2019 05/17/2019 05/16/2019  Glucose 70 - 99 mg/dL 109(H) 146(H) 118(H)  BUN 6 - 20 mg/dL 29(H) 32(H) 32(H)  Creatinine 0.61 - 1.24 mg/dL 1.91(H) 2.08(H) 2.30(H)  Sodium 135 - 145 mmol/L 139 139 138  Potassium 3.5 - 5.1 mmol/L 4.2 4.8 4.5  Chloride 98 - 111 mmol/L 105 105 100  CO2 22 - 32 mmol/L 27 28 28   Calcium 8.9 - 10.3 mg/dL 9.3 8.9 9.1  Total Protein 6.5 - 8.1 g/dL - 5.5(L) -  Total Bilirubin 0.3 - 1.2 mg/dL - 0.5 -  Alkaline Phos 38 - 126 U/L - 85 -  AST 15 - 41 U/L - 24 -  ALT 0 - 44 U/L - 16 -    Lipid Panel     Component Value Date/Time   CHOL 141 02/10/2017 0953   TRIG 259 (H) 02/10/2017 0953   HDL 34 (L) 02/10/2017 0953   CHOLHDL 4.1 02/10/2017 0953   CHOLHDL 7 06/02/2016 0844   VLDL 41 (H) 10/03/2015 0434    LDLCALC 55 02/10/2017 0953   LDLDIRECT 72.0 06/02/2016 0844    CBC    Component Value Date/Time   WBC 3.5 (L) 05/17/2019 0532   RBC 3.73 (L) 05/17/2019 0532   HGB 11.4 (L) 05/17/2019 0532   HGB 16.5 05/12/2018 1647   HCT 33.1 (L) 05/17/2019 0532   HCT 47.9 05/12/2018 1647   PLT 57 (L) 05/17/2019 0532   PLT 123 (L) 05/12/2018 1647   MCV 88.7 05/17/2019 0532   MCV 95 05/12/2018 1647   MCH 30.6 05/17/2019 0532   MCHC 34.4 05/17/2019 0532   RDW 12.0 05/17/2019 0532   RDW 15.4 05/12/2018 1647   LYMPHSABS 1.9 05/17/2019 0532   LYMPHSABS 2.3 05/12/2018 1647   MONOABS 0.4 05/17/2019 0532   EOSABS 0.0 05/17/2019 0532   EOSABS 0.0 05/12/2018 1647   BASOSABS 0.0 05/17/2019 0532   BASOSABS 0.0 05/12/2018 1647    Lab Results  Component Value Date   HGBA1C 6.8 08/25/2019    Assessment & Plan:    1. Type 2 diabetes mellitus with stage 3a chronic kidney disease, with long-term current use of insulin (HCC) Controlled with A1c of 6.8; goal is less than 7 He has mentioned with a few episodes of hypoglycemia-advised to decrease Levemir dose by 2 units to prevent early morning hypoglycemia Will have the pharmacist try an appeal or prior authorization process for continuous glucose monitoring program - POCT glucose (manual entry) - POCT glycosylated hemoglobin (Hb A1C) - CMP14+EGFR - Microalbumin / creatinine urine ratio - Ambulatory referral to Ophthalmology - linagliptin (TRADJENTA) 5 MG TABS tablet; Take 1 tablet (5 mg total) by mouth daily.  Dispense: 90 tablet; Refill: 1 - atorvastatin (LIPITOR) 10 MG tablet; Take 1 tablet (10 mg total) by mouth at bedtime.  Dispense: 90 tablet; Refill: 1 - Lipid panel  2. Degenerative disc disease, lumbar With recurrent falls Continue physical therapy - traMADol (ULTRAM) 50 MG tablet; Take 1 tablet (50 mg total) by mouth at bedtime as needed (pain).  Dispense: 30 tablet; Refill: 1 - methocarbamol (ROBAXIN) 500 MG tablet; Take 2 tablets (1,000  mg total) by mouth 2 (two) times daily.  Dispense: 120 tablet; Refill: 3  3. Gastroesophageal reflux disease without esophagitis Controlled - omeprazole (PRILOSEC) 40 MG capsule; TAKE 1 CAPSULE(40 MG) BY MOUTH DAILY  Dispense: 90 capsule; Refill: 1  4. Essential hypertension Controlled Counseled on blood pressure goal of less than 130/80, low-sodium, DASH diet, medication compliance, 150 minutes of moderate intensity exercise per week. Discussed medication compliance, adverse effects. - lisinopril-hydrochlorothiazide (ZESTORETIC) 10-12.5 MG tablet; Take 1 tablet by mouth daily.  Dispense: 90 tablet; Refill: 1  5. Screening for colon cancer - Ambulatory referral to Gastroenterology  6. Tremor Unknown etiology This makes it difficult to perform blood glucose checks    [name], MD, FAAFP. [facility], [place]   08/25/2019, 10:36 AM

## 2019-08-26 ENCOUNTER — Other Ambulatory Visit: Payer: Self-pay | Admitting: Family Medicine

## 2019-08-26 DIAGNOSIS — L723 Sebaceous cyst: Secondary | ICD-10-CM | POA: Diagnosis not present

## 2019-08-26 DIAGNOSIS — G8929 Other chronic pain: Secondary | ICD-10-CM | POA: Diagnosis not present

## 2019-08-26 DIAGNOSIS — E1122 Type 2 diabetes mellitus with diabetic chronic kidney disease: Secondary | ICD-10-CM | POA: Diagnosis not present

## 2019-08-26 DIAGNOSIS — N1831 Chronic kidney disease, stage 3a: Secondary | ICD-10-CM

## 2019-08-26 DIAGNOSIS — E1140 Type 2 diabetes mellitus with diabetic neuropathy, unspecified: Secondary | ICD-10-CM | POA: Diagnosis not present

## 2019-08-26 DIAGNOSIS — Z4801 Encounter for change or removal of surgical wound dressing: Secondary | ICD-10-CM | POA: Diagnosis not present

## 2019-08-26 DIAGNOSIS — F0390 Unspecified dementia without behavioral disturbance: Secondary | ICD-10-CM | POA: Diagnosis not present

## 2019-08-26 DIAGNOSIS — Z48817 Encounter for surgical aftercare following surgery on the skin and subcutaneous tissue: Secondary | ICD-10-CM | POA: Diagnosis not present

## 2019-08-26 DIAGNOSIS — I129 Hypertensive chronic kidney disease with stage 1 through stage 4 chronic kidney disease, or unspecified chronic kidney disease: Secondary | ICD-10-CM | POA: Diagnosis not present

## 2019-08-26 DIAGNOSIS — N183 Chronic kidney disease, stage 3 unspecified: Secondary | ICD-10-CM | POA: Diagnosis not present

## 2019-08-26 LAB — CMP14+EGFR
ALT: 9 IU/L (ref 0–44)
AST: 21 IU/L (ref 0–40)
Albumin/Globulin Ratio: 1.6 (ref 1.2–2.2)
Albumin: 4.8 g/dL (ref 3.8–4.9)
Alkaline Phosphatase: 105 IU/L (ref 39–117)
BUN/Creatinine Ratio: 17 (ref 9–20)
BUN: 33 mg/dL — ABNORMAL HIGH (ref 6–24)
Bilirubin Total: 0.4 mg/dL (ref 0.0–1.2)
CO2: 26 mmol/L (ref 20–29)
Calcium: 9.9 mg/dL (ref 8.7–10.2)
Chloride: 101 mmol/L (ref 96–106)
Creatinine, Ser: 2 mg/dL — ABNORMAL HIGH (ref 0.76–1.27)
GFR calc Af Amer: 43 mL/min/{1.73_m2} — ABNORMAL LOW (ref >59)
GFR calc non Af Amer: 38 mL/min/{1.73_m2} — ABNORMAL LOW (ref >59)
Globulin, Total: 3 g/dL (ref 1.5–4.5)
Glucose: 144 mg/dL — ABNORMAL HIGH (ref 65–99)
Potassium: 4.6 mmol/L (ref 3.5–5.2)
Sodium: 140 mmol/L (ref 134–144)
Total Protein: 7.8 g/dL (ref 6.0–8.5)

## 2019-08-26 LAB — LIPID PANEL
Chol/HDL Ratio: 3.6 ratio (ref 0.0–5.0)
Cholesterol, Total: 135 mg/dL (ref 100–199)
HDL: 37 mg/dL — ABNORMAL LOW (ref >39)
LDL Chol Calc (NIH): 58 mg/dL (ref 0–99)
Triglycerides: 249 mg/dL — ABNORMAL HIGH (ref 0–149)
VLDL Cholesterol Cal: 40 mg/dL (ref 5–40)

## 2019-08-26 LAB — MICROALBUMIN / CREATININE URINE RATIO
Creatinine, Urine: 217.7 mg/dL
Microalb/Creat Ratio: 47 mg/g creat — ABNORMAL HIGH (ref 0–29)
Microalbumin, Urine: 102.4 ug/mL

## 2019-08-29 DIAGNOSIS — E1140 Type 2 diabetes mellitus with diabetic neuropathy, unspecified: Secondary | ICD-10-CM | POA: Diagnosis not present

## 2019-08-29 DIAGNOSIS — Z4801 Encounter for change or removal of surgical wound dressing: Secondary | ICD-10-CM | POA: Diagnosis not present

## 2019-08-29 DIAGNOSIS — I129 Hypertensive chronic kidney disease with stage 1 through stage 4 chronic kidney disease, or unspecified chronic kidney disease: Secondary | ICD-10-CM | POA: Diagnosis not present

## 2019-08-29 DIAGNOSIS — E1122 Type 2 diabetes mellitus with diabetic chronic kidney disease: Secondary | ICD-10-CM | POA: Diagnosis not present

## 2019-08-29 DIAGNOSIS — G8929 Other chronic pain: Secondary | ICD-10-CM | POA: Diagnosis not present

## 2019-08-29 DIAGNOSIS — N183 Chronic kidney disease, stage 3 unspecified: Secondary | ICD-10-CM | POA: Diagnosis not present

## 2019-08-29 DIAGNOSIS — F0390 Unspecified dementia without behavioral disturbance: Secondary | ICD-10-CM | POA: Diagnosis not present

## 2019-08-29 DIAGNOSIS — L723 Sebaceous cyst: Secondary | ICD-10-CM | POA: Diagnosis not present

## 2019-08-29 DIAGNOSIS — Z48817 Encounter for surgical aftercare following surgery on the skin and subcutaneous tissue: Secondary | ICD-10-CM | POA: Diagnosis not present

## 2019-08-30 ENCOUNTER — Telehealth: Payer: Self-pay | Admitting: Family Medicine

## 2019-08-30 ENCOUNTER — Encounter: Payer: Self-pay | Admitting: Gastroenterology

## 2019-08-30 DIAGNOSIS — G8929 Other chronic pain: Secondary | ICD-10-CM | POA: Diagnosis not present

## 2019-08-30 DIAGNOSIS — E1140 Type 2 diabetes mellitus with diabetic neuropathy, unspecified: Secondary | ICD-10-CM | POA: Diagnosis not present

## 2019-08-30 DIAGNOSIS — M5136 Other intervertebral disc degeneration, lumbar region: Secondary | ICD-10-CM

## 2019-08-30 DIAGNOSIS — I129 Hypertensive chronic kidney disease with stage 1 through stage 4 chronic kidney disease, or unspecified chronic kidney disease: Secondary | ICD-10-CM | POA: Diagnosis not present

## 2019-08-30 DIAGNOSIS — Z4801 Encounter for change or removal of surgical wound dressing: Secondary | ICD-10-CM | POA: Diagnosis not present

## 2019-08-30 DIAGNOSIS — L723 Sebaceous cyst: Secondary | ICD-10-CM | POA: Diagnosis not present

## 2019-08-30 DIAGNOSIS — Z48817 Encounter for surgical aftercare following surgery on the skin and subcutaneous tissue: Secondary | ICD-10-CM | POA: Diagnosis not present

## 2019-08-30 DIAGNOSIS — N183 Chronic kidney disease, stage 3 unspecified: Secondary | ICD-10-CM | POA: Diagnosis not present

## 2019-08-30 DIAGNOSIS — E1122 Type 2 diabetes mellitus with diabetic chronic kidney disease: Secondary | ICD-10-CM | POA: Diagnosis not present

## 2019-08-30 DIAGNOSIS — F0390 Unspecified dementia without behavioral disturbance: Secondary | ICD-10-CM | POA: Diagnosis not present

## 2019-08-30 NOTE — Telephone Encounter (Signed)
Patient called saying that his back has been hurting more than usual and would like a referral to a neurosurgeon. Please f/u

## 2019-08-30 NOTE — Telephone Encounter (Signed)
Will route to PCP for review. 

## 2019-08-31 NOTE — Telephone Encounter (Signed)
Referral has been placed. 

## 2019-09-01 DIAGNOSIS — Z48817 Encounter for surgical aftercare following surgery on the skin and subcutaneous tissue: Secondary | ICD-10-CM | POA: Diagnosis not present

## 2019-09-01 DIAGNOSIS — N183 Chronic kidney disease, stage 3 unspecified: Secondary | ICD-10-CM | POA: Diagnosis not present

## 2019-09-01 DIAGNOSIS — E1140 Type 2 diabetes mellitus with diabetic neuropathy, unspecified: Secondary | ICD-10-CM | POA: Diagnosis not present

## 2019-09-01 DIAGNOSIS — L723 Sebaceous cyst: Secondary | ICD-10-CM | POA: Diagnosis not present

## 2019-09-01 DIAGNOSIS — E1122 Type 2 diabetes mellitus with diabetic chronic kidney disease: Secondary | ICD-10-CM | POA: Diagnosis not present

## 2019-09-01 DIAGNOSIS — Z4801 Encounter for change or removal of surgical wound dressing: Secondary | ICD-10-CM | POA: Diagnosis not present

## 2019-09-01 DIAGNOSIS — G8929 Other chronic pain: Secondary | ICD-10-CM | POA: Diagnosis not present

## 2019-09-01 DIAGNOSIS — F0390 Unspecified dementia without behavioral disturbance: Secondary | ICD-10-CM | POA: Diagnosis not present

## 2019-09-01 DIAGNOSIS — I129 Hypertensive chronic kidney disease with stage 1 through stage 4 chronic kidney disease, or unspecified chronic kidney disease: Secondary | ICD-10-CM | POA: Diagnosis not present

## 2019-09-06 DIAGNOSIS — F4310 Post-traumatic stress disorder, unspecified: Secondary | ICD-10-CM | POA: Diagnosis not present

## 2019-09-07 DIAGNOSIS — F0390 Unspecified dementia without behavioral disturbance: Secondary | ICD-10-CM | POA: Diagnosis not present

## 2019-09-07 DIAGNOSIS — L723 Sebaceous cyst: Secondary | ICD-10-CM | POA: Diagnosis not present

## 2019-09-07 DIAGNOSIS — Z48817 Encounter for surgical aftercare following surgery on the skin and subcutaneous tissue: Secondary | ICD-10-CM | POA: Diagnosis not present

## 2019-09-07 DIAGNOSIS — E1140 Type 2 diabetes mellitus with diabetic neuropathy, unspecified: Secondary | ICD-10-CM | POA: Diagnosis not present

## 2019-09-07 DIAGNOSIS — G8929 Other chronic pain: Secondary | ICD-10-CM | POA: Diagnosis not present

## 2019-09-07 DIAGNOSIS — I129 Hypertensive chronic kidney disease with stage 1 through stage 4 chronic kidney disease, or unspecified chronic kidney disease: Secondary | ICD-10-CM | POA: Diagnosis not present

## 2019-09-07 DIAGNOSIS — Z4801 Encounter for change or removal of surgical wound dressing: Secondary | ICD-10-CM | POA: Diagnosis not present

## 2019-09-07 DIAGNOSIS — N183 Chronic kidney disease, stage 3 unspecified: Secondary | ICD-10-CM | POA: Diagnosis not present

## 2019-09-07 DIAGNOSIS — E1122 Type 2 diabetes mellitus with diabetic chronic kidney disease: Secondary | ICD-10-CM | POA: Diagnosis not present

## 2019-09-08 ENCOUNTER — Telehealth: Payer: Self-pay | Admitting: Family Medicine

## 2019-09-08 DIAGNOSIS — Z48817 Encounter for surgical aftercare following surgery on the skin and subcutaneous tissue: Secondary | ICD-10-CM | POA: Diagnosis not present

## 2019-09-08 DIAGNOSIS — G8929 Other chronic pain: Secondary | ICD-10-CM | POA: Diagnosis not present

## 2019-09-08 DIAGNOSIS — F0390 Unspecified dementia without behavioral disturbance: Secondary | ICD-10-CM | POA: Diagnosis not present

## 2019-09-08 DIAGNOSIS — Z4801 Encounter for change or removal of surgical wound dressing: Secondary | ICD-10-CM | POA: Diagnosis not present

## 2019-09-08 DIAGNOSIS — L723 Sebaceous cyst: Secondary | ICD-10-CM | POA: Diagnosis not present

## 2019-09-08 DIAGNOSIS — I129 Hypertensive chronic kidney disease with stage 1 through stage 4 chronic kidney disease, or unspecified chronic kidney disease: Secondary | ICD-10-CM | POA: Diagnosis not present

## 2019-09-08 DIAGNOSIS — E1140 Type 2 diabetes mellitus with diabetic neuropathy, unspecified: Secondary | ICD-10-CM | POA: Diagnosis not present

## 2019-09-08 DIAGNOSIS — N183 Chronic kidney disease, stage 3 unspecified: Secondary | ICD-10-CM | POA: Diagnosis not present

## 2019-09-08 DIAGNOSIS — E1122 Type 2 diabetes mellitus with diabetic chronic kidney disease: Secondary | ICD-10-CM | POA: Diagnosis not present

## 2019-09-08 NOTE — Telephone Encounter (Signed)
Rep from [facility] called in and requested for an antiinflammatory for the patient. She stated that he has been experiencing osteoarthritis pain and it has been causing him to be unsteady. Please follow up at your earliest convenience.

## 2019-09-08 NOTE — Telephone Encounter (Signed)
Will route to PCP  [facility] [address]

## 2019-09-09 NOTE — Telephone Encounter (Signed)
I had prescribed him Voltaren gel which is a topical anti-inflammatory as he is unable to use an oral NSAID due to his chronic kidney disease.

## 2019-09-09 NOTE — Telephone Encounter (Signed)
Patient was called and informed to get OTC voltaren gel for pain.

## 2019-09-10 DIAGNOSIS — N183 Chronic kidney disease, stage 3 unspecified: Secondary | ICD-10-CM | POA: Diagnosis not present

## 2019-09-10 DIAGNOSIS — Z4801 Encounter for change or removal of surgical wound dressing: Secondary | ICD-10-CM | POA: Diagnosis not present

## 2019-09-10 DIAGNOSIS — E1140 Type 2 diabetes mellitus with diabetic neuropathy, unspecified: Secondary | ICD-10-CM | POA: Diagnosis not present

## 2019-09-10 DIAGNOSIS — Z48817 Encounter for surgical aftercare following surgery on the skin and subcutaneous tissue: Secondary | ICD-10-CM | POA: Diagnosis not present

## 2019-09-10 DIAGNOSIS — I129 Hypertensive chronic kidney disease with stage 1 through stage 4 chronic kidney disease, or unspecified chronic kidney disease: Secondary | ICD-10-CM | POA: Diagnosis not present

## 2019-09-10 DIAGNOSIS — F0390 Unspecified dementia without behavioral disturbance: Secondary | ICD-10-CM | POA: Diagnosis not present

## 2019-09-10 DIAGNOSIS — G8929 Other chronic pain: Secondary | ICD-10-CM | POA: Diagnosis not present

## 2019-09-10 DIAGNOSIS — E1122 Type 2 diabetes mellitus with diabetic chronic kidney disease: Secondary | ICD-10-CM | POA: Diagnosis not present

## 2019-09-10 DIAGNOSIS — L723 Sebaceous cyst: Secondary | ICD-10-CM | POA: Diagnosis not present

## 2019-09-13 DIAGNOSIS — L723 Sebaceous cyst: Secondary | ICD-10-CM | POA: Diagnosis not present

## 2019-09-13 DIAGNOSIS — Z4801 Encounter for change or removal of surgical wound dressing: Secondary | ICD-10-CM | POA: Diagnosis not present

## 2019-09-13 DIAGNOSIS — Z48817 Encounter for surgical aftercare following surgery on the skin and subcutaneous tissue: Secondary | ICD-10-CM | POA: Diagnosis not present

## 2019-09-13 DIAGNOSIS — I129 Hypertensive chronic kidney disease with stage 1 through stage 4 chronic kidney disease, or unspecified chronic kidney disease: Secondary | ICD-10-CM | POA: Diagnosis not present

## 2019-09-13 DIAGNOSIS — F0390 Unspecified dementia without behavioral disturbance: Secondary | ICD-10-CM | POA: Diagnosis not present

## 2019-09-13 DIAGNOSIS — G8929 Other chronic pain: Secondary | ICD-10-CM | POA: Diagnosis not present

## 2019-09-13 DIAGNOSIS — N183 Chronic kidney disease, stage 3 unspecified: Secondary | ICD-10-CM | POA: Diagnosis not present

## 2019-09-13 DIAGNOSIS — E1140 Type 2 diabetes mellitus with diabetic neuropathy, unspecified: Secondary | ICD-10-CM | POA: Diagnosis not present

## 2019-09-13 DIAGNOSIS — E1122 Type 2 diabetes mellitus with diabetic chronic kidney disease: Secondary | ICD-10-CM | POA: Diagnosis not present

## 2019-09-14 ENCOUNTER — Encounter: Payer: Self-pay | Admitting: Diagnostic Neuroimaging

## 2019-09-14 ENCOUNTER — Other Ambulatory Visit: Payer: Self-pay

## 2019-09-14 ENCOUNTER — Ambulatory Visit (INDEPENDENT_AMBULATORY_CARE_PROVIDER_SITE_OTHER): Payer: Medicare HMO | Admitting: Diagnostic Neuroimaging

## 2019-09-14 VITALS — BP 163/106 | HR 86 | Ht 77.0 in | Wt 210.8 lb

## 2019-09-14 DIAGNOSIS — F4310 Post-traumatic stress disorder, unspecified: Secondary | ICD-10-CM

## 2019-09-14 DIAGNOSIS — R413 Other amnesia: Secondary | ICD-10-CM | POA: Diagnosis not present

## 2019-09-14 DIAGNOSIS — F250 Schizoaffective disorder, bipolar type: Secondary | ICD-10-CM

## 2019-09-14 NOTE — Progress Notes (Signed)
[facility]  PATIENT: [name] DOB: 06/06/1965  REFERRING CLINICIAN: [name], MD HISTORY FROM: patient  REASON FOR VISIT: new consult    HISTORICAL  CHIEF COMPLAINT:  Chief Complaint  Patient presents with  . Disorientation    rm 6 New Pt, sister- [name] "periods of disorientation, confusion, frequent falls; diagnosed with early-onset dementia in hospital Nov 2019"  MMSE 22    HISTORY OF PRESENT ILLNESS:   54 year old male with PTSD and bipolar disorder here for evaluation memory loss.  Patient previously had diagnosis of "early onset dementia" but in 2017 and 2018 this was not felt to be accurate.  Memory and confusion spells were thought to be related to psychiatry issues and metabolic derangements.  Patient is currently living alone.  He has support from his sister who is also his power of attorney.  He has had multiple emergency room visits and hospitalizations over the past few years.  These have been related to acute encephalopathy, hyperglycemia, falls, balance difficulties.   REVIEW OF SYSTEMS: Full 14 system review of systems performed and negative with exception of: As per HPI.  ALLERGIES: Allergies  Allergen Reactions  . Vicodin [Hydrocodone-Acetaminophen] Itching    HOME MEDICATIONS: Outpatient Medications Prior to Visit  Medication Sig Dispense Refill  . acetaminophen (TYLENOL) 325 MG tablet Take 2 tablets (650 mg total) by mouth every 6 (six) hours as needed for mild pain (or Fever >/= 101).    . ARIPiprazole (ABILIFY) 5 MG tablet TAKE 1 TABLET BY MOUTH DAILY FOR DEPRESSION (Patient taking differently: Take 5 mg by mouth daily. ) 30 tablet 0  . atorvastatin (LIPITOR) 10 MG tablet Take 1 tablet (10 mg total) by mouth at bedtime. 90 tablet 1  . B-D UF III MINI PEN NEEDLES 31G X 5 MM MISC USE FOUR TIMES DAILY 100 each 12  . buPROPion (WELLBUTRIN XL) 300 MG 24 hr tablet Take 1 tablet (300 mg total) by mouth daily. 30 tablet 0  .  busPIRone (BUSPAR) 15 MG tablet Take 15 mg by mouth 2 (two) times daily.     . Continuous Blood Gluc Receiver (FREESTYLE LIBRE 14 DAY READER) DEVI Use as directed 1 each 4  . Continuous Blood Gluc Sensor (FREESTYLE LIBRE 14 DAY SENSOR) MISC Use as directed 1 each 4  . diclofenac sodium (VOLTAREN) 1 % GEL Apply 4 g topically 4 (four) times daily. 100 g 1  . divalproex (DEPAKOTE) 500 MG DR tablet Take 3 tablets (1,500 mg total) by mouth at bedtime. 90 tablet 0  . feeding supplement, ENSURE ENLIVE, (ENSURE ENLIVE) LIQD Take 237 mLs by mouth 2 (two) times daily between meals. 237 mL 12  . fluticasone (FLONASE) 50 MCG/ACT nasal spray Place 1 spray into both nostrils daily. SHAKE LIQUID AND USE 2 SPRAYS IN EACH NOSTRIL DAILY 16 g 2  . gabapentin (NEURONTIN) 300 MG capsule Take 1 capsule (300 mg total) by mouth 2 (two) times daily. 180 capsule 1  . glucose blood (ACCU-CHEK GUIDE) test strip CHECK SUGAR FOUR TIMES DAILY 100 each 12  . insulin aspart (NOVOLOG FLEXPEN) 100 UNIT/ML FlexPen 0 to 12 units subcutaneously 3 times daily as per sliding scale (Patient taking differently: Inject 0-12 Units into the skin 2 (two) times daily. per sliding scale) 15 mL 11  . Insulin Detemir (LEVEMIR FLEXTOUCH) 100 UNIT/ML Pen Inject twice daily subcutaneously 38units in the morning and 38 units in the evening (Patient taking differently: Inject 39 Units into the skin 2 (two) times  daily. ) 15 mL 6  . lidocaine (LIDODERM) 5 % Place 1 patch onto the skin daily. Remove & Discard patch within 12 hours or as directed by MD 30 patch 0  . linagliptin (TRADJENTA) 5 MG TABS tablet Take 1 tablet (5 mg total) by mouth daily. 90 tablet 1  . methocarbamol (ROBAXIN) 500 MG tablet Take 2 tablets (1,000 mg total) by mouth 2 (two) times daily. 120 tablet 3  . omeprazole (PRILOSEC) 40 MG capsule TAKE 1 CAPSULE(40 MG) BY MOUTH DAILY 90 capsule 1  . QUEtiapine (SEROQUEL) 400 MG tablet Take 1 tablet (400 mg total) by mouth at bedtime. 30 tablet  11  . tadalafil (CIALIS) 5 MG tablet TAKE ONE TABLET BY MOUTH EVERY DAY FOR BPH 30 tablet 2  . Testosterone (ANDROGEL) 20.25 MG/1.25GM (1.62%) GEL Apply 20.25 mg topically every morning. Apply topically under arm pit alternating with each application. 1.25 g 3  . traMADol (ULTRAM) 50 MG tablet Take 1 tablet (50 mg total) by mouth at bedtime as needed (pain). 30 tablet 1  . traZODone (DESYREL) 150 MG tablet Take 1 tablet (150 mg total) by mouth at bedtime. 90 tablet 1  . clonazePAM (KLONOPIN) 1 MG tablet Take 1 mg by mouth daily.    Marland Kitchen lisinopril-hydrochlorothiazide (ZESTORETIC) 10-12.5 MG tablet Take 1 tablet by mouth daily. (Patient not taking: Reported on 09/14/2019) 90 tablet 1  . tamsulosin (FLOMAX) 0.4 MG CAPS capsule Take 0.4 mg by mouth at bedtime.     No facility-administered medications prior to visit.    PAST MEDICAL HISTORY: Past Medical History:  Diagnosis Date  . ADD (attention deficit disorder)   . Anxiety   . Arthritis    right hip  . Bipolar 1 disorder ([place])   . Blood in urine   . CKD (chronic kidney disease), stage III   . Creatinine elevation   . Dementia ([place])    "early onset" (08/04/2017)  . Depression    bipolar [facility]  . Diabetes mellitus without complication ([place])   . Family history of anesthesia complication    pt is unsure , but pt father may have been difficult to arouse   . HCAP (healthcare-associated pneumonia) 10/31/2012  . History of kidney stones   . Hypertension   . Hypogonadism male   . Liver fatty degeneration   . Microscopic hematuria    hereditary s/p Urology eval  . Osteoarthritis of right hip 11/28/2011   2012 2015 s/p THR Severe  Dr Novella Olive    . Pleural effusion 11/02/2012  . Pneumonia 10-2012  . Pneumonia, organism unspecified(486) 11/02/2012  . Polysubstance dependence, non-opioid, in remission ([place])    remote  . Primary osteoarthritis of left hip 05/22/2015  . PTSD (post-traumatic stress disorder)    SOCIAL ANXIETY DISORDER   .  Suicide attempt by multiple drug overdose ([place]) 01/07/16   Grieving his cat's death 2015-07-24    PAST SURGICAL HISTORY: Past Surgical History:  Procedure Laterality Date  . BACK SURGERY    . CLOSED REDUCTION METACARPAL WITH PERCUTANEOUS PINNING Right   . LUMBAR DISC SURGERY    . TONSILLECTOMY    . TOTAL HIP ARTHROPLASTY Right 08/16/2013   Procedure: TOTAL HIP ARTHROPLASTY ANTERIOR APPROACH;  Surgeon: Hessie Dibble, MD;  Location: [facility];  Service: Orthopedics;  Laterality: Right;  . TOTAL HIP ARTHROPLASTY Left 05/22/2015   Procedure: TOTAL HIP ARTHROPLASTY ANTERIOR APPROACH;  Surgeon: Melrose Nakayama, MD;  Location: [facility];  Service: Orthopedics;  Laterality: Left;  FAMILY HISTORY: Family History  Problem Relation Age of Onset  . Diabetes Father   . Cancer Mother        died of melanoma with mets  . Cervical cancer Sister   . Diabetes Sister   . Other Neg Hx        hypogonadism    SOCIAL HISTORY: Social History   Socioeconomic History  . Marital status: Single    Spouse name: Not on file  . Number of children: Not on file  . Years of education: Not on file  . Highest education level: Some college, no degree  Occupational History  . Occupation: disability  Tobacco Use  . Smoking status: Former Smoker    Years: 0.25    Types: Cigarettes    Quit date: 03/22/2019    Years since quitting: 0.4  . Smokeless tobacco: Never Used  Substance and Sexual Activity  . Alcohol use: No  . Drug use: No    Comment: hx of marijuana/cocaine/crack use but sober since 20's  . Sexual activity: Not Currently  Other Topics Concern  . Not on file  Social History Narrative   09/14/19 lives alone, sister [name] helps with meds, he has some in home care, lived with sister until Nov 2020   Caffeine- sodas, amount  varies   regular exercise-no   Social Determinants of Health   Financial Resource Strain:   . Difficulty of Paying Living Expenses:   Food Insecurity:   . Worried About Ship broker in the Last Year:   . Arboriculturist in the Last Year:   Transportation Needs:   . Film/video editor (Medical):   Marland Kitchen Lack of Transportation (Non-Medical):   Physical Activity:   . Days of Exercise per Week:   . Minutes of Exercise per Session:   Stress:   . Feeling of Stress :   Social Connections:   . Frequency of Communication with Friends and Family:   . Frequency of Social Gatherings with Friends and Family:   . Attends Religious Services:   . Active Member of Clubs or Organizations:   . Attends Archivist Meetings:   Marland Kitchen Marital Status:   Intimate Partner Violence:   . Fear of Current or Ex-Partner:   . Emotionally Abused:   Marland Kitchen Physically Abused:   . Sexually Abused:      PHYSICAL EXAM  GENERAL EXAM/CONSTITUTIONAL: Vitals:  Vitals:   09/14/19 1109  BP: (!) 163/106  Pulse: 86  Weight: 210 lb 12.8 oz (95.6 kg)  Height: 6\' 5"  (1.956 m)     Body mass index is 25 kg/m. Wt Readings from Last 3 Encounters:  09/14/19 210 lb 12.8 oz (95.6 kg)  08/25/19 207 lb (93.9 kg)  05/16/19 197 lb 1.5 oz (89.4 kg)     Patient is in no distress; well developed, nourished and groomed; neck is supple  CARDIOVASCULAR:  Examination of carotid arteries is normal; no carotid bruits  Regular rate and rhythm, no murmurs  Examination of peripheral vascular system by observation and palpation is normal  EYES:  Ophthalmoscopic exam of optic discs and posterior segments is normal; no papilledema or hemorrhages  No exam data present  MUSCULOSKELETAL:  Gait, strength, tone, movements noted in Neurologic exam below  NEUROLOGIC: MENTAL STATUS:  MMSE - [place] Exam 09/14/2019  Orientation to time 5  Orientation to Place 4  Registration 3  Attention/ Calculation 0  Recall 2  Language- name 2 objects  2  Language- repeat 1  Language- follow 3 step command 3  Language- read & follow direction 1  Write a sentence 1  Copy design 0  Total score 22      awake, alert, oriented to person, place and time  recent and remote memory intact  normal attention and concentration  language fluent, comprehension intact, naming intact  fund of knowledge appropriate  CRANIAL NERVE:   2nd - no papilledema on fundoscopic exam  2nd, 3rd, 4th, 6th - pupils equal and reactive to light, visual fields full to confrontation, extraocular muscles intact, no nystagmus  5th - facial sensation symmetric  7th - facial strength symmetric  8th - hearing intact  9th - palate elevates symmetrically, uvula midline  11th - shoulder shrug symmetric  12th - tongue protrusion midline  MOTOR:   normal bulk and tone, full strength in the BUE, BLE  SENSORY:   normal and symmetric to light touch, temperature, vibration; DECR IN FEET  COORDINATION:   finger-nose-finger, fine finger movements normal  REFLEXES:   deep tendon reflexes TRACE and symmetric  GAIT/STATION:   narrow based gait; USING WALKER     DIAGNOSTIC DATA (LABS, IMAGING, TESTING) - I reviewed patient records, labs, notes, testing and imaging myself where available.  Lab Results  Component Value Date   WBC 3.5 (L) 05/17/2019   HGB 11.4 (L) 05/17/2019   HCT 33.1 (L) 05/17/2019   MCV 88.7 05/17/2019   PLT 57 (L) 05/17/2019      Component Value Date/Time   NA 140 08/25/2019 1134   K 4.6 08/25/2019 1134   CL 101 08/25/2019 1134   CO2 26 08/25/2019 1134   GLUCOSE 144 (H) 08/25/2019 1134   GLUCOSE 109 (H) 05/18/2019 0638   BUN 33 (H) 08/25/2019 1134   CREATININE 2.00 (H) 08/25/2019 1134   CALCIUM 9.9 08/25/2019 1134   PROT 7.8 08/25/2019 1134   ALBUMIN 4.8 08/25/2019 1134   AST 21 08/25/2019 1134   ALT 9 08/25/2019 1134   ALKPHOS 105 08/25/2019 1134   BILITOT 0.4 08/25/2019 1134   GFRNONAA 38 (L) 08/25/2019 1134   GFRAA 43 (L) 08/25/2019 1134   Lab Results  Component Value Date   CHOL 135 08/25/2019   HDL 37 (L) 08/25/2019   LDLCALC 58 08/25/2019   LDLDIRECT  72.0 06/02/2016   TRIG 249 (H) 08/25/2019   CHOLHDL 3.6 08/25/2019   Lab Results  Component Value Date   HGBA1C 6.8 08/25/2019   Lab Results  Component Value Date   FKCLEXNT70 017 08/04/2017   Lab Results  Component Value Date   TSH 2.108 08/04/2017    09/10/16  MRI HEAD IMPRESSION: 1. No acute intracranial process identified. 2. Moderately advanced cerebral atrophy for patient age. Otherwise unremarkable brain MRI.  09/10/16  MRI CERVICAL SPINE IMPRESSION: 1. Large right paracentral disc protrusion at C5-6 with resultant moderate canal stenosis. Flattening of the right hemi cord without cord signal changes. 2. Broad posterior disc protrusion at C6-7 with mild to moderate canal stenosis. 3. Multifactorial degenerative changes with moderate to advanced bilateral C5 and C6 foraminal stenosis.  09/10/16  MRI THORACIC SPINE IMPRESSION: 1. Normal MRI appearance of the thoracic spinal cord. No significant canal stenosis identified. 2. Mild degenerative disc bulging at T4-5, T5-6, and T8-9 without stenosis. 3. Left-sided posterior element hypertrophy at T10-11 with resultant mild to moderate left foraminal stenosis.  05/14/19 CT head [I reviewed images myself and agree with interpretation. -VRP]  1. No acute intracranial abnormality.  No intracranial mass, hemorrhage or edema. No skull fracture. 2. No fracture or acute subluxation within the cervical spine. 3. Degenerative change within the cervical spine, most prominent at C5-6 where there is a prominent disc-osteophytic bulge and superimposed ossification of the posterior longitudinal ligament, causing moderate central canal stenosis with possible mass effect on the anterior portion of the cervical cord.    ASSESSMENT AND PLAN  54 y.o. year old male here with:  Dx:  1. Memory loss   2. Schizoaffective disorder, bipolar type ([place])   3. PTSD (post-traumatic stress disorder)      PLAN:  MILD COGNITIVE  IMPAIRMENT (due to bipolar d/o, PTSD, h/o substance abuse) - supportive care - safety / supervision issues reviewed - no driving; caution with finances and medications  Return for pending if symptoms worsen or fail to improve, return to PCP.    Penni Bombard, MD 9/93/7169, 67:89 AM Certified in Neurology, Neurophysiology and Neuroimaging  [facility] [address] [place], [place] [address] [phone]

## 2019-09-14 NOTE — Patient Instructions (Signed)
MILD COGNITIVE IMPAIRMENT (due to bipolar d/o, PTSD, h/o substance abuse) - supportive care - safety / supervision issues reviewed - no driving; caution with finances and medications

## 2019-09-15 ENCOUNTER — Telehealth: Payer: Self-pay | Admitting: Family Medicine

## 2019-09-15 ENCOUNTER — Emergency Department (HOSPITAL_COMMUNITY): Payer: Medicare HMO

## 2019-09-15 ENCOUNTER — Emergency Department (HOSPITAL_COMMUNITY)
Admission: EM | Admit: 2019-09-15 | Discharge: 2019-09-15 | Disposition: A | Discharge status: Home / Self Care | Payer: Medicare HMO | Attending: Emergency Medicine | Admitting: Emergency Medicine

## 2019-09-15 DIAGNOSIS — G8929 Other chronic pain: Secondary | ICD-10-CM | POA: Diagnosis not present

## 2019-09-15 DIAGNOSIS — N183 Chronic kidney disease, stage 3 unspecified: Secondary | ICD-10-CM | POA: Diagnosis not present

## 2019-09-15 DIAGNOSIS — S92514A Nondisplaced fracture of proximal phalanx of right lesser toe(s), initial encounter for closed fracture: Secondary | ICD-10-CM

## 2019-09-15 DIAGNOSIS — S3992XA Unspecified injury of lower back, initial encounter: Secondary | ICD-10-CM | POA: Diagnosis not present

## 2019-09-15 DIAGNOSIS — Z87891 Personal history of nicotine dependence: Secondary | ICD-10-CM | POA: Insufficient documentation

## 2019-09-15 DIAGNOSIS — Z4801 Encounter for change or removal of surgical wound dressing: Secondary | ICD-10-CM | POA: Diagnosis not present

## 2019-09-15 DIAGNOSIS — Y999 Unspecified external cause status: Secondary | ICD-10-CM | POA: Insufficient documentation

## 2019-09-15 DIAGNOSIS — Y929 Unspecified place or not applicable: Secondary | ICD-10-CM | POA: Diagnosis not present

## 2019-09-15 DIAGNOSIS — Z794 Long term (current) use of insulin: Secondary | ICD-10-CM | POA: Insufficient documentation

## 2019-09-15 DIAGNOSIS — F0390 Unspecified dementia without behavioral disturbance: Secondary | ICD-10-CM | POA: Diagnosis not present

## 2019-09-15 DIAGNOSIS — Z48817 Encounter for surgical aftercare following surgery on the skin and subcutaneous tissue: Secondary | ICD-10-CM | POA: Diagnosis not present

## 2019-09-15 DIAGNOSIS — S92511A Displaced fracture of proximal phalanx of right lesser toe(s), initial encounter for closed fracture: Secondary | ICD-10-CM | POA: Diagnosis not present

## 2019-09-15 DIAGNOSIS — Y939 Activity, unspecified: Secondary | ICD-10-CM | POA: Diagnosis not present

## 2019-09-15 DIAGNOSIS — W19XXXA Unspecified fall, initial encounter: Secondary | ICD-10-CM | POA: Diagnosis not present

## 2019-09-15 DIAGNOSIS — R52 Pain, unspecified: Secondary | ICD-10-CM | POA: Diagnosis not present

## 2019-09-15 DIAGNOSIS — E1140 Type 2 diabetes mellitus with diabetic neuropathy, unspecified: Secondary | ICD-10-CM | POA: Diagnosis not present

## 2019-09-15 DIAGNOSIS — R519 Headache, unspecified: Secondary | ICD-10-CM | POA: Diagnosis not present

## 2019-09-15 DIAGNOSIS — E1122 Type 2 diabetes mellitus with diabetic chronic kidney disease: Secondary | ICD-10-CM | POA: Diagnosis not present

## 2019-09-15 DIAGNOSIS — I10 Essential (primary) hypertension: Secondary | ICD-10-CM | POA: Insufficient documentation

## 2019-09-15 DIAGNOSIS — S99921A Unspecified injury of right foot, initial encounter: Secondary | ICD-10-CM | POA: Diagnosis present

## 2019-09-15 DIAGNOSIS — L723 Sebaceous cyst: Secondary | ICD-10-CM | POA: Diagnosis not present

## 2019-09-15 DIAGNOSIS — I129 Hypertensive chronic kidney disease with stage 1 through stage 4 chronic kidney disease, or unspecified chronic kidney disease: Secondary | ICD-10-CM | POA: Diagnosis not present

## 2019-09-15 DIAGNOSIS — E1165 Type 2 diabetes mellitus with hyperglycemia: Secondary | ICD-10-CM | POA: Diagnosis not present

## 2019-09-15 DIAGNOSIS — M542 Cervicalgia: Secondary | ICD-10-CM | POA: Diagnosis not present

## 2019-09-15 DIAGNOSIS — E119 Type 2 diabetes mellitus without complications: Secondary | ICD-10-CM | POA: Insufficient documentation

## 2019-09-15 LAB — CBG MONITORING, ED: Glucose-Capillary: 314 mg/dL — ABNORMAL HIGH (ref 70–99)

## 2019-09-15 MED ORDER — ACETAMINOPHEN 325 MG PO TABS
650.0000 mg | ORAL_TABLET | Freq: Once | ORAL | Status: AC
  Administered 2019-09-15: 650 mg via ORAL
  Filled 2019-09-15: qty 2

## 2019-09-15 NOTE — Telephone Encounter (Signed)
Sondra from [place] at [facility] called saying that patients BP was 180/100 and that after 20-30 minutes it went down to 160/90. Patient does not want to go to the ED. Rep would like a call back today if possible. Please f/u

## 2019-09-15 NOTE — ED Notes (Signed)
Pt transported to CT ?

## 2019-09-15 NOTE — ED Provider Notes (Signed)
[facility] EMERGENCY DEPARTMENT Provider Note   CSN: [id number] Arrival date & time: 09/15/19  1242     History No chief complaint on file.   [name] is a 55 y.o. male.  54 year old male presents for evaluation after a fall.  Patient states that he fell around 9 PM last night, states that he is "a fall risk and falls all the time."  Patient had home health at his house today, when he told him about the fall he was sent to the ER for evaluation.  Patient states he has bruising and pain in his right foot along the first MTP, on further questioning states that he does have pain across his low back, states that he did hit his head on the wall with loss of consciousness for unknown amount of time and does have pain in his neck.  Patient has been ambulatory since the fall without difficulty.  Patient denies headache, nausea, vomiting, visual disturbance.  Patient is not anticoagulated. Patient states that he does not take a blood pressure pill, states he was previously prescribed something however another doctor told him to stop taking it.        Past Medical History:  Diagnosis Date  . ADD (attention deficit disorder)   . Anxiety   . Arthritis    right hip  . Bipolar 1 disorder ([place])   . Blood in urine   . CKD (chronic kidney disease), stage III   . Creatinine elevation   . Dementia ([place])    "early onset" (08/04/2017)  . Depression    bipolar [facility]  . Diabetes mellitus without complication ([place])   . Family history of anesthesia complication    pt is unsure , but pt father may have been difficult to arouse   . HCAP (healthcare-associated pneumonia) 10/31/2012  . History of kidney stones   . Hypertension   . Hypogonadism male   . Liver fatty degeneration   . Microscopic hematuria    hereditary s/p Urology eval  . Osteoarthritis of right hip 11/28/2011   2012 2015 s/p THR Severe  Dr Novella Olive    . Pleural effusion 11/02/2012  . Pneumonia 10-2012    . Pneumonia, organism unspecified(486) 11/02/2012  . Polysubstance dependence, non-opioid, in remission ([place])    remote  . Primary osteoarthritis of left hip 05/22/2015  . PTSD (post-traumatic stress disorder)    SOCIAL ANXIETY DISORDER   . Suicide attempt by multiple drug overdose ([place]) 02-Jan-2016   Grieving his cat's death 19-Jul-2015    Patient Active Problem List   Diagnosis Date Noted  . Recurrent falls 05/15/2019  . AKI (acute kidney injury) ([place]) 05/14/2019  . Closed displaced fracture of lateral malleolus of left fibula 08/02/2017  . BPH (benign prostatic hyperplasia) 07/24/2017  . Hemorrhoid 05/27/2017  . Shoulder arthritis 05/27/2017  . Diabetic neuropathy ([place]) 05/27/2017  . Degenerative disc disease, lumbar 02/04/2017  . Constipation   . History of seizures 09/20/2016  . Acute lower UTI   . Urinary retention   . HLD (hyperlipidemia) 09/07/2016  . GERD (gastroesophageal reflux disease) 09/07/2016  . PTSD (post-traumatic stress disorder) 09/07/2016  . Fall 09/07/2016  . Chronic kidney disease   . Ataxia 10/14/2015  . Hypokalemia 10/03/2015  . Hemiparesis ([place])   . Dyslipidemia 05/31/2014  . Insomnia 11/11/2013  . Degenerative joint disease (DJD) of hip 08/16/2013  . Acute midline low back pain without sciatica 05/20/2011  . Erectile dysfunction 02/17/2011  . Constipation - functional  02/17/2011  . Diabetes type 2, uncontrolled ([place]) 11/06/2010  . Hypogonadism male 05/20/2010  . TOBACCO USER 05/20/2010  . Demoralization and apathy 05/20/2010  . Schizoaffective disorder, bipolar type ([place]) 01/18/2010  . Right shoulder pain 01/18/2010  . Anxiety state 12/05/2006  . Essential hypertension 12/05/2006    Past Surgical History:  Procedure Laterality Date  . BACK SURGERY    . CLOSED REDUCTION METACARPAL WITH PERCUTANEOUS PINNING Right   . LUMBAR DISC SURGERY    . TONSILLECTOMY    . TOTAL HIP ARTHROPLASTY Right 08/16/2013   Procedure: TOTAL HIP ARTHROPLASTY ANTERIOR  APPROACH;  Surgeon: Hessie Dibble, MD;  Location: [facility];  Service: Orthopedics;  Laterality: Right;  . TOTAL HIP ARTHROPLASTY Left 05/22/2015   Procedure: TOTAL HIP ARTHROPLASTY ANTERIOR APPROACH;  Surgeon: Melrose Nakayama, MD;  Location: [facility];  Service: Orthopedics;  Laterality: Left;       Family History  Problem Relation Age of Onset  . Diabetes Father   . Cancer Mother        died of melanoma with mets  . Cervical cancer Sister   . Diabetes Sister   . Other Neg Hx        hypogonadism    Social History   Tobacco Use  . Smoking status: Former Smoker    Years: 0.25    Types: Cigarettes    Quit date: 03/22/2019    Years since quitting: 0.4  . Smokeless tobacco: Never Used  Substance Use Topics  . Alcohol use: No  . Drug use: No    Comment: hx of marijuana/cocaine/crack use but sober since 20's    Home Medications Prior to Admission medications   Medication Sig Start Date End Date Taking? Authorizing Provider  acetaminophen (TYLENOL) 325 MG tablet Take 2 tablets (650 mg total) by mouth every 6 (six) hours as needed for mild pain (or Fever >/= 101). 08/11/17   Elgergawy, Silver Huguenin, MD  ARIPiprazole (ABILIFY) 5 MG tablet TAKE 1 TABLET BY MOUTH DAILY FOR DEPRESSION Patient taking differently: Take 5 mg by mouth daily.  06/23/18   [name], MD  atorvastatin (LIPITOR) 10 MG tablet Take 1 tablet (10 mg total) by mouth at bedtime. 08/25/19   [name], MD  B-D UF III MINI PEN NEEDLES 31G X 5 MM MISC USE FOUR TIMES DAILY 04/05/19   [name], MD  buPROPion (WELLBUTRIN XL) 300 MG 24 hr tablet Take 1 tablet (300 mg total) by mouth daily. 10/13/16   Elgergawy, Silver Huguenin, MD  busPIRone (BUSPAR) 15 MG tablet Take 15 mg by mouth 2 (two) times daily.     [provider]  clonazePAM (KLONOPIN) 1 MG tablet Take 1 mg by mouth daily.    [provider]  Continuous Blood Gluc Receiver (FREESTYLE LIBRE 14 DAY READER) DEVI Use as directed 08/15/19   [name],  MD  Continuous Blood Gluc Sensor (FREESTYLE LIBRE 14 DAY SENSOR) MISC Use as directed 08/15/19   [name], MD  diclofenac sodium (VOLTAREN) 1 % GEL Apply 4 g topically 4 (four) times daily. 02/04/17   [name], MD  divalproex (DEPAKOTE) 500 MG DR tablet Take 3 tablets (1,500 mg total) by mouth at bedtime. 10/13/16 09/14/19  Elgergawy, Silver Huguenin, MD  feeding supplement, ENSURE ENLIVE, (ENSURE ENLIVE) LIQD Take 237 mLs by mouth 2 (two) times daily between meals. 08/11/17   Elgergawy, Silver Huguenin, MD  fluticasone (FLONASE) 50 MCG/ACT nasal spray Place 1 spray into both nostrils daily. SHAKE LIQUID AND  USE 2 SPRAYS IN EACH NOSTRIL DAILY 05/24/19   [name], MD  gabapentin (NEURONTIN) 300 MG capsule Take 1 capsule (300 mg total) by mouth 2 (two) times daily. 08/25/19   [name], MD  glucose blood (ACCU-CHEK GUIDE) test strip CHECK SUGAR FOUR TIMES DAILY 07/22/18   [name], MD  insulin aspart (NOVOLOG FLEXPEN) 100 UNIT/ML FlexPen 0 to 12 units subcutaneously 3 times daily as per sliding scale Patient taking differently: Inject 0-12 Units into the skin 2 (two) times daily. per sliding scale 07/23/18   [name], MD  Insulin Detemir (LEVEMIR FLEXTOUCH) 100 UNIT/ML Pen Inject twice daily subcutaneously 38units in the morning and 38 units in the evening Patient taking differently: Inject 39 Units into the skin 2 (two) times daily.  02/21/19   [name], MD  lidocaine (LIDODERM) 5 % Place 1 patch onto the skin daily. Remove & Discard patch within 12 hours or as directed by MD 05/12/18   [name], MD  linagliptin (TRADJENTA) 5 MG TABS tablet Take 1 tablet (5 mg total) by mouth daily. 08/25/19   [name], MD  lisinopril-hydrochlorothiazide (ZESTORETIC) 10-12.5 MG tablet Take 1 tablet by mouth daily. Patient not taking: Reported on 09/14/2019 08/25/19   [name], MD  methocarbamol (ROBAXIN) 500 MG tablet Take 2 tablets (1,000 mg total) by mouth 2 (two) times daily.  08/25/19   [name], MD  omeprazole (PRILOSEC) 40 MG capsule TAKE 1 CAPSULE(40 MG) BY MOUTH DAILY 08/25/19   [name], MD  QUEtiapine (SEROQUEL) 400 MG tablet Take 1 tablet (400 mg total) by mouth at bedtime. 10/13/16   Elgergawy, Silver Huguenin, MD  tadalafil (CIALIS) 5 MG tablet TAKE ONE TABLET BY MOUTH EVERY DAY FOR BPH 02/21/19   [name], MD  tamsulosin (FLOMAX) 0.4 MG CAPS capsule Take 0.4 mg by mouth at bedtime. 05/02/19   [provider]  Testosterone (ANDROGEL) 20.25 MG/1.25GM (1.62%) GEL Apply 20.25 mg topically every morning. Apply topically under arm pit alternating with each application. 02/21/19   [name], MD  traMADol (ULTRAM) 50 MG tablet Take 1 tablet (50 mg total) by mouth at bedtime as needed (pain). 08/25/19   [name], MD  traZODone (DESYREL) 150 MG tablet Take 1 tablet (150 mg total) by mouth at bedtime. 08/25/19   [name], MD    Allergies    Vicodin [hydrocodone-acetaminophen]  Review of Systems   Review of Systems  Constitutional: Negative for fever.  Eyes: Negative for visual disturbance.  Respiratory: Negative for shortness of breath.   Cardiovascular: Negative for chest pain.  Gastrointestinal: Negative for abdominal pain.  Musculoskeletal: Positive for arthralgias, back pain, joint swelling and neck pain. Negative for gait problem.  Skin: Positive for wound.  Allergic/Immunologic: Positive for immunocompromised state.  Neurological: Negative for weakness, numbness and headaches.  Psychiatric/Behavioral: Negative for confusion.  All other systems reviewed and are negative.   Physical Exam Updated Vital Signs BP (!) 164/98 (BP Location: Right Arm)   Pulse 87   Temp 98.7 F (37.1 C) (Oral)   Resp 17   SpO2 99%   Physical Exam Vitals and nursing note reviewed.  Constitutional:      General: He is not in acute distress.    Appearance: He is well-developed. He is not diaphoretic.  HENT:     Head: Normocephalic and  atraumatic.     Mouth/Throat:     Mouth: Mucous membranes are moist.  Eyes:     Extraocular Movements: Extraocular movements intact.  Pupils: Pupils are equal, round, and reactive to light.  Cardiovascular:     Pulses: Normal pulses.  Pulmonary:     Effort: Pulmonary effort is normal.  Musculoskeletal:        General: Swelling and tenderness present. No deformity.     Cervical back: Tenderness present.     Thoracic back: No tenderness or bony tenderness.     Lumbar back: Tenderness present. No bony tenderness.     Right lower leg: No edema.     Left lower leg: No edema.       Legs:     Comments: Low back pain without bony tenderness or step offs. Reports generalized posterior neck pain without crepitus or step offs.   Skin:    General: Skin is warm and dry.     Findings: No erythema or rash.  Neurological:     Mental Status: He is alert and oriented to person, place, and time.     GCS: GCS eye subscore is 4. GCS verbal subscore is 5. GCS motor subscore is 6.  Psychiatric:        Behavior: Behavior normal.     ED Results / Procedures / Treatments   Labs (all labs ordered are listed, but only abnormal results are displayed) Labs Reviewed  CBG MONITORING, ED - Abnormal; Notable for the following components:      Result Value   Glucose-Capillary 314 (*)    All other components within normal limits    EKG None  Radiology DG Lumbar Spine Complete  Result Date: 09/15/2019 CLINICAL DATA:  [place], pain EXAM: LUMBAR SPINE - COMPLETE 4+ VIEW COMPARISON:  04/23/2019 FINDINGS: Frontal, bilateral oblique, and lateral views of the lumbar spine are obtained. There are 5 non-rib-bearing lumbar type vertebral bodies, with hypoplastic twelfth ribs noted. Stable postsurgical changes at L4/L5. There are no acute displaced fractures. Anterior osteophytes at the thoracolumbar junction unchanged. Prominent facet hypertrophy at L2-3 and L3-4. IMPRESSION: 1. Stable postsurgical and  degenerative changes.  No acute fracture. Electronically Signed   By: Randa Ngo M.D.   On: 09/15/2019 19:53   CT Head Wo Contrast  Result Date: 09/15/2019 CLINICAL DATA:  [place] last night, pain EXAM: CT HEAD WITHOUT CONTRAST CT CERVICAL SPINE WITHOUT CONTRAST TECHNIQUE: Multidetector CT imaging of the head and cervical spine was performed following the standard protocol without intravenous contrast. Multiplanar CT image reconstructions of the cervical spine were also generated. COMPARISON:  05/14/2019 FINDINGS: CT HEAD FINDINGS Brain: No acute infarct or hemorrhage. Lateral ventricles and midline structures are unremarkable. No acute extra-axial fluid collections. No mass effect. Vascular: No hyperdense vessel or unexpected calcification. Skull: Normal. Negative for fracture or focal lesion. Sinuses/Orbits: No acute finding. Other: None. CT CERVICAL SPINE FINDINGS Alignment: Alignment is anatomic. Skull base and vertebrae: No acute displaced fracture. Soft tissues and spinal canal: No prevertebral fluid or swelling. No visible canal hematoma. Disc levels: At C5/C6 there is significant central disc osteophyte complex with at least mild central canal stenosis. At C6/C7, there is mild central disc osteophyte complex without significant compressive sequela. Prominent anterior osteophytes are seen at C3-4 and C4-5, without significant compressive sequela. Upper chest: Airway is patent. Emphysematous changes are seen at the lung apices. Other: Reconstructed images demonstrate no additional findings. IMPRESSION: 1. No acute intracranial process. 2. No acute cervical spine fracture. 3. Significant central disc osteophyte complex at C5/C6 with at least mild central canal stenosis. 4. Emphysema (ICD10-J43.9). Electronically Signed   By: Diana Eves.D.  On: 09/15/2019 19:58   CT Cervical Spine Wo Contrast  Result Date: 09/15/2019 CLINICAL DATA:  [place] last night, pain EXAM: CT HEAD WITHOUT CONTRAST CT CERVICAL  SPINE WITHOUT CONTRAST TECHNIQUE: Multidetector CT imaging of the head and cervical spine was performed following the standard protocol without intravenous contrast. Multiplanar CT image reconstructions of the cervical spine were also generated. COMPARISON:  05/14/2019 FINDINGS: CT HEAD FINDINGS Brain: No acute infarct or hemorrhage. Lateral ventricles and midline structures are unremarkable. No acute extra-axial fluid collections. No mass effect. Vascular: No hyperdense vessel or unexpected calcification. Skull: Normal. Negative for fracture or focal lesion. Sinuses/Orbits: No acute finding. Other: None. CT CERVICAL SPINE FINDINGS Alignment: Alignment is anatomic. Skull base and vertebrae: No acute displaced fracture. Soft tissues and spinal canal: No prevertebral fluid or swelling. No visible canal hematoma. Disc levels: At C5/C6 there is significant central disc osteophyte complex with at least mild central canal stenosis. At C6/C7, there is mild central disc osteophyte complex without significant compressive sequela. Prominent anterior osteophytes are seen at C3-4 and C4-5, without significant compressive sequela. Upper chest: Airway is patent. Emphysematous changes are seen at the lung apices. Other: Reconstructed images demonstrate no additional findings. IMPRESSION: 1. No acute intracranial process. 2. No acute cervical spine fracture. 3. Significant central disc osteophyte complex at C5/C6 with at least mild central canal stenosis. 4. Emphysema (ICD10-J43.9). Electronically Signed   By: Randa Ngo M.D.   On: 09/15/2019 19:58   DG Foot Complete Right  Result Date: 09/15/2019 CLINICAL DATA:  [place], pain, bruising and swelling of right foot EXAM: RIGHT FOOT COMPLETE - 3+ VIEW COMPARISON:  04/23/2019 FINDINGS: Frontal, oblique, and lateral views of the right foot are obtained. There is a minimally displaced fracture at the base of the fourth proximal phalanx, best seen on the oblique projection. No other  acute displaced fractures. Prominent dorsal soft tissue swelling of the forefoot. IMPRESSION: 1. Minimally displaced fracture at the base of the fourth proximal phalanx, with prominent soft tissue swelling of the dorsum of the forefoot. Electronically Signed   By: Randa Ngo M.D.   On: 09/15/2019 19:52    Procedures Procedures (including critical care time)  Medications Ordered in ED Medications  acetaminophen (TYLENOL) tablet 650 mg (650 mg Oral Given 09/15/19 1933)    ED Course  I have reviewed the triage vital signs and the nursing notes.  Pertinent labs & imaging results that were available during my care of the patient were reviewed by me and considered in my medical decision making (see chart for details).  Clinical Course as of Sep 14 2125  Thu Sep 15, 7922  [id number] 54 year old male presents for evaluation after a fall which occurred yesterday evening.  CT of the head and C-spine without acute findings.  X-ray of lumbar spine without acute findings, x-ray of the right foot shows a fracture of the right fourth toe.  Toes will be buddy taped, placed in a postop shoe and referred to podiatry for follow-up.  Patient is advised to follow-up with his PCP regarding questions for his blood pressure medication compliance.   [LM]    Clinical Course User Index [LM] Roque Lias   MDM Rules/Calculators/A&P                       Final Clinical Impression(s) / ED Diagnoses Final diagnoses:  Fall, initial encounter  Closed nondisplaced fracture of proximal phalanx of lesser toe of right foot, initial encounter  Rx / DC Orders ED Discharge Orders    None       Roque Lias 09/15/19 2141    Maudie Flakes, MD 09/21/19 [phone]

## 2019-09-15 NOTE — ED Notes (Signed)
Pt triaged during downtime.  Pt reports fall last night, no loc. C.o pain to his right foot (swelling and bruising noted) as well as pain to his knee and back. Pt alert and oriented, able to stand and transfer out of wheelchair.

## 2019-09-15 NOTE — Discharge Instructions (Addendum)
Follow-up with your doctor, please call the office tomorrow to clarify blood pressure medication. Referred to podiatry for follow-up for fractured toe (right fourth). You can buddy tape to toe as demonstrated today in the emergency room, wear stiff supportive shoe.  You can apply ice for 20 minutes at a time and elevate the foot to help with pain and swelling.

## 2019-09-15 NOTE — Telephone Encounter (Signed)
Will route for review.

## 2019-09-16 DIAGNOSIS — F4310 Post-traumatic stress disorder, unspecified: Secondary | ICD-10-CM | POA: Diagnosis not present

## 2019-09-16 NOTE — Telephone Encounter (Signed)
Per Ed note patient is not taking any blood pressure medication due to him stating a provider told him to stop.  Patient will be called and informed to restart medication as blood pressure is high.

## 2019-09-21 ENCOUNTER — Telehealth: Payer: Self-pay

## 2019-09-21 DIAGNOSIS — Z4801 Encounter for change or removal of surgical wound dressing: Secondary | ICD-10-CM | POA: Diagnosis not present

## 2019-09-21 DIAGNOSIS — Z48817 Encounter for surgical aftercare following surgery on the skin and subcutaneous tissue: Secondary | ICD-10-CM | POA: Diagnosis not present

## 2019-09-21 DIAGNOSIS — G8929 Other chronic pain: Secondary | ICD-10-CM | POA: Diagnosis not present

## 2019-09-21 DIAGNOSIS — L723 Sebaceous cyst: Secondary | ICD-10-CM | POA: Diagnosis not present

## 2019-09-21 DIAGNOSIS — N183 Chronic kidney disease, stage 3 unspecified: Secondary | ICD-10-CM | POA: Diagnosis not present

## 2019-09-21 DIAGNOSIS — S92404A Nondisplaced unspecified fracture of right great toe, initial encounter for closed fracture: Secondary | ICD-10-CM

## 2019-09-21 DIAGNOSIS — F0390 Unspecified dementia without behavioral disturbance: Secondary | ICD-10-CM | POA: Diagnosis not present

## 2019-09-21 DIAGNOSIS — E1140 Type 2 diabetes mellitus with diabetic neuropathy, unspecified: Secondary | ICD-10-CM | POA: Diagnosis not present

## 2019-09-21 DIAGNOSIS — I129 Hypertensive chronic kidney disease with stage 1 through stage 4 chronic kidney disease, or unspecified chronic kidney disease: Secondary | ICD-10-CM | POA: Diagnosis not present

## 2019-09-21 DIAGNOSIS — E1122 Type 2 diabetes mellitus with diabetic chronic kidney disease: Secondary | ICD-10-CM | POA: Diagnosis not present

## 2019-09-21 NOTE — Telephone Encounter (Signed)
Pt states fractured RT foot in 2 places. Great toe and third toe. [employer] requires referral from PCP for podiatry. Please place referral per pt request.

## 2019-09-21 NOTE — Telephone Encounter (Signed)
Patient is needing referral to podiatry for recent foot injury.

## 2019-09-22 ENCOUNTER — Ambulatory Visit: Payer: Medicare HMO | Admitting: Podiatry

## 2019-09-24 DIAGNOSIS — E1122 Type 2 diabetes mellitus with diabetic chronic kidney disease: Secondary | ICD-10-CM | POA: Diagnosis not present

## 2019-09-24 DIAGNOSIS — S92514D Nondisplaced fracture of proximal phalanx of right lesser toe(s), subsequent encounter for fracture with routine healing: Secondary | ICD-10-CM | POA: Diagnosis not present

## 2019-09-24 DIAGNOSIS — F250 Schizoaffective disorder, bipolar type: Secondary | ICD-10-CM | POA: Diagnosis not present

## 2019-09-24 DIAGNOSIS — M6281 Muscle weakness (generalized): Secondary | ICD-10-CM | POA: Diagnosis not present

## 2019-09-24 DIAGNOSIS — I129 Hypertensive chronic kidney disease with stage 1 through stage 4 chronic kidney disease, or unspecified chronic kidney disease: Secondary | ICD-10-CM | POA: Diagnosis not present

## 2019-09-24 DIAGNOSIS — N183 Chronic kidney disease, stage 3 unspecified: Secondary | ICD-10-CM | POA: Diagnosis not present

## 2019-09-24 DIAGNOSIS — F319 Bipolar disorder, unspecified: Secondary | ICD-10-CM | POA: Diagnosis not present

## 2019-09-24 DIAGNOSIS — G9349 Other encephalopathy: Secondary | ICD-10-CM | POA: Diagnosis not present

## 2019-09-24 DIAGNOSIS — G8929 Other chronic pain: Secondary | ICD-10-CM | POA: Diagnosis not present

## 2019-09-24 DIAGNOSIS — E1140 Type 2 diabetes mellitus with diabetic neuropathy, unspecified: Secondary | ICD-10-CM | POA: Diagnosis not present

## 2019-09-24 DIAGNOSIS — F0390 Unspecified dementia without behavioral disturbance: Secondary | ICD-10-CM | POA: Diagnosis not present

## 2019-09-26 DIAGNOSIS — F4310 Post-traumatic stress disorder, unspecified: Secondary | ICD-10-CM | POA: Diagnosis not present

## 2019-09-27 DIAGNOSIS — N183 Chronic kidney disease, stage 3 unspecified: Secondary | ICD-10-CM | POA: Diagnosis not present

## 2019-09-27 DIAGNOSIS — S92514D Nondisplaced fracture of proximal phalanx of right lesser toe(s), subsequent encounter for fracture with routine healing: Secondary | ICD-10-CM | POA: Diagnosis not present

## 2019-09-27 DIAGNOSIS — F0390 Unspecified dementia without behavioral disturbance: Secondary | ICD-10-CM | POA: Diagnosis not present

## 2019-09-27 DIAGNOSIS — F319 Bipolar disorder, unspecified: Secondary | ICD-10-CM | POA: Diagnosis not present

## 2019-09-27 DIAGNOSIS — E1122 Type 2 diabetes mellitus with diabetic chronic kidney disease: Secondary | ICD-10-CM | POA: Diagnosis not present

## 2019-09-27 DIAGNOSIS — F250 Schizoaffective disorder, bipolar type: Secondary | ICD-10-CM | POA: Diagnosis not present

## 2019-09-27 DIAGNOSIS — G8929 Other chronic pain: Secondary | ICD-10-CM | POA: Diagnosis not present

## 2019-09-27 DIAGNOSIS — E1140 Type 2 diabetes mellitus with diabetic neuropathy, unspecified: Secondary | ICD-10-CM | POA: Diagnosis not present

## 2019-09-27 DIAGNOSIS — I129 Hypertensive chronic kidney disease with stage 1 through stage 4 chronic kidney disease, or unspecified chronic kidney disease: Secondary | ICD-10-CM | POA: Diagnosis not present

## 2019-09-28 ENCOUNTER — Telehealth: Payer: Self-pay | Admitting: Family Medicine

## 2019-09-28 NOTE — Telephone Encounter (Signed)
Patient needs prescription refill to [facility]

## 2019-09-29 ENCOUNTER — Ambulatory Visit: Payer: Medicare HMO | Admitting: Orthopedic Surgery

## 2019-09-29 NOTE — Progress Notes (Signed)
Patient ID: [name], male   DOB: [birth year], 54 y.o.   MRN: [id number]     [name], is a 54 y.o. male  IOE:[id number]  HWE:[id number]  DOB - [birth year]  Subjective:  Chief Complaint and HPI: [name] is a 54 y.o. male here today  for a follow up visit After a fall and ED visit 09/15/2019.  His sister is here with him. They want all of his meds sent to [facility].    He missed his f/up appt with ortho for the 4th phalanx fracture.    Also, he was taken off of his lisinopril/hct 10/12.5 due to low blood pressure readings after a fall but now blood pressures are running 140-155/80-90.     From ED notes: 54 year old male presents for evaluation after a fall which occurred yesterday evening.  CT of the head and C-spine without acute findings.  X-ray of lumbar spine without acute findings, x-ray of the right foot shows a fracture of the right fourth toe.  Toes will be buddy taped, placed in a postop shoe and referred to podiatry for follow-up.  Patient is advised to follow-up with his PCP regarding questions for his blood pressure medication compliance.   Fall, initial encounter  Closed nondisplaced fracture of proximal phalanx of lesser toe of right foot, initial encounter    ED/Hospital notes reviewed.     ROS:   Constitutional:  No f/c, No night sweats, No unexplained weight loss. EENT:  No vision changes, No blurry vision, No hearing changes. No mouth, throat, or ear problems.  Respiratory: No cough, No SOB Cardiac: No CP, no palpitations GI:  No abd pain, No N/V/D. GU: No Urinary s/sx Musculoskeletal: +foot pain(wearing post op shoe) Neuro: No headache, no dizziness, no motor weakness.  Skin: No rash Endocrine:  No polydipsia. No polyuria.  Psych: Denies SI/HI  No problems updated.  ALLERGIES: Allergies  Allergen Reactions   Vicodin [Hydrocodone-Acetaminophen] Itching    PAST MEDICAL HISTORY: Past Medical History:  Diagnosis Date   ADD (attention  deficit disorder)    Anxiety    Arthritis    right hip   Bipolar 1 disorder (HCC)    Blood in urine    CKD (chronic kidney disease), stage III    Creatinine elevation    Dementia ([place])    "early onset" (08/04/2017)   Depression    bipolar [facility]   Diabetes mellitus without complication ([place])    Family history of anesthesia complication    pt is unsure , but pt father may have been difficult to arouse    HCAP (healthcare-associated pneumonia) 10/31/2012   History of kidney stones    Hypertension    Hypogonadism male    Liver fatty degeneration    Microscopic hematuria    hereditary s/p Urology eval   Osteoarthritis of right hip 11/28/2011   2012 2015 s/p THR Severe  Dr Novella Olive     Pleural effusion 11/02/2012   Pneumonia 10-2012   Pneumonia, organism unspecified([facility]) 11/02/2012   Polysubstance dependence, non-opioid, in remission ([place])    remote   Primary osteoarthritis of left hip 05/22/2015   PTSD (post-traumatic stress disorder)    SOCIAL ANXIETY DISORDER    Suicide attempt by multiple drug overdose ([place]) 01/19/2016   Grieving his cat's death 08-05-2015    MEDICATIONS AT HOME: Prior to Admission medications   Medication Sig Start Date End Date Taking? Authorizing Provider  acetaminophen (TYLENOL) 325 MG tablet Take 2 tablets (650 mg total)  by mouth every 6 (six) hours as needed for mild pain (or Fever >/= 101). 08/11/17  Yes Elgergawy, Silver Huguenin, MD  ARIPiprazole (ABILIFY) 5 MG tablet TAKE 1 TABLET BY MOUTH DAILY FOR DEPRESSION Patient taking differently: Take 5 mg by mouth daily.  06/23/18  Yes [name], MD  atorvastatin (LIPITOR) 10 MG tablet Take 1 tablet (10 mg total) by mouth at bedtime. 10/05/19  Yes Jaryah Aracena, Dionne Bucy, PA-C  B-D UF III MINI PEN NEEDLES 31G X 5 MM MISC USE FOUR TIMES DAILY 04/05/19  Yes Newlin, Enobong, MD  buPROPion (WELLBUTRIN XL) 300 MG 24 hr tablet Take 1 tablet (300 mg total) by mouth daily. 10/13/16  Yes Elgergawy, Silver Huguenin,  MD  busPIRone (BUSPAR) 15 MG tablet Take 15 mg by mouth 2 (two) times daily.    Yes [provider]  Continuous Blood Gluc Receiver (FREESTYLE LIBRE 14 DAY READER) DEVI Use as directed 08/15/19  Yes [name], MD  Continuous Blood Gluc Sensor (FREESTYLE LIBRE 14 DAY SENSOR) MISC Use as directed 08/15/19  Yes [name], MD  diclofenac sodium (VOLTAREN) 1 % GEL Apply 4 g topically 4 (four) times daily. 02/04/17  Yes [name], MD  feeding supplement, ENSURE ENLIVE, (ENSURE ENLIVE) LIQD Take 237 mLs by mouth 2 (two) times daily between meals. 08/11/17  Yes Elgergawy, Silver Huguenin, MD  fluticasone (FLONASE) 50 MCG/ACT nasal spray Place 1 spray into both nostrils daily. SHAKE LIQUID AND USE 2 SPRAYS IN EACH NOSTRIL DAILY 10/05/19  Yes Malayzia Laforte M, PA-C  gabapentin (NEURONTIN) 300 MG capsule Take 1 capsule (300 mg total) by mouth 2 (two) times daily. 10/05/19  Yes Connie Lasater, Levada Dy M, PA-C  glucose blood (ACCU-CHEK GUIDE) test strip CHECK SUGAR FOUR TIMES DAILY 07/22/18  Yes Newlin, Enobong, MD  insulin aspart (NOVOLOG FLEXPEN) 100 UNIT/ML FlexPen Inject 0-12 Units into the skin 2 (two) times daily. per sliding scale 10/05/19  Yes Benancio Osmundson M, PA-C  insulin detemir (LEVEMIR FLEXTOUCH) 100 UNIT/ML FlexPen Inject 39 Units into the skin 2 (two) times daily. 10/05/19  Yes Argentina Donovan, PA-C  lidocaine (LIDODERM) 5 % Place 1 patch onto the skin daily. Remove & Discard patch within 12 hours or as directed by MD 05/12/18  Yes [name], MD  linagliptin (TRADJENTA) 5 MG TABS tablet Take 1 tablet (5 mg total) by mouth daily. 10/05/19  Yes Wright Gravely, Dionne Bucy, PA-C  methocarbamol (ROBAXIN) 500 MG tablet Take 2 tablets (1,000 mg total) by mouth 2 (two) times daily. 08/25/19  Yes [name], MD  omeprazole (PRILOSEC) 40 MG capsule TAKE 1 CAPSULE(40 MG) BY MOUTH DAILY 08/25/19  Yes Newlin, Enobong, MD  QUEtiapine (SEROQUEL) 400 MG tablet Take 1 tablet (400 mg total) by mouth at bedtime.  10/13/16  Yes Elgergawy, Silver Huguenin, MD  tadalafil (CIALIS) 5 MG tablet TAKE ONE TABLET BY MOUTH EVERY DAY FOR BPH 02/21/19  Yes Newlin, Enobong, MD  tamsulosin (FLOMAX) 0.4 MG CAPS capsule Take 0.4 mg by mouth at bedtime. 05/02/19  Yes [provider]  Testosterone (ANDROGEL) 20.25 MG/1.25GM (1.62%) GEL Apply 20.25 mg topically every morning. Apply topically under arm pit alternating with each application. 02/21/19  Yes [name], MD  traMADol (ULTRAM) 50 MG tablet Take 1 tablet (50 mg total) by mouth at bedtime as needed (pain). 08/25/19  Yes [name], MD  traZODone (DESYREL) 150 MG tablet Take 1 tablet (150 mg total) by mouth at bedtime. 08/25/19  Yes [name], MD  clonazePAM (KLONOPIN) 1 MG  tablet Take 1 mg by mouth daily. Patient not taking: Reported on 10/05/2019    [provider]  divalproex (DEPAKOTE) 500 MG DR tablet Take 3 tablets (1,500 mg total) by mouth at bedtime. 10/13/16 09/14/19  Elgergawy, Silver Huguenin, MD  lisinopril-hydrochlorothiazide (ZESTORETIC) 10-12.5 MG tablet Take 1 tablet by mouth daily. 10/05/19   Argentina Donovan, PA-C     Objective:  EXAM:   Vitals:   10/05/19 1355  BP: (!) 147/83  Pulse: 83  Temp: 97.7 F (36.5 C)  TempSrc: Temporal  SpO2: 98%  Weight: 211 lb (95.7 kg)  Height: 6\' 5"  (1.956 m)    General appearance : A&OX3. NAD. Non-toxic-appearing HEENT: Atraumatic and Normocephalic.  PERRLA. EOM intact.  Chest/Lungs:  Breathing-non-labored, Good air entry bilaterally, breath sounds normal without rales, rhonchi, or wheezing  CVS: S1 S2 regular, no murmurs, gallops, rubs  Extremities: Bilateral Lower Ext shows no edema, both legs are warm to touch with = pulse throughout Neurology:  CN II-XII grossly intact, Non focal.   Psych:  TP linear. J/I fair. Normal speech. Appropriate eye contact and affect.  Skin:  No Rash  Data Review Lab Results  Component Value Date   HGBA1C 6.8 08/25/2019   HGBA1C 8.3 (H) 05/14/2019   HGBA1C  7.0 02/21/2019     Assessment & Plan   1. Type 2 diabetes mellitus with stage 3a chronic kidney disease, with long-term current use of insulin (HCC) Blood sugars at home improving.  Continue current regimen - Glucose (CBG) - insulin aspart (NOVOLOG FLEXPEN) 100 UNIT/ML FlexPen; Inject 0-12 Units into the skin 2 (two) times daily. per sliding scale  Dispense: 6 pen; Refill: 3 - atorvastatin (LIPITOR) 10 MG tablet; Take 1 tablet (10 mg total) by mouth at bedtime.  Dispense: 90 tablet; Refill: 1 - insulin detemir (LEVEMIR FLEXTOUCH) 100 UNIT/ML FlexPen; Inject 39 Units into the skin 2 (two) times daily.  Dispense: 6 pen; Refill: 3 - linagliptin (TRADJENTA) 5 MG TABS tablet; Take 1 tablet (5 mg total) by mouth daily.  Dispense: 90 tablet; Refill: 1  2. Seasonal allergies - fluticasone (FLONASE) 50 MCG/ACT nasal spray; Place 1 spray into both nostrils daily. SHAKE LIQUID AND USE 2 SPRAYS IN EACH NOSTRIL DAILY  Dispense: 16 g; Refill: 2  3. Essential hypertension Take 1/2 lisinopril HCTZ daily for 2 weeks and check blood pressure daily.  If not <130/85, take a whole tablet daily and continue to check blood pressure daily and bring to next visit - lisinopril-hydrochlorothiazide (ZESTORETIC) 10-12.5 MG tablet; Take 1 tablet by mouth daily.  Dispense: 90 tablet; Refill: 1  4. Closed nondisplaced fracture of phalanx of right great toe, unspecified phalanx, initial encounter Call ortho for missed appt-info provided - Ambulatory referral to Orthopedic Surgery  5. Anxiety state High PHQ-9 and GAD7 - gabapentin (NEURONTIN) 300 MG capsule; Take 1 capsule (300 mg total) by mouth 2 (two) times daily.  Dispense: 180 capsule; Refill: 1 - Ambulatory referral to Social Work  6. Hospital discharge follow-up   Patient have been counseled extensively about nutrition and exercise  Return in about 3 weeks (around 10/26/2019) for WITH DR NEWLIN;  needs morning appointment.  The patient was given clear  instructions to go to ER or return to medical center if symptoms don't improve, worsen or new problems develop. The patient verbalized understanding. The patient was told to call to get lab results if they haven't heard anything in the next week.     Freeman Caldron, PA-C [place]  [facility] [place], [place]   10/05/2019, 2:30 PM

## 2019-10-04 DIAGNOSIS — F4310 Post-traumatic stress disorder, unspecified: Secondary | ICD-10-CM | POA: Diagnosis not present

## 2019-10-05 ENCOUNTER — Other Ambulatory Visit: Payer: Self-pay

## 2019-10-05 ENCOUNTER — Ambulatory Visit: Payer: Medicare HMO | Attending: Family Medicine | Admitting: Physician Assistant

## 2019-10-05 ENCOUNTER — Encounter: Payer: Self-pay | Admitting: Family Medicine

## 2019-10-05 VITALS — BP 147/83 | HR 83 | Temp 97.7°F | Ht 77.0 in | Wt 211.0 lb

## 2019-10-05 DIAGNOSIS — N1831 Chronic kidney disease, stage 3a: Secondary | ICD-10-CM

## 2019-10-05 DIAGNOSIS — S92404A Nondisplaced unspecified fracture of right great toe, initial encounter for closed fracture: Secondary | ICD-10-CM

## 2019-10-05 DIAGNOSIS — J302 Other seasonal allergic rhinitis: Secondary | ICD-10-CM | POA: Diagnosis not present

## 2019-10-05 DIAGNOSIS — Z794 Long term (current) use of insulin: Secondary | ICD-10-CM

## 2019-10-05 DIAGNOSIS — E1121 Type 2 diabetes mellitus with diabetic nephropathy: Secondary | ICD-10-CM

## 2019-10-05 DIAGNOSIS — F411 Generalized anxiety disorder: Secondary | ICD-10-CM

## 2019-10-05 DIAGNOSIS — Z09 Encounter for follow-up examination after completed treatment for conditions other than malignant neoplasm: Secondary | ICD-10-CM | POA: Diagnosis not present

## 2019-10-05 DIAGNOSIS — I10 Essential (primary) hypertension: Secondary | ICD-10-CM | POA: Diagnosis not present

## 2019-10-05 LAB — GLUCOSE, POCT (MANUAL RESULT ENTRY): POC Glucose: 159 mg/dl — AB (ref 70–99)

## 2019-10-05 MED ORDER — LEVEMIR FLEXTOUCH 100 UNIT/ML ~~LOC~~ SOPN: 39.0000 [IU] | PEN_INJECTOR | Freq: Two times a day (BID) | SUBCUTANEOUS | 3 refills | Status: DC

## 2019-10-05 MED ORDER — LISINOPRIL-HYDROCHLOROTHIAZIDE 10-12.5 MG PO TABS: 1.0000 | ORAL_TABLET | Freq: Every day | ORAL | 1 refills | Status: DC

## 2019-10-05 MED ORDER — FLUTICASONE PROPIONATE 50 MCG/ACT NA SUSP: 1.0000 | Freq: Every day | NASAL | 2 refills | Status: DC

## 2019-10-05 MED ORDER — ATORVASTATIN CALCIUM 10 MG PO TABS: 10.0000 mg | ORAL_TABLET | Freq: Every day | ORAL | 1 refills | Status: DC

## 2019-10-05 MED ORDER — GABAPENTIN 300 MG PO CAPS: 300.0000 mg | ORAL_CAPSULE | Freq: Two times a day (BID) | ORAL | 1 refills | Status: DC

## 2019-10-05 MED ORDER — LINAGLIPTIN 5 MG PO TABS: 5.0000 mg | ORAL_TABLET | Freq: Every day | ORAL | 1 refills | Status: DC

## 2019-10-05 MED ORDER — NOVOLOG FLEXPEN 100 UNIT/ML ~~LOC~~ SOPN: 0.0000 [IU] | PEN_INJECTOR | Freq: Two times a day (BID) | SUBCUTANEOUS | 3 refills | Status: DC

## 2019-10-05 NOTE — Patient Instructions (Addendum)
You missed your appointment with [facility] on 09/29/2019 for your foot fracture.  Please call [phone] to reschedule  Take 1/2 lisinopril HCTZ daily for 2 weeks and check blood pressure daily.  If not <130/85, take a whole tablet daily and continue to check blood pressure daily and bring to next visit  Send Dr Margarita Rana a MyChart message regarding the prior authorization

## 2019-10-06 ENCOUNTER — Other Ambulatory Visit: Payer: Self-pay | Admitting: Family Medicine

## 2019-10-06 MED ORDER — FREESTYLE LIBRE 14 DAY SENSOR MISC: 4 refills | Status: DC

## 2019-10-07 DIAGNOSIS — F0390 Unspecified dementia without behavioral disturbance: Secondary | ICD-10-CM | POA: Diagnosis not present

## 2019-10-07 DIAGNOSIS — S92514D Nondisplaced fracture of proximal phalanx of right lesser toe(s), subsequent encounter for fracture with routine healing: Secondary | ICD-10-CM | POA: Diagnosis not present

## 2019-10-07 DIAGNOSIS — G8929 Other chronic pain: Secondary | ICD-10-CM | POA: Diagnosis not present

## 2019-10-07 DIAGNOSIS — I129 Hypertensive chronic kidney disease with stage 1 through stage 4 chronic kidney disease, or unspecified chronic kidney disease: Secondary | ICD-10-CM | POA: Diagnosis not present

## 2019-10-07 DIAGNOSIS — E1140 Type 2 diabetes mellitus with diabetic neuropathy, unspecified: Secondary | ICD-10-CM | POA: Diagnosis not present

## 2019-10-07 DIAGNOSIS — F250 Schizoaffective disorder, bipolar type: Secondary | ICD-10-CM | POA: Diagnosis not present

## 2019-10-07 DIAGNOSIS — N183 Chronic kidney disease, stage 3 unspecified: Secondary | ICD-10-CM | POA: Diagnosis not present

## 2019-10-07 DIAGNOSIS — E1122 Type 2 diabetes mellitus with diabetic chronic kidney disease: Secondary | ICD-10-CM | POA: Diagnosis not present

## 2019-10-07 DIAGNOSIS — F319 Bipolar disorder, unspecified: Secondary | ICD-10-CM | POA: Diagnosis not present

## 2019-10-10 ENCOUNTER — Ambulatory Visit (INDEPENDENT_AMBULATORY_CARE_PROVIDER_SITE_OTHER): Payer: Medicare HMO | Admitting: Orthopedic Surgery

## 2019-10-10 ENCOUNTER — Encounter: Payer: Self-pay | Admitting: Orthopedic Surgery

## 2019-10-10 VITALS — Ht 77.0 in | Wt 211.0 lb

## 2019-10-10 DIAGNOSIS — M79671 Pain in right foot: Secondary | ICD-10-CM

## 2019-10-10 NOTE — Progress Notes (Signed)
Office Visit Note   Patient: [name]           Date of Birth: 07-05-1965           MRN: [id number] Visit Date: 10/10/2019              Requested by: Argentina Donovan, PA-C [place],  [place] [address] PCP: [name], MD  Chief Complaint  Patient presents with  . Left Foot - Pain, Fracture, New Patient (Initial Visit)      HPI: Patient is a 54 year old gentleman who was seen for initial evaluation for his right foot.  Patient states he had a ground-level fall landed on outstretched upper extremities.  Patient states he has a history of falling uses a rolling walker.  Patient states he does not have pain in the foot was told he had a foot fracture of the fourth toe and is in a postoperative shoe.  Patient states he does have neuropathy.  Assessment & Plan: Visit Diagnoses:  1. Pain in right foot     Plan: Patient may resume regular shoewear there is no fracture or ligamentous injury to the right foot.  Follow-Up Instructions: Return if symptoms worsen or fail to improve.   Ortho Exam  Patient is alert, oriented, no adenopathy, well-dressed, normal affect, normal respiratory effort. Examination patient has a good dorsalis pedis and posterior tibial pulse he has dorsiflexion the ankle to neutral good subtalar motion there is no redness no cellulitis no bruising of the right foot.  There is no tenderness to palpation there is no swelling of the toes.  Review of his radiographs shows a sesamoid bone beneath the fourth and fifth metatarsal head there is no fracture of the proximal phalanx fourth toe this is an overlying shadow from the sesamoid bone.  Imaging: No results found. No images are attached to the encounter.  Labs: Lab Results  Component Value Date   HGBA1C 6.8 08/25/2019   HGBA1C 8.3 (H) 05/14/2019   HGBA1C 7.0 02/21/2019   ESRSEDRATE 1 10/12/2015   ESRSEDRATE 6 11/17/2013   REPTSTATUS 09/18/2016 FINAL 09/15/2016   GRAMSTAIN   11/02/2012    RARE WBC PRESENT, PREDOMINANTLY PMN NO ORGANISMS SEEN   CULT >=100,000 COLONIES/mL ESCHERICHIA COLI (A) 09/15/2016   LABORGA ESCHERICHIA COLI (A) 09/15/2016     Lab Results  Component Value Date   ALBUMIN 4.8 08/25/2019   ALBUMIN 2.9 (L) 05/17/2019   ALBUMIN 3.6 04/22/2019    Lab Results  Component Value Date   MG 1.9 09/09/2016   MG 1.7 10/05/2015   MG 1.9 10/03/2015   Lab Results  Component Value Date   VD25OH 48.1 08/04/2017   VD25OH 82.56 04/18/2015   VD25OH 54.66 11/30/2014    No results found for: PREALBUMIN CBC EXTENDED Latest Ref Rng & Units 05/17/2019 05/16/2019 05/15/2019  WBC 4.0 - 10.5 K/uL 3.5(L) 3.8(L) 3.9(L)  RBC 4.22 - 5.81 MIL/uL 3.73(L) 3.91(L) 4.01(L)  HGB 13.0 - 17.0 g/dL 11.4(L) 11.8(L) 12.1(L)  HCT 39 - 52 % 33.1(L) 34.8(L) 35.6(L)  PLT 150 - 400 K/uL 57(L) 58(L) 55(L)  NEUTROABS 1.7 - 7.7 K/uL 1.2(L) - -  LYMPHSABS 0.7 - 4.0 K/uL 1.9 - -     Body mass index is 25.02 kg/m.  Orders:  No orders of the defined types were placed in this encounter.  No orders of the defined types were placed in this encounter.    Procedures: No procedures performed  Clinical Data: No additional  findings.  ROS:  All other systems negative, except as noted in the HPI. Review of Systems  Objective: Vital Signs: Ht 6\' 5"  (1.956 m)   Wt 211 lb (95.7 kg)   BMI 25.02 kg/m   Specialty Comments:  No specialty comments available.  PMFS History: Patient Active Problem List   Diagnosis Date Noted  . Recurrent falls 05/15/2019  . AKI (acute kidney injury) ([place]) 05/14/2019  . Closed displaced fracture of lateral malleolus of left fibula 08/02/2017  . BPH (benign prostatic hyperplasia) 07/24/2017  . Hemorrhoid 05/27/2017  . Shoulder arthritis 05/27/2017  . Diabetic neuropathy ([place]) 05/27/2017  . Degenerative disc disease, lumbar 02/04/2017  . Constipation   . History of seizures 09/20/2016  . Acute lower UTI   . Urinary retention   . HLD  (hyperlipidemia) 09/07/2016  . GERD (gastroesophageal reflux disease) 09/07/2016  . PTSD (post-traumatic stress disorder) 09/07/2016  . Fall 09/07/2016  . Chronic kidney disease   . Ataxia 10/14/2015  . Hypokalemia 10/03/2015  . Hemiparesis ([place])   . Dyslipidemia 05/31/2014  . Insomnia 11/11/2013  . Degenerative joint disease (DJD) of hip 08/16/2013  . Acute midline low back pain without sciatica 05/20/2011  . Erectile dysfunction 02/17/2011  . Constipation - functional 02/17/2011  . Diabetes type 2, uncontrolled ([place]) 11/06/2010  . Hypogonadism male 05/20/2010  . TOBACCO USER 05/20/2010  . Demoralization and apathy 05/20/2010  . Schizoaffective disorder, bipolar type ([place]) 01/18/2010  . Right shoulder pain 01/18/2010  . Anxiety state 12/05/2006  . Essential hypertension 12/05/2006   Past Medical History:  Diagnosis Date  . ADD (attention deficit disorder)   . Anxiety   . Arthritis    right hip  . Bipolar 1 disorder ([place])   . Blood in urine   . CKD (chronic kidney disease), stage III   . Creatinine elevation   . Dementia ([place])    "early onset" (08/04/2017)  . Depression    bipolar [facility]  . Diabetes mellitus without complication ([place])   . Family history of anesthesia complication    pt is unsure , but pt father may have been difficult to arouse   . HCAP (healthcare-associated pneumonia) 10/31/2012  . History of kidney stones   . Hypertension   . Hypogonadism male   . Liver fatty degeneration   . Microscopic hematuria    hereditary s/p Urology eval  . Osteoarthritis of right hip 11/28/2011   2012 2015 s/p THR Severe  Dr Novella Olive    . Pleural effusion 11/02/2012  . Pneumonia 10-2012  . Pneumonia, organism unspecified([facility]) 11/02/2012  . Polysubstance dependence, non-opioid, in remission ([place])    remote  . Primary osteoarthritis of left hip 05/22/2015  . PTSD (post-traumatic stress disorder)    SOCIAL ANXIETY DISORDER   . Suicide attempt by multiple drug overdose  ([place]) January 05, 2016   Grieving his cat's death 07-22-15    Family History  Problem Relation Age of Onset  . Diabetes Father   . Cancer Mother        died of melanoma with mets  . Cervical cancer Sister   . Diabetes Sister   . Other Neg Hx        hypogonadism    Past Surgical History:  Procedure Laterality Date  . BACK SURGERY    . CLOSED REDUCTION METACARPAL WITH PERCUTANEOUS PINNING Right   . LUMBAR DISC SURGERY    . TONSILLECTOMY    . TOTAL HIP ARTHROPLASTY Right 08/16/2013   Procedure: TOTAL HIP ARTHROPLASTY ANTERIOR  APPROACH;  Surgeon: Hessie Dibble, MD;  Location: [facility];  Service: Orthopedics;  Laterality: Right;  . TOTAL HIP ARTHROPLASTY Left 05/22/2015   Procedure: TOTAL HIP ARTHROPLASTY ANTERIOR APPROACH;  Surgeon: Melrose Nakayama, MD;  Location: [facility];  Service: Orthopedics;  Laterality: Left;   Social History   Occupational History  . Occupation: disability  Tobacco Use  . Smoking status: Former Smoker    Years: 0.25    Types: Cigarettes    Quit date: 03/22/2019    Years since quitting: 0.5  . Smokeless tobacco: Never Used  Vaping Use  . Vaping Use: Never used  Substance and Sexual Activity  . Alcohol use: No  . Drug use: No    Comment: hx of marijuana/cocaine/crack use but sober since 20's  . Sexual activity: Not Currently

## 2019-10-11 ENCOUNTER — Encounter: Payer: Self-pay | Admitting: Family Medicine

## 2019-10-12 DIAGNOSIS — I10 Essential (primary) hypertension: Secondary | ICD-10-CM | POA: Diagnosis not present

## 2019-10-12 DIAGNOSIS — M545 Low back pain: Secondary | ICD-10-CM | POA: Diagnosis not present

## 2019-10-12 DIAGNOSIS — M48061 Spinal stenosis, lumbar region without neurogenic claudication: Secondary | ICD-10-CM | POA: Diagnosis not present

## 2019-10-12 DIAGNOSIS — M5416 Radiculopathy, lumbar region: Secondary | ICD-10-CM | POA: Diagnosis not present

## 2019-10-12 DIAGNOSIS — M5136 Other intervertebral disc degeneration, lumbar region: Secondary | ICD-10-CM | POA: Diagnosis not present

## 2019-10-12 DIAGNOSIS — M546 Pain in thoracic spine: Secondary | ICD-10-CM | POA: Diagnosis not present

## 2019-10-12 DIAGNOSIS — Z6826 Body mass index (BMI) 26.0-26.9, adult: Secondary | ICD-10-CM | POA: Diagnosis not present

## 2019-10-13 ENCOUNTER — Encounter: Payer: Self-pay | Admitting: Family Medicine

## 2019-10-13 DIAGNOSIS — F250 Schizoaffective disorder, bipolar type: Secondary | ICD-10-CM | POA: Diagnosis not present

## 2019-10-13 DIAGNOSIS — E1140 Type 2 diabetes mellitus with diabetic neuropathy, unspecified: Secondary | ICD-10-CM | POA: Diagnosis not present

## 2019-10-13 DIAGNOSIS — E1122 Type 2 diabetes mellitus with diabetic chronic kidney disease: Secondary | ICD-10-CM | POA: Diagnosis not present

## 2019-10-13 DIAGNOSIS — S92514D Nondisplaced fracture of proximal phalanx of right lesser toe(s), subsequent encounter for fracture with routine healing: Secondary | ICD-10-CM | POA: Diagnosis not present

## 2019-10-13 DIAGNOSIS — G8929 Other chronic pain: Secondary | ICD-10-CM | POA: Diagnosis not present

## 2019-10-13 DIAGNOSIS — I129 Hypertensive chronic kidney disease with stage 1 through stage 4 chronic kidney disease, or unspecified chronic kidney disease: Secondary | ICD-10-CM | POA: Diagnosis not present

## 2019-10-13 DIAGNOSIS — F0390 Unspecified dementia without behavioral disturbance: Secondary | ICD-10-CM | POA: Diagnosis not present

## 2019-10-13 DIAGNOSIS — N183 Chronic kidney disease, stage 3 unspecified: Secondary | ICD-10-CM | POA: Diagnosis not present

## 2019-10-13 DIAGNOSIS — F319 Bipolar disorder, unspecified: Secondary | ICD-10-CM | POA: Diagnosis not present

## 2019-10-14 ENCOUNTER — Other Ambulatory Visit: Payer: Self-pay | Admitting: Pharmacist

## 2019-10-17 ENCOUNTER — Ambulatory Visit (AMBULATORY_SURGERY_CENTER): Payer: Self-pay

## 2019-10-17 ENCOUNTER — Other Ambulatory Visit: Payer: Self-pay

## 2019-10-17 ENCOUNTER — Telehealth: Payer: Self-pay

## 2019-10-17 VITALS — Ht 77.0 in | Wt 211.0 lb

## 2019-10-17 DIAGNOSIS — Z1211 Encounter for screening for malignant neoplasm of colon: Secondary | ICD-10-CM

## 2019-10-17 MED ORDER — NA SULFATE-K SULFATE-MG SULF 17.5-3.13-1.6 GM/177ML PO SOLN
1.0000 | Freq: Once | ORAL | 0 refills | Status: DC
Start: 2019-10-17 — End: 2019-10-17

## 2019-10-17 MED ORDER — NA SULFATE-K SULFATE-MG SULF 17.5-3.13-1.6 GM/177ML PO SOLN: 1.0000 | Freq: Once | ORAL | 0 refills | Status: AC

## 2019-10-17 NOTE — Telephone Encounter (Signed)
Office evaluation with any physician or APP to further assess.

## 2019-10-17 NOTE — Telephone Encounter (Signed)
Pt had previsit today and could not step onto scale due to his limited mobility, he was admitted 09/15/19 for a fall at home.  He is extremely unsteady on his feet and walking with a walker but very weak.  He states he does use a wheelchair at times for mobility. It is a lot of effort for him to stand from a chair.  He states his PT was  Stopped after his fall and he feels he has gotten weaker.

## 2019-10-17 NOTE — Progress Notes (Signed)
No egg or soy allergy known to patient  No issues with past sedation with any surgeries  or procedures, no intubation problems  No diet pills per patient No home 02 use per patient  No blood thinners per patient  Pt denies issues with constipation  No A fib or A flutter  EMMI video sent to pt's e mail  COVID 19 guidelines implemented in [facility] today   Pt arrives with walker, takes a few minutes to stand from waiting room chair, unable to step onto scale, approx 3 inches and pt cannot raise his leg that much to step.  Sent note to Dr Fuller Plan and Osvaldo Angst to review if pt is acceptable for the [facility].    Dr Fuller Plan messages back to sched pt for OV.  Call to pt to schedule and left message.  Pt has been vaccinated for covid.  Pt instructed no ensure day before and day of procedure. Written on pt's instructions.  Tradjenta and insulin instructions reviewed at length with pt. And written on his instruction sheet.  Due to the COVID-19 pandemic we are asking patients to follow these guidelines. Please only bring one care partner. Please be aware that your care partner may wait in the car in the parking lot or if they feel like they will be too hot to wait in the car, they may wait in the lobby on the 4th floor. All care partners are required to wear a mask the entire time (we do not have any that we can provide them), they need to practice social distancing, and we will do a Covid check for all patient's and care partners when you arrive. Also we will check their temperature and your temperature. If the care partner waits in their car they need to stay in the parking lot the entire time and we will call them on their cell phone when the patient is ready for discharge so they can bring the car to the front of the building. Also all patient's will need to wear a mask into building.

## 2019-10-20 ENCOUNTER — Ambulatory Visit: Payer: Medicare HMO | Admitting: Physician Assistant

## 2019-10-20 DIAGNOSIS — F319 Bipolar disorder, unspecified: Secondary | ICD-10-CM | POA: Diagnosis not present

## 2019-10-20 DIAGNOSIS — G8929 Other chronic pain: Secondary | ICD-10-CM | POA: Diagnosis not present

## 2019-10-20 DIAGNOSIS — S92514D Nondisplaced fracture of proximal phalanx of right lesser toe(s), subsequent encounter for fracture with routine healing: Secondary | ICD-10-CM | POA: Diagnosis not present

## 2019-10-20 DIAGNOSIS — I129 Hypertensive chronic kidney disease with stage 1 through stage 4 chronic kidney disease, or unspecified chronic kidney disease: Secondary | ICD-10-CM | POA: Diagnosis not present

## 2019-10-20 DIAGNOSIS — E1140 Type 2 diabetes mellitus with diabetic neuropathy, unspecified: Secondary | ICD-10-CM | POA: Diagnosis not present

## 2019-10-20 DIAGNOSIS — F0390 Unspecified dementia without behavioral disturbance: Secondary | ICD-10-CM | POA: Diagnosis not present

## 2019-10-20 DIAGNOSIS — N183 Chronic kidney disease, stage 3 unspecified: Secondary | ICD-10-CM | POA: Diagnosis not present

## 2019-10-20 DIAGNOSIS — E1122 Type 2 diabetes mellitus with diabetic chronic kidney disease: Secondary | ICD-10-CM | POA: Diagnosis not present

## 2019-10-20 DIAGNOSIS — F250 Schizoaffective disorder, bipolar type: Secondary | ICD-10-CM | POA: Diagnosis not present

## 2019-10-21 ENCOUNTER — Telehealth: Payer: Self-pay

## 2019-10-21 MED ORDER — FREESTYLE LIBRE 14 DAY READER DEVI: 4 refills | Status: DC

## 2019-10-21 MED ORDER — FREESTYLE LIBRE 14 DAY SENSOR MISC: 4 refills | Status: DC

## 2019-10-21 NOTE — Telephone Encounter (Signed)
Done. Please fax to [facility].

## 2019-10-21 NOTE — Telephone Encounter (Signed)
Orders has been faxed over to [facility] [employer] per insurance guidelines.

## 2019-10-21 NOTE — Telephone Encounter (Signed)
Patient is needing a script for his freestyle meter and senor with DX codes and testing frequency sent to DME company.  [facility] will obtain prior auth.

## 2019-10-24 DIAGNOSIS — E1140 Type 2 diabetes mellitus with diabetic neuropathy, unspecified: Secondary | ICD-10-CM | POA: Diagnosis not present

## 2019-10-24 DIAGNOSIS — N183 Chronic kidney disease, stage 3 unspecified: Secondary | ICD-10-CM | POA: Diagnosis not present

## 2019-10-24 DIAGNOSIS — G8929 Other chronic pain: Secondary | ICD-10-CM | POA: Diagnosis not present

## 2019-10-24 DIAGNOSIS — M6281 Muscle weakness (generalized): Secondary | ICD-10-CM | POA: Diagnosis not present

## 2019-10-24 DIAGNOSIS — S92514D Nondisplaced fracture of proximal phalanx of right lesser toe(s), subsequent encounter for fracture with routine healing: Secondary | ICD-10-CM | POA: Diagnosis not present

## 2019-10-24 DIAGNOSIS — F319 Bipolar disorder, unspecified: Secondary | ICD-10-CM | POA: Diagnosis not present

## 2019-10-24 DIAGNOSIS — E1122 Type 2 diabetes mellitus with diabetic chronic kidney disease: Secondary | ICD-10-CM | POA: Diagnosis not present

## 2019-10-24 DIAGNOSIS — F0390 Unspecified dementia without behavioral disturbance: Secondary | ICD-10-CM | POA: Diagnosis not present

## 2019-10-24 DIAGNOSIS — I129 Hypertensive chronic kidney disease with stage 1 through stage 4 chronic kidney disease, or unspecified chronic kidney disease: Secondary | ICD-10-CM | POA: Diagnosis not present

## 2019-10-24 DIAGNOSIS — G9349 Other encephalopathy: Secondary | ICD-10-CM | POA: Diagnosis not present

## 2019-10-24 DIAGNOSIS — F250 Schizoaffective disorder, bipolar type: Secondary | ICD-10-CM | POA: Diagnosis not present

## 2019-10-25 ENCOUNTER — Telehealth: Payer: Self-pay | Admitting: Family Medicine

## 2019-10-25 NOTE — Telephone Encounter (Signed)
Please see below.

## 2019-10-25 NOTE — Telephone Encounter (Signed)
Floral with [facility] is calling in to request an extension on current PT  service.   Frequency: 1 week 4 for PT   CB: [phone]

## 2019-10-26 NOTE — Telephone Encounter (Signed)
Floral was called and given verbal orders for the patient.

## 2019-10-27 DIAGNOSIS — S92514D Nondisplaced fracture of proximal phalanx of right lesser toe(s), subsequent encounter for fracture with routine healing: Secondary | ICD-10-CM | POA: Diagnosis not present

## 2019-10-27 DIAGNOSIS — F250 Schizoaffective disorder, bipolar type: Secondary | ICD-10-CM | POA: Diagnosis not present

## 2019-10-27 DIAGNOSIS — F319 Bipolar disorder, unspecified: Secondary | ICD-10-CM | POA: Diagnosis not present

## 2019-10-27 DIAGNOSIS — E1122 Type 2 diabetes mellitus with diabetic chronic kidney disease: Secondary | ICD-10-CM | POA: Diagnosis not present

## 2019-10-27 DIAGNOSIS — G8929 Other chronic pain: Secondary | ICD-10-CM | POA: Diagnosis not present

## 2019-10-27 DIAGNOSIS — I129 Hypertensive chronic kidney disease with stage 1 through stage 4 chronic kidney disease, or unspecified chronic kidney disease: Secondary | ICD-10-CM | POA: Diagnosis not present

## 2019-10-27 DIAGNOSIS — F0390 Unspecified dementia without behavioral disturbance: Secondary | ICD-10-CM | POA: Diagnosis not present

## 2019-10-27 DIAGNOSIS — N183 Chronic kidney disease, stage 3 unspecified: Secondary | ICD-10-CM | POA: Diagnosis not present

## 2019-10-27 DIAGNOSIS — E1140 Type 2 diabetes mellitus with diabetic neuropathy, unspecified: Secondary | ICD-10-CM | POA: Diagnosis not present

## 2019-10-28 NOTE — Telephone Encounter (Signed)
Patient has been contacted and OV scheduled per [facility].  Procedure has been cancelled until evaluation in office.

## 2019-10-31 ENCOUNTER — Encounter: Payer: Medicare HMO | Admitting: Gastroenterology

## 2019-11-01 ENCOUNTER — Ambulatory Visit: Payer: Medicare HMO | Attending: Family Medicine | Admitting: Family Medicine

## 2019-11-01 ENCOUNTER — Other Ambulatory Visit: Payer: Self-pay | Admitting: Pharmacist

## 2019-11-01 ENCOUNTER — Telehealth: Payer: Self-pay

## 2019-11-01 ENCOUNTER — Other Ambulatory Visit: Payer: Self-pay

## 2019-11-01 ENCOUNTER — Encounter: Payer: Self-pay | Admitting: Family Medicine

## 2019-11-01 VITALS — BP 95/62 | HR 112 | Ht 77.0 in | Wt 209.0 lb

## 2019-11-01 DIAGNOSIS — Z794 Long term (current) use of insulin: Secondary | ICD-10-CM

## 2019-11-01 DIAGNOSIS — F250 Schizoaffective disorder, bipolar type: Secondary | ICD-10-CM | POA: Diagnosis not present

## 2019-11-01 DIAGNOSIS — E1121 Type 2 diabetes mellitus with diabetic nephropathy: Secondary | ICD-10-CM | POA: Diagnosis not present

## 2019-11-01 DIAGNOSIS — I129 Hypertensive chronic kidney disease with stage 1 through stage 4 chronic kidney disease, or unspecified chronic kidney disease: Secondary | ICD-10-CM | POA: Diagnosis not present

## 2019-11-01 DIAGNOSIS — N1831 Chronic kidney disease, stage 3a: Secondary | ICD-10-CM

## 2019-11-01 DIAGNOSIS — E291 Testicular hypofunction: Secondary | ICD-10-CM | POA: Diagnosis not present

## 2019-11-01 DIAGNOSIS — G8929 Other chronic pain: Secondary | ICD-10-CM | POA: Diagnosis not present

## 2019-11-01 DIAGNOSIS — I10 Essential (primary) hypertension: Secondary | ICD-10-CM

## 2019-11-01 DIAGNOSIS — J302 Other seasonal allergic rhinitis: Secondary | ICD-10-CM

## 2019-11-01 DIAGNOSIS — E1140 Type 2 diabetes mellitus with diabetic neuropathy, unspecified: Secondary | ICD-10-CM | POA: Diagnosis not present

## 2019-11-01 DIAGNOSIS — K219 Gastro-esophageal reflux disease without esophagitis: Secondary | ICD-10-CM

## 2019-11-01 DIAGNOSIS — F0390 Unspecified dementia without behavioral disturbance: Secondary | ICD-10-CM | POA: Diagnosis not present

## 2019-11-01 DIAGNOSIS — N183 Chronic kidney disease, stage 3 unspecified: Secondary | ICD-10-CM | POA: Diagnosis not present

## 2019-11-01 DIAGNOSIS — M5136 Other intervertebral disc degeneration, lumbar region: Secondary | ICD-10-CM

## 2019-11-01 DIAGNOSIS — E1122 Type 2 diabetes mellitus with diabetic chronic kidney disease: Secondary | ICD-10-CM | POA: Diagnosis not present

## 2019-11-01 DIAGNOSIS — R338 Other retention of urine: Secondary | ICD-10-CM

## 2019-11-01 DIAGNOSIS — S92514D Nondisplaced fracture of proximal phalanx of right lesser toe(s), subsequent encounter for fracture with routine healing: Secondary | ICD-10-CM | POA: Diagnosis not present

## 2019-11-01 DIAGNOSIS — F319 Bipolar disorder, unspecified: Secondary | ICD-10-CM | POA: Diagnosis not present

## 2019-11-01 LAB — POCT GLYCOSYLATED HEMOGLOBIN (HGB A1C): HbA1c, POC (controlled diabetic range): 9 % — AB (ref 0.0–7.0)

## 2019-11-01 LAB — GLUCOSE, POCT (MANUAL RESULT ENTRY)
POC Glucose: 432 mg/dl — AB (ref 70–99)
POC Glucose: 408 mg/dl — AB (ref 70–99)

## 2019-11-01 MED ORDER — INSULIN ASPART 100 UNIT/ML ~~LOC~~ SOLN
8.0000 [IU] | Freq: Once | SUBCUTANEOUS | Status: AC
  Administered 2019-11-01: 8 [IU] via SUBCUTANEOUS

## 2019-11-01 MED ORDER — TADALAFIL 5 MG PO TABS: ORAL_TABLET | ORAL | 0 refills | Status: DC

## 2019-11-01 MED ORDER — TRAMADOL HCL 50 MG PO TABS: 50.0000 mg | ORAL_TABLET | Freq: Every evening | ORAL | 1 refills | Status: DC | PRN

## 2019-11-01 MED ORDER — TRAZODONE HCL 150 MG PO TABS: 150.0000 mg | ORAL_TABLET | Freq: Every day | ORAL | 0 refills | Status: DC

## 2019-11-01 MED ORDER — FLUTICASONE PROPIONATE 50 MCG/ACT NA SUSP: 1.0000 | Freq: Every day | NASAL | 0 refills | Status: DC

## 2019-11-01 MED ORDER — ACCU-CHEK GUIDE ME W/DEVICE KIT: PACK | 0 refills | Status: DC

## 2019-11-01 MED ORDER — LEVEMIR FLEXTOUCH 100 UNIT/ML ~~LOC~~ SOPN: 43.0000 [IU] | PEN_INJECTOR | Freq: Two times a day (BID) | SUBCUTANEOUS | 6 refills | Status: DC

## 2019-11-01 MED ORDER — TESTOSTERONE 20.25 MG/1.25GM (1.62%) TD GEL: 20.2500 mg | Freq: Every morning | TRANSDERMAL | 3 refills | Status: DC

## 2019-11-01 MED ORDER — OMEPRAZOLE 40 MG PO CPDR: DELAYED_RELEASE_CAPSULE | ORAL | 1 refills | Status: DC

## 2019-11-01 NOTE — Telephone Encounter (Signed)
Flonase, Cialis, and trazodone sent. Tramadol was addressed today at his clinic visit by his PCP. Per chart review, he is on Cialis, not tamsulosin, for BPH. Hence, I will hold off on sending this rx.   Calcium and B12 rxs are not listed on his current profile. Will route to PCP for approval.

## 2019-11-01 NOTE — Patient Instructions (Signed)

## 2019-11-01 NOTE — Progress Notes (Signed)
Want all medication sent to [facility].

## 2019-11-01 NOTE — Telephone Encounter (Signed)
Flonase;Ketaconozal topical; pantosulin; vitamin B12; Tadalafil; tramadol (can be 30 days if required); trazodone, calcium.  [facility] called requesting prescriptions for 90 days.

## 2019-11-01 NOTE — Progress Notes (Signed)
Subjective:  Patient ID: CHIEF WALKUP, male    DOB: October 07, 1965  Age: 54 y.o. 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  He has been on 35 units of Levemir twice daily but has only been administering 2 units of NovoLog with meals as he was concerned he had no means of checking his blood sugar and did not want to have hypoglycemia. He took 5 smarties this morning as he thought his blood sugar was low and CBG is 432 in the clinic. He needs sensors. Was told by [place] he needed a prior authorization from his PCP.  I have written a prescription for a glucometer on several locations  for him the last of which was earlier this month.  He has low back pain which is chronic.  He has an MRI coming up scheduled by his Spine specialist Dr Vertell Limber.He does Tramadol qhs, last dose was last night and denies use of recreational drugs. Pain is 8/10 and radiates to leg with numb feet.  He ambulates with a walker and is currently undergoing physical therapy at home.  For his chronic kidney disease he is followed by nephrology at [facility].  He also sees urology for lower urinary tract symptoms which are uncontrolled on his current regimen. Past Medical History:  Diagnosis Date  . ADD (attention deficit disorder)   . Anxiety   . Arthritis    right hip  . Bipolar 1 disorder ([place])   . Blood in urine   . CKD (chronic kidney disease), stage III   . Creatinine elevation   . Dementia ([place])    "early onset" (08/04/2017)  . Depression    bipolar [facility]  . Diabetes mellitus without complication ([place])   . Family history of anesthesia complication    pt is unsure , but pt father may have been difficult to arouse   . HCAP  (healthcare-associated pneumonia) 10/31/2012  . History of kidney stones   . Hypertension   . Hypogonadism male   . Liver fatty degeneration   . Microscopic hematuria    hereditary s/p Urology eval  . Neuromuscular disorder ([name])    feet neuropathy   . Osteoarthritis of right hip 11/28/2011   2012 2015 s/p THR Severe  Dr Novella Olive    . Pleural effusion 11/02/2012  . Pneumonia 10-2012  . Pneumonia, organism unspecified(486) 11/02/2012  . Polysubstance dependence, non-opioid, in remission ([place])    remote  . Primary osteoarthritis of left hip 05/22/2015  . PTSD (post-traumatic stress disorder)    SOCIAL ANXIETY DISORDER   . Substance abuse ([place])   . Suicide attempt by multiple drug overdose ([place]) Jan 23, 2016   Grieving his cat's death Jun 25, 2015    Past Surgical History:  Procedure Laterality Date  . BACK SURGERY    . CLOSED REDUCTION METACARPAL WITH PERCUTANEOUS PINNING Right   . LUMBAR DISC SURGERY    . TONSILLECTOMY    . TOTAL HIP ARTHROPLASTY Right 08/16/2013   Procedure: TOTAL HIP ARTHROPLASTY ANTERIOR APPROACH;  Surgeon: Hessie Dibble, MD;  Location: [place];  Service: Orthopedics;  Laterality: Right;  . TOTAL HIP ARTHROPLASTY Left 05/22/2015   Procedure: TOTAL HIP ARTHROPLASTY ANTERIOR APPROACH;  Surgeon: Melrose Nakayama, MD;  Location: [place];  Service: Orthopedics;  Laterality: Left;    Family History  Problem Relation Age of Onset  . Diabetes Father   . Cancer Mother        died of melanoma with mets  . Cervical cancer Sister   . Diabetes Sister   . Other Neg Hx        hypogonadism  . Colon cancer Neg Hx   . Colon polyps Neg Hx   . Esophageal cancer Neg Hx   . Rectal cancer Neg Hx   . Stomach cancer Neg Hx     Allergies  Allergen Reactions  . Vicodin [Hydrocodone-Acetaminophen] Itching    Outpatient Medications Prior to Visit  Medication Sig Dispense Refill  . acetaminophen (TYLENOL) 325 MG tablet Take 2 tablets (650 mg total) by mouth every 6 (six) hours as needed for  mild pain (or Fever >/= 101).    . ARIPiprazole (ABILIFY) 5 MG tablet TAKE 1 TABLET BY MOUTH DAILY FOR DEPRESSION (Patient taking differently: Take 5 mg by mouth daily. ) 30 tablet 0  . atorvastatin (LIPITOR) 10 MG tablet Take 1 tablet (10 mg total) by mouth at bedtime. 90 tablet 1  . B-D UF III MINI PEN NEEDLES 31G X 5 MM MISC USE FOUR TIMES DAILY 100 each 12  . buPROPion (WELLBUTRIN XL) 300 MG 24 hr tablet Take 1 tablet (300 mg total) by mouth daily. 30 tablet 0  . busPIRone (BUSPAR) 15 MG tablet Take 15 mg by mouth 2 (two) times daily.     . clonazePAM (KLONOPIN) 1 MG tablet Take 1 mg by mouth daily.     . Continuous Blood Gluc Receiver (FREESTYLE LIBRE 14 DAY READER) DEVI Use as directed 1 each 4  . Continuous Blood Gluc Sensor (FREESTYLE LIBRE 14 DAY SENSOR) MISC Use as directed 2 each 4  . diclofenac sodium (VOLTAREN) 1 % GEL Apply 4 g topically 4 (four) times daily. 100 g 1  . feeding supplement, ENSURE ENLIVE, (ENSURE ENLIVE) LIQD Take 237 mLs by mouth 2 (two) times daily between meals. 237 mL 12  . gabapentin (NEURONTIN) 300 MG capsule Take 1 capsule (300 mg total) by mouth 2 (two) times daily. 180 capsule 1  . glucose blood (ACCU-CHEK GUIDE) test strip CHECK SUGAR FOUR TIMES DAILY 100 each 12  . insulin aspart (NOVOLOG FLEXPEN) 100 UNIT/ML FlexPen Inject 0-12 Units into the skin 2 (two) times daily. per sliding scale 6 pen 3  . lidocaine (LIDODERM) 5 % Place 1 patch onto the skin daily. Remove & Discard patch within 12 hours or as directed by MD 30 patch 0  . linagliptin (TRADJENTA) 5 MG TABS tablet Take 1 tablet (5 mg total) by mouth daily. 90 tablet 1  . lisinopril-hydrochlorothiazide (ZESTORETIC) 10-12.5 MG tablet Take 1 tablet by mouth daily. 90 tablet 1  . methocarbamol (ROBAXIN) 500 MG tablet Take 2 tablets (1,000 mg total) by mouth 2 (two) times daily. 120 tablet 3  . midodrine (PROAMATINE) 2.5 MG tablet Take 2.5 mg by mouth 2 (two) times daily.    Marland Kitchen NARCAN 4 MG/0.1ML LIQD nasal  spray kit CALL 911. ADMINISTER A SINGLE SPRAY OF NARCAN IN ONE NOSTRIL. REPEAT EVERY 3 MINUTES AS NEEDED IF NO OR MINIMAL RESPONSE.    Marland Kitchen QUEtiapine (SEROQUEL) 400 MG tablet Take 1 tablet (400 mg total) by mouth at bedtime. 30 tablet 11  . fluticasone (FLONASE) 50 MCG/ACT nasal spray Place 1 spray into both nostrils daily. SHAKE LIQUID AND USE 2 SPRAYS IN EACH NOSTRIL  DAILY 16 g 2  . insulin detemir (LEVEMIR FLEXTOUCH) 100 UNIT/ML FlexPen Inject 39 Units into the skin 2 (two) times daily. 6 pen 3  . omeprazole (PRILOSEC) 40 MG capsule TAKE 1 CAPSULE(40 MG) BY MOUTH DAILY 90 capsule 1  . tamsulosin (FLOMAX) 0.4 MG CAPS capsule Take 0.4 mg by mouth at bedtime.    . Testosterone (ANDROGEL) 20.25 MG/1.25GM (1.62%) GEL Apply 20.25 mg topically every morning. Apply topically under arm pit alternating with each application. 1.25 g 3  . traMADol (ULTRAM) 50 MG tablet Take 1 tablet (50 mg total) by mouth at bedtime as needed (pain). 30 tablet 1  . traZODone (DESYREL) 150 MG tablet Take 1 tablet (150 mg total) by mouth at bedtime. 90 tablet 1  . divalproex (DEPAKOTE) 500 MG DR tablet Take 3 tablets (1,500 mg total) by mouth at bedtime. 90 tablet 0  . tadalafil (CIALIS) 5 MG tablet TAKE ONE TABLET BY MOUTH EVERY DAY FOR BPH (Patient not taking: Reported on 10/17/2019) 30 tablet 2   No facility-administered medications prior to visit.     ROS Review of Systems  Constitutional: Negative for activity change and appetite change.  HENT: Negative for sinus pressure and sore throat.   Eyes: Negative for visual disturbance.  Respiratory: Negative for cough, chest tightness and shortness of breath.   Cardiovascular: Negative for chest pain and leg swelling.  Gastrointestinal: Negative for abdominal distention, abdominal pain, constipation and diarrhea.  Endocrine: Negative.   Genitourinary: Negative for dysuria.  Musculoskeletal: Positive for back pain. Negative for joint swelling and myalgias.  Skin: Negative  for rash.  Allergic/Immunologic: Negative.   Neurological: Positive for tremors, weakness and numbness. Negative for light-headedness.  Psychiatric/Behavioral: Negative for dysphoric mood and suicidal ideas.    Objective:  BP 95/62   Pulse (!) 112   Ht _0  (1.956 m)   Wt 209 lb (94.8 kg)   SpO2 96%   BMI 24.78 kg/m   BP/Weight 11/01/2019 10/17/2019 8/52/7782  Systolic BP 95 - -  Diastolic BP 62 - -  Wt. (Lbs) 209 211 211  BMI 24.78 25.02 25.02      Physical Exam Constitutional:      Appearance: He is well-developed.  Neck:     Vascular: No JVD.  Cardiovascular:     Rate and Rhythm: Normal rate.     Heart sounds: Normal heart sounds. No murmur heard.      Comments: Unable to palpate dorsalis pedis and posterior tibialis bilaterally Pulmonary:     Effort: Pulmonary effort is normal.     Breath sounds: Normal breath sounds. No wheezing or rales.  Chest:     Chest wall: No tenderness.  Abdominal:     General: Bowel sounds are normal. There is no distension.     Palpations: Abdomen is soft. There is no mass.     Tenderness: There is no abdominal tenderness.  Musculoskeletal:        General: Normal range of motion.     Right lower leg: No edema.     Left lower leg: No edema.  Neurological:     Mental Status: He is alert and oriented to person, place, and time.     Motor: Weakness present.     Gait: Gait abnormal.     Comments: Positive for tremors  Psychiatric:        Mood and Affect: Mood normal.     CMP Latest Ref Rng & Units 08/25/2019 05/18/2019 05/17/2019  Glucose 65 -  99 mg/dL 144(H) 109(H) 146(H)  BUN 6 - 24 mg/dL 33(H) 29(H) 32(H)  Creatinine 0.76 - 1.27 mg/dL 2.00(H) 1.91(H) 2.08(H)  Sodium 134 - 144 mmol/L 140 139 139  Potassium 3.5 - 5.2 mmol/L 4.6 4.2 4.8  Chloride 96 - 106 mmol/L 101 105 105  CO2 20 - 29 mmol/L _0 Calcium 8.7 - 10.2 mg/dL 9.9 9.3 8.9  Total Protein 6.0 - 8.5 g/dL 7.8 - 5.5(L)  Total Bilirubin 0.0 - 1.2 mg/dL 0.4 - 0.5    Alkaline Phos 39 - 117 IU/L 105 - 85  AST 0 - 40 IU/L 21 - 24  ALT 0 - 44 IU/L 9 - 16    Lipid Panel     Component Value Date/Time   CHOL 135 08/25/2019 1134   TRIG 249 (H) 08/25/2019 1134   HDL 37 (L) 08/25/2019 1134   CHOLHDL 3.6 08/25/2019 1134   CHOLHDL 7 06/02/2016 0844   VLDL 41 (H) 10/03/2015 0434   LDLCALC 58 08/25/2019 1134   LDLDIRECT 72.0 06/02/2016 0844    CBC    Component Value Date/Time   WBC 3.5 (L) 05/17/2019 0532   RBC 3.73 (L) 05/17/2019 0532   HGB 11.4 (L) 05/17/2019 0532   HGB 16.5 05/12/2018 1647   HCT 33.1 (L) 05/17/2019 0532   HCT 47.9 05/12/2018 1647   PLT 57 (L) 05/17/2019 0532   PLT 123 (L) 05/12/2018 1647   MCV 88.7 05/17/2019 0532   MCV 95 05/12/2018 1647   MCH 30.6 05/17/2019 0532   MCHC 34.4 05/17/2019 0532   RDW 12.0 05/17/2019 0532   RDW 15.4 05/12/2018 1647   LYMPHSABS 1.9 05/17/2019 0532   LYMPHSABS 2.3 05/12/2018 1647   MONOABS 0.4 05/17/2019 0532   EOSABS 0.0 05/17/2019 0532   EOSABS 0.0 05/12/2018 1647   BASOSABS 0.0 05/17/2019 0532   BASOSABS 0.0 05/12/2018 1647    Lab Results  Component Value Date   HGBA1C 9.0 (A) 11/01/2019    Assessment & Plan:  1. Type 2 diabetes mellitus with stage 3a chronic kidney disease, with long-term current use of insulin (HCC) Uncontrolled with A1c of 9.0; goal is less than 7.0 Elevated blood glucose of 432 with high risk for progression to diabetic ketoacidosis He is unable to produce urine to check for ketones 8 units of NovoLog administered and patient observed for 45 minutes, oral hydration provided Increase Levemir to 43 units twice daily Clinical pharmacist called in to see the patient to assist with care coordination and we have prescribed a glucometer for him Advised to resume NovoLog per sliding scale - POCT glucose (manual entry) - POCT glycosylated hemoglobin (Hb A1C) - insulin detemir (LEVEMIR FLEXTOUCH) 100 UNIT/ML FlexPen; Inject 43 Units into the skin 2 (two) times daily.   Dispense: 6 pen; Refill: 6 - insulin aspart (novoLOG) injection 8 Units - POCT glucose (manual entry)  2. Gastroesophageal reflux disease without esophagitis Stable - omeprazole (PRILOSEC) 40 MG capsule; TAKE 1 CAPSULE(40 MG) BY MOUTH DAILY  Dispense: 90 capsule; Refill: 1  3. Hypogonadism male He has been out of testosterone as this was not covered by insurance previously - Testosterone (ANDROGEL) 20.25 MG/1.25GM (1.62%) GEL; Apply 20.25 mg topically every morning. Apply topically under arm pit alternating with each application.  Dispense: 1.25 g; Refill: 3  4. Degenerative disc disease, lumbar Uncontrolled Currently on tramadol Followed by spine surgeon - Drug Screen 12+Alcohol+CRT, Ur - traMADol (ULTRAM) 50 MG tablet; Take 1 tablet (50 mg total) by mouth at  bedtime as needed (pain).  Dispense: 30 tablet; Refill: 1 - Drug Screen 11 w/Conf, Ser  5. Essential hypertension Controlled Counseled on blood pressure goal of less than 130/80, low-sodium, DASH diet, medication compliance, 150 minutes of moderate intensity exercise per week. Discussed medication compliance, adverse effects.  6. Schizoaffective disorder, bipolar type ([place]) Stable Management as per psych     Meds ordered this encounter  Medications  . insulin detemir (LEVEMIR FLEXTOUCH) 100 UNIT/ML FlexPen    Sig: Inject 43 Units into the skin 2 (two) times daily.    Dispense:  6 pen    Refill:  6    Dose change  . omeprazole (PRILOSEC) 40 MG capsule    Sig: TAKE 1 CAPSULE(40 MG) BY MOUTH DAILY    Dispense:  90 capsule    Refill:  1  . Testosterone (ANDROGEL) 20.25 MG/1.25GM (1.62%) GEL    Sig: Apply 20.25 mg topically every morning. Apply topically under arm pit alternating with each application.    Dispense:  1.25 g    Refill:  3    Dispense Pump  . insulin aspart (novoLOG) injection 8 Units  . traMADol (ULTRAM) 50 MG tablet    Sig: Take 1 tablet (50 mg total) by mouth at bedtime as needed (pain).     Dispense:  30 tablet    Refill:  1    Dx; Degenerative Disc Disease    Follow-up: Return in about 2 weeks (around 11/15/2019) for Follow-up of blood pressure.       [name], MD, FAAFP. [facility] and [place], [place]   11/02/2019, 6:03 PM

## 2019-11-02 DIAGNOSIS — F250 Schizoaffective disorder, bipolar type: Secondary | ICD-10-CM | POA: Diagnosis not present

## 2019-11-04 ENCOUNTER — Telehealth: Payer: Self-pay

## 2019-11-04 ENCOUNTER — Encounter: Payer: Self-pay | Admitting: Family Medicine

## 2019-11-04 NOTE — Telephone Encounter (Signed)
Testosterone gel prior auth approved thru 04/20/20

## 2019-11-05 LAB — DRUG SCREEN 11 W/CONF, SE
Amphetamines, IA: NEGATIVE ng/mL
Barbiturates, IA: NEGATIVE ug/mL
Benzodiazepines, IA: NEGATIVE ng/mL
Cocaine & Metabolite, IA: NEGATIVE ng/mL
Ethyl Alcohol, Enz: NEGATIVE gm/dL
Methadone, IA: NEGATIVE ng/mL
Opiates, IA: NEGATIVE ng/mL
Oxycodones, IA: NEGATIVE ng/mL
Phencyclidine, IA: NEGATIVE ng/mL
Propoxyphene, IA: NEGATIVE ng/mL
THC(Marijuana) Metabolite, IA: NEGATIVE ng/mL

## 2019-11-07 ENCOUNTER — Other Ambulatory Visit: Payer: Self-pay | Admitting: Family Medicine

## 2019-11-07 DIAGNOSIS — S92514D Nondisplaced fracture of proximal phalanx of right lesser toe(s), subsequent encounter for fracture with routine healing: Secondary | ICD-10-CM | POA: Diagnosis not present

## 2019-11-07 DIAGNOSIS — E1122 Type 2 diabetes mellitus with diabetic chronic kidney disease: Secondary | ICD-10-CM | POA: Diagnosis not present

## 2019-11-07 DIAGNOSIS — F319 Bipolar disorder, unspecified: Secondary | ICD-10-CM | POA: Diagnosis not present

## 2019-11-07 DIAGNOSIS — F0390 Unspecified dementia without behavioral disturbance: Secondary | ICD-10-CM | POA: Diagnosis not present

## 2019-11-07 DIAGNOSIS — M5136 Other intervertebral disc degeneration, lumbar region: Secondary | ICD-10-CM

## 2019-11-07 DIAGNOSIS — F250 Schizoaffective disorder, bipolar type: Secondary | ICD-10-CM | POA: Diagnosis not present

## 2019-11-07 DIAGNOSIS — N183 Chronic kidney disease, stage 3 unspecified: Secondary | ICD-10-CM | POA: Diagnosis not present

## 2019-11-07 DIAGNOSIS — E1140 Type 2 diabetes mellitus with diabetic neuropathy, unspecified: Secondary | ICD-10-CM | POA: Diagnosis not present

## 2019-11-07 DIAGNOSIS — I129 Hypertensive chronic kidney disease with stage 1 through stage 4 chronic kidney disease, or unspecified chronic kidney disease: Secondary | ICD-10-CM | POA: Diagnosis not present

## 2019-11-07 DIAGNOSIS — G8929 Other chronic pain: Secondary | ICD-10-CM | POA: Diagnosis not present

## 2019-11-08 ENCOUNTER — Other Ambulatory Visit: Payer: Self-pay | Admitting: Neurosurgery

## 2019-11-08 DIAGNOSIS — M5416 Radiculopathy, lumbar region: Secondary | ICD-10-CM

## 2019-11-09 ENCOUNTER — Other Ambulatory Visit: Payer: Self-pay | Admitting: Family Medicine

## 2019-11-09 LAB — SPECIMEN STATUS REPORT

## 2019-11-11 DIAGNOSIS — E1165 Type 2 diabetes mellitus with hyperglycemia: Secondary | ICD-10-CM | POA: Diagnosis not present

## 2019-11-14 ENCOUNTER — Telehealth: Payer: Self-pay | Admitting: Licensed Clinical Social Worker

## 2019-11-14 ENCOUNTER — Ambulatory Visit: Payer: Medicare HMO | Attending: Family Medicine | Admitting: Licensed Clinical Social Worker

## 2019-11-14 ENCOUNTER — Other Ambulatory Visit: Payer: Self-pay

## 2019-11-14 DIAGNOSIS — I129 Hypertensive chronic kidney disease with stage 1 through stage 4 chronic kidney disease, or unspecified chronic kidney disease: Secondary | ICD-10-CM | POA: Diagnosis not present

## 2019-11-14 DIAGNOSIS — F0390 Unspecified dementia without behavioral disturbance: Secondary | ICD-10-CM | POA: Diagnosis not present

## 2019-11-14 DIAGNOSIS — N183 Chronic kidney disease, stage 3 unspecified: Secondary | ICD-10-CM | POA: Diagnosis not present

## 2019-11-14 DIAGNOSIS — F315 Bipolar disorder, current episode depressed, severe, with psychotic features: Secondary | ICD-10-CM | POA: Diagnosis not present

## 2019-11-14 DIAGNOSIS — F319 Bipolar disorder, unspecified: Secondary | ICD-10-CM | POA: Diagnosis not present

## 2019-11-14 DIAGNOSIS — E1140 Type 2 diabetes mellitus with diabetic neuropathy, unspecified: Secondary | ICD-10-CM | POA: Diagnosis not present

## 2019-11-14 DIAGNOSIS — S92514D Nondisplaced fracture of proximal phalanx of right lesser toe(s), subsequent encounter for fracture with routine healing: Secondary | ICD-10-CM | POA: Diagnosis not present

## 2019-11-14 DIAGNOSIS — F250 Schizoaffective disorder, bipolar type: Secondary | ICD-10-CM | POA: Diagnosis not present

## 2019-11-14 DIAGNOSIS — G8929 Other chronic pain: Secondary | ICD-10-CM | POA: Diagnosis not present

## 2019-11-14 DIAGNOSIS — E1122 Type 2 diabetes mellitus with diabetic chronic kidney disease: Secondary | ICD-10-CM | POA: Diagnosis not present

## 2019-11-14 NOTE — Telephone Encounter (Signed)
Call placed to patient regarding [facility] appointment. LCSW left message requesting a return call.  °

## 2019-11-15 DIAGNOSIS — F250 Schizoaffective disorder, bipolar type: Secondary | ICD-10-CM | POA: Diagnosis not present

## 2019-11-21 ENCOUNTER — Encounter: Payer: Self-pay | Admitting: Family Medicine

## 2019-11-21 ENCOUNTER — Ambulatory Visit: Payer: Medicare HMO | Attending: Family Medicine | Admitting: Family Medicine

## 2019-11-21 ENCOUNTER — Other Ambulatory Visit: Payer: Self-pay

## 2019-11-21 DIAGNOSIS — F250 Schizoaffective disorder, bipolar type: Secondary | ICD-10-CM | POA: Diagnosis not present

## 2019-11-21 DIAGNOSIS — I10 Essential (primary) hypertension: Secondary | ICD-10-CM

## 2019-11-21 DIAGNOSIS — N1831 Chronic kidney disease, stage 3a: Secondary | ICD-10-CM

## 2019-11-21 DIAGNOSIS — Z794 Long term (current) use of insulin: Secondary | ICD-10-CM

## 2019-11-21 DIAGNOSIS — E1121 Type 2 diabetes mellitus with diabetic nephropathy: Secondary | ICD-10-CM | POA: Diagnosis not present

## 2019-11-21 DIAGNOSIS — E1122 Type 2 diabetes mellitus with diabetic chronic kidney disease: Secondary | ICD-10-CM | POA: Diagnosis not present

## 2019-11-21 DIAGNOSIS — F319 Bipolar disorder, unspecified: Secondary | ICD-10-CM | POA: Diagnosis not present

## 2019-11-21 DIAGNOSIS — I129 Hypertensive chronic kidney disease with stage 1 through stage 4 chronic kidney disease, or unspecified chronic kidney disease: Secondary | ICD-10-CM | POA: Diagnosis not present

## 2019-11-21 DIAGNOSIS — S92514D Nondisplaced fracture of proximal phalanx of right lesser toe(s), subsequent encounter for fracture with routine healing: Secondary | ICD-10-CM | POA: Diagnosis not present

## 2019-11-21 DIAGNOSIS — G8929 Other chronic pain: Secondary | ICD-10-CM | POA: Diagnosis not present

## 2019-11-21 DIAGNOSIS — N183 Chronic kidney disease, stage 3 unspecified: Secondary | ICD-10-CM | POA: Diagnosis not present

## 2019-11-21 DIAGNOSIS — E1140 Type 2 diabetes mellitus with diabetic neuropathy, unspecified: Secondary | ICD-10-CM | POA: Diagnosis not present

## 2019-11-21 DIAGNOSIS — F0390 Unspecified dementia without behavioral disturbance: Secondary | ICD-10-CM | POA: Diagnosis not present

## 2019-11-21 MED ORDER — LEVEMIR FLEXTOUCH 100 UNIT/ML ~~LOC~~ SOPN: 45.0000 [IU] | PEN_INJECTOR | Freq: Two times a day (BID) | SUBCUTANEOUS | 6 refills | Status: DC

## 2019-11-21 NOTE — Progress Notes (Signed)
Virtual Visit via Telephone Note  I connected with [name], on 11/21/2019 at 10:48 AM by telephone due to the COVID-19 pandemic and verified that I am speaking with the correct person using two identifiers.   Consent: I discussed the limitations, risks, security and privacy concerns of performing an evaluation and management service by telephone and the availability of in person appointments. I also discussed with the patient that there may be a patient responsible charge related to this service. The patient expressed understanding and agreed to proceed.   Location of Patient: Home  Location of Provider: Clinic   Persons participating in Telemedicine visit: Abishai Viegas [place]-CMA Dr. 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   At his last visit his BP was in the hypotensive range at 95/62 and he was asymptomatic. At home BP has been 134/79, 141/79, 126/79, 131/70 and he is compliant with his antihypertensive. Levemir dose had also been increased due to hyperglycemia and he admits to taking 43 units of Levemir. Blood sugars have been 143, 259, 211 and he is unsure if this is a random blood sugars or fasting blood sugars.  Past Medical History:  Diagnosis Date  . ADD (attention deficit disorder)   . Anxiety   . Arthritis    right hip  . Bipolar 1 disorder ([place])   . Blood in urine   . CKD (chronic kidney disease), stage III   . Creatinine elevation   . Dementia ([place])    "early onset" (08/04/2017)  . Depression    bipolar [facility]  . Diabetes mellitus without complication ([place])   . Family history of anesthesia complication    pt is unsure , but pt father may have been difficult to arouse   . HCAP  (healthcare-associated pneumonia) 10/31/2012  . History of kidney stones   . Hypertension   . Hypogonadism male   . Liver fatty degeneration   . Microscopic hematuria    hereditary s/p Urology eval  . Neuromuscular disorder ([place])    feet neuropathy   . Osteoarthritis of right hip 11/28/2011   2012 2015 s/p THR Severe  Dr Novella Olive    . Pleural effusion 11/02/2012  . Pneumonia 10-2012  . Pneumonia, organism unspecified(486) 11/02/2012  . Polysubstance dependence, non-opioid, in remission ([place])    remote  . Primary osteoarthritis of left hip 05/22/2015  . PTSD (post-traumatic stress disorder)    SOCIAL ANXIETY DISORDER   . Substance abuse ([place])   . Suicide attempt by multiple drug overdose ([place]) January 22, 2016   Grieving his cat's death 07-05-2015   Allergies  Allergen Reactions  . Vicodin [Hydrocodone-Acetaminophen] Itching    Current Outpatient Medications on File Prior to Visit  Medication Sig Dispense Refill  . acetaminophen (TYLENOL) 325 MG tablet Take 2 tablets (650 mg total) by mouth every 6 (six) hours as needed for mild pain (or Fever >/= 101).    . ARIPiprazole (ABILIFY) 5 MG tablet TAKE 1 TABLET BY MOUTH DAILY FOR DEPRESSION (Patient taking differently: Take 5 mg by mouth daily. ) 30 tablet 0  . atorvastatin (LIPITOR) 10 MG tablet Take 1 tablet (10 mg total) by mouth at bedtime. 90 tablet 1  . B-D UF III MINI PEN NEEDLES 31G X 5 MM MISC  USE FOUR TIMES DAILY 100 each 12  . Blood Glucose Monitoring Suppl (ACCU-CHEK GUIDE ME) w/Device KIT Use to check blood sugar TID. E11.21 1 kit 0  . buPROPion (WELLBUTRIN XL) 300 MG 24 hr tablet Take 1 tablet (300 mg total) by mouth daily. 30 tablet 0  . busPIRone (BUSPAR) 15 MG tablet Take 15 mg by mouth 2 (two) times daily.     . clonazePAM (KLONOPIN) 1 MG tablet Take 1 mg by mouth daily.     . Continuous Blood Gluc Receiver (FREESTYLE LIBRE 14 DAY READER) DEVI Use as directed 1 each 4  . Continuous Blood Gluc Sensor (FREESTYLE LIBRE 14 DAY SENSOR) MISC  Use as directed 2 each 4  . diclofenac sodium (VOLTAREN) 1 % GEL Apply 4 g topically 4 (four) times daily. 100 g 1  . feeding supplement, ENSURE ENLIVE, (ENSURE ENLIVE) LIQD Take 237 mLs by mouth 2 (two) times daily between meals. 237 mL 12  . fluticasone (FLONASE) 50 MCG/ACT nasal spray Place 1 spray into both nostrils daily. SHAKE LIQUID AND USE 2 SPRAYS IN EACH NOSTRIL DAILY 48 g 0  . gabapentin (NEURONTIN) 300 MG capsule Take 1 capsule (300 mg total) by mouth 2 (two) times daily. 180 capsule 1  . glucose blood (ACCU-CHEK GUIDE) test strip CHECK SUGAR FOUR TIMES DAILY 100 each 12  . insulin aspart (NOVOLOG FLEXPEN) 100 UNIT/ML FlexPen Inject 0-12 Units into the skin 2 (two) times daily. per sliding scale 6 pen 3  . insulin detemir (LEVEMIR FLEXTOUCH) 100 UNIT/ML FlexPen Inject 43 Units into the skin 2 (two) times daily. 6 pen 6  . lidocaine (LIDODERM) 5 % Place 1 patch onto the skin daily. Remove & Discard patch within 12 hours or as directed by MD 30 patch 0  . linagliptin (TRADJENTA) 5 MG TABS tablet Take 1 tablet (5 mg total) by mouth daily. 90 tablet 1  . lisinopril-hydrochlorothiazide (ZESTORETIC) 10-12.5 MG tablet Take 1 tablet by mouth daily. 90 tablet 1  . methocarbamol (ROBAXIN) 500 MG tablet Take 2 tablets (1,000 mg total) by mouth 2 (two) times daily. 120 tablet 3  . midodrine (PROAMATINE) 2.5 MG tablet Take 2.5 mg by mouth 2 (two) times daily.    Marland Kitchen NARCAN 4 MG/0.1ML LIQD nasal spray kit CALL 911. ADMINISTER A SINGLE SPRAY OF NARCAN IN ONE NOSTRIL. REPEAT EVERY 3 MINUTES AS NEEDED IF NO OR MINIMAL RESPONSE.    Marland Kitchen omeprazole (PRILOSEC) 40 MG capsule TAKE 1 CAPSULE(40 MG) BY MOUTH DAILY 90 capsule 1  . QUEtiapine (SEROQUEL) 400 MG tablet Take 1 tablet (400 mg total) by mouth at bedtime. 30 tablet 11  . tadalafil (CIALIS) 5 MG tablet TAKE ONE TABLET BY MOUTH EVERY DAY FOR BPH 90 tablet 0  . Testosterone (ANDROGEL) 20.25 MG/1.25GM (1.62%) GEL Apply 20.25 mg topically every morning. Apply  topically under arm pit alternating with each application. 1.25 g 3  . traMADol (ULTRAM) 50 MG tablet Take 1 tablet (50 mg total) by mouth at bedtime as needed (pain). 30 tablet 1  . traZODone (DESYREL) 150 MG tablet Take 1 tablet (150 mg total) by mouth at bedtime. 90 tablet 0  . divalproex (DEPAKOTE) 500 MG DR tablet Take 3 tablets (1,500 mg total) by mouth at bedtime. 90 tablet 0   No current facility-administered medications on file prior to visit.    Observations/Objective: Awake, alert, oriented x3 Not in acute distress  Lab Results  Component Value Date   HGBA1C 9.0 (A) 11/01/2019  Assessment and Plan: 1. Type 2 diabetes mellitus with stage 3a chronic kidney disease, with long-term current use of insulin (HCC) Uncontrolled with A1c of 9.0 Sugar logs are still elevated We will increase from 43 units to 45 units twice daily Counseled on Diabetic diet, my plate method, 119 minutes of moderate intensity exercise/week Blood sugar logs with fasting goals of 80-120 mg/dl, random of less than 180 and in the event of sugars less than 60 mg/dl or greater than 400 mg/dl encouraged to notify the clinic. Advised on the need for annual eye exams, annual foot exams, Pneumonia vaccine. - insulin detemir (LEVEMIR FLEXTOUCH) 100 UNIT/ML FlexPen; Inject 45 Units into the skin 2 (two) times daily.  Dispense: 6 pen; Refill: 6  2. Essential hypertension Previous hypotension has resolved Will not make regimen changes at this time but encouraged to comply with his current regimen Counseled on blood pressure goal of less than 130/80, low-sodium, DASH diet, medication compliance, 150 minutes of moderate intensity exercise per week. Discussed medication compliance, adverse effects.    Follow Up Instructions: 3 months   I discussed the assessment and treatment plan with the patient. The patient was provided an opportunity to ask questions and all were answered. The patient agreed with the plan and  demonstrated an understanding of the instructions.   The patient was advised to call back or seek an in-person evaluation if the symptoms worsen or if the condition fails to improve as anticipated.     I provided 11 minutes total of non-face-to-face time during this encounter including median intraservice time, reviewing previous notes, investigations, ordering medications, medical decision making, coordinating care and patient verbalized understanding at the end of the visit.     [name], MD, FAAFP. [facility] and [facility] [place], [place]   11/21/2019, 10:48 AM

## 2019-11-21 NOTE — Progress Notes (Signed)
Having pain in upper and lower back.

## 2019-11-22 ENCOUNTER — Telehealth: Payer: Self-pay | Admitting: Family Medicine

## 2019-11-22 NOTE — Telephone Encounter (Signed)
Called to request a verbal for PT extension for 1xwk 6, and refer patient to OT for evaluation,  Any questions, please call at [phone]

## 2019-11-23 DIAGNOSIS — F0390 Unspecified dementia without behavioral disturbance: Secondary | ICD-10-CM | POA: Diagnosis not present

## 2019-11-23 DIAGNOSIS — F250 Schizoaffective disorder, bipolar type: Secondary | ICD-10-CM | POA: Diagnosis not present

## 2019-11-23 DIAGNOSIS — E1122 Type 2 diabetes mellitus with diabetic chronic kidney disease: Secondary | ICD-10-CM | POA: Diagnosis not present

## 2019-11-23 DIAGNOSIS — E1140 Type 2 diabetes mellitus with diabetic neuropathy, unspecified: Secondary | ICD-10-CM | POA: Diagnosis not present

## 2019-11-23 DIAGNOSIS — F319 Bipolar disorder, unspecified: Secondary | ICD-10-CM | POA: Diagnosis not present

## 2019-11-23 DIAGNOSIS — G8929 Other chronic pain: Secondary | ICD-10-CM | POA: Diagnosis not present

## 2019-11-23 DIAGNOSIS — N183 Chronic kidney disease, stage 3 unspecified: Secondary | ICD-10-CM | POA: Diagnosis not present

## 2019-11-23 DIAGNOSIS — S92514D Nondisplaced fracture of proximal phalanx of right lesser toe(s), subsequent encounter for fracture with routine healing: Secondary | ICD-10-CM | POA: Diagnosis not present

## 2019-11-23 DIAGNOSIS — I129 Hypertensive chronic kidney disease with stage 1 through stage 4 chronic kidney disease, or unspecified chronic kidney disease: Secondary | ICD-10-CM | POA: Diagnosis not present

## 2019-11-24 ENCOUNTER — Ambulatory Visit (INDEPENDENT_AMBULATORY_CARE_PROVIDER_SITE_OTHER): Payer: Medicare HMO | Admitting: Nurse Practitioner

## 2019-11-24 ENCOUNTER — Other Ambulatory Visit (INDEPENDENT_AMBULATORY_CARE_PROVIDER_SITE_OTHER): Payer: Medicare HMO

## 2019-11-24 ENCOUNTER — Encounter: Payer: Self-pay | Admitting: Nurse Practitioner

## 2019-11-24 VITALS — BP 140/80 | HR 94 | Ht 77.0 in | Wt 230.0 lb

## 2019-11-24 DIAGNOSIS — M6281 Muscle weakness (generalized): Secondary | ICD-10-CM | POA: Diagnosis not present

## 2019-11-24 DIAGNOSIS — F0390 Unspecified dementia without behavioral disturbance: Secondary | ICD-10-CM | POA: Diagnosis not present

## 2019-11-24 DIAGNOSIS — G9349 Other encephalopathy: Secondary | ICD-10-CM | POA: Diagnosis not present

## 2019-11-24 DIAGNOSIS — Z1211 Encounter for screening for malignant neoplasm of colon: Secondary | ICD-10-CM | POA: Diagnosis not present

## 2019-11-24 DIAGNOSIS — K625 Hemorrhage of anus and rectum: Secondary | ICD-10-CM

## 2019-11-24 DIAGNOSIS — D649 Anemia, unspecified: Secondary | ICD-10-CM | POA: Diagnosis not present

## 2019-11-24 DIAGNOSIS — G8929 Other chronic pain: Secondary | ICD-10-CM | POA: Diagnosis not present

## 2019-11-24 LAB — IBC + FERRITIN
Ferritin: 313 ng/mL (ref 22.0–322.0)
Iron: 68 ug/dL (ref 42–165)
Saturation Ratios: 14 % — ABNORMAL LOW (ref 20.0–50.0)
Transferrin: 347 mg/dL (ref 212.0–360.0)

## 2019-11-24 NOTE — Progress Notes (Signed)
ASSESSMENT / PLAN:    [name] is a 54 y.o. male with PMH / [place] significant for,  but not necessarily limited to:  HTN, DM, hyperlipidemia , anxiety, depression with suicide attempt, bipolar, polysubstance abuse, degenerative disease of spine, neuropathy, CKD, memory loss / dementia  # colon cancer screening --He gives a lifelong history of rectal bleeding with BMs but history may not be reliable. Hgb 11.4. Probably hemorrhoidal bleeding.  --I have concerns that patient will not be able to prep his bowels at home, especially if he lives alone, has limited mobility and a history of falls.  --Patient has some memory loss / ? Dementia.  Sister, legal guardian is not in town.  I think it is most appropriate for patient to return to clinic with his Sister. Hopefully she can give some insight into his ability to safely prep bowels or ensure [place] that he will have help. Furthermore she will need to sign the consent form for the procedure  --If patient does have a colonoscopy it should probably be done at the hospital given his limited mobility /  fall risk   # [place] anemia --Possibly due in part to CKD --Obtain iron studies.  If iron deficient then may need to plan for both EGD and colonoscopy    # thrombocytopenia, platelets 57. ---Etiology unclear, doesn't appear to have ever been evaluated.  -- Consider Hematology referral, will defer to PCP.      HPI:     Chief Complaint:  Colon cancer screening    [name] is a 54 yo male referred by PCP for colon cancer screening. He was had a previsit in preparation for colonoscopy with Dr. Fuller Plan. Previsit nurses / Dr. Fuller Plan had concerns about patient's mobility and wanted him seen in clinic. Patient has a complex medical history complicated by psychiatric illness. He is somewhat of a tangential historian. Patient wants a colonoscopy because he is 50 and scared. He gives a life long history of rectal bleeding with every BM, says it  runs in his family. Patient not sure if he is constipated. He cannot recall how many BMs a week he has but settles on about two a week. I asked about stability of his weight, he instead told me about his blood pressure but finally did say his weight was overall stable with mild fluctuations. Patient is in a wheelchair.  He says that he is unable to walk due to falls and weakness.  Patient says he would have difficulty standing up and transitioning himself to the examining table.  Sounds like he uses a rolling walker at home.  He has a history of falls.  Patient says he currently lives alone. Per patient , his sister is his legal guardian and helps with medical decision making and signs consent forms.    Data Reviewed:  Jan 2021 Hgb 3.5 Hgb 11.4 Platelets 57  May 2021 Cr 2 LFTs normal.    Past Medical History:  Diagnosis Date  . ADD (attention deficit disorder)   . Anxiety   . Arthritis    right hip  . Bipolar 1 disorder ([place])   . Blood in urine   . CKD (chronic kidney disease), stage III   . Creatinine elevation   . Dementia ([place])    "early onset" (08/04/2017)  . Depression    bipolar [facility]  . Diabetes mellitus without complication ([place])   . Family  history of anesthesia complication    pt is unsure , but pt father may have been difficult to arouse   . HCAP (healthcare-associated pneumonia) 10/31/2012  . History of kidney stones   . Hypertension   . Hypogonadism male   . Liver fatty degeneration   . Microscopic hematuria    hereditary s/p Urology eval  . Neuromuscular disorder ([place])    feet neuropathy   . Osteoarthritis of right hip 11/28/2011   2012 2015 s/p THR Severe  Dr Novella Olive    . Pleural effusion 11/02/2012  . Pneumonia 10-2012  . Pneumonia, organism unspecified(486) 11/02/2012  . Polysubstance dependence, non-opioid, in remission ([place])    remote  . Primary osteoarthritis of left hip 05/22/2015  . PTSD (post-traumatic stress disorder)    SOCIAL ANXIETY  DISORDER   . Substance abuse ([place])   . Suicide attempt by multiple drug overdose ([place]) 2016-01-11   Grieving his cat's death 2015-07-01     Past Surgical History:  Procedure Laterality Date  . BACK SURGERY    . CLOSED REDUCTION METACARPAL WITH PERCUTANEOUS PINNING Right   . LUMBAR DISC SURGERY    . TONSILLECTOMY    . TOTAL HIP ARTHROPLASTY Right 08/16/2013   Procedure: TOTAL HIP ARTHROPLASTY ANTERIOR APPROACH;  Surgeon: Hessie Dibble, MD;  Location: [place];  Service: Orthopedics;  Laterality: Right;  . TOTAL HIP ARTHROPLASTY Left 05/22/2015   Procedure: TOTAL HIP ARTHROPLASTY ANTERIOR APPROACH;  Surgeon: Melrose Nakayama, MD;  Location: [place];  Service: Orthopedics;  Laterality: Left;   Family History  Problem Relation Age of Onset  . Diabetes Father   . Cancer Mother        died of melanoma with mets  . Cervical cancer Sister   . Diabetes Sister   . Other Neg Hx        hypogonadism  . Colon cancer Neg Hx   . Colon polyps Neg Hx   . Esophageal cancer Neg Hx   . Rectal cancer Neg Hx   . Stomach cancer Neg Hx    Social History   Tobacco Use  . Smoking status: Former Smoker    Years: 0.25    Types: Cigarettes    Quit date: 03/22/2019    Years since quitting: 0.6  . Smokeless tobacco: Never Used  Vaping Use  . Vaping Use: Never used  Substance Use Topics  . Alcohol use: Not Currently  . Drug use: No    Comment: hx of marijuana/cocaine/crack use but sober since 2022-06-30   Current Outpatient Medications  Medication Sig Dispense Refill  . acetaminophen (TYLENOL) 325 MG tablet Take 2 tablets (650 mg total) by mouth every 6 (six) hours as needed for mild pain (or Fever >/= 101).    . ARIPiprazole (ABILIFY) 5 MG tablet TAKE 1 TABLET BY MOUTH DAILY FOR DEPRESSION (Patient taking differently: Take 5 mg by mouth daily. ) 30 tablet 0  . atorvastatin (LIPITOR) 10 MG tablet Take 1 tablet (10 mg total) by mouth at bedtime. 90 tablet 1  . B-D UF III MINI PEN NEEDLES 31G X 5 MM MISC USE FOUR  TIMES DAILY 100 each 12  . Blood Glucose Monitoring Suppl (ACCU-CHEK GUIDE ME) w/Device KIT Use to check blood sugar TID. E11.21 1 kit 0  . buPROPion (WELLBUTRIN XL) 300 MG 24 hr tablet Take 1 tablet (300 mg total) by mouth daily. 30 tablet 0  . busPIRone (BUSPAR) 15 MG tablet Take 15 mg by mouth 2 (two) times daily.     Marland Kitchen  clonazePAM (KLONOPIN) 1 MG tablet Take 1 mg by mouth daily.     . Continuous Blood Gluc Receiver (FREESTYLE LIBRE 14 DAY READER) DEVI Use as directed 1 each 4  . Continuous Blood Gluc Sensor (FREESTYLE LIBRE 14 DAY SENSOR) MISC Use as directed 2 each 4  . diclofenac sodium (VOLTAREN) 1 % GEL Apply 4 g topically 4 (four) times daily. 100 g 1  . feeding supplement, ENSURE ENLIVE, (ENSURE ENLIVE) LIQD Take 237 mLs by mouth 2 (two) times daily between meals. 237 mL 12  . fluticasone (FLONASE) 50 MCG/ACT nasal spray Place 1 spray into both nostrils daily. SHAKE LIQUID AND USE 2 SPRAYS IN EACH NOSTRIL DAILY 48 g 0  . gabapentin (NEURONTIN) 300 MG capsule Take 1 capsule (300 mg total) by mouth 2 (two) times daily. 180 capsule 1  . glucose blood (ACCU-CHEK GUIDE) test strip CHECK SUGAR FOUR TIMES DAILY 100 each 12  . insulin aspart (NOVOLOG FLEXPEN) 100 UNIT/ML FlexPen Inject 0-12 Units into the skin 2 (two) times daily. per sliding scale 6 pen 3  . insulin detemir (LEVEMIR FLEXTOUCH) 100 UNIT/ML FlexPen Inject 45 Units into the skin 2 (two) times daily. 6 pen 6  . lidocaine (LIDODERM) 5 % Place 1 patch onto the skin daily. Remove & Discard patch within 12 hours or as directed by MD 30 patch 0  . linagliptin (TRADJENTA) 5 MG TABS tablet Take 1 tablet (5 mg total) by mouth daily. 90 tablet 1  . lisinopril-hydrochlorothiazide (ZESTORETIC) 10-12.5 MG tablet Take 1 tablet by mouth daily. 90 tablet 1  . methocarbamol (ROBAXIN) 500 MG tablet Take 2 tablets (1,000 mg total) by mouth 2 (two) times daily. 120 tablet 3  . midodrine (PROAMATINE) 2.5 MG tablet Take 2.5 mg by mouth 2 (two) times  daily.    Marland Kitchen NARCAN 4 MG/0.1ML LIQD nasal spray kit CALL 911. ADMINISTER A SINGLE SPRAY OF NARCAN IN ONE NOSTRIL. REPEAT EVERY 3 MINUTES AS NEEDED IF NO OR MINIMAL RESPONSE.    Marland Kitchen omeprazole (PRILOSEC) 40 MG capsule TAKE 1 CAPSULE(40 MG) BY MOUTH DAILY 90 capsule 1  . QUEtiapine (SEROQUEL) 400 MG tablet Take 1 tablet (400 mg total) by mouth at bedtime. 30 tablet 11  . tadalafil (CIALIS) 5 MG tablet TAKE ONE TABLET BY MOUTH EVERY DAY FOR BPH 90 tablet 0  . Testosterone (ANDROGEL) 20.25 MG/1.25GM (1.62%) GEL Apply 20.25 mg topically every morning. Apply topically under arm pit alternating with each application. 1.25 g 3  . traMADol (ULTRAM) 50 MG tablet Take 1 tablet (50 mg total) by mouth at bedtime as needed (pain). 30 tablet 1  . traZODone (DESYREL) 150 MG tablet Take 1 tablet (150 mg total) by mouth at bedtime. 90 tablet 0  . divalproex (DEPAKOTE) 500 MG DR tablet Take 3 tablets (1,500 mg total) by mouth at bedtime. 90 tablet 0   No current facility-administered medications for this visit.   Allergies  Allergen Reactions  . Vicodin [Hydrocodone-Acetaminophen] Itching     Review of Systems: Not obtained, I feel history unreliable.    Creatinine clearance cannot be calculated (Patient's most recent lab result is older than the maximum 21 days allowed.)   Physical Exam:    Wt Readings from Last 3 Encounters:  11/24/19 230 lb (104.3 kg)  11/01/19 209 lb (94.8 kg)  10/17/19 211 lb (95.7 kg)    BP 140/80   Pulse 94   Ht 6' 5"  (1.956 m)   Wt 230 lb (104.3 kg) Comment: per pt  BMI 27.27 kg/m  Constitutional:  Thin male in no acute distress. In wheelchair Psychiatric: Flat affect. Cooperative. EENT: Pupils normal.  Conjunctivae are normal. No scleral icterus. Neck supple.  Cardiovascular: Normal rate, regular rhythm. No edema Pulmonary/chest: Effort normal and breath sounds normal. No wheezing, rales or rhonchi. Abdominal: Limited exam in wheelchair. Soft, nondistended, nontender.  Bowel sounds active throughout. Probable abdominal wall hernia with bulge in LUQ.  hepatomegaly. Neurological: Alert and oriented to person place and time. Skin: Skin is warm and dry. No rashes noted.  Tye Savoy, NP  11/24/2019, 3:03 PM  Cc:  Referring Provider [name], MD

## 2019-11-24 NOTE — Patient Instructions (Addendum)
Go to the basement for labs today  You will need to follow up with Dr Fuller Plan and bring your sister  (Legal Guardian) with you to your appointment   01/17/2020 at 8:30am  If you are age 52 or older, your body mass index should be between 23-30. Your Body mass index is 27.27 kg/m. If this is out of the aforementioned range listed, please consider follow up with your Primary Care Provider.  If you are age 41 or younger, your body mass index should be between 19-25. Your Body mass index is 27.27 kg/m. If this is out of the aformentioned range listed, please consider follow up with your Primary Care Provider.    Due to recent changes in healthcare laws, you may see the results of your imaging and laboratory studies on MyChart before your provider has had a chance to review them.  We understand that in some cases there may be results that are confusing or concerning to you. Not all laboratory results come back in the same time frame and the provider may be waiting for multiple results in order to interpret others.  Please give [place] 48 hours in order for your provider to thoroughly review all the results before contacting the office for clarification of your results.   I appreciate the  opportunity to care for you  Thank You   [place]

## 2019-11-25 DIAGNOSIS — R3121 Asymptomatic microscopic hematuria: Secondary | ICD-10-CM | POA: Diagnosis not present

## 2019-11-25 DIAGNOSIS — N5201 Erectile dysfunction due to arterial insufficiency: Secondary | ICD-10-CM | POA: Diagnosis not present

## 2019-11-25 DIAGNOSIS — R350 Frequency of micturition: Secondary | ICD-10-CM | POA: Diagnosis not present

## 2019-11-25 DIAGNOSIS — N401 Enlarged prostate with lower urinary tract symptoms: Secondary | ICD-10-CM | POA: Diagnosis not present

## 2019-11-25 NOTE — Progress Notes (Signed)
Reviewed and agree with management plan. Iron studies returned and are c/w iron deficiency so colonoscopy and EGD is recommended.  Await return office appt with his POA, his sister, as outlined.  PCP to consider hematology referral for thrombocytopenia.   Pricilla Riffle. Fuller Plan, MD [facility]

## 2019-11-25 NOTE — Telephone Encounter (Signed)
Flor was called and given verbal orders for patient as listed.

## 2019-11-28 ENCOUNTER — Ambulatory Visit: Payer: Medicare HMO | Admitting: Family Medicine

## 2019-11-28 DIAGNOSIS — S92514D Nondisplaced fracture of proximal phalanx of right lesser toe(s), subsequent encounter for fracture with routine healing: Secondary | ICD-10-CM | POA: Diagnosis not present

## 2019-11-28 DIAGNOSIS — F0390 Unspecified dementia without behavioral disturbance: Secondary | ICD-10-CM | POA: Diagnosis not present

## 2019-11-28 DIAGNOSIS — N183 Chronic kidney disease, stage 3 unspecified: Secondary | ICD-10-CM | POA: Diagnosis not present

## 2019-11-28 DIAGNOSIS — G8929 Other chronic pain: Secondary | ICD-10-CM | POA: Diagnosis not present

## 2019-11-28 DIAGNOSIS — E1140 Type 2 diabetes mellitus with diabetic neuropathy, unspecified: Secondary | ICD-10-CM | POA: Diagnosis not present

## 2019-11-28 DIAGNOSIS — F319 Bipolar disorder, unspecified: Secondary | ICD-10-CM | POA: Diagnosis not present

## 2019-11-28 DIAGNOSIS — I129 Hypertensive chronic kidney disease with stage 1 through stage 4 chronic kidney disease, or unspecified chronic kidney disease: Secondary | ICD-10-CM | POA: Diagnosis not present

## 2019-11-28 DIAGNOSIS — E1122 Type 2 diabetes mellitus with diabetic chronic kidney disease: Secondary | ICD-10-CM | POA: Diagnosis not present

## 2019-11-28 DIAGNOSIS — F250 Schizoaffective disorder, bipolar type: Secondary | ICD-10-CM | POA: Diagnosis not present

## 2019-11-30 DIAGNOSIS — F0390 Unspecified dementia without behavioral disturbance: Secondary | ICD-10-CM | POA: Diagnosis not present

## 2019-11-30 DIAGNOSIS — N183 Chronic kidney disease, stage 3 unspecified: Secondary | ICD-10-CM | POA: Diagnosis not present

## 2019-11-30 DIAGNOSIS — F319 Bipolar disorder, unspecified: Secondary | ICD-10-CM | POA: Diagnosis not present

## 2019-11-30 DIAGNOSIS — S92514D Nondisplaced fracture of proximal phalanx of right lesser toe(s), subsequent encounter for fracture with routine healing: Secondary | ICD-10-CM | POA: Diagnosis not present

## 2019-11-30 DIAGNOSIS — E1140 Type 2 diabetes mellitus with diabetic neuropathy, unspecified: Secondary | ICD-10-CM | POA: Diagnosis not present

## 2019-11-30 DIAGNOSIS — G8929 Other chronic pain: Secondary | ICD-10-CM | POA: Diagnosis not present

## 2019-11-30 DIAGNOSIS — I129 Hypertensive chronic kidney disease with stage 1 through stage 4 chronic kidney disease, or unspecified chronic kidney disease: Secondary | ICD-10-CM | POA: Diagnosis not present

## 2019-11-30 DIAGNOSIS — E1122 Type 2 diabetes mellitus with diabetic chronic kidney disease: Secondary | ICD-10-CM | POA: Diagnosis not present

## 2019-11-30 DIAGNOSIS — F250 Schizoaffective disorder, bipolar type: Secondary | ICD-10-CM | POA: Diagnosis not present

## 2019-12-01 ENCOUNTER — Ambulatory Visit
Admission: RE | Admit: 2019-12-01 | Discharge: 2019-12-01 | Disposition: A | Discharge status: Home / Self Care | Payer: Medicare HMO | Source: Ambulatory Visit | Attending: Neurosurgery | Admitting: Neurosurgery

## 2019-12-01 ENCOUNTER — Other Ambulatory Visit: Payer: Self-pay

## 2019-12-01 DIAGNOSIS — M5416 Radiculopathy, lumbar region: Secondary | ICD-10-CM

## 2019-12-01 DIAGNOSIS — M5126 Other intervertebral disc displacement, lumbar region: Secondary | ICD-10-CM | POA: Diagnosis not present

## 2019-12-01 DIAGNOSIS — M4326 Fusion of spine, lumbar region: Secondary | ICD-10-CM | POA: Diagnosis not present

## 2019-12-01 DIAGNOSIS — M5136 Other intervertebral disc degeneration, lumbar region: Secondary | ICD-10-CM | POA: Diagnosis not present

## 2019-12-01 DIAGNOSIS — M48061 Spinal stenosis, lumbar region without neurogenic claudication: Secondary | ICD-10-CM | POA: Diagnosis not present

## 2019-12-05 DIAGNOSIS — N1832 Chronic kidney disease, stage 3b: Secondary | ICD-10-CM | POA: Diagnosis not present

## 2019-12-05 DIAGNOSIS — E1122 Type 2 diabetes mellitus with diabetic chronic kidney disease: Secondary | ICD-10-CM | POA: Diagnosis not present

## 2019-12-05 DIAGNOSIS — S92514D Nondisplaced fracture of proximal phalanx of right lesser toe(s), subsequent encounter for fracture with routine healing: Secondary | ICD-10-CM | POA: Diagnosis not present

## 2019-12-05 DIAGNOSIS — N400 Enlarged prostate without lower urinary tract symptoms: Secondary | ICD-10-CM | POA: Diagnosis not present

## 2019-12-05 DIAGNOSIS — E1140 Type 2 diabetes mellitus with diabetic neuropathy, unspecified: Secondary | ICD-10-CM | POA: Diagnosis not present

## 2019-12-05 DIAGNOSIS — D631 Anemia in chronic kidney disease: Secondary | ICD-10-CM | POA: Diagnosis not present

## 2019-12-05 DIAGNOSIS — I129 Hypertensive chronic kidney disease with stage 1 through stage 4 chronic kidney disease, or unspecified chronic kidney disease: Secondary | ICD-10-CM | POA: Diagnosis not present

## 2019-12-05 DIAGNOSIS — N183 Chronic kidney disease, stage 3 unspecified: Secondary | ICD-10-CM | POA: Diagnosis not present

## 2019-12-05 DIAGNOSIS — N189 Chronic kidney disease, unspecified: Secondary | ICD-10-CM | POA: Diagnosis not present

## 2019-12-05 DIAGNOSIS — F319 Bipolar disorder, unspecified: Secondary | ICD-10-CM | POA: Diagnosis not present

## 2019-12-05 DIAGNOSIS — G8929 Other chronic pain: Secondary | ICD-10-CM | POA: Diagnosis not present

## 2019-12-05 DIAGNOSIS — F250 Schizoaffective disorder, bipolar type: Secondary | ICD-10-CM | POA: Diagnosis not present

## 2019-12-05 DIAGNOSIS — F0390 Unspecified dementia without behavioral disturbance: Secondary | ICD-10-CM | POA: Diagnosis not present

## 2019-12-07 DIAGNOSIS — M47816 Spondylosis without myelopathy or radiculopathy, lumbar region: Secondary | ICD-10-CM | POA: Diagnosis not present

## 2019-12-07 DIAGNOSIS — M5136 Other intervertebral disc degeneration, lumbar region: Secondary | ICD-10-CM | POA: Diagnosis not present

## 2019-12-07 DIAGNOSIS — M5416 Radiculopathy, lumbar region: Secondary | ICD-10-CM | POA: Diagnosis not present

## 2019-12-07 DIAGNOSIS — M545 Low back pain: Secondary | ICD-10-CM | POA: Diagnosis not present

## 2019-12-12 DIAGNOSIS — E1122 Type 2 diabetes mellitus with diabetic chronic kidney disease: Secondary | ICD-10-CM | POA: Diagnosis not present

## 2019-12-12 DIAGNOSIS — S92514D Nondisplaced fracture of proximal phalanx of right lesser toe(s), subsequent encounter for fracture with routine healing: Secondary | ICD-10-CM | POA: Diagnosis not present

## 2019-12-12 DIAGNOSIS — F250 Schizoaffective disorder, bipolar type: Secondary | ICD-10-CM | POA: Diagnosis not present

## 2019-12-12 DIAGNOSIS — I129 Hypertensive chronic kidney disease with stage 1 through stage 4 chronic kidney disease, or unspecified chronic kidney disease: Secondary | ICD-10-CM | POA: Diagnosis not present

## 2019-12-12 DIAGNOSIS — F0390 Unspecified dementia without behavioral disturbance: Secondary | ICD-10-CM | POA: Diagnosis not present

## 2019-12-12 DIAGNOSIS — E1140 Type 2 diabetes mellitus with diabetic neuropathy, unspecified: Secondary | ICD-10-CM | POA: Diagnosis not present

## 2019-12-12 DIAGNOSIS — G8929 Other chronic pain: Secondary | ICD-10-CM | POA: Diagnosis not present

## 2019-12-12 DIAGNOSIS — F319 Bipolar disorder, unspecified: Secondary | ICD-10-CM | POA: Diagnosis not present

## 2019-12-12 DIAGNOSIS — N183 Chronic kidney disease, stage 3 unspecified: Secondary | ICD-10-CM | POA: Diagnosis not present

## 2019-12-15 DIAGNOSIS — F250 Schizoaffective disorder, bipolar type: Secondary | ICD-10-CM | POA: Diagnosis not present

## 2019-12-19 DIAGNOSIS — I129 Hypertensive chronic kidney disease with stage 1 through stage 4 chronic kidney disease, or unspecified chronic kidney disease: Secondary | ICD-10-CM | POA: Diagnosis not present

## 2019-12-19 DIAGNOSIS — G8929 Other chronic pain: Secondary | ICD-10-CM | POA: Diagnosis not present

## 2019-12-19 DIAGNOSIS — E1122 Type 2 diabetes mellitus with diabetic chronic kidney disease: Secondary | ICD-10-CM | POA: Diagnosis not present

## 2019-12-19 DIAGNOSIS — F319 Bipolar disorder, unspecified: Secondary | ICD-10-CM | POA: Diagnosis not present

## 2019-12-19 DIAGNOSIS — E1140 Type 2 diabetes mellitus with diabetic neuropathy, unspecified: Secondary | ICD-10-CM | POA: Diagnosis not present

## 2019-12-19 DIAGNOSIS — N183 Chronic kidney disease, stage 3 unspecified: Secondary | ICD-10-CM | POA: Diagnosis not present

## 2019-12-19 DIAGNOSIS — F0390 Unspecified dementia without behavioral disturbance: Secondary | ICD-10-CM | POA: Diagnosis not present

## 2019-12-19 DIAGNOSIS — F250 Schizoaffective disorder, bipolar type: Secondary | ICD-10-CM | POA: Diagnosis not present

## 2019-12-19 DIAGNOSIS — S92514D Nondisplaced fracture of proximal phalanx of right lesser toe(s), subsequent encounter for fracture with routine healing: Secondary | ICD-10-CM | POA: Diagnosis not present

## 2019-12-22 ENCOUNTER — Ambulatory Visit (HOSPITAL_COMMUNITY): Payer: Self-pay | Admitting: Psychiatry

## 2019-12-23 DIAGNOSIS — I129 Hypertensive chronic kidney disease with stage 1 through stage 4 chronic kidney disease, or unspecified chronic kidney disease: Secondary | ICD-10-CM | POA: Diagnosis not present

## 2019-12-23 DIAGNOSIS — F0390 Unspecified dementia without behavioral disturbance: Secondary | ICD-10-CM | POA: Diagnosis not present

## 2019-12-23 DIAGNOSIS — E1140 Type 2 diabetes mellitus with diabetic neuropathy, unspecified: Secondary | ICD-10-CM | POA: Diagnosis not present

## 2019-12-23 DIAGNOSIS — G8929 Other chronic pain: Secondary | ICD-10-CM | POA: Diagnosis not present

## 2019-12-23 DIAGNOSIS — S92514D Nondisplaced fracture of proximal phalanx of right lesser toe(s), subsequent encounter for fracture with routine healing: Secondary | ICD-10-CM | POA: Diagnosis not present

## 2019-12-23 DIAGNOSIS — E1122 Type 2 diabetes mellitus with diabetic chronic kidney disease: Secondary | ICD-10-CM | POA: Diagnosis not present

## 2019-12-23 DIAGNOSIS — F250 Schizoaffective disorder, bipolar type: Secondary | ICD-10-CM | POA: Diagnosis not present

## 2019-12-23 DIAGNOSIS — N183 Chronic kidney disease, stage 3 unspecified: Secondary | ICD-10-CM | POA: Diagnosis not present

## 2019-12-23 DIAGNOSIS — F319 Bipolar disorder, unspecified: Secondary | ICD-10-CM | POA: Diagnosis not present

## 2019-12-25 DIAGNOSIS — G9349 Other encephalopathy: Secondary | ICD-10-CM | POA: Diagnosis not present

## 2019-12-25 DIAGNOSIS — M6281 Muscle weakness (generalized): Secondary | ICD-10-CM | POA: Diagnosis not present

## 2019-12-25 DIAGNOSIS — G8929 Other chronic pain: Secondary | ICD-10-CM | POA: Diagnosis not present

## 2019-12-25 DIAGNOSIS — F0390 Unspecified dementia without behavioral disturbance: Secondary | ICD-10-CM | POA: Diagnosis not present

## 2019-12-28 DIAGNOSIS — E1140 Type 2 diabetes mellitus with diabetic neuropathy, unspecified: Secondary | ICD-10-CM | POA: Diagnosis not present

## 2019-12-28 DIAGNOSIS — G8929 Other chronic pain: Secondary | ICD-10-CM | POA: Diagnosis not present

## 2019-12-28 DIAGNOSIS — F0390 Unspecified dementia without behavioral disturbance: Secondary | ICD-10-CM | POA: Diagnosis not present

## 2019-12-28 DIAGNOSIS — N183 Chronic kidney disease, stage 3 unspecified: Secondary | ICD-10-CM | POA: Diagnosis not present

## 2019-12-28 DIAGNOSIS — I129 Hypertensive chronic kidney disease with stage 1 through stage 4 chronic kidney disease, or unspecified chronic kidney disease: Secondary | ICD-10-CM | POA: Diagnosis not present

## 2019-12-28 DIAGNOSIS — F319 Bipolar disorder, unspecified: Secondary | ICD-10-CM | POA: Diagnosis not present

## 2019-12-28 DIAGNOSIS — S92514D Nondisplaced fracture of proximal phalanx of right lesser toe(s), subsequent encounter for fracture with routine healing: Secondary | ICD-10-CM | POA: Diagnosis not present

## 2019-12-28 DIAGNOSIS — E1122 Type 2 diabetes mellitus with diabetic chronic kidney disease: Secondary | ICD-10-CM | POA: Diagnosis not present

## 2019-12-28 DIAGNOSIS — F250 Schizoaffective disorder, bipolar type: Secondary | ICD-10-CM | POA: Diagnosis not present

## 2019-12-30 DIAGNOSIS — E1140 Type 2 diabetes mellitus with diabetic neuropathy, unspecified: Secondary | ICD-10-CM | POA: Diagnosis not present

## 2019-12-30 DIAGNOSIS — S92514D Nondisplaced fracture of proximal phalanx of right lesser toe(s), subsequent encounter for fracture with routine healing: Secondary | ICD-10-CM | POA: Diagnosis not present

## 2019-12-30 DIAGNOSIS — F250 Schizoaffective disorder, bipolar type: Secondary | ICD-10-CM | POA: Diagnosis not present

## 2019-12-30 DIAGNOSIS — F0390 Unspecified dementia without behavioral disturbance: Secondary | ICD-10-CM | POA: Diagnosis not present

## 2019-12-30 DIAGNOSIS — I129 Hypertensive chronic kidney disease with stage 1 through stage 4 chronic kidney disease, or unspecified chronic kidney disease: Secondary | ICD-10-CM | POA: Diagnosis not present

## 2019-12-30 DIAGNOSIS — G8929 Other chronic pain: Secondary | ICD-10-CM | POA: Diagnosis not present

## 2019-12-30 DIAGNOSIS — F319 Bipolar disorder, unspecified: Secondary | ICD-10-CM | POA: Diagnosis not present

## 2019-12-30 DIAGNOSIS — E1122 Type 2 diabetes mellitus with diabetic chronic kidney disease: Secondary | ICD-10-CM | POA: Diagnosis not present

## 2019-12-30 DIAGNOSIS — N183 Chronic kidney disease, stage 3 unspecified: Secondary | ICD-10-CM | POA: Diagnosis not present

## 2020-01-01 ENCOUNTER — Inpatient Hospital Stay (HOSPITAL_COMMUNITY)
Admission: EM | Admit: 2020-01-01 | Discharge: 2020-01-05 | DRG: 565 | Disposition: A | Discharge status: Skilled Nursing Facility | Payer: Medicare HMO | Attending: Family Medicine | Admitting: Family Medicine

## 2020-01-01 ENCOUNTER — Emergency Department (HOSPITAL_COMMUNITY): Payer: Medicare HMO

## 2020-01-01 ENCOUNTER — Other Ambulatory Visit: Payer: Self-pay

## 2020-01-01 ENCOUNTER — Encounter (HOSPITAL_COMMUNITY): Payer: Self-pay | Admitting: Emergency Medicine

## 2020-01-01 DIAGNOSIS — E1122 Type 2 diabetes mellitus with diabetic chronic kidney disease: Secondary | ICD-10-CM | POA: Diagnosis present

## 2020-01-01 DIAGNOSIS — I10 Essential (primary) hypertension: Secondary | ICD-10-CM | POA: Diagnosis not present

## 2020-01-01 DIAGNOSIS — R58 Hemorrhage, not elsewhere classified: Secondary | ICD-10-CM | POA: Diagnosis not present

## 2020-01-01 DIAGNOSIS — I129 Hypertensive chronic kidney disease with stage 1 through stage 4 chronic kidney disease, or unspecified chronic kidney disease: Secondary | ICD-10-CM | POA: Diagnosis not present

## 2020-01-01 DIAGNOSIS — F4310 Post-traumatic stress disorder, unspecified: Secondary | ICD-10-CM | POA: Diagnosis present

## 2020-01-01 DIAGNOSIS — F259 Schizoaffective disorder, unspecified: Secondary | ICD-10-CM | POA: Diagnosis not present

## 2020-01-01 DIAGNOSIS — R251 Tremor, unspecified: Secondary | ICD-10-CM | POA: Diagnosis present

## 2020-01-01 DIAGNOSIS — R4182 Altered mental status, unspecified: Secondary | ICD-10-CM | POA: Diagnosis not present

## 2020-01-01 DIAGNOSIS — F413 Other mixed anxiety disorders: Secondary | ICD-10-CM | POA: Diagnosis not present

## 2020-01-01 DIAGNOSIS — Z885 Allergy status to narcotic agent status: Secondary | ICD-10-CM

## 2020-01-01 DIAGNOSIS — Z20822 Contact with and (suspected) exposure to covid-19: Secondary | ICD-10-CM | POA: Diagnosis not present

## 2020-01-01 DIAGNOSIS — F339 Major depressive disorder, recurrent, unspecified: Secondary | ICD-10-CM | POA: Diagnosis not present

## 2020-01-01 DIAGNOSIS — S199XXA Unspecified injury of neck, initial encounter: Secondary | ICD-10-CM | POA: Diagnosis not present

## 2020-01-01 DIAGNOSIS — K219 Gastro-esophageal reflux disease without esophagitis: Secondary | ICD-10-CM | POA: Diagnosis present

## 2020-01-01 DIAGNOSIS — N179 Acute kidney failure, unspecified: Secondary | ICD-10-CM | POA: Diagnosis present

## 2020-01-01 DIAGNOSIS — L89106 Pressure-induced deep tissue damage of unspecified part of back: Secondary | ICD-10-CM | POA: Diagnosis present

## 2020-01-01 DIAGNOSIS — S299XXA Unspecified injury of thorax, initial encounter: Secondary | ICD-10-CM | POA: Diagnosis not present

## 2020-01-01 DIAGNOSIS — M51369 Other intervertebral disc degeneration, lumbar region without mention of lumbar back pain or lower extremity pain: Secondary | ICD-10-CM

## 2020-01-01 DIAGNOSIS — F250 Schizoaffective disorder, bipolar type: Secondary | ICD-10-CM | POA: Diagnosis present

## 2020-01-01 DIAGNOSIS — W19XXXA Unspecified fall, initial encounter: Secondary | ICD-10-CM | POA: Diagnosis not present

## 2020-01-01 DIAGNOSIS — M6282 Rhabdomyolysis: Secondary | ICD-10-CM | POA: Diagnosis not present

## 2020-01-01 DIAGNOSIS — S0990XA Unspecified injury of head, initial encounter: Secondary | ICD-10-CM | POA: Diagnosis not present

## 2020-01-01 DIAGNOSIS — R296 Repeated falls: Secondary | ICD-10-CM | POA: Diagnosis present

## 2020-01-01 DIAGNOSIS — M792 Neuralgia and neuritis, unspecified: Secondary | ICD-10-CM | POA: Diagnosis not present

## 2020-01-01 DIAGNOSIS — Z96643 Presence of artificial hip joint, bilateral: Secondary | ICD-10-CM | POA: Diagnosis present

## 2020-01-01 DIAGNOSIS — N1832 Chronic kidney disease, stage 3b: Secondary | ICD-10-CM

## 2020-01-01 DIAGNOSIS — Z833 Family history of diabetes mellitus: Secondary | ICD-10-CM

## 2020-01-01 DIAGNOSIS — E860 Dehydration: Secondary | ICD-10-CM

## 2020-01-01 DIAGNOSIS — M4726 Other spondylosis with radiculopathy, lumbar region: Secondary | ICD-10-CM | POA: Diagnosis present

## 2020-01-01 DIAGNOSIS — G9389 Other specified disorders of brain: Secondary | ICD-10-CM | POA: Diagnosis not present

## 2020-01-01 DIAGNOSIS — E1140 Type 2 diabetes mellitus with diabetic neuropathy, unspecified: Secondary | ICD-10-CM | POA: Diagnosis present

## 2020-01-01 DIAGNOSIS — Z79890 Hormone replacement therapy (postmenopausal): Secondary | ICD-10-CM

## 2020-01-01 DIAGNOSIS — F411 Generalized anxiety disorder: Secondary | ICD-10-CM | POA: Diagnosis not present

## 2020-01-01 DIAGNOSIS — M4802 Spinal stenosis, cervical region: Secondary | ICD-10-CM | POA: Diagnosis not present

## 2020-01-01 DIAGNOSIS — N400 Enlarged prostate without lower urinary tract symptoms: Secondary | ICD-10-CM | POA: Diagnosis present

## 2020-01-01 DIAGNOSIS — G319 Degenerative disease of nervous system, unspecified: Secondary | ICD-10-CM | POA: Diagnosis not present

## 2020-01-01 DIAGNOSIS — Z79899 Other long term (current) drug therapy: Secondary | ICD-10-CM

## 2020-01-01 DIAGNOSIS — W1811XA Fall from or off toilet without subsequent striking against object, initial encounter: Secondary | ICD-10-CM | POA: Diagnosis present

## 2020-01-01 DIAGNOSIS — R410 Disorientation, unspecified: Secondary | ICD-10-CM | POA: Diagnosis not present

## 2020-01-01 DIAGNOSIS — D6959 Other secondary thrombocytopenia: Secondary | ICD-10-CM | POA: Diagnosis present

## 2020-01-01 DIAGNOSIS — G4700 Insomnia, unspecified: Secondary | ICD-10-CM | POA: Diagnosis present

## 2020-01-01 DIAGNOSIS — Z9114 Patient's other noncompliance with medication regimen: Secondary | ICD-10-CM

## 2020-01-01 DIAGNOSIS — G4489 Other headache syndrome: Secondary | ICD-10-CM | POA: Diagnosis not present

## 2020-01-01 DIAGNOSIS — E785 Hyperlipidemia, unspecified: Secondary | ICD-10-CM | POA: Diagnosis present

## 2020-01-01 DIAGNOSIS — R531 Weakness: Secondary | ICD-10-CM | POA: Diagnosis not present

## 2020-01-01 DIAGNOSIS — G3184 Mild cognitive impairment, so stated: Secondary | ICD-10-CM | POA: Diagnosis present

## 2020-01-01 DIAGNOSIS — Z79891 Long term (current) use of opiate analgesic: Secondary | ICD-10-CM

## 2020-01-01 DIAGNOSIS — M2550 Pain in unspecified joint: Secondary | ICD-10-CM | POA: Diagnosis not present

## 2020-01-01 DIAGNOSIS — M47812 Spondylosis without myelopathy or radiculopathy, cervical region: Secondary | ICD-10-CM | POA: Diagnosis not present

## 2020-01-01 DIAGNOSIS — T796XXA Traumatic ischemia of muscle, initial encounter: Principal | ICD-10-CM

## 2020-01-01 DIAGNOSIS — R739 Hyperglycemia, unspecified: Secondary | ICD-10-CM

## 2020-01-01 DIAGNOSIS — F909 Attention-deficit hyperactivity disorder, unspecified type: Secondary | ICD-10-CM | POA: Diagnosis not present

## 2020-01-01 DIAGNOSIS — Z7401 Bed confinement status: Secondary | ICD-10-CM | POA: Diagnosis not present

## 2020-01-01 DIAGNOSIS — R000 Tachycardia, unspecified: Secondary | ICD-10-CM | POA: Diagnosis not present

## 2020-01-01 DIAGNOSIS — Z794 Long term (current) use of insulin: Secondary | ICD-10-CM

## 2020-01-01 DIAGNOSIS — R488 Other symbolic dysfunctions: Secondary | ICD-10-CM | POA: Diagnosis not present

## 2020-01-01 DIAGNOSIS — L8990 Pressure ulcer of unspecified site, unspecified stage: Secondary | ICD-10-CM | POA: Insufficient documentation

## 2020-01-01 DIAGNOSIS — M2578 Osteophyte, vertebrae: Secondary | ICD-10-CM | POA: Diagnosis not present

## 2020-01-01 DIAGNOSIS — E1165 Type 2 diabetes mellitus with hyperglycemia: Secondary | ICD-10-CM | POA: Diagnosis not present

## 2020-01-01 DIAGNOSIS — Z87891 Personal history of nicotine dependence: Secondary | ICD-10-CM

## 2020-01-01 DIAGNOSIS — N189 Chronic kidney disease, unspecified: Secondary | ICD-10-CM | POA: Diagnosis not present

## 2020-01-01 DIAGNOSIS — D696 Thrombocytopenia, unspecified: Secondary | ICD-10-CM | POA: Diagnosis not present

## 2020-01-01 DIAGNOSIS — T796XXD Traumatic ischemia of muscle, subsequent encounter: Secondary | ICD-10-CM | POA: Diagnosis not present

## 2020-01-01 DIAGNOSIS — F4010 Social phobia, unspecified: Secondary | ICD-10-CM | POA: Diagnosis present

## 2020-01-01 DIAGNOSIS — S3993XA Unspecified injury of pelvis, initial encounter: Secondary | ICD-10-CM | POA: Diagnosis not present

## 2020-01-01 DIAGNOSIS — E1340 Other specified diabetes mellitus with diabetic neuropathy, unspecified: Secondary | ICD-10-CM | POA: Diagnosis not present

## 2020-01-01 DIAGNOSIS — M5136 Other intervertebral disc degeneration, lumbar region: Secondary | ICD-10-CM

## 2020-01-01 DIAGNOSIS — Z915 Personal history of self-harm: Secondary | ICD-10-CM

## 2020-01-01 LAB — BASIC METABOLIC PANEL
Anion gap: 14 (ref 5–15)
BUN: 36 mg/dL — ABNORMAL HIGH (ref 6–20)
CO2: 23 mmol/L (ref 22–32)
Calcium: 9.8 mg/dL (ref 8.9–10.3)
Chloride: 103 mmol/L (ref 98–111)
Creatinine, Ser: 2.06 mg/dL — ABNORMAL HIGH (ref 0.61–1.24)
GFR calc Af Amer: 42 mL/min — ABNORMAL LOW (ref >60)
GFR calc non Af Amer: 36 mL/min — ABNORMAL LOW (ref >60)
Glucose, Bld: 303 mg/dL — ABNORMAL HIGH (ref 70–99)
Potassium: 5.5 mmol/L — ABNORMAL HIGH (ref 3.5–5.1)
Sodium: 140 mmol/L (ref 135–145)

## 2020-01-01 LAB — URINALYSIS, ROUTINE W REFLEX MICROSCOPIC
Bacteria, UA: NONE SEEN
Bilirubin Urine: NEGATIVE
Glucose, UA: >=500 mg/dL — AB
Ketones, ur: 20 mg/dL — AB
Leukocytes,Ua: NEGATIVE
Nitrite: NEGATIVE
Protein, ur: 100 mg/dL — AB
Specific Gravity, Urine: 1.028 (ref 1.005–1.030)
pH: 5 (ref 5.0–8.0)

## 2020-01-01 LAB — HEPATIC FUNCTION PANEL
ALT: 20 U/L (ref 0–44)
AST: 49 U/L — ABNORMAL HIGH (ref 15–41)
Albumin: 3.7 g/dL (ref 3.5–5.0)
Alkaline Phosphatase: 95 U/L (ref 38–126)
Bilirubin, Direct: 0.1 mg/dL (ref 0.0–0.2)
Indirect Bilirubin: 1 mg/dL — ABNORMAL HIGH (ref 0.3–0.9)
Total Bilirubin: 1.1 mg/dL (ref 0.3–1.2)
Total Protein: 7.4 g/dL (ref 6.5–8.1)

## 2020-01-01 LAB — CBC
HCT: 41.2 % (ref 39.0–52.0)
Hemoglobin: 13.9 g/dL (ref 13.0–17.0)
MCH: 29.4 pg (ref 26.0–34.0)
MCHC: 33.7 g/dL (ref 30.0–36.0)
MCV: 87.1 fL (ref 80.0–100.0)
Platelets: 124 10*3/uL — ABNORMAL LOW (ref 150–400)
RBC: 4.73 MIL/uL (ref 4.22–5.81)
RDW: 13.2 % (ref 11.5–15.5)
WBC: 8 10*3/uL (ref 4.0–10.5)
nRBC: 0 % (ref 0.0–0.2)

## 2020-01-01 LAB — RAPID URINE DRUG SCREEN, HOSP PERFORMED
Amphetamines: NOT DETECTED
Barbiturates: NOT DETECTED
Benzodiazepines: NOT DETECTED
Cocaine: NOT DETECTED
Opiates: NOT DETECTED
Tetrahydrocannabinol: NOT DETECTED

## 2020-01-01 LAB — ETHANOL: Alcohol, Ethyl (B): <10 mg/dL (ref <10)

## 2020-01-01 LAB — SARS CORONAVIRUS 2 BY RT PCR (HOSPITAL ORDER, PERFORMED IN ~~LOC~~ HOSPITAL LAB): SARS Coronavirus 2: NEGATIVE

## 2020-01-01 LAB — CBG MONITORING, ED
Glucose-Capillary: 294 mg/dL — ABNORMAL HIGH (ref 70–99)
Glucose-Capillary: 310 mg/dL — ABNORMAL HIGH (ref 70–99)

## 2020-01-01 LAB — CK: Total CK: 2275 U/L — ABNORMAL HIGH (ref 49–397)

## 2020-01-01 LAB — AMMONIA: Ammonia: 33 umol/L (ref 9–35)

## 2020-01-01 MED ORDER — STERILE WATER FOR INJECTION IV SOLN
INTRAVENOUS | Status: DC
  Filled 2020-01-01: qty 850

## 2020-01-01 MED ORDER — SODIUM CHLORIDE 0.9 % IV BOLUS
1000.0000 mL | Freq: Once | INTRAVENOUS | Status: AC
  Administered 2020-01-01: 1000 mL via INTRAVENOUS

## 2020-01-01 MED ORDER — INSULIN ASPART 100 UNIT/ML ~~LOC~~ SOLN
8.0000 [IU] | Freq: Once | SUBCUTANEOUS | Status: AC
  Administered 2020-01-02: 8 [IU] via INTRAVENOUS

## 2020-01-01 NOTE — ED Provider Notes (Signed)
[facility] EMERGENCY DEPARTMENT Provider Note   CSN: [id number] Arrival date & time: 01/01/20  1504     History Chief Complaint  Patient presents with  . Fall    [name] is a 54 y.o. male.  Pt presents to the ED today with AMS.  Pt has early onset dementia.  He lives alone and has an aid that comes out for 2 hrs per day.  His sister is his POA and she's been working on trying to get him into assisted living.  The sister last spoke with patient on 9/10.  She called him yesterday and did not get a response.  She called again and there was no response.  The sister went to his house and found him between the toilet and the wall sitting in urine and feces.  He has not had any of his meds since Friday.  He is unable to give me any hx.        Past Medical History:  Diagnosis Date  . ADD (attention deficit disorder)   . Anxiety   . Arthritis    right hip  . Bipolar 1 disorder ([place])   . Blood in urine   . CKD (chronic kidney disease), stage III   . Creatinine elevation   . Dementia ([place])    "early onset" (08/04/2017)  . Depression    bipolar [facility]  . Diabetes mellitus without complication ([place])   . Family history of anesthesia complication    pt is unsure , but pt father may have been difficult to arouse   . HCAP (healthcare-associated pneumonia) 10/31/2012  . History of kidney stones   . Hypertension   . Hypogonadism male   . Liver fatty degeneration   . Microscopic hematuria    hereditary s/p Urology eval  . Neuromuscular disorder ([place])    feet neuropathy   . Osteoarthritis of right hip 11/28/2011   2012 2015 s/p THR Severe  Dr Novella Olive    . Pleural effusion 11/02/2012  . Pneumonia 10-2012  . Pneumonia, organism unspecified(486) 11/02/2012  . Polysubstance dependence, non-opioid, in remission ([place])    remote  . Primary osteoarthritis of left hip 05/22/2015  . PTSD (post-traumatic stress disorder)    SOCIAL ANXIETY DISORDER   .  Schizoaffective disorder ([place])   . Substance abuse ([place])   . Suicide attempt by multiple drug overdose ([place]) 01-20-2016   Grieving his cat's death 06/23/15    Patient Active Problem List   Diagnosis Date Noted  . Rhabdomyolysis 01/01/2020  . Recurrent falls 05/15/2019  . AKI (acute kidney injury) ([place]) 05/14/2019  . Closed displaced fracture of lateral malleolus of left fibula 08/02/2017  . BPH (benign prostatic hyperplasia) 07/24/2017  . Hemorrhoid 05/27/2017  . Shoulder arthritis 05/27/2017  . Diabetic neuropathy ([place]) 05/27/2017  . Degenerative disc disease, lumbar 02/04/2017  . Constipation   . History of seizures 09/20/2016  . Acute lower UTI   . Urinary retention   . HLD (hyperlipidemia) 09/07/2016  . GERD (gastroesophageal reflux disease) 09/07/2016  . PTSD (post-traumatic stress disorder) 09/07/2016  . Fall 09/07/2016  . Chronic kidney disease   . Ataxia 10/14/2015  . Hypokalemia 10/03/2015  . Hemiparesis ([place])   . Dyslipidemia 05/31/2014  . Insomnia 11/11/2013  . Degenerative joint disease (DJD) of hip 08/16/2013  . Acute midline low back pain without sciatica 05/20/2011  . Erectile dysfunction 02/17/2011  . Constipation - functional 02/17/2011  . Diabetes type 2, uncontrolled ([place]) 11/06/2010  .  Hypogonadism male 05/20/2010  . TOBACCO USER 05/20/2010  . Demoralization and apathy 05/20/2010  . Schizoaffective disorder, bipolar type ([place]) 01/18/2010  . Right shoulder pain 01/18/2010  . Anxiety state 12/05/2006  . Essential hypertension 12/05/2006    Past Surgical History:  Procedure Laterality Date  . BACK SURGERY    . CLOSED REDUCTION METACARPAL WITH PERCUTANEOUS PINNING Right   . LUMBAR DISC SURGERY    . TONSILLECTOMY    . TOTAL HIP ARTHROPLASTY Right 08/16/2013   Procedure: TOTAL HIP ARTHROPLASTY ANTERIOR APPROACH;  Surgeon: Hessie Dibble, MD;  Location: [facility];  Service: Orthopedics;  Laterality: Right;  . TOTAL HIP ARTHROPLASTY Left 05/22/2015   Procedure:  TOTAL HIP ARTHROPLASTY ANTERIOR APPROACH;  Surgeon: Melrose Nakayama, MD;  Location: [facility];  Service: Orthopedics;  Laterality: Left;       Family History  Problem Relation Age of Onset  . Diabetes Father   . Cancer Mother        died of melanoma with mets  . Cervical cancer Sister   . Diabetes Sister   . Other Neg Hx        hypogonadism  . Colon cancer Neg Hx   . Colon polyps Neg Hx   . Esophageal cancer Neg Hx   . Rectal cancer Neg Hx   . Stomach cancer Neg Hx     Social History   Tobacco Use  . Smoking status: Former Smoker    Years: 0.25    Types: Cigarettes    Quit date: 03/22/2019    Years since quitting: 0.7  . Smokeless tobacco: Never Used  Vaping Use  . Vaping Use: Never used  Substance Use Topics  . Alcohol use: Not Currently  . Drug use: No    Comment: hx of marijuana/cocaine/crack use but sober since 20's    Home Medications Prior to Admission medications   Medication Sig Start Date End Date Taking? Authorizing Provider  acetaminophen (TYLENOL) 325 MG tablet Take 2 tablets (650 mg total) by mouth every 6 (six) hours as needed for mild pain (or Fever >/= 101). 08/11/17  Yes Elgergawy, Silver Huguenin, MD  alfuzosin (UROXATRAL) 10 MG 24 hr tablet Take 10 mg by mouth at bedtime.  12/31/19  Yes [provider]  ARIPiprazole (ABILIFY) 5 MG tablet TAKE 1 TABLET BY MOUTH DAILY FOR DEPRESSION Patient taking differently: Take 5 mg by mouth daily.  06/23/18  Yes [name], MD  atorvastatin (LIPITOR) 10 MG tablet Take 1 tablet (10 mg total) by mouth at bedtime. 10/05/19  Yes McClung, Dionne Bucy, PA-C  B-D UF III MINI PEN NEEDLES 31G X 5 MM MISC USE FOUR TIMES DAILY 04/05/19  Yes [name], MD  Blood Glucose Monitoring Suppl (ACCU-CHEK GUIDE ME) w/Device KIT Use to check blood sugar TID. E11.21 11/01/19  Yes Newlin, Enobong, MD  buPROPion (WELLBUTRIN XL) 300 MG 24 hr tablet Take 1 tablet (300 mg total) by mouth daily. 10/13/16  Yes Elgergawy, Silver Huguenin, MD  busPIRone  (BUSPAR) 15 MG tablet Take 15 mg by mouth 2 (two) times daily.    Yes [provider]  Continuous Blood Gluc Receiver (FREESTYLE LIBRE 14 DAY READER) DEVI Use as directed 10/21/19  Yes [name], MD  Continuous Blood Gluc Sensor (FREESTYLE LIBRE 14 DAY SENSOR) MISC Use as directed 10/21/19  Yes [name], MD  diclofenac sodium (VOLTAREN) 1 % GEL Apply 4 g topically 4 (four) times daily. 02/04/17  Yes [name], MD  divalproex (DEPAKOTE) 500 MG  DR tablet Take 3 tablets (1,500 mg total) by mouth at bedtime. 10/13/16 01/01/20 Yes Elgergawy, Silver Huguenin, MD  feeding supplement, ENSURE ENLIVE, (ENSURE ENLIVE) LIQD Take 237 mLs by mouth 2 (two) times daily between meals. 08/11/17  Yes Elgergawy, Silver Huguenin, MD  fluticasone (FLONASE) 50 MCG/ACT nasal spray Place 1 spray into both nostrils daily. SHAKE LIQUID AND USE 2 SPRAYS IN EACH NOSTRIL DAILY 11/01/19  Yes Newlin, Enobong, MD  gabapentin (NEURONTIN) 300 MG capsule Take 1 capsule (300 mg total) by mouth 2 (two) times daily. 10/05/19  Yes McClung, Levada Dy M, PA-C  glucose blood (ACCU-CHEK GUIDE) test strip CHECK SUGAR FOUR TIMES DAILY 07/22/18  Yes Newlin, Enobong, MD  insulin aspart (NOVOLOG FLEXPEN) 100 UNIT/ML FlexPen Inject 0-12 Units into the skin 2 (two) times daily. per sliding scale 10/05/19  Yes McClung, Angela M, PA-C  insulin detemir (LEVEMIR FLEXTOUCH) 100 UNIT/ML FlexPen Inject 45 Units into the skin 2 (two) times daily. 11/21/19  Yes Newlin, Charlane Ferretti, MD  lidocaine (LIDODERM) 5 % Place 1 patch onto the skin daily. Remove & Discard patch within 12 hours or as directed by MD 05/12/18  Yes [name], MD  linagliptin (TRADJENTA) 5 MG TABS tablet Take 1 tablet (5 mg total) by mouth daily. 10/05/19  Yes McClung, Angela M, PA-C  lisinopril-hydrochlorothiazide (ZESTORETIC) 10-12.5 MG tablet Take 1 tablet by mouth daily. 10/05/19  Yes McClung, Dionne Bucy, PA-C  methocarbamol (ROBAXIN) 500 MG tablet Take 2 tablets (1,000 mg total) by mouth 2  (two) times daily. 08/25/19  Yes Newlin, Charlane Ferretti, MD  NARCAN 4 MG/0.1ML LIQD nasal spray kit CALL 911. ADMINISTER A SINGLE SPRAY OF NARCAN IN ONE NOSTRIL. REPEAT EVERY 3 MINUTES AS NEEDED IF NO OR MINIMAL RESPONSE. 07/26/19  Yes [provider]  omeprazole (PRILOSEC) 40 MG capsule TAKE 1 CAPSULE(40 MG) BY MOUTH DAILY 11/01/19  Yes Newlin, Enobong, MD  tadalafil (CIALIS) 5 MG tablet TAKE ONE TABLET BY MOUTH EVERY DAY FOR BPH 11/01/19  Yes Newlin, Enobong, MD  Testosterone (ANDROGEL) 20.25 MG/1.25GM (1.62%) GEL Apply 20.25 mg topically every morning. Apply topically under arm pit alternating with each application. 11/01/19  Yes [name], MD  traMADol (ULTRAM) 50 MG tablet Take 1 tablet (50 mg total) by mouth at bedtime as needed (pain). 11/01/19  Yes [name], MD  traZODone (DESYREL) 150 MG tablet Take 1 tablet (150 mg total) by mouth at bedtime. 11/01/19  Yes [name], MD  QUEtiapine (SEROQUEL) 400 MG tablet Take 1 tablet (400 mg total) by mouth at bedtime. Patient not taking: Reported on 01/01/2020 10/13/16   Elgergawy, Silver Huguenin, MD    Allergies    Vicodin [hydrocodone-acetaminophen]  Review of Systems   Review of Systems  Unable to perform ROS: Mental status change    Physical Exam Updated Vital Signs BP (!) 170/103 (BP Location: Right Arm)   Pulse 96   Temp 98.9 F (37.2 C) (Oral)   Resp (!) 27   SpO2 96%   Physical Exam Vitals and nursing note reviewed.  Constitutional:      Appearance: He is ill-appearing.  HENT:     Head: Normocephalic.      Right Ear: External ear normal.     Left Ear: External ear normal.     Nose: Nose normal.     Mouth/Throat:     Mouth: Mucous membranes are dry.  Eyes:     Extraocular Movements: Extraocular movements intact.     Conjunctiva/sclera: Conjunctivae normal.     Pupils:  Pupils are equal, round, and reactive to light.  Cardiovascular:     Rate and Rhythm: Regular rhythm. Tachycardia present.     Pulses: Normal  pulses.     Heart sounds: Normal heart sounds.  Pulmonary:     Effort: Pulmonary effort is normal.     Breath sounds: Normal breath sounds.  Abdominal:     General: Abdomen is flat. Bowel sounds are normal.     Palpations: Abdomen is soft.  Musculoskeletal:     Cervical back: Normal range of motion and neck supple.     Comments: Bruises to back and to right hip  Skin:    General: Skin is warm.     Capillary Refill: Capillary refill takes less than 2 seconds.  Neurological:     Mental Status: He is disoriented.     ED Results / Procedures / Treatments   Labs (all labs ordered are listed, but only abnormal results are displayed) Labs Reviewed  CBC - Abnormal; Notable for the following components:      Result Value   Platelets 124 (*)    All other components within normal limits  URINALYSIS, ROUTINE W REFLEX MICROSCOPIC - Abnormal; Notable for the following components:   Glucose, UA >=500 (*)    Hgb urine dipstick LARGE (*)    Ketones, ur 20 (*)    Protein, ur 100 (*)    All other components within normal limits  BASIC METABOLIC PANEL - Abnormal; Notable for the following components:   Potassium 5.5 (*)    Glucose, Bld 303 (*)    BUN 36 (*)    Creatinine, Ser 2.06 (*)    GFR calc non Af Amer 36 (*)    GFR calc Af Amer 42 (*)    All other components within normal limits  CK - Abnormal; Notable for the following components:   Total CK 2,275 (*)    All other components within normal limits  HEPATIC FUNCTION PANEL - Abnormal; Notable for the following components:   AST 49 (*)    Indirect Bilirubin 1.0 (*)    All other components within normal limits  CBG MONITORING, ED - Abnormal; Notable for the following components:   Glucose-Capillary 294 (*)    All other components within normal limits  CBG MONITORING, ED - Abnormal; Notable for the following components:   Glucose-Capillary 310 (*)    All other components within normal limits  SARS CORONAVIRUS 2 BY RT PCR (HOSPITAL  ORDER, [facility])  RAPID URINE DRUG SCREEN, HOSP PERFORMED  AMMONIA  ETHANOL  CBG MONITORING, ED  CBG MONITORING, ED    EKG EKG Interpretation  Date/Time:  Sunday January 01 2020 15:16:56 EDT Ventricular Rate:  101 PR Interval:  148 QRS Duration: 74 QT Interval:  334 QTC Calculation: 433 R Axis:   52 Text Interpretation: Sinus tachycardia Nonspecific ST abnormality Abnormal ECG Since last tracing rate faster Confirmed by Isla Pence [phone]) on 01/01/2020 8:57:28 PM   Radiology CT Head Wo Contrast  Result Date: 01/01/2020 CLINICAL DATA:  Status post fall. EXAM: CT HEAD WITHOUT CONTRAST TECHNIQUE: Contiguous axial images were obtained from the base of the skull through the vertex without intravenous contrast. COMPARISON:  Sep 15, 2019 FINDINGS: Brain: There is mild cerebral atrophy with widening of the extra-axial spaces and ventricular dilatation. There are areas of decreased attenuation within the white matter tracts of the supratentorial brain, consistent with microvascular disease changes. Vascular: No hyperdense vessel or unexpected calcification.  Skull: Normal. Negative for fracture or focal lesion. Sinuses/Orbits: No acute finding. Other: None. IMPRESSION: 1. Generalized cerebral atrophy. 2. No acute intracranial abnormality. Electronically Signed   By: Virgina Norfolk M.D.   On: 01/01/2020 22:17   CT Cervical Spine Wo Contrast  Result Date: 01/01/2020 CLINICAL DATA:  Status post fall. EXAM: CT CERVICAL SPINE WITHOUT CONTRAST TECHNIQUE: Multidetector CT imaging of the cervical spine was performed without intravenous contrast. Multiplanar CT image reconstructions were also generated. COMPARISON:  Sep 09, 2019 FINDINGS: Alignment: Normal. Skull base and vertebrae: No acute fracture. No primary bone lesion or focal pathologic process. Soft tissues and spinal canal: No prevertebral fluid or swelling. No visible canal hematoma. Disc levels: Stable  moderate to marked severity anterior osteophyte formation is seen at the levels of C3-C4, C4-C5, C5-C6 and C6-C7. Marked severity central disc osteophyte formation at the level of C5-C6 is also seen. Mild intervertebral disc space narrowing is seen at the level of C5-C6. Mild bilateral facet joint hypertrophy is seen at the level of C5-C6. Upper chest: Negative. Other: None. IMPRESSION: 1. No acute fracture or subluxation. 2. Stable moderate to marked severity multilevel degenerative changes. Electronically Signed   By: Virgina Norfolk M.D.   On: 01/01/2020 22:21   DG Pelvis Portable  Result Date: 01/01/2020 CLINICAL DATA:  Status post fall. EXAM: PORTABLE PELVIS 1-2 VIEWS COMPARISON:  Sep 18, 2018 FINDINGS: Bilateral radiopaque total hip replacements are seen. There is no evidence of surrounding lucency to suggest the presence of hardware loosening or infection. Bilateral radiopaque pedicle screws are seen within the visualized portion of the lower lumbar spine. There is no evidence of pelvic fracture or diastasis. No pelvic bone lesions are seen. IMPRESSION: 1. Intact bilateral total hip replacements without postoperative changes seen within the lower lumbar spine. 2. No acute osseous abnormality. Electronically Signed   By: Virgina Norfolk M.D.   On: 01/01/2020 22:06   DG Chest Portable 1 View  Result Date: 01/01/2020 CLINICAL DATA:  Status post fall. EXAM: PORTABLE CHEST 1 VIEW COMPARISON:  None. FINDINGS: The heart size and mediastinal contours are within normal limits. Both lungs are clear. A chronic versus congenital deformity of the sixth right rib is seen. IMPRESSION: No active cardiopulmonary disease. Electronically Signed   By: Virgina Norfolk M.D.   On: 01/01/2020 22:04    Procedures Procedures (including critical care time)  Medications Ordered in ED Medications  sodium bicarbonate 150 mEq in sterile water 1,000 mL infusion ( Intravenous New Bag/Given 01/01/20 2245)  insulin aspart  (novoLOG) injection 8 Units (has no administration in time range)  sodium chloride 0.9 % bolus 1,000 mL (0 mLs Intravenous Stopped 01/01/20 2229)    ED Course  I have reviewed the triage vital signs and the nursing notes.  Pertinent labs & imaging results that were available during my care of the patient were reviewed by me and considered in my medical decision making (see chart for details).    MDM Rules/Calculators/A&P                          Ct head/neck nl. CXR and pelvis xray neg.  Pt does have evidence of rhabdomyolysis.  PT given IVFs and started on a bicarb drip.  He is hyperglycemic as he's not been able to take any of his meds.  He is given IVFs and IV insulin.    Pt d/w FP residents for admission.  CRITICAL CARE Performed by: Isla Pence  Total critical care time: 30 minutes  Critical care time was exclusive of separately billable procedures and treating other patients.  Critical care was necessary to treat or prevent imminent or life-threatening deterioration.  Critical care was time spent personally by me on the following activities: development of treatment plan with patient and/or surrogate as well as nursing, discussions with consultants, evaluation of patient's response to treatment, examination of patient, obtaining history from patient or surrogate, ordering and performing treatments and interventions, ordering and review of laboratory studies, ordering and review of radiographic studies, pulse oximetry and re-evaluation of patient's condition.  Final Clinical Impression(s) / ED Diagnoses Final diagnoses:  Fall, initial encounter  Traumatic rhabdomyolysis, initial encounter ([place])  Hyperglycemia  Dehydration  Stage 3b chronic kidney disease    Rx / DC Orders ED Discharge Orders    None       Isla Pence, MD 01/01/20 2310

## 2020-01-01 NOTE — H&P (Addendum)
[facility] Admission History and Physical Service Pager: [phone]  Patient name: [name] Medical record number: [id number] Date of birth: [birth year] Age: 54 y.o. Gender: male  Primary Care Provider: [name], MD Consultants: None Code Status: Full Preferred Emergency Contact: [name] (sister) [phone]  Chief Complaint: Altered mental status   Assessment and Plan: [name] is a 54 y.o. male presenting with AMS and rhabdomyolysis after found down x3 days. PMH is significant for T2DM, Bipolar disorder, degenerative disc disease with radiculopathy, insomnia, and dementia.  Rhabdomyolysis Patient was found down in the bathroom after possibly 3 days surrounded by his own feces and urine. On arrival, BP elevated to 170/103 with tachycardia at 112. In ED, patient given a 1L NS bolus, and started on sodium bicarb 158mq in 1L sterile water _0 /hr. Admission labs: CK 2,275, K+ 5.5, GFR 36, Cr 2.06 (baseline 1.9-2) U/A notable for large amounts of Hgb, glucosuria. UDS & etoh negative. CXR: no active cardiopulmonary disease. CT head: generalized cerebral atrophy (in previous CTs, noted to be advanced for age), no acute intracranial abnormality. CT cervical spine: No acute fracture/subluxation, stable moderate to marked severity multilevel degenerative changes. X-ray Pelvis: intact b/l total hip replacements, no acute osseous abnormality. On physical exam, patient did not have any TTP of extremities suggesting fractures. Neurologically, he had no FND or CN deficits. 4/5 strength in upper extremities unable to assess lower extremities due to intermittent difficulty following commands.  Differential for rhabdomyolysis includes trauma, etoh, drugs, toxins, metabolic disorders, infections. All of these unlikely causes given objective data. Can also consider seizures, psychosis/heat stroke. However, most likely due to being down for a long period of time.   - Admit  to med surg with cardiac monitoring, Attending: Dr. CErin Hearing - contact sister for further history  - Giving another bolus of 1L NS  - Trend CK - Continue NS _1 /hr - PT/OT evaluation needed  - Continue to monitor   AMS? Unclear cause of why he was down for three days. Patient is a poor historian (and has been noted to be at office visits in the past). It is assumed he was down for this amount of time per his sister's contact with him. His metabolic abnormalities do not reflect that he was down for three days. Can consider vasovagal episode (patient was found in the bathroom between toilet and wall); fall with resulting weakness (hx of hospitalizations for recurrent falls); however, none of this really explains why he would not be able to get up as he ambulates at baseline with a walker and [facility] sitter. Can consider his hx of degenerative disc disease with radiculopathy and hx of back surgery.  On exam, oriented only to himself. UDS negative, Etoh levels wnl, ammonia wnl. CBG 310 on arrival. Per chart review patient previously diagnosed with mild cognitive impairment but also notes of possible early onset dementia and behavioral issues. We are unclear of patient baseline at this time. DDX: chronic process vs. metabolic encephalopathy (less likely due to normal Etoh and ammonia levels), medication induced (patient is currently taking several psych medications and reports not taking them over the last few days).  - Further history needs to be obtained to further evaluate possible causes of AMS. - Continue to monitor for worsening mental status - contact sister for further history   Mild cognitive impairment (d/t bipolar, PTSD, h/o substance abuse) Patient was previously diagnosed with early-onset  dementia in Nov 2019 with disorientation, confusion, and frequent falls. This diagnosis was questioned and these spells of confusion and memory loss thought to  be related to psychiatry issues and metabolic derangements.  Patient seen by neurology on 09/14/19 and diagnosed with mild cognitive impairment d/t bipolar disorder, PTSD, and hx of substance abuse. This may be contributing to the patient's AMS.  - Continue to monitor   Schizoaffective disorder, bipolar type Followed by psychiatry. Patient home medications include: Buspar 64m BID, Abilify 517mdaily, depakote 150077mHS, wellbutrin 300m23mily. Will obtain depakote levels which may give us i[place]ight into whether or not he has been taking his medicaitons.  - Continue home meds - depakote level  Degenerative disc disease, lumbar w/ radiculopathy  Currently taking tramadol, followed by spine surgeon. Home medications include: tramadol 50mg40m PRN, Robaxin 1000mg 94m gabapentin 300mg B47mAmbulates with walker at baseline with HHPT. History of back surgery. Neurosurgeon is Dr. Stern. Vertell Limberhart review, L4-L5 fusion and epidurals in the past.  - Hold Robaxin (d/t rhabdo) - Continue tramadol and gabapentin  CKD  [facility] of 2.06, with a baseline of 1.7; GFR 36. CK level was 2275. - Continue hydration - Monitor urine output - Continue to monitor with CMP/BMP  HTN, chronic, well controlled. Patient has a history of HTN. Home medications include lisinopril-HCTZ 10-12.5 daily, patient reportedly not taking it currently.  BP on admission 166/101. - Continue home lisinopril-HCTZ - Continue to monitor  T2DM Last HbA1C 9.0 on 11/01/19. Currently taking Novolog sliding scale, Levemir 45U BID, Linagliptin 5mg dai15m According to prior documentation, patient has episodes of non-compliance with medications - Repeat HbA1c - sSSI - CBGs with meals and QHS  GERD, stable Patient taking omeprazole 40mg at 84m. - Continue home meds  BPH, stable Patient is currently taking cialis 5 mg daily and alfuzosin 10mg dail75mollowed by  urology. - Continue home meds  HLD Patient currently taking a statin, will hold due to rhabdomyolysis.   Hypogonadism Patient has a history of hypogonadism and is currently using testosterone gel 20.25mg. - Co75mue home testosterone gel  Chronic thrombocytopenia On admission, patient plt 124. In 04/2019, patient was noted to have thrombocytopenia in the 50s.   - Continue to monitor with CBC - o/p follow up   FEN/GI: NS _0 /hr,  carb modified Prophylaxis: Lovenox  Disposition: med-surg with tele  History of Present Illness:  General T SharMATAS BURROWSo. mal41presenting with altered mental status. Patient was alone in the room and was unable to describe why he was brought into the hospital. Patient was oriented to himself only. He states that he is "freezing cold" but that he doesn't hurt anywhere. Patient was unable to most questions. Patient also notes that his sister managed his medications. Per chart review, patient previously lived with sister but moved out on his own at the end of 2020 and has HH PT and hPyotee aide/sitter.  According to the ED provider, his sister is the POA and has been attempting to get the patient into assisted living. She last spoke with the patient on 9/10 and found him between the toilet and wall sitting in urine and feces. The sister, Quinton Voth, was noMaudie Mercuryn the room at the time of the exam and given the time of night, we did not obtain further history. Patient has a history of recurrent falls and multiple admissions for encephalopathy.   Review Of Systems:  Per HPI with the following additions:   Review of Systems  Unable to perform ROS: Mental status change     Patient Active Problem List   Diagnosis Date Noted   Rhabdomyolysis 01/01/2020   Recurrent falls 05/15/2019   AKI (acute kidney injury) ([place]) 05/14/2019   Closed displaced fracture of lateral malleolus of left fibula 08/02/2017   BPH (benign prostatic hyperplasia) 07/24/2017   Hemorrhoid  05/27/2017   Shoulder arthritis 05/27/2017   Diabetic neuropathy ([place]) 05/27/2017   Degenerative disc disease, lumbar 02/04/2017   Constipation    History of seizures 09/20/2016   Acute lower UTI    Urinary retention    HLD (hyperlipidemia) 09/07/2016   GERD (gastroesophageal reflux disease) 09/07/2016   PTSD (post-traumatic stress disorder) 09/07/2016   Fall 09/07/2016   Chronic kidney disease    Ataxia 10/14/2015   Hypokalemia 10/03/2015   Hemiparesis ([place])    Dyslipidemia 05/31/2014   Insomnia 11/11/2013   Degenerative joint disease (DJD) of hip 08/16/2013   Acute midline low back pain without sciatica 05/20/2011   Erectile dysfunction 02/17/2011   Constipation - functional 02/17/2011   Diabetes type 2, uncontrolled ([place]) 11/06/2010   Hypogonadism male 05/20/2010   TOBACCO USER 05/20/2010   Demoralization and apathy 05/20/2010   Schizoaffective disorder, bipolar type ([place]) 01/18/2010   Right shoulder pain 01/18/2010   Anxiety state 12/05/2006   Essential hypertension 12/05/2006    Past Medical History: Past Medical History:  Diagnosis Date   ADD (attention deficit disorder)    Anxiety    Arthritis    right hip   Bipolar 1 disorder ([place])    Blood in urine    CKD (chronic kidney disease), stage III    Creatinine elevation    Dementia ([place])    "early onset" (08/04/2017)   Depression    bipolar [facility]   Diabetes mellitus without complication ([place])    Family history of anesthesia complication    pt is unsure , but pt father may have been difficult to arouse    HCAP (healthcare-associated pneumonia) 10/31/2012   History of kidney stones    Hypertension    Hypogonadism male    Liver fatty degeneration    Microscopic hematuria    hereditary s/p Urology eval   Neuromuscular disorder ([place])    feet neuropathy    Osteoarthritis of right hip 11/28/2011   2012 2015 s/p THR Severe  Dr Novella Olive     Pleural effusion  11/02/2012   Pneumonia 10-2012   Pneumonia, organism unspecified([place]) 11/02/2012   Polysubstance dependence, non-opioid, in remission ([place])    remote   Primary osteoarthritis of left hip 05/22/2015   PTSD (post-traumatic stress disorder)    SOCIAL ANXIETY DISORDER    Schizoaffective disorder ([place])    Substance abuse ([place])    Suicide attempt by multiple drug overdose ([place]) 01-24-16   Grieving his cat's death 07-07-15    Past Surgical History: Past Surgical History:  Procedure Laterality Date   BACK SURGERY     CLOSED REDUCTION METACARPAL WITH PERCUTANEOUS PINNING Right    LUMBAR [place]     TOTAL HIP ARTHROPLASTY Right 08/16/2013   Procedure: TOTAL HIP ARTHROPLASTY ANTERIOR APPROACH;  Surgeon: Hessie Dibble, MD;  Location: [facility];  Service: Orthopedics;  Laterality: Right;   TOTAL HIP ARTHROPLASTY Left 05/22/2015   Procedure: TOTAL HIP ARTHROPLASTY ANTERIOR APPROACH;  Surgeon: Melrose Nakayama, MD;  Location: [facility];  Service: Orthopedics;  Laterality: Left;  Social History: Social History   Tobacco Use   Smoking status: Former Smoker    Years: 0.25    Types: Cigarettes    Quit date: 03/22/2019    Years since quitting: 0.7   Smokeless tobacco: Never Used  Vaping Use   Vaping Use: Never used  Substance Use Topics   Alcohol use: Not Currently   Drug use: No    Comment: hx of marijuana/cocaine/crack use but sober since 20's   Additional social history:   Please also refer to relevant sections of EMR.  Family History: Family History  Problem Relation Age of Onset   Diabetes Father    Cancer Mother        died of melanoma with mets   Cervical cancer Sister    Diabetes Sister    Other Neg Hx        hypogonadism   Colon cancer Neg Hx    Colon polyps Neg Hx    Esophageal cancer Neg Hx    Rectal cancer Neg Hx    Stomach cancer Neg Hx     Allergies and Medications: Allergies  Allergen Reactions   Vicodin  [Hydrocodone-Acetaminophen] Itching   No current facility-administered medications on file prior to encounter.   Current Outpatient Medications on File Prior to Encounter  Medication Sig Dispense Refill   acetaminophen (TYLENOL) 325 MG tablet Take 2 tablets (650 mg total) by mouth every 6 (six) hours as needed for mild pain (or Fever >/= 101).     alfuzosin (UROXATRAL) 10 MG 24 hr tablet Take 10 mg by mouth at bedtime.      ARIPiprazole (ABILIFY) 5 MG tablet TAKE 1 TABLET BY MOUTH DAILY FOR DEPRESSION (Patient taking differently: Take 5 mg by mouth daily. ) 30 tablet 0   atorvastatin (LIPITOR) 10 MG tablet Take 1 tablet (10 mg total) by mouth at bedtime. 90 tablet 1   B-D UF III MINI PEN NEEDLES 31G X 5 MM MISC USE FOUR TIMES DAILY 100 each 12   Blood Glucose Monitoring Suppl (ACCU-CHEK GUIDE ME) w/Device KIT Use to check blood sugar TID. E11.21 1 kit 0   buPROPion (WELLBUTRIN XL) 300 MG 24 hr tablet Take 1 tablet (300 mg total) by mouth daily. 30 tablet 0   busPIRone (BUSPAR) 15 MG tablet Take 15 mg by mouth 2 (two) times daily.      Continuous Blood Gluc Receiver (FREESTYLE LIBRE 14 DAY READER) DEVI Use as directed 1 each 4   Continuous Blood Gluc Sensor (FREESTYLE LIBRE 14 DAY SENSOR) MISC Use as directed 2 each 4   diclofenac sodium (VOLTAREN) 1 % GEL Apply 4 g topically 4 (four) times daily. 100 g 1   divalproex (DEPAKOTE) 500 MG DR tablet Take 3 tablets (1,500 mg total) by mouth at bedtime. 90 tablet 0   feeding supplement, ENSURE ENLIVE, (ENSURE ENLIVE) LIQD Take 237 mLs by mouth 2 (two) times daily between meals. 237 mL 12   fluticasone (FLONASE) 50 MCG/ACT nasal spray Place 1 spray into both nostrils daily. SHAKE LIQUID AND USE 2 SPRAYS IN EACH NOSTRIL DAILY 48 g 0   gabapentin (NEURONTIN) 300 MG capsule Take 1 capsule (300 mg total) by mouth 2 (two) times daily. 180 capsule 1   glucose blood (ACCU-CHEK GUIDE) test strip CHECK SUGAR FOUR TIMES DAILY 100 each 12    insulin aspart (NOVOLOG FLEXPEN) 100 UNIT/ML FlexPen Inject 0-12 Units into the skin 2 (two) times daily. per sliding scale 6 pen 3   insulin  detemir (LEVEMIR FLEXTOUCH) 100 UNIT/ML FlexPen Inject 45 Units into the skin 2 (two) times daily. 6 pen 6   lidocaine (LIDODERM) 5 % Place 1 patch onto the skin daily. Remove & Discard patch within 12 hours or as directed by MD 30 patch 0   linagliptin (TRADJENTA) 5 MG TABS tablet Take 1 tablet (5 mg total) by mouth daily. 90 tablet 1   lisinopril-hydrochlorothiazide (ZESTORETIC) 10-12.5 MG tablet Take 1 tablet by mouth daily. 90 tablet 1   methocarbamol (ROBAXIN) 500 MG tablet Take 2 tablets (1,000 mg total) by mouth 2 (two) times daily. 120 tablet 3   NARCAN 4 MG/0.1ML LIQD nasal spray kit CALL 911. ADMINISTER A SINGLE SPRAY OF NARCAN IN ONE NOSTRIL. REPEAT EVERY 3 MINUTES AS NEEDED IF NO OR MINIMAL RESPONSE.     omeprazole (PRILOSEC) 40 MG capsule TAKE 1 CAPSULE(40 MG) BY MOUTH DAILY 90 capsule 1   tadalafil (CIALIS) 5 MG tablet TAKE ONE TABLET BY MOUTH EVERY DAY FOR BPH 90 tablet 0   Testosterone (ANDROGEL) 20.25 MG/1.25GM (1.62%) GEL Apply 20.25 mg topically every morning. Apply topically under arm pit alternating with each application. 1.25 g 3   traMADol (ULTRAM) 50 MG tablet Take 1 tablet (50 mg total) by mouth at bedtime as needed (pain). 30 tablet 1   traZODone (DESYREL) 150 MG tablet Take 1 tablet (150 mg total) by mouth at bedtime. 90 tablet 0   QUEtiapine (SEROQUEL) 400 MG tablet Take 1 tablet (400 mg total) by mouth at bedtime. (Patient not taking: Reported on 01/01/2020) 30 tablet 11    Objective: BP (!) 170/103 (BP Location: Right Arm)    Pulse 96    Temp 98.9 F (37.2 C) (Oral)    Resp (!) 27    SpO2 96%  Exam: General: supine in bed, shivering, patient had his eyes closed during most of the exam. Ill appearing  Eyes: PERRLA. No deviation. EOMI.  ENTM: poor dentition, no uvular deviation. Dry MM.  Cardiovascular: RRR, no m/r/g  appreciated. No BLEE. 2+ RP and DP Respiratory: CTAB, normal WOB, no rales/wheezes. No respiratory distress. Gastrointestinal: soft, non-tender, non-distended MSK: fasciculations in left lower extremity, 4/5 strength with hand grip, elbow flexion and extension. Unable to follow commands to test lower extremity strength.  Neuro: A&O x1 to self, reflexes 2+ on b/l LE and UE, fasciculations noted in left lower extremity, able to follow some commands but not all Psych: flat affect, appeared disheveled. Cooperative at times, answering some questions  Labs and Imaging: CBC BMET  Recent Labs  Lab 01/01/20 1520  WBC 8.0  HGB 13.9  HCT 41.2  PLT 124*   Recent Labs  Lab 01/01/20 1520  NA 140  K 5.5*  CL 103  CO2 23  BUN 36*  CREATININE 2.06*  GLUCOSE 303*  CALCIUM 9.8     EKG: Sinus tachycardia, T wave abnormalities in lateral leads   Lilland, Alana, DO 01/01/2020, 11:10 PM PGY-1, [place] Intern pager: [phone], text pages welcome  FPTS Upper-Level Resident Addendum I have independently interviewed and examined the patient. I have discussed the above with the original author and agree with their documentation. My edits for correction/addition/clarification are in - blue. Please see also any attending notes.  [place] [facility] pager: [phone] (text pages welcome through AMION)  Wilber Oliphant, M.D.  PGY-3 01/02/2020 6:17 AM

## 2020-01-01 NOTE — ED Triage Notes (Addendum)
Pt to triage via [facility].  Pt has a Actuary and the family has been trying to call the sitter for 3 days with no response.  Pt found in floor at house between toilet and wall.  States he has been there for 3 days.  Pt has history of dementia. Lac to top of head, hematoma to back of head.  18g L FA.  EMS administered NS 500cc.  CCBG 550 by EMS.  CBG 294 here..  C-collar in place by EMS.

## 2020-01-02 DIAGNOSIS — F250 Schizoaffective disorder, bipolar type: Secondary | ICD-10-CM | POA: Diagnosis present

## 2020-01-02 DIAGNOSIS — F4010 Social phobia, unspecified: Secondary | ICD-10-CM | POA: Diagnosis present

## 2020-01-02 DIAGNOSIS — M2550 Pain in unspecified joint: Secondary | ICD-10-CM | POA: Diagnosis not present

## 2020-01-02 DIAGNOSIS — E1165 Type 2 diabetes mellitus with hyperglycemia: Secondary | ICD-10-CM | POA: Diagnosis present

## 2020-01-02 DIAGNOSIS — W1811XA Fall from or off toilet without subsequent striking against object, initial encounter: Secondary | ICD-10-CM | POA: Diagnosis present

## 2020-01-02 DIAGNOSIS — G3184 Mild cognitive impairment, so stated: Secondary | ICD-10-CM | POA: Diagnosis present

## 2020-01-02 DIAGNOSIS — N400 Enlarged prostate without lower urinary tract symptoms: Secondary | ICD-10-CM | POA: Diagnosis not present

## 2020-01-02 DIAGNOSIS — T796XXA Traumatic ischemia of muscle, initial encounter: Secondary | ICD-10-CM | POA: Diagnosis present

## 2020-01-02 DIAGNOSIS — F339 Major depressive disorder, recurrent, unspecified: Secondary | ICD-10-CM | POA: Diagnosis not present

## 2020-01-02 DIAGNOSIS — N1832 Chronic kidney disease, stage 3b: Secondary | ICD-10-CM | POA: Diagnosis present

## 2020-01-02 DIAGNOSIS — E1140 Type 2 diabetes mellitus with diabetic neuropathy, unspecified: Secondary | ICD-10-CM | POA: Diagnosis present

## 2020-01-02 DIAGNOSIS — T796XXD Traumatic ischemia of muscle, subsequent encounter: Secondary | ICD-10-CM | POA: Diagnosis not present

## 2020-01-02 DIAGNOSIS — R410 Disorientation, unspecified: Secondary | ICD-10-CM | POA: Diagnosis not present

## 2020-01-02 DIAGNOSIS — N189 Chronic kidney disease, unspecified: Secondary | ICD-10-CM | POA: Diagnosis not present

## 2020-01-02 DIAGNOSIS — E1122 Type 2 diabetes mellitus with diabetic chronic kidney disease: Secondary | ICD-10-CM | POA: Diagnosis present

## 2020-01-02 DIAGNOSIS — R251 Tremor, unspecified: Secondary | ICD-10-CM | POA: Diagnosis present

## 2020-01-02 DIAGNOSIS — M6282 Rhabdomyolysis: Secondary | ICD-10-CM | POA: Diagnosis present

## 2020-01-02 DIAGNOSIS — D6959 Other secondary thrombocytopenia: Secondary | ICD-10-CM | POA: Diagnosis present

## 2020-01-02 DIAGNOSIS — R4182 Altered mental status, unspecified: Secondary | ICD-10-CM | POA: Diagnosis not present

## 2020-01-02 DIAGNOSIS — L89106 Pressure-induced deep tissue damage of unspecified part of back: Secondary | ICD-10-CM | POA: Diagnosis present

## 2020-01-02 DIAGNOSIS — F259 Schizoaffective disorder, unspecified: Secondary | ICD-10-CM | POA: Diagnosis not present

## 2020-01-02 DIAGNOSIS — W19XXXA Unspecified fall, initial encounter: Secondary | ICD-10-CM | POA: Diagnosis not present

## 2020-01-02 DIAGNOSIS — F909 Attention-deficit hyperactivity disorder, unspecified type: Secondary | ICD-10-CM | POA: Diagnosis present

## 2020-01-02 DIAGNOSIS — F413 Other mixed anxiety disorders: Secondary | ICD-10-CM | POA: Diagnosis not present

## 2020-01-02 DIAGNOSIS — I129 Hypertensive chronic kidney disease with stage 1 through stage 4 chronic kidney disease, or unspecified chronic kidney disease: Secondary | ICD-10-CM | POA: Diagnosis present

## 2020-01-02 DIAGNOSIS — G4700 Insomnia, unspecified: Secondary | ICD-10-CM | POA: Diagnosis present

## 2020-01-02 DIAGNOSIS — E860 Dehydration: Secondary | ICD-10-CM

## 2020-01-02 DIAGNOSIS — F4310 Post-traumatic stress disorder, unspecified: Secondary | ICD-10-CM | POA: Diagnosis present

## 2020-01-02 DIAGNOSIS — L8990 Pressure ulcer of unspecified site, unspecified stage: Secondary | ICD-10-CM | POA: Insufficient documentation

## 2020-01-02 DIAGNOSIS — R488 Other symbolic dysfunctions: Secondary | ICD-10-CM | POA: Diagnosis not present

## 2020-01-02 DIAGNOSIS — E785 Hyperlipidemia, unspecified: Secondary | ICD-10-CM | POA: Diagnosis present

## 2020-01-02 DIAGNOSIS — D696 Thrombocytopenia, unspecified: Secondary | ICD-10-CM | POA: Diagnosis not present

## 2020-01-02 DIAGNOSIS — F411 Generalized anxiety disorder: Secondary | ICD-10-CM | POA: Diagnosis present

## 2020-01-02 DIAGNOSIS — R531 Weakness: Secondary | ICD-10-CM | POA: Diagnosis not present

## 2020-01-02 DIAGNOSIS — E1340 Other specified diabetes mellitus with diabetic neuropathy, unspecified: Secondary | ICD-10-CM | POA: Diagnosis not present

## 2020-01-02 DIAGNOSIS — Z20822 Contact with and (suspected) exposure to covid-19: Secondary | ICD-10-CM | POA: Diagnosis present

## 2020-01-02 DIAGNOSIS — K219 Gastro-esophageal reflux disease without esophagitis: Secondary | ICD-10-CM | POA: Diagnosis present

## 2020-01-02 DIAGNOSIS — M792 Neuralgia and neuritis, unspecified: Secondary | ICD-10-CM | POA: Diagnosis not present

## 2020-01-02 DIAGNOSIS — N179 Acute kidney failure, unspecified: Secondary | ICD-10-CM | POA: Diagnosis present

## 2020-01-02 DIAGNOSIS — M4726 Other spondylosis with radiculopathy, lumbar region: Secondary | ICD-10-CM | POA: Diagnosis present

## 2020-01-02 DIAGNOSIS — Z7401 Bed confinement status: Secondary | ICD-10-CM | POA: Diagnosis not present

## 2020-01-02 DIAGNOSIS — R296 Repeated falls: Secondary | ICD-10-CM | POA: Diagnosis present

## 2020-01-02 LAB — RENAL FUNCTION PANEL
Albumin: 3 g/dL — ABNORMAL LOW (ref 3.5–5.0)
Anion gap: 11 (ref 5–15)
BUN: 35 mg/dL — ABNORMAL HIGH (ref 6–20)
CO2: 24 mmol/L (ref 22–32)
Calcium: 8.9 mg/dL (ref 8.9–10.3)
Chloride: 104 mmol/L (ref 98–111)
Creatinine, Ser: 1.64 mg/dL — ABNORMAL HIGH (ref 0.61–1.24)
GFR calc Af Amer: 55 mL/min — ABNORMAL LOW (ref >60)
GFR calc non Af Amer: 47 mL/min — ABNORMAL LOW (ref >60)
Glucose, Bld: 162 mg/dL — ABNORMAL HIGH (ref 70–99)
Phosphorus: 3.7 mg/dL (ref 2.5–4.6)
Potassium: 4.2 mmol/L (ref 3.5–5.1)
Sodium: 139 mmol/L (ref 135–145)

## 2020-01-02 LAB — COMPREHENSIVE METABOLIC PANEL
ALT: 16 U/L (ref 0–44)
AST: 47 U/L — ABNORMAL HIGH (ref 15–41)
Albumin: 3.5 g/dL (ref 3.5–5.0)
Alkaline Phosphatase: 81 U/L (ref 38–126)
Anion gap: 14 (ref 5–15)
BUN: 41 mg/dL — ABNORMAL HIGH (ref 6–20)
CO2: 23 mmol/L (ref 22–32)
Calcium: 9.4 mg/dL (ref 8.9–10.3)
Chloride: 106 mmol/L (ref 98–111)
Creatinine, Ser: 1.94 mg/dL — ABNORMAL HIGH (ref 0.61–1.24)
GFR calc Af Amer: 45 mL/min — ABNORMAL LOW (ref >60)
GFR calc non Af Amer: 39 mL/min — ABNORMAL LOW (ref >60)
Glucose, Bld: 266 mg/dL — ABNORMAL HIGH (ref 70–99)
Potassium: 4.3 mmol/L (ref 3.5–5.1)
Sodium: 143 mmol/L (ref 135–145)
Total Bilirubin: 0.8 mg/dL (ref 0.3–1.2)
Total Protein: 7 g/dL (ref 6.5–8.1)

## 2020-01-02 LAB — GLUCOSE, CAPILLARY
Glucose-Capillary: 199 mg/dL — ABNORMAL HIGH (ref 70–99)
Glucose-Capillary: 235 mg/dL — ABNORMAL HIGH (ref 70–99)
Glucose-Capillary: 218 mg/dL — ABNORMAL HIGH (ref 70–99)
Glucose-Capillary: 160 mg/dL — ABNORMAL HIGH (ref 70–99)

## 2020-01-02 LAB — CBG MONITORING, ED: Glucose-Capillary: 258 mg/dL — ABNORMAL HIGH (ref 70–99)

## 2020-01-02 LAB — MAGNESIUM: Magnesium: 2 mg/dL (ref 1.7–2.4)

## 2020-01-02 LAB — VALPROIC ACID LEVEL: Valproic Acid Lvl: 133 ug/mL — ABNORMAL HIGH (ref 50.0–100.0)

## 2020-01-02 LAB — CK
Total CK: 2205 U/L — ABNORMAL HIGH (ref 49–397)
Total CK: 956 U/L — ABNORMAL HIGH (ref 49–397)

## 2020-01-02 LAB — PHOSPHORUS: Phosphorus: 3.3 mg/dL (ref 2.5–4.6)

## 2020-01-02 MED ORDER — SODIUM CHLORIDE 0.9 % IV SOLN: INTRAVENOUS | Status: DC

## 2020-01-02 MED ORDER — TRAZODONE HCL 50 MG PO TABS
150.0000 mg | ORAL_TABLET | Freq: Every day | ORAL | Status: DC
  Administered 2020-01-02 – 2020-01-04 (×4): 150 mg via ORAL
  Filled 2020-01-02 (×2): qty 1
  Filled 2020-01-02: qty 3
  Filled 2020-01-02: qty 1

## 2020-01-02 MED ORDER — ARIPIPRAZOLE 5 MG PO TABS
5.0000 mg | ORAL_TABLET | Freq: Every day | ORAL | Status: DC
  Administered 2020-01-02 – 2020-01-05 (×4): 5 mg via ORAL
  Filled 2020-01-02 (×4): qty 1

## 2020-01-02 MED ORDER — ALFUZOSIN HCL ER 10 MG PO TB24
10.0000 mg | ORAL_TABLET | Freq: Every day | ORAL | Status: DC
  Administered 2020-01-02 – 2020-01-04 (×4): 10 mg via ORAL
  Filled 2020-01-02 (×5): qty 1

## 2020-01-02 MED ORDER — LISINOPRIL 10 MG PO TABS
10.0000 mg | ORAL_TABLET | Freq: Every day | ORAL | Status: DC
  Administered 2020-01-02 – 2020-01-05 (×4): 10 mg via ORAL
  Filled 2020-01-02 (×4): qty 1

## 2020-01-02 MED ORDER — SODIUM CHLORIDE 0.9 % IV BOLUS
1000.0000 mL | Freq: Once | INTRAVENOUS | Status: AC
  Administered 2020-01-02: 1000 mL via INTRAVENOUS

## 2020-01-02 MED ORDER — BUSPIRONE HCL 5 MG PO TABS
15.0000 mg | ORAL_TABLET | Freq: Two times a day (BID) | ORAL | Status: DC
  Administered 2020-01-02 – 2020-01-05 (×8): 15 mg via ORAL
  Filled 2020-01-02 (×7): qty 3
  Filled 2020-01-02: qty 2

## 2020-01-02 MED ORDER — HYDROCHLOROTHIAZIDE 12.5 MG PO CAPS
12.5000 mg | ORAL_CAPSULE | Freq: Every day | ORAL | Status: DC
  Administered 2020-01-02 – 2020-01-05 (×4): 12.5 mg via ORAL
  Filled 2020-01-02 (×4): qty 1

## 2020-01-02 MED ORDER — PANTOPRAZOLE SODIUM 40 MG PO TBEC
80.0000 mg | DELAYED_RELEASE_TABLET | Freq: Every day | ORAL | Status: DC
  Administered 2020-01-02 – 2020-01-05 (×4): 80 mg via ORAL
  Filled 2020-01-02 (×4): qty 2

## 2020-01-02 MED ORDER — DIVALPROEX SODIUM 500 MG PO DR TAB
1500.0000 mg | DELAYED_RELEASE_TABLET | Freq: Every day | ORAL | Status: DC
  Administered 2020-01-02: 1500 mg via ORAL
  Filled 2020-01-02: qty 6

## 2020-01-02 MED ORDER — BUPROPION HCL ER (XL) 150 MG PO TB24
300.0000 mg | ORAL_TABLET | Freq: Every day | ORAL | Status: DC
  Administered 2020-01-02 – 2020-01-05 (×4): 300 mg via ORAL
  Filled 2020-01-02 (×4): qty 2

## 2020-01-02 MED ORDER — FLUTICASONE PROPIONATE 50 MCG/ACT NA SUSP
1.0000 | Freq: Every day | NASAL | Status: DC
  Administered 2020-01-02 – 2020-01-05 (×4): 1 via NASAL
  Filled 2020-01-02: qty 16

## 2020-01-02 MED ORDER — TRAMADOL HCL 50 MG PO TABS
50.0000 mg | ORAL_TABLET | Freq: Every evening | ORAL | Status: DC | PRN
  Administered 2020-01-02 – 2020-01-04 (×2): 50 mg via ORAL
  Filled 2020-01-02 (×2): qty 1

## 2020-01-02 MED ORDER — GABAPENTIN 300 MG PO CAPS
300.0000 mg | ORAL_CAPSULE | Freq: Two times a day (BID) | ORAL | Status: DC
  Administered 2020-01-02 – 2020-01-05 (×8): 300 mg via ORAL
  Filled 2020-01-02 (×9): qty 1

## 2020-01-02 MED ORDER — TESTOSTERONE 50 MG/5GM (1%) TD GEL
2.5000 g | Freq: Every morning | TRANSDERMAL | Status: DC
  Administered 2020-01-02 – 2020-01-05 (×4): 2.5 g via TRANSDERMAL
  Filled 2020-01-02 (×4): qty 5

## 2020-01-02 MED ORDER — TADALAFIL 5 MG PO TABS
5.0000 mg | ORAL_TABLET | Freq: Every day | ORAL | Status: DC
  Administered 2020-01-02 – 2020-01-05 (×4): 5 mg via ORAL
  Filled 2020-01-02 (×6): qty 1

## 2020-01-02 MED ORDER — INSULIN DETEMIR 100 UNIT/ML ~~LOC~~ SOLN
12.0000 [IU] | Freq: Two times a day (BID) | SUBCUTANEOUS | Status: DC
  Administered 2020-01-02 – 2020-01-05 (×6): 12 [IU] via SUBCUTANEOUS
  Filled 2020-01-02 (×8): qty 0.12

## 2020-01-02 MED ORDER — ENOXAPARIN SODIUM 40 MG/0.4ML ~~LOC~~ SOLN
40.0000 mg | Freq: Every day | SUBCUTANEOUS | Status: DC
  Administered 2020-01-02 – 2020-01-05 (×4): 40 mg via SUBCUTANEOUS
  Filled 2020-01-02 (×4): qty 0.4

## 2020-01-02 MED ORDER — INSULIN ASPART 100 UNIT/ML ~~LOC~~ SOLN
0.0000 [IU] | Freq: Three times a day (TID) | SUBCUTANEOUS | Status: DC
  Administered 2020-01-02: 2 [IU] via SUBCUTANEOUS
  Administered 2020-01-02 (×2): 3 [IU] via SUBCUTANEOUS
  Administered 2020-01-03: 1 [IU] via SUBCUTANEOUS
  Administered 2020-01-03: 2 [IU] via SUBCUTANEOUS
  Administered 2020-01-03: 1 [IU] via SUBCUTANEOUS
  Administered 2020-01-04: 3 [IU] via SUBCUTANEOUS
  Administered 2020-01-04: 2 [IU] via SUBCUTANEOUS
  Administered 2020-01-04 – 2020-01-05 (×2): 3 [IU] via SUBCUTANEOUS
  Administered 2020-01-05: 2 [IU] via SUBCUTANEOUS

## 2020-01-02 NOTE — Progress Notes (Signed)
Inpatient Diabetes Program Recommendations  AACE/ADA: New Consensus Statement on Inpatient Glycemic Control (2015)  Target Ranges:  Prepandial:   less than 140 mg/dL      Peak postprandial:   less than 180 mg/dL (1-2 hours)      Critically ill patients:  140 - 180 mg/dL   Lab Results  Component Value Date   GLUCAP 199 (H) 01/02/2020   HGBA1C 9.0 (A) 11/01/2019    Review of Glycemic Control Results for DAEVEON, ZWEBER (MRN [id number]) as of 01/02/2020 11:00  Ref. Range 01/01/2020 21:50 01/02/2020 02:00 01/02/2020 07:16  Glucose-Capillary Latest Ref Range: 70 - 99 mg/dL 310 (H) 258 (H) 199 (H)   Diabetes history: Type 2 DM Outpatient Diabetes medications: Novolog 0-12 units BID, Levemir 45 units BID, Tradjenta 5 mg QD Current orders for Inpatient glycemic control: Novolog 0-9 units TID  Inpatient Diabetes Program Recommendations:    Consider adding Levemir 12 units BID.   Thanks, Bronson Curb, MSN, RNC-OB Diabetes Coordinator [phone] (8a-5p)

## 2020-01-02 NOTE — Evaluation (Signed)
Physical Therapy Evaluation Patient Details Name: [name] MRN: [id number] DOB: Feb 24, 1966 Today's Date: 01/02/2020   History of Present Illness  54 y.o. male presenting with AMS and rhabdomyolysis after found down x3 days. PMH is significant for T2DM, Bipolar disorder, degenerative disc disease with radiculopathy, insomnia, and dementia.    Clinical Impression  Pt in bed upon arrival of PT, agreeable to evaluation at this time. The pt is unable to give reliable history at this time, but per his chart he was mobilizing with an AD, living alone, but had an aide come for a few hours each day. The pt now presents with limitations in functional mobility, strength, activity tolerance, and stability due to above dx, pain, and limited command following, and will continue to benefit from skilled PT to address these deficits. The pt required modA to complete transition to sitting EOB, and then minA to stabilize while sitting EOB. He was unable to attempt transfers at this time due to reports of dizziness and poor command following. The pt will continue to benefit from skilled PT to progress functional strength and tolerance for transfers OOB, as well as static and dynamic stability to reduce risk of falls. Due to the limited information available about pt prior level of function or caregiver availability and his current mobility status, it is likely he will need continued rehab following d/c.      Follow Up Recommendations SNF;Supervision/Assistance - 24 hour    Equipment Recommendations   (defer to post acute)    Recommendations for Other Services       Precautions / Restrictions Precautions Precautions: Fall Restrictions Weight Bearing Restrictions: No Other Position/Activity Restrictions: AMS      Mobility  Bed Mobility Overal bed mobility: Needs Assistance Bed Mobility: Sit to Supine;Supine to Sit;Rolling Rolling: Mod assist   Supine to sit: +2 for safety/equipment;Mod assist Sit  to supine: +2 for safety/equipment;Mod assist   General bed mobility comments: Varying assist levels based on changing volition, level of alertness. Pt noted to prefer BLE crossed position. Difficulty sequencing movements.   Transfers Overall transfer level: Needs assistance               General transfer comment: unable to assess due to pt appearing dizzy/lightheaded in sitting position. elevated bp     Balance Overall balance assessment: Needs assistance Sitting-balance support: Bilateral upper extremity supported;Feet supported Sitting balance-Leahy Scale: Poor Sitting balance - Comments: BUE support and min A at times (at least minG due to full body tremors and movement)                                     Pertinent Vitals/Pain Pain Assessment: Faces Faces Pain Scale: Hurts little more Pain Location: grimacing with bed mobility, especially advancing BLE Pain Descriptors / Indicators: Grimacing Pain Intervention(s): Monitored during session;Limited activity within patient's tolerance;Repositioned    Home Living Family/patient expects to be discharged to:: Unsure                 Additional Comments: Lives alone, possibly has aide but unable to verify with pt    Prior Function           Comments: pt unable to provide PLOF/home setup data. Per chart review, he was walking with AD at baseline. living alone but was suppose to be receiving some [place] aide hours.      Hand Dominance   Dominant Hand:  Right    Extremity/Trunk Assessment   Upper Extremity Assessment Upper Extremity Assessment: Defer to OT evaluation    Lower Extremity Assessment Lower Extremity Assessment: Generalized weakness;Difficult to assess due to impaired cognition (able to move BLE voluntarily to cross his legs in supine and sitting EOB, needed min/modA to mobilize due to difficulty with command following.)    Cervical / Trunk Assessment Cervical / Trunk Assessment:  Normal  Communication   Communication: No difficulties  Cognition Arousal/Alertness: Lethargic Behavior During Therapy: Flat affect Overall Cognitive Status: History of cognitive impairments - at baseline                                 General Comments: a little more alert EOB but appeared to be feeling dizzy/lightheaded. Pt fell asleep once back in supine. Able to answer basic y/n questions and give 1-2 word replies at times.       General Comments General comments (skin integrity, edema, etc.): small laceration on the top of his head, bruising on his L elbow. BP 179/97 (114) and HR 115 when sitting EOB    Exercises     Assessment/Plan    PT Assessment Patient needs continued PT services  PT Problem List Decreased strength;Decreased mobility;Decreased safety awareness;Decreased activity tolerance;Decreased balance;Pain       PT Treatment Interventions DME instruction;Gait training;Functional mobility training;Therapeutic activities;Patient/family education;Cognitive remediation;Balance training;Therapeutic exercise    PT Goals (Current goals can be found in the Care Plan section)  Acute Rehab PT Goals Patient Stated Goal: unable to state PT Goal Formulation: With patient Time For Goal Achievement: 01/16/20 Potential to Achieve Goals: Fair    Frequency Min 2X/week   Barriers to discharge Decreased caregiver support      Co-evaluation PT/OT/SLP Co-Evaluation/Treatment: Yes Reason for Co-Treatment: Complexity of the patient's impairments (multi-system involvement);Necessary to address cognition/behavior during functional activity;For patient/therapist safety PT goals addressed during session: Mobility/safety with mobility;Balance OT goals addressed during session: ADL's and self-care       AM-PAC PT "6 Clicks" Mobility  Outcome Measure Help needed turning from your back to your side while in a flat bed without using bedrails?: A Little Help needed moving  from lying on your back to sitting on the side of a flat bed without using bedrails?: A Lot Help needed moving to and from a bed to a chair (including a wheelchair)?: A Lot Help needed standing up from a chair using your arms (e.g., wheelchair or bedside chair)?: A Lot Help needed to walk in hospital room?: A Lot Help needed climbing 3-5 steps with a railing? : Total 6 Click Score: 12    End of Session   Activity Tolerance: Patient limited by lethargy;Other (comment) (dizziness) Patient left: in bed;with call bell/phone within reach;with bed alarm set Nurse Communication: Mobility status PT Visit Diagnosis: Muscle weakness (generalized) (M62.81);Difficulty in walking, not elsewhere classified (R26.2);Pain Pain - part of body:  (generalized BLE)    Time: 2409-7353 PT Time Calculation (min) (ACUTE ONLY): 23 min   Charges:   PT Evaluation $PT Eval Moderate Complexity: 1 Mod          Karma Ganja, PT, DPT   Acute Rehabilitation Department Pager #: [phone]  Otho Bellows 01/02/2020, 3:08 PM

## 2020-01-02 NOTE — Progress Notes (Signed)
Called and spoke with patients sister. She states that he had previously been living with her and her husband for 12 years. Then last November moved out once he had been granted disability. She states that he has had a step decline in mental status since moving out. She reports that he sees a Engineer, water regularly, but has not been able to get any appointments with Psychiatry for medication management. She states she has been attempting to get an appointment with [facility] but they have not had any available. She states that he has run out of one of his medications but is not sure which one.  She states that he has home PT that works with him twice a week for an hour and that he has an aid that comes out Monday-Friday for 2-2.5 hours. The aid ensures he takes his morning doses of medications. However the sister states that he has problems with taking his medications from time to time. He will either forget to take them and go through the weekend without taking them or will accidentally take multiple days worth over the course of a day because he will forget that he has already taken them or what day it is.   She states that his mental abilities fluctuate. He can be "fine" for a few weeks at a time and then start forgetting everything. She states she has attempted to get him into assisted living but at this point does not qualify except for a Memory Unit that has been recommended he not go to because he would sharply decline.  Discussed that there were no broken bones found on scans and that he had no intracranial findings. Discussed that consult had been placed for Psych and that I will call her to update her once that is done.

## 2020-01-02 NOTE — Consult Note (Signed)
Writer attempted to assist patient however patient was unable to participate in evaluation.  Will adjust medication at this time.  Please reconsult psychiatry once patient is able to participate.  He is observed to be very lethargic and somnolent, and unwilling to provide much information.  This was discussed with his nurse Alma Friendly who agree with the current findings.  Patient was resumed on his home medication to include valproic acid 1500 mg p.o. nightly, BuSpar 15 p.o. twice daily, Wellbutrin XL 300 mg p.o. daily, Abilify 5 mg p.o. daily, gabapentin 300 mg p.o. twice daily, trazodone 150 p.o. nightly. -Will obtain valproic acid level level elevated at 133. -Will hold valproic acid until levels are normal. -Patient and parents to have ongoing acute metabolic encephalopathy, CK enzymes remain elevated. -Reconsult psychiatry once patient is able to participate in assessment.

## 2020-01-02 NOTE — TOC Initial Note (Signed)
Transition of Care [facility]) - Initial/Assessment Note    Patient Details  Name: [name] MRN: [id number] Date of Birth: [birth year]  [facility] CM/SW Contact:    Benard Halsted, LCSW Phone Number: 01/02/2020, 4:17 PM  Clinical Narrative:                 CSW received consult for possible SNF placement at time of discharge. CSW spoke with patient's sister (and HCPOA), [name], regarding PT recommendation of SNF placement at time of discharge. Patient's sister reported that patient lives alone and has an aide for a few hours a day through his Disability. [name] is currently unable to care for patient at home given patient's current physical needs and fall risk. She expressed understanding of PT recommendation and is agreeable to SNF placement at time of discharge. [name] reports preference for somewhere nice, but patient has been to [facility] before. CSW discussed insurance authorization process and provided Medicare SNF ratings list. Patient has received the COVID vaccines and will be able to have visitation at SNF. [name] expressed being hopeful for patient to rehab and feel better soon. [facility] is currently under review. No further questions reported at this time. CSW to continue to follow and assist with discharge planning needs.   Expected Discharge Plan: Skilled Nursing Facility Barriers to Discharge: [facility]), SNF Pending bed offer, Continued Medical Work up   Patient Goals and CMS Choice Patient states their goals for this hospitalization and ongoing recovery are:: Rehab CMS Medicare.gov Compare Post Acute Care list provided to:: Patient Represenative (must comment) (HCPOA, sister) Choice offered to / list presented to : [facility] POA / Guardian, Sibling (Sister, [name])  Expected Discharge Plan and Services Expected Discharge Plan: [place] In-house Referral: Clinical Social Work   Post Acute Care Choice: [facility] Living  arrangements for the past 2 months: [place]                                      Prior Living Arrangements/Services Living arrangements for the past 2 months: Single Family Home Lives with:: Self Patient language and need for interpreter reviewed:: Yes Do you feel safe going back to the place where you live?: Yes      Need for Family Participation in Patient Care: Yes (Comment) Care giver support system in place?: Yes (comment) Current home services: DME, Homehealth aide Criminal Activity/Legal Involvement Pertinent to Current Situation/Hospitalization: No - Comment as needed  Activities of Daily Living      Permission Sought/Granted Permission sought to share information with : Facility Sport and exercise psychologist, Family Supports Permission granted to share information with : No  Share Information with NAME: [name]  Permission granted to share info w AGENCY: [facility]  Permission granted to share info w Relationship: Sister/HCPOA  Permission granted to share info w Contact Information: [phone]  Emotional Assessment Appearance:: Appears older than stated age Attitude/Demeanor/Rapport: Unable to Assess Affect (typically observed): Unable to Assess Orientation: : Oriented to Self Alcohol / Substance Use: Not Applicable Psych Involvement: Yes (comment) (For med management)  Admission diagnosis:  Rhabdomyolysis [M62.82] Dehydration [E86.0] Hyperglycemia [R73.9] Fall, initial encounter [W19.XXXA] Traumatic rhabdomyolysis, initial encounter ([place]) [T79.6XXA] Stage 3b chronic kidney disease [N18.32] Patient Active Problem List   Diagnosis Date Noted  . Pressure injury of skin 01/02/2020  . Dehydration   . Hyperglycemia   . Rhabdomyolysis 01/01/2020  .  Recurrent falls 05/15/2019  . AKI (acute kidney injury) ([place]) 05/14/2019  . Closed displaced fracture of lateral malleolus of left fibula 08/02/2017  . BPH (benign prostatic hyperplasia) 07/24/2017  . Hemorrhoid  05/27/2017  . Shoulder arthritis 05/27/2017  . Diabetic neuropathy ([place]) 05/27/2017  . Degenerative disc disease, lumbar 02/04/2017  . Constipation   . History of seizures 09/20/2016  . Acute lower UTI   . Urinary retention   . HLD (hyperlipidemia) 09/07/2016  . GERD (gastroesophageal reflux disease) 09/07/2016  . PTSD (post-traumatic stress disorder) 09/07/2016  . Fall 09/07/2016  . Chronic kidney disease   . Ataxia 10/14/2015  . Hypokalemia 10/03/2015  . Hemiparesis ([place])   . Dyslipidemia 05/31/2014  . Insomnia 11/11/2013  . Degenerative joint disease (DJD) of hip 08/16/2013  . Acute midline low back pain without sciatica 05/20/2011  . Erectile dysfunction 02/17/2011  . Constipation - functional 02/17/2011  . Diabetes type 2, uncontrolled ([place]) 11/06/2010  . Hypogonadism male 05/20/2010  . TOBACCO USER 05/20/2010  . Demoralization and apathy 05/20/2010  . Schizoaffective disorder, bipolar type ([place]) 01/18/2010  . Right shoulder pain 01/18/2010  . Anxiety state 12/05/2006  . Essential hypertension 12/05/2006   PCP:  [name], MD Pharmacy:   [facility], [place] [address] Phone: [phone] Fax: [place], [place] - [address] [place] [address] [place] [address] [place] [address] Phone: [phone] Fax: [phone]     Social Determinants of Health (SDOH) Interventions    Readmission Risk Interventions Readmission Risk Prevention Plan 01/02/2020  Transportation Screening Complete  PCP or Specialist Appt within 3-5 Days Complete  HRI or [place] Complete  Social Work Consult for [place] Planning/Counseling Complete  Palliative Care Screening Not Applicable  Medication Review Press photographer) Referral to Pharmacy  Some recent data might be hidden

## 2020-01-02 NOTE — Progress Notes (Signed)
RE: [name]. [name] Date of Birth: [birth year] Date: 01/02/20  Please be advised that the above-named patient has a primary diagnosis of dementia which supersedes any psychiatric diagnosis. Patient will require a short-term nursing home stay - anticipated 30 days or less for rehabilitation and strengthening.  The plan is for return home.

## 2020-01-02 NOTE — Evaluation (Signed)
Occupational Therapy Evaluation Patient Details Name: [name] MRN: [id number] DOB: [birth year] Today's Date: 01/02/2020    History of Present Illness 54 y.o. male presenting with AMS and rhabdomyolysis after found down x3 days. PMH is significant for T2DM, Bipolar disorder, degenerative disc disease with radiculopathy, insomnia, and dementia.   Clinical Impression   Pt admitted with the above diagnoses and presents with below problem list. Pt will benefit from continued acute OT to address the below listed deficits and maximize independence with basic ADLs prior to d/c to venue below. Per chart review, at baseline pt ambulated with AD, lives alone, has [place] aide M-F 2-2.5 hours. Pt lethargic throughout session though a bit more alert sitting EOB. Intermittently able to answer basic y/n questions and provide 1-2 word responses. Pt currently mod A +2 for bed mobility (varies depending on volition and level of alertness). Sat EOB a few minutes at min A level. Pt needing BUE support to sit EOB. Intermittent tremulous movements of BUE noted. Pt back asleep once in supine at end of session.       Follow Up Recommendations  SNF;Supervision/Assistance - 24 hour    Equipment Recommendations  Other (comment) (defer to next venue)    Recommendations for Other Services       Precautions / Restrictions Precautions Precautions: Fall Restrictions Weight Bearing Restrictions: No Other Position/Activity Restrictions: AMS      Mobility Bed Mobility Overal bed mobility: Needs Assistance Bed Mobility: Sit to Supine;Supine to Sit;Rolling Rolling: Mod assist   Supine to sit: +2 for safety/equipment;Mod assist Sit to supine: +2 for safety/equipment;Mod assist   General bed mobility comments: Varying assist levels based on changing volition, level of alertness. Pt noted to prefer BLE crossed position. Difficulty sequencing movements.   Transfers                 General transfer comment:  unable to assess due to pt appearing dizzy/lightheaded in sitting position. elevated bp    Balance Overall balance assessment: Needs assistance Sitting-balance support: Bilateral upper extremity supported;Feet supported Sitting balance-Leahy Scale: Poor Sitting balance - Comments: BUE support and min A at times.                                    ADL either performed or assessed with clinical judgement   ADL Overall ADL's : Needs assistance/impaired Eating/Feeding: Minimal assistance Eating/Feeding Details (indicate cue type and reason): supported sitting or bed level Grooming: Maximal assistance   Upper Body Bathing: Maximal assistance;Sitting   Lower Body Bathing: Maximal assistance;+2 for safety/equipment;Sitting/lateral leans   Upper Body Dressing : Maximal assistance;Sitting   Lower Body Dressing: Maximal assistance;+2 for safety/equipment;Sitting/lateral leans                 General ADL Comments: Pt completed bed mobility and sat EOB a couple of minutes. BP assessed EOB then pt back to supine at end of session. Needs BUE support in sitting position     Vision   Additional Comments: unable to assess 2/2 lethargy and cognition. Would benefit from assessing in functional context.      Perception     Praxis      Pertinent Vitals/Pain Pain Assessment: Faces Faces Pain Scale: Hurts little more Pain Location: grimacing with bed mobility, especially advancing BLE Pain Descriptors / Indicators: Grimacing Pain Intervention(s): Monitored during session;Repositioned;Limited activity within patient's tolerance     Hand Dominance Right  Extremity/Trunk Assessment Upper Extremity Assessment Upper Extremity Assessment: Generalized weakness;Difficult to assess due to impaired cognition (intermittent BUE tremulous movements)   Lower Extremity Assessment Lower Extremity Assessment: Defer to PT evaluation   Cervical / Trunk Assessment Cervical / Trunk  Assessment: Normal   Communication     Cognition Arousal/Alertness: Lethargic Behavior During Therapy: Flat affect Overall Cognitive Status: History of cognitive impairments - at baseline                                 General Comments: a little more alert EOB but appeared to be feeling dizzy/lightheaded. Pt fell asleep once back in supine. Able to answer basic y/n questions and give 1-2 word replies at times.    General Comments       Exercises     Shoulder Instructions      Home Living Family/patient expects to be discharged to:: Unsure                                 Additional Comments: Lives alone      Prior Functioning/Environment          Comments: pt unable to provide PLOF/home setup data. Per chart review, he was walking with AD at baseline. living alone but was suppose to be receiving some [place] aide hours.         OT Problem List: Decreased strength;Decreased activity tolerance;Impaired balance (sitting and/or standing);Decreased coordination;Decreased cognition;Decreased safety awareness;Decreased knowledge of use of DME or AE;Decreased knowledge of precautions;Impaired UE functional use;Pain      OT Treatment/Interventions: Self-care/ADL training;Therapeutic exercise;Neuromuscular education;Energy conservation;DME and/or AE instruction;Therapeutic activities;Cognitive remediation/compensation;Patient/family education;Balance training    OT Goals(Current goals can be found in the care plan section) Acute Rehab OT Goals Patient Stated Goal: unable to state OT Goal Formulation: Patient unable to participate in goal setting Time For Goal Achievement: 01/16/20 Potential to Achieve Goals: Fair ADL Goals Pt Will Perform Grooming: with mod assist;sitting Pt Will Perform Upper Body Bathing: with mod assist;sitting Pt Will Perform Lower Body Bathing: with mod assist;sit to/from stand Pt Will Transfer to Toilet: with mod assist;bedside  commode Pt Will Perform Toileting - Clothing Manipulation and hygiene: with mod assist;sit to/from stand;sitting/lateral leans Additional ADL Goal #1: Pt will sit EOB for 5 minutes at min guard level to prepare for EOB/OOB ADLs.  OT Frequency: Min 2X/week   Barriers to D/C: Decreased caregiver support          Co-evaluation PT/OT/SLP Co-Evaluation/Treatment: Yes Reason for Co-Treatment: Complexity of the patient's impairments (multi-system involvement);Necessary to address cognition/behavior during functional activity;For patient/therapist safety;To address functional/ADL transfers   OT goals addressed during session: ADL's and self-care      AM-PAC OT "6 Clicks" Daily Activity     Outcome Measure Help from another person eating meals?: A Lot Help from another person taking care of personal grooming?: A Lot Help from another person toileting, which includes using toliet, bedpan, or urinal?: Total Help from another person bathing (including washing, rinsing, drying)?: Total Help from another person to put on and taking off regular upper body clothing?: A Lot Help from another person to put on and taking off regular lower body clothing?: A Lot 6 Click Score: 10   End of Session Nurse Communication: Mobility status;Precautions;Other (comment) (pt had loose bowel movement during session)  Activity Tolerance: Patient limited by lethargy;Other (comment) (appeared dizzy/lightheaded  sitting EOB, elevated bp.) Patient left: in bed;with call bell/phone within reach;with bed alarm set  OT Visit Diagnosis: Unsteadiness on feet (R26.81);Other abnormalities of gait and mobility (R26.89);Muscle weakness (generalized) (M62.81);Ataxia, unspecified (R27.0);Apraxia (R48.2);Other symptoms and signs involving the nervous system (R29.898);Other symptoms and signs involving cognitive function;Cognitive communication deficit (R41.841);Pain                Time: 2751-7001 OT Time Calculation (min): 30  min Charges:  OT General Charges $OT Visit: 1 Visit OT Evaluation $OT Eval Moderate Complexity: [place], OT Acute Rehabilitation Services Pager: [phone] Office: [phone]   Hortencia Pilar 01/02/2020, 2:33 PM

## 2020-01-02 NOTE — ED Notes (Signed)
Pt restless, continues to remove monitoring wires, condom cath/primofit. Pt noted to have urinated in the bed, sheets changed, moved to hospital bed for comfort

## 2020-01-02 NOTE — Progress Notes (Addendum)
Family Medicine Teaching Service Daily Progress Note Intern Pager: [phone]  Patient name: [name] Medical record number: [id number] Date of birth: Sep 14, 1965 Age: 54 y.o. Gender: male  Primary Care Provider: [name], MD Consultants: Psych Code Status: Full  Pt Overview and Major Events to Date:  Admitted on 9/12  Assessment and Plan:  [name] is a 54 y.o. male presenting with AMS and rhabdomyolysis after found down x3 days. PMH is significant for T2DM, Schizoaffective Disorder: Bipolar type, degenerative disc disease with radiculopathy, insomnia, and dementia.   Rhabdomyolysis Patient was found down in the bathroom after 3 days with a laceration and hematoma on his head. Patient has not had any of his medications in that time period. On arrival, BP elevated to 170/103 with tachycardia at 112. CT head: generalized cerebral atrophy, no acute intracranial abnormality. CT cervical spine: No acute fracture/subluxation, stable moderate to marked severity multilevel degenerative changes. X-ray Pelvis: intact b/l total hip replacements, no acute osseous abnormality. CXR: no active cardiopulmonary disease. CK is 2205, K is 4.3 , GFR 36, Cr 1.94 (baseline 1.7) U/A notable for large amounts of Hgb. UDS negative DDX: consider vasovagal episode (patient was found in the bathroom between toilet and wall); fall with resulting weakness (hx of hospitalizations for recurrent falls)  -Continue NS 125 cc/hr for 3 days  -Trend CK -PT/OT evaluation needed  -Continue to monitor    AMS (multifactora) Patient has baseline cognitive impairment and was oriented to himself only on exam. Unsure of patient baseline. Will contact sister to obtain more history. Admission labs: Etoh wnl, ammonia wnl  DDX: metabolic encephalopathy (less likely due to normal Etoh and ammonia levels), medication induced (patient is currently taking several psych medications).  -Consult to Psych has been  placed -Further history needs to be obtained to further evaluate possible causes of AMS. -Continue to monitor for worsening mental status   AKI on CKD  Patient is followed by nephrology at [facility]. Patient admitted with Cr of 2.06, with a baseline of 1.7; GFR 36. CK level today is 2205. AKI likely secondary to rhabdomyolysis and dehydration. Creatinine today is 1.94. -Continue NS 125 cc/hr for 3 days -Monitor urine output -Continue to monitor with CMP/BMP   HTN Patient has a history of HTN. Home medications include lisinopril-HCTZ 10-12.5 daily, patient reportedly not taking it currently.  BP on admission 166/101. - Continue home lisinopril-HCTZ - Continue to monitor   Schizoaffective disorder, bipolar type Followed by psychiatry. Patient home medications include: Buspar 15mg  BID, Abilify 5mg  daily, depakote 1500mg  QHS, wellbutrin 300mg  daily. Have placed psych consult. -Psych Consult placed -Continue home meds   T2DM Last HbA1C 9.0 on 11/01/19. Currently taking Novolog sliding scale, Levemir 45U BID, Linagliptin 5mg  daily. According to prior documentation, patient has episodes of non-compliance with medications - Repeat HbA1c - sSSI - CBGs with meals and QHS   Mild cognitive impairment (d/t bipolar, PTSD, h/o substance abuse) Patient was previously diagnosed with early-onset demential in Nov 2019 with disorientation, confusion, and frequent falls. This diagnosis was questioned and these spells of confusion and memory loss thought to be related to psychiatry issues and metabolic derangements. Patient seen by neurology on 09/14/19 and diagnosed with mild cognitive impairment d/t bipolar disorder, PTSD, and hx of substance abuse. This may be contributing to the patient's AMS.  - Continue to monitor    GERD, stable Patient taking omeprazole 40mg  at home. -Continue home meds   BPH, stable Patient is currently taking cialis 5  mg daily and alfuzosin 10mg  daily,  followed by urology. - Continue home meds   HDL Patient currently taking a statin, will hold due to rhabdomyolysis.    Degenerative disc disease, lumbar Currently taking tramadol, followed by spine surgeon. Home medications include: tramadol 50mg  QHS PRN, Robaxin 1000mg  BID, gabapentin 300mg  BID - Hold Robaxin (d/t rhabdo) - Continue tramadol and gabapentin   Hypogonadism Patient has a history of hypogonadism and is currently using testosterone gel 20.25mg . - Continue home testosterone gel   Chronic thrombocytopenia On admission, patient plt 124. In 04/2019, patient was noted to have thrombocytopenia in the 50s.   - Continue to monitor with CBC   FEN/GI: Carb Modified PPx: Lovenox  Disposition: Med-Surg  Prior to Admission Living Arrangement: Home Anticipated Discharge Location: Home/SNF Barriers to Discharge: Psych Consult Anticipated discharge in approximately 1-3 day(s).   Subjective:  Interviewed patient at bedside. During the interview patient was displaying thought blocking and was unable to answer questions. Only oriented to name.   He reports that he still does not remember what happened. He reports that he has no current pain.    Will call sister this afternoon for more background information.   Objective: Temp:  [98.2 F (36.8 C)-98.9 F (37.2 C)] 98.4 F (36.9 C) (09/13 0344) Pulse Rate:  [88-105] 88 (09/13 0344) Resp:  [14-27] 14 (09/13 0344) BP: (162-175)/(92-103) 174/92 (09/13 0344) SpO2:  [96 %-100 %] 98 % (09/13 0344) Physical Exam:  Physical Exam Vitals and nursing note reviewed.  Constitutional:      General: He is not in acute distress.    Appearance: He is normal weight. He is not ill-appearing or toxic-appearing.  HENT:     Head: Normocephalic and atraumatic.  Cardiovascular:     Rate and Rhythm: Normal rate and regular rhythm.     Pulses: Normal pulses.          Radial pulses are 2+ on the right side and 2+ on the left side.        Dorsalis pedis pulses are 2+ on the right side and 2+ on the left side.     Heart sounds: Normal heart sounds, S1 normal and S2 normal. No murmur heard.   Pulmonary:     Effort: Pulmonary effort is normal. No respiratory distress.     Breath sounds: Normal breath sounds. No wheezing.  Abdominal:     General: Bowel sounds are normal. There is no distension.     Palpations: Abdomen is soft.     Tenderness: There is no abdominal tenderness.  Musculoskeletal:     Right lower leg: No edema.     Left lower leg: No edema.  Psychiatric:        Mood and Affect: Affect is flat.        Speech: Speech is delayed.        Behavior: Behavior is slowed. Behavior is cooperative.     Comments: Appears to thought block. Only oriented to person, not place, not time.      Laboratory: Recent Labs  Lab 01/01/20 1520  WBC 8.0  HGB 13.9  HCT 41.2  PLT 124*   Recent Labs  Lab 01/01/20 1520 01/01/20 2151 01/02/20 0201  NA 140  --  143  K 5.5*  --  4.3  CL 103  --  106  CO2 23  --  23  BUN 36*  --  41*  CREATININE 2.06*  --  1.94*  CALCIUM 9.8  --  9.4  PROT  --  7.4 7.0  BILITOT  --  1.1 0.8  ALKPHOS  --  95 81  ALT  --  20 16  AST  --  49* 47*  GLUCOSE 303*  --  266*    Valproic Acid: 133 (high)  CK: 2205 (high) Mag: 2.0 (WNL) Phos: 3.3 (WNL) Ammonia: 33 (WNL) EtOH: <10 (WNL) Urine Rapid Drug Screen: None detected   Imaging/Diagnostic Tests:   CT Cervical Spine Wo Contrast: No acute fracture or subluxation. Stable moderate to marked severity multilevel degenerative changes.   CT Head Wo Contrast: Generalized cerebral atrophy. No acute intracranial abnormality.   DG Pelvis Portable: Intact bilateral total hip replacements without postoperative changes seen within the lower lumbar spine. No acute osseous abnormality.   DG Chest Portable 1 View: No active cardiopulmonary disease.   Briant Cedar, MD 01/02/2020, 6:24 AM PGY-1, [place]  Intern pager: [phone], text pages welcome

## 2020-01-02 NOTE — NC FL2 (Addendum)
[place] MEDICAID FL2 LEVEL OF CARE SCREENING TOOL     IDENTIFICATION  Patient Name: [name] Birthdate: [birth year] Sex: male Admission Date (Current Location): 01/01/2020  [facility] and [place] Number:  Herbalist and Address:  [facility], [place] [address] [place], [place] [address]      Provider Number: [id number]  Attending Physician Name and Address:  Lind Covert, MD  Relative Name and Phone Number:  Doroteo Glassman, [phone]    Current Level of Care: Hospital Recommended Level of Care: [place] Prior Approval Number:    Date Approved/Denied:   PASRR Number:  [id number]   Discharge Plan: SNF    Current Diagnoses: Patient Active Problem List   Diagnosis Date Noted  . Dehydration   . Hyperglycemia   . Rhabdomyolysis 01/01/2020  . Recurrent falls 05/15/2019  . AKI (acute kidney injury) ([place]) 05/14/2019  . Closed displaced fracture of lateral malleolus of left fibula 08/02/2017  . BPH (benign prostatic hyperplasia) 07/24/2017  . Hemorrhoid 05/27/2017  . Shoulder arthritis 05/27/2017  . Diabetic neuropathy ([place]) 05/27/2017  . Degenerative disc disease, lumbar 02/04/2017  . Constipation   . History of seizures 09/20/2016  . Acute lower UTI   . Urinary retention   . HLD (hyperlipidemia) 09/07/2016  . GERD (gastroesophageal reflux disease) 09/07/2016  . PTSD (post-traumatic stress disorder) 09/07/2016  . Fall 09/07/2016  . Chronic kidney disease   . Ataxia 10/14/2015  . Hypokalemia 10/03/2015  . Hemiparesis ([place])   . Dyslipidemia 05/31/2014  . Insomnia 11/11/2013  . Degenerative joint disease (DJD) of hip 08/16/2013  . Acute midline low back pain without sciatica 05/20/2011  . Erectile dysfunction 02/17/2011  . Constipation - functional 02/17/2011  . Diabetes type 2, uncontrolled ([place]) 11/06/2010  . Hypogonadism male 05/20/2010  . TOBACCO USER 05/20/2010  . Demoralization and apathy  05/20/2010  . Schizoaffective disorder, bipolar type ([place]) 01/18/2010  . Right shoulder pain 01/18/2010  . Anxiety state 12/05/2006  . Essential hypertension 12/05/2006    Orientation RESPIRATION BLADDER Height & Weight     Self  Normal Incontinent, External catheter Weight: 238 lb (108 kg) Height:     BEHAVIORAL SYMPTOMS/MOOD NEUROLOGICAL BOWEL NUTRITION STATUS   (Dementia)   Incontinent Diet (Please see DC Summary)  AMBULATORY STATUS COMMUNICATION OF NEEDS Skin   Extensive Assist Verbally PU Stage and Appropriate Care (Stage I on sacrum; deep tissue injury on back)                       Personal Care Assistance Level of Assistance  Bathing, Feeding, Dressing Bathing Assistance: Maximum assistance Feeding assistance: Maximum assistance Dressing Assistance: Maximum assistance     Functional Limitations Info             SPECIAL CARE FACTORS FREQUENCY  PT (By licensed PT), OT (By licensed OT)     PT Frequency: 5x/week OT Frequency: 5x/week            Contractures Contractures Info: Not present    Additional Factors Info  Code Status, Allergies, Psychotropic, Insulin Sliding Scale Code Status Info: Full Allergies Info: Vicodin Psychotropic Info: Abilify, Wellbutrin, Buspar Insulin Sliding Scale Info: See dc summary for dose       Current Medications (01/02/2020):  This is the current hospital active medication list Current Facility-Administered Medications  Medication Dose Route Frequency Provider Last Rate Last Admin  . 0.9 %  sodium chloride infusion   Intravenous  Continuous Briant Cedar, MD 125 mL/hr at 01/02/20 1204 New Bag at 01/02/20 1204  . alfuzosin (UROXATRAL) 24 hr tablet 10 mg  10 mg Oral QHS Lilland, Alana, DO   10 mg at 01/02/20 0210  . ARIPiprazole (ABILIFY) tablet 5 mg  5 mg Oral Daily Lilland, Alana, DO   5 mg at 01/02/20 0935  . buPROPion (WELLBUTRIN XL) 24 hr tablet 300 mg  300 mg Oral Daily Lilland, Alana, DO   300 mg at 01/02/20  0935  . busPIRone (BUSPAR) tablet 15 mg  15 mg Oral BID Lilland, Alana, DO   15 mg at 01/02/20 0935  . enoxaparin (LOVENOX) injection 40 mg  40 mg Subcutaneous Daily Lilland, Alana, DO   40 mg at 01/02/20 0937  . fluticasone (FLONASE) 50 MCG/ACT nasal spray 1 spray  1 spray Each Nare Daily Lilland, Alana, DO   1 spray at 01/02/20 1045  . gabapentin (NEURONTIN) capsule 300 mg  300 mg Oral BID Lilland, Alana, DO   300 mg at 01/02/20 0936  . hydrochlorothiazide (MICROZIDE) capsule 12.5 mg  12.5 mg Oral Daily Lilland, Alana, DO   12.5 mg at 01/02/20 0936  . insulin aspart (novoLOG) injection 0-9 Units  0-9 Units Subcutaneous TID WC Lilland, Alana, DO   3 Units at 01/02/20 1204  . insulin detemir (LEVEMIR) injection 12 Units  12 Units Subcutaneous BID Briant Cedar, MD   12 Units at 01/02/20 1456  . lisinopril (ZESTRIL) tablet 10 mg  10 mg Oral Daily Lilland, Alana, DO   10 mg at 01/02/20 0936  . pantoprazole (PROTONIX) EC tablet 80 mg  80 mg Oral Daily Lilland, Alana, DO   80 mg at 01/02/20 0935  . tadalafil (CIALIS) tablet 5 mg  5 mg Oral Daily Lilland, Alana, DO   5 mg at 01/02/20 0936  . testosterone (ANDROGEL) 50 MG/5GM (1%) gel 2.5 g  2.5 g Transdermal q morning - 10a Lilland, Alana, DO   2.5 g at 01/02/20 1046  . traMADol (ULTRAM) tablet 50 mg  50 mg Oral QHS PRN Lilland, Alana, DO   50 mg at 01/02/20 0203  . traZODone (DESYREL) tablet 150 mg  150 mg Oral QHS Lilland, Alana, DO   150 mg at 01/02/20 0204     Discharge Medications: Please see discharge summary for a list of discharge medications.  Relevant Imaging Results:  Relevant Lab Results:   Additional Information SSN: [id number]. Has received both Moderna vaccines.  Benard Halsted, LCSW

## 2020-01-02 NOTE — Progress Notes (Signed)
New Admission Note:  Arrival Method: By stretcher from ED around [phone] Mental Orientation: Alert to self Telemetry: Completed, box 9 Assessment: Completed Skin: Completed, refer to flowsheets IV: Left forearm infusing normal saline at 125 Pain: Faces-0 Tubes: None Safety Measures: Safety Fall Prevention Plan was given, discussed. Admission: Completed [facility] [place] Orientation: Patient has been orientated to the room, unit and the staff. Family: None  Orders have been reviewed and implemented. Will continue to monitor the patient. Call light has been placed within reach and bed alarm has been activated. Pt is on a low bed near the nurses station.   Perry Mount, RN  Phone Number: [phone]

## 2020-01-03 DIAGNOSIS — W19XXXA Unspecified fall, initial encounter: Secondary | ICD-10-CM

## 2020-01-03 DIAGNOSIS — T796XXD Traumatic ischemia of muscle, subsequent encounter: Secondary | ICD-10-CM

## 2020-01-03 LAB — RENAL FUNCTION PANEL
Albumin: 3 g/dL — ABNORMAL LOW (ref 3.5–5.0)
Anion gap: 8 (ref 5–15)
BUN: 31 mg/dL — ABNORMAL HIGH (ref 6–20)
CO2: 25 mmol/L (ref 22–32)
Calcium: 9.1 mg/dL (ref 8.9–10.3)
Chloride: 105 mmol/L (ref 98–111)
Creatinine, Ser: 1.43 mg/dL — ABNORMAL HIGH (ref 0.61–1.24)
GFR calc Af Amer: >60 mL/min (ref >60)
GFR calc non Af Amer: 56 mL/min — ABNORMAL LOW (ref >60)
Glucose, Bld: 147 mg/dL — ABNORMAL HIGH (ref 70–99)
Phosphorus: 3 mg/dL (ref 2.5–4.6)
Potassium: 4.7 mmol/L (ref 3.5–5.1)
Sodium: 138 mmol/L (ref 135–145)

## 2020-01-03 LAB — HEMOGLOBIN A1C
Hgb A1c MFr Bld: 7.9 % — ABNORMAL HIGH (ref 4.8–5.6)
Mean Plasma Glucose: 180.03 mg/dL

## 2020-01-03 LAB — GLUCOSE, CAPILLARY
Glucose-Capillary: 160 mg/dL — ABNORMAL HIGH (ref 70–99)
Glucose-Capillary: 138 mg/dL — ABNORMAL HIGH (ref 70–99)
Glucose-Capillary: 134 mg/dL — ABNORMAL HIGH (ref 70–99)
Glucose-Capillary: 176 mg/dL — ABNORMAL HIGH (ref 70–99)

## 2020-01-03 LAB — MAGNESIUM: Magnesium: 2 mg/dL (ref 1.7–2.4)

## 2020-01-03 NOTE — Progress Notes (Addendum)
Family Medicine Teaching Service Daily Progress Note Intern Pager: [phone]  Patient name: [name] Medical record number: [id number] Date of birth: [birth year] Age: 54 y.o. Gender: male  Primary Care Provider: [name], MD Consultants: Psych Code Status: Full  Pt Overview and Major Events to Date:  Admitted on 9/12  Assessment and Plan:   [name] a 54 y.o.malepresenting with AMS and rhabdomyolysis after found down x3 days. PMH is significant forT2DM, Schizoaffective Disorder: Bipolar type, degenerative disc disease with radiculopathy, insomnia, and dementia.   Rhabdomyolysis Patient was found down in the bathroom after 3 days with a laceration and hematoma on his head. Patient has not had any of his medications in that time period. No acute fractures. CK is 956, K is 4.3 , GFR 36, Cr 1.64 (baseline 1.7)  DDX: consider vasovagal episode (patient was found in the bathroom between toilet and wall); fall with resulting weakness (hx of hospitalizations for recurrent falls). PT has recommended SNF and patient and patients sister are agreeable. Is able to have PO intake so will discontinue IVF. -Trend CK -Continue to monitor   AMS (multifactoral) Patient has baselinecognitive impairment and was oriented to himself only on exam. Unsure of patient baseline. Will contact sister to obtain more history. Admission labs: Etoh wnl, ammonia wnl  DDX: metabolic encephalopathy (less likely due to normal Etoh and ammonia levels), medication induced (patient is currently taking several psych medications).  Today was able to participate in conversation during interview. -Reconsult to Psych has been placed -Further history needs to be obtained to further evaluate possible causes of AMS. -Continue to monitor for worsening mental status   AKI onCKD  Patient is followed by nephrology at [facility].Patient admitted with Cr of 2.06, with a baseline of 1.7;  GFR 36. AKI likely secondary to rhabdomyolysis and dehydration. CK level today has improved to 956 and Creatinine has improved to 1.64 to baseline. Is able to have PO intake so will discontinue IVF. -Monitor urine output -Continue to monitor with CMP/BMP   HTN Patient has a history of HTN. Home medications include lisinopril-HCTZ 10-12.5 daily, patient reportedly not taking it currently. BP on admission 166/101. - Continue home lisinopril-HCTZ - Continue to monitor   Schizoaffective disorder, bipolartype Followed by psychiatry.Patient home medications include: Buspar 15mg  BID, Abilify 5mg  daily, depakote 1500mg  QHS, wellbutrin 300mg  daily. Psych consulted yesterday and they were unable to interview due to current state.asked to be reconsulted once patient able to participate in interview. Today was able to have conversation so will reconsult Psych. -Psych Reconsult placed -Continue home meds   T2DM HbA1C 7.9. Home medications are: Levemir 45U BID and Linagliptin 5mg  daily.According to prior documentation, patient has episodes of non-compliance with medications. Currently on Novolog sliding scale and Levemir 12 units BID. Glucose 147. -Continue Levemir 12 Units BID - sSSI - CBGs with meals and QHS   Mild cognitive impairment (d/t bipolar, PTSD, h/o substance abuse) Patient was previously diagnosed with early-onset demential in Nov 2019 with disorientation, confusion, and frequent falls. This diagnosis was questioned and these spells of confusion and memory loss thought to be related to psychiatry issues and metabolic derangements. Patient seen by neurology on 09/14/19 and diagnosed with mild cognitive impairment d/t bipolar disorder, PTSD, and hx of substance abuse. This may be contributing to the patient's AMS.  Today was able to hold conversation. - Continue to monitor   GERD, stable Patient taking omeprazole 40mg  at home. -Continue home meds   BPH, stable Patient  is currently taking cialis 5 mg daily and alfuzosin 10mg  daily, followed by urology. - Continue home meds   HDL Patient currently taking a statin, will hold due to rhabdomyolysis.   Degenerative disc disease, lumbar Currently taking tramadol, followed by spine surgeon.Home medications include: tramadol 50mg  QHS PRN, Robaxin 1000mg  BID,gabapentin 300mg  BID - Hold Robaxin (d/t rhabdo) - Continue tramadol and gabapentin   Hypogonadism Patient has a history of hypogonadism and is currently using testosterone gel 20.25mg . - Continue home testosterone gel   Chronic thrombocytopenia On admission, patient plt 124. In 04/2019, patient was noted to have thrombocytopenia in the 50s.  - Continue to monitor with CBC   FEN/GI: Carb modified PPx: Lovenox  Disposition: Med-Surg  Prior to Admission Living Arrangement: Home Anticipated Discharge Location: SNF Barriers to Discharge: Psychiatric problems Anticipated discharge in approximately 2-5 day(s).   Subjective:  Interviewed patient at bedside.  He reports that he is doing alright. He does not remember what happened during his fall or when it happened. He states his thinking has cleared up. He mentioned that he would need to call his sister. He also mentioned that she wants to have him "locked away." He was starting to eat breakfast while being interviewed.  Objective: Temp:  [98.2 F (36.8 C)-98.9 F (37.2 C)] 98.2 F (36.8 C) (09/14 0350) Pulse Rate:  [67-98] 67 (09/14 0350) Resp:  [16-18] 16 (09/14 0350) BP: (122-176)/(80-111) 146/84 (09/14 0350) SpO2:  [97 %-99 %] 97 % (09/14 0350) Weight:  [106.6 kg] 106.6 kg (09/13 2044) Physical Exam:  Physical Exam Vitals and nursing note reviewed.  Constitutional:      General: He is not in acute distress.    Appearance: Normal appearance. He is not ill-appearing or toxic-appearing.  Cardiovascular:     Rate and Rhythm: Normal rate and regular rhythm.     Pulses:  Normal pulses.          Radial pulses are 2+ on the right side and 2+ on the left side.       Dorsalis pedis pulses are 2+ on the right side and 2+ on the left side.     Heart sounds: Normal heart sounds, S1 normal and S2 normal. No murmur heard.   Pulmonary:     Effort: Pulmonary effort is normal. No respiratory distress.     Breath sounds: Wheezing (mild scattered) present.  Abdominal:     General: There is no distension.     Palpations: There is no mass.     Tenderness: There is no abdominal tenderness.  Musculoskeletal:     Right lower leg: No edema.     Left lower leg: No edema.  Neurological:     Mental Status: He is alert.  Psychiatric:        Attention and Perception: Attention normal.        Mood and Affect: Mood normal.        Speech: Speech normal.        Behavior: Behavior is cooperative.        Cognition and Memory: Memory is impaired.     Comments: Has tremor and repetitive movements with hands       Laboratory: Recent Labs  Lab 01/01/20 1520  WBC 8.0  HGB 13.9  HCT 41.2  PLT 124*   Recent Labs  Lab 01/01/20 1520 01/01/20 2151 01/02/20 0201 01/02/20 2219  NA 140  --  143 139  K 5.5*  --  4.3 4.2  CL 103  --  106 104  CO2 23  --  23 24  BUN 36*  --  41* 35*  CREATININE 2.06*  --  1.94* 1.64*  CALCIUM 9.8  --  9.4 8.9  PROT  --  7.4 7.0  --   BILITOT  --  1.1 0.8  --   ALKPHOS  --  95 81  --   ALT  --  20 16  --   AST  --  49* 47*  --   GLUCOSE 303*  --  266* 162*    CK: 956 A1C: 7.9   Imaging/Diagnostic Tests:   CT Cervical Spine Wo Contrast: No acute fracture or subluxation. Stable moderate to marked severity multilevel degenerative changes.   CT Head Wo Contrast: Generalized cerebral atrophy. No acute intracranial abnormality.   DG Pelvis Portable: Intact bilateral total hip replacements without postoperative changes seen within the lower lumbar spine. No acute osseous abnormality.   DG Chest Portable 1 View: No active  cardiopulmonary disease   Kai Levins Redgie Grayer, MD 01/03/2020, 5:43 AM PGY-1, [place] Intern pager: [phone], text pages welcome

## 2020-01-03 NOTE — TOC Progression Note (Signed)
Transition of Care [facility]) - Progression Note    Patient Details  Name: [name] MRN: [id number] Date of Birth: May 29, 1965  Transition of Care [facility]) CM/SW [place], [place] Phone Number: 01/03/2020, 12:14 PM  Clinical Narrative:    CSW left vm for sister concerning SNF choice.  Received Passr Level II [id number]  Approved starting 01/02/20 good for 30 days.     Expected Discharge Plan: Skilled Nursing Facility Barriers to Discharge: [place] [facility]), SNF Pending bed offer, Continued Medical Work up  Expected Discharge Plan and Services Expected Discharge Plan: [place] In-house Referral: Clinical Social Work   Post Acute Care Choice: [place] arrangements for the past 2 months: Single Family Home                                       Social Determinants of Health (SDOH) Interventions    Readmission Risk Interventions Readmission Risk Prevention Plan 01/02/2020  Transportation Screening Complete  PCP or Specialist Appt within 3-5 Days Complete  HRI or [place] Complete  Social Work Consult for [place] Planning/Counseling Complete  Palliative Care Screening Not Applicable  Medication Review [employer]) Referral to Pharmacy  Some recent data might be hidden

## 2020-01-03 NOTE — Hospital Course (Signed)
° °  Follow up Diabetes: consider adding SGLT-2 for mortality improvement

## 2020-01-03 NOTE — Consult Note (Signed)
[facility] Consult   Reason for Consult: Bipolar type, schizoaffective, possible medication management.  Patient is now able to interact with interview he does have a significant tremor but also has repetitive movements (possible EPS.) Referring Physician: Dr. Erin Hearing Patient Identification: [name] MRN:  [id number] Principal Diagnosis: schizoaffective disorder, bipolar type Diagnosis:   Patient Active Problem List   Diagnosis Date Noted  . Pressure injury of skin [L89.90] 01/02/2020  . Dehydration [E86.0]   . Hyperglycemia [R73.9]   . Rhabdomyolysis [M62.82] 01/01/2020  . Recurrent falls [R29.6] 05/15/2019  . AKI (acute kidney injury) ([place]) [N17.9] 05/14/2019  . Closed displaced fracture of lateral malleolus of left fibula [S82.62XA] 08/02/2017  . BPH (benign prostatic hyperplasia) [N40.0] 07/24/2017  . Hemorrhoid [K64.9] 05/27/2017  . Shoulder arthritis [M19.019] 05/27/2017  . Diabetic neuropathy ([place]) [E11.40] 05/27/2017  . Degenerative disc disease, lumbar [M51.36] 02/04/2017  . Constipation [K59.00]   . History of seizures [Z87.898] 09/20/2016  . Acute lower UTI [N39.0]   . Urinary retention [R33.9]   . HLD (hyperlipidemia) [E78.5] 09/07/2016  . GERD (gastroesophageal reflux disease) [K21.9] 09/07/2016  . PTSD (post-traumatic stress disorder) [F43.10] 09/07/2016  . Fall [W19.XXXA] 09/07/2016  . Chronic kidney disease [N18.9]   . Ataxia [R27.0] 10/14/2015  . Hypokalemia [E87.6] 10/03/2015  . Hemiparesis ([place]) [G81.90]   . Dyslipidemia [E78.5] 05/31/2014  . Insomnia [G47.00] 11/11/2013  . Degenerative joint disease (DJD) of hip [M16.9] 08/16/2013  . Acute midline low back pain without sciatica [M54.5] 05/20/2011  . Erectile dysfunction [N52.9] 02/17/2011  . Constipation - functional [K59.04] 02/17/2011  . Diabetes type 2, uncontrolled ([place]) [E11.65] 11/06/2010  . Hypogonadism male [E29.1] 05/20/2010  . TOBACCO USER [F17.200] 05/20/2010  .  Demoralization and apathy [R45.3] 05/20/2010  . Schizoaffective disorder, bipolar type ([place]) [F25.0] 01/18/2010  . Right shoulder pain [M25.511] 01/18/2010  . Anxiety state [F41.1] 12/05/2006  . Essential hypertension [I10] 12/05/2006    Total Time spent with patient: 45 minutes  Subjective:   [name] is a 54 y.o. male who presents with altered mental status and rhabdomyolysis after being found down for 3 days.  Patient reports significant history for schizoaffective, bipolar type, insomnia, and dementia.  He reports recently seeing a neurologist last week however visit is unable to be located.  Last visit noted by neurology was May 2021.  Per chart review he was diagnosed with early onset dementia secondary to his bipolar disorder in 2017.  Patient with history of memory and confusion spells, however has been stable on his medication.  He does report not having a psychiatrist and states" due to this health shit and Covid.  I cannot find 1.  I am guessing, have to go back to [place] but I hated there.  The people seem as though they do not care. "  Patient reports he is currently living alone, and does not wish to cause any further burden on his sister.  Patient is open to outpatient services.  He denies any recent suicide attempt.  He does report history of 2 previous suicide attempts both by overdose, 1 resulting in inpatient admission several years ago.    HPI:  Patient was found down in the bathroom after possibly 3 days surrounded by his own feces and urine. On arrival, BP elevated to 170/103 with tachycardia at 112. In ED, patient given a 1L NS bolus, and started on sodium bicarb 126mEq in 1L sterile water @125mL /hr. Admission labs: CK 2,275, K+ 5.5, GFR 36, Cr 2.06 (  baseline 1.9-2) U/A notable for large amounts of Hgb, glucosuria. UDS & etoh negative. CXR: no active cardiopulmonary disease. CT head: generalized cerebral atrophy (in previous CTs, noted to be advanced for age), no acute  intracranial abnormality. CT cervical spine: No acute fracture/subluxation, stable moderate to marked severity multilevel degenerative changes. X-ray Pelvis: intact b/l total hip replacements, no acute osseous abnormality. On physical exam, patient did not have any TTP of extremities suggesting fractures. Neurologically, he had no FND or CN deficits. 4/5 strength in upper extremities unable to assess lower extremities due to intermittent difficulty following commands.    Past Psychiatric History: schizoaffective disorder, bipolar type (patient report)  Risk to Self:  Denies Risk to Others:  Denies Prior Inpatient Therapy:  None recently, 2 previous suicide attempts Prior Outpatient Therapy:  Last seen at [facility], and is open to new outpatient provider.  Past Medical History:  Past Medical History:  Diagnosis Date  . ADD (attention deficit disorder)   . Anxiety   . Arthritis    right hip  . Bipolar 1 disorder ([place])   . Blood in urine   . CKD (chronic kidney disease), stage III   . Creatinine elevation   . Dementia ([place])    "early onset" (08/04/2017)  . Depression    bipolar [facility]  . Diabetes mellitus without complication ([place])   . Family history of anesthesia complication    pt is unsure , but pt father may have been difficult to arouse   . HCAP (healthcare-associated pneumonia) 10/31/2012  . History of kidney stones   . Hypertension   . Hypogonadism male   . Liver fatty degeneration   . Microscopic hematuria    hereditary s/p Urology eval  . Neuromuscular disorder ([place])    feet neuropathy   . Osteoarthritis of right hip 11/28/2011   2012 2015 s/p THR Severe  Dr Novella Olive    . Pleural effusion 11/02/2012  . Pneumonia 10-2012  . Pneumonia, organism unspecified(486) 11/02/2012  . Polysubstance dependence, non-opioid, in remission ([place])    remote  . Primary osteoarthritis of left hip 05/22/2015  . PTSD (post-traumatic stress disorder)    SOCIAL ANXIETY DISORDER   .  Schizoaffective disorder ([place])   . Substance abuse ([place])   . Suicide attempt by multiple drug overdose ([place]) 2016/01/05   Grieving his cat's death 07/22/2015    Past Surgical History:  Procedure Laterality Date  . BACK SURGERY    . CLOSED REDUCTION METACARPAL WITH PERCUTANEOUS PINNING Right   . LUMBAR DISC SURGERY    . TONSILLECTOMY    . TOTAL HIP ARTHROPLASTY Right 08/16/2013   Procedure: TOTAL HIP ARTHROPLASTY ANTERIOR APPROACH;  Surgeon: Hessie Dibble, MD;  Location: [facility];  Service: Orthopedics;  Laterality: Right;  . TOTAL HIP ARTHROPLASTY Left 05/22/2015   Procedure: TOTAL HIP ARTHROPLASTY ANTERIOR APPROACH;  Surgeon: Melrose Nakayama, MD;  Location: [facility];  Service: Orthopedics;  Laterality: Left;   Family History:  Family History  Problem Relation Age of Onset  . Diabetes Father   . Cancer Mother        died of melanoma with mets  . Cervical cancer Sister   . Diabetes Sister   . Other Neg Hx        hypogonadism  . Colon cancer Neg Hx   . Colon polyps Neg Hx   . Esophageal cancer Neg Hx   . Rectal cancer Neg Hx   . Stomach cancer Neg Hx    Family Psychiatric  History: none Social History:  Social History   Substance and Sexual Activity  Alcohol Use Not Currently     Social History   Substance and Sexual Activity  Drug Use No   Comment: hx of marijuana/cocaine/crack use but sober since 20's    Social History   Socioeconomic History  . Marital status: Single    Spouse name: Not on file  . Number of children: 0  . Years of education: Not on file  . Highest education level: Some college, no degree  Occupational History  . Occupation: disability  Tobacco Use  . Smoking status: Former Smoker    Years: 0.25    Types: Cigarettes    Quit date: 03/22/2019    Years since quitting: 0.7  . Smokeless tobacco: Never Used  Vaping Use  . Vaping Use: Never used  Substance and Sexual Activity  . Alcohol use: Not Currently  . Drug use: No    Comment: hx of  marijuana/cocaine/crack use but sober since 20's  . Sexual activity: Not Currently  Other Topics Concern  . Not on file  Social History Narrative   09/14/19 lives alone, sister [name] helps with meds, he has some in home care, lived with sister until Nov 2020   Caffeine- sodas, amount  varies   regular exercise-no   Social Determinants of Health   Financial Resource Strain:   . Difficulty of Paying Living Expenses: Not on file  Food Insecurity:   . Worried About Charity fundraiser in the Last Year: Not on file  . Ran Out of Food in the Last Year: Not on file  Transportation Needs:   . Lack of Transportation (Medical): Not on file  . Lack of Transportation (Non-Medical): Not on file  Physical Activity:   . Days of Exercise per Week: Not on file  . Minutes of Exercise per Session: Not on file  Stress:   . Feeling of Stress : Not on file  Social Connections:   . Frequency of Communication with Friends and Family: Not on file  . Frequency of Social Gatherings with Friends and Family: Not on file  . Attends Religious Services: Not on file  . Active Member of Clubs or Organizations: Not on file  . Attends Archivist Meetings: Not on file  . Marital Status: Not on file   Additional Social History: Lives alone in his apartment.  His sister comes by to check on him.    Allergies:   Allergies  Allergen Reactions  . Vicodin [Hydrocodone-Acetaminophen] Itching    Labs:  Results for orders placed or performed during the hospital encounter of 01/01/20 (from the past 48 hour(s))  CBG monitoring, ED     Status: Abnormal   Collection Time: 01/01/20  3:16 PM  Result Value Ref Range   Glucose-Capillary 294 (H) 70 - 99 mg/dL    Comment: Glucose reference range applies only to samples taken after fasting for at least 8 hours.   Comment 1 Notify RN    Comment 2 Document in Chart   CBC     Status: Abnormal   Collection Time: 01/01/20  3:20 PM  Result Value Ref Range   WBC 8.0 4.0  - 10.5 K/uL   RBC 4.73 4.22 - 5.81 MIL/uL   Hemoglobin 13.9 13.0 - 17.0 g/dL   HCT 41.2 39 - 52 %   MCV 87.1 80.0 - 100.0 fL   MCH 29.4 26.0 - 34.0 pg   MCHC 33.7 30.0 -  36.0 g/dL   RDW 13.2 11.5 - 15.5 %   Platelets 124 (L) 150 - 400 K/uL    Comment: Immature Platelet Fraction may be clinically indicated, consider ordering this additional test [id number]    nRBC 0.0 0.0 - 0.2 %    Comment: Performed at [facility], [place] [address], [place], [place] [address]  Basic metabolic panel     Status: Abnormal   Collection Time: 01/01/20  3:20 PM  Result Value Ref Range   Sodium 140 135 - 145 mmol/L   Potassium 5.5 (H) 3.5 - 5.1 mmol/L   Chloride 103 98 - 111 mmol/L   CO2 23 22 - 32 mmol/L   Glucose, Bld 303 (H) 70 - 99 mg/dL    Comment: Glucose reference range applies only to samples taken after fasting for at least 8 hours.   BUN 36 (H) 6 - 20 mg/dL   Creatinine, Ser 2.06 (H) 0.61 - 1.24 mg/dL   Calcium 9.8 8.9 - 10.3 mg/dL   GFR calc non Af Amer 36 (L) >60 mL/min   GFR calc Af Amer 42 (L) >60 mL/min   Anion gap 14 5 - 15    Comment: Performed at [facility] [address] [place], [place] [address]  CK     Status: Abnormal   Collection Time: 01/01/20  3:20 PM  Result Value Ref Range   Total CK 2,275 (H) 49.0 - 397.0 U/L    Comment: Performed at [facility], [place] [address] [place], [place] [address]  CBG monitoring, ED     Status: Abnormal   Collection Time: 01/01/20  9:50 PM  Result Value Ref Range   Glucose-Capillary 310 (H) 70 - 99 mg/dL    Comment: Glucose reference range applies only to samples taken after fasting for at least 8 hours.  SARS Coronavirus 2 by RT PCR (hospital order, performed in [facility] hospital lab) Nasopharyngeal Nasopharyngeal Swab     Status: None   Collection Time: 01/01/20  9:51 PM   Specimen: Nasopharyngeal Swab  Result Value Ref Range   SARS Coronavirus 2 NEGATIVE NEGATIVE    Comment: (NOTE) SARS-CoV-2 target nucleic  acids are NOT DETECTED.  The SARS-CoV-2 RNA is generally detectable in upper and lower respiratory specimens during the acute phase of infection. The lowest concentration of SARS-CoV-2 viral copies this assay can detect is 250 copies / mL. A negative result does not preclude SARS-CoV-2 infection and should not be used as the sole basis for treatment or other patient management decisions.  A negative result may occur with improper specimen collection / handling, submission of specimen other than nasopharyngeal swab, presence of viral mutation(s) within the areas targeted by this assay, and inadequate number of viral copies (<250 copies / mL). A negative result must be combined with clinical observations, patient history, and epidemiological information.  Fact Sheet for Patients:   StrictlyIdeas.no  Fact Sheet for Healthcare Providers: BankingDealers.co.za  This test is not yet approved or  cleared by the [place] FDA and has been authorized for detection and/or diagnosis of SARS-CoV-2 by FDA under an Emergency Use Authorization (EUA).  This EUA will remain in effect (meaning this test can be used) for the duration of the COVID-19 declaration under Section 564(b)(1) of the Act, 21 U.S.C. section 360bbb-3(b)(1), unless the authorization is terminated or revoked sooner.  Performed at [facility], [place] [address], [place], [place] [address]   Hepatic function panel  Status: Abnormal   Collection Time: 01/01/20  9:51 PM  Result Value Ref Range   Total Protein 7.4 6.5 - 8.1 g/dL   Albumin 3.7 3.5 - 5.0 g/dL   AST 49 (H) 15 - 41 U/L   ALT 20 0 - 44 U/L   Alkaline Phosphatase 95 38 - 126 U/L   Total Bilirubin 1.1 0.3 - 1.2 mg/dL   Bilirubin, Direct 0.1 0.0 - 0.2 mg/dL   Indirect Bilirubin 1.0 (H) 0.3 - 0.9 mg/dL    Comment: Performed at [facility] [address] [place], [place] [address]  Ammonia     Status: None    Collection Time: 01/01/20 10:34 PM  Result Value Ref Range   Ammonia 33 9 - 35 umol/L    Comment: Performed at [facility], [place] [address], [place], [place] [address]  Ethanol     Status: None   Collection Time: 01/01/20 10:34 PM  Result Value Ref Range   Alcohol, Ethyl (B) <10 <10 mg/dL    Comment: (NOTE) Lowest detectable limit for serum alcohol is 10 mg/dL.  For medical purposes only. Performed at [facility], [place] [address], [place], [place] [address]   Urinalysis, Routine w reflex microscopic     Status: Abnormal   Collection Time: 01/01/20 10:44 PM  Result Value Ref Range   Color, Urine YELLOW YELLOW   APPearance CLEAR CLEAR   Specific Gravity, Urine 1.028 1.005 - 1.030   pH 5.0 5.0 - 8.0   Glucose, UA >=500 (A) NEGATIVE mg/dL   Hgb urine dipstick LARGE (A) NEGATIVE   Bilirubin Urine NEGATIVE NEGATIVE   Ketones, ur 20 (A) NEGATIVE mg/dL   Protein, ur 100 (A) NEGATIVE mg/dL   Nitrite NEGATIVE NEGATIVE   Leukocytes,Ua NEGATIVE NEGATIVE   RBC / HPF 11-20 0 - 5 RBC/hpf   WBC, UA 0-5 0 - 5 WBC/hpf   Bacteria, UA NONE SEEN NONE SEEN    Comment: Performed at [facility] [address], [place], [place] [address]  Urine rapid drug screen (hosp performed)     Status: None   Collection Time: 01/01/20 10:44 PM  Result Value Ref Range   Opiates NONE DETECTED NONE DETECTED   Cocaine NONE DETECTED NONE DETECTED   Benzodiazepines NONE DETECTED NONE DETECTED   Amphetamines NONE DETECTED NONE DETECTED   Tetrahydrocannabinol NONE DETECTED NONE DETECTED   Barbiturates NONE DETECTED NONE DETECTED    Comment: (NOTE) DRUG SCREEN FOR MEDICAL PURPOSES ONLY.  IF CONFIRMATION IS NEEDED FOR ANY PURPOSE, NOTIFY LAB WITHIN 5 DAYS.  LOWEST DETECTABLE LIMITS FOR URINE DRUG SCREEN Drug Class                     Cutoff (ng/mL) Amphetamine and metabolites    1000 Barbiturate and metabolites    200 Benzodiazepine                 672 Tricyclics and metabolites      300 Opiates and metabolites        300 Cocaine and metabolites        300 THC                            50 Performed at [facility], [place] [address], [place], [place] [address]   CBG monitoring, ED     Status: Abnormal   Collection Time: 01/02/20  2:00 AM  Result Value Ref Range   Glucose-Capillary 258 (H) 70 - 99 mg/dL    Comment: Glucose reference range applies only to samples taken after fasting for at least 8 hours.  Comprehensive metabolic panel     Status: Abnormal   Collection Time: 01/02/20  2:01 AM  Result Value Ref Range   Sodium 143 135 - 145 mmol/L   Potassium 4.3 3.5 - 5.1 mmol/L   Chloride 106 98 - 111 mmol/L   CO2 23 22 - 32 mmol/L   Glucose, Bld 266 (H) 70 - 99 mg/dL    Comment: Glucose reference range applies only to samples taken after fasting for at least 8 hours.   BUN 41 (H) 6 - 20 mg/dL   Creatinine, Ser 1.94 (H) 0.61 - 1.24 mg/dL   Calcium 9.4 8.9 - 10.3 mg/dL   Total Protein 7.0 6.5 - 8.1 g/dL   Albumin 3.5 3.5 - 5.0 g/dL   AST 47 (H) 15 - 41 U/L   ALT 16 0 - 44 U/L   Alkaline Phosphatase 81 38 - 126 U/L   Total Bilirubin 0.8 0.3 - 1.2 mg/dL   GFR calc non Af Amer 39 (L) >60 mL/min   GFR calc Af Amer 45 (L) >60 mL/min   Anion gap 14 5 - 15    Comment: Performed at [facility] [address] [place], [place] [address]  CK     Status: Abnormal   Collection Time: 01/02/20  2:01 AM  Result Value Ref Range   Total CK 2,205 (H) 49.0 - 397.0 U/L    Comment: Performed at [facility], [place] [address] [place], [place] [address]  Phosphorus     Status: None   Collection Time: 01/02/20  2:01 AM  Result Value Ref Range   Phosphorus 3.3 2.5 - 4.6 mg/dL    Comment: Performed at [facility] [address] [place], [place] [address]  Magnesium     Status: None   Collection Time: 01/02/20  2:01 AM  Result Value Ref Range   Magnesium 2.0 1.7 - 2.4 mg/dL    Comment: Performed at [facility], [place] [address] [place],  [place] [address]  Glucose, capillary     Status: Abnormal   Collection Time: 01/02/20  7:16 AM  Result Value Ref Range   Glucose-Capillary 199 (H) 70 - 99 mg/dL    Comment: Glucose reference range applies only to samples taken after fasting for at least 8 hours.  Valproic acid level     Status: Abnormal   Collection Time: 01/02/20  7:38 AM  Result Value Ref Range   Valproic Acid Lvl 133 (H) 50.0 - 100.0 ug/mL    Comment: Performed at [facility] [address] [place], [place] [address]  Glucose, capillary     Status: Abnormal   Collection Time: 01/02/20 11:02 AM  Result Value Ref Range   Glucose-Capillary 235 (H) 70 - 99 mg/dL    Comment: Glucose reference range applies only to samples taken after fasting for at least 8 hours.  Glucose, capillary     Status: Abnormal   Collection Time: 01/02/20  4:30 PM  Result Value Ref Range   Glucose-Capillary 218 (H) 70 - 99 mg/dL    Comment: Glucose reference range applies only to samples taken after fasting for at least 8 hours.  Glucose, capillary     Status: Abnormal   Collection Time: 01/02/20  8:46 PM  Result Value Ref Range   Glucose-Capillary 160 (H) 70 - 99 mg/dL    Comment: Glucose reference range applies only to samples taken after fasting for at least 8 hours.  CK     Status: Abnormal   Collection Time: 01/02/20 10:19 PM  Result Value Ref Range   Total CK 956 (H) 49.0 - 397.0 U/L    Comment: Performed at [facility], [place] [address] [place], [place] [address]  Renal function panel     Status: Abnormal   Collection Time: 01/02/20 10:19 PM  Result Value Ref Range   Sodium 139 135 - 145 mmol/L   Potassium 4.2 3.5 - 5.1 mmol/L   Chloride 104 98 - 111 mmol/L   CO2 24 22 - 32 mmol/L   Glucose, Bld 162 (H) 70 - 99 mg/dL    Comment: Glucose reference range applies only to samples taken after fasting for at least 8 hours.   BUN 35 (H) 6 - 20 mg/dL   Creatinine, Ser 1.64 (H) 0.61 - 1.24 mg/dL   Calcium 8.9 8.9 - 10.3 mg/dL    Phosphorus 3.7 2.5 - 4.6 mg/dL   Albumin 3.0 (L) 3.5 - 5.0 g/dL   GFR calc non Af Amer 47 (L) >60 mL/min   GFR calc Af Amer 55 (L) >60 mL/min   Anion gap 11 5 - 15    Comment: Performed at [facility] [address] [place], [place] [address]  Glucose, capillary     Status: Abnormal   Collection Time: 01/03/20  6:41 AM  Result Value Ref Range   Glucose-Capillary 160 (H) 70 - 99 mg/dL    Comment: Glucose reference range applies only to samples taken after fasting for at least 8 hours.  Hemoglobin A1c     Status: Abnormal   Collection Time: 01/03/20  6:51 AM  Result Value Ref Range   Hgb A1c MFr Bld 7.9 (H) 4.8 - 5.6 %    Comment: (NOTE) Pre diabetes:          5.7%-6.4%  Diabetes:              >6.4%  Glycemic control for   <7.0% adults with diabetes    Mean Plasma Glucose 180.03 mg/dL    Comment: Performed at [facility] [address] [place], [place] [address]  Renal function panel     Status: Abnormal   Collection Time: 01/03/20  6:51 AM  Result Value Ref Range   Sodium 138 135 - 145 mmol/L   Potassium 4.7 3.5 - 5.1 mmol/L   Chloride 105 98 - 111 mmol/L   CO2 25 22 - 32 mmol/L   Glucose, Bld 147 (H) 70 - 99 mg/dL    Comment: Glucose reference range applies only to samples taken after fasting for at least 8 hours.   BUN 31 (H) 6 - 20 mg/dL   Creatinine, Ser 1.43 (H) 0.61 - 1.24 mg/dL   Calcium 9.1 8.9 - 10.3 mg/dL   Phosphorus 3.0 2.5 - 4.6 mg/dL   Albumin 3.0 (L) 3.5 - 5.0 g/dL   GFR calc non Af Amer 56 (L) >60 mL/min   GFR calc Af Amer >60 >60 mL/min   Anion gap 8 5 - 15    Comment: Performed at [facility] [address], [place], [place] [address]  Magnesium     Status: None   Collection Time: 01/03/20  6:51 AM  Result Value Ref Range  Magnesium 2.0 1.7 - 2.4 mg/dL    Comment: Performed at [facility], [place] [address] [place], [place] [address]  Glucose, capillary     Status: Abnormal   Collection Time: 01/03/20 11:24 AM  Result  Value Ref Range   Glucose-Capillary 138 (H) 70 - 99 mg/dL    Comment: Glucose reference range applies only to samples taken after fasting for at least 8 hours.    Current Facility-Administered Medications  Medication Dose Route Frequency Provider Last Rate Last Admin  . alfuzosin (UROXATRAL) 24 hr tablet 10 mg  10 mg Oral QHS Lilland, Alana, DO   10 mg at 01/02/20 2144  . ARIPiprazole (ABILIFY) tablet 5 mg  5 mg Oral Daily Lilland, Alana, DO   5 mg at 01/03/20 1022  . buPROPion (WELLBUTRIN XL) 24 hr tablet 300 mg  300 mg Oral Daily Lilland, Alana, DO   300 mg at 01/03/20 1019  . busPIRone (BUSPAR) tablet 15 mg  15 mg Oral BID Lilland, Alana, DO   15 mg at 01/03/20 1019  . enoxaparin (LOVENOX) injection 40 mg  40 mg Subcutaneous Daily Lilland, Alana, DO   40 mg at 01/03/20 1020  . fluticasone (FLONASE) 50 MCG/ACT nasal spray 1 spray  1 spray Each Nare Daily Lilland, Alana, DO   1 spray at 01/03/20 1023  . gabapentin (NEURONTIN) capsule 300 mg  300 mg Oral BID Lilland, Alana, DO   300 mg at 01/03/20 1018  . hydrochlorothiazide (MICROZIDE) capsule 12.5 mg  12.5 mg Oral Daily Lilland, Alana, DO   12.5 mg at 01/03/20 1019  . insulin aspart (novoLOG) injection 0-9 Units  0-9 Units Subcutaneous TID WC Lilland, Alana, DO   1 Units at 01/03/20 1300  . insulin detemir (LEVEMIR) injection 12 Units  12 Units Subcutaneous BID Briant Cedar, MD   12 Units at 01/03/20 1020  . lisinopril (ZESTRIL) tablet 10 mg  10 mg Oral Daily Lilland, Alana, DO   10 mg at 01/03/20 1019  . pantoprazole (PROTONIX) EC tablet 80 mg  80 mg Oral Daily Lilland, Alana, DO   80 mg at 01/03/20 1018  . tadalafil (CIALIS) tablet 5 mg  5 mg Oral Daily Lilland, Alana, DO   5 mg at 01/03/20 1021  . testosterone (ANDROGEL) 50 MG/5GM (1%) gel 2.5 g  2.5 g Transdermal q morning - 10a Lilland, Alana, DO   2.5 g at 01/03/20 1020  . traMADol (ULTRAM) tablet 50 mg  50 mg Oral QHS PRN Lilland, Alana, DO   50 mg at 01/02/20 0203  .  traZODone (DESYREL) tablet 150 mg  150 mg Oral QHS Lilland, Alana, DO   150 mg at 01/02/20 2144    Musculoskeletal: Strength & Muscle Tone: within normal limits Gait & Station: unsteady Patient leans: N/A  Psychiatric Specialty Exam: Physical Exam Constitutional:      Appearance: He is well-developed.  HENT:     Head: Normocephalic.  Pulmonary:     Effort: Pulmonary effort is normal.  Musculoskeletal:        General: Normal range of motion.     Cervical back: Normal range of motion.  Neurological:     Mental Status: He is alert and oriented to person, place, and time.  Psychiatric:        Mood and Affect: Mood is depressed.        Speech: Speech normal.        Behavior: Behavior normal.  Thought Content: Thought content normal.        Judgment: Judgment normal.     Review of Systems  Psychiatric/Behavioral: Positive for depression.  All other systems reviewed and are negative.   Blood pressure 133/86, pulse 73, temperature 99.2 F (37.3 C), temperature source Oral, resp. rate 16, weight 106.6 kg, SpO2 98 %.Body mass index is 27.87 kg/m.  General Appearance: Casual  Eye Contact:  Good  Speech:  Clear and Coherent  Volume:  Normal  Mood:  Depressed  Affect:  Congruent  Thought Process:  Coherent and Descriptions of Associations: Intact  Orientation:  Full (Time, Place, and Person)  Thought Content:  WDL and Logical  Suicidal Thoughts:  No  Homicidal Thoughts:  No  Memory:  Immediate;   Good Recent;   Good Remote;   Good  Judgement:  Good  Insight:  Good  Psychomotor Activity:  Normal  Concentration:  Concentration: Good and Attention Span: Good  Recall:  Good  Fund of Knowledge:  Good  Language:  Good  Akathisia:  No  Handed:  Right  AIMS (if indicated):     Assets:  Housing Leisure Time Resilience Social Support  ADL's:  Intact  Cognition:  WNL  Sleep:        Treatment Plan Summary: Daily contact with patient to assess and evaluate symptoms  and progress in treatment, Medication management and Plan schizoaffective disorder, bipolar type:  -Will hold Depakote 1500 mg, recent level obtained yesterday 913 was 133. -will continue current home medication.  -Will need repeat Depakote level on Thursday, may resume Depakote once level is within therapeutic range. -We will work closely with social work to schedule appointment at [facility] center.   Disposition: No evidence of imminent risk to self or others at present.   Patient does not meet criteria for psychiatric inpatient admission. Supportive therapy provided about ongoing stressors. Discussed crisis plan, support from social network, calling 911, coming to the Emergency Department, and calling Suicide Hotline.  Suella Broad, FNP 01/03/2020 2:11 PM  Review of Systems  Psychiatric/Behavioral: Positive for depression.  All other systems reviewed and are negative.

## 2020-01-03 NOTE — Progress Notes (Signed)
Called patient's sister to get collateral information about Mental history.  She reports that he has attempted suicide twice in his life, both by overdose. The last attempt was 3.5- 4 years ago.   Reports that his last scheduled appointment with a Psychiatrist was about a month or two ago. She states that it was a tele appointment.She does not know if it made that appointment or not because of hs memory issues he has forgot/missed tele appointments before.  She reports that he began to have episodes of hallucinations in his 24's and that was when he was diagnosed with Schizoaffective. She reports that he had both auditory and visual hallucinations. She reports that he reported to her similar symptoms around 6 months ago.  Discussed that Psych was consulted and we will await their recommendations for possible discharge.

## 2020-01-04 LAB — BASIC METABOLIC PANEL
Anion gap: 8 (ref 5–15)
BUN: 27 mg/dL — ABNORMAL HIGH (ref 6–20)
CO2: 26 mmol/L (ref 22–32)
Calcium: 9.1 mg/dL (ref 8.9–10.3)
Chloride: 100 mmol/L (ref 98–111)
Creatinine, Ser: 1.48 mg/dL — ABNORMAL HIGH (ref 0.61–1.24)
GFR calc Af Amer: >60 mL/min (ref >60)
GFR calc non Af Amer: 54 mL/min — ABNORMAL LOW (ref >60)
Glucose, Bld: 210 mg/dL — ABNORMAL HIGH (ref 70–99)
Potassium: 3.9 mmol/L (ref 3.5–5.1)
Sodium: 134 mmol/L — ABNORMAL LOW (ref 135–145)

## 2020-01-04 LAB — GLUCOSE, CAPILLARY
Glucose-Capillary: 167 mg/dL — ABNORMAL HIGH (ref 70–99)
Glucose-Capillary: 217 mg/dL — ABNORMAL HIGH (ref 70–99)
Glucose-Capillary: 215 mg/dL — ABNORMAL HIGH (ref 70–99)
Glucose-Capillary: 166 mg/dL — ABNORMAL HIGH (ref 70–99)

## 2020-01-04 LAB — SARS CORONAVIRUS 2 (TAT 6-24 HRS): SARS Coronavirus 2: NEGATIVE

## 2020-01-04 NOTE — Progress Notes (Signed)
Family Medicine Teaching Service Daily Progress Note Intern Pager: [phone]  Patient name: [name] Medical record number: [id number] Date of birth: 10-May-1965 Age: 54 y.o. Gender: male  Primary Care Provider: [name], MD Consultants: Psych Code Status: Full  Pt Overview and Major Events to Date:  Admitted on 9/12  Assessment and Plan:  [name] a 54 y.o.malepresenting with AMS and rhabdomyolysis after found down x3 days. PMH is significant forT2DM,Schizoaffective Disorder:Bipolartype, degenerative disc disease with radiculopathy, insomnia, and dementia.   Rhabdomyolysis Patient was found down in the bathroom after 3 days with a laceration and hematoma on his head. Patient has not had any of his medications in that time period. No acute fractures. [id number], Kis 4.3, GFR 36, Cr 1.64(baseline 1.7)  DDX: consider vasovagal episode (patient was found in the bathroom between toilet and wall); fall with resulting weakness (hx of hospitalizations for recurrent falls). PT has recommended SNF and patient and patients sister are agreeable. Is able to have PO intake so will discontinue IVF. CK was down trending, will recheck tomorrow now that he has had good oral intake for past 2 days -Recheck CK tomorrow -Continue to monitor   AMS(multifactoral)(resolved) Patient has baselinecognitive impairment and was oriented to himself only on exam.Unsure of patient baseline.Will contact sister to obtain more history. Admission labs: Etoh wnl, ammonia wnl  DDX: metabolic encephalopathy (less likely due to normal Etoh and ammonia levels), medication induced (patient is currently taking several psych medications). Today was able to participate in conversation during interview. He had good insight into current situation and wanting rehab to be stronger to go to rehab.  -Psych recommendations appreciated.. -Continue to monitor for worsening mental status   AKI  onCKD  Patient is followed by nephrology at [facility].Patient admitted with Cr of 2.06, with a baseline of 1.7; GFR 36. AKI likely secondary to rhabdomyolysis and dehydration. CK leveltoday has improved to 956 and Creatinine has improved to 1.43 to baseline. Is able to have PO intake so will discontinue IVF. -Monitor urine output -Continue to monitor with CMP/BMP   HTN Patient has a history of HTN. Home medications include lisinopril-HCTZ 10-12.5 daily, patient reportedly not taking it currently. BP on admission 166/101. - Continue home lisinopril-HCTZ - Continue to monitor   Schizoaffective disorder, bipolartype Followed by psychiatry.Patient home medications include: Buspar 15mg  BID, Abilify 5mg  daily, depakote 1500mg  QHS, wellbutrin 300mg  daily. Psych was able to conduct interview yesterday and recommends rechecking Depakote level tomorrow before restarting. They will work with social work to get [facility] center appointment. -Psych recommendations appreciated -Continue Buspar, Abilify, and Wellbutrin -Hold Depakote until level can be remeasured tomorrow   T2DM HbA1C 7.9. Home medications are: Levemir 45U BID and Linagliptin 5mg  daily.According to prior documentation, patient has episodes of non-compliance with medications. Currently on Novolog sliding scale and Levemir 12 units BID. Glucose 167. -Continue Levemir 12 Units BID - sSSI - CBGs with meals and QHS   Mild cognitive impairment (d/t bipolar, PTSD, h/o substance abuse) Patient was previously diagnosed with early-onset demential in Nov 2019 with disorientation, confusion, and frequent falls. This diagnosis was questioned and these spells of confusion and memory loss thought to be related to psychiatry issues and metabolic derangements. Patient seen by neurology on 09/14/19 and diagnosed with mild cognitive impairment d/t bipolar disorder, PTSD, and hx of substance abuse. This  may be contributing to the patient's AMS.    Today was able to hold conversation and showed no signs of  confusion. - Continue to monitor   GERD, stable Patient taking omeprazole 40mg  at home. -Continue home meds   BPH, stable Patient is currently taking cialis 5 mg daily and alfuzosin 10mg  daily, followed by urology. - Continue home meds   HDL Patient currently taking a statin, will hold due to rhabdomyolysis.   Degenerative disc disease, lumbar Currently taking tramadol, followed by spine surgeon.Home medications include: tramadol 50mg  QHS PRN, Robaxin 1000mg  BID,gabapentin 300mg  BID - Hold Robaxin (d/t rhabdo) - Continue tramadol and gabapentin   Hypogonadism Patient has a history of hypogonadism and is currently using testosterone gel 20.25mg . - Continue home testosterone gel   Chronic thrombocytopenia On admission, patient plt 124. In 04/2019, patient was noted to have thrombocytopenia in the 50s.  - Continue to monitor with CBC  FEN/GI: carb modified PPx: Lovenox  Disposition: Med-Surg  Prior to Admission Living Arrangement: Home Anticipated Discharge Location: SNF Barriers to Discharge: Psychiatric Stabilization Anticipated discharge in approximately 1-3 day(s).   Subjective:  Interviewed patient at bedside.  He reports doing well and having no complaints. He is willing to go to rehab for a short period before returning home. He had questions about locations and cost. Told him I will have Social Work come to talk with him today to answer any questions he has.  Objective: Temp:  [98.4 F (36.9 C)-99.2 F (37.3 C)] 98.6 F (37 C) (09/15 0500) Pulse Rate:  [73-110] 87 (09/15 0500) Resp:  [16-17] 17 (09/15 0500) BP: (123-137)/(72-101) 137/85 (09/15 0500) SpO2:  [97 %-100 %] 97 % (09/15 0500) Weight:  [106.6 kg] 106.6 kg (09/14 2133) Physical Exam:  Physical Exam Vitals and nursing note reviewed.  Constitutional:      General: He is  not in acute distress.    Appearance: Normal appearance. He is not ill-appearing or toxic-appearing.  HENT:     Head: Normocephalic.  Cardiovascular:     Rate and Rhythm: Normal rate and regular rhythm.     Pulses: Normal pulses.          Radial pulses are 2+ on the right side and 2+ on the left side.       Dorsalis pedis pulses are 2+ on the right side and 2+ on the left side.     Heart sounds: Normal heart sounds, S1 normal and S2 normal. No murmur heard.   Pulmonary:     Effort: Pulmonary effort is normal. No respiratory distress.     Breath sounds: Normal breath sounds. No wheezing.  Musculoskeletal:     Right lower leg: No edema.     Left lower leg: No edema.  Neurological:     Mental Status: He is alert. Mental status is at baseline.     Comments: Has moderate tremor   Psychiatric:        Mood and Affect: Mood normal.        Thought Content: Thought content normal.      Laboratory: Recent Labs  Lab 01/01/20 1520  WBC 8.0  HGB 13.9  HCT 41.2  PLT 124*   Recent Labs  Lab 01/01/20 1520 01/01/20 2151 01/02/20 0201 01/02/20 2219 01/03/20 0651  NA   < >  --  143 139 138  K   < >  --  4.3 4.2 4.7  CL   < >  --  106 104 105  CO2   < >  --  23 24 25   BUN   < >  --  41* 35* 31*  CREATININE   < >  --  1.94* 1.64* 1.43*  CALCIUM   < >  --  9.4 8.9 9.1  PROT  --  7.4 7.0  --   --   BILITOT  --  1.1 0.8  --   --   ALKPHOS  --  95 81  --   --   ALT  --  20 16  --   --   AST  --  49* 47*  --   --   GLUCOSE   < >  --  266* 162* 147*   < > = values in this interval not displayed.      Imaging/Diagnostic Tests: None  Briant Cedar, MD 01/04/2020, 5:45 AM PGY-1, [place] Intern pager: [phone], text pages welcome

## 2020-01-04 NOTE — Progress Notes (Signed)
Patient remains incontinent of bowel and bladder and stated that he can not use the urinal, it does not work for him. Patient has had urine and bowel incontinence multiple times and requested that he be placed back with the condom catheter. RN has notified MD and administered patient education.

## 2020-01-05 DIAGNOSIS — M792 Neuralgia and neuritis, unspecified: Secondary | ICD-10-CM | POA: Diagnosis not present

## 2020-01-05 DIAGNOSIS — F413 Other mixed anxiety disorders: Secondary | ICD-10-CM | POA: Diagnosis not present

## 2020-01-05 DIAGNOSIS — T796XXD Traumatic ischemia of muscle, subsequent encounter: Secondary | ICD-10-CM | POA: Diagnosis not present

## 2020-01-05 DIAGNOSIS — Z7401 Bed confinement status: Secondary | ICD-10-CM | POA: Diagnosis not present

## 2020-01-05 DIAGNOSIS — I10 Essential (primary) hypertension: Secondary | ICD-10-CM | POA: Diagnosis not present

## 2020-01-05 DIAGNOSIS — M47816 Spondylosis without myelopathy or radiculopathy, lumbar region: Secondary | ICD-10-CM | POA: Diagnosis not present

## 2020-01-05 DIAGNOSIS — R410 Disorientation, unspecified: Secondary | ICD-10-CM | POA: Diagnosis not present

## 2020-01-05 DIAGNOSIS — F419 Anxiety disorder, unspecified: Secondary | ICD-10-CM | POA: Diagnosis not present

## 2020-01-05 DIAGNOSIS — M2550 Pain in unspecified joint: Secondary | ICD-10-CM | POA: Diagnosis not present

## 2020-01-05 DIAGNOSIS — E1340 Other specified diabetes mellitus with diabetic neuropathy, unspecified: Secondary | ICD-10-CM | POA: Diagnosis not present

## 2020-01-05 DIAGNOSIS — K219 Gastro-esophageal reflux disease without esophagitis: Secondary | ICD-10-CM | POA: Diagnosis not present

## 2020-01-05 DIAGNOSIS — F339 Major depressive disorder, recurrent, unspecified: Secondary | ICD-10-CM | POA: Diagnosis not present

## 2020-01-05 DIAGNOSIS — R488 Other symbolic dysfunctions: Secondary | ICD-10-CM | POA: Diagnosis not present

## 2020-01-05 DIAGNOSIS — E1140 Type 2 diabetes mellitus with diabetic neuropathy, unspecified: Secondary | ICD-10-CM | POA: Diagnosis not present

## 2020-01-05 DIAGNOSIS — W19XXXA Unspecified fall, initial encounter: Secondary | ICD-10-CM | POA: Diagnosis not present

## 2020-01-05 DIAGNOSIS — G4700 Insomnia, unspecified: Secondary | ICD-10-CM | POA: Diagnosis not present

## 2020-01-05 DIAGNOSIS — F32A Depression, unspecified: Secondary | ICD-10-CM | POA: Diagnosis not present

## 2020-01-05 DIAGNOSIS — D696 Thrombocytopenia, unspecified: Secondary | ICD-10-CM | POA: Diagnosis not present

## 2020-01-05 DIAGNOSIS — N400 Enlarged prostate without lower urinary tract symptoms: Secondary | ICD-10-CM | POA: Diagnosis not present

## 2020-01-05 DIAGNOSIS — F250 Schizoaffective disorder, bipolar type: Secondary | ICD-10-CM | POA: Diagnosis not present

## 2020-01-05 DIAGNOSIS — N189 Chronic kidney disease, unspecified: Secondary | ICD-10-CM | POA: Diagnosis not present

## 2020-01-05 DIAGNOSIS — R4182 Altered mental status, unspecified: Secondary | ICD-10-CM | POA: Diagnosis not present

## 2020-01-05 DIAGNOSIS — F319 Bipolar disorder, unspecified: Secondary | ICD-10-CM | POA: Diagnosis not present

## 2020-01-05 DIAGNOSIS — R531 Weakness: Secondary | ICD-10-CM | POA: Diagnosis not present

## 2020-01-05 DIAGNOSIS — N179 Acute kidney failure, unspecified: Secondary | ICD-10-CM | POA: Diagnosis not present

## 2020-01-05 DIAGNOSIS — F259 Schizoaffective disorder, unspecified: Secondary | ICD-10-CM | POA: Diagnosis not present

## 2020-01-05 DIAGNOSIS — M6282 Rhabdomyolysis: Secondary | ICD-10-CM | POA: Diagnosis not present

## 2020-01-05 DIAGNOSIS — E119 Type 2 diabetes mellitus without complications: Secondary | ICD-10-CM | POA: Diagnosis not present

## 2020-01-05 LAB — VALPROIC ACID LEVEL: Valproic Acid Lvl: <10 ug/mL — ABNORMAL LOW (ref 50.0–100.0)

## 2020-01-05 LAB — GLUCOSE, CAPILLARY
Glucose-Capillary: 176 mg/dL — ABNORMAL HIGH (ref 70–99)
Glucose-Capillary: 206 mg/dL — ABNORMAL HIGH (ref 70–99)

## 2020-01-05 MED ORDER — TRAMADOL HCL 50 MG PO TABS: 50.0000 mg | ORAL_TABLET | Freq: Every evening | ORAL | 1 refills | Status: DC | PRN

## 2020-01-05 MED ORDER — DIVALPROEX SODIUM 500 MG PO DR TAB: 1500.0000 mg | DELAYED_RELEASE_TABLET | Freq: Every day | ORAL | Status: DC

## 2020-01-05 NOTE — Progress Notes (Signed)
Family Medicine Teaching Service Daily Progress Note Intern Pager: [phone]  Patient name: [name] Medical record number: [id number] Date of birth: 1965/04/25 Age: 54 y.o. Gender: male  Primary Care Provider: [name], MD Consultants: Psych Code Status: Full  Pt Overview and Major Events to Date:  Admitted on 9/12  Assessment and Plan:  [name] a 54 y.o.malepresenting with AMS and rhabdomyolysis after found down x3 days. PMH is significant forT2DM,Schizoaffective Disorder:Bipolartype, degenerative disc disease with radiculopathy, insomnia, and dementia.   Rhabdomyolysis (resolved) Patient was found down in the bathroom after 3 days with a laceration and hematoma on his head. Patient has not had any of his medications in that time period. No acute fractures.[id number], Kis 4.3, GFR 36, Cr 1.64(baseline 1.7)  DDX: consider vasovagal episode (patient was found in the bathroom between toilet and wall); fall with resulting weakness (hx of hospitalizations for recurrent falls). PT has recommended SNF and patient and patients sister are agreeable.Is able to have PO intake so will discontinue IVF. CK was down trending, will recheck tomorrow now that he has had good oral intake for past 2 days -Continue to monitorfor changes   AMS(multifactoral)(resolved) Patient has baselinecognitive impairment and was oriented to himself only on exam.Unsure of patient baseline.Will contact sister to obtain more history. Admission labs: Etoh wnl, ammonia wnl  DDX: metabolic encephalopathy (less likely due to normal Etoh and ammonia levels), medication induced (patient is currently taking several psych medications). Today was able to participate in conversation during interview. He had good insight into current situation and wanting rehab to be stronger to go to rehab. -Psych recommendations appreciated.. -Continue to monitor for worsening mental status   AKI  (resolved) onCKD  Patient is followed by nephrology at [facility].Patient admitted with Cr of 2.06, with a baseline of 1.7; GFR 36. AKI likely secondary to rhabdomyolysis and dehydration. CK leveltodayhas improved to 956 and Creatinine has improved to 1.43 to baseline.Is able to have PO intake so will discontinue IVF. -Monitor urine output -Continue to monitor with CMP/BMP   HTN Patient has a history of HTN. Home medications include lisinopril-HCTZ 10-12.5 daily, patient reportedly not taking it currently. BP on admission 166/101. - Continue home lisinopril-HCTZ - Continue to monitor   Schizoaffective disorder, bipolartype Followed by psychiatry.Patient home medications include: Buspar 15mg  BID, Abilify 5mg  daily, depakote 1500mg  QHS, wellbutrin 300mg  daily.Psychiatry will work with social work to get [facility] center appointment. Depakote level was < 10. Will restart Depakote 1500 mg QHS -Psychrecommendations appreciated -Continue Buspar, Abilify, and Wellbutrin -Restart Depakote 1500 mg QHS   T2DM HbA1C7.9. Home medications JSH:FWYOVZC 45U BIDandLinagliptin 5mg  daily.According to prior documentation, patient has episodes of non-compliance with medications.CurrentlyonNovolog sliding scale and Levemir 12 units BID. Glucose 166. -Continue Levemir 12 Units BID - sSSI - CBGs with meals and QHS   Mild cognitive impairment (d/t bipolar, PTSD, h/o substance abuse) Patient was previously diagnosed with early-onset demential in Nov 2019 with disorientation, confusion, and frequent falls. This diagnosis was questioned and these spells of confusion and memory loss thought to be related to psychiatry issues and metabolic derangements. Patient seen by neurology on 09/14/19 and diagnosed with mild cognitive impairment d/t bipolar disorder, PTSD, and hx of substance abuse. This may be contributing to the patient's AMS.  Today was able to hold  conversation and showed no signs of confusion. - Continue to monitor   GERD, stable Patient taking omeprazole 40mg  at home. -Continue home meds   BPH, stable Patient  is currently taking cialis 5 mg daily and alfuzosin 10mg  daily, followed by urology. - Continue home meds   HDL Patient currently taking a statin, will hold due to rhabdomyolysis.   Degenerative disc disease, lumbar Currently taking tramadol, followed by spine surgeon.Home medications include: tramadol 50mg  QHS PRN, Robaxin 1000mg  BID,gabapentin 300mg  BID - Hold Robaxin (d/t rhabdo) - Continue tramadol and gabapentin   Hypogonadism Patient has a history of hypogonadism and is currently using testosterone gel 20.25mg . - Continue home testosterone gel   Chronic thrombocytopenia On admission, patient plt 124. In 04/2019, patient was noted to have thrombocytopenia in the 50s.  - Continue to monitor with CBC   FEN/GI: carb modified PPx: Lovenox  Disposition: Med-Surg,  Medical stable  Prior to Admission Living Arrangement: Home Anticipated Discharge Location: SNF Barriers to Discharge: SNF placement Anticipated discharge in approximately 0-2 day(s).   Subjective:  Interviewed patient at bedside.  He reports doing well today. He has no complaints. Discussed plan for today is discharge to SNF.  Objective: Temp:  [98.1 F (36.7 C)-98.7 F (37.1 C)] 98.1 F (36.7 C) (09/15 2023) Pulse Rate:  [84-91] 84 (09/15 2023) Resp:  [18] 18 (09/15 2023) BP: (118-154)/(72-85) 118/72 (09/15 2023) SpO2:  [97 %] 97 % (09/15 1654) Physical Exam:  Physical Exam Vitals and nursing note reviewed.  Constitutional:      General: He is not in acute distress.    Appearance: Normal appearance. He is not ill-appearing, toxic-appearing or diaphoretic.  HENT:     Head: Normocephalic.  Cardiovascular:     Rate and Rhythm: Normal rate and regular rhythm.     Pulses: Normal pulses.          Radial pulses are 2+ on the  right side and 2+ on the left side.       Dorsalis pedis pulses are 2+ on the right side and 2+ on the left side.     Heart sounds: Normal heart sounds, S1 normal and S2 normal. No murmur heard.   Pulmonary:     Effort: Pulmonary effort is normal. No respiratory distress.     Breath sounds: Normal breath sounds. No wheezing.  Abdominal:     General: There is no distension.     Tenderness: There is no abdominal tenderness.  Musculoskeletal:     Right lower leg: No edema.     Left lower leg: No edema.  Neurological:     Mental Status: He is alert.      Laboratory: Recent Labs  Lab 01/01/20 1520  WBC 8.0  HGB 13.9  HCT 41.2  PLT 124*   Recent Labs  Lab 01/01/20 1520 01/01/20 2151 01/02/20 0201 01/02/20 0201 01/02/20 2219 01/03/20 0651 01/04/20 0844  NA   < >  --  143   < > 139 138 134*  K   < >  --  4.3   < > 4.2 4.7 3.9  CL   < >  --  106   < > 104 105 100  CO2   < >  --  23   < > 24 25 26   BUN   < >  --  41*   < > 35* 31* 27*  CREATININE   < >  --  1.94*   < > 1.64* 1.43* 1.48*  CALCIUM   < >  --  9.4   < > 8.9 9.1 9.1  PROT  --  7.4 7.0  --   --   --   --  BILITOT  --  1.1 0.8  --   --   --   --   ALKPHOS  --  95 81  --   --   --   --   ALT  --  20 16  --   --   --   --   AST  --  49* 47*  --   --   --   --   GLUCOSE   < >  --  266*   < > 162* 147* 210*   < > = values in this interval not displayed.    Valproic acid: <10 (low)  Imaging/Diagnostic Tests:  CT Cervical Spine Wo Contrast:No acute fracture or subluxation. Stable moderate to marked severity multilevel degenerative changes.   CT Head Wo Contrast:Generalized cerebral atrophy. No acute intracranial abnormality.   DG Pelvis Portable:Intact bilateral total hip replacements without postoperative changes seen within the lower lumbar spine. No acute osseous abnormality.   DG Chest Portable 1 View:No active cardiopulmonary disease.  Briant Cedar, MD 01/05/2020, 6:23 AM PGY-1,  [place] Intern pager: [phone], text pages welcome

## 2020-01-05 NOTE — Plan of Care (Signed)
  Problem: Nutrition: Goal: Adequate nutrition will be maintained Outcome: Progressing   

## 2020-01-05 NOTE — TOC Transition Note (Signed)
Transition of Care [facility]) - CM/SW Discharge Note   Patient Details  Name: [name] MRN: [id number] Date of Birth: 12-03-1965  [facility] of Care [facility]) CM/SW Contact:  Benard Halsted, LCSW Phone Number: 01/05/2020, 1:39 PM   Clinical Narrative:    Patient will DC to: [facility] Anticipated DC date: 01/05/20 Family notified: Sister, [name] Transport by: Glenna Fellows   Per MD patient ready for DC to [facility]. RN to call report prior to discharge [phone]). RN, patient, patient's family, and facility notified of DC. Discharge Summary and FL2 sent to facility. DC packet on chart. Ambulance transport requested for patient.   CSW will sign off for now as social work intervention is no longer needed. Please consult [place] again if new needs arise.      Final next level of care: Skilled Nursing Facility Barriers to Discharge: Barriers Resolved   Patient Goals and CMS Choice Patient states their goals for this hospitalization and ongoing recovery are:: Rehab CMS Medicare.gov Compare Post Acute Care list provided to:: Patient Represenative (must comment) (HCPOA, sister) Choice offered to / list presented to : [facility] POA / Guardian, Sibling (Sister, [name])  Discharge Placement   Existing PASRR number confirmed : 01/05/20          Patient chooses bed at: [facility] Patient to be transferred to facility by: [place] Name of family member notified: Sister, [name] Patient and family notified of of transfer: 01/05/20  Discharge Plan and Services In-house Referral: Clinical Social Work   Post Acute Care Choice: [place]                               Social Determinants of Health (SDOH) Interventions     Readmission Risk Interventions Readmission Risk Prevention Plan 01/02/2020  Transportation Screening Complete  PCP or Specialist Appt within 3-5 Days Complete  HRI or [place] Complete  Social Work Consult for [place]  Planning/Counseling [place] Screening Not Applicable  Medication Review [employer]) Referral to Pharmacy  Some recent data might be hidden

## 2020-01-05 NOTE — Progress Notes (Signed)
Physical Therapy Treatment Patient Details Name: [name] MRN: [id number] DOB: 1965/07/11 Today's Date: 01/05/2020    History of Present Illness 54 y.o. male presenting with AMS and rhabdomyolysis after found down x3 days. PMH is significant for T2DM, Bipolar disorder, degenerative disc disease with radiculopathy, insomnia, and dementia.    PT Comments    The pt was in bed upon arrival of PT, and agreeable to session with focus on progression of OOB mobility. The pt was able to demo significant improvement in tolerance for upright activity and increased alertness compared to prior sessions, and was able to complete a short bout of ambulation in the room with use of a RW and minG for safety. The pt was able to complete transfers with minA to steady with RW. The pt benefits from cues for gait pattern, use of RW, and minG-minA for stability with dynamic mobility. He will continue to benefit from skilled PT to progress functional mobility and dynamic stability to reduce risk of falls and fall-related injuries following d/c.     Follow Up Recommendations  SNF;Supervision/Assistance - 24 hour     Equipment Recommendations   (defer to post acute)    Recommendations for Other Services       Precautions / Restrictions Precautions Precautions: Fall Precaution Comments: admitted for fall Restrictions Other Position/Activity Restrictions: AMS    Mobility  Bed Mobility Overal bed mobility: Needs Assistance Bed Mobility: Supine to Sit     Supine to sit: Min assist     General bed mobility comments: minA to raise trunk from [facility] and minA to steady in sitting. Once pt repositioned at EOB (independently) no assist needed to maintain  Transfers Overall transfer level: Needs assistance Equipment used: Rolling walker (2 wheeled) Transfers: Sit to/from Stand Sit to Stand: Min guard         General transfer comment: minG for safety, no physical assist to power up, cues for hand  positioning.  Ambulation/Gait Ambulation/Gait assistance: Min assist Gait Distance (Feet): 25 Feet Assistive device: Rolling walker (2 wheeled) Gait Pattern/deviations: Step-to pattern;Decreased stride length;Decreased step length - right;Trunk flexed;Narrow base of support Gait velocity: decreased   General Gait Details: Pt with slow, step-to pattern initially. Able to progress to step-through with continuous movement of the RW with continued ambulation. The pt does have some difficulty with fluidity of RLE movement and typically walks with inconsistent step lengths and widths with the RLE, and increased time to place the foot.     Balance Overall balance assessment: Needs assistance Sitting-balance support: Bilateral upper extremity supported;Feet supported Sitting balance-Leahy Scale: Poor Sitting balance - Comments: initially requiring minA and BUE support, progressed to minG with BUE support Postural control: Right lateral lean Standing balance support: Bilateral upper extremity supported;During functional activity Standing balance-Leahy Scale: Poor Standing balance comment: reliant on BUE support                            Cognition Arousal/Alertness: Awake/alert Behavior During Therapy: WFL for tasks assessed/performed Overall Cognitive Status: History of cognitive impairments - at baseline                                 General Comments: pt able to follow commands well through the session, answered all questions appropriately and was able to self-report needs and wants      Exercises      General Comments  General comments (skin integrity, edema, etc.): small laceration on the top of his head. BP: 127/84 in supine, low of 93/66 after prolonged stand. pt reports no sx      Pertinent Vitals/Pain Pain Assessment: No/denies pain Pain Intervention(s): Limited activity within patient's tolerance;Monitored during session           PT Goals  (current goals can now be found in the care plan section) Acute Rehab PT Goals Patient Stated Goal: unable to state PT Goal Formulation: With patient Time For Goal Achievement: 01/16/20 Potential to Achieve Goals: Good Progress towards PT goals: Progressing toward goals    Frequency    Min 2X/week      PT Plan Current plan remains appropriate       AM-PAC PT "6 Clicks" Mobility   Outcome Measure  Help needed turning from your back to your side while in a flat bed without using bedrails?: None Help needed moving from lying on your back to sitting on the side of a flat bed without using bedrails?: A Little Help needed moving to and from a bed to a chair (including a wheelchair)?: A Lot Help needed standing up from a chair using your arms (e.g., wheelchair or bedside chair)?: A Little Help needed to walk in hospital room?: A Lot Help needed climbing 3-5 steps with a railing? : A Lot 6 Click Score: 16    End of Session Equipment Utilized During Treatment: Gait belt Activity Tolerance: Patient tolerated treatment well Patient left: with call bell/phone within reach;in chair;with chair alarm set Nurse Communication: Mobility status PT Visit Diagnosis: Muscle weakness (generalized) (M62.81);Difficulty in walking, not elsewhere classified (R26.2)     Time: 3790-2409 PT Time Calculation (min) (ACUTE ONLY): 29 min  Charges:  $Gait Training: 23-37 mins                     Karma Ganja, PT, DPT   Acute Rehabilitation Department Pager #: [phone]   Otho Bellows 01/05/2020, 12:48 PM

## 2020-01-05 NOTE — Care Management Important Message (Signed)
Important Message  Patient Details  Name: [name] MRN: [id number] Date of Birth: [birth year]   Medicare Important Message Given:  Yes - Important Message mailed due to current National Emergency  Verbal consent obtained due to current National Emergency  Relationship to patient: Self Contact Name: Tibor Lemmons Call Date: 01/05/20  Time: 0924 Phone: [phone] Outcome: Spoke with contact Important Message mailed to: Patient address on file    Delorse Lek 01/05/2020, 9:24 AM

## 2020-01-05 NOTE — Progress Notes (Signed)
NURSING PROGRESS NOTE  NAVI ERBER [id number] Discharge Data: 01/05/2020 2:58 PM Attending Provider: Kinnie Feil, MD CHY:IFOYDX, Charlane Ferretti, MD     [name] to be D/C'd SNF per MD order.  Discussed with the patient the After Visit Summary and all questions fully answered. All IV's discontinued with no bleeding noted. All belongings returned to patient for patient to take home.   Last Vital Signs:  Blood pressure (!) 142/80, pulse 78, temperature 97.6 F (36.4 C), resp. rate 18, weight 106.6 kg, SpO2 96 %.  Discharge Medication List Allergies as of 01/05/2020      Reactions   Vicodin [hydrocodone-acetaminophen] Itching      Medication List    TAKE these medications   Accu-Chek Guide Me w/Device Kit Use to check blood sugar TID. E11.21 Notes to patient: Continue home schedule    acetaminophen 325 MG tablet Commonly known as: TYLENOL Take 2 tablets (650 mg total) by mouth every 6 (six) hours as needed for mild pain (or Fever >/= 101).   alfuzosin 10 MG 24 hr tablet Commonly known as: UROXATRAL Take 10 mg by mouth at bedtime. Notes to patient: 01/05/2020   ARIPiprazole 5 MG tablet Commonly known as: ABILIFY TAKE 1 TABLET BY MOUTH DAILY FOR DEPRESSION What changed: See the new instructions. Notes to patient: 01/06/2020   atorvastatin 10 MG tablet Commonly known as: LIPITOR Take 1 tablet (10 mg total) by mouth at bedtime. Notes to patient: 01/05/2020   B-D UF III MINI PEN NEEDLES 31G X 5 MM Misc Generic drug: Insulin Pen Needle USE FOUR TIMES DAILY Notes to patient: Continue home schedule    buPROPion 300 MG 24 hr tablet Commonly known as: WELLBUTRIN XL Take 1 tablet (300 mg total) by mouth daily. Notes to patient: 01/06/2020   busPIRone 15 MG tablet Commonly known as: BUSPAR Take 15 mg by mouth 2 (two) times daily. Notes to patient: 01/05/2020   diclofenac sodium 1 % Gel Commonly known as: Voltaren Apply 4 g topically 4 (four) times daily. Notes to  patient: 01/05/2020   divalproex 500 MG DR tablet Commonly known as: DEPAKOTE Take 3 tablets (1,500 mg total) by mouth at bedtime. Notes to patient: 01/05/2020   feeding supplement (ENSURE ENLIVE) Liqd Take 237 mLs by mouth 2 (two) times daily between meals. Notes to patient: 01/05/2020   fluticasone 50 MCG/ACT nasal spray Commonly known as: FLONASE Place 1 spray into both nostrils daily. SHAKE LIQUID AND USE 2 SPRAYS IN EACH NOSTRIL DAILY Notes to patient: 01/06/2020   FreeStyle Libre 14 Day Reader Kerrin Mo Use as directed Notes to patient: Continue home schedule   FreeStyle Libre 14 Day Sensor Misc Use as directed Notes to patient: Continue home schedule   gabapentin 300 MG capsule Commonly known as: NEURONTIN Take 1 capsule (300 mg total) by mouth 2 (two) times daily. Notes to patient: 01/05/2020   glucose blood test strip Commonly known as: Accu-Chek Guide CHECK SUGAR FOUR TIMES DAILY Notes to patient: Continue home schedule    Levemir FlexTouch 100 UNIT/ML FlexPen Generic drug: insulin detemir Inject 45 Units into the skin 2 (two) times daily. Notes to patient: 01/05/2020   lidocaine 5 % Commonly known as: Lidoderm Place 1 patch onto the skin daily. Remove & Discard patch within 12 hours or as directed by MD   linagliptin 5 MG Tabs tablet Commonly known as: Tradjenta Take 1 tablet (5 mg total) by mouth daily. Notes to patient: 01/06/2020   lisinopril-hydrochlorothiazide 10-12.5 MG tablet Commonly  known as: ZESTORETIC Take 1 tablet by mouth daily. Notes to patient: 10/06/2019   methocarbamol 500 MG tablet Commonly known as: Robaxin Take 2 tablets (1,000 mg total) by mouth 2 (two) times daily. Notes to patient: 01/05/2020   Narcan 4 MG/0.1ML Liqd nasal spray kit Generic drug: naloxone CALL 911. ADMINISTER A SINGLE SPRAY OF NARCAN IN ONE NOSTRIL. REPEAT EVERY 3 MINUTES AS NEEDED IF NO OR MINIMAL RESPONSE.   NovoLOG FlexPen 100 UNIT/ML FlexPen Generic drug: insulin  aspart Inject 0-12 Units into the skin 2 (two) times daily. per sliding scale Notes to patient: 01/05/2020   omeprazole 40 MG capsule Commonly known as: PRILOSEC TAKE 1 CAPSULE(40 MG) BY MOUTH DAILY Notes to patient: 01/06/2020   tadalafil 5 MG tablet Commonly known as: CIALIS TAKE ONE TABLET BY MOUTH EVERY DAY FOR BPH Notes to patient: 01/06/2020   Testosterone 20.25 MG/1.25GM (1.62%) Gel Commonly known as: AndroGel Apply 20.25 mg topically every morning. Apply topically under arm pit alternating with each application. Notes to patient: 01/06/2020   traMADol 50 MG tablet Commonly known as: ULTRAM Take 1 tablet (50 mg total) by mouth at bedtime as needed (pain).   traZODone 150 MG tablet Commonly known as: DESYREL Take 1 tablet (150 mg total) by mouth at bedtime. Notes to patient: 01/05/2020            Discharge Care Instructions  (From admission, onward)         Start     Ordered   01/05/20 0000  Discharge wound care:       Comments: Wound / Incision (Open or Dehisced) 08/08/17 Other (Comment) Foot Left;Posterior Skin tear 879 days    Pressure Injury 01/02/20 Back Left;Upper Deep Tissue Pressure Injury - Purple or maroon localized area of discolored intact skin or blood-filled blister due to damage of underlying soft tissue from pressure and/or shear. Circular, dark purple area to bac 3 days   Pressure Injury 01/02/20 Sacrum Mid Stage 1 -  Intact skin with non-blanchable redness of a localized area usually over a bony prominence.   01/05/20 1253

## 2020-01-05 NOTE — Discharge Summary (Addendum)
[facility] Discharge Summary  Patient name: [name] Medical record number: [id number] Date of birth: [birth year] Age: 54 y.o. Gender: male Date of Admission: 01/01/2020  Date of Discharge: 01/05/2020 Admitting Physician: Rise Patience, DO  Primary Care Provider: [name], MD Consultants: Psych  Indication for Hospitalization: AMS, Rhabdomyolysis  Discharge Diagnoses/Problem List:  AMS (resolved) Rhabdomyolysis (resolved) Mild cognitive impairment  AKI (resolved) on CKD  Schizoaffective disorder, bipolar type T2DM Chronic thrombocytopenia (stable)  Disposition: Discharge to SNF  Discharge Condition: stable  Discharge Exam: Physical Exam Vitals and nursing note reviewed.  Constitutional:      General: He is not in acute distress.    Appearance: Normal appearance. He is normal weight. He is not ill-appearing or toxic-appearing.  HENT:     Head: Normocephalic.  Cardiovascular:     Rate and Rhythm: Normal rate and regular rhythm.     Pulses: Normal pulses.          Radial pulses are 2+ on the right side and 2+ on the left side.       Dorsalis pedis pulses are 2+ on the right side and 2+ on the left side.     Heart sounds: Normal heart sounds, S1 normal and S2 normal. No murmur heard.   Pulmonary:     Effort: Pulmonary effort is normal. No respiratory distress.     Breath sounds: Normal breath sounds. No wheezing.  Abdominal:     General: There is no distension.     Tenderness: There is no abdominal tenderness.  Musculoskeletal:     Right lower leg: No edema.     Left lower leg: No edema.  Neurological:     Mental Status: He is alert.    Brief Hospital Course:  AMS (resolved): Unclear cause of why he was down for three days. Patient is a poor historian (and has been noted to be at office visits in the past). His metabolic abnormalities did not reflect that he was down for three days. Unclear why he was unable to get up. Possibly  multifactorial with his possible early onset dementia and behavioral issues in the setting of medication noncompliance. Once IVF and medications were started this resolved with in a day and returned to baseline functional status. He had no further issues.  Rhabdomyolysis (resolved): Patient was found down in the bathroom after possibly 3 days surrounded by his own feces and urine. On admission CK was 2275 and treated with IVF. CK appropriately downtrended, last check was  956. Subsequently, IVF stopped and patient had no problems tolerating fluids orally. No other symptoms appeared.  Mild cognitive impairment: Patient was previously diagnosed with early-onset dementia in Nov 2019 with disorientation, confusion, and frequent falls. This diagnosis was questioned and these spells of confusion and memory loss thought to be related to psychiatry issues (underlying schizoaffective diagnosis) and metabolic derangements. After AMS resolved with restarting psychiatric medications, patient returned to baseline cognitive status; no further issues where noted during admission.  AKI (resolved) on CKD: Patient is followed by nephrology at [facility]. Patient admitted with Cr of 2.06, with a baseline of 1.7. Patient treated with IVF and creatinine appropriately downtrended, last check on 9/15 was 1.48.   Schizoaffective disorder, bipolar type: Was restarted on home medications. Psychiatry was consulted for medication management. Depakote levels were initially supratherapeutic, so it was briefly held and subsequently restarted, with appropriate follow up lab values. Recommend follow up outpatient to ensure steady state maintained after discharge.  T2DM: Last HbA1C 9.0% on 11/01/19. Started Levemir 12 units BID and Sliding Scale. A1C was redrawn and was improved to 7.9%. CBG showed glucose was well controlled on this regiment. Monitored CBG before meals and QHS.  Chronic thrombocytopenia (stable): On  chart review patient noted to have chronic thrombocytopenia in the 50's. On admission platelets where found to be 124. Continued to monitor without significant changes during admission. Recommend outpatient follow up, perhaps requires referral to hematology if no improvement.  Issues for Follow Up:  Schizoaffective disorder, bipolar type: Follow up outpatient appointment with Psychiatry. Patient found down for 3 days with initial though blocking and altered mentation that rapidly improved with restarting home medications. Continue monitoring medications for effectiveness.  Mild cognitive impairment : Continue to monitor, further consults to Neuro if further memory decline or impairment.  AKI (resolved) on CKD: Check BMP to monitor cr.  T2DM: A1C has improved to 7.9. Ensure continued medication compliance and CBG monitoring.  Chronic thrombocytopenia (stable): Continue to trend CBC for any changes; patient may need outpatient work up or hematology referral for persistently low platelets.  Significant Procedures: None  Significant Labs and Imaging:  Recent Labs  Lab 01/01/20 1520  WBC 8.0  HGB 13.9  HCT 41.2  PLT 124*   Recent Labs  Lab 01/01/20 1520 01/01/20 1520 01/01/20 2151 01/02/20 0201 01/02/20 0201 01/02/20 2219 01/02/20 2219 01/03/20 0651 01/04/20 0844  NA 140  --   --  143  --  139  --  138 134*  K 5.5*   < >  --  4.3   < > 4.2   < > 4.7 3.9  CL 103  --   --  106  --  104  --  105 100  CO2 23  --   --  23  --  24  --  25 26  GLUCOSE 303*  --   --  266*  --  162*  --  147* 210*  BUN 36*  --   --  41*  --  35*  --  31* 27*  CREATININE 2.06*  --   --  1.94*  --  1.64*  --  1.43* 1.48*  CALCIUM 9.8  --   --  9.4  --  8.9  --  9.1 9.1  MG  --   --   --  2.0  --   --   --  2.0  --   PHOS  --   --   --  3.3  --  3.7  --  3.0  --   ALKPHOS  --   --  95 81  --   --   --   --   --   AST  --   --  49* 47*  --   --   --   --   --   ALT  --   --  20 16  --   --   --   --   --    ALBUMIN  --   --  3.7 3.5  --  3.0*  --  3.0*  --    < > = values in this interval not displayed.    CK: 2275 > 2205 > 956  CT Cervical Spine Wo Contrast: No acute fracture or subluxation. Stable moderate to marked severity multilevel degenerative changes.     CT Head Wo Contrast: Generalized cerebral atrophy. No acute intracranial abnormality.     DG  Pelvis Portable: Intact bilateral total hip replacements without postoperative changes seen within the lower lumbar spine. No acute osseous abnormality.     DG Chest Portable 1 View: No active cardiopulmonary disease.   Results/Tests Pending at Time of Discharge: None  Discharge Medications:  Allergies as of 01/05/2020       Reactions   Vicodin [hydrocodone-acetaminophen] Itching        Medication List     TAKE these medications    Accu-Chek Guide Me w/Device Kit Use to check blood sugar TID. E11.21   acetaminophen 325 MG tablet Commonly known as: TYLENOL Take 2 tablets (650 mg total) by mouth every 6 (six) hours as needed for mild pain (or Fever >/= 101).   alfuzosin 10 MG 24 hr tablet Commonly known as: UROXATRAL Take 10 mg by mouth at bedtime.   ARIPiprazole 5 MG tablet Commonly known as: ABILIFY TAKE 1 TABLET BY MOUTH DAILY FOR DEPRESSION What changed: See the new instructions.   atorvastatin 10 MG tablet Commonly known as: LIPITOR Take 1 tablet (10 mg total) by mouth at bedtime.   B-D UF III MINI PEN NEEDLES 31G X 5 MM Misc Generic drug: Insulin Pen Needle USE FOUR TIMES DAILY   buPROPion 300 MG 24 hr tablet Commonly known as: WELLBUTRIN XL Take 1 tablet (300 mg total) by mouth daily.   busPIRone 15 MG tablet Commonly known as: BUSPAR Take 15 mg by mouth 2 (two) times daily.   diclofenac sodium 1 % Gel Commonly known as: Voltaren Apply 4 g topically 4 (four) times daily.   divalproex 500 MG DR tablet Commonly known as: DEPAKOTE Take 3 tablets (1,500 mg total) by mouth at bedtime.   feeding  supplement (ENSURE ENLIVE) Liqd Take 237 mLs by mouth 2 (two) times daily between meals.   fluticasone 50 MCG/ACT nasal spray Commonly known as: FLONASE Place 1 spray into both nostrils daily. SHAKE LIQUID AND USE 2 SPRAYS IN EACH NOSTRIL DAILY   FreeStyle Libre 14 Day Reader Kerrin Mo Use as directed   [employer] 14 Day Sensor Misc Use as directed   gabapentin 300 MG capsule Commonly known as: NEURONTIN Take 1 capsule (300 mg total) by mouth 2 (two) times daily.   glucose blood test strip Commonly known as: Accu-Chek Guide CHECK SUGAR FOUR TIMES DAILY   Levemir FlexTouch 100 UNIT/ML FlexPen Generic drug: insulin detemir Inject 45 Units into the skin 2 (two) times daily.   lidocaine 5 % Commonly known as: Lidoderm Place 1 patch onto the skin daily. Remove & Discard patch within 12 hours or as directed by MD   linagliptin 5 MG Tabs tablet Commonly known as: Tradjenta Take 1 tablet (5 mg total) by mouth daily.   lisinopril-hydrochlorothiazide 10-12.5 MG tablet Commonly known as: ZESTORETIC Take 1 tablet by mouth daily.   methocarbamol 500 MG tablet Commonly known as: Robaxin Take 2 tablets (1,000 mg total) by mouth 2 (two) times daily.   Narcan 4 MG/0.1ML Liqd nasal spray kit Generic drug: naloxone CALL 911. ADMINISTER A SINGLE SPRAY OF NARCAN IN ONE NOSTRIL. REPEAT EVERY 3 MINUTES AS NEEDED IF NO OR MINIMAL RESPONSE.   NovoLOG FlexPen 100 UNIT/ML FlexPen Generic drug: insulin aspart Inject 0-12 Units into the skin 2 (two) times daily. per sliding scale   omeprazole 40 MG capsule Commonly known as: PRILOSEC TAKE 1 CAPSULE(40 MG) BY MOUTH DAILY   tadalafil 5 MG tablet Commonly known as: CIALIS TAKE ONE TABLET BY MOUTH EVERY DAY FOR BPH  Testosterone 20.25 MG/1.25GM (1.62%) Gel Commonly known as: AndroGel Apply 20.25 mg topically every morning. Apply topically under arm pit alternating with each application.   traMADol 50 MG tablet Commonly known as:  ULTRAM Take 1 tablet (50 mg total) by mouth at bedtime as needed (pain).   traZODone 150 MG tablet Commonly known as: DESYREL Take 1 tablet (150 mg total) by mouth at bedtime.               Discharge Care Instructions  (From admission, onward)           Start     Ordered   01/05/20 0000  Discharge wound care:       Comments: Wound / Incision (Open or Dehisced) 08/08/17 Other (Comment) Foot Left;Posterior Skin tear 879 days     Pressure Injury 01/02/20 Back Left;Upper Deep Tissue Pressure Injury - Purple or maroon localized area of discolored intact skin or blood-filled blister due to damage of underlying soft tissue from pressure and/or shear. Circular, dark purple area to bac 3 days    Pressure Injury 01/02/20 Sacrum Mid Stage 1 -  Intact skin with non-blanchable redness of a localized area usually over a bony prominence.   01/05/20 1253            Discharge Instructions: Please refer to Patient Instructions section of EMR for full details.  Patient was counseled important signs and symptoms that should prompt return to medical care, changes in medications, dietary instructions, activity restrictions, and follow up appointments.   Follow-Up Appointments:  Contact information for follow-up providers     [name], MD. Schedule an appointment as soon as possible for a visit.   Specialty: Family Medicine Why: Make an appointment in the next 1-2 weeks Contact information: [place] [address] [phone]              Contact information for after-discharge care     [facility] .   Service: Skilled Nursing Contact information: [address] [place], Alexander S, MD 01/05/2020, 1:24 PM PGY-1, [place]

## 2020-01-05 NOTE — TOC Progression Note (Addendum)
Transition of Care [facility]) - Progression Note    Patient Details  Name: WINNER VALERIANO MRN: [id number] Date of Birth: 05/12/1965  Transition of Care [facility]) CM/SW [place], LCSW Phone Number: 01/05/2020, 9:11 AM  Clinical Narrative:    9:11am-Per Baxter Flattery, CSW, patient's sister has selected [facility]. CSW contacted [employer] to begin insurance but they reported they do not manage the patient. CSW sent a message to [facility] to request they contact [employer] to begin insurance auth and to see if they can accept the patient on the waiver currently in place. PT on the schedule to see patient today to provide updated notes.   [facility] confirmed they will be able to accept patient today with the waiver. CSW updated patient's sister, [name] on the discharge plan. She reported concerns about the patient's nurse yesterday due to her overhearing the nurse tell the patient that he should be able to go to the bathroom on his own. She requested that CSW pass on to the SNF that patient has cognitive deficits and needs to be checked on.  12:44pm-CSW spoke with patient. He is aware that he will be discharging to SNF today.   Expected Discharge Plan: Skilled Nursing Facility Barriers to Discharge: [place] [facility]), SNF Pending bed offer, Continued Medical Work up  Expected Discharge Plan and Services Expected Discharge Plan: [place] In-house Referral: Clinical Social Work   Post Acute Care Choice: [place] arrangements for the past 2 months: Single Family Home                                       Social Determinants of Health (SDOH) Interventions    Readmission Risk Interventions Readmission Risk Prevention Plan 01/02/2020  Transportation Screening Complete  PCP or Specialist Appt within 3-5 Days Complete  HRI or [place] Complete  Social Work Consult for [place] Planning/Counseling  Complete  Palliative Care Screening Not Applicable  Medication Review Press photographer) Referral to Pharmacy  Some recent data might be hidden

## 2020-01-06 ENCOUNTER — Telehealth: Payer: Self-pay | Admitting: Family Medicine

## 2020-01-06 NOTE — Telephone Encounter (Signed)
Copied from [place] [phone]. Topic: General - Other >> Jan 06, 2020  3:29 PM [name] wrote: Reason for CRM: [facility] Faxed over a Plan of care for this Pt on 8.4.21 and still has not received it/ [name] called to check on the status of this / please advise

## 2020-01-09 ENCOUNTER — Other Ambulatory Visit: Payer: Self-pay

## 2020-01-09 NOTE — Telephone Encounter (Signed)
Orders has been re faxed over.

## 2020-01-10 NOTE — Patient Outreach (Signed)
[place] [facility]) Care Management  01/10/2020  [name]. 1965/12/06 [id number]   Referral Date: 01/09/20 Referral Source: [employer] Report Date of Discharge: 01/05/20 Facility:  [place]: [facility]   Referral received.  No outreach warranted at this time.  Transition of Care calls being completed via EMMI. RN CM will outreach patient for any red flags received.    Plan: RN CM will close case.    [name], RN, MSN [facility] Care Management Care Management Coordinator Direct Line [phone] Toll Free: [phone]  Fax: [phone]

## 2020-01-17 ENCOUNTER — Ambulatory Visit: Payer: Medicare HMO | Admitting: Gastroenterology

## 2020-01-20 ENCOUNTER — Other Ambulatory Visit: Payer: Self-pay | Admitting: Physician Assistant

## 2020-01-20 DIAGNOSIS — N189 Chronic kidney disease, unspecified: Secondary | ICD-10-CM | POA: Diagnosis not present

## 2020-01-20 DIAGNOSIS — F419 Anxiety disorder, unspecified: Secondary | ICD-10-CM | POA: Diagnosis not present

## 2020-01-20 DIAGNOSIS — N179 Acute kidney failure, unspecified: Secondary | ICD-10-CM | POA: Diagnosis not present

## 2020-01-20 DIAGNOSIS — F259 Schizoaffective disorder, unspecified: Secondary | ICD-10-CM | POA: Diagnosis not present

## 2020-01-20 DIAGNOSIS — F32A Depression, unspecified: Secondary | ICD-10-CM | POA: Diagnosis not present

## 2020-01-20 DIAGNOSIS — D696 Thrombocytopenia, unspecified: Secondary | ICD-10-CM | POA: Diagnosis not present

## 2020-01-20 DIAGNOSIS — E1140 Type 2 diabetes mellitus with diabetic neuropathy, unspecified: Secondary | ICD-10-CM | POA: Diagnosis not present

## 2020-01-20 DIAGNOSIS — G4700 Insomnia, unspecified: Secondary | ICD-10-CM | POA: Diagnosis not present

## 2020-01-20 DIAGNOSIS — I10 Essential (primary) hypertension: Secondary | ICD-10-CM

## 2020-01-23 DIAGNOSIS — K219 Gastro-esophageal reflux disease without esophagitis: Secondary | ICD-10-CM | POA: Diagnosis not present

## 2020-01-23 DIAGNOSIS — M6282 Rhabdomyolysis: Secondary | ICD-10-CM | POA: Diagnosis not present

## 2020-01-23 DIAGNOSIS — R4182 Altered mental status, unspecified: Secondary | ICD-10-CM | POA: Diagnosis not present

## 2020-01-23 DIAGNOSIS — F259 Schizoaffective disorder, unspecified: Secondary | ICD-10-CM | POA: Diagnosis not present

## 2020-01-23 DIAGNOSIS — N179 Acute kidney failure, unspecified: Secondary | ICD-10-CM | POA: Diagnosis not present

## 2020-01-23 DIAGNOSIS — E119 Type 2 diabetes mellitus without complications: Secondary | ICD-10-CM | POA: Diagnosis not present

## 2020-01-23 DIAGNOSIS — M47816 Spondylosis without myelopathy or radiculopathy, lumbar region: Secondary | ICD-10-CM | POA: Diagnosis not present

## 2020-01-23 DIAGNOSIS — D696 Thrombocytopenia, unspecified: Secondary | ICD-10-CM | POA: Diagnosis not present

## 2020-01-23 DIAGNOSIS — F319 Bipolar disorder, unspecified: Secondary | ICD-10-CM | POA: Diagnosis not present

## 2020-01-24 DIAGNOSIS — G8929 Other chronic pain: Secondary | ICD-10-CM | POA: Diagnosis not present

## 2020-01-24 DIAGNOSIS — M6281 Muscle weakness (generalized): Secondary | ICD-10-CM | POA: Diagnosis not present

## 2020-01-24 DIAGNOSIS — F0390 Unspecified dementia without behavioral disturbance: Secondary | ICD-10-CM | POA: Diagnosis not present

## 2020-01-24 DIAGNOSIS — G9349 Other encephalopathy: Secondary | ICD-10-CM | POA: Diagnosis not present

## 2020-01-25 ENCOUNTER — Other Ambulatory Visit: Payer: Self-pay

## 2020-01-25 DIAGNOSIS — F250 Schizoaffective disorder, bipolar type: Secondary | ICD-10-CM | POA: Diagnosis not present

## 2020-01-25 NOTE — Patient Outreach (Signed)
[place] [facility]) Care Management  01/25/2020  Joeph Szatkowski 1965-06-13 [id number]   Referral Date: 01/25/20 Referral Source: [employer] Report Date of Discharge: 01/23/20 Facility:  [place]: [facility]   Referral received.  No outreach warranted at this time.  Transition of Care calls being completed via EMMI. RN CM will outreach patient for any red flags received.    Plan: RN CM will close case.    [name], RN, MSN [facility] Care Management Care Management Coordinator Direct Line [phone] Toll Free: [phone]  Fax: [phone]

## 2020-01-26 ENCOUNTER — Other Ambulatory Visit: Payer: Self-pay

## 2020-01-26 NOTE — Patient Outreach (Signed)
[facility] [place] [facility]) Care Management  01/26/2020  Butch Otterson. 1965/07/25 [id number]   EMMI- General Discharge RED ON EMMI ALERT Day # 1 Date: 01/25/20 Red Alert Reason:  Read discharge papers? No  Scheduled follow-up? No    Outreach attempt: Spoke with patient.  He states he has some neck pain from sleeping wrong and possibly stress.  Discussed stress management.  He verbalized understanding.  Addressed red alerts.  Patient reports that he has not read discharge instructions.  Encouraged patient to read them so that he knows exactly what to do and following up with PCP for questions.  He verbalized understanding.  His sister is his help as needed.  Patient already has follow up appointments scheduled with GI and PCP.  He states that his sister normally takes him. Discussed [employer] transportation as a back up. He verbalized understanding.  Patient reports that home health is involved and people are scheduled to visit.  Patient declines further nurse follow up at this time.     Advised patient that they would continue to get automated EMMI-GENERAL post discharge calls to assess how they are doing following recent hospitalization and will receive a call from a nurse if any of their responses were abnormal. Patient voiced understanding and was appreciative of follow up call.     Plan: RN CM will close case.    [name], RN, MSN [facility] Care Management Care Management Coordinator Direct Line [phone] Toll Free: [phone]  Fax: [phone]

## 2020-01-30 DIAGNOSIS — E1165 Type 2 diabetes mellitus with hyperglycemia: Secondary | ICD-10-CM | POA: Diagnosis not present

## 2020-01-31 ENCOUNTER — Other Ambulatory Visit: Payer: Self-pay

## 2020-01-31 NOTE — Patient Outreach (Signed)
[place] [facility]) Care Management  01/31/2020  [name] [name]. [birth year] [id number]   Telephone call to sister [name].  She states that she needs assistance in getting PCS back for patient.  She reports that patient has some dementia and is a fall risk.  She states he needs some assistance with his care.  She is not sure of the agency that handled him before but they did not start back when he left rehab.  Screening completed.  Discussed [facility] services. She is agreeable to social work referral at this time for assistance in starting PCS back.  Patient is an active part of the [employer] D-[employer] at this time and receives regular nurse outreach.   Plan: RN CM will refer to social work. RN CM will close CM portion of case at this time.    [name], RN, MSN [place] Management Care Management Coordinator Direct Line [phone] Cell [phone] Toll Free: [phone]  Fax: [phone]

## 2020-02-01 ENCOUNTER — Ambulatory Visit (INDEPENDENT_AMBULATORY_CARE_PROVIDER_SITE_OTHER): Payer: Medicare HMO | Admitting: Gastroenterology

## 2020-02-01 ENCOUNTER — Encounter: Payer: Self-pay | Admitting: Gastroenterology

## 2020-02-01 VITALS — BP 140/90 | HR 82 | Ht 77.0 in | Wt 203.0 lb

## 2020-02-01 DIAGNOSIS — K921 Melena: Secondary | ICD-10-CM

## 2020-02-01 DIAGNOSIS — F250 Schizoaffective disorder, bipolar type: Secondary | ICD-10-CM | POA: Diagnosis not present

## 2020-02-01 DIAGNOSIS — D509 Iron deficiency anemia, unspecified: Secondary | ICD-10-CM

## 2020-02-01 MED ORDER — FERROUS SULFATE 325 (65 FE) MG PO TABS: 325.0000 mg | ORAL_TABLET | Freq: Every day | ORAL | 11 refills | Status: DC

## 2020-02-01 MED ORDER — NA SULFATE-K SULFATE-MG SULF 17.5-3.13-1.6 GM/177ML PO SOLN: 1.0000 | Freq: Once | ORAL | 0 refills | Status: AC

## 2020-02-01 NOTE — Progress Notes (Addendum)
° ° °  History of Present Illness: This is a 54 year old male with rectal bleeding and iron deficiency anemia.  He is accompanied by his sister who is his guardian and POA.  Evaluation by Tye Savoy, NP on August 5 - please refer to that note for full history.  Subsequent blood work revealed iron deficiency.  Colonoscopy and EGD are recommended.  Due to his comorbidities, including neuropathy, degenerative spine disease, dementia with mobility limitations his bowel prep may be more difficult. They are here to discuss options.  No new gastrointestinal complaints.  Current Medications, Allergies, Past Medical History, Past Surgical History, Family History and Social History were reviewed in [employer] record.   Physical Exam: General: Well developed, well nourished, no acute distress, uses a walker Head: Normocephalic and atraumatic Eyes:  sclerae anicteric, EOMI Ears: Normal auditory acuity Mouth: Not examined, mask on during Covid-19 pandemic Lungs: Clear throughout to auscultation Heart: Regular rate and rhythm; no murmurs, rubs or bruits Abdomen: Soft, non tender and non distended. No masses, hepatosplenomegaly or hernias noted. Normal Bowel sounds Rectal: Deferred to colonoscopy Musculoskeletal: Symmetrical with no gross deformities  Pulses:  Normal pulses noted Extremities: No clubbing, cyanosis, edema or deformities noted Neurological: Alert oriented x 4, grossly nonfocal Psychological:  Alert and cooperative. Normal mood and affect   Assessment and Recommendations:  1. IDA, hematochezia, constipation.  Rule out colorectal neoplasms, hemorrhoids, AVMs, etc. Miralax qd. Begin FeSO4 325 mg qd. Call if constipation persists. He has adequate mobility for procedures in the [place].  With assistance from his sister during the entire bowel prep he should be able to adequately and safely prep at home.  Schedule colonoscopy and EGD. The risks (including bleeding,  perforation, infection, missed lesions, medication reactions and possible hospitalization or surgery if complications occur), benefits, and alternatives to colonoscopy with possible biopsy and possible polypectomy were discussed with the patient and they consent to proceed. The risks (including bleeding, perforation, infection, missed lesions, medication reactions and possible hospitalization or surgery if complications occur), benefits, and alternatives to endoscopy with possible biopsy and possible dilation were discussed with the patient and they consent to proceed.

## 2020-02-01 NOTE — Patient Instructions (Signed)
Start over the counter Miralax mixing 17 grams in 8 oz of water/juice daily.   We have sent the following medications to your pharmacy for you to pick up at your convenience: ferrous sulfate 325 mg daily.   You have been scheduled for an endoscopy and colonoscopy. Please follow the written instructions given to you at your visit today. Please pick up your prep supplies at the pharmacy within the next 1-3 days. If you use inhalers (even only as needed), please bring them with you on the day of your procedure.   Thank you for choosing me and [facility].  Pricilla Riffle. Dagoberto Ligas., MD., Marval Regal

## 2020-02-02 DIAGNOSIS — M19019 Primary osteoarthritis, unspecified shoulder: Secondary | ICD-10-CM | POA: Diagnosis not present

## 2020-02-02 DIAGNOSIS — E1122 Type 2 diabetes mellitus with diabetic chronic kidney disease: Secondary | ICD-10-CM | POA: Diagnosis not present

## 2020-02-02 DIAGNOSIS — E1142 Type 2 diabetes mellitus with diabetic polyneuropathy: Secondary | ICD-10-CM | POA: Diagnosis not present

## 2020-02-02 DIAGNOSIS — I129 Hypertensive chronic kidney disease with stage 1 through stage 4 chronic kidney disease, or unspecified chronic kidney disease: Secondary | ICD-10-CM | POA: Diagnosis not present

## 2020-02-02 DIAGNOSIS — F0390 Unspecified dementia without behavioral disturbance: Secondary | ICD-10-CM | POA: Diagnosis not present

## 2020-02-02 DIAGNOSIS — N183 Chronic kidney disease, stage 3 unspecified: Secondary | ICD-10-CM | POA: Diagnosis not present

## 2020-02-02 DIAGNOSIS — M5116 Intervertebral disc disorders with radiculopathy, lumbar region: Secondary | ICD-10-CM | POA: Diagnosis not present

## 2020-02-02 DIAGNOSIS — M792 Neuralgia and neuritis, unspecified: Secondary | ICD-10-CM | POA: Diagnosis not present

## 2020-02-02 DIAGNOSIS — E785 Hyperlipidemia, unspecified: Secondary | ICD-10-CM | POA: Diagnosis not present

## 2020-02-03 ENCOUNTER — Telehealth: Payer: Self-pay | Admitting: Family Medicine

## 2020-02-03 ENCOUNTER — Other Ambulatory Visit: Payer: Self-pay | Admitting: Physician Assistant

## 2020-02-03 DIAGNOSIS — F411 Generalized anxiety disorder: Secondary | ICD-10-CM

## 2020-02-03 NOTE — Telephone Encounter (Signed)
Copied from [place] [id number]. Topic: Quick Communication - See Telephone Encounter >> Feb 02, 2020  2:55 PM [place] wrote: CRM for notification. See Telephone encounter for: 02/02/20.Request PT 2 times a week for 2 week, 1 time a wk for 5 wks. Request  for Social Worker is being approved. Call back to [facility] [address]

## 2020-02-03 NOTE — Telephone Encounter (Signed)
[facility] was called and given verbal orders for patient.

## 2020-02-06 ENCOUNTER — Telehealth: Payer: Self-pay | Admitting: Family Medicine

## 2020-02-06 ENCOUNTER — Other Ambulatory Visit: Payer: Self-pay | Admitting: Family Medicine

## 2020-02-06 ENCOUNTER — Other Ambulatory Visit: Payer: Self-pay

## 2020-02-06 DIAGNOSIS — Z794 Long term (current) use of insulin: Secondary | ICD-10-CM

## 2020-02-06 DIAGNOSIS — N1831 Chronic kidney disease, stage 3a: Secondary | ICD-10-CM

## 2020-02-06 NOTE — Telephone Encounter (Signed)
[name] at [place] at home calling for verbal skilled nursing orders for medication management She doesn't expect it to be very long but pt is not compliant with his meds  Ok to leave VM cb [phone]

## 2020-02-06 NOTE — Patient Outreach (Signed)
[place] [facility]) Care Management  02/06/2020  [name]. 1965-06-14 [id number]   Return call to Louis Matte after inquiring about social worker contact.  No answer.  HIPAA compliant voice message left.   [name], RN, MSN [place] Management Care Management Coordinator Direct Line [phone] Cell [phone] Toll Free: [phone]  Fax: [phone]

## 2020-02-06 NOTE — Telephone Encounter (Signed)
[name] was called and a voicemail was left giving her verbal orders for patient.

## 2020-02-07 ENCOUNTER — Telehealth: Payer: Self-pay | Admitting: Family Medicine

## 2020-02-07 ENCOUNTER — Other Ambulatory Visit: Payer: Self-pay | Admitting: *Deleted

## 2020-02-07 DIAGNOSIS — N183 Chronic kidney disease, stage 3 unspecified: Secondary | ICD-10-CM | POA: Diagnosis not present

## 2020-02-07 DIAGNOSIS — E1142 Type 2 diabetes mellitus with diabetic polyneuropathy: Secondary | ICD-10-CM | POA: Diagnosis not present

## 2020-02-07 DIAGNOSIS — M5116 Intervertebral disc disorders with radiculopathy, lumbar region: Secondary | ICD-10-CM | POA: Diagnosis not present

## 2020-02-07 DIAGNOSIS — E1122 Type 2 diabetes mellitus with diabetic chronic kidney disease: Secondary | ICD-10-CM | POA: Diagnosis not present

## 2020-02-07 DIAGNOSIS — E785 Hyperlipidemia, unspecified: Secondary | ICD-10-CM | POA: Diagnosis not present

## 2020-02-07 DIAGNOSIS — I129 Hypertensive chronic kidney disease with stage 1 through stage 4 chronic kidney disease, or unspecified chronic kidney disease: Secondary | ICD-10-CM | POA: Diagnosis not present

## 2020-02-07 DIAGNOSIS — M792 Neuralgia and neuritis, unspecified: Secondary | ICD-10-CM | POA: Diagnosis not present

## 2020-02-07 DIAGNOSIS — M19019 Primary osteoarthritis, unspecified shoulder: Secondary | ICD-10-CM | POA: Diagnosis not present

## 2020-02-07 DIAGNOSIS — F0390 Unspecified dementia without behavioral disturbance: Secondary | ICD-10-CM | POA: Diagnosis not present

## 2020-02-07 NOTE — Telephone Encounter (Signed)
Called back, LVM.

## 2020-02-07 NOTE — Telephone Encounter (Signed)
Arden ST with [facility] is calling and would like verbal orders for speech therapy 1x4

## 2020-02-07 NOTE — Patient Outreach (Signed)
[place] [facility]) Care Management  02/07/2020  [name] [name]. Nov 16, 1965 [id number]   CSW received consult on 01/31/2020 and attempted  to reach patient/family for initial phone assessment and appointment scheduling today. CSW was unable to reach pt/family. A HIPPA compliant voice message was left. CSW will await callback or try again in 3-4 business days per policy. CSW will mail pt an Unsuccessful outreach letter as well.   Eduard Clos, MSW, [place] Worker  [place] [phone]

## 2020-02-08 DIAGNOSIS — E1142 Type 2 diabetes mellitus with diabetic polyneuropathy: Secondary | ICD-10-CM | POA: Diagnosis not present

## 2020-02-08 DIAGNOSIS — M5116 Intervertebral disc disorders with radiculopathy, lumbar region: Secondary | ICD-10-CM | POA: Diagnosis not present

## 2020-02-08 DIAGNOSIS — E1122 Type 2 diabetes mellitus with diabetic chronic kidney disease: Secondary | ICD-10-CM | POA: Diagnosis not present

## 2020-02-08 DIAGNOSIS — E785 Hyperlipidemia, unspecified: Secondary | ICD-10-CM | POA: Diagnosis not present

## 2020-02-08 DIAGNOSIS — F0390 Unspecified dementia without behavioral disturbance: Secondary | ICD-10-CM | POA: Diagnosis not present

## 2020-02-08 DIAGNOSIS — M792 Neuralgia and neuritis, unspecified: Secondary | ICD-10-CM | POA: Diagnosis not present

## 2020-02-08 DIAGNOSIS — N183 Chronic kidney disease, stage 3 unspecified: Secondary | ICD-10-CM | POA: Diagnosis not present

## 2020-02-08 DIAGNOSIS — M19019 Primary osteoarthritis, unspecified shoulder: Secondary | ICD-10-CM | POA: Diagnosis not present

## 2020-02-08 DIAGNOSIS — I129 Hypertensive chronic kidney disease with stage 1 through stage 4 chronic kidney disease, or unspecified chronic kidney disease: Secondary | ICD-10-CM | POA: Diagnosis not present

## 2020-02-09 DIAGNOSIS — M19019 Primary osteoarthritis, unspecified shoulder: Secondary | ICD-10-CM | POA: Diagnosis not present

## 2020-02-09 DIAGNOSIS — E1142 Type 2 diabetes mellitus with diabetic polyneuropathy: Secondary | ICD-10-CM | POA: Diagnosis not present

## 2020-02-09 DIAGNOSIS — E1122 Type 2 diabetes mellitus with diabetic chronic kidney disease: Secondary | ICD-10-CM | POA: Diagnosis not present

## 2020-02-09 DIAGNOSIS — E785 Hyperlipidemia, unspecified: Secondary | ICD-10-CM | POA: Diagnosis not present

## 2020-02-09 DIAGNOSIS — M792 Neuralgia and neuritis, unspecified: Secondary | ICD-10-CM | POA: Diagnosis not present

## 2020-02-09 DIAGNOSIS — N183 Chronic kidney disease, stage 3 unspecified: Secondary | ICD-10-CM | POA: Diagnosis not present

## 2020-02-09 DIAGNOSIS — I129 Hypertensive chronic kidney disease with stage 1 through stage 4 chronic kidney disease, or unspecified chronic kidney disease: Secondary | ICD-10-CM | POA: Diagnosis not present

## 2020-02-09 DIAGNOSIS — M5116 Intervertebral disc disorders with radiculopathy, lumbar region: Secondary | ICD-10-CM | POA: Diagnosis not present

## 2020-02-09 DIAGNOSIS — F0390 Unspecified dementia without behavioral disturbance: Secondary | ICD-10-CM | POA: Diagnosis not present

## 2020-02-10 ENCOUNTER — Other Ambulatory Visit: Payer: Self-pay | Admitting: *Deleted

## 2020-02-10 ENCOUNTER — Telehealth: Payer: Self-pay | Admitting: Family Medicine

## 2020-02-10 ENCOUNTER — Ambulatory Visit: Payer: Self-pay | Admitting: *Deleted

## 2020-02-10 DIAGNOSIS — I129 Hypertensive chronic kidney disease with stage 1 through stage 4 chronic kidney disease, or unspecified chronic kidney disease: Secondary | ICD-10-CM | POA: Diagnosis not present

## 2020-02-10 DIAGNOSIS — M792 Neuralgia and neuritis, unspecified: Secondary | ICD-10-CM | POA: Diagnosis not present

## 2020-02-10 DIAGNOSIS — N183 Chronic kidney disease, stage 3 unspecified: Secondary | ICD-10-CM | POA: Diagnosis not present

## 2020-02-10 DIAGNOSIS — F0390 Unspecified dementia without behavioral disturbance: Secondary | ICD-10-CM | POA: Diagnosis not present

## 2020-02-10 DIAGNOSIS — E1142 Type 2 diabetes mellitus with diabetic polyneuropathy: Secondary | ICD-10-CM | POA: Diagnosis not present

## 2020-02-10 DIAGNOSIS — M5116 Intervertebral disc disorders with radiculopathy, lumbar region: Secondary | ICD-10-CM | POA: Diagnosis not present

## 2020-02-10 DIAGNOSIS — E1122 Type 2 diabetes mellitus with diabetic chronic kidney disease: Secondary | ICD-10-CM | POA: Diagnosis not present

## 2020-02-10 DIAGNOSIS — E785 Hyperlipidemia, unspecified: Secondary | ICD-10-CM | POA: Diagnosis not present

## 2020-02-10 DIAGNOSIS — M19019 Primary osteoarthritis, unspecified shoulder: Secondary | ICD-10-CM | POA: Diagnosis not present

## 2020-02-10 NOTE — Patient Outreach (Signed)
[place] [facility]) Care Management  02/10/2020  Vontae Court Ayyub Krall. January 27, 1966 [id number]   CSW attempted to reach pt/family and was unsuccessful.  CSW left a HIPPA compliant voice message and will await callback or try again in 3-4 business days if no return call is received.   Eduard Clos, MSW, [place] Worker  [place] [phone]

## 2020-02-10 NOTE — Telephone Encounter (Signed)
Copied from [place] [phone]. Topic: Quick Communication - Home Health Verbal Orders >> Feb 10, 2020  1:34 PM Leward Quan A wrote: Caller/Agency: Santiago Glad // [facility] Number: [phone] [place] [facility]  Requesting OT/PT/Skilled Nursing/Social Work/Speech Therapy: Nursing instruction and medication management  Frequency: 1 w1, 2 w 2, 1 w 3 and 1 PRN

## 2020-02-10 NOTE — Telephone Encounter (Signed)
[name] was given verbal orders for Nursing only.

## 2020-02-13 DIAGNOSIS — E1142 Type 2 diabetes mellitus with diabetic polyneuropathy: Secondary | ICD-10-CM | POA: Diagnosis not present

## 2020-02-13 DIAGNOSIS — E1122 Type 2 diabetes mellitus with diabetic chronic kidney disease: Secondary | ICD-10-CM | POA: Diagnosis not present

## 2020-02-13 DIAGNOSIS — M5116 Intervertebral disc disorders with radiculopathy, lumbar region: Secondary | ICD-10-CM | POA: Diagnosis not present

## 2020-02-13 DIAGNOSIS — I129 Hypertensive chronic kidney disease with stage 1 through stage 4 chronic kidney disease, or unspecified chronic kidney disease: Secondary | ICD-10-CM | POA: Diagnosis not present

## 2020-02-13 DIAGNOSIS — M792 Neuralgia and neuritis, unspecified: Secondary | ICD-10-CM | POA: Diagnosis not present

## 2020-02-13 DIAGNOSIS — N183 Chronic kidney disease, stage 3 unspecified: Secondary | ICD-10-CM | POA: Diagnosis not present

## 2020-02-13 DIAGNOSIS — M19019 Primary osteoarthritis, unspecified shoulder: Secondary | ICD-10-CM | POA: Diagnosis not present

## 2020-02-13 DIAGNOSIS — E785 Hyperlipidemia, unspecified: Secondary | ICD-10-CM | POA: Diagnosis not present

## 2020-02-13 DIAGNOSIS — F0390 Unspecified dementia without behavioral disturbance: Secondary | ICD-10-CM | POA: Diagnosis not present

## 2020-02-14 DIAGNOSIS — F0390 Unspecified dementia without behavioral disturbance: Secondary | ICD-10-CM | POA: Diagnosis not present

## 2020-02-14 DIAGNOSIS — I129 Hypertensive chronic kidney disease with stage 1 through stage 4 chronic kidney disease, or unspecified chronic kidney disease: Secondary | ICD-10-CM | POA: Diagnosis not present

## 2020-02-14 DIAGNOSIS — F250 Schizoaffective disorder, bipolar type: Secondary | ICD-10-CM | POA: Diagnosis not present

## 2020-02-14 DIAGNOSIS — E1122 Type 2 diabetes mellitus with diabetic chronic kidney disease: Secondary | ICD-10-CM | POA: Diagnosis not present

## 2020-02-14 DIAGNOSIS — M5116 Intervertebral disc disorders with radiculopathy, lumbar region: Secondary | ICD-10-CM | POA: Diagnosis not present

## 2020-02-14 DIAGNOSIS — M19019 Primary osteoarthritis, unspecified shoulder: Secondary | ICD-10-CM | POA: Diagnosis not present

## 2020-02-14 DIAGNOSIS — N183 Chronic kidney disease, stage 3 unspecified: Secondary | ICD-10-CM | POA: Diagnosis not present

## 2020-02-14 DIAGNOSIS — E1142 Type 2 diabetes mellitus with diabetic polyneuropathy: Secondary | ICD-10-CM | POA: Diagnosis not present

## 2020-02-14 DIAGNOSIS — M792 Neuralgia and neuritis, unspecified: Secondary | ICD-10-CM | POA: Diagnosis not present

## 2020-02-14 DIAGNOSIS — E785 Hyperlipidemia, unspecified: Secondary | ICD-10-CM | POA: Diagnosis not present

## 2020-02-15 ENCOUNTER — Other Ambulatory Visit: Payer: Self-pay | Admitting: *Deleted

## 2020-02-15 DIAGNOSIS — E1122 Type 2 diabetes mellitus with diabetic chronic kidney disease: Secondary | ICD-10-CM | POA: Diagnosis not present

## 2020-02-15 DIAGNOSIS — N183 Chronic kidney disease, stage 3 unspecified: Secondary | ICD-10-CM | POA: Diagnosis not present

## 2020-02-15 DIAGNOSIS — E1142 Type 2 diabetes mellitus with diabetic polyneuropathy: Secondary | ICD-10-CM | POA: Diagnosis not present

## 2020-02-15 DIAGNOSIS — M19019 Primary osteoarthritis, unspecified shoulder: Secondary | ICD-10-CM | POA: Diagnosis not present

## 2020-02-15 DIAGNOSIS — E785 Hyperlipidemia, unspecified: Secondary | ICD-10-CM | POA: Diagnosis not present

## 2020-02-15 DIAGNOSIS — M5116 Intervertebral disc disorders with radiculopathy, lumbar region: Secondary | ICD-10-CM | POA: Diagnosis not present

## 2020-02-15 DIAGNOSIS — I129 Hypertensive chronic kidney disease with stage 1 through stage 4 chronic kidney disease, or unspecified chronic kidney disease: Secondary | ICD-10-CM | POA: Diagnosis not present

## 2020-02-15 DIAGNOSIS — F0390 Unspecified dementia without behavioral disturbance: Secondary | ICD-10-CM | POA: Diagnosis not present

## 2020-02-15 DIAGNOSIS — M792 Neuralgia and neuritis, unspecified: Secondary | ICD-10-CM | POA: Diagnosis not present

## 2020-02-16 ENCOUNTER — Telehealth: Payer: Self-pay

## 2020-02-16 NOTE — Telephone Encounter (Signed)
Copied from [place] [phone]. Topic: General - Other >> Feb 15, 2020  3:46 PM Gillis Ends D wrote: Reason for CRM: His social work visit will be delayed to accoidate his Sister so she can be there. It will be next Thursday. This call was made by Jon Billings. Please advise

## 2020-02-16 NOTE — Patient Outreach (Addendum)
[place] [facility]) Care Management  02/16/2020  Buddie Marston Ardian [place]erland. 13-Sep-1965 [id number]   CSW spoke with pt by phone.  Pt's identity was confirmed and CSW introduced self, role and reason for call; PCS needs.   Per pt, this has been resolved and they were able to get the [facility] services re-enacted and he currently is getting about 2 hours per day, 7 days per week.  "I could use more but this is helpful".  CSW advised pt that PCS has to approve the number of hours provided as well as find staffing which is challenging at this time.  Pt shares that he lives alone and has a life alert.  CSW reminded pt to take advantage of his insurance benefits which may include some personal care, as well as transportation and OTC catalog monies.  CSW encouraged pt to login to the [facility] website and/or call the Customer Service # on back of his card for further info/details.  Pt shared that he knows people who get more benefits and CSW provided him with info for [facility] where he can compare the different Medicare programs and they can assist him with analyzing which is best for his needs.    CSW will sign off at this time.  Pt is equipped with resources and services and denies any further concerns or needs.   CSW will advise PCP and [facility] team of above.  Please re-consult if needs arise.   Eduard Clos, MSW, [place] Worker  [place] [phone]

## 2020-02-17 NOTE — Telephone Encounter (Signed)
Call placed to patient. Patient verified that the appointment with PCP scheduled for Wednesday, November 10, 21 and he placed it in his calendar on phone. He agreed to contact clinic if he needs to re-schedule. LCSW scheduled appointment to meet with him on the same day to follow up with behavioral health and/or resource needs. No additional concerns noted.

## 2020-02-18 DIAGNOSIS — E785 Hyperlipidemia, unspecified: Secondary | ICD-10-CM | POA: Diagnosis not present

## 2020-02-18 DIAGNOSIS — F0390 Unspecified dementia without behavioral disturbance: Secondary | ICD-10-CM | POA: Diagnosis not present

## 2020-02-18 DIAGNOSIS — E1122 Type 2 diabetes mellitus with diabetic chronic kidney disease: Secondary | ICD-10-CM | POA: Diagnosis not present

## 2020-02-18 DIAGNOSIS — N183 Chronic kidney disease, stage 3 unspecified: Secondary | ICD-10-CM | POA: Diagnosis not present

## 2020-02-18 DIAGNOSIS — M19019 Primary osteoarthritis, unspecified shoulder: Secondary | ICD-10-CM | POA: Diagnosis not present

## 2020-02-18 DIAGNOSIS — I129 Hypertensive chronic kidney disease with stage 1 through stage 4 chronic kidney disease, or unspecified chronic kidney disease: Secondary | ICD-10-CM | POA: Diagnosis not present

## 2020-02-18 DIAGNOSIS — M792 Neuralgia and neuritis, unspecified: Secondary | ICD-10-CM | POA: Diagnosis not present

## 2020-02-18 DIAGNOSIS — E1142 Type 2 diabetes mellitus with diabetic polyneuropathy: Secondary | ICD-10-CM | POA: Diagnosis not present

## 2020-02-18 DIAGNOSIS — M5116 Intervertebral disc disorders with radiculopathy, lumbar region: Secondary | ICD-10-CM | POA: Diagnosis not present

## 2020-02-19 ENCOUNTER — Other Ambulatory Visit: Payer: Self-pay | Admitting: Physician Assistant

## 2020-02-19 DIAGNOSIS — Z794 Long term (current) use of insulin: Secondary | ICD-10-CM

## 2020-02-19 DIAGNOSIS — N1831 Chronic kidney disease, stage 3a: Secondary | ICD-10-CM

## 2020-02-19 NOTE — Telephone Encounter (Signed)
Requested Prescriptions  Pending Prescriptions Disp Refills  . TRADJENTA 5 MG TABS tablet [Pharmacy Med Name: TRADJENTA 5 MG Tablet] 90 tablet 1    Sig: TAKE 1 TABLET (5 MG TOTAL) BY MOUTH DAILY.     Endocrinology:  Diabetes - DPP-4 Inhibitors - linagliptin Passed - 02/19/2020 12:28 PM      Passed - HBA1C is between 0 and 7.9 and within 180 days    HbA1c, POC (controlled diabetic range)  Date Value Ref Range Status  11/01/2019 9.0 (A) 0.0 - 7.0 % Final   Hgb A1c MFr Bld  Date Value Ref Range Status  01/03/2020 7.9 (H) 4.8 - 5.6 % Final    Comment:    (NOTE) Pre diabetes:          5.7%-6.4%  Diabetes:              >6.4%  Glycemic control for   <7.0% adults with diabetes          Passed - Valid encounter within last 6 months    Recent Outpatient Visits          3 months ago Essential hypertension   [place], [place], MD   3 months ago Type 2 diabetes mellitus with stage 3a chronic kidney disease, with long-term current use of insulin ([place])   [place], [place], MD   4 months ago Type 2 diabetes mellitus with stage 3a chronic kidney disease, with long-term current use of insulin ([place])   [place], [place], [place]   5 months ago Type 2 diabetes mellitus with stage 3a chronic kidney disease, with long-term current use of insulin ([place])   [place], [place], MD   6 months ago Depression with anxiety   [place], [place], MD      Future Appointments            In 1 week [name], MD [place]

## 2020-02-20 ENCOUNTER — Inpatient Hospital Stay: Payer: Medicare HMO | Admitting: Family Medicine

## 2020-02-20 DIAGNOSIS — E785 Hyperlipidemia, unspecified: Secondary | ICD-10-CM | POA: Diagnosis not present

## 2020-02-20 DIAGNOSIS — F0390 Unspecified dementia without behavioral disturbance: Secondary | ICD-10-CM | POA: Diagnosis not present

## 2020-02-20 DIAGNOSIS — M5116 Intervertebral disc disorders with radiculopathy, lumbar region: Secondary | ICD-10-CM | POA: Diagnosis not present

## 2020-02-20 DIAGNOSIS — M19019 Primary osteoarthritis, unspecified shoulder: Secondary | ICD-10-CM | POA: Diagnosis not present

## 2020-02-20 DIAGNOSIS — M792 Neuralgia and neuritis, unspecified: Secondary | ICD-10-CM | POA: Diagnosis not present

## 2020-02-20 DIAGNOSIS — I129 Hypertensive chronic kidney disease with stage 1 through stage 4 chronic kidney disease, or unspecified chronic kidney disease: Secondary | ICD-10-CM | POA: Diagnosis not present

## 2020-02-20 DIAGNOSIS — N183 Chronic kidney disease, stage 3 unspecified: Secondary | ICD-10-CM | POA: Diagnosis not present

## 2020-02-20 DIAGNOSIS — E1142 Type 2 diabetes mellitus with diabetic polyneuropathy: Secondary | ICD-10-CM | POA: Diagnosis not present

## 2020-02-20 DIAGNOSIS — E1122 Type 2 diabetes mellitus with diabetic chronic kidney disease: Secondary | ICD-10-CM | POA: Diagnosis not present

## 2020-02-21 DIAGNOSIS — M19019 Primary osteoarthritis, unspecified shoulder: Secondary | ICD-10-CM | POA: Diagnosis not present

## 2020-02-21 DIAGNOSIS — I129 Hypertensive chronic kidney disease with stage 1 through stage 4 chronic kidney disease, or unspecified chronic kidney disease: Secondary | ICD-10-CM | POA: Diagnosis not present

## 2020-02-21 DIAGNOSIS — E785 Hyperlipidemia, unspecified: Secondary | ICD-10-CM | POA: Diagnosis not present

## 2020-02-21 DIAGNOSIS — E1142 Type 2 diabetes mellitus with diabetic polyneuropathy: Secondary | ICD-10-CM | POA: Diagnosis not present

## 2020-02-21 DIAGNOSIS — F0390 Unspecified dementia without behavioral disturbance: Secondary | ICD-10-CM | POA: Diagnosis not present

## 2020-02-21 DIAGNOSIS — M5116 Intervertebral disc disorders with radiculopathy, lumbar region: Secondary | ICD-10-CM | POA: Diagnosis not present

## 2020-02-21 DIAGNOSIS — N183 Chronic kidney disease, stage 3 unspecified: Secondary | ICD-10-CM | POA: Diagnosis not present

## 2020-02-21 DIAGNOSIS — M792 Neuralgia and neuritis, unspecified: Secondary | ICD-10-CM | POA: Diagnosis not present

## 2020-02-21 DIAGNOSIS — E1122 Type 2 diabetes mellitus with diabetic chronic kidney disease: Secondary | ICD-10-CM | POA: Diagnosis not present

## 2020-02-23 DIAGNOSIS — M19019 Primary osteoarthritis, unspecified shoulder: Secondary | ICD-10-CM | POA: Diagnosis not present

## 2020-02-23 DIAGNOSIS — I129 Hypertensive chronic kidney disease with stage 1 through stage 4 chronic kidney disease, or unspecified chronic kidney disease: Secondary | ICD-10-CM | POA: Diagnosis not present

## 2020-02-23 DIAGNOSIS — M5116 Intervertebral disc disorders with radiculopathy, lumbar region: Secondary | ICD-10-CM | POA: Diagnosis not present

## 2020-02-23 DIAGNOSIS — E1142 Type 2 diabetes mellitus with diabetic polyneuropathy: Secondary | ICD-10-CM | POA: Diagnosis not present

## 2020-02-23 DIAGNOSIS — F0390 Unspecified dementia without behavioral disturbance: Secondary | ICD-10-CM | POA: Diagnosis not present

## 2020-02-23 DIAGNOSIS — E785 Hyperlipidemia, unspecified: Secondary | ICD-10-CM | POA: Diagnosis not present

## 2020-02-23 DIAGNOSIS — E1122 Type 2 diabetes mellitus with diabetic chronic kidney disease: Secondary | ICD-10-CM | POA: Diagnosis not present

## 2020-02-23 DIAGNOSIS — M792 Neuralgia and neuritis, unspecified: Secondary | ICD-10-CM | POA: Diagnosis not present

## 2020-02-23 DIAGNOSIS — N183 Chronic kidney disease, stage 3 unspecified: Secondary | ICD-10-CM | POA: Diagnosis not present

## 2020-02-24 DIAGNOSIS — M5116 Intervertebral disc disorders with radiculopathy, lumbar region: Secondary | ICD-10-CM | POA: Diagnosis not present

## 2020-02-24 DIAGNOSIS — M6281 Muscle weakness (generalized): Secondary | ICD-10-CM | POA: Diagnosis not present

## 2020-02-24 DIAGNOSIS — F0390 Unspecified dementia without behavioral disturbance: Secondary | ICD-10-CM | POA: Diagnosis not present

## 2020-02-24 DIAGNOSIS — E1142 Type 2 diabetes mellitus with diabetic polyneuropathy: Secondary | ICD-10-CM | POA: Diagnosis not present

## 2020-02-24 DIAGNOSIS — N183 Chronic kidney disease, stage 3 unspecified: Secondary | ICD-10-CM | POA: Diagnosis not present

## 2020-02-24 DIAGNOSIS — G8929 Other chronic pain: Secondary | ICD-10-CM | POA: Diagnosis not present

## 2020-02-24 DIAGNOSIS — I129 Hypertensive chronic kidney disease with stage 1 through stage 4 chronic kidney disease, or unspecified chronic kidney disease: Secondary | ICD-10-CM | POA: Diagnosis not present

## 2020-02-24 DIAGNOSIS — M19019 Primary osteoarthritis, unspecified shoulder: Secondary | ICD-10-CM | POA: Diagnosis not present

## 2020-02-24 DIAGNOSIS — G9349 Other encephalopathy: Secondary | ICD-10-CM | POA: Diagnosis not present

## 2020-02-24 DIAGNOSIS — E785 Hyperlipidemia, unspecified: Secondary | ICD-10-CM | POA: Diagnosis not present

## 2020-02-24 DIAGNOSIS — M792 Neuralgia and neuritis, unspecified: Secondary | ICD-10-CM | POA: Diagnosis not present

## 2020-02-24 DIAGNOSIS — E1122 Type 2 diabetes mellitus with diabetic chronic kidney disease: Secondary | ICD-10-CM | POA: Diagnosis not present

## 2020-02-27 DIAGNOSIS — F0390 Unspecified dementia without behavioral disturbance: Secondary | ICD-10-CM | POA: Diagnosis not present

## 2020-02-27 DIAGNOSIS — E785 Hyperlipidemia, unspecified: Secondary | ICD-10-CM | POA: Diagnosis not present

## 2020-02-27 DIAGNOSIS — E1122 Type 2 diabetes mellitus with diabetic chronic kidney disease: Secondary | ICD-10-CM | POA: Diagnosis not present

## 2020-02-27 DIAGNOSIS — N183 Chronic kidney disease, stage 3 unspecified: Secondary | ICD-10-CM | POA: Diagnosis not present

## 2020-02-27 DIAGNOSIS — I129 Hypertensive chronic kidney disease with stage 1 through stage 4 chronic kidney disease, or unspecified chronic kidney disease: Secondary | ICD-10-CM | POA: Diagnosis not present

## 2020-02-27 DIAGNOSIS — E1142 Type 2 diabetes mellitus with diabetic polyneuropathy: Secondary | ICD-10-CM | POA: Diagnosis not present

## 2020-02-27 DIAGNOSIS — M19019 Primary osteoarthritis, unspecified shoulder: Secondary | ICD-10-CM | POA: Diagnosis not present

## 2020-02-27 DIAGNOSIS — M792 Neuralgia and neuritis, unspecified: Secondary | ICD-10-CM | POA: Diagnosis not present

## 2020-02-27 DIAGNOSIS — M5116 Intervertebral disc disorders with radiculopathy, lumbar region: Secondary | ICD-10-CM | POA: Diagnosis not present

## 2020-02-28 DIAGNOSIS — F0390 Unspecified dementia without behavioral disturbance: Secondary | ICD-10-CM | POA: Diagnosis not present

## 2020-02-28 DIAGNOSIS — I129 Hypertensive chronic kidney disease with stage 1 through stage 4 chronic kidney disease, or unspecified chronic kidney disease: Secondary | ICD-10-CM | POA: Diagnosis not present

## 2020-02-28 DIAGNOSIS — N183 Chronic kidney disease, stage 3 unspecified: Secondary | ICD-10-CM | POA: Diagnosis not present

## 2020-02-28 DIAGNOSIS — M5116 Intervertebral disc disorders with radiculopathy, lumbar region: Secondary | ICD-10-CM | POA: Diagnosis not present

## 2020-02-28 DIAGNOSIS — E1122 Type 2 diabetes mellitus with diabetic chronic kidney disease: Secondary | ICD-10-CM | POA: Diagnosis not present

## 2020-02-28 DIAGNOSIS — M19019 Primary osteoarthritis, unspecified shoulder: Secondary | ICD-10-CM | POA: Diagnosis not present

## 2020-02-28 DIAGNOSIS — E1142 Type 2 diabetes mellitus with diabetic polyneuropathy: Secondary | ICD-10-CM | POA: Diagnosis not present

## 2020-02-28 DIAGNOSIS — E785 Hyperlipidemia, unspecified: Secondary | ICD-10-CM | POA: Diagnosis not present

## 2020-02-28 DIAGNOSIS — M792 Neuralgia and neuritis, unspecified: Secondary | ICD-10-CM | POA: Diagnosis not present

## 2020-02-29 ENCOUNTER — Encounter: Payer: Self-pay | Admitting: Family Medicine

## 2020-02-29 ENCOUNTER — Ambulatory Visit: Payer: Medicare HMO | Attending: Family Medicine | Admitting: Family Medicine

## 2020-02-29 ENCOUNTER — Encounter: Payer: Medicare HMO | Admitting: Licensed Clinical Social Worker

## 2020-02-29 ENCOUNTER — Other Ambulatory Visit: Payer: Self-pay

## 2020-02-29 VITALS — BP 139/91 | HR 84 | Ht 77.0 in | Wt 201.0 lb

## 2020-02-29 DIAGNOSIS — E1122 Type 2 diabetes mellitus with diabetic chronic kidney disease: Secondary | ICD-10-CM

## 2020-02-29 DIAGNOSIS — F411 Generalized anxiety disorder: Secondary | ICD-10-CM | POA: Diagnosis not present

## 2020-02-29 DIAGNOSIS — R296 Repeated falls: Secondary | ICD-10-CM | POA: Diagnosis not present

## 2020-02-29 DIAGNOSIS — I152 Hypertension secondary to endocrine disorders: Secondary | ICD-10-CM

## 2020-02-29 DIAGNOSIS — F250 Schizoaffective disorder, bipolar type: Secondary | ICD-10-CM

## 2020-02-29 DIAGNOSIS — I129 Hypertensive chronic kidney disease with stage 1 through stage 4 chronic kidney disease, or unspecified chronic kidney disease: Secondary | ICD-10-CM

## 2020-02-29 DIAGNOSIS — E349 Endocrine disorder, unspecified: Secondary | ICD-10-CM

## 2020-02-29 DIAGNOSIS — M5136 Other intervertebral disc degeneration, lumbar region: Secondary | ICD-10-CM | POA: Diagnosis not present

## 2020-02-29 DIAGNOSIS — R413 Other amnesia: Secondary | ICD-10-CM

## 2020-02-29 DIAGNOSIS — Z125 Encounter for screening for malignant neoplasm of prostate: Secondary | ICD-10-CM | POA: Diagnosis not present

## 2020-02-29 DIAGNOSIS — M51369 Other intervertebral disc degeneration, lumbar region without mention of lumbar back pain or lower extremity pain: Secondary | ICD-10-CM

## 2020-02-29 DIAGNOSIS — N183 Chronic kidney disease, stage 3 unspecified: Secondary | ICD-10-CM | POA: Diagnosis not present

## 2020-02-29 DIAGNOSIS — N1831 Chronic kidney disease, stage 3a: Secondary | ICD-10-CM

## 2020-02-29 DIAGNOSIS — Z794 Long term (current) use of insulin: Secondary | ICD-10-CM

## 2020-02-29 DIAGNOSIS — E1159 Type 2 diabetes mellitus with other circulatory complications: Secondary | ICD-10-CM | POA: Diagnosis not present

## 2020-02-29 MED ORDER — TRAZODONE HCL 150 MG PO TABS
150.0000 mg | ORAL_TABLET | Freq: Every day | ORAL | 1 refills | Status: DC
Start: 2020-02-29 — End: 2020-05-11

## 2020-02-29 MED ORDER — TRAMADOL HCL 50 MG PO TABS
50.0000 mg | ORAL_TABLET | Freq: Every evening | ORAL | 2 refills | Status: DC | PRN
Start: 2020-02-29 — End: 2020-02-29

## 2020-02-29 MED ORDER — LEVEMIR FLEXTOUCH 100 UNIT/ML ~~LOC~~ SOPN: PEN_INJECTOR | SUBCUTANEOUS | 1 refills | Status: DC

## 2020-02-29 MED ORDER — ATORVASTATIN CALCIUM 10 MG PO TABS: 10.0000 mg | ORAL_TABLET | Freq: Every day | ORAL | 1 refills | Status: DC

## 2020-02-29 MED ORDER — TRAMADOL HCL 50 MG PO TABS: 50.0000 mg | ORAL_TABLET | Freq: Every evening | ORAL | 2 refills | Status: DC | PRN

## 2020-02-29 MED ORDER — LISINOPRIL 5 MG PO TABS: 5.0000 mg | ORAL_TABLET | Freq: Every day | ORAL | 1 refills | Status: DC

## 2020-02-29 MED ORDER — NOVOLOG FLEXPEN 100 UNIT/ML ~~LOC~~ SOPN: 0.0000 [IU] | PEN_INJECTOR | Freq: Two times a day (BID) | SUBCUTANEOUS | 3 refills | Status: DC

## 2020-02-29 MED ORDER — TESTOSTERONE 20.25 MG/1.25GM (1.62%) TD GEL: 20.2500 mg | Freq: Every morning | TRANSDERMAL | 3 refills | Status: DC

## 2020-02-29 MED ORDER — GABAPENTIN 300 MG PO CAPS: 300.0000 mg | ORAL_CAPSULE | Freq: Two times a day (BID) | ORAL | 1 refills | Status: DC

## 2020-02-29 MED ORDER — TIZANIDINE HCL 4 MG PO TABS: 4.0000 mg | ORAL_TABLET | Freq: Three times a day (TID) | ORAL | 3 refills | Status: DC | PRN

## 2020-02-29 MED ORDER — BD PEN NEEDLE MINI U/F 31G X 5 MM MISC: 12 refills | Status: DC

## 2020-02-29 MED ORDER — LINAGLIPTIN 5 MG PO TABS: ORAL_TABLET | ORAL | 1 refills | Status: DC

## 2020-02-29 NOTE — Patient Instructions (Signed)
Preventing Falls and Fractures  Falls can be very serious, especially for older adults or people with osteoporosis  Falls can be caused by:  Tripping or slipping  Slow reflexes  Balance problems  Reduced muscle strength  Poor vision or a recent change in prescription  Illness and some medications (especially blood pressure pills, diuretics, heart medicines, muscle relaxants and sleep medications)  Drinking alcohol  To prevent falls outdoors:  Use a can or walker if needed  Wear rubber-soled shoes so you don't slip  DO NOT buy "shape up" shoes with rocker bottom soles if you have balance problems.  The thick soles and shape make it more difficult to keep your balance.  Put kitty litter or salt on icy sidewalks  Walk on the grass if the sidewalks are slick  Avoid walking on uneven ground whenever possible  T prevent falls indoors:  Keep rooms clutter-free, especially hallways, stairs and paths to light switches  Remove throw rugs  Install night lights, especially to and in the bathroom  Turn on lights before going downstairs  Keep a flashlight next to your bed  Buy a cordless phone to keep with you instead of jumping up to answer the phone  Install grab bars in the bathroom near the shower and toilet  Install rails on both sides of the stairs.  Make sure the stairs are well lit  Wear slippers with non-skid soles.  Do not walk around in stockings or socks  Balance problems and dizziness are not a normal part of growing older.  If you begin having balance problems or dizziness see your doctor.  Physical Therapy can help you with many balance problems, strengthening hip and leg muscles and with gait training.  To keep your bones healthy make sure you are getting enough calcium and Vitamin D each day.  Ask your doctor or pharmacist about supplements.  Regular weight-bearing exercise like walking, lifting weights or dancing can help strengthen bones and prevent  osteoporosis.  

## 2020-02-29 NOTE — Progress Notes (Signed)
Subjective:  Patient ID: [name]., male    DOB: April 06, 1966  Age: 54 y.o. MRN: [id number]  CC: Hospitalization Follow-up   HPI Gareld Obrecht. 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  Also had rhabdomyolysis which was treated with IVF.  He was discharged to skilled nursing facility. He was discharged from [facility] last month. He is requesting additional hours as he has had more falls lately and his memory has worsened. He has forgotten to turn of the stove off on one occasion and he forgets to take his medications and at other times takes additional doses due to poor memory.  Memory problems evaluated by neurology 2 years ago and thought to be related to his schizoaffective disorder. Currently receives 2 hours/ day 7 days a week.- he receives PCS from [place].  He has transferred Psych care to [facility] as his psychiatrist at his current behavioral [facility] left.  He has run out of his psych medications and has an upcoming appointment in 2 weeks with psychiatry. Complains of a 2 week history of left lower quadrant pain which occurs when he tries to sit up pain is absent at this time.  His antihypertensive has been on hold as he did has experienced hypotension in the past but blood pressure today slightly elevated at 139/91. He is compliant with his diabetic medications and has no complaints of hypoglycemia. Accompanied to today's visit by his sister who provides most of the history Past Medical History:  Diagnosis Date  . ADD (attention deficit disorder)   . Anxiety   . Arthritis    right hip    . Bipolar 1 disorder ([place])   . Blood in urine   . CKD (chronic kidney disease), stage III ([place])   . Creatinine elevation   . Dementia ([place])    "early onset" (08/04/2017)  . Depression    bipolar [facility]  . Diabetes mellitus without complication ([place])   . Family history of anesthesia complication    pt is unsure , but pt father may have been difficult to arouse   . HCAP (healthcare-associated pneumonia) 10/31/2012  . History of kidney stones   . Hypertension   . Hypogonadism male   . Liver fatty degeneration   . Microscopic hematuria    hereditary s/p Urology eval  . Neuromuscular disorder ([place])    feet neuropathy   . Osteoarthritis of right hip 11/28/2011   2012 2015 s/p THR Severe  Dr Novella Olive    . Pleural effusion 11/02/2012  . Pneumonia 10-2012  . Pneumonia, organism unspecified(486) 11/02/2012  . Polysubstance dependence, non-opioid, in remission ([place])    remote  . Primary osteoarthritis of left hip 2015/06/07  . PTSD (post-traumatic stress disorder)    SOCIAL ANXIETY DISORDER   . Schizoaffective disorder ([place])   . Substance abuse ([place])   . Suicide attempt by multiple drug overdose ([place]) 01-11-16   Grieving his cat's death 06-07-2015    Past Surgical History:  Procedure Laterality Date  . BACK SURGERY    . CLOSED REDUCTION METACARPAL WITH PERCUTANEOUS PINNING Right   . LUMBAR DISC SURGERY    .  TONSILLECTOMY    . TOTAL HIP ARTHROPLASTY Right 08/16/2013   Procedure: TOTAL HIP ARTHROPLASTY ANTERIOR APPROACH;  Surgeon: Hessie Dibble, MD;  Location: [facility];  Service: Orthopedics;  Laterality: Right;  . TOTAL HIP ARTHROPLASTY Left 05/22/2015   Procedure: TOTAL HIP ARTHROPLASTY ANTERIOR APPROACH;  Surgeon: Melrose Nakayama, MD;  Location: [facility];  Service: Orthopedics;  Laterality: Left;    Family History  Problem Relation Age of Onset  . Diabetes Father   . Cancer Mother        died of melanoma with mets  . Cervical cancer Sister   . Diabetes Sister   . Other Neg Hx         hypogonadism  . Colon cancer Neg Hx   . Colon polyps Neg Hx   . Esophageal cancer Neg Hx   . Rectal cancer Neg Hx   . Stomach cancer Neg Hx     Allergies  Allergen Reactions  . Vicodin [Hydrocodone-Acetaminophen] Itching    Outpatient Medications Prior to Visit  Medication Sig Dispense Refill  . acetaminophen (TYLENOL) 325 MG tablet Take 2 tablets (650 mg total) by mouth every 6 (six) hours as needed for mild pain (or Fever >/= 101).    Marland Kitchen alfuzosin (UROXATRAL) 10 MG 24 hr tablet Take 10 mg by mouth at bedtime.     . ARIPiprazole (ABILIFY) 5 MG tablet TAKE 1 TABLET BY MOUTH DAILY FOR DEPRESSION (Patient taking differently: Take 5 mg by mouth daily. ) 30 tablet 0  . Blood Glucose Monitoring Suppl (ACCU-CHEK GUIDE ME) w/Device KIT Use to check blood sugar TID. E11.21 1 kit 0  . buPROPion (WELLBUTRIN XL) 300 MG 24 hr tablet Take 1 tablet (300 mg total) by mouth daily. 30 tablet 0  . busPIRone (BUSPAR) 15 MG tablet Take 15 mg by mouth 2 (two) times daily.     . Continuous Blood Gluc Receiver (FREESTYLE LIBRE 14 DAY READER) DEVI Use as directed 1 each 4  . Continuous Blood Gluc Sensor (FREESTYLE LIBRE 14 DAY SENSOR) MISC Use as directed 2 each 4  . diclofenac sodium (VOLTAREN) 1 % GEL Apply 4 g topically 4 (four) times daily. 100 g 1  . divalproex (DEPAKOTE) 500 MG DR tablet Take 3 tablets (1,500 mg total) by mouth at bedtime. 90 tablet 0  . feeding supplement, ENSURE ENLIVE, (ENSURE ENLIVE) LIQD Take 237 mLs by mouth 2 (two) times daily between meals. 237 mL 12  . ferrous sulfate 325 (65 FE) MG tablet Take 1 tablet (325 mg total) by mouth daily with breakfast. 30 tablet 11  . fluticasone (FLONASE) 50 MCG/ACT nasal spray Place 1 spray into both nostrils daily. SHAKE LIQUID AND USE 2 SPRAYS IN EACH NOSTRIL DAILY 48 g 0  . glucose blood (ACCU-CHEK GUIDE) test strip CHECK SUGAR FOUR TIMES DAILY 100 each 12  . lidocaine (LIDODERM) 5 % Place 1 patch onto the skin daily. Remove & Discard patch  within 12 hours or as directed by MD 30 patch 0  . NARCAN 4 MG/0.1ML LIQD nasal spray kit CALL 911. ADMINISTER A SINGLE SPRAY OF NARCAN IN ONE NOSTRIL. REPEAT EVERY 3 MINUTES AS NEEDED IF NO OR MINIMAL RESPONSE.    . tadalafil (CIALIS) 5 MG tablet TAKE ONE TABLET BY MOUTH EVERY DAY FOR BPH 90 tablet 0  . atorvastatin (LIPITOR) 10 MG tablet Take 1 tablet (10 mg total) by mouth at bedtime. 90 tablet 1  . B-D UF III MINI PEN NEEDLES 31G X 5 MM MISC  USE FOUR TIMES DAILY 100 each 12  . gabapentin (NEURONTIN) 300 MG capsule TAKE 1 CAPSULE (300 MG TOTAL) BY MOUTH 2 (TWO) TIMES DAILY. 180 capsule 1  . insulin aspart (NOVOLOG FLEXPEN) 100 UNIT/ML FlexPen Inject 0-12 Units into the skin 2 (two) times daily. per sliding scale 6 pen 3  . LEVEMIR FLEXTOUCH 100 UNIT/ML FlexPen INJECT 45 UNITS INTO THE SKIN 2 TIMES DAILY. 90 mL 0  . lisinopril-hydrochlorothiazide (ZESTORETIC) 10-12.5 MG tablet TAKE 1 TABLET EVERY DAY 90 tablet 0  . methocarbamol (ROBAXIN) 500 MG tablet Take 2 tablets (1,000 mg total) by mouth 2 (two) times daily. 120 tablet 3  . omeprazole (PRILOSEC) 40 MG capsule TAKE 1 CAPSULE(40 MG) BY MOUTH DAILY 90 capsule 1  . Testosterone (ANDROGEL) 20.25 MG/1.25GM (1.62%) GEL Apply 20.25 mg topically every morning. Apply topically under arm pit alternating with each application. 1.25 g 3  . TRADJENTA 5 MG TABS tablet TAKE 1 TABLET (5 MG TOTAL) BY MOUTH DAILY. 90 tablet 1  . traMADol (ULTRAM) 50 MG tablet Take 1 tablet (50 mg total) by mouth at bedtime as needed (pain). 30 tablet 1  . traZODone (DESYREL) 150 MG tablet Take 1 tablet (150 mg total) by mouth at bedtime. 90 tablet 0   No facility-administered medications prior to visit.     ROS Review of Systems  Constitutional: Negative for activity change and appetite change.  HENT: Negative for sinus pressure and sore throat.   Eyes: Negative for visual disturbance.  Respiratory: Negative for cough, chest tightness and shortness of breath.     Cardiovascular: Negative for chest pain and leg swelling.  Gastrointestinal: Negative for abdominal distention, abdominal pain, constipation and diarrhea.  Endocrine: Negative.   Genitourinary: Negative for dysuria.  Musculoskeletal: Positive for back pain and gait problem. Negative for joint swelling and myalgias.  Skin: Negative for rash.  Allergic/Immunologic: Negative.   Neurological: Positive for weakness and numbness. Negative for light-headedness.  Psychiatric/Behavioral: Negative for dysphoric mood and suicidal ideas.    Objective:  BP (!) 139/91   Pulse 84   Ht 6' 5" (1.956 m)   Wt 201 lb (91.2 kg)   SpO2 99%   BMI 23.84 kg/m   BP/Weight 02/29/2020 02/01/2020 01/04/568  Systolic BP 794 801 655  Diastolic BP 91 90 80  Wt. (Lbs) 201 203 -  BMI 23.84 24.07 -      Physical Exam Constitutional:      Appearance: He is well-developed.  Neck:     Vascular: No JVD.  Cardiovascular:     Rate and Rhythm: Normal rate.     Heart sounds: Normal heart sounds. No murmur heard.   Pulmonary:     Effort: Pulmonary effort is normal.     Breath sounds: Normal breath sounds. No wheezing or rales.  Chest:     Chest wall: No tenderness.  Abdominal:     General: Bowel sounds are normal. There is no distension.     Palpations: Abdomen is soft. There is no mass.     Tenderness: There is no abdominal tenderness.  Musculoskeletal:     Right lower leg: No edema.     Left lower leg: No edema.     Comments: Tenderness to palpation of lumbar spine  Neurological:     Mental Status: He is alert and oriented to person, place, and time. Mental status is at baseline.     Motor: Weakness present.     Gait: Gait abnormal.  Psychiatric:  Mood and Affect: Mood normal.     CMP Latest Ref Rng & Units 01/04/2020 01/03/2020 01/02/2020  Glucose 70 - 99 mg/dL 210(H) 147(H) 162(H)  BUN 6 - 20 mg/dL 27(H) 31(H) 35(H)  Creatinine 0.61 - 1.24 mg/dL 1.48(H) 1.43(H) 1.64(H)  Sodium 135 - 145  mmol/L 134(L) 138 139  Potassium 3.5 - 5.1 mmol/L 3.9 4.7 4.2  Chloride 98 - 111 mmol/L 100 105 104  CO2 22 - 32 mmol/L _0 Calcium 8.9 - 10.3 mg/dL 9.1 9.1 8.9  Total Protein 6.5 - 8.1 g/dL - - -  Total Bilirubin 0.3 - 1.2 mg/dL - - -  Alkaline Phos 38 - 126 U/L - - -  AST 15 - 41 U/L - - -  ALT 0 - 44 U/L - - -    Lipid Panel     Component Value Date/Time   CHOL 135 08/25/2019 1134   TRIG 249 (H) 08/25/2019 1134   HDL 37 (L) 08/25/2019 1134   CHOLHDL 3.6 08/25/2019 1134   CHOLHDL 7 06/02/2016 0844   VLDL 41 (H) 10/03/2015 0434   LDLCALC 58 08/25/2019 1134   LDLDIRECT 72.0 06/02/2016 0844    CBC    Component Value Date/Time   WBC 8.0 01/01/2020 1520   RBC 4.73 01/01/2020 1520   HGB 13.9 01/01/2020 1520   HGB 16.5 05/12/2018 1647   HCT 41.2 01/01/2020 1520   HCT 47.9 05/12/2018 1647   PLT 124 (L) 01/01/2020 1520   PLT 123 (L) 05/12/2018 1647   MCV 87.1 01/01/2020 1520   MCV 95 05/12/2018 1647   MCH 29.4 01/01/2020 1520   MCHC 33.7 01/01/2020 1520   RDW 13.2 01/01/2020 1520   RDW 15.4 05/12/2018 1647   LYMPHSABS 1.9 05/17/2019 0532   LYMPHSABS 2.3 05/12/2018 1647   MONOABS 0.4 05/17/2019 0532   EOSABS 0.0 05/17/2019 0532   EOSABS 0.0 05/12/2018 1647   BASOSABS 0.0 05/17/2019 0532   BASOSABS 0.0 05/12/2018 1647    Lab Results  Component Value Date   HGBA1C 7.9 (H) 01/03/2020    Assessment & Plan:  1. Type 2 diabetes mellitus with stage 3a chronic kidney disease, with long-term current use of insulin (HCC) A1c of 7.9 which is slightly above goal; for him his goal is 7.5 due to multiple comorbidities No regimen change today to prevent hypoglycemia We will review at next visit Counseled on Diabetic diet, my plate method, 048 minutes of moderate intensity exercise/week Blood sugar logs with fasting goals of 80-120 mg/dl, random of less than 180 and in the event of sugars less than 60 mg/dl or greater than 400 mg/dl encouraged to notify the  clinic. Advised on the need for annual eye exams, annual foot exams, Pneumonia vaccine. - atorvastatin (LIPITOR) 10 MG tablet; Take 1 tablet (10 mg total) by mouth at bedtime.  Dispense: 90 tablet; Refill: 1 - insulin aspart (NOVOLOG FLEXPEN) 100 UNIT/ML FlexPen; Inject 0-12 Units into the skin 2 (two) times daily. per sliding scale  Dispense: 30 mL; Refill: 3 - insulin detemir (LEVEMIR FLEXTOUCH) 100 UNIT/ML FlexPen; INJECT 45 UNITS INTO THE SKIN 2 TIMES DAILY.  Dispense: 90 mL; Refill: 1 - linagliptin (TRADJENTA) 5 MG TABS tablet; TAKE 1 TABLET (5 MG TOTAL) BY MOUTH DAILY.  Dispense: 90 tablet; Refill: 1 - Insulin Pen Needle (B-D UF III MINI PEN NEEDLES) 31G X 5 MM MISC; USE FOUR TIMES DAILY  Dispense: 120 each; Refill: 12  2. Hypertension associated with diabetes [facility]) He has  been off his antihypertensive due to previous hypotension. He does have slight diastolic elevation Discontinue lisinopril/HCTZ 10/12.5 and initiate lisinopril 5 mg He is to keep a log of his ambulatory blood pressure readings - lisinopril (ZESTRIL) 5 MG tablet; Take 1 tablet (5 mg total) by mouth daily.  Dispense: 90 tablet; Refill: 1  3. Degenerative disc disease, lumbar Uncontrolled Status post lumbar spinal block - gabapentin (NEURONTIN) 300 MG capsule; Take 1 capsule (300 mg total) by mouth 2 (two) times daily.  Dispense: 180 capsule; Refill: 1 - tiZANidine (ZANAFLEX) 4 MG tablet; Take 1 tablet (4 mg total) by mouth every 8 (eight) hours as needed for muscle spasms.  Dispense: 90 tablet; Refill: 3 - traMADol (ULTRAM) 50 MG tablet; Take 1 tablet (50 mg total) by mouth at bedtime as needed (pain).  Dispense: 30 tablet; Refill: 2  4. Hypertension in stage 3 chronic kidney disease due to type 2 diabetes mellitus ([place]) Combination of hypertensive and diabetic nephropathy Currently followed by [facility] Avoid nephrotoxins  5. Schizoaffective disorder, bipolar type ([place]) Advised to call his  psychiatrist at [facility] and inquire about early refill  6. Recurrent falls Due to gait instability He is high risk for falls I have completed PCS form for additional hours  7. Testosterone deficiency - Testosterone,Free and Total - Testosterone (ANDROGEL) 20.25 MG/1.25GM (1.62%) GEL; Apply 20.25 mg topically every morning. Apply topically under arm pit alternating with each application.  Dispense: 1.25 g; Refill: 3  8. Screening for prostate cancer Due to testosterone replacement therapy will need to check PSA - PSA, total and free  9. Memory loss Early dementia previously worked up by neurology thought to be psych related This is affecting administration of his medications Hopefully he will be approved for additional hours His sister currently assist with filling out his pillbox Consider Namenda or Aricept at his next visit.   His abdominal exam is benign.  Advised to guard against constipation as this could cause left lower quadrant abdominal pain as he complains of.  Pain is absent at this time. Meds ordered this encounter  Medications  . tiZANidine (ZANAFLEX) 4 MG tablet    Sig: Take 1 tablet (4 mg total) by mouth every 8 (eight) hours as needed for muscle spasms.    Dispense:  90 tablet    Refill:  3  . atorvastatin (LIPITOR) 10 MG tablet    Sig: Take 1 tablet (10 mg total) by mouth at bedtime.    Dispense:  90 tablet    Refill:  1  . gabapentin (NEURONTIN) 300 MG capsule    Sig: Take 1 capsule (300 mg total) by mouth 2 (two) times daily.    Dispense:  180 capsule    Refill:  1  . insulin aspart (NOVOLOG FLEXPEN) 100 UNIT/ML FlexPen    Sig: Inject 0-12 Units into the skin 2 (two) times daily. per sliding scale    Dispense:  30 mL    Refill:  3    Blood sugars 0-150 give 0 units of insulin, 151-200 give 2 units, 201-250 give 4 units, 251-300 give 6 units, 301-350 give 8 units, 351-400 give 10 units,> 400 give 12 units and call M.D. Discontinue vial  . insulin detemir  (LEVEMIR FLEXTOUCH) 100 UNIT/ML FlexPen    Sig: INJECT 45 UNITS INTO THE SKIN 2 TIMES DAILY.    Dispense:  90 mL    Refill:  1  . linagliptin (TRADJENTA) 5 MG TABS tablet    Sig: TAKE  1 TABLET (5 MG TOTAL) BY MOUTH DAILY.    Dispense:  90 tablet    Refill:  1  . traZODone (DESYREL) 150 MG tablet    Sig: Take 1 tablet (150 mg total) by mouth at bedtime.    Dispense:  90 tablet    Refill:  1  . DISCONTD: traMADol (ULTRAM) 50 MG tablet    Sig: Take 1 tablet (50 mg total) by mouth at bedtime as needed (pain).    Dispense:  30 tablet    Refill:  2    Dx; Degenerative Disc Disease  . lisinopril (ZESTRIL) 5 MG tablet    Sig: Take 1 tablet (5 mg total) by mouth daily.    Dispense:  90 tablet    Refill:  1  . Insulin Pen Needle (B-D UF III MINI PEN NEEDLES) 31G X 5 MM MISC    Sig: USE FOUR TIMES DAILY    Dispense:  120 each    Refill:  12  . traMADol (ULTRAM) 50 MG tablet    Sig: Take 1 tablet (50 mg total) by mouth at bedtime as needed (pain).    Dispense:  30 tablet    Refill:  2    Dx; Degenerative Disc Disease  . Testosterone (ANDROGEL) 20.25 MG/1.25GM (1.62%) GEL    Sig: Apply 20.25 mg topically every morning. Apply topically under arm pit alternating with each application.    Dispense:  1.25 g    Refill:  3    Dispense Pump    Follow-up: Return in about 3 months (around 05/31/2020) for Chronic disease management.       [name], MD, FAAFP. [facility], [place]   02/29/2020, 5:10 PM

## 2020-02-29 NOTE — Progress Notes (Signed)
Requesting more hours for [facility] service.  Wants to discuss medications.  Needs refills.

## 2020-03-01 ENCOUNTER — Telehealth: Payer: Self-pay | Admitting: Licensed Clinical Social Worker

## 2020-03-01 LAB — TESTOSTERONE,FREE AND TOTAL
Testosterone, Free: 14.6 pg/mL (ref 7.2–24.0)
Testosterone: 354 ng/dL (ref 264–916)

## 2020-03-01 LAB — PSA, TOTAL AND FREE
PSA, Free Pct: 28.1 %
PSA, Free: 0.73 ng/mL
Prostate Specific Ag, Serum: 2.6 ng/mL (ref 0.0–4.0)

## 2020-03-01 NOTE — Telephone Encounter (Signed)
Call placed to patient regarding [facility] referral. Pt shared that he is doing well. Main concern at this time is he has ran out of psych medications and PCP was unable to complete re-fill.   Pt reports that he has an upcoming tele-vist psychiatry appointment with Beverly Sessions, November 16, 21. He reports having a great rapport with current therapist, who he meets with bi-weekly, next appointment with her is scheduled for December 09, 21  Pt would like to inform PCP that he is experiencing ongoing dizziness and an increase in falls. Requests an increase in hours with [facility]) LCSW strongly encouraged patient to contact [facility] to request for a new assessment.   No additional concerns noted.

## 2020-03-02 ENCOUNTER — Encounter: Payer: Self-pay | Admitting: Family Medicine

## 2020-03-02 NOTE — Telephone Encounter (Signed)
Paperwork for [employer] services completed.

## 2020-03-03 DIAGNOSIS — M19019 Primary osteoarthritis, unspecified shoulder: Secondary | ICD-10-CM | POA: Diagnosis not present

## 2020-03-03 DIAGNOSIS — N183 Chronic kidney disease, stage 3 unspecified: Secondary | ICD-10-CM | POA: Diagnosis not present

## 2020-03-03 DIAGNOSIS — M792 Neuralgia and neuritis, unspecified: Secondary | ICD-10-CM | POA: Diagnosis not present

## 2020-03-03 DIAGNOSIS — I129 Hypertensive chronic kidney disease with stage 1 through stage 4 chronic kidney disease, or unspecified chronic kidney disease: Secondary | ICD-10-CM | POA: Diagnosis not present

## 2020-03-03 DIAGNOSIS — E1142 Type 2 diabetes mellitus with diabetic polyneuropathy: Secondary | ICD-10-CM | POA: Diagnosis not present

## 2020-03-03 DIAGNOSIS — M5116 Intervertebral disc disorders with radiculopathy, lumbar region: Secondary | ICD-10-CM | POA: Diagnosis not present

## 2020-03-03 DIAGNOSIS — F0390 Unspecified dementia without behavioral disturbance: Secondary | ICD-10-CM | POA: Diagnosis not present

## 2020-03-03 DIAGNOSIS — E785 Hyperlipidemia, unspecified: Secondary | ICD-10-CM | POA: Diagnosis not present

## 2020-03-03 DIAGNOSIS — E1122 Type 2 diabetes mellitus with diabetic chronic kidney disease: Secondary | ICD-10-CM | POA: Diagnosis not present

## 2020-03-05 ENCOUNTER — Telehealth: Payer: Self-pay | Admitting: Family Medicine

## 2020-03-05 ENCOUNTER — Other Ambulatory Visit: Payer: Self-pay

## 2020-03-05 DIAGNOSIS — E1142 Type 2 diabetes mellitus with diabetic polyneuropathy: Secondary | ICD-10-CM | POA: Diagnosis not present

## 2020-03-05 DIAGNOSIS — E785 Hyperlipidemia, unspecified: Secondary | ICD-10-CM | POA: Diagnosis not present

## 2020-03-05 DIAGNOSIS — M792 Neuralgia and neuritis, unspecified: Secondary | ICD-10-CM | POA: Diagnosis not present

## 2020-03-05 DIAGNOSIS — N183 Chronic kidney disease, stage 3 unspecified: Secondary | ICD-10-CM | POA: Diagnosis not present

## 2020-03-05 DIAGNOSIS — I129 Hypertensive chronic kidney disease with stage 1 through stage 4 chronic kidney disease, or unspecified chronic kidney disease: Secondary | ICD-10-CM | POA: Diagnosis not present

## 2020-03-05 DIAGNOSIS — M5116 Intervertebral disc disorders with radiculopathy, lumbar region: Secondary | ICD-10-CM | POA: Diagnosis not present

## 2020-03-05 DIAGNOSIS — M19019 Primary osteoarthritis, unspecified shoulder: Secondary | ICD-10-CM | POA: Diagnosis not present

## 2020-03-05 DIAGNOSIS — E1122 Type 2 diabetes mellitus with diabetic chronic kidney disease: Secondary | ICD-10-CM | POA: Diagnosis not present

## 2020-03-05 DIAGNOSIS — M5136 Other intervertebral disc degeneration, lumbar region: Secondary | ICD-10-CM

## 2020-03-05 DIAGNOSIS — F0390 Unspecified dementia without behavioral disturbance: Secondary | ICD-10-CM | POA: Diagnosis not present

## 2020-03-05 MED ORDER — MISC. DEVICES MISC: 0 refills | Status: DC

## 2020-03-05 NOTE — Telephone Encounter (Signed)
Script has been printed 

## 2020-03-05 NOTE — Telephone Encounter (Signed)
[name], from [facility], called and is requesting to have a prescription for a Rolator walker for the pt. He is requesting to have this sent to [facility]. Please advise.     [place]

## 2020-03-06 ENCOUNTER — Other Ambulatory Visit: Payer: Self-pay

## 2020-03-06 DIAGNOSIS — M5136 Other intervertebral disc degeneration, lumbar region: Secondary | ICD-10-CM

## 2020-03-06 MED ORDER — MISC. DEVICES MISC: 0 refills | Status: DC

## 2020-03-07 DIAGNOSIS — M19019 Primary osteoarthritis, unspecified shoulder: Secondary | ICD-10-CM | POA: Diagnosis not present

## 2020-03-07 DIAGNOSIS — I129 Hypertensive chronic kidney disease with stage 1 through stage 4 chronic kidney disease, or unspecified chronic kidney disease: Secondary | ICD-10-CM | POA: Diagnosis not present

## 2020-03-07 DIAGNOSIS — N183 Chronic kidney disease, stage 3 unspecified: Secondary | ICD-10-CM | POA: Diagnosis not present

## 2020-03-07 DIAGNOSIS — E1122 Type 2 diabetes mellitus with diabetic chronic kidney disease: Secondary | ICD-10-CM | POA: Diagnosis not present

## 2020-03-07 DIAGNOSIS — F0390 Unspecified dementia without behavioral disturbance: Secondary | ICD-10-CM | POA: Diagnosis not present

## 2020-03-07 DIAGNOSIS — E1142 Type 2 diabetes mellitus with diabetic polyneuropathy: Secondary | ICD-10-CM | POA: Diagnosis not present

## 2020-03-07 DIAGNOSIS — M792 Neuralgia and neuritis, unspecified: Secondary | ICD-10-CM | POA: Diagnosis not present

## 2020-03-07 DIAGNOSIS — M5116 Intervertebral disc disorders with radiculopathy, lumbar region: Secondary | ICD-10-CM | POA: Diagnosis not present

## 2020-03-07 DIAGNOSIS — E785 Hyperlipidemia, unspecified: Secondary | ICD-10-CM | POA: Diagnosis not present

## 2020-03-08 DIAGNOSIS — F250 Schizoaffective disorder, bipolar type: Secondary | ICD-10-CM | POA: Diagnosis not present

## 2020-03-09 DIAGNOSIS — E785 Hyperlipidemia, unspecified: Secondary | ICD-10-CM | POA: Diagnosis not present

## 2020-03-09 DIAGNOSIS — M792 Neuralgia and neuritis, unspecified: Secondary | ICD-10-CM | POA: Diagnosis not present

## 2020-03-09 DIAGNOSIS — M5116 Intervertebral disc disorders with radiculopathy, lumbar region: Secondary | ICD-10-CM | POA: Diagnosis not present

## 2020-03-09 DIAGNOSIS — E1122 Type 2 diabetes mellitus with diabetic chronic kidney disease: Secondary | ICD-10-CM | POA: Diagnosis not present

## 2020-03-09 DIAGNOSIS — E1142 Type 2 diabetes mellitus with diabetic polyneuropathy: Secondary | ICD-10-CM | POA: Diagnosis not present

## 2020-03-09 DIAGNOSIS — I129 Hypertensive chronic kidney disease with stage 1 through stage 4 chronic kidney disease, or unspecified chronic kidney disease: Secondary | ICD-10-CM | POA: Diagnosis not present

## 2020-03-09 DIAGNOSIS — N183 Chronic kidney disease, stage 3 unspecified: Secondary | ICD-10-CM | POA: Diagnosis not present

## 2020-03-09 DIAGNOSIS — F0390 Unspecified dementia without behavioral disturbance: Secondary | ICD-10-CM | POA: Diagnosis not present

## 2020-03-09 DIAGNOSIS — M19019 Primary osteoarthritis, unspecified shoulder: Secondary | ICD-10-CM | POA: Diagnosis not present

## 2020-03-12 DIAGNOSIS — N183 Chronic kidney disease, stage 3 unspecified: Secondary | ICD-10-CM | POA: Diagnosis not present

## 2020-03-12 DIAGNOSIS — F0390 Unspecified dementia without behavioral disturbance: Secondary | ICD-10-CM | POA: Diagnosis not present

## 2020-03-12 DIAGNOSIS — I129 Hypertensive chronic kidney disease with stage 1 through stage 4 chronic kidney disease, or unspecified chronic kidney disease: Secondary | ICD-10-CM | POA: Diagnosis not present

## 2020-03-12 DIAGNOSIS — M5116 Intervertebral disc disorders with radiculopathy, lumbar region: Secondary | ICD-10-CM | POA: Diagnosis not present

## 2020-03-12 DIAGNOSIS — M19019 Primary osteoarthritis, unspecified shoulder: Secondary | ICD-10-CM | POA: Diagnosis not present

## 2020-03-12 DIAGNOSIS — E1142 Type 2 diabetes mellitus with diabetic polyneuropathy: Secondary | ICD-10-CM | POA: Diagnosis not present

## 2020-03-12 DIAGNOSIS — E785 Hyperlipidemia, unspecified: Secondary | ICD-10-CM | POA: Diagnosis not present

## 2020-03-12 DIAGNOSIS — M792 Neuralgia and neuritis, unspecified: Secondary | ICD-10-CM | POA: Diagnosis not present

## 2020-03-12 DIAGNOSIS — E1122 Type 2 diabetes mellitus with diabetic chronic kidney disease: Secondary | ICD-10-CM | POA: Diagnosis not present

## 2020-03-13 DIAGNOSIS — M5136 Other intervertebral disc degeneration, lumbar region: Secondary | ICD-10-CM | POA: Diagnosis not present

## 2020-03-14 ENCOUNTER — Encounter: Payer: Self-pay | Admitting: Gastroenterology

## 2020-03-14 DIAGNOSIS — N183 Chronic kidney disease, stage 3 unspecified: Secondary | ICD-10-CM | POA: Diagnosis not present

## 2020-03-14 DIAGNOSIS — F0390 Unspecified dementia without behavioral disturbance: Secondary | ICD-10-CM | POA: Diagnosis not present

## 2020-03-14 DIAGNOSIS — E785 Hyperlipidemia, unspecified: Secondary | ICD-10-CM | POA: Diagnosis not present

## 2020-03-14 DIAGNOSIS — E1122 Type 2 diabetes mellitus with diabetic chronic kidney disease: Secondary | ICD-10-CM | POA: Diagnosis not present

## 2020-03-14 DIAGNOSIS — M5116 Intervertebral disc disorders with radiculopathy, lumbar region: Secondary | ICD-10-CM | POA: Diagnosis not present

## 2020-03-14 DIAGNOSIS — E1142 Type 2 diabetes mellitus with diabetic polyneuropathy: Secondary | ICD-10-CM | POA: Diagnosis not present

## 2020-03-14 DIAGNOSIS — M19019 Primary osteoarthritis, unspecified shoulder: Secondary | ICD-10-CM | POA: Diagnosis not present

## 2020-03-14 DIAGNOSIS — I129 Hypertensive chronic kidney disease with stage 1 through stage 4 chronic kidney disease, or unspecified chronic kidney disease: Secondary | ICD-10-CM | POA: Diagnosis not present

## 2020-03-14 DIAGNOSIS — M792 Neuralgia and neuritis, unspecified: Secondary | ICD-10-CM | POA: Diagnosis not present

## 2020-03-19 ENCOUNTER — Ambulatory Visit: Payer: Self-pay

## 2020-03-19 NOTE — Telephone Encounter (Signed)
   [name]. Male, 54 y.o., [birth year] MRN:  [id number] Phone:  [phone] Jerilynn Mages) PCP:  [name], MD Coverage:  [facility] 03/21/2020 at 3:00 PM Message from [facility] sent at 03/19/2020 2:37 PM EST  Pt stated his blood sugar level is all over the place. Pt stated when he wakes up it is usually around 69 or 70 but it is up and down and he is not sure if the medication needs to be adjusted. Pt requests call back   Call History   Type Contact Phone User  03/19/2020 02:35 PM EST Phone (Incoming) [name], [name]. (Self) [phone] (M) [name], [name]  Pt. Reports he has noticed x 2 weeks having " more lows with my blood sugars. I'm eating more fruit." Fasting BS 60-70. "I eat breakfast, it comes up and then goes down to 120." Taking medicines as prescribed. Only symptom is some "dizziness at times." Spoke with Danae Chen in the practice and will forward triage note for PCP to review.  Answer Assessment - Initial Assessment Questions 1. SYMPTOMS: "What symptoms are you concerned about?"     Low 2. ONSET:  "When did the symptoms start?"     2 weeks  3. BLOOD GLUCOSE: "What is your blood glucose level?"      60-70 in morning 4. USUAL RANGE: "What is your blood glucose level usually?" (e.g., usual fasting morning value, usual evening value)     80 5. TYPE 1 or 2:  "Do you know what type of diabetes you have?"  (e.g., Type 1, Type 2, Gestational; doesn't know)      Type 2 6. INSULIN: "Do you take insulin?" "What type of insulin(s) do you use? What is the mode of delivery? (syringe, pen; injection or pump) "When did you last give yourself an insulin dose?" (i.e., time or hours/minutes ago) "How much did you give?" (i.e., how many units)     Yes 7. DIABETES PILLS: "Do you take any pills for your diabetes?"     Yes 8. OTHER SYMPTOMS: "Do you have any symptoms?" (e.g., fever, frequent urination, difficulty  breathing, vomiting)     Dizzy 9. LOW BLOOD GLUCOSE TREATMENT: "What have you done so far to treat the low blood glucose level?"     Eating more fruit 10. FOOD: "When did you last eat or drink?"       Today 11. ALONE: "Are you alone right now or is someone with you?"        No 12. PREGNANCY: "Is there any chance you are pregnant?" "When was your last menstrual period?"       n/a  Protocols used: DIABETES - LOW BLOOD SUGAR-A-AH

## 2020-03-20 NOTE — Telephone Encounter (Signed)
Pt blood sugars has been going up and down.  I will set pt up with telephone visit to discuss further.

## 2020-03-21 ENCOUNTER — Encounter: Payer: Self-pay | Admitting: Gastroenterology

## 2020-03-21 ENCOUNTER — Other Ambulatory Visit: Payer: Self-pay

## 2020-03-21 ENCOUNTER — Ambulatory Visit (AMBULATORY_SURGERY_CENTER): Payer: Medicare HMO | Admitting: Gastroenterology

## 2020-03-21 VITALS — BP 124/85 | HR 69 | Temp 97.6°F | Resp 14 | Ht 77.0 in | Wt 201.0 lb

## 2020-03-21 DIAGNOSIS — K319 Disease of stomach and duodenum, unspecified: Secondary | ICD-10-CM | POA: Diagnosis not present

## 2020-03-21 DIAGNOSIS — K921 Melena: Secondary | ICD-10-CM

## 2020-03-21 DIAGNOSIS — K2970 Gastritis, unspecified, without bleeding: Secondary | ICD-10-CM

## 2020-03-21 DIAGNOSIS — D123 Benign neoplasm of transverse colon: Secondary | ICD-10-CM

## 2020-03-21 DIAGNOSIS — K2950 Unspecified chronic gastritis without bleeding: Secondary | ICD-10-CM | POA: Diagnosis not present

## 2020-03-21 DIAGNOSIS — D125 Benign neoplasm of sigmoid colon: Secondary | ICD-10-CM

## 2020-03-21 DIAGNOSIS — D124 Benign neoplasm of descending colon: Secondary | ICD-10-CM

## 2020-03-21 DIAGNOSIS — D509 Iron deficiency anemia, unspecified: Secondary | ICD-10-CM

## 2020-03-21 DIAGNOSIS — D122 Benign neoplasm of ascending colon: Secondary | ICD-10-CM | POA: Diagnosis not present

## 2020-03-21 DIAGNOSIS — K640 First degree hemorrhoids: Secondary | ICD-10-CM

## 2020-03-21 HISTORY — PX: COLONOSCOPY: SHX174

## 2020-03-21 MED ORDER — SODIUM CHLORIDE 0.9 % IV SOLN: 500.0000 mL | INTRAVENOUS | Status: DC

## 2020-03-21 NOTE — Progress Notes (Signed)
Called to room to assist during endoscopic procedure.  Patient ID and intended procedure confirmed with present staff. Received instructions for my participation in the procedure from the performing physician.  

## 2020-03-21 NOTE — Progress Notes (Signed)
PT taken to PACU. Monitors in place. VSS. Report given to RN. 

## 2020-03-21 NOTE — Patient Instructions (Signed)
YOU HAD AN ENDOSCOPIC PROCEDURE TODAY AT THE [facility]:   Refer to the procedure report that was given to you for any specific questions about what was found during the examination.  If the procedure report does not answer your questions, please call your gastroenterologist to clarify.  If you requested that your care partner not be given the details of your procedure findings, then the procedure report has been included in a sealed envelope for you to review at your convenience later.  YOU SHOULD EXPECT: Some feelings of bloating in the abdomen. Passage of more gas than usual.  Walking can help get rid of the air that was put into your GI tract during the procedure and reduce the bloating. If you had a lower endoscopy (such as a colonoscopy or flexible sigmoidoscopy) you may notice spotting of blood in your stool or on the toilet paper. If you underwent a bowel prep for your procedure, you may not have a normal bowel movement for a few days.  Please Note:  You might notice some irritation and congestion in your nose or some drainage.  This is from the oxygen used during your procedure.  There is no need for concern and it should clear up in a day or so.  SYMPTOMS TO REPORT IMMEDIATELY:   Following lower endoscopy (colonoscopy or flexible sigmoidoscopy):  Excessive amounts of blood in the stool  Significant tenderness or worsening of abdominal pains  Swelling of the abdomen that is new, acute  Fever of 100F or higher   Following upper endoscopy (EGD)  Vomiting of blood or coffee ground material  New chest pain or pain under the shoulder blades  Painful or persistently difficult swallowing  New shortness of breath  Fever of 100F or higher  Black, tarry-looking stools  For urgent or emergent issues, a gastroenterologist can be reached at any hour by calling [phone]. Do not use MyChart messaging for urgent concerns.    DIET:  We do recommend a small meal at first, but  then you may proceed to your regular diet.  Drink plenty of fluids but you should avoid alcoholic beverages for 24 hours.  ACTIVITY:  You should plan to take it easy for the rest of today and you should NOT DRIVE or use heavy machinery until tomorrow (because of the sedation medicines used during the test).    FOLLOW UP: Our staff will call the number listed on your records 48-72 hours following your procedure to check on you and address any questions or concerns that you may have regarding the information given to you following your procedure. If we do not reach you, we will leave a message.  We will attempt to reach you two times.  During this call, we will ask if you have developed any symptoms of COVID 19. If you develop any symptoms (ie: fever, flu-like symptoms, shortness of breath, cough etc.) before then, please call [phone].  If you test positive for Covid 19 in the 2 weeks post procedure, please call and report this information to us.    If any biopsies were taken you will be contacted by phone or by letter within the next 1-3 weeks.  Please call us at [phone] if you have not heard about the biopsies in 3 weeks.    SIGNATURES/CONFIDENTIALITY: You and/or your care partner have signed paperwork which will be entered into your electronic medical record.  These signatures attest to the fact that that the information above on   your After Visit Summary has been reviewed and is understood.  Full responsibility of the confidentiality of this discharge information lies with you and/or your care-partner. 

## 2020-03-21 NOTE — Op Note (Addendum)
[place] Patient Name: [name] Procedure Date: 03/21/2020 4:15 PM MRN: [id number] Endoscopist: Ladene Artist , MD Age: 54 Referring MD:  Date of Birth: 02/26/1966 Gender: Male Account #: [id number] Procedure:                Colonoscopy Indications:              Hematochezia, Unexplained iron deficiency anemia Medicines:                Monitored Anesthesia Care Procedure:                Pre-Anesthesia Assessment:                           - Prior to the procedure, a History and Physical                            was performed, and patient medications and                            allergies were reviewed. The patient's tolerance of                            previous anesthesia was also reviewed. The risks                            and benefits of the procedure and the sedation                            options and risks were discussed with the patient.                            All questions were answered, and informed consent                            was obtained. Prior Anticoagulants: The patient has                            taken no previous anticoagulant or antiplatelet                            agents. ASA Grade Assessment: III - A patient with                            severe systemic disease. After reviewing the risks                            and benefits, the patient was deemed in                            satisfactory condition to undergo the procedure.                           After obtaining informed consent, the colonoscope  was passed under direct vision. Throughout the                            procedure, the patient's blood pressure, pulse, and                            oxygen saturations were monitored continuously. The                            Colonoscope was introduced through the anus and                            advanced to the the cecum, identified by                            appendiceal orifice  and ileocecal valve. The                            ileocecal valve, appendiceal orifice, and rectum                            were photographed. The quality of the bowel                            preparation was adequate after extensive lavage and                            suction. The colonoscopy was performed without                            difficulty. The patient tolerated the procedure                            well. Scope In: 4:22:04 PM Scope Out: 4:49:27 PM Scope Withdrawal Time: 0 hours 22 minutes 48 seconds  Total Procedure Duration: 0 hours 27 minutes 23 seconds  Findings:                 The perianal and digital rectal examinations were                            normal.                           A 12 mm polyp was found in the ascending colon. The                            polyp was sessile. The polyp was removed with a hot                            snare. Resection and retrieval were complete.                           Three sessile polyps were found in the sigmoid  colon, descending colon and transverse colon. The                            polyps were 6 to 7 mm in size. These polyps were                            removed with a cold snare. Resection and retrieval                            were complete.                           Internal hemorrhoids were found during                            retroflexion. The hemorrhoids were small and Grade                            I (internal hemorrhoids that do not prolapse).                           The exam was otherwise without abnormality on                            direct and retroflexion views. Complications:            No immediate complications. Estimated blood loss:                            None. Estimated Blood Loss:     Estimated blood loss: none. Impression:               - One 12 mm polyp in the ascending colon, removed                            with a hot snare. Resected and  retrieved.                           - Three 6 to 7 mm polyps in the sigmoid colon, in                            the descending colon and in the transverse colon,                            removed with a cold snare. Resected and retrieved.                           - Internal hemorrhoids.                           - The examination was otherwise normal on direct                            and retroflexion views. Recommendation:           -  Repeat colonoscopy after studies are complete for                            surveillance based on pathology results.                           - Patient has a contact number available for                            emergencies. The signs and symptoms of potential                            delayed complications were discussed with the                            patient. Return to normal activities tomorrow.                            Written discharge instructions were provided to the                            patient.                           - High fiber diet.                           - Continue present medications.                           - Await pathology results.                           - No aspirin, ibuprofen, naproxen, or other                            non-steroidal anti-inflammatory drugs for 2 weeks                            after polyp removal.                           - Preparation H supp (OTC) qd prn hemorrhoidal                            symptoms.                           - Return to PCP for further mgmt of iron deficiency                            anemia and ongoing medical care. Ladene Artist, MD 03/21/2020 5:02:57 PM This report has been signed electronically.

## 2020-03-21 NOTE — Op Note (Signed)
[place] Patient Name: [name] Procedure Date: 03/21/2020 4:15 PM MRN: [id number] Endoscopist: Ladene Artist , MD Age: 54 Referring MD:  Date of Birth: 05-24-1965 Gender: Male Account #: [id number] Procedure:                Upper GI endoscopy Indications:              Unexplained iron deficiency anemia Medicines:                Monitored Anesthesia Care Procedure:                Pre-Anesthesia Assessment:                           - Prior to the procedure, a History and Physical                            was performed, and patient medications and                            allergies were reviewed. The patient's tolerance of                            previous anesthesia was also reviewed. The risks                            and benefits of the procedure and the sedation                            options and risks were discussed with the patient.                            All questions were answered, and informed consent                            was obtained. Prior Anticoagulants: The patient has                            taken no previous anticoagulant or antiplatelet                            agents. ASA Grade Assessment: III - A patient with                            severe systemic disease. After reviewing the risks                            and benefits, the patient was deemed in                            satisfactory condition to undergo the procedure.                           After obtaining informed consent, the endoscope was  passed under direct vision. Throughout the                            procedure, the patient's blood pressure, pulse, and                            oxygen saturations were monitored continuously. The                            Endoscope was introduced through the mouth, and                            advanced to the second part of duodenum. The upper                            GI endoscopy was  accomplished without difficulty.                            The patient tolerated the procedure well. Scope In: Scope Out: Findings:                 The examined esophagus was normal.                           Patchy mild inflammation characterized by erythema                            and granularity was found in the gastric body, in                            the gastric antrum and in the prepyloric region of                            the stomach. Biopsies were taken with a cold                            forceps for histology.                           The exam of the stomach was otherwise normal.                           Patchy mildly erythematous mucosa without active                            bleeding and with no stigmata of bleeding was found                            in the duodenal bulb and in the second portion of                            the duodenum. Biopsies for histology were taken  with a cold forceps for evaluation of celiac                            disease. Complications:            No immediate complications. Estimated Blood Loss:     Estimated blood loss was minimal. Impression:               - Normal esophagus.                           - Gastritis. Biopsied.                           - Erythematous duodenopathy. Biopsied. Recommendation:           - Patient has a contact number available for                            emergencies. The signs and symptoms of potential                            delayed complications were discussed with the                            patient. Return to normal activities tomorrow.                            Written discharge instructions were provided to the                            patient.                           - Resume previous diet.                           - Continue present medications.                           - Await pathology results. Ladene Artist, MD 03/21/2020 5:06:00 PM This  report has been signed electronically.

## 2020-03-22 ENCOUNTER — Telehealth: Payer: Self-pay | Admitting: Family Medicine

## 2020-03-22 DIAGNOSIS — M19019 Primary osteoarthritis, unspecified shoulder: Secondary | ICD-10-CM | POA: Diagnosis not present

## 2020-03-22 DIAGNOSIS — M5116 Intervertebral disc disorders with radiculopathy, lumbar region: Secondary | ICD-10-CM | POA: Diagnosis not present

## 2020-03-22 DIAGNOSIS — F0390 Unspecified dementia without behavioral disturbance: Secondary | ICD-10-CM | POA: Diagnosis not present

## 2020-03-22 DIAGNOSIS — E1142 Type 2 diabetes mellitus with diabetic polyneuropathy: Secondary | ICD-10-CM | POA: Diagnosis not present

## 2020-03-22 DIAGNOSIS — N183 Chronic kidney disease, stage 3 unspecified: Secondary | ICD-10-CM | POA: Diagnosis not present

## 2020-03-22 DIAGNOSIS — I129 Hypertensive chronic kidney disease with stage 1 through stage 4 chronic kidney disease, or unspecified chronic kidney disease: Secondary | ICD-10-CM | POA: Diagnosis not present

## 2020-03-22 DIAGNOSIS — M792 Neuralgia and neuritis, unspecified: Secondary | ICD-10-CM | POA: Diagnosis not present

## 2020-03-22 DIAGNOSIS — E1122 Type 2 diabetes mellitus with diabetic chronic kidney disease: Secondary | ICD-10-CM | POA: Diagnosis not present

## 2020-03-22 DIAGNOSIS — E785 Hyperlipidemia, unspecified: Secondary | ICD-10-CM | POA: Diagnosis not present

## 2020-03-22 NOTE — Telephone Encounter (Signed)
Flor was called and given verbal orders for pt.

## 2020-03-22 NOTE — Telephone Encounter (Signed)
Flor, from [facility] at home, called stating that they are requesting to extend the pts PT. For 2x for 1wk, 1x for 5wks. Please advise.     [phone] secure line

## 2020-03-23 ENCOUNTER — Telehealth: Payer: Self-pay

## 2020-03-23 ENCOUNTER — Telehealth: Payer: Self-pay | Admitting: *Deleted

## 2020-03-23 NOTE — Telephone Encounter (Signed)
  Follow up Call-  Call back number 03/21/2020  Post procedure Call Back phone  # [phone]  Permission to leave phone message Yes  Some recent data might be hidden     Patient questions:  Do you have a fever, pain , or abdominal swelling? No. Pain Score  0 *  Have you tolerated food without any problems? Yes.    Have you been able to return to your normal activities? Yes.    Do you have any questions about your discharge instructions: Diet   No. Medications  No. Follow up visit  No.  Do you have questions or concerns about your Care? No.  Actions: * If pain score is 4 or above: No action needed, pain <4.  1. Have you developed a fever since your procedure? no  2.   Have you had an respiratory symptoms (SOB or cough) since your procedure?no  3.   Have you tested positive for COVID 19 since your procedure no  4.   Have you had any family members/close contacts diagnosed with the COVID 19 since your procedure? no   If yes to any of these questions please route to Joylene John, RN and Joella Prince, RN

## 2020-03-23 NOTE — Telephone Encounter (Signed)
  Follow up Call-  Call back number 03/21/2020  Post procedure Call Back phone  # [phone]  Permission to leave phone message Yes  Some recent data might be hidden     1st follow up call made.  NALM

## 2020-03-25 DIAGNOSIS — G8929 Other chronic pain: Secondary | ICD-10-CM | POA: Diagnosis not present

## 2020-03-25 DIAGNOSIS — F0390 Unspecified dementia without behavioral disturbance: Secondary | ICD-10-CM | POA: Diagnosis not present

## 2020-03-25 DIAGNOSIS — M6281 Muscle weakness (generalized): Secondary | ICD-10-CM | POA: Diagnosis not present

## 2020-03-25 DIAGNOSIS — G9349 Other encephalopathy: Secondary | ICD-10-CM | POA: Diagnosis not present

## 2020-03-26 DIAGNOSIS — N183 Chronic kidney disease, stage 3 unspecified: Secondary | ICD-10-CM | POA: Diagnosis not present

## 2020-03-26 DIAGNOSIS — E1122 Type 2 diabetes mellitus with diabetic chronic kidney disease: Secondary | ICD-10-CM | POA: Diagnosis not present

## 2020-03-26 DIAGNOSIS — E1142 Type 2 diabetes mellitus with diabetic polyneuropathy: Secondary | ICD-10-CM | POA: Diagnosis not present

## 2020-03-26 DIAGNOSIS — M5116 Intervertebral disc disorders with radiculopathy, lumbar region: Secondary | ICD-10-CM | POA: Diagnosis not present

## 2020-03-26 DIAGNOSIS — E785 Hyperlipidemia, unspecified: Secondary | ICD-10-CM | POA: Diagnosis not present

## 2020-03-26 DIAGNOSIS — I129 Hypertensive chronic kidney disease with stage 1 through stage 4 chronic kidney disease, or unspecified chronic kidney disease: Secondary | ICD-10-CM | POA: Diagnosis not present

## 2020-03-26 DIAGNOSIS — F0390 Unspecified dementia without behavioral disturbance: Secondary | ICD-10-CM | POA: Diagnosis not present

## 2020-03-26 DIAGNOSIS — M792 Neuralgia and neuritis, unspecified: Secondary | ICD-10-CM | POA: Diagnosis not present

## 2020-03-26 DIAGNOSIS — M19019 Primary osteoarthritis, unspecified shoulder: Secondary | ICD-10-CM | POA: Diagnosis not present

## 2020-03-28 ENCOUNTER — Other Ambulatory Visit: Payer: Self-pay | Admitting: Family Medicine

## 2020-03-28 ENCOUNTER — Other Ambulatory Visit: Payer: Self-pay | Admitting: Physician Assistant

## 2020-03-28 DIAGNOSIS — N1831 Chronic kidney disease, stage 3a: Secondary | ICD-10-CM

## 2020-03-28 DIAGNOSIS — K219 Gastro-esophageal reflux disease without esophagitis: Secondary | ICD-10-CM

## 2020-03-29 DIAGNOSIS — M5116 Intervertebral disc disorders with radiculopathy, lumbar region: Secondary | ICD-10-CM | POA: Diagnosis not present

## 2020-03-29 DIAGNOSIS — F250 Schizoaffective disorder, bipolar type: Secondary | ICD-10-CM | POA: Diagnosis not present

## 2020-03-29 DIAGNOSIS — M792 Neuralgia and neuritis, unspecified: Secondary | ICD-10-CM | POA: Diagnosis not present

## 2020-03-29 DIAGNOSIS — E1142 Type 2 diabetes mellitus with diabetic polyneuropathy: Secondary | ICD-10-CM | POA: Diagnosis not present

## 2020-03-29 DIAGNOSIS — E785 Hyperlipidemia, unspecified: Secondary | ICD-10-CM | POA: Diagnosis not present

## 2020-03-29 DIAGNOSIS — I129 Hypertensive chronic kidney disease with stage 1 through stage 4 chronic kidney disease, or unspecified chronic kidney disease: Secondary | ICD-10-CM | POA: Diagnosis not present

## 2020-03-29 DIAGNOSIS — N183 Chronic kidney disease, stage 3 unspecified: Secondary | ICD-10-CM | POA: Diagnosis not present

## 2020-03-29 DIAGNOSIS — E1122 Type 2 diabetes mellitus with diabetic chronic kidney disease: Secondary | ICD-10-CM | POA: Diagnosis not present

## 2020-03-29 DIAGNOSIS — M19019 Primary osteoarthritis, unspecified shoulder: Secondary | ICD-10-CM | POA: Diagnosis not present

## 2020-03-29 DIAGNOSIS — F0390 Unspecified dementia without behavioral disturbance: Secondary | ICD-10-CM | POA: Diagnosis not present

## 2020-03-30 DIAGNOSIS — M792 Neuralgia and neuritis, unspecified: Secondary | ICD-10-CM | POA: Diagnosis not present

## 2020-03-30 DIAGNOSIS — E1122 Type 2 diabetes mellitus with diabetic chronic kidney disease: Secondary | ICD-10-CM | POA: Diagnosis not present

## 2020-03-30 DIAGNOSIS — I129 Hypertensive chronic kidney disease with stage 1 through stage 4 chronic kidney disease, or unspecified chronic kidney disease: Secondary | ICD-10-CM | POA: Diagnosis not present

## 2020-03-30 DIAGNOSIS — F0390 Unspecified dementia without behavioral disturbance: Secondary | ICD-10-CM | POA: Diagnosis not present

## 2020-03-30 DIAGNOSIS — E1142 Type 2 diabetes mellitus with diabetic polyneuropathy: Secondary | ICD-10-CM | POA: Diagnosis not present

## 2020-03-30 DIAGNOSIS — M5116 Intervertebral disc disorders with radiculopathy, lumbar region: Secondary | ICD-10-CM | POA: Diagnosis not present

## 2020-03-30 DIAGNOSIS — M19019 Primary osteoarthritis, unspecified shoulder: Secondary | ICD-10-CM | POA: Diagnosis not present

## 2020-03-30 DIAGNOSIS — E785 Hyperlipidemia, unspecified: Secondary | ICD-10-CM | POA: Diagnosis not present

## 2020-03-30 DIAGNOSIS — N183 Chronic kidney disease, stage 3 unspecified: Secondary | ICD-10-CM | POA: Diagnosis not present

## 2020-04-02 DIAGNOSIS — E785 Hyperlipidemia, unspecified: Secondary | ICD-10-CM | POA: Diagnosis not present

## 2020-04-02 DIAGNOSIS — E1122 Type 2 diabetes mellitus with diabetic chronic kidney disease: Secondary | ICD-10-CM | POA: Diagnosis not present

## 2020-04-02 DIAGNOSIS — N183 Chronic kidney disease, stage 3 unspecified: Secondary | ICD-10-CM | POA: Diagnosis not present

## 2020-04-02 DIAGNOSIS — F0390 Unspecified dementia without behavioral disturbance: Secondary | ICD-10-CM | POA: Diagnosis not present

## 2020-04-02 DIAGNOSIS — I129 Hypertensive chronic kidney disease with stage 1 through stage 4 chronic kidney disease, or unspecified chronic kidney disease: Secondary | ICD-10-CM | POA: Diagnosis not present

## 2020-04-02 DIAGNOSIS — E1142 Type 2 diabetes mellitus with diabetic polyneuropathy: Secondary | ICD-10-CM | POA: Diagnosis not present

## 2020-04-02 DIAGNOSIS — M19019 Primary osteoarthritis, unspecified shoulder: Secondary | ICD-10-CM | POA: Diagnosis not present

## 2020-04-02 DIAGNOSIS — M5116 Intervertebral disc disorders with radiculopathy, lumbar region: Secondary | ICD-10-CM | POA: Diagnosis not present

## 2020-04-02 DIAGNOSIS — M792 Neuralgia and neuritis, unspecified: Secondary | ICD-10-CM | POA: Diagnosis not present

## 2020-04-03 DIAGNOSIS — E1122 Type 2 diabetes mellitus with diabetic chronic kidney disease: Secondary | ICD-10-CM | POA: Diagnosis not present

## 2020-04-03 DIAGNOSIS — F0390 Unspecified dementia without behavioral disturbance: Secondary | ICD-10-CM | POA: Diagnosis not present

## 2020-04-03 DIAGNOSIS — M5116 Intervertebral disc disorders with radiculopathy, lumbar region: Secondary | ICD-10-CM | POA: Diagnosis not present

## 2020-04-03 DIAGNOSIS — M19019 Primary osteoarthritis, unspecified shoulder: Secondary | ICD-10-CM | POA: Diagnosis not present

## 2020-04-03 DIAGNOSIS — E1142 Type 2 diabetes mellitus with diabetic polyneuropathy: Secondary | ICD-10-CM | POA: Diagnosis not present

## 2020-04-03 DIAGNOSIS — I129 Hypertensive chronic kidney disease with stage 1 through stage 4 chronic kidney disease, or unspecified chronic kidney disease: Secondary | ICD-10-CM | POA: Diagnosis not present

## 2020-04-03 DIAGNOSIS — E785 Hyperlipidemia, unspecified: Secondary | ICD-10-CM | POA: Diagnosis not present

## 2020-04-03 DIAGNOSIS — N183 Chronic kidney disease, stage 3 unspecified: Secondary | ICD-10-CM | POA: Diagnosis not present

## 2020-04-03 DIAGNOSIS — M792 Neuralgia and neuritis, unspecified: Secondary | ICD-10-CM | POA: Diagnosis not present

## 2020-04-09 ENCOUNTER — Encounter: Payer: Self-pay | Admitting: Gastroenterology

## 2020-04-10 ENCOUNTER — Telehealth: Payer: Self-pay | Admitting: Family Medicine

## 2020-04-10 NOTE — Telephone Encounter (Signed)
Medication was prescribed by Dr.Newlin back in 2020.

## 2020-04-10 NOTE — Telephone Encounter (Signed)
Patient requesting Chantix and it's not on current medication list. Review for refill

## 2020-04-10 NOTE — Telephone Encounter (Signed)
Medication is currently unavailable d/t recall.

## 2020-04-10 NOTE — Telephone Encounter (Signed)
Chantix is currently on recall can pt have something to help to stop smoking

## 2020-04-11 DIAGNOSIS — F0390 Unspecified dementia without behavioral disturbance: Secondary | ICD-10-CM | POA: Diagnosis not present

## 2020-04-11 DIAGNOSIS — E1142 Type 2 diabetes mellitus with diabetic polyneuropathy: Secondary | ICD-10-CM | POA: Diagnosis not present

## 2020-04-11 DIAGNOSIS — M19019 Primary osteoarthritis, unspecified shoulder: Secondary | ICD-10-CM | POA: Diagnosis not present

## 2020-04-11 DIAGNOSIS — E785 Hyperlipidemia, unspecified: Secondary | ICD-10-CM | POA: Diagnosis not present

## 2020-04-11 DIAGNOSIS — M5116 Intervertebral disc disorders with radiculopathy, lumbar region: Secondary | ICD-10-CM | POA: Diagnosis not present

## 2020-04-11 DIAGNOSIS — I129 Hypertensive chronic kidney disease with stage 1 through stage 4 chronic kidney disease, or unspecified chronic kidney disease: Secondary | ICD-10-CM | POA: Diagnosis not present

## 2020-04-11 DIAGNOSIS — M792 Neuralgia and neuritis, unspecified: Secondary | ICD-10-CM | POA: Diagnosis not present

## 2020-04-11 DIAGNOSIS — E1122 Type 2 diabetes mellitus with diabetic chronic kidney disease: Secondary | ICD-10-CM | POA: Diagnosis not present

## 2020-04-11 DIAGNOSIS — N183 Chronic kidney disease, stage 3 unspecified: Secondary | ICD-10-CM | POA: Diagnosis not present

## 2020-04-11 NOTE — Telephone Encounter (Signed)
The alternative is Wellbutrin which he already is on. He can also use nicotine patches

## 2020-04-12 ENCOUNTER — Encounter: Payer: Self-pay | Admitting: Family Medicine

## 2020-04-12 ENCOUNTER — Other Ambulatory Visit: Payer: Self-pay | Admitting: Family Medicine

## 2020-04-12 DIAGNOSIS — N401 Enlarged prostate with lower urinary tract symptoms: Secondary | ICD-10-CM

## 2020-04-12 MED ORDER — TADALAFIL 5 MG PO TABS: ORAL_TABLET | ORAL | 0 refills | Status: DC

## 2020-04-12 MED ORDER — NICOTINE 14 MG/24HR TD PT24: 14.0000 mg | MEDICATED_PATCH | Freq: Every day | TRANSDERMAL | 1 refills | Status: DC

## 2020-04-12 NOTE — Addendum Note (Signed)
Addended by: [name] on: 04/12/2020 08:44 PM   Modules accepted: Orders

## 2020-04-12 NOTE — Telephone Encounter (Signed)
Pt states that he would like to use the patches.

## 2020-04-12 NOTE — Telephone Encounter (Signed)
Prescription sent to Pharmacy.  

## 2020-04-16 DIAGNOSIS — M5116 Intervertebral disc disorders with radiculopathy, lumbar region: Secondary | ICD-10-CM | POA: Diagnosis not present

## 2020-04-16 DIAGNOSIS — E1122 Type 2 diabetes mellitus with diabetic chronic kidney disease: Secondary | ICD-10-CM | POA: Diagnosis not present

## 2020-04-16 DIAGNOSIS — F0390 Unspecified dementia without behavioral disturbance: Secondary | ICD-10-CM | POA: Diagnosis not present

## 2020-04-16 DIAGNOSIS — M19019 Primary osteoarthritis, unspecified shoulder: Secondary | ICD-10-CM | POA: Diagnosis not present

## 2020-04-16 DIAGNOSIS — M792 Neuralgia and neuritis, unspecified: Secondary | ICD-10-CM | POA: Diagnosis not present

## 2020-04-16 DIAGNOSIS — E1142 Type 2 diabetes mellitus with diabetic polyneuropathy: Secondary | ICD-10-CM | POA: Diagnosis not present

## 2020-04-16 DIAGNOSIS — N183 Chronic kidney disease, stage 3 unspecified: Secondary | ICD-10-CM | POA: Diagnosis not present

## 2020-04-16 DIAGNOSIS — E785 Hyperlipidemia, unspecified: Secondary | ICD-10-CM | POA: Diagnosis not present

## 2020-04-16 DIAGNOSIS — I129 Hypertensive chronic kidney disease with stage 1 through stage 4 chronic kidney disease, or unspecified chronic kidney disease: Secondary | ICD-10-CM | POA: Diagnosis not present

## 2020-04-19 DIAGNOSIS — E1165 Type 2 diabetes mellitus with hyperglycemia: Secondary | ICD-10-CM | POA: Diagnosis not present

## 2020-04-20 ENCOUNTER — Encounter: Payer: Self-pay | Admitting: Family Medicine

## 2020-04-22 ENCOUNTER — Other Ambulatory Visit: Payer: Self-pay | Admitting: Family Medicine

## 2020-04-22 DIAGNOSIS — N401 Enlarged prostate with lower urinary tract symptoms: Secondary | ICD-10-CM

## 2020-04-22 MED ORDER — TADALAFIL 5 MG PO TABS: ORAL_TABLET | ORAL | 0 refills | Status: DC

## 2020-04-25 DIAGNOSIS — E1142 Type 2 diabetes mellitus with diabetic polyneuropathy: Secondary | ICD-10-CM | POA: Diagnosis not present

## 2020-04-25 DIAGNOSIS — Z794 Long term (current) use of insulin: Secondary | ICD-10-CM | POA: Diagnosis not present

## 2020-04-25 DIAGNOSIS — M19019 Primary osteoarthritis, unspecified shoulder: Secondary | ICD-10-CM | POA: Diagnosis not present

## 2020-04-25 DIAGNOSIS — K760 Fatty (change of) liver, not elsewhere classified: Secondary | ICD-10-CM | POA: Diagnosis not present

## 2020-04-25 DIAGNOSIS — F0390 Unspecified dementia without behavioral disturbance: Secondary | ICD-10-CM | POA: Diagnosis not present

## 2020-04-25 DIAGNOSIS — D696 Thrombocytopenia, unspecified: Secondary | ICD-10-CM | POA: Diagnosis not present

## 2020-04-25 DIAGNOSIS — M5116 Intervertebral disc disorders with radiculopathy, lumbar region: Secondary | ICD-10-CM | POA: Diagnosis not present

## 2020-04-25 DIAGNOSIS — E1122 Type 2 diabetes mellitus with diabetic chronic kidney disease: Secondary | ICD-10-CM | POA: Diagnosis not present

## 2020-04-25 DIAGNOSIS — Z9181 History of falling: Secondary | ICD-10-CM | POA: Diagnosis not present

## 2020-04-25 DIAGNOSIS — N200 Calculus of kidney: Secondary | ICD-10-CM | POA: Diagnosis not present

## 2020-04-25 DIAGNOSIS — N183 Chronic kidney disease, stage 3 unspecified: Secondary | ICD-10-CM | POA: Diagnosis not present

## 2020-04-25 DIAGNOSIS — Z87891 Personal history of nicotine dependence: Secondary | ICD-10-CM | POA: Diagnosis not present

## 2020-04-25 DIAGNOSIS — K219 Gastro-esophageal reflux disease without esophagitis: Secondary | ICD-10-CM | POA: Diagnosis not present

## 2020-04-25 DIAGNOSIS — R338 Other retention of urine: Secondary | ICD-10-CM | POA: Diagnosis not present

## 2020-04-25 DIAGNOSIS — M792 Neuralgia and neuritis, unspecified: Secondary | ICD-10-CM | POA: Diagnosis not present

## 2020-04-25 DIAGNOSIS — E785 Hyperlipidemia, unspecified: Secondary | ICD-10-CM | POA: Diagnosis not present

## 2020-04-25 DIAGNOSIS — I129 Hypertensive chronic kidney disease with stage 1 through stage 4 chronic kidney disease, or unspecified chronic kidney disease: Secondary | ICD-10-CM | POA: Diagnosis not present

## 2020-05-02 DIAGNOSIS — M792 Neuralgia and neuritis, unspecified: Secondary | ICD-10-CM | POA: Diagnosis not present

## 2020-05-02 DIAGNOSIS — D696 Thrombocytopenia, unspecified: Secondary | ICD-10-CM | POA: Diagnosis not present

## 2020-05-02 DIAGNOSIS — N200 Calculus of kidney: Secondary | ICD-10-CM | POA: Diagnosis not present

## 2020-05-02 DIAGNOSIS — E1142 Type 2 diabetes mellitus with diabetic polyneuropathy: Secondary | ICD-10-CM | POA: Diagnosis not present

## 2020-05-02 DIAGNOSIS — Z87891 Personal history of nicotine dependence: Secondary | ICD-10-CM | POA: Diagnosis not present

## 2020-05-02 DIAGNOSIS — M5116 Intervertebral disc disorders with radiculopathy, lumbar region: Secondary | ICD-10-CM | POA: Diagnosis not present

## 2020-05-02 DIAGNOSIS — K219 Gastro-esophageal reflux disease without esophagitis: Secondary | ICD-10-CM | POA: Diagnosis not present

## 2020-05-02 DIAGNOSIS — Z794 Long term (current) use of insulin: Secondary | ICD-10-CM | POA: Diagnosis not present

## 2020-05-02 DIAGNOSIS — E785 Hyperlipidemia, unspecified: Secondary | ICD-10-CM | POA: Diagnosis not present

## 2020-05-02 DIAGNOSIS — Z9181 History of falling: Secondary | ICD-10-CM | POA: Diagnosis not present

## 2020-05-02 DIAGNOSIS — K760 Fatty (change of) liver, not elsewhere classified: Secondary | ICD-10-CM | POA: Diagnosis not present

## 2020-05-02 DIAGNOSIS — R338 Other retention of urine: Secondary | ICD-10-CM | POA: Diagnosis not present

## 2020-05-02 DIAGNOSIS — I129 Hypertensive chronic kidney disease with stage 1 through stage 4 chronic kidney disease, or unspecified chronic kidney disease: Secondary | ICD-10-CM | POA: Diagnosis not present

## 2020-05-02 DIAGNOSIS — N183 Chronic kidney disease, stage 3 unspecified: Secondary | ICD-10-CM | POA: Diagnosis not present

## 2020-05-02 DIAGNOSIS — E1122 Type 2 diabetes mellitus with diabetic chronic kidney disease: Secondary | ICD-10-CM | POA: Diagnosis not present

## 2020-05-02 DIAGNOSIS — M19019 Primary osteoarthritis, unspecified shoulder: Secondary | ICD-10-CM | POA: Diagnosis not present

## 2020-05-10 ENCOUNTER — Other Ambulatory Visit: Payer: Self-pay | Admitting: Family Medicine

## 2020-05-10 DIAGNOSIS — I152 Hypertension secondary to endocrine disorders: Secondary | ICD-10-CM

## 2020-05-10 DIAGNOSIS — M5116 Intervertebral disc disorders with radiculopathy, lumbar region: Secondary | ICD-10-CM | POA: Diagnosis not present

## 2020-05-10 DIAGNOSIS — M19019 Primary osteoarthritis, unspecified shoulder: Secondary | ICD-10-CM | POA: Diagnosis not present

## 2020-05-10 DIAGNOSIS — E1159 Type 2 diabetes mellitus with other circulatory complications: Secondary | ICD-10-CM

## 2020-05-10 DIAGNOSIS — N200 Calculus of kidney: Secondary | ICD-10-CM | POA: Diagnosis not present

## 2020-05-10 DIAGNOSIS — I129 Hypertensive chronic kidney disease with stage 1 through stage 4 chronic kidney disease, or unspecified chronic kidney disease: Secondary | ICD-10-CM | POA: Diagnosis not present

## 2020-05-10 DIAGNOSIS — E1122 Type 2 diabetes mellitus with diabetic chronic kidney disease: Secondary | ICD-10-CM | POA: Diagnosis not present

## 2020-05-10 DIAGNOSIS — Z9181 History of falling: Secondary | ICD-10-CM | POA: Diagnosis not present

## 2020-05-10 DIAGNOSIS — N183 Chronic kidney disease, stage 3 unspecified: Secondary | ICD-10-CM | POA: Diagnosis not present

## 2020-05-10 DIAGNOSIS — Z87891 Personal history of nicotine dependence: Secondary | ICD-10-CM | POA: Diagnosis not present

## 2020-05-10 DIAGNOSIS — Z794 Long term (current) use of insulin: Secondary | ICD-10-CM | POA: Diagnosis not present

## 2020-05-10 DIAGNOSIS — K760 Fatty (change of) liver, not elsewhere classified: Secondary | ICD-10-CM | POA: Diagnosis not present

## 2020-05-10 DIAGNOSIS — E1142 Type 2 diabetes mellitus with diabetic polyneuropathy: Secondary | ICD-10-CM | POA: Diagnosis not present

## 2020-05-10 DIAGNOSIS — R338 Other retention of urine: Secondary | ICD-10-CM | POA: Diagnosis not present

## 2020-05-10 DIAGNOSIS — E349 Endocrine disorder, unspecified: Secondary | ICD-10-CM

## 2020-05-10 DIAGNOSIS — M792 Neuralgia and neuritis, unspecified: Secondary | ICD-10-CM | POA: Diagnosis not present

## 2020-05-10 DIAGNOSIS — N1831 Chronic kidney disease, stage 3a: Secondary | ICD-10-CM

## 2020-05-10 DIAGNOSIS — D696 Thrombocytopenia, unspecified: Secondary | ICD-10-CM | POA: Diagnosis not present

## 2020-05-10 DIAGNOSIS — M5136 Other intervertebral disc degeneration, lumbar region: Secondary | ICD-10-CM

## 2020-05-10 DIAGNOSIS — J302 Other seasonal allergic rhinitis: Secondary | ICD-10-CM

## 2020-05-10 DIAGNOSIS — E785 Hyperlipidemia, unspecified: Secondary | ICD-10-CM | POA: Diagnosis not present

## 2020-05-10 DIAGNOSIS — K219 Gastro-esophageal reflux disease without esophagitis: Secondary | ICD-10-CM | POA: Diagnosis not present

## 2020-05-10 MED ORDER — FLUTICASONE PROPIONATE 50 MCG/ACT NA SUSP: 1.0000 | Freq: Every day | NASAL | 0 refills | Status: DC

## 2020-05-10 MED ORDER — LEVEMIR FLEXTOUCH 100 UNIT/ML ~~LOC~~ SOPN: PEN_INJECTOR | SUBCUTANEOUS | 0 refills | Status: DC

## 2020-05-10 MED ORDER — LISINOPRIL 5 MG PO TABS: 5.0000 mg | ORAL_TABLET | Freq: Every day | ORAL | 0 refills | Status: DC

## 2020-05-10 MED ORDER — NOVOLOG FLEXPEN 100 UNIT/ML ~~LOC~~ SOPN: 0.0000 [IU] | PEN_INJECTOR | Freq: Two times a day (BID) | SUBCUTANEOUS | 2 refills | Status: DC

## 2020-05-10 MED ORDER — LINAGLIPTIN 5 MG PO TABS: ORAL_TABLET | ORAL | 0 refills | Status: DC

## 2020-05-10 MED ORDER — GABAPENTIN 300 MG PO CAPS: 300.0000 mg | ORAL_CAPSULE | Freq: Two times a day (BID) | ORAL | 0 refills | Status: DC

## 2020-05-10 NOTE — Telephone Encounter (Signed)
Medication: fenofibrate (TRICOR) 48 MG tablet [[id number]] ,tiZANidine (ZANAFLEX) 4 MG tablet [[id number]] , gabapentin (NEURONTIN) 300 MG capsule [[id number]] , insulin aspart (NOVOLOG FLEXPEN) 100 UNIT/ML FlexPen [[id number]] , insulin detemir (LEVEMIR FLEXTOUCH) 100 UNIT/ML FlexPen [[id number]] , linagliptin (TRADJENTA) 5 MG TABS tablet [[id number]] , lisinopril (ZESTRIL) 5 MG tablet [[id number]] , traMADol (ULTRAM) 50 MG tablet [[id number]], Testosterone (ANDROGEL) 20.25 MG/1.25GM (1.62%) GEL [[id number]] , alfuzosin (UROXATRAL) 10 MG 24 hr tablet [[id number]] , fluticasone (FLONASE) 50 MCG/ACT nasal spray [[id number]] ,   Has the patient contacted their pharmacy? YES (Agent: If no, request that the patient contact the pharmacy for the refill.) (Agent: If yes, when and what did the pharmacy advise?)  Preferred Pharmacy (with phone number or street name): [facility] [phone]  Agent: Please be advised that RX refills may take up to 3 business days. We ask that you follow-up with your pharmacy.

## 2020-05-10 NOTE — Telephone Encounter (Signed)
Requested medication (s) are due for refill today: yes  Requested medication (s) are on the active medication list: yes  Future visit scheduled: no  Notes to clinic:  Please review for refill. Refills not delegated per protocol. Fenofibrate and Alfuzosin previously filled by another provider.     Requested Prescriptions  Pending Prescriptions Disp Refills   alfuzosin (UROXATRAL) 10 MG 24 hr tablet      Sig: Take 1 tablet (10 mg total) by mouth at bedtime.      Urology: Alpha-Adrenergic Blocker Passed - 05/10/2020  3:32 PM      Passed - Last BP in normal range    BP Readings from Last 1 Encounters:  03/21/20 124/85          Passed - Valid encounter within last 12 months    Recent Outpatient Visits           2 months ago Hypertension in stage 3 chronic kidney disease due to type 2 diabetes mellitus ()   [place], Ekron, MD   5 months ago Essential hypertension   [place], Charlane Ferretti, MD   6 months ago Type 2 diabetes mellitus with stage 3a chronic kidney disease, with long-term current use of insulin ([place])   [place], Charlane Ferretti, MD   7 months ago Type 2 diabetes mellitus with stage 3a chronic kidney disease, with long-term current use of insulin [facility])   [place], [place], [place]   8 months ago Type 2 diabetes mellitus with stage 3a chronic kidney disease, with long-term current use of insulin ([place])   Durant, [place]bury, MD                  tiZANidine (ZANAFLEX) 4 MG tablet 90 tablet 3    Sig: Take 1 tablet (4 mg total) by mouth every 8 (eight) hours as needed for muscle spasms.      Not Delegated - Cardiovascular:  Alpha-2 Agonists - tizanidine Failed - 05/10/2020  3:32 PM      Failed - This refill cannot be delegated      Passed - Valid encounter within last 6 months     Recent Outpatient Visits           2 months ago Hypertension in stage 3 chronic kidney disease due to type 2 diabetes mellitus ([place])   Leroy, Dans[place], MD   5 months ago Essential hypertension   [place], Charlane Ferretti, MD   6 months ago Type 2 diabetes mellitus with stage 3a chronic kidney disease, with long-term current use of insulin ([place])   [place], Charlane Ferretti, MD   7 months ago Type 2 diabetes mellitus with stage 3a chronic kidney disease, with long-term current use of insulin ([place])   [place], [place], [place]   8 months ago Type 2 diabetes mellitus with stage 3a chronic kidney disease, with long-term current use of insulin ([place])   [place]brook, Wanda, MD                  traMADol (ULTRAM) 50 MG tablet 30 tablet 2    Sig: Take 1 tablet (50 mg total) by mouth at bedtime as needed (pain).  Not Delegated - Analgesics:  Opioid Agonists Failed - 05/10/2020  3:32 PM      Failed - This refill cannot be delegated      Passed - Urine Drug Screen completed in last 360 days      Passed - Valid encounter within last 6 months    Recent Outpatient Visits           2 months ago Hypertension in stage 3 chronic kidney disease due to type 2 diabetes mellitus ([place])   [place], [employer], MD   5 months ago Essential hypertension   [place], Charlane Ferretti, MD   6 months ago Type 2 diabetes mellitus with stage 3a chronic kidney disease, with long-term current use of insulin ([place])   [place], Charlane Ferretti, MD   7 months ago Type 2 diabetes mellitus with stage 3a chronic kidney disease, with long-term current use of insulin [facility])   [place], [place], [place]    8 months ago Type 2 diabetes mellitus with stage 3a chronic kidney disease, with long-term current use of insulin ([place])   [place], [place], MD                  Testosterone (ANDROGEL) 20.25 MG/1.25GM (1.62%) GEL 1.25 g 3    Sig: Apply 20.25 mg topically every morning. Apply topically under arm pit alternating with each application.      Off-Protocol Failed - 05/10/2020  3:32 PM      Failed - Medication not assigned to a protocol, review manually.      Passed - Valid encounter within last 12 months    Recent Outpatient Visits           2 months ago Hypertension in stage 3 chronic kidney disease due to type 2 diabetes mellitus ([place])   [place], Lely, MD   5 months ago Essential hypertension   [place], Charlane Ferretti, MD   6 months ago Type 2 diabetes mellitus with stage 3a chronic kidney disease, with long-term current use of insulin ([place])   [place], Charlane Ferretti, MD   7 months ago Type 2 diabetes mellitus with stage 3a chronic kidney disease, with long-term current use of insulin [facility])   [place], [place], [place]   8 months ago Type 2 diabetes mellitus with stage 3a chronic kidney disease, with long-term current use of insulin ([place])   [place], Bethune, MD                  fenofibrate (TRICOR) 48 MG tablet      Sig: Take 1 tablet (48 mg total) by mouth daily.      Cardiovascular:  Antilipid - Fibric Acid Derivatives Failed - 05/10/2020  3:32 PM      Failed - LDL in normal range and within 360 days    LDL Chol Calc (NIH)  Date Value Ref Range Status  08/25/2019 58 0 - 99 mg/dL Final   Direct LDL  Date Value Ref Range Status  06/02/2016 72.0 mg/dL Final    Comment:    Optimal:  <100 mg/dLNear or Above Optimal:  100-129 mg/dLBorderline  High:  130-159 mg/dLHigh:  160-189 mg/dLVery High:  >190 mg/dL  Failed - HDL in normal range and within 360 days    HDL  Date Value Ref Range Status  08/25/2019 37 (L) >39 mg/dL Final          Failed - Triglycerides in normal range and within 360 days    Triglycerides  Date Value Ref Range Status  08/25/2019 249 (H) 0 - 149 mg/dL Final          Failed - AST in normal range and within 180 days    AST  Date Value Ref Range Status  01/02/2020 47 (H) 15 - 41 U/L Final          Failed - Cr in normal range and within 180 days    Creatinine, Ser  Date Value Ref Range Status  01/04/2020 1.48 (H) 0.61 - 1.24 mg/dL Final   Creatinine,U  Date Value Ref Range Status  04/18/2015 250.2 mg/dL Final   Creatinine, Urine  Date Value Ref Range Status  10/03/2015 35.87 mg/dL Final          Failed - eGFR in normal range and within 180 days    GFR calc Af Amer  Date Value Ref Range Status  01/04/2020 >60 >60 mL/min Final   GFR calc non Af Amer  Date Value Ref Range Status  01/04/2020 54 (L) >60 mL/min Final   GFR  Date Value Ref Range Status  06/02/2016 33.36 (L) >60.00 mL/min Final          Passed - Total Cholesterol in normal range and within 360 days    Cholesterol, Total  Date Value Ref Range Status  08/25/2019 135 100 - 199 mg/dL Final          Passed - ALT in normal range and within 180 days    ALT  Date Value Ref Range Status  01/02/2020 16 0 - 44 U/L Final          Passed - Valid encounter within last 12 months    Recent Outpatient Visits           2 months ago Hypertension in stage 3 chronic kidney disease due to type 2 diabetes mellitus ([place])   [place], [place], MD   5 months ago Essential hypertension   [place], Kelso, MD   6 months ago Type 2 diabetes mellitus with stage 3a chronic kidney disease, with long-term current use of insulin ([place])   Dinosaur, [place], MD   7 months ago Type 2 diabetes mellitus with stage 3a chronic kidney disease, with long-term current use of insulin ([place])   [place], [place], [place]   8 months ago Type 2 diabetes mellitus with stage 3a chronic kidney disease, with long-term current use of insulin ([place])   [place], Charlane Ferretti, MD                 Signed Prescriptions Disp Refills   insulin detemir (LEVEMIR FLEXTOUCH) 100 UNIT/ML FlexPen 90 mL 0    Sig: INJECT 45 UNITS INTO THE SKIN 2 TIMES DAILY.      Endocrinology:  Diabetes - Insulins Passed - 05/10/2020  3:32 PM      Passed - HBA1C is between 0 and 7.9 and within 180 days    HbA1c, POC (controlled diabetic range)  Date Value Ref Range Status  11/01/2019 9.0 (A) 0.0 - 7.0 %  Final   Hgb A1c MFr Bld  Date Value Ref Range Status  01/03/2020 7.9 (H) 4.8 - 5.6 % Final    Comment:    (NOTE) Pre diabetes:          5.7%-6.4%  Diabetes:              >6.4%  Glycemic control for   <7.0% adults with diabetes           Passed - Valid encounter within last 6 months    Recent Outpatient Visits           2 months ago Hypertension in stage 3 chronic kidney disease due to type 2 diabetes mellitus ([place])   Oliver, [place], MD   5 months ago Essential hypertension   [place], Charlane Ferretti, MD   6 months ago Type 2 diabetes mellitus with stage 3a chronic kidney disease, with long-term current use of insulin ([place])   [place], [place], MD   7 months ago Type 2 diabetes mellitus with stage 3a chronic kidney disease, with long-term current use of insulin ([place])   [place], [place], [place]   8 months ago Type 2 diabetes mellitus with stage 3a chronic kidney disease, with long-term current use of  insulin ([place])   [place], Udell, MD                  insulin aspart (NOVOLOG FLEXPEN) 100 UNIT/ML FlexPen 30 mL 2    Sig: Inject 0-12 Units into the skin 2 (two) times daily. per sliding scale      Endocrinology:  Diabetes - Insulins Passed - 05/10/2020  3:32 PM      Passed - HBA1C is between 0 and 7.9 and within 180 days    HbA1c, POC (controlled diabetic range)  Date Value Ref Range Status  11/01/2019 9.0 (A) 0.0 - 7.0 % Final   Hgb A1c MFr Bld  Date Value Ref Range Status  01/03/2020 7.9 (H) 4.8 - 5.6 % Final    Comment:    (NOTE) Pre diabetes:          5.7%-6.4%  Diabetes:              >6.4%  Glycemic control for   <7.0% adults with diabetes           Passed - Valid encounter within last 6 months    Recent Outpatient Visits           2 months ago Hypertension in stage 3 chronic kidney disease due to type 2 diabetes mellitus ([place])   [place], [place], MD   5 months ago Essential hypertension   [place], [place], MD   6 months ago Type 2 diabetes mellitus with stage 3a chronic kidney disease, with long-term current use of insulin ([place])   [place], [place], MD   7 months ago Type 2 diabetes mellitus with stage 3a chronic kidney disease, with long-term current use of insulin [facility])   [place], [place], [place]   8 months ago Type 2 diabetes mellitus with stage 3a chronic kidney disease, with long-term current use of insulin ([place])   [facility] [name], MD  lisinopril (ZESTRIL) 5 MG tablet 90 tablet 0    Sig: Take 1 tablet (5 mg total) by mouth daily.      Cardiovascular:  ACE Inhibitors Failed - 05/10/2020  3:32 PM      Failed - Cr in normal range and within 180 days    Creatinine, Ser  Date Value Ref  Range Status  01/04/2020 1.48 (H) 0.61 - 1.24 mg/dL Final   Creatinine,U  Date Value Ref Range Status  04/18/2015 250.2 mg/dL Final   Creatinine, Urine  Date Value Ref Range Status  10/03/2015 35.87 mg/dL Final          Passed - K in normal range and within 180 days    Potassium  Date Value Ref Range Status  01/04/2020 3.9 3.5 - 5.1 mmol/L Final          Passed - Patient is not pregnant      Passed - Last BP in normal range    BP Readings from Last 1 Encounters:  03/21/20 124/85          Passed - Valid encounter within last 6 months    Recent Outpatient Visits           2 months ago Hypertension in stage 3 chronic kidney disease due to type 2 diabetes mellitus ([place])   Ahmeek, [place], MD   5 months ago Essential hypertension   [place], [place], MD   6 months ago Type 2 diabetes mellitus with stage 3a chronic kidney disease, with long-term current use of insulin ([place])   [place], [place], MD   7 months ago Type 2 diabetes mellitus with stage 3a chronic kidney disease, with long-term current use of insulin ([place])   [place], [place], [place]   8 months ago Type 2 diabetes mellitus with stage 3a chronic kidney disease, with long-term current use of insulin ([place])   [place], [place], MD                  linagliptin (TRADJENTA) 5 MG TABS tablet 90 tablet 0    Sig: TAKE 1 TABLET (5 MG TOTAL) BY MOUTH DAILY.      Endocrinology:  Diabetes - DPP-4 Inhibitors - linagliptin Passed - 05/10/2020  3:32 PM      Passed - HBA1C is between 0 and 7.9 and within 180 days    HbA1c, POC (controlled diabetic range)  Date Value Ref Range Status  11/01/2019 9.0 (A) 0.0 - 7.0 % Final   Hgb A1c MFr Bld  Date Value Ref Range Status  01/03/2020 7.9 (H) 4.8 - 5.6 % Final    Comment:     (NOTE) Pre diabetes:          5.7%-6.4%  Diabetes:              >6.4%  Glycemic control for   <7.0% adults with diabetes           Passed - Valid encounter within last 6 months    Recent Outpatient Visits           2 months ago Hypertension in stage 3 chronic kidney disease due to type 2 diabetes mellitus ([place])   [place], Charlane Ferretti, MD   5 months ago Essential hypertension   [place],  Charlane Ferretti, MD   6 months ago Type 2 diabetes mellitus with stage 3a chronic kidney disease, with long-term current use of insulin ([place])   [place]by, [place], MD   7 months ago Type 2 diabetes mellitus with stage 3a chronic kidney disease, with long-term current use of insulin [facility])   [place], [place], [place]   8 months ago Type 2 diabetes mellitus with stage 3a chronic kidney disease, with long-term current use of insulin ([place])   [place], Charlane Ferretti, MD                  gabapentin (NEURONTIN) 300 MG capsule 180 capsule 0    Sig: Take 1 capsule (300 mg total) by mouth 2 (two) times daily.      Neurology: Anticonvulsants - gabapentin Passed - 05/10/2020  3:32 PM      Passed - Valid encounter within last 12 months    Recent Outpatient Visits           2 months ago Hypertension in stage 3 chronic kidney disease due to type 2 diabetes mellitus ([place])   [place], Rocky, MD   5 months ago Essential hypertension   [place], Charlane Ferretti, MD   6 months ago Type 2 diabetes mellitus with stage 3a chronic kidney disease, with long-term current use of insulin ([place])   [place], Charlane Ferretti, MD   7 months ago Type 2 diabetes mellitus with stage 3a chronic kidney disease, with long-term current use of  insulin [facility])   [place], [place], [place]   8 months ago Type 2 diabetes mellitus with stage 3a chronic kidney disease, with long-term current use of insulin ([place])   [place], [place], MD                  fluticasone (FLONASE) 50 MCG/ACT nasal spray 48 g 0    Sig: Place 1 spray into both nostrils daily. SHAKE LIQUID AND USE 2 SPRAYS IN EACH NOSTRIL DAILY      Ear, Nose, and Throat: Nasal Preparations - Corticosteroids Passed - 05/10/2020  3:32 PM      Passed - Valid encounter within last 12 months    Recent Outpatient Visits           2 months ago Hypertension in stage 3 chronic kidney disease due to type 2 diabetes mellitus ([place])   [place], [place], MD   5 months ago Essential hypertension   [place], [place], MD   6 months ago Type 2 diabetes mellitus with stage 3a chronic kidney disease, with long-term current use of insulin ([place])   [place], Burton, MD   7 months ago Type 2 diabetes mellitus with stage 3a chronic kidney disease, with long-term current use of insulin [facility])   [place], [place], [place]   8 months ago Type 2 diabetes mellitus with stage 3a chronic kidney disease, with long-term current use of insulin ([place])   [facility] [name], MD

## 2020-05-11 ENCOUNTER — Telehealth: Payer: Self-pay | Admitting: Family Medicine

## 2020-05-11 ENCOUNTER — Encounter: Payer: Self-pay | Admitting: Internal Medicine

## 2020-05-11 ENCOUNTER — Ambulatory Visit: Payer: Medicare Other | Attending: Internal Medicine | Admitting: Internal Medicine

## 2020-05-11 ENCOUNTER — Other Ambulatory Visit: Payer: Self-pay

## 2020-05-11 VITALS — BP 122/79 | HR 85 | Temp 98.0°F | Resp 16 | Wt 207.0 lb

## 2020-05-11 DIAGNOSIS — E1122 Type 2 diabetes mellitus with diabetic chronic kidney disease: Secondary | ICD-10-CM

## 2020-05-11 DIAGNOSIS — N1831 Chronic kidney disease, stage 3a: Secondary | ICD-10-CM

## 2020-05-11 DIAGNOSIS — Z794 Long term (current) use of insulin: Secondary | ICD-10-CM | POA: Diagnosis not present

## 2020-05-11 DIAGNOSIS — J302 Other seasonal allergic rhinitis: Secondary | ICD-10-CM

## 2020-05-11 DIAGNOSIS — F17200 Nicotine dependence, unspecified, uncomplicated: Secondary | ICD-10-CM

## 2020-05-11 DIAGNOSIS — E349 Endocrine disorder, unspecified: Secondary | ICD-10-CM | POA: Diagnosis not present

## 2020-05-11 LAB — POCT GLYCOSYLATED HEMOGLOBIN (HGB A1C): HbA1c, POC (controlled diabetic range): 6.4 % (ref 0.0–7.0)

## 2020-05-11 LAB — GLUCOSE, POCT (MANUAL RESULT ENTRY): POC Glucose: 146 mg/dl — AB (ref 70–99)

## 2020-05-11 MED ORDER — TRAZODONE HCL 150 MG PO TABS: 150.0000 mg | ORAL_TABLET | Freq: Every day | ORAL | 1 refills | Status: DC

## 2020-05-11 MED ORDER — TIZANIDINE HCL 4 MG PO TABS: 4.0000 mg | ORAL_TABLET | Freq: Three times a day (TID) | ORAL | 2 refills | Status: DC | PRN

## 2020-05-11 MED ORDER — TESTOSTERONE 20.25 MG/1.25GM (1.62%) TD GEL: 20.2500 mg | Freq: Every morning | TRANSDERMAL | 0 refills | Status: DC

## 2020-05-11 MED ORDER — FENOFIBRATE 48 MG PO TABS: 48.0000 mg | ORAL_TABLET | Freq: Every day | ORAL | 1 refills | Status: DC

## 2020-05-11 MED ORDER — NICOTINE 14 MG/24HR TD PT24: 14.0000 mg | MEDICATED_PATCH | Freq: Every day | TRANSDERMAL | 1 refills | Status: DC

## 2020-05-11 MED ORDER — ATORVASTATIN CALCIUM 10 MG PO TABS: 10.0000 mg | ORAL_TABLET | Freq: Every day | ORAL | 1 refills | Status: DC

## 2020-05-11 NOTE — Telephone Encounter (Signed)
[place] Tech calling from [facility] is calling to clairify script for Flonase sig states - Place 1 spray into both nostrils daily. SHAKE LIQUID AND USE 2 SPRAYS IN EACH NOSTRIL DAILY  Wanting to know if the patient should receieve 1 spray or 2 sprays?  Please advise [phone] Ref [phone]

## 2020-05-11 NOTE — Progress Notes (Signed)
Patient ID: [name]., male    DOB: [birth year]  MRN: [id number]  CC: Diabetes   Subjective: [name] is a 55 y.o. male who presents for DM management.  PCP is [place] His concerns today include:  [name]. 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  He is switched to [facility]. Request RF on nicotine patch to go to [facility] instead of [facility]. W[facility] Trazodone, Lipitor, [facility]  Also request that tramadol be transferred to [facility].  DM: checking multiple times a day "like 20x/day." Has Libre meter for glucose monitoring. He thinks hsi DM control is poor.  Thinks his meter is off by 50 points.   On Levemir 45 units BID and Novolog SSI.  Sensor in [place] device set to go off when BS 200 or >.  When his device goes off, he gives himself 4 units Novolog even if he has not eaten or intends to eat at the time.  He reports no recent low blood sugar episodes. Results for orders placed or performed in visit on 05/11/20  POCT glucose (manual entry)  Result Value Ref Range   POC Glucose 146 (A) 70 - 99 mg/dl  POCT glycosylated hemoglobin (Hb A1C)  Result Value Ref Range   Hemoglobin A1C     HbA1c POC (<> result, manual entry)     HbA1c, POC (prediabetic range)     HbA1c, POC (controlled diabetic range) 6.4 0.0 - 7.0 %     Reports having received Pfizer COVID booster and flu shot 04/04/2021 at [facility]  Patient Active Problem List   Diagnosis Date Noted  . Pressure injury of skin 01/02/2020  . Dehydration   . Hyperglycemia   . Rhabdomyolysis 01/01/2020  . Recurrent falls 05/15/2019  . AKI (acute kidney injury) ([place]) 05/14/2019  . Closed displaced  fracture of lateral malleolus of left fibula 08/02/2017  . BPH (benign prostatic hyperplasia) 07/24/2017  . Hemorrhoid 05/27/2017  . Shoulder arthritis 05/27/2017  . Diabetic neuropathy ([place]) 05/27/2017  . Degenerative disc disease, lumbar 02/04/2017  . Constipation   . History of seizures 09/20/2016  . Acute lower UTI   . Urinary retention   . HLD (hyperlipidemia) 09/07/2016  . GERD (gastroesophageal reflux disease) 09/07/2016  . PTSD (post-traumatic stress disorder) 09/07/2016  . Fall 09/07/2016  . Chronic kidney disease   . Ataxia 10/14/2015  . Hypokalemia 10/03/2015  . Hemiparesis ([place])   . Dyslipidemia 05/31/2014  . Insomnia 11/11/2013  . Degenerative joint disease (DJD) of hip 08/16/2013  . Acute midline low back pain without sciatica 05/20/2011  . Erectile dysfunction 02/17/2011  . Constipation - functional 02/17/2011  . Diabetes type 2, uncontrolled ([place]) 11/06/2010  . Hypogonadism male 05/20/2010  . TOBACCO USER 05/20/2010  . Demoralization and apathy 05/20/2010  . Schizoaffective disorder, bipolar type ([place]) 01/18/2010  . Right shoulder pain 01/18/2010  . Anxiety state 12/05/2006  . Essential hypertension 12/05/2006     Current Outpatient Medications on File Prior to Visit  Medication Sig Dispense Refill  . acetaminophen (TYLENOL) 325 MG tablet Take 2 tablets (650 mg total) by mouth every 6 (six) hours as needed for mild pain (or Fever >/= 101).    Marland Kitchen  alfuzosin (UROXATRAL) 10 MG 24 hr tablet Take 10 mg by mouth at bedtime.     . ARIPiprazole (ABILIFY) 5 MG tablet TAKE 1 TABLET BY MOUTH DAILY FOR DEPRESSION (Patient taking differently: Take 5 mg by mouth daily.) 30 tablet 0  . atorvastatin (LIPITOR) 10 MG tablet Take 1 tablet (10 mg total) by mouth at bedtime. 90 tablet 1  . Blood Glucose Monitoring Suppl (ACCU-CHEK GUIDE ME) w/Device KIT Use to check blood sugar TID. E11.21 1 kit 0  . buPROPion (WELLBUTRIN XL) 300 MG 24 hr tablet Take 1 tablet (300 mg total) by mouth  daily. 30 tablet 0  . busPIRone (BUSPAR) 15 MG tablet Take 15 mg by mouth 2 (two) times daily.     . Continuous Blood Gluc Receiver (FREESTYLE LIBRE 14 DAY READER) DEVI Use as directed 1 each 4  . Continuous Blood Gluc Sensor (FREESTYLE LIBRE 14 DAY SENSOR) MISC Use as directed 2 each 4  . diclofenac sodium (VOLTAREN) 1 % GEL Apply 4 g topically 4 (four) times daily. 100 g 1  . divalproex (DEPAKOTE ER) 500 MG 24 hr tablet Take 500 mg by mouth daily.    . divalproex (DEPAKOTE) 500 MG DR tablet Take 3 tablets (1,500 mg total) by mouth at bedtime. 90 tablet 0  . feeding supplement, ENSURE ENLIVE, (ENSURE ENLIVE) LIQD Take 237 mLs by mouth 2 (two) times daily between meals. 237 mL 12  . fenofibrate (TRICOR) 48 MG tablet Take 48 mg by mouth daily.    . ferrous sulfate 325 (65 FE) MG tablet Take 1 tablet (325 mg total) by mouth daily with breakfast. 30 tablet 11  . fluticasone (FLONASE) 50 MCG/ACT nasal spray Place 1 spray into both nostrils daily. SHAKE LIQUID AND USE 2 SPRAYS IN EACH NOSTRIL DAILY 48 g 0  . gabapentin (NEURONTIN) 300 MG capsule Take 1 capsule (300 mg total) by mouth 2 (two) times daily. 180 capsule 0  . glucose blood (ACCU-CHEK GUIDE) test strip CHECK SUGAR FOUR TIMES DAILY 100 each 12  . insulin aspart (NOVOLOG FLEXPEN) 100 UNIT/ML FlexPen Inject 0-12 Units into the skin 2 (two) times daily. per sliding scale 30 mL 2  . insulin detemir (LEVEMIR FLEXTOUCH) 100 UNIT/ML FlexPen INJECT 45 UNITS INTO THE SKIN 2 TIMES DAILY. 90 mL 0  . Insulin Pen Needle (B-D UF III MINI PEN NEEDLES) 31G X 5 MM MISC USE FOUR TIMES DAILY 120 each 12  . lidocaine (LIDODERM) 5 % Place 1 patch onto the skin daily. Remove & Discard patch within 12 hours or as directed by MD (Patient not taking: Reported on 03/21/2020) 30 patch 0  . linagliptin (TRADJENTA) 5 MG TABS tablet TAKE 1 TABLET (5 MG TOTAL) BY MOUTH DAILY. 90 tablet 0  . lisinopril (ZESTRIL) 5 MG tablet Take 1 tablet (5 mg total) by mouth daily. 90 tablet  0  . Misc. Devices MISC Rolator Walker 1 each 0  . NARCAN 4 MG/0.1ML LIQD nasal spray kit CALL 911. ADMINISTER A SINGLE SPRAY OF NARCAN IN ONE NOSTRIL. REPEAT EVERY 3 MINUTES AS NEEDED IF NO OR MINIMAL RESPONSE.    . nicotine (NICODERM CQ) 14 mg/24hr patch Place 1 patch (14 mg total) onto the skin daily. Then 7 mg/24 hr patch for 1 month 28 patch 1  . QUEtiapine (SEROQUEL) 400 MG tablet Take 400 mg by mouth as needed. (Patient not taking: Reported on 03/21/2020)    . tadalafil (CIALIS) 5 MG tablet TAKE ONE TABLET BY MOUTH EVERY DAY FOR  BPH 90 tablet 0  . Testosterone (ANDROGEL) 20.25 MG/1.25GM (1.62%) GEL Apply 20.25 mg topically every morning. Apply topically under arm pit alternating with each application. 1.25 g 3  . tiZANidine (ZANAFLEX) 4 MG tablet Take 1 tablet (4 mg total) by mouth every 8 (eight) hours as needed for muscle spasms. 90 tablet 3  . traMADol (ULTRAM) 50 MG tablet Take 1 tablet (50 mg total) by mouth at bedtime as needed (pain). 30 tablet 2  . traZODone (DESYREL) 150 MG tablet Take 1 tablet (150 mg total) by mouth at bedtime. 90 tablet 1   No current facility-administered medications on file prior to visit.    Allergies  Allergen Reactions  . Vicodin [Hydrocodone-Acetaminophen] Itching    Social History   Socioeconomic History  . Marital status: Single    Spouse name: Not on file  . Number of children: 0  . Years of education: Not on file  . Highest education level: Some college, no degree  Occupational History  . Occupation: disability  Tobacco Use  . Smoking status: Former Smoker    Years: 0.25    Types: Cigarettes    Quit date: 03/22/2019    Years since quitting: 1.1  . Smokeless tobacco: Never Used  Vaping Use  . Vaping Use: Never used  Substance and Sexual Activity  . Alcohol use: Not Currently  . Drug use: No    Comment: hx of marijuana/cocaine/crack use but sober since 20's  . Sexual activity: Not Currently  Other Topics Concern  . Not on file   Social History Narrative   09/14/19 lives alone, sister [name] helps with meds, he has some in home care, lived with sister until Nov 2020   Caffeine- sodas, amount  varies   regular exercise-no   Social Determinants of Health   Financial Resource Strain: Not on file  Food Insecurity: Not on file  Transportation Needs: Not on file  Physical Activity: Not on file  Stress: Not on file  Social Connections: Not on file  Intimate Partner Violence: Not on file    Family History  Problem Relation Age of Onset  . Diabetes Father   . Cancer Mother        died of melanoma with mets  . Cervical cancer Sister   . Diabetes Sister   . Other Neg Hx        hypogonadism  . Colon cancer Neg Hx   . Colon polyps Neg Hx   . Esophageal cancer Neg Hx   . Rectal cancer Neg Hx   . Stomach cancer Neg Hx     Past Surgical History:  Procedure Laterality Date  . BACK SURGERY    . CLOSED REDUCTION METACARPAL WITH PERCUTANEOUS PINNING Right   . LUMBAR DISC SURGERY    . TONSILLECTOMY    . TOTAL HIP ARTHROPLASTY Right 08/16/2013   Procedure: TOTAL HIP ARTHROPLASTY ANTERIOR APPROACH;  Surgeon: Hessie Dibble, MD;  Location: [place];  Service: Orthopedics;  Laterality: Right;  . TOTAL HIP ARTHROPLASTY Left 05/22/2015   Procedure: TOTAL HIP ARTHROPLASTY ANTERIOR APPROACH;  Surgeon: Melrose Nakayama, MD;  Location: [place];  Service: Orthopedics;  Laterality: Left;    ROS: Review of Systems Negative except as stated above  PHYSICAL EXAM: BP 122/79   Pulse 85   Temp 98 F (36.7 C)   Resp 16   Wt 207 lb (93.9 kg)   SpO2 98%   BMI 24.55 kg/m   Physical Exam  General appearance - alert,  well appearing, middle-age Caucasian male and in no distress Mental status - normal mood, behavior, speech, dress, motor activity, and thought processes Chest - clear to auscultation, no wheezes, rales or rhonchi, symmetric air entry Heart - normal rate, regular rhythm, normal S1, S2, no murmurs, rubs, clicks or  gallops  CMP Latest Ref Rng & Units 01/04/2020 01/03/2020 01/02/2020  Glucose 70 - 99 mg/dL 210(H) 147(H) 162(H)  BUN 6 - 20 mg/dL 27(H) 31(H) 35(H)  Creatinine 0.61 - 1.24 mg/dL 1.48(H) 1.43(H) 1.64(H)  Sodium 135 - 145 mmol/L 134(L) 138 139  Potassium 3.5 - 5.1 mmol/L 3.9 4.7 4.2  Chloride 98 - 111 mmol/L 100 105 104  CO2 22 - 32 mmol/L _0 Calcium 8.9 - 10.3 mg/dL 9.1 9.1 8.9  Total Protein 6.5 - 8.1 g/dL - - -  Total Bilirubin 0.3 - 1.2 mg/dL - - -  Alkaline Phos 38 - 126 U/L - - -  AST 15 - 41 U/L - - -  ALT 0 - 44 U/L - - -   Lipid Panel     Component Value Date/Time   CHOL 135 08/25/2019 1134   TRIG 249 (H) 08/25/2019 1134   HDL 37 (L) 08/25/2019 1134   CHOLHDL 3.6 08/25/2019 1134   CHOLHDL 7 06/02/2016 0844   VLDL 41 (H) 10/03/2015 0434   LDLCALC 58 08/25/2019 1134   LDLDIRECT 72.0 06/02/2016 0844    CBC    Component Value Date/Time   WBC 8.0 01/01/2020 1520   RBC 4.73 01/01/2020 1520   HGB 13.9 01/01/2020 1520   HGB 16.5 05/12/2018 1647   HCT 41.2 01/01/2020 1520   HCT 47.9 05/12/2018 1647   PLT 124 (L) 01/01/2020 1520   PLT 123 (L) 05/12/2018 1647   MCV 87.1 01/01/2020 1520   MCV 95 05/12/2018 1647   MCH 29.4 01/01/2020 1520   MCHC 33.7 01/01/2020 1520   RDW 13.2 01/01/2020 1520   RDW 15.4 05/12/2018 1647   LYMPHSABS 1.9 05/17/2019 0532   LYMPHSABS 2.3 05/12/2018 1647   MONOABS 0.4 05/17/2019 0532   EOSABS 0.0 05/17/2019 0532   EOSABS 0.0 05/12/2018 1647   BASOSABS 0.0 05/17/2019 0532   BASOSABS 0.0 05/12/2018 1647    ASSESSMENT AND PLAN:  1. Type 2 diabetes mellitus with stage 3a chronic kidney disease, with long-term current use of insulin (HCC) A1c is at goal.  He reports no recent hypoglycemic episodes.  Advised him to take NovoLog insulin with meals only.  Advised against taking it randomly throughout the day when the sensor for his libre meter goes off.  He will touch base with the company that he got the [place] meter from to let them know  the problems that he is having with the meter. - POCT glucose (manual entry) - POCT glycosylated hemoglobin (Hb A1C) - atorvastatin (LIPITOR) 10 MG tablet; Take 1 tablet (10 mg total) by mouth at bedtime.  Dispense: 90 tablet; Refill: 1  2. Testosterone deficiency I have sent refills to the pharmacy that he requested - Testosterone (ANDROGEL) 20.25 MG/1.25GM (1.62%) GEL; Apply 20.25 mg topically every morning. Apply topically under arm pit alternating with each application.  Dispense: 1.25 g; Refill: 0  3. Tobacco dependence Encouraged to quit.  Patient actively trying to quit.  Refill on nicotine patch sent to [facility] per his request.     Patient was given the opportunity to ask questions.  Patient verbalized understanding of the plan and was able to repeat key elements  of the plan.   Orders Placed This Encounter  Procedures  . POCT glucose (manual entry)  . POCT glycosylated hemoglobin (Hb A1C)     Requested Prescriptions    No prescriptions requested or ordered in this encounter    No follow-ups on file.  Karle Plumber, MD, FACP

## 2020-05-11 NOTE — Patient Instructions (Signed)
Please call the company regarding your glucometer machine if you think it is malfunctioning.  You should take NovoLog insulin with meals only.  He should not take this in between meals.

## 2020-05-13 ENCOUNTER — Other Ambulatory Visit: Payer: Self-pay | Admitting: Family Medicine

## 2020-05-13 MED ORDER — TRAMADOL HCL 50 MG PO TABS: 50.0000 mg | ORAL_TABLET | Freq: Every evening | ORAL | 2 refills | Status: DC | PRN

## 2020-05-14 MED ORDER — FLUTICASONE PROPIONATE 50 MCG/ACT NA SUSP: 1.0000 | Freq: Every day | NASAL | 3 refills | Status: DC

## 2020-05-14 NOTE — Telephone Encounter (Signed)
Clarification is needed.

## 2020-05-14 NOTE — Telephone Encounter (Signed)
Done

## 2020-05-21 ENCOUNTER — Telehealth: Payer: Self-pay | Admitting: Family Medicine

## 2020-05-21 DIAGNOSIS — S91209A Unspecified open wound of unspecified toe(s) with damage to nail, initial encounter: Secondary | ICD-10-CM

## 2020-05-21 NOTE — Telephone Encounter (Signed)
Requesting referral to podiatry.

## 2020-05-21 NOTE — Telephone Encounter (Signed)
Copied from [place] [id number]. Topic: General - Inquiry >> May 21, 2020 10:19 AM Greggory Keen D wrote: Reason for CRM: Pt called saying he tore his toenail off last night and would like to see a specialist .  HE said he is a diabetic and he does not want it to get infected   CB#  [phone]

## 2020-05-22 NOTE — Telephone Encounter (Signed)
Done

## 2020-05-25 DIAGNOSIS — R338 Other retention of urine: Secondary | ICD-10-CM | POA: Diagnosis not present

## 2020-05-25 DIAGNOSIS — D696 Thrombocytopenia, unspecified: Secondary | ICD-10-CM | POA: Diagnosis not present

## 2020-05-25 DIAGNOSIS — Z87891 Personal history of nicotine dependence: Secondary | ICD-10-CM | POA: Diagnosis not present

## 2020-05-25 DIAGNOSIS — Z9181 History of falling: Secondary | ICD-10-CM | POA: Diagnosis not present

## 2020-05-25 DIAGNOSIS — N200 Calculus of kidney: Secondary | ICD-10-CM | POA: Diagnosis not present

## 2020-05-25 DIAGNOSIS — M5116 Intervertebral disc disorders with radiculopathy, lumbar region: Secondary | ICD-10-CM | POA: Diagnosis not present

## 2020-05-25 DIAGNOSIS — E1142 Type 2 diabetes mellitus with diabetic polyneuropathy: Secondary | ICD-10-CM | POA: Diagnosis not present

## 2020-05-25 DIAGNOSIS — N183 Chronic kidney disease, stage 3 unspecified: Secondary | ICD-10-CM | POA: Diagnosis not present

## 2020-05-25 DIAGNOSIS — E785 Hyperlipidemia, unspecified: Secondary | ICD-10-CM | POA: Diagnosis not present

## 2020-05-25 DIAGNOSIS — I129 Hypertensive chronic kidney disease with stage 1 through stage 4 chronic kidney disease, or unspecified chronic kidney disease: Secondary | ICD-10-CM | POA: Diagnosis not present

## 2020-05-25 DIAGNOSIS — M792 Neuralgia and neuritis, unspecified: Secondary | ICD-10-CM | POA: Diagnosis not present

## 2020-05-25 DIAGNOSIS — Z794 Long term (current) use of insulin: Secondary | ICD-10-CM | POA: Diagnosis not present

## 2020-05-25 DIAGNOSIS — K760 Fatty (change of) liver, not elsewhere classified: Secondary | ICD-10-CM | POA: Diagnosis not present

## 2020-05-25 DIAGNOSIS — K219 Gastro-esophageal reflux disease without esophagitis: Secondary | ICD-10-CM | POA: Diagnosis not present

## 2020-05-25 DIAGNOSIS — E1122 Type 2 diabetes mellitus with diabetic chronic kidney disease: Secondary | ICD-10-CM | POA: Diagnosis not present

## 2020-05-25 DIAGNOSIS — M19019 Primary osteoarthritis, unspecified shoulder: Secondary | ICD-10-CM | POA: Diagnosis not present

## 2020-05-29 ENCOUNTER — Other Ambulatory Visit: Payer: Self-pay

## 2020-05-29 ENCOUNTER — Ambulatory Visit (INDEPENDENT_AMBULATORY_CARE_PROVIDER_SITE_OTHER): Payer: Medicare Other | Admitting: Podiatry

## 2020-05-29 DIAGNOSIS — L603 Nail dystrophy: Secondary | ICD-10-CM | POA: Diagnosis not present

## 2020-05-29 DIAGNOSIS — M79674 Pain in right toe(s): Secondary | ICD-10-CM | POA: Diagnosis not present

## 2020-05-29 DIAGNOSIS — B351 Tinea unguium: Secondary | ICD-10-CM

## 2020-05-29 DIAGNOSIS — M79675 Pain in left toe(s): Secondary | ICD-10-CM | POA: Diagnosis not present

## 2020-05-29 MED ORDER — MUPIROCIN 2 % EX OINT: 1.0000 "application " | TOPICAL_OINTMENT | Freq: Two times a day (BID) | CUTANEOUS | 2 refills | Status: DC

## 2020-05-29 MED ORDER — CICLOPIROX 8 % EX SOLN: Freq: Every day | CUTANEOUS | 2 refills | Status: DC

## 2020-05-29 NOTE — Patient Instructions (Signed)
To the left big toenail wash with soap and water daily. Dry very well. Apply a small amount of antibiotic ointment to the toe and cover with a bandage. Watch for any redness, drainage, swelling or any other signs of infection.  I have ordered Penlac to help with the toenail fungus.     Diabetes Mellitus and Foot Care Foot care is an important part of your health, especially when you have diabetes. Diabetes may cause you to have problems because of poor blood flow (circulation) to your feet and legs, which can cause your skin to:  Become thinner and drier.  Break more easily.  Heal more slowly.  Peel and crack. You may also have nerve damage (neuropathy) in your legs and feet, causing decreased feeling in them. This means that you may not notice minor injuries to your feet that could lead to more serious problems. Noticing and addressing any potential problems early is the best way to prevent future foot problems. How to care for your feet Foot hygiene  Wash your feet daily with warm water and mild soap. Do not use hot water. Then, pat your feet and the areas between your toes until they are completely dry. Do not soak your feet as this can dry your skin.  Trim your toenails straight across. Do not dig under them or around the cuticle. File the edges of your nails with an emery board or nail file.  Apply a moisturizing lotion or petroleum jelly to the skin on your feet and to dry, brittle toenails. Use lotion that does not contain alcohol and is unscented. Do not apply lotion between your toes.   Shoes and socks  Wear clean socks or stockings every day. Make sure they are not too tight. Do not wear knee-high stockings since they may decrease blood flow to your legs.  Wear shoes that fit properly and have enough cushioning. Always look in your shoes before you put them on to be sure there are no objects inside.  To break in new shoes, wear them for just a few hours a day. This prevents  injuries on your feet. Wounds, scrapes, corns, and calluses  Check your feet daily for blisters, cuts, bruises, sores, and redness. If you cannot see the bottom of your feet, use a mirror or ask someone for help.  Do not cut corns or calluses or try to remove them with medicine.  If you find a minor scrape, cut, or break in the skin on your feet, keep it and the skin around it clean and dry. You may clean these areas with mild soap and water. Do not clean the area with peroxide, alcohol, or iodine.  If you have a wound, scrape, corn, or callus on your foot, look at it several times a day to make sure it is healing and not infected. Check for: ? Redness, swelling, or pain. ? Fluid or blood. ? Warmth. ? Pus or a bad smell.   General tips  Do not cross your legs. This may decrease blood flow to your feet.  Do not use heating pads or hot water bottles on your feet. They may burn your skin. If you have lost feeling in your feet or legs, you may not know this is happening until it is too late.  Protect your feet from hot and cold by wearing shoes, such as at the beach or on hot pavement.  Schedule a complete foot exam at least once a year (annually) or more  often if you have foot problems. Report any cuts, sores, or bruises to your health care provider immediately. Where to find more information  American Diabetes Association: www.diabetes.org  Association of Diabetes Care & Education Specialists: www.diabeteseducator.org Contact a health care provider if:  You have a medical condition that increases your risk of infection and you have any cuts, sores, or bruises on your feet.  You have an injury that is not healing.  You have redness on your legs or feet.  You feel burning or tingling in your legs or feet.  You have pain or cramps in your legs and feet.  Your legs or feet are numb.  Your feet always feel cold.  You have pain around any toenails. Get help right away if:  You  have a wound, scrape, corn, or callus on your foot and: ? You have pain, swelling, or redness that gets worse. ? You have fluid or blood coming from the wound, scrape, corn, or callus. ? Your wound, scrape, corn, or callus feels warm to the touch. ? You have pus or a bad smell coming from the wound, scrape, corn, or callus. ? You have a fever. ? You have a red line going up your leg. Summary  Check your feet every day for blisters, cuts, bruises, sores, and redness.  Apply a moisturizing lotion or petroleum jelly to the skin on your feet and to dry, brittle toenails.  Wear shoes that fit properly and have enough cushioning.  If you have foot problems, report any cuts, sores, or bruises to your health care provider immediately.  Schedule a complete foot exam at least once a year (annually) or more often if you have foot problems. This information is not intended to replace advice given to you by your health care provider. Make sure you discuss any questions you have with your health care provider. Document Revised: 10/27/2019 Document Reviewed: 10/27/2019 Elsevier Patient Education  2021 [place].   Ciclopirox nail solution What is this medicine? CICLOPIROX (sye kloe PEER ox) NAIL SOLUTION is an antifungal medicine. It used to treat fungal infections of the nails. This medicine may be used for other purposes; ask your health care provider or pharmacist if you have questions. COMMON BRAND NAME(S): [id number], Penlac What should I tell my health care provider before I take this medicine? They need to know if you have any of these conditions:  diabetes mellitus  history of seizures  HIV infection  immune system problems or organ transplant  large areas of burned or damaged skin  peripheral vascular disease or poor circulation  taking corticosteroid medication (including steroid inhalers, cream, or lotion)  an unusual or allergic reaction to ciclopirox, isopropyl alcohol, other  medicines, foods, dyes, or preservatives  pregnant or trying to get pregnant  breast-feeding How should I use this medicine? This medicine is for external use only. Follow the directions that come with this medicine exactly. Wash and dry your hands before use. Avoid contact with the eyes, mouth or nose. If you do get this medicine in your eyes, rinse out with plenty of cool tap water. Contact your doctor or health care professional if eye irritation occurs. Use at regular intervals. Do not use your medicine more often than directed. Finish the full course prescribed by your doctor or health care professional even if you think you are better. Do not stop using except on your doctor's advice. Talk to your pediatrician regarding the use of this medicine in children. While this  medicine may be prescribed for children as young as 12 years for selected conditions, precautions do apply. Overdosage: If you think you have taken too much of this medicine contact a poison control center or emergency room at once. NOTE: This medicine is only for you. Do not share this medicine with others. What if I miss a dose? If you miss a dose, use it as soon as you can. If it is almost time for your next dose, use only that dose. Do not use double or extra doses. What may interact with this medicine? Interactions are not expected. Do not use any other skin products without telling your doctor or health care professional. This list may not describe all possible interactions. Give your health care provider a list of all the medicines, herbs, non-prescription drugs, or dietary supplements you use. Also tell them if you smoke, drink alcohol, or use illegal drugs. Some items may interact with your medicine. What should I watch for while using this medicine? Tell your doctor or health care professional if your symptoms get worse. Four to six months of treatment may be needed for the nail(s) to improve. Some people may not achieve  a complete cure or clearing of the nails by this time. Tell your doctor or health care professional if you develop sores or blisters that do not heal properly. If your nail infection returns after stopping using this product, contact your doctor or health care professional. What side effects may I notice from receiving this medicine? Side effects that you should report to your doctor or health care professional as soon as possible:  allergic reactions like skin rash, itching or hives, swelling of the face, lips, or tongue  severe irritation, redness, burning, blistering, peeling, swelling, oozing Side effects that usually do not require medical attention (report to your doctor or health care professional if they continue or are bothersome):  mild reddening of the skin  nail discoloration  temporary burning or mild stinging at the site of application This list may not describe all possible side effects. Call your doctor for medical advice about side effects. You may report side effects to FDA at 1-800-FDA-1088. Where should I keep my medicine? Keep out of the reach of children. Store at room temperature between 15 and 30 degrees C (59 and 86 degrees F). Do not freeze. Protect from light by storing the bottle in the carton after every use. This medicine is flammable. Keep away from heat and flame. Throw away any unused medicine after the expiration date. NOTE: This sheet is a summary. It may not cover all possible information. If you have questions about this medicine, talk to your doctor, pharmacist, or health care provider.  2021 Elsevier/Gold Standard (2007-07-12 16:49:20)

## 2020-05-31 ENCOUNTER — Encounter: Payer: Self-pay | Admitting: Family Medicine

## 2020-06-01 ENCOUNTER — Other Ambulatory Visit: Payer: Self-pay | Admitting: Family Medicine

## 2020-06-01 DIAGNOSIS — N1832 Chronic kidney disease, stage 3b: Secondary | ICD-10-CM | POA: Diagnosis not present

## 2020-06-01 DIAGNOSIS — R338 Other retention of urine: Secondary | ICD-10-CM

## 2020-06-01 DIAGNOSIS — D509 Iron deficiency anemia, unspecified: Secondary | ICD-10-CM | POA: Diagnosis not present

## 2020-06-01 DIAGNOSIS — N401 Enlarged prostate with lower urinary tract symptoms: Secondary | ICD-10-CM

## 2020-06-01 MED ORDER — TADALAFIL 5 MG PO TABS: ORAL_TABLET | ORAL | 0 refills | Status: DC

## 2020-06-05 ENCOUNTER — Telehealth: Payer: Self-pay | Admitting: Family Medicine

## 2020-06-05 NOTE — Telephone Encounter (Signed)
Copied from [place] [phone] [phone]. Topic: Referral - Request for Referral >> Jun 05, 2020 10:34 AM Scherrie Gerlach wrote: Has patient seen PCP for this complaint? yes Pt states his blood sugars are all over the place and he would like to see a specialist to get them under control.  Pt  [name] at [facility]

## 2020-06-05 NOTE — Progress Notes (Signed)
Subjective:   Patient ID: Jonathan Meckel., male   DOB: 55 y.o.   MRN: [id number]   HPI 55 year old male presents the office today for concerns of toenail issues.  He states the left big toenail came off on its own on the right morning split.  He states that the nails are thickened discolored and he cannot trim them himself.  He states that he has a "fall risk" and he will hit his toes which causes the worsening of symptoms.  Currently no redness or drainage or any swelling to the toenail sites he reports.  He has no other concerns today.  He is diabetic his last A1c was 6.4 in May 11, 2020  Review of Systems  All other systems reviewed and are negative.  Past Medical History:  Diagnosis Date  . ADD (attention deficit disorder)   . Anxiety   . Arthritis    right hip  . Bipolar 1 disorder ([place])   . Blood in urine   . CKD (chronic kidney disease), stage III ([place])   . Creatinine elevation   . Dementia ([place])    "early onset" (08/04/2017)  . Depression    bipolar [facility]  . Diabetes mellitus without complication ([place])   . Family history of anesthesia complication    pt is unsure , but pt father may have been difficult to arouse   . HCAP (healthcare-associated pneumonia) 10/31/2012  . History of kidney stones   . Hypertension   . Hypogonadism male   . Liver fatty degeneration   . Microscopic hematuria    hereditary s/p Urology eval  . Neuromuscular disorder ([place])    feet neuropathy   . Osteoarthritis of right hip 11/28/2011   2012 2015 s/p THR Severe  Dr Novella Olive    . Pleural effusion 11/02/2012  . Pneumonia 10-2012  . Pneumonia, organism unspecified(486) 11/02/2012  . Polysubstance dependence, non-opioid, in remission ([place])    remote  . Primary osteoarthritis of left hip 05/22/2015  . PTSD (post-traumatic stress disorder)    SOCIAL ANXIETY DISORDER   . Schizoaffective disorder ([place])   . Substance abuse ([place])   . Suicide attempt by multiple drug overdose ([place])  01-02-2016   Grieving his cat's death 07/19/2015    Past Surgical History:  Procedure Laterality Date  . BACK SURGERY    . CLOSED REDUCTION METACARPAL WITH PERCUTANEOUS PINNING Right   . LUMBAR DISC SURGERY    . TONSILLECTOMY    . TOTAL HIP ARTHROPLASTY Right 08/16/2013   Procedure: TOTAL HIP ARTHROPLASTY ANTERIOR APPROACH;  Surgeon: Hessie Dibble, MD;  Location: [place] [facility];  Service: Orthopedics;  Laterality: Right;  . TOTAL HIP ARTHROPLASTY Left 05/22/2015   Procedure: TOTAL HIP ARTHROPLASTY ANTERIOR APPROACH;  Surgeon: Melrose Nakayama, MD;  Location: [place];  Service: Orthopedics;  Laterality: Left;     Current Outpatient Medications:  .  ciclopirox (PENLAC) 8 % solution, Apply topically at bedtime. Apply over nail and surrounding skin. Apply daily over previous coat. After seven (7) days, may remove with alcohol and continue cycle., Disp: 6.6 mL, Rfl: 2 .  mupirocin ointment (BACTROBAN) 2 %, Apply 1 application topically 2 (two) times daily., Disp: 30 g, Rfl: 2 .  alfuzosin (UROXATRAL) 10 MG 24 hr tablet, Take 10 mg by mouth at bedtime. , Disp: , Rfl:  .  ARIPiprazole (ABILIFY) 5 MG tablet, TAKE 1 TABLET BY MOUTH DAILY FOR DEPRESSION (Patient taking differently: Take 5 mg by mouth daily.), Disp: 30 tablet, Rfl: 0 .  atorvastatin (LIPITOR) 10 MG tablet, Take 1 tablet (10 mg total) by mouth at bedtime., Disp: 90 tablet, Rfl: 1 .  buPROPion (WELLBUTRIN XL) 300 MG 24 hr tablet, Take 1 tablet (300 mg total) by mouth daily., Disp: 30 tablet, Rfl: 0 .  busPIRone (BUSPAR) 15 MG tablet, Take 15 mg by mouth 2 (two) times daily. , Disp: , Rfl:  .  Continuous Blood Gluc Sensor (FREESTYLE LIBRE 14 DAY SENSOR) MISC, Use as directed, Disp: 2 each, Rfl: 4 .  divalproex (DEPAKOTE ER) 500 MG 24 hr tablet, Take 500 mg by mouth daily., Disp: , Rfl:  .  divalproex (DEPAKOTE) 500 MG DR tablet, Take 3 tablets (1,500 mg total) by mouth at bedtime., Disp: 90 tablet, Rfl: 0 .  fenofibrate (TRICOR) 48 MG tablet, Take 1  tablet (48 mg total) by mouth daily., Disp: 90 tablet, Rfl: 1 .  FLUARIX QUADRIVALENT 0.5 ML injection, , Disp: , Rfl:  .  fluticasone (FLONASE) 50 MCG/ACT nasal spray, Place 1 spray into both nostrils daily., Disp: 48 g, Rfl: 3 .  gabapentin (NEURONTIN) 300 MG capsule, Take 1 capsule (300 mg total) by mouth 2 (two) times daily., Disp: 180 capsule, Rfl: 0 .  glucose blood (ACCU-CHEK GUIDE) test strip, CHECK SUGAR FOUR TIMES DAILY, Disp: 100 each, Rfl: 12 .  insulin aspart (NOVOLOG FLEXPEN) 100 UNIT/ML FlexPen, Inject 0-12 Units into the skin 2 (two) times daily. per sliding scale, Disp: 30 mL, Rfl: 2 .  insulin detemir (LEVEMIR FLEXTOUCH) 100 UNIT/ML FlexPen, INJECT 45 UNITS INTO THE SKIN 2 TIMES DAILY., Disp: 90 mL, Rfl: 0 .  Insulin Pen Needle (B-D UF III MINI PEN NEEDLES) 31G X 5 MM MISC, USE FOUR TIMES DAILY, Disp: 120 each, Rfl: 12 .  linagliptin (TRADJENTA) 5 MG TABS tablet, TAKE 1 TABLET (5 MG TOTAL) BY MOUTH DAILY., Disp: 90 tablet, Rfl: 0 .  lisinopril (ZESTRIL) 5 MG tablet, Take 1 tablet (5 mg total) by mouth daily., Disp: 90 tablet, Rfl: 0 .  nicotine (NICODERM CQ) 14 mg/24hr patch, Place 1 patch (14 mg total) onto the skin daily. Then 7 mg/24 hr patch for 1 month, Disp: 28 patch, Rfl: 1 .  QUEtiapine (SEROQUEL) 400 MG tablet, Take 400 mg by mouth as needed. (Patient not taking: Reported on 03/21/2020), Disp: , Rfl:  .  tadalafil (CIALIS) 5 MG tablet, TAKE ONE TABLET BY MOUTH EVERY DAY FOR BPH, Disp: 90 tablet, Rfl: 0 .  Testosterone (ANDROGEL) 20.25 MG/1.25GM (1.62%) GEL, Apply 20.25 mg topically every morning. Apply topically under arm pit alternating with each application., Disp: 1.25 g, Rfl: 0 .  Testosterone 1.62 % GEL, , Disp: , Rfl:  .  tiZANidine (ZANAFLEX) 4 MG tablet, Take 1 tablet (4 mg total) by mouth every 8 (eight) hours as needed for muscle spasms., Disp: 90 tablet, Rfl: 2 .  traMADol (ULTRAM) 50 MG tablet, Take 1 tablet (50 mg total) by mouth at bedtime as needed (pain).,  Disp: 30 tablet, Rfl: 2 .  traZODone (DESYREL) 150 MG tablet, Take 1 tablet (150 mg total) by mouth at bedtime., Disp: 90 tablet, Rfl: 1  Allergies  Allergen Reactions  . Vicodin [Hydrocodone-Acetaminophen] Itching        Objective:  Physical Exam  General: NAD  Dermatological: Nails are hypertrophic, dystrophic, brittle, discolored, elongated 10. No surrounding redness or drainage. Tenderness nails 1-5 bilaterally.  Continue there is a small slit in the distal portion of the right hallux toenail.  The left side of nail started  to come in but there is no open lesion identified.  No open lesions or pre-ulcerative lesions are identified today.   Vascular: Dorsalis Pedis artery and Posterior Tibial artery pedal pulses are 2/4 bilateral with immedate capillary fill time. There is no pain with calf compression, swelling, warmth, erythema.   Neurologic: Sensation decreased with Semmes Weinstein monofilament  Musculoskeletal:Muscular strength 5/5 in all groups tested bilateral.  Assessment:   Symptomatic onychomycosis, onychodystrophy     Plan:  -Treatment options discussed including all alternatives, risks, and complications -Etiology of symptoms were discussed -Nails debrided 10 without complications or bleeding. -Discussed treatment options for nail fungus he was to try to treat the nail fungus.  Prescribed Penlac and discussed side effects, success rates as well as application instructions. -Daily foot inspection -Follow-up in 3 months or sooner if any problems arise. In the meantime, encouraged to call the office with any questions, concerns, change in symptoms.   Celesta Gentile, DPM

## 2020-06-05 NOTE — Telephone Encounter (Signed)
Requesting referral to endocrinology for his diabetes.

## 2020-06-07 ENCOUNTER — Ambulatory Visit: Payer: Self-pay | Admitting: Family Medicine

## 2020-06-07 DIAGNOSIS — I129 Hypertensive chronic kidney disease with stage 1 through stage 4 chronic kidney disease, or unspecified chronic kidney disease: Secondary | ICD-10-CM | POA: Diagnosis not present

## 2020-06-07 DIAGNOSIS — Z794 Long term (current) use of insulin: Secondary | ICD-10-CM

## 2020-06-07 DIAGNOSIS — E1140 Type 2 diabetes mellitus with diabetic neuropathy, unspecified: Secondary | ICD-10-CM | POA: Diagnosis not present

## 2020-06-07 DIAGNOSIS — D631 Anemia in chronic kidney disease: Secondary | ICD-10-CM | POA: Diagnosis not present

## 2020-06-07 DIAGNOSIS — N1831 Chronic kidney disease, stage 3a: Secondary | ICD-10-CM

## 2020-06-07 DIAGNOSIS — N1832 Chronic kidney disease, stage 3b: Secondary | ICD-10-CM | POA: Diagnosis not present

## 2020-06-07 NOTE — Telephone Encounter (Signed)
Pt following up on referral request. Requested referral to endocrinologist. States BS "All over the place for weeks now." Reports 50s in AM, after eating spikes to high 100s to 200, "Then drops again."  Reports lightheaded and increased fatigue when BS low. States when drops "I drink chocolate milk and hard candy." Is on insulin, Novolog and levemir and oral agent tradjenta. BS this am 68, prior to call 193. Please advise: CB# [phone]. Pt requests detailed message if he does not answer call. CAre advise given per protocol; verbalizes understanding.  Reason for Disposition . [1] Morning (before breakfast) blood glucose < 80 mg/dL (4.4 mmol/L) AND [2] more than once in past week  Answer Assessment - Initial Assessment Questions 1. SYMPTOMS: "What symptoms are you concerned about?"    "BS all over the place" 2. ONSET:  "When did the symptoms start?"     "For weeks" 3. BLOOD GLUCOSE: "What is your blood glucose level?"     99 4. USUAL RANGE: "What is your blood glucose level usually?" (e.g., usual fasting morning value, usual evening value)     50's in morning 5. TYPE 1 or 2:  "Do you know what type of diabetes you have?"  (e.g., Type 1, Type 2, Gestational; doesn't know)       6. INSULIN: "Do you take insulin?" "What type of insulin(s) do you use? What is the mode of delivery? (syringe, pen; injection or pump) "When did you last give yourself an insulin dose?" (i.e., time or hours/minutes ago) "How much did you give?" (i.e., how many units)     Yes 7. DIABETES PILLS: "Do you take any pills for your diabetes?"     Yes 8. OTHER SYMPTOMS: "Do you have any symptoms?" (e.g., fever, frequent urination, difficulty breathing, vomiting)     Fatigue and lightheaded when it drops, not presently. 9. LOW BLOOD GLUCOSE TREATMENT: "What have you done so far to treat the low blood glucose level?"     "I eat then it gets high." 10. FOOD: "When did you last eat or drink?"       This AM. 11. ALONE: "Are  you alone right now or is someone with you?"        With aide presently. Appt at 1300 with "Kidney doctor."  Protocols used: DIABETES - LOW BLOOD SUGAR-A-AH

## 2020-06-07 NOTE — Addendum Note (Signed)
Addended by: [name] on: 06/07/2020 05:14 PM   Modules accepted: Orders

## 2020-06-07 NOTE — Telephone Encounter (Signed)
Pt is requesting referral to endocrinology.

## 2020-06-07 NOTE — Telephone Encounter (Signed)
Advise to decrease Levemir by 2 units in the morning and by 2 units in the evening. I have placed referral.

## 2020-06-08 ENCOUNTER — Telehealth: Payer: Self-pay | Admitting: Family Medicine

## 2020-06-08 NOTE — Telephone Encounter (Signed)
Pt was called and informed of referral being placed and to decrease insulin.

## 2020-06-08 NOTE — Telephone Encounter (Signed)
[name] with [facility] is calling to check on orders(05/01/20) that were faxed 05/03/19, and 06/07/20  for OT, to train to DME safety [phone]

## 2020-06-08 NOTE — Telephone Encounter (Signed)
I signed my orders today

## 2020-06-11 ENCOUNTER — Telehealth: Payer: Self-pay | Admitting: Family Medicine

## 2020-06-11 NOTE — Telephone Encounter (Signed)
Pt is requesting medication for heartburn

## 2020-06-11 NOTE — Telephone Encounter (Signed)
Pt called to ask if Dr. Margarita Rana would send in a refill for his Heartburn medication/ He couldn't remember the name of it/ Pt states he asked to come off of it but he has hac heartburn for the last week and would like to start back on the medication / please advise

## 2020-06-11 NOTE — Telephone Encounter (Signed)
Paperwork has been received and will be faxed once complete.

## 2020-06-12 ENCOUNTER — Other Ambulatory Visit: Payer: Self-pay

## 2020-06-12 MED ORDER — PANTOPRAZOLE SODIUM 40 MG PO TBEC: 40.0000 mg | DELAYED_RELEASE_TABLET | Freq: Every day | ORAL | 3 refills | Status: DC

## 2020-06-12 NOTE — Telephone Encounter (Signed)
Done

## 2020-06-13 NOTE — Telephone Encounter (Signed)
[name] with [facility] is calling to check on orders- please fax to her direct fax number [phone].

## 2020-06-13 NOTE — Telephone Encounter (Signed)
Fax number is currently busy. Will try and re fax.

## 2020-06-14 NOTE — Telephone Encounter (Signed)
[name] was called and fax has been received.

## 2020-07-04 ENCOUNTER — Telehealth: Payer: Self-pay | Admitting: Family Medicine

## 2020-07-04 DIAGNOSIS — E1122 Type 2 diabetes mellitus with diabetic chronic kidney disease: Secondary | ICD-10-CM

## 2020-07-04 DIAGNOSIS — F250 Schizoaffective disorder, bipolar type: Secondary | ICD-10-CM

## 2020-07-04 DIAGNOSIS — Z794 Long term (current) use of insulin: Secondary | ICD-10-CM

## 2020-07-04 DIAGNOSIS — E349 Endocrine disorder, unspecified: Secondary | ICD-10-CM

## 2020-07-04 NOTE — Telephone Encounter (Signed)
Called patient to re-schedule physical on Monday 07/09/20 due to [facility] only seeing virtual patients. Patient stated his glucose has been really high, sometimes in the 500s, and he has been waiting for this visit for months.   I advised him she was only seeing virtual and offered mobile. Patient stated he has a visit with endocrinology on Tuesday and wanted to talk to Dr. Margarita Rana prior to that. Patient would like LFT, lipids, depakote levels, testosterone, A1C, CBC checked. Patient states he wants a full panel of labs drawn so that everything can be checked. Patient would like these drawn prior to his visit with [facility] maybe tomorrow or Friday so that he can discuss labs with [facility] at [facility] visit.   Could patient get labs tomorrow or Friday? Please advise and follow up with patient regarding lab appointment or if orders are placed I can reach out to schedule patient.

## 2020-07-04 NOTE — Telephone Encounter (Signed)
Will route to PCP for review. 

## 2020-07-05 NOTE — Telephone Encounter (Signed)
Labs have been ordered

## 2020-07-05 NOTE — Telephone Encounter (Signed)
Pt has been called and set a lab appointment for 07/06/20

## 2020-07-06 ENCOUNTER — Other Ambulatory Visit: Payer: Self-pay

## 2020-07-06 ENCOUNTER — Ambulatory Visit: Payer: Medicare Other | Attending: Family Medicine

## 2020-07-06 ENCOUNTER — Other Ambulatory Visit: Payer: Medicare Other

## 2020-07-06 DIAGNOSIS — E1122 Type 2 diabetes mellitus with diabetic chronic kidney disease: Secondary | ICD-10-CM

## 2020-07-06 DIAGNOSIS — F250 Schizoaffective disorder, bipolar type: Secondary | ICD-10-CM

## 2020-07-06 DIAGNOSIS — E349 Endocrine disorder, unspecified: Secondary | ICD-10-CM | POA: Diagnosis not present

## 2020-07-06 DIAGNOSIS — Z794 Long term (current) use of insulin: Secondary | ICD-10-CM | POA: Diagnosis not present

## 2020-07-06 DIAGNOSIS — N1831 Chronic kidney disease, stage 3a: Secondary | ICD-10-CM | POA: Diagnosis not present

## 2020-07-09 ENCOUNTER — Ambulatory Visit: Payer: Medicare Other | Attending: Family Medicine | Admitting: Family Medicine

## 2020-07-09 ENCOUNTER — Other Ambulatory Visit: Payer: Self-pay

## 2020-07-09 DIAGNOSIS — E349 Endocrine disorder, unspecified: Secondary | ICD-10-CM | POA: Diagnosis not present

## 2020-07-09 DIAGNOSIS — E781 Pure hyperglyceridemia: Secondary | ICD-10-CM | POA: Diagnosis not present

## 2020-07-09 DIAGNOSIS — J302 Other seasonal allergic rhinitis: Secondary | ICD-10-CM | POA: Diagnosis not present

## 2020-07-09 DIAGNOSIS — R338 Other retention of urine: Secondary | ICD-10-CM

## 2020-07-09 DIAGNOSIS — E1122 Type 2 diabetes mellitus with diabetic chronic kidney disease: Secondary | ICD-10-CM

## 2020-07-09 DIAGNOSIS — M5136 Other intervertebral disc degeneration, lumbar region: Secondary | ICD-10-CM

## 2020-07-09 DIAGNOSIS — N1831 Chronic kidney disease, stage 3a: Secondary | ICD-10-CM

## 2020-07-09 DIAGNOSIS — Z794 Long term (current) use of insulin: Secondary | ICD-10-CM

## 2020-07-09 DIAGNOSIS — I152 Hypertension secondary to endocrine disorders: Secondary | ICD-10-CM | POA: Diagnosis not present

## 2020-07-09 DIAGNOSIS — E1159 Type 2 diabetes mellitus with other circulatory complications: Secondary | ICD-10-CM | POA: Diagnosis not present

## 2020-07-09 DIAGNOSIS — N401 Enlarged prostate with lower urinary tract symptoms: Secondary | ICD-10-CM

## 2020-07-09 LAB — CMP14+EGFR
ALT: 16 IU/L (ref 0–44)
AST: 23 IU/L (ref 0–40)
Albumin/Globulin Ratio: 1.6 (ref 1.2–2.2)
Albumin: 4.6 g/dL (ref 3.8–4.9)
Alkaline Phosphatase: 85 IU/L (ref 44–121)
BUN/Creatinine Ratio: 13 (ref 9–20)
BUN: 26 mg/dL — ABNORMAL HIGH (ref 6–24)
Bilirubin Total: 0.4 mg/dL (ref 0.0–1.2)
CO2: 21 mmol/L (ref 20–29)
Calcium: 10.3 mg/dL — ABNORMAL HIGH (ref 8.7–10.2)
Chloride: 94 mmol/L — ABNORMAL LOW (ref 96–106)
Creatinine, Ser: 1.97 mg/dL — ABNORMAL HIGH (ref 0.76–1.27)
Globulin, Total: 2.9 g/dL (ref 1.5–4.5)
Glucose: 229 mg/dL — ABNORMAL HIGH (ref 65–99)
Potassium: 4.9 mmol/L (ref 3.5–5.2)
Sodium: 136 mmol/L (ref 134–144)
Total Protein: 7.5 g/dL (ref 6.0–8.5)
eGFR: 40 mL/min/{1.73_m2} — ABNORMAL LOW (ref >59)

## 2020-07-09 LAB — CBC WITH DIFFERENTIAL/PLATELET
Basophils Absolute: 0 10*3/uL (ref 0.0–0.2)
Basos: 0 %
EOS (ABSOLUTE): 0 10*3/uL (ref 0.0–0.4)
Eos: 0 %
Hematocrit: 47.8 % (ref 37.5–51.0)
Hemoglobin: 16.7 g/dL (ref 13.0–17.7)
Immature Grans (Abs): 0 10*3/uL (ref 0.0–0.1)
Immature Granulocytes: 0 %
Lymphocytes Absolute: 1.2 10*3/uL (ref 0.7–3.1)
Lymphs: 14 %
MCH: 30.9 pg (ref 26.6–33.0)
MCHC: 34.9 g/dL (ref 31.5–35.7)
MCV: 89 fL (ref 79–97)
Monocytes Absolute: 0.7 10*3/uL (ref 0.1–0.9)
Monocytes: 9 %
Neutrophils Absolute: 6.4 10*3/uL (ref 1.4–7.0)
Neutrophils: 77 %
Platelets: 175 10*3/uL (ref 150–450)
RBC: 5.4 x10E6/uL (ref 4.14–5.80)
RDW: 14.1 % (ref 11.6–15.4)
WBC: 8.4 10*3/uL (ref 3.4–10.8)

## 2020-07-09 LAB — VALPROIC ACID LEVEL: Valproic Acid Lvl: 41 ug/mL — ABNORMAL LOW (ref 50–100)

## 2020-07-09 LAB — MICROALBUMIN / CREATININE URINE RATIO
Creatinine, Urine: 79.6 mg/dL
Microalb/Creat Ratio: 75 mg/g creat — ABNORMAL HIGH (ref 0–29)
Microalbumin, Urine: 60 ug/mL

## 2020-07-09 LAB — HEMOGLOBIN A1C
Est. average glucose Bld gHb Est-mCnc: 169 mg/dL
Hgb A1c MFr Bld: 7.5 % — ABNORMAL HIGH (ref 4.8–5.6)

## 2020-07-09 LAB — LIPID PANEL
Chol/HDL Ratio: 4.3 ratio (ref 0.0–5.0)
Cholesterol, Total: 167 mg/dL (ref 100–199)
HDL: 39 mg/dL — ABNORMAL LOW (ref >39)
LDL Chol Calc (NIH): 76 mg/dL (ref 0–99)
Triglycerides: 323 mg/dL — ABNORMAL HIGH (ref 0–149)
VLDL Cholesterol Cal: 52 mg/dL — ABNORMAL HIGH (ref 5–40)

## 2020-07-09 LAB — TESTOSTERONE, FREE, TOTAL, SHBG
Sex Hormone Binding: 18 nmol/L — ABNORMAL LOW (ref 19.3–76.4)
Testosterone, Free: 6.5 pg/mL — ABNORMAL LOW (ref 7.2–24.0)
Testosterone: 87 ng/dL — ABNORMAL LOW (ref 264–916)

## 2020-07-09 MED ORDER — FLUTICASONE PROPIONATE 50 MCG/ACT NA SUSP: 1.0000 | Freq: Every day | NASAL | 3 refills | Status: DC

## 2020-07-09 MED ORDER — LISINOPRIL 5 MG PO TABS: 5.0000 mg | ORAL_TABLET | Freq: Every day | ORAL | 1 refills | Status: DC

## 2020-07-09 MED ORDER — PANTOPRAZOLE SODIUM 40 MG PO TBEC: 40.0000 mg | DELAYED_RELEASE_TABLET | Freq: Every day | ORAL | 1 refills | Status: DC

## 2020-07-09 MED ORDER — ATORVASTATIN CALCIUM 10 MG PO TABS: 10.0000 mg | ORAL_TABLET | Freq: Every day | ORAL | 1 refills | Status: DC

## 2020-07-09 MED ORDER — TRAMADOL HCL 50 MG PO TABS: 50.0000 mg | ORAL_TABLET | Freq: Every evening | ORAL | 2 refills | Status: DC | PRN

## 2020-07-09 MED ORDER — TESTOSTERONE 20.25 MG/1.25GM (1.62%) TD GEL: 20.2500 mg | Freq: Every morning | TRANSDERMAL | 5 refills | Status: DC

## 2020-07-09 MED ORDER — GABAPENTIN 300 MG PO CAPS
300.0000 mg | ORAL_CAPSULE | Freq: Two times a day (BID) | ORAL | 1 refills | Status: DC
Start: 2020-07-09 — End: 2020-11-19

## 2020-07-09 MED ORDER — LEVEMIR FLEXTOUCH 100 UNIT/ML ~~LOC~~ SOPN
PEN_INJECTOR | SUBCUTANEOUS | 0 refills | Status: DC
Start: 2020-07-09 — End: 2020-08-29

## 2020-07-09 MED ORDER — TIZANIDINE HCL 4 MG PO TABS: 4.0000 mg | ORAL_TABLET | Freq: Three times a day (TID) | ORAL | 2 refills | Status: DC | PRN

## 2020-07-09 MED ORDER — TADALAFIL 5 MG PO TABS: ORAL_TABLET | ORAL | 1 refills | Status: DC

## 2020-07-09 MED ORDER — FENOFIBRATE 145 MG PO TABS: 145.0000 mg | ORAL_TABLET | Freq: Every day | ORAL | 1 refills | Status: DC

## 2020-07-09 NOTE — Progress Notes (Signed)
Virtual Visit via Telephone Note  I connected with [name]., on 07/09/2020 at 2:54 PM by telephone due to the COVID-19 pandemic and verified that I am speaking with the correct person using two identifiers.   Consent: I discussed the limitations, risks, security and privacy concerns of performing an evaluation and management service by telephone and the availability of in person appointments. I also discussed with the patient that there may be a patient responsible charge related to this service. The patient expressed understanding and agreed to proceed.   Location of Patient: Home  Location of Provider: Clinic   Persons participating in Telemedicine visit: [name]. Dr. Margarita Rana     History of Present Illness: Jonathan Asch. is a 55 year old male with a history of type 2 diabetes mellitus (A1c7.5), bipolar disorder, degenerative disease of the lumbar spine with associated radiculopathy, insomnia, memory loss who presents today for a follow-up   States his sugars have been up to 250, 300, 500 and this has been going on for the last couple of weeks. He was scheduled to see Endocrine tomorow but his appointment was rescheduled Today he woke up with a fasting at 153, 251 random today. Fasting yesterday was 208  States his diet has not changed Currently on Levemir 47 units bid and also uses NovoLog sliding scale.  Followed by urology for his BPH but not followed by nephrology.  His most recent labs revealed a bump in creatinine from 1.48-1.97 in 6 months.  He denies NSAID use. Bipolar disorder is managed by psychiatry. He remains on testosterone replacement therapy but his last testosterone level was decreased.  He endorses being out of his testosterone prior to his labs. Past Medical History:  Diagnosis Date  . ADD (attention deficit disorder)   . Anxiety   . Arthritis    right hip  . Bipolar 1 disorder ([place])   . Blood in urine   . CKD  (chronic kidney disease), stage III ([place])   . Creatinine elevation   . Dementia ([place])    "early onset" (08/04/2017)  . Depression    bipolar [facility]  . Diabetes mellitus without complication ([place])   . Family history of anesthesia complication    pt is unsure , but pt father may have been difficult to arouse   . HCAP (healthcare-associated pneumonia) 10/31/2012  . History of kidney stones   . Hypertension   . Hypogonadism male   . Liver fatty degeneration   . Microscopic hematuria    hereditary s/p Urology eval  . Neuromuscular disorder ([place])    feet neuropathy   . Osteoarthritis of right hip 11/28/2011   2012 2015 s/p THR Severe  Dr Novella Olive    . Pleural effusion 11/02/2012  . Pneumonia 10-2012  . Pneumonia, organism unspecified([place]) 11/02/2012  . Polysubstance dependence, non-opioid, in remission ([place])    remote  . Primary osteoarthritis of left hip 05/22/2015  . PTSD (post-traumatic stress disorder)    SOCIAL ANXIETY DISORDER   . Schizoaffective disorder ([place])   . Substance abuse ([place])   . Suicide attempt by multiple drug overdose ([place]) January 06, 2016   Grieving his cat's death 07-23-15   Allergies  Allergen Reactions  . Vicodin [Hydrocodone-Acetaminophen] Itching    Current Outpatient Medications on File Prior to Visit  Medication Sig Dispense Refill  . alfuzosin (UROXATRAL) 10 MG 24 hr tablet Take 10 mg by mouth at bedtime.     . ARIPiprazole (ABILIFY) 5 MG tablet  TAKE 1 TABLET BY MOUTH DAILY FOR DEPRESSION (Patient taking differently: Take 5 mg by mouth daily.) 30 tablet 0  . atorvastatin (LIPITOR) 10 MG tablet Take 1 tablet (10 mg total) by mouth at bedtime. 90 tablet 1  . buPROPion (WELLBUTRIN XL) 300 MG 24 hr tablet Take 1 tablet (300 mg total) by mouth daily. 30 tablet 0  . busPIRone (BUSPAR) 15 MG tablet Take 15 mg by mouth 2 (two) times daily.     . ciclopirox (PENLAC) 8 % solution Apply topically at bedtime. Apply over nail and surrounding skin. Apply daily over  previous coat. After seven (7) days, may remove with alcohol and continue cycle. 6.6 mL 2  . Continuous Blood Gluc Sensor (FREESTYLE LIBRE 14 DAY SENSOR) MISC Use as directed 2 each 4  . divalproex (DEPAKOTE ER) 500 MG 24 hr tablet Take 500 mg by mouth daily.    . divalproex (DEPAKOTE) 500 MG DR tablet Take 3 tablets (1,500 mg total) by mouth at bedtime. 90 tablet 0  . fenofibrate (TRICOR) 48 MG tablet Take 1 tablet (48 mg total) by mouth daily. 90 tablet 1  . FLUARIX QUADRIVALENT 0.5 ML injection     . fluticasone (FLONASE) 50 MCG/ACT nasal spray Place 1 spray into both nostrils daily. 48 g 3  . gabapentin (NEURONTIN) 300 MG capsule Take 1 capsule (300 mg total) by mouth 2 (two) times daily. 180 capsule 0  . glucose blood (ACCU-CHEK GUIDE) test strip CHECK SUGAR FOUR TIMES DAILY 100 each 12  . insulin aspart (NOVOLOG FLEXPEN) 100 UNIT/ML FlexPen Inject 0-12 Units into the skin 2 (two) times daily. per sliding scale 30 mL 2  . insulin detemir (LEVEMIR FLEXTOUCH) 100 UNIT/ML FlexPen INJECT 45 UNITS INTO THE SKIN 2 TIMES DAILY. 90 mL 0  . Insulin Pen Needle (B-D UF III MINI PEN NEEDLES) 31G X 5 MM MISC USE FOUR TIMES DAILY 120 each 12  . linagliptin (TRADJENTA) 5 MG TABS tablet TAKE 1 TABLET (5 MG TOTAL) BY MOUTH DAILY. 90 tablet 0  . lisinopril (ZESTRIL) 5 MG tablet Take 1 tablet (5 mg total) by mouth daily. 90 tablet 0  . mupirocin ointment (BACTROBAN) 2 % Apply 1 application topically 2 (two) times daily. 30 g 2  . nicotine (NICODERM CQ) 14 mg/24hr patch Place 1 patch (14 mg total) onto the skin daily. Then 7 mg/24 hr patch for 1 month 28 patch 1  . pantoprazole (PROTONIX) 40 MG tablet Take 1 tablet (40 mg total) by mouth daily. 30 tablet 3  . QUEtiapine (SEROQUEL) 400 MG tablet Take 400 mg by mouth as needed. (Patient not taking: Reported on 03/21/2020)    . tadalafil (CIALIS) 5 MG tablet TAKE ONE TABLET BY MOUTH EVERY DAY FOR BPH 90 tablet 0  . Testosterone (ANDROGEL) 20.25 MG/1.25GM (1.62%) GEL  Apply 20.25 mg topically every morning. Apply topically under arm pit alternating with each application. 1.25 g 0  . Testosterone 1.62 % GEL     . tiZANidine (ZANAFLEX) 4 MG tablet Take 1 tablet (4 mg total) by mouth every 8 (eight) hours as needed for muscle spasms. 90 tablet 2  . traMADol (ULTRAM) 50 MG tablet Take 1 tablet (50 mg total) by mouth at bedtime as needed (pain). 30 tablet 2  . traZODone (DESYREL) 150 MG tablet Take 1 tablet (150 mg total) by mouth at bedtime. 90 tablet 1   No current facility-administered medications on file prior to visit.    ROS: See HPI  Observations/Objective:  Awake, alert, oriented x3 Not in acute distress  Lab Results  Component Value Date   HGBA1C 7.5 (H) 07/06/2020    CMP Latest Ref Rng & Units 07/06/2020 01/04/2020 01/03/2020  Glucose 65 - 99 mg/dL 229(H) 210(H) 147(H)  BUN 6 - 24 mg/dL 26(H) 27(H) 31(H)  Creatinine 0.76 - 1.27 mg/dL 1.97(H) 1.48(H) 1.43(H)  Sodium 134 - 144 mmol/L 136 134(L) 138  Potassium 3.5 - 5.2 mmol/L 4.9 3.9 4.7  Chloride 96 - 106 mmol/L 94(L) 100 105  CO2 20 - 29 mmol/L 21 26 25   Calcium 8.7 - 10.2 mg/dL 10.3(H) 9.1 9.1  Total Protein 6.0 - 8.5 g/dL 7.5 - -  Total Bilirubin 0.0 - 1.2 mg/dL 0.4 - -  Alkaline Phos 44 - 121 IU/L 85 - -  AST 0 - 40 IU/L 23 - -  ALT 0 - 44 IU/L 16 - -    Lipid Panel     Component Value Date/Time   CHOL 167 07/06/2020 1116   TRIG 323 (H) 07/06/2020 1116   HDL 39 (L) 07/06/2020 1116   CHOLHDL 4.3 07/06/2020 1116   CHOLHDL 7 06/02/2016 0844   VLDL 41 (H) 10/03/2015 0434   LDLCALC 76 07/06/2020 1116   LDLDIRECT 72.0 06/02/2016 0844   LABVLDL 52 (H) 07/06/2020 1116     Assessment and Plan: 1. Type 2 diabetes mellitus with stage 3a chronic kidney disease, with long-term current use of insulin (HCC) Suboptimally controlled with A1c of 7.5; goal is less than 7.0 Based on current elevation in blood sugars Levemir has been increased from 47 units to 50 units twice daily He will  continue with NovoLog Appointment with endocrinology would need to be rescheduled Refer to Nephrology given acute on chronic kidney injury and advised to avoid NSAIDs Counseled on Diabetic diet, my plate method, 024 minutes of moderate intensity exercise/week Blood sugar logs with fasting goals of 80-120 mg/dl, random of less than 180 and in the event of sugars less than 60 mg/dl or greater than 400 mg/dl encouraged to notify the clinic. Advised on the need for annual eye exams, annual foot exams, Pneumonia vaccine. - insulin detemir (LEVEMIR FLEXTOUCH) 100 UNIT/ML FlexPen; INJECT 50 UNITS INTO THE SKIN 2 TIMES DAILY.  Dispense: 90 mL; Refill: 0 - atorvastatin (LIPITOR) 10 MG tablet; Take 1 tablet (10 mg total) by mouth at bedtime.  Dispense: 90 tablet; Refill: 1 - Ambulatory referral to Nephrology  2. Seasonal allergies Stable - fluticasone (FLONASE) 50 MCG/ACT nasal spray; Place 1 spray into both nostrils daily.  Dispense: 48 g; Refill: 3  3. Degenerative disc disease, lumbar Stable - gabapentin (NEURONTIN) 300 MG capsule; Take 1 capsule (300 mg total) by mouth 2 (two) times daily.  Dispense: 180 capsule; Refill: 1 - traMADol (ULTRAM) 50 MG tablet; Take 1 tablet (50 mg total) by mouth at bedtime as needed (pain).  Dispense: 30 tablet; Refill: 2 - tiZANidine (ZANAFLEX) 4 MG tablet; Take 1 tablet (4 mg total) by mouth every 8 (eight) hours as needed for muscle spasms.  Dispense: 90 tablet; Refill: 2  4. Hypertension associated with diabetes ([place]) Controlled Counseled on blood pressure goal of less than 130/80, low-sodium, DASH diet, medication compliance, 150 minutes of moderate intensity exercise per week. Discussed medication compliance, adverse effects. - lisinopril (ZESTRIL) 5 MG tablet; Take 1 tablet (5 mg total) by mouth daily.  Dispense: 90 tablet; Refill: 1  5. Testosterone deficiency Uncontrolled We will make no regimen changes given previous levels were normal and he  was  recently out of his medication prior to labs being drawn - Testosterone (ANDROGEL) 20.25 MG/1.25GM (1.62%) GEL; Apply 20.25 mg topically every morning. Apply topically under arm pit alternating with each application.  Dispense: 1.25 g; Refill: 5  6. Benign prostatic hyperplasia with urinary retention Stable Followed by urology - tadalafil (CIALIS) 5 MG tablet; TAKE ONE TABLET BY MOUTH EVERY DAY FOR BPH  Dispense: 90 tablet; Refill: 1  7. Hypertriglyceridemia Uncontrolled Increase TriCor dose - fenofibrate (TRICOR) 145 MG tablet; Take 1 tablet (145 mg total) by mouth daily.  Dispense: 90 tablet; Refill: 1   Follow Up Instructions: 3 months   I discussed the assessment and treatment plan with the patient. The patient was provided an opportunity to ask questions and all were answered. The patient agreed with the plan and demonstrated an understanding of the instructions.   The patient was advised to call back or seek an in-person evaluation if the symptoms worsen or if the condition fails to improve as anticipated.     I provided 22 minutes total of non-face-to-face time during this encounter.   [name], MD, FAAFP. [facility] and [place], [place]   07/09/2020, 2:54 PM

## 2020-07-11 ENCOUNTER — Other Ambulatory Visit: Payer: Self-pay | Admitting: Family Medicine

## 2020-07-11 DIAGNOSIS — Z794 Long term (current) use of insulin: Secondary | ICD-10-CM

## 2020-07-11 DIAGNOSIS — N1831 Chronic kidney disease, stage 3a: Secondary | ICD-10-CM

## 2020-07-11 DIAGNOSIS — E1122 Type 2 diabetes mellitus with diabetic chronic kidney disease: Secondary | ICD-10-CM

## 2020-07-11 NOTE — Telephone Encounter (Signed)
Requested medications are due for refill today.  unknown  Requested medications are on the active medications list.  no  Last refill. unknown  Future visit scheduled.   yes  Notes to clinic. Not on med list - please advise.

## 2020-07-13 ENCOUNTER — Other Ambulatory Visit: Payer: Self-pay

## 2020-07-13 ENCOUNTER — Encounter: Payer: Self-pay | Admitting: Family Medicine

## 2020-07-13 DIAGNOSIS — Z794 Long term (current) use of insulin: Secondary | ICD-10-CM

## 2020-07-13 DIAGNOSIS — N1831 Chronic kidney disease, stage 3a: Secondary | ICD-10-CM

## 2020-07-13 MED ORDER — NOVOLOG FLEXPEN 100 UNIT/ML ~~LOC~~ SOPN: 0.0000 [IU] | PEN_INJECTOR | Freq: Two times a day (BID) | SUBCUTANEOUS | 2 refills | Status: DC

## 2020-07-17 ENCOUNTER — Telehealth: Payer: Self-pay | Admitting: Family Medicine

## 2020-07-17 ENCOUNTER — Ambulatory Visit: Payer: Medicare Other | Admitting: Endocrinology

## 2020-07-17 NOTE — Telephone Encounter (Signed)
[name] called saying the patient called them wanting to order the [employer].  They told him he may ned a special PA for that since they pay for the Other monitors.   Providers that supply that are [facility] , [facility].  Denzil Hughes  [phone]

## 2020-07-17 NOTE — Telephone Encounter (Signed)
Can you please assist the patient with this?  Thank you

## 2020-07-18 ENCOUNTER — Ambulatory Visit: Payer: Medicare Other | Admitting: Endocrinology

## 2020-07-18 ENCOUNTER — Other Ambulatory Visit: Payer: Self-pay | Admitting: Pharmacist

## 2020-07-18 NOTE — Telephone Encounter (Signed)
Spoke to patient and patient's insurance. [name] is covered but the prescriptions cannot be filled at [employer] [facility]. These must be sent by the PCP to a medical equipment provider. Pt is setting up an account with [facility]. Can you send the prescriptions?

## 2020-07-20 MED ORDER — FREESTYLE LIBRE 14 DAY SENSOR MISC: 4 refills | Status: DC

## 2020-07-20 MED ORDER — FREESTYLE LIBRE READER DEVI: Status: DC

## 2020-07-20 NOTE — Addendum Note (Signed)
Addended by: [name] on: 07/20/2020 10:46 AM   Modules accepted: Orders

## 2020-07-20 NOTE — Telephone Encounter (Signed)
I have printed out prescriptions.  Please fax to [facility].

## 2020-07-20 NOTE — Telephone Encounter (Signed)
Script has been faxed to [facility].

## 2020-07-27 ENCOUNTER — Telehealth: Payer: Self-pay | Admitting: Family Medicine

## 2020-07-27 ENCOUNTER — Other Ambulatory Visit: Payer: Self-pay | Admitting: Family Medicine

## 2020-07-27 DIAGNOSIS — Z794 Long term (current) use of insulin: Secondary | ICD-10-CM

## 2020-07-27 DIAGNOSIS — N1831 Chronic kidney disease, stage 3a: Secondary | ICD-10-CM

## 2020-07-27 MED ORDER — BD PEN NEEDLE MINI U/F 31G X 5 MM MISC: 12 refills | Status: DC

## 2020-07-27 NOTE — Telephone Encounter (Signed)
Medication Refill - Medication: Insulin Pen Needle (B-D UF III MINI PEN NEEDLES) 31G X 5 MM MISC   Has the patient contacted their pharmacy? Yes.   (Agent: If no, request that the patient contact the pharmacy for the refill.) (Agent: If yes, when and what did the pharmacy advise?)  Preferred Pharmacy (with phone number or street name):  [facility] [name], [place], [place] [address]  Phone: [phone] Fax: [phone]     Agent: Please be advised that RX refills may take up to 3 business days. We ask that you follow-up with your pharmacy.

## 2020-07-27 NOTE — Telephone Encounter (Signed)
The pts Rx for Continuous Blood Gluc Sensor (FREESTYLE LIBRE 14 DAY SENSOR) Went to [facility] but the pt stated this can not go to the pharmacy and and need to go to [facility] / it has to go through medical supply since it is through insurance / please resend Rx to [facility] asap

## 2020-07-29 ENCOUNTER — Encounter: Payer: Self-pay | Admitting: Family Medicine

## 2020-07-30 MED ORDER — FREESTYLE LIBRE 14 DAY SENSOR MISC: 4 refills | Status: DC

## 2020-07-30 NOTE — Telephone Encounter (Signed)
Paperwork has been filled out and faxed over.

## 2020-07-30 NOTE — Telephone Encounter (Signed)
[name],   This rx can be filled if we send it to [facility]. I am unable to send this order. Are you able to?

## 2020-08-01 DIAGNOSIS — E1165 Type 2 diabetes mellitus with hyperglycemia: Secondary | ICD-10-CM | POA: Diagnosis not present

## 2020-08-10 ENCOUNTER — Ambulatory Visit: Payer: Medicare Other | Admitting: Podiatry

## 2020-08-15 ENCOUNTER — Ambulatory Visit: Payer: Medicare Other | Admitting: Podiatry

## 2020-08-29 ENCOUNTER — Other Ambulatory Visit: Payer: Self-pay | Admitting: Family Medicine

## 2020-08-29 ENCOUNTER — Encounter: Payer: Medicare Other | Admitting: Family Medicine

## 2020-08-29 DIAGNOSIS — R3913 Splitting of urinary stream: Secondary | ICD-10-CM | POA: Diagnosis not present

## 2020-08-29 DIAGNOSIS — N1831 Chronic kidney disease, stage 3a: Secondary | ICD-10-CM

## 2020-08-29 DIAGNOSIS — R3121 Asymptomatic microscopic hematuria: Secondary | ICD-10-CM | POA: Diagnosis not present

## 2020-08-29 DIAGNOSIS — Z794 Long term (current) use of insulin: Secondary | ICD-10-CM

## 2020-08-29 NOTE — Telephone Encounter (Signed)
Requested Prescriptions  Pending Prescriptions Disp Refills  . insulin detemir (LEVEMIR FLEXTOUCH) 100 UNIT/ML FlexPen [Pharmacy Med Name: Levemir FlexTouch 100 UNIT/ML Subcutaneous Solution Pen-injector] 90 mL 1    Sig: INJECT SUBCUTANEOUSLY 50  UNITS TWICE DAILY     Endocrinology:  Diabetes - Insulins Passed - 08/29/2020  7:39 PM      Passed - HBA1C is between 0 and 7.9 and within 180 days    HbA1c, POC (controlled diabetic range)  Date Value Ref Range Status  05/11/2020 6.4 0.0 - 7.0 % Final   Hgb A1c MFr Bld  Date Value Ref Range Status  07/06/2020 7.5 (H) 4.8 - 5.6 % Final    Comment:             Prediabetes: 5.7 - 6.4          Diabetes: >6.4          Glycemic control for adults with diabetes: <7.0          Passed - Valid encounter within last 6 months    Recent Outpatient Visits          1 month ago Hypertriglyceridemia   [place], [place], MD   3 months ago Type 2 diabetes mellitus with stage 3a chronic kidney disease, with long-term current use of insulin ([place])   [place], Neoma Laming B, MD   6 months ago Hypertension in stage 3 chronic kidney disease due to type 2 diabetes mellitus ([place])   [place], Charlane Ferretti, MD   9 months ago Essential hypertension   [place], Sherrard, MD   10 months ago Type 2 diabetes mellitus with stage 3a chronic kidney disease, with long-term current use of insulin ([place])   [place], Enobong, MD      Future Appointments            In 1 month [name], MD [place]

## 2020-08-30 ENCOUNTER — Ambulatory Visit (INDEPENDENT_AMBULATORY_CARE_PROVIDER_SITE_OTHER): Payer: Medicare Other | Admitting: Endocrinology

## 2020-08-30 ENCOUNTER — Other Ambulatory Visit: Payer: Self-pay

## 2020-08-30 VITALS — BP 160/94 | HR 106 | Ht 77.0 in | Wt 210.6 lb

## 2020-08-30 DIAGNOSIS — R000 Tachycardia, unspecified: Secondary | ICD-10-CM

## 2020-08-30 DIAGNOSIS — E1122 Type 2 diabetes mellitus with diabetic chronic kidney disease: Secondary | ICD-10-CM

## 2020-08-30 DIAGNOSIS — Z794 Long term (current) use of insulin: Secondary | ICD-10-CM | POA: Diagnosis not present

## 2020-08-30 DIAGNOSIS — N1831 Chronic kidney disease, stage 3a: Secondary | ICD-10-CM | POA: Diagnosis not present

## 2020-08-30 LAB — POCT GLYCOSYLATED HEMOGLOBIN (HGB A1C): Hemoglobin A1C: 8 % — AB (ref 4.0–5.6)

## 2020-08-30 LAB — T4, FREE: Free T4: 0.99 ng/dL (ref 0.60–1.60)

## 2020-08-30 LAB — TSH: TSH: 1.83 u[IU]/mL (ref 0.35–4.50)

## 2020-08-30 MED ORDER — INSULIN GLARGINE-YFGN 100 UNIT/ML ~~LOC~~ SOPN: 50.0000 [IU] | PEN_INJECTOR | SUBCUTANEOUS | 3 refills | Status: DC

## 2020-08-30 NOTE — Patient Instructions (Addendum)
Your blood pressure is high today.  Please see your primary care provider soon, to have it rechecked good diet and exercise significantly improve the control of your diabetes.  please let me know if you wish to be referred to a dietician.  high blood sugar is very risky to your health.  you should see an eye doctor and dentist every year.  It is very important to get all recommended vaccinations.  Controlling your blood pressure and cholesterol drastically reduces the damage diabetes does to your body.  Those who smoke should quit.  Please discuss these with your doctor.  please call us if your blood sugar goes below 70, or if your blood sugar is mostly over 200.  I have sent a prescription to your pharmacy, to change the Levemir to Semglee, 50 units each morning.   Blood tests are requested for you today.  We'll let you know about the results.  Please come back for a follow-up appointment in 2 months.

## 2020-08-30 NOTE — Progress Notes (Signed)
Subjective:    Patient ID: [name]., male    DOB: 02/26/1966, 55 y.o.   MRN: [id number]  HPI Sister [name]) provides hx, due to pt's poor memory.  I last saw pt in 09-Aug-2015, for low testosterone.  pt is referred by Dr Margarita Rana, for diabetes.  Pt states DM was dx'ed in 3299; it is complicated by PN and stage 4 CRI; he has been on insulin since 08/09/2015; pt says his diet and exercise are poor; he has never had pancreatitis, pancreatic surgery, severe hypoglycemia or DKA.  rx has been limited by poor memory and frequent falls.  He has recently lost a few lbs, due to dental surgery.  He takes Levemir 50 units BID.  Novolog was denied by ins, so he has not recently taken.   Pt lives alone.  Aide visits 10AM-2PM (Mon-Sat).  Pt gives her own insulin. Pt and sister say he sometimes misses the insulin.  I reviewed continuous glucose monitor data.  Glucose varies from 110-330.  It is in general highest at 12N, but there is little trend throughout the day.   Past Medical History:  Diagnosis Date  . ADD (attention deficit disorder)   . Anxiety   . Arthritis    right hip  . Bipolar 1 disorder ([place])   . Blood in urine   . CKD (chronic kidney disease), stage III ([place])   . Creatinine elevation   . Dementia ([place])    "early onset" (08/04/2017)  . Depression    bipolar [facility]  . Diabetes mellitus without complication ([place])   . Family history of anesthesia complication    pt is unsure , but pt father may have been difficult to arouse   . HCAP (healthcare-associated pneumonia) 10/31/2012  . History of kidney stones   . Hypertension   . Hypogonadism male   . Liver fatty degeneration   . Microscopic hematuria    hereditary s/p Urology eval  . Neuromuscular disorder ([place])    feet neuropathy   . Osteoarthritis of right hip 11/28/2011   2012 2015 s/p THR Severe  Dr Novella Olive    . Pleural effusion 11/02/2012  . Pneumonia 10-2012  . Pneumonia, organism unspecified(486) 11/02/2012  .  Polysubstance dependence, non-opioid, in remission ([place])    remote  . Primary osteoarthritis of left hip 05/22/2015  . PTSD (post-traumatic stress disorder)    SOCIAL ANXIETY DISORDER   . Schizoaffective disorder ([place])   . Substance abuse ([place])   . Suicide attempt by multiple drug overdose ([place]) 01-23-2016   Grieving his cat's death August 09, 2015    Past Surgical History:  Procedure Laterality Date  . BACK SURGERY    . CLOSED REDUCTION METACARPAL WITH PERCUTANEOUS PINNING Right   . LUMBAR DISC SURGERY    . TONSILLECTOMY    . TOTAL HIP ARTHROPLASTY Right 08/16/2013   Procedure: TOTAL HIP ARTHROPLASTY ANTERIOR APPROACH;  Surgeon: Hessie Dibble, MD;  Location: [facility];  Service: Orthopedics;  Laterality: Right;  . TOTAL HIP ARTHROPLASTY Left 05/22/2015   Procedure: TOTAL HIP ARTHROPLASTY ANTERIOR APPROACH;  Surgeon: Melrose Nakayama, MD;  Location: [facility];  Service: Orthopedics;  Laterality: Left;    Social History   Socioeconomic History  . Marital status: Single    Spouse name: Not on file  . Number of children: 0  . Years of education: Not on file  . Highest education level: Some college, no degree  Occupational History  . Occupation: disability  Tobacco Use  .  Smoking status: Former Smoker    Years: 0.25    Types: Cigarettes    Quit date: 03/22/2019    Years since quitting: 1.4  . Smokeless tobacco: Never Used  Vaping Use  . Vaping Use: Never used  Substance and Sexual Activity  . Alcohol use: Not Currently  . Drug use: No    Comment: hx of marijuana/cocaine/crack use but sober since 20's  . Sexual activity: Not Currently  Other Topics Concern  . Not on file  Social History Narrative   09/14/19 lives alone, sister [name] helps with meds, he has some in home care, lived with sister until Nov 2020   Caffeine- sodas, amount  varies   regular exercise-no   Social Determinants of Health   Financial Resource Strain: Not on file  Food Insecurity: Not on file  Transportation Needs: Not  on file  Physical Activity: Not on file  Stress: Not on file  Social Connections: Not on file  Intimate Partner Violence: Not on file    Current Outpatient Medications on File Prior to Visit  Medication Sig Dispense Refill  . alfuzosin (UROXATRAL) 10 MG 24 hr tablet Take 10 mg by mouth at bedtime.     . ARIPiprazole (ABILIFY) 5 MG tablet TAKE 1 TABLET BY MOUTH DAILY FOR DEPRESSION (Patient taking differently: Take 5 mg by mouth daily.) 30 tablet 0  . atorvastatin (LIPITOR) 10 MG tablet Take 1 tablet (10 mg total) by mouth at bedtime. 90 tablet 1  . buPROPion (WELLBUTRIN XL) 300 MG 24 hr tablet Take 1 tablet (300 mg total) by mouth daily. 30 tablet 0  . busPIRone (BUSPAR) 15 MG tablet Take 15 mg by mouth 2 (two) times daily.     . ciclopirox (PENLAC) 8 % solution Apply topically at bedtime. Apply over nail and surrounding skin. Apply daily over previous coat. After seven (7) days, may remove with alcohol and continue cycle. 6.6 mL 2  . Continuous Blood Gluc Receiver (FREESTYLE LIBRE READER) DEVI Use as directed 1 each each  . Continuous Blood Gluc Sensor (FREESTYLE LIBRE 14 DAY SENSOR) MISC Use as directed 2 each 4  . divalproex (DEPAKOTE ER) 500 MG 24 hr tablet Take 500 mg by mouth daily.    . fenofibrate (TRICOR) 145 MG tablet Take 1 tablet (145 mg total) by mouth daily. 90 tablet 1  . FLUARIX QUADRIVALENT 0.5 ML injection     . fluticasone (FLONASE) 50 MCG/ACT nasal spray Place 1 spray into both nostrils daily. 48 g 3  . gabapentin (NEURONTIN) 300 MG capsule Take 1 capsule (300 mg total) by mouth 2 (two) times daily. 180 capsule 1  . glucose blood (ACCU-CHEK GUIDE) test strip CHECK SUGAR FOUR TIMES DAILY 100 each 12  . Insulin Pen Needle (B-D UF III MINI PEN NEEDLES) 31G X 5 MM MISC USE FOUR TIMES DAILY 120 each 12  . lisinopril (ZESTRIL) 5 MG tablet Take 1 tablet (5 mg total) by mouth daily. 90 tablet 1  . mupirocin ointment (BACTROBAN) 2 % Apply 1 application topically 2 (two) times  daily. 30 g 2  . nicotine (NICODERM CQ) 14 mg/24hr patch Place 1 patch (14 mg total) onto the skin daily. Then 7 mg/24 hr patch for 1 month 28 patch 1  . pantoprazole (PROTONIX) 40 MG tablet Take 1 tablet (40 mg total) by mouth daily. 90 tablet 1  . QUEtiapine (SEROQUEL) 400 MG tablet Take 400 mg by mouth as needed.    . tadalafil (CIALIS) 5 MG  tablet TAKE ONE TABLET BY MOUTH EVERY DAY FOR BPH 90 tablet 1  . Testosterone (ANDROGEL) 20.25 MG/1.25GM (1.62%) GEL Apply 20.25 mg topically every morning. Apply topically under arm pit alternating with each application. 1.25 g 5  . Testosterone 1.62 % GEL     . tiZANidine (ZANAFLEX) 4 MG tablet Take 1 tablet (4 mg total) by mouth every 8 (eight) hours as needed for muscle spasms. 90 tablet 2  . traMADol (ULTRAM) 50 MG tablet Take 1 tablet (50 mg total) by mouth at bedtime as needed (pain). 30 tablet 2  . traZODone (DESYREL) 150 MG tablet Take 1 tablet (150 mg total) by mouth at bedtime. 90 tablet 1  . divalproex (DEPAKOTE) 500 MG DR tablet Take 3 tablets (1,500 mg total) by mouth at bedtime. 90 tablet 0   No current facility-administered medications on file prior to visit.    Allergies  Allergen Reactions  . Vicodin [Hydrocodone-Acetaminophen] Itching    Family History  Problem Relation Age of Onset  . Diabetes Father   . Cancer Mother        died of melanoma with mets  . Cervical cancer Sister   . Diabetes Sister   . Other Neg Hx        hypogonadism  . Colon cancer Neg Hx   . Colon polyps Neg Hx   . Esophageal cancer Neg Hx   . Rectal cancer Neg Hx   . Stomach cancer Neg Hx     BP (!) 160/94 (BP Location: Right Arm, Patient Position: Sitting, Cuff Size: Normal)   Pulse (!) 106   Ht 6\' 5"  (1.956 m)   Wt 210 lb 9.6 oz (95.5 kg)   SpO2 97%   BMI 24.97 kg/m    Review of Systems denies sob.      Objective:   Physical Exam VITAL SIGNS:  See vs page GENERAL: no distress Pulses: dorsalis pedis intact bilat.   MSK: no deformity  of the feet CV: no leg edema.   Skin:  no ulcer on the feet.  normal color and temp on the feet.  Neuro: sensation is intact to touch on the feet, but decreased from normal.     Lab Results  Component Value Date   HGBA1C 8.0 (A) 08/30/2020   Lab Results  Component Value Date   CREATININE 1.97 (H) 07/06/2020   BUN 26 (H) 07/06/2020   NA 136 07/06/2020   K 4.9 07/06/2020   CL 94 (L) 07/06/2020   CO2 21 07/06/2020        Assessment & Plan:  Insulin-requiring type 2 DM: uncontrolled.     Patient Instructions  Your blood pressure is high today.  Please see your primary care provider soon, to have it rechecked good diet and exercise significantly improve the control of your diabetes.  please let me know if you wish to be referred to a dietician.  high blood sugar is very risky to your health.  you should see an eye doctor and dentist every year.  It is very important to get all recommended vaccinations.  Controlling your blood pressure and cholesterol drastically reduces the damage diabetes does to your body.  Those who smoke should quit.  Please discuss these with your doctor.  please call us if your blood sugar goes below 70, or if your blood sugar is mostly over 200.  I have sent a prescription to your pharmacy, to change the Levemir to Semglee, 50 units each morning.   Blood  tests are requested for you today.  We'll let you know about the results.  Please come back for a follow-up appointment in 2 months.

## 2020-08-31 DIAGNOSIS — E1165 Type 2 diabetes mellitus with hyperglycemia: Secondary | ICD-10-CM | POA: Diagnosis not present

## 2020-08-31 MED ORDER — LANTUS SOLOSTAR 100 UNIT/ML ~~LOC~~ SOPN: 50.0000 [IU] | PEN_INJECTOR | SUBCUTANEOUS | 99 refills | Status: DC

## 2020-09-06 ENCOUNTER — Encounter: Payer: Medicare Other | Admitting: Family Medicine

## 2020-09-07 ENCOUNTER — Telehealth: Payer: Self-pay | Admitting: Endocrinology

## 2020-09-07 NOTE — Telephone Encounter (Signed)
Pt calling in stating that insulin glargine (LANTUS SOLOSTAR) 100 UNIT/ML Solostar Pen is not covered by his insurance and needs a substitute pt states that he would like something 24 hours.    [place], [place], [address]

## 2020-09-11 ENCOUNTER — Encounter: Payer: Self-pay | Admitting: Family Medicine

## 2020-09-12 ENCOUNTER — Other Ambulatory Visit: Payer: Self-pay | Admitting: Family Medicine

## 2020-09-12 MED ORDER — VARENICLINE TARTRATE 1 MG PO TABS: 1.0000 mg | ORAL_TABLET | Freq: Two times a day (BID) | ORAL | 1 refills | Status: DC

## 2020-09-16 ENCOUNTER — Other Ambulatory Visit: Payer: Self-pay | Admitting: Family Medicine

## 2020-09-16 DIAGNOSIS — M5136 Other intervertebral disc degeneration, lumbar region: Secondary | ICD-10-CM

## 2020-09-18 NOTE — Telephone Encounter (Signed)
Requested medication (s) are due for refill today: yes  Requested medication (s) are on the active medication list: yes  Last refill: 07/09/20  Future visit scheduled: yes  Notes to clinic:  not delegated    Requested Prescriptions  Pending Prescriptions Disp Refills   tiZANidine (ZANAFLEX) 4 MG tablet [Pharmacy Med Name: tiZANidine HCl 4 MG Oral Tablet] 90 tablet 2    Sig: TAKE 1 TABLET BY MOUTH  EVERY 8 HOURS AS NEEDED FOR MUSCLE SPASM(S)      Not Delegated - Cardiovascular:  Alpha-2 Agonists - tizanidine Failed - 09/16/2020  9:43 PM      Failed - This refill cannot be delegated      Passed - Valid encounter within last 6 months    Recent Outpatient Visits           2 months ago Hypertriglyceridemia   [place], Charlane Ferretti, MD   4 months ago Type 2 diabetes mellitus with stage 3a chronic kidney disease, with long-term current use of insulin ([place])   [place], Neoma Laming B, MD   6 months ago Hypertension in stage 3 chronic kidney disease due to type 2 diabetes mellitus ([place])   [place], Charlane Ferretti, MD   10 months ago Essential hypertension   [place], [place], MD   10 months ago Type 2 diabetes mellitus with stage 3a chronic kidney disease, with long-term current use of insulin ([place])   [place], MD       Future Appointments             In 2 months [name], MD [place]

## 2020-09-19 ENCOUNTER — Ambulatory Visit: Payer: Medicare Other | Admitting: Podiatry

## 2020-09-30 DIAGNOSIS — E1165 Type 2 diabetes mellitus with hyperglycemia: Secondary | ICD-10-CM | POA: Diagnosis not present

## 2020-10-01 ENCOUNTER — Ambulatory Visit: Payer: Medicare Other | Admitting: Podiatry

## 2020-10-02 ENCOUNTER — Encounter: Payer: Medicare Other | Admitting: Family Medicine

## 2020-10-04 ENCOUNTER — Other Ambulatory Visit: Payer: Self-pay | Admitting: Family Medicine

## 2020-10-04 DIAGNOSIS — B394 Histoplasmosis capsulati, unspecified: Secondary | ICD-10-CM | POA: Diagnosis not present

## 2020-10-04 DIAGNOSIS — M5136 Other intervertebral disc degeneration, lumbar region: Secondary | ICD-10-CM

## 2020-10-04 DIAGNOSIS — D3132 Benign neoplasm of left choroid: Secondary | ICD-10-CM | POA: Diagnosis not present

## 2020-10-10 ENCOUNTER — Other Ambulatory Visit: Payer: Self-pay

## 2020-10-10 ENCOUNTER — Encounter: Payer: Self-pay | Admitting: Podiatry

## 2020-10-10 ENCOUNTER — Ambulatory Visit (INDEPENDENT_AMBULATORY_CARE_PROVIDER_SITE_OTHER): Payer: Medicare Other | Admitting: Podiatry

## 2020-10-10 DIAGNOSIS — B351 Tinea unguium: Secondary | ICD-10-CM

## 2020-10-10 DIAGNOSIS — M79675 Pain in left toe(s): Secondary | ICD-10-CM

## 2020-10-10 DIAGNOSIS — M79674 Pain in right toe(s): Secondary | ICD-10-CM

## 2020-10-12 DIAGNOSIS — Z794 Long term (current) use of insulin: Secondary | ICD-10-CM | POA: Diagnosis not present

## 2020-10-12 DIAGNOSIS — H31001 Unspecified chorioretinal scars, right eye: Secondary | ICD-10-CM | POA: Diagnosis not present

## 2020-10-12 DIAGNOSIS — E119 Type 2 diabetes mellitus without complications: Secondary | ICD-10-CM | POA: Diagnosis not present

## 2020-10-13 ENCOUNTER — Other Ambulatory Visit: Payer: Self-pay | Admitting: Family Medicine

## 2020-10-13 DIAGNOSIS — M5136 Other intervertebral disc degeneration, lumbar region: Secondary | ICD-10-CM

## 2020-10-13 NOTE — Progress Notes (Signed)
  Subjective:  Patient ID: Jonathan Meckel., male    DOB: 11/25/1965,  MRN: [id number]  [name]. presents to clinic today for preventative diabetic foot care and painful thick toenails that are difficult to trim. Pain interferes with ambulation. Aggravating factors include wearing enclosed shoe gear. Pain is relieved with periodic professional debridement.  Patient states blood glucose was 240 mg/dl today.  Patient does not monitor blood glucose daily.  He states he is a fall risk.    PCP is [name], MD , and last visit was 07/09/2020. He is also followed by Dr. Loanne Drilling for Endocrinology and last visit was 08/30/2020.  Allergies  Allergen Reactions   Vicodin [Hydrocodone-Acetaminophen] Itching    Review of Systems: Negative except as noted in the HPI. Objective:   Constitutional [name]. is a pleasant 55 y.o. Caucasian male, WD, WN in NAD. AAO x 3.   Vascular Capillary refill time to digits immediate b/l. Palpable pedal pulses b/l LE. Pedal hair present. Lower extremity skin temperature gradient within normal limits. No pain with calf compression b/l. No edema noted b/l lower extremities. No cyanosis or clubbing noted.  Neurologic Normal speech. Oriented to person, place, and time. Protective sensation decreased with 10 gram monofilament b/l.  Dermatologic Pedal skin with normal turgor, texture and tone b/l lower extremities No open wounds b/l lower extremities No interdigital macerations b/l lower extremities Toenails 1-5 b/l elongated, discolored, dystrophic, thickened, crumbly with subungual debris and tenderness to dorsal palpation.  Orthopedic: Normal muscle strength 5/5 to all lower extremity muscle groups bilaterally. No pain crepitus or joint limitation noted with ROM b/l. No gross bony deformities bilaterally. Utilizes rollator for ambulation assistance.   Radiographs: None Assessment:   1. Dermatophytosis of nail   2. Pain in toes of  both feet    Plan:  Patient was evaluated and treated and all questions answered.  Onychomycosis with pain -Nails palliatively debridement as below -Educated on self-care  Procedure: Nail Debridement Rationale: Pain Type of Debridement: manual, sharp debridement. Instrumentation: Nail nipper, rotary burr. Number of Nails: 10 -Examined patient. -Continue diabetic foot care principles. -Patient to continue soft, supportive shoe gear daily. -Toenails 1-5 b/l were debrided in length and girth with sterile nail nippers and dremel without iatrogenic bleeding.  -Patient to report any pedal injuries to medical professional immediately. -Patient/POA to call should there be question/concern in the interim.  Return in about 3 months (around 01/10/2021).  Marzetta Board, DPM

## 2020-10-14 NOTE — Telephone Encounter (Signed)
Requested medication (s) are due for refill today: yes  Requested medication (s) are on the active medication list: yes  Last refill:  07/09/20  Future visit scheduled: yes  Notes to clinic:  Med not delegated to NT to RF   Requested Prescriptions  Pending Prescriptions Disp Refills   traMADol (ULTRAM) 50 MG tablet [Pharmacy Med Name: traMADol HCl 50 MG Oral Tablet] 30 tablet     Sig: TAKE 1 TABLET BY MOUTH AT  BEDTIME AS NEEDED      Not Delegated - Analgesics:  Opioid Agonists Failed - 10/13/2020  7:14 PM      Failed - This refill cannot be delegated      Passed - Urine Drug Screen completed in last 360 days      Passed - Valid encounter within last 6 months    Recent Outpatient Visits           3 months ago Hypertriglyceridemia   [place], [place], MD   5 months ago Type 2 diabetes mellitus with stage 3a chronic kidney disease, with long-term current use of insulin ([place])   [place], Neoma Laming B, MD   7 months ago Hypertension in stage 3 chronic kidney disease due to type 2 diabetes mellitus ([place])   [place], Charlane Ferretti, MD   10 months ago Essential hypertension   [place], [place], MD   11 months ago Type 2 diabetes mellitus with stage 3a chronic kidney disease, with long-term current use of insulin ([place])   [place], MD       Future Appointments             In 1 month [name], MD [place]

## 2020-10-30 DIAGNOSIS — E1165 Type 2 diabetes mellitus with hyperglycemia: Secondary | ICD-10-CM | POA: Diagnosis not present

## 2020-11-01 ENCOUNTER — Other Ambulatory Visit: Payer: Self-pay | Admitting: Family Medicine

## 2020-11-01 DIAGNOSIS — Z794 Long term (current) use of insulin: Secondary | ICD-10-CM

## 2020-11-01 DIAGNOSIS — N1831 Chronic kidney disease, stage 3a: Secondary | ICD-10-CM

## 2020-11-01 NOTE — Telephone Encounter (Signed)
Requested medication (s) are due for refill today: Yes  Requested medication (s) are on the active medication list: Yes  Last refill:  07/09/20  Future visit scheduled: Yes  Notes to clinic:  Pharmacy requesting 1 year supply.    Requested Prescriptions  Pending Prescriptions Disp Refills   atorvastatin (LIPITOR) 10 MG tablet [Pharmacy Med Name: Atorvastatin Calcium 10 MG Oral Tablet] 90 tablet 3    Sig: TAKE 1 TABLET BY MOUTH AT  BEDTIME      Cardiovascular:  Antilipid - Statins Failed - 11/01/2020  2:52 PM      Failed - HDL in normal range and within 360 days    HDL  Date Value Ref Range Status  07/06/2020 39 (L) >39 mg/dL Final          Failed - Triglycerides in normal range and within 360 days    Triglycerides  Date Value Ref Range Status  07/06/2020 323 (H) 0 - 149 mg/dL Final          Passed - Total Cholesterol in normal range and within 360 days    Cholesterol, Total  Date Value Ref Range Status  07/06/2020 167 100 - 199 mg/dL Final          Passed - LDL in normal range and within 360 days    LDL Chol Calc (NIH)  Date Value Ref Range Status  07/06/2020 76 0 - 99 mg/dL Final   Direct LDL  Date Value Ref Range Status  06/02/2016 72.0 mg/dL Final    Comment:    Optimal:  <100 mg/dLNear or Above Optimal:  100-129 mg/dLBorderline High:  130-159 mg/dLHigh:  160-189 mg/dLVery High:  >190 mg/dL          Passed - Patient is not pregnant      Passed - Valid encounter within last 12 months    Recent Outpatient Visits           3 months ago Hypertriglyceridemia   [place], [place], MD   5 months ago Type 2 diabetes mellitus with stage 3a chronic kidney disease, with long-term current use of insulin ([place])   [place], Neoma Laming B, MD   8 months ago Hypertension in stage 3 chronic kidney disease due to type 2 diabetes mellitus ([place])   [place],  Charlane Ferretti, MD   11 months ago Essential hypertension   [place], [place], MD   1 year ago Type 2 diabetes mellitus with stage 3a chronic kidney disease, with long-term current use of insulin ([place])   [place], Charlane Ferretti, MD       Future Appointments             In 2 weeks [name], MD [place]               pantoprazole (PROTONIX) 40 MG tablet [Pharmacy Med Name: Pantoprazole Sodium 40 MG Oral Tablet Delayed Release] 90 tablet 3    Sig: TAKE 1 TABLET BY MOUTH  DAILY      Gastroenterology: Proton Pump Inhibitors Passed - 11/01/2020  2:52 PM      Passed - Valid encounter within last 12 months    Recent Outpatient Visits           3 months ago Hypertriglyceridemia   [facility] [name], MD  5 months ago Type 2 diabetes mellitus with stage 3a chronic kidney disease, with long-term current use of insulin ([place])   [place], Neoma Laming B, MD   8 months ago Hypertension in stage 3 chronic kidney disease due to type 2 diabetes mellitus [facility])   [place], Enobong, MD   11 months ago Essential hypertension   [place], Charlane Ferretti, MD   1 year ago Type 2 diabetes mellitus with stage 3a chronic kidney disease, with long-term current use of insulin ([place])   [place], Enobong, MD       Future Appointments             In 2 weeks [name], MD [place]

## 2020-11-10 ENCOUNTER — Other Ambulatory Visit: Payer: Self-pay | Admitting: Family Medicine

## 2020-11-10 DIAGNOSIS — I152 Hypertension secondary to endocrine disorders: Secondary | ICD-10-CM

## 2020-11-10 DIAGNOSIS — E781 Pure hyperglyceridemia: Secondary | ICD-10-CM

## 2020-11-11 NOTE — Telephone Encounter (Signed)
Requested Prescriptions  Pending Prescriptions Disp Refills  . varenicline (CHANTIX) 1 MG tablet [Pharmacy Med Name: VARENICLINE  1MG  TAB] 56 tablet 0    Sig: TAKE 1 TABLET BY MOUTH  TWICE DAILY     Psychiatry:  Drug Dependence Therapy Passed - 11/10/2020  2:14 PM      Passed - Valid encounter within last 12 months    Recent Outpatient Visits          4 months ago Hypertriglyceridemia   [place], Charlane Ferretti, MD   6 months ago Type 2 diabetes mellitus with stage 3a chronic kidney disease, with long-term current use of insulin ([place])   [place], Neoma Laming B, MD   8 months ago Hypertension in stage 3 chronic kidney disease due to type 2 diabetes mellitus ([place])   [place], Charlane Ferretti, MD   11 months ago Essential hypertension   [place], [place], MD   1 year ago Type 2 diabetes mellitus with stage 3a chronic kidney disease, with long-term current use of insulin ([place])   [place], [place], MD             . lisinopril (ZESTRIL) 5 MG tablet [Pharmacy Med Name: Lisinopril 5 MG Oral Tablet] 90 tablet     Sig: TAKE 1 TABLET BY MOUTH  DAILY     Cardiovascular:  ACE Inhibitors Failed - 11/10/2020  2:14 PM      Failed - Cr in normal range and within 180 days    Creatinine, Ser  Date Value Ref Range Status  07/06/2020 1.97 (H) 0.76 - 1.27 mg/dL Final   Creatinine,U  Date Value Ref Range Status  04/18/2015 250.2 mg/dL Final   Creatinine, Urine  Date Value Ref Range Status  10/03/2015 35.87 mg/dL Final         Failed - Last BP in normal range    BP Readings from Last 1 Encounters:  08/30/20 (!) 160/94         Passed - K in normal range and within 180 days    Potassium  Date Value Ref Range Status  07/06/2020 4.9 3.5 - 5.2 mmol/L Final         Passed - Patient is not pregnant      Passed -  Valid encounter within last 6 months    Recent Outpatient Visits          4 months ago Hypertriglyceridemia   [place], [place], MD   6 months ago Type 2 diabetes mellitus with stage 3a chronic kidney disease, with long-term current use of insulin ([place])   [place], Neoma Laming B, MD   8 months ago Hypertension in stage 3 chronic kidney disease due to type 2 diabetes mellitus ([place])   [place], Charlane Ferretti, MD   11 months ago Essential hypertension   [place], [place], MD   1 year ago Type 2 diabetes mellitus with stage 3a chronic kidney disease, with long-term current use of insulin ([place])   [place], T[place], MD             . fenofibrate (TRICOR) 145 MG tablet [Pharmacy Med Name: Fenofibrate 145 MG Oral Tablet] 90 tablet     Sig: TAKE 1  TABLET BY MOUTH  DAILY     Cardiovascular:  Antilipid - Fibric Acid Derivatives Failed - 11/10/2020  2:14 PM      Failed - HDL in normal range and within 360 days    HDL  Date Value Ref Range Status  07/06/2020 39 (L) >39 mg/dL Final         Failed - Triglycerides in normal range and within 360 days    Triglycerides  Date Value Ref Range Status  07/06/2020 323 (H) 0 - 149 mg/dL Final         Failed - Cr in normal range and within 180 days    Creatinine, Ser  Date Value Ref Range Status  07/06/2020 1.97 (H) 0.76 - 1.27 mg/dL Final   Creatinine,U  Date Value Ref Range Status  04/18/2015 250.2 mg/dL Final   Creatinine, Urine  Date Value Ref Range Status  10/03/2015 35.87 mg/dL Final         Failed - eGFR in normal range and within 180 days    GFR calc Af Amer  Date Value Ref Range Status  01/04/2020 >60 >60 mL/min Final   GFR calc non Af Amer  Date Value Ref Range Status  01/04/2020 54 (L) >60 mL/min Final   GFR  Date Value Ref Range Status   06/02/2016 33.36 (L) >60.00 mL/min Final   eGFR  Date Value Ref Range Status  07/06/2020 40 (L) >59 mL/min/1.73 Final         Passed - Total Cholesterol in normal range and within 360 days    Cholesterol, Total  Date Value Ref Range Status  07/06/2020 167 100 - 199 mg/dL Final         Passed - LDL in normal range and within 360 days    LDL Chol Calc (NIH)  Date Value Ref Range Status  07/06/2020 76 0 - 99 mg/dL Final   Direct LDL  Date Value Ref Range Status  06/02/2016 72.0 mg/dL Final    Comment:    Optimal:  <100 mg/dLNear or Above Optimal:  100-129 mg/dLBorderline High:  130-159 mg/dLHigh:  160-189 mg/dLVery High:  >190 mg/dL         Passed - ALT in normal range and within 180 days    ALT  Date Value Ref Range Status  07/06/2020 16 0 - 44 IU/L Final         Passed - AST in normal range and within 180 days    AST  Date Value Ref Range Status  07/06/2020 23 0 - 40 IU/L Final         Passed - Valid encounter within last 12 months    Recent Outpatient Visits          4 months ago Hypertriglyceridemia   [place], [place], MD   6 months ago Type 2 diabetes mellitus with stage 3a chronic kidney disease, with long-term current use of insulin ([place])   [place], Neoma Laming B, MD   8 months ago Hypertension in stage 3 chronic kidney disease due to type 2 diabetes mellitus ([place])   [place], [place], MD   11 months ago Essential hypertension   [place], Charlane Ferretti, MD   1 year ago Type 2 diabetes mellitus with stage 3a chronic kidney disease, with long-term current use of insulin [facility])   [place], [place],  MD

## 2020-11-14 ENCOUNTER — Ambulatory Visit (INDEPENDENT_AMBULATORY_CARE_PROVIDER_SITE_OTHER): Payer: Medicare Other | Admitting: Endocrinology

## 2020-11-14 ENCOUNTER — Other Ambulatory Visit: Payer: Self-pay

## 2020-11-14 VITALS — BP 118/74 | HR 88 | Ht 75.0 in | Wt 215.6 lb

## 2020-11-14 DIAGNOSIS — Z794 Long term (current) use of insulin: Secondary | ICD-10-CM

## 2020-11-14 DIAGNOSIS — N1831 Chronic kidney disease, stage 3a: Secondary | ICD-10-CM | POA: Diagnosis not present

## 2020-11-14 DIAGNOSIS — E1122 Type 2 diabetes mellitus with diabetic chronic kidney disease: Secondary | ICD-10-CM | POA: Diagnosis not present

## 2020-11-14 LAB — POCT GLYCOSYLATED HEMOGLOBIN (HGB A1C): Hemoglobin A1C: 8.1 % — AB (ref 4.0–5.6)

## 2020-11-14 MED ORDER — LANTUS SOLOSTAR 100 UNIT/ML ~~LOC~~ SOPN: 45.0000 [IU] | PEN_INJECTOR | SUBCUTANEOUS | 99 refills | Status: DC

## 2020-11-14 MED ORDER — NOVOLOG FLEXPEN 100 UNIT/ML ~~LOC~~ SOPN: 5.0000 [IU] | PEN_INJECTOR | Freq: Every day | SUBCUTANEOUS | 3 refills | Status: DC

## 2020-11-14 NOTE — Patient Instructions (Addendum)
Please reduce the Lantus to 45 units each morning, and add 5 units of humalog with breakfast. please call us if your blood sugar goes below 70, or if your blood sugar is mostly over 200.   Blood tests are requested for you today.  We'll let you know about the results.  Please come back for a follow-up appointment in 2 months.

## 2020-11-14 NOTE — Progress Notes (Signed)
Subjective:    Patient ID: [name]., male    DOB: 1966-03-11, 55 y.o.   MRN: [id number]  HPI Pt returns for f/u of diabetes mellitus: DM type: Insulin-requiring type 2. Dx'ed: 2025 Complications: PN and stage 4 CRI Therapy: insulin since July 29, 2015 DKA: never Severe hypoglycemia: never Pancreatitis: never Pancreatic imaging: normal on Jul 28, 2013 CT SDOH: Sister [name]) provides hx; rx has been limited by poor memory and frequent falls; Aide visits 10AM-2PM (Mon-Sat).  Pt gives her own insulin. Pt and sister say he sometimes misses the insulin. Other: none Interval history: I reviewed continuous glucose monitor data.  Glucose varies from 80-250.  It is in general highest at [place]; it is lowest at [place] and less so at [place].   He seldom has hypoglycemia, and these episodes are mild.  This happens fasting.  Pt says insulin is leaking from the injection site.  Pt says he has not recently missed any insulin doses.   Past Medical History:  Diagnosis Date   ADD (attention deficit disorder)    Anxiety    Arthritis    right hip   Bipolar 1 disorder (HCC)    Blood in urine    CKD (chronic kidney disease), stage III (HCC)    Creatinine elevation    Dementia ([place])    "early onset" (08/04/2017)   Depression    bipolar [facility]   Diabetes mellitus without complication ([place])    Family history of anesthesia complication    pt is unsure , but pt father may have been difficult to arouse    HCAP (healthcare-associated pneumonia) 10/31/2012   History of kidney stones    Hypertension    Hypogonadism male    Liver fatty degeneration    Microscopic hematuria    hereditary s/p Urology eval   Neuromuscular disorder ([place])    feet neuropathy    Osteoarthritis of right hip 11/28/2011   2012 2015 s/p THR Severe  Dr Novella Olive     Pleural effusion 11/02/2012   Pneumonia 10-2012   Pneumonia, organism unspecified([place]) 11/02/2012   Polysubstance dependence, non-opioid, in remission ([place])     remote   Primary osteoarthritis of left hip 05/22/2015   PTSD (post-traumatic stress disorder)    SOCIAL ANXIETY DISORDER    Schizoaffective disorder ([place])    Substance abuse ([place])    Suicide attempt by multiple drug overdose ([place]) 2016/01/12   Grieving his cat's death Jul 29, 2015    Past Surgical History:  Procedure Laterality Date   BACK SURGERY     CLOSED REDUCTION METACARPAL WITH PERCUTANEOUS PINNING Right    LUMBAR [place] HIP ARTHROPLASTY Right 08/16/2013   Procedure: TOTAL HIP ARTHROPLASTY ANTERIOR APPROACH;  Surgeon: Hessie Dibble, MD;  Location: [facility];  Service: Orthopedics;  Laterality: Right;   TOTAL HIP ARTHROPLASTY Left 05/22/2015   Procedure: TOTAL HIP ARTHROPLASTY ANTERIOR APPROACH;  Surgeon: Melrose Nakayama, MD;  Location: [facility];  Service: Orthopedics;  Laterality: Left;    Social History   Socioeconomic History   Marital status: Single    Spouse name: Not on file   Number of children: 0   Years of education: Not on file   Highest education level: Some college, no degree  Occupational History   Occupation: disability  Tobacco Use   Smoking status: Former    Years: 0.25    Types: Cigarettes    Quit date: 03/22/2019    Years  since quitting: 1.6   Smokeless tobacco: Never  Vaping Use   Vaping Use: Never used  Substance and Sexual Activity   Alcohol use: Not Currently   Drug use: No    Comment: hx of marijuana/cocaine/crack use but sober since 20's   Sexual activity: Not Currently  Other Topics Concern   Not on file  Social History Narrative   09/14/19 lives alone, sister [name] helps with meds, he has some in home care, lived with sister until Nov 2020   Caffeine- sodas, amount  varies   regular exercise-no   Social Determinants of Health   Financial Resource Strain: Not on file  Food Insecurity: Not on file  Transportation Needs: Not on file  Physical Activity: Not on file  Stress: Not on file  Social Connections: Not on  file  Intimate Partner Violence: Not on file    Current Outpatient Medications on File Prior to Visit  Medication Sig Dispense Refill   alfuzosin (UROXATRAL) 10 MG 24 hr tablet Take 10 mg by mouth at bedtime.      amoxicillin (AMOXIL) 875 MG tablet Take 875 mg by mouth 2 (two) times daily.     ARIPiprazole (ABILIFY) 5 MG tablet TAKE 1 TABLET BY MOUTH DAILY FOR DEPRESSION (Patient taking differently: Take 5 mg by mouth daily.) 30 tablet 0   atorvastatin (LIPITOR) 10 MG tablet TAKE 1 TABLET BY MOUTH AT  BEDTIME 30 tablet 0   buPROPion (WELLBUTRIN XL) 300 MG 24 hr tablet Take 1 tablet (300 mg total) by mouth daily. 30 tablet 0   busPIRone (BUSPAR) 10 MG tablet Take 10 mg by mouth 2 (two) times daily.     busPIRone (BUSPAR) 15 MG tablet Take 15 mg by mouth 2 (two) times daily.      chlorhexidine (PERIDEX) 0.12 % solution SMARTSIG:By Mouth     ciclopirox (PENLAC) 8 % solution Apply topically at bedtime. Apply over nail and surrounding skin. Apply daily over previous coat. After seven (7) days, may remove with alcohol and continue cycle. 6.6 mL 2   clonazePAM (KLONOPIN) 0.5 MG tablet Take 0.5 mg by mouth 2 (two) times daily as needed.     Continuous Blood Gluc Receiver (FREESTYLE LIBRE READER) DEVI Use as directed 1 each each   Continuous Blood Gluc Sensor (FREESTYLE LIBRE 14 DAY SENSOR) MISC Use as directed 2 each 4   divalproex (DEPAKOTE ER) 500 MG 24 hr tablet Take 500 mg by mouth daily.     fenofibrate (TRICOR) 145 MG tablet Take 1 tablet (145 mg total) by mouth daily. 90 tablet 1   FLUARIX QUADRIVALENT 0.5 ML injection      fluticasone (FLONASE) 50 MCG/ACT nasal spray Place 1 spray into both nostrils daily. 48 g 3   gabapentin (NEURONTIN) 300 MG capsule Take 1 capsule (300 mg total) by mouth 2 (two) times daily. 180 capsule 1   glucose blood (ACCU-CHEK GUIDE) test strip CHECK SUGAR FOUR TIMES DAILY 100 each 12   Insulin Pen Needle (B-D UF III MINI PEN NEEDLES) 31G X 5 MM MISC USE FOUR TIMES  DAILY 120 each 12   lisinopril (ZESTRIL) 5 MG tablet Take 1 tablet (5 mg total) by mouth daily. 90 tablet 1   mupirocin ointment (BACTROBAN) 2 % Apply 1 application topically 2 (two) times daily. 30 g 2   nicotine (NICODERM CQ) 14 mg/24hr patch Place 1 patch (14 mg total) onto the skin daily. Then 7 mg/24 hr patch for 1 month 28 patch 1  oxyCODONE-acetaminophen (PERCOCET/ROXICET) 5-325 MG tablet Take by mouth.     pantoprazole (PROTONIX) 40 MG tablet TAKE 1 TABLET BY MOUTH  DAILY 30 tablet 0   QUEtiapine (SEROQUEL) 400 MG tablet Take 400 mg by mouth as needed.     tadalafil (CIALIS) 5 MG tablet TAKE ONE TABLET BY MOUTH EVERY DAY FOR BPH 90 tablet 1   Testosterone (ANDROGEL) 20.25 MG/1.25GM (1.62%) GEL Apply 20.25 mg topically every morning. Apply topically under arm pit alternating with each application. 1.25 g 5   Testosterone 1.62 % GEL      tiZANidine (ZANAFLEX) 4 MG tablet TAKE 1 TABLET BY MOUTH  EVERY 8 HOURS AS NEEDED FOR MUSCLE SPASM(S) 90 tablet 2   traMADol (ULTRAM) 50 MG tablet TAKE 1 TABLET BY MOUTH AT  BEDTIME AS NEEDED 30 tablet 2   traZODone (DESYREL) 150 MG tablet Take 1 tablet (150 mg total) by mouth at bedtime. 90 tablet 1   varenicline (CHANTIX) 1 MG tablet TAKE 1 TABLET BY MOUTH  TWICE DAILY 56 tablet 0   divalproex (DEPAKOTE) 500 MG DR tablet Take 3 tablets (1,500 mg total) by mouth at bedtime. 90 tablet 0   No current facility-administered medications on file prior to visit.    Allergies  Allergen Reactions   Vicodin [Hydrocodone-Acetaminophen] Itching    Family History  Problem Relation Age of Onset   Diabetes Father    Cancer Mother        died of melanoma with mets   Cervical cancer Sister    Diabetes Sister    Other Neg Hx        hypogonadism   Colon cancer Neg Hx    Colon polyps Neg Hx    Esophageal cancer Neg Hx    Rectal cancer Neg Hx    Stomach cancer Neg Hx     BP 118/74 (BP Location: Left Arm, Patient Position: Sitting, Cuff Size: Normal)    Pulse 88   Ht 6\' 3"  (1.905 m)   Wt 215 lb 9.6 oz (97.8 kg)   SpO2 92%   BMI 26.95 kg/m     Review of Systems     Objective:   Physical Exam Pulses: dorsalis pedis intact bilat.   MSK: no deformity of the feet CV: no leg edema.   Skin:  no ulcer on the feet.  normal color and temp on the feet.  Neuro: sensation is intact to touch on the feet, but severely decreased from normal.    Lab Results  Component Value Date   HGBA1C 8.1 (A) 11/14/2020      Assessment & Plan:  Type 2 DM: uncontrolled.  Insulin leakage: I told pt to push needle in farther, and withdraw slowly.    Patient Instructions  Please reduce the Lantus to 45 units each morning, and add 5 units of humalog with breakfast. please call us if your blood sugar goes below 70, or if your blood sugar is mostly over 200.  I have sent a prescription to your pharmacy, to change the Levemir to Semglee, 50 units each morning.   Blood tests are requested for you today.  We'll let you know about the results.  Please come back for a follow-up appointment in 2 months.

## 2020-11-17 ENCOUNTER — Other Ambulatory Visit: Payer: Self-pay | Admitting: Family Medicine

## 2020-11-17 ENCOUNTER — Encounter: Payer: Self-pay | Admitting: Endocrinology

## 2020-11-17 DIAGNOSIS — M5136 Other intervertebral disc degeneration, lumbar region: Secondary | ICD-10-CM

## 2020-11-18 NOTE — Telephone Encounter (Signed)
Last RF 07/09/20 #180 1 RF

## 2020-11-19 ENCOUNTER — Ambulatory Visit: Payer: Medicare Other | Attending: Family Medicine | Admitting: Family Medicine

## 2020-11-19 ENCOUNTER — Other Ambulatory Visit: Payer: Self-pay

## 2020-11-19 ENCOUNTER — Encounter: Payer: Self-pay | Admitting: Family Medicine

## 2020-11-19 VITALS — BP 129/79 | HR 91 | Ht 75.0 in | Wt 217.2 lb

## 2020-11-19 DIAGNOSIS — Z0000 Encounter for general adult medical examination without abnormal findings: Secondary | ICD-10-CM

## 2020-11-19 DIAGNOSIS — E349 Endocrine disorder, unspecified: Secondary | ICD-10-CM | POA: Diagnosis not present

## 2020-11-19 DIAGNOSIS — Z23 Encounter for immunization: Secondary | ICD-10-CM

## 2020-11-19 DIAGNOSIS — E1122 Type 2 diabetes mellitus with diabetic chronic kidney disease: Secondary | ICD-10-CM | POA: Diagnosis not present

## 2020-11-19 DIAGNOSIS — N401 Enlarged prostate with lower urinary tract symptoms: Secondary | ICD-10-CM

## 2020-11-19 DIAGNOSIS — I152 Hypertension secondary to endocrine disorders: Secondary | ICD-10-CM | POA: Diagnosis not present

## 2020-11-19 DIAGNOSIS — N1831 Chronic kidney disease, stage 3a: Secondary | ICD-10-CM

## 2020-11-19 DIAGNOSIS — M51369 Other intervertebral disc degeneration, lumbar region without mention of lumbar back pain or lower extremity pain: Secondary | ICD-10-CM

## 2020-11-19 DIAGNOSIS — E1159 Type 2 diabetes mellitus with other circulatory complications: Secondary | ICD-10-CM | POA: Diagnosis not present

## 2020-11-19 DIAGNOSIS — R338 Other retention of urine: Secondary | ICD-10-CM | POA: Diagnosis not present

## 2020-11-19 DIAGNOSIS — Z794 Long term (current) use of insulin: Secondary | ICD-10-CM

## 2020-11-19 DIAGNOSIS — M5136 Other intervertebral disc degeneration, lumbar region: Secondary | ICD-10-CM | POA: Diagnosis not present

## 2020-11-19 DIAGNOSIS — F250 Schizoaffective disorder, bipolar type: Secondary | ICD-10-CM

## 2020-11-19 MED ORDER — LISINOPRIL 5 MG PO TABS: 5.0000 mg | ORAL_TABLET | Freq: Every day | ORAL | 1 refills | Status: DC

## 2020-11-19 MED ORDER — GABAPENTIN 300 MG PO CAPS: 300.0000 mg | ORAL_CAPSULE | Freq: Two times a day (BID) | ORAL | 1 refills | Status: DC

## 2020-11-19 MED ORDER — ATORVASTATIN CALCIUM 10 MG PO TABS: 10.0000 mg | ORAL_TABLET | Freq: Every day | ORAL | 1 refills | Status: DC

## 2020-11-19 MED ORDER — TRAMADOL HCL 50 MG PO TABS: 50.0000 mg | ORAL_TABLET | Freq: Every evening | ORAL | 2 refills | Status: DC | PRN

## 2020-11-19 MED ORDER — PANTOPRAZOLE SODIUM 40 MG PO TBEC: 40.0000 mg | DELAYED_RELEASE_TABLET | Freq: Every day | ORAL | 1 refills | Status: DC

## 2020-11-19 NOTE — Progress Notes (Signed)
[facility]icare [facility]

## 2020-11-19 NOTE — Progress Notes (Signed)
Subjective:   [name]. is a 55 y.o. male who presents for Medicare Annual/Subsequent preventive examination.  1 week ago he noticed a rash on his L ear and is unsure what it is. His lower back continues to hurt and he would like a referral to his spine doctor Dr. Vertell Limber for epidural spinal injections as he has had relief in the past. Back hurts really bad and he has difficulty getting out of bed  Depression is uncontrolled and he is currently on psychotropic medications. Sees Psych, Therapist.  Denies suicidal ideations or intents. Diabetes mellitus is managed with endocrine but he endorses noncompliance with a diabetic diet and excessive snacking.  Review of Systems    General: negative for fever, weight loss, appetite change Eyes: no visual symptoms. ENT: no ear symptoms, no sinus tenderness, no nasal congestion or sore throat. Neck: no pain  Respiratory: no wheezing, shortness of breath, cough Cardiovascular: no chest pain, no dyspnea on exertion, no pedal edema, no orthopnea. Gastrointestinal: no abdominal pain, no diarrhea, no constipation Genito-Urinary: no urinary frequency, no dysuria, no polyuria. Hematologic: no bruising Endocrine: no cold or heat intolerance Neurological: no headaches, no seizures, no tremors Musculoskeletal: no joint pains, no joint swelling, positive for back pain Skin: no pruritus, positive for rash. Psychological: Positive for depression and anxiety,         Objective:    Today's Vitals   11/19/20 1525  BP: 129/79  Pulse: 91  SpO2: 97%  Weight: 217 lb 3.2 oz (98.5 kg)  Height: 6\' 3"  (1.905 m)  PainSc: 8    Body mass index is 27.15 kg/m.  Advanced Directives 11/19/2020 01/31/2020 05/14/2019 04/22/2019 09/18/2018 07/20/2018 08/04/2017  Does Patient Have a Medical Advance Directive? Yes Yes Yes Yes No No No  Type of Advance Directive - Living will;Healthcare Power of [place]  Does patient want to make changes to medical advance directive? - No - Patient declined No - Patient declined - - - -  Copy of [place] in Chart? - Yes - validated most recent copy scanned in chart (See row information) No - copy requested - - - -  Would patient like information on creating a medical advance directive? - - No - Patient declined - No - Patient declined - Yes (Inpatient - patient defers creating a medical advance directive at this time)  Pre-existing out of facility DNR order (yellow form or pink MOST form) - - - - - - -    Current Medications (verified) Outpatient Encounter Medications as of 11/19/2020  Medication Sig   alfuzosin (UROXATRAL) 10 MG 24 hr tablet Take 10 mg by mouth at bedtime.    amoxicillin (AMOXIL) 875 MG tablet Take 875 mg by mouth 2 (two) times daily.   ARIPiprazole (ABILIFY) 5 MG tablet TAKE 1 TABLET BY MOUTH DAILY FOR DEPRESSION (Patient taking differently: Take 5 mg by mouth daily.)   atorvastatin (LIPITOR) 10 MG tablet TAKE 1 TABLET BY MOUTH AT  BEDTIME   buPROPion (WELLBUTRIN XL) 300 MG 24 hr tablet Take 1 tablet (300 mg total) by mouth daily.   busPIRone (BUSPAR) 10 MG tablet Take 10 mg by mouth 2 (two) times daily.   busPIRone (BUSPAR) 15 MG tablet Take 15 mg by mouth 2 (two) times daily.    chlorhexidine (PERIDEX) 0.12 % solution SMARTSIG:By Mouth   ciclopirox (PENLAC) 8 % solution Apply topically at bedtime. Apply over  nail and surrounding skin. Apply daily over previous coat. After seven (7) days, may remove with alcohol and continue cycle.   clonazePAM (KLONOPIN) 0.5 MG tablet Take 0.5 mg by mouth 2 (two) times daily as needed.   Continuous Blood Gluc Receiver (FREESTYLE LIBRE READER) DEVI Use as directed   Continuous Blood Gluc Sensor (FREESTYLE LIBRE 14 DAY SENSOR) MISC Use as directed   divalproex (DEPAKOTE ER) 500 MG 24 hr tablet Take 500 mg by mouth daily.   fenofibrate (TRICOR) 145 MG tablet Take 1 tablet (145 mg  total) by mouth daily.   FLUARIX QUADRIVALENT 0.5 ML injection    fluticasone (FLONASE) 50 MCG/ACT nasal spray Place 1 spray into both nostrils daily.   gabapentin (NEURONTIN) 300 MG capsule Take 1 capsule (300 mg total) by mouth 2 (two) times daily.   glucose blood (ACCU-CHEK GUIDE) test strip CHECK SUGAR FOUR TIMES DAILY   insulin aspart (NOVOLOG FLEXPEN) 100 UNIT/ML FlexPen Inject 5 Units into the skin daily with breakfast. And pen needles 2/day   insulin glargine (LANTUS SOLOSTAR) 100 UNIT/ML Solostar Pen Inject 45 Units into the skin every morning. And pen needles 1/day   Insulin Pen Needle (B-D UF III MINI PEN NEEDLES) 31G X 5 MM MISC USE FOUR TIMES DAILY   lisinopril (ZESTRIL) 5 MG tablet Take 1 tablet (5 mg total) by mouth daily.   mupirocin ointment (BACTROBAN) 2 % Apply 1 application topically 2 (two) times daily.   nicotine (NICODERM CQ) 14 mg/24hr patch Place 1 patch (14 mg total) onto the skin daily. Then 7 mg/24 hr patch for 1 month   oxyCODONE-acetaminophen (PERCOCET/ROXICET) 5-325 MG tablet Take by mouth.   pantoprazole (PROTONIX) 40 MG tablet TAKE 1 TABLET BY MOUTH  DAILY   QUEtiapine (SEROQUEL) 400 MG tablet Take 400 mg by mouth as needed.   tadalafil (CIALIS) 5 MG tablet TAKE ONE TABLET BY MOUTH EVERY DAY FOR BPH   Testosterone (ANDROGEL) 20.25 MG/1.25GM (1.62%) GEL Apply 20.25 mg topically every morning. Apply topically under arm pit alternating with each application.   Testosterone 1.62 % GEL    tiZANidine (ZANAFLEX) 4 MG tablet TAKE 1 TABLET BY MOUTH  EVERY 8 HOURS AS NEEDED FOR MUSCLE SPASM(S)   traMADol (ULTRAM) 50 MG tablet TAKE 1 TABLET BY MOUTH AT  BEDTIME AS NEEDED   traZODone (DESYREL) 150 MG tablet Take 1 tablet (150 mg total) by mouth at bedtime.   varenicline (CHANTIX) 1 MG tablet TAKE 1 TABLET BY MOUTH  TWICE DAILY   divalproex (DEPAKOTE) 500 MG DR tablet Take 3 tablets (1,500 mg total) by mouth at bedtime.   No facility-administered encounter medications on  file as of 11/19/2020.    Allergies (verified) Vicodin [hydrocodone-acetaminophen]   History: Past Medical History:  Diagnosis Date   ADD (attention deficit disorder)    Anxiety    Arthritis    right hip   Bipolar 1 disorder (HCC)    Blood in urine    CKD (chronic kidney disease), stage III (HCC)    Creatinine elevation    Dementia ([place])    "early onset" (08/04/2017)   Depression    bipolar [facility]   Diabetes mellitus without complication ([place])    Family history of anesthesia complication    pt is unsure , but pt father may have been difficult to arouse    HCAP (healthcare-associated pneumonia) 10/31/2012   History of kidney stones    Hypertension    Hypogonadism male    Liver fatty degeneration  Microscopic hematuria    hereditary s/p Urology eval   Neuromuscular disorder [facility])    feet neuropathy    Osteoarthritis of right hip 11/28/2011   2012 2015 s/p THR Severe  Dr Novella Olive     Pleural effusion 11/02/2012   Pneumonia 10-2012   Pneumonia, organism unspecified([place]) 11/02/2012   Polysubstance dependence, non-opioid, in remission ([place])    remote   Primary osteoarthritis of left hip 05/22/2015   PTSD (post-traumatic stress disorder)    SOCIAL ANXIETY DISORDER    Schizoaffective disorder ([place])    Substance abuse ([place])    Suicide attempt by multiple drug overdose ([place]) 17-Jan-2016   Grieving his cat's death 2015/08/03   Past Surgical History:  Procedure Laterality Date   BACK SURGERY     CLOSED REDUCTION METACARPAL WITH PERCUTANEOUS PINNING Right    LUMBAR [place] HIP ARTHROPLASTY Right 08/16/2013   Procedure: TOTAL HIP ARTHROPLASTY ANTERIOR APPROACH;  Surgeon: Hessie Dibble, MD;  Location: [place];  Service: Orthopedics;  Laterality: Right;   TOTAL HIP ARTHROPLASTY Left 05/22/2015   Procedure: TOTAL HIP ARTHROPLASTY ANTERIOR APPROACH;  Surgeon: Melrose Nakayama, MD;  Location: [place];  Service: Orthopedics;  Laterality: Left;   Family  History  Problem Relation Age of Onset   Diabetes Father    Cancer Mother        died of melanoma with mets   Cervical cancer Sister    Diabetes Sister    Other Neg Hx        hypogonadism   Colon cancer Neg Hx    Colon polyps Neg Hx    Esophageal cancer Neg Hx    Rectal cancer Neg Hx    Stomach cancer Neg Hx    Social History   Socioeconomic History   Marital status: Single    Spouse name: Not on file   Number of children: 0   Years of education: Not on file   Highest education level: Some college, no degree  Occupational History   Occupation: disability  Tobacco Use   Smoking status: Former    Years: 0.25    Types: Cigarettes    Quit date: 03/22/2019    Years since quitting: 1.6   Smokeless tobacco: Never  Vaping Use   Vaping Use: Never used  Substance and Sexual Activity   Alcohol use: Not Currently   Drug use: No    Comment: hx of marijuana/cocaine/crack use but sober since 08/03/2022   Sexual activity: Not Currently  Other Topics Concern   Not on file  Social History Narrative   09/14/19 lives alone, sister [name] helps with meds, he has some in home care, lived with sister until Nov 2020   Caffeine- sodas, amount  varies   regular exercise-no   Social Determinants of Health   Financial Resource Strain: Not on file  Food Insecurity: Not on file  Transportation Needs: Not on file  Physical Activity: Not on file  Stress: Not on file  Social Connections: Not on file    Tobacco Counseling Counseling given: Not Answered   Clinical Intake:  Pre-visit preparation completed: No  Pain : 0-10 Pain Score: 8  Pain Location: Back Pain Orientation: Lower     Diabetes: Yes CBG done?: No       Interpreter Needed?: No      Activities of Daily Living In your present state of health, do you have any difficulty performing the following activities: 11/19/2020  Hearing? Y  Vision? N  Difficulty concentrating or making decisions? Y  Walking or climbing stairs?  Y  Dressing or bathing? Y  Doing errands, shopping? Y  Preparing Food and eating ? Y  Using the Toilet? Y  In the past six months, have you accidently leaked urine? Y  Do you have problems with loss of bowel control? N  Managing your Medications? N  Managing your Finances? N  Housekeeping or managing your Housekeeping? N  Some recent data might be hidden    Patient Care Team: [name], MD as PCP - General (Family Medicine) Erline Levine, MD as Attending Physician (Neurosurgery)  Indicate any recent Medical Services you may have received from other than [facility] providers in the past year (date may be approximate).  Constitutional: normal appearing,  Eyes: PERRLA HEENT: Head is atraumatic, normal sinuses, normal oropharynx, normal appearing tonsils and palate, tympanic membrane is normal bilaterally. Neck: normal range of motion, no thyromegaly, no JVD Cardiovascular: normal rate and rhythm, normal heart sounds, no murmurs, rub or gallop, no pedal edema Respiratory: Normal breath sounds, clear to auscultation bilaterally, no wheezes, no rales, no rhonchi Abdomen: soft, not tender to palpation, normal bowel sounds, no enlarged organs Musculoskeletal: Lumbar spine tenderness.  Positive straight leg raise bilaterally, abnormal gait Skin: Area of dry skin on the left upper ear Neurological: alert, oriented x3 Psychological: normal mood.     Assessment:   This is a routine wellness examination for [place].  Hearing/Vision screen Vision Screening   Right eye Left eye Both eyes  Without correction 20/40 20/40   With correction 20/25 20/30     Dietary issues and exercise activities discussed:     Goals Addressed   None    Depression Screen PHQ 2/9 Scores 11/19/2020 05/11/2020 02/29/2020 11/01/2019 10/05/2019 08/25/2019 05/24/2019  PHQ - 2 Score 6 6 6 6 5 5 4   PHQ- 9 Score 25 22 24 24 21 25 13     Fall Risk Fall Risk  11/19/2020 02/29/2020 11/21/2019 11/01/2019 09/14/2019  Falls in the  past year? 1 1 1  0 1  Number falls in past yr: 0 1 1 - 1  Injury with Fall? 0 1 1 - 1  Comment - - - - fractures  Risk Factor Category  - - - - -  Risk for fall due to : - - History of fall(s) No Fall Risks -  Follow up - - - - -    FALL RISK PREVENTION PERTAINING TO THE HOME:  Any stairs in or around the home? No  If so, are there any without handrails? No  Home free of loose throw rugs in walkways, pet beds, electrical cords, etc? Yes  Adequate lighting in your home to reduce risk of falls? Yes   ASSISTIVE DEVICES UTILIZED TO PREVENT FALLS:  Life alert? Yes  Use of a cane, walker or w/c? Yes  Grab bars in the bathroom? No  Shower chair or bench in shower? Yes  Elevated toilet seat or a handicapped toilet? No   TIMED UP AND GO:  Was the test performed? No .  Length of time to ambulate 10 feet: 30 sec.   Gait slow and steady with assistive device  Cognitive Function: MMSE - Mini Mental State Exam 09/14/2019  Orientation to time 5  Orientation to Place 4  Registration 3  Attention/ Calculation 0  Recall 2  Language- name 2 objects 2  Language- repeat 1  Language- follow 3 step command 3  Language- read & follow direction 1  Write a sentence 1  Copy design 0  Total score 22   Montreal Cognitive Assessment  03/19/2017  Visuospatial/ Executive (0/5) 3  Naming (0/3) 2  Attention: Read list of digits (0/2) 1  Attention: Read list of letters (0/1) 1  Attention: Serial 7 subtraction starting at 100 (0/3) 1  Language: Repeat phrase (0/2) 0  Language : Fluency (0/1) 0  Abstraction (0/2) 1  Delayed Recall (0/5) 1  Orientation (0/6) 6  Total 16  Adjusted Score (based on education) 16      Immunizations Immunization History  Administered Date(s) Administered   Influenza Split 02/17/2011, 04/04/2020   Influenza Whole 03/07/2004, 01/18/2010   Influenza, Seasonal, Injecte, Preservative Fre 06/10/2012   Influenza,inj,Quad PF,6+ Mos 06/13/2013, 01/01/2015, 01/30/2019    Influenza,inj,quad, With Preservative 01/09/2016   Influenza-Unspecified 01/04/2014, 01/10/2016   Moderna Sars-Covid-2 Vaccination 06/01/2019, 06/29/2019   PFIZER Comirnaty([place])Covid-19 Tri-Sucrose Vaccine 04/04/2020   Pneumococcal Conjugate-13 04/18/2015   Pneumococcal Polysaccharide-23 10/31/2012   Tdap 11/30/2014     Screening Tests Health Maintenance  Topic Date Due   OPHTHALMOLOGY EXAM  Never done   Zoster Vaccines- Shingrix (1 of 2) Never done   Pneumococcal Vaccine 22-59 Years old (3 - PPSV23 or PCV20) 10/31/2017   COVID-19 Vaccine (4 - Booster) 07/03/2020   INFLUENZA VACCINE  11/19/2020   HEMOGLOBIN A1C  05/17/2021   FOOT EXAM  11/14/2021   COLONOSCOPY (Pts 45-95yrs Insurance coverage will need to be confirmed)  03/22/2023   TETANUS/TDAP  11/29/2024   PNEUMOCOCCAL POLYSACCHARIDE VACCINE AGE 63-64 HIGH RISK  Completed   Hepatitis C Screening  Completed   HIV Screening  Completed   HPV VACCINES  Aged Out    Health Maintenance  Health Maintenance Due  Topic Date Due   OPHTHALMOLOGY EXAM  Never done   Zoster Vaccines- Shingrix (1 of 2) Never done   Pneumococcal Vaccine 22-70 Years old (3 - PPSV23 or PCV20) 10/31/2017   COVID-19 Vaccine (4 - Booster) 07/03/2020   INFLUENZA VACCINE  11/19/2020    Colorectal cancer screening: Type of screening: Colonoscopy. Completed 03/2020. Repeat every 10 years .   Dental Screening: Recommended annual dental exams for proper oral hygiene  Community Resource Referral / Chronic Care Management: CRR required this visit?  No   CCM required this visit?  No      Plan:   1. Encounter for Medicare annual wellness exam Counseled on 150 minutes of exercise per week, healthy eating (including decreased daily intake of saturated fats, cholesterol, added sugars, sodium), routine healthcare maintenance.   2. Type 2 diabetes mellitus with stage 3a chronic kidney disease, with long-term current use of insulin (HCC) Uncontrolled with  A1c of 8.1 Diabetic regimen adjusted by endocrinologist at recent visit Continue diabetic diet - atorvastatin (LIPITOR) 10 MG tablet; Take 1 tablet (10 mg total) by mouth at bedtime.  Dispense: 90 tablet; Refill: 1  3. Degenerative disc disease, lumbar Uncontrolled - Ambulatory referral to Spine Surgery - Drug Screen 12+Alcohol+CRT, Ur - gabapentin (NEURONTIN) 300 MG capsule; Take 1 capsule (300 mg total) by mouth 2 (two) times daily.  Dispense: 180 capsule; Refill: 1 - traMADol (ULTRAM) 50 MG tablet; Take 1 tablet (50 mg total) by mouth at bedtime as needed.  Dispense: 30 tablet; Refill: 2  4. Hypertension associated with diabetes ([place]) Controlled Counseled on blood pressure goal of less than 130/80, low-sodium, DASH diet, medication compliance, 150 minutes of moderate intensity exercise per week. Discussed medication compliance,  adverse effects. - lisinopril (ZESTRIL) 5 MG tablet; Take 1 tablet (5 mg total) by mouth daily.  Dispense: 90 tablet; Refill: 1  5. Benign prostatic hyperplasia with urinary retention Stable Currently on Flomax Followed by urology  6. Testosterone deficiency Currently on testosterone replacement - PSA, total and free  7. Schizoaffective disorder, bipolar type ([place]) Uncontrolled with elevated PHQ-9 He is not in crisis at this time Advised to discuss with his psychiatrist that his conditions are under control for optimization of management.   I have personally reviewed and noted the following in the patient's chart:   Medical and social history Use of alcohol, tobacco or illicit drugs  Current medications and supplements including opioid prescriptions. Patient is currently taking opioid prescriptions. Information provided to patient regarding non-opioid alternatives. Patient advised to discuss non-opioid treatment plan with their provider. Functional ability and status Nutritional status Physical activity Advanced directives List of other  physicians Hospitalizations, surgeries, and ER visits in previous 12 months Vitals Screenings to include cognitive, depression, and falls Referrals and appointments  In addition, I have reviewed and discussed with patient certain preventive protocols, quality metrics, and best practice recommendations. A written personalized care plan for preventive services as well as general preventive health recommendations were provided to patient.     [name], MD   11/19/2020

## 2020-11-19 NOTE — Patient Instructions (Signed)
  [name] , Thank you for taking time to come for your Medicare Wellness Visit. I appreciate your ongoing commitment to your health goals. Please review the following plan we discussed and let me know if I can assist you in the future.   These are the goals we discussed:  Goals   None     This is a list of the screening recommended for you and due dates:  Health Maintenance  Topic Date Due   Eye exam for diabetics  Never done   Zoster (Shingles) Vaccine (1 of 2) Never done   Pneumococcal Vaccination (3 - PPSV23 or PCV20) 10/31/2017   COVID-19 Vaccine (4 - Booster) 07/03/2020   Flu Shot  11/19/2020   Hemoglobin A1C  05/17/2021   Complete foot exam   11/14/2021   Colon Cancer Screening  03/22/2023   Tetanus Vaccine  11/29/2024   Pneumococcal vaccine  Completed   Hepatitis C Screening: USPSTF Recommendation to screen - Ages 18-79 yo.  Completed   HIV Screening  Completed   HPV Vaccine  Aged Out

## 2020-11-20 LAB — PSA, TOTAL AND FREE
PSA, Free Pct: 31.3 %
PSA, Free: 0.72 ng/mL
Prostate Specific Ag, Serum: 2.3 ng/mL (ref 0.0–4.0)

## 2020-11-24 LAB — DRUG SCREEN 12+ALCOHOL+CRT, UR
Amphetamines, Urine: NEGATIVE ng/mL
BENZODIAZ UR QL: NEGATIVE ng/mL
Barbiturate: NEGATIVE ng/mL
Cannabinoids: NEGATIVE
Cocaine (Metabolite): NEGATIVE ng/mL
Creatinine, Urine: 86.4 mg/dL (ref 20.0–300.0)
Ethanol, Urine: NEGATIVE %
Meperidine: NEGATIVE ng/mL
Methadone: NEGATIVE ng/mL
OPIATE SCREEN URINE: NEGATIVE ng/mL
Oxycodone/Oxymorphone, Urine: NEGATIVE ng/mL
Phencyclidine: NEGATIVE ng/mL
Propoxyphene: NEGATIVE ng/mL
Tramadol: POSITIVE — AB

## 2020-11-29 DIAGNOSIS — E1165 Type 2 diabetes mellitus with hyperglycemia: Secondary | ICD-10-CM | POA: Diagnosis not present

## 2020-11-30 ENCOUNTER — Other Ambulatory Visit: Payer: Self-pay | Admitting: Family Medicine

## 2020-11-30 DIAGNOSIS — E781 Pure hyperglyceridemia: Secondary | ICD-10-CM

## 2020-11-30 DIAGNOSIS — E349 Endocrine disorder, unspecified: Secondary | ICD-10-CM

## 2020-11-30 NOTE — Telephone Encounter (Signed)
Requested medication (s) are due for refill today: no  Requested medication (s) are on the active medication list: yes   Last refill:  11/11/2020  Future visit scheduled: yes   Notes to clinic:  Medication not assigned to a protocol, review manually.   Requested Prescriptions  Pending Prescriptions Disp Refills   Testosterone 20.25 MG/ACT (1.62%) GEL [Pharmacy Med Name: TESTOSTERONE GEL PUMP 1.62% (ANDROGEL)] 75 g     Sig: APPLY TOPICALLY 1 PUMP IN  THE MORNING UNDER ARM PIT  ALTERNATING WITH EACH  APPLICATION     Off-Protocol Failed - 11/30/2020  2:39 PM      Failed - Medication not assigned to a protocol, review manually.      Passed - Valid encounter within last 12 months    Recent Outpatient Visits           1 week ago Encounter for [employer] annual wellness exam   [place], [place], MD   4 months ago Hypertriglyceridemia   [place], Charlane Ferretti, MD   6 months ago Type 2 diabetes mellitus with stage 3a chronic kidney disease, with long-term current use of insulin ([place])   [place], Neoma Laming B, MD   9 months ago Hypertension in stage 3 chronic kidney disease due to type 2 diabetes mellitus [facility])   [place] [facility] [name], MD   1 year ago Essential hypertension   [place], [place]ong, MD       Future Appointments             In 5 months [name], MD [place]            Signed Prescriptions Disp Refills   varenicline (CHANTIX) 1 MG tablet 56 tablet 0    Sig: TAKE 1 TABLET BY MOUTH  TWICE DAILY     Psychiatry:  Drug Dependence Therapy Passed - 11/30/2020  2:39 PM      Passed - Valid encounter within last 12 months    Recent Outpatient Visits           1 week ago Encounter for [employer] annual wellness exam   [place], [place], MD   4 months ago Hypertriglyceridemia   [place], Charlane Ferretti, MD   6 months ago Type 2 diabetes mellitus with stage 3a chronic kidney disease, with long-term current use of insulin ([place])   [place], Neoma Laming B, MD   9 months ago Hypertension in stage 3 chronic kidney disease due to type 2 diabetes mellitus [facility])   [place] [facility] [name], MD   1 year ago Essential hypertension   [place], Charlane Ferretti, MD       Future Appointments             In 5 months [name], MD [place]             fenofibrate (TRICOR) 145 MG tablet 90 tablet 0    Sig: TAKE 1 TABLET BY MOUTH  DAILY     Cardiovascular:  Antilipid - Fibric Acid Derivatives Failed - 11/30/2020  2:39 PM      Failed - HDL in normal range and within 360 days    HDL  Date Value Ref Range Status  07/06/2020 39 (L) >39 mg/dL Final          Failed - Triglycerides in normal range and within 360 days    Triglycerides  Date Value Ref Range Status  07/06/2020 323 (H) 0 - 149 mg/dL Final          Failed - Cr in normal range and within 180 days    Creatinine, Ser  Date Value Ref Range Status  07/06/2020 1.97 (H) 0.76 - 1.27 mg/dL Final   Creatinine,U  Date Value Ref Range Status  04/18/2015 250.2 mg/dL Final   Creatinine, Urine  Date Value Ref Range Status  10/03/2015 35.87 mg/dL Final          Failed - eGFR in normal range and within 180 days    GFR calc Af Amer  Date Value Ref Range Status  01/04/2020 >60 >60 mL/min Final   GFR calc non Af Amer  Date Value Ref Range Status  01/04/2020 54 (L) >60 mL/min Final   GFR  Date Value Ref Range Status  06/02/2016 33.36 (L) >60.00 mL/min Final   eGFR  Date Value Ref Range Status  07/06/2020 40 (L) >59 mL/min/1.73 Final          Passed - Total  Cholesterol in normal range and within 360 days    Cholesterol, Total  Date Value Ref Range Status  07/06/2020 167 100 - 199 mg/dL Final          Passed - LDL in normal range and within 360 days    LDL Chol Calc (NIH)  Date Value Ref Range Status  07/06/2020 76 0 - 99 mg/dL Final   Direct LDL  Date Value Ref Range Status  06/02/2016 72.0 mg/dL Final    Comment:    Optimal:  <100 mg/dLNear or Above Optimal:  100-129 mg/dLBorderline High:  130-159 mg/dLHigh:  160-189 mg/dLVery High:  >190 mg/dL          Passed - ALT in normal range and within 180 days    ALT  Date Value Ref Range Status  07/06/2020 16 0 - 44 IU/L Final          Passed - AST in normal range and within 180 days    AST  Date Value Ref Range Status  07/06/2020 23 0 - 40 IU/L Final          Passed - Valid encounter within last 12 months    Recent Outpatient Visits           1 week ago Encounter for [employer] annual wellness exam   [place], [place], MD   4 months ago Hypertriglyceridemia   [place], [place], MD   6 months ago Type 2 diabetes mellitus with stage 3a chronic kidney disease, with long-term current use of insulin ([place])   [place], Neoma Laming B, MD   9 months ago Hypertension in stage 3 chronic kidney disease due to type 2 diabetes mellitus ([place])    [facility] [name], MD   1 year ago Essential hypertension   [place], Enobong, MD       Future Appointments             In 5 months [name], MD [place]

## 2020-11-30 NOTE — Telephone Encounter (Signed)
Patient's sister [name] called to let Dr. Loanne Drilling know that Patient's insurance will cover Lispro as an alternative for Novolog

## 2020-12-04 ENCOUNTER — Other Ambulatory Visit: Payer: Self-pay | Admitting: Endocrinology

## 2020-12-04 MED ORDER — INSULIN LISPRO (1 UNIT DIAL) 100 UNIT/ML (KWIKPEN): 5.0000 [IU] | PEN_INJECTOR | Freq: Every day | SUBCUTANEOUS | 11 refills | Status: DC

## 2020-12-07 DIAGNOSIS — N1832 Chronic kidney disease, stage 3b: Secondary | ICD-10-CM | POA: Diagnosis not present

## 2020-12-07 DIAGNOSIS — N189 Chronic kidney disease, unspecified: Secondary | ICD-10-CM | POA: Diagnosis not present

## 2020-12-12 ENCOUNTER — Other Ambulatory Visit: Payer: Self-pay | Admitting: Family Medicine

## 2020-12-12 DIAGNOSIS — E781 Pure hyperglyceridemia: Secondary | ICD-10-CM

## 2020-12-26 DIAGNOSIS — R350 Frequency of micturition: Secondary | ICD-10-CM | POA: Diagnosis not present

## 2020-12-26 DIAGNOSIS — R3913 Splitting of urinary stream: Secondary | ICD-10-CM | POA: Diagnosis not present

## 2020-12-29 DIAGNOSIS — E1165 Type 2 diabetes mellitus with hyperglycemia: Secondary | ICD-10-CM | POA: Diagnosis not present

## 2020-12-30 ENCOUNTER — Other Ambulatory Visit: Payer: Self-pay | Admitting: Family Medicine

## 2020-12-30 ENCOUNTER — Telehealth: Payer: Self-pay

## 2020-12-30 DIAGNOSIS — M5136 Other intervertebral disc degeneration, lumbar region: Secondary | ICD-10-CM

## 2020-12-30 NOTE — Telephone Encounter (Signed)
Called pt to see about getting AWV scheduled. No answer, no VM

## 2020-12-30 NOTE — Telephone Encounter (Signed)
Requested medication (s) are due for refill today: yes  Requested medication (s) are on the active medication list: yes  Last refill:  09/18/20 #90 2 RF  Future visit scheduled: yes  Notes to clinic: med not delegated to NT to RF   Requested Prescriptions  Pending Prescriptions Disp Refills   tiZANidine (ZANAFLEX) 4 MG tablet [Pharmacy Med Name: tiZANidine HCl 4 MG Oral Tablet] 90 tablet 2    Sig: TAKE 1 TABLET BY MOUTH  EVERY 8 HOURS AS NEEDED FOR MUSCLE SPASM(S)     Not Delegated - Cardiovascular:  Alpha-2 Agonists - tizanidine Failed - 12/30/2020  4:05 PM      Failed - This refill cannot be delegated      Passed - Valid encounter within last 6 months    Recent Outpatient Visits           1 month ago Encounter for [employer] annual wellness exam   [place], [place], MD   5 months ago Hypertriglyceridemia   [place], [place], MD   7 months ago Type 2 diabetes mellitus with stage 3a chronic kidney disease, with long-term current use of insulin ([place])   [place], Neoma Laming B, MD   10 months ago Hypertension in stage 3 chronic kidney disease due to type 2 diabetes mellitus [facility])   [facility] [name], MD   1 year ago Essential hypertension   [place], Enobong, MD       Future Appointments             In 4 months [name], MD [place]

## 2021-01-07 ENCOUNTER — Ambulatory Visit: Payer: Medicare Other | Admitting: Dietician

## 2021-01-16 ENCOUNTER — Other Ambulatory Visit: Payer: Self-pay

## 2021-01-16 ENCOUNTER — Ambulatory Visit (INDEPENDENT_AMBULATORY_CARE_PROVIDER_SITE_OTHER): Payer: Medicare Other | Admitting: Podiatry

## 2021-01-16 ENCOUNTER — Encounter: Payer: Self-pay | Admitting: Podiatry

## 2021-01-16 DIAGNOSIS — B351 Tinea unguium: Secondary | ICD-10-CM | POA: Diagnosis not present

## 2021-01-16 DIAGNOSIS — E1142 Type 2 diabetes mellitus with diabetic polyneuropathy: Secondary | ICD-10-CM | POA: Diagnosis not present

## 2021-01-18 ENCOUNTER — Encounter: Payer: Self-pay | Admitting: Podiatry

## 2021-01-18 NOTE — Progress Notes (Signed)
  Subjective:  Patient ID: [name]., male    DOB: 1966/01/18,  MRN: [id number]  [name]. presents to clinic today for preventative diabetic foot care and painful thick toenails that are difficult to trim. Pain interferes with ambulation. Aggravating factors include wearing enclosed shoe gear. Pain is relieved with periodic professional debridement.  He states gabapentin is not really helping with his neuropathy. He is taking 300 mg twice daily.   He states he is a fall risk.    PCP is Jonathan Rakes, MD , and last visit was 11/19/2020. He is also followed by Dr. Loanne Drilling for Endocrinology and last visit was 11/14/2020.  Allergies  Allergen Reactions   Vicodin [Hydrocodone-Acetaminophen] Itching   Review of Systems: Negative except as noted in the HPI. Objective:   Constitutional [name]. is a pleasant 55 y.o. Caucasian male, WD, WN in NAD. AAO x 3.   Vascular Capillary refill time to digits immediate b/l. Palpable pedal pulses b/l LE. Pedal hair present. Lower extremity skin temperature gradient within normal limits. No pain with calf compression b/l. No edema noted b/l lower extremities. No cyanosis or clubbing noted.  Neurologic Normal speech. Oriented to person, place, and time. Protective sensation diminished with 10g monofilament b/l.  Dermatologic Pedal skin with normal turgor, texture and tone b/l lower extremities No open wounds b/l lower extremities No interdigital macerations b/l lower extremities Toenails 1-5 b/l elongated, discolored, dystrophic, thickened, crumbly with subungual debris and tenderness to dorsal palpation.  Orthopedic: Normal muscle strength 5/5 to all lower extremity muscle groups bilaterally. No pain crepitus or joint limitation noted with ROM b/l. No gross bony deformities bilaterally. Utilizes rollator for ambulation assistance.   Radiographs: None Assessment:   1. Dermatophytosis of nail   2. Diabetic peripheral  neuropathy associated with type 2 diabetes mellitus ([name])    Plan:  -Examined patient. -We discussed neuropathy on today. Informed him higher doses of gapapentin might make his drowsy. He is already classified as a fall risk. Advised him to discuss other options for treatment of tneuropathy. . -Continue diabetic foot care principles: inspect feet daily, monitor glucose as recommended by PCP and/or Endocrinologist, and follow prescribed diet per PCP, Endocrinologist and/or dietician. -Patient to continue soft, supportive shoe gear daily. -Toenails 1-5 b/l were debrided in length and girth with sterile nail nippers and dremel without iatrogenic bleeding.  -Patient to report any pedal injuries to medical professional immediately. -Patient/POA to call should there be question/concern in the interim.  Return in about 3 months (around 04/17/2021).  Marzetta Board, DPM

## 2021-01-21 ENCOUNTER — Other Ambulatory Visit: Payer: Self-pay | Admitting: Family Medicine

## 2021-01-21 ENCOUNTER — Ambulatory Visit: Payer: Self-pay | Admitting: *Deleted

## 2021-01-21 NOTE — Telephone Encounter (Signed)
Medication: varenicline (CHANTIX) 1 MG tablet [[id number]] , Continuous Blood Gluc Sensor ([place]) [place] [[id number]]   Has the patient contacted their pharmacy? YES (Agent: If no, request that the patient contact the pharmacy for the refill.) (Agent: If yes, when and what did the pharmacy advise?)  Preferred Pharmacy (with phone number or street name): [facility]  ([place], [facility] [address] [place] [address] Phone: [phone] Fax: [phone] Hours: Not open 24 hours   Has the patient been seen for an appointment in the last year OR does the patient have an upcoming appointment? YES 11/19/20  Agent: Please be advised that RX refills may take up to 3 business days. We ask that you follow-up with your pharmacy.

## 2021-01-21 NOTE — Telephone Encounter (Signed)
Patient is calling to report he is a high fall risk- but recently he has noticed that he is getting dizzy daily. Patient states he gets dizzy when he goes form sitting to standing. This has been a chronic problem for him- but seems to be getting worse. Patient is requesting appointment. First available appointment has been scheduled- but it is not within the disposition and UC has been advised for evaluation. Not sure if patient will go due to his transportation situations.  Reason for Disposition  [1] Dizziness caused by heat exposure, sudden standing, or poor fluid intake AND [2] no improvement after 2 hours of rest and fluids  Answer Assessment - Initial Assessment Questions 1. MECHANISM: "How did the fall happen?"     Shaking, dizzy after walking to look out window 2. DOMESTIC VIOLENCE AND ELDER ABUSE SCREENING: "Did you fall because someone pushed you or tried to hurt you?" If Yes, ask: "Are you safe now?"     no 3. ONSET: "When did the fall happen?" (e.g., minutes, hours, or days ago)     Patient is diabetic and on BP medication- this morning and last night 4. LOCATION: "What part of the body hit the ground?" (e.g., back, buttocks, head, hips, knees, hands, head, stomach)     No injury 5. INJURY: "Did you hurt (injure) yourself when you fell?" If Yes, ask: "What did you injure? Tell me more about this?" (e.g., body area; type of injury; pain severity)"     No injury 6. PAIN: "Is there any pain?" If Yes, ask: "How bad is the pain?" (e.g., Scale 1-10; or mild,  moderate, severe)   - NONE (0): No pain   - MILD (1-3): Doesn't interfere with normal activities    - MODERATE (4-7): Interferes with normal activities or awakens from sleep    - SEVERE (8-10): Excruciating pain, unable to do any normal activities      No pain 7. SIZE: For cuts, bruises, or swelling, ask: "How large is it?" (e.g., inches or centimeters)      no 8. PREGNANCY: "Is there any chance you are pregnant?" "When was your  last menstrual period?"     N/a 9. OTHER SYMPTOMS: "Do you have any other symptoms?" (e.g., dizziness, fever, weakness; new onset or worsening).      Dizziness- mostly when standing 10. CAUSE: "What do you think caused the fall (or falling)?" (e.g., tripped, dizzy spell)       Medications- dizzy when stands  Answer Assessment - Initial Assessment Questions 1. DESCRIPTION: "Describe your dizziness."     Off balance 2. LIGHTHEADED: "Do you feel lightheaded?" (e.g., somewhat faint, woozy, weak upon standing)    Weak upon standing 3. VERTIGO: "Do you feel like either you or the room is spinning or tilting?" (i.e. vertigo)     no 4. SEVERITY: "How bad is it?"  "Do you feel like you are going to faint?" "Can you stand and walk?"   - MILD: Feels slightly dizzy, but walking normally.   - MODERATE: Feels unsteady when walking, but not falling; interferes with normal activities (e.g., school, work).   - SEVERE: Unable to walk without falling, or requires assistance to walk without falling; feels like passing out now.      moderate 5. ONSET:  "When did the dizziness begin?"     5 years- worse now 6. AGGRAVATING FACTORS: "Does anything make it worse?" (e.g., standing, change in head position)     Sitting to standing 7. HEART  RATE: "Can you tell me your heart rate?" "How many beats in 15 seconds?"  (Note: not all patients can do this)       No 8. CAUSE: "What do you think is causing the dizziness?"     Medication? 9. RECURRENT SYMPTOM: "Have you had dizziness before?" If Yes, ask: "When was the last time?" "What happened that time?"     Yes- daily occurrence  10. OTHER SYMPTOMS: "Do you have any other symptoms?" (e.g., fever, chest pain, vomiting, diarrhea, bleeding)       no 11. PREGNANCY: "Is there any chance you are pregnant?" "When was your last menstrual period?"       N/a  Protocols used: Falls and Falling-A-AH, Dizziness - Lightheadedness-A-AH

## 2021-01-22 MED ORDER — FREESTYLE LIBRE 14 DAY SENSOR MISC: 4 refills | Status: DC

## 2021-01-22 MED ORDER — VARENICLINE TARTRATE 1 MG PO TABS: 1.0000 mg | ORAL_TABLET | Freq: Two times a day (BID) | ORAL | 0 refills | Status: DC

## 2021-01-23 ENCOUNTER — Other Ambulatory Visit: Payer: Self-pay | Admitting: Family Medicine

## 2021-01-23 ENCOUNTER — Ambulatory Visit (INDEPENDENT_AMBULATORY_CARE_PROVIDER_SITE_OTHER): Payer: Medicare Other | Admitting: Endocrinology

## 2021-01-23 ENCOUNTER — Other Ambulatory Visit: Payer: Self-pay

## 2021-01-23 VITALS — BP 130/70 | HR 107 | Ht 75.0 in | Wt 207.4 lb

## 2021-01-23 DIAGNOSIS — N1831 Chronic kidney disease, stage 3a: Secondary | ICD-10-CM

## 2021-01-23 DIAGNOSIS — N184 Chronic kidney disease, stage 4 (severe): Secondary | ICD-10-CM | POA: Diagnosis not present

## 2021-01-23 DIAGNOSIS — E1122 Type 2 diabetes mellitus with diabetic chronic kidney disease: Secondary | ICD-10-CM | POA: Diagnosis not present

## 2021-01-23 DIAGNOSIS — Z794 Long term (current) use of insulin: Secondary | ICD-10-CM | POA: Diagnosis not present

## 2021-01-23 MED ORDER — LANTUS SOLOSTAR 100 UNIT/ML ~~LOC~~ SOPN: 40.0000 [IU] | PEN_INJECTOR | SUBCUTANEOUS | 99 refills | Status: DC

## 2021-01-23 NOTE — Progress Notes (Signed)
Subjective:    Patient ID: Jonathan Meckel., male    DOB: 10-Aug-1965, 55 y.o.   MRN: [id number]  HPI Pt returns for f/u of diabetes mellitus:  DM type: Insulin-requiring type 2. Dx'ed: 7846 Complications: PN and stage 4 CRI Therapy: insulin since 08-07-2015 DKA: never Severe hypoglycemia: never Pancreatitis: never Pancreatic imaging: normal on 08-06-13 CT SDOH: Sister [name]) provides hx; rx has been limited by poor memory and frequent falls; Aide visits 10AM-2PM (Mon-Sat).  Pt gives her own insulin. Pt and sister say he sometimes misses the insulin; he eats meals at 10AM, 3PM, and 7PM.  However, pt says he usually eats throughout the day.  Other: none Interval history: Pt says he sometimes skips the Humalog because of fasting hypoglycemia.  I reviewed continuous glucose monitor data.  Glucose varies from 68-230.  It is in general highest at 12N; it is lowest at [place]. Past Medical History:  Diagnosis Date   ADD (attention deficit disorder)    Anxiety    Arthritis    right hip   Bipolar 1 disorder (HCC)    Blood in urine    CKD (chronic kidney disease), stage III ([place])    Creatinine elevation    Dementia ([place])    "early onset" (08/04/2017)   Depression    bipolar [facility]   Diabetes mellitus without complication ([place])    Family history of anesthesia complication    pt is unsure , but pt father may have been difficult to arouse    HCAP (healthcare-associated pneumonia) 10/31/2012   History of kidney stones    Hypertension    Hypogonadism male    Liver fatty degeneration    Microscopic hematuria    hereditary s/p Urology eval   Neuromuscular disorder ([place])    feet neuropathy    Osteoarthritis of right hip 11/28/2011   2012 2015 s/p THR Severe  Dr Novella Olive     Pleural effusion 11/02/2012   Pneumonia 10-2012   Pneumonia, organism unspecified([place]) 11/02/2012   Polysubstance dependence, non-opioid, in remission ([place])    remote   Primary osteoarthritis of left hip  05/22/2015   PTSD (post-traumatic stress disorder)    SOCIAL ANXIETY DISORDER    Schizoaffective disorder ([place])    Substance abuse ([place])    Suicide attempt by multiple drug overdose ([place]) 2016/01/21   Grieving his cat's death 08/07/15    Past Surgical History:  Procedure Laterality Date   BACK SURGERY     CLOSED REDUCTION METACARPAL WITH PERCUTANEOUS PINNING Right    LUMBAR [place] HIP ARTHROPLASTY Right 08/16/2013   Procedure: TOTAL HIP ARTHROPLASTY ANTERIOR APPROACH;  Surgeon: Hessie Dibble, MD;  Location: [facility];  Service: Orthopedics;  Laterality: Right;   TOTAL HIP ARTHROPLASTY Left 05/22/2015   Procedure: TOTAL HIP ARTHROPLASTY ANTERIOR APPROACH;  Surgeon: Melrose Nakayama, MD;  Location: [facility];  Service: Orthopedics;  Laterality: Left;    Social History   Socioeconomic History   Marital status: Single    Spouse name: Not on file   Number of children: 0   Years of education: Not on file   Highest education level: Some college, no degree  Occupational History   Occupation: disability  Tobacco Use   Smoking status: Former    Years: 0.25    Types: Cigarettes    Quit date: 03/22/2019    Years since quitting: 1.8   Smokeless tobacco: Never  Vaping Use  Vaping Use: Never used  Substance and Sexual Activity   Alcohol use: Not Currently   Drug use: No    Comment: hx of marijuana/cocaine/crack use but sober since 20's   Sexual activity: Not Currently  Other Topics Concern   Not on file  Social History Narrative   09/14/19 lives alone, sister [name] helps with meds, he has some in home care, lived with sister until Nov 2020   Caffeine- sodas, amount  varies   regular exercise-no   Social Determinants of Health   Financial Resource Strain: Not on file  Food Insecurity: Not on file  Transportation Needs: Not on file  Physical Activity: Not on file  Stress: Not on file  Social Connections: Not on file  Intimate Partner Violence: Not on file     Current Outpatient Medications on File Prior to Visit  Medication Sig Dispense Refill   alfuzosin (UROXATRAL) 10 MG 24 hr tablet Take 10 mg by mouth at bedtime.      ARIPiprazole (ABILIFY) 5 MG tablet TAKE 1 TABLET BY MOUTH DAILY FOR DEPRESSION (Patient taking differently: Take 5 mg by mouth daily.) 30 tablet 0   atorvastatin (LIPITOR) 10 MG tablet Take 1 tablet (10 mg total) by mouth at bedtime. 90 tablet 1   buPROPion (WELLBUTRIN XL) 300 MG 24 hr tablet Take 1 tablet (300 mg total) by mouth daily. 30 tablet 0   busPIRone (BUSPAR) 10 MG tablet Take 10 mg by mouth 2 (two) times daily.     busPIRone (BUSPAR) 15 MG tablet Take 15 mg by mouth 2 (two) times daily.      chlorhexidine (PERIDEX) 0.12 % solution SMARTSIG:By Mouth     ciclopirox (PENLAC) 8 % solution Apply topically at bedtime. Apply over nail and surrounding skin. Apply daily over previous coat. After seven (7) days, may remove with alcohol and continue cycle. 6.6 mL 2   clonazePAM (KLONOPIN) 0.5 MG tablet Take 0.5 mg by mouth 2 (two) times daily as needed.     Continuous Blood Gluc Receiver (FREESTYLE LIBRE READER) DEVI Use as directed 1 each each   Continuous Blood Gluc Sensor (FREESTYLE LIBRE 14 DAY SENSOR) MISC Use as directed 2 each 4   divalproex (DEPAKOTE ER) 500 MG 24 hr tablet Take 500 mg by mouth daily.     fenofibrate (TRICOR) 145 MG tablet TAKE 1 TABLET BY MOUTH  DAILY 90 tablet 0   FLUARIX QUADRIVALENT 0.5 ML injection      fluticasone (FLONASE) 50 MCG/ACT nasal spray Place 1 spray into both nostrils daily. 48 g 3   gabapentin (NEURONTIN) 300 MG capsule Take 1 capsule (300 mg total) by mouth 2 (two) times daily. 180 capsule 1   glucose blood (ACCU-CHEK GUIDE) test strip CHECK SUGAR FOUR TIMES DAILY 100 each 12   insulin lispro (HUMALOG) 100 UNIT/ML KwikPen Inject 5 Units into the skin daily with breakfast. And pen needles 2/day 15 mL 11   Insulin Pen Needle (B-D UF III MINI PEN NEEDLES) 31G X 5 MM MISC USE FOUR TIMES  DAILY 120 each 12   lisinopril (ZESTRIL) 5 MG tablet Take 1 tablet (5 mg total) by mouth daily. 90 tablet 1   mupirocin ointment (BACTROBAN) 2 % Apply 1 application topically 2 (two) times daily. 30 g 2   nicotine (NICODERM CQ) 14 mg/24hr patch Place 1 patch (14 mg total) onto the skin daily. Then 7 mg/24 hr patch for 1 month 28 patch 1   oxyCODONE-acetaminophen (PERCOCET/ROXICET) 5-325 MG tablet Take  by mouth.     pantoprazole (PROTONIX) 40 MG tablet Take 1 tablet (40 mg total) by mouth daily. 90 tablet 1   QUEtiapine (SEROQUEL) 400 MG tablet Take 400 mg by mouth as needed.     tadalafil (CIALIS) 5 MG tablet TAKE ONE TABLET BY MOUTH EVERY DAY FOR BPH 90 tablet 1   Testosterone 1.62 % GEL      Testosterone 20.25 MG/ACT (1.62%) GEL APPLY TOPICALLY 1 PUMP IN  THE MORNING UNDER ARM PIT  ALTERNATING WITH EACH  APPLICATION 75 g 2   tiZANidine (ZANAFLEX) 4 MG tablet TAKE 1 TABLET BY MOUTH  EVERY 8 HOURS AS NEEDED FOR MUSCLE SPASM(S) 90 tablet 2   traMADol (ULTRAM) 50 MG tablet Take 1 tablet (50 mg total) by mouth at bedtime as needed. 30 tablet 2   traZODone (DESYREL) 150 MG tablet Take 1 tablet (150 mg total) by mouth at bedtime. 90 tablet 1   varenicline (CHANTIX) 1 MG tablet Take 1 tablet (1 mg total) by mouth 2 (two) times daily. 56 tablet 0   No current facility-administered medications on file prior to visit.    Allergies  Allergen Reactions   Vicodin [Hydrocodone-Acetaminophen] Itching    Family History  Problem Relation Age of Onset   Diabetes Father    Cancer Mother        died of melanoma with mets   Cervical cancer Sister    Diabetes Sister    Other Neg Hx        hypogonadism   Colon cancer Neg Hx    Colon polyps Neg Hx    Esophageal cancer Neg Hx    Rectal cancer Neg Hx    Stomach cancer Neg Hx     BP 130/70 (BP Location: Right Arm, Patient Position: Sitting, Cuff Size: Normal)   Pulse (!) 107   Ht 6\' 3"  (1.905 m)   Wt 207 lb 6.4 oz (94.1 kg)   SpO2 (!) 65%   BMI  25.92 kg/m   Review of Systems     Objective:   Physical Exam Pulses: dorsalis pedis intact bilat.   MSK: no deformity of the feet CV: no leg edema Skin:  no ulcer on the feet.  normal color and temp on the feet. Neuro: sensation is intact to touch on the feet, but decreased from normal.     Lab Results  Component Value Date   HGBA1C 8.1 (A) 11/14/2020      Assessment & Plan:  Insulin-requiring type 2 DM: uncontrolled.   Hypoglycemia, due to Lantus: goal is to reduce this, so he can take full dose of Humalog.    Patient Instructions  Please reduce the Lantus to 40 units each morning, and continue 5 units of humalog with breakfast.   please call us if your blood sugar goes below 70, or if your blood sugar is mostly over 200.   Please come back for a follow-up appointment in January

## 2021-01-23 NOTE — Patient Instructions (Addendum)
Please reduce the Lantus to 40 units each morning, and continue 5 units of humalog with breakfast.   please call us if your blood sugar goes below 70, or if your blood sugar is mostly over 200.   Please come back for a follow-up appointment in January

## 2021-01-24 NOTE — Telephone Encounter (Signed)
Call to pharmacy- Rx has been filled and mailed-01/22/21

## 2021-01-26 DIAGNOSIS — E119 Type 2 diabetes mellitus without complications: Secondary | ICD-10-CM | POA: Insufficient documentation

## 2021-01-26 DIAGNOSIS — E1169 Type 2 diabetes mellitus with other specified complication: Secondary | ICD-10-CM | POA: Insufficient documentation

## 2021-01-26 DIAGNOSIS — E1122 Type 2 diabetes mellitus with diabetic chronic kidney disease: Secondary | ICD-10-CM | POA: Insufficient documentation

## 2021-01-29 ENCOUNTER — Ambulatory Visit: Payer: Self-pay | Admitting: Family Medicine

## 2021-01-31 DIAGNOSIS — E1165 Type 2 diabetes mellitus with hyperglycemia: Secondary | ICD-10-CM | POA: Diagnosis not present

## 2021-02-01 ENCOUNTER — Other Ambulatory Visit: Payer: Self-pay | Admitting: Family Medicine

## 2021-02-01 DIAGNOSIS — E781 Pure hyperglyceridemia: Secondary | ICD-10-CM

## 2021-02-02 NOTE — Telephone Encounter (Signed)
Requested medications are due for refill today yes  Requested medications are on the active medication list yes  Last refill 12/08/20  Last visit 11/19/20  Future visit scheduled 02/06/21  Notes to clinic labs greater than 180 days ago, please assess.  Requested Prescriptions  Pending Prescriptions Disp Refills   fenofibrate (TRICOR) 145 MG tablet [Pharmacy Med Name: Fenofibrate 145 MG Oral Tablet] 90 tablet 3    Sig: TAKE 1 TABLET BY MOUTH  DAILY     Cardiovascular:  Antilipid - Fibric Acid Derivatives Failed - 02/01/2021 10:04 PM      Failed - HDL in normal range and within 360 days    HDL  Date Value Ref Range Status  07/06/2020 39 (L) >39 mg/dL Final          Failed - Triglycerides in normal range and within 360 days    Triglycerides  Date Value Ref Range Status  07/06/2020 323 (H) 0 - 149 mg/dL Final          Failed - ALT in normal range and within 180 days    ALT  Date Value Ref Range Status  07/06/2020 16 0 - 44 IU/L Final          Failed - AST in normal range and within 180 days    AST  Date Value Ref Range Status  07/06/2020 23 0 - 40 IU/L Final          Failed - Cr in normal range and within 180 days    Creatinine, Ser  Date Value Ref Range Status  07/06/2020 1.97 (H) 0.76 - 1.27 mg/dL Final   Creatinine,U  Date Value Ref Range Status  04/18/2015 250.2 mg/dL Final   Creatinine, Urine  Date Value Ref Range Status  10/03/2015 35.87 mg/dL Final          Failed - eGFR in normal range and within 180 days    GFR calc Af Amer  Date Value Ref Range Status  01/04/2020 >60 >60 mL/min Final   GFR calc non Af Amer  Date Value Ref Range Status  01/04/2020 54 (L) >60 mL/min Final   GFR  Date Value Ref Range Status  06/02/2016 33.36 (L) >60.00 mL/min Final   eGFR  Date Value Ref Range Status  07/06/2020 40 (L) >59 mL/min/1.73 Final          Passed - Total Cholesterol in normal range and within 360 days    Cholesterol, Total  Date Value Ref  Range Status  07/06/2020 167 100 - 199 mg/dL Final          Passed - LDL in normal range and within 360 days    LDL Chol Calc (NIH)  Date Value Ref Range Status  07/06/2020 76 0 - 99 mg/dL Final   Direct LDL  Date Value Ref Range Status  06/02/2016 72.0 mg/dL Final    Comment:    Optimal:  <100 mg/dLNear or Above Optimal:  100-129 mg/dLBorderline High:  130-159 mg/dLHigh:  160-189 mg/dLVery High:  >190 mg/dL          Passed - Valid encounter within last 12 months    Recent Outpatient Visits           2 months ago Encounter for [employer] annual wellness exam   [place], Charlane Ferretti, MD   6 months ago Hypertriglyceridemia   [place], Charlane Ferretti, MD   8 months ago Type 2 diabetes mellitus  with stage 3a chronic kidney disease, with long-term current use of insulin [facility])   [place], Neoma Laming B, MD   11 months ago Hypertension in stage 3 chronic kidney disease due to type 2 diabetes mellitus [facility])   [place], Enobong, MD   1 year ago Essential hypertension   [place], Enobong, MD       Future Appointments             In 4 days [place], Casimer Bilis [place]lo   In 3 months [name], MD [place]

## 2021-02-04 ENCOUNTER — Other Ambulatory Visit: Payer: Self-pay | Admitting: Family Medicine

## 2021-02-04 DIAGNOSIS — M5136 Other intervertebral disc degeneration, lumbar region: Secondary | ICD-10-CM

## 2021-02-04 NOTE — Telephone Encounter (Signed)
Requested medications are due for refill today.  yes  Requested medications are on the active medications list.  yes  Last refill. 11/19/2020  Future visit scheduled.   yes  Notes to clinic.  Medication not delegated. 

## 2021-02-06 ENCOUNTER — Other Ambulatory Visit: Payer: Self-pay

## 2021-02-06 ENCOUNTER — Ambulatory Visit: Payer: Medicare Other | Attending: Physician Assistant | Admitting: Physician Assistant

## 2021-02-06 ENCOUNTER — Encounter: Payer: Self-pay | Admitting: Endocrinology

## 2021-02-06 VITALS — BP 123/78 | HR 104 | Resp 16 | Wt 210.2 lb

## 2021-02-06 DIAGNOSIS — E1122 Type 2 diabetes mellitus with diabetic chronic kidney disease: Secondary | ICD-10-CM

## 2021-02-06 DIAGNOSIS — R630 Anorexia: Secondary | ICD-10-CM

## 2021-02-06 DIAGNOSIS — Z794 Long term (current) use of insulin: Secondary | ICD-10-CM | POA: Diagnosis not present

## 2021-02-06 DIAGNOSIS — R296 Repeated falls: Secondary | ICD-10-CM

## 2021-02-06 DIAGNOSIS — N184 Chronic kidney disease, stage 4 (severe): Secondary | ICD-10-CM | POA: Diagnosis not present

## 2021-02-06 DIAGNOSIS — R413 Other amnesia: Secondary | ICD-10-CM | POA: Diagnosis not present

## 2021-02-06 DIAGNOSIS — R42 Dizziness and giddiness: Secondary | ICD-10-CM

## 2021-02-06 LAB — GLUCOSE, POCT (MANUAL RESULT ENTRY): POC Glucose: 125 mg/dL — AB (ref 70–99)

## 2021-02-06 NOTE — Progress Notes (Signed)
Patient ID: [name]., male   DOB: November 26, 1965, 55 y.o.   MRN: [id number]   [name], is a 55 y.o. male  LYY:[id number]  LEX:[id number]  DOB - [birth year]  Chief Complaint  Patient presents with   Dizziness       Subjective:   [name] is a 55 y.o. male here today for dizziness more than a year that seems to be worsening.  He is also forgetting things and can't keep his train of thought.  He has used a walker for years.  He notices dizziness when he stands up.  He has a history of food restriction anorexia and does admit to me that he is not eating well and usu eats 2 small meals a day.  He has a Social worker and a psychiatrist and sees them regularly.  Colonoscopy up to date  No problems updated.  ALLERGIES: Allergies  Allergen Reactions   Vicodin [Hydrocodone-Acetaminophen] Itching    PAST MEDICAL HISTORY: Past Medical History:  Diagnosis Date   ADD (attention deficit disorder)    Anxiety    Arthritis    right hip   Bipolar 1 disorder (HCC)    Blood in urine    CKD (chronic kidney disease), stage III (HCC)    Creatinine elevation    Dementia ([place])    "early onset" (08/04/2017)   Depression    bipolar [facility]   Diabetes mellitus without complication ([place])    Family history of anesthesia complication    pt is unsure , but pt father may have been difficult to arouse    HCAP (healthcare-associated pneumonia) 10/31/2012   History of kidney stones    Hypertension    Hypogonadism male    Liver fatty degeneration    Microscopic hematuria    hereditary s/p Urology eval   Neuromuscular disorder ([place])    feet neuropathy    Osteoarthritis of right hip 11/28/2011   2012 2015 s/p THR Severe  Dr Novella Olive     Pleural effusion 11/02/2012   Pneumonia 10-2012   Pneumonia, organism unspecified([place]) 11/02/2012   Polysubstance dependence, non-opioid, in remission ([place])    remote   Primary osteoarthritis of left hip 05/22/2015   PTSD (post-traumatic stress disorder)     SOCIAL ANXIETY DISORDER    Schizoaffective disorder ([place])    Substance abuse ([place])    Suicide attempt by multiple drug overdose ([place]) 01-06-2016   Grieving his cat's death 07/23/15    MEDICATIONS AT HOME: Prior to Admission medications   Medication Sig Start Date End Date Taking? Authorizing Provider  alfuzosin (UROXATRAL) 10 MG 24 hr tablet Take 10 mg by mouth at bedtime.  12/31/19   [provider]  ARIPiprazole (ABILIFY) 5 MG tablet TAKE 1 TABLET BY MOUTH DAILY FOR DEPRESSION Patient taking differently: Take 5 mg by mouth daily. 06/23/18   [name], MD  atorvastatin (LIPITOR) 10 MG tablet Take 1 tablet (10 mg total) by mouth at bedtime. 11/19/20   [name], MD  buPROPion (WELLBUTRIN XL) 300 MG 24 hr tablet Take 1 tablet (300 mg total) by mouth daily. 10/13/16   Elgergawy, Silver Huguenin, MD  busPIRone (BUSPAR) 10 MG tablet Take 10 mg by mouth 2 (two) times daily. 07/24/20   [provider]  busPIRone (BUSPAR) 15 MG tablet Take 15 mg by mouth 2 (two) times daily.     [provider]  chlorhexidine (PERIDEX) 0.12 % solution SMARTSIG:By Mouth 09/20/20   [provider]  ciclopirox (PENLAC) 8 %  solution Apply topically at bedtime. Apply over nail and surrounding skin. Apply daily over previous coat. After seven (7) days, may remove with alcohol and continue cycle. 05/29/20   Trula Slade, DPM  clonazePAM (KLONOPIN) 0.5 MG tablet Take 0.5 mg by mouth 2 (two) times daily as needed. 09/11/20   [provider]  Continuous Blood Gluc Receiver (FREESTYLE LIBRE READER) DEVI Use as directed 07/20/20   [name], MD  Continuous Blood Gluc Sensor (FREESTYLE LIBRE 14 DAY SENSOR) MISC Use as directed 01/22/21   [name], MD  divalproex (DEPAKOTE ER) 500 MG 24 hr tablet Take 500 mg by mouth daily. 01/29/20   [provider]  fenofibrate (TRICOR) 145 MG tablet TAKE 1 TABLET BY MOUTH  DAILY 11/30/20   [name], MD  FLUARIX QUADRIVALENT 0.5  ML injection  04/04/20   [provider]  fluticasone (FLONASE) 50 MCG/ACT nasal spray Place 1 spray into both nostrils daily. 07/09/20   [name], MD  gabapentin (NEURONTIN) 300 MG capsule Take 1 capsule (300 mg total) by mouth 2 (two) times daily. 11/19/20   [name], MD  glucose blood (ACCU-CHEK GUIDE) test strip CHECK SUGAR FOUR TIMES DAILY 07/22/18   [name], MD  insulin glargine (LANTUS SOLOSTAR) 100 UNIT/ML Solostar Pen Inject 40 Units into the skin every morning. And pen needles 1/day 01/23/21   Renato Shin, MD  insulin lispro (HUMALOG) 100 UNIT/ML KwikPen Inject 5 Units into the skin daily with breakfast. And pen needles 2/day 12/04/20   Renato Shin, MD  Insulin Pen Needle (B-D UF III MINI PEN NEEDLES) 31G X 5 MM MISC USE FOUR TIMES DAILY 07/27/20   [name], MD  lisinopril (ZESTRIL) 5 MG tablet Take 1 tablet (5 mg total) by mouth daily. 11/19/20   [name], MD  mupirocin ointment (BACTROBAN) 2 % Apply 1 application topically 2 (two) times daily. 05/29/20   Trula Slade, DPM  nicotine (NICODERM CQ) 14 mg/24hr patch Place 1 patch (14 mg total) onto the skin daily. Then 7 mg/24 hr patch for 1 month 05/11/20   Ladell Pier, MD  oxyCODONE-acetaminophen (PERCOCET/ROXICET) 5-325 MG tablet Take by mouth. 08/28/20   [provider]  pantoprazole (PROTONIX) 40 MG tablet Take 1 tablet (40 mg total) by mouth daily. 11/19/20   [name], MD  QUEtiapine (SEROQUEL) 400 MG tablet Take 400 mg by mouth as needed. 01/20/20   [provider]  tadalafil (CIALIS) 5 MG tablet TAKE ONE TABLET BY MOUTH EVERY DAY FOR BPH 07/09/20   [name], MD  Testosterone 1.62 % GEL  04/06/20   [provider]  Testosterone 20.25 MG/ACT (1.62%) GEL APPLY TOPICALLY 1 PUMP IN  THE MORNING UNDER ARM PIT  ALTERNATING WITH EACH  APPLICATION 6/76/72   [name], MD  tiZANidine (ZANAFLEX) 4 MG tablet TAKE 1 TABLET BY MOUTH  EVERY 8 HOURS AS NEEDED  FOR MUSCLE SPASM(S) 12/31/20   [name], MD  traMADol (ULTRAM) 50 MG tablet Take 1 tablet (50 mg total) by mouth at bedtime as needed. 11/19/20   [name], MD  traZODone (DESYREL) 150 MG tablet Take 1 tablet (150 mg total) by mouth at bedtime. 05/11/20   Ladell Pier, MD  varenicline (CHANTIX) 1 MG tablet Take 1 tablet (1 mg total) by mouth 2 (two) times daily. 01/22/21   [name], MD    ROS: Neg HEENT Neg resp Neg cardiac Neg GI Neg GU Neg MS Neg psych  Objective:  Vitals:   02/06/21 1427  BP: 123/78  Pulse: (!) 104  Resp: 16  SpO2: 96%  Weight: 210 lb 3.2 oz (95.3 kg)   Exam General appearance : Awake, alert, not in any distress. Speech Clear. Not toxic looking HEENT: Atraumatic and Normocephalic, pupils equally reactive to light and accomodation Neck: Supple, no JVD. No cervical lymphadenopathy.  Chest: Good air entry bilaterally, CTAB.  No rales/rhonchi/wheezing CVS: S1 S2 regular, no murmurs.  Abdomen: Bowel sounds present, Non tender and not distended with no gaurding, rigidity or rebound. Extremities: B/L Lower Ext shows no edema, both legs are warm to touch Neurology: Awake alert, and oriented X 3, CN II-XII intact, Non focal Skin: No Rash  Data Review Lab Results  Component Value Date   HGBA1C 8.1 (A) 11/14/2020   HGBA1C 8.0 (A) 08/30/2020   HGBA1C 7.5 (H) 07/06/2020    Assessment & Plan   1. Dizziness No red flags on exam.  Eat regular meals and drink adequate water.  This has been going on > 1 year - Comprehensive metabolic panel - CBC with Differential - Vitamin D, 25-hydroxy - Ambulatory referral to Neurology  2. Type 2 diabetes mellitus with stage 4 chronic kidney disease, with long-term current use of insulin (HCC) Followed by endocrine-just saw him earlier this month - Glucose (CBG)  3. Recurrent falls - Ambulatory referral to Neurology  4. Memory loss - Comprehensive metabolic panel - CBC with Differential -  Vitamin D, 25-hydroxy - Ambulatory referral to Neurology  5. Poor appetite Encouraged regular meals as a lot of his symptoms may be caused by improper nutrition and hydration - Comprehensive metabolic panel - CBC with Differential - Vitamin D, 25-hydroxy    Patient have been counseled extensively about nutrition and exercise. Other issues discussed during this visit include: low cholesterol diet, weight control and daily exercise, foot care, annual eye examinations at Ophthalmology, importance of adherence with medications and regular follow-up. We also discussed long term complications of uncontrolled diabetes and hypertension.   Return in about 6 weeks (around 03/20/2021) for see PCP form filled out/chronic conditions.  The patient was given clear instructions to go to ER or return to medical center if symptoms don't improve, worsen or new problems develop. The patient verbalized understanding. The patient was told to call to get lab results if they haven't heard anything in the next week.      Freeman Caldron, PA-C [facility] and [place], [place]   02/06/2021, 3:34 PM

## 2021-02-07 ENCOUNTER — Encounter: Payer: Self-pay | Admitting: Physician Assistant

## 2021-02-07 LAB — CBC WITH DIFFERENTIAL/PLATELET
Basophils Absolute: 0.1 10*3/uL (ref 0.0–0.2)
Basos: 1 %
EOS (ABSOLUTE): 0.1 10*3/uL (ref 0.0–0.4)
Eos: 1 %
Hematocrit: 42.8 % (ref 37.5–51.0)
Hemoglobin: 14.7 g/dL (ref 13.0–17.7)
Immature Grans (Abs): 0 10*3/uL (ref 0.0–0.1)
Immature Granulocytes: 1 %
Lymphocytes Absolute: 2.3 10*3/uL (ref 0.7–3.1)
Lymphs: 40 %
MCH: 30.4 pg (ref 26.6–33.0)
MCHC: 34.3 g/dL (ref 31.5–35.7)
MCV: 89 fL (ref 79–97)
Monocytes Absolute: 0.5 10*3/uL (ref 0.1–0.9)
Monocytes: 9 %
Neutrophils Absolute: 2.8 10*3/uL (ref 1.4–7.0)
Neutrophils: 48 %
Platelets: 151 10*3/uL (ref 150–450)
RBC: 4.83 x10E6/uL (ref 4.14–5.80)
RDW: 13 % (ref 11.6–15.4)
WBC: 5.8 10*3/uL (ref 3.4–10.8)

## 2021-02-07 LAB — COMPREHENSIVE METABOLIC PANEL
ALT: 15 IU/L (ref 0–44)
AST: 23 IU/L (ref 0–40)
Albumin/Globulin Ratio: 1.9 (ref 1.2–2.2)
Albumin: 5.2 g/dL — ABNORMAL HIGH (ref 3.8–4.9)
Alkaline Phosphatase: 52 IU/L (ref 44–121)
BUN/Creatinine Ratio: 10 (ref 9–20)
BUN: 25 mg/dL — ABNORMAL HIGH (ref 6–24)
Bilirubin Total: 0.5 mg/dL (ref 0.0–1.2)
CO2: 25 mmol/L (ref 20–29)
Calcium: 10.1 mg/dL (ref 8.7–10.2)
Chloride: 102 mmol/L (ref 96–106)
Creatinine, Ser: 2.4 mg/dL — ABNORMAL HIGH (ref 0.76–1.27)
Globulin, Total: 2.8 g/dL (ref 1.5–4.5)
Glucose: 85 mg/dL (ref 70–99)
Potassium: 4.7 mmol/L (ref 3.5–5.2)
Sodium: 140 mmol/L (ref 134–144)
Total Protein: 8 g/dL (ref 6.0–8.5)
eGFR: 31 mL/min/{1.73_m2} — ABNORMAL LOW (ref >59)

## 2021-02-07 LAB — VITAMIN D 25 HYDROXY (VIT D DEFICIENCY, FRACTURES): Vit D, 25-Hydroxy: 44.9 ng/mL (ref 30.0–100.0)

## 2021-02-21 ENCOUNTER — Other Ambulatory Visit: Payer: Self-pay | Admitting: Family Medicine

## 2021-02-21 DIAGNOSIS — E1122 Type 2 diabetes mellitus with diabetic chronic kidney disease: Secondary | ICD-10-CM

## 2021-02-21 DIAGNOSIS — I152 Hypertension secondary to endocrine disorders: Secondary | ICD-10-CM

## 2021-02-21 DIAGNOSIS — Z794 Long term (current) use of insulin: Secondary | ICD-10-CM

## 2021-02-21 DIAGNOSIS — M51369 Other intervertebral disc degeneration, lumbar region without mention of lumbar back pain or lower extremity pain: Secondary | ICD-10-CM

## 2021-02-21 DIAGNOSIS — N1831 Chronic kidney disease, stage 3a: Secondary | ICD-10-CM

## 2021-02-21 DIAGNOSIS — M5136 Other intervertebral disc degeneration, lumbar region: Secondary | ICD-10-CM

## 2021-02-21 DIAGNOSIS — E1159 Type 2 diabetes mellitus with other circulatory complications: Secondary | ICD-10-CM

## 2021-02-22 NOTE — Telephone Encounter (Signed)
Requested medications are due for refill today Lisinopril and lipitor just filled 11/1 and 11/2  Requested medications are on the active medication list yes  Last refill 11/1, 11/2  Last visit 11/19/20  Future visit scheduled 03/20/21  Notes to clinic Zanaflex not delegated, both other meds just filled (mail order), please assess.  Requested Prescriptions  Pending Prescriptions Disp Refills   lisinopril (ZESTRIL) 5 MG tablet [Pharmacy Med Name: Lisinopril 5 MG Oral Tablet] 90 tablet 3    Sig: TAKE 1 TABLET BY MOUTH  DAILY     Cardiovascular:  ACE Inhibitors Failed - 02/21/2021  5:43 PM      Failed - Cr in normal range and within 180 days    Creatinine, Ser  Date Value Ref Range Status  02/06/2021 2.40 (H) 0.76 - 1.27 mg/dL Final   Creatinine,U  Date Value Ref Range Status  04/18/2015 250.2 mg/dL Final   Creatinine, Urine  Date Value Ref Range Status  10/03/2015 35.87 mg/dL Final          Passed - K in normal range and within 180 days    Potassium  Date Value Ref Range Status  02/06/2021 4.7 3.5 - 5.2 mmol/L Final          Passed - Patient is not pregnant      Passed - Last BP in normal range    BP Readings from Last 1 Encounters:  02/06/21 123/78          Passed - Valid encounter within last 6 months    Recent Outpatient Visits           2 weeks ago Type 2 diabetes mellitus with stage 4 chronic kidney disease, with long-term current use of insulin ([place])   [place], [place], [place]   3 months ago Encounter for [employer] annual wellness exam   [place], [place], MD   7 months ago Hypertriglyceridemia   [place], [place], MD   9 months ago Type 2 diabetes mellitus with stage 3a chronic kidney disease, with long-term current use of insulin ([place])   [place], Neoma Laming B, MD   11 months ago Hypertension in  stage 3 chronic kidney disease due to type 2 diabetes mellitus ([place])   [place], [place], MD       Future Appointments             In 3 weeks [place], Dionne Bucy, PA-C [place]   In 3 months [place], [place], MD [place]             atorvastatin (LIPITOR) 10 MG tablet [Pharmacy Med Name: Atorvastatin Calcium 10 MG Oral Tablet] 90 tablet 3    Sig: TAKE 1 TABLET BY MOUTH AT  BEDTIME     Cardiovascular:  Antilipid - Statins Failed - 02/21/2021  5:43 PM      Failed - HDL in normal range and within 360 days    HDL  Date Value Ref Range Status  07/06/2020 39 (L) >39 mg/dL Final          Failed - Triglycerides in normal range and within 360 days    Triglycerides  Date Value Ref Range Status  07/06/2020 323 (H) 0 - 149 mg/dL Final          Passed - Total  Cholesterol in normal range and within 360 days    Cholesterol, Total  Date Value Ref Range Status  07/06/2020 167 100 - 199 mg/dL Final          Passed - LDL in normal range and within 360 days    LDL Chol Calc (NIH)  Date Value Ref Range Status  07/06/2020 76 0 - 99 mg/dL Final   Direct LDL  Date Value Ref Range Status  06/02/2016 72.0 mg/dL Final    Comment:    Optimal:  <100 mg/dLNear or Above Optimal:  100-129 mg/dLBorderline High:  130-159 mg/dLHigh:  160-189 mg/dLVery High:  >190 mg/dL          Passed - Patient is not pregnant      Passed - Valid encounter within last 12 months    Recent Outpatient Visits           2 weeks ago Type 2 diabetes mellitus with stage 4 chronic kidney disease, with long-term current use of insulin ([place])   [place], [place], [place]   3 months ago Encounter for [employer] annual wellness exam   [place], [place], MD   7 months ago Hypertriglyceridemia   [place], [place], MD   9 months ago Type 2 diabetes mellitus with stage 3a chronic kidney disease, with long-term current use of insulin ([place])   [place], Neoma Laming B, MD   11 months ago Hypertension in stage 3 chronic kidney disease due to type 2 diabetes mellitus ([place])   [place], Charlane Ferretti, MD       Future Appointments             In 3 weeks [place], Dionne Bucy, PA-C [place]   In 3 months [place], [place], MD [place]             tiZANidine (ZANAFLEX) 4 MG tablet [Pharmacy Med Name: tiZANidine HCl 4 MG Oral Tablet] 90 tablet 2    Sig: TAKE 1 TABLET BY MOUTH  EVERY 8 HOURS AS NEEDED FOR MUSCLE SPASM(S)     Not Delegated - Cardiovascular:  Alpha-2 Agonists - tizanidine Failed - 02/21/2021  5:43 PM      Failed - This refill cannot be delegated      Passed - Valid encounter within last 6 months    Recent Outpatient Visits           2 weeks ago Type 2 diabetes mellitus with stage 4 chronic kidney disease, with long-term current use of insulin [facility])   [place] [employer], [place], [place]   3 months ago Encounter for [employer] annual wellness exam   [place], [place], MD   7 months ago Hypertriglyceridemia   [place], [place], MD   9 months ago Type 2 diabetes mellitus with stage 3a chronic kidney disease, with long-term current use of insulin ([place])   [place], Neoma Laming B, MD   11 months ago Hypertension in stage 3 chronic kidney disease due to type 2 diabetes mellitus [facility])   [place], MD       Future Appointments             In  3 weeks There[place] Solo Dionne Bucy, PA-C [place]   In 3 months [name], MD [place]

## 2021-02-26 ENCOUNTER — Emergency Department (HOSPITAL_BASED_OUTPATIENT_CLINIC_OR_DEPARTMENT_OTHER)
Admission: EM | Admit: 2021-02-26 | Discharge: 2021-02-27 | Disposition: A | Discharge status: Home / Self Care | Payer: Medicare Other | Attending: Emergency Medicine | Admitting: Emergency Medicine

## 2021-02-26 ENCOUNTER — Emergency Department (HOSPITAL_BASED_OUTPATIENT_CLINIC_OR_DEPARTMENT_OTHER): Payer: Medicare Other

## 2021-02-26 ENCOUNTER — Encounter (HOSPITAL_BASED_OUTPATIENT_CLINIC_OR_DEPARTMENT_OTHER): Payer: Self-pay

## 2021-02-26 ENCOUNTER — Other Ambulatory Visit: Payer: Self-pay

## 2021-02-26 ENCOUNTER — Other Ambulatory Visit: Payer: Self-pay | Admitting: Family Medicine

## 2021-02-26 DIAGNOSIS — M549 Dorsalgia, unspecified: Secondary | ICD-10-CM | POA: Diagnosis not present

## 2021-02-26 DIAGNOSIS — G8929 Other chronic pain: Secondary | ICD-10-CM | POA: Insufficient documentation

## 2021-02-26 DIAGNOSIS — M5136 Other intervertebral disc degeneration, lumbar region: Secondary | ICD-10-CM

## 2021-02-26 DIAGNOSIS — Z96643 Presence of artificial hip joint, bilateral: Secondary | ICD-10-CM | POA: Diagnosis not present

## 2021-02-26 DIAGNOSIS — N183 Chronic kidney disease, stage 3 unspecified: Secondary | ICD-10-CM | POA: Diagnosis not present

## 2021-02-26 DIAGNOSIS — W19XXXA Unspecified fall, initial encounter: Secondary | ICD-10-CM

## 2021-02-26 DIAGNOSIS — R531 Weakness: Secondary | ICD-10-CM | POA: Diagnosis not present

## 2021-02-26 DIAGNOSIS — Z79899 Other long term (current) drug therapy: Secondary | ICD-10-CM | POA: Insufficient documentation

## 2021-02-26 DIAGNOSIS — I129 Hypertensive chronic kidney disease with stage 1 through stage 4 chronic kidney disease, or unspecified chronic kidney disease: Secondary | ICD-10-CM | POA: Diagnosis not present

## 2021-02-26 DIAGNOSIS — M545 Low back pain, unspecified: Secondary | ICD-10-CM | POA: Insufficient documentation

## 2021-02-26 DIAGNOSIS — Z794 Long term (current) use of insulin: Secondary | ICD-10-CM | POA: Insufficient documentation

## 2021-02-26 DIAGNOSIS — F0390 Unspecified dementia without behavioral disturbance: Secondary | ICD-10-CM | POA: Insufficient documentation

## 2021-02-26 DIAGNOSIS — E1122 Type 2 diabetes mellitus with diabetic chronic kidney disease: Secondary | ICD-10-CM | POA: Diagnosis not present

## 2021-02-26 DIAGNOSIS — Z87891 Personal history of nicotine dependence: Secondary | ICD-10-CM | POA: Insufficient documentation

## 2021-02-26 MED ORDER — TRAMADOL HCL 50 MG PO TABS
100.0000 mg | ORAL_TABLET | Freq: Once | ORAL | Status: AC
Start: 2021-02-26 — End: 2021-02-26
  Administered 2021-02-26: 100 mg via ORAL
  Filled 2021-02-26: qty 2

## 2021-02-26 NOTE — ED Triage Notes (Signed)
Pt BIB [facility] from home w/mid back pain radiating down into his lower back, increase pain today w/any movement, pt unable to ambulate without assistance. Pt also had multiple falls since last Friday. Pt hx of chronic back pain, numbness and weakness to BLE.

## 2021-02-26 NOTE — Discharge Instructions (Addendum)
Please follow-up with your neurosurgeon as scheduled next week.

## 2021-02-26 NOTE — ED Notes (Signed)
Called hanger for TLSO at 1024pm

## 2021-02-26 NOTE — ED Provider Notes (Signed)
[facility] EMERGENCY DEPT Provider Note   CSN: [id number] Arrival date & time: 02/26/21  1841     History Chief Complaint  Patient presents with   Back Pain    Jonathan Apostol Job Holtsclaw. is a 55 y.o. male with history of chronic back pain and spinal surgery presented ED with worsening lower back pain.  The patient reports that his Rollator slipped out behind him and he fell on his back 3 days ago, landing on his bed on the ground.  He struck his back against a Rollator.  He says he has had significant pain in his lower back since then.  He lives by himself in a single-story house, typically can ambulate with a Rollator.  He is still able to ambulate but reports he has significant pain, particularly when standing up and putting "pressure on my spine".  He is able to lift his legs.  He has chronic neuropathy in both of his feet but no change in loss of sensation.  He takes tramadol as needed once daily for pain and has been using this.  He has a orthopedic or spinal surgery appointment coming up in 1 week.  He lives by himself.  HPI     Past Medical History:  Diagnosis Date   ADD (attention deficit disorder)    Anxiety    Arthritis    right hip   Bipolar 1 disorder (HCC)    Blood in urine    CKD (chronic kidney disease), stage III ([place])    Creatinine elevation    Dementia ([place])    "early onset" (08/04/2017)   Depression    bipolar [facility]   Diabetes mellitus without complication ([place])    Family history of anesthesia complication    pt is unsure , but pt father may have been difficult to arouse    HCAP (healthcare-associated pneumonia) 10/31/2012   History of kidney stones    Hypertension    Hypogonadism male    Liver fatty degeneration    Microscopic hematuria    hereditary s/p Urology eval   Neuromuscular disorder ([place])    feet neuropathy    Osteoarthritis of right hip 11/28/2011   2012 2015 s/p THR Severe  Dr Novella Olive     Pleural effusion 11/02/2012    Pneumonia 10-2012   Pneumonia, organism unspecified([place]) 11/02/2012   Polysubstance dependence, non-opioid, in remission ([place])    remote   Primary osteoarthritis of left hip 05/22/2015   PTSD (post-traumatic stress disorder)    SOCIAL ANXIETY DISORDER    Schizoaffective disorder ([place])    Substance abuse ([place])    Suicide attempt by multiple drug overdose ([place]) 01-12-2016   Grieving his cat's death 07-29-2015    Patient Active Problem List   Diagnosis Date Noted   Diabetes ([place]) 01/26/2021   Tachycardia 08/30/2020   Pressure injury of skin 01/02/2020   Dehydration    Rhabdomyolysis 01/01/2020   Recurrent falls 05/15/2019   AKI (acute kidney injury) ([place]) 05/14/2019   Closed displaced fracture of lateral malleolus of left fibula 08/02/2017   BPH (benign prostatic hyperplasia) 07/24/2017   Hemorrhoid 05/27/2017   Shoulder arthritis 05/27/2017   Diabetic neuropathy ([place]) 05/27/2017   Degenerative disc disease, lumbar 02/04/2017   Constipation    History of seizures 09/20/2016   Acute lower UTI    Urinary retention    HLD (hyperlipidemia) 09/07/2016   GERD (gastroesophageal reflux disease) 09/07/2016   PTSD (post-traumatic stress disorder) 09/07/2016   Fall 09/07/2016   Chronic  kidney disease    Anorexia nervosa, restricting type, in full remission, moderate 01/05/2016   Ataxia 10/14/2015   Hypokalemia 10/03/2015   Hemiparesis ([place])    Dyslipidemia 05/31/2014   Insomnia 11/11/2013   Degenerative joint disease (DJD) of hip 08/16/2013   Acute midline low back pain without sciatica 05/20/2011   Erectile dysfunction 02/17/2011   Constipation - functional 02/17/2011   Hypogonadism male 05/20/2010   Demoralization and apathy 05/20/2010   Schizoaffective disorder, bipolar type ([place]) 01/18/2010   Right shoulder pain 01/18/2010   Anxiety state 12/05/2006   Essential hypertension 12/05/2006    Past Surgical History:  Procedure Laterality Date   BACK SURGERY     CLOSED REDUCTION  METACARPAL WITH PERCUTANEOUS PINNING Right    LUMBAR [place] SURGERY     TONSILLECTOMY     TOTAL HIP ARTHROPLASTY Right 08/16/2013   Procedure: TOTAL HIP ARTHROPLASTY ANTERIOR APPROACH;  Surgeon: Hessie Dibble, MD;  Location: [facility];  Service: Orthopedics;  Laterality: Right;   TOTAL HIP ARTHROPLASTY Left 05/22/2015   Procedure: TOTAL HIP ARTHROPLASTY ANTERIOR APPROACH;  Surgeon: Melrose Nakayama, MD;  Location: [facility];  Service: Orthopedics;  Laterality: Left;       Family History  Problem Relation Age of Onset   Diabetes Father    Cancer Mother        died of melanoma with mets   Cervical cancer Sister    Diabetes Sister    Other Neg Hx        hypogonadism   Colon cancer Neg Hx    Colon polyps Neg Hx    Esophageal cancer Neg Hx    Rectal cancer Neg Hx    Stomach cancer Neg Hx     Social History   Tobacco Use   Smoking status: Former    Years: 0.25    Types: Cigarettes    Quit date: 03/22/2019    Years since quitting: 1.9   Smokeless tobacco: Never  Vaping Use   Vaping Use: Never used  Substance Use Topics   Alcohol use: Not Currently   Drug use: No    Comment: hx of marijuana/cocaine/crack use but sober since 20's    Home Medications Prior to Admission medications   Medication Sig Start Date End Date Taking? Authorizing Provider  traMADol (ULTRAM) 50 MG tablet TAKE 1 TABLET BY MOUTH AT  BEDTIME AS NEEDED 02/07/21   [name], MD  traMADol (ULTRAM) 50 MG tablet Take 1 tablet (50 mg total) by mouth every 8 (eight) hours as needed for up to 15 doses for severe pain. 02/27/21  Yes Wyvonnia Dusky, MD  alfuzosin (UROXATRAL) 10 MG 24 hr tablet Take 10 mg by mouth at bedtime.  12/31/19   [provider]  ARIPiprazole (ABILIFY) 5 MG tablet TAKE 1 TABLET BY MOUTH DAILY FOR DEPRESSION Patient taking differently: Take 5 mg by mouth daily. 06/23/18   [name], MD  atorvastatin (LIPITOR) 10 MG tablet TAKE 1 TABLET BY MOUTH AT  BEDTIME 02/22/21   [name],  MD  buPROPion (WELLBUTRIN XL) 300 MG 24 hr tablet Take 1 tablet (300 mg total) by mouth daily. 10/13/16   Elgergawy, Silver Huguenin, MD  busPIRone (BUSPAR) 10 MG tablet Take 10 mg by mouth 2 (two) times daily. 07/24/20   [provider]  busPIRone (BUSPAR) 15 MG tablet Take 15 mg by mouth 2 (two) times daily.     [provider]  chlorhexidine (PERIDEX) 0.12 % solution SMARTSIG:By Mouth 09/20/20  [provider]  ciclopirox (PENLAC) 8 % solution Apply topically at bedtime. Apply over nail and surrounding skin. Apply daily over previous coat. After seven (7) days, may remove with alcohol and continue cycle. 05/29/20   Trula Slade, DPM  clonazePAM (KLONOPIN) 0.5 MG tablet Take 0.5 mg by mouth 2 (two) times daily as needed. 09/11/20   [provider]  Continuous Blood Gluc Receiver (FREESTYLE LIBRE READER) DEVI Use as directed 07/20/20   [name], MD  Continuous Blood Gluc Sensor (FREESTYLE LIBRE 14 DAY SENSOR) MISC Use as directed 01/22/21   [name], MD  divalproex (DEPAKOTE ER) 500 MG 24 hr tablet Take 500 mg by mouth daily. 01/29/20   [provider]  fenofibrate (TRICOR) 145 MG tablet TAKE 1 TABLET BY MOUTH  DAILY 11/30/20   [name], MD  FLUARIX QUADRIVALENT 0.5 ML injection  04/04/20   [provider]  fluticasone (FLONASE) 50 MCG/ACT nasal spray Place 1 spray into both nostrils daily. 07/09/20   [name], MD  gabapentin (NEURONTIN) 300 MG capsule Take 1 capsule (300 mg total) by mouth 2 (two) times daily. 11/19/20   [name], MD  glucose blood (ACCU-CHEK GUIDE) test strip CHECK SUGAR FOUR TIMES DAILY 07/22/18   [name], MD  insulin glargine (LANTUS SOLOSTAR) 100 UNIT/ML Solostar Pen Inject 40 Units into the skin every morning. And pen needles 1/day 01/23/21   Renato Shin, MD  insulin lispro (HUMALOG) 100 UNIT/ML KwikPen Inject 5 Units into the skin daily with breakfast. And pen needles 2/day 12/04/20   Renato Shin, MD  Insulin Pen Needle (B-D UF III MINI PEN NEEDLES) 31G X 5 MM MISC USE FOUR TIMES DAILY 07/27/20   [name], MD  lisinopril (ZESTRIL) 5 MG tablet TAKE 1 TABLET BY MOUTH  DAILY 02/26/21   [name], MD  mupirocin ointment (BACTROBAN) 2 % Apply 1 application topically 2 (two) times daily. 05/29/20   Trula Slade, DPM  nicotine (NICODERM CQ) 14 mg/24hr patch Place 1 patch (14 mg total) onto the skin daily. Then 7 mg/24 hr patch for 1 month 05/11/20   Ladell Pier, MD  oxyCODONE-acetaminophen (PERCOCET/ROXICET) 5-325 MG tablet Take by mouth. 08/28/20   [provider]  pantoprazole (PROTONIX) 40 MG tablet Take 1 tablet (40 mg total) by mouth daily. 11/19/20   [name], MD  QUEtiapine (SEROQUEL) 400 MG tablet Take 400 mg by mouth as needed. 01/20/20   [provider]  tadalafil (CIALIS) 5 MG tablet TAKE ONE TABLET BY MOUTH EVERY DAY FOR BPH 07/09/20   [name], MD  Testosterone 1.62 % GEL  04/06/20   [provider]  Testosterone 20.25 MG/ACT (1.62%) GEL APPLY TOPICALLY 1 PUMP IN  THE MORNING UNDER ARM PIT  ALTERNATING WITH EACH  APPLICATION 12/07/54   [name], MD  tiZANidine (ZANAFLEX) 4 MG tablet TAKE 1 TABLET BY MOUTH  EVERY 8 HOURS AS NEEDED FOR MUSCLE SPASM(S) 02/22/21   [name], MD  traZODone (DESYREL) 150 MG tablet Take 1 tablet (150 mg total) by mouth at bedtime. 05/11/20   Ladell Pier, MD  varenicline (CHANTIX) 1 MG tablet Take 1 tablet (1 mg total) by mouth 2 (two) times daily. 01/22/21   [name], MD    Allergies    Vicodin [hydrocodone-acetaminophen]  Review of Systems   Review of Systems  Constitutional:  Negative for chills and fever.  Respiratory:  Negative for cough and shortness of breath.   Cardiovascular:  Negative  for chest pain and palpitations.  Gastrointestinal:  Negative for abdominal pain and vomiting.  Genitourinary:  Negative for dysuria and hematuria.  Musculoskeletal:  Positive  for arthralgias and back pain.  Skin:  Negative for color change and rash.  Neurological:  Negative for weakness and numbness.  All other systems reviewed and are negative.  Physical Exam Updated Vital Signs BP 138/86 (BP Location: Right Arm)   Pulse 81   Temp 98.3 F (36.8 C) (Oral)   Resp 18   Ht 6\' 3"  (1.905 m)   Wt 95.3 kg   SpO2 98%   BMI 26.26 kg/m   Physical Exam Constitutional:      General: He is not in acute distress. HENT:     Head: Normocephalic and atraumatic.  Eyes:     Conjunctiva/sclera: Conjunctivae normal.     Pupils: Pupils are equal, round, and reactive to light.  Cardiovascular:     Rate and Rhythm: Normal rate and regular rhythm.  Pulmonary:     Effort: Pulmonary effort is normal. No respiratory distress.  Abdominal:     General: There is no distension.     Tenderness: There is no abdominal tenderness.  Musculoskeletal:     Comments: Midline and paraspinal tenderness, lower back  Skin:    General: Skin is warm and dry.  Neurological:     General: No focal deficit present.     Mental Status: He is alert. Mental status is at baseline.     Comments: Chronic paresthesias unchanged in both feet 5/5 strength with bilateral hip and ankle flexion/extension +straight leg test left side  Psychiatric:        Mood and Affect: Mood normal.        Behavior: Behavior normal.    ED Results / Procedures / Treatments   Labs (all labs ordered are listed, but only abnormal results are displayed) Labs Reviewed - No data to display  EKG None  Radiology CT Lumbar Spine Wo Contrast  Result Date: 02/26/2021 CLINICAL DATA:  Low back pain EXAM: CT LUMBAR SPINE WITHOUT CONTRAST TECHNIQUE: Multidetector CT imaging of the lumbar spine was performed without intravenous contrast administration. Multiplanar CT image reconstructions were also generated. COMPARISON:  MRI 12/01/2019 FINDINGS: Segmentation: 5 lumbar type vertebrae. Alignment: Normal. Vertebrae: No acute  fracture or focal pathologic process. Paraspinal and other soft tissues: No paravertebral or paraspinal soft tissue abnormality. Right kidney stone without hydronephrosis. Aortic atherosclerosis. Disc levels: At T12-L1, maintained disc space. No canal stenosis. The foramen are patent. At L1-L2, mild diffuse disc bulge. No canal stenosis. Facet degenerative changes. Right paracentral calcified material, may correspond to the MRI extrusion. At L2-L3, maintained disc space. Mild diffuse disc bulge. Hypertrophic facet degenerative changes. Mild bilateral foraminal narrowing. At L3-L4, maintained disc space. Diffuse disc bulge. Ligamentum flavum thickening and hypertrophic facet degenerative changes. Canal appears mildly narrowed. There is mild bilateral foraminal narrowing. At L4-L5, posterior fusion hardware.  Canal appears patent. At L5-S1, disc space narrowing. No significant canal stenosis. Facet degenerative change. The foramen appear patent bilaterally. IMPRESSION: 1. Postsurgical changes at L4-L5 with multilevel degenerative changes as previously described. 2. No acute osseous abnormality. Electronically Signed   By: Donavan Foil M.D.   On: 02/26/2021 23:28    Procedures Procedures   Medications Ordered in ED Medications  traMADol (ULTRAM) tablet 100 mg (100 mg Oral Given 02/26/21 2226)    ED Course  I have reviewed the triage vital signs and the nursing notes.  Pertinent labs & imaging  results that were available during my care of the patient were reviewed by me and considered in my medical decision making (see chart for details).  Patient is here with a fall complaining of midline lower back pain.  No new acute neurological deficits or red flags for cauda equina syndrome.  He is able to ambulate, his primary complaint seems to be sitting up and getting up out of bed.  I have ordered a CT scan to evaluate for compression fracture.  Have also ordered a TLSO brace which may help relieve some of his  symptoms.  Tramadol was ordered for pain, which is what he takes at home.  I anticipate if there is no significant or surgical fracture, the patient may be discharged home with tramadol and outpatient neurosurgery follow up.  Clinical Course as of 02/27/21 1135  Tue Feb 26, 2021  2355 IMPRESSION: 1. Postsurgical changes at L4-L5 with multilevel degenerative changes as previously described. 2. No acute osseous abnormality. [MT]  Wed Feb 27, 2021  0002 Patient was fitted with TLSO brace and is able to sit upright in the bed and even take several steps and get up from bed.  It does help his pain somewhat.  He is comfortable going home.  I will provide him a few extra tramadol tablets to help with his pain at home, and he has a follow-up appointment with his neurosurgeons. [MT]    Clinical Course User Index [MT] Langston Masker Carola Rhine, MD     Final Clinical Impression(s) / ED Diagnoses Final diagnoses:  Fall, initial encounter  Chronic bilateral low back pain without sciatica    Rx / DC Orders ED Discharge Orders          Ordered    traMADol (ULTRAM) 50 MG tablet  Every 8 hours PRN        02/27/21 0002             Wyvonnia Dusky, MD 02/27/21 1135

## 2021-02-26 NOTE — ED Triage Notes (Signed)
Pt reports the bar on his rollator fell onto his back knocking him straight back Friday, pt reports he fell again today while using the bathroom, states he lost his balance d/t twisting.

## 2021-02-27 DIAGNOSIS — Z743 Need for continuous supervision: Secondary | ICD-10-CM | POA: Diagnosis not present

## 2021-02-27 DIAGNOSIS — M549 Dorsalgia, unspecified: Secondary | ICD-10-CM | POA: Diagnosis not present

## 2021-02-27 MED ORDER — TRAMADOL HCL 50 MG PO TABS: 50.0000 mg | ORAL_TABLET | Freq: Three times a day (TID) | ORAL | 0 refills | Status: DC | PRN

## 2021-03-01 ENCOUNTER — Other Ambulatory Visit: Payer: Self-pay | Admitting: Family Medicine

## 2021-03-01 NOTE — Telephone Encounter (Signed)
Medication: varenicline (CHANTIX) 1 MG tablet [[id number]]   Has the patient contacted their pharmacy? YES Pharmacy is calling on behalf of the pt (Agent: If no, request that the patient contact the pharmacy for the refill. If patient does not wish to contact the pharmacy document the reason why and proceed with request.) (Agent: If yes, when and what did the pharmacy advise?)   Preferred Pharmacy (with phone number or street name): [facility] ([place], [place] [facility] [address] [place] [address] [place] [address] Phone: [phone] Fax: [phone] Hours: Not open 24 hours Has the patient been seen for an appointment in the last year OR does the patient have an upcoming appointment? YES 02/06/21  Agent: Please be advised that RX refills may take up to 3 business days. We ask that you follow-up with your pharmacy.

## 2021-03-02 DIAGNOSIS — E1165 Type 2 diabetes mellitus with hyperglycemia: Secondary | ICD-10-CM | POA: Diagnosis not present

## 2021-03-02 MED ORDER — VARENICLINE TARTRATE 1 MG PO TABS: 1.0000 mg | ORAL_TABLET | Freq: Two times a day (BID) | ORAL | 2 refills | Status: DC

## 2021-03-02 NOTE — Telephone Encounter (Signed)
Requested Prescriptions  Pending Prescriptions Disp Refills  . varenicline (CHANTIX) 1 MG tablet 60 tablet 2    Sig: Take 1 tablet (1 mg total) by mouth 2 (two) times daily.     Psychiatry:  Drug Dependence Therapy Passed - 03/01/2021  5:33 PM      Passed - Valid encounter within last 12 months    Recent Outpatient Visits          3 weeks ago Type 2 diabetes mellitus with stage 4 chronic kidney disease, with long-term current use of insulin [facility])   [place], [place], [place]   3 months ago Encounter for [employer] annual wellness exam   [place], [place], MD   7 months ago Hypertriglyceridemia   [place], Charlane Ferretti, MD   9 months ago Type 2 diabetes mellitus with stage 3a chronic kidney disease, with long-term current use of insulin ([place])   [place], Neoma Laming B, MD   1 year ago Hypertension in stage 3 chronic kidney disease due to type 2 diabetes mellitus [facility])   [place], Enobong, MD      Future Appointments            In 2 weeks [place], Dionne Bucy, PA-C [place]   In 2 months [name], MD [place]

## 2021-03-07 ENCOUNTER — Ambulatory Visit: Payer: Medicare Other | Admitting: Dietician

## 2021-03-08 ENCOUNTER — Ambulatory Visit: Payer: Self-pay | Admitting: *Deleted

## 2021-03-08 NOTE — Telephone Encounter (Signed)
Reason for Disposition  [1] MODERATE dizziness (e.g., interferes with normal activities) AND [2] has NOT been evaluated by physician for this  (Exception: dizziness caused by heat exposure, sudden standing, or poor fluid intake)  Answer Assessment - Initial Assessment Questions 1. DESCRIPTION: "Describe your dizziness."     2 months now- getting worse 2. LIGHTHEADED: "Do you feel lightheaded?" (e.g., somewhat faint, woozy, weak upon standing)     woozy 3. VERTIGO: "Do you feel like either you or the room is spinning or tilting?" (i.e. vertigo)     yes 4. SEVERITY: "How bad is it?"  "Do you feel like you are going to faint?" "Can you stand and walk?"   - MILD: Feels slightly dizzy, but walking normally.   - MODERATE: Feels unsteady when walking, but not falling; interferes with normal activities (e.g., school, work).   - SEVERE: Unable to walk without falling, or requires assistance to walk without falling; feels like passing out now.      Almost every time he stands- daily occurrence  5. ONSET:  "When did the dizziness begin?"     2 months- worse 6. AGGRAVATING FACTORS: "Does anything make it worse?" (e.g., standing, change in head position)     Sitting to standing 7. HEART RATE: "Can you tell me your heart rate?" "How many beats in 15 seconds?"  (Note: not all patients can do this)       na 8. CAUSE: "What do you think is causing the dizziness?"     Unsure- related to diabetes- low readings 9. RECURRENT SYMPTOM: "Have you had dizziness before?" If Yes, ask: "When was the last time?" "What happened that time?"     Yes- 4 years 10. OTHER SYMPTOMS: "Do you have any other symptoms?" (e.g., fever, chest pain, vomiting, diarrhea, bleeding)       no 11. PREGNANCY: "Is there any chance you are pregnant?" "When was your last menstrual period?"       na  Protocols used: Dizziness - Lightheadedness-A-AH

## 2021-03-08 NOTE — Telephone Encounter (Signed)
Patient is calling to report he is having frequent falls due to dizziness. Patient states he has had this for 4 years- but frrls he is getting worse. Patient reports he feels his falls may be related to glucose lows- he has fallen at night when low- but does not report low reading during the day. Patient advised UC for evaluation- but he feels they will not be helpful. Patient offered appointment next week- but he declines due to transportation and other appointments. Patient states he can only come to office on/after 11/23. Patient advised to keep records of BP/glucose levels at dizzy times and go to UC if needed.

## 2021-03-08 NOTE — Telephone Encounter (Signed)
His Diabetes is managed by Endocrinology - Dr Loanne Drilling. Please advise him to reach out to them as well as his medication regimen might need to be adjusted. Please educate on management of hypoglycemia as well. Thanks

## 2021-03-11 DIAGNOSIS — N1832 Chronic kidney disease, stage 3b: Secondary | ICD-10-CM | POA: Diagnosis not present

## 2021-03-11 DIAGNOSIS — I129 Hypertensive chronic kidney disease with stage 1 through stage 4 chronic kidney disease, or unspecified chronic kidney disease: Secondary | ICD-10-CM | POA: Diagnosis not present

## 2021-03-11 DIAGNOSIS — E1122 Type 2 diabetes mellitus with diabetic chronic kidney disease: Secondary | ICD-10-CM | POA: Diagnosis not present

## 2021-03-11 DIAGNOSIS — D631 Anemia in chronic kidney disease: Secondary | ICD-10-CM | POA: Diagnosis not present

## 2021-03-11 NOTE — Telephone Encounter (Addendum)
Education provided on hypoglycemia prevention- diet and blood sugars. Advised to treat hypoglycemia with HS snack with foods that are protein rich. Advised to keep glucose gel or tablet, OJ, chocolate milk, candy at the bedside. Advised that hypoglycemia should be treated with blood sugar 80 or lower.  Advise to call Dr. Loanne Drilling for DM and medication management.  Patient verbalized understanding.   Given number to [facility]. He states he has not been given an appt.

## 2021-03-20 ENCOUNTER — Ambulatory Visit: Payer: Medicare Other | Admitting: Physician Assistant

## 2021-03-26 ENCOUNTER — Other Ambulatory Visit: Payer: Self-pay | Admitting: Family Medicine

## 2021-03-26 ENCOUNTER — Ambulatory Visit: Payer: Medicare Other | Admitting: Endocrinology

## 2021-03-26 NOTE — Telephone Encounter (Signed)
Requested Prescriptions  Pending Prescriptions Disp Refills  . pantoprazole (PROTONIX) 40 MG tablet [Pharmacy Med Name: Pantoprazole Sodium 40 MG Oral Tablet Delayed Release] 90 tablet 3    Sig: TAKE 1 TABLET BY MOUTH  DAILY     Gastroenterology: Proton Pump Inhibitors Passed - 03/26/2021  6:29 PM      Passed - Valid encounter within last 12 months    Recent Outpatient Visits          1 month ago Type 2 diabetes mellitus with stage 4 chronic kidney disease, with long-term current use of insulin [facility])   [place], [place], [place]   4 months ago Encounter for [employer] annual wellness exam   [place], Susan Moore, MD   8 months ago Hypertriglyceridemia   [place], Ames, MD   10 months ago Type 2 diabetes mellitus with stage 3a chronic kidney disease, with long-term current use of insulin ([place])   [place], Neoma Laming B, MD   1 year ago Hypertension in stage 3 chronic kidney disease due to type 2 diabetes mellitus [facility])   [place], MD      Future Appointments            In 2 months [name], MD [place]

## 2021-04-05 ENCOUNTER — Other Ambulatory Visit: Payer: Self-pay | Admitting: Neurosurgery

## 2021-04-05 DIAGNOSIS — M47816 Spondylosis without myelopathy or radiculopathy, lumbar region: Secondary | ICD-10-CM

## 2021-04-08 ENCOUNTER — Ambulatory Visit: Payer: Self-pay

## 2021-04-08 NOTE — Telephone Encounter (Signed)
Chief Complaint: Back doctor wants me to see cardiologist due to BP readings going up and down Symptoms: None Frequency: N/A Pertinent Negatives: Patient denies symptoms (dizziness) Disposition: [] ED /[] Urgent Care (no appt availability in office) / [] Appointment(In office/virtual)/ []  [facility]/ [x] Home Care/ [] Refused Recommended Disposition  Additional Notes: BP readings Saturday 121/65; Sunday 109/68; Today 108/65. Patient reports that's the normal range of BP readings. He says his back doctor mentioned that his falls could be related to his BP dropping when standing, so he recommends him to see a cardiologist about this. I advised patient once Dr. Margarita Rana reviews this, someone will call with her recommendations.     Summary: Unknown high and low BP readings    Patient was seen by his back doctor and back doctor recommended him seeing a cardiologist due to BP going up and down. Caller does not know the readings but stated you can call the patient directly and he will advise the nurse of the readings and if there are any other symptoms.      Reason for Disposition  [1] Caller requesting NON-URGENT health information AND [2] PCP's office is the best resource  Answer Assessment - Initial Assessment Questions 1. REASON FOR CALL or QUESTION: "What is your reason for calling today?" or "How can I best help you?" or "What question do you have that I can help answer?"     My low BP may be the reason I keep falling  Protocols used: Information Only Call - No Triage-A-AH

## 2021-04-09 NOTE — Telephone Encounter (Signed)
Pt is needing a referral to cardiology per note from back doctor.  Does patient need to come in for office visit before referral can be placed.

## 2021-04-09 NOTE — Telephone Encounter (Signed)
Yes he does.

## 2021-04-10 NOTE — Telephone Encounter (Signed)
Pt has been called and informed of appointment.

## 2021-04-17 ENCOUNTER — Telehealth: Payer: Self-pay | Admitting: Family Medicine

## 2021-04-17 NOTE — Telephone Encounter (Signed)
Copied from [place] [id number]. Topic: General - Call Back - No Documentation >> Apr 17, 2021 12:20 PM [name] wrote: Reason for CRM: Wants felicia to call him back to reschedule Best contact: [phone]

## 2021-04-18 ENCOUNTER — Other Ambulatory Visit: Payer: Self-pay | Admitting: Podiatry

## 2021-04-18 ENCOUNTER — Other Ambulatory Visit: Payer: Self-pay | Admitting: Family Medicine

## 2021-04-18 DIAGNOSIS — E781 Pure hyperglyceridemia: Secondary | ICD-10-CM

## 2021-04-25 ENCOUNTER — Ambulatory Visit: Payer: Medicare Other | Admitting: Dietician

## 2021-04-26 ENCOUNTER — Ambulatory Visit: Payer: Medicare Other | Admitting: Podiatry

## 2021-04-27 ENCOUNTER — Other Ambulatory Visit: Payer: Self-pay | Admitting: Family Medicine

## 2021-04-27 DIAGNOSIS — M5136 Other intervertebral disc degeneration, lumbar region: Secondary | ICD-10-CM

## 2021-04-27 DIAGNOSIS — E781 Pure hyperglyceridemia: Secondary | ICD-10-CM

## 2021-04-28 NOTE — Telephone Encounter (Signed)
Requested medication (s) are due for refill today: yes  Requested medication (s) are on the active medication list: yes  Last refill:  02/07/21 #30 2 RF  Future visit scheduled: yes  Notes to clinic:  med not delegated to NT to RF   Requested Prescriptions  Pending Prescriptions Disp Refills   traMADol (ULTRAM) 50 MG tablet [Pharmacy Med Name: traMADol HCl 50 MG Oral Tablet] 30 tablet     Sig: TAKE 1 TABLET BY MOUTH AT  BEDTIME AS NEEDED     Not Delegated - Analgesics:  Opioid Agonists Failed - 04/27/2021  9:27 PM      Failed - This refill cannot be delegated      Failed - Urine Drug Screen completed in last 360 days      Passed - Valid encounter within last 6 months    Recent Outpatient Visits           2 months ago Type 2 diabetes mellitus with stage 4 chronic kidney disease, with long-term current use of insulin ([place])   [place], [place], [place]   5 months ago Encounter for [employer] annual wellness exam   [place], [place], MD   9 months ago Hypertriglyceridemia   [place], [place], MD   11 months ago Type 2 diabetes mellitus with stage 3a chronic kidney disease, with long-term current use of insulin ([place])   [place], Neoma Laming B, MD   1 year ago Hypertension in stage 3 chronic kidney disease due to type 2 diabetes mellitus ([place])   [place], Charlane Ferretti, MD       Future Appointments             In 4 weeks [name], MD [place]            Signed Prescriptions Disp Refills   fenofibrate (TRICOR) 145 MG tablet 90 tablet 3    Sig: TAKE 1 TABLET BY MOUTH DAILY     Cardiovascular:  Antilipid - Fibric Acid Derivatives Failed - 04/27/2021  9:27 PM      Failed - HDL in normal range and within 360 days    HDL  Date Value Ref Range Status   07/06/2020 39 (L) >39 mg/dL Final          Failed - Triglycerides in normal range and within 360 days    Triglycerides  Date Value Ref Range Status  07/06/2020 323 (H) 0 - 149 mg/dL Final          Failed - Cr in normal range and within 180 days    Creatinine, Ser  Date Value Ref Range Status  02/06/2021 2.40 (H) 0.76 - 1.27 mg/dL Final   Creatinine,U  Date Value Ref Range Status  04/18/2015 250.2 mg/dL Final   Creatinine, Urine  Date Value Ref Range Status  10/03/2015 35.87 mg/dL Final          Failed - eGFR in normal range and within 180 days    GFR calc Af Amer  Date Value Ref Range Status  01/04/2020 >60 >60 mL/min Final   GFR calc non Af Amer  Date Value Ref Range Status  01/04/2020 54 (L) >60 mL/min Final   GFR  Date Value Ref Range Status  06/02/2016 33.36 (L) >60.00 mL/min Final   eGFR  Date Value Ref Range  Status  02/06/2021 31 (L) >59 mL/min/1.73 Final          Passed - Total Cholesterol in normal range and within 360 days    Cholesterol, Total  Date Value Ref Range Status  07/06/2020 167 100 - 199 mg/dL Final          Passed - LDL in normal range and within 360 days    LDL Chol Calc (NIH)  Date Value Ref Range Status  07/06/2020 76 0 - 99 mg/dL Final   Direct LDL  Date Value Ref Range Status  06/02/2016 72.0 mg/dL Final    Comment:    Optimal:  <100 mg/dLNear or Above Optimal:  100-129 mg/dLBorderline High:  130-159 mg/dLHigh:  160-189 mg/dLVery High:  >190 mg/dL          Passed - ALT in normal range and within 180 days    ALT  Date Value Ref Range Status  02/06/2021 15 0 - 44 IU/L Final          Passed - AST in normal range and within 180 days    AST  Date Value Ref Range Status  02/06/2021 23 0 - 40 IU/L Final          Passed - Valid encounter within last 12 months    Recent Outpatient Visits           2 months ago Type 2 diabetes mellitus with stage 4 chronic kidney disease, with long-term current use of insulin ([place])    [place], [place], [place]   5 months ago Encounter for [employer] annual wellness exam   [place], [place], MD   9 months ago Hypertriglyceridemia   [place], [place], MD   11 months ago Type 2 diabetes mellitus with stage 3a chronic kidney disease, with long-term current use of insulin ([place])   [place], Merchant navy officer B, MD   1 year ago Hypertension in stage 3 chronic kidney disease due to type 2 diabetes mellitus ([place])   [place], Charlane Ferretti, MD       Future Appointments             In 4 weeks [name], MD [place]             varenicline (CHANTIX) 1 MG tablet 56 tablet 0    Sig: TAKE 1 TABLET BY MOUTH TWICE  DAILY     Psychiatry:  Drug Dependence Therapy Passed - 04/27/2021  9:27 PM      Passed - Valid encounter within last 12 months    Recent Outpatient Visits           2 months ago Type 2 diabetes mellitus with stage 4 chronic kidney disease, with long-term current use of insulin [facility])   [place], [place], [place]   5 months ago Encounter for [employer] annual wellness exam   [place], [place], MD   9 months ago Hypertriglyceridemia   [place], [place], MD   11 months ago Type 2 diabetes mellitus with stage 3a chronic kidney disease, with long-term current use of insulin ([place])   [place], Neoma Laming B, MD   1 year ago Hypertension in stage 3 chronic kidney disease due to  type 2 diabetes mellitus [facility])   [place], MD       Future Appointments             In 4 weeks [name], MD [place]

## 2021-04-28 NOTE — Telephone Encounter (Signed)
Requested Prescriptions  Pending Prescriptions Disp Refills   fenofibrate (TRICOR) 145 MG tablet [Pharmacy Med Name: Fenofibrate 145 MG Oral Tablet] 90 tablet 3    Sig: TAKE 1 TABLET BY MOUTH DAILY     Cardiovascular:  Antilipid - Fibric Acid Derivatives Failed - 04/27/2021  9:27 PM      Failed - HDL in normal range and within 360 days    HDL  Date Value Ref Range Status  07/06/2020 39 (L) >39 mg/dL Final         Failed - Triglycerides in normal range and within 360 days    Triglycerides  Date Value Ref Range Status  07/06/2020 323 (H) 0 - 149 mg/dL Final         Failed - Cr in normal range and within 180 days    Creatinine, Ser  Date Value Ref Range Status  02/06/2021 2.40 (H) 0.76 - 1.27 mg/dL Final   Creatinine,U  Date Value Ref Range Status  04/18/2015 250.2 mg/dL Final   Creatinine, Urine  Date Value Ref Range Status  10/03/2015 35.87 mg/dL Final         Failed - eGFR in normal range and within 180 days    GFR calc Af Amer  Date Value Ref Range Status  01/04/2020 >60 >60 mL/min Final   GFR calc non Af Amer  Date Value Ref Range Status  01/04/2020 54 (L) >60 mL/min Final   GFR  Date Value Ref Range Status  06/02/2016 33.36 (L) >60.00 mL/min Final   eGFR  Date Value Ref Range Status  02/06/2021 31 (L) >59 mL/min/1.73 Final         Passed - Total Cholesterol in normal range and within 360 days    Cholesterol, Total  Date Value Ref Range Status  07/06/2020 167 100 - 199 mg/dL Final         Passed - LDL in normal range and within 360 days    LDL Chol Calc (NIH)  Date Value Ref Range Status  07/06/2020 76 0 - 99 mg/dL Final   Direct LDL  Date Value Ref Range Status  06/02/2016 72.0 mg/dL Final    Comment:    Optimal:  <100 mg/dLNear or Above Optimal:  100-129 mg/dLBorderline High:  130-159 mg/dLHigh:  160-189 mg/dLVery High:  >190 mg/dL         Passed - ALT in normal range and within 180 days    ALT  Date Value Ref Range Status  02/06/2021 15 0 -  44 IU/L Final         Passed - AST in normal range and within 180 days    AST  Date Value Ref Range Status  02/06/2021 23 0 - 40 IU/L Final         Passed - Valid encounter within last 12 months    Recent Outpatient Visits          2 months ago Type 2 diabetes mellitus with stage 4 chronic kidney disease, with long-term current use of insulin [facility])   [place], [place], [place]   5 months ago Encounter for [employer] annual wellness exam   [place], [place], MD   9 months ago Hypertriglyceridemia   [place], [place], MD   11 months ago Type 2 diabetes mellitus with stage 3a chronic kidney disease, with long-term current use of insulin ([place])   [place]  [facility] [place], MD   1 year ago Hypertension in stage 3 chronic kidney disease due to type 2 diabetes mellitus ([place])   [place], [place], MD      Future Appointments            In 4 weeks [name], MD [place]            varenicline (CHANTIX) 1 MG tablet [Pharmacy Med Name: VARENICLINE  1MG  TAB] 56 tablet 0    Sig: TAKE 1 TABLET BY MOUTH TWICE  DAILY     Psychiatry:  Drug Dependence Therapy Passed - 04/27/2021  9:27 PM      Passed - Valid encounter within last 12 months    Recent Outpatient Visits          2 months ago Type 2 diabetes mellitus with stage 4 chronic kidney disease, with long-term current use of insulin ([place])   [place], [place], [place]   5 months ago Encounter for [employer] annual wellness exam   [place], [place], MD   9 months ago Hypertriglyceridemia   [place], [place], MD   11 months ago Type 2 diabetes mellitus with stage 3a chronic kidney disease, with  long-term current use of insulin ([place])   [place], Neoma Laming B, MD   1 year ago Hypertension in stage 3 chronic kidney disease due to type 2 diabetes mellitus ([place])   [place], Charlane Ferretti, MD      Future Appointments            In 4 weeks [name], MD [place]            traMADol (ULTRAM) 50 MG tablet [Pharmacy Med Name: traMADol HCl 50 MG Oral Tablet] 30 tablet     Sig: TAKE 1 TABLET BY MOUTH AT  BEDTIME AS NEEDED     Not Delegated - Analgesics:  Opioid Agonists Failed - 04/27/2021  9:27 PM      Failed - This refill cannot be delegated      Failed - Urine Drug Screen completed in last 360 days      Passed - Valid encounter within last 6 months    Recent Outpatient Visits          2 months ago Type 2 diabetes mellitus with stage 4 chronic kidney disease, with long-term current use of insulin [facility])   [place], [place], [place]   5 months ago Encounter for [employer] annual wellness exam   [place], [place], MD   9 months ago Hypertriglyceridemia   [place], [place], MD   11 months ago Type 2 diabetes mellitus with stage 3a chronic kidney disease, with long-term current use of insulin ([place])   [place], Neoma Laming B, MD   1 year ago Hypertension in stage 3 chronic kidney disease due to type 2 diabetes mellitus [facility])   [place], MD      Future Appointments            In 4 weeks [name], MD [place]

## 2021-04-29 ENCOUNTER — Ambulatory Visit: Payer: Medicare Other | Admitting: Podiatry

## 2021-04-29 DIAGNOSIS — M47816 Spondylosis without myelopathy or radiculopathy, lumbar region: Secondary | ICD-10-CM | POA: Diagnosis not present

## 2021-05-01 DIAGNOSIS — E1165 Type 2 diabetes mellitus with hyperglycemia: Secondary | ICD-10-CM | POA: Diagnosis not present

## 2021-05-06 ENCOUNTER — Other Ambulatory Visit: Payer: Self-pay | Admitting: Family Medicine

## 2021-05-06 DIAGNOSIS — M5136 Other intervertebral disc degeneration, lumbar region: Secondary | ICD-10-CM

## 2021-05-06 NOTE — Telephone Encounter (Signed)
Requested Prescriptions  Pending Prescriptions Disp Refills   gabapentin (NEURONTIN) 300 MG capsule [Pharmacy Med Name: Gabapentin 300 MG Oral Capsule] 180 capsule 2    Sig: TAKE 1 CAPSULE BY MOUTH TWICE  DAILY     Neurology: Anticonvulsants - gabapentin Passed - 05/06/2021  2:21 PM      Passed - Valid encounter within last 12 months    Recent Outpatient Visits          2 months ago Type 2 diabetes mellitus with stage 4 chronic kidney disease, with long-term current use of insulin [facility])   [place], [place], [place]   5 months ago Encounter for [employer] annual wellness exam   [place], [place], MD   10 months ago Hypertriglyceridemia   [place], [place], MD   12 months ago Type 2 diabetes mellitus with stage 3a chronic kidney disease, with long-term current use of insulin ([place])   [place], Neoma Laming B, MD   1 year ago Hypertension in stage 3 chronic kidney disease due to type 2 diabetes mellitus ([place])   [place], [place], MD      Future Appointments            In 3 weeks [name], MD [place]

## 2021-05-07 ENCOUNTER — Ambulatory Visit: Payer: Medicare Other | Admitting: Family Medicine

## 2021-05-08 ENCOUNTER — Ambulatory Visit (INDEPENDENT_AMBULATORY_CARE_PROVIDER_SITE_OTHER): Payer: Medicare Other | Admitting: Diagnostic Neuroimaging

## 2021-05-08 ENCOUNTER — Telehealth: Payer: Self-pay | Admitting: Diagnostic Neuroimaging

## 2021-05-08 ENCOUNTER — Other Ambulatory Visit: Payer: Self-pay

## 2021-05-08 ENCOUNTER — Encounter: Payer: Self-pay | Admitting: Diagnostic Neuroimaging

## 2021-05-08 VITALS — BP 121/68 | HR 96 | Ht 75.0 in | Wt 210.0 lb

## 2021-05-08 DIAGNOSIS — R269 Unspecified abnormalities of gait and mobility: Secondary | ICD-10-CM

## 2021-05-08 DIAGNOSIS — R413 Other amnesia: Secondary | ICD-10-CM

## 2021-05-08 DIAGNOSIS — R270 Ataxia, unspecified: Secondary | ICD-10-CM

## 2021-05-08 NOTE — Patient Instructions (Signed)
MILD COGNITIVE IMPAIRMENT (due to bipolar d/o, PTSD, h/o substance abuse) - check MRI brain  - supportive care - safety / supervision issues reviewed - no driving; caution with finances and medications  GAIT DIFFICULTY (due to lumbar spine dz + diabetic neuropathy + polypharmacy) - continue supportive care

## 2021-05-08 NOTE — Progress Notes (Signed)
[facility]  PATIENT: [name]. DOB: [birth year]  REFERRING CLINICIAN: Argentina Donovan, PA-C HISTORY FROM: patient  REASON FOR VISIT: new consult    HISTORICAL  CHIEF COMPLAINT:  Chief Complaint  Patient presents with   Memory Loss    Rm 6 Int Ref for falls, dizziness, memory , sisterMaudie Gray  MMSE 21    HISTORY OF PRESENT ILLNESS:   UPDATE (05/08/21, VRP): Since last visit, here for continued memory and gait difficulties. Symptoms are fluctuating day to day. Overall no change in the last 1-2 years. Also with gait and balance issues. Ongoing low back issues.  PRIOR HPI (09/14/19): 56 year old male with PTSD and bipolar disorder here for evaluation memory loss.  Patient previously had diagnosis of "early onset dementia" but in 2017 and 2018 this was not felt to be accurate.  Memory and confusion spells were thought to be related to psychiatry issues and metabolic derangements.  Patient is currently living alone.  He has support from his sister who is also his power of attorney.  He has had multiple emergency room visits and hospitalizations over the past few years.  These have been related to acute encephalopathy, hyperglycemia, falls, balance difficulties.   REVIEW OF SYSTEMS: Full 14 system review of systems performed and negative with exception of: As per HPI.  ALLERGIES: Allergies  Allergen Reactions   Vicodin [Hydrocodone-Acetaminophen] Itching    HOME MEDICATIONS: Outpatient Medications Prior to Visit  Medication Sig Dispense Refill   alfuzosin (UROXATRAL) 10 MG 24 hr tablet Take 10 mg by mouth at bedtime.      ARIPiprazole (ABILIFY) 5 MG tablet TAKE 1 TABLET BY MOUTH DAILY FOR DEPRESSION (Patient taking differently: Take 5 mg by mouth daily.) 30 tablet 0   atorvastatin (LIPITOR) 10 MG tablet TAKE 1 TABLET BY MOUTH AT  BEDTIME 90 tablet 3   buPROPion (WELLBUTRIN XL) 300 MG 24 hr tablet Take 1 tablet (300 mg total) by mouth daily. 30 tablet  0   busPIRone (BUSPAR) 10 MG tablet Take 10 mg by mouth 2 (two) times daily.     chlorhexidine (PERIDEX) 0.12 % solution SMARTSIG:By Mouth     ciclopirox (PENLAC) 8 % solution APPLY DAILY TO NAILS. ONCE  A WEEK FILE TOENAIL AND  CLEAN WITH ALCOHOL AND  REAPPLY 13.2 mL 0   clonazePAM (KLONOPIN) 0.5 MG tablet Take 0.5 mg by mouth 2 (two) times daily as needed.     Continuous Blood Gluc Receiver (FREESTYLE LIBRE READER) DEVI Use as directed 1 each each   Continuous Blood Gluc Sensor (FREESTYLE LIBRE 14 DAY SENSOR) MISC Use as directed 2 each 4   divalproex (DEPAKOTE ER) 500 MG 24 hr tablet Take 500 mg by mouth daily.     fenofibrate (TRICOR) 145 MG tablet TAKE 1 TABLET BY MOUTH DAILY 90 tablet 3   FLUARIX QUADRIVALENT 0.5 ML injection      fluticasone (FLONASE) 50 MCG/ACT nasal spray Place 1 spray into both nostrils daily. 48 g 3   gabapentin (NEURONTIN) 300 MG capsule TAKE 1 CAPSULE BY MOUTH TWICE  DAILY 180 capsule 2   glucose blood (ACCU-CHEK GUIDE) test strip CHECK SUGAR FOUR TIMES DAILY 100 each 12   insulin glargine (LANTUS SOLOSTAR) 100 UNIT/ML Solostar Pen Inject 40 Units into the skin every morning. And pen needles 1/day 45 mL PRN   insulin lispro (HUMALOG) 100 UNIT/ML KwikPen Inject 5 Units into the skin daily with breakfast. And pen needles 2/day 15 mL 11  Insulin Pen Needle (B-D UF III MINI PEN NEEDLES) 31G X 5 MM MISC USE FOUR TIMES DAILY 120 each 12   lisinopril (ZESTRIL) 5 MG tablet TAKE 1 TABLET BY MOUTH  DAILY 90 tablet 3   mupirocin ointment (BACTROBAN) 2 % Apply 1 application topically 2 (two) times daily. 30 g 2   oxyCODONE-acetaminophen (PERCOCET/ROXICET) 5-325 MG tablet Take by mouth.     pantoprazole (PROTONIX) 40 MG tablet TAKE 1 TABLET BY MOUTH  DAILY 90 tablet 3   QUEtiapine (SEROQUEL) 400 MG tablet Take 400 mg by mouth as needed.     tadalafil (CIALIS) 5 MG tablet TAKE ONE TABLET BY MOUTH EVERY DAY FOR BPH 90 tablet 1   Testosterone 20.25 MG/ACT (1.62%) GEL APPLY  TOPICALLY 1 PUMP IN  THE MORNING UNDER ARM PIT  ALTERNATING WITH EACH  APPLICATION 75 g 2   tiZANidine (ZANAFLEX) 4 MG tablet TAKE 1 TABLET BY MOUTH  EVERY 8 HOURS AS NEEDED FOR MUSCLE SPASM(S) 90 tablet 2   traMADol (ULTRAM) 50 MG tablet TAKE 1 TABLET BY MOUTH AT  BEDTIME AS NEEDED 30 tablet 1   traZODone (DESYREL) 150 MG tablet Take 1 tablet (150 mg total) by mouth at bedtime. 90 tablet 1   varenicline (CHANTIX) 1 MG tablet TAKE 1 TABLET BY MOUTH TWICE  DAILY 56 tablet 0   Testosterone 1.62 % GEL      busPIRone (BUSPAR) 15 MG tablet Take 15 mg by mouth 2 (two) times daily.  (Patient not taking: Reported on 05/08/2021)     nicotine (NICODERM CQ) 14 mg/24hr patch Place 1 patch (14 mg total) onto the skin daily. Then 7 mg/24 hr patch for 1 month (Patient not taking: Reported on 05/08/2021) 28 patch 1   traMADol (ULTRAM) 50 MG tablet Take 1 tablet (50 mg total) by mouth every 8 (eight) hours as needed for up to 15 doses for severe pain. 15 tablet 0   No facility-administered medications prior to visit.    PAST MEDICAL HISTORY: Past Medical History:  Diagnosis Date   ADD (attention deficit disorder)    Anxiety    Arthritis    right hip   Bipolar 1 disorder (HCC)    Blood in urine    CKD (chronic kidney disease), stage III (HCC)    Creatinine elevation    Dementia ([place])    "early onset" (08/04/2017)   Depression    bipolar [facility]   Diabetes mellitus without complication ([place])    Family history of anesthesia complication    pt is unsure , but pt father may have been difficult to arouse    HCAP (healthcare-associated pneumonia) 10/31/2012   History of kidney stones    Hypertension    Hypogonadism male    Liver fatty degeneration    Microscopic hematuria    hereditary s/p Urology eval   Neuromuscular disorder ([place])    feet neuropathy    Osteoarthritis of right hip 11/28/2011   2012 2015 s/p THR Severe  Dr Novella Olive     Pleural effusion 11/02/2012   Pneumonia 10-2012   Pneumonia,  organism unspecified([place]) 11/02/2012   Polysubstance dependence, non-opioid, in remission ([place])    remote   Primary osteoarthritis of left hip 05/22/2015   PTSD (post-traumatic stress disorder)    SOCIAL ANXIETY DISORDER    Schizoaffective disorder ([place])    Substance abuse ([place])    Suicide attempt by multiple drug overdose ([place]) 15-Jan-2016   Grieving his cat's death 07/25/2015    PAST  SURGICAL HISTORY: Past Surgical History:  Procedure Laterality Date   BACK SURGERY     CLOSED REDUCTION METACARPAL WITH PERCUTANEOUS PINNING Right    LUMBAR [place] SURGERY     TONSILLECTOMY     TOTAL HIP ARTHROPLASTY Right 08/16/2013   Procedure: TOTAL HIP ARTHROPLASTY ANTERIOR APPROACH;  Surgeon: Hessie Dibble, MD;  Location: [facility];  Service: Orthopedics;  Laterality: Right;   TOTAL HIP ARTHROPLASTY Left 05/22/2015   Procedure: TOTAL HIP ARTHROPLASTY ANTERIOR APPROACH;  Surgeon: Melrose Nakayama, MD;  Location: [facility];  Service: Orthopedics;  Laterality: Left;    FAMILY HISTORY: Family History  Problem Relation Age of Onset   Diabetes Father    Cancer Mother        died of melanoma with mets   Cervical cancer Sister    Diabetes Sister    Other Neg Hx        hypogonadism   Colon cancer Neg Hx    Colon polyps Neg Hx    Esophageal cancer Neg Hx    Rectal cancer Neg Hx    Stomach cancer Neg Hx     SOCIAL HISTORY: Social History   Socioeconomic History   Marital status: Single    Spouse name: Not on file   Number of children: 0   Years of education: Not on file   Highest education level: Some college, no degree  Occupational History   Occupation: disability  Tobacco Use   Smoking status: Former    Years: 0.25    Types: Cigarettes    Quit date: 03/22/2019    Years since quitting: 2.1   Smokeless tobacco: Never  Vaping Use   Vaping Use: Never used  Substance and Sexual Activity   Alcohol use: Not Currently   Drug use: No    Comment: hx of marijuana/cocaine/crack use but sober since 20's    Sexual activity: Not Currently  Other Topics Concern   Not on file  Social History Narrative   05/08/2021 lives alone, sister [name] helps with meds, he has some in home care, lived with sister until Nov 2020   Caffeine- sodas, amount  varies   regular exercise-no   Social Determinants of Health   Financial Resource Strain: Not on file  Food Insecurity: Not on file  Transportation Needs: Not on file  Physical Activity: Not on file  Stress: Not on file  Social Connections: Not on file  Intimate Partner Violence: Not on file     PHYSICAL EXAM  GENERAL EXAM/CONSTITUTIONAL: Vitals:  Vitals:   05/08/21 1435  BP: 121/68  Pulse: 96  Weight: 210 lb (95.3 kg)  Height: 6\' 3"  (1.905 m)    Body mass index is 26.25 kg/m. Wt Readings from Last 3 Encounters:  05/08/21 210 lb (95.3 kg)  02/26/21 210 lb 1.6 oz (95.3 kg)  02/06/21 210 lb 3.2 oz (95.3 kg)   Patient is in no distress; well developed, nourished and groomed; neck is supple  CARDIOVASCULAR: Examination of carotid arteries is normal; no carotid bruits Regular rate and rhythm, no murmurs Examination of peripheral vascular system by observation and palpation is normal  EYES: Ophthalmoscopic exam of optic discs and posterior segments is normal; no papilledema or hemorrhages No results found.  MUSCULOSKELETAL: Gait, strength, tone, movements noted in Neurologic exam below  NEUROLOGIC: MENTAL STATUS:  MMSE - Mini Mental State Exam 05/08/2021 09/14/2019  Orientation to time 3 5  Orientation to Place 4 4  Registration 3 3  Attention/ Calculation 0 0  Recall 2 2  Language- name 2 objects 2 2  Language- repeat 1 1  Language- follow 3 step command 3 3  Language- read & follow direction 1 1  Write a sentence 1 1  Copy design 1 0  Total score 21 22   awake, alert, oriented to person, place and time recent and remote memory intact normal attention and concentration language fluent, comprehension intact, naming  intact fund of knowledge appropriate  CRANIAL NERVE:  2nd - no papilledema on fundoscopic exam 2nd, 3rd, 4th, 6th - pupils equal and reactive to light, visual fields full to confrontation, extraocular muscles intact, no nystagmus 5th - facial sensation symmetric 7th - facial strength symmetric 8th - hearing intact 9th - palate elevates symmetrically, uvula midline 11th - shoulder shrug symmetric 12th - tongue protrusion midline  MOTOR:  normal bulk and tone, full strength in the BUE, BLE; LIMITED IN BLE DUE TO PAIN  SENSORY:  normal and symmetric to light touch, temperature, vibration; DECR IN FEET  COORDINATION:  finger-nose-finger, fine finger movements normal  REFLEXES:  deep tendon reflexes TRACE and symmetric  GAIT/STATION:  UNSTEADY GAIT; USING WALKER     DIAGNOSTIC DATA (LABS, IMAGING, TESTING) - I reviewed patient records, labs, notes, testing and imaging myself where available.  Lab Results  Component Value Date   WBC 5.8 02/06/2021   HGB 14.7 02/06/2021   HCT 42.8 02/06/2021   MCV 89 02/06/2021   PLT 151 02/06/2021      Component Value Date/Time   NA 140 02/06/2021 1542   K 4.7 02/06/2021 1542   CL 102 02/06/2021 1542   CO2 25 02/06/2021 1542   GLUCOSE 85 02/06/2021 1542   GLUCOSE 210 (H) 01/04/2020 0844   BUN 25 (H) 02/06/2021 1542   CREATININE 2.40 (H) 02/06/2021 1542   CALCIUM 10.1 02/06/2021 1542   PROT 8.0 02/06/2021 1542   ALBUMIN 5.2 (H) 02/06/2021 1542   AST 23 02/06/2021 1542   ALT 15 02/06/2021 1542   ALKPHOS 52 02/06/2021 1542   BILITOT 0.5 02/06/2021 1542   GFRNONAA 54 (L) 01/04/2020 0844   GFRAA >60 01/04/2020 0844   Lab Results  Component Value Date   CHOL 167 07/06/2020   HDL 39 (L) 07/06/2020   LDLCALC 76 07/06/2020   LDLDIRECT 72.0 06/02/2016   TRIG 323 (H) 07/06/2020   CHOLHDL 4.3 07/06/2020   Lab Results  Component Value Date   HGBA1C 8.1 (A) 11/14/2020   Lab Results  Component Value Date   [id number] 341  08/04/2017   Lab Results  Component Value Date   TSH 1.83 08/30/2020    09/10/16  MRI HEAD IMPRESSION: 1. No acute intracranial process identified. 2. Moderately advanced cerebral atrophy for patient age. Otherwise unremarkable brain MRI.   09/10/16  MRI CERVICAL SPINE IMPRESSION: 1. Large right paracentral disc protrusion at C5-6 with resultant moderate canal stenosis. Flattening of the right hemi cord without cord signal changes. 2. Broad posterior disc protrusion at C6-7 with mild to moderate canal stenosis. 3. Multifactorial degenerative changes with moderate to advanced bilateral C5 and C6 foraminal stenosis.   09/10/16  MRI THORACIC SPINE IMPRESSION: 1. Normal MRI appearance of the thoracic spinal cord. No significant canal stenosis identified. 2. Mild degenerative disc bulging at T4-5, T5-6, and T8-9 without stenosis. 3. Left-sided posterior element hypertrophy at T10-11 with resultant mild to moderate left foraminal stenosis.   05/14/19 CT head [I reviewed images myself and agree with interpretation. -VRP]  1. No acute intracranial abnormality.  No intracranial mass, hemorrhage or edema. No skull fracture. 2. No fracture or acute subluxation within the cervical spine. 3. Degenerative change within the cervical spine, most prominent at C5-6 where there is a prominent disc-osteophytic bulge and superimposed ossification of the posterior longitudinal ligament, causing moderate central canal stenosis with possible mass effect on the anterior portion of the cervical cord.  01/01/20 CT head  1. Generalized cerebral atrophy. 2. No acute intracranial abnormality    ASSESSMENT AND PLAN  56 y.o. year old male here with:  Dx:  1. Memory loss   2. Gait abnormality   3. Ataxia       PLAN:  MILD COGNITIVE IMPAIRMENT (due to bipolar d/o, PTSD, h/o substance abuse) - check MRI brain  - supportive care - safety / supervision issues reviewed - no driving; caution  with finances and medications  GAIT DIFFICULTY (due to lumbar spine dz + diabetic neuropathy + polypharmacy) - continue supportive care  Orders Placed This Encounter  Procedures   MR BRAIN W WO CONTRAST   No follow-ups on file.    Penni Bombard, MD 7/59/1638, 4:66 PM Certified in Neurology, Neurophysiology and Neuroimaging  [facility] [address] [place], [place] [address] [phone]

## 2021-05-08 NOTE — Telephone Encounter (Signed)
[employer] medicare/medicaid order sent to [facility], [facility] they will reach out to the patient to schedule.

## 2021-05-17 ENCOUNTER — Encounter: Payer: Self-pay | Admitting: Family Medicine

## 2021-05-17 ENCOUNTER — Other Ambulatory Visit: Payer: Self-pay | Admitting: Family Medicine

## 2021-05-17 DIAGNOSIS — M5136 Other intervertebral disc degeneration, lumbar region: Secondary | ICD-10-CM

## 2021-05-17 NOTE — Telephone Encounter (Signed)
Requested medications are due for refill today.  unsure  Requested medications are on the active medications list.  Yes botha re  Last refill. Zanaflex 02/22/2021 #90 2 refills, Chantix 04/28/2021 #56   Future visit scheduled.   yes  Notes to clinic.  Zanaflex is not delegated. Chantix was just refilled 04/28/2021. Previously Chantix is refilled in 1 month supplies. Please advise.    Requested Prescriptions  Pending Prescriptions Disp Refills   tiZANidine (ZANAFLEX) 4 MG tablet [Pharmacy Med Name: tiZANidine HCl 4 MG Oral Tablet] 90 tablet 2    Sig: TAKE 1 TABLET BY MOUTH EVERY 8  HOURS AS NEEDED FOR MUSCLE  SPASM(S)     Not Delegated - Cardiovascular:  Alpha-2 Agonists - tizanidine Failed - 05/17/2021  6:22 PM      Failed - This refill cannot be delegated      Passed - Valid encounter within last 6 months    Recent Outpatient Visits           3 months ago Type 2 diabetes mellitus with stage 4 chronic kidney disease, with long-term current use of insulin ([place])   [place], [place], [place]   5 months ago Encounter for [employer] annual wellness exam   [place], [place], MD   10 months ago Hypertriglyceridemia   [place], [place], MD   1 year ago Type 2 diabetes mellitus with stage 3a chronic kidney disease, with long-term current use of insulin ([place])   [place], Neoma Laming B, MD   1 year ago Hypertension in stage 3 chronic kidney disease due to type 2 diabetes mellitus ([place])   [place], Charlane Ferretti, MD       Future Appointments             In 1 week [name], MD [place]             varenicline (CHANTIX) 1 MG tablet [Pharmacy Med Name: VARENICLINE  1MG   TAB] 56 tablet 0    Sig: TAKE 1 TABLET BY MOUTH TWICE  DAILY     Psychiatry:  Drug Dependence  Therapy Passed - 05/17/2021  6:22 PM      Passed - Valid encounter within last 12 months    Recent Outpatient Visits           3 months ago Type 2 diabetes mellitus with stage 4 chronic kidney disease, with long-term current use of insulin [facility])   [place], [place], [place]   5 months ago Encounter for [employer] annual wellness exam   [place], [place], MD   10 months ago Hypertriglyceridemia   [place], [place], MD   1 year ago Type 2 diabetes mellitus with stage 3a chronic kidney disease, with long-term current use of insulin ([place])   [place], Neoma Laming B, MD   1 year ago Hypertension in stage 3 chronic kidney disease due to type 2 diabetes mellitus [facility])   [place], MD       Future Appointments             In 1 week [name], MD [place]

## 2021-05-20 ENCOUNTER — Other Ambulatory Visit: Payer: Self-pay | Admitting: Family Medicine

## 2021-05-20 DIAGNOSIS — E349 Endocrine disorder, unspecified: Secondary | ICD-10-CM

## 2021-05-20 MED ORDER — TESTOSTERONE 20.25 MG/ACT (1.62%) TD GEL: TRANSDERMAL | 5 refills | Status: DC

## 2021-05-22 ENCOUNTER — Other Ambulatory Visit: Payer: Self-pay

## 2021-05-22 ENCOUNTER — Ambulatory Visit
Admission: RE | Admit: 2021-05-22 | Discharge: 2021-05-22 | Disposition: A | Discharge status: Home / Self Care | Payer: Medicare Other | Source: Ambulatory Visit | Attending: Diagnostic Neuroimaging | Admitting: Diagnostic Neuroimaging

## 2021-05-22 DIAGNOSIS — R270 Ataxia, unspecified: Secondary | ICD-10-CM | POA: Diagnosis not present

## 2021-05-22 DIAGNOSIS — R413 Other amnesia: Secondary | ICD-10-CM | POA: Diagnosis not present

## 2021-05-22 DIAGNOSIS — R269 Unspecified abnormalities of gait and mobility: Secondary | ICD-10-CM

## 2021-05-22 MED ORDER — GADOBENATE DIMEGLUMINE 529 MG/ML IV SOLN
20.0000 mL | Freq: Once | INTRAVENOUS | Status: AC | PRN
  Administered 2021-05-22: 20 mL via INTRAVENOUS

## 2021-05-24 ENCOUNTER — Other Ambulatory Visit: Payer: Self-pay | Admitting: Family Medicine

## 2021-05-24 DIAGNOSIS — E349 Endocrine disorder, unspecified: Secondary | ICD-10-CM

## 2021-05-24 MED ORDER — TESTOSTERONE 20.25 MG/ACT (1.62%) TD GEL: TRANSDERMAL | 5 refills | Status: DC

## 2021-05-27 ENCOUNTER — Ambulatory Visit: Payer: Medicare Other | Attending: Family Medicine | Admitting: Family Medicine

## 2021-05-27 ENCOUNTER — Encounter: Payer: Self-pay | Admitting: Family Medicine

## 2021-05-27 VITALS — BP 132/79 | HR 95 | Ht 75.0 in | Wt 214.1 lb

## 2021-05-27 DIAGNOSIS — R251 Tremor, unspecified: Secondary | ICD-10-CM | POA: Diagnosis not present

## 2021-05-27 DIAGNOSIS — Z23 Encounter for immunization: Secondary | ICD-10-CM | POA: Diagnosis not present

## 2021-05-27 DIAGNOSIS — F250 Schizoaffective disorder, bipolar type: Secondary | ICD-10-CM

## 2021-05-27 DIAGNOSIS — Z79899 Other long term (current) drug therapy: Secondary | ICD-10-CM | POA: Diagnosis not present

## 2021-05-27 DIAGNOSIS — E349 Endocrine disorder, unspecified: Secondary | ICD-10-CM | POA: Diagnosis not present

## 2021-05-27 DIAGNOSIS — Z1159 Encounter for screening for other viral diseases: Secondary | ICD-10-CM

## 2021-05-27 DIAGNOSIS — E1159 Type 2 diabetes mellitus with other circulatory complications: Secondary | ICD-10-CM

## 2021-05-27 DIAGNOSIS — Z794 Long term (current) use of insulin: Secondary | ICD-10-CM

## 2021-05-27 DIAGNOSIS — R296 Repeated falls: Secondary | ICD-10-CM | POA: Diagnosis not present

## 2021-05-27 DIAGNOSIS — E1122 Type 2 diabetes mellitus with diabetic chronic kidney disease: Secondary | ICD-10-CM | POA: Diagnosis not present

## 2021-05-27 DIAGNOSIS — I152 Hypertension secondary to endocrine disorders: Secondary | ICD-10-CM

## 2021-05-27 DIAGNOSIS — M5136 Other intervertebral disc degeneration, lumbar region: Secondary | ICD-10-CM | POA: Diagnosis not present

## 2021-05-27 DIAGNOSIS — N184 Chronic kidney disease, stage 4 (severe): Secondary | ICD-10-CM

## 2021-05-27 MED ORDER — TRAMADOL HCL 50 MG PO TABS: 50.0000 mg | ORAL_TABLET | Freq: Two times a day (BID) | ORAL | 1 refills | Status: DC | PRN

## 2021-05-27 NOTE — Progress Notes (Signed)
Subjective:  Patient ID: Jonathan Meckel., male    DOB: 28-Jul-1965  Age: 56 y.o. MRN: [id number]  CC: Hypertension   HPI Jonathan Goar. is a 56 y.o. 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 Followed by Spine specialist and is being worked up for  Nerve block or epidural Also sees Urology for BPH.  [name] continues to experience memory loss, ataxia, tremors and has seen neurology and been worked up in the past.  His most recent appointment with neurology was 2 weeks ago and notes reviewed indicate ataxia likely related to lumbar spine disease coupled with neuropathy and polypharmacy.  Mild cognitive impairment secondary to bipolar disorder, PTSD and previous history of substance abuse. The patient is concerned because of the 'substance abuse' documented in his chart as [name] states [name] last used recreational drugs in his 16s.  I have explained to him that his chart reveals a history of not ongoing use of substances. MRI ordered by neurology revealed: IMPRESSION:    MRI brain (with and without) demonstrating: -Moderate generalized atrophy and mild ventriculomegaly on ex vacuo basis, progressed since Jun 05, 2015. -No acute findings.    Complains of severe back pain. His Epidural injections do not work Last dose of Tramadol was last night and [name] takes it nightly.  [name] is wondering why [name] was cut back from twice daily to nightly.  Of note [name] also receives gabapentin and tizanidine for his pain.  [name] ambulates with a walker.  His schizoaffective disorder is managed by psych and [name] endorses compliance with his medications. [name] would like to have an HIV test. Past Medical History:  Diagnosis Date   ADD (attention deficit  disorder)    Anxiety    Arthritis    right hip   Bipolar 1 disorder ([place])    Blood in urine    CKD (chronic kidney disease), stage III ([place])    Creatinine elevation    Dementia ([place])    "early onset" (08/04/2017)   Depression    bipolar [facility]   Diabetes mellitus without complication ([place])    Family history of anesthesia complication    pt is unsure , but pt father may have been difficult to arouse    HCAP (healthcare-associated pneumonia) 10/31/2012   History of kidney stones    Hypertension    Hypogonadism male    Liver fatty degeneration    Microscopic hematuria    hereditary s/p Urology eval   Neuromuscular disorder ([place])    feet neuropathy    Osteoarthritis of right hip 11/28/2011   2012 2015 s/p THR Severe  Dr Novella Olive     Pleural effusion 11/02/2012   Pneumonia 10-2012   Pneumonia, organism unspecified(486) 11/02/2012   Polysubstance dependence, non-opioid, in remission ([place])    remote   Primary osteoarthritis of left hip 05/22/2015   PTSD (post-traumatic stress disorder)    SOCIAL ANXIETY DISORDER    Schizoaffective disorder ([place])    Substance abuse ([place])    Suicide attempt by multiple drug overdose ([place]) 28-Dec-2015   Grieving his cat's death June 05, 2015    Past Surgical History:  Procedure Laterality Date   BACK SURGERY     CLOSED REDUCTION METACARPAL WITH PERCUTANEOUS PINNING Right  LUMBAR DISC SURGERY     TONSILLECTOMY     TOTAL HIP ARTHROPLASTY Right 08/16/2013   Procedure: TOTAL HIP ARTHROPLASTY ANTERIOR APPROACH;  Surgeon: Hessie Dibble, MD;  Location: [place];  Service: Orthopedics;  Laterality: Right;   TOTAL HIP ARTHROPLASTY Left 05/22/2015   Procedure: TOTAL HIP ARTHROPLASTY ANTERIOR APPROACH;  Surgeon: Melrose Nakayama, MD;  Location: [place];  Service: Orthopedics;  Laterality: Left;    Family History  Problem Relation Age of Onset   Diabetes Father    Cancer Mother        died of melanoma with mets   Cervical cancer Sister    Diabetes Sister     Other Neg Hx        hypogonadism   Colon cancer Neg Hx    Colon polyps Neg Hx    Esophageal cancer Neg Hx    Rectal cancer Neg Hx    Stomach cancer Neg Hx     Allergies  Allergen Reactions   Vicodin [Hydrocodone-Acetaminophen] Itching    Outpatient Medications Prior to Visit  Medication Sig Dispense Refill   alfuzosin (UROXATRAL) 10 MG 24 hr tablet Take 10 mg by mouth at bedtime.      ARIPiprazole (ABILIFY) 5 MG tablet TAKE 1 TABLET BY MOUTH DAILY FOR DEPRESSION (Patient taking differently: Take 5 mg by mouth daily.) 30 tablet 0   atorvastatin (LIPITOR) 10 MG tablet TAKE 1 TABLET BY MOUTH AT  BEDTIME 90 tablet 3   buPROPion (WELLBUTRIN XL) 300 MG 24 hr tablet Take 1 tablet (300 mg total) by mouth daily. 30 tablet 0   busPIRone (BUSPAR) 10 MG tablet Take 10 mg by mouth 2 (two) times daily.     busPIRone (BUSPAR) 15 MG tablet Take 15 mg by mouth 2 (two) times daily.     chlorhexidine (PERIDEX) 0.12 % solution SMARTSIG:By Mouth     ciclopirox (PENLAC) 8 % solution APPLY DAILY TO NAILS. ONCE  A WEEK FILE TOENAIL AND  CLEAN WITH ALCOHOL AND  REAPPLY 13.2 mL 0   clonazePAM (KLONOPIN) 0.5 MG tablet Take 0.5 mg by mouth 2 (two) times daily as needed.     Continuous Blood Gluc Receiver (FREESTYLE LIBRE READER) DEVI Use as directed 1 each each   Continuous Blood Gluc Sensor (FREESTYLE LIBRE 14 DAY SENSOR) MISC Use as directed 2 each 4   divalproex (DEPAKOTE ER) 500 MG 24 hr tablet Take 500 mg by mouth daily.     fenofibrate (TRICOR) 145 MG tablet TAKE 1 TABLET BY MOUTH DAILY 90 tablet 3   FLUARIX QUADRIVALENT 0.5 ML injection      fluticasone (FLONASE) 50 MCG/ACT nasal spray Place 1 spray into both nostrils daily. 48 g 3   gabapentin (NEURONTIN) 300 MG capsule TAKE 1 CAPSULE BY MOUTH TWICE  DAILY 180 capsule 2   glucose blood (ACCU-CHEK GUIDE) test strip CHECK SUGAR FOUR TIMES DAILY 100 each 12   insulin glargine (LANTUS SOLOSTAR) 100 UNIT/ML Solostar Pen Inject 40 Units into the skin every  morning. And pen needles 1/day 45 mL PRN   insulin lispro (HUMALOG) 100 UNIT/ML KwikPen Inject 5 Units into the skin daily with breakfast. And pen needles 2/day 15 mL 11   Insulin Pen Needle (B-D UF III MINI PEN NEEDLES) 31G X 5 MM MISC USE FOUR TIMES DAILY 120 each 12   lisinopril (ZESTRIL) 5 MG tablet TAKE 1 TABLET BY MOUTH  DAILY 90 tablet 3   mupirocin ointment (BACTROBAN) 2 % Apply 1 application topically  2 (two) times daily. 30 g 2   nicotine (NICODERM CQ) 14 mg/24hr patch Place 1 patch (14 mg total) onto the skin daily. Then 7 mg/24 hr patch for 1 month 28 patch 1   oxyCODONE-acetaminophen (PERCOCET/ROXICET) 5-325 MG tablet Take by mouth.     pantoprazole (PROTONIX) 40 MG tablet TAKE 1 TABLET BY MOUTH  DAILY 90 tablet 3   QUEtiapine (SEROQUEL) 400 MG tablet Take 400 mg by mouth as needed.     tadalafil (CIALIS) 5 MG tablet TAKE ONE TABLET BY MOUTH EVERY DAY FOR BPH 90 tablet 1   Testosterone 20.25 MG/ACT (1.62%) GEL Apply topically once daily to the upper arms 75 g 5   tiZANidine (ZANAFLEX) 4 MG tablet TAKE 1 TABLET BY MOUTH EVERY 8  HOURS AS NEEDED FOR MUSCLE  SPASM(S) 90 tablet 0   traMADol (ULTRAM) 50 MG tablet TAKE 1 TABLET BY MOUTH AT  BEDTIME AS NEEDED 30 tablet 1   traZODone (DESYREL) 150 MG tablet Take 1 tablet (150 mg total) by mouth at bedtime. 90 tablet 1   varenicline (CHANTIX) 1 MG tablet TAKE 1 TABLET BY MOUTH TWICE  DAILY 56 tablet 0   No facility-administered medications prior to visit.     ROS Review of Systems  Constitutional:  Negative for activity change and appetite change.  HENT:  Negative for sinus pressure and sore throat.   Eyes:  Negative for visual disturbance.  Respiratory:  Negative for cough, chest tightness and shortness of breath.   Cardiovascular:  Negative for chest pain and leg swelling.  Gastrointestinal:  Negative for abdominal distention, abdominal pain, constipation and diarrhea.  Endocrine: Negative.   Genitourinary:  Negative for dysuria.   Musculoskeletal:  Positive for back pain and gait problem. Negative for joint swelling and myalgias.  Skin:  Negative for rash.  Allergic/Immunologic: Negative.   Neurological:  Positive for tremors and weakness. Negative for light-headedness and numbness.  Psychiatric/Behavioral:  Negative for dysphoric mood and suicidal ideas.    Objective:  BP 132/79    Pulse 95    Ht _0  (1.905 m)    Wt 214 lb 1.3 oz (97.1 kg)    SpO2 97%    BMI 26.76 kg/m   BP/Weight 05/27/2021 05/08/2021 49/07/4965  Systolic BP 591 638 466  Diastolic BP 79 68 86  Wt. (Lbs) 214.08 210 -  BMI 26.76 26.25 -      Physical Exam Constitutional:      Appearance: [name] is well-developed.  Cardiovascular:     Rate and Rhythm: Normal rate.     Heart sounds: Normal heart sounds. No murmur heard. Pulmonary:     Effort: Pulmonary effort is normal.     Breath sounds: Normal breath sounds. No wheezing or rales.  Chest:     Chest wall: No tenderness.  Abdominal:     General: Bowel sounds are normal. There is no distension.     Palpations: Abdomen is soft. There is no mass.     Tenderness: There is no abdominal tenderness.  Musculoskeletal:     Right lower leg: No edema.     Left lower leg: No edema.     Comments: Tenderness on palpation of lumbar spine Unable to perform straight leg raise bilaterally  Neurological:     Mental Status: [name] is alert and oriented to person, place, and time.     Motor: Weakness present.     Gait: Gait abnormal.     Comments: Positive for tremor  Psychiatric:  Mood and Affect: Mood normal.    CMP Latest Ref Rng & Units 02/06/2021 07/06/2020 01/04/2020  Glucose 70 - 99 mg/dL 85 229(H) 210(H)  BUN 6 - 24 mg/dL 25(H) 26(H) 27(H)  Creatinine 0.76 - 1.27 mg/dL 2.40(H) 1.97(H) 1.48(H)  Sodium 134 - 144 mmol/L 140 136 134(L)  Potassium 3.5 - 5.2 mmol/L 4.7 4.9 3.9  Chloride 96 - 106 mmol/L 102 94(L) 100  CO2 20 - 29 mmol/L _0 Calcium 8.7 - 10.2 mg/dL 10.1 10.3(H) 9.1  Total  Protein 6.0 - 8.5 g/dL 8.0 7.5 -  Total Bilirubin 0.0 - 1.2 mg/dL 0.5 0.4 -  Alkaline Phos 44 - 121 IU/L 52 85 -  AST 0 - 40 IU/L 23 23 -  ALT 0 - 44 IU/L 15 16 -    Lipid Panel     Component Value Date/Time   CHOL 167 07/06/2020 1116   TRIG 323 (H) 07/06/2020 1116   HDL 39 (L) 07/06/2020 1116   CHOLHDL 4.3 07/06/2020 1116   CHOLHDL 7 06/02/2016 0844   VLDL 41 (H) 10/03/2015 0434   LDLCALC 76 07/06/2020 1116   LDLDIRECT 72.0 06/02/2016 0844    CBC    Component Value Date/Time   WBC 5.8 02/06/2021 1542   WBC 8.0 01/01/2020 1520   RBC 4.83 02/06/2021 1542   RBC 4.73 01/01/2020 1520   HGB 14.7 02/06/2021 1542   HCT 42.8 02/06/2021 1542   PLT 151 02/06/2021 1542   MCV 89 02/06/2021 1542   MCH 30.4 02/06/2021 1542   MCH 29.4 01/01/2020 1520   MCHC 34.3 02/06/2021 1542   MCHC 33.7 01/01/2020 1520   RDW 13.0 02/06/2021 1542   LYMPHSABS 2.3 02/06/2021 1542   MONOABS 0.4 05/17/2019 0532   EOSABS 0.1 02/06/2021 1542   BASOSABS 0.1 02/06/2021 1542    Lab Results  Component Value Date   HGBA1C 8.1 (A) 11/14/2020    Lab Results  Component Value Date   TSH 1.83 08/30/2020     Assessment & Plan:  1. Degenerative disc disease, lumbar Uncontrolled PDMP reviewed and his overdose risk score is moderate I have explained this to him I have increased his tramadol to twice daily dosing Referred to pain management - Ambulatory referral to Pain Clinic - traMADol (ULTRAM) 50 MG tablet; Take 1 tablet (50 mg total) by mouth every 12 (twelve) hours as needed.  Dispense: 60 tablet; Refill: 1  2. Type 2 diabetes mellitus with stage 4 chronic kidney disease, with long-term current use of insulin (HCC) Last A1c was 8.1 We will send off A1c Continue with diabetic regimen Counseled on Diabetic diet, my plate method, 633 minutes of moderate intensity exercise/week Blood sugar logs with fasting goals of 80-120 mg/dl, random of less than 180 and in the event of sugars less than 60 mg/dl  or greater than 400 mg/dl encouraged to notify the clinic. Advised on the need for annual eye exams, annual foot exams, Pneumonia vaccine. - CMP14+EGFR - Hemoglobin A1c  3. Hypertension associated with diabetes ([place]) Controlled Continue lisinopril Counseled on blood pressure goal of less than 130/80, low-sodium, DASH diet, medication compliance, 150 minutes of moderate intensity exercise per week. Discussed medication compliance, adverse effects.  4. Recurrent falls Secondary to ataxia, underlying lumbar spine degenerative disease We will refer for home PT Fall precautions  5. Schizoaffective disorder, bipolar type ([place]) [name] is currently on Abilify, Depakote, Klonopin, BuSpar, Seroquel Advised to discuss with his psychiatrist as this multiple psychotropic medications could be  contributing to his falls  6. Tremor Thyroid panel was previously normal Seen by neurology with no neurologic cause identified but suspicious for polypharmacy as etiology  7. Polypharmacy [name] is wondering if gabapentin can be changed to something else. Discussed that Lyrica is an option but at this point [name] is currently on several chronic medications which increases his risk for opioid related complications [name] will speak with his psychiatrist about possibly consolidating his psychotropic medications  8. High risk medication use Currently on testosterone replacement therapy - PSA, total and free - CBC with Differential/Platelet  9. Testosterone deficiency - PSA, total and free - CBC with Differential/Platelet  10. Screening for viral disease - HIV Antibody (routine testing w rflx)  11. Need for immunization against influenza - Flu Vaccine QUAD 22moIM (Fluarix, Fluzone & Alfiuria Quad PF)    No orders of the defined types were placed in this encounter.   Return in about 6 months (around 11/24/2021) for Chronic medical conditions.    47 minutes of total face to face time spent including median  intraservice time reviewing previous notes and test results, counseling patient on diagnosis and work up of back pain in addition to management of chronic medical conditions.Time also spent ordering medications, investigations and documenting in the chart.  All questions were answered to the patient's satisfaction    ECharlott Rakes MD, FAAFP. [facility]  05/27/2021, 1:47 PM

## 2021-05-27 NOTE — Progress Notes (Signed)
Having pain in lower back. balance issues/shaking Discuss substance abuse.

## 2021-05-28 LAB — CBC WITH DIFFERENTIAL/PLATELET
Basophils Absolute: 0 10*3/uL (ref 0.0–0.2)
Basos: 1 %
EOS (ABSOLUTE): 0.1 10*3/uL (ref 0.0–0.4)
Eos: 1 %
Hematocrit: 40.7 % (ref 37.5–51.0)
Hemoglobin: 13.7 g/dL (ref 13.0–17.7)
Immature Grans (Abs): 0.1 10*3/uL (ref 0.0–0.1)
Immature Granulocytes: 1 %
Lymphocytes Absolute: 1.7 10*3/uL (ref 0.7–3.1)
Lymphs: 34 %
MCH: 30.3 pg (ref 26.6–33.0)
MCHC: 33.7 g/dL (ref 31.5–35.7)
MCV: 90 fL (ref 79–97)
Monocytes Absolute: 0.5 10*3/uL (ref 0.1–0.9)
Monocytes: 9 %
Neutrophils Absolute: 2.7 10*3/uL (ref 1.4–7.0)
Neutrophils: 54 %
Platelets: 130 10*3/uL — ABNORMAL LOW (ref 150–450)
RBC: 4.52 x10E6/uL (ref 4.14–5.80)
RDW: 13.8 % (ref 11.6–15.4)
WBC: 5 10*3/uL (ref 3.4–10.8)

## 2021-05-28 LAB — CMP14+EGFR
ALT: 14 IU/L (ref 0–44)
AST: 21 IU/L (ref 0–40)
Albumin/Globulin Ratio: 2 (ref 1.2–2.2)
Albumin: 5.2 g/dL — ABNORMAL HIGH (ref 3.8–4.9)
Alkaline Phosphatase: 74 IU/L (ref 44–121)
BUN/Creatinine Ratio: 14 (ref 9–20)
BUN: 31 mg/dL — ABNORMAL HIGH (ref 6–24)
Bilirubin Total: 0.4 mg/dL (ref 0.0–1.2)
CO2: 25 mmol/L (ref 20–29)
Calcium: 9.7 mg/dL (ref 8.7–10.2)
Chloride: 97 mmol/L (ref 96–106)
Creatinine, Ser: 2.22 mg/dL — ABNORMAL HIGH (ref 0.76–1.27)
Globulin, Total: 2.6 g/dL (ref 1.5–4.5)
Glucose: 214 mg/dL — ABNORMAL HIGH (ref 70–99)
Potassium: 4.9 mmol/L (ref 3.5–5.2)
Sodium: 136 mmol/L (ref 134–144)
Total Protein: 7.8 g/dL (ref 6.0–8.5)
eGFR: 35 mL/min/{1.73_m2} — ABNORMAL LOW (ref >59)

## 2021-05-28 LAB — PSA, TOTAL AND FREE
PSA, Free Pct: 42.5 %
PSA, Free: 0.85 ng/mL
Prostate Specific Ag, Serum: 2 ng/mL (ref 0.0–4.0)

## 2021-05-28 LAB — HIV ANTIBODY (ROUTINE TESTING W REFLEX): HIV Screen 4th Generation wRfx: NONREACTIVE

## 2021-05-30 DIAGNOSIS — M47816 Spondylosis without myelopathy or radiculopathy, lumbar region: Secondary | ICD-10-CM | POA: Diagnosis not present

## 2021-05-30 LAB — HEMOGLOBIN A1C
Est. average glucose Bld gHb Est-mCnc: 157 mg/dL
Hgb A1c MFr Bld: 7.1 % — ABNORMAL HIGH (ref 4.8–5.6)

## 2021-05-30 LAB — SPECIMEN STATUS REPORT

## 2021-05-31 DIAGNOSIS — E1165 Type 2 diabetes mellitus with hyperglycemia: Secondary | ICD-10-CM | POA: Diagnosis not present

## 2021-06-05 ENCOUNTER — Other Ambulatory Visit: Payer: Self-pay | Admitting: Family Medicine

## 2021-06-05 DIAGNOSIS — M25552 Pain in left hip: Secondary | ICD-10-CM | POA: Diagnosis not present

## 2021-06-05 DIAGNOSIS — Z1159 Encounter for screening for other viral diseases: Secondary | ICD-10-CM | POA: Diagnosis not present

## 2021-06-05 DIAGNOSIS — M129 Arthropathy, unspecified: Secondary | ICD-10-CM | POA: Diagnosis not present

## 2021-06-05 DIAGNOSIS — G8929 Other chronic pain: Secondary | ICD-10-CM | POA: Diagnosis not present

## 2021-06-05 DIAGNOSIS — Z96643 Presence of artificial hip joint, bilateral: Secondary | ICD-10-CM | POA: Diagnosis not present

## 2021-06-05 DIAGNOSIS — M542 Cervicalgia: Secondary | ICD-10-CM | POA: Diagnosis not present

## 2021-06-05 DIAGNOSIS — M25551 Pain in right hip: Secondary | ICD-10-CM | POA: Diagnosis not present

## 2021-06-05 DIAGNOSIS — E612 Magnesium deficiency: Secondary | ICD-10-CM | POA: Diagnosis not present

## 2021-06-05 DIAGNOSIS — Z79899 Other long term (current) drug therapy: Secondary | ICD-10-CM | POA: Diagnosis not present

## 2021-06-05 DIAGNOSIS — M546 Pain in thoracic spine: Secondary | ICD-10-CM | POA: Diagnosis not present

## 2021-06-05 DIAGNOSIS — M545 Low back pain, unspecified: Secondary | ICD-10-CM | POA: Diagnosis not present

## 2021-06-05 DIAGNOSIS — M5136 Other intervertebral disc degeneration, lumbar region: Secondary | ICD-10-CM

## 2021-06-05 DIAGNOSIS — G8928 Other chronic postprocedural pain: Secondary | ICD-10-CM | POA: Diagnosis not present

## 2021-06-05 DIAGNOSIS — E559 Vitamin D deficiency, unspecified: Secondary | ICD-10-CM | POA: Diagnosis not present

## 2021-06-06 NOTE — Telephone Encounter (Signed)
Requested Prescriptions  Pending Prescriptions Disp Refills   varenicline (CHANTIX) 1 MG tablet [Pharmacy Med Name: VARENICLINE  1MG   TAB] 56 tablet 0    Sig: TAKE 1 TABLET BY MOUTH TWICE  DAILY     Psychiatry:  Drug Dependence Therapy - varenicline Failed - 06/05/2021  7:52 PM      Failed - Manual Review: Do not refill starter pack. 1 mg tabs may be extended up to one year if the patient has quit smoking but still feels at risk for relapse.      Failed - Cr in normal range and within 180 days    Creatinine, Ser  Date Value Ref Range Status  05/27/2021 2.22 (H) 0.76 - 1.27 mg/dL Final   Creatinine,U  Date Value Ref Range Status  04/18/2015 250.2 mg/dL Final   Creatinine, Urine  Date Value Ref Range Status  10/03/2015 35.87 mg/dL Final         Passed - Completed PHQ-2 or PHQ-9 in the last 360 days      Passed - Valid encounter within last 6 months    Recent Outpatient Visits          1 week ago Type 2 diabetes mellitus with stage 4 chronic kidney disease, with long-term current use of insulin ([place])   [place], [place], MD   4 months ago Type 2 diabetes mellitus with stage 4 chronic kidney disease, with long-term current use of insulin ([place])   [place], [place], [place]   6 months ago Encounter for [employer] annual wellness exam   [place], [place], MD   11 months ago Hypertriglyceridemia   [place], [place], MD   1 year ago Type 2 diabetes mellitus with stage 3a chronic kidney disease, with long-term current use of insulin ([place])   [place], MD      Future Appointments            In 5 months [name], MD [place]            tiZANidine (ZANAFLEX) 4 MG tablet [Pharmacy Med Name: tiZANidine HCl 4 MG Oral Tablet] 90 tablet 0     Sig: TAKE 1 TABLET BY MOUTH EVERY 8  HOURS AS NEEDED FOR MUSCLE  SPASM(S)     Not Delegated - Cardiovascular:  Alpha-2 Agonists - tizanidine Failed - 06/05/2021  7:52 PM      Failed - This refill cannot be delegated      Passed - Valid encounter within last 6 months    Recent Outpatient Visits          1 week ago Type 2 diabetes mellitus with stage 4 chronic kidney disease, with long-term current use of insulin ([place])   [place], [place], MD   4 months ago Type 2 diabetes mellitus with stage 4 chronic kidney disease, with long-term current use of insulin [facility])   [place], [place], [place]   6 months ago Encounter for [employer] annual wellness exam   [place], [place], MD   11 months ago Hypertriglyceridemia   [place], Charlane Ferretti, MD   1 year ago Type 2 diabetes mellitus with stage 3a chronic kidney disease, with long-term current use  of insulin [facility])   [place], MD      Future Appointments            In 5 months [name], MD [place]

## 2021-06-06 NOTE — Telephone Encounter (Signed)
Requested medications are due for refill today.  yes  Requested medications are on the active medications list.  yes  Last refill. 05/20/2021 #90 0 refills  Future visit scheduled.   yes  Notes to clinic.  Medication not delegated.    Requested Prescriptions  Pending Prescriptions Disp Refills   tiZANidine (ZANAFLEX) 4 MG tablet [Pharmacy Med Name: tiZANidine HCl 4 MG Oral Tablet] 90 tablet 0    Sig: TAKE 1 TABLET BY MOUTH EVERY 8  HOURS AS NEEDED FOR MUSCLE  SPASM(S)     Not Delegated - Cardiovascular:  Alpha-2 Agonists - tizanidine Failed - 06/05/2021  7:52 PM      Failed - This refill cannot be delegated      Passed - Valid encounter within last 6 months    Recent Outpatient Visits           1 week ago Type 2 diabetes mellitus with stage 4 chronic kidney disease, with long-term current use of insulin ([place])   [place], [place], MD   4 months ago Type 2 diabetes mellitus with stage 4 chronic kidney disease, with long-term current use of insulin ([place])   [place], [place], [place]   6 months ago Encounter for [employer] annual wellness exam   [place], [place], MD   11 months ago Hypertriglyceridemia   [place], [place], MD   1 year ago Type 2 diabetes mellitus with stage 3a chronic kidney disease, with long-term current use of insulin ([place])   [place], MD       Future Appointments             In 5 months [name], MD [place]            Signed Prescriptions Disp Refills   varenicline (CHANTIX) 1 MG tablet 56 tablet 0    Sig: TAKE 1 TABLET BY MOUTH TWICE  DAILY     Psychiatry:  Drug Dependence Therapy - varenicline Failed - 06/05/2021  7:52 PM      Failed - Manual Review: Do not refill starter pack. 1 mg tabs may be  extended up to one year if the patient has quit smoking but still feels at risk for relapse.      Failed - Cr in normal range and within 180 days    Creatinine, Ser  Date Value Ref Range Status  05/27/2021 2.22 (H) 0.76 - 1.27 mg/dL Final   Creatinine,U  Date Value Ref Range Status  04/18/2015 250.2 mg/dL Final   Creatinine, Urine  Date Value Ref Range Status  10/03/2015 35.87 mg/dL Final          Passed - Completed PHQ-2 or PHQ-9 in the last 360 days      Passed - Valid encounter within last 6 months    Recent Outpatient Visits           1 week ago Type 2 diabetes mellitus with stage 4 chronic kidney disease, with long-term current use of insulin ([place])   [place], [place], MD   4 months ago Type 2 diabetes mellitus with stage 4 chronic kidney disease, with long-term current use of insulin [facility])   [place], [place], [place]   6 months ago Encounter for [employer] annual wellness  exam   [place] [name], MD   11 months ago Hypertriglyceridemia   [place], [place], MD   1 year ago Type 2 diabetes mellitus with stage 3a chronic kidney disease, with long-term current use of insulin [facility])   [place], MD       Future Appointments             In 5 months [name], MD [place]

## 2021-06-11 ENCOUNTER — Other Ambulatory Visit: Payer: Self-pay

## 2021-06-11 ENCOUNTER — Ambulatory Visit (INDEPENDENT_AMBULATORY_CARE_PROVIDER_SITE_OTHER): Payer: Medicare Other | Admitting: Endocrinology

## 2021-06-11 VITALS — BP 110/66 | HR 119 | Ht 75.0 in | Wt 220.6 lb

## 2021-06-11 DIAGNOSIS — Z794 Long term (current) use of insulin: Secondary | ICD-10-CM

## 2021-06-11 DIAGNOSIS — E1122 Type 2 diabetes mellitus with diabetic chronic kidney disease: Secondary | ICD-10-CM

## 2021-06-11 DIAGNOSIS — N184 Chronic kidney disease, stage 4 (severe): Secondary | ICD-10-CM | POA: Diagnosis not present

## 2021-06-11 LAB — POCT GLYCOSYLATED HEMOGLOBIN (HGB A1C): Hemoglobin A1C: 7.8 % — AB (ref 4.0–5.6)

## 2021-06-11 MED ORDER — HUMULIN N KWIKPEN 100 UNIT/ML ~~LOC~~ SUPN: 45.0000 [IU] | PEN_INJECTOR | SUBCUTANEOUS | 3 refills | Status: DC

## 2021-06-11 NOTE — Progress Notes (Signed)
Subjective:    Patient ID: [name]., male    DOB: May 22, 1965, 56 y.o.   MRN: [id number]  HPI Pt returns for f/u of diabetes mellitus:  DM type: Insulin-requiring type 2. Dx'ed: 9509 Complications: PN and stage 4 CRI Therapy: insulin since 07/28/2015 DKA: never Severe hypoglycemia: never Pancreatitis: never Pancreatic imaging: normal on 2013-07-27 CT SDOH: Sister [name]) provides hx; rx has been limited by poor memory and frequent falls; Aide visits 10AM-2PM (Mon-Sat).  Pt gives his own insulin. Pt and sister say he sometimes misses the insulin; he eats meals at 10AM, 3PM, and 7PM.  However, pt says he usually eats throughout the day.  Other: none Interval history: Pt says he sometimes skips the Humalog because of fasting hypoglycemia.  I reviewed continuous glucose monitor data.  Glucose varies from 160-400.  It is in general highest at 12MN; it is lowest at 7AM.  It increases quickly from 7AM-11AM, then slowly from 2PM-12MNPt says he has not recently missed the insulins.   Past Medical History:  Diagnosis Date   ADD (attention deficit disorder)    Anxiety    Arthritis    right hip   Bipolar 1 disorder (HCC)    Blood in urine    CKD (chronic kidney disease), stage III (HCC)    Creatinine elevation    Dementia ([place])    "early onset" (08/04/2017)   Depression    bipolar [facility]   Diabetes mellitus without complication ([place])    Family history of anesthesia complication    pt is unsure , but pt father may have been difficult to arouse    HCAP (healthcare-associated pneumonia) 10/31/2012   History of kidney stones    Hypertension    Hypogonadism male    Liver fatty degeneration    Microscopic hematuria    hereditary s/p Urology eval   Neuromuscular disorder ([place])    feet neuropathy    Osteoarthritis of right hip 11/28/2011   2012 2015 s/p THR Severe  Dr Novella Olive     Pleural effusion 11/02/2012   Pneumonia 10-2012   Pneumonia, organism unspecified([place]) 11/02/2012    Polysubstance dependence, non-opioid, in remission ([place])    remote   Primary osteoarthritis of left hip 05/22/2015   PTSD (post-traumatic stress disorder)    SOCIAL ANXIETY DISORDER    Schizoaffective disorder ([place])    Substance abuse ([place])    Suicide attempt by multiple drug overdose ([place]) Jan 18, 2016   Grieving his cat's death Jul 28, 2015    Past Surgical History:  Procedure Laterality Date   BACK SURGERY     CLOSED REDUCTION METACARPAL WITH PERCUTANEOUS PINNING Right    LUMBAR [place] HIP ARTHROPLASTY Right 08/16/2013   Procedure: TOTAL HIP ARTHROPLASTY ANTERIOR APPROACH;  Surgeon: Hessie Dibble, MD;  Location: [facility];  Service: Orthopedics;  Laterality: Right;   TOTAL HIP ARTHROPLASTY Left 05/22/2015   Procedure: TOTAL HIP ARTHROPLASTY ANTERIOR APPROACH;  Surgeon: Melrose Nakayama, MD;  Location: [facility];  Service: Orthopedics;  Laterality: Left;    Social History   Socioeconomic History   Marital status: Single    Spouse name: Not on file   Number of children: 0   Years of education: Not on file   Highest education level: Some college, no degree  Occupational History   Occupation: disability  Tobacco Use   Smoking status: Former    Years: 0.25    Types: Cigarettes  Quit date: 03/22/2019    Years since quitting: 2.2   Smokeless tobacco: Never  Vaping Use   Vaping Use: Never used  Substance and Sexual Activity   Alcohol use: Not Currently   Drug use: No    Comment: hx of marijuana/cocaine/crack use but sober since 20's   Sexual activity: Not Currently  Other Topics Concern   Not on file  Social History Narrative   05/08/2021 lives alone, sister [name] helps with meds, he has some in home care, lived with sister until Nov 2020   Caffeine- sodas, amount  varies   regular exercise-no   Social Determinants of Health   Financial Resource Strain: Not on file  Food Insecurity: Not on file  Transportation Needs: Not on file  Physical Activity:  Not on file  Stress: Not on file  Social Connections: Not on file  Intimate Partner Violence: Not on file    Current Outpatient Medications on File Prior to Visit  Medication Sig Dispense Refill   alfuzosin (UROXATRAL) 10 MG 24 hr tablet Take 10 mg by mouth at bedtime.      ARIPiprazole (ABILIFY) 5 MG tablet TAKE 1 TABLET BY MOUTH DAILY FOR DEPRESSION (Patient taking differently: Take 5 mg by mouth daily.) 30 tablet 0   atorvastatin (LIPITOR) 10 MG tablet TAKE 1 TABLET BY MOUTH AT  BEDTIME 90 tablet 3   buPROPion (WELLBUTRIN XL) 300 MG 24 hr tablet Take 1 tablet (300 mg total) by mouth daily. 30 tablet 0   busPIRone (BUSPAR) 10 MG tablet Take 10 mg by mouth 2 (two) times daily.     busPIRone (BUSPAR) 15 MG tablet Take 15 mg by mouth 2 (two) times daily.     chlorhexidine (PERIDEX) 0.12 % solution SMARTSIG:By Mouth     ciclopirox (PENLAC) 8 % solution APPLY DAILY TO NAILS. ONCE  A WEEK FILE TOENAIL AND  CLEAN WITH ALCOHOL AND  REAPPLY 13.2 mL 0   clonazePAM (KLONOPIN) 0.5 MG tablet Take 0.5 mg by mouth 2 (two) times daily as needed.     Continuous Blood Gluc Receiver (FREESTYLE LIBRE READER) DEVI Use as directed (Patient not taking: Reported on 06/13/2021) 1 each each   Continuous Blood Gluc Sensor (FREESTYLE LIBRE 14 DAY SENSOR) MISC Use as directed (Patient not taking: Reported on 06/13/2021) 2 each 4   divalproex (DEPAKOTE ER) 500 MG 24 hr tablet Take 500 mg by mouth daily.     fenofibrate (TRICOR) 145 MG tablet TAKE 1 TABLET BY MOUTH DAILY 90 tablet 3   FLUARIX QUADRIVALENT 0.5 ML injection      fluticasone (FLONASE) 50 MCG/ACT nasal spray Place 1 spray into both nostrils daily. 48 g 3   gabapentin (NEURONTIN) 300 MG capsule TAKE 1 CAPSULE BY MOUTH TWICE  DAILY (Patient not taking: Reported on 06/13/2021) 180 capsule 2   glucose blood (ACCU-CHEK GUIDE) test strip CHECK SUGAR FOUR TIMES DAILY 100 each 12   Insulin Pen Needle (B-D UF III MINI PEN NEEDLES) 31G X 5 MM MISC USE FOUR TIMES DAILY  120 each 12   lisinopril (ZESTRIL) 5 MG tablet TAKE 1 TABLET BY MOUTH  DAILY 90 tablet 3   mupirocin ointment (BACTROBAN) 2 % Apply 1 application topically 2 (two) times daily. 30 g 2   nicotine (NICODERM CQ) 14 mg/24hr patch Place 1 patch (14 mg total) onto the skin daily. Then 7 mg/24 hr patch for 1 month (Patient not taking: Reported on 06/13/2021) 28 patch 1   oxyCODONE-acetaminophen (PERCOCET/ROXICET) 5-325 MG  tablet Take by mouth.     pantoprazole (PROTONIX) 40 MG tablet TAKE 1 TABLET BY MOUTH  DAILY 90 tablet 3   QUEtiapine (SEROQUEL) 400 MG tablet Take 400 mg by mouth as needed.     tadalafil (CIALIS) 5 MG tablet TAKE ONE TABLET BY MOUTH EVERY DAY FOR BPH 90 tablet 1   Testosterone 20.25 MG/ACT (1.62%) GEL Apply topically once daily to the upper arms 75 g 5   tiZANidine (ZANAFLEX) 4 MG tablet TAKE 1 TABLET BY MOUTH EVERY 8  HOURS AS NEEDED FOR MUSCLE  SPASM(S) 90 tablet 0   traMADol (ULTRAM) 50 MG tablet Take 1 tablet (50 mg total) by mouth every 12 (twelve) hours as needed. (Patient not taking: Reported on 06/13/2021) 60 tablet 1   traZODone (DESYREL) 150 MG tablet Take 1 tablet (150 mg total) by mouth at bedtime. 90 tablet 1   varenicline (CHANTIX) 1 MG tablet TAKE 1 TABLET BY MOUTH TWICE  DAILY (Patient not taking: Reported on 06/13/2021) 56 tablet 0   No current facility-administered medications on file prior to visit.    Allergies  Allergen Reactions   Vicodin [Hydrocodone-Acetaminophen] Itching    Family History  Problem Relation Age of Onset   Diabetes Father    Cancer Mother        died of melanoma with mets   Cervical cancer Sister    Diabetes Sister    Other Neg Hx        hypogonadism   Colon cancer Neg Hx    Colon polyps Neg Hx    Esophageal cancer Neg Hx    Rectal cancer Neg Hx    Stomach cancer Neg Hx     BP 110/66    Pulse (!) 119    Ht 6\' 3"  (1.905 m)    Wt 220 lb 9.6 oz (100.1 kg)    SpO2 93%    BMI 27.57 kg/m    Review of Systems He denies  hypoglycemia.      Objective:   Physical Exam  Lab Results  Component Value Date   TSH 1.83 08/30/2020    Lab Results  Component Value Date   HGBA1C 7.8 (A) 06/11/2021      Assessment & Plan:  Insulin-requiring type 2 DM Hypoglycemia, due to insulin: The pattern of his cbg's indicates he needs a faster-acting qam insulin. Tachycardia, chronic: we'll follow.   Patient Instructions  I have sent a prescription to your pharmacy, to change both insulins to NPH, 45 units each morning. please call us if your blood sugar goes below 70, or if your blood sugar is mostly over 200.   Please come back for a follow-up appointment in 1 month

## 2021-06-11 NOTE — Patient Instructions (Addendum)
I have sent a prescription to your pharmacy, to change both insulins to NPH, 45 units each morning. please call us if your blood sugar goes below 70, or if your blood sugar is mostly over 200.   Please come back for a follow-up appointment in 1 month

## 2021-06-13 ENCOUNTER — Encounter: Payer: Medicare Other | Attending: Endocrinology | Admitting: Dietician

## 2021-06-13 ENCOUNTER — Other Ambulatory Visit: Payer: Self-pay

## 2021-06-13 ENCOUNTER — Encounter: Payer: Self-pay | Admitting: Dietician

## 2021-06-13 DIAGNOSIS — Z794 Long term (current) use of insulin: Secondary | ICD-10-CM | POA: Insufficient documentation

## 2021-06-13 DIAGNOSIS — E1122 Type 2 diabetes mellitus with diabetic chronic kidney disease: Secondary | ICD-10-CM | POA: Insufficient documentation

## 2021-06-13 DIAGNOSIS — Z713 Dietary counseling and surveillance: Secondary | ICD-10-CM | POA: Insufficient documentation

## 2021-06-13 DIAGNOSIS — N1831 Chronic kidney disease, stage 3a: Secondary | ICD-10-CM | POA: Diagnosis not present

## 2021-06-13 NOTE — Progress Notes (Signed)
Diabetes Self-Management Education  Visit Type: First/Initial  Appt. Start Time: 1545 Appt. End Time: 8921  06/14/2021  [name], identified by name and date of birth, is a 56 y.o. male with a diagnosis of Diabetes: Type 2.   ASSESSMENT Patient is here today alone. He reports that his appetite is poor. He states that he is not following a special diet. He is afraid that I will take away the things that he likes such as cheese, drinks, equate protein shakes Kidney labs reviewed and he has had a low GFR of 30 in 2016. No upper teeth and has upper dentures but they do not fit well.  This makes it difficult to eat at times. Diet quality is poor. Fasting glucose 244 this am and 262 and higher in office.  History include:  Type 2 diabetes (insulin requiring since 2012), HTN, neuropathy, CKD, bipolar disorder, early onset dementia vs parkinson's per patient.   Medications include:  NPH 45 units q HS Labs:  A1C:  7.8% 06/11/2021, 7.1% 05/27/2021, 8.1% 10/2020, BUN 31, creatinine 2.22, potassium 4.9, eGFR 35 on 05/27/2021, vitamin D 44 02/06/2021. FreeStyle Libre2:  time in range for the past 30 days 31% (70-180), 30% (191-240), 39% (>240).  Average Glucose 226  Weight hx: 6'3" 217 lbs 06/13/2021 Goal weight under 200 Patient reports some tendency of Anorexia (both due to low appetite and behaviors which are turned on when someone calls him fat)  Patient lives alone.  He walks with a walker.  He does online shopping and has it delivered.  A CNA is there 3 hours per day (Monday- Friday) who helps him with cooking, bathing, and household chores.  She will remind him to take his medication.  His sister organizes his medication.  He forgets his insulin at times.  Orders take out on weekends. He no longer drives and takes Lyft. He is on disability. Does not eat eggs, fish, [place] Height 6' 3"  (1.905 m), weight 217 lb (98.4 kg). Body mass index is 27.12 kg/m.   Diabetes Self-Management  Education - 06/13/21 1631       Visit Information   Visit Type First/Initial      Initial Visit   Diabetes Type Type 2    Are you currently following a meal plan? No    Are you taking your medications as prescribed? Yes    Date Diagnosed 2012      Health Coping   How would you rate your overall health? Fair      Psychosocial Assessment   Self-care barriers Debilitated state due to current medical condition    Self-management support Doctor's office;Family   CNA   Other persons present Patient    Patient Concerns Nutrition/Meal planning    Special Needs None    Preferred Learning Style No preference indicated    Learning Readiness Ready    How often do you need to have someone help you when you read instructions, pamphlets, or other written materials from your doctor or pharmacy? 1 - Never    What is the last grade level you completed in school? some college      Pre-Education Assessment   Patient understands the diabetes disease and treatment process. Needs Instruction    Patient understands incorporating nutritional management into lifestyle. Needs Instruction    Patient undertands incorporating physical activity into lifestyle. Needs Instruction    Patient understands using medications safely. Needs Instruction    Patient understands monitoring blood glucose, interpreting and using results  Needs Instruction    Patient understands prevention, detection, and treatment of acute complications. Needs Instruction    Patient understands prevention, detection, and treatment of chronic complications. Needs Instruction    Patient understands how to develop strategies to address psychosocial issues. Needs Instruction    Patient understands how to develop strategies to promote health/change behavior. Needs Instruction      Complications   Last HgB A1C per patient/outside source 7.8 %   7.8% 06/11/2021 increased form 7.1% 05/27/2021   How often do you check your blood sugar? > 4 times/day     Fasting Blood glucose range (mg/dL) >200   244 this am   Number of hypoglycemic episodes per month 0    Number of hyperglycemic episodes per week 21    Can you tell when your blood sugar is high? No    Have you had a dilated eye exam in the past 12 months? No    Have you had a dental exam in the past 12 months? Yes    Are you checking your feet? Yes    How many days per week are you checking your feet? 1      Dietary Intake   Breakfast 1 pepper jack cheese stick OR bacon, cheese, toast    Snack (morning) none    Lunch 2 sausage patties    Snack (afternoon) equate protein shake    Dinner cheese stick. "junk food"    Snack (evening) honey roasted cashews    Beverage(s) water, OJ, sprite zero, coffee with splenda and regular creamer, sweet tea      Exercise   Exercise Type ADL's      Patient Education   Previous Diabetes Education No    Disease state  --   renal basics, beverage choices and impact on glucose   Nutrition management  Other (comment);Role of diet in the treatment of diabetes and the relationship between the three main macronutrients and blood glucose level;Meal options for control of blood glucose level and chronic complications.    Monitoring Taught/evaluated SMBG meter.;Daily foot exams    Acute complications Discussed and identified patients' treatment of hyperglycemia.    Chronic complications Relationship between chronic complications and blood glucose control    Psychosocial adjustment Worked with patient to identify barriers to care and solutions      Individualized Goals (developed by patient)   Nutrition General guidelines for healthy choices and portions discussed    Physical Activity Not Applicable    Medications take my medication as prescribed   rotate injection sites   Monitoring  test my blood glucose as discussed    Reducing Risk examine blood glucose patterns;do foot checks daily      Post-Education Assessment   Patient understands the diabetes  disease and treatment process. Demonstrates understanding / competency    Patient understands incorporating nutritional management into lifestyle. Needs Review    Patient undertands incorporating physical activity into lifestyle. Demonstrates understanding / competency    Patient understands using medications safely. Demonstrates understanding / competency    Patient understands monitoring blood glucose, interpreting and using results Demonstrates understanding / competency    Patient understands prevention, detection, and treatment of acute complications. Demonstrates understanding / competency    Patient understands prevention, detection, and treatment of chronic complications. Demonstrates understanding / competency    Patient understands how to develop strategies to address psychosocial issues. Needs Review    Patient understands how to develop strategies to promote health/change behavior. Needs Review  Outcomes   Expected Outcomes Demonstrated interest in learning. Expect positive outcomes    Future DMSE 2 months    Program Status Not Completed             Individualized Plan for Diabetes Self-Management Training:   Learning Objective:  Patient will have a greater understanding of diabetes self-management. Patient education plan is to attend individual and/or group sessions per assessed needs and concerns.   Plan:   Patient Instructions  600 mg ALA twice daily (consider for neuropathy - nutrition supplement)  Rotate insulin injection sites  Low sodium  Avoid added salt      [place] low sodium pasta sauce Choose beverages without sugar or carbohydrates. Limit juice to 4 oz occasionally.  Juice will make your blood sugar go up. Improve nutrition quality.  Add a fruit at every meal  Add 1-2 vegetables for lunch and dinner  Small amounts of lean protein (chicken, beef, pork)      Expected Outcomes:  Demonstrated interest in learning. Expect positive  outcomes  Education material provided: NKD National Kidney Diet Placemat for those not on dialysis  If problems or questions, patient to contact team via:  Phone  Future DSME appointment: 2 months

## 2021-06-13 NOTE — Patient Instructions (Addendum)
600 mg ALA twice daily (consider for neuropathy - nutrition supplement)   Rotate insulin injection sites Ask your CNA of any foot issues that she sees when she is doing your care.  Low sodium  Avoid added salt      [place]ate low sodium pasta sauce Choose beverages without sugar or carbohydrates. Limit juice to 4 oz occasionally.  Juice will make your blood sugar go up. Improve nutrition quality.  Add a fruit at every meal  Add 1-2 vegetables for lunch and dinner  Small amounts of lean protein (chicken, beef, pork)

## 2021-06-14 ENCOUNTER — Encounter: Payer: Self-pay | Admitting: Endocrinology

## 2021-06-16 ENCOUNTER — Encounter (HOSPITAL_COMMUNITY): Payer: Self-pay | Admitting: Emergency Medicine

## 2021-06-16 ENCOUNTER — Other Ambulatory Visit: Payer: Self-pay

## 2021-06-16 ENCOUNTER — Emergency Department (HOSPITAL_COMMUNITY)
Admission: EM | Admit: 2021-06-16 | Discharge: 2021-06-16 | Disposition: A | Discharge status: Home / Self Care | Payer: Medicare Other | Attending: Emergency Medicine | Admitting: Emergency Medicine

## 2021-06-16 DIAGNOSIS — S8001XA Contusion of right knee, initial encounter: Secondary | ICD-10-CM | POA: Insufficient documentation

## 2021-06-16 DIAGNOSIS — S8002XA Contusion of left knee, initial encounter: Secondary | ICD-10-CM | POA: Diagnosis not present

## 2021-06-16 DIAGNOSIS — E119 Type 2 diabetes mellitus without complications: Secondary | ICD-10-CM | POA: Diagnosis not present

## 2021-06-16 DIAGNOSIS — Z743 Need for continuous supervision: Secondary | ICD-10-CM | POA: Diagnosis not present

## 2021-06-16 DIAGNOSIS — Y9301 Activity, walking, marching and hiking: Secondary | ICD-10-CM | POA: Diagnosis not present

## 2021-06-16 DIAGNOSIS — R748 Abnormal levels of other serum enzymes: Secondary | ICD-10-CM | POA: Insufficient documentation

## 2021-06-16 DIAGNOSIS — R5381 Other malaise: Secondary | ICD-10-CM | POA: Diagnosis not present

## 2021-06-16 DIAGNOSIS — G4489 Other headache syndrome: Secondary | ICD-10-CM | POA: Diagnosis not present

## 2021-06-16 DIAGNOSIS — Z79899 Other long term (current) drug therapy: Secondary | ICD-10-CM | POA: Insufficient documentation

## 2021-06-16 DIAGNOSIS — Z043 Encounter for examination and observation following other accident: Secondary | ICD-10-CM | POA: Diagnosis not present

## 2021-06-16 DIAGNOSIS — S5011XA Contusion of right forearm, initial encounter: Secondary | ICD-10-CM | POA: Diagnosis not present

## 2021-06-16 DIAGNOSIS — Y92009 Unspecified place in unspecified non-institutional (private) residence as the place of occurrence of the external cause: Secondary | ICD-10-CM | POA: Diagnosis not present

## 2021-06-16 DIAGNOSIS — Z794 Long term (current) use of insulin: Secondary | ICD-10-CM | POA: Diagnosis not present

## 2021-06-16 DIAGNOSIS — R5383 Other fatigue: Secondary | ICD-10-CM | POA: Diagnosis not present

## 2021-06-16 DIAGNOSIS — S5012XA Contusion of left forearm, initial encounter: Secondary | ICD-10-CM | POA: Diagnosis not present

## 2021-06-16 DIAGNOSIS — W19XXXA Unspecified fall, initial encounter: Secondary | ICD-10-CM | POA: Insufficient documentation

## 2021-06-16 DIAGNOSIS — S59912A Unspecified injury of left forearm, initial encounter: Secondary | ICD-10-CM | POA: Diagnosis present

## 2021-06-16 DIAGNOSIS — R58 Hemorrhage, not elsewhere classified: Secondary | ICD-10-CM | POA: Diagnosis not present

## 2021-06-16 DIAGNOSIS — M25519 Pain in unspecified shoulder: Secondary | ICD-10-CM | POA: Diagnosis not present

## 2021-06-16 DIAGNOSIS — S0990XA Unspecified injury of head, initial encounter: Secondary | ICD-10-CM | POA: Diagnosis not present

## 2021-06-16 LAB — CBC WITH DIFFERENTIAL/PLATELET
Abs Immature Granulocytes: 0.04 10*3/uL (ref 0.00–0.07)
Basophils Absolute: 0 10*3/uL (ref 0.0–0.1)
Basophils Relative: 0 %
Eosinophils Absolute: 0 10*3/uL (ref 0.0–0.5)
Eosinophils Relative: 0 %
HCT: 36.8 % — ABNORMAL LOW (ref 39.0–52.0)
Hemoglobin: 12.6 g/dL — ABNORMAL LOW (ref 13.0–17.0)
Immature Granulocytes: 1 %
Lymphocytes Relative: 13 %
Lymphs Abs: 0.9 10*3/uL (ref 0.7–4.0)
MCH: 30.1 pg (ref 26.0–34.0)
MCHC: 34.2 g/dL (ref 30.0–36.0)
MCV: 87.8 fL (ref 80.0–100.0)
Monocytes Absolute: 0.7 10*3/uL (ref 0.1–1.0)
Monocytes Relative: 10 %
Neutro Abs: 5.2 10*3/uL (ref 1.7–7.7)
Neutrophils Relative %: 76 %
Platelets: 137 10*3/uL — ABNORMAL LOW (ref 150–400)
RBC: 4.19 MIL/uL — ABNORMAL LOW (ref 4.22–5.81)
RDW: 12.2 % (ref 11.5–15.5)
WBC: 6.8 10*3/uL (ref 4.0–10.5)
nRBC: 0 % (ref 0.0–0.2)

## 2021-06-16 LAB — BASIC METABOLIC PANEL
Anion gap: 12 (ref 5–15)
BUN: 32 mg/dL — ABNORMAL HIGH (ref 6–20)
CO2: 24 mmol/L (ref 22–32)
Calcium: 9.6 mg/dL (ref 8.9–10.3)
Chloride: 95 mmol/L — ABNORMAL LOW (ref 98–111)
Creatinine, Ser: 2.47 mg/dL — ABNORMAL HIGH (ref 0.61–1.24)
GFR, Estimated: 30 mL/min — ABNORMAL LOW (ref >60)
Glucose, Bld: 154 mg/dL — ABNORMAL HIGH (ref 70–99)
Potassium: 5.1 mmol/L (ref 3.5–5.1)
Sodium: 131 mmol/L — ABNORMAL LOW (ref 135–145)

## 2021-06-16 LAB — CK: Total CK: 718 U/L — ABNORMAL HIGH (ref 49–397)

## 2021-06-16 LAB — VALPROIC ACID LEVEL: Valproic Acid Lvl: 37 ug/mL — ABNORMAL LOW (ref 50.0–100.0)

## 2021-06-16 MED ORDER — SODIUM CHLORIDE 0.9 % IV BOLUS
1000.0000 mL | Freq: Once | INTRAVENOUS | Status: AC
  Administered 2021-06-16: 1000 mL via INTRAVENOUS

## 2021-06-16 NOTE — TOC Initial Note (Signed)
Transition of Care (TOC) - Initial/Assessment Note    Patient Details  Name: [name]. MRN: [id number] Date of Birth: December 12, 1965  Transition of Care [facility]) CM/SW Contact:    Verdell Carmine, RN Phone Number: 06/16/2021, 3:44 PM  Clinical Narrative:                 Presented with falls. Patient lives alone in a apartment. He states the falls happen more at night. Visible abrasions on right arm, right knee. He has a aide that comes in 2 hours a day 1200-1400. He has a rollator walker and a wheelchair at home.  Recommend PT OT for gait training, as well as RN and social work for home health medication survey and disease management.  Also recommend 3:1 for bedside. Patient creatinine is elevated, awaiting disposition via MD. Team messaged with recomendations   Expected Discharge Plan: [place] Barriers to Discharge: Continued Medical Work up   Patient Goals and CMS Choice        Expected Discharge Plan and Services Expected Discharge Plan: [place]   Discharge Planning Services: CM Consult                                          Prior Living Arrangements/Services   Lives with:: Self Patient language and need for interpreter reviewed:: Yes Do you feel safe going back to the place where you live?: Yes      Need for Family Participation in Patient Care: Yes (Comment) Care giver support system in place?: Yes (comment) Current home services: Homehealth aide Criminal Activity/Legal Involvement Pertinent to Current Situation/Hospitalization: No - Comment as needed  Activities of Daily Living      Permission Sought/Granted   Permission granted to share information with : Yes, Verbal Permission Granted              Emotional Assessment     Affect (typically observed): Anxious Orientation: : Oriented to Self, Oriented to Place, Oriented to  Time, Oriented to Situation Alcohol / Substance Use: Not  Applicable    Admission diagnosis:  Falls Patient Active Problem List   Diagnosis Date Noted   Diabetes ([place]) 01/26/2021   Tachycardia 08/30/2020   Pressure injury of skin 01/02/2020   Dehydration    Rhabdomyolysis 01/01/2020   Recurrent falls 05/15/2019   AKI (acute kidney injury) ([place]) 05/14/2019   Closed displaced fracture of lateral malleolus of left fibula 08/02/2017   BPH (benign prostatic hyperplasia) 07/24/2017   Hemorrhoid 05/27/2017   Shoulder arthritis 05/27/2017   Diabetic neuropathy ([place]) 05/27/2017   Degenerative disc disease, lumbar 02/04/2017   Constipation    History of seizures 09/20/2016   Acute lower UTI    Urinary retention    HLD (hyperlipidemia) 09/07/2016   GERD (gastroesophageal reflux disease) 09/07/2016   PTSD (post-traumatic stress disorder) 09/07/2016   Fall 09/07/2016   Chronic kidney disease    Anorexia nervosa, restricting type, in full remission, moderate 01/05/2016   Ataxia 10/14/2015   Hypokalemia 10/03/2015   Hemiparesis ([place])    Dyslipidemia 05/31/2014   Insomnia 11/11/2013   Degenerative joint disease (DJD) of hip 08/16/2013   Acute midline low back pain without sciatica 05/20/2011   Erectile dysfunction 02/17/2011   Constipation - functional 02/17/2011   Hypogonadism male 05/20/2010   Demoralization and apathy 05/20/2010   Schizoaffective disorder, bipolar  type ([place]) 01/18/2010   Right shoulder pain 01/18/2010   Anxiety state 12/05/2006   Essential hypertension 12/05/2006   PCP:  [name], MD Pharmacy:   [facility] ([place], [facility] [address] [place] [address] [place] [address] Phone: [phone] Fax: [phone]  [facility] ([facility] ) - [place], [place] [address] Phone: [phone] Fax: [place], [place] - [address] [place] [address] Phone: [phone] Fax: [phone]     Social Determinants of Health (SDOH) Interventions    Readmission Risk Interventions Readmission Risk Prevention Plan 01/02/2020  Transportation Screening Complete  PCP or Specialist Appt within 3-5 Days Complete  HRI or [place] Complete  Social Work Consult for [place] Planning/Counseling Complete  Palliative Care Screening Not Applicable  Medication Review (RN Care Manager) Referral to Pharmacy  Some recent data might be hidden

## 2021-06-16 NOTE — Discharge Instructions (Addendum)
You are seen in the ER for falls.  The work-up in the ER is reassuring.  We recommend that you follow-up with your primary care doctor in 1 week  We have requested home health and physical therapy to visit you at your home with the goal of improving your quality of life and reducing unnecessary falls.  Please have your primary care doctor review her medications to see if anything needs to be discontinued.  Please take great care and preventing unnecessary falls. Return to the ER if you notice that ambulating/moving around your house is becoming dangerous.

## 2021-06-16 NOTE — ED Notes (Signed)
[facility] called; next in line

## 2021-06-16 NOTE — ED Triage Notes (Signed)
Patient coming from home, complaint of multiple falls this morning. Pt states is prone to falls. Complaining of a headache and neck pain.

## 2021-06-16 NOTE — ED Provider Notes (Signed)
[facility] EMERGENCY DEPARTMENT Provider Note   CSN: [id number] Arrival date & time: 06/16/21  1131     History  Chief Complaint  Patient presents with   [name]. is a 56 y.o. male.  HPI    56 year old male comes in with chief complaint of fall. 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  Patient indicates that yesterday and today he has had increased falls, which made him come to the ER.  He denies any change in medications besides perhaps tramadol discontinued and Vicodin started instead.   Patient denies any recent illnesses including URI-like symptoms, nausea, vomiting, abdominal pain.  He denies any fevers or chills.  He lives at home by himself.  He gets home aide to visit him every day for few hours to assist.  He uses a walker to get around.  He indicates that on occasions when he is walking, the legs are giving out leading him to falls.  He does not have any focal weakness.   Home Medications Prior to Admission medications   Medication Sig Start Date End Date Taking? Authorizing Provider  alfuzosin (UROXATRAL) 10 MG 24 hr tablet Take 10 mg by mouth at bedtime.  12/31/19   [provider]  ARIPiprazole (ABILIFY) 5 MG tablet TAKE 1 TABLET BY MOUTH DAILY FOR DEPRESSION Patient taking differently: Take 5 mg by mouth daily. 06/23/18   [name], MD  atorvastatin (LIPITOR) 10 MG tablet TAKE 1 TABLET BY MOUTH AT  BEDTIME 02/22/21   [name], MD  buPROPion (WELLBUTRIN XL) 300 MG 24 hr tablet Take 1 tablet (300 mg total) by mouth daily. 10/13/16   Elgergawy, Silver Huguenin, MD  busPIRone (BUSPAR) 10 MG tablet Take 10 mg by mouth 2 (two) times daily. 07/24/20   [provider]  busPIRone (BUSPAR) 15 MG tablet Take 15 mg by mouth 2 (two) times daily.    [provider]   chlorhexidine (PERIDEX) 0.12 % solution SMARTSIG:By Mouth 09/20/20   [provider]  ciclopirox (PENLAC) 8 % solution APPLY DAILY TO NAILS. ONCE  A WEEK FILE TOENAIL AND  CLEAN WITH ALCOHOL AND  REAPPLY 04/18/21   Trula Slade, DPM  clonazePAM (KLONOPIN) 0.5 MG tablet Take 0.5 mg by mouth 2 (two) times daily as needed. 09/11/20   [provider]  Continuous Blood Gluc Receiver (FREESTYLE LIBRE READER) DEVI Use as directed Patient not taking: Reported on 06/13/2021 07/20/20   [name], MD  Continuous Blood Gluc Sensor (FREESTYLE LIBRE 14 DAY SENSOR) MISC Use as directed Patient not taking: Reported on 06/13/2021 01/22/21   [name], MD  Continuous Blood Gluc Sensor (FREESTYLE LIBRE 2 SENSOR) MISC by Does not apply route.    [provider]  divalproex (DEPAKOTE ER) 500 MG 24 hr tablet Take 500 mg by mouth daily. 01/29/20   [provider]  fenofibrate (TRICOR) 145 MG tablet TAKE 1 TABLET BY MOUTH DAILY 04/28/21   [name], MD  FLUARIX QUADRIVALENT 0.5 ML injection  04/04/20   [provider]  fluticasone (FLONASE) 50 MCG/ACT nasal spray Place 1 spray into both nostrils daily. 07/09/20   [name], MD  gabapentin (NEURONTIN) 300 MG capsule TAKE 1 CAPSULE BY MOUTH TWICE  DAILY Patient not taking: Reported on 06/13/2021 05/06/21   [name], MD  glucose blood (ACCU-CHEK GUIDE) test strip CHECK  SUGAR FOUR TIMES DAILY 07/22/18   [name], MD  Insulin NPH, Human,, Isophane, (HUMULIN N KWIKPEN) 100 UNIT/ML Kiwkpen Inject 45 Units into the skin every morning. And pen needles 1/day 06/11/21   Renato Shin, MD  Insulin Pen Needle (B-D UF III MINI PEN NEEDLES) 31G X 5 MM MISC USE FOUR TIMES DAILY 07/27/20   [name], MD  lisinopril (ZESTRIL) 5 MG tablet TAKE 1 TABLET BY MOUTH  DAILY 02/26/21   [name], MD  mupirocin ointment (BACTROBAN) 2 % Apply 1 application topically 2 (two) times daily. 05/29/20   Trula Slade,  DPM  nicotine (NICODERM CQ) 14 mg/24hr patch Place 1 patch (14 mg total) onto the skin daily. Then 7 mg/24 hr patch for 1 month Patient not taking: Reported on 06/13/2021 05/11/20   Ladell Pier, MD  oxyCODONE-acetaminophen (PERCOCET/ROXICET) 5-325 MG tablet Take by mouth. 08/28/20   [provider]  pantoprazole (PROTONIX) 40 MG tablet TAKE 1 TABLET BY MOUTH  DAILY 03/26/21   [name], MD  polyethylene glycol (MIRALAX / GLYCOLAX) 17 g packet Take 17 g by mouth daily.    [provider]  QUEtiapine (SEROQUEL) 400 MG tablet Take 400 mg by mouth as needed. 01/20/20   [provider]  tadalafil (CIALIS) 5 MG tablet TAKE ONE TABLET BY MOUTH EVERY DAY FOR BPH 07/09/20   [name], MD  Testosterone 20.25 MG/ACT (1.62%) GEL Apply topically once daily to the upper arms 05/24/21   [place], Enobong, MD  tiZANidine (ZANAFLEX) 4 MG tablet TAKE 1 TABLET BY MOUTH EVERY 8  HOURS AS NEEDED FOR MUSCLE  SPASM(S) 06/06/21   [name], MD  traMADol (ULTRAM) 50 MG tablet Take 1 tablet (50 mg total) by mouth every 12 (twelve) hours as needed. Patient not taking: Reported on 06/13/2021 05/27/21   [name], MD  traZODone (DESYREL) 150 MG tablet Take 1 tablet (150 mg total) by mouth at bedtime. 05/11/20   Ladell Pier, MD  varenicline (CHANTIX) 1 MG tablet TAKE 1 TABLET BY MOUTH TWICE  DAILY Patient not taking: Reported on 06/13/2021 06/06/21   [name], MD      Allergies    Vicodin [hydrocodone-acetaminophen]    Review of Systems   Review of Systems  All other systems reviewed and are negative.  Physical Exam Updated Vital Signs BP 127/86    Pulse (!) 101    Temp 99.1 F (37.3 C) (Oral)    Resp 18    SpO2 93%  Physical Exam Vitals and nursing note reviewed.  Constitutional:      Appearance: He is well-developed.  HENT:     Head: Atraumatic.  Neck:     Comments: No midline c-spine tenderness, pt able to turn head to 45 degrees bilaterally without any  pain and able to flex neck to the chest and extend without any pain or neurologic symptoms.  Cardiovascular:     Rate and Rhythm: Normal rate.  Pulmonary:     Effort: Pulmonary effort is normal.  Musculoskeletal:        General: Signs of injury present. No tenderness or deformity.     Cervical back: Neck supple.     Comments: Patient has ecchymosis over his forearms bilaterally and patella bilaterally  Skin:    General: Skin is warm.     Findings: Bruising present.  Neurological:     Mental Status: He is alert and oriented to person, place, and time.     Comments: Patient's upper  and lower extremity strength is 4+ out of 5 and equal, patient has no focal numbness over the upper or lower extremities, patient has mild tremors in the upper extremity bilaterally, diminished patellar reflexes bilaterally, no cogwheel rigidity    ED Results / Procedures / Treatments   Labs (all labs ordered are listed, but only abnormal results are displayed) Labs Reviewed  BASIC METABOLIC PANEL - Abnormal; Notable for the following components:      Result Value   Sodium 131 (*)    Chloride 95 (*)    Glucose, Bld 154 (*)    BUN 32 (*)    Creatinine, Ser 2.47 (*)    GFR, Estimated 30 (*)    All other components within normal limits  CBC WITH DIFFERENTIAL/PLATELET - Abnormal; Notable for the following components:   RBC 4.19 (*)    Hemoglobin 12.6 (*)    HCT 36.8 (*)    Platelets 137 (*)    All other components within normal limits  VALPROIC ACID LEVEL - Abnormal; Notable for the following components:   Valproic Acid Lvl 37 (*)    All other components within normal limits  CK - Abnormal; Notable for the following components:   Total CK 718 (*)    All other components within normal limits    EKG None  Radiology No results found.  Procedures Procedures    Medications Ordered in ED Medications  sodium chloride 0.9 % bolus 1,000 mL (1,000 mLs Intravenous New Bag/Given 06/16/21 1414)     ED Course/ Medical Decision Making/ A&P                           Medical Decision Making Amount and/or Complexity of Data Reviewed Labs: ordered.   This patient presents to the ED with chief complaint(s) of fall with pertinent past medical history of chronic ataxia, recurrent falls, degenerative disease of the lumbar spine and polypharmacy which further complicates the presenting complaint. The complaint involves an extensive differential diagnosis and treatment options and also carries with it a high risk of complications and morbidity.    The differential diagnosis includes : Ataxia secondary to neuropathy, degenerative spine disease, chronic brain disease, electrolyte abnormality, polypharmacy, serotonin syndrome, NMS.  The initial plan is to order basic labs, including CK as he has had elevated CK in the past.  Additional Tests and treatment considered: Patient is low risk / negative by exam for fractures of his extremities, therefore do not feel that x-rays of the extremities are indicated. Patient negative per [place] CT C-spine rule, CT cervical spine not indicated.  Patient negative per [place] CT head rule, CT brain not indicated.   Additional history obtained: Records reviewed previous admission documents and Primary Care Documents.  Reviewed notes by PCP that occurred earlier this month and also neurology visit.  Reviewed patient's MRI brain.  Reassessment and review (also see workup area): Lab Tests: I Ordered, and personally interpreted labs.  The pertinent results include: Slightly elevated CK, CKD.  Social Determinants of Health: Patients  poor social support, memory issues   increases the complexity of managing their presentation  Complexity of problems addressed: Patients presentation is most consistent with  exacerbation of chronic illness During patient's assessment   Disposition: After consideration of the diagnostic results and the patients  response to treatment,  I feel that the patent would benefit from discharge   .   I have sent a message to patient's PCP to  ensure that he gets close follow-up.  I have also requested home health specifically targeting physical therapy to prevent future falls and to improve on his mobility.  Final Clinical Impression(s) / ED Diagnoses Final diagnoses:  Fall, initial encounter    Rx / DC Orders ED Discharge Orders     None         Varney Biles, MD 06/16/21 [phone]

## 2021-06-16 NOTE — TOC Transition Note (Addendum)
Transition of Care [facility]) - CM/SW Discharge Note   Patient Details  Name: [name]. MRN: [id number] Date of Birth: 09/16/1965  Transition of Care [facility]) CM/SW Contact:  Verdell Carmine, RN Phone Number: 06/16/2021, 4:03 PM   Clinical Narrative:       Latricia Heft will service for [place] RN and CSW, start date is Thursday.   3:1 ordered for patient, will be shipped to home from [email or web address]  Barriers to Discharge: Continued Medical Work up   Patient Goals and CMS Choice        Discharge Placement             Home with home health          Discharge Plan and Services   Discharge Planning Services: CM Consult                      HH Arranged: RN, PT, OT, Social Work [employer] Agency: [place]: 06/16/21 Time [place]: 1602 Representative spoke with at [place]: [place] ([place]) Interventions     Readmission Risk Interventions Readmission Risk Prevention Plan 01/02/2020  Transportation Screening Complete  PCP or Specialist Appt within 3-5 Days Complete  HRI or [place] Complete  Social Work Consult for [place] Planning/Counseling [place] Not Applicable  Medication Review Press photographer) Referral to Pharmacy  Some recent data might be hidden

## 2021-06-17 DIAGNOSIS — M5136 Other intervertebral disc degeneration, lumbar region: Secondary | ICD-10-CM | POA: Diagnosis not present

## 2021-06-21 DIAGNOSIS — G8928 Other chronic postprocedural pain: Secondary | ICD-10-CM | POA: Diagnosis not present

## 2021-06-21 DIAGNOSIS — Z79899 Other long term (current) drug therapy: Secondary | ICD-10-CM | POA: Diagnosis not present

## 2021-06-21 DIAGNOSIS — M25552 Pain in left hip: Secondary | ICD-10-CM | POA: Diagnosis not present

## 2021-06-21 DIAGNOSIS — M456 Ankylosing spondylitis lumbar region: Secondary | ICD-10-CM | POA: Diagnosis not present

## 2021-06-21 DIAGNOSIS — M25551 Pain in right hip: Secondary | ICD-10-CM | POA: Diagnosis not present

## 2021-06-24 ENCOUNTER — Telehealth: Payer: Self-pay | Admitting: Family Medicine

## 2021-06-24 DIAGNOSIS — M47816 Spondylosis without myelopathy or radiculopathy, lumbar region: Secondary | ICD-10-CM | POA: Diagnosis not present

## 2021-06-24 DIAGNOSIS — M545 Low back pain, unspecified: Secondary | ICD-10-CM | POA: Diagnosis not present

## 2021-06-24 NOTE — Telephone Encounter (Signed)
Verbal orders given for home health care.  ?States patient refused today. Will need verbal order for 06/25/2021.  ?Per Cherise, if he refuse 06/25/2021, will probably need home health order for 06/26/2021.  ?Advised to call office.  ?

## 2021-06-24 NOTE — Telephone Encounter (Signed)
[name] calling with [facility] called and stated that she will need a call from the office. Patient had start of care today but will need to be moved to Tomorrow 06/25/21. She will need a verbal. Please call [phone].  ?

## 2021-06-25 DIAGNOSIS — M5410 Radiculopathy, site unspecified: Secondary | ICD-10-CM | POA: Diagnosis not present

## 2021-06-25 DIAGNOSIS — M5136 Other intervertebral disc degeneration, lumbar region: Secondary | ICD-10-CM | POA: Diagnosis not present

## 2021-06-25 DIAGNOSIS — N189 Chronic kidney disease, unspecified: Secondary | ICD-10-CM | POA: Diagnosis not present

## 2021-06-25 DIAGNOSIS — R413 Other amnesia: Secondary | ICD-10-CM | POA: Diagnosis not present

## 2021-06-25 DIAGNOSIS — E1122 Type 2 diabetes mellitus with diabetic chronic kidney disease: Secondary | ICD-10-CM | POA: Diagnosis not present

## 2021-06-25 DIAGNOSIS — R270 Ataxia, unspecified: Secondary | ICD-10-CM | POA: Diagnosis not present

## 2021-06-25 DIAGNOSIS — W19XXXD Unspecified fall, subsequent encounter: Secondary | ICD-10-CM | POA: Diagnosis not present

## 2021-06-25 DIAGNOSIS — R296 Repeated falls: Secondary | ICD-10-CM | POA: Diagnosis not present

## 2021-06-25 DIAGNOSIS — E1140 Type 2 diabetes mellitus with diabetic neuropathy, unspecified: Secondary | ICD-10-CM | POA: Diagnosis not present

## 2021-06-26 ENCOUNTER — Telehealth: Payer: Self-pay | Admitting: Family Medicine

## 2021-06-26 DIAGNOSIS — R350 Frequency of micturition: Secondary | ICD-10-CM | POA: Diagnosis not present

## 2021-06-26 NOTE — Telephone Encounter (Signed)
[place] nurse states that he has scored a 15 on his depression scale. She states that she believes he is depressed. ?

## 2021-06-26 NOTE — Telephone Encounter (Signed)
Yes, he is depressed and he does have a psychiatrist and therapist and needs to follow-up with them. ?

## 2021-06-26 NOTE — Telephone Encounter (Signed)
Copied from [place] [id number]. Topic: Quick Communication - Home Health Verbal Orders >> Jun 25, 2021  4:54 PM [place] wrote: Caller/Agency: [facility] Number: [phone] Requesting OT/PT/Skilled Nursing/Social Work/Speech Therapy: PT for 1 wk 4 Caller also wanted to mention that pt is having a burning sensation when urinating.

## 2021-06-27 DIAGNOSIS — N189 Chronic kidney disease, unspecified: Secondary | ICD-10-CM | POA: Diagnosis not present

## 2021-06-27 DIAGNOSIS — Z79899 Other long term (current) drug therapy: Secondary | ICD-10-CM | POA: Diagnosis not present

## 2021-06-27 DIAGNOSIS — N1832 Chronic kidney disease, stage 3b: Secondary | ICD-10-CM | POA: Diagnosis not present

## 2021-06-30 DIAGNOSIS — E1165 Type 2 diabetes mellitus with hyperglycemia: Secondary | ICD-10-CM | POA: Diagnosis not present

## 2021-07-01 DIAGNOSIS — I129 Hypertensive chronic kidney disease with stage 1 through stage 4 chronic kidney disease, or unspecified chronic kidney disease: Secondary | ICD-10-CM | POA: Diagnosis not present

## 2021-07-01 DIAGNOSIS — E1122 Type 2 diabetes mellitus with diabetic chronic kidney disease: Secondary | ICD-10-CM | POA: Diagnosis not present

## 2021-07-01 DIAGNOSIS — N1832 Chronic kidney disease, stage 3b: Secondary | ICD-10-CM | POA: Diagnosis not present

## 2021-07-01 DIAGNOSIS — D631 Anemia in chronic kidney disease: Secondary | ICD-10-CM | POA: Diagnosis not present

## 2021-07-03 DIAGNOSIS — W19XXXD Unspecified fall, subsequent encounter: Secondary | ICD-10-CM | POA: Diagnosis not present

## 2021-07-03 DIAGNOSIS — M5136 Other intervertebral disc degeneration, lumbar region: Secondary | ICD-10-CM | POA: Diagnosis not present

## 2021-07-03 DIAGNOSIS — R296 Repeated falls: Secondary | ICD-10-CM | POA: Diagnosis not present

## 2021-07-03 DIAGNOSIS — N189 Chronic kidney disease, unspecified: Secondary | ICD-10-CM | POA: Diagnosis not present

## 2021-07-03 DIAGNOSIS — R270 Ataxia, unspecified: Secondary | ICD-10-CM | POA: Diagnosis not present

## 2021-07-03 DIAGNOSIS — E1122 Type 2 diabetes mellitus with diabetic chronic kidney disease: Secondary | ICD-10-CM | POA: Diagnosis not present

## 2021-07-03 DIAGNOSIS — R413 Other amnesia: Secondary | ICD-10-CM | POA: Diagnosis not present

## 2021-07-03 DIAGNOSIS — E1140 Type 2 diabetes mellitus with diabetic neuropathy, unspecified: Secondary | ICD-10-CM | POA: Diagnosis not present

## 2021-07-03 DIAGNOSIS — M5410 Radiculopathy, site unspecified: Secondary | ICD-10-CM | POA: Diagnosis not present

## 2021-07-04 DIAGNOSIS — M545 Low back pain, unspecified: Secondary | ICD-10-CM | POA: Diagnosis not present

## 2021-07-04 DIAGNOSIS — M461 Sacroiliitis, not elsewhere classified: Secondary | ICD-10-CM | POA: Diagnosis not present

## 2021-07-04 DIAGNOSIS — M25551 Pain in right hip: Secondary | ICD-10-CM | POA: Diagnosis not present

## 2021-07-04 DIAGNOSIS — G8929 Other chronic pain: Secondary | ICD-10-CM | POA: Diagnosis not present

## 2021-07-04 DIAGNOSIS — M25552 Pain in left hip: Secondary | ICD-10-CM | POA: Diagnosis not present

## 2021-07-04 DIAGNOSIS — Z79899 Other long term (current) drug therapy: Secondary | ICD-10-CM | POA: Diagnosis not present

## 2021-07-05 DIAGNOSIS — R270 Ataxia, unspecified: Secondary | ICD-10-CM | POA: Diagnosis not present

## 2021-07-05 DIAGNOSIS — N189 Chronic kidney disease, unspecified: Secondary | ICD-10-CM | POA: Diagnosis not present

## 2021-07-05 DIAGNOSIS — M5410 Radiculopathy, site unspecified: Secondary | ICD-10-CM | POA: Diagnosis not present

## 2021-07-05 DIAGNOSIS — R413 Other amnesia: Secondary | ICD-10-CM | POA: Diagnosis not present

## 2021-07-05 DIAGNOSIS — W19XXXD Unspecified fall, subsequent encounter: Secondary | ICD-10-CM | POA: Diagnosis not present

## 2021-07-05 DIAGNOSIS — M5136 Other intervertebral disc degeneration, lumbar region: Secondary | ICD-10-CM | POA: Diagnosis not present

## 2021-07-05 DIAGNOSIS — E1140 Type 2 diabetes mellitus with diabetic neuropathy, unspecified: Secondary | ICD-10-CM | POA: Diagnosis not present

## 2021-07-05 DIAGNOSIS — R296 Repeated falls: Secondary | ICD-10-CM | POA: Diagnosis not present

## 2021-07-05 DIAGNOSIS — E1122 Type 2 diabetes mellitus with diabetic chronic kidney disease: Secondary | ICD-10-CM | POA: Diagnosis not present

## 2021-07-07 ENCOUNTER — Other Ambulatory Visit: Payer: Self-pay | Admitting: Family Medicine

## 2021-07-07 DIAGNOSIS — Z794 Long term (current) use of insulin: Secondary | ICD-10-CM

## 2021-07-07 DIAGNOSIS — E1122 Type 2 diabetes mellitus with diabetic chronic kidney disease: Secondary | ICD-10-CM

## 2021-07-07 DIAGNOSIS — J302 Other seasonal allergic rhinitis: Secondary | ICD-10-CM

## 2021-07-07 DIAGNOSIS — M5136 Other intervertebral disc degeneration, lumbar region: Secondary | ICD-10-CM

## 2021-07-08 DIAGNOSIS — R296 Repeated falls: Secondary | ICD-10-CM | POA: Diagnosis not present

## 2021-07-08 DIAGNOSIS — R270 Ataxia, unspecified: Secondary | ICD-10-CM | POA: Diagnosis not present

## 2021-07-08 DIAGNOSIS — M5410 Radiculopathy, site unspecified: Secondary | ICD-10-CM | POA: Diagnosis not present

## 2021-07-08 DIAGNOSIS — E1140 Type 2 diabetes mellitus with diabetic neuropathy, unspecified: Secondary | ICD-10-CM | POA: Diagnosis not present

## 2021-07-08 DIAGNOSIS — R413 Other amnesia: Secondary | ICD-10-CM | POA: Diagnosis not present

## 2021-07-08 DIAGNOSIS — N189 Chronic kidney disease, unspecified: Secondary | ICD-10-CM | POA: Diagnosis not present

## 2021-07-08 DIAGNOSIS — W19XXXD Unspecified fall, subsequent encounter: Secondary | ICD-10-CM | POA: Diagnosis not present

## 2021-07-08 DIAGNOSIS — M5136 Other intervertebral disc degeneration, lumbar region: Secondary | ICD-10-CM | POA: Diagnosis not present

## 2021-07-08 DIAGNOSIS — E1122 Type 2 diabetes mellitus with diabetic chronic kidney disease: Secondary | ICD-10-CM | POA: Diagnosis not present

## 2021-07-09 ENCOUNTER — Ambulatory Visit (INDEPENDENT_AMBULATORY_CARE_PROVIDER_SITE_OTHER): Payer: Medicare Other | Admitting: Endocrinology

## 2021-07-09 ENCOUNTER — Other Ambulatory Visit: Payer: Self-pay

## 2021-07-09 ENCOUNTER — Encounter: Payer: Self-pay | Admitting: Endocrinology

## 2021-07-09 ENCOUNTER — Telehealth: Payer: Self-pay | Admitting: Diagnostic Neuroimaging

## 2021-07-09 VITALS — BP 110/74 | HR 90 | Ht 75.0 in | Wt 217.8 lb

## 2021-07-09 DIAGNOSIS — E1122 Type 2 diabetes mellitus with diabetic chronic kidney disease: Secondary | ICD-10-CM | POA: Diagnosis not present

## 2021-07-09 DIAGNOSIS — Z794 Long term (current) use of insulin: Secondary | ICD-10-CM | POA: Diagnosis not present

## 2021-07-09 DIAGNOSIS — N184 Chronic kidney disease, stage 4 (severe): Secondary | ICD-10-CM

## 2021-07-09 NOTE — Telephone Encounter (Signed)
Requested medication (s) are due for refill today: yes ? ?Requested medication (s) are on the active medication list: yes ? ?Last refill:  Zanaflex 06/06/21 #90 with 0 RF, Chantex 06/06/21 #56 with 0 RF, Flonase 06/20/2020 4g with 3 RF ? ?Future visit scheduled: 11/19/21 ? ?Notes to clinic:  Zanaflex and Flonase not delegated, Chantex creatinine lab is elevated, pt has been elevated but it is getting higher each lab test, please assess. ? ? ?  ? ?Requested Prescriptions  ?Pending Prescriptions Disp Refills  ? tiZANidine (ZANAFLEX) 4 MG tablet [Pharmacy Med Name: tiZANidine HCl 4 MG Oral Tablet] 90 tablet 0  ?  Sig: TAKE 1 TABLET BY MOUTH EVERY 8  HOURS AS NEEDED FOR MUSCLE  SPASM(S)  ?  ? Not Delegated - Cardiovascular:  Alpha-2 Agonists - tizanidine Failed - 07/07/2021  3:39 PM  ?  ?  Failed - This refill cannot be delegated  ?  ?  Passed - Valid encounter within last 6 months  ?  Recent Outpatient Visits   ? ?      ? 1 month ago Type 2 diabetes mellitus with stage 4 chronic kidney disease, with long-term current use of insulin ([place])  ? [place], Charlane Ferretti, MD  ? 5 months ago Type 2 diabetes mellitus with stage 4 chronic kidney disease, with long-term current use of insulin ([place])  ? [place], Dionne Bucy, [place]  ? 7 months ago Encounter for [employer] annual wellness exam  ? [place], Enobong, MD  ? 1 year ago Hypertriglyceridemia  ? [place] [facility] [place], Charlane Ferretti, MD  ? 1 year ago Type 2 diabetes mellitus with stage 3a chronic kidney disease, with long-term current use of insulin ([place])  ? [place] Ladell Pier, MD  ? ?  ?  ?Future Appointments   ? ?        ? In 4 months [name], MD [place]  ? ?  ? ?  ?  ?  ? varenicline (CHANTIX) 1 MG tablet [Pharmacy Med Name: VARENICLINE  '1MG'$   TAB] 56 tablet 0  ?  Sig:  TAKE 1 TABLET BY MOUTH TWICE  DAILY  ?  ? Psychiatry:  Drug Dependence Therapy - varenicline Failed - 07/07/2021  3:39 PM  ?  ?  Failed - Manual Review: Do not refill starter pack. 1 mg tabs may be extended up to one year if the patient has quit smoking but still feels at risk for relapse.  ?  ?  Failed - Cr in normal range and within 180 days  ?  Creatinine, Ser  ?Date Value Ref Range Status  ?06/16/2021 2.47 (H) 0.61 - 1.24 mg/dL Final  ? ?Creatinine,U  ?Date Value Ref Range Status  ?04/18/2015 250.2 mg/dL Final  ? ?Creatinine, Urine  ?Date Value Ref Range Status  ?10/03/2015 35.87 mg/dL Final  ?  ?  ?  ?  Passed - Completed PHQ-2 or PHQ-9 in the last 360 days  ?  ?  Passed - Valid encounter within last 6 months  ?  Recent Outpatient Visits   ? ?      ? 1 month ago Type 2 diabetes mellitus with stage 4 chronic kidney disease, with long-term current use of insulin ([place])  ? [place], MD  ? 5 months ago  Type 2 diabetes mellitus with stage 4 chronic kidney disease, with long-term current use of insulin ([place])  ? [place], Dionne Bucy, [place]  ? 7 months ago Encounter for [employer] annual wellness exam  ? [place], Enobong, MD  ? 1 year ago Hypertriglyceridemia  ? [place] [facility] [place], Charlane Ferretti, MD  ? 1 year ago Type 2 diabetes mellitus with stage 3a chronic kidney disease, with long-term current use of insulin ([place])  ? [place] Ladell Pier, MD  ? ?  ?  ?Future Appointments   ? ?        ? In 4 months [name], MD [place]  ? ?  ? ?  ?  ?  ? fluticasone (FLONASE) 50 MCG/ACT nasal spray [Pharmacy Med Name: Fluticasone Propionate 50 MCG/ACT Nasal Suspension] 32 g   ?  Sig: USE 1 SPRAY IN BOTH  NOSTRILS DAILY  ?  ? Not Delegated - Ear, Nose, and Throat: Nasal Preparations - Corticosteroids Failed -  07/07/2021  3:39 PM  ?  ?  Failed - This refill cannot be delegated  ?  ?  Passed - Valid encounter within last 12 months  ?  Recent Outpatient Visits   ? ?      ? 1 month ago Type 2 diabetes mellitus with stage 4 chronic kidney disease, with long-term current use of insulin ([place])  ? [place], Charlane Ferretti, MD  ? 5 months ago Type 2 diabetes mellitus with stage 4 chronic kidney disease, with long-term current use of insulin ([place])  ? [place], Dionne Bucy, [place]  ? 7 months ago Encounter for [employer] annual wellness exam  ? [place], Enobong, MD  ? 1 year ago Hypertriglyceridemia  ? [place] [facility] [place], Charlane Ferretti, MD  ? 1 year ago Type 2 diabetes mellitus with stage 3a chronic kidney disease, with long-term current use of insulin ([place])  ? [place] Ladell Pier, MD  ? ?  ?  ?Future Appointments   ? ?        ? In 4 months [name], MD [place]  ? ?  ? ?  ?  ?  ?Signed Prescriptions Disp Refills  ? Insulin Pen Needle (B-D UF III MINI PEN NEEDLES) 31G X 5 MM MISC 120 each 12  ?  Sig: USE 4 TIMES DAILY  ?  ? Endocrinology: Diabetes - Testing Supplies Passed - 07/07/2021  3:39 PM  ?  ?  Passed - Valid encounter within last 12 months  ?  Recent Outpatient Visits   ? ?      ? 1 month ago Type 2 diabetes mellitus with stage 4 chronic kidney disease, with long-term current use of insulin ([place])  ? [place], Charlane Ferretti, MD  ? 5 months ago Type 2 diabetes mellitus with stage 4 chronic kidney disease, with long-term current use of insulin ([place])  ? [place], Dionne Bucy, [place]  ? 7 months ago Encounter for [employer] annual wellness exam  ? [place], Enobong, MD  ? 1 year ago Hypertriglyceridemia  ? [place] [name], MD  ?  1 year ago Type 2 diabetes mellitus with stage 3a chronic kidney disease, with long-term current use of insulin ([place])  ? [place] Ladell Pier, MD  ? ?  ?  ?Future Appointments   ? ?        ? In 4 months [name], MD [place]  ? ?  ? ?  ?  ?  ? ? ?

## 2021-07-09 NOTE — Patient Instructions (Addendum)
Please continue the same insulin for now.   ?please call us if your blood sugar goes below 70, or if your blood sugar is mostly over 200.    ?Please come back for a follow-up appointment in 1 month.   ?

## 2021-07-09 NOTE — Telephone Encounter (Signed)
Pt left a vm at 4:20 on yesterday asking for a call with results to his MRI and X-Ray that was ordered by Dr Leta Baptist, please call. ?

## 2021-07-09 NOTE — Telephone Encounter (Signed)
Requested Prescriptions  ?Pending Prescriptions Disp Refills  ?? tiZANidine (ZANAFLEX) 4 MG tablet [Pharmacy Med Name: tiZANidine HCl 4 MG Oral Tablet] 90 tablet 0  ?  Sig: TAKE 1 TABLET BY MOUTH EVERY 8  HOURS AS NEEDED FOR MUSCLE  SPASM(S)  ?  ? Not Delegated - Cardiovascular:  Alpha-2 Agonists - tizanidine Failed - 07/07/2021  3:39 PM  ?  ?  Failed - This refill cannot be delegated  ?  ?  Passed - Valid encounter within last 6 months  ?  Recent Outpatient Visits   ?      ? 1 month ago Type 2 diabetes mellitus with stage 4 chronic kidney disease, with long-term current use of insulin ([place])  ? [place], Charlane Ferretti, MD  ? 5 months ago Type 2 diabetes mellitus with stage 4 chronic kidney disease, with long-term current use of insulin ([place])  ? [place], Dionne Bucy, [place]  ? 7 months ago Encounter for [employer] annual wellness exam  ? Jal, Enobong, MD  ? 1 year ago Hypertriglyceridemia  ? [place] [facility] [place], Charlane Ferretti, MD  ? 1 year ago Type 2 diabetes mellitus with stage 3a chronic kidney disease, with long-term current use of insulin ([place])  ? [place] Ladell Pier, MD  ?  ?  ?Future Appointments   ?        ? In 4 months [name], MD [place]  ?  ? ?  ?  ?  ?? varenicline (CHANTIX) 1 MG tablet [Pharmacy Med Name: VARENICLINE  '1MG'$   TAB] 56 tablet 0  ?  Sig: TAKE 1 TABLET BY MOUTH TWICE  DAILY  ?  ? Psychiatry:  Drug Dependence Therapy - varenicline Failed - 07/07/2021  3:39 PM  ?  ?  Failed - Manual Review: Do not refill starter pack. 1 mg tabs may be extended up to one year if the patient has quit smoking but still feels at risk for relapse.  ?  ?  Failed - Cr in normal range and within 180 days  ?  Creatinine, Ser  ?Date Value Ref Range Status  ?06/16/2021 2.47 (H) 0.61 - 1.24 mg/dL Final   ? ?Creatinine,U  ?Date Value Ref Range Status  ?04/18/2015 250.2 mg/dL Final  ? ?Creatinine, Urine  ?Date Value Ref Range Status  ?10/03/2015 35.87 mg/dL Final  ?   ?  ?  Passed - Completed PHQ-2 or PHQ-9 in the last 360 days  ?  ?  Passed - Valid encounter within last 6 months  ?  Recent Outpatient Visits   ?      ? 1 month ago Type 2 diabetes mellitus with stage 4 chronic kidney disease, with long-term current use of insulin ([place])  ? [place], Charlane Ferretti, MD  ? 5 months ago Type 2 diabetes mellitus with stage 4 chronic kidney disease, with long-term current use of insulin ([place])  ? [place], Dionne Bucy, [place]  ? 7 months ago Encounter for [employer] annual wellness exam  ? [place], Enobong, MD  ? 1 year ago Hypertriglyceridemia  ? [facility] [place], Charlane Ferretti, MD  ? 1 year ago Type 2 diabetes mellitus with stage 3a chronic kidney disease, with long-term current use  of insulin ([place])  ? [place] Ladell Pier, MD  ?  ?  ?Future Appointments   ?        ? In 4 months [name], MD [place]  ?  ? ?  ?  ?  ?? Insulin Pen Needle (B-D UF III MINI PEN NEEDLES) 31G X 5 MM MISC [Pharmacy Med Name: [id number]] 597 each 12  ?  Sig: USE 4 TIMES DAILY  ?  ? Endocrinology: Diabetes - Testing Supplies Passed - 07/07/2021  3:39 PM  ?  ?  Passed - Valid encounter within last 12 months  ?  Recent Outpatient Visits   ?      ? 1 month ago Type 2 diabetes mellitus with stage 4 chronic kidney disease, with long-term current use of insulin ([place])  ? [place], Charlane Ferretti, MD  ? 5 months ago Type 2 diabetes mellitus with stage 4 chronic kidney disease, with long-term current use of insulin ([place])  ? [place], Dionne Bucy, [place]  ? 7 months  ago Encounter for [employer] annual wellness exam  ? [place], Enobong, MD  ? 1 year ago Hypertriglyceridemia  ? [place] [facility] [place], Charlane Ferretti, MD  ? 1 year ago Type 2 diabetes mellitus with stage 3a chronic kidney disease, with long-term current use of insulin ([place])  ? [place] Ladell Pier, MD  ?  ?  ?Future Appointments   ?        ? In 4 months [name], MD [place]  ?  ? ?  ?  ?  ?? fluticasone (FLONASE) 50 MCG/ACT nasal spray [Pharmacy Med Name: Fluticasone Propionate 50 MCG/ACT Nasal Suspension] 32 g   ?  Sig: USE 1 SPRAY IN BOTH  NOSTRILS DAILY  ?  ? Not Delegated - Ear, Nose, and Throat: Nasal Preparations - Corticosteroids Failed - 07/07/2021  3:39 PM  ?  ?  Failed - This refill cannot be delegated  ?  ?  Passed - Valid encounter within last 12 months  ?  Recent Outpatient Visits   ?      ? 1 month ago Type 2 diabetes mellitus with stage 4 chronic kidney disease, with long-term current use of insulin ([place])  ? [place], Charlane Ferretti, MD  ? 5 months ago Type 2 diabetes mellitus with stage 4 chronic kidney disease, with long-term current use of insulin ([place])  ? [place], Dionne Bucy, [place]  ? 7 months ago Encounter for [employer] annual wellness exam  ? [place], Enobong, MD  ? 1 year ago Hypertriglyceridemia  ? [place] [facility] [place], Charlane Ferretti, MD  ? 1 year ago Type 2 diabetes mellitus with stage 3a chronic kidney disease, with long-term current use of insulin ([place])  ? [place] Ladell Pier, MD  ?  ?  ?Future Appointments   ?        ? In 4 months [name], MD [place]  ?  ? ?  ?  ?  ? ? ?

## 2021-07-09 NOTE — Progress Notes (Signed)
? ?Subjective:  ? ? Patient ID: [name]., male    DOB: [birth year], 56 y.o.   MRN: [id number] ? ?HPI ?Pt returns for f/u of diabetes mellitus:  ?DM type: Insulin-requiring type 2. ?Dx'ed: 07-07-10 ?Complications: PN and stage 4 CRI ?Therapy: insulin since 07-Jul-2015 ?DKA: never ?Severe hypoglycemia: never ?Pancreatitis: never ?Pancreatic imaging: normal on 07/06/13 CT ?SDOH: Sister [name]) provides hx; rx has been limited by poor memory and frequent falls; Aide visits 10AM-2PM (Mon-Sat).  Pt gives his own insulin. Pt and sister say he sometimes misses the insulin; he eats meals at 10AM, 3PM, and 7PM.  However, pt says he usually eats throughout the day.  ?Other: none ?Interval history: Pt says he takes insulin as rx'ed.  I reviewed continuous glucose monitor data.  Glucose varies from 65-240.  It is in general highest at 11AM-2PM, and lowest at 8AM.  It decreases overnight.  There is a gap in data in the evening.  He has mild hypoglycemia approx twice per week.  This usually happens at approx 2AM-5AM.   ?Past Medical History:  ?Diagnosis Date  ? ADD (attention deficit disorder)   ? Anxiety   ? Arthritis   ? right hip  ? Bipolar 1 disorder ([place])   ? Blood in urine   ? CKD (chronic kidney disease), stage III ([place])   ? Creatinine elevation   ? Dementia ([employer])   ? "early onset" (08/04/2017)  ? Depression   ? bipolar [facility]  ? Diabetes mellitus without complication ([place])   ? Family history of anesthesia complication   ? pt is unsure , but pt father may have been difficult to arouse   ? HCAP (healthcare-associated pneumonia) 10/31/2012  ? History of kidney stones   ? Hypertension   ? Hypogonadism male   ? Liver fatty degeneration   ? Microscopic hematuria   ? hereditary s/p Urology eval  ? Neuromuscular disorder ([place])   ? feet neuropathy   ? Osteoarthritis of right hip 11/28/2011  ? 2012 2015 s/p THR Severe  Dr Novella Olive    ? Pleural effusion 11/02/2012  ? Pneumonia 10-2012  ? Pneumonia, organism unspecified(486)  11/02/2012  ? Polysubstance dependence, non-opioid, in remission ([place])   ? remote  ? Primary osteoarthritis of left hip 05/22/2015  ? PTSD (post-traumatic stress disorder)   ? SOCIAL ANXIETY DISORDER   ? Schizoaffective disorder ([place])   ? Substance abuse ([place])   ? Suicide attempt by multiple drug overdose ([place]) 12/29/15  ? Grieving his cat's death 07/07/15  ? ? ?Past Surgical History:  ?Procedure Laterality Date  ? BACK SURGERY    ? CLOSED REDUCTION METACARPAL WITH PERCUTANEOUS PINNING Right   ? LUMBAR DISC SURGERY    ? TONSILLECTOMY    ? TOTAL HIP ARTHROPLASTY Right 08/16/2013  ? Procedure: TOTAL HIP ARTHROPLASTY ANTERIOR APPROACH;  Surgeon: Hessie Dibble, MD;  Location: [place];  Service: Orthopedics;  Laterality: Right;  ? TOTAL HIP ARTHROPLASTY Left 05/22/2015  ? Procedure: TOTAL HIP ARTHROPLASTY ANTERIOR APPROACH;  Surgeon: Melrose Nakayama, MD;  Location: [place];  Service: Orthopedics;  Laterality: Left;  ? ? ?Social History  ? ?Socioeconomic History  ? Marital status: Single  ?  Spouse name: Not on file  ? Number of children: 0  ? Years of education: Not on file  ? Highest education level: Some college, no degree  ?Occupational History  ? Occupation: disability  ?Tobacco Use  ? Smoking status: Former  ?  Years: 0.25  ?  Types: Cigarettes  ?  Quit date: 03/22/2019  ?  Years since quitting: 2.3  ? Smokeless tobacco: Never  ?Vaping Use  ? Vaping Use: Never used  ?Substance and Sexual Activity  ? Alcohol use: Not Currently  ? Drug use: No  ?  Comment: hx of marijuana/cocaine/crack use but sober since 20's  ? Sexual activity: Not Currently  ?Other Topics Concern  ? Not on file  ?Social History Narrative  ? 05/08/2021 lives alone, sister [name] helps with meds, he has some in home care, lived with sister until Nov 2020  ? Caffeine- sodas, amount  varies  ? regular exercise-no  ? ?Social Determinants of Health  ? ?Financial Resource Strain: Not on file  ?Food Insecurity: Not on file  ?Transportation Needs: Not on file  ?Physical  Activity: Not on file  ?Stress: Not on file  ?Social Connections: Not on file  ?Intimate Partner Violence: Not on file  ? ? ?Current Outpatient Medications on File Prior to Visit  ?Medication Sig Dispense Refill  ? alfuzosin (UROXATRAL) 10 MG 24 hr tablet Take 10 mg by mouth at bedtime.     ? ARIPiprazole (ABILIFY) 5 MG tablet TAKE 1 TABLET BY MOUTH DAILY FOR DEPRESSION (Patient taking differently: Take 5 mg by mouth daily.) 30 tablet 0  ? atorvastatin (LIPITOR) 10 MG tablet TAKE 1 TABLET BY MOUTH AT  BEDTIME 90 tablet 3  ? buPROPion (WELLBUTRIN XL) 300 MG 24 hr tablet Take 1 tablet (300 mg total) by mouth daily. 30 tablet 0  ? busPIRone (BUSPAR) 10 MG tablet Take 10 mg by mouth 2 (two) times daily.    ? busPIRone (BUSPAR) 15 MG tablet Take 15 mg by mouth 2 (two) times daily.    ? chlorhexidine (PERIDEX) 0.12 % solution SMARTSIG:By Mouth    ? ciclopirox (PENLAC) 8 % solution APPLY DAILY TO NAILS. ONCE  A WEEK FILE TOENAIL AND  CLEAN WITH ALCOHOL AND  REAPPLY 13.2 mL 0  ? clonazePAM (KLONOPIN) 0.5 MG tablet Take 0.5 mg by mouth 2 (two) times daily as needed.    ? Continuous Blood Gluc Receiver (FREESTYLE LIBRE READER) DEVI Use as directed 1 each each  ? Continuous Blood Gluc Sensor (FREESTYLE LIBRE 14 DAY SENSOR) MISC Use as directed 2 each 4  ? Continuous Blood Gluc Sensor (FREESTYLE LIBRE 2 SENSOR) MISC by Does not apply route.    ? divalproex (DEPAKOTE ER) 500 MG 24 hr tablet Take 500 mg by mouth daily.    ? fenofibrate (TRICOR) 145 MG tablet TAKE 1 TABLET BY MOUTH DAILY 90 tablet 3  ? FLUARIX QUADRIVALENT 0.5 ML injection     ? gabapentin (NEURONTIN) 300 MG capsule TAKE 1 CAPSULE BY MOUTH TWICE  DAILY 180 capsule 2  ? glucose blood (ACCU-CHEK GUIDE) test strip CHECK SUGAR FOUR TIMES DAILY 100 each 12  ? Insulin NPH, Human,, Isophane, (HUMULIN N KWIKPEN) 100 UNIT/ML Kiwkpen Inject 45 Units into the skin every morning. And pen needles 1/day 45 mL 3  ? Insulin Pen Needle (B-D UF III MINI PEN NEEDLES) 31G X 5 MM MISC  USE 4 TIMES DAILY 120 each 12  ? lisinopril (ZESTRIL) 5 MG tablet TAKE 1 TABLET BY MOUTH  DAILY 90 tablet 3  ? mupirocin ointment (BACTROBAN) 2 % Apply 1 application topically 2 (two) times daily. 30 g 2  ? nicotine (NICODERM CQ) 14 mg/24hr patch Place 1 patch (14 mg total) onto the skin daily. Then 7 mg/24 hr patch for 1 month 28  patch 1  ? oxyCODONE-acetaminophen (PERCOCET/ROXICET) 5-325 MG tablet Take by mouth.    ? pantoprazole (PROTONIX) 40 MG tablet TAKE 1 TABLET BY MOUTH  DAILY 90 tablet 3  ? polyethylene glycol (MIRALAX / GLYCOLAX) 17 g packet Take 17 g by mouth daily.    ? QUEtiapine (SEROQUEL) 400 MG tablet Take 400 mg by mouth as needed.    ? tadalafil (CIALIS) 5 MG tablet TAKE ONE TABLET BY MOUTH EVERY DAY FOR BPH 90 tablet 1  ? Testosterone 20.25 MG/ACT (1.62%) GEL Apply topically once daily to the upper arms 75 g 5  ? traMADol (ULTRAM) 50 MG tablet Take 1 tablet (50 mg total) by mouth every 12 (twelve) hours as needed. 60 tablet 1  ? traZODone (DESYREL) 150 MG tablet Take 1 tablet (150 mg total) by mouth at bedtime. 90 tablet 1  ? fluticasone (FLONASE) 50 MCG/ACT nasal spray USE 1 SPRAY IN BOTH  NOSTRILS DAILY 32 g 1  ? tiZANidine (ZANAFLEX) 4 MG tablet TAKE 1 TABLET BY MOUTH EVERY 8  HOURS AS NEEDED FOR MUSCLE  SPASM(S) 90 tablet 1  ? varenicline (CHANTIX) 1 MG tablet TAKE 1 TABLET BY MOUTH TWICE  DAILY 56 tablet 0  ? ?No current facility-administered medications on file prior to visit.  ? ? ?Allergies  ?Allergen Reactions  ? Vicodin [Hydrocodone-Acetaminophen] Itching  ? ? ?Family History  ?Problem Relation Age of Onset  ? Diabetes Father   ? Cancer Mother   ?     died of melanoma with mets  ? Cervical cancer Sister   ? Diabetes Sister   ? Other Neg Hx   ?     hypogonadism  ? Colon cancer Neg Hx   ? Colon polyps Neg Hx   ? Esophageal cancer Neg Hx   ? Rectal cancer Neg Hx   ? Stomach cancer Neg Hx   ? ? ?BP 110/74 (BP Location: Left Arm, Patient Position: Sitting, Cuff Size: Normal)   Pulse 90   Ht  '6\' 3"'$  (1.905 m)   Wt 217 lb 12.8 oz (98.8 kg)   SpO2 97%   BMI 27.22 kg/m?  ? ? ? ?Review of Systems ? ?   ?Objective:  ? Physical Exam ? ? ?Lab Results  ?Component Value Date  ? HGBA1C 7.8 (A) 06/11/18

## 2021-07-11 DIAGNOSIS — E1122 Type 2 diabetes mellitus with diabetic chronic kidney disease: Secondary | ICD-10-CM | POA: Diagnosis not present

## 2021-07-11 DIAGNOSIS — R270 Ataxia, unspecified: Secondary | ICD-10-CM | POA: Diagnosis not present

## 2021-07-11 DIAGNOSIS — R413 Other amnesia: Secondary | ICD-10-CM | POA: Diagnosis not present

## 2021-07-11 DIAGNOSIS — W19XXXD Unspecified fall, subsequent encounter: Secondary | ICD-10-CM | POA: Diagnosis not present

## 2021-07-11 DIAGNOSIS — N189 Chronic kidney disease, unspecified: Secondary | ICD-10-CM | POA: Diagnosis not present

## 2021-07-11 DIAGNOSIS — M5136 Other intervertebral disc degeneration, lumbar region: Secondary | ICD-10-CM | POA: Diagnosis not present

## 2021-07-11 DIAGNOSIS — E1140 Type 2 diabetes mellitus with diabetic neuropathy, unspecified: Secondary | ICD-10-CM | POA: Diagnosis not present

## 2021-07-11 DIAGNOSIS — M5410 Radiculopathy, site unspecified: Secondary | ICD-10-CM | POA: Diagnosis not present

## 2021-07-11 DIAGNOSIS — R296 Repeated falls: Secondary | ICD-10-CM | POA: Diagnosis not present

## 2021-07-12 DIAGNOSIS — N189 Chronic kidney disease, unspecified: Secondary | ICD-10-CM | POA: Diagnosis not present

## 2021-07-12 DIAGNOSIS — M5410 Radiculopathy, site unspecified: Secondary | ICD-10-CM | POA: Diagnosis not present

## 2021-07-12 DIAGNOSIS — R270 Ataxia, unspecified: Secondary | ICD-10-CM | POA: Diagnosis not present

## 2021-07-12 DIAGNOSIS — W19XXXD Unspecified fall, subsequent encounter: Secondary | ICD-10-CM | POA: Diagnosis not present

## 2021-07-12 DIAGNOSIS — E1122 Type 2 diabetes mellitus with diabetic chronic kidney disease: Secondary | ICD-10-CM | POA: Diagnosis not present

## 2021-07-12 DIAGNOSIS — E1140 Type 2 diabetes mellitus with diabetic neuropathy, unspecified: Secondary | ICD-10-CM | POA: Diagnosis not present

## 2021-07-12 DIAGNOSIS — M5136 Other intervertebral disc degeneration, lumbar region: Secondary | ICD-10-CM | POA: Diagnosis not present

## 2021-07-12 DIAGNOSIS — R413 Other amnesia: Secondary | ICD-10-CM | POA: Diagnosis not present

## 2021-07-12 DIAGNOSIS — R296 Repeated falls: Secondary | ICD-10-CM | POA: Diagnosis not present

## 2021-07-17 DIAGNOSIS — N189 Chronic kidney disease, unspecified: Secondary | ICD-10-CM | POA: Diagnosis not present

## 2021-07-17 DIAGNOSIS — E1122 Type 2 diabetes mellitus with diabetic chronic kidney disease: Secondary | ICD-10-CM | POA: Diagnosis not present

## 2021-07-17 DIAGNOSIS — R296 Repeated falls: Secondary | ICD-10-CM | POA: Diagnosis not present

## 2021-07-17 DIAGNOSIS — R413 Other amnesia: Secondary | ICD-10-CM | POA: Diagnosis not present

## 2021-07-17 DIAGNOSIS — R270 Ataxia, unspecified: Secondary | ICD-10-CM | POA: Diagnosis not present

## 2021-07-17 DIAGNOSIS — M5136 Other intervertebral disc degeneration, lumbar region: Secondary | ICD-10-CM | POA: Diagnosis not present

## 2021-07-17 DIAGNOSIS — E1140 Type 2 diabetes mellitus with diabetic neuropathy, unspecified: Secondary | ICD-10-CM | POA: Diagnosis not present

## 2021-07-17 DIAGNOSIS — M5410 Radiculopathy, site unspecified: Secondary | ICD-10-CM | POA: Diagnosis not present

## 2021-07-17 DIAGNOSIS — W19XXXD Unspecified fall, subsequent encounter: Secondary | ICD-10-CM | POA: Diagnosis not present

## 2021-07-18 DIAGNOSIS — E1122 Type 2 diabetes mellitus with diabetic chronic kidney disease: Secondary | ICD-10-CM | POA: Diagnosis not present

## 2021-07-18 DIAGNOSIS — N189 Chronic kidney disease, unspecified: Secondary | ICD-10-CM | POA: Diagnosis not present

## 2021-07-18 DIAGNOSIS — R296 Repeated falls: Secondary | ICD-10-CM | POA: Diagnosis not present

## 2021-07-18 DIAGNOSIS — R413 Other amnesia: Secondary | ICD-10-CM | POA: Diagnosis not present

## 2021-07-18 DIAGNOSIS — E1140 Type 2 diabetes mellitus with diabetic neuropathy, unspecified: Secondary | ICD-10-CM | POA: Diagnosis not present

## 2021-07-18 DIAGNOSIS — R270 Ataxia, unspecified: Secondary | ICD-10-CM | POA: Diagnosis not present

## 2021-07-18 DIAGNOSIS — M5136 Other intervertebral disc degeneration, lumbar region: Secondary | ICD-10-CM | POA: Diagnosis not present

## 2021-07-18 DIAGNOSIS — W19XXXD Unspecified fall, subsequent encounter: Secondary | ICD-10-CM | POA: Diagnosis not present

## 2021-07-18 DIAGNOSIS — M5410 Radiculopathy, site unspecified: Secondary | ICD-10-CM | POA: Diagnosis not present

## 2021-07-18 DIAGNOSIS — M47816 Spondylosis without myelopathy or radiculopathy, lumbar region: Secondary | ICD-10-CM | POA: Diagnosis not present

## 2021-07-19 ENCOUNTER — Ambulatory Visit (INDEPENDENT_AMBULATORY_CARE_PROVIDER_SITE_OTHER): Payer: Medicare Other | Admitting: Podiatry

## 2021-07-19 ENCOUNTER — Other Ambulatory Visit: Payer: Self-pay | Admitting: Family Medicine

## 2021-07-19 ENCOUNTER — Ambulatory Visit: Payer: Self-pay

## 2021-07-19 DIAGNOSIS — B351 Tinea unguium: Secondary | ICD-10-CM | POA: Diagnosis not present

## 2021-07-19 DIAGNOSIS — R270 Ataxia, unspecified: Secondary | ICD-10-CM | POA: Diagnosis not present

## 2021-07-19 DIAGNOSIS — M79674 Pain in right toe(s): Secondary | ICD-10-CM

## 2021-07-19 DIAGNOSIS — E1140 Type 2 diabetes mellitus with diabetic neuropathy, unspecified: Secondary | ICD-10-CM | POA: Diagnosis not present

## 2021-07-19 DIAGNOSIS — N189 Chronic kidney disease, unspecified: Secondary | ICD-10-CM | POA: Diagnosis not present

## 2021-07-19 DIAGNOSIS — M5136 Other intervertebral disc degeneration, lumbar region: Secondary | ICD-10-CM | POA: Diagnosis not present

## 2021-07-19 DIAGNOSIS — M5410 Radiculopathy, site unspecified: Secondary | ICD-10-CM | POA: Diagnosis not present

## 2021-07-19 DIAGNOSIS — R296 Repeated falls: Secondary | ICD-10-CM | POA: Diagnosis not present

## 2021-07-19 DIAGNOSIS — M79675 Pain in left toe(s): Secondary | ICD-10-CM

## 2021-07-19 DIAGNOSIS — L84 Corns and callosities: Secondary | ICD-10-CM

## 2021-07-19 DIAGNOSIS — W19XXXD Unspecified fall, subsequent encounter: Secondary | ICD-10-CM | POA: Diagnosis not present

## 2021-07-19 DIAGNOSIS — E1122 Type 2 diabetes mellitus with diabetic chronic kidney disease: Secondary | ICD-10-CM | POA: Diagnosis not present

## 2021-07-19 DIAGNOSIS — E1142 Type 2 diabetes mellitus with diabetic polyneuropathy: Secondary | ICD-10-CM

## 2021-07-19 DIAGNOSIS — R413 Other amnesia: Secondary | ICD-10-CM | POA: Diagnosis not present

## 2021-07-19 NOTE — Telephone Encounter (Signed)
He has been placed on my schedule for Monday ?

## 2021-07-19 NOTE — Telephone Encounter (Signed)
?  Chief Complaint: dizziness  ?Symptoms: dizziness and facial/body numbness when standing ?Frequency: ongoing ?Pertinent Negatives: NA ?Disposition: '[]'$ ED /'[]'$ Urgent Care (no appt availability in office) / '[]'$ Appointment(In office/virtual)/ '[]'$  [place] [facility]/ '[]'$ Home Care/ '[]'$ Refused Recommended Disposition /'[]'$ [place] [facility]/ '[x]'$  Follow-up with PCP ?Additional Notes: Pt states that Dr. Margarita Rana is aware of his symptoms but his dizziness and facial numbness are taking a lot longer to go away at this point. He states that he has fell since ED visit on 06/16/21 and is just concerned. I advised him that Dr Margarita Rana was fully booked and attempted to schedule with Army Melia, NP but pt prefers only seeing Dr Margarita Rana since she knows his hx. I advised him that I will send her a message and see if he could be worked in to be seen. Pt states VV would be ok as well. Pt has several appts today but was available on Monday 07/22/21.  ? ? ?Reason for Disposition ? [1] Numbness (i.e., loss of sensation) of the face, arm / hand, or leg / foot on one side of the body AND [2] gradual onset (e.g., days to weeks) AND [3] present now ? ?Answer Assessment - Initial Assessment Questions ?1. SYMPTOM: "What is the main symptom you are concerned about?" (e.g., weakness, numbness) ?    Facial numbness and dizziness ?2. ONSET: "When did this start?" (minutes, hours, days; while sleeping) ?    Ongoing but gotten worse  ?4. PATTERN "Does this come and go, or has it been constant since it started?"  "Is it present now?" ?    Comes and goes ?6. NEUROLOGIC SYMPTOMS: "Have you had any of the following symptoms: headache, dizziness, vision loss, double vision, changes in speech, unsteady on your feet?" ?    Dizziness and numbness when standing ?7. OTHER SYMPTOMS: "Do you have any other symptoms?" ?    falling ? ?Protocols used: Neurologic Deficit-A-AH ? ?

## 2021-07-22 ENCOUNTER — Telehealth: Payer: Self-pay | Admitting: Family Medicine

## 2021-07-22 ENCOUNTER — Ambulatory Visit: Payer: Medicare Other | Admitting: Family Medicine

## 2021-07-22 NOTE — Telephone Encounter (Signed)
Copied from [place] [id number]. Topic: General - Other ?>> Jul 22, 2021  8:21 AM Leward Quan A wrote: ?Reason for CRM: Patient called in stated that his ride called and cancelled on him this morning asking if Dr Margarita Rana could please do a phone visit with him since he can not make it in. Please call patient at Ph#  [phone] ?

## 2021-07-22 NOTE — Telephone Encounter (Signed)
Copied from [place] [id number]. Topic: Quick Communication - Home Health Verbal Orders >> Jul 18, 2021 12:57 PM Pawlus, Apolonio Schneiders wrote: Caller/Agency: [facility] Number: [phone] Requesting: PT Frequency: 1x4

## 2021-07-22 NOTE — Telephone Encounter (Signed)
For the symptoms that he has, he needs an in person visit with any available clinician will he needs to go to the ED. ?

## 2021-07-22 NOTE — Telephone Encounter (Signed)
Not on current med list. ?Requested Prescriptions  ?Pending Prescriptions Disp Refills  ?? varenicline (CHANTIX) 1 MG tablet [Pharmacy Med Name: VARENICLINE  '1MG'$   TAB] 56 tablet 0  ?  Sig: TAKE 1 TABLET BY MOUTH TWICE  DAILY  ?  ? Psychiatry:  Drug Dependence Therapy - varenicline Failed - 07/19/2021  5:04 PM  ?  ?  Failed - Manual Review: Do not refill starter pack. 1 mg tabs may be extended up to one year if the patient has quit smoking but still feels at risk for relapse.  ?  ?  Failed - Cr in normal range and within 180 days  ?  Creatinine, Ser  ?Date Value Ref Range Status  ?06/16/2021 2.47 (H) 0.61 - 1.24 mg/dL Final  ? ?Creatinine,U  ?Date Value Ref Range Status  ?04/18/2015 250.2 mg/dL Final  ? ?Creatinine, Urine  ?Date Value Ref Range Status  ?10/03/2015 35.87 mg/dL Final  ?   ?  ?  Passed - Completed PHQ-2 or PHQ-9 in the last 360 days  ?  ?  Passed - Valid encounter within last 6 months  ?  Recent Outpatient Visits   ?      ? 1 month ago Type 2 diabetes mellitus with stage 4 chronic kidney disease, with long-term current use of insulin ([place])  ? [place], Charlane Ferretti, MD  ? 5 months ago Type 2 diabetes mellitus with stage 4 chronic kidney disease, with long-term current use of insulin ([place])  ? [place], Dionne Bucy, [place]  ? 8 months ago Encounter for [employer] annual wellness exam  ? [place], Enobong, MD  ? 1 year ago Hypertriglyceridemia  ? [place] [facility] [place], Charlane Ferretti, MD  ? 1 year ago Type 2 diabetes mellitus with stage 3a chronic kidney disease, with long-term current use of insulin ([place])  ? [place] Ladell Pier, MD  ?  ?  ?Future Appointments   ?        ? Today [name], MD [place]  ? In 1 month [name], MD [place]  ? In 4 months [name], MD [place]  ?  ? ?  ?  ?  ? ? ?

## 2021-07-22 NOTE — Telephone Encounter (Signed)
Called pt unable to reach left VM to call back  ?

## 2021-07-23 DIAGNOSIS — E1122 Type 2 diabetes mellitus with diabetic chronic kidney disease: Secondary | ICD-10-CM | POA: Diagnosis not present

## 2021-07-23 DIAGNOSIS — R296 Repeated falls: Secondary | ICD-10-CM | POA: Diagnosis not present

## 2021-07-23 DIAGNOSIS — M5410 Radiculopathy, site unspecified: Secondary | ICD-10-CM | POA: Diagnosis not present

## 2021-07-23 DIAGNOSIS — R413 Other amnesia: Secondary | ICD-10-CM | POA: Diagnosis not present

## 2021-07-23 DIAGNOSIS — N189 Chronic kidney disease, unspecified: Secondary | ICD-10-CM | POA: Diagnosis not present

## 2021-07-23 DIAGNOSIS — M5136 Other intervertebral disc degeneration, lumbar region: Secondary | ICD-10-CM | POA: Diagnosis not present

## 2021-07-23 DIAGNOSIS — W19XXXD Unspecified fall, subsequent encounter: Secondary | ICD-10-CM | POA: Diagnosis not present

## 2021-07-23 DIAGNOSIS — R270 Ataxia, unspecified: Secondary | ICD-10-CM | POA: Diagnosis not present

## 2021-07-23 DIAGNOSIS — E1140 Type 2 diabetes mellitus with diabetic neuropathy, unspecified: Secondary | ICD-10-CM | POA: Diagnosis not present

## 2021-07-23 NOTE — Telephone Encounter (Signed)
Verbal orders has been left on VM. ?

## 2021-07-24 ENCOUNTER — Ambulatory Visit: Payer: Self-pay | Admitting: *Deleted

## 2021-07-24 DIAGNOSIS — M5410 Radiculopathy, site unspecified: Secondary | ICD-10-CM | POA: Diagnosis not present

## 2021-07-24 DIAGNOSIS — N189 Chronic kidney disease, unspecified: Secondary | ICD-10-CM | POA: Diagnosis not present

## 2021-07-24 DIAGNOSIS — R296 Repeated falls: Secondary | ICD-10-CM | POA: Diagnosis not present

## 2021-07-24 DIAGNOSIS — R270 Ataxia, unspecified: Secondary | ICD-10-CM | POA: Diagnosis not present

## 2021-07-24 DIAGNOSIS — R413 Other amnesia: Secondary | ICD-10-CM | POA: Diagnosis not present

## 2021-07-24 DIAGNOSIS — E1140 Type 2 diabetes mellitus with diabetic neuropathy, unspecified: Secondary | ICD-10-CM | POA: Diagnosis not present

## 2021-07-24 DIAGNOSIS — E1122 Type 2 diabetes mellitus with diabetic chronic kidney disease: Secondary | ICD-10-CM | POA: Diagnosis not present

## 2021-07-24 DIAGNOSIS — M5136 Other intervertebral disc degeneration, lumbar region: Secondary | ICD-10-CM | POA: Diagnosis not present

## 2021-07-24 DIAGNOSIS — W19XXXD Unspecified fall, subsequent encounter: Secondary | ICD-10-CM | POA: Diagnosis not present

## 2021-07-24 NOTE — Telephone Encounter (Signed)
?  Chief Complaint: hypotension with changing position  ?Symptoms: BP drops when patient stands making him dizzy ?Frequency: chronic ?Pertinent Negatives: Patient denies   ?Disposition: '[]'$ ED /'[]'$ Urgent Care (no appt availability in office) / '[x]'$ Appointment(In office/virtual)/ '[]'$  [place] [facility]/ '[]'$ Home Care/ '[]'$ Refused Recommended Disposition /'[]'$ [place] [facility]/ '[]'$  Follow-up with PCP ?Additional Notes: PT is calling to report patient is having dizziness with change in position- sitting to standing- BP drops- sitting 130/70- standing 90/60. Patient reports this is a chronic problem and he has experienced this for years with no  solution- is requesting virtual appointment to discuss possible referral. ?

## 2021-07-24 NOTE — Telephone Encounter (Signed)
BP drops with standing 100/70- sitting to standing 70/50- 90/60.  ?Patient is experiencing hypotension with change in postion  ?Reason for Disposition ? [1] MILD dizziness (e.g., walking normally) AND [2] has been evaluated by physician for this ? ?Answer Assessment - Initial Assessment Questions ?1. DESCRIPTION: "Describe your dizziness." ?    When stands- feels much better with sitting ?2. LIGHTHEADED: "Do you feel lightheaded?" (e.g., somewhat faint, woozy, weak upon standing) ?    Feels faint ?3. VERTIGO: "Do you feel like either you or the room is spinning or tilting?" (i.e. vertigo) ?    Sometimes with dizziness ?4. SEVERITY: "How bad is it?"  "Do you feel like you are going to faint?" "Can you stand and walk?" ?  - MILD: Feels slightly dizzy, but walking normally. ?  - MODERATE: Feels unsteady when walking, but not falling; interferes with normal activities (e.g., school, work). ?  - SEVERE: Unable to walk without falling, or requires assistance to walk without falling; feels like passing out now.  ?    mild ?5. ONSET:  "When did the dizziness begin?" ?    4 years-sitting to standing ?6. AGGRAVATING FACTORS: "Does anything make it worse?" (e.g., standing, change in head position) ?    *No Answer* ?7. HEART RATE: "Can you tell me your heart rate?" "How many beats in 15 seconds?"  (Note: not all patients can do this)   ?    HR- exertion- 111 ,resting HR- 86 ?8. CAUSE: "What do you think is causing the dizziness?" ?    *No Answer* ?9. RECURRENT SYMPTOM: "Have you had dizziness before?" If Yes, ask: "When was the last time?" "What happened that time?" ?    *No Answer* ?10. OTHER SYMPTOMS: "Do you have any other symptoms?" (e.g., fever, chest pain, vomiting, diarrhea, bleeding) ?      *No Answer* ?11. PREGNANCY: "Is there any chance you are pregnant?" "When was your last menstrual period?" ?      *No Answer* ? ?Protocols used: Dizziness - Lightheadedness-A-AH ? ?

## 2021-07-25 DIAGNOSIS — E1140 Type 2 diabetes mellitus with diabetic neuropathy, unspecified: Secondary | ICD-10-CM | POA: Diagnosis not present

## 2021-07-25 DIAGNOSIS — M5136 Other intervertebral disc degeneration, lumbar region: Secondary | ICD-10-CM | POA: Diagnosis not present

## 2021-07-25 DIAGNOSIS — R413 Other amnesia: Secondary | ICD-10-CM | POA: Diagnosis not present

## 2021-07-25 DIAGNOSIS — M5410 Radiculopathy, site unspecified: Secondary | ICD-10-CM | POA: Diagnosis not present

## 2021-07-25 DIAGNOSIS — E1122 Type 2 diabetes mellitus with diabetic chronic kidney disease: Secondary | ICD-10-CM | POA: Diagnosis not present

## 2021-07-25 DIAGNOSIS — R296 Repeated falls: Secondary | ICD-10-CM | POA: Diagnosis not present

## 2021-07-25 DIAGNOSIS — W19XXXD Unspecified fall, subsequent encounter: Secondary | ICD-10-CM | POA: Diagnosis not present

## 2021-07-25 DIAGNOSIS — N189 Chronic kidney disease, unspecified: Secondary | ICD-10-CM | POA: Diagnosis not present

## 2021-07-25 DIAGNOSIS — R270 Ataxia, unspecified: Secondary | ICD-10-CM | POA: Diagnosis not present

## 2021-07-26 ENCOUNTER — Encounter: Payer: Self-pay | Admitting: Podiatry

## 2021-07-26 NOTE — Progress Notes (Signed)
?  Subjective:  ?Patient ID: [name]., male    DOB: Dec 29, 1965,  MRN: [id number] ? ?[name]. presents to clinic today for at risk foot care with history of diabetic neuropathy and painful thick toenails that are difficult to trim. Pain interferes with ambulation. Aggravating factors include wearing enclosed shoe gear. Pain is relieved with periodic professional debridement. ? ?Patient states blood glucose was 330 mg/dl today.  Last HgA1c was 7.1%. ? ?New problem(s): None.  ? ?PCP is [name], MD , and last visit was May 27, 2021. ? ?Allergies  ?Allergen Reactions  ? Vicodin [Hydrocodone-Acetaminophen] Itching  ? ? ?Review of Systems: Negative except as noted in the HPI. ? ?Objective: No changes noted in today's physical examination. ?Constitutional [name]. is a pleasant 56 y.o. Caucasian male, WD, WN in NAD. AAO x 3.   ?Vascular Capillary refill time to digits immediate b/l. Palpable pedal pulses b/l LE. Pedal hair present. Lower extremity skin temperature gradient within normal limits. No pain with calf compression b/l. No edema noted b/l lower extremities. No cyanosis or clubbing noted.  ?Neurologic Normal speech. Oriented to person, place, and time. Protective sensation diminished with 10g monofilament b/l.  ?Dermatologic Pedal skin with normal turgor, texture and tone b/l lower extremities No open wounds b/l lower extremities No interdigital macerations b/l lower extremities Toenails 1-5 b/l elongated, discolored, dystrophic, thickened, crumbly with subungual debris and tenderness to dorsal palpation. Hyperkeratotic lesion(s) submet head 1 left foot with tenderness to palpation. No edema, no erythema, no drainage, no fluctuance.   ?Orthopedic: Normal muscle strength 5/5 to all lower extremity muscle groups bilaterally. No pain crepitus or joint limitation noted with ROM b/l. No gross bony deformities bilaterally. Utilizes rollator for ambulation assistance.   ? ?Radiographs: None ? ?  Latest Ref Rng & Units 06/11/2021  ?  3:28 PM 05/27/2021  ?  2:26 PM 11/14/2020  ?  1:21 PM 08/30/2020  ?  3:33 PM  ?Hemoglobin A1C  ?Hemoglobin-A1c 4.0 - 5.6 % 7.8   7.1   8.1   8.0    ? ?Assessment/Plan: ?1. Pain due to onychomycosis of toenails of both feet   ?2. Callus   ?3. Diabetic peripheral neuropathy associated with type 2 diabetes mellitus ([place])   ?-Consent given for treatment as described below: ?-Continue diabetic foot care principles: inspect feet daily, monitor glucose as recommended by PCP and/or Endocrinologist, and follow prescribed diet per PCP, Endocrinologist and/or dietician. ?-Mycotic toenails 1-5 bilaterally were debrided in length and girth with sterile nail nippers and dremel without incident. ?-Callus(es) submet head 1 left foot pared utilizing sterile scalpel blade without complication or incident. Total number debrided =1. ?-Patient/POA to call should there be question/concern in the interim.  ? ?Return in about 3 months (around 10/18/2021). ? ?Marzetta Board, DPM  ?

## 2021-07-30 ENCOUNTER — Telehealth (HOSPITAL_BASED_OUTPATIENT_CLINIC_OR_DEPARTMENT_OTHER): Payer: Medicare Other | Admitting: Nurse Practitioner

## 2021-07-30 DIAGNOSIS — I951 Orthostatic hypotension: Secondary | ICD-10-CM | POA: Diagnosis not present

## 2021-07-30 DIAGNOSIS — E1165 Type 2 diabetes mellitus with hyperglycemia: Secondary | ICD-10-CM | POA: Diagnosis not present

## 2021-07-30 NOTE — Progress Notes (Signed)
Virtual Visit Note ?Due to national recommendations of social distancing due to [place] 19, virtual visit is felt to be most appropriate for this patient at this time.  I discussed the limitations, risks, security and privacy concerns of performing an evaluation and management service by video and the availability of in person appointments. I also discussed with the patient that there may be a patient responsible charge related to this service. The patient expressed understanding and agreed to proceed.  ? ? ?I connected with [name]. on 07/31/21  at   1:50 PM EDT  EDT by VIDEO and verified that I am speaking with the correct person using two identifiers. ? ? ?Location of Patient: ?Private Residence ?  ?Location of Provider: ?Scientist, research (physical sciences) and [employer] Office  ?  ?Persons participating in VIRTUAL visit: ?Jonathan Rankins FNP-BC ?[name].  ?  ?History of Present Illness: ?VIRTUAL visit for: Orthostatic Hypotension ? ?Mr. Tipps endorses a long standing history of orthostatic hypotension however I am unable to verify a past diagnosis of this in his medical record or any orthostatic readings aside from  a nurse triage note 07-24-2020 that states Physical therapy observed patient's BP to drop from 130/70 to 90/60 with sitting to standing.  ?Patient states he is a high fall risk and has fallen many times in the past with most recent fall occurring 06-16-2021 for which he was evaluated in the ED with negative work up.  ?Patient lives at home by himself. He does have an aide that assists him during the day for a few hours. He uses a walker for mobility.  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 Current medication list which could be contributing to fall risk includes buproprion, buspar, abilify, depakote, gabapentin, klonopin, tizanidine, seroquel, trazodone, percocet ?In regard to HTN he is only taking renal dose ACE  lisinopril 5 mg  ?He does have diabetes but denies hypoglycemia ?Lab Results  ?Component Value Date  ? HGBA1C 7.8 (A) 06/11/2021  ?  ? ?Past Medical History:  ?Diagnosis Date  ? ADD (attention deficit disorder)   ? Anxiety   ? Arthritis   ? right hip  ? Bipolar 1 disorder ([place])   ? Blood in urine   ? CKD (chronic kidney disease), stage III ([place])   ? Creatinine elevation   ? Dementia ([place])   ? "early onset" (08/04/2017)  ? Depression   ? bipolar [facility]  ? Diabetes mellitus without complication ([place])   ? Family history of anesthesia complication   ? pt is unsure , but pt father may have been difficult to arouse   ? HCAP (healthcare-associated pneumonia) 10/31/2012  ? History of kidney stones   ? Hypertension   ? Hypogonadism male   ? Liver fatty degeneration   ? Microscopic hematuria   ? hereditary s/p Urology eval  ? Neuromuscular disorder ([place])   ? feet neuropathy   ? Osteoarthritis of right hip 11/28/2011  ? 2012 2015 s/p THR Severe  Dr Novella Olive    ? Pleural effusion 11/02/2012  ? Pneumonia 10-2012  ? Pneumonia, organism unspecified(486) 11/02/2012  ? Polysubstance dependence, non-opioid, in remission ([place])   ? remote  ? Primary osteoarthritis of left hip 05/22/2015  ? PTSD (post-traumatic stress disorder)   ? SOCIAL ANXIETY DISORDER   ? Schizoaffective disorder ([place])   ? Substance abuse ([place])   ? Suicide attempt by multiple drug overdose ([place])  12/26/2015  ? Grieving his cat's death 07-15-15  ?  ?Past Surgical History:  ?Procedure Laterality Date  ? BACK SURGERY    ? CLOSED REDUCTION METACARPAL WITH PERCUTANEOUS PINNING Right   ? LUMBAR DISC SURGERY    ? TONSILLECTOMY    ? TOTAL HIP ARTHROPLASTY Right 08/16/2013  ? Procedure: TOTAL HIP ARTHROPLASTY ANTERIOR APPROACH;  Surgeon: Hessie Dibble, MD;  Location: [facility];  Service: Orthopedics;  Laterality: Right;  ? TOTAL HIP ARTHROPLASTY Left 05/22/2015  ? Procedure: TOTAL HIP ARTHROPLASTY ANTERIOR APPROACH;  Surgeon: Melrose Nakayama, MD;  Location: [facility];  Service:  Orthopedics;  Laterality: Left;  ?  ?Family History  ?Problem Relation Age of Onset  ? Diabetes Father   ? Cancer Mother   ?     died of melanoma with mets  ? Cervical cancer Sister   ? Diabetes Sister   ? Other Neg Hx   ?     hypogonadism  ? Colon cancer Neg Hx   ? Colon polyps Neg Hx   ? Esophageal cancer Neg Hx   ? Rectal cancer Neg Hx   ? Stomach cancer Neg Hx   ?  ?Social History  ? ?Socioeconomic History  ? Marital status: Single  ?  Spouse name: Not on file  ? Number of children: 0  ? Years of education: Not on file  ? Highest education level: Some college, no degree  ?Occupational History  ? Occupation: disability  ?Tobacco Use  ? Smoking status: Former  ?  Years: 0.25  ?  Types: Cigarettes  ?  Quit date: 03/22/2019  ?  Years since quitting: 2.3  ? Smokeless tobacco: Never  ?Vaping Use  ? Vaping Use: Never used  ?Substance and Sexual Activity  ? Alcohol use: Not Currently  ? Drug use: No  ?  Comment: hx of marijuana/cocaine/crack use but sober since Jul 15, 2022  ? Sexual activity: Not Currently  ?Other Topics Concern  ? Not on file  ?Social History Narrative  ? 05/08/2021 lives alone, sister [name] helps with meds, he has some in home care, lived with sister until Nov 2020  ? Caffeine- sodas, amount  varies  ? regular exercise-no  ? ?Social Determinants of Health  ? ?Financial Resource Strain: Not on file  ?Food Insecurity: Not on file  ?Transportation Needs: Not on file  ?Physical Activity: Not on file  ?Stress: Not on file  ?Social Connections: Not on file  ?  ? ?Observations/Objective: ?Awake, alert and oriented x 3 ? ? ?Review of Systems  ?Constitutional:  Negative for fever, malaise/fatigue and weight loss.  ?HENT: Negative.  Negative for nosebleeds.   ?Eyes: Negative.  Negative for blurred vision, double vision and photophobia.  ?Respiratory: Negative.  Negative for cough and shortness of breath.   ?Cardiovascular: Negative.  Negative for chest pain, palpitations and leg swelling.  ?Musculoskeletal: Negative.   Negative for myalgias.  ?Neurological:  Positive for dizziness. Negative for focal weakness, seizures, weakness and headaches.  ?Psychiatric/Behavioral:  Negative for suicidal ideas.    ?Assessment and Plan: ?Diagnoses and all orders for this visit: ? ?Postural drop in blood pressure ?Follow up with PCP next month for orthostatic blood pressure readings. ?Avoid sudden movements when attempting to stand from sitting position or turning head suddenly  ?  ? ?Follow Up Instructions ?Return in about 4 weeks (around 08/27/2021) for Orthostatic readings. .  ? ?  ?I discussed the assessment and treatment plan with the patient. The patient was provided an opportunity  to ask questions and all were answered. The patient agreed with the plan and demonstrated an understanding of the instructions. ?  ?The patient was advised to call back or seek an in-person evaluation if the symptoms worsen or if the condition fails to improve as anticipated. ? ?I provided 14 minutes of face-to-face time during this encounter including median intraservice time, reviewing previous notes, labs, imaging, medications and explaining diagnosis and management. ? ?Gildardo Pounds, FNP-BC  ?

## 2021-07-31 ENCOUNTER — Encounter: Payer: Self-pay | Admitting: Nurse Practitioner

## 2021-08-01 DIAGNOSIS — W19XXXD Unspecified fall, subsequent encounter: Secondary | ICD-10-CM | POA: Diagnosis not present

## 2021-08-01 DIAGNOSIS — E1140 Type 2 diabetes mellitus with diabetic neuropathy, unspecified: Secondary | ICD-10-CM | POA: Diagnosis not present

## 2021-08-01 DIAGNOSIS — R296 Repeated falls: Secondary | ICD-10-CM | POA: Diagnosis not present

## 2021-08-01 DIAGNOSIS — E1122 Type 2 diabetes mellitus with diabetic chronic kidney disease: Secondary | ICD-10-CM | POA: Diagnosis not present

## 2021-08-01 DIAGNOSIS — M5136 Other intervertebral disc degeneration, lumbar region: Secondary | ICD-10-CM | POA: Diagnosis not present

## 2021-08-01 DIAGNOSIS — N189 Chronic kidney disease, unspecified: Secondary | ICD-10-CM | POA: Diagnosis not present

## 2021-08-01 DIAGNOSIS — R413 Other amnesia: Secondary | ICD-10-CM | POA: Diagnosis not present

## 2021-08-01 DIAGNOSIS — M5410 Radiculopathy, site unspecified: Secondary | ICD-10-CM | POA: Diagnosis not present

## 2021-08-01 DIAGNOSIS — R270 Ataxia, unspecified: Secondary | ICD-10-CM | POA: Diagnosis not present

## 2021-08-02 DIAGNOSIS — M545 Low back pain, unspecified: Secondary | ICD-10-CM | POA: Diagnosis not present

## 2021-08-02 DIAGNOSIS — Z0000 Encounter for general adult medical examination without abnormal findings: Secondary | ICD-10-CM | POA: Diagnosis not present

## 2021-08-02 DIAGNOSIS — Z79899 Other long term (current) drug therapy: Secondary | ICD-10-CM | POA: Diagnosis not present

## 2021-08-02 DIAGNOSIS — G8929 Other chronic pain: Secondary | ICD-10-CM | POA: Diagnosis not present

## 2021-08-02 DIAGNOSIS — E11610 Type 2 diabetes mellitus with diabetic neuropathic arthropathy: Secondary | ICD-10-CM | POA: Diagnosis not present

## 2021-08-02 DIAGNOSIS — M25552 Pain in left hip: Secondary | ICD-10-CM | POA: Diagnosis not present

## 2021-08-02 DIAGNOSIS — M25551 Pain in right hip: Secondary | ICD-10-CM | POA: Diagnosis not present

## 2021-08-07 DIAGNOSIS — M5410 Radiculopathy, site unspecified: Secondary | ICD-10-CM | POA: Diagnosis not present

## 2021-08-07 DIAGNOSIS — M5136 Other intervertebral disc degeneration, lumbar region: Secondary | ICD-10-CM | POA: Diagnosis not present

## 2021-08-07 DIAGNOSIS — Z79899 Other long term (current) drug therapy: Secondary | ICD-10-CM | POA: Diagnosis not present

## 2021-08-07 DIAGNOSIS — R270 Ataxia, unspecified: Secondary | ICD-10-CM | POA: Diagnosis not present

## 2021-08-07 DIAGNOSIS — R413 Other amnesia: Secondary | ICD-10-CM | POA: Diagnosis not present

## 2021-08-07 DIAGNOSIS — N189 Chronic kidney disease, unspecified: Secondary | ICD-10-CM | POA: Diagnosis not present

## 2021-08-07 DIAGNOSIS — R296 Repeated falls: Secondary | ICD-10-CM | POA: Diagnosis not present

## 2021-08-07 DIAGNOSIS — W19XXXD Unspecified fall, subsequent encounter: Secondary | ICD-10-CM | POA: Diagnosis not present

## 2021-08-07 DIAGNOSIS — E1140 Type 2 diabetes mellitus with diabetic neuropathy, unspecified: Secondary | ICD-10-CM | POA: Diagnosis not present

## 2021-08-07 DIAGNOSIS — E1122 Type 2 diabetes mellitus with diabetic chronic kidney disease: Secondary | ICD-10-CM | POA: Diagnosis not present

## 2021-08-15 ENCOUNTER — Ambulatory Visit: Payer: Medicare Other | Admitting: Dietician

## 2021-08-17 ENCOUNTER — Other Ambulatory Visit: Payer: Self-pay | Admitting: Family Medicine

## 2021-08-17 DIAGNOSIS — M5136 Other intervertebral disc degeneration, lumbar region: Secondary | ICD-10-CM

## 2021-08-19 ENCOUNTER — Ambulatory Visit: Payer: Medicare Other | Admitting: Endocrinology

## 2021-08-19 NOTE — Telephone Encounter (Signed)
Requested medication (s) are due for refill today: Yes ? ?Requested medication (s) are on the active medication list: Yes ? ?Last refill:  Zanaflex 07/10/21 ? ?Future visit scheduled: Yes ? ?Notes to clinic:  Chantix d/c. ? ? ? ?Requested Prescriptions  ?Pending Prescriptions Disp Refills  ? tiZANidine (ZANAFLEX) 4 MG tablet [Pharmacy Med Name: tiZANidine HCl 4 MG Oral Tablet] 90 tablet 1  ?  Sig: TAKE 1 TABLET BY MOUTH EVERY 8  HOURS AS NEEDED FOR MUSCLE  SPASM(S)  ?  ? Not Delegated - Cardiovascular:  Alpha-2 Agonists - tizanidine Failed - 08/17/2021 12:38 PM  ?  ?  Failed - This refill cannot be delegated  ?  ?  Passed - Valid encounter within last 6 months  ?  Recent Outpatient Visits   ? ?      ? 2 weeks ago Postural drop in blood pressure  ? [place], [place] W, NP  ? 2 months ago Type 2 diabetes mellitus with stage 4 chronic kidney disease, with long-term current use of insulin ([place])  ? [place], Charlane Ferretti, MD  ? 6 months ago Type 2 diabetes mellitus with stage 4 chronic kidney disease, with long-term current use of insulin ([place])  ? [name], Dionne Bucy, [place]  ? 9 months ago Encounter for [employer] annual wellness exam  ? [place], [place], MD  ? 1 year ago Hypertriglyceridemia  ? [place] [name], MD  ? ?  ?  ?Future Appointments   ? ?        ? In 1 week [name], MD [place]  ? In 3 months [name], MD [place]  ? ?  ? ? ?  ?  ?  ? varenicline (CHANTIX) 1 MG tablet [Pharmacy Med Name: VARENICLINE  '1MG'$   TAB] 56 tablet 0  ?  Sig: TAKE 1 TABLET BY MOUTH TWICE  DAILY  ?  ? Psychiatry:  Drug Dependence Therapy - varenicline Failed - 08/17/2021 12:38 PM  ?  ?  Failed - Manual Review: Do not refill starter pack. 1 mg tabs may be extended up to  one year if the patient has quit smoking but still feels at risk for relapse.  ?  ?  Failed - Cr in normal range and within 180 days  ?  Creatinine, Ser  ?Date Value Ref Range Status  ?06/16/2021 2.47 (H) 0.61 - 1.24 mg/dL Final  ? ?Creatinine,U  ?Date Value Ref Range Status  ?04/18/2015 250.2 mg/dL Final  ? ?Creatinine, Urine  ?Date Value Ref Range Status  ?10/03/2015 35.87 mg/dL Final  ?  ?  ?  ?  Passed - Completed PHQ-2 or PHQ-9 in the last 360 days  ?  ?  Passed - Valid encounter within last 6 months  ?  Recent Outpatient Visits   ? ?      ? 2 weeks ago Postural drop in blood pressure  ? [place], [place] W, NP  ? 2 months ago Type 2 diabetes mellitus with stage 4 chronic kidney disease, with long-term current use of insulin ([place])  ? [place], Charlane Ferretti, MD  ? 6 months ago Type 2 diabetes mellitus with stage 4 chronic kidney disease, with long-term current use of insulin ([place])  ?  [place], Dionne Bucy, [place]  ? 9 months ago Encounter for [employer] annual wellness exam  ? [place], En[place]ong, MD  ? 1 year ago Hypertriglyceridemia  ? [place] [name], MD  ? ?  ?  ?Future Appointments   ? ?        ? In 1 week [name], MD [place]  ? In 3 months [name], MD [place]  ? ?  ? ? ?  ?  ?  ? ?

## 2021-08-27 ENCOUNTER — Encounter: Payer: Medicare Other | Admitting: Family Medicine

## 2021-08-28 ENCOUNTER — Ambulatory Visit: Payer: Medicare Other | Attending: Family Medicine | Admitting: Family Medicine

## 2021-08-28 ENCOUNTER — Encounter: Payer: Self-pay | Admitting: Family Medicine

## 2021-08-28 VITALS — BP 112/76 | HR 84 | Ht 75.0 in | Wt 216.8 lb

## 2021-08-28 DIAGNOSIS — Z23 Encounter for immunization: Secondary | ICD-10-CM | POA: Diagnosis not present

## 2021-08-28 DIAGNOSIS — N184 Chronic kidney disease, stage 4 (severe): Secondary | ICD-10-CM | POA: Diagnosis not present

## 2021-08-28 DIAGNOSIS — Z794 Long term (current) use of insulin: Secondary | ICD-10-CM | POA: Diagnosis not present

## 2021-08-28 DIAGNOSIS — I152 Hypertension secondary to endocrine disorders: Secondary | ICD-10-CM

## 2021-08-28 DIAGNOSIS — E1159 Type 2 diabetes mellitus with other circulatory complications: Secondary | ICD-10-CM

## 2021-08-28 DIAGNOSIS — F250 Schizoaffective disorder, bipolar type: Secondary | ICD-10-CM

## 2021-08-28 DIAGNOSIS — M5136 Other intervertebral disc degeneration, lumbar region: Secondary | ICD-10-CM | POA: Diagnosis not present

## 2021-08-28 DIAGNOSIS — E1122 Type 2 diabetes mellitus with diabetic chronic kidney disease: Secondary | ICD-10-CM

## 2021-08-28 MED ORDER — ZOSTER VAC RECOMB ADJUVANTED 50 MCG/0.5ML IM SUSR: 0.5000 mL | Freq: Once | INTRAMUSCULAR | 1 refills | Status: AC

## 2021-08-28 NOTE — Progress Notes (Signed)
? ?Subjective:  ?Patient ID: [name]., male    DOB: [birth year]  Age: 56 y.o. MRN: [id number] ? ?CC: Hypertension ? ? ?HPI ?Jaamal Farooqui Nathaniel Wakeley. is a 56 y.o. year old male with a history of type 2 diabetes mellitus (A1c 8.1), bipolar disorder, degenerative disease of the lumbar spine with associated radiculopathy, insomnia, memory loss ? ?Interval History: ?He complains of back pain.  Currently being managed by a spine specialist and has received epidural spinal injections which she states do not work.  He had a nerve block last months which wore off after a month - he goes to [facility].He has not had any recent falls. ?He is also on gabapentin, tizanidine.He no longer takes Tramadol but receives Percocet from [facility]. ? ?His diabetes is managed by [facility] but his endocrinologist Dr. Loanne Drilling left the practice. ?No recent ophthalmology exam ?He has an appt with Endocrine next Tuesday. He drinks 2-3 protein milk shakes which makes his sugars go up. ? ?He saw his therapist yesterday and sees Psych next month ?Past Medical History:  ?Diagnosis Date  ? ADD (attention deficit disorder)   ? Anxiety   ? Arthritis   ? right hip  ? Bipolar 1 disorder ([place])   ? Blood in urine   ? CKD (chronic kidney disease), stage III ([place])   ? Creatinine elevation   ? Dementia ([place])   ? "early onset" (08/04/2017)  ? Depression   ? bipolar [facility]  ? Diabetes mellitus without complication ([place])   ? Family history of anesthesia complication   ? pt is unsure , but pt father may have been difficult to arouse   ? HCAP (healthcare-associated pneumonia) 10/31/2012  ? History of kidney stones   ? Hypertension   ? Hypogonadism male   ? Liver fatty degeneration   ? Microscopic hematuria   ? hereditary s/p Urology eval  ? Neuromuscular disorder ([place])   ? feet neuropathy   ? Osteoarthritis of right hip 11/28/2011  ? 2012 2015 s/p THR Severe  Dr Novella Olive    ? Pleural effusion 11/02/2012  ? Pneumonia  10-2012  ? Pneumonia, organism unspecified(486) 11/02/2012  ? Polysubstance dependence, non-opioid, in remission ([place])   ? remote  ? Primary osteoarthritis of left hip 05/22/2015  ? PTSD (post-traumatic stress disorder)   ? SOCIAL ANXIETY DISORDER   ? Schizoaffective disorder ([place])   ? Substance abuse ([place])   ? Suicide attempt by multiple drug overdose ([place]) 12/28/2015  ? Grieving his cat's death 07-06-15  ? ? ?Past Surgical History:  ?Procedure Laterality Date  ? BACK SURGERY    ? CLOSED REDUCTION METACARPAL WITH PERCUTANEOUS PINNING Right   ? LUMBAR DISC SURGERY    ? TONSILLECTOMY    ? TOTAL HIP ARTHROPLASTY Right 08/16/2013  ? Procedure: TOTAL HIP ARTHROPLASTY ANTERIOR APPROACH;  Surgeon: Hessie Dibble, MD;  Location: [facility];  Service: Orthopedics;  Laterality: Right;  ? TOTAL HIP ARTHROPLASTY Left 05/22/2015  ? Procedure: TOTAL HIP ARTHROPLASTY ANTERIOR APPROACH;  Surgeon: Melrose Nakayama, MD;  Location: [facility];  Service: Orthopedics;  Laterality: Left;  ? ? ?Family History  ?Problem Relation Age of Onset  ? Diabetes Father   ? Cancer Mother   ?     died of melanoma with mets  ? Cervical cancer Sister   ? Diabetes Sister   ? Other Neg Hx   ?     hypogonadism  ? Colon cancer Neg Hx   ? Colon  polyps Neg Hx   ? Esophageal cancer Neg Hx   ? Rectal cancer Neg Hx   ? Stomach cancer Neg Hx   ? ? ?Social History  ? ?Socioeconomic History  ? Marital status: Single  ?  Spouse name: Not on file  ? Number of children: 0  ? Years of education: Not on file  ? Highest education level: Some college, no degree  ?Occupational History  ? Occupation: disability  ?Tobacco Use  ? Smoking status: Former  ?  Years: 0.25  ?  Types: Cigarettes  ?  Quit date: 03/22/2019  ?  Years since quitting: 2.4  ? Smokeless tobacco: Never  ?Vaping Use  ? Vaping Use: Never used  ?Substance and Sexual Activity  ? Alcohol use: Not Currently  ? Drug use: No  ?  Comment: hx of marijuana/cocaine/crack use but sober since 20's  ? Sexual activity: Not Currently  ?Other  Topics Concern  ? Not on file  ?Social History Narrative  ? 05/08/2021 lives alone, sister [name] helps with meds, he has some in home care, lived with sister until Nov 2020  ? Caffeine- sodas, amount  varies  ? regular exercise-no  ? ?Social Determinants of Health  ? ?Financial Resource Strain: Not on file  ?Food Insecurity: Not on file  ?Transportation Needs: Not on file  ?Physical Activity: Not on file  ?Stress: Not on file  ?Social Connections: Not on file  ? ? ?Allergies  ?Allergen Reactions  ? Vicodin [Hydrocodone-Acetaminophen] Itching  ? ? ?Outpatient Medications Prior to Visit  ?Medication Sig Dispense Refill  ? alfuzosin (UROXATRAL) 10 MG 24 hr tablet Take 10 mg by mouth at bedtime.     ? ARIPiprazole (ABILIFY) 5 MG tablet TAKE 1 TABLET BY MOUTH DAILY FOR DEPRESSION (Patient taking differently: Take 5 mg by mouth daily.) 30 tablet 0  ? atorvastatin (LIPITOR) 10 MG tablet TAKE 1 TABLET BY MOUTH AT  BEDTIME 90 tablet 3  ? buPROPion (WELLBUTRIN XL) 300 MG 24 hr tablet Take 1 tablet (300 mg total) by mouth daily. 30 tablet 0  ? busPIRone (BUSPAR) 10 MG tablet Take 10 mg by mouth 2 (two) times daily.    ? busPIRone (BUSPAR) 15 MG tablet Take 15 mg by mouth 2 (two) times daily.    ? chlorhexidine (PERIDEX) 0.12 % solution SMARTSIG:By Mouth    ? ciclopirox (PENLAC) 8 % solution APPLY DAILY TO NAILS. ONCE  A WEEK FILE TOENAIL AND  CLEAN WITH ALCOHOL AND  REAPPLY 13.2 mL 0  ? clonazePAM (KLONOPIN) 0.5 MG tablet Take 0.5 mg by mouth 2 (two) times daily as needed.    ? Continuous Blood Gluc Receiver (FREESTYLE LIBRE READER) DEVI Use as directed 1 each each  ? Continuous Blood Gluc Sensor (FREESTYLE LIBRE 14 DAY SENSOR) MISC Use as directed 2 each 4  ? Continuous Blood Gluc Sensor (FREESTYLE LIBRE 2 SENSOR) MISC by Does not apply route.    ? divalproex (DEPAKOTE ER) 500 MG 24 hr tablet Take 500 mg by mouth daily.    ? fenofibrate (TRICOR) 145 MG tablet TAKE 1 TABLET BY MOUTH DAILY 90 tablet 3  ? FLUARIX QUADRIVALENT 0.5  ML injection     ? fluticasone (FLONASE) 50 MCG/ACT nasal spray USE 1 SPRAY IN BOTH  NOSTRILS DAILY 32 g 1  ? gabapentin (NEURONTIN) 300 MG capsule TAKE 1 CAPSULE BY MOUTH TWICE  DAILY 180 capsule 2  ? glucose blood (ACCU-CHEK GUIDE) test strip CHECK SUGAR FOUR TIMES DAILY 100  each 12  ? Insulin NPH, Human,, Isophane, (HUMULIN N KWIKPEN) 100 UNIT/ML Kiwkpen Inject 45 Units into the skin every morning. And pen needles 1/day 45 mL 3  ? Insulin Pen Needle (B-D UF III MINI PEN NEEDLES) 31G X 5 MM MISC USE 4 TIMES DAILY 120 each 12  ? lisinopril (ZESTRIL) 5 MG tablet TAKE 1 TABLET BY MOUTH  DAILY 90 tablet 3  ? mupirocin ointment (BACTROBAN) 2 % Apply 1 application topically 2 (two) times daily. 30 g 2  ? oxyCODONE-acetaminophen (PERCOCET/ROXICET) 5-325 MG tablet Take by mouth.    ? pantoprazole (PROTONIX) 40 MG tablet TAKE 1 TABLET BY MOUTH  DAILY 90 tablet 3  ? polyethylene glycol (MIRALAX / GLYCOLAX) 17 g packet Take 17 g by mouth daily.    ? QUEtiapine (SEROQUEL) 400 MG tablet Take 400 mg by mouth as needed.    ? tadalafil (CIALIS) 5 MG tablet TAKE ONE TABLET BY MOUTH EVERY DAY FOR BPH 90 tablet 1  ? Testosterone 20.25 MG/ACT (1.62%) GEL Apply topically once daily to the upper arms 75 g 5  ? tiZANidine (ZANAFLEX) 4 MG tablet TAKE 1 TABLET BY MOUTH EVERY 8  HOURS AS NEEDED FOR MUSCLE  SPASM(S) 90 tablet 1  ? traZODone (DESYREL) 150 MG tablet Take 1 tablet (150 mg total) by mouth at bedtime. 90 tablet 1  ? traMADol (ULTRAM) 50 MG tablet Take 1 tablet (50 mg total) by mouth every 12 (twelve) hours as needed. 60 tablet 1  ? ?No facility-administered medications prior to visit.  ? ? ? ?ROS ?Review of Systems  ?Constitutional:  Negative for activity change and appetite change.  ?HENT:  Negative for sinus pressure and sore throat.   ?Eyes:  Negative for visual disturbance.  ?Respiratory:  Negative for cough, chest tightness and shortness of breath.   ?Cardiovascular:  Negative for chest pain and leg swelling.   ?Gastrointestinal:  Negative for abdominal distention, abdominal pain, constipation and diarrhea.  ?Endocrine: Negative.   ?Genitourinary:  Negative for dysuria.  ?Musculoskeletal:  Positive for back pain. Negative

## 2021-08-28 NOTE — Patient Instructions (Signed)

## 2021-08-28 NOTE — Progress Notes (Signed)
Back pain

## 2021-08-29 ENCOUNTER — Other Ambulatory Visit: Payer: Self-pay | Admitting: Family Medicine

## 2021-08-29 DIAGNOSIS — E1165 Type 2 diabetes mellitus with hyperglycemia: Secondary | ICD-10-CM | POA: Diagnosis not present

## 2021-08-29 DIAGNOSIS — E1122 Type 2 diabetes mellitus with diabetic chronic kidney disease: Secondary | ICD-10-CM

## 2021-08-29 DIAGNOSIS — N1831 Chronic kidney disease, stage 3a: Secondary | ICD-10-CM

## 2021-08-29 NOTE — Telephone Encounter (Signed)
Requested medication (s) are due for refill today:   No ? ?Requested medication (s) are on the active medication list:   No ? ?Future visit scheduled:   Yes ? ? ?Last ordered: Discontinued 08/30/2020 by Renato Shin, MD ? ?Returned because a refill request was sent  ? ?Requested Prescriptions  ?Pending Prescriptions Disp Refills  ? TRADJENTA 5 MG TABS tablet [Pharmacy Med Name: Tradjenta 5 MG Oral Tablet] 90 tablet 3  ?  Sig: TAKE 1 TABLET BY MOUTH  DAILY  ?  ? Endocrinology:  Diabetes - DPP-4 Inhibitors - linagliptin Passed - 08/29/2021 11:18 AM  ?  ?  Passed - HBA1C is between 0 and 7.9 and within 180 days  ?  Hemoglobin A1C  ?Date Value Ref Range Status  ?06/11/2021 7.8 (A) 4.0 - 5.6 % Final  ? ?HbA1c, POC (controlled diabetic range)  ?Date Value Ref Range Status  ?05/11/2020 6.4 0.0 - 7.0 % Final  ? ?Hgb A1c MFr Bld  ?Date Value Ref Range Status  ?05/27/2021 7.1 (H) 4.8 - 5.6 % Final  ?  Comment:  ?           Prediabetes: 5.7 - 6.4 ?         Diabetes: >6.4 ?         Glycemic control for adults with diabetes: <7.0 ?  ?   ?  ?  Passed - Valid encounter within last 6 months  ?  Recent Outpatient Visits   ? ?      ? Yesterday Type 2 diabetes mellitus with stage 4 chronic kidney disease, with long-term current use of insulin ([place])  ? [place], Charlane Ferretti, MD  ? 1 month ago Postural drop in blood pressure  ? [place], [place] W, NP  ? 3 months ago Type 2 diabetes mellitus with stage 4 chronic kidney disease, with long-term current use of insulin ([place])  ? [place], Charlane Ferretti, MD  ? 6 months ago Type 2 diabetes mellitus with stage 4 chronic kidney disease, with long-term current use of insulin ([place])  ? [place], Dionne Bucy, [place]  ? 9 months ago Encounter for [employer] annual wellness exam  ? [place], MD  ? ?  ?  ?Future  Appointments   ? ?        ? In 2 months [name], MD [place]  ? ?  ? ? ?  ?  ?  ? ?

## 2021-09-02 ENCOUNTER — Encounter: Payer: Self-pay | Admitting: Internal Medicine

## 2021-09-02 ENCOUNTER — Ambulatory Visit (INDEPENDENT_AMBULATORY_CARE_PROVIDER_SITE_OTHER): Payer: Medicare Other | Admitting: Internal Medicine

## 2021-09-02 ENCOUNTER — Encounter: Payer: Self-pay | Admitting: Family Medicine

## 2021-09-02 VITALS — BP 122/78 | HR 95 | Ht 75.0 in | Wt 218.0 lb

## 2021-09-02 DIAGNOSIS — Z79899 Other long term (current) drug therapy: Secondary | ICD-10-CM | POA: Diagnosis not present

## 2021-09-02 DIAGNOSIS — Z9181 History of falling: Secondary | ICD-10-CM | POA: Diagnosis not present

## 2021-09-02 DIAGNOSIS — M25552 Pain in left hip: Secondary | ICD-10-CM | POA: Diagnosis not present

## 2021-09-02 DIAGNOSIS — E1122 Type 2 diabetes mellitus with diabetic chronic kidney disease: Secondary | ICD-10-CM

## 2021-09-02 DIAGNOSIS — N184 Chronic kidney disease, stage 4 (severe): Secondary | ICD-10-CM

## 2021-09-02 DIAGNOSIS — G8929 Other chronic pain: Secondary | ICD-10-CM | POA: Diagnosis not present

## 2021-09-02 DIAGNOSIS — M545 Low back pain, unspecified: Secondary | ICD-10-CM | POA: Diagnosis not present

## 2021-09-02 DIAGNOSIS — E11610 Type 2 diabetes mellitus with diabetic neuropathic arthropathy: Secondary | ICD-10-CM | POA: Diagnosis not present

## 2021-09-02 DIAGNOSIS — Z794 Long term (current) use of insulin: Secondary | ICD-10-CM

## 2021-09-02 DIAGNOSIS — M25551 Pain in right hip: Secondary | ICD-10-CM | POA: Diagnosis not present

## 2021-09-02 LAB — POCT GLYCOSYLATED HEMOGLOBIN (HGB A1C): Hemoglobin A1C: 9.4 % — AB (ref 4.0–5.6)

## 2021-09-02 MED ORDER — HUMALOG KWIKPEN 200 UNIT/ML ~~LOC~~ SOPN: 10.0000 [IU] | PEN_INJECTOR | Freq: Three times a day (TID) | SUBCUTANEOUS | 3 refills | Status: DC

## 2021-09-02 MED ORDER — LANTUS SOLOSTAR 100 UNIT/ML ~~LOC~~ SOPN: 45.0000 [IU] | PEN_INJECTOR | Freq: Every day | SUBCUTANEOUS | 3 refills | Status: DC

## 2021-09-02 NOTE — Progress Notes (Signed)
Patient ID: Jonathan Hankin., male   DOB: [birth year], 56 y.o.   MRN: [id number] ? ?HPI: ?Jonathan Shaneyfelt. is a 56 y.o.-year-old male, returning for follow-up for DM2, dx in 2012, insulin-dependent since 2017, uncontrolled, with complications (stage 4 CKD, PN, fatty liver). ? ?Patient has a history of schizoaffective disorder and also poor memory.  He has an aide that helps him.  He gives his own insulin but is missing injections. ? ?He drinks 2-3 protein shakes a day. ? ?Reviewed HbA1c: ?Lab Results  ?Component Value Date  ? HGBA1C 7.8 (A) 06/11/2021  ? HGBA1C 7.1 (H) 05/27/2021  ? HGBA1C 8.1 (A) 11/14/2020  ? HGBA1C 8.0 (A) 08/30/2020  ? HGBA1C 7.5 (H) 07/06/2020  ? HGBA1C 6.4 05/11/2020  ? HGBA1C 7.9 (H) 01/03/2020  ? HGBA1C 9.0 (A) 11/01/2019  ? HGBA1C 6.8 08/25/2019  ? HGBA1C 8.3 (H) 05/14/2019  ? ?Pt is on a regimen of: ?- NPH 45 units in a.m. ?Prev. On Lantus,  Levemir, NovoLog, Humalog, Tradjenta,Metformin. ? ?Pt checks his sugars >4x a day with the Libre CGM: ? ? ?Lowest sugar was 60s; he has hypoglycemia awareness at 70.  ?Highest sugar was 400s. ? ?Glucometer: Accu-Chek guide ? ?- + CKD, last BUN/creatinine:  ?Lab Results  ?Component Value Date  ? BUN 32 (H) 06/16/2021  ? BUN 31 (H) 05/27/2021  ? CREATININE 2.47 (H) 06/16/2021  ? CREATININE 2.22 (H) 05/27/2021  ?On lisinopril. ? ?- + HL; last set of lipids: ?Lab Results  ?Component Value Date  ? CHOL 167 07/06/2020  ? HDL 39 (L) 07/06/2020  ? [place] 76 07/06/2020  ? LDLDIRECT 72.0 06/02/2016  ? TRIG 323 (H) 07/06/2020  ? CHOLHDL 4.3 07/06/2020  ?On Lipitor 10 mg daily, Tricor 145 mg daily ? ?- last eye exam was in  09/2020. No DR reportedly.  ? ?- no numbness and tingling in his feet.  On Neurontin 300 mg twice a day.  Last foot exam 01/2021. ? ?He also has a history of hypogonadism, HTN, back pain, bipolar disorder, PTSD, history of suicide attempt, schizoaffective disorder. ? ?ROS: ?+ see HPI ?No blurry vision, nausea, chest pain.  He has  increased urination. ? ?Past Medical History:  ?Diagnosis Date  ? ADD (attention deficit disorder)   ? Anxiety   ? Arthritis   ? right hip  ? Bipolar 1 disorder ([place])   ? Blood in urine   ? CKD (chronic kidney disease), stage III ([place])   ? Creatinine elevation   ? Dementia ([place])   ? "early onset" (08/04/2017)  ? Depression   ? bipolar [facility]  ? Diabetes mellitus without complication ([place])   ? Family history of anesthesia complication   ? pt is unsure , but pt father may have been difficult to arouse   ? HCAP (healthcare-associated pneumonia) 10/31/2012  ? History of kidney stones   ? Hypertension   ? Hypogonadism male   ? Liver fatty degeneration   ? Microscopic hematuria   ? hereditary s/p Urology eval  ? Neuromuscular disorder ([place])   ? feet neuropathy   ? Osteoarthritis of right hip 11/28/2011  ? 2012 2015 s/p THR Severe  Dr Novella Olive    ? Pleural effusion 11/02/2012  ? Pneumonia 10-2012  ? Pneumonia, organism unspecified(486) 11/02/2012  ? Polysubstance dependence, non-opioid, in remission ([place])   ? remote  ? Primary osteoarthritis of left hip 05/22/2015  ? PTSD (post-traumatic stress disorder)   ? SOCIAL ANXIETY DISORDER   ?  Schizoaffective disorder ([place])   ? Substance abuse ([place])   ? Suicide attempt by multiple drug overdose ([place]) Jan 05, 2016  ? Grieving his cat's death 14-Jul-2015  ? ?Past Surgical History:  ?Procedure Laterality Date  ? BACK SURGERY    ? CLOSED REDUCTION METACARPAL WITH PERCUTANEOUS PINNING Right   ? LUMBAR DISC SURGERY    ? TONSILLECTOMY    ? TOTAL HIP ARTHROPLASTY Right 08/16/2013  ? Procedure: TOTAL HIP ARTHROPLASTY ANTERIOR APPROACH;  Surgeon: Hessie Dibble, MD;  Location: [facility];  Service: Orthopedics;  Laterality: Right;  ? TOTAL HIP ARTHROPLASTY Left 05/22/2015  ? Procedure: TOTAL HIP ARTHROPLASTY ANTERIOR APPROACH;  Surgeon: Melrose Nakayama, MD;  Location: [facility];  Service: Orthopedics;  Laterality: Left;  ? ?Social History  ? ?Socioeconomic History  ? Marital status: Single  ?  Spouse name: Not  on file  ? Number of children: 0  ? Years of education: Not on file  ? Highest education level: Some college, no degree  ?Occupational History  ? Occupation: disability  ?Tobacco Use  ? Smoking status: Former  ?  Years: 0.25  ?  Types: Cigarettes  ?  Quit date: 03/22/2019  ?  Years since quitting: 2.4  ? Smokeless tobacco: Never  ?Vaping Use  ? Vaping Use: Never used  ?Substance and Sexual Activity  ? Alcohol use: Not Currently  ? Drug use: No  ?  Comment: hx of marijuana/cocaine/crack use but sober since July 14, 2022  ? Sexual activity: Not Currently  ?Other Topics Concern  ? Not on file  ?Social History Narrative  ? 05/08/2021 lives alone, sister [name] helps with meds, he has some in home care, lived with sister until Nov 2020  ? Caffeine- sodas, amount  varies  ? regular exercise-no  ? ?Social Determinants of Health  ? ?Financial Resource Strain: Not on file  ?Food Insecurity: Not on file  ?Transportation Needs: Not on file  ?Physical Activity: Not on file  ?Stress: Not on file  ?Social Connections: Not on file  ?Intimate Partner Violence: Not on file  ? ?Current Outpatient Medications on File Prior to Visit  ?Medication Sig Dispense Refill  ? alfuzosin (UROXATRAL) 10 MG 24 hr tablet Take 10 mg by mouth at bedtime.     ? ARIPiprazole (ABILIFY) 5 MG tablet TAKE 1 TABLET BY MOUTH DAILY FOR DEPRESSION (Patient taking differently: Take 5 mg by mouth daily.) 30 tablet 0  ? atorvastatin (LIPITOR) 10 MG tablet TAKE 1 TABLET BY MOUTH AT  BEDTIME 90 tablet 3  ? buPROPion (WELLBUTRIN XL) 300 MG 24 hr tablet Take 1 tablet (300 mg total) by mouth daily. 30 tablet 0  ? busPIRone (BUSPAR) 10 MG tablet Take 10 mg by mouth 2 (two) times daily.    ? busPIRone (BUSPAR) 15 MG tablet Take 15 mg by mouth 2 (two) times daily.    ? chlorhexidine (PERIDEX) 0.12 % solution SMARTSIG:By Mouth    ? ciclopirox (PENLAC) 8 % solution APPLY DAILY TO NAILS. ONCE  A WEEK FILE TOENAIL AND  CLEAN WITH ALCOHOL AND  REAPPLY 13.2 mL 0  ? clonazePAM (KLONOPIN) 0.5  MG tablet Take 0.5 mg by mouth 2 (two) times daily as needed.    ? Continuous Blood Gluc Receiver (FREESTYLE LIBRE READER) DEVI Use as directed 1 each each  ? Continuous Blood Gluc Sensor (FREESTYLE LIBRE 14 DAY SENSOR) MISC Use as directed 2 each 4  ? Continuous Blood Gluc Sensor (FREESTYLE LIBRE 2 SENSOR) MISC by Does not apply route.    ?  divalproex (DEPAKOTE ER) 500 MG 24 hr tablet Take 500 mg by mouth daily.    ? fenofibrate (TRICOR) 145 MG tablet TAKE 1 TABLET BY MOUTH DAILY 90 tablet 3  ? FLUARIX QUADRIVALENT 0.5 ML injection     ? fluticasone (FLONASE) 50 MCG/ACT nasal spray USE 1 SPRAY IN BOTH  NOSTRILS DAILY 32 g 1  ? gabapentin (NEURONTIN) 300 MG capsule TAKE 1 CAPSULE BY MOUTH TWICE  DAILY 180 capsule 2  ? glucose blood (ACCU-CHEK GUIDE) test strip CHECK SUGAR FOUR TIMES DAILY 100 each 12  ? Insulin NPH, Human,, Isophane, (HUMULIN N KWIKPEN) 100 UNIT/ML Kiwkpen Inject 45 Units into the skin every morning. And pen needles 1/day 45 mL 3  ? Insulin Pen Needle (B-D UF III MINI PEN NEEDLES) 31G X 5 MM MISC USE 4 TIMES DAILY 120 each 12  ? lisinopril (ZESTRIL) 5 MG tablet TAKE 1 TABLET BY MOUTH  DAILY 90 tablet 3  ? mupirocin ointment (BACTROBAN) 2 % Apply 1 application topically 2 (two) times daily. 30 g 2  ? oxyCODONE-acetaminophen (PERCOCET/ROXICET) 5-325 MG tablet Take by mouth.    ? pantoprazole (PROTONIX) 40 MG tablet TAKE 1 TABLET BY MOUTH  DAILY 90 tablet 3  ? polyethylene glycol (MIRALAX / GLYCOLAX) 17 g packet Take 17 g by mouth daily.    ? QUEtiapine (SEROQUEL) 400 MG tablet Take 400 mg by mouth as needed.    ? tadalafil (CIALIS) 5 MG tablet TAKE ONE TABLET BY MOUTH EVERY DAY FOR BPH 90 tablet 1  ? Testosterone 20.25 MG/ACT (1.62%) GEL Apply topically once daily to the upper arms 75 g 5  ? tiZANidine (ZANAFLEX) 4 MG tablet TAKE 1 TABLET BY MOUTH EVERY 8  HOURS AS NEEDED FOR MUSCLE  SPASM(S) 90 tablet 1  ? traZODone (DESYREL) 150 MG tablet Take 1 tablet (150 mg total) by mouth at bedtime. 90 tablet 1   ? ?No current facility-administered medications on file prior to visit.  ? ?Allergies  ?Allergen Reactions  ? Vicodin [Hydrocodone-Acetaminophen] Itching  ? ?Family History  ?Problem Relation Age of O

## 2021-09-02 NOTE — Patient Instructions (Addendum)
Please stop NPH and restart: ?- Lantus 45 units before b'fast (may increase to 50 units if needed) ? ?Start: ?- Humalog U200 10-14 units 15 min before each meal ? ?Please come back for a follow-up appointment in 3 months.  ?

## 2021-09-04 DIAGNOSIS — M47816 Spondylosis without myelopathy or radiculopathy, lumbar region: Secondary | ICD-10-CM | POA: Diagnosis not present

## 2021-09-06 ENCOUNTER — Other Ambulatory Visit: Payer: Self-pay | Admitting: Family Medicine

## 2021-09-09 NOTE — Telephone Encounter (Signed)
Requested medications are due for refill today.  unsure  Requested medications are on the active medications list.  no  Last refill. 07/10/2021 #56 0 refills  Future visit scheduled.   yes  Notes to clinic.  Medication was discontinued 07/19/2021    Requested Prescriptions  Pending Prescriptions Disp Refills   varenicline (CHANTIX) 1 MG tablet [Pharmacy Med Name: VARENICLINE  '1MG'$   TAB] 56 tablet 0    Sig: TAKE 1 TABLET BY MOUTH TWICE  DAILY     Psychiatry:  Drug Dependence Therapy - varenicline Failed - 09/06/2021  8:02 PM      Failed - Manual Review: Do not refill starter pack. 1 mg tabs may be extended up to one year if the patient has quit smoking but still feels at risk for relapse.      Failed - Cr in normal range and within 180 days    Creatinine, Ser  Date Value Ref Range Status  06/16/2021 2.47 (H) 0.61 - 1.24 mg/dL Final   Creatinine,U  Date Value Ref Range Status  04/18/2015 250.2 mg/dL Final   Creatinine, Urine  Date Value Ref Range Status  10/03/2015 35.87 mg/dL Final         Passed - Completed PHQ-2 or PHQ-9 in the last 360 days      Passed - Valid encounter within last 6 months    Recent Outpatient Visits           1 week ago Type 2 diabetes mellitus with stage 4 chronic kidney disease, with long-term current use of insulin ([place])   [place], [place], MD   1 month ago Postural drop in blood pressure   [place], [place] W, NP   3 months ago Type 2 diabetes mellitus with stage 4 chronic kidney disease, with long-term current use of insulin ([place])   [place], [place], MD   7 months ago Type 2 diabetes mellitus with stage 4 chronic kidney disease, with long-term current use of insulin [facility])   [place], [place], [place]   9 months ago Encounter for [employer] annual wellness exam   [place], MD       Future Appointments             In 2 months [name], MD [place]

## 2021-09-11 ENCOUNTER — Other Ambulatory Visit: Payer: Self-pay | Admitting: Family Medicine

## 2021-09-12 NOTE — Telephone Encounter (Signed)
Chantix was d/c'd per patient preference back in March of this year. He had a visit with his PCP 08/28/2021 and Chantix was not discussed. He is requesting Chantix refills of the 1 mg dose but it may be more appropriate to start him on the starting dose since he's been without for a while. His PCP is out this week. Will forward information to provider covering for her.

## 2021-09-16 IMAGING — CR DG FOOT COMPLETE 3+V*R*
3 series · 3 of 3 positions shown · non-contrast
Comparison: 04/23/2019

CLINICAL DATA: Fell, pain, bruising and swelling of right foot

EXAM:
RIGHT FOOT COMPLETE - 3+ VIEW

[foot ap]
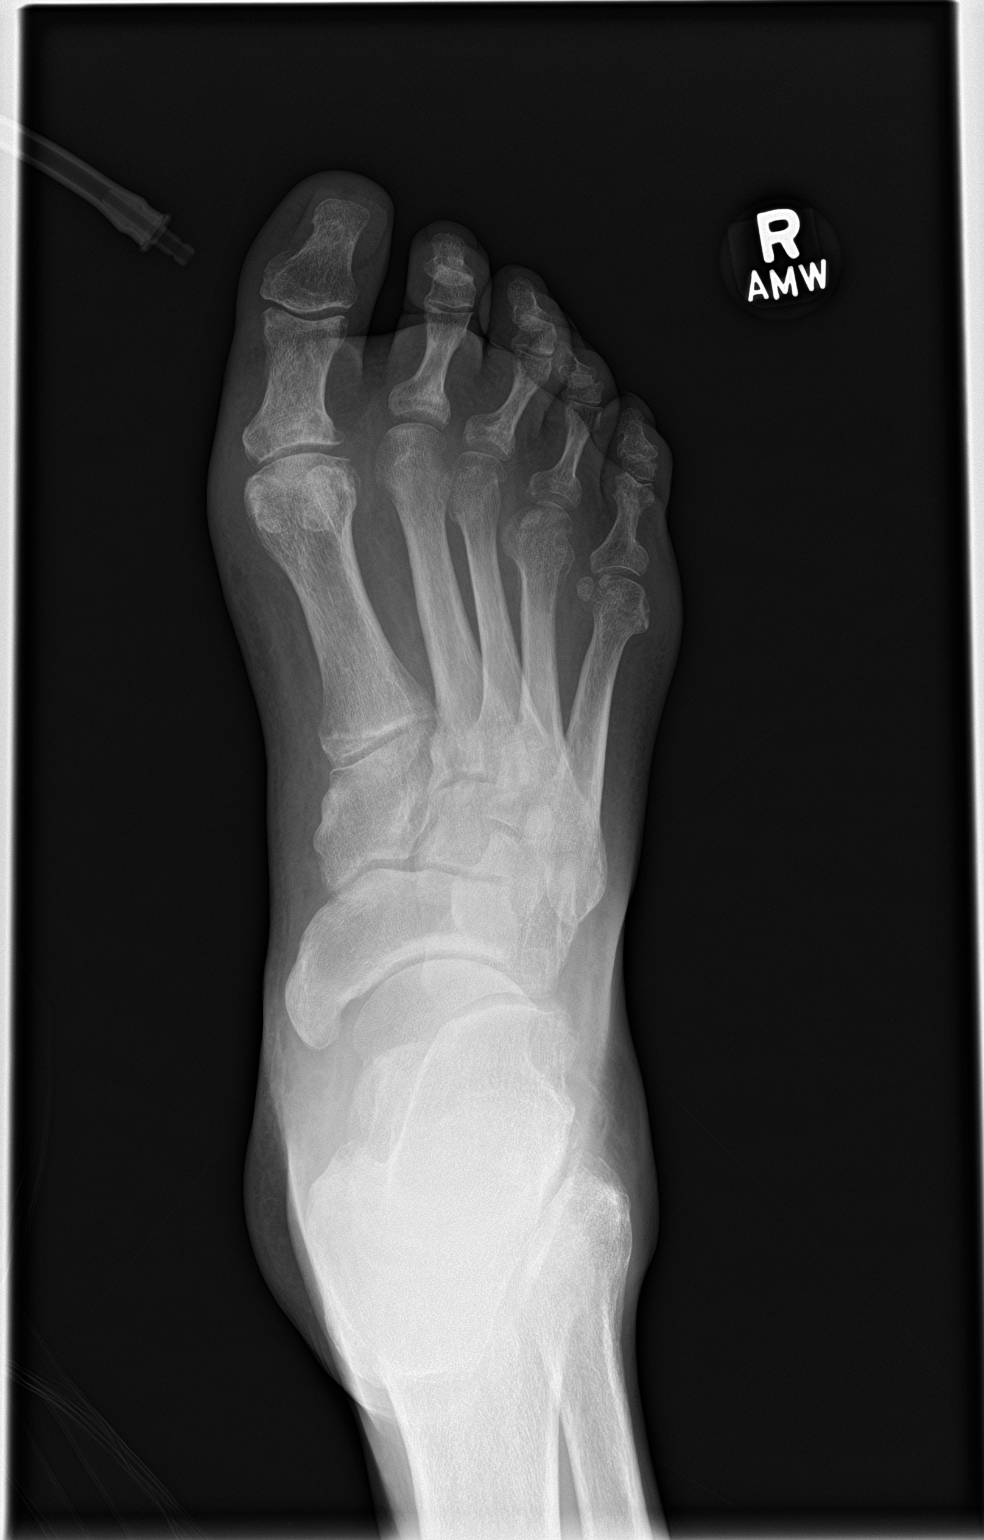

[foot obl]
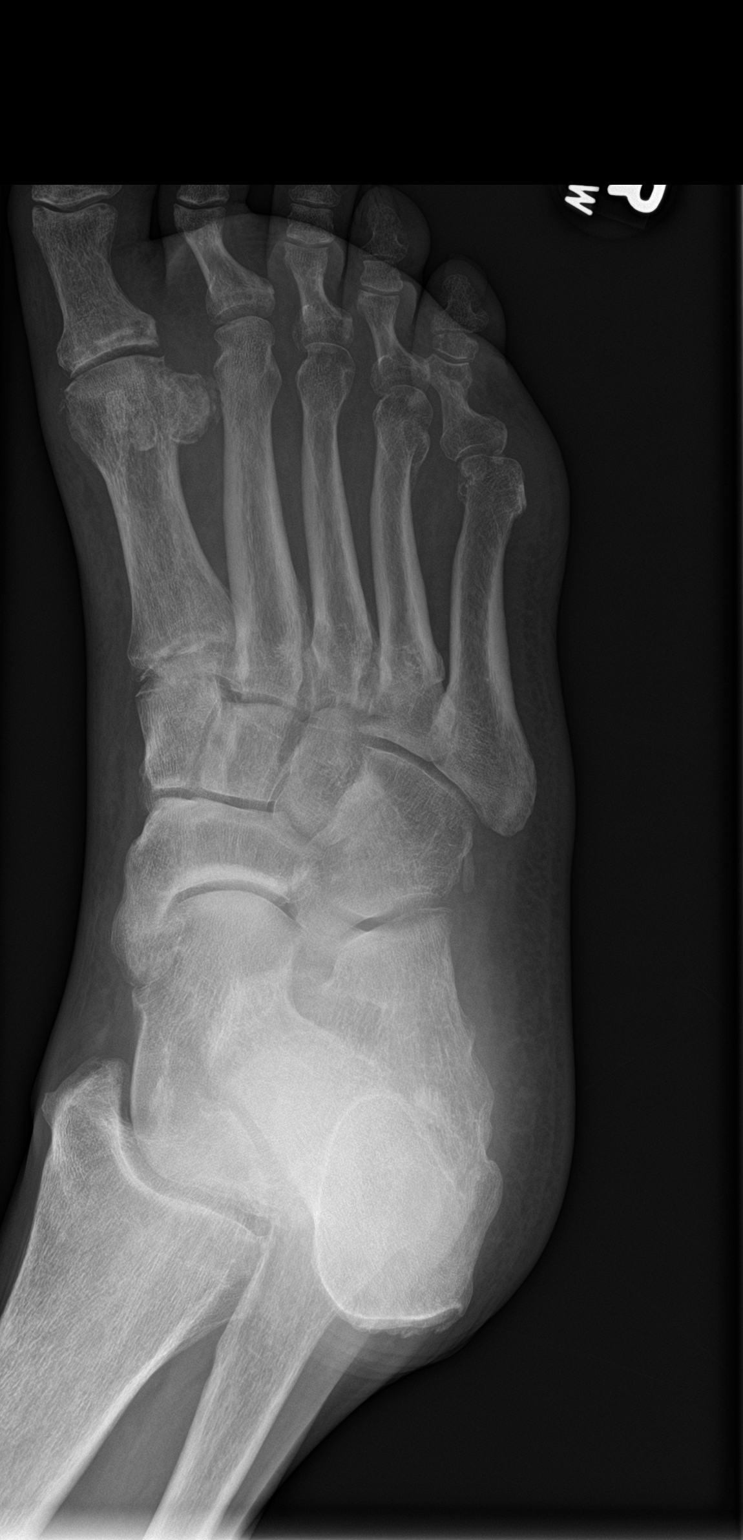

[foot lat]
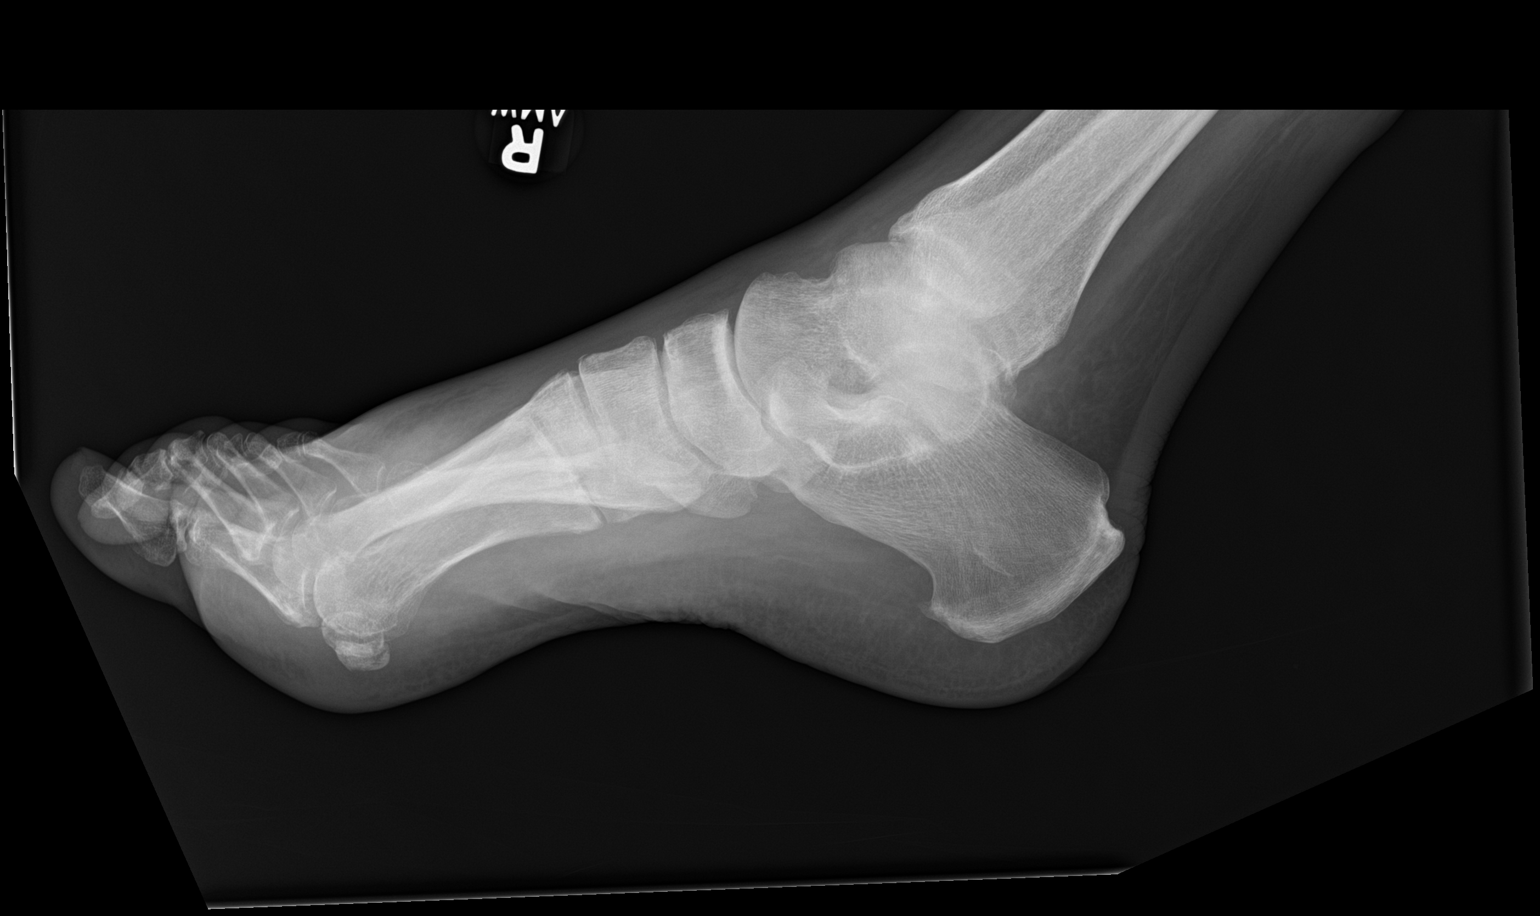

[3 of 3 positions shown; findings below may reference images not displayed]

FINDINGS: Frontal, oblique, and lateral views of the right foot are obtained.
There is a minimally displaced fracture at the base of the fourth
proximal phalanx, best seen on the oblique projection. No other
acute displaced fractures. Prominent dorsal soft tissue swelling of
the forefoot.
IMPRESSION: 1. Minimally displaced fracture at the base of the fourth proximal
phalanx, with prominent soft tissue swelling of the dorsum of the
forefoot.

## 2021-09-19 ENCOUNTER — Other Ambulatory Visit: Payer: Self-pay | Admitting: Family Medicine

## 2021-09-21 ENCOUNTER — Other Ambulatory Visit: Payer: Self-pay | Admitting: Family Medicine

## 2021-09-23 NOTE — Telephone Encounter (Signed)
Requested medications are due for refill today.  unsure  Requested medications are on the active medications list.  no  Last refill. 07/10/2021  Future visit scheduled.   yes  Notes to clinic.  Medication was discontinued 07/19/2021.    Requested Prescriptions  Pending Prescriptions Disp Refills   varenicline (CHANTIX) 1 MG tablet [Pharmacy Med Name: VARENICLINE  '1MG'$   TAB] 56 tablet 0    Sig: TAKE 1 TABLET BY MOUTH TWICE  DAILY     Psychiatry:  Drug Dependence Therapy - varenicline Failed - 09/21/2021  5:39 AM      Failed - Manual Review: Do not refill starter pack. 1 mg tabs may be extended up to one year if the patient has quit smoking but still feels at risk for relapse.      Failed - Cr in normal range and within 180 days    Creatinine, Ser  Date Value Ref Range Status  06/16/2021 2.47 (H) 0.61 - 1.24 mg/dL Final   Creatinine,U  Date Value Ref Range Status  04/18/2015 250.2 mg/dL Final   Creatinine, Urine  Date Value Ref Range Status  10/03/2015 35.87 mg/dL Final         Passed - Completed PHQ-2 or PHQ-9 in the last 360 days      Passed - Valid encounter within last 6 months    Recent Outpatient Visits           3 weeks ago Type 2 diabetes mellitus with stage 4 chronic kidney disease, with long-term current use of insulin ([place])   Benson, T[place], MD   1 month ago Postural drop in blood pressure   [place], [place] W, NP   3 months ago Type 2 diabetes mellitus with stage 4 chronic kidney disease, with long-term current use of insulin ([place])   [place], [place], MD   7 months ago Type 2 diabetes mellitus with stage 4 chronic kidney disease, with long-term current use of insulin [facility])   [place], [place], [place]   10 months ago Encounter for [employer] annual wellness exam   [place], MD       Future Appointments             In 1 month [name], MD [place]

## 2021-09-24 ENCOUNTER — Telehealth: Payer: Self-pay

## 2021-09-24 ENCOUNTER — Encounter: Payer: Self-pay | Admitting: Family Medicine

## 2021-09-24 ENCOUNTER — Telehealth: Payer: Self-pay | Admitting: Family Medicine

## 2021-09-24 NOTE — Telephone Encounter (Signed)
Copied from [place] [id number]. Topic: General - Other >> Sep 24, 2021  3:05 PM Tessa Lerner A wrote: Reason for CRM: The patient has called to check on the status of completion of their [place] / [place] paperwork   The patient has been in contact with [employer] and been told that no information has been received from their PCP  Please contact further when possible

## 2021-09-24 NOTE — Telephone Encounter (Signed)
Patient is calling to check the status of his transportation paperwork.

## 2021-09-24 NOTE — Telephone Encounter (Signed)
I called patient and informed him that we have not received an application for [employer] [place]. He said he dropped it off at [facility] about a month ago but he has an appointment with his therapist tomorrow and he will have her assist him with starting another application.  He will then bring that application to [facility] for provider to complete Part B. I explained that his therapist should be able to complete the entire application for him but he said he tried that in the past and the application was returned to him to have his PCP complete it

## 2021-09-25 DIAGNOSIS — M47816 Spondylosis without myelopathy or radiculopathy, lumbar region: Secondary | ICD-10-CM | POA: Diagnosis not present

## 2021-09-25 NOTE — Telephone Encounter (Signed)
Paperwork has been received and given to case manager for completion.

## 2021-09-26 NOTE — Telephone Encounter (Signed)
I called the patient and completed part B of [employer] [place] application.  It was then faxed to [employer].

## 2021-09-28 DIAGNOSIS — E1165 Type 2 diabetes mellitus with hyperglycemia: Secondary | ICD-10-CM | POA: Diagnosis not present

## 2021-10-01 DIAGNOSIS — M25552 Pain in left hip: Secondary | ICD-10-CM | POA: Diagnosis not present

## 2021-10-01 DIAGNOSIS — Z79899 Other long term (current) drug therapy: Secondary | ICD-10-CM | POA: Diagnosis not present

## 2021-10-01 DIAGNOSIS — M25551 Pain in right hip: Secondary | ICD-10-CM | POA: Diagnosis not present

## 2021-10-01 DIAGNOSIS — M545 Low back pain, unspecified: Secondary | ICD-10-CM | POA: Diagnosis not present

## 2021-10-01 DIAGNOSIS — G8929 Other chronic pain: Secondary | ICD-10-CM | POA: Diagnosis not present

## 2021-10-01 DIAGNOSIS — Z9181 History of falling: Secondary | ICD-10-CM | POA: Diagnosis not present

## 2021-10-01 DIAGNOSIS — E11610 Type 2 diabetes mellitus with diabetic neuropathic arthropathy: Secondary | ICD-10-CM | POA: Diagnosis not present

## 2021-10-04 DIAGNOSIS — Z79899 Other long term (current) drug therapy: Secondary | ICD-10-CM | POA: Diagnosis not present

## 2021-10-05 ENCOUNTER — Other Ambulatory Visit: Payer: Self-pay | Admitting: Family Medicine

## 2021-10-05 DIAGNOSIS — E349 Endocrine disorder, unspecified: Secondary | ICD-10-CM

## 2021-10-07 NOTE — Telephone Encounter (Signed)
VArenicline discontinued 07/19/21 Requested Prescriptions  Pending Prescriptions Disp Refills  . Testosterone 20.25 MG/ACT (1.62%) GEL [Pharmacy Med Name: TESTOSTERONE GEL PUMP 1.62% (ANDROGEL)] 75 g     Sig: APPLY TOPICALLY 1 PUMP ONCE  DAILY TO THE UPPER ARMS     Off-Protocol Failed - 10/05/2021 12:37 PM      Failed - Medication not assigned to a protocol, review manually.      Passed - Valid encounter within last 12 months    Recent Outpatient Visits          1 month ago Type 2 diabetes mellitus with stage 4 chronic kidney disease, with long-term current use of insulin ([place])   [place], [place], MD   2 months ago Postural drop in blood pressure   [place], [place] W, NP   4 months ago Type 2 diabetes mellitus with stage 4 chronic kidney disease, with long-term current use of insulin ([place])   [place], Charlane Ferretti, MD   8 months ago Type 2 diabetes mellitus with stage 4 chronic kidney disease, with long-term current use of insulin [facility])   [place], [place], [place]   10 months ago Encounter for [employer] annual wellness exam   [place], MD      Future Appointments            In 1 month [name], MD [place]           . varenicline (CHANTIX) 1 MG tablet [Pharmacy Med Name: VARENICLINE  '1MG'$   TAB] 56 tablet 0    Sig: TAKE 1 TABLET BY MOUTH TWICE  DAILY     Psychiatry:  Drug Dependence Therapy - varenicline Failed - 10/05/2021 12:37 PM      Failed - Manual Review: Do not refill starter pack. 1 mg tabs may be extended up to one year if the patient has quit smoking but still feels at risk for relapse.      Failed - Cr in normal range and within 180 days    Creatinine, Ser  Date Value Ref Range Status  06/16/2021 2.47 (H) 0.61 - 1.24 mg/dL Final    Creatinine,U  Date Value Ref Range Status  04/18/2015 250.2 mg/dL Final   Creatinine, Urine  Date Value Ref Range Status  10/03/2015 35.87 mg/dL Final         Passed - Completed PHQ-2 or PHQ-9 in the last 360 days      Passed - Valid encounter within last 6 months    Recent Outpatient Visits          1 month ago Type 2 diabetes mellitus with stage 4 chronic kidney disease, with long-term current use of insulin ([place])   [place], [place], MD   2 months ago Postural drop in blood pressure   [place], [place] W, NP   4 months ago Type 2 diabetes mellitus with stage 4 chronic kidney disease, with long-term current use of insulin ([place])   [place], [place], MD   8 months ago Type 2 diabetes mellitus with stage 4 chronic kidney disease, with long-term current use of insulin [facility])   [place], [place], [place]   10 months ago Encounter for [employer]  annual wellness exam   [place], [place]obong, MD      Future Appointments            In 1 month [name], MD [place]

## 2021-10-12 ENCOUNTER — Other Ambulatory Visit: Payer: Self-pay | Admitting: Family Medicine

## 2021-10-24 ENCOUNTER — Other Ambulatory Visit: Payer: Self-pay | Admitting: Family Medicine

## 2021-10-25 DIAGNOSIS — G8928 Other chronic postprocedural pain: Secondary | ICD-10-CM | POA: Diagnosis not present

## 2021-10-25 DIAGNOSIS — Z9181 History of falling: Secondary | ICD-10-CM | POA: Diagnosis not present

## 2021-10-25 DIAGNOSIS — Z96643 Presence of artificial hip joint, bilateral: Secondary | ICD-10-CM | POA: Diagnosis not present

## 2021-10-25 DIAGNOSIS — M25552 Pain in left hip: Secondary | ICD-10-CM | POA: Diagnosis not present

## 2021-10-25 DIAGNOSIS — M546 Pain in thoracic spine: Secondary | ICD-10-CM | POA: Diagnosis not present

## 2021-10-25 DIAGNOSIS — M25551 Pain in right hip: Secondary | ICD-10-CM | POA: Diagnosis not present

## 2021-10-25 DIAGNOSIS — M542 Cervicalgia: Secondary | ICD-10-CM | POA: Diagnosis not present

## 2021-10-25 DIAGNOSIS — Z79899 Other long term (current) drug therapy: Secondary | ICD-10-CM | POA: Diagnosis not present

## 2021-10-25 DIAGNOSIS — G8929 Other chronic pain: Secondary | ICD-10-CM | POA: Diagnosis not present

## 2021-10-25 DIAGNOSIS — M545 Low back pain, unspecified: Secondary | ICD-10-CM | POA: Diagnosis not present

## 2021-10-25 DIAGNOSIS — R030 Elevated blood-pressure reading, without diagnosis of hypertension: Secondary | ICD-10-CM | POA: Diagnosis not present

## 2021-10-25 NOTE — Telephone Encounter (Signed)
Requested medication (s) are due for refill today: yes  Requested medication (s) are on the active medication list: no  Last refill:  07/10/21  Future visit scheduled: yes  Notes to clinic:  rx was dc'd on 07/19/21 but pt requesting refill. Please advise     Requested Prescriptions  Pending Prescriptions Disp Refills   varenicline (CHANTIX) 1 MG tablet [Pharmacy Med Name: VARENICLINE  '1MG'$   TAB] 56 tablet 0    Sig: TAKE 1 TABLET BY MOUTH TWICE  DAILY     Psychiatry:  Drug Dependence Therapy - varenicline Failed - 10/24/2021  8:10 PM      Failed - Manual Review: Do not refill starter pack. 1 mg tabs may be extended up to one year if the patient has quit smoking but still feels at risk for relapse.      Failed - Cr in normal range and within 180 days    Creatinine, Ser  Date Value Ref Range Status  06/16/2021 2.47 (H) 0.61 - 1.24 mg/dL Final   Creatinine,U  Date Value Ref Range Status  04/18/2015 250.2 mg/dL Final   Creatinine, Urine  Date Value Ref Range Status  10/03/2015 35.87 mg/dL Final         Passed - Completed PHQ-2 or PHQ-9 in the last 360 days      Passed - Valid encounter within last 6 months    Recent Outpatient Visits           1 month ago Type 2 diabetes mellitus with stage 4 chronic kidney disease, with long-term current use of insulin ([place])   [place], [place], MD   2 months ago Postural drop in blood pressure   [place], [place] W, NP   5 months ago Type 2 diabetes mellitus with stage 4 chronic kidney disease, with long-term current use of insulin ([place])   [place], [place], MD   8 months ago Type 2 diabetes mellitus with stage 4 chronic kidney disease, with long-term current use of insulin [facility])   [place], [place], [place]   11 months ago Encounter for [employer] annual wellness exam   [place], MD       Future Appointments             In 3 weeks [name], MD [place]

## 2021-10-28 ENCOUNTER — Ambulatory Visit (INDEPENDENT_AMBULATORY_CARE_PROVIDER_SITE_OTHER): Payer: Medicare Other | Admitting: Podiatry

## 2021-10-28 ENCOUNTER — Encounter: Payer: Self-pay | Admitting: Podiatry

## 2021-10-28 DIAGNOSIS — B351 Tinea unguium: Secondary | ICD-10-CM | POA: Diagnosis not present

## 2021-10-28 DIAGNOSIS — L84 Corns and callosities: Secondary | ICD-10-CM

## 2021-10-28 DIAGNOSIS — E1142 Type 2 diabetes mellitus with diabetic polyneuropathy: Secondary | ICD-10-CM

## 2021-10-28 DIAGNOSIS — M79674 Pain in right toe(s): Secondary | ICD-10-CM

## 2021-10-28 DIAGNOSIS — M79675 Pain in left toe(s): Secondary | ICD-10-CM

## 2021-10-28 DIAGNOSIS — E1165 Type 2 diabetes mellitus with hyperglycemia: Secondary | ICD-10-CM | POA: Diagnosis not present

## 2021-10-28 NOTE — Progress Notes (Unsigned)
  Subjective:  Patient ID: [name]., male    DOB: [birth year],  MRN: [id number]  [name]. presents to clinic today for at risk foot care with history of diabetic neuropathy and callus(es) left lower extremity and painful thick toenails that are difficult to trim. Painful toenails interfere with ambulation. Aggravating factors include wearing enclosed shoe gear. Pain is relieved with periodic professional debridement. Painful calluses are aggravated when weightbearing with and without shoegear. Pain is relieved with periodic professional debridement.  Patient states blood glucose was 198 mg/dl today.  Last A1c was 12 or 14%.  New problem(s): None.   PCP is [name], MD , and last visit was  Aug 28, 2021  Endocrinologist is Dr. Philemon Kingdom and last visit was 09/02/2021.  Allergies  Allergen Reactions   Vicodin [Hydrocodone-Acetaminophen] Itching    Review of Systems: Negative except as noted in the HPI.  Objective: No changes noted in today's physical examination. Constitutional [name]. is a pleasant 56 y.o. Caucasian male, WD, WN in NAD. AAO x 3.   Vascular Capillary refill time to digits immediate b/l. Palpable pedal pulses b/l LE. Pedal hair present. Lower extremity skin temperature gradient within normal limits. No pain with calf compression b/l. No edema noted b/l lower extremities. No cyanosis or clubbing noted.  Neurologic Normal speech. Oriented to person, place, and time. Protective sensation diminished with 10g monofilament b/l.  Dermatologic Pedal skin with normal turgor, texture and tone b/l lower extremities No open wounds b/l lower extremities No interdigital macerations b/l lower extremities Toenails 1-5 b/l elongated, discolored, dystrophic, thickened, crumbly with subungual debris and tenderness to dorsal palpation. Hyperkeratotic lesion(s) submet head 1 left foot with tenderness to palpation. No edema, no erythema, no  drainage, no fluctuance.   Orthopedic: Normal muscle strength 5/5 to all lower extremity muscle groups bilaterally. No pain crepitus or joint limitation noted with ROM b/l. No gross bony deformities bilaterally. Utilizes rollator for ambulation assistance.   Radiographs: None    Latest Ref Rng & Units 09/02/2021    1:53 PM 06/11/2021    3:28 PM 05/27/2021    2:26 PM 11/14/2020    1:21 PM  Hemoglobin A1C  Hemoglobin-A1c 4.0 - 5.6 % 9.4  7.8  7.1  8.1     Assessment/Plan: 1. Pain due to onychomycosis of toenails of both feet   2. Callus   3. Diabetic peripheral neuropathy associated with type 2 diabetes mellitus ([place])     -Patient was evaluated and treated. All patient's and/or POA's questions/concerns answered on today's visit. -Continue foot and shoe inspections daily. Monitor blood glucose per PCP/Endocrinologist's recommendations. -Toenails 1-5 b/l were debrided in length and girth with sterile nail nippers and dremel without iatrogenic bleeding.  -Callus(es) submet head 1 left foot pared utilizing sterile scalpel blade without complication or incident. Total number debrided =1. -Patient/POA to call should there be question/concern in the interim.   Return in about 3 months (around 01/28/2022).  Marzetta Board, DPM

## 2021-10-31 ENCOUNTER — Telehealth: Payer: Self-pay

## 2021-10-31 NOTE — Telephone Encounter (Signed)
I spoke to [name] and she said she never received part B of the application that was faxed 09/30/2021.   I refaxed to [place] eligibility today.

## 2021-11-01 DIAGNOSIS — Z79899 Other long term (current) drug therapy: Secondary | ICD-10-CM | POA: Diagnosis not present

## 2021-11-01 DIAGNOSIS — M546 Pain in thoracic spine: Secondary | ICD-10-CM | POA: Diagnosis not present

## 2021-11-01 DIAGNOSIS — M47816 Spondylosis without myelopathy or radiculopathy, lumbar region: Secondary | ICD-10-CM | POA: Diagnosis not present

## 2021-11-07 NOTE — Telephone Encounter (Signed)
Pt is calling to report the [name]  has not received [place] paperwork. Fax- [phone]

## 2021-11-19 ENCOUNTER — Ambulatory Visit: Payer: Medicare Other | Attending: Family Medicine | Admitting: Family Medicine

## 2021-11-19 ENCOUNTER — Encounter: Payer: Self-pay | Admitting: Family Medicine

## 2021-11-19 VITALS — BP 100/64 | HR 103 | Ht 75.0 in | Wt 207.4 lb

## 2021-11-19 DIAGNOSIS — E1122 Type 2 diabetes mellitus with diabetic chronic kidney disease: Secondary | ICD-10-CM | POA: Diagnosis not present

## 2021-11-19 DIAGNOSIS — M5136 Other intervertebral disc degeneration, lumbar region: Secondary | ICD-10-CM | POA: Diagnosis not present

## 2021-11-19 DIAGNOSIS — M47812 Spondylosis without myelopathy or radiculopathy, cervical region: Secondary | ICD-10-CM

## 2021-11-19 DIAGNOSIS — M25551 Pain in right hip: Secondary | ICD-10-CM

## 2021-11-19 DIAGNOSIS — E349 Endocrine disorder, unspecified: Secondary | ICD-10-CM | POA: Diagnosis not present

## 2021-11-19 DIAGNOSIS — Z794 Long term (current) use of insulin: Secondary | ICD-10-CM | POA: Diagnosis not present

## 2021-11-19 DIAGNOSIS — E1159 Type 2 diabetes mellitus with other circulatory complications: Secondary | ICD-10-CM

## 2021-11-19 DIAGNOSIS — N184 Chronic kidney disease, stage 4 (severe): Secondary | ICD-10-CM | POA: Diagnosis not present

## 2021-11-19 DIAGNOSIS — N1831 Chronic kidney disease, stage 3a: Secondary | ICD-10-CM | POA: Diagnosis not present

## 2021-11-19 DIAGNOSIS — F250 Schizoaffective disorder, bipolar type: Secondary | ICD-10-CM | POA: Diagnosis not present

## 2021-11-19 DIAGNOSIS — I152 Hypertension secondary to endocrine disorders: Secondary | ICD-10-CM

## 2021-11-19 DIAGNOSIS — K5909 Other constipation: Secondary | ICD-10-CM

## 2021-11-19 MED ORDER — LISINOPRIL 2.5 MG PO TABS: 2.5000 mg | ORAL_TABLET | Freq: Every day | ORAL | 1 refills | Status: DC

## 2021-11-19 MED ORDER — GABAPENTIN 300 MG PO CAPS: 300.0000 mg | ORAL_CAPSULE | Freq: Two times a day (BID) | ORAL | 1 refills | Status: DC

## 2021-11-19 MED ORDER — LUBIPROSTONE 8 MCG PO CAPS: 8.0000 ug | ORAL_CAPSULE | Freq: Two times a day (BID) | ORAL | 3 refills | Status: DC

## 2021-11-19 MED ORDER — PANTOPRAZOLE SODIUM 40 MG PO TBEC: 40.0000 mg | DELAYED_RELEASE_TABLET | Freq: Every day | ORAL | 1 refills | Status: DC

## 2021-11-19 MED ORDER — ATORVASTATIN CALCIUM 10 MG PO TABS: 10.0000 mg | ORAL_TABLET | Freq: Every day | ORAL | 1 refills | Status: DC

## 2021-11-19 NOTE — Progress Notes (Incomplete)
Subjective:  Patient ID: Jonathan Meckel., male    DOB: 10-Mar-1966  Age: 56 y.o. MRN: [id number]  CC: Neck Pain   HPI Jonathan Bufano. is a 56 y.o. 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 His arms sometimes go numb and he has pain shooting down from his neck to his arms. CT cervical spine from 12/2019 revealed: IMPRESSION: 1. No acute fracture or subluxation. 2. Stable moderate to marked severity multilevel degenerative changes.  He has pain  in his right hip and has to use his hands to move his right lower extremity as he has numbness down the whole right lower extremity. Symptoms have been present for 3 months.. He had a bilateral THA in June 20, 2013 and 2015/06/21.  Urology appointment for management of his BPH comes up in a few months.  He remains on tadalafil and is also on testosterone replacement therapy.  Diabetes is managed by endocrine with his last visit in 08/2021.  Past Medical History:  Diagnosis Date  . ADD (attention deficit disorder)   . Anxiety   . Arthritis    right hip  . Bipolar 1 disorder ([place])   . Blood in urine   . CKD (chronic kidney disease), stage III ([place])   . Creatinine elevation   . Dementia ([place])    "early onset" (08/04/2017)  . Depression    bipolar [facility]  . Diabetes mellitus without complication ([place])   . Family history of anesthesia complication    pt is unsure , but pt father may have been difficult to arouse   . HCAP (healthcare-associated pneumonia) 10/31/2012  . History of kidney stones   . Hypertension   . Hypogonadism male   . Liver fatty degeneration   . Microscopic hematuria    hereditary s/p Urology eval  . Neuromuscular disorder ([place])    feet neuropathy   .  Osteoarthritis of right hip 11/28/2011   2012 2015 s/p THR Severe  Dr Novella Olive    . Pleural effusion 11/02/2012  . Pneumonia 10-2012  . Pneumonia, organism unspecified(486) 11/02/2012  . Polysubstance dependence, non-opioid, in remission ([place])    remote  . Primary osteoarthritis of left hip 05/22/2015  . PTSD (post-traumatic stress disorder)    SOCIAL ANXIETY DISORDER   . Schizoaffective disorder ([place])   . Substance abuse ([place])   . Suicide attempt by multiple drug overdose ([place]) 2016-01-01   Grieving his cat's death June 21, 2015    Past Surgical History:  Procedure Laterality Date  . BACK SURGERY    . CLOSED REDUCTION METACARPAL WITH PERCUTANEOUS PINNING Right   . LUMBAR DISC SURGERY    . TONSILLECTOMY    . TOTAL HIP ARTHROPLASTY Right 08/16/2013   Procedure: TOTAL HIP ARTHROPLASTY ANTERIOR APPROACH;  Surgeon: Hessie Dibble, MD;  Location: [facility];  Service: Orthopedics;  Laterality: Right;  . TOTAL HIP ARTHROPLASTY Left 05/22/2015   Procedure: TOTAL HIP ARTHROPLASTY ANTERIOR APPROACH;  Surgeon: Melrose Nakayama, MD;  Location: [facility];  Service: Orthopedics;  Laterality: Left;    Family History  Problem Relation Age of Onset  . Diabetes Father   . Cancer Mother        died of melanoma with mets  . Cervical cancer Sister   . Diabetes Sister   .  Other Neg Hx        hypogonadism  . Colon cancer Neg Hx   . Colon polyps Neg Hx   . Esophageal cancer Neg Hx   . Rectal cancer Neg Hx   . Stomach cancer Neg Hx     Social History   Socioeconomic History  . Marital status: Single    Spouse name: Not on file  . Number of children: 0  . Years of education: Not on file  . Highest education level: Some college, no degree  Occupational History  . Occupation: disability  Tobacco Use  . Smoking status: Former    Years: 0.25    Types: Cigarettes    Quit date: 03/22/2019    Years since quitting: 2.6  . Smokeless tobacco: Never  Vaping Use  . Vaping Use: Never used  Substance and Sexual Activity   . Alcohol use: Not Currently  . Drug use: No    Comment: hx of marijuana/cocaine/crack use but sober since 20's  . Sexual activity: Not Currently  Other Topics Concern  . Not on file  Social History Narrative   05/08/2021 lives alone, sister [name] helps with meds, he has some in home care, lived with sister until Nov 2020   Caffeine- sodas, amount  varies   regular exercise-no   Social Determinants of Health   Financial Resource Strain: Not on file  Food Insecurity: Not on file  Transportation Needs: Not on file  Physical Activity: Not on file  Stress: Not on file  Social Connections: Not on file    Allergies  Allergen Reactions  . Vicodin [Hydrocodone-Acetaminophen] Itching    Outpatient Medications Prior to Visit  Medication Sig Dispense Refill  . alfuzosin (UROXATRAL) 10 MG 24 hr tablet Take 10 mg by mouth at bedtime.     . ARIPiprazole (ABILIFY) 5 MG tablet TAKE 1 TABLET BY MOUTH DAILY FOR DEPRESSION (Patient taking differently: Take 5 mg by mouth daily.) 30 tablet 0  . buPROPion (WELLBUTRIN XL) 300 MG 24 hr tablet Take 1 tablet (300 mg total) by mouth daily. 30 tablet 0  . busPIRone (BUSPAR) 10 MG tablet Take 10 mg by mouth 2 (two) times daily.    . busPIRone (BUSPAR) 15 MG tablet Take 15 mg by mouth 2 (two) times daily.    . chlorhexidine (PERIDEX) 0.12 % solution SMARTSIG:By Mouth    . ciclopirox (PENLAC) 8 % solution APPLY DAILY TO NAILS. ONCE  A WEEK FILE TOENAIL AND  CLEAN WITH ALCOHOL AND  REAPPLY 13.2 mL 0  . clonazePAM (KLONOPIN) 0.5 MG tablet Take 0.5 mg by mouth 2 (two) times daily as needed.    . Continuous Blood Gluc Receiver (FREESTYLE LIBRE READER) DEVI Use as directed 1 each each  . Continuous Blood Gluc Sensor (FREESTYLE LIBRE 14 DAY SENSOR) MISC Use as directed 2 each 4  . Continuous Blood Gluc Sensor (FREESTYLE LIBRE 2 SENSOR) MISC by Does not apply route.    . divalproex (DEPAKOTE ER) 500 MG 24 hr tablet Take 500 mg by mouth daily.    . fenofibrate  (TRICOR) 145 MG tablet TAKE 1 TABLET BY MOUTH DAILY 90 tablet 3  . FLUARIX QUADRIVALENT 0.5 ML injection     . fluticasone (FLONASE) 50 MCG/ACT nasal spray USE 1 SPRAY IN BOTH  NOSTRILS DAILY 32 g 1  . glucose blood (ACCU-CHEK GUIDE) test strip CHECK SUGAR FOUR TIMES DAILY 100 each 12  . insulin glargine (LANTUS SOLOSTAR) 100 UNIT/ML Solostar Pen Inject 45-50 Units  into the skin daily. 45 mL 3  . insulin lispro (HUMALOG KWIKPEN) 200 UNIT/ML KwikPen Inject 10-14 Units into the skin 3 (three) times daily before meals. 18 mL 3  . Insulin Pen Needle (B-D UF III MINI PEN NEEDLES) 31G X 5 MM MISC USE 4 TIMES DAILY 120 each 12  . mupirocin ointment (BACTROBAN) 2 % Apply 1 application topically 2 (two) times daily. 30 g 2  . naloxone (NARCAN) nasal spray 4 mg/0.1 mL SMARTSIG:Both Nares    . oxyCODONE-acetaminophen (PERCOCET/ROXICET) 5-325 MG tablet Take by mouth.    . polyethylene glycol (MIRALAX / GLYCOLAX) 17 g packet Take 17 g by mouth daily.    . QUEtiapine (SEROQUEL) 400 MG tablet Take 400 mg by mouth as needed.    . tadalafil (CIALIS) 5 MG tablet TAKE ONE TABLET BY MOUTH EVERY DAY FOR BPH 90 tablet 1  . Testosterone 20.25 MG/ACT (1.62%) GEL APPLY TOPICALLY 1 PUMP ONCE  DAILY TO THE UPPER ARMS 75 g 0  . tiZANidine (ZANAFLEX) 4 MG tablet TAKE 1 TABLET BY MOUTH EVERY 8  HOURS AS NEEDED FOR MUSCLE  SPASM(S) 90 tablet 1  . TRADJENTA 5 MG TABS tablet Take 5 mg by mouth daily.    . traZODone (DESYREL) 150 MG tablet Take 1 tablet (150 mg total) by mouth at bedtime. 90 tablet 1  . varenicline (CHANTIX) 1 MG tablet TAKE 1 TABLET BY MOUTH TWICE  DAILY 56 tablet 0  . atorvastatin (LIPITOR) 10 MG tablet TAKE 1 TABLET BY MOUTH AT  BEDTIME 90 tablet 3  . gabapentin (NEURONTIN) 300 MG capsule TAKE 1 CAPSULE BY MOUTH TWICE  DAILY 180 capsule 2  . Insulin NPH, Human,, Isophane, (HUMULIN N KWIKPEN) 100 UNIT/ML Kiwkpen Inject 45 Units into the skin every morning. And pen needles 1/day 45 mL 3  . lisinopril (ZESTRIL)  5 MG tablet TAKE 1 TABLET BY MOUTH  DAILY 90 tablet 3  . pantoprazole (PROTONIX) 40 MG tablet TAKE 1 TABLET BY MOUTH  DAILY 90 tablet 3   No facility-administered medications prior to visit.     ROS Review of Systems  Constitutional:  Negative for activity change and appetite change.  HENT:  Negative for sinus pressure and sore throat.   Respiratory:  Negative for chest tightness, shortness of breath and wheezing.   Cardiovascular:  Negative for chest pain and palpitations.  Gastrointestinal:  Positive for constipation. Negative for abdominal distention and abdominal pain.  Genitourinary: Negative.   Musculoskeletal:        See HPI  Psychiatric/Behavioral:  Negative for behavioral problems and dysphoric mood.     Objective:  BP 100/64   Pulse (!) 103   Ht 6' 3"  (1.905 m)   Wt 207 lb 6.4 oz (94.1 kg)   SpO2 96%   BMI 25.92 kg/m      11/19/2021    3:00 PM 09/02/2021   12:53 PM 08/28/2021   10:31 AM  BP/Weight  Systolic BP 161 096 045  Diastolic BP 64 78 76  Wt. (Lbs) 207.4 218 216.8  BMI 25.92 kg/m2 27.25 kg/m2 27.1 kg/m2      Physical Exam Constitutional:      Appearance: He is well-developed.  Neck:     Comments: Slight tenderness on lateral range of motion of neck Cardiovascular:     Rate and Rhythm: Tachycardia present.     Heart sounds: Normal heart sounds. No murmur heard. Pulmonary:     Effort: Pulmonary effort is normal.  Breath sounds: Normal breath sounds. No wheezing or rales.  Chest:     Chest wall: No tenderness.  Abdominal:     General: Bowel sounds are normal. There is no distension.     Palpations: Abdomen is soft. There is no mass.     Tenderness: There is no abdominal tenderness.  Musculoskeletal:        General: Normal range of motion.     Cervical back: Normal range of motion.     Right lower leg: No edema.     Left lower leg: No edema.  Neurological:     Mental Status: He is alert and oriented to person, place, and time.   Psychiatric:        Mood and Affect: Mood normal.        Latest Ref Rng & Units 06/16/2021    1:20 PM 05/27/2021    2:26 PM 02/06/2021    3:42 PM  CMP  Glucose 70 - 99 mg/dL 154  214  85   BUN 6 - 20 mg/dL 32  31  25   Creatinine 0.61 - 1.24 mg/dL 2.47  2.22  2.40   Sodium 135 - 145 mmol/L 131  136  140   Potassium 3.5 - 5.1 mmol/L 5.1  4.9  4.7   Chloride 98 - 111 mmol/L 95  97  102   CO2 22 - 32 mmol/L 24  25  25    Calcium 8.9 - 10.3 mg/dL 9.6  9.7  10.1   Total Protein 6.0 - 8.5 g/dL  7.8  8.0   Total Bilirubin 0.0 - 1.2 mg/dL  0.4  0.5   Alkaline Phos 44 - 121 IU/L  74  52   AST 0 - 40 IU/L  21  23   ALT 0 - 44 IU/L  14  15     Lipid Panel     Component Value Date/Time   CHOL 167 07/06/2020 1116   TRIG 323 (H) 07/06/2020 1116   HDL 39 (L) 07/06/2020 1116   CHOLHDL 4.3 07/06/2020 1116   CHOLHDL 7 06/02/2016 0844   VLDL 41 (H) 10/03/2015 0434   LDLCALC 76 07/06/2020 1116   LDLDIRECT 72.0 06/02/2016 0844    CBC    Component Value Date/Time   WBC 6.8 06/16/2021 1320   RBC 4.19 (L) 06/16/2021 1320   HGB 12.6 (L) 06/16/2021 1320   HGB 13.7 05/27/2021 1426   HCT 36.8 (L) 06/16/2021 1320   HCT 40.7 05/27/2021 1426   PLT 137 (L) 06/16/2021 1320   PLT 130 (L) 05/27/2021 1426   MCV 87.8 06/16/2021 1320   MCV 90 05/27/2021 1426   MCH 30.1 06/16/2021 1320   MCHC 34.2 06/16/2021 1320   RDW 12.2 06/16/2021 1320   RDW 13.8 05/27/2021 1426   LYMPHSABS 0.9 06/16/2021 1320   LYMPHSABS 1.7 05/27/2021 1426   MONOABS 0.7 06/16/2021 1320   EOSABS 0.0 06/16/2021 1320   EOSABS 0.1 05/27/2021 1426   BASOSABS 0.0 06/16/2021 1320   BASOSABS 0.0 05/27/2021 1426    Lab Results  Component Value Date   HGBA1C 9.4 (A) 09/02/2021    Assessment & Plan:  1. Type 2 diabetes mellitus with stage 4 chronic kidney disease, with long-term current use of insulin (HCC) Uncontrolled with A1c of 9.4 He remains on Lantus and Humalog Currently being managed by endocrine -advised to keep  appointment Counseled on Diabetic diet, my plate method, 295 minutes of moderate intensity exercise/week Blood sugar logs with fasting  goals of 80-120 mg/dl, random of less than 180 and in the event of sugars less than 60 mg/dl or greater than 400 mg/dl encouraged to notify the clinic. Advised on the need for annual eye exams, annual foot exams, Pneumonia vaccine. - CMP14+EGFR  2. Schizoaffective disorder, bipolar type ([place]) Continue Abilify as per psych   3. Osteoarthritis of cervical spine, unspecified spinal osteoarthritis complication status Uncontrolled He currently is under the care of pain management We will benefit from PT - Ambulatory referral to [facility]  4. Testosterone deficiency Currently on testosterone replacement Advised that he will need to discuss with urology regarding taking over prescriptions for his testosterone - CBC with Differential/Platelet - PSA, total and free  5. Hypertension associated with diabetes ([place]) Soft blood pressure Decrease lisinopril from 5 mg to 2.5 mg - lisinopril (ZESTRIL) 2.5 MG tablet; Take 1 tablet (2.5 mg total) by mouth daily.  Dispense: 90 tablet; Refill: 1  6. Type 2 diabetes mellitus with stage 3a chronic kidney disease, with long-term current use of insulin (HCC) *** - atorvastatin (LIPITOR) 10 MG tablet; Take 1 tablet (10 mg total) by mouth at bedtime.  Dispense: 90 tablet; Refill: 1  7. Degenerative disc disease, lumbar *** - gabapentin (NEURONTIN) 300 MG capsule; Take 1 capsule (300 mg total) by mouth 2 (two) times daily.  Dispense: 180 capsule; Refill: 1  8. Pain of right hip *** - Ambulatory referral to Home Health  9. Other constipation *** - lubiprostone (AMITIZA) 8 MCG capsule; Take 1 capsule (8 mcg total) by mouth 2 (two) times daily with a meal.  Dispense: 60 capsule; Refill: 3   Health Care Maintenance: *** Meds ordered this encounter  Medications  . lubiprostone (AMITIZA) 8 MCG capsule    Sig: Take 1  capsule (8 mcg total) by mouth 2 (two) times daily with a meal.    Dispense:  60 capsule    Refill:  3  . lisinopril (ZESTRIL) 2.5 MG tablet    Sig: Take 1 tablet (2.5 mg total) by mouth daily.    Dispense:  90 tablet    Refill:  1    Dose decreased  . atorvastatin (LIPITOR) 10 MG tablet    Sig: Take 1 tablet (10 mg total) by mouth at bedtime.    Dispense:  90 tablet    Refill:  1    Requesting 1 year supply  . gabapentin (NEURONTIN) 300 MG capsule    Sig: Take 1 capsule (300 mg total) by mouth 2 (two) times daily.    Dispense:  180 capsule    Refill:  1    Requesting 1 year supply  . pantoprazole (PROTONIX) 40 MG tablet    Sig: Take 1 tablet (40 mg total) by mouth daily.    Dispense:  90 tablet    Refill:  1    Requesting 1 year supply    Follow-up: Return in about 1 month (around 12/20/2021) for Medicare wellness exam.       [name], MD, FAAFP. [facility] and [place], [place]   11/19/2021, 5:25 PM

## 2021-11-19 NOTE — Patient Instructions (Signed)
Cervical Radiculopathy  Cervical radiculopathy means that a nerve in the neck (a cervical nerve) is pinched or bruised. This can happen because of an injury to the cervical spine (vertebrae) in the neck, or as a normal part of getting older. This condition can cause pain or loss of feeling (numbness) that runs from your neck all the way down to your arm and fingers. Often, this condition gets better with rest. Treatment may be needed if the condition does not get better. What are the causes? A neck injury. A bulging disk in your spine. Sudden muscle tightening (muscle spasms). Tight muscles in your neck due to overuse. Arthritis. Breakdown in the bones and joints of the spine (spondylosis) due to getting older. Bone spurs that form near the nerves in the neck. What are the signs or symptoms? Pain. The pain may: Run from the neck to the arm and hand. Be very bad or irritating. Get worse when you move your neck. Loss of feeling or tingling in your arm or hand. Weakness in your arm or hand, in very bad cases. How is this treated? In many cases, treatment is not needed for this condition. With rest, the condition often gets better over time. If treatment is needed, options may include: Wearing a soft neck collar (cervical collar) for short periods of time. Doing exercises (physical therapy) to strengthen your neck muscles. Taking medicines. Having shots (injections) in your spine, in very bad cases. Having surgery. This may be needed if other treatments do not help. The type of surgery that is used will depend on the cause of your condition. Follow these instructions at home: If you have a soft neck collar: Wear it as told by your doctor. Take it off only as told by your doctor. Ask your doctor if you can take the collar off for cleaning and bathing. If you are allowed to take the collar off for cleaning or bathing: Follow instructions from your doctor about how to take off the collar  safely. Clean the collar by wiping it with mild soap and water and drying it completely. Take out any removable pads in the collar every 1-2 days. Wash them by hand with soap and water. Let them air-dry completely before you put them back in the collar. Check your skin under the collar for redness or sores. If you see any, tell your doctor. Managing pain     Take over-the-counter and prescription medicines only as told by your doctor. If told, put ice on the painful area. To do this: If you have a soft neck collar, take if off as told by your doctor. Put ice in a plastic bag. Place a towel between your skin and the bag. Leave the ice on for 20 minutes, 2-3 times a day. Take off the ice if your skin turns bright red. This is very important. If you cannot feel pain, heat, or cold, you have a greater risk of damage to the area. If using ice does not help, you can try using heat. Use the heat source that your doctor recommends, such as a moist heat pack or a heating pad. Place a towel between your skin and the heat source. Leave the heat on for 20-30 minutes. Take off the heat if your skin turns bright red. This is very important. If you cannot feel pain, heat, or cold, you have a greater risk of getting burned. You may try a gentle neck and shoulder rub (massage). Activity Rest as needed. Return   to your normal activities when your doctor says that it is safe. Do exercises as told by your doctor or physical therapist. You may have to avoid lifting. Ask your doctor how much you can safely lift. General instructions Use a flat pillow when you sleep. Do not drive while wearing a soft neck collar. If you do not have a soft neck collar, ask your doctor if it is safe to drive while your neck heals. Ask your doctor if you should avoid driving or using machines while you are taking your medicine. Do not smoke or use any products that contain nicotine or tobacco. If you need help quitting, ask your  doctor. Keep all follow-up visits. Contact a doctor if: Your condition does not get better with treatment. Get help right away if: Your pain gets worse and medicine does not help. You lose feeling or feel weak in your hand, arm, face, or leg. You have a high fever. Your neck is stiff. You cannot control when you poop or pee (have incontinence). You have trouble with walking, balance, or talking. Summary Cervical radiculopathy means that a nerve in the neck is pinched or bruised. A nerve can get pinched from a bulging disk, arthritis, an injury to the neck, or other causes. Symptoms include pain, tingling, or loss of feeling that goes from the neck to the arm or hand. Weakness in your arm or hand can happen in very bad cases. Treatment may include resting, wearing a soft neck collar, and doing exercises. You might need to take medicines for pain. In very bad cases, shots or surgery may be needed. This information is not intended to replace advice given to you by your health care provider. Make sure you discuss any questions you have with your health care provider. Document Revised: 10/11/2020 Document Reviewed: 10/11/2020 Elsevier Patient Education  2023 [employer]  

## 2021-11-20 ENCOUNTER — Telehealth: Payer: Self-pay

## 2021-11-20 ENCOUNTER — Other Ambulatory Visit: Payer: Self-pay | Admitting: Family Medicine

## 2021-11-20 ENCOUNTER — Encounter: Payer: Self-pay | Admitting: Family Medicine

## 2021-11-20 DIAGNOSIS — E349 Endocrine disorder, unspecified: Secondary | ICD-10-CM

## 2021-11-20 LAB — CBC WITH DIFFERENTIAL/PLATELET
Basophils Absolute: 0 10*3/uL (ref 0.0–0.2)
Basos: 1 %
EOS (ABSOLUTE): 0 10*3/uL (ref 0.0–0.4)
Eos: 1 %
Hematocrit: 40.5 % (ref 37.5–51.0)
Hemoglobin: 14.1 g/dL (ref 13.0–17.7)
Immature Grans (Abs): 0 10*3/uL (ref 0.0–0.1)
Immature Granulocytes: 1 %
Lymphocytes Absolute: 1.7 10*3/uL (ref 0.7–3.1)
Lymphs: 37 %
MCH: 29.2 pg (ref 26.6–33.0)
MCHC: 34.8 g/dL (ref 31.5–35.7)
MCV: 84 fL (ref 79–97)
Monocytes Absolute: 0.5 10*3/uL (ref 0.1–0.9)
Monocytes: 11 %
Neutrophils Absolute: 2.4 10*3/uL (ref 1.4–7.0)
Neutrophils: 49 %
Platelets: 140 10*3/uL — ABNORMAL LOW (ref 150–450)
RBC: 4.83 x10E6/uL (ref 4.14–5.80)
RDW: 13.3 % (ref 11.6–15.4)
WBC: 4.7 10*3/uL (ref 3.4–10.8)

## 2021-11-20 LAB — PSA, TOTAL AND FREE
PSA, Free Pct: 45.6 %
PSA, Free: 0.82 ng/mL
Prostate Specific Ag, Serum: 1.8 ng/mL (ref 0.0–4.0)

## 2021-11-20 LAB — CMP14+EGFR
ALT: 24 IU/L (ref 0–44)
AST: 28 IU/L (ref 0–40)
Albumin/Globulin Ratio: 1.7 (ref 1.2–2.2)
Albumin: 4.8 g/dL (ref 3.8–4.9)
Alkaline Phosphatase: 49 IU/L (ref 44–121)
BUN/Creatinine Ratio: 13 (ref 9–20)
BUN: 33 mg/dL — ABNORMAL HIGH (ref 6–24)
Bilirubin Total: 0.4 mg/dL (ref 0.0–1.2)
CO2: 24 mmol/L (ref 20–29)
Calcium: 10 mg/dL (ref 8.7–10.2)
Chloride: 94 mmol/L — ABNORMAL LOW (ref 96–106)
Creatinine, Ser: 2.55 mg/dL — ABNORMAL HIGH (ref 0.76–1.27)
Globulin, Total: 2.9 g/dL (ref 1.5–4.5)
Glucose: 112 mg/dL — ABNORMAL HIGH (ref 70–99)
Potassium: 4.7 mmol/L (ref 3.5–5.2)
Sodium: 135 mmol/L (ref 134–144)
Total Protein: 7.7 g/dL (ref 6.0–8.5)
eGFR: 29 mL/min/{1.73_m2} — ABNORMAL LOW (ref >59)

## 2021-11-20 NOTE — Telephone Encounter (Signed)
Referral received for home PT.  I called the patient to inquire if he has a preference for home health agencies.  He said he received services from [facility] in the past and was not pleased with the services they provided and did not want to be re-referred there. He was in agreement to referring to other agencies, no preference.   I explained to him that I will contact other agencies but there is no guarantee that another agency will accept the referral - they need to be in network with his insurance and have adequate staffing.he said he understood.   Referral then faxed to [facility] for review.

## 2021-11-20 NOTE — Progress Notes (Signed)
Subjective:  Patient ID: Jonathan Meckel., male    DOB: 12/30/1965  Age: 56 y.o. MRN: [id number]  CC: Neck Pain   HPI Jonathan Cueva. is a 56 y.o. 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 His arms sometimes go numb and he has pain shooting down from his neck to his arms. CT cervical spine from 12/2019 revealed: IMPRESSION: 1. No acute fracture or subluxation. 2. Stable moderate to marked severity multilevel degenerative changes.  He has pain  in his right hip and has to use his hands to move his right lower extremity as he has numbness down the whole right lower extremity. Symptoms have been present for 3 months.. He had a bilateral THA in 11-Jul-2013 and Jul 12, 2015.  Urology appointment for management of his BPH comes up in a few months.  He remains on tadalafil and is also on testosterone replacement therapy.  Diabetes is managed by endocrine with his last visit in 08/2021.  Past Medical History:  Diagnosis Date   ADD (attention deficit disorder)    Anxiety    Arthritis    right hip   Bipolar 1 disorder (HCC)    Blood in urine    CKD (chronic kidney disease), stage III (HCC)    Creatinine elevation    Dementia ([place])    "early onset" (08/04/2017)   Depression    bipolar [facility]   Diabetes mellitus without complication ([place])    Family history of anesthesia complication    pt is unsure , but pt father may have been difficult to arouse    HCAP (healthcare-associated pneumonia) 10/31/2012   History of kidney stones    Hypertension    Hypogonadism male    Liver fatty degeneration    Microscopic hematuria    hereditary s/p Urology eval   Neuromuscular disorder ([place])    feet neuropathy    Osteoarthritis of  right hip 11/28/2011   2012 2015 s/p THR Severe  Dr Novella Olive     Pleural effusion 11/02/2012   Pneumonia 10-2012   Pneumonia, organism unspecified(486) 11/02/2012   Polysubstance dependence, non-opioid, in remission ([place])    remote   Primary osteoarthritis of left hip 05/22/2015   PTSD (post-traumatic stress disorder)    SOCIAL ANXIETY DISORDER    Schizoaffective disorder ([place])    Substance abuse ([place])    Suicide attempt by multiple drug overdose ([place]) 22-Jan-2016   Grieving his cat's death 07-12-2015    Past Surgical History:  Procedure Laterality Date   BACK SURGERY     CLOSED REDUCTION METACARPAL WITH PERCUTANEOUS PINNING Right    LUMBAR [place] HIP ARTHROPLASTY Right 08/16/2013   Procedure: TOTAL HIP ARTHROPLASTY ANTERIOR APPROACH;  Surgeon: Hessie Dibble, MD;  Location: [facility];  Service: Orthopedics;  Laterality: Right;   TOTAL HIP ARTHROPLASTY Left 05/22/2015   Procedure: TOTAL HIP ARTHROPLASTY ANTERIOR APPROACH;  Surgeon: Melrose Nakayama, MD;  Location: [facility];  Service: Orthopedics;  Laterality: Left;    Family History  Problem Relation Age of Onset   Diabetes Father    Cancer Mother        died of melanoma with mets   Cervical cancer Sister    Diabetes Sister  Other Neg Hx        hypogonadism   Colon cancer Neg Hx    Colon polyps Neg Hx    Esophageal cancer Neg Hx    Rectal cancer Neg Hx    Stomach cancer Neg Hx     Social History   Socioeconomic History   Marital status: Single    Spouse name: Not on file   Number of children: 0   Years of education: Not on file   Highest education level: Some college, no degree  Occupational History   Occupation: disability  Tobacco Use   Smoking status: Former    Years: 0.25    Types: Cigarettes    Quit date: 03/22/2019    Years since quitting: 2.6   Smokeless tobacco: Never  Vaping Use   Vaping Use: Never used  Substance and Sexual Activity   Alcohol use: Not Currently   Drug use: No     Comment: hx of marijuana/cocaine/crack use but sober since 20's   Sexual activity: Not Currently  Other Topics Concern   Not on file  Social History Narrative   05/08/2021 lives alone, sister [name] helps with meds, he has some in home care, lived with sister until Nov 2020   Caffeine- sodas, amount  varies   regular exercise-no   Social Determinants of Health   Financial Resource Strain: Not on file  Food Insecurity: Not on file  Transportation Needs: Not on file  Physical Activity: Not on file  Stress: Not on file  Social Connections: Not on file    Allergies  Allergen Reactions   Vicodin [Hydrocodone-Acetaminophen] Itching    Outpatient Medications Prior to Visit  Medication Sig Dispense Refill   alfuzosin (UROXATRAL) 10 MG 24 hr tablet Take 10 mg by mouth at bedtime.      ARIPiprazole (ABILIFY) 5 MG tablet TAKE 1 TABLET BY MOUTH DAILY FOR DEPRESSION (Patient taking differently: Take 5 mg by mouth daily.) 30 tablet 0   buPROPion (WELLBUTRIN XL) 300 MG 24 hr tablet Take 1 tablet (300 mg total) by mouth daily. 30 tablet 0   busPIRone (BUSPAR) 10 MG tablet Take 10 mg by mouth 2 (two) times daily.     busPIRone (BUSPAR) 15 MG tablet Take 15 mg by mouth 2 (two) times daily.     chlorhexidine (PERIDEX) 0.12 % solution SMARTSIG:By Mouth     ciclopirox (PENLAC) 8 % solution APPLY DAILY TO NAILS. ONCE  A WEEK FILE TOENAIL AND  CLEAN WITH ALCOHOL AND  REAPPLY 13.2 mL 0   clonazePAM (KLONOPIN) 0.5 MG tablet Take 0.5 mg by mouth 2 (two) times daily as needed.     Continuous Blood Gluc Receiver (FREESTYLE LIBRE READER) DEVI Use as directed 1 each each   Continuous Blood Gluc Sensor (FREESTYLE LIBRE 14 DAY SENSOR) MISC Use as directed 2 each 4   Continuous Blood Gluc Sensor (FREESTYLE LIBRE 2 SENSOR) MISC by Does not apply route.     divalproex (DEPAKOTE ER) 500 MG 24 hr tablet Take 500 mg by mouth daily.     fenofibrate (TRICOR) 145 MG tablet TAKE 1 TABLET BY MOUTH DAILY 90 tablet 3    FLUARIX QUADRIVALENT 0.5 ML injection      fluticasone (FLONASE) 50 MCG/ACT nasal spray USE 1 SPRAY IN BOTH  NOSTRILS DAILY 32 g 1   glucose blood (ACCU-CHEK GUIDE) test strip CHECK SUGAR FOUR TIMES DAILY 100 each 12   insulin glargine (LANTUS SOLOSTAR) 100 UNIT/ML Solostar Pen Inject 45-50 Units  into the skin daily. 45 mL 3   insulin lispro (HUMALOG KWIKPEN) 200 UNIT/ML KwikPen Inject 10-14 Units into the skin 3 (three) times daily before meals. 18 mL 3   Insulin Pen Needle (B-D UF III MINI PEN NEEDLES) 31G X 5 MM MISC USE 4 TIMES DAILY 120 each 12   mupirocin ointment (BACTROBAN) 2 % Apply 1 application topically 2 (two) times daily. 30 g 2   naloxone (NARCAN) nasal spray 4 mg/0.1 mL SMARTSIG:Both Nares     oxyCODONE-acetaminophen (PERCOCET/ROXICET) 5-325 MG tablet Take by mouth.     polyethylene glycol (MIRALAX / GLYCOLAX) 17 g packet Take 17 g by mouth daily.     QUEtiapine (SEROQUEL) 400 MG tablet Take 400 mg by mouth as needed.     tadalafil (CIALIS) 5 MG tablet TAKE ONE TABLET BY MOUTH EVERY DAY FOR BPH 90 tablet 1   Testosterone 20.25 MG/ACT (1.62%) GEL APPLY TOPICALLY 1 PUMP ONCE  DAILY TO THE UPPER ARMS 75 g 0   tiZANidine (ZANAFLEX) 4 MG tablet TAKE 1 TABLET BY MOUTH EVERY 8  HOURS AS NEEDED FOR MUSCLE  SPASM(S) 90 tablet 1   TRADJENTA 5 MG TABS tablet Take 5 mg by mouth daily.     traZODone (DESYREL) 150 MG tablet Take 1 tablet (150 mg total) by mouth at bedtime. 90 tablet 1   varenicline (CHANTIX) 1 MG tablet TAKE 1 TABLET BY MOUTH TWICE  DAILY 56 tablet 0   atorvastatin (LIPITOR) 10 MG tablet TAKE 1 TABLET BY MOUTH AT  BEDTIME 90 tablet 3   gabapentin (NEURONTIN) 300 MG capsule TAKE 1 CAPSULE BY MOUTH TWICE  DAILY 180 capsule 2   Insulin NPH, Human,, Isophane, (HUMULIN N KWIKPEN) 100 UNIT/ML Kiwkpen Inject 45 Units into the skin every morning. And pen needles 1/day 45 mL 3   lisinopril (ZESTRIL) 5 MG tablet TAKE 1 TABLET BY MOUTH  DAILY 90 tablet 3   pantoprazole (PROTONIX) 40 MG  tablet TAKE 1 TABLET BY MOUTH  DAILY 90 tablet 3   No facility-administered medications prior to visit.     ROS Review of Systems  Constitutional:  Negative for activity change and appetite change.  HENT:  Negative for sinus pressure and sore throat.   Respiratory:  Negative for chest tightness, shortness of breath and wheezing.   Cardiovascular:  Negative for chest pain and palpitations.  Gastrointestinal:  Positive for constipation. Negative for abdominal distention and abdominal pain.  Genitourinary: Negative.   Musculoskeletal:        See HPI  Psychiatric/Behavioral:  Negative for behavioral problems and dysphoric mood.     Objective:  BP 100/64   Pulse (!) 103   Ht 6' 3"  (1.905 m)   Wt 207 lb 6.4 oz (94.1 kg)   SpO2 96%   BMI 25.92 kg/m      11/19/2021    3:00 PM 09/02/2021   12:53 PM 08/28/2021   10:31 AM  BP/Weight  Systolic BP 160 109 323  Diastolic BP 64 78 76  Wt. (Lbs) 207.4 218 216.8  BMI 25.92 kg/m2 27.25 kg/m2 27.1 kg/m2      Physical Exam Constitutional:      Appearance: He is well-developed.  Neck:     Comments: Slight tenderness on lateral range of motion of neck Cardiovascular:     Rate and Rhythm: Tachycardia present.     Heart sounds: Normal heart sounds. No murmur heard. Pulmonary:     Effort: Pulmonary effort is normal.  Breath sounds: Normal breath sounds. No wheezing or rales.  Chest:     Chest wall: No tenderness.  Abdominal:     General: Bowel sounds are normal. There is no distension.     Palpations: Abdomen is soft. There is no mass.     Tenderness: There is no abdominal tenderness.  Musculoskeletal:        General: Normal range of motion.     Cervical back: Normal range of motion.  Lumbar spine: Slight decrease in flexion and extension with associated tenderness    Right lower leg: No edema.     Left lower leg: No edema.  Neurological:     Mental Status: He is alert and oriented to person, place, and time.  Abnormal  gait Psychiatric:        Mood and Affect: Mood normal.        Latest Ref Rng & Units 11/19/2021    4:40 PM 06/16/2021    1:20 PM 05/27/2021    2:26 PM  CMP  Glucose 70 - 99 mg/dL 112  154  214   BUN 6 - 24 mg/dL 33  32  31   Creatinine 0.76 - 1.27 mg/dL 2.55  2.47  2.22   Sodium 134 - 144 mmol/L 135  131  136   Potassium 3.5 - 5.2 mmol/L 4.7  5.1  4.9   Chloride 96 - 106 mmol/L 94  95  97   CO2 20 - 29 mmol/L 24  24  25    Calcium 8.7 - 10.2 mg/dL 10.0  9.6  9.7   Total Protein 6.0 - 8.5 g/dL 7.7   7.8   Total Bilirubin 0.0 - 1.2 mg/dL 0.4   0.4   Alkaline Phos 44 - 121 IU/L 49   74   AST 0 - 40 IU/L 28   21   ALT 0 - 44 IU/L 24   14     Lipid Panel     Component Value Date/Time   CHOL 167 07/06/2020 1116   TRIG 323 (H) 07/06/2020 1116   HDL 39 (L) 07/06/2020 1116   CHOLHDL 4.3 07/06/2020 1116   CHOLHDL 7 06/02/2016 0844   VLDL 41 (H) 10/03/2015 0434   LDLCALC 76 07/06/2020 1116   LDLDIRECT 72.0 06/02/2016 0844    CBC    Component Value Date/Time   WBC 4.7 11/19/2021 1640   WBC 6.8 06/16/2021 1320   RBC 4.83 11/19/2021 1640   RBC 4.19 (L) 06/16/2021 1320   HGB 14.1 11/19/2021 1640   HCT 40.5 11/19/2021 1640   PLT 140 (L) 11/19/2021 1640   MCV 84 11/19/2021 1640   MCH 29.2 11/19/2021 1640   MCH 30.1 06/16/2021 1320   MCHC 34.8 11/19/2021 1640   MCHC 34.2 06/16/2021 1320   RDW 13.3 11/19/2021 1640   LYMPHSABS 1.7 11/19/2021 1640   MONOABS 0.7 06/16/2021 1320   EOSABS 0.0 11/19/2021 1640   BASOSABS 0.0 11/19/2021 1640    Lab Results  Component Value Date   HGBA1C 9.4 (A) 09/02/2021    Assessment & Plan:  1. Type 2 diabetes mellitus with stage 4 chronic kidney disease, with long-term current use of insulin (HCC) Uncontrolled with A1c of 9.4 He remains on Lantus and Humalog Currently being managed by endocrine -advised to keep appointment Counseled on Diabetic diet, my plate method, 650 minutes of moderate intensity exercise/week Blood sugar logs with  fasting goals of 80-120 mg/dl, random of less than 180 and in the event of  sugars less than 60 mg/dl or greater than 400 mg/dl encouraged to notify the clinic. Advised on the need for annual eye exams, annual foot exams, Pneumonia vaccine. - CMP14+EGFR  2. Schizoaffective disorder, bipolar type ([place]) Continue Abilify as per psych   3. Osteoarthritis of cervical spine, unspecified spinal osteoarthritis complication status Uncontrolled He currently is under the care of pain management We will benefit from PT - Ambulatory referral to [facility]  4. Testosterone deficiency Currently on testosterone replacement Advised that he will need to discuss with urology regarding taking over prescriptions for his testosterone - CBC with Differential/Platelet - PSA, total and free  5. Hypertension associated with diabetes ([place]) Soft blood pressure Decrease lisinopril from 5 mg to 2.5 mg - lisinopril (ZESTRIL) 2.5 MG tablet; Take 1 tablet (2.5 mg total) by mouth daily.  Dispense: 90 tablet; Refill: 1  6. Type 2 diabetes mellitus with stage 3a chronic kidney disease, with long-term current use of insulin ([place]) Diabetic nephropathy Continue to follow-up with [facility] - atorvastatin (LIPITOR) 10 MG tablet; Take 1 tablet (10 mg total) by mouth at bedtime.  Dispense: 90 tablet; Refill: 1  7. Degenerative disc disease, lumbar Stable with no flares Currently under pain management - gabapentin (NEURONTIN) 300 MG capsule; Take 1 capsule (300 mg total) by mouth 2 (two) times daily.  Dispense: 180 capsule; Refill: 1  8. Pain of right hip History of total hip arthroplasty He will benefit from PT - Ambulatory referral to [facility]  9. Other constipation Uncontrolled on MiraLAX Opioid-induced constipation - lubiprostone (AMITIZA) 8 MCG capsule; Take 1 capsule (8 mcg total) by mouth 2 (two) times daily with a meal.  Dispense: 60 capsule; Refill: 3   Meds ordered this encounter  Medications    lubiprostone (AMITIZA) 8 MCG capsule    Sig: Take 1 capsule (8 mcg total) by mouth 2 (two) times daily with a meal.    Dispense:  60 capsule    Refill:  3   lisinopril (ZESTRIL) 2.5 MG tablet    Sig: Take 1 tablet (2.5 mg total) by mouth daily.    Dispense:  90 tablet    Refill:  1    Dose decreased   atorvastatin (LIPITOR) 10 MG tablet    Sig: Take 1 tablet (10 mg total) by mouth at bedtime.    Dispense:  90 tablet    Refill:  1    Requesting 1 year supply   gabapentin (NEURONTIN) 300 MG capsule    Sig: Take 1 capsule (300 mg total) by mouth 2 (two) times daily.    Dispense:  180 capsule    Refill:  1    Requesting 1 year supply   pantoprazole (PROTONIX) 40 MG tablet    Sig: Take 1 tablet (40 mg total) by mouth daily.    Dispense:  90 tablet    Refill:  1    Requesting 1 year supply    Follow-up: Return in about 1 month (around 12/20/2021) for Medicare wellness exam.    Visit required 48 minutes of patient care including median intraservice time reviewing previous notes and test results, counseling patient on diagnosis and work up of neck pain in addition to management of chronic medical conditions.Time also spent ordering medications, investigations and documenting in the chart.  All questions were answered to the patient's satisfaction    [name], MD, FAAFP. [facility] [place], [place]   11/20/2021, 11:30 AM

## 2021-11-21 ENCOUNTER — Telehealth: Payer: Self-pay | Admitting: Emergency Medicine

## 2021-11-21 ENCOUNTER — Other Ambulatory Visit: Payer: Self-pay | Admitting: Family Medicine

## 2021-11-21 DIAGNOSIS — M51369 Other intervertebral disc degeneration, lumbar region without mention of lumbar back pain or lower extremity pain: Secondary | ICD-10-CM

## 2021-11-21 DIAGNOSIS — J302 Other seasonal allergic rhinitis: Secondary | ICD-10-CM

## 2021-11-21 DIAGNOSIS — M5136 Other intervertebral disc degeneration, lumbar region: Secondary | ICD-10-CM

## 2021-11-21 DIAGNOSIS — E349 Endocrine disorder, unspecified: Secondary | ICD-10-CM

## 2021-11-21 NOTE — Telephone Encounter (Signed)
Requested Prescriptions  Pending Prescriptions Disp Refills  . pantoprazole (PROTONIX) 40 MG tablet [Pharmacy Med Name: Pantoprazole Sodium 40 MG Oral Tablet Delayed Release] 100 tablet 2    Sig: TAKE 1 TABLET BY MOUTH DAILY     Gastroenterology: Proton Pump Inhibitors Passed - 11/21/2021 10:51 AM      Passed - Valid encounter within last 12 months    Recent Outpatient Visits          2 days ago Type 2 diabetes mellitus with stage 4 chronic kidney disease, with long-term current use of insulin ([place])   [place], Charlane Ferretti, MD   2 months ago Type 2 diabetes mellitus with stage 4 chronic kidney disease, with long-term current use of insulin ([place])   [place], Lisman, MD   3 months ago Postural drop in blood pressure   [place], [place] W, NP   5 months ago Type 2 diabetes mellitus with stage 4 chronic kidney disease, with long-term current use of insulin ([place])   [place], Charlane Ferretti, MD   9 months ago Type 2 diabetes mellitus with stage 4 chronic kidney disease, with long-term current use of insulin ([place])   [place]once, Angela M, [place]             . fluticasone (FLONASE) 50 MCG/ACT nasal spray [Pharmacy Med Name: Fluticasone Propionate 50 MCG/ACT Nasal Suspension] 32 g 1    Sig: USE 1 SPRAY IN BOTH NOSTRILS  DAILY     Ear, Nose, and Throat: Nasal Preparations - Corticosteroids Passed - 11/21/2021 10:51 AM      Passed - Valid encounter within last 12 months    Recent Outpatient Visits          2 days ago Type 2 diabetes mellitus with stage 4 chronic kidney disease, with long-term current use of insulin ([place])   [place], [place], MD   2 months ago Type 2 diabetes mellitus with stage 4 chronic kidney disease, with long-term current use of insulin ([place])   [place], [place], MD   3 months ago Postural drop in blood pressure   [place], [place] W, NP   5 months ago Type 2 diabetes mellitus with stage 4 chronic kidney disease, with long-term current use of insulin ([place])   [place], [place], MD   9 months ago Type 2 diabetes mellitus with stage 4 chronic kidney disease, with long-term current use of insulin ([place])   [place], [place] M, [place]             . varenicline (CHANTIX) 1 MG tablet [Pharmacy Med Name: VARENICLINE  '1MG'$   TAB] 56 tablet 0    Sig: TAKE 1 TABLET BY MOUTH TWICE  DAILY     Psychiatry:  Drug Dependence Therapy - varenicline Failed - 11/21/2021 10:51 AM      Failed - Manual Review: Do not refill starter pack. 1 mg tabs may be extended up to one year if the patient has quit smoking but still feels at risk for relapse.      Failed - Cr in normal range and within 180 days    Creatinine, Ser  Date Value Ref Range Status  11/19/2021 2.55 (H) 0.76 -  1.27 mg/dL Final   Creatinine,U  Date Value Ref Range Status  04/18/2015 250.2 mg/dL Final   Creatinine, Urine  Date Value Ref Range Status  10/03/2015 35.87 mg/dL Final         Passed - Completed PHQ-2 or PHQ-9 in the last 360 days      Passed - Valid encounter within last 6 months    Recent Outpatient Visits          2 days ago Type 2 diabetes mellitus with stage 4 chronic kidney disease, with long-term current use of insulin ([place])   [place], [place], MD   2 months ago Type 2 diabetes mellitus with stage 4 chronic kidney disease, with long-term current use of insulin ([place])   [place], [place], MD   3 months ago Postural drop in blood pressure   [place], [place] W, NP   5 months ago Type 2 diabetes  mellitus with stage 4 chronic kidney disease, with long-term current use of insulin ([place])   [place], [place], MD   9 months ago Type 2 diabetes mellitus with stage 4 chronic kidney disease, with long-term current use of insulin [facility])   [place], [place], [place]             . Testosterone 20.25 MG/ACT (1.62%) GEL [Pharmacy Med Name: TESTOSTERONE GEL PUMP 1.62% (ANDROGEL)] 75 g     Sig: APPLY TOPICALLY 1 PUMP ONCE  DAILY TO THE UPPER ARMS     Off-Protocol Failed - 11/21/2021 10:51 AM      Failed - Medication not assigned to a protocol, review manually.      Passed - Valid encounter within last 12 months    Recent Outpatient Visits          2 days ago Type 2 diabetes mellitus with stage 4 chronic kidney disease, with long-term current use of insulin ([place])   [place], [place], MD   2 months ago Type 2 diabetes mellitus with stage 4 chronic kidney disease, with long-term current use of insulin ([place])   [place], [place], MD   3 months ago Postural drop in blood pressure   [place], [place] W, NP   5 months ago Type 2 diabetes mellitus with stage 4 chronic kidney disease, with long-term current use of insulin ([place])   [place], Charlane Ferretti, MD   9 months ago Type 2 diabetes mellitus with stage 4 chronic kidney disease, with long-term current use of insulin ([place])   [place], Angela M, [place]             . tiZANidine (ZANAFLEX) 4 MG tablet [Pharmacy Med Name: tiZANidine HCl 4 MG Oral Tablet] 90 tablet 1    Sig: TAKE 1 TABLET BY MOUTH EVERY 8  HOURS AS NEEDED FOR MUSCLE  SPASM(S)     Not Delegated - Cardiovascular:  Alpha-2 Agonists - tizanidine Failed - 11/21/2021 10:51 AM      Failed - This refill cannot be delegated       Passed - Valid encounter within last 6 months    Recent Outpatient Visits          2 days ago Type 2 diabetes mellitus with stage  4 chronic kidney disease, with long-term current use of insulin ([place])   [place], [place], MD   2 months ago Type 2 diabetes mellitus with stage 4 chronic kidney disease, with long-term current use of insulin ([place])   [place], [place], MD   3 months ago Postural drop in blood pressure   [place], [place] W, NP   5 months ago Type 2 diabetes mellitus with stage 4 chronic kidney disease, with long-term current use of insulin ([place])   [place], Charlane Ferretti, MD   9 months ago Type 2 diabetes mellitus with stage 4 chronic kidney disease, with long-term current use of insulin [facility])   [place], [place], [place]

## 2021-11-21 NOTE — Telephone Encounter (Signed)
Copied from [place]. Topic: Referral - Status >> Nov 21, 2021 11:25 AM Everette C wrote: Reason for CRM: [name] with [facility] has called to share that the patient's referral for physical therapy will have to be declined due to staffing  Please contact further if needed

## 2021-11-21 NOTE — Telephone Encounter (Signed)
Requested medication (s) are due for refill today: yes  Requested medication (s) are on the active medication list: yes  Last refill:  testosterone 10/07/21, tizanidine 09/03/21, Chantix 10/28/21  Future visit scheduled: yes  Notes to clinic:  tizanidine not delegated, testosterone and Chantix no protocol attached     Requested Prescriptions  Pending Prescriptions Disp Refills   varenicline (CHANTIX) 1 MG tablet [Pharmacy Med Name: VARENICLINE  '1MG'$   TAB] 56 tablet 0    Sig: TAKE 1 TABLET BY MOUTH TWICE  DAILY     Psychiatry:  Drug Dependence Therapy - varenicline Failed - 11/21/2021 10:51 AM      Failed - Manual Review: Do not refill starter pack. 1 mg tabs may be extended up to one year if the patient has quit smoking but still feels at risk for relapse.      Failed - Cr in normal range and within 180 days    Creatinine, Ser  Date Value Ref Range Status  11/19/2021 2.55 (H) 0.76 - 1.27 mg/dL Final   Creatinine,U  Date Value Ref Range Status  04/18/2015 250.2 mg/dL Final   Creatinine, Urine  Date Value Ref Range Status  10/03/2015 35.87 mg/dL Final         Passed - Completed PHQ-2 or PHQ-9 in the last 360 days      Passed - Valid encounter within last 6 months    Recent Outpatient Visits           2 days ago Type 2 diabetes mellitus with stage 4 chronic kidney disease, with long-term current use of insulin ([place])   [place], Charlane Ferretti, MD   2 months ago Type 2 diabetes mellitus with stage 4 chronic kidney disease, with long-term current use of insulin ([place])   [place], [place], MD   3 months ago Postural drop in blood pressure   [place], [place] W, NP   5 months ago Type 2 diabetes mellitus with stage 4 chronic kidney disease, with long-term current use of insulin ([place])   [place], Charlane Ferretti, MD   9 months ago Type 2  diabetes mellitus with stage 4 chronic kidney disease, with long-term current use of insulin [facility])   [place] Caldron M, [place]               Testosterone 20.25 MG/ACT (1.62%) GEL [Pharmacy Med Name: TESTOSTERONE GEL PUMP 1.62% (ANDROGEL)] 75 g     Sig: APPLY TOPICALLY 1 PUMP ONCE  DAILY TO THE UPPER ARMS     Off-Protocol Failed - 11/21/2021 10:51 AM      Failed - Medication not assigned to a protocol, review manually.      Passed - Valid encounter within last 12 months    Recent Outpatient Visits           2 days ago Type 2 diabetes mellitus with stage 4 chronic kidney disease, with long-term current use of insulin ([place])   [place], [place], MD   2 months ago Type 2 diabetes mellitus with stage 4 chronic kidney disease, with long-term current use of insulin ([place])   [place], [place], MD   3 months ago Postural drop in blood pressure   [place], NP   5 months ago Type  2 diabetes mellitus with stage 4 chronic kidney disease, with long-term current use of insulin ([place])   [place], [place], MD   9 months ago Type 2 diabetes mellitus with stage 4 chronic kidney disease, with long-term current use of insulin ([place])   [place], Levada Dy M, [place]               tiZANidine (ZANAFLEX) 4 MG tablet [Pharmacy Med Name: tiZANidine HCl 4 MG Oral Tablet] 90 tablet 1    Sig: TAKE 1 TABLET BY MOUTH EVERY 8  HOURS AS NEEDED FOR MUSCLE  SPASM(S)     Not Delegated - Cardiovascular:  Alpha-2 Agonists - tizanidine Failed - 11/21/2021 10:51 AM      Failed - This refill cannot be delegated      Passed - Valid encounter within last 6 months    Recent Outpatient Visits           2 days ago Type 2 diabetes mellitus with stage 4 chronic kidney disease, with  long-term current use of insulin ([place])   [place], Charlane Ferretti, MD   2 months ago Type 2 diabetes mellitus with stage 4 chronic kidney disease, with long-term current use of insulin ([place])   [place], Charlane Ferretti, MD   3 months ago Postural drop in blood pressure   [place], [place] W, NP   5 months ago Type 2 diabetes mellitus with stage 4 chronic kidney disease, with long-term current use of insulin ([place])   [place], Charlane Ferretti, MD   9 months ago Type 2 diabetes mellitus with stage 4 chronic kidney disease, with long-term current use of insulin [facility])   [place], [place], [place]              Signed Prescriptions Disp Refills   pantoprazole (PROTONIX) 40 MG tablet 100 tablet 2    Sig: TAKE 1 TABLET BY MOUTH DAILY     Gastroenterology: Proton Pump Inhibitors Passed - 11/21/2021 10:51 AM      Passed - Valid encounter within last 12 months    Recent Outpatient Visits           2 days ago Type 2 diabetes mellitus with stage 4 chronic kidney disease, with long-term current use of insulin ([place])   [place], Charlane Ferretti, MD   2 months ago Type 2 diabetes mellitus with stage 4 chronic kidney disease, with long-term current use of insulin ([place])   [place], [place], MD   3 months ago Postural drop in blood pressure   [place], [place] W, NP   5 months ago Type 2 diabetes mellitus with stage 4 chronic kidney disease, with long-term current use of insulin ([place])   [place], [place], MD   9 months ago Type 2 diabetes mellitus with stage 4 chronic kidney disease, with long-term current use of insulin ([place])   [place],  Levada Dy M, PA-C               fluticasone (FLONASE) 50 MCG/ACT nasal spray 32 g 1    Sig: USE 1 SPRAY IN BOTH NOSTRILS  DAILY     Ear, Nose, and Throat:  Nasal Preparations - Corticosteroids Passed - 11/21/2021 10:51 AM      Passed - Valid encounter within last 12 months    Recent Outpatient Visits           2 days ago Type 2 diabetes mellitus with stage 4 chronic kidney disease, with long-term current use of insulin ([place])   [place], [place], MD   2 months ago Type 2 diabetes mellitus with stage 4 chronic kidney disease, with long-term current use of insulin ([place])   [place], [place], MD   3 months ago Postural drop in blood pressure   [place], [place] W, NP   5 months ago Type 2 diabetes mellitus with stage 4 chronic kidney disease, with long-term current use of insulin ([place])   [place], [place], MD   9 months ago Type 2 diabetes mellitus with stage 4 chronic kidney disease, with long-term current use of insulin [facility])   [place], [place], [place]

## 2021-11-21 NOTE — Telephone Encounter (Signed)
Message received from Brittany Robinson/ [facility] stating they are not able to accept the referral.  Referral faxed to [facility] and [facility] for review.  

## 2021-11-21 NOTE — Telephone Encounter (Signed)
Routing to PCP for review.

## 2021-11-22 ENCOUNTER — Encounter: Payer: Self-pay | Admitting: Family Medicine

## 2021-11-22 NOTE — Telephone Encounter (Signed)
Can you please inform him and inquire if he is open to outpatient PT and I can place a referral for that for him?  Thank you

## 2021-11-25 NOTE — Telephone Encounter (Signed)
I spoke to [facility] who said they declined the referral.  I also spoke to [facility] who declined the referral.

## 2021-11-25 NOTE — Telephone Encounter (Signed)
I spoke to [place] / [facility] who requested referral be faxed for review # [phone].  I also spoke to [place] who requested referral be faxed for review # [phone].  Referrals then faxed as requested.

## 2021-11-26 LAB — TESTOSTERONE, FREE, TOTAL, SHBG
Sex Hormone Binding: 22.9 nmol/L (ref 19.3–76.4)
Testosterone, Free: 14.7 pg/mL (ref 7.2–24.0)
Testosterone: 239 ng/dL — ABNORMAL LOW (ref 264–916)

## 2021-11-26 LAB — SPECIMEN STATUS REPORT

## 2021-11-26 NOTE — Telephone Encounter (Signed)
I spoke to [facility] who said they didn't receive the fax and she requested I re-fax as well as send via secure email to [email or web address]. the referral was sent as requested and the fax number for [facility] was confirmed.   I spoke to [facility] and they are not able to accept the referral.  I spoke to [facility] and faxed the referral to her for review as she requested.  Fax # [phone]

## 2021-11-26 NOTE — Telephone Encounter (Signed)
Call placed to patient and VM was left informing patient to return phone call.   PCP would like to know if patient is agreeable with outpatient PT.

## 2021-11-26 NOTE — Telephone Encounter (Signed)
Message received from [facility], Account Executive for [facility] stating that they are not able to accept the referral

## 2021-11-27 DIAGNOSIS — E1165 Type 2 diabetes mellitus with hyperglycemia: Secondary | ICD-10-CM | POA: Diagnosis not present

## 2021-11-27 NOTE — Telephone Encounter (Signed)
Message received from [facility] stating that they are not able to accept the referral.   I spoke to [facility] and they are able to accept the referral. I called patient # [phone] to inform him that [facility] will be contacting him to schedule a home visit. Message left with call back requested.

## 2021-11-29 ENCOUNTER — Other Ambulatory Visit: Payer: Self-pay | Admitting: Family Medicine

## 2021-11-29 MED ORDER — VARENICLINE TARTRATE 0.5 MG PO TABS: 0.5000 mg | ORAL_TABLET | Freq: Two times a day (BID) | ORAL | 2 refills | Status: DC

## 2021-12-01 ENCOUNTER — Inpatient Hospital Stay (HOSPITAL_COMMUNITY): Payer: Medicare Other

## 2021-12-01 ENCOUNTER — Emergency Department (HOSPITAL_COMMUNITY): Payer: Medicare Other

## 2021-12-01 ENCOUNTER — Inpatient Hospital Stay (HOSPITAL_COMMUNITY)
Admission: EM | Admit: 2021-12-01 | Discharge: 2021-12-20 | DRG: 091 | Disposition: A | Discharge status: Other Acute Inpatient Hospital | Payer: Medicare Other | Attending: Internal Medicine | Admitting: Internal Medicine

## 2021-12-01 DIAGNOSIS — F250 Schizoaffective disorder, bipolar type: Secondary | ICD-10-CM | POA: Diagnosis present

## 2021-12-01 DIAGNOSIS — I129 Hypertensive chronic kidney disease with stage 1 through stage 4 chronic kidney disease, or unspecified chronic kidney disease: Secondary | ICD-10-CM | POA: Diagnosis not present

## 2021-12-01 DIAGNOSIS — R404 Transient alteration of awareness: Secondary | ICD-10-CM | POA: Diagnosis not present

## 2021-12-01 DIAGNOSIS — Z96642 Presence of left artificial hip joint: Secondary | ICD-10-CM | POA: Diagnosis not present

## 2021-12-01 DIAGNOSIS — Z96643 Presence of artificial hip joint, bilateral: Secondary | ICD-10-CM | POA: Diagnosis present

## 2021-12-01 DIAGNOSIS — R000 Tachycardia, unspecified: Secondary | ICD-10-CM | POA: Diagnosis not present

## 2021-12-01 DIAGNOSIS — R4020 Unspecified coma: Secondary | ICD-10-CM

## 2021-12-01 DIAGNOSIS — E041 Nontoxic single thyroid nodule: Secondary | ICD-10-CM | POA: Diagnosis not present

## 2021-12-01 DIAGNOSIS — E860 Dehydration: Secondary | ICD-10-CM | POA: Diagnosis not present

## 2021-12-01 DIAGNOSIS — Z043 Encounter for examination and observation following other accident: Secondary | ICD-10-CM | POA: Diagnosis not present

## 2021-12-01 DIAGNOSIS — Z833 Family history of diabetes mellitus: Secondary | ICD-10-CM

## 2021-12-01 DIAGNOSIS — G929 Unspecified toxic encephalopathy: Secondary | ICD-10-CM | POA: Diagnosis not present

## 2021-12-01 DIAGNOSIS — E1111 Type 2 diabetes mellitus with ketoacidosis with coma: Secondary | ICD-10-CM

## 2021-12-01 DIAGNOSIS — R296 Repeated falls: Secondary | ICD-10-CM | POA: Diagnosis present

## 2021-12-01 DIAGNOSIS — W19XXXA Unspecified fall, initial encounter: Secondary | ICD-10-CM | POA: Diagnosis present

## 2021-12-01 DIAGNOSIS — Y92009 Unspecified place in unspecified non-institutional (private) residence as the place of occurrence of the external cause: Secondary | ICD-10-CM

## 2021-12-01 DIAGNOSIS — D500 Iron deficiency anemia secondary to blood loss (chronic): Secondary | ICD-10-CM | POA: Diagnosis not present

## 2021-12-01 DIAGNOSIS — R509 Fever, unspecified: Secondary | ICD-10-CM | POA: Diagnosis not present

## 2021-12-01 DIAGNOSIS — F13239 Sedative, hypnotic or anxiolytic dependence with withdrawal, unspecified: Secondary | ICD-10-CM | POA: Diagnosis not present

## 2021-12-01 DIAGNOSIS — N1831 Chronic kidney disease, stage 3a: Secondary | ICD-10-CM | POA: Diagnosis not present

## 2021-12-01 DIAGNOSIS — R55 Syncope and collapse: Secondary | ICD-10-CM | POA: Diagnosis not present

## 2021-12-01 DIAGNOSIS — L98429 Non-pressure chronic ulcer of back with unspecified severity: Secondary | ICD-10-CM | POA: Diagnosis present

## 2021-12-01 DIAGNOSIS — Z0389 Encounter for observation for other suspected diseases and conditions ruled out: Secondary | ICD-10-CM | POA: Diagnosis not present

## 2021-12-01 DIAGNOSIS — F0390 Unspecified dementia without behavioral disturbance: Secondary | ICD-10-CM | POA: Diagnosis not present

## 2021-12-01 DIAGNOSIS — N184 Chronic kidney disease, stage 4 (severe): Secondary | ICD-10-CM | POA: Diagnosis not present

## 2021-12-01 DIAGNOSIS — Z471 Aftercare following joint replacement surgery: Secondary | ICD-10-CM | POA: Diagnosis not present

## 2021-12-01 DIAGNOSIS — E1110 Type 2 diabetes mellitus with ketoacidosis without coma: Secondary | ICD-10-CM | POA: Diagnosis present

## 2021-12-01 DIAGNOSIS — T50995A Adverse effect of other drugs, medicaments and biological substances, initial encounter: Secondary | ICD-10-CM | POA: Diagnosis not present

## 2021-12-01 DIAGNOSIS — Z8673 Personal history of transient ischemic attack (TIA), and cerebral infarction without residual deficits: Secondary | ICD-10-CM

## 2021-12-01 DIAGNOSIS — E785 Hyperlipidemia, unspecified: Secondary | ICD-10-CM | POA: Diagnosis not present

## 2021-12-01 DIAGNOSIS — G251 Drug-induced tremor: Secondary | ICD-10-CM | POA: Diagnosis present

## 2021-12-01 DIAGNOSIS — R739 Hyperglycemia, unspecified: Secondary | ICD-10-CM | POA: Diagnosis not present

## 2021-12-01 DIAGNOSIS — E1169 Type 2 diabetes mellitus with other specified complication: Secondary | ICD-10-CM | POA: Diagnosis not present

## 2021-12-01 DIAGNOSIS — G9340 Encephalopathy, unspecified: Secondary | ICD-10-CM | POA: Diagnosis not present

## 2021-12-01 DIAGNOSIS — G40909 Epilepsy, unspecified, not intractable, without status epilepticus: Secondary | ICD-10-CM | POA: Diagnosis present

## 2021-12-01 DIAGNOSIS — Z7984 Long term (current) use of oral hypoglycemic drugs: Secondary | ICD-10-CM

## 2021-12-01 DIAGNOSIS — Z91128 Patient's intentional underdosing of medication regimen for other reason: Secondary | ICD-10-CM

## 2021-12-01 DIAGNOSIS — Z79891 Long term (current) use of opiate analgesic: Secondary | ICD-10-CM

## 2021-12-01 DIAGNOSIS — G9349 Other encephalopathy: Secondary | ICD-10-CM

## 2021-12-01 DIAGNOSIS — E781 Pure hyperglyceridemia: Secondary | ICD-10-CM | POA: Diagnosis not present

## 2021-12-01 DIAGNOSIS — Z79899 Other long term (current) drug therapy: Secondary | ICD-10-CM

## 2021-12-01 DIAGNOSIS — R4182 Altered mental status, unspecified: Secondary | ICD-10-CM | POA: Diagnosis not present

## 2021-12-01 DIAGNOSIS — R0602 Shortness of breath: Secondary | ICD-10-CM | POA: Diagnosis not present

## 2021-12-01 DIAGNOSIS — E639 Nutritional deficiency, unspecified: Secondary | ICD-10-CM | POA: Diagnosis not present

## 2021-12-01 DIAGNOSIS — E440 Moderate protein-calorie malnutrition: Secondary | ICD-10-CM | POA: Diagnosis not present

## 2021-12-01 DIAGNOSIS — Z79890 Hormone replacement therapy (postmenopausal): Secondary | ICD-10-CM

## 2021-12-01 DIAGNOSIS — D649 Anemia, unspecified: Secondary | ICD-10-CM | POA: Diagnosis not present

## 2021-12-01 DIAGNOSIS — F0280 Dementia in other diseases classified elsewhere without behavioral disturbance: Secondary | ICD-10-CM | POA: Diagnosis present

## 2021-12-01 DIAGNOSIS — Z818 Family history of other mental and behavioral disorders: Secondary | ICD-10-CM

## 2021-12-01 DIAGNOSIS — G249 Dystonia, unspecified: Secondary | ICD-10-CM | POA: Diagnosis present

## 2021-12-01 DIAGNOSIS — M7732 Calcaneal spur, left foot: Secondary | ICD-10-CM | POA: Diagnosis not present

## 2021-12-01 DIAGNOSIS — Z87891 Personal history of nicotine dependence: Secondary | ICD-10-CM | POA: Diagnosis not present

## 2021-12-01 DIAGNOSIS — D638 Anemia in other chronic diseases classified elsewhere: Secondary | ICD-10-CM | POA: Diagnosis not present

## 2021-12-01 DIAGNOSIS — F4310 Post-traumatic stress disorder, unspecified: Secondary | ICD-10-CM | POA: Diagnosis present

## 2021-12-01 DIAGNOSIS — E871 Hypo-osmolality and hyponatremia: Secondary | ICD-10-CM | POA: Diagnosis not present

## 2021-12-01 DIAGNOSIS — F13230 Sedative, hypnotic or anxiolytic dependence with withdrawal, uncomplicated: Secondary | ICD-10-CM | POA: Diagnosis not present

## 2021-12-01 DIAGNOSIS — Z794 Long term (current) use of insulin: Secondary | ICD-10-CM

## 2021-12-01 DIAGNOSIS — R0600 Dyspnea, unspecified: Secondary | ICD-10-CM | POA: Diagnosis not present

## 2021-12-01 DIAGNOSIS — D631 Anemia in chronic kidney disease: Secondary | ICD-10-CM | POA: Diagnosis not present

## 2021-12-01 DIAGNOSIS — R410 Disorientation, unspecified: Secondary | ICD-10-CM | POA: Diagnosis not present

## 2021-12-01 DIAGNOSIS — M6282 Rhabdomyolysis: Secondary | ICD-10-CM | POA: Diagnosis not present

## 2021-12-01 DIAGNOSIS — J9601 Acute respiratory failure with hypoxia: Secondary | ICD-10-CM | POA: Diagnosis not present

## 2021-12-01 DIAGNOSIS — G20 Parkinson's disease: Secondary | ICD-10-CM | POA: Diagnosis not present

## 2021-12-01 DIAGNOSIS — L89012 Pressure ulcer of right elbow, stage 2: Secondary | ICD-10-CM | POA: Diagnosis present

## 2021-12-01 DIAGNOSIS — Z885 Allergy status to narcotic agent status: Secondary | ICD-10-CM

## 2021-12-01 DIAGNOSIS — E1142 Type 2 diabetes mellitus with diabetic polyneuropathy: Secondary | ICD-10-CM | POA: Diagnosis present

## 2021-12-01 DIAGNOSIS — M19071 Primary osteoarthritis, right ankle and foot: Secondary | ICD-10-CM | POA: Diagnosis not present

## 2021-12-01 DIAGNOSIS — K219 Gastro-esophageal reflux disease without esophagitis: Secondary | ICD-10-CM | POA: Diagnosis not present

## 2021-12-01 DIAGNOSIS — F0393 Unspecified dementia, unspecified severity, with mood disturbance: Secondary | ICD-10-CM | POA: Diagnosis not present

## 2021-12-01 DIAGNOSIS — I10 Essential (primary) hypertension: Secondary | ICD-10-CM | POA: Diagnosis not present

## 2021-12-01 DIAGNOSIS — E8809 Other disorders of plasma-protein metabolism, not elsewhere classified: Secondary | ICD-10-CM | POA: Diagnosis present

## 2021-12-01 DIAGNOSIS — N189 Chronic kidney disease, unspecified: Secondary | ICD-10-CM | POA: Diagnosis not present

## 2021-12-01 DIAGNOSIS — A770 Spotted fever due to Rickettsia rickettsii: Secondary | ICD-10-CM | POA: Diagnosis not present

## 2021-12-01 DIAGNOSIS — L89890 Pressure ulcer of other site, unstageable: Secondary | ICD-10-CM | POA: Diagnosis not present

## 2021-12-01 DIAGNOSIS — E870 Hyperosmolality and hypernatremia: Secondary | ICD-10-CM | POA: Diagnosis not present

## 2021-12-01 DIAGNOSIS — E1122 Type 2 diabetes mellitus with diabetic chronic kidney disease: Secondary | ICD-10-CM | POA: Diagnosis not present

## 2021-12-01 DIAGNOSIS — J690 Pneumonitis due to inhalation of food and vomit: Secondary | ICD-10-CM | POA: Diagnosis not present

## 2021-12-01 DIAGNOSIS — R836 Abnormal cytological findings in cerebrospinal fluid: Secondary | ICD-10-CM | POA: Diagnosis not present

## 2021-12-01 DIAGNOSIS — J9811 Atelectasis: Secondary | ICD-10-CM | POA: Diagnosis not present

## 2021-12-01 DIAGNOSIS — E46 Unspecified protein-calorie malnutrition: Secondary | ICD-10-CM | POA: Diagnosis not present

## 2021-12-01 DIAGNOSIS — R531 Weakness: Secondary | ICD-10-CM | POA: Diagnosis not present

## 2021-12-01 DIAGNOSIS — J189 Pneumonia, unspecified organism: Secondary | ICD-10-CM | POA: Diagnosis not present

## 2021-12-01 DIAGNOSIS — G928 Other toxic encephalopathy: Principal | ICD-10-CM | POA: Diagnosis present

## 2021-12-01 DIAGNOSIS — Z66 Do not resuscitate: Secondary | ICD-10-CM | POA: Diagnosis not present

## 2021-12-01 DIAGNOSIS — F259 Schizoaffective disorder, unspecified: Secondary | ICD-10-CM

## 2021-12-01 DIAGNOSIS — Z981 Arthrodesis status: Secondary | ICD-10-CM | POA: Diagnosis not present

## 2021-12-01 DIAGNOSIS — E1165 Type 2 diabetes mellitus with hyperglycemia: Secondary | ICD-10-CM | POA: Diagnosis not present

## 2021-12-01 DIAGNOSIS — Z9151 Personal history of suicidal behavior: Secondary | ICD-10-CM | POA: Diagnosis not present

## 2021-12-01 DIAGNOSIS — Z931 Gastrostomy status: Secondary | ICD-10-CM | POA: Diagnosis not present

## 2021-12-01 DIAGNOSIS — Z4682 Encounter for fitting and adjustment of non-vascular catheter: Secondary | ICD-10-CM | POA: Diagnosis not present

## 2021-12-01 DIAGNOSIS — E1140 Type 2 diabetes mellitus with diabetic neuropathy, unspecified: Secondary | ICD-10-CM | POA: Diagnosis not present

## 2021-12-01 DIAGNOSIS — L89152 Pressure ulcer of sacral region, stage 2: Secondary | ICD-10-CM | POA: Diagnosis not present

## 2021-12-01 DIAGNOSIS — Z20822 Contact with and (suspected) exposure to covid-19: Secondary | ICD-10-CM | POA: Diagnosis present

## 2021-12-01 DIAGNOSIS — Z9181 History of falling: Secondary | ICD-10-CM

## 2021-12-01 DIAGNOSIS — L89899 Pressure ulcer of other site, unspecified stage: Secondary | ICD-10-CM | POA: Diagnosis not present

## 2021-12-01 DIAGNOSIS — N179 Acute kidney failure, unspecified: Secondary | ICD-10-CM | POA: Diagnosis not present

## 2021-12-01 DIAGNOSIS — R451 Restlessness and agitation: Secondary | ICD-10-CM | POA: Diagnosis not present

## 2021-12-01 DIAGNOSIS — R7401 Elevation of levels of liver transaminase levels: Secondary | ICD-10-CM | POA: Diagnosis not present

## 2021-12-01 DIAGNOSIS — R0902 Hypoxemia: Secondary | ICD-10-CM | POA: Diagnosis not present

## 2021-12-01 DIAGNOSIS — F061 Catatonic disorder due to known physiological condition: Secondary | ICD-10-CM | POA: Diagnosis not present

## 2021-12-01 DIAGNOSIS — F988 Other specified behavioral and emotional disorders with onset usually occurring in childhood and adolescence: Secondary | ICD-10-CM | POA: Diagnosis present

## 2021-12-01 DIAGNOSIS — T796XXA Traumatic ischemia of muscle, initial encounter: Secondary | ICD-10-CM | POA: Diagnosis not present

## 2021-12-01 DIAGNOSIS — E875 Hyperkalemia: Secondary | ICD-10-CM | POA: Diagnosis present

## 2021-12-01 DIAGNOSIS — L89029 Pressure ulcer of left elbow, unspecified stage: Secondary | ICD-10-CM | POA: Diagnosis not present

## 2021-12-01 DIAGNOSIS — F319 Bipolar disorder, unspecified: Secondary | ICD-10-CM | POA: Diagnosis present

## 2021-12-01 DIAGNOSIS — Z6823 Body mass index (BMI) 23.0-23.9, adult: Secondary | ICD-10-CM

## 2021-12-01 DIAGNOSIS — E291 Testicular hypofunction: Secondary | ICD-10-CM | POA: Diagnosis present

## 2021-12-01 DIAGNOSIS — Z91199 Patient's noncompliance with other medical treatment and regimen due to unspecified reason: Secondary | ICD-10-CM

## 2021-12-01 DIAGNOSIS — R0682 Tachypnea, not elsewhere classified: Secondary | ICD-10-CM | POA: Diagnosis not present

## 2021-12-01 DIAGNOSIS — R0989 Other specified symptoms and signs involving the circulatory and respiratory systems: Secondary | ICD-10-CM | POA: Diagnosis not present

## 2021-12-01 DIAGNOSIS — F202 Catatonic schizophrenia: Secondary | ICD-10-CM | POA: Diagnosis present

## 2021-12-01 DIAGNOSIS — D696 Thrombocytopenia, unspecified: Secondary | ICD-10-CM | POA: Diagnosis not present

## 2021-12-01 LAB — COMPREHENSIVE METABOLIC PANEL
ALT: 44 U/L (ref 0–44)
AST: 67 U/L — ABNORMAL HIGH (ref 15–41)
Albumin: 4.4 g/dL (ref 3.5–5.0)
Alkaline Phosphatase: 59 U/L (ref 38–126)
Anion gap: 27 — ABNORMAL HIGH (ref 5–15)
BUN: 66 mg/dL — ABNORMAL HIGH (ref 6–20)
CO2: 13 mmol/L — ABNORMAL LOW (ref 22–32)
Calcium: 10.6 mg/dL — ABNORMAL HIGH (ref 8.9–10.3)
Chloride: 101 mmol/L (ref 98–111)
Creatinine, Ser: 3.24 mg/dL — ABNORMAL HIGH (ref 0.61–1.24)
GFR, Estimated: 22 mL/min — ABNORMAL LOW (ref >60)
Glucose, Bld: 318 mg/dL — ABNORMAL HIGH (ref 70–99)
Potassium: 5.3 mmol/L — ABNORMAL HIGH (ref 3.5–5.1)
Sodium: 141 mmol/L (ref 135–145)
Total Bilirubin: 2 mg/dL — ABNORMAL HIGH (ref 0.3–1.2)
Total Protein: 8.3 g/dL — ABNORMAL HIGH (ref 6.5–8.1)

## 2021-12-01 LAB — URINALYSIS, ROUTINE W REFLEX MICROSCOPIC
Bilirubin Urine: NEGATIVE
Glucose, UA: >=500 mg/dL — AB
Ketones, ur: 80 mg/dL — AB
Leukocytes,Ua: NEGATIVE
Nitrite: NEGATIVE
Protein, ur: 100 mg/dL — AB
Specific Gravity, Urine: 1.024 (ref 1.005–1.030)
pH: 5 (ref 5.0–8.0)

## 2021-12-01 LAB — BASIC METABOLIC PANEL
Anion gap: 20 — ABNORMAL HIGH (ref 5–15)
BUN: 52 mg/dL — ABNORMAL HIGH (ref 6–20)
CO2: 13 mmol/L — ABNORMAL LOW (ref 22–32)
Calcium: 8.7 mg/dL — ABNORMAL LOW (ref 8.9–10.3)
Chloride: 108 mmol/L (ref 98–111)
Creatinine, Ser: 2.24 mg/dL — ABNORMAL HIGH (ref 0.61–1.24)
GFR, Estimated: 34 mL/min — ABNORMAL LOW (ref >60)
Glucose, Bld: 241 mg/dL — ABNORMAL HIGH (ref 70–99)
Potassium: 4.7 mmol/L (ref 3.5–5.1)
Sodium: 141 mmol/L (ref 135–145)

## 2021-12-01 LAB — CBC
HCT: 45.8 % (ref 39.0–52.0)
Hemoglobin: 15.3 g/dL (ref 13.0–17.0)
MCH: 28.7 pg (ref 26.0–34.0)
MCHC: 33.4 g/dL (ref 30.0–36.0)
MCV: 85.9 fL (ref 80.0–100.0)
Platelets: 222 10*3/uL (ref 150–400)
RBC: 5.33 MIL/uL (ref 4.22–5.81)
RDW: 13 % (ref 11.5–15.5)
WBC: 10.6 10*3/uL — ABNORMAL HIGH (ref 4.0–10.5)
nRBC: 0 % (ref 0.0–0.2)

## 2021-12-01 LAB — APTT: aPTT: 25 seconds (ref 24–36)

## 2021-12-01 LAB — RESP PANEL BY RT-PCR (FLU A&B, COVID) ARPGX2
Influenza A by PCR: NEGATIVE
Influenza B by PCR: NEGATIVE
SARS Coronavirus 2 by RT PCR: NEGATIVE

## 2021-12-01 LAB — CK: Total CK: 1261 U/L — ABNORMAL HIGH (ref 49–397)

## 2021-12-01 LAB — CBG MONITORING, ED
Glucose-Capillary: 328 mg/dL — ABNORMAL HIGH (ref 70–99)
Glucose-Capillary: 302 mg/dL — ABNORMAL HIGH (ref 70–99)

## 2021-12-01 LAB — AMMONIA: Ammonia: 44 umol/L — ABNORMAL HIGH (ref 9–35)

## 2021-12-01 LAB — LACTIC ACID, PLASMA
Lactic Acid, Venous: 2.9 mmol/L (ref 0.5–1.9)
Lactic Acid, Venous: 1.6 mmol/L (ref 0.5–1.9)

## 2021-12-01 LAB — PROTIME-INR
INR: 1.2 (ref 0.8–1.2)
Prothrombin Time: 14.8 seconds (ref 11.4–15.2)

## 2021-12-01 LAB — VALPROIC ACID LEVEL: Valproic Acid Lvl: <10 ug/mL — ABNORMAL LOW (ref 50.0–100.0)

## 2021-12-01 MED ORDER — ACETAMINOPHEN 325 MG PO TABS
650.0000 mg | ORAL_TABLET | Freq: Four times a day (QID) | ORAL | Status: DC | PRN
  Administered 2021-12-11: 650 mg via ORAL
  Filled 2021-12-01: qty 2

## 2021-12-01 MED ORDER — LACTATED RINGERS IV SOLN: INTRAVENOUS | Status: AC

## 2021-12-01 MED ORDER — DEXTROSE IN LACTATED RINGERS 5 % IV SOLN: INTRAVENOUS | Status: DC

## 2021-12-01 MED ORDER — FOLIC ACID 5 MG/ML IJ SOLN
1.0000 mg | Freq: Every day | INTRAMUSCULAR | Status: DC
  Administered 2021-12-03 – 2021-12-15 (×13): 1 mg via INTRAVENOUS
  Filled 2021-12-01 (×16): qty 0.2

## 2021-12-01 MED ORDER — INSULIN REGULAR(HUMAN) IN NACL 100-0.9 UT/100ML-% IV SOLN
INTRAVENOUS | Status: DC
  Administered 2021-12-02 (×2): 13 [IU]/h via INTRAVENOUS
  Filled 2021-12-01 (×3): qty 100

## 2021-12-01 MED ORDER — ACETAMINOPHEN 650 MG RE SUPP
650.0000 mg | Freq: Four times a day (QID) | RECTAL | Status: DC | PRN
  Administered 2021-12-02 – 2021-12-08 (×6): 650 mg via RECTAL
  Filled 2021-12-01 (×7): qty 1

## 2021-12-01 MED ORDER — SODIUM CHLORIDE 0.9 % IV SOLN
500.0000 mg | INTRAVENOUS | Status: DC
  Administered 2021-12-01: 500 mg via INTRAVENOUS
  Filled 2021-12-01: qty 5

## 2021-12-01 MED ORDER — SODIUM CHLORIDE 0.9 % IV SOLN
2.0000 g | INTRAVENOUS | Status: DC
  Administered 2021-12-01: 2 g via INTRAVENOUS
  Filled 2021-12-01: qty 20

## 2021-12-01 MED ORDER — LACTATED RINGERS IV SOLN: INTRAVENOUS | Status: DC

## 2021-12-01 MED ORDER — DEXTROSE 50 % IV SOLN: 0.0000 mL | INTRAVENOUS | Status: DC | PRN

## 2021-12-01 MED ORDER — SODIUM CHLORIDE 0.9 % IV BOLUS
1000.0000 mL | Freq: Once | INTRAVENOUS | Status: AC
  Administered 2021-12-01: 1000 mL via INTRAVENOUS

## 2021-12-01 MED ORDER — VALPROATE SODIUM 100 MG/ML IV SOLN
500.0000 mg | Freq: Once | INTRAVENOUS | Status: DC
  Filled 2021-12-01: qty 5

## 2021-12-01 MED ORDER — THIAMINE HCL 100 MG/ML IJ SOLN
100.0000 mg | Freq: Every day | INTRAMUSCULAR | Status: DC
  Administered 2021-12-02 – 2021-12-15 (×14): 100 mg via INTRAVENOUS
  Filled 2021-12-01 (×14): qty 2

## 2021-12-01 MED ORDER — SODIUM CHLORIDE 0.9 % IV BOLUS (SEPSIS)
1000.0000 mL | Freq: Once | INTRAVENOUS | Status: AC
  Administered 2021-12-01: 1000 mL via INTRAVENOUS

## 2021-12-01 MED ORDER — ENOXAPARIN SODIUM 30 MG/0.3ML IJ SOSY
30.0000 mg | PREFILLED_SYRINGE | INTRAMUSCULAR | Status: DC
  Administered 2021-12-02 – 2021-12-03 (×2): 30 mg via SUBCUTANEOUS
  Filled 2021-12-01 (×2): qty 0.3

## 2021-12-01 MED ORDER — LORAZEPAM 2 MG/ML IJ SOLN
1.0000 mg | Freq: Four times a day (QID) | INTRAMUSCULAR | Status: AC
  Administered 2021-12-02 (×3): 1 mg via INTRAVENOUS
  Filled 2021-12-01 (×3): qty 1

## 2021-12-01 MED ORDER — LORAZEPAM 2 MG/ML IJ SOLN
1.0000 mg | Freq: Once | INTRAMUSCULAR | Status: AC
  Administered 2021-12-01: 1 mg via INTRAVENOUS
  Filled 2021-12-01: qty 1

## 2021-12-01 NOTE — ED Notes (Signed)
Pt back from CT

## 2021-12-01 NOTE — ED Triage Notes (Signed)
Pt last seen Friday with home health nurse. Pt did not answer phone all weekend, social services called for a welfare check. Upon EMS arrival pt on floor next to bed covered in feces and urine. Pt per sister is not at baseline.

## 2021-12-01 NOTE — ED Notes (Signed)
Neuro provider and admitting providers bedside speaking to family.

## 2021-12-01 NOTE — H&P (Addendum)
Date: 12/01/2021               Patient Name:  [name]. MRN: [id number]  DOB: July 08, 1965 Age / Sex: 56 y.o., male   PCP: [name], MD         Medical Service: Internal Medicine Teaching Service         Attending Physician: Dr. Noemi Chapel, MD    First Contact: [name], DO      Pager: Lavone Nian [phone]      Second Contact: Sanjuana Letters, DO      Pager: Maryjean Morn [phone]           After Hours (After 5p/  First Contact Pager: [phone]  weekends / holidays): Second Contact Pager: [phone]   SUBJECTIVE   Chief Complaint: Altered mental status, found down  History of Present Illness: [name]. is a 56 year old male with PMH T2DM, CKD, hip osteoarthritis status post replacements bilaterally, bipolar 1, schizoaffective disorder, prior suicide attempt, substance use, polypharmacy presenting to the ED after being found down at his home.  All history is obtained from his sister who is in the room.  His sister last contacted him on Friday 8/11 and he was also seen by his home health nurse at that time.  She says he was acting normal at that time and did not mention anything new such as new symptoms or sick contacts.  He did adopt a kitten last week and did recently start lubiprostone on 8/1.  A welfare check was called on on Sunday 8/11 because no one had heard from him since Friday.  Police/EMS found him on the floor in a pool of urine and feces.  He was noted to be awake but not responding to instructions and resisting exam at that time.  His extremities were also cold at that time, but core was warm.  He does have prior history of multiple falls, sister states that she believes he has a seizure history but she is not sure when his seizures started or what kind of seizures he has.  She says she has previously seen him fall and then be on the ground shaking at times, but this description seems more in line with agitation or possibly  parkinsonism/tremor than with seizure disorder.  She says he sometimes loses consciousness when he falls and that she will notice them talking/murmuring to his himself with his eyes closed on the ground.  She also says that one of his doctors wanted him to be evaluated by cardiology for orthostatic hypotension.  He was also previously evaluated for Parkinson's, though it was felt that his parkinsonism and tremor are likely a byproduct of his medication regimen.  His sister believes that he might of completed advance care planning during his last admission here, but she is unsure.  From prior discussions with him she believes that he would prefer to be DNR.  Looking at his ACP documents now, there is no signed DNR, but he has appointed his Sister as one of his healthcare POA's.  ED Course: Brought to ED by EMS.  Diagnostic work-up as below.  See ED provider note for full details and initial impression.  Sepsis protocol started.  Patient given 1 mg IV Ativan, 1 L bolus NS and started on LR infusion, IV ceftriaxone and azithromycin.  Neurology was consulted and came to see the patient.  Meds:  Current Meds  Medication Sig   alfuzosin (UROXATRAL) 10 MG 24 hr tablet Take 10  mg by mouth at bedtime.    ARIPiprazole (ABILIFY) 5 MG tablet TAKE 1 TABLET BY MOUTH DAILY FOR DEPRESSION (Patient taking differently: Take 5 mg by mouth daily.)   atorvastatin (LIPITOR) 10 MG tablet Take 1 tablet (10 mg total) by mouth at bedtime.   buPROPion (WELLBUTRIN XL) 300 MG 24 hr tablet Take 1 tablet (300 mg total) by mouth daily.   busPIRone (BUSPAR) 15 MG tablet Take 15 mg by mouth 2 (two) times daily.   chlorhexidine (PERIDEX) 0.12 % solution Use as directed 10 mLs in the mouth or throat 2 (two) times daily.   ciclopirox (PENLAC) 8 % solution APPLY DAILY TO NAILS. ONCE  A WEEK FILE TOENAIL AND  CLEAN WITH ALCOHOL AND  REAPPLY (Patient taking differently: Apply 1 Application topically at bedtime.)   clonazePAM (KLONOPIN)  0.5 MG tablet Take 0.5 mg by mouth 2 (two) times daily as needed for anxiety.   divalproex (DEPAKOTE ER) 500 MG 24 hr tablet Take 500 mg by mouth daily.   fenofibrate (TRICOR) 145 MG tablet TAKE 1 TABLET BY MOUTH DAILY (Patient taking differently: Take 145 mg by mouth daily.)   fluticasone (FLONASE) 50 MCG/ACT nasal spray USE 1 SPRAY IN BOTH NOSTRILS  DAILY (Patient taking differently: Place 1 spray into both nostrils daily as needed for allergies.)   gabapentin (NEURONTIN) 300 MG capsule Take 1 capsule (300 mg total) by mouth 2 (two) times daily.   insulin glargine (LANTUS SOLOSTAR) 100 UNIT/ML Solostar Pen Inject 45-50 Units into the skin daily. (Patient taking differently: Inject 45-50 Units into the skin daily. Per sliding scale)   insulin lispro (HUMALOG KWIKPEN) 200 UNIT/ML KwikPen Inject 10-14 Units into the skin 3 (three) times daily before meals. (Patient taking differently: Inject 10-14 Units into the skin 3 (three) times daily before meals. Per sliding scale)   lisinopril (ZESTRIL) 2.5 MG tablet Take 1 tablet (2.5 mg total) by mouth daily.   lubiprostone (AMITIZA) 8 MCG capsule Take 1 capsule (8 mcg total) by mouth 2 (two) times daily with a meal.   mupirocin ointment (BACTROBAN) 2 % Apply 1 application topically 2 (two) times daily.   naloxone (NARCAN) nasal spray 4 mg/0.1 mL Place 1 spray into the nose daily as needed (For overdose).   oxyCODONE-acetaminophen (PERCOCET/ROXICET) 5-325 MG tablet Take 1 tablet by mouth every 6 (six) hours as needed for moderate pain.   pantoprazole (PROTONIX) 40 MG tablet TAKE 1 TABLET BY MOUTH DAILY   polyethylene glycol (MIRALAX / GLYCOLAX) 17 g packet Take 17 g by mouth daily.   QUEtiapine (SEROQUEL) 400 MG tablet Take 400 mg by mouth daily.   tadalafil (CIALIS) 5 MG tablet TAKE ONE TABLET BY MOUTH EVERY DAY FOR BPH (Patient taking differently: Take 5 mg by mouth daily. For BPH per sister)   tiZANidine (ZANAFLEX) 4 MG tablet TAKE 1 TABLET BY MOUTH EVERY  8  HOURS AS NEEDED FOR MUSCLE  SPASM(S) (Patient taking differently: Take 4 mg by mouth every 8 (eight) hours as needed for muscle spasms.)   TRADJENTA 5 MG TABS tablet Take 5 mg by mouth daily.   traZODone (DESYREL) 150 MG tablet Take 1 tablet (150 mg total) by mouth at bedtime.   varenicline (CHANTIX) 0.5 MG tablet Take 1 tablet (0.5 mg total) by mouth 2 (two) times daily.  His sister manages dispenses most of his medications.  She notes he is actually taking all of the centrally acting medications above.  She is uncertain whether he has been administering  his insulin himself and notes that he frequently has CBGs in the 300s to 400s.   Past Medical History  Past Surgical History:  Procedure Laterality Date   BACK SURGERY     CLOSED REDUCTION METACARPAL WITH PERCUTANEOUS PINNING Right    LUMBAR [place] SURGERY     TONSILLECTOMY     TOTAL HIP ARTHROPLASTY Right 08/16/2013   Procedure: TOTAL HIP ARTHROPLASTY ANTERIOR APPROACH;  Surgeon: Hessie Dibble, MD;  Location: [place];  Service: Orthopedics;  Laterality: Right;   TOTAL HIP ARTHROPLASTY Left 05/22/2015   Procedure: TOTAL HIP ARTHROPLASTY ANTERIOR APPROACH;  Surgeon: Melrose Nakayama, MD;  Location: [place];  Service: Orthopedics;  Laterality: Left;  [facility] as PCP.  Follows with [facility] for his diabetes management, follows with [facility] for neuropathy.  Follows with [facility].  Previously seen by [facility].  Uncertain who his psychiatrist is at this time.  Social:  Lives With: By himself, 1 pet as above.  Receives home health nursing/aide visits and his sister manages his medications.  His neighbors also frequently check on him. Occupation: Support: As above Level of Function: Ambulatory with a walker, but has a history of many falls.  Requires assistance with many of his ADLs and ADLs.  Not driving at this point. PCP: Dr.  Margarita Rana MD, family medicine Substances: Prior history of polysubstance use including marijuana, cocaine, possibly other illicit substances as well.  Family History:  Family History  Problem Relation Age of Onset   Diabetes Father    Cancer Mother        died of melanoma with mets   Cervical cancer Sister    Diabetes Sister    Other Neg Hx        hypogonadism   Colon cancer Neg Hx    Colon polyps Neg Hx    Esophageal cancer Neg Hx    Rectal cancer Neg Hx    Stomach cancer Neg Hx   -Family history of bipolar disorder, schizophrenia   Allergies: Allergies as of 12/01/2021 - Review Complete 12/01/2021  Allergen Reaction Noted   Vicodin [hydrocodone-acetaminophen] Itching 10/03/2015    Review of Systems: A complete ROS was negative except as per HPI.   OBJECTIVE:   Physical Exam: Blood pressure (!) 157/100, pulse (!) 120, temperature 98.6 F (37 C), temperature source Rectal, resp. rate (!) 21, SpO2 100 %.  Constitutional: Ill-appearing male laying in bed, occasionally grimacing and not able to engage in interview or exam.  Appears disheveled and malnourished. Cardiovascular: Tachycardic with regular rhythm, no murmurs, rubs or gallops Pulmonary/Chest: normal work of breathing on room air, lungs clear to auscultation bilaterally Abdominal: soft, non-tender, non-distended Extremities: Extremities cool.  No pitting edema present.  Extremity pulses intact, 2+.  Erythema and ecchymosis of bilateral big toes. Skin: Scattered excoriations on BLE/BUE Neuro: A&O x0.  Somnolent.  GCS 10, moves all extremities spontaneously but not following instructions, opens eyes to sound, attempts to vocalize but mostly grunting or mouthing words.  PE PERRL.  Strength intact in all extremities, resists extension of BUE with some rigidity.  Reflexes grossly hypoactive, no clonus.  Baseline resting tremor.   Labs:    Latest Ref Rng & Units 12/01/2021    5:40 PM 11/19/2021    4:40 PM 06/16/2021    1:20  PM 05/27/2021    2:26 PM 02/06/2021    3:42 PM 07/06/2020   11:16 AM 01/01/2020  3:20 PM  CBC EXTENDED  WBC 4.0 - 10.5 K/uL 10.6  4.7  6.8  5.0  5.8  8.4  8.0   RBC 4.22 - 5.81 MIL/uL 5.33  4.83  4.19  4.52  4.83  5.40  4.73   Hemoglobin 13.0 - 17.0 g/dL 15.3  14.1  12.6  13.7  14.7  16.7  13.9   HCT 39.0 - 52.0 % 45.8  40.5  36.8  40.7  42.8  47.8  41.2   Platelets 150 - 400 K/uL 222  140  137  130  151  175  124   NEUT# 1.4 - 7.0 x10E3/uL  2.4  5.2  2.7  2.8  6.4    Lymph# 0.7 - 3.1 x10E3/uL  1.7  0.9  1.7  2.3  1.2        Latest Ref Rng & Units 12/01/2021   10:00 PM 12/01/2021    5:40 PM 11/19/2021    4:40 PM 06/16/2021    1:20 PM 05/27/2021    2:26 PM 02/06/2021    3:42 PM 07/06/2020   11:16 AM  CMP  Glucose 70 - 99 mg/dL 241  318  112  154  214  85  229   BUN 6 - 20 mg/dL 52  66  33  32  '31  25  26   '$ Creatinine 0.61 - 1.24 mg/dL 2.24  3.24  2.55  2.47  2.22  2.40  1.97   Sodium 135 - 145 mmol/L 141  141  135  131  136  140  136   Potassium 3.5 - 5.1 mmol/L 4.7  5.3  4.7  5.1  4.9  4.7  4.9   Chloride 98 - 111 mmol/L 108  101  94  95  97  102  94   CO2 22 - 32 mmol/L '13  13  24  24  25  25  21   '$ Calcium 8.9 - 10.3 mg/dL 8.7  10.6  10.0  9.6  9.7  10.1  10.3   Total Protein 6.5 - 8.1 g/dL  8.3  7.7   7.8  8.0  7.5   Total Bilirubin 0.3 - 1.2 mg/dL  2.0  0.4   0.4  0.5  0.4   Alkaline Phos 38 - 126 U/L  59  49   74  52  85   AST 15 - 41 U/L  67  '28   21  23  23   '$ ALT 0 - 44 U/L  44  '24   14  15  16   '$ Ammonia 44, Depakote level undetectable CK 1261 RVP negative UA showing glucose greater than 500, large ketones, large hemoglobin, protein, rare bacteria Urine/blood cultures pending Lactic acid 2.9 CBGs 328, 302  Imaging: Chest x-ray: No acute cardiopulmonary pathology including cardiomegaly, pulmonary opacities, pulmonary edema, or effusions  Hip x-rays left/right: Previously imaged surgical fusion at L4-L5, and bilateral hip arthroplasties.  No acute fracture or  dislocation.  Foot x-rays bilaterally: Chronic degenerative changes and healing remote fractures.  No fracture or dislocation.  Noncontrast CT head/CT C-spine: Age advanced cerebral atrophy and ventricular enlargement.  Chronically increased attenuation of basilar artery 1 to prior CT head 12/2019.  No acute bleed, fracture, hydrocephalus.  No C-spine fractures.  1.5 cm incidental thyroid nodule.  EKG: personally reviewed my interpretation is sinus tachycardia with PACs. Prior EKG 12/2019  ASSESSMENT & PLAN:  [name]. is a 56 y.o. male  with PMH T2DM, CKD, hip osteoarthritis status post replacements bilaterally, bipolar 1, schizoaffective disorder, prior suicide attempt, substance use, polypharmacy brought to the ED after being found down for 1.5 to 2 days and admitted for acute encephalopathy on hospital day 0  #Acute encephalopathy #Polypharmacy #Falls #Orthostatic hypotension #History of seizure disorder #Benzodiazepine withdrawal Patient likely down for 1.5 to 2 days.  No clear inciting events, though he has an extensive history of presentations following falls with recent concern for orthostasis and significant centrally acting polypharmacy. No significant recent medication changes for lubiprostone.  Low concern for infectious etiology at this time and can likely D/C antibiotics tomorrow but will follow-up sepsis work-up which was started in the ED.  Patient is presenting with rhabdomyolysis, undetectable Depakote level, so unwitnessed seizure during the past 2 days cannot be ruled out.  However, patient is not currently seizing and does not appear to be postictal at this time, and his seizure history is also questionable as family has never seen him have a clear seizure and there is no documentation of one.  He is hypertensive, sinus tachycardic, and is grimacing on exam; of note he does take Klonopin at home so this can be due to benzodiazepine withdrawal.  Also some concern for  concurrent DKA (see below).  CT head is negative and patient has no focal neurological deficits, so acute infarct is unlikely at this time and can rule out with MRI tomorrow as discussed with neurology.  Neurology also evaluated for serotonin syndrome and low concern at this time. - Neurology following, appreciate recs - IV Ativan 1 mg every 6 hours for 24 hours, consider extending if needed -Follow-up EEG, MRI tomorrow a.m. - Follow-up blood cultures, urine cultures, likely can DC IV antibiotics tomorrow - Trend CBC, lactic acid - Follow-up UDS - Diabetes management as below - Hold Depakote for now because of elevated ammonia level.  Consider follow-up and restarting - Hold gabapentin, Abilify, trazodone, Wellbutrin, BuSpar - Continue Seroquel 50 mg nightly when patient is able to tolerate p.o. per neurology - Hold tizanidine, evaluate for orthostatics when patient has improved mentation - N.p.o., SLP eval when patient has better mentation - PT/OT/TOC eval when patient has better mentation  #DKA #T2DM Patient presents with hyperglycemia, urine ketones, anion gap of 27 after being down on the ground for 2 days, though lactic acidosis also can contribute to his gap.  Prior to that he was likely not taking his insulin as prescribed per his sister and was definitely not taking insulin in the past 2 days.  Presentation can be consistent with starvation ketosis versus DKA, but will treat aggressively as this can contribute to his encephalopathy.  Patient's home insulin regimen as Lantus 45 to 50 units daily and Humalog 10 to 14 units 3 times daily with meals. - Start insulin drip and continue aggressive fluid resuscitation - Trend BHB, BMP  #AKI 2/2 rhabdomyolysis #Hyperkalemia #CKD Patient presents with K 5.3, isolated AST elevation at 67, bilirubin at 2.0, and lactic acidosis with CK 1261, consistent with rhabdomyolysis.  Has a history of elevated CK.  No EKG changes from hyperkalemia.  Initial  creatinine 3.24 and baseline unclear but previously noted to be approximately 2.  Rhabdo could be associated with unwitnessed seizure in the past 2 days, work-up as above. - Continue aggressive fluid resuscitation and trend BMP  #Schizoaffective disorder #Bipolar 1 disorder with prior suicide attempt #PTSD Polypharmacy due to these conditions and holding meds as above.  No acute concerns tonight,  but will likely consult psychiatry for med management and assessment tomorrow.  #Incidental Thyroid nodule -f\u TSH. Outpatient [place].  Diet: NPO VTE: Enoxaparin IVF: LR, 150 mL/h.  Can change to D5 LR as needed per Endo tool Code: DNR  Prior to Admission Living Arrangement: Home, living by himself Anticipated Discharge Location:  Likely will need SNF, pending PT/OT evaluation Barriers to Discharge: Improvement of mentation and titration of medication regimen.  Dispo: Admit patient to Inpatient with expected length of stay greater than 2 midnights.  Signed: Linus Galas, MD Internal Medicine Resident PGY-1  12/01/2021, 8:07 PM

## 2021-12-01 NOTE — ED Provider Notes (Signed)
[facility] EMERGENCY DEPARTMENT Provider Note   CSN: [id number] Arrival date & time: 12/01/21  1650     History  Chief Complaint  Patient presents with   Altered Mental Status    Last seen Friday, Social services called EMS Sunday since no one has heard from him.     [name]. is a 56 y.o. male.   Altered Mental Status    Patient is a 56 year old male, according to the medical record this patient has type 2 diabetes, he has also on Lipitor, Wellbutrin, BuSpar, Depakote, Klonopin and takes insulin, pantoprazole and tizanidine.  He was last seen by his home health nurse on Friday, they have not been able to get a hold of him for the last 2 days and had EMS to go well check.  They found him on the floor beside his bed less responsive not able to answer questions other than state his name, he was very stiff, he was cold, he was tachycardic, they were unable to do a blood sugar prehospital.  The patient is unable to verbalize to me what is going on, level 5 caveat applies  Home Medications Prior to Admission medications   Medication Sig Start Date End Date Taking? Authorizing Provider  alfuzosin (UROXATRAL) 10 MG 24 hr tablet Take 10 mg by mouth at bedtime.  12/31/19   [provider]  ARIPiprazole (ABILIFY) 5 MG tablet TAKE 1 TABLET BY MOUTH DAILY FOR DEPRESSION Patient taking differently: Take 5 mg by mouth daily. 06/23/18   [name], MD  atorvastatin (LIPITOR) 10 MG tablet Take 1 tablet (10 mg total) by mouth at bedtime. 11/19/21   [name], MD  buPROPion (WELLBUTRIN XL) 300 MG 24 hr tablet Take 1 tablet (300 mg total) by mouth daily. 10/13/16   Elgergawy, Silver Huguenin, MD  busPIRone (BUSPAR) 10 MG tablet Take 10 mg by mouth 2 (two) times daily. 07/24/20   [provider]  busPIRone (BUSPAR) 15 MG tablet Take 15 mg by mouth 2 (two) times daily.    [provider]  chlorhexidine (PERIDEX) 0.12 % solution SMARTSIG:By Mouth  09/20/20   [provider]  ciclopirox (PENLAC) 8 % solution APPLY DAILY TO NAILS. ONCE  A WEEK FILE TOENAIL AND  CLEAN WITH ALCOHOL AND  REAPPLY 04/18/21   Trula Slade, DPM  clonazePAM (KLONOPIN) 0.5 MG tablet Take 0.5 mg by mouth 2 (two) times daily as needed. 09/11/20   [provider]  Continuous Blood Gluc Receiver (FREESTYLE LIBRE READER) DEVI Use as directed 07/20/20   [name], MD  Continuous Blood Gluc Sensor (FREESTYLE LIBRE 14 DAY SENSOR) MISC Use as directed 01/22/21   [name], MD  Continuous Blood Gluc Sensor (FREESTYLE LIBRE 2 SENSOR) MISC by Does not apply route.    [provider]  divalproex (DEPAKOTE ER) 500 MG 24 hr tablet Take 500 mg by mouth daily. 01/29/20   [provider]  fenofibrate (TRICOR) 145 MG tablet TAKE 1 TABLET BY MOUTH DAILY 04/28/21   [name], MD  FLUARIX QUADRIVALENT 0.5 ML injection  04/04/20   [provider]  fluticasone (FLONASE) 50 MCG/ACT nasal spray USE 1 SPRAY IN BOTH NOSTRILS  DAILY 11/21/21   [name], MD  gabapentin (NEURONTIN) 300 MG capsule Take 1 capsule (300 mg total) by mouth 2 (two) times daily. 11/19/21   [name], MD  glucose blood (ACCU-CHEK GUIDE) test strip CHECK SUGAR FOUR TIMES DAILY 07/22/18   [name], MD  insulin glargine (LANTUS SOLOSTAR) 100 UNIT/ML Solostar Pen Inject 45-50 Units into the skin daily. 09/02/21   Philemon Kingdom, MD  insulin lispro (HUMALOG KWIKPEN) 200 UNIT/ML KwikPen Inject 10-14 Units into the skin 3 (three) times daily before meals. 09/02/21   Philemon Kingdom, MD  Insulin Pen Needle (B-D UF III MINI PEN NEEDLES) 31G X 5 MM MISC USE 4 TIMES DAILY 07/09/21   [name], MD  lisinopril (ZESTRIL) 2.5 MG tablet Take 1 tablet (2.5 mg total) by mouth daily. 11/19/21   [name], MD  lubiprostone (AMITIZA) 8 MCG capsule Take 1 capsule (8 mcg total) by mouth 2 (two) times daily with a meal. 11/19/21   [name], MD  mupirocin  ointment (BACTROBAN) 2 % Apply 1 application topically 2 (two) times daily. 05/29/20   Trula Slade, DPM  naloxone [facility]) nasal spray 4 mg/0.1 mL SMARTSIG:Both Nares 10/25/21   [provider]  oxyCODONE-acetaminophen (PERCOCET/ROXICET) 5-325 MG tablet Take by mouth. 08/28/20   [provider]  pantoprazole (PROTONIX) 40 MG tablet TAKE 1 TABLET BY MOUTH DAILY 11/21/21   [name], MD  polyethylene glycol (MIRALAX / GLYCOLAX) 17 g packet Take 17 g by mouth daily.    [provider]  QUEtiapine (SEROQUEL) 400 MG tablet Take 400 mg by mouth as needed. 01/20/20   [provider]  tadalafil (CIALIS) 5 MG tablet TAKE ONE TABLET BY MOUTH EVERY DAY FOR BPH 07/09/20   [name], MD  Testosterone 20.25 MG/ACT (1.62%) GEL APPLY TOPICALLY 1 PUMP ONCE  DAILY TO THE UPPER ARMS 10/07/21   [name], MD  tiZANidine (ZANAFLEX) 4 MG tablet TAKE 1 TABLET BY MOUTH EVERY 8  HOURS AS NEEDED FOR MUSCLE  SPASM(S) 11/21/21   [name], MD  TRADJENTA 5 MG TABS tablet Take 5 mg by mouth daily. 06/28/21   [provider]  traZODone (DESYREL) 150 MG tablet Take 1 tablet (150 mg total) by mouth at bedtime. 05/11/20   Ladell Pier, MD  varenicline (CHANTIX) 0.5 MG tablet Take 1 tablet (0.5 mg total) by mouth 2 (two) times daily. 11/29/21   [name], MD      Allergies    Vicodin [hydrocodone-acetaminophen]    Review of Systems   Review of Systems  Unable to perform ROS: Mental status change    Physical Exam Updated Vital Signs BP (!) 157/100   Pulse (!) 120   Temp 98.6 F (37 C) (Rectal)   Resp (!) 21   SpO2 100%  Physical Exam Vitals and nursing note reviewed.  Constitutional:      General: He is not in acute distress.    Appearance: He is well-developed.  HENT:     Head: Normocephalic and atraumatic.     Nose: No congestion or rhinorrhea.     Mouth/Throat:     Mouth: Mucous membranes are dry.     Pharynx: No oropharyngeal exudate.   Eyes:     General: No scleral icterus.       Right eye: No discharge.        Left eye: No discharge.     Conjunctiva/sclera: Conjunctivae normal.     Pupils: Pupils are equal, round, and reactive to light.  Neck:     Thyroid: No thyromegaly.     Vascular: No JVD.  Cardiovascular:     Rate and Rhythm: Regular rhythm. Tachycardia present.     Heart sounds: Normal heart sounds. No murmur heard.    No friction rub. No  gallop.  Pulmonary:     Effort: Pulmonary effort is normal. No respiratory distress.     Breath sounds: Normal breath sounds. No wheezing or rales.  Abdominal:     General: Bowel sounds are normal. There is no distension.     Palpations: Abdomen is soft. There is no mass.     Tenderness: There is no abdominal tenderness.  Musculoskeletal:        General: No tenderness. Normal range of motion.     Cervical back: Normal range of motion and neck supple.     Right lower leg: No edema.     Left lower leg: No edema.     Comments: Right leg seems to be slightly shorter and slightly externally rotated compared to the left but is able to fully range of motion both hips to passive range of motion, bilateral arms without obvious deformities  Lymphadenopathy:     Cervical: No cervical adenopathy.  Skin:    General: Skin is warm and dry.     Findings: No erythema or rash.     Comments: The patient is covered in thousands of tiny scratches, he also has what appears to be some pressure spots over his left hip his bilateral feet and he is covered in stool  Neurological:     Mental Status: He is alert.     Coordination: Coordination normal.     Comments: The patient will not follow commands, he appears to be shivering, he has open eyes with asymmetrical pupils right greater than left, will not open his mouth, occasionally says yes or no  Psychiatric:        Behavior: Behavior normal.     ED Results / Procedures / Treatments   Labs (all labs ordered are listed, but only abnormal  results are displayed) Labs Reviewed  COMPREHENSIVE METABOLIC PANEL - Abnormal; Notable for the following components:      Result Value   Potassium 5.3 (*)    CO2 13 (*)    Glucose, Bld 318 (*)    BUN 66 (*)    Creatinine, Ser 3.24 (*)    Calcium 10.6 (*)    Total Protein 8.3 (*)    AST 67 (*)    Total Bilirubin 2.0 (*)    GFR, Estimated 22 (*)    Anion gap 27 (*)    All other components within normal limits  CBC - Abnormal; Notable for the following components:   WBC 10.6 (*)    All other components within normal limits  LACTIC ACID, PLASMA - Abnormal; Notable for the following components:   Lactic Acid, Venous 2.9 (*)    All other components within normal limits  URINALYSIS, ROUTINE W REFLEX MICROSCOPIC - Abnormal; Notable for the following components:   Glucose, UA >=500 (*)    Hgb urine dipstick LARGE (*)    Ketones, ur 80 (*)    Protein, ur 100 (*)    Bacteria, UA RARE (*)    All other components within normal limits  AMMONIA - Abnormal; Notable for the following components:   Ammonia 44 (*)    All other components within normal limits  VALPROIC ACID LEVEL - Abnormal; Notable for the following components:   Valproic Acid Lvl <10 (*)    All other components within normal limits  CK - Abnormal; Notable for the following components:   Total CK 1,261 (*)    All other components within normal limits  CBG MONITORING, ED - Abnormal; Notable for  the following components:   Glucose-Capillary 328 (*)    All other components within normal limits  RESP PANEL BY RT-PCR (FLU A&B, COVID) ARPGX2  CULTURE, BLOOD (ROUTINE X 2)  CULTURE, BLOOD (ROUTINE X 2)  URINE CULTURE  PROTIME-INR  APTT  LACTIC ACID, PLASMA    EKG EKG Interpretation  Date/Time:  Sunday December 01 2021 16:53:05 EDT Ventricular Rate:  124 PR Interval:  151 QRS Duration: 89 QT Interval:  280 QTC Calculation: 403 R Axis:   62 Text Interpretation: Sinus tachycardia Atrial premature complexes Probable left  atrial enlargement Borderline ST depression, diffuse leads Since last tracing rate faster Confirmed by Noemi Chapel [phone]) on 12/01/2021 5:17:27 PM  Radiology CT Head Wo Contrast  Result Date: 12/01/2021 CLINICAL DATA:  Found on floor covered in feces and urine with increased confusion, initial encounter EXAM: CT HEAD WITHOUT CONTRAST CT CERVICAL SPINE WITHOUT CONTRAST TECHNIQUE: Multidetector CT imaging of the head and cervical spine was performed following the standard protocol without intravenous contrast. Multiplanar CT image reconstructions of the cervical spine were also generated. RADIATION DOSE REDUCTION: This exam was performed according to the departmental dose-optimization program which includes automated exposure control, adjustment of the mA and/or kV according to patient size and/or use of iterative reconstruction technique. COMPARISON:  01/01/2020 FINDINGS: CT HEAD FINDINGS Brain: No evidence of acute infarction, hemorrhage, hydrocephalus, extra-axial collection or mass lesion/mass effect. Chronic atrophic changes are noted. Vascular: No hyperdense vessel or unexpected calcification. Skull: Normal. Negative for fracture or focal lesion. Sinuses/Orbits: No acute finding. Other: None. CT CERVICAL SPINE FINDINGS Alignment: Within normal limits. Skull base and vertebrae: 7 cervical segments are well visualized. Vertebral body height is well maintained. Disc space narrowing is noted at C5-6. Multilevel osteophytic changes are noted both anteriorly and posteriorly extending from C3 to C7. Mild facet hypertrophic changes are seen. No acute fracture or acute facet abnormality is noted. Soft tissues and spinal canal: Surrounding soft tissue structures show no focal hematoma or lymphadenopathy. The thyroid demonstrates scattered hypodense nodules measuring up to 15 mm. Upper chest: Visualized lung apices show mild emphysematous change. Other: None IMPRESSION: CT of the head: Chronic atrophic changes  without acute abnormality. CT of cervical spine: Multilevel degenerative changes of the cervical spine are seen without acute bony abnormality. 1.5 cm incidental thyroid nodule. Recommend nonemergent thyroid US. Reference: J Am Coll Radiol. 2015 Feb;12(2): 143-50 Electronically Signed   By: Inez Catalina M.D.   On: 12/01/2021 19:35   CT Cervical Spine Wo Contrast  Result Date: 12/01/2021 CLINICAL DATA:  Found on floor covered in feces and urine with increased confusion, initial encounter EXAM: CT HEAD WITHOUT CONTRAST CT CERVICAL SPINE WITHOUT CONTRAST TECHNIQUE: Multidetector CT imaging of the head and cervical spine was performed following the standard protocol without intravenous contrast. Multiplanar CT image reconstructions of the cervical spine were also generated. RADIATION DOSE REDUCTION: This exam was performed according to the departmental dose-optimization program which includes automated exposure control, adjustment of the mA and/or kV according to patient size and/or use of iterative reconstruction technique. COMPARISON:  01/01/2020 FINDINGS: CT HEAD FINDINGS Brain: No evidence of acute infarction, hemorrhage, hydrocephalus, extra-axial collection or mass lesion/mass effect. Chronic atrophic changes are noted. Vascular: No hyperdense vessel or unexpected calcification. Skull: Normal. Negative for fracture or focal lesion. Sinuses/Orbits: No acute finding. Other: None. CT CERVICAL SPINE FINDINGS Alignment: Within normal limits. Skull base and vertebrae: 7 cervical segments are well visualized. Vertebral body height is well maintained. Disc space narrowing is noted at  C5-6. Multilevel osteophytic changes are noted both anteriorly and posteriorly extending from C3 to C7. Mild facet hypertrophic changes are seen. No acute fracture or acute facet abnormality is noted. Soft tissues and spinal canal: Surrounding soft tissue structures show no focal hematoma or lymphadenopathy. The thyroid demonstrates  scattered hypodense nodules measuring up to 15 mm. Upper chest: Visualized lung apices show mild emphysematous change. Other: None IMPRESSION: CT of the head: Chronic atrophic changes without acute abnormality. CT of cervical spine: Multilevel degenerative changes of the cervical spine are seen without acute bony abnormality. 1.5 cm incidental thyroid nodule. Recommend nonemergent thyroid US. Reference: J Am Coll Radiol. 2015 Feb;12(2): 143-50 Electronically Signed   By: Inez Catalina M.D.   On: 12/01/2021 19:35   DG HIP UNILAT WITH PELVIS 2-3 VIEWS LEFT  Result Date: 12/01/2021 CLINICAL DATA:  Questionable sepsis, evaluate for abnormality EXAM: DG HIP (WITH OR WITHOUT PELVIS) 2-3V LEFT COMPARISON:  Radiographs 04/23/2019 FINDINGS: Left THA.  No evidence of loosening.  No fracture or dislocation. IMPRESSION: No acute osseous abnormality. Electronically Signed   By: Placido Sou M.D.   On: 12/01/2021 18:59   DG HIP UNILAT WITH PELVIS 2-3 VIEWS RIGHT  Result Date: 12/01/2021 CLINICAL DATA:  Fall EXAM: DG HIP (WITH OR WITHOUT PELVIS) 2-3V RIGHT COMPARISON:  None Available. FINDINGS: No recent fracture or dislocation is seen. There is previous arthroplasty in both hips. There is surgical fusion at the L4-L5 level in lumbar spine. IMPRESSION: No fracture or dislocation is seen pelvis and right hip. Previous arthroplasty in both hips. Electronically Signed   By: Elmer Picker M.D.   On: 12/01/2021 18:58   DG Chest Port 1 View  Result Date: 12/01/2021 CLINICAL DATA:  Possible sepsis EXAM: PORTABLE CHEST 1 VIEW COMPARISON:  01/01/2020 FINDINGS: The heart size and mediastinal contours are within normal limits. Both lungs are clear. The visualized skeletal structures are unremarkable. IMPRESSION: No active disease. Electronically Signed   By: Elmer Picker M.D.   On: 12/01/2021 18:56    Procedures .Critical Care  Performed by: Noemi Chapel, MD Authorized by: Noemi Chapel, MD   Critical care  provider statement:    Critical care time (minutes):  30   Critical care time was exclusive of:  Separately billable procedures and treating other patients and teaching time   Critical care was necessary to treat or prevent imminent or life-threatening deterioration of the following conditions:  Renal failure, dehydration, CNS failure or compromise and metabolic crisis   Critical care was time spent personally by me on the following activities:  Development of treatment plan with patient or surrogate, discussions with consultants, evaluation of patient's response to treatment, examination of patient, ordering and review of laboratory studies, ordering and review of radiographic studies, ordering and performing treatments and interventions, pulse oximetry, re-evaluation of patient's condition, review of old charts and obtaining history from patient or surrogate   I assumed direction of critical care for this patient from another provider in my specialty: no     Care discussed with: admitting provider   Comments:            Medications Ordered in ED Medications  lactated ringers infusion ( Intravenous New Bag/Given 12/01/21 1750)  cefTRIAXone (ROCEPHIN) 2 g in sodium chloride 0.9 % 100 mL IVPB (0 g Intravenous Stopped 12/01/21 1825)  azithromycin (ZITHROMAX) 500 mg in sodium chloride 0.9 % 250 mL IVPB (0 mg Intravenous Stopped 12/01/21 1928)  sodium chloride 0.9 % bolus 1,000 mL (has no administration  in time range)  LORazepam (ATIVAN) injection 1 mg (has no administration in time range)  valproate (DEPACON) 500 mg in dextrose 5 % 50 mL IVPB (has no administration in time range)  sodium chloride 0.9 % bolus 1,000 mL (0 mLs Intravenous Stopped 12/01/21 1845)    ED Course/ Medical Decision Making/ A&P                           Medical Decision Making Amount and/or Complexity of Data Reviewed Labs: ordered. Radiology: ordered. ECG/medicine tests: ordered.  Risk Prescription drug  management. Decision regarding hospitalization.   This patient presents to the ED for concern of mental status, weakness, fall, this involves an extensive number of treatment options, and is a complaint that carries with it a high risk of complications and morbidity.  The differential diagnosis includes stroke, sepsis, fracture of the hip, hypoglycemia, Depakote toxicity, hypothermia, rhabdomyolysis   Co morbidities that complicate the patient evaluation  Somnolent, altered Diabetic   Additional history obtained:  Additional history obtained from electronic medical record External records from outside source obtained and reviewed including followed by family doctor for diabetes   Lab Tests:  I Ordered, and personally interpreted labs.  The pertinent results include: Acute kidney injury, renal failure, dehydration, acidosis   Imaging Studies ordered:  I ordered imaging studies including CT scan of the brain and cervical spine as well as a chest x-ray I independently visualized and interpreted imaging which showed chronic ex-vacuo appearance of the brain, no fractures, no pneumonia I agree with the radiologist interpretation   Cardiac Monitoring: / EKG:  The patient was maintained on a cardiac monitor.  I personally viewed and interpreted the cardiac monitored which showed an underlying rhythm of: Persistent sinus tachycardia   Consultations Obtained:  I requested consultation with the Neurologist Dr. Rory Percy,  and discussed lab and imaging findings as well as pertinent plan - they recommend: they will come to see the patient Internal medicine resident teaching service consulted for admission, they will come to see for admission to higher level of care   Problem List / ED Course / Critical interventions / Medication management  The patient is critically ill, likely needs a stepdown unit, persistent tachycardia dehydration renal failure, no obvious signs of infection present,  would also consider DKA but after IV fluids will reassess sugar and add insulin as needed, this was discussed with the resident service and they will continue this treatment as well I ordered medication including IV fluids and antibiotics for potential sepsis Reevaluation of the patient after these medicines showed that the patient no change I have reviewed the patients home medicines and have made adjustments as needed   Social Determinants of Health:  Cognitive problems at baseline Polypharmacy Prior history of substance abuse Unresponsive   Test / Admission - Considered:  Admit to the hospital to high level of care         Final Clinical Impression(s) / ED Diagnoses Final diagnoses:  Acute encephalopathy  AKI (acute kidney injury) ([place])  Traumatic rhabdomyolysis, initial encounter [facility])  Dehydration    Rx / DC Orders ED Discharge Orders     None         Noemi Chapel, MD 12/01/21 2010

## 2021-12-01 NOTE — ED Notes (Signed)
RN and tec arranged pt in the bed so that he was more comfortable.  Pt did look at [place] and said "Hi" to his daughter.  When asked if he knew where he was by his daughter he said "no".  Pt unable to say any thing else.

## 2021-12-01 NOTE — Hospital Course (Addendum)
8/31 - Largely nonverbal today  At time of hospital follow-up visit, please address the following: ***  ---------------------------------------  [name]. is a 56 y.o. male with PMHx of PTSD, bipolar disorder, schizoaffective disorder, questionable seizures, medication-induced tremors, substance abuse, dementia vs pseudodementia in the setting of his psychiatric history and polypharmacy, T2DM, CKD stage 4, HTN, and peripheral neuropathy who presented to the ED on 12/01/2021 with altered mental status. He was last known to be at his baseline on 11/29/2021.   Patient's sister [name]) provided history. She is one of his powers of attorney.    Acute encephalopathy, likely toxic-metabolic Suspected psychotropic withdrawal Psychiatric comorbidities with polypharmacy Patient has multiple psychiatric diagnoses as noted above and is on many psychotropic medications including klonopin, abilify, wellbutrin, buspar, desyrel, seroquel, depakote, neurontin, and percocet. [name] is unsure about his seizure history and has never seen him have a seizure. [name] has most of his medications in her house and she provides him with a filled pill organizer every week. The two medications she does not fill are his klonopin and percocet; he was prescribed these as PRN and has these bottles in his own house. [name] is unsure how many of these PRNs he actually takes. She notes that even when she fills the pill organizer, he sometimes has medications left over when she comes back to pick it up. Patient has a home health aide who sees him on the weekdays but cannot dispense medications.   [name] last heard from him on Friday, 8/11 when she called him, and at that time he seemed to be at his baseline. His home health aide had also seen him on 8/11. [name] tried calling him on 8/12 but he did not pick up. On 8/13, she was informed that his neighbor had not seen him either so she called for a well-person check. EMS found the patient  unresponsive on the ground, lying in his own feces and urine. He was subsequently taken to the ED where he was found to have mild leukocytosis, elevated CK, hyperglycemia, and with anion gap metabolic acidosis. He was persistently hypertensive, sinus tachycardic, and tachypneic. He was also intermittently febrile up to 103 F. Neurology and psychiatry were consulted and his home medications were held. See neurology consult note for full neuro exam; findings included spontaneous movement of all four extremities but inability to follow commands or vocalize, bilateral pupil dilation to 5 mm, globally tremulous with bilateral arm tremors worsening with passive lifting, bilateral arm rigidity, and grossly intact reflexes.   Of note, [name] reports patient has experienced a few episodes of decreased responsiveness in the past which would last for 1-2 days maximum and seemed to correlate with him not taking his medications. However, his presentation this admission was much more severe and prolonged than any prior episode. He has previously never had difficulty following commands, interacting with the medical interview and exam, or other similar tasks.   Differential for patient's encephalopathy was initially very broad, particularly in the setting of his diabetic ketoacidosis and rhabdomyolysis. However, his mental status did not improve with resolution of his acid-base disorder or CK elevation. Head CT and brain MRI were unremarkable. UDS was negative except for tramadol. Continuous EEG was consistent with acute encephalopathy, likely toxic-metabolic, and showed no seizures. He was briefly on antibiotics for fevers, but antibiotics were stopped because there was low suspicion for infection - RPP was negative; CXR showed L base atelectasis but no edema or focal consolidation; CBC, BMP, and hepatic  function panel were unrevealing with no leukocytosis; prior BCx from 8/13 grew Staph haemolyticus and staph epidermidis in 2/4  bottles which was likely contaminant; repeat BCx on 8/16 had no growth; per wound care note he had stage 2 pressure wounds to the sacrum, R hip, and R elbow but no open wounds.  Rather, patient showed gradual improvement in his mental status after being on IV ativan for several days. We therefore suspected that the underlying etiology of his presentation was drug or substance overdose/ toxicity/ withdrawal in the setting of his polypharmacy. ***  *** (Psych, restarting meds)  Dysphagia Speech-language and nutrition were consulted on 8/17 since patient had been NPO for several days and was unable to clear secretions in the setting of his acute encephalopathy. Per their evaluation on 8/17 and 8/18, he was still unable to tolerate PO intake so he was started on tube feeds. ***  Anion gap metabolic acidosis Hyperglycemia History of T2DM Patient's A1c on 8/14 was 8.0, improved from 9.4 3 months ago. He is on insulin at home but he administers it himself so [name] is unsure how much he takes. On hospital presentation he was found to have hyperglycemia, ketonuria, elevated lactate, and anion gap of 27. He was suspected to be in DKA. Calculated delta-delta was -1 so there may have been an element of concomitant normal gap metabolic acidosis. He also had rhabdomyolysis as noted below which could have been contributing. He was started on insulin drip and fluids.   Patient's acidosis resolved by 8/15 but he remained NPO as described above and he was also on D5W 8/16 and 8/17 to treat his hypernatremia; this likely exacerbated his hyperglycemia. He was placed on semglee along with novolog SSI. ***   Rhabdomyolysis AKI on CKD stage 4 CK on admission was 1261 and Cr was 2.70; patient's baseline Cr was previously noted to be around 2. His rhabdomyolysis was likely caused by being on the ground for up to 2 days; continuous EEG in the hospital had no signs of seizure as noted above. AKI was suspected to be prerenal  given his lack of PO intake while on the ground. CK subsequently normalized in the next 1-2 days with fluids. Cr also downtrended and returned to baseline by 8/18. ***  Electrolyte derangements Patient was hypokalemic and hypernatremic on hospital presentation. While his potassium improved with repletion, he remained hypernatremic the next few days and was given D5W. Sodium normalized on 8/18. ***  History of thyroid nodules Per his charts, patient has known thyroid nodules which have been seen on imaging. His TSH this admission was normal.   Code status [name] believes that patient has completed an advanced care planning document in the past but she is unsure. From prior discussions with him, she believes that he would prefer to be DNR. Patient's code status was updated to DNR when he was admitted on 8/13.   ---------------------------------------  Subjective ***  Objective ***

## 2021-12-01 NOTE — Sepsis Progress Note (Signed)
Sepsis protocol is being followed by [facility]. 

## 2021-12-01 NOTE — ED Notes (Signed)
Sister at bedside.  Pt responding to sister.

## 2021-12-01 NOTE — ED Notes (Signed)
Pt to MRI w/ daughter

## 2021-12-01 NOTE — Consult Note (Signed)
Neurology Consultation  Reason for Consult: AMS Referring Physician: Dr Noemi Chapel  CC: AMS  History is obtained from: chart, sister  HPI: [name]. is a 56 y.o. male past medical history of PTSD, bipolar disorder, dementia with question of probable pseudodementia in the setting of multiple psychiatric conditions, no documented history of seizures but family thinks he might have seizures-on Depakote, medication induced tremors, diabetes, hypertension, substance abuse, schizoaffective disorder, peripheral neuropathy, hypogonadism-presented to the emergency room for emergent evaluation after having been found down on a welfare check.  He was last seen in his usual state of health on Friday, 11/29/2021 by his home health nurse.  He has an extremely poor baseline and requires help with multiple ADLs and family and home health aide helps.  Sister had not heard from him in 2 days and a welfare check was performed and he was found down on the ground, minimally responsive shivering, with urinary and bowel incontinence. He is unable to provide any history. Sister was at bedside.  She does not believe he has taken his medications over the last day or 2 since nobody had heard from him.  Chart review reveals multiple psychiatric medications including aripiprazole, bupropion, buspirone, clonazepam, gabapentin, valproate, Seroquel.  He is also on muscle relaxants-tizanidine.  Also on trazodone.,   LKW: Sometime on Friday IV thrombolysis given?: no, outside the window Premorbid modified Rankin scale (mRS):3  ROS: Full ROS was performed and is negative except as noted in the HPI.  Past Medical History:  Diagnosis Date   ADD (attention deficit disorder)    Anxiety    Arthritis    right hip   Bipolar 1 disorder (HCC)    Blood in urine    CKD (chronic kidney disease), stage III (HCC)    Creatinine elevation    Dementia ([place])    "early onset" (08/04/2017)   Depression    bipolar [facility]   Diabetes mellitus without complication ([place])    Family history of anesthesia complication    pt is unsure , but pt father may have been difficult to arouse    HCAP (healthcare-associated pneumonia) 10/31/2012   History of kidney stones    Hypertension    Hypogonadism male    Liver fatty degeneration    Microscopic hematuria    hereditary s/p Urology eval   Neuromuscular disorder ([place])    feet neuropathy    Osteoarthritis of right hip 11/28/2011   2012 2015 s/p THR Severe  Dr Novella Olive     Pleural effusion 11/02/2012   Pneumonia 10-2012   Pneumonia, organism unspecified([place]) 11/02/2012   Polysubstance dependence, non-opioid, in remission ([place])    remote   Primary osteoarthritis of left hip 05/22/2015   PTSD (post-traumatic stress disorder)    SOCIAL ANXIETY DISORDER    Schizoaffective disorder ([place])    Substance abuse ([place])    Suicide attempt by multiple drug overdose ([place]) 01/22/16   Grieving his cat's death Jul 30, 2015     Family History  Problem Relation Age of Onset   Diabetes Father    Cancer Mother        died of melanoma with mets   Cervical cancer Sister    Diabetes Sister    Other Neg Hx        hypogonadism   Colon cancer Neg Hx    Colon polyps Neg Hx    Esophageal cancer Neg Hx    Rectal cancer Neg Hx    Stomach cancer Neg Hx  Social History:   reports that he quit smoking about 2 years ago. His smoking use included cigarettes. He has never used smokeless tobacco. He reports that he does not currently use alcohol. He reports that he does not use drugs.  Medications  Current Facility-Administered Medications:    azithromycin (ZITHROMAX) 500 mg in sodium chloride 0.9 % 250 mL IVPB, 500 mg, Intravenous, Q24H, Noemi Chapel, MD, Stopped at 12/01/21 1928   cefTRIAXone (ROCEPHIN) 2 g in sodium chloride 0.9 % 100 mL IVPB, 2 g, Intravenous, Q24H, Noemi Chapel, MD, Stopped at 12/01/21 1825   lactated ringers infusion, , Intravenous, Continuous, Noemi Chapel, MD,  Last Rate: 150 mL/hr at 12/01/21 1750, New Bag at 12/01/21 1750   sodium chloride 0.9 % bolus 1,000 mL, 1,000 mL, Intravenous, Once, Noemi Chapel, MD  Current Outpatient Medications:    alfuzosin (UROXATRAL) 10 MG 24 hr tablet, Take 10 mg by mouth at bedtime. , Disp: , Rfl:    ARIPiprazole (ABILIFY) 5 MG tablet, TAKE 1 TABLET BY MOUTH DAILY FOR DEPRESSION (Patient taking differently: Take 5 mg by mouth daily.), Disp: 30 tablet, Rfl: 0   atorvastatin (LIPITOR) 10 MG tablet, Take 1 tablet (10 mg total) by mouth at bedtime., Disp: 90 tablet, Rfl: 1   buPROPion (WELLBUTRIN XL) 300 MG 24 hr tablet, Take 1 tablet (300 mg total) by mouth daily., Disp: 30 tablet, Rfl: 0   busPIRone (BUSPAR) 10 MG tablet, Take 10 mg by mouth 2 (two) times daily., Disp: , Rfl:    busPIRone (BUSPAR) 15 MG tablet, Take 15 mg by mouth 2 (two) times daily., Disp: , Rfl:    chlorhexidine (PERIDEX) 0.12 % solution, SMARTSIG:By Mouth, Disp: , Rfl:    ciclopirox (PENLAC) 8 % solution, APPLY DAILY TO NAILS. ONCE  A WEEK FILE TOENAIL AND  CLEAN WITH ALCOHOL AND  REAPPLY, Disp: 13.2 mL, Rfl: 0   clonazePAM (KLONOPIN) 0.5 MG tablet, Take 0.5 mg by mouth 2 (two) times daily as needed., Disp: , Rfl:    Continuous Blood Gluc Receiver (FREESTYLE LIBRE READER) DEVI, Use as directed, Disp: 1 each, Rfl: each   Continuous Blood Gluc Sensor (FREESTYLE LIBRE 14 DAY SENSOR) MISC, Use as directed, Disp: 2 each, Rfl: 4   Continuous Blood Gluc Sensor (FREESTYLE LIBRE 2 SENSOR) MISC, by Does not apply route., Disp: , Rfl:    divalproex (DEPAKOTE ER) 500 MG 24 hr tablet, Take 500 mg by mouth daily., Disp: , Rfl:    fenofibrate (TRICOR) 145 MG tablet, TAKE 1 TABLET BY MOUTH DAILY, Disp: 90 tablet, Rfl: 3   FLUARIX QUADRIVALENT 0.5 ML injection, , Disp: , Rfl:    fluticasone (FLONASE) 50 MCG/ACT nasal spray, USE 1 SPRAY IN BOTH NOSTRILS  DAILY, Disp: 32 g, Rfl: 1   gabapentin (NEURONTIN) 300 MG capsule, Take 1 capsule (300 mg total) by mouth 2 (two)  times daily., Disp: 180 capsule, Rfl: 1   glucose blood (ACCU-CHEK GUIDE) test strip, CHECK SUGAR FOUR TIMES DAILY, Disp: 100 each, Rfl: 12   insulin glargine (LANTUS SOLOSTAR) 100 UNIT/ML Solostar Pen, Inject 45-50 Units into the skin daily., Disp: 45 mL, Rfl: 3   insulin lispro (HUMALOG KWIKPEN) 200 UNIT/ML KwikPen, Inject 10-14 Units into the skin 3 (three) times daily before meals., Disp: 18 mL, Rfl: 3   Insulin Pen Needle (B-D UF III MINI PEN NEEDLES) 31G X 5 MM MISC, USE 4 TIMES DAILY, Disp: 120 each, Rfl: 12   lisinopril (ZESTRIL) 2.5 MG tablet, Take 1 tablet (2.5  mg total) by mouth daily., Disp: 90 tablet, Rfl: 1   lubiprostone (AMITIZA) 8 MCG capsule, Take 1 capsule (8 mcg total) by mouth 2 (two) times daily with a meal., Disp: 60 capsule, Rfl: 3   mupirocin ointment (BACTROBAN) 2 %, Apply 1 application topically 2 (two) times daily., Disp: 30 g, Rfl: 2   naloxone (NARCAN) nasal spray 4 mg/0.1 mL, SMARTSIG:Both Nares, Disp: , Rfl:    oxyCODONE-acetaminophen (PERCOCET/ROXICET) 5-325 MG tablet, Take by mouth., Disp: , Rfl:    pantoprazole (PROTONIX) 40 MG tablet, TAKE 1 TABLET BY MOUTH DAILY, Disp: 100 tablet, Rfl: 2   polyethylene glycol (MIRALAX / GLYCOLAX) 17 g packet, Take 17 g by mouth daily., Disp: , Rfl:    QUEtiapine (SEROQUEL) 400 MG tablet, Take 400 mg by mouth as needed., Disp: , Rfl:    tadalafil (CIALIS) 5 MG tablet, TAKE ONE TABLET BY MOUTH EVERY DAY FOR BPH, Disp: 90 tablet, Rfl: 1   Testosterone 20.25 MG/ACT (1.62%) GEL, APPLY TOPICALLY 1 PUMP ONCE  DAILY TO THE UPPER ARMS, Disp: 75 g, Rfl: 0   tiZANidine (ZANAFLEX) 4 MG tablet, TAKE 1 TABLET BY MOUTH EVERY 8  HOURS AS NEEDED FOR MUSCLE  SPASM(S), Disp: 90 tablet, Rfl: 1   TRADJENTA 5 MG TABS tablet, Take 5 mg by mouth daily., Disp: , Rfl:    traZODone (DESYREL) 150 MG tablet, Take 1 tablet (150 mg total) by mouth at bedtime., Disp: 90 tablet, Rfl: 1   varenicline (CHANTIX) 0.5 MG tablet, Take 1 tablet (0.5 mg total) by mouth  2 (two) times daily., Disp: 60 tablet, Rfl: 2   Exam: Current vital signs: BP (!) 138/113   Pulse (!) 120   Temp 98.6 F (37 C) (Rectal)   Resp (!) 25   SpO2 100%  Vital signs in last 24 hours: Temp:  [98.6 F (37 C)] 98.6 F (37 C) (08/13 1656) Pulse Rate:  [120-130] 120 (08/13 1815) Resp:  [21-25] 25 (08/13 1815) BP: (138-168)/(100-114) 138/113 (08/13 1815) SpO2:  [96 %-100 %] 100 % (08/13 1815) General: Extremely drowsy, opens eyes to voice, does not follow commands HEENT: Normocephalic atraumatic Lungs: Clear Cardiovascular: Regular rhythm, tachycardic Abdomen nondistended nontender Extremities with multiple scratch marks on the legs and inflamed great toes. Neurological exam Extremely drowsy, opens eyes to voice.  Does not follow commands. Nonverbal Cranial nerves: Pupils are equal round react light, extraocular movements difficult to ascertain but has positive oculocephalics, corneals present, does not blink to threat from either side, face appears grossly symmetric. Motor examination with spontaneous movement of all 4 extremities and to noxious stimulation strong withdrawal.  He resists passively lifting his arms and legs with good strength.  Mild increased tone in all extremities. Sensory exam: As above Coordination cannot be assessed due to his mentation.  Generally tremulous with somewhat asymmetric resting tremor in the left upper extremity compared to the right DTR: diminished to absent reflexes all over. No clonus. Mute toes   Labs I have reviewed labs in epic and the results pertinent to this consultation are: CBC    Component Value Date/Time   WBC 10.6 (H) 12/01/2021 1740   RBC 5.33 12/01/2021 1740   HGB 15.3 12/01/2021 1740   HGB 14.1 11/19/2021 1640   HCT 45.8 12/01/2021 1740   HCT 40.5 11/19/2021 1640   PLT 222 12/01/2021 1740   PLT 140 (L) 11/19/2021 1640   MCV 85.9 12/01/2021 1740   MCV 84 11/19/2021 1640   MCH 28.7 12/01/2021 1740  MCHC 33.4  12/01/2021 1740   RDW 13.0 12/01/2021 1740   RDW 13.3 11/19/2021 1640   LYMPHSABS 1.7 11/19/2021 1640   MONOABS 0.7 06/16/2021 1320   EOSABS 0.0 11/19/2021 1640   BASOSABS 0.0 11/19/2021 1640    CMP     Component Value Date/Time   NA 141 12/01/2021 1740   NA 135 11/19/2021 1640   K 5.3 (H) 12/01/2021 1740   CL 101 12/01/2021 1740   CO2 13 (L) 12/01/2021 1740   GLUCOSE 318 (H) 12/01/2021 1740   BUN 66 (H) 12/01/2021 1740   BUN 33 (H) 11/19/2021 1640   CREATININE 3.24 (H) 12/01/2021 1740   CALCIUM 10.6 (H) 12/01/2021 1740   PROT 8.3 (H) 12/01/2021 1740   PROT 7.7 11/19/2021 1640   ALBUMIN 4.4 12/01/2021 1740   ALBUMIN 4.8 11/19/2021 1640   AST 67 (H) 12/01/2021 1740   ALT 44 12/01/2021 1740   ALKPHOS 59 12/01/2021 1740   BILITOT 2.0 (H) 12/01/2021 1740   BILITOT 0.4 11/19/2021 1640   GFRNONAA 22 (L) 12/01/2021 1740   GFRAA >60 01/04/2020 0844    Lipid Panel     Component Value Date/Time   CHOL 167 07/06/2020 1116   TRIG 323 (H) 07/06/2020 1116   HDL 39 (L) 07/06/2020 1116   CHOLHDL 4.3 07/06/2020 1116   CHOLHDL 7 06/02/2016 0844   VLDL 41 (H) 10/03/2015 0434   LDLCALC 76 07/06/2020 1116   LDLDIRECT 72.0 06/02/2016 0844   Ammonia mildly elevated at 44 VPA <10  Imaging I have reviewed the images obtained:  CT-head-unremarkable for acute process.  There is a prominence of his basilar which appears unchanged compared to prior scans-likely dolichoectatic vessel with calcification  CT of the C-spine: Chronic multilevel degenerative changes without any acute abnormality.  Assessment:  56 year old with a significant psychiatric past medical history presenting for evaluation of altered mental status after having been found down with last known well 2 days ago.  No witnessed seizures although he was found in a pool of his urine and feces.  Unclear cause of current presentation. On multiple sedating medications for his extensive psychiatric history which probably is  contributing.  Needs further work-up for an infectious source although has mild leukocytosis. Also needs further brain imaging and EEG to rule out seizures although there is no history of seizures there are multiple medications that could lower seizure threshold and including withdrawal from benzodiazepines-has lorazepam listed on chart which she has not been taking. Toxicity esp from multiple meds including psych meds and Neurontin could also be playing a role. Other differentials would also include serotonin syndrome.  Impression: Toxic metabolic encephalopathy Evaluate for serotonin syndrome Polypharmacy Drug induced tremors at baseline  Recommendations: -Supportive care per ER and medicine for presumed sepsis -I would give him some benzodiazepines IV since he has been on Klonopin p.o. and we do not know when the last dose was. Ativan '1mg'$  IV q6h for the next day or so till he is able to take PO. -Hold neurontin -Hold Abilify, trazodone, Wellbutrin and Buspar. -Continue seroquel but at a lower dose if he is able to take PO. Can do 50 mg qHS. -Hold tizanidine -valproate undetectable - likely not taking. Hold for now as ammonia is mildly elevated. -Will need psychiatry to optimize medications once stabilized-please consult psychiatry in the AM for a routine consult. -Do not think he is actively seizing - no need for stat EEG. Can be done routine in the AM. -MR brain w/o  Plan d/w admitting resident physicians from teaching service at bedside.  Plan also d/w Dr. Sabra Heck and patient's sister at bedside.  -- Amie Portland, MD Neurologist [facility] Pager: [phone]

## 2021-12-02 ENCOUNTER — Other Ambulatory Visit: Payer: Self-pay

## 2021-12-02 ENCOUNTER — Inpatient Hospital Stay (HOSPITAL_COMMUNITY): Payer: Medicare Other

## 2021-12-02 DIAGNOSIS — G9340 Encephalopathy, unspecified: Secondary | ICD-10-CM | POA: Diagnosis not present

## 2021-12-02 DIAGNOSIS — R4182 Altered mental status, unspecified: Secondary | ICD-10-CM | POA: Diagnosis not present

## 2021-12-02 DIAGNOSIS — R4020 Unspecified coma: Secondary | ICD-10-CM | POA: Diagnosis not present

## 2021-12-02 DIAGNOSIS — E1111 Type 2 diabetes mellitus with ketoacidosis with coma: Secondary | ICD-10-CM | POA: Diagnosis not present

## 2021-12-02 DIAGNOSIS — E8721 Acute metabolic acidosis: Secondary | ICD-10-CM

## 2021-12-02 DIAGNOSIS — G9349 Other encephalopathy: Secondary | ICD-10-CM | POA: Diagnosis not present

## 2021-12-02 DIAGNOSIS — E1110 Type 2 diabetes mellitus with ketoacidosis without coma: Secondary | ICD-10-CM | POA: Insufficient documentation

## 2021-12-02 DIAGNOSIS — N179 Acute kidney failure, unspecified: Secondary | ICD-10-CM | POA: Diagnosis not present

## 2021-12-02 DIAGNOSIS — E1122 Type 2 diabetes mellitus with diabetic chronic kidney disease: Secondary | ICD-10-CM

## 2021-12-02 LAB — BLOOD CULTURE ID PANEL (REFLEXED) - BCID2

## 2021-12-02 LAB — CBG MONITORING, ED
Glucose-Capillary: 154 mg/dL — ABNORMAL HIGH (ref 70–99)
Glucose-Capillary: 166 mg/dL — ABNORMAL HIGH (ref 70–99)
Glucose-Capillary: 180 mg/dL — ABNORMAL HIGH (ref 70–99)
Glucose-Capillary: 185 mg/dL — ABNORMAL HIGH (ref 70–99)
Glucose-Capillary: 194 mg/dL — ABNORMAL HIGH (ref 70–99)
Glucose-Capillary: 200 mg/dL — ABNORMAL HIGH (ref 70–99)
Glucose-Capillary: 221 mg/dL — ABNORMAL HIGH (ref 70–99)
Glucose-Capillary: 223 mg/dL — ABNORMAL HIGH (ref 70–99)
Glucose-Capillary: 224 mg/dL — ABNORMAL HIGH (ref 70–99)
Glucose-Capillary: 259 mg/dL — ABNORMAL HIGH (ref 70–99)
Glucose-Capillary: 272 mg/dL — ABNORMAL HIGH (ref 70–99)
Glucose-Capillary: 300 mg/dL — ABNORMAL HIGH (ref 70–99)
Glucose-Capillary: 338 mg/dL — ABNORMAL HIGH (ref 70–99)
Glucose-Capillary: 551 mg/dL (ref 70–99)
Glucose-Capillary: 71 mg/dL (ref 70–99)
Glucose-Capillary: >600 mg/dL (ref 70–99)

## 2021-12-02 LAB — BASIC METABOLIC PANEL
Anion gap: 12 (ref 5–15)
BUN: 51 mg/dL — ABNORMAL HIGH (ref 6–20)
CO2: 16 mmol/L — ABNORMAL LOW (ref 22–32)
Calcium: 7.8 mg/dL — ABNORMAL LOW (ref 8.9–10.3)
Chloride: 119 mmol/L — ABNORMAL HIGH (ref 98–111)
Creatinine, Ser: 1.97 mg/dL — ABNORMAL HIGH (ref 0.61–1.24)
GFR, Estimated: 40 mL/min — ABNORMAL LOW (ref >60)
Glucose, Bld: 165 mg/dL — ABNORMAL HIGH (ref 70–99)
Potassium: 3.8 mmol/L (ref 3.5–5.1)
Sodium: 147 mmol/L — ABNORMAL HIGH (ref 135–145)

## 2021-12-02 LAB — COMPREHENSIVE METABOLIC PANEL
ALT: 38 U/L (ref 0–44)
AST: 55 U/L — ABNORMAL HIGH (ref 15–41)
Albumin: 3.8 g/dL (ref 3.5–5.0)
Alkaline Phosphatase: 47 U/L (ref 38–126)
Anion gap: 16 — ABNORMAL HIGH (ref 5–15)
BUN: 63 mg/dL — ABNORMAL HIGH (ref 6–20)
CO2: 20 mmol/L — ABNORMAL LOW (ref 22–32)
Calcium: 10.1 mg/dL (ref 8.9–10.3)
Chloride: 110 mmol/L (ref 98–111)
Creatinine, Ser: 2.7 mg/dL — ABNORMAL HIGH (ref 0.61–1.24)
GFR, Estimated: 27 mL/min — ABNORMAL LOW (ref >60)
Glucose, Bld: 185 mg/dL — ABNORMAL HIGH (ref 70–99)
Potassium: 4.4 mmol/L (ref 3.5–5.1)
Sodium: 146 mmol/L — ABNORMAL HIGH (ref 135–145)
Total Bilirubin: 1.1 mg/dL (ref 0.3–1.2)
Total Protein: 7.2 g/dL (ref 6.5–8.1)

## 2021-12-02 LAB — GLUCOSE, CAPILLARY
Glucose-Capillary: 388 mg/dL — ABNORMAL HIGH (ref 70–99)
Glucose-Capillary: 149 mg/dL — ABNORMAL HIGH (ref 70–99)
Glucose-Capillary: 125 mg/dL — ABNORMAL HIGH (ref 70–99)
Glucose-Capillary: 171 mg/dL — ABNORMAL HIGH (ref 70–99)
Glucose-Capillary: 201 mg/dL — ABNORMAL HIGH (ref 70–99)
Glucose-Capillary: 231 mg/dL — ABNORMAL HIGH (ref 70–99)
Glucose-Capillary: 225 mg/dL — ABNORMAL HIGH (ref 70–99)

## 2021-12-02 LAB — RAPID URINE DRUG SCREEN, HOSP PERFORMED
Amphetamines: NOT DETECTED
Barbiturates: NOT DETECTED
Benzodiazepines: NOT DETECTED
Cocaine: NOT DETECTED
Opiates: NOT DETECTED
Tetrahydrocannabinol: NOT DETECTED

## 2021-12-02 LAB — SARS CORONAVIRUS 2 BY RT PCR: SARS Coronavirus 2 by RT PCR: NEGATIVE

## 2021-12-02 LAB — HEMOGLOBIN A1C
Hgb A1c MFr Bld: 8 % — ABNORMAL HIGH (ref 4.8–5.6)
Mean Plasma Glucose: 182.9 mg/dL

## 2021-12-02 LAB — URINE CULTURE

## 2021-12-02 LAB — CBC
HCT: 45.6 % (ref 39.0–52.0)
Hemoglobin: 15.5 g/dL (ref 13.0–17.0)
MCH: 28.8 pg (ref 26.0–34.0)
MCHC: 34 g/dL (ref 30.0–36.0)
MCV: 84.8 fL (ref 80.0–100.0)
Platelets: 219 10*3/uL (ref 150–400)
RBC: 5.38 MIL/uL (ref 4.22–5.81)
RDW: 13.1 % (ref 11.5–15.5)
WBC: 4.5 10*3/uL (ref 4.0–10.5)
nRBC: 0 % (ref 0.0–0.2)

## 2021-12-02 LAB — BETA-HYDROXYBUTYRIC ACID
Beta-Hydroxybutyric Acid: >8 mmol/L — ABNORMAL HIGH (ref 0.05–0.27)
Beta-Hydroxybutyric Acid: 4.09 mmol/L — ABNORMAL HIGH (ref 0.05–0.27)

## 2021-12-02 LAB — TSH: TSH: 1.446 u[IU]/mL (ref 0.350–4.500)

## 2021-12-02 LAB — LACTIC ACID, PLASMA: Lactic Acid, Venous: 1.6 mmol/L (ref 0.5–1.9)

## 2021-12-02 LAB — CK: Total CK: 1033 U/L — ABNORMAL HIGH (ref 49–397)

## 2021-12-02 NOTE — ED Notes (Signed)
RN unable to obtain labs.  Phlebotomy notified.

## 2021-12-02 NOTE — Progress Notes (Signed)
PHARMACY - PHYSICIAN COMMUNICATION CRITICAL VALUE ALERT - BLOOD CULTURE IDENTIFICATION (BCID)  [name]. is an 56 y.o. male who presented to [facility] on 12/01/2021 with a chief complaint of AMS  Assessment:   55yom found down in home Tm 100 wbc wnl 1/2 Bcx with staph no species  - not aureus - per Micro Discussed with MD could be contaminant  - no ABX changes  Name of physician (or Provider) Contacted: Dr Agustin Cree   Current antibiotics: None    Bonnita Nasuti Pharm.D. CPP, BCPS Clinical Pharmacist [phone] 12/02/2021 6:10 PM   Results for orders placed or performed during the hospital encounter of 12/01/21  Blood Culture ID Panel (Reflexed) (Collected: 12/01/2021  5:40 PM)  Result Value Ref Range   Enterococcus faecalis NOT DETECTED NOT DETECTED   Enterococcus Faecium NOT DETECTED NOT DETECTED   Listeria monocytogenes NOT DETECTED NOT DETECTED   Staphylococcus species DETECTED (A) NOT DETECTED   Staphylococcus aureus (BCID) NOT DETECTED NOT DETECTED   Staphylococcus epidermidis NOT DETECTED NOT DETECTED   Staphylococcus lugdunensis NOT DETECTED NOT DETECTED   Streptococcus species NOT DETECTED NOT DETECTED   Streptococcus agalactiae NOT DETECTED NOT DETECTED   Streptococcus pneumoniae NOT DETECTED NOT DETECTED   Streptococcus pyogenes NOT DETECTED NOT DETECTED   A.calcoaceticus-baumannii NOT DETECTED NOT DETECTED   Bacteroides fragilis NOT DETECTED NOT DETECTED   Enterobacterales NOT DETECTED NOT DETECTED   Enterobacter cloacae complex NOT DETECTED NOT DETECTED   Escherichia coli NOT DETECTED NOT DETECTED   Klebsiella aerogenes NOT DETECTED NOT DETECTED   Klebsiella oxytoca NOT DETECTED NOT DETECTED   Klebsiella pneumoniae NOT DETECTED NOT DETECTED   Proteus species NOT DETECTED NOT DETECTED   Salmonella species NOT DETECTED NOT DETECTED   Serratia marcescens NOT DETECTED NOT DETECTED   Haemophilus influenzae NOT DETECTED NOT DETECTED   Neisseria  meningitidis NOT DETECTED NOT DETECTED   Pseudomonas aeruginosa NOT DETECTED NOT DETECTED   Stenotrophomonas maltophilia NOT DETECTED NOT DETECTED   Candida albicans NOT DETECTED NOT DETECTED   Candida auris NOT DETECTED NOT DETECTED   Candida glabrata NOT DETECTED NOT DETECTED   Candida krusei NOT DETECTED NOT DETECTED   Candida parapsilosis NOT DETECTED NOT DETECTED   Candida tropicalis NOT DETECTED NOT DETECTED   Cryptococcus neoformans/gattii NOT DETECTED NOT DETECTED

## 2021-12-02 NOTE — Progress Notes (Signed)
LTM EEG hooked up and running - no initial skin breakdown - push button tested - neuro notified. [facility] monitoring.  

## 2021-12-02 NOTE — Progress Notes (Signed)
Patient Mews still Red. Patient HR up to 150's. Patient pupils dilated. Patient only following commands intermittently, and not following any right now. S/w Lyndel Safe MD, to alert and for further insight on patient plan.

## 2021-12-02 NOTE — Consult Note (Cosign Needed Addendum)
Psychiatric Consult Note  Patient seen on rounds today, was unable to communicate. Patient was unaccompanied, so unable to get collateral.   Patient shaking with horizontal nystagmus. Patient lifted his head in the direction of provider, but was unresponsive to all questions and unable to provide any history. Nurse confirmed that patient has been unable to communicate since 0700.  Will plan to request pharmacist to check PTA meds given significant polypharmacy including Klonopin and Oxycodone being obtained from multiple providers. Patient was receiving pain medications through [facility], and is not currently receiving any opioids in the ED. Concern that current presentation is secondary to opioid withdrawal and that CIWA protocol necessary. Patient is receiving current prescriptions but UDS has been negative for everything for the past 5 years.  Plan to hold all psychotropic medications until more history is obtained. Medications listed include welllbutrin, abilify, buspirone, clonazepam, depakote, gabapentin, oxycodone, seroquel, tizanidine, trazadone. UDS was negative on this admission, hx of negative UDS.  Psychiatry will sign off. Please re-consult if needed.   Annia Belt, [facility]

## 2021-12-02 NOTE — Progress Notes (Addendum)
SUBJECTIVE  Chief complaint Altered mental status  Summary Aleph Nickson. is a 56 y.o. male with PMHx of PTSD, bipolar disorder, schizoaffective disorder, questionable seizures, medication-induced tremors, substance abuse, polypharmacy, T2DM, and HTN who presented to the ED on 12/01/2021 with altered mental status. He was last known to be at his baseline on 11/29/2021.   Overnight events NAEON. Pt remained tachypneic, tachycardic, and hypertensive. Noted to have low-grade rectal temperature (100.1 F). Per nursing staff, pt did have UOP and BM this AM.   Today's interview Pt does not engage with interview or exam. Family was not present during the interview.   Review of systems ROS is unattainable due to pt's altered mental status.   OBJECTIVE  Vitals BP (!) 136/107   Pulse (!) 131   Temp 100 F (37.8 C) (Axillary)   Resp (!) 21   Ht '6\' 3"'$  (1.905 m)   Wt 91.9 kg   SpO2 95%   BMI 25.32 kg/m   Physical exam Constitutional: Awake but not engaging with exam, does not follow commands. Appears disheveled and malnourished.  Eyes: PERRL. Pupils dilated to 5 mm bilaterally. Sclera anicteric.  HENT: Normocephalic and atraumatic.   Cardiovascular: Tachycardic. Regular rhythm. No M/R/G.  Respiratory: CTAB. No crackles or wheezes.  Gastrointestinal: Soft, NT/ND.  Musculoskeletal: Extremeties are warm. No clubbing, cyanosis, or edema. +2 pulses in BUE and BLE. Bilateral great toes with erythema.  Skin: BLE with multiple excoriations. Scattered excoriations and nonblanching petechiae of BUE, no petechiae noted on chest or back.  Neurologic/ psychiatric: A&O x0. Moves all four extremeties spontaneously but does not follow commands. Generally tremulous but BUE tremors worsened with passive lifting. Resists extension of BUE with some rigidity. See neurology note for more detailed exam including CN and reflex testing.   Labs, studies, and imaging Labs, studies, and imaging from the  last 24 hours per EMR and personally reviewed.  Results for orders placed or performed during the hospital encounter of 12/01/21 (from the past 24 hour(s))  CBG monitoring, ED     Status: Abnormal   Collection Time: 12/01/21  5:02 PM  Result Value Ref Range   Glucose-Capillary 328 (H) 70 - 99 mg/dL  Ammonia     Status: Abnormal   Collection Time: 12/01/21  5:03 PM  Result Value Ref Range   Ammonia 44 (H) 9 - 35 umol/L  Valproic acid level     Status: Abnormal   Collection Time: 12/01/21  5:08 PM  Result Value Ref Range   Valproic Acid Lvl <10 (L) 50.0 - 100.0 ug/mL  CK     Status: Abnormal   Collection Time: 12/01/21  5:08 PM  Result Value Ref Range   Total CK 1,261 (H) 49 - 397 U/L  Comprehensive metabolic panel     Status: Abnormal   Collection Time: 12/01/21  5:40 PM  Result Value Ref Range   Sodium 141 135 - 145 mmol/L   Potassium 5.3 (H) 3.5 - 5.1 mmol/L   Chloride 101 98 - 111 mmol/L   CO2 13 (L) 22 - 32 mmol/L   Glucose, Bld 318 (H) 70 - 99 mg/dL   BUN 66 (H) 6 - 20 mg/dL   Creatinine, Ser 3.24 (H) 0.61 - 1.24 mg/dL   Calcium 10.6 (H) 8.9 - 10.3 mg/dL   Total Protein 8.3 (H) 6.5 - 8.1 g/dL   Albumin 4.4 3.5 - 5.0 g/dL   AST 67 (H) 15 - 41 U/L   ALT 44 0 -  44 U/L   Alkaline Phosphatase 59 38 - 126 U/L   Total Bilirubin 2.0 (H) 0.3 - 1.2 mg/dL   GFR, Estimated 22 (L) >60 mL/min   Anion gap 27 (H) 5 - 15  CBC     Status: Abnormal   Collection Time: 12/01/21  5:40 PM  Result Value Ref Range   WBC 10.6 (H) 4.0 - 10.5 K/uL   RBC 5.33 4.22 - 5.81 MIL/uL   Hemoglobin 15.3 13.0 - 17.0 g/dL   HCT 45.8 39.0 - 52.0 %   MCV 85.9 80.0 - 100.0 fL   MCH 28.7 26.0 - 34.0 pg   MCHC 33.4 30.0 - 36.0 g/dL   RDW 13.0 11.5 - 15.5 %   Platelets 222 150 - 400 K/uL   nRBC 0.0 0.0 - 0.2 %  Lactic acid, plasma     Status: Abnormal   Collection Time: 12/01/21  5:40 PM  Result Value Ref Range   Lactic Acid, Venous 2.9 (HH) 0.5 - 1.9 mmol/L  Protime-INR     Status: None   Collection  Time: 12/01/21  5:40 PM  Result Value Ref Range   Prothrombin Time 14.8 11.4 - 15.2 seconds   INR 1.2 0.8 - 1.2  APTT     Status: None   Collection Time: 12/01/21  5:40 PM  Result Value Ref Range   aPTT 25 24 - 36 seconds  Blood Culture (routine x 2)     Status: None (Preliminary result)   Collection Time: 12/01/21  5:40 PM   Specimen: BLOOD RIGHT FOREARM  Result Value Ref Range   Specimen Description BLOOD RIGHT FOREARM    Special Requests      BOTTLES DRAWN AEROBIC AND ANAEROBIC Blood Culture adequate volume   Culture      NO GROWTH < 12 HOURS Performed at [facility], [address], [place], [place] [address]    Report Status PENDING   Resp Panel by RT-PCR (Flu A&B, Covid) Anterior Nasal Swab     Status: None   Collection Time: 12/01/21  5:45 PM   Specimen: Anterior Nasal Swab  Result Value Ref Range   SARS Coronavirus 2 by RT PCR NEGATIVE NEGATIVE   Influenza A by PCR NEGATIVE NEGATIVE   Influenza B by PCR NEGATIVE NEGATIVE  Blood Culture (routine x 2)     Status: None (Preliminary result)   Collection Time: 12/01/21  5:45 PM   Specimen: BLOOD LEFT FOREARM  Result Value Ref Range   Specimen Description BLOOD LEFT FOREARM    Special Requests      BOTTLES DRAWN AEROBIC AND ANAEROBIC Blood Culture results may not be optimal due to an excessive volume of blood received in culture bottles   Culture      NO GROWTH < 12 HOURS Performed at [facility], [place] [address], [place], [place] [address]    Report Status PENDING   Urinalysis, Routine w reflex microscopic Anterior Nasal Swab     Status: Abnormal   Collection Time: 12/01/21  5:45 PM  Result Value Ref Range   Color, Urine YELLOW YELLOW   APPearance CLEAR CLEAR   Specific Gravity, Urine 1.024 1.005 - 1.030   pH 5.0 5.0 - 8.0   Glucose, UA >=500 (A) NEGATIVE mg/dL   Hgb urine dipstick LARGE (A) NEGATIVE   Bilirubin Urine NEGATIVE NEGATIVE   Ketones, ur 80 (A) NEGATIVE mg/dL   Protein, ur 100 (A) NEGATIVE  mg/dL   Nitrite NEGATIVE NEGATIVE  Leukocytes,Ua NEGATIVE NEGATIVE   RBC / HPF 0-5 0 - 5 RBC/hpf   WBC, UA 0-5 0 - 5 WBC/hpf   Bacteria, UA RARE (A) NONE SEEN   Squamous Epithelial / LPF 0-5 0 - 5   Mucus PRESENT   CBG monitoring, ED     Status: Abnormal   Collection Time: 12/01/21  9:46 PM  Result Value Ref Range   Glucose-Capillary 302 (H) 70 - 99 mg/dL   Comment 1 Notify RN    Comment 2 Document in Chart   Lactic acid, plasma     Status: None   Collection Time: 12/01/21 10:00 PM  Result Value Ref Range   Lactic Acid, Venous 1.6 0.5 - 1.9 mmol/L  Basic metabolic panel     Status: Abnormal   Collection Time: 12/01/21 10:00 PM  Result Value Ref Range   Sodium 141 135 - 145 mmol/L   Potassium 4.7 3.5 - 5.1 mmol/L   Chloride 108 98 - 111 mmol/L   CO2 13 (L) 22 - 32 mmol/L   Glucose, Bld 241 (H) 70 - 99 mg/dL   BUN 52 (H) 6 - 20 mg/dL   Creatinine, Ser 2.24 (H) 0.61 - 1.24 mg/dL   Calcium 8.7 (L) 8.9 - 10.3 mg/dL   GFR, Estimated 34 (L) >60 mL/min   Anion gap 20 (H) 5 - 15  Beta-hydroxybutyric acid     Status: Abnormal   Collection Time: 12/01/21 10:00 PM  Result Value Ref Range   Beta-Hydroxybutyric Acid >8.00 (H) 0.05 - 0.27 mmol/L  CBG monitoring, ED     Status: Abnormal   Collection Time: 12/02/21 12:29 AM  Result Value Ref Range   Glucose-Capillary 300 (H) 70 - 99 mg/dL   Comment 1 Notify RN    Comment 2 Document in Chart   Rapid urine drug screen (hospital performed)     Status: None   Collection Time: 12/02/21 12:51 AM  Result Value Ref Range   Opiates NONE DETECTED NONE DETECTED   Cocaine NONE DETECTED NONE DETECTED   Benzodiazepines NONE DETECTED NONE DETECTED   Amphetamines NONE DETECTED NONE DETECTED   Tetrahydrocannabinol NONE DETECTED NONE DETECTED   Barbiturates NONE DETECTED NONE DETECTED  CBG monitoring, ED     Status: Abnormal   Collection Time: 12/02/21 12:58 AM  Result Value Ref Range   Glucose-Capillary 338 (H) 70 - 99 mg/dL  CBG monitoring, ED      Status: Abnormal   Collection Time: 12/02/21  2:13 AM  Result Value Ref Range   Glucose-Capillary 224 (H) 70 - 99 mg/dL  Lactic acid, plasma     Status: None   Collection Time: 12/02/21  2:22 AM  Result Value Ref Range   Lactic Acid, Venous 1.6 0.5 - 1.9 mmol/L  CBG monitoring, ED     Status: Abnormal   Collection Time: 12/02/21  3:06 AM  Result Value Ref Range   Glucose-Capillary 221 (H) 70 - 99 mg/dL   Comment 1 Notify RN    Comment 2 Document in Chart   CBG monitoring, ED     Status: Abnormal   Collection Time: 12/02/21  4:10 AM  Result Value Ref Range   Glucose-Capillary >600 (HH) 70 - 99 mg/dL  CBG monitoring, ED     Status: Abnormal   Collection Time: 12/02/21  4:38 AM  Result Value Ref Range   Glucose-Capillary 180 (H) 70 - 99 mg/dL   Comment 1 Notify RN    Comment 2 Document in Chart  TSH     Status: None   Collection Time: 12/02/21  4:39 AM  Result Value Ref Range   TSH 1.446 0.350 - 4.500 uIU/mL  CBC     Status: None   Collection Time: 12/02/21  4:39 AM  Result Value Ref Range   WBC 4.5 4.0 - 10.5 K/uL   RBC 5.38 4.22 - 5.81 MIL/uL   Hemoglobin 15.5 13.0 - 17.0 g/dL   HCT 45.6 39.0 - 52.0 %   MCV 84.8 80.0 - 100.0 fL   MCH 28.8 26.0 - 34.0 pg   MCHC 34.0 30.0 - 36.0 g/dL   RDW 13.1 11.5 - 15.5 %   Platelets 219 150 - 400 K/uL   nRBC 0.0 0.0 - 0.2 %  CK     Status: Abnormal   Collection Time: 12/02/21  4:39 AM  Result Value Ref Range   Total CK 1,033 (H) 49 - 397 U/L  Comprehensive metabolic panel     Status: Abnormal   Collection Time: 12/02/21  4:39 AM  Result Value Ref Range   Sodium 146 (H) 135 - 145 mmol/L   Potassium 4.4 3.5 - 5.1 mmol/L   Chloride 110 98 - 111 mmol/L   CO2 20 (L) 22 - 32 mmol/L   Glucose, Bld 185 (H) 70 - 99 mg/dL   BUN 63 (H) 6 - 20 mg/dL   Creatinine, Ser 2.70 (H) 0.61 - 1.24 mg/dL   Calcium 10.1 8.9 - 10.3 mg/dL   Total Protein 7.2 6.5 - 8.1 g/dL   Albumin 3.8 3.5 - 5.0 g/dL   AST 55 (H) 15 - 41 U/L   ALT 38 0 - 44 U/L    Alkaline Phosphatase 47 38 - 126 U/L   Total Bilirubin 1.1 0.3 - 1.2 mg/dL   GFR, Estimated 27 (L) >60 mL/min   Anion gap 16 (H) 5 - 15  Beta-hydroxybutyric acid     Status: Abnormal   Collection Time: 12/02/21  4:39 AM  Result Value Ref Range   Beta-Hydroxybutyric Acid 4.09 (H) 0.05 - 0.27 mmol/L  CBG monitoring, ED     Status: Abnormal   Collection Time: 12/02/21  6:32 AM  Result Value Ref Range   Glucose-Capillary 551 (HH) 70 - 99 mg/dL   Comment 1 Notify RN   CBG monitoring, ED     Status: Abnormal   Collection Time: 12/02/21  6:33 AM  Result Value Ref Range   Glucose-Capillary 223 (H) 70 - 99 mg/dL  CBG monitoring, ED     Status: Abnormal   Collection Time: 12/02/21  6:41 AM  Result Value Ref Range   Glucose-Capillary 185 (H) 70 - 99 mg/dL  CBG monitoring, ED     Status: Abnormal   Collection Time: 12/02/21  7:45 AM  Result Value Ref Range   Glucose-Capillary 194 (H) 70 - 99 mg/dL  Basic metabolic panel     Status: Abnormal   Collection Time: 12/02/21  8:04 AM  Result Value Ref Range   Sodium 147 (H) 135 - 145 mmol/L   Potassium 3.8 3.5 - 5.1 mmol/L   Chloride 119 (H) 98 - 111 mmol/L   CO2 16 (L) 22 - 32 mmol/L   Glucose, Bld 165 (H) 70 - 99 mg/dL   BUN 51 (H) 6 - 20 mg/dL   Creatinine, Ser 1.97 (H) 0.61 - 1.24 mg/dL   Calcium 7.8 (L) 8.9 - 10.3 mg/dL   GFR, Estimated 40 (L) >60 mL/min   Anion gap 12  5 - 15  Hemoglobin A1c     Status: Abnormal   Collection Time: 12/02/21  8:04 AM  Result Value Ref Range   Hgb A1c MFr Bld 8.0 (H) 4.8 - 5.6 %   Mean Plasma Glucose 182.9 mg/dL  CBG monitoring, ED     Status: Abnormal   Collection Time: 12/02/21  8:45 AM  Result Value Ref Range   Glucose-Capillary 200 (H) 70 - 99 mg/dL  CBG monitoring, ED     Status: Abnormal   Collection Time: 12/02/21 10:09 AM  Result Value Ref Range   Glucose-Capillary 272 (H) 70 - 99 mg/dL  CBG monitoring, ED     Status: Abnormal   Collection Time: 12/02/21 11:17 AM  Result Value Ref Range    Glucose-Capillary 259 (H) 70 - 99 mg/dL  CBG monitoring, ED     Status: None   Collection Time: 12/02/21 12:21 PM  Result Value Ref Range   Glucose-Capillary 71 70 - 99 mg/dL  CBG monitoring, ED     Status: Abnormal   Collection Time: 12/02/21  1:20 PM  Result Value Ref Range   Glucose-Capillary 154 (H) 70 - 99 mg/dL   MR brain wo contrast (12/01/2021): 1. Technically limited exam due to the patient's inability to tolerate the exam. Diffusion-weighted sequences only were performed. 2. No acute intracranial infarct. No other obvious intracranial abnormality on this limited exam.  EEG (12/02/2021): In progress  ASSESSMENT & PLAN  Silus Lanzo. is a 56 y.o. male with PMHx of PTSD, bipolar disorder, schizoaffective disorder, questionable seizures, medication-induced tremors, substance abuse, dementia vs pseudodementia in the setting of his psychiatric history and polypharmacy, T2DM, HTN, and peripheral neuropathy who is admitted for workup and management of acute encephalopathy.  Hospital day: 1  Principal Problem:   Encephalopathy acute  Acute encephalopathy, likely mixed toxic-metabolic Differential for pt's encephalopathy remains fairly broad, particularly in the setting of his diabetic ketoacidosis; potential contributors include polypharmacy, drug or substance overdose/ toxicity, seizure (although his history of seizure is unclear), rhabdomyolysis, benzodiazepine withdrawal (although he has been getting scheduled ativan without response), serotonin syndrome (although neurology evaluated him and has low concern at this time), and NMS (he has low-grade fevers, rigidity on exam, and persistent hypertension and tachycardia; he is notably prescribed seroquel). Head CT and brain MRI were unremarkable. UDS was negative except for tramadol. Sepsis workup was negative and lactate has normalized. There is low suspicion for infectious etiology so antibiotics have been discontinued but we will  continue to follow cultures. EEG read showed periodic discharges with triphasic morphology which could be associated with ictal-interictal activity, although toxic-metabolic causes are more likely. There was also evidence of diffuse encephalopathy. No seizures seen. The EEG duration was 22.15 minutes so it was recommended to obtain a longer study. We will continue with workup and supportive management while monitoring his mental status as his DKA and AKI improve.  - Neurology consulted, appreciate recs - Holding home psychotropic medications at this time - Per neuro, restart seroquel 50 mg nightly when pt is able to tolerate PO - F/u overnight EEG - IV ativan 1 mg q6hr (ends tonight); consider additional ativan after tomorrow's reevaluation - Repeat CK - Trend CBC - Follow BCx and UCx - Keep NPO; SLP eval when able - PT/OT/TOC eval when able - Evaluate for orthostatics when able   Anion gap metabolic acidosis  H/o K1SW AG metabolic acidosis was suspected to be DKA given his ketosis and elevated blood glucoses. Calculated  delta-delta was -1 so there may be an element of concomitant normal gap metabolic acidosis. Pt did present with rhabdo as noted below which can be contributing. Anion gap has closed this AM and lactate has normalized. Beta-hydroxybutyrate has improved from >8 to 4.09. Pt is currently unable to take anything by PO due to his encephalopathy, so we will continue with his IV regimen until he resumes PO intake.  - Continue insulin drip w/ endotool - Continue mIVF (D5LR @ 125 mL/hr)  - Trend BMP - Reevaluate home insulin regimen when able (previously on lantus 45-50u daily and humalog 10-14u TID w/ meals)  Rhabdomyolysis AKI on CKD stage 4 CK has improved from 1261 to 1033, lactate has normalized, and Cr has improved from 2.70 to 1.97. Baseline Cr previously noted to be around 2. Unable to rule out seizure as cause of rhabdo, but pt also has a history of recurrent falls and may have  been on the ground for up to 2 days. Will continue to monitor.  - mIVF - Trend BMP  Psychiatric comorbidities  Polypharmacy Pt has documented history of PTSD, bipolar disorder, and schizoaffective disorder as well as questionable seizures. He is on multiple centrally-active medications including benzodiazepines. He was previously seen by neurology as outpatient and was thought to have medication-induced tremors. We are currently holding his home meds in the setting of encephalopathy and will consider restarting them as we workup his acute illness.  - Psych consulted O/N, signed off for now - Continue holding home psychotropic medications  - Scheduled ativan 1 mg q6hr  Checklist Diet: NPO IVF: mIVF (D5LR @ 125 mL/hr) DVT PPx: Lovenox Electrolytes: Replete as inducated Code status: DNR Dispo: Admit to progressive   ------------------------  Lunette Stands, MS4 12/02/2021 Pager: [phone]

## 2021-12-02 NOTE — Progress Notes (Signed)
EEG complete - results pending 

## 2021-12-02 NOTE — Procedures (Signed)
Patient Name: [name].  MRN: [id number]  Epilepsy Attending: Lora Havens  Referring Physician/Provider: Linus Galas, MD  Date: 12/02/2021 Duration: 22.15 mins  Patient history: A 56 year old male with altered mental status.  EEG to evaluate for seizure.  Level of alertness:  lethargic   AEDs during EEG study: Ativan  Technical aspects: This EEG study was done with scalp electrodes positioned according to the 10-20 International system of electrode placement. Electrical activity was reviewed with band pass filter of 1-'70Hz'$ , sensitivity of 7 uV/mm, display speed of 58m/sec with a '60Hz'$  notched filter applied as appropriate. EEG data were recorded continuously and digitally stored.  Video monitoring was available and reviewed as appropriate.  Description: No clear posterior dominant rhythm was seen. EEG showed continuous generalized 3 to 5 Hz theta-delta slowing. Generalized periodic discharges with triphasic morphology at 1 Hz were also noted. Hyperventilation and photic stimulation were not performed.     ABNORMALITY - Periodic discharges with triphasic morphology, generalized ( GPDs) - Continuous slow, generalized  IMPRESSION: This study generalized periodic discharges with triphasic morphology which can be on the ictal-interictal continuum.  However there morphology and frequency is more likely indicative of toxic-metabolic causes.  Additionally there is evidence of moderate diffuse encephalopathy.  No seizures were seen throughout the recording.   If suspicion for ictal-interictal activity remains a concern, a prolonged study can be considered.   Pasqualina Colasurdo [name]

## 2021-12-02 NOTE — ED Notes (Signed)
EEG in progress 

## 2021-12-02 NOTE — ED Notes (Signed)
Admitting made aware of temp and rectal tylenol given.

## 2021-12-02 NOTE — ED Notes (Signed)
Assumed care of patient. Pt does not respond to verbal commands, stares off, localizes pain.  Pt CBG rechecked upon assumption of care.  Pt tachycardic in the 130s as has been throughout his time in the dept.  Pt temp rechecked 99 axillary.  Bed rails up, call light in reach.

## 2021-12-02 NOTE — ED Notes (Signed)
Blood sugar 71, insulin paused.   Pt has episodes of HR spiking to 170 then back to the 130s.  Family at bedside.  Admitting provider notified.

## 2021-12-02 NOTE — Progress Notes (Signed)
Dr. Johnney Ou, Lunette Stands (MS4), and Dimitry Shitarev (MS3) spoke with the patient's sister [name]) in an ED conference room.   [name] fills patient's weekly pill organizer every Sunday and she usually sees him on Sundays too. She puts in all of his medications and has all of the pill bottles at her house, except for his klonopin (which is PRN) and percocet (which she does not want him taking daily) - he has the bottles of klonopin and percocet at his own house. Patient has a home health aide which comes in for a few hours every afternoon on the weekdays and is supposed to come on Saturdays, but [name] believes patient may have rearranged the aide's schedule such that she no longer comes on the weekends. [name] does not have the aide's contact information. Patient's psych provider is Leland Her.   [name] last talked to patient over the phone on Friday (8/11); he did not pick up her phone or open the door for his neighbor on Saturday (8/12). Patient has had many falls and episodes of confusion in the past, up to 1-2 days at a time. He seems to have more frequent confusion when he is inconsistent with his meds. However, his current symptoms are different because this is the first time [name] has seen him not respond or engage with others. When [name] looked at his pill organizer yesterday, his Friday meds were still there but his Saturday and Sunday meds were missing.   --  Lunette Stands, MS4  Attestation for Student Documentation: I personally was present during this conversation  Riesa Pope, MD 12/02/2021, 4:27 PM

## 2021-12-02 NOTE — ED Notes (Signed)
Pt had pulled his condom cath off and the bed was wet.  Pt felt very hot to the touch, rectal temp of 100.1.  Pt also had a bowel movement.  Pt was cleaned up and all linens were changed.

## 2021-12-02 NOTE — ED Notes (Signed)
Pt wet and soiled  Pt cleaned and bed linens changed.  Pt felt warm to touch, rectal temp checked, 100.8, given rectal tylenol as prn ordered

## 2021-12-03 ENCOUNTER — Inpatient Hospital Stay (HOSPITAL_COMMUNITY): Payer: Medicare Other

## 2021-12-03 DIAGNOSIS — R4182 Altered mental status, unspecified: Secondary | ICD-10-CM | POA: Diagnosis not present

## 2021-12-03 DIAGNOSIS — G9340 Encephalopathy, unspecified: Secondary | ICD-10-CM | POA: Diagnosis not present

## 2021-12-03 LAB — BASIC METABOLIC PANEL
Anion gap: 8 (ref 5–15)
BUN: 50 mg/dL — ABNORMAL HIGH (ref 6–20)
CO2: 26 mmol/L (ref 22–32)
Calcium: 10 mg/dL (ref 8.9–10.3)
Chloride: 118 mmol/L — ABNORMAL HIGH (ref 98–111)
Creatinine, Ser: 2.24 mg/dL — ABNORMAL HIGH (ref 0.61–1.24)
GFR, Estimated: 34 mL/min — ABNORMAL LOW (ref >60)
Glucose, Bld: 164 mg/dL — ABNORMAL HIGH (ref 70–99)
Potassium: 3.4 mmol/L — ABNORMAL LOW (ref 3.5–5.1)
Sodium: 152 mmol/L — ABNORMAL HIGH (ref 135–145)

## 2021-12-03 LAB — CBC
HCT: 39.4 % (ref 39.0–52.0)
Hemoglobin: 13.9 g/dL (ref 13.0–17.0)
MCH: 29.3 pg (ref 26.0–34.0)
MCHC: 35.3 g/dL (ref 30.0–36.0)
MCV: 82.9 fL (ref 80.0–100.0)
Platelets: 152 10*3/uL (ref 150–400)
RBC: 4.75 MIL/uL (ref 4.22–5.81)
RDW: 13 % (ref 11.5–15.5)
WBC: 4.6 10*3/uL (ref 4.0–10.5)
nRBC: 0 % (ref 0.0–0.2)

## 2021-12-03 LAB — GLUCOSE, CAPILLARY
Glucose-Capillary: 127 mg/dL — ABNORMAL HIGH (ref 70–99)
Glucose-Capillary: 144 mg/dL — ABNORMAL HIGH (ref 70–99)
Glucose-Capillary: 148 mg/dL — ABNORMAL HIGH (ref 70–99)
Glucose-Capillary: 152 mg/dL — ABNORMAL HIGH (ref 70–99)
Glucose-Capillary: 156 mg/dL — ABNORMAL HIGH (ref 70–99)
Glucose-Capillary: 156 mg/dL — ABNORMAL HIGH (ref 70–99)
Glucose-Capillary: 161 mg/dL — ABNORMAL HIGH (ref 70–99)
Glucose-Capillary: 172 mg/dL — ABNORMAL HIGH (ref 70–99)
Glucose-Capillary: 201 mg/dL — ABNORMAL HIGH (ref 70–99)
Glucose-Capillary: 215 mg/dL — ABNORMAL HIGH (ref 70–99)
Glucose-Capillary: 216 mg/dL — ABNORMAL HIGH (ref 70–99)
Glucose-Capillary: 227 mg/dL — ABNORMAL HIGH (ref 70–99)
Glucose-Capillary: 254 mg/dL — ABNORMAL HIGH (ref 70–99)
Glucose-Capillary: 318 mg/dL — ABNORMAL HIGH (ref 70–99)

## 2021-12-03 LAB — BETA-HYDROXYBUTYRIC ACID: Beta-Hydroxybutyric Acid: 0.32 mmol/L — ABNORMAL HIGH (ref 0.05–0.27)

## 2021-12-03 LAB — CK: Total CK: 373 U/L (ref 49–397)

## 2021-12-03 MED ORDER — LORAZEPAM 2 MG/ML IJ SOLN
1.0000 mg | INTRAMUSCULAR | Status: DC | PRN
  Administered 2021-12-03: 2 mg via INTRAVENOUS
  Administered 2021-12-03: 1 mg via INTRAVENOUS
  Filled 2021-12-03 (×2): qty 1

## 2021-12-03 MED ORDER — ENOXAPARIN SODIUM 40 MG/0.4ML IJ SOSY
40.0000 mg | PREFILLED_SYRINGE | INTRAMUSCULAR | Status: DC
  Administered 2021-12-04 – 2021-12-19 (×16): 40 mg via SUBCUTANEOUS
  Filled 2021-12-03 (×16): qty 0.4

## 2021-12-03 MED ORDER — INSULIN GLARGINE-YFGN 100 UNIT/ML ~~LOC~~ SOLN
15.0000 [IU] | Freq: Every day | SUBCUTANEOUS | Status: DC
  Administered 2021-12-03: 15 [IU] via SUBCUTANEOUS
  Filled 2021-12-03 (×2): qty 0.15

## 2021-12-03 MED ORDER — LORAZEPAM 2 MG/ML IJ SOLN
0.5000 mg | Freq: Four times a day (QID) | INTRAMUSCULAR | Status: DC
  Administered 2021-12-03 – 2021-12-04 (×4): 0.5 mg via INTRAVENOUS
  Filled 2021-12-03 (×4): qty 1

## 2021-12-03 MED ORDER — INSULIN GLARGINE-YFGN 100 UNIT/ML ~~LOC~~ SOLN: 20.0000 [IU] | Freq: Every day | SUBCUTANEOUS | Status: DC

## 2021-12-03 MED ORDER — POTASSIUM CHLORIDE IN NACL 20-0.45 MEQ/L-% IV SOLN
INTRAVENOUS | Status: AC
  Filled 2021-12-03: qty 1000

## 2021-12-03 MED ORDER — LORAZEPAM 1 MG PO TABS: 1.0000 mg | ORAL_TABLET | ORAL | Status: DC | PRN

## 2021-12-03 MED ORDER — INSULIN ASPART 100 UNIT/ML IJ SOLN
0.0000 [IU] | INTRAMUSCULAR | Status: DC
  Administered 2021-12-03: 3 [IU] via SUBCUTANEOUS
  Administered 2021-12-03: 2 [IU] via SUBCUTANEOUS
  Administered 2021-12-03: 3 [IU] via SUBCUTANEOUS
  Administered 2021-12-03: 7 [IU] via SUBCUTANEOUS
  Administered 2021-12-04 (×3): 3 [IU] via SUBCUTANEOUS
  Administered 2021-12-04: 5 [IU] via SUBCUTANEOUS
  Administered 2021-12-04 (×2): 3 [IU] via SUBCUTANEOUS
  Administered 2021-12-05: 5 [IU] via SUBCUTANEOUS
  Administered 2021-12-05: 3 [IU] via SUBCUTANEOUS
  Administered 2021-12-05: 5 [IU] via SUBCUTANEOUS
  Administered 2021-12-05: 3 [IU] via SUBCUTANEOUS
  Administered 2021-12-05: 2 [IU] via SUBCUTANEOUS
  Administered 2021-12-05: 3 [IU] via SUBCUTANEOUS
  Administered 2021-12-06 (×2): 2 [IU] via SUBCUTANEOUS
  Administered 2021-12-06: 3 [IU] via SUBCUTANEOUS
  Administered 2021-12-06: 1 [IU] via SUBCUTANEOUS
  Administered 2021-12-06: 3 [IU] via SUBCUTANEOUS
  Administered 2021-12-06 – 2021-12-07 (×4): 2 [IU] via SUBCUTANEOUS
  Administered 2021-12-07: 1 [IU] via SUBCUTANEOUS
  Administered 2021-12-07: 2 [IU] via SUBCUTANEOUS
  Administered 2021-12-08: 3 [IU] via SUBCUTANEOUS
  Administered 2021-12-08: 5 [IU] via SUBCUTANEOUS
  Administered 2021-12-08: 3 [IU] via SUBCUTANEOUS
  Administered 2021-12-08 (×2): 5 [IU] via SUBCUTANEOUS
  Administered 2021-12-08: 3 [IU] via SUBCUTANEOUS
  Administered 2021-12-09: 2 [IU] via SUBCUTANEOUS
  Administered 2021-12-09: 3 [IU] via SUBCUTANEOUS
  Administered 2021-12-09 (×2): 2 [IU] via SUBCUTANEOUS
  Administered 2021-12-09: 5 [IU] via SUBCUTANEOUS
  Administered 2021-12-09 (×2): 3 [IU] via SUBCUTANEOUS
  Administered 2021-12-10: 5 [IU] via SUBCUTANEOUS
  Administered 2021-12-10: 2 [IU] via SUBCUTANEOUS
  Administered 2021-12-10: 3 [IU] via SUBCUTANEOUS
  Administered 2021-12-10 (×2): 1 [IU] via SUBCUTANEOUS
  Administered 2021-12-11: 3 [IU] via SUBCUTANEOUS
  Administered 2021-12-11: 2 [IU] via SUBCUTANEOUS
  Administered 2021-12-11: 1 [IU] via SUBCUTANEOUS
  Administered 2021-12-11: 2 [IU] via SUBCUTANEOUS
  Administered 2021-12-11: 1 [IU] via SUBCUTANEOUS
  Administered 2021-12-12: 5 [IU] via SUBCUTANEOUS
  Administered 2021-12-12: 2 [IU] via SUBCUTANEOUS
  Administered 2021-12-12 (×3): 3 [IU] via SUBCUTANEOUS
  Administered 2021-12-13 (×2): 1 [IU] via SUBCUTANEOUS
  Administered 2021-12-13 (×2): 3 [IU] via SUBCUTANEOUS
  Administered 2021-12-14: 2 [IU] via SUBCUTANEOUS
  Administered 2021-12-14 – 2021-12-15 (×3): 1 [IU] via SUBCUTANEOUS
  Administered 2021-12-15 (×3): 2 [IU] via SUBCUTANEOUS
  Administered 2021-12-16 – 2021-12-17 (×5): 1 [IU] via SUBCUTANEOUS
  Administered 2021-12-17: 3 [IU] via SUBCUTANEOUS
  Administered 2021-12-17: 1 [IU] via SUBCUTANEOUS
  Administered 2021-12-18 – 2021-12-19 (×3): 2 [IU] via SUBCUTANEOUS
  Administered 2021-12-19 – 2021-12-20 (×3): 1 [IU] via SUBCUTANEOUS

## 2021-12-03 MED ORDER — DEXTROSE 5 % IV SOLN: INTRAVENOUS | Status: DC

## 2021-12-03 NOTE — Procedures (Addendum)
Patient Name: [name].  MRN: [id number]  Epilepsy Attending: Lora Havens  Referring Physician/Provider: Riesa Pope, MD Duration: 12/02/2021 1853 to 12/03/2021 1853   Patient history: A 56 year old male with altered mental status.  EEG to evaluate for seizure.   Level of alertness: Awake, asleep   AEDs during EEG study: Ativan   Technical aspects: This EEG study was done with scalp electrodes positioned according to the 10-20 International system of electrode placement. Electrical activity was reviewed with band pass filter of 1-'70Hz'$ , sensitivity of 7 uV/mm, display speed of 41m/sec with a '60Hz'$  notched filter applied as appropriate. EEG data were recorded continuously and digitally stored.  Video monitoring was available and reviewed as appropriate.   Description: No clear posterior dominant rhythm was seen.  Sleep was characterized by sleep spindles (13 to 15 Hz), maximal fronto-central region.  EEG showed continuous generalized 3 to 5 Hz theta-delta slowing. Generalized periodic discharges with triphasic morphology at 1 Hz were also noted, predominantly when awake/stimulated. Hyperventilation and photic stimulation were not performed.     Event button was pressed on 12/03/2021 at 0940.  Patient was laying in bed with eyes closed and had left upper extremity tremor-like movement.  Concomitant EEG before, during and after the event did not show any EEG change to suggest seizure.   ABNORMALITY - Periodic discharges with triphasic morphology, generalized ( GPDs) - Continuous slow, generalized   IMPRESSION: This study is suggestive of moderate diffuse encephalopathy, most likely secondary to toxic-metabolic causes.  No seizures were seen throughout the recording.  One event was recorded on 11/21/2021 at 0940 as described above without concomitant EEG change.  This was not an epileptic event.   Tramaine Sauls [name]

## 2021-12-03 NOTE — Progress Notes (Signed)
Subjective: NAEO> Had an episode of left upper extremity tremor which has been documented on admission and didn't show any eeg change.   ROS: Unable to obtain due to poor mental status  Examination  Vital signs in last 24 hours: Temp:  [98.8 F (37.1 C)-99.6 F (37.6 C)] 98.8 F (37.1 C) (08/15 0821) Pulse Rate:  [115-127] 115 (08/15 0821) Resp:  [20-26] 20 (08/15 0821) BP: (139-155)/(96-105) 154/98 (08/15 0821) SpO2:  [96 %-98 %] 98 % (08/15 0821)  General: lying in bed, appears frail Extremities: multiple scratch marks on the legs  Neuro: awake, looks at examiner and tracks in room but didn't follow commands, unable to elicit vertical gaze movements, no facial asymmetry, increased tone in all extremities with left upper extremity tremor which is more prominent on stimulation, withdraws minimally with noxious stimulation, no BL ankle clonus  Basic Metabolic Panel: Recent Labs  Lab 12/01/21 1740 12/01/21 2200 12/02/21 0439 12/02/21 0804 12/03/21 0412  NA 141 141 146* 147* 152*  K 5.3* 4.7 4.4 3.8 3.4*  CL 101 108 110 119* 118*  CO2 13* 13* 20* 16* 26  GLUCOSE 318* 241* 185* 165* 164*  BUN 66* 52* 63* 51* 50*  CREATININE 3.24* 2.24* 2.70* 1.97* 2.24*  CALCIUM 10.6* 8.7* 10.1 7.8* 10.0   CBC: Recent Labs  Lab 12/01/21 1740 12/02/21 0439 12/03/21 0412  WBC 10.6* 4.5 4.6  HGB 15.3 15.5 13.9  HCT 45.8 45.6 39.4  MCV 85.9 84.8 82.9  PLT 222 219 152    Coagulation Studies: Recent Labs    12/01/21 1740  LABPROT 14.8  INR 1.2    Imaging MR Brain wo contrast 12/01/2021:  1. Technically limited exam due to the patient's inability to tolerate the exam. Diffusion-weighted sequences only were performed. 2. No acute intracranial infarct. No other obvious intracranial abnormality on this limited exam.   ASSESSMENT AND PLAN: 56 year old with a significant psychiatric past medical history presenting for evaluation of altered mental status after having been found down  with last known well 2 days ago. No witnessed seizures although he was found in a pool of his urine and feces.  Acute encephalopathy Rhabdomyolysis Hypernatremia - ddx include toxic-metabolic ( hyperammonemia, medication withdrawal), infectious ( although afebrile, no leucocytosis, urine culture pending) vs opioid and benzo withdrawal ( UDS negative for benzo and opioids) vs serotonin syndrome. Also on multiple sedating meds at home ( aripiprazole, buspirone, buproprion, gabapentin, opioids, Clonazepam, seroquel, trazodone)  - Staph in blood cx, speciation pending. Holding antibiotics per medicine team due to suspicion that this could be contaminant   Recommendations - EEG didn't show any definite interictal-ictal abnormality. Will dc tomorrow if no events overnight - Continue CIWA protocol as well as scheduled Lorazepam 0.'5mg'$  Q6h for suspected serotoniin syndrome and to avoid alcohol withdrawal.  - Continue to hold sedating meds - continue seizure precautions - Management of rest of comorbidities per primary team  I have spent a total of  40  minutes with the patient reviewing hospital notes,  test results, labs and examining the patient as well as establishing an assessment and plan.  > 50% of time was spent in direct patient care.    Zeb Comfort Epilepsy [facility] For questions after 5pm please refer to AMION to reach the Neurologist on call

## 2021-12-03 NOTE — Progress Notes (Signed)
Endotool continues. CBG 161 upon recheck for 0855.  Insulin gtt increased to 4.2 per Endotool recommendations.  Next CBG check due at 1055 am per Endotool.

## 2021-12-03 NOTE — Inpatient Diabetes Management (Signed)
Inpatient Diabetes Program Recommendations  AACE/ADA: New Consensus Statement on Inpatient Glycemic Control (2015)  Target Ranges:  Prepandial:   less than 140 mg/dL      Peak postprandial:   less than 180 mg/dL (1-2 hours)      Critically ill patients:  140 - 180 mg/dL   Lab Results  Component Value Date   GLUCAP 148 (H) 12/03/2021   HGBA1C 8.0 (H) 12/02/2021    Review of Glycemic Control  Diabetes history: DM2 Outpatient Diabetes medications:  Lantus 45-50 units QD Humalog 10-14 units TID Current orders for Inpatient glycemic control:  IV insulin  Inpatient Diabetes Program Recommendations:    When MD is ready to transition to SQ insulin, please consider:  -Semglee 26 units 2 hours prior to discontinuing drip -Novolog 0-15 units TID and 0-5 units QHS  Will continue to follow while inpatient.  Thank you, Reche Dixon, MSN, [place] Diabetes Coordinator Inpatient Diabetes Program [phone] (team pager from 8a-5p)

## 2021-12-03 NOTE — Progress Notes (Signed)
vLTM maintenance   All impedances below 10kohms.  No skin breakdown noted at all skin sites 

## 2021-12-03 NOTE — Progress Notes (Signed)
   12/03/21 0821  Assess: MEWS Score  Temp 98.8 F (37.1 C)  BP (!) 154/98  MAP (mmHg) 115  Pulse Rate (!) 115  ECG Heart Rate (!) 118  Resp 20  Level of Consciousness Alert  SpO2 98 %  O2 Device Room Air  Assess: MEWS Score  MEWS Temp 0  MEWS Systolic 0  MEWS Pulse 2  MEWS RR 0  MEWS LOC 0  MEWS Score 2  MEWS Score Color Yellow  Assess: if the MEWS score is Yellow or Red  Were vital signs taken at a resting state? Yes  Focused Assessment No change from prior assessment  Does the patient meet 2 or more of the SIRS criteria? Yes  Does the patient have a confirmed or suspected source of infection? No  MEWS guidelines implemented *See Row Information* No, previously yellow, continue vital signs every 4 hours  Assess: SIRS CRITERIA  SIRS Temperature  0  SIRS Pulse 1  SIRS Respirations  0  SIRS WBC 1  SIRS Score Sum  2

## 2021-12-03 NOTE — Progress Notes (Addendum)
SUBJECTIVE  Chief complaint Altered mental status  Summary [name]. is a 56 y.o. male with PMHx of PTSD, bipolar disorder, schizoaffective disorder, questionable seizures, medication-induced tremors, substance abuse, polypharmacy, T2DM, and HTN who presented to the ED on 12/01/2021 with altered mental status. He was last known to be at his baseline on 11/29/2021.   Overnight events The O/N team was paged early in the night about tachycardia to the 150's - 160's, hypertension, and tachypnea. He was placed on 2L O2 [place] by nursing staff due to concerns for dyspnea. Of note, pt was persistently tachycardic, hypertensive, and tachypneic throughout the day yesterday as well and had been running low-grade temperatures. Tele showed ongoing sinus tachycardia with PACs. CXR was ordered to r/o aspiration PNA and was overall unrevealing. His last ativan dose was at 5 PM yesterday. This morning, tachycardia has improved to the 110's - 120's but he remains tachypneic (RR in the 20's) and hypertensive (last BP 154/98). He remained on IV insulin with endotool but nursing staff was burdened with the constant insulin titration.   Today's interview Pt does not engage with interview or exam. Family was not present during the interview.   Review of systems ROS is unattainable due to pt's altered mental status.   OBJECTIVE  Vitals BP (!) 149/105 (BP Location: Left Arm)   Pulse (!) 120   Temp 99.3 F (37.4 C) (Axillary)   Resp (!) 21   Ht '6\' 3"'$  (1.905 m)   Wt 91.9 kg   SpO2 96%   BMI 25.32 kg/m   Physical exam Constitutional: Awake but not oriented. Appears disheveled and malnourished.  Eyes: PERRL. Pupils dilated to 5 mm bilaterally. Sclera anicteric.  HENT: Normocephalic and atraumatic.   Cardiovascular: Tachycardic. Regular rhythm. No M/R/G.  Respiratory: CTAB. No crackles or wheezes.  Gastrointestinal: Soft, NT/ND.  Musculoskeletal: Extremeties are warm. No clubbing, cyanosis, or  edema. +2 pulses in BUE and BLE. Bilateral great toes with erythema, improved from yesterday.  Skin: BLE with multiple excoriations. Scattered excoriations and nonblanching petechiae of BUE, no petechiae noted on chest or back.  Neurologic/ psychiatric: A&O x0. Moves all four extremeties spontaneously. Generally tremulous but BUE tremors worsened with passive lifting. Resists extension of BUE with some rigidity but improved from yesterday. Tracks eyes to voice. Moves L toes slightly when asked.   Labs, studies, and imaging Labs, studies, and imaging from the last 24 hours per EMR and personally reviewed.  Results for orders placed or performed during the hospital encounter of 12/01/21 (from the past 24 hour(s))  CBG monitoring, ED     Status: Abnormal   Collection Time: 12/02/21  8:45 AM  Result Value Ref Range   Glucose-Capillary 200 (H) 70 - 99 mg/dL  CBG monitoring, ED     Status: Abnormal   Collection Time: 12/02/21 10:09 AM  Result Value Ref Range   Glucose-Capillary 272 (H) 70 - 99 mg/dL  CBG monitoring, ED     Status: Abnormal   Collection Time: 12/02/21 11:17 AM  Result Value Ref Range   Glucose-Capillary 259 (H) 70 - 99 mg/dL  CBG monitoring, ED     Status: None   Collection Time: 12/02/21 12:21 PM  Result Value Ref Range   Glucose-Capillary 71 70 - 99 mg/dL  CBG monitoring, ED     Status: Abnormal   Collection Time: 12/02/21  1:20 PM  Result Value Ref Range   Glucose-Capillary 154 (H) 70 - 99 mg/dL  CBG monitoring, ED  Status: Abnormal   Collection Time: 12/02/21  2:28 PM  Result Value Ref Range   Glucose-Capillary 166 (H) 70 - 99 mg/dL  Glucose, capillary     Status: Abnormal   Collection Time: 12/02/21  4:13 PM  Result Value Ref Range   Glucose-Capillary 388 (H) 70 - 99 mg/dL  Glucose, capillary     Status: Abnormal   Collection Time: 12/02/21  5:27 PM  Result Value Ref Range   Glucose-Capillary 149 (H) 70 - 99 mg/dL  Glucose, capillary     Status: Abnormal    Collection Time: 12/02/21  6:30 PM  Result Value Ref Range   Glucose-Capillary 125 (H) 70 - 99 mg/dL  SARS Coronavirus 2 by RT PCR (hospital order, performed in [place] hospital lab) *cepheid single result test* Anterior Nasal Swab     Status: None   Collection Time: 12/02/21  7:03 PM   Specimen: Anterior Nasal Swab  Result Value Ref Range   SARS Coronavirus 2 by RT PCR NEGATIVE NEGATIVE  Glucose, capillary     Status: Abnormal   Collection Time: 12/02/21  7:34 PM  Result Value Ref Range   Glucose-Capillary 171 (H) 70 - 99 mg/dL  Glucose, capillary     Status: Abnormal   Collection Time: 12/02/21  8:32 PM  Result Value Ref Range   Glucose-Capillary 201 (H) 70 - 99 mg/dL  Glucose, capillary     Status: Abnormal   Collection Time: 12/02/21  9:31 PM  Result Value Ref Range   Glucose-Capillary 231 (H) 70 - 99 mg/dL  Glucose, capillary     Status: Abnormal   Collection Time: 12/02/21 10:33 PM  Result Value Ref Range   Glucose-Capillary 225 (H) 70 - 99 mg/dL  Glucose, capillary     Status: Abnormal   Collection Time: 12/02/21 11:33 PM  Result Value Ref Range   Glucose-Capillary 254 (H) 70 - 99 mg/dL  Glucose, capillary     Status: Abnormal   Collection Time: 12/03/21 12:35 AM  Result Value Ref Range   Glucose-Capillary 144 (H) 70 - 99 mg/dL  Glucose, capillary     Status: Abnormal   Collection Time: 12/03/21  1:38 AM  Result Value Ref Range   Glucose-Capillary 127 (H) 70 - 99 mg/dL  Glucose, capillary     Status: Abnormal   Collection Time: 12/03/21  2:35 AM  Result Value Ref Range   Glucose-Capillary 156 (H) 70 - 99 mg/dL  Glucose, capillary     Status: Abnormal   Collection Time: 12/03/21  3:33 AM  Result Value Ref Range   Glucose-Capillary 216 (H) 70 - 99 mg/dL  Beta-hydroxybutyric acid     Status: Abnormal   Collection Time: 12/03/21  4:12 AM  Result Value Ref Range   Beta-Hydroxybutyric Acid 0.32 (H) 0.05 - 0.27 mmol/L  CK     Status: None   Collection Time:  12/03/21  4:12 AM  Result Value Ref Range   Total CK 373 49 - 397 U/L  CBC     Status: None   Collection Time: 12/03/21  4:12 AM  Result Value Ref Range   WBC 4.6 4.0 - 10.5 K/uL   RBC 4.75 4.22 - 5.81 MIL/uL   Hemoglobin 13.9 13.0 - 17.0 g/dL   HCT 39.4 39.0 - 52.0 %   MCV 82.9 80.0 - 100.0 fL   MCH 29.3 26.0 - 34.0 pg   MCHC 35.3 30.0 - 36.0 g/dL   RDW 13.0 11.5 - 15.5 %  Platelets 152 150 - 400 K/uL   nRBC 0.0 0.0 - 0.2 %  Basic metabolic panel     Status: Abnormal   Collection Time: 12/03/21  4:12 AM  Result Value Ref Range   Sodium 152 (H) 135 - 145 mmol/L   Potassium 3.4 (L) 3.5 - 5.1 mmol/L   Chloride 118 (H) 98 - 111 mmol/L   CO2 26 22 - 32 mmol/L   Glucose, Bld 164 (H) 70 - 99 mg/dL   BUN 50 (H) 6 - 20 mg/dL   Creatinine, Ser 2.24 (H) 0.61 - 1.24 mg/dL   Calcium 10.0 8.9 - 10.3 mg/dL   GFR, Estimated 34 (L) >60 mL/min   Anion gap 8 5 - 15  Glucose, capillary     Status: Abnormal   Collection Time: 12/03/21  4:33 AM  Result Value Ref Range   Glucose-Capillary 152 (H) 70 - 99 mg/dL  Glucose, capillary     Status: Abnormal   Collection Time: 12/03/21  5:36 AM  Result Value Ref Range   Glucose-Capillary 201 (H) 70 - 99 mg/dL  Glucose, capillary     Status: Abnormal   Collection Time: 12/03/21  6:40 AM  Result Value Ref Range   Glucose-Capillary 156 (H) 70 - 99 mg/dL  Glucose, capillary     Status: Abnormal   Collection Time: 12/03/21  7:57 AM  Result Value Ref Range   Glucose-Capillary 148 (H) 70 - 99 mg/dL    Procedures Overnight EEG with video (8/14 - 8/15): This study is suggestive of moderate diffuse encephalopathy, most likely secondary to toxic-metabolic causes. No seizures were seen throughout the recording.  ASSESSMENT & PLAN  [name]. is a 56 y.o. male with PMHx of PTSD, bipolar disorder, schizoaffective disorder, questionable seizures, medication-induced tremors, substance abuse, dementia vs pseudodementia in the setting of his  psychiatric history and polypharmacy, T2DM, HTN, and peripheral neuropathy who is admitted for workup and management of acute encephalopathy.  Hospital day: 2  Principal Problem:   Encephalopathy acute Active Problems:   Diabetic ketoacidosis without coma associated with type 2 diabetes mellitus (HCC)  Acute encephalopathy, likely mixed toxic-metabolic C/f benzodiazepine withdrawal Differential for pt's encephalopathy is fairly broad and includes polypharmacy, drug or substance overdose/ toxicity/ withdrawal, seizure (although his history of seizure is unclear), rhabdomyolysis, benzodiazepine withdrawal, serotonin syndrome, and NMS. Our leading differential at this time is benzo withdrawal but we also cannot rule out opioid withdrawal; per conversation with his sister yesterday, his klonopin and percocet pill bottles are kept at his house and his sister is unsure what he takes from them. He had also presented with DKA; his anion gap closed yesterday. O/N EEG showed diffuse encephalopathy and had no seizure activity. We will continue with workup, supportive management, and benzo withdrawal treatment while monitoring his mental status. - Neurology consulted, appreciate recs - Scheduled IV ativan 0.5 mg q6hr  - mIVF (D5LR @ 125 mL/hr)  - Holding home psychotropic medications at this time - Per neuro, restart seroquel 50 mg nightly when pt is able to tolerate PO - Trend CBC and BMP - Follow BCx and UCx - BCx 2/4 with Staph, likely contaminant. Remaining BCx NGTD at 2 days - Keep NPO; SLP eval when able - PT/OT/TOC eval when able - Evaluate for orthostatics when able    H/o T2DM Anion gap metabolic acidosis - closed AG metabolic acidosis was suspected to be DKA given his ketosis and elevated blood glucoses. Anion gap closed and lactate normalized  yesterday. Beta-hydroxybutyrate has improved to 0.32 today. Pt is currently unable to take anything by PO due to his encephalopathy but his CBGs remain  elevated. We will switch from IV insulin to subq today with sensitive correction of SSI.  - Start semglee 15u daily + novolog 0-9u q4hr (sensitive correction) - Monitor CBGs - mIVF (D5LR @ 125 mL/hr)    AKI on CKD stage 4 Rhabdomyolysis CK has normalized to 373 today but Cr increased from 1.97 to 2.24. Baseline Cr previously noted to be around 2. Pt's rhabdo was most likely from being on the ground for up to 2 days. O/N EEG had no seizure activity. Will continue to monitor.  - mIVF - Trend BMP   Psychiatric comorbidities  Polypharmacy Pt has documented history of PTSD, bipolar disorder, schizoaffective disorder, questionable seizures, and medication-induced tremors. He is on multiple centrally-active medications including benzodiazepines. We are currently holding his home meds in the setting of encephalopathy and will consider restarting them as we workup his acute illness.  - Psych signed off for now - Continue holding home psychotropic medications  - Scheduled IV ativan 0.5 mg q6hr  Electrolyte derangements Na+ 152 and K+ 3.4 today. Likely in s/o NPO status and ongoing mIVF with D5LR. Will give KCl in 1/2 NS today to replete K+ and provide free water.   Checklist Diet: NPO IVF: mIVF (D5LR @ 125 mL/hr) + 1L of 1/2 NS w/ KCl 20 mEq  DVT PPx: Lovenox Electrolytes: Replete as indicated Code status: DNR  ------------------------  Lunette Stands, MS4 12/03/2021 Pager: [phone]

## 2021-12-03 NOTE — TOC Initial Note (Signed)
Transition of Care (TOC) - Initial/Assessment Note    Patient Details  Name: [name]. MRN: [id number] Date of Birth: 23-Aug-1965  Transition of Care [facility]) CM/SW Contact:    Pollie Friar, RN Phone Number: 12/03/2021, 3:18 PM  Clinical Narrative:                 Pt lives in an apartment alone but has an aide that comes M-F for 3 hours each day. She assists with meals/ cleaning/ reminding him to take his medications.  Pts sister sets up his pill box weekly at his home.  Pt either uses [facility]/ or medicaid transport or his sister takes him to appointments. Pt non verbal during visit. Will need therapies to evaluate once pt is off the EEG.  Sister states he has been to rehab in the past and she would be willing for him to attend again. Sister is his POA and CM confirmed the document is in the system. TOC following.  Expected Discharge Plan: Skilled Nursing Facility Barriers to Discharge: Continued Medical Work up   Patient Goals and CMS Choice   CMS Medicare.gov Compare Post Acute Care list provided to:: Patient Represenative (must comment) Choice offered to / list presented to : Sibling  Expected Discharge Plan and Services Expected Discharge Plan: [place]   Discharge Planning Services: CM Consult   Living arrangements for the past 2 months: Apartment                                      Prior Living Arrangements/Services Living arrangements for the past 2 months: Apartment Lives with:: Self Patient language and need for interpreter reviewed:: Yes          Care giver support system in place?: No (comment) Current home services: DME, Homehealth aide (rollator/ walker/ wheelchair) Criminal Activity/Legal Involvement Pertinent to Current Situation/Hospitalization: No - Comment as needed  Activities of Daily Living Home Assistive Devices/Equipment: None ADL Screening (condition at time of admission) Patient's cognitive ability adequate  to safely complete daily activities?: No Is the patient deaf or have difficulty hearing?: No Does the patient have difficulty seeing, even when wearing glasses/contacts?: No Does the patient have difficulty concentrating, remembering, or making decisions?: Yes Patient able to express need for assistance with ADLs?: No Does the patient have difficulty dressing or bathing?: Yes Independently performs ADLs?: No Does the patient have difficulty walking or climbing stairs?: Yes Weakness of Legs: Both Weakness of Arms/Hands: Both  Permission Sought/Granted                  Emotional Assessment Appearance:: Appears older than stated age            Admission diagnosis:  Dehydration [E86.0] Encephalopathy acute [G93.40] Acute encephalopathy [G93.40] AKI (acute kidney injury) ([place]) [N17.9] Traumatic rhabdomyolysis, initial encounter ([place]) [T79.6XXA] Patient Active Problem List   Diagnosis Date Noted   Diabetic ketoacidosis without coma associated with type 2 diabetes mellitus ([place])    Encephalopathy acute 12/01/2021   Type 2 diabetes mellitus with stage 4 chronic kidney disease, with long-term current use of insulin ([place]) 01/26/2021   Tachycardia 08/30/2020   Pressure injury of skin 01/02/2020   Dehydration    Rhabdomyolysis 01/01/2020   Recurrent falls 05/15/2019   AKI (acute kidney injury) ([place]) 05/14/2019   Closed displaced fracture of lateral malleolus of left fibula 08/02/2017   BPH (benign prostatic hyperplasia)  07/24/2017   Hemorrhoid 05/27/2017   Shoulder arthritis 05/27/2017   Diabetic neuropathy ([place]) 05/27/2017   Degenerative disc disease, lumbar 02/04/2017   Constipation    History of seizures 09/20/2016   Acute lower UTI    Urinary retention    HLD (hyperlipidemia) 09/07/2016   GERD (gastroesophageal reflux disease) 09/07/2016   PTSD (post-traumatic stress disorder) 09/07/2016   Fall 09/07/2016   Chronic kidney disease    Anorexia nervosa, restricting  type, in full remission, moderate 01/05/2016   Ataxia 10/14/2015   Hypokalemia 10/03/2015   Hemiparesis ([place])    Dyslipidemia 05/31/2014   Insomnia 11/11/2013   Degenerative joint disease (DJD) of hip 08/16/2013   Acute midline low back pain without sciatica 05/20/2011   Erectile dysfunction 02/17/2011   Constipation - functional 02/17/2011   Hypogonadism male 05/20/2010   Demoralization and apathy 05/20/2010   Schizoaffective disorder, bipolar type ([place]) 01/18/2010   Right shoulder pain 01/18/2010   Anxiety state 12/05/2006   Essential hypertension 12/05/2006   PCP:  [name], MD Pharmacy:   [facility] - [place], [place] - [address] [place] [address] Phone: [phone] Fax: [phone]  [facility] ([facility]) - [place], [place] [address] Phone: [phone] Fax: [place], [place] - [address] AT [place] [address] Phone: [phone] Fax: [phone]     Social Determinants of Health (SDOH) Interventions    Readmission Risk Interventions    01/02/2020    4:09 PM  Readmission Risk Prevention Plan  Transportation Screening Complete  PCP or Specialist Appt within 3-5 Days Complete  HRI or Home Care Consult Complete  Social Work Consult for [place] Planning/Counseling Complete  Palliative Care Screening Not Applicable  Medication Review (RN Care Manager) Referral to Pharmacy

## 2021-12-04 ENCOUNTER — Inpatient Hospital Stay (HOSPITAL_COMMUNITY): Payer: Medicare Other

## 2021-12-04 DIAGNOSIS — R4182 Altered mental status, unspecified: Secondary | ICD-10-CM | POA: Diagnosis not present

## 2021-12-04 DIAGNOSIS — E870 Hyperosmolality and hypernatremia: Secondary | ICD-10-CM | POA: Diagnosis not present

## 2021-12-04 DIAGNOSIS — E041 Nontoxic single thyroid nodule: Secondary | ICD-10-CM | POA: Insufficient documentation

## 2021-12-04 DIAGNOSIS — L98429 Non-pressure chronic ulcer of back with unspecified severity: Secondary | ICD-10-CM | POA: Diagnosis present

## 2021-12-04 DIAGNOSIS — G9340 Encephalopathy, unspecified: Secondary | ICD-10-CM | POA: Diagnosis not present

## 2021-12-04 LAB — RESPIRATORY PANEL BY PCR

## 2021-12-04 LAB — BASIC METABOLIC PANEL
Anion gap: 8 (ref 5–15)
BUN: 46 mg/dL — ABNORMAL HIGH (ref 6–20)
CO2: 25 mmol/L (ref 22–32)
Calcium: 9.6 mg/dL (ref 8.9–10.3)
Chloride: 113 mmol/L — ABNORMAL HIGH (ref 98–111)
Creatinine, Ser: 2.17 mg/dL — ABNORMAL HIGH (ref 0.61–1.24)
GFR, Estimated: 36 mL/min — ABNORMAL LOW (ref >60)
Glucose, Bld: 277 mg/dL — ABNORMAL HIGH (ref 70–99)
Potassium: 3.9 mmol/L (ref 3.5–5.1)
Sodium: 146 mmol/L — ABNORMAL HIGH (ref 135–145)

## 2021-12-04 LAB — GLUCOSE, CAPILLARY
Glucose-Capillary: 220 mg/dL — ABNORMAL HIGH (ref 70–99)
Glucose-Capillary: 236 mg/dL — ABNORMAL HIGH (ref 70–99)
Glucose-Capillary: 243 mg/dL — ABNORMAL HIGH (ref 70–99)
Glucose-Capillary: 245 mg/dL — ABNORMAL HIGH (ref 70–99)
Glucose-Capillary: 247 mg/dL — ABNORMAL HIGH (ref 70–99)
Glucose-Capillary: 264 mg/dL — ABNORMAL HIGH (ref 70–99)

## 2021-12-04 LAB — HEPATIC FUNCTION PANEL
ALT: 39 U/L (ref 0–44)
AST: 58 U/L — ABNORMAL HIGH (ref 15–41)
Albumin: 3.1 g/dL — ABNORMAL LOW (ref 3.5–5.0)
Alkaline Phosphatase: 41 U/L (ref 38–126)
Bilirubin, Direct: 0.2 mg/dL (ref 0.0–0.2)
Indirect Bilirubin: 0.9 mg/dL (ref 0.3–0.9)
Total Bilirubin: 1.1 mg/dL (ref 0.3–1.2)
Total Protein: 6.1 g/dL — ABNORMAL LOW (ref 6.5–8.1)

## 2021-12-04 LAB — MRSA NEXT GEN BY PCR, NASAL: MRSA by PCR Next Gen: NOT DETECTED

## 2021-12-04 LAB — CK: Total CK: 180 U/L (ref 49–397)

## 2021-12-04 LAB — CBC
HCT: 38.4 % — ABNORMAL LOW (ref 39.0–52.0)
Hemoglobin: 12.9 g/dL — ABNORMAL LOW (ref 13.0–17.0)
MCH: 28.9 pg (ref 26.0–34.0)
MCHC: 33.6 g/dL (ref 30.0–36.0)
MCV: 86.1 fL (ref 80.0–100.0)
Platelets: 126 10*3/uL — ABNORMAL LOW (ref 150–400)
RBC: 4.46 MIL/uL (ref 4.22–5.81)
RDW: 12.8 % (ref 11.5–15.5)
WBC: 5.1 10*3/uL (ref 4.0–10.5)
nRBC: 0 % (ref 0.0–0.2)

## 2021-12-04 LAB — CULTURE, BLOOD (ROUTINE X 2): Special Requests: ADEQUATE

## 2021-12-04 LAB — LACTIC ACID, PLASMA
Lactic Acid, Venous: 1 mmol/L (ref 0.5–1.9)
Lactic Acid, Venous: 1 mmol/L (ref 0.5–1.9)

## 2021-12-04 LAB — PROCALCITONIN: Procalcitonin: 0.12 ng/mL

## 2021-12-04 MED ORDER — LORAZEPAM 2 MG/ML IJ SOLN
0.5000 mg | Freq: Once | INTRAMUSCULAR | Status: AC
  Administered 2021-12-04: 0.5 mg via INTRAVENOUS
  Filled 2021-12-04: qty 1

## 2021-12-04 MED ORDER — VANCOMYCIN HCL 1750 MG/350ML IV SOLN
1750.0000 mg | Freq: Once | INTRAVENOUS | Status: AC
  Administered 2021-12-04: 1750 mg via INTRAVENOUS
  Filled 2021-12-04: qty 350

## 2021-12-04 MED ORDER — LORAZEPAM 2 MG/ML IJ SOLN
2.0000 mg | Freq: Four times a day (QID) | INTRAMUSCULAR | Status: DC
  Administered 2021-12-04 – 2021-12-07 (×11): 2 mg via INTRAVENOUS
  Filled 2021-12-04 (×11): qty 1

## 2021-12-04 MED ORDER — DEXTROSE IN LACTATED RINGERS 5 % IV SOLN: INTRAVENOUS | Status: DC

## 2021-12-04 MED ORDER — DEXTROSE 5 % IV SOLN: INTRAVENOUS | Status: DC

## 2021-12-04 MED ORDER — METRONIDAZOLE 500 MG/100ML IV SOLN
500.0000 mg | Freq: Three times a day (TID) | INTRAVENOUS | Status: DC
  Administered 2021-12-04: 500 mg via INTRAVENOUS
  Filled 2021-12-04: qty 100

## 2021-12-04 MED ORDER — INSULIN GLARGINE-YFGN 100 UNIT/ML ~~LOC~~ SOLN
20.0000 [IU] | Freq: Every day | SUBCUTANEOUS | Status: DC
  Administered 2021-12-04 – 2021-12-05 (×2): 20 [IU] via SUBCUTANEOUS
  Filled 2021-12-04 (×3): qty 0.2

## 2021-12-04 MED ORDER — VANCOMYCIN HCL 1500 MG/300ML IV SOLN
1500.0000 mg | INTRAVENOUS | Status: DC
  Administered 2021-12-05: 1500 mg via INTRAVENOUS
  Filled 2021-12-04: qty 300

## 2021-12-04 MED ORDER — LORAZEPAM 2 MG/ML IJ SOLN
1.0000 mg | Freq: Four times a day (QID) | INTRAMUSCULAR | Status: DC
  Administered 2021-12-04: 1 mg via INTRAVENOUS
  Filled 2021-12-04: qty 1

## 2021-12-04 MED ORDER — ORAL CARE MOUTH RINSE
15.0000 mL | OROMUCOSAL | Status: DC
  Administered 2021-12-04 – 2021-12-05 (×5): 15 mL via OROMUCOSAL

## 2021-12-04 MED ORDER — SODIUM CHLORIDE 0.9 % IV SOLN
2.0000 g | INTRAVENOUS | Status: DC
  Administered 2021-12-04: 2 g via INTRAVENOUS
  Filled 2021-12-04: qty 20

## 2021-12-04 MED ORDER — SODIUM CHLORIDE 3 % IN NEBU
4.0000 mL | INHALATION_SOLUTION | Freq: Two times a day (BID) | RESPIRATORY_TRACT | Status: AC | PRN
Start: 2021-12-04 — End: 2021-12-07
  Administered 2021-12-04: 4 mL via RESPIRATORY_TRACT
  Filled 2021-12-04 (×2): qty 4

## 2021-12-04 NOTE — Progress Notes (Signed)
vLTM discontinued no skin breakdown noted at all skin sites  [facility] notified

## 2021-12-04 NOTE — Progress Notes (Addendum)
LTM maint complete - no skin breakdown under: Fp2 F4 [facility] monitored, Event button test confirmed by [facility].

## 2021-12-04 NOTE — Progress Notes (Signed)
SUBJECTIVE  Chief complaint Altered mental status  Summary [name]. is a 56 y.o. male with PMHx of PTSD, bipolar disorder, schizoaffective disorder, questionable seizures, medication-induced tremors, substance abuse, polypharmacy, T2DM, and HTN who presented to the ED on 12/01/2021 with altered mental status. He was last known to be at his baseline on 11/29/2021.   Overnight events Spoke to sister [name] over the phone. [name] was able to get in touch with pt's home health aide, who informed her that pt had been taking 2 percocet daily recently as he has been prescribed but at the same time instead of 1 pill in the AM and 1 pill in the PM. His pill count was as prescribed. [name] also found his klonopin bottle and noted it is actually full. He has asked for klonopin refills in the past so this was surprising to her and she thinks he may be "storing" klonopin at times and taking an unknown amount of klonopin at other times. [name] notes that when she saw him in the hospital yesterday afternoon, he was vocalizing some and "said he doesn't know who I [name]) is."   The O/N team was paged for a fever of 103 F. He had received 1 hypertonic saline neb earlier in the night because he seemed to be unable to clear his throat when he was coughing. On exam, he had diffuse rhonchi with slightly decreased breath sounds on the left but these findings were present the night before as well. Infectious workup was initiated including BCx x2, UCx, CXR, and broad-spectrum abx.   Today's interview Pt does not engage with interview. Family was not present during the interview.   Review of systems ROS is unattainable due to pt's altered mental status.   OBJECTIVE  Vitals BP (!) 160/115 (BP Location: Left Arm)   Pulse (!) 122   Temp (!) 102 F (38.9 C) (Axillary)   Resp (!) 35   Ht '6\' 3"'$  (1.905 m)   Wt 91.9 kg   SpO2 96%   BMI 25.32 kg/m   Physical exam Constitutional: Awake but not oriented. Appears  disheveled and malnourished. Eyes: PERRL. Pupils dilated to 5 mm bilaterally. Sclera anicteric.  HENT: Normocephalic and atraumatic. Mucous membranes are dry. Pt had a ~2 cm wad of dried mucus stuck to the roof of his mouth which we removed with wet sponges and tweezers.  Cardiovascular: Tachycardic. Regular rhythm. No M/R/G.  Respiratory: Scattered rhonchi. No crackles or wheezes. Tachypneic with belly breathing, not using accessory muscles.  Gastrointestinal: Soft, NT/ND.  Musculoskeletal: Extremeties are warm. No clubbing, cyanosis, or edema. +2 pulses in BUE and BLE. Bilateral great toes with erythema, improved from prior.  Skin: BLE with multiple excoriations. Scattered excoriations and nonblanching petechiae of BUE, stable from prior.  Neurologic/ psychiatric: A&O x0. Moves all four extremeties spontaneously. Generally tremulous but BUE tremors worsens with passive lifting. Resists extension of BUE with rigidity. Tracks eyes to voice. Slightly shakes head side to side when asked if in any pain. Does not attempt to verbalize. Knee-jerk reflex present, no hyperreflexia. No clonus. Pt does not have gag reflex but does grimace when doing gag exam.  Labs, studies, and imaging Labs, studies, and imaging from the last 24 hours per EMR and personally reviewed.  Results for orders placed or performed during the hospital encounter of 12/01/21 (from the past 24 hour(s))  Glucose, capillary     Status: Abnormal   Collection Time: 12/03/21 12:10 PM  Result Value Ref Range  Glucose-Capillary 172 (H) 70 - 99 mg/dL  Glucose, capillary     Status: Abnormal   Collection Time: 12/03/21  4:03 PM  Result Value Ref Range   Glucose-Capillary 318 (H) 70 - 99 mg/dL  Glucose, capillary     Status: Abnormal   Collection Time: 12/03/21  8:09 PM  Result Value Ref Range   Glucose-Capillary 215 (H) 70 - 99 mg/dL  Glucose, capillary     Status: Abnormal   Collection Time: 12/03/21 11:08 PM  Result Value Ref Range    Glucose-Capillary 227 (H) 70 - 99 mg/dL   Comment 1 Notify RN    Comment 2 Document in Chart   Glucose, capillary     Status: Abnormal   Collection Time: 12/04/21  3:34 AM  Result Value Ref Range   Glucose-Capillary 247 (H) 70 - 99 mg/dL  Basic metabolic panel     Status: Abnormal   Collection Time: 12/04/21  4:29 AM  Result Value Ref Range   Sodium 146 (H) 135 - 145 mmol/L   Potassium 3.9 3.5 - 5.1 mmol/L   Chloride 113 (H) 98 - 111 mmol/L   CO2 25 22 - 32 mmol/L   Glucose, Bld 277 (H) 70 - 99 mg/dL   BUN 46 (H) 6 - 20 mg/dL   Creatinine, Ser 2.17 (H) 0.61 - 1.24 mg/dL   Calcium 9.6 8.9 - 10.3 mg/dL   GFR, Estimated 36 (L) >60 mL/min   Anion gap 8 5 - 15  CBC     Status: Abnormal   Collection Time: 12/04/21  4:29 AM  Result Value Ref Range   WBC 5.1 4.0 - 10.5 K/uL   RBC 4.46 4.22 - 5.81 MIL/uL   Hemoglobin 12.9 (L) 13.0 - 17.0 g/dL   HCT 38.4 (L) 39.0 - 52.0 %   MCV 86.1 80.0 - 100.0 fL   MCH 28.9 26.0 - 34.0 pg   MCHC 33.6 30.0 - 36.0 g/dL   RDW 12.8 11.5 - 15.5 %   Platelets 126 (L) 150 - 400 K/uL   nRBC 0.0 0.0 - 0.2 %  Procalcitonin - Baseline     Status: None   Collection Time: 12/04/21  4:29 AM  Result Value Ref Range   Procalcitonin 0.12 ng/mL  Lactic acid, plasma     Status: None   Collection Time: 12/04/21  5:40 AM  Result Value Ref Range   Lactic Acid, Venous 1.0 0.5 - 1.9 mmol/L  Glucose, capillary     Status: Abnormal   Collection Time: 12/04/21  8:11 AM  Result Value Ref Range   Glucose-Capillary 243 (H) 70 - 99 mg/dL   Comment 1 Notify RN    Comment 2 Document in Chart   Lactic acid, plasma     Status: None   Collection Time: 12/04/21  8:16 AM  Result Value Ref Range   Lactic Acid, Venous 1.0 0.5 - 1.9 mmol/L  CK     Status: None   Collection Time: 12/04/21  8:16 AM  Result Value Ref Range   Total CK 180 49 - 397 U/L  Hepatic function panel     Status: Abnormal   Collection Time: 12/04/21  8:16 AM  Result Value Ref Range   Total Protein 6.1  (L) 6.5 - 8.1 g/dL   Albumin 3.1 (L) 3.5 - 5.0 g/dL   AST 58 (H) 15 - 41 U/L   ALT 39 0 - 44 U/L   Alkaline Phosphatase 41 38 - 126 U/L  Total Bilirubin 1.1 0.3 - 1.2 mg/dL   Bilirubin, Direct 0.2 0.0 - 0.2 mg/dL   Indirect Bilirubin 0.9 0.3 - 0.9 mg/dL   CXR (8/16): Low volume film with left base atelectasis.  CT head wo contrast (8/16): Mild atrophy, no acute abnormality, no change from the recent study.    Procedures EEG (8/15 - 8/16): Periodic discharges with triphasic morphology, generalized (GPDs); continuous slow, generalized. Suggestive of moderate to severe diffuse encephalopathy, most likely secondary to toxic-metabolic causes. No seizures were seen throughout the recording.  ASSESSMENT & PLAN  [name]. is a 56 y.o. male with PMHx of PTSD, bipolar disorder, schizoaffective disorder, questionable seizures, medication-induced tremors, substance abuse, dementia vs pseudodementia in the setting of his psychiatric history and polypharmacy, T2DM, HTN, and peripheral neuropathy who is admitted for workup and management of acute encephalopathy.  Hospital day: 3  Principal Problem:   Encephalopathy acute Active Problems:   Diabetic ketoacidosis without coma associated with type 2 diabetes mellitus (HCC)  Acute encephalopathy, likely mixed toxic-metabolic Suspected psychotropic withdrawal w/ fever vs hyperthermia Pt had a low-grade temperature on hospital presentation but fever has worsened since last night to 102-103 F despite having received tylenol suppository. He also remains hypertensive up to the 341'P systolic, tachycardic, and tachypneic with diffuse rigidity on exam. Repeat BCx and UCx were collected and he was started on abx. However, we still have low suspicion for infectious etiology - RPP was negative; CXR showed L base atelectasis but no edema or focal consolidation; CBC, BMP, and hepatic function panel today are unrevealing with no leukocytosis; lactate,  procalcitonin, and CK were normal; prior BCx from 8/13 grew Staph haemolyticus and staph epidermidis in 2/4 bottles which was likely contaminant; per wound care note he has stage 2 pressure wounds to the sacrum and R trochanter but no open wounds. EEG remained consistent with toxic-metabolic encephalopathy and showed no seizure activity. Repeat head CT today showed mild atrophy which is unchanged from prior and no other acute abnormalities. We discussed pt's case with psychiatry and neurology, and his clinical picture remains most concerning for withdrawal from his psychotropic medications particularly because of his polypharmacy and unclear adherence. NMS is possible but he has not been on antipsychotics in several days while in the hospital, the ones he is prescribed (seroquel and abilify) are less likely to cause NMS, and he has been on them for quite some time. However, rapid discontinuation of antipsychotics (particularly seroquel) can cause psychosis or rebound mania/ agitation. He is not on any serotonergic medications right now either to suspect serotonin syndrome. Malignant catatonia can be considered given his rigidity but it would be very rare. At this time, we will increase his ativan dose to provide him with a benzodiazepine challenge and then reevaluate.   - Increase IV ativan to 2 mg q6hr - Stop CIWA PRN ativan to clarify dosing/ administration  - Consider gradually restarting psychotropic medications tomorrow (8/17) - Consider psych re-consult tomorrow  - Continue IV vancomycin today - Trend CBC - F/u new BCx and UCx  - Plan to stop abx tomorrow - Continue mIVF - PRN tylenol + ice packs for fever - Ensure oral care and secretion clearance by nursing  Hyperglycemia H/o T2DM Pt was switched from IV insulin to subq yesterday but his CBGs have been persistently elevated in the 200's. We will increase the basal insulin dose and continue to monitor.  - Increase semglee to 20u daily -  Continue novolog 0-9u q4hr (sensitive correction) -  Monitor CBGs   Hypernatremia Na+ 146 today but corrected to 150 given pt's hyperglycemia. Likely in s/o NPO status and potential fluid losses from his encephalopathic illness as above.  - Continue mIVF (D5W @ 125 mL/hr)   AKI on CKD stage 4 Cr this AM was 2.14, down from 2.24 yesterday. Pt's baseline Cr was previously noted to be around 2. CK remains normal today. Will continue to give fluids and monitor.  - mIVF - Trend BMP  Checklist Diet: NPO IVF: D5W @ 125 mL/hr DVT PPx: Lovenox Electrolytes: Replete as indicated Code status: DNR  ------------------------  Lunette Stands, MS4 12/04/2021 Pager: [phone]

## 2021-12-04 NOTE — Inpatient Diabetes Management (Addendum)
Inpatient Diabetes Program Recommendations  AACE/ADA: New Consensus Statement on Inpatient Glycemic Control (2015)  Target Ranges:  Prepandial:   less than 140 mg/dL      Peak postprandial:   less than 180 mg/dL (1-2 hours)      Critically ill patients:  140 - 180 mg/dL   Lab Results  Component Value Date   GLUCAP 247 (H) 12/04/2021   HGBA1C 8.0 (H) 12/02/2021    Review of Glycemic Control  Latest Reference Range & Units 12/03/21 16:03 12/03/21 20:09 12/03/21 23:08 12/04/21 03:34  Glucose-Capillary 70 - 99 mg/dL 318 (H) 215 (H) 227 (H) 247 (H)  (H): Data is abnormally high  Latest Reference Range & Units 12/04/21 04:29  Glucose 70 - 99 mg/dL 277 (H)  (H): Data is abnormally high  Diabetes history: DM2 Outpatient Diabetes medications:  Lantus 45-50 units QD Humalog 10-14 units TID Current orders for Inpatient glycemic control:  Semglee 20 QD (increased from 15 yesterday) Novolog 0-9 units Q4H-NPO   Inpatient Diabetes Program Recommendations:     -Semglee 26 units  Will continue to follow while inpatient.  Thank you, Reche Dixon, MSN, [place] Diabetes Coordinator Inpatient Diabetes Program [phone] (team pager from 8a-5p)

## 2021-12-04 NOTE — Progress Notes (Signed)
Paged about fever of 103 degrees, went to evaluate.  No provoking events.  No new symptoms observed, including worsening cough, seizure-like activity, increased agitation.  No new medications added on today, except for restarting Ativan yesterday morning.  I had previously added on hypertonic saline nebulizer for treatment of  inadequate adequate airway clearing and congestion.  He did get one of those treatments during the night.  RN mentioned that during routine vital check she noticed temperature of 103 patient is now flagging MEWS red.  Vitals: T103, BP 149/106, heart rate 122, respiratory rate 30s to 40s, satting 94% on 2 L nasal cannula.  Telemetry review reveals no significant changes except for slightly worsening sinus tachycardia. Exam: General: Appears stable, somnolent, minimally interactive similar to prior exams Cardiac: Tachycardic with regular rhythm, no new murmurs rubs or gallops Respiratory: Diffuse rhonchi with slightly decreased breath sounds on the left compared to the right.  This is not new, and was present last night. Abdominal: Soft, nondistended Neuro: Opens eyes spontaneously and tracks, normal flexion and grimaces to noxious stimuli, does not attempt to verbalize.  Resists extension of upper extremities with increased tone.  Baseline resting tremor.  No clonus.  GCS 9.  Patient had chest x-ray yesterday morning which appeared normal, but has consistent diffuse rhonchi and congestion on respiratory exam.  Was paged earlier tonight about inadequate airway clearance, poor cough with suction producing minimal sputum and started hypertonic nebs at that time.  This has not seemed to help much.  Neuro exam seems to be mostly stable with what has been described during the day, though he is not moving extremities to instructions.  No new meds added over the course of the day except for restarting Ativan so would have low suspicion of serotonin syndrome or other toxic etiology at this  time.  Very unlikely to be seizures given lack of symptoms and previously negative EEGs.  Low suspicion of new cranial bleed or acute infarct given no new FND.  Current differential includes aspiration pneumonia as most likely option followed by primary neurological infection such as meningitis/encephalitis with either being the source for sepsis.  Urinary source is less likely but possible as well. - Blood cultures x2 then start broad spectrum antibiotic coverage - Urine cultures - Continue hypertonic nebs and add chest physiotherapy.  Maintain O2 sat above 92% with supplemental oxygen.  If GCS further declines, consider intubation. - Stat CBC, BMP, procalcitonin, lactic acid - Stat chest x-ray - Tylenol for fever - Consider LP/repeat imaging if suspicion for meningitis/encephalitis increases.

## 2021-12-04 NOTE — Progress Notes (Signed)
Pharmacy Antibiotic Note  [name]. is a 56 y.o. male admitted on 12/01/2021 after being found on the floor minimally responsive.  Pharmacy has been consulted for vancomycin dosing. Patient developed fever overnight 102.5, HR 122, RR 38  Plan: Vancomycin '1750mg'$  IV x1 then '1500mg'$  IV qd (eAUC 536)  Height: '6\' 3"'$  (190.5 cm) Weight: 91.9 kg (202 lb 9.6 oz) IBW/kg (Calculated) : 84.5  Temp (24hrs), Avg:100.4 F (38 C), Min:98.8 F (37.1 C), Max:103 F (39.4 C)  Recent Labs  Lab 12/01/21 1740 12/01/21 2200 12/02/21 0222 12/02/21 0439 12/02/21 0804 12/03/21 0412 12/04/21 0429  WBC 10.6*  --   --  4.5  --  4.6 5.1  CREATININE 3.24* 2.24*  --  2.70* 1.97* 2.24* 2.17*  LATICACIDVEN 2.9* 1.6 1.6  --   --   --   --     Estimated Creatinine Clearance: 47.1 mL/min (A) (by C-G formula based on SCr of 2.17 mg/dL (H)).    Allergies  Allergen Reactions   Vicodin [Hydrocodone-Acetaminophen] Itching    Antimicrobials this admission: Vancomcyin 8/16>>  Microbiology results: pending  Thank you for allowing pharmacy to be a part of this patient's care.  Jodean Lima Jahbari Repinski 12/04/2021 4:59 AM

## 2021-12-04 NOTE — Consult Note (Signed)
[facility] Nurse Consult Note: Reason for Consult: pressure injury Patient had a fall at home, was down. Non verbal, EEG in place at the time of my assessment. He will not answer my questions but will track with his eyes. No family in the room.  Patient has excoriated skin from the knees down, unclear where all the scratches came from but they are limited to this area.  Wound type: Stage 2 Pressure Injury: right trochanter Stage 2 Pressure Injury: sacrum   Pressure Injury POA: Yes Measurement:  Right trochanter: 2cm x 2.5cm x 0.1cm  Sacrum: 1.5cm x 1.5cm x 0.1cm  Wound RFF:MBWG clean and pink Drainage (amount, consistency, odor) none  Periwound: intact  Dressing procedure/placement/frequency: Continue silicone foam to the sacrum and right trochanter; change every 3 days and PRN soilage. Lift to inspect each shift.   Turn and reposition patient at least every 2 hours.   Discussed POC with bedside nurse.  Re consult if needed, will not follow at this time. Thanks  Kyliah Deanda [employer], RN,CWOCN, CNS, [place] [phone])

## 2021-12-04 NOTE — Progress Notes (Addendum)
Subjective: Continues to be mute.  Does look at the examiner but does not follow commands.  Says ouch to noxious stimulation.  ROS: Unable to obtain due to poor mental status  Examination  Vital signs in last 24 hours: Temp:  [99 F (37.2 C)-103 F (39.4 C)] 102.6 F (39.2 C) (08/16 0809) Pulse Rate:  [115-122] 122 (08/16 0333) Resp:  [20-38] 33 (08/16 0809) BP: (139-163)/(84-115) 163/102 (08/16 0809) SpO2:  [94 %-100 %] 95 % (08/16 0809)   General: lying in bed, appears frail Extremities: multiple scratch marks on the legs  Neuro: awake, looks at examiner and tracks in room but didn't follow commands, unable to elicit vertical gaze movements, no facial asymmetry, increased tone/rigidity in all extremities, withdraws to  noxious stimulation, no BL ankle clonus  Basic Metabolic Panel: Recent Labs  Lab 12/01/21 2200 12/02/21 0439 12/02/21 0804 12/03/21 0412 12/04/21 0429  NA 141 146* 147* 152* 146*  K 4.7 4.4 3.8 3.4* 3.9  CL 108 110 119* 118* 113*  CO2 13* 20* 16* 26 25  GLUCOSE 241* 185* 165* 164* 277*  BUN 52* 63* 51* 50* 46*  CREATININE 2.24* 2.70* 1.97* 2.24* 2.17*  CALCIUM 8.7* 10.1 7.8* 10.0 9.6    CBC: Recent Labs  Lab 12/01/21 1740 12/02/21 0439 12/03/21 0412 12/04/21 0429  WBC 10.6* 4.5 4.6 5.1  HGB 15.3 15.5 13.9 12.9*  HCT 45.8 45.6 39.4 38.4*  MCV 85.9 84.8 82.9 86.1  PLT 222 219 152 126*     Coagulation Studies: Recent Labs    12/01/21 1740  LABPROT 14.8  INR 1.2    Imaging No new brain imaging overnight   ASSESSMENT AND PLAN:  56 year old with a significant psychiatric past medical history presenting for evaluation of altered mental status after having been found down with last known well 2 days ago. No witnessed seizures although he was found in a pool of his urine and feces.   Acute encephalopathy Rhabdomyolysis Hypernatremia Hypoproteinemia with hypoalbuminemia Transaminitis Rhabdomyolysis (resolved) Lactic acidosis  (resolved) -Symptoms most likely due to catatonia versus neuroleptic malignant syndrome (does have altered mental status, rigidity, hyperthermia but normal CK and only mildly elevated AST) versus serotonin syndrome (does not have clonus and has not been on medications at least since admission) -Unlikely meningitis/encephalitis as patient appears to be awake and tracking but unable to speak, no leukocytosis  Recommendations - EEG didn't show any definite interictal-ictal abnormality. Will dc  - Agree with increase lorazepam to 1 mg every 6 hours -Recommend consulting psychiatry as patient is on multiple medications for his psychiatric issue and they can help to avoid withdrawal as well as to safely resume medications and simplify his medication regimen if possible - Management of rest of comorbidities per primary team -Neurology will follow peripherally.  Please call for any further questions   I have spent a total of  40  minutes with the patient reviewing hospital notes,  test results, labs and examining the patient as well as establishing an assessment and plan with the medicine team as well as psychiatry team.  > 50% of time was spent in direct patient care.   Zeb Comfort Epilepsy [facility] For questions after 5pm please refer to AMION to reach the Neurologist on call

## 2021-12-04 NOTE — Procedures (Addendum)
Patient Name: [name].  MRN: [id number]  Epilepsy Attending: Lora Havens  Referring Physician/Provider: Riesa Pope, MD Duration: 12/03/2021 1853 to 12/04/2021 1248   Patient history: A 56 year old male with altered mental status.  EEG to evaluate for seizure.   Level of alertness: lethargic   AEDs during EEG study: Ativan   Technical aspects: This EEG study was done with scalp electrodes positioned according to the 10-20 International system of electrode placement. Electrical activity was reviewed with band pass filter of 1-'70Hz'$ , sensitivity of 7 uV/mm, display speed of 68m/sec with a '60Hz'$  notched filter applied as appropriate. EEG data were recorded continuously and digitally stored.  Video monitoring was available and reviewed as appropriate.   Description:  EEG showed continuous generalized 3 to 5 Hz theta-delta slowing. Generalized periodic discharges with triphasic morphology at 1 Hz were also noted, predominantly when awake/stimulated. Hyperventilation and photic stimulation were not performed.       ABNORMALITY - Periodic discharges with triphasic morphology, generalized ( GPDs) - Continuous slow, generalized   IMPRESSION: This study is suggestive of moderate to severe diffuse encephalopathy, most likely secondary to toxic-metabolic causes.  No seizures were seen throughout the recording.  [name] [name]

## 2021-12-05 DIAGNOSIS — E1165 Type 2 diabetes mellitus with hyperglycemia: Secondary | ICD-10-CM

## 2021-12-05 DIAGNOSIS — N179 Acute kidney failure, unspecified: Secondary | ICD-10-CM | POA: Diagnosis not present

## 2021-12-05 DIAGNOSIS — E870 Hyperosmolality and hypernatremia: Secondary | ICD-10-CM | POA: Diagnosis not present

## 2021-12-05 DIAGNOSIS — G9349 Other encephalopathy: Secondary | ICD-10-CM | POA: Diagnosis not present

## 2021-12-05 DIAGNOSIS — Z794 Long term (current) use of insulin: Secondary | ICD-10-CM

## 2021-12-05 LAB — BASIC METABOLIC PANEL
Anion gap: 8 (ref 5–15)
BUN: 41 mg/dL — ABNORMAL HIGH (ref 6–20)
CO2: 26 mmol/L (ref 22–32)
Calcium: 9.4 mg/dL (ref 8.9–10.3)
Chloride: 112 mmol/L — ABNORMAL HIGH (ref 98–111)
Creatinine, Ser: 2.02 mg/dL — ABNORMAL HIGH (ref 0.61–1.24)
GFR, Estimated: 39 mL/min — ABNORMAL LOW (ref >60)
Glucose, Bld: 234 mg/dL — ABNORMAL HIGH (ref 70–99)
Potassium: 3.7 mmol/L (ref 3.5–5.1)
Sodium: 146 mmol/L — ABNORMAL HIGH (ref 135–145)

## 2021-12-05 LAB — GLUCOSE, CAPILLARY
Glucose-Capillary: 189 mg/dL — ABNORMAL HIGH (ref 70–99)
Glucose-Capillary: 232 mg/dL — ABNORMAL HIGH (ref 70–99)
Glucose-Capillary: 232 mg/dL — ABNORMAL HIGH (ref 70–99)
Glucose-Capillary: 240 mg/dL — ABNORMAL HIGH (ref 70–99)
Glucose-Capillary: 252 mg/dL — ABNORMAL HIGH (ref 70–99)
Glucose-Capillary: 260 mg/dL — ABNORMAL HIGH (ref 70–99)

## 2021-12-05 LAB — CBC
HCT: 38.2 % — ABNORMAL LOW (ref 39.0–52.0)
Hemoglobin: 13 g/dL (ref 13.0–17.0)
MCH: 28.8 pg (ref 26.0–34.0)
MCHC: 34 g/dL (ref 30.0–36.0)
MCV: 84.7 fL (ref 80.0–100.0)
Platelets: 141 10*3/uL — ABNORMAL LOW (ref 150–400)
RBC: 4.51 MIL/uL (ref 4.22–5.81)
RDW: 12.4 % (ref 11.5–15.5)
WBC: 7.2 10*3/uL (ref 4.0–10.5)
nRBC: 0.3 % — ABNORMAL HIGH (ref 0.0–0.2)

## 2021-12-05 LAB — URINE CULTURE: Culture: 30000 — AB

## 2021-12-05 MED ORDER — INSULIN GLARGINE-YFGN 100 UNIT/ML ~~LOC~~ SOLN
25.0000 [IU] | Freq: Every day | SUBCUTANEOUS | Status: DC
  Administered 2021-12-06 – 2021-12-09 (×4): 25 [IU] via SUBCUTANEOUS
  Filled 2021-12-05 (×4): qty 0.25

## 2021-12-05 MED ORDER — ORAL CARE MOUTH RINSE: 15.0000 mL | OROMUCOSAL | Status: DC | PRN

## 2021-12-05 NOTE — Plan of Care (Signed)
Patient is non verbal, unable to follow commands. With spontaneous eye opening. VS during the shift was stable, HR 90's-110's, RR 25-30's, afebrile, no desaturation at room air. Not in distress. Suctioned secretions as needed. Will continue to monitor.  Problem: Metabolic: Goal: Ability to maintain appropriate glucose levels will improve Outcome: Progressing   Problem: Safety: Goal: Ability to remain free from injury will improve Outcome: Progressing   Problem: Skin Integrity: Goal: Risk for impaired skin integrity will decrease Outcome: Progressing

## 2021-12-05 NOTE — Plan of Care (Signed)
  Problem: Fluid Volume: Goal: Ability to maintain a balanced intake and output will improve Outcome: Progressing   Problem: Tissue Perfusion: Goal: Adequacy of tissue perfusion will improve Outcome: Progressing   Problem: Clinical Measurements: Goal: Will remain free from infection Outcome: Progressing   Problem: Coping: Goal: Level of anxiety will decrease Outcome: Progressing   Problem: Elimination: Goal: Will not experience complications related to bowel motility Outcome: Progressing Goal: Will not experience complications related to urinary retention Outcome: Progressing   Problem: Safety: Goal: Ability to remain free from injury will improve Outcome: Progressing

## 2021-12-05 NOTE — Inpatient Diabetes Management (Signed)
Inpatient Diabetes Program Recommendations  AACE/ADA: New Consensus Statement on Inpatient Glycemic Control (2015)  Target Ranges:  Prepandial:   less than 140 mg/dL      Peak postprandial:   less than 180 mg/dL (1-2 hours)      Critically ill patients:  140 - 180 mg/dL   Lab Results  Component Value Date   GLUCAP 252 (H) 12/05/2021   HGBA1C 8.0 (H) 12/02/2021    Review of Glycemic Control  Latest Reference Range & Units 12/04/21 08:11 12/04/21 11:37 12/04/21 15:14 12/04/21 19:36 12/04/21 23:41 12/05/21 03:53 12/05/21 08:39  Glucose-Capillary 70 - 99 mg/dL 243 (H) 264 (H) 236 (H) 245 (H) 220 (H) 260 (H) 252 (H)  (H): Data is abnormally high  Diabetes history: DM2 Outpatient Diabetes medications:  Lantus 45-50 units QD Humalog 10-14 units TID Current orders for Inpatient glycemic control:  Semglee 20 QD (increased from 15 yesterday) Novolog 0-9 units Q4H-NPO   Inpatient Diabetes Program Recommendations:     -Semglee 26 units   Will continue to follow while inpatient.   Thank you, Reche Dixon, MSN, [place] Diabetes Coordinator Inpatient Diabetes Program [phone] (team pager from 8a-5p)

## 2021-12-05 NOTE — Progress Notes (Signed)
Initial Nutrition Assessment  DOCUMENTATION CODES:   Non-severe (moderate) malnutrition in context of social or environmental circumstances  INTERVENTION:  Recommend placement of cortrak tube 8/18 as a reliable means of nutrition and medication administration.  Recommend the following TF regimen: Glucerna 1.5 @ 64m/h (16850md) Start at 20, advance by 10 q8h to goal.  Free water flush 12575m4h This provides 2340kcal, 129g protein, and 2025m48mter (flush +TF) Pt at risk for refeeding. Monitor labs x 48 hours and replace as needed  NUTRITION DIAGNOSIS:  Moderate Malnutrition related to social / environmental circumstances (mental illness) as evidenced by mild muscle depletion, mild fat depletion.  GOAL:  Patient will meet greater than or equal to 90% of their needs  MONITOR:  Labs, Weight trends, Diet advancement  REASON FOR ASSESSMENT:  Consult Assessment of nutrition requirement/status  ASSESSMENT:  Pt with PMH of T2DM, CKD3, bipolar 1, schizoaffective disorder, substance use, and HTN presented to the ED after being found down at his home  Initially pt NPO due to DKA. However pt's mental status remains altered. Not interacting or responding to staff.  Sister at bedside at time of visit. States normally appetite is good, but that she does feel he has lost weight recently. Some muscle and fat deficits present on exam. 8.2% weight loss noted over the last 6 months.  SLP worked with pt and was not able to advance diet. Pt has been without nutrition x4 days this admission, recommend placement of cortrak tube and initiation of enteral feeds.  Nutritionally Relevant Medications: Scheduled Meds:  folic acid  1 mg Intravenous Daily   insulin aspart  0-9 Units Subcutaneous Q4H   thiamine (VITAMIN B1)  100 mg Intravenous Daily   Continuous Infusions:  dextrose 125 mL/hr at 12/05/21 1214   Labs Reviewed: Na 146, chloride 112 BUN 41, creatinine 2.02 CBG ranges from 189-260  mg/dL over the last 24 hours HgbA1c 8.0% (8/14)  NUTRITION - FOCUSED PHYSICAL EXAM:  Flowsheet Row Most Recent Value  Orbital Region Mild depletion  Upper Arm Region Mild depletion  Thoracic and Lumbar Region Mild depletion  Buccal Region Mild depletion  Temple Region Moderate depletion  Clavicle Bone Region Mild depletion  Clavicle and Acromion Bone Region Mild depletion  Scapular Bone Region Mild depletion  Dorsal Hand No depletion  Patellar Region No depletion  Anterior Thigh Region No depletion  Posterior Calf Region Mild depletion  Edema (RD Assessment) None  Hair Reviewed  Eyes Reviewed  Mouth Reviewed  Skin Reviewed  Nails Reviewed   Diet Order:   Diet Order             Diet NPO time specified  Diet effective now                   EDUCATION NEEDS:  Not appropriate for education at this time  Skin:  Skin Assessment: Skin Integrity Issues: Skin Integrity Issues:: Stage II Stage II: right hip, right elbow, right sacrum  Last BM:  8/17 - type 5  Height:  Ht Readings from Last 1 Encounters:  12/02/21 '6\' 3"'$  (1.905 m)    Weight:  Wt Readings from Last 1 Encounters:  12/02/21 91.9 kg    Ideal Body Weight:  89.1 kg  BMI:  Body mass index is 25.32 kg/m.  Estimated Nutritional Needs:  Kcal:  2400-2600 kcal/d Protein:  120-140g/d Fluid:  2.4-2.6L/d    RachRanell Patrick, LDN Clinical Dietitian RD pager # available in [facility] hours/weekend pager #  available in [facility]

## 2021-12-05 NOTE — Evaluation (Signed)
Clinical/Bedside Swallow Evaluation Patient Details  Name: [name]. MRN: [id number] Date of Birth: 1965/12/19  Today's Date: 12/05/2021 Time: SLP Start Time (ACUTE ONLY): 37 SLP Stop Time (ACUTE ONLY): 7782 SLP Time Calculation (min) (ACUTE ONLY): 20 min  Past Medical History:  Past Medical History:  Diagnosis Date   ADD (attention deficit disorder)    Anxiety    Arthritis    right hip   Bipolar 1 disorder (HCC)    Blood in urine    CKD (chronic kidney disease), stage III (HCC)    Creatinine elevation    Dementia ([place])    "early onset" (08/04/2017)   Depression    bipolar [facility]   Diabetes mellitus without complication ([place])    Family history of anesthesia complication    pt is unsure , but pt father may have been difficult to arouse    HCAP (healthcare-associated pneumonia) 10/31/2012   History of kidney stones    Hypertension    Hypogonadism male    Liver fatty degeneration    Microscopic hematuria    hereditary s/p Urology eval   Neuromuscular disorder ([place])    feet neuropathy    Osteoarthritis of right hip 11/28/2011   2012 2015 s/p THR Severe  Dr Novella Olive     Pleural effusion 11/02/2012   Pneumonia 10-2012   Pneumonia, organism unspecified([place]) 11/02/2012   Polysubstance dependence, non-opioid, in remission ([place])    remote   Primary osteoarthritis of left hip 05/22/2015   PTSD (post-traumatic stress disorder)    SOCIAL ANXIETY DISORDER    Schizoaffective disorder ([place])    Substance abuse ([place])    Suicide attempt by multiple drug overdose ([place]) 01-11-16   Grieving his cat's death July 19, 2015   Past Surgical History:  Past Surgical History:  Procedure Laterality Date   BACK SURGERY     CLOSED REDUCTION METACARPAL WITH PERCUTANEOUS PINNING Right    LUMBAR [place]     TOTAL HIP ARTHROPLASTY Right 08/16/2013   Procedure: TOTAL HIP ARTHROPLASTY ANTERIOR APPROACH;  Surgeon: Hessie Dibble, MD;  Location: [place];  Service:  Orthopedics;  Laterality: Right;   TOTAL HIP ARTHROPLASTY Left 05/22/2015   Procedure: TOTAL HIP ARTHROPLASTY ANTERIOR APPROACH;  Surgeon: Melrose Nakayama, MD;  Location: [place];  Service: Orthopedics;  Laterality: Left;   HPI:  Pt is a 56 year old male who presented 8/13 after he was found down at home. EEg 8/16: suggestive of moderate to severe diffuse encephalopathy, most likely secondary to toxic-metabolic causes. Psychotropic withdrawal suspected. CT head 8/16 and MRI brain 8/13 negative for acute changes. PMH: bipolar 1, PTSD, schizoaffective disorder, substance abuse, polypharmacy, DM2, CKD, hip osteoarthritis.    Assessment / Plan / Recommendation  Clinical Impression  Pt was seen for bedside swallow evaluation with his sister present who denied the pt having a history of dysphagia. Oral mechanism exam was limited due to pt's difficulty following commands. Pt presented with reduced dentition and his maxillary dentures were at home per family. Oral mucosa was dry with dried secretions; this was improved with oral care. Pt exhibited symptoms of oropharyngeal dysphagia characterized by reduced bolus awareness, oral holding, and signs of aspiration with ice chips, puree, thin liquids, and nectar thick liquids. Pt's cough was weak and congested; it's effectiveness is questioned. It is recommended that pt's NPO status be maintained. SLP will follow to assess improvement in swallow function. SLP Visit Diagnosis: Dysphagia, unspecified (R13.10)    Aspiration Risk  Moderate aspiration  risk    Diet Recommendation NPO;Alternative means - temporary   Medication Administration: Via alternative means    Other  Recommendations Oral Care Recommendations: Oral care QID;Staff/trained caregiver to provide oral care    Recommendations for follow up therapy are one component of a multi-disciplinary discharge planning process, led by the attending physician.  Recommendations may be updated based on patient  status, additional functional criteria and insurance authorization.  Follow up Recommendations  (TBD)      Assistance Recommended at Discharge Frequent or constant Supervision/Assistance  Functional Status Assessment Patient has had a recent decline in their functional status and demonstrates the ability to make significant improvements in function in a reasonable and predictable amount of time.  Frequency and Duration min 2x/week  2 weeks       Prognosis Prognosis for Safe Diet Advancement: Good Barriers to Reach Goals: Cognitive deficits      Swallow Study   General Date of Onset: 12/02/21 HPI: Pt is a 56 year old male who presented 8/13 after he was found down at home. EEg 8/16: suggestive of moderate to severe diffuse encephalopathy, most likely secondary to toxic-metabolic causes. Psychotropic withdrawal suspected. CT head 8/16 and MRI brain 8/13 negative for acute changes. PMH: bipolar 1, PTSD, schizoaffective disorder, substance abuse, polypharmacy, DM2, CKD, hip osteoarthritis. Type of Study: Bedside Swallow Evaluation Previous Swallow Assessment: none Diet Prior to this Study: NPO Temperature Spikes Noted: No Respiratory Status: Room air History of Recent Intubation: No Behavior/Cognition: Pleasant mood;Confused;Alert;Doesn't follow directions;Requires cueing Oral Cavity Assessment: Within Functional Limits Oral Care Completed by SLP: No Self-Feeding Abilities: Total assist Patient Positioning: Upright in bed;Postural control adequate for testing Baseline Vocal Quality: Wet Volitional Cough: Congested;Weak Volitional Swallow: Able to elicit    Oral/Motor/Sensory Function Overall Oral Motor/Sensory Function:  (difficult to assess)   Ice Chips Ice chips: Impaired Presentation: Spoon Oral Phase Impairments: Poor awareness of bolus Oral Phase Functional Implications: Oral holding Pharyngeal Phase Impairments: Cough - Immediate   Thin Liquid Thin Liquid:  Impaired Presentation: Straw Pharyngeal  Phase Impairments: Cough - Immediate    Nectar Thick Nectar Thick Liquid: Impaired Presentation: Spoon Oral Phase Impairments: Poor awareness of bolus Oral phase functional implications: Oral holding Pharyngeal Phase Impairments: Cough - Immediate;Throat Clearing - Immediate   Honey Thick Honey Thick Liquid: Not tested   Puree Puree: Impaired Presentation: Spoon Oral Phase Impairments: Poor awareness of bolus Oral Phase Functional Implications: Oral holding Pharyngeal Phase Impairments: Cough - Immediate;Throat Clearing - Immediate   Solid     Solid: Not tested     [name], [place], [place] Office number [place] 12/05/2021,3:52 PM

## 2021-12-05 NOTE — Progress Notes (Signed)
SUBJECTIVE  Chief complaint Altered mental status  Summary [name]. is a 56 y.o. male with PMHx of PTSD, bipolar disorder, schizoaffective disorder, questionable seizures, medication-induced tremors, substance abuse, polypharmacy, T2DM, and HTN who presented to the ED on 12/01/2021 with altered mental status. He was last known to be at his baseline on 11/29/2021.   Overnight events NAEON, VSS. Fevers have improved with Tmax O/N of 99.9 F. HR in 110s, BP in 160s/100s. 1.3L UOP in past 24 hrs.   Today's interview Pt does not engage with interview. Family was not present during the interview.   Review of systems ROS is unattainable due to pt's altered mental status.   OBJECTIVE  Vitals BP (!) 150/100   Pulse (!) 103   Temp (!) 100.7 F (38.2 C) (Axillary)   Resp (!) 27   Ht '6\' 3"'$  (1.905 m)   Wt 91.9 kg   SpO2 96%   BMI 25.32 kg/m   Physical exam Constitutional: Awake but not oriented. More alert and expressive today. Appears disheveled and malnourished. Eyes: PERRL. Sclera anicteric.  HENT: Normocephalic and atraumatic. Gross mucous secretions present.  Cardiovascular: Tachycardic. Regular rhythm. No M/R/G.  Respiratory: Scattered rhonchi. No crackles or wheezes. Tachypneic with belly breathing, not using accessory muscles.  Gastrointestinal: Soft, NT/ND.  Musculoskeletal: Extremeties are warm. No clubbing, cyanosis, or edema. +2 pulses in BUE and BLE.  Skin: BLE with multiple excoriations. Scattered excoriations and nonblanching petechiae of BUE, stable from prior.  Neurologic/ psychiatric: A&O x0. Moves all four extremeties spontaneously. Generally tremulous and resists extension of BUE with rigidity, but improved from prior. Moves head and tracks eyes to voice. Seems to attempt mouthing words in response to interview.   Labs, studies, and imaging Labs, studies, and imaging from the last 24 hours per EMR and personally reviewed.  Results for orders placed or  performed during the hospital encounter of 12/01/21 (from the past 24 hour(s))  Glucose, capillary     Status: Abnormal   Collection Time: 12/04/21  3:14 PM  Result Value Ref Range   Glucose-Capillary 236 (H) 70 - 99 mg/dL   Comment 1 Notify RN    Comment 2 Document in Chart   MRSA Next Gen by PCR, Nasal     Status: None   Collection Time: 12/04/21  3:43 PM   Specimen: Nasal Mucosa; Nasal Swab  Result Value Ref Range   MRSA by PCR Next Gen NOT DETECTED NOT DETECTED  Glucose, capillary     Status: Abnormal   Collection Time: 12/04/21  7:36 PM  Result Value Ref Range   Glucose-Capillary 245 (H) 70 - 99 mg/dL  Glucose, capillary     Status: Abnormal   Collection Time: 12/04/21 11:41 PM  Result Value Ref Range   Glucose-Capillary 220 (H) 70 - 99 mg/dL  CBC     Status: Abnormal   Collection Time: 12/05/21  2:38 AM  Result Value Ref Range   WBC 7.2 4.0 - 10.5 K/uL   RBC 4.51 4.22 - 5.81 MIL/uL   Hemoglobin 13.0 13.0 - 17.0 g/dL   HCT 38.2 (L) 39.0 - 52.0 %   MCV 84.7 80.0 - 100.0 fL   MCH 28.8 26.0 - 34.0 pg   MCHC 34.0 30.0 - 36.0 g/dL   RDW 12.4 11.5 - 15.5 %   Platelets 141 (L) 150 - 400 K/uL   nRBC 0.3 (H) 0.0 - 0.2 %  Basic metabolic panel     Status: Abnormal  Collection Time: 12/05/21  2:38 AM  Result Value Ref Range   Sodium 146 (H) 135 - 145 mmol/L   Potassium 3.7 3.5 - 5.1 mmol/L   Chloride 112 (H) 98 - 111 mmol/L   CO2 26 22 - 32 mmol/L   Glucose, Bld 234 (H) 70 - 99 mg/dL   BUN 41 (H) 6 - 20 mg/dL   Creatinine, Ser 2.02 (H) 0.61 - 1.24 mg/dL   Calcium 9.4 8.9 - 10.3 mg/dL   GFR, Estimated 39 (L) >60 mL/min   Anion gap 8 5 - 15  Glucose, capillary     Status: Abnormal   Collection Time: 12/05/21  3:53 AM  Result Value Ref Range   Glucose-Capillary 260 (H) 70 - 99 mg/dL  Glucose, capillary     Status: Abnormal   Collection Time: 12/05/21  8:39 AM  Result Value Ref Range   Glucose-Capillary 252 (H) 70 - 99 mg/dL  Glucose, capillary     Status: Abnormal    Collection Time: 12/05/21 11:54 AM  Result Value Ref Range   Glucose-Capillary 189 (H) 70 - 99 mg/dL   Comment 1 Notify RN    ASSESSMENT & PLAN  [name]. is a 56 y.o. male with PMHx of PTSD, bipolar disorder, schizoaffective disorder, questionable seizures, medication-induced tremors, substance abuse, dementia vs pseudodementia in the setting of his psychiatric history and polypharmacy, T2DM, HTN, and peripheral neuropathy who is admitted for workup and management of acute encephalopathy.   Hospital day: 4  Principal Problem:   Encephalopathy acute Active Problems:   Schizoaffective disorder, bipolar type ([place])   Diabetic ketoacidosis without coma associated with type 2 diabetes mellitus (HCC)   Thyroid nodule   Hypernatremia   Stage 2 skin ulcer of sacral region ([place])  Acute encephalopathy, likely toxic-metabolic Suspected psychotropic withdrawal Although he remains encephalopathic and nonverbal to [place], pt has appeared more alert, expressive, and interactive during interview and exam today. His fever has also improved. His clinical improvement on scheduled ativan supports withdrawal from his psychotropic medications as the underlying etiology of his presenting symptoms. Of note, he had been started on abx yesterday but there remains low suspicion for infection - WBC is normal and new BCx drawn yesterday have NGTD - so we will d/c abx at this time and continue to monitor. - Continue IV ativan 2 mg q6hr - Continue mIVF - Plan for psych re-consult tomorrow  - Consider gradually reinstating psychotropic medications - Stop IV vancomycin  - PRN tylenol and ice packs for fever - Ensure oral care and secretion clearance  Hyperglycemia H/o T2DM CBGs remain elevated in the 200's. We will increase his semglee again today and monitor.  - Increase semglee to 25u daily - Continue novolog 0-9u q4hr (sensitive correction) - Monitor CBGs  Hypernatremia With his hyperglycemia, Na+  today corrected again to 150. Likely in s/o NPO status and fluid losses from his encephalopathic illness.  - Continue mIVF (D5W @ 125 mL/hr) - Trend BMP  AKI on CKD stage 4 Cr continues to downtrend and was 2.02 today. Baseline Cr ~2. Will continue fluids and monitor.  - mIVF - Trend BMP  Checklist Diet: NPO IVF: D5W @ 125 mL/hr DVT PPx: Lovenox Code status: DNR  ------------------------  Lunette Stands, MS4 12/05/2021 Pager: [phone]

## 2021-12-05 NOTE — Progress Notes (Signed)
Pt was NT suctioned on right nare after CPT (vest). Moderate white thin secretions collected. Pt breathing more comfortably and is stable at this time.

## 2021-12-05 NOTE — Care Management Important Message (Signed)
Important Message  Patient Details  Name: [name]. MRN: [id number] Date of Birth: Mar 03, 1966   Medicare Important Message Given:  Yes     Tahni Porchia 12/05/2021, 3:12 PM

## 2021-12-06 ENCOUNTER — Inpatient Hospital Stay (HOSPITAL_COMMUNITY): Payer: Medicare Other

## 2021-12-06 DIAGNOSIS — E1165 Type 2 diabetes mellitus with hyperglycemia: Secondary | ICD-10-CM | POA: Diagnosis not present

## 2021-12-06 DIAGNOSIS — N184 Chronic kidney disease, stage 4 (severe): Secondary | ICD-10-CM | POA: Diagnosis not present

## 2021-12-06 DIAGNOSIS — G9349 Other encephalopathy: Secondary | ICD-10-CM | POA: Diagnosis not present

## 2021-12-06 DIAGNOSIS — E440 Moderate protein-calorie malnutrition: Secondary | ICD-10-CM | POA: Insufficient documentation

## 2021-12-06 DIAGNOSIS — N179 Acute kidney failure, unspecified: Secondary | ICD-10-CM | POA: Diagnosis not present

## 2021-12-06 LAB — CBC
HCT: 37.3 % — ABNORMAL LOW (ref 39.0–52.0)
Hemoglobin: 12.9 g/dL — ABNORMAL LOW (ref 13.0–17.0)
MCH: 29 pg (ref 26.0–34.0)
MCHC: 34.6 g/dL (ref 30.0–36.0)
MCV: 83.8 fL (ref 80.0–100.0)
Platelets: 127 10*3/uL — ABNORMAL LOW (ref 150–400)
RBC: 4.45 MIL/uL (ref 4.22–5.81)
RDW: 12 % (ref 11.5–15.5)
WBC: 7.5 10*3/uL (ref 4.0–10.5)
nRBC: 0.3 % — ABNORMAL HIGH (ref 0.0–0.2)

## 2021-12-06 LAB — CULTURE, BLOOD (ROUTINE X 2): Culture: NO GROWTH

## 2021-12-06 LAB — MAGNESIUM: Magnesium: 2 mg/dL (ref 1.7–2.4)

## 2021-12-06 LAB — GLUCOSE, CAPILLARY
Glucose-Capillary: 145 mg/dL — ABNORMAL HIGH (ref 70–99)
Glucose-Capillary: 152 mg/dL — ABNORMAL HIGH (ref 70–99)
Glucose-Capillary: 159 mg/dL — ABNORMAL HIGH (ref 70–99)
Glucose-Capillary: 162 mg/dL — ABNORMAL HIGH (ref 70–99)
Glucose-Capillary: 217 mg/dL — ABNORMAL HIGH (ref 70–99)
Glucose-Capillary: 229 mg/dL — ABNORMAL HIGH (ref 70–99)

## 2021-12-06 LAB — BASIC METABOLIC PANEL
Anion gap: 8 (ref 5–15)
BUN: 35 mg/dL — ABNORMAL HIGH (ref 6–20)
CO2: 22 mmol/L (ref 22–32)
Calcium: 9 mg/dL (ref 8.9–10.3)
Chloride: 109 mmol/L (ref 98–111)
Creatinine, Ser: 1.77 mg/dL — ABNORMAL HIGH (ref 0.61–1.24)
GFR, Estimated: 45 mL/min — ABNORMAL LOW (ref >60)
Glucose, Bld: 230 mg/dL — ABNORMAL HIGH (ref 70–99)
Potassium: 3.6 mmol/L (ref 3.5–5.1)
Sodium: 139 mmol/L (ref 135–145)

## 2021-12-06 LAB — PHOSPHORUS: Phosphorus: 3 mg/dL (ref 2.5–4.6)

## 2021-12-06 MED ORDER — GLUCERNA 1.5 CAL PO LIQD
1000.0000 mL | ORAL | Status: DC
  Administered 2021-12-06 – 2021-12-16 (×8): 1000 mL
  Filled 2021-12-06 (×22): qty 1000

## 2021-12-06 MED ORDER — LACTATED RINGERS IV SOLN: INTRAVENOUS | Status: DC

## 2021-12-06 MED ORDER — FREE WATER
125.0000 mL | Status: DC
Start: 2021-12-06 — End: 2021-12-10
  Administered 2021-12-06 – 2021-12-10 (×23): 125 mL

## 2021-12-06 NOTE — Progress Notes (Signed)
Speech Language Pathology Treatment: Dysphagia  Patient Details Name: [name]. MRN: [id number] DOB: 1965-07-18 Today's Date: 12/06/2021 Time: 8295-6213 SLP Time Calculation (min) (ACUTE ONLY): 18 min  Assessment / Plan / Recommendation Clinical Impression  Pt drowsy, but adequately alert for administration of PO trials this am. He verbalized a few short phrases, interacting with clinician throughout session. Oral care provided prior POs, with pt coughing up thick secretions for clinician to suction at posterior oral cavity. With ice chips, sips of thin by cup and small bites of puree, pt exhibited poor awareness to bolus presentation (even when spoon/cup touched lips), inconsistent oral manipulation and intermittent coughing. Palpable swallow was consistent, but likely delayed. Some anterior loss of ice chips/thin liquids present with diffuse min oral residuals exhibited with pureed trials. Pt without awareness of oral residuals despite cueing. Suctioned oral cavity prior to SLP departure. Recommend continue NPO with temporary alternative means of nutrition. Suspect as mentation improves, swallow function will as well. SLP to f/u for PO readiness.   HPI HPI: Pt is a 56 year old male who presented 8/13 after he was found down at home. EEg 8/16: suggestive of moderate to severe diffuse encephalopathy, most likely secondary to toxic-metabolic causes. Psychotropic withdrawal suspected. CT head 8/16 and MRI brain 8/13 negative for acute changes. PMH: bipolar 1, PTSD, schizoaffective disorder, substance abuse, polypharmacy, DM2, CKD, hip osteoarthritis.      SLP Plan  Continue with current plan of care      Recommendations for follow up therapy are one component of a multi-disciplinary discharge planning process, led by the attending physician.  Recommendations may be updated based on patient status, additional functional criteria and insurance authorization.    Recommendations  Diet  recommendations: NPO Medication Administration: Via alternative means                Oral Care Recommendations: Oral care QID;Staff/trained caregiver to provide oral care Follow Up Recommendations: Other (comment) (TBD) Assistance recommended at discharge: Frequent or constant Supervision/Assistance SLP Visit Diagnosis: Dysphagia, unspecified (R13.10) Plan: Continue with current plan of care            Ellwood Dense, [place], [place] Office Number: [place]  12/06/2021, 10:23 AM

## 2021-12-06 NOTE — Progress Notes (Signed)
SUBJECTIVE  Chief complaint Altered mental status  Summary [name]. is a 56 y.o. male with PMHx of PTSD, bipolar disorder, schizoaffective disorder, questionable seizures, medication-induced tremors, substance abuse, polypharmacy, T2DM, and HTN who presented to the ED on 12/01/2021 with altered mental status. He was last known to be at his baseline on 11/29/2021.   Overnight events NAEON, VSS. Pt remained afebrile O/N.   Today's interview Patient said "no" when asked if he had any pain or other complaints. He was interactive and provided other one-word responses during the interview. Family was not present at the time.  Review of systems Other than noted above, ROS is unattainable due to pt's altered mental status.   OBJECTIVE  Vitals BP (!) 153/93 (BP Location: Left Arm)   Pulse 94   Temp 98 F (36.7 C) (Oral)   Resp 17   Ht '6\' 3"'$  (1.905 m)   Wt 91.9 kg   SpO2 95%   BMI 25.32 kg/m   Physical exam Constitutional: Awake but not oriented. More interactive with exam and interview today. Intermittently vocalized one-word responses to questioning.  Eyes: Pupils dilated to 5 mm bilaterally, equal, reactive to light. Sclera anicteric.  HENT: Normocephalic and atraumatic. Gross mucous secretions present. Cardiovascular: Tachycardic. Regular rhythm. No M/R/G.  Respiratory: Scattered rhonchi. No crackles or wheezes. Tachypneic with belly breathing, not using accessory muscles.  Gastrointestinal: Soft, NT/ND.  Musculoskeletal: Extremeties are warm. No clubbing, cyanosis, or edema. +2 pulses in BUE and BLE.  Skin: BLE with multiple excoriations. Scattered excoriations and nonblanching petechiae of BUE, stable from prior. Stage II pressure wounds of the R elbow, R hip, and R sacrum.  Neurologic/ psychiatric: More alert compared to prior. Oriented only to building (hospital). Moves all four extremeties spontaneously. BUE are less rigid and tremulous. Able to vocalize words.  Following some commands intermittently.    Labs, studies, and imaging Labs, studies, and imaging from the last 24 hours per EMR and personally reviewed.  Results for orders placed or performed during the hospital encounter of 12/01/21 (from the past 24 hour(s))  Glucose, capillary     Status: Abnormal   Collection Time: 12/05/21  4:17 PM  Result Value Ref Range   Glucose-Capillary 232 (H) 70 - 99 mg/dL   Comment 1 Notify RN   Glucose, capillary     Status: Abnormal   Collection Time: 12/05/21  7:44 PM  Result Value Ref Range   Glucose-Capillary 240 (H) 70 - 99 mg/dL  Glucose, capillary     Status: Abnormal   Collection Time: 12/05/21 11:30 PM  Result Value Ref Range   Glucose-Capillary 232 (H) 70 - 99 mg/dL  CBC     Status: Abnormal   Collection Time: 12/06/21  3:05 AM  Result Value Ref Range   WBC 7.5 4.0 - 10.5 K/uL   RBC 4.45 4.22 - 5.81 MIL/uL   Hemoglobin 12.9 (L) 13.0 - 17.0 g/dL   HCT 37.3 (L) 39.0 - 52.0 %   MCV 83.8 80.0 - 100.0 fL   MCH 29.0 26.0 - 34.0 pg   MCHC 34.6 30.0 - 36.0 g/dL   RDW 12.0 11.5 - 15.5 %   Platelets 127 (L) 150 - 400 K/uL   nRBC 0.3 (H) 0.0 - 0.2 %  Basic metabolic panel     Status: Abnormal   Collection Time: 12/06/21  3:05 AM  Result Value Ref Range   Sodium 139 135 - 145 mmol/L   Potassium 3.6 3.5 - 5.1  mmol/L   Chloride 109 98 - 111 mmol/L   CO2 22 22 - 32 mmol/L   Glucose, Bld 230 (H) 70 - 99 mg/dL   BUN 35 (H) 6 - 20 mg/dL   Creatinine, Ser 1.77 (H) 0.61 - 1.24 mg/dL   Calcium 9.0 8.9 - 10.3 mg/dL   GFR, Estimated 45 (L) >60 mL/min   Anion gap 8 5 - 15  Glucose, capillary     Status: Abnormal   Collection Time: 12/06/21  3:59 AM  Result Value Ref Range   Glucose-Capillary 229 (H) 70 - 99 mg/dL  Glucose, capillary     Status: Abnormal   Collection Time: 12/06/21  7:51 AM  Result Value Ref Range   Glucose-Capillary 217 (H) 70 - 99 mg/dL   Comment 1 Notify RN   Glucose, capillary     Status: Abnormal   Collection Time: 12/06/21  11:57 AM  Result Value Ref Range   Glucose-Capillary 162 (H) 70 - 99 mg/dL   Comment 1 Notify RN     ASSESSMENT & PLAN  [name]. is a 56 y.o. male with PMHx of PTSD, bipolar disorder, schizoaffective disorder, questionable seizures, medication-induced tremors, substance abuse, dementia vs pseudodementia in the setting of his psychiatric history and polypharmacy, T2DM, HTN, and peripheral neuropathy who is admitted for workup and management of acute encephalopathy.   Hospital day: 5  Principal Problem:   Encephalopathy acute Active Problems:   Schizoaffective disorder, bipolar type ([place])   Diabetic ketoacidosis without coma associated with type 2 diabetes mellitus (HCC)   Thyroid nodule   Hypernatremia   Stage 2 skin ulcer of sacral region [facility])  Acute encephalopathy, likely toxic-metabolic Suspected psychotropic withdrawal Patient's encephalopathy continues to improve today. He is now able to follow some commands and vocalize one-word responses on interview. He was seen by SLP and nutrition and per their evaluation, he is still unable to tolerate PO intake so we will initiate tube feeds as agreed upon by his sister (his POA). He has no fevers today. BCx no growth at 2 days.  - Continue IV ativan 2 mg q6hr - Continue mIVF - Psych re-consult - Consider gradually reinstating psychotropic medications - SLP and nutrition consulted, appreciate recs - Keep NPO at this time - Cortrak for feeding tube  - PRN tylenol and ice packs for fever - Ensure oral care and secretion clearance   Hyperglycemia H/o T2DM CBGs remain elevated in the 200's, likely due to pt being on D5W for the past few days. Switching mIVF to LR today. Will continue to monitor CBG as patient transitions from NPO to tube feeds.  - Continue semglee to 25u daily - Continue novolog 0-9u q4hr (sensitive correction) - Monitor CBGs   AKI on CKD stage 4 Cr continues to downtrend and was 1.77 today. Baseline Cr  ~2. Will continue fluids and monitor.  - mIVF - Trend BMP  Hypernatremia - resolved Na+ has improved today to 139, corrected to 142 per pt's hyperglycemia. Will switch from D5W to LR as noted above. - Trend BMP   Checklist Diet: NPO IVF: mIVF (LR @ 125 mL/hr) DVT PPx: Lovenox Wound care: Stage II pressure wounds of the R elbow, R hip, and R sacrum - stable Code status: DNR  ------------------------  Lunette Stands, MS4 12/06/2021

## 2021-12-06 NOTE — Progress Notes (Signed)
Brief Nutrition Note  Cortrak placed 8/18 as pt remains NPO at SLP recommendation for decreased alertness. Confirmed gastric with XR.  Entered the following TF regimen: Glucerna 1.5 @ 28m/h (16820md) Start at 20, advance by 10 q8h to goal.  Free water flush 12574m4h This provides 2340kcal, 129g protein, and 2025m50mter (flush +TF) Pt at risk for refeeding. Monitor labs x 48 hours and replace as needed  RachRanell Patrick, LDN Clinical Dietitian RD pager # available in [facility] hours/weekend pager # available in [facility]

## 2021-12-06 NOTE — Progress Notes (Signed)
SLP Cancellation Note  Patient Details Name: [name]. MRN: [id number] DOB: 1965/05/13   Cancelled treatment:       Reason Eval/Treat Not Completed: Patient at procedure or test/unavailable  Unable to provide PO trials at this time, as pt is unavailable - with nursing for pt care. Will continue efforts.   Kingstin Heims B. Quentin Ore, [facility], [place] Speech Language Pathologist Office: [phone]  Shonna Chock 12/06/2021, 8:42 AM

## 2021-12-06 NOTE — Procedures (Signed)
Cortrak  Tube Type:  Cortrak - 43 inches Tube Location:  Left nare Initial Placement:  Stomach Secured by: Bridle Technique Used to Measure Tube Placement:  Marking at nare/corner of mouth Cortrak Secured At:  77 cm   Cortrak Tube Team Note:  Consult received to place a Cortrak feeding tube.   X-ray is required, abdominal x-ray has been ordered by the Cortrak team. Please confirm tube placement before using the Cortrak tube.   If the tube becomes dislodged please keep the tube and contact the Cortrak team at [email or web address] (password [email or web address]) for replacement.  If after hours and replacement cannot be delayed, place a NG tube and confirm placement with an abdominal x-ray.    Koleen Distance MS, RD, LDN Please refer to [facility] for RD and/or RD on-call/weekend/after hours pager

## 2021-12-07 DIAGNOSIS — L98429 Non-pressure chronic ulcer of back with unspecified severity: Secondary | ICD-10-CM | POA: Diagnosis not present

## 2021-12-07 DIAGNOSIS — T50995A Adverse effect of other drugs, medicaments and biological substances, initial encounter: Secondary | ICD-10-CM

## 2021-12-07 DIAGNOSIS — E440 Moderate protein-calorie malnutrition: Secondary | ICD-10-CM

## 2021-12-07 DIAGNOSIS — E1165 Type 2 diabetes mellitus with hyperglycemia: Secondary | ICD-10-CM | POA: Diagnosis not present

## 2021-12-07 DIAGNOSIS — Z931 Gastrostomy status: Secondary | ICD-10-CM

## 2021-12-07 DIAGNOSIS — G928 Other toxic encephalopathy: Secondary | ICD-10-CM

## 2021-12-07 LAB — GLUCOSE, CAPILLARY
Glucose-Capillary: 140 mg/dL — ABNORMAL HIGH (ref 70–99)
Glucose-Capillary: 165 mg/dL — ABNORMAL HIGH (ref 70–99)
Glucose-Capillary: 182 mg/dL — ABNORMAL HIGH (ref 70–99)
Glucose-Capillary: 167 mg/dL — ABNORMAL HIGH (ref 70–99)
Glucose-Capillary: 191 mg/dL — ABNORMAL HIGH (ref 70–99)
Glucose-Capillary: 207 mg/dL — ABNORMAL HIGH (ref 70–99)

## 2021-12-07 LAB — MAGNESIUM
Magnesium: 1.9 mg/dL (ref 1.7–2.4)
Magnesium: 2 mg/dL (ref 1.7–2.4)

## 2021-12-07 LAB — CBC
HCT: 35.8 % — ABNORMAL LOW (ref 39.0–52.0)
Hemoglobin: 13 g/dL (ref 13.0–17.0)
MCH: 29.2 pg (ref 26.0–34.0)
MCHC: 36.3 g/dL — ABNORMAL HIGH (ref 30.0–36.0)
MCV: 80.4 fL (ref 80.0–100.0)
Platelets: 154 10*3/uL (ref 150–400)
RBC: 4.45 MIL/uL (ref 4.22–5.81)
RDW: 12 % (ref 11.5–15.5)
WBC: 8.7 10*3/uL (ref 4.0–10.5)
nRBC: 0.2 % (ref 0.0–0.2)

## 2021-12-07 LAB — PHOSPHORUS
Phosphorus: 3 mg/dL (ref 2.5–4.6)
Phosphorus: 2.9 mg/dL (ref 2.5–4.6)

## 2021-12-07 LAB — BASIC METABOLIC PANEL
Anion gap: 9 (ref 5–15)
BUN: 36 mg/dL — ABNORMAL HIGH (ref 6–20)
CO2: 22 mmol/L (ref 22–32)
Calcium: 9.3 mg/dL (ref 8.9–10.3)
Chloride: 112 mmol/L — ABNORMAL HIGH (ref 98–111)
Creatinine, Ser: 1.59 mg/dL — ABNORMAL HIGH (ref 0.61–1.24)
GFR, Estimated: 52 mL/min — ABNORMAL LOW (ref >60)
Glucose, Bld: 153 mg/dL — ABNORMAL HIGH (ref 70–99)
Potassium: 3.5 mmol/L (ref 3.5–5.1)
Sodium: 143 mmol/L (ref 135–145)

## 2021-12-07 MED ORDER — PANTOPRAZOLE SODIUM 40 MG IV SOLR
40.0000 mg | INTRAVENOUS | Status: DC
  Administered 2021-12-07 – 2021-12-11 (×5): 40 mg via INTRAVENOUS
  Filled 2021-12-07 (×5): qty 10

## 2021-12-07 MED ORDER — LORAZEPAM 2 MG/ML IJ SOLN
2.0000 mg | Freq: Three times a day (TID) | INTRAMUSCULAR | Status: DC
Start: 2021-12-07 — End: 2021-12-08
  Administered 2021-12-07 – 2021-12-08 (×3): 2 mg via INTRAVENOUS
  Filled 2021-12-07 (×3): qty 1

## 2021-12-07 NOTE — Progress Notes (Signed)
HD#6 Subjective:   Summary: Jonathan Duva. is a 56 y.o. male with PMHx of PTSD, bipolar disorder, schizoaffective disorder, medication-induced tremors, polypharmacy, T2DM, and HTN who presented to the ED on 12/01/2021 with altered mental status secondary to polypharmacy  Overnight Events: no acute events overnight   Patient resting in bed, verbal this morning. Requesting sister come to bedside. Denies being in any pain. We oriented him to situation and where he is. Spoke with his sister [name] who saw dramatic improvement in him yesterday, patient was asking her what happened and where he was.   Objective:  Vital signs in last 24 hours: Vitals:   12/06/21 2019 12/06/21 2345 12/07/21 0345 12/07/21 0734  BP:  (!) 143/96 (!) 152/91 (!) 136/95  Pulse:    100  Resp:  '20 20 17  '$ Temp:  98.5 F (36.9 C) 98.9 F (37.2 C) 98.7 F (37.1 C)  TempSrc:  Axillary Axillary Axillary  SpO2: 93% 96% 93% 92%  Weight:      Height:       Supplemental O2: Room Air  Physical Exam:  Constitutional: ill appearing HENT: normocephalic atraumatic, NG tube in placed. Bilateral mydriasis  Eyes: conjunctiva non-erythematous Neck: supple Cardiovascular: regular rate and rhythm, no m/r/g Pulmonary/Chest: normal work of breathing on room air, rhonchi clear with cough Abdominal: soft, non-tender, non-distended MSK: normal bulk and tone Neurological: alert and verbal. Tracks appropriately and able to follow some commands. Limited due to encephalopathy Skin: warm and dry. Well healing bilateral lower extremity excortations  Filed Weights   12/02/21 0049  Weight: 91.9 kg     Intake/Output Summary (Last 24 hours) at 12/07/2021 1052 Last data filed at 12/07/2021 0500 Gross per 24 hour  Intake 1128.74 ml  Output 550 ml  Net 578.74 ml   Net IO Since Admission: 5,369.71 mL [12/07/21 1052]  Pertinent Labs:    Latest Ref Rng & Units 12/07/2021    4:14 AM 12/06/2021    3:05 AM 12/05/2021    2:38 AM   CBC  WBC 4.0 - 10.5 K/uL 8.7  7.5  7.2   Hemoglobin 13.0 - 17.0 g/dL 13.0  12.9  13.0   Hematocrit 39.0 - 52.0 % 35.8  37.3  38.2   Platelets 150 - 400 K/uL 154  127  141        Latest Ref Rng & Units 12/07/2021    4:14 AM 12/06/2021    3:05 AM 12/05/2021    2:38 AM  CMP  Glucose 70 - 99 mg/dL 153  230  234   BUN 6 - 20 mg/dL 36  35  41   Creatinine 0.61 - 1.24 mg/dL 1.59  1.77  2.02   Sodium 135 - 145 mmol/L 143  139  146   Potassium 3.5 - 5.1 mmol/L 3.5  3.6  3.7   Chloride 98 - 111 mmol/L 112  109  112   CO2 22 - 32 mmol/L '22  22  26   '$ Calcium 8.9 - 10.3 mg/dL 9.3  9.0  9.4     Imaging: DG Abd Portable 1V  Result Date: 12/06/2021 CLINICAL DATA:  Nasogastric tube placement. EXAM: PORTABLE ABDOMEN - 1 VIEW COMPARISON:  None Available. FINDINGS: Enteric tube appears adequately positioned in the stomach with tip directed towards the stomach pylorus/duodenal bulb. Visualized portion of the bowel gas pattern is nonobstructive. IMPRESSION: Enteric tube appears adequately positioned in the stomach with tip directed towards the stomach pylorus/duodenal bulb. Electronically Signed  By: Franki Cabot M.D.   On: 12/06/2021 15:38    Assessment/Plan:   Principal Problem:   Encephalopathy acute Active Problems:   Schizoaffective disorder, bipolar type ([place])   Diabetic ketoacidosis without coma associated with type 2 diabetes mellitus (HCC)   Thyroid nodule   Hypernatremia   Stage 2 skin ulcer of sacral region [facility])   Malnutrition of moderate degree   Patient Summary: Jonathan Olden. is a 56 y.o. with a pertinent PMH of PTSD, bipolar disorder, schizoaffective disorder, medication-induced tremors, polypharmacy, who presented with confusion and admitted for acute encephalopathy and polypharmacy. He has had improvement in his mentation over the past few days with holding his anti-psychotics.   Toxic metabolic encephalopathy Etiology likely polypharmacy with multiple centrally  acting medications and possible withdrawal with sudden cessation of these. He continues to improve, more alert and vocal today, following some commands. He has remained afebrile over 24 hours. Will continue to hold his home centrally acting medications and continue PRN ativan. Will reconsult psychiatry as we begin to re-initiate his home medications in the coming days.  -continue ativan 2 mg, extend out from q6h to q8h.   Enteral nutrition  Malnutrition Hyperglycemia Tolerating cortrak feedings well. Improvement in hyperglycemia with SSI q4h and semglee 25 U daily. Adjust insulin as needed and initiate diet once allowed. Electrolytes are stable, no need for additional fluids at this time. With his severe illness, will start IV PPI for ulcer prevention  -continue cortrak and hyperglycemia management with SSI and semglee 25 U -free water flushes 125 mL q4h -thiamine 100 mg daily  Acute kidney injury on CKD  Unclear baseline, patient's creatinine assessed during hospitalization for acute illness. Has had improvement in Cr and GFR during hospitalization with IV fluids and resolution of his rhabdomyolysis and dehydration.  -daily cmp  Hypertension Elevated in setting of severe illness. Continue to monitor  Pressure injuries Patient with pressure injury to right elbow, right lower extremity and right sacrum. Continue with frequent rolling and management of wounds per nursing staff.   Diet: Tube Feeds IVF: None,None VTE: Enoxaparin Code: DNR PT/OT recs: Pending assessment  Dispo: Anticipated discharge pending further resolution of acute encephalopathy  Sanjuana Letters DO Internal Medicine Resident PGY-3 Please contact the on call pager after 5 pm and on weekends at [phone].

## 2021-12-08 ENCOUNTER — Inpatient Hospital Stay (HOSPITAL_COMMUNITY): Payer: Medicare Other

## 2021-12-08 DIAGNOSIS — T50995A Adverse effect of other drugs, medicaments and biological substances, initial encounter: Secondary | ICD-10-CM | POA: Diagnosis not present

## 2021-12-08 DIAGNOSIS — G928 Other toxic encephalopathy: Secondary | ICD-10-CM | POA: Diagnosis not present

## 2021-12-08 DIAGNOSIS — L98429 Non-pressure chronic ulcer of back with unspecified severity: Secondary | ICD-10-CM | POA: Diagnosis not present

## 2021-12-08 DIAGNOSIS — E1165 Type 2 diabetes mellitus with hyperglycemia: Secondary | ICD-10-CM | POA: Diagnosis not present

## 2021-12-08 LAB — CBC WITH DIFFERENTIAL/PLATELET
Abs Immature Granulocytes: 0.21 10*3/uL — ABNORMAL HIGH (ref 0.00–0.07)
Basophils Absolute: 0.1 10*3/uL (ref 0.0–0.1)
Basophils Relative: 1 %
Eosinophils Absolute: 0.1 10*3/uL (ref 0.0–0.5)
Eosinophils Relative: 1 %
HCT: 39.3 % (ref 39.0–52.0)
Hemoglobin: 13.4 g/dL (ref 13.0–17.0)
Immature Granulocytes: 2 %
Lymphocytes Relative: 17 %
Lymphs Abs: 2 10*3/uL (ref 0.7–4.0)
MCH: 28.5 pg (ref 26.0–34.0)
MCHC: 34.1 g/dL (ref 30.0–36.0)
MCV: 83.4 fL (ref 80.0–100.0)
Monocytes Absolute: 1.3 10*3/uL — ABNORMAL HIGH (ref 0.1–1.0)
Monocytes Relative: 11 %
Neutro Abs: 8.1 10*3/uL — ABNORMAL HIGH (ref 1.7–7.7)
Neutrophils Relative %: 68 %
Platelets: 206 10*3/uL (ref 150–400)
RBC: 4.71 MIL/uL (ref 4.22–5.81)
RDW: 12.6 % (ref 11.5–15.5)
WBC: 11.7 10*3/uL — ABNORMAL HIGH (ref 4.0–10.5)
nRBC: 0.3 % — ABNORMAL HIGH (ref 0.0–0.2)

## 2021-12-08 LAB — GLUCOSE, CAPILLARY
Glucose-Capillary: 224 mg/dL — ABNORMAL HIGH (ref 70–99)
Glucose-Capillary: 271 mg/dL — ABNORMAL HIGH (ref 70–99)
Glucose-Capillary: 269 mg/dL — ABNORMAL HIGH (ref 70–99)
Glucose-Capillary: 267 mg/dL — ABNORMAL HIGH (ref 70–99)
Glucose-Capillary: 246 mg/dL — ABNORMAL HIGH (ref 70–99)
Glucose-Capillary: 231 mg/dL — ABNORMAL HIGH (ref 70–99)

## 2021-12-08 LAB — URINALYSIS, COMPLETE (UACMP) WITH MICROSCOPIC
Bacteria, UA: NONE SEEN
Bilirubin Urine: NEGATIVE
Glucose, UA: 100 mg/dL — AB
Ketones, ur: NEGATIVE mg/dL
Leukocytes,Ua: NEGATIVE
Nitrite: NEGATIVE
Protein, ur: 100 mg/dL — AB
Specific Gravity, Urine: 1.01 (ref 1.005–1.030)
Squamous Epithelial / HPF: NONE SEEN (ref 0–5)
pH: 7.5 (ref 5.0–8.0)

## 2021-12-08 LAB — PHOSPHORUS: Phosphorus: 2.4 mg/dL — ABNORMAL LOW (ref 2.5–4.6)

## 2021-12-08 LAB — BASIC METABOLIC PANEL
Anion gap: 10 (ref 5–15)
BUN: 39 mg/dL — ABNORMAL HIGH (ref 6–20)
CO2: 19 mmol/L — ABNORMAL LOW (ref 22–32)
Calcium: 9.1 mg/dL (ref 8.9–10.3)
Chloride: 112 mmol/L — ABNORMAL HIGH (ref 98–111)
Creatinine, Ser: 1.75 mg/dL — ABNORMAL HIGH (ref 0.61–1.24)
GFR, Estimated: 46 mL/min — ABNORMAL LOW (ref >60)
Glucose, Bld: 253 mg/dL — ABNORMAL HIGH (ref 70–99)
Potassium: 3.8 mmol/L (ref 3.5–5.1)
Sodium: 141 mmol/L (ref 135–145)

## 2021-12-08 LAB — CK: Total CK: 138 U/L (ref 49–397)

## 2021-12-08 LAB — TSH: TSH: 0.744 u[IU]/mL (ref 0.350–4.500)

## 2021-12-08 LAB — MAGNESIUM: Magnesium: 2.2 mg/dL (ref 1.7–2.4)

## 2021-12-08 LAB — HIV ANTIBODY (ROUTINE TESTING W REFLEX): HIV Screen 4th Generation wRfx: NONREACTIVE

## 2021-12-08 MED ORDER — LORAZEPAM 2 MG/ML IJ SOLN
2.0000 mg | Freq: Four times a day (QID) | INTRAMUSCULAR | Status: DC
Start: 2021-12-08 — End: 2021-12-12
  Administered 2021-12-08 – 2021-12-12 (×16): 2 mg via INTRAVENOUS
  Filled 2021-12-08 (×16): qty 1

## 2021-12-08 MED ORDER — INSULIN ASPART 100 UNIT/ML IJ SOLN
5.0000 [IU] | INTRAMUSCULAR | Status: DC
Start: 2021-12-08 — End: 2021-12-09
  Administered 2021-12-08 – 2021-12-09 (×7): 5 [IU] via SUBCUTANEOUS

## 2021-12-08 NOTE — Progress Notes (Signed)
Paged about patient's temperature up to 102 orally and 104 rectally.  With new leukocytosis of 11 and stable chest x-ray, we are thinking this is more likely a fever driven by central nervous system dysfunction rather than infection.  We went to see the patient, he is still not responding to questions or commands and he is more rigid that earlier. We will order a UA, RPR, repeat TSH, HIV. We will consider LP if he does not again improve as he did yesterday after ativan.

## 2021-12-08 NOTE — Progress Notes (Signed)
HD#7 Subjective:   Summary:  [name]. is a 56 y.o. male with PMHx of PTSD, bipolar disorder, schizoaffective disorder, medication-induced tremors, polypharmacy, T2DM, and HTN who presented to the ED on 12/01/2021 with altered mental status secondary to polypharmacy.  Overnight Events: No acute events.  This morning he is lying in bed and is minimally responsive to questions but appears awake.  He does wince to pain but he does not answer any questions verbally or nonverbally.  Objective:  Vital signs in last 24 hours: Vitals:   12/07/21 1944 12/07/21 2327 12/08/21 0317 12/08/21 0802  BP: (!) 158/91 (!) 154/93 (!) 162/93 (!) 162/100  Pulse: (!) 102 (!) 101 (!) 107 (!) 112  Resp: (!) 22 (!) 26 (!) 23 (!) 33  Temp: 98.1 F (36.7 C) 99.1 F (37.3 C) 98.5 F (36.9 C) 100 F (37.8 C)  TempSrc: Axillary Axillary Axillary Axillary  SpO2: 94% 90% 94% 94%  Weight:      Height:       Supplemental O2: Room Air SpO2: 94 % O2 Flow Rate (L/min): 1 L/min   Physical Exam:  Constitutional: Ill-appearing gentleman laying in bed HENT: normocephalic atraumatic Eyes: conjunctiva non-erythematous, no obvious nystagmus but patient not able to follow commands to test extraocular movements Cardiovascular: Tachycardic but regular rhythm Pulmonary/Chest: Tachypneic, expiratory rhonchi heard mainly over the trachea MSK: normal bulk and tone Neurological: Awake but not responsive to questioning or commands Skin: warm and dry.  5 cm round blister on right heel with unbroken epidermis and clear fluid contained.  Well healing eschar over right elbow measuring about 1 cm x 2 cm  Filed Weights   12/02/21 0049  Weight: 91.9 kg     Intake/Output Summary (Last 24 hours) at 12/08/2021 0833 Last data filed at 12/08/2021 0319 Gross per 24 hour  Intake 1595.83 ml  Output 950 ml  Net 645.83 ml   Net IO Since Admission: 6,015.54 mL [12/08/21 0833]  Pertinent Labs:    Latest Ref Rng &  Units 12/07/2021    4:14 AM 12/06/2021    3:05 AM 12/05/2021    2:38 AM  CBC  WBC 4.0 - 10.5 K/uL 8.7  7.5  7.2   Hemoglobin 13.0 - 17.0 g/dL 13.0  12.9  13.0   Hematocrit 39.0 - 52.0 % 35.8  37.3  38.2   Platelets 150 - 400 K/uL 154  127  141        Latest Ref Rng & Units 12/08/2021    6:19 AM 12/07/2021    4:14 AM 12/06/2021    3:05 AM  CMP  Glucose 70 - 99 mg/dL 253  153  230   BUN 6 - 20 mg/dL 39  36  35   Creatinine 0.61 - 1.24 mg/dL 1.75  1.59  1.77   Sodium 135 - 145 mmol/L 141  143  139   Potassium 3.5 - 5.1 mmol/L 3.8  3.5  3.6   Chloride 98 - 111 mmol/L 112  112  109   CO2 22 - 32 mmol/L '19  22  22   '$ Calcium 8.9 - 10.3 mg/dL 9.1  9.3  9.0     Imaging: No results found.  Assessment/Plan:   Principal Problem:   Encephalopathy acute Active Problems:   Schizoaffective disorder, bipolar type ([place])   Diabetic ketoacidosis without coma associated with type 2 diabetes mellitus (HCC)   Thyroid nodule   Hypernatremia   Stage 2 skin ulcer of sacral region (  [place])   Malnutrition of moderate degree   Patient Summary: Jonathan Olarte. is a 56 y.o. with a pertinent PMH of PTSD, bipolar disorder, schizoaffective disorder, medication-induced tremors, polypharmacy, who presented with confusion and admitted for acute encephalopathy and polypharmacy. He has had improvement in his mentation over the past few days with holding his anti-psychotics.    Toxic metabolic encephalopathy Etiology likely polypharmacy with multiple centrally acting medications and possible withdrawal with sudden cessation of these.  He was showing steady improvement but today he is not responding to questions or commands.  He did spike a fever yesterday after we decreased the frequency of his Ativan so we will revert back to every 6 hours.  He was also tachypneic in the room up to 40 so we ordered his chest x-ray which was largely unchanged from prior and showed minimal atelectasis in the left base.  Will  continue to hold his home centrally acting medications and continue PRN ativan. Will reconsult psychiatry as we begin to re-initiate his home medications in the coming days.  -continue ativan 2 mg, shorten back to q6h   Enteral nutrition  Malnutrition Hyperglycemia Tolerating cortrak feedings well. Improvement in hyperglycemia with SSI q4h and semglee 25 U daily. Adjust insulin as needed and initiate diet once allowed. Electrolytes are stable, no need for additional fluids at this time. With his severe illness, will start IV PPI for ulcer prevention  -continue cortrak and hyperglycemia management with SSI and semglee 25 U - Add scheduled NovoLog 5 units q4h -free water flushes 125 mL q4h -thiamine 100 mg daily   Acute kidney injury on CKD  Unclear baseline, could be around 1.5-2.  Has had improvement in Cr and GFR during hospitalization with IV fluids and resolution of his rhabdomyolysis and dehydration.  Creatinine 1.75 today. -daily cmp   Hypertension Elevated in setting of severe illness. Continue to monitor   Pressure injuries Patient with pressure injury to right elbow, right lower extremity and right sacrum. Continue with frequent rolling and management of wounds per nursing staff.    Diet: Tube Feeds IVF: None,None VTE: Enoxaparin Code: DNR PT/OT recs: Pending assessment   Dispo: Anticipated discharge pending further resolution of acute encephalopathy  Jonathan Blamer DO Internal Medicine Resident PGY-1 Pager: [phone]  Please contact the on call pager after 5 pm and on weekends at [phone].

## 2021-12-09 ENCOUNTER — Inpatient Hospital Stay (HOSPITAL_COMMUNITY): Payer: Medicare Other

## 2021-12-09 DIAGNOSIS — R739 Hyperglycemia, unspecified: Secondary | ICD-10-CM

## 2021-12-09 DIAGNOSIS — N189 Chronic kidney disease, unspecified: Secondary | ICD-10-CM

## 2021-12-09 DIAGNOSIS — N179 Acute kidney failure, unspecified: Secondary | ICD-10-CM | POA: Diagnosis not present

## 2021-12-09 DIAGNOSIS — E46 Unspecified protein-calorie malnutrition: Secondary | ICD-10-CM

## 2021-12-09 DIAGNOSIS — G929 Unspecified toxic encephalopathy: Secondary | ICD-10-CM

## 2021-12-09 DIAGNOSIS — Z87891 Personal history of nicotine dependence: Secondary | ICD-10-CM

## 2021-12-09 DIAGNOSIS — L89890 Pressure ulcer of other site, unstageable: Secondary | ICD-10-CM

## 2021-12-09 DIAGNOSIS — I129 Hypertensive chronic kidney disease with stage 1 through stage 4 chronic kidney disease, or unspecified chronic kidney disease: Secondary | ICD-10-CM | POA: Diagnosis not present

## 2021-12-09 LAB — GLUCOSE, CAPILLARY
Glucose-Capillary: 249 mg/dL — ABNORMAL HIGH (ref 70–99)
Glucose-Capillary: 283 mg/dL — ABNORMAL HIGH (ref 70–99)
Glucose-Capillary: 214 mg/dL — ABNORMAL HIGH (ref 70–99)
Glucose-Capillary: 187 mg/dL — ABNORMAL HIGH (ref 70–99)
Glucose-Capillary: 195 mg/dL — ABNORMAL HIGH (ref 70–99)

## 2021-12-09 LAB — CBC
HCT: 45.8 % (ref 39.0–52.0)
Hemoglobin: 15.4 g/dL (ref 13.0–17.0)
MCH: 28.7 pg (ref 26.0–34.0)
MCHC: 33.6 g/dL (ref 30.0–36.0)
MCV: 85.3 fL (ref 80.0–100.0)
Platelets: 214 10*3/uL (ref 150–400)
RBC: 5.37 MIL/uL (ref 4.22–5.81)
RDW: 13.2 % (ref 11.5–15.5)
WBC: 23.8 10*3/uL — ABNORMAL HIGH (ref 4.0–10.5)
nRBC: 0.1 % (ref 0.0–0.2)

## 2021-12-09 LAB — MRSA NEXT GEN BY PCR, NASAL: MRSA by PCR Next Gen: NOT DETECTED

## 2021-12-09 LAB — CSF CELL COUNT WITH DIFFERENTIAL
RBC Count, CSF: 1016 /mm3 — ABNORMAL HIGH
Tube #: 1
WBC, CSF: 2 /mm3 (ref 0–5)

## 2021-12-09 LAB — CK: Total CK: 539 U/L — ABNORMAL HIGH (ref 49–397)

## 2021-12-09 LAB — BASIC METABOLIC PANEL
Anion gap: 10 (ref 5–15)
BUN: 44 mg/dL — ABNORMAL HIGH (ref 6–20)
CO2: 24 mmol/L (ref 22–32)
Calcium: 9.6 mg/dL (ref 8.9–10.3)
Chloride: 112 mmol/L — ABNORMAL HIGH (ref 98–111)
Creatinine, Ser: 1.9 mg/dL — ABNORMAL HIGH (ref 0.61–1.24)
GFR, Estimated: 42 mL/min — ABNORMAL LOW (ref >60)
Glucose, Bld: 236 mg/dL — ABNORMAL HIGH (ref 70–99)
Potassium: 4.8 mmol/L (ref 3.5–5.1)
Sodium: 146 mmol/L — ABNORMAL HIGH (ref 135–145)

## 2021-12-09 LAB — HEPATIC FUNCTION PANEL
ALT: 23 U/L (ref 0–44)
AST: 30 U/L (ref 15–41)
Albumin: 3 g/dL — ABNORMAL LOW (ref 3.5–5.0)
Alkaline Phosphatase: 66 U/L (ref 38–126)
Bilirubin, Direct: 0.2 mg/dL (ref 0.0–0.2)
Indirect Bilirubin: 0.1 mg/dL — ABNORMAL LOW (ref 0.3–0.9)
Total Bilirubin: 0.3 mg/dL (ref 0.3–1.2)
Total Protein: 6.9 g/dL (ref 6.5–8.1)

## 2021-12-09 LAB — CULTURE, BLOOD (ROUTINE X 2)
Culture: NO GROWTH
Culture: NO GROWTH
Special Requests: ADEQUATE
Special Requests: ADEQUATE

## 2021-12-09 LAB — PROTEIN AND GLUCOSE, CSF
Glucose, CSF: 139 mg/dL — ABNORMAL HIGH (ref 40–70)
Total  Protein, CSF: 48 mg/dL — ABNORMAL HIGH (ref 15–45)

## 2021-12-09 LAB — RPR: RPR Ser Ql: NONREACTIVE

## 2021-12-09 MED ORDER — INSULIN ASPART 100 UNIT/ML IJ SOLN
8.0000 [IU] | INTRAMUSCULAR | Status: DC
  Administered 2021-12-09 – 2021-12-10 (×6): 8 [IU] via SUBCUTANEOUS

## 2021-12-09 MED ORDER — INSULIN GLARGINE-YFGN 100 UNIT/ML ~~LOC~~ SOLN
25.0000 [IU] | Freq: Every day | SUBCUTANEOUS | Status: DC
  Administered 2021-12-10: 25 [IU] via SUBCUTANEOUS
  Filled 2021-12-09 (×2): qty 0.25

## 2021-12-09 MED ORDER — INSULIN GLARGINE-YFGN 100 UNIT/ML ~~LOC~~ SOLN: 35.0000 [IU] | Freq: Every day | SUBCUTANEOUS | Status: DC

## 2021-12-09 MED ORDER — LIDOCAINE HCL (PF) 1 % IJ SOLN
5.0000 mL | Freq: Once | INTRAMUSCULAR | Status: AC
  Administered 2021-12-09: 3 mL

## 2021-12-09 NOTE — Progress Notes (Signed)
Speech Language Pathology Treatment: Dysphagia  Patient Details Name: [name]. MRN: [id number] DOB: 1966/03/05 Today's Date: 12/09/2021 Time: 8270-7867 SLP Time Calculation (min) (ACUTE ONLY): 14 min  Assessment / Plan / Recommendation Clinical Impression  Pt alert, verbally responsive, but respirations are wet at baseline, voice very soft. SLP repositioned pt upright and applied suction to base of tongue area with minimal gag response. Retrieved minimal white secretions. Offered pt ice and he easily accepted one bite with slow mastication and eventual swallow. Attempted a straw sip of water and pt unable to draw up liquid to mouth, only a trace amount arrived, which pt orally held, eventually opened mouth without swallowing and then coughed. Second attempt at ice also resulted in coughing. Pt is still not capable of successful oral intake due to mentation, but also poor secretion management. Will continue efforts .  HPI HPI: Pt is a 56 year old male who presented 8/13 after he was found down at home. EEg 8/16: suggestive of moderate to severe diffuse encephalopathy, most likely secondary to toxic-metabolic causes. Psychotropic withdrawal suspected. CT head 8/16 and MRI brain 8/13 negative for acute changes. PMH: bipolar 1, PTSD, schizoaffective disorder, substance abuse, polypharmacy, DM2, CKD, hip osteoarthritis.      SLP Plan  Continue with current plan of care      Recommendations for follow up therapy are one component of a multi-disciplinary discharge planning process, led by the attending physician.  Recommendations may be updated based on patient status, additional functional criteria and insurance authorization.    Recommendations  Diet recommendations: NPO                Follow Up Recommendations: Skilled nursing-short term rehab (<3 hours/day) Plan: Continue with current plan of care           Kamryn Gauthier, Katherene Ponto  12/09/2021, 1:39 PM

## 2021-12-09 NOTE — Procedures (Signed)
Technically successful fluoro guided LP at L3-L4 level with opening pressure of 14 cm H2O. No closing pressure obtained.  12 cc of clear, cololess CSF sent to lab for analysis.  No immediate post procedural complication.  Please see imaging section of Epic for full dictation.    Narda Rutherford, AGNP-BC 12/09/2021, 4:51 PM

## 2021-12-09 NOTE — Inpatient Diabetes Management (Signed)
Inpatient Diabetes Program Recommendations  AACE/ADA: New Consensus Statement on Inpatient Glycemic Control (2015)  Target Ranges:  Prepandial:   less than 140 mg/dL      Peak postprandial:   less than 180 mg/dL (1-2 hours)      Critically ill patients:  140 - 180 mg/dL   Lab Results  Component Value Date   GLUCAP 283 (H) 12/09/2021   HGBA1C 8.0 (H) 12/02/2021    Review of Glycemic Control  Latest Reference Range & Units 12/08/21 08:21 12/08/21 12:08 12/08/21 16:34 12/08/21 19:45 12/08/21 23:04 12/09/21 03:19 12/09/21 07:57  Glucose-Capillary 70 - 99 mg/dL 271 (H)  Novolog 5 units  Semglee 25 units 269 (H)  Novolog 10 units 267 (H)  Novolog 10 units 246 (H)  Novolog 8 units 231 (H)  Novolog 8 units 249 (H)  Novolog 8 units 283 (H)   Diabetes history: DM 2 Outpatient Diabetes medications: Lantus 45-50 units Daily, Humalog 10-14 units tid, Tradjenta 5 mg Daily  Current orders for Inpatient glycemic control:  Novolog 0-9 units tid + hs Semglee 25 units Daily Novolog 5 units tid meal coverage  Glucerna 70 ml/hour  Inpatient Diabetes Program Recommendations:    -   Consider increasing Tube Feed coverage to 8 units Q4 hours  Thanks,  Tama Headings RN, MSN, BC-ADM Inpatient Diabetes Coordinator Team Pager [phone] (8a-5p)

## 2021-12-09 NOTE — Progress Notes (Signed)
HD#8 Subjective:   Summary:  [name]. is a 56 y.o. male with PMHx of PTSD, bipolar disorder, schizoaffective disorder, medication-induced tremors, polypharmacy, T2DM, and HTN who presented to the ED on 12/01/2021 with altered mental status secondary to polypharmacy.  Overnight Events: Yesterday morning patient became febrile which spiked to 104 at about 4 PM and eventually broke at around midnight.  He was treated with Tylenol, ice packs, and cooling blanket.  His white count did go up to 11 yesterday but his BP and heart rate remained stable.  Blood cultures were taken, MRSA swab, and CT head was ordered.  Today patient is more responsive and will answer most questions with either nod or shake of his head.  He also verbalizes complaints including his chills and some abdominal pain.  He is not able to fully explain or answer follow-up questions.  Objective:  Vital signs in last 24 hours: Vitals:   12/09/21 0503 12/09/21 0648 12/09/21 0703 12/09/21 1153  BP: (!) 153/102  (!) 170/100 (!) 163/99  Pulse:   (!) 108 (!) 106  Resp: '15 18 18 17  '$ Temp: 99.1 F (37.3 C)  98.9 F (37.2 C) 99.3 F (37.4 C)  TempSrc: Rectal  Rectal Rectal  SpO2:   91% 95%  Weight:      Height:       Supplemental O2: Room Air SpO2: 95 % O2 Flow Rate (L/min): 1 L/min   Physical Exam:  Constitutional: Ill-appearing gentleman laying in bed HENT: normocephalic atraumatic Eyes: conjunctiva non-erythematous Cardiovascular: Tachycardic but regular rhythm Pulmonary/Chest: Tachypneic, expiratory rhonchi heard mainly over the trachea MSK: normal bulk and tone Neurological: Awake and alert but not able to answer orientation questions. Skin: warm and dry.  5 cm round blister on right heel with unbroken epidermis and clear fluid contained.  Well healing eschar over right elbow measuring about 1 cm x 2 cm  Filed Weights   12/02/21 0049 12/09/21 0336  Weight: 91.9 kg 88.2 kg     Intake/Output  Summary (Last 24 hours) at 12/09/2021 1508 Last data filed at 12/09/2021 1359 Gross per 24 hour  Intake 1000 ml  Output 1350 ml  Net -350 ml   Net IO Since Admission: 5,036.71 mL [12/09/21 1508]  Pertinent Labs:    Latest Ref Rng & Units 12/09/2021    3:44 AM 12/08/2021    6:19 AM 12/07/2021    4:14 AM  CBC  WBC 4.0 - 10.5 K/uL 23.8  11.7  8.7   Hemoglobin 13.0 - 17.0 g/dL 15.4  13.4  13.0   Hematocrit 39.0 - 52.0 % 45.8  39.3  35.8   Platelets 150 - 400 K/uL 214  206  154        Latest Ref Rng & Units 12/09/2021    3:44 AM 12/08/2021    6:19 AM 12/07/2021    4:14 AM  CMP  Glucose 70 - 99 mg/dL 236  253  153   BUN 6 - 20 mg/dL 44  39  36   Creatinine 0.61 - 1.24 mg/dL 1.90  1.75  1.59   Sodium 135 - 145 mmol/L 146  141  143   Potassium 3.5 - 5.1 mmol/L 4.8  3.8  3.5   Chloride 98 - 111 mmol/L 112  112  112   CO2 22 - 32 mmol/L '24  19  22   '$ Calcium 8.9 - 10.3 mg/dL 9.6  9.1  9.3     Imaging: CT HEAD  WO CONTRAST (5MM)  Result Date: 12/09/2021 CLINICAL DATA:  Provided history: Mental status change, unknown cause. EXAM: CT HEAD WITHOUT CONTRAST TECHNIQUE: Contiguous axial images were obtained from the base of the skull through the vertex without intravenous contrast. RADIATION DOSE REDUCTION: This exam was performed according to the departmental dose-optimization program which includes automated exposure control, adjustment of the mA and/or kV according to patient size and/or use of iterative reconstruction technique. COMPARISON:  Prior head CT examinations 12/04/2021 and earlier. Brain MRI 12/01/2021. Brain MRI 05/22/2021. FINDINGS: Brain: Mild-to-moderate generalized cerebral atrophy, advanced for age. There is no acute intracranial hemorrhage. No demarcated cortical infarct. No extra-axial fluid collection. No evidence of an intracranial mass. No midline shift. Vascular: No hyperdense vessel. Atherosclerotic calcifications. Skull: No fracture or aggressive osseous lesion.  Sinuses/Orbits: No mass or acute finding within the imaged orbits. No significant paranasal sinus disease at the imaged levels. Other: Nonspecific 14 mm cutaneous/subcutaneous cystic appearing lesion within the left upper posterior neck (series 4, image 2). IMPRESSION: No evidence of acute intracranial abnormality. Mild-to-moderate generalized cerebral atrophy, advanced for age. Nonspecific 14 mm cutaneous/subcutaneous cystic appearing lesion within the left upper posterior neck. Direct visualization recommended. Electronically Signed   By: Kellie Simmering D.O.   On: 12/09/2021 13:08    Assessment/Plan:   Principal Problem:   Encephalopathy acute Active Problems:   Schizoaffective disorder, bipolar type ([place])   Diabetic ketoacidosis without coma associated with type 2 diabetes mellitus (HCC)   Thyroid nodule   Hypernatremia   Stage 2 skin ulcer of sacral region [facility])   Malnutrition of moderate degree   Patient Summary: [name]. is a 56 y.o. with a pertinent PMH of PTSD, bipolar disorder, schizoaffective disorder, medication-induced tremors, polypharmacy, who presented with confusion and admitted for acute encephalopathy and polypharmacy. He has had improvement in his mentation over the past few days with holding his anti-psychotics.    Toxic metabolic encephalopathy Fever Etiology likely polypharmacy with multiple centrally acting medications and possible withdrawal with sudden cessation of these.  He was showing steady improvement but from the past 3 days he has shown decreased engagement.  He did spike a fever again yesterday which peaked at 104.6 and lasted about 12 hours.  CT of the head showed a nonspecific 14 mm cutaneous/subcutaneous cystic-appearing lesion within the left upper posterior neck.  We also ordered Bartonella antibody, CK, and lumbar puncture with fluid studies.  -continue ativan 2 mg, shorten back to q6h -Pending lumbar puncture and labs as above. -We will  consider antibiotics as we monitor cultures and get lumbar puncture results.   Enteral nutrition  Malnutrition Hyperglycemia Tolerating cortrak feedings well. Improvement in hyperglycemia with SSI q4h and semglee 25 U daily. With his severe illness, started IV PPI for ulcer prevention.  Along with the recent increase in fevers his sugars have been higher with fasting sugars around 250.  We will increase his long-acting. -continue cortrak and hyperglycemia management with SSI and increase semglee to 35 U - Add scheduled NovoLog 5 units q4h -free water flushes 125 mL q4h -thiamine 100 mg daily   Acute kidney injury on CKD  Unclear baseline, could be around 1.5-2.  Has had improvement in Cr and GFR during hospitalization with IV fluids and resolution of his rhabdomyolysis and dehydration.  Creatinine 1.75 today. -daily cmp   Hypertension Elevated in setting of severe illness. Continue to monitor   Pressure injuries Patient with pressure injury to right elbow, right lower extremity and right sacrum. Continue  with frequent rolling and management of wounds per nursing staff.    Diet: Tube Feeds IVF: None,None VTE: Enoxaparin Code: DNR PT/OT recs: Pending assessment   Dispo: Anticipated discharge pending further resolution of acute encephalopathy   [name] DO Internal Medicine Resident PGY-1 Pager: [phone]  Please contact the on call pager after 5 pm and on weekends at [phone].

## 2021-12-10 DIAGNOSIS — N179 Acute kidney failure, unspecified: Secondary | ICD-10-CM | POA: Diagnosis not present

## 2021-12-10 DIAGNOSIS — G929 Unspecified toxic encephalopathy: Secondary | ICD-10-CM | POA: Diagnosis not present

## 2021-12-10 DIAGNOSIS — E46 Unspecified protein-calorie malnutrition: Secondary | ICD-10-CM | POA: Diagnosis not present

## 2021-12-10 DIAGNOSIS — I129 Hypertensive chronic kidney disease with stage 1 through stage 4 chronic kidney disease, or unspecified chronic kidney disease: Secondary | ICD-10-CM | POA: Diagnosis not present

## 2021-12-10 LAB — GLUCOSE, CAPILLARY
Glucose-Capillary: 185 mg/dL — ABNORMAL HIGH (ref 70–99)
Glucose-Capillary: 186 mg/dL — ABNORMAL HIGH (ref 70–99)
Glucose-Capillary: 261 mg/dL — ABNORMAL HIGH (ref 70–99)
Glucose-Capillary: 244 mg/dL — ABNORMAL HIGH (ref 70–99)
Glucose-Capillary: 137 mg/dL — ABNORMAL HIGH (ref 70–99)
Glucose-Capillary: 134 mg/dL — ABNORMAL HIGH (ref 70–99)

## 2021-12-10 LAB — COMPREHENSIVE METABOLIC PANEL
ALT: 23 U/L (ref 0–44)
AST: 36 U/L (ref 15–41)
Albumin: 3.1 g/dL — ABNORMAL LOW (ref 3.5–5.0)
Alkaline Phosphatase: 71 U/L (ref 38–126)
Anion gap: 11 (ref 5–15)
BUN: 57 mg/dL — ABNORMAL HIGH (ref 6–20)
CO2: 23 mmol/L (ref 22–32)
Calcium: 9.9 mg/dL (ref 8.9–10.3)
Chloride: 112 mmol/L — ABNORMAL HIGH (ref 98–111)
Creatinine, Ser: 1.71 mg/dL — ABNORMAL HIGH (ref 0.61–1.24)
GFR, Estimated: 47 mL/min — ABNORMAL LOW (ref >60)
Glucose, Bld: 222 mg/dL — ABNORMAL HIGH (ref 70–99)
Potassium: 4.6 mmol/L (ref 3.5–5.1)
Sodium: 146 mmol/L — ABNORMAL HIGH (ref 135–145)
Total Bilirubin: 0.6 mg/dL (ref 0.3–1.2)
Total Protein: 7.1 g/dL (ref 6.5–8.1)

## 2021-12-10 LAB — CBC
HCT: 42.3 % (ref 39.0–52.0)
Hemoglobin: 14.4 g/dL (ref 13.0–17.0)
MCH: 28.8 pg (ref 26.0–34.0)
MCHC: 34 g/dL (ref 30.0–36.0)
MCV: 84.6 fL (ref 80.0–100.0)
Platelets: 274 10*3/uL (ref 150–400)
RBC: 5 MIL/uL (ref 4.22–5.81)
RDW: 13.1 % (ref 11.5–15.5)
WBC: 16.1 10*3/uL — ABNORMAL HIGH (ref 4.0–10.5)
nRBC: 0.2 % (ref 0.0–0.2)

## 2021-12-10 LAB — VZV PCR, CSF: VZV PCR, CSF: NEGATIVE

## 2021-12-10 LAB — HSV 1/2 PCR, CSF
HSV-1 DNA: NEGATIVE
HSV-2 DNA: NEGATIVE

## 2021-12-10 LAB — CYTOLOGY - NON PAP

## 2021-12-10 MED ORDER — FREE WATER
200.0000 mL | Status: DC
Start: 2021-12-10 — End: 2021-12-11
  Administered 2021-12-10 – 2021-12-11 (×7): 200 mL

## 2021-12-10 MED ORDER — ORAL CARE MOUTH RINSE
15.0000 mL | OROMUCOSAL | Status: DC
  Administered 2021-12-10 – 2021-12-11 (×5): 15 mL via OROMUCOSAL

## 2021-12-10 MED ORDER — INSULIN ASPART 100 UNIT/ML IJ SOLN
10.0000 [IU] | INTRAMUSCULAR | Status: DC
Start: 2021-12-10 — End: 2021-12-12
  Administered 2021-12-10 – 2021-12-12 (×11): 10 [IU] via SUBCUTANEOUS

## 2021-12-10 NOTE — Progress Notes (Addendum)
HD#9 Subjective:   Summary: Jonathan Plascencia. is a 56 y.o. male with PMHx of PTSD, bipolar disorder, schizoaffective disorder, medication-induced tremors, polypharmacy, T2DM, and HTN who presented to the ED on 12/01/2021 with altered mental status secondary to polypharmacy.  Overnight Events: No acute events  Patient was doing much better today and is speaking more.  He denies any new or worsening issues.  Objective:  Vital signs in last 24 hours: Vitals:   12/10/21 0400 12/10/21 0756 12/10/21 1145 12/10/21 1551  BP: (!) 157/94 (!) 157/87 (!) 144/91 (!) 145/86  Pulse:  (!) 101 100 94  Resp:  '18 20 17  '$ Temp: 98.8 F (37.1 C) (!) 97.3 F (36.3 C) 98.1 F (36.7 C) 98.2 F (36.8 C)  TempSrc: Rectal Axillary Axillary Axillary  SpO2: 100% 96% 97% 97%  Weight:      Height:       Supplemental O2: Room Air SpO2: 97 % O2 Flow Rate (L/min): 1 L/min   Physical Exam:  Constitutional: Ill-appearing gentleman laying in bed HENT: normocephalic atraumatic, 2 x 2 centimeter sebaceous cyst just inferior to the left occiput with no surrounding edema or erythema and nontender to palpation Eyes: conjunctiva non-erythematous, pupils equal and reactive to light Pulmonary/Chest: Normal work of breathing MSK: normal bulk and tone Neurological: Awake and alert  Skin: warm and dry.  Well healing eschar over right elbow measuring about 1 cm x 2 cm  Filed Weights   12/02/21 0049 12/09/21 0336  Weight: 91.9 kg 88.2 kg     Intake/Output Summary (Last 24 hours) at 12/10/2021 1703 Last data filed at 12/10/2021 1601 Gross per 24 hour  Intake --  Output 700 ml  Net -700 ml   Net IO Since Admission: 4,336.71 mL [12/10/21 1703]  Pertinent Labs:    Latest Ref Rng & Units 12/10/2021    4:00 AM 12/09/2021    3:44 AM 12/08/2021    6:19 AM  CBC  WBC 4.0 - 10.5 K/uL 16.1  23.8  11.7   Hemoglobin 13.0 - 17.0 g/dL 14.4  15.4  13.4   Hematocrit 39.0 - 52.0 % 42.3  45.8  39.3   Platelets 150 -  400 K/uL 274  214  206        Latest Ref Rng & Units 12/10/2021    4:00 AM 12/09/2021    1:37 PM 12/09/2021    3:44 AM  CMP  Glucose 70 - 99 mg/dL 222   236   BUN 6 - 20 mg/dL 57   44   Creatinine 0.61 - 1.24 mg/dL 1.71   1.90   Sodium 135 - 145 mmol/L 146   146   Potassium 3.5 - 5.1 mmol/L 4.6   4.8   Chloride 98 - 111 mmol/L 112   112   CO2 22 - 32 mmol/L 23   24   Calcium 8.9 - 10.3 mg/dL 9.9   9.6   Total Protein 6.5 - 8.1 g/dL 7.1  6.9    Total Bilirubin 0.3 - 1.2 mg/dL 0.6  0.3    Alkaline Phos 38 - 126 U/L 71  66    AST 15 - 41 U/L 36  30    ALT 0 - 44 U/L 23  23      Imaging: No results found.  Assessment/Plan:   Principal Problem:   Encephalopathy acute Active Problems:   Schizoaffective disorder, bipolar type ([place])   Diabetic ketoacidosis without coma associated with type 2 diabetes  mellitus ([place])   Thyroid nodule   Hypernatremia   Stage 2 skin ulcer of sacral region [facility])   Malnutrition of moderate degree   Patient Summary: Jonathan Winegarden. is a 56 y.o. with a pertinent PMH of PTSD, bipolar disorder, schizoaffective disorder, medication-induced tremors, polypharmacy, who presented with confusion and admitted for acute encephalopathy and polypharmacy. He has had improvement in his mentation over the past few days with holding his anti-psychotics.    Toxic metabolic encephalopathy Fever Etiology likely polypharmacy with multiple centrally acting medications and possible withdrawal with sudden cessation of these.  He was showing steady improvement but developed a fever after showing a decrease in his mentation.  Fever peaked at 104.6 and lasted about 12 hours.  CT of the head showed a nonspecific 14 mm cutaneous/subcutaneous cystic-appearing lesion within the left upper posterior neck.  Lumbar puncture and CSF studies showed increased glucose and protein and red blood cells with a few lymphocytes and PMNs but there was no white blood cells or organisms seen on  Gram stain and cultures, HSV, VZV, and Bartonella antibodies pending.  CK rose back to 539.  We have not noticed any new or worsening lymphadenopathy in the groin, subclavicular, or cervical chains.  This appears to be still a central acting process and less likely to be infectious cause of encephalopathy.  We will continue supportive measures and involve psychiatry when he is able to speak more coherently. -continue ativan 2 mg, shorten back to q6h - pending labs as above - Repeat CK tomorrow   Enteral nutrition  Malnutrition Hyperglycemia Hypernatremia Tolerating cortrak feedings well. Improvement in hyperglycemia with SSI q4h, scheduled q4h novolog, and semglee 25 U daily. With his severe illness, started IV PPI for ulcer prevention.   -continue cortrak and Semglee 25 units with SSI -scheduled NovoLog 10 units q4h -free water flushes 200 mL q4h, we will reevaluate with morning BMP -thiamine 100 mg daily   Acute kidney injury on CKD  Unclear baseline, could be around 1.5-2.  Has had improvement in Cr and GFR during hospitalization with IV fluids and resolution of his rhabdomyolysis and dehydration.  Creatinine 1.71 today. -daily cmp   Hypertension Elevated in setting of severe illness. Continue to monitor   Pressure injuries Patient with pressure injury to right elbow, right lower extremity and right sacrum. Continue with frequent rolling and management of wounds per nursing staff.    Diet: Tube Feeds IVF: None,None VTE: Enoxaparin Code: DNR PT/OT recs: Pending assessment   Dispo: Anticipated discharge pending further resolution of acute encephalopathy   Jonathan Blamer DO Internal Medicine Resident PGY-1 Pager: [phone]  Please contact the on call pager after 5 pm and on weekends at [phone].

## 2021-12-10 NOTE — Progress Notes (Signed)
Speech Language Pathology Treatment: Dysphagia  Patient Details Name: [name]. MRN: [id number] DOB: Jun 08, 1965 Today's Date: 12/10/2021 Time: 7588-3254 SLP Time Calculation (min) (ACUTE ONLY): 28 min  Assessment / Plan / Recommendation Clinical Impression  Pt presents as yesterday; alert and responsive, able to f/c but profoundly weak, with hypophonic wet vocal quality. Pt trying to cough to clear but without success. SLP mobilized pt as best as possible, sitting him upright, leaning chest forward with some activation from pt, which did result in increased force of cough. With suction applied to base of tongue more secretions retried. SLP brushed pts teeth and again encouraged cough. Offered teaspoons of nectar thick juice to encourage swallow initiation both to evaluate swallowing, but also to stimulate swallowing of secretions. Pt needs PT/OT to mobilize if possible. Mobilization will improve mentation and cough mechanics. Messaging MD to request if medically ready. No progress with SLP today.   HPI HPI: Pt is a 56 year old male who presented 8/13 after he was found down at home. EEg 8/16: suggestive of moderate to severe diffuse encephalopathy, most likely secondary to toxic-metabolic causes. Psychotropic withdrawal suspected. CT head 8/16 and MRI brain 8/13 negative for acute changes. PMH: bipolar 1, PTSD, schizoaffective disorder, substance abuse, polypharmacy, DM2, CKD, hip osteoarthritis.      SLP Plan  Continue with current plan of care      Recommendations for follow up therapy are one component of a multi-disciplinary discharge planning process, led by the attending physician.  Recommendations may be updated based on patient status, additional functional criteria and insurance authorization.    Recommendations  Diet recommendations: NPO                Follow Up Recommendations: Skilled nursing-short term rehab (<3 hours/day) Plan: Continue with current plan of  care           [name], [name]  12/10/2021, 9:02 AM

## 2021-12-11 ENCOUNTER — Inpatient Hospital Stay (HOSPITAL_COMMUNITY): Payer: Medicare Other

## 2021-12-11 ENCOUNTER — Encounter (HOSPITAL_COMMUNITY): Payer: Self-pay | Admitting: Internal Medicine

## 2021-12-11 DIAGNOSIS — E639 Nutritional deficiency, unspecified: Secondary | ICD-10-CM

## 2021-12-11 DIAGNOSIS — R509 Fever, unspecified: Secondary | ICD-10-CM

## 2021-12-11 DIAGNOSIS — L89152 Pressure ulcer of sacral region, stage 2: Secondary | ICD-10-CM

## 2021-12-11 DIAGNOSIS — L89899 Pressure ulcer of other site, unspecified stage: Secondary | ICD-10-CM

## 2021-12-11 DIAGNOSIS — L89029 Pressure ulcer of left elbow, unspecified stage: Secondary | ICD-10-CM

## 2021-12-11 DIAGNOSIS — E440 Moderate protein-calorie malnutrition: Secondary | ICD-10-CM | POA: Diagnosis not present

## 2021-12-11 DIAGNOSIS — G928 Other toxic encephalopathy: Secondary | ICD-10-CM | POA: Diagnosis not present

## 2021-12-11 LAB — COMPREHENSIVE METABOLIC PANEL
ALT: 25 U/L (ref 0–44)
AST: 32 U/L (ref 15–41)
Albumin: 2.8 g/dL — ABNORMAL LOW (ref 3.5–5.0)
Alkaline Phosphatase: 74 U/L (ref 38–126)
Anion gap: 10 (ref 5–15)
BUN: 63 mg/dL — ABNORMAL HIGH (ref 6–20)
CO2: 22 mmol/L (ref 22–32)
Calcium: 9.4 mg/dL (ref 8.9–10.3)
Chloride: 114 mmol/L — ABNORMAL HIGH (ref 98–111)
Creatinine, Ser: 1.65 mg/dL — ABNORMAL HIGH (ref 0.61–1.24)
GFR, Estimated: 49 mL/min — ABNORMAL LOW (ref >60)
Glucose, Bld: 160 mg/dL — ABNORMAL HIGH (ref 70–99)
Potassium: 4.5 mmol/L (ref 3.5–5.1)
Sodium: 146 mmol/L — ABNORMAL HIGH (ref 135–145)
Total Bilirubin: 0.4 mg/dL (ref 0.3–1.2)
Total Protein: 6.8 g/dL (ref 6.5–8.1)

## 2021-12-11 LAB — BENZODIAZEPINES,MS,WB/SP RFX
7-Aminoclonazepam: NEGATIVE ng/mL
Alprazolam: NEGATIVE ng/mL
Benzodiazepines Confirm: POSITIVE
Chlordiazepoxide: NEGATIVE
Clonazepam: NEGATIVE ng/mL
Desalkylflurazepam: NEGATIVE ng/mL
Desmethylchlordiazepoxide: NEGATIVE
Desmethyldiazepam: NEGATIVE ng/mL
Diazepam: NEGATIVE ng/mL
Flurazepam: NEGATIVE ng/mL
Lorazepam: 67 ng/mL
Midazolam: NEGATIVE ng/mL
Oxazepam: NEGATIVE ng/mL
Temazepam: NEGATIVE ng/mL
Triazolam: NEGATIVE ng/mL

## 2021-12-11 LAB — GLUCOSE, CAPILLARY
Glucose-Capillary: 110 mg/dL — ABNORMAL HIGH (ref 70–99)
Glucose-Capillary: 116 mg/dL — ABNORMAL HIGH (ref 70–99)
Glucose-Capillary: 126 mg/dL — ABNORMAL HIGH (ref 70–99)
Glucose-Capillary: 149 mg/dL — ABNORMAL HIGH (ref 70–99)
Glucose-Capillary: 167 mg/dL — ABNORMAL HIGH (ref 70–99)
Glucose-Capillary: 188 mg/dL — ABNORMAL HIGH (ref 70–99)
Glucose-Capillary: 237 mg/dL — ABNORMAL HIGH (ref 70–99)

## 2021-12-11 LAB — RESPIRATORY PANEL BY PCR

## 2021-12-11 LAB — CK: Total CK: 199 U/L (ref 49–397)

## 2021-12-11 LAB — DRUG SCREEN 10 W/CONF, SERUM
Amphetamines, IA: NEGATIVE ng/mL
Barbiturates, IA: NEGATIVE ug/mL
Benzodiazepines, IA: POSITIVE ng/mL — AB
Cocaine & Metabolite, IA: NEGATIVE ng/mL
Methadone, IA: NEGATIVE ng/mL
Opiates, IA: NEGATIVE ng/mL
Oxycodones, IA: NEGATIVE ng/mL
Phencyclidine, IA: NEGATIVE ng/mL
Propoxyphene, IA: NEGATIVE ng/mL
THC(Marijuana) Metabolite, IA: NEGATIVE ng/mL

## 2021-12-11 LAB — CBC
HCT: 40.9 % (ref 39.0–52.0)
Hemoglobin: 14.1 g/dL (ref 13.0–17.0)
MCH: 29.1 pg (ref 26.0–34.0)
MCHC: 34.5 g/dL (ref 30.0–36.0)
MCV: 84.5 fL (ref 80.0–100.0)
Platelets: 314 10*3/uL (ref 150–400)
RBC: 4.84 MIL/uL (ref 4.22–5.81)
RDW: 13.3 % (ref 11.5–15.5)
WBC: 16 10*3/uL — ABNORMAL HIGH (ref 4.0–10.5)
nRBC: 0.3 % — ABNORMAL HIGH (ref 0.0–0.2)

## 2021-12-11 LAB — RESP PANEL BY RT-PCR (FLU A&B, COVID) ARPGX2
Influenza A by PCR: NEGATIVE
Influenza B by PCR: NEGATIVE
SARS Coronavirus 2 by RT PCR: NEGATIVE

## 2021-12-11 LAB — BARTONELLA ANTIBODY PANEL
B Quintana IgM: NEGATIVE titer
B henselae IgG: NEGATIVE titer
B henselae IgM: NEGATIVE titer
B quintana IgG: NEGATIVE titer

## 2021-12-11 LAB — PROCALCITONIN: Procalcitonin: 0.51 ng/mL

## 2021-12-11 MED ORDER — GUAIFENESIN 100 MG/5ML PO LIQD
15.0000 mL | ORAL | Status: DC | PRN
Start: 2021-12-11 — End: 2021-12-11

## 2021-12-11 MED ORDER — GUAIFENESIN 100 MG/5ML PO LIQD
15.0000 mL | ORAL | Status: DC | PRN
  Administered 2021-12-11 – 2021-12-16 (×9): 15 mL
  Filled 2021-12-11 (×10): qty 15

## 2021-12-11 MED ORDER — INSULIN GLARGINE-YFGN 100 UNIT/ML ~~LOC~~ SOLN
30.0000 [IU] | Freq: Every day | SUBCUTANEOUS | Status: DC
  Administered 2021-12-11 – 2021-12-12 (×2): 30 [IU] via SUBCUTANEOUS
  Filled 2021-12-11 (×3): qty 0.3

## 2021-12-11 MED ORDER — ACETAMINOPHEN 650 MG RE SUPP
650.0000 mg | Freq: Four times a day (QID) | RECTAL | Status: DC | PRN
Start: 2021-12-11 — End: 2021-12-18

## 2021-12-11 MED ORDER — ACETAMINOPHEN 160 MG/5ML PO SOLN
650.0000 mg | Freq: Four times a day (QID) | ORAL | Status: DC | PRN
Start: 2021-12-11 — End: 2021-12-18
  Administered 2021-12-12 – 2021-12-16 (×4): 650 mg
  Filled 2021-12-11 (×5): qty 20.3

## 2021-12-11 MED ORDER — FREE WATER
400.0000 mL | Status: DC
  Administered 2021-12-11 – 2021-12-17 (×34): 400 mL

## 2021-12-11 MED ORDER — GUAIFENESIN 100 MG/5ML PO LIQD
15.0000 mL | ORAL | Status: DC | PRN
Start: 2021-12-11 — End: 2021-12-11
  Administered 2021-12-11: 15 mL via ORAL
  Filled 2021-12-11: qty 15

## 2021-12-11 MED ORDER — IPRATROPIUM-ALBUTEROL 0.5-2.5 (3) MG/3ML IN SOLN
3.0000 mL | RESPIRATORY_TRACT | Status: DC | PRN
  Administered 2021-12-12 – 2021-12-14 (×3): 3 mL via RESPIRATORY_TRACT
  Filled 2021-12-11 (×3): qty 3

## 2021-12-11 NOTE — Progress Notes (Signed)
Tachynpneic, increasing oxygen demand, currently at [employer]. No breathing treatments ordered. Rhochorous on auscultation . Decreased ability to clear his airway. Could benefit from aggressive pulmonary toileting. Non ordered. Informed on-call resident

## 2021-12-11 NOTE — Progress Notes (Signed)
OT Cancellation Note  Patient Details Name: [name]. MRN: [id number] DOB: October 06, 1965   Cancelled Treatment:    Reason Eval/Treat Not Completed: Patient's level of consciousness.  Decreased LOA, continue efforts as appropriate.  Nataliya Graig D Lutricia Widjaja 12/11/2021, 2:22 PM 12/11/2021  RP, OTR/L  Acute Rehabilitation Services  Office:  [phone]

## 2021-12-11 NOTE — Progress Notes (Signed)
Went to see patient due to concern about respiratory status.  Assessed patient in the room and also spoke to his sister.  She was tearful and concerned about his lack of progress and stated that she would like to continue full measures at this point but wants [place] to make sure he is comfortable and not in pain.  Mr. Hervey Ard himself was minimally interactive today.  Per RN, he has had desaturations throughout the day with increased work of breathing and diffuse rhonchi.  He was on room air at the start of the day but had to be placed on supplemental oxygen via [place] and oxygen has been increased to 5 L at this time.  He was assessed couple hours ago for similar reasons and procalcitonin, RVP and Robitussin were ordered.  He also had a chest x-ray earlier today which appeared unremarkable with the exception of marginal increase in atelectasis compared to prior chest x-ray.  Procalcitonin level has resulted at 0.51 compared to 0.12 from last week.  He also has continued risk of aspiration due to inability to completely clear his airway.  On assessment, he was tachycardic at 111, respiratory rate 27, BP 151/90, O2 sat 90% on 5 L nasal cannula.  Pupils dilated bilaterally.  Rigidity noted on exam.  Minimally interactive, opens eyes spontaneously but not tracking.  Not withdrawing to noxious stimuli, but occasionally moves his extremities spontaneously.  Increased work of breathing with accessory muscle use and diffuse rhonchi on auscultation.  Overall similar exam compared to prior today, but worse mentation compared to couple days ago.  #Concern for HAP #Dyspnea Due to elevated procalcitonin and concern for worsening mentation over the past couple days, will start antibiotics and take cultures at this time with likely source being aspiration pneumonia.  Chest x-ray was obtained this afternoon without significant worsening of respiratory status, so likely low benefit from repeat at this time.  We will add pulmonary  hygiene measures as well. - Start Vanco/Zosyn coverage after blood cultures - DuoNebs and pulmonary toilet.  Will consider chest PT and hypertonic nebs as well. - Follow-up RVP  #Encephalopathy His exam does seem slightly worse than what was described a few days ago, when he was speaking and interacting with team members, but stable compared to earlier today.  Decline could be possibly caused by infection as above, metabolic (he is increasingly uremic with BUN 63 today), or continued sequelae of CNS insult of unknown etiology (have ruled out stroke, bleeds, seizures during this admission, low suspicion of NMS or serotonin syndrome also at this time).  Of note, he possibly also has underlying paroxysmal sympathetic hyperactivity which she is currently getting Ativan 2 mg every 6 hours, can consider increasing symptomatic treatment for this, though this does not explain his increased oxygen requirement today.  Additionally, there has been noted concern for catatonia which had previously resolved as the patient became more interactive, however with his current decline he seems to be back in possible catatonic state.  Finally, though recent LP has been inconclusive so far, there were still some changes in CSF studies which were consistent with inflammation.  The patient has not gotten a full MRI scan with or without contrast during this admission (only DWI sequences early in the admission to rule out infarct), can consider this as a possibility. - Starting CAP coverage and infectious/respiratory management as above - Work with psych for assessment and management of possible catatonia - Consider MRI brain with/without contrast - If infectious work-up is  once again negative, consider further treatment for paroxysmal sympathetic hyperactivity

## 2021-12-11 NOTE — Progress Notes (Signed)
HD#10 Subjective:   Summary: Curby Carswell. is a 56 y.o. male with PMHx of PTSD, bipolar disorder, schizoaffective disorder, medication-induced tremors, polypharmacy, T2DM, and HTN who presented to the ED on 12/01/2021 with altered mental status secondary to polypharmacy.  Overnight Events: No acute events  Patient is showing a slight decline since yesterday.  He is still is speaking some but not as much as yesterday.  He is not able to indicate any new or worsening complaints.  Objective:  Vital signs in last 24 hours: Vitals:   12/10/21 2034 12/11/21 0421 12/11/21 0500 12/11/21 0525  BP: (!) 140/87 (!) 160/89  (!) 161/95  Pulse: 100     Resp: (!) 21 (!) 21  20  Temp: 97.9 F (36.6 C) 99.2 F (37.3 C)  99 F (37.2 C)  TempSrc: Axillary Axillary  Axillary  SpO2: 94% 95%  96%  Weight:   88.8 kg   Height:       Supplemental O2: Room Air SpO2: 96 % O2 Flow Rate (L/min): 1 L/min   Physical Exam:  Constitutional: Ill-appearing gentleman laying in bed HENT: normocephalic atraumatic, 2 x 2 centimeter sebaceous cyst just inferior to the left occiput with no surrounding edema or erythema and nontender to palpation Eyes: conjunctiva non-erythematous, pupils equal and reactive to light Pulmonary/Chest: Tachypneic MSK: normal bulk and tone Neurological: Awake and alert  Skin: warm and dry.  Well healing eschar over right elbow measuring about 1 cm x 2 cm   Filed Weights   12/02/21 0049 12/09/21 0336 12/11/21 0500  Weight: 91.9 kg 88.2 kg 88.8 kg     Intake/Output Summary (Last 24 hours) at 12/11/2021 0752 Last data filed at 12/11/2021 0003 Gross per 24 hour  Intake 4475 ml  Output 1100 ml  Net 3375 ml   Net IO Since Admission: 8,411.71 mL [12/11/21 0752]  Pertinent Labs:    Latest Ref Rng & Units 12/10/2021    4:00 AM 12/09/2021    3:44 AM 12/08/2021    6:19 AM  CBC  WBC 4.0 - 10.5 K/uL 16.1  23.8  11.7   Hemoglobin 13.0 - 17.0 g/dL 14.4  15.4  13.4    Hematocrit 39.0 - 52.0 % 42.3  45.8  39.3   Platelets 150 - 400 K/uL 274  214  206        Latest Ref Rng & Units 12/10/2021    4:00 AM 12/09/2021    1:37 PM 12/09/2021    3:44 AM  CMP  Glucose 70 - 99 mg/dL 222   236   BUN 6 - 20 mg/dL 57   44   Creatinine 0.61 - 1.24 mg/dL 1.71   1.90   Sodium 135 - 145 mmol/L 146   146   Potassium 3.5 - 5.1 mmol/L 4.6   4.8   Chloride 98 - 111 mmol/L 112   112   CO2 22 - 32 mmol/L 23   24   Calcium 8.9 - 10.3 mg/dL 9.9   9.6   Total Protein 6.5 - 8.1 g/dL 7.1  6.9    Total Bilirubin 0.3 - 1.2 mg/dL 0.6  0.3    Alkaline Phos 38 - 126 U/L 71  66    AST 15 - 41 U/L 36  30    ALT 0 - 44 U/L 23  23      Imaging: No results found.  Assessment/Plan:   Principal Problem:   Encephalopathy acute Active Problems:   Schizoaffective  disorder, bipolar type ([place])   Diabetic ketoacidosis without coma associated with type 2 diabetes mellitus ([place])   Thyroid nodule   Hypernatremia   Stage 2 skin ulcer of sacral region [facility])   Malnutrition of moderate degree   Patient Summary: [name]. is a 56 y.o. with a pertinent PMH of PTSD, bipolar disorder, schizoaffective disorder, medication-induced tremors, polypharmacy, who presented with confusion and admitted for acute encephalopathy and polypharmacy. He has had improvement in his mentation over the past few days with holding his anti-psychotics.    Toxic metabolic encephalopathy Fever Etiology likely polypharmacy with multiple centrally acting medications and possible withdrawal with sudden cessation of these.  He was showing steady improvement but developed a fever after showing a decrease in his mentation.  Fever peaked at 104.6 and lasted about 12 hours.  CT of the head showed a nonspecific 14 mm cutaneous/subcutaneous cystic-appearing lesion within the left upper posterior neck.  Lumbar puncture and CSF studies showed increased glucose and protein and red blood cells with a few lymphocytes  and PMNs but there was no white blood cells or organisms seen on Gram stain and cultures.  HSV and VZV CSF studies negative.  Bartonella antibodies pending.  CK rose back to 539 and repeat today was 199.  We have not noticed any new or worsening lymphadenopathy in the groin, subclavicular, or cervical chains.  This appears to be still a central acting process and less likely to be infectious cause of encephalopathy.  We will continue supportive measures and involve psychiatry see distention any persistent improvement with our current management. -continue ativan 2 mg qh6 - pending labs as above -Consult psych   Enteral nutrition  Malnutrition Hyperglycemia Hypernatremia Tolerating cortrak feedings well. Improvement in hyperglycemia with SSI q4h, scheduled q4h novolog, and semglee 25 U daily. With his severe illness, started IV PPI for ulcer prevention.  Increase insulin to 30 units daily.  Free water deficit of 2.3 L. -continue cortrak and Semglee 30 units with SSI -scheduled NovoLog 10 units q4h -Increase free water flush to 400 mL q4h -thiamine 100 mg daily   Acute kidney injury on CKD  Unclear baseline, could be around 1.5-2.  Has had improvement in Cr and GFR during hospitalization with IV fluids and resolution of his rhabdomyolysis and dehydration.  Creatinine 1.65 today. -daily cmp   Hypertension Elevated in setting of severe illness. Continue to monitor   Pressure injuries Patient with pressure injury to right elbow, right lower extremity and right sacrum. Continue with frequent rolling and management of wounds per nursing staff.    Diet: Tube Feeds IVF: None,None VTE: Enoxaparin Code: DNR PT/OT recs: Pending assessment   Dispo: Anticipated discharge pending further resolution of acute encephalopathy   [name] DO Internal Medicine Resident PGY-1 Pager: [phone]  Please contact the on call pager after 5 pm and on weekends at [phone].

## 2021-12-11 NOTE — Progress Notes (Addendum)
Checked on patient at 1500 and saw RT had just seen patient and placed patient on 4L Nasal Cannula, RR was in high 20's, SpO2 low 90's and HR in 110's. Notified IMTS who are otw to bedside, no new orders at this time.

## 2021-12-11 NOTE — Plan of Care (Signed)
  Problem: Nutritional: Goal: Maintenance of adequate nutrition will improve Outcome: Progressing   Problem: Skin Integrity: Goal: Risk for impaired skin integrity will decrease Outcome: Progressing   Problem: Tissue Perfusion: Goal: Adequacy of tissue perfusion will improve Outcome: Progressing   Problem: Clinical Measurements: Goal: Will remain free from infection Outcome: Progressing Goal: Diagnostic test results will improve Outcome: Progressing Goal: Cardiovascular complication will be avoided Outcome: Progressing   Problem: Activity: Goal: Risk for activity intolerance will decrease Outcome: Progressing   Problem: Coping: Goal: Level of anxiety will decrease Outcome: Progressing   Problem: Elimination: Goal: Will not experience complications related to bowel motility Outcome: Progressing Goal: Will not experience complications related to urinary retention Outcome: Progressing   Problem: Pain Managment: Goal: General experience of comfort will improve Outcome: Progressing   Problem: Safety: Goal: Ability to remain free from injury will improve Outcome: Progressing

## 2021-12-11 NOTE — Progress Notes (Signed)
CLINICAL UPDATE:  Received message from RN patient continues to be tachypneic, sating 88-90% on 5L. Evaluated patient at bedside. He is resting, appears diaphoretic, tachycardic, normotensive. Increased work of breathing with use of accessory muscles. Rhonchus breath sounds more so on R.   Chest x-ray earlier today unchanged from prior a few days ago. Patient is having increasing oxygen requirement over the last 24h, no clear cause. Has leukocytosis, although down from a few days ago. Per day team's note, thinking much of his presentation is due to a central process. I am concerned he might have an infection on top of this. Will obtain respiratory viral panel, procalcitonin. Without any new fevers, will hold off antibiotics and repeat cultures at this time.   - Follow-up respiratory viral panel - Follow-up procalcitonin  - Guaifenesin 52m every 4 hours  PSanjuan Dame MD Internal Medicine PGY-3 Pager: [phone]

## 2021-12-11 NOTE — Evaluation (Signed)
Physical Therapy Evaluation Patient Details Name: [name]. MRN: [id number] DOB: 07/06/1965 Today's Date: 12/11/2021  History of Present Illness  [name]. is a 56 y.o. male with PMHx of PTSD, bipolar disorder, schizoaffective disorder, medication-induced tremors, polypharmacy, T2DM, and HTN who presented to the ED on 12/01/2021 with altered mental status secondary to polypharmacy.   Clinical Impression  Per chart pt was living independently with intermittent visits from home health services and sister. Pt currently non-verbal, not following commands, non-responsive to noxious stimuli to UEs, and is only groaning and grimacing with passive ROM to bilat LEs, especially hip flexion. Pt very rigid in bilat UEs limiting elbow extension and shld flexion. Pt also with rigidity and stiffness t/o bilat LEs. Pt with no active/voluntary movement in bed and is totalAx2 for bed mobility. Pt's HR 110-113bpm at rest. Pts SPO2 87%-92% on RA with noted wet cough. At this time recommend SNF as pt is dependent for all mobility and ADLs and unsafe to return home alone. Acute PT to cont to follow to progress mobility as able and reassess d/c recommendations.       Recommendations for follow up therapy are one component of a multi-disciplinary discharge planning process, led by the attending physician.  Recommendations may be updated based on patient status, additional functional criteria and insurance authorization.  Follow Up Recommendations Skilled nursing-short term rehab (<3 hours/day) Can patient physically be transported by private vehicle: No    Assistance Recommended at Discharge Frequent or constant Supervision/Assistance  Patient can return home with the following  Other (comment) (pt dependent and non-verbal at this time with no active movement, unsafe to d/c home at all)    Equipment Recommendations  (TBD)  Recommendations for Other Services       Functional Status  Assessment Patient has had a recent decline in their functional status and/or demonstrates limited ability to make significant improvements in function in a reasonable and predictable amount of time     Precautions / Restrictions Precautions Precautions: Fall Precaution Comments: on cooling blanet Restrictions Weight Bearing Restrictions: No Other Position/Activity Restrictions: no      Mobility  Bed Mobility Overal bed mobility: Needs Assistance Bed Mobility: Rolling Rolling: Total assist, +2 for physical assistance         General bed mobility comments: totalAX2 to scoot to [facility], pt with initiation of movement, very rigid, dependent on RN tech and PT to complete rolling and transfer to [place] transfer comment: deferred as pt with no active movement, pt placed in chair position in the bed    Ambulation/Gait               General Gait Details: unable to attempt  Stairs            Wheelchair Mobility    Modified Rankin (Stroke Patients Only)       Balance Overall balance assessment:  (didn't assess as pt dependent in the bed)                                           Pertinent Vitals/Pain Pain Assessment Pain Assessment: Faces Faces Pain Scale: Hurts even more Pain Location: grimaced and groaned with bilat LE ROM at hip, knee and ankle, increased at  bilat hip flexion Pain Descriptors / Indicators: Moaning, Grimacing Pain Intervention(s): Monitored during session    Home Living Family/patient expects to be discharged to:: Unsure Living Arrangements: Alone                 Additional Comments: pt non-verbal today, only grimaced to ROM, per chart, pt lived alone, [facility] RN and aide to check on him t/o the week and then sister comes 1x/week to manage his meds    Prior Function Prior Level of Function : Needs assist             Mobility Comments: per chart pt amb with RW, had home  health aide and RN ADLs Comments: per chart sister set up his meds     Hand Dominance   Dominant Hand: Right    Extremity/Trunk Assessment   Upper Extremity Assessment Upper Extremity Assessment: RUE deficits/detail;LUE deficits/detail RUE Deficits / Details: no active movement, very rigid and stiff limiting shld, elbow, and hand ROM passively LUE Deficits / Details: no active movement, very rigid and stiff limiting shld, elbow, and hand ROM passively    Lower Extremity Assessment Lower Extremity Assessment: RLE deficits/detail;LLE deficits/detail (groaning with ROM, espeically with hip flexion) RLE Deficits / Details: no active movement, very rigid, grimacing with passive ROM at hip, knee, and ankle, very stiff LLE Deficits / Details: no active movement, very rigid, grimacing with passive ROM at hip, knee, and ankle, very stiff    Cervical / Trunk Assessment Cervical / Trunk Assessment: Other exceptions Cervical / Trunk Exceptions: pt holding head in L lateral flexion, with passive stretch able to get to midline but unable to maintain  Communication   Communication: Expressive difficulties (non-verbal)  Cognition Arousal/Alertness: Lethargic Behavior During Therapy: Flat affect Overall Cognitive Status: Impaired/Different from baseline Area of Impairment: Orientation, Attention, Following commands, Safety/judgement, Awareness, Problem solving                 Orientation Level: Disoriented to (unsure of orientation as pt with no verbal response or shaking of head to yes/no questions) Current Attention Level:  (no commands follow to tending to task)   Following Commands:  (no command follow at all)       General Comments: pt very flat, non verbal, no command follow. only groaned and opened eyes with bilat LE ROM        General Comments General comments (skin integrity, edema, etc.): HR 110-113bpm at rest, BP stable, SpO2 87-92% on RA    Exercises Other  Exercises Other Exercises: passive ROM at bilat shld, elbow (limited extension(, fingers, hips, knees, and ankles   Assessment/Plan    PT Assessment Patient needs continued PT services  PT Problem List Decreased strength;Decreased range of motion;Decreased activity tolerance;Decreased balance;Decreased coordination;Decreased mobility       PT Treatment Interventions DME instruction;Gait training;Functional mobility training;Therapeutic exercise;Therapeutic activities;Balance training;Neuromuscular re-education    PT Goals (Current goals can be found in the Care Plan section)  Acute Rehab PT Goals PT Goal Formulation: Patient unable to participate in goal setting Time For Goal Achievement: 12/25/21 Potential to Achieve Goals: Fair    Frequency Min 2X/week (will reassess once pt able to increase active participation)     Co-evaluation               AM-PAC PT "6 Clicks" Mobility  Outcome Measure Help needed turning from your back to your side while in a flat bed without using bedrails?: Total Help needed moving from lying on your  back to sitting on the side of a flat bed without using bedrails?: Total Help needed moving to and from a bed to a chair (including a wheelchair)?: Total Help needed standing up from a chair using your arms (e.g., wheelchair or bedside chair)?: Total Help needed to walk in hospital room?: Total Help needed climbing 3-5 steps with a railing? : Total 6 Click Score: 6    End of Session   Activity Tolerance: Patient limited by lethargy Patient left: in bed;with call bell/phone within reach;with bed alarm set;with nursing/sitter in room (in chair position) Nurse Communication: Mobility status PT Visit Diagnosis: Unsteadiness on feet (R26.81);Muscle weakness (generalized) (M62.81);Difficulty in walking, not elsewhere classified (R26.2)    Time: 4142-3953 PT Time Calculation (min) (ACUTE ONLY): 24 min   Charges:   PT Evaluation $PT Eval Moderate  Complexity: 1 Mod PT Treatments $Therapeutic Exercise: 8-22 mins        Kittie Plater, PT, DPT Acute Rehabilitation Services Secure chat preferred Office #: [phone]   Berline Lopes 12/11/2021, 10:27 AM

## 2021-12-11 NOTE — Progress Notes (Signed)
Went to administer patient's 1800 ativan, Patient SpO2 dropped to 88% readjusted his pulse ox and increased O2 to 5L, patient SpO2 increased to 90-91%, RR in mid 20's and increased work of breathing, but not agitated. Notified IMTS. Ativan given and continued monitoring patient for 15 minutes, no changes noted. IMTS otw to bedside to evaluate, no new orders at this time.

## 2021-12-11 NOTE — Progress Notes (Signed)
Cooling blanket turned off at 20:25, body temp 99.5

## 2021-12-11 NOTE — Progress Notes (Addendum)
Nutrition Follow-up  DOCUMENTATION CODES:   Non-severe (moderate) malnutrition in context of social or environmental circumstances  INTERVENTION:  Continue TF regimen via cortrak: Glucerna 1.5 @ 20m/h (16846md) Free water flush 40080m4h (per MD) This provides 2340kcal, 129g protein, and 2365m56mter (flush +TF)  NUTRITION DIAGNOSIS:  Moderate Malnutrition related to social / environmental circumstances (mental illness) as evidenced by mild muscle depletion, mild fat depletion.  GOAL:  Patient will meet greater than or equal to 90% of their needs  MONITOR:  Labs, Weight trends, Diet advancement  REASON FOR ASSESSMENT:  Consult Assessment of nutrition requirement/status  ASSESSMENT:  Pt with PMH of T2DM, CKD3, bipolar 1, schizoaffective disorder, substance use, and HTN presented to the ED after being found down at his home  8/18 - cortrak placed (pyloric region)  Pt resting in bed receiving chest PT at the time of assessment. Still does not answer questions today.   TF infusing at goal with good tolerance. Na elevated, MD increased free water today. Urine straw colored in canister.   Nutritionally Relevant Medications: Scheduled Meds:  folic acid  1 mg Intravenous Daily   free water  400 mL Per Tube Q4H   insulin aspart  0-9 Units Subcutaneous Q4H   insulin aspart  10 Units Subcutaneous Q4H   insulin glargine-yfgn  30 Units Subcutaneous Daily   pantoprazole (PROTONIX) IV  40 mg Intravenous Q24H   thiamine (VITAMIN B1) injection  100 mg Intravenous Daily   Continuous Infusions:  feeding supplement (GLUCERNA 1.5 CAL) 70 mL/hr at 12/11/21 1621   Labs Reviewed: Na 146, chloride 114 BUN 63, creatinine 1.65 CBG ranges from 110-237 mg/dL over the last 24 hours HgbA1c 8.0% (8/14)  NUTRITION - FOCUSED PHYSICAL EXAM:  Flowsheet Row Most Recent Value  Orbital Region Mild depletion  Upper Arm Region Mild depletion  Thoracic and Lumbar Region Mild depletion  Buccal  Region Mild depletion  Temple Region Moderate depletion  Clavicle Bone Region Mild depletion  Clavicle and Acromion Bone Region Mild depletion  Scapular Bone Region Mild depletion  Dorsal Hand No depletion  Patellar Region No depletion  Anterior Thigh Region No depletion  Posterior Calf Region Mild depletion  Edema (RD Assessment) None  Hair Reviewed  Eyes Reviewed  Mouth Reviewed  Skin Reviewed  Nails Reviewed   Diet Order:   Diet Order             Diet NPO time specified  Diet effective now                   EDUCATION NEEDS:  Not appropriate for education at this time  Skin:  Skin Assessment: Skin Integrity Issues: Skin Integrity Issues:: Stage II Stage II: right hip, right elbow, right sacrum  Last BM:  8/23 - type 5  Height:  Ht Readings from Last 1 Encounters:  12/02/21 '6\' 3"'$  (1.905 m)    Weight:  Wt Readings from Last 1 Encounters:  12/11/21 88.8 kg    Ideal Body Weight:  89.1 kg  BMI:  Body mass index is 24.47 kg/m.  Estimated Nutritional Needs:  Kcal:  2400-2600 kcal/d Protein:  120-140g/d Fluid:  2.4-2.6L/d    RachRanell Patrick, LDN Clinical Dietitian RD pager # available in [facility] hours/weekend pager # available in [facility]

## 2021-12-11 NOTE — Progress Notes (Signed)
Pharmacy Antibiotic Note  [name]. is a 56 y.o. male  with SO, possible HCAP/aspiration PNA.  Pharmacy has been consulted for Vancomycin and Zosyn  Plan: Vancomycin 1500 mg IV now, then 750 mg IV q12h Zosyn 3.375 g IV q8h   Height: '6\' 3"'$  (190.5 cm) Weight: 88.8 kg (195 lb 12.3 oz) IBW/kg (Calculated) : 84.5  Temp (24hrs), Avg:98.8 F (37.1 C), Min:98 F (36.7 C), Max:99.6 F (37.6 C)  Recent Labs  Lab 12/07/21 0414 12/08/21 0619 12/09/21 0344 12/10/21 0400 12/11/21 0809  WBC 8.7 11.7* 23.8* 16.1* 16.0*  CREATININE 1.59* 1.75* 1.90* 1.71* 1.65*    Estimated Creatinine Clearance: 61.9 mL/min (A) (by C-G formula based on SCr of 1.65 mg/dL (H)).    Allergies  Allergen Reactions   Vicodin [Hydrocodone-Acetaminophen] Itching     Caryl Pina 12/11/2021 11:55 PM

## 2021-12-12 DIAGNOSIS — G9340 Encephalopathy, unspecified: Secondary | ICD-10-CM | POA: Diagnosis not present

## 2021-12-12 DIAGNOSIS — F250 Schizoaffective disorder, bipolar type: Secondary | ICD-10-CM | POA: Diagnosis not present

## 2021-12-12 DIAGNOSIS — N179 Acute kidney failure, unspecified: Secondary | ICD-10-CM | POA: Diagnosis not present

## 2021-12-12 LAB — GLUCOSE, CAPILLARY
Glucose-Capillary: 196 mg/dL — ABNORMAL HIGH (ref 70–99)
Glucose-Capillary: 209 mg/dL — ABNORMAL HIGH (ref 70–99)
Glucose-Capillary: 259 mg/dL — ABNORMAL HIGH (ref 70–99)
Glucose-Capillary: 246 mg/dL — ABNORMAL HIGH (ref 70–99)
Glucose-Capillary: 250 mg/dL — ABNORMAL HIGH (ref 70–99)
Glucose-Capillary: 94 mg/dL (ref 70–99)
Glucose-Capillary: 135 mg/dL — ABNORMAL HIGH (ref 70–99)

## 2021-12-12 LAB — COMPREHENSIVE METABOLIC PANEL
ALT: 21 U/L (ref 0–44)
AST: 25 U/L (ref 15–41)
Albumin: 2.3 g/dL — ABNORMAL LOW (ref 3.5–5.0)
Alkaline Phosphatase: 58 U/L (ref 38–126)
Anion gap: 10 (ref 5–15)
BUN: 64 mg/dL — ABNORMAL HIGH (ref 6–20)
CO2: 20 mmol/L — ABNORMAL LOW (ref 22–32)
Calcium: 8.8 mg/dL — ABNORMAL LOW (ref 8.9–10.3)
Chloride: 113 mmol/L — ABNORMAL HIGH (ref 98–111)
Creatinine, Ser: 1.79 mg/dL — ABNORMAL HIGH (ref 0.61–1.24)
GFR, Estimated: 45 mL/min — ABNORMAL LOW (ref >60)
Glucose, Bld: 219 mg/dL — ABNORMAL HIGH (ref 70–99)
Potassium: 4.3 mmol/L (ref 3.5–5.1)
Sodium: 143 mmol/L (ref 135–145)
Total Bilirubin: 0.7 mg/dL (ref 0.3–1.2)
Total Protein: 6.2 g/dL — ABNORMAL LOW (ref 6.5–8.1)

## 2021-12-12 LAB — CBC
HCT: 37.8 % — ABNORMAL LOW (ref 39.0–52.0)
Hemoglobin: 12.3 g/dL — ABNORMAL LOW (ref 13.0–17.0)
MCH: 28.2 pg (ref 26.0–34.0)
MCHC: 32.5 g/dL (ref 30.0–36.0)
MCV: 86.7 fL (ref 80.0–100.0)
Platelets: 293 10*3/uL (ref 150–400)
RBC: 4.36 MIL/uL (ref 4.22–5.81)
RDW: 13.5 % (ref 11.5–15.5)
WBC: 12.5 10*3/uL — ABNORMAL HIGH (ref 4.0–10.5)
nRBC: 0.2 % (ref 0.0–0.2)

## 2021-12-12 MED ORDER — ORAL CARE MOUTH RINSE
15.0000 mL | OROMUCOSAL | Status: DC
  Administered 2021-12-12 – 2021-12-19 (×29): 15 mL via OROMUCOSAL

## 2021-12-12 MED ORDER — INSULIN ASPART 100 UNIT/ML IJ SOLN
8.0000 [IU] | INTRAMUSCULAR | Status: DC
Start: 2021-12-13 — End: 2021-12-13
  Administered 2021-12-13: 8 [IU] via SUBCUTANEOUS

## 2021-12-12 MED ORDER — LORAZEPAM 1 MG PO TABS
2.0000 mg | ORAL_TABLET | Freq: Four times a day (QID) | ORAL | Status: DC
  Administered 2021-12-12 – 2021-12-13 (×2): 2 mg via ORAL
  Filled 2021-12-12 (×2): qty 2

## 2021-12-12 MED ORDER — LORAZEPAM 2 MG/ML IJ SOLN
2.0000 mg | Freq: Once | INTRAMUSCULAR | Status: AC
  Administered 2021-12-12: 2 mg via INTRAVENOUS
  Filled 2021-12-12: qty 1

## 2021-12-12 MED ORDER — PANTOPRAZOLE 2 MG/ML SUSPENSION
40.0000 mg | Freq: Every day | ORAL | Status: DC
  Administered 2021-12-12 – 2021-12-18 (×7): 40 mg
  Filled 2021-12-12 (×7): qty 20

## 2021-12-12 MED ORDER — PIPERACILLIN-TAZOBACTAM 3.375 G IVPB
3.3750 g | Freq: Three times a day (TID) | INTRAVENOUS | Status: DC
  Administered 2021-12-12 – 2021-12-13 (×5): 3.375 g via INTRAVENOUS
  Filled 2021-12-12 (×5): qty 50

## 2021-12-12 MED ORDER — LORAZEPAM 2 MG/ML IJ SOLN
2.0000 mg | Freq: Once | INTRAMUSCULAR | Status: AC
Start: 2021-12-12 — End: 2021-12-12
  Administered 2021-12-12: 2 mg via INTRAVENOUS
  Filled 2021-12-12: qty 1

## 2021-12-12 MED ORDER — VANCOMYCIN HCL 750 MG/150ML IV SOLN
750.0000 mg | Freq: Two times a day (BID) | INTRAVENOUS | Status: DC
  Administered 2021-12-12 – 2021-12-13 (×3): 750 mg via INTRAVENOUS
  Filled 2021-12-12 (×3): qty 150

## 2021-12-12 MED ORDER — INSULIN ASPART 100 UNIT/ML IJ SOLN
13.0000 [IU] | INTRAMUSCULAR | Status: DC
Start: 2021-12-12 — End: 2021-12-12
  Administered 2021-12-12: 13 [IU] via SUBCUTANEOUS

## 2021-12-12 MED ORDER — VANCOMYCIN HCL 1500 MG/300ML IV SOLN
1500.0000 mg | Freq: Once | INTRAVENOUS | Status: AC
  Administered 2021-12-12: 1500 mg via INTRAVENOUS
  Filled 2021-12-12: qty 300

## 2021-12-12 NOTE — Progress Notes (Signed)
Speech Language Pathology Treatment: Dysphagia  Patient Details Name: [name]. MRN: [id number] DOB: 08/01/1965 Today's Date: 12/12/2021 Time: 1030-1050 SLP Time Calculation (min) (ACUTE ONLY): 20 min  Assessment / Plan / Recommendation Clinical Impression  Attempted to stimulate pt with repositioning, washing face, brushing teeth with a wet toothbrush. Pt moaned, but did not track, activate UE or follow any command. SLP touched ice and wet spoon to lips but pt did not respond; no licking lips or moving mouth to show sensation of moisture. This is very different than in prior sessions. One improvement is no audible wet upper respiratory sounds in contrast with earlier this week. Sister at bedside. Will continue efforts.   HPI HPI: Pt is a 56 year old male who presented 8/13 after he was found down at home. EEg 8/16: suggestive of moderate to severe diffuse encephalopathy, most likely secondary to toxic-metabolic causes. Psychotropic withdrawal suspected. CT head 8/16 and MRI brain 8/13 negative for acute changes. PMH: bipolar 1, PTSD, schizoaffective disorder, substance abuse, polypharmacy, DM2, CKD, hip osteoarthritis.      SLP Plan  Continue with current plan of care      Recommendations for follow up therapy are one component of a multi-disciplinary discharge planning process, led by the attending physician.  Recommendations may be updated based on patient status, additional functional criteria and insurance authorization.    Recommendations  Diet recommendations: NPO                Follow Up Recommendations: Skilled nursing-short term rehab (<3 hours/day) SLP Visit Diagnosis: Dysphagia, unspecified (R13.10) Plan: Continue with current plan of care           Sharni Negron, Katherene Ponto  12/12/2021, 10:58 AM

## 2021-12-12 NOTE — Consult Note (Signed)
[facility] New Face-to-Face Psychiatric Evaluation   Service Date: December 12, 2021 LOS:  LOS: 11 days    Assessment  [name]. is a 56 y.o. male admitted medically for 12/01/2021  4:50 PM for altered mental status after being found down, was found in pool of urine and feces after several days. 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  Psychiatry was consulted for possible catatonia by internal medicine primary team. Psych provided consult on 12/02/21, patient was unresponsive to all questions, had horizontal nystagmus - advised to hold all psychotropic medications given concern for polypharmacy  Initial presentation was concerning for benzodiazepine withdrawal given that he had been found down after several days and was known to have rx clonazepam, UDS in the ED was negative, klonipin does not always show up on UDS from ED. However, despite aggressive treatment and thorough workup, patient continued to remain minimally responsive. Per chart review there appears to have been some improvement over the course of hospitalization then subsequent decline. He has had recurrent fevers and desaturation with no identifiable cause, negative workup for stroke, bleeds, seizures, infectious causes during this admission with inconclusive LP, CSF findings consistent with inflammation. CT head notable for mild to moderate generalized cerebral atrophy that is more advanced than would be expected for patient's age. Discussed with primary team the possibility of malignant catatonia on 12/04/21, psychiatry recommended '2mg'$  ativan q6 which he has been  getting since then.  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 No real improvement seen by physician team after second dose (although some noted by nursing staff). On review of prior episodes of profound AMS, pt does seem to improve when restarted on antipsychotics; notably his home medications include aripiprazole (which is a D2 partial agonist; withdrawal from this agent has been known to precipitate catatonia, and Abilify is part of the algorithm for treatment resistant catatonia). Will need to monitor for any increase in rigidity and/or precipitation of NMS after we start this agent (less likely as not a D2 antagonist).   With negative medical workup so far, presentation strongly concerning for catatonia. Per Dr. Weber Cooks, patient is candidate for ECT. Will plan to discuss with sister [name] who is medical power of attorney r/b/se of ECT and determine best course with shared decisionmaking   Diagnoses:  Active Hospital problems: Principal Problem:   Encephalopathy acute Active Problems:   Schizoaffective disorder, bipolar type ([place])   Diabetic ketoacidosis without coma associated with type 2 diabetes mellitus (HCC)   Thyroid nodule   Hypernatremia   Stage 2 skin ulcer of sacral region ([place])   Malnutrition of moderate degree     Plan  ## Safety and Observation Level:  - Based on my clinical evaluation, I estimate the patient to be at low risk of self harm in the current setting - At this time, we recommend a routine level of observation. This decision is based on my review of the chart including patient's history and current presentation, interview of the patient, mental status examination, and consideration of suicide risk including evaluating suicidal ideation,  plan, intent, suicidal or self-harm behaviors, risk factors, and protective factors. This judgment is based on our  ability to directly address suicide risk, implement suicide prevention strategies and develop a safety plan while the patient is in the clinical setting. Please contact our team if there is a concern that risk level has changed.   ## Medications:  -- Continue '2mg'$  Ativan QID -- start aripiprazole 5 mg  ## Medical Decision Making Capacity:  -- Sister [name] is health care power of attorney  ## Work-up:  -- Pertinent labwork reviewed earlier this admission includes:  8/21: CT head w/o contrast:  -IMPRESSION: No evidence of acute intracranial abnormality. Mild-to-moderate generalized cerebral atrophy, advanced for age.  - 8/15 EEG:  ABNORMALITY - Periodic discharges with triphasic morphology, generalized ( GPDs) - Continuous slow, generalized   IMPRESSION: This study is suggestive of moderate diffuse encephalopathy, most likely secondary to toxic-metabolic causes.  No seizures were seen throughout the recording.   Thank you for this consult request. Recommendations have been communicated to the primary team.  We will continue to follow at this time.   Annia Belt, Medical Student   New history  Relevant Aspects of Hospital Course:  Admitted on 12/01/2021 for fall, altered mental status. Has had significant workup; tends to have days where he will moan and say 1-2 words but is largely non-communicative and non-interactive.    On exam today/24 Patient lying in bed, minimally responsive to voice/touch, responsive to pain. Did not speak to team.   Initial score of Sandria Senter: 10 with points for immobility, resistance to passive movement, mutism, decreased eye contact . Then administered '2mg'$  ativan, per nurse report patient was making more noise and moving his arms somewhat; did seem more interactive with team and less rigid. Gave another test dose of ativan without significant improvement.   Initial score of Sandria Senter: 10 with points for immobility, resistance to passive movement,  mutism, decreased eye contact.   ROS:  Unable to obtain due to unresponsive patient  Collateral information:  Sister [name] at bedside on initial evaluation. She states that patient used to live with her, and stayed with her for 14 years until 3 years ago when he got disability and started to live on his own. Since this time, she has been less involved in his med management. She states that a nurse aide usually helps him daily with meds. She states that he used to inform her when he would have AVH, but that she is not sure how his psychotic symptoms have been recently.   She describes that he has had similar episodes to this in the past but never this bad, she believes they were associated with his use of opioid medications.  Psychiatric History:  Information collected from chart review  - Schizoaffective d/o - Substance use d/o - Prior suicide attempt by multidrug overdose - memory problems - possible dementia, diagnosis has been given and questioned.   PTA psych medications listed include welllbutrin, abilify, buspirone, clonazepam, depakote, gabapentin, oxycodone, seroquel, tizanidine, trazadone. Record of barriers to accessing medication and periods of non-compliance.  Per chart review other episodes where he was "found down" with AMS resolved upon administration of home psychiatric med regimen.  Family psych history: unable to obtain.  Social History:  Per sister [name], patient has lived alone for 3 years. [name] had to leave, will plan to obtain more social and family history from her at a future time.   Family History:   The patient's family  history includes Cancer in his mother; Cervical cancer in his sister; Diabetes in his father and sister.  Medical History: Past Medical History:  Diagnosis Date   ADD (attention deficit disorder)    Anxiety    Arthritis    right hip   Bipolar 1 disorder (HCC)    Blood in urine    CKD (chronic kidney disease), stage III (HCC)    Creatinine  elevation    Dementia ([place])    "early onset" (08/04/2017)   Depression    bipolar [facility]   Diabetes mellitus without complication ([place])    Family history of anesthesia complication    pt is unsure , but pt father may have been difficult to arouse    HCAP (healthcare-associated pneumonia) 10/31/2012   History of kidney stones    Hypertension    Hypogonadism male    Liver fatty degeneration    Microscopic hematuria    hereditary s/p Urology eval   Neuromuscular disorder ([place])    feet neuropathy    Osteoarthritis of right hip 11/28/2011   2012 2015 s/p THR Severe  Dr Novella Olive     Pleural effusion 11/02/2012   Pneumonia 10-2012   Pneumonia, organism unspecified([place]) 11/02/2012   Polysubstance dependence, non-opioid, in remission ([place])    remote   Primary osteoarthritis of left hip 05/22/2015   PTSD (post-traumatic stress disorder)    SOCIAL ANXIETY DISORDER    Schizoaffective disorder ([place])    Substance abuse ([place])    Suicide attempt by multiple drug overdose ([place]) Jan 13, 2016   Grieving his cat's death 2015-07-21    Surgical History: Past Surgical History:  Procedure Laterality Date   BACK SURGERY     CLOSED REDUCTION METACARPAL WITH PERCUTANEOUS PINNING Right    LUMBAR [place]     TOTAL HIP ARTHROPLASTY Right 08/16/2013   Procedure: TOTAL HIP ARTHROPLASTY ANTERIOR APPROACH;  Surgeon: Hessie Dibble, MD;  Location: [facility];  Service: Orthopedics;  Laterality: Right;   TOTAL HIP ARTHROPLASTY Left 05/22/2015   Procedure: TOTAL HIP ARTHROPLASTY ANTERIOR APPROACH;  Surgeon: Melrose Nakayama, MD;  Location: [facility];  Service: Orthopedics;  Laterality: Left;    Medications:   Current Facility-Administered Medications:    acetaminophen (TYLENOL) 160 MG/5ML solution 650 mg, 650 mg, Per Tube, Q6H PRN, 650 mg at 12/12/21 0015 **OR** acetaminophen (TYLENOL) suppository 650 mg, 650 mg, Rectal, Q6H PRN, Heber [place], Jonathan C, DO   enoxaparin (LOVENOX) injection 40 mg, 40 mg,  Subcutaneous, Q24H, Hoffman, Jonathan C, DO, 40 mg at 12/11/21 1003   feeding supplement (GLUCERNA 1.5 CAL) liquid 1,000 mL, 1,000 mL, Per Tube, Continuous, Jonathan Groves, DO, Last Rate: 70 mL/hr at 12/12/21 0106, 1,000 mL at 17/61/60 7371   folic acid injection 1 mg, 1 mg, Intravenous, Daily, Jonathan Duff, MD, 1 mg at 12/11/21 1004   free water 400 mL, 400 mL, Per Tube, Q4H, Jonathan Blamer, DO, 400 mL at 12/12/21 0417   guaiFENesin (ROBITUSSIN) 100 MG/5ML liquid 15 mL, 15 mL, Per Tube, Q4H PRN, Jonathan Dame, MD, 15 mL at 12/12/21 0440   insulin aspart (novoLOG) injection 0-9 Units, 0-9 Units, Subcutaneous, Q4H, Jonathan Moulds, DO, 3 Units at 12/12/21 0417   insulin aspart (novoLOG) injection 10 Units, 10 Units, Subcutaneous, Q4H, Jonathan Blamer, DO, 10 Units at 12/12/21 0416   insulin glargine-yfgn (SEMGLEE) injection 30 Units, 30 Units, Subcutaneous, Daily, Jonathan Blonder, DO, 30 Units at 12/11/21 1109   ipratropium-albuterol (DUONEB) 0.5-2.5 (3) MG/3ML nebulizer solution  3 mL, 3 mL, Nebulization, Q4H PRN, [name], Sriramkumar, MD, 3 mL at 12/12/21 0439   LORazepam (ATIVAN) injection 2 mg, 2 mg, Intravenous, Q6H, [name], Vasilios, MD, 2 mg at 12/12/21 0505   Oral care mouth rinse, 15 mL, Mouth Rinse, PRN, Jonathan Groves, DO   Oral care mouth rinse, 15 mL, Mouth Rinse, 4 times per day, Jonathan Groves, DO   pantoprazole (PROTONIX) injection 40 mg, 40 mg, Intravenous, Q24H, [name], Vasilios, MD, 40 mg at 12/11/21 1140   piperacillin-tazobactam (ZOSYN) IVPB 3.375 g, 3.375 g, Intravenous, Q8H, Hoffman, Jonathan C, DO, Last Rate: 12.5 mL/hr at 12/12/21 0502, 3.375 g at 12/12/21 0502   thiamine (VITAMIN B1) injection 100 mg, 100 mg, Intravenous, Daily, Jonathan Duff, MD, 100 mg at 12/11/21 1004   vancomycin (VANCOREADY) IVPB 750 mg/150 mL, 750 mg, Intravenous, Q12H, Jonathan Groves, DO  Allergies: Allergies  Allergen Reactions   Vicodin [Hydrocodone-Acetaminophen] Itching        Objective  Vital signs:  Temp:  [98 F (36.7 Gray)-101.6 F (38.7 Gray)] 99.6 F (37.6 Gray) (08/24 0757) Pulse Rate:  [104-116] 114 (08/24 0805) Resp:  [18-29] 21 (08/24 0805) BP: (111-155)/(74-102) 122/77 (08/24 0400) SpO2:  [89 %-100 %] 91 % (08/24 0805) Weight:  [88 kg] 88 kg (08/24 0417)  Psychiatric Specialty Exam: unable to complete due to catatonic, unresponsive state  Physical Exam Constitutional:      Appearance: He is ill-appearing and diaphoretic.  HENT:     Head: Normocephalic and atraumatic.  Pulmonary:     Comments: Increased work of breathing Musculoskeletal:     Comments: Significant rigidity of UE bilaterally, mild rigidity of LE bilaterally  Skin:    General: Skin is warm.     Blood pressure 122/77, pulse (!) 114, temperature 99.6 F (37.6 Gray), temperature source Axillary, resp. rate (!) 21, height '6\' 3"'$  (1.905 m), weight 88 kg, SpO2 91 %. Body mass index is 24.25 kg/m.

## 2021-12-12 NOTE — Evaluation (Signed)
Occupational Therapy Evaluation Patient Details Name: [name]. MRN: [id number] DOB: 22-Mar-1966 Today's Date: 12/12/2021   History of Present Illness [name]. is a 56 y.o. male with PMHx of PTSD, bipolar disorder, schizoaffective disorder, medication-induced tremors, polypharmacy, T2DM, and HTN who presented to the ED on 12/01/2021 with altered mental status secondary to polypharmacy.   Clinical Impression   Per sister, PTA, pt lived alone and had a home health aid assist with ADL for 2 hours daily from Monday-Friday. Upon evaluation, pt presents with lethargy and decreased level of arousal. Pt nonverbal at this time, not following commands, and is grimacing/groaning with noxious stimuli. Frequent eye opening to verbal stimuli throughout session, but not locating, focusing, or tracking. Pt requiring total A +2 for rolling in bed and for posterior pericare this session. PROM to BUE to maintain ROM. Pt not observed with protective responses this session. Recommend discharge to SNF to optimize participation and safety in ADL. Will continue to follow acutely.      Recommendations for follow up therapy are one component of a multi-disciplinary discharge planning process, led by the attending physician.  Recommendations may be updated based on patient status, additional functional criteria and insurance authorization.   Follow Up Recommendations  Skilled nursing-short term rehab (<3 hours/day)    Assistance Recommended at Discharge Frequent or constant Supervision/Assistance  Patient can return home with the following Two people to help with walking and/or transfers;Two people to help with bathing/dressing/bathroom;Assistance with cooking/housework;Assistance with feeding;Direct supervision/assist for medications management;Direct supervision/assist for financial management;Assist for transportation;Help with stairs or ramp for entrance    Functional Status Assessment   Patient has had a recent decline in their functional status and/or demonstrates limited ability to make significant improvements in function in a reasonable and predictable amount of time  Equipment Recommendations  Other (comment) (defer)    Recommendations for Other Services       Precautions / Restrictions Precautions Precautions: Fall Precaution Comments: on cooling blanet Restrictions Weight Bearing Restrictions: No      Mobility Bed Mobility Overal bed mobility: Needs Assistance Bed Mobility: Rolling Rolling: Total assist, +2 for physical assistance         General bed mobility comments: Total A x2 for rolling, pt with no active participation for rolling or scooting toward [place]. Pt with groaning during rolling.    Transfers                   General transfer comment: Defer, as pt with no active movement      Balance Overall balance assessment:  (dependent in bed, not assessed)                                         ADL either performed or assessed with clinical judgement   ADL Overall ADL's : Needs assistance/impaired Eating/Feeding: NPO   Grooming: Total assistance;Bed level Grooming Details (indicate cue type and reason): Therapist providing total A to wash face Upper Body Bathing: Total assistance;Bed level   Lower Body Bathing: Total assistance;+2 for physical assistance;+2 for safety/equipment;Bed level   Upper Body Dressing : Total assistance;Bed level   Lower Body Dressing: Total assistance;+2 for physical assistance;+2 for safety/equipment;Bed level   Toilet Transfer: Total assistance;+2 for physical assistance;+2 for safety/equipment   Toileting- Clothing Manipulation and Hygiene: Total assistance;+2 for safety/equipment;+2 for physical assistance;Bed level Toileting - Clothing Manipulation Details (indicate  cue type and reason): Therapist providing total A for posterior pericare in bed       General ADL Comments: Pt  limited by level of arousal and lack of active movement     Vision Patient Visual Report:  Marcelene [place]) Vision Assessment?: Vision impaired- to be further tested in functional context Additional Comments: Eye alignment WFL this date. Pt not visually locating any items or people during session, but with frequent eye opening to verbal or noxious stim.     Perception     Praxis      Pertinent Vitals/Pain Pain Assessment Pain Assessment: Faces Faces Pain Scale: Hurts even more Pain Location: Grimacing and groaning with rolling in bed and with shoulder flexion to ~90 degrees Pain Descriptors / Indicators: Moaning, Grimacing Pain Intervention(s): Monitored during session     Hand Dominance Right   Extremity/Trunk Assessment Upper Extremity Assessment Upper Extremity Assessment: RUE deficits/detail;LUE deficits/detail RUE Deficits / Details: no active movement, very rigid and stiff limiting shld, ROM passively. PROM WFL at elbow, wrist, hand LUE Deficits / Details: no active movement, very rigid and stiff limiting shld, ROM passively. PROM WFL at elbow, wrist, hand   Lower Extremity Assessment Lower Extremity Assessment: Defer to PT evaluation   Cervical / Trunk Assessment Cervical / Trunk Assessment: Other exceptions Cervical / Trunk Exceptions: pt holding head in L lateral flexion, with passive stretch able to get to midline but unable to maintain. With support, pt head rotated laterally to L at ~15 degrees   Communication Communication Communication: Expressive difficulties (non-verbal)   Cognition Arousal/Alertness: Lethargic Behavior During Therapy: Flat affect Overall Cognitive Status: Impaired/Different from baseline Area of Impairment: Orientation, Attention, Following commands, Safety/judgement, Awareness, Problem solving                 Orientation Level: Disoriented to (Unsure; pt nonverbal and not responding with gestural approach) Current Attention Level:  (No  evidence of attention. Brief eye opening to verbal stim, not attending to task, and not locating therapist visually)   Following Commands:  (not following any commands this date) Safety/Judgement:  (no protective responses at this time)     General Comments: pt very flat, non verbal, no command follow. only groaned and opened eyes with bilat UE ROM. No active movement     General Comments  HR elevating up to 113 with rolling and RR elevating up to 33. SpO2 stable >92. RN aware and in room for majority of session.    Exercises Exercises: Other exercises Other Exercises Other Exercises: PROM bil shoulder, elbow, hand, wrist 10x   Shoulder Instructions      Home Living Family/patient expects to be discharged to:: Private residence Living Arrangements: Alone Available Help at Discharge: Family;Available 24 hours/day                             Additional Comments: Pt nonverbal today, sister present and reporting home health aid      Prior Functioning/Environment Prior Level of Function : Needs assist             Mobility Comments: Ambulated with RW ADLs Comments: sister sets up medication, home health aid assists with dressing and bathing. Comes for 2 hours daily M-F        OT Problem List: Decreased strength;Decreased range of motion;Decreased activity tolerance;Impaired balance (sitting and/or standing);Impaired vision/perception;Decreased coordination;Decreased cognition;Decreased safety awareness;Decreased knowledge of use of DME or AE;Pain      OT Treatment/Interventions:  Self-care/ADL training;Therapeutic exercise;Neuromuscular education;DME and/or AE instruction;Therapeutic activities;Cognitive remediation/compensation;Manual therapy;Visual/perceptual remediation/compensation;Patient/family education;Balance training    OT Goals(Current goals can be found in the care plan section) Acute Rehab OT Goals Patient Stated Goal: none stated. Sister says to get  better OT Goal Formulation: With patient/family Time For Goal Achievement: 12/26/21 Potential to Achieve Goals: Fair  OT Frequency: Min 2X/week    Co-evaluation              AM-PAC OT "6 Clicks" Daily Activity     Outcome Measure Help from another person eating meals?: Total Help from another person taking care of personal grooming?: Total Help from another person toileting, which includes using toliet, bedpan, or urinal?: Total Help from another person bathing (including washing, rinsing, drying)?: Total Help from another person to put on and taking off regular upper body clothing?: Total Help from another person to put on and taking off regular lower body clothing?: Total 6 Click Score: 6   End of Session Equipment Utilized During Treatment: Oxygen (5L/min) Nurse Communication: Mobility status  Activity Tolerance: Patient limited by lethargy Patient left: in bed;with call bell/phone within reach;with bed alarm set;with family/visitor present  OT Visit Diagnosis: Unsteadiness on feet (R26.81);Other abnormalities of gait and mobility (R26.89);Muscle weakness (generalized) (M62.81);Low vision, both eyes (H54.2);Other symptoms and signs involving cognitive function;Pain Pain - part of body:  (Pt unable to state)                Time: 3016-0109 OT Time Calculation (min): 31 min Charges:  OT General Charges $OT Visit: 1 Visit OT Evaluation $OT Eval Moderate Complexity: 1 Mod OT Treatments $Therapeutic Exercise: 8-22 mins Shanda Howells, OTR/L [facility] Acute Rehabilitation Office: [phone]   Lula Olszewski 12/12/2021, 12:50 PM

## 2021-12-12 NOTE — Plan of Care (Signed)
  Problem: Fluid Volume: Goal: Ability to maintain a balanced intake and output will improve Outcome: Progressing   Problem: Nutritional: Goal: Maintenance of adequate nutrition will improve Outcome: Progressing   Problem: Tissue Perfusion: Goal: Adequacy of tissue perfusion will improve Outcome: Progressing   Problem: Clinical Measurements: Goal: Ability to maintain clinical measurements within normal limits will improve Outcome: Progressing Goal: Diagnostic test results will improve Outcome: Progressing Goal: Respiratory complications will improve Outcome: Progressing Goal: Cardiovascular complication will be avoided Outcome: Progressing   Problem: Nutrition: Goal: Adequate nutrition will be maintained Outcome: Progressing   Problem: Coping: Goal: Level of anxiety will decrease Outcome: Progressing   Problem: Elimination: Goal: Will not experience complications related to bowel motility Outcome: Progressing Goal: Will not experience complications related to urinary retention Outcome: Progressing   Problem: Pain Managment: Goal: General experience of comfort will improve Outcome: Progressing   Problem: Safety: Goal: Ability to remain free from injury will improve Outcome: Progressing

## 2021-12-12 NOTE — Progress Notes (Signed)
HD#11 Subjective:   Summary: [name]. is a 56 y.o. male with PMHx of PTSD, bipolar disorder, schizoaffective disorder, medication-induced tremors, polypharmacy, T2DM, and HTN who presented to the ED on 12/01/2021 with altered mental status secondary to polypharmacy.  Overnight Events: Yesterday evening patient continued to be tachypneic and was satting in the 88-90 range.  At the time he appeared diaphoretic and had increased work of breathing with accessory muscle use along with rhonchus breath sounds right greater than left.  He was requiring supplemental oxygen and we obtained a respiratory viral panel and procalcitonin, but deferred antibiotics and blood cultures since he was afebrile at that time.  Later in the evening he was continued to have desaturations and continued tachypnea.  Overnight team assessed him with largely unchanged physical exam.  At that time he was febrile so they took blood cultures and started him on Vanco and Zosyn for coverage due to his high risk for aspiration pneumonia.  Also started DuoNebs.  Today patient is overall looking less tachypneic.  He is not working hard to breathe and is more alert.  He is able to track with his eyes and his sister in the room says that he has been able to say a few things.  Objective:  Vital signs in last 24 hours: Vitals:   12/12/21 0417 12/12/21 0757 12/12/21 0805 12/12/21 1210  BP:      Pulse:  (!) 108 (!) 114 (!) 109  Resp:   (!) 21 (!) 21  Temp:  99.6 F (37.6 C)  99.7 F (37.6 C)  TempSrc:  Axillary  Axillary  SpO2:  93% 91%   Weight: 88 kg     Height:       Supplemental O2: Nasal Cannula SpO2: 91 % O2 Flow Rate (L/min): 5 L/min   Physical Exam:  Constitutional: Ill-appearing gentleman laying in bed HENT: normocephalic atraumatic, core track NG feeding tube in place Eyes: conjunctiva non-erythematous, pupils equal but dilated and reactive to light Pulmonary/Chest: Tachypneic MSK: normal bulk  and tone Neurological: Awake and alert  Skin: warm and dry with multiple healing small scratch wounds on his legs  Filed Weights   12/09/21 0336 12/11/21 0500 12/12/21 0417  Weight: 88.2 kg 88.8 kg 88 kg     Intake/Output Summary (Last 24 hours) at 12/12/2021 1343 Last data filed at 12/12/2021 0953 Gross per 24 hour  Intake 4567.81 ml  Output 1600 ml  Net 2967.81 ml   Net IO Since Admission: 11,250.02 mL [12/12/21 1343]  Pertinent Labs:    Latest Ref Rng & Units 12/12/2021    3:14 AM 12/11/2021    8:09 AM 12/10/2021    4:00 AM  CBC  WBC 4.0 - 10.5 K/uL 12.5  16.0  16.1   Hemoglobin 13.0 - 17.0 g/dL 12.3  14.1  14.4   Hematocrit 39.0 - 52.0 % 37.8  40.9  42.3   Platelets 150 - 400 K/uL 293  314  274        Latest Ref Rng & Units 12/12/2021    3:14 AM 12/11/2021    8:09 AM 12/10/2021    4:00 AM  CMP  Glucose 70 - 99 mg/dL 219  160  222   BUN 6 - 20 mg/dL 64  63  57   Creatinine 0.61 - 1.24 mg/dL 1.79  1.65  1.71   Sodium 135 - 145 mmol/L 143  146  146   Potassium 3.5 - 5.1 mmol/L 4.3  4.5  4.6   Chloride 98 - 111 mmol/L 113  114  112   CO2 22 - 32 mmol/L '20  22  23   '$ Calcium 8.9 - 10.3 mg/dL 8.8  9.4  9.9   Total Protein 6.5 - 8.1 g/dL 6.2  6.8  7.1   Total Bilirubin 0.3 - 1.2 mg/dL 0.7  0.4  0.6   Alkaline Phos 38 - 126 U/L 58  74  71   AST 15 - 41 U/L 25  32  36   ALT 0 - 44 U/L '21  25  23     '$ Imaging: DG CHEST PORT 1 VIEW  Result Date: 12/11/2021 CLINICAL DATA:  40981; tachypnea EXAM: PORTABLE CHEST 1 VIEW COMPARISON:  December 08, 2021 FINDINGS: Again seen is the feeding tube which is traced up to the proximal body of the stomach. Distal end of the tube is not included in the image. Low lung volumes. There is some atelectasis seen at the left lung base without significant interval change. No consolidation, pleural effusion or vascular congestion. The visualized skeletal structures are unremarkable. IMPRESSION: Minor atelectasis at the left lung base, stable. Feeding  tube appears stable. Electronically Signed   By: Frazier Richards M.D.   On: 12/11/2021 16:36    Assessment/Plan:   Principal Problem:   Encephalopathy acute Active Problems:   Schizoaffective disorder, bipolar type ([place])   Diabetic ketoacidosis without coma associated with type 2 diabetes mellitus (HCC)   Thyroid nodule   Hypernatremia   Stage 2 skin ulcer of sacral region [facility])   Malnutrition of moderate degree   Patient Summary: [name]. is a 56 y.o. with a pertinent PMH of PTSD, bipolar disorder, schizoaffective disorder, medication-induced tremors, polypharmacy, who presented with confusion and admitted for acute encephalopathy and polypharmacy. He has had improvement in his mentation over the past few days with holding his anti-psychotics.    Toxic metabolic encephalopathy Fever Likely etiology continues to be polypharmacy with multiple centrally acting medications and possible withdrawal with sudden cessation of these.  He was showing steady improvement but developed a fever after showing a decrease in his mentation.  Work-up including CT of the head, chest x-ray, lumbar puncture, and Bartonella antibodies were reassuring.  His CK did initially rise when he spiked his fever but has been down within normal limits since then.  With his recent increased tachypnea and oxygen desaturations he is on supplemental oxygen through his nose.  However he primarily breathes through his mouth so the rate may not indicate his true supplemental oxygen need.  As we wait for the new blood cultures we will continue Vanco and Zosyn.  Overall this appears to be still a central acting process and less likely to be infectious cause of encephalopathy.  We have consulted psychiatry who will see the patient today and we appreciate their recommendations. -continue ativan 2 mg qh6 - pending blood cultures, continue Vanco and Zosyn -Consult psych   Enteral nutrition   Malnutrition Hyperglycemia Hypernatremia Tolerating cortrak feedings well. Improvement in hyperglycemia with SSI q4h, scheduled q4h novolog, and semglee 30 U daily.  Glucoses have been ranging between 200-250.  Free water deficit of 1.1 L. -continue cortrak and Semglee 30 units with SSI - Increase scheduled NovoLog to 13 units units q4h -Continue free water flush 400 mL q4h -thiamine 100 mg daily   Acute kidney injury on CKD Unclear baseline, could be around 1.5-2.  Has had improvement in Cr and GFR during hospitalization  with IV fluids and resolution of his rhabdomyolysis and dehydration.  Creatinine 1.79 today. -daily cmp   Hypertension-improving Elevated in setting of severe illness.  Stable blood pressures with majority of readings normotensive.  We will continue to monitor   Pressure injuries Patient with pressure injury to right elbow, right lower extremity and right sacrum. Continue with frequent rolling and management of wounds per nursing staff.    Diet: Tube Feeds IVF: None,None VTE: Enoxaparin Code: DNR PT/OT recs: Pending assessment   Dispo: Anticipated discharge pending further resolution of acute encephalopathy   [name] DO Internal Medicine Resident PGY-1 Pager: [phone]  Please contact the on call pager after 5 pm and on weekends at [phone].

## 2021-12-13 DIAGNOSIS — G928 Other toxic encephalopathy: Secondary | ICD-10-CM | POA: Diagnosis not present

## 2021-12-13 DIAGNOSIS — J9601 Acute respiratory failure with hypoxia: Secondary | ICD-10-CM | POA: Diagnosis not present

## 2021-12-13 DIAGNOSIS — E041 Nontoxic single thyroid nodule: Secondary | ICD-10-CM

## 2021-12-13 DIAGNOSIS — F250 Schizoaffective disorder, bipolar type: Secondary | ICD-10-CM | POA: Diagnosis not present

## 2021-12-13 DIAGNOSIS — E1122 Type 2 diabetes mellitus with diabetic chronic kidney disease: Secondary | ICD-10-CM | POA: Diagnosis not present

## 2021-12-13 DIAGNOSIS — G9340 Encephalopathy, unspecified: Secondary | ICD-10-CM | POA: Diagnosis not present

## 2021-12-13 LAB — CBC
HCT: 32.2 % — ABNORMAL LOW (ref 39.0–52.0)
Hemoglobin: 10.6 g/dL — ABNORMAL LOW (ref 13.0–17.0)
MCH: 29 pg (ref 26.0–34.0)
MCHC: 32.9 g/dL (ref 30.0–36.0)
MCV: 88 fL (ref 80.0–100.0)
Platelets: 259 10*3/uL (ref 150–400)
RBC: 3.66 MIL/uL — ABNORMAL LOW (ref 4.22–5.81)
RDW: 13.9 % (ref 11.5–15.5)
WBC: 11.9 10*3/uL — ABNORMAL HIGH (ref 4.0–10.5)
nRBC: 0.2 % (ref 0.0–0.2)

## 2021-12-13 LAB — MAGNESIUM: Magnesium: 2.3 mg/dL (ref 1.7–2.4)

## 2021-12-13 LAB — ROCKY MTN SPOTTED FVR ABS PNL(IGG+IGM)
RMSF IgG: NEGATIVE
RMSF IgM: 1.26 index — ABNORMAL HIGH (ref 0.00–0.89)

## 2021-12-13 LAB — COMPREHENSIVE METABOLIC PANEL
ALT: 19 U/L (ref 0–44)
AST: 24 U/L (ref 15–41)
Albumin: 2 g/dL — ABNORMAL LOW (ref 3.5–5.0)
Alkaline Phosphatase: 59 U/L (ref 38–126)
Anion gap: 8 (ref 5–15)
BUN: 64 mg/dL — ABNORMAL HIGH (ref 6–20)
CO2: 21 mmol/L — ABNORMAL LOW (ref 22–32)
Calcium: 8.5 mg/dL — ABNORMAL LOW (ref 8.9–10.3)
Chloride: 113 mmol/L — ABNORMAL HIGH (ref 98–111)
Creatinine, Ser: 1.89 mg/dL — ABNORMAL HIGH (ref 0.61–1.24)
GFR, Estimated: 42 mL/min — ABNORMAL LOW (ref >60)
Glucose, Bld: 261 mg/dL — ABNORMAL HIGH (ref 70–99)
Potassium: 4.3 mmol/L (ref 3.5–5.1)
Sodium: 142 mmol/L (ref 135–145)
Total Bilirubin: 0.6 mg/dL (ref 0.3–1.2)
Total Protein: 6.1 g/dL — ABNORMAL LOW (ref 6.5–8.1)

## 2021-12-13 LAB — GLUCOSE, CAPILLARY
Glucose-Capillary: 225 mg/dL — ABNORMAL HIGH (ref 70–99)
Glucose-Capillary: 201 mg/dL — ABNORMAL HIGH (ref 70–99)
Glucose-Capillary: 211 mg/dL — ABNORMAL HIGH (ref 70–99)
Glucose-Capillary: 147 mg/dL — ABNORMAL HIGH (ref 70–99)
Glucose-Capillary: 147 mg/dL — ABNORMAL HIGH (ref 70–99)

## 2021-12-13 LAB — CULTURE, BLOOD (ROUTINE X 2)
Culture: NO GROWTH
Culture: NO GROWTH

## 2021-12-13 LAB — CSF CULTURE W GRAM STAIN
Culture: NO GROWTH
Gram Stain: NONE SEEN

## 2021-12-13 LAB — PHOSPHORUS: Phosphorus: 4.6 mg/dL (ref 2.5–4.6)

## 2021-12-13 MED ORDER — INSULIN GLARGINE-YFGN 100 UNIT/ML ~~LOC~~ SOLN
35.0000 [IU] | Freq: Every day | SUBCUTANEOUS | Status: DC
  Administered 2021-12-13 – 2021-12-19 (×7): 35 [IU] via SUBCUTANEOUS
  Filled 2021-12-13 (×8): qty 0.35

## 2021-12-13 MED ORDER — INSULIN ASPART 100 UNIT/ML IJ SOLN
9.0000 [IU] | INTRAMUSCULAR | Status: DC
Start: 2021-12-13 — End: 2021-12-14
  Administered 2021-12-13 – 2021-12-14 (×3): 9 [IU] via SUBCUTANEOUS

## 2021-12-13 MED ORDER — INSULIN ASPART 100 UNIT/ML IJ SOLN
9.0000 [IU] | Freq: Once | INTRAMUSCULAR | Status: AC
Start: 2021-12-13 — End: 2021-12-13
  Administered 2021-12-13: 9 [IU] via SUBCUTANEOUS

## 2021-12-13 MED ORDER — INSULIN ASPART 100 UNIT/ML IJ SOLN: 10.0000 [IU] | INTRAMUSCULAR | Status: DC

## 2021-12-13 MED ORDER — LORAZEPAM 1 MG PO TABS
2.0000 mg | ORAL_TABLET | Freq: Four times a day (QID) | ORAL | Status: DC
  Administered 2021-12-13 – 2021-12-17 (×18): 2 mg
  Filled 2021-12-13 (×18): qty 2

## 2021-12-13 MED ORDER — ARIPIPRAZOLE 10 MG PO TABS
5.0000 mg | ORAL_TABLET | Freq: Every day | ORAL | Status: DC
  Administered 2021-12-13: 5 mg via ORAL
  Filled 2021-12-13: qty 1

## 2021-12-13 MED ORDER — ARIPIPRAZOLE 10 MG PO TABS
5.0000 mg | ORAL_TABLET | Freq: Every day | ORAL | Status: DC
Start: 2021-12-14 — End: 2021-12-18
  Administered 2021-12-14 – 2021-12-18 (×5): 5 mg
  Filled 2021-12-13 (×5): qty 1

## 2021-12-13 MED ORDER — INSULIN ASPART 100 UNIT/ML IJ SOLN: 9.0000 [IU] | Freq: Once | INTRAMUSCULAR | Status: DC

## 2021-12-13 NOTE — Consult Note (Signed)
[name] New Face-to-Face Psychiatric Evaluation   Service Date: December 13, 2021 LOS:  LOS: 12 days    Assessment  [name]. is a 56 y.o. male admitted medically for 12/01/2021  4:50 PM for altered mental status after being found down, was found in pool of urine and feces after several days. 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  Psychiatry was consulted for possible catatonia by internal medicine primary team. Psych provided consult on 12/02/21, patient was unresponsive to all questions, had horizontal nystagmus - advised to hold all psychotropic medications given concern for polypharmacy  Initial presentation was concerning for benzodiazepine withdrawal given that he had been found down after several days and was known to have rx clonazepam, UDS in the ED was negative, klonipin does not always show up on UDS from ED. However, despite aggressive treatment and thorough workup, patient continued to remain minimally responsive. Per chart review there appears to have been some improvement over the course of hospitalization then subsequent decline. He has had recurrent fevers and desaturation with no identifiable cause, negative workup for stroke, bleeds, seizures, infectious causes during this admission with inconclusive LP, CSF findings consistent with inflammation. CT head notable for mild to moderate generalized cerebral atrophy that is more advanced than would be expected for patient's age. Discussed with primary team the possibility of malignant catatonia on 12/04/21, psychiatry recommended '2mg'$  ativan q6 which he has been  getting since then.  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 No real improvement seen by physician team after second dose (although some noted by nursing staff). On review of prior episodes of profound AMS, pt does seem to improve when restarted on antipsychotics; notably his home medications include aripiprazole (which is a D2 partial agonist; withdrawal from this agent has been known to precipitate catatonia, and Abilify is part of the algorithm for treatment resistant catatonia). Will need to monitor for any increase in rigidity and/or precipitation of NMS after we start this agent (less likely as not a D2 antagonist).   With negative medical workup so far, presentation strongly concerning for catatonia. Per Dr. Weber Gray, patient is candidate for ECT. Will plan to discuss with sister [name] who is medical power of attorney r/b/se of ECT and determine best course with shared decisionmaking   8/25: marginal improvement. Continue current meds. Reached out to sister (below) and Dr. Weber Gray. Possibly needs transfer [facility] medical bed at [facility], which would be accomplished by primary team (not stable for inpt psychiatry)  Diagnoses:  Active Hospital problems: Principal Problem:   Encephalopathy acute Active Problems:   Schizoaffective disorder, bipolar type ([place])   Type 2 diabetes mellitus with stage 4 chronic kidney disease, with long-term current use of insulin (HCC)   Thyroid nodule   Hypernatremia   Stage 2 skin ulcer of sacral region (HCC)   Malnutrition of moderate degree     Plan  ## Safety and Observation Level:  - Based on my clinical evaluation, I estimate the patient to be at low risk of self harm in the current setting - At  this time, we recommend a routine level of observation. This decision is based on my review of the chart including patient's history and current  presentation, interview of the patient, mental status examination, and consideration of suicide risk including evaluating suicidal ideation, plan, intent, suicidal or self-harm behaviors, risk factors, and protective factors. This judgment is based on our ability to directly address suicide risk, implement suicide prevention strategies and develop a safety plan while the patient is in the clinical setting. Please contact our team if there is a concern that risk level has changed.   ## Medications:  -- Continue '2mg'$  Ativan QID -- continue aripiprazole 5 mg  ## Medical Decision Making Capacity:  -- Sister [name] is health care power of attorney (have not personally reviewed documentation -- pt functionally lacks capacity to make own decisions at this time as he is nonverbal 2/2 psychiatric decompensation. Will need to be periodically reassessed.   ## Work-up:  -- Pertinent labwork reviewed earlier this admission includes:  8/21: CT head w/o contrast:  -IMPRESSION: No evidence of acute intracranial abnormality. Mild-to-moderate generalized cerebral atrophy, advanced for age.  - 8/15 EEG:  ABNORMALITY - Periodic discharges with triphasic morphology, generalized ( GPDs) - Continuous slow, generalized   IMPRESSION: This study is suggestive of moderate diffuse encephalopathy, most likely secondary to toxic-metabolic causes.  No seizures were seen throughout the recording.   Thank you for this consult request. Recommendations have been communicated to the primary team.  We will continue to follow at this time.   [name]   New history  Relevant Aspects of Hospital Course:  Admitted on 12/01/2021 for fall, altered mental status. Has had significant workup; tends to have days where he will moan and say 1-2 words but is largely non-communicative and non-interactive. There has been some marginal improvement over the past few days.    On exam today/25 Patient lying in bed - responded  to voice (tracked examiner through interview) so did not reassess responsiveness to pain. Overall improved on exam. Moving legs spontaneously. Some rigidity in LUE, although not worse than prior to ativan administration yesterday. Attempted to communicate (groaned) but did not form words.   ROS:  Unable to obtain due to unresponsive patient  Collateral information:  Called sister. Had ~10-15 minute conversation with her on current diagnosis, current treatment, and potential for ECT. Described the procedure and risks and benefits briefly; explained it essentially as a higher risk procedure that is more likely to have benefit for her brother at this time lead to improvement in psychiatric symptoms. She feels that given recent trajectory, she would like to pursue this option. Formal informed consent will need to be done by physician performing the procedure, ideally after POA papers located.   She describes that he has had similar episodes to this in the past but never this bad, she believes they were associated with his use of opioid medications.  Psychiatric History:  Information collected from chart review  - Schizoaffective d/o - Substance use d/o - Prior suicide attempt by multidrug overdose - memory problems - possible dementia, diagnosis has been given and questioned.   PTA psych medications listed include welllbutrin, abilify, buspirone, clonazepam, depakote, gabapentin, oxycodone, seroquel, tizanidine, trazadone. Record of barriers to accessing medication and periods of non-compliance.  Per chart review other episodes where he was "found down" with AMS resolved upon administration of home psychiatric med regimen.  Family psych history: unable to obtain.  Social History:  Per sister [name], patient has  lived alone for 3 years. [name] had to leave, will plan to obtain more social and family history from her at a future time.   Family History:   The patient's family history includes Cancer  in his mother; Cervical cancer in his sister; Diabetes in his father and sister.  Medical History: Past Medical History:  Diagnosis Date   ADD (attention deficit disorder)    Anxiety    Arthritis    right hip   Bipolar 1 disorder (HCC)    Blood in urine    CKD (chronic kidney disease), stage III (HCC)    Creatinine elevation    Dementia ([place])    "early onset" (08/04/2017)   Depression    bipolar [facility]   Diabetes mellitus without complication ([place])    Family history of anesthesia complication    pt is unsure , but pt father may have been difficult to arouse    HCAP (healthcare-associated pneumonia) 10/31/2012   History of kidney stones    Hypertension    Hypogonadism male    Liver fatty degeneration    Microscopic hematuria    hereditary s/p Urology eval   Neuromuscular disorder ([place])    feet neuropathy    Osteoarthritis of right hip 11/28/2011   2012 2015 s/p THR Severe  Dr Novella Olive     Pleural effusion 11/02/2012   Pneumonia 10-2012   Pneumonia, organism unspecified([place]) 11/02/2012   Polysubstance dependence, non-opioid, in remission ([place])    remote   Primary osteoarthritis of left hip 05/22/2015   PTSD (post-traumatic stress disorder)    SOCIAL ANXIETY DISORDER    Schizoaffective disorder ([place])    Substance abuse ([place])    Suicide attempt by multiple drug overdose ([place]) 01-10-16   Grieving his cat's death Jul 18, 2015    Surgical History: Past Surgical History:  Procedure Laterality Date   BACK SURGERY     CLOSED REDUCTION METACARPAL WITH PERCUTANEOUS PINNING Right    LUMBAR [place]     TOTAL HIP ARTHROPLASTY Right 08/16/2013   Procedure: TOTAL HIP ARTHROPLASTY ANTERIOR APPROACH;  Surgeon: Hessie Dibble, MD;  Location: [place];  Service: Orthopedics;  Laterality: Right;   TOTAL HIP ARTHROPLASTY Left 05/22/2015   Procedure: TOTAL HIP ARTHROPLASTY ANTERIOR APPROACH;  Surgeon: Melrose Nakayama, MD;  Location: [place];  Service: Orthopedics;   Laterality: Left;    Medications:   Current Facility-Administered Medications:    acetaminophen (TYLENOL) 160 MG/5ML solution 650 mg, 650 mg, Per Tube, Q6H PRN, 650 mg at 12/12/21 0015 **OR** acetaminophen (TYLENOL) suppository 650 mg, 650 mg, Rectal, Q6H PRN, Lucious Groves, DO   [START ON 12/14/2021] ARIPiprazole (ABILIFY) tablet 5 mg, 5 mg, Per Tube, Daily, Hoffman, Erik C, DO   enoxaparin (LOVENOX) injection 40 mg, 40 mg, Subcutaneous, Q24H, Hoffman, Erik C, DO, 40 mg at 12/13/21 1027   feeding supplement (GLUCERNA 1.5 CAL) liquid 1,000 mL, 1,000 mL, Per Tube, Continuous, Lucious Groves, DO, Last Rate: 70 mL/hr at 12/12/21 18-Jul-2010, 1,000 mL at 43/32/95 1884   folic acid injection 1 mg, 1 mg, Intravenous, Daily, Rick Duff, MD, 1 mg at 12/12/21 1002   free water 400 mL, 400 mL, Per Tube, Q4H, [name], DO, 400 mL at 12/13/21 1028   guaiFENesin (ROBITUSSIN) 100 MG/5ML liquid 15 mL, 15 mL, Per Tube, Q4H PRN, Sanjuan Dame, MD, 15 mL at 12/12/21 2128-07-17   insulin aspart (novoLOG) injection 0-9 Units, 0-9 Units, Subcutaneous, Q4H, Hoffman, Erik C, DO, 3 Units  at 12/13/21 0401   insulin aspart (novoLOG) injection 9 Units, 9 Units, Subcutaneous, Q4H, Gaylan Gerold, DO, 9 Units at 12/13/21 1048   insulin glargine-yfgn (SEMGLEE) injection 35 Units, 35 Units, Subcutaneous, Daily, Gaylan Gerold, DO, 35 Units at 12/13/21 1025   ipratropium-albuterol (DUONEB) 0.5-2.5 (3) MG/3ML nebulizer solution 3 mL, 3 mL, Nebulization, Q4H PRN, Jodell Cipro, Sriramkumar, MD, 3 mL at 12/12/21 0439   LORazepam (ATIVAN) tablet 2 mg, 2 mg, Per Tube, QID, Lucious Groves, DO   Oral care mouth rinse, 15 mL, Mouth Rinse, PRN, Lucious Groves, DO   Oral care mouth rinse, 15 mL, Mouth Rinse, 4 times per day, Joni Reining C, DO, 15 mL at 12/13/21 1028   pantoprazole (PROTONIX) 2 mg/mL oral suspension 40 mg, 40 mg, Per Tube, Daily, [name], DO, 40 mg at 12/13/21 1027   thiamine (VITAMIN B1) injection 100 mg, 100  mg, Intravenous, Daily, Rick Duff, MD, 100 mg at 12/13/21 1027  Allergies: Allergies  Allergen Reactions   Vicodin [Hydrocodone-Acetaminophen] Itching       Objective  Vital signs:  Temp:  [98.1 F (36.7 C)-99.7 F (37.6 C)] 98.1 F (36.7 C) (08/25 0803) Pulse Rate:  [89-109] 90 (08/25 0803) Resp:  [14-34] 14 (08/25 0803) BP: (114-141)/(59-85) 117/79 (08/25 0803) SpO2:  [92 %-95 %] 94 % (08/25 0803) Weight:  [89.9 kg] 89.9 kg (08/25 0500)  Psychiatric Specialty Exam: unable to complete due to catatonic state. A little more responsive today as in notes.   Physical Exam Constitutional:      Appearance: He is ill-appearing.  HENT:     Head: Normocephalic and atraumatic.  Pulmonary:     Comments: Increased work of breathing Musculoskeletal:     Comments: Some rigidity of LUE>>>RUE  Skin:    General: Skin is warm.     Blood pressure 117/79, pulse 90, temperature 98.1 F (36.7 C), temperature source Axillary, resp. rate 14, height '6\' 3"'$  (1.905 m), weight 89.9 kg, SpO2 94 %. Body mass index is 24.77 kg/m.

## 2021-12-13 NOTE — Progress Notes (Signed)
HD#12 Subjective:  Overnight Events: no event  Patient is seen at bedside.  He appears comfortable and in no acute distress.  Patient is alert and mostly nonverbal.  He was able to say yes when asked if he was uncomfortable.  He did not specify why he is uncomfortable.  Objective:  Vital signs in last 24 hours: Vitals:   12/13/21 0352 12/13/21 0500 12/13/21 0747 12/13/21 0803  BP: 121/66   117/79  Pulse:   89 90  Resp: 20   14  Temp: 98.5 F (36.9 C)   98.1 F (36.7 C)  TempSrc: Axillary   Axillary  SpO2: 94%   94%  Weight:  89.9 kg    Height:       Supplemental O2: [place] SpO2: 94 % O2 Flow Rate (L/min): 2 L/min   Physical Exam:  Physical Exam Constitutional:      General: He is not in acute distress.    Comments: Mostly nonverbal  HENT:     Head: Normocephalic.  Eyes:     General:        Right eye: No discharge.        Left eye: No discharge.     Conjunctiva/sclera: Conjunctivae normal.  Cardiovascular:     Rate and Rhythm: Normal rate and regular rhythm.     Heart sounds: Normal heart sounds.  Pulmonary:     Effort: Pulmonary effort is normal.     Comments: Anterior lungs sound clear Abdominal:     General: Bowel sounds are normal. There is no distension.     Palpations: Abdomen is soft.     Tenderness: There is no abdominal tenderness.  Musculoskeletal:     Right lower leg: No edema.     Left lower leg: No edema.  Skin:    General: Skin is warm.  Neurological:     Mental Status: He is alert.     Filed Weights   12/11/21 0500 12/12/21 0417 12/13/21 0500  Weight: 88.8 kg 88 kg 89.9 kg     Intake/Output Summary (Last 24 hours) at 12/13/2021 1010 Last data filed at 12/13/2021 0527 Gross per 24 hour  Intake 689.95 ml  Output 2100 ml  Net -1410.05 ml   Net IO Since Admission: 9,839.97 mL [12/13/21 1010]  Pertinent Labs:    Latest Ref Rng & Units 12/13/2021    4:22 AM 12/12/2021    3:14 AM 12/11/2021    8:09 AM  CBC  WBC 4.0 - 10.5  K/uL 11.9  12.5  16.0   Hemoglobin 13.0 - 17.0 g/dL 10.6  12.3  14.1   Hematocrit 39.0 - 52.0 % 32.2  37.8  40.9   Platelets 150 - 400 K/uL 259  293  314        Latest Ref Rng & Units 12/13/2021    4:22 AM 12/12/2021    3:14 AM 12/11/2021    8:09 AM  CMP  Glucose 70 - 99 mg/dL 261  219  160   BUN 6 - 20 mg/dL 64  64  63   Creatinine 0.61 - 1.24 mg/dL 1.89  1.79  1.65   Sodium 135 - 145 mmol/L 142  143  146   Potassium 3.5 - 5.1 mmol/L 4.3  4.3  4.5   Chloride 98 - 111 mmol/L 113  113  114   CO2 22 - 32 mmol/L '21  20  22   '$ Calcium 8.9 - 10.3 mg/dL 8.5  8.8  9.4  Total Protein 6.5 - 8.1 g/dL 6.1  6.2  6.8   Total Bilirubin 0.3 - 1.2 mg/dL 0.6  0.7  0.4   Alkaline Phos 38 - 126 U/L 59  58  74   AST 15 - 41 U/L 24  25  32   ALT 0 - 44 U/L '19  21  25     '$ Imaging: No results found.  Assessment/Plan:   Principal Problem:   Encephalopathy acute Active Problems:   Schizoaffective disorder, bipolar type (HCC)   Type 2 diabetes mellitus with stage 4 chronic kidney disease, with long-term current use of insulin (HCC)   Thyroid nodule   Hypernatremia   Stage 2 skin ulcer of sacral region [facility])   Malnutrition of moderate degree   Patient Summary: Arliss Frisina. is a 56 y.o. with a pertinent PMH of PTSD, bipolar disorder, schizoaffective disorder, medication-induced tremors, polypharmacy, who presented with confusion and admitted for acute encephalopathy likely secondary to to polypharmacy and catatonia.    Toxic metabolic encephalopathy Hyperthermia Acute hypoxic respiratory failure Likely 2/2 polypharmacy + withdrawal + catatonia.  Psychiatry is suspecting catatonia from his antipsychotics.  Per note, he may had some improvement after IV Ativan yesterday.  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  If he does not respond, ECT is the next step which psychiatry team will discuss with [place]. There is no clear picture of sepsis.  No clear source of  infection.  Blood culture, respiratory panel and CSF negative to date. Abd exam benign. WBC trending down. Hyperthermia, increased work of breathing and tachycardia can be explained by catatonia.  -Appreciate psychiatry recommendation -Continue PO Ativan 2 mg q6h -Start Abilify 5 mg daily -Discontinue vancomycin and Zosyn  -Pending RMSF and Ehrlichia, even though less likely   Enteral nutrition  Malnutrition Type 2 diabetes with hyperglycemia (A1C 8%) Tolerating cortrak feedings well.  Sodium normalizes.  CBG still above goal.  -Increase Semglee 35 units with SSI -Increase NovoLog to 9 units q4h -Continue free water flush 400 mL q4h   Acute kidney injury on CKD Unclear baseline, could be around 1.5-2.  Creatinine slightly elevated to 1.89, could be normal fluctuation. -Daily BMP  Normocytic anemia Suspect hemodilution with starting tube feed -Daily CBC  Pressure injuries Patient with pressure injury to right elbow, right lower extremity and right sacrum. Continue with frequent rolling and management of wounds per nursing staff.    Thyroid nodule 1.5 cm incidental thyroid nodule found on CT scan. -Outpatient thyroid ultrasound recommended  Diet: Tube Feeds IVF:  free water , VTE: Enoxaparin Code: DNR PT/OT recs: SNF for Subacute PT,  TOC recs: pending    Dispo: Anticipated discharge to Skilled nursing facility pending improvement of mental status  Gaylan Gerold, DO 12/13/2021, 10:10 AM Pager: [phone]  Please contact the on call pager after 5 pm and on weekends at [phone].

## 2021-12-13 NOTE — Progress Notes (Signed)
PT Cancellation Note  Patient Details Name: [name]. MRN: [id number] DOB: January 27, 1966   Cancelled Treatment:    Reason Eval/Treat Not Completed: Other (comment) Pt currently getting washed up by NT. Will follow.   Marguarite Arbour A [name] 12/13/2021, 12:50 PM Marisa Severin, PT, DPT Acute Rehabilitation Services Secure chat preferred Office [phone]

## 2021-12-14 LAB — CBC
HCT: 31 % — ABNORMAL LOW (ref 39.0–52.0)
Hemoglobin: 10.4 g/dL — ABNORMAL LOW (ref 13.0–17.0)
MCH: 29.2 pg (ref 26.0–34.0)
MCHC: 33.5 g/dL (ref 30.0–36.0)
MCV: 87.1 fL (ref 80.0–100.0)
Platelets: 289 10*3/uL (ref 150–400)
RBC: 3.56 MIL/uL — ABNORMAL LOW (ref 4.22–5.81)
RDW: 13.6 % (ref 11.5–15.5)
WBC: 8.4 10*3/uL (ref 4.0–10.5)
nRBC: 0.5 % — ABNORMAL HIGH (ref 0.0–0.2)

## 2021-12-14 LAB — GLUCOSE, CAPILLARY
Glucose-Capillary: 109 mg/dL — ABNORMAL HIGH (ref 70–99)
Glucose-Capillary: 112 mg/dL — ABNORMAL HIGH (ref 70–99)
Glucose-Capillary: 120 mg/dL — ABNORMAL HIGH (ref 70–99)
Glucose-Capillary: 132 mg/dL — ABNORMAL HIGH (ref 70–99)
Glucose-Capillary: 148 mg/dL — ABNORMAL HIGH (ref 70–99)
Glucose-Capillary: 175 mg/dL — ABNORMAL HIGH (ref 70–99)
Glucose-Capillary: 182 mg/dL — ABNORMAL HIGH (ref 70–99)

## 2021-12-14 LAB — COMPREHENSIVE METABOLIC PANEL
ALT: 22 U/L (ref 0–44)
AST: 27 U/L (ref 15–41)
Albumin: 2.1 g/dL — ABNORMAL LOW (ref 3.5–5.0)
Alkaline Phosphatase: 62 U/L (ref 38–126)
Anion gap: 8 (ref 5–15)
BUN: 50 mg/dL — ABNORMAL HIGH (ref 6–20)
CO2: 22 mmol/L (ref 22–32)
Calcium: 8.9 mg/dL (ref 8.9–10.3)
Chloride: 110 mmol/L (ref 98–111)
Creatinine, Ser: 1.6 mg/dL — ABNORMAL HIGH (ref 0.61–1.24)
GFR, Estimated: 51 mL/min — ABNORMAL LOW (ref >60)
Glucose, Bld: 130 mg/dL — ABNORMAL HIGH (ref 70–99)
Potassium: 4 mmol/L (ref 3.5–5.1)
Sodium: 140 mmol/L (ref 135–145)
Total Bilirubin: 0.4 mg/dL (ref 0.3–1.2)
Total Protein: 6.1 g/dL — ABNORMAL LOW (ref 6.5–8.1)

## 2021-12-14 MED ORDER — DOXYCYCLINE HYCLATE 100 MG PO TABS
100.0000 mg | ORAL_TABLET | Freq: Two times a day (BID) | ORAL | Status: DC
  Administered 2021-12-14 – 2021-12-17 (×6): 100 mg
  Filled 2021-12-14 (×6): qty 1

## 2021-12-14 MED ORDER — INSULIN ASPART 100 UNIT/ML IJ SOLN
8.0000 [IU] | INTRAMUSCULAR | Status: DC
Start: 2021-12-14 — End: 2021-12-17
  Administered 2021-12-14 – 2021-12-17 (×22): 8 [IU] via SUBCUTANEOUS

## 2021-12-14 NOTE — Progress Notes (Signed)
HD#13 Subjective:  Overnight Events: No acute events overnight   Patient resting in bed comfortably. Able to mouth some words, he is not in pain. He is unsure as to why he is here. Redirected and updated.   Objective:  Vital signs in last 24 hours: Vitals:   12/14/21 0800 12/14/21 1100 12/14/21 1132 12/14/21 1555  BP: 137/75 117/80 (!) 144/81 (!) 156/62  Pulse: 96 96 98 97  Resp: 19 (!) '22 18 19  '$ Temp: (!) 101.5 F (38.6 C) 99 F (37.2 C) 100.1 F (37.8 C) 99.8 F (37.7 C)  TempSrc: Axillary Axillary Axillary Axillary  SpO2: 94% 96% 95% 91%  Weight:      Height:       Supplemental O2: [place] SpO2: 91 % O2 Flow Rate (L/min): 1 L/min   Physical Exam:  Constitutional: ill appearing HENT: normocephalic atraumatic Eyes: conjunctiva non-erythematous. Improved mydriasis   Neck: supple Cardiovascular: regular rate and rhythm, no m/r/g Pulmonary/Chest: normal work of breathing on room air, lungs clear to auscultation bilaterally Abdominal: soft, non-tender, non-distended MSK: Rigid extremities.  Neurological: alert & oriented, mostly non-verbal Skin: warm and dry Psych: flat affect  Filed Weights   12/12/21 0417 12/13/21 0500 12/14/21 0441  Weight: 88 kg 89.9 kg 89.3 kg     Intake/Output Summary (Last 24 hours) at 12/14/2021 1709 Last data filed at 12/14/2021 1500 Gross per 24 hour  Intake --  Output 2050 ml  Net -2050 ml   Net IO Since Admission: 7,293.13 mL [12/14/21 1709]  Pertinent Labs:    Latest Ref Rng & Units 12/14/2021    3:27 AM 12/13/2021    4:22 AM 12/12/2021    3:14 AM  CBC  WBC 4.0 - 10.5 K/uL 8.4  11.9  12.5   Hemoglobin 13.0 - 17.0 g/dL 10.4  10.6  12.3   Hematocrit 39.0 - 52.0 % 31.0  32.2  37.8   Platelets 150 - 400 K/uL 289  259  293        Latest Ref Rng & Units 12/14/2021    3:27 AM 12/13/2021    4:22 AM 12/12/2021    3:14 AM  CMP  Glucose 70 - 99 mg/dL 130  261  219   BUN 6 - 20 mg/dL 50  64  64   Creatinine 0.61 - 1.24 mg/dL 1.60   1.89  1.79   Sodium 135 - 145 mmol/L 140  142  143   Potassium 3.5 - 5.1 mmol/L 4.0  4.3  4.3   Chloride 98 - 111 mmol/L 110  113  113   CO2 22 - 32 mmol/L '22  21  20   '$ Calcium 8.9 - 10.3 mg/dL 8.9  8.5  8.8   Total Protein 6.5 - 8.1 g/dL 6.1  6.1  6.2   Total Bilirubin 0.3 - 1.2 mg/dL 0.4  0.6  0.7   Alkaline Phos 38 - 126 U/L 62  59  58   AST 15 - 41 U/L '27  24  25   '$ ALT 0 - 44 U/L '22  19  21     '$ Imaging: No results found.  Assessment/Plan:   Principal Problem:   Encephalopathy acute Active Problems:   Schizoaffective disorder, bipolar type (HCC)   Type 2 diabetes mellitus with stage 4 chronic kidney disease, with long-term current use of insulin (HCC)   Thyroid nodule   Hypernatremia   Stage 2 skin ulcer of sacral region [facility])   Malnutrition of moderate degree  Patient Summary: [name]. is a 56 y.o. with a pertinent PMH of PTSD, bipolar disorder, schizoaffective disorder, medication-induced tremors, polypharmacy, who presented with confusion and admitted for acute encephalopathy likely secondary to to polypharmacy and catatonia.     Toxic metabolic encephalopathy Hyperthermia Acute hypoxic respiratory failure Likely 2/2 polypharmacy + withdrawal + catatonia.  Psychiatry is suspecting catatonia from his antipsychotics.  Improving with ativan and restarting abilify. Does have some upper extremity rigidity but improvement in his mydriasis. Possible candidate for ECT Hyperthermia, increased work of breathing and tachycardia can be explained by catatonia.  -Appreciate psychiatry recommendation -Continue PO Ativan 2 mg q6h -continue Abilify 5 mg daily  Rocky mountain spotted fever IgM + Patient presented with distal extremity petechial rash, fever, and confusion. The rash resolved with initial antibiotics. With IgM being positive at 1.26 will treat with 5 days of doxy BID. Unclear if what led to his initial presentation, but will continue treatment for catatonia  above.    Enteral nutrition  Malnutrition Type 2 diabetes with hyperglycemia (A1C 8%) Tolerating cortrak feedings well.  Sodium normalizes. If am glucose still elevated tomorrow, will increase semglee to 40 units with SSI -Continue Semglee 35 units with SSI -Continue NovoLog to 9 units q4h -Continue free water flush 400 mL q4h   Acute kidney injury on CKD Unclear baseline, could be around 1.5-2. Continue to monitor daily.  -Daily BMP   Normocytic anemia Suspect hemodilution with starting tube feed -Daily CBC   Pressure injuries Patient with pressure injury to right elbow, right lower extremity and right sacrum. Continue with frequent rolling and management of wounds per nursing staff.    Thyroid nodule 1.5 cm incidental thyroid nodule found on CT scan. -Outpatient thyroid ultrasound recommended   Diet: Tube Feeds IVF:  free water , VTE: Enoxaparin Code: DNR PT/OT recs: SNF for Subacute PT,  TOC recs: pending   Dispo: Anticipated discharge to Skilled nursing facility pending further evaluation  [place] Internal Medicine Resident PGY-3 Please contact the on call pager after 5 pm and on weekends at [phone].

## 2021-12-14 NOTE — Progress Notes (Signed)
NTS per RN request. Pt coughed but no return.

## 2021-12-15 ENCOUNTER — Inpatient Hospital Stay (HOSPITAL_COMMUNITY): Payer: Medicare Other

## 2021-12-15 DIAGNOSIS — E46 Unspecified protein-calorie malnutrition: Secondary | ICD-10-CM | POA: Diagnosis not present

## 2021-12-15 DIAGNOSIS — J9601 Acute respiratory failure with hypoxia: Secondary | ICD-10-CM | POA: Diagnosis not present

## 2021-12-15 DIAGNOSIS — J690 Pneumonitis due to inhalation of food and vomit: Secondary | ICD-10-CM

## 2021-12-15 DIAGNOSIS — G9340 Encephalopathy, unspecified: Secondary | ICD-10-CM | POA: Diagnosis not present

## 2021-12-15 DIAGNOSIS — J189 Pneumonia, unspecified organism: Secondary | ICD-10-CM

## 2021-12-15 DIAGNOSIS — D649 Anemia, unspecified: Secondary | ICD-10-CM

## 2021-12-15 DIAGNOSIS — A770 Spotted fever due to Rickettsia rickettsii: Secondary | ICD-10-CM

## 2021-12-15 LAB — CBC WITH DIFFERENTIAL/PLATELET
Abs Immature Granulocytes: 0.09 10*3/uL — ABNORMAL HIGH (ref 0.00–0.07)
Basophils Absolute: 0 10*3/uL (ref 0.0–0.1)
Basophils Relative: 1 %
Eosinophils Absolute: 0.1 10*3/uL (ref 0.0–0.5)
Eosinophils Relative: 1 %
HCT: 31.8 % — ABNORMAL LOW (ref 39.0–52.0)
Hemoglobin: 10.5 g/dL — ABNORMAL LOW (ref 13.0–17.0)
Immature Granulocytes: 2 %
Lymphocytes Relative: 22 %
Lymphs Abs: 1.3 10*3/uL (ref 0.7–4.0)
MCH: 28.4 pg (ref 26.0–34.0)
MCHC: 33 g/dL (ref 30.0–36.0)
MCV: 85.9 fL (ref 80.0–100.0)
Monocytes Absolute: 0.5 10*3/uL (ref 0.1–1.0)
Monocytes Relative: 7 %
Neutro Abs: 4.2 10*3/uL (ref 1.7–7.7)
Neutrophils Relative %: 67 %
Platelets: 303 10*3/uL (ref 150–400)
RBC: 3.7 MIL/uL — ABNORMAL LOW (ref 4.22–5.81)
RDW: 12.9 % (ref 11.5–15.5)
WBC: 6.2 10*3/uL (ref 4.0–10.5)
nRBC: 0.5 % — ABNORMAL HIGH (ref 0.0–0.2)

## 2021-12-15 LAB — COMPREHENSIVE METABOLIC PANEL
ALT: 24 U/L (ref 0–44)
AST: 30 U/L (ref 15–41)
Albumin: 2.2 g/dL — ABNORMAL LOW (ref 3.5–5.0)
Alkaline Phosphatase: 60 U/L (ref 38–126)
Anion gap: 10 (ref 5–15)
BUN: 40 mg/dL — ABNORMAL HIGH (ref 6–20)
CO2: 21 mmol/L — ABNORMAL LOW (ref 22–32)
Calcium: 9.1 mg/dL (ref 8.9–10.3)
Chloride: 109 mmol/L (ref 98–111)
Creatinine, Ser: 1.49 mg/dL — ABNORMAL HIGH (ref 0.61–1.24)
GFR, Estimated: 55 mL/min — ABNORMAL LOW (ref >60)
Glucose, Bld: 158 mg/dL — ABNORMAL HIGH (ref 70–99)
Potassium: 4.6 mmol/L (ref 3.5–5.1)
Sodium: 140 mmol/L (ref 135–145)
Total Bilirubin: 0.5 mg/dL (ref 0.3–1.2)
Total Protein: 6.4 g/dL — ABNORMAL LOW (ref 6.5–8.1)

## 2021-12-15 LAB — GLUCOSE, CAPILLARY
Glucose-Capillary: 159 mg/dL — ABNORMAL HIGH (ref 70–99)
Glucose-Capillary: 158 mg/dL — ABNORMAL HIGH (ref 70–99)
Glucose-Capillary: 199 mg/dL — ABNORMAL HIGH (ref 70–99)
Glucose-Capillary: 189 mg/dL — ABNORMAL HIGH (ref 70–99)
Glucose-Capillary: 123 mg/dL — ABNORMAL HIGH (ref 70–99)
Glucose-Capillary: 162 mg/dL — ABNORMAL HIGH (ref 70–99)

## 2021-12-15 MED ORDER — PIPERACILLIN-TAZOBACTAM 3.375 G IVPB
3.3750 g | Freq: Three times a day (TID) | INTRAVENOUS | Status: DC
  Administered 2021-12-15 – 2021-12-20 (×14): 3.375 g via INTRAVENOUS
  Filled 2021-12-15 (×14): qty 50

## 2021-12-15 MED ORDER — PIPERACILLIN-TAZOBACTAM 4.5 G IVPB
4.5000 g | Freq: Four times a day (QID) | INTRAVENOUS | Status: DC
Start: 2021-12-15 — End: 2021-12-15

## 2021-12-15 NOTE — Progress Notes (Signed)
HD#14 Subjective:  Overnight Events: Course breath sounds noted by staff. Evaluated by Dr. Alfonse Spruce and found to have left sided opacity suspicious for aspiration vs pneumonia.   Patient resting in bed comfortably, sister [name] at bedside. She notes he has been more interactive and talkative today. Discussed findings of positive IgM RMSF and chest xray with evidence of pneumonia. She feels he has improved over the past few days, but is still unaware of what occurred.   Objective:  Vital signs in last 24 hours: Vitals:   12/15/21 0500 12/15/21 0839 12/15/21 1224 12/15/21 1602  BP:  138/82 127/76 135/89  Pulse:  89 87 88  Resp:  '14 18 14  '$ Temp:  98.4 F (36.9 C) 99 F (37.2 C) 98.7 F (37.1 C)  TempSrc:  Axillary Axillary Axillary  SpO2:  94% 96% 98%  Weight: 87.2 kg     Height:       Supplemental O2: [place] SpO2: 98 % O2 Flow Rate (L/min): 2 L/min   Physical Exam:  Constitutional: ill appearing HENT: normocephalic atraumatic Eyes: conjunctiva non-erythematous. Mydriasis improved.   Neck: supple Cardiovascular: regular rate and rhythm, no m/r/g Pulmonary/Chest: normal work of breathing on 2L. Course breath sounds anteriorly.  Abdominal: soft, non-tender, non-distended MSK: Initial resistance to flexion that resolved Neurological: alert & oriented, able to speak a few words.  Skin: warm and dry. Well healing abrasions of lower extremities. No pressure wounds to posterior calcaneous.  Psych: flat affect  Filed Weights   12/13/21 0500 12/14/21 0441 12/15/21 0500  Weight: 89.9 kg 89.3 kg 87.2 kg     Intake/Output Summary (Last 24 hours) at 12/15/2021 1737 Last data filed at 12/15/2021 1625 Gross per 24 hour  Intake 203.5 ml  Output 1200 ml  Net -996.5 ml   Net IO Since Admission: 6,296.63 mL [12/15/21 1737]  Pertinent Labs:    Latest Ref Rng & Units 12/15/2021    6:59 AM 12/14/2021    3:27 AM 12/13/2021    4:22 AM  CBC  WBC 4.0 - 10.5 K/uL 6.2  8.4  11.9   Hemoglobin  13.0 - 17.0 g/dL 10.5  10.4  10.6   Hematocrit 39.0 - 52.0 % 31.8  31.0  32.2   Platelets 150 - 400 K/uL 303  289  259        Latest Ref Rng & Units 12/15/2021    6:59 AM 12/14/2021    3:27 AM 12/13/2021    4:22 AM  CMP  Glucose 70 - 99 mg/dL 158  130  261   BUN 6 - 20 mg/dL 40  50  64   Creatinine 0.61 - 1.24 mg/dL 1.49  1.60  1.89   Sodium 135 - 145 mmol/L 140  140  142   Potassium 3.5 - 5.1 mmol/L 4.6  4.0  4.3   Chloride 98 - 111 mmol/L 109  110  113   CO2 22 - 32 mmol/L '21  22  21   '$ Calcium 8.9 - 10.3 mg/dL 9.1  8.9  8.5   Total Protein 6.5 - 8.1 g/dL 6.4  6.1  6.1   Total Bilirubin 0.3 - 1.2 mg/dL 0.5  0.4  0.6   Alkaline Phos 38 - 126 U/L 60  62  59   AST 15 - 41 U/L '30  27  24   '$ ALT 0 - 44 U/L '24  22  19     '$ Imaging: DG CHEST PORT 1 VIEW  Result Date: 12/15/2021  CLINICAL DATA:  [id number].  Shortness of breath. EXAM: PORTABLE CHEST 1 VIEW COMPARISON:  12/11/2021. FINDINGS: 5:27 a.m. Feeding tube enters the stomach but the intragastric course is not filmed. There is a low inspiration, as before but today there is increased patchy consolidation in the left lower lung field compatible with pneumonia or aspiration. The remaining hypoexpanded lungs are generally clear. No pleural effusion is seen. The cardiomediastinal silhouette is normal. There are multilevel spinal bridging syndesmophytes. IMPRESSION: Increased opacity in the left lower lung field consistent with pneumonia or aspiration. Otherwise stable hypoinflated exam. Electronically Signed   By: Telford Nab M.D.   On: 12/15/2021 07:36    Assessment/Plan:   Principal Problem:   Encephalopathy acute Active Problems:   Schizoaffective disorder, bipolar type ([place])   Type 2 diabetes mellitus with stage 4 chronic kidney disease, with long-term current use of insulin (HCC)   Hypernatremia   Stage 2 skin ulcer of sacral region [facility])   Malnutrition of moderate degree   Aspiration pneumonia ([place])   [facility] spotted  fever  Patient Summary: [name]. is a 56 y.o. with a pertinent PMH of PTSD, bipolar disorder, schizoaffective disorder, medication-induced tremors, polypharmacy, who presented with confusion and admitted for acute encephalopathy likely secondary to to polypharmacy and catatonia.     Toxic metabolic encephalopathy Hyperthermia Acute hypoxic respiratory failure Likely 2/2 polypharmacy with catatonia. Improvement in mentation today. Psychiatry following and appreciate their assistance. Per sister, psychiatry plan to have patient transferred to [facility] for ECT.  -Appreciate psychiatry recommendation, possible transfer to [place] for ECT -Continue PO Ativan 2 mg q6h -continue Abilify 5 mg daily  Rocky mountain spotted fever IgM + Patient presented with distal extremity petechial rash, fever, and confusion. The rash resolved with initial broad spectrum antibiotics. With IgM being positive at 1.26 will treat with 5 days of doxy BID. Unclear if what led to his initial presentation, but will continue treatment for catatonia above.  -day 2/5 of doxycycline  Hospital Acquired Left lower lung aspiration pneumonia Hypoxic respiratory failure Increased opacity on imaging with patient having worsening coarse breath sounds. Likely in setting of fluctuating mentation. MRSA nasal swab negative. Saturating well on 2L [place] -Day 1/7 of zosyn.  Enteral nutrition  Malnutrition Type 2 diabetes with hyperglycemia (A1C 8%) Tolerating cortrak feedings well. Morning glucose of of 158. With severity of his above medical problems, continue current regimen.  -Continue Semglee 35 units with SSI -Continue NovoLog to 9 units q4h -Continue free water flush 400 mL q4h   Acute kidney injury on CKD Unclear baseline. Current Cr of 1.49 -Daily BMP   Normocytic anemia Stable at 10.5. Suspect hemodilution with tube feedings. If continues to downtrend, order iron studies for further evaluation.  -Daily CBC    Pressure injuries Patient with pressure injury to right elbow, right lower extremity and right sacrum. Continue with frequent rolling and management of wounds per nursing staff.    Thyroid nodule 1.5 cm incidental thyroid nodule found on CT scan. -Outpatient thyroid ultrasound recommended   Diet: Tube Feeds IVF:  free water , VTE: Enoxaparin Code: DNR PT/OT recs: SNF for Subacute PT,  TOC recs: pending   Dispo: Anticipated discharge to Skilled nursing facility pending further evaluation  [place] Internal Medicine Resident PGY-3 Please contact the on call pager after 5 pm and on weekends at [phone].

## 2021-12-15 NOTE — Progress Notes (Signed)
Paged by RN for course breath sounds. When seen at bedside, patient was non verbal and did not appear in respiratory distress. He has loud upper air way noise. Patient is satting at 94% on 2L . He has received chest physiotherapy earlier without much improvement. Oral suction did not yield any product.   I suspect the course breath sound is from his inability to cough and clear secretion in the setting of catatonia. Patient is not following commands so it is difficult for him to work with SLP. Will give a dose of Robitussin and obtain CXR to rule out other pathologies. He may need more frequent chest physiotherapy tomorrow.

## 2021-12-16 ENCOUNTER — Encounter (HOSPITAL_COMMUNITY): Payer: Self-pay

## 2021-12-16 DIAGNOSIS — G9340 Encephalopathy, unspecified: Secondary | ICD-10-CM | POA: Diagnosis not present

## 2021-12-16 LAB — CULTURE, BLOOD (ROUTINE X 2)
Culture: NO GROWTH
Culture: NO GROWTH
Special Requests: ADEQUATE
Special Requests: ADEQUATE

## 2021-12-16 LAB — GLUCOSE, CAPILLARY
Glucose-Capillary: 103 mg/dL — ABNORMAL HIGH (ref 70–99)
Glucose-Capillary: 117 mg/dL — ABNORMAL HIGH (ref 70–99)
Glucose-Capillary: 131 mg/dL — ABNORMAL HIGH (ref 70–99)
Glucose-Capillary: 133 mg/dL — ABNORMAL HIGH (ref 70–99)
Glucose-Capillary: 134 mg/dL — ABNORMAL HIGH (ref 70–99)
Glucose-Capillary: 137 mg/dL — ABNORMAL HIGH (ref 70–99)

## 2021-12-16 MED ORDER — FOLIC ACID 1 MG PO TABS
1.0000 mg | ORAL_TABLET | Freq: Every day | ORAL | Status: DC
Start: 2021-12-16 — End: 2021-12-18
  Administered 2021-12-16 – 2021-12-18 (×3): 1 mg
  Filled 2021-12-16 (×3): qty 1

## 2021-12-16 MED ORDER — THIAMINE MONONITRATE 100 MG PO TABS
100.0000 mg | ORAL_TABLET | Freq: Every day | ORAL | Status: DC
  Administered 2021-12-16 – 2021-12-18 (×3): 100 mg
  Filled 2021-12-16 (×5): qty 1

## 2021-12-16 NOTE — Progress Notes (Signed)
Subjective: Mentation mildly improved today but still appears to be confused. Was able to move his legs on command.  Minimal verbal responses to questioning.  Objective:  Vital signs in last 24 hours: Vitals:   12/16/21 0500 12/16/21 0749 12/16/21 1108 12/16/21 1520  BP:  121/76 128/87 124/87  Pulse:  93 96 92  Resp:  '18 13 17  '$ Temp:  98.1 F (36.7 C) 98.1 F (36.7 C) 97.8 F (36.6 C)  TempSrc:  Axillary Oral Axillary  SpO2:   95% 91%  Weight: 87.8 kg     Height:       Physical Exam: Constitutional: ill-appearing, appears confused. Eyes: conjunctiva non-erythematous. Mydriasis improved.   Cardiovascular: regular rate and rhythm. No murmurs, rubs, or gallops.  Pulmonary: normal work of breathing on RA. Transmitted lung sounds in all lung fields.  Crackles at the bases bilaterally. Abdominal: soft, non-distended. MSK: no LE swelling.  Neurological: alert & oriented, able to speak a few words.  Skin: warm and dry. Well-healed abrasions of lower extremities. No pressure wounds noted. Psych: flat affect  Assessment/Plan:  Principal Problem:   Encephalopathy acute Active Problems:   Schizoaffective disorder, bipolar type (HCC)   Type 2 diabetes mellitus with stage 4 chronic kidney disease, with long-term current use of insulin (HCC)   Hypernatremia   Stage 2 skin ulcer of sacral region ([place])   Malnutrition of moderate degree   Aspiration pneumonia ([place])   [name] spotted fever  [name] is a 56 y/o patient with past medical history of PTSD, bipolar disorder, schizoaffective disorder, medication-induced tremors, and polypharmacy that presented with confusion and was admitted for acute encephalopathy likely secondary to polypharmacy and catatonia.     # Toxic metabolic encephalopathy # Hyperthermia # Acute hypoxic respiratory failure Likely secondary to polypharmacy versus catatonia.  Mentation has improved slightly today.  Psych recommends having the patient  transferred to [facility] for ECT.  Patient was accepted by the hospitalist at [facility].  Awaiting a bed for placement. - PO Ativan 2 mg q6h - Abilify 5 mg daily   2. # Rocky [place] spotted fever IgM + Initially presented with fever, petechial rash of distal extremities, and confusion.  Rash has resolved.  Uncertain if this is related to the patient's encephalopathy. - Doxycycline day 3/5    3. # Hospital Acquired Left lower lung aspiration pneumonia # Hypoxic respiratory failure Patient was noted to have coarse breath sounds.  Opacity noted on imaging.  MRSA swab negative.  Likely secondary to altered mentation.  SPO2 > 95% on room air. - Zosyn day 2/7    4. # Enteral nutrition  # Malnutrition # Type 2 diabetes with hyperglycemia (A1C 8%) Continues to tolerate core track feedings.  Morning glucose at 131. - Semglee 35 units with SSI - NovoLog 9 units q4h - Free water flush 400 mL q4h   5. # Acute kidney injury on CKD Unclear baseline creatinine. Most recent Cr 1.49.  - Trend BMP   6. # Normocytic anemia Hgb stable at 10.5.  Likely secondary to hemodilution from tube feedings. - Trend CBC   7. # Pressure injuries Presented with pressure injury to right elbow, right lower extremity, and right sacrum. - Continue frequent rolling and wound management   8. # Thyroid nodule CT demonstrated 1.5 cm incidental thyroid nodule.  -Thyroid ultrasound recommended as outpatient   Diet: Tube Feeds IVF:  free water  VTE: Enoxaparin Code: DNR  Prior to Admission Living Arrangement: at home by  himself Anticipated Discharge Location: SNF Barriers to Discharge: continued management Dispo: Anticipated discharge in approximately more than 2 day(s).   [name], MD 12/16/2021, 4:40 PM Pager: [phone] After 5pm on weekdays and 1pm on weekends: On Call pager [phone]

## 2021-12-16 NOTE — Progress Notes (Signed)
Patient has established bipolar disorder PTSD schizoaffective disorder and chronic tremor, admitted on 8/13 for AMS.  Initial work-up considered to be polypharmacy, both neuro and psychiatry has been following the patient as well.  The patient was found to be positive for [facility] spotted fever IgM, and then developed a aspiration pneumonia 2 days ago and started on Zosyn and doxycycline.  Latest concern from psychiatrist is patient developed psych medication induced malignant catatonia, his psych medication adjusted, however mentation not improving despite.  Today, psychiatrist can contact Dr. Weber Cooks at [facility] for possible ECT treatment.  Patient is transferred for ECT treatment at [facility].  Accepted to progressive unit, patient is right now on [place], but no other restraints.

## 2021-12-16 NOTE — Progress Notes (Signed)
Occupational Therapy Treatment Patient Details Name: [name]. MRN: [id number] DOB: July 24, 1965 Today's Date: 12/16/2021   History of present illness [name]. is a 56 y.o. male with PMHx of PTSD, bipolar disorder, schizoaffective disorder, medication-induced tremors, polypharmacy, T2DM, and HTN who presented to the ED on 12/01/2021 with altered mental status secondary to polypharmacy.   OT comments  Pt progressing towards established OT goals. Pt with greater arousal this session; only oriented to self and asking "what happened to me", but no carry over. Pt reporting hunger, and asking for scissors to open something to eat, but no food in room. Pt attempting to initiate bed mobility this session, but ultimately requiring max A. Pt sitting EOB with min-max A ~5+ minutes. Minimal eye opening in sitting. Reaching against gravity toward head. Provided comb, and naming object on command, however, when therapist handed item to pt, he attempted to put it in mouth, and required hand over hand to brush hair (movement not sustained when therapist released). Continue to recommend SNF for follow up OT to optimize safety and independence in ADL and IADL.    Recommendations for follow up therapy are one component of a multi-disciplinary discharge planning process, led by the attending physician.  Recommendations may be updated based on patient status, additional functional criteria and insurance authorization.    Follow Up Recommendations  Skilled nursing-short term rehab (<3 hours/day)    Assistance Recommended at Discharge Frequent or constant Supervision/Assistance  Patient can return home with the following  Two people to help with walking and/or transfers;Two people to help with bathing/dressing/bathroom;Assistance with cooking/housework;Assistance with feeding;Direct supervision/assist for medications management;Direct supervision/assist for financial management;Assist for  transportation;Help with stairs or ramp for entrance   Equipment Recommendations  Other (comment)    Recommendations for Other Services      Precautions / Restrictions Precautions Precautions: Fall Precaution Comments: cortrak Restrictions Weight Bearing Restrictions: No       Mobility Bed Mobility Overal bed mobility: Needs Assistance Bed Mobility: Rolling, Sidelying to Sit, Sit to Sidelying Rolling: Max assist, +2 for physical assistance Sidelying to sit: Total assist, +2 for physical assistance     Sit to sidelying: Total assist, +2 for physical assistance General bed mobility comments: pt with some initiation with UEs to reach across to roll but ultimately requiring maxA, pt with minimal initiation of LEs off EOB, totalAX2 to raise trunk to EOB, totalA to return LEs back to bed    Transfers Overall transfer level: Needs assistance                 General transfer comment: pt did initiate trying to scoot self back on the bed however unable to clear bottom to do so     Balance Overall balance assessment: Needs assistance Sitting-balance support: Feet supported, Bilateral upper extremity supported Sitting balance-Leahy Scale: Poor Sitting balance - Comments: pt requiring min/modA to prevent posterior lean, pt held head in flexion and was unable to raise it without assist from PT, although requiring up to max A to maintain sitting balance when raising head                                   ADL either performed or assessed with clinical judgement   ADL Overall ADL's : Needs assistance/impaired Eating/Feeding: NPO Eating/Feeding Details (indicate cue type and reason): Pt reporting "I'm starving" and oriented to cortrack and purpose. Grooming: Maximal assistance;Sitting  Grooming Details (indicate cue type and reason): Therapist provinding hand over hand A to brush hair as pt attempting to put comb in mouth. Pt able to state comb, but max difficulty  using for intended purpose. Pt unable to sustain brushing hair after initial hand over hand and requiring support from therapist throughout                 Toilet Transfer: Total assistance;+2 for physical assistance;+2 for safety/equipment;Maximal assistance Toilet Transfer Details (indicate cue type and reason): lateral scoot sitting EOB in preparation for participation in ADL. Pt requiring total A to scoot hips toward HOB (his R). Max A for rolling. Pt with good initiation to reach toward bed other side of body to initiate roll toward R, but ultimately max A Toileting- Clothing Manipulation and Hygiene: Total assistance;+2 for safety/equipment;+2 for physical assistance;Bed level Toileting - Clothing Manipulation Details (indicate cue type and reason): Therapist and nurse tech providing total A for posterior pericare in bed       General ADL Comments: Pt limited by level of arousal, ability to follow commands, and activity tolerance.    Extremity/Trunk Assessment Upper Extremity Assessment Upper Extremity Assessment: Generalized weakness (grossly 4-/5 grip strength. Lifting BUE above head in supine and reaching to face)   Lower Extremity Assessment Lower Extremity Assessment: Defer to PT evaluation        Vision   Vision Assessment?: Vision impaired- to be further tested in functional context Additional Comments: Eye opening in supine, brief periods of focus. Very limited attempts to track. No eye opening EOB.   Perception     Praxis      Cognition Arousal/Alertness: Lethargic Behavior During Therapy: Flat affect Overall Cognitive Status: Impaired/Different from baseline Area of Impairment: Orientation, Attention, Following commands, Safety/judgement, Awareness, Problem solving, Memory                 Orientation Level: Disoriented to, Place, Time, Situation (Pt stating "what happened to me", but no carry over)   Memory: Decreased short-term memory Following  Commands: Follows one step commands inconsistently, Follows one step commands with increased time Safety/Judgement: Decreased awareness of deficits (Unaware of situation, trying to bite mitten off.) Awareness: Intellectual (Unaware of bowel movememt, unaware of need for support to perform bed mobility/sitting balance, etc) Problem Solving: Slow processing, Requires verbal cues General Comments: pt with some communication this date, remains unaware of situation asking "what happened to me?", pt unable to state that he had a BM. Pt began falling asleep while PT/OT performing hygiene (pt dependent for pericare), pt unable to tend to task despite max verbal cues, pt immediately fell asleep once returned to supine. Pt reachig to scratch head, thus AROM of LUE, but when given comb, pt able to identify "comb", but attempting to put in mouth. Pt aware of hunger. Following some commands, and with good initiation "pull your feet off of the bed", but unable to sustain performance to achieve goals.        Exercises      Shoulder Instructions       General Comments Hr 103bpm, SpO2 >92% on RA, noted increased RR with transfers    Pertinent Vitals/ Pain       Pain Assessment Pain Assessment: Faces Faces Pain Scale: Hurts little more Pain Location: general grimacing/moaning with mobility Pain Descriptors / Indicators: Moaning, Grimacing Pain Intervention(s): Monitored during session, Limited activity within patient's tolerance  Home Living  Prior Functioning/Environment              Frequency  Min 2X/week        Progress Toward Goals  OT Goals(current goals can now be found in the care plan section)  Progress towards OT goals: Progressing toward goals  Acute Rehab OT Goals Patient Stated Goal: None stated OT Goal Formulation: With patient Time For Goal Achievement: 12/26/21 Potential to Achieve Goals: [place] Discharge  plan remains appropriate    Co-evaluation    PT/OT/SLP Co-Evaluation/Treatment: Yes Reason for Co-Treatment: Complexity of the patient's impairments (multi-system involvement);For patient/therapist safety PT goals addressed during session: Mobility/safety with mobility OT goals addressed during session: Strengthening/ROM;ADL's and self-care      AM-PAC OT "6 Clicks" Daily Activity     Outcome Measure   Help from another person eating meals?: Total Help from another person taking care of personal grooming?: A Lot Help from another person toileting, which includes using toliet, bedpan, or urinal?: Total Help from another person bathing (including washing, rinsing, drying)?: A Lot Help from another person to put on and taking off regular upper body clothing?: A Lot Help from another person to put on and taking off regular lower body clothing?: Total 6 Click Score: 9    End of Session    OT Visit Diagnosis: Unsteadiness on feet (R26.81);Other abnormalities of gait and mobility (R26.89);Muscle weakness (generalized) (M62.81);Low vision, both eyes (H54.2);Other symptoms and signs involving cognitive function;Pain Pain - part of body:  (pt not stating)   Activity Tolerance Patient limited by lethargy   Patient Left in bed;with call bell/phone within reach;with bed alarm set   Nurse Communication Mobility status        Time: 1020-1054 OT Time Calculation (min): 34 min  Charges: OT General Charges $OT Visit: 1 Visit OT Treatments $Self Care/Home Management : 8-22 mins  Shanda Howells, OTR/L [facility] Acute Rehabilitation Office: [phone]   Lula Olszewski 12/16/2021, 12:14 PM

## 2021-12-16 NOTE — Progress Notes (Signed)
Physical Therapy Treatment Patient Details Name: [name]. MRN: [id number] DOB: 04-Nov-1965 Today's Date: 12/16/2021   History of Present Illness [name]. is a 56 y.o. male with PMHx of PTSD, bipolar disorder, schizoaffective disorder, medication-induced tremors, polypharmacy, T2DM, and HTN who presented to the ED on 12/01/2021 with altered mental status secondary to polypharmacy.    PT Comments    Pt more awake today but confused and only oriented to self. Pt stating "I'm starving." Pt asking for scissors, when asked why pt stated "to cut something open to eat." Pt with some initiation of use of UEs and LEs to roll and transfer to EOB but ultimately requires max/totalAX2 for all mobility. Pt received covered in stool, pt without report he had BM or aware he did. Pt remains very stiff, deconditioned, and confused. Continue to recommend SNF upon d/c for maximal functional recovery. Acute PT to cont to follow.   Recommendations for follow up therapy are one component of a multi-disciplinary discharge planning process, led by the attending physician.  Recommendations may be updated based on patient status, additional functional criteria and insurance authorization.  Follow Up Recommendations  Skilled nursing-short term rehab (<3 hours/day) Can patient physically be transported by private vehicle: No   Assistance Recommended at Discharge Frequent or constant Supervision/Assistance  Patient can return home with the following Two people to help with walking and/or transfers;Two people to help with bathing/dressing/bathroom;Direct supervision/assist for medications management;Direct supervision/assist for financial management;Assist for transportation;Help with stairs or ramp for entrance   Equipment Recommendations   (TBD)    Recommendations for Other Services       Precautions / Restrictions Precautions Precautions: Fall Precaution Comments:  cortrak Restrictions Weight Bearing Restrictions: No     Mobility  Bed Mobility Overal bed mobility: Needs Assistance Bed Mobility: Rolling, Sidelying to Sit, Sit to Sidelying Rolling: Max assist, +2 for physical assistance Sidelying to sit: Total assist, +2 for physical assistance     Sit to sidelying: Total assist, +2 for physical assistance General bed mobility comments: pt with some initiation with UEs to reach across to roll but ultimately requiring maxA, pt with minimal initiation of LEs off EOB, totalAX2 to raise trunk to EOB, totalA to return LEs back to bed    Transfers Overall transfer level: Needs assistance                 General transfer comment: pt did initiate trying to scoot self back on the bed however unable to clear bottom to do so    Ambulation/Gait               General Gait Details: unable to attempt   Stairs             Wheelchair Mobility    Modified Rankin (Stroke Patients Only)       Balance Overall balance assessment: Needs assistance Sitting-balance support: Feet supported, Bilateral upper extremity supported Sitting balance-Leahy Scale: Poor Sitting balance - Comments: pt requiring min/modA to prevent posterior lean, pt held head in flexion and was unable to raise it without assist from PT                                    Cognition Arousal/Alertness: Lethargic Behavior During Therapy: Flat affect Overall Cognitive Status: Impaired/Different from baseline Area of Impairment: Orientation, Attention, Following commands, Safety/judgement, Awareness, Problem solving  Orientation Level: Disoriented to, Place, Time, Situation (pt asking "what happened to me", attempted to rep-orient patient however no carry over) Current Attention Level: Focused (pt kept eyes open majority of time however didn't focus on therapist)   Following Commands: Follows one step commands with increased time,  Follows one step commands inconsistently Safety/Judgement: Decreased awareness of deficits (unaware of situation, trying to bite L mitten off)     General Comments: pt with some communication this date, remains unaware of situation asking "what happened to me?", pt unable to state that he had a BM. Pt began falling asleep while PT/OT performing hygiene (pt dependent for pericare), pt unable to tend to task despite max verbal cues, pt immediately fell asleep once returned to supine        Exercises      General Comments General comments (skin integrity, edema, etc.): Hr 103bpm, SpO2 >92% on RA, noted increased RR with transfers      Pertinent Vitals/Pain Pain Assessment Pain Assessment: Faces Faces Pain Scale: Hurts little more Pain Location: general grimacing/moaning with mobility    Home Living                          Prior Function            PT Goals (current goals can now be found in the care plan section) Acute Rehab PT Goals PT Goal Formulation: Patient unable to participate in goal setting Time For Goal Achievement: 12/25/21 Potential to Achieve Goals: Fair Progress towards PT goals: Progressing toward goals    Frequency    Min 2X/week (will reassess once pt able to increase active participation)      PT Plan      Co-evaluation PT/OT/SLP Co-Evaluation/Treatment: Yes Reason for Co-Treatment: Complexity of the patient's impairments (multi-system involvement);For patient/therapist safety PT goals addressed during session: Mobility/safety with mobility        AM-PAC PT "6 Clicks" Mobility   Outcome Measure  Help needed turning from your back to your side while in a flat bed without using bedrails?: Total Help needed moving from lying on your back to sitting on the side of a flat bed without using bedrails?: Total Help needed moving to and from a bed to a chair (including a wheelchair)?: Total Help needed standing up from a chair using your arms  (e.g., wheelchair or bedside chair)?: Total Help needed to walk in hospital room?: Total Help needed climbing 3-5 steps with a railing? : Total 6 Click Score: 6    End of Session   Activity Tolerance: Patient limited by fatigue Patient left: in bed;with call bell/phone within reach;with bed alarm set (in chair position) Nurse Communication: Mobility status PT Visit Diagnosis: Unsteadiness on feet (R26.81);Muscle weakness (generalized) (M62.81);Difficulty in walking, not elsewhere classified (R26.2)     Time: 1020-1056 PT Time Calculation (min) (ACUTE ONLY): 36 min  Charges:  $Therapeutic Activity: 8-22 mins                     Kittie Plater, PT, DPT Acute Rehabilitation Services Secure chat preferred Office #: [phone]    Berline Lopes 12/16/2021, 11:21 AM

## 2021-12-16 NOTE — Consult Note (Cosign Needed Addendum)
[facility] Face-to-Face Psychiatric Evaluation   Service Date: December 16, 2021 LOS:  LOS: 15 days    Assessment  [name]. is a 56 y.o. male admitted medically for 12/01/2021  4:50 PM for altered mental status after being found down, was found in pool of urine and feces after several days. 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  Psychiatry was consulted for possible catatonia by internal medicine primary team. Psych provided consult on 12/02/21, patient was unresponsive to all questions, had horizontal nystagmus - advised to hold all psychotropic medications given concern for polypharmacy  Initial presentation was concerning for benzodiazepine withdrawal given that he had been found down after several days and was known to have rx clonazepam, UDS in the ED was negative, klonipin does not always show up on UDS from ED. However, despite aggressive treatment and thorough workup, patient continued to remain minimally responsive. Per chart review there appears to have been some improvement over the course of hospitalization then subsequent decline. He has had recurrent fevers and desaturation with no identifiable cause, negative workup for stroke, bleeds, seizures, infectious causes during this admission with inconclusive LP, CSF findings consistent with inflammation. CT head notable for mild to moderate generalized cerebral atrophy that is more advanced than would be expected for patient's age. Discussed with primary team the possibility of malignant catatonia on 12/04/21, psychiatry recommended '2mg'$  ativan q6 which he has been  getting since then.  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 No real improvement seen by physician team after second dose (although some noted by nursing staff). On review of prior episodes of profound AMS, pt does seem to improve when restarted on antipsychotics; notably his home medications include aripiprazole (which is a D2 partial agonist; withdrawal from this agent has been known to precipitate catatonia, and Abilify is part of the algorithm for treatment resistant catatonia). Will need to monitor for any increase in rigidity and/or precipitation of NMS after we start this agent (less likely as not a D2 antagonist).   With negative medical workup so far, presentation strongly concerning for catatonia. Per Dr. Weber Cooks, patient is candidate for ECT. Will plan to discuss with sister [name] who is medical power of attorney r/b/se of ECT and determine best course with shared decision making. 12/16/2021 Sister gave consent to move forward with ECT.   Patient continues to improve, responding well to abilify 5 mg daily and sch ativan. Now verbally responsive and less dystonia. Given improvement, recommend no med changes at this time. BFCS 14 (12/16/2021)  Patient accepted to [facility] for ECT (12/16/2021)  Diagnoses:  Active Hospital problems: Principal Problem:   Encephalopathy acute Active Problems:   Schizoaffective disorder, bipolar type ([place])   Type 2 diabetes mellitus with stage 4 chronic kidney disease, with long-term current use of insulin (HCC)   Hypernatremia   Stage 2 skin ulcer of sacral region ([place])   Malnutrition of moderate degree   Aspiration pneumonia ([place])   Rocky Mountain spotted fever   Plan  ## Safety and Observation Level:  - Based on my  clinical evaluation, I estimate the patient to be at low risk of self harm in the current setting - At this time, we recommend a  routine level of observation. This decision is based on my review of the chart including patient's history and current presentation, interview of the patient, mental status examination, and consideration of suicide risk including evaluating suicidal ideation, plan, intent, suicidal or self-harm behaviors, risk factors, and protective factors. This judgment is based on our ability to directly address suicide risk, implement suicide prevention strategies and develop a safety plan while the patient is in the clinical setting. Please contact our team if there is a concern that risk level has changed.  ## Medications:  -- Continue '2mg'$  Ativan QID -- Continue aripiprazole 5 mg  ## Medical Decision Making Capacity:  -- Sister [name] is health care power of attorney (have not personally reviewed documentation -- pt functionally lacks capacity to make own decisions at this time as he is nonverbal 2/2 psychiatric decompensation. Will need to be periodically reassessed.   ## Work-up:  -- Pertinent labwork reviewed earlier this admission includes:  - 8/21: CT head w/o contrast:  -IMPRESSION: No evidence of acute intracranial abnormality. Mild-to-moderate generalized cerebral atrophy, advanced for age.  - 8/15 EEG:  ABNORMALITY - Periodic discharges with triphasic morphology, generalized ( GPDs) - Continuous slow, generalized   IMPRESSION: This study is suggestive of moderate diffuse encephalopathy, most likely secondary to toxic-metabolic causes.  No seizures were seen throughout the recording.  Thank you for this consult request. Recommendations have been communicated to the primary team.  We will continue to follow at this time.   Merrily Brittle, DO  History  Relevant Aspects of Hospital Course:  Admitted on 12/01/2021 for fall, altered mental status. Has had significant workup; tends to have days where he will moan and say 1-2 words but is largely non-communicative and non-interactive. There has been  some marginal improvement over the past few days.   On exam today (12/16/2021) No family at bedside.   Patient was initially seen resting in hospital bed, awake, no acute distress. Patient was able to speak short 2-3 word phrases. Able to state his name, say he wasn't feeling well, his sacrum hurts. Patient would become sleepy in between questioning. Patient did not answer orientation questions.  ROS:  Unable to obtain due to catatonia  Collateral information:  12/16/2021 talked to sister, [name] at [phone] [facility]). Obtained consent for ECT if needed.   Called sister. Had ~10-15 minute conversation with her on current diagnosis, current treatment, and potential for ECT. Described the procedure and risks and benefits briefly; explained it essentially as a higher risk procedure that is more likely to have benefit for her brother at this time lead to improvement in psychiatric symptoms. She feels that given recent trajectory, she would like to pursue this option. Formal informed consent will need to be done by physician performing the procedure, ideally after POA papers located.   She describes that he has had similar episodes to this in the past but never this bad, she believes they were associated with his use of opioid medications.  Psychiatric History:  Information collected from chart review  - Schizoaffective d/o - Substance use d/o - Prior suicide attempt by multidrug overdose - memory problems - possible dementia, diagnosis has been given and questioned.   PTA psych medications listed include welllbutrin, abilify, buspirone, clonazepam, depakote, gabapentin, oxycodone, seroquel, tizanidine, trazadone. Record of barriers to accessing medication and periods of non-compliance.  Per chart review other episodes where he was "found down" with AMS resolved upon administration of home psychiatric med regimen.  Family psych history: unable to obtain.  Social History:  Per  sister [name], patient has lived alone for 3 years. [name] had to leave, will plan to obtain more social and family history from her at a future time.   Family History:   The patient's family history includes Cancer in his mother; Cervical cancer in his sister; Diabetes in his father and sister.  Medical History: Past Medical History:  Diagnosis Date   ADD (attention deficit disorder)    Anxiety    Arthritis    right hip   Bipolar 1 disorder (HCC)    Blood in urine    CKD (chronic kidney disease), stage III (HCC)    Creatinine elevation    Dementia ([place])    "early onset" (08/04/2017)   Depression    bipolar [facility]   Diabetes mellitus without complication ([place])    Family history of anesthesia complication    pt is unsure , but pt father may have been difficult to arouse    HCAP (healthcare-associated pneumonia) 10/31/2012   History of kidney stones    Hypertension    Hypogonadism male    Liver fatty degeneration    Microscopic hematuria    hereditary s/p Urology eval   Neuromuscular disorder ([place])    feet neuropathy    Osteoarthritis of right hip 11/28/2011   2012 2015 s/p THR Severe  Dr Novella Olive     Pleural effusion 11/02/2012   Pneumonia 10-2012   Pneumonia, organism unspecified([place]) 11/02/2012   Polysubstance dependence, non-opioid, in remission ([place])    remote   Primary osteoarthritis of left hip 05/22/2015   PTSD (post-traumatic stress disorder)    SOCIAL ANXIETY DISORDER    Schizoaffective disorder ([place])    Substance abuse ([place])    Suicide attempt by multiple drug overdose ([place]) Jan 25, 2016   Grieving his cat's death 2015-08-02    Surgical History: Past Surgical History:  Procedure Laterality Date   BACK SURGERY     CLOSED REDUCTION METACARPAL WITH PERCUTANEOUS PINNING Right    LUMBAR [place]     TOTAL HIP ARTHROPLASTY Right 08/16/2013   Procedure: TOTAL HIP ARTHROPLASTY ANTERIOR APPROACH;  Surgeon: Hessie Dibble, MD;  Location: [facility];   Service: Orthopedics;  Laterality: Right;   TOTAL HIP ARTHROPLASTY Left 05/22/2015   Procedure: TOTAL HIP ARTHROPLASTY ANTERIOR APPROACH;  Surgeon: Melrose Nakayama, MD;  Location: [facility];  Service: Orthopedics;  Laterality: Left;    Medications:   Current Facility-Administered Medications:    acetaminophen (TYLENOL) 160 MG/5ML solution 650 mg, 650 mg, Per Tube, Q6H PRN, 650 mg at 12/16/21 1143 **OR** acetaminophen (TYLENOL) suppository 650 mg, 650 mg, Rectal, Q6H PRN, Heber Bettles, Erik C, DO   ARIPiprazole (ABILIFY) tablet 5 mg, 5 mg, Per Tube, Daily, Hoffman, Erik C, DO, 5 mg at 12/16/21 1124   doxycycline (VIBRA-TABS) tablet 100 mg, 100 mg, Per Tube, Q12H, Katsadouros, Vasilios, MD, 100 mg at 12/16/21 1124   enoxaparin (LOVENOX) injection 40 mg, 40 mg, Subcutaneous, Q24H, Hoffman, Erik C, DO, 40 mg at 12/16/21 1125   feeding supplement (GLUCERNA 1.5 CAL) liquid 1,000 mL, 1,000 mL, Per Tube, Continuous, Lucious Groves, DO, Last Rate: 70 mL/hr at 12/16/21 0101, 1,000 mL at 46/96/29 5284   folic acid (FOLVITE) tablet 1 mg, 1 mg, Per Tube, Daily, Angelica Pou, MD, 1 mg at 12/16/21  1125   free water 400 mL, 400 mL, Per Tube, Q4H, [name], DO, 400 mL at 12/16/21 1126   guaiFENesin (ROBITUSSIN) 100 MG/5ML liquid 15 mL, 15 mL, Per Tube, Q4H PRN, Sanjuan Dame, MD, 15 mL at 12/15/21 2129   insulin aspart (novoLOG) injection 0-9 Units, 0-9 Units, Subcutaneous, Q4H, Hoffman, Rachel Moulds, DO, 1 Units at 12/16/21 1159   insulin aspart (novoLOG) injection 8 Units, 8 Units, Subcutaneous, Q4H, Sridharan, Sriramkumar, MD, 8 Units at 12/16/21 1158   insulin glargine-yfgn (SEMGLEE) injection 35 Units, 35 Units, Subcutaneous, Daily, Gaylan Gerold, DO, 35 Units at 12/16/21 1125   ipratropium-albuterol (DUONEB) 0.5-2.5 (3) MG/3ML nebulizer solution 3 mL, 3 mL, Nebulization, Q4H PRN, Jodell Cipro, Sriramkumar, MD, 3 mL at 12/14/21 2106   LORazepam (ATIVAN) tablet 2 mg, 2 mg, Per Tube, QID, Lucious Groves, DO, 2  mg at 12/16/21 1124   Oral care mouth rinse, 15 mL, Mouth Rinse, PRN, Lucious Groves, DO   Oral care mouth rinse, 15 mL, Mouth Rinse, 4 times per day, Joni Reining C, DO, 15 mL at 12/16/21 1126   pantoprazole (PROTONIX) 2 mg/mL oral suspension 40 mg, 40 mg, Per Tube, Daily, [name], DO, 40 mg at 12/16/21 1125   piperacillin-tazobactam (ZOSYN) IVPB 3.375 g, 3.375 g, Intravenous, Q8H, Katsadouros, Vasilios, MD, Last Rate: 12.5 mL/hr at 12/16/21 1157, 3.375 g at 12/16/21 1157   thiamine (VITAMIN B1) tablet 100 mg, 100 mg, Per Tube, Daily, Angelica Pou, MD, 100 mg at 12/16/21 1125  Allergies: Allergies  Allergen Reactions   Vicodin [Hydrocodone-Acetaminophen] Itching   Objective  Vital signs:  Temp:  [98.1 F (36.7 C)-99 F (37.2 C)] 98.1 F (36.7 C) (08/28 1108) Pulse Rate:  [87-96] 96 (08/28 1108) Resp:  [13-18] 13 (08/28 1108) BP: (117-135)/(69-89) 128/87 (08/28 1108) SpO2:  [94 %-98 %] 95 % (08/28 1108) Weight:  [87.8 kg] 87.8 kg (08/28 0500)  Psychiatric Specialty Exam: unable to complete due to catatonic state. Bush-Francis Catatonia Scale: 12 points  Physical Exam Vitals and nursing note reviewed.  Constitutional:      Appearance: He is ill-appearing. He is not diaphoretic.  Musculoskeletal:     Comments: Inc tone in RUE into flexion at elbow and wrist. LUE improving increased tone  Neurological:     Mental Status: He is alert.     Blood pressure 128/87, pulse 96, temperature 98.1 F (36.7 C), temperature source Oral, resp. rate 13, height '6\' 3"'$  (1.905 m), weight 87.8 kg, SpO2 95 %. Body mass index is 24.19 kg/m.

## 2021-12-17 ENCOUNTER — Ambulatory Visit: Payer: Medicare Other | Admitting: Internal Medicine

## 2021-12-17 DIAGNOSIS — Z930 Tracheostomy status: Secondary | ICD-10-CM

## 2021-12-17 DIAGNOSIS — D500 Iron deficiency anemia secondary to blood loss (chronic): Secondary | ICD-10-CM

## 2021-12-17 DIAGNOSIS — E871 Hypo-osmolality and hyponatremia: Secondary | ICD-10-CM

## 2021-12-17 DIAGNOSIS — J690 Pneumonitis due to inhalation of food and vomit: Secondary | ICD-10-CM

## 2021-12-17 LAB — GLUCOSE, CAPILLARY
Glucose-Capillary: 135 mg/dL — ABNORMAL HIGH (ref 70–99)
Glucose-Capillary: 132 mg/dL — ABNORMAL HIGH (ref 70–99)
Glucose-Capillary: 205 mg/dL — ABNORMAL HIGH (ref 70–99)
Glucose-Capillary: 103 mg/dL — ABNORMAL HIGH (ref 70–99)
Glucose-Capillary: 120 mg/dL — ABNORMAL HIGH (ref 70–99)

## 2021-12-17 LAB — CBC
HCT: 32.4 % — ABNORMAL LOW (ref 39.0–52.0)
Hemoglobin: 11.3 g/dL — ABNORMAL LOW (ref 13.0–17.0)
MCH: 28.8 pg (ref 26.0–34.0)
MCHC: 34.9 g/dL (ref 30.0–36.0)
MCV: 82.7 fL (ref 80.0–100.0)
Platelets: 306 10*3/uL (ref 150–400)
RBC: 3.92 MIL/uL — ABNORMAL LOW (ref 4.22–5.81)
RDW: 13.1 % (ref 11.5–15.5)
WBC: 7.2 10*3/uL (ref 4.0–10.5)
nRBC: 0.3 % — ABNORMAL HIGH (ref 0.0–0.2)

## 2021-12-17 LAB — BASIC METABOLIC PANEL
Anion gap: 8 (ref 5–15)
BUN: 32 mg/dL — ABNORMAL HIGH (ref 6–20)
CO2: 23 mmol/L (ref 22–32)
Calcium: 9.1 mg/dL (ref 8.9–10.3)
Chloride: 102 mmol/L (ref 98–111)
Creatinine, Ser: 1.38 mg/dL — ABNORMAL HIGH (ref 0.61–1.24)
GFR, Estimated: >60 mL/min (ref >60)
Glucose, Bld: 140 mg/dL — ABNORMAL HIGH (ref 70–99)
Potassium: 3.6 mmol/L (ref 3.5–5.1)
Sodium: 133 mmol/L — ABNORMAL LOW (ref 135–145)

## 2021-12-17 LAB — EHRLICHIA ANTIBODY PANEL
E chaffeensis (HGE) Ab, IgG: NEGATIVE
E chaffeensis (HGE) Ab, IgM: NEGATIVE
E. Chaffeensis (HME) IgM Titer: NEGATIVE
E.Chaffeensis (HME) IgG: NEGATIVE

## 2021-12-17 LAB — PHOSPHORUS: Phosphorus: 4.3 mg/dL (ref 2.5–4.6)

## 2021-12-17 LAB — MAGNESIUM: Magnesium: 2 mg/dL (ref 1.7–2.4)

## 2021-12-17 MED ORDER — FREE WATER
400.0000 mL | Freq: Four times a day (QID) | Status: DC
  Administered 2021-12-17 (×2): 400 mL

## 2021-12-17 MED ORDER — LORAZEPAM 2 MG/ML IJ SOLN
2.0000 mg | Freq: Four times a day (QID) | INTRAMUSCULAR | Status: DC
  Administered 2021-12-17 – 2021-12-18 (×2): 2 mg via INTRAVENOUS
  Filled 2021-12-17 (×2): qty 1

## 2021-12-17 NOTE — Progress Notes (Signed)
Subjective:  Mentation relatively unchanged from yesterday.  Patient still appears confused.  Still speaking in 2 word sentences with only occasional appropriate responses. Updated on plan to transfer to [facility] for ECT.   Objective:  Vital signs in last 24 hours: Vitals:   12/16/21 1520 12/16/21 1955 12/16/21 2320 12/17/21 0545  BP: 124/87     Pulse: 92     Resp: '17 17 16   '$ Temp: 97.8 F (36.6 C) 98 F (36.7 C) 98.3 F (36.8 C) 98.1 F (36.7 C)  TempSrc: Axillary Oral Oral Oral  SpO2: 91%  92%   Weight:      Height:       Physical Exam: Constitutional: chronically ill-appearing, confused. Eyes: conjunctiva non-erythematous.   Cardiovascular: regular rate and rhythm. No murmurs, rubs, or gallops.  Pulmonary: normal work of breathing on RA. Transmitted lung sounds present in all lung fields. Minimal crackles at the bases bilaterally. Abdominal: soft, non-distended. MSK: no LE swelling.  Neurological: alert & oriented, only able to speak a few words.  Skin: warm and dry. Well-healed abrasions of lower extremities. No pressure wounds noted. Psych: flat affect  Assessment/Plan:  Principal Problem:   Encephalopathy acute Active Problems:   Schizoaffective disorder, bipolar type (HCC)   Type 2 diabetes mellitus with stage 4 chronic kidney disease, with long-term current use of insulin (HCC)   Hypernatremia   Stage 2 skin ulcer of sacral region ([place])   Malnutrition of moderate degree   Aspiration pneumonia ([place])   [facility] spotted fever  [name] is a 56 y/o patient with past medical history of PTSD, bipolar disorder, schizoaffective disorder, medication-induced tremors, and polypharmacy that presented with confusion and was admitted for acute encephalopathy likely secondary to polypharmacy and catatonia.     # Toxic metabolic encephalopathy # Hyperthermia # Acute hypoxic respiratory failure Due to polypharmacy versus catatonia.  Mentation remains relatively  unchanged from yesterday.  Still awaiting bed for transfer to [place] for ECT per psych recommendation. - Ativan 2 mg q6h - Abilify 5 mg daily   2. # Rocky mountain spotted fever IgM + Initial presentation included fever, petechial rash of distal extremities, and confusion.  Rash remains resolved. Potentially related to his encephalopathy.  WBC count remains normal today at 7.2 - Doxycycline day 4/5    3. # Hospital Acquired Left lower lung aspiration pneumonia # Hypoxic respiratory failure Coarse breath sounds noted previously. Opacity present on imaging. MRSA swab negative. BC negative at 5 days. Aspiration likely secondary to altered mentation.  SpO2 remains > 95% on room air. - Zosyn day 3/7    4. # Enteral nutrition  # Malnutrition # Type 2 diabetes with hyperglycemia (A1C 8%) Tolerating core track feedings. Morning glucose at 140. - Semglee 35 units with SSI - NovoLog 9 units q4h - Decrease free water flush to 400 mL q6h for hyponatremia   5. # Acute kidney injury on CKD Baseline creatinine unclear. Cr improved to 1.38 today.  BUN improved today to 32. - Trend BMP   6. # Normocytic anemia Hgb stable at 11.3 today. Secondary to hemodilution from tube feedings. - Trend CBC   7. # Pressure injuries Pressure injury to right elbow, right lower extremity, and right sacrum on admission.  - Continue rolling and wound management   8. # Thyroid nodule 1.5 cm incidental thyroid nodule on CT.  - Outpatient thyroid US recommended   9. # Hyponatremia Sodium 133 today.  Likely secondary to free water flushes. --  Decrease free water flush frequency as stated above   Diet: Tube Feeds IVF:  free water  VTE: Enoxaparin Code: DNR   Prior to Admission Living Arrangement: at home by himself Anticipated Discharge Location: SNF Barriers to Discharge: continued management Dispo: Anticipated discharge in approximately less than 2 day(s).   Jonathan Skeans, MD 12/17/2021, 6:23 AM Pager:  [phone] After 5pm on weekdays and 1pm on weekends: On Call pager [phone]

## 2021-12-17 NOTE — Progress Notes (Signed)
  Date: 12/17/2021  Patient name: [name].  Medical record number: [id number]  Date of birth: 1965-09-04        I have seen and evaluated this patient and I have discussed the plan of care with the house staff. Please see Dr. Gilmer Mor note for complete details. I concur with his findings and plan.  Possible transfer to [facility] when bed available at that facility.   Sid Falcon, MD 12/17/2021, 4:57 PM

## 2021-12-17 NOTE — Progress Notes (Signed)
Speech Language Pathology Treatment: Dysphagia  Patient Details Name: [name]. MRN: [id number] DOB: 28-Jun-1965 Today's Date: 12/17/2021 Time: 5809-9833 SLP Time Calculation (min) (ACUTE ONLY): 20 min  Assessment / Plan / Recommendation Clinical Impression  Pt is able to wake up f/c. Making jokes at times, smiling. Vocal quality a little wet still at baseline. SLP repositioned as best as possible (still quite slouched) and offered ice, water and ensure. Intermittent coughing after sips. Difficulty to determine if this is dysphagia related or related to congestion. Pt also requiring cues for total assisted feeding with pudding by end of session due to increasing fatigue. Pt is improving but is still very weak. Recommend ice chips and sips of water today if pt requests it. Will f/u with MBS tomorrow to determine readiness for a diet.   HPI HPI: Pt is a 56 year old male who presented 8/13 after he was found down at home. EEg 8/16: suggestive of moderate to severe diffuse encephalopathy, most likely secondary to toxic-metabolic causes. Psychotropic withdrawal suspected. CT head 8/16 and MRI brain 8/13 negative for acute changes. PMH: bipolar 1, PTSD, schizoaffective disorder, substance abuse, polypharmacy, DM2, CKD, hip osteoarthritis.      SLP Plan  Continue with current plan of care;MBS      Recommendations for follow up therapy are one component of a multi-disciplinary discharge planning process, led by the attending physician.  Recommendations may be updated based on patient status, additional functional criteria and insurance authorization.    Recommendations                   Follow Up Recommendations: Skilled nursing-short term rehab (<3 hours/day) Plan: Continue with current plan of care;MBS           [name], Katherene Ponto  12/17/2021, 10:48 AM

## 2021-12-18 ENCOUNTER — Inpatient Hospital Stay (HOSPITAL_COMMUNITY): Payer: Medicare Other

## 2021-12-18 DIAGNOSIS — G9340 Encephalopathy, unspecified: Secondary | ICD-10-CM | POA: Diagnosis not present

## 2021-12-18 LAB — BASIC METABOLIC PANEL
Anion gap: 10 (ref 5–15)
BUN: 30 mg/dL — ABNORMAL HIGH (ref 6–20)
CO2: 22 mmol/L (ref 22–32)
Calcium: 9.3 mg/dL (ref 8.9–10.3)
Chloride: 105 mmol/L (ref 98–111)
Creatinine, Ser: 1.47 mg/dL — ABNORMAL HIGH (ref 0.61–1.24)
GFR, Estimated: 56 mL/min — ABNORMAL LOW (ref >60)
Glucose, Bld: 165 mg/dL — ABNORMAL HIGH (ref 70–99)
Potassium: 4.1 mmol/L (ref 3.5–5.1)
Sodium: 137 mmol/L (ref 135–145)

## 2021-12-18 LAB — CBC
HCT: 34 % — ABNORMAL LOW (ref 39.0–52.0)
Hemoglobin: 11.5 g/dL — ABNORMAL LOW (ref 13.0–17.0)
MCH: 28.5 pg (ref 26.0–34.0)
MCHC: 33.8 g/dL (ref 30.0–36.0)
MCV: 84.4 fL (ref 80.0–100.0)
Platelets: 312 10*3/uL (ref 150–400)
RBC: 4.03 MIL/uL — ABNORMAL LOW (ref 4.22–5.81)
RDW: 13.4 % (ref 11.5–15.5)
WBC: 6.9 10*3/uL (ref 4.0–10.5)
nRBC: 0.3 % — ABNORMAL HIGH (ref 0.0–0.2)

## 2021-12-18 LAB — GLUCOSE, CAPILLARY
Glucose-Capillary: 107 mg/dL — ABNORMAL HIGH (ref 70–99)
Glucose-Capillary: 98 mg/dL (ref 70–99)
Glucose-Capillary: 165 mg/dL — ABNORMAL HIGH (ref 70–99)
Glucose-Capillary: 157 mg/dL — ABNORMAL HIGH (ref 70–99)
Glucose-Capillary: 152 mg/dL — ABNORMAL HIGH (ref 70–99)
Glucose-Capillary: 174 mg/dL — ABNORMAL HIGH (ref 70–99)

## 2021-12-18 MED ORDER — ACETAMINOPHEN 325 MG PO TABS
650.0000 mg | ORAL_TABLET | Freq: Four times a day (QID) | ORAL | Status: DC | PRN
Start: 2021-12-18 — End: 2021-12-20

## 2021-12-18 MED ORDER — SODIUM CHLORIDE 0.9 % IV SOLN: INTRAVENOUS | Status: AC

## 2021-12-18 MED ORDER — GUAIFENESIN 100 MG/5ML PO LIQD: 15.0000 mL | ORAL | Status: DC | PRN

## 2021-12-18 MED ORDER — ACETAMINOPHEN 160 MG/5ML PO SOLN: 650.0000 mg | Freq: Four times a day (QID) | ORAL | Status: DC | PRN

## 2021-12-18 MED ORDER — LORAZEPAM 2 MG/ML IJ SOLN
2.0000 mg | Freq: Three times a day (TID) | INTRAMUSCULAR | Status: DC
  Administered 2021-12-18 – 2021-12-19 (×5): 2 mg via INTRAVENOUS
  Filled 2021-12-18 (×5): qty 1

## 2021-12-18 MED ORDER — THIAMINE MONONITRATE 100 MG PO TABS
100.0000 mg | ORAL_TABLET | Freq: Every day | ORAL | Status: DC
  Administered 2021-12-19: 100 mg via ORAL
  Filled 2021-12-18: qty 1

## 2021-12-18 MED ORDER — ARIPIPRAZOLE 10 MG PO TABS
5.0000 mg | ORAL_TABLET | Freq: Every day | ORAL | Status: DC
  Administered 2021-12-19: 5 mg via ORAL
  Filled 2021-12-18: qty 1

## 2021-12-18 MED ORDER — PANTOPRAZOLE SODIUM 40 MG PO TBEC
40.0000 mg | DELAYED_RELEASE_TABLET | Freq: Every day | ORAL | Status: DC
  Administered 2021-12-18 – 2021-12-19 (×2): 40 mg via ORAL
  Filled 2021-12-18 (×2): qty 1

## 2021-12-18 MED ORDER — DOXYCYCLINE HYCLATE 100 MG PO TABS
100.0000 mg | ORAL_TABLET | Freq: Two times a day (BID) | ORAL | Status: AC
  Administered 2021-12-18 – 2021-12-19 (×2): 100 mg via ORAL
  Filled 2021-12-18 (×2): qty 1

## 2021-12-18 MED ORDER — PANTOPRAZOLE 2 MG/ML SUSPENSION
40.0000 mg | Freq: Every day | ORAL | Status: DC
Start: 2021-12-19 — End: 2021-12-18

## 2021-12-18 MED ORDER — FOLIC ACID 1 MG PO TABS
1.0000 mg | ORAL_TABLET | Freq: Every day | ORAL | Status: DC
  Administered 2021-12-19: 1 mg via ORAL
  Filled 2021-12-18: qty 1

## 2021-12-18 NOTE — Progress Notes (Signed)
Subjective:   Patient pulled out core track overnight.  Mentation continues to improve.  Able to provide comprehensible answers to some questions.  Discussed plan to speak with psych about plan today.  Objective:  Vital signs in last 24 hours: Vitals:   12/17/21 1125 12/17/21 1542 12/17/21 2017 12/18/21 0405  BP: 99/75 132/86    Pulse: 89 92    Resp: 19 15    Temp: 98.6 F (37 C) 98.7 F (37.1 C) 98.6 F (37 C) (!) 97.1 F (36.2 C)  TempSrc: Axillary Axillary Oral Oral  SpO2: 100%  98% 97%  Weight:      Height:       Physical Exam: Constitutional: chronically ill-appearing, confused. Eyes: conjunctiva non-erythematous.   Cardiovascular: regular rate and rhythm. No murmurs, rubs, or gallops.  Pulmonary: normal work of breathing on RA. No wheezes, rales, or rhonchi. Transmitted lung sounds present in all lung fields.  Abdominal: soft, non-distended. MSK: no LE swelling.  Neurological: alert & oriented, only able to speak a few words but responding appropriately to some questions.  Skin: warm and dry. Well-healed abrasions of lower extremities. No pressure wounds noted. Psych: flat affect  Assessment/Plan:  Principal Problem:   Encephalopathy acute Active Problems:   Schizoaffective disorder, bipolar type (HCC)   Type 2 diabetes mellitus with stage 4 chronic kidney disease, with long-term current use of insulin (HCC)   Hypernatremia   Stage 2 skin ulcer of sacral region ([place])   Malnutrition of moderate degree   Aspiration pneumonia ([place])   [facility] spotted fever  [name] is a 56 y/o patient with past medical history of PTSD, bipolar disorder, schizoaffective disorder, medication-induced tremors, and polypharmacy that presented with confusion and was admitted for acute encephalopathy likely secondary to polypharmacy and catatonia.     # Toxic metabolic encephalopathy # Hyperthermia # Acute hypoxic respiratory failure Likely secondary to polypharmacy versus  catatonia.  Mentation mildly improved from yesterday. Psych reassessed and noted improvement in mentation. Psych decreased ativan '2mg'$  qid to '2mg'$  tid. Still awaiting bed for transfer to [place] for ECT per psych recommendation.  Spoke with psychiatry who states that they cannot expedite this process and given improvement in his mentation and are comfortable with SNF. - Ativan 2 mg TID - Abilify 5 mg daily   2. # Rocky mountain spotted fever IgM + Initially presented w/ fever, petechial rash of distal extremities, and confusion. Rash still resolved. Likely contributing to his encephalopathy.  WBC count remains normal at 6.9 today. - Doxycycline day 5/5    3. # Hospital Acquired Left lower lung aspiration pneumonia # Hypoxic respiratory failure Coarse breath sounds noted previously. Opacity present CXR. MRSA swab negative. BC negative remain negative. Aspiration likely secondary to altered mentation.  SpO2 remains > 95% on room air. - Zosyn day 4/7    4. # Enteral nutrition  # Malnutrition # Type 2 diabetes with hyperglycemia (A1C 8%) Patient pulled out core track overnight.  DG swallow function ordered.  Speech pathology recommends dysphagia 2 diet.  Speech pathology comfortable with switching medications to PO as long as they are crushed.  Per tube medications switch to PO. Morning glucose at 140. - Semglee 35 units with SSI - NovoLog 9 units q4h - Free water flush to 400 mL q6h    5. *# Acute kidney injury on CKD Baseline creatinine unclear. Cr worsened to 1.47 today. BUN improved to 30 today. Will consider IVF as necessary.  - Trend BMP  6. # Normocytic anemia Hgb improved to 11.5 today. Likely secondary to hemodilution from tube feedings. - Trend CBC   7. # Pressure injuries Pressure injury to right elbow, right lower extremity, and right sacrum on admission.  - Continue rolling and wound management   8. # Thyroid nodule 1.5 cm incidental thyroid nodule on CT.  - Outpatient  thyroid US recommended    9. # Hyponatremia Sodium normalized to 137 today.  - Free water flush frequency decreased   Diet: Dysphagia 2  IVF:  None VTE: Enoxaparin Code: DNR   Prior to Admission Living Arrangement: at home by himself Anticipated Discharge Location: SNF Barriers to Discharge: continued management Dispo: Anticipated discharge in approximately less than 2 day(s).   Jonathan Skeans, MD 12/18/2021, 6:06 AM Pager: [phone] After 5pm on weekdays and 1pm on weekends: On Call pager [phone]

## 2021-12-18 NOTE — Progress Notes (Signed)
Occupational Therapy Treatment Patient Details Name: [name]. MRN: [id number] DOB: 12/07/1965 Today's Date: 12/18/2021   History of present illness Mickel Schreur. is a 56 y.o. male with PMHx of PTSD, bipolar disorder, schizoaffective disorder, medication-induced tremors, polypharmacy, T2DM, and HTN who presented to the ED on 12/01/2021 with altered mental status secondary to polypharmacy.   OT comments  Pt progressing towards established OT goals. Pt continues to present with decreased attention, awareness, problem solving, memory, balance, and activity tolerance. Pt performing bed mobility with mod-maxA +2. Pt sitting EOB for ~3 minutes and engaging in reaching activities but refusing to brush teeth. Pt self-initiating return to sidelying and reporting tired with prompting. Pt performing oral care at bed level with mouth swab with min A for initiation of grasp. Pt with greater communication, ability to express wants/needs, and eye contact this date. Pt with potential visual difficulty as indicated by difficulty with vertical tracking. Continue to recommend discharge at SNF for OT services to optimize safety and independence in ADL and IADL.    Recommendations for follow up therapy are one component of a multi-disciplinary discharge planning process, led by the attending physician.  Recommendations may be updated based on patient status, additional functional criteria and insurance authorization.    Follow Up Recommendations  Skilled nursing-short term rehab (<3 hours/day)    Assistance Recommended at Discharge Frequent or constant Supervision/Assistance  Patient can return home with the following  Two people to help with walking and/or transfers;Two people to help with bathing/dressing/bathroom;Assistance with cooking/housework;Assistance with feeding;Direct supervision/assist for medications management;Direct supervision/assist for financial management;Assist for  transportation;Help with stairs or ramp for entrance   Equipment Recommendations  Other (comment) (TBD)    Recommendations for Other Services      Precautions / Restrictions Precautions Precautions: Fall Restrictions Weight Bearing Restrictions: No Other Position/Activity Restrictions: no       Mobility Bed Mobility Overal bed mobility: Needs Assistance Bed Mobility: Rolling, Sidelying to Sit, Sit to Sidelying Rolling: +2 for physical assistance, Mod assist Sidelying to sit: Max assist, +2 for physical assistance     Sit to sidelying: Mod assist, +2 for physical assistance General bed mobility comments: Pt requiring mod A +2 for rolling, however, observed to roll 1/2 way onto side following session, despite having prior difficulty initiating on command. Pt requiring max A for trunk elevation, but good initiation of BLE off of EOB (max cues to maintain off EOB while attempting to push up). Mod A for balance in trunk descent and min A for BLE to return to lying.    Transfers                   General transfer comment: pt did initiate trying to scoot self back on the bed however unable to clear bottom to do so     Balance Overall balance assessment: Needs assistance Sitting-balance support: Feet supported, Bilateral upper extremity supported Sitting balance-Leahy Scale: Poor Sitting balance - Comments: Pt raising head to midline sitting EOB, but observed with flexed posture sitting EOB. Mod-max A for sitting balance. Pt had a bath before session and swallow study today. Self-initiating lying down. With skill to report yes when asked if needs a break                                   ADL either performed or assessed with clinical judgement   ADL Overall ADL's :  Needs assistance/impaired Eating/Feeding: Minimal assistance;Bed level Eating/Feeding Details (indicate cue type and reason): On nectar thick diet. Pt requesting orange juice. Set-up and min A due  to decreased awareness of straw facing away from patient. Grooming: Bed level;Oral care;Minimal assistance Grooming Details (indicate cue type and reason): Therapist providing sponge for oral care, and pt requiring min A to initiate grasp of sponge, however, able to bring to mouth without A. Sustaining oral care for ~20-30 seconds, but observed to only swab front teeth/not thorough             Lower Body Dressing: Total assistance;Bed level Lower Body Dressing Details (indicate cue type and reason): Total A to don socks                    Extremity/Trunk Assessment Upper Extremity Assessment Upper Extremity Assessment: Generalized weakness (Pt actively moving BUE, and with purposeful movement of both. Using RUE for daily tasks, but willing to reach for therapist hand with either UE on command)   Lower Extremity Assessment Lower Extremity Assessment: Defer to PT evaluation        Vision   Vision Assessment?: Vision impaired- to be further tested in functional context Additional Comments: Pt reporting he feels his visio is blurry. Unable to participate in visual testing due to decreased attention and increased effort resulting in visual fatigue during attempt to track. Tracking horizontally to L and R but no attempt to track vertically. Pt acknowledging difficulty when asked.   Perception     Praxis      Cognition Arousal/Alertness: Awake/alert, Lethargic Behavior During Therapy: Flat affect Overall Cognitive Status: Impaired/Different from baseline Area of Impairment: Orientation, Attention, Following commands, Safety/judgement, Awareness, Problem solving, Memory                 Orientation Level: Disoriented to, Place, Time, Situation (Pt unaware of recent birthday, but able to state his date of birth. Pt unable to recall that his sister brought him balloons on his birthday. Pt frequently asking what happened to me.) Current Attention Level: Sustained, Focused (Pt  sustaining eye contact ~30 seconds, and receptive to back and forth communication for up to one minute intervals, however, tangential and sometimes unmeaningful responses.) Memory: Decreased short-term memory (With some carry over this session. Reporting he wants orange juice and bringing orange juice back up after distraction of therapist asking whether he wanted socks off.) Following Commands: Follows one step commands inconsistently, Follows one step commands with increased time (cues for following commands. Attempting to follow 3/5 with mod-max cues, set-up, and increased time.) Safety/Judgement: Decreased awareness of deficits (Unaware of situation, and not able to understand why he requires thickened liquids as part of diet) Awareness: Intellectual (unaware of need but accepting of support this date. Attempting to lie straight back on bed.) Problem Solving: Slow processing, Requires verbal cues General Comments: Pt attending to tasks for short periods this date. Performing oral care at bed level for ~30 seconds with increased time for initiation, and observed to only brush front teeth. Pt reporting he does not have any teeth, but upon observation, he does. Pt with tangential conversation, and pt reporting that the "stories he is sharing are old memories" despite pt reference as recent. Pt with some carry over this session, and able to ask simple questions, and communicate basic needs. Following 2-3/5 commands with max cues and incresed time. Continues to moan with movement, but unable to verbalize pain. With improved attention, making eye contact with therapist during  reciprocal conversation.        Exercises Exercises: Other exercises Other Exercises Other Exercises: AROM reaching sitting EOB 2x BUE    Shoulder Instructions       General Comments  Sister would like to continue exploring discharge options as she feels they have not had great experiences in the past, but is aware SNF is most  appropriate for current rehabilitation needs.     Pertinent Vitals/ Pain       Pain Assessment Pain Assessment: Faces Faces Pain Scale: Hurts a little bit Pain Location: general grimacing/moaning with mobility. Unable to state Pain Descriptors / Indicators: Discomfort Pain Intervention(s): Limited activity within patient's tolerance, Monitored during session, Repositioned  Home Living Family/patient expects to be discharged to:: Private residence Living Arrangements: Alone Available Help at Discharge: Family;Other (Comment) (Personal care aid 2 hr per day M-F) Type of Home: Apartment Home Access: Level entry     Home Layout: One level     Bathroom Shower/Tub: Tub/shower unit (Sister reports tub shower, pt reports handicap accessible, but unsure if poor historian)   Biochemist, clinical: Standard     Home Equipment: Conservation officer, nature (2 wheels);Shower seat;Wheelchair - manual          Prior Functioning/Environment              Frequency  Min 2X/week        Progress Toward Goals  OT Goals(current goals can now be found in the care plan section)  Progress towards OT goals: Progressing toward goals  Acute Rehab OT Goals Patient Stated Goal: Have some orange juice OT Goal Formulation: With patient Time For Goal Achievement: 12/26/21 Potential to Achieve Goals: [place] Discharge plan remains appropriate    Co-evaluation                 AM-PAC OT "6 Clicks" Daily Activity     Outcome Measure   Help from another person eating meals?: A Lot Help from another person taking care of personal grooming?: A Lot Help from another person toileting, which includes using toliet, bedpan, or urinal?: Total Help from another person bathing (including washing, rinsing, drying)?: A Lot Help from another person to put on and taking off regular upper body clothing?: A Lot Help from another person to put on and taking off regular lower body clothing?: Total 6 Click Score:  10    End of Session    OT Visit Diagnosis: Unsteadiness on feet (R26.81);Other abnormalities of gait and mobility (R26.89);Muscle weakness (generalized) (M62.81);Low vision, both eyes (H54.2);Other symptoms and signs involving cognitive function;Pain Pain - part of body:  (generalized. Pt unable to state)   Activity Tolerance Patient tolerated treatment well   Patient Left in bed;with call bell/phone within reach;with bed alarm set;with family/visitor present   Nurse Communication Mobility status        Time: 7096-2836 OT Time Calculation (min): 40 min  Charges: OT General Charges $OT Visit: 1 Visit OT Treatments $Self Care/Home Management : 23-37 mins $Therapeutic Activity: 8-22 mins  Shanda Howells, OTR/L [facility] Acute Rehabilitation Office: [phone]   Lula Olszewski 12/18/2021, 4:33 PM

## 2021-12-18 NOTE — Progress Notes (Signed)
Modified Barium Swallow Progress Note  Patient Details  Name: [name]. MRN: [id number] Date of Birth: 05-01-1965  Today's Date: 12/18/2021  Modified Barium Swallow completed.  Full report located under Chart Review in the Imaging Section.  Brief recommendations include the following:  Clinical Impression  Pt demonstrates mild-moderate oropharyngeal dysphagia marked by decreased oral containment, cohesion and bolus control leading to lingual and sublingual residue and uncoordinated propulsion. Mastication of regular was prolonged with delayed transit. Swallow initiation was briefly delayed to the pyriform sinuses with reduced anterior hyoid movement, tongue base retraction leading to penetration of thin, nectar (PAS 5, 3) and aspiration with thin barium (PAS 8). Pt was awake and slow to follow commands. Thin liquid pyriform sinus residue was penetrated to vocal cords without awareness. He could not effectively produce a chin tuck strategy. Recommending nectar thick and Dys 2 texture, no straws, crush pills and full assist with meals. He may benefit from brief oral hold before attempting to transit bolus.   Swallow Evaluation Recommendations       SLP Diet Recommendations: Dysphagia 2 (Fine chop) solids;Nectar thick liquid   Liquid Administration via: Cup;No straw   Medication Administration: Crushed with puree   Supervision: Staff to assist with self feeding   Compensations: Small sips/bites;Slow rate;Clear throat intermittently   Postural Changes: Seated upright at 90 degrees   Oral Care Recommendations: Oral care BID        [name] 12/18/2021,4:27 PM

## 2021-12-18 NOTE — Consult Note (Signed)
[facility] Face-to-Face Psychiatric Evaluation  Service Date: December 18, 2021 LOS:  LOS: 17 days   Assessment  [name]. is a 55 y.o. male admitted medically for 12/01/2021  4:50 PM for altered mental status after being found down, was found in pool of urine and feces after several days. 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  Psychiatry was consulted for possible catatonia by internal medicine primary team. Psych provided consult on 12/02/21, patient was unresponsive to all questions, had horizontal nystagmus - advised to hold all psychotropic medications given concern for polypharmacy  Initial presentation was concerning for benzodiazepine withdrawal given that he had been found down after several days and was known to have rx clonazepam, UDS in the ED was negative, klonipin does not always show up on UDS from ED. However, despite aggressive treatment and thorough workup, patient continued to remain minimally responsive. Per chart review there appears to have been some improvement over the course of hospitalization then subsequent decline. He has had recurrent fevers and desaturation with no identifiable cause, negative workup for stroke, bleeds, seizures, infectious causes during this admission with inconclusive LP, CSF findings consistent with inflammation. CT head notable for mild to moderate generalized cerebral atrophy that is more advanced than would be expected for patient's age. Discussed with primary team the possibility of malignant catatonia on 12/04/21, psychiatry recommended '2mg'$  ativan q6 which he has been getting  since then.  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 No real improvement seen by physician team after second dose (although some noted by nursing staff). On review of prior episodes of profound AMS, pt does seem to improve when restarted on antipsychotics; notably his home medications include aripiprazole (which is a D2 partial agonist; withdrawal from this agent has been known to precipitate catatonia, and Abilify is part of the algorithm for treatment resistant catatonia). Will need to monitor for any increase in rigidity and/or precipitation of NMS after we start this agent (less likely as not a D2 antagonist).   With negative medical workup so far, presentation strongly concerning for catatonia. Per Dr. Weber Cooks, patient is candidate for ECT. Will plan to discuss with sister [name] who is medical power of attorney r/b/se of ECT and determine best course with shared decision making. 12/16/2021 Sister gave consent to move forward with ECT.    Patient continues to improve, responding well to abilify 5 mg daily and sch ativan. Now verbally responsive and less dystonia. Given improvement, recommend no med changes 8/28. BFCS 14 (12/16/2021). Tapering off ativan due to daytime drowsiness and improving responsiveness.  Diagnoses:  Active Hospital problems: Principal Problem:   Encephalopathy acute Active Problems:   Schizoaffective disorder, bipolar type ([place])   Type 2 diabetes mellitus with stage 4 chronic kidney disease, with long-term current use of insulin (HCC)   Hypernatremia   Stage 2 skin ulcer of sacral region ([place])   Malnutrition of moderate degree   Aspiration pneumonia ([place])   [place] spotted fever   Plan  ## Safety and Observation Level:  - Based on my clinical  evaluation, I estimate the patient to be at low risk of self harm in the current setting - At this time, we  recommend a routine level of observation. This decision is based on my review of the chart including patient's history and current presentation, interview of the patient, mental status examination, and consideration of suicide risk including evaluating suicidal ideation, plan, intent, suicidal or self-harm behaviors, risk factors, and protective factors. This judgment is based on our ability to directly address suicide risk, implement suicide prevention strategies and develop a safety plan while the patient is in the clinical setting. Please contact our team if there is a concern that risk level has changed.  ## Medications:  -- TAPERED Ativan 2 mg QID > 2 mg TID -- Continued aripiprazole 5 mg  ## Medical Decision Making Capacity:  -- Sister [name] is health care power of attorney (have not personally reviewed documentation -- pt functionally lacks capacity to make own decisions at this time as he is nonverbal 2/2 psychiatric decompensation. Will need to be periodically reassessed.   ## Work-up:  -- Pertinent labwork reviewed earlier this admission includes:  - 8/21: CT head w/o contrast:  -IMPRESSION: No evidence of acute intracranial abnormality. Mild-to-moderate generalized cerebral atrophy, advanced for age.  - 8/15 EEG:  ABNORMALITY - Periodic discharges with triphasic morphology, generalized ( GPDs) - Continuous slow, generalized   IMPRESSION: This study is suggestive of moderate diffuse encephalopathy, most likely secondary to toxic-metabolic causes.  No seizures were seen throughout the recording.  Thank you for this consult request. Recommendations have been communicated to the primary team.  We will continue to follow at this time.   Merrily Brittle, DO  History  Relevant Aspects of Hospital Course:  Admitted on 12/01/2021 for fall, altered mental status. Has had significant workup; tends to have days where he will moan and say 1-2 words but is largely non-communicative and  non-interactive. There has been some marginal improvement over the past few days.   On exam today (12/18/2021) No family at bedside.   Patient was initially seen resting in hospital bed, awake, no acute distress. Patient was able to speak short 3-4 word sentences, would fall asleep intermittently. Requested water multiple times, discussed with patient that he is currently NPO and will need a swallow study first, planned for noon. Denied pain, CP. Patient had no other concerns or questions.  Psych ROS: Mood: Dysphoric Sleep: Poor Appetite: intact SI: No HI: No AVH: None  Ideas of Reference: None    Collateral information:  Talked to sister, [name] at [phone] [facility]). Obtained consent for ECT if needed.   Called sister. Had ~10-15 minute conversation with her on current diagnosis, current treatment, and potential for ECT. Described the procedure and risks and benefits briefly; explained it essentially as a higher risk procedure that is more likely to have benefit for her brother at this time lead to improvement in psychiatric symptoms. She feels that given recent trajectory, she would like to pursue this option. Formal informed consent will need to be done by physician performing the procedure, ideally after POA papers located.   She describes that he has had similar episodes to this in the past but never this bad, she believes they were associated with his use of opioid medications.  Psychiatric History:  Information collected from chart review  - Schizoaffective d/o - Substance use d/o - Prior suicide attempt by multidrug overdose - memory problems - possible dementia, diagnosis has been given and questioned.  PTA psych medications listed include welllbutrin, abilify, buspirone, clonazepam, depakote, gabapentin, oxycodone, seroquel, tizanidine, trazadone. Record of barriers to accessing medication and periods of non-compliance.  Per chart review other episodes where he  was "found down" with AMS resolved upon administration of home psychiatric med regimen.  Family psych history: unable to obtain.  Social History:  Per sister [name], patient has lived alone for 3 years. [name] had to leave, will plan to obtain more social and family history from her at a future time.   Family History:   The patient's family history includes Cancer in his mother; Cervical cancer in his sister; Diabetes in his father and sister.  Medical History: Past Medical History:  Diagnosis Date   ADD (attention deficit disorder)    Anxiety    Arthritis    right hip   Bipolar 1 disorder (HCC)    Blood in urine    CKD (chronic kidney disease), stage III (HCC)    Creatinine elevation    Dementia ([place])    "early onset" (08/04/2017)   Depression    bipolar [facility]   Diabetes mellitus without complication ([place])    Family history of anesthesia complication    pt is unsure , but pt father may have been difficult to arouse    HCAP (healthcare-associated pneumonia) 10/31/2012   History of kidney stones    Hypertension    Hypogonadism male    Liver fatty degeneration    Microscopic hematuria    hereditary s/p Urology eval   Neuromuscular disorder ([place])    feet neuropathy    Osteoarthritis of right hip 11/28/2011   2012 2015 s/p THR Severe  Dr Novella Olive     Pleural effusion 11/02/2012   Pneumonia 10-2012   Pneumonia, organism unspecified([place]) 11/02/2012   Polysubstance dependence, non-opioid, in remission ([place])    remote   Primary osteoarthritis of left hip 05/22/2015   PTSD (post-traumatic stress disorder)    SOCIAL ANXIETY DISORDER    Schizoaffective disorder ([place])    Substance abuse ([place])    Suicide attempt by multiple drug overdose ([place]) 01-07-16   Grieving his cat's death Jul 15, 2015    Surgical History: Past Surgical History:  Procedure Laterality Date   BACK SURGERY     CLOSED REDUCTION METACARPAL WITH PERCUTANEOUS PINNING Right    LUMBAR [place]      TOTAL HIP ARTHROPLASTY Right 08/16/2013   Procedure: TOTAL HIP ARTHROPLASTY ANTERIOR APPROACH;  Surgeon: Hessie Dibble, MD;  Location: [facility];  Service: Orthopedics;  Laterality: Right;   TOTAL HIP ARTHROPLASTY Left 05/22/2015   Procedure: TOTAL HIP ARTHROPLASTY ANTERIOR APPROACH;  Surgeon: Melrose Nakayama, MD;  Location: [facility];  Service: Orthopedics;  Laterality: Left;    Medications:   Current Facility-Administered Medications:    0.9 %  sodium chloride infusion, , Intravenous, Continuous, Gilles Chiquito B, MD, Last Rate: 75 mL/hr at 12/18/21 July 14, 2036, New Bag at 12/18/21 07-14-36   acetaminophen (TYLENOL) tablet 650 mg, 650 mg, Oral, Q6H PRN, Iona Beard, MD   Derrill Memo ON 12/19/2021] ARIPiprazole (ABILIFY) tablet 5 mg, 5 mg, Oral, Daily, Mapp, Tavien, MD   doxycycline (VIBRA-TABS) tablet 100 mg, 100 mg, Oral, Q12H, Mapp, Tavien, MD   enoxaparin (LOVENOX) injection 40 mg, 40 mg, Subcutaneous, Q24H, Hoffman, Erik C, DO, 40 mg at 12/18/21 0959   [START ON 9/56/3875] folic acid (FOLVITE) tablet 1 mg, 1 mg, Oral, Daily, Mapp, Tavien, MD   guaiFENesin (ROBITUSSIN) 100 MG/5ML liquid 15 mL, 15 mL,  Oral, Q4H PRN, Mapp, Tavien, MD   insulin aspart (novoLOG) injection 0-9 Units, 0-9 Units, Subcutaneous, Q4H, Lucious Groves, DO, 2 Units at 12/18/21 2034   insulin glargine-yfgn (SEMGLEE) injection 35 Units, 35 Units, Subcutaneous, Daily, Gaylan Gerold, DO, 35 Units at 12/18/21 0959   ipratropium-albuterol (DUONEB) 0.5-2.5 (3) MG/3ML nebulizer solution 3 mL, 3 mL, Nebulization, Q4H PRN, Jodell Cipro, Sriramkumar, MD, 3 mL at 12/14/21 2106   LORazepam (ATIVAN) injection 2 mg, 2 mg, Intravenous, TID, Merrily Brittle, DO, 2 mg at 12/18/21 1806   Oral care mouth rinse, 15 mL, Mouth Rinse, PRN, Lucious Groves, DO   Oral care mouth rinse, 15 mL, Mouth Rinse, 4 times per day, Lucious Groves, DO, 15 mL at 12/18/21 1807   pantoprazole (PROTONIX) EC tablet 40 mg, 40 mg, Oral, Daily, Iona Beard, MD, 40 mg at 12/18/21  1805   piperacillin-tazobactam (ZOSYN) IVPB 3.375 g, 3.375 g, Intravenous, Q8H, Katsadouros, Vasilios, MD, Last Rate: 12.5 mL/hr at 12/18/21 1815, 3.375 g at 12/18/21 1815   [START ON 12/19/2021] thiamine (VITAMIN B1) tablet 100 mg, 100 mg, Oral, Daily, Mapp, Tavien, MD  Allergies: Allergies  Allergen Reactions   Vicodin [Hydrocodone-Acetaminophen] Itching   Objective  Vital signs:  Temp:  [97.1 F (36.2 C)-99.4 F (37.4 C)] 98.7 F (37.1 C) (08/30 1930) Pulse Rate:  [93-97] 93 (08/30 1930) Resp:  [13-28] 16 (08/30 1930) BP: (122-144)/(86-96) 134/92 (08/30 1930) SpO2:  [96 %-97 %] 96 % (08/30 1930)  General Appearance: Disheveled   Eye Contact: Minimal   Speech: Slow   Volume:Decreased    Mood:Dysphoric  Affect:Congruent; Appropriate; Blunt; Depressed    Thought Process:Coherent; Linear; Goal Directed  Descriptions of Associations:Intact   Orientation:Partial   Thought Content:-- (Wanted water bc NPO)  Hallucinations:None   Ideas of Reference:None   Suicidal Thoughts:No  Homicidal Thoughts:No   Memory:Immediate Fair    Judgement:Impaired  Insight:Shallow    Psychomotor Activity:Other (comment) (increased RUE tone at elbow flexon)    Concentration:Poor  Attention Span:Fair (counted 10 to 1 - mild effort)  Recall:Poor    Fund of Knowledge:Fair    Language:Fair    Handed:Right    Assets:Desire for Improvement    Sleep:Poor       AIMS:0 CIWA:CIWA-Ar Total: 2   Physical Exam Vitals and nursing note reviewed.  Constitutional:      Appearance: He is ill-appearing. He is not diaphoretic.     Comments: Drowsy, fell asleep a few times during interview. Able to maintain alertness for a few mins before falling asleep.   Musculoskeletal:     Comments: Inc tone in RUE into flexion at elbow and wrist. LUE improving increased tone  Neurological:     Mental Status: He is alert.     Blood pressure (!) 134/92, pulse 93, temperature 98.7 F (37.1  C), temperature source Oral, resp. rate 16, height '6\' 3"'$  (1.905 m), weight 87.8 kg, SpO2 96 %. Body mass index is 24.19 kg/m.

## 2021-12-19 DIAGNOSIS — A770 Spotted fever due to Rickettsia rickettsii: Secondary | ICD-10-CM | POA: Diagnosis not present

## 2021-12-19 DIAGNOSIS — G9340 Encephalopathy, unspecified: Secondary | ICD-10-CM | POA: Diagnosis not present

## 2021-12-19 LAB — GLUCOSE, CAPILLARY
Glucose-Capillary: 113 mg/dL — ABNORMAL HIGH (ref 70–99)
Glucose-Capillary: 115 mg/dL — ABNORMAL HIGH (ref 70–99)
Glucose-Capillary: 121 mg/dL — ABNORMAL HIGH (ref 70–99)
Glucose-Capillary: 130 mg/dL — ABNORMAL HIGH (ref 70–99)
Glucose-Capillary: 133 mg/dL — ABNORMAL HIGH (ref 70–99)
Glucose-Capillary: 176 mg/dL — ABNORMAL HIGH (ref 70–99)
Glucose-Capillary: 179 mg/dL — ABNORMAL HIGH (ref 70–99)

## 2021-12-19 LAB — BASIC METABOLIC PANEL
Anion gap: 7 (ref 5–15)
BUN: 26 mg/dL — ABNORMAL HIGH (ref 6–20)
CO2: 23 mmol/L (ref 22–32)
Calcium: 9.5 mg/dL (ref 8.9–10.3)
Chloride: 106 mmol/L (ref 98–111)
Creatinine, Ser: 1.42 mg/dL — ABNORMAL HIGH (ref 0.61–1.24)
GFR, Estimated: 59 mL/min — ABNORMAL LOW (ref >60)
Glucose, Bld: 112 mg/dL — ABNORMAL HIGH (ref 70–99)
Potassium: 3.7 mmol/L (ref 3.5–5.1)
Sodium: 136 mmol/L (ref 135–145)

## 2021-12-19 LAB — CBC
HCT: 33.6 % — ABNORMAL LOW (ref 39.0–52.0)
Hemoglobin: 11.7 g/dL — ABNORMAL LOW (ref 13.0–17.0)
MCH: 29.2 pg (ref 26.0–34.0)
MCHC: 34.8 g/dL (ref 30.0–36.0)
MCV: 83.8 fL (ref 80.0–100.0)
Platelets: 296 10*3/uL (ref 150–400)
RBC: 4.01 MIL/uL — ABNORMAL LOW (ref 4.22–5.81)
RDW: 13.2 % (ref 11.5–15.5)
WBC: 6.8 10*3/uL (ref 4.0–10.5)
nRBC: 0 % (ref 0.0–0.2)

## 2021-12-19 MED ORDER — ADULT MULTIVITAMIN W/MINERALS CH
1.0000 | ORAL_TABLET | Freq: Every day | ORAL | Status: DC
  Administered 2021-12-19: 1 via ORAL
  Filled 2021-12-19: qty 1

## 2021-12-19 MED ORDER — POTASSIUM CHLORIDE CRYS ER 20 MEQ PO TBCR
20.0000 meq | EXTENDED_RELEASE_TABLET | Freq: Once | ORAL | Status: DC
Start: 2021-12-19 — End: 2021-12-19

## 2021-12-19 MED ORDER — NEPRO/CARBSTEADY PO LIQD: 237.0000 mL | Freq: Two times a day (BID) | ORAL | Status: DC

## 2021-12-19 MED ORDER — POTASSIUM CHLORIDE 20 MEQ PO PACK
20.0000 meq | PACK | Freq: Once | ORAL | Status: AC
Start: 2021-12-19 — End: 2021-12-19
  Administered 2021-12-19: 20 meq via ORAL
  Filled 2021-12-19: qty 1

## 2021-12-19 MED ORDER — TRAZODONE HCL 50 MG PO TABS
150.0000 mg | ORAL_TABLET | Freq: Every day | ORAL | Status: DC
  Administered 2021-12-19: 150 mg via ORAL
  Filled 2021-12-19: qty 1

## 2021-12-19 NOTE — NC FL2 (Signed)
[place] LEVEL OF CARE SCREENING TOOL     IDENTIFICATION  Patient Name: [name]. Birthdate: 1966-03-22 Sex: male Admission Date (Current Location): 12/01/2021  [facility] and [place] Number:  Herbalist and Address:  [facility], [place] [address] [place], [place] [address]      Provider Number: [id number]  Attending Physician Name and Address:  Sid Falcon, MD  Relative Name and Phone Number:       Current Level of Care: Hospital Recommended Level of Care: [place] Prior Approval Number:    Date Approved/Denied:   PASRR Number: Manual review  Discharge Plan: SNF    Current Diagnoses: Patient Active Problem List   Diagnosis Date Noted   Aspiration pneumonia ([place]) 12/15/2021   [facility] spotted fever 12/15/2021   Malnutrition of moderate degree 12/06/2021   Thyroid nodule 12/04/2021   Hypernatremia 12/04/2021   Stage 2 skin ulcer of sacral region ([place]) 12/04/2021   Diabetic ketoacidosis without coma associated with type 2 diabetes mellitus ([place])    Encephalopathy acute 12/01/2021   Type 2 diabetes mellitus with stage 4 chronic kidney disease, with long-term current use of insulin ([place]) 01/26/2021   Tachycardia 08/30/2020   Pressure injury of skin 01/02/2020   Dehydration    Rhabdomyolysis 01/01/2020   Recurrent falls 05/15/2019   AKI (acute kidney injury) ([place]) 05/14/2019   Closed displaced fracture of lateral malleolus of left fibula 08/02/2017   BPH (benign prostatic hyperplasia) 07/24/2017   Hemorrhoid 05/27/2017   Shoulder arthritis 05/27/2017   Diabetic neuropathy ([place]) 05/27/2017   Degenerative disc disease, lumbar 02/04/2017   Constipation    History of seizures 09/20/2016   Acute lower UTI    Urinary retention    HLD (hyperlipidemia) 09/07/2016   GERD (gastroesophageal reflux disease) 09/07/2016   PTSD (post-traumatic stress disorder) 09/07/2016   Fall 09/07/2016    Chronic kidney disease    Anorexia nervosa, restricting type, in full remission, moderate 01/05/2016   Ataxia 10/14/2015   Hypokalemia 10/03/2015   Hemiparesis ([place])    Dyslipidemia 05/31/2014   Insomnia 11/11/2013   Degenerative joint disease (DJD) of hip 08/16/2013   Acute midline low back pain without sciatica 05/20/2011   Erectile dysfunction 02/17/2011   Constipation - functional 02/17/2011   Hypogonadism male 05/20/2010   Demoralization and apathy 05/20/2010   Schizoaffective disorder, bipolar type ([place]) 01/18/2010   Right shoulder pain 01/18/2010   Anxiety state 12/05/2006   Essential hypertension 12/05/2006    Orientation RESPIRATION BLADDER Height & Weight     Self, Place  Normal Incontinent Weight: 184 lb 15.5 oz (83.9 kg) Height:  '6\' 3"'$  (190.5 cm)  BEHAVIORAL SYMPTOMS/MOOD NEUROLOGICAL BOWEL NUTRITION STATUS      Continent Diet (see DC summary)  AMBULATORY STATUS COMMUNICATION OF NEEDS Skin   Extensive Assist Verbally PU Stage and Appropriate Care, Other (Comment) (pressure injury, right heel: no dressing; left upper and middle back, foam dressing: lift every shift to assess, change PRN)   PU Stage 2 Dressing:  (right hip: foam dressing, lift every shift to assess and change PRN; right elbow, no dressing; sacrum, no dressing)                   Personal Care Assistance Level of Assistance  Bathing, Feeding, Dressing Bathing Assistance: Maximum assistance Feeding assistance: Limited assistance Dressing Assistance: Maximum assistance     Functional Limitations Info  Speech, Sight Sight Info: Impaired   Speech  Info: Impaired (delayed responses)    SPECIAL CARE FACTORS FREQUENCY  PT (By licensed PT), OT (By licensed OT), Speech therapy     PT Frequency: 5x/wk OT Frequency: 5x/wk     Speech Therapy Frequency: 5x/wk      Contractures Contractures Info: Not present    Additional Factors Info  Code Status, Allergies, Psychotropic, Insulin Sliding Scale  Code Status Info: DNR Allergies Info: Vicodin (Hydrocodone-acetaminophen) Psychotropic Info: see DC summary Insulin Sliding Scale Info: see DC summary       Current Medications (12/19/2021):  This is the current hospital active medication list Current Facility-Administered Medications  Medication Dose Route Frequency Provider Last Rate Last Admin   acetaminophen (TYLENOL) tablet 650 mg  650 mg Oral Q6H PRN Iona Beard, MD       ARIPiprazole (ABILIFY) tablet 5 mg  5 mg Oral Daily Mapp, Tavien, MD   5 mg at 12/19/21 0957   enoxaparin (LOVENOX) injection 40 mg  40 mg Subcutaneous Q24H Joni Reining C, DO   40 mg at 00/45/99 7741   folic acid (FOLVITE) tablet 1 mg  1 mg Oral Daily Mapp, Tavien, MD   1 mg at 12/19/21 0956   guaiFENesin (ROBITUSSIN) 100 MG/5ML liquid 15 mL  15 mL Oral Q4H PRN Mapp, Tavien, MD       insulin aspart (novoLOG) injection 0-9 Units  0-9 Units Subcutaneous Q4H Joni Reining C, DO   1 Units at 12/19/21 0809   insulin glargine-yfgn (SEMGLEE) injection 35 Units  35 Units Subcutaneous Daily Gaylan Gerold, DO   35 Units at 12/19/21 1002   ipratropium-albuterol (DUONEB) 0.5-2.5 (3) MG/3ML nebulizer solution 3 mL  3 mL Nebulization Q4H PRN Linus Galas, MD   3 mL at 12/14/21 2106   LORazepam (ATIVAN) injection 2 mg  2 mg Intravenous TID Merrily Brittle, DO   2 mg at 12/19/21 1004   Oral care mouth rinse  15 mL Mouth Rinse PRN Lucious Groves, DO       Oral care mouth rinse  15 mL Mouth Rinse 4 times per day Joni Reining C, DO   15 mL at 12/19/21 0804   pantoprazole (PROTONIX) EC tablet 40 mg  40 mg Oral Daily Iona Beard, MD   40 mg at 12/19/21 0956   piperacillin-tazobactam (ZOSYN) IVPB 3.375 g  3.375 g Intravenous Q8H Katsadouros, Vasilios, MD 12.5 mL/hr at 12/19/21 1008 3.375 g at 12/19/21 1008   thiamine (VITAMIN B1) tablet 100 mg  100 mg Oral Daily Mapp, Tavien, MD   100 mg at 12/19/21 4239     Discharge Medications: Please see discharge summary for a list  of discharge medications.  Relevant Imaging Results:  Relevant Lab Results:   Additional Information SS#: [id number]  Geralynn Ochs, LCSW

## 2021-12-19 NOTE — Progress Notes (Signed)
SLP Cancellation Note  Patient Details Name: [name]. MRN: [id number] DOB: Aug 26, 1965   Cancelled treatment:       Reason Eval/Treat Not Completed: Fatigue/lethargy limiting ability to participate. Patient sleeping soundly. RN requested that patient not be disturbed at this time.   Gabriel Rainwater MA, CCC-SLP    Karrissa Parchment Meryl 12/19/2021, 10:41 AM

## 2021-12-19 NOTE — Progress Notes (Signed)
Paged at 23:43 for increased delirium and restlessness. Visited the patient at bedside to evaluate.  Upon arrival, patient was attempting to leave his bed. He was oriented to neither person, place, nor time. Speech was slurred and tangential, but redirectable. He explained that he felt uncomfortable, so the head of his bed was raised, which seemed to help.  Ordered trazodone '150mg'$  for sleep. Patient reports taking this medication at home and he says that it is effective.   Roswell Nickel, MD Internal Medicine PGY-1 Pager [phone]

## 2021-12-19 NOTE — Progress Notes (Signed)
Asked CN tara for a sitter to stay with thept since pt is high risk for fall, CN Tara relayed that at this time no staff can be with  the pt and may  refer pt to doctor on call for possible restraint
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
## 2021-12-19 NOTE — Progress Notes (Signed)
Physical Therapy Treatment Patient Details Name: [name]. MRN: [id number] DOB: [birth year] Today's Date: 12/19/2021   History of Present Illness [name]. is a 56 y.o. male with PMHx of PTSD, bipolar disorder, schizoaffective disorder, medication-induced tremors, polypharmacy, T2DM, and HTN who presented to the ED on 12/01/2021 with altered mental status secondary to polypharmacy.    PT Comments    Patient improved with sitting balance able to sit several seconds with close S with UE support.  Also able to get on his feet x 5 reps for LE strength though could not get fully upright.  Scoot pivot to recliner, but uncomfortable so assisted back to bed with nursing.  Feel he remains appropriate for  SNF level rehab at d/c.   Recommendations for follow up therapy are one component of a multi-disciplinary discharge planning process, led by the attending physician.  Recommendations may be updated based on patient status, additional functional criteria and insurance authorization.  Follow Up Recommendations  Skilled nursing-short term rehab (<3 hours/day) Can patient physically be transported by private vehicle: No   Assistance Recommended at Discharge Frequent or constant Supervision/Assistance  Patient can return home with the following Two people to help with walking and/or transfers;Two people to help with bathing/dressing/bathroom;Direct supervision/assist for medications management;Direct supervision/assist for financial management;Assist for transportation;Help with stairs or ramp for entrance   Equipment Recommendations  Other (comment) (TBA)    Recommendations for Other Services       Precautions / Restrictions Precautions Precautions: Fall     Mobility  Bed Mobility Overal bed mobility: Needs Assistance Bed Mobility: Supine to Sit, Sit to Supine     Supine to sit: Mod assist Sit to supine: Mod assist, +2 for physical assistance   General bed mobility  comments: assist for lifting trunk; to supine assist for legs and trunk    Transfers Overall transfer level: Needs assistance Equipment used: Rolling walker (2 wheels) Transfers: Sit to/from Stand, Bed to chair/wheelchair/BSC Sit to Stand: Max assist, +2 physical assistance, From elevated surface     Squat pivot transfers: Mod assist, +2 physical assistance    Lateral/Scoot Transfers: Max assist, +2 physical assistance General transfer comment: "stood" on flexed legs from EOB at higher surface with +2 A x 5; scoot pivot to recliner from EOB at higher surface with drop arm.  Pt uncomfortable in chair so RN Assisted with pt attempted squat pivot, but pt not moving hips so scooted on bed pad to recliner +2 max A    Ambulation/Gait                   Stairs             Wheelchair Mobility    Modified Rankin (Stroke Patients Only)       Balance Overall balance assessment: Needs assistance Sitting-balance support: Feet supported, Bilateral upper extremity supported Sitting balance-Leahy Scale: Poor Sitting balance - Comments: min to mod A at times for balance on EOB with anterior bias, but once up and pt cued could self support several seconds with close S     Standing balance-Leahy Scale: Zero Standing balance comment: flexed knees and hips in standing                            Cognition Arousal/Alertness: Awake/alert Behavior During Therapy: Flat affect Overall Cognitive Status: Impaired/Different from baseline Area of Impairment: Orientation, Attention, Following commands, Safety/judgement, Awareness, Problem solving, Memory  Orientation Level: Disoriented to, Place, Time, Situation Current Attention Level: Focused Memory: Decreased short-term memory Following Commands: Follows one step commands inconsistently, Follows one step commands with increased time Safety/Judgement: Decreased awareness of deficits     General  Comments: confabulating about things then says just kidding        Exercises      General Comments General comments (skin integrity, edema, etc.): VSS      Pertinent Vitals/Pain Pain Assessment Faces Pain Scale: Hurts a little bit Pain Location: buttocks in sitting Pain Descriptors / Indicators: Grimacing Pain Intervention(s): Monitored during session, Repositioned    Home Living                          Prior Function            PT Goals (current goals can now be found in the care plan section) Progress towards PT goals: Progressing toward goals    Frequency    Min 2X/week      PT Plan Current plan remains appropriate    Co-evaluation              AM-PAC PT "6 Clicks" Mobility   Outcome Measure  Help needed turning from your back to your side while in a flat bed without using bedrails?: A Lot Help needed moving from lying on your back to sitting on the side of a flat bed without using bedrails?: A Lot Help needed moving to and from a bed to a chair (including a wheelchair)?: Total Help needed standing up from a chair using your arms (e.g., wheelchair or bedside chair)?: Total Help needed to walk in hospital room?: Total Help needed climbing 3-5 steps with a railing? : Total 6 Click Score: 8    End of Session Equipment Utilized During Treatment: Gait belt Activity Tolerance: Patient limited by fatigue Patient left: in bed;with call bell/phone within reach;with nursing/sitter in room   PT Visit Diagnosis: Unsteadiness on feet (R26.81);Muscle weakness (generalized) (M62.81);Difficulty in walking, not elsewhere classified (R26.2)     Time: 6073-7106 PT Time Calculation (min) (ACUTE ONLY): 39 min  Charges:  $Therapeutic Activity: 38-52 mins                     [name], PT Acute Rehabilitation Services [phone] 12/19/2021    Reginia Naas 12/19/2021, 2:54 PM

## 2021-12-19 NOTE — Progress Notes (Addendum)
Subjective:   Per nursing staff, the patient was trying to get out of bed last night and was concerned that a family member was trying to kill him. Our team reassessed the patient overnight and noted that he was confused and AxO x0 but redirectable. Trazodone 150 mg was given for sleep. Patient appears somnolent this morning.  Able to open his eyes and speak a few words in response to questioning but falls asleep shortly after.  Will come back and reassess the patient later today.  1:25 PM I went to reassess the patient and he is still mildly somnolent but more awake than this morning. Still only responding with a few words in each sentence.  He is oriented to self only.   Objective:  Vital signs in last 24 hours: Vitals:   12/18/21 1930 12/19/21 0000 12/19/21 0349 12/19/21 0500  BP: (!) 134/92 113/66 116/74   Pulse: 93 95    Resp: '16 15 20   '$ Temp: 98.7 F (37.1 C)  97.9 F (36.6 C)   TempSrc: Oral  Axillary   SpO2: 96% 96% 100%   Weight:    83.9 kg  Height:       Physical Exam: Constitutional: chronically ill-appearing, confused, appears somnolent. Eyes: conjunctiva non-erythematous.   Cardiovascular: regular rate and rhythm. No murmurs, rubs, or gallops.  Pulmonary: normal work of breathing on RA. No wheezes, rales, or rhonchi. Transmitted lung sounds present in all lung fields.  Abdominal: soft, non-distended. MSK: no LE swelling.  Neurological: alert & oriented to self only, only able to speak a few words but responding appropriately to some questions. Falls back asleep midway through conversation.  Skin: warm and dry. Well-healed abrasions of lower extremities. No pressure wounds noted. Psych: flat affect  Assessment/Plan:  Principal Problem:   Encephalopathy acute Active Problems:   Schizoaffective disorder, bipolar type (HCC)   Type 2 diabetes mellitus with stage 4 chronic kidney disease, with long-term current use of insulin (HCC)   Hypernatremia   Stage 2 skin  ulcer of sacral region ([place])   Malnutrition of moderate degree   Aspiration pneumonia ([place])   [facility] spotted fever  Fitzgerald Dunne is a 56 y/o patient with past medical history of PTSD, bipolar disorder, schizoaffective disorder, medication-induced tremors, and polypharmacy that presented with confusion and was admitted for acute encephalopathy likely secondary to polypharmacy and catatonia.     # Toxic metabolic encephalopathy # Hyperthermia # Acute hypoxic respiratory failure Likely secondary to polypharmacy versus catatonia.  Mentation remains roughly unchanged from yesterday although the patient is more somnolent. Trazodone 150 mg was given for sleep overnight. Given confusion overnight and somnolence this morning, patient was reassessed in the afternoon. Patient was less somnolent but still confused and only oriented to self. Patient was initially awaiting bed for transfer to [facility] for ECT per psych recommendation. Psych no longer recommends ECT given improvement of his mentation with abilify. Psych plans to slowly taper off ativan to prevent rebound catatonia. OT recommends OT in SNF. Will speak with social worker today about options for SNF.  - Ativan 2 mg TID - Abilify 5 mg daily   2. # [place] spotted fever IgM + Initially presented w/ fever, petechial rash of distal extremities, and confusion. Rash remains resolved. Likely contributing to his encephalopathy. Completed 5 day course of doxycycline on 8/30. WBC count remains normal at 6.8 today.  - Trend CBC   3. # Hospital Acquired Left lower lung aspiration pneumonia # Hypoxic respiratory  failure Coarse breath sounds noted previously. Opacity present CXR on 8/27. MRSA swab negative. BC negative remain negative. Aspiration was likely secondary to altered mentation. SpO2 remains > 95% on room air. Lungs sound good today with the exception of transmitted lung sounds throughout. - Zosyn day 5/7    4. # Enteral nutrition   # Malnutrition # Type 2 diabetes with hyperglycemia (A1C 8%) Patient pulled out core track on 8/29. Speech pathology recommended dysphagia 2 diet following results of DG swallow.  Morning glucose at 113. - Semglee 35 units with SSI - NovoLog 9 units q4h - Will reassess diet based on assessment of mentation and swallowing function   5. # Acute kidney injury on CKD Uncertain baseline creatinine. Cr improved to 1.42 today. BUN improved to 26 today. Received NS at 75 mL/hr for 10 hours last night.   - Trend BMP - IVF as necessary   6. # Normocytic anemia Hgb improved to 11.7 today. Likely secondary to hemodilution from tube feedings. - Trend CBC   7. # Pressure injuries Pressure injury to right elbow, right lower extremity, and right sacrum on admission.  - Continue rolling/wound management   8. # Thyroid nodule 1.5 cm incidental thyroid nodule on CT.  - Recommend outpatient thyroid US     9. # Hyponatremia Sodium remains normal at 136 today.  - Free water flush frequency decreased on 8/29   Diet: Dysphagia 2  IVF:  None VTE: Enoxaparin Code: DNR   Prior to Admission Living Arrangement: at home by himself Anticipated Discharge Location: SNF Barriers to Discharge: continued management Dispo: Anticipated discharge in approximately more than 2 day(s).   [name], MD 12/19/2021, 7:26 AM Pager: [phone] After 5pm on weekdays and 1pm on weekends: On Call pager [phone]

## 2021-12-19 NOTE — Progress Notes (Signed)
Seen by DR  Jodi Mourning, spoke with the pt, updated Dr Brandt Loosen about what pt was doing, pt was restless trying to go out of bed, still following simple command, but confused, Dr Jodi Mourning advised to do delirium protocol and will order trazodone to help pt sleep, pt claimed he was on same med with 150 mg, and it really help him to sleep

## 2021-12-19 NOTE — Progress Notes (Signed)
Nutrition Follow-up  DOCUMENTATION CODES:  Non-severe (moderate) malnutrition in context of social or environmental circumstances  INTERVENTION:  Continue DYS 2 diet per SLP Add Nepro Shake po BID until pt is cleared for thin liquids, each supplement provides 425 kcal and 19 grams protein MVI with minerals Magic cup TID with meals, each supplement provides 290 kcal and 9 grams of protein  NUTRITION DIAGNOSIS:  Moderate Malnutrition related to social / environmental circumstances (mental illness) as evidenced by mild muscle depletion, mild fat depletion.  GOAL:  Patient will meet greater than or equal to 90% of their needs  MONITOR:  Labs, Weight trends, Diet advancement  REASON FOR ASSESSMENT:  Consult Assessment of nutrition requirement/status  ASSESSMENT:  Pt with PMH of T2DM, CKD3, bipolar 1, schizoaffective disorder, substance use, and HTN presented to the ED after being found down at his home  8/18 - cortrak placed (pyloric region) 8/29 - pt pulled his cortrak out 8/30 - MBS, DYS2 diet with nectar thick liquids  Met with pt in room. Pt pulled out his cortrak tube but PO diet was able to be initiated yesterday. Pt awake and alert today which has not been the case previously. Intermittently answers questions.  Pt agreeable to receiving supplements, likes vanilla best.   Nutritionally Relevant Medications: Scheduled Meds:  folic acid  1 mg Oral Daily   insulin aspart  0-9 Units Subcutaneous Q4H   insulin glargine-yfgn  35 Units Subcutaneous Daily   pantoprazole  40 mg Oral Daily   thiamine  100 mg Oral Daily   Continuous Infusions:  piperacillin-tazobactam (ZOSYN)  IV 3.375 g (12/19/21 1008)   Labs Reviewed: BUN 26, creatinine 1.42 CBG ranges from 98-167 mg/dL over the last 24 hours HgbA1c 8.0% (8/14)  NUTRITION - FOCUSED PHYSICAL EXAM: Flowsheet Row Most Recent Value  Orbital Region Mild depletion  Upper Arm Region Mild depletion  Thoracic and Lumbar Region  Mild depletion  Buccal Region Mild depletion  Temple Region Moderate depletion  Clavicle Bone Region Mild depletion  Clavicle and Acromion Bone Region Mild depletion  Scapular Bone Region Mild depletion  Dorsal Hand No depletion  Patellar Region No depletion  Anterior Thigh Region No depletion  Posterior Calf Region Mild depletion  Edema (RD Assessment) None  Hair Reviewed  Eyes Reviewed  Mouth Reviewed  Skin Reviewed  Nails Reviewed   Diet Order:   Diet Order             DIET DYS 2 Room service appropriate? Yes; Fluid consistency: Nectar Thick  Diet effective now                   EDUCATION NEEDS:  Not appropriate for education at this time  Skin:  Skin Assessment: Skin Integrity Issues: Skin Integrity Issues:: Stage II Stage II: right hip, right elbow, right sacrum  Last BM:  8/31 - type 5  Height:  Ht Readings from Last 1 Encounters:  12/02/21 6' 3"  (1.905 m)    Weight:  Wt Readings from Last 1 Encounters:  12/19/21 83.9 kg    Ideal Body Weight:  89.1 kg  BMI:  Body mass index is 23.12 kg/m.  Estimated Nutritional Needs:  Kcal:  2400-2600 kcal/d Protein:  120-140g/d Fluid:  2.4-2.6L/d    Ranell Patrick, RD, LDN Clinical Dietitian RD pager # available in [facility]  After hours/weekend pager # available in [facility]

## 2021-12-19 NOTE — Progress Notes (Signed)
Name: [name], [name]  DOB: 1965/04/29  Please be advised that the above-named patient will require a short-term nursing home stay -- anticipated 30 days or less for rehabilitation and strengthening. The plan is for return home.

## 2021-12-19 NOTE — Consult Note (Addendum)
I have independently evaluated the patient during a face-to-face assessment on 8/31. I reviewed the patient's chart, and I participated in key portions of the service. I discussed the case with the [employer], and I agree with the assessment and plan of care as documented in the House Officer's note.   I have made some fairly significant edits to the note below to better reflect the pt's status and plan discussed - notably we rescinded rec to tx to [facility] on 8/31 which was discussed with primary team and Dr. Weber Cooks.  On 9/1, was contacted by primary team [facility] transport showed up. As [facility] has ECT capability if pt's catatonia worsens, does not continue to improve as expected, and additionally the BMU unit can take much sicker patients than the psychiatry unit in [place], did not block transfer to [facility] despite rescinding the recommendation for ECT on 8/31 as [facility] will be better equipped to deal with any potential decompensation. I am removing BFCS scores from 8/28 and 8/31 (prev 14 and 10) from the body of the note as these were not discussed with me - clinically there was significant improvement between 8/24 (when I first saw patient) and 8/31.    Jeral Fruit, MD   [facility] Face-to-Face Psychiatric Evaluation  Service Date: December 19, 2021 LOS:  LOS: 18 days   Assessment  Jonathan Leib. is a 56 y.o. male admitted medically for 12/01/2021  4:50 PM for altered mental status after being found down, was found in pool of urine and feces after several days. 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  Psychiatry was  consulted for possible catatonia by internal medicine primary team. Psych provided consult on 12/02/21. At that time patient was unresponsive to all questions, had horizontal nystagmus - advised to hold all psychotropic medications given concern for polypharmacy  Initial presentation was concerning for benzodiazepine withdrawal given that he had been found down after several days and was known to have rx clonazepam, UDS in the ED was negative, however klonopin does not always show up on UDS from ED. However, despite aggressive treatment and thorough workup, patient continued to remain minimally responsive. Per chart review there appears to have been some marginal improvement over this phase of hospitalization then subsequent decline. He has had recurrent fevers and desaturation with no identifiable cause, negative workup for stroke, bleeds, seizures, infectious causes during this admission with inconclusive LP, CSF findings consistent with inflammation. CT head notable for mild to moderate generalized cerebral atrophy that is more advanced than would be expected for patient's age. Discussed with primary team the possibility of malignant catatonia on 12/04/21, psychiatry recommended '2mg'$  ativan q6 which he had some response to.  We did a formal BFRS prior to and after receiving 2 mg of ativan x2; he showed some improvement after the first (but not the second) dose confirming dx of catatonia.  Around this time we reached out to sister to get assent to move forward with ECT and contacted Dr. Weber Cooks at [facility]. On review of prior episodes of profound AMS, pt does seem to improve when restarted on antipsychotics; notably his home medications include aripiprazole (which is a D2 partial agonist; withdrawal from  this agent has been known to precipitate catatonia, and Abilify is part of the algorithm for treatment resistant catatonia). For these reasons abilify was started on 8/25. At some point he tested + for RMSF, which is not  a known cause of catatonia to my knowledge but ( Will need to monitor for any increase in rigidity and/or precipitation of NMS after we start this agent (less likely as not a D2 antagonist). He did have a fairly good response to abilify; towards last few days of hospital stay would turn his head, track movement of others, and answered questions (sometimes appropriately). We as a team rescinded the recommendation to transfer to [facility] on this date given steady improvement on abilify (although pt not at/near baseline); had also d/w medicine team beginning to re-engage PT and OT as much of the long-term morbidity from catatonia is more related to muscle loss and contractures than the condition itself.     Patient continues to improve, responding well to abilify 5 mg daily and sch ativan. Now verbally responsive and less rigidity. Given improvement, recommend no med changes 8/28 holding off on further decreasing ativan at this time to prevent rebound catatonia, daytime sedation improved. Would likely taper by ~20% weekly as long as sx continue to be stable or improve.   Diagnoses:  Active Hospital problems: Principal Problem:   Encephalopathy acute Active Problems:   Schizoaffective disorder, bipolar type ([place])   Type 2 diabetes mellitus with stage 4 chronic kidney disease, with long-term current use of insulin (HCC)   Hypernatremia   Stage 2 skin ulcer of sacral region ([place])   Malnutrition of moderate degree   Aspiration pneumonia ([place])   Rocky [place] spotted fever   Plan  ## Safety and Observation Level:  - Based on my clinical evaluation, I estimate the patient to be at moderate risk of self harm in the current setting - At this time, we recommend a routine level of observation. This decision is based on my review of the chart including patient's history and current presentation, interview of the patient, mental status examination, and consideration of suicide risk including evaluating suicidal  ideation, plan, intent, suicidal or self-harm behaviors, risk factors, and protective factors. This judgment is based on our ability to directly address suicide risk, implement suicide prevention strategies and develop a safety plan while the patient is in the clinical setting. Please contact our team if there is a concern that risk level has changed.  ## Medications:  -- Continued Ativan 2 mg TID, plan to taper slowly -- Continued aripiprazole 5 mg  ## Medical Decision Making Capacity:  -- Sister [name] is health care power of attorney (have not personally reviewed documentation -- pt functionally lacks capacity to make own decisions at this time as he is nonverbal 2/2 psychiatric decompensation. Will need to be periodically reassessed.   ## Work-up:  -- Pertinent labwork reviewed earlier this admission includes:  - 8/21: CT head w/o contrast:  -IMPRESSION: No evidence of acute intracranial abnormality. Mild-to-moderate generalized cerebral atrophy, advanced for age.  - 8/15 EEG:  ABNORMALITY - Periodic discharges with triphasic morphology, generalized ( GPDs) - Continuous slow, generalized   IMPRESSION: This study is suggestive of moderate diffuse encephalopathy, most likely secondary to toxic-metabolic causes.  No seizures were seen throughout the recording.  Thank you for this consult request. Recommendations have been communicated to the primary team.  We will continue to follow at this time.   Merrily Brittle, DO  History  Relevant Aspects  of Hospital Course:  Admitted on 12/01/2021 for fall, altered mental status. Has had significant workup; tends to have days where he will moan and say 1-2 words but is largely non-communicative and non-interactive. There has been some marginal improvement over the past few days.   Last 24h: patient was seen OOB, delirious, requiring prn trazodone.   On exam today (12/19/2021) No family at bedside.   Patient was seen this AM, awake, no acute  distress. Oriented to self only, poor attention. Speech was mumbling in quality, incoherent, nonsensical. At times would be able to answer concrete questions.   Attending addendum: Patient had talked about wanting to eat on this evaluation, which was new. Overall interfaced much better with team. At times (brief intervals) did smile and display much more emotion.    Psych ROS: Mood:  ("good") Sleep: Poor Appetite: intact SI: No HI: No AVH: None  Ideas of Reference: None    Collateral information:  Talked to sister, [name] at [phone] [facility]). Obtained consent for ECT if needed.   Called sister. Had ~10-15 minute conversation with her on current diagnosis, current treatment, and potential for ECT. Described the procedure and risks and benefits briefly; explained it essentially as a higher risk procedure that is more likely to have benefit for her brother at this time lead to improvement in psychiatric symptoms. She feels that given recent trajectory, she would like to pursue this option. Formal informed consent will need to be done by physician performing the procedure, ideally after POA papers located.   She describes that he has had similar episodes to this in the past but never this bad, she believes they were associated with his use of opioid medications.  Psychiatric History:  Information collected from chart review  - Schizoaffective d/o - Substance use d/o - Prior suicide attempt by multidrug overdose - memory problems - possible dementia, diagnosis has been given and questioned.   PTA psych medications listed include welllbutrin, abilify, buspirone, clonazepam, depakote, gabapentin, oxycodone, seroquel, tizanidine, trazadone. Record of barriers to accessing medication and periods of non-compliance.  Per chart review other episodes where he was "found down" with AMS resolved upon administration of home psychiatric med regimen.  Family psych history: unable to  obtain.  Social History:  Per sister [name], patient has lived alone for 3 years. [name] had to leave, will plan to obtain more social and family history from her at a future time.   Family History:   The patient's family history includes Cancer in his mother; Cervical cancer in his sister; Diabetes in his father and sister.  Medical History: Past Medical History:  Diagnosis Date   ADD (attention deficit disorder)    Anxiety    Arthritis    right hip   Bipolar 1 disorder (HCC)    Blood in urine    CKD (chronic kidney disease), stage III (HCC)    Creatinine elevation    Dementia ([place])    "early onset" (08/04/2017)   Depression    bipolar [facility]   Diabetes mellitus without complication ([place])    Family history of anesthesia complication    pt is unsure , but pt father may have been difficult to arouse    HCAP (healthcare-associated pneumonia) 10/31/2012   History of kidney stones    Hypertension    Hypogonadism male    Liver fatty degeneration    Microscopic hematuria    hereditary s/p Urology eval   Neuromuscular disorder ([place])    feet neuropathy  Osteoarthritis of right hip 11/28/2011   2012 2015 s/p THR Severe  Dr Novella Olive     Pleural effusion 11/02/2012   Pneumonia 10-2012   Pneumonia, organism unspecified([place]) 11/02/2012   Polysubstance dependence, non-opioid, in remission ([place])    remote   Primary osteoarthritis of left hip 05/22/2015   PTSD (post-traumatic stress disorder)    SOCIAL ANXIETY DISORDER    Schizoaffective disorder ([place])    Substance abuse ([place])    Suicide attempt by multiple drug overdose ([place]) 2016/01/15   Grieving his cat's death 07-23-15    Surgical History: Past Surgical History:  Procedure Laterality Date   BACK SURGERY     CLOSED REDUCTION METACARPAL WITH PERCUTANEOUS PINNING Right    LUMBAR [place] HIP ARTHROPLASTY Right 08/16/2013   Procedure: TOTAL HIP ARTHROPLASTY ANTERIOR APPROACH;  Surgeon: Hessie Dibble, MD;  Location: [place];  Service: Orthopedics;  Laterality: Right;   TOTAL HIP ARTHROPLASTY Left 05/22/2015   Procedure: TOTAL HIP ARTHROPLASTY ANTERIOR APPROACH;  Surgeon: Melrose Nakayama, MD;  Location: [place];  Service: Orthopedics;  Laterality: Left;    Medications:   Current Facility-Administered Medications:    acetaminophen (TYLENOL) tablet 650 mg, 650 mg, Oral, Q6H PRN, Iona Beard, MD   ARIPiprazole (ABILIFY) tablet 5 mg, 5 mg, Oral, Daily, Mapp, Tavien, MD, 5 mg at 12/19/21 0957   enoxaparin (LOVENOX) injection 40 mg, 40 mg, Subcutaneous, Q24H, Hoffman, Erik C, DO, 40 mg at 49/17/91 5056   folic acid (FOLVITE) tablet 1 mg, 1 mg, Oral, Daily, Mapp, Tavien, MD, 1 mg at 12/19/21 0956   guaiFENesin (ROBITUSSIN) 100 MG/5ML liquid 15 mL, 15 mL, Oral, Q4H PRN, Mapp, Tavien, MD   insulin aspart (novoLOG) injection 0-9 Units, 0-9 Units, Subcutaneous, Q4H, Hoffman, Rachel Moulds, DO, 1 Units at 12/19/21 1137   insulin glargine-yfgn (SEMGLEE) injection 35 Units, 35 Units, Subcutaneous, Daily, Gaylan Gerold, DO, 35 Units at 12/19/21 1002   ipratropium-albuterol (DUONEB) 0.5-2.5 (3) MG/3ML nebulizer solution 3 mL, 3 mL, Nebulization, Q4H PRN, Jodell Cipro, Sriramkumar, MD, 3 mL at 12/14/21 July 22, 2104   LORazepam (ATIVAN) injection 2 mg, 2 mg, Intravenous, TID, Merrily Brittle, DO, 2 mg at 12/19/21 1004   Oral care mouth rinse, 15 mL, Mouth Rinse, PRN, Lucious Groves, DO   Oral care mouth rinse, 15 mL, Mouth Rinse, 4 times per day, Joni Reining C, DO, 15 mL at 12/19/21 1134   pantoprazole (PROTONIX) EC tablet 40 mg, 40 mg, Oral, Daily, Iona Beard, MD, 40 mg at 12/19/21 0956   piperacillin-tazobactam (ZOSYN) IVPB 3.375 g, 3.375 g, Intravenous, Q8H, Katsadouros, Vasilios, MD, Last Rate: 12.5 mL/hr at 12/19/21 1008, 3.375 g at 12/19/21 1008   thiamine (VITAMIN B1) tablet 100 mg, 100 mg, Oral, Daily, Mapp, Tavien, MD, 100 mg at 12/19/21 9794  Allergies: Allergies  Allergen Reactions   Vicodin  [Hydrocodone-Acetaminophen] Itching   Objective  Vital signs:  BP (!) 134/90 (BP Location: Left Arm)   Pulse 90   Temp 98 F (36.7 C) (Oral)   Resp 14   Ht '6\' 3"'$  (1.905 m)   Wt 83.9 kg   SpO2 95%   BMI 23.12 kg/m   General Appearance: Disheveled   Eye Contact: Poor   Speech: Slow; Blocked; Slurred; Garbled   Volume: Decreased    Mood: -- ("good")  Affect: Appropriate; Congruent; Constricted    Thought Process: Disorganized (thought blocking)  Descriptions of Associations: Loose   Orientation: None   Thought  Content: Illogical; Scattered  Hallucinations: None  Ideas of Reference: None   Suicidal Thoughts: No  Homicidal Thoughts: No   Memory: Immediate Fair    Judgement: Impaired  Insight: Shallow    Psychomotor Activity: Other (comment)    Concentration: Poor  Attention Span: Poor  Recall: Poor    Fund of Knowledge: Poor    Language: Poor    Handed: Right    Assets: Desire for Improvement    Sleep: Poor    CIWA:CIWA-Ar Total: 2      Physical Exam Vitals and nursing note reviewed.  Constitutional:      General: He is not in acute distress.    Appearance: He is ill-appearing. He is not toxic-appearing.     Interventions: Nasal cannula in place.  HENT:     Head: Normocephalic.  Pulmonary:     Effort: Respiratory distress present.  Neurological:     Mental Status: He is alert.  Psychiatric:        Behavior: Behavior is cooperative.

## 2021-12-19 NOTE — Plan of Care (Signed)

## 2021-12-20 ENCOUNTER — Inpatient Hospital Stay
Admission: AD | Admit: 2021-12-20 | Discharge: 2022-01-03 | DRG: 091 | Disposition: A | Discharge status: Home Health | Payer: Medicare Other | Source: Other Acute Inpatient Hospital | Attending: Internal Medicine | Admitting: Internal Medicine

## 2021-12-20 DIAGNOSIS — E1169 Type 2 diabetes mellitus with other specified complication: Secondary | ICD-10-CM | POA: Diagnosis not present

## 2021-12-20 DIAGNOSIS — Z96643 Presence of artificial hip joint, bilateral: Secondary | ICD-10-CM | POA: Diagnosis present

## 2021-12-20 DIAGNOSIS — A770 Spotted fever due to Rickettsia rickettsii: Secondary | ICD-10-CM | POA: Diagnosis not present

## 2021-12-20 DIAGNOSIS — E871 Hypo-osmolality and hyponatremia: Secondary | ICD-10-CM | POA: Diagnosis not present

## 2021-12-20 DIAGNOSIS — Z87891 Personal history of nicotine dependence: Secondary | ICD-10-CM | POA: Diagnosis not present

## 2021-12-20 DIAGNOSIS — Z833 Family history of diabetes mellitus: Secondary | ICD-10-CM

## 2021-12-20 DIAGNOSIS — F0393 Unspecified dementia, unspecified severity, with mood disturbance: Secondary | ICD-10-CM | POA: Diagnosis present

## 2021-12-20 DIAGNOSIS — D631 Anemia in chronic kidney disease: Secondary | ICD-10-CM | POA: Diagnosis present

## 2021-12-20 DIAGNOSIS — I129 Hypertensive chronic kidney disease with stage 1 through stage 4 chronic kidney disease, or unspecified chronic kidney disease: Secondary | ICD-10-CM | POA: Diagnosis present

## 2021-12-20 DIAGNOSIS — D638 Anemia in other chronic diseases classified elsewhere: Secondary | ICD-10-CM | POA: Diagnosis not present

## 2021-12-20 DIAGNOSIS — N1831 Chronic kidney disease, stage 3a: Secondary | ICD-10-CM

## 2021-12-20 DIAGNOSIS — R531 Weakness: Secondary | ICD-10-CM | POA: Diagnosis not present

## 2021-12-20 DIAGNOSIS — J9601 Acute respiratory failure with hypoxia: Secondary | ICD-10-CM | POA: Diagnosis present

## 2021-12-20 DIAGNOSIS — M6282 Rhabdomyolysis: Secondary | ICD-10-CM | POA: Diagnosis present

## 2021-12-20 DIAGNOSIS — F319 Bipolar disorder, unspecified: Secondary | ICD-10-CM | POA: Diagnosis present

## 2021-12-20 DIAGNOSIS — N179 Acute kidney failure, unspecified: Secondary | ICD-10-CM | POA: Diagnosis not present

## 2021-12-20 DIAGNOSIS — N184 Chronic kidney disease, stage 4 (severe): Secondary | ICD-10-CM | POA: Diagnosis present

## 2021-12-20 DIAGNOSIS — K219 Gastro-esophageal reflux disease without esophagitis: Secondary | ICD-10-CM | POA: Diagnosis present

## 2021-12-20 DIAGNOSIS — Z794 Long term (current) use of insulin: Secondary | ICD-10-CM | POA: Diagnosis not present

## 2021-12-20 DIAGNOSIS — E1122 Type 2 diabetes mellitus with diabetic chronic kidney disease: Secondary | ICD-10-CM | POA: Diagnosis present

## 2021-12-20 DIAGNOSIS — E1140 Type 2 diabetes mellitus with diabetic neuropathy, unspecified: Secondary | ICD-10-CM | POA: Diagnosis present

## 2021-12-20 DIAGNOSIS — E781 Pure hyperglyceridemia: Secondary | ICD-10-CM | POA: Diagnosis not present

## 2021-12-20 DIAGNOSIS — E041 Nontoxic single thyroid nodule: Secondary | ICD-10-CM | POA: Diagnosis present

## 2021-12-20 DIAGNOSIS — G928 Other toxic encephalopathy: Secondary | ICD-10-CM | POA: Diagnosis not present

## 2021-12-20 DIAGNOSIS — D696 Thrombocytopenia, unspecified: Secondary | ICD-10-CM | POA: Diagnosis present

## 2021-12-20 DIAGNOSIS — E1110 Type 2 diabetes mellitus with ketoacidosis without coma: Secondary | ICD-10-CM | POA: Diagnosis present

## 2021-12-20 DIAGNOSIS — N189 Chronic kidney disease, unspecified: Secondary | ICD-10-CM | POA: Diagnosis present

## 2021-12-20 DIAGNOSIS — L8990 Pressure ulcer of unspecified site, unspecified stage: Secondary | ICD-10-CM | POA: Diagnosis present

## 2021-12-20 DIAGNOSIS — Z66 Do not resuscitate: Secondary | ICD-10-CM | POA: Diagnosis present

## 2021-12-20 DIAGNOSIS — F4310 Post-traumatic stress disorder, unspecified: Secondary | ICD-10-CM | POA: Diagnosis present

## 2021-12-20 DIAGNOSIS — F988 Other specified behavioral and emotional disorders with onset usually occurring in childhood and adolescence: Secondary | ICD-10-CM | POA: Diagnosis present

## 2021-12-20 DIAGNOSIS — Z885 Allergy status to narcotic agent status: Secondary | ICD-10-CM

## 2021-12-20 DIAGNOSIS — J690 Pneumonitis due to inhalation of food and vomit: Secondary | ICD-10-CM | POA: Diagnosis not present

## 2021-12-20 DIAGNOSIS — N400 Enlarged prostate without lower urinary tract symptoms: Secondary | ICD-10-CM | POA: Diagnosis present

## 2021-12-20 DIAGNOSIS — E785 Hyperlipidemia, unspecified: Secondary | ICD-10-CM | POA: Diagnosis not present

## 2021-12-20 DIAGNOSIS — F0390 Unspecified dementia without behavioral disturbance: Secondary | ICD-10-CM

## 2021-12-20 DIAGNOSIS — F202 Catatonic schizophrenia: Secondary | ICD-10-CM | POA: Diagnosis present

## 2021-12-20 DIAGNOSIS — Z8049 Family history of malignant neoplasm of other genital organs: Secondary | ICD-10-CM

## 2021-12-20 DIAGNOSIS — Z79899 Other long term (current) drug therapy: Secondary | ICD-10-CM

## 2021-12-20 DIAGNOSIS — Z808 Family history of malignant neoplasm of other organs or systems: Secondary | ICD-10-CM

## 2021-12-20 DIAGNOSIS — M1990 Unspecified osteoarthritis, unspecified site: Secondary | ICD-10-CM | POA: Diagnosis present

## 2021-12-20 DIAGNOSIS — G9340 Encephalopathy, unspecified: Secondary | ICD-10-CM | POA: Diagnosis not present

## 2021-12-20 LAB — CBC
HCT: 34.4 % — ABNORMAL LOW (ref 39.0–52.0)
HCT: 33.8 % — ABNORMAL LOW (ref 39.0–52.0)
Hemoglobin: 11.5 g/dL — ABNORMAL LOW (ref 13.0–17.0)
Hemoglobin: 11.4 g/dL — ABNORMAL LOW (ref 13.0–17.0)
MCH: 28.5 pg (ref 26.0–34.0)
MCH: 27.9 pg (ref 26.0–34.0)
MCHC: 33.4 g/dL (ref 30.0–36.0)
MCHC: 33.7 g/dL (ref 30.0–36.0)
MCV: 85.1 fL (ref 80.0–100.0)
MCV: 82.6 fL (ref 80.0–100.0)
Platelets: 272 10*3/uL (ref 150–400)
Platelets: 284 10*3/uL (ref 150–400)
RBC: 4.04 MIL/uL — ABNORMAL LOW (ref 4.22–5.81)
RBC: 4.09 MIL/uL — ABNORMAL LOW (ref 4.22–5.81)
RDW: 13.6 % (ref 11.5–15.5)
RDW: 13.6 % (ref 11.5–15.5)
WBC: 5.8 10*3/uL (ref 4.0–10.5)
WBC: 5.7 10*3/uL (ref 4.0–10.5)
nRBC: 0 % (ref 0.0–0.2)
nRBC: 0 % (ref 0.0–0.2)

## 2021-12-20 LAB — BASIC METABOLIC PANEL
Anion gap: 6 (ref 5–15)
BUN: 21 mg/dL — ABNORMAL HIGH (ref 6–20)
CO2: 23 mmol/L (ref 22–32)
Calcium: 9.3 mg/dL (ref 8.9–10.3)
Chloride: 108 mmol/L (ref 98–111)
Creatinine, Ser: 1.37 mg/dL — ABNORMAL HIGH (ref 0.61–1.24)
GFR, Estimated: >60 mL/min (ref >60)
Glucose, Bld: 117 mg/dL — ABNORMAL HIGH (ref 70–99)
Potassium: 4 mmol/L (ref 3.5–5.1)
Sodium: 137 mmol/L (ref 135–145)

## 2021-12-20 LAB — CREATININE, SERUM
Creatinine, Ser: 1.27 mg/dL — ABNORMAL HIGH (ref 0.61–1.24)
GFR, Estimated: >60 mL/min (ref >60)

## 2021-12-20 LAB — GLUCOSE, CAPILLARY
Glucose-Capillary: 114 mg/dL — ABNORMAL HIGH (ref 70–99)
Glucose-Capillary: 149 mg/dL — ABNORMAL HIGH (ref 70–99)
Glucose-Capillary: 166 mg/dL — ABNORMAL HIGH (ref 70–99)
Glucose-Capillary: 145 mg/dL — ABNORMAL HIGH (ref 70–99)

## 2021-12-20 LAB — AMMONIA: Ammonia: 21 umol/L (ref 9–35)

## 2021-12-20 MED ORDER — LORAZEPAM 2 MG/ML IJ SOLN
2.0000 mg | Freq: Three times a day (TID) | INTRAMUSCULAR | Status: DC
Start: 2021-12-20 — End: 2021-12-24
  Administered 2021-12-20 – 2021-12-24 (×12): 2 mg via INTRAVENOUS
  Filled 2021-12-20 (×12): qty 1

## 2021-12-20 MED ORDER — NEPRO/CARBSTEADY PO LIQD: 237.0000 mL | Freq: Two times a day (BID) | ORAL | 0 refills | Status: DC

## 2021-12-20 MED ORDER — IPRATROPIUM-ALBUTEROL 0.5-2.5 (3) MG/3ML IN SOLN: 3.0000 mL | RESPIRATORY_TRACT | Status: DC | PRN

## 2021-12-20 MED ORDER — HYDRALAZINE HCL 20 MG/ML IJ SOLN: 10.0000 mg | INTRAMUSCULAR | Status: DC | PRN

## 2021-12-20 MED ORDER — LORAZEPAM 2 MG/ML IJ SOLN: 2.0000 mg | Freq: Three times a day (TID) | INTRAMUSCULAR | 0 refills | Status: DC

## 2021-12-20 MED ORDER — TRAZODONE HCL 50 MG PO TABS
50.0000 mg | ORAL_TABLET | Freq: Every evening | ORAL | Status: DC | PRN
  Administered 2021-12-20 – 2022-01-02 (×13): 50 mg via ORAL
  Filled 2021-12-20 (×14): qty 1

## 2021-12-20 MED ORDER — DM-GUAIFENESIN ER 30-600 MG PO TB12: 1.0000 | ORAL_TABLET | Freq: Two times a day (BID) | ORAL | Status: DC | PRN

## 2021-12-20 MED ORDER — PIPERACILLIN-TAZOBACTAM 3.375 G IVPB: 3.3750 g | Freq: Three times a day (TID) | INTRAVENOUS | Status: DC

## 2021-12-20 MED ORDER — FENOFIBRATE 160 MG PO TABS
160.0000 mg | ORAL_TABLET | Freq: Every day | ORAL | Status: DC
  Administered 2021-12-20: 160 mg via ORAL
  Filled 2021-12-20 (×2): qty 1

## 2021-12-20 MED ORDER — ARIPIPRAZOLE 5 MG PO TABS: 5.0000 mg | ORAL_TABLET | Freq: Every day | ORAL | Status: DC

## 2021-12-20 MED ORDER — THIAMINE HCL 100 MG/ML IJ SOLN
500.0000 mg | INTRAVENOUS | Status: AC
  Administered 2021-12-20 – 2021-12-24 (×5): 500 mg via INTRAVENOUS
  Filled 2021-12-20 (×5): qty 5

## 2021-12-20 MED ORDER — SENNOSIDES-DOCUSATE SODIUM 8.6-50 MG PO TABS: 1.0000 | ORAL_TABLET | Freq: Every evening | ORAL | Status: DC | PRN

## 2021-12-20 MED ORDER — PIPERACILLIN-TAZOBACTAM 3.375 G IVPB
3.3750 g | Freq: Three times a day (TID) | INTRAVENOUS | Status: AC
  Administered 2021-12-20 – 2021-12-21 (×3): 3.375 g via INTRAVENOUS
  Filled 2021-12-20 (×3): qty 50

## 2021-12-20 MED ORDER — METOPROLOL TARTRATE 5 MG/5ML IV SOLN: 5.0000 mg | INTRAVENOUS | Status: DC | PRN

## 2021-12-20 MED ORDER — ATORVASTATIN CALCIUM 10 MG PO TABS
10.0000 mg | ORAL_TABLET | Freq: Every day | ORAL | Status: DC
  Administered 2021-12-20: 10 mg via ORAL
  Filled 2021-12-20: qty 1

## 2021-12-20 MED ORDER — INSULIN ASPART 100 UNIT/ML IJ SOLN: 9.0000 [IU] | Freq: Three times a day (TID) | INTRAMUSCULAR | Status: DC

## 2021-12-20 MED ORDER — ENOXAPARIN SODIUM 40 MG/0.4ML IJ SOSY
40.0000 mg | PREFILLED_SYRINGE | INTRAMUSCULAR | Status: DC
Start: 2021-12-20 — End: 2021-12-24
  Administered 2021-12-20 – 2021-12-23 (×4): 40 mg via SUBCUTANEOUS
  Filled 2021-12-20 (×4): qty 0.4

## 2021-12-20 MED ORDER — INSULIN GLARGINE-YFGN 100 UNIT/ML ~~LOC~~ SOLN
35.0000 [IU] | Freq: Every day | SUBCUTANEOUS | Status: DC
Start: 2021-12-20 — End: 2022-01-02
  Administered 2021-12-20 – 2022-01-01 (×13): 35 [IU] via SUBCUTANEOUS
  Filled 2021-12-20 (×15): qty 0.35

## 2021-12-20 MED ORDER — INSULIN ASPART 100 UNIT/ML IJ SOLN
0.0000 [IU] | Freq: Three times a day (TID) | INTRAMUSCULAR | Status: DC
  Administered 2021-12-21: 5 [IU] via SUBCUTANEOUS
  Administered 2021-12-21: 3 [IU] via SUBCUTANEOUS
  Administered 2021-12-22: 8 [IU] via SUBCUTANEOUS
  Administered 2021-12-22 – 2021-12-23 (×2): 3 [IU] via SUBCUTANEOUS
  Administered 2021-12-23: 5 [IU] via SUBCUTANEOUS
  Administered 2021-12-24 (×3): 3 [IU] via SUBCUTANEOUS
  Administered 2021-12-25 (×2): 2 [IU] via SUBCUTANEOUS
  Administered 2021-12-26: 3 [IU] via SUBCUTANEOUS
  Administered 2021-12-26 – 2021-12-30 (×7): 2 [IU] via SUBCUTANEOUS
  Administered 2021-12-30: 3 [IU] via SUBCUTANEOUS
  Administered 2021-12-31 – 2022-01-01 (×3): 2 [IU] via SUBCUTANEOUS
  Administered 2022-01-01: 3 [IU] via SUBCUTANEOUS
  Administered 2022-01-01 – 2022-01-02 (×2): 2 [IU] via SUBCUTANEOUS
  Filled 2021-12-20 (×26): qty 1

## 2021-12-20 MED ORDER — INSULIN GLARGINE-YFGN 100 UNIT/ML ~~LOC~~ SOLN: 35.0000 [IU] | Freq: Every day | SUBCUTANEOUS | 11 refills | Status: DC

## 2021-12-20 MED ORDER — NICOTINE 21 MG/24HR TD PT24
21.0000 mg | MEDICATED_PATCH | Freq: Every day | TRANSDERMAL | Status: DC
  Administered 2021-12-20 – 2021-12-31 (×12): 21 mg via TRANSDERMAL
  Filled 2021-12-20 (×15): qty 1

## 2021-12-20 MED ORDER — ACETAMINOPHEN 325 MG PO TABS
650.0000 mg | ORAL_TABLET | Freq: Four times a day (QID) | ORAL | Status: DC | PRN
  Administered 2021-12-20 – 2022-01-02 (×5): 650 mg via ORAL
  Filled 2021-12-20 (×5): qty 2

## 2021-12-20 MED ORDER — ARIPIPRAZOLE 10 MG PO TABS
5.0000 mg | ORAL_TABLET | Freq: Every day | ORAL | Status: DC
  Administered 2021-12-20 – 2021-12-26 (×6): 5 mg via ORAL
  Filled 2021-12-20 (×9): qty 1

## 2021-12-20 NOTE — Progress Notes (Signed)
This RN called [facility] to get a report for parient, patient was transported by [facility] , per [facility] RN in [place], she thought [facility] was the RN.

## 2021-12-20 NOTE — Progress Notes (Signed)
Report called to  Receiving RN at [facility]. Patient sent with iv site intact transported via ambulance.

## 2021-12-20 NOTE — Discharge Summary (Signed)
Subjective:   Patient is not reporting any complaints today. Still only responding with a few words in each sentence. He is oriented to self only. Discussed plan for transfer to [place] for psych to reassess need for ECT.   Objective:  Vital signs in last 24 hours: Vitals:   12/19/21 2029 12/20/21 0000 12/20/21 0400 12/20/21 0500  BP:  121/85 127/79   Pulse: 87 82 81   Resp: '12 14 18   '$ Temp:  98.2 F (36.8 C) 97.6 F (36.4 C)   TempSrc:  Rectal Axillary   SpO2: 97% 96% 96%   Weight:    85.9 kg  Height:       Physical Exam: Constitutional: chronically ill-appearing, confused. Eyes: conjunctiva non-erythematous.   Cardiovascular: regular rate and rhythm. No murmurs, rubs, or gallops.  Pulmonary: normal work of breathing on RA. No wheezes, rales, or rhonchi. Transmitted lung sounds present in all lung fields.  Abdominal: soft, non-distended. MSK: no LE swelling.  Neurological: alert & oriented to self only, only able to speak a few words but responding appropriately to some questions.  Skin: warm and dry. Well-healed abrasions of lower extremities. No pressure wounds noted. Psych: flat affect  Assessment/Plan:  Principal Problem:   Encephalopathy acute Active Problems:   Schizoaffective disorder, bipolar type (HCC)   Type 2 diabetes mellitus with stage 4 chronic kidney disease, with long-term current use of insulin (HCC)   Hypernatremia   Stage 2 skin ulcer of sacral region ([place])   Malnutrition of moderate degree   Aspiration pneumonia ([place])   [facility] spotted fever  [name] is a 56 y/o patient with past medical history of PTSD, bipolar disorder, schizoaffective disorder, medication-induced tremors, and polypharmacy that presented with confusion and was admitted for acute encephalopathy likely secondary to polypharmacy and catatonia.     # Toxic metabolic encephalopathy # Hyperthermia # Acute hypoxic respiratory failure Likely secondary to polypharmacy  versus catatonia.  Mentation remains roughly unchanged from yesterday although the patient is more somnolent. Patient was less somnolent but still confused and only oriented to self. Due to long wait for transfer to [place] for ECT, psych was comfortable sending patient to SNF. Psych plans to slowly taper off ativan to prevent rebound catatonia. OT recommends OT once patient is sent to SNF. Transport services to [facility] arrived this morning and patient will be transferred for psych reassessment for need of ECT.  - Ativan 2 mg TID - Abilify 5 mg daily   2. # Rocky mountain spotted fever IgM + Initially presented w/ fever, petechial rash of distal extremities, and confusion. Rash remains resolved. Likely contributing to his encephalopathy. Completed 5 day course of doxycycline on 8/30. WBC count normal at 5.8 today.  - Trend CBC   3. # Hospital Acquired Left lower lung aspiration pneumonia # Hypoxic respiratory failure Coarse breath sounds noted early in hospital stay. Opacity present CXR on 8/27. MRSA swab negative. BC negative remain negative. Aspiration was likely secondary to altered mentation. SpO2 remains > 95% on room air. Lungs sound good today with the exception of transmitted lung sounds throughout. - IV Zosyn 3.375 g day 6/7    4. # Enteral nutrition  # Malnutrition # Type 2 diabetes with hyperglycemia (A1C 8%) Patient pulled out core track on 8/29. Speech pathology recommended dysphagia 2 diet following results of DG swallow.  Morning glucose at 113. - Semglee 35 units with SSI - NovoLog 9 units q4h - Will reassess diet based on assessment of  mentation and swallowing function   5. # Acute kidney injury on CKD Uncertain baseline creatinine. Cr improved to 1.37 today. BUN improved to 21 today.  - Trend BMP - IVF as necessary   6. # Normocytic anemia Hgb stable at 11.5 today. Likely secondary to hemodilution from tube feedings. - Trend CBC   7. # Pressure injuries Pressure  injury to right elbow, right lower extremity, and right sacrum on admission.  - Continue rolling/wound management   8. # Thyroid nodule 1.5 cm incidental thyroid nodule on CT.  - Recommend outpatient thyroid US     9. # Hyponatremia Sodium remains normal today at 137.  - Free water flush frequency decreased on 8/29   Diet: Dysphagia 2  IVF:  None VTE: Enoxaparin Code: DNR   Prior to Admission Living Arrangement: at home by himself Anticipated Discharge Location: SNF Barriers to Discharge: continued management Dispo: Anticipated discharge in approximately more than 2 day(s).   [name], MD 12/20/2021, 5:52 AM Pager: [phone] After 5pm on weekdays and 1pm on weekends: On Call pager [phone]

## 2021-12-20 NOTE — H&P (Signed)
History and Physical    [name]. WER:[id number] DOB: 26-May-1965 DOA: 12/20/2021  PCP: [name], MD Patient coming from: Transfer from [facility]  Chief Complaint: Catatonia  HPI: Shadd Dunstan. is a 56 y.o. male with medical history significant of bipolar 1 disorder, schizoaffective, polysubstance abuse, DM 2, CKD stage II was brought to the hospital as he was found unresponsive laying in feces and urine at home.  Patient was last seen normal around 8/11 but when EMS checked up on the patient he was laying at home unresponsive covered in urine and feces.  He was brought to the hospital where initially noted to be encephalopathic, DKA, AKI and rhabdomyolysis.  Slowly with fluid and insulin DKA, AKI and rhabdomyolysis resolved.  He was later also diagnosed with [facility] spotted fever for which she received 5 days of doxycycline and hospital course complicated by aspiration pneumonia requiring IV Zosyn.  His mentation was thought secondary to due to underlying psych issue as his MRI and EEG was negative.  Rest of the metabolic work-up was also negative.  Psychiatry evaluated the patient and recommended Ativan 2 mg 3 times daily with slow taper and Abilify for.  Case was discussed by their provider with psychiatry at [facility] Dr Weber Cooks with plans to transfer him here for ECT.  When I saw the patient here he was medically overall stable but only alert to his name.  Does not carry on any meaningful conversation and does not provide much of history.  He continues to ask me if he can go outside to smoke a cigarette.    Past Medical History:  Diagnosis Date   ADD (attention deficit disorder)    Anxiety    Arthritis    right hip   Bipolar 1 disorder (HCC)    Blood in urine    CKD (chronic kidney disease), stage III (HCC)    Creatinine elevation    Dementia ([place])    "early onset" (08/04/2017)   Depression    bipolar [facility]   Diabetes  mellitus without complication ([place])    Family history of anesthesia complication    pt is unsure , but pt father may have been difficult to arouse    HCAP (healthcare-associated pneumonia) 10/31/2012   History of kidney stones    Hypertension    Hypogonadism male    Liver fatty degeneration    Microscopic hematuria    hereditary s/p Urology eval   Neuromuscular disorder ([place])    feet neuropathy    Osteoarthritis of right hip 11/28/2011   2012 2015 s/p THR Severe  Dr Novella Olive     Pleural effusion 11/02/2012   Pneumonia 10-2012   Pneumonia, organism unspecified([place]) 11/02/2012   Polysubstance dependence, non-opioid, in remission ([place])    remote   Primary osteoarthritis of left hip 05/22/2015   PTSD (post-traumatic stress disorder)    SOCIAL ANXIETY DISORDER    Schizoaffective disorder ([place])    Substance abuse ([place])    Suicide attempt by multiple drug overdose ([place]) 2016/01/25   Grieving his cat's death 08/02/15    Past Surgical History:  Procedure Laterality Date   BACK SURGERY     CLOSED REDUCTION METACARPAL WITH PERCUTANEOUS PINNING Right    LUMBAR [place] HIP ARTHROPLASTY Right 08/16/2013   Procedure: TOTAL HIP ARTHROPLASTY ANTERIOR APPROACH;  Surgeon: Hessie Dibble, MD;  Location: [place];  Service: Orthopedics;  Laterality: Right;  TOTAL HIP ARTHROPLASTY Left 05/22/2015   Procedure: TOTAL HIP ARTHROPLASTY ANTERIOR APPROACH;  Surgeon: Melrose Nakayama, MD;  Location: [place];  Service: Orthopedics;  Laterality: Left;    SOCIAL HISTORY:  reports that he quit smoking about 2 years ago. His smoking use included cigarettes. He has never used smokeless tobacco. He reports that he does not currently use alcohol. He reports that he does not use drugs.  Allergies  Allergen Reactions   Vicodin [Hydrocodone-Acetaminophen] Itching    FAMILY HISTORY: Family History  Problem Relation Age of Onset   Diabetes Father    Cancer Mother        died of melanoma with  mets   Cervical cancer Sister    Diabetes Sister    Other Neg Hx        hypogonadism   Colon cancer Neg Hx    Colon polyps Neg Hx    Esophageal cancer Neg Hx    Rectal cancer Neg Hx    Stomach cancer Neg Hx      Prior to Admission medications   Medication Sig Start Date End Date Taking? Authorizing Provider  ARIPiprazole (ABILIFY) 5 MG tablet Take 1 tablet (5 mg total) by mouth daily. 12/20/21   Iona Beard, MD  atorvastatin (LIPITOR) 10 MG tablet Take 1 tablet (10 mg total) by mouth at bedtime. 11/19/21   [name], MD  Continuous Blood Gluc Receiver (FREESTYLE LIBRE READER) DEVI Use as directed 07/20/20   [name], MD  Continuous Blood Gluc Sensor (FREESTYLE LIBRE 14 DAY SENSOR) MISC Use as directed 01/22/21   [name], MD  Continuous Blood Gluc Sensor (FREESTYLE LIBRE 2 SENSOR) MISC by Does not apply route.    [provider]  fenofibrate (TRICOR) 145 MG tablet TAKE 1 TABLET BY MOUTH DAILY Patient taking differently: Take 145 mg by mouth daily. 04/28/21   [name], MD  glucose blood (ACCU-CHEK GUIDE) test strip CHECK SUGAR FOUR TIMES DAILY 07/22/18   [name], MD  insulin glargine-yfgn (SEMGLEE) 100 UNIT/ML injection Inject 0.35 mLs (35 Units total) into the skin daily. 12/20/21   Iona Beard, MD  Insulin Pen Needle (B-D UF III MINI PEN NEEDLES) 31G X 5 MM MISC USE 4 TIMES DAILY 07/09/21   [name], MD  LORazepam (ATIVAN) 2 MG/ML injection Inject 1 mL (2 mg total) into the vein 3 (three) times daily. 12/20/21   Iona Beard, MD  naloxone [facility]) nasal spray 4 mg/0.1 mL Place 1 spray into the nose daily as needed (For overdose). 10/25/21   [provider]  Nutritional Supplements (FEEDING SUPPLEMENT, NEPRO CARB STEADY,) LIQD Take 237 mLs by mouth 2 (two) times daily between meals. 12/20/21   Iona Beard, MD  piperacillin-tazobactam (ZOSYN) 3.375 GM/50ML IVPB Inject 50 mLs (3.375 g total) into the vein every 8 (eight) hours for 1 day.  12/20/21 12/21/21  Iona Beard, MD    Physical Exam: There were no vitals filed for this visit.    Constitutional: NAD, calm, comfortable Eyes: PERRL, lids and conjunctivae normal ENMT: Mucous membranes are moist. Posterior pharynx clear of any exudate or lesions.Normal dentition.  Neck: normal, supple, no masses, no thyromegaly Respiratory: Mild rhonchi bilaterally especially at the bases Cardiovascular: Regular rate and rhythm, no murmurs / rubs / gallops. No extremity edema. 2+ pedal pulses. No carotid bruits.  Abdomen: no tenderness, no masses palpated. No hepatosplenomegaly. Bowel sounds positive.  Musculoskeletal: no clubbing / cyanosis. No joint deformity upper and lower extremities. Good ROM, no contractures. Normal  muscle tone.  Skin: no rashes, lesions, ulcers. No induration Neurologic: He is alert only to name.  No other focal neurodeficits. Psychiatric: Poor judgment and insight.  His thoughts are very tangential but overall cooperative.     Labs on Admission: I have personally reviewed following labs and imaging studies  CBC: Recent Labs  Lab 12/15/21 0659 12/17/21 0353 12/18/21 0959 12/19/21 0402 12/20/21 0610 12/20/21 1300  WBC 6.2 7.2 6.9 6.8 5.8 5.7  NEUTROABS 4.2  --   --   --   --   --   HGB 10.5* 11.3* 11.5* 11.7* 11.5* 11.4*  HCT 31.8* 32.4* 34.0* 33.6* 34.4* 33.8*  MCV 85.9 82.7 84.4 83.8 85.1 82.6  PLT 303 306 312 296 272 852   Basic Metabolic Panel: Recent Labs  Lab 12/15/21 0659 12/17/21 0353 12/18/21 0959 12/19/21 0402 12/20/21 0610 12/20/21 1300  NA 140 133* 137 136 137  --   K 4.6 3.6 4.1 3.7 4.0  --   CL 109 102 105 106 108  --   CO2 21* '23 22 23 23  '$ --   GLUCOSE 158* 140* 165* 112* 117*  --   BUN 40* 32* 30* 26* 21*  --   CREATININE 1.49* 1.38* 1.47* 1.42* 1.37* 1.27*  CALCIUM 9.1 9.1 9.3 9.5 9.3  --   MG  --  2.0  --   --   --   --   PHOS  --  4.3  --   --   --   --    GFR: Estimated Creatinine Clearance: 79.5 mL/min (A) (by  C-G formula based on SCr of 1.27 mg/dL (H)). Liver Function Tests: Recent Labs  Lab 12/14/21 0327 12/15/21 0659  AST 27 30  ALT 22 24  ALKPHOS 62 60  BILITOT 0.4 0.5  PROT 6.1* 6.4*  ALBUMIN 2.1* 2.2*   No results for input(s): "LIPASE", "AMYLASE" in the last 168 hours. Recent Labs  Lab 12/20/21 1300  AMMONIA 21   Coagulation Profile: No results for input(s): "INR", "PROTIME" in the last 168 hours. Cardiac Enzymes: No results for input(s): "CKTOTAL", "CKMB", "CKMBINDEX", "TROPONINI" in the last 168 hours. BNP (last 3 results) No results for input(s): "PROBNP" in the last 8760 hours. HbA1C: No results for input(s): "HGBA1C" in the last 72 hours. CBG: Recent Labs  Lab 12/19/21 1542 12/19/21 1932 12/19/21 2334 12/20/21 0403 12/20/21 0803  GLUCAP 179* 176* 121* 114* 149*   Lipid Profile: No results for input(s): "CHOL", "HDL", "LDLCALC", "TRIG", "CHOLHDL", "LDLDIRECT" in the last 72 hours. Thyroid Function Tests: No results for input(s): "TSH", "T4TOTAL", "FREET4", "T3FREE", "THYROIDAB" in the last 72 hours. Anemia Panel: No results for input(s): "VITAMINB12", "FOLATE", "FERRITIN", "TIBC", "IRON", "RETICCTPCT" in the last 72 hours. Urine analysis:    Component Value Date/Time   COLORURINE YELLOW 12/08/2021 1815   APPEARANCEUR CLEAR 12/08/2021 1815   LABSPEC 1.010 12/08/2021 1815   PHURINE 7.5 12/08/2021 1815   GLUCOSEU 100 (A) 12/08/2021 1815   GLUCOSEU >=1000 (A) 06/02/2016 0844   HGBUR MODERATE (A) 12/08/2021 1815   BILIRUBINUR NEGATIVE 12/08/2021 1815   KETONESUR NEGATIVE 12/08/2021 1815   PROTEINUR 100 (A) 12/08/2021 1815   UROBILINOGEN 0.2 06/02/2016 0844   NITRITE NEGATIVE 12/08/2021 1815   LEUKOCYTESUR NEGATIVE 12/08/2021 1815   Sepsis Labs: !!!!!!!!!!!!!!!!!!!!!!!!!!!!!!!!!!!!!!!!!!!! '@LABRCNTIP'$ (procalcitonin:4,lacticidven:4) ) Recent Results (from the past 240 hour(s))  Resp Panel by RT-PCR (Flu A&B, Covid) Anterior Nasal Swab     Status: None    Collection Time: 12/11/21  6:30 PM  Specimen: Anterior Nasal Swab  Result Value Ref Range Status   SARS Coronavirus 2 by RT PCR NEGATIVE NEGATIVE Final    Comment: (NOTE) SARS-CoV-2 target nucleic acids are NOT DETECTED.  The SARS-CoV-2 RNA is generally detectable in upper respiratory specimens during the acute phase of infection. The lowest concentration of SARS-CoV-2 viral copies this assay can detect is 138 copies/mL. A negative result does not preclude SARS-Cov-2 infection and should not be used as the sole basis for treatment or other patient management decisions. A negative result may occur with  improper specimen collection/handling, submission of specimen other than nasopharyngeal swab, presence of viral mutation(s) within the areas targeted by this assay, and inadequate number of viral copies(<138 copies/mL). A negative result must be combined with clinical observations, patient history, and epidemiological information. The expected result is Negative.  Fact Sheet for Patients:  EntrepreneurPulse.com.au  Fact Sheet for Healthcare Providers:  IncredibleEmployment.be  This test is no t yet approved or cleared by the [place] FDA and  has been authorized for detection and/or diagnosis of SARS-CoV-2 by FDA under an Emergency Use Authorization (EUA). This EUA will remain  in effect (meaning this test can be used) for the duration of the COVID-19 declaration under Section 564(b)(1) of the Act, 21 U.S.C.section 360bbb-3(b)(1), unless the authorization is terminated  or revoked sooner.       Influenza A by PCR NEGATIVE NEGATIVE Final   Influenza B by PCR NEGATIVE NEGATIVE Final    Comment: (NOTE) The Xpert Xpress SARS-CoV-2/FLU/RSV plus assay is intended as an aid in the diagnosis of influenza from Nasopharyngeal swab specimens and should not be used as a sole basis for treatment. Nasal washings and aspirates are unacceptable for  Xpert Xpress SARS-CoV-2/FLU/RSV testing.  Fact Sheet for Patients: EntrepreneurPulse.com.au  Fact Sheet for Healthcare Providers: IncredibleEmployment.be  This test is not yet approved or cleared by the [place] FDA and has been authorized for detection and/or diagnosis of SARS-CoV-2 by FDA under an Emergency Use Authorization (EUA). This EUA will remain in effect (meaning this test can be used) for the duration of the COVID-19 declaration under Section 564(b)(1) of the Act, 21 U.S.C. section 360bbb-3(b)(1), unless the authorization is terminated or revoked.  Performed at [facility], [place] [address], [place], [place] [address]   Respiratory (~20 pathogens) panel by PCR     Status: None   Collection Time: 12/11/21  6:30 PM   Specimen: Nasopharyngeal Swab; Respiratory  Result Value Ref Range Status   Adenovirus NOT DETECTED NOT DETECTED Final   Coronavirus 229E NOT DETECTED NOT DETECTED Final    Comment: (NOTE) The Coronavirus on the Respiratory Panel, DOES NOT test for the novel  Coronavirus (2019 nCoV)    Coronavirus HKU1 NOT DETECTED NOT DETECTED Final   Coronavirus NL63 NOT DETECTED NOT DETECTED Final   Coronavirus OC43 NOT DETECTED NOT DETECTED Final   Metapneumovirus NOT DETECTED NOT DETECTED Final   Rhinovirus / Enterovirus NOT DETECTED NOT DETECTED Final   Influenza A NOT DETECTED NOT DETECTED Final   Influenza B NOT DETECTED NOT DETECTED Final   Parainfluenza Virus 1 NOT DETECTED NOT DETECTED Final   Parainfluenza Virus 2 NOT DETECTED NOT DETECTED Final   Parainfluenza Virus 3 NOT DETECTED NOT DETECTED Final   Parainfluenza Virus 4 NOT DETECTED NOT DETECTED Final   Respiratory Syncytial Virus NOT DETECTED NOT DETECTED Final   Bordetella pertussis NOT DETECTED NOT DETECTED Final   Bordetella Parapertussis NOT DETECTED NOT DETECTED Final  Chlamydophila pneumoniae NOT DETECTED NOT DETECTED Final   Mycoplasma pneumoniae  NOT DETECTED NOT DETECTED Final    Comment: Performed at [facility], [place] [address] [place], [place] [address]  Culture, blood (Routine X 2) w Reflex to ID Panel     Status: None   Collection Time: 12/11/21 11:09 PM   Specimen: BLOOD RIGHT HAND  Result Value Ref Range Status   Specimen Description BLOOD RIGHT HAND  Final   Special Requests   Final    BOTTLES DRAWN AEROBIC AND ANAEROBIC Blood Culture adequate volume   Culture   Final    NO GROWTH 5 DAYS Performed at [facility], [place] [address], [place], [place] [address]    Report Status 12/16/2021 FINAL  Final  Culture, blood (Routine X 2) w Reflex to ID Panel     Status: None   Collection Time: 12/11/21 11:10 PM   Specimen: BLOOD RIGHT WRIST  Result Value Ref Range Status   Specimen Description BLOOD RIGHT WRIST  Final   Special Requests   Final    BOTTLES DRAWN AEROBIC AND ANAEROBIC Blood Culture adequate volume   Culture   Final    NO GROWTH 5 DAYS Performed at [facility], [place] [address], [place], [place] [address]    Report Status 12/16/2021 FINAL  Final     Radiological Exams on Admission: No results found.   All images have been reviewed by me personally.  EKG: previous ones review no acute ST T CHANGES  Assessment/Plan Principal Problem:   Toxic metabolic encephalopathy Active Problems:   Dyslipidemia   HLD (hyperlipidemia)   GERD (gastroesophageal reflux disease)   Chronic kidney disease   BPH (benign prostatic hyperplasia)   Type 2 diabetes mellitus with stage 4 chronic kidney disease, with long-term current use of insulin (HCC)    Toxic metabolic encephalopathy - Unclear exact etiology.  Concerns about polypharmacy versus catatonia.  Patient is only alert to self but per documentation he appears to have made slow improvement.  Patient seen by psychiatry team at [facility] who discussed case with psychiatry at [facility] and recommended may benefit from ECT.  Psychiatry team will be  consulted here but at this time per Dr Weber Cooks no plans for ECT? But will let him weigh in.  -Continue Ativan 2 mg 3 times daily and Abilify 5 mg 3 times daily - Repeat ammonia levels here are normal.  Previously was elevated. - MRI brain 8/13-poor quality but no acute pathology.  TSH, RPR, UA overall unremarkable..  EEG-negative. -We will check B12, folate.  We will place him on high-dose thiamine  [facility] spotted fever - Elevated IgM.  Completed 5 days of doxycycline on 8/30.  Aspiration pneumonia - Opacity previously seen on chest x-ray on 8/27.  He was started on IV Zosyn.  Last day 9/2 to complete 7-day course.  I will also order bronchodilators as necessary.  Mucolytic's.  Acute kidney injury Rhabdomyolysis -Resolved  Anemia of chronic disease -Hemoglobin currently appears to be stable.  Hemoglobin stable at 11-12.  Thyroid nodule - Outpatient ultrasound  Speech and swallow-recommended D2 diet PT/OT = SNF   DVT prophylaxis: Lovenox Code Status: Full  Family Communication: None Consults called: Psych, Dr Weber Cooks Admission status: Inpatient Admit  Status is: Inpatient Remains inpatient appropriate because: Needs possible ECT per Psych.    Time Spent: 65 minutes.  >50% of the time was devoted to discussing the patients care, assessment, plan and disposition  with other care givers along with counseling the patient about the risks and benefits of treatment.    Quashaun Lazalde Arsenio Loader MD [facility]  If 7PM-7AM, please contact night-coverage   12/20/2021, 2:00 PM

## 2021-12-20 NOTE — Plan of Care (Signed)

## 2021-12-20 NOTE — Consult Note (Signed)
[facility] Consult   Reason for Consult: Follow-up on patient for consideration of catatonia treatment Referring Physician:  Reesa Chew Patient Identification: [name]. MRN:  [id number] Principal Diagnosis: Toxic metabolic encephalopathy Diagnosis:  Principal Problem:   Toxic metabolic encephalopathy Active Problems:   Dyslipidemia   HLD (hyperlipidemia)   GERD (gastroesophageal reflux disease)   Chronic kidney disease   BPH (benign prostatic hyperplasia)   Type 2 diabetes mellitus with stage 4 chronic kidney disease, with long-term current use of insulin ([place])   Total Time spent with patient: 45 minutes  Subjective:   Jonathan Knittle. is a 56 y.o. male patient admitted with patient not currently able to give history.  HPI: Patient seen and chart reviewed.  I have also had multiple communications with the staff in [place] about this patient.  56 year old man with a long history of chronic psychotic disorder who was admitted to the hospital at [facility] around 15 August after being found with dramatically altered mental status at home.  Patient was initially unresponsive very much and not at his mental baseline.  Had a complete workup and evaluation by medicine and neurology and psychiatry and has been in the hospital there for a couple of weeks.  After full evaluation it seems very likely that his continued altered mental state is related to catatonia.  We had discussed the possibility of moving him to [facility] for ECT.  It had recently been my understanding that that was being tabled for the moment but it appears that we have followed through with the transfer.  I saw the patient when he was in the course of being taken care of and set up by nursing on the unit.  Patient is holding himself in a specific posture somewhat atypical for most people with simple encephalopathy or coma.  Hands clasped in front of him.  He did respond to his first name being stated  and opened his eyes.  Nodded his head yes to a couple of questions.  He was able to tell me his name but was not able to answer any further questions.  Patient is currently being treated with antipsychotic and benzodiazepine  Past Psychiatric History: History of chronic mental health disorder probably schizoaffective.  Apparently had been living independently recently although he has a daily aide for 3 hours.  Old chart lists diagnoses of bipolar and "early onset dementia".  Nevertheless the current mental state seems to be very different than usual.  No diagnosis or history of previous catatonia listed.  Does not appear to have any active substance use problems.  Multiple medical problems including diabetes.  Risk to Self:   Risk to Others:   Prior Inpatient Therapy:   Prior Outpatient Therapy:    Past Medical History:  Past Medical History:  Diagnosis Date   ADD (attention deficit disorder)    Anxiety    Arthritis    right hip   Bipolar 1 disorder ([place])    Blood in urine    CKD (chronic kidney disease), stage III (HCC)    Creatinine elevation    Dementia ([place])    "early onset" (08/04/2017)   Depression    bipolar [facility]   Diabetes mellitus without complication ([place])    Family history of anesthesia complication    pt is unsure , but pt father may have been difficult to arouse    HCAP (healthcare-associated pneumonia) 10/31/2012   History of kidney stones    Hypertension  Hypogonadism male    Liver fatty degeneration    Microscopic hematuria    hereditary s/p Urology eval   Neuromuscular disorder ([place])    feet neuropathy    Osteoarthritis of right hip 11/28/2011   2012 2015 s/p THR Severe  Dr Novella Olive     Pleural effusion 11/02/2012   Pneumonia 10-2012   Pneumonia, organism unspecified([place]) 11/02/2012   Polysubstance dependence, non-opioid, in remission ([place])    remote   Primary osteoarthritis of left hip 05/22/2015   PTSD (post-traumatic stress disorder)    SOCIAL  ANXIETY DISORDER    Schizoaffective disorder ([place])    Substance abuse ([place])    Suicide attempt by multiple drug overdose ([place]) Jan 24, 2016   Grieving his cat's death 2015/06/27    Past Surgical History:  Procedure Laterality Date   BACK SURGERY     CLOSED REDUCTION METACARPAL WITH PERCUTANEOUS PINNING Right    LUMBAR [place] HIP ARTHROPLASTY Right 08/16/2013   Procedure: TOTAL HIP ARTHROPLASTY ANTERIOR APPROACH;  Surgeon: Hessie Dibble, MD;  Location: [facility];  Service: Orthopedics;  Laterality: Right;   TOTAL HIP ARTHROPLASTY Left 05/22/2015   Procedure: TOTAL HIP ARTHROPLASTY ANTERIOR APPROACH;  Surgeon: Melrose Nakayama, MD;  Location: [facility];  Service: Orthopedics;  Laterality: Left;   Family History:  Family History  Problem Relation Age of Onset   Diabetes Father    Cancer Mother        died of melanoma with mets   Cervical cancer Sister    Diabetes Sister    Other Neg Hx        hypogonadism   Colon cancer Neg Hx    Colon polyps Neg Hx    Esophageal cancer Neg Hx    Rectal cancer Neg Hx    Stomach cancer Neg Hx    Family Psychiatric  History: See previous Social History:  Social History   Substance and Sexual Activity  Alcohol Use Not Currently     Social History   Substance and Sexual Activity  Drug Use No   Comment: hx of marijuana/cocaine/crack use but sober since 06-26-22    Social History   Socioeconomic History   Marital status: Single    Spouse name: Not on file   Number of children: 0   Years of education: 14   Highest education level: Some college, no degree  Occupational History   Occupation: disability  Tobacco Use   Smoking status: Former    Years: 0.25    Types: Cigarettes    Quit date: 03/22/2019    Years since quitting: 2.7   Smokeless tobacco: Never  Vaping Use   Vaping Use: Never used  Substance and Sexual Activity   Alcohol use: Not Currently   Drug use: No    Comment: hx of marijuana/cocaine/crack use but  sober since June 26, 2022   Sexual activity: Not Currently  Other Topics Concern   Not on file  Social History Narrative   05/08/2021 lives alone, sister [name] helps with meds, he has some in home care, lived with sister until Nov 2020   Caffeine- sodas, amount  varies   regular exercise-no   Social Determinants of Health   Financial Resource Strain: Not on file  Food Insecurity: Not on file  Transportation Needs: Not on file  Physical Activity: Not on file  Stress: Not on file  Social Connections: Not on file   Additional Social History:    Allergies:  Allergies  Allergen Reactions   Vicodin [Hydrocodone-Acetaminophen] Itching    Labs:  Results for orders placed or performed during the hospital encounter of 12/20/21 (from the past 48 hour(s))  CBC     Status: Abnormal   Collection Time: 12/20/21  1:00 PM  Result Value Ref Range   WBC 5.7 4.0 - 10.5 K/uL   RBC 4.09 (L) 4.22 - 5.81 MIL/uL   Hemoglobin 11.4 (L) 13.0 - 17.0 g/dL   HCT 33.8 (L) 39.0 - 52.0 %   MCV 82.6 80.0 - 100.0 fL   MCH 27.9 26.0 - 34.0 pg   MCHC 33.7 30.0 - 36.0 g/dL   RDW 13.6 11.5 - 15.5 %   Platelets 284 150 - 400 K/uL   nRBC 0.0 0.0 - 0.2 %    Comment: Performed at [facility], [address] [place], [place] [address]  Creatinine, serum     Status: Abnormal   Collection Time: 12/20/21  1:00 PM  Result Value Ref Range   Creatinine, Ser 1.27 (H) 0.61 - 1.24 mg/dL   GFR, Estimated >60 >60 mL/min    Comment: (NOTE) Calculated using the CKD-EPI Creatinine Equation (2021) Performed at [facility], [address] [place], [place] [address]   Ammonia     Status: None   Collection Time: 12/20/21  1:00 PM  Result Value Ref Range   Ammonia 21 9 - 35 umol/L    Comment: Performed at [facility], [address], [place], [place] [address]    Current Facility-Administered Medications  Medication Dose Route Frequency Provider Last Rate Last Admin   acetaminophen (TYLENOL)  tablet 650 mg  650 mg Oral Q6H PRN Amin, Ankit Chirag, MD       ARIPiprazole (ABILIFY) tablet 5 mg  5 mg Oral Daily Amin, Ankit Chirag, MD   5 mg at 12/20/21 1506   atorvastatin (LIPITOR) tablet 10 mg  10 mg Oral QHS Amin, Ankit Chirag, MD       dextromethorphan-guaiFENesin ([place] DM) 30-600 MG per 12 hr tablet 1 tablet  1 tablet Oral BID PRN Amin, Ankit Chirag, MD       enoxaparin (LOVENOX) injection 40 mg  40 mg Subcutaneous Q24H Amin, Ankit Chirag, MD       fenofibrate tablet 160 mg  160 mg Oral Daily Amin, Ankit Chirag, MD   160 mg at 12/20/21 1506   hydrALAZINE (APRESOLINE) injection 10 mg  10 mg Intravenous Q4H PRN Amin, Ankit Chirag, MD       insulin aspart (novoLOG) injection 0-15 Units  0-15 Units Subcutaneous TID WC Amin, Ankit Chirag, MD       insulin glargine-yfgn (SEMGLEE) injection 35 Units  35 Units Subcutaneous Daily Amin, Ankit Chirag, MD   35 Units at 12/20/21 1648   ipratropium-albuterol (DUONEB) 0.5-2.5 (3) MG/3ML nebulizer solution 3 mL  3 mL Nebulization Q4H PRN Amin, Ankit Chirag, MD       LORazepam (ATIVAN) injection 2 mg  2 mg Intravenous TID Amin, Ankit Chirag, MD   2 mg at 12/20/21 1504   metoprolol tartrate (LOPRESSOR) injection 5 mg  5 mg Intravenous Q4H PRN Amin, Ankit Chirag, MD       nicotine (NICODERM CQ - dosed in mg/24 hours) patch 21 mg  21 mg Transdermal Daily Amin, Ankit Chirag, MD   21 mg at 12/20/21 1332   piperacillin-tazobactam (ZOSYN) IVPB 3.375 g  3.375 g Intravenous Q8H Amin, Ankit Chirag, MD 12.5 mL/hr at 12/20/21 1331 3.375 g at  12/20/21 1331   senna-docusate (Senokot-S) tablet 1 tablet  1 tablet Oral QHS PRN Amin, Ankit Chirag, MD       thiamine (VITAMIN B1) 500 mg in normal saline (50 mL) IVPB  500 mg Intravenous Q24H Amin, Ankit Chirag, MD       traZODone (DESYREL) tablet 50 mg  50 mg Oral QHS PRN Amin, Jeanella Flattery, MD        Musculoskeletal: Strength & Muscle Tone: decreased Gait & Station: unable to stand Patient leans:  N/A            Psychiatric Specialty Exam:  Presentation  General Appearance: Disheveled  Eye Contact:Poor  Speech:Slow; Blocked; Slurred; Garbled  Speech Volume:Decreased  Handedness:Right   Mood and Affect  Mood:-- ("good")  Affect:Appropriate; Congruent; Constricted   Thought Process  Thought Processes:Disorganized (thought blocking)  Descriptions of Associations:Loose  Orientation:None  Thought Content:Illogical; Scattered  History of Schizophrenia/Schizoaffective disorder:No data recorded Duration of Psychotic Symptoms:No data recorded Hallucinations:Hallucinations: None  Ideas of Reference:None  Suicidal Thoughts:Suicidal Thoughts: No  Homicidal Thoughts:Homicidal Thoughts: No   Sensorium  Memory:Immediate Fair  Judgment:Impaired  Insight:Shallow   Executive Functions  Concentration:Poor  Attention Span:Poor  Recall:Poor  Fund of Knowledge:Poor  Language:Poor   Psychomotor Activity  Psychomotor Activity:Psychomotor Activity: Other (comment)   Assets  Assets:Desire for Improvement   Sleep  Sleep:Sleep: Poor   Physical Exam: Physical Exam Vitals and nursing note reviewed.  Constitutional:      Appearance: Normal appearance. He is ill-appearing.  HENT:     Head: Normocephalic and atraumatic.     Mouth/Throat:     Pharynx: Oropharynx is clear.  Eyes:     Pupils: Pupils are equal, round, and reactive to light.  Cardiovascular:     Rate and Rhythm: Normal rate and regular rhythm.  Pulmonary:     Effort: Pulmonary effort is normal.     Breath sounds: Normal breath sounds.  Abdominal:     General: Abdomen is flat.     Palpations: Abdomen is soft.  Musculoskeletal:        General: Normal range of motion.  Skin:    General: Skin is warm and dry.  Neurological:     General: No focal deficit present.     Mental Status: Mental status is at baseline.  Psychiatric:        Attention and Perception: He is inattentive.         Mood and Affect: Mood normal. Affect is blunt.        Speech: Speech is delayed.        Behavior: Behavior is withdrawn.    Review of Systems  Unable to perform ROS: Mental status change   There were no vitals taken for this visit. There is no height or weight on file to calculate BMI.  Treatment Plan Summary: Medication management and Plan 56 year old man with chronic mental health problems.  Having reviewed the history on the chart and met the patient briefly I would agree with the assessment of Dr. Lovette Cliche and the psychiatric team in [place] that this very likely is a catatonic presentation.  He appears to have responded partially to benzodiazepines so far but nowhere near enough to consider discharge.  He actually probably would be a good candidate for ECT.  Unfortunately from a practical standpoint we do not have ECT treatment scheduled for Monday because of the Labor Day holiday.  We also have a extremely full schedule coming up this week and I am not sure what  the chances that we will be able to get him in for treatment Wednesday or Friday either but nevertheless the psychiatric team will continue to follow up.  I am not making any changes to medications.  I will put in a psychiatric consult order so that he is on our consult list over the weekend and recommend that physical and occupational therapy try to work with him if as possible as well.  Disposition:  See note above.  Will need some definite improvement in his condition before he is ready for discharge and at this point would be unmanageable on the psychiatric service.  Alethia Berthold, MD 12/20/2021 4:52 PM

## 2021-12-21 DIAGNOSIS — N179 Acute kidney failure, unspecified: Secondary | ICD-10-CM | POA: Diagnosis not present

## 2021-12-21 DIAGNOSIS — J690 Pneumonitis due to inhalation of food and vomit: Secondary | ICD-10-CM | POA: Diagnosis not present

## 2021-12-21 DIAGNOSIS — E785 Hyperlipidemia, unspecified: Secondary | ICD-10-CM

## 2021-12-21 DIAGNOSIS — A770 Spotted fever due to Rickettsia rickettsii: Secondary | ICD-10-CM

## 2021-12-21 DIAGNOSIS — F0390 Unspecified dementia without behavioral disturbance: Secondary | ICD-10-CM

## 2021-12-21 DIAGNOSIS — D638 Anemia in other chronic diseases classified elsewhere: Secondary | ICD-10-CM

## 2021-12-21 DIAGNOSIS — E1169 Type 2 diabetes mellitus with other specified complication: Secondary | ICD-10-CM

## 2021-12-21 DIAGNOSIS — G928 Other toxic encephalopathy: Secondary | ICD-10-CM | POA: Diagnosis not present

## 2021-12-21 DIAGNOSIS — N189 Chronic kidney disease, unspecified: Secondary | ICD-10-CM

## 2021-12-21 DIAGNOSIS — E041 Nontoxic single thyroid nodule: Secondary | ICD-10-CM

## 2021-12-21 LAB — CBC
HCT: 37.3 % — ABNORMAL LOW (ref 39.0–52.0)
Hemoglobin: 12.5 g/dL — ABNORMAL LOW (ref 13.0–17.0)
MCH: 28.2 pg (ref 26.0–34.0)
MCHC: 33.5 g/dL (ref 30.0–36.0)
MCV: 84 fL (ref 80.0–100.0)
Platelets: 281 10*3/uL (ref 150–400)
RBC: 4.44 MIL/uL (ref 4.22–5.81)
RDW: 13.8 % (ref 11.5–15.5)
WBC: 5.5 10*3/uL (ref 4.0–10.5)
nRBC: 0 % (ref 0.0–0.2)

## 2021-12-21 LAB — BASIC METABOLIC PANEL
Anion gap: 6 (ref 5–15)
BUN: 19 mg/dL (ref 6–20)
CO2: 26 mmol/L (ref 22–32)
Calcium: 9.5 mg/dL (ref 8.9–10.3)
Chloride: 107 mmol/L (ref 98–111)
Creatinine, Ser: 1.3 mg/dL — ABNORMAL HIGH (ref 0.61–1.24)
GFR, Estimated: >60 mL/min (ref >60)
Glucose, Bld: 118 mg/dL — ABNORMAL HIGH (ref 70–99)
Potassium: 3.7 mmol/L (ref 3.5–5.1)
Sodium: 139 mmol/L (ref 135–145)

## 2021-12-21 LAB — GLUCOSE, CAPILLARY
Glucose-Capillary: 118 mg/dL — ABNORMAL HIGH (ref 70–99)
Glucose-Capillary: 133 mg/dL — ABNORMAL HIGH (ref 70–99)
Glucose-Capillary: 162 mg/dL — ABNORMAL HIGH (ref 70–99)
Glucose-Capillary: 204 mg/dL — ABNORMAL HIGH (ref 70–99)
Glucose-Capillary: 184 mg/dL — ABNORMAL HIGH (ref 70–99)

## 2021-12-21 LAB — VITAMIN B12: Vitamin B-12: 847 pg/mL (ref 180–914)

## 2021-12-21 LAB — MAGNESIUM: Magnesium: 2.2 mg/dL (ref 1.7–2.4)

## 2021-12-21 LAB — FOLATE: Folate: 15.6 ng/mL (ref >5.9)

## 2021-12-21 NOTE — Assessment & Plan Note (Addendum)
Last hemoglobin A1c elevated at 8.0.  Patient on 30 units of Semglee insulin and sliding scale.  Holding statin with recent rhabdomyolysis.

## 2021-12-21 NOTE — Assessment & Plan Note (Signed)
Completed 5 days of doxycycline on 12/18/2021

## 2021-12-21 NOTE — Assessment & Plan Note (Addendum)
Hemoglobin stable.  Last hemoglobin 12.5.

## 2021-12-21 NOTE — Progress Notes (Signed)
  Progress Note   Patient: [name]. YNW:[id number] DOB: May 24, 1965 DOA: 12/20/2021     1 DOS: the patient was seen and examined on 12/21/2021     Assessment and Plan: * Toxic metabolic encephalopathy Seen by psychiatry and they will plan to do ECT at some point.  Patient on Abilify and Ativan.  Empirically on high-dose thiamine.  Following some simple commands and fed himself breakfast.  Aspiration pneumonia ([place]) IV Zosyn completed 7-day course today.  Diet changed to dysphagia 2 diet with nectar thick liquids.  Case discussed with speech therapy  [facility] spotted fever) Completed 5 days of doxycycline on 12/18/2021  Thyroid nodule Will need outpatient evaluation and ultrasound for this.  Type 2 diabetes mellitus with hyperlipidemia (HCC) Last hemoglobin A1c elevated at 8.0.  Patient on 35 units of Semglee insulin and sliding scale.  Holding statin with recent rhabdomyolysis.  Pressure injury of skin Present on admission.  Numerous areas of the body.  See full description below.  Acute kidney injury superimposed on CKD ([place]) Acute kidney injury has resolved.  Patient has underlying chronic kidney disease stage II with a creatinine of 1.3 today.  Dementia without behavioral disturbance ([place]) Listed in the problem list from back in 2019.  TSH normal range, B12 normal range and RPR nonreactive.  Anemia of chronic disease Hemoglobin stable.  Last hemoglobin 12.5.        Subjective: Patient answers questions and follows some simple commands.  Patient sent over here from [facility] for ECT treatments.  With the holiday weekend they will be now ECT on Monday.  Psychiatrist hoping to get him in on Wednesday or Friday.  Physical Exam: Vitals:   12/20/21 2338 12/21/21 0401 12/21/21 0446 12/21/21 0902  BP: 139/87 (!) 143/89  129/87  Pulse: 88 86  92  Resp: '18 20  16  '$ Temp: (!) 97.3 F (36.3 C) 97.8 F (36.6 C)  98 F (36.7 C)  TempSrc: Oral Oral   Oral  SpO2: 100% 99%  99%  Weight:   82 kg    Physical Exam HENT:     Head: Normocephalic.     Mouth/Throat:     Pharynx: No oropharyngeal exudate.  Eyes:     General: Lids are normal.     Conjunctiva/sclera: Conjunctivae normal.  Cardiovascular:     Rate and Rhythm: Normal rate and regular rhythm.     Heart sounds: Normal heart sounds, S1 normal and S2 normal.  Pulmonary:     Breath sounds: Normal breath sounds. No decreased breath sounds, wheezing, rhonchi or rales.  Abdominal:     Palpations: Abdomen is soft.     Tenderness: There is no abdominal tenderness.  Musculoskeletal:     Right lower leg: No swelling.     Left lower leg: No swelling.  Skin:    General: Skin is warm.     Findings: No rash.  Neurological:     Mental Status: He is alert.     Comments: Follows some simple commands, able to straight leg raise.     Data Reviewed: Creatinine 1.3 with a GFR greater than 60, hemoglobin 12.5  Family Communication: Updated patient's sister on the phone  Disposition: Status is: Inpatient Remains inpatient appropriate because: Will need ECT treatments  Planned Discharge Destination: Home    Time spent: 32 minutes  Author: Loletha Grayer, MD 12/21/2021 12:08 PM  For on call review [email or web address].

## 2021-12-21 NOTE — Assessment & Plan Note (Signed)
Will need outpatient evaluation and ultrasound for this.

## 2021-12-21 NOTE — Evaluation (Signed)
Occupational Therapy Evaluation Patient Details Name: [name]. MRN: [id number] DOB: Mar 20, 1966 Today's Date: 12/21/2021   History of Present Illness Pt is a 56 year old male who was admitted to [facility] on 12/01/2021 for altered mental status. Pt with long history of chronic psychotic disorder as well as dyslipidemia, hyperlipidemia, GERD, chronic kidney disease, BPH and type 2 diabetes mellitus with stage 4 chronic kidney disease, with long term current use of insulin. He was brought to [facility] where initially noted to be encephalopathic, DKA, AKI and rhabdomyolysis.  Slowly with fluid and insulin DKA, AKI and rhabdomyolysis resolved.  He was later also diagnosed with [facility] spotted fever for which she received 5 days of doxycycline and hospital course complicated by aspiration pneumonia requiring IV Zosyn.  His mentation was thought secondary to due to underlying psych issue as his MRI and EEG was negative.  Rest of the metabolic work-up was also negative. He transfered to [facility] on 12/20/2021 for ECT. He had been receiving PT/OT at [facility] progressed mobility to getting on B LE (crouched posture) x 5 with scoot pivot transfer to chair.   Clinical Impression   Patient seen for OT evaluation, sister and brother in law present in room. Patient presenting with impaired cognition, safety awareness, balance, and strength impacting safety and independence in ADLs. Prior to admission, patient lived alone with physical assistance from family and an aide 2 hours/day M-F. Patient currently functioning at Max A for most ADLs. Patient required multimodal cuing for all activities this date and repetition/increased time to follow one step commands. Patient endorsed fear of falling with OOB mobility. Unable to come to full standing position or take steps this date. Patient will benefit from acute OT to increase overall independence in the areas of ADLs and functional mobility. Upon  hospital discharge, recommend STR to maximize independence in ADLs and return to PLOF.   Recommendations for follow up therapy are one component of a multi-disciplinary discharge planning process, led by the attending physician.  Recommendations may be updated based on patient status, additional functional criteria and insurance authorization.   Follow Up Recommendations  Skilled nursing-short term rehab (<3 hours/day)    Assistance Recommended at Discharge Frequent or constant Supervision/Assistance  Patient can return home with the following Two people to help with walking and/or transfers;Two people to help with bathing/dressing/bathroom;Assistance with cooking/housework;Assistance with feeding;Direct supervision/assist for medications management;Direct supervision/assist for financial management;Assist for transportation;Help with stairs or ramp for entrance    Functional Status Assessment  Patient has had a recent decline in their functional status and/or demonstrates limited ability to make significant improvements in function in a reasonable and predictable amount of time  Equipment Recommendations  Other (comment) (defer to next venue of care)    Recommendations for Other Services       Precautions / Restrictions Precautions Precautions: Fall Restrictions Weight Bearing Restrictions: No      Mobility Bed Mobility Overal bed mobility: Needs Assistance Bed Mobility: Supine to Sit, Sit to Supine     Supine to sit: +2 for physical assistance, Mod assist, HOB elevated Sit to supine: Supervision   General bed mobility comments: Pt required physicial assist at trunk and BLEs for supine to sit, however, able to complete sit to supine with verbal cues only. Initiated rolling to L side INDly when asked to complete bridging bed mobility to scoot hips over.    Transfers Overall transfer level: Needs assistance Equipment used: Rolling walker (2 wheels) Transfers:  Sit to/from  Stand Sit to Stand: +2 physical assistance, Max assist, From elevated surface          Lateral/Scoot Transfers: Supervision, From elevated surface General transfer comment: Pt completed sit <> stand (4x total) from EOB with Max A x2 + RW. Pt unable to come to fully upright standing position, posterior bias noted. Pt extends knees and pushes feet forward, requiring Max A x2 to prevent falling backwards each trial. Pt endorsing significant fear of falling with OOB mobility. Pt unable to stand for >5 seconds. Pt benefited from elevated t/f surface on remainder of trials.      Balance Overall balance assessment: Needs assistance Sitting-balance support: Feet supported, Bilateral upper extremity supported Sitting balance-Leahy Scale: Good Sitting balance - Comments: steady sitting at edge of bed and able to shift forwards appropriately to scoot sideways   Standing balance support: Bilateral upper extremity supported Standing balance-Leahy Scale: Zero Standing balance comment: unable to stand fully upright                           ADL either performed or assessed with clinical judgement   ADL Overall ADL's : Needs assistance/impaired Eating/Feeding: Minimal assistance;Bed level Eating/Feeding Details (indicate cue type and reason): On nectar thick diet, Sister providing assistance in room for feeding   Grooming Details (indicate cue type and reason): Anticipate Min A at bed level           Upper Body Dressing Details (indicate cue type and reason): Anticipate Max A   Lower Body Dressing Details (indicate cue type and reason): Anticipate Max A Toilet Transfer: Moderate assistance;Maximal assistance;+2 for physical assistance;Cueing for safety;Cueing for sequencing;Rolling walker (2 wheels) Toilet Transfer Details (indicate cue type and reason): Mod-Max A x2 for STS from [place] Details (indicate cue type and reason): Anticipate Max A for  posterior hygiene and clothing management       General ADL Comments: Pt limited by level of arousal, ability to follow commands, and activity tolerance.     Vision Baseline Vision/History: 1 Wears glasses Patient Visual Report:  (unsure) Vision Assessment?: Vision impaired- to be further tested in functional context Additional Comments: Pt wears glasses at baseline, reports visual deficits when asked but unable to describe     Perception     Praxis      Pertinent Vitals/Pain Pain Assessment Pain Assessment: No/denies pain     Hand Dominance Right   Extremity/Trunk Assessment Upper Extremity Assessment Upper Extremity Assessment: Generalized weakness RUE Deficits / Details: Unable to complete shoulder flexion AROM despite verbal/visual cues (able to obtain full range with PROM), elbow flexion WFL, poor grip strength LUE Deficits / Details: Unable to complete shoulder flexion AROM despite verbal/visual cues (able to obtain full range with PROM), elbow flexion WFL, fair grip strength   Lower Extremity Assessment Lower Extremity Assessment: Generalized weakness RLE Deficits / Details: able to lift entire LE to bed, able to move into ankle dorsiflexion and hold it and great toe extension against break test, MMT at  least 3/5. Unclear what is weakness vs difficulty following/maintaining command. ROM sufficient for basic mobility. LLE Deficits / Details: able to lift entire LE to bed, able to move into ankle dorsiflexion and hold it and great toe extension against break test, MMT at least 3+/5. Unclear what is weakness vs difficulty following/maintaining command. Knee ROM sufficient for basic mobility.   Cervical / Trunk Assessment Cervical / Trunk  Assessment: Normal Cervical / Trunk Exceptions: Favors L side   Communication Communication Communication: No difficulties   Cognition Arousal/Alertness: Awake/alert Behavior During Therapy: Flat affect Overall Cognitive Status:  Impaired/Different from baseline Area of Impairment: Orientation, Attention, Following commands, Safety/judgement, Awareness, Problem solving, Memory                 Orientation Level: Disoriented to, Time, Situation (able to state year but not month or day) Current Attention Level: Focused Memory: Decreased short-term memory Following Commands: Follows one step commands inconsistently, Follows one step commands with increased time Safety/Judgement: Decreased awareness of deficits Awareness: Intellectual Problem Solving: Slow processing, Requires verbal cues General Comments: Required repetition/increased time for one step commands, avoiding eye contact throughout eval. Significant fear of falling with OOB mobility stating "I have trust issues."     General Comments       Exercises     Shoulder Instructions      Home Living Family/patient expects to be discharged to:: Private residence Living Arrangements: Alone Available Help at Discharge: Family;Other (Comment) (Personal care aid 2 hr per day M-F) Type of Home: Apartment Home Access: Level entry     Home Layout: One level     Bathroom Shower/Tub: Tub/shower unit (Sister reports tub shower, pt reports handicap accessible, but unsure if poor historian)   Biochemist, clinical: Standard     Home Equipment: Conservation officer, nature (2 wheels);Shower seat;Wheelchair - manual   Additional Comments: information from chart review previously obtained at [facility]      Prior Functioning/Environment Prior Level of Function : Needs assist             Mobility Comments: Ambulated with RW ADLs Comments: sister sets up medication, home health aide assists with dressing and bathing. Comes for 2 hours daily M-F        OT Problem List: Decreased strength;Decreased range of motion;Decreased activity tolerance;Impaired balance (sitting and/or standing);Impaired vision/perception;Decreased coordination;Decreased cognition;Decreased safety  awareness;Decreased knowledge of use of DME or AE      OT Treatment/Interventions: Self-care/ADL training;Therapeutic exercise;DME and/or AE instruction;Therapeutic activities;Cognitive remediation/compensation;Visual/perceptual remediation/compensation;Patient/family education;Balance training    OT Goals(Current goals can be found in the care plan section) Acute Rehab OT Goals Patient Stated Goal: none stated OT Goal Formulation: With patient/family Time For Goal Achievement: 01/04/22 Potential to Achieve Goals: Fair ADL Goals Pt Will Perform Grooming: with min assist;sitting Pt Will Perform Upper Body Dressing: with min assist;sitting Pt Will Perform Lower Body Dressing: with min assist;sitting/lateral leans Pt Will Transfer to Toilet: with min assist;stand pivot transfer;bedside commode Pt Will Perform Toileting - Clothing Manipulation and hygiene: with min assist;sit to/from stand  OT Frequency: Min 2X/week    Co-evaluation PT/OT/SLP Co-Evaluation/Treatment: Yes Reason for Co-Treatment: Complexity of the patient's impairments (multi-system involvement);Necessary to address cognition/behavior during functional activity;For patient/therapist safety;To address functional/ADL transfers PT goals addressed during session: Mobility/safety with mobility;Balance;Proper use of DME;Strengthening/ROM OT goals addressed during session: ADL's and self-care;Strengthening/ROM;Proper use of Adaptive equipment and DME      AM-PAC OT "6 Clicks" Daily Activity     Outcome Measure Help from another person eating meals?: A Lot Help from another person taking care of personal grooming?: A Lot Help from another person toileting, which includes using toliet, bedpan, or urinal?: A Lot Help from another person bathing (including washing, rinsing, drying)?: A Lot Help from another person to put on and taking off regular upper body clothing?: A Lot Help from another person to put on and taking off regular  lower body  clothing?: A Lot 6 Click Score: 12   End of Session Equipment Utilized During Treatment: Gait belt;Rolling walker (2 wheels) Nurse Communication: Mobility status  Activity Tolerance: Patient limited by fatigue Patient left: in bed;with call bell/phone within reach;with bed alarm set;with family/visitor present  OT Visit Diagnosis: Unsteadiness on feet (R26.81);Other abnormalities of gait and mobility (R26.89);Muscle weakness (generalized) (M62.81);Low vision, both eyes (H54.2);Other symptoms and signs involving cognitive function                Time: 1315-1345 OT Time Calculation (min): 30 min Charges:  OT General Charges $OT Visit: 1 Visit OT Evaluation $OT Eval Low Complexity: 1 Low  [facility] MS, OTR/L ascom [phone]  12/21/21, 5:46 PM

## 2021-12-21 NOTE — Assessment & Plan Note (Addendum)
Acute kidney injury has resolved.  Patient has underlying chronic kidney disease stage II with a creatinine of 1.16.  (Creatinine was as high as 3.24 at [facility])

## 2021-12-21 NOTE — Assessment & Plan Note (Addendum)
IV Zosyn completed 7-day course on 12/21/2021.  Speech therapy did another modified swallow evaluation on 01/01/2022 and advance his diet to thin liquids

## 2021-12-21 NOTE — Assessment & Plan Note (Addendum)
Patient completed a course of ECT treatments.  Mental status improved from admission.

## 2021-12-21 NOTE — Evaluation (Signed)
Clinical/Bedside Swallow Evaluation Patient Details  Name: [name]. MRN: [id number] Date of Birth: Sep 26, 1965  Today's Date: 12/21/2021 Time: SLP Start Time (ACUTE ONLY): 1000 SLP Stop Time (ACUTE ONLY): 1032 SLP Time Calculation (min) (ACUTE ONLY): 32 min  Past Medical History:  Past Medical History:  Diagnosis Date   ADD (attention deficit disorder)    Anxiety    Arthritis    right hip   Bipolar 1 disorder (HCC)    Blood in urine    CKD (chronic kidney disease), stage III (HCC)    Creatinine elevation    Dementia ([place])    "early onset" (08/04/2017)   Depression    bipolar [facility]   Diabetes mellitus without complication ([place])    Family history of anesthesia complication    pt is unsure , but pt father may have been difficult to arouse    HCAP (healthcare-associated pneumonia) 10/31/2012   History of kidney stones    Hypertension    Hypogonadism male    Liver fatty degeneration    Microscopic hematuria    hereditary s/p Urology eval   Neuromuscular disorder ([place])    feet neuropathy    Osteoarthritis of right hip 11/28/2011   2012 2015 s/p THR Severe  Dr Novella Olive     Pleural effusion 11/02/2012   Pneumonia 10-2012   Pneumonia, organism unspecified([place]) 11/02/2012   Polysubstance dependence, non-opioid, in remission ([place])    remote   Primary osteoarthritis of left hip 05/22/2015   PTSD (post-traumatic stress disorder)    SOCIAL ANXIETY DISORDER    Schizoaffective disorder ([place])    Substance abuse ([place])    Suicide attempt by multiple drug overdose ([place]) 2016-01-20   Grieving his cat's death 07/28/2015   Past Surgical History:  Past Surgical History:  Procedure Laterality Date   BACK SURGERY     CLOSED REDUCTION METACARPAL WITH PERCUTANEOUS PINNING Right    LUMBAR [place]     TOTAL HIP ARTHROPLASTY Right 08/16/2013   Procedure: TOTAL HIP ARTHROPLASTY ANTERIOR APPROACH;  Surgeon: Hessie Dibble, MD;  Location: [place];  Service:  Orthopedics;  Laterality: Right;   TOTAL HIP ARTHROPLASTY Left 05/22/2015   Procedure: TOTAL HIP ARTHROPLASTY ANTERIOR APPROACH;  Surgeon: Melrose Nakayama, MD;  Location: [place];  Service: Orthopedics;  Laterality: Left;   HPI:  Pt is a 57 year old male who was admitted to [facility] on 12/01/2021 for altered mental status. Pt with long history of chronic psychotic disorder as well as dyslipidemia, hyperlipidemia, GERD, chronic kidney disease, BPH and type 2 diabetes mellitus with stage 4 chronic kidney disease, with long term current use of insulin. Pt received ST services for dysphagia while at [facility] including a Modified Barium Swallow Study on  12/18/2021 revealing silent aspiration of thin liquids. Diet recommendation was dysphagia 2 with nectar thick liquids and medicine crushed in puree. Pt transferred to [facility] on 12/20/2021 for possible ECT treatments.    Assessment / Plan / Recommendation  Clinical Impression  At bedside pt consumed thin liquids via cup and nectar thick liquids via cup without any overt s/s of aspiration. At this time, given pt's silent aspiration of thin liquids, would like to see emerging s/s of aspiration at bedside when consuming thin liquids. Recommend pt consume previously recommended diet of dysphagia 2 with nectar thick liquids via cup and medicine crushed in puree. ST to follow for potential upgrade. SLP Visit Diagnosis: Dysphagia, oropharyngeal phase (R13.12)    Aspiration  Risk  Mild aspiration risk;Moderate aspiration risk    Diet Recommendation Dysphagia 2 (Fine chop);Nectar-thick liquid   Liquid Administration via: Cup Medication Administration: Crushed with puree Supervision: Staff to assist with self feeding Compensations: Small sips/bites;Slow rate;Clear throat intermittently Postural Changes: Seated upright at 90 degrees;Remain upright for at least 30 minutes after po intake    Other  Recommendations Oral Care Recommendations: Oral care BID Other  Recommendations: Order thickener from pharmacy;Prohibited food (jello, ice cream, thin soups);Remove water pitcher    Recommendations for follow up therapy are one component of a multi-disciplinary discharge planning process, led by the attending physician.  Recommendations may be updated based on patient status, additional functional criteria and insurance authorization.  Follow up Recommendations Skilled nursing-short term rehab (<3 hours/day)      Assistance Recommended at Discharge Frequent or constant Supervision/Assistance  Functional Status Assessment Patient has had a recent decline in their functional status and demonstrates the ability to make significant improvements in function in a reasonable and predictable amount of time.  Frequency and Duration min 2x/week  2 weeks       Prognosis Prognosis for Safe Diet Advancement: Good Barriers to Reach Goals: Cognitive deficits      Swallow Study   General Date of Onset: 12/02/21 HPI: Pt is a 56 year old male who was admitted to [facility] on 12/01/2021 for altered mental status. Pt with long history of chronic psychotic disorder as well as dyslipidemia, hyperlipidemia, GERD, chronic kidney disease, BPH and type 2 diabetes mellitus with stage 4 chronic kidney disease, with long term current use of insulin. Pt received ST services for dysphagia while at [facility] including a Modified Barium Swallow Study on  12/18/2021 revealing silent aspiration of thin liquids. Diet recommendation was dysphagia 2 with nectar thick liquids and medicine crushed in puree. Pt transferred to [facility] on 12/20/2021 for possible ECT treatments. Type of Study: Bedside Swallow Evaluation Previous Swallow Assessment: MBSS 12/18/2021 Diet Prior to this Study: Regular;Thin liquids Temperature Spikes Noted: No Respiratory Status: Room air History of Recent Intubation: No Behavior/Cognition: Alert;Cooperative;Requires cueing Oral Cavity Assessment: Within Functional  Limits Oral Care Completed by SLP: No Oral Cavity - Dentition: Adequate natural dentition Vision: Functional for self-feeding Self-Feeding Abilities: Needs set up Patient Positioning: Upright in bed Baseline Vocal Quality: Normal Volitional Cough: Weak Volitional Swallow: Able to elicit    Oral/Motor/Sensory Function Overall Oral Motor/Sensory Function: Generalized oral weakness   Ice Chips Ice chips: Not tested   Thin Liquid Thin Liquid: Impaired Presentation: Cup;Self Fed Other Comments: confirmed silent aspiration on recent MBSS    Nectar Thick Nectar Thick Liquid: Within functional limits Presentation: Cup   Honey Thick Honey Thick Liquid: Not tested   Puree Puree: Within functional limits Presentation: Self Fed;Spoon   Solid     Solid: Within functional limits Presentation: Self Fed Other Comments: dysphagia 2 textures     Wagner Tanzi B. Rutherford Nail, M.S., CCC-SLP, Mining engineer Certified Brain Injury [place]  [place] Office [phone] Ascom [phone] Fax [phone]

## 2021-12-21 NOTE — Assessment & Plan Note (Signed)
Listed in the problem list from back in 2019.  TSH normal range, B12 normal range and RPR nonreactive.

## 2021-12-21 NOTE — Progress Notes (Signed)
Patient arrived on floor in hospital bed.  Patient oriented to room, seated upright in bed, eating meal.  Patient oriented to room and unit.

## 2021-12-21 NOTE — Evaluation (Signed)
Physical Therapy Evaluation Patient Details Name: [name]. MRN: [id number] DOB: Jul 16, 1965 Today's Date: 12/21/2021  History of Present Illness  Pt is a 56 year old male who was admitted to [facility] on 12/01/2021 for altered mental status. Pt with long history of chronic psychotic disorder as well as dyslipidemia, hyperlipidemia, GERD, chronic kidney disease, BPH and type 2 diabetes mellitus with stage 4 chronic kidney disease, with long term current use of insulin. He was brought to [facility] where initially noted to be encephalopathic, DKA, AKI and rhabdomyolysis.  Slowly with fluid and insulin DKA, AKI and rhabdomyolysis resolved.  He was later also diagnosed with [facility] spotted fever for which she received 5 days of doxycycline and hospital course complicated by aspiration pneumonia requiring IV Zosyn.  His mentation was thought secondary to due to underlying psych issue as his MRI and EEG was negative.  Rest of the metabolic work-up was also negative. He transfered to [facility] on 12/20/2021 for ECT. He had been receiving PT/OT at [facility] progressed mobility to getting on B LE (crouched posture) x 5 with scoot pivot transfer to chair.   Clinical Impression  Patient received reclining in bed receiving food from sister upon arrival. He is agreeable to PT evaluation and his family reports he is more alert than they have seen since hospitalization. Prior to hospitalization he lived alone with intermittent assistance from an aide 2hs/day M-F and his family, and he ambulated with a RW. Upon PT eval, patient required mod A +2 for supine to sit, max A+2 for sit <> stand at edge of bed, and supervision for right lateral scooting at edge of elevated bed and supine to sit bed mobility. Patient was awake and alert with flat affect and spoke in short phrases when spoken to. He followed single commands with increased time and repetition. Patient was fearful of falling with sit <> stand  attempts to RW and was unable to fully extend knees and hips while demonstrating strong posterior bias. He reported he felt like he was falling forwards. Patient continues to show improvements in mobility since initial hospitalization at [facility] and discharge to skilled nursing facility for short term rehab remains appropriate at discharge. Patient would benefit from skilled physical therapy to address impairments and functional limitations (see PT Problem List below) to work towards stated goals and return to PLOF or maximal functional independence.     Recommendations for follow up therapy are one component of a multi-disciplinary discharge planning process, led by the attending physician.  Recommendations may be updated based on patient status, additional functional criteria and insurance authorization.  Follow Up Recommendations Skilled nursing-short term rehab (<3 hours/day) Can patient physically be transported by private vehicle: No    Assistance Recommended at Discharge Frequent or constant Supervision/Assistance  Patient can return home with the following  Two people to help with walking and/or transfers;Two people to help with bathing/dressing/bathroom;Direct supervision/assist for medications management;Direct supervision/assist for financial management;Assist for transportation;Help with stairs or ramp for entrance;Assistance with feeding;Assistance with cooking/housework    Equipment Recommendations Other (comment) (to be reccomended at next venue of care)  Recommendations for Other Services       Functional Status Assessment Patient has had a recent decline in their functional status and demonstrates the ability to make significant improvements in function in a reasonable and predictable amount of time.     Precautions / Restrictions Precautions Precautions: Fall Restrictions Weight Bearing Restrictions: No  Mobility  Bed Mobility Overal bed mobility: Needs  Assistance Bed Mobility: Supine to Sit, Sit to Supine     Supine to sit: +2 for physical assistance, Mod assist Sit to supine: Supervision   General bed mobility comments: required physicial assist at trunk and B LE for supine to sit but able to move sit to supine with cuing only. Was unable to follow commands for sideways scoot due, seeming confused about the difference between scoot and roll.    Transfers Overall transfer level: Needs assistance Equipment used: Rolling walker (2 wheels) Transfers: Sit to/from Stand Sit to Stand: +2 physical assistance, Max assist, From elevated surface          Lateral/Scoot Transfers: Supervision, From elevated surface General transfer comment: Patient completed sit <> stand at edge of bed to RW from low bed x2 reps with max A +2 and from more elevated bed max A x2 (easier to come towards stand). Patient demo posterior bias in standing and feels he is falling forwards (per his report). Patient demo decreasing quality of stand attempt with repetition with shorter tolerance for standing (~ 5 seconds or less). Extends knees and pushes feet fowards, requiring max A from PT and OT positioned at each side to prevent fall backwards. Patient reports fear of falling and "trust issues" impairing his ability to shift weight forwards to the walker or stand up taller. Attempted sit <> stand max A +1 with PT blocking knees and pt arms supported at back but did not get leverage right on first attempt resulting in insufficient forward momentum to get up and patient too fearful to try again. Able to scoot right laterally with supervision and cuing to do so. Demonstrates appropriate forward shift with lateral scooting.    Ambulation/Gait               General Gait Details: unable to attempt  Stairs            Wheelchair Mobility    Modified Rankin (Stroke Patients Only)       Balance Overall balance assessment: Needs assistance Sitting-balance  support: Feet supported, Bilateral upper extremity supported Sitting balance-Leahy Scale: Good Sitting balance - Comments: steady sitting at edge of bed and able to shift forwards appropriately to scoot sideways, then go sit to supine.   Standing balance support: Bilateral upper extremity supported Standing balance-Leahy Scale: Zero Standing balance comment: flexed knees and hips in standing                             Pertinent Vitals/Pain Pain Assessment Pain Assessment: No/denies pain    Home Living Family/patient expects to be discharged to:: Private residence Living Arrangements: Alone Available Help at Discharge: Family;Other (Comment) (Personal care aid 2 hr per day M-F) Type of Home: Apartment Home Access: Level entry       Home Layout: One level Home Equipment: Conservation officer, nature (2 wheels);Shower seat;Wheelchair - manual Additional Comments: information from chart review previously obtained at [facility]    Prior Function Prior Level of Function : Needs assist             Mobility Comments: Ambulated with RW ADLs Comments: sister sets up medication, home health aid assists with dressing and bathing. Comes for 2 hours daily M-F     Hand Dominance   Dominant Hand: Right    Extremity/Trunk Assessment   Upper Extremity Assessment Upper Extremity Assessment: Defer to OT evaluation  Lower Extremity Assessment Lower Extremity Assessment: Generalized weakness RLE Deficits / Details: able to lift entire LE to bed, able to move into ankle dorsiflexion and hold it and great toe extension against break test, MMT at  least 3/5. Unclear what is weakness vs difficulty following/maintaining command. ROM sufficient for basic mobility. LLE Deficits / Details: able to lift entire LE to bed, able to move into ankle dorsiflexion and hold it and great toe extension against break test, MMT at least 3+/5. Unclear what is weakness vs difficulty following/maintaining  command. Knee ROM sufficient for basic mobility.    Cervical / Trunk Assessment Cervical / Trunk Assessment: Normal  Communication   Communication: No difficulties  Cognition Arousal/Alertness: Awake/alert Behavior During Therapy: Flat affect Overall Cognitive Status: Impaired/Different from baseline Area of Impairment: Orientation, Attention, Following commands, Safety/judgement, Awareness, Problem solving, Memory                 Orientation Level: Disoriented to, Time, Situation (able to state year but not month or day)   Memory: Decreased short-term memory Following Commands: Follows one step commands inconsistently, Follows one step commands with increased time Safety/Judgement: Decreased awareness of deficits   Problem Solving: Slow processing, Requires verbal cues          General Comments      Exercises Other Exercises Other Exercises: educated on role of PT at [facility] in acute care setting, purpose of eval.   Assessment/Plan    PT Assessment Patient needs continued PT services  PT Problem List Decreased strength;Decreased range of motion;Decreased activity tolerance;Decreased balance;Decreased coordination;Decreased mobility;Decreased cognition;Decreased knowledge of use of DME;Decreased safety awareness       PT Treatment Interventions DME instruction;Gait training;Functional mobility training;Therapeutic exercise;Therapeutic activities;Balance training;Neuromuscular re-education;Patient/family education;Cognitive remediation    PT Goals (Current goals can be found in the Care Plan section)  Acute Rehab PT Goals Patient Stated Goal: eat lunch PT Goal Formulation: With patient Time For Goal Achievement: 01/04/22 Potential to Achieve Goals: Good    Frequency Min 2X/week     Co-evaluation PT/OT/SLP Co-Evaluation/Treatment: Yes Reason for Co-Treatment: Complexity of the patient's impairments (multi-system involvement);Necessary to address  cognition/behavior during functional activity;For patient/therapist safety;To address functional/ADL transfers PT goals addressed during session: Mobility/safety with mobility;Balance;Proper use of DME;Strengthening/ROM         AM-PAC PT "6 Clicks" Mobility  Outcome Measure Help needed turning from your back to your side while in a flat bed without using bedrails?: A Lot Help needed moving from lying on your back to sitting on the side of a flat bed without using bedrails?: A Lot Help needed moving to and from a bed to a chair (including a wheelchair)?: Total Help needed standing up from a chair using your arms (e.g., wheelchair or bedside chair)?: Total Help needed to walk in hospital room?: Total Help needed climbing 3-5 steps with a railing? : Total 6 Click Score: 8    End of Session Equipment Utilized During Treatment: Gait belt Activity Tolerance: Patient limited by fatigue;Patient tolerated treatment well Patient left: with call bell/phone within reach;in bed;with family/visitor present;with bed alarm set Nurse Communication: Mobility status PT Visit Diagnosis: Unsteadiness on feet (R26.81);Muscle weakness (generalized) (M62.81);Difficulty in walking, not elsewhere classified (R26.2)    Time: 9622-2979 PT Time Calculation (min) (ACUTE ONLY): 30 min   Charges:   PT Evaluation $PT Eval Moderate Complexity: 1 Mod          Obi Scrima R. Graylon Good, PT, DPT 12/21/21, 2:21 PM  [facility] C[facility] [address] [place], [place] [address] P: [phone] I F: [phone]

## 2021-12-21 NOTE — Assessment & Plan Note (Signed)
Present on admission.  Numerous areas of the body.  See full description below.

## 2021-12-22 DIAGNOSIS — A770 Spotted fever due to Rickettsia rickettsii: Secondary | ICD-10-CM | POA: Diagnosis not present

## 2021-12-22 DIAGNOSIS — G928 Other toxic encephalopathy: Secondary | ICD-10-CM | POA: Diagnosis not present

## 2021-12-22 DIAGNOSIS — N179 Acute kidney failure, unspecified: Secondary | ICD-10-CM | POA: Diagnosis not present

## 2021-12-22 DIAGNOSIS — J690 Pneumonitis due to inhalation of food and vomit: Secondary | ICD-10-CM | POA: Diagnosis not present

## 2021-12-22 LAB — BASIC METABOLIC PANEL
Anion gap: 5 (ref 5–15)
BUN: 19 mg/dL (ref 6–20)
CO2: 25 mmol/L (ref 22–32)
Calcium: 10.2 mg/dL (ref 8.9–10.3)
Chloride: 110 mmol/L (ref 98–111)
Creatinine, Ser: 1.39 mg/dL — ABNORMAL HIGH (ref 0.61–1.24)
GFR, Estimated: >60 mL/min (ref >60)
Glucose, Bld: 159 mg/dL — ABNORMAL HIGH (ref 70–99)
Potassium: 5 mmol/L (ref 3.5–5.1)
Sodium: 140 mmol/L (ref 135–145)

## 2021-12-22 LAB — CBC
HCT: 38.4 % — ABNORMAL LOW (ref 39.0–52.0)
Hemoglobin: 13 g/dL (ref 13.0–17.0)
MCH: 28.4 pg (ref 26.0–34.0)
MCHC: 33.9 g/dL (ref 30.0–36.0)
MCV: 83.8 fL (ref 80.0–100.0)
Platelets: 259 10*3/uL (ref 150–400)
RBC: 4.58 MIL/uL (ref 4.22–5.81)
RDW: 14 % (ref 11.5–15.5)
WBC: 7 10*3/uL (ref 4.0–10.5)
nRBC: 0 % (ref 0.0–0.2)

## 2021-12-22 LAB — MAGNESIUM: Magnesium: 2.1 mg/dL (ref 1.7–2.4)

## 2021-12-22 LAB — GLUCOSE, CAPILLARY
Glucose-Capillary: 138 mg/dL — ABNORMAL HIGH (ref 70–99)
Glucose-Capillary: 111 mg/dL — ABNORMAL HIGH (ref 70–99)
Glucose-Capillary: 291 mg/dL — ABNORMAL HIGH (ref 70–99)
Glucose-Capillary: 124 mg/dL — ABNORMAL HIGH (ref 70–99)

## 2021-12-22 NOTE — Progress Notes (Signed)
  Progress Note   Patient: [name]. GDJ:[id number] DOB: 1965-05-13 DOA: 12/20/2021     2 DOS: the patient was seen and examined on 12/22/2021     Assessment and Plan: * Toxic metabolic encephalopathy Seen by psychiatry and they will plan to do ECT at some point.  Patient on Abilify and Ativan.  Empirically on high-dose thiamine.  Answers questions appropriately but simple with his answers  Aspiration pneumonia ([place]) IV Zosyn completed 7-day course on 12/21/2021.  Diet dysphagia 2 diet with nectar thick liquids.    RMSF [facility] spotted fever) Completed 5 days of doxycycline on 12/18/2021  Thyroid nodule Will need outpatient evaluation and ultrasound for this.  Type 2 diabetes mellitus with hyperlipidemia (HCC) Last hemoglobin A1c elevated at 8.0.  Patient on 35 units of Semglee insulin and sliding scale.  Holding statin with recent rhabdomyolysis.  Pressure injury of skin Present on admission.  Numerous areas of the body.  See full description below.  Acute kidney injury superimposed on CKD ([place]) Acute kidney injury has resolved.  Patient has underlying chronic kidney disease stage II with a creatinine of 1.39 today.  Dementia without behavioral disturbance ([place]) Listed in the problem list from back in 2019.  TSH normal range, B12 normal range and RPR nonreactive.  Anemia of chronic disease Hemoglobin stable.  Last hemoglobin 12.5.        Subjective: Patient answers some yes/no questions.  Did have a little coughing this morning.  Did not offer any complaints.  Sent over from [facility] for ECT treatments.  Physical Exam: Vitals:   12/21/21 1847 12/21/21 2126 12/22/21 0552 12/22/21 0808  BP: (!) 136/90 108/70 137/89 125/86  Pulse: 100 90 98 96  Resp: '18 18 18 20  '$ Temp: 98.2 F (36.8 C) 99 F (37.2 C) 97.7 F (36.5 C) 97.9 F (36.6 C)  TempSrc:      SpO2: 100% 100% 99% 100%  Weight:       Physical Exam HENT:     Head: Normocephalic.      Mouth/Throat:     Pharynx: No oropharyngeal exudate.  Eyes:     General: Lids are normal.     Conjunctiva/sclera: Conjunctivae normal.  Cardiovascular:     Rate and Rhythm: Normal rate and regular rhythm.     Heart sounds: Normal heart sounds, S1 normal and S2 normal.  Pulmonary:     Breath sounds: Normal breath sounds. No decreased breath sounds, wheezing, rhonchi or rales.  Abdominal:     Palpations: Abdomen is soft.     Tenderness: There is no abdominal tenderness.  Musculoskeletal:     Right lower leg: No swelling.     Left lower leg: No swelling.  Skin:    General: Skin is warm.     Findings: No rash.  Neurological:     Mental Status: He is alert.     Comments: Follows some simple commands, able to straight leg raise.     Data Reviewed: Creatinine 1.39 potassium 5.0, calcium 10.2, hemoglobin 13, white blood count 7.0, platelet count 259.  Family Communication: Left message for patient's sister  Disposition: Status is: Inpatient Remains inpatient appropriate because: Awaiting ECT treatments  Planned Discharge Destination: Skilled nursing facility    Time spent: 28 minutes  Author: Loletha Grayer, MD 12/22/2021 12:36 PM  For on call review [email or web address].

## 2021-12-23 DIAGNOSIS — E781 Pure hyperglyceridemia: Secondary | ICD-10-CM

## 2021-12-23 DIAGNOSIS — J690 Pneumonitis due to inhalation of food and vomit: Secondary | ICD-10-CM | POA: Diagnosis not present

## 2021-12-23 DIAGNOSIS — N179 Acute kidney failure, unspecified: Secondary | ICD-10-CM | POA: Diagnosis not present

## 2021-12-23 DIAGNOSIS — G928 Other toxic encephalopathy: Secondary | ICD-10-CM | POA: Diagnosis not present

## 2021-12-23 DIAGNOSIS — E871 Hypo-osmolality and hyponatremia: Secondary | ICD-10-CM

## 2021-12-23 DIAGNOSIS — A770 Spotted fever due to Rickettsia rickettsii: Secondary | ICD-10-CM | POA: Diagnosis not present

## 2021-12-23 LAB — CBC
HCT: 36.4 % — ABNORMAL LOW (ref 39.0–52.0)
Hemoglobin: 12.4 g/dL — ABNORMAL LOW (ref 13.0–17.0)
MCH: 28.5 pg (ref 26.0–34.0)
MCHC: 34.1 g/dL (ref 30.0–36.0)
MCV: 83.7 fL (ref 80.0–100.0)
Platelets: 244 10*3/uL (ref 150–400)
RBC: 4.35 MIL/uL (ref 4.22–5.81)
RDW: 14.4 % (ref 11.5–15.5)
WBC: 8.1 10*3/uL (ref 4.0–10.5)
nRBC: 0 % (ref 0.0–0.2)

## 2021-12-23 LAB — BASIC METABOLIC PANEL
Anion gap: 8 (ref 5–15)
BUN: 25 mg/dL — ABNORMAL HIGH (ref 6–20)
CO2: 21 mmol/L — ABNORMAL LOW (ref 22–32)
Calcium: 9.3 mg/dL (ref 8.9–10.3)
Chloride: 105 mmol/L (ref 98–111)
Creatinine, Ser: 1.3 mg/dL — ABNORMAL HIGH (ref 0.61–1.24)
GFR, Estimated: >60 mL/min (ref >60)
Glucose, Bld: 178 mg/dL — ABNORMAL HIGH (ref 70–99)
Potassium: 3.8 mmol/L (ref 3.5–5.1)
Sodium: 134 mmol/L — ABNORMAL LOW (ref 135–145)

## 2021-12-23 LAB — MAGNESIUM: Magnesium: 1.9 mg/dL (ref 1.7–2.4)

## 2021-12-23 LAB — GLUCOSE, CAPILLARY
Glucose-Capillary: 210 mg/dL — ABNORMAL HIGH (ref 70–99)
Glucose-Capillary: 115 mg/dL — ABNORMAL HIGH (ref 70–99)
Glucose-Capillary: 192 mg/dL — ABNORMAL HIGH (ref 70–99)
Glucose-Capillary: 220 mg/dL — ABNORMAL HIGH (ref 70–99)

## 2021-12-23 MED ORDER — POLYETHYLENE GLYCOL 3350 17 G PO PACK: 17.0000 g | PACK | Freq: Every day | ORAL | Status: DC

## 2021-12-23 MED ORDER — FLEET ENEMA 7-19 GM/118ML RE ENEM: 1.0000 | ENEMA | Freq: Every day | RECTAL | Status: DC | PRN

## 2021-12-23 MED ORDER — SENNOSIDES-DOCUSATE SODIUM 8.6-50 MG PO TABS
2.0000 | ORAL_TABLET | Freq: Every day | ORAL | Status: DC
  Administered 2021-12-23 – 2022-01-02 (×10): 2 via ORAL
  Filled 2021-12-23 (×10): qty 2

## 2021-12-23 MED ORDER — BISACODYL 5 MG PO TBEC
5.0000 mg | DELAYED_RELEASE_TABLET | Freq: Once | ORAL | Status: AC
Start: 2021-12-23 — End: 2021-12-23
  Administered 2021-12-23: 5 mg via ORAL
  Filled 2021-12-23: qty 1

## 2021-12-23 NOTE — Assessment & Plan Note (Addendum)
Last sodium normal range 

## 2021-12-23 NOTE — Hospital Course (Addendum)
56 year old man with ADD, bipolar disorder, dementia, type 2 diabetes mellitus, CKD stage II, hypertension, neuropathy, schizoaffective disorder.  Patient was transferred from [facility] over to [facility] to obtain ECT treatments.  Psychiatry will try to start ECT treatments on Wednesday or Friday.  Patient initially had [facility] for encephalopathy, DKA, acute kidney injury and rhabdomyolysis.  Patient was found to have [facility] spotted fever and aspiration pneumonia.  Patient completed doxycycline for [facility] spotted fever and IV Zosyn for aspiration pneumonia.  Antibiotics completed. ECT started on 12/25/2021, completed on 9/12.  Patient improved with the strengthening PT and OT now recommending home with home health.  The patient does live alone.  Patient's family unable to pick him up until 01/03/2022.

## 2021-12-23 NOTE — Progress Notes (Signed)
Progress Note   Patient: [name]. PPI:[id number] DOB: [birth year] DOA: 12/20/2021     3 DOS: the patient was seen and examined on 12/23/2021   Brief hospital course: 56 year old man with ADD, bipolar disorder, dementia, type 2 diabetes mellitus, CKD stage II, hypertension, neuropathy, schizoaffective disorder.  Patient was transferred from [facility] over to [facility] to obtain ECT treatments.  Patient initially had [facility] for encephalopathy, DKA, acute kidney injury and rhabdomyolysis.  Patient was found to have [facility] spotted fever and aspiration pneumonia.  Patient completed doxycycline for [facility] spotted fever and IV Zosyn for aspiration pneumonia.  Assessment and Plan: * Toxic metabolic encephalopathy Seen by psychiatry and they will plan to do ECT at some point.  Patient on Abilify and Ativan.  Empirically on high-dose thiamine.  Answers questions appropriately but simple with his answers  Aspiration pneumonia ([place]) IV Zosyn completed 7-day course on 12/21/2021.  Diet dysphagia 2 diet with nectar thick liquids.    RMSF [facility] spotted fever) Completed 5 days of doxycycline on 12/18/2021  Hyponatremia Sodium one-point less than the normal range.  Thyroid nodule Will need outpatient evaluation and ultrasound for this.  Type 2 diabetes mellitus with hyperlipidemia (HCC) Last hemoglobin A1c elevated at 8.0.  Patient on 35 units of Semglee insulin and sliding scale.  Holding statin with recent rhabdomyolysis.  Pressure injury of skin Present on admission.  Numerous areas of the body.  See full description below.  Acute kidney injury superimposed on CKD ([place]) Acute kidney injury has resolved.  Patient has underlying chronic kidney disease stage II with a creatinine of 1.30 today.  Dementia without behavioral disturbance ([place]) Listed in the problem list from back in 2019.  TSH normal range, B12 normal range and RPR  nonreactive.  Anemia of chronic disease Hemoglobin stable.  Last hemoglobin 12.4.        Subjective: Patient feels okay.  Offers no complaints.  Answers simple questions and able to follow commands.  Awaiting ECT treatments.  Physical Exam: Vitals:   12/22/21 1630 12/22/21 2039 12/23/21 0421 12/23/21 0746  BP: 131/78 131/88 (!) 129/91 123/80  Pulse: 95 99 (!) 106 93  Resp: '20 18 18 18  '$ Temp: 98.5 F (36.9 C) 98.1 F (36.7 C) 97.7 F (36.5 C) 98.6 F (37 C)  TempSrc:  Oral    SpO2: 100% 96% 97% 100%  Weight:       Physical Exam HENT:     Head: Normocephalic.     Mouth/Throat:     Pharynx: No oropharyngeal exudate.  Eyes:     General: Lids are normal.     Conjunctiva/sclera: Conjunctivae normal.  Cardiovascular:     Rate and Rhythm: Normal rate and regular rhythm.     Heart sounds: Normal heart sounds, S1 normal and S2 normal.  Pulmonary:     Breath sounds: Normal breath sounds. No decreased breath sounds, wheezing, rhonchi or rales.  Abdominal:     Palpations: Abdomen is soft.     Tenderness: There is no abdominal tenderness.  Musculoskeletal:     Right lower leg: No swelling.     Left lower leg: No swelling.  Skin:    General: Skin is warm.     Findings: No rash.  Neurological:     Mental Status: He is alert.     Comments: Follows some simple commands, able to straight leg raise.     Data Reviewed: Sodium 134, creatinine 1.3, hemoglobin 12.4  Family Communication: Spoke with family yesterday  Disposition: Status is: Inpatient Remains inpatient appropriate because: Awaiting ECT treatments  Planned Discharge Destination: Rehab    Time spent: 27 minutes  Author: Loletha Grayer, MD 12/23/2021 3:20 PM  For on call review [email or web address].

## 2021-12-24 ENCOUNTER — Other Ambulatory Visit: Payer: Self-pay | Admitting: Psychiatry

## 2021-12-24 ENCOUNTER — Other Ambulatory Visit: Payer: Self-pay | Admitting: Family Medicine

## 2021-12-24 DIAGNOSIS — N179 Acute kidney failure, unspecified: Secondary | ICD-10-CM | POA: Diagnosis not present

## 2021-12-24 DIAGNOSIS — A770 Spotted fever due to Rickettsia rickettsii: Secondary | ICD-10-CM | POA: Diagnosis not present

## 2021-12-24 DIAGNOSIS — G928 Other toxic encephalopathy: Secondary | ICD-10-CM | POA: Diagnosis not present

## 2021-12-24 DIAGNOSIS — J690 Pneumonitis due to inhalation of food and vomit: Secondary | ICD-10-CM | POA: Diagnosis not present

## 2021-12-24 DIAGNOSIS — E871 Hypo-osmolality and hyponatremia: Secondary | ICD-10-CM

## 2021-12-24 DIAGNOSIS — M5136 Other intervertebral disc degeneration, lumbar region: Secondary | ICD-10-CM

## 2021-12-24 DIAGNOSIS — R531 Weakness: Secondary | ICD-10-CM

## 2021-12-24 LAB — CBC
HCT: 38.3 % — ABNORMAL LOW (ref 39.0–52.0)
Hemoglobin: 12.7 g/dL — ABNORMAL LOW (ref 13.0–17.0)
MCH: 28.1 pg (ref 26.0–34.0)
MCHC: 33.2 g/dL (ref 30.0–36.0)
MCV: 84.7 fL (ref 80.0–100.0)
Platelets: 218 10*3/uL (ref 150–400)
RBC: 4.52 MIL/uL (ref 4.22–5.81)
RDW: 14.6 % (ref 11.5–15.5)
WBC: 4.6 10*3/uL (ref 4.0–10.5)
nRBC: 0 % (ref 0.0–0.2)

## 2021-12-24 LAB — BASIC METABOLIC PANEL
Anion gap: 8 (ref 5–15)
BUN: 22 mg/dL — ABNORMAL HIGH (ref 6–20)
CO2: 25 mmol/L (ref 22–32)
Calcium: 9.7 mg/dL (ref 8.9–10.3)
Chloride: 104 mmol/L (ref 98–111)
Creatinine, Ser: 1.27 mg/dL — ABNORMAL HIGH (ref 0.61–1.24)
GFR, Estimated: >60 mL/min (ref >60)
Glucose, Bld: 163 mg/dL — ABNORMAL HIGH (ref 70–99)
Potassium: 3.6 mmol/L (ref 3.5–5.1)
Sodium: 137 mmol/L (ref 135–145)

## 2021-12-24 LAB — MAGNESIUM: Magnesium: 1.8 mg/dL (ref 1.7–2.4)

## 2021-12-24 LAB — GLUCOSE, CAPILLARY
Glucose-Capillary: 169 mg/dL — ABNORMAL HIGH (ref 70–99)
Glucose-Capillary: 157 mg/dL — ABNORMAL HIGH (ref 70–99)
Glucose-Capillary: 181 mg/dL — ABNORMAL HIGH (ref 70–99)
Glucose-Capillary: 208 mg/dL — ABNORMAL HIGH (ref 70–99)

## 2021-12-24 MED ORDER — MAGNESIUM OXIDE -MG SUPPLEMENT 400 (240 MG) MG PO TABS
400.0000 mg | ORAL_TABLET | Freq: Every day | ORAL | Status: DC
  Administered 2021-12-24 – 2022-01-03 (×9): 400 mg via ORAL
  Filled 2021-12-24 (×9): qty 1

## 2021-12-24 MED ORDER — FLEET ENEMA 7-19 GM/118ML RE ENEM
1.0000 | ENEMA | Freq: Once | RECTAL | Status: AC
Start: 2021-12-24 — End: 2021-12-24
  Administered 2021-12-24: 1 via RECTAL

## 2021-12-24 NOTE — Assessment & Plan Note (Addendum)
Patient improving with physical therapy.  Physical therapy recommending home with home health

## 2021-12-24 NOTE — Progress Notes (Signed)
Physical Therapy Treatment Patient Details Name: [name]. MRN: [id number] DOB: [birth year] Today's Date: 12/24/2021   History of Present Illness Pt is a 56 year old male who was admitted to [facility] on 12/01/2021 for altered mental status. Pt with long history of chronic psychotic disorder as well as dyslipidemia, hyperlipidemia, GERD, chronic kidney disease, BPH and type 2 diabetes mellitus with stage 4 chronic kidney disease, with long term current use of insulin. He was brought to [facility] where initially noted to be encephalopathic, DKA, AKI and rhabdomyolysis.  Slowly with fluid and insulin DKA, AKI and rhabdomyolysis resolved.  He was later also diagnosed with [facility] spotted fever for which she received 5 days of doxycycline and hospital course complicated by aspiration pneumonia requiring IV Zosyn.  His mentation was thought secondary to due to underlying psych issue as his MRI and EEG was negative.  Rest of the metabolic work-up was also negative. He transfered to [facility] on 12/20/2021 for ECT. He had been receiving PT/OT at [facility] progressed mobility to getting on B LE (crouched posture) x 5 with scoot pivot transfer to chair.    PT Comments    Pt seen this am with OT due to extensive assist with mobility and concerns for safe transfers for pt and staff. Pt was agreeable to OOB to [facility]. Continues to require +2 assist consisting of Max of 1 and Min of 1 for safety during transfer transition. Pt was able to stand at [facility] several times with MaxA to maintain upright posture and encourage stance time. Increased difficulty with transfers due to pt's fear of falling and not trusting RW (pt uses rollator at home). Overall, pt very cooperative and willing to participate. Will plan to progress as tolerated per POC.    Recommendations for follow up therapy are one component of a multi-disciplinary discharge planning process, led by the attending physician.  Recommendations may  be updated based on patient status, additional functional criteria and insurance authorization.  Follow Up Recommendations  Skilled nursing-short term rehab (<3 hours/day) Can patient physically be transported by private vehicle: No   Assistance Recommended at Discharge Frequent or constant Supervision/Assistance  Patient can return home with the following Two people to help with walking and/or transfers;Two people to help with bathing/dressing/bathroom;Direct supervision/assist for medications management;Direct supervision/assist for financial management;Assist for transportation;Help with stairs or ramp for entrance;Assistance with feeding;Assistance with cooking/housework   Equipment Recommendations  Other (comment) (TBD at next facility)    Recommendations for Other Services       Precautions / Restrictions Precautions Precautions: Fall Restrictions Weight Bearing Restrictions: No     Mobility  Bed Mobility Overal bed mobility: Needs Assistance Bed Mobility: Supine to Sit, Sit to Supine Rolling: Min assist (with railing) Sidelying to sit: Mod assist Supine to sit: Supervision (with railing) Sit to supine: Min assist   General bed mobility comments: Pt required physicial assist at trunk    Transfers Overall transfer level: Needs assistance Equipment used: Rolling walker (2 wheels) Transfers: Sit to/from Stand Sit to Stand: +2 physical assistance, Max assist, From elevated surface     Squat pivot transfers: Mod assist, +2 physical assistance    Lateral/Scoot Transfers: Supervision General transfer comment: verbal cues for technique and safety    Ambulation/Gait                   Stairs             Wheelchair Mobility  Modified Rankin (Stroke Patients Only)       Balance Overall balance assessment: Needs assistance Sitting-balance support: Feet supported, Bilateral upper extremity supported Sitting balance-Leahy Scale: Good Sitting  balance - Comments: steady sitting at edge of bed and able to shift forwards appropriately to scoot sideways   Standing balance support: Bilateral upper extremity supported, Reliant on assistive device for balance Standing balance-Leahy Scale: Poor Standing balance comment: Able to stand upright at RW ~30 seconds prior to fatiquing                            Cognition Arousal/Alertness: Awake/alert Behavior During Therapy: Flat affect Overall Cognitive Status: Within Functional Limits for tasks assessed Area of Impairment: Orientation, Attention, Following commands, Safety/judgement, Awareness, Problem solving, Memory                 Orientation Level: Disoriented to, Time, Situation Current Attention Level: Focused     Safety/Judgement: Decreased awareness of deficits Awareness: Intellectual Problem Solving: Slow processing, Requires verbal cues General Comments:  (fear of falling, limiting progression)        Exercises      General Comments General comments (skin integrity, edema, etc.):  (Purewick in place. 96% on RA)      Pertinent Vitals/Pain Pain Assessment Pain Assessment: No/denies pain    Home Living                          Prior Function            PT Goals (current goals can now be found in the care plan section) Acute Rehab PT Goals Patient Stated Goal: eat lunch    Frequency    Min 2X/week      PT Plan Current plan remains appropriate    Co-evaluation PT/OT/SLP Co-Evaluation/Treatment: Yes Reason for Co-Treatment: Complexity of the patient's impairments (multi-system involvement);For patient/therapist safety PT goals addressed during session: Mobility/safety with mobility;Balance;Proper use of DME        AM-PAC PT "6 Clicks" Mobility   Outcome Measure  Help needed turning from your back to your side while in a flat bed without using bedrails?: A Lot Help needed moving from lying on your back to sitting on  the side of a flat bed without using bedrails?: A Lot Help needed moving to and from a bed to a chair (including a wheelchair)?: Total Help needed standing up from a chair using your arms (e.g., wheelchair or bedside chair)?: Total Help needed to walk in hospital room?: Total Help needed climbing 3-5 steps with a railing? : Total 6 Click Score: 8    End of Session Equipment Utilized During Treatment: Gait belt Activity Tolerance: Patient limited by fatigue;Patient tolerated treatment well Patient left: with call bell/phone within reach;in bed;with family/visitor present;with bed alarm set (fall mat at Right side of bed) Nurse Communication: Mobility status PT Visit Diagnosis: Unsteadiness on feet (R26.81);Muscle weakness (generalized) (M62.81);Difficulty in walking, not elsewhere classified (R26.2)     Time: 2426-8341 PT Time Calculation (min) (ACUTE ONLY): 33 min  Charges:  $Therapeutic Activity: 8-22 mins                    Mikel Cella, PTA    Josie Dixon 12/24/2021, 12:44 PM

## 2021-12-24 NOTE — Progress Notes (Signed)
Progress Note   Patient: [name]. JQB:[id number] DOB: 04-Aug-1965 DOA: 12/20/2021     4 DOS: the patient was seen and examined on 12/24/2021   Brief hospital course: 56 year old man with ADD, bipolar disorder, dementia, type 2 diabetes mellitus, CKD stage II, hypertension, neuropathy, schizoaffective disorder.  Patient was transferred from [facility] to [place] [facility] to obtain ECT treatments.  Psychiatry will try to start ECT treatments on Wednesday or Friday.  Patient initially had [facility] for encephalopathy, DKA, acute kidney injury and rhabdomyolysis.  Patient was found to have [facility] spotted fever and aspiration pneumonia.  Patient completed doxycycline for [facility] spotted fever and IV Zosyn for aspiration pneumonia.  Assessment and Plan: * Toxic metabolic encephalopathy Seen by psychiatry and they will plan to do ECT at some point.  Patient on Abilify and Ativan.  Empirically on high-dose thiamine.  Answers questions appropriately but simple with his answers.  Aspiration pneumonia ([place]) IV Zosyn completed 7-day course on 12/21/2021.  Diet dysphagia 2 diet with nectar thick liquids as per speech therapy.  [facility] spotted fever) Completed 5 days of doxycycline on 12/18/2021  Generalized weakness Physical therapy recommending rehab.  Patient needed help to stand up and pivot back into the bed with physical therapy while is in the room.  Hyponatremia Last sodium normal range.  Thyroid nodule Will need outpatient evaluation and ultrasound for this.  Type 2 diabetes mellitus with hyperlipidemia (HCC) Last hemoglobin A1c elevated at 8.0.  Patient on 35 units of Semglee insulin and sliding scale.  Holding statin with recent rhabdomyolysis.  Pressure injury of skin Present on admission.  Numerous areas of the body.  See full description below.  Acute kidney injury superimposed on CKD ([place]) Acute kidney injury has resolved.   Patient has underlying chronic kidney disease stage II with a creatinine of 1.27.  (Creatinine was as high as 3.24 at [facility])  Dementia without behavioral disturbance [facility]) Listed in the problem list from back in 2019.  TSH normal range, B12 normal range and RPR nonreactive.  Anemia of chronic disease Hemoglobin stable.  Last hemoglobin 12.4.        Subjective: Patient was sitting on commode and was trying to have a bowel movement.  Did have a small bowel movement.  It was okay for me to do a disimpaction.  There was soft stool in the rectal vault and no impaction.  Sent over to [place] [facility] for ECT treatments  Physical Exam: Vitals:   12/23/21 1604 12/23/21 1957 12/24/21 0330 12/24/21 0511  BP: 131/84 109/78  (!) 145/90  Pulse: 89 95  87  Resp: '18 17  17  '$ Temp: 98.6 F (37 C) 98.2 F (36.8 C)  (!) 97.5 F (36.4 C)  TempSrc:      SpO2: 97% 100%  100%  Weight:   84 kg    Physical Exam HENT:     Head: Normocephalic.     Mouth/Throat:     Pharynx: No oropharyngeal exudate.  Eyes:     General: Lids are normal.     Conjunctiva/sclera: Conjunctivae normal.  Cardiovascular:     Rate and Rhythm: Normal rate and regular rhythm.     Heart sounds: Normal heart sounds, S1 normal and S2 normal.  Pulmonary:     Breath sounds: Normal breath sounds. No decreased breath sounds, wheezing, rhonchi or rales.  Abdominal:     Palpations: Abdomen is soft.     Tenderness:  There is no abdominal tenderness.     Comments: Rectal exam soft stool in the rectal vault.  Musculoskeletal:     Right lower leg: No swelling.     Left lower leg: No swelling.  Skin:    General: Skin is warm.     Findings: No rash.  Neurological:     Mental Status: He is alert.     Comments: Follows some simple commands, able to straight leg raise.     Data Reviewed: Creatinine 1.27 with a GFR greater than 60, magnesium 1.8, potassium 3.6, sodium 137  Family Communication: I updated patient's  sister on the phone yesterday evening.  Disposition: Status is: Inpatient Remains inpatient appropriate because: Awaiting to start ECT treatments.  Planned Discharge Destination: Skilled nursing facility    Time spent: 27 minutes  Author: Loletha Grayer, MD 12/24/2021 11:24 AM  For on call review [email or web address].

## 2021-12-24 NOTE — Plan of Care (Signed)
  Problem: Clinical Measurements: Goal: Ability to maintain clinical measurements within normal limits will improve Outcome: Progressing   Problem: Clinical Measurements: Goal: Will remain free from infection Outcome: Progressing   Problem: Clinical Measurements: Goal: Diagnostic test results will improve Outcome: Progressing   

## 2021-12-24 NOTE — Progress Notes (Signed)
Occupational Therapy Treatment Patient Details Name: [name]. MRN: [id number] DOB: July 28, 1965 Today's Date: 12/24/2021   History of present illness Pt is a 56 year old male who was admitted to [facility] on 12/01/2021 for altered mental status. Pt with long history of chronic psychotic disorder as well as dyslipidemia, hyperlipidemia, GERD, chronic kidney disease, BPH and type 2 diabetes mellitus with stage 4 chronic kidney disease, with long term current use of insulin. He was brought to [facility] where initially noted to be encephalopathic, DKA, AKI and rhabdomyolysis.  Slowly with fluid and insulin DKA, AKI and rhabdomyolysis resolved.  He was later also diagnosed with [facility] spotted fever for which she received 5 days of doxycycline and hospital course complicated by aspiration pneumonia requiring IV Zosyn.  His mentation was thought secondary to due to underlying psych issue as his MRI and EEG was negative.  Rest of the metabolic work-up was also negative. He transfered to [facility] on 12/20/2021 for ECT. He had been receiving PT/OT at [facility] progressed mobility to getting on B LE (crouched posture) x 5 with scoot pivot transfer to chair.   OT comments  Upon entering session, pt resting in bed and agreeable to OT/PT co-treatment to maximize safety. Pt endorsing needing have a BM and requesting to use BSC. Pt required set up A for self-feeding, Max A for LB dressing, and Max A for peri care at bed level. Pt with heavy posterior lean upon standing and unable to tolerate standing for >30 seconds before requiring seated rest break. Pt continues to endorse fear of falling with OOB mobility. MD present during session to check on pt's progress. Pt was motivated to participate in therapy. Pt is making progress toward goal completion. D/C recommendation remains appropriate. OT will continue to follow acutely.    Recommendations for follow up therapy are one component of a  multi-disciplinary discharge planning process, led by the attending physician.  Recommendations may be updated based on patient status, additional functional criteria and insurance authorization.    Follow Up Recommendations  Skilled nursing-short term rehab (<3 hours/day)    Assistance Recommended at Discharge Frequent or constant Supervision/Assistance  Patient can return home with the following  Two people to help with walking and/or transfers;Two people to help with bathing/dressing/bathroom;Assistance with cooking/housework;Assistance with feeding;Direct supervision/assist for medications management;Direct supervision/assist for financial management;Assist for transportation;Help with stairs or ramp for entrance   Equipment Recommendations  Other (comment) (defer to next venue of care)    Recommendations for Other Services      Precautions / Restrictions Precautions Precautions: Fall Restrictions Weight Bearing Restrictions: No       Mobility Bed Mobility Overal bed mobility: Needs Assistance Bed Mobility: Supine to Sit, Sit to Supine Rolling: Min assist Sidelying to sit: Mod assist Supine to sit: Supervision Sit to supine: Min assist Sit to sidelying: Mod assist, +2 for physical assistance General bed mobility comments: Pt required physicial assist at trunk    Transfers Overall transfer level: Needs assistance Equipment used: Rolling walker (2 wheels) Transfers: Sit to/from Stand Sit to Stand: +2 physical assistance, Max assist, From elevated surface   Squat pivot transfers: Mod assist, +2 physical assistance (from EOB <> BSC)       General transfer comment: verbal cues for technique and safety     Balance Overall balance assessment: Needs assistance Sitting-balance support: Feet supported, Bilateral upper extremity supported Sitting balance-Leahy Scale: Good Sitting balance - Comments: supervision static sitting balance  Standing balance support: Bilateral  upper extremity supported, Reliant on assistive device for balance Standing balance-Leahy Scale: Poor Standing balance comment: difficulty tolerating standing >30 seconds                           ADL either performed or assessed with clinical judgement   ADL Overall ADL's : Needs assistance/impaired Eating/Feeding: Set up;Bed level Eating/Feeding Details (indicate cue type and reason): set up for self-feeing with thickened apple juice                 Lower Body Dressing: Maximal assistance;Bed level Lower Body Dressing Details (indicate cue type and reason): Max A to don socks at bed level     Toileting- Clothing Manipulation and Hygiene: Bed level;Maximal assistance Toileting - Clothing Manipulation Details (indicate cue type and reason): Max A for posterior hygiene at bed level            Extremity/Trunk Assessment Upper Extremity Assessment Upper Extremity Assessment: Generalized weakness   Lower Extremity Assessment Lower Extremity Assessment: Generalized weakness   Cervical / Trunk Assessment Cervical / Trunk Assessment: Normal    Vision Baseline Vision/History: 1 Wears glasses Vision Assessment?: Vision impaired- to be further tested in functional context   Perception     Praxis      Cognition Arousal/Alertness: Awake/alert Behavior During Therapy: Flat affect Overall Cognitive Status: History of cognitive impairments - at baseline Area of Impairment: Orientation, Attention, Following commands, Safety/judgement, Awareness, Problem solving, Memory                 Orientation Level: Disoriented to, Time, Situation Current Attention Level: Focused Memory: Decreased short-term memory Following Commands: Follows one step commands inconsistently, Follows one step commands with increased time Safety/Judgement: Decreased awareness of deficits Awareness: Intellectual Problem Solving: Slow processing, Requires verbal cues General Comments:  fear of falling with OOB mobility        Exercises      Shoulder Instructions       General Comments  (Purewick in place. 96% on RA)    Pertinent Vitals/ Pain       Pain Assessment Pain Assessment: Faces Faces Pain Scale: Hurts a little bit Pain Location: buttocks while sitting on BSC Pain Descriptors / Indicators: Grimacing Pain Intervention(s): Monitored during session, Repositioned  Home Living                                          Prior Functioning/Environment              Frequency  Min 2X/week        Progress Toward Goals  OT Goals(current goals can now be found in the care plan section)  Progress towards OT goals: Progressing toward goals  Acute Rehab OT Goals Patient Stated Goal: none stated OT Goal Formulation: With patient/family Time For Goal Achievement: 01/04/22 Potential to Achieve Goals: [place] Discharge plan remains appropriate;Frequency remains appropriate    Co-evaluation    PT/OT/SLP Co-Evaluation/Treatment: Yes Reason for Co-Treatment: Complexity of the patient's impairments (multi-system involvement);For patient/therapist safety PT goals addressed during session: Mobility/safety with mobility;Balance;Proper use of DME OT goals addressed during session: ADL's and self-care;Proper use of Adaptive equipment and DME;Strengthening/ROM      AM-PAC OT "6 Clicks" Daily Activity     Outcome Measure   Help from another person eating meals?: A  Little Help from another person taking care of personal grooming?: A Little Help from another person toileting, which includes using toliet, bedpan, or urinal?: A Lot Help from another person bathing (including washing, rinsing, drying)?: A Lot Help from another person to put on and taking off regular upper body clothing?: A Little Help from another person to put on and taking off regular lower body clothing?: A Lot 6 Click Score: 15    End of Session Equipment Utilized  During Treatment: Gait belt;Rolling walker (2 wheels)  OT Visit Diagnosis: Unsteadiness on feet (R26.81);Other abnormalities of gait and mobility (R26.89);Muscle weakness (generalized) (M62.81);Low vision, both eyes (H54.2);Other symptoms and signs involving cognitive function   Activity Tolerance Patient limited by fatigue   Patient Left in bed;with call bell/phone within reach;with bed alarm set   Nurse Communication Mobility status        Time: 3833-3832 OT Time Calculation (min): 33 min  Charges: OT General Charges $OT Visit: 1 Visit OT Treatments $Self Care/Home Management : 8-22 mins   [facility] MS, OTR/L ascom [phone]  12/24/21, 2:28 PM

## 2021-12-24 NOTE — Progress Notes (Signed)
Patient ID: [name]., male   DOB: 18-Jul-1965, 56 y.o.   MRN: [id number] ECT: Follow-up with this 56 year old man with a history of chronic mental health problems who was transferred from [facility] for consideration of ECT.  Patient continues to have waxing and waning mental state all very consistent with catatonia.  Needs a lot of care.  I spoke with his sister who is acting as Mudlogger for the patient given his lack of capacity.  She is agreeable to the plan for electroconvulsive therapy.  She is concerned that staff may not be aware of his mental health limitations and how they play into behaviors on the ward.  Offered empathy and wished that we could manage him on a med psych unit but we do not have that option at this point.  Chart reviewed.  He is currently on the Ativan for the catatonia which may be partly effective but if we do ECT we will have to discontinue that.  I will review the old chart and see if there are other psychiatric medicines that might be worthwhile to restart.  We will tried to hope that we can get him on the schedule by Friday.

## 2021-12-25 ENCOUNTER — Encounter: Payer: Self-pay | Admitting: Internal Medicine

## 2021-12-25 ENCOUNTER — Inpatient Hospital Stay: Payer: Medicare Other

## 2021-12-25 ENCOUNTER — Other Ambulatory Visit: Payer: Self-pay | Admitting: Family Medicine

## 2021-12-25 ENCOUNTER — Inpatient Hospital Stay: Payer: Medicare Other | Admitting: Registered Nurse

## 2021-12-25 DIAGNOSIS — F0390 Unspecified dementia without behavioral disturbance: Secondary | ICD-10-CM | POA: Diagnosis not present

## 2021-12-25 DIAGNOSIS — E1169 Type 2 diabetes mellitus with other specified complication: Secondary | ICD-10-CM | POA: Diagnosis not present

## 2021-12-25 DIAGNOSIS — F202 Catatonic schizophrenia: Secondary | ICD-10-CM

## 2021-12-25 DIAGNOSIS — G928 Other toxic encephalopathy: Secondary | ICD-10-CM | POA: Diagnosis not present

## 2021-12-25 DIAGNOSIS — J690 Pneumonitis due to inhalation of food and vomit: Secondary | ICD-10-CM | POA: Diagnosis not present

## 2021-12-25 DIAGNOSIS — Z794 Long term (current) use of insulin: Secondary | ICD-10-CM

## 2021-12-25 LAB — GLUCOSE, CAPILLARY
Glucose-Capillary: 148 mg/dL — ABNORMAL HIGH (ref 70–99)
Glucose-Capillary: 122 mg/dL — ABNORMAL HIGH (ref 70–99)
Glucose-Capillary: 111 mg/dL — ABNORMAL HIGH (ref 70–99)
Glucose-Capillary: 138 mg/dL — ABNORMAL HIGH (ref 70–99)

## 2021-12-25 MED ORDER — METHOHEXITAL SODIUM 100 MG/10ML IV SOSY
PREFILLED_SYRINGE | INTRAVENOUS | Status: DC | PRN
  Administered 2021-12-25: 80 mg via INTRAVENOUS

## 2021-12-25 MED ORDER — ENOXAPARIN SODIUM 40 MG/0.4ML IJ SOSY
40.0000 mg | PREFILLED_SYRINGE | Freq: Every day | INTRAMUSCULAR | Status: DC
  Administered 2021-12-25 – 2022-01-01 (×8): 40 mg via SUBCUTANEOUS
  Filled 2021-12-25 (×8): qty 0.4

## 2021-12-25 MED ORDER — SODIUM CHLORIDE 0.9 % IV SOLN: INTRAVENOUS | Status: DC | PRN

## 2021-12-25 MED ORDER — SUCCINYLCHOLINE CHLORIDE 200 MG/10ML IV SOSY
PREFILLED_SYRINGE | INTRAVENOUS | Status: DC | PRN
  Administered 2021-12-25: 100 mg via INTRAVENOUS

## 2021-12-25 MED ORDER — SODIUM CHLORIDE 0.9 % IV SOLN
500.0000 mL | Freq: Once | INTRAVENOUS | Status: AC
  Administered 2021-12-25: 500 mL via INTRAVENOUS

## 2021-12-25 MED ORDER — LABETALOL HCL 5 MG/ML IV SOLN
INTRAVENOUS | Status: DC | PRN
  Administered 2021-12-25: 10 mg via INTRAVENOUS

## 2021-12-25 NOTE — Anesthesia Postprocedure Evaluation (Signed)
Anesthesia Post Note  Patient: [name].  Procedure(s) Performed: ECT TX  Patient location during evaluation: PACU Anesthesia Type: General Level of consciousness: awake and alert Pain management: pain level controlled Vital Signs Assessment: post-procedure vital signs reviewed and stable Respiratory status: spontaneous breathing, nonlabored ventilation, respiratory function stable and patient connected to nasal cannula oxygen Cardiovascular status: blood pressure returned to baseline and stable Postop Assessment: no apparent nausea or vomiting Anesthetic complications: no   No notable events documented.   Last Vitals:  Vitals:   12/25/21 1600 12/25/21 1615  BP: 115/78 119/79  Pulse: 89 85  Resp: (!) 23 19  Temp: 36.4 C   SpO2: 99% 97%    Last Pain:  Vitals:   12/25/21 1600  TempSrc:   PainSc: 0-No pain                 Dimas Millin

## 2021-12-25 NOTE — Progress Notes (Signed)
  Progress Note   Patient: [name]. MVV:[id number] DOB: [birth year] DOA: 12/20/2021     5 DOS: the patient was seen and examined on 12/25/2021   Brief hospital course: 56 year old man with ADD, bipolar disorder, dementia, type 2 diabetes mellitus, CKD stage II, hypertension, neuropathy, schizoaffective disorder.  Patient was transferred from [facility] to [place] [facility] to obtain ECT treatments.  Psychiatry will try to start ECT treatments on Wednesday or Friday.  Patient initially had [facility] for encephalopathy, DKA, acute kidney injury and rhabdomyolysis.  Patient was found to have [facility] spotted fever and aspiration pneumonia.  Patient completed doxycycline for [facility] spotted fever and IV Zosyn for aspiration pneumonia.  Assessment and Plan: * Toxic metabolic encephalopathy Dementia without behavioral disturbance ([place]) Patient completed a course of high-dose thiamine.  Patient has been seen by psychiatry, ECT started today.  Aspiration pneumonia ([place]) [facility] spotted fever) Completed Zosyn and doxycycline.  Generalized weakness Continue PT/OT.  Hyponatremia Improved.  Thyroid nodule Outpatient follow-up with PCP.  Type 2 diabetes mellitus with hyperlipidemia (HCC) Last hemoglobin A1c elevated at 8.0.  Continue current regimen.   Pressure injury of skin Continue to follow.  Acute kidney injury superimposed on CKD 3A(HCC) Anemia of chronic disease Hemoglobin stable.  Function has improved to baseline.       Subjective:  Patient doing well, no confusion or agitation.  Physical Exam: Vitals:   12/25/21 0729 12/25/21 0853 12/25/21 1256 12/25/21 1409  BP:  136/85 115/73 125/83  Pulse:  97 94 92  Resp:  '16 16 16  '$ Temp:  97.9 F (36.6 C) 98.8 F (37.1 C) (!) 97.4 F (36.3 C)  TempSrc:    Temporal  SpO2:  98% 99% 99%  Weight: 82.3 kg   82.3 kg  Height:    '6\' 3"'$  (1.905 m)   General exam: Appears calm and  comfortable  Respiratory system: Clear to auscultation. Respiratory effort normal. Cardiovascular system: S1 & S2 heard, RRR. No JVD, murmurs, rubs, gallops or clicks. No pedal edema. Gastrointestinal system: Abdomen is nondistended, soft and nontender. No organomegaly or masses felt. Normal bowel sounds heard. Central nervous system: Alert and oriented. No focal neurological deficits. Extremities: Symmetric 5 x 5 power. Skin: No rashes, lesions or ulcers Psychiatry: Flat affect.  Data Reviewed:  Lab results reviewed.  Family Communication:   Disposition: Status is: Inpatient Remains inpatient appropriate because: Severity of disease, inpatient procedure.  Planned Discharge Destination: Skilled nursing facility    Time spent: 35 minutes  Author: Sharen Hones, MD 12/25/2021 2:51 PM  For on call review [email or web address].

## 2021-12-25 NOTE — Procedures (Signed)
ECT SERVICES Physician's Interval Evaluation & Treatment Note  Patient Identification: [name]. MRN:  [id number] Date of Evaluation:  12/25/2021 TX #: 1  MADRS:   MMSE:   P.E. Findings:  Patient is bed bound but not in bad shape no specific physical abnormality  Psychiatric Interval Note:  Very withdrawn and blunted somewhat confused speech  Subjective:  Patient is a 56 y.o. male seen for evaluation for Electroconvulsive Therapy. No specific complaint  Treatment Summary:   '[]'$   Right Unilateral             '[x]'$  Bilateral   % Energy : 1.0 ms 50%   Impedance: 2040 ohms  Seizure Energy Index: 9541 V squared  Postictal Suppression Index: 48%  Seizure Concordance Index: 91%  Medications  Pre Shock: Brevital 80 mg succinylcholine 100 mg  Post Shock:    Seizure Duration: EMG 6 seconds but EEG 54 seconds   Comments: Good quality seizure recommend continue with this index course next treatment Friday  Lungs:  '[x]'$   Clear to auscultation               '[]'$  Other:   Heart:    '[x]'$   Regular rhythm             '[]'$  irregular rhythm    '[x]'$   Previous H&P reviewed, patient examined and there are NO CHANGES                 '[]'$   Previous H&P reviewed, patient examined and there are changes noted.   Alethia Berthold, MD 9/6/20237:24 PM

## 2021-12-25 NOTE — H&P (Signed)
Rollins Wrightson Alek Borges. is an 56 y.o. male.   Chief Complaint: Fatigued no specific complaint from patient barely responsive HPI: History of chronic psychotic disorder currently presenting with catatonic like symptoms  Past Medical History:  Diagnosis Date   ADD (attention deficit disorder)    Anxiety    Arthritis    right hip   Bipolar 1 disorder ([place])    Blood in urine    CKD (chronic kidney disease), stage III ([place])    Creatinine elevation    Dementia ([place])    "early onset" (08/04/2017)   Depression    bipolar [facility]   Diabetes mellitus without complication ([place])    Family history of anesthesia complication    pt is unsure , but pt father may have been difficult to arouse    HCAP (healthcare-associated pneumonia) 10/31/2012   History of kidney stones    Hypertension    Hypogonadism male    Liver fatty degeneration    Microscopic hematuria    hereditary s/p Urology eval   Neuromuscular disorder ([place])    feet neuropathy    Osteoarthritis of right hip 11/28/2011   2012 2015 s/p THR Severe  Dr Novella Olive     Pleural effusion 11/02/2012   Pneumonia 10-2012   Pneumonia, organism unspecified([place]) 11/02/2012   Polysubstance dependence, non-opioid, in remission ([place])    remote   Primary osteoarthritis of left hip 05/22/2015   PTSD (post-traumatic stress disorder)    SOCIAL ANXIETY DISORDER    Schizoaffective disorder ([place])    Substance abuse ([place])    Suicide attempt by multiple drug overdose ([place]) 12-30-15   Grieving his cat's death 07-07-2015    Past Surgical History:  Procedure Laterality Date   BACK SURGERY     CLOSED REDUCTION METACARPAL WITH PERCUTANEOUS PINNING Right    LUMBAR [place] HIP ARTHROPLASTY Right 08/16/2013   Procedure: TOTAL HIP ARTHROPLASTY ANTERIOR APPROACH;  Surgeon: Hessie Dibble, MD;  Location: [facility];  Service: Orthopedics;  Laterality: Right;   TOTAL HIP ARTHROPLASTY Left 05/22/2015   Procedure: TOTAL HIP ARTHROPLASTY  ANTERIOR APPROACH;  Surgeon: Melrose Nakayama, MD;  Location: [facility];  Service: Orthopedics;  Laterality: Left;    Family History  Problem Relation Age of Onset   Diabetes Father    Cancer Mother        died of melanoma with mets   Cervical cancer Sister    Diabetes Sister    Other Neg Hx        hypogonadism   Colon cancer Neg Hx    Colon polyps Neg Hx    Esophageal cancer Neg Hx    Rectal cancer Neg Hx    Stomach cancer Neg Hx    Social History:  reports that he quit smoking about 2 years ago. His smoking use included cigarettes. He has never used smokeless tobacco. He reports that he does not currently use alcohol. He reports that he does not use drugs.  Allergies:  Allergies  Allergen Reactions   Vicodin [Hydrocodone-Acetaminophen] Itching    Medications Prior to Admission  Medication Sig Dispense Refill   ARIPiprazole (ABILIFY) 5 MG tablet Take 1 tablet (5 mg total) by mouth daily.     atorvastatin (LIPITOR) 10 MG tablet Take 1 tablet (10 mg total) by mouth at bedtime. 90 tablet 1   Continuous Blood Gluc Receiver (FREESTYLE LIBRE READER) DEVI Use as directed 1 each each   Continuous Blood Gluc  Sensor (FREESTYLE LIBRE 14 DAY SENSOR) MISC Use as directed 2 each 4   Continuous Blood Gluc Sensor (FREESTYLE LIBRE 2 SENSOR) MISC by Does not apply route.     fenofibrate (TRICOR) 145 MG tablet TAKE 1 TABLET BY MOUTH DAILY (Patient taking differently: Take 145 mg by mouth daily.) 90 tablet 3   glucose blood (ACCU-CHEK GUIDE) test strip CHECK SUGAR FOUR TIMES DAILY 100 each 12   insulin glargine-yfgn (SEMGLEE) 100 UNIT/ML injection Inject 0.35 mLs (35 Units total) into the skin daily. 10 mL 11   Insulin Pen Needle (B-D UF III MINI PEN NEEDLES) 31G X 5 MM MISC USE 4 TIMES DAILY 120 each 12   LORazepam (ATIVAN) 2 MG/ML injection Inject 1 mL (2 mg total) into the vein 3 (three) times daily. 1 mL 0   naloxone (NARCAN) nasal spray 4 mg/0.1 mL Place 1 spray into the nose daily as needed (For  overdose).     Nutritional Supplements (FEEDING SUPPLEMENT, NEPRO CARB STEADY,) LIQD Take 237 mLs by mouth 2 (two) times daily between meals.  0   [EXPIRED] piperacillin-tazobactam (ZOSYN) 3.375 GM/50ML IVPB Inject 50 mLs (3.375 g total) into the vein every 8 (eight) hours for 1 day. 50 mL     Results for orders placed or performed during the hospital encounter of 12/20/21 (from the past 48 hour(s))  Glucose, capillary     Status: Abnormal   Collection Time: 12/23/21  8:02 PM  Result Value Ref Range   Glucose-Capillary 220 (H) 70 - 99 mg/dL    Comment: Glucose reference range applies only to samples taken after fasting for at least 8 hours.  Basic metabolic panel     Status: Abnormal   Collection Time: 12/24/21  6:45 AM  Result Value Ref Range   Sodium 137 135 - 145 mmol/L   Potassium 3.6 3.5 - 5.1 mmol/L   Chloride 104 98 - 111 mmol/L   CO2 25 22 - 32 mmol/L   Glucose, Bld 163 (H) 70 - 99 mg/dL    Comment: Glucose reference range applies only to samples taken after fasting for at least 8 hours.   BUN 22 (H) 6 - 20 mg/dL   Creatinine, Ser 1.27 (H) 0.61 - 1.24 mg/dL   Calcium 9.7 8.9 - 10.3 mg/dL   GFR, Estimated >60 >60 mL/min    Comment: (NOTE) Calculated using the CKD-EPI Creatinine Equation (2021)    Anion gap 8 5 - 15    Comment: Performed at [facility], [address] [place], [place] [address]  CBC     Status: Abnormal   Collection Time: 12/24/21  6:45 AM  Result Value Ref Range   WBC 4.6 4.0 - 10.5 K/uL   RBC 4.52 4.22 - 5.81 MIL/uL   Hemoglobin 12.7 (L) 13.0 - 17.0 g/dL   HCT 38.3 (L) 39.0 - 52.0 %   MCV 84.7 80.0 - 100.0 fL   MCH 28.1 26.0 - 34.0 pg   MCHC 33.2 30.0 - 36.0 g/dL   RDW 14.6 11.5 - 15.5 %   Platelets 218 150 - 400 K/uL   nRBC 0.0 0.0 - 0.2 %    Comment: Performed at [facility], [address] [place], [place] [address]  Magnesium     Status: None   Collection Time: 12/24/21  6:45 AM  Result Value Ref Range   Magnesium 1.8  1.7 - 2.4 mg/dL    Comment: Performed at [facility], [place],  [place] [address]  Glucose, capillary     Status: Abnormal   Collection Time: 12/24/21  7:40 AM  Result Value Ref Range   Glucose-Capillary 169 (H) 70 - 99 mg/dL    Comment: Glucose reference range applies only to samples taken after fasting for at least 8 hours.  Glucose, capillary     Status: Abnormal   Collection Time: 12/24/21 12:28 PM  Result Value Ref Range   Glucose-Capillary 157 (H) 70 - 99 mg/dL    Comment: Glucose reference range applies only to samples taken after fasting for at least 8 hours.  Glucose, capillary     Status: Abnormal   Collection Time: 12/24/21  3:48 PM  Result Value Ref Range   Glucose-Capillary 181 (H) 70 - 99 mg/dL    Comment: Glucose reference range applies only to samples taken after fasting for at least 8 hours.  Glucose, capillary     Status: Abnormal   Collection Time: 12/24/21  8:24 PM  Result Value Ref Range   Glucose-Capillary 208 (H) 70 - 99 mg/dL    Comment: Glucose reference range applies only to samples taken after fasting for at least 8 hours.  Glucose, capillary     Status: Abnormal   Collection Time: 12/25/21  8:55 AM  Result Value Ref Range   Glucose-Capillary 148 (H) 70 - 99 mg/dL    Comment: Glucose reference range applies only to samples taken after fasting for at least 8 hours.  Glucose, capillary     Status: Abnormal   Collection Time: 12/25/21 12:57 PM  Result Value Ref Range   Glucose-Capillary 122 (H) 70 - 99 mg/dL    Comment: Glucose reference range applies only to samples taken after fasting for at least 8 hours.  Glucose, capillary     Status: Abnormal   Collection Time: 12/25/21  4:30 PM  Result Value Ref Range   Glucose-Capillary 111 (H) 70 - 99 mg/dL    Comment: Glucose reference range applies only to samples taken after fasting for at least 8 hours.   No results found.  Review of Systems  Unable to perform ROS: Psychiatric  disorder  Skin: Negative.     Blood pressure 119/79, pulse 85, temperature 97.6 F (36.4 C), resp. rate 19, height '6\' 3"'$  (1.905 m), weight 82.3 kg, SpO2 97 %. Physical Exam Constitutional:      Appearance: He is well-developed.  HENT:     Head: Normocephalic and atraumatic.  Eyes:     Conjunctiva/sclera: Conjunctivae normal.     Pupils: Pupils are equal, round, and reactive to light.  Cardiovascular:     Heart sounds: Normal heart sounds.  Pulmonary:     Effort: Pulmonary effort is normal.  Abdominal:     Palpations: Abdomen is soft.  Musculoskeletal:        General: Normal range of motion.     Cervical back: Normal range of motion.  Skin:    General: Skin is warm and dry.  Neurological:     Mental Status: He is alert.  Psychiatric:        Attention and Perception: He is inattentive.        Mood and Affect: Affect is blunt.        Speech: Speech is delayed.      Assessment/Plan Beginning ECT today with consent given by his sister  Alethia Berthold, MD 12/25/2021, 7:23 PM

## 2021-12-25 NOTE — Progress Notes (Signed)
Physical Therapy Treatment Patient Details Name: [name]. MRN: [id number] DOB: [birth year] Today's Date: 12/25/2021   History of Present Illness Pt is a 56 year old male who was admitted to [facility] on 12/01/2021 for altered mental status. Pt with long history of chronic psychotic disorder as well as dyslipidemia, hyperlipidemia, GERD, chronic kidney disease, BPH and type 2 diabetes mellitus with stage 4 chronic kidney disease, with long term current use of insulin. He was brought to [facility] where initially noted to be encephalopathic, DKA, AKI and rhabdomyolysis.  Slowly with fluid and insulin DKA, AKI and rhabdomyolysis resolved.  He was later also diagnosed with [facility] spotted fever for which she received 5 days of doxycycline and hospital course complicated by aspiration pneumonia requiring IV Zosyn.  His mentation was thought secondary to due to underlying psych issue as his MRI and EEG was negative.  Rest of the metabolic work-up was also negative. He transfered to [facility] on 12/20/2021 for ECT. He had been receiving PT/OT at [facility] progressed mobility to getting on B LE (crouched posture) x 5 with scoot pivot transfer to chair.    PT Comments    Improved tolerance for mobility this date. Pt able to transfer to edge of bed with rail and MinA. MaxA to raise from low surface, Mod/MinA from elevated surface. Pt able to advance with RW ~47f to bedside chair and MinA for safety/balance. Pt tolerated ~20 minutes up in chair and assisted back to bed by this therapist with c/o nausea.  BP 138/93, HR 95bpm. Pt also c/o hunger due to NPO status awaiting first ECT session. Pt is showing functional progress, was very pleasant and engaged during PT tx, will plan to continue per POC.   Recommendations for follow up therapy are one component of a multi-disciplinary discharge planning process, led by the attending physician.  Recommendations may be updated based on patient status,  additional functional criteria and insurance authorization.  Follow Up Recommendations  Skilled nursing-short term rehab (<3 hours/day) Can patient physically be transported by private vehicle: No   Assistance Recommended at Discharge Frequent or constant Supervision/Assistance  Patient can return home with the following A lot of help with walking and/or transfers;Assistance with cooking/housework;Assist for transportation;Help with stairs or ramp for entrance   Equipment Recommendations  Other (comment) (TBD at next venue unless d/c plans change)    Recommendations for Other Services       Precautions / Restrictions Precautions Precautions: Fall Restrictions Weight Bearing Restrictions: No     Mobility  Bed Mobility Overal bed mobility: Needs Assistance Bed Mobility: Supine to Sit Rolling: Min assist (with use of rail) Sidelying to sit: Min guard (with rail) Supine to sit: Supervision, Min assist Sit to supine: Supervision Sit to sidelying: Supervision (with rail) General bed mobility comments: Impproved from previous session    Transfers Overall transfer level: Needs assistance Equipment used: Rolling walker (2 wheels) Transfers: Sit to/from Stand Sit to Stand: Min assist, From elevated surface           General transfer comment: Pt with improved ability to functionally transfer    Ambulation/Gait Ambulation/Gait assistance: Min assist Gait Distance (Feet): 4 Feet Assistive device: Rolling walker (2 wheels) Gait Pattern/deviations: Step-to pattern Gait velocity: decreased     General Gait Details: Pt able to take several steps with RW bed<>chair   Stairs             Wheelchair Mobility    Modified Rankin (Stroke  Patients Only)       Balance Overall balance assessment: Needs assistance Sitting-balance support: Single extremity supported, Feet supported Sitting balance-Leahy Scale: Good Sitting balance - Comments: supervision static sitting  balance   Standing balance support: Bilateral upper extremity supported, Reliant on assistive device for balance Standing balance-Leahy Scale: Fair                              Cognition Arousal/Alertness: Awake/alert Behavior During Therapy: WFL for tasks assessed/performed (Pleasant and engaged in conversation) Overall Cognitive Status: Within Functional Limits for tasks assessed                                          Exercises      General Comments General comments (skin integrity, edema, etc.): c/o nausea after transferring to chair, no dizziness, BP 138/93, HR 95bpm      Pertinent Vitals/Pain Pain Assessment Pain Assessment: No/denies pain    Home Living                          Prior Function            PT Goals (current goals can now be found in the care plan section) Acute Rehab PT Goals Patient Stated Goal: eat lunch Progress towards PT goals: Progressing toward goals    Frequency    Min 2X/week      PT Plan Current plan remains appropriate    Co-evaluation              AM-PAC PT "6 Clicks" Mobility   Outcome Measure  Help needed turning from your back to your side while in a flat bed without using bedrails?: A Little Help needed moving from lying on your back to sitting on the side of a flat bed without using bedrails?: A Little Help needed moving to and from a bed to a chair (including a wheelchair)?: A Lot Help needed standing up from a chair using your arms (e.g., wheelchair or bedside chair)?: A Lot Help needed to walk in hospital room?: A Lot Help needed climbing 3-5 steps with a railing? : A Lot 6 Click Score: 14    End of Session Equipment Utilized During Treatment: Gait belt Activity Tolerance: Patient limited by fatigue;Patient tolerated treatment well Patient left: with call bell/phone within reach;in bed;with bed alarm set Nurse Communication: Mobility status;Other (comment) (c/o  nausea) PT Visit Diagnosis: Unsteadiness on feet (R26.81);Muscle weakness (generalized) (M62.81);Difficulty in walking, not elsewhere classified (R26.2)     Time: 8315-1761 PT Time Calculation (min) (ACUTE ONLY): 49 min  Charges:  $Therapeutic Exercise: 8-22 mins $Therapeutic Activity: 23-37 mins                    Mikel Cella, PTA    Josie Dixon 12/25/2021, 12:16 PM

## 2021-12-25 NOTE — Anesthesia Preprocedure Evaluation (Signed)
Anesthesia Evaluation  Patient identified by MRN, date of birth, ID band Patient awake    Reviewed: Allergy & Precautions, NPO status , Patient's Chart, lab work & pertinent test results  Airway Mallampati: III  TM Distance: >3 FB Neck ROM: full    Dental  (+) Edentulous Upper, Poor Dentition, Missing   Pulmonary pneumonia, former smoker,    Pulmonary exam normal        Cardiovascular hypertension, Normal cardiovascular exam     Neuro/Psych PSYCHIATRIC DISORDERS Anxiety Bipolar Disorder Schizophrenia Dementia negative neurological ROS     GI/Hepatic Neg liver ROS, GERD  Controlled,  Endo/Other  negative endocrine ROSdiabetes, Well Controlled, Insulin Dependent  Renal/GU Renal InsufficiencyRenal disease  negative genitourinary   Musculoskeletal  (+) Arthritis ,   Abdominal Normal abdominal exam  (+)   Peds  Hematology  (+) Blood dyscrasia, anemia ,   Anesthesia Other Findings Note per chart review: 56 year old male who was admitted to [facility] on 12/01/2021 for altered mental status. Pt with long history of chronic psychotic disorder as well as dyslipidemia, hyperlipidemia, GERD, chronic kidney disease, BPH and type 2 diabetes mellitus with stage 4 chronic kidney disease, with long term current use of insulin. He was brought to [facility] where initially noted to be encephalopathic, DKA, AKI and rhabdomyolysis.  Slowly with fluid and insulin DKA, AKI and rhabdomyolysis resolved.  He was later also diagnosed with [facility] spotted fever for which she received 5 days of doxycycline and hospital course complicated by aspiration pneumonia requiring IV Zosyn.  His mentation was thought secondary to due to underlying psych issue as his MRI and EEG was negative.  Rest of the metabolic work-up was also negative. He transfered to [facility] on 12/20/2021 for ECT.     Past Medical History: No date: ADD (attention deficit  disorder) No date: Anxiety No date: Arthritis     Comment:  right hip No date: Bipolar 1 disorder (HCC) No date: Blood in urine No date: CKD (chronic kidney disease), stage III (HCC) No date: Creatinine elevation No date: Dementia [facility])     Comment:  "early onset" (08/04/2017) No date: Depression     Comment:  bipolar [facility] No date: Diabetes mellitus without complication ([place]) No date: Family history of anesthesia complication     Comment:  pt is unsure , but pt father may have been difficult to               arouse  10/31/2012: HCAP (healthcare-associated pneumonia) No date: History of kidney stones No date: Hypertension No date: Hypogonadism male No date: Liver fatty degeneration No date: Microscopic hematuria     Comment:  hereditary s/p Urology eval No date: Neuromuscular disorder ([place])     Comment:  feet neuropathy  11/28/2011: Osteoarthritis of right hip     Comment:  2012 2015 s/p THR Severe  Dr Novella Olive   11/02/2012: Pleural effusion 10-2012: Pneumonia 11/02/2012: Pneumonia, organism unspecified(486) No date: Polysubstance dependence, non-opioid, in remission ([place])     Comment:  remote 05/22/2015: Primary osteoarthritis of left hip No date: PTSD (post-traumatic stress disorder)     Comment:  SOCIAL ANXIETY DISORDER  No date: Schizoaffective disorder ([place]) No date: Substance abuse ([place]) 12/26/2015: Suicide attempt by multiple drug overdose [facility])     Comment:  Grieving his cat's death 2015/07/18  Past Surgical History: No date: BACK SURGERY No date: CLOSED REDUCTION METACARPAL WITH PERCUTANEOUS PINNING; Right No date: LUMBAR DISC SURGERY No date: TONSILLECTOMY 08/16/2013:  TOTAL HIP ARTHROPLASTY; Right     Comment:  Procedure: TOTAL HIP ARTHROPLASTY ANTERIOR APPROACH;                Surgeon: Hessie Dibble, MD;  Location: [place];                Service: Orthopedics;  Laterality: Right; 05/22/2015: TOTAL HIP ARTHROPLASTY; Left     Comment:  Procedure: TOTAL HIP  ARTHROPLASTY ANTERIOR APPROACH;                Surgeon: Melrose Nakayama, MD;  Location: [place];  Service:               Orthopedics;  Laterality: Left;  BMI    Body Mass Index: 22.68 kg/m      Reproductive/Obstetrics negative OB ROS                             Anesthesia Physical Anesthesia Plan  ASA: 3  Anesthesia Plan: General   Post-op Pain Management: Minimal or no pain anticipated   Induction: Intravenous  PONV Risk Score and Plan: Propofol infusion and TIVA  Airway Management Planned: Natural Airway and Mask  Additional Equipment:   Intra-op Plan:   Post-operative Plan:   Informed Consent: I have reviewed the patients History and Physical, chart, labs and discussed the procedure including the risks, benefits and alternatives for the proposed anesthesia with the patient or authorized representative who has indicated his/her understanding and acceptance.   Patient has DNR.  Suspend DNR.   Dental Advisory Given and Consent reviewed with POA  Plan Discussed with: Anesthesiologist, CRNA and Surgeon  Anesthesia Plan Comments:         Anesthesia Quick Evaluation

## 2021-12-25 NOTE — Transfer of Care (Signed)
Immediate Anesthesia Transfer of Care Note  Patient: [name].  Procedure(s) Performed: ECT TX  Patient Location: PACU  Anesthesia Type:General  Level of Consciousness: drowsy  Airway & Oxygen Therapy: Patient Spontanous Breathing and Patient connected to nasal cannula oxygen  Post-op Assessment: Report given to RN and Post -op Vital signs reviewed and stable  Post vital signs: Reviewed and stable  Last Vitals:  Vitals Value Taken Time  BP 140/95 12/25/21 1530  Temp    Pulse 87 12/25/21 1532  Resp 18 12/25/21 1532  SpO2 100 % 12/25/21 1532  Vitals shown include unvalidated device data.  Last Pain:  Vitals:   12/25/21 1503  TempSrc:   PainSc: 0-No pain      Patients Stated Pain Goal: 0 (37/16/96 7893)  Complications: No notable events documented.

## 2021-12-26 ENCOUNTER — Other Ambulatory Visit: Payer: Self-pay | Admitting: Psychiatry

## 2021-12-26 ENCOUNTER — Other Ambulatory Visit: Payer: Self-pay | Admitting: Family Medicine

## 2021-12-26 DIAGNOSIS — G928 Other toxic encephalopathy: Secondary | ICD-10-CM | POA: Diagnosis not present

## 2021-12-26 DIAGNOSIS — N189 Chronic kidney disease, unspecified: Secondary | ICD-10-CM | POA: Diagnosis not present

## 2021-12-26 DIAGNOSIS — N179 Acute kidney failure, unspecified: Secondary | ICD-10-CM | POA: Diagnosis not present

## 2021-12-26 DIAGNOSIS — I152 Hypertension secondary to endocrine disorders: Secondary | ICD-10-CM

## 2021-12-26 DIAGNOSIS — J690 Pneumonitis due to inhalation of food and vomit: Secondary | ICD-10-CM | POA: Diagnosis not present

## 2021-12-26 LAB — GLUCOSE, CAPILLARY
Glucose-Capillary: 111 mg/dL — ABNORMAL HIGH (ref 70–99)
Glucose-Capillary: 145 mg/dL — ABNORMAL HIGH (ref 70–99)
Glucose-Capillary: 96 mg/dL (ref 70–99)
Glucose-Capillary: 166 mg/dL — ABNORMAL HIGH (ref 70–99)
Glucose-Capillary: 140 mg/dL — ABNORMAL HIGH (ref 70–99)

## 2021-12-26 NOTE — Progress Notes (Addendum)
Progress Note   Patient: [name]. XBW:[id number] DOB: [birth year] DOA: 12/20/2021     6 DOS: the patient was seen and examined on 12/26/2021   Brief hospital course: 56 year old man with ADD, bipolar disorder, dementia, type 2 diabetes mellitus, CKD stage II, hypertension, neuropathy, schizoaffective disorder.  Patient was transferred from [facility] over to [facility] to obtain ECT treatments.  Psychiatry will try to start ECT treatments on Wednesday or Friday.  Patient initially had [facility] for encephalopathy, DKA, acute kidney injury and rhabdomyolysis.  Patient was found to have [facility] spotted fever and aspiration pneumonia.  Patient completed doxycycline for [facility] spotted fever and IV Zosyn for aspiration pneumonia. ECT started on 12/25/2021.  Assessment and Plan: * Toxic metabolic encephalopathy Dementia without behavioral disturbance [facility]) Patient is a followed by psychiatry, [place] started yesterday.  Continue per psychiatry.  Mental status improved.   Aspiration pneumonia ([place]) [facility] spotted fever) Completed antibiotics, discussed with speech therapy, given patient frequent ECTs, would not consider advance diet yet.   Generalized weakness PT and OT.   Hyponatremia Improved.   Thyroid nodule Outpatient follow-up with PCP.   Type 2 diabetes mellitus with hyperlipidemia (HCC) Last hemoglobin A1c elevated at 8.0.  No change in treatment regimen.     Pressure injury of skin Pressure Injury 12/04/21 Sacrum Mid Stage 2 -  Partial thickness loss of dermis presenting as a shallow open injury with a red, pink wound bed without slough. (Active)  12/04/21 0800  Location: Sacrum  Location Orientation: Mid  Staging: Stage 2 -  Partial thickness loss of dermis presenting as a shallow open injury with a red, pink wound bed without slough.  Wound Description (Comments):   Present on Admission: Yes     Pressure Injury 12/02/21  Hip Right Stage 2 -  Partial thickness loss of dermis presenting as a shallow open injury with a red, pink wound bed without slough. (Active)  12/02/21 1646  Location: Hip (right trochanter)  Location Orientation: Right  Staging: Stage 2 -  Partial thickness loss of dermis presenting as a shallow open injury with a red, pink wound bed without slough.  Wound Description (Comments):   Present on Admission: Yes     Pressure Injury 12/02/21 Elbow Right Stage 2 -  Partial thickness loss of dermis presenting as a shallow open injury with a red, pink wound bed without slough. (Active)  12/02/21 1647  Location: Elbow  Location Orientation: Right  Staging: Stage 2 -  Partial thickness loss of dermis presenting as a shallow open injury with a red, pink wound bed without slough.  Wound Description (Comments):   Present on Admission: Yes     Pressure Injury 12/11/21 Heel Right Deep Tissue Pressure Injury - Purple or maroon localized area of discolored intact skin or blood-filled blister due to damage of underlying soft tissue from pressure and/or shear. (Active)  12/11/21 2202  Location: Heel  Location Orientation: Right  Staging: Deep Tissue Pressure Injury - Purple or maroon localized area of discolored intact skin or blood-filled blister due to damage of underlying soft tissue from pressure and/or shear.  Wound Description (Comments):   Present on Admission: No   Continue to follow.   Acute kidney injury superimposed on CKD 3A(HCC) Anemia of chronic disease Hemoglobin stable.  Function has improved to baseline.      Subjective:  Patient has been doing well, mental status improved.  Slept well last night.  Good appetite.  Physical Exam: Vitals:   12/25/21 1615 12/25/21 2007 12/26/21 0356 12/26/21 0845  BP: 119/79 123/84 126/76 116/83  Pulse: 85 92 87 90  Resp: '19 17 18 18  '$ Temp:  98.7 F (37.1 C) 98.1 F (36.7 C) 98.5 F (36.9 C)  TempSrc:      SpO2: 97% 96% 100% 99%  Weight:       Height:       General exam: Appears calm and comfortable  Respiratory system: Clear to auscultation. Respiratory effort normal. Cardiovascular system: S1 & S2 heard, RRR. No JVD, murmurs, rubs, gallops or clicks. No pedal edema. Gastrointestinal system: Abdomen is nondistended, soft and nontender. No organomegaly or masses felt. Normal bowel sounds heard. Central nervous system: Alert and oriented x3. No focal neurological deficits. Extremities: Symmetric 5 x 5 power. Skin: No rashes, lesions or ulcers Psychiatry: Flat affect  Data Reviewed:  There are no new results to review at this time.  Family Communication: None  Disposition: Status is: Inpatient Remains inpatient appropriate because: Inpatient procedures.  Planned Discharge Destination: Home    Time spent: 29 minutes  Author: Sharen Hones, MD 12/26/2021 10:14 AM  For on call review [email or web address].

## 2021-12-26 NOTE — Progress Notes (Signed)
Physical Therapy Treatment Patient Details Name: [name]. MRN: [id number] DOB: January 16, 1966 Today's Date: 12/26/2021   History of Present Illness Pt is a 56 year old male who was admitted to [facility] on 12/01/2021 for altered mental status. Pt with long history of chronic psychotic disorder as well as dyslipidemia, hyperlipidemia, GERD, chronic kidney disease, BPH and type 2 diabetes mellitus with stage 4 chronic kidney disease, with long term current use of insulin. He was brought to [facility] where initially noted to be encephalopathic, DKA, AKI and rhabdomyolysis.  Slowly with fluid and insulin DKA, AKI and rhabdomyolysis resolved.  He was later also diagnosed with [facility] spotted fever for which she received 5 days of doxycycline and hospital course complicated by aspiration pneumonia requiring IV Zosyn.  His mentation was thought secondary to due to underlying psych issue as his MRI and EEG was negative.  Rest of the metabolic work-up was also negative. He transfered to [facility] on 12/20/2021 for ECT. He had been receiving PT/OT at [facility] progressed mobility to getting on B LE (crouched posture) x 5 with scoot pivot transfer to chair.    PT Comments    Pt with increased tolerance for mobility this session. Mood also appears to be improving, joking with therapist. Pt able to demonstrate sit to stand transfers with Min/CGA. Progressed gait distance to 31f with RW and CGA for safety. No LOB or knee buckling, fear of falling seems to be subsiding. Pt positioned in chair and educated on importance of sitting up, however pt ended up putting self back to bed without calling for assist. Will continue to progress per POC. Pt anxious to return home, will need to continue to assess safe d/c options and family ability to assist if pt refuses SNF.    Recommendations for follow up therapy are one component of a multi-disciplinary discharge planning process, led by the attending  physician.  Recommendations may be updated based on patient status, additional functional criteria and insurance authorization.  Follow Up Recommendations  Skilled nursing-short term rehab (<3 hours/day) Can patient physically be transported by private vehicle: No   Assistance Recommended at Discharge Frequent or constant Supervision/Assistance  Patient can return home with the following A lot of help with walking and/or transfers;Assistance with cooking/housework;Assist for transportation;Help with stairs or ramp for entrance   Equipment Recommendations  Other (comment)    Recommendations for Other Services       Precautions / Restrictions Precautions Precautions: Fall Restrictions Weight Bearing Restrictions: No     Mobility  Bed Mobility Overal bed mobility: Needs Assistance Bed Mobility: Supine to Sit Rolling: Min assist Sidelying to sit: Supervision Supine to sit: Supervision, Min assist Sit to supine: Supervision Sit to sidelying: Supervision General bed mobility comments: Impproved from previous session    Transfers Overall transfer level: Needs assistance Equipment used: Rolling walker (2 wheels) Transfers: Sit to/from Stand Sit to Stand: Min assist, From elevated surface           General transfer comment: Pt with improved ability to functionally transfer    Ambulation/Gait Ambulation/Gait assistance: Min guard Gait Distance (Feet): 30 Feet Assistive device: Rolling walker (2 wheels) Gait Pattern/deviations: Step-to pattern           Stairs             Wheelchair Mobility    Modified Rankin (Stroke Patients Only)       Balance  Cognition Arousal/Alertness: Awake/alert Behavior During Therapy: WFL for tasks assessed/performed Overall Cognitive Status: Within Functional Limits for tasks assessed ([place])                     Current Attention Level: Focused                     Exercises      General Comments General comments (skin integrity, edema, etc.): Pt educated on importance of increasing out of bed activity.      Pertinent Vitals/Pain Pain Assessment Pain Assessment: No/denies pain    Home Living                          Prior Function            PT Goals (current goals can now be found in the care plan section) Acute Rehab PT Goals Patient Stated Goal: go home Progress towards PT goals: Progressing toward goals    Frequency    Min 2X/week      PT Plan Current plan remains appropriate    Co-evaluation              AM-PAC PT "6 Clicks" Mobility   Outcome Measure  Help needed turning from your back to your side while in a flat bed without using bedrails?: A Little Help needed moving from lying on your back to sitting on the side of a flat bed without using bedrails?: A Little Help needed moving to and from a bed to a chair (including a wheelchair)?: A Little Help needed standing up from a chair using your arms (e.g., wheelchair or bedside chair)?: A Little Help needed to walk in hospital room?: A Little Help needed climbing 3-5 steps with a railing? : A Lot 6 Click Score: 17    End of Session Equipment Utilized During Treatment: Gait belt Activity Tolerance: Patient limited by fatigue;Patient tolerated treatment well Patient left: with call bell/phone within reach;in bed;with bed alarm set Nurse Communication: Mobility status PT Visit Diagnosis: Unsteadiness on feet (R26.81);Muscle weakness (generalized) (M62.81);Difficulty in walking, not elsewhere classified (R26.2)     Time: 5027-7412 PT Time Calculation (min) (ACUTE ONLY): 33 min  Charges:  $Gait Training: 8-22 mins $Therapeutic Activity: 8-22 mins                    Mikel Cella, PTA    Josie Dixon 12/26/2021, 2:41 PM

## 2021-12-26 NOTE — Progress Notes (Signed)
SLP F/UNote  Patient Details Name: [name]. MRN: [id number] DOB: 08/24/1965   Cancelled treatment:       Reason Eval/Treat Not Completed:  (chart reviewed; consulted MD re: pt's status and pending MBSS, baseline dysphagia) Per discussion w/ MD, recommend maintaining pt's current diet (dysphagia diet) in the setting of his pending initiation of ECT txs (and while ongoing d/t drowsiness, NPO prior to txs schedule) and his Baseline Dysphagia (MBSS on 12/18/21 revealed Silent Aspiration of thin liquids w/ poor follow through w/ a strategy w/out much cueing). MD agreed w/ maintaining current diet w/ general aspiration precautions.  Will monitor pt's status and at end of ECT txs complete further objective assessment of swallowing in hopes to upgrade his diet.     Orinda Kenner, MS, CCC-SLP Speech Language Pathologist Rehab Services; [place] [phone] (ascom) Raquel Sayres 12/26/2021, 3:27 PM

## 2021-12-26 NOTE — Progress Notes (Signed)
Occupational Therapy Treatment Patient Details Name: [name]. MRN: [id number] DOB: [birth year] Today's Date: 12/26/2021   History of present illness Pt is a 56 year old male who was admitted to [facility] on 12/01/2021 for altered mental status. Pt with long history of chronic psychotic disorder as well as dyslipidemia, hyperlipidemia, GERD, chronic kidney disease, BPH and type 2 diabetes mellitus with stage 4 chronic kidney disease, with long term current use of insulin. He was brought to [facility] where initially noted to be encephalopathic, DKA, AKI and rhabdomyolysis.  Slowly with fluid and insulin DKA, AKI and rhabdomyolysis resolved.  He was later also diagnosed with [facility] spotted fever for which she received 5 days of doxycycline and hospital course complicated by aspiration pneumonia requiring IV Zosyn.  His mentation was thought secondary to due to underlying psych issue as his MRI and EEG was negative.  Rest of the metabolic work-up was also negative. He transfered to [facility] on 12/20/2021 for ECT. He had been receiving PT/OT at [facility] progressed mobility to getting on B LE (crouched posture) x 5 with scoot pivot transfer to chair.   OT comments  Pt seen for OT tx this date. Pt found in bed reporting he felt the need to pass gas and did not realize he needed to have a bowel movement. Large BM found in bed. When asked whether the pt notified nursing, the pt stated that it just happened. With cues, pt rolled to his R side and with set up he was able to assist with pericare, ultimately requiring MAX A in side lying and MAX A in standing, requiring BUE support on RW for balance/safety. Pt stood with CGA-MIN A with RW. Pt demo'd 1 LOB requiring him to sit promptly on the EOB when he attempted to reach for something despite VC for safety. Pt tolerated ambulating ~5' with CGA-MIN A + RW to recliner. Pt demonstrating progress towards goals, requiring less overall assist this  session. Continue to recommend SNF at this time for additional rehab upon discharge.      Recommendations for follow up therapy are one component of a multi-disciplinary discharge planning process, led by the attending physician.  Recommendations may be updated based on patient status, additional functional criteria and insurance authorization.    Follow Up Recommendations  Skilled nursing-short term rehab (<3 hours/day)    Assistance Recommended at Discharge Frequent or constant Supervision/Assistance  Patient can return home with the following  Assistance with cooking/housework;Assistance with feeding;Direct supervision/assist for medications management;Direct supervision/assist for financial management;Assist for transportation;Help with stairs or ramp for entrance;A lot of help with bathing/dressing/bathroom;A lot of help with walking and/or transfers   Equipment Recommendations  Other (comment) (defer to next venue)    Recommendations for Other Services      Precautions / Restrictions Precautions Precautions: Fall Restrictions Weight Bearing Restrictions: No       Mobility Bed Mobility Overal bed mobility: Needs Assistance Bed Mobility: Sidelying to Sit, Rolling Rolling: Supervision Sidelying to sit: Supervision            Transfers Overall transfer level: Needs assistance Equipment used: Rolling walker (2 wheels) Transfers: Sit to/from Stand Sit to Stand: Min guard, Min assist           General transfer comment: CGA-MIN A from EOB with RW x2     Balance Overall balance assessment: Needs assistance Sitting-balance support: Single extremity supported, Feet supported Sitting balance-Leahy Scale: Good     Standing balance support:  Bilateral upper extremity supported, Reliant on assistive device for balance Standing balance-Leahy Scale: Poor Standing balance comment: pt required BUE Support on the RW during pericare to maintain static standing balance with 1  LOB resulting in need to sit promptly on the EOB                           ADL either performed or assessed with clinical judgement   ADL Overall ADL's : Needs assistance/impaired     Grooming: Sitting;Set up;Wash/dry hands           Upper Body Dressing : Sitting;Minimal assistance           Toileting- Clothing Manipulation and Hygiene: Bed level;Maximal assistance;Sit to/from stand Toileting - Clothing Manipulation Details (indicate cue type and reason): Pt able to initiate pericare in sidelying but ultimately requiring MAX A in sidelying and in standing with BUE support on the RW to complete     Functional mobility during ADLs: Min guard;Minimal assistance;Rolling walker (2 wheels);Cueing for safety      Extremity/Trunk Assessment              Vision       Perception     Praxis      Cognition Arousal/Alertness: Awake/alert Behavior During Therapy: Flat affect Overall Cognitive Status: No family/caregiver present to determine baseline cognitive functioning                                 General Comments: Pt required intermittent cues for safety        Exercises Other Exercises Other Exercises: Pt ambulated 5' with from end of the bed to the recliner with CGA + VC for RW safetyt    Shoulder Instructions       General Comments Pt educated on importance of increasing out of bed activity.    Pertinent Vitals/ Pain       Pain Assessment Pain Assessment: Faces Faces Pain Scale: Hurts little more Pain Location: perineal area during pericare Pain Descriptors / Indicators: Grimacing Pain Intervention(s): Monitored during session, Repositioned  Home Living                                          Prior Functioning/Environment              Frequency  Min 2X/week        Progress Toward Goals  OT Goals(current goals can now be found in the care plan section)  Progress towards OT goals: Progressing  toward goals  Acute Rehab OT Goals Patient Stated Goal: none stated OT Goal Formulation: With patient/family Time For Goal Achievement: 01/04/22 Potential to Achieve Goals: [place] Discharge plan remains appropriate;Frequency remains appropriate    Co-evaluation                 AM-PAC OT "6 Clicks" Daily Activity     Outcome Measure   Help from another person eating meals?: None Help from another person taking care of personal grooming?: A Little Help from another person toileting, which includes using toliet, bedpan, or urinal?: A Lot Help from another person bathing (including washing, rinsing, drying)?: A Lot Help from another person to put on and taking off regular upper body clothing?: A Little Help from another person to put on  and taking off regular lower body clothing?: A Lot 6 Click Score: 16    End of Session Equipment Utilized During Treatment: Gait belt;Rolling walker (2 wheels)  OT Visit Diagnosis: Unsteadiness on feet (R26.81);Other abnormalities of gait and mobility (R26.89);Muscle weakness (generalized) (M62.81);Low vision, both eyes (H54.2);Other symptoms and signs involving cognitive function   Activity Tolerance Patient tolerated treatment well   Patient Left in chair;with call bell/phone within reach;with chair alarm set   Nurse Communication          Time: 2277-3750 OT Time Calculation (min): 19 min  Charges: OT General Charges $OT Visit: 1 Visit OT Treatments $Self Care/Home Management : 8-22 mins  Ardeth Perfect., MPH, MS, OTR/L ascom [phone] 12/26/21, 4:11 PM

## 2021-12-27 ENCOUNTER — Encounter: Payer: Self-pay | Admitting: Internal Medicine

## 2021-12-27 ENCOUNTER — Inpatient Hospital Stay: Payer: Medicare Other

## 2021-12-27 ENCOUNTER — Inpatient Hospital Stay: Payer: Medicare Other | Admitting: Anesthesiology

## 2021-12-27 ENCOUNTER — Other Ambulatory Visit: Payer: Self-pay | Admitting: Psychiatry

## 2021-12-27 ENCOUNTER — Other Ambulatory Visit: Payer: Self-pay

## 2021-12-27 DIAGNOSIS — N189 Chronic kidney disease, unspecified: Secondary | ICD-10-CM | POA: Diagnosis not present

## 2021-12-27 DIAGNOSIS — J690 Pneumonitis due to inhalation of food and vomit: Secondary | ICD-10-CM | POA: Diagnosis not present

## 2021-12-27 DIAGNOSIS — N179 Acute kidney failure, unspecified: Secondary | ICD-10-CM | POA: Diagnosis not present

## 2021-12-27 DIAGNOSIS — G928 Other toxic encephalopathy: Secondary | ICD-10-CM | POA: Diagnosis not present

## 2021-12-27 LAB — GLUCOSE, CAPILLARY
Glucose-Capillary: 101 mg/dL — ABNORMAL HIGH (ref 70–99)
Glucose-Capillary: 136 mg/dL — ABNORMAL HIGH (ref 70–99)
Glucose-Capillary: 180 mg/dL — ABNORMAL HIGH (ref 70–99)

## 2021-12-27 MED ORDER — ARIPIPRAZOLE 10 MG PO TABS
10.0000 mg | ORAL_TABLET | Freq: Every day | ORAL | Status: DC
  Administered 2021-12-28 – 2022-01-03 (×7): 10 mg via ORAL
  Filled 2021-12-27 (×8): qty 1

## 2021-12-27 MED ORDER — SODIUM CHLORIDE 0.9 % IV SOLN
500.0000 mL | Freq: Once | INTRAVENOUS | Status: AC
Start: 2021-12-27 — End: 2021-12-27
  Administered 2021-12-27: 500 mL via INTRAVENOUS

## 2021-12-27 MED ORDER — SUCCINYLCHOLINE CHLORIDE 200 MG/10ML IV SOSY
PREFILLED_SYRINGE | INTRAVENOUS | Status: DC | PRN
  Administered 2021-12-27: 100 mg via INTRAVENOUS

## 2021-12-27 MED ORDER — METHOHEXITAL SODIUM 100 MG/10ML IV SOSY
PREFILLED_SYRINGE | INTRAVENOUS | Status: DC | PRN
  Administered 2021-12-27: 80 mg via INTRAVENOUS

## 2021-12-27 MED ORDER — LABETALOL HCL 5 MG/ML IV SOLN
INTRAVENOUS | Status: DC | PRN
  Administered 2021-12-27: 10 mg via INTRAVENOUS

## 2021-12-27 NOTE — Anesthesia Postprocedure Evaluation (Signed)
Anesthesia Post Note  Patient: [name].  Procedure(s) Performed: ECT TX  Patient location during evaluation: PACU Anesthesia Type: General Level of consciousness: awake and alert Pain management: pain level controlled Vital Signs Assessment: post-procedure vital signs reviewed and stable Respiratory status: spontaneous breathing, nonlabored ventilation, respiratory function stable and patient connected to nasal cannula oxygen Cardiovascular status: blood pressure returned to baseline and stable Postop Assessment: no apparent nausea or vomiting Anesthetic complications: no   No notable events documented.   Last Vitals:  Vitals:   12/27/21 1310 12/27/21 1320  BP: 129/81 119/79  Pulse: 86   Resp: 16   Temp:  36.7 C  SpO2: 98%     Last Pain:  Vitals:   12/27/21 1320  TempSrc:   PainSc: 0-No pain                 Precious Haws Takeshi Teasdale

## 2021-12-27 NOTE — Progress Notes (Signed)
SLP Cancellation Note  Patient Details Name: [name]. MRN: [id number] DOB: 17-May-1965   Cancelled treatment:       Reason Eval/Treat Not Completed: Medical issues which prohibited therapy  Pt currently NPO for ECT.   Laurie Lovejoy B. Rutherford Nail, M.S., CCC-SLP, Mining engineer Certified Brain Injury [place]  [place] Office [phone] Ascom [phone] Fax [phone]

## 2021-12-27 NOTE — Progress Notes (Signed)
  Progress Note   Patient: [name]. [id number]:[id number] DOB: Nov 25, 1965 DOA: 12/20/2021     7 DOS: the patient was seen and examined on 12/27/2021   Brief hospital course: 56 year old man with ADD, bipolar disorder, dementia, type 2 diabetes mellitus, CKD stage II, hypertension, neuropathy, schizoaffective disorder.  Patient was transferred from [facility] to [facility] to obtain ECT treatments.  Psychiatry will try to start ECT treatments on Wednesday or Friday.  Patient initially had [facility] for encephalopathy, DKA, acute kidney injury and rhabdomyolysis.  Patient was found to have [facility] spotted fever and aspiration pneumonia.  Patient completed doxycycline for [facility] spotted fever and IV Zosyn for aspiration pneumonia. ECT started on 12/25/2021.  Assessment and Plan: * Toxic metabolic encephalopathy Dementia without behavioral disturbance [facility]) Patient is a followed by psychiatry, [place] started yesterday.  Continue per psychiatry.  Mental status improved.   Aspiration pneumonia ([place]) [facility] spotted fever) Completed antibiotics, discussed with speech therapy, given patient frequent ECTs, would not consider advance diet yet.   Generalized weakness PT and OT.   Hyponatremia Improved.   Thyroid nodule Outpatient follow-up with PCP.   Type 2 diabetes mellitus with hyperlipidemia (HCC) Last hemoglobin A1c elevated at 8.0.  No change in treatment regimen.    Acute kidney injury superimposed on CKD 3A(HCC) Anemia of chronic disease Hemoglobin stable.  Function has improved to baseline.   Multiple skin pressure ulcers POA. Stage II at the medial sacrum. Stage II right hip. Stage II right elbow. Deep tissue injury right heel.  We will ask wound care to look at right heel.     Subjective:  Patient doing well today, pending ECT, no complaints.  Physical Exam: Vitals:   12/27/21 0452 12/27/21 0557 12/27/21 0804 12/27/21  1009  BP: 120/85  116/74 131/81  Pulse: 95  90 96  Resp: '18  16 16  '$ Temp: 98.1 F (36.7 C)  (!) 97.5 F (36.4 C) 98.4 F (36.9 C)  TempSrc:    Oral  SpO2: 97%  99% 97%  Weight:  79.8 kg  79.8 kg  Height:    '6\' 3"'$  (1.905 m)   General exam: Appears calm and comfortable  Respiratory system: Clear to auscultation. Respiratory effort normal. Cardiovascular system: S1 & S2 heard, RRR. No JVD, murmurs, rubs, gallops or clicks. No pedal edema. Gastrointestinal system: Abdomen is nondistended, soft and nontender. No organomegaly or masses felt. Normal bowel sounds heard. Central nervous system: Alert and oriented. No focal neurological deficits. Extremities: Symmetric 5 x 5 power. Skin: No rashes, lesions or ulcers Psychiatry: Judgement and insight appear normal. Mood & affect appropriate.   Data Reviewed:  There are no new results to review at this time.  Family Communication: None  Disposition: Status is: Inpatient Remains inpatient appropriate because: Severity of disease, inpatient procedure.  Planned Discharge Destination: Home    Time spent: 35 minutes  Author: Sharen Hones, MD 12/27/2021 10:33 AM  For on call review [email or web address].

## 2021-12-27 NOTE — H&P (Signed)
[name]. is an 56 y.o. male.   Chief Complaint: Patient has no specific new complaint HPI: History of chronic psychotic disorder.  Past Medical History:  Diagnosis Date   ADD (attention deficit disorder)    Anxiety    Arthritis    right hip   Bipolar 1 disorder (HCC)    Blood in urine    CKD (chronic kidney disease), stage III (HCC)    Creatinine elevation    Dementia ([place])    "early onset" (08/04/2017)   Depression    bipolar [facility]   Diabetes mellitus without complication ([place])    Family history of anesthesia complication    pt is unsure , but pt father may have been difficult to arouse    HCAP (healthcare-associated pneumonia) 10/31/2012   History of kidney stones    Hypertension    Hypogonadism male    Liver fatty degeneration    Microscopic hematuria    hereditary s/p Urology eval   Neuromuscular disorder ([place])    feet neuropathy    Osteoarthritis of right hip 11/28/2011   2012 2015 s/p THR Severe  Dr Novella Olive     Pleural effusion 11/02/2012   Pneumonia 10-2012   Pneumonia, organism unspecified([place]) 11/02/2012   Polysubstance dependence, non-opioid, in remission ([place])    remote   Primary osteoarthritis of left hip 05/22/2015   PTSD (post-traumatic stress disorder)    SOCIAL ANXIETY DISORDER    Schizoaffective disorder ([place])    Substance abuse ([place])    Suicide attempt by multiple drug overdose ([place]) 01/01/16   Grieving his cat's death 07/09/15    Past Surgical History:  Procedure Laterality Date   BACK SURGERY     CLOSED REDUCTION METACARPAL WITH PERCUTANEOUS PINNING Right    LUMBAR [place] HIP ARTHROPLASTY Right 08/16/2013   Procedure: TOTAL HIP ARTHROPLASTY ANTERIOR APPROACH;  Surgeon: Hessie Dibble, MD;  Location: [place];  Service: Orthopedics;  Laterality: Right;   TOTAL HIP ARTHROPLASTY Left 05/22/2015   Procedure: TOTAL HIP ARTHROPLASTY ANTERIOR APPROACH;  Surgeon: Melrose Nakayama, MD;  Location: [place];   Service: Orthopedics;  Laterality: Left;    Family History  Problem Relation Age of Onset   Diabetes Father    Cancer Mother        died of melanoma with mets   Cervical cancer Sister    Diabetes Sister    Other Neg Hx        hypogonadism   Colon cancer Neg Hx    Colon polyps Neg Hx    Esophageal cancer Neg Hx    Rectal cancer Neg Hx    Stomach cancer Neg Hx    Social History:  reports that he quit smoking about 2 years ago. His smoking use included cigarettes. He has never used smokeless tobacco. He reports that he does not currently use alcohol. He reports that he does not use drugs.  Allergies:  Allergies  Allergen Reactions   Vicodin [Hydrocodone-Acetaminophen] Itching    Medications Prior to Admission  Medication Sig Dispense Refill   ARIPiprazole (ABILIFY) 5 MG tablet Take 1 tablet (5 mg total) by mouth daily.     Continuous Blood Gluc Receiver (FREESTYLE LIBRE READER) DEVI Use as directed 1 each each   Continuous Blood Gluc Sensor (FREESTYLE LIBRE 14 DAY SENSOR) MISC Use as directed 2 each 4   Continuous Blood Gluc Sensor (FREESTYLE LIBRE 2 SENSOR) MISC by Does not apply  route.     fenofibrate (TRICOR) 145 MG tablet TAKE 1 TABLET BY MOUTH DAILY (Patient taking differently: Take 145 mg by mouth daily.) 90 tablet 3   glucose blood (ACCU-CHEK GUIDE) test strip CHECK SUGAR FOUR TIMES DAILY 100 each 12   insulin glargine-yfgn (SEMGLEE) 100 UNIT/ML injection Inject 0.35 mLs (35 Units total) into the skin daily. 10 mL 11   Insulin Pen Needle (B-D UF III MINI PEN NEEDLES) 31G X 5 MM MISC USE 4 TIMES DAILY 120 each 12   LORazepam (ATIVAN) 2 MG/ML injection Inject 1 mL (2 mg total) into the vein 3 (three) times daily. 1 mL 0   naloxone (NARCAN) nasal spray 4 mg/0.1 mL Place 1 spray into the nose daily as needed (For overdose).     Nutritional Supplements (FEEDING SUPPLEMENT, NEPRO CARB STEADY,) LIQD Take 237 mLs by mouth 2 (two) times daily between meals.  0   [EXPIRED]  piperacillin-tazobactam (ZOSYN) 3.375 GM/50ML IVPB Inject 50 mLs (3.375 g total) into the vein every 8 (eight) hours for 1 day. 50 mL     Results for orders placed or performed during the hospital encounter of 12/20/21 (from the past 48 hour(s))  Glucose, capillary     Status: Abnormal   Collection Time: 12/25/21  4:30 PM  Result Value Ref Range   Glucose-Capillary 111 (H) 70 - 99 mg/dL    Comment: Glucose reference range applies only to samples taken after fasting for at least 8 hours.  Glucose, capillary     Status: Abnormal   Collection Time: 12/25/21  8:29 PM  Result Value Ref Range   Glucose-Capillary 138 (H) 70 - 99 mg/dL    Comment: Glucose reference range applies only to samples taken after fasting for at least 8 hours.  Glucose, capillary     Status: Abnormal   Collection Time: 12/26/21  8:43 AM  Result Value Ref Range   Glucose-Capillary 145 (H) 70 - 99 mg/dL    Comment: Glucose reference range applies only to samples taken after fasting for at least 8 hours.  Glucose, capillary     Status: None   Collection Time: 12/26/21  1:11 PM  Result Value Ref Range   Glucose-Capillary 96 70 - 99 mg/dL    Comment: Glucose reference range applies only to samples taken after fasting for at least 8 hours.  Glucose, capillary     Status: Abnormal   Collection Time: 12/26/21  4:17 PM  Result Value Ref Range   Glucose-Capillary 166 (H) 70 - 99 mg/dL    Comment: Glucose reference range applies only to samples taken after fasting for at least 8 hours.  Glucose, capillary     Status: Abnormal   Collection Time: 12/26/21  8:46 PM  Result Value Ref Range   Glucose-Capillary 140 (H) 70 - 99 mg/dL    Comment: Glucose reference range applies only to samples taken after fasting for at least 8 hours.  Glucose, capillary     Status: Abnormal   Collection Time: 12/27/21  8:02 AM  Result Value Ref Range   Glucose-Capillary 101 (H) 70 - 99 mg/dL    Comment: Glucose reference range applies only to  samples taken after fasting for at least 8 hours.   No results found.  Review of Systems  Constitutional: Negative.   HENT: Negative.    Eyes: Negative.   Respiratory: Negative.    Cardiovascular: Negative.   Gastrointestinal: Negative.   Musculoskeletal: Negative.   Skin: Negative.   Neurological: Negative.  Psychiatric/Behavioral: Negative.      Blood pressure 119/79, pulse 86, temperature 98 F (36.7 C), resp. rate 16, height '6\' 3"'$  (1.905 m), weight 79.8 kg, SpO2 98 %. Physical Exam Constitutional:      Appearance: He is well-developed.  HENT:     Head: Normocephalic and atraumatic.  Eyes:     Conjunctiva/sclera: Conjunctivae normal.     Pupils: Pupils are equal, round, and reactive to light.  Cardiovascular:     Heart sounds: Normal heart sounds.  Pulmonary:     Effort: Pulmonary effort is normal.  Abdominal:     Palpations: Abdomen is soft.  Musculoskeletal:        General: Normal range of motion.     Cervical back: Normal range of motion.  Skin:    General: Skin is warm and dry.  Neurological:     General: No focal deficit present.     Mental Status: He is alert.  Psychiatric:        Mood and Affect: Mood normal.      Assessment/Plan ECT to get him past catatonia.  Seems to have great improvement already much more alert awake and interactive.  Sister notes this as well but also says he still has moments of psychotic thinking.  I will review his medication and try and make sure he is on what he normally takes at home.  Next ECT still Monday but we will reassess to see if he needs it  Alethia Berthold, MD 12/27/2021, 3:59 PM

## 2021-12-27 NOTE — TOC Progression Note (Signed)
Transition of Care (TOC) - Progression Note    Patient Details  Name: [name]. MRN: [id number] Date of Birth: [birth year]  Transition of Care [facility]) CM/SW Contact  Laurena Slimmer, RN Phone Number: 12/27/2021, 4:38 PM  Clinical Narrative:    Attempt to reach patient in room earlier to discuss discharge plan. Will reattempt.        Expected Discharge Plan and Services                                                 Social Determinants of Health (SDOH) Interventions    Readmission Risk Interventions    01/02/2020    4:09 PM  Readmission Risk Prevention Plan  Transportation Screening Complete  PCP or Specialist Appt within 3-5 Days Complete  HRI or [place] Complete  Social Work Consult for [place] Planning/Counseling Complete  Palliative Care Screening Not Applicable  Medication Review [employer]) Referral to Pharmacy

## 2021-12-27 NOTE — Anesthesia Preprocedure Evaluation (Signed)
Anesthesia Evaluation  Patient identified by MRN, date of birth, ID band Patient awake    Reviewed: Allergy & Precautions, NPO status , Patient's Chart, lab work & pertinent test results  History of Anesthesia Complications Negative for: history of anesthetic complications  Airway Mallampati: III  TM Distance: >3 FB Neck ROM: full    Dental  (+) Edentulous Upper, Poor Dentition, Missing   Pulmonary neg shortness of breath, former smoker,    Pulmonary exam normal        Cardiovascular hypertension, (-) anginaNormal cardiovascular exam     Neuro/Psych PSYCHIATRIC DISORDERS Anxiety Bipolar Disorder Schizophrenia Dementia  Neuromuscular disease    GI/Hepatic Neg liver ROS, GERD  Controlled,  Endo/Other  negative endocrine ROSdiabetes, Well Controlled, Insulin Dependent  Renal/GU Renal InsufficiencyRenal disease  negative genitourinary   Musculoskeletal  (+) Arthritis ,   Abdominal Normal abdominal exam  (+)   Peds  Hematology  (+) Blood dyscrasia, anemia ,   Anesthesia Other Findings Note per chart review: 56 year old male who was admitted to [facility] on 12/01/2021 for altered mental status. Pt with long history of chronic psychotic disorder as well as dyslipidemia, hyperlipidemia, GERD, chronic kidney disease, BPH and type 2 diabetes mellitus with stage 4 chronic kidney disease, with long term current use of insulin. He was brought to [facility] where initially noted to be encephalopathic, DKA, AKI and rhabdomyolysis.  Slowly with fluid and insulin DKA, AKI and rhabdomyolysis resolved.  He was later also diagnosed with [facility] spotted fever for which she received 5 days of doxycycline and hospital course complicated by aspiration pneumonia requiring IV Zosyn.  His mentation was thought secondary to due to underlying psych issue as his MRI and EEG was negative.  Rest of the metabolic work-up was also negative.  He transfered to [facility] on 12/20/2021 for ECT.     Past Medical History: No date: ADD (attention deficit disorder) No date: Anxiety No date: Arthritis     Comment:  right hip No date: Bipolar 1 disorder (HCC) No date: Blood in urine No date: CKD (chronic kidney disease), stage III (HCC) No date: Creatinine elevation No date: Dementia [facility])     Comment:  "early onset" (08/04/2017) No date: Depression     Comment:  bipolar [facility] No date: Diabetes mellitus without complication ([place]) No date: Family history of anesthesia complication     Comment:  pt is unsure , but pt father may have been difficult to               arouse  10/31/2012: HCAP (healthcare-associated pneumonia) No date: History of kidney stones No date: Hypertension No date: Hypogonadism male No date: Liver fatty degeneration No date: Microscopic hematuria     Comment:  hereditary s/p Urology eval No date: Neuromuscular disorder ([place])     Comment:  feet neuropathy  11/28/2011: Osteoarthritis of right hip     Comment:  2012 2015 s/p THR Severe  Dr Novella Olive   11/02/2012: Pleural effusion 10-2012: Pneumonia 11/02/2012: Pneumonia, organism unspecified(486) No date: Polysubstance dependence, non-opioid, in remission ([place])     Comment:  remote 05/22/2015: Primary osteoarthritis of left hip No date: PTSD (post-traumatic stress disorder)     Comment:  SOCIAL ANXIETY DISORDER  No date: Schizoaffective disorder ([place]) No date: Substance abuse ([place]) 12/26/2015: Suicide attempt by multiple drug overdose ([place])     Comment:  Grieving his cat's death 07/13/2015  Past Surgical History: No date: BACK SURGERY No date: CLOSED REDUCTION  METACARPAL WITH PERCUTANEOUS PINNING; Right No date: LUMBAR DISC SURGERY No date: TONSILLECTOMY 08/16/2013: TOTAL HIP ARTHROPLASTY; Right     Comment:  Procedure: TOTAL HIP ARTHROPLASTY ANTERIOR APPROACH;                Surgeon: Hessie Dibble, MD;  Location: [place];                Service: Orthopedics;   Laterality: Right; 05/22/2015: TOTAL HIP ARTHROPLASTY; Left     Comment:  Procedure: TOTAL HIP ARTHROPLASTY ANTERIOR APPROACH;                Surgeon: Melrose Nakayama, MD;  Location: [place];  Service:               Orthopedics;  Laterality: Left;  BMI    Body Mass Index: 22.68 kg/m      Reproductive/Obstetrics negative OB ROS                             Anesthesia Physical  Anesthesia Plan  ASA: 3  Anesthesia Plan: General   Post-op Pain Management: Minimal or no pain anticipated   Induction: Intravenous  PONV Risk Score and Plan: TIVA  Airway Management Planned: Mask  Additional Equipment:   Intra-op Plan:   Post-operative Plan:   Informed Consent: I have reviewed the patients History and Physical, chart, labs and discussed the procedure including the risks, benefits and alternatives for the proposed anesthesia with the patient or authorized representative who has indicated his/her understanding and acceptance.   Patient has DNR.  Suspend DNR.   Dental Advisory Given and Consent reviewed with POA  Plan Discussed with: Anesthesiologist, CRNA and Surgeon  Anesthesia Plan Comments: (Patient consented for risks of anesthesia including but not limited to:  - adverse reactions to medications - risk of airway placement if required - damage to eyes, teeth, lips or other oral mucosa - nerve damage due to positioning  - sore throat or hoarseness - Damage to heart, brain, nerves, lungs, other parts of body or loss of life  Patient voiced understanding.)        Anesthesia Quick Evaluation

## 2021-12-27 NOTE — Transfer of Care (Signed)
Immediate Anesthesia Transfer of Care Note  Patient: [name].  Procedure(s) Performed: ECT TX  Patient Location: PACU  Anesthesia Type:General  Level of Consciousness: awake  Airway & Oxygen Therapy: Patient Spontanous Breathing and Patient connected to face mask oxygen  Post-op Assessment: Report given to RN and Post -op Vital signs reviewed and stable  Post vital signs: Reviewed and stable  Last Vitals:  Vitals Value Taken Time  BP 138/91 12/27/21 1230  Temp    Pulse 89 12/27/21 1231  Resp 18 12/27/21 1233  SpO2 100 % 12/27/21 1231  Vitals shown include unvalidated device data.  Last Pain:  Vitals:   12/27/21 1131  TempSrc:   PainSc: 0-No pain      Patients Stated Pain Goal: 0 (00/93/81 8299)  Complications: No notable events documented.

## 2021-12-27 NOTE — Consult Note (Addendum)
[facility] Nurse Consult Note: Reason for Consult: Consult requested for right heel.  Pt was noted to have a Deep tissue pressure injury to this location during a previous admission, according to the wound care flow sheet.  This has currently evolved into a Stage 2 pressure injury; 5X5cm clear fluid filled intact blister. No open wound or drainage, darker-colored loose skin at edges.  Pressure Injury POA: Yes Dressing procedure/placement/frequency: Topical treatment orders provided for bedside nurses to perform as follows to protect from further injury and promote healing: Float heels to reduce pressure. Foam dressing to right heel, change Q 3 days or PRN soiling. Please re-consult if further assistance is needed.  Thank-you,  Julien Girt MSN, [place], [place], [place], [place]

## 2021-12-27 NOTE — Plan of Care (Signed)

## 2021-12-27 NOTE — Procedures (Signed)
ECT SERVICES Physician's Interval Evaluation & Treatment Note  Patient Identification: [name]. MRN:  [id number] Date of Evaluation:  12/27/2021 TX #: 2  MADRS:   MMSE:   P.E. Findings:  No change to physical exam.  He is much more awake and interactive  Psychiatric Interval Note:  Awake alert not expressing any suicidal ideation.  More appropriate affect of tone  Subjective:  Patient is a 56 y.o. male seen for evaluation for Electroconvulsive Therapy. No complaint  Treatment Summary:   '[]'$   Right Unilateral             '[x]'$  Bilateral   % Energy : 1.0 ms 50%   Impedance: 2290 ohms  Seizure Energy Index: 18,396 V squared  Postictal Suppression Index: 76%  Seizure Concordance Index: 96%  Medications  Pre Shock: Brevital 80 mg succinylcholine 100 mg  Post Shock: None  Seizure Duration: 17 seconds EMG 50 seconds EEG   Comments: Seems to be significantly better.  We have him on the schedule for Monday but we can reassess at that point to see if he needs it.  Spoke with his sister.  After reviewing his medicines it looks like what he was taking before he came into the hospital was somewhat confusing and different than his prior baseline.  Because of her concern that he is still having psychotic symptoms I will increase his Abilify.  Lungs:  '[x]'$   Clear to auscultation               '[]'$  Other:   Heart:    '[x]'$   Regular rhythm             '[]'$  irregular rhythm    '[x]'$   Previous H&P reviewed, patient examined and there are NO CHANGES                 '[]'$   Previous H&P reviewed, patient examined and there are changes noted.   Alethia Berthold, MD 9/8/20234:07 PM

## 2021-12-28 DIAGNOSIS — G928 Other toxic encephalopathy: Secondary | ICD-10-CM | POA: Diagnosis not present

## 2021-12-28 DIAGNOSIS — N189 Chronic kidney disease, unspecified: Secondary | ICD-10-CM | POA: Diagnosis not present

## 2021-12-28 DIAGNOSIS — N179 Acute kidney failure, unspecified: Secondary | ICD-10-CM | POA: Diagnosis not present

## 2021-12-28 DIAGNOSIS — J690 Pneumonitis due to inhalation of food and vomit: Secondary | ICD-10-CM | POA: Diagnosis not present

## 2021-12-28 LAB — GLUCOSE, CAPILLARY
Glucose-Capillary: 133 mg/dL — ABNORMAL HIGH (ref 70–99)
Glucose-Capillary: 99 mg/dL (ref 70–99)
Glucose-Capillary: 109 mg/dL — ABNORMAL HIGH (ref 70–99)
Glucose-Capillary: 80 mg/dL (ref 70–99)

## 2021-12-28 NOTE — Progress Notes (Signed)
  Progress Note   Patient: [name]. DJM:[id number] DOB: 11-24-1965 DOA: 12/20/2021     8 DOS: the patient was seen and examined on 12/28/2021   Brief hospital course: 56 year old man with ADD, bipolar disorder, dementia, type 2 diabetes mellitus, CKD stage II, hypertension, neuropathy, schizoaffective disorder.  Patient was transferred from [facility] over to [facility] [place] [facility] to obtain ECT treatments.  Psychiatry will try to start ECT treatments on Wednesday or Friday.  Patient initially had [facility] for encephalopathy, DKA, acute kidney injury and rhabdomyolysis.  Patient was found to have [facility] spotted fever and aspiration pneumonia.  Patient completed doxycycline for [facility] spotted fever and IV Zosyn for aspiration pneumonia. ECT started on 12/25/2021.  Assessment and Plan: * Toxic metabolic encephalopathy Dementia without behavioral disturbance ([place]) Patient has not made a significant improvement after ECT was started.  Discussed with Dr. Weber Cooks, patient may be ready for discharge early next week.  Planning another session on Monday.   Aspiration pneumonia ([place]) RMSF [facility] spotted fever) Completed antibiotics, discussed with speech therapy, given patient frequent ECTs, would not consider advance diet yet.   Generalized weakness Discussed with PT, continue inpatient physical therapy.  We will decide if patient can go home versus a psych unit early next week.  Otherwise, have to looking for nursing home placement.   Hyponatremia Improved.   Thyroid nodule Outpatient follow-up with PCP.   Type 2 diabetes mellitus with hyperlipidemia (HCC) Last hemoglobin A1c elevated at 8.0.  Glucose at goal.  Acute kidney injury superimposed on CKD 3A(HCC) Anemia of chronic disease Renal function improved.   Multiple skin pressure ulcers POA. Stage II at the medial sacrum. Stage II right hip. Stage II right elbow. Deep tissue injury right  heel. Patient has been followed by wound care.        Subjective:  Patient feels better today, appetite has improved.  No shortness of breath.  Physical Exam: Vitals:   12/27/21 1630 12/27/21 2121 12/28/21 0451 12/28/21 0810  BP: 102/75 111/67 124/76 114/79  Pulse: 90 87 87 84  Resp: '16 18 17 18  '$ Temp: 98.2 F (36.8 C) 98.3 F (36.8 C) 98.6 F (37 C) 98.4 F (36.9 C)  TempSrc:      SpO2: 99% 96% 98% 99%  Weight:      Height:       General exam: Appears calm and comfortable  Respiratory system: Clear to auscultation. Respiratory effort normal. Cardiovascular system: S1 & S2 heard, RRR. No JVD, murmurs, rubs, gallops or clicks. No pedal edema. Gastrointestinal system: Abdomen is nondistended, soft and nontender. No organomegaly or masses felt. Normal bowel sounds heard. Central nervous system: Alert and oriented. No focal neurological deficits. Extremities: Symmetric 5 x 5 power. Skin: No rashes, lesions or ulcers Psychiatry: Judgement and insight appear normal. Mood & affect appropriate.   Data Reviewed:  There are no new results to review at this time.  Family Communication:   Disposition: Status is: Inpatient Remains inpatient appropriate because: Severity of disease, inpatient procedure.  Planned Discharge Destination:  TBD    Time spent: 28 minutes  Author: Sharen Hones, MD 12/28/2021 11:52 AM  For on call review [email or web address].

## 2021-12-28 NOTE — Discharge Summary (Addendum)
Name: [name]. MRN: [id number] DOB: November 03, 1965 56 y.o. PCP: [name], MD  Date of Admission: 12/20/2021 11:26 AM Date of Discharge: 12/20/2021 Attending Physician: Sharen Hones, MD  Discharge Diagnosis: 1. Principal Problem:   Toxic metabolic encephalopathy Active Problems:   Anemia of chronic disease   Dyslipidemia   HLD (hyperlipidemia)   GERD (gastroesophageal reflux disease)   Chronic kidney disease   Dementia without behavioral disturbance (HCC)   BPH (benign prostatic hyperplasia)   Acute kidney injury superimposed on CKD ([place])   Pressure injury of skin   Type 2 diabetes mellitus with hyperlipidemia (HCC)   Thyroid nodule   Aspiration pneumonia ([place])   RMSF [facility] spotted fever)   Hyponatremia   Generalized weakness   Discharge Medications: - Ativan 2 mg TID - Abilify 5 mg daily - IV Zosyn 3.375 g (day 6/7) - Sliding Scale Insulin - Semglee 35 units daily - Novolog 9 units q4 hours  Disposition and follow-up:   [name]. was discharged from [facility] in Stable condition.  At the hospital follow up visit please address:  1.    A. Toxic metabolic encephalopathy  B. Acute hypoxia - present on admission, resolved on discharge  C [place] spotted fever IgM +  D.  Hospital Acquired Left lower lung aspiration pneumonia  E. Acute kidney injury on CKD  F. Incidental Thyroid nodule  2.  Labs / imaging needed at time of follow-up: CBC, BMP  3.  Pending labs/ test needing follow-up: none  Follow-up Appointments: - Recommend patient follows up with his primary care provider Dr. [name] within 1-2 weeks following discharge from [place].   Hospital Course by problem list: [name] is a 56 y/o patient with past medical history of PTSD, bipolar disorder, schizoaffective disorder, medication-induced tremors, and polypharmacy that presented with confusion and was admitted for acute  encephalopathy likely secondary to polypharmacy and catatonia.     # Toxic metabolic encephalopathy # Hyperthermia # Acute hypoxic resolved on discharge Patient was initially found down in his home covered in urine and feces during a welfare check.  Patient was alert and oriented x0 on admission and somnolent.  He was making attempts to verbalize but was mostly grunting. Opened eyes to verbal stimuli. GCS 10 on admission.  Moving all extremities spontaneously but not following commands. Suspected secondary to polypharmacy vs catatonia vs metabolic. Ammonia 44 on admission. UDS positive for benzodiazepines. Valproic acid level was low. TSH normal. Initially held gabapentin, Abilify, trazodone, Wellbutrin, and BuSpar due to concern for polypharmacy. EEG demonstrated moderate diffuse encephalopathy with no seizure activity.  Patient was intermittently spiking fevers. Blood cultures from 8/13 grew Staph haemolyticus and staph epidermidis. Initially started on IV vancomycin then stopped shortly after following clinical improvement. HSV and VZV CSF studies were negative. Bartonella negative. HIV and RPR negative.  Patients mentation improved slightly throughout his hospital stay.  Near the time he was transferred to [facility] he was able to speak in short sentences although still quite confused and only oriented to self.  Psych recommended that the patient be transferred to [facility] for ECT give that we did not have the capabilities here at [facility]. Due to long wait for transfer to [place] for ECT, psych was initially comfortable sending patient to SNF. Psych was planning to slowly taper off ativan to prevent rebound catatonia. OT recommended OT once patient is eventually sent to SNF. Transport services to [facility] arrived on the morning  of 9/1 and patient was transferred for psych reassessment and assessment for possibility of ECT. Psych was comfortable with patient being reassessed at [facility] given option for  ECT there. Psych recommends continuing Ativan 2 mg TID and Abilify 5 mg daily.   2. # [place] spotted fever IgM + Initially presented w/ fever, petechial rash of distal extremities, and confusion. Rash continued to resolve and was not present on the day of transfer. Suspected that this was contributing to his encephalopathy. Completed 5 day course of doxycycline on 8/30. WBC count normal on the day of transfer.    3. # Hospital Acquired Left lower lung aspiration pneumonia # Hypoxic respiratory failure Coarse breath sounds noted early in hospital stay. Opacity present CXR on 8/27. Covid and RVP negative. MRSA swab negative. Repeat BC negative remained negative. Aspiration was likely secondary to altered mentation. SpO2 remained > 95% on room air on day of transfer. Continued to have difficulty auscultating lung sounds due to transmitted lung sounds. Patient was on IV Zosyn 3.375 g day 6/7 on day of transfer.   4. # Anion gap metabolic acidosis  # H/o [id number] # Malnutrition # Enteral nutrition # Rhabdomyolysis AG of 20 on admission w/ BHBA > 8. UA positive for ketones. AG metabolic acidosis was initially suspected to be DKA given his ketosis and elevated blood glucoses. Calculated delta-delta was -1 so there may be an element of concomitant normal gap metabolic acidosis. Presented with rhabdo and elevated CK (1003) and was thought to be also contributing. Patient treated with insulin via endotool and IVF. Anion gap closed and lactate normalized. Beta-hydroxybutyrate normalized. CK normalized and Rhabdo resolved. Was on lantus 45-50u daily and humalog 10-14u TID w/ meals at home. Treated with Semglee 35 units with SSI and NovoLog 9 units q4h. Due to patients altered mentation, core track was placed. Patient pulled out core track on 8/29. Patient was placed on dysphagia 2 diet following results of DG swallow and recommendations from speech pathology. Morning glucose in the low 100s on the day of  transfer.   5. # Acute kidney injury on CKD Baseline creatinine approximately 2. Creatinine elevated on admission. Suspected prerenal. IVF were given. Cr improved to 1.37 on day of transfer. BUN improved to 21 on day of transfer.     6. # Normocytic anemia Hgb remained stable throughout his stay in the hospital. Suspected to be from hemodilution from tube feedings. Hgb stable at 11.5 on day of transfer.    7. # Pressure injuries Pressure injury to right elbow, right lower extremity, and right sacrum noted on admission. Injuries remained stable throughout his hospital stay. Continued rolling and wound management.   8. # Thyroid nodule 1.5 cm incidental thyroid nodule on CT. Recommend outpatient thyroid US.     9. # Hyponatremia Became hyponatremic during his hospitalization. Suspected secondary to free water flushes. Hyponatremia resolved following decreased frequency of free water flushes. Sodium normal on day of transfer.   10. # Dispo Sister [name] is health care power of attorney. Frequently updated on patients condition. Visited several times throughout his stay.     Discharge Exam:   BP 120/79 (BP Location: Right Arm)   Pulse 83   Temp 98.1 F (36.7 C)   Resp 16   Ht '6\' 3"'$  (1.905 m)   Wt 79.8 kg   SpO2 97%   BMI 21.99 kg/m   Constitutional: chronically ill-appearing, confused. Eyes: conjunctiva non-erythematous.   Cardiovascular: regular rate and rhythm. No murmurs, rubs,  or gallops.  Pulmonary: normal work of breathing on RA. No wheezes, rales, or rhonchi. Transmitted lung sounds present in all lung fields.  Abdominal: soft, non-distended. MSK: no LE swelling.  Neurological: alert & oriented to self only, only able to speak a few words but responding appropriately to some questions.  Skin: warm and dry. Well-healed abrasions of lower extremities. No pressure wounds noted. Psych: flat affect  Pertinent Labs, Studies, and Procedures:     Latest Ref Rng & Units 12/24/2021     6:45 AM 12/23/2021    5:46 AM 12/22/2021    4:36 AM  CBC  WBC 4.0 - 10.5 K/uL 4.6  8.1  7.0   Hemoglobin 13.0 - 17.0 g/dL 12.7  12.4  13.0   Hematocrit 39.0 - 52.0 % 38.3  36.4  38.4   Platelets 150 - 400 K/uL 218  244  259        Latest Ref Rng & Units 12/24/2021    6:45 AM 12/23/2021    5:46 AM 12/22/2021    4:36 AM  BMP  Glucose 70 - 99 mg/dL 163  178  159   BUN 6 - 20 mg/dL '22  25  19   '$ Creatinine 0.61 - 1.24 mg/dL 1.27  1.30  1.39   Sodium 135 - 145 mmol/L 137  134  140   Potassium 3.5 - 5.1 mmol/L 3.6  3.8  5.0   Chloride 98 - 111 mmol/L 104  105  110   CO2 22 - 32 mmol/L '25  21  25   '$ Calcium 8.9 - 10.3 mg/dL 9.7  9.3  10.2     DG Hip  8/13 IMPRESSION: No fracture or dislocation is seen pelvis and right hip. Previous arthroplasty in both hips.  CT Head w/o contrast 8/13 IMPRESSION: CT of the head: Chronic atrophic changes without acute abnormality.   CT of cervical spine: Multilevel degenerative changes of the cervical spine are seen without acute bony abnormality.   1.5 cm incidental thyroid nodule. Recommend nonemergent thyroid US.  CT cervical spine 8/13 IMPRESSION: CT of the head: Chronic atrophic changes without acute abnormality.   CT of cervical spine: Multilevel degenerative changes of the cervical spine are seen without acute bony abnormality.   1.5 cm incidental thyroid nodule. Recommend nonemergent thyroid US.  DG foot 8/13 IMPRESSION: Chronic changes without acute abnormality.  MRI Brain w/o contrast 8/13 IMPRESSION: 1. Technically limited exam due to the patient's inability to tolerate the exam. Diffusion-weighted sequences only were performed. 2. No acute intracranial infarct. No other obvious intracranial abnormality on this limited exam.  DG chest 8/15 IMPRESSION: There are no signs of pulmonary edema or focal pulmonary consolidation.  CT Head w/o contrast 8/16 IMPRESSION: Mild atrophy.  No acute abnormality no change from the  recent study  DG Chest 8/20 IMPRESSION: 1.  Hypoinflation with minimal linear atelectasis left base.   2.  Enteric tube as described.  CT Head w/o contrast 8/21 IMPRESSION: No evidence of acute intracranial abnormality.   Mild-to-moderate generalized cerebral atrophy, advanced for age.   Nonspecific 14 mm cutaneous/subcutaneous cystic appearing lesion within the left upper posterior neck. Direct visualization recommended.  DG FL guided Lumbar Puncture 8/21 IMPRESSION: Technically successful fluoroscopically-guided L3-L4 lumbar puncture.   Opening pressure: 14 cm water.   12 mL of CSF obtained for laboratory studies.   No immediate post-procedure complication.  DG Chest 8/23 IMPRESSION: Minor atelectasis at the left lung base, stable. Feeding tube appears stable.  DG Chest  8/27 IMPRESSION: Increased opacity in the left lower lung field consistent with pneumonia or aspiration. Otherwise stable hypoinflated exam.    Discharge Instructions:  You were hospitalized for altered mental status.  - Recommend patient follows up with his primary care provider Dr. [name] within 1-2 weeks following discharge from [place].     Signed: [name], MD 12/28/2021, 7:31 PM   Pager: [phone]

## 2021-12-28 NOTE — Plan of Care (Signed)

## 2021-12-29 DIAGNOSIS — N189 Chronic kidney disease, unspecified: Secondary | ICD-10-CM | POA: Diagnosis not present

## 2021-12-29 DIAGNOSIS — G928 Other toxic encephalopathy: Secondary | ICD-10-CM | POA: Diagnosis not present

## 2021-12-29 DIAGNOSIS — N179 Acute kidney failure, unspecified: Secondary | ICD-10-CM | POA: Diagnosis not present

## 2021-12-29 DIAGNOSIS — J690 Pneumonitis due to inhalation of food and vomit: Secondary | ICD-10-CM | POA: Diagnosis not present

## 2021-12-29 LAB — GLUCOSE, CAPILLARY
Glucose-Capillary: 148 mg/dL — ABNORMAL HIGH (ref 70–99)
Glucose-Capillary: 127 mg/dL — ABNORMAL HIGH (ref 70–99)
Glucose-Capillary: 107 mg/dL — ABNORMAL HIGH (ref 70–99)
Glucose-Capillary: 159 mg/dL — ABNORMAL HIGH (ref 70–99)

## 2021-12-29 MED ORDER — POLYVINYL ALCOHOL 1.4 % OP SOLN
1.0000 [drp] | OPHTHALMIC | Status: DC | PRN
  Administered 2021-12-30 – 2022-01-02 (×2): 1 [drp] via OPHTHALMIC
  Filled 2021-12-29: qty 15

## 2021-12-29 NOTE — Plan of Care (Signed)

## 2021-12-29 NOTE — Progress Notes (Signed)
  Progress Note   Patient: [name]. LKJ:[id number] DOB: 11/18/1965 DOA: 12/20/2021     9 DOS: the patient was seen and examined on 12/29/2021   Brief hospital course: 56 year old man with ADD, bipolar disorder, dementia, type 2 diabetes mellitus, CKD stage II, hypertension, neuropathy, schizoaffective disorder.  Patient was transferred from [facility] over to [facility] to obtain ECT treatments.  Psychiatry will try to start ECT treatments on Wednesday or Friday.  Patient initially had [facility] for encephalopathy, DKA, acute kidney injury and rhabdomyolysis.  Patient was found to have [facility] spotted fever and aspiration pneumonia.  Patient completed doxycycline for [facility] spotted fever and IV Zosyn for aspiration pneumonia. ECT started on 12/25/2021.  Assessment and Plan: * Toxic metabolic encephalopathy Dementia without behavioral disturbance [facility]) Patient progressed well, psychiatry is planning for another session of ETC on Monday.   Aspiration pneumonia ([place]) [facility] spotted fever) Completed antibiotics, discussed with speech therapy, given patient frequent ECTs, would not consider advance diet yet.   Generalized weakness Patient is doing much better, was able to walk with minimal assist.  Patient will be evaluated again by PT/OT, hopefully patient no longer need nursing placement.   Hyponatremia Improved.   Thyroid nodule Outpatient follow-up with PCP.   Type 2 diabetes mellitus with hyperlipidemia (HCC) Last hemoglobin A1c elevated at 8.0.  Glucose at goal.   Acute kidney injury superimposed on CKD 3A(HCC) Anemia of chronic disease Renal function improved.   Multiple skin pressure ulcers POA. Stage II at the medial sacrum. Stage II right hip. Stage II right elbow. Deep tissue injury right heel. Patient has been followed by wound care.          Subjective:  Patient doing much better, able to walk with minimal  assist.  Good appetite.  Physical Exam: Vitals:   12/28/21 2027 12/29/21 0339 12/29/21 0500 12/29/21 0849  BP: 120/74 125/68  117/81  Pulse: 87 85  85  Resp: '16 17  16  '$ Temp: 98.6 F (37 C) 98.4 F (36.9 C)  97.7 F (36.5 C)  TempSrc:  Oral    SpO2: 96% 97%  98%  Weight:   80.1 kg   Height:       General exam: Appears calm and comfortable  Respiratory system: Clear to auscultation. Respiratory effort normal. Cardiovascular system: S1 & S2 heard, RRR. No JVD, murmurs, rubs, gallops or clicks. No pedal edema. Gastrointestinal system: Abdomen is nondistended, soft and nontender. No organomegaly or masses felt. Normal bowel sounds heard. Central nervous system: Alert and oriented. No focal neurological deficits. Extremities: Symmetric 5 x 5 power. Skin: No rashes, lesions or ulcers Psychiatry: Judgement and insight appear normal. Mood & affect appropriate.   Data Reviewed:  There are no new results to review at this time.  Family Communication:   Disposition: Status is: Inpatient Remains inpatient appropriate because: Severity of disease, inpatient procedures.  Planned Discharge Destination:  TBD    Time spent: 25 minutes  Author: Sharen Hones, MD 12/29/2021 12:48 PM  For on call review [email or web address].

## 2021-12-29 NOTE — TOC Progression Note (Signed)
Transition of Care (TOC) - Progression Note    Patient Details  Name: [name]. MRN: [id number] Date of Birth: 05/05/1965  Transition of Care [facility]) CM/SW Contact  Valente David, RN Phone Number: 12/29/2021, 4:48 PM  Clinical Narrative:     Spoke with patient at bedside and sister on phone.  Aware of recommendations for SNF for rehab.  List of agencies emailed to sister, she will contact [facility] team with preference.  Noted per MD that patient is doing much better and will have another PT/OT evaluation.  Will initiate FL2 and bed search pending follow up evaluation.         Expected Discharge Plan and Services                                                 Social Determinants of Health (SDOH) Interventions    Readmission Risk Interventions    01/02/2020    4:09 PM  Readmission Risk Prevention Plan  Transportation Screening Complete  PCP or Specialist Appt within 3-5 Days Complete  HRI or [place] Complete  Social Work Consult for [place] Planning/Counseling Complete  Palliative Care Screening Not Applicable  Medication Review Press photographer) Referral to Pharmacy

## 2021-12-30 ENCOUNTER — Encounter: Payer: Self-pay | Admitting: Internal Medicine

## 2021-12-30 ENCOUNTER — Inpatient Hospital Stay: Payer: Medicare Other

## 2021-12-30 ENCOUNTER — Inpatient Hospital Stay: Payer: Medicare Other | Admitting: Certified Registered Nurse Anesthetist

## 2021-12-30 DIAGNOSIS — N179 Acute kidney failure, unspecified: Secondary | ICD-10-CM | POA: Diagnosis not present

## 2021-12-30 DIAGNOSIS — N189 Chronic kidney disease, unspecified: Secondary | ICD-10-CM | POA: Diagnosis not present

## 2021-12-30 DIAGNOSIS — G928 Other toxic encephalopathy: Secondary | ICD-10-CM | POA: Diagnosis not present

## 2021-12-30 DIAGNOSIS — J690 Pneumonitis due to inhalation of food and vomit: Secondary | ICD-10-CM | POA: Diagnosis not present

## 2021-12-30 LAB — GLUCOSE, CAPILLARY
Glucose-Capillary: 147 mg/dL — ABNORMAL HIGH (ref 70–99)
Glucose-Capillary: 137 mg/dL — ABNORMAL HIGH (ref 70–99)
Glucose-Capillary: 175 mg/dL — ABNORMAL HIGH (ref 70–99)
Glucose-Capillary: 189 mg/dL — ABNORMAL HIGH (ref 70–99)

## 2021-12-30 MED ORDER — METHOHEXITAL SODIUM 100 MG/10ML IV SOSY
PREFILLED_SYRINGE | INTRAVENOUS | Status: DC | PRN
  Administered 2021-12-30: 80 mg via INTRAVENOUS

## 2021-12-30 MED ORDER — LABETALOL HCL 5 MG/ML IV SOLN
INTRAVENOUS | Status: AC
  Filled 2021-12-30: qty 4

## 2021-12-30 MED ORDER — SUCCINYLCHOLINE CHLORIDE 200 MG/10ML IV SOSY
PREFILLED_SYRINGE | INTRAVENOUS | Status: DC | PRN
  Administered 2021-12-30: 100 mg via INTRAVENOUS

## 2021-12-30 MED ORDER — METHOHEXITAL SODIUM 0.5 G IJ SOLR
INTRAMUSCULAR | Status: AC
  Filled 2021-12-30: qty 500

## 2021-12-30 MED ORDER — LABETALOL HCL 5 MG/ML IV SOLN
INTRAVENOUS | Status: DC | PRN
  Administered 2021-12-30: 10 mg via INTRAVENOUS

## 2021-12-30 MED ORDER — SODIUM CHLORIDE 0.9 % IV SOLN: 500.0000 mL | Freq: Once | INTRAVENOUS | Status: DC

## 2021-12-30 MED ORDER — ONDANSETRON HCL 4 MG/2ML IJ SOLN
4.0000 mg | Freq: Four times a day (QID) | INTRAMUSCULAR | Status: DC | PRN
  Administered 2021-12-30: 4 mg via INTRAVENOUS
  Filled 2021-12-30: qty 2

## 2021-12-30 MED ORDER — SODIUM CHLORIDE 0.9 % IV SOLN: INTRAVENOUS | Status: DC | PRN

## 2021-12-30 NOTE — H&P (Signed)
[name]. is an 56 y.o. male.   Chief Complaint: feling much better HPI: schizophrenia  Past Medical History:  Diagnosis Date   ADD (attention deficit disorder)    Anxiety    Arthritis    right hip   Bipolar 1 disorder ([place])    Blood in urine    CKD (chronic kidney disease), stage III ([place])    Creatinine elevation    Dementia ([place])    "early onset" (08/04/2017)   Depression    bipolar [facility]   Diabetes mellitus without complication ([place])    Family history of anesthesia complication    pt is unsure , but pt father may have been difficult to arouse    HCAP (healthcare-associated pneumonia) 10/31/2012   History of kidney stones    Hypertension    Hypogonadism male    Liver fatty degeneration    Microscopic hematuria    hereditary s/p Urology eval   Neuromuscular disorder ([place])    feet neuropathy    Osteoarthritis of right hip 11/28/2011   2012 2015 s/p THR Severe  Dr Novella Olive     Pleural effusion 11/02/2012   Pneumonia 10-2012   Pneumonia, organism unspecified([place]) 11/02/2012   Polysubstance dependence, non-opioid, in remission ([place])    remote   Primary osteoarthritis of left hip 05/22/2015   PTSD (post-traumatic stress disorder)    SOCIAL ANXIETY DISORDER    Schizoaffective disorder ([place])    Substance abuse ([place])    Suicide attempt by multiple drug overdose ([place]) 2016-01-06   Grieving his cat's death 07/14/2015    Past Surgical History:  Procedure Laterality Date   BACK SURGERY     CLOSED REDUCTION METACARPAL WITH PERCUTANEOUS PINNING Right    LUMBAR [place] HIP ARTHROPLASTY Right 08/16/2013   Procedure: TOTAL HIP ARTHROPLASTY ANTERIOR APPROACH;  Surgeon: Hessie Dibble, MD;  Location: [place];  Service: Orthopedics;  Laterality: Right;   TOTAL HIP ARTHROPLASTY Left 05/22/2015   Procedure: TOTAL HIP ARTHROPLASTY ANTERIOR APPROACH;  Surgeon: Melrose Nakayama, MD;  Location: [place];  Service: Orthopedics;  Laterality: Left;     Family History  Problem Relation Age of Onset   Diabetes Father    Cancer Mother        died of melanoma with mets   Cervical cancer Sister    Diabetes Sister    Other Neg Hx        hypogonadism   Colon cancer Neg Hx    Colon polyps Neg Hx    Esophageal cancer Neg Hx    Rectal cancer Neg Hx    Stomach cancer Neg Hx    Social History:  reports that he quit smoking about 2 years ago. His smoking use included cigarettes. He has never used smokeless tobacco. He reports that he does not currently use alcohol. He reports that he does not use drugs.  Allergies:  Allergies  Allergen Reactions   Vicodin [Hydrocodone-Acetaminophen] Itching    Medications Prior to Admission  Medication Sig Dispense Refill   ARIPiprazole (ABILIFY) 5 MG tablet Take 1 tablet (5 mg total) by mouth daily.     Continuous Blood Gluc Receiver (FREESTYLE LIBRE READER) DEVI Use as directed 1 each each   Continuous Blood Gluc Sensor (FREESTYLE LIBRE 14 DAY SENSOR) MISC Use as directed 2 each 4   Continuous Blood Gluc Sensor (FREESTYLE LIBRE 2 SENSOR) MISC by Does not apply route.     fenofibrate (TRICOR)  145 MG tablet TAKE 1 TABLET BY MOUTH DAILY (Patient taking differently: Take 145 mg by mouth daily.) 90 tablet 3   glucose blood (ACCU-CHEK GUIDE) test strip CHECK SUGAR FOUR TIMES DAILY 100 each 12   insulin glargine-yfgn (SEMGLEE) 100 UNIT/ML injection Inject 0.35 mLs (35 Units total) into the skin daily. 10 mL 11   Insulin Pen Needle (B-D UF III MINI PEN NEEDLES) 31G X 5 MM MISC USE 4 TIMES DAILY 120 each 12   LORazepam (ATIVAN) 2 MG/ML injection Inject 1 mL (2 mg total) into the vein 3 (three) times daily. 1 mL 0   naloxone (NARCAN) nasal spray 4 mg/0.1 mL Place 1 spray into the nose daily as needed (For overdose).     Nutritional Supplements (FEEDING SUPPLEMENT, NEPRO CARB STEADY,) LIQD Take 237 mLs by mouth 2 (two) times daily between meals.  0   [EXPIRED] piperacillin-tazobactam (ZOSYN) 3.375 GM/50ML IVPB  Inject 50 mLs (3.375 g total) into the vein every 8 (eight) hours for 1 day. 50 mL     Results for orders placed or performed during the hospital encounter of 12/20/21 (from the past 48 hour(s))  Glucose, capillary     Status: None   Collection Time: 12/28/21 12:02 PM  Result Value Ref Range   Glucose-Capillary 99 70 - 99 mg/dL    Comment: Glucose reference range applies only to samples taken after fasting for at least 8 hours.  Glucose, capillary     Status: Abnormal   Collection Time: 12/28/21  4:56 PM  Result Value Ref Range   Glucose-Capillary 109 (H) 70 - 99 mg/dL    Comment: Glucose reference range applies only to samples taken after fasting for at least 8 hours.  Glucose, capillary     Status: None   Collection Time: 12/28/21  8:29 PM  Result Value Ref Range   Glucose-Capillary 80 70 - 99 mg/dL    Comment: Glucose reference range applies only to samples taken after fasting for at least 8 hours.  Glucose, capillary     Status: Abnormal   Collection Time: 12/29/21  8:45 AM  Result Value Ref Range   Glucose-Capillary 148 (H) 70 - 99 mg/dL    Comment: Glucose reference range applies only to samples taken after fasting for at least 8 hours.  Glucose, capillary     Status: Abnormal   Collection Time: 12/29/21 12:15 PM  Result Value Ref Range   Glucose-Capillary 127 (H) 70 - 99 mg/dL    Comment: Glucose reference range applies only to samples taken after fasting for at least 8 hours.  Glucose, capillary     Status: Abnormal   Collection Time: 12/29/21  5:23 PM  Result Value Ref Range   Glucose-Capillary 107 (H) 70 - 99 mg/dL    Comment: Glucose reference range applies only to samples taken after fasting for at least 8 hours.  Glucose, capillary     Status: Abnormal   Collection Time: 12/29/21  8:42 PM  Result Value Ref Range   Glucose-Capillary 159 (H) 70 - 99 mg/dL    Comment: Glucose reference range applies only to samples taken after fasting for at least 8 hours.  Glucose,  capillary     Status: Abnormal   Collection Time: 12/30/21  8:22 AM  Result Value Ref Range   Glucose-Capillary 147 (H) 70 - 99 mg/dL    Comment: Glucose reference range applies only to samples taken after fasting for at least 8 hours.   No results found.  Review of Systems  Constitutional: Negative.   HENT: Negative.    Eyes: Negative.   Respiratory: Negative.    Cardiovascular: Negative.   Gastrointestinal: Negative.   Musculoskeletal: Negative.   Skin: Negative.   Neurological: Negative.   Psychiatric/Behavioral: Negative.      Blood pressure 119/76, pulse 86, temperature 98.3 F (36.8 C), temperature source Oral, resp. rate 18, height '6\' 3"'$  (1.905 m), weight 81.9 kg, SpO2 98 %. Physical Exam Vitals and nursing note reviewed.  Constitutional:      Appearance: He is well-developed.  HENT:     Head: Normocephalic and atraumatic.  Eyes:     Conjunctiva/sclera: Conjunctivae normal.     Pupils: Pupils are equal, round, and reactive to light.  Cardiovascular:     Heart sounds: Normal heart sounds.  Pulmonary:     Effort: Pulmonary effort is normal.  Abdominal:     Palpations: Abdomen is soft.  Musculoskeletal:        General: Normal range of motion.     Cervical back: Normal range of motion.  Skin:    General: Skin is warm and dry.  Neurological:     General: No focal deficit present.     Mental Status: He is alert.  Psychiatric:        Mood and Affect: Mood normal.      Assessment/Plan Last ECT in index series   Alethia Berthold, MD 12/30/2021, 12:01 PM

## 2021-12-30 NOTE — Transfer of Care (Signed)
Immediate Anesthesia Transfer of Care Note  Patient: [name].  Procedure(s) Performed: ECT TX  Patient Location: PACU  Anesthesia Type:General  Level of Consciousness: drowsy  Airway & Oxygen Therapy: Patient Spontanous Breathing and Patient connected to face mask oxygen  Post-op Assessment: Report given to RN and Post -op Vital signs reviewed and stable  Post vital signs: Reviewed and stable  Last Vitals:  Vitals Value Taken Time  BP 142/88   Temp    Pulse 90   Resp 19 12/30/21 1243  SpO2 100   Vitals shown include unvalidated device data.  Last Pain:  Vitals:   12/30/21 1110  TempSrc:   PainSc: 0-No pain      Patients Stated Pain Goal: 0 (48/62/82 4175)  Complications: No notable events documented.

## 2021-12-30 NOTE — Anesthesia Postprocedure Evaluation (Signed)
Anesthesia Post Note  Patient: [name].  Procedure(s) Performed: ECT TX  Patient location during evaluation: PACU Anesthesia Type: General Level of consciousness: awake and alert Pain management: pain level controlled Vital Signs Assessment: post-procedure vital signs reviewed and stable Respiratory status: spontaneous breathing, nonlabored ventilation and respiratory function stable Cardiovascular status: blood pressure returned to baseline and stable Postop Assessment: no apparent nausea or vomiting Anesthetic complications: no   No notable events documented.   Last Vitals:  Vitals:   12/30/21 1330 12/30/21 1408  BP: 103/67 110/76  Pulse: 77 82  Resp: 12 17  Temp: 37 C 36.4 C  SpO2: 95% 96%    Last Pain:  Vitals:   12/30/21 1330  TempSrc:   PainSc: 0-No pain                 [place] Ouch

## 2021-12-30 NOTE — Progress Notes (Signed)
Occupational Therapy Treatment Patient Details Name: [name]. MRN: [id number] DOB: [birth year] Today's Date: 12/30/2021   History of present illness Pt is a 56 year old male who was admitted to [facility] on 12/01/2021 for altered mental status. Pt with long history of chronic psychotic disorder as well as dyslipidemia, hyperlipidemia, GERD, chronic kidney disease, BPH and type 2 diabetes mellitus with stage 4 chronic kidney disease, with long term current use of insulin. He was brought to [facility] where initially noted to be encephalopathic, DKA, AKI and rhabdomyolysis.  Slowly with fluid and insulin DKA, AKI and rhabdomyolysis resolved.  He was later also diagnosed with [facility] spotted fever for which she received 5 days of doxycycline and hospital course complicated by aspiration pneumonia requiring IV Zosyn.  His mentation was thought secondary to due to underlying psych issue as his MRI and EEG was negative.  Rest of the metabolic work-up was also negative. He transfered to [facility] on 12/20/2021 for ECT. He had been receiving PT/OT at [facility] progressed mobility to getting on B LE (crouched posture) x 5 with scoot pivot transfer to chair.   OT comments  Upon entering session, pt resting in recliner and agreeable to OT. Pt demonstrated improved alertness this date and ability to engage in conversation with therapist. Pt continues to require intermittent cues for safety awareness. Pt completed functional mobility to the bathroom and toilet transfer with Min guard using RW. Pt requesting to lay back down, however, sheets were soiled. Therapist changed sheets then pt endorsed wanting to sit up in chair to talk with family member on the phone. Pt left with all needs in reach. Pt is making progress toward goal completion. D/C recommendation remains appropriate. OT will continue to follow acutely.      Recommendations for follow up therapy are one component of a  multi-disciplinary discharge planning process, led by the attending physician.  Recommendations may be updated based on patient status, additional functional criteria and insurance authorization.    Follow Up Recommendations  Skilled nursing-short term rehab (<3 hours/day)    Assistance Recommended at Discharge Frequent or constant Supervision/Assistance  Patient can return home with the following  Assistance with cooking/housework;Assistance with feeding;Direct supervision/assist for medications management;Direct supervision/assist for financial management;Assist for transportation;Help with stairs or ramp for entrance;A lot of help with bathing/dressing/bathroom;A lot of help with walking and/or transfers   Equipment Recommendations  Other (comment) (defer to next venue of care)    Recommendations for Other Services      Precautions / Restrictions Precautions Precautions: Fall Restrictions Weight Bearing Restrictions: No       Mobility Bed Mobility Overal bed mobility: Needs Assistance             General bed mobility comments: NT, received and left in recliner at start/end of session    Transfers Overall transfer level: Needs assistance Equipment used: Rolling walker (2 wheels) Transfers: Sit to/from Stand Sit to Stand: Min guard           General transfer comment: STS from recliner     Balance Overall balance assessment: Needs assistance Sitting-balance support: Single extremity supported, Feet supported Sitting balance-Leahy Scale: Good     Standing balance support: Bilateral upper extremity supported, Reliant on assistive device for balance Standing balance-Leahy Scale: Fair                             ADL either performed or  assessed with clinical judgement   ADL Overall ADL's : Needs assistance/impaired                         Toilet Transfer: Min guard;Rolling walker (2 wheels);Ambulation;BSC/3in1 Armed forces technical officer Details  (indicate cue type and reason): use of BSC frame over toilet         Functional mobility during ADLs: Min guard;Rolling walker (2 wheels);Cueing for safety      Extremity/Trunk Assessment Upper Extremity Assessment Upper Extremity Assessment: Generalized weakness   Lower Extremity Assessment Lower Extremity Assessment: Generalized weakness   Cervical / Trunk Assessment Cervical / Trunk Assessment: Normal    Vision Baseline Vision/History: 1 Wears glasses     Perception     Praxis      Cognition Arousal/Alertness: Awake/alert Behavior During Therapy: Flat affect Overall Cognitive Status: No family/caregiver present to determine baseline cognitive functioning                                 General Comments: Pt alert, cooperative. Repeatedly asking therapist when his diet can be changed (continues to be on nectar thick liquids).        Exercises      Shoulder Instructions       General Comments      Pertinent Vitals/ Pain       Pain Assessment Pain Assessment: No/denies pain  Home Living                                          Prior Functioning/Environment              Frequency  Min 2X/week        Progress Toward Goals  OT Goals(current goals can now be found in the care plan section)  Progress towards OT goals: Progressing toward goals  Acute Rehab OT Goals Patient Stated Goal: none stated OT Goal Formulation: With patient/family Time For Goal Achievement: 01/04/22 Potential to Achieve Goals: [place] Discharge plan remains appropriate;Frequency remains appropriate    Co-evaluation                 AM-PAC OT "6 Clicks" Daily Activity     Outcome Measure   Help from another person eating meals?: None Help from another person taking care of personal grooming?: A Little Help from another person toileting, which includes using toliet, bedpan, or urinal?: A Lot Help from another person bathing  (including washing, rinsing, drying)?: A Lot Help from another person to put on and taking off regular upper body clothing?: A Little Help from another person to put on and taking off regular lower body clothing?: A Lot 6 Click Score: 16    End of Session Equipment Utilized During Treatment: Gait belt;Rolling walker (2 wheels)  OT Visit Diagnosis: Unsteadiness on feet (R26.81);Other abnormalities of gait and mobility (R26.89);Muscle weakness (generalized) (M62.81);Low vision, both eyes (H54.2);Other symptoms and signs involving cognitive function   Activity Tolerance Patient tolerated treatment well   Patient Left in chair;with call bell/phone within reach;with chair alarm set   Nurse Communication Mobility status        Time: 3810-1751 OT Time Calculation (min): 20 min  Charges: OT General Charges $OT Visit: 1 Visit OT Treatments $Self Care/Home Management : 8-22 mins   Doneta Public MS, OTR/L  ascom [phone]  12/30/21, 5:58 PM

## 2021-12-30 NOTE — Procedures (Signed)
ECT SERVICES Physician's Interval Evaluation & Treatment Note  Patient Identification: [name]. MRN:  [id number] Date of Evaluation:  12/30/2021 TX #: [id number]  MADRS:   MMSE:   P.E. Findings:  No change to physical exam  Psychiatric Interval Note:  Much more bright smiling alert interactive denies any depression  Subjective:  Patient is a 56 y.o. male seen for evaluation for Electroconvulsive Therapy. Feeling better no specific complaints  Treatment Summary:   '[]'$   Right Unilateral             '[x]'$  Bilateral   % Energy : 1.0 ms 50%   Impedance: 1850 ohms  Seizure Energy Index: 16,182 V squared  Postictal Suppression Index: 80%  Seizure Concordance Index: 90%  Medications  Pre Shock: Brevital 80 mg succinylcholine 100 mg  Post Shock:    Seizure Duration: 20 seconds EMG 50 seconds EEG   Comments: Treatment today after which I think we probably have maxed out benefit for catatonia and he seems to be much improved and near baseline.  We can probably end the ECT course at this point  Lungs:  '[x]'$   Clear to auscultation               '[]'$  Other:   Heart:    '[x]'$   Regular rhythm             '[]'$  irregular rhythm    '[x]'$   Previous H&P reviewed, patient examined and there are NO CHANGES                 '[]'$   Previous H&P reviewed, patient examined and there are changes noted.   Alethia Berthold, MD 9/11/20233:59 PM

## 2021-12-30 NOTE — Anesthesia Preprocedure Evaluation (Addendum)
Anesthesia Evaluation  Patient identified by MRN, date of birth, ID band Patient awake    Reviewed: Allergy & Precautions, NPO status , Patient's Chart, lab work & pertinent test results  Airway Mallampati: III  TM Distance: >3 FB Neck ROM: full    Dental  (+) Edentulous Upper   Pulmonary pneumonia, resolved, former smoker,    Pulmonary exam normal        Cardiovascular hypertension, Normal cardiovascular exam     Neuro/Psych PSYCHIATRIC DISORDERS Anxiety Depression Bipolar Disorder Diabetic neuropathy   Neuromuscular disease    GI/Hepatic Neg liver ROS, GERD  ,  Endo/Other  diabetes, Well Controlled, Type 2, Insulin Dependent  Renal/GU Renal InsufficiencyRenal disease  negative genitourinary   Musculoskeletal   Abdominal Normal abdominal exam  (+)   Peds  Hematology  (+) Blood dyscrasia, anemia ,   Anesthesia Other Findings Note per chart review: 56 year old male who was admitted to [facility] on 12/01/2021 for altered mental status. Pt with long history of chronic psychotic disorder as well as dyslipidemia, hyperlipidemia, GERD, chronic kidney disease, BPH and type 2 diabetes mellitus with stage 4 chronic kidney disease, with long term current use of insulin. He was brought to [facility] where initially noted to be encephalopathic, DKA, AKI and rhabdomyolysis.  Slowly with fluid and insulin DKA, AKI and rhabdomyolysis resolved.  He was later also diagnosed with [facility] spotted fever for which she received 5 days of doxycycline and hospital course complicated by aspiration pneumonia requiring IV Zosyn.  His mentation was thought secondary to due to underlying psych issue as his MRI and EEG was negative.  Rest of the metabolic work-up was also negative. He transfered to [facility] on 12/20/2021 for ECT.   Past Medical History: No date: ADD (attention deficit disorder) No date: Anxiety No date: Arthritis      Comment:  right hip No date: Bipolar 1 disorder (HCC) No date: Blood in urine No date: CKD (chronic kidney disease), stage III (HCC) No date: Creatinine elevation No date: Dementia [facility])     Comment:  "early onset" (08/04/2017) No date: Depression     Comment:  bipolar [facility] No date: Diabetes mellitus without complication ([place]) No date: Family history of anesthesia complication     Comment:  pt is unsure , but pt father may have been difficult to               arouse  10/31/2012: HCAP (healthcare-associated pneumonia) No date: History of kidney stones No date: Hypertension No date: Hypogonadism male No date: Liver fatty degeneration No date: Microscopic hematuria     Comment:  hereditary s/p Urology eval No date: Neuromuscular disorder ([place])     Comment:  feet neuropathy  11/28/2011: Osteoarthritis of right hip     Comment:  2012 2015 s/p THR Severe  Dr Novella Olive   11/02/2012: Pleural effusion 10-2012: Pneumonia 11/02/2012: Pneumonia, organism unspecified(486) No date: Polysubstance dependence, non-opioid, in remission ([place])     Comment:  remote 05/22/2015: Primary osteoarthritis of left hip No date: PTSD (post-traumatic stress disorder)     Comment:  SOCIAL ANXIETY DISORDER  No date: Schizoaffective disorder ([place]) No date: Substance abuse ([place]) 12/26/2015: Suicide attempt by multiple drug overdose [facility])     Comment:  Grieving his cat's death 13-Jul-2015  Past Surgical History: No date: BACK SURGERY No date: CLOSED REDUCTION METACARPAL WITH PERCUTANEOUS PINNING; Right No date: LUMBAR DISC SURGERY No date: TONSILLECTOMY 08/16/2013: TOTAL HIP ARTHROPLASTY; Right  Comment:  Procedure: TOTAL HIP ARTHROPLASTY ANTERIOR APPROACH;                Surgeon: Hessie Dibble, MD;  Location: [place];                Service: Orthopedics;  Laterality: Right; 05/22/2015: TOTAL HIP ARTHROPLASTY; Left     Comment:  Procedure: TOTAL HIP ARTHROPLASTY ANTERIOR APPROACH;                Surgeon:  Melrose Nakayama, MD;  Location: [place];  Service:               Orthopedics;  Laterality: Left;  BMI    Body Mass Index: 22.57 kg/m      Reproductive/Obstetrics negative OB ROS                            Anesthesia Physical Anesthesia Plan  ASA: 2  Anesthesia Plan: General   Post-op Pain Management: Minimal or no pain anticipated   Induction: Intravenous  PONV Risk Score and Plan: Propofol infusion and TIVA  Airway Management Planned: Natural Airway and Mask  Additional Equipment:   Intra-op Plan:   Post-operative Plan:   Informed Consent: I have reviewed the patients History and Physical, chart, labs and discussed the procedure including the risks, benefits and alternatives for the proposed anesthesia with the patient or authorized representative who has indicated his/her understanding and acceptance.   Patient has DNR.  Suspend DNR.   Dental Advisory Given and Consent reviewed with POA  Plan Discussed with: Anesthesiologist, CRNA and Surgeon  Anesthesia Plan Comments:        Anesthesia Quick Evaluation

## 2021-12-30 NOTE — Progress Notes (Addendum)
OT Cancellation Note  Patient Details Name: [name]. MRN: [id number] DOB: 1965/10/09   Cancelled Treatment:    Reason Eval/Treat Not Completed: Other (comment). OT attempting to see pt for tx session. RN notified that pt just worked with PT and transport is about to come pick up the pt for ECT treatment. OT to re-attempt at later time/date.  Doneta Public 12/30/2021, 9:06 AM

## 2021-12-30 NOTE — Care Management Important Message (Signed)
Important Message  Patient Details  Name: [name]. MRN: [id number] Date of Birth: 1965/09/06   Medicare Important Message Given:  Yes     Loann Quill 12/30/2021, 3:32 PM

## 2021-12-30 NOTE — Anesthesia Procedure Notes (Signed)
Procedure Name: General with mask airway Date/Time: 12/30/2021 12:21 PM  Performed by: Lily Peer, Ajanee Buren, CRNAPre-anesthesia Checklist: Patient identified, Emergency Drugs available, Suction available and Patient being monitored Patient Re-evaluated:Patient Re-evaluated prior to induction Oxygen Delivery Method: Circle system utilized Preoxygenation: Pre-oxygenation with 100% oxygen Induction Type: IV induction Ventilation: Mask ventilation without difficulty

## 2021-12-30 NOTE — Progress Notes (Signed)
  Progress Note   Patient: [name]. GUR:[id number] DOB: 08/09/1965 DOA: 12/20/2021     10 DOS: the patient was seen and examined on 12/30/2021   Brief hospital course: 56 year old man with ADD, bipolar disorder, dementia, type 2 diabetes mellitus, CKD stage II, hypertension, neuropathy, schizoaffective disorder.  Patient was transferred from [facility] to [facility] to obtain E[facility] treatments.  Psychiatry will try to start ECT treatments on Wednesday or Friday.  Patient initially had [facility] for encephalopathy, DKA, acute kidney injury and rhabdomyolysis.  Patient was found to have [facility] spotted fever and aspiration pneumonia.  Patient completed doxycycline for [facility] spotted fever and IV Zosyn for aspiration pneumonia. ECT started on 12/25/2021.  Assessment and Plan: * Toxic metabolic encephalopathy Dementia without behavioral disturbance [facility]) Patient will receive ECT again today, hopefully the last session.  Condition has improved.   Aspiration pneumonia ([place]) [facility] spotted fever) Completed antibiotics, discussed with speech therapy, given patient frequent ECTs, would not consider advance diet yet. Continue followed by speech therapy, potentially advance diet tomorrow.  Generalized weakness Continue PT/OT.  Still recommending nursing placement.   Hyponatremia Improved.   Thyroid nodule Outpatient follow-up with PCP.   Type 2 diabetes mellitus with hyperlipidemia (HCC) Last hemoglobin A1c elevated at 8.0.  Glucose at goal.   Acute kidney injury superimposed on CKD 3A(HCC) Anemia of chronic disease Renal function improved.   Multiple skin pressure ulcers POA. Stage II at the medial sacrum. Stage II right hip. Stage II right elbow. Deep tissue injury right heel. Patient has been followed by wound care.        Subjective:  Patient condition has improved, currently denies any complaints.  Physical  Exam: Vitals:   12/30/21 0055 12/30/21 0557 12/30/21 0821 12/30/21 1109  BP: 128/78  119/87 119/76  Pulse: 91  84 86  Resp: '16  17 18  '$ Temp: 98.3 F (36.8 C)  98.3 F (36.8 C) 98.3 F (36.8 C)  TempSrc: Oral   Oral  SpO2: 98%  97% 98%  Weight:  81.9 kg  81.9 kg  Height:    '6\' 3"'$  (1.905 m)   General exam: Appears calm and comfortable  Respiratory system: Clear to auscultation. Respiratory effort normal. Cardiovascular system: S1 & S2 heard, RRR. No JVD, murmurs, rubs, gallops or clicks. No pedal edema. Gastrointestinal system: Abdomen is nondistended, soft and nontender. No organomegaly or masses felt. Normal bowel sounds heard. Central nervous system: Alert and oriented. No focal neurological deficits. Extremities: Symmetric 5 x 5 power. Skin: No rashes, lesions or ulcers Psychiatry: Judgement and insight appear normal. Mood & affect appropriate.   Data Reviewed:  There are no new results to review at this time.  Family Communication:   Disposition: Status is: Inpatient Remains inpatient appropriate because: Severity of disease, inpatient procedure.  Planned Discharge Destination: Skilled nursing facility    Time spent: 30 minutes  Author: Sharen Hones, MD 12/30/2021 12:00 PM  For on call review [email or web address].

## 2021-12-30 NOTE — Progress Notes (Signed)
Physical Therapy Treatment Patient Details Name: [name]. MRN: [id number] DOB: April 30, 1965 Today's Date: 12/30/2021   History of Present Illness Pt is a 56 year old male who was admitted to [facility] on 12/01/2021 for altered mental status. Pt with long history of chronic psychotic disorder as well as dyslipidemia, hyperlipidemia, GERD, chronic kidney disease, BPH and type 2 diabetes mellitus with stage 4 chronic kidney disease, with long term current use of insulin. He was brought to [facility] where initially noted to be encephalopathic, DKA, AKI and rhabdomyolysis.  Slowly with fluid and insulin DKA, AKI and rhabdomyolysis resolved.  He was later also diagnosed with [facility] spotted fever for which she received 5 days of doxycycline and hospital course complicated by aspiration pneumonia requiring IV Zosyn.  His mentation was thought secondary to due to underlying psych issue as his MRI and EEG was negative.  Rest of the metabolic work-up was also negative. He transfered to [facility] on 12/20/2021 for ECT. He had been receiving PT/OT at [facility] progressed mobility to getting on B LE (crouched posture) x 5 with scoot pivot transfer to chair.    PT Comments    Patient alert and in bed at start of session, denied pain but did report eye dryness. Pt concerns of insulin, eye drops, and ice chips passed on to RN at end of session. Overall he did demonstrate improved function, but continued to be limited in functional endurance/activity tolerance. Supervision for bed mobility, and sit <> stand twice with RW and CGA. Pt needed extended rest breaks between activities but ambulated 72f twice with RW and CGA. Did exhibit step to gait pattern and HR in 110s-120s with activity as well. The patient would benefit from further skilled PT intervention to continue to progress towards goals. Recommendation remains appropriate.        Recommendations for follow up therapy are one component of  a multi-disciplinary discharge planning process, led by the attending physician.  Recommendations may be updated based on patient status, additional functional criteria and insurance authorization.  Follow Up Recommendations  Skilled nursing-short term rehab (<3 hours/day) Can patient physically be transported by private vehicle: No   Assistance Recommended at Discharge Frequent or constant Supervision/Assistance  Patient can return home with the following Assistance with cooking/housework;Assist for transportation;Help with stairs or ramp for entrance;A little help with bathing/dressing/bathroom;A little help with walking and/or transfers   Equipment Recommendations   (TBD)    Recommendations for Other Services       Precautions / Restrictions Precautions Precautions: Fall     Mobility  Bed Mobility Overal bed mobility: Needs Assistance       Supine to sit: Supervision Sit to supine: Supervision        Transfers Overall transfer level: Needs assistance Equipment used: Rolling walker (2 wheels) Transfers: Sit to/from Stand Sit to Stand: Min guard                Ambulation/Gait   Gait Distance (Feet):  (348fx2) Assistive device: Rolling walker (2 wheels) Gait Pattern/deviations: Step-to pattern           Stairs             Wheelchair Mobility    Modified Rankin (Stroke Patients Only)       Balance Overall balance assessment: Needs assistance Sitting-balance support: Single extremity supported, Feet supported Sitting balance-Leahy Scale: Good     Standing balance support: Bilateral upper extremity supported, Reliant on assistive device for  balance Standing balance-Leahy Scale: Fair                              Cognition Arousal/Alertness: Awake/alert Behavior During Therapy: Flat affect Overall Cognitive Status: No family/caregiver present to determine baseline cognitive functioning                                           Exercises      General Comments        Pertinent Vitals/Pain Pain Assessment Pain Assessment: No/denies pain Pain Location: mentions his eyes are dry    Home Living                          Prior Function            PT Goals (current goals can now be found in the care plan section) Progress towards PT goals: Progressing toward goals    Frequency    Min 2X/week      PT Plan Current plan remains appropriate    Co-evaluation              AM-PAC PT "6 Clicks" Mobility   Outcome Measure  Help needed turning from your back to your side while in a flat bed without using bedrails?: None Help needed moving from lying on your back to sitting on the side of a flat bed without using bedrails?: None Help needed moving to and from a bed to a chair (including a wheelchair)?: None Help needed standing up from a chair using your arms (e.g., wheelchair or bedside chair)?: A Little Help needed to walk in hospital room?: A Little Help needed climbing 3-5 steps with a railing? : A Lot 6 Click Score: 20    End of Session Equipment Utilized During Treatment: Gait belt Activity Tolerance: Patient limited by fatigue;Patient tolerated treatment well Patient left: with call bell/phone within reach;in bed;with bed alarm set Nurse Communication: Mobility status PT Visit Diagnosis: Unsteadiness on feet (R26.81);Muscle weakness (generalized) (M62.81);Difficulty in walking, not elsewhere classified (R26.2)     Time: 1779-3903 PT Time Calculation (min) (ACUTE ONLY): 13 min  Charges:  $Therapeutic Activity: 8-22 mins                     Lieutenant Diego PT, DPT 9:23 AM,12/30/21

## 2021-12-31 LAB — BASIC METABOLIC PANEL
Anion gap: 7 (ref 5–15)
BUN: 18 mg/dL (ref 6–20)
CO2: 25 mmol/L (ref 22–32)
Calcium: 9.4 mg/dL (ref 8.9–10.3)
Chloride: 109 mmol/L (ref 98–111)
Creatinine, Ser: 1.29 mg/dL — ABNORMAL HIGH (ref 0.61–1.24)
GFR, Estimated: >60 mL/min (ref >60)
Glucose, Bld: 117 mg/dL — ABNORMAL HIGH (ref 70–99)
Potassium: 4.1 mmol/L (ref 3.5–5.1)
Sodium: 141 mmol/L (ref 135–145)

## 2021-12-31 LAB — CBC
HCT: 35.7 % — ABNORMAL LOW (ref 39.0–52.0)
Hemoglobin: 12 g/dL — ABNORMAL LOW (ref 13.0–17.0)
MCH: 28.6 pg (ref 26.0–34.0)
MCHC: 33.6 g/dL (ref 30.0–36.0)
MCV: 85.2 fL (ref 80.0–100.0)
Platelets: 133 10*3/uL — ABNORMAL LOW (ref 150–400)
RBC: 4.19 MIL/uL — ABNORMAL LOW (ref 4.22–5.81)
RDW: 15 % (ref 11.5–15.5)
WBC: 4.1 10*3/uL (ref 4.0–10.5)
nRBC: 0 % (ref 0.0–0.2)

## 2021-12-31 LAB — MAGNESIUM: Magnesium: 1.8 mg/dL (ref 1.7–2.4)

## 2021-12-31 LAB — PHOSPHORUS: Phosphorus: 3.7 mg/dL (ref 2.5–4.6)

## 2021-12-31 LAB — GLUCOSE, CAPILLARY
Glucose-Capillary: 109 mg/dL — ABNORMAL HIGH (ref 70–99)
Glucose-Capillary: 126 mg/dL — ABNORMAL HIGH (ref 70–99)
Glucose-Capillary: 147 mg/dL — ABNORMAL HIGH (ref 70–99)
Glucose-Capillary: 144 mg/dL — ABNORMAL HIGH (ref 70–99)

## 2021-12-31 NOTE — Progress Notes (Signed)
Speech Language Pathology Treatment: Dysphagia  Patient Details Name: [name]. MRN: [id number] DOB: 24-May-1965 Today's Date: 12/31/2021 Time: 1856-3149 SLP Time Calculation (min) (ACUTE ONLY): 13 min  Assessment / Plan / Recommendation Clinical Impression  Pt seen for ongoing diet management. Pt presents with increased alertness, social interaction as well as asking appropriate questions. In addition, he demonstrates good recall of information related to current diet as well as plans for "a different water" (I.e., possible upgrade to thin liquids).   Pt currently demonstrates adequate oropharyngeal abilities when consuming trials of regular diet textures. As such, recommend upgrade to regular diet.   Given pt's silent aspiration of thin liquids, recommend a Modified Barium at next available time - currently scheduled for Wednesday, September 13th at 10am with this therapist. Education provided to pt on rationale for upgrading food textures and need for an instrumental study for further upgrade of liquids. Pt asked appropriate questions and all were answered to pt's satisfaction.     HPI HPI: Pt is a 56 year old male who was admitted to [facility] on 12/01/2021 for altered mental status. Pt with long history of chronic psychotic disorder as well as dyslipidemia, hyperlipidemia, GERD, chronic kidney disease, BPH and type 2 diabetes mellitus with stage 4 chronic kidney disease, with long term current use of insulin. Pt received ST services for dysphagia while at [facility] including a Modified Barium Swallow Study on  12/18/2021 revealing silent aspiration of thin liquids. Diet recommendation was dysphagia 2 with nectar thick liquids and medicine crushed in puree. Pt transferred to [facility] on 12/20/2021 for ECT treatments. Last ECT treatment was on 12/30/2021.      SLP Plan  MBS      Recommendations for follow up therapy are one component of a multi-disciplinary discharge planning process,  led by the attending physician.  Recommendations may be updated based on patient status, additional functional criteria and insurance authorization.    Recommendations  Diet recommendations: Regular;Nectar-thick liquid Liquids provided via: Cup Medication Administration: Whole meds with liquid Supervision: Patient able to self feed Compensations: Minimize environmental distractions;Slow rate;Small sips/bites Postural Changes and/or Swallow Maneuvers: Seated upright 90 degrees                Oral Care Recommendations: Oral care BID Follow Up Recommendations: Skilled nursing-short term rehab (<3 hours/day) Assistance recommended at discharge: Frequent or constant Supervision/Assistance SLP Visit Diagnosis: Dysphagia, oropharyngeal phase (R13.12) Plan: MBS         Jeniah Kishi B. Rutherford Nail, M.S., CCC-SLP, Mining engineer Certified Brain Injury Export  [place] [phone] Ascom [phone] Fax [phone]

## 2021-12-31 NOTE — Progress Notes (Signed)
Occupational Therapy Treatment Patient Details Name: [name]. MRN: [id number] DOB: Mar 06, 1966 Today's Date: 12/31/2021   History of present illness Pt is a 56 year old male who was admitted to [facility] on 12/01/2021 for altered mental status. Pt with long history of chronic psychotic disorder as well as dyslipidemia, hyperlipidemia, GERD, chronic kidney disease, BPH and type 2 diabetes mellitus with stage 4 chronic kidney disease, with long term current use of insulin. He was brought to [facility] where initially noted to be encephalopathic, DKA, AKI and rhabdomyolysis.  Slowly with fluid and insulin DKA, AKI and rhabdomyolysis resolved.  He was later also diagnosed with [facility] spotted fever for which she received 5 days of doxycycline and hospital course complicated by aspiration pneumonia requiring IV Zosyn.  His mentation was thought secondary to due to underlying psych issue as his MRI and EEG was negative.  Rest of the metabolic work-up was also negative. He transfered to [facility] on 12/20/2021 for ECT. He had been receiving PT/OT at [facility] progressed mobility to getting on B LE (crouched posture) x 5 with scoot pivot transfer to chair.   OT comments  Upon entering session, pt resting in bed and agreeable to OT. Pt completed toilet transfer, clothing management during toileting, and stood at the sink to complete grooming tasks with Min guard. Pt followed multi-step commands well this date. Pt required intermittent cues for safety throughout session. Pt left as received with all needs in reach. Pt was motivated to participate in therapy and is making progress toward goal completion. D/C recommendation remains appropriate. OT will continue to follow acutely.     Recommendations for follow up therapy are one component of a multi-disciplinary discharge planning process, led by the attending physician.  Recommendations may be updated based on patient status, additional  functional criteria and insurance authorization.    Follow Up Recommendations  Skilled nursing-short term rehab (<3 hours/day)    Assistance Recommended at Discharge Frequent or constant Supervision/Assistance  Patient can return home with the following  Assistance with cooking/housework;Assistance with feeding;Direct supervision/assist for medications management;Direct supervision/assist for financial management;Assist for transportation;Help with stairs or ramp for entrance;A lot of help with bathing/dressing/bathroom;A lot of help with walking and/or transfers   Equipment Recommendations  Other (comment) (defer to next venue of care)    Recommendations for Other Services      Precautions / Restrictions Precautions Precautions: Fall Restrictions Weight Bearing Restrictions: No       Mobility Bed Mobility Overal bed mobility: Needs Assistance Bed Mobility: Supine to Sit, Sit to Supine     Supine to sit: Supervision, HOB elevated Sit to supine: Supervision        Transfers Overall transfer level: Needs assistance Equipment used: Rolling walker (2 wheels) Transfers: Sit to/from Stand Sit to Stand: Min guard           General transfer comment: STS from EOB and toilet     Balance Overall balance assessment: Needs assistance Sitting-balance support: Single extremity supported, Feet supported Sitting balance-Leahy Scale: Good     Standing balance support: Bilateral upper extremity supported, Reliant on assistive device for balance Standing balance-Leahy Scale: Fair                             ADL either performed or assessed with clinical judgement   ADL Overall ADL's : Needs assistance/impaired     Grooming: Wash/dry hands;Wash/dry face;Standing;Min guard Grooming Details (  indicate cue type and reason): sink level grooming tasks                 Toilet Transfer: Min guard;Rolling walker (2 wheels);Ambulation;Grab bars;Regular Producer, television/film/video and Hygiene: Min guard;Sit to/from stand Toileting - Clothing Manipulation Details (indicate cue type and reason): for don/doff underwear during toileting     Functional mobility during ADLs: Min guard;Rolling walker (2 wheels) (to walk to bathroom and ~40 ft in hallway)      Extremity/Trunk Assessment Upper Extremity Assessment Upper Extremity Assessment: Generalized weakness   Lower Extremity Assessment Lower Extremity Assessment: Generalized weakness   Cervical / Trunk Assessment Cervical / Trunk Assessment: Normal    Vision Baseline Vision/History: 1 Wears glasses     Perception     Praxis      Cognition Arousal/Alertness: Awake/alert Behavior During Therapy: WFL for tasks assessed/performed Overall Cognitive Status: No family/caregiver present to determine baseline cognitive functioning                                 General Comments: Pt followed commands well, alert and able to engage in conversation appropriately         Exercises      Shoulder Instructions       General Comments      Pertinent Vitals/ Pain       Pain Assessment Pain Assessment: No/denies pain  Home Living                                          Prior Functioning/Environment              Frequency  Min 2X/week        Progress Toward Goals  OT Goals(current goals can now be found in the care plan section)  Progress towards OT goals: Progressing toward goals  Acute Rehab OT Goals Patient Stated Goal: to go home OT Goal Formulation: With patient/family Time For Goal Achievement: 01/04/22 Potential to Achieve Goals: [place] Discharge plan remains appropriate;Frequency remains appropriate    Co-evaluation                 AM-PAC OT "6 Clicks" Daily Activity     Outcome Measure   Help from another person eating meals?: None Help from another person taking care of personal grooming?: A  Little Help from another person toileting, which includes using toliet, bedpan, or urinal?: A Lot Help from another person bathing (including washing, rinsing, drying)?: A Lot Help from another person to put on and taking off regular upper body clothing?: A Little Help from another person to put on and taking off regular lower body clothing?: A Lot 6 Click Score: 16    End of Session Equipment Utilized During Treatment: Gait belt;Rolling walker (2 wheels)  OT Visit Diagnosis: Unsteadiness on feet (R26.81);Other abnormalities of gait and mobility (R26.89);Muscle weakness (generalized) (M62.81);Low vision, both eyes (H54.2);Other symptoms and signs involving cognitive function   Activity Tolerance Patient tolerated treatment well   Patient Left with call bell/phone within reach;in bed;with bed alarm set   Nurse Communication Mobility status        Time: 0932-3557 OT Time Calculation (min): 15 min  Charges: OT General Charges $OT Visit: 1 Visit OT Treatments $Self Care/Home Management : 8-22 mins   Lanelle Bal  [place] MS, OTR/L ascom [phone]  12/31/21, 6:24 PM

## 2021-12-31 NOTE — Progress Notes (Signed)
  Progress Note   Patient: [name]. JSE:[id number] DOB: Jan 25, 1966 DOA: 12/20/2021     11 DOS: the patient was seen and examined on 12/31/2021   Brief hospital course: 56 year old man with ADD, bipolar disorder, dementia, type 2 diabetes mellitus, CKD stage II, hypertension, neuropathy, schizoaffective disorder.  Patient was transferred from [facility] over to [facility] to obtain ECT treatments.  Psychiatry will try to start ECT treatments on Wednesday or Friday.  Patient initially had [facility] for encephalopathy, DKA, acute kidney injury and rhabdomyolysis.  Patient was found to have [facility] spotted fever and aspiration pneumonia.  Patient completed doxycycline for [facility] spotted fever and IV Zosyn for aspiration pneumonia.  Antibiotics completed. ECT started on 12/25/2021, completed on 9/12.  Pending nursing home placement.  Assessment and Plan: * Toxic metabolic encephalopathy Dementia without behavioral disturbance [facility]) Patient received the final treatment with ECT today, condition much improved.  Mental status much better.  No additional treatment is planned.   Aspiration pneumonia ([place]) [facility] spotted fever) Completed antibiotics. Diet advanced by speech therapy.   Generalized weakness Patient is improving, however, PT/OT still recommending nursing home placement.   Hyponatremia Improved.   Thyroid nodule Outpatient follow-up with PCP.   Type 2 diabetes mellitus with hyperlipidemia (HCC) Last hemoglobin A1c elevated at 8.0.  Glucose at goal.   Acute kidney injury superimposed on CKD 3A(HCC) Anemia of chronic disease Renal function improved.   Multiple skin pressure ulcers POA. Stage II at the medial sacrum. Stage II right hip. Stage II right elbow. Deep tissue injury right heel. Patient has been followed by wound care.        Subjective:  Patient doing much better, able to walk with assist.  Dysphagia  essentially resolved.  No longer has any confusion.  Physical Exam: Vitals:   12/30/21 2114 12/31/21 0455 12/31/21 0500 12/31/21 0743  BP: 107/77 116/83  105/72  Pulse: 86 87  84  Resp: '18 16  16  '$ Temp: 98.7 F (37.1 C) 98.3 F (36.8 C)  98.5 F (36.9 C)  TempSrc:      SpO2: 100% 97%  95%  Weight:   83 kg   Height:       General exam: Appears calm and comfortable  Respiratory system: Clear to auscultation. Respiratory effort normal. Cardiovascular system: S1 & S2 heard, RRR. No JVD, murmurs, rubs, gallops or clicks. No pedal edema. Gastrointestinal system: Abdomen is nondistended, soft and nontender. No organomegaly or masses felt. Normal bowel sounds heard. Central nervous system: Alert and oriented. No focal neurological deficits. Extremities: Symmetric 5 x 5 power. Skin: No rashes, lesions or ulcers Psychiatry: Judgement and insight appear normal. Mood & affect appropriate.   Data Reviewed:  Lab results reviewed.  Family Communication: None  Disposition: Status is: Inpatient Remains inpatient appropriate because: Severity of disease, unsafe discharge.  Planned Discharge Destination: Skilled nursing facility    Time spent: 32 minutes  Author: Sharen Hones, MD 12/31/2021 11:52 AM  For on call review [email or web address].

## 2021-12-31 NOTE — TOC Progression Note (Signed)
Transition of Care (TOC) - Progression Note    Patient Details  Name: [name]. MRN: [id number] Date of Birth: [birth year]  Transition of Care [facility]) CM/SW [place], [place] Phone Number: 12/31/2021, 2:08 PM  Clinical Narrative:     CSW spoke with patient's sister [name] who reports preferences for [facility] and [place], [place] has reached out to [place] with admissions with Chanda Busing at [phone] and Claiborne Billings with [facility] to review referral. Pending bed offer at this time.         Expected Discharge Plan and Services                                                 Social Determinants of Health (SDOH) Interventions    Readmission Risk Interventions    01/02/2020    4:09 PM  Readmission Risk Prevention Plan  Transportation Screening Complete  PCP or Specialist Appt within 3-5 Days Complete  HRI or [place] Complete  Social Work Consult for [place] Planning/Counseling Complete  Palliative Care Screening Not Applicable  Medication Review Press photographer) Referral to Pharmacy

## 2021-12-31 NOTE — Progress Notes (Signed)
Physical Therapy Treatment Patient Details Name: [name]. MRN: [id number] DOB: 08-23-1965 Today's Date: 12/31/2021   History of Present Illness Pt is a 56 year old male who was admitted to [facility] on 12/01/2021 for altered mental status. Pt with long history of chronic psychotic disorder as well as dyslipidemia, hyperlipidemia, GERD, chronic kidney disease, BPH and type 2 diabetes mellitus with stage 4 chronic kidney disease, with long term current use of insulin. He was brought to [facility] where initially noted to be encephalopathic, DKA, AKI and rhabdomyolysis.  Slowly with fluid and insulin DKA, AKI and rhabdomyolysis resolved.  He was later also diagnosed with [facility] spotted fever for which she received 5 days of doxycycline and hospital course complicated by aspiration pneumonia requiring IV Zosyn.  His mentation was thought secondary to due to underlying psych issue as his MRI and EEG was negative.  Rest of the metabolic work-up was also negative. He transfered to [facility] on 12/20/2021 for ECT. He had been receiving PT/OT at [facility] progressed mobility to getting on B LE (crouched posture) x 5 with scoot pivot transfer to chair.    PT Comments    Patient alert, agreeable to PT, looked to go for a walk, denied pain. Pt continued to be self limiting due to fatigue in BLE per his report, but did perform bed mobility modI, sit <> stand with RW, CGA. He ambulated two bouts, ~26f and then 354f No LOB noted, improved step length today. Pt up in recliner with all needs in reach at end of session. The patient would benefit from further skilled PT intervention to continue to progress towards goals. Recommendation remains appropriate.     Recommendations for follow up therapy are one component of a multi-disciplinary discharge planning process, led by the attending physician.  Recommendations may be updated based on patient status, additional functional criteria and  insurance authorization.  Follow Up Recommendations  Skilled nursing-short term rehab (<3 hours/day) Can patient physically be transported by private vehicle: No   Assistance Recommended at Discharge Frequent or constant Supervision/Assistance  Patient can return home with the following Assistance with cooking/housework;Assist for transportation;Help with stairs or ramp for entrance;A little help with bathing/dressing/bathroom;A little help with walking and/or transfers   Equipment Recommendations   (TBD)    Recommendations for Other Services       Precautions / Restrictions Precautions Precautions: Fall Restrictions Weight Bearing Restrictions: No     Mobility  Bed Mobility Overal bed mobility: Needs Assistance Bed Mobility: Supine to Sit     Supine to sit: Supervision          Transfers Overall transfer level: Needs assistance Equipment used: Rolling walker (2 wheels) Transfers: Sit to/from Stand Sit to Stand: Min guard                Ambulation/Gait   Gait Distance (Feet):  ([id number] device: Rolling walker (2 wheels)             Stairs             Wheelchair Mobility    Modified Rankin (Stroke Patients Only)       Balance Overall balance assessment: Needs assistance Sitting-balance support: Single extremity supported, Feet supported Sitting balance-Leahy Scale: Good     Standing balance support: Bilateral upper extremity supported, Reliant on assistive device for balance Standing balance-Leahy Scale: Fair  Cognition Arousal/Alertness: Awake/alert Behavior During Therapy: WFL for tasks assessed/performed Overall Cognitive Status: No family/caregiver present to determine baseline cognitive functioning                                 General Comments: Pt alert, cooperative.        Exercises      General Comments        Pertinent Vitals/Pain Pain  Assessment Pain Assessment: No/denies pain Pain Location: reported some gum pain due to chewing cookies    Home Living                          Prior Function            PT Goals (current goals can now be found in the care plan section) Progress towards PT goals: Progressing toward goals    Frequency    Min 2X/week      PT Plan Current plan remains appropriate    Co-evaluation              AM-PAC PT "6 Clicks" Mobility   Outcome Measure  Help needed turning from your back to your side while in a flat bed without using bedrails?: None Help needed moving from lying on your back to sitting on the side of a flat bed without using bedrails?: None Help needed moving to and from a bed to a chair (including a wheelchair)?: None Help needed standing up from a chair using your arms (e.g., wheelchair or bedside chair)?: A Little Help needed to walk in hospital room?: A Little Help needed climbing 3-5 steps with a railing? : A Lot 6 Click Score: 20    End of Session Equipment Utilized During Treatment: Gait belt Activity Tolerance: Patient limited by fatigue;Patient tolerated treatment well Patient left: with call bell/phone within reach;in chair;with chair alarm set;with bed alarm set Nurse Communication: Mobility status PT Visit Diagnosis: Unsteadiness on feet (R26.81);Muscle weakness (generalized) (M62.81);Difficulty in walking, not elsewhere classified (R26.2)     Time: 5277-8242 PT Time Calculation (min) (ACUTE ONLY): 12 min  Charges:  $Therapeutic Activity: 8-22 mins                     Lieutenant Diego PT, DPT 4:41 PM,12/31/21

## 2022-01-01 ENCOUNTER — Inpatient Hospital Stay: Payer: Medicare Other

## 2022-01-01 DIAGNOSIS — D696 Thrombocytopenia, unspecified: Secondary | ICD-10-CM

## 2022-01-01 DIAGNOSIS — N1831 Chronic kidney disease, stage 3a: Secondary | ICD-10-CM

## 2022-01-01 LAB — GLUCOSE, CAPILLARY
Glucose-Capillary: 127 mg/dL — ABNORMAL HIGH (ref 70–99)
Glucose-Capillary: 168 mg/dL — ABNORMAL HIGH (ref 70–99)
Glucose-Capillary: 121 mg/dL — ABNORMAL HIGH (ref 70–99)
Glucose-Capillary: 96 mg/dL (ref 70–99)

## 2022-01-01 NOTE — Progress Notes (Addendum)
Occupational Therapy Treatment Patient Details Name: [name]. MRN: [id number] DOB: 05-Nov-1965 Today's Date: 01/01/2022   History of present illness Pt is a 56 year old male who was admitted to [facility] on 12/01/2021 for altered mental status. Pt with long history of chronic psychotic disorder as well as dyslipidemia, hyperlipidemia, GERD, chronic kidney disease, BPH and type 2 diabetes mellitus with stage 4 chronic kidney disease, with long term current use of insulin. He was brought to [facility] where initially noted to be encephalopathic, DKA, AKI and rhabdomyolysis.  Slowly with fluid and insulin DKA, AKI and rhabdomyolysis resolved.  He was later also diagnosed with [facility] spotted fever for which she received 5 days of doxycycline and hospital course complicated by aspiration pneumonia requiring IV Zosyn.  His mentation was thought secondary to due to underlying psych issue as his MRI and EEG was negative.  Rest of the metabolic work-up was also negative. He transfered to [facility] on 12/20/2021 for ECT. He had been receiving PT/OT at [facility] progressed mobility to getting on B LE (crouched posture) x 5 with scoot pivot transfer to chair.   OT comments  Chart reviewed, pt greeted in room agreeable to OT tx session. Pt is alert and oriented x4, presents with fair-good safety awareness throughout.Tx session targeted improving tolerance for functional mobility, safe ADL completion for improved indep ADL completion. Pt performs toileting with supervision, intermittent vcs for RW technique. Pt amb in hallway 240' with RW with supervision, no rest breaks required. Spo2 >95% on RA throughout. Education provided re: discharge recommendations, home safety. Pt feels he would be safe to return home with prior level of assist. Pt has demonstrated significant progress in functional status and pt could discharge home with [facility] pending he has assist with ADLs/IADLs as PLOF. Team notified  of findings. Pt is left in bed, NAD, all needs met. OT will follow acutely.    Recommendations for follow up therapy are one component of a multi-disciplinary discharge planning process, led by the attending physician.  Recommendations may be updated based on patient status, additional functional criteria and insurance authorization.    Follow Up Recommendations  Skilled nursing-short term rehab (<3 hours/day)    Assistance Recommended at Discharge Frequent or constant Supervision/Assistance  Patient can return home with the following  Assistance with cooking/housework;Direct supervision/assist for medications management;Direct supervision/assist for financial management;Assist for transportation;Help with stairs or ramp for entrance   Equipment Recommendations  Other (comment) (2ww)    Recommendations for Other Services      Precautions / Restrictions Precautions Precautions: Fall Restrictions Weight Bearing Restrictions: No       Mobility Bed Mobility Overal bed mobility: Needs Assistance Bed Mobility: Sit to Supine       Sit to supine: Modified independent (Device/Increase time)        Transfers Overall transfer level: Needs assistance Equipment used: Rolling walker (2 wheels) Transfers: Sit to/from Stand Sit to Stand: Supervision                 Balance Overall balance assessment: Needs assistance Sitting-balance support: Single extremity supported, Feet supported Sitting balance-Leahy Scale: Good     Standing balance support: Bilateral upper extremity supported, Reliant on assistive device for balance Standing balance-Leahy Scale: Fair                             ADL either performed or assessed with clinical judgement  ADL Overall ADL's : Needs assistance/impaired     Grooming: Wash/dry hands;Standing;Supervision/safety Grooming Details (indicate cue type and reason): sink level with RW             Lower Body Dressing: Minimal  assistance Lower Body Dressing Details (indicate cue type and reason): socks on toilet     Toileting- Clothing Manipulation and Hygiene: Sit to/from stand;Supervision/safety       Functional mobility during ADLs: Supervision/safety;Rolling walker (2 wheels);Min guard (240' in hallway with RW, no rest breaks required)      Extremity/Trunk Assessment              Vision       Perception     Praxis      Cognition Arousal/Alertness: Awake/alert Behavior During Therapy: WFL for tasks assessed/performed Overall Cognitive Status: No family/caregiver present to determine baseline cognitive functioning Area of Impairment: Problem solving, Safety/judgement                         Safety/Judgement: Decreased awareness of safety   Problem Solving: Slow processing, Requires verbal cues          Exercises      Shoulder Instructions       General Comments      Pertinent Vitals/ Pain       Pain Assessment Pain Assessment: No/denies pain  Home Living                                          Prior Functioning/Environment              Frequency  Min 2X/week        Progress Toward Goals  OT Goals(current goals can now be found in the care plan section)  Progress towards OT goals: Progressing toward goals     Plan Discharge plan remains appropriate;Frequency remains appropriate    Co-evaluation                 AM-PAC OT "6 Clicks" Daily Activity     Outcome Measure   Help from another person eating meals?: None Help from another person taking care of personal grooming?: None Help from another person toileting, which includes using toliet, bedpan, or urinal?: None Help from another person bathing (including washing, rinsing, drying)?: A Little Help from another person to put on and taking off regular upper body clothing?: None Help from another person to put on and taking off regular lower body clothing?: A Little 6  Click Score: 22    End of Session Equipment Utilized During Treatment: Gait belt;Rolling walker (2 wheels)  OT Visit Diagnosis: Unsteadiness on feet (R26.81);Other abnormalities of gait and mobility (R26.89);Muscle weakness (generalized) (M62.81);Low vision, both eyes (H54.2);Other symptoms and signs involving cognitive function   Activity Tolerance Patient tolerated treatment well   Patient Left with call bell/phone within reach;in bed;with bed alarm set   Nurse Communication Mobility status        Time: 1340-1400 OT Time Calculation (min): 20 min  Charges: OT General Charges $OT Visit: 1 Visit OT Treatments $Self Care/Home Management : 8-22 mins  Shanon Payor, OTD OTR/L  01/01/22, 3:32 PM

## 2022-01-01 NOTE — Progress Notes (Addendum)
Spoke with patient's sister to advise no bed offers had been made. CM to follow up   Call  placed to [facility] admissions. Left message for Estill Bamberg regarding update for referral.  Call for Vilma Meckel at [facility] regarding update on referral. Brittney request referral be sent back. Referral resubmitted in [place]  Retrieved call from Abigail Butts in admissions at [facility] regarding referral. Patient will not be offered a bed d/t age. Facility does not accept patient's under age 19.

## 2022-01-01 NOTE — NC FL2 (Signed)
[place] LEVEL OF CARE SCREENING TOOL     IDENTIFICATION  Patient Name: [name]. Birthdate: 1966/01/22 Sex: male Admission Date (Current Location): 12/20/2021  [facility] [place] and [place] Number:  Engineering geologist and Address:         Provider Number:    Attending Physician Name and Address:  Loletha Grayer, MD  Relative Name and Phone Number:  [name] [id number],[phone]    Current Level of Care: Hospital Recommended Level of Care: [place] Prior Approval Number:    Date Approved/Denied:   PASRR Number: [id number]  Discharge Plan: SNF    Current Diagnoses: Patient Active Problem List   Diagnosis Date Noted   Generalized weakness 12/24/2021   Hyponatremia 02/72/5366   Toxic metabolic encephalopathy 44/06/4740   Aspiration pneumonia ([place]) 12/15/2021   RMSF [facility] spotted fever) 12/15/2021   Malnutrition of moderate degree 12/06/2021   Thyroid nodule 12/04/2021   Hypernatremia 12/04/2021   Stage 2 skin ulcer of sacral region ([place]) 12/04/2021   Diabetic ketoacidosis without coma associated with type 2 diabetes mellitus ([place])    Encephalopathy acute 12/01/2021   Type 2 diabetes mellitus with hyperlipidemia ([place]) 01/26/2021   Tachycardia 08/30/2020   Pressure injury of skin 01/02/2020   Dehydration    Rhabdomyolysis 01/01/2020   Recurrent falls 05/15/2019   Acute renal failure superimposed on stage 3a chronic kidney disease ([place]) 05/14/2019   Closed displaced fracture of lateral malleolus of left fibula 08/02/2017   BPH (benign prostatic hyperplasia) 07/24/2017   Hemorrhoid 05/27/2017   Shoulder arthritis 05/27/2017   Diabetic neuropathy ([place]) 05/27/2017   Degenerative disc disease, lumbar 02/04/2017   Constipation    History of seizures 09/20/2016   Dementia without behavioral disturbance ([place]) 09/20/2016   Acute lower UTI    Urinary retention    HLD (hyperlipidemia) 09/07/2016   GERD (gastroesophageal  reflux disease) 09/07/2016   PTSD (post-traumatic stress disorder) 09/07/2016   Fall 09/07/2016   Chronic kidney disease    Anorexia nervosa, restricting type, in full remission, moderate 01/05/2016   Ataxia 10/14/2015   Hypokalemia 10/03/2015   Hemiparesis ([place])    Dyslipidemia 05/31/2014   Insomnia 11/11/2013   Degenerative joint disease (DJD) of hip 08/16/2013   Anemia of chronic disease 10/31/2012   Acute midline low back pain without sciatica 05/20/2011   Erectile dysfunction 02/17/2011   Constipation - functional 02/17/2011   Hypogonadism male 05/20/2010   Demoralization and apathy 05/20/2010   Schizoaffective disorder, bipolar type ([place]) 01/18/2010   Right shoulder pain 01/18/2010   Anxiety state 12/05/2006   Essential hypertension 12/05/2006    Orientation RESPIRATION BLADDER Height & Weight     Self, Time, Situation, Place  Normal Continent Weight: 80.7 kg Height:  '6\' 3"'$  (190.5 cm)  BEHAVIORAL SYMPTOMS/MOOD NEUROLOGICAL BOWEL NUTRITION STATUS  Other (Comment) (n/a)  (n/a) Incontinent Diet  AMBULATORY STATUS COMMUNICATION OF NEEDS Skin   Limited Assist Verbally Bruising (bilater buttocks)                       Personal Care Assistance Level of Assistance  Bathing, Dressing Bathing Assistance: Limited assistance   Dressing Assistance: Limited assistance     Functional Limitations Info             SPECIAL CARE FACTORS FREQUENCY  PT (By licensed PT), OT (By licensed OT)                    Contractures Contractures Info:  Not present    Additional Factors Info  Code Status, Allergies Code Status Info: DNR Allergies Info: Vicodin (Hydrocodone-acetaminophen)           Current Medications (01/01/2022):  This is the current hospital active medication list Current Facility-Administered Medications  Medication Dose Route Frequency Provider Last Rate Last Admin   0.9 %  sodium chloride infusion  500 mL Intravenous Once Clapacs, Madie Reno, MD        acetaminophen (TYLENOL) tablet 650 mg  650 mg Oral Q6H PRN Amin, Ankit Chirag, MD   650 mg at 12/27/21 2026   ARIPiprazole (ABILIFY) tablet 10 mg  10 mg Oral Daily Clapacs, John T, MD   10 mg at 01/01/22 5643   dextromethorphan-guaiFENesin ([place] DM) 30-600 MG per 12 hr tablet 1 tablet  1 tablet Oral BID PRN Amin, Ankit Chirag, MD       enoxaparin (LOVENOX) injection 40 mg  40 mg Subcutaneous Daily Sharen Hones, MD   40 mg at 01/01/22 3295   hydrALAZINE (APRESOLINE) injection 10 mg  10 mg Intravenous Q4H PRN Amin, Ankit Chirag, MD       insulin aspart (novoLOG) injection 0-15 Units  0-15 Units Subcutaneous TID WC Amin, Ankit Chirag, MD   3 Units at 01/01/22 1140   insulin glargine-yfgn (SEMGLEE) injection 35 Units  35 Units Subcutaneous Daily Amin, Ankit Chirag, MD   35 Units at 01/01/22 0938   ipratropium-albuterol (DUONEB) 0.5-2.5 (3) MG/3ML nebulizer solution 3 mL  3 mL Nebulization Q4H PRN Amin, Ankit Chirag, MD       magnesium oxide (MAG-OX) tablet 400 mg  400 mg Oral Daily Wieting, Richard, MD   400 mg at 01/01/22 0937   metoprolol tartrate (LOPRESSOR) injection 5 mg  5 mg Intravenous Q4H PRN Amin, Ankit Chirag, MD       nicotine (NICODERM CQ - dosed in mg/24 hours) patch 21 mg  21 mg Transdermal Daily Amin, Ankit Chirag, MD   21 mg at 12/31/21 0959   ondansetron (ZOFRAN) injection 4 mg  4 mg Intravenous Q6H PRN Sharen Hones, MD   4 mg at 12/30/21 1043   polyvinyl alcohol (LIQUIFILM TEARS) 1.4 % ophthalmic solution 1 drop  1 drop Both Eyes PRN Rometta Emery, RN   1 drop at 12/30/21 1884   senna-docusate (Senokot-S) tablet 2 tablet  2 tablet Oral QHS Loletha Grayer, MD   2 tablet at 12/31/21 2226   sodium phosphate (FLEET) 7-19 GM/118ML enema 1 enema  1 enema Rectal Daily PRN Loletha Grayer, MD       traZODone (DESYREL) tablet 50 mg  50 mg Oral QHS PRN Damita Lack, MD   50 mg at 12/31/21 2234     Discharge Medications: Please see discharge summary for a list of discharge  medications.  Relevant Imaging Results:  Relevant Lab Results:   Additional Information SS#[id number]  Laurena Slimmer, RN

## 2022-01-01 NOTE — Progress Notes (Signed)
Modified Barium Swallow Progress Note  Patient Details  Name: [name]. MRN: [id number] Date of Birth: 02/20/1966  Today's Date: 01/01/2022  Modified Barium Swallow completed.  Full report located under Chart Review in the Imaging Section.  Brief recommendations include the following:  Clinical Impression  Pt presents with much improved mentation and oropharyngeal abilities when compared to his previous MBSS on 12/18/2021.       At this time, pt's current abilities are likely considered to be at baseline. Based on impairment, pt has likely been experiencing chronic dysphagia for an extended period of time. Pt presents with mild pharyngeal dysphagia. Osteophytes specifically at C3 and C4 impede complete epiglottic deflection. As a result, pt intermittently penetrates thin liquids via cup with 1 episode of trace silent aspiration across multiple trials. When reviewing pt's chart, none of his chest imaging revealed any suspicion for aspiration. Given the chronic nature of pt's dysphagia and his ambulatory status, recommend returning pt to thin liquids via cup sips.   Swallow Evaluation Recommendations       SLP Diet Recommendations: Regular solids;Thin liquid   Liquid Administration via: Cup   Medication Administration: Whole meds with liquid   Supervision: Patient able to self feed   Compensations: Minimize environmental distractions;Slow rate;Small sips/bites   Postural Changes: Remain semi-upright after after feeds/meals (Comment);Seated upright at 90 degrees   Oral Care Recommendations: Oral care BID      Robynn Marcel B. Rutherford Nail, M.S., CCC-SLP, Mining engineer Certified Brain Injury [place]  [place] Office [phone] Ascom [phone] Fax [phone]

## 2022-01-01 NOTE — Assessment & Plan Note (Addendum)
Platelet count dropped to 133 on 12/31/2021.  Hold Lovenox.  Platelet count 141 on 01/02/2022.  Platelet count improved to 152 upon discharge.

## 2022-01-01 NOTE — Progress Notes (Signed)
Physical Therapy Treatment Patient Details Name: [name]. MRN: [id number] DOB: 12-24-1965 Today's Date: 01/01/2022   History of Present Illness Pt is a 56 year old male who was admitted to [facility] on 12/01/2021 for altered mental status. Pt with long history of chronic psychotic disorder as well as dyslipidemia, hyperlipidemia, GERD, chronic kidney disease, BPH and type 2 diabetes mellitus with stage 4 chronic kidney disease, with long term current use of insulin. He was brought to [facility] where initially noted to be encephalopathic, DKA, AKI and rhabdomyolysis.  Slowly with fluid and insulin DKA, AKI and rhabdomyolysis resolved.  He was later also diagnosed with [facility] spotted fever for which she received 5 days of doxycycline and hospital course complicated by aspiration pneumonia requiring IV Zosyn.  His mentation was thought secondary to due to underlying psych issue as his MRI and EEG was negative.  Rest of the metabolic work-up was also negative. He transfered to [facility] on 12/20/2021 for ECT. He had been receiving PT/OT at [facility] progressed mobility to getting on B LE (crouched posture) x 5 with scoot pivot transfer to chair.    PT Comments    Patient alert, agreeable to PT, denied pain. He was able to perform bed mobility modI. minA for lower body dressing in sitting and in standing with RW, required at least single UE support throughout. He was able to perform sit <> stand with supervision and RW. He ambulated 121f with RW and CGA-supervision, no LOB and improved step length and tolerance noted today. The patient would benefit from further skilled PT intervention to continue to progress towards goals. Recommendation updated at this time to HHPT with intermittent supervision due to pt's progress with functional mobility. MD and RN notified of change in recommendation.     Recommendations for follow up therapy are one component of a multi-disciplinary discharge  planning process, led by the attending physician.  Recommendations may be updated based on patient status, additional functional criteria and insurance authorization.  Follow Up Recommendations  Home health PT Can patient physically be transported by private vehicle: Yes   Assistance Recommended at Discharge Intermittent Supervision/Assistance  Patient can return home with the following Assistance with cooking/housework;Assist for transportation;Help with stairs or ramp for entrance;A little help with bathing/dressing/bathroom;A little help with walking and/or transfers   Equipment Recommendations  Rolling walker (2 wheels)    Recommendations for Other Services       Precautions / Restrictions Precautions Precautions: Fall Restrictions Weight Bearing Restrictions: No     Mobility  Bed Mobility Overal bed mobility: Modified Independent                  Transfers Overall transfer level: Needs assistance Equipment used: Rolling walker (2 wheels) Transfers: Sit to/from Stand Sit to Stand: Supervision           General transfer comment: assisted with dressing minA in sitting and in standing    Ambulation/Gait Ambulation/Gait assistance: Min guard, Supervision Gait Distance (Feet): 190 Feet Assistive device: Rolling walker (2 wheels) Gait Pattern/deviations: Step-through pattern       General Gait Details: no LOB, safe use of RW   Stairs             Wheelchair Mobility    Modified Rankin (Stroke Patients Only)       Balance Overall balance assessment: Needs assistance Sitting-balance support: Feet supported Sitting balance-Leahy Scale: Good     Standing balance support: Reliant on assistive device  for balance, Single extremity supported Standing balance-Leahy Scale: Fair Standing balance comment: ADLs in sitting and standing at least unilateral support                            Cognition Arousal/Alertness:  Awake/alert Behavior During Therapy: WFL for tasks assessed/performed Overall Cognitive Status: No family/caregiver present to determine baseline cognitive functioning                                 General Comments: Pt followed commands well, alert and able to engage in conversation appropriately        Exercises      General Comments        Pertinent Vitals/Pain Pain Assessment Pain Assessment: No/denies pain    Home Living                          Prior Function            PT Goals (current goals can now be found in the care plan section) Progress towards PT goals: Progressing toward goals    Frequency    Min 2X/week      PT Plan Discharge plan needs to be updated    Co-evaluation              AM-PAC PT "6 Clicks" Mobility   Outcome Measure  Help needed turning from your back to your side while in a flat bed without using bedrails?: None Help needed moving from lying on your back to sitting on the side of a flat bed without using bedrails?: None Help needed moving to and from a bed to a chair (including a wheelchair)?: None Help needed standing up from a chair using your arms (e.g., wheelchair or bedside chair)?: None Help needed to walk in hospital room?: None Help needed climbing 3-5 steps with a railing? : A Little 6 Click Score: 23    End of Session Equipment Utilized During Treatment: Gait belt Activity Tolerance: Patient tolerated treatment well Patient left: in bed;with call bell/phone within reach;with bed alarm set Nurse Communication: Mobility status PT Visit Diagnosis: Unsteadiness on feet (R26.81);Muscle weakness (generalized) (M62.81);Difficulty in walking, not elsewhere classified (R26.2)     Time: 9563-8756 PT Time Calculation (min) (ACUTE ONLY): 14 min  Charges:  $Therapeutic Activity: 8-22 mins                     Lieutenant Diego PT, DPT 4:21 PM,01/01/22

## 2022-01-01 NOTE — Evaluation (Signed)
Speech Language Pathology Evaluation Patient Details Name: [name]. MRN: [id number] DOB: 29-Oct-1965 Today's Date: 01/01/2022 Time: 1030-1100 SLP Time Calculation (min) (ACUTE ONLY): 30 min  Problem List:  Patient Active Problem List   Diagnosis Date Noted   Thrombocytopenia ([place]) 01/01/2022   Generalized weakness 12/24/2021   Hyponatremia 76/28/3151   Toxic metabolic encephalopathy 76/16/0737   Aspiration pneumonia ([place]) 12/15/2021   RMSF [facility] spotted fever) 12/15/2021   Malnutrition of moderate degree 12/06/2021   Thyroid nodule 12/04/2021   Hypernatremia 12/04/2021   Stage 2 skin ulcer of sacral region ([place]) 12/04/2021   Diabetic ketoacidosis without coma associated with type 2 diabetes mellitus ([place])    Encephalopathy acute 12/01/2021   Type 2 diabetes mellitus with hyperlipidemia ([place]) 01/26/2021   Tachycardia 08/30/2020   Pressure injury of skin 01/02/2020   Dehydration    Rhabdomyolysis 01/01/2020   Recurrent falls 05/15/2019   Acute renal failure superimposed on stage 3a chronic kidney disease ([place]) 05/14/2019   Closed displaced fracture of lateral malleolus of left fibula 08/02/2017   BPH (benign prostatic hyperplasia) 07/24/2017   Hemorrhoid 05/27/2017   Shoulder arthritis 05/27/2017   Diabetic neuropathy ([place]) 05/27/2017   Degenerative disc disease, lumbar 02/04/2017   Constipation    History of seizures 09/20/2016   Dementia without behavioral disturbance ([place]) 09/20/2016   Acute lower UTI    Urinary retention    HLD (hyperlipidemia) 09/07/2016   GERD (gastroesophageal reflux disease) 09/07/2016   PTSD (post-traumatic stress disorder) 09/07/2016   Fall 09/07/2016   Chronic kidney disease    Anorexia nervosa, restricting type, in full remission, moderate 01/05/2016   Ataxia 10/14/2015   Hypokalemia 10/03/2015   Hemiparesis ([place])    Dyslipidemia 05/31/2014   Insomnia 11/11/2013   Degenerative joint disease (DJD) of hip 08/16/2013    Anemia of chronic disease 10/31/2012   Acute midline low back pain without sciatica 05/20/2011   Erectile dysfunction 02/17/2011   Constipation - functional 02/17/2011   Hypogonadism male 05/20/2010   Demoralization and apathy 05/20/2010   Schizoaffective disorder, bipolar type ([place]) 01/18/2010   Right shoulder pain 01/18/2010   Anxiety state 12/05/2006   Essential hypertension 12/05/2006   Past Medical History:  Past Medical History:  Diagnosis Date   ADD (attention deficit disorder)    Anxiety    Arthritis    right hip   Bipolar 1 disorder ([place])    Blood in urine    CKD (chronic kidney disease), stage III ([place])    Creatinine elevation    Dementia ([place])    "early onset" (08/04/2017)   Depression    bipolar [facility]   Diabetes mellitus without complication ([place])    Family history of anesthesia complication    pt is unsure , but pt father may have been difficult to arouse    HCAP (healthcare-associated pneumonia) 10/31/2012   History of kidney stones    Hypertension    Hypogonadism male    Liver fatty degeneration    Microscopic hematuria    hereditary s/p Urology eval   Neuromuscular disorder ([place])    feet neuropathy    Osteoarthritis of right hip 11/28/2011   2012 2015 s/p THR Severe  Dr Novella Olive     Pleural effusion 11/02/2012   Pneumonia 10-2012   Pneumonia, organism unspecified([place]) 11/02/2012   Polysubstance dependence, non-opioid, in remission ([place])    remote   Primary osteoarthritis of left hip 05/22/2015   PTSD (post-traumatic stress disorder)    SOCIAL ANXIETY DISORDER  Schizoaffective disorder ([place])    Substance abuse ([place])    Suicide attempt by multiple drug overdose ([place]) 01-22-2016   Grieving his cat's death 2015/07/30   Past Surgical History:  Past Surgical History:  Procedure Laterality Date   BACK SURGERY     CLOSED REDUCTION METACARPAL WITH PERCUTANEOUS PINNING Right    LUMBAR [place] SURGERY     TONSILLECTOMY     TOTAL HIP ARTHROPLASTY Right  08/16/2013   Procedure: TOTAL HIP ARTHROPLASTY ANTERIOR APPROACH;  Surgeon: Hessie Dibble, MD;  Location: [facility];  Service: Orthopedics;  Laterality: Right;   TOTAL HIP ARTHROPLASTY Left 05/22/2015   Procedure: TOTAL HIP ARTHROPLASTY ANTERIOR APPROACH;  Surgeon: Melrose Nakayama, MD;  Location: [facility];  Service: Orthopedics;  Laterality: Left;   HPI:  Pt is a 56 year old male who was admitted to [facility] on 12/01/2021 for altered mental status. Pt with long history of chronic psychotic disorder as well as dyslipidemia, hyperlipidemia, GERD, chronic kidney disease, BPH and type 2 diabetes mellitus with stage 4 chronic kidney disease, with long term current use of insulin. Pt received ST services for dysphagia while at [facility] including a Modified Barium Swallow Study on  12/18/2021 revealing silent aspiration of thin liquids. Diet recommendation was dysphagia 2 with nectar thick liquids and medicine crushed in puree. Pt transferred to [facility] on 12/20/2021 for ECT treatments. Last ECT treatment was on 12/30/2021.   Assessment / Plan / Recommendation Clinical Impression  At baseline, pt reports that he had a paid aid 2.5 hours 7 days a week (he felt she didn't need to come on Sat and Sun so he reduced her hours but he has the potential to resume 7 days a week at discharge). His sister organizes a pill organizer - 4 weeks at a time with his aid ensures that pt takes his medicines throughout the day. He was managing his own bills but his sister is doing that now and he would be ok with her help at discharge. He takes public transportation and orders his groceries thru [employer]. His aid helped him with bathing (he was concerned about falling), light cooking, light housework etc. His sister was checking at least 1xweek.       The Cognitive Linguistic Quick Test was administered (see scores below). Since pt resided alone, the environment was purposefully made more challenging by leaving the TV on and his door open at  the nursing station. Formally he presents with mild overall cognitive impairment that is c/b moderate memory deficits, moderate visospatial skills (appears commensurate with fear of falling) and functional executive functions. While pt demonstrated moderate memory deficits on this assessment, pt's functional memory as it pertains to him, his surroundings and home environment appear adequate. Given his description of support at baseline, it is my clinical opinion that he is functioning at his cognitive baseline prior to hospitalization.     Cognitive Linguistic Quick Test: AGE - 18 - 69   The Cognitive Linguistic Quick Test (CLQT) was administered to assess the relative status of five cognitive domains: attention, memory, language, executive functioning, and visuospatial skills. Scores from 10 tasks were used to estimate severity ratings (standardized for age groups 18-69 years and 70-89 years) for each domain, a clock drawing task, as well as an overall composite severity rating of cognition.       Task Score Criterion Cut Scores  Personal Facts 7/8 8  Symbol Cancellation 12/12 11  Confrontation Naming 10/10 10  Clock Drawing  11/13 12  Story Retelling 4/10 6  Symbol Trails 10/10 9  Generative Naming 6/9 5  Design Memory 4/6 5  [place]  6/8 7  Design Generation 5/13 6    Cognitive Domain Composite Score Severity Rating  Attention 183/215 WNL  Memory 119/185 Moderate  Executive Function 27/40 WNL  Language 27/37 Mild  Visuospatial Skills 27/105 Moderate  Clock Drawing  11/13 Mild  Composite Severity Rating  Mild             SLP Assessment  SLP Recommendation/Assessment: Patient does not need any further Speech Lanaguage Pathology Services SLP Visit Diagnosis: Cognitive communication deficit (R41.841)    Recommendations for follow up therapy are one component of a multi-disciplinary discharge planning process, led by the attending physician.  Recommendations may be updated based  on patient status, additional functional criteria and insurance authorization.    Follow Up Recommendations  No SLP follow up    Assistance Recommended at Discharge  Intermittent Supervision/Assistance  Functional Status Assessment Patient has not had a recent decline in their functional status  Frequency and Duration min 2x/week         SLP Evaluation Cognition  Overall Cognitive Status: No family/caregiver present to determine baseline cognitive functioning Arousal/Alertness: Awake/alert Orientation Level: Oriented X4 Attention: Selective Selective Attention: Appears intact Memory: Impaired Memory Impairment: Storage deficit;Retrieval deficit Awareness: Appears intact Problem Solving: Appears intact Safety/Judgment: Appears intact       Comprehension  Auditory Comprehension Overall Auditory Comprehension: Appears within functional limits for tasks assessed Visual Recognition/Discrimination Discrimination: Within Function Limits Reading Comprehension Reading Status: Within funtional limits    Expression Expression Primary Mode of Expression: Verbal Verbal Expression Overall Verbal Expression: Appears within functional limits for tasks assessed Written Expression Dominant Hand: Right Written Expression: Within Functional Limits   Oral / Motor  Oral Motor/Sensory Function Overall Oral Motor/Sensory Function: Within functional limits Motor Speech Overall Motor Speech: Appears within functional limits for tasks assessed            [name] B. Rutherford Nail, M.S., CCC-SLP, Mining engineer Certified Brain Injury [place]  [place] Office [phone] Ascom [phone] Fax [phone]

## 2022-01-01 NOTE — Progress Notes (Signed)
Progress Note   Patient: [name]. WUJ:[id number] DOB: 1965-06-23 DOA: 12/20/2021     12 DOS: the patient was seen and examined on 01/01/2022   Brief hospital course: 56 year old man with ADD, bipolar disorder, dementia, type 2 diabetes mellitus, CKD stage II, hypertension, neuropathy, schizoaffective disorder.  Patient was transferred from [facility] to [facility] to obtain ECT treatments.  Psychiatry will try to start ECT treatments on Wednesday or Friday.  Patient initially had [facility] for encephalopathy, DKA, acute kidney injury and rhabdomyolysis.  Patient was found to have [facility] spotted fever and aspiration pneumonia.  Patient completed doxycycline for [facility] spotted fever and IV Zosyn for aspiration pneumonia.  Antibiotics completed. ECT started on 12/25/2021, completed on 9/12.  Pending nursing home placement.  Assessment and Plan: * Toxic metabolic encephalopathy Patient completed a course of ECT treatments.  Mental status improved from admission.  Aspiration pneumonia ([place]) IV Zosyn completed 7-day course on 12/21/2021.  Speech therapy did another modified swallow evaluation on 01/01/2022 and advance his diet to thin liquids  RMSF [facility] spotted fever) Completed 5 days of doxycycline on 12/18/2021  Thrombocytopenia (HCC) Platelet count dropped to 133 on 12/31/2021.  Hold Lovenox.  Recheck CBC tomorrow.  SCDs and teds while in bed  Generalized weakness Patient improving with physical therapy.  Last physical therapy note still recommends rehab  Hyponatremia Last sodium normal range.  Thyroid nodule Will need outpatient evaluation and ultrasound for this.  Type 2 diabetes mellitus with hyperlipidemia (HCC) Last hemoglobin A1c elevated at 8.0.  Patient on 35 units of Semglee insulin and sliding scale.  Holding statin with recent rhabdomyolysis.  Pressure injury of skin Present on admission.  Numerous areas of the body.   See full description below.  Acute renal failure superimposed on stage 3a chronic kidney disease ([place]) Acute kidney injury has resolved.  Patient has underlying chronic kidney disease stage II with a creatinine of 1.29.  (Creatinine was as high as 3.24 at [facility])  Dementia without behavioral disturbance [facility]) Listed in the problem list from back in 2019.  TSH normal range, B12 normal range and RPR nonreactive.  Anemia of chronic disease Hemoglobin stable.  Last hemoglobin 12.0.        Subjective: Patient feeling okay.  Wanting to get rehab started to get home as quickly as possible.  Feeling okay.  Offers no complaints.  Physical Exam: Vitals:   12/31/21 1936 01/01/22 0438 01/01/22 0500 01/01/22 0728  BP: 112/79 125/81  118/83  Pulse: 84 85  83  Resp: '17 16  15  '$ Temp: 98.5 F (36.9 C) 98.3 F (36.8 C)  98.6 F (37 C)  TempSrc:    Oral  SpO2: 98% 98%  97%  Weight:   80.7 kg   Height:       Physical Exam HENT:     Head: Normocephalic.     Mouth/Throat:     Pharynx: No oropharyngeal exudate.  Eyes:     General: Lids are normal.     Conjunctiva/sclera: Conjunctivae normal.  Cardiovascular:     Rate and Rhythm: Normal rate and regular rhythm.     Heart sounds: Normal heart sounds, S1 normal and S2 normal.  Pulmonary:     Breath sounds: Normal breath sounds. No decreased breath sounds, wheezing, rhonchi or rales.  Abdominal:     Palpations: Abdomen is soft.     Tenderness: There is no abdominal tenderness.  Musculoskeletal:  Right lower leg: No swelling.     Left lower leg: No swelling.  Skin:    General: Skin is warm.     Findings: No rash.  Neurological:     Mental Status: He is alert.     Comments: Follow simple commands.  Able to straight leg raise.  Able to hold a better conversation than the last week that I saw him.     Data Reviewed: Hemoglobin 12, platelet count 133  Family Communication: Spoke with sister on the  phone  Disposition: Status is: Inpatient Remains inpatient appropriate because: TOC looking into rehabs.  So far we do not have the bed offer.  If patient were to return home sister asking if we can increase the hours on the aide  Planned Discharge Destination: Patient's sister hoping for rehab.    Time spent: 28 minutes  Author: Loletha Grayer, MD 01/01/2022 3:08 PM  For on call review [email or web address].

## 2022-01-01 NOTE — Progress Notes (Signed)
Speech Language Pathology Treatment: Cognitive-Linquistic;Dysphagia  Patient Details Name: [name]. MRN: [id number] DOB: 09/16/1965 Today's Date: 01/01/2022 Time: 8441-7127 SLP Time Calculation (min) (ACUTE ONLY): 30 min  Assessment / Plan / Recommendation Clinical Impression  Pt seen for ongoing diet management and to complete cognitive training.   As this writer walked into room, pt was drinking water from a straw. Pt with immediate cough. However, when drinking from a cup, pt was free of overt s/s of aspiration "that was much easier." Education provided on taking cup sips and not using a straw. In addition, pt's lunch tray was present and he stated he "was not able to eat any of it." Pt states that he is unable to chew the hospital based regular diet without his upper dentures. However he reports being able to consume outside food such a [employer] pizza. Pt's nurse walked in during session and is also in favor downgrading his diet to dysphagia 2. Order placed and order provided permission for pt's family to bring in regular foods per pt's preference).   SLP further facilitated session by providing the results of pt's cognitive assessments. Pt voiced understanding. Education also provided that these results will be shared with pt's team via secure chat for further discussion on developing a safe discharge plan. No further ST services are indicated at this time.    HPI HPI: Pt is a 56 year old male who was admitted to [facility] on 12/01/2021 for altered mental status. Pt with long history of chronic psychotic disorder as well as dyslipidemia, hyperlipidemia, GERD, chronic kidney disease, BPH and type 2 diabetes mellitus with stage 4 chronic kidney disease, with long term current use of insulin. Pt received ST services for dysphagia while at [facility] including a Modified Barium Swallow Study on  12/18/2021 revealing silent aspiration of thin liquids. Diet recommendation was dysphagia 2  with nectar thick liquids and medicine crushed in puree. Pt transferred to [facility] on 12/20/2021 for ECT treatments. Last ECT treatment was on 12/30/2021.      SLP Plan  All goals met  Patient does not need any further Speech Lanaguage Pathology Services   Recommendations for follow up therapy are one component of a multi-disciplinary discharge planning process, led by the attending physician.  Recommendations may be updated based on patient status, additional functional criteria and insurance authorization.    Recommendations  Diet recommendations: Dysphagia 2 (fine chop);Thin liquid Liquids provided via: Cup Medication Administration: Whole meds with puree Supervision: Patient able to self feed Compensations: Minimize environmental distractions;Slow rate;Small sips/bites Postural Changes and/or Swallow Maneuvers: Out of bed for meals                Oral Care Recommendations: Oral care BID Follow Up Recommendations:  (TBD) Assistance recommended at discharge: Intermittent Supervision/Assistance SLP Visit Diagnosis: Cognitive communication deficit (R41.841);Dysphagia, pharyngeal phase (R13.13) Plan: All goals met       Vendetta Pittinger B. Rutherford Nail, M.S., CCC-SLP, Mining engineer Certified Brain Injury [place]  [place] Office [phone] Ascom [phone] Fax [phone]

## 2022-01-02 LAB — CBC
HCT: 37.4 % — ABNORMAL LOW (ref 39.0–52.0)
Hemoglobin: 12.5 g/dL — ABNORMAL LOW (ref 13.0–17.0)
MCH: 28.7 pg (ref 26.0–34.0)
MCHC: 33.4 g/dL (ref 30.0–36.0)
MCV: 85.8 fL (ref 80.0–100.0)
Platelets: 141 10*3/uL — ABNORMAL LOW (ref 150–400)
RBC: 4.36 MIL/uL (ref 4.22–5.81)
RDW: 15 % (ref 11.5–15.5)
WBC: 3.7 10*3/uL — ABNORMAL LOW (ref 4.0–10.5)
nRBC: 0 % (ref 0.0–0.2)

## 2022-01-02 LAB — GLUCOSE, CAPILLARY
Glucose-Capillary: 105 mg/dL — ABNORMAL HIGH (ref 70–99)
Glucose-Capillary: 100 mg/dL — ABNORMAL HIGH (ref 70–99)
Glucose-Capillary: 132 mg/dL — ABNORMAL HIGH (ref 70–99)
Glucose-Capillary: 117 mg/dL — ABNORMAL HIGH (ref 70–99)

## 2022-01-02 MED ORDER — ARIPIPRAZOLE 10 MG PO TABS: 10.0000 mg | ORAL_TABLET | Freq: Every day | ORAL | 0 refills | Status: AC

## 2022-01-02 MED ORDER — INSULIN GLARGINE-YFGN 100 UNIT/ML ~~LOC~~ SOLN: 30.0000 [IU] | Freq: Every day | SUBCUTANEOUS | 11 refills | Status: DC

## 2022-01-02 MED ORDER — POLYVINYL ALCOHOL 1.4 % OP SOLN: 1.0000 [drp] | OPHTHALMIC | 0 refills | Status: DC | PRN

## 2022-01-02 MED ORDER — INSULIN GLARGINE-YFGN 100 UNIT/ML ~~LOC~~ SOLN
30.0000 [IU] | Freq: Every day | SUBCUTANEOUS | Status: DC
  Administered 2022-01-02: 30 [IU] via SUBCUTANEOUS
  Filled 2022-01-02 (×2): qty 0.3

## 2022-01-02 MED ORDER — MAGNESIUM OXIDE -MG SUPPLEMENT 400 (240 MG) MG PO TABS: 400.0000 mg | ORAL_TABLET | Freq: Every day | ORAL | 0 refills | Status: DC

## 2022-01-02 NOTE — Care Management Important Message (Signed)
Important Message  Patient Details  Name: [name]. MRN: [id number] Date of Birth: 1965-12-05   Medicare Important Message Given:  Yes     Loann Quill 01/02/2022, 11:41 AM

## 2022-01-02 NOTE — Progress Notes (Signed)
Physical Therapy Treatment Patient Details Name: [name]. MRN: [id number] DOB: Nov 30, 1965 Today's Date: 01/02/2022   History of Present Illness Pt is a 56 year old male who was admitted to [facility] on 12/01/2021 for altered mental status. Pt with long history of chronic psychotic disorder as well as dyslipidemia, hyperlipidemia, GERD, chronic kidney disease, BPH and type 2 diabetes mellitus with stage 4 chronic kidney disease, with long term current use of insulin. He was brought to [facility] where initially noted to be encephalopathic, DKA, AKI and rhabdomyolysis.  Slowly with fluid and insulin DKA, AKI and rhabdomyolysis resolved.  He was later also diagnosed with [facility] spotted fever for which she received 5 days of doxycycline and hospital course complicated by aspiration pneumonia requiring IV Zosyn.  His mentation was thought secondary to due to underlying psych issue as his MRI and EEG was negative.  Rest of the metabolic work-up was also negative. He transfered to [facility] on 12/20/2021 for ECT. He had been receiving PT/OT at [facility] progressed mobility to getting on B LE (crouched posture) x 5 with scoot pivot transfer to chair.    PT Comments    Continued progression in tolerance to AMB activity, twice for 123f. Worked on bHotel managerintervention. Pt eager to go home, open to [facility] Pt is aware of deficits and limitations, makes efforts to remain safe.  Makes progress toward goals of treatment.   Recommendations for follow up therapy are one component of a multi-disciplinary discharge planning process, led by the attending physician.  Recommendations may be updated based on patient status, additional functional criteria and insurance authorization.  Follow Up Recommendations  Home health PT Can patient physically be transported by private vehicle: Yes   Assistance Recommended at Discharge Intermittent Supervision/Assistance  Patient can return home with  the following Assistance with cooking/housework;Assist for transportation;Help with stairs or ramp for entrance;A little help with bathing/dressing/bathroom;A little help with walking and/or transfers   Equipment Recommendations  Rolling walker (2 wheels)    Recommendations for Other Services       Precautions / Restrictions Precautions Precautions: Fall Restrictions Weight Bearing Restrictions: No     Mobility  Bed Mobility Overal bed mobility: Modified Independent Bed Mobility: Sit to Supine, Supine to Sit     Supine to sit: Modified independent (Device/Increase time) Sit to supine: Modified independent (Device/Increase time)        Transfers Overall transfer level: Needs assistance Equipment used: Rolling walker (2 wheels) Transfers: Sit to/from Stand Sit to Stand: Modified independent (Device/Increase time)                Ambulation/Gait Ambulation/Gait assistance: Supervision Gait Distance (Feet): 190 Feet (1916fCCW; seated recovery x2 minutes 19079fW.) Assistive device: Rolling walker (2 wheels) Gait Pattern/deviations: Step-through pattern Gait velocity: 0.21m36m   General Gait Details: safe use of RW, awareness of limitations, safe practices   Stairs             Wheelchair Mobility    Modified Rankin (Stroke Patients Only)       Balance Overall balance assessment: Mild deficits observed, not formally tested, Modified Independent                                          Cognition Arousal/Alertness: Awake/alert Behavior During Therapy: WFL for tasks assessed/performed Overall Cognitive Status: Within Functional Limits  for tasks assessed                         Following Commands: Follows multi-step commands consistently                Exercises Other Exercises Other Exercises: 140f AMB x2 with seated recovery between Other Exercises: four square stepping balanace activity minGuard Assist, no  device    General Comments        Pertinent Vitals/Pain Pain Assessment Pain Assessment:  (legs hurt when walking for a long time)    Home Living                          Prior Function            PT Goals (current goals can now be found in the care plan section) Acute Rehab PT Goals Patient Stated Goal: go home PT Goal Formulation: With patient Time For Goal Achievement: 01/04/22 Potential to Achieve Goals: Good Progress towards PT goals: Progressing toward goals    Frequency    Min 2X/week      PT Plan Discharge plan needs to be updated    Co-evaluation              AM-PAC PT "6 Clicks" Mobility   Outcome Measure  Help needed turning from your back to your side while in a flat bed without using bedrails?: None Help needed moving from lying on your back to sitting on the side of a flat bed without using bedrails?: None Help needed moving to and from a bed to a chair (including a wheelchair)?: None Help needed standing up from a chair using your arms (e.g., wheelchair or bedside chair)?: None Help needed to walk in hospital room?: None Help needed climbing 3-5 steps with a railing? : A Little 6 Click Score: 23    End of Session Equipment Utilized During Treatment: Gait belt Activity Tolerance: Patient tolerated treatment well Patient left: in bed;with call bell/phone within reach;with bed alarm set Nurse Communication: Mobility status PT Visit Diagnosis: Unsteadiness on feet (R26.81);Muscle weakness (generalized) (M62.81);Difficulty in walking, not elsewhere classified (R26.2)     Time: [address]-0865PT Time Calculation (min) (ACUTE ONLY): 25 min  Charges:  $Therapeutic Activity: 23-37 mins                    2:00 PM, 01/02/22 AEtta Grandchild PT, DPT Physical Therapist - [facility] [phone]([place]    [name] 01/02/2022, 1:58 PM

## 2022-01-02 NOTE — Progress Notes (Addendum)
Progress Note   Patient: [name]. HXT:[id number] DOB: 1965-06-01 DOA: 12/20/2021     13 DOS: the patient was seen and examined on 01/02/2022   Brief hospital course: 56 year old man with ADD, bipolar disorder, dementia, type 2 diabetes mellitus, CKD stage II, hypertension, neuropathy, schizoaffective disorder.  Patient was transferred from [facility] to [place] [facility] to obtain ECT treatments.  Psychiatry will try to start ECT treatments on Wednesday or Friday.  Patient initially had [facility] for encephalopathy, DKA, acute kidney injury and rhabdomyolysis.  Patient was found to have [facility] spotted fever and aspiration pneumonia.  Patient completed doxycycline for [facility] spotted fever and IV Zosyn for aspiration pneumonia.  Antibiotics completed. ECT started on 12/25/2021, completed on 9/12.  Patient improved with the strengthening PT and OT now recommending home with home health.  The patient does live alone.  Patient's family unable to pick him up until 01/03/2022.  Assessment and Plan: * Toxic metabolic encephalopathy Patient completed a course of ECT treatments.  Mental status improved from admission.  Aspiration pneumonia ([place]) IV Zosyn completed 7-day course on 12/21/2021.  Speech therapy did another modified swallow evaluation on 01/01/2022 and advance his diet to thin liquids  RMSF [facility] spotted fever) Completed 5 days of doxycycline on 12/18/2021  Thrombocytopenia (HCC) Platelet count dropped to 133 on 12/31/2021.  Hold Lovenox.  Platelet count 141 today.  SCDs and teds while in bed.  Generalized weakness Patient improving with physical therapy.  Last physical therapy note still recommends rehab  Hyponatremia Last sodium normal range.  Thyroid nodule Will need outpatient evaluation and ultrasound for this.  Type 2 diabetes mellitus with hyperlipidemia (HCC) Last hemoglobin A1c elevated at 8.0.  Patient on 30 units of Semglee  insulin and sliding scale.  Holding statin with recent rhabdomyolysis.  Pressure injury of skin Present on admission.  Numerous areas of the body.  See full description below.  Acute renal failure superimposed on stage 3a chronic kidney disease ([place]) Acute kidney injury has resolved.  Patient has underlying chronic kidney disease stage II with a creatinine of 1.29.  (Creatinine was as high as 3.24 at [facility])  Dementia without behavioral disturbance [facility]) Listed in the problem list from back in 2019.  TSH normal range, B12 normal range and RPR nonreactive.  Anemia of chronic disease Hemoglobin stable.  Last hemoglobin 12.0.        Subjective: Patient feels well.  Walked around better yesterday afternoon with physical therapy.  They changed their recommendations to home with home health.  Patient feels well.  Felt stronger with walking with physical therapy yesterday.  Initially transferred over here for ECT treatments.  Physical Exam: Vitals:   01/01/22 2011 01/02/22 0500 01/02/22 0548 01/02/22 0827  BP: 135/85  123/82 122/87  Pulse: 85  78 86  Resp: '18  18 16  '$ Temp: (!) 97.4 F (36.3 C)  98.1 F (36.7 C) 98.2 F (36.8 C)  TempSrc:    Oral  SpO2: 98%  98% 97%  Weight:  83.6 kg    Height:       Physical Exam HENT:     Head: Normocephalic.     Mouth/Throat:     Pharynx: No oropharyngeal exudate.  Eyes:     General: Lids are normal.     Conjunctiva/sclera: Conjunctivae normal.  Cardiovascular:     Rate and Rhythm: Normal rate and regular rhythm.     Heart sounds: Normal heart sounds,  S1 normal and S2 normal.  Pulmonary:     Breath sounds: Normal breath sounds. No decreased breath sounds, wheezing, rhonchi or rales.  Abdominal:     Palpations: Abdomen is soft.     Tenderness: There is no abdominal tenderness.  Musculoskeletal:     Right lower leg: No swelling.     Left lower leg: No swelling.  Skin:    General: Skin is warm.     Findings: No rash.   Neurological:     Mental Status: He is alert.     Comments: Straight leg raises without a problem.     Data Reviewed: Last for sugars 121, 96, 105 and 100  Family Communication: Spoke with patient's sister on the phone.  Unable to pick him up today but will be able to pick them up tomorrow evening.  Patient's sister is asking for a form 3051 to be faxed [facility] to [facility] to try to get extra hours with the aide.  Disposition: Status is: Inpatient Remains inpatient appropriate because: We will set up home health.  Medically ready to discharge since patient lives alone and still trying to set up more time with home health and daughter unable to pick up today to bring home. Planned Discharge Destination: Home with home health    Time spent: 27 minutes  Author: Loletha Grayer, MD 01/02/2022 1:40 PM  For on call review [email or web address].

## 2022-01-03 LAB — GLUCOSE, CAPILLARY
Glucose-Capillary: 115 mg/dL — ABNORMAL HIGH (ref 70–99)
Glucose-Capillary: 113 mg/dL — ABNORMAL HIGH (ref 70–99)

## 2022-01-03 LAB — BASIC METABOLIC PANEL
Anion gap: 7 (ref 5–15)
BUN: 17 mg/dL (ref 6–20)
CO2: 23 mmol/L (ref 22–32)
Calcium: 9.2 mg/dL (ref 8.9–10.3)
Chloride: 110 mmol/L (ref 98–111)
Creatinine, Ser: 1.16 mg/dL (ref 0.61–1.24)
GFR, Estimated: >60 mL/min (ref >60)
Glucose, Bld: 108 mg/dL — ABNORMAL HIGH (ref 70–99)
Potassium: 3.6 mmol/L (ref 3.5–5.1)
Sodium: 140 mmol/L (ref 135–145)

## 2022-01-03 LAB — PLATELET COUNT: Platelets: 152 10*3/uL (ref 150–400)

## 2022-01-03 MED ORDER — INSULIN LISPRO 100 UNIT/ML IJ SOLN: INTRAMUSCULAR | 11 refills | Status: DC

## 2022-01-03 MED ORDER — INSULIN GLARGINE-YFGN 100 UNIT/ML ~~LOC~~ SOLN
24.0000 [IU] | Freq: Every day | SUBCUTANEOUS | Status: DC
  Administered 2022-01-03: 24 [IU] via SUBCUTANEOUS
  Filled 2022-01-03: qty 0.24

## 2022-01-03 MED ORDER — INSULIN GLARGINE 100 UNIT/ML ~~LOC~~ SOLN: 24.0000 [IU] | Freq: Every day | SUBCUTANEOUS | 11 refills | Status: DC

## 2022-01-03 MED ORDER — INSULIN GLARGINE-YFGN 100 UNIT/ML ~~LOC~~ SOLN: 28.0000 [IU] | Freq: Every day | SUBCUTANEOUS | 11 refills | Status: DC

## 2022-01-03 MED ORDER — INSULIN GLARGINE-YFGN 100 UNIT/ML ~~LOC~~ SOLN
28.0000 [IU] | Freq: Every day | SUBCUTANEOUS | Status: DC
  Filled 2022-01-03: qty 0.28

## 2022-01-03 NOTE — Progress Notes (Signed)
Occupational Therapy Treatment Patient Details Name: [name]. MRN: [id number] DOB: Aug 22, 1965 Today's Date: 01/03/2022   History of present illness Pt is a 56 year old male who was admitted to [facility] on 12/01/2021 for altered mental status. Pt with long history of chronic psychotic disorder as well as dyslipidemia, hyperlipidemia, GERD, chronic kidney disease, BPH and type 2 diabetes mellitus with stage 4 chronic kidney disease, with long term current use of insulin. He was brought to [facility] where initially noted to be encephalopathic, DKA, AKI and rhabdomyolysis.  Slowly with fluid and insulin DKA, AKI and rhabdomyolysis resolved.  He was later also diagnosed with [facility] spotted fever for which she received 5 days of doxycycline and hospital course complicated by aspiration pneumonia requiring IV Zosyn.  His mentation was thought secondary to due to underlying psych issue as his MRI and EEG was negative.  Rest of the metabolic work-up was also negative. He transfered to [facility] on 12/20/2021 for ECT. He had been receiving PT/OT at [facility] progressed mobility to getting on B LE (crouched posture) x 5 with scoot pivot transfer to chair.   OT comments  Chart reviewed, pt greeted in bed agreeable to OT tx session. Tx session targeted improving ADL/functional mobility tolerance to facilitate return to PLOF. Improvements noted in endurance with pt able to amb 240' with RW, no rest breaks required in addition to performing standing grooming tasks at sink level for approx 5 minutes. Improvements also noted in safety awareness with pt requiring intermittent vcs for RW technique/safety. Educated provided re: importance of progressing mobility and participation in ADL tasks following discharge. Pt repost he is in agreement. Pt is left as received, all needs met. OT will continue to follow acutely. Recommend HHOT following discharge.    Recommendations for follow up therapy are  one component of a multi-disciplinary discharge planning process, led by the attending physician.  Recommendations may be updated based on patient status, additional functional criteria and insurance authorization.    Follow Up Recommendations  Home health OT    Assistance Recommended at Discharge Intermittent Supervision/Assistance  Patient can return home with the following  Assistance with cooking/housework;Direct supervision/assist for medications management;Direct supervision/assist for financial management;Assist for transportation;Help with stairs or ramp for entrance   Equipment Recommendations  Other (comment) (2ww)    Recommendations for Other Services      Precautions / Restrictions Precautions Precautions: Fall Restrictions Weight Bearing Restrictions: No       Mobility Bed Mobility Overal bed mobility: Modified Independent Bed Mobility: Sit to Supine, Supine to Sit   Sidelying to sit: Modified independent (Device/Increase time) Supine to sit: Modified independent (Device/Increase time)          Transfers Overall transfer level: Needs assistance Equipment used: Rolling walker (2 wheels) Transfers: Sit to/from Stand Sit to Stand: Modified independent (Device/Increase time)                 Balance Overall balance assessment: Needs assistance Sitting-balance support: Feet supported Sitting balance-Leahy Scale: Good     Standing balance support: Reliant on assistive device for balance Standing balance-Leahy Scale: Good                             ADL either performed or assessed with clinical judgement   ADL Overall ADL's : Needs assistance/impaired     Grooming: Wash/dry hands;Wash/dry face;Standing;Supervision/safety;Cueing for safety Grooming Details (indicate cue type and reason):  sink level with RW, intermittent vcs             Lower Body Dressing: Supervision/safety;Sitting/lateral leans Lower Body Dressing Details  (indicate cue type and reason): shoes Toilet Transfer: Supervision/safety;Ambulation;Rolling walker (2 wheels);Cueing for safety Toilet Transfer Details (indicate cue type and reason): simulated, intermittent vcs         Functional mobility during ADLs: Supervision/safety;Rolling walker (2 wheels) (approx 240' with RW, one vc for safe RW use)      Extremity/Trunk Assessment              Vision       Perception     Praxis      Cognition Arousal/Alertness: Awake/alert Behavior During Therapy: WFL for tasks assessed/performed Overall Cognitive Status: Within Functional Limits for tasks assessed Area of Impairment: Safety/judgement                         Safety/Judgement: Decreased awareness of safety     General Comments: improved safety awareness as compared to previous tx session, however intermittent vcs required for safet RW technique        Exercises Other Exercises Other Exercises: edu re importance of continued mobility following discharge    Shoulder Instructions       General Comments      Pertinent Vitals/ Pain       Pain Assessment Pain Assessment: Faces Faces Pain Scale: Hurts little more Pain Location: lower back Pain Descriptors / Indicators: Aching Pain Intervention(s): Monitored during session, Repositioned  Home Living                                          Prior Functioning/Environment              Frequency  Min 2X/week        Progress Toward Goals  OT Goals(current goals can now be found in the care plan section)  Progress towards OT goals: Progressing toward goals     Plan Frequency remains appropriate;Discharge plan needs to be updated    Co-evaluation                 AM-PAC OT "6 Clicks" Daily Activity     Outcome Measure   Help from another person eating meals?: None Help from another person taking care of personal grooming?: None Help from another person toileting,  which includes using toliet, bedpan, or urinal?: None Help from another person bathing (including washing, rinsing, drying)?: A Little Help from another person to put on and taking off regular upper body clothing?: None Help from another person to put on and taking off regular lower body clothing?: None 6 Click Score: 23    End of Session Equipment Utilized During Treatment: Gait belt;Rolling walker (2 wheels)  OT Visit Diagnosis: Unsteadiness on feet (R26.81);Other abnormalities of gait and mobility (R26.89);Muscle weakness (generalized) (M62.81);Low vision, both eyes (H54.2);Other symptoms and signs involving cognitive function   Activity Tolerance Patient tolerated treatment well   Patient Left with call bell/phone within reach;in bed;with bed alarm set   Nurse Communication Mobility status        Time: 6378-5885 OT Time Calculation (min): 13 min  Charges: OT General Charges $OT Visit: 1 Visit OT Treatments $Self Care/Home Management : 8-22 mins  Shanon Payor, OTD OTR/L  01/03/22, 10:04 AM

## 2022-01-03 NOTE — Discharge Summary (Signed)
Physician Discharge Summary   Patient: [name]. MRN: [id number] DOB: 1965/05/05  Admit date:     12/20/2021  Discharge date: 01/03/22  Discharge Physician: Loletha Grayer   PCP: [name], MD   Recommendations at discharge:   Follow-up PCP 5 days Follow-up with your psychiatrist Home health  Discharge Diagnoses: Principal Problem:   Toxic metabolic encephalopathy Active Problems:   Aspiration pneumonia (HCC)   RMSF [facility] spotted fever)   Anemia of chronic disease   Dyslipidemia   HLD (hyperlipidemia)   GERD (gastroesophageal reflux disease)   Chronic kidney disease   Dementia without behavioral disturbance (HCC)   BPH (benign prostatic hyperplasia)   Acute renal failure superimposed on stage 3a chronic kidney disease (HCC)   Pressure injury of skin   Type 2 diabetes mellitus with hyperlipidemia (HCC)   Thyroid nodule   Hyponatremia   Generalized weakness   Thrombocytopenia [facility])    Hospital Course: 56 year old man with ADD, bipolar disorder, dementia, type 2 diabetes mellitus, CKD stage II, hypertension, neuropathy, schizoaffective disorder.  Patient was transferred from [facility] over to [facility] to obtain ECT treatments.  Psychiatry will try to start ECT treatments on Wednesday or Friday.  Patient initially had [facility] for encephalopathy, DKA, acute kidney injury and rhabdomyolysis.  Patient was found to have [facility] spotted fever and aspiration pneumonia.  Patient completed doxycycline for [facility] spotted fever and IV Zosyn for aspiration pneumonia.  Antibiotics completed. ECT started on 12/25/2021, completed on 9/12.  Patient improved with the strengthening PT and OT now recommending home with home health.  Patient's mental status is improved during hospital course also the patient does live alone.  Patient's family unable to pick him up until 01/03/2022.  Assessment and Plan: * Toxic metabolic  encephalopathy Patient completed a course of ECT treatments.  Mental status improved from admission.  Aspiration pneumonia ([place]) IV Zosyn completed 7-day course on 12/21/2021.  Speech therapy did another modified swallow evaluation on 01/01/2022 and advance his diet to thin liquids  RMSF [facility] spotted fever) Completed 5 days of doxycycline on 12/18/2021  Thrombocytopenia (HCC) Platelet count dropped to 133 on 12/31/2021.  Hold Lovenox.  Platelet count 141 on 01/02/2022.  Platelet count improved to 152 upon discharge.  Generalized weakness Patient improving with physical therapy.  Physical therapy recommending home with home health  Hyponatremia Last sodium normal range.  Thyroid nodule Will need outpatient evaluation and ultrasound for this.  Type 2 diabetes mellitus with hyperlipidemia (HCC) Last hemoglobin A1c elevated at 8.0.  Patient on 24 units of Semglee insulin and can go back on a short acting insulin prior to meals.  Holding statin with recent rhabdomyolysis.  Pressure injury of skin Present on admission.  Numerous areas of the body.  See full description below.  Acute renal failure superimposed on stage 3a chronic kidney disease ([place]) Acute kidney injury has resolved.  Patient has underlying chronic kidney disease stage II with a creatinine of 1.16.  (Creatinine was as high as 3.24 at [facility])  Dementia without behavioral disturbance [facility]) Listed in the problem list from back in 2019.  TSH normal range, B12 normal range and RPR nonreactive.  Anemia of chronic disease Hemoglobin stable.  Last hemoglobin 12.5.         Consultants: Psychiatry Procedures performed: ECT treatments Disposition: Home health Diet recommendation:  Dysphagia 2 diet with thin liquids carb modified heart healthy DISCHARGE MEDICATION: Allergies as of 01/03/2022  Reactions   Vicodin [hydrocodone-acetaminophen] Itching        Medication List     STOP taking  these medications    atorvastatin 10 MG tablet Commonly known as: LIPITOR   feeding supplement (NEPRO CARB STEADY) Liqd   fenofibrate 145 MG tablet Commonly known as: TRICOR   insulin glargine-yfgn 100 UNIT/ML injection Commonly known as: SEMGLEE   LORazepam 2 MG/ML injection Commonly known as: ATIVAN   naloxone 4 MG/0.1ML Liqd nasal spray kit Commonly known as: NARCAN   piperacillin-tazobactam 3.375 GM/50ML IVPB Commonly known as: ZOSYN       TAKE these medications    ARIPiprazole 10 MG tablet Commonly known as: ABILIFY Take 1 tablet (10 mg total) by mouth daily. What changed:  medication strength how much to take   B-D UF III MINI PEN NEEDLES 31G X 5 MM Misc Generic drug: Insulin Pen Needle USE 4 TIMES DAILY   FreeStyle Libre 2 Sensor Misc by Does not apply route.   FreeStyle [employer] 14 Day Sensor Misc Use as directed   [employer] Use as directed   glucose blood test strip Commonly known as: Accu-Chek Guide CHECK SUGAR FOUR TIMES DAILY   insulin glargine 100 UNIT/ML injection Commonly known as: LANTUS Inject 0.24 mLs (24 Units total) into the skin daily before breakfast. What changed: how much to take   insulin lispro 100 UNIT/ML injection Commonly known as: HUMALOG 3 units with meals.  Do not take if you dont eat What changed:  how much to take how to take this when to take this additional instructions   magnesium oxide 400 (240 Mg) MG tablet Commonly known as: MAG-OX Take 1 tablet (400 mg total) by mouth daily.   polyvinyl alcohol 1.4 % ophthalmic solution Commonly known as: LIQUIFILM TEARS Place 1 drop into both eyes as needed for dry eyes.               Durable Medical Equipment  (From admission, onward)           Start     Ordered   01/03/22 1203  For home use only DME Walker rolling  Once       Question Answer Comment  Walker: With [place] Wheels   Patient needs a walker to treat with the following  condition Unsteady gait when walking      01/03/22 1203   01/01/22 1615  For home use only DME Walker rolling  Once       Question Answer Comment  Walker: With [place] Wheels   Patient needs a walker to treat with the following condition Weakness generalized      01/01/22 1614   01/01/22 1615  For home use only DME Walker rolling  Once       Question Answer Comment  Walker: With [place]   Patient needs a walker to treat with the following condition Unsteady gait when walking      01/01/22 1614            Follow-up Information     [name], MD Follow up in 5 day(s).   Specialty: Family Medicine Contact information: [place] [id number] [phone]         your psychiatrist Follow up in 5 day(s).                 Discharge Exam: Filed Weights   01/01/22 0500 01/02/22 0500 01/03/22 0500  Weight: 80.7  kg 83.6 kg 87.3 kg   Physical Exam HENT:     Head: Normocephalic.     Mouth/Throat:     Pharynx: No oropharyngeal exudate.  Eyes:     General: Lids are normal.     Conjunctiva/sclera: Conjunctivae normal.  Cardiovascular:     Rate and Rhythm: Normal rate and regular rhythm.     Heart sounds: Normal heart sounds, S1 normal and S2 normal.  Pulmonary:     Breath sounds: Normal breath sounds. No decreased breath sounds, wheezing, rhonchi or rales.  Abdominal:     Palpations: Abdomen is soft.     Tenderness: There is no abdominal tenderness.  Musculoskeletal:     Right lower leg: No swelling.     Left lower leg: No swelling.  Skin:    General: Skin is warm.     Findings: No rash.  Neurological:     Mental Status: He is alert.     Comments: Straight leg raises without a problem.      Condition at discharge: stable  The results of significant diagnostics from this hospitalization (including imaging, microbiology, ancillary and laboratory) are listed below for reference.   Imaging Studies: DG Swallowing  Func-Speech Pathology  Result Date: 01/01/2022 Table formatting from the original result was not included. Objective Swallowing Evaluation: Type of Study: MBS-Modified Barium Swallow Study  Patient Details Name: [name]. MRN: [id number] Date of Birth: 07-Aug-1965 Today's Date: 01/01/2022 Time: SLP Start Time (ACUTE ONLY): 0800 -SLP Stop Time (ACUTE ONLY): 0830 SLP Time Calculation (min) (ACUTE ONLY): 30 min Past Medical History: Past Medical History: Diagnosis Date  ADD (attention deficit disorder)   Anxiety   Arthritis   right hip  Bipolar 1 disorder (HCC)   Blood in urine   CKD (chronic kidney disease), stage III (HCC)   Creatinine elevation   Dementia ([place])   "early onset" (08/04/2017)  Depression   bipolar [facility]  Diabetes mellitus without complication ([place])   Family history of anesthesia complication   pt is unsure , but pt father may have been difficult to arouse   HCAP (healthcare-associated pneumonia) 10/31/2012  History of kidney stones   Hypertension   Hypogonadism male   Liver fatty degeneration   Microscopic hematuria   hereditary s/p Urology eval  Neuromuscular disorder ([place])   feet neuropathy   Osteoarthritis of right hip 11/28/2011  2012 2015 s/p THR Severe  Dr Novella Olive    Pleural effusion 11/02/2012  Pneumonia 10-2012  Pneumonia, organism unspecified(486) 11/02/2012  Polysubstance dependence, non-opioid, in remission ([place])   remote  Primary osteoarthritis of left hip 05/22/2015  PTSD (post-traumatic stress disorder)   SOCIAL ANXIETY DISORDER   Schizoaffective disorder ([place])   Substance abuse ([place])   Suicide attempt by multiple drug overdose ([place]) Jan 06, 2016  Grieving his cat's death 2015-06-13 Past Surgical History: Past Surgical History: Procedure Laterality Date  BACK SURGERY    CLOSED REDUCTION METACARPAL WITH PERCUTANEOUS PINNING Right   LUMBAR [place]    TOTAL HIP ARTHROPLASTY Right 08/16/2013  Procedure: TOTAL HIP ARTHROPLASTY ANTERIOR APPROACH;  Surgeon: Hessie Dibble, MD;  Location: [place];  Service: Orthopedics;  Laterality: Right;  TOTAL HIP ARTHROPLASTY Left 05/22/2015  Procedure: TOTAL HIP ARTHROPLASTY ANTERIOR APPROACH;  Surgeon: Melrose Nakayama, MD;  Location: [place];  Service: Orthopedics;  Laterality: Left; HPI: Pt is a 56 year old male who was admitted to [facility] on 12/01/2021 for altered mental status. Pt with long history of  chronic psychotic disorder as well as dyslipidemia, hyperlipidemia, GERD, chronic kidney disease, BPH and type 2 diabetes mellitus with stage 4 chronic kidney disease, with long term current use of insulin. Pt received ST services for dysphagia while at [facility] including a Modified Barium Swallow Study on  12/18/2021 revealing silent aspiration of thin liquids. Diet recommendation was dysphagia 2 with nectar thick liquids and medicine crushed in puree. Pt transferred to [facility] on 12/20/2021 for ECT treatments. Last ECT treatment was on 12/30/2021.  Subjective: pt pleasant, recognizes this Probation officer, intermittently joking, appropriate questions, able to follow directions  Recommendations for follow up therapy are one component of a multi-disciplinary discharge planning process, led by the attending physician.  Recommendations may be updated based on patient status, additional functional criteria and insurance authorization. Assessment / Plan / Recommendation   01/01/2022   5:00 PM Clinical Impressions Clinical Impression Pt presents with much improved mentation and oropharyngeal abilities when compared to his previous MBSS on 12/18/2021.     Pt's current abilities are likely considered to be at baseline. Based on impairment, pt has likely been experiencing chronic dysphagia for an extended period of time. Pt presents with mild pharyngeal dysphagia. Osteophytes specifically at C3 and C4 impede complete epiglottic deflection. As a result, pt intermittently penetrates thin liquids via cup with 1 episode of trace silent aspiration across many trials.  When reviewing pt's chart, none of his chest imaging revealed any suspicion for aspiration. Given the chronic nature of pt's dysphagia and he ambulatory status, recommend returning pt to thin liquids via cup sips. SLP Visit Diagnosis Dysphagia, pharyngeal phase (R13.13) Impact on safety and function Mild aspiration risk     01/01/2022   5:00 PM Treatment Recommendations Treatment Recommendations Therapy as outlined in treatment plan below     01/01/2022   5:00 PM Prognosis Prognosis for Safe Diet Advancement Good   01/01/2022   5:00 PM Diet Recommendations SLP Diet Recommendations Regular solids;Thin liquid Liquid Administration via Cup Medication Administration Whole meds with liquid Compensations Minimize environmental distractions;Slow rate;Small sips/bites Postural Changes Remain semi-upright after after feeds/meals (Comment);Seated upright at 90 degrees     01/01/2022   5:00 PM Other Recommendations Oral Care Recommendations Oral care BID Follow Up Recommendations --   01/01/2022   5:00 PM Frequency and Duration  Speech Therapy Frequency (ACUTE ONLY) min 2x/week Treatment Duration 2 weeks     01/01/2022   4:53 PM Oral Phase Oral Phase [facility]    01/01/2022   5:00 PM Pharyngeal Phase Pharyngeal Phase Impaired    01/01/2022   5:00 PM Cervical Esophageal Phase  Cervical Esophageal Phase [facility] Happi Overton 01/01/2022, 5:06 PM                     DG Swallowing Func-Speech Pathology  Result Date: 12/18/2021 Table formatting from the original result was not included. Objective Swallowing Evaluation: Type of Study: MBS-Modified Barium Swallow Study  Patient Details Name: [name]. MRN: [id number] Date of Birth: 1965-08-20 Today's Date: 12/18/2021 Time: SLP Start Time (ACUTE ONLY): 0130 -SLP Stop Time (ACUTE ONLY): 0145 SLP Time Calculation (min) (ACUTE ONLY): 15 min Past Medical History: Past Medical History: Diagnosis Date  ADD (attention deficit disorder)   Anxiety   Arthritis   right hip  Bipolar 1 disorder (HCC)    Blood in urine   CKD (chronic kidney disease), stage III (HCC)   Creatinine elevation   Dementia ([place])   "early onset" (08/04/2017)  Depression   bipolar [facility]  Diabetes mellitus without complication ([place])   Family history of anesthesia complication   pt is unsure , but pt father may have been difficult to arouse   HCAP (healthcare-associated pneumonia) 10/31/2012  History of kidney stones   Hypertension   Hypogonadism male   Liver fatty degeneration   Microscopic hematuria   hereditary s/p Urology eval  Neuromuscular disorder ([place])   feet neuropathy   Osteoarthritis of right hip 11/28/2011  2012 2015 s/p THR Severe  Dr Novella Olive    Pleural effusion 11/02/2012  Pneumonia 10-2012  Pneumonia, organism unspecified(486) 11/02/2012  Polysubstance dependence, non-opioid, in remission ([place])   remote  Primary osteoarthritis of left hip 05/22/2015  PTSD (post-traumatic stress disorder)   SOCIAL ANXIETY DISORDER   Schizoaffective disorder ([place])   Substance abuse ([place])   Suicide attempt by multiple drug overdose ([place]) Jan 11, 2016  Grieving his cat's death Jun 18, 2015 Past Surgical History: Past Surgical History: Procedure Laterality Date  BACK SURGERY    CLOSED REDUCTION METACARPAL WITH PERCUTANEOUS PINNING Right   LUMBAR [place] HIP ARTHROPLASTY Right 08/16/2013  Procedure: TOTAL HIP ARTHROPLASTY ANTERIOR APPROACH;  Surgeon: Hessie Dibble, MD;  Location: [place];  Service: Orthopedics;  Laterality: Right;  TOTAL HIP ARTHROPLASTY Left 05/22/2015  Procedure: TOTAL HIP ARTHROPLASTY ANTERIOR APPROACH;  Surgeon: Melrose Nakayama, MD;  Location: [place];  Service: Orthopedics;  Laterality: Left; HPI: Pt is a 56 year old male who presented 8/13 after he was found down at home. EEg 8/16: suggestive of moderate to severe diffuse encephalopathy, most likely secondary to toxic-metabolic causes. Psychotropic withdrawal suspected. CT head 8/16 and MRI brain 8/13 negative for acute changes. PMH: bipolar 1, PTSD,  schizoaffective disorder, substance abuse, polypharmacy, DM2, CKD, hip osteoarthritis.  No data recorded  Recommendations for follow up therapy are one component of a multi-disciplinary discharge planning process, led by the attending physician.  Recommendations may be updated based on patient status, additional functional criteria and insurance authorization. Assessment / Plan / Recommendation   12/18/2021   3:50 PM Clinical Impressions Clinical Impression Pt demonstrates mild-moderate oropharyngeal dysphagia marked by decreased oral containment, cohesion and bolus control leading to lingual and sublingual residue and uncoordinated propulsion. Mastication of regular was prolonged with delayed transit. Swallow initiation was briefly delayed to the pyriform sinuses with reduced anterior hyoid movement, tongue base retraction leading to penetration of thin, nectar (PAS 5, 3) and aspiration with thin barium (PAS 8). Pt was awake and slow to follow commands. Thin liquid pyriform sinus residue was penetrated to vocal cords without awareness. He could not effectively produce a chin tuck strategy. Recommending nectar thick and Dys 2 texture, no straws, crush pills and full assist with meals. He may benefit from brief oral hold before attempting to transit bolus. SLP Visit Diagnosis Dysphagia, oropharyngeal phase (R13.12) Impact on safety and function Mild aspiration risk;Moderate aspiration risk     12/18/2021   3:50 PM Treatment Recommendations Treatment Recommendations Therapy as outlined in treatment plan below     12/18/2021   3:50 PM Prognosis Prognosis for Safe Diet Advancement Good Barriers to Reach Goals Cognitive deficits   12/18/2021   3:50 PM Diet Recommendations SLP Diet Recommendations Dysphagia 2 (Fine chop) solids;Nectar thick liquid Liquid Administration via Cup;No straw Medication Administration Crushed with puree Compensations Small sips/bites;Slow rate;Clear throat intermittently Postural Changes Seated  upright at 90 degrees     12/18/2021   3:50 PM Other Recommendations Oral Care Recommendations Oral care BID Follow Up Recommendations  Skilled nursing-short term rehab (<3 hours/day) Assistance recommended at discharge Frequent or constant Supervision/Assistance Functional Status Assessment Patient has had a recent decline in their functional status and demonstrates the ability to make significant improvements in function in a reasonable and predictable amount of time.   12/18/2021   3:50 PM Frequency and Duration  Speech Therapy Frequency (ACUTE ONLY) min 2x/week Treatment Duration 2 weeks     12/18/2021   3:50 PM Oral Phase Oral Phase Impaired Oral - Thin Teaspoon Decreased bolus cohesion Oral - Thin Cup Decreased bolus cohesion Oral - Regular Delayed oral transit    12/18/2021   3:50 PM Pharyngeal Phase Pharyngeal Phase Impaired Pharyngeal- Nectar Cup Delayed swallow initiation-vallecula;Pharyngeal residue - valleculae;Pharyngeal residue - pyriform;Penetration/Aspiration during swallow Pharyngeal Material enters airway, remains ABOVE vocal cords and not ejected out Pharyngeal- Nectar Straw Penetration/Aspiration during swallow;Pharyngeal residue - pyriform;Pharyngeal residue - valleculae;Reduced anterior laryngeal mobility Pharyngeal Material enters airway, CONTACTS cords and not ejected out Pharyngeal- Thin Teaspoon Pharyngeal residue - valleculae;Pharyngeal residue - pyriform Pharyngeal- Thin Cup Penetration/Aspiration during swallow;Pharyngeal residue - valleculae;Pharyngeal residue - pyriform;Delayed swallow initiation-pyriform sinuses Pharyngeal Material enters airway, CONTACTS cords and not ejected out;Material enters airway, passes BELOW cords without attempt by patient to eject out (silent aspiration) Pharyngeal- Regular Pharyngeal residue - valleculae    12/18/2021   3:50 PM Cervical Esophageal Phase  Cervical Esophageal Phase Darryll Capers [name] 12/18/2021, 4:26 PM                     DG CHEST PORT 1  VIEW  Result Date: 12/15/2021 CLINICAL DATA:  [id number].  Shortness of breath. EXAM: PORTABLE CHEST 1 VIEW COMPARISON:  12/11/2021. FINDINGS: 5:27 a.m. Feeding tube enters the stomach but the intragastric course is not filmed. There is a low inspiration, as before but today there is increased patchy consolidation in the left lower lung field compatible with pneumonia or aspiration. The remaining hypoexpanded lungs are generally clear. No pleural effusion is seen. The cardiomediastinal silhouette is normal. There are multilevel spinal bridging syndesmophytes. IMPRESSION: Increased opacity in the left lower lung field consistent with pneumonia or aspiration. Otherwise stable hypoinflated exam. Electronically Signed   By: Telford Nab M.D.   On: 12/15/2021 07:36   DG CHEST PORT 1 VIEW  Result Date: 12/11/2021 CLINICAL DATA:  [id number]; tachypnea EXAM: PORTABLE CHEST 1 VIEW COMPARISON:  December 08, 2021 FINDINGS: Again seen is the feeding tube which is traced up to the proximal body of the stomach. Distal end of the tube is not included in the image. Low lung volumes. There is some atelectasis seen at the left lung base without significant interval change. No consolidation, pleural effusion or vascular congestion. The visualized skeletal structures are unremarkable. IMPRESSION: Minor atelectasis at the left lung base, stable. Feeding tube appears stable. Electronically Signed   By: Frazier Richards M.D.   On: 12/11/2021 16:36   DG FL GUIDED LUMBAR PUNCTURE  Result Date: 12/09/2021 CLINICAL DATA:  Encephalopathy, fever. EXAM: DIAGNOSTIC LUMBAR PUNCTURE UNDER FLUOROSCOPIC GUIDANCE COMPARISON:  Head CT 12/09/2021. Lumbar spine CT 02/26/2021. Lumbar spine MRI 04/11/2021. FLUOROSCOPY: Fluoroscopy time: 30 seconds (22.90 mGy). PROCEDURE: Due to the patient's altered mental status, Narda Rutherford, NP obtained informed consent from the patient's sister and POA [name]) prior to the procedure. This process included a  discussion of the procedural risks. The patient has transitional lumbosacral anatomy. For the purposes of this dictation, a spinal numbering scheme is used such that the posterior spinal fusion construct is  present at the L4-L5 level. This spinal numbering scheme remains consistent with that utilized on the prior lumbar spine MRI of 04/11/2021. The patient was positioned prone on the fluoroscopy table. An appropriate skin entry site was determined under fluoroscopy and marked. The operator donned sterile gloves and a mask. The skin entry site was prepped with Betadine and draped in the usual sterile fashion. 1% lidocaine was used for local anesthesia. Subsequently under intermittent fluoroscopy, a 20-gauge spinal needle was advanced into the thecal sac at the L3-L4 level. There was spontaneous return of clear/colorless CSF within opening pressure of 14 cm water. 12 mL of CSF were obtained for laboratory studies. The inner stylet was placed back into the needle, and the needle was removed in its entirety. The patient tolerated the procedure well. No immediate post-procedure complication was apparent. The procedure was performed by Narda Rutherford, NP, and was supervised and interpreted by Dr. Kellie Simmering. IMPRESSION: Technically successful fluoroscopically-guided L3-L4 lumbar puncture. Opening pressure: 14 cm water. 12 mL of CSF obtained for laboratory studies. No immediate post-procedure complication. Electronically Signed   By: Kellie Simmering D.O.   On: 12/09/2021 17:10   CT HEAD WO CONTRAST (5MM)  Result Date: 12/09/2021 CLINICAL DATA:  Provided history: Mental status change, unknown cause. EXAM: CT HEAD WITHOUT CONTRAST TECHNIQUE: Contiguous axial images were obtained from the base of the skull through the vertex without intravenous contrast. RADIATION DOSE REDUCTION: This exam was performed according to the departmental dose-optimization program which includes automated exposure control, adjustment of the mA  and/or kV according to patient size and/or use of iterative reconstruction technique. COMPARISON:  Prior head CT examinations 12/04/2021 and earlier. Brain MRI 12/01/2021. Brain MRI 05/22/2021. FINDINGS: Brain: Mild-to-moderate generalized cerebral atrophy, advanced for age. There is no acute intracranial hemorrhage. No demarcated cortical infarct. No extra-axial fluid collection. No evidence of an intracranial mass. No midline shift. Vascular: No hyperdense vessel. Atherosclerotic calcifications. Skull: No fracture or aggressive osseous lesion. Sinuses/Orbits: No mass or acute finding within the imaged orbits. No significant paranasal sinus disease at the imaged levels. Other: Nonspecific 14 mm cutaneous/subcutaneous cystic appearing lesion within the left upper posterior neck (series 4, image 2). IMPRESSION: No evidence of acute intracranial abnormality. Mild-to-moderate generalized cerebral atrophy, advanced for age. Nonspecific 14 mm cutaneous/subcutaneous cystic appearing lesion within the left upper posterior neck. Direct visualization recommended. Electronically Signed   By: Kellie Simmering D.O.   On: 12/09/2021 13:08   DG CHEST PORT 1 VIEW  Result Date: 12/08/2021 CLINICAL DATA:  Abnormal lung sounds. EXAM: PORTABLE CHEST 1 VIEW COMPARISON:  12/04/2021 FINDINGS: Interval placement of enteric tube with tip over the stomach in the midline upper abdomen. Lungs are hypoinflated with minimal linear density over the left base likely atelectasis. No effusion. Lungs are otherwise clear. Cardiomediastinal silhouette and remainder of the exam is unchanged. IMPRESSION: 1.  Hypoinflation with minimal linear atelectasis left base. 2.  Enteric tube as described. Electronically Signed   By: Marin Olp M.D.   On: 12/08/2021 10:25   DG Abd Portable 1V  Result Date: 12/06/2021 CLINICAL DATA:  Nasogastric tube placement. EXAM: PORTABLE ABDOMEN - 1 VIEW COMPARISON:  None Available. FINDINGS: Enteric tube appears  adequately positioned in the stomach with tip directed towards the stomach pylorus/duodenal bulb. Visualized portion of the bowel gas pattern is nonobstructive. IMPRESSION: Enteric tube appears adequately positioned in the stomach with tip directed towards the stomach pylorus/duodenal bulb. Electronically Signed   By: Franki Cabot M.D.   On: 12/06/2021 15:38  Labs: CBC: Recent Labs  Lab 12/31/21 0607 01/02/22 0426 01/03/22 0557  WBC 4.1 3.7*  --   HGB 12.0* 12.5*  --   HCT 35.7* 37.4*  --   MCV 85.2 85.8  --   PLT 133* 141* 076   Basic Metabolic Panel: Recent Labs  Lab 12/31/21 0607 01/03/22 0557  NA 141 140  K 4.1 3.6  CL 109 110  CO2 25 23  GLUCOSE 117* 108*  BUN 18 17  CREATININE 1.29* 1.16  CALCIUM 9.4 9.2  MG 1.8  --   PHOS 3.7  --     CBG: Recent Labs  Lab 01/02/22 1226 01/02/22 1625 01/02/22 2137 01/03/22 0731 01/03/22 1128  GLUCAP 100* 132* 117* 115* 113*    Discharge time spent: greater than 30 minutes.  Signed: Loletha Grayer, MD [facility] 01/03/2022

## 2022-01-03 NOTE — Progress Notes (Signed)
Patient being discharged home. IV removed. All discharge paperwork was went over with patient. Patient going home POV with sister.

## 2022-01-03 NOTE — TOC Transition Note (Signed)
Transition of Care [facility]) - CM/SW Discharge Note   Patient Details  Name: [name]. MRN: [id number] Date of Birth: 1965-11-28  Transition of Care [facility]) CM/SW Contact:  Laurena Slimmer, RN Phone Number: 01/03/2022, 1:13 PM   Clinical Narrative:    Spoke with patient sister regarding HH and discharge today. Patient sister did not have a preference of agency. Advised  walker would be delivered to his room. Referral sent and accepted [facility] of [facility]. TOC signing off.          Patient Goals and CMS Choice        Discharge Placement                       Discharge Plan and Services                                     Social Determinants of Health (SDOH) Interventions     Readmission Risk Interventions    01/02/2020    4:09 PM  Readmission Risk Prevention Plan  Transportation Screening Complete  PCP or Specialist Appt within 3-5 Days Complete  HRI or [place] Complete  Social Work Consult for [place] Planning/Counseling Complete  Palliative Care Screening Not Applicable  Medication Review [employer]) Referral to Pharmacy

## 2022-01-06 ENCOUNTER — Telehealth: Payer: Self-pay

## 2022-01-06 NOTE — Patient Outreach (Signed)
  Care Coordination [facility] Note Transition Care Management Follow-up Telephone Call Date of discharge and from where: 01/03/2022 How have you been since you were released from the hospital? "Doing okay" Any questions or concerns? No  Items Reviewed: Did the pt receive and understand the discharge instructions provided? Yes  Medications obtained and verified?  Pt reports sister manages meds for him-sister currently at [place] for patient Other?  N/A Any new allergies since your discharge? No  Dietary orders reviewed? Yes Do you have support at home? Yes -sister and aide that comes daily for 2hrs through [place] and Equipment/Supplies: Were home health services ordered? yes If so, what is the name of the agency? [facility]  Has the agency set up a time to come to the patient's home? No-pt discharged over weekend-aware to follow up if he does not hear from them in the next 24hrs Were any new equipment or medical supplies ordered?  Yes: rollator What is the name of the medical supply agency? Doesn't know Were you able to get the supplies/equipment? Yes-delivered prior to discharge Do you have any questions related to the use of the equipment or supplies? No  Functional Questionnaire: (I = Independent and D = Dependent) ADLs: Assist  Bathing/Dressing- Assist  Meal Prep- Assist  Eating- Assist  Maintaining continence- I  Transferring/Ambulation- I  Managing Meds- Assist  Follow up appointments reviewed:  PCP Hospital f/u appt confirmed? No -pt states sister is calling MD office today as he came home over the weekend. [facility] f/u appt confirmed? Yes  Scheduled to see psychatrist on 10/24 @ 11am. Are transportation arrangements needed? No -sister accompanies him to all appts If their condition worsens, is the pt aware to call PCP or go to the Emergency Dept.? Yes Was the patient provided with contact information for the PCP's office or ED? Yes Was  to pt encouraged to call back with questions or concerns? Yes  SDOH assessments and interventions completed:   Yes  Care Coordination Interventions Activated:  Yes   Care Coordination Interventions:  PCP follow up appointment requested   Encounter Outcome:  Pt. Visit Completed    Enzo Montgomery, RN,BSN,CCM [place] Management Telephonic Care Management Coordinator Direct Phone: [phone] Toll Free: [phone] Fax: [phone]

## 2022-01-12 DIAGNOSIS — L89622 Pressure ulcer of left heel, stage 2: Secondary | ICD-10-CM | POA: Insufficient documentation

## 2022-01-17 ENCOUNTER — Other Ambulatory Visit: Payer: Self-pay | Admitting: Family Medicine

## 2022-01-17 DIAGNOSIS — E1159 Type 2 diabetes mellitus with other circulatory complications: Secondary | ICD-10-CM

## 2022-01-17 DIAGNOSIS — M5136 Other intervertebral disc degeneration, lumbar region: Secondary | ICD-10-CM

## 2022-01-17 DIAGNOSIS — E349 Endocrine disorder, unspecified: Secondary | ICD-10-CM

## 2022-01-17 DIAGNOSIS — E781 Pure hyperglyceridemia: Secondary | ICD-10-CM

## 2022-01-18 DIAGNOSIS — E1140 Type 2 diabetes mellitus with diabetic neuropathy, unspecified: Secondary | ICD-10-CM | POA: Diagnosis not present

## 2022-01-18 DIAGNOSIS — J690 Pneumonitis due to inhalation of food and vomit: Secondary | ICD-10-CM | POA: Diagnosis not present

## 2022-01-18 DIAGNOSIS — E1165 Type 2 diabetes mellitus with hyperglycemia: Secondary | ICD-10-CM | POA: Diagnosis not present

## 2022-01-18 DIAGNOSIS — I129 Hypertensive chronic kidney disease with stage 1 through stage 4 chronic kidney disease, or unspecified chronic kidney disease: Secondary | ICD-10-CM | POA: Diagnosis not present

## 2022-01-18 DIAGNOSIS — D631 Anemia in chronic kidney disease: Secondary | ICD-10-CM | POA: Diagnosis not present

## 2022-01-18 DIAGNOSIS — G9341 Metabolic encephalopathy: Secondary | ICD-10-CM | POA: Diagnosis not present

## 2022-01-18 DIAGNOSIS — E1122 Type 2 diabetes mellitus with diabetic chronic kidney disease: Secondary | ICD-10-CM | POA: Diagnosis not present

## 2022-01-18 DIAGNOSIS — L89622 Pressure ulcer of left heel, stage 2: Secondary | ICD-10-CM | POA: Diagnosis not present

## 2022-01-18 DIAGNOSIS — N1831 Chronic kidney disease, stage 3a: Secondary | ICD-10-CM | POA: Diagnosis not present

## 2022-01-18 DIAGNOSIS — E785 Hyperlipidemia, unspecified: Secondary | ICD-10-CM | POA: Diagnosis not present

## 2022-01-20 DIAGNOSIS — N1831 Chronic kidney disease, stage 3a: Secondary | ICD-10-CM | POA: Diagnosis not present

## 2022-01-20 DIAGNOSIS — J690 Pneumonitis due to inhalation of food and vomit: Secondary | ICD-10-CM | POA: Diagnosis not present

## 2022-01-20 DIAGNOSIS — E1165 Type 2 diabetes mellitus with hyperglycemia: Secondary | ICD-10-CM | POA: Diagnosis not present

## 2022-01-20 DIAGNOSIS — G9341 Metabolic encephalopathy: Secondary | ICD-10-CM | POA: Diagnosis not present

## 2022-01-20 DIAGNOSIS — I129 Hypertensive chronic kidney disease with stage 1 through stage 4 chronic kidney disease, or unspecified chronic kidney disease: Secondary | ICD-10-CM | POA: Diagnosis not present

## 2022-01-20 DIAGNOSIS — D631 Anemia in chronic kidney disease: Secondary | ICD-10-CM | POA: Diagnosis not present

## 2022-01-20 DIAGNOSIS — E1122 Type 2 diabetes mellitus with diabetic chronic kidney disease: Secondary | ICD-10-CM | POA: Diagnosis not present

## 2022-01-20 DIAGNOSIS — L89622 Pressure ulcer of left heel, stage 2: Secondary | ICD-10-CM | POA: Diagnosis not present

## 2022-01-20 DIAGNOSIS — E785 Hyperlipidemia, unspecified: Secondary | ICD-10-CM | POA: Diagnosis not present

## 2022-01-20 DIAGNOSIS — E1140 Type 2 diabetes mellitus with diabetic neuropathy, unspecified: Secondary | ICD-10-CM | POA: Diagnosis not present

## 2022-01-22 DIAGNOSIS — J690 Pneumonitis due to inhalation of food and vomit: Secondary | ICD-10-CM | POA: Diagnosis not present

## 2022-01-22 DIAGNOSIS — D631 Anemia in chronic kidney disease: Secondary | ICD-10-CM | POA: Diagnosis not present

## 2022-01-22 DIAGNOSIS — E1165 Type 2 diabetes mellitus with hyperglycemia: Secondary | ICD-10-CM | POA: Diagnosis not present

## 2022-01-22 DIAGNOSIS — E1140 Type 2 diabetes mellitus with diabetic neuropathy, unspecified: Secondary | ICD-10-CM | POA: Diagnosis not present

## 2022-01-22 DIAGNOSIS — E1122 Type 2 diabetes mellitus with diabetic chronic kidney disease: Secondary | ICD-10-CM | POA: Diagnosis not present

## 2022-01-22 DIAGNOSIS — G9341 Metabolic encephalopathy: Secondary | ICD-10-CM | POA: Diagnosis not present

## 2022-01-22 DIAGNOSIS — L89622 Pressure ulcer of left heel, stage 2: Secondary | ICD-10-CM | POA: Diagnosis not present

## 2022-01-22 DIAGNOSIS — E785 Hyperlipidemia, unspecified: Secondary | ICD-10-CM | POA: Diagnosis not present

## 2022-01-22 DIAGNOSIS — N1831 Chronic kidney disease, stage 3a: Secondary | ICD-10-CM | POA: Diagnosis not present

## 2022-01-22 DIAGNOSIS — I129 Hypertensive chronic kidney disease with stage 1 through stage 4 chronic kidney disease, or unspecified chronic kidney disease: Secondary | ICD-10-CM | POA: Diagnosis not present

## 2022-01-24 ENCOUNTER — Telehealth: Payer: Self-pay | Admitting: Emergency Medicine

## 2022-01-24 DIAGNOSIS — E1122 Type 2 diabetes mellitus with diabetic chronic kidney disease: Secondary | ICD-10-CM | POA: Diagnosis not present

## 2022-01-24 DIAGNOSIS — N1831 Chronic kidney disease, stage 3a: Secondary | ICD-10-CM | POA: Diagnosis not present

## 2022-01-24 DIAGNOSIS — I129 Hypertensive chronic kidney disease with stage 1 through stage 4 chronic kidney disease, or unspecified chronic kidney disease: Secondary | ICD-10-CM | POA: Diagnosis not present

## 2022-01-24 DIAGNOSIS — E1165 Type 2 diabetes mellitus with hyperglycemia: Secondary | ICD-10-CM | POA: Diagnosis not present

## 2022-01-24 DIAGNOSIS — G9341 Metabolic encephalopathy: Secondary | ICD-10-CM | POA: Diagnosis not present

## 2022-01-24 DIAGNOSIS — J690 Pneumonitis due to inhalation of food and vomit: Secondary | ICD-10-CM | POA: Diagnosis not present

## 2022-01-24 DIAGNOSIS — L89622 Pressure ulcer of left heel, stage 2: Secondary | ICD-10-CM | POA: Diagnosis not present

## 2022-01-24 DIAGNOSIS — E785 Hyperlipidemia, unspecified: Secondary | ICD-10-CM | POA: Diagnosis not present

## 2022-01-24 DIAGNOSIS — E1140 Type 2 diabetes mellitus with diabetic neuropathy, unspecified: Secondary | ICD-10-CM | POA: Diagnosis not present

## 2022-01-24 DIAGNOSIS — D631 Anemia in chronic kidney disease: Secondary | ICD-10-CM | POA: Diagnosis not present

## 2022-01-24 NOTE — Telephone Encounter (Signed)
Copied from [place] [id number]. Topic: General - Inquiry >> Jan 24, 2022 12:24 PM Marcellus Scott wrote: Reason for CRM: Randell Patient from [facility] Is the patient now stating that pt does not have any more wounds and will be discontinuing wound care. >> Jan 24, 2022 12:31 PM Marcellus Scott wrote: Randell Patient from [facility] Is with the patient now stating that pt does not have any more wounds and will be discontinuing wound care.

## 2022-01-26 DIAGNOSIS — E1165 Type 2 diabetes mellitus with hyperglycemia: Secondary | ICD-10-CM | POA: Diagnosis not present

## 2022-01-27 DIAGNOSIS — E1165 Type 2 diabetes mellitus with hyperglycemia: Secondary | ICD-10-CM | POA: Diagnosis not present

## 2022-01-27 DIAGNOSIS — E785 Hyperlipidemia, unspecified: Secondary | ICD-10-CM | POA: Diagnosis not present

## 2022-01-27 DIAGNOSIS — D631 Anemia in chronic kidney disease: Secondary | ICD-10-CM | POA: Diagnosis not present

## 2022-01-27 DIAGNOSIS — G9341 Metabolic encephalopathy: Secondary | ICD-10-CM | POA: Diagnosis not present

## 2022-01-27 DIAGNOSIS — N1831 Chronic kidney disease, stage 3a: Secondary | ICD-10-CM | POA: Diagnosis not present

## 2022-01-27 DIAGNOSIS — J690 Pneumonitis due to inhalation of food and vomit: Secondary | ICD-10-CM | POA: Diagnosis not present

## 2022-01-27 DIAGNOSIS — E1140 Type 2 diabetes mellitus with diabetic neuropathy, unspecified: Secondary | ICD-10-CM | POA: Diagnosis not present

## 2022-01-27 DIAGNOSIS — L89622 Pressure ulcer of left heel, stage 2: Secondary | ICD-10-CM | POA: Diagnosis not present

## 2022-01-27 DIAGNOSIS — E1122 Type 2 diabetes mellitus with diabetic chronic kidney disease: Secondary | ICD-10-CM | POA: Diagnosis not present

## 2022-01-27 DIAGNOSIS — I129 Hypertensive chronic kidney disease with stage 1 through stage 4 chronic kidney disease, or unspecified chronic kidney disease: Secondary | ICD-10-CM | POA: Diagnosis not present

## 2022-01-28 ENCOUNTER — Ambulatory Visit: Payer: Self-pay

## 2022-01-28 ENCOUNTER — Telehealth (HOSPITAL_BASED_OUTPATIENT_CLINIC_OR_DEPARTMENT_OTHER): Payer: Medicare Other | Admitting: Internal Medicine

## 2022-01-28 ENCOUNTER — Telehealth: Payer: Self-pay

## 2022-01-28 DIAGNOSIS — I129 Hypertensive chronic kidney disease with stage 1 through stage 4 chronic kidney disease, or unspecified chronic kidney disease: Secondary | ICD-10-CM | POA: Diagnosis not present

## 2022-01-28 DIAGNOSIS — L02811 Cutaneous abscess of head [any part, except face]: Secondary | ICD-10-CM | POA: Diagnosis not present

## 2022-01-28 DIAGNOSIS — E1140 Type 2 diabetes mellitus with diabetic neuropathy, unspecified: Secondary | ICD-10-CM | POA: Diagnosis not present

## 2022-01-28 DIAGNOSIS — J690 Pneumonitis due to inhalation of food and vomit: Secondary | ICD-10-CM | POA: Diagnosis not present

## 2022-01-28 DIAGNOSIS — E1165 Type 2 diabetes mellitus with hyperglycemia: Secondary | ICD-10-CM | POA: Diagnosis not present

## 2022-01-28 DIAGNOSIS — E785 Hyperlipidemia, unspecified: Secondary | ICD-10-CM | POA: Diagnosis not present

## 2022-01-28 DIAGNOSIS — N1831 Chronic kidney disease, stage 3a: Secondary | ICD-10-CM | POA: Diagnosis not present

## 2022-01-28 DIAGNOSIS — E1122 Type 2 diabetes mellitus with diabetic chronic kidney disease: Secondary | ICD-10-CM | POA: Diagnosis not present

## 2022-01-28 DIAGNOSIS — G9341 Metabolic encephalopathy: Secondary | ICD-10-CM | POA: Diagnosis not present

## 2022-01-28 DIAGNOSIS — D631 Anemia in chronic kidney disease: Secondary | ICD-10-CM | POA: Diagnosis not present

## 2022-01-28 DIAGNOSIS — L89622 Pressure ulcer of left heel, stage 2: Secondary | ICD-10-CM | POA: Diagnosis not present

## 2022-01-28 MED ORDER — SULFAMETHOXAZOLE-TRIMETHOPRIM 800-160 MG PO TABS: 1.0000 | ORAL_TABLET | Freq: Two times a day (BID) | ORAL | 0 refills | Status: DC

## 2022-01-28 NOTE — Progress Notes (Signed)
Virtual Visit via Video Note  I connected with [name]. on 01/28/2022 at 11:28 AM by a video enabled telemedicine application and verified that I am speaking with the correct person using two identifiers.  Location: Patient: home Provider: Office   I discussed the limitations of evaluation and management by telemedicine and the availability of in person appointments. The patient expressed understanding and agreed to proceed.  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  It has not bothered him until recently.  It was sore when he was in the hospital last month.  Several days ago it became swollen up.  When he laid down it busted and drained foul-smelling pus.  It is still draining.  It is a little less than a quarter in size.  Outpatient Encounter Medications as of 01/28/2022  Medication Sig   ARIPiprazole (ABILIFY) 10 MG tablet Take 1 tablet (10 mg total) by mouth daily.   Continuous Blood Gluc Receiver (FREESTYLE LIBRE READER) DEVI Use as directed   Continuous Blood Gluc Sensor (FREESTYLE LIBRE 14 DAY SENSOR) MISC Use as directed   Continuous Blood Gluc Sensor (FREESTYLE LIBRE 2 SENSOR) MISC by Does not apply route.   glucose blood (ACCU-CHEK GUIDE) test strip CHECK SUGAR FOUR TIMES DAILY   insulin glargine (LANTUS) 100 UNIT/ML injection Inject 0.24 mLs (24 Units total) into the skin daily before breakfast.   insulin lispro (HUMALOG) 100 UNIT/ML injection 3 units with meals.  Do not take if you dont eat   Insulin Pen Needle (B-D UF III MINI PEN NEEDLES) 31G X 5 MM MISC USE 4 TIMES DAILY   magnesium oxide (MAG-OX) 400 (240 Mg) MG tablet Take 1 tablet (400 mg total) by mouth daily.   polyvinyl alcohol (LIQUIFILM  TEARS) 1.4 % ophthalmic solution Place 1 drop into both eyes as needed for dry eyes.   No facility-administered encounter medications on file as of 01/28/2022.        Observations/Objective: 41 Caucasian male.  I had him turn the camera to the area of concern.  It appears to be a raised abscessed area with a central core on the posterior hairline left side.  Assessment and Plan: 1. Abscess, scalp Likely a cyst that has become infected. I told the patient that it is a good thing that it is draining because we do want to have the pus drained out.  We will put him on Bactrim for 7 days.  Advised that if the area gets larger in size and becomes painful, he should be seen in the emergency room for further I&D - sulfamethoxazole-trimethoprim (BACTRIM DS) 800-160 MG tablet; Take 1 tablet by mouth 2 (two) times daily.  Dispense: 14 tablet; Refill: 0   Follow Up Instructions: PRN   I discussed the assessment and treatment plan with the patient. The patient was provided an opportunity to ask questions and all were answered. The patient agreed with the plan and demonstrated an understanding of the instructions.   The patient was advised to call back or seek an in-person evaluation if the symptoms worsen or if the condition fails to improve as anticipated.  I spent 10 minutes dedicated to the care of this patient on the date of this encounter to include previsit review of  his chart including Dr. Smitty Pluck last note and recent hospital discharge summary, face-to-face time with patient discussing diagnosis and management and postvisit ordering of medication.  This note has been created with Surveyor, quantity. Any transcriptional errors are unintentional.  Karle Plumber, MD

## 2022-01-28 NOTE — Telephone Encounter (Signed)
Called to inform pt that pcp will join video visit soon. Due to finishing up w/ pt's in office. Pt expressed understanding. ---DD,RMA

## 2022-01-28 NOTE — Telephone Encounter (Signed)
  Chief Complaint: Opened draining boil on neck Symptoms: ibid Frequency: Had lump for a long time - years. Boil opened last night or night before Pertinent Negatives: Patient denies Fever Disposition: '[]'$ ED /'[]'$ Urgent Care (no appt availability in office) / '[x]'$ Appointment(In office/virtual)/ '[]'$  [facility]/ '[]'$ Home Care/ '[]'$ Refused Recommended Disposition /'[]'$ [facility]/ '[]'$  Follow-up with PCP Additional Notes: Received call from home health nurse Melissa. PT has had a lump on his neck for years. Lump opened yesterday or the day before and is draining pus. Pt has not put any otc antibiotic ointment on boil. Pt states it is painful when pushed on.   Reason for Disposition  Boil > 1/2 inch across (> 12 mm; larger than a marble)  Answer Assessment - Initial Assessment Questions 1. APPEARANCE of BOIL: "What does the boil look like?"      Size of a dime including redness 2. LOCATION: "Where is the boil located?"      Back of his neck 3. NUMBER: "How many boils are there?"      years 4. SIZE: "How big is the boil?" (e.g., inches, cm; compare to size of a coin or other object)     Size of a dime 5. ONSET: "When did the boil start?"     Opened up last night 6. PAIN: "Is there any pain?" If Yes, ask: "How bad is the pain?"   (Scale 1-10; or mild, moderate, severe)     When pushed pain is 8-9/10 7. FEVER: "Do you have a fever?" If Yes, ask: "What is it, how was it measured, and when did it start?"      no 8. SOURCE: "Have you been around anyone with boils or other Staph infections?" "Have you ever had boils before?"     Boils before no staph infections 9. OTHER SYMPTOMS: "Do you have any other symptoms?" (e.g., shaking chills, weakness, rash elsewhere on body)     no 10. PREGNANCY: "Is there any chance you are pregnant?" "When was your last menstrual period?"  Protocols used: Boil (Skin Abscess)-A-AH

## 2022-01-30 DIAGNOSIS — D631 Anemia in chronic kidney disease: Secondary | ICD-10-CM | POA: Diagnosis not present

## 2022-01-30 DIAGNOSIS — I129 Hypertensive chronic kidney disease with stage 1 through stage 4 chronic kidney disease, or unspecified chronic kidney disease: Secondary | ICD-10-CM | POA: Diagnosis not present

## 2022-01-30 DIAGNOSIS — L89622 Pressure ulcer of left heel, stage 2: Secondary | ICD-10-CM | POA: Diagnosis not present

## 2022-01-30 DIAGNOSIS — G9341 Metabolic encephalopathy: Secondary | ICD-10-CM | POA: Diagnosis not present

## 2022-01-30 DIAGNOSIS — E785 Hyperlipidemia, unspecified: Secondary | ICD-10-CM | POA: Diagnosis not present

## 2022-01-30 DIAGNOSIS — N1831 Chronic kidney disease, stage 3a: Secondary | ICD-10-CM | POA: Diagnosis not present

## 2022-01-30 DIAGNOSIS — J690 Pneumonitis due to inhalation of food and vomit: Secondary | ICD-10-CM | POA: Diagnosis not present

## 2022-01-30 DIAGNOSIS — E1122 Type 2 diabetes mellitus with diabetic chronic kidney disease: Secondary | ICD-10-CM | POA: Diagnosis not present

## 2022-01-30 DIAGNOSIS — E1140 Type 2 diabetes mellitus with diabetic neuropathy, unspecified: Secondary | ICD-10-CM | POA: Diagnosis not present

## 2022-01-30 DIAGNOSIS — E1165 Type 2 diabetes mellitus with hyperglycemia: Secondary | ICD-10-CM | POA: Diagnosis not present

## 2022-01-31 ENCOUNTER — Ambulatory Visit: Payer: Medicare Other | Attending: Family Medicine

## 2022-01-31 DIAGNOSIS — Z0000 Encounter for general adult medical examination without abnormal findings: Secondary | ICD-10-CM | POA: Diagnosis not present

## 2022-01-31 NOTE — Patient Instructions (Signed)
[name] , Thank you for taking time to come for your Medicare Wellness Visit. I appreciate your ongoing commitment to your health goals. Please review the following plan we discussed and let me know if I can assist you in the future.   These are the goals we discussed:  Goals   None     This is a list of the screening recommended for you and due dates:  Health Maintenance  Topic Date Due   Zoster (Shingles) Vaccine (1 of 2) Never done   COVID-19 Vaccine (4 - Mixed Product risk series) 05/30/2020   Flu Shot  11/19/2021   Complete foot exam   01/23/2022   Hemoglobin A1C  06/04/2022   Yearly kidney health urinalysis for diabetes  08/03/2022   Eye exam for diabetics  09/04/2022   Yearly kidney function blood test for diabetes  01/04/2023   Colon Cancer Screening  03/22/2023   Tetanus Vaccine  11/29/2024   Hepatitis C Screening: USPSTF Recommendation to screen - Ages 18-79 yo.  Completed   HIV Screening  Completed   HPV Vaccine  Aged Out  Health Maintenance, Male Adopting a healthy lifestyle and getting preventive care are important in promoting health and wellness. Ask your health care provider about: The right schedule for you to have regular tests and exams. Things you can do on your own to prevent diseases and keep yourself healthy. What should I know about diet, weight, and exercise? Eat a healthy diet  Eat a diet that includes plenty of vegetables, fruits, low-fat dairy products, and lean protein. Do not eat a lot of foods that are high in solid fats, added sugars, or sodium. Maintain a healthy weight Body mass index (BMI) is a measurement that can be used to identify possible weight problems. It estimates body fat based on height and weight. Your health care provider can help determine your BMI and help you achieve or maintain a healthy weight. Get regular exercise Get regular exercise. This is one of the most important things you can do for your health. Most adults  should: Exercise for at least 150 minutes each week. The exercise should increase your heart rate and make you sweat (moderate-intensity exercise). Do strengthening exercises at least twice a week. This is in addition to the moderate-intensity exercise. Spend less time sitting. Even light physical activity can be beneficial. Watch cholesterol and blood lipids Have your blood tested for lipids and cholesterol at 54 years of age, then have this test every 5 years. You may need to have your cholesterol levels checked more often if: Your lipid or cholesterol levels are high. You are older than 54 years of age. You are at high risk for heart disease. What should I know about cancer screening? Many types of cancers can be detected early and may often be prevented. Depending on your health history and family history, you may need to have cancer screening at various ages. This may include screening for: Colorectal cancer. Prostate cancer. Skin cancer. Lung cancer. What should I know about heart disease, diabetes, and high blood pressure? Blood pressure and heart disease High blood pressure causes heart disease and increases the risk of stroke. This is more likely to develop in people who have high blood pressure readings or are overweight. Talk with your health care provider about your target blood pressure readings. Have your blood pressure checked: Every 3-5 years if you are 77-69 years of age. Every year if you are 61 years old or older.  If you are between the ages of 19 and 9 and are a current or former smoker, ask your health care provider if you should have a one-time screening for abdominal aortic aneurysm (AAA). Diabetes Have regular diabetes screenings. This checks your fasting blood sugar level. Have the screening done: Once every three years after age 47 if you are at a normal weight and have a low risk for diabetes. More often and at a younger age if you are overweight or have a high  risk for diabetes. What should I know about preventing infection? Hepatitis B If you have a higher risk for hepatitis B, you should be screened for this virus. Talk with your health care provider to find out if you are at risk for hepatitis B infection. Hepatitis C Blood testing is recommended for: Everyone born from [birth year] through 1963. Anyone with known risk factors for hepatitis C. Sexually transmitted infections (STIs) You should be screened each year for STIs, including gonorrhea and chlamydia, if: You are sexually active and are younger than 54 years of age. You are older than 54 years of age and your health care provider tells you that you are at risk for this type of infection. Your sexual activity has changed since you were last screened, and you are at increased risk for chlamydia or gonorrhea. Ask your health care provider if you are at risk. Ask your health care provider about whether you are at high risk for HIV. Your health care provider may recommend a prescription medicine to help prevent HIV infection. If you choose to take medicine to prevent HIV, you should first get tested for HIV. You should then be tested every 3 months for as long as you are taking the medicine. Follow these instructions at home: Alcohol use Do not drink alcohol if your health care provider tells you not to drink. If you drink alcohol: Limit how much you have to 0-2 drinks a day. Know how much alcohol is in your drink. In the [place] one drink equals one 12 oz bottle of beer (355 mL), one 5 oz glass of wine (148 mL), or one 1 oz glass of hard liquor (44 mL). Lifestyle Do not use any products that contain nicotine or tobacco. These products include cigarettes, chewing tobacco, and vaping devices, such as e-cigarettes. If you need help quitting, ask your health care provider. Do not use street drugs. Do not share needles. Ask your health care provider for help if you need support or information about  quitting drugs. General instructions Schedule regular health, dental, and eye exams. Stay current with your vaccines. Tell your health care provider if: You often feel depressed. You have ever been abused or do not feel safe at home. Summary Adopting a healthy lifestyle and getting preventive care are important in promoting health and wellness. Follow your health care provider's instructions about healthy diet, exercising, and getting tested or screened for diseases. Follow your health care provider's instructions on monitoring your cholesterol and blood pressure. This information is not intended to replace advice given to you by your health care provider. Make sure you discuss any questions you have with your health care provider. Document Revised: 08/27/2020 Document Reviewed: 08/27/2020 Elsevier Patient Education  [place].

## 2022-01-31 NOTE — Progress Notes (Signed)
Subjective:   [name]. is a 56 y.o. male who presents for Medicare Annual/Subsequent preventive examination.  Review of Systems     I connected with [name] on 01/31/2022 at 12:00 pm by telephone and verified that I am speaking with the correct person using two identifiers. I discussed the limitations, risks, security and privacy concerns of performing an evaluation and management service by telephone and the availability of in person appointments. I also discussed with the patient that there may be a patient responsible charge related to this service. The patient expressed understanding and agreed to proceed.   Patient location: Home  My Location: [place] on the telephone call:  Myself and Patient     Cardiac Risk Factors include: none     Objective:    Today's Vitals   01/31/22 1204  PainSc: 5    There is no height or weight on file to calculate BMI.     01/31/2022   12:08 PM 12/27/2021    7:40 AM 12/02/2021    4:00 PM 12/01/2021    5:37 PM 06/16/2021   11:37 AM 06/13/2021    4:15 PM 02/26/2021    7:30 PM  Advanced Directives  Does Patient Have a Medical Advance Directive? Yes Unable to assess, patient is non-responsive or altered mental status Unable to assess, patient is non-responsive or altered mental status Unable to assess, patient is non-responsive or altered mental status No Yes No  Type of Advance Directive [place];Living will        Would patient like information on creating a medical advance directive?     No - Patient declined  No - Patient declined    Current Medications (verified) Outpatient Encounter Medications as of 01/31/2022  Medication Sig   ARIPiprazole (ABILIFY) 10 MG tablet Take 1 tablet (10 mg total) by mouth daily.   Continuous Blood Gluc Receiver (FREESTYLE LIBRE READER) DEVI Use as directed   Continuous Blood Gluc Sensor (FREESTYLE LIBRE 14 DAY SENSOR) MISC Use as directed    Continuous Blood Gluc Sensor (FREESTYLE LIBRE 2 SENSOR) MISC by Does not apply route.   glucose blood (ACCU-CHEK GUIDE) test strip CHECK SUGAR FOUR TIMES DAILY   insulin glargine (LANTUS) 100 UNIT/ML injection Inject 0.24 mLs (24 Units total) into the skin daily before breakfast.   insulin lispro (HUMALOG) 100 UNIT/ML injection 3 units with meals.  Do not take if you dont eat   Insulin Pen Needle (B-D UF III MINI PEN NEEDLES) 31G X 5 MM MISC USE 4 TIMES DAILY   magnesium oxide (MAG-OX) 400 (240 Mg) MG tablet Take 1 tablet (400 mg total) by mouth daily.   polyvinyl alcohol (LIQUIFILM TEARS) 1.4 % ophthalmic solution Place 1 drop into both eyes as needed for dry eyes.   sulfamethoxazole-trimethoprim (BACTRIM DS) 800-160 MG tablet Take 1 tablet by mouth 2 (two) times daily.   No facility-administered encounter medications on file as of 01/31/2022.    Allergies (verified) Vicodin [hydrocodone-acetaminophen]   History: Past Medical History:  Diagnosis Date   ADD (attention deficit disorder)    Anxiety    Arthritis    right hip   Bipolar 1 disorder (HCC)    Blood in urine    CKD (chronic kidney disease), stage III ([place])    Creatinine elevation    Dementia ([place])    "early onset" (08/04/2017)   Depression    bipolar [facility]   Diabetes mellitus without complication ([place])  Family history of anesthesia complication    pt is unsure , but pt father may have been difficult to arouse    HCAP (healthcare-associated pneumonia) 10/31/2012   History of kidney stones    Hypertension    Hypogonadism male    Liver fatty degeneration    Microscopic hematuria    hereditary s/p Urology eval   Neuromuscular disorder ([place])    feet neuropathy    Osteoarthritis of right hip 11/28/2011   2012 2015 s/p THR Severe  Dr Novella Olive     Pleural effusion 11/02/2012   Pneumonia 10-2012   Pneumonia, organism unspecified([place]) 11/02/2012   Polysubstance dependence, non-opioid, in remission ([place])    remote    Primary osteoarthritis of left hip 05/22/2015   PTSD (post-traumatic stress disorder)    SOCIAL ANXIETY DISORDER    Schizoaffective disorder ([place])    Substance abuse ([place])    Suicide attempt by multiple drug overdose ([place]) 01-05-16   Grieving his cat's death 2015-07-13   Past Surgical History:  Procedure Laterality Date   BACK SURGERY     CLOSED REDUCTION METACARPAL WITH PERCUTANEOUS PINNING Right    LUMBAR [place] HIP ARTHROPLASTY Right 08/16/2013   Procedure: TOTAL HIP ARTHROPLASTY ANTERIOR APPROACH;  Surgeon: Hessie Dibble, MD;  Location: [facility];  Service: Orthopedics;  Laterality: Right;   TOTAL HIP ARTHROPLASTY Left 05/22/2015   Procedure: TOTAL HIP ARTHROPLASTY ANTERIOR APPROACH;  Surgeon: Melrose Nakayama, MD;  Location: [facility];  Service: Orthopedics;  Laterality: Left;   Family History  Problem Relation Age of Onset   Diabetes Father    Cancer Mother        died of melanoma with mets   Cervical cancer Sister    Diabetes Sister    Other Neg Hx        hypogonadism   Colon cancer Neg Hx    Colon polyps Neg Hx    Esophageal cancer Neg Hx    Rectal cancer Neg Hx    Stomach cancer Neg Hx    Social History   Socioeconomic History   Marital status: Single    Spouse name: Not on file   Number of children: 0   Years of education: 14   Highest education level: Some college, no degree  Occupational History   Occupation: disability  Tobacco Use   Smoking status: Former    Years: 0.25    Types: Cigarettes    Quit date: 03/22/2019    Years since quitting: 2.8   Smokeless tobacco: Never  Vaping Use   Vaping Use: Never used  Substance and Sexual Activity   Alcohol use: Not Currently   Drug use: No    Comment: hx of marijuana/cocaine/crack use but sober since 07/13/22   Sexual activity: Not Currently  Other Topics Concern   Not on file  Social History Narrative   05/08/2021 lives alone, sister [name] helps with meds, he has some in home care, lived  with sister until Nov 2020   Caffeine- sodas, amount  varies   regular exercise-no   Social Determinants of Health   Financial Resource Strain: Low Risk  (01/31/2022)   Overall Financial Resource Strain (CARDIA)    Difficulty of Paying Living Expenses: Not very hard  Food Insecurity: No Food Insecurity (01/31/2022)   Hunger Vital Sign    Worried About Running Out of Food in the Last Year: Never true    Ran Out of Food in  the Last Year: Never true  Transportation Needs: No Transportation Needs (01/31/2022)   PRAPARE - Hydrologist (Medical): No    Lack of Transportation (Non-Medical): No  Physical Activity: Insufficiently Active (01/31/2022)   Exercise Vital Sign    Days of Exercise per Week: 7 days    Minutes of Exercise per Session: 20 min  Stress: No Stress Concern Present (01/31/2022)   [place]    Feeling of Stress : Not at all  Social Connections: Socially Isolated (01/31/2022)   Social Connection and Isolation Panel [NHANES]    Frequency of Communication with Friends and Family: More than three times a week    Frequency of Social Gatherings with Friends and Family: Never    Attends Religious Services: Never    Marine scientist or Organizations: No    Attends Music therapist: Never    Marital Status: Never married    Tobacco Counseling Counseling given: Not Answered   Clinical Intake:  Pre-visit preparation completed: No  Pain : 0-10 Pain Score: 5  Pain Location: Back Pain Orientation: Lower     Nutritional Risks: None Diabetes: Yes CBG done?: No  How often do you need to have someone help you when you read instructions, pamphlets, or other written materials from your doctor or pharmacy?: 1 - Never  Diabetic?Yes  Interpreter Needed?: No      Activities of Daily Living    01/31/2022   12:09 PM 01/30/2022   11:40 AM  In your present  state of health, do you have any difficulty performing the following activities:  Hearing? 0 0  Vision? 0 0  Difficulty concentrating or making decisions? 1 1  Walking or climbing stairs? 1 1  Dressing or bathing? 1 1  Doing errands, shopping? 1 0  Preparing Food and eating ? N N  Using the Toilet? N N  In the past six months, have you accidently leaked urine? Y Y  Do you have problems with loss of bowel control? N N  Managing your Medications? Y Y  Managing your Finances? Y N  Housekeeping or managing your Housekeeping? N Y    Patient Care Team: [name], MD as PCP - General (Family Medicine) Erline Levine, MD as Attending Physician (Neurosurgery)  Indicate any recent Medical Services you may have received from other than [facility] providers in the past year (date may be approximate).     Assessment:   This is a routine wellness examination for [place].  Hearing/Vision screen No results found.  Dietary issues and exercise activities discussed: Current Exercise Habits: Home exercise routine, Type of exercise: stretching, Time (Minutes): 20, Frequency (Times/Week): 7, Weekly Exercise (Minutes/Week): 140, Intensity: Mild, Exercise limited by: None identified   Goals Addressed   None   Depression Screen    01/31/2022   12:08 PM 11/19/2021    3:05 PM 08/28/2021   10:33 AM 06/13/2021    4:15 PM 05/27/2021    1:43 PM 02/06/2021    2:29 PM 11/19/2020    3:32 PM  PHQ 2/9 Scores  PHQ - 2 Score 0 '6 6 1 6 6 6  '$ PHQ- 9 Score  '24 23  23 21 25    '$ Fall Risk    01/31/2022   12:09 PM 01/30/2022   11:40 AM 11/19/2021    3:05 PM 08/28/2021   10:29 AM 06/13/2021    4:14 PM  Fall Risk   Falls  in the past year? 0 0 1 0 0  Number falls in past yr: 0 0 0 0   Injury with Fall? 0 0 0 0     FALL RISK PREVENTION PERTAINING TO THE HOME:  Any stairs in or around the home? No  If so, are there any without handrails? No  Home free of loose throw rugs in walkways, pet beds, electrical cords, etc?  Yes  Adequate lighting in your home to reduce risk of falls? Yes   ASSISTIVE DEVICES UTILIZED TO PREVENT FALLS:  Life alert? No  Use of a cane, walker or w/c? Yes  Grab bars in the bathroom? Yes  Shower chair or bench in shower? No  Elevated toilet seat or a handicapped toilet? No   TIMED UP AND GO:  Was the test performed? No .  Length of time to ambulate 10 feet: N/A sec.   Gait slow and steady with assistive device  Cognitive Function:    01/31/2022   12:10 PM 05/08/2021    2:37 PM 09/14/2019   11:29 AM  MMSE - Mini Mental State Exam  Orientation to time '5 3 5  '$ Orientation to Place '5 4 4  '$ Registration '3 3 3  '$ Attention/ Calculation 5 0 0  Recall '3 2 2  '$ Language- name 2 objects '2 2 2  '$ Language- repeat '1 1 1  '$ Language- follow 3 step command '3 3 3  '$ Language- read & follow direction '1 1 1  '$ Write a sentence '1 1 1  '$ Copy design 1 1 0  Total score '30 21 22      '$ 03/19/2017    1:20 PM  Montreal Cognitive Assessment   Visuospatial/ Executive (0/5) 3  Naming (0/3) 2  Attention: Read list of digits (0/2) 1  Attention: Read list of letters (0/1) 1  Attention: Serial 7 subtraction starting at 100 (0/3) 1  Language: Repeat phrase (0/2) 0  Language : Fluency (0/1) 0  Abstraction (0/2) 1  Delayed Recall (0/5) 1  Orientation (0/6) 6  Total 16  Adjusted Score (based on education) 16      01/31/2022   12:11 PM  6CIT Screen  What Year? 0 points  What month? 0 points  What time? 0 points  Count back from 20 0 points  Months in reverse 2 points  Repeat phrase 0 points  Total Score 2 points    Immunizations Immunization History  Administered Date(s) Administered   Influenza Split 02/17/2011, 04/04/2020   Influenza Whole 03/07/2004, 01/18/2010   Influenza, Seasonal, Injecte, Preservative Fre 06/10/2012   Influenza,inj,Quad PF,6+ Mos 06/13/2013, 01/01/2015, 01/30/2019, 05/27/2021   Influenza,inj,quad, With Preservative 01/09/2016   Influenza-Unspecified 01/04/2014,  01/10/2016   Moderna Sars-Covid-2 Vaccination 06/01/2019, 06/29/2019   PFIZER Comirnaty([place])Covid-19 Tri-Sucrose Vaccine 04/04/2020   Pneumococcal Conjugate-13 04/18/2015   Pneumococcal Polysaccharide-23 10/31/2012, 11/19/2020   Tdap 11/30/2014    TDAP status: Up to date  Flu Vaccine status: Due, Education has been provided regarding the importance of this vaccine. Advised may receive this vaccine at local pharmacy or Health Dept. Aware to provide a copy of the vaccination record if obtained from local pharmacy or Health Dept. Verbalized acceptance and understanding.  Pneumococcal vaccine status: Up to date  Covid-19 vaccine status: Completed vaccines  Qualifies for Shingles Vaccine? Yes   Zostavax completed No   Shingrix Completed?: No.    Education has been provided regarding the importance of this vaccine. Patient has been advised to call insurance company to determine out of pocket  expense if they have not yet received this vaccine. Advised may also receive vaccine at local pharmacy or Health Dept. Verbalized acceptance and understanding.  Screening Tests Health Maintenance  Topic Date Due   Zoster Vaccines- Shingrix (1 of 2) Never done   COVID-19 Vaccine (4 - Mixed Product risk series) 05/30/2020   INFLUENZA VACCINE  11/19/2021   FOOT EXAM  01/23/2022   HEMOGLOBIN A1C  06/04/2022   Diabetic kidney evaluation - Urine ACR  08/03/2022   OPHTHALMOLOGY EXAM  09/04/2022   Diabetic kidney evaluation - GFR measurement  01/04/2023   COLONOSCOPY (Pts 45-67yr Insurance coverage will need to be confirmed)  03/22/2023   TETANUS/TDAP  11/29/2024   Hepatitis C Screening  Completed   HIV Screening  Completed   HPV VACCINES  Aged Out    Health Maintenance  Health Maintenance Due  Topic Date Due   Zoster Vaccines- Shingrix (1 of 2) Never done   COVID-19 Vaccine (4 - Mixed Product risk series) 05/30/2020   INFLUENZA VACCINE  11/19/2021   FOOT EXAM  01/23/2022    Colorectal  cancer screening: Type of screening: Colonoscopy. Completed 03/21/2020. Repeat every 3 years  Lung Cancer Screening: (Low Dose CT Chest recommended if Age 54-80years, 30 pack-year currently smoking OR have quit w/in 15years.) does not qualify.   Lung Cancer Screening Referral: N/A  Additional Screening:  Hepatitis C Screening: does qualify; Completed 02/12/2015  Vision Screening: Recommended annual ophthalmology exams for early detection of glaucoma and other disorders of the eye. Is the patient up to date with their annual eye exam?  Yes  Who is the provider or what is the name of the [facility] which the patient attends annual [facility] exams[facility] provider, would they like to be referred to a provider to establish care? No .   Dental Screening: Recommended annual dental exams for proper oral hygiene  Community Resource Referral / Chronic Care Management: CRR required this visit?  No   CCM required this visit?  No      Plan:     I have personally reviewed and noted the following in the patient's chart:   Medical and social history Use of alcohol, tobacco or illicit drugs  Current medications and supplements including opioid prescriptions. Patient is not currently taking opioid prescriptions. Functional ability and status Nutritional status Physical activity Advanced directives List of other physicians Hospitalizations, surgeries, and ER visits in previous 12 months Vitals Screenings to include cognitive, depression, and falls Referrals and appointments  In addition, I have reviewed and discussed with patient certain preventive protocols, quality metrics, and best practice recommendations. A written personalized care plan for preventive services as well as general preventive health recommendations were provided to patient.     AGomez Cleverly CLee  01/31/2022   Nurse Notes: I spent 30 minutes on this telephone encounter   AVS mailed to patient

## 2022-02-03 ENCOUNTER — Encounter: Payer: Self-pay | Admitting: Podiatry

## 2022-02-03 ENCOUNTER — Ambulatory Visit (INDEPENDENT_AMBULATORY_CARE_PROVIDER_SITE_OTHER): Payer: Medicare Other | Admitting: Podiatry

## 2022-02-03 DIAGNOSIS — M2042 Other hammer toe(s) (acquired), left foot: Secondary | ICD-10-CM | POA: Diagnosis not present

## 2022-02-03 DIAGNOSIS — E1142 Type 2 diabetes mellitus with diabetic polyneuropathy: Secondary | ICD-10-CM | POA: Diagnosis not present

## 2022-02-03 DIAGNOSIS — J690 Pneumonitis due to inhalation of food and vomit: Secondary | ICD-10-CM | POA: Diagnosis not present

## 2022-02-03 DIAGNOSIS — G9341 Metabolic encephalopathy: Secondary | ICD-10-CM | POA: Diagnosis not present

## 2022-02-03 DIAGNOSIS — M2041 Other hammer toe(s) (acquired), right foot: Secondary | ICD-10-CM | POA: Diagnosis not present

## 2022-02-03 DIAGNOSIS — E1165 Type 2 diabetes mellitus with hyperglycemia: Secondary | ICD-10-CM | POA: Diagnosis not present

## 2022-02-03 DIAGNOSIS — E1140 Type 2 diabetes mellitus with diabetic neuropathy, unspecified: Secondary | ICD-10-CM | POA: Diagnosis not present

## 2022-02-03 DIAGNOSIS — L84 Corns and callosities: Secondary | ICD-10-CM

## 2022-02-03 DIAGNOSIS — E119 Type 2 diabetes mellitus without complications: Secondary | ICD-10-CM | POA: Diagnosis not present

## 2022-02-03 DIAGNOSIS — B351 Tinea unguium: Secondary | ICD-10-CM | POA: Diagnosis not present

## 2022-02-03 DIAGNOSIS — M79674 Pain in right toe(s): Secondary | ICD-10-CM

## 2022-02-03 DIAGNOSIS — E785 Hyperlipidemia, unspecified: Secondary | ICD-10-CM | POA: Diagnosis not present

## 2022-02-03 DIAGNOSIS — I129 Hypertensive chronic kidney disease with stage 1 through stage 4 chronic kidney disease, or unspecified chronic kidney disease: Secondary | ICD-10-CM | POA: Diagnosis not present

## 2022-02-03 DIAGNOSIS — L89600 Pressure ulcer of unspecified heel, unstageable: Secondary | ICD-10-CM | POA: Diagnosis not present

## 2022-02-03 DIAGNOSIS — L89622 Pressure ulcer of left heel, stage 2: Secondary | ICD-10-CM | POA: Diagnosis not present

## 2022-02-03 DIAGNOSIS — N1831 Chronic kidney disease, stage 3a: Secondary | ICD-10-CM | POA: Diagnosis not present

## 2022-02-03 DIAGNOSIS — M79675 Pain in left toe(s): Secondary | ICD-10-CM

## 2022-02-03 DIAGNOSIS — E1122 Type 2 diabetes mellitus with diabetic chronic kidney disease: Secondary | ICD-10-CM | POA: Diagnosis not present

## 2022-02-03 DIAGNOSIS — D631 Anemia in chronic kidney disease: Secondary | ICD-10-CM | POA: Diagnosis not present

## 2022-02-03 NOTE — Patient Instructions (Signed)
Pressure Injury  A pressure injury is damage to the skin and underlying tissue that results from pressure being applied to an area of the body. It often affects people who must spend a long time in a bed or chair because of a medical condition. Pressure injuries usually occur: Over bony parts of the body, such as the tailbone, shoulders, elbows, hips, heels, spine, ankles, and back of the head. Under medical devices that make contact with the body, such as respiratory equipment, stockings, tubes, and splints. Pressure injuries start as reddened areas on the skin and can lead to pain and an open wound. What are the causes? This condition is caused by frequent or constant pressure to an area of the body. Decreased blood flow to the skin can eventually cause the skin tissue to die and break down, causing a wound. What increases the risk? You are more likely to develop this condition if you: Are in the hospital or an extended care facility. Are bedridden or in a wheelchair. Have an injury or disease that keeps you from: Moving normally. Feeling pain or pressure. Have a condition that: Makes you sleepy or less alert. Causes poor blood flow. Need to wear a medical device. Have poor control of your bladder or bowel functions (incontinence). Have poor nutrition (malnutrition). If you are at risk for pressure injuries, your health care provider may recommend certain types of mattresses, mattress covers, pillows, cushions, or boots to help prevent them. These may include products filled with air, foam, gel, or sand. What are the signs or symptoms? Symptoms of this condition depend on the severity of the injury. Symptoms may include: Red or dark areas of the skin. Pain, warmth, or a change of skin texture. Blisters. An open wound. How is this diagnosed? This condition is diagnosed with a medical history and physical exam. You may also have tests, such as: Blood tests. Imaging tests. Blood flow  tests. Your pressure injury will be staged based on its severity. Staging is based on: The depth of the tissue injury, including whether there is exposure of muscle, bone, or tendon. The cause of the pressure injury. How is this treated? This condition may be treated by: Relieving or redistributing pressure on your skin. This includes: Frequently changing your position. Avoiding positions that caused the wound or that can make the wound worse. Using specific bed mattresses, chair cushions, or protective boots. Moving medical devices from an area of pressure, or placing padding between the skin and the device. Using foams, creams, or powders to prevent rubbing (friction) on the skin. Keeping your skin clean and dry. This may include using a skin cleanser or skin barrier as told by your health care provider. Cleaning your injury and removing any dead tissue from the wound (debridement). Placing a bandage (dressing) over your injury. Using medicines for pain or to prevent or treat infection. Surgery may be needed if other treatments are not working or if your injury is very deep. Follow these instructions at home: Wound care Follow instructions from your health care provider about how to take care of your wound. Make sure you: Wash your hands with soap and water before and after you change your bandage (dressing). If soap and water are not available, use hand sanitizer. Change your dressing as told by your health care provider.  Check your wound every day for signs of infection. Have a caregiver do this for you if you are not able. Check for: Redness, swelling, or increased pain. More  fluid or blood. Warmth. Pus or a bad smell. Skin care Keep your skin clean and dry. Gently pat your skin dry. Do not rub or massage your skin. You or a caregiver should check your skin every day for any changes in color or any new blisters or sores (ulcers). Medicines Take over-the-counter and prescription  medicines only as told by your health care provider. If you were prescribed an antibiotic medicine, take or apply it as told by your health care provider. Do not stop using the antibiotic even if your condition improves. Reducing and redistributing pressure Do not lie or sit in one position for a long time. Move or change position every 1-2 hours, or as told by your health care provider. Use pillows or cushions to reduce pressure. Ask your health care provider to recommend cushions or pads for you. General instructions  Eat a healthy diet that includes lots of protein. Drink enough fluid to keep your urine pale yellow. Be as active as you can every day. Ask your health care provider to suggest safe exercises or activities. Do not abuse drugs or alcohol. Do not use any products that contain nicotine or tobacco, such as cigarettes, e-cigarettes, and chewing tobacco. If you need help quitting, ask your health care provider. Keep all follow-up visits as told by your health care provider. This is important. Contact a health care provider if: You have: A fever or chills. Pain that is not helped by medicine. Any changes in skin color. New blisters or sores. Pus or a bad smell coming from your wound. Redness, swelling, or pain around your wound. More fluid or blood coming from your wound. Your wound does not improve after 1-2 weeks of treatment. Summary A pressure injury is damage to the skin and underlying tissue that results from pressure being applied to an area of the body. Do not lie or sit in one position for a long time. Your health care provider may advise you to move or change position every 1-2 hours. Follow instructions from your health care provider about how to take care of your wound. Keep all follow-up visits as told by your health care provider. This is important. This information is not intended to replace advice given to you by your health care provider. Make sure you discuss  any questions you have with your health care provider. Document Revised: 01/31/2021 Document Reviewed: 01/31/2021 Elsevier Patient Education  [place].

## 2022-02-06 DIAGNOSIS — G9341 Metabolic encephalopathy: Secondary | ICD-10-CM | POA: Diagnosis not present

## 2022-02-06 DIAGNOSIS — E1165 Type 2 diabetes mellitus with hyperglycemia: Secondary | ICD-10-CM | POA: Diagnosis not present

## 2022-02-06 DIAGNOSIS — I129 Hypertensive chronic kidney disease with stage 1 through stage 4 chronic kidney disease, or unspecified chronic kidney disease: Secondary | ICD-10-CM | POA: Diagnosis not present

## 2022-02-06 DIAGNOSIS — N1831 Chronic kidney disease, stage 3a: Secondary | ICD-10-CM | POA: Diagnosis not present

## 2022-02-06 DIAGNOSIS — E1122 Type 2 diabetes mellitus with diabetic chronic kidney disease: Secondary | ICD-10-CM | POA: Diagnosis not present

## 2022-02-06 DIAGNOSIS — D631 Anemia in chronic kidney disease: Secondary | ICD-10-CM | POA: Diagnosis not present

## 2022-02-06 DIAGNOSIS — L89622 Pressure ulcer of left heel, stage 2: Secondary | ICD-10-CM | POA: Diagnosis not present

## 2022-02-06 DIAGNOSIS — E1140 Type 2 diabetes mellitus with diabetic neuropathy, unspecified: Secondary | ICD-10-CM | POA: Diagnosis not present

## 2022-02-06 DIAGNOSIS — J690 Pneumonitis due to inhalation of food and vomit: Secondary | ICD-10-CM | POA: Diagnosis not present

## 2022-02-06 DIAGNOSIS — E785 Hyperlipidemia, unspecified: Secondary | ICD-10-CM | POA: Diagnosis not present

## 2022-02-06 NOTE — Progress Notes (Signed)
ANNUAL DIABETIC FOOT EXAM  Subjective: [name]. presents today for annual diabetic foot examination. Patient states he was hospitalized with [facility] Spotted Fever. He states he developed lesions on both heels which have since resolved. Areas are still tender and skin is dry and peeling.  Chief Complaint  Patient presents with   Nail Problem    Diabetic foot care [id number] A1C-Has not had one in awhile  PCP-Newlin,[place] PCP VS7-11/19/2021   Patient confirms h/o diabetes.  Patient denies any h/o foot wounds.  Patient has been diagnosed with neuropathy.  Risk factors: diabetes, diabetic neuropathy, neuropathy, HTN, CKD, hyperlipidemia, h/o tobacco use in remission.  [name], MD is patient's PCP. Last visit was November 20, 2021.  Past Medical History:  Diagnosis Date   ADD (attention deficit disorder)    Anxiety    Arthritis    right hip   Bipolar 1 disorder (HCC)    Blood in urine    CKD (chronic kidney disease), stage III ([place])    Creatinine elevation    Dementia ([place])    "early onset" (08/04/2017)   Depression    bipolar [facility]   Diabetes mellitus without complication ([place])    Family history of anesthesia complication    pt is unsure , but pt father may have been difficult to arouse    HCAP (healthcare-associated pneumonia) 10/31/2012   History of kidney stones    Hypertension    Hypogonadism male    Liver fatty degeneration    Microscopic hematuria    hereditary s/p Urology eval   Neuromuscular disorder ([place])    feet neuropathy    Osteoarthritis of right hip 11/28/2011   2012 2015 s/p THR Severe  Dr Novella Olive     Pleural effusion 11/02/2012   Pneumonia 10-2012   Pneumonia, organism unspecified([place]) 11/02/2012   Polysubstance dependence, non-opioid, in remission ([place])    remote   Primary osteoarthritis of left hip 05/22/2015   PTSD (post-traumatic stress disorder)    SOCIAL ANXIETY DISORDER    Schizoaffective disorder ([place])     Substance abuse ([place])    Suicide attempt by multiple drug overdose ([place]) 01-21-2016   Grieving his cat's death 07/29/15   Patient Active Problem List   Diagnosis Date Noted   Thrombocytopenia ([place]) 01/01/2022   Generalized weakness 12/24/2021   Hyponatremia 13/24/4010   Toxic metabolic encephalopathy 27/25/3664   Aspiration pneumonia ([place]) 12/15/2021   RMSF [facility] spotted fever) 12/15/2021   Malnutrition of moderate degree 12/06/2021   Thyroid nodule 12/04/2021   Hypernatremia 12/04/2021   Stage 2 skin ulcer of sacral region ([place]) 12/04/2021   Diabetic ketoacidosis without coma associated with type 2 diabetes mellitus ([place])    Encephalopathy acute 12/01/2021   Type 2 diabetes mellitus with hyperlipidemia ([place]) 01/26/2021   Tachycardia 08/30/2020   Pressure injury of skin 01/02/2020   Dehydration    Rhabdomyolysis 01/01/2020   Recurrent falls 05/15/2019   Acute renal failure superimposed on stage 3a chronic kidney disease ([place]) 05/14/2019   Closed displaced fracture of lateral malleolus of left fibula 08/02/2017   BPH (benign prostatic hyperplasia) 07/24/2017   Hemorrhoid 05/27/2017   Shoulder arthritis 05/27/2017   Diabetic neuropathy ([place]) 05/27/2017   Degenerative disc disease, lumbar 02/04/2017   Constipation    History of seizures 09/20/2016   Dementia without behavioral disturbance ([place]) 09/20/2016   Acute lower UTI    Urinary retention    HLD (hyperlipidemia) 09/07/2016   GERD (gastroesophageal reflux disease) 09/07/2016  PTSD (post-traumatic stress disorder) 09/07/2016   Fall 09/07/2016   Chronic kidney disease    Anorexia nervosa, restricting type, in full remission, moderate 01/05/2016   Ataxia 10/14/2015   Hypokalemia 10/03/2015   Hemiparesis ([place])    Dyslipidemia 05/31/2014   Insomnia 11/11/2013   Degenerative joint disease (DJD) of hip 08/16/2013   Anemia of chronic disease 10/31/2012   Acute midline low back pain without sciatica 05/20/2011    Erectile dysfunction 02/17/2011   Constipation - functional 02/17/2011   Hypogonadism male 05/20/2010   Demoralization and apathy 05/20/2010   Schizoaffective disorder, bipolar type ([place]) 01/18/2010   Right shoulder pain 01/18/2010   Anxiety state 12/05/2006   Essential hypertension 12/05/2006   Past Surgical History:  Procedure Laterality Date   BACK SURGERY     CLOSED REDUCTION METACARPAL WITH PERCUTANEOUS PINNING Right    LUMBAR [place] SURGERY     TONSILLECTOMY     TOTAL HIP ARTHROPLASTY Right 08/16/2013   Procedure: TOTAL HIP ARTHROPLASTY ANTERIOR APPROACH;  Surgeon: Hessie Dibble, MD;  Location: [facility];  Service: Orthopedics;  Laterality: Right;   TOTAL HIP ARTHROPLASTY Left 05/22/2015   Procedure: TOTAL HIP ARTHROPLASTY ANTERIOR APPROACH;  Surgeon: Melrose Nakayama, MD;  Location: [facility];  Service: Orthopedics;  Laterality: Left;   Current Outpatient Medications on File Prior to Visit  Medication Sig Dispense Refill   ARIPiprazole (ABILIFY) 10 MG tablet Take 1 tablet (10 mg total) by mouth daily. 30 tablet 0   Continuous Blood Gluc Receiver (FREESTYLE LIBRE READER) DEVI Use as directed 1 each each   Continuous Blood Gluc Sensor (FREESTYLE LIBRE 14 DAY SENSOR) MISC Use as directed 2 each 4   Continuous Blood Gluc Sensor (FREESTYLE LIBRE 2 SENSOR) MISC by Does not apply route.     fluticasone (FLONASE) 50 MCG/ACT nasal spray      glucose blood (ACCU-CHEK GUIDE) test strip CHECK SUGAR FOUR TIMES DAILY 100 each 12   insulin glargine (LANTUS) 100 UNIT/ML injection Inject 0.24 mLs (24 Units total) into the skin daily before breakfast. 10 mL 11   insulin lispro (HUMALOG) 100 UNIT/ML injection 3 units with meals.  Do not take if you dont eat 10 mL 11   Insulin Pen Needle (B-D UF III MINI PEN NEEDLES) 31G X 5 MM MISC USE 4 TIMES DAILY 120 each 12   magnesium oxide (MAG-OX) 400 (240 Mg) MG tablet Take 1 tablet (400 mg total) by mouth daily. 30 tablet 0   polyvinyl alcohol (LIQUIFILM TEARS) 1.4  % ophthalmic solution Place 1 drop into both eyes as needed for dry eyes. 15 mL 0   traZODone (DESYREL) 150 MG tablet Take 150 mg by mouth at bedtime.     No current facility-administered medications on file prior to visit.    Allergies  Allergen Reactions   Vicodin [Hydrocodone-Acetaminophen] Itching   Social History   Occupational History   Occupation: disability  Tobacco Use   Smoking status: Former    Years: 0.25    Types: Cigarettes    Quit date: 03/22/2019    Years since quitting: 2.8   Smokeless tobacco: Never  Vaping Use   Vaping Use: Never used  Substance and Sexual Activity   Alcohol use: Not Currently   Drug use: No    Comment: hx of marijuana/cocaine/crack use but sober since 20's   Sexual activity: Not Currently   Family History  Problem Relation Age of Onset   Diabetes Father    Cancer Mother  died of melanoma with mets   Cervical cancer Sister    Diabetes Sister    Other Neg Hx        hypogonadism   Colon cancer Neg Hx    Colon polyps Neg Hx    Esophageal cancer Neg Hx    Rectal cancer Neg Hx    Stomach cancer Neg Hx    Immunization History  Administered Date(s) Administered   Influenza Split 02/17/2011, 04/04/2020   Influenza Whole 03/07/2004, 01/18/2010   Influenza, Seasonal, Injecte, Preservative Fre 06/10/2012   Influenza,inj,Quad PF,6+ Mos 06/13/2013, 01/01/2015, 01/30/2019, 05/27/2021   Influenza,inj,quad, With Preservative 01/09/2016   Influenza-Unspecified 01/04/2014, 01/10/2016   Moderna Sars-Covid-2 Vaccination 06/01/2019, 06/29/2019   PFIZER Comirnaty([place])Covid-19 Tri-Sucrose Vaccine 04/04/2020   Pneumococcal Conjugate-13 04/18/2015   Pneumococcal Polysaccharide-23 10/31/2012, 11/19/2020   Tdap 11/30/2014     Review of Systems: Negative except as noted in the HPI.   Objective: There were no vitals filed for this visit.  [name]. is a pleasant 56 y.o. male in NAD. AAO X 3.  Vascular  Examination: Capillary refill time immediate b/l.Vascular status intact b/l with palpable pedal pulses. Pedal hair sparse b/l. No edema. No pain with calf compression b/l. Skin temperature gradient WNL b/l. No ischemia or gangrene noted b/l LE. No cyanosis or clubbing noted b/l LE.  Neurological Examination: Pt has subjective symptoms of neuropathy. Protective sensation diminished with 10g monofilament b/l.  Dermatological Examination: Resolving area of pressure noted posterolateral aspect of both heels b/l with tenderness to palpation. Skin peeling. Area with blanching erythema. No blister formation, no eschar.   Pedal skin thin and atrophic b/l LE. No open wounds b/l LE. No interdigital macerations noted b/l LE.   Toenails 1-5 b/l elongated, discolored, dystrophic, thickened, crumbly with subungual debris and tenderness to dorsal palpation.   Hyperkeratotic lesion(s) submet head 1 left foot.  No erythema, no edema, no drainage, no fluctuance.  Musculoskeletal Examination: Muscle strength 5/5 to all lower extremity muscle groups bilaterally. Hammertoe deformity noted 2-5 b/l. Utilizes rollator for ambulation assistance.  Radiographs: None  Last A1c:      Latest Ref Rng & Units 12/02/2021    8:04 AM 09/02/2021    1:53 PM 06/11/2021    3:28 PM 05/27/2021    2:26 PM  Hemoglobin A1C  Hemoglobin-A1c 4.8 - 5.6 % 8.0  9.4  7.8  7.1    Footwear Assessment: Does the patient wear appropriate shoes? Yes. Does the patient need inserts/orthotics? Yes.  ADA Risk Categorization:  High Risk  Patient has one or more of the following: Loss of protective sensation Absent pedal pulses Severe Foot deformity History of foot ulcer  Assessment: 1. Pain due to onychomycosis of toenails of both feet   2. Callus   3. Acquired hammertoes of both feet   4. Pressure injury of heel, unstageable, unspecified laterality ([place])   5. Diabetic peripheral neuropathy associated with type 2 diabetes mellitus  ([place])   6. Encounter for diabetic foot exam [facility])     Plan: -Patient was evaluated and treated. All patient's and/or POA's questions/concerns answered on today's visit. -Patient with recent h/o hospitalization/immobilization with resolving pressure injury to both heels. Appears to be resolving. Advised him to notify his sister to ask for hospital for heel protectors should he be admitted again in the future. He related understanding. -Medicaid ABN signed for services of paring of corn(s)/callus(es)/porokeratos(es) today. Copy in patient chart. -Continue diabetic foot care principles: inspect feet daily, monitor glucose as  recommended by PCP and/or Endocrinologist, and follow prescribed diet per PCP, Endocrinologist and/or dietician. -Patient to continue soft, supportive shoe gear daily. -Toenails 1-5 b/l were debrided in length and girth with sterile nail nippers and dremel without iatrogenic bleeding.  -Callus(es) submet head 1 left foot pared utilizing sterile scalpel blade without complication or incident. Total number debrided =1. -Patient/POA to call should there be question/concern in the interim. Return in about 3 months (around 05/06/2022).  Marzetta Board, DPM

## 2022-02-10 ENCOUNTER — Ambulatory Visit: Payer: Medicare Other | Attending: Internal Medicine | Admitting: Internal Medicine

## 2022-02-10 ENCOUNTER — Encounter: Payer: Self-pay | Admitting: Internal Medicine

## 2022-02-10 VITALS — BP 120/79 | HR 95 | Wt 192.8 lb

## 2022-02-10 DIAGNOSIS — Z23 Encounter for immunization: Secondary | ICD-10-CM | POA: Diagnosis not present

## 2022-02-10 DIAGNOSIS — E785 Hyperlipidemia, unspecified: Secondary | ICD-10-CM

## 2022-02-10 DIAGNOSIS — H04123 Dry eye syndrome of bilateral lacrimal glands: Secondary | ICD-10-CM | POA: Diagnosis not present

## 2022-02-10 DIAGNOSIS — D638 Anemia in other chronic diseases classified elsewhere: Secondary | ICD-10-CM | POA: Diagnosis not present

## 2022-02-10 DIAGNOSIS — E041 Nontoxic single thyroid nodule: Secondary | ICD-10-CM

## 2022-02-10 DIAGNOSIS — Z794 Long term (current) use of insulin: Secondary | ICD-10-CM

## 2022-02-10 DIAGNOSIS — E1122 Type 2 diabetes mellitus with diabetic chronic kidney disease: Secondary | ICD-10-CM | POA: Diagnosis not present

## 2022-02-10 DIAGNOSIS — N1831 Chronic kidney disease, stage 3a: Secondary | ICD-10-CM

## 2022-02-10 DIAGNOSIS — F250 Schizoaffective disorder, bipolar type: Secondary | ICD-10-CM

## 2022-02-10 DIAGNOSIS — E1169 Type 2 diabetes mellitus with other specified complication: Secondary | ICD-10-CM | POA: Diagnosis not present

## 2022-02-10 DIAGNOSIS — Z09 Encounter for follow-up examination after completed treatment for conditions other than malignant neoplasm: Secondary | ICD-10-CM | POA: Diagnosis not present

## 2022-02-10 LAB — GLUCOSE, POCT (MANUAL RESULT ENTRY): POC Glucose: 173 mg/dl — AB (ref 70–99)

## 2022-02-10 MED ORDER — ATORVASTATIN CALCIUM 10 MG PO TABS: 10.0000 mg | ORAL_TABLET | Freq: Every day | ORAL | 3 refills | Status: DC

## 2022-02-10 NOTE — Progress Notes (Signed)
Patient ID: [name]., male    DOB: January 07, 1966  MRN: [id number]  CC: Hospitalization Follow-up and Medication Refill   Subjective: [name] is a 56 y.o. male who presents for hosp f/u.  His sister [name], who is his Sheryle Hail, is with him.  PCP is Dr. Margarita Rana. 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  He was hospitalized with toxic metabolic encephalopathy in the setting of aspiration pneumonia/hypoxia, [facility] Spotted Fever, DKA and rhabdomyolysis with acute kidney injury.  He was treated with antibiotics. Patient was transferred to [facility] to get ECT treatments by psychiatry for catatonic state.  Received 2-3 treatments.  Found to have thyroid nodule incidentally on CT of the neck.  CAT scan of the neck revealed degenerative arthritis changes.  CT of the head revealed some atrophy but was otherwise negative.  Had pressure ulcers of the skin especially on the right side of the body.  His acute kidney injury had resolved by the time of hospital discharge.  Also has anemia of chronic disease.  Hemoglobin ranging from 11.4-12.5.  It was 12.5 at the time of discharge.  Also had mild low platelet count of 141 at the time of discharge.  Today: Since discharge home, patient reports he has been doing well.  Eating regular foods.  Has home physical therapy once a week and has a nurse who comes twice a week to check on pressure ulcers.  Pressure ulcers that were mainly on the right side of the body-elbow, ankle, heel-are resolving nicely.  Ambulates with rollator walker.  DM: Went back to taking Lantus 24 units in the morning and NovoLog 3 units with meals.  Has libre device.  Average blood sugar is 195 with morning range before  breakfast between 150-180.  In target range 39% of the times and high 46% of the times. -Was seeing Dr. Loanne Drilling.  Has seen a new endocrinologist 1 time in June since he left.  Plans to call and schedule follow-up appointment.  HL: Atorvastatin was held due to rhabdomyolysis.  Wanting to know whether to restart that.  He was also discharged on magnesium 400 mg once a day.  Took last dose today.  Wants to know whether to continue that.  Requests refill on artificial tears for dry eyes.  History of schizoaffective bipolar type: Currently only on Abilify.  Sister states he did well post ECT treatments.  Has appointment with his psychiatrist at [facility] tomorrow.  Thyroid nodule: Incidental finding on CT of the neck.  No prior history of thyroid disease.  Mother has thyroid disease.  HM agrees to flu vaccine.  Wants to hold on getting the shingles vaccine for now. Patient Active Problem List   Diagnosis Date Noted  . Thrombocytopenia ([place]) 01/01/2022  . Generalized weakness 12/24/2021  . Hyponatremia 12/23/2021  . Toxic metabolic encephalopathy 67/20/9470  . Aspiration pneumonia ([place]) 12/15/2021  . [facility] spotted fever) 12/15/2021  . Malnutrition of moderate degree 12/06/2021  . Thyroid nodule 12/04/2021  . Hypernatremia 12/04/2021  . Stage 2 skin ulcer of sacral region ([place]) 12/04/2021  . Diabetic ketoacidosis without coma associated with type 2 diabetes mellitus ([place])   . Encephalopathy acute 12/01/2021  . Type 2 diabetes mellitus with hyperlipidemia ([place]) 01/26/2021  .  Tachycardia 08/30/2020  . Pressure injury of skin 01/02/2020  . Dehydration   . Rhabdomyolysis 01/01/2020  . Recurrent falls 05/15/2019  . Acute renal failure superimposed on stage 3a chronic kidney disease ([place]) 05/14/2019  . Closed displaced fracture of lateral malleolus of left fibula 08/02/2017  . BPH (benign prostatic hyperplasia) 07/24/2017  . Hemorrhoid 05/27/2017  . Shoulder  arthritis 05/27/2017  . Diabetic neuropathy ([place]) 05/27/2017  . Degenerative disc disease, lumbar 02/04/2017  . Constipation   . History of seizures 09/20/2016  . Dementia without behavioral disturbance ([place]) 09/20/2016  . Acute lower UTI   . Urinary retention   . HLD (hyperlipidemia) 09/07/2016  . GERD (gastroesophageal reflux disease) 09/07/2016  . PTSD (post-traumatic stress disorder) 09/07/2016  . Fall 09/07/2016  . Chronic kidney disease   . Anorexia nervosa, restricting type, in full remission, moderate 01/05/2016  . Ataxia 10/14/2015  . Hypokalemia 10/03/2015  . Hemiparesis ([place])   . Dyslipidemia 05/31/2014  . Insomnia 11/11/2013  . Degenerative joint disease (DJD) of hip 08/16/2013  . Anemia of chronic disease 10/31/2012  . Acute midline low back pain without sciatica 05/20/2011  . Erectile dysfunction 02/17/2011  . Constipation - functional 02/17/2011  . Hypogonadism male 05/20/2010  . Demoralization and apathy 05/20/2010  . Schizoaffective disorder, bipolar type ([place]) 01/18/2010  . Right shoulder pain 01/18/2010  . Anxiety state 12/05/2006  . Essential hypertension 12/05/2006     Current Outpatient Medications on File Prior to Visit  Medication Sig Dispense Refill  . ARIPiprazole (ABILIFY) 10 MG tablet Take 1 tablet (10 mg total) by mouth daily. 30 tablet 0  . Continuous Blood Gluc Receiver (FREESTYLE LIBRE READER) DEVI Use as directed 1 each each  . Continuous Blood Gluc Sensor (FREESTYLE LIBRE 14 DAY SENSOR) MISC Use as directed 2 each 4  . Continuous Blood Gluc Sensor (FREESTYLE LIBRE 2 SENSOR) MISC by Does not apply route.    . fluticasone (FLONASE) 50 MCG/ACT nasal spray     . glucose blood (ACCU-CHEK GUIDE) test strip CHECK SUGAR FOUR TIMES DAILY 100 each 12  . insulin glargine (LANTUS) 100 UNIT/ML injection Inject 0.24 mLs (24 Units total) into the skin daily before breakfast. 10 mL 11  . insulin lispro (HUMALOG) 100 UNIT/ML injection 3 units with meals.  Do  not take if you dont eat 10 mL 11  . Insulin Pen Needle (B-D UF III MINI PEN NEEDLES) 31G X 5 MM MISC USE 4 TIMES DAILY 120 each 12  . magnesium oxide (MAG-OX) 400 (240 Mg) MG tablet Take 1 tablet (400 mg total) by mouth daily. 30 tablet 0  . polyvinyl alcohol (LIQUIFILM TEARS) 1.4 % ophthalmic solution Place 1 drop into both eyes as needed for dry eyes. 15 mL 0  . traZODone (DESYREL) 150 MG tablet Take 150 mg by mouth at bedtime.     No current facility-administered medications on file prior to visit.    Allergies  Allergen Reactions  . Vicodin [Hydrocodone-Acetaminophen] Itching    Social History   Socioeconomic History  . Marital status: Single    Spouse name: Not on file  . Number of children: 0  . Years of education: 43  . Highest education level: Some college, no degree  Occupational History  . Occupation: disability  Tobacco Use  . Smoking status: Former    Years: 0.25    Types: Cigarettes    Quit date: 03/22/2019    Years since quitting: 2.8  . Smokeless tobacco: Never  Vaping Use  .  Vaping Use: Never used  Substance and Sexual Activity  . Alcohol use: Not Currently  . Drug use: No    Comment: hx of marijuana/cocaine/crack use but sober since 20's  . Sexual activity: Not Currently  Other Topics Concern  . Not on file  Social History Narrative   05/08/2021 lives alone, sister [name] helps with meds, he has some in home care, lived with sister until Nov 2020   Caffeine- sodas, amount  varies   regular exercise-no   Social Determinants of Health   Financial Resource Strain: Low Risk  (01/31/2022)   Overall Financial Resource Strain (CARDIA)   . Difficulty of Paying Living Expenses: Not very hard  Food Insecurity: No Food Insecurity (01/31/2022)   Hunger Vital Sign   . Worried About Charity fundraiser in the Last Year: Never true   . Ran Out of Food in the Last Year: Never true  Transportation Needs: No Transportation Needs (01/31/2022)   PRAPARE -  Transportation   . Lack of Transportation (Medical): No   . Lack of Transportation (Non-Medical): No  Physical Activity: Insufficiently Active (01/31/2022)   Exercise Vital Sign   . Days of Exercise per Week: 7 days   . Minutes of Exercise per Session: 20 min  Stress: No Stress Concern Present (01/31/2022)   [place]   . Feeling of Stress : Not at all  Social Connections: Socially Isolated (01/31/2022)   Social Connection and Isolation Panel [NHANES]   . Frequency of Communication with Friends and Family: More than three times a week   . Frequency of Social Gatherings with Friends and Family: Never   . Attends Religious Services: Never   . Active Member of Clubs or Organizations: No   . Attends Archivist Meetings: Never   . Marital Status: Never married  Intimate Partner Violence: Not At Risk (01/31/2022)   Humiliation, Afraid, Rape, and Kick questionnaire   . Fear of Current or Ex-Partner: No   . Emotionally Abused: No   . Physically Abused: No   . Sexually Abused: No    Family History  Problem Relation Age of Onset  . Diabetes Father   . Cancer Mother        died of melanoma with mets  . Cervical cancer Sister   . Diabetes Sister   . Other Neg Hx        hypogonadism  . Colon cancer Neg Hx   . Colon polyps Neg Hx   . Esophageal cancer Neg Hx   . Rectal cancer Neg Hx   . Stomach cancer Neg Hx     Past Surgical History:  Procedure Laterality Date  . BACK SURGERY    . CLOSED REDUCTION METACARPAL WITH PERCUTANEOUS PINNING Right   . LUMBAR DISC SURGERY    . TONSILLECTOMY    . TOTAL HIP ARTHROPLASTY Right 08/16/2013   Procedure: TOTAL HIP ARTHROPLASTY ANTERIOR APPROACH;  Surgeon: Hessie Dibble, MD;  Location: [facility];  Service: Orthopedics;  Laterality: Right;  . TOTAL HIP ARTHROPLASTY Left 05/22/2015   Procedure: TOTAL HIP ARTHROPLASTY ANTERIOR APPROACH;  Surgeon: Melrose Nakayama, MD;   Location: [place];  Service: Orthopedics;  Laterality: Left;    ROS: Review of Systems Negative except as stated above  PHYSICAL EXAM: BP 120/79   Pulse 95   Wt 192 lb 12.8 oz (87.5 kg)   SpO2 92%   BMI 24.10 kg/m   Physical Exam  General appearance -  alert, well appearing, middle age 73 male and in no distress.  Patient is sitting on his rollator walker seat. Mental status - normal mood, behavior, speech, dress, motor activity, and thought processes Neck - supple, no significant adenopathy.  No thyroid enlargement or thyroid nodules appreciated. Chest - clear to auscultation, no wheezes, rales or rhonchi, symmetric air entry Heart - normal rate, regular rhythm, normal S1, S2, no murmurs, rubs, clicks or gallops Extremities - peripheral pulses normal, no pedal edema, no clubbing or cyanosis  Lab Results  Component Value Date   HGBA1C 8.0 (H) 12/02/2021       Latest Ref Rng & Units 01/03/2022    5:57 AM 12/31/2021    6:07 AM 12/24/2021    6:45 AM  CMP  Glucose 70 - 99 mg/dL 108  117  163   BUN 6 - 20 mg/dL '17  18  22   '$ Creatinine 0.61 - 1.24 mg/dL 1.16  1.29  1.27   Sodium 135 - 145 mmol/L 140  141  137   Potassium 3.5 - 5.1 mmol/L 3.6  4.1  3.6   Chloride 98 - 111 mmol/L 110  109  104   CO2 22 - 32 mmol/L '23  25  25   '$ Calcium 8.9 - 10.3 mg/dL 9.2  9.4  9.7    Lipid Panel     Component Value Date/Time   CHOL 167 07/06/2020 1116   TRIG 323 (H) 07/06/2020 1116   HDL 39 (L) 07/06/2020 1116   CHOLHDL 4.3 07/06/2020 1116   CHOLHDL 7 06/02/2016 0844   VLDL 41 (H) 10/03/2015 0434   LDLCALC 76 07/06/2020 1116   LDLDIRECT 72.0 06/02/2016 0844    CBC    Component Value Date/Time   WBC 3.7 (L) 01/02/2022 0426   RBC 4.36 01/02/2022 0426   HGB 12.5 (L) 01/02/2022 0426   HGB 14.1 11/19/2021 1640   HCT 37.4 (L) 01/02/2022 0426   HCT 40.5 11/19/2021 1640   PLT 152 01/03/2022 0557   PLT 140 (L) 11/19/2021 1640   MCV 85.8 01/02/2022 0426   MCV 84 11/19/2021 1640    MCH 28.7 01/02/2022 0426   MCHC 33.4 01/02/2022 0426   RDW 15.0 01/02/2022 0426   RDW 13.3 11/19/2021 1640   LYMPHSABS 1.3 12/15/2021 0659   LYMPHSABS 1.7 11/19/2021 1640   MONOABS 0.5 12/15/2021 0659   EOSABS 0.1 12/15/2021 0659   EOSABS 0.0 11/19/2021 1640   BASOSABS 0.0 12/15/2021 0659   BASOSABS 0.0 11/19/2021 1640    ASSESSMENT AND PLAN: 1. Hospital discharge follow-up   2. Type 2 diabetes mellitus with stage 3a chronic kidney disease, with long-term current use of insulin (HCC) Not at goal Increase Lantus to 26 units daily.  Continue NovoLog 3 units with meals. - POCT glucose (manual entry) - Comprehensive metabolic panel  3. Thyroid nodule We will get a thyroid ultrasound and thyroid function tests.  Further management will be based on results. - TSH+T4F+T3Free - US THYROID; Future  4. Schizoaffective disorder, bipolar type [facility]) Keep appointment with psychiatrist tomorrow.  Advised to inform his psychiatrist that he needed ECT treatments x3 while in the hospital.  5. Dry eyes Refill sent on artificial tears.  6. Hyperlipidemia due to type 2 diabetes mellitus ([place]) Patient to restart atorvastatin.  We will check kidney and liver function test. - atorvastatin (LIPITOR) 10 MG tablet; Take 1 tablet (10 mg total) by mouth daily.  Dispense: 90 tablet; Refill: 3  7. Anemia of chronic disease  Stable at the time of hospital discharge.  We will recheck today - CBC  8. Hypomagnesemia - Magnesium  9. Need for immunization against influenza - Flu Vaccine QUAD 66moIM (Fluarix, Fluzone & Alfiuria Quad PF)    Patient was given the opportunity to ask questions.  Patient verbalized understanding of the plan and was able to repeat key elements of the plan.   This documentation was completed using DRadio producer  Any transcriptional errors are unintentional.  Orders Placed This Encounter  Procedures  . [place]RO[place]  . Flu Vaccine QUAD 629moM (Fluarix,  Fluzone & Alfiuria Quad PF)  . TSH+T4F+T3Free  . CBC  . Comprehensive metabolic panel  . Magnesium  . POCT glucose (manual entry)     Requested Prescriptions   Signed Prescriptions Disp Refills  . atorvastatin (LIPITOR) 10 MG tablet 90 tablet 3    Sig: Take 1 tablet (10 mg total) by mouth daily.    Return in about 2 months (around 04/12/2022) for With PCP Dr. NeMargarita Rana DeKarle PlumberMD, FACP

## 2022-02-10 NOTE — Patient Instructions (Signed)
Increase Lantus insulin to 26 units daily.  Continue NovoLog insulin 3 units with meals. Please remember to call and schedule your appointment with the endocrinologist.

## 2022-02-11 ENCOUNTER — Other Ambulatory Visit: Payer: Self-pay | Admitting: Internal Medicine

## 2022-02-11 DIAGNOSIS — E785 Hyperlipidemia, unspecified: Secondary | ICD-10-CM | POA: Diagnosis not present

## 2022-02-11 DIAGNOSIS — J690 Pneumonitis due to inhalation of food and vomit: Secondary | ICD-10-CM | POA: Diagnosis not present

## 2022-02-11 DIAGNOSIS — E1165 Type 2 diabetes mellitus with hyperglycemia: Secondary | ICD-10-CM | POA: Diagnosis not present

## 2022-02-11 DIAGNOSIS — L89622 Pressure ulcer of left heel, stage 2: Secondary | ICD-10-CM | POA: Diagnosis not present

## 2022-02-11 DIAGNOSIS — E1140 Type 2 diabetes mellitus with diabetic neuropathy, unspecified: Secondary | ICD-10-CM | POA: Diagnosis not present

## 2022-02-11 DIAGNOSIS — D631 Anemia in chronic kidney disease: Secondary | ICD-10-CM | POA: Diagnosis not present

## 2022-02-11 DIAGNOSIS — I129 Hypertensive chronic kidney disease with stage 1 through stage 4 chronic kidney disease, or unspecified chronic kidney disease: Secondary | ICD-10-CM | POA: Diagnosis not present

## 2022-02-11 DIAGNOSIS — G9341 Metabolic encephalopathy: Secondary | ICD-10-CM | POA: Diagnosis not present

## 2022-02-11 DIAGNOSIS — N1831 Chronic kidney disease, stage 3a: Secondary | ICD-10-CM | POA: Diagnosis not present

## 2022-02-11 DIAGNOSIS — E1122 Type 2 diabetes mellitus with diabetic chronic kidney disease: Secondary | ICD-10-CM | POA: Diagnosis not present

## 2022-02-11 LAB — COMPREHENSIVE METABOLIC PANEL
ALT: 23 IU/L (ref 0–44)
AST: 20 IU/L (ref 0–40)
Albumin/Globulin Ratio: 1.9 (ref 1.2–2.2)
Albumin: 4.8 g/dL (ref 3.8–4.9)
Alkaline Phosphatase: 73 IU/L (ref 44–121)
BUN/Creatinine Ratio: 13 (ref 9–20)
BUN: 16 mg/dL (ref 6–24)
Bilirubin Total: 0.3 mg/dL (ref 0.0–1.2)
CO2: 24 mmol/L (ref 20–29)
Calcium: 10.1 mg/dL (ref 8.7–10.2)
Chloride: 105 mmol/L (ref 96–106)
Creatinine, Ser: 1.21 mg/dL (ref 0.76–1.27)
Globulin, Total: 2.5 g/dL (ref 1.5–4.5)
Glucose: 148 mg/dL — ABNORMAL HIGH (ref 70–99)
Potassium: 5.2 mmol/L (ref 3.5–5.2)
Sodium: 143 mmol/L (ref 134–144)
Total Protein: 7.3 g/dL (ref 6.0–8.5)
eGFR: 71 mL/min/{1.73_m2} (ref >59)

## 2022-02-11 LAB — TSH+T4F+T3FREE
Free T4: 1.28 ng/dL (ref 0.82–1.77)
T3, Free: 3.3 pg/mL (ref 2.0–4.4)
TSH: 1.47 u[IU]/mL (ref 0.450–4.500)

## 2022-02-11 LAB — CBC
Hematocrit: 45.6 % (ref 37.5–51.0)
Hemoglobin: 15.3 g/dL (ref 13.0–17.7)
MCH: 29.4 pg (ref 26.6–33.0)
MCHC: 33.6 g/dL (ref 31.5–35.7)
MCV: 88 fL (ref 79–97)
Platelets: 177 10*3/uL (ref 150–450)
RBC: 5.2 x10E6/uL (ref 4.14–5.80)
RDW: 13.3 % (ref 11.6–15.4)
WBC: 7.9 10*3/uL (ref 3.4–10.8)

## 2022-02-11 LAB — MAGNESIUM: Magnesium: 1.8 mg/dL (ref 1.6–2.3)

## 2022-02-11 MED ORDER — MAGNESIUM OXIDE -MG SUPPLEMENT 400 (240 MG) MG PO TABS: 400.0000 mg | ORAL_TABLET | Freq: Every day | ORAL | 0 refills | Status: DC

## 2022-02-14 ENCOUNTER — Ambulatory Visit
Admission: RE | Admit: 2022-02-14 | Discharge: 2022-02-14 | Disposition: A | Discharge status: Home / Self Care | Payer: Medicare Other | Source: Ambulatory Visit | Attending: Internal Medicine | Admitting: Internal Medicine

## 2022-02-14 ENCOUNTER — Encounter: Payer: Self-pay | Admitting: Internal Medicine

## 2022-02-14 DIAGNOSIS — E041 Nontoxic single thyroid nodule: Secondary | ICD-10-CM

## 2022-02-14 DIAGNOSIS — E042 Nontoxic multinodular goiter: Secondary | ICD-10-CM | POA: Insufficient documentation

## 2022-02-17 DIAGNOSIS — E1165 Type 2 diabetes mellitus with hyperglycemia: Secondary | ICD-10-CM | POA: Diagnosis not present

## 2022-02-17 DIAGNOSIS — N1831 Chronic kidney disease, stage 3a: Secondary | ICD-10-CM | POA: Diagnosis not present

## 2022-02-17 DIAGNOSIS — D631 Anemia in chronic kidney disease: Secondary | ICD-10-CM | POA: Diagnosis not present

## 2022-02-17 DIAGNOSIS — E1122 Type 2 diabetes mellitus with diabetic chronic kidney disease: Secondary | ICD-10-CM | POA: Diagnosis not present

## 2022-02-17 DIAGNOSIS — J690 Pneumonitis due to inhalation of food and vomit: Secondary | ICD-10-CM | POA: Diagnosis not present

## 2022-02-17 DIAGNOSIS — L89622 Pressure ulcer of left heel, stage 2: Secondary | ICD-10-CM | POA: Diagnosis not present

## 2022-02-17 DIAGNOSIS — I129 Hypertensive chronic kidney disease with stage 1 through stage 4 chronic kidney disease, or unspecified chronic kidney disease: Secondary | ICD-10-CM | POA: Diagnosis not present

## 2022-02-17 DIAGNOSIS — E785 Hyperlipidemia, unspecified: Secondary | ICD-10-CM | POA: Diagnosis not present

## 2022-02-17 DIAGNOSIS — G9341 Metabolic encephalopathy: Secondary | ICD-10-CM | POA: Diagnosis not present

## 2022-02-17 DIAGNOSIS — E1140 Type 2 diabetes mellitus with diabetic neuropathy, unspecified: Secondary | ICD-10-CM | POA: Diagnosis not present

## 2022-02-18 ENCOUNTER — Encounter: Payer: Self-pay | Admitting: *Deleted

## 2022-02-19 ENCOUNTER — Encounter: Payer: Self-pay | Admitting: Family Medicine

## 2022-02-20 ENCOUNTER — Institutional Professional Consult (permissible substitution): Payer: Medicare Other | Admitting: Diagnostic Neuroimaging

## 2022-02-21 NOTE — Telephone Encounter (Signed)
Letter faxed.

## 2022-02-24 ENCOUNTER — Other Ambulatory Visit: Payer: Self-pay | Admitting: Family Medicine

## 2022-02-24 DIAGNOSIS — E781 Pure hyperglyceridemia: Secondary | ICD-10-CM

## 2022-02-25 DIAGNOSIS — E1165 Type 2 diabetes mellitus with hyperglycemia: Secondary | ICD-10-CM | POA: Diagnosis not present

## 2022-03-02 ENCOUNTER — Other Ambulatory Visit: Payer: Self-pay | Admitting: Family Medicine

## 2022-03-02 DIAGNOSIS — I152 Hypertension secondary to endocrine disorders: Secondary | ICD-10-CM

## 2022-03-03 ENCOUNTER — Ambulatory Visit: Payer: Self-pay | Admitting: *Deleted

## 2022-03-03 NOTE — Telephone Encounter (Signed)
Medication d/c'd 11/19/2021 - not on med list. Requested Prescriptions  Pending Prescriptions Disp Refills   lisinopril (ZESTRIL) 5 MG tablet [Pharmacy Med Name: Lisinopril 5 MG Oral Tablet] 70 tablet 4    Sig: TAKE 1 TABLET BY MOUTH DAILY     Cardiovascular:  ACE Inhibitors Passed - 03/02/2022 11:16 PM      Passed - Cr in normal range and within 180 days    Creatinine, Ser  Date Value Ref Range Status  02/10/2022 1.21 0.76 - 1.27 mg/dL Final   Creatinine,U  Date Value Ref Range Status  04/18/2015 250.2 mg/dL Final   Creatinine, Urine  Date Value Ref Range Status  10/03/2015 35.87 mg/dL Final         Passed - K in normal range and within 180 days    Potassium  Date Value Ref Range Status  02/10/2022 5.2 3.5 - 5.2 mmol/L Final         Passed - Patient is not pregnant      Passed - Last BP in normal range    BP Readings from Last 1 Encounters:  02/10/22 120/79         Passed - Valid encounter within last 6 months    Recent Outpatient Visits           3 weeks ago Hospital discharge follow-up   [place] Ladell Pier, MD   1 month ago Abscess, scalp   [place] Karle Plumber B, MD   3 months ago Type 2 diabetes mellitus with stage 4 chronic kidney disease, with long-term current use of insulin ([place])   [place], [place], MD   6 months ago Type 2 diabetes mellitus with stage 4 chronic kidney disease, with long-term current use of insulin ([place])   [place], [place], MD   7 months ago Postural drop in blood pressure   [place], Vernia Buff, NP       Future Appointments             In 1 month [name], MD [place]

## 2022-03-03 NOTE — Telephone Encounter (Signed)
  Chief Complaint: Muscle Soreness Symptoms: Reports muscular soreness "Entire body." Started after taking 2 doses of Atorvastatin 2 weeks ago (02/10/22) Reports has not taken last 2 days and no longer with soreness.  Frequency:  2 weeks Pertinent Negatives: Patient denies rash, fever, swelling Disposition: '[]'$ ED /'[]'$ Urgent Care (no appt availability in office) / '[]'$ Appointment(In office/virtual)/ '[]'$  [place] [facility]/ '[]'$ Home Care/ '[]'$ Refused Recommended Disposition /'[]'$ [place] [facility]/ '[x]'$  Follow-up with PCP Additional Notes: Care advise provided, verbalizes understanding. Please advise regarding medication, no availability. Thank you. Reason for Disposition  [1] MILD or MODERATE muscle aches or pain AND [2] taking a statin medicine (a lipid or cholesterol lowering drug)  Answer Assessment - Initial Assessment Questions 1. ONSET: "When did the muscle aches or body pains start?"      2 weeks ago, took 2 doses, started hurting right after. 2. LOCATION: "What part of your body is hurting?" (e.g., entire body, arms, legs)      All over muscular 3. SEVERITY: "How bad is the pain?" (Scale 1-10; or mild, moderate, severe)   - MILD (1-3): doesn't interfere with normal activities    - MODERATE (4-7): interferes with normal activities or awakens from sleep    - SEVERE (8-10):  excruciating pain, unable to do any normal activities      6-7/10, constant 4. CAUSE: "What do you think is causing the pains?"     Atorvastatin 5. FEVER: "Have you been having fever?"     No 6. OTHER SYMPTOMS: "Do you have any other symptoms?" (e.g., chest pain, weakness, rash, cold or flu symptoms, weight loss)     Sneezing  Protocols used: Muscle Aches and Body Pain-A-AH

## 2022-03-03 NOTE — Telephone Encounter (Signed)
Pt states atorvastatin (LIPITOR) 10 MG tablet is causing arm and leg pain   Pt inquiring if another medication can be prescribed   Please assist further

## 2022-03-04 NOTE — Telephone Encounter (Signed)
Please advise him to take it every other day to see if symptoms improve and if symptoms persist he can hold off on it until his visit with me.

## 2022-03-05 NOTE — Telephone Encounter (Signed)
Patient aware of message per Dr. Margarita Rana.  Verbalizes understanding.

## 2022-03-17 ENCOUNTER — Encounter: Payer: Medicare Other | Admitting: Family Medicine

## 2022-03-26 ENCOUNTER — Other Ambulatory Visit: Payer: Self-pay | Admitting: Family Medicine

## 2022-03-26 DIAGNOSIS — M5136 Other intervertebral disc degeneration, lumbar region: Secondary | ICD-10-CM

## 2022-04-03 DIAGNOSIS — F250 Schizoaffective disorder, bipolar type: Secondary | ICD-10-CM | POA: Diagnosis not present

## 2022-04-07 DIAGNOSIS — I129 Hypertensive chronic kidney disease with stage 1 through stage 4 chronic kidney disease, or unspecified chronic kidney disease: Secondary | ICD-10-CM | POA: Diagnosis not present

## 2022-04-07 DIAGNOSIS — N189 Chronic kidney disease, unspecified: Secondary | ICD-10-CM | POA: Diagnosis not present

## 2022-04-07 DIAGNOSIS — F319 Bipolar disorder, unspecified: Secondary | ICD-10-CM | POA: Diagnosis not present

## 2022-04-07 DIAGNOSIS — E1122 Type 2 diabetes mellitus with diabetic chronic kidney disease: Secondary | ICD-10-CM | POA: Diagnosis not present

## 2022-04-07 DIAGNOSIS — F0390 Unspecified dementia without behavioral disturbance: Secondary | ICD-10-CM | POA: Diagnosis not present

## 2022-04-07 DIAGNOSIS — N1832 Chronic kidney disease, stage 3b: Secondary | ICD-10-CM | POA: Diagnosis not present

## 2022-04-07 DIAGNOSIS — D631 Anemia in chronic kidney disease: Secondary | ICD-10-CM | POA: Diagnosis not present

## 2022-04-08 ENCOUNTER — Encounter: Payer: Self-pay | Admitting: Diagnostic Neuroimaging

## 2022-04-08 ENCOUNTER — Ambulatory Visit (INDEPENDENT_AMBULATORY_CARE_PROVIDER_SITE_OTHER): Payer: Medicare HMO | Admitting: Diagnostic Neuroimaging

## 2022-04-08 VITALS — BP 125/81 | HR 88 | Ht 76.0 in | Wt 216.0 lb

## 2022-04-08 DIAGNOSIS — R413 Other amnesia: Secondary | ICD-10-CM | POA: Diagnosis not present

## 2022-04-08 DIAGNOSIS — R269 Unspecified abnormalities of gait and mobility: Secondary | ICD-10-CM | POA: Diagnosis not present

## 2022-04-08 NOTE — Progress Notes (Signed)
[facility]  PATIENT: [name]. DOB: 12-Dec-1965  REFERRING CLINICIAN: [name], MD HISTORY FROM: patient  REASON FOR VISIT: follow up   HISTORICAL  CHIEF COMPLAINT:  Chief Complaint  Patient presents with   Neurologic Problem    Pt with wife, rm 4. Was in the hospital for > month. Was dc in October. Would like to discuss medications that was previously on and currently on. He was on several medications per psych and in the hospital they took him off of all of his medications except abilfy and trazodone.     HISTORY OF PRESENT ILLNESS:   UPDATE (04/08/22, VRP): Since last visit, was hospitalized for toxic / metabolic encephalopathy in Sept 2023 (due to polypharmacy, aspiration PNA, AKI). Then went to [facility] for ECT and did much better. Now back home.   UPDATE (05/08/21, VRP): Since last visit, here for continued memory and gait difficulties. Symptoms are fluctuating day to day. Overall no change in the last 1-2 years. Also with gait and balance issues. Ongoing low back issues.  PRIOR HPI (09/14/19): 56 year old male with PTSD and bipolar disorder here for evaluation memory loss.  Patient previously had diagnosis of "early onset dementia" but in 2017 and 2018 this was not felt to be accurate.  Memory and confusion spells were thought to be related to psychiatry issues and metabolic derangements.  Patient is currently living alone.  He has support from his sister who is also his power of attorney.  He has had multiple emergency room visits and hospitalizations over the past few years.  These have been related to acute encephalopathy, hyperglycemia, falls, balance difficulties.   REVIEW OF SYSTEMS: Full 14 system review of systems performed and negative with exception of: As per HPI.  ALLERGIES: Allergies  Allergen Reactions   Acetaminophen    Hydrocodone Bitartrate Er    Vicodin [Hydrocodone-Acetaminophen] Itching    HOME  MEDICATIONS: Outpatient Medications Prior to Visit  Medication Sig Dispense Refill   ARIPiprazole (ABILIFY) 10 MG tablet Take 1 tablet (10 mg total) by mouth daily. 30 tablet 0   atorvastatin (LIPITOR) 10 MG tablet Take 1 tablet (10 mg total) by mouth daily. 90 tablet 3   Cholecalciferol (VITAMIN D-3) 125 MCG (5000 UT) TABS Take 125 mcg by mouth daily.     Continuous Blood Gluc Receiver (FREESTYLE LIBRE READER) DEVI Use as directed 1 each each   Continuous Blood Gluc Sensor (FREESTYLE LIBRE 14 DAY SENSOR) MISC Use as directed 2 each 4   Continuous Blood Gluc Sensor (FREESTYLE LIBRE 2 SENSOR) MISC by Does not apply route.     fluticasone (FLONASE) 50 MCG/ACT nasal spray      glucose blood (ACCU-CHEK GUIDE) test strip CHECK SUGAR FOUR TIMES DAILY 100 each 12   insulin glargine (LANTUS) 100 UNIT/ML injection Inject 0.24 mLs (24 Units total) into the skin daily before breakfast. 10 mL 11   insulin lispro (HUMALOG) 100 UNIT/ML injection 3 units with meals.  Do not take if you dont eat 10 mL 11   Insulin Pen Needle (B-D UF III MINI PEN NEEDLES) 31G X 5 MM MISC USE 4 TIMES DAILY 120 each 12   losartan (COZAAR) 25 MG tablet Take 25 mg by mouth daily.     magnesium oxide (MAG-OX) 400 (240 Mg) MG tablet Take 1 tablet (400 mg total) by mouth daily. 30 tablet 0   Multiple Vitamins-Minerals (MULTIVITAMIN WITH MINERALS) tablet Take 1 tablet by mouth daily.     pantoprazole (  PROTONIX) 40 MG tablet Take 40 mg by mouth daily.     polyvinyl alcohol (LIQUIFILM TEARS) 1.4 % ophthalmic solution Place 1 drop into both eyes as needed for dry eyes. 15 mL 0   testosterone (ANDROGEL) 50 MG/5GM (1%) GEL Place 5 g onto the skin daily.     traZODone (DESYREL) 150 MG tablet Take 150 mg by mouth at bedtime.     buPROPion (WELLBUTRIN SR) 150 MG 12 hr tablet Take 150 mg by mouth 2 (two) times daily.     chlorothiazide (DIURIL) 250 MG/5ML suspension Take 250 mg by mouth daily.     clonazePAM (KLONOPIN) 2 MG tablet Take 2 mg by  mouth 2 (two) times daily.     gabapentin (NEURONTIN) 300 MG capsule TAKE 1 CAPSULE BY MOUTH TWICE  DAILY 200 capsule 2   oxyCODONE-acetaminophen (PERCOCET) 7.5-325 MG tablet Take 1 tablet by mouth every 4 (four) hours as needed for severe pain.     QUEtiapine (SEROQUEL XR) 400 MG 24 hr tablet Take 600 mg by mouth at bedtime.     sildenafil (VIAGRA) 25 MG tablet Take 25 mg by mouth daily as needed for erectile dysfunction.     vitamin B-12 (CYANOCOBALAMIN) 50 MCG tablet Take 50 mcg by mouth daily.     No facility-administered medications prior to visit.    PAST MEDICAL HISTORY: Past Medical History:  Diagnosis Date   ADD (attention deficit disorder)    Anxiety    Arthritis    right hip   Bipolar 1 disorder (HCC)    Blood in urine    CKD (chronic kidney disease), stage III (HCC)    Creatinine elevation    Dementia ([place])    "early onset" (08/04/2017)   Depression    bipolar [facility]   Diabetes mellitus without complication ([place])    Dizziness    Family history of anesthesia complication    pt is unsure , but pt father may have been difficult to arouse    Frequent falls    HCAP (healthcare-associated pneumonia) 10/31/2012   History of kidney stones    Hypertension    Hypogonadism male    Liver fatty degeneration    Lumbar radiculopathy    Microscopic hematuria    hereditary s/p Urology eval   Neuromuscular disorder ([place])    feet neuropathy    Osteoarthritis of right hip 11/28/2011   2012 2015 s/p THR Severe  Dr Novella Olive     Pleural effusion 11/02/2012   Pneumonia 10/2012   Pneumonia, organism unspecified(486) 11/02/2012   Polysubstance dependence, non-opioid, in remission ([place])    remote   Primary osteoarthritis of left hip 05/22/2015   PTSD (post-traumatic stress disorder)    SOCIAL ANXIETY DISORDER    Schizoaffective disorder ([place])    Substance abuse ([place])    Suicide attempt by multiple drug overdose ([place]) 2016/01/03   Grieving his cat's death 07-10-2015    PAST  SURGICAL HISTORY: Past Surgical History:  Procedure Laterality Date   BACK SURGERY     CLOSED REDUCTION METACARPAL WITH PERCUTANEOUS PINNING Right    LUMBAR [place]     TOTAL HIP ARTHROPLASTY Right 08/16/2013   Procedure: TOTAL HIP ARTHROPLASTY ANTERIOR APPROACH;  Surgeon: Hessie Dibble, MD;  Location: [facility];  Service: Orthopedics;  Laterality: Right;   TOTAL HIP ARTHROPLASTY Left 05/22/2015   Procedure: TOTAL HIP ARTHROPLASTY ANTERIOR APPROACH;  Surgeon: Melrose Nakayama, MD;  Location: [facility];  Service: Orthopedics;  Laterality:  Left;    FAMILY HISTORY: Family History  Problem Relation Age of Onset   Diabetes Father    Cancer Mother        died of melanoma with mets   Cervical cancer Sister    Diabetes Sister    Other Neg Hx        hypogonadism   Colon cancer Neg Hx    Colon polyps Neg Hx    Esophageal cancer Neg Hx    Rectal cancer Neg Hx    Stomach cancer Neg Hx     SOCIAL HISTORY: Social History   Socioeconomic History   Marital status: Single    Spouse name: Not on file   Number of children: 0   Years of education: 14   Highest education level: Some college, no degree  Occupational History   Occupation: disability  Tobacco Use   Smoking status: Former    Years: 0.25    Types: Cigarettes    Quit date: 03/22/2019    Years since quitting: 3.0   Smokeless tobacco: Never  Vaping Use   Vaping Use: Never used  Substance and Sexual Activity   Alcohol use: Not Currently   Drug use: No    Comment: hx of marijuana/cocaine/crack use but sober since 20's   Sexual activity: Not Currently  Other Topics Concern   Not on file  Social History Narrative   05/08/2021 lives alone, sister [name] helps with meds, he has some in home care, lived with sister until Nov 2020   Caffeine- sodas, amount  varies   regular exercise-no   Social Determinants of Health   Financial Resource Strain: Low Risk  (01/31/2022)   Overall Financial Resource Strain (CARDIA)     Difficulty of Paying Living Expenses: Not very hard  Food Insecurity: No Food Insecurity (01/31/2022)   Hunger Vital Sign    Worried About Running Out of Food in the Last Year: Never true    Ran Out of Food in the Last Year: Never true  Transportation Needs: No Transportation Needs (01/31/2022)   PRAPARE - Hydrologist (Medical): No    Lack of Transportation (Non-Medical): No  Physical Activity: Insufficiently Active (01/31/2022)   Exercise Vital Sign    Days of Exercise per Week: 7 days    Minutes of Exercise per Session: 20 min  Stress: No Stress Concern Present (01/31/2022)   [place]    Feeling of Stress : Not at all  Social Connections: Socially Isolated (01/31/2022)   Social Connection and Isolation Panel [NHANES]    Frequency of Communication with Friends and Family: More than three times a week    Frequency of Social Gatherings with Friends and Family: Never    Attends Religious Services: Never    Marine scientist or Organizations: No    Attends Archivist Meetings: Never    Marital Status: Never married  Intimate Partner Violence: Not At Risk (01/31/2022)   Humiliation, Afraid, Rape, and Kick questionnaire    Fear of Current or Ex-Partner: No    Emotionally Abused: No    Physically Abused: No    Sexually Abused: No     PHYSICAL EXAM  GENERAL EXAM/CONSTITUTIONAL: Vitals:  Vitals:   04/08/22 1127  BP: 125/81  Pulse: 88  Weight: 216 lb (98 kg)  Height: '6\' 4"'$  (1.93 m)    Body mass index is 26.29 kg/m. Wt Readings from Last 3 Encounters:  04/08/22 216 lb (98 kg)  02/10/22 192 lb 12.8 oz (87.5 kg)  01/03/22 192 lb 7.4 oz (87.3 kg)   Patient is in no distress; well developed, nourished and groomed; neck is supple  CARDIOVASCULAR: Examination of carotid arteries is normal; no carotid bruits Regular rate and rhythm, no murmurs Examination of  peripheral vascular system by observation and palpation is normal  EYES: Ophthalmoscopic exam of optic discs and posterior segments is normal; no papilledema or hemorrhages No results found.  MUSCULOSKELETAL: Gait, strength, tone, movements noted in Neurologic exam below  NEUROLOGIC: MENTAL STATUS:     01/31/2022   12:10 PM 05/08/2021    2:37 PM 09/14/2019   11:29 AM  MMSE - Mini Mental State Exam  Orientation to time '5 3 5  '$ Orientation to Place '5 4 4  '$ Registration '3 3 3  '$ Attention/ Calculation 5 0 0  Recall '3 2 2  '$ Language- name 2 objects '2 2 2  '$ Language- repeat '1 1 1  '$ Language- follow 3 step command '3 3 3  '$ Language- read & follow direction '1 1 1  '$ Write a sentence '1 1 1  '$ Copy design 1 1 0  Total score '30 21 22   '$ awake, alert, oriented to person, place and time recent and remote memory intact normal attention and concentration language fluent, comprehension intact, naming intact fund of knowledge appropriate  CRANIAL NERVE:  2nd - no papilledema on fundoscopic exam 2nd, 3rd, 4th, 6th - pupils equal and reactive to light, visual fields full to confrontation, extraocular muscles intact, no nystagmus 5th - facial sensation symmetric 7th - facial strength symmetric 8th - hearing intact 9th - palate elevates symmetrically, uvula midline 11th - shoulder shrug symmetric 12th - tongue protrusion midline  MOTOR:  normal bulk and tone, full strength in the BUE, BLE  SENSORY:  normal and symmetric to light touch, temperature, vibration; DECR IN FEET  COORDINATION:  finger-nose-finger, fine finger movements normal  REFLEXES:  deep tendon reflexes TRACE and symmetric  GAIT/STATION:  UNSTEADY GAIT; USING WALKER     DIAGNOSTIC DATA (LABS, IMAGING, TESTING) - I reviewed patient records, labs, notes, testing and imaging myself where available.  Lab Results  Component Value Date   WBC 7.9 02/10/2022   HGB 15.3 02/10/2022   HCT 45.6 02/10/2022   MCV 88 02/10/2022    PLT 177 02/10/2022      Component Value Date/Time   NA 143 02/10/2022 1552   K 5.2 02/10/2022 1552   CL 105 02/10/2022 1552   CO2 24 02/10/2022 1552   GLUCOSE 148 (H) 02/10/2022 1552   GLUCOSE 108 (H) 01/03/2022 0557   BUN 16 02/10/2022 1552   CREATININE 1.21 02/10/2022 1552   CALCIUM 10.1 02/10/2022 1552   PROT 7.3 02/10/2022 1552   ALBUMIN 4.8 02/10/2022 1552   AST 20 02/10/2022 1552   ALT 23 02/10/2022 1552   ALKPHOS 73 02/10/2022 1552   BILITOT 0.3 02/10/2022 1552   GFRNONAA >60 01/03/2022 0557   GFRAA >60 01/04/2020 0844   Lab Results  Component Value Date   CHOL 167 07/06/2020   HDL 39 (L) 07/06/2020   LDLCALC 76 07/06/2020   LDLDIRECT 72.0 06/02/2016   TRIG 323 (H) 07/06/2020   CHOLHDL 4.3 07/06/2020   Lab Results  Component Value Date   HGBA1C 8.0 (H) 12/02/2021   Lab Results  Component Value Date   [id number] 597 12/21/2021   Lab Results  Component Value Date   TSH 1.470 02/10/2022  09/10/16  MRI HEAD IMPRESSION: 1. No acute intracranial process identified. 2. Moderately advanced cerebral atrophy for patient age. Otherwise unremarkable brain MRI.   09/10/16  MRI CERVICAL SPINE IMPRESSION: 1. Large right paracentral disc protrusion at C5-6 with resultant moderate canal stenosis. Flattening of the right hemi cord without cord signal changes. 2. Broad posterior disc protrusion at C6-7 with mild to moderate canal stenosis. 3. Multifactorial degenerative changes with moderate to advanced bilateral C5 and C6 foraminal stenosis.   09/10/16  MRI THORACIC SPINE IMPRESSION: 1. Normal MRI appearance of the thoracic spinal cord. No significant canal stenosis identified. 2. Mild degenerative disc bulging at T4-5, T5-6, and T8-9 without stenosis. 3. Left-sided posterior element hypertrophy at T10-11 with resultant mild to moderate left foraminal stenosis.   05/14/19 CT head [I reviewed images myself and agree with interpretation. -VRP]  1. No acute  intracranial abnormality. No intracranial mass, hemorrhage or edema. No skull fracture. 2. No fracture or acute subluxation within the cervical spine. 3. Degenerative change within the cervical spine, most prominent at C5-6 where there is a prominent disc-osteophytic bulge and superimposed ossification of the posterior longitudinal ligament, causing moderate central canal stenosis with possible mass effect on the anterior portion of the cervical cord.  01/01/20 CT head  1. Generalized cerebral atrophy. 2. No acute intracranial abnormality    ASSESSMENT AND PLAN  56 y.o. year old male here with:  Dx:  1. Memory loss   2. Gait abnormality     PLAN:  MILD COGNITIVE IMPAIRMENT (due to bipolar d/o, PTSD, h/o substance abuse, polypharmacy) - supportive care - safety / supervision issues reviewed - no driving; caution with finances and medications; caution with living alone  GAIT DIFFICULTY (due to lumbar spine dz + diabetic neuropathy + polypharmacy) - continue supportive care  Return for return to PCP.    Penni Bombard, MD 55/04/5866, 25:74 PM Certified in Neurology, Neurophysiology and Neuroimaging  [facility] [address] [place], [place] [address] [phone]

## 2022-04-10 ENCOUNTER — Ambulatory Visit (INDEPENDENT_AMBULATORY_CARE_PROVIDER_SITE_OTHER): Payer: Medicare HMO | Admitting: Internal Medicine

## 2022-04-10 ENCOUNTER — Telehealth: Payer: Self-pay

## 2022-04-10 ENCOUNTER — Encounter: Payer: Self-pay | Admitting: Internal Medicine

## 2022-04-10 VITALS — BP 132/80 | HR 91 | Ht 76.0 in | Wt 216.8 lb

## 2022-04-10 DIAGNOSIS — E1122 Type 2 diabetes mellitus with diabetic chronic kidney disease: Secondary | ICD-10-CM

## 2022-04-10 DIAGNOSIS — E785 Hyperlipidemia, unspecified: Secondary | ICD-10-CM

## 2022-04-10 DIAGNOSIS — Z794 Long term (current) use of insulin: Secondary | ICD-10-CM

## 2022-04-10 DIAGNOSIS — N184 Chronic kidney disease, stage 4 (severe): Secondary | ICD-10-CM | POA: Diagnosis not present

## 2022-04-10 LAB — POCT GLYCOSYLATED HEMOGLOBIN (HGB A1C): Hemoglobin A1C: 8.7 % — AB (ref 4.0–5.6)

## 2022-04-10 MED ORDER — TRULICITY 0.75 MG/0.5ML ~~LOC~~ SOAJ: 0.7500 mg | SUBCUTANEOUS | 3 refills | Status: DC

## 2022-04-10 MED ORDER — INSULIN GLARGINE 100 UNIT/ML ~~LOC~~ SOLN: 37.0000 [IU] | Freq: Every day | SUBCUTANEOUS | Status: DC

## 2022-04-10 NOTE — Progress Notes (Signed)
Patient ID: Jonathan Davee., male   DOB: Jan 28, 1966, 56 y.o.   MRN: [id number]  HPI: Jonathan Jeffries. is a 56 y.o.-year-old male, returning for follow-up for DM2, dx in 2012, insulin-dependent since 2017, uncontrolled, with complications (stage 4 CKD, PN, fatty liver). Last OV 4 mo ago.  Interim hx: He has increased urination, no blurry vision, nausea, chest pain. He undergoes ECT treatments. He recently had a thyroid ultrasound showing 3 nodules, of which do need to be followed with annual ultrasounds.  This is managed by PCP. He was admitted 11/2021 for pneumonia.  His insulin doses were decreased at that time.  Sugars increased afterwards.  Reviewed HbA1c: Lab Results  Component Value Date   HGBA1C 8.0 (H) 12/02/2021   HGBA1C 9.4 (A) 09/02/2021   HGBA1C 7.8 (A) 06/11/2021   HGBA1C 7.1 (H) 05/27/2021   HGBA1C 8.1 (A) 11/14/2020   HGBA1C 8.0 (A) 08/30/2020   HGBA1C 7.5 (H) 07/06/2020   HGBA1C 6.4 05/11/2020   HGBA1C 7.9 (H) 01/03/2020   HGBA1C 9.0 (A) 11/01/2019   Pt was on a regimen of: - NPH 45 units in a.m. Prev. On Lantus,  Levemir, NovoLog, Humalog, Tradjenta,Metformin.  Now on: - Lantus 27 units daily (decreased 12/2021 after hospitalization) - Humalog U200 3 >> 10-20 units 15 min before each meal  Pt checks his sugars >4x a day with the Libre CGM - from [facility]:  Prev.:   Lowest sugar was 60s >> 60s; he has hypoglycemia awareness at 70.  Highest sugar was 400s >> HI.  Glucometer: Accu-Chek guide  - + CKD, last BUN/creatinine:  Lab Results  Component Value Date   BUN 16 02/10/2022   BUN 17 01/03/2022   CREATININE 1.21 02/10/2022   CREATININE 1.16 01/03/2022  On lisinopril.  - + HL; last set of lipids: Lab Results  Component Value Date   CHOL 167 07/06/2020   HDL 39 (L) 07/06/2020   LDLCALC 76 07/06/2020   LDLDIRECT 72.0 06/02/2016   TRIG 323 (H) 07/06/2020   CHOLHDL 4.3 07/06/2020  On Lipitor 10 mg daily, Tricor 145 mg daily  - last  eye exam was in  09/2021. No DR reportedly.   - no numbness and tingling in his feet.  On Neurontin 300 mg twice a day.  Last foot exam 110/2023 - Dr Elisha Ponder  He also has a history of hypogonadism, HTN, back pain, bipolar disorder, PTSD, history of suicide attempt. He has a history of schizoaffective disorder and also poor memory.  He has an aide that helps him.  He gives his own insulin.  ROS: + see HPI  Past Medical History:  Diagnosis Date   ADD (attention deficit disorder)    Anxiety    Arthritis    right hip   Bipolar 1 disorder (HCC)    Blood in urine    CKD (chronic kidney disease), stage III (HCC)    Creatinine elevation    Dementia ([place])    "early onset" (08/04/2017)   Depression    bipolar [facility]   Diabetes mellitus without complication ([place])    Dizziness    Family history of anesthesia complication    pt is unsure , but pt father may have been difficult to arouse    Frequent falls    HCAP (healthcare-associated pneumonia) 10/31/2012   History of kidney stones    Hypertension    Hypogonadism male    Liver fatty degeneration    Lumbar radiculopathy    Microscopic  hematuria    hereditary s/p Urology eval   Neuromuscular disorder [facility])    feet neuropathy    Osteoarthritis of right hip 11/28/2011   2012 2015 s/p THR Severe  Dr Novella Olive     Pleural effusion 11/02/2012   Pneumonia 10/2012   Pneumonia, organism unspecified(486) 11/02/2012   Polysubstance dependence, non-opioid, in remission ([place])    remote   Primary osteoarthritis of left hip 05/22/2015   PTSD (post-traumatic stress disorder)    SOCIAL ANXIETY DISORDER    Schizoaffective disorder ([place])    Substance abuse ([place])    Suicide attempt by multiple drug overdose ([place]) 12/27/2015   Grieving his cat's death 07-03-15   Past Surgical History:  Procedure Laterality Date   BACK SURGERY     CLOSED REDUCTION METACARPAL WITH PERCUTANEOUS PINNING Right    LUMBAR [place] HIP ARTHROPLASTY Right 08/16/2013   Procedure: TOTAL HIP ARTHROPLASTY ANTERIOR APPROACH;  Surgeon: Hessie Dibble, MD;  Location: [place];  Service: Orthopedics;  Laterality: Right;   TOTAL HIP ARTHROPLASTY Left 05/22/2015   Procedure: TOTAL HIP ARTHROPLASTY ANTERIOR APPROACH;  Surgeon: Melrose Nakayama, MD;  Location: [facility];  Service: Orthopedics;  Laterality: Left;   Social History   Socioeconomic History   Marital status: Single    Spouse name: Not on file   Number of children: 0   Years of education: 14   Highest education level: Some college, no degree  Occupational History   Occupation: disability  Tobacco Use   Smoking status: Former    Years: 0.25    Types: Cigarettes    Quit date: 03/22/2019    Years since quitting: 3.0   Smokeless tobacco: Never  Vaping Use   Vaping Use: Never used  Substance and Sexual Activity   Alcohol use: Not Currently   Drug use: No    Comment: hx of marijuana/cocaine/crack use but sober since Jul 03, 2022   Sexual activity: Not Currently  Other Topics Concern   Not on file  Social History Narrative   05/08/2021 lives alone, sister [name] helps with meds, he has some in home care, lived with sister until Nov 2020   Caffeine- sodas, amount  varies   regular exercise-no   Social Determinants of Health   Financial Resource Strain: Low Risk  (01/31/2022)   Overall Financial Resource Strain (CARDIA)    Difficulty of Paying Living Expenses: Not very hard  Food Insecurity: No Food Insecurity (01/31/2022)   Hunger Vital Sign    Worried About Running Out of Food in the Last Year: Never true    [place] in the Last Year: Never true  Transportation Needs: No Transportation Needs (01/31/2022)   PRAPARE - Hydrologist (Medical): No    Lack of Transportation (Non-Medical): No  Physical Activity: Insufficiently Active (01/31/2022)   Exercise Vital Sign    Days of Exercise per Week: 7 days    Minutes of Exercise per Session:  20 min  Stress: No Stress Concern Present (01/31/2022)   [place]    Feeling of Stress : Not at all  Social Connections: Socially Isolated (01/31/2022)   Social Connection and Isolation Panel [NHANES]    Frequency of Communication with Friends and Family: More than three times a week    Frequency of Social Gatherings with Friends and Family: Never    Attends Religious Services: Never  Active Member of Clubs or Organizations: No    Attends Archivist Meetings: Never    Marital Status: Never married  Intimate Partner Violence: Not At Risk (01/31/2022)   Humiliation, Afraid, Rape, and Kick questionnaire    Fear of Current or Ex-Partner: No    Emotionally Abused: No    Physically Abused: No    Sexually Abused: No   Current Outpatient Medications on File Prior to Visit  Medication Sig Dispense Refill   ARIPiprazole (ABILIFY) 10 MG tablet Take 1 tablet (10 mg total) by mouth daily. 30 tablet 0   atorvastatin (LIPITOR) 10 MG tablet Take 1 tablet (10 mg total) by mouth daily. 90 tablet 3   Cholecalciferol (VITAMIN D-3) 125 MCG (5000 UT) TABS Take 125 mcg by mouth daily.     Continuous Blood Gluc Receiver (FREESTYLE LIBRE READER) DEVI Use as directed 1 each each   Continuous Blood Gluc Sensor (FREESTYLE LIBRE 14 DAY SENSOR) MISC Use as directed 2 each 4   Continuous Blood Gluc Sensor (FREESTYLE LIBRE 2 SENSOR) MISC by Does not apply route.     fluticasone (FLONASE) 50 MCG/ACT nasal spray      glucose blood (ACCU-CHEK GUIDE) test strip CHECK SUGAR FOUR TIMES DAILY 100 each 12   insulin glargine (LANTUS) 100 UNIT/ML injection Inject 0.24 mLs (24 Units total) into the skin daily before breakfast. 10 mL 11   insulin lispro (HUMALOG) 100 UNIT/ML injection 3 units with meals.  Do not take if you dont eat 10 mL 11   Insulin Pen Needle (B-D UF III MINI PEN NEEDLES) 31G X 5 MM MISC USE 4 TIMES DAILY 120 each 12   losartan  (COZAAR) 25 MG tablet Take 25 mg by mouth daily.     magnesium oxide (MAG-OX) 400 (240 Mg) MG tablet Take 1 tablet (400 mg total) by mouth daily. 30 tablet 0   Multiple Vitamins-Minerals (MULTIVITAMIN WITH MINERALS) tablet Take 1 tablet by mouth daily.     pantoprazole (PROTONIX) 40 MG tablet Take 40 mg by mouth daily.     polyvinyl alcohol (LIQUIFILM TEARS) 1.4 % ophthalmic solution Place 1 drop into both eyes as needed for dry eyes. 15 mL 0   testosterone (ANDROGEL) 50 MG/5GM (1%) GEL Place 5 g onto the skin daily.     traZODone (DESYREL) 150 MG tablet Take 150 mg by mouth at bedtime.     No current facility-administered medications on file prior to visit.   Allergies  Allergen Reactions   Acetaminophen    Hydrocodone Bitartrate Er    Vicodin [Hydrocodone-Acetaminophen] Itching   Family History  Problem Relation Age of Onset   Diabetes Father    Cancer Mother        died of melanoma with mets   Cervical cancer Sister    Diabetes Sister    Other Neg Hx        hypogonadism   Colon cancer Neg Hx    Colon polyps Neg Hx    Esophageal cancer Neg Hx    Rectal cancer Neg Hx    Stomach cancer Neg Hx     PE: There were no vitals taken for this visit. Wt Readings from Last 3 Encounters:  04/08/22 216 lb (98 kg)  02/10/22 192 lb 12.8 oz (87.5 kg)  01/03/22 192 lb 7.4 oz (87.3 kg)   Constitutional: overweight, in NAD, walks with a walker Eyes:  EOMI, no exophthalmos ENT: no neck masses, no cervical lymphadenopathy Cardiovascular: RRR, No MRG Respiratory:  CTA B Musculoskeletal: no deformities Skin:no rashes Neurological: no tremor with outstretched hands  ASSESSMENT: 1. DM2, insulin-dependent, uncontrolled, with complications - CKD - PN - fatty liver  2. HL  PLAN:  1. Patient with long standing, uncontrolled, diabetes, on intermittent acting insulin at last visit, which was insufficient.  HbA1c was much higher at that time, at 9.4%.  Reviewing the CGM trends, sugars are  almost entirely above target, fluctuating around 300s.  We switched to Lantus and I advised him to add Humalog. -He was admitted 4 months ago and while in the hospital, his insulin doses were decreased by approximately 50%).  He continued on the decreased dose of Lantus, but, fortunately, he did not increase the dose of Humalog back afterwards. CGM interpretation: -At today's visit, we reviewed his CGM downloads: It appears that 25% of values are in target range (goal >70%), while 75% are higher than 180 (goal <25%), and 0% are lower than 70 (goal <4%).  The calculated average blood sugar is 218.  The projected HbA1c for the next 3 months ([facility]) is 8.5%. -Reviewing the CGM trends, sugars appear to be fluctuating almost entirely above the target range, between 180 and 250, with occasional blood sugars after dinner that are higher, but there is more variability in his blood sugars after this meal. -We discussed about increasing the dose of Lantus to bring all of his blood sugars down, and also to vary the dose of Humalog based on the size and consistency of his meals.  I also advised him to take Humalog 15 minutes before each meal, since now he is taking it at the start of the meal.  Upon his questioning, we can also start a GLP-1 receptor agonist.  Discussed about possible side effects.  Will start at a low dose and hopefully increase the dose as tolerated.  I am hoping that Medicaid covers Trulicity.  We discussed about Ozempic being on backorder at most pharmacies. - I suggested to:  Patient Instructions  Please increase: - Lantus 37 units before b'fast (may increase by 4 units every 4 days if sugars in am are not <130)  Use: - Humalog U200 15-20 units 15 min before each meal  Try to start: - Trulicity 7.74 mg weekly  Please come back for a follow-up appointment in 3 months.   - we checked his HbA1c: 8.7% (higher) - advised to check sugars at different times of the day - 4x a day, rotating check  times - advised for yearly eye exams >> he is UTD - return to clinic in 3-4 months  2. HL - Reviewed latest lipid panel from 06/2020: LDL at goal, triglycerides high, HDL low: Lab Results  Component Value Date   CHOL 167 07/06/2020   HDL 39 (L) 07/06/2020   LDLCALC 76 07/06/2020   LDLDIRECT 72.0 06/02/2016   TRIG 323 (H) 07/06/2020   CHOLHDL 4.3 07/06/2020  - Continues Lipitor 10 mg daily without side effects.  Philemon Kingdom, MD PhD [facility] Endocrinology

## 2022-04-10 NOTE — Telephone Encounter (Signed)
Patient requesting form be completed and faxed with clinical notes to [facility]. DME supplies ordered via [facility] through online portal.

## 2022-04-10 NOTE — Patient Instructions (Addendum)
Please increase: - Lantus 37 units before b'fast (may increase by 4 units every 4 days if sugars in am are not <130)  Use: - Humalog U200 15-20 units 15 min before each meal  Try to start: - Trulicity 4.62 mg weekly  Please come back for a follow-up appointment in 3 months.

## 2022-04-10 NOTE — Telephone Encounter (Signed)
Patient states that his PCP has already ordered his Freestyle Libre CGM. Pt states that is we tak over ordering his supplies they are to be ordered through [facility].

## 2022-04-15 ENCOUNTER — Ambulatory Visit: Payer: Medicare Other | Admitting: Family Medicine

## 2022-04-17 DIAGNOSIS — E1122 Type 2 diabetes mellitus with diabetic chronic kidney disease: Secondary | ICD-10-CM | POA: Diagnosis not present

## 2022-04-18 ENCOUNTER — Ambulatory Visit: Payer: Medicare Other | Admitting: Family Medicine

## 2022-04-18 ENCOUNTER — Encounter: Payer: Self-pay | Admitting: Family Medicine

## 2022-04-18 ENCOUNTER — Other Ambulatory Visit: Payer: Self-pay | Admitting: Internal Medicine

## 2022-04-18 ENCOUNTER — Ambulatory Visit: Payer: Medicare HMO | Attending: Family Medicine | Admitting: Family Medicine

## 2022-04-18 VITALS — BP 124/79 | HR 93 | Ht 76.0 in | Wt 216.0 lb

## 2022-04-18 DIAGNOSIS — Z833 Family history of diabetes mellitus: Secondary | ICD-10-CM | POA: Diagnosis not present

## 2022-04-18 DIAGNOSIS — M5136 Other intervertebral disc degeneration, lumbar region: Secondary | ICD-10-CM

## 2022-04-18 DIAGNOSIS — Z794 Long term (current) use of insulin: Secondary | ICD-10-CM | POA: Diagnosis not present

## 2022-04-18 DIAGNOSIS — M62830 Muscle spasm of back: Secondary | ICD-10-CM | POA: Insufficient documentation

## 2022-04-18 DIAGNOSIS — G8929 Other chronic pain: Secondary | ICD-10-CM | POA: Diagnosis not present

## 2022-04-18 DIAGNOSIS — M5116 Intervertebral disc disorders with radiculopathy, lumbar region: Secondary | ICD-10-CM | POA: Insufficient documentation

## 2022-04-18 DIAGNOSIS — R319 Hematuria, unspecified: Secondary | ICD-10-CM | POA: Insufficient documentation

## 2022-04-18 DIAGNOSIS — N401 Enlarged prostate with lower urinary tract symptoms: Secondary | ICD-10-CM | POA: Insufficient documentation

## 2022-04-18 DIAGNOSIS — K219 Gastro-esophageal reflux disease without esophagitis: Secondary | ICD-10-CM | POA: Diagnosis not present

## 2022-04-18 DIAGNOSIS — E785 Hyperlipidemia, unspecified: Secondary | ICD-10-CM | POA: Insufficient documentation

## 2022-04-18 DIAGNOSIS — M545 Low back pain, unspecified: Secondary | ICD-10-CM | POA: Diagnosis not present

## 2022-04-18 DIAGNOSIS — R1031 Right lower quadrant pain: Secondary | ICD-10-CM | POA: Diagnosis not present

## 2022-04-18 DIAGNOSIS — E1122 Type 2 diabetes mellitus with diabetic chronic kidney disease: Secondary | ICD-10-CM | POA: Diagnosis not present

## 2022-04-18 DIAGNOSIS — M62838 Other muscle spasm: Secondary | ICD-10-CM | POA: Diagnosis not present

## 2022-04-18 DIAGNOSIS — R338 Other retention of urine: Secondary | ICD-10-CM | POA: Insufficient documentation

## 2022-04-18 DIAGNOSIS — E781 Pure hyperglyceridemia: Secondary | ICD-10-CM | POA: Insufficient documentation

## 2022-04-18 DIAGNOSIS — I129 Hypertensive chronic kidney disease with stage 1 through stage 4 chronic kidney disease, or unspecified chronic kidney disease: Secondary | ICD-10-CM | POA: Insufficient documentation

## 2022-04-18 DIAGNOSIS — E1169 Type 2 diabetes mellitus with other specified complication: Secondary | ICD-10-CM

## 2022-04-18 DIAGNOSIS — Z87891 Personal history of nicotine dependence: Secondary | ICD-10-CM | POA: Diagnosis not present

## 2022-04-18 DIAGNOSIS — N1831 Chronic kidney disease, stage 3a: Secondary | ICD-10-CM | POA: Diagnosis not present

## 2022-04-18 DIAGNOSIS — E349 Endocrine disorder, unspecified: Secondary | ICD-10-CM

## 2022-04-18 DIAGNOSIS — Z79899 Other long term (current) drug therapy: Secondary | ICD-10-CM | POA: Diagnosis not present

## 2022-04-18 DIAGNOSIS — F319 Bipolar disorder, unspecified: Secondary | ICD-10-CM | POA: Diagnosis not present

## 2022-04-18 MED ORDER — TESTOSTERONE 20.25 MG/1.25GM (1.62%) TD GEL: TRANSDERMAL | 1 refills | Status: DC

## 2022-04-18 MED ORDER — FLUTICASONE PROPIONATE 50 MCG/ACT NA SUSP: 1.0000 | Freq: Every day | NASAL | 6 refills | Status: DC

## 2022-04-18 MED ORDER — ACETAMINOPHEN 500 MG PO TABS: 500.0000 mg | ORAL_TABLET | Freq: Three times a day (TID) | ORAL | 1 refills | Status: DC | PRN

## 2022-04-18 MED ORDER — PANTOPRAZOLE SODIUM 40 MG PO TBEC: 40.0000 mg | DELAYED_RELEASE_TABLET | Freq: Every day | ORAL | 1 refills | Status: DC

## 2022-04-18 MED ORDER — ALFUZOSIN HCL ER 10 MG PO TB24: 10.0000 mg | ORAL_TABLET | Freq: Every day | ORAL | 1 refills | Status: DC

## 2022-04-18 MED ORDER — POLYVINYL ALCOHOL 1.4 % OP SOLN: 1.0000 [drp] | OPHTHALMIC | 1 refills | Status: DC | PRN

## 2022-04-18 NOTE — Progress Notes (Signed)
Pain in lower right side of abdomen.

## 2022-04-18 NOTE — Patient Instructions (Signed)
Muscle Cramps and Spasms Muscle cramps and spasms are when muscles tighten by themselves. They usually get better within minutes. Muscle cramps are painful. They are usually stronger and last longer than muscle spasms. Muscle spasms may or may not be painful. They can last a few seconds or much longer. Cramps and spasms can affect any muscle, but they occur most often in the calf muscles of the leg. They are usually not caused by a serious problem. In many cases, the cause is not known. Some common causes include: Doing more physical work or exercise than your body is ready for. Using the muscles too much (overuse) by repeating certain movements too many times. Staying in a certain position for a long time. Playing a sport or doing an activity without preparing properly. Using bad form or technique while playing a sport or doing an activity. Not having enough water in your body (dehydration). Injury. Side effects of some medicines. Low levels of the salts and minerals in your blood (electrolytes), such as low potassium or calcium. Follow these instructions at home: Managing pain and stiffness     Massage, stretch, and relax the muscle. Do this for many minutes at a time. If told, put heat on tight or tense muscles as often as told by your doctor. Use the heat source that your doctor recommends, such as a moist heat pack or a heating pad. Place a towel between your skin and the heat source. Leave the heat on for 20-30 minutes. Remove the heat if your skin turns bright red. This is very important if you are not able to feel pain, heat, or cold. You may have a greater risk of getting burned. If told, put ice on the affected area. This may help if you are sore or have pain after a cramp or spasm. Put ice in a plastic bag. Place a towel between your skin and the bag. Leave the ice on for 20 minutes, 2-3 times a day. Try taking hot showers or baths to help relax tight muscles. Eating and  drinking Drink enough fluid to keep your pee (urine) pale yellow. Eat a healthy diet to help ensure that your muscles work well. This should include: Fruits and vegetables. Lean protein. Whole grains. Low-fat or nonfat dairy products. General instructions If you are having cramps often, avoid intense exercise for several days. Take over-the-counter and prescription medicines only as told by your doctor. Watch for any changes in your symptoms. Keep all follow-up visits as told by your doctor. This is important. Contact a doctor if: Your cramps or spasms get worse or happen more often. Your cramps or spasms do not get better with time. Summary Muscle cramps and spasms are when muscles tighten by themselves. They usually get better within minutes. Cramps and spasms occur most often in the calf muscles of the leg. Massage, stretch, and relax the muscle. This may help the cramp or spasm go away. Drink enough fluid to keep your pee (urine) pale yellow. This information is not intended to replace advice given to you by your health care provider. Make sure you discuss any questions you have with your health care provider. Document Revised: 10/26/2020 Document Reviewed: 10/26/2020 Elsevier Patient Education  [place].

## 2022-04-18 NOTE — Progress Notes (Signed)
Subjective:  Patient ID: Jonathan Meckel., male    DOB: 1965-05-28  Age: 56 y.o. MRN: [id number]  CC: Abdominal Pain   HPI Jonathan Holtsclaw. is a 56 y.o. 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  Polypharmacy was thought to be a major factor and most of his medications were discontinued. [name] subsequently underwent ECT treatment.  Interval History: [name] reports feeling well and has not had any falls since his medications were discontinued.  [name] no longer takes opioid analgesics for his chronic back pain but rather remains on Tylenol.  [name] is also not on Klonopin which [name] previously received from his psychiatrist.  Chart reveals [name] is allergic to Tylenol which [name] denies. [name] has has abdominal pain from his right lumbar pain which is sharp when [name] moves and it radiates to his RLQ, relived by taking Tylenol.  Denies presence of constipation, diarrhea, dysuria but does have hematuria which is chronic.  [name] has had urinary incontinence since his discharge from the Hospital.  [name] was previously on alfuzosin for BPH.  Previously followed by urology but has found it difficult to obtain an appointment.  His diabetes is managed by endocrine and [name] recently had a visit last week.  [name] complains of receiving a lower than normal dose of testosterone.  States with his previous dose [name] 'felt like a man' but now not so much.  His med list reveals a prescription for 1% testosterone pump but in the past [name] was on the 1.65%.  [name] has also been out of his testosterone. Requesting a 90-day supply of his medications as his Medicaid runs out at the end of the month.  Also request refill  of prescription for facial tears. Past Medical History:  Diagnosis Date   ADD (attention deficit disorder)    Anxiety    Arthritis    right hip   Bipolar 1 disorder (HCC)    Blood in urine    CKD (chronic kidney disease), stage III (HCC)    Creatinine elevation    Dementia ([place])    "early onset" (08/04/2017)   Depression    bipolar [facility]   Diabetes mellitus without complication ([place])    Dizziness    Family history of anesthesia complication    pt is unsure , but pt father may have been difficult to arouse    Frequent falls    HCAP (healthcare-associated pneumonia) 10/31/2012   History of kidney stones    Hypertension    Hypogonadism male    Liver fatty degeneration    Lumbar radiculopathy    Microscopic hematuria    hereditary s/p Urology eval   Neuromuscular disorder ([employer])    feet neuropathy    Osteoarthritis of right hip 11/28/2011   2012 2015 s/p THR Severe  Dr Novella Olive     Pleural effusion 11/02/2012   Pneumonia 10/2012   Pneumonia, organism unspecified([place]) 11/02/2012   Polysubstance dependence, non-opioid, in remission ([place])    remote   Primary osteoarthritis of left hip 05/22/2015   PTSD (post-traumatic stress disorder)    SOCIAL ANXIETY DISORDER    Schizoaffective disorder ([place])    Substance abuse ([place])    Suicide attempt by multiple drug overdose (  [place]) 12/26/2015   Grieving his cat's death 07-06-15    Past Surgical History:  Procedure Laterality Date   BACK SURGERY     CLOSED REDUCTION METACARPAL WITH PERCUTANEOUS PINNING Right    LUMBAR [place] SURGERY     TONSILLECTOMY     TOTAL HIP ARTHROPLASTY Right 08/16/2013   Procedure: TOTAL HIP ARTHROPLASTY ANTERIOR APPROACH;  Surgeon: Hessie Dibble, MD;  Location: [facility];  Service: Orthopedics;  Laterality: Right;   TOTAL HIP ARTHROPLASTY Left 05/22/2015   Procedure: TOTAL HIP ARTHROPLASTY ANTERIOR APPROACH;  Surgeon: Melrose Nakayama, MD;  Location: [facility];  Service: Orthopedics;  Laterality: Left;     Family History  Problem Relation Age of Onset   Diabetes Father    Cancer Mother        died of melanoma with mets   Cervical cancer Sister    Diabetes Sister    Other Neg Hx        hypogonadism   Colon cancer Neg Hx    Colon polyps Neg Hx    Esophageal cancer Neg Hx    Rectal cancer Neg Hx    Stomach cancer Neg Hx     Social History   Socioeconomic History   Marital status: Single    Spouse name: Not on file   Number of children: 0   Years of education: 14   Highest education level: Some college, no degree  Occupational History   Occupation: disability  Tobacco Use   Smoking status: Former    Years: 0.25    Types: Cigarettes    Quit date: 03/22/2019    Years since quitting: 3.0   Smokeless tobacco: Never  Vaping Use   Vaping Use: Never used  Substance and Sexual Activity   Alcohol use: Not Currently   Drug use: No    Comment: hx of marijuana/cocaine/crack use but sober since 2022-07-06   Sexual activity: Not Currently  Other Topics Concern   Not on file  Social History Narrative   05/08/2021 lives alone, sister [name] helps with meds, [name] has some in home care, lived with sister until Nov 2020   Caffeine- sodas, amount  varies   regular exercise-no   Social Determinants of Health   Financial Resource Strain: Low Risk  (01/31/2022)   Overall Financial Resource Strain (CARDIA)    Difficulty of Paying Living Expenses: Not very hard  Food Insecurity: No Food Insecurity (01/31/2022)   Hunger Vital Sign    Worried About Running Out of Food in the Last Year: Never true    Ran Out of Food in the Last Year: Never true  Transportation Needs: No Transportation Needs (01/31/2022)   PRAPARE - Hydrologist (Medical): No    Lack of Transportation (Non-Medical): No  Physical Activity: Insufficiently Active (01/31/2022)   Exercise Vital Sign    Days of Exercise per Week: 7 days    Minutes of Exercise per Session: 20 min  Stress: No Stress Concern  Present (01/31/2022)   [place]    Feeling of Stress : Not at all  Social Connections: Socially Isolated (01/31/2022)   Social Connection and Isolation Panel [NHANES]    Frequency of Communication with Friends and Family: More than three times a week    Frequency of Social Gatherings with Friends and Family: Never    Attends Religious Services: Never    Marine scientist or Organizations: No    Attends [employer]  or Organization Meetings: Never    Marital Status: Never married    Allergies  Allergen Reactions   Acetaminophen    Hydrocodone Bitartrate Er    Vicodin [Hydrocodone-Acetaminophen] Itching    Outpatient Medications Prior to Visit  Medication Sig Dispense Refill   ARIPiprazole (ABILIFY) 10 MG tablet Take 1 tablet (10 mg total) by mouth daily. 30 tablet 0   atorvastatin (LIPITOR) 10 MG tablet Take 1 tablet (10 mg total) by mouth daily. 90 tablet 3   Cholecalciferol (VITAMIN D-3) 125 MCG (5000 UT) TABS Take 125 mcg by mouth daily.     Continuous Blood Gluc Receiver (FREESTYLE LIBRE READER) DEVI Use as directed 1 each each   Continuous Blood Gluc Sensor (FREESTYLE LIBRE 2 SENSOR) MISC by Does not apply route.     Dulaglutide (TRULICITY) 6.22 WL/7.9GX SOPN Inject 0.75 mg into the skin once a week. 2 mL 3   glucose blood (ACCU-CHEK GUIDE) test strip CHECK SUGAR FOUR TIMES DAILY 100 each 12   insulin glargine (LANTUS) 100 UNIT/ML injection Inject 0.37 mLs (37 Units total) into the skin daily before breakfast.     insulin lispro (HUMALOG) 100 UNIT/ML injection 3 units with meals.  Do not take if you dont eat 10 mL 11   Insulin Pen Needle (B-D UF III MINI PEN NEEDLES) 31G X 5 MM MISC USE 4 TIMES DAILY 120 each 12   losartan (COZAAR) 25 MG tablet Take 25 mg by mouth daily.     magnesium oxide (MAG-OX) 400 (240 Mg) MG tablet Take 1 tablet (400 mg total) by mouth daily. 30 tablet 0   Multiple Vitamins-Minerals  (MULTIVITAMIN WITH MINERALS) tablet Take 1 tablet by mouth daily.     traZODone (DESYREL) 150 MG tablet Take 150 mg by mouth at bedtime.     fluticasone (FLONASE) 50 MCG/ACT nasal spray      pantoprazole (PROTONIX) 40 MG tablet Take 40 mg by mouth daily.     polyvinyl alcohol (LIQUIFILM TEARS) 1.4 % ophthalmic solution Place 1 drop into both eyes as needed for dry eyes. 15 mL 0   testosterone (ANDROGEL) 50 MG/5GM (1%) GEL Place 5 g onto the skin daily.     No facility-administered medications prior to visit.     ROS Review of Systems  Constitutional:  Negative for activity change and appetite change.  HENT:  Negative for sinus pressure and sore throat.   Respiratory:  Negative for chest tightness, shortness of breath and wheezing.   Cardiovascular:  Negative for chest pain and palpitations.  Gastrointestinal:  Negative for abdominal distention, abdominal pain and constipation.  Genitourinary: Negative.   Musculoskeletal:  Positive for back pain.  Psychiatric/Behavioral:  Negative for behavioral problems and dysphoric mood.    Objective:  BP 124/79   Pulse 93   Ht '6\' 4"'$  (1.93 m)   Wt 216 lb (98 kg)   SpO2 98%   BMI 26.29 kg/m      04/18/2022   10:46 AM 04/10/2022   10:37 AM 04/08/2022   11:27 AM  BP/Weight  Systolic BP 211 941 740  Diastolic BP 79 80 81  Wt. (Lbs) 216 216.8 216  BMI 26.29 kg/m2 26.39 kg/m2 26.29 kg/m2      Physical Exam Constitutional:      Appearance: [name] is well-developed.  Cardiovascular:     Rate and Rhythm: Normal rate.     Heart sounds: Normal heart sounds. No murmur heard. Pulmonary:     Effort: Pulmonary effort is normal.  Breath sounds: Normal breath sounds. No wheezing or rales.  Chest:     Chest wall: No tenderness.  Abdominal:     General: Bowel sounds are normal. There is no distension.     Palpations: Abdomen is soft. There is no mass.     Tenderness: There is no abdominal tenderness. There is no right CVA tenderness or left  CVA tenderness.  Musculoskeletal:        General: Normal range of motion.     Right lower leg: No edema.     Left lower leg: No edema.     Comments: Slight tenderness on palpation of right lower lumbar region  Neurological:     Mental Status: [name] is alert and oriented to person, place, and time.     Gait: Gait abnormal (ambulates witha walker).  Psychiatric:        Mood and Affect: Mood normal.        Latest Ref Rng & Units 02/10/2022    3:52 PM 01/03/2022    5:57 AM 12/31/2021    6:07 AM  CMP  Glucose 70 - 99 mg/dL 148  108  117   BUN 6 - 24 mg/dL '16  17  18   '$ Creatinine 0.76 - 1.27 mg/dL 1.21  1.16  1.29   Sodium 134 - 144 mmol/L 143  140  141   Potassium 3.5 - 5.2 mmol/L 5.2  3.6  4.1   Chloride 96 - 106 mmol/L 105  110  109   CO2 20 - 29 mmol/L '24  23  25   '$ Calcium 8.7 - 10.2 mg/dL 10.1  9.2  9.4   Total Protein 6.0 - 8.5 g/dL 7.3     Total Bilirubin 0.0 - 1.2 mg/dL 0.3     Alkaline Phos 44 - 121 IU/L 73     AST 0 - 40 IU/L 20     ALT 0 - 44 IU/L 23       Lipid Panel     Component Value Date/Time   CHOL 167 07/06/2020 1116   TRIG 323 (H) 07/06/2020 1116   HDL 39 (L) 07/06/2020 1116   CHOLHDL 4.3 07/06/2020 1116   CHOLHDL 7 06/02/2016 0844   VLDL 41 (H) 10/03/2015 0434   LDLCALC 76 07/06/2020 1116   LDLDIRECT 72.0 06/02/2016 0844    CBC    Component Value Date/Time   WBC 7.9 02/10/2022 1552   WBC 3.7 (L) 01/02/2022 0426   RBC 5.20 02/10/2022 1552   RBC 4.36 01/02/2022 0426   HGB 15.3 02/10/2022 1552   HCT 45.6 02/10/2022 1552   PLT 177 02/10/2022 1552   MCV 88 02/10/2022 1552   MCH 29.4 02/10/2022 1552   MCH 28.7 01/02/2022 0426   MCHC 33.6 02/10/2022 1552   MCHC 33.4 01/02/2022 0426   RDW 13.3 02/10/2022 1552   LYMPHSABS 1.3 12/15/2021 0659   LYMPHSABS 1.7 11/19/2021 1640   MONOABS 0.5 12/15/2021 0659   EOSABS 0.1 12/15/2021 0659   EOSABS 0.0 11/19/2021 1640   BASOSABS 0.0 12/15/2021 0659   BASOSABS 0.0 11/19/2021 1640    Lab Results   Component Value Date   HGBA1C 8.7 (A) 04/10/2022    Assessment & Plan:  1. Type 2 diabetes mellitus with stage 3a chronic kidney disease, with long-term current use of insulin (HCC) Uncontrolled with A1c of 8.7 Goal is less than 7.0 [name] had a visit with his endocrinologist last week Continue medications as per endocrine Counseled on Diabetic diet, my  plate method, 240 minutes of moderate intensity exercise/week Blood sugar logs with fasting goals of 80-120 mg/dl, random of less than 180 and in the event of sugars less than 60 mg/dl or greater than 400 mg/dl encouraged to notify the clinic. Advised on the need for annual eye exams, annual foot exams, Pneumonia vaccine.  2. Hyperlipidemia due to type 2 diabetes mellitus ([place]) LDL and total cholesterol at goal but [name] does have hypertriglyceridemia Lipid panel at next visit when [name] is fasting Continue statin Low-cholesterol diet  3. Testosterone deficiency Has been without his testosterone for some time Will not make any regimen changes if levels are low I have adjusted his testosterone dose as requested as [name] was previously on 1.62% but chart reveals 1% - PSA, total and free - Testosterone, Free, Total, SHBG  4. Degenerative disc disease, lumbar Stable Doing well off opioids Advised to apply heat or ice whichever is tolerated to painful areas. Counseled on evidence of improvement in pain control with regards to yoga, water aerobics, massage, home physical therapy, exercise as tolerated. Fall precautions - acetaminophen (TYLENOL) 500 MG tablet; Take 1 tablet (500 mg total) by mouth every 8 (eight) hours as needed.  Dispense: 90 tablet; Refill: 1  5. Muscle spasm His area of concern is in his right lower back and not really his abdomen Negative CVA tenderness Would love to send off a urine but [name] is unable to provide a specimen to evaluate for UTI Advised to use a heating pad and Tylenol since that provides relief and if symptoms  persist will consider workup for UTI  6. Benign prostatic hyperplasia with urinary retention Uncontrolled due to the fact that alfuzosin was discontinued Will restart this and if still symptomatic referred to urology - alfuzosin (UROXATRAL) 10 MG 24 hr tablet; Take 1 tablet (10 mg total) by mouth daily with breakfast.  Dispense: 90 tablet; Refill: 1  7. Gastroesophageal reflux disease without esophagitis Controlled - pantoprazole (PROTONIX) 40 MG tablet; Take 1 tablet (40 mg total) by mouth daily.  Dispense: 90 tablet; Refill: 1   Meds ordered this encounter  Medications   fluticasone (FLONASE) 50 MCG/ACT nasal spray    Sig: Place 1 spray into both nostrils daily.    Dispense:  16 g    Refill:  6   pantoprazole (PROTONIX) 40 MG tablet    Sig: Take 1 tablet (40 mg total) by mouth daily.    Dispense:  90 tablet    Refill:  1   alfuzosin (UROXATRAL) 10 MG 24 hr tablet    Sig: Take 1 tablet (10 mg total) by mouth daily with breakfast.    Dispense:  90 tablet    Refill:  1   polyvinyl alcohol (LIQUIFILM TEARS) 1.4 % ophthalmic solution    Sig: Place 1 drop into both eyes as needed for dry eyes.    Dispense:  15 mL    Refill:  1   acetaminophen (TYLENOL) 500 MG tablet    Sig: Take 1 tablet (500 mg total) by mouth every 8 (eight) hours as needed.    Dispense:  90 tablet    Refill:  1   DISCONTD: Testosterone 20.25 MG/1.25GM (1.62%) GEL    Sig: Apply topically once a day to upper arms    Dispense:  112.5 g    Refill:  1    90-day supply    Follow-up: Return in about 3 months (around 07/18/2022) for Chronic medical conditions.  [name], MD, FAAFP. [facility] [place], [name]   04/18/2022, 1:32 PM

## 2022-04-24 DIAGNOSIS — F250 Schizoaffective disorder, bipolar type: Secondary | ICD-10-CM | POA: Diagnosis not present

## 2022-04-28 ENCOUNTER — Encounter: Payer: Self-pay | Admitting: Internal Medicine

## 2022-04-28 DIAGNOSIS — E1122 Type 2 diabetes mellitus with diabetic chronic kidney disease: Secondary | ICD-10-CM

## 2022-04-28 LAB — TESTOSTERONE, FREE, TOTAL, SHBG
Sex Hormone Binding: 27 nmol/L (ref 19.3–76.4)
Testosterone, Free: 7 pg/mL — ABNORMAL LOW (ref 7.2–24.0)
Testosterone: 317 ng/dL (ref 264–916)

## 2022-04-28 LAB — PSA, TOTAL AND FREE
PSA, Free Pct: 35.9 %
PSA, Free: 1.04 ng/mL
Prostate Specific Ag, Serum: 2.9 ng/mL (ref 0.0–4.0)

## 2022-04-30 ENCOUNTER — Encounter: Payer: Self-pay | Admitting: Family Medicine

## 2022-04-30 MED ORDER — INSULIN GLARGINE 100 UNIT/ML ~~LOC~~ SOLN: 45.0000 [IU] | Freq: Every day | SUBCUTANEOUS | 1 refills | Status: DC

## 2022-04-30 MED ORDER — TRULICITY 0.75 MG/0.5ML ~~LOC~~ SOAJ: 0.7500 mg | SUBCUTANEOUS | 1 refills | Status: DC

## 2022-04-30 MED ORDER — HUMALOG KWIKPEN 200 UNIT/ML ~~LOC~~ SOPN: 15.0000 [IU] | PEN_INJECTOR | Freq: Three times a day (TID) | SUBCUTANEOUS | 1 refills | Status: DC

## 2022-05-01 ENCOUNTER — Other Ambulatory Visit: Payer: Self-pay | Admitting: Family Medicine

## 2022-05-01 DIAGNOSIS — E1169 Type 2 diabetes mellitus with other specified complication: Secondary | ICD-10-CM

## 2022-05-01 DIAGNOSIS — N401 Enlarged prostate with lower urinary tract symptoms: Secondary | ICD-10-CM

## 2022-05-01 DIAGNOSIS — M5136 Other intervertebral disc degeneration, lumbar region: Secondary | ICD-10-CM

## 2022-05-01 DIAGNOSIS — K219 Gastro-esophageal reflux disease without esophagitis: Secondary | ICD-10-CM

## 2022-05-01 MED ORDER — FLUTICASONE PROPIONATE 50 MCG/ACT NA SUSP: 1.0000 | Freq: Every day | NASAL | 6 refills | Status: DC

## 2022-05-01 MED ORDER — PANTOPRAZOLE SODIUM 40 MG PO TBEC: 40.0000 mg | DELAYED_RELEASE_TABLET | Freq: Every day | ORAL | 1 refills | Status: DC

## 2022-05-01 MED ORDER — ACETAMINOPHEN 500 MG PO TABS: 500.0000 mg | ORAL_TABLET | Freq: Three times a day (TID) | ORAL | 1 refills | Status: AC | PRN

## 2022-05-01 MED ORDER — TESTOSTERONE 20.25 MG/1.25GM (1.62%) TD GEL: TRANSDERMAL | 1 refills | Status: DC

## 2022-05-01 MED ORDER — ATORVASTATIN CALCIUM 10 MG PO TABS: 10.0000 mg | ORAL_TABLET | Freq: Every day | ORAL | 3 refills | Status: DC

## 2022-05-01 MED ORDER — POLYVINYL ALCOHOL 1.4 % OP SOLN: 1.0000 [drp] | OPHTHALMIC | 1 refills | Status: DC | PRN

## 2022-05-01 MED ORDER — ALFUZOSIN HCL ER 10 MG PO TB24: 10.0000 mg | ORAL_TABLET | Freq: Every day | ORAL | 1 refills | Status: DC

## 2022-05-07 DIAGNOSIS — F4310 Post-traumatic stress disorder, unspecified: Secondary | ICD-10-CM | POA: Diagnosis not present

## 2022-05-13 DIAGNOSIS — F332 Major depressive disorder, recurrent severe without psychotic features: Secondary | ICD-10-CM | POA: Diagnosis not present

## 2022-05-13 DIAGNOSIS — F419 Anxiety disorder, unspecified: Secondary | ICD-10-CM | POA: Diagnosis not present

## 2022-05-14 ENCOUNTER — Ambulatory Visit: Payer: Medicare HMO | Admitting: Podiatry

## 2022-05-14 ENCOUNTER — Ambulatory Visit: Payer: Medicare Other | Admitting: Podiatry

## 2022-05-14 VITALS — BP 145/95

## 2022-05-14 DIAGNOSIS — M79674 Pain in right toe(s): Secondary | ICD-10-CM | POA: Diagnosis not present

## 2022-05-14 DIAGNOSIS — B351 Tinea unguium: Secondary | ICD-10-CM

## 2022-05-14 DIAGNOSIS — E1142 Type 2 diabetes mellitus with diabetic polyneuropathy: Secondary | ICD-10-CM | POA: Diagnosis not present

## 2022-05-14 DIAGNOSIS — M79675 Pain in left toe(s): Secondary | ICD-10-CM | POA: Diagnosis not present

## 2022-05-14 DIAGNOSIS — L84 Corns and callosities: Secondary | ICD-10-CM

## 2022-05-14 NOTE — Progress Notes (Signed)
  Subjective:  Patient ID: Jonathan Meckel., male    DOB: 1965/06/07,  MRN: [id number]  Jonathan Reel Calahan Pak. presents to clinic today for at risk foot care with history of diabetic neuropathy and callus(es) left lower extremity and painful thick toenails that are difficult to trim. Painful toenails interfere with ambulation. Aggravating factors include wearing enclosed shoe gear. Pain is relieved with periodic professional debridement. Painful calluses are aggravated when weightbearing with and without shoegear. Pain is relieved with periodic professional debridement.  Chief Complaint  Patient presents with   Nail Problem    [facility] BS-113 A1C-9.7 PCP-[place] PCP VST-04/18/2022   New problem(s): None.   He has purchased new [employer] shoes.  PCP is [name], MD.  Allergies  Allergen Reactions   Acetaminophen    Hydrocodone Bitartrate Er    Vicodin [Hydrocodone-Acetaminophen] Itching    Review of Systems: Negative except as noted in the HPI.  Objective: Vitals:   05/14/22 1433  BP: (!) 145/95   Jonathan Reel Tarron Krolak. is a pleasant 57 y.o. male WD, WN in NAD. AAO x 3.  Vascular Examination: Capillary refill time immediate b/l.Vascular status intact b/l with palpable pedal pulses. Pedal hair sparse b/l. No edema. No pain with calf compression b/l. Skin temperature gradient WNL b/l. No ischemia or gangrene noted b/l LE. No cyanosis or clubbing noted b/l LE.  Neurological Examination: Pt has subjective symptoms of neuropathy. Protective sensation diminished with 10g monofilament b/l.  Dermatological Examination: Resolving area of pressure noted posterolateral aspect of both heels b/l with tenderness to palpation. Skin peeling. Area with blanching erythema. No blister formation, no eschar.   Pedal skin thin and atrophic b/l LE. No open wounds b/l LE. No interdigital macerations noted b/l LE.   Toenails 1-5 b/l elongated, discolored, dystrophic, thickened, crumbly with  subungual debris and tenderness to dorsal palpation.   Resolved hyperkeratotic lesion(s) submet head 1 left foot.    Musculoskeletal Examination: Muscle strength 5/5 to all lower extremity muscle groups bilaterally. Hammertoe deformity noted 2-5 b/l. Utilizes rollator for ambulation assistance.  Radiographs: None  Assessment/Plan: 1. Pain due to onychomycosis of toenails of both feet   2. Diabetic peripheral neuropathy associated with type 2 diabetes mellitus (HCC)     No orders of the defined types were placed in this encounter.  -Consent given for treatment as described below: -Examined patient. -Callus left foot has resolved. -Continue foot and shoe inspections daily. Monitor blood glucose per PCP/Endocrinologist's recommendations. -Continue supportive shoe gear daily. -Mycotic toenails 1-5 bilaterally were debrided in length and girth with sterile nail nippers and dremel without incident. -Patient/POA to call should there be question/concern in the interim.   No follow-ups on file.  Marzetta Board, DPM

## 2022-05-18 ENCOUNTER — Encounter: Payer: Self-pay | Admitting: Podiatry

## 2022-05-20 DIAGNOSIS — F250 Schizoaffective disorder, bipolar type: Secondary | ICD-10-CM | POA: Diagnosis not present

## 2022-06-03 DIAGNOSIS — F250 Schizoaffective disorder, bipolar type: Secondary | ICD-10-CM | POA: Diagnosis not present

## 2022-06-17 DIAGNOSIS — F4310 Post-traumatic stress disorder, unspecified: Secondary | ICD-10-CM | POA: Diagnosis not present

## 2022-06-25 ENCOUNTER — Ambulatory Visit: Payer: Medicaid Other | Admitting: Family Medicine

## 2022-07-01 DIAGNOSIS — F4310 Post-traumatic stress disorder, unspecified: Secondary | ICD-10-CM | POA: Diagnosis not present

## 2022-07-06 ENCOUNTER — Other Ambulatory Visit: Payer: Self-pay | Admitting: Internal Medicine

## 2022-07-06 DIAGNOSIS — E1122 Type 2 diabetes mellitus with diabetic chronic kidney disease: Secondary | ICD-10-CM

## 2022-07-08 DIAGNOSIS — F419 Anxiety disorder, unspecified: Secondary | ICD-10-CM | POA: Diagnosis not present

## 2022-07-08 DIAGNOSIS — F331 Major depressive disorder, recurrent, moderate: Secondary | ICD-10-CM | POA: Diagnosis not present

## 2022-07-15 DIAGNOSIS — F4310 Post-traumatic stress disorder, unspecified: Secondary | ICD-10-CM | POA: Diagnosis not present

## 2022-07-16 ENCOUNTER — Encounter: Payer: Self-pay | Admitting: Internal Medicine

## 2022-07-16 ENCOUNTER — Ambulatory Visit (INDEPENDENT_AMBULATORY_CARE_PROVIDER_SITE_OTHER): Payer: Medicare HMO | Admitting: Internal Medicine

## 2022-07-16 VITALS — BP 132/84 | HR 90 | Ht 76.0 in | Wt 223.0 lb

## 2022-07-16 DIAGNOSIS — Z794 Long term (current) use of insulin: Secondary | ICD-10-CM

## 2022-07-16 DIAGNOSIS — E785 Hyperlipidemia, unspecified: Secondary | ICD-10-CM | POA: Diagnosis not present

## 2022-07-16 DIAGNOSIS — E1122 Type 2 diabetes mellitus with diabetic chronic kidney disease: Secondary | ICD-10-CM

## 2022-07-16 DIAGNOSIS — N184 Chronic kidney disease, stage 4 (severe): Secondary | ICD-10-CM | POA: Diagnosis not present

## 2022-07-16 LAB — POCT GLYCOSYLATED HEMOGLOBIN (HGB A1C): Hemoglobin A1C: 7.3 % — AB (ref 4.0–5.6)

## 2022-07-16 LAB — LIPID PANEL
Cholesterol: 139 mg/dL (ref 0–200)
HDL: 43.2 mg/dL (ref >39.00)
NonHDL: 95.58
Total CHOL/HDL Ratio: 3
Triglycerides: 285 mg/dL — ABNORMAL HIGH (ref 0.0–149.0)
VLDL: 57 mg/dL — ABNORMAL HIGH (ref 0.0–40.0)

## 2022-07-16 LAB — LDL CHOLESTEROL, DIRECT: Direct LDL: 62 mg/dL

## 2022-07-16 NOTE — Patient Instructions (Addendum)
Please change: - Lantus 20 units before b'fast and 20 units at bedtime  Change: Humalog U200:  - 15 units before protein shake in am - 18-20 units before dinner  Continue: - Trulicity [id number] mg weekly  Please come back for a follow-up appointment in 3-4 months.

## 2022-07-16 NOTE — Progress Notes (Unsigned)
Patient ID: [name]., male   DOB: September 29, 1965, 57 y.o.   MRN: [id number]  HPI: [name]. is a 57 y.o.-year-old male, returning for follow-up for DM2, dx in 2012, insulin-dependent since 2017, uncontrolled, with complications (stage 4 CKD, PN, fatty liver). Last OV 3.5 mo ago.  Interim hx: He has increased urination, no blurry vision, nausea, chest pain.  Reviewed HbA1c: Lab Results  Component Value Date   HGBA1C 8.7 (A) 04/10/2022   HGBA1C 8.0 (H) 12/02/2021   HGBA1C 9.4 (A) 09/02/2021   HGBA1C 7.8 (A) 06/11/2021   HGBA1C 7.1 (H) 05/27/2021   HGBA1C 8.1 (A) 11/14/2020   HGBA1C 8.0 (A) 08/30/2020   HGBA1C 7.5 (H) 07/06/2020   HGBA1C 6.4 05/11/2020   HGBA1C 7.9 (H) 01/03/2020   Pt was on a regimen of: - NPH 45 units in a.m. Prev. On Lantus,  Levemir, NovoLog, Humalog, Tradjenta,Metformin.  Now on: - Lantus 27 units daily (decreased 12/2021 after hospitalization) >> 37 >> 50 units in am - Humalog U200 3 >> 10-20 >> 15-20 >> 20 units 15 min before each meal - Trulicity A999333 mg weekly  Pt checks his sugars >4x a day with the Libre CGM - from [facility]:  Previously:  Prev.:   Lowest sugar was 60s >> 60s >> 65; he has hypoglycemia awareness at 70.  Highest sugar was 400s >> HI >> upper 200s.  Glucometer: Accu-Chek guide  Meals: - Protein shake (20 units) - Lunch  - skips - Dinner - potato cheddar bake (15 units)  - + CKD, last BUN/creatinine:  Lab Results  Component Value Date   BUN 16 02/10/2022   BUN 17 01/03/2022   CREATININE 1.21 02/10/2022   CREATININE 1.16 01/03/2022  On lisinopril.  - + HL; last set of lipids: Lab Results  Component Value Date   CHOL 167 07/06/2020   HDL 39 (L) 07/06/2020   LDLCALC 76 07/06/2020   LDLDIRECT 72.0 06/02/2016   TRIG 323 (H) 07/06/2020   CHOLHDL 4.3 07/06/2020  On Lipitor 10 mg daily - last eye exam was in  09/2021. No DR reportedly.   - no numbness and tingling in his feet.  On Neurontin 300  mg twice a day.  Last foot exam 04/2022 - Dr Elisha Ponder.  He also has a history of hypogonadism, HTN, back pain, bipolar disorder, PTSD, history of suicide attempt. He undergoes ECT treatments. He has a history of schizoaffective disorder and also poor memory.  He has an aide that helps him.  He gives his own insulin. He was admitted 11/2021 for pneumonia.  In 2023, he had a thyroid ultrasound showing 3 nodules, of which do need to be followed with annual ultrasounds.  This is managed by PCP.  ROS: + see HPI  Past Medical History:  Diagnosis Date   ADD (attention deficit disorder)    Anxiety    Arthritis    right hip   Bipolar 1 disorder (HCC)    Blood in urine    CKD (chronic kidney disease), stage III (HCC)    Creatinine elevation    Dementia ([place])    "early onset" (08/04/2017)   Depression    bipolar [facility]   Diabetes mellitus without complication ([place])    Dizziness    Family history of anesthesia complication    pt is unsure , but pt father may have been difficult to arouse    Frequent falls    HCAP (healthcare-associated pneumonia) 10/31/2012   History  of kidney stones    Hypertension    Hypogonadism male    Liver fatty degeneration    Lumbar radiculopathy    Microscopic hematuria    hereditary s/p Urology eval   Neuromuscular disorder ([place])    feet neuropathy    Osteoarthritis of right hip 11/28/2011   2012 2015 s/p THR Severe  Dr Novella Olive     Pleural effusion 11/02/2012   Pneumonia 10/2012   Pneumonia, organism unspecified(486) 11/02/2012   Polysubstance dependence, non-opioid, in remission ([place])    remote   Primary osteoarthritis of left hip 05/22/2015   PTSD (post-traumatic stress disorder)    SOCIAL ANXIETY DISORDER    Schizoaffective disorder ([place])    Substance abuse ([place])    Suicide attempt by multiple drug overdose ([place]) 2016-01-23   Grieving his cat's death Aug 17, 2015   Past Surgical History:  Procedure Laterality Date   BACK SURGERY     CLOSED  REDUCTION METACARPAL WITH PERCUTANEOUS PINNING Right    LUMBAR [place] HIP ARTHROPLASTY Right 08/16/2013   Procedure: TOTAL HIP ARTHROPLASTY ANTERIOR APPROACH;  Surgeon: Hessie Dibble, MD;  Location: [place];  Service: Orthopedics;  Laterality: Right;   TOTAL HIP ARTHROPLASTY Left 05/22/2015   Procedure: TOTAL HIP ARTHROPLASTY ANTERIOR APPROACH;  Surgeon: Melrose Nakayama, MD;  Location: [place];  Service: Orthopedics;  Laterality: Left;   Social History   Socioeconomic History   Marital status: Single    Spouse name: Not on file   Number of children: 0   Years of education: 14   Highest education level: Some college, no degree  Occupational History   Occupation: disability  Tobacco Use   Smoking status: Former    Years: .25    Types: Cigarettes    Quit date: 03/22/2019    Years since quitting: 3.3   Smokeless tobacco: Never  Vaping Use   Vaping Use: Never used  Substance and Sexual Activity   Alcohol use: Not Currently   Drug use: No    Comment: hx of marijuana/cocaine/crack use but sober since 2022-08-17   Sexual activity: Not Currently  Other Topics Concern   Not on file  Social History Narrative   05/08/2021 lives alone, sister [name] helps with meds, he has some in home care, lived with sister until Nov 2020   Caffeine- sodas, amount  varies   regular exercise-no   Social Determinants of Health   Financial Resource Strain: Low Risk  (01/31/2022)   Overall Financial Resource Strain (CARDIA)    Difficulty of Paying Living Expenses: Not very hard  Food Insecurity: No Food Insecurity (01/31/2022)   Hunger Vital Sign    Worried About Running Out of Food in the Last Year: Never true    [place] in the Last Year: Never true  Transportation Needs: No Transportation Needs (01/31/2022)   PRAPARE - Hydrologist (Medical): No    Lack of Transportation (Non-Medical): No  Physical Activity: Insufficiently Active  (01/31/2022)   Exercise Vital Sign    Days of Exercise per Week: 7 days    Minutes of Exercise per Session: 20 min  Stress: No Stress Concern Present (01/31/2022)   [place]    Feeling of Stress : Not at all  Social Connections: Socially Isolated (01/31/2022)   Social Connection and Isolation Panel [NHANES]    Frequency of Communication with Friends and Family:  More than three times a week    Frequency of Social Gatherings with Friends and Family: Never    Attends Religious Services: Never    Marine scientist or Organizations: No    Attends Archivist Meetings: Never    Marital Status: Never married  Intimate Partner Violence: Not At Risk (01/31/2022)   Humiliation, Afraid, Rape, and Kick questionnaire    Fear of Current or Ex-Partner: No    Emotionally Abused: No    Physically Abused: No    Sexually Abused: No   Current Outpatient Medications on File Prior to Visit  Medication Sig Dispense Refill   acetaminophen (TYLENOL) 500 MG tablet Take 1 tablet (500 mg total) by mouth every 8 (eight) hours as needed. 90 tablet 1   alfuzosin (UROXATRAL) 10 MG 24 hr tablet Take 1 tablet (10 mg total) by mouth daily with breakfast. 90 tablet 1   ARIPiprazole (ABILIFY) 10 MG tablet Take 1 tablet (10 mg total) by mouth daily. 30 tablet 0   atorvastatin (LIPITOR) 10 MG tablet Take 1 tablet (10 mg total) by mouth daily. 90 tablet 3   Cholecalciferol (VITAMIN D-3) 125 MCG (5000 UT) TABS Take 125 mcg by mouth daily.     Continuous Blood Gluc Receiver (FREESTYLE LIBRE READER) DEVI Use as directed 1 each each   Continuous Blood Gluc Sensor (FREESTYLE LIBRE 2 SENSOR) MISC by Does not apply route.     Dulaglutide (TRULICITY) A999333 0000000 SOPN Inject 0.75 mg into the skin once a week. 6 mL 1   fluticasone (FLONASE) 50 MCG/ACT nasal spray Place 1 spray into both nostrils daily. 16 g 6   glucose blood (ACCU-CHEK GUIDE) test  strip CHECK SUGAR FOUR TIMES DAILY 100 each 12   insulin glargine (LANTUS SOLOSTAR) 100 UNIT/ML Solostar Pen Lantus 37 units before b'fast (may increase by 4 units every 4 days if sugars in am are not <130) 45 mL 1   insulin lispro (HUMALOG KWIKPEN) 200 UNIT/ML KwikPen Inject 15-20 Units into the skin 3 (three) times daily before meals. 60 mL 1   Insulin Pen Needle (B-D UF III MINI PEN NEEDLES) 31G X 5 MM MISC USE 4 TIMES DAILY 120 each 12   losartan (COZAAR) 25 MG tablet Take 25 mg by mouth daily.     magnesium oxide (MAG-OX) 400 (240 Mg) MG tablet Take 1 tablet (400 mg total) by mouth daily. 30 tablet 0   Multiple Vitamins-Minerals (MULTIVITAMIN WITH MINERALS) tablet Take 1 tablet by mouth daily.     pantoprazole (PROTONIX) 40 MG tablet Take 1 tablet (40 mg total) by mouth daily. 90 tablet 1   polyvinyl alcohol (LIQUIFILM TEARS) 1.4 % ophthalmic solution Place 1 drop into both eyes as needed for dry eyes. 15 mL 1   Testosterone 20.25 MG/1.25GM (1.62%) GEL Apply topically once a day to upper arms 112.5 g 1   traZODone (DESYREL) 150 MG tablet Take 150 mg by mouth at bedtime.     No current facility-administered medications on file prior to visit.   Allergies  Allergen Reactions   Acetaminophen    Hydrocodone Bitartrate Er    Vicodin [Hydrocodone-Acetaminophen] Itching   Family History  Problem Relation Age of Onset   Diabetes Father    Cancer Mother        died of melanoma with mets   Cervical cancer Sister    Diabetes Sister    Other Neg Hx        hypogonadism   Colon  cancer Neg Hx    Colon polyps Neg Hx    Esophageal cancer Neg Hx    Rectal cancer Neg Hx    Stomach cancer Neg Hx    PE: There were no vitals taken for this visit. Wt Readings from Last 3 Encounters:  04/18/22 216 lb (98 kg)  04/10/22 216 lb 12.8 oz (98.3 kg)  04/08/22 216 lb (98 kg)   Constitutional: overweight, in NAD, walks with a walker Eyes:  EOMI, no exophthalmos ENT: no neck masses, no cervical  lymphadenopathy Cardiovascular: RRR, No MRG Respiratory: CTA B Musculoskeletal: no deformities Skin:no rashes Neurological: no tremor with outstretched hands  ASSESSMENT: 1. DM2, insulin-dependent, uncontrolled, with complications - CKD - PN - fatty liver  2. HL  PLAN:  1. Patient with longstanding, uncontrolled, type 2 diabetes, on basal/bolus insulin regimen to which we added a low-dose weekly GLP-1 receptor agonist.  At that time, HbA1c was higher, at 8.7% and sugars are fluctuating entirely above the target range, between 180-250, with occasional higher blood sugars after dinner but with overall increased variability after this meal.  I advised him to increase the Lantus dose and to vary the dose of Humalog based on the size and consistency of the meals.  I also advised him to take Humalog 15 minutes before each meal as he was taking it at the start of the meals.  I recommended to start Trulicity, since Ozempic was on backorder. CGM interpretation: -At today's visit, we reviewed his CGM downloads: It appears that 49% of values are in target range (goal >70%), while 51% are higher than 180 (goal <25%), and 0% are lower than 70 (goal <4%).  The calculated average blood sugar is 186.  The projected HbA1c for the next 3 months ([facility]MI) is 7.8%. -Reviewing the CGM trends, sugars appear to be high overnight, increasing after dinner, and then dropping abruptly after breakfast.  Upon questioning, he has 2 meals per day, of which breakfast is just a protein shake for which he takes 25 units of Humalog U200.  I advised him to decrease this to only 15 units.  Before dinner, says this is his only solid meal of the day, I advised him to take Humalog.  Since sugars are increasing overnight and decreasing during the day, I advised him to split the Lantus dose into 2 doses taken approximately 12 hours apart.  We also discussed about possibly increasing the Trulicity dose but he has eructations and gas so for now  we will keep the same dose.  I did suggest Mylanta, but he mentions that these are not so bad as to require treatment. - I suggested to:  Patient Instructions  Please change: - Lantus 20 units before b'fast and 20 units at bedtime  Change: Humalog U200:  - 15 units before protein shake in am - 18-20 units before dinner  Continue: - Trulicity A999333 mg weekly  Please come back for a follow-up appointment in 3-4 months.   - we checked his HbA1c: 7.3% (improved) - advised to check sugars at different times of the day - 4x a day, rotating check times - advised for yearly eye exams >> he is UTD - return to clinic in 3-4 months  2. HL -Reviewed the latest lipid panel from 06/2020: LDL at goal, TriStart high, HDL low: Lab Results  Component Value Date   CHOL 167 07/06/2020   HDL 39 (L) 07/06/2020   LDLCALC 76 07/06/2020   LDLDIRECT 72.0 06/02/2016   TRIG  323 (H) 07/06/2020   CHOLHDL 4.3 07/06/2020  -He continues on Lipitor 10 mg daily without side effects.  -Will check another lipid panel today.  He had protein shake this morning.  Philemon Kingdom, MD PhD [facility] End[facility]in[facility]

## 2022-07-17 DIAGNOSIS — E1122 Type 2 diabetes mellitus with diabetic chronic kidney disease: Secondary | ICD-10-CM | POA: Diagnosis not present

## 2022-07-29 DIAGNOSIS — F4310 Post-traumatic stress disorder, unspecified: Secondary | ICD-10-CM | POA: Diagnosis not present

## 2022-08-12 DIAGNOSIS — F4310 Post-traumatic stress disorder, unspecified: Secondary | ICD-10-CM | POA: Diagnosis not present

## 2022-08-24 ENCOUNTER — Other Ambulatory Visit: Payer: Self-pay | Admitting: Internal Medicine

## 2022-08-24 DIAGNOSIS — N184 Chronic kidney disease, stage 4 (severe): Secondary | ICD-10-CM

## 2022-08-24 DIAGNOSIS — E1122 Type 2 diabetes mellitus with diabetic chronic kidney disease: Secondary | ICD-10-CM

## 2022-08-26 ENCOUNTER — Encounter: Payer: Self-pay | Admitting: Podiatry

## 2022-08-26 ENCOUNTER — Ambulatory Visit (INDEPENDENT_AMBULATORY_CARE_PROVIDER_SITE_OTHER): Payer: Medicare HMO | Admitting: Podiatry

## 2022-08-26 DIAGNOSIS — F250 Schizoaffective disorder, bipolar type: Secondary | ICD-10-CM | POA: Diagnosis not present

## 2022-08-26 DIAGNOSIS — M79674 Pain in right toe(s): Secondary | ICD-10-CM | POA: Diagnosis not present

## 2022-08-26 DIAGNOSIS — B351 Tinea unguium: Secondary | ICD-10-CM | POA: Diagnosis not present

## 2022-08-26 DIAGNOSIS — M79675 Pain in left toe(s): Secondary | ICD-10-CM

## 2022-08-26 DIAGNOSIS — E1142 Type 2 diabetes mellitus with diabetic polyneuropathy: Secondary | ICD-10-CM

## 2022-08-26 NOTE — Progress Notes (Signed)
  Subjective:  Patient ID: Jonathan Clark., male    DOB: [birth year],  MRN: [id number]  Jonathan Arabia Cady Toal. presents to clinic today for at risk foot care with history of diabetic neuropathy  Chief Complaint  Patient presents with   Diabetes    [facility] BS - 167 A1C - 7.6 LVPCP - 02/2022 HAS AN APPT ON 09/17/22   New problem(s): None.   PCP is Hoy Register, MD.  No Active Allergies  Review of Systems: Negative except as noted in the HPI.  Objective: No changes noted in today's physical examination. There were no vitals filed for this visit. Jonathan Yano Jatorian Kauk. is a pleasant 57 y.o. male in NAD. AAO x 3.  Vascular Examination: Capillary refill time immediate b/l.Vascular status intact b/l with palpable pedal pulses. Pedal hair sparse b/l. No edema. No pain with calf compression b/l. Skin temperature gradient WNL b/l. No ischemia or gangrene noted b/l LE. No cyanosis or clubbing noted b/l LE.  Neurological Examination: Pt has subjective symptoms of neuropathy. Protective sensation diminished with 10g monofilament b/l.  Dermatological Examination: Resolving area of pressure noted posterolateral aspect of both heels b/l with tenderness to palpation. Skin peeling. Area with blanching erythema. No blister formation, no eschar.   Pedal skin thin and atrophic b/l LE. No open wounds b/l LE. No interdigital macerations noted b/l LE.   Toenails 1-5 b/l elongated, discolored, dystrophic, thickened, crumbly with subungual debris and tenderness to dorsal palpation.   No hyperkeratotic nor porokeratotic lesions present on today's visit.  Musculoskeletal Examination: Muscle strength 5/5 to all lower extremity muscle groups bilaterally. Hammertoe deformity noted 2-5 b/l. Utilizes rollator for ambulation assistance.  Radiographs: None  Assessment/Plan: 1. Pain due to onychomycosis of toenails of both feet   2. Diabetic peripheral neuropathy associated with type 2 diabetes mellitus  (HCC)    -Patient was evaluated and treated. All patient's and/or POA's questions/concerns answered on today's visit. -Patient to continue soft, supportive shoe gear daily. -Toenails 1-5 b/l were debrided in length and girth with sterile nail nippers and dremel without iatrogenic bleeding.  -Patient/POA to call should there be question/concern in the interim.   Return in about 3 months (around 11/26/2022).  Freddie Breech, DPM

## 2022-09-01 ENCOUNTER — Encounter: Payer: Self-pay | Admitting: Internal Medicine

## 2022-09-02 ENCOUNTER — Other Ambulatory Visit: Payer: Self-pay | Admitting: Internal Medicine

## 2022-09-02 DIAGNOSIS — F332 Major depressive disorder, recurrent severe without psychotic features: Secondary | ICD-10-CM | POA: Diagnosis not present

## 2022-09-02 DIAGNOSIS — F419 Anxiety disorder, unspecified: Secondary | ICD-10-CM | POA: Diagnosis not present

## 2022-09-02 MED ORDER — TRULICITY 1.5 MG/0.5ML ~~LOC~~ SOAJ: 1.5000 mg | SUBCUTANEOUS | 3 refills | Status: DC

## 2022-09-09 DIAGNOSIS — F4310 Post-traumatic stress disorder, unspecified: Secondary | ICD-10-CM | POA: Diagnosis not present

## 2022-09-18 ENCOUNTER — Ambulatory Visit: Payer: Medicare HMO | Attending: Family Medicine | Admitting: Family Medicine

## 2022-09-18 ENCOUNTER — Encounter: Payer: Self-pay | Admitting: Family Medicine

## 2022-09-18 VITALS — BP 144/84 | HR 99 | Ht 76.0 in | Wt 213.0 lb

## 2022-09-18 DIAGNOSIS — N509 Disorder of male genital organs, unspecified: Secondary | ICD-10-CM | POA: Diagnosis not present

## 2022-09-18 DIAGNOSIS — N401 Enlarged prostate with lower urinary tract symptoms: Secondary | ICD-10-CM | POA: Diagnosis not present

## 2022-09-18 DIAGNOSIS — K219 Gastro-esophageal reflux disease without esophagitis: Secondary | ICD-10-CM | POA: Diagnosis not present

## 2022-09-18 DIAGNOSIS — L89600 Pressure ulcer of unspecified heel, unstageable: Secondary | ICD-10-CM | POA: Insufficient documentation

## 2022-09-18 DIAGNOSIS — D61818 Other pancytopenia: Secondary | ICD-10-CM | POA: Insufficient documentation

## 2022-09-18 DIAGNOSIS — R338 Other retention of urine: Secondary | ICD-10-CM

## 2022-09-18 DIAGNOSIS — E781 Pure hyperglyceridemia: Secondary | ICD-10-CM

## 2022-09-18 DIAGNOSIS — E1149 Type 2 diabetes mellitus with other diabetic neurological complication: Secondary | ICD-10-CM | POA: Diagnosis not present

## 2022-09-18 DIAGNOSIS — F17210 Nicotine dependence, cigarettes, uncomplicated: Secondary | ICD-10-CM

## 2022-09-18 DIAGNOSIS — N1831 Chronic kidney disease, stage 3a: Secondary | ICD-10-CM

## 2022-09-18 DIAGNOSIS — R21 Rash and other nonspecific skin eruption: Secondary | ICD-10-CM

## 2022-09-18 DIAGNOSIS — Z7985 Long-term (current) use of injectable non-insulin antidiabetic drugs: Secondary | ICD-10-CM

## 2022-09-18 DIAGNOSIS — E1122 Type 2 diabetes mellitus with diabetic chronic kidney disease: Secondary | ICD-10-CM | POA: Diagnosis not present

## 2022-09-18 DIAGNOSIS — F250 Schizoaffective disorder, bipolar type: Secondary | ICD-10-CM

## 2022-09-18 DIAGNOSIS — R270 Ataxia, unspecified: Secondary | ICD-10-CM | POA: Diagnosis not present

## 2022-09-18 DIAGNOSIS — Z794 Long term (current) use of insulin: Secondary | ICD-10-CM

## 2022-09-18 MED ORDER — ALFUZOSIN HCL ER 10 MG PO TB24: 10.0000 mg | ORAL_TABLET | Freq: Every day | ORAL | 1 refills | Status: DC

## 2022-09-18 MED ORDER — FENOFIBRATE 48 MG PO TABS
48.0000 mg | ORAL_TABLET | Freq: Every day | ORAL | 1 refills | Status: DC
Start: 2022-09-18 — End: 2023-02-05

## 2022-09-18 MED ORDER — BD PEN NEEDLE MINI U/F 31G X 5 MM MISC: 12 refills | Status: DC

## 2022-09-18 MED ORDER — PANTOPRAZOLE SODIUM 40 MG PO TBEC: 40.0000 mg | DELAYED_RELEASE_TABLET | Freq: Every day | ORAL | 1 refills | Status: DC

## 2022-09-18 NOTE — Patient Instructions (Signed)
Diabetes Mellitus and Skin Care Diabetes, also called diabetes mellitus, can lead to skin problems. If blood sugar (glucose) is not well controlled, it can cause problems over time. These problems include: Damage to nerves. This can affect your ability to feel wounds. This means you may not notice small skin injuries that could lead to bigger problems. This can also decrease the amount that you sweat, causing dry skin. Damage to blood vessels. The lack of blood flow can cause skin to break down. It can also slow healing time, which can lead to infections. Areas of skin that become thick or discolored. Common skin conditions There are certain skin conditions that often affect people with diabetes. These include: Dry skin. Thin skin. The skin on the feet may get thinner, break more easily, and heal more slowly than normal. Skin infections from bacteria. These include: Styes. These are infections near the eyelid. Boils. These are bumps filled with pus. Infected hair follicles. Infections of the skin around the nails. Fungal skin infections. These are most common in areas where skin rubs together, such as in the armpits or under the breasts. Common skin changes Diabetes can also cause the skin to change. You may develop: Dark, velvety markings on your skin. These may appear on your face, neck, armpits, inner thighs, and groin. Red, raised, scar-like tissue that may itch, feel painful, or become a wound. Blisters on your feet, toes, hands, or fingers. Thick, wax-like areas of skin. In most cases, these occur on the hands, forehead, or toes. Brown or red, ring-shaped or half-ring-shaped patches of skin on the ears or fingers. Pea-shaped, yellow bumps that may be itchy and have a red ring around them. This may affect your arms, feet, buttocks, and the top of your hands. Round, discolored patches of tan skin that do not hurt or itch. These may look like age spots. Supplies needed: Mild soap or  gentle skin cleanser. Lotion. How to care for dry, itchy skin Frequent high glucose levels can cause skin to become itchy. Poor blood circulation and skin infections can make dry skin worse. If you have dry, itchy skin: Avoid very hot showers and baths. Use mild soap and gentle skin cleansers. Do not use soap that is perfumed, harsh, or that dries your skin. Moisturizing soaps may help. Put on moisturizing lotion as soon as you finish bathing. Do not scratch dry skin. Scratching can expose skin to infection. If you have a rash or if your skin is very itchy, contact your health care provider. Skin that is red or covered in a rash may be a sign of an allergic reaction. Very itchy skin may mean that you need help to manage your diabetes better. You may also need treatment for an infection. General tips Most skin problems can be prevented or treated easily if caught early. Talk with your health care provider if you have any concerns. General tips include: Check your skin every day for cuts, bruises, redness, blisters, or sores, especially on your feet. If you cannot see the bottom of your feet, use a mirror or ask someone for help. Tell your health care provider if you have any of these injuries and if they are healing slowly. Keep your skin clean and dry. Do not use hot water. Moisturize your skin to prevent chapping. Keep your blood glucose levels within target range. Follow these instructions at home:  Take over-the-counter and prescription medicines only as told by your health care provider. This includes all diabetes medicines  you are taking. Schedule a foot exam with your health care provider once a year. During the exam, the structure and skin of your feet will be checked for problems. Make sure that your health care provider does a visual foot exam at every visit. If you get a skin injury, such as a cut, blister, or sore, check the area every day for signs of infection. Check for: Redness,  swelling, or pain. Fluid or blood. Warmth. Pus or a bad smell. Do not use any products that contain nicotine or tobacco. These products include cigarettes, chewing tobacco, and vaping devices, such as e-cigarettes. If you need help quitting, ask your health care provider. Where to find more information American Diabetes Association: diabetes.org Association of Diabetes Care & Education Specialists: diabeteseducator.org Contact a health care provider if: You get a cut or sore, especially on your feet. You have signs of infection after a skin injury. You have itchy skin that turns red or develops a rash. You have discolored areas of skin. You have places on your skin that change. They may thicken or appear shiny. This information is not intended to replace advice given to you by your health care provider. Make sure you discuss any questions you have with your health care provider. Document Revised: 10/09/2021 Document Reviewed: 10/09/2021 Elsevier Patient Education  2024 [employer].

## 2022-09-18 NOTE — Progress Notes (Signed)
Subjective:  Patient ID: Jonathan Clark., male    DOB: 08-Jan-1966  Age: 57 y.o. MRN: [id number]  CC: Hypertension   HPI Jonathan Subia. is a 57 y.o. 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  Interval History:  Last endocrine visit was in 03/2022 and he is doing well on his diabetic regimen.  He continues to experience diabetic neuropathy in his feet but was taken off gabapentin due to increased falls.  His last set of labs revealed normal total cholesterol however he had high triglycerides.  He takes his statin every other day due to myalgias.  Historically he has had high triglycerides.  He has been on testosterone replacement therapy however his insurance quit paying for testosterone.  For his schizoaffective disorder he continues to follow-up with psychiatry.  He quit vaping last week after doing so for 3 months. He quit smoking but states he does not recall exactly how much but thinks it is >20 pack-year. He is thinking of applying for disability.  He has a spot on his arm he would like to see a Dermatologist. He noticed a scrotal lesion and initially thought he cut himself but states it has been present x a few months. It does not itch and has not increased in size.   Past Medical History:  Diagnosis Date   ADD (attention deficit disorder)    Anxiety    Arthritis    right hip   Bipolar 1 disorder (HCC)    Blood in urine    CKD (chronic kidney disease), stage III (HCC)    Creatinine elevation    Dementia (HCC)    "early onset" (08/04/2017)   Depression    bipolar [facility]   Diabetes mellitus without complication (HCC)    Dizziness    Family history of anesthesia complication    pt is unsure , but pt father may have been difficult to  arouse    Frequent falls    HCAP (healthcare-associated pneumonia) 10/31/2012   History of kidney stones    Hypertension    Hypogonadism male    Liver fatty degeneration    Lumbar radiculopathy    Microscopic hematuria    hereditary s/p Urology eval   Neuromuscular disorder (HCC)    feet neuropathy    Osteoarthritis of right hip 11/28/2011   2012 2015 s/p THR Severe  Dr Margreta Journey     Pleural effusion 11/02/2012   Pneumonia 10/2012   Pneumonia, organism unspecified(486) 11/02/2012   Polysubstance dependence, non-opioid, in remission (HCC)    remote   Primary osteoarthritis of left hip 05/22/2015   PTSD (post-traumatic stress disorder)    SOCIAL ANXIETY DISORDER    Schizoaffective disorder (HCC)    Substance abuse (HCC)    Suicide attempt by multiple drug overdose (HCC) Jan 13, 2016   Grieving his cat's death Oct 09, 2015    Past Surgical History:  Procedure Laterality Date   BACK SURGERY     CLOSED REDUCTION METACARPAL WITH PERCUTANEOUS PINNING Right    LUMBAR DISC SURGERY     TONSILLECTOMY     TOTAL HIP ARTHROPLASTY Right 08/16/2013   Procedure: TOTAL HIP ARTHROPLASTY ANTERIOR APPROACH;  Surgeon: Velna Ochs, MD;  Location: [facility] OR;  Service: Orthopedics;  Laterality: Right;   TOTAL HIP ARTHROPLASTY Left 05/22/2015   Procedure:  TOTAL HIP ARTHROPLASTY ANTERIOR APPROACH;  Surgeon: Marcene Corning, MD;  Location: [facility] OR;  Service: Orthopedics;  Laterality: Left;    Family History  Problem Relation Age of Onset   Diabetes Father    Cancer Mother        died of melanoma with mets   Cervical cancer Sister    Diabetes Sister    Other Neg Hx        hypogonadism   Colon cancer Neg Hx    Colon polyps Neg Hx    Esophageal cancer Neg Hx    Rectal cancer Neg Hx    Stomach cancer Neg Hx     Social History   Socioeconomic History   Marital status: Single    Spouse name: Not on file   Number of children: 0   Years of education: 14   Highest education level: Some college, no degree   Occupational History   Occupation: disability  Tobacco Use   Smoking status: Former    Years: .25    Types: Cigarettes    Quit date: 03/22/2019    Years since quitting: 3.4   Smokeless tobacco: Never  Vaping Use   Vaping Use: Never used  Substance and Sexual Activity   Alcohol use: Not Currently   Drug use: No    Comment: hx of marijuana/cocaine/crack use but sober since 20's   Sexual activity: Not Currently  Other Topics Concern   Not on file  Social History Narrative   05/08/2021 lives alone, sister Selena Batten helps with meds, he has some in home care, lived with sister until Nov 2020   Caffeine- sodas, amount  varies   regular exercise-no   Social Determinants of Health   Financial Resource Strain: Low Risk  (09/16/2022)   Overall Financial Resource Strain (CARDIA)    Difficulty of Paying Living Expenses: Not very hard  Food Insecurity: No Food Insecurity (09/16/2022)   Hunger Vital Sign    Worried About Running Out of Food in the Last Year: Never true    Ran Out of Food in the Last Year: Never true  Transportation Needs: No Transportation Needs (09/16/2022)   PRAPARE - Administrator, Civil Service (Medical): No    Lack of Transportation (Non-Medical): No  Physical Activity: Inactive (09/16/2022)   Exercise Vital Sign    Days of Exercise per Week: 0 days    Minutes of Exercise per Session: 20 min  Stress: Stress Concern Present (09/16/2022)   [employer] of Occupational Health - Occupational Stress Questionnaire    Feeling of Stress : Very much  Social Connections: Socially Isolated (09/16/2022)   Social Connection and Isolation Panel [NHANES]    Frequency of Communication with Friends and Family: Once a week    Frequency of Social Gatherings with Friends and Family: Never    Attends Religious Services: Never    Database administrator or Organizations: No    Attends Engineer, structural: Never    Marital Status: Never married    No Active  Allergies  Outpatient Medications Prior to Visit  Medication Sig Dispense Refill   acetaminophen (TYLENOL) 500 MG tablet Take 1 tablet (500 mg total) by mouth every 8 (eight) hours as needed. 90 tablet 1   ARIPiprazole (ABILIFY) 10 MG tablet Take 1 tablet (10 mg total) by mouth daily. 30 tablet 0   atorvastatin (LIPITOR) 10 MG tablet Take 1 tablet (10 mg total) by mouth daily. 90 tablet 3   buPROPion [facility]  XL) 150 MG 24 hr tablet Take 150 mg by mouth every morning.     Cholecalciferol (VITAMIN D-3) 125 MCG (5000 UT) TABS Take 125 mcg by mouth daily.     Continuous Blood Gluc Receiver (FREESTYLE LIBRE READER) DEVI Use as directed 1 each each   Continuous Blood Gluc Sensor (FREESTYLE LIBRE 2 SENSOR) MISC by Does not apply route.     Dulaglutide (TRULICITY) 1.5 MG/0.5ML SOPN Inject 1.5 mg into the skin once a week. 6 mL 3   fluticasone (FLONASE) 50 MCG/ACT nasal spray Place 1 spray into both nostrils daily. 16 g 6   glucose blood (ACCU-CHEK GUIDE) test strip CHECK SUGAR FOUR TIMES DAILY 100 each 12   insulin glargine (LANTUS SOLOSTAR) 100 UNIT/ML Solostar Pen Lantus 37 units before b'fast (may increase by 4 units every 4 days if sugars in am are not <130) 45 mL 1   insulin lispro (HUMALOG KWIKPEN) 200 UNIT/ML KwikPen Inject 15-20 Units into the skin 3 (three) times daily before meals. 60 mL 1   losartan (COZAAR) 25 MG tablet Take 25 mg by mouth daily.     magnesium oxide (MAG-OX) 400 (240 Mg) MG tablet Take 1 tablet (400 mg total) by mouth daily. 30 tablet 0   Multiple Vitamins-Minerals (MULTIVITAMIN WITH MINERALS) tablet Take 1 tablet by mouth daily.     polyvinyl alcohol (LIQUIFILM TEARS) 1.4 % ophthalmic solution Place 1 drop into both eyes as needed for dry eyes. 15 mL 1   Testosterone 20.25 MG/1.25GM (1.62%) GEL Apply topically once a day to upper arms 112.5 g 1   traZODone (DESYREL) 150 MG tablet Take 150 mg by mouth at bedtime.     alfuzosin (UROXATRAL) 10 MG 24 hr tablet Take 1  tablet (10 mg total) by mouth daily with breakfast. 90 tablet 1   Insulin Pen Needle (B-D UF III MINI PEN NEEDLES) 31G X 5 MM MISC USE 4 TIMES DAILY 120 each 12   pantoprazole (PROTONIX) 40 MG tablet Take 1 tablet (40 mg total) by mouth daily. 90 tablet 1   No facility-administered medications prior to visit.     ROS Review of Systems  Constitutional:  Negative for activity change and appetite change.  HENT:  Negative for sinus pressure and sore throat.   Respiratory:  Negative for chest tightness, shortness of breath and wheezing.   Cardiovascular:  Negative for chest pain and palpitations.  Gastrointestinal:  Negative for abdominal distention, abdominal pain and constipation.  Genitourinary: Negative.   Musculoskeletal:  Positive for back pain.  Skin:  Positive for rash.  Neurological:  Positive for numbness.  Psychiatric/Behavioral:  Negative for behavioral problems and dysphoric mood.     Objective:  BP (!) 144/84   Pulse 99   Ht 6\' 4"  (1.93 m)   Wt 213 lb (96.6 kg)   SpO2 97%   BMI 25.93 kg/m      09/18/2022   11:45 AM 09/18/2022   10:59 AM 07/16/2022   11:17 AM  BP/Weight  Systolic BP 144 148 132  Diastolic BP 84 84 84  Wt. (Lbs)  213 223  BMI  25.93 kg/m2 27.14 kg/m2      Physical Exam Genitourinary:    Comments: Hyperpigmented wartlike lesion on ventral aspect of scrotum which is not tender Skin:    Comments: Hyperpigmented dorsum of right forearm.        Latest Ref Rng & Units 02/10/2022    3:52 PM 01/03/2022    5:57 AM 12/31/2021  6:07 AM  CMP  Glucose 70 - 99 mg/dL 161  096  045   BUN 6 - 24 mg/dL 16  17  18    Creatinine 0.76 - 1.27 mg/dL 4.09  8.11  9.14   Sodium 134 - 144 mmol/L 143  140  141   Potassium 3.5 - 5.2 mmol/L 5.2  3.6  4.1   Chloride 96 - 106 mmol/L 105  110  109   CO2 20 - 29 mmol/L 24  23  25    Calcium 8.7 - 10.2 mg/dL 78.2  9.2  9.4   Total Protein 6.0 - 8.5 g/dL 7.3     Total Bilirubin 0.0 - 1.2 mg/dL 0.3     Alkaline Phos  44 - 121 IU/L 73     AST 0 - 40 IU/L 20     ALT 0 - 44 IU/L 23       Lipid Panel     Component Value Date/Time   CHOL 139 07/16/2022 1144   CHOL 167 07/06/2020 1116   TRIG 285.0 (H) 07/16/2022 1144   HDL 43.20 07/16/2022 1144   HDL 39 (L) 07/06/2020 1116   CHOLHDL 3 07/16/2022 1144   VLDL 57.0 (H) 07/16/2022 1144   LDLCALC 76 07/06/2020 1116   LDLDIRECT 62.0 07/16/2022 1144    CBC    Component Value Date/Time   WBC 7.9 02/10/2022 1552   WBC 3.7 (L) 01/02/2022 0426   RBC 5.20 02/10/2022 1552   RBC 4.36 01/02/2022 0426   HGB 15.3 02/10/2022 1552   HCT 45.6 02/10/2022 1552   PLT 177 02/10/2022 1552   MCV 88 02/10/2022 1552   MCH 29.4 02/10/2022 1552   MCH 28.7 01/02/2022 0426   MCHC 33.6 02/10/2022 1552   MCHC 33.4 01/02/2022 0426   RDW 13.3 02/10/2022 1552   LYMPHSABS 1.3 12/15/2021 0659   LYMPHSABS 1.7 11/19/2021 1640   MONOABS 0.5 12/15/2021 0659   EOSABS 0.1 12/15/2021 0659   EOSABS 0.0 11/19/2021 1640   BASOSABS 0.0 12/15/2021 0659   BASOSABS 0.0 11/19/2021 1640    Lab Results  Component Value Date   HGBA1C 7.3 (A) 07/16/2022    Assessment & Plan:  1. Schizoaffective disorder, bipolar type (HCC) Stable Management per psych  2. Hypertriglyceridemia Uncontrolled Will add on Tricor and he has been counseled about possible myalgias when combining fibrate with statin and if he has any of the symptoms he will need to discontinue fenofibrate and take omega-3 fish oil capsules. - fenofibrate (TRICOR) 48 MG tablet; Take 1 tablet (48 mg total) by mouth daily.  Dispense: 90 tablet; Refill: 1  3. Benign prostatic hyperplasia with urinary retention Stable - alfuzosin (UROXATRAL) 10 MG 24 hr tablet; Take 1 tablet (10 mg total) by mouth daily with breakfast.  Dispense: 90 tablet; Refill: 1  4. Type 2 diabetes mellitus with stage 3a chronic kidney disease, with long-term current use of insulin (HCC) Diabetes is controlled with A1c of 7.3 Management per endocrine He  has diabetic nephropathy Continue to follow-up with [facility] Counseled on Diabetic diet, my plate method, 956 minutes of moderate intensity exercise/week Blood sugar logs with fasting goals of 80-120 mg/dl, random of less than 213 and in the event of sugars less than 60 mg/dl or greater than 086 mg/dl encouraged to notify the clinic. Advised on the need for annual eye exams, annual foot exams, Pneumonia vaccine. - Insulin Pen Needle (B-D UF III MINI PEN NEEDLES) 31G X 5 MM MISC; USE 4  TIMES DAILY  Dispense: 120 each; Refill: 12  5. Gastroesophageal reflux disease without esophagitis Controlled - pantoprazole (PROTONIX) 40 MG tablet; Take 1 tablet (40 mg total) by mouth daily.  Dispense: 90 tablet; Refill: 1  6. Ataxia He ambulates with a walker Fall precautions  7. Other diabetic neurological complication associated with type 2 diabetes mellitus (HCC) He has lost sensation in his feet Was taken off gabapentin due to increased falls and metabolic encephalopathy during hospitalization  8. Smoking greater than 20 pack years He has quit smoking but does meet criteria for screening - CT CHEST LUNG CANCER SCREENING LOW DOSE WO CONTRAST; Future  9. Rash and nonspecific skin eruption - Ambulatory referral to Dermatology  10. Scrotal lesion Rash seems like a wart but will refer to urology for further evaluation. - Ambulatory referral to Urology    Meds ordered this encounter  Medications   fenofibrate (TRICOR) 48 MG tablet    Sig: Take 1 tablet (48 mg total) by mouth daily.    Dispense:  90 tablet    Refill:  1   alfuzosin (UROXATRAL) 10 MG 24 hr tablet    Sig: Take 1 tablet (10 mg total) by mouth daily with breakfast.    Dispense:  90 tablet    Refill:  1   Insulin Pen Needle (B-D UF III MINI PEN NEEDLES) 31G X 5 MM MISC    Sig: USE 4 TIMES DAILY    Dispense:  120 each    Refill:  12   pantoprazole (PROTONIX) 40 MG tablet    Sig: Take 1 tablet (40 mg total) by  mouth daily.    Dispense:  90 tablet    Refill:  1    Follow-up: Return in about 6 months (around 03/21/2023) for Chronic medical conditions.       Hoy Register, MD, FAAFP. [facility] [place], [place] [phone]   09/18/2022, 1:19 PM

## 2022-09-23 DIAGNOSIS — F4010 Social phobia, unspecified: Secondary | ICD-10-CM | POA: Diagnosis not present

## 2022-09-24 DIAGNOSIS — M47816 Spondylosis without myelopathy or radiculopathy, lumbar region: Secondary | ICD-10-CM | POA: Diagnosis not present

## 2022-09-24 DIAGNOSIS — M5416 Radiculopathy, lumbar region: Secondary | ICD-10-CM | POA: Diagnosis not present

## 2022-09-25 DIAGNOSIS — R351 Nocturia: Secondary | ICD-10-CM | POA: Diagnosis not present

## 2022-09-25 DIAGNOSIS — R3915 Urgency of urination: Secondary | ICD-10-CM | POA: Diagnosis not present

## 2022-09-25 DIAGNOSIS — N401 Enlarged prostate with lower urinary tract symptoms: Secondary | ICD-10-CM | POA: Diagnosis not present

## 2022-10-07 DIAGNOSIS — H524 Presbyopia: Secondary | ICD-10-CM | POA: Diagnosis not present

## 2022-10-07 DIAGNOSIS — E119 Type 2 diabetes mellitus without complications: Secondary | ICD-10-CM | POA: Diagnosis not present

## 2022-10-07 DIAGNOSIS — Z0100 Encounter for examination of eyes and vision without abnormal findings: Secondary | ICD-10-CM | POA: Diagnosis not present

## 2022-10-07 LAB — HM DIABETES EYE EXAM

## 2022-10-13 DIAGNOSIS — N1832 Chronic kidney disease, stage 3b: Secondary | ICD-10-CM | POA: Diagnosis not present

## 2022-10-13 DIAGNOSIS — D631 Anemia in chronic kidney disease: Secondary | ICD-10-CM | POA: Diagnosis not present

## 2022-10-13 DIAGNOSIS — N189 Chronic kidney disease, unspecified: Secondary | ICD-10-CM | POA: Diagnosis not present

## 2022-10-13 DIAGNOSIS — I129 Hypertensive chronic kidney disease with stage 1 through stage 4 chronic kidney disease, or unspecified chronic kidney disease: Secondary | ICD-10-CM | POA: Diagnosis not present

## 2022-10-13 DIAGNOSIS — E1122 Type 2 diabetes mellitus with diabetic chronic kidney disease: Secondary | ICD-10-CM | POA: Diagnosis not present

## 2022-10-13 DIAGNOSIS — F319 Bipolar disorder, unspecified: Secondary | ICD-10-CM | POA: Diagnosis not present

## 2022-10-14 DIAGNOSIS — F4010 Social phobia, unspecified: Secondary | ICD-10-CM | POA: Diagnosis not present

## 2022-10-15 ENCOUNTER — Other Ambulatory Visit: Payer: Medicare HMO

## 2022-10-28 DIAGNOSIS — F4010 Social phobia, unspecified: Secondary | ICD-10-CM | POA: Diagnosis not present

## 2022-10-29 ENCOUNTER — Ambulatory Visit: Payer: Medicare HMO | Attending: Family Medicine

## 2022-10-29 VITALS — Ht 76.0 in | Wt 213.0 lb

## 2022-10-29 DIAGNOSIS — Z0000 Encounter for general adult medical examination without abnormal findings: Secondary | ICD-10-CM

## 2022-10-29 NOTE — Patient Instructions (Addendum)
[name] , Thank you for taking time to come for your Medicare Wellness Visit. I appreciate your ongoing commitment to your health goals. Please review the following plan we discussed and let me know if I can assist you in the future.   These are the goals we discussed:  Goals      Prevent falls        This is a list of the screening recommended for you and due dates:  Health Maintenance  Topic Date Due   Screening for Lung Cancer  12/14/2017   Yearly kidney health urinalysis for diabetes  07/06/2021   COVID-19 Vaccine (4 - 2023-24 season) 12/20/2021   Eye exam for diabetics  09/04/2022   Zoster (Shingles) Vaccine (1 of 2) 01/29/2023*   Flu Shot  11/20/2022   Hemoglobin A1C  01/16/2023   Yearly kidney function blood test for diabetes  02/11/2023   Colon Cancer Screening  03/22/2023   Complete foot exam   05/15/2023   Medicare Annual Wellness Visit  10/29/2023   DTaP/Tdap/Td vaccine (2 - Td or Tdap) 11/29/2024   Hepatitis C Screening  Completed   HIV Screening  Completed   HPV Vaccine  Aged Out  *Topic was postponed. The date shown is not the original due date.    Advanced directives: We have a copy of your advanced directives available in your record should your provider ever need to access them.  Conditions/risks identified: Aim for 30 minutes of exercise or brisk walking, 6-8 glasses of water, and 5 servings of fruits and vegetables each day.  Next appointment: Follow up in one year for your annual wellness visit   Preventive Care 40-64 Years, Male Preventive care refers to lifestyle choices and visits with your health care provider that can promote health and wellness. What does preventive care include? A yearly physical exam. This is also called an annual well check. Dental exams once or twice a year. Routine eye exams. Ask your health care provider how often you should have your eyes checked. Personal lifestyle choices, including: Daily care of your teeth and  gums. Regular physical activity. Eating a healthy diet. Avoiding tobacco and drug use. Limiting alcohol use. Practicing safe sex. Taking low-dose aspirin every day starting at age 67. What happens during an annual well check? The services and screenings done by your health care provider during your annual well check will depend on your age, overall health, lifestyle risk factors, and family history of disease. Counseling  Your health care provider may ask you questions about your: Alcohol use. Tobacco use. Drug use. Emotional well-being. Home and relationship well-being. Sexual activity. Eating habits. Work and work Astronomer. Screening  You may have the following tests or measurements: Height, weight, and BMI. Blood pressure. Lipid and cholesterol levels. These may be checked every 5 years, or more frequently if you are over 33 years old. Skin check. Lung cancer screening. You may have this screening every year starting at age 13 if you have a 30-pack-year history of smoking and currently smoke or have quit within the past 15 years. Fecal occult blood test (FOBT) of the stool. You may have this test every year starting at age 73. Flexible sigmoidoscopy or colonoscopy. You may have a sigmoidoscopy every 5 years or a colonoscopy every 10 years starting at age 82. Prostate cancer screening. Recommendations will vary depending on your family history and other risks. Hepatitis C blood test. Hepatitis B blood test. Sexually transmitted disease (STD) testing. Diabetes screening. This  is done by checking your blood sugar (glucose) after you have not eaten for a while (fasting). You may have this done every 1-3 years. Discuss your test results, treatment options, and if necessary, the need for more tests with your health care provider. Vaccines  Your health care provider may recommend certain vaccines, such as: Influenza vaccine. This is recommended every year. Tetanus, diphtheria, and  acellular pertussis (Tdap, Td) vaccine. You may need a Td booster every 10 years. Zoster vaccine. You may need this after age 70. Pneumococcal 13-valent conjugate (PCV13) vaccine. You may need this if you have certain conditions and have not been vaccinated. Pneumococcal polysaccharide (PPSV23) vaccine. You may need one or two doses if you smoke cigarettes or if you have certain conditions. Talk to your health care provider about which screenings and vaccines you need and how often you need them. This information is not intended to replace advice given to you by your health care provider. Make sure you discuss any questions you have with your health care provider. Document Released: 05/04/2015 Document Revised: 12/26/2015 Document Reviewed: 02/06/2015 Elsevier Interactive Patient Education  2017 [employer].  Fall Prevention in the Home Falls can cause injuries. They can happen to people of all ages. There are many things you can do to make your home safe and to help prevent falls. What can I do on the outside of my home? Regularly fix the edges of walkways and driveways and fix any cracks. Remove anything that might make you trip as you walk through a door, such as a raised step or threshold. Trim any bushes or trees on the path to your home. Use bright outdoor lighting. Clear any walking paths of anything that might make someone trip, such as rocks or tools. Regularly check to see if handrails are loose or broken. Make sure that both sides of any steps have handrails. Any raised decks and porches should have guardrails on the edges. Have any leaves, snow, or ice cleared regularly. Use sand or salt on walking paths during winter. Clean up any spills in your garage right away. This includes oil or grease spills. What can I do in the bathroom? Use night lights. Install grab bars by the toilet and in the tub and shower. Do not use towel bars as grab bars. Use non-skid mats or decals in the  tub or shower. If you need to sit down in the shower, use a plastic, non-slip stool. Keep the floor dry. Clean up any water that spills on the floor as soon as it happens. Remove soap buildup in the tub or shower regularly. Attach bath mats securely with double-sided non-slip rug tape. Do not have throw rugs and other things on the floor that can make you trip. What can I do in the bedroom? Use night lights. Make sure that you have a light by your bed that is easy to reach. Do not use any sheets or blankets that are too big for your bed. They should not hang down onto the floor. Have a firm chair that has side arms. You can use this for support while you get dressed. Do not have throw rugs and other things on the floor that can make you trip. What can I do in the kitchen? Clean up any spills right away. Avoid walking on wet floors. Keep items that you use a lot in easy-to-reach places. If you need to reach something above you, use a strong step stool that has a grab bar.  Keep electrical cords out of the way. Do not use floor polish or wax that makes floors slippery. If you must use wax, use non-skid floor wax. Do not have throw rugs and other things on the floor that can make you trip. What can I do with my stairs? Do not leave any items on the stairs. Make sure that there are handrails on both sides of the stairs and use them. Fix handrails that are broken or loose. Make sure that handrails are as long as the stairways. Check any carpeting to make sure that it is firmly attached to the stairs. Fix any carpet that is loose or worn. Avoid having throw rugs at the top or bottom of the stairs. If you do have throw rugs, attach them to the floor with carpet tape. Make sure that you have a light switch at the top of the stairs and the bottom of the stairs. If you do not have them, ask someone to add them for you. What else can I do to help prevent falls? Wear shoes that: Do not have high  heels. Have rubber bottoms. Are comfortable and fit you well. Are closed at the toe. Do not wear sandals. If you use a stepladder: Make sure that it is fully opened. Do not climb a closed stepladder. Make sure that both sides of the stepladder are locked into place. Ask someone to hold it for you, if possible. Clearly mark and make sure that you can see: Any grab bars or handrails. First and last steps. Where the edge of each step is. Use tools that help you move around (mobility aids) if they are needed. These include: Canes. Walkers. Scooters. Crutches. Turn on the lights when you go into a dark area. Replace any light bulbs as soon as they burn out. Set up your furniture so you have a clear path. Avoid moving your furniture around. If any of your floors are uneven, fix them. If there are any pets around you, be aware of where they are. Review your medicines with your doctor. Some medicines can make you feel dizzy. This can increase your chance of falling. Ask your doctor what other things that you can do to help prevent falls. This information is not intended to replace advice given to you by your health care provider. Make sure you discuss any questions you have with your health care provider. Document Released: 02/01/2009 Document Revised: 09/13/2015 Document Reviewed: 05/12/2014 Elsevier Interactive Patient Education  2017 [employer].

## 2022-10-29 NOTE — Progress Notes (Signed)
Subjective:   [name]. is a 57 y.o. male who presents for Medicare Annual/Subsequent preventive examination.  Visit Complete: Virtual  I connected with  [name]. on 10/29/22 by a audio enabled telemedicine application and verified that I am speaking with the correct person using two identifiers.  Patient Location: Home  Provider Location: Home Office  I discussed the limitations of evaluation and management by telemedicine. The patient expressed understanding and agreed to proceed.  Review of Systems     Cardiac Risk Factors include: diabetes mellitus;hypertension;male gender;sedentary lifestyle     Objective:    Today's Vitals   10/29/22 1317  Weight: 213 lb (96.6 kg)  Height: 6\' 4"  (1.93 m)   Body mass index is 25.93 kg/m.     10/29/2022    1:23 PM 01/31/2022   12:08 PM 12/27/2021    7:40 AM 12/02/2021    4:00 PM 12/01/2021    5:37 PM 06/16/2021   11:37 AM 06/13/2021    4:15 PM  Advanced Directives  Does Patient Have a Medical Advance Directive? No Yes Unable to assess, patient is non-responsive or altered mental status Unable to assess, patient is non-responsive or altered mental status Unable to assess, patient is non-responsive or altered mental status No Yes  Type of Customer service manager Power of Enhaut;Living will       Would patient like information on creating a medical advance directive? Yes (MAU/Ambulatory/Procedural Areas - Information given)     No - Patient declined     Current Medications (verified) Outpatient Encounter Medications as of 10/29/2022  Medication Sig   acetaminophen (TYLENOL) 500 MG tablet Take 1 tablet (500 mg total) by mouth every 8 (eight) hours as needed.   alfuzosin (UROXATRAL) 10 MG 24 hr tablet Take 1 tablet (10 mg total) by mouth daily with breakfast.   ARIPiprazole (ABILIFY) 10 MG tablet Take 1 tablet (10 mg total) by mouth daily.   atorvastatin (LIPITOR) 10 MG tablet Take 1 tablet (10 mg total)  by mouth daily.   buPROPion (WELLBUTRIN XL) 150 MG 24 hr tablet Take 150 mg by mouth every morning.   Cholecalciferol (VITAMIN D-3) 125 MCG (5000 UT) TABS Take 125 mcg by mouth daily.   Continuous Blood Gluc Receiver (FREESTYLE LIBRE READER) DEVI Use as directed   Continuous Blood Gluc Sensor (FREESTYLE LIBRE 2 SENSOR) MISC by Does not apply route.   Dulaglutide (TRULICITY) 1.5 MG/0.5ML SOPN Inject 1.5 mg into the skin once a week.   fenofibrate (TRICOR) 48 MG tablet Take 1 tablet (48 mg total) by mouth daily.   fluticasone (FLONASE) 50 MCG/ACT nasal spray Place 1 spray into both nostrils daily.   glucose blood (ACCU-CHEK GUIDE) test strip CHECK SUGAR FOUR TIMES DAILY   insulin glargine (LANTUS SOLOSTAR) 100 UNIT/ML Solostar Pen Lantus 37 units before b'fast (may increase by 4 units every 4 days if sugars in am are not <130)   insulin lispro (HUMALOG KWIKPEN) 200 UNIT/ML KwikPen Inject 15-20 Units into the skin 3 (three) times daily before meals.   Insulin Pen Needle (B-D UF III MINI PEN NEEDLES) 31G X 5 MM MISC USE 4 TIMES DAILY   losartan (COZAAR) 25 MG tablet Take 25 mg by mouth daily.   magnesium oxide (MAG-OX) 400 (240 Mg) MG tablet Take 1 tablet (400 mg total) by mouth daily.   Multiple Vitamins-Minerals (MULTIVITAMIN WITH MINERALS) tablet Take 1 tablet by mouth daily.   pantoprazole (PROTONIX) 40 MG tablet Take 1  tablet (40 mg total) by mouth daily.   polyvinyl alcohol (LIQUIFILM TEARS) 1.4 % ophthalmic solution Place 1 drop into both eyes as needed for dry eyes.   Testosterone 20.25 MG/1.25GM (1.62%) GEL Apply topically once a day to upper arms   traZODone (DESYREL) 100 MG tablet Take 200 mg by mouth at bedtime as needed.   [DISCONTINUED] traZODone (DESYREL) 150 MG tablet Take 150 mg by mouth at bedtime.   No facility-administered encounter medications on file as of 10/29/2022.    Allergies (verified) Patient has no known allergies.   History: Past Medical History:  Diagnosis Date    ADD (attention deficit disorder)    Anxiety    Arthritis    right hip   Bipolar 1 disorder (HCC)    Blood in urine    CKD (chronic kidney disease), stage III (HCC)    Creatinine elevation    Dementia (HCC)    "early onset" (08/04/2017)   Depression    bipolar [facility]   Diabetes mellitus without complication (HCC)    Dizziness    Family history of anesthesia complication    pt is unsure , but pt father may have been difficult to arouse    Frequent falls    HCAP (healthcare-associated pneumonia) 10/31/2012   History of kidney stones    Hypertension    Hypogonadism male    Liver fatty degeneration    Lumbar radiculopathy    Microscopic hematuria    hereditary s/p Urology eval   Neuromuscular disorder (HCC)    feet neuropathy    Osteoarthritis of right hip 11/28/2011   2012 2015 s/p THR Severe  Dr Margreta Journey     Pleural effusion 11/02/2012   Pneumonia November 16, 2012   Pneumonia, organism unspecified(486) 11/02/2012   Polysubstance dependence, non-opioid, in remission (HCC)    remote   Primary osteoarthritis of left hip 05/22/2015   PTSD (post-traumatic stress disorder)    SOCIAL ANXIETY DISORDER    Schizoaffective disorder (HCC)    Substance abuse (HCC)    Suicide attempt by multiple drug overdose (HCC) 01/10/2016   Grieving his cat's death 2015/11/17   Past Surgical History:  Procedure Laterality Date   BACK SURGERY     CLOSED REDUCTION METACARPAL WITH PERCUTANEOUS PINNING Right    LUMBAR DISC SURGERY     TONSILLECTOMY     TOTAL HIP ARTHROPLASTY Right 08/16/2013   Procedure: TOTAL HIP ARTHROPLASTY ANTERIOR APPROACH;  Surgeon: Velna Ochs, MD;  Location: [facility] OR;  Service: Orthopedics;  Laterality: Right;   TOTAL HIP ARTHROPLASTY Left 05/22/2015   Procedure: TOTAL HIP ARTHROPLASTY ANTERIOR APPROACH;  Surgeon: Marcene Corning, MD;  Location: [facility] OR;  Service: Orthopedics;  Laterality: Left;   Family History  Problem Relation Age of Onset   Diabetes Father    Cancer Mother         died of melanoma with mets   Cervical cancer Sister    Diabetes Sister    Other Neg Hx        hypogonadism   Colon cancer Neg Hx    Colon polyps Neg Hx    Esophageal cancer Neg Hx    Rectal cancer Neg Hx    Stomach cancer Neg Hx    Social History   Socioeconomic History   Marital status: Single    Spouse name: Not on file   Number of children: 0   Years of education: 14   Highest education level: Some college, no degree  Occupational History   Occupation:  disability  Tobacco Use   Smoking status: Former    Years: .25    Types: Cigarettes    Quit date: 03/22/2019    Years since quitting: 3.6   Smokeless tobacco: Never  Vaping Use   Vaping Use: Never used  Substance and Sexual Activity   Alcohol use: Not Currently   Drug use: No    Comment: hx of marijuana/cocaine/crack use but sober since 20's   Sexual activity: Not Currently  Other Topics Concern   Not on file  Social History Narrative   05/08/2021 lives alone, sister Selena Batten helps with meds, he has some in home care, lived with sister until Nov 2020   Caffeine- sodas, amount  varies   regular exercise-no   Social Determinants of Health   Financial Resource Strain: Low Risk  (10/29/2022)   Overall Financial Resource Strain (CARDIA)    Difficulty of Paying Living Expenses: Not very hard  Food Insecurity: No Food Insecurity (10/29/2022)   Hunger Vital Sign    Worried About Running Out of Food in the Last Year: Never true    Ran Out of Food in the Last Year: Never true  Transportation Needs: No Transportation Needs (10/29/2022)   PRAPARE - Administrator, Civil Service (Medical): No    Lack of Transportation (Non-Medical): No  Physical Activity: Inactive (10/29/2022)   Exercise Vital Sign    Days of Exercise per Week: 0 days    Minutes of Exercise per Session: 0 min  Stress: Stress Concern Present (10/29/2022)   [employer] of Occupational Health - Occupational Stress Questionnaire    Feeling of  Stress : To some extent  Social Connections: Socially Isolated (10/29/2022)   Social Connection and Isolation Panel [NHANES]    Frequency of Communication with Friends and Family: Once a week    Frequency of Social Gatherings with Friends and Family: Once a week    Attends Religious Services: Never    Database administrator or Organizations: Yes    Attends Banker Meetings: Never    Marital Status: Never married    Tobacco Counseling Counseling given: Not Answered   Clinical Intake:  Pre-visit preparation completed: Yes  Pain : No/denies pain     Diabetes: Yes CBG done?: No Did pt. bring in CBG monitor from home?: No  How often do you need to have someone help you when you read instructions, pamphlets, or other written materials from your doctor or pharmacy?: 1 - Never  Interpreter Needed?: No  Information entered by :: Kandis Fantasia LPN   Activities of Daily Living    10/29/2022    1:18 PM 01/31/2022   12:09 PM  In your present state of health, do you have any difficulty performing the following activities:  Hearing? 0 0  Vision? 0 0  Difficulty concentrating or making decisions? 0 1  Walking or climbing stairs? 1 1  Dressing or bathing? 0 1  Doing errands, shopping? 1 1  Preparing Food and eating ? N N  Using the Toilet? N N  In the past six months, have you accidently leaked urine? N Y  Do you have problems with loss of bowel control? N N  Managing your Medications? N Y  Managing your Finances? N Y  Housekeeping or managing your Housekeeping? N N    Patient Care Team: Hoy Register, MD as PCP - General (Family Medicine) Maeola Harman, MD as Attending Physician (Neurosurgery) Center, [facility] [place], Lise Auer, [place] as  Consulting Physician (Podiatry) Carim, [name] (Urology) Patton Salles, LCSW as Counselor Carlus Pavlov, MD as Consulting Physician (Internal Medicine)  Indicate any recent Medical Services you may have  received from other than [facility] providers in the past year (date may be approximate).     Assessment:   This is a routine wellness examination for [name].  Hearing/Vision screen Hearing Screening - Comments:: Denies hearing difficulties   Vision Screening - Comments:: Wears rx glasses - up to date with routine eye exams with [facility]   Dietary issues and exercise activities discussed:     Goals Addressed             This Visit's Progress    Prevent falls        Depression Screen    10/29/2022    1:21 PM 09/18/2022   11:04 AM 04/18/2022   10:51 AM 01/31/2022   12:08 PM 11/19/2021    3:05 PM 08/28/2021   10:33 AM 06/13/2021    4:15 PM  PHQ 2/9 Scores  PHQ - 2 Score 6 6 6  0 6 6 1   PHQ- 9 Score 20 20 22  24 23      Fall Risk    10/29/2022    1:23 PM 09/18/2022   11:00 AM 04/18/2022   10:51 AM 02/10/2022    3:07 PM 01/31/2022   12:09 PM  Fall Risk   Falls in the past year? 1 1 0 1 0  Number falls in past yr: 0 0 0 0 0  Injury with Fall? 0 0 0 1 0  Risk for fall due to : History of fall(s);Impaired mobility History of fall(s)  Other (Comment)   Follow up Falls prevention discussed;Education provided;Falls evaluation completed        MEDICARE RISK AT HOME:  Medicare Risk at Home - 10/29/22 1324     Any stairs in or around the home? No    If so, are there any without handrails? No    Home free of loose throw rugs in walkways, pet beds, electrical cords, etc? Yes    Adequate lighting in your home to reduce risk of falls? Yes    Life alert? No    Use of a cane, walker or w/c? Yes    Grab bars in the bathroom? Yes    Shower chair or bench in shower? No    Elevated toilet seat or a handicapped toilet? No             TIMED UP AND GO:  Was the test performed?  No    Cognitive Function:    01/31/2022   12:10 PM 05/08/2021    2:37 PM 09/14/2019   11:29 AM  MMSE - Mini Mental State Exam  Orientation to time 5 3 5   Orientation to Place 5 4 4    Registration 3 3 3   Attention/ Calculation 5 0 0  Recall 3 2 2   Language- name 2 objects 2 2 2   Language- repeat 1 1 1   Language- follow 3 step command 3 3 3   Language- read & follow direction 1 1 1   Write a sentence 1 1 1   Copy design 1 1 0  Total score 30 21 22       03/19/2017    1:20 PM  Montreal Cognitive Assessment   Visuospatial/ Executive (0/5) 3  Naming (0/3) 2  Attention: Read list of digits (0/2) 1  Attention: Read list of letters (0/1) 1  Attention: Serial 7  subtraction starting at 100 (0/3) 1  Language: Repeat phrase (0/2) 0  Language : Fluency (0/1) 0  Abstraction (0/2) 1  Delayed Recall (0/5) 1  Orientation (0/6) 6  Total 16  Adjusted Score (based on education) 16      10/29/2022    1:24 PM 01/31/2022   12:11 PM  6CIT Screen  What Year? 0 points 0 points  What month? 0 points 0 points  What time? 0 points 0 points  Count back from 20 0 points 0 points  Months in reverse 2 points 2 points  Repeat phrase 0 points 0 points  Total Score 2 points 2 points    Immunizations Immunization History  Administered Date(s) Administered   Influenza Split 02/17/2011, 04/04/2020   Influenza Whole 03/07/2004, 01/18/2010   Influenza, Seasonal, Injecte, Preservative Fre 06/10/2012   Influenza,inj,Quad PF,6+ Mos 06/13/2013, 01/01/2015, 01/30/2019, 05/27/2021, 02/10/2022   Influenza,inj,quad, With Preservative 01/09/2016   Influenza-Unspecified 01/04/2014, 01/10/2016   Moderna Sars-Covid-2 Vaccination 06/01/2019, 06/29/2019   PFIZER Comirnaty(Gray Top)Covid-19 Tri-Sucrose Vaccine 04/04/2020   Pneumococcal Conjugate-13 04/18/2015   Pneumococcal Polysaccharide-23 10/31/2012, 11/19/2020   Tdap 11/30/2014    TDAP status: Up to date  Pneumococcal vaccine status: Up to date  Covid-19 vaccine status: Information provided on how to obtain vaccines.   Qualifies for Shingles Vaccine? Yes   Zostavax completed No   Shingrix Completed?: No.    Education has been provided  regarding the importance of this vaccine. Patient has been advised to call insurance company to determine out of pocket expense if they have not yet received this vaccine. Advised may also receive vaccine at local pharmacy or Health Dept. Verbalized acceptance and understanding.  Screening Tests Health Maintenance  Topic Date Due   Lung Cancer Screening  12/14/2017   Diabetic kidney evaluation - Urine ACR  07/06/2021   COVID-19 Vaccine (4 - 2023-24 season) 12/20/2021   OPHTHALMOLOGY EXAM  09/04/2022   Zoster Vaccines- Shingrix (1 of 2) 01/29/2023 (Originally 12/15/1984)   INFLUENZA VACCINE  11/20/2022   HEMOGLOBIN A1C  01/16/2023   Diabetic kidney evaluation - eGFR measurement  02/11/2023   Colonoscopy  03/22/2023   FOOT EXAM  05/15/2023   Medicare Annual Wellness (AWV)  10/29/2023   DTaP/Tdap/Td (2 - Td or Tdap) 11/29/2024   Hepatitis C Screening  Completed   HIV Screening  Completed   HPV VACCINES  Aged Out    Health Maintenance  Health Maintenance Due  Topic Date Due   Lung Cancer Screening  12/14/2017   Diabetic kidney evaluation - Urine ACR  07/06/2021   COVID-19 Vaccine (4 - 2023-24 season) 12/20/2021   OPHTHALMOLOGY EXAM  09/04/2022    Colorectal cancer screening: Type of screening: Colonoscopy. Completed 03/21/20. Repeat every 3 years  Lung Cancer Screening: (Low Dose CT Chest recommended if Age 87-80 years, 20 pack-year currently smoking OR have quit w/in 15years.) does not qualify.   Lung Cancer Screening Referral: n/a  Additional Screening:  Hepatitis C Screening: does qualify; Completed 02/12/15  Vision Screening: Recommended annual ophthalmology exams for early detection of glaucoma and other disorders of the eye. Is the patient up to date with their annual eye exam?  Yes  Who is the provider or what is the name of the office in which the patient attends annual eye exams? [facility] If pt is not established with a provider, would they like to be  referred to a provider to establish care? No .   Dental Screening: Recommended annual dental exams for  proper oral hygiene  Diabetic Foot Exam: Diabetic Foot Exam: Completed 05/14/22  Community Resource Referral / Chronic Care Management: CRR required this visit?  No   CCM required this visit?  No     Plan:     I have personally reviewed and noted the following in the patient's chart:   Medical and social history Use of alcohol, tobacco or illicit drugs  Current medications and supplements including opioid prescriptions. Patient is not currently taking opioid prescriptions. Functional ability and status Nutritional status Physical activity Advanced directives List of other physicians Hospitalizations, surgeries, and ER visits in previous 12 months Vitals Screenings to include cognitive, depression, and falls Referrals and appointments  In addition, I have reviewed and discussed with patient certain preventive protocols, quality metrics, and best practice recommendations. A written personalized care plan for preventive services as well as general preventive health recommendations were provided to patient.     Kandis Fantasia [place], [place]   1/61/0960   After Visit Summary: (MyChart) Due to this being a telephonic visit, the after visit summary with patients personalized plan was offered to patient via MyChart   Nurse Notes: No concerns;  patient scheduled for office visit for boil

## 2022-10-31 ENCOUNTER — Encounter: Payer: Self-pay | Admitting: Nurse Practitioner

## 2022-10-31 ENCOUNTER — Ambulatory Visit: Payer: Medicare HMO | Attending: Nurse Practitioner | Admitting: Nurse Practitioner

## 2022-10-31 VITALS — BP 127/77 | HR 79 | Ht 76.0 in | Wt 222.8 lb

## 2022-10-31 DIAGNOSIS — L02212 Cutaneous abscess of back [any part, except buttock]: Secondary | ICD-10-CM | POA: Diagnosis not present

## 2022-10-31 MED ORDER — SULFAMETHOXAZOLE-TRIMETHOPRIM 800-160 MG PO TABS
1.0000 | ORAL_TABLET | Freq: Two times a day (BID) | ORAL | 0 refills | Status: AC
Start: 2022-10-31 — End: 2022-11-05

## 2022-10-31 NOTE — Progress Notes (Signed)
Assessment & Plan:  [name] was seen today for recurrent skin infections.  Diagnoses and all orders for this visit:  Cutaneous abscess of back excluding buttocks -     sulfamethoxazole-trimethoprim (BACTRIM DS) 800-160 MG tablet; Take 1 tablet by mouth 2 (two) times daily for 5 days. May apply OTC neosporin directly to site   Patient has been counseled on age-appropriate routine health concerns for screening and prevention. These are reviewed and up-to-date. Referrals have been placed accordingly. Immunizations are up-to-date or declined.    Subjective:   Chief Complaint  Patient presents with   Recurrent Skin Infections   HPI [name]. 57 y.o. male presents to office today with a skin nodule that has erupted on his back. He is a patient of Dr Newlin's that I am seeing today for an acute visit today.    Abscess: Patient presents for evaluation of a cutaneous abscess. Lesion is located on the upper back. Onset was over  1 month ago. Symptoms have progressed to a point and plateaued. Abscess has associated symptoms of spontaneous drainage. Patient does have previous history of cutaneous abscesses which required incision and drainage. Patient does have diabetes. Denies fever.   Review of Systems  Constitutional:  Negative for fever, malaise/fatigue and weight loss.  HENT: Negative.  Negative for nosebleeds.   Eyes: Negative.  Negative for blurred vision, double vision and photophobia.  Respiratory: Negative.  Negative for cough and shortness of breath.   Cardiovascular: Negative.  Negative for chest pain, palpitations and leg swelling.  Gastrointestinal: Negative.  Negative for heartburn, nausea and vomiting.  Musculoskeletal: Negative.  Negative for myalgias.  Skin:        SEE HPI  Neurological: Negative.  Negative for dizziness, focal weakness, seizures and headaches.  Psychiatric/Behavioral: Negative.  Negative for suicidal ideas.     Past Medical History:   Diagnosis Date   ADD (attention deficit disorder)    Anxiety    Arthritis    right hip   Bipolar 1 disorder (HCC)    Blood in urine    CKD (chronic kidney disease), stage III (HCC)    Creatinine elevation    Dementia (HCC)    "early onset" (08/04/2017)   Depression    bipolar [facility]   Diabetes mellitus without complication (HCC)    Dizziness    Family history of anesthesia complication    pt is unsure , but pt father may have been difficult to arouse    Frequent falls    HCAP (healthcare-associated pneumonia) 10/31/2012   History of kidney stones    Hypertension    Hypogonadism male    Liver fatty degeneration    Lumbar radiculopathy    Microscopic hematuria    hereditary s/p Urology eval   Neuromuscular disorder (HCC)    feet neuropathy    Osteoarthritis of right hip 11/28/2011   2012 2015 s/p THR Severe  Dr Margreta Journey     Pleural effusion 11/02/2012   Pneumonia Nov 11, 2012   Pneumonia, organism unspecified(486) 11/02/2012   Polysubstance dependence, non-opioid, in remission (HCC)    remote   Primary osteoarthritis of left hip 05/22/2015   PTSD (post-traumatic stress disorder)    SOCIAL ANXIETY DISORDER    Schizoaffective disorder (HCC)    Substance abuse (HCC)    Suicide attempt by multiple drug overdose (HCC) 01-03-16   Grieving his cat's death 2015/11/12    Past Surgical History:  Procedure Laterality Date   BACK SURGERY  CLOSED REDUCTION METACARPAL WITH PERCUTANEOUS PINNING Right    LUMBAR DISC SURGERY     TONSILLECTOMY     TOTAL HIP ARTHROPLASTY Right 08/16/2013   Procedure: TOTAL HIP ARTHROPLASTY ANTERIOR APPROACH;  Surgeon: Velna Ochs, MD;  Location: [facility] OR;  Service: Orthopedics;  Laterality: Right;   TOTAL HIP ARTHROPLASTY Left 05/22/2015   Procedure: TOTAL HIP ARTHROPLASTY ANTERIOR APPROACH;  Surgeon: Marcene Corning, MD;  Location: [facility] OR;  Service: Orthopedics;  Laterality: Left;    Family History  Problem Relation Age of Onset   Diabetes  Father    Cancer Mother        died of melanoma with mets   Cervical cancer Sister    Diabetes Sister    Other Neg Hx        hypogonadism   Colon cancer Neg Hx    Colon polyps Neg Hx    Esophageal cancer Neg Hx    Rectal cancer Neg Hx    Stomach cancer Neg Hx     Social History Reviewed with no changes to be made today.   Outpatient Medications Prior to Visit  Medication Sig Dispense Refill   acetaminophen (TYLENOL) 500 MG tablet Take 1 tablet (500 mg total) by mouth every 8 (eight) hours as needed. 90 tablet 1   alfuzosin (UROXATRAL) 10 MG 24 hr tablet Take 1 tablet (10 mg total) by mouth daily with breakfast. 90 tablet 1   ARIPiprazole (ABILIFY) 10 MG tablet Take 1 tablet (10 mg total) by mouth daily. 30 tablet 0   atorvastatin (LIPITOR) 10 MG tablet Take 1 tablet (10 mg total) by mouth daily. 90 tablet 3   buPROPion (WELLBUTRIN XL) 150 MG 24 hr tablet Take 150 mg by mouth every morning.     Cholecalciferol (VITAMIN D-3) 125 MCG (5000 UT) TABS Take 125 mcg by mouth daily.     Continuous Blood Gluc Receiver (FREESTYLE LIBRE READER) DEVI Use as directed 1 each each   Continuous Blood Gluc Sensor (FREESTYLE LIBRE 2 SENSOR) MISC by Does not apply route.     Dulaglutide (TRULICITY) 1.5 MG/0.5ML SOPN Inject 1.5 mg into the skin once a week. 6 mL 3   fenofibrate (TRICOR) 48 MG tablet Take 1 tablet (48 mg total) by mouth daily. 90 tablet 1   fluticasone (FLONASE) 50 MCG/ACT nasal spray Place 1 spray into both nostrils daily. 16 g 6   glucose blood (ACCU-CHEK GUIDE) test strip CHECK SUGAR FOUR TIMES DAILY 100 each 12   insulin glargine (LANTUS SOLOSTAR) 100 UNIT/ML Solostar Pen Lantus 37 units before b'fast (may increase by 4 units every 4 days if sugars in am are not <130) 45 mL 1   insulin lispro (HUMALOG KWIKPEN) 200 UNIT/ML KwikPen Inject 15-20 Units into the skin 3 (three) times daily before meals. 60 mL 1   Insulin Pen Needle (B-D UF III MINI PEN NEEDLES) 31G X 5 MM MISC USE 4 TIMES  DAILY 120 each 12   losartan (COZAAR) 25 MG tablet Take 25 mg by mouth daily.     magnesium oxide (MAG-OX) 400 (240 Mg) MG tablet Take 1 tablet (400 mg total) by mouth daily. 30 tablet 0   Multiple Vitamins-Minerals (MULTIVITAMIN WITH MINERALS) tablet Take 1 tablet by mouth daily.     pantoprazole (PROTONIX) 40 MG tablet Take 1 tablet (40 mg total) by mouth daily. 90 tablet 1   polyvinyl alcohol (LIQUIFILM TEARS) 1.4 % ophthalmic solution Place 1 drop into both eyes as needed for dry eyes.  15 mL 1   Testosterone 20.25 MG/1.25GM (1.62%) GEL Apply topically once a day to upper arms 112.5 g 1   traZODone (DESYREL) 100 MG tablet Take 200 mg by mouth at bedtime as needed.     No facility-administered medications prior to visit.    No Known Allergies     Objective:    BP 127/77   Pulse 79   Ht 6\' 4"  (1.93 m)   Wt 222 lb 12.8 oz (101.1 kg)   SpO2 97%   BMI 27.12 kg/m  Wt Readings from Last 3 Encounters:  10/31/22 222 lb 12.8 oz (101.1 kg)  10/29/22 213 lb (96.6 kg)  09/18/22 213 lb (96.6 kg)    Physical Exam Vitals and nursing note reviewed.  Constitutional:      Appearance: He is well-developed.  HENT:     Head: Normocephalic and atraumatic.  Cardiovascular:     Rate and Rhythm: Normal rate and regular rhythm.     Heart sounds: Normal heart sounds. No murmur heard.    No friction rub. No gallop.  Pulmonary:     Effort: Pulmonary effort is normal. No tachypnea or respiratory distress.     Breath sounds: Normal breath sounds. No decreased breath sounds, wheezing, rhonchi or rales.  Chest:     Chest wall: No tenderness.  Abdominal:     General: Bowel sounds are normal.     Palpations: Abdomen is soft.  Musculoskeletal:        General: Normal range of motion.     Cervical back: Normal range of motion.  Skin:    General: Skin is warm and dry.     Findings: Lesion (see photo) present.  Neurological:     Mental Status: He is alert and oriented to person, place, and time.      Coordination: Coordination normal.  Psychiatric:        Behavior: Behavior normal. Behavior is cooperative.        Thought Content: Thought content normal.        Judgment: Judgment normal.          Patient has been counseled extensively about nutrition and exercise as well as the importance of adherence with medications and regular follow-up. The patient was given clear instructions to go to ER or return to medical center if symptoms don't improve, worsen or new problems develop. The patient verbalized understanding.   Follow-up: Return if symptoms worsen or fail to improve.   Claiborne Rigg, FNP-BC [facility] [place], [place] [phone]   10/31/2022, 2:35 PM

## 2022-10-31 NOTE — Progress Notes (Signed)
Boil on back.

## 2022-11-09 DIAGNOSIS — E1122 Type 2 diabetes mellitus with diabetic chronic kidney disease: Secondary | ICD-10-CM | POA: Diagnosis not present

## 2022-11-11 DIAGNOSIS — F4010 Social phobia, unspecified: Secondary | ICD-10-CM | POA: Diagnosis not present

## 2022-11-14 DIAGNOSIS — L821 Other seborrheic keratosis: Secondary | ICD-10-CM | POA: Diagnosis not present

## 2022-11-14 DIAGNOSIS — L720 Epidermal cyst: Secondary | ICD-10-CM | POA: Diagnosis not present

## 2022-11-18 DIAGNOSIS — F419 Anxiety disorder, unspecified: Secondary | ICD-10-CM | POA: Diagnosis not present

## 2022-11-18 DIAGNOSIS — F332 Major depressive disorder, recurrent severe without psychotic features: Secondary | ICD-10-CM | POA: Diagnosis not present

## 2022-11-22 ENCOUNTER — Other Ambulatory Visit: Payer: Self-pay | Admitting: Internal Medicine

## 2022-11-22 DIAGNOSIS — E1122 Type 2 diabetes mellitus with diabetic chronic kidney disease: Secondary | ICD-10-CM

## 2022-11-22 DIAGNOSIS — N184 Chronic kidney disease, stage 4 (severe): Secondary | ICD-10-CM

## 2022-11-25 DIAGNOSIS — F4010 Social phobia, unspecified: Secondary | ICD-10-CM | POA: Diagnosis not present

## 2022-12-05 ENCOUNTER — Encounter: Payer: Self-pay | Admitting: Internal Medicine

## 2022-12-05 ENCOUNTER — Ambulatory Visit: Payer: Medicare HMO | Admitting: Internal Medicine

## 2022-12-05 VITALS — BP 126/76 | HR 83 | Ht 76.0 in | Wt 223.8 lb

## 2022-12-05 DIAGNOSIS — Z7985 Long-term (current) use of injectable non-insulin antidiabetic drugs: Secondary | ICD-10-CM | POA: Diagnosis not present

## 2022-12-05 DIAGNOSIS — E785 Hyperlipidemia, unspecified: Secondary | ICD-10-CM

## 2022-12-05 DIAGNOSIS — E1122 Type 2 diabetes mellitus with diabetic chronic kidney disease: Secondary | ICD-10-CM | POA: Diagnosis not present

## 2022-12-05 DIAGNOSIS — E119 Type 2 diabetes mellitus without complications: Secondary | ICD-10-CM

## 2022-12-05 DIAGNOSIS — Z794 Long term (current) use of insulin: Secondary | ICD-10-CM | POA: Diagnosis not present

## 2022-12-05 DIAGNOSIS — N184 Chronic kidney disease, stage 4 (severe): Secondary | ICD-10-CM

## 2022-12-05 LAB — HEMOGLOBIN A1C: Hemoglobin A1C: 7

## 2022-12-05 MED ORDER — TRULICITY 3 MG/0.5ML ~~LOC~~ SOAJ: 3.0000 mg | SUBCUTANEOUS | 3 refills | Status: DC

## 2022-12-05 NOTE — Patient Instructions (Addendum)
Please use the following regimen: - Lantus 40 units in am - Humalog U200: Make sure you are injecting 15 min before meals  - 20 units before protein shake in am - 15-20 units before dinner  Try to increase: - Trulicity 3 mg weekly  Please come back for a follow-up appointment in 3-4 months.

## 2022-12-05 NOTE — Progress Notes (Signed)
Patient ID: [name]., male   DOB: 02/12/1966, 57 y.o.   MRN: [id number]  HPI: [name]. is a 57 y.o.-year-old male, returning for follow-up for DM2, dx in 2012, insulin-dependent since 2017, uncontrolled, with complications (stage 4 CKD, PN, fatty liver). Last OV 5 mo ago.  Interim hx: He has no increased urination, no blurry vision, nausea, chest pain.  He had eructations at last OV >> resolved. Still has some back pain and uses a cane or a walker.  Reviewed HbA1c: Lab Results  Component Value Date   HGBA1C 7.3 (A) 07/16/2022   HGBA1C 8.7 (A) 04/10/2022   HGBA1C 8.0 (H) 12/02/2021   HGBA1C 9.4 (A) 09/02/2021   HGBA1C 7.8 (A) 06/11/2021   HGBA1C 7.1 (H) 05/27/2021   HGBA1C 8.1 (A) 11/14/2020   HGBA1C 8.0 (A) 08/30/2020   HGBA1C 7.5 (H) 07/06/2020   HGBA1C 6.4 05/11/2020   Pt was on a regimen of: - NPH 45 units in a.m. Prev. On Lantus,  Levemir, NovoLog, Humalog, Tradjenta,Metformin.  Then on: - Lantus 27 units daily (decreased 12/2021 after hospitalization) >> 37 >> 50 units in am - Humalog U200 3 >> 10-20 >> 15-20 >> 20 units 15 min before each meal - Trulicity 0.75 mg weekly  At last visit I recommended: - Lantus 20 units before b'fast and 20 units at bedtime >> but using 40 units in am as he was forgetting to bedtime dose - Humalog U200:  - 15  >> using 20 units AFTER  protein shake in am - 18-20 >> units 15-20 units AFTER  dinner - Trulicity 1.5 mg weekly  Pt checks his sugars >4x a day with the Libre CGM - from [facility]:  Previously:  Previously:   Lowest sugar was 60s >> 60s >> 65 >> 60s; he has hypoglycemia awareness at 70.  Highest sugar was 400s >> HI >> upper 200s >> 200s.  Glucometer: Accu-Chek guide  Meals: - Protein shake (20 units) - Lunch  - skips - Dinner - potato cheddar bake (15 units)  - + CKD, last BUN/creatinine:  10/13/2022: 15/1.62, GFR 50, glucose 201 Lab Results  Component Value Date   BUN 16 02/10/2022    BUN 17 01/03/2022   CREATININE 1.21 02/10/2022   CREATININE 1.16 01/03/2022  On lisinopril.  - + HL; last set of lipids: Lab Results  Component Value Date   CHOL 139 07/16/2022   HDL 43.20 07/16/2022   LDLCALC 76 07/06/2020   LDLDIRECT 62.0 07/16/2022   TRIG 285.0 (H) 07/16/2022   CHOLHDL 3 07/16/2022  On Lipitor 10 mg daily Tricor 48 mg daily. He could not take fish oil 2/2 large capsule.  - last eye exam was in  09/2022. No DR reportedly.   - no numbness and tingling in his feet.  On Neurontin 300 mg twice a day.  Last foot exam 08/26/2022- Dr Eloy End.  He also has a history of hypogonadism, HTN, back pain, bipolar disorder, PTSD, history of suicide attempt. He undergoes ECT treatments. He has a history of schizoaffective disorder and also poor memory.  He has an aide that helps him.  He gives his own insulin. He was admitted 11/2021 for pneumonia.  In 2023, he had a thyroid ultrasound showing 3 nodules, of which do need to be followed with annual ultrasounds.  This is managed by PCP.  ROS: + see HPI  Past Medical History:  Diagnosis Date   ADD (attention deficit disorder)    Anxiety  Arthritis    right hip   Bipolar 1 disorder (HCC)    Blood in urine    CKD (chronic kidney disease), stage III (HCC)    Creatinine elevation    Dementia (HCC)    "early onset" (08/04/2017)   Depression    bipolar [facility]   Diabetes mellitus without complication (HCC)    Dizziness    Family history of anesthesia complication    pt is unsure , but pt father may have been difficult to arouse    Frequent falls    HCAP (healthcare-associated pneumonia) 10/31/2012   History of kidney stones    Hypertension    Hypogonadism male    Liver fatty degeneration    Lumbar radiculopathy    Microscopic hematuria    hereditary s/p Urology eval   Neuromuscular disorder (HCC)    feet neuropathy    Osteoarthritis of right hip 11/28/2011   2012 2015 s/p THR Severe  Dr Margreta Journey     Pleural  effusion 11/02/2012   Pneumonia 10/2012   Pneumonia, organism unspecified(486) 11/02/2012   Polysubstance dependence, non-opioid, in remission (HCC)    remote   Primary osteoarthritis of left hip 05/22/2015   PTSD (post-traumatic stress disorder)    SOCIAL ANXIETY DISORDER    Schizoaffective disorder (HCC)    Substance abuse (HCC)    Suicide attempt by multiple drug overdose (HCC) 01-11-16   Grieving his cat's death December 30, 2015   Past Surgical History:  Procedure Laterality Date   BACK SURGERY     CLOSED REDUCTION METACARPAL WITH PERCUTANEOUS PINNING Right    LUMBAR DISC SURGERY     TONSILLECTOMY     TOTAL HIP ARTHROPLASTY Right 08/16/2013   Procedure: TOTAL HIP ARTHROPLASTY ANTERIOR APPROACH;  Surgeon: Velna Ochs, MD;  Location: [facility] OR;  Service: Orthopedics;  Laterality: Right;   TOTAL HIP ARTHROPLASTY Left 05/22/2015   Procedure: TOTAL HIP ARTHROPLASTY ANTERIOR APPROACH;  Surgeon: Marcene Corning, MD;  Location: [facility] OR;  Service: Orthopedics;  Laterality: Left;   Social History   Socioeconomic History   Marital status: Single    Spouse name: Not on file   Number of children: 0   Years of education: 14   Highest education level: Some college, no degree  Occupational History   Occupation: disability  Tobacco Use   Smoking status: Former    Current packs/day: 0.00    Types: Cigarettes    Start date: 12/21/2018    Quit date: 03/22/2019    Years since quitting: 3.7   Smokeless tobacco: Never  Vaping Use   Vaping status: Never Used  Substance and Sexual Activity   Alcohol use: Not Currently   Drug use: No    Comment: hx of marijuana/cocaine/crack use but sober since Dec 30, 2022   Sexual activity: Not Currently  Other Topics Concern   Not on file  Social History Narrative   05/08/2021 lives alone, sister Selena Batten helps with meds, he has some in home care, lived with sister until Nov 2020   Caffeine- sodas, amount  varies   regular exercise-no   Social Determinants of Health    Financial Resource Strain: Low Risk  (10/29/2022)   Overall Financial Resource Strain (CARDIA)    Difficulty of Paying Living Expenses: Not very hard  Food Insecurity: No Food Insecurity (10/29/2022)   Hunger Vital Sign    Worried About Running Out of Food in the Last Year: Never true    Ran Out of Food in the Last Year: Never true  Transportation Needs: No Transportation Needs (10/29/2022)   PRAPARE - Administrator, Civil Service (Medical): No    Lack of Transportation (Non-Medical): No  Physical Activity: Inactive (10/29/2022)   Exercise Vital Sign    Days of Exercise per Week: 0 days    Minutes of Exercise per Session: 0 min  Stress: Stress Concern Present (10/29/2022)   [employer] of Occupational Health - Occupational Stress Questionnaire    Feeling of Stress : To some extent  Social Connections: Socially Isolated (10/29/2022)   Social Connection and Isolation Panel [NHANES]    Frequency of Communication with Friends and Family: Once a week    Frequency of Social Gatherings with Friends and Family: Once a week    Attends Religious Services: Never    Database administrator or Organizations: Yes    Attends Banker Meetings: Never    Marital Status: Never married  Intimate Partner Violence: Not At Risk (10/29/2022)   Humiliation, Afraid, Rape, and Kick questionnaire    Fear of Current or Ex-Partner: No    Emotionally Abused: No    Physically Abused: No    Sexually Abused: No   Current Outpatient Medications on File Prior to Visit  Medication Sig Dispense Refill   acetaminophen (TYLENOL) 500 MG tablet Take 1 tablet (500 mg total) by mouth every 8 (eight) hours as needed. 90 tablet 1   alfuzosin (UROXATRAL) 10 MG 24 hr tablet Take 1 tablet (10 mg total) by mouth daily with breakfast. 90 tablet 1   ARIPiprazole (ABILIFY) 10 MG tablet Take 1 tablet (10 mg total) by mouth daily. 30 tablet 0   atorvastatin (LIPITOR) 10 MG tablet Take 1 tablet (10 mg  total) by mouth daily. 90 tablet 3   buPROPion (WELLBUTRIN XL) 150 MG 24 hr tablet Take 150 mg by mouth every morning.     Cholecalciferol (VITAMIN D-3) 125 MCG (5000 UT) TABS Take 125 mcg by mouth daily.     Continuous Blood Gluc Receiver (FREESTYLE LIBRE READER) DEVI Use as directed 1 each each   Continuous Blood Gluc Sensor (FREESTYLE LIBRE 2 SENSOR) MISC by Does not apply route.     Dulaglutide (TRULICITY) 1.5 MG/0.5ML SOPN Inject 1.5 mg into the skin once a week. 6 mL 3   fenofibrate (TRICOR) 48 MG tablet Take 1 tablet (48 mg total) by mouth daily. 90 tablet 1   fluticasone (FLONASE) 50 MCG/ACT nasal spray Place 1 spray into both nostrils daily. 16 g 6   glucose blood (ACCU-CHEK GUIDE) test strip CHECK SUGAR FOUR TIMES DAILY 100 each 12   insulin glargine (LANTUS SOLOSTAR) 100 UNIT/ML Solostar Pen Lantus 20 units before b'fast and 20 units at bedtime 45 mL 1   insulin lispro (HUMALOG KWIKPEN) 200 UNIT/ML KwikPen Inject 15-20 Units into the skin 3 (three) times daily before meals. 60 mL 1   Insulin Pen Needle (B-D UF III MINI PEN NEEDLES) 31G X 5 MM MISC USE 4 TIMES DAILY 120 each 12   losartan (COZAAR) 25 MG tablet Take 25 mg by mouth daily.     magnesium oxide (MAG-OX) 400 (240 Mg) MG tablet Take 1 tablet (400 mg total) by mouth daily. 30 tablet 0   Multiple Vitamins-Minerals (MULTIVITAMIN WITH MINERALS) tablet Take 1 tablet by mouth daily.     pantoprazole (PROTONIX) 40 MG tablet Take 1 tablet (40 mg total) by mouth daily. 90 tablet 1   polyvinyl alcohol (LIQUIFILM TEARS) 1.4 % ophthalmic solution Place 1 drop  into both eyes as needed for dry eyes. 15 mL 1   Testosterone 20.25 MG/1.25GM (1.62%) GEL Apply topically once a day to upper arms 112.5 g 1   traZODone (DESYREL) 100 MG tablet Take 200 mg by mouth at bedtime as needed.     No current facility-administered medications on file prior to visit.   No Known Allergies  Family History  Problem Relation Age of Onset   Diabetes Father     Cancer Mother        died of melanoma with mets   Cervical cancer Sister    Diabetes Sister    Other Neg Hx        hypogonadism   Colon cancer Neg Hx    Colon polyps Neg Hx    Esophageal cancer Neg Hx    Rectal cancer Neg Hx    Stomach cancer Neg Hx    PE: BP 126/76   Pulse 83   Ht 6\' 4"  (1.93 m)   Wt 223 lb 12.8 oz (101.5 kg)   SpO2 99%   BMI 27.24 kg/m  Wt Readings from Last 3 Encounters:  12/05/22 223 lb 12.8 oz (101.5 kg)  10/31/22 222 lb 12.8 oz (101.1 kg)  10/29/22 213 lb (96.6 kg)   Constitutional: overweight, in NAD, walks with a cane Eyes:  EOMI, no exophthalmos ENT: no neck masses, no cervical lymphadenopathy Cardiovascular: RRR, No MRG Respiratory: CTA B Musculoskeletal: no deformities Skin:no rashes Neurological: no tremor with outstretched hands  ASSESSMENT: 1. DM2, insulin-dependent, uncontrolled, with complications - CKD - PN - fatty liver  2. HL  PLAN:  1. Patient with longstanding, uncontrolled, type 2 diabetes, on basal/bolus insulin regimen and also weekly GLP-1 receptor agonist, with improving control.  At last visit, HbA1c was 7.3%.  We switched him to Trulicity after Ozempic was on backorder.  He had eructations, for which she was taking Mylanta, but these resolved, even though he is using a higher dose of Trulicity, 1.5 mg weekly.  At last visit I advised him to split the Lantus dose into 2 doses taken approximately 12 hours apart and discussed about taking the Humalog 15 minutes before meals.  He tried to split Lantus but she was forgetting the evening dose so he is again taking 40 units in the morning.  He forgot about the recommendation to take Humalog before meals so he takes it after meals. CGM interpretation: -At today's visit, we reviewed his CGM downloads: It appears that 83% of values are in target range (goal >70%), while 17% are higher than 180 (goal <25%), and 0% are lower than 70 (goal <4%).  The calculated average blood sugar is  147.  The projected HbA1c for the next 3 months (GMI) is 6.8%. -Reviewing the CGM trends, sugars appear to be much improved from before.  He still have some hyperglycemic spikes especially after morning protein shake and less so after dinner and these are followed by a decrease in blood sugars sometimes to the 70s.  Upon questioning, he is injecting insulin after his meals and we discussed about the importance of bolusing 15 minutes before the meals.  He is also interested in increasing the dose of Trulicity to see if this can help with weight loss.  I advised him that he may need to reduce the dose of insulin after this.  Advised him to let me know if sugars start dropping too low. - I suggested to:  Patient Instructions  Please use the following regimen: -  Lantus 40 units in am - Humalog U200: Make sure you are injecting 15 min before meals  - 20 units before protein shake in am - 15-20 units before dinner  Try to increase: - Trulicity 3 mg weekly  Please come back for a follow-up appointment in 3-4 months.   - we checked his HbA1c: 7.0% (better) - advised to check sugars at different times of the day - 4x a day, rotating check times - advised for yearly eye exams >> he is UTD - return to clinic in 3-4 months  2. HL -Reviewed latest lipid panel from 06/2022: Triglycerides elevated, LDL at goal: Lab Results  Component Value Date   CHOL 139 07/16/2022   HDL 43.20 07/16/2022   LDLCALC 76 07/06/2020   LDLDIRECT 62.0 07/16/2022   TRIG 285.0 (H) 07/16/2022   CHOLHDL 3 07/16/2022  -Continues Lipitor 10 mg daily without side effects.  Last visit I also suggested fish oil 1000 mg twice a day, but he found the capsules to large.  He was started back on Tricor by PCP, 48 mg daily.  Carlus Pavlov, MD PhD [facility] Endocrinology

## 2022-12-09 DIAGNOSIS — F4010 Social phobia, unspecified: Secondary | ICD-10-CM | POA: Diagnosis not present

## 2022-12-16 ENCOUNTER — Ambulatory Visit (INDEPENDENT_AMBULATORY_CARE_PROVIDER_SITE_OTHER): Payer: Medicare HMO | Admitting: Podiatry

## 2022-12-16 DIAGNOSIS — M79674 Pain in right toe(s): Secondary | ICD-10-CM | POA: Diagnosis not present

## 2022-12-16 DIAGNOSIS — B351 Tinea unguium: Secondary | ICD-10-CM | POA: Diagnosis not present

## 2022-12-16 DIAGNOSIS — M79675 Pain in left toe(s): Secondary | ICD-10-CM

## 2022-12-16 NOTE — Progress Notes (Unsigned)
Subjective:  Patient ID: [name]., male    DOB: July 11, 1965,  MRN: [id number]   Terrace Arabia Erven Colla. presents to clinic today for:  Chief Complaint  Patient presents with   Nail Problem    [facility]  . Patient notes nails are thick, discolored, elongated and painful in shoegear when trying to ambulate.    PCP is Hoy Register, MD.  Past Medical History:  Diagnosis Date   ADD (attention deficit disorder)    Anxiety    Arthritis    right hip   Bipolar 1 disorder (HCC)    Blood in urine    CKD (chronic kidney disease), stage III (HCC)    Creatinine elevation    Dementia (HCC)    "early onset" (08/04/2017)   Depression    bipolar [facility]   Diabetes mellitus without complication (HCC)    Dizziness    Family history of anesthesia complication    pt is unsure , but pt father may have been difficult to arouse    Frequent falls    HCAP (healthcare-associated pneumonia) 10/31/2012   History of kidney stones    Hypertension    Hypogonadism male    Liver fatty degeneration    Lumbar radiculopathy    Microscopic hematuria    hereditary s/p Urology eval   Neuromuscular disorder (HCC)    feet neuropathy    Osteoarthritis of right hip 11/28/2011   2012 2015 s/p THR Severe  Dr Margreta Journey     Pleural effusion 11/02/2012   Pneumonia 10/2012   Pneumonia, organism unspecified(486) 11/02/2012   Polysubstance dependence, non-opioid, in remission (HCC)    remote   Primary osteoarthritis of left hip 05/22/2015   PTSD (post-traumatic stress disorder)    SOCIAL ANXIETY DISORDER    Schizoaffective disorder (HCC)    Substance abuse (HCC)    Suicide attempt by multiple drug overdose (HCC) Jan 18, 2016   Grieving his cat's death Jan 13, 2016    No Known Allergies  Review of Systems: Negative except as noted in the HPI.  Objective:  There were no vitals filed for this visit.  [name]. is a pleasant 57 y.o. male in NAD. AAO x 3.  Vascular  Examination: Capillary refill time is 3-5 seconds to toes bilateral. Palpable pedal pulses b/l LE. Digital hair present b/l.  Skin temperature gradient WNL b/l. No varicosities b/l. No cyanosis noted b/l.   Dermatological Examination: Pedal skin with normal turgor, texture and tone b/l. No open wounds. No interdigital macerations b/l. Toenails x10 are 3mm thick, discolored, dystrophic with subungual debris. There is pain with compression of the nail plates.  They are elongated x10     Latest Ref Rng & Units 07/16/2022   11:51 AM 04/10/2022   11:19 AM  Hemoglobin A1C  Hemoglobin-A1c 4.0 - 5.6 % 7.3  8.7    Assessment/Plan: 1. Pain due to onychomycosis of toenails of both feet     The mycotic toenails were sharply debrided x10 with sterile nail nippers and a power debriding burr to decrease bulk/thickness and length.    Patient thinks he is already scheduled with Dr. Donzetta Matters for his next appointment.  Clerance Lav, DPM, FACFAS [facility]     [address]  [place], [place] [address]                Office [phone]  Fax [phone]

## 2022-12-30 DIAGNOSIS — F4010 Social phobia, unspecified: Secondary | ICD-10-CM | POA: Diagnosis not present

## 2023-01-13 DIAGNOSIS — R6889 Other general symptoms and signs: Secondary | ICD-10-CM | POA: Diagnosis not present

## 2023-01-13 DIAGNOSIS — F4010 Social phobia, unspecified: Secondary | ICD-10-CM | POA: Diagnosis not present

## 2023-01-21 ENCOUNTER — Other Ambulatory Visit: Payer: Self-pay | Admitting: Family Medicine

## 2023-01-22 NOTE — Telephone Encounter (Signed)
Requested Prescriptions  Pending Prescriptions Disp Refills   fluticasone (FLONASE) 50 MCG/ACT nasal spray [Pharmacy Med Name: Fluticasone Propionate Nasal Suspension 50 MCG/ACT] 32 g 2    Sig: USE 1 SPRAY IN EACH NOSTRIL EVERY DAY     Ear, Nose, and Throat: Nasal Preparations - Corticosteroids Passed - 01/21/2023 10:01 PM      Passed - Valid encounter within last 12 months    Recent Outpatient Visits           2 months ago Cutaneous abscess of back excluding buttocks   [place] [facility] [place], Shea Stakes, NP   4 months ago Schizoaffective disorder, bipolar type [facility])   [place] [facility] [place], Odette Horns, MD   9 months ago Type 2 diabetes mellitus with stage 3a chronic kidney disease, with long-term current use of insulin (HCC)   [place] [facility] Hoy Register, MD   11 months ago Hospital discharge follow-up   [facility] Marcine Matar, MD   11 months ago Abscess, scalp   [place] [facility] Marcine Matar, MD       Future Appointments             In 2 months Hoy Register, MD [facility]

## 2023-01-27 DIAGNOSIS — R6889 Other general symptoms and signs: Secondary | ICD-10-CM | POA: Diagnosis not present

## 2023-01-27 DIAGNOSIS — F4010 Social phobia, unspecified: Secondary | ICD-10-CM | POA: Diagnosis not present

## 2023-01-28 DIAGNOSIS — N401 Enlarged prostate with lower urinary tract symptoms: Secondary | ICD-10-CM | POA: Diagnosis not present

## 2023-01-28 DIAGNOSIS — R6889 Other general symptoms and signs: Secondary | ICD-10-CM | POA: Diagnosis not present

## 2023-01-28 DIAGNOSIS — R351 Nocturia: Secondary | ICD-10-CM | POA: Diagnosis not present

## 2023-01-28 DIAGNOSIS — N5201 Erectile dysfunction due to arterial insufficiency: Secondary | ICD-10-CM | POA: Diagnosis not present

## 2023-02-05 ENCOUNTER — Other Ambulatory Visit: Payer: Self-pay | Admitting: Family Medicine

## 2023-02-05 ENCOUNTER — Other Ambulatory Visit: Payer: Self-pay | Admitting: Internal Medicine

## 2023-02-05 DIAGNOSIS — N184 Chronic kidney disease, stage 4 (severe): Secondary | ICD-10-CM

## 2023-02-05 DIAGNOSIS — E781 Pure hyperglyceridemia: Secondary | ICD-10-CM

## 2023-02-05 DIAGNOSIS — E1122 Type 2 diabetes mellitus with diabetic chronic kidney disease: Secondary | ICD-10-CM

## 2023-02-05 NOTE — Telephone Encounter (Signed)
Labs in date  Requested Prescriptions  Pending Prescriptions Disp Refills   fenofibrate (TRICOR) 48 MG tablet [Pharmacy Med Name: Fenofibrate Oral Tablet 48 MG] 90 tablet 3    Sig: TAKE 1 TABLET EVERY DAY     Cardiovascular:  Antilipid - Fibric Acid Derivatives Failed - 02/05/2023  3:40 AM      Failed - Lipid Panel in normal range within the last 12 months    Cholesterol, Total  Date Value Ref Range Status  07/06/2020 167 100 - 199 mg/dL Final   Cholesterol  Date Value Ref Range Status  07/16/2022 139 0 - 200 mg/dL Final    Comment:    ATP III Classification       Desirable:  < 200 mg/dL               Borderline High:  200 - 239 mg/dL          High:  > = 409 mg/dL   LDL Chol Calc (NIH)  Date Value Ref Range Status  07/06/2020 76 0 - 99 mg/dL Final   Direct LDL  Date Value Ref Range Status  07/16/2022 62.0 mg/dL Final    Comment:    Optimal:  <100 mg/dLNear or Above Optimal:  100-129 mg/dLBorderline High:  130-159 mg/dLHigh:  160-189 mg/dLVery High:  >190 mg/dL   HDL  Date Value Ref Range Status  07/16/2022 43.20 >39.00 mg/dL Final  81/19/1478 39 (L) >39 mg/dL Final   Triglycerides  Date Value Ref Range Status  07/16/2022 285.0 (H) 0.0 - 149.0 mg/dL Final    Comment:    Normal:  <150 mg/dLBorderline High:  150 - 199 mg/dL         Passed - ALT in normal range and within 360 days    ALT  Date Value Ref Range Status  02/10/2022 23 0 - 44 IU/L Final         Passed - AST in normal range and within 360 days    AST  Date Value Ref Range Status  02/10/2022 20 0 - 40 IU/L Final         Passed - Cr in normal range and within 360 days    Creatinine, Ser  Date Value Ref Range Status  02/10/2022 1.21 0.76 - 1.27 mg/dL Final   Creatinine,U  Date Value Ref Range Status  04/18/2015 250.2 mg/dL Final   Creatinine, Urine  Date Value Ref Range Status  10/03/2015 35.87 mg/dL Final         Passed - HGB in normal range and within 360 days    Hemoglobin  Date Value Ref  Range Status  02/10/2022 15.3 13.0 - 17.7 g/dL Final         Passed - HCT in normal range and within 360 days    Hematocrit  Date Value Ref Range Status  02/10/2022 45.6 37.5 - 51.0 % Final         Passed - PLT in normal range and within 360 days    Platelets  Date Value Ref Range Status  02/10/2022 177 150 - 450 x10E3/uL Final         Passed - WBC in normal range and within 360 days    WBC  Date Value Ref Range Status  02/10/2022 7.9 3.4 - 10.8 x10E3/uL Final  01/02/2022 3.7 (L) 4.0 - 10.5 K/uL Final         Passed - eGFR is 30 or above and within 360 days    GFR  calc Af Amer  Date Value Ref Range Status  01/04/2020 >60 >60 mL/min Final   GFR, Estimated  Date Value Ref Range Status  01/03/2022 >60 >60 mL/min Final    Comment:    (NOTE) Calculated using the CKD-EPI Creatinine Equation (2021)    GFR  Date Value Ref Range Status  06/02/2016 33.36 (L) >60.00 mL/min Final   eGFR  Date Value Ref Range Status  02/10/2022 71 >59 mL/min/1.73 Final   EGFR  Date Value Ref Range Status  10/17/2022 50.0  Final    Comment:    Abstracted by HIM         Passed - Valid encounter within last 12 months    Recent Outpatient Visits           3 months ago Cutaneous abscess of back excluding buttocks   [place] [facility] [place], Shea Stakes, NP   4 months ago Schizoaffective disorder, bipolar type [facility])   [place] [facility] [place], Odette Horns, MD   9 months ago Type 2 diabetes mellitus with stage 3a chronic kidney disease, with long-term current use of insulin (HCC)   [place] [facility] Hoy Register, MD   12 months ago Hospital discharge follow-up   [facility] Marcine Matar, MD   1 year ago Abscess, scalp   [place] [facility] Marcine Matar, MD       Future Appointments             In 1 month Hoy Register, MD [facility]  [facility]

## 2023-02-07 ENCOUNTER — Other Ambulatory Visit: Payer: Self-pay | Admitting: Family Medicine

## 2023-02-07 DIAGNOSIS — E1122 Type 2 diabetes mellitus with diabetic chronic kidney disease: Secondary | ICD-10-CM | POA: Diagnosis not present

## 2023-02-07 DIAGNOSIS — E1169 Type 2 diabetes mellitus with other specified complication: Secondary | ICD-10-CM

## 2023-02-09 NOTE — Telephone Encounter (Signed)
Labs in date.   Has an upcoming appt.  Requested Prescriptions  Pending Prescriptions Disp Refills   atorvastatin (LIPITOR) 10 MG tablet [Pharmacy Med Name: Atorvastatin Calcium Oral Tablet 10 MG] 90 tablet 0    Sig: TAKE 1 TABLET EVERY DAY     Cardiovascular:  Antilipid - Statins Failed - 02/07/2023  7:39 AM      Failed - Lipid Panel in normal range within the last 12 months    Cholesterol, Total  Date Value Ref Range Status  07/06/2020 167 100 - 199 mg/dL Final   Cholesterol  Date Value Ref Range Status  07/16/2022 139 0 - 200 mg/dL Final    Comment:    ATP III Classification       Desirable:  < 200 mg/dL               Borderline High:  200 - 239 mg/dL          High:  > = 629 mg/dL   LDL Chol Calc (NIH)  Date Value Ref Range Status  07/06/2020 76 0 - 99 mg/dL Final   Direct LDL  Date Value Ref Range Status  07/16/2022 62.0 mg/dL Final    Comment:    Optimal:  <100 mg/dLNear or Above Optimal:  100-129 mg/dLBorderline High:  130-159 mg/dLHigh:  160-189 mg/dLVery High:  >190 mg/dL   HDL  Date Value Ref Range Status  07/16/2022 43.20 >39.00 mg/dL Final  52/84/1324 39 (L) >39 mg/dL Final   Triglycerides  Date Value Ref Range Status  07/16/2022 285.0 (H) 0.0 - 149.0 mg/dL Final    Comment:    Normal:  <150 mg/dLBorderline High:  150 - 199 mg/dL         Passed - Patient is not pregnant      Passed - Valid encounter within last 12 months    Recent Outpatient Visits           3 months ago Cutaneous abscess of back excluding buttocks   [place] [facility] [place], Shea Stakes, NP   4 months ago Schizoaffective disorder, bipolar type [facility])   [place] [facility] [place], Odette Horns, MD   9 months ago Type 2 diabetes mellitus with stage 3a chronic kidney disease, with long-term current use of insulin (HCC)   [place] [facility] Hoy Register, MD   12 months ago Hospital discharge follow-up   [facility] Marcine Matar, MD   1 year ago Abscess, scalp   [place] [facility] Marcine Matar, MD       Future Appointments             In 1 month Hoy Register, MD [facility]

## 2023-02-10 DIAGNOSIS — F4010 Social phobia, unspecified: Secondary | ICD-10-CM | POA: Diagnosis not present

## 2023-02-10 DIAGNOSIS — R6889 Other general symptoms and signs: Secondary | ICD-10-CM | POA: Diagnosis not present

## 2023-02-17 ENCOUNTER — Encounter: Payer: Self-pay | Admitting: Gastroenterology

## 2023-02-24 DIAGNOSIS — F4010 Social phobia, unspecified: Secondary | ICD-10-CM | POA: Diagnosis not present

## 2023-02-28 IMAGING — CT CT L SPINE W/O CM
3 series · 11 of 33 positions shown, 13 images · non-contrast
Comparison: MRI 12/01/2019

CLINICAL DATA: Low back pain

EXAM:
CT LUMBAR SPINE WITHOUT CONTRAST
TECHNIQUE: Multidetector CT imaging of the lumbar spine was performed without
intravenous contrast administration. Multiplanar CT image
reconstructions were also generated.

[Series 5: l-spine wo soft tissue · axial · 0.38mm/px · z∈[-397,-211]mm · 3 of 153 slices shown, 4 images]
[im 36/153  soft-tissue]
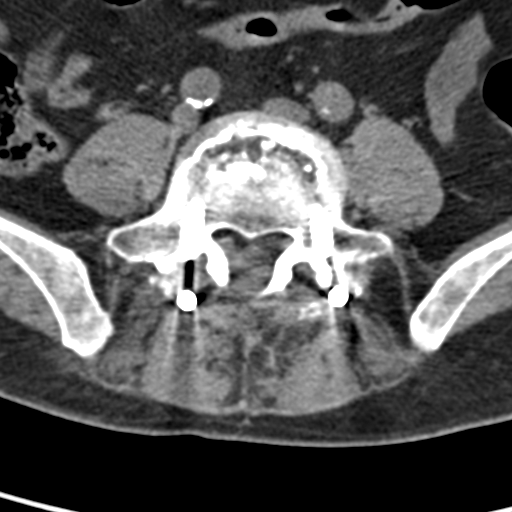
[im 36/153  bone]
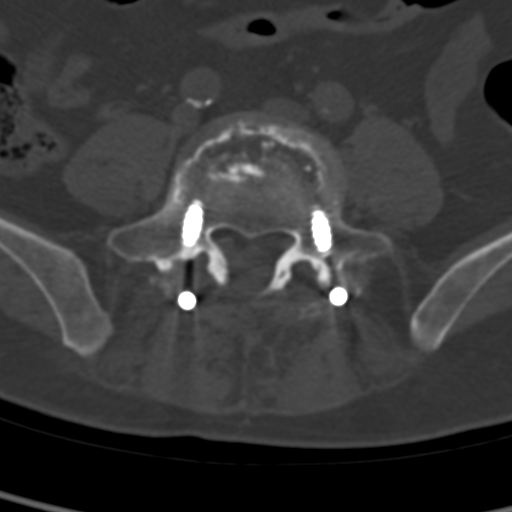
[im 82/153  bone]
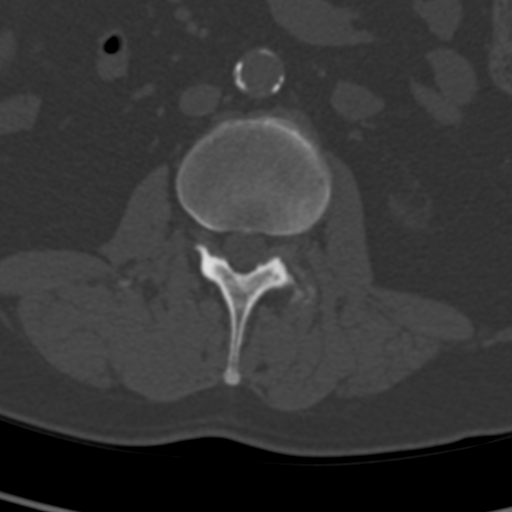
[im 129/153  bone]
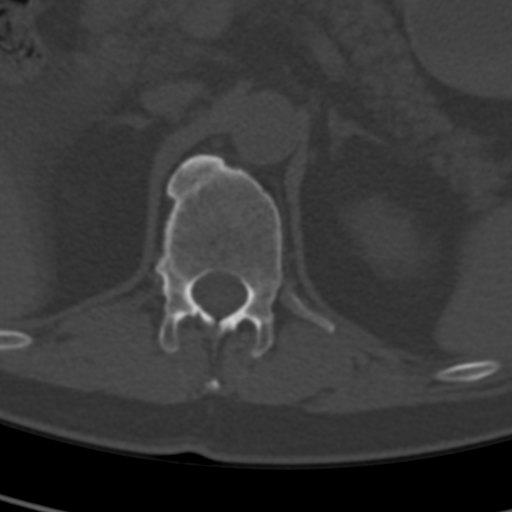

[Series 6: coronal bone · coronal · 0.35mm/px · 3 of 76 slices shown]
[im 16/76  bone]
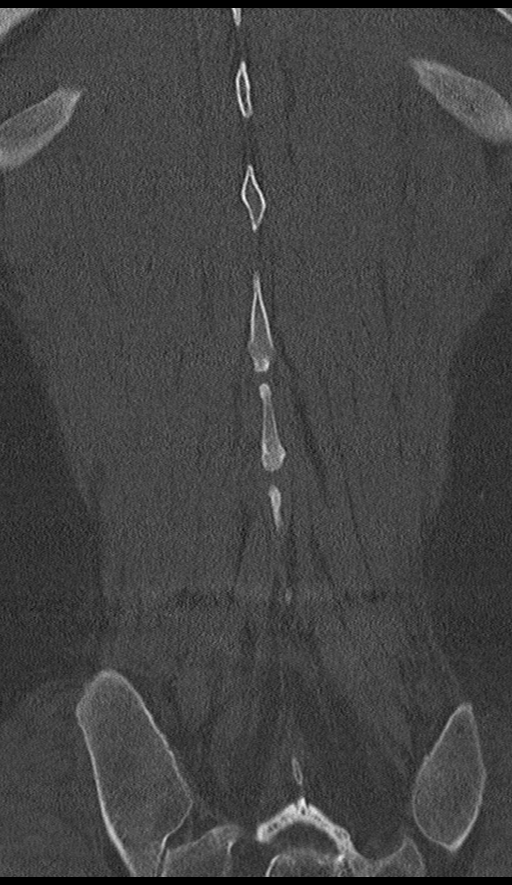
[im 31/76  bone]
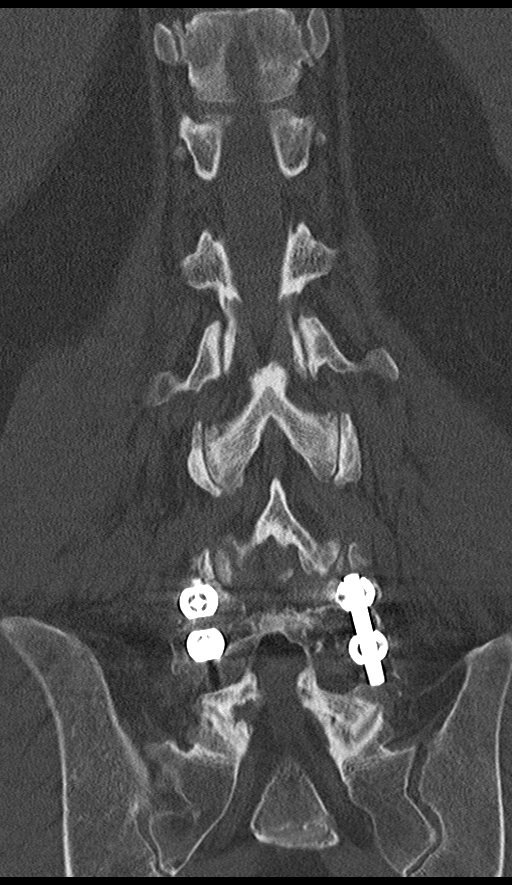
[im 46/76  bone]
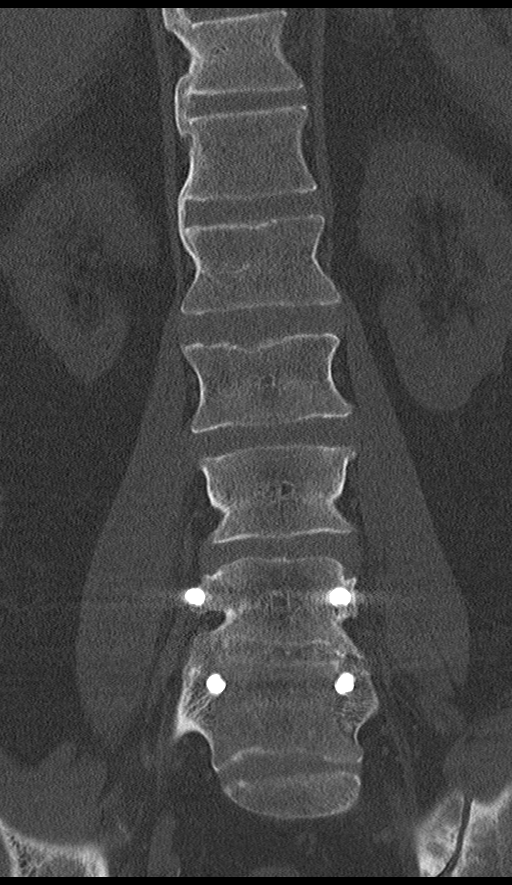

[Series 7: sagittal bone · sagittal · 0.36mm/px · 5 of 74 slices shown, 6 images]
[im 25/74  bone]
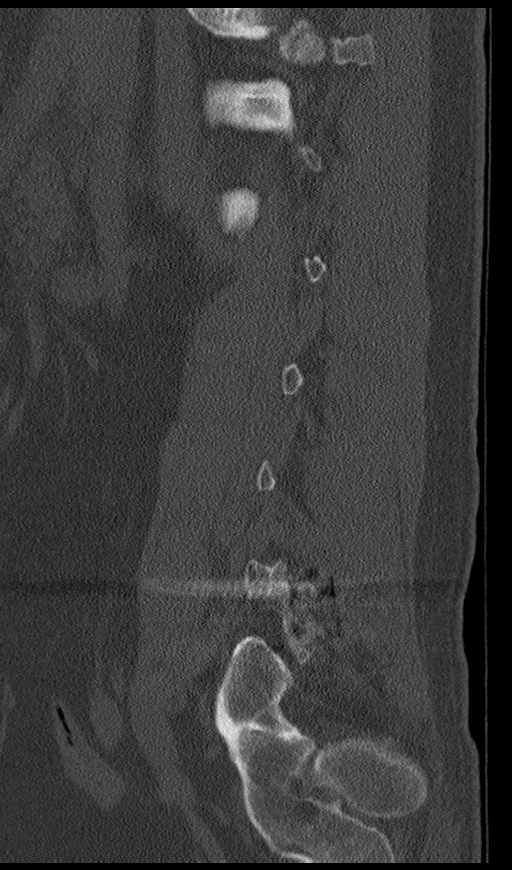
[im 31/74  bone]
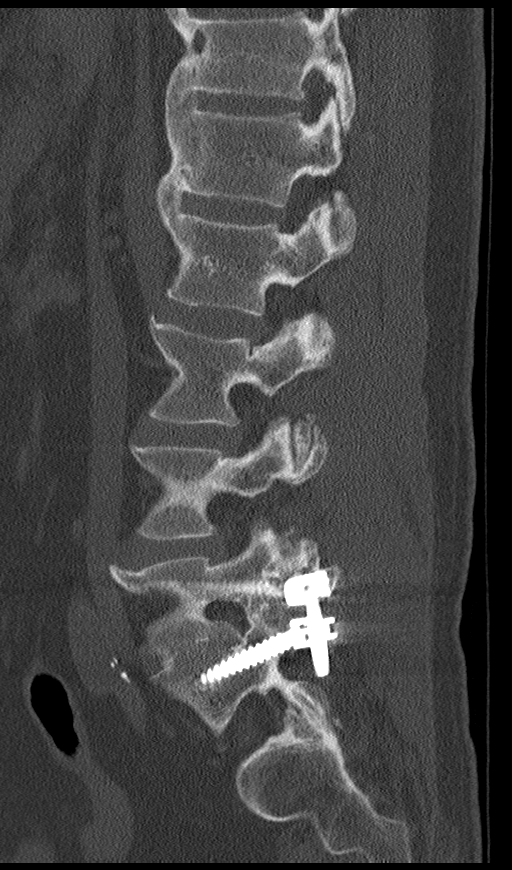
[im 37/74  soft-tissue]
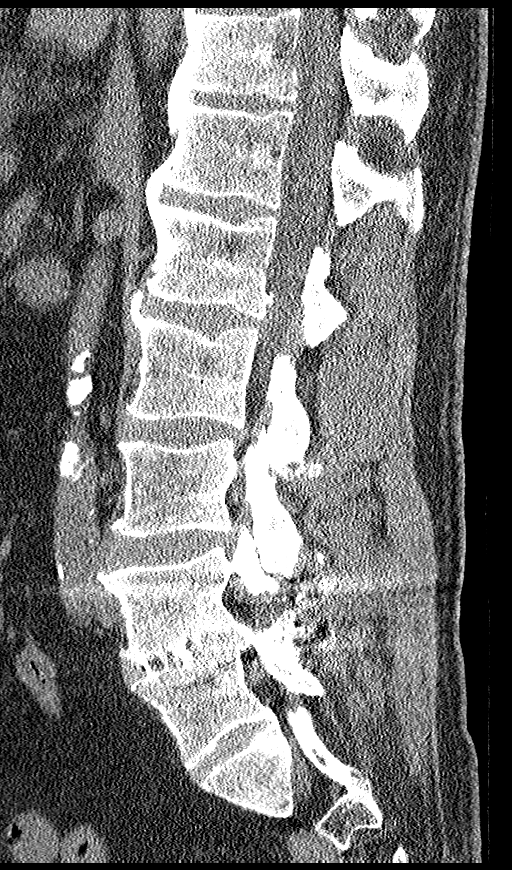
[im 37/74  bone]
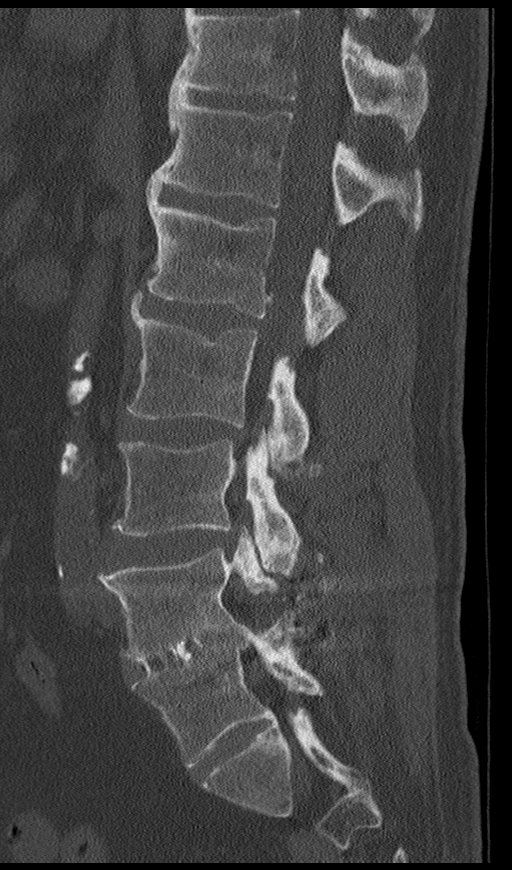
[im 43/74  bone]
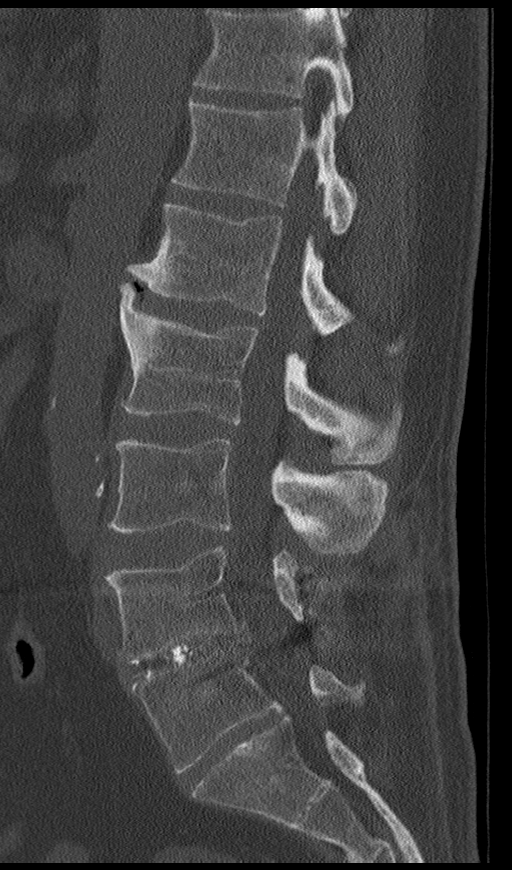
[im 49/74  bone]
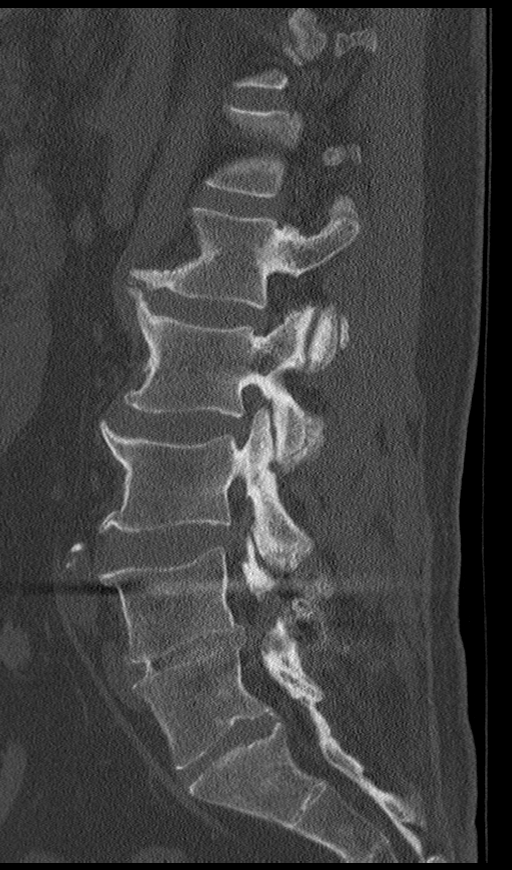

[11 of 33 positions shown; findings below may reference images not displayed]

FINDINGS: Segmentation: 5 lumbar type vertebrae.

Alignment: Normal.

Vertebrae: No acute fracture or focal pathologic process.

Paraspinal and other soft tissues: No paravertebral or paraspinal
soft tissue abnormality. Right kidney stone without hydronephrosis.
Aortic atherosclerosis.

Disc levels:

At T12-L1, maintained disc space. No canal stenosis. The foramen are
patent.

At L1-L2, mild diffuse disc bulge. No canal stenosis. Facet
degenerative changes. Right paracentral calcified material, may
correspond to the MRI extrusion.

At L2-L3, maintained disc space. Mild diffuse disc bulge.
Hypertrophic facet degenerative changes. Mild bilateral foraminal
narrowing.

At L3-L4, maintained disc space. Diffuse disc bulge. Ligamentum
flavum thickening and hypertrophic facet degenerative changes. Canal
appears mildly narrowed. There is mild bilateral foraminal
narrowing.

At L4-L5, posterior fusion hardware.  Canal appears patent.

At L5-S1, disc space narrowing. No significant canal stenosis. Facet
degenerative change. The foramen appear patent bilaterally.
IMPRESSION: 1. Postsurgical changes at L4-L5 with multilevel degenerative
changes as previously described.
2. No acute osseous abnormality.

## 2023-03-03 DIAGNOSIS — F331 Major depressive disorder, recurrent, moderate: Secondary | ICD-10-CM | POA: Diagnosis not present

## 2023-03-10 DIAGNOSIS — F4010 Social phobia, unspecified: Secondary | ICD-10-CM | POA: Diagnosis not present

## 2023-03-12 ENCOUNTER — Ambulatory Visit: Payer: Medicare HMO

## 2023-03-12 VITALS — Ht 76.0 in | Wt 225.0 lb

## 2023-03-12 DIAGNOSIS — Z860100 Personal history of colon polyps, unspecified: Secondary | ICD-10-CM

## 2023-03-12 MED ORDER — PEG 3350-KCL-NA BICARB-NACL 420 G PO SOLR: 4000.0000 mL | Freq: Once | ORAL | 0 refills | Status: AC

## 2023-03-12 NOTE — Progress Notes (Signed)
No egg or soy allergy known to patient  No issues known to pt with past sedation with any surgeries or procedures Patient denies ever being told they had issues or difficulty with intubation  No FH of Malignant Hyperthermia Pt is not on diet pills Pt is not on  home 02  Pt is not on blood thinners  Pt denies issues with constipation  No A fib or A flutter Have any cardiac testing pending--no Pt instructed to use Singlecare.com or GoodRx for a price reduction on prep  Patient uses cane for ambulating

## 2023-03-17 ENCOUNTER — Encounter: Payer: Self-pay | Admitting: Podiatry

## 2023-03-17 ENCOUNTER — Ambulatory Visit (INDEPENDENT_AMBULATORY_CARE_PROVIDER_SITE_OTHER): Payer: Medicare HMO | Admitting: Podiatry

## 2023-03-17 DIAGNOSIS — M79675 Pain in left toe(s): Secondary | ICD-10-CM | POA: Diagnosis not present

## 2023-03-17 DIAGNOSIS — B351 Tinea unguium: Secondary | ICD-10-CM | POA: Diagnosis not present

## 2023-03-17 DIAGNOSIS — E1142 Type 2 diabetes mellitus with diabetic polyneuropathy: Secondary | ICD-10-CM | POA: Diagnosis not present

## 2023-03-17 DIAGNOSIS — M79674 Pain in right toe(s): Secondary | ICD-10-CM

## 2023-03-17 DIAGNOSIS — R6889 Other general symptoms and signs: Secondary | ICD-10-CM | POA: Diagnosis not present

## 2023-03-18 ENCOUNTER — Ambulatory Visit: Payer: Medicare HMO | Admitting: Podiatry

## 2023-03-21 NOTE — Progress Notes (Signed)
  Subjective:  Patient ID: [name]., male    DOB: [birth year],  MRN: [id number]  57 y.o. male presents to clinic with  at risk foot care with history of diabetic neuropathy and painful elongated mycotic toenails 1-5 bilaterally which are tender when wearing enclosed shoe gear. Pain is relieved with periodic professional debridement.  Chief Complaint  Patient presents with   Diabetes    PATIENT STATES HE HAS BEEN OK SINCE LAST VISIT , PATIENT SEEN PCP IN MAY 30 TH OF THIS YEAR , PATIENT A1C IS 259     New problem(s): None   PCP is Hoy Register, MD.  No Known Allergies  Review of Systems: Negative except as noted in the HPI.   Objective:  Ancel Shone. is a pleasant 57 y.o. male WD, WN in NAD.Marland Kitchen AAO x 3.  Vascular Examination: Vascular status intact b/l with palpable pedal pulses. CFT immediate b/l. No edema. No pain with calf compression b/l. Skin temperature gradient WNL b/l. No cyanosis or clubbing noted b/l LE.  Neurological Examination: Sensation grossly intact b/l with 10 gram monofilament. Vibratory sensation intact b/l. Pt has subjective symptoms of neuropathy.  Dermatological Examination: Pedal skin with normal turgor, texture and tone b/l. Toenails 1-5 b/l thick, discolored, elongated with subungual debris and pain on dorsal palpation. No hyperkeratotic lesions noted b/l.   Musculoskeletal Examination: Muscle strength 5/5 to b/l LE. Hammertoe(s) noted to the of both feet. Utilizes rollator for ambulation assistance.  Radiographs: None  Last A1c:      Latest Ref Rng & Units 07/16/2022   11:51 AM 04/10/2022   11:19 AM  Hemoglobin A1C  Hemoglobin-A1c 4.0 - 5.6 % 7.3  8.7      Assessment:   1. Pain due to onychomycosis of toenails of both feet   2. Diabetic peripheral neuropathy associated with type 2 diabetes mellitus (HCC)    Plan:  -Consent given for treatment as described below: -Examined patient. -Continue foot and shoe inspections  daily. Monitor blood glucose per PCP/Endocrinologist's recommendations. -Mycotic toenails 1-5 bilaterally were debrided in length and girth with sterile nail nippers and dremel without incident. -Patient/POA to call should there be question/concern in the interim.  Return in about 3 months (around 06/17/2023).  Freddie Breech, DPM      [facility] LOCATION: [address], [place] [address]                   Office [phone]   [facility] LOCATION: [address] [place], [place] [address] Office [phone]

## 2023-03-24 DIAGNOSIS — F4010 Social phobia, unspecified: Secondary | ICD-10-CM | POA: Diagnosis not present

## 2023-03-26 ENCOUNTER — Encounter: Payer: Self-pay | Admitting: Family Medicine

## 2023-03-26 ENCOUNTER — Ambulatory Visit: Payer: Medicare HMO | Attending: Family Medicine | Admitting: Family Medicine

## 2023-03-26 VITALS — BP 133/83 | HR 85 | Ht 76.0 in | Wt 230.0 lb

## 2023-03-26 DIAGNOSIS — R6889 Other general symptoms and signs: Secondary | ICD-10-CM | POA: Diagnosis not present

## 2023-03-26 DIAGNOSIS — Z7985 Long-term (current) use of injectable non-insulin antidiabetic drugs: Secondary | ICD-10-CM | POA: Diagnosis not present

## 2023-03-26 DIAGNOSIS — R338 Other retention of urine: Secondary | ICD-10-CM

## 2023-03-26 DIAGNOSIS — E1122 Type 2 diabetes mellitus with diabetic chronic kidney disease: Secondary | ICD-10-CM

## 2023-03-26 DIAGNOSIS — Z794 Long term (current) use of insulin: Secondary | ICD-10-CM

## 2023-03-26 DIAGNOSIS — F250 Schizoaffective disorder, bipolar type: Secondary | ICD-10-CM | POA: Diagnosis not present

## 2023-03-26 DIAGNOSIS — E1169 Type 2 diabetes mellitus with other specified complication: Secondary | ICD-10-CM | POA: Diagnosis not present

## 2023-03-26 DIAGNOSIS — N1831 Chronic kidney disease, stage 3a: Secondary | ICD-10-CM

## 2023-03-26 DIAGNOSIS — K219 Gastro-esophageal reflux disease without esophagitis: Secondary | ICD-10-CM | POA: Diagnosis not present

## 2023-03-26 DIAGNOSIS — N401 Enlarged prostate with lower urinary tract symptoms: Secondary | ICD-10-CM | POA: Diagnosis not present

## 2023-03-26 DIAGNOSIS — Z23 Encounter for immunization: Secondary | ICD-10-CM | POA: Diagnosis not present

## 2023-03-26 DIAGNOSIS — E785 Hyperlipidemia, unspecified: Secondary | ICD-10-CM

## 2023-03-26 MED ORDER — ATORVASTATIN CALCIUM 10 MG PO TABS: 10.0000 mg | ORAL_TABLET | Freq: Every day | ORAL | 1 refills | Status: DC

## 2023-03-26 MED ORDER — LOSARTAN POTASSIUM 25 MG PO TABS: 25.0000 mg | ORAL_TABLET | Freq: Every day | ORAL | 1 refills | Status: DC

## 2023-03-26 MED ORDER — PANTOPRAZOLE SODIUM 40 MG PO TBEC: 40.0000 mg | DELAYED_RELEASE_TABLET | Freq: Every day | ORAL | 1 refills | Status: DC

## 2023-03-26 MED ORDER — ALFUZOSIN HCL ER 10 MG PO TB24: 10.0000 mg | ORAL_TABLET | Freq: Every day | ORAL | 1 refills | Status: DC

## 2023-03-26 NOTE — Progress Notes (Signed)
Subjective:  Patient ID: [name]., male    DOB: Feb 01, 1966  Age: 57 y.o. MRN: [id number]  CC: Medical Management of Chronic Issues   HPI Zabdi Kasal. is a 57 y.o. 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    Interval History: Discussed the use of AI scribe software for clinical note transcription with the patient, who gave verbal consent to proceed.  He presents with ongoing back pain. He has not sought further treatment for his back pain due to uncertainty about the status of his Medicaid coverage.  He continues to see his endocrinologist for management of his diabetes and he has an upcoming appointment in 1 week.   He reports difficulty eating and has been living off bread and water for the past couple of months. He attributes this to 'his mind setting him back into anorexia mode'. He has been gaining weight despite being on Trulicity.   He continues to take all his prescribed medications, including alfuzosin for BPH, atorvastatin and fenofibrate for cholesterol, losartan for blood pressure, and pantoprazole for acid reflux. He reports that he experiences painful acid reflux if he does not take the pantoprazole. He is also under the care of a psychiatrist and a therapist for his bipolar disorder.  He is scheduled for a colonoscopy as well.       Past Medical History:  Diagnosis Date   ADD (attention deficit disorder)    Allergy    Anxiety    Arthritis    right hip   Bipolar 1 disorder (HCC)    Blood in urine    CKD (chronic kidney disease), stage III (HCC)    Creatinine elevation    Dementia (HCC)    "early onset" (08/04/2017)   Depression    bipolar [facility]   Diabetes mellitus without complication (HCC)    Dizziness     Family history of anesthesia complication    pt is unsure , but pt father may have been difficult to arouse    Frequent falls    HCAP (healthcare-associated pneumonia) 10/31/2012   History of kidney stones    Hypertension    Hypogonadism male    Liver fatty degeneration    Lumbar radiculopathy    Microscopic hematuria    hereditary s/p Urology eval   Neuromuscular disorder (HCC)    feet neuropathy    Osteoarthritis of right hip 11/28/2011   2012 2015 s/p THR Severe  Dr Margreta Journey     Pleural effusion 11/02/2012   Pneumonia 10/2012   Pneumonia, organism unspecified(486) 11/02/2012   Polysubstance dependence, non-opioid, in remission (HCC)    remote   Primary osteoarthritis of left hip 05/22/2015   PTSD (post-traumatic stress disorder)    SOCIAL ANXIETY DISORDER    Schizoaffective disorder (HCC)    Substance abuse (HCC)    Suicide attempt by multiple drug overdose (HCC) 01/22/2016   Grieving his cat's death 04/24/16    Past Surgical History:  Procedure Laterality Date   BACK SURGERY     CLOSED REDUCTION METACARPAL WITH PERCUTANEOUS PINNING Right    LUMBAR DISC SURGERY     TONSILLECTOMY     TOTAL HIP ARTHROPLASTY Right 08/16/2013   Procedure: TOTAL HIP ARTHROPLASTY ANTERIOR APPROACH;  Surgeon: Velna Ochs, MD;  Location: [facility] OR;  Service:  Orthopedics;  Laterality: Right;   TOTAL HIP ARTHROPLASTY Left 05/22/2015   Procedure: TOTAL HIP ARTHROPLASTY ANTERIOR APPROACH;  Surgeon: Marcene Corning, MD;  Location: [facility] OR;  Service: Orthopedics;  Laterality: Left;    Family History  Problem Relation Age of Onset   Diabetes Father    Cancer Mother        died of melanoma with mets   Cervical cancer Sister    Diabetes Sister    Other Neg Hx        hypogonadism   Colon cancer Neg Hx    Colon polyps Neg Hx    Esophageal cancer Neg Hx    Rectal cancer Neg Hx    Stomach cancer Neg Hx     Social History   Socioeconomic History   Marital status: Single    Spouse name: Not on file    Number of children: 0   Years of education: 14   Highest education level: Some college, no degree  Occupational History   Occupation: disability  Tobacco Use   Smoking status: Former    Current packs/day: 0.00    Types: Cigarettes    Start date: 12/21/2018    Quit date: 03/22/2019    Years since quitting: 4.0   Smokeless tobacco: Never  Vaping Use   Vaping status: Never Used  Substance and Sexual Activity   Alcohol use: Not Currently   Drug use: No    Comment: hx of marijuana/cocaine/crack use but sober since 20's   Sexual activity: Not Currently  Other Topics Concern   Not on file  Social History Narrative   05/08/2021 lives alone, sister Selena Batten helps with meds, he has some in home care, lived with sister until Nov 2020   Caffeine- sodas, amount  varies   regular exercise-no   Social Determinants of Health   Financial Resource Strain: Medium Risk (03/25/2023)   Overall Financial Resource Strain (CARDIA)    Difficulty of Paying Living Expenses: Somewhat hard  Food Insecurity: Food Insecurity Present (03/25/2023)   Hunger Vital Sign    Worried About Running Out of Food in the Last Year: Sometimes true    Ran Out of Food in the Last Year: Never true  Transportation Needs: Unmet Transportation Needs (03/25/2023)   PRAPARE - Transportation    Lack of Transportation (Medical): Yes    Lack of Transportation (Non-Medical): Yes  Physical Activity: Inactive (03/25/2023)   Exercise Vital Sign    Days of Exercise per Week: 0 days    Minutes of Exercise per Session: 0 min  Stress: Stress Concern Present (03/25/2023)   [employer] of Occupational Health - Occupational Stress Questionnaire    Feeling of Stress : Very much  Social Connections: Socially Isolated (03/25/2023)   Social Connection and Isolation Panel [NHANES]    Frequency of Communication with Friends and Family: Once a week    Frequency of Social Gatherings with Friends and Family: Never    Attends Religious Services:  Never    Database administrator or Organizations: No    Attends Engineer, structural: Never    Marital Status: Never married    No Known Allergies  Outpatient Medications Prior to Visit  Medication Sig Dispense Refill   acetaminophen (TYLENOL) 500 MG tablet Take 1 tablet (500 mg total) by mouth every 8 (eight) hours as needed. 90 tablet 1   ARIPiprazole (ABILIFY) 10 MG tablet Take 1 tablet (10 mg total) by mouth daily. 30 tablet 0   buPROPion [facility]  XL) 150 MG 24 hr tablet Take 300 mg by mouth every morning.     Cholecalciferol (VITAMIN D-3) 125 MCG (5000 UT) TABS Take 125 mcg by mouth daily.     clonazePAM (KLONOPIN) 0.5 MG tablet Take by mouth.     Continuous Blood Gluc Receiver (FREESTYLE LIBRE READER) DEVI Use as directed 1 each each   Continuous Blood Gluc Sensor (FREESTYLE LIBRE 2 SENSOR) MISC by Does not apply route.     Dulaglutide (TRULICITY) 3 MG/0.5ML SOPN Inject 3 mg into the skin once a week. 6 mL 3   fenofibrate (TRICOR) 48 MG tablet TAKE 1 TABLET EVERY DAY 90 tablet 3   fluticasone (FLONASE) 50 MCG/ACT nasal spray USE 1 SPRAY IN EACH NOSTRIL EVERY DAY 32 g 2   glucose blood (ACCU-CHEK GUIDE) test strip CHECK SUGAR FOUR TIMES DAILY 100 each 12   HUMALOG KWIKPEN 200 UNIT/ML KwikPen INJECT 15-20 UNITS INTO THE SKIN 3 TIMES DAILY BEFORE MEALS. (DISCARD PEN 28 DAYS AFTER OPENING) 30 mL 3   insulin glargine (LANTUS SOLOSTAR) 100 UNIT/ML Solostar Pen Lantus 20 units before b'fast and 20 units at bedtime (Patient taking differently: 40 Units daily. Lantus 20 units before b'fast) 45 mL 1   Insulin Pen Needle (B-D UF III MINI PEN NEEDLES) 31G X 5 MM MISC USE 4 TIMES DAILY 120 each 12   magnesium oxide (MAG-OX) 400 (240 Mg) MG tablet Take 1 tablet (400 mg total) by mouth daily. 30 tablet 0   Multiple Vitamins-Minerals (MULTIVITAMIN WITH MINERALS) tablet Take 1 tablet by mouth daily.     polyvinyl alcohol (LIQUIFILM TEARS) 1.4 % ophthalmic solution Place 1 drop into both  eyes as needed for dry eyes. 15 mL 1   Testosterone 20.25 MG/1.25GM (1.62%) GEL Apply topically once a day to upper arms 112.5 g 1   traZODone (DESYREL) 100 MG tablet Take 200 mg by mouth at bedtime as needed.     alfuzosin (UROXATRAL) 10 MG 24 hr tablet Take 1 tablet (10 mg total) by mouth daily with breakfast. 90 tablet 1   atorvastatin (LIPITOR) 10 MG tablet TAKE 1 TABLET EVERY DAY 90 tablet 0   losartan (COZAAR) 25 MG tablet Take 25 mg by mouth daily.     pantoprazole (PROTONIX) 40 MG tablet Take 1 tablet (40 mg total) by mouth daily. 90 tablet 1   No facility-administered medications prior to visit.     ROS Review of Systems  Constitutional:  Negative for activity change and appetite change.  HENT:  Negative for sinus pressure and sore throat.   Respiratory:  Negative for chest tightness, shortness of breath and wheezing.   Cardiovascular:  Negative for chest pain and palpitations.  Gastrointestinal:  Negative for abdominal distention, abdominal pain and constipation.  Genitourinary: Negative.   Musculoskeletal:  Positive for back pain.  Psychiatric/Behavioral:  Negative for behavioral problems and dysphoric mood.     Objective:  BP 133/83   Pulse 85   Ht 6\' 4"  (1.93 m)   Wt 230 lb (104.3 kg)   SpO2 97%   BMI 28.00 kg/m      03/26/2023   11:26 AM 03/26/2023   10:46 AM 03/12/2023   12:40 PM  BP/Weight  Systolic BP 133 146   Diastolic BP 83 80   Wt. (Lbs)  230 225  BMI  28 kg/m2 27.39 kg/m2      Physical Exam Constitutional:      Appearance: He is well-developed.  Cardiovascular:     Rate  and Rhythm: Normal rate.     Heart sounds: Normal heart sounds. No murmur heard. Pulmonary:     Effort: Pulmonary effort is normal.     Breath sounds: Normal breath sounds. No wheezing or rales.  Chest:     Chest wall: No tenderness.  Abdominal:     General: Bowel sounds are normal. There is no distension.     Palpations: Abdomen is soft. There is no mass.      Tenderness: There is no abdominal tenderness.  Musculoskeletal:        General: Tenderness (lumbar spine) present. Normal range of motion.     Right lower leg: No edema.     Left lower leg: No edema.  Neurological:     Mental Status: He is alert and oriented to person, place, and time.  Psychiatric:        Mood and Affect: Mood normal.        Latest Ref Rng & Units 02/10/2022    3:52 PM 01/03/2022    5:57 AM 12/31/2021    6:07 AM  CMP  Glucose 70 - 99 mg/dL 161  096  045   BUN 6 - 24 mg/dL 16  17  18    Creatinine 0.76 - 1.27 mg/dL 4.09  8.11  9.14   Sodium 134 - 144 mmol/L 143  140  141   Potassium 3.5 - 5.2 mmol/L 5.2  3.6  4.1   Chloride 96 - 106 mmol/L 105  110  109   CO2 20 - 29 mmol/L 24  23  25    Calcium 8.7 - 10.2 mg/dL 78.2  9.2  9.4   Total Protein 6.0 - 8.5 g/dL 7.3     Total Bilirubin 0.0 - 1.2 mg/dL 0.3     Alkaline Phos 44 - 121 IU/L 73     AST 0 - 40 IU/L 20     ALT 0 - 44 IU/L 23       Lipid Panel     Component Value Date/Time   CHOL 139 07/16/2022 1144   CHOL 167 07/06/2020 1116   TRIG 285.0 (H) 07/16/2022 1144   HDL 43.20 07/16/2022 1144   HDL 39 (L) 07/06/2020 1116   CHOLHDL 3 07/16/2022 1144   VLDL 57.0 (H) 07/16/2022 1144   LDLCALC 76 07/06/2020 1116   LDLDIRECT 62.0 07/16/2022 1144    CBC    Component Value Date/Time   WBC 7.9 02/10/2022 1552   WBC 3.7 (L) 01/02/2022 0426   RBC 5.20 02/10/2022 1552   RBC 4.36 01/02/2022 0426   HGB 15.3 02/10/2022 1552   HCT 45.6 02/10/2022 1552   PLT 177 02/10/2022 1552   MCV 88 02/10/2022 1552   MCH 29.4 02/10/2022 1552   MCH 28.7 01/02/2022 0426   MCHC 33.6 02/10/2022 1552   MCHC 33.4 01/02/2022 0426   RDW 13.3 02/10/2022 1552   LYMPHSABS 1.3 12/15/2021 0659   LYMPHSABS 1.7 11/19/2021 1640   MONOABS 0.5 12/15/2021 0659   EOSABS 0.1 12/15/2021 0659   EOSABS 0.0 11/19/2021 1640   BASOSABS 0.0 12/15/2021 0659   BASOSABS 0.0 11/19/2021 1640    Lab Results  Component Value Date   HGBA1C 7.3 (A)  07/16/2022    Assessment & Plan:      Chronic Back Pain   Patient reports ongoing back pain. Uncertainty about Medicaid coverage is preventing him from seeking specialist care.   -Encourage patient to seek specialist care when possible.    Type 2 Diabetes  Controlled with last A1c of 7.3 Patient is under the care of an endocrinologist and is due for an appointment next Tuesday. Reports weight gain and decreased appetite, possibly related to Trulicity.   -Advise patient to discuss these issues with his endocrinologist.    Hypertension   Blood pressure slightly elevated at today's visit.   -Repeat blood pressure measurement before patient left return normal -Continue losartan  Benign Prostatic Hyperplasia   Patient is on Alfuzosin   -Continue Alfuzosin as prescribed.    Hyperlipidemia   Patient is on Atorvastatin and Fenofibrate.   -Continue Atorvastatin and Fenofibrate as prescribed.    Gastroesophageal Reflux Disease   Patient reports painful acid reflux if Pantoprazole is not taken.   -Continue Pantoprazole as prescribed.    Bipolar Disorder   Patient is under the care of a psychiatrist and therapist.   -Continue current psychiatric care.    General Health Maintenance   -Administer influenza vaccine today.   -Order labs to check kidney and liver function today.   -Plan for fasting labs to check cholesterol at a future date.   -Discuss shingles vaccine with patient; advise to get it from the pharmacy due to insurance restrictions.          Meds ordered this encounter  Medications   alfuzosin (UROXATRAL) 10 MG 24 hr tablet    Sig: Take 1 tablet (10 mg total) by mouth daily with breakfast.    Dispense:  90 tablet    Refill:  1   atorvastatin (LIPITOR) 10 MG tablet    Sig: Take 1 tablet (10 mg total) by mouth daily.    Dispense:  90 tablet    Refill:  1   losartan (COZAAR) 25 MG tablet    Sig: Take 1 tablet (25 mg total) by mouth daily.    Dispense:  90 tablet     Refill:  1   pantoprazole (PROTONIX) 40 MG tablet    Sig: Take 1 tablet (40 mg total) by mouth daily.    Dispense:  90 tablet    Refill:  1    Follow-up: Return in about 6 months (around 09/24/2023) for Chronic medical conditions.       Hoy Register, MD, FAAFP. [facility] [place], [place] [phone]   03/26/2023, 3:11 PM

## 2023-03-26 NOTE — Patient Instructions (Signed)
VISIT SUMMARY:  [name], during today's visit, we discussed your ongoing back pain, diabetes management, hypertension, benign prostatic hyperplasia, hyperlipidemia, gastroesophageal reflux disease, and bipolar disorder. We also reviewed your general health maintenance, including vaccinations and lab work.  YOUR PLAN:  -CHRONIC BACK PAIN: Chronic back pain is persistent pain in the back that can affect daily activities. You are encouraged to seek specialist care when possible, despite uncertainties about Medicaid coverage.  -DIABETES: Diabetes is a condition where blood sugar levels are too high. You are under the care of an endocrinologist and should discuss your weight gain and decreased appetite at your next appointment.  -HYPERTENSION: Hypertension is high blood pressure. Your blood pressure was slightly elevated today, so we will repeat the measurement before you leave.  -BENIGN PROSTATIC HYPERPLASIA (BPH): BPH is an enlarged prostate gland that can cause urinary issues. Continue taking Alfuzosin as prescribed by your urologist.  -HYPERLIPIDEMIA: Hyperlipidemia is high levels of fats in the blood. Continue taking Atorvastatin and Fenofibrate as prescribed.  -GASTROESOPHAGEAL REFLUX DISEASE (GERD): GERD is a condition where stomach acid frequently flows back into the esophagus, causing discomfort. Continue taking Pantoprazole as prescribed to manage your symptoms.  -BIPOLAR DISORDER: Bipolar disorder is a mental health condition with extreme mood swings. Continue your current psychiatric care with your psychiatrist and therapist.  -GENERAL HEALTH MAINTENANCE: We administered the influenza vaccine today. We also ordered labs to check your kidney and liver function, and we plan to check your cholesterol with fasting labs at a future date. Additionally, we discussed the shingles vaccine, which you should get from the pharmacy due to insurance restrictions.  INSTRUCTIONS:  Please follow up  with your endocrinologist next Tuesday to discuss your diabetes management. We will repeat your blood pressure measurement before you leave today. Make sure to get the shingles vaccine from the pharmacy as discussed.

## 2023-03-27 ENCOUNTER — Other Ambulatory Visit: Payer: Self-pay | Admitting: Family Medicine

## 2023-03-27 DIAGNOSIS — N179 Acute kidney failure, unspecified: Secondary | ICD-10-CM

## 2023-03-28 LAB — CMP14+EGFR
ALT: 26 [IU]/L (ref 0–44)
AST: 29 [IU]/L (ref 0–40)
Albumin: 4.7 g/dL (ref 3.8–4.9)
Alkaline Phosphatase: 87 [IU]/L (ref 44–121)
BUN/Creatinine Ratio: 11 (ref 9–20)
BUN: 19 mg/dL (ref 6–24)
Bilirubin Total: 0.3 mg/dL (ref 0.0–1.2)
CO2: 23 mmol/L (ref 20–29)
Calcium: 9.7 mg/dL (ref 8.7–10.2)
Chloride: 98 mmol/L (ref 96–106)
Creatinine, Ser: 1.76 mg/dL — ABNORMAL HIGH (ref 0.76–1.27)
Globulin, Total: 2.9 g/dL (ref 1.5–4.5)
Glucose: 340 mg/dL — ABNORMAL HIGH (ref 70–99)
Potassium: 4.7 mmol/L (ref 3.5–5.2)
Sodium: 138 mmol/L (ref 134–144)
Total Protein: 7.6 g/dL (ref 6.0–8.5)
eGFR: 45 mL/min/{1.73_m2} — ABNORMAL LOW (ref >59)

## 2023-03-28 LAB — MICROALBUMIN / CREATININE URINE RATIO
Creatinine, Urine: 69.7 mg/dL
Microalb/Creat Ratio: 24 mg/g{creat} (ref 0–29)
Microalbumin, Urine: 16.5 ug/mL

## 2023-03-30 ENCOUNTER — Encounter: Payer: Self-pay | Admitting: Gastroenterology

## 2023-03-30 ENCOUNTER — Ambulatory Visit: Payer: Medicare HMO | Admitting: Gastroenterology

## 2023-03-30 VITALS — BP 101/73 | HR 75 | Temp 100.0°F | Resp 14 | Ht 76.0 in | Wt 230.0 lb

## 2023-03-30 DIAGNOSIS — D123 Benign neoplasm of transverse colon: Secondary | ICD-10-CM

## 2023-03-30 DIAGNOSIS — D128 Benign neoplasm of rectum: Secondary | ICD-10-CM | POA: Diagnosis not present

## 2023-03-30 DIAGNOSIS — Z860101 Personal history of adenomatous and serrated colon polyps: Secondary | ICD-10-CM | POA: Diagnosis not present

## 2023-03-30 DIAGNOSIS — D125 Benign neoplasm of sigmoid colon: Secondary | ICD-10-CM | POA: Diagnosis not present

## 2023-03-30 DIAGNOSIS — K635 Polyp of colon: Secondary | ICD-10-CM

## 2023-03-30 DIAGNOSIS — F0390 Unspecified dementia without behavioral disturbance: Secondary | ICD-10-CM | POA: Diagnosis not present

## 2023-03-30 DIAGNOSIS — Z1211 Encounter for screening for malignant neoplasm of colon: Secondary | ICD-10-CM

## 2023-03-30 DIAGNOSIS — K648 Other hemorrhoids: Secondary | ICD-10-CM | POA: Diagnosis not present

## 2023-03-30 DIAGNOSIS — F259 Schizoaffective disorder, unspecified: Secondary | ICD-10-CM | POA: Diagnosis not present

## 2023-03-30 DIAGNOSIS — E119 Type 2 diabetes mellitus without complications: Secondary | ICD-10-CM | POA: Diagnosis not present

## 2023-03-30 MED ORDER — SODIUM CHLORIDE 0.9 % IV SOLN: 500.0000 mL | Freq: Once | INTRAVENOUS | Status: DC

## 2023-03-30 NOTE — Patient Instructions (Signed)
You may resume all of your previous medications today as ordered.  Read your discharge instructions.  YOU HAD AN ENDOSCOPIC PROCEDURE TODAY AT THE [facility]:   Refer to the procedure report that was given to you for any specific questions about what was found during the examination.  If the procedure report does not answer your questions, please call your gastroenterologist to clarify.  If you requested that your care partner not be given the details of your procedure findings, then the procedure report has been included in a sealed envelope for you to review at your convenience later.  YOU SHOULD EXPECT: Some feelings of bloating in the abdomen. Passage of more gas than usual.  Walking can help get rid of the air that was put into your GI tract during the procedure and reduce the bloating. If you had a lower endoscopy (such as a colonoscopy or flexible sigmoidoscopy) you may notice spotting of blood in your stool or on the toilet paper. If you underwent a bowel prep for your procedure, you may not have a normal bowel movement for a few days.  Please Note:  You might notice some irritation and congestion in your nose or some drainage.  This is from the oxygen used during your procedure.  There is no need for concern and it should clear up in a day or so.  SYMPTOMS TO REPORT IMMEDIATELY:  Following lower endoscopy (colonoscopy or flexible sigmoidoscopy):  Excessive amounts of blood in the stool  Significant tenderness or worsening of abdominal pains  Swelling of the abdomen that is new, acute  Fever of 100F or higher   For urgent or emergent issues, a gastroenterologist can be reached at any hour by calling [phone]. Do not use MyChart messaging for urgent concerns.    DIET:  We do recommend a small meal at first, but then you may proceed to your regular diet.  Drink plenty of fluids but you should avoid alcoholic beverages for 24 hours.  ACTIVITY:  You should plan to take  it easy for the rest of today and you should NOT DRIVE or use heavy machinery until tomorrow (because of the sedation medicines used during the test).    FOLLOW UP: Our staff will call the number listed on your records the next business day following your procedure.  We will call around 7:15- 8:00 am to check on you and address any questions or concerns that you may have regarding the information given to you following your procedure. If we do not reach you, we will leave a message.     If any biopsies were taken you will be contacted by phone or by letter within the next 1-3 weeks.  Please call us at [phone] if you have not heard about the biopsies in 3 weeks.    SIGNATURES/CONFIDENTIALITY: You and/or your care partner have signed paperwork which will be entered into your electronic medical record.  These signatures attest to the fact that that the information above on your After Visit Summary has been reviewed and is understood.  Full responsibility of the confidentiality of this discharge information lies with you and/or your care-partner.

## 2023-03-30 NOTE — Progress Notes (Signed)
Called to room to assist during endoscopic procedure.  Patient ID and intended procedure confirmed with present staff. Received instructions for my participation in the procedure from the performing physician.  

## 2023-03-30 NOTE — Op Note (Signed)
[facility] Patient Name: [name] Procedure Date: 03/30/2023 3:16 PM MRN: [id number] Endoscopist: Meryl Dare , MD, [id number] Age: 57 Referring MD:  Date of Birth: 07/22/1965 Gender: Male Account #: [id number] Procedure:                Colonoscopy Indications:              Surveillance: Personal history of adenomatous                            polyps on last colonoscopy 3 years ago Medicines:                Monitored Anesthesia Care Procedure:                Pre-Anesthesia Assessment:                           - Prior to the procedure, a History and Physical                            was performed, and patient medications and                            allergies were reviewed. The patient's tolerance of                            previous anesthesia was also reviewed. The risks                            and benefits of the procedure and the sedation                            options and risks were discussed with the patient.                            All questions were answered, and informed consent                            was obtained. Prior Anticoagulants: The patient has                            taken no anticoagulant or antiplatelet agents. ASA                            Grade Assessment: III - A patient with severe                            systemic disease. After reviewing the risks and                            benefits, the patient was deemed in satisfactory                            condition to undergo the procedure.  After obtaining informed consent, the colonoscope                            was passed under direct vision. Throughout the                            procedure, the patient's blood pressure, pulse, and                            oxygen saturations were monitored continuously. The                            CF [id number] #[id number] was introduced through the anus                            and advanced to the  the cecum, identified by                            appendiceal orifice and ileocecal valve. The                            ileocecal valve, appendiceal orifice, and rectum                            were photographed. The quality of the bowel                            preparation was good. The colonoscopy was performed                            without difficulty. The patient tolerated the                            procedure well. Scope In: 3:21:44 PM Scope Out: 3:42:58 PM Scope Withdrawal Time: 0 hours 14 minutes 59 seconds  Total Procedure Duration: 0 hours 21 minutes 14 seconds  Findings:                 The perianal and digital rectal examinations were                            normal.                           Three sessile polyps were found in the sigmoid                            colon and transverse colon. The polyps were 6 to 7                            mm in size. These polyps were removed with a cold                            snare. Resection and retrieval were complete.  A 4 mm polyp was found in the rectum. The polyp was                            sessile. The polyp was removed with a cold biopsy                            forceps. Resection and retrieval were complete.                           A few medium-mouthed diverticula were found in the                            right colon.                           Internal hemorrhoids were found during                            retroflexion. The hemorrhoids were small and Grade                            I (internal hemorrhoids that do not prolapse).                           The exam was otherwise without abnormality on                            direct and retroflexion views. Complications:            No immediate complications. Estimated blood loss:                            None. Estimated Blood Loss:     Estimated blood loss: none. Impression:               - Three 6 to 7 mm polyps in the  sigmoid colon and                            in the transverse colon, removed with a cold snare.                            Resected and retrieved.                           - One 4 mm polyp in the rectum, removed with a cold                            biopsy forceps. Resected and retrieved.                           - Internal hemorrhoids.                           - The examination was otherwise normal on direct  and retroflexion views. Recommendation:           - Repeat colonoscopy after studies are complete for                            surveillance based on pathology results.                           - Patient has a contact number available for                            emergencies. The signs and symptoms of potential                            delayed complications were discussed with the                            patient. Return to normal activities tomorrow.                            Written discharge instructions were provided to the                            patient.                           - Resume previous diet adding high fiber.                           - Continue present medications.                           - Await pathology results. Meryl Dare, MD 03/30/2023 3:47:24 PM This report has been signed electronically.

## 2023-03-30 NOTE — Progress Notes (Signed)
See 03/26/2023 H&P no changes

## 2023-03-30 NOTE — Progress Notes (Signed)
Pt's states no medical or surgical changes since previsit or office visit.  Pt temp is 100, pt asymptomatic otherwise, Dr Russella Dar notified.

## 2023-03-30 NOTE — Progress Notes (Signed)
Report to PACU, RN, vss, BBS= Clear.  

## 2023-03-31 ENCOUNTER — Ambulatory Visit: Payer: Medicare HMO | Admitting: Internal Medicine

## 2023-03-31 ENCOUNTER — Telehealth: Payer: Self-pay

## 2023-03-31 NOTE — Telephone Encounter (Signed)
Follow up call placed, no answer and no VM. 

## 2023-04-02 LAB — SURGICAL PATHOLOGY

## 2023-04-03 ENCOUNTER — Ambulatory Visit (INDEPENDENT_AMBULATORY_CARE_PROVIDER_SITE_OTHER): Payer: Medicare HMO | Admitting: Internal Medicine

## 2023-04-03 ENCOUNTER — Telehealth: Payer: Self-pay

## 2023-04-03 ENCOUNTER — Encounter: Payer: Self-pay | Admitting: Internal Medicine

## 2023-04-03 VITALS — BP 146/80 | HR 89 | Ht 76.0 in | Wt 228.2 lb

## 2023-04-03 DIAGNOSIS — N184 Chronic kidney disease, stage 4 (severe): Secondary | ICD-10-CM

## 2023-04-03 DIAGNOSIS — Z794 Long term (current) use of insulin: Secondary | ICD-10-CM | POA: Diagnosis not present

## 2023-04-03 DIAGNOSIS — E1122 Type 2 diabetes mellitus with diabetic chronic kidney disease: Secondary | ICD-10-CM | POA: Diagnosis not present

## 2023-04-03 DIAGNOSIS — E785 Hyperlipidemia, unspecified: Secondary | ICD-10-CM | POA: Diagnosis not present

## 2023-04-03 DIAGNOSIS — R6889 Other general symptoms and signs: Secondary | ICD-10-CM | POA: Diagnosis not present

## 2023-04-03 LAB — POCT GLYCOSYLATED HEMOGLOBIN (HGB A1C): Hemoglobin A1C: 7.9 % — AB (ref 4.0–5.6)

## 2023-04-03 NOTE — Patient Instructions (Addendum)
Please use the following regimen: - Lantus 40 units in am  Increase: - Trulicity 3 mg weekly  Please use: - Humalog U200:  15 min before meals  - 20 units before protein shake in am - 15-20 units before dinner  Please come back for a follow-up appointment in 3-4 months.

## 2023-04-03 NOTE — Telephone Encounter (Signed)
Sample  Device/Supplies: FreeStyle Libre Quantity:1 LOT: [id number] EXP:08/19/23  Dicie Beam    Provided to patient in office

## 2023-04-03 NOTE — Progress Notes (Signed)
Patient ID: Jonathan Shurts., male   DOB: [birth year], 57 y.o.   MRN: [id number]  HPI: [name]. is a 57 y.o.-year-old male, returning for follow-up for DM2, dx in 2012, insulin-dependent since 2017, uncontrolled, with complications (stage 4 CKD, PN, fatty liver). Last OV 4 mo ago.  Interim hx: He has no increased urination, no blurry vision, nausea, chest pain.   Still has some back pain and uses a cane or a walker. He recently had a colonoscopy >> polyps.  Reviewed HbA1c: Lab Results  Component Value Date   HGBA1C 7.0 12/05/2022   HGBA1C 7.3 (A) 07/16/2022   HGBA1C 8.7 (A) 04/10/2022   HGBA1C 8.0 (H) 12/02/2021   HGBA1C 9.4 (A) 09/02/2021   HGBA1C 7.8 (A) 06/11/2021   HGBA1C 7.1 (H) 05/27/2021   HGBA1C 8.1 (A) 11/14/2020   HGBA1C 8.0 (A) 08/30/2020   HGBA1C 7.5 (H) 07/06/2020   Pt was on a regimen of: - NPH 45 units in a.m. Prev. On Lantus,  Levemir, NovoLog, Humalog, Tradjenta,Metformin.  He is now on: - Lantus 20 units before b'fast and 20 units at bedtime >> 40 units in am as he was forgetting to bedtime dose - Humalog U200:  - 15  >> using 20 units AFTER  protein shake in am >> 15 minutes before - 18-20 >> units 15-20 units AFTER  dinner >> 15 minutes before - Trulicity 1.5 >> 3 mg weekly  Pt checks his sugars >4x a day with the Libre CGM - from [facility]:  Previously:  Previously:   Lowest sugar was  65 >> 60s >> 59; he has hypoglycemia awareness at 70.  Highest sugar was HI >> upper 200s >> 200s >> HI  Glucometer: Accu-Chek guide  Meals: - Protein shake (20 units) - Lunch  - skips - Dinner - potato cheddar bake (15 units)  - + CKD-sees nephrology, last BUN/creatinine:  Lab Results  Component Value Date   BUN 19 03/26/2023   BUN 16 02/10/2022   CREATININE 1.76 (H) 03/26/2023   CREATININE 1.21 02/10/2022  10/13/2022: 15/1.62, GFR 50, glucose 201 On lisinopril.  - + HL; last set of lipids: Lab Results  Component Value Date   CHOL  139 07/16/2022   HDL 43.20 07/16/2022   LDLCALC 76 07/06/2020   LDLDIRECT 62.0 07/16/2022   TRIG 285.0 (H) 07/16/2022   CHOLHDL 3 07/16/2022  On Lipitor 10 mg daily Tricor 48 mg daily. He could not take fish oil 2/2 large capsule.  - last eye exam was in  09/2022. No DR reportedly.   - no numbness and tingling in his feet.  On Neurontin 300 mg twice a day.  Last foot exam by Dr. Burna Mortimer 12/16/2022.  He also has a history of hypogonadism, HTN, back pain, bipolar disorder, PTSD, history of suicide attempt. He undergoes ECT treatments. He has a history of schizoaffective disorder and also poor memory.  He has an aide that helps him.  He gives his own insulin. He was admitted 11/2021 for pneumonia.  In 2023, he had a thyroid ultrasound showing 3 nodules, of which do need to be followed with annual ultrasounds.  This is managed by PCP.  ROS: + see HPI  Past Medical History:  Diagnosis Date   ADD (attention deficit disorder)    Allergy    Anxiety    Arthritis    right hip   Bipolar 1 disorder (HCC)    Blood in urine    CKD (chronic kidney  disease), stage III (HCC)    Creatinine elevation    Dementia (HCC)    "early onset" (08/04/2017)   Depression    bipolar [facility]   Diabetes mellitus without complication (HCC)    Dizziness    Family history of anesthesia complication    pt is unsure , but pt father may have been difficult to arouse    Frequent falls    HCAP (healthcare-associated pneumonia) 10/31/2012   History of kidney stones    Hypertension    Hypogonadism male    Liver fatty degeneration    Lumbar radiculopathy    Microscopic hematuria    hereditary s/p Urology eval   Neuromuscular disorder (HCC)    feet neuropathy    Osteoarthritis of right hip 11/28/2011   2012 2015 s/p THR Severe  Dr Margreta Journey     Pleural effusion 11/02/2012   Pneumonia 10/2012   Pneumonia, organism unspecified(486) 11/02/2012   Polysubstance dependence, non-opioid, in remission (HCC)     remote   Primary osteoarthritis of left hip 05/22/2015   PTSD (post-traumatic stress disorder)    SOCIAL ANXIETY DISORDER    Schizoaffective disorder (HCC)    Substance abuse (HCC)    Suicide attempt by multiple drug overdose (HCC) 01-16-16   Grieving his cat's death May 03, 2016   Past Surgical History:  Procedure Laterality Date   BACK SURGERY     CLOSED REDUCTION METACARPAL WITH PERCUTANEOUS PINNING Right    COLONOSCOPY  03/21/2020   LUMBAR DISC SURGERY     TONSILLECTOMY     TOTAL HIP ARTHROPLASTY Right 08/16/2013   Procedure: TOTAL HIP ARTHROPLASTY ANTERIOR APPROACH;  Surgeon: Velna Ochs, MD;  Location: [facility] OR;  Service: Orthopedics;  Laterality: Right;   TOTAL HIP ARTHROPLASTY Left 05/22/2015   Procedure: TOTAL HIP ARTHROPLASTY ANTERIOR APPROACH;  Surgeon: Marcene Corning, MD;  Location: [facility] OR;  Service: Orthopedics;  Laterality: Left;   Social History   Socioeconomic History   Marital status: Single    Spouse name: Not on file   Number of children: 0   Years of education: 14   Highest education level: Some college, no degree  Occupational History   Occupation: disability  Tobacco Use   Smoking status: Former    Current packs/day: 0.00    Types: Cigarettes    Start date: 12/21/2018    Quit date: 03/22/2019    Years since quitting: 4.0   Smokeless tobacco: Never  Vaping Use   Vaping status: Never Used  Substance and Sexual Activity   Alcohol use: Not Currently   Drug use: Not Currently    Types: Marijuana, "Crack" cocaine, Cocaine    Comment: hx of marijuana/cocaine/crack use but sober since May 04, 2023   Sexual activity: Not Currently  Other Topics Concern   Not on file  Social History Narrative   05/08/2021 lives alone, sister Selena Batten helps with meds, he has some in home care, lived with sister until Nov 2020   Caffeine- sodas, amount  varies   regular exercise-no   Social Drivers of Health   Financial Resource Strain: Medium Risk (03/25/2023)   Overall Financial  Resource Strain (CARDIA)    Difficulty of Paying Living Expenses: Somewhat hard  Food Insecurity: Food Insecurity Present (03/25/2023)   Hunger Vital Sign    Worried About Running Out of Food in the Last Year: Sometimes true    Ran Out of Food in the Last Year: Never true  Transportation Needs: Unmet Transportation Needs (03/25/2023)   PRAPARE - Transportation  Lack of Transportation (Medical): Yes    Lack of Transportation (Non-Medical): Yes  Physical Activity: Inactive (03/25/2023)   Exercise Vital Sign    Days of Exercise per Week: 0 days    Minutes of Exercise per Session: 0 min  Stress: Stress Concern Present (03/25/2023)   [employer] of Occupational Health - Occupational Stress Questionnaire    Feeling of Stress : Very much  Social Connections: Socially Isolated (03/25/2023)   Social Connection and Isolation Panel [NHANES]    Frequency of Communication with Friends and Family: Once a week    Frequency of Social Gatherings with Friends and Family: Never    Attends Religious Services: Never    Database administrator or Organizations: No    Attends Banker Meetings: Never    Marital Status: Never married  Intimate Partner Violence: Not At Risk (10/29/2022)   Humiliation, Afraid, Rape, and Kick questionnaire    Fear of Current or Ex-Partner: No    Emotionally Abused: No    Physically Abused: No    Sexually Abused: No   Current Outpatient Medications on File Prior to Visit  Medication Sig Dispense Refill   acetaminophen (TYLENOL) 500 MG tablet Take 1 tablet (500 mg total) by mouth every 8 (eight) hours as needed. 90 tablet 1   alfuzosin (UROXATRAL) 10 MG 24 hr tablet Take 1 tablet (10 mg total) by mouth daily with breakfast. 90 tablet 1   ARIPiprazole (ABILIFY) 10 MG tablet Take 1 tablet (10 mg total) by mouth daily. 30 tablet 0   atorvastatin (LIPITOR) 10 MG tablet Take 1 tablet (10 mg total) by mouth daily. 90 tablet 1   buPROPion (WELLBUTRIN XL) 300 MG 24 hr  tablet SMARTSIG:1.0 Tablet(s) By Mouth Daily     clonazePAM (KLONOPIN) 0.5 MG tablet Take by mouth.     Continuous Blood Gluc Receiver (FREESTYLE LIBRE READER) DEVI Use as directed 1 each each   Dulaglutide (TRULICITY) 3 MG/0.5ML SOPN Inject 3 mg into the skin once a week. 6 mL 3   fenofibrate (TRICOR) 48 MG tablet TAKE 1 TABLET EVERY DAY 90 tablet 3   fluticasone (FLONASE) 50 MCG/ACT nasal spray USE 1 SPRAY IN EACH NOSTRIL EVERY DAY 32 g 2   glucose blood (ACCU-CHEK GUIDE) test strip CHECK SUGAR FOUR TIMES DAILY 100 each 12   HUMALOG KWIKPEN 200 UNIT/ML KwikPen INJECT 15-20 UNITS INTO THE SKIN 3 TIMES DAILY BEFORE MEALS. (DISCARD PEN 28 DAYS AFTER OPENING) 30 mL 3   insulin glargine (LANTUS SOLOSTAR) 100 UNIT/ML Solostar Pen Lantus 20 units before b'fast and 20 units at bedtime (Patient taking differently: 40 Units daily. Lantus 20 units before b'fast) 45 mL 1   Insulin Pen Needle (B-D UF III MINI PEN NEEDLES) 31G X 5 MM MISC USE 4 TIMES DAILY 120 each 12   losartan (COZAAR) 25 MG tablet Take 1 tablet (25 mg total) by mouth daily. 90 tablet 1   Multiple Vitamins-Minerals (MULTIVITAMIN WITH MINERALS) tablet Take 1 tablet by mouth daily.     pantoprazole (PROTONIX) 40 MG tablet Take 1 tablet (40 mg total) by mouth daily. 90 tablet 1   traZODone (DESYREL) 100 MG tablet Take 200 mg by mouth at bedtime as needed.     No current facility-administered medications on file prior to visit.   No Known Allergies  Family History  Problem Relation Age of Onset   Diabetes Father    Cancer Mother        died of melanoma with  mets   Cervical cancer Sister    Diabetes Sister    Other Neg Hx        hypogonadism   Colon cancer Neg Hx    Colon polyps Neg Hx    Esophageal cancer Neg Hx    Rectal cancer Neg Hx    Stomach cancer Neg Hx    PE: BP (!) 146/80   Pulse 89   Ht 6\' 4"  (1.93 m)   Wt 228 lb 3.2 oz (103.5 kg)   SpO2 97%   BMI 27.78 kg/m  Wt Readings from Last 3 Encounters:  04/03/23 228 lb  3.2 oz (103.5 kg)  03/30/23 230 lb (104.3 kg)  03/26/23 230 lb (104.3 kg)   Constitutional: overweight, in NAD, walks with a cane Eyes:  EOMI, no exophthalmos ENT: no neck masses, no cervical lymphadenopathy Cardiovascular: RRR, No MRG Respiratory: CTA B Musculoskeletal: no deformities Skin:no rashes Neurological: no tremor with outstretched hands  ASSESSMENT: 1. DM2, insulin-dependent, uncontrolled, with complications - CKD - PN - fatty liver  2. HL  PLAN:  1. Patient with longstanding, uncontrolled, type 2 diabetes, basal-bolus insulin regimen and also weekly GLP-1 receptor agonist, which we increased at last visit.  HbA1c at that time was lower, at 7.0%, decreased from 7.3%.  Sugars appear to be better with still some hyperglycemic spikes especially after morning protein shake and less so after dinner but these were followed by a decrease in blood sugars sometimes to the 70s.  Upon questioning, he was injecting insulin after the meals and I advised him to move the Humalog boluses 15 minutes before meals.  We increased the Trulicity dose per his request, to also help with weight loss. CGM interpretation: -At today's visit, we reviewed his CGM downloads: It appears that 48% of values are in target range (goal >70%), while 52% are higher than 180 (goal <25%), and 0% are lower than 70 (goal <4%).  The calculated average blood sugar is 184.  The projected HbA1c for the next 3 months (GMI) is approximately 8% -Reviewing the CGM trends, sugars appear to be fluctuating around the upper limit of the target range but with significant increase in blood sugars after his breakfast, which is a protein shake.  Upon questioning, he did not start to take his insulin 15 minutes before the meal, as advised, but he takes it after the meal.  I again advised him that without taking insulin in advance, we will be able to bring his blood sugars under control after meals.  Moreover, he is taking a lower dose of  Trulicity, 1.5 mg weekly and we discussed about increasing this, as advised at last visit.  Will continue the rest of the regimen. -He is currently using the freestyle libre 2.  At today's visit we gave him a sample of the freestyle libre 3+.  We also will send a prescription for this to [facility]. - I suggested to:  Patient Instructions  Please use the following regimen: - Lantus 40 units in am  Increase: - Trulicity 3 mg weekly  Please use: - Humalog U200:  15 min before meals  - 20 units before protein shake in am - 15-20 units before dinner  Please come back for a follow-up appointment in 3-4 months.   - we checked his HbA1c: 7.9% (higher) - advised to check sugars at different times of the day - 4x a day, rotating check times - advised for yearly eye exams >> he is UTD - return  to clinic in 3-4 months  2. HL -R reviewed latest lipid panel from 06/2022: Fractions at goal with the exception of elevated triglycerides: Lab Results  Component Value Date   CHOL 139 07/16/2022   HDL 43.20 07/16/2022   LDLCALC 76 07/06/2020   LDLDIRECT 62.0 07/16/2022   TRIG 285.0 (H) 07/16/2022   CHOLHDL 3 07/16/2022  -Continues Lipitor 10 mg daily without side effects.  I previously also suggested fish oil 1000 mg twice a day, but he found the capsules to large.  He is on Tricor 48 mg daily, started by PCP.  Carlus Pavlov, MD PhD [facility] Endocrinology

## 2023-04-07 DIAGNOSIS — R6889 Other general symptoms and signs: Secondary | ICD-10-CM | POA: Diagnosis not present

## 2023-04-07 DIAGNOSIS — F4010 Social phobia, unspecified: Secondary | ICD-10-CM | POA: Diagnosis not present

## 2023-04-13 ENCOUNTER — Encounter: Payer: Self-pay | Admitting: Gastroenterology

## 2023-04-13 DIAGNOSIS — F4010 Social phobia, unspecified: Secondary | ICD-10-CM | POA: Diagnosis not present

## 2023-04-17 DIAGNOSIS — N1832 Chronic kidney disease, stage 3b: Secondary | ICD-10-CM | POA: Diagnosis not present

## 2023-04-17 DIAGNOSIS — E1122 Type 2 diabetes mellitus with diabetic chronic kidney disease: Secondary | ICD-10-CM | POA: Diagnosis not present

## 2023-04-17 DIAGNOSIS — D631 Anemia in chronic kidney disease: Secondary | ICD-10-CM | POA: Diagnosis not present

## 2023-04-17 DIAGNOSIS — F319 Bipolar disorder, unspecified: Secondary | ICD-10-CM | POA: Diagnosis not present

## 2023-04-17 DIAGNOSIS — R6889 Other general symptoms and signs: Secondary | ICD-10-CM | POA: Diagnosis not present

## 2023-04-17 DIAGNOSIS — I129 Hypertensive chronic kidney disease with stage 1 through stage 4 chronic kidney disease, or unspecified chronic kidney disease: Secondary | ICD-10-CM | POA: Diagnosis not present

## 2023-04-21 DIAGNOSIS — F4010 Social phobia, unspecified: Secondary | ICD-10-CM | POA: Diagnosis not present

## 2023-04-22 ENCOUNTER — Other Ambulatory Visit: Payer: Self-pay | Admitting: Internal Medicine

## 2023-04-22 ENCOUNTER — Other Ambulatory Visit: Payer: Self-pay | Admitting: Family Medicine

## 2023-04-22 DIAGNOSIS — Z794 Long term (current) use of insulin: Secondary | ICD-10-CM

## 2023-04-22 DIAGNOSIS — E1122 Type 2 diabetes mellitus with diabetic chronic kidney disease: Secondary | ICD-10-CM

## 2023-05-01 ENCOUNTER — Other Ambulatory Visit: Payer: Medicare HMO

## 2023-05-05 DIAGNOSIS — F4010 Social phobia, unspecified: Secondary | ICD-10-CM | POA: Diagnosis not present

## 2023-05-08 ENCOUNTER — Other Ambulatory Visit: Payer: Medicare HMO

## 2023-05-12 ENCOUNTER — Encounter: Payer: Self-pay | Admitting: Internal Medicine

## 2023-05-19 DIAGNOSIS — F4010 Social phobia, unspecified: Secondary | ICD-10-CM | POA: Diagnosis not present

## 2023-06-02 DIAGNOSIS — F332 Major depressive disorder, recurrent severe without psychotic features: Secondary | ICD-10-CM | POA: Diagnosis not present

## 2023-06-02 DIAGNOSIS — F4310 Post-traumatic stress disorder, unspecified: Secondary | ICD-10-CM | POA: Diagnosis not present

## 2023-06-02 DIAGNOSIS — F4010 Social phobia, unspecified: Secondary | ICD-10-CM | POA: Diagnosis not present

## 2023-06-05 DIAGNOSIS — E1122 Type 2 diabetes mellitus with diabetic chronic kidney disease: Secondary | ICD-10-CM | POA: Diagnosis not present

## 2023-06-16 DIAGNOSIS — F4010 Social phobia, unspecified: Secondary | ICD-10-CM | POA: Diagnosis not present

## 2023-06-17 ENCOUNTER — Ambulatory Visit (INDEPENDENT_AMBULATORY_CARE_PROVIDER_SITE_OTHER): Payer: Medicare HMO | Admitting: Podiatry

## 2023-06-17 ENCOUNTER — Encounter: Payer: Self-pay | Admitting: Podiatry

## 2023-06-17 DIAGNOSIS — B351 Tinea unguium: Secondary | ICD-10-CM

## 2023-06-17 DIAGNOSIS — M79675 Pain in left toe(s): Secondary | ICD-10-CM

## 2023-06-17 DIAGNOSIS — M79674 Pain in right toe(s): Secondary | ICD-10-CM

## 2023-06-17 DIAGNOSIS — E1142 Type 2 diabetes mellitus with diabetic polyneuropathy: Secondary | ICD-10-CM

## 2023-06-17 NOTE — Progress Notes (Signed)
  Subjective:  Patient ID: Jonathan Clark., male    DOB: 1966/03/26,   MRN: [id number]  No chief complaint on file.   58 y.o. male presents for concern of thickened elongated and painful nails that are difficult to trim. Requesting to have them trimmed today. Relates burning and tingling in their feet. Patient is diabetic and last A1c was  Lab Results  Component Value Date   HGBA1C 7.9 (A) 04/03/2023   .   PCP:  Hoy Register, MD    . Denies any other pedal complaints. Denies n/v/f/c.   Past Medical History:  Diagnosis Date   ADD (attention deficit disorder)    Allergy    Anxiety    Arthritis    right hip   Bipolar 1 disorder (HCC)    Blood in urine    CKD (chronic kidney disease), stage III (HCC)    Creatinine elevation    Dementia (HCC)    "early onset" (08/04/2017)   Depression    bipolar [facility]   Diabetes mellitus without complication (HCC)    Dizziness    Family history of anesthesia complication    pt is unsure , but pt father may have been difficult to arouse    Frequent falls    HCAP (healthcare-associated pneumonia) 10/31/2012   History of kidney stones    Hypertension    Hypogonadism male    Liver fatty degeneration    Lumbar radiculopathy    Microscopic hematuria    hereditary s/p Urology eval   Neuromuscular disorder (HCC)    feet neuropathy    Osteoarthritis of right hip 11/28/2011   2012 2015 s/p THR Severe  Dr Margreta Journey     Pleural effusion 11/02/2012   Pneumonia 10/2012   Pneumonia, organism unspecified(486) 11/02/2012   Polysubstance dependence, non-opioid, in remission (HCC)    remote   Primary osteoarthritis of left hip 05/22/2015   PTSD (post-traumatic stress disorder)    SOCIAL ANXIETY DISORDER    Schizoaffective disorder (HCC)    Substance abuse (HCC)    Suicide attempt by multiple drug overdose (HCC) 05-Jan-2016   Grieving his cat's death 07/02/15    Objective:  Physical Exam: Vascular: DP/PT pulses 2/4 bilateral. CFT <3  seconds. Absent hair growth on digits. Edema noted to bilateral lower extremities. Xerosis noted bilaterally.  Skin. No lacerations or abrasions bilateral feet. Nails 1-5 bilateral  are thickened discolored and elongated with subungual debris.  Musculoskeletal: MMT 5/5 bilateral lower extremities in DF, PF, Inversion and Eversion. Deceased ROM in DF of ankle joint.  Neurological: Sensation intact to light touch. Protective sensation diminished bilateral.    Assessment:  No diagnosis found.   Plan:  Patient was evaluated and treated and all questions answered. -Discussed and educated patient on diabetic foot care, especially with  regards to the vascular, neurological and musculoskeletal systems.  -Stressed the importance of good glycemic control and the detriment of not  controlling glucose levels in relation to the foot. -Discussed supportive shoes at all times and checking feet regularly.  -Mechanically debrided all nails 1-5 bilateral using sterile nail nipper and filed with dremel without incident  -Answered all patient questions -Patient to return  in 3 months for at risk foot care -Patient advised to call the office if any problems or questions arise in the meantime.   Louann Sjogren, DPM

## 2023-06-30 DIAGNOSIS — F4010 Social phobia, unspecified: Secondary | ICD-10-CM | POA: Diagnosis not present

## 2023-07-05 DIAGNOSIS — E1122 Type 2 diabetes mellitus with diabetic chronic kidney disease: Secondary | ICD-10-CM | POA: Diagnosis not present

## 2023-07-13 DIAGNOSIS — M47816 Spondylosis without myelopathy or radiculopathy, lumbar region: Secondary | ICD-10-CM | POA: Diagnosis not present

## 2023-07-13 DIAGNOSIS — Z6826 Body mass index (BMI) 26.0-26.9, adult: Secondary | ICD-10-CM | POA: Diagnosis not present

## 2023-07-14 DIAGNOSIS — F4010 Social phobia, unspecified: Secondary | ICD-10-CM | POA: Diagnosis not present

## 2023-07-23 DIAGNOSIS — M47816 Spondylosis without myelopathy or radiculopathy, lumbar region: Secondary | ICD-10-CM | POA: Diagnosis not present

## 2023-07-28 ENCOUNTER — Encounter: Payer: Self-pay | Admitting: Family Medicine

## 2023-07-28 ENCOUNTER — Ambulatory Visit: Payer: Medicare HMO | Attending: Family Medicine | Admitting: Family Medicine

## 2023-07-28 VITALS — BP 132/82 | HR 77 | Ht 76.0 in | Wt 220.6 lb

## 2023-07-28 DIAGNOSIS — N401 Enlarged prostate with lower urinary tract symptoms: Secondary | ICD-10-CM

## 2023-07-28 DIAGNOSIS — Z23 Encounter for immunization: Secondary | ICD-10-CM

## 2023-07-28 DIAGNOSIS — E781 Pure hyperglyceridemia: Secondary | ICD-10-CM

## 2023-07-28 DIAGNOSIS — K219 Gastro-esophageal reflux disease without esophagitis: Secondary | ICD-10-CM

## 2023-07-28 DIAGNOSIS — E1169 Type 2 diabetes mellitus with other specified complication: Secondary | ICD-10-CM

## 2023-07-28 DIAGNOSIS — R338 Other retention of urine: Secondary | ICD-10-CM | POA: Diagnosis not present

## 2023-07-28 DIAGNOSIS — E785 Hyperlipidemia, unspecified: Secondary | ICD-10-CM

## 2023-07-28 DIAGNOSIS — Z794 Long term (current) use of insulin: Secondary | ICD-10-CM

## 2023-07-28 DIAGNOSIS — N1831 Chronic kidney disease, stage 3a: Secondary | ICD-10-CM

## 2023-07-28 DIAGNOSIS — M25551 Pain in right hip: Secondary | ICD-10-CM | POA: Diagnosis not present

## 2023-07-28 DIAGNOSIS — E1122 Type 2 diabetes mellitus with diabetic chronic kidney disease: Secondary | ICD-10-CM

## 2023-07-28 DIAGNOSIS — Z125 Encounter for screening for malignant neoplasm of prostate: Secondary | ICD-10-CM

## 2023-07-28 MED ORDER — FENOFIBRATE 48 MG PO TABS: 48.0000 mg | ORAL_TABLET | Freq: Every day | ORAL | 3 refills | Status: DC

## 2023-07-28 MED ORDER — ALFUZOSIN HCL ER 10 MG PO TB24: 10.0000 mg | ORAL_TABLET | Freq: Every day | ORAL | 1 refills | Status: DC

## 2023-07-28 MED ORDER — PANTOPRAZOLE SODIUM 40 MG PO TBEC: 40.0000 mg | DELAYED_RELEASE_TABLET | Freq: Every day | ORAL | 1 refills | Status: DC

## 2023-07-28 MED ORDER — ATORVASTATIN CALCIUM 10 MG PO TABS
10.0000 mg | ORAL_TABLET | Freq: Every day | ORAL | 1 refills | Status: DC
Start: 2023-07-28 — End: 2023-09-08

## 2023-07-28 MED ORDER — LOSARTAN POTASSIUM 25 MG PO TABS: 25.0000 mg | ORAL_TABLET | Freq: Every day | ORAL | 1 refills | Status: DC

## 2023-07-28 NOTE — Progress Notes (Signed)
 Subjective:  Patient ID: [name]., male    DOB: [birth year]  Age: 58 y.o. MRN: [id number]  CC: Hip Pain (Discuss liver)     Discussed the use of AI scribe software for clinical note transcription with the patient, who gave verbal consent to proceed.  History of Present Illness 58 y.o. 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 He expresses concern about a previously diagnosed fatty liver, although he has not experienced any symptoms related to this condition. The patient's last CT scan in 2013/08/04 did not show any fatty deposits on the liver. His LFTs have also been normal.  The patient also reports persistent pain in his right hip, which has been present for a couple of years and seems to have worsened recently. The pain is constant and intensifies upon standing. The patient has had two hip replacements in the past and has not seen an orthopedic doctor recently for this issue.  In addition, the patient has noticed an increase in acid reflux symptoms, particularly at night. He has been taking pantoprazole for this condition and admits to occasionally eating late at night.  The patient also mentions a history of smoking but has switched to vaping in the past five years. He expresses a willingness to quit vaping.    Past Medical History:  Diagnosis Date   ADD (attention deficit disorder)    Allergy    Anxiety    Arthritis    right hip   Bipolar 1 disorder (HCC)    Blood in urine    CKD (chronic kidney disease), stage III (HCC)    Creatinine elevation    Dementia (HCC)    "early onset" (08/04/2017)   Depression    bipolar [facility]   Diabetes mellitus without complication (HCC)     Dizziness    Family history of anesthesia complication    pt is unsure , but pt father may have been difficult to arouse    Frequent falls    HCAP (healthcare-associated pneumonia) 10/31/2012   History of kidney stones    Hypertension    Hypogonadism male    Liver fatty degeneration    Lumbar radiculopathy    Microscopic hematuria    hereditary s/p Urology eval   Neuromuscular disorder (HCC)    feet neuropathy    Osteoarthritis of right hip 11/28/2011   2012 2015 s/p THR Severe  Dr Margreta Journey     Pleural effusion 11/02/2012   Pneumonia 10/2012   Pneumonia, organism unspecified(486) 11/02/2012   Polysubstance dependence, non-opioid, in remission (HCC)    remote   Primary osteoarthritis of left hip 05/22/2015   PTSD (post-traumatic stress disorder)    SOCIAL ANXIETY DISORDER    Schizoaffective disorder (HCC)    Substance abuse (HCC)    Suicide attempt by multiple drug overdose (HCC) 12-28-15   Grieving his cat's death 08/05/2015    Past Surgical History:  Procedure Laterality Date   BACK SURGERY     CLOSED REDUCTION METACARPAL WITH PERCUTANEOUS PINNING Right    COLONOSCOPY  03/21/2020   LUMBAR DISC SURGERY     TONSILLECTOMY     TOTAL HIP ARTHROPLASTY Right 08/16/2013   Procedure: TOTAL HIP ARTHROPLASTY ANTERIOR APPROACH;  Surgeon: Velna Ochs, MD;  Location: [facility]  OR;  Service: Orthopedics;  Laterality: Right;   TOTAL HIP ARTHROPLASTY Left 05/22/2015   Procedure: TOTAL HIP ARTHROPLASTY ANTERIOR APPROACH;  Surgeon: Marcene Corning, MD;  Location: [facility] OR;  Service: Orthopedics;  Laterality: Left;    Family History  Problem Relation Age of Onset   Diabetes Father    Cancer Mother        died of melanoma with mets   Cervical cancer Sister    Diabetes Sister    Other Neg Hx        hypogonadism   Colon cancer Neg Hx    Colon polyps Neg Hx    Esophageal cancer Neg Hx    Rectal cancer Neg Hx    Stomach cancer Neg Hx     Social History   Socioeconomic History   Marital  status: Single    Spouse name: Not on file   Number of children: 0   Years of education: 14   Highest education level: Some college, no degree  Occupational History   Occupation: disability  Tobacco Use   Smoking status: Former    Current packs/day: 0.00    Types: Cigarettes    Start date: 12/21/2018    Quit date: 03/22/2019    Years since quitting: 4.3   Smokeless tobacco: Never  Vaping Use   Vaping status: Never Used  Substance and Sexual Activity   Alcohol use: Not Currently   Drug use: Not Currently    Types: Marijuana, "Crack" cocaine, Cocaine    Comment: hx of marijuana/cocaine/crack use but sober since 20's   Sexual activity: Not Currently  Other Topics Concern   Not on file  Social History Narrative   05/08/2021 lives alone, sister Selena Batten helps with meds, he has some in home care, lived with sister until Nov 2020   Caffeine- sodas, amount  varies   regular exercise-no   Social Drivers of Health   Financial Resource Strain: Low Risk  (07/27/2023)   Overall Financial Resource Strain (CARDIA)    Difficulty of Paying Living Expenses: Not very hard  Food Insecurity: Food Insecurity Present (07/27/2023)   Hunger Vital Sign    Worried About Running Out of Food in the Last Year: Sometimes true    Ran Out of Food in the Last Year: Never true  Transportation Needs: Unmet Transportation Needs (07/27/2023)   PRAPARE - Administrator, Civil Service (Medical): Yes    Lack of Transportation (Non-Medical): No  Physical Activity: Inactive (07/27/2023)   Exercise Vital Sign    Days of Exercise per Week: 0 days    Minutes of Exercise per Session: 0 min  Stress: Stress Concern Present (07/27/2023)   [employer] of Occupational Health - Occupational Stress Questionnaire    Feeling of Stress : Very much  Social Connections: Socially Isolated (07/27/2023)   Social Connection and Isolation Panel [NHANES]    Frequency of Communication with Friends and Family: Once a week     Frequency of Social Gatherings with Friends and Family: Never    Attends Religious Services: Never    Database administrator or Organizations: No    Attends Engineer, structural: Never    Marital Status: Never married    No Known Allergies  Outpatient Medications Prior to Visit  Medication Sig Dispense Refill   acetaminophen (TYLENOL) 500 MG tablet Take 1 tablet (500 mg total) by mouth every 8 (eight) hours as needed. 90 tablet 1   ARIPiprazole (ABILIFY) 10 MG tablet Take 1  tablet (10 mg total) by mouth daily. 30 tablet 0   buPROPion (WELLBUTRIN XL) 300 MG 24 hr tablet SMARTSIG:1.0 Tablet(s) By Mouth Daily     clonazePAM (KLONOPIN) 0.5 MG tablet Take by mouth.     Continuous Blood Gluc Receiver (FREESTYLE LIBRE READER) DEVI Use as directed 1 each each   DROPLET PEN NEEDLES 31G X 5 MM MISC USE 4 TIMES DAILY 400 each 3   Dulaglutide (TRULICITY) 3 MG/0.5ML SOPN Inject 3 mg into the skin once a week. 6 mL 3   fluticasone (FLONASE) 50 MCG/ACT nasal spray USE 1 SPRAY IN EACH NOSTRIL EVERY DAY 32 g 2   glucose blood (ACCU-CHEK GUIDE) test strip CHECK SUGAR FOUR TIMES DAILY 100 each 12   HUMALOG KWIKPEN 200 UNIT/ML KwikPen INJECT 15-20 UNITS INTO THE SKIN 3 TIMES DAILY BEFORE MEALS. (DISCARD PEN 28 DAYS AFTER OPENING) 30 mL 3   insulin glargine (LANTUS SOLOSTAR) 100 UNIT/ML Solostar Pen Inject 40 Units into the skin daily. 45 mL 3   Multiple Vitamins-Minerals (MULTIVITAMIN WITH MINERALS) tablet Take 1 tablet by mouth daily.     traZODone (DESYREL) 100 MG tablet Take 200 mg by mouth at bedtime as needed.     alfuzosin (UROXATRAL) 10 MG 24 hr tablet Take 1 tablet (10 mg total) by mouth daily with breakfast. 90 tablet 1   atorvastatin (LIPITOR) 10 MG tablet Take 1 tablet (10 mg total) by mouth daily. 90 tablet 1   fenofibrate (TRICOR) 48 MG tablet TAKE 1 TABLET EVERY DAY 90 tablet 3   losartan (COZAAR) 25 MG tablet Take 1 tablet (25 mg total) by mouth daily. 90 tablet 1   pantoprazole  (PROTONIX) 40 MG tablet Take 1 tablet (40 mg total) by mouth daily. 90 tablet 1   No facility-administered medications prior to visit.     ROS Review of Systems  Constitutional:  Negative for activity change and appetite change.  HENT:  Negative for sinus pressure and sore throat.   Respiratory:  Negative for chest tightness, shortness of breath and wheezing.   Cardiovascular:  Negative for chest pain and palpitations.  Gastrointestinal:  Negative for abdominal distention, abdominal pain and constipation.  Genitourinary: Negative.   Musculoskeletal:        See HPI  Psychiatric/Behavioral:  Negative for behavioral problems and dysphoric mood.     Objective:  BP 132/82   Pulse 77   Ht 6\' 4"  (1.93 m)   Wt 220 lb 9.6 oz (100.1 kg)   SpO2 98%   BMI 26.85 kg/m      07/28/2023   10:56 AM 04/03/2023    2:01 PM 03/30/2023    4:10 PM  BP/Weight  Systolic BP 132 146 101  Diastolic BP 82 80 73  Wt. (Lbs) 220.6 228.2   BMI 26.85 kg/m2 27.78 kg/m2       Physical Exam Constitutional:      Appearance: He is well-developed.  Cardiovascular:     Rate and Rhythm: Normal rate.     Heart sounds: Normal heart sounds. No murmur heard. Pulmonary:     Effort: Pulmonary effort is normal.     Breath sounds: Normal breath sounds. No wheezing or rales.  Chest:     Chest wall: No tenderness.  Abdominal:     General: Bowel sounds are normal. There is no distension.     Palpations: Abdomen is soft. There is no mass.     Tenderness: There is no abdominal tenderness.  Musculoskeletal:  General: Normal range of motion.     Right lower leg: No edema.     Left lower leg: No edema.  Neurological:     Mental Status: He is alert and oriented to person, place, and time.  Psychiatric:        Mood and Affect: Mood normal.        Latest Ref Rng & Units 03/26/2023   11:36 AM 02/10/2022    3:52 PM 01/03/2022    5:57 AM  CMP  Glucose 70 - 99 mg/dL 366  440  347   BUN 6 - 24 mg/dL 19  16   17    Creatinine 0.76 - 1.27 mg/dL 4.25  9.56  3.87   Sodium 134 - 144 mmol/L 138  143  140   Potassium 3.5 - 5.2 mmol/L 4.7  5.2  3.6   Chloride 96 - 106 mmol/L 98  105  110   CO2 20 - 29 mmol/L 23  24  23    Calcium 8.7 - 10.2 mg/dL 9.7  56.4  9.2   Total Protein 6.0 - 8.5 g/dL 7.6  7.3    Total Bilirubin 0.0 - 1.2 mg/dL 0.3  0.3    Alkaline Phos 44 - 121 IU/L 87  73    AST 0 - 40 IU/L 29  20    ALT 0 - 44 IU/L 26  23      Lipid Panel     Component Value Date/Time   CHOL 139 07/16/2022 1144   CHOL 167 07/06/2020 1116   TRIG 285.0 (H) 07/16/2022 1144   HDL 43.20 07/16/2022 1144   HDL 39 (L) 07/06/2020 1116   CHOLHDL 3 07/16/2022 1144   VLDL 57.0 (H) 07/16/2022 1144   LDLCALC 76 07/06/2020 1116   LDLDIRECT 62.0 07/16/2022 1144    CBC    Component Value Date/Time   WBC 7.9 02/10/2022 1552   WBC 3.7 (L) 01/02/2022 0426   RBC 5.20 02/10/2022 1552   RBC 4.36 01/02/2022 0426   HGB 15.3 02/10/2022 1552   HCT 45.6 02/10/2022 1552   PLT 177 02/10/2022 1552   MCV 88 02/10/2022 1552   MCH 29.4 02/10/2022 1552   MCH 28.7 01/02/2022 0426   MCHC 33.6 02/10/2022 1552   MCHC 33.4 01/02/2022 0426   RDW 13.3 02/10/2022 1552   LYMPHSABS 1.3 12/15/2021 0659   LYMPHSABS 1.7 11/19/2021 1640   MONOABS 0.5 12/15/2021 0659   EOSABS 0.1 12/15/2021 0659   EOSABS 0.0 11/19/2021 1640   BASOSABS 0.0 12/15/2021 0659   BASOSABS 0.0 11/19/2021 1640    Lab Results  Component Value Date   HGBA1C 7.9 (A) 04/03/2023       Assessment & Plan Right Hip Pain Chronic right hip pain post bilateral hip replacement with episodes of giving out and leg weakness. Referral to orthopedics necessary for evaluation of potential complications. - Refer to orthopedics for evaluation of right hip pain and potential complications post hip replacement.  Chronic Kidney Disease Stage 3 Chronic kidney disease stage 3 with last creatinine elevation to 1.76 in 03/2023 up from normal a year prior. -Advised that  this is likely secondary to a combination of Diabetic and Hypertensive nephropathy as his A1c had been running high in the preceding year. Under nephrology care. Repeat test needed to assess kidney function. - Order comprehensive metabolic panel to reassess kidney function and creatinine levels.  Type 2 Diabetes Mellitus Type 2 diabetes mellitus with last A1c of 7.9. Under endocrinologist care with  upcoming appointment.  Benign Prostatic Hyperplasia Benign prostatic hyperplasia managed with alfuzosin. Symptoms controlled. Continues urologist care.  Gastroesophageal Reflux Disease Gastroesophageal reflux disease with recent symptom exacerbation. Advised to avoid late-night eating. - Advise to avoid eating late at night and remain upright for at least two hours after eating.  Vaping Use Current vaping use with history of smoking cessation. Discussed risks and encouraged cessation to prevent lung damage and reduce cardiovascular risk. - Encourage cessation of vaping to prevent lung damage and reduce cardiovascular risk.  General Health Maintenance Due for lung cancer screening. Pneumonia vaccination series incomplete; last dose in 2022. Pneumonia 20 vaccine recommended. - Administer pneumonia 20 vaccine to complete vaccination series. - Encourage completion of lung cancer screening.  Follow-up Follow-up plans discussed for various conditions. Monitoring of liver function and creatinine levels necessary. - Follow up with orthopedics after referral for right hip evaluation. - Monitor liver function and creatinine levels; consider ultrasound if liver enzymes are abnormal.Explained that we do not have radiologic evidence of hepatic steatosis and I offered to order a RUQ Ultrasound but he would like to hold off since LFTs are normal - Contact with lab results.      Meds ordered this encounter  Medications   alfuzosin (UROXATRAL) 10 MG 24 hr tablet    Sig: Take 1 tablet (10 mg total) by  mouth daily with breakfast.    Dispense:  90 tablet    Refill:  1   atorvastatin (LIPITOR) 10 MG tablet    Sig: Take 1 tablet (10 mg total) by mouth daily.    Dispense:  90 tablet    Refill:  1   fenofibrate (TRICOR) 48 MG tablet    Sig: Take 1 tablet (48 mg total) by mouth daily.    Dispense:  90 tablet    Refill:  3   losartan (COZAAR) 25 MG tablet    Sig: Take 1 tablet (25 mg total) by mouth daily.    Dispense:  90 tablet    Refill:  1   pantoprazole (PROTONIX) 40 MG tablet    Sig: Take 1 tablet (40 mg total) by mouth daily.    Dispense:  90 tablet    Refill:  1    Follow-up: Return in about 6 months (around 01/27/2024) for Chronic medical conditions.       Hoy Register, MD, FAAFP. [facility] [place], [place] [phone]   07/28/2023, 12:44 PM

## 2023-07-28 NOTE — Patient Instructions (Signed)
 VISIT SUMMARY:  During today's visit, we discussed several of your ongoing health concerns, including your right hip pain, chronic kidney disease, diabetes, benign prostatic hyperplasia, gastroesophageal reflux disease, and vaping use. We also reviewed your general health maintenance needs.  YOUR PLAN:  -RIGHT HIP PAIN: You have chronic right hip pain following your hip replacements, and it has been worsening. We will refer you to an orthopedic specialist to evaluate potential complications and determine the best course of action.  -CHRONIC KIDNEY DISEASE STAGE 3: Chronic kidney disease stage 3 means your kidneys are moderately damaged and not working as well as they should. We will order a comprehensive metabolic panel to reassess your kidney function and creatinine levels.  -DIABETES MELLITUS: Type 2 diabetes mellitus is a condition where your body does not use insulin properly, leading to high blood sugar levels. Your last A1c was 7.9, and you are under the care of an endocrinologist with an upcoming appointment.  -BENIGN PROSTATIC HYPERPLASIA: Benign prostatic hyperplasia is an enlarged prostate gland that can cause urinary symptoms. Your symptoms are currently controlled with alfuzosin, and you will continue to see your urologist for management.  -GASTROESOPHAGEAL REFLUX DISEASE: Gastroesophageal reflux disease (GERD) is when stomach acid frequently flows back into the tube connecting your mouth and stomach. To help manage your symptoms, avoid eating late at night and remain upright for at least two hours after eating.  -VAPING USE: Vaping can cause lung damage and increase cardiovascular risk. We discussed the risks, and I encourage you to quit vaping to improve your overall health.  -GENERAL HEALTH MAINTENANCE: You are due for lung cancer screening and need to complete your pneumonia vaccination series. We recommend administering the pneumonia 20 vaccine to complete the series and encourage  you to complete the lung cancer screening.  INSTRUCTIONS:  - Follow up with orthopedics after referral for right hip evaluation. - Monitor liver function and creatinine levels; consider ultrasound if liver enzymes are abnormal. - Contact with lab results.

## 2023-07-29 ENCOUNTER — Encounter: Payer: Self-pay | Admitting: Family Medicine

## 2023-07-29 LAB — PSA, TOTAL AND FREE
PSA, Free Pct: 19.4 %
PSA, Free: 1.3 ng/mL
Prostate Specific Ag, Serum: 6.7 ng/mL — ABNORMAL HIGH (ref 0.0–4.0)

## 2023-07-29 LAB — CMP14+EGFR
ALT: 25 IU/L (ref 0–44)
AST: 28 IU/L (ref 0–40)
Albumin: 4.6 g/dL (ref 3.8–4.9)
Alkaline Phosphatase: 80 IU/L (ref 44–121)
BUN/Creatinine Ratio: 13 (ref 9–20)
BUN: 21 mg/dL (ref 6–24)
Bilirubin Total: 0.4 mg/dL (ref 0.0–1.2)
CO2: 25 mmol/L (ref 20–29)
Calcium: 9.9 mg/dL (ref 8.7–10.2)
Chloride: 100 mmol/L (ref 96–106)
Creatinine, Ser: 1.63 mg/dL — ABNORMAL HIGH (ref 0.76–1.27)
Globulin, Total: 2.4 g/dL (ref 1.5–4.5)
Glucose: 142 mg/dL — ABNORMAL HIGH (ref 70–99)
Potassium: 5.1 mmol/L (ref 3.5–5.2)
Sodium: 140 mmol/L (ref 134–144)
Total Protein: 7 g/dL (ref 6.0–8.5)
eGFR: 49 mL/min/{1.73_m2} — ABNORMAL LOW (ref >59)

## 2023-08-03 ENCOUNTER — Encounter: Payer: Self-pay | Admitting: Internal Medicine

## 2023-08-03 ENCOUNTER — Ambulatory Visit (INDEPENDENT_AMBULATORY_CARE_PROVIDER_SITE_OTHER): Payer: Medicare HMO | Admitting: Internal Medicine

## 2023-08-03 VITALS — BP 124/80 | HR 93 | Ht 76.0 in | Wt 217.6 lb

## 2023-08-03 DIAGNOSIS — N184 Chronic kidney disease, stage 4 (severe): Secondary | ICD-10-CM | POA: Diagnosis not present

## 2023-08-03 DIAGNOSIS — E785 Hyperlipidemia, unspecified: Secondary | ICD-10-CM | POA: Diagnosis not present

## 2023-08-03 DIAGNOSIS — Z794 Long term (current) use of insulin: Secondary | ICD-10-CM | POA: Diagnosis not present

## 2023-08-03 DIAGNOSIS — E1122 Type 2 diabetes mellitus with diabetic chronic kidney disease: Secondary | ICD-10-CM

## 2023-08-03 LAB — POCT GLYCOSYLATED HEMOGLOBIN (HGB A1C): Hemoglobin A1C: 6.1 % — AB (ref 4.0–5.6)

## 2023-08-03 NOTE — Patient Instructions (Addendum)
 Please use the following regimen: - Lantus  25 units at bedtime - Trulicity 3 mg weekly - Humalog U200:  15 min before meals  - 15-20 units before protein shakes or meals  Please come back for a follow-up appointment in 3-4 months.

## 2023-08-03 NOTE — Progress Notes (Addendum)
 Patient ID: [name]., male   DOB: July 03, 1965, 58 y.o.   MRN: [id number]  HPI: [name]. is a 58 y.o.-year-old male, returning for follow-up for DM2, dx in 2012, insulin-dependent since 2017, uncontrolled, with complications (stage 4 CKD, PN, fatty liver). Last OV 4 mo ago.  Interim hx: He has no increased urination, no blurry vision, nausea, chest pain.   Still has back pain and uses a cane or a walker.   Reviewed HbA1c: Lab Results  Component Value Date   HGBA1C 7.9 (A) 04/03/2023   HGBA1C 7.0 12/05/2022   HGBA1C 7.3 (A) 07/16/2022   HGBA1C 8.7 (A) 04/10/2022   HGBA1C 8.0 (H) 12/02/2021   HGBA1C 9.4 (A) 09/02/2021   HGBA1C 7.8 (A) 06/11/2021   HGBA1C 7.1 (H) 05/27/2021   HGBA1C 8.1 (A) 11/14/2020   HGBA1C 8.0 (A) 08/30/2020   Pt was on a regimen of: - NPH 45 units in a.m. Prev. On Lantus,  Levemir, NovoLog, Humalog, Tradjenta, Metformin.  He is now on: - Lantus 20 units before b'fast and 20 units at bedtime >> 40 units in am as he was forgetting to bedtime dose - Humalog U200:  - 15  >> 20 >> 30 units AFTER  protein shake in am >> 15 minutes before - 18-20 >> 15-20 >> 30 units AFTER  dinner >> 15 minutes before - Trulicity 1.5 >> 3 mg weekly  Pt checks his sugars >4x a day with the Libre CGM - now 3+ - from [facility]:  Previously:  Previously:   Lowest sugar was  60s >> 59 >> 54; he has hypoglycemia awareness at 70.  Highest sugar was 200s >> HI >> 300s  Glucometer: Accu-Chek guide  Meals: - Protein shake (20 units) - Lunch  - skips >> protein shake - Dinner - potato cheddar bake (15 units) >> protein shake  - + CKD-sees nephrology, last BUN/creatinine:  Lab Results  Component Value Date   BUN 21 07/28/2023   BUN 19 03/26/2023   CREATININE 1.63 (H) 07/28/2023   CREATININE 1.76 (H) 03/26/2023  On lisinopril.  - + HL; last set of lipids: Lab Results  Component Value Date   CHOL 139 07/16/2022   HDL 43.20 07/16/2022   LDLCALC  76 07/06/2020   LDLDIRECT 62.0 07/16/2022   TRIG 285.0 (H) 07/16/2022   CHOLHDL 3 07/16/2022  On Lipitor 10 mg daily Tricor 48 mg daily. He could not take fish oil 2/2 large capsule.  - last eye exam was in  09/2022. No DR reportedly.   - no numbness and tingling in his feet.  On Neurontin 300 mg twice a day.  Last foot exam by [facility] 06/17/2023.  He also has a history of hypogonadism, HTN, back pain, bipolar disorder, PTSD, history of suicide attempt. He undergoes ECT treatments. He has a history of schizoaffective disorder and also poor memory.  He has an aide that helps him.  He gives his own insulin. He was admitted 11/2021 for pneumonia.  In 2023, he had a thyroid ultrasound showing 3 nodules, of which do need to be followed with annual ultrasounds.  This is managed by PCP.  ROS: + see HPI  Past Medical History:  Diagnosis Date   ADD (attention deficit disorder)    Allergy    Anxiety    Arthritis    right hip   Bipolar 1 disorder (HCC)    Blood in urine    CKD (chronic kidney disease), stage III (HCC)  Creatinine elevation    Dementia (HCC)    "early onset" (08/04/2017)   Depression    bipolar [facility]   Diabetes mellitus without complication (HCC)    Dizziness    Family history of anesthesia complication    pt is unsure , but pt father may have been difficult to arouse    Frequent falls    HCAP (healthcare-associated pneumonia) 10/31/2012   History of kidney stones    Hypertension    Hypogonadism male    Liver fatty degeneration    Lumbar radiculopathy    Microscopic hematuria    hereditary s/p Urology eval   Neuromuscular disorder (HCC)    feet neuropathy    Osteoarthritis of right hip 11/28/2011   2012 2015 s/p THR Severe  Dr Margreta Journey     Pleural effusion 11/02/2012   Pneumonia 10/2012   Pneumonia, organism unspecified(486) 11/02/2012   Polysubstance dependence, non-opioid, in remission (HCC)    remote   Primary osteoarthritis of left hip  05/22/2015   PTSD (post-traumatic stress disorder)    SOCIAL ANXIETY DISORDER    Schizoaffective disorder (HCC)    Substance abuse (HCC)    Suicide attempt by multiple drug overdose (HCC) 01/03/16   Grieving his cat's death 08-19-15   Past Surgical History:  Procedure Laterality Date   BACK SURGERY     CLOSED REDUCTION METACARPAL WITH PERCUTANEOUS PINNING Right    COLONOSCOPY  03/21/2020   LUMBAR DISC SURGERY     TONSILLECTOMY     TOTAL HIP ARTHROPLASTY Right 08/16/2013   Procedure: TOTAL HIP ARTHROPLASTY ANTERIOR APPROACH;  Surgeon: Velna Ochs, MD;  Location: [facility] OR;  Service: Orthopedics;  Laterality: Right;   TOTAL HIP ARTHROPLASTY Left 05/22/2015   Procedure: TOTAL HIP ARTHROPLASTY ANTERIOR APPROACH;  Surgeon: Marcene Corning, MD;  Location: [facility] OR;  Service: Orthopedics;  Laterality: Left;   Social History   Socioeconomic History   Marital status: Single    Spouse name: Not on file   Number of children: 0   Years of education: 14   Highest education level: Some college, no degree  Occupational History   Occupation: disability  Tobacco Use   Smoking status: Former    Current packs/day: 0.00    Types: Cigarettes    Start date: 12/21/2018    Quit date: 03/22/2019    Years since quitting: 4.3   Smokeless tobacco: Never  Vaping Use   Vaping status: Never Used  Substance and Sexual Activity   Alcohol use: Not Currently   Drug use: Not Currently    Types: Marijuana, "Crack" cocaine, Cocaine    Comment: hx of marijuana/cocaine/crack use but sober since 08-19-2023   Sexual activity: Not Currently  Other Topics Concern   Not on file  Social History Narrative   05/08/2021 lives alone, sister Selena Batten helps with meds, he has some in home care, lived with sister until Nov 2020   Caffeine- sodas, amount  varies   regular exercise-no   Social Drivers of Health   Financial Resource Strain: Low Risk  (07/27/2023)   Overall Financial Resource Strain (CARDIA)    Difficulty of Paying Living  Expenses: Not very hard  Food Insecurity: Food Insecurity Present (07/27/2023)   Hunger Vital Sign    Worried About Running Out of Food in the Last Year: Sometimes true    Ran Out of Food in the Last Year: Never true  Transportation Needs: Unmet Transportation Needs (07/27/2023)   PRAPARE - Transportation    Lack of Transportation (  Medical): Yes    Lack of Transportation (Non-Medical): No  Physical Activity: Inactive (07/27/2023)   Exercise Vital Sign    Days of Exercise per Week: 0 days    Minutes of Exercise per Session: 0 min  Stress: Stress Concern Present (07/27/2023)   [employer] of Occupational Health - Occupational Stress Questionnaire    Feeling of Stress : Very much  Social Connections: Socially Isolated (07/27/2023)   Social Connection and Isolation Panel [NHANES]    Frequency of Communication with Friends and Family: Once a week    Frequency of Social Gatherings with Friends and Family: Never    Attends Religious Services: Never    Database administrator or Organizations: No    Attends Banker Meetings: Never    Marital Status: Never married  Intimate Partner Violence: Not At Risk (10/29/2022)   Humiliation, Afraid, Rape, and Kick questionnaire    Fear of Current or Ex-Partner: No    Emotionally Abused: No    Physically Abused: No    Sexually Abused: No   Current Outpatient Medications on File Prior to Visit  Medication Sig Dispense Refill   acetaminophen (TYLENOL) 500 MG tablet Take 1 tablet (500 mg total) by mouth every 8 (eight) hours as needed. 90 tablet 1   alfuzosin (UROXATRAL) 10 MG 24 hr tablet Take 1 tablet (10 mg total) by mouth daily with breakfast. 90 tablet 1   ARIPiprazole (ABILIFY) 10 MG tablet Take 1 tablet (10 mg total) by mouth daily. 30 tablet 0   atorvastatin (LIPITOR) 10 MG tablet Take 1 tablet (10 mg total) by mouth daily. 90 tablet 1   buPROPion (WELLBUTRIN XL) 300 MG 24 hr tablet SMARTSIG:1.0 Tablet(s) By Mouth Daily     clonazePAM  (KLONOPIN) 0.5 MG tablet Take by mouth.     Continuous Blood Gluc Receiver (FREESTYLE LIBRE READER) DEVI Use as directed 1 each each   DROPLET PEN NEEDLES 31G X 5 MM MISC USE 4 TIMES DAILY 400 each 3   Dulaglutide (TRULICITY) 3 MG/0.5ML SOPN Inject 3 mg into the skin once a week. 6 mL 3   fenofibrate (TRICOR) 48 MG tablet Take 1 tablet (48 mg total) by mouth daily. 90 tablet 3   fluticasone (FLONASE) 50 MCG/ACT nasal spray USE 1 SPRAY IN EACH NOSTRIL EVERY DAY 32 g 2   glucose blood (ACCU-CHEK GUIDE) test strip CHECK SUGAR FOUR TIMES DAILY 100 each 12   HUMALOG KWIKPEN 200 UNIT/ML KwikPen INJECT 15-20 UNITS INTO THE SKIN 3 TIMES DAILY BEFORE MEALS. (DISCARD PEN 28 DAYS AFTER OPENING) 30 mL 3   insulin glargine (LANTUS SOLOSTAR) 100 UNIT/ML Solostar Pen Inject 40 Units into the skin daily. 45 mL 3   losartan (COZAAR) 25 MG tablet Take 1 tablet (25 mg total) by mouth daily. 90 tablet 1   Multiple Vitamins-Minerals (MULTIVITAMIN WITH MINERALS) tablet Take 1 tablet by mouth daily.     pantoprazole (PROTONIX) 40 MG tablet Take 1 tablet (40 mg total) by mouth daily. 90 tablet 1   traZODone (DESYREL) 100 MG tablet Take 200 mg by mouth at bedtime as needed.     No current facility-administered medications on file prior to visit.   No Known Allergies  Family History  Problem Relation Age of Onset   Diabetes Father    Cancer Mother        died of melanoma with mets   Cervical cancer Sister    Diabetes Sister    Other Neg Hx  hypogonadism   Colon cancer Neg Hx    Colon polyps Neg Hx    Esophageal cancer Neg Hx    Rectal cancer Neg Hx    Stomach cancer Neg Hx    PE: BP 124/80   Pulse 93   Ht 6\' 4"  (1.93 m)   Wt 217 lb 9.6 oz (98.7 kg)   SpO2 98%   BMI 26.49 kg/m  Wt Readings from Last 3 Encounters:  08/03/23 217 lb 9.6 oz (98.7 kg)  07/28/23 220 lb 9.6 oz (100.1 kg)  04/03/23 228 lb 3.2 oz (103.5 kg)   Constitutional: overweight, in NAD, walks with a cane Eyes:  EOMI, no  exophthalmos ENT: no neck masses, no cervical lymphadenopathy Cardiovascular: RRR, No MRG Respiratory: CTA B Musculoskeletal: no deformities Skin:no rashes Neurological: no tremor with outstretched hands  ASSESSMENT: 1. DM2, insulin-dependent, uncontrolled, with complications - CKD - PN - fatty liver  2. HL  PLAN:  1. Patient with longstanding, uncontrolled, type 2 diabetes, on basal-bolus insulin regimen along with weekly GLP-1 receptor agonist, which we increased at last visit.  HbA1c at that time was higher, at 7.9%.  Sugars were fluctuating around the upper limit of the target range but with significant increases after his breakfast, which was a protein shake.  Upon questioning, he did not start to take his insulin 15 minutes before meals as advised at the previous visit and was taking it after the meal.  I strongly advised him to move this 15 minutes prior to starting to eat or drink the shakes.  We also increased the dose of Humalog in AM.  We continued the rest of the regimen. CGM interpretation: -At today's visit, we reviewed his CGM downloads: It appears that 85% of values are in target range (goal >70%), while 13% are higher than 180 (goal <25%), and 2% are lower than 70 (goal <4%).  The calculated average blood sugar is 138.  The projected HbA1c for the next 3 months (GMI) is 6.6%. -Reviewing the CGM trends, sugars appear improved from last visit, but he does have hyperglycemic exceptions especially overnight and he mentions that he usually wakes up with higher blood sugars which he is trying to bring down.  Later in the day, he has occasional lows in the 60s and even 50s. - Upon questioning, he is taking a higher dose of Humalog in the morning, 30 units, and he waits more than an hour before drinking his shakes.  After drinking the protein shake, sugar still increase and then start to improve.  Later in the day, he is not taking Humalog before lunch and dinner, which now are all  protein shakes, but only if the sugars are high after these.  In that case, he corrects with 20 to 25 units of Humalog.  We discussed that this is not ideal, I did advise him to reduce the dose of Humalog before and recheck between 15 to 20 units but we also need to improve his sugars overnight.  We discussed about possible reasons for this sugars being elevated.  For now, I recommended to move the Lantus at bedtime, but to reduce the dose to avoid hypoglycemia overnight.  We may need to increase the dose slowly if the sugars start being elevated during the day. -At today's visit, we also discussed about the need to improve diet.  He is drinking protein shakes with every meal as he does not feel he can tolerate other foods.  He does not have any  fiber in the diet so we discussed about possibly including some vegetables or fruit with his protein shakes, at least.  Ideally, he would have regular, solid, food. - I suggested to:  Patient Instructions  Please use the following regimen: - Lantus  25 units at bedtime - Trulicity 3 mg weekly - Humalog U200:  15 min before meals  - 15-20 units before protein shakes or meals  Please come back for a follow-up appointment in 3-4 months.   - we checked his HbA1c: 6.1% (lower) - advised to check sugars at different times of the day - 4x a day, rotating check times - advised for yearly eye exams >> he is UTD - return to clinic in 3-4 months  2. HL - Latest lipid panel was reviewed from 06/2022, fractions at goal with exception of the high triglycerides: Lab Results  Component Value Date   CHOL 139 07/16/2022   HDL 43.20 07/16/2022   LDLCALC 76 07/06/2020   LDLDIRECT 62.0 07/16/2022   TRIG 285.0 (H) 07/16/2022   CHOLHDL 3 07/16/2022  - He continues on Lipitor 10 mg daily without side effects.  I previously also suggested fish oil 1000 mg twice a day but he felt that the capsule was too large.  He is on Tricor 48 mg daily now.  No side effects.   - he  would like to have a lipid panel checked fasting.  I put the orders in and he can return here to have it checked or in the PCPs office.  Emilie Harden, MD PhD [facility]

## 2023-08-04 DIAGNOSIS — E1122 Type 2 diabetes mellitus with diabetic chronic kidney disease: Secondary | ICD-10-CM | POA: Diagnosis not present

## 2023-08-17 ENCOUNTER — Other Ambulatory Visit (INDEPENDENT_AMBULATORY_CARE_PROVIDER_SITE_OTHER): Payer: Self-pay

## 2023-08-17 ENCOUNTER — Encounter: Payer: Self-pay | Admitting: Orthopaedic Surgery

## 2023-08-17 ENCOUNTER — Ambulatory Visit (INDEPENDENT_AMBULATORY_CARE_PROVIDER_SITE_OTHER): Admitting: Orthopaedic Surgery

## 2023-08-17 DIAGNOSIS — M79604 Pain in right leg: Secondary | ICD-10-CM | POA: Diagnosis not present

## 2023-08-17 MED ORDER — METHYLPREDNISOLONE ACETATE 40 MG/ML IJ SUSP
40.0000 mg | INTRAMUSCULAR | Status: AC | PRN
  Administered 2023-08-17: 40 mg via INTRA_ARTICULAR

## 2023-08-17 MED ORDER — LIDOCAINE HCL 1 % IJ SOLN
3.0000 mL | INTRAMUSCULAR | Status: AC | PRN
  Administered 2023-08-17: 3 mL

## 2023-08-17 NOTE — Progress Notes (Signed)
 The patient is a tall 58 year old gentleman I am seeing for the first time.  He actually has a history of both of his hips being replaced with the right hip replaced in 2015 and the left hip in 2017 by one of my colleagues in town.  He says that there used to be a leg length difference but after his second surgery that was corrected.  He comes in complaining of right hip pain of the trochanteric area especially when he sleeps but he also has low back pain to the right side.  He does have a history of some type of lumbar spine surgery by Dr. Nigel Bart in town who is since retired.  He does report that he has had epidural steroid injections in the past as well.  On my exam today both hips move smoothly and fluidly.  There is pain to palpation of the right hip trochanteric area and there is definitely some low back pain into the sciatic region on the right side.  I did have him lay in a supine position and his leg lengths are equal.  An AP pelvis and lateral of the right hip shows normal-appearing bilateral total hip arthroplasties with no complicating features.  An AP and lateral lumbar spine show hardware from an L4-L5 posterior fusion.  There is arthritic changes with degenerative disc disease between L5 and S1.  I did offer steroid injection over his right hip trochanteric area which he agreed to and tolerated well.  We will see about him getting follow-up with neurosurgery for further evaluation treatment of his low back pain with right-sided radicular issues.  This is appropriate seeing [place] [facility] again since his initial surgery was done over there.    Procedure Note  Patient: [name].             Date of Birth: May 01, 1965           MRN: [id number]             Visit Date: 08/17/2023  Procedures: Visit Diagnoses:  1. Pain in right leg     Large Joint Inj: R greater trochanter on 08/17/2023 3:00 PM Indications: pain and diagnostic evaluation Details: 22 G 1.5 in needle,  lateral approach  Arthrogram: No  Medications: 3 mL lidocaine  1 %; 40 mg methylPREDNISolone acetate 40 MG/ML Outcome: tolerated well, no immediate complications Procedure, treatment alternatives, risks and benefits explained, specific risks discussed. Consent was given by the patient. Immediately prior to procedure a time out was called to verify the correct patient, procedure, equipment, support staff and site/side marked as required. Patient was prepped and draped in the usual sterile fashion.

## 2023-08-18 ENCOUNTER — Other Ambulatory Visit: Payer: Self-pay

## 2023-08-18 DIAGNOSIS — M79604 Pain in right leg: Secondary | ICD-10-CM

## 2023-08-19 DIAGNOSIS — F4010 Social phobia, unspecified: Secondary | ICD-10-CM | POA: Diagnosis not present

## 2023-08-31 DIAGNOSIS — Z6826 Body mass index (BMI) 26.0-26.9, adult: Secondary | ICD-10-CM | POA: Diagnosis not present

## 2023-08-31 DIAGNOSIS — M4726 Other spondylosis with radiculopathy, lumbar region: Secondary | ICD-10-CM | POA: Diagnosis not present

## 2023-08-31 DIAGNOSIS — M51362 Other intervertebral disc degeneration, lumbar region with discogenic back pain and lower extremity pain: Secondary | ICD-10-CM | POA: Diagnosis not present

## 2023-09-03 DIAGNOSIS — E1122 Type 2 diabetes mellitus with diabetic chronic kidney disease: Secondary | ICD-10-CM | POA: Diagnosis not present

## 2023-09-07 ENCOUNTER — Other Ambulatory Visit: Payer: Self-pay | Admitting: Family Medicine

## 2023-09-07 ENCOUNTER — Other Ambulatory Visit: Payer: Self-pay | Admitting: Internal Medicine

## 2023-09-07 DIAGNOSIS — E1169 Type 2 diabetes mellitus with other specified complication: Secondary | ICD-10-CM

## 2023-09-08 DIAGNOSIS — F331 Major depressive disorder, recurrent, moderate: Secondary | ICD-10-CM | POA: Diagnosis not present

## 2023-09-09 DIAGNOSIS — F4010 Social phobia, unspecified: Secondary | ICD-10-CM | POA: Diagnosis not present

## 2023-09-10 ENCOUNTER — Telehealth: Payer: Self-pay | Admitting: Orthopaedic Surgery

## 2023-09-10 DIAGNOSIS — R3915 Urgency of urination: Secondary | ICD-10-CM | POA: Diagnosis not present

## 2023-09-10 DIAGNOSIS — R351 Nocturia: Secondary | ICD-10-CM | POA: Diagnosis not present

## 2023-09-10 DIAGNOSIS — R8271 Bacteriuria: Secondary | ICD-10-CM | POA: Diagnosis not present

## 2023-09-10 DIAGNOSIS — N401 Enlarged prostate with lower urinary tract symptoms: Secondary | ICD-10-CM | POA: Diagnosis not present

## 2023-09-10 DIAGNOSIS — N5201 Erectile dysfunction due to arterial insufficiency: Secondary | ICD-10-CM | POA: Diagnosis not present

## 2023-09-10 NOTE — Telephone Encounter (Signed)
 [name] w/[facility] [phone] lvm returning Sabrina's call.

## 2023-09-10 NOTE — Telephone Encounter (Signed)
 Called [name] back, lvm to return my call

## 2023-09-16 NOTE — Telephone Encounter (Signed)
Pt was seen on 05/22

## 2023-09-16 NOTE — Telephone Encounter (Signed)
 Called [name] back left vm to return my call

## 2023-09-22 ENCOUNTER — Ambulatory Visit: Payer: Medicare HMO | Admitting: Podiatry

## 2023-09-22 DIAGNOSIS — M4726 Other spondylosis with radiculopathy, lumbar region: Secondary | ICD-10-CM | POA: Diagnosis not present

## 2023-09-24 ENCOUNTER — Ambulatory Visit: Payer: Medicare HMO | Attending: Family Medicine | Admitting: Family Medicine

## 2023-09-24 ENCOUNTER — Encounter: Payer: Self-pay | Admitting: Family Medicine

## 2023-09-24 VITALS — BP 135/80 | HR 81 | Ht 76.0 in | Wt 228.0 lb

## 2023-09-24 DIAGNOSIS — E785 Hyperlipidemia, unspecified: Secondary | ICD-10-CM

## 2023-09-24 DIAGNOSIS — E1169 Type 2 diabetes mellitus with other specified complication: Secondary | ICD-10-CM | POA: Diagnosis not present

## 2023-09-24 DIAGNOSIS — Z7985 Long-term (current) use of injectable non-insulin antidiabetic drugs: Secondary | ICD-10-CM

## 2023-09-24 DIAGNOSIS — K5909 Other constipation: Secondary | ICD-10-CM

## 2023-09-24 DIAGNOSIS — F250 Schizoaffective disorder, bipolar type: Secondary | ICD-10-CM

## 2023-09-24 DIAGNOSIS — R09A2 Foreign body sensation, throat: Secondary | ICD-10-CM

## 2023-09-24 DIAGNOSIS — R270 Ataxia, unspecified: Secondary | ICD-10-CM | POA: Diagnosis not present

## 2023-09-24 DIAGNOSIS — R1319 Other dysphagia: Secondary | ICD-10-CM

## 2023-09-24 MED ORDER — POLYETHYLENE GLYCOL 3350 17 GM/SCOOP PO POWD: 17.0000 g | Freq: Every day | ORAL | 1 refills | Status: AC

## 2023-09-24 NOTE — Patient Instructions (Signed)
 VISIT SUMMARY:  During your visit, we discussed your ongoing anxiety and depression, difficulty swallowing pills, and constipation. We reviewed your current symptoms and made plans to address each issue.  YOUR PLAN:  -ANXIETY AND DEPRESSION: Anxiety and depression are mental health conditions that can cause persistent feelings of worry, sadness, and loss of interest in daily activities. We discussed the importance of transitioning to a new therapist for more frequent sessions and considering a medication adjustment with your psychiatrist if your symptoms do not improve.  -DYSPHAGIA: Dysphagia is difficulty swallowing, which can sometimes be caused by an obstruction in the esophagus. We will refer you to a gastroenterologist for an endoscopy to evaluate and potentially treat this issue.  -CONSTIPATION: Constipation is characterized by infrequent and hard bowel movements. We recommend increasing your fiber intake by eating more fruits, vegetables, and Activia yogurt, and making smoothies to help with your dental issues. Additionally, you should try over-the-counter Miralax . Please monitor your bowel movements and let us  know through MyChart if there is no improvement, so we can consider prescription medication.  INSTRUCTIONS:  Please follow up with a gastroenterologist for an endoscopy to evaluate your swallowing difficulties. Transition to a new therapist for more frequent sessions to help manage your anxiety and depression. Monitor your bowel movements and communicate any lack of improvement via MyChart.

## 2023-09-24 NOTE — Progress Notes (Signed)
 Subjective:  Patient ID: [name]., male    DOB: [birth year]  Age: 58 y.o. MRN: [id number]  CC: Medical Management of Chronic Issues (Handicap form/Constipation/Problems swallowing )     Discussed the use of AI scribe software for clinical note transcription with the patient, who gave verbal consent to proceed.  History of Present Illness [name]. 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  who presents with cessation of his pills being stuck in his throat and constipation.  He experiences significant anxiety and depression uncontrolled on his current regimen.  He is overmedicated for his psychiatrist, which affects his psychiatrist's willingness to adjust his medication, including Klonopin . Therapy sessions have decreased to once a month due to his therapist's impending retirement.  Constipation is present with bowel movements approximately once a week, characterized by very hard and large stools. His diet is low in fruit and fiber, primarily consisting of crackers, and he drinks water . He has not tried Miralax  or Dulcolax for relief.  He has difficulty swallowing pills, which tend to get stuck in his throat, though he can swallow food without pain. He describes a sensation of a foreign body in his throat. He had a visit for chronic disease management 2 months ago.   Past Medical History:  Diagnosis Date   ADD (attention deficit disorder)    Allergy    Anxiety    Arthritis    right hip   Bipolar 1 disorder (HCC)    Blood in urine    CKD (chronic kidney disease), stage III (HCC)    Creatinine elevation    Dementia (HCC)    "early onset" (08/04/2017)   Depression     bipolar [facility]   Diabetes mellitus without complication (HCC)    Dizziness    Family history of anesthesia complication    pt is unsure , but pt father may have been difficult to arouse    Frequent falls    HCAP (healthcare-associated pneumonia) 10/31/2012   History of kidney stones    Hypertension    Hypogonadism male    Liver fatty degeneration    Lumbar radiculopathy    Microscopic hematuria    hereditary s/p Urology eval   Neuromuscular disorder (HCC)    feet neuropathy    Osteoarthritis of right hip 11/28/2011   2012 2015 s/p THR Severe  Dr Jarvis Mesa     Pleural effusion 11/02/2012   Pneumonia 10/2012   Pneumonia, organism unspecified(486) 11/02/2012   Polysubstance dependence, non-opioid, in remission (HCC)    remote   Primary osteoarthritis of left hip 05/22/2015   PTSD (post-traumatic stress disorder)    SOCIAL ANXIETY DISORDER    Schizoaffective disorder (HCC)    Substance abuse (HCC)    Suicide attempt by multiple drug overdose (HCC) 01-15-2016   Grieving his cat's death 10/19/15    Past Surgical History:  Procedure Laterality Date   BACK SURGERY     CLOSED REDUCTION METACARPAL WITH PERCUTANEOUS PINNING Right    COLONOSCOPY  03/21/2020   LUMBAR DISC SURGERY     TONSILLECTOMY     TOTAL HIP ARTHROPLASTY Right 08/16/2013   Procedure: TOTAL HIP ARTHROPLASTY ANTERIOR APPROACH;  Surgeon: Alphonzo Ask, MD;  Location: [facility] OR;  Service: Orthopedics;  Laterality: Right;   TOTAL HIP ARTHROPLASTY Left 05/22/2015   Procedure: TOTAL HIP ARTHROPLASTY ANTERIOR APPROACH;  Surgeon: Dayne Even, MD;  Location: [facility] OR;  Service: Orthopedics;  Laterality: Left;    Family History  Problem Relation Age of Onset   Diabetes Father    Cancer Mother        died of melanoma with mets   Cervical cancer Sister    Diabetes Sister    Other Neg Hx        hypogonadism   Colon cancer Neg Hx    Colon polyps Neg Hx    Esophageal cancer Neg Hx    Rectal cancer Neg Hx     Stomach cancer Neg Hx     Social History   Socioeconomic History   Marital status: Single    Spouse name: Not on file   Number of children: 0   Years of education: 14   Highest education level: Some college, no degree  Occupational History   Occupation: disability  Tobacco Use   Smoking status: Former    Current packs/day: 0.00    Types: Cigarettes    Start date: 12/21/2018    Quit date: 03/22/2019    Years since quitting: 4.5   Smokeless tobacco: Never  Vaping Use   Vaping status: Never Used  Substance and Sexual Activity   Alcohol  use: Not Currently   Drug use: Not Currently    Types: Marijuana, "Crack" cocaine, Cocaine    Comment: hx of marijuana/cocaine/crack use but sober since 20's   Sexual activity: Not Currently  Other Topics Concern   Not on file  Social History Narrative   05/08/2021 lives alone, sister Burdette Carolin helps with meds, he has some in home care, lived with sister until Nov 2020   Caffeine- sodas, amount  varies   regular exercise-no   Social Drivers of Health   Financial Resource Strain: Low Risk  (07/27/2023)   Overall Financial Resource Strain (CARDIA)    Difficulty of Paying Living Expenses: Not very hard  Food Insecurity: Food Insecurity Present (07/27/2023)   Hunger Vital Sign    Worried About Running Out of Food in the Last Year: Sometimes true    Ran Out of Food in the Last Year: Never true  Transportation Needs: Unmet Transportation Needs (07/27/2023)   PRAPARE - Administrator, Civil Service (Medical): Yes    Lack of Transportation (Non-Medical): No  Physical Activity: Inactive (07/27/2023)   Exercise Vital Sign    Days of Exercise per Week: 0 days    Minutes of Exercise per Session: 0 min  Stress: Stress Concern Present (07/27/2023)   [employer] of Occupational Health - Occupational Stress Questionnaire    Feeling of Stress : Very much  Social Connections: Socially Isolated (07/27/2023)   Social Connection and Isolation Panel [NHANES]     Frequency of Communication with Friends and Family: Once a week    Frequency of Social Gatherings with Friends and Family: Never    Attends Religious Services: Never    Database administrator or Organizations: No    Attends Engineer, structural: Never    Marital Status: Never married    No Known Allergies  Outpatient Medications Prior to Visit  Medication Sig Dispense Refill   acetaminophen  (TYLENOL ) 500 MG tablet Take 1 tablet (500 mg total) by mouth every 8 (eight) hours as needed. 90 tablet 1   alfuzosin  (UROXATRAL ) 10 MG 24 hr tablet Take 1  tablet (10 mg total) by mouth daily with breakfast. 90 tablet 1   ARIPiprazole  (ABILIFY ) 10 MG tablet Take 1 tablet (10 mg total) by mouth daily. 30 tablet 0   atorvastatin  (LIPITOR) 10 MG tablet TAKE 1 TABLET EVERY DAY 90 tablet 3   buPROPion  (WELLBUTRIN  XL) 300 MG 24 hr tablet SMARTSIG:1.0 Tablet(s) By Mouth Daily     clonazePAM  (KLONOPIN ) 0.5 MG tablet Take by mouth.     Continuous Blood Gluc Receiver (FREESTYLE LIBRE READER) DEVI Use as directed 1 each each   DROPLET PEN NEEDLES 31G X 5 MM MISC USE 4 TIMES DAILY 400 each 3   Dulaglutide  (TRULICITY ) 3 MG/0.5ML SOAJ INJECT 3 MG INTO THE SKIN ONCE A WEEK. 6 mL 3   fenofibrate  (TRICOR ) 48 MG tablet Take 1 tablet (48 mg total) by mouth daily. 90 tablet 3   fluticasone  (FLONASE ) 50 MCG/ACT nasal spray USE 1 SPRAY IN EACH NOSTRIL EVERY DAY 32 g 2   glucose blood (ACCU-CHEK GUIDE) test strip CHECK SUGAR FOUR TIMES DAILY 100 each 12   HUMALOG  KWIKPEN 200 UNIT/ML KwikPen INJECT 15-20 UNITS INTO THE SKIN 3 TIMES DAILY BEFORE MEALS. (DISCARD PEN 28 DAYS AFTER OPENING) 30 mL 3   insulin  glargine (LANTUS  SOLOSTAR) 100 UNIT/ML Solostar Pen Inject 40 Units into the skin daily. 45 mL 3   losartan  (COZAAR ) 25 MG tablet Take 1 tablet (25 mg total) by mouth daily. 90 tablet 1   Multiple Vitamins-Minerals (MULTIVITAMIN WITH MINERALS) tablet Take 1 tablet by mouth daily.     pantoprazole  (PROTONIX ) 40  MG tablet Take 1 tablet (40 mg total) by mouth daily. 90 tablet 1   traZODone  (DESYREL ) 100 MG tablet Take 200 mg by mouth at bedtime as needed.     No facility-administered medications prior to visit.     ROS Review of Systems  Constitutional:  Negative for activity change and appetite change.  HENT:  Negative for sinus pressure and sore throat.   Respiratory:  Negative for chest tightness, shortness of breath and wheezing.   Cardiovascular:  Negative for chest pain and palpitations.  Gastrointestinal:  Positive for constipation. Negative for abdominal distention and abdominal pain.  Genitourinary: Negative.   Musculoskeletal:  Positive for back pain.  Psychiatric/Behavioral:  Positive for dysphoric mood. Negative for behavioral problems.     Objective:  BP 135/80   Pulse 81   Ht 6\' 4"  (1.93 m)   Wt 228 lb (103.4 kg)   BMI 27.75 kg/m      09/24/2023   10:34 AM 08/03/2023    2:28 PM 07/28/2023   10:56 AM  BP/Weight  Systolic BP 135 124 132  Diastolic BP 80 80 82  Wt. (Lbs) 228 217.6 220.6  BMI 27.75 kg/m2 26.49 kg/m2 26.85 kg/m2      Physical Exam Constitutional:      Appearance: He is well-developed.  Cardiovascular:     Rate and Rhythm: Normal rate.     Heart sounds: Normal heart sounds. No murmur heard. Pulmonary:     Effort: Pulmonary effort is normal.     Breath sounds: Normal breath sounds. No wheezing or rales.  Chest:     Chest wall: No tenderness.  Abdominal:     General: Bowel sounds are normal. There is no distension.     Palpations: Abdomen is soft. There is no mass.     Tenderness: There is no abdominal tenderness.  Musculoskeletal:        General: Normal range of motion.  Right lower leg: No edema.     Left lower leg: No edema.  Neurological:     Mental Status: He is alert and oriented to person, place, and time.  Psychiatric:        Mood and Affect: Mood normal.        Latest Ref Rng & Units 07/28/2023   11:56 AM 03/26/2023   11:36 AM  02/10/2022    3:52 PM  CMP  Glucose 70 - 99 mg/dL 161  096  045   BUN 6 - 24 mg/dL 21  19  16    Creatinine 0.76 - 1.27 mg/dL 4.09  8.11  9.14   Sodium 134 - 144 mmol/L 140  138  143   Potassium 3.5 - 5.2 mmol/L 5.1  4.7  5.2   Chloride 96 - 106 mmol/L 100  98  105   CO2 20 - 29 mmol/L 25  23  24    Calcium  8.7 - 10.2 mg/dL 9.9  9.7  78.2   Total Protein 6.0 - 8.5 g/dL 7.0  7.6  7.3   Total Bilirubin 0.0 - 1.2 mg/dL 0.4  0.3  0.3   Alkaline Phos 44 - 121 IU/L 80  87  73   AST 0 - 40 IU/L 28  29  20    ALT 0 - 44 IU/L 25  26  23      Lipid Panel     Component Value Date/Time   CHOL 139 07/16/2022 1144   CHOL 167 07/06/2020 1116   TRIG 285.0 (H) 07/16/2022 1144   HDL 43.20 07/16/2022 1144   HDL 39 (L) 07/06/2020 1116   CHOLHDL 3 07/16/2022 1144   VLDL 57.0 (H) 07/16/2022 1144   LDLCALC 76 07/06/2020 1116   LDLDIRECT 62.0 07/16/2022 1144    CBC    Component Value Date/Time   WBC 7.9 02/10/2022 1552   WBC 3.7 (L) 01/02/2022 0426   RBC 5.20 02/10/2022 1552   RBC 4.36 01/02/2022 0426   HGB 15.3 02/10/2022 1552   HCT 45.6 02/10/2022 1552   PLT 177 02/10/2022 1552   MCV 88 02/10/2022 1552   MCH 29.4 02/10/2022 1552   MCH 28.7 01/02/2022 0426   MCHC 33.6 02/10/2022 1552   MCHC 33.4 01/02/2022 0426   RDW 13.3 02/10/2022 1552   LYMPHSABS 1.3 12/15/2021 0659   LYMPHSABS 1.7 11/19/2021 1640   MONOABS 0.5 12/15/2021 0659   EOSABS 0.1 12/15/2021 0659   EOSABS 0.0 11/19/2021 1640   BASOSABS 0.0 12/15/2021 0659   BASOSABS 0.0 11/19/2021 1640    Lab Results  Component Value Date   HGBA1C 6.1 (A) 08/03/2023      1. Other dysphagia (Primary) He has a sense of his pills being stuck in his throat Has previously undergone esophageal dilatation - Ambulatory referral to Gastroenterology  2. Other constipation Counseled on increasing fiber intake, fruits and vegetable, limit intake of foods like cheese, white bread, white rice - polyethylene glycol powder (GLYCOLAX /MIRALAX ) 17  GM/SCOOP powder; Take 17 g by mouth daily.  Dispense: 3350 g; Refill: 1  3. Ataxia Ambulates with a cane Handicap form completed  4. Globus sensation - Ambulatory referral to Gastroenterology  5. Schizoaffective disorder, bipolar type (HCC) Uncontrolled anxiety and depression Currently under the care of psychiatry He will benefit from more frequent therapy sessions and has been advised to discuss this with his therapist so he can be transferred to someone who can see him frequently  6. Hyperlipidemia due to type 2 diabetes mellitus (  HCC) Controlled - LP+Non-HDL Cholesterol   Meds ordered this encounter  Medications   polyethylene glycol powder (GLYCOLAX /MIRALAX ) 17 GM/SCOOP powder    Sig: Take 17 g by mouth daily.    Dispense:  3350 g    Refill:  1    Follow-up: Return in about 6 months (around 03/25/2024) for cancel previous appointment.       Joaquin Mulberry, MD, FAAFP. [facility] [place], [place] [phone]   09/24/2023, 1:44 PM

## 2023-09-28 ENCOUNTER — Other Ambulatory Visit: Payer: Self-pay

## 2023-09-28 ENCOUNTER — Encounter: Payer: Self-pay | Admitting: Family Medicine

## 2023-09-28 LAB — LP+NON-HDL CHOLESTEROL
Cholesterol, Total: 121 mg/dL (ref 100–199)
HDL: 35 mg/dL — ABNORMAL LOW (ref >39)
LDL Chol Calc (NIH): 53 mg/dL (ref 0–99)
Total Non-HDL-Chol (LDL+VLDL): 86 mg/dL (ref 0–129)
Triglycerides: 199 mg/dL — ABNORMAL HIGH (ref 0–149)
VLDL Cholesterol Cal: 33 mg/dL (ref 5–40)

## 2023-09-29 ENCOUNTER — Ambulatory Visit: Payer: Self-pay | Admitting: Family Medicine

## 2023-09-30 DIAGNOSIS — F4010 Social phobia, unspecified: Secondary | ICD-10-CM | POA: Diagnosis not present

## 2023-10-03 DIAGNOSIS — E1122 Type 2 diabetes mellitus with diabetic chronic kidney disease: Secondary | ICD-10-CM | POA: Diagnosis not present

## 2023-10-07 ENCOUNTER — Ambulatory Visit (INDEPENDENT_AMBULATORY_CARE_PROVIDER_SITE_OTHER): Admitting: Podiatry

## 2023-10-07 ENCOUNTER — Encounter: Payer: Self-pay | Admitting: Podiatry

## 2023-10-07 DIAGNOSIS — E1142 Type 2 diabetes mellitus with diabetic polyneuropathy: Secondary | ICD-10-CM

## 2023-10-07 DIAGNOSIS — M79674 Pain in right toe(s): Secondary | ICD-10-CM | POA: Diagnosis not present

## 2023-10-07 DIAGNOSIS — M79675 Pain in left toe(s): Secondary | ICD-10-CM

## 2023-10-07 DIAGNOSIS — B351 Tinea unguium: Secondary | ICD-10-CM

## 2023-10-08 DIAGNOSIS — Z0100 Encounter for examination of eyes and vision without abnormal findings: Secondary | ICD-10-CM | POA: Diagnosis not present

## 2023-10-08 DIAGNOSIS — E119 Type 2 diabetes mellitus without complications: Secondary | ICD-10-CM | POA: Diagnosis not present

## 2023-10-08 LAB — HM DIABETES EYE EXAM

## 2023-10-09 DIAGNOSIS — M47816 Spondylosis without myelopathy or radiculopathy, lumbar region: Secondary | ICD-10-CM | POA: Diagnosis not present

## 2023-10-09 DIAGNOSIS — Z6828 Body mass index (BMI) 28.0-28.9, adult: Secondary | ICD-10-CM | POA: Diagnosis not present

## 2023-10-09 DIAGNOSIS — M5416 Radiculopathy, lumbar region: Secondary | ICD-10-CM | POA: Diagnosis not present

## 2023-10-12 ENCOUNTER — Encounter: Payer: Self-pay | Admitting: Podiatry

## 2023-10-12 DIAGNOSIS — N1832 Chronic kidney disease, stage 3b: Secondary | ICD-10-CM | POA: Diagnosis not present

## 2023-10-12 NOTE — Progress Notes (Signed)
  Subjective:  Patient ID: Jonathan Waddell Sherrine Mickey., male    DOB: 05-30-1965,  MRN: [id number]  Jonathan Waddell Marquelle Musgrave. presents to clinic today for at risk foot care with history of diabetic neuropathy and painful thick toenails that are difficult to trim. Pain interferes with ambulation. Aggravating factors include wearing enclosed shoe gear. Pain is relieved with periodic professional debridement.  Chief Complaint  Patient presents with   Dfc    Rm16 /dfc/A1c 6/blood sugar 90/ Dr. Newlin last visit June 2025   New problem(s): None.   PCP is Delbert Clam, MD.  Allergies  Allergen Reactions   Egg Solids, Whole Nausea Only   Hydrocodone  Rash    Review of Systems: Negative except as noted in the HPI.  Objective: No changes noted in today's physical examination. There were no vitals filed for this visit. Jonathan Waddell Shant Hence. is a pleasant 58 y.o. male WD, WN in NAD. AAO x 3.  Vascular Examination: Capillary refill time immediate b/l. Palpable pedal pulses. No pain with calf compression b/l. Skin temperature gradient WNL b/l. No cyanosis or clubbing b/l. No ischemia or gangrene noted b/l. Pedal hair sparse. No edema noted b/l LE.  Neurological Examination: Sensation grossly intact b/l with 10 gram monofilament. Vibratory sensation intact b/l. Pt has subjective symptoms of neuropathy.  Dermatological Examination: Pedal skin with normal turgor, texture and tone b/l.  No open wounds. No interdigital macerations.   Toenails 1-5 b/l thick, discolored, elongated with subungual debris and pain on dorsal palpation.   No corns, calluses nor porokeratotic lesions noted.  Musculoskeletal Examination: Muscle strength 5/5 to all lower extremity muscle groups bilaterally. Hammertoe(s) 2-5 b/l. Utilizes rollator for ambulation assistance.  Radiographs: None  Last A1c:      Latest Ref Rng & Units 08/03/2023    2:47 PM 04/03/2023    2:17 PM 12/05/2022   12:00 AM  Hemoglobin A1C   Hemoglobin-A1c 4.0 - 5.6 % 6.1  7.9  7.0         This result is from an external source.   Assessment/Plan: 1. Pain due to onychomycosis of toenails of both feet   2. Diabetic peripheral neuropathy associated with type 2 diabetes mellitus (HCC)   Patient was evaluated and treated. All patient's and/or POA's questions/concerns addressed on today's visit. Toenails 1-5 debrided in length and girth without incident. Continue foot and shoe inspections daily. Monitor blood glucose per PCP/Endocrinologist's recommendations. Continue soft, supportive shoe gear daily. Report any pedal injuries to medical professional. Call office if there are any questions/concerns. -Patient/POA to call should there be question/concern in the interim.   Return in about 3 months (around 01/07/2024).  Delon LITTIE Merlin, DPM      [facility] LOCATION: [address] [place] [address]                   Office [phone]   [facility] LOCATION: [address] [place], [place] [address] Office [phone]

## 2023-10-19 DIAGNOSIS — I129 Hypertensive chronic kidney disease with stage 1 through stage 4 chronic kidney disease, or unspecified chronic kidney disease: Secondary | ICD-10-CM | POA: Diagnosis not present

## 2023-10-19 DIAGNOSIS — N1832 Chronic kidney disease, stage 3b: Secondary | ICD-10-CM | POA: Diagnosis not present

## 2023-10-19 DIAGNOSIS — E1122 Type 2 diabetes mellitus with diabetic chronic kidney disease: Secondary | ICD-10-CM | POA: Diagnosis not present

## 2023-10-19 DIAGNOSIS — D631 Anemia in chronic kidney disease: Secondary | ICD-10-CM | POA: Diagnosis not present

## 2023-10-19 DIAGNOSIS — F319 Bipolar disorder, unspecified: Secondary | ICD-10-CM | POA: Diagnosis not present

## 2023-10-21 DIAGNOSIS — F4010 Social phobia, unspecified: Secondary | ICD-10-CM | POA: Diagnosis not present

## 2023-11-02 DIAGNOSIS — E1122 Type 2 diabetes mellitus with diabetic chronic kidney disease: Secondary | ICD-10-CM | POA: Diagnosis not present

## 2023-11-04 ENCOUNTER — Ambulatory Visit: Payer: Medicare HMO | Attending: Family Medicine

## 2023-11-04 VITALS — Ht 76.0 in | Wt 230.0 lb

## 2023-11-04 DIAGNOSIS — Z0000 Encounter for general adult medical examination without abnormal findings: Secondary | ICD-10-CM | POA: Diagnosis not present

## 2023-11-04 DIAGNOSIS — F4010 Social phobia, unspecified: Secondary | ICD-10-CM | POA: Diagnosis not present

## 2023-11-04 DIAGNOSIS — Z87891 Personal history of nicotine dependence: Secondary | ICD-10-CM

## 2023-11-04 NOTE — Patient Instructions (Signed)
 [name] , Thank you for taking time out of your busy schedule to complete your Annual Wellness Visit with me. I enjoyed our conversation and look forward to speaking with you again next year. I, as well as your care team,  appreciate your ongoing commitment to your health goals. Please review the following plan we discussed and let me know if I can assist you in the future. Your Game plan/ To Do List    Referrals: If you haven't heard from the office you've been referred to, please reach out to them at the phone provided.   1. Lung Cancer Screening  [facility] [address] [place],      [place], [place] [address] [phone] (Office will call you to schedule)   Follow up Visits: Next Medicare AWV with our clinical staff: 11/08/2024 at 2:10 p.m. phone visit with Nurse    Have you seen your provider in the last 6 months (3 months if uncontrolled diabetes)? Yes Next Office Visit with your provider: 03/28/2024 at 10:10 a.m. office visit with Dr. Delbert  Clinician Recommendations:  Aim for 30 minutes of exercise or brisk walking, 6-8 glasses of water , and 5 servings of fruits and vegetables each day.       This is a list of the screening recommended for you and due dates:  Health Maintenance  Topic Date Due   Hepatitis B Vaccine (1 of 3 - 19+ 3-dose series) Never done   Screening for Lung Cancer  12/14/2017   COVID-19 Vaccine (5 - 2024-25 season) 12/21/2022   Zoster (Shingles) Vaccine (1 of 2) 12/25/2023*   Flu Shot  11/20/2023   Hemoglobin A1C  02/02/2024   Yearly kidney health urinalysis for diabetes  03/25/2024   Complete foot exam   06/16/2024   Yearly kidney function blood test for diabetes  07/27/2024   Eye exam for diabetics  10/07/2024   Medicare Annual Wellness Visit  11/03/2024   DTaP/Tdap/Td vaccine (2 - Td or Tdap) 11/29/2024   Colon Cancer Screening  03/29/2026   Pneumococcal Vaccination  Completed   Hepatitis C Screening  Completed   HIV Screening  Completed   HPV  Vaccine  Aged Out   Meningitis B Vaccine  Aged Out  *Topic was postponed. The date shown is not the original due date.    Advanced directives: (In Chart) A copy of your advanced directives are scanned into your chart should your provider ever need it. Advance Care Planning is important because it:  [x]  Makes sure you receive the medical care that is consistent with your values, goals, and preferences  [x]  It provides guidance to your family and loved ones and reduces their decisional burden about whether or not they are making the right decisions based on your wishes.  Follow the link provided in your after visit summary or read over the paperwork we have mailed to you to help you started getting your Advance Directives in place. If you need assistance in completing these, please reach out to us  so that we can help you!  See attachments for Preventive Care and Fall Prevention Tips.

## 2023-11-04 NOTE — Progress Notes (Signed)
 Because this visit was a virtual/telehealth visit,  certain criteria was not obtained, such a blood pressure, CBG if applicable, and timed get up and go. Any medications not marked as taking were not mentioned during the medication reconciliation part of the visit. Any vitals not documented were not able to be obtained due to this being a telehealth visit or patient was unable to self-report a recent blood pressure reading due to a lack of equipment at home via telehealth. Vitals that have been documented are verbally provided by the patient.   Subjective:   [name]. is a 58 y.o. who presents for a Medicare Wellness preventive visit.  As a reminder, Annual Wellness Visits don't include a physical exam, and some assessments may be limited, especially if this visit is performed virtually. We may recommend an in-person follow-up visit with your provider if needed.  Visit Complete: Virtual I connected with  [name]. on 11/04/23 by a audio enabled telemedicine application and verified that I am speaking with the correct person using two identifiers.  Patient Location: Home  Provider Location: Home Office  I discussed the limitations of evaluation and management by telemedicine. The patient expressed understanding and agreed to proceed.  Vital Signs: Because this visit was a virtual/telehealth visit, some criteria may be missing or patient reported. Any vitals not documented were not able to be obtained and vitals that have been documented are patient reported.  VideoDeclined- This patient declined Librarian, academic. Therefore the visit was completed with audio only.  Persons Participating in Visit: Patient.  AWV Questionnaire: Yes: Patient Medicare AWV questionnaire was completed by the patient on 10/31/23; I have confirmed that all information answered by patient is correct and no changes since this date.  Cardiac Risk Factors include:  advanced age (>80men, >15 women);diabetes mellitus;dyslipidemia;family history of premature cardiovascular disease;hypertension;male gender;sedentary lifestyle     Objective:    Today's Vitals   11/04/23 1407  Weight: 230 lb (104.3 kg)  Height: 6' 4 (1.93 m)  PainSc: 8   PainLoc: Back   Body mass index is 28 kg/m.     11/04/2023    2:12 PM 10/29/2022    1:23 PM 01/31/2022   12:08 PM 12/27/2021    7:40 AM 12/02/2021    4:00 PM 12/01/2021    5:37 PM 06/16/2021   11:37 AM  Advanced Directives  Does Patient Have a Medical Advance Directive? Yes No Yes Unable to assess, patient is non-responsive or altered mental status Unable to assess, patient is non-responsive or altered mental status Unable to assess, patient is non-responsive or altered mental status No  Type of Advance Directive Healthcare Power of [place];Living will  Healthcare Power of [place];Living will      Does patient want to make changes to medical advance directive? No - Patient declined        Copy of Healthcare Power of Attorney in Chart? Yes - validated most recent copy scanned in chart (See row information)        Would patient like information on creating a medical advance directive?  Yes (MAU/Ambulatory/Procedural Areas - Information given)     No - Patient declined    Current Medications (verified) Outpatient Encounter Medications as of 11/04/2023  Medication Sig   acetaminophen  (TYLENOL ) 500 MG tablet Take 1 tablet (500 mg total) by mouth every 8 (eight) hours as needed.   alfuzosin  (UROXATRAL ) 10 MG 24 hr tablet Take 1 tablet (10 mg total) by  mouth daily with breakfast.   ARIPiprazole  (ABILIFY ) 10 MG tablet Take 1 tablet (10 mg total) by mouth daily.   atorvastatin  (LIPITOR) 10 MG tablet TAKE 1 TABLET EVERY DAY   buPROPion  (WELLBUTRIN  XL) 300 MG 24 hr tablet SMARTSIG:1.0 Tablet(s) By Mouth Daily   clonazePAM  (KLONOPIN ) 0.5 MG tablet Take by mouth.   Continuous Blood Gluc Receiver (FREESTYLE LIBRE READER) DEVI  Use as directed   DROPLET PEN NEEDLES 31G X 5 MM MISC USE 4 TIMES DAILY   Dulaglutide  (TRULICITY ) 3 MG/0.5ML SOAJ INJECT 3 MG INTO THE SKIN ONCE A WEEK.   fenofibrate  (TRICOR ) 48 MG tablet Take 1 tablet (48 mg total) by mouth daily.   fluticasone  (FLONASE ) 50 MCG/ACT nasal spray USE 1 SPRAY IN EACH NOSTRIL EVERY DAY   glucose blood (ACCU-CHEK GUIDE) test strip CHECK SUGAR FOUR TIMES DAILY   HUMALOG  KWIKPEN 200 UNIT/ML KwikPen INJECT 15-20 UNITS INTO THE SKIN 3 TIMES DAILY BEFORE MEALS. (DISCARD PEN 28 DAYS AFTER OPENING)   insulin  glargine (LANTUS  SOLOSTAR) 100 UNIT/ML Solostar Pen Inject 40 Units into the skin daily.   losartan  (COZAAR ) 25 MG tablet Take 1 tablet (25 mg total) by mouth daily.   Multiple Vitamins-Minerals (MULTIVITAMIN WITH MINERALS) tablet Take 1 tablet by mouth daily.   pantoprazole  (PROTONIX ) 40 MG tablet Take 1 tablet (40 mg total) by mouth daily.   polyethylene glycol powder (GLYCOLAX /MIRALAX ) 17 GM/SCOOP powder Take 17 g by mouth daily.   traZODone  (DESYREL ) 100 MG tablet Take 200 mg by mouth at bedtime as needed.   No facility-administered encounter medications on file as of 11/04/2023.    Allergies (verified) Egg solids, whole and Hydrocodone    History: Past Medical History:  Diagnosis Date   ADD (attention deficit disorder)    Allergy    Anxiety    Arthritis    right hip   Bipolar 1 disorder (HCC)    Blood in urine    CKD (chronic kidney disease), stage III (HCC)    Creatinine elevation    Dementia (HCC)    early onset (08/04/2017)   Depression    bipolar [facility]   Diabetes mellitus without complication (HCC)    Dizziness    Family history of anesthesia complication    pt is unsure , but pt father may have been difficult to arouse    Frequent falls    HCAP (healthcare-associated pneumonia) 10/31/2012   History of kidney stones    Hypertension    Hypogonadism male    Liver fatty degeneration    Lumbar radiculopathy    Microscopic  hematuria    hereditary s/p Urology eval   Neuromuscular disorder (HCC)    feet neuropathy    Osteoarthritis of right hip 11/28/2011   2012 2015 s/p THR Severe  Dr Loa     Pleural effusion 11/02/2012   Pneumonia 23-Nov-2012   Pneumonia, organism unspecified(486) 11/02/2012   Polysubstance dependence, non-opioid, in remission (HCC)    remote   Primary osteoarthritis of left hip 05/22/2015   PTSD (post-traumatic stress disorder)    SOCIAL ANXIETY DISORDER    Schizoaffective disorder (HCC)    Substance abuse (HCC)    Suicide attempt by multiple drug overdose (HCC) 01-10-16   Grieving his cat's death 2015-11-24   Past Surgical History:  Procedure Laterality Date   BACK SURGERY     CLOSED REDUCTION METACARPAL WITH PERCUTANEOUS PINNING Right    COLONOSCOPY  03/21/2020   LUMBAR DISC SURGERY     TONSILLECTOMY  TOTAL HIP ARTHROPLASTY Right 08/16/2013   Procedure: TOTAL HIP ARTHROPLASTY ANTERIOR APPROACH;  Surgeon: Maude KANDICE Herald, MD;  Location: [facility] OR;  Service: Orthopedics;  Laterality: Right;   TOTAL HIP ARTHROPLASTY Left 05/22/2015   Procedure: TOTAL HIP ARTHROPLASTY ANTERIOR APPROACH;  Surgeon: Maude Herald, MD;  Location: [facility] OR;  Service: Orthopedics;  Laterality: Left;   Family History  Problem Relation Age of Onset   Diabetes Father    Cancer Mother        died of melanoma with mets   Cervical cancer Sister    Diabetes Sister    Other Neg Hx        hypogonadism   Colon cancer Neg Hx    Colon polyps Neg Hx    Esophageal cancer Neg Hx    Rectal cancer Neg Hx    Stomach cancer Neg Hx    Social History   Socioeconomic History   Marital status: Single    Spouse name: Not on file   Number of children: 0   Years of education: 14   Highest education level: Some college, no degree  Occupational History   Occupation: disability  Tobacco Use   Smoking status: Former    Current packs/day: 0.00    Types: Cigarettes    Start date: 12/21/2018    Quit date: 03/22/2019     Years since quitting: 4.6   Smokeless tobacco: Never  Vaping Use   Vaping status: Never Used  Substance and Sexual Activity   Alcohol  use: Not Currently   Drug use: Not Currently    Types: Marijuana, Crack cocaine, Cocaine    Comment: hx of marijuana/cocaine/crack use but sober since 20's   Sexual activity: Not Currently  Other Topics Concern   Not on file  Social History Narrative   05/08/2021 lives alone, sister Luke helps with meds, he has some in home care, lived with sister until Nov 2020   Caffeine- sodas, amount  varies   regular exercise-no   Social Drivers of Health   Financial Resource Strain: Low Risk  (11/04/2023)   Overall Financial Resource Strain (CARDIA)    Difficulty of Paying Living Expenses: Not hard at all  Food Insecurity: No Food Insecurity (11/04/2023)   Hunger Vital Sign    Worried About Running Out of Food in the Last Year: Never true    Ran Out of Food in the Last Year: Never true  Transportation Needs: No Transportation Needs (11/04/2023)   PRAPARE - Administrator, Civil Service (Medical): No    Lack of Transportation (Non-Medical): No  Physical Activity: Inactive (11/04/2023)   Exercise Vital Sign    Days of Exercise per Week: 0 days    Minutes of Exercise per Session: 0 min  Stress: Stress Concern Present (11/04/2023)   [employer] of Occupational Health - Occupational Stress Questionnaire    Feeling of Stress: Very much  Social Connections: Moderately Isolated (11/04/2023)   Social Connection and Isolation Panel    Frequency of Communication with Friends and Family: Once a week    Frequency of Social Gatherings with Friends and Family: Twice a week    Attends Religious Services: Never    Database administrator or Organizations: Yes    Attends Engineer, structural: 1 to 4 times per year    Marital Status: Never married    Tobacco Counseling Counseling given: Not Answered    Clinical Intake:  Pre-visit preparation  completed: Yes  Pain : 0-10 Pain  Score: 8  Pain Type: Chronic pain Pain Location: Back Pain Orientation: Lower Pain Descriptors / Indicators: Constant, Throbbing, Other (Comment) (GRINDING) Pain Onset: More than a month ago Pain Frequency: Constant     BMI - recorded: 28 Nutritional Status: BMI 25 -29 Overweight Nutritional Risks: None Diabetes: Yes CBG done?: No Did pt. bring in CBG monitor from home?: No  Lab Results  Component Value Date   HGBA1C 6.1 (A) 08/03/2023   HGBA1C 7.9 (A) 04/03/2023   HGBA1C 7.0 12/05/2022     How often do you need to have someone help you when you read instructions, pamphlets, or other written materials from your doctor or pharmacy?: 1 - Never What is the last grade level you completed in school?: HSG  Interpreter Needed?: No  Information entered by :: Roz Fuller, LPN.   Activities of Daily Living     11/04/2023    2:23 PM 10/31/2023    8:49 AM  In your present state of health, do you have any difficulty performing the following activities:  Hearing? 0 0  Vision? 0 0  Difficulty concentrating or making decisions? 1 1  Walking or climbing stairs? 1 1  Dressing or bathing? 1 1  Doing errands, shopping? 0 0  Preparing Food and eating ? N N  Using the Toilet? N N  In the past six months, have you accidently leaked urine? N N  Do you have problems with loss of bowel control? N N  Managing your Medications? Y Y  Managing your Finances? N N  Housekeeping or managing your Housekeeping? CINDERELLA CINDERELLA    Patient Care Team: Delbert Clam, MD as PCP - General (Family Medicine) Unice Pac, MD as Attending Physician (Neurosurgery) Gaynel Delon CROME, DPM as Consulting Physician (Podiatry) Birda Carlin DELENA DEVONNA (Urology) Joshua Cough, LCSW as Counselor Trixie File, MD as Consulting Physician (Internal Medicine) Joshua Rush, OD as Consulting Physician (Optometry)  I have updated your Care Teams any recent Medical Services you may  have received from other providers in the past year.     Assessment:   This is a routine wellness examination for [name].  Hearing/Vision screen Hearing Screening - Comments:: Denies hearing difficulties.  Vision Screening - Comments:: Wears rx glasses - up to date with routine eye exams with [facility]    Goals Addressed             This Visit's Progress    11/04/23: Patient stated no goals at this time.         Depression Screen     11/04/2023    2:09 PM 09/24/2023   11:02 AM 07/28/2023   10:57 AM 10/29/2022    1:21 PM 09/18/2022   11:04 AM 04/18/2022   10:51 AM 01/31/2022   12:08 PM  PHQ 2/9 Scores  PHQ - 2 Score 3 6 6 6 6 6  0  PHQ- 9 Score 8 21 20 20 20 22      Fall Risk     11/04/2023    2:09 PM 10/31/2023    8:49 AM 09/24/2023   11:02 AM 07/28/2023   10:57 AM 03/26/2023   10:47 AM  Fall Risk   Falls in the past year? 0 0 1 1 0  Number falls in past yr: 0 0 0 0 0  Injury with Fall? 0 0 0 0 0  Risk for fall due to : History of fall(s);Impaired balance/gait  Impaired balance/gait Impaired balance/gait No Fall Risks  Follow up Falls evaluation completed;Education  provided  Falls evaluation completed Falls evaluation completed Falls evaluation completed    MEDICARE RISK AT HOME:  Medicare Risk at Home Any stairs in or around the home?: Yes If so, are there any without handrails?: No Home free of loose throw rugs in walkways, pet beds, electrical cords, etc?: Yes Adequate lighting in your home to reduce risk of falls?: Yes Life alert?: No Use of a cane, walker or w/c?: Yes Grab bars in the bathroom?: Yes Shower chair or bench in shower?: Yes Elevated toilet seat or a handicapped toilet?: No  TIMED UP AND GO:  Was the test performed?  No  Cognitive Function: Impaired: Patient has current diagnosis of cognitive impairment.    11/04/2023    2:24 PM 01/31/2022   12:10 PM 05/08/2021    2:37 PM 09/14/2019   11:29 AM  MMSE - Mini Mental State Exam   Not completed: Unable to complete     Orientation to time  5 3 5   Orientation to Place  5 4 4   Registration  3 3 3   Attention/ Calculation  5 0 0  Recall  3 2 2   Language- name 2 objects  2 2 2   Language- repeat  1 1 1   Language- follow 3 step command  3 3 3   Language- read & follow direction  1 1 1   Write a sentence  1 1 1   Copy design  1 1 0  Total score  30 21 22       03/19/2017    1:20 PM  Montreal Cognitive Assessment   Visuospatial/ Executive (0/5) 3  Naming (0/3) 2  Attention: Read list of digits (0/2) 1  Attention: Read list of letters (0/1) 1  Attention: Serial 7 subtraction starting at 100 (0/3) 1  Language: Repeat phrase (0/2) 0  Language : Fluency (0/1) 0  Abstraction (0/2) 1  Delayed Recall (0/5) 1  Orientation (0/6) 6  Total 16  Adjusted Score (based on education) 16      10/29/2022    1:24 PM 01/31/2022   12:11 PM  6CIT Screen  What Year? 0 points 0 points  What month? 0 points 0 points  What time? 0 points 0 points  Count back from 20 0 points 0 points  Months in reverse 2 points 2 points  Repeat phrase 0 points 0 points  Total Score 2 points 2 points    Immunizations Immunization History  Administered Date(s) Administered   Influenza Split 02/17/2011, 04/04/2020   Influenza Whole 03/07/2004, 01/18/2010   Influenza, Seasonal, Injecte, Preservative Fre 06/10/2012, 03/26/2023   Influenza,inj,Quad PF,6+ Mos 06/13/2013, 01/01/2015, 01/30/2019, 05/27/2021, 02/10/2022   Influenza,inj,quad, With Preservative 01/09/2016   Influenza-Unspecified 01/04/2014, 01/10/2016   Moderna Sars-Covid-2 Vaccination 06/01/2019, 06/29/2019, 01/29/2021   PFIZER Comirnaty(Gray Top)Covid-19 Tri-Sucrose Vaccine 04/04/2020   PNEUMOCOCCAL CONJUGATE-20 07/28/2023   Pneumococcal Conjugate-13 04/18/2015   Pneumococcal Polysaccharide-23 10/31/2012, 11/19/2020   Tdap 11/30/2014    Screening Tests Health Maintenance  Topic Date Due   Hepatitis B Vaccines (1 of 3 - 19+  3-dose series) Never done   Lung Cancer Screening  12/14/2017   COVID-19 Vaccine (5 - 2024-25 season) 12/21/2022   Zoster Vaccines- Shingrix (1 of 2) 12/25/2023 (Originally 12/15/1984)   INFLUENZA VACCINE  11/20/2023   HEMOGLOBIN A1C  02/02/2024   Diabetic kidney evaluation - Urine ACR  03/25/2024   FOOT EXAM  06/16/2024   Diabetic kidney evaluation - eGFR measurement  07/27/2024   OPHTHALMOLOGY EXAM  10/07/2024   Medicare Annual  Wellness (AWV)  11/03/2024   DTaP/Tdap/Td (2 - Td or Tdap) 11/29/2024   Colonoscopy  03/29/2026   Pneumococcal Vaccine 22-74 Years old  Completed   Hepatitis C Screening  Completed   HIV Screening  Completed   HPV VACCINES  Aged Out   Meningococcal B Vaccine  Aged Out    Health Maintenance  Health Maintenance Due  Topic Date Due   Hepatitis B Vaccines (1 of 3 - 19+ 3-dose series) Never done   Lung Cancer Screening  12/14/2017   COVID-19 Vaccine (5 - 2024-25 season) 12/21/2022   Health Maintenance Items Addressed: Yes, NCIR was verified and updated in patient's chart. Lung Cancer Screening ordered.  Additional Screening:  Vision Screening: Recommended annual ophthalmology exams for early detection of glaucoma and other disorders of the eye. Would you like a referral to an eye doctor? No    Dental Screening: Recommended annual dental exams for proper oral hygiene  Community Resource Referral / Chronic Care Management: CRR required this visit?  No   CCM required this visit?  No   Plan:    I have personally reviewed and noted the following in the patient's chart:   Medical and social history Use of alcohol , tobacco or illicit drugs  Current medications and supplements including opioid prescriptions. Patient is not currently taking opioid prescriptions. Functional ability and status Nutritional status Physical activity Advanced directives List of other physicians Hospitalizations, surgeries, and ER visits in previous 12  months Vitals Screenings to include cognitive, depression, and falls Referrals and appointments  In addition, I have reviewed and discussed with patient certain preventive protocols, quality metrics, and best practice recommendations. A written personalized care plan for preventive services as well as general preventive health recommendations were provided to patient.   Roz LOISE Fuller, LPN   [id number]   After Visit Summary: (MyChart) Due to this being a telephonic visit, the after visit summary with patients personalized plan was offered to patient via MyChart   Notes: NCIR was verified and documented in chart. Lung Cancer Screening ordered. Records will be requested from [facility] for last eye exam notes.

## 2023-11-05 ENCOUNTER — Encounter: Payer: Self-pay | Admitting: Family Medicine

## 2023-11-05 DIAGNOSIS — M47816 Spondylosis without myelopathy or radiculopathy, lumbar region: Secondary | ICD-10-CM | POA: Diagnosis not present

## 2023-11-10 DIAGNOSIS — F331 Major depressive disorder, recurrent, moderate: Secondary | ICD-10-CM | POA: Diagnosis not present

## 2023-11-11 ENCOUNTER — Ambulatory Visit
Admission: RE | Admit: 2023-11-11 | Discharge: 2023-11-11 | Disposition: A | Discharge status: Home / Self Care | Source: Ambulatory Visit | Attending: Family Medicine | Admitting: Family Medicine

## 2023-11-11 DIAGNOSIS — Z87891 Personal history of nicotine dependence: Secondary | ICD-10-CM | POA: Diagnosis not present

## 2023-11-11 DIAGNOSIS — Z122 Encounter for screening for malignant neoplasm of respiratory organs: Secondary | ICD-10-CM | POA: Diagnosis not present

## 2023-11-17 DIAGNOSIS — M47816 Spondylosis without myelopathy or radiculopathy, lumbar region: Secondary | ICD-10-CM | POA: Diagnosis not present

## 2023-11-18 ENCOUNTER — Encounter: Payer: Self-pay | Admitting: Family Medicine

## 2023-11-18 DIAGNOSIS — F4010 Social phobia, unspecified: Secondary | ICD-10-CM | POA: Diagnosis not present

## 2023-11-19 ENCOUNTER — Ambulatory Visit: Payer: Self-pay | Admitting: Family Medicine

## 2023-11-27 ENCOUNTER — Other Ambulatory Visit: Payer: Self-pay | Admitting: Internal Medicine

## 2023-11-27 DIAGNOSIS — N184 Chronic kidney disease, stage 4 (severe): Secondary | ICD-10-CM

## 2023-12-02 DIAGNOSIS — E1122 Type 2 diabetes mellitus with diabetic chronic kidney disease: Secondary | ICD-10-CM | POA: Diagnosis not present

## 2023-12-02 DIAGNOSIS — F250 Schizoaffective disorder, bipolar type: Secondary | ICD-10-CM | POA: Diagnosis not present

## 2023-12-03 ENCOUNTER — Ambulatory Visit (INDEPENDENT_AMBULATORY_CARE_PROVIDER_SITE_OTHER): Admitting: Internal Medicine

## 2023-12-03 ENCOUNTER — Encounter: Payer: Self-pay | Admitting: Internal Medicine

## 2023-12-03 VITALS — BP 134/80 | HR 96 | Ht 76.0 in | Wt 249.0 lb

## 2023-12-03 DIAGNOSIS — E1122 Type 2 diabetes mellitus with diabetic chronic kidney disease: Secondary | ICD-10-CM

## 2023-12-03 DIAGNOSIS — N184 Chronic kidney disease, stage 4 (severe): Secondary | ICD-10-CM | POA: Diagnosis not present

## 2023-12-03 DIAGNOSIS — E785 Hyperlipidemia, unspecified: Secondary | ICD-10-CM | POA: Diagnosis not present

## 2023-12-03 DIAGNOSIS — Z794 Long term (current) use of insulin: Secondary | ICD-10-CM

## 2023-12-03 LAB — POCT GLYCOSYLATED HEMOGLOBIN (HGB A1C): Hemoglobin A1C: 5.4 % (ref 4.0–5.6)

## 2023-12-03 MED ORDER — LANTUS SOLOSTAR 100 UNIT/ML ~~LOC~~ SOPN: 25.0000 [IU] | PEN_INJECTOR | Freq: Two times a day (BID) | SUBCUTANEOUS | Status: DC

## 2023-12-03 MED ORDER — FREESTYLE LIBRE 3 PLUS SENSOR MISC: 1.0000 | 3 refills | Status: AC

## 2023-12-03 NOTE — Progress Notes (Signed)
 Patient ID: Jonathan Lukehart., male   DOB: 04/17/1966, 58 y.o.   MRN: [id number]  HPI: Jonathan Dutter. is a 58 y.o.-year-old male, returning for follow-up for DM2, dx in 2012, insulin -dependent since 2017, uncontrolled, with complications (stage 4 CKD, PN, fatty liver). Last OV 4 mo ago.  Interim hx: He has no increased urination, no blurry vision, nausea, chest pain.   Still has back pain and uses a cane or a walker.  At last visit, he was able to diversify his diet and he is now only drinking protein shakes in the morning and regular food for lunch and dinner.  He gained approximately 30 pounds since last visit.  Reviewed HbA1c: Lab Results  Component Value Date   HGBA1C 6.1 (A) 08/03/2023   HGBA1C 7.9 (A) 04/03/2023   HGBA1C 7.0 12/05/2022   HGBA1C 7.3 (A) 07/16/2022   HGBA1C 8.7 (A) 04/10/2022   HGBA1C 8.0 (H) 12/02/2021   HGBA1C 9.4 (A) 09/02/2021   HGBA1C 7.8 (A) 06/11/2021   HGBA1C 7.1 (H) 05/27/2021   HGBA1C 8.1 (A) 11/14/2020   Pt was on a regimen of: - NPH 45 units in a.m. Prev. On Lantus ,  Levemir , NovoLog , Humalog , Tradjenta , Metformin .  He is now on: - Lantus  20 units before b'fast and 20 units at bedtime >> 40 units in am as he was forgetting to bedtime dose - Humalog  U200 30 units 3x a day - Trulicity  1.5 >> 3 mg weekly  Pt checks his sugars >4x a day with the Libre CGM 3+:  Prev.  Previously:  Previously:   Lowest sugar was  60s >> 59 >> 54 >> 45; he has hypoglycemia awareness at 70.  Highest sugar was 200s >> HI >> 300s >> 200s.  Glucometer: Accu-Chek guide  Meals: - Protein shake - Lunch  - skips >> protein shake >> baked potato - Dinner - potato cheddar bake (15 units) >> protein shake >> TV dinner  - + CKD-sees nephrology, last BUN/creatinine:  Lab Results  Component Value Date   BUN 21 07/28/2023   BUN 19 03/26/2023   CREATININE 1.63 (H) 07/28/2023   CREATININE 1.76 (H) 03/26/2023  On lisinopril .  - + HL; last set of  lipids: Lab Results  Component Value Date   CHOL 121 09/24/2023   HDL 35 (L) 09/24/2023   LDLCALC 53 09/24/2023   LDLDIRECT 62.0 07/16/2022   TRIG 199 (H) 09/24/2023   CHOLHDL 3 07/16/2022  On Lipitor 10 mg daily Tricor  48 mg daily. He could not take fish oil 2/2 large capsule.  - last eye exam was on 10/08/2023. No DR.  - no numbness and tingling in his feet.  On Neurontin  300 mg twice a day.  Last foot exam by Sikora 06/17/2023.  He also has a history of hypogonadism, HTN, back pain, bipolar disorder, PTSD, history of suicide attempt. He undergoes ECT treatments. He has a history of schizoaffective disorder and also poor memory.  He has an aide that helps him.  He gives his own insulin . He was admitted 11/2021 for pneumonia.  In 2023, he had a thyroid  ultrasound showing 3 nodules, of which do need to be followed with annual ultrasounds.  This is managed by PCP.  ROS: + see HPI  Past Medical History:  Diagnosis Date   ADD (attention deficit disorder)    Allergy    Anxiety    Arthritis    right hip   Bipolar 1 disorder (HCC)    Blood in  urine    CKD (chronic kidney disease), stage III (HCC)    Creatinine elevation    Dementia (HCC)    early onset (08/04/2017)   Depression    bipolar [facility]   Diabetes mellitus without complication (HCC)    Dizziness    Family history of anesthesia complication    pt is unsure , but pt father may have been difficult to arouse    Frequent falls    HCAP (healthcare-associated pneumonia) 10/31/2012   History of kidney stones    Hypertension    Hypogonadism male    Liver fatty degeneration    Lumbar radiculopathy    Microscopic hematuria    hereditary s/p Urology eval   Neuromuscular disorder (HCC)    feet neuropathy    Osteoarthritis of right hip 11/28/2011   2012 2015 s/p THR Severe  Dr Loa     Pleural effusion 11/02/2012   Pneumonia 10/2012   Pneumonia, organism unspecified(486) 11/02/2012   Polysubstance  dependence, non-opioid, in remission (HCC)    remote   Primary osteoarthritis of left hip 05/22/2015   PTSD (post-traumatic stress disorder)    SOCIAL ANXIETY DISORDER    Schizoaffective disorder (HCC)    Substance abuse (HCC)    Suicide attempt by multiple drug overdose (HCC) 2016-01-10   Grieving his cat's death 01-05-16   Past Surgical History:  Procedure Laterality Date   BACK SURGERY     CLOSED REDUCTION METACARPAL WITH PERCUTANEOUS PINNING Right    COLONOSCOPY  03/21/2020   LUMBAR DISC SURGERY     TONSILLECTOMY     TOTAL HIP ARTHROPLASTY Right 08/16/2013   Procedure: TOTAL HIP ARTHROPLASTY ANTERIOR APPROACH;  Surgeon: Maude KANDICE Herald, MD;  Location: [facility] OR;  Service: Orthopedics;  Laterality: Right;   TOTAL HIP ARTHROPLASTY Left 05/22/2015   Procedure: TOTAL HIP ARTHROPLASTY ANTERIOR APPROACH;  Surgeon: Maude Herald, MD;  Location: [facility] OR;  Service: Orthopedics;  Laterality: Left;   Social History   Socioeconomic History   Marital status: Single    Spouse name: Not on file   Number of children: 0   Years of education: 14   Highest education level: Some college, no degree  Occupational History   Occupation: disability  Tobacco Use   Smoking status: Former    Current packs/day: 0.00    Types: Cigarettes    Start date: 01/05/2019    Quit date: 03/22/2019    Years since quitting: 4.7   Smokeless tobacco: Never  Vaping Use   Vaping status: Never Used  Substance and Sexual Activity   Alcohol  use: Not Currently   Drug use: Not Currently    Types: Marijuana, Crack cocaine, Cocaine    Comment: hx of marijuana/cocaine/crack use but sober since 01-05-24   Sexual activity: Not Currently  Other Topics Concern   Not on file  Social History Narrative   05/08/2021 lives alone, sister Luke helps with meds, he has some in home care, lived with sister until Nov 2020   Caffeine- sodas, amount  varies   regular exercise-no   Social Drivers of Health   Financial Resource Strain: Low  Risk  (11/04/2023)   Overall Financial Resource Strain (CARDIA)    Difficulty of Paying Living Expenses: Not hard at all  Food Insecurity: No Food Insecurity (11/04/2023)   Hunger Vital Sign    Worried About Running Out of Food in the Last Year: Never true    Ran Out of Food in the Last Year: Never true  Transportation Needs:  No Transportation Needs (11/04/2023)   PRAPARE - Administrator, Civil Service (Medical): No    Lack of Transportation (Non-Medical): No  Physical Activity: Inactive (11/04/2023)   Exercise Vital Sign    Days of Exercise per Week: 0 days    Minutes of Exercise per Session: 0 min  Stress: Stress Concern Present (11/04/2023)   [employer] of Occupational Health - Occupational Stress Questionnaire    Feeling of Stress: Very much  Social Connections: Moderately Isolated (11/04/2023)   Social Connection and Isolation Panel    Frequency of Communication with Friends and Family: Once a week    Frequency of Social Gatherings with Friends and Family: Twice a week    Attends Religious Services: Never    Database administrator or Organizations: Yes    Attends Banker Meetings: 1 to 4 times per year    Marital Status: Never married  Intimate Partner Violence: Not At Risk (11/04/2023)   Humiliation, Afraid, Rape, and Kick questionnaire    Fear of Current or Ex-Partner: No    Emotionally Abused: No    Physically Abused: No    Sexually Abused: No   Current Outpatient Medications on File Prior to Visit  Medication Sig Dispense Refill   acetaminophen  (TYLENOL ) 500 MG tablet Take 1 tablet (500 mg total) by mouth every 8 (eight) hours as needed. 90 tablet 1   alfuzosin  (UROXATRAL ) 10 MG 24 hr tablet Take 1 tablet (10 mg total) by mouth daily with breakfast. 90 tablet 1   ARIPiprazole  (ABILIFY ) 10 MG tablet Take 1 tablet (10 mg total) by mouth daily. 30 tablet 0   atorvastatin  (LIPITOR) 10 MG tablet TAKE 1 TABLET EVERY DAY 90 tablet 3   buPROPion   (WELLBUTRIN  XL) 300 MG 24 hr tablet SMARTSIG:1.0 Tablet(s) By Mouth Daily     clonazePAM  (KLONOPIN ) 0.5 MG tablet Take by mouth.     Continuous Blood Gluc Receiver (FREESTYLE LIBRE READER) DEVI Use as directed 1 each each   DROPLET PEN NEEDLES 31G X 5 MM MISC USE 4 TIMES DAILY 400 each 3   Dulaglutide  (TRULICITY ) 3 MG/0.5ML SOAJ INJECT 3 MG INTO THE SKIN ONCE A WEEK. 6 mL 3   fenofibrate  (TRICOR ) 48 MG tablet Take 1 tablet (48 mg total) by mouth daily. 90 tablet 3   fluticasone  (FLONASE ) 50 MCG/ACT nasal spray USE 1 SPRAY IN EACH NOSTRIL EVERY DAY 32 g 2   glucose blood (ACCU-CHEK GUIDE) test strip CHECK SUGAR FOUR TIMES DAILY 100 each 12   HUMALOG  KWIKPEN 200 UNIT/ML KwikPen INJECT 15 TO 20 UNITS UNDER THE SKIN 3 TIMES DAILY BEFORE MEALS. (DISCARD PEN 28 DAYS AFTER OPENING) 30 mL 3   insulin  glargine (LANTUS  SOLOSTAR) 100 UNIT/ML Solostar Pen Inject 40 Units into the skin daily. 45 mL 3   losartan  (COZAAR ) 25 MG tablet Take 1 tablet (25 mg total) by mouth daily. 90 tablet 1   Multiple Vitamins-Minerals (MULTIVITAMIN WITH MINERALS) tablet Take 1 tablet by mouth daily.     pantoprazole  (PROTONIX ) 40 MG tablet Take 1 tablet (40 mg total) by mouth daily. 90 tablet 1   polyethylene glycol powder (GLYCOLAX /MIRALAX ) 17 GM/SCOOP powder Take 17 g by mouth daily. 3350 g 1   traZODone  (DESYREL ) 100 MG tablet Take 200 mg by mouth at bedtime as needed.     No current facility-administered medications on file prior to visit.   Allergies  Allergen Reactions   Egg Solids, Whole Nausea Only   Hydrocodone  Rash  Family History  Problem Relation Age of Onset   Diabetes Father    Cancer Mother        died of melanoma with mets   Cervical cancer Sister    Diabetes Sister    Other Neg Hx        hypogonadism   Colon cancer Neg Hx    Colon polyps Neg Hx    Esophageal cancer Neg Hx    Rectal cancer Neg Hx    Stomach cancer Neg Hx    PE: BP 134/80 (BP Location: Left Arm, Patient Position: Sitting,  Cuff Size: Normal)   Pulse 96   Ht 6' 4 (1.93 m)   Wt 249 lb (112.9 kg)   SpO2 96%   BMI 30.31 kg/m  Wt Readings from Last 10 Encounters:  12/03/23 249 lb (112.9 kg)  11/04/23 230 lb (104.3 kg)  09/24/23 228 lb (103.4 kg)  08/03/23 217 lb 9.6 oz (98.7 kg)  07/28/23 220 lb 9.6 oz (100.1 kg)  04/03/23 228 lb 3.2 oz (103.5 kg)  03/30/23 230 lb (104.3 kg)  03/26/23 230 lb (104.3 kg)  03/12/23 225 lb (102.1 kg)  12/05/22 223 lb 12.8 oz (101.5 kg)   Constitutional: overweight, in NAD, walks with a cane Eyes:  EOMI, no exophthalmos ENT: no neck masses, no cervical lymphadenopathy Cardiovascular: RRR, No MRG Respiratory: CTA B Musculoskeletal: no deformities Skin:no rashes Neurological: no tremor with outstretched hands  ASSESSMENT: 1. DM2, insulin -dependent, uncontrolled, with complications - CKD - PN - fatty liver  2. HL  PLAN:  1. Patient with longstanding, previously uncontrolled type 2 diabetes, on injectable antidiabetic regimen with basal-bolus insulin  and weekly GLP-1 receptor agonist, with improved control at last visit.  At that time, HbA1c was lower, at 6.1%, at goal.  He had hyperglycemic exceptions especially overnight and occasional lows in the 60s and even 50s later in the day.  Upon questioning, he was taking a higher dose of Humalog  in the morning, 30 units, and was waiting more than an hour before drinking his protein shakes.  After drinking this, sugars were still higher and then started to improve later in the day.  He was not taking Humalog  before lunch and dinner which were all protein shakes but was taking 20 to 25 units of Humalog  for correction only if the sugars were high after these meals.  We discussed about trying to switch to more solid meals and to take the Humalog  approximately 15 minutes before the meals.  He did not feel that he could tolerate other foods.  He did not have any fiber in the diet and we discussed about possibly including some vegetables  or fruit in his protein shakes, at least. I also recommended to move the Lantus  at bedtime but to reduce the dose to avoid hypoglycemia overnight. CGM interpretation: -At today's visit, we reviewed his CGM downloads: It appears that 91% of values are in target range (goal >70%), while 4% are higher than 180 (goal <25%), and 5% are lower than 70 (goal <4%).  The calculated average blood sugar is 121.  The projected HbA1c for the next 3 months (GMI) is 6.2%. -Reviewing the CGM trends, sugars appear to be fluctuating within the target range but he does have low blood sugars throughout the day and also at night.  Upon questioning, however, he is taking high doses of insulin , much higher than recommended.  At last visit I advised him to take 25 units of Lantus  once a day but  he is not taking 40 units twice a day.  He also almost doubled the dose of Lyumjev , taking 30 units 3 times a day.  We discussed that this is definitely too much insulin  for him, especially in the setting of an HbA1c of 5.4%.  He is frustrated about weight gain and feels that Trulicity  is not working for him, but we discussed that decreasing the dose of insulin  should also help with this.  At next visit, however, if he is not losing weight, we can change his GLP-1 receptor agonist. -I also recommended to vary the dose of Humalog  before meals, since now he is taking a fixed dose, which is conducive to fluctuations in blood sugars. - I suggested to:  Patient Instructions  Please continue: - Trulicity  3 mg weekly  Decrease: - Lantus   25-30 units 2x a day - Humalog  U200:  15 min before meals  - 15-25 units before protein shakes or meals  Please come back for a follow-up appointment in 3-4 months.   - we checked his HbA1c: 5.4% (lower) - advised to check sugars at different times of the day - 4x a day, rotating check times - advised for yearly eye exams >> he is UTD - return to clinic in 3-4 months  2. HL - Lipid panel from 09/2023  showed an LDL at goal, triglycerides elevated, HDL low: Lab Results  Component Value Date   CHOL 121 09/24/2023   HDL 35 (L) 09/24/2023   LDLCALC 53 09/24/2023   LDLDIRECT 62.0 07/16/2022   TRIG 199 (H) 09/24/2023   CHOLHDL 3 07/16/2022  - He continues Lipitor 10 mg daily without side effects I previously also suggested fish oil 1000 mg twice a day but he felt that the capsule was too large.  He is also on Tricor  48 mg daily.  Lela Fendt, MD PhD [facility]

## 2023-12-03 NOTE — Addendum Note (Signed)
 Addended by: OCTAVIO DIETRICH CROME on: 12/03/2023 03:12 PM   Modules accepted: Orders

## 2023-12-03 NOTE — Patient Instructions (Addendum)
 Please continue: - Trulicity  3 mg weekly  Decrease: - Lantus   25-30 units 2x a day - Humalog  U200:  15 min before meals  - 15-25 units before protein shakes or meals  Please come back for a follow-up appointment in 3-4 months.

## 2023-12-15 DIAGNOSIS — M47816 Spondylosis without myelopathy or radiculopathy, lumbar region: Secondary | ICD-10-CM | POA: Diagnosis not present

## 2023-12-15 DIAGNOSIS — M47817 Spondylosis without myelopathy or radiculopathy, lumbosacral region: Secondary | ICD-10-CM | POA: Diagnosis not present

## 2023-12-23 DIAGNOSIS — F4010 Social phobia, unspecified: Secondary | ICD-10-CM | POA: Diagnosis not present

## 2023-12-23 DIAGNOSIS — F4312 Post-traumatic stress disorder, chronic: Secondary | ICD-10-CM | POA: Diagnosis not present

## 2023-12-30 ENCOUNTER — Encounter: Payer: Self-pay | Admitting: Podiatry

## 2023-12-30 ENCOUNTER — Ambulatory Visit (INDEPENDENT_AMBULATORY_CARE_PROVIDER_SITE_OTHER): Payer: Medicare HMO | Admitting: Podiatry

## 2023-12-30 DIAGNOSIS — E1142 Type 2 diabetes mellitus with diabetic polyneuropathy: Secondary | ICD-10-CM

## 2023-12-30 DIAGNOSIS — M79674 Pain in right toe(s): Secondary | ICD-10-CM | POA: Diagnosis not present

## 2023-12-30 DIAGNOSIS — B351 Tinea unguium: Secondary | ICD-10-CM

## 2023-12-30 DIAGNOSIS — M79675 Pain in left toe(s): Secondary | ICD-10-CM | POA: Diagnosis not present

## 2023-12-30 DIAGNOSIS — S90414A Abrasion, right lesser toe(s), initial encounter: Secondary | ICD-10-CM | POA: Diagnosis not present

## 2023-12-30 NOTE — Progress Notes (Signed)
  Subjective:  Patient ID: [name]., male    DOB: August 05, 1965,  MRN: [id number]  [name]. presents to clinic today for at risk foot care with history of diabetic neuropathy and painful mycotic toenails of both feet that are difficult to trim. Pain interferes with daily activities and wearing enclosed shoe gear comfortably.  Chief Complaint  Patient presents with   RFC    Diabetes:yes Last A1c: 6.1 Last FSBS:81 Anticoagulant: PCP Name & Last Visit: Corrina Sabin, MD/approx July 2025 and upcoming visit in December   New problem(s): None.   PCP is Sabin Corrina, MD.  Allergies  Allergen Reactions   Egg Solids, Whole Nausea Only   Hydrocodone  Rash    Review of Systems: Negative except as noted in the HPI.  Objective:  There were no vitals filed for this visit. [name] [name]. is a pleasant 58 y.o. male in NAD. AAO x 3.  Vascular Examination: Capillary refill time immediate b/l. Palpable pedal pulses. No pain with calf compression b/l. Skin temperature gradient WNL b/l. No cyanosis or clubbing b/l. No ischemia or gangrene noted b/l. Pedal hair sparse. No edema noted b/l LE.  Neurological Examination: Sensation grossly intact b/l with 10 gram monofilament. Vibratory sensation intact b/l. Pt has subjective symptoms of neuropathy.  Dermatological Examination: Patient has healing abrasions dorsal right 2nd and 3rd digits. No erythema, no edema, no drainage, no fluctuance.  Pedal skin with normal turgor, texture and tone b/l. No interdigital macerations.   Toenails 1-5 b/l thick, discolored, elongated with subungual debris and pain on dorsal palpation.   No corns, calluses nor porokeratotic lesions noted.  Musculoskeletal Examination: Muscle strength 5/5 to all lower extremity muscle groups bilaterally. Hammertoe(s) 2-5 b/l. Utilizes cane for ambulation assistance.  Radiographs: None  Assessment/Plan: 1. Pain due to onychomycosis of toenails  of both feet   2. Abrasion of multiple toes of right foot   3. Diabetic peripheral neuropathy associated with type 2 diabetes mellitus (HCC)   -Consent given for treatment. Patient examined. All patient's and/or POA's questions/concerns addressed on today's visit. Mycotic toenails 1-5 debrided in length and girth without incident. Continue foot and shoe inspections daily. Monitor blood glucose per PCP/Endocrinologist's recommendations.Continue soft, supportive shoe gear daily. Report any pedal injuries to medical professional. Call office if there are any quesitons/concerns. -For abrasion, patient to apply Neosporin to R 2nd toe and R 3rd toe once daily until healed. -Patient/POA to call should there be question/concern in the interim.   Return in about 3 months (around 03/30/2024).  Delon LITTIE Merlin, DPM      [facility] LOCATION: [address] [place] [address]                   Office [phone]   [facility] LOCATION: [address] [place], [place] [address] Office [phone]

## 2024-01-06 DIAGNOSIS — F4312 Post-traumatic stress disorder, chronic: Secondary | ICD-10-CM | POA: Diagnosis not present

## 2024-01-07 DIAGNOSIS — M47817 Spondylosis without myelopathy or radiculopathy, lumbosacral region: Secondary | ICD-10-CM | POA: Diagnosis not present

## 2024-01-09 ENCOUNTER — Other Ambulatory Visit: Payer: Self-pay | Admitting: Family Medicine

## 2024-01-11 NOTE — Telephone Encounter (Signed)
 Requested Prescriptions  Pending Prescriptions Disp Refills   losartan  (COZAAR ) 25 MG tablet [Pharmacy Med Name: LOSARTAN  POTASSIUM 25 MG Oral Tablet] 90 tablet 0    Sig: TAKE 1 TABLET EVERY DAY     Cardiovascular:  Angiotensin Receptor Blockers Failed - 01/11/2024 12:06 PM      Failed - Cr in normal range and within 180 days    Creatinine, Ser  Date Value Ref Range Status  07/28/2023 1.63 (H) 0.76 - 1.27 mg/dL Final   Creatinine,U  Date Value Ref Range Status  08/10/2012 204.72 mg/dL Final    Comment:       Cutoff Values for Urine Drug Screen, Pain Mgmt          Drug Class           Cutoff (ng/mL)          Amphetamines             500          Barbiturates             200          Cocaine Metabolites      150          Benzodiazepines          200          Methadone                300          Opiates                  300          Phencyclidine             25          Propoxyphene             300          Marijuana Metabolites     50    For medical purposes only.   Creatinine, Urine  Date Value Ref Range Status  10/03/2015 35.87 mg/dL Final         Passed - K in normal range and within 180 days    Potassium  Date Value Ref Range Status  07/28/2023 5.1 3.5 - 5.2 mmol/L Final         Passed - Patient is not pregnant      Passed - Last BP in normal range    BP Readings from Last 1 Encounters:  12/03/23 134/80         Passed - Valid encounter within last 6 months    Recent Outpatient Visits           3 months ago Other dysphagia   [facility] [place]. [facility] Delbert Clam, MD   5 months ago Pain of right hip   [facility] [place]. [facility] Delbert Clam, MD   9 months ago Type 2 diabetes mellitus with stage 3a chronic kidney disease, with long-term current use of insulin  [facility])   [facility] [place]. [facility] Delbert Clam, MD   1 year ago Cutaneous abscess of back excluding buttocks   [facility] [place]. [facility] Theotis Haze ORN, NP   1 year ago Schizoaffective disorder, bipolar type (  HCC)   [facility] Of [place]. [facility] Delbert Clam, MD

## 2024-01-20 ENCOUNTER — Other Ambulatory Visit: Payer: Self-pay | Admitting: Family Medicine

## 2024-01-20 DIAGNOSIS — K219 Gastro-esophageal reflux disease without esophagitis: Secondary | ICD-10-CM

## 2024-01-20 DIAGNOSIS — N401 Enlarged prostate with lower urinary tract symptoms: Secondary | ICD-10-CM

## 2024-01-27 ENCOUNTER — Ambulatory Visit: Admitting: Family Medicine

## 2024-02-03 DIAGNOSIS — F4010 Social phobia, unspecified: Secondary | ICD-10-CM | POA: Diagnosis not present

## 2024-02-08 ENCOUNTER — Other Ambulatory Visit: Payer: Self-pay | Admitting: Internal Medicine

## 2024-02-08 ENCOUNTER — Other Ambulatory Visit: Payer: Self-pay | Admitting: Family Medicine

## 2024-02-08 DIAGNOSIS — E1122 Type 2 diabetes mellitus with diabetic chronic kidney disease: Secondary | ICD-10-CM

## 2024-02-08 DIAGNOSIS — Z794 Long term (current) use of insulin: Secondary | ICD-10-CM

## 2024-02-09 DIAGNOSIS — E785 Hyperlipidemia, unspecified: Secondary | ICD-10-CM | POA: Diagnosis not present

## 2024-02-09 DIAGNOSIS — F4310 Post-traumatic stress disorder, unspecified: Secondary | ICD-10-CM | POA: Diagnosis not present

## 2024-02-09 DIAGNOSIS — K5900 Constipation, unspecified: Secondary | ICD-10-CM | POA: Diagnosis not present

## 2024-02-09 DIAGNOSIS — J309 Allergic rhinitis, unspecified: Secondary | ICD-10-CM | POA: Diagnosis not present

## 2024-02-09 DIAGNOSIS — E1142 Type 2 diabetes mellitus with diabetic polyneuropathy: Secondary | ICD-10-CM | POA: Diagnosis not present

## 2024-02-09 DIAGNOSIS — F319 Bipolar disorder, unspecified: Secondary | ICD-10-CM | POA: Diagnosis not present

## 2024-02-09 DIAGNOSIS — F4010 Social phobia, unspecified: Secondary | ICD-10-CM | POA: Diagnosis not present

## 2024-02-09 DIAGNOSIS — R269 Unspecified abnormalities of gait and mobility: Secondary | ICD-10-CM | POA: Diagnosis not present

## 2024-02-09 DIAGNOSIS — M519 Unspecified thoracic, thoracolumbar and lumbosacral intervertebral disc disorder: Secondary | ICD-10-CM | POA: Diagnosis not present

## 2024-02-09 DIAGNOSIS — K219 Gastro-esophageal reflux disease without esophagitis: Secondary | ICD-10-CM | POA: Diagnosis not present

## 2024-02-09 DIAGNOSIS — Z9181 History of falling: Secondary | ICD-10-CM | POA: Diagnosis not present

## 2024-02-09 DIAGNOSIS — F259 Schizoaffective disorder, unspecified: Secondary | ICD-10-CM | POA: Diagnosis not present

## 2024-02-09 DIAGNOSIS — I10 Essential (primary) hypertension: Secondary | ICD-10-CM | POA: Diagnosis not present

## 2024-02-09 DIAGNOSIS — Z7985 Long-term (current) use of injectable non-insulin antidiabetic drugs: Secondary | ICD-10-CM | POA: Diagnosis not present

## 2024-02-09 DIAGNOSIS — R32 Unspecified urinary incontinence: Secondary | ICD-10-CM | POA: Diagnosis not present

## 2024-02-09 DIAGNOSIS — M545 Low back pain, unspecified: Secondary | ICD-10-CM | POA: Diagnosis not present

## 2024-02-09 DIAGNOSIS — E669 Obesity, unspecified: Secondary | ICD-10-CM | POA: Diagnosis not present

## 2024-02-09 DIAGNOSIS — Z794 Long term (current) use of insulin: Secondary | ICD-10-CM | POA: Diagnosis not present

## 2024-02-16 DIAGNOSIS — F332 Major depressive disorder, recurrent severe without psychotic features: Secondary | ICD-10-CM | POA: Diagnosis not present

## 2024-02-22 ENCOUNTER — Encounter: Payer: Self-pay | Admitting: Radiology

## 2024-02-24 DIAGNOSIS — F4010 Social phobia, unspecified: Secondary | ICD-10-CM | POA: Diagnosis not present

## 2024-03-09 DIAGNOSIS — F4010 Social phobia, unspecified: Secondary | ICD-10-CM | POA: Diagnosis not present

## 2024-03-14 DIAGNOSIS — N401 Enlarged prostate with lower urinary tract symptoms: Secondary | ICD-10-CM | POA: Diagnosis not present

## 2024-03-14 DIAGNOSIS — N5203 Combined arterial insufficiency and corporo-venous occlusive erectile dysfunction: Secondary | ICD-10-CM | POA: Diagnosis not present

## 2024-03-14 DIAGNOSIS — R300 Dysuria: Secondary | ICD-10-CM | POA: Diagnosis not present

## 2024-03-14 DIAGNOSIS — R972 Elevated prostate specific antigen [PSA]: Secondary | ICD-10-CM | POA: Diagnosis not present

## 2024-03-21 ENCOUNTER — Other Ambulatory Visit: Payer: Self-pay | Admitting: Urology

## 2024-03-21 DIAGNOSIS — R972 Elevated prostate specific antigen [PSA]: Secondary | ICD-10-CM

## 2024-03-22 ENCOUNTER — Encounter: Payer: Self-pay | Admitting: Urology

## 2024-03-23 ENCOUNTER — Other Ambulatory Visit: Payer: Self-pay | Admitting: Medical Genetics

## 2024-03-24 ENCOUNTER — Other Ambulatory Visit: Payer: Self-pay | Admitting: Family Medicine

## 2024-03-28 ENCOUNTER — Encounter: Payer: Self-pay | Admitting: Family Medicine

## 2024-03-28 ENCOUNTER — Ambulatory Visit: Attending: Family Medicine | Admitting: Family Medicine

## 2024-03-28 VITALS — BP 116/74 | HR 101 | Temp 98.0°F | Wt 245.6 lb

## 2024-03-28 DIAGNOSIS — E1169 Type 2 diabetes mellitus with other specified complication: Secondary | ICD-10-CM

## 2024-03-28 DIAGNOSIS — N401 Enlarged prostate with lower urinary tract symptoms: Secondary | ICD-10-CM

## 2024-03-28 DIAGNOSIS — K219 Gastro-esophageal reflux disease without esophagitis: Secondary | ICD-10-CM

## 2024-03-28 DIAGNOSIS — R1031 Right lower quadrant pain: Secondary | ICD-10-CM

## 2024-03-28 DIAGNOSIS — E119 Type 2 diabetes mellitus without complications: Secondary | ICD-10-CM

## 2024-03-28 DIAGNOSIS — E1122 Type 2 diabetes mellitus with diabetic chronic kidney disease: Secondary | ICD-10-CM

## 2024-03-28 DIAGNOSIS — K5900 Constipation, unspecified: Secondary | ICD-10-CM

## 2024-03-28 DIAGNOSIS — E781 Pure hyperglyceridemia: Secondary | ICD-10-CM

## 2024-03-28 DIAGNOSIS — R972 Elevated prostate specific antigen [PSA]: Secondary | ICD-10-CM

## 2024-03-28 DIAGNOSIS — F250 Schizoaffective disorder, bipolar type: Secondary | ICD-10-CM

## 2024-03-28 LAB — GLUCOSE, POCT (MANUAL RESULT ENTRY): POC Glucose: 96 mg/dL (ref 70–99)

## 2024-03-28 MED ORDER — LOSARTAN POTASSIUM 25 MG PO TABS: 25.0000 mg | ORAL_TABLET | Freq: Every day | ORAL | 1 refills | Status: AC

## 2024-03-28 MED ORDER — ALFUZOSIN HCL ER 10 MG PO TB24: 10.0000 mg | ORAL_TABLET | Freq: Every day | ORAL | 1 refills | Status: AC

## 2024-03-28 MED ORDER — FENOFIBRATE 48 MG PO TABS: 48.0000 mg | ORAL_TABLET | Freq: Every day | ORAL | 1 refills | Status: AC

## 2024-03-28 MED ORDER — PANTOPRAZOLE SODIUM 40 MG PO TBEC: 40.0000 mg | DELAYED_RELEASE_TABLET | Freq: Every day | ORAL | 1 refills | Status: AC

## 2024-03-28 MED ORDER — ATORVASTATIN CALCIUM 10 MG PO TABS: 10.0000 mg | ORAL_TABLET | Freq: Every day | ORAL | 1 refills | Status: AC

## 2024-03-28 MED ORDER — LIDOCAINE 5 % EX PTCH: 1.0000 | MEDICATED_PATCH | CUTANEOUS | 1 refills | Status: AC

## 2024-03-28 NOTE — Progress Notes (Signed)
 Subjective:  Patient ID: Bennet Waddell Sherrine Mickey., male    DOB: [birth year]  Age: 58 y.o. MRN: [id number]  CC: Medical Management of Chronic Issues (Endo appt tomorrow )     Discussed the use of AI scribe software for clinical note transcription with the patient, who gave verbal consent to proceed.  History of Present Illness Ardis Fullwood. 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  who presents with sharp right lower abdominal pain.  He reports sharp right lower abdominal pain which been present for few months that has worsened over the past few days, up to 6-7/10, worse with movement or sitting up. He has no nausea, vomiting, or radiation of pain to the back.  Pain is worse when he sits up but is absent at the moment.  He has constipation with bowel movements two to three times per week and uses Dulcolax with relief. His diet is low in fiber, consisting mostly of protein shakes and potatoes with minimal fruits and vegetables.  He has diabetes, with a recent A1c of 5.4, and chronic back pain. Current medications include alfuzosin , atorvastatin , Tricor , and daily pantoprazole  for reflux and heartburn prevention.  He is followed by a kidney specialist for stage III chronic kidney disease, with planned blood and urine testing at home for kidney monitoring.  Also under the care of urology for BPH and most recent PSA was slightly elevated.  MRI of the prostate ordered and he has a follow-up with urology    Past Medical History:  Diagnosis Date   ADD (attention deficit disorder)    Allergy    Anxiety    Arthritis    right hip   Bipolar 1 disorder (HCC)    Blood in urine    CKD  (chronic kidney disease), stage III (HCC)    Creatinine elevation    Dementia (HCC)    early onset (08/04/2017)   Depression    bipolar [facility]   Diabetes mellitus without complication (HCC)    Dizziness    Family history of anesthesia complication    pt is unsure , but pt father may have been difficult to arouse    Frequent falls    HCAP (healthcare-associated pneumonia) 10/31/2012   History of kidney stones    Hypertension    Hypogonadism male    Liver fatty degeneration    Lumbar radiculopathy    Microscopic hematuria    hereditary s/p Urology eval   Neuromuscular disorder (HCC)    feet neuropathy    Osteoarthritis of right hip 11/28/2011   2012 2015 s/p THR Severe  Dr Loa     Pleural effusion 11/02/2012   Pneumonia 10/2012   Pneumonia, organism unspecified(486) 11/02/2012   Polysubstance dependence, non-opioid, in remission (HCC)    remote   Primary osteoarthritis of left hip 05/22/2015   PTSD (post-traumatic stress disorder)    SOCIAL ANXIETY DISORDER    Schizoaffective disorder (HCC)    Substance abuse (HCC)    Suicide attempt by multiple drug overdose (HCC) 01/06/2016   Grieving his cat's death 04-15-2016    Past Surgical History:  Procedure Laterality Date   BACK SURGERY     CLOSED REDUCTION METACARPAL WITH PERCUTANEOUS PINNING Right  COLONOSCOPY  03/21/2020   LUMBAR DISC SURGERY     TONSILLECTOMY     TOTAL HIP ARTHROPLASTY Right 08/16/2013   Procedure: TOTAL HIP ARTHROPLASTY ANTERIOR APPROACH;  Surgeon: Maude KANDICE Herald, MD;  Location: [facility] OR;  Service: Orthopedics;  Laterality: Right;   TOTAL HIP ARTHROPLASTY Left 05/22/2015   Procedure: TOTAL HIP ARTHROPLASTY ANTERIOR APPROACH;  Surgeon: Maude Herald, MD;  Location: [facility] OR;  Service: Orthopedics;  Laterality: Left;    Family History  Problem Relation Age of Onset   Diabetes Father    Cancer Mother        died of melanoma with mets   Cervical cancer Sister    Diabetes Sister    Other Neg Hx         hypogonadism   Colon cancer Neg Hx    Colon polyps Neg Hx    Esophageal cancer Neg Hx    Rectal cancer Neg Hx    Stomach cancer Neg Hx     Social History   Socioeconomic History   Marital status: Single    Spouse name: Not on file   Number of children: 0   Years of education: 14   Highest education level: Some college, no degree  Occupational History   Occupation: disability  Tobacco Use   Smoking status: Former    Current packs/day: 0.00    Types: Cigarettes    Start date: 12/21/2018    Quit date: 03/22/2019    Years since quitting: 5.0   Smokeless tobacco: Never  Vaping Use   Vaping status: Never Used  Substance and Sexual Activity   Alcohol  use: Not Currently   Drug use: Not Currently    Types: Marijuana, Crack cocaine, Cocaine    Comment: hx of marijuana/cocaine/crack use but sober since 20's   Sexual activity: Not Currently  Other Topics Concern   Not on file  Social History Narrative   05/08/2021 lives alone, sister Luke helps with meds, he has some in home care, lived with sister until Nov 2020   Caffeine- sodas, amount  varies   regular exercise-no   Social Drivers of Health   Financial Resource Strain: Low Risk  (03/25/2024)   Overall Financial Resource Strain (CARDIA)    Difficulty of Paying Living Expenses: Not hard at all  Food Insecurity: No Food Insecurity (03/25/2024)   Hunger Vital Sign    Worried About Running Out of Food in the Last Year: Never true    Ran Out of Food in the Last Year: Never true  Transportation Needs: Unmet Transportation Needs (03/25/2024)   PRAPARE - Transportation    Lack of Transportation (Medical): Yes    Lack of Transportation (Non-Medical): Yes  Physical Activity: Inactive (03/25/2024)   Exercise Vital Sign    Days of Exercise per Week: 0 days    Minutes of Exercise per Session: Not on file  Stress: Stress Concern Present (03/25/2024)   [employer] of Occupational Health - Occupational Stress Questionnaire     Feeling of Stress: Very much  Social Connections: Socially Isolated (03/25/2024)   Social Connection and Isolation Panel    Frequency of Communication with Friends and Family: Once a week    Frequency of Social Gatherings with Friends and Family: Never    Attends Religious Services: Never    Database Administrator or Organizations: Yes    Attends Engineer, Structural: More than 4 times per year    Marital Status: Never married    Allergies  Allergen  Reactions   Egg Solids, Whole Nausea Only   Hydrocodone  Rash    Outpatient Medications Prior to Visit  Medication Sig Dispense Refill   acetaminophen  (TYLENOL ) 500 MG tablet Take 1 tablet (500 mg total) by mouth every 8 (eight) hours as needed. 90 tablet 1   ARIPiprazole  (ABILIFY ) 10 MG tablet Take 1 tablet (10 mg total) by mouth daily. 30 tablet 0   buPROPion  (WELLBUTRIN  XL) 300 MG 24 hr tablet SMARTSIG:1.0 Tablet(s) By Mouth Daily     clonazePAM  (KLONOPIN ) 0.5 MG tablet Take by mouth.     Continuous Glucose Sensor (FREESTYLE LIBRE 3 PLUS SENSOR) MISC 1 each by Does not apply route every 14 (fourteen) days. 6 each 3   DROPLET PEN NEEDLES 31G X 5 MM MISC USE 4 TIMES DAILY 400 each 3   Dulaglutide  (TRULICITY ) 3 MG/0.5ML SOAJ INJECT 3 MG INTO THE SKIN ONCE A WEEK. 6 mL 3   fluticasone  (FLONASE ) 50 MCG/ACT nasal spray USE 1 SPRAY IN EACH NOSTRIL EVERY DAY 32 g 2   glucose blood (ACCU-CHEK GUIDE) test strip CHECK SUGAR FOUR TIMES DAILY 100 each 12   HUMALOG  KWIKPEN 200 UNIT/ML KwikPen INJECT 15 TO 20 UNITS UNDER THE SKIN 3 TIMES DAILY BEFORE MEALS. (DISCARD PEN 28 DAYS AFTER OPENING) (Patient taking differently: Take 30 units 3 times daily before meals) 30 mL 3   insulin  glargine (LANTUS  SOLOSTAR) 100 UNIT/ML Solostar Pen Inject 25-30 Units into the skin 2 (two) times daily. 45 mL 3   Multiple Vitamins-Minerals (MULTIVITAMIN WITH MINERALS) tablet Take 1 tablet by mouth daily.     polyethylene glycol powder (GLYCOLAX /MIRALAX ) 17 GM/SCOOP  powder Take 17 g by mouth daily. 3350 g 1   traZODone  (DESYREL ) 100 MG tablet Take 200 mg by mouth at bedtime as needed.     alfuzosin  (UROXATRAL ) 10 MG 24 hr tablet TAKE 1 TABLET EVERY DAY WITH BREAKFAST 90 tablet 1   atorvastatin  (LIPITOR) 10 MG tablet TAKE 1 TABLET EVERY DAY 90 tablet 3   fenofibrate  (TRICOR ) 48 MG tablet Take 1 tablet (48 mg total) by mouth daily. 90 tablet 3   losartan  (COZAAR ) 25 MG tablet TAKE 1 TABLET EVERY DAY 90 tablet 0   pantoprazole  (PROTONIX ) 40 MG tablet TAKE 1 TABLET EVERY DAY 90 tablet 1   No facility-administered medications prior to visit.     ROS Review of Systems  Constitutional:  Negative for activity change and appetite change.  HENT:  Negative for sinus pressure and sore throat.   Respiratory:  Negative for chest tightness, shortness of breath and wheezing.   Cardiovascular:  Negative for chest pain and palpitations.  Gastrointestinal:  Positive for abdominal pain and constipation. Negative for abdominal distention.  Genitourinary: Negative.   Musculoskeletal: Negative.   Psychiatric/Behavioral:  Negative for behavioral problems and dysphoric mood.     Objective:  BP 116/74   Pulse (!) 101   Temp 98 F (36.7 C)   Wt 245 lb 9.6 oz (111.4 kg)   SpO2 97%   BMI 29.90 kg/m      03/28/2024   10:28 AM 03/28/2024   10:12 AM 12/03/2023    2:50 PM  BP/Weight  Systolic BP 116 149 134  Diastolic BP 74 88 80  Wt. (Lbs)  245.6 249  BMI  29.9 kg/m2 30.31 kg/m2      Physical Exam Constitutional:      Appearance: He is well-developed.  Cardiovascular:     Rate and Rhythm: Tachycardia present.  Heart sounds: Normal heart sounds. No murmur heard. Pulmonary:     Effort: Pulmonary effort is normal.     Breath sounds: Normal breath sounds. No wheezing or rales.  Chest:     Chest wall: No tenderness.  Abdominal:     General: Bowel sounds are normal. There is no distension.     Palpations: Abdomen is soft. There is no mass.      Tenderness: There is no abdominal tenderness. There is no right CVA tenderness or left CVA tenderness.  Musculoskeletal:        General: Normal range of motion.     Right lower leg: No edema.     Left lower leg: No edema.  Neurological:     Mental Status: He is alert and oriented to person, place, and time.  Psychiatric:        Mood and Affect: Mood normal.        Latest Ref Rng & Units 07/28/2023   11:56 AM 03/26/2023   11:36 AM 02/10/2022    3:52 PM  CMP  Glucose 70 - 99 mg/dL 857  659  851   BUN 6 - 24 mg/dL 21  19  16    Creatinine 0.76 - 1.27 mg/dL 8.36  8.23  8.78   Sodium 134 - 144 mmol/L 140  138  143   Potassium 3.5 - 5.2 mmol/L 5.1  4.7  5.2   Chloride 96 - 106 mmol/L 100  98  105   CO2 20 - 29 mmol/L 25  23  24    Calcium  8.7 - 10.2 mg/dL 9.9  9.7  89.8   Total Protein 6.0 - 8.5 g/dL 7.0  7.6  7.3   Total Bilirubin 0.0 - 1.2 mg/dL 0.4  0.3  0.3   Alkaline Phos 44 - 121 IU/L 80  87  73   AST 0 - 40 IU/L 28  29  20    ALT 0 - 44 IU/L 25  26  23      Lipid Panel     Component Value Date/Time   CHOL 121 09/24/2023 1106   TRIG 199 (H) 09/24/2023 1106   HDL 35 (L) 09/24/2023 1106   CHOLHDL 3 07/16/2022 1144   VLDL 57.0 (H) 07/16/2022 1144   LDLCALC 53 09/24/2023 1106   LDLDIRECT 62.0 07/16/2022 1144    CBC    Component Value Date/Time   WBC 7.9 02/10/2022 1552   WBC 3.7 (L) 01/02/2022 0426   RBC 5.20 02/10/2022 1552   RBC 4.36 01/02/2022 0426   HGB 15.3 02/10/2022 1552   HCT 45.6 02/10/2022 1552   PLT 177 02/10/2022 1552   MCV 88 02/10/2022 1552   MCH 29.4 02/10/2022 1552   MCH 28.7 01/02/2022 0426   MCHC 33.6 02/10/2022 1552   MCHC 33.4 01/02/2022 0426   RDW 13.3 02/10/2022 1552   LYMPHSABS 1.3 12/15/2021 0659   LYMPHSABS 1.7 11/19/2021 1640   MONOABS 0.5 12/15/2021 0659   EOSABS 0.1 12/15/2021 0659   EOSABS 0.0 11/19/2021 1640   BASOSABS 0.0 12/15/2021 0659   BASOSABS 0.0 11/19/2021 1640    Lab Results  Component Value Date   HGBA1C 5.4  12/03/2023        Assessment & Plan Constipation and abdominal pain Chronic constipation with recent exacerbation of right lower abdominal pain. Differential includes muscle spasm and constipation. - Ordered abdominal x-ray. - Prescribed Lidoderm  patches. - Advised increasing dietary fiber intake. - Recommended Activia yogurt. - Instructed to send MyChart message if  pain persists for potential CT scan.  Type 2 diabetes mellitus with stage 3 chronic kidney disease Type 2 diabetes well-controlled with A1c of 5.4; goal is less than 7.0. Chronic kidney disease stage 3 managed by nephrologist. - Continue current diabetes management. -Due for urine microalbumin but he is unable to provide a urine specimen -Counseled on Diabetic diet, the healthy plate, 849 minutes of moderate intensity exercise/week Blood sugar logs with fasting goals of 80-120 mg/dl, random of less than 819 and in the event of sugars less than 60 mg/dl or greater than 599 mg/dl encouraged to notify the clinic. Advised on the need for annual eye exams, annual foot exams, Pneumonia vaccine.   Chronic back pain Stable Managed by back specialist. - Continue follow-up with back specialist.   Benign prostatic hyperplasia and elevated PSA Elevated PSA at 6.5. MRI of prostate ordered to rule out abnormalities. - Proceed with MRI of the prostate. - Ensure annual PSA testing. - Follow-up with urology   Hyperlipidemia associated with type 2 diabetes mellitus Managed with atorvastatin  and fenofibrate . - Continue current lipid-lowering therapy. - Low-cholesterol diet  Gastroesophageal reflux disease Managed with pantoprazole . Discussed potential side effects of long-term use. - Attempt to reduce pantoprazole  use to as-needed basis.  General health maintenance . Discussed dietary modifications for bowel regularity and overall health. - Encouraged dietary changes to include more fiber and healthy meals.      Meds  ordered this encounter  Medications   alfuzosin  (UROXATRAL ) 10 MG 24 hr tablet    Sig: Take 1 tablet (10 mg total) by mouth daily with breakfast.    Dispense:  90 tablet    Refill:  1   atorvastatin  (LIPITOR) 10 MG tablet    Sig: Take 1 tablet (10 mg total) by mouth daily.    Dispense:  90 tablet    Refill:  1   fenofibrate  (TRICOR ) 48 MG tablet    Sig: Take 1 tablet (48 mg total) by mouth daily.    Dispense:  90 tablet    Refill:  1   losartan  (COZAAR ) 25 MG tablet    Sig: Take 1 tablet (25 mg total) by mouth daily.    Dispense:  90 tablet    Refill:  1   pantoprazole  (PROTONIX ) 40 MG tablet    Sig: Take 1 tablet (40 mg total) by mouth daily.    Dispense:  90 tablet    Refill:  1   lidocaine  (LIDODERM ) 5 %    Sig: Place 1 patch onto the skin daily. Remove & Discard patch within 12 hours or as directed by MD    Dispense:  30 patch    Refill:  1    Follow-up: Return in about 6 months (around 09/26/2024) for Chronic medical conditions.       Corrina Sabin, MD, FAAFP. [facility] [place], [place] [phone]   03/28/2024, 12:45 PM

## 2024-03-28 NOTE — Patient Instructions (Signed)
 VISIT SUMMARY:  Today, you were seen for sharp right lower abdominal pain that has worsened over the past few days. We also reviewed your ongoing conditions, including diabetes, chronic back pain, benign prostatic hyperplasia, hyperlipidemia, and gastroesophageal reflux disease. Additionally, we discussed general health maintenance and dietary modifications.  YOUR PLAN:  -CONSTIPATION AND ABDOMINAL PAIN: You have chronic constipation which has recently worsened, causing sharp pain in your right lower abdomen. This could be due to muscle spasm or constipation. We have ordered an abdominal x-ray and prescribed Lidoderm  patches for pain relief. You should increase your dietary fiber intake and try Activia yogurt. If the pain continues, please send a message through MyChart for a potential CT scan.  -TYPE 2 DIABETES MELLITUS WITH STAGE 3 CHRONIC KIDNEY DISEASE: Your diabetes is well-controlled with a recent A1c of 5.4. Your chronic kidney disease is being managed by your kidney specialist. Please continue with your current diabetes management and ensure you have annual kidney function tests.  -CHRONIC BACK PAIN: Your chronic back pain is being managed by your back specialist. Please continue your follow-up appointments with them.  -BENIGN PROSTATIC HYPERPLASIA AND ELEVATED PSA: You have an elevated PSA level of 6.5, which requires further investigation to rule out any abnormalities. We have ordered an MRI of your prostate and you should ensure you have annual PSA testing.  -HYPERLIPIDEMIA: Your high cholesterol is being managed with atorvastatin  and fenofibrate . Please continue your current medication regimen.  -GASTROESOPHAGEAL REFLUX DISEASE: Your acid reflux is being managed with pantoprazole . We discussed the potential side effects of long-term use and recommend you try to reduce the use of pantoprazole  to an as-needed basis.  -GENERAL HEALTH MAINTENANCE: You received a flu shot today. We also  discussed the importance of dietary changes to include more fiber and healthy meals for better bowel regularity and overall health.  INSTRUCTIONS:  Please follow up with the abdominal x-ray as ordered. If your abdominal pain persists, send a message through MyChart for a potential CT scan. Continue with your current management plans for diabetes, chronic back pain, hyperlipidemia, and gastroesophageal reflux disease. Proceed with the MRI of your prostate and ensure annual PSA testing. Maintain your follow-up appointments with your kidney and back specialists. Make dietary changes to include more fiber and healthy meals.

## 2024-03-29 ENCOUNTER — Ambulatory Visit (INDEPENDENT_AMBULATORY_CARE_PROVIDER_SITE_OTHER): Admitting: Internal Medicine

## 2024-03-29 ENCOUNTER — Encounter: Payer: Self-pay | Admitting: Internal Medicine

## 2024-03-29 ENCOUNTER — Ambulatory Visit
Admission: RE | Admit: 2024-03-29 | Discharge: 2024-03-29 | Disposition: A | Source: Ambulatory Visit | Attending: Family Medicine | Admitting: Family Medicine

## 2024-03-29 VITALS — BP 120/70 | HR 74 | Ht 76.0 in | Wt 246.6 lb

## 2024-03-29 DIAGNOSIS — E785 Hyperlipidemia, unspecified: Secondary | ICD-10-CM | POA: Diagnosis not present

## 2024-03-29 DIAGNOSIS — Z794 Long term (current) use of insulin: Secondary | ICD-10-CM | POA: Diagnosis not present

## 2024-03-29 DIAGNOSIS — N184 Chronic kidney disease, stage 4 (severe): Secondary | ICD-10-CM

## 2024-03-29 DIAGNOSIS — R1031 Right lower quadrant pain: Secondary | ICD-10-CM

## 2024-03-29 DIAGNOSIS — E1122 Type 2 diabetes mellitus with diabetic chronic kidney disease: Secondary | ICD-10-CM

## 2024-03-29 LAB — POCT GLYCOSYLATED HEMOGLOBIN (HGB A1C): Hemoglobin A1C: 6 % — AB (ref 4.0–5.6)

## 2024-03-29 MED ORDER — OZEMPIC (0.25 OR 0.5 MG/DOSE) 2 MG/3ML ~~LOC~~ SOPN: 0.5000 mg | PEN_INJECTOR | SUBCUTANEOUS | 3 refills | Status: AC

## 2024-03-29 NOTE — Progress Notes (Signed)
 Patient ID: [name]., male   DOB: 1965-08-05, 58 y.o.   MRN: [id number]  HPI: [name]. is a 58 y.o.-year-old male, returning for follow-up for DM2, dx in 2012, insulin -dependent since 2017, uncontrolled, with complications (stage 4 CKD, PN, fatty liver). Last OV 4 mo ago.  Interim hx: He has no increased urination, no blurry vision, nausea, chest pain.   Still has back pain and uses a cane or a walker.   He gained approximately 30 pounds before last visit and lost 3-4 pounds since then.  Reviewed HbA1c: Lab Results  Component Value Date   HGBA1C 5.4 12/03/2023   HGBA1C 6.1 (A) 08/03/2023   HGBA1C 7.9 (A) 04/03/2023   HGBA1C 7.0 12/05/2022   HGBA1C 7.3 (A) 07/16/2022   HGBA1C 8.7 (A) 04/10/2022   HGBA1C 8.0 (H) 12/02/2021   HGBA1C 9.4 (A) 09/02/2021   HGBA1C 7.8 (A) 06/11/2021   HGBA1C 7.1 (H) 05/27/2021   Pt was on a regimen of: - NPH 45 units in a.m. Prev. On Lantus ,  Levemir , NovoLog , Humalog , Tradjenta , Metformin .  He is now on: - Lantus  20 units 2x a day >> 40 units in am as he was forgetting to bedtime dose >> 25-30 units  >> 30 units 2x a day - Humalog  U200 30 >> 15-25 >> 20 units 3x a day - Trulicity  1.5 >> 3 mg weekly  Pt checks his sugars >4x a day with the Libre CGM 3+:  Previously:  Lowest sugar was  59 >> 54 >> 45 >> 53; he has hypoglycemia awareness at 70.  Highest sugar was HI >> 300s >> 200s >> 272.  Glucometer: Accu-Chek guide  Meals: - Protein shake - Lunch  - skips >> protein shake >> baked potato - Dinner - potato cheddar bake (15 units) >> protein shake >> TV dinner  - + CKD-sees nephrology, last BUN/creatinine:  10/12/2023: 18/1.73, glucose 69 Lab Results  Component Value Date   BUN 21 07/28/2023   BUN 19 03/26/2023   CREATININE 1.63 (H) 07/28/2023   CREATININE 1.76 (H) 03/26/2023  On lisinopril .  - + HL; last set of lipids: Lab Results  Component Value Date   CHOL 121 09/24/2023   HDL 35 (L) 09/24/2023    LDLCALC 53 09/24/2023   LDLDIRECT 62.0 07/16/2022   TRIG 199 (H) 09/24/2023   CHOLHDL 3 07/16/2022  On Lipitor 10 mg daily Tricor  48 mg daily. He could not take fish oil 2/2 large capsule.  - last eye exam was on 10/08/2023. No DR.  - no numbness and tingling in his feet.  On Neurontin  300 mg twice a day.  Last foot exam by Dr. Gaynel 12/30/2023.   He also has a history of hypogonadism, HTN, back pain, bipolar disorder, PTSD, history of suicide attempt. He undergoes ECT treatments. He has a history of schizoaffective disorder and also poor memory.  He has an aide that helps him.  He gives his own insulin . He was admitted 11/2021 for pneumonia.  In 2023, he had a thyroid  ultrasound showing 3 nodules, of which do need to be followed with annual ultrasounds.  This is managed by PCP.  ROS: + see HPI  Past Medical History:  Diagnosis Date   ADD (attention deficit disorder)    Allergy    Anxiety    Arthritis    right hip   Bipolar 1 disorder (HCC)    Blood in urine    CKD (chronic kidney disease), stage III (HCC)  Creatinine elevation    Dementia (HCC)    early onset (08/04/2017)   Depression    bipolar [facility]   Diabetes mellitus without complication (HCC)    Dizziness    Family history of anesthesia complication    pt is unsure , but pt father may have been difficult to arouse    Frequent falls    HCAP (healthcare-associated pneumonia) 10/31/2012   History of kidney stones    Hypertension    Hypogonadism male    Liver fatty degeneration    Lumbar radiculopathy    Microscopic hematuria    hereditary s/p Urology eval   Neuromuscular disorder (HCC)    feet neuropathy    Osteoarthritis of right hip 11/28/2011   2012 2015 s/p THR Severe  Dr Loa     Pleural effusion 11/02/2012   Pneumonia 10/2012   Pneumonia, organism unspecified(486) 11/02/2012   Polysubstance dependence, non-opioid, in remission (HCC)    remote   Primary osteoarthritis of left hip  05/22/2015   PTSD (post-traumatic stress disorder)    SOCIAL ANXIETY DISORDER    Schizoaffective disorder (HCC)    Substance abuse (HCC)    Suicide attempt by multiple drug overdose (HCC) 01-01-16   Grieving his cat's death 10-Apr-2016   Past Surgical History:  Procedure Laterality Date   BACK SURGERY     CLOSED REDUCTION METACARPAL WITH PERCUTANEOUS PINNING Right    COLONOSCOPY  03/21/2020   LUMBAR DISC SURGERY     TONSILLECTOMY     TOTAL HIP ARTHROPLASTY Right 08/16/2013   Procedure: TOTAL HIP ARTHROPLASTY ANTERIOR APPROACH;  Surgeon: Maude KANDICE Herald, MD;  Location: [facility] OR;  Service: Orthopedics;  Laterality: Right;   TOTAL HIP ARTHROPLASTY Left 05/22/2015   Procedure: TOTAL HIP ARTHROPLASTY ANTERIOR APPROACH;  Surgeon: Maude Herald, MD;  Location: [facility] OR;  Service: Orthopedics;  Laterality: Left;   Social History   Socioeconomic History   Marital status: Single    Spouse name: Not on file   Number of children: 0   Years of education: 14   Highest education level: Some college, no degree  Occupational History   Occupation: disability  Tobacco Use   Smoking status: Former    Current packs/day: 0.00    Types: Cigarettes    Start date: 12/21/2018    Quit date: 03/22/2019    Years since quitting: 5.0   Smokeless tobacco: Never  Vaping Use   Vaping status: Never Used  Substance and Sexual Activity   Alcohol  use: Not Currently   Drug use: Not Currently    Types: Marijuana, Crack cocaine, Cocaine    Comment: hx of marijuana/cocaine/crack use but sober since 10-Apr-2024   Sexual activity: Not Currently  Other Topics Concern   Not on file  Social History Narrative   05/08/2021 lives alone, sister Luke helps with meds, he has some in home care, lived with sister until Nov 2020   Caffeine- sodas, amount  varies   regular exercise-no   Social Drivers of Health   Financial Resource Strain: Low Risk  (03/25/2024)   Overall Financial Resource Strain (CARDIA)    Difficulty of Paying  Living Expenses: Not hard at all  Food Insecurity: No Food Insecurity (03/25/2024)   Hunger Vital Sign    Worried About Running Out of Food in the Last Year: Never true    Ran Out of Food in the Last Year: Never true  Transportation Needs: Unmet Transportation Needs (03/25/2024)   PRAPARE - Transportation    Lack of  Transportation (Medical): Yes    Lack of Transportation (Non-Medical): Yes  Physical Activity: Inactive (03/25/2024)   Exercise Vital Sign    Days of Exercise per Week: 0 days    Minutes of Exercise per Session: Not on file  Stress: Stress Concern Present (03/25/2024)   [employer] of Occupational Health - Occupational Stress Questionnaire    Feeling of Stress: Very much  Social Connections: Socially Isolated (03/25/2024)   Social Connection and Isolation Panel    Frequency of Communication with Friends and Family: Once a week    Frequency of Social Gatherings with Friends and Family: Never    Attends Religious Services: Never    Database Administrator or Organizations: Yes    Attends Engineer, Structural: More than 4 times per year    Marital Status: Never married  Intimate Partner Violence: Not At Risk (11/04/2023)   Humiliation, Afraid, Rape, and Kick questionnaire    Fear of Current or Ex-Partner: No    Emotionally Abused: No    Physically Abused: No    Sexually Abused: No   Current Outpatient Medications on File Prior to Visit  Medication Sig Dispense Refill   acetaminophen  (TYLENOL ) 500 MG tablet Take 1 tablet (500 mg total) by mouth every 8 (eight) hours as needed. 90 tablet 1   alfuzosin  (UROXATRAL ) 10 MG 24 hr tablet Take 1 tablet (10 mg total) by mouth daily with breakfast. 90 tablet 1   ARIPiprazole  (ABILIFY ) 10 MG tablet Take 1 tablet (10 mg total) by mouth daily. 30 tablet 0   atorvastatin  (LIPITOR) 10 MG tablet Take 1 tablet (10 mg total) by mouth daily. 90 tablet 1   buPROPion  (WELLBUTRIN  XL) 300 MG 24 hr tablet SMARTSIG:1.0 Tablet(s) By Mouth  Daily     clonazePAM  (KLONOPIN ) 0.5 MG tablet Take by mouth.     Continuous Glucose Sensor (FREESTYLE LIBRE 3 PLUS SENSOR) MISC 1 each by Does not apply route every 14 (fourteen) days. 6 each 3   DROPLET PEN NEEDLES 31G X 5 MM MISC USE 4 TIMES DAILY 400 each 3   Dulaglutide  (TRULICITY ) 3 MG/0.5ML SOAJ INJECT 3 MG INTO THE SKIN ONCE A WEEK. 6 mL 3   fenofibrate  (TRICOR ) 48 MG tablet Take 1 tablet (48 mg total) by mouth daily. 90 tablet 1   fluticasone  (FLONASE ) 50 MCG/ACT nasal spray USE 1 SPRAY IN EACH NOSTRIL EVERY DAY 32 g 2   glucose blood (ACCU-CHEK GUIDE) test strip CHECK SUGAR FOUR TIMES DAILY 100 each 12   HUMALOG  KWIKPEN 200 UNIT/ML KwikPen INJECT 15 TO 20 UNITS UNDER THE SKIN 3 TIMES DAILY BEFORE MEALS. (DISCARD PEN 28 DAYS AFTER OPENING) (Patient taking differently: Take 30 units 3 times daily before meals) 30 mL 3   insulin  glargine (LANTUS  SOLOSTAR) 100 UNIT/ML Solostar Pen Inject 25-30 Units into the skin 2 (two) times daily. 45 mL 3   lidocaine  (LIDODERM ) 5 % Place 1 patch onto the skin daily. Remove & Discard patch within 12 hours or as directed by MD 30 patch 1   losartan  (COZAAR ) 25 MG tablet Take 1 tablet (25 mg total) by mouth daily. 90 tablet 1   Multiple Vitamins-Minerals (MULTIVITAMIN WITH MINERALS) tablet Take 1 tablet by mouth daily.     pantoprazole  (PROTONIX ) 40 MG tablet Take 1 tablet (40 mg total) by mouth daily. 90 tablet 1   polyethylene glycol powder (GLYCOLAX /MIRALAX ) 17 GM/SCOOP powder Take 17 g by mouth daily. 3350 g 1   traZODone  (DESYREL ) 100 MG  tablet Take 200 mg by mouth at bedtime as needed.     No current facility-administered medications on file prior to visit.   Allergies  Allergen Reactions   Egg Solids, Whole Nausea Only   Hydrocodone  Rash    Family History  Problem Relation Age of Onset   Diabetes Father    Cancer Mother        died of melanoma with mets   Cervical cancer Sister    Diabetes Sister    Other Neg Hx        hypogonadism    Colon cancer Neg Hx    Colon polyps Neg Hx    Esophageal cancer Neg Hx    Rectal cancer Neg Hx    Stomach cancer Neg Hx    PE: BP 120/70   Pulse 74   Ht 6' 4 (1.93 m)   Wt 246 lb 9.6 oz (111.9 kg)   SpO2 96%   BMI 30.02 kg/m  Wt Readings from Last 10 Encounters:  03/29/24 246 lb 9.6 oz (111.9 kg)  03/28/24 245 lb 9.6 oz (111.4 kg)  12/03/23 249 lb (112.9 kg)  11/04/23 230 lb (104.3 kg)  09/24/23 228 lb (103.4 kg)  08/03/23 217 lb 9.6 oz (98.7 kg)  07/28/23 220 lb 9.6 oz (100.1 kg)  04/03/23 228 lb 3.2 oz (103.5 kg)  03/30/23 230 lb (104.3 kg)  03/26/23 230 lb (104.3 kg)   Constitutional: overweight, in NAD, walks with a cane Eyes:  EOMI, no exophthalmos ENT: no neck masses, no cervical lymphadenopathy Cardiovascular: RRR, No MRG Respiratory: CTA B Musculoskeletal: no deformities Skin:no rashes Neurological: no tremor with outstretched hands  ASSESSMENT: 1. DM2, insulin -dependent, uncontrolled, with complications - CKD - PN - fatty liver  2. HL  PLAN:  1. Patient with longstanding, previously uncontrolled type 2 diabetes, on injectable antidiabetic regimen with basal-bolus insulin  and weekly GLP-1 receptor agonist, with improving control.  At last visit, HbA1c was excellent, at 5.4%, within normal range.  Sugars were fluctuating within the target range but with low blood sugars throughout the day and night.  Upon questioning, he was taking higher doses of insulin , much higher than recommended and we discussed about reducing the doses.  We continued the same Trulicity  dose.  He was frustrated about his weight and felt that Trulicity  was not working very well for him but I explained that after we would reduce the dose of insulin , he most likely would benefit from it more. I also recommended to vary the dose of Humalog  before meals as he was taking a fixed dose, which was conducive to fluctuations in blood sugars. CGM interpretation: -At today's visit, we reviewed his CGM  downloads: It appears that 88% of values are in target range (goal >70%), while 6% are higher than 180 (goal <25%), and 6% are lower than 70 (goal <4%).  The calculated average blood sugar is 127.  The projected HbA1c for the next 3 months (GMI) is 6.3%. -Reviewing the CGM trends, sugars appear to be well-controlled, fluctuating mainly within the target range, but with some low blood sugars throughout the day.  We will reduce the dose of his Lantus  and Humalog  further.  Also, he is still interested in changing from Trulicity  to Ozempic  due to this today.  I recommended to start with the 0.5 mg weekly dose.  If not covered by his insurance, will need to stay on Trulicity  or possibly change to Mounjaro. - I suggested to:  Patient Instructions  Please change  Trulicity  to: - Ozempic  0.5 mg  weekly  Decrease: - Lantus   25 units 2x a day - Humalog  U200:  15 min before meals  - 15 units before protein shakes or meals - 20 units before large meals  Please come back for a follow-up appointment in 3-4 months.   - we checked his HbA1c: 6.0% (higher, but still excellent). - advised to check sugars at different times of the day - 4x a day, rotating check times - advised for yearly eye exams >> he is UTD - return to clinic in 3-4 months  2. HL - Latest lipid panel was reviewed from 6 months ago: LDL at goal, triglycerides elevated, HDL low: Lab Results  Component Value Date   CHOL 121 09/24/2023   HDL 35 (L) 09/24/2023   LDLCALC 53 09/24/2023   LDLDIRECT 62.0 07/16/2022   TRIG 199 (H) 09/24/2023   CHOLHDL 3 07/16/2022  - He continues Lipitor 10 mg daily without side effects.  I previously also suggested fish oil 1000 mg twice a day but he felt that the capsule was too large.  He is also on Tricor  40 mg daily.  Lela Fendt, MD PhD [facility] Endocrinology

## 2024-03-29 NOTE — Addendum Note (Signed)
 Addended by: CLEOTILDE ROLIN RAMAN on: 03/29/2024 02:55 PM   Modules accepted: Orders

## 2024-03-29 NOTE — Patient Instructions (Addendum)
 Please change Trulicity  to: - Ozempic  0.5 mg  weekly  Decrease: - Lantus   25 units 2x a day - Humalog  U200:  15 min before meals  - 15 units before protein shakes or meals - 20 units before large meals  Please come back for a follow-up appointment in 3-4 months.

## 2024-03-30 ENCOUNTER — Ambulatory Visit: Admitting: Podiatry

## 2024-03-30 ENCOUNTER — Encounter: Payer: Self-pay | Admitting: Podiatry

## 2024-03-30 DIAGNOSIS — B351 Tinea unguium: Secondary | ICD-10-CM | POA: Diagnosis not present

## 2024-03-30 DIAGNOSIS — M79675 Pain in left toe(s): Secondary | ICD-10-CM | POA: Diagnosis not present

## 2024-03-30 DIAGNOSIS — E1142 Type 2 diabetes mellitus with diabetic polyneuropathy: Secondary | ICD-10-CM | POA: Diagnosis not present

## 2024-03-30 DIAGNOSIS — M79674 Pain in right toe(s): Secondary | ICD-10-CM | POA: Diagnosis not present

## 2024-04-04 ENCOUNTER — Ambulatory Visit: Payer: Self-pay | Admitting: Family Medicine

## 2024-04-07 ENCOUNTER — Other Ambulatory Visit: Payer: Self-pay

## 2024-04-07 DIAGNOSIS — E1122 Type 2 diabetes mellitus with diabetic chronic kidney disease: Secondary | ICD-10-CM

## 2024-04-07 MED ORDER — TRUE METRIX AIR GLUCOSE METER W/DEVICE KIT: PACK | 0 refills | Status: AC

## 2024-04-07 NOTE — Progress Notes (Signed)
°  Subjective:  Patient ID: Jonathan ONEIDA Sherrine Mickey., male    DOB: 06-07-1965,  MRN: [id number]  Jonathan ONEIDA Sherrine Mickey. presents to clinic today for at risk foot care with history of diabetic neuropathy and painful thick toenails that are difficult to trim. Pain interferes with ambulation. Aggravating factors include wearing enclosed shoe gear. Pain is relieved with periodic professional debridement.   New problem(s): None.   PCP is Delbert Clam, MD. ARNETTA 09/24/23.  Allergies[1]  Review of Systems: Negative except as noted in the HPI.  Objective: No changes noted in today's physical examination. There were no vitals filed for this visit. Jonathan Messer. is a pleasant 58 y.o. male in NAD. AAO x 3.  Vascular Examination: Capillary refill time immediate b/l. Palpable pedal pulses. No pain with calf compression b/l. Skin temperature gradient WNL b/l. No cyanosis or clubbing b/l. No ischemia or gangrene noted b/l. Pedal hair sparse. No edema noted b/l LE.  Neurological Examination: Sensation grossly intact b/l with 10 gram monofilament. Vibratory sensation intact b/l. Pt has subjective symptoms of neuropathy.  Dermatological Examination: Patient has healing abrasions dorsal right 2nd and 3rd digits. No erythema, no edema, no drainage, no fluctuance.  Pedal skin with normal turgor, texture and tone b/l. No interdigital macerations.   Toenails 1-5 b/l thick, discolored, elongated with subungual debris and pain on dorsal palpation.   No corns, calluses nor porokeratotic lesions noted.  Musculoskeletal Examination: Muscle strength 5/5 to all lower extremity muscle groups bilaterally. Hammertoe(s) 2-5 b/l.   Radiographs: None  Assessment/Plan: 1. Pain due to onychomycosis of toenails of both feet   2. Diabetic peripheral neuropathy associated with type 2 diabetes mellitus (HCC)   Patient was evaluated and treated. All patient's and/or POA's questions/concerns addressed on today's visit. Toenails  1-5 b/l debrided in length and girth without incident. Continue foot and shoe inspections daily. Monitor blood glucose per PCP/Endocrinologist's recommendations. Continue soft, supportive shoe gear daily. Report any pedal injuries to medical professional. Call office if there are any questions/concerns. -Patient/POA to call should there be question/concern in the interim.   Return in about 3 months (around 06/28/2024).  [name], DPM      [place] LOCATION: [address], [place] [address]                   Office [phone]   [place] LOCATION: [address] [place], [place] [address] Office [phone]     [1]  Allergies Allergen Reactions   Acetaminophen     Egg Solids, Whole Nausea Only   Hydrocodone  Rash

## 2024-04-26 LAB — LAB REPORT - SCANNED: EGFR: 46

## 2024-05-11 ENCOUNTER — Ambulatory Visit
Admission: RE | Admit: 2024-05-11 | Discharge: 2024-05-11 | Disposition: A | Source: Ambulatory Visit | Attending: Urology | Admitting: Urology

## 2024-05-11 DIAGNOSIS — R972 Elevated prostate specific antigen [PSA]: Secondary | ICD-10-CM

## 2024-05-11 MED ORDER — GADOPICLENOL 0.5 MMOL/ML IV SOLN
10.0000 mL | Freq: Once | INTRAVENOUS | Status: AC | PRN
  Administered 2024-05-11: 10 mL via INTRAVENOUS

## 2024-06-28 ENCOUNTER — Ambulatory Visit: Admitting: Podiatry

## 2024-07-14 ENCOUNTER — Ambulatory Visit: Admitting: Internal Medicine

## 2024-09-26 ENCOUNTER — Ambulatory Visit: Admitting: Family Medicine

## 2024-09-28 ENCOUNTER — Ambulatory Visit: Admitting: Podiatry

## 2024-11-08 ENCOUNTER — Ambulatory Visit
